# Patient Record
Sex: Female | Born: 1970 | Race: White | Hispanic: No | Marital: Single | Smoking: Former smoker
Health system: Southern US, Community
[De-identification: ages and names within clinical notes are randomized; demographics above are authoritative.]

## PROBLEM LIST (undated history)

## (undated) DIAGNOSIS — J9621 Acute and chronic respiratory failure with hypoxia: Secondary | ICD-10-CM

## (undated) DIAGNOSIS — G8254 Quadriplegia, C5-C7 incomplete: Secondary | ICD-10-CM

## (undated) DIAGNOSIS — F1920 Other psychoactive substance dependence, uncomplicated: Secondary | ICD-10-CM

## (undated) DIAGNOSIS — L89300 Pressure ulcer of unspecified buttock, unstageable: Secondary | ICD-10-CM

## (undated) DIAGNOSIS — Z9189 Other specified personal risk factors, not elsewhere classified: Secondary | ICD-10-CM

## (undated) DIAGNOSIS — F32A Depression, unspecified: Secondary | ICD-10-CM

## (undated) DIAGNOSIS — F1490 Cocaine use, unspecified, uncomplicated: Secondary | ICD-10-CM

## (undated) DIAGNOSIS — F1120 Opioid dependence, uncomplicated: Secondary | ICD-10-CM

## (undated) DIAGNOSIS — Z930 Tracheostomy status: Secondary | ICD-10-CM

## (undated) DIAGNOSIS — L89154 Pressure ulcer of sacral region, stage 4: Secondary | ICD-10-CM

## (undated) DIAGNOSIS — R601 Generalized edema: Secondary | ICD-10-CM

## (undated) DIAGNOSIS — K219 Gastro-esophageal reflux disease without esophagitis: Secondary | ICD-10-CM

## (undated) DIAGNOSIS — Z950 Presence of cardiac pacemaker: Secondary | ICD-10-CM

## (undated) DIAGNOSIS — R091 Pleurisy: Secondary | ICD-10-CM

## (undated) DIAGNOSIS — K759 Inflammatory liver disease, unspecified: Secondary | ICD-10-CM

## (undated) DIAGNOSIS — G8250 Quadriplegia, unspecified: Secondary | ICD-10-CM

## (undated) DIAGNOSIS — F329 Major depressive disorder, single episode, unspecified: Secondary | ICD-10-CM

## (undated) DIAGNOSIS — E46 Unspecified protein-calorie malnutrition: Secondary | ICD-10-CM

## (undated) DIAGNOSIS — J181 Lobar pneumonia, unspecified organism: Secondary | ICD-10-CM

## (undated) DIAGNOSIS — E119 Type 2 diabetes mellitus without complications: Secondary | ICD-10-CM

## (undated) DIAGNOSIS — J189 Pneumonia, unspecified organism: Secondary | ICD-10-CM

## (undated) DIAGNOSIS — T40601A Poisoning by unspecified narcotics, accidental (unintentional), initial encounter: Secondary | ICD-10-CM

## (undated) DIAGNOSIS — N200 Calculus of kidney: Secondary | ICD-10-CM

## (undated) HISTORY — DX: Poisoning by unspecified narcotics, accidental (unintentional), initial encounter: T40.601A

## (undated) HISTORY — PX: TRACHEOSTOMY: SUR1362

## (undated) HISTORY — DX: Cocaine use, unspecified, uncomplicated: F14.90

## (undated) HISTORY — DX: Calculus of kidney: N20.0

## (undated) HISTORY — DX: Opioid dependence, uncomplicated: F11.20

## (undated) HISTORY — DX: Generalized edema: R60.1

## (undated) HISTORY — PX: APPENDECTOMY: SHX54

## (undated) HISTORY — PX: CARDIAC SURGERY: SHX584

## (undated) HISTORY — DX: Other psychoactive substance dependence, uncomplicated: F19.20

---

## 1997-10-05 ENCOUNTER — Other Ambulatory Visit: Admission: RE | Admit: 1997-10-05 | Discharge: 1997-10-05 | Payer: Self-pay | Admitting: Gynecology

## 1998-10-07 ENCOUNTER — Inpatient Hospital Stay (HOSPITAL_COMMUNITY): Admission: EM | Admit: 1998-10-07 | Discharge: 1998-10-09 | Payer: Self-pay

## 1998-10-08 ENCOUNTER — Encounter: Payer: Self-pay | Admitting: Internal Medicine

## 1998-12-27 ENCOUNTER — Other Ambulatory Visit: Admission: RE | Admit: 1998-12-27 | Discharge: 1998-12-27 | Payer: Self-pay | Admitting: Gynecology

## 1999-12-23 ENCOUNTER — Other Ambulatory Visit: Admission: RE | Admit: 1999-12-23 | Discharge: 1999-12-23 | Payer: Self-pay | Admitting: Gynecology

## 2000-01-06 ENCOUNTER — Emergency Department (HOSPITAL_COMMUNITY): Admission: EM | Admit: 2000-01-06 | Discharge: 2000-01-06 | Payer: Self-pay | Admitting: Emergency Medicine

## 2000-01-06 ENCOUNTER — Encounter: Payer: Self-pay | Admitting: Emergency Medicine

## 2000-09-09 ENCOUNTER — Inpatient Hospital Stay (HOSPITAL_COMMUNITY): Admission: AD | Admit: 2000-09-09 | Discharge: 2000-09-09 | Payer: Self-pay | Admitting: Obstetrics and Gynecology

## 2000-10-05 ENCOUNTER — Inpatient Hospital Stay (HOSPITAL_COMMUNITY): Admission: AD | Admit: 2000-10-05 | Discharge: 2000-10-05 | Payer: Self-pay | Admitting: Obstetrics and Gynecology

## 2000-10-23 ENCOUNTER — Emergency Department (HOSPITAL_COMMUNITY): Admission: EM | Admit: 2000-10-23 | Discharge: 2000-10-23 | Payer: Self-pay | Admitting: Emergency Medicine

## 2000-11-23 ENCOUNTER — Emergency Department (HOSPITAL_COMMUNITY): Admission: EM | Admit: 2000-11-23 | Discharge: 2000-11-23 | Payer: Self-pay | Admitting: Emergency Medicine

## 2000-12-29 ENCOUNTER — Inpatient Hospital Stay (HOSPITAL_COMMUNITY): Admission: AD | Admit: 2000-12-29 | Discharge: 2001-01-01 | Payer: Self-pay | Admitting: Obstetrics & Gynecology

## 2001-02-09 ENCOUNTER — Other Ambulatory Visit: Admission: RE | Admit: 2001-02-09 | Discharge: 2001-02-09 | Payer: Self-pay | Admitting: Obstetrics & Gynecology

## 2001-03-30 ENCOUNTER — Emergency Department (HOSPITAL_COMMUNITY): Admission: EM | Admit: 2001-03-30 | Discharge: 2001-03-30 | Payer: Self-pay

## 2001-07-29 ENCOUNTER — Encounter: Payer: Self-pay | Admitting: Emergency Medicine

## 2001-07-29 ENCOUNTER — Emergency Department (HOSPITAL_COMMUNITY): Admission: EM | Admit: 2001-07-29 | Discharge: 2001-07-30 | Payer: Self-pay | Admitting: Emergency Medicine

## 2001-08-27 ENCOUNTER — Emergency Department (HOSPITAL_COMMUNITY): Admission: EM | Admit: 2001-08-27 | Discharge: 2001-08-27 | Payer: Self-pay | Admitting: Emergency Medicine

## 2001-09-29 ENCOUNTER — Emergency Department (HOSPITAL_COMMUNITY): Admission: EM | Admit: 2001-09-29 | Discharge: 2001-09-29 | Payer: Self-pay | Admitting: Emergency Medicine

## 2001-11-20 ENCOUNTER — Encounter: Payer: Self-pay | Admitting: Emergency Medicine

## 2001-11-20 ENCOUNTER — Emergency Department (HOSPITAL_COMMUNITY): Admission: EM | Admit: 2001-11-20 | Discharge: 2001-11-20 | Payer: Self-pay | Admitting: Emergency Medicine

## 2002-01-16 ENCOUNTER — Emergency Department (HOSPITAL_COMMUNITY): Admission: EM | Admit: 2002-01-16 | Discharge: 2002-01-16 | Payer: Self-pay | Admitting: Emergency Medicine

## 2002-01-26 ENCOUNTER — Emergency Department (HOSPITAL_COMMUNITY): Admission: EM | Admit: 2002-01-26 | Discharge: 2002-01-26 | Payer: Self-pay | Admitting: Emergency Medicine

## 2002-02-21 ENCOUNTER — Emergency Department (HOSPITAL_COMMUNITY): Admission: EM | Admit: 2002-02-21 | Discharge: 2002-02-21 | Payer: Self-pay | Admitting: Emergency Medicine

## 2002-03-24 ENCOUNTER — Encounter: Payer: Self-pay | Admitting: Emergency Medicine

## 2002-03-24 ENCOUNTER — Emergency Department (HOSPITAL_COMMUNITY): Admission: EM | Admit: 2002-03-24 | Discharge: 2002-03-24 | Payer: Self-pay | Admitting: Emergency Medicine

## 2002-05-22 ENCOUNTER — Emergency Department (HOSPITAL_COMMUNITY): Admission: EM | Admit: 2002-05-22 | Discharge: 2002-05-22 | Payer: Self-pay | Admitting: Emergency Medicine

## 2002-05-22 ENCOUNTER — Encounter: Payer: Self-pay | Admitting: Emergency Medicine

## 2002-06-17 ENCOUNTER — Emergency Department (HOSPITAL_COMMUNITY): Admission: EM | Admit: 2002-06-17 | Discharge: 2002-06-17 | Payer: Self-pay | Admitting: Emergency Medicine

## 2002-08-07 ENCOUNTER — Encounter: Payer: Self-pay | Admitting: Emergency Medicine

## 2002-08-07 ENCOUNTER — Emergency Department (HOSPITAL_COMMUNITY): Admission: EM | Admit: 2002-08-07 | Discharge: 2002-08-07 | Payer: Self-pay | Admitting: Emergency Medicine

## 2002-10-16 ENCOUNTER — Emergency Department (HOSPITAL_COMMUNITY): Admission: EM | Admit: 2002-10-16 | Discharge: 2002-10-16 | Payer: Self-pay | Admitting: Emergency Medicine

## 2002-12-28 ENCOUNTER — Emergency Department (HOSPITAL_COMMUNITY): Admission: EM | Admit: 2002-12-28 | Discharge: 2002-12-29 | Payer: Self-pay | Admitting: Emergency Medicine

## 2003-01-27 ENCOUNTER — Emergency Department (HOSPITAL_COMMUNITY): Admission: AD | Admit: 2003-01-27 | Discharge: 2003-01-27 | Payer: Self-pay | Admitting: Family Medicine

## 2003-02-02 ENCOUNTER — Emergency Department (HOSPITAL_COMMUNITY): Admission: AD | Admit: 2003-02-02 | Discharge: 2003-02-02 | Payer: Self-pay | Admitting: Family Medicine

## 2003-03-30 ENCOUNTER — Emergency Department (HOSPITAL_COMMUNITY): Admission: EM | Admit: 2003-03-30 | Discharge: 2003-03-30 | Payer: Self-pay | Admitting: Emergency Medicine

## 2003-04-05 ENCOUNTER — Emergency Department (HOSPITAL_COMMUNITY): Admission: EM | Admit: 2003-04-05 | Discharge: 2003-04-05 | Payer: Self-pay | Admitting: Family Medicine

## 2003-05-04 ENCOUNTER — Emergency Department (HOSPITAL_COMMUNITY): Admission: EM | Admit: 2003-05-04 | Discharge: 2003-05-04 | Payer: Self-pay | Admitting: Family Medicine

## 2003-05-14 ENCOUNTER — Emergency Department (HOSPITAL_COMMUNITY): Admission: EM | Admit: 2003-05-14 | Discharge: 2003-05-14 | Payer: Self-pay | Admitting: Emergency Medicine

## 2003-05-28 ENCOUNTER — Emergency Department (HOSPITAL_COMMUNITY): Admission: EM | Admit: 2003-05-28 | Discharge: 2003-05-28 | Payer: Self-pay | Admitting: Family Medicine

## 2003-05-31 ENCOUNTER — Inpatient Hospital Stay (HOSPITAL_COMMUNITY): Admission: EM | Admit: 2003-05-31 | Discharge: 2003-06-09 | Payer: Self-pay | Admitting: Emergency Medicine

## 2003-06-20 ENCOUNTER — Emergency Department (HOSPITAL_COMMUNITY): Admission: EM | Admit: 2003-06-20 | Discharge: 2003-06-20 | Payer: Self-pay | Admitting: Emergency Medicine

## 2003-06-27 ENCOUNTER — Emergency Department (HOSPITAL_COMMUNITY): Admission: EM | Admit: 2003-06-27 | Discharge: 2003-06-27 | Payer: Self-pay | Admitting: Emergency Medicine

## 2003-07-10 ENCOUNTER — Emergency Department (HOSPITAL_COMMUNITY): Admission: EM | Admit: 2003-07-10 | Discharge: 2003-07-11 | Payer: Self-pay | Admitting: Emergency Medicine

## 2003-08-06 ENCOUNTER — Emergency Department (HOSPITAL_COMMUNITY): Admission: EM | Admit: 2003-08-06 | Discharge: 2003-08-06 | Payer: Self-pay | Admitting: Emergency Medicine

## 2003-09-17 ENCOUNTER — Emergency Department (HOSPITAL_COMMUNITY): Admission: EM | Admit: 2003-09-17 | Discharge: 2003-09-17 | Payer: Self-pay | Admitting: Emergency Medicine

## 2003-10-01 ENCOUNTER — Emergency Department (HOSPITAL_COMMUNITY): Admission: EM | Admit: 2003-10-01 | Discharge: 2003-10-01 | Payer: Self-pay | Admitting: Emergency Medicine

## 2004-02-07 ENCOUNTER — Emergency Department (HOSPITAL_COMMUNITY): Admission: EM | Admit: 2004-02-07 | Discharge: 2004-02-07 | Payer: Self-pay | Admitting: Emergency Medicine

## 2004-03-06 ENCOUNTER — Emergency Department (HOSPITAL_COMMUNITY): Admission: EM | Admit: 2004-03-06 | Discharge: 2004-03-06 | Payer: Self-pay | Admitting: Emergency Medicine

## 2004-04-02 ENCOUNTER — Emergency Department (HOSPITAL_COMMUNITY): Admission: EM | Admit: 2004-04-02 | Discharge: 2004-04-02 | Payer: Self-pay | Admitting: Emergency Medicine

## 2004-06-25 ENCOUNTER — Emergency Department (HOSPITAL_COMMUNITY): Admission: EM | Admit: 2004-06-25 | Discharge: 2004-06-25 | Payer: Self-pay | Admitting: Emergency Medicine

## 2004-07-01 ENCOUNTER — Emergency Department (HOSPITAL_COMMUNITY): Admission: EM | Admit: 2004-07-01 | Discharge: 2004-07-01 | Payer: Self-pay | Admitting: Emergency Medicine

## 2004-09-09 ENCOUNTER — Emergency Department (HOSPITAL_COMMUNITY): Admission: EM | Admit: 2004-09-09 | Discharge: 2004-09-09 | Payer: Self-pay | Admitting: Emergency Medicine

## 2004-10-21 ENCOUNTER — Emergency Department (HOSPITAL_COMMUNITY): Admission: EM | Admit: 2004-10-21 | Discharge: 2004-10-21 | Payer: Self-pay | Admitting: Emergency Medicine

## 2004-11-13 ENCOUNTER — Emergency Department (HOSPITAL_COMMUNITY): Admission: EM | Admit: 2004-11-13 | Discharge: 2004-11-13 | Payer: Self-pay | Admitting: Emergency Medicine

## 2005-03-20 ENCOUNTER — Emergency Department (HOSPITAL_COMMUNITY): Admission: EM | Admit: 2005-03-20 | Discharge: 2005-03-20 | Payer: Self-pay | Admitting: Emergency Medicine

## 2005-06-08 ENCOUNTER — Emergency Department (HOSPITAL_COMMUNITY): Admission: EM | Admit: 2005-06-08 | Discharge: 2005-06-08 | Payer: Self-pay | Admitting: *Deleted

## 2005-07-04 IMAGING — CR DG CHEST 2V
2 series · 2 of 2 positions shown · non-contrast
Comparison: none

CLINICAL DATA: Cough.  Smoking history.
 CHEST, TWO VIEWS 
 The heart and mediastinum are normal.  There are patchy infiltrates in both lower lobes, the right middle lobe and the lingula.  This is consistent with pneumonia.  There is no dense consolidation, collapse or effusion.  The bony structures are unremarkable.

[view not recorded (1 of 2)]
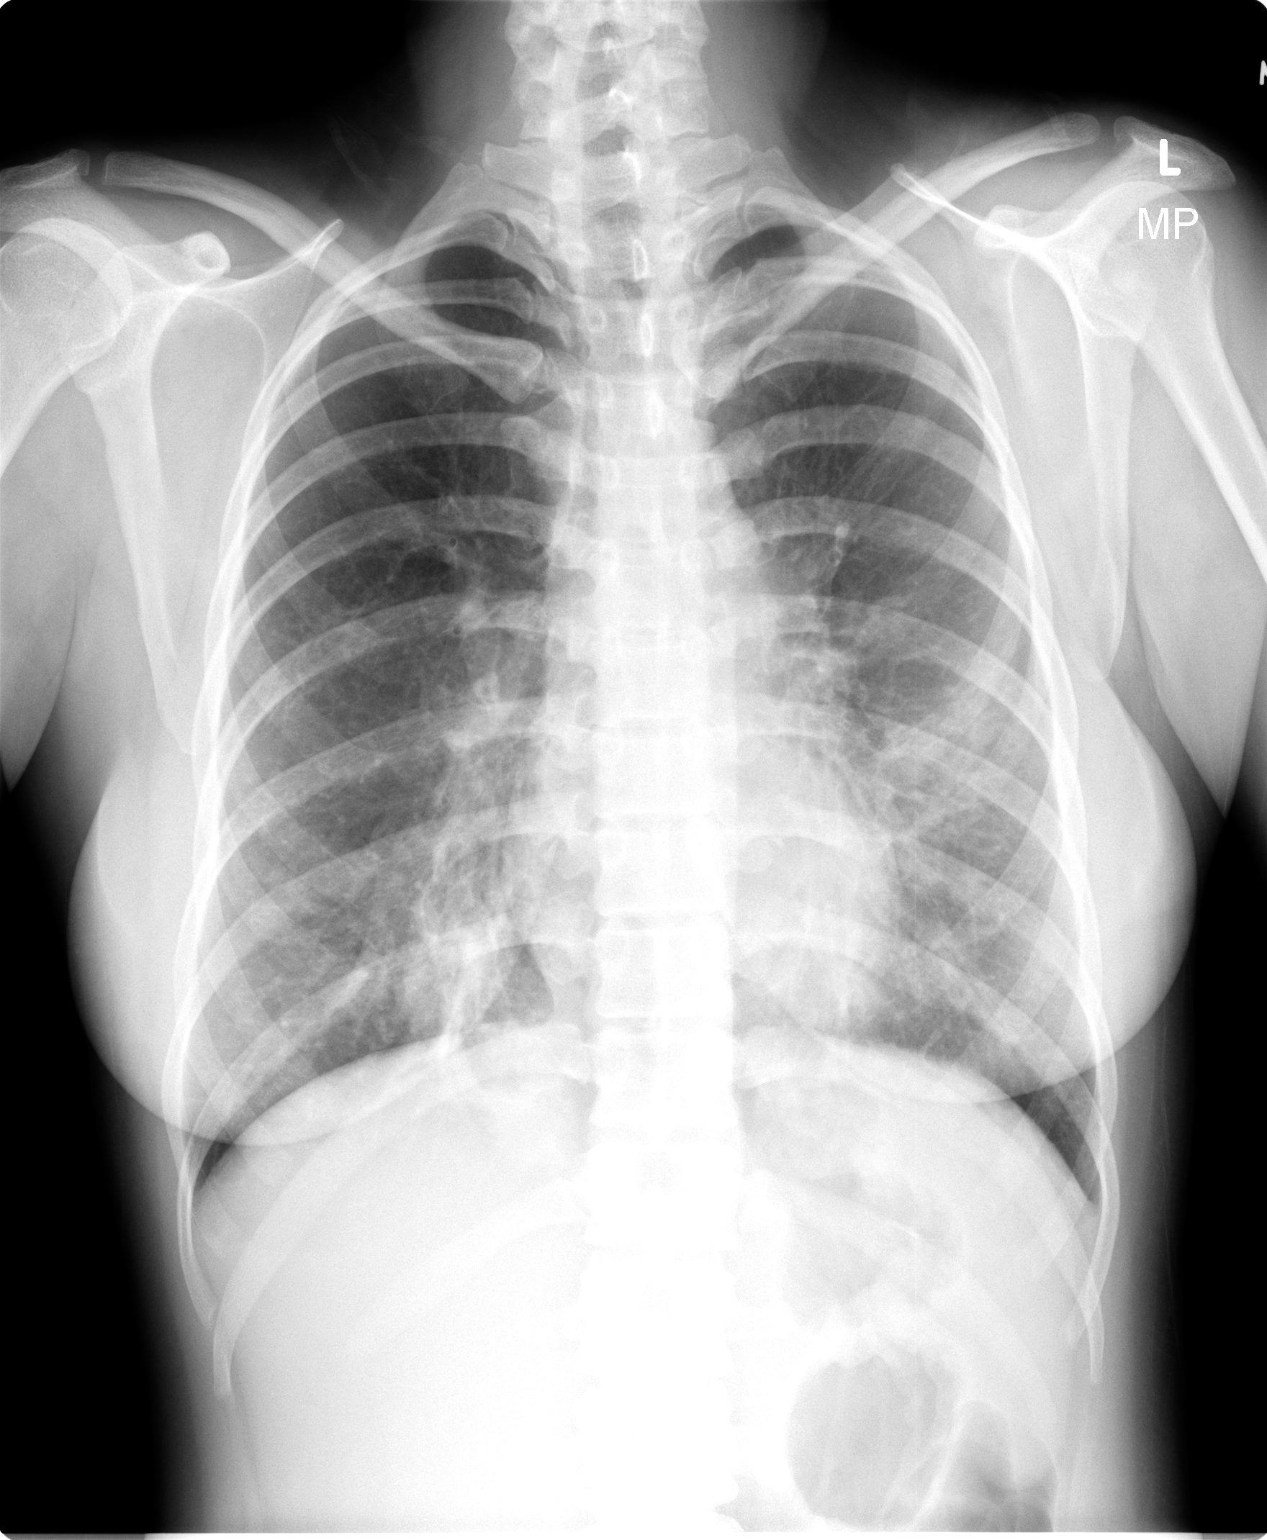

[view not recorded (2 of 2)]
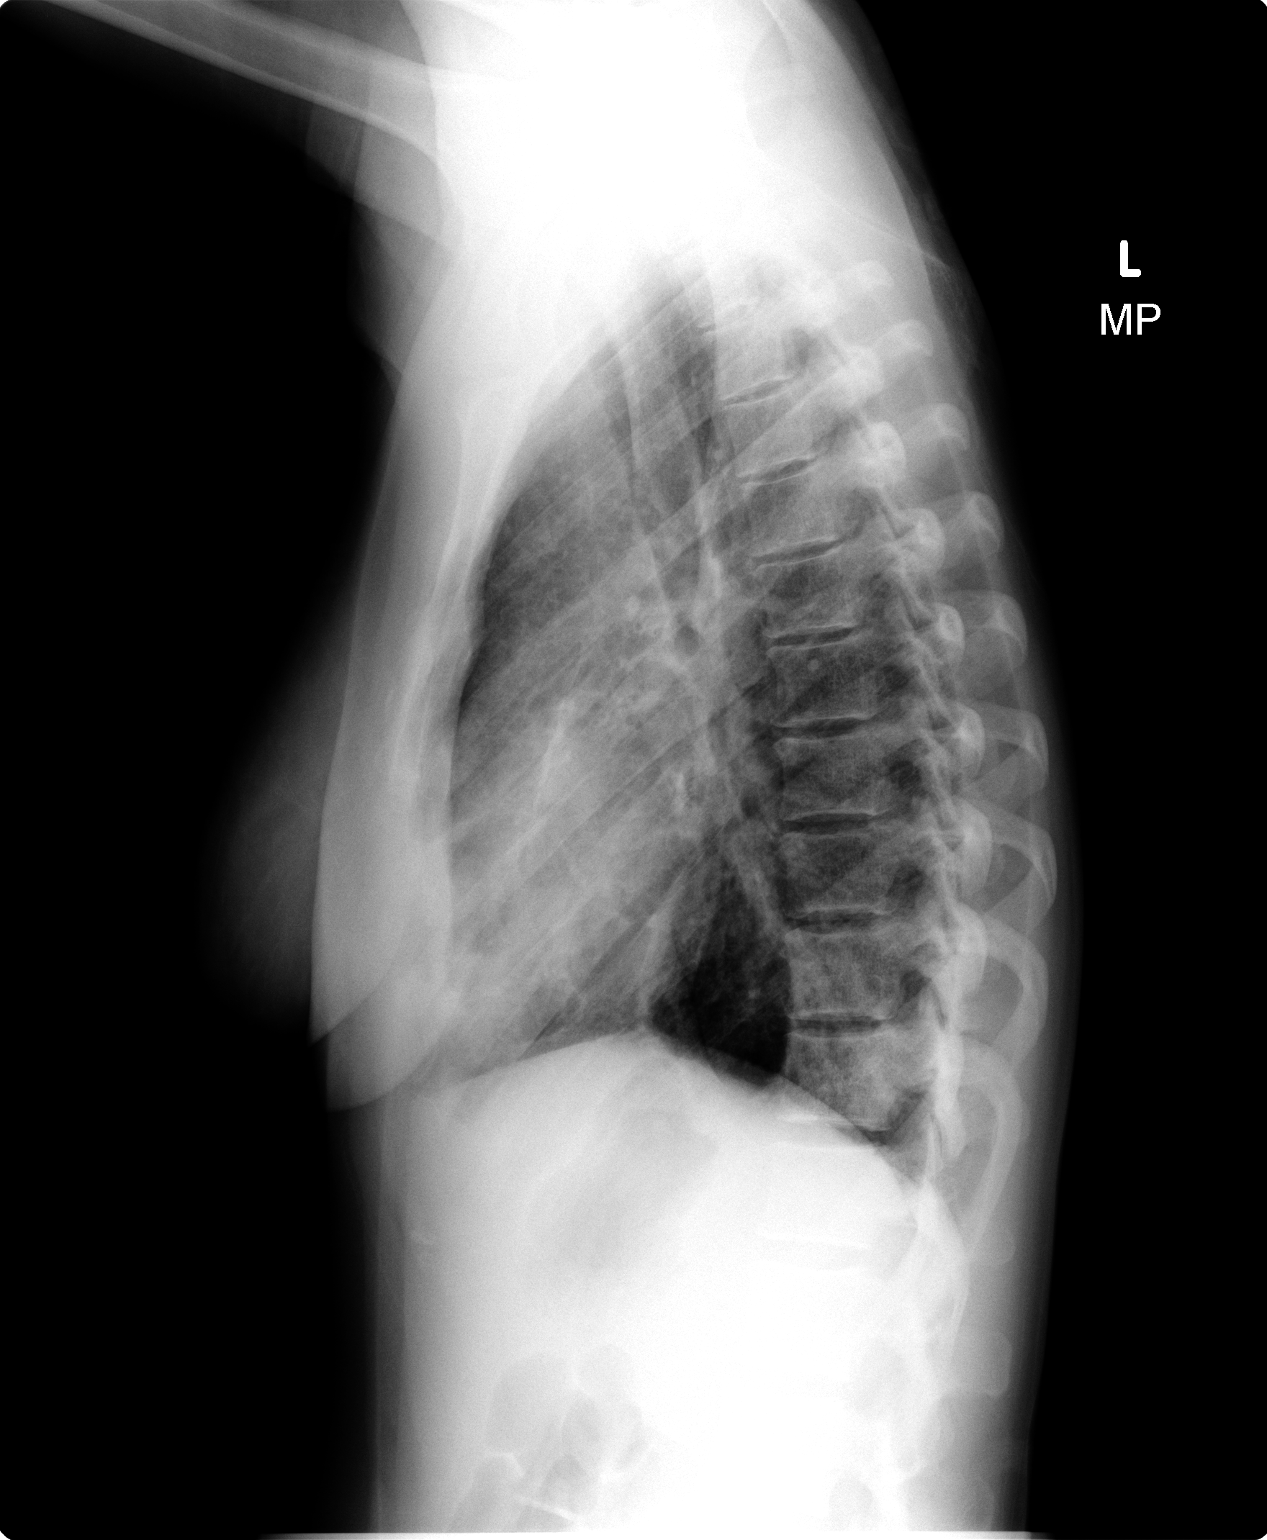

[2 of 2 positions shown; findings below may reference images not displayed]

IMPRESSION: Bilateral lower lung pneumonia.

## 2005-07-06 IMAGING — CT CT ANGIO CHEST
1 of 3 series · 19 of 32 positions shown · IV contrast (omnipaque)
Comparison: none

CLINICAL DATA: Persistent left-sided chest pain, pneumonia, and asthma.
 CHEST CT ANGIO WITH CONTRAST
 Multidetector CT imaging of the chest was performed according to the protocol for detection of pulmonary embolism during IV bolus injection of 150 ml Omnipaque 300.  Coronal and sagittal plane reformatted images were also generated.  
 The pulmonary arteries are normal throughout.  There is no bone abnormality.  There is no hilar or mediastinal adenopathy.  The upper abdomen is normal.  There are bilateral small patchy areas of infiltrate involving all lobes.  There is some atelectasis and consolidation of the medial segment of the right middle lobe and the lingula.  There is also some considerable atelectasis with some surrounding infiltrate involving the posterior aspect of the right lower lobe.  There is no pleural effusion.
 IMPRESSION
 1.  No evidence for pulmonary embolic disease.
 2.  Bilateral areas of atelectasis and infiltrate as described.

[Series 4: chest/pe 1.0 b10f · axial · 0.58mm/px · z∈[-185,+57]mm · 19 of 533 slices shown]
[im 25/533  lung]
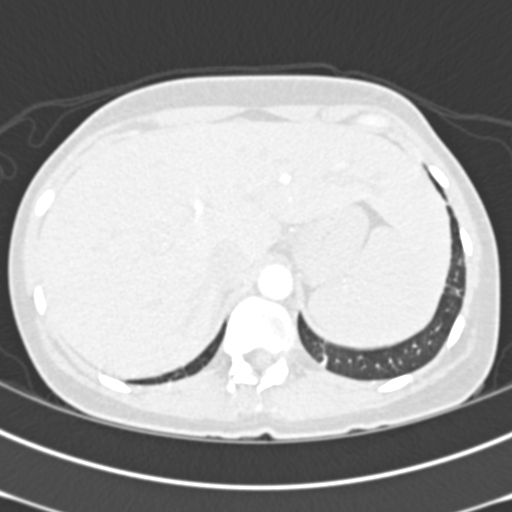
[im 49/533  mediastinal]
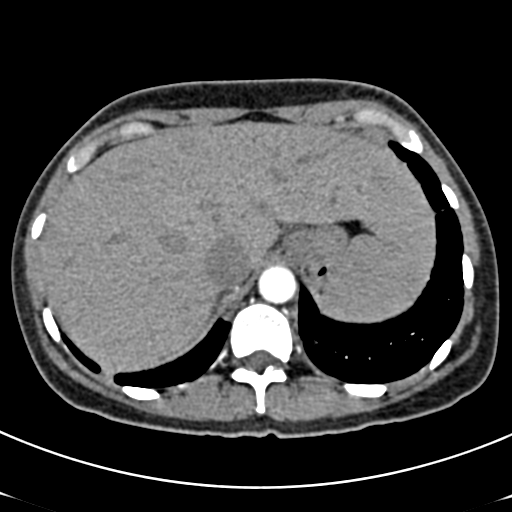
[im 97/533  lung]
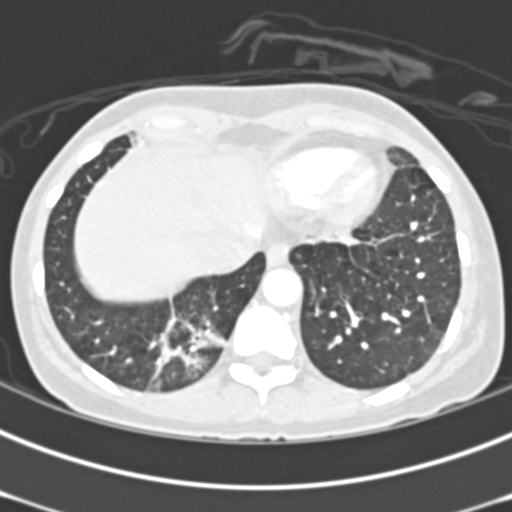
[im 121/533  mediastinal]
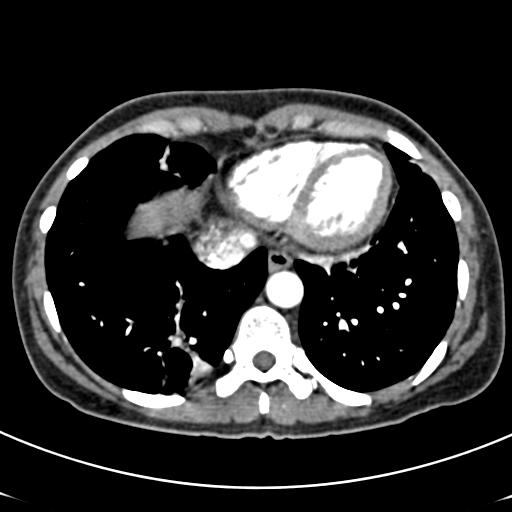
[im 146/533  lung]
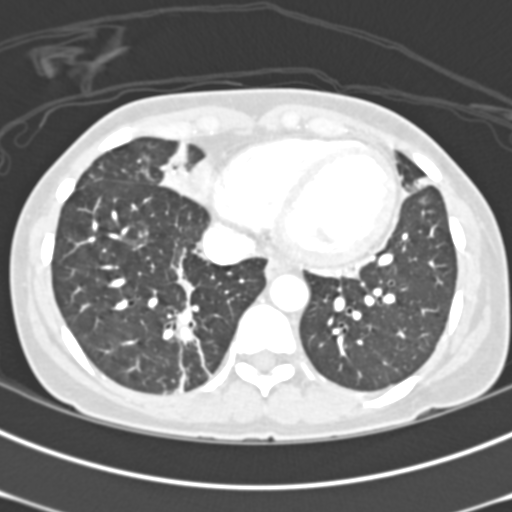
[im 170/533  mediastinal]
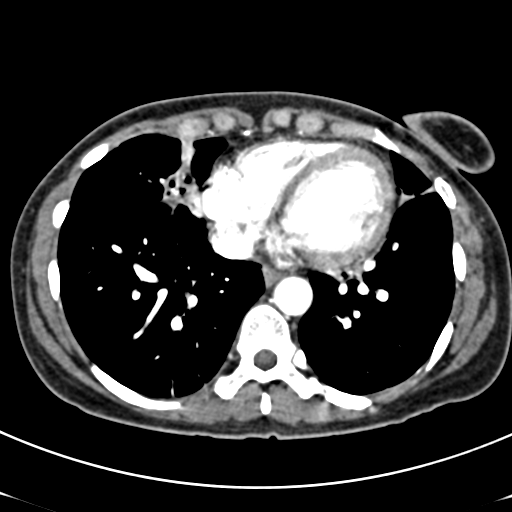
[im 194/533  lung]
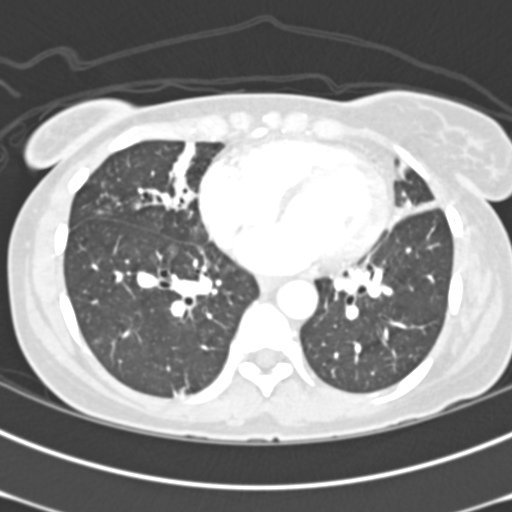
[im 209/533  mediastinal]
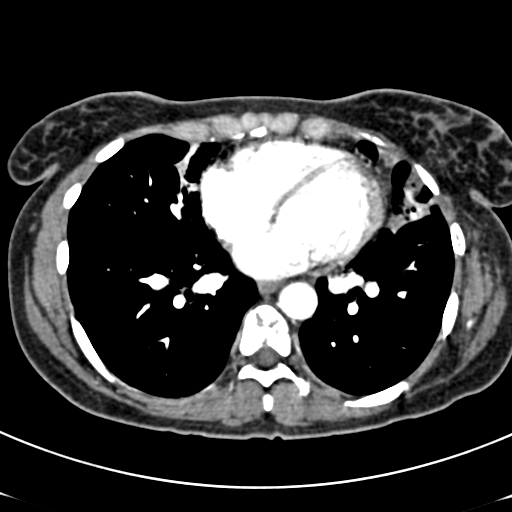
[im 218/533  lung]
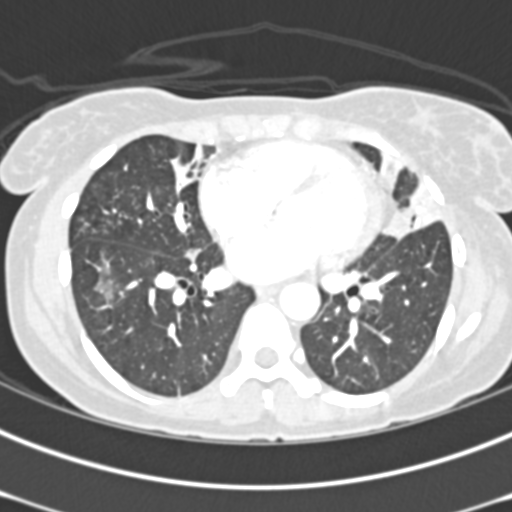
[im 267/533  mediastinal]
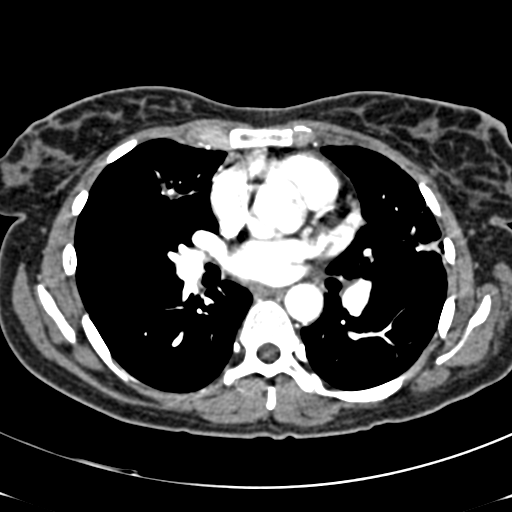
[im 291/533  lung]
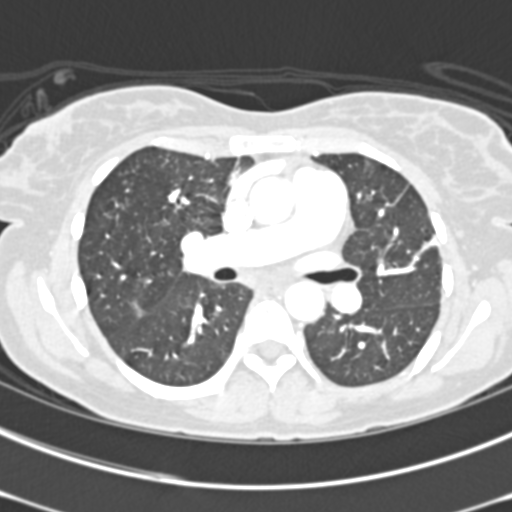
[im 315/533  mediastinal]
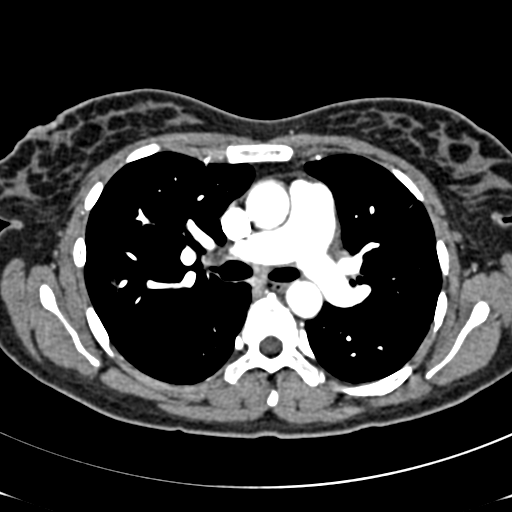
[im 339/533  lung]
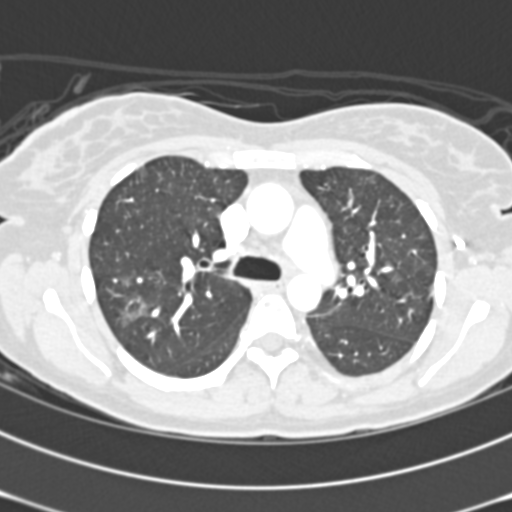
[im 363/533  mediastinal]
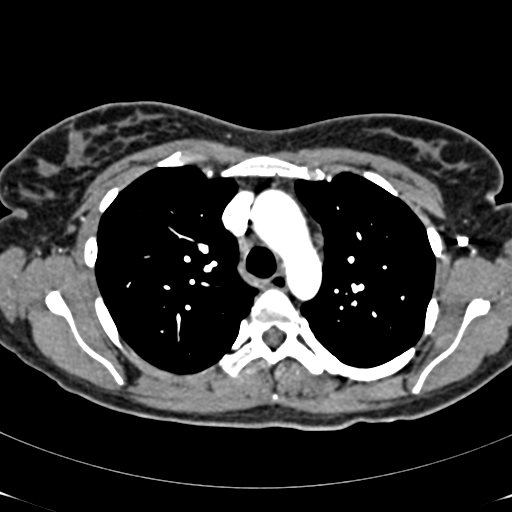
[im 387/533  lung]
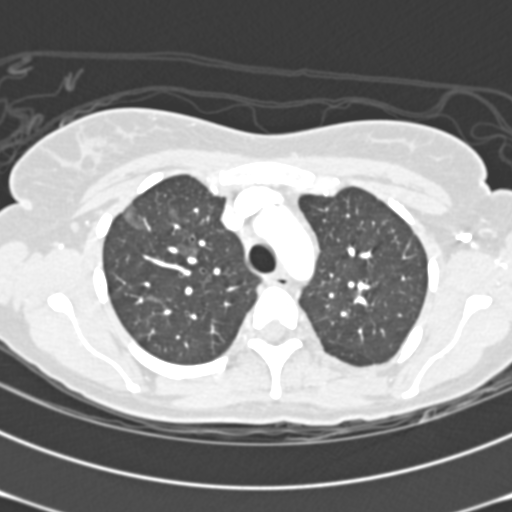
[im 412/533  mediastinal]
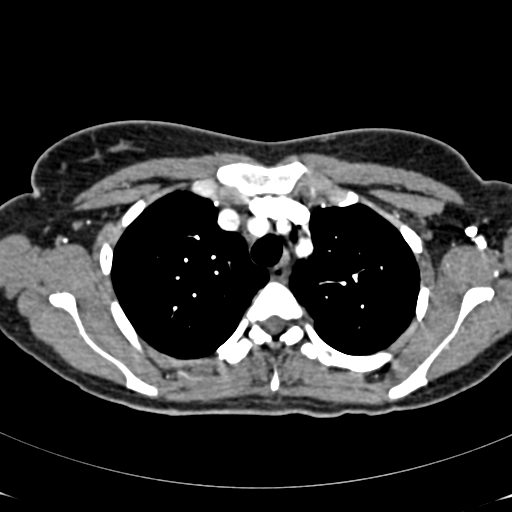
[im 436/533  lung]
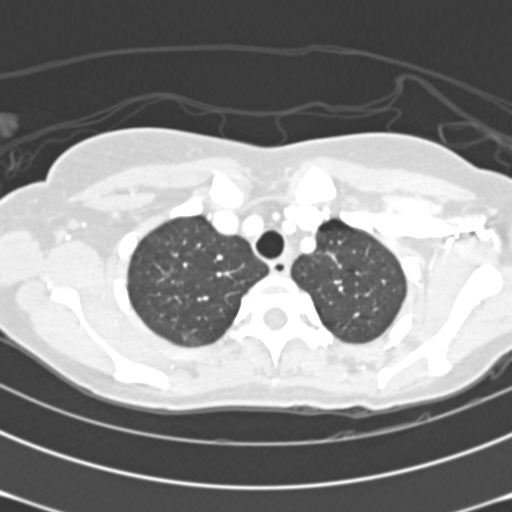
[im 484/533  mediastinal]
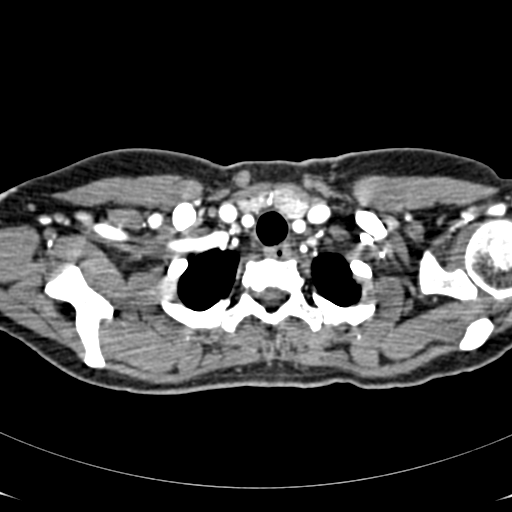
[im 508/533  lung]
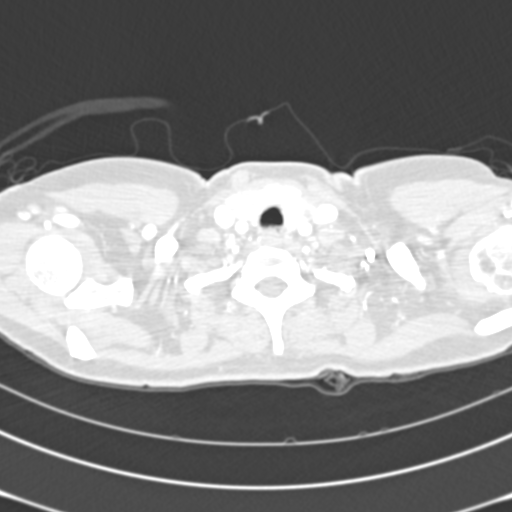

[19 of 32 positions shown; findings below may reference images not displayed]

## 2005-07-07 IMAGING — CR DG CHEST 2V
2 series · 2 of 2 positions shown · non-contrast
Comparison: CT chest yesterday and two view chest 05/31/2003
Patchy bilateral airspace opacities are unchanged from the CT yesterday, with the most confluent consolidation present in the lingula.

CLINICAL DATA: Asthma; followup pneumonia.  
CHEST (TWO VIEWS) ? 06/03/2003

[view not recorded (1 of 2)]
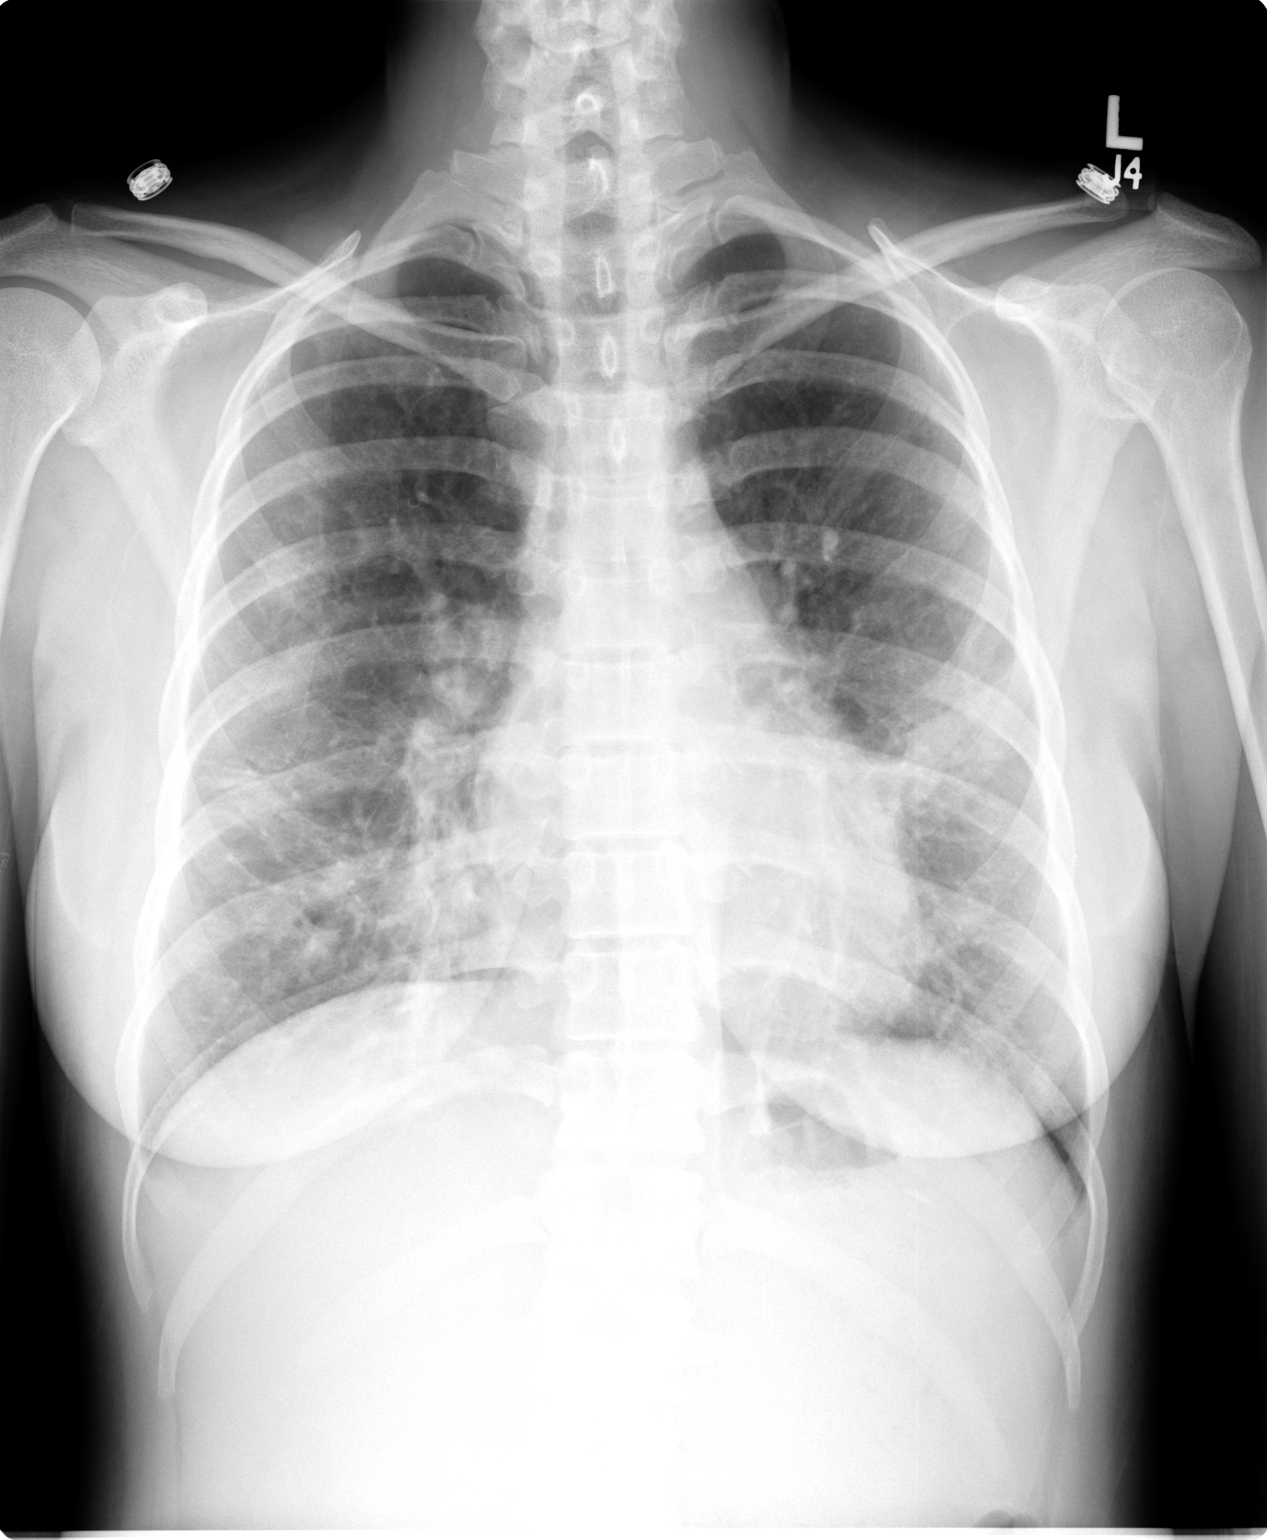

[view not recorded (2 of 2)]
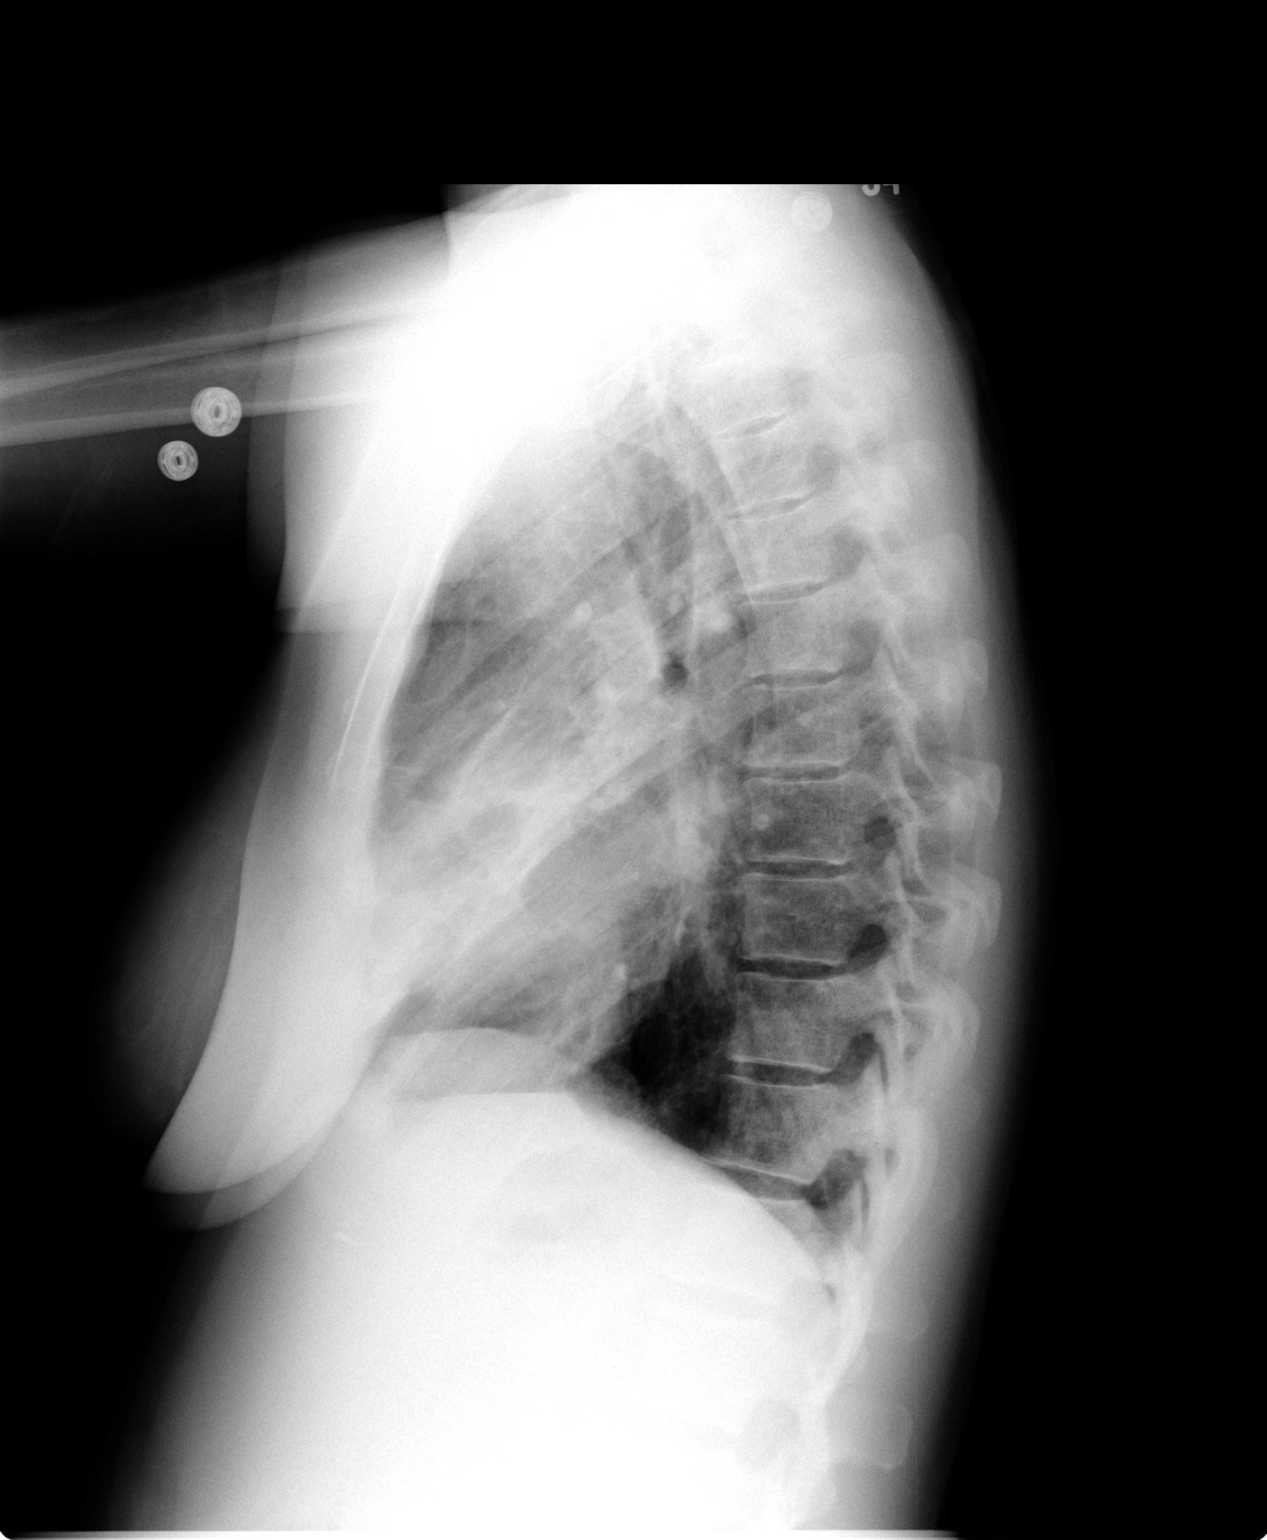

[2 of 2 positions shown; findings below may reference images not displayed]

This appearance has worsened since the two view chest x-ray three days ago.  There are no pleural effusions.  Cardiomediastinal silhouette remains normal.  The visualized bony thorax remains intact. 
IMPRESSION
Patchy pneumonia in both lungs, most confluent in the lingula, unchanged from the CT yesterday. This has worsened since the chest x-ray three days ago.

## 2005-07-10 IMAGING — CR DG CHEST 2V
2 series · 2 of 2 positions shown · non-contrast
Comparison: 06/03/03.

CLINICAL DATA: 13-year-old female with pneumonia, follow-up exam.  History of cough and fever with shortness of breath. 
 CHEST, TWO VIEWS 06/06/03

[view not recorded (1 of 2)]
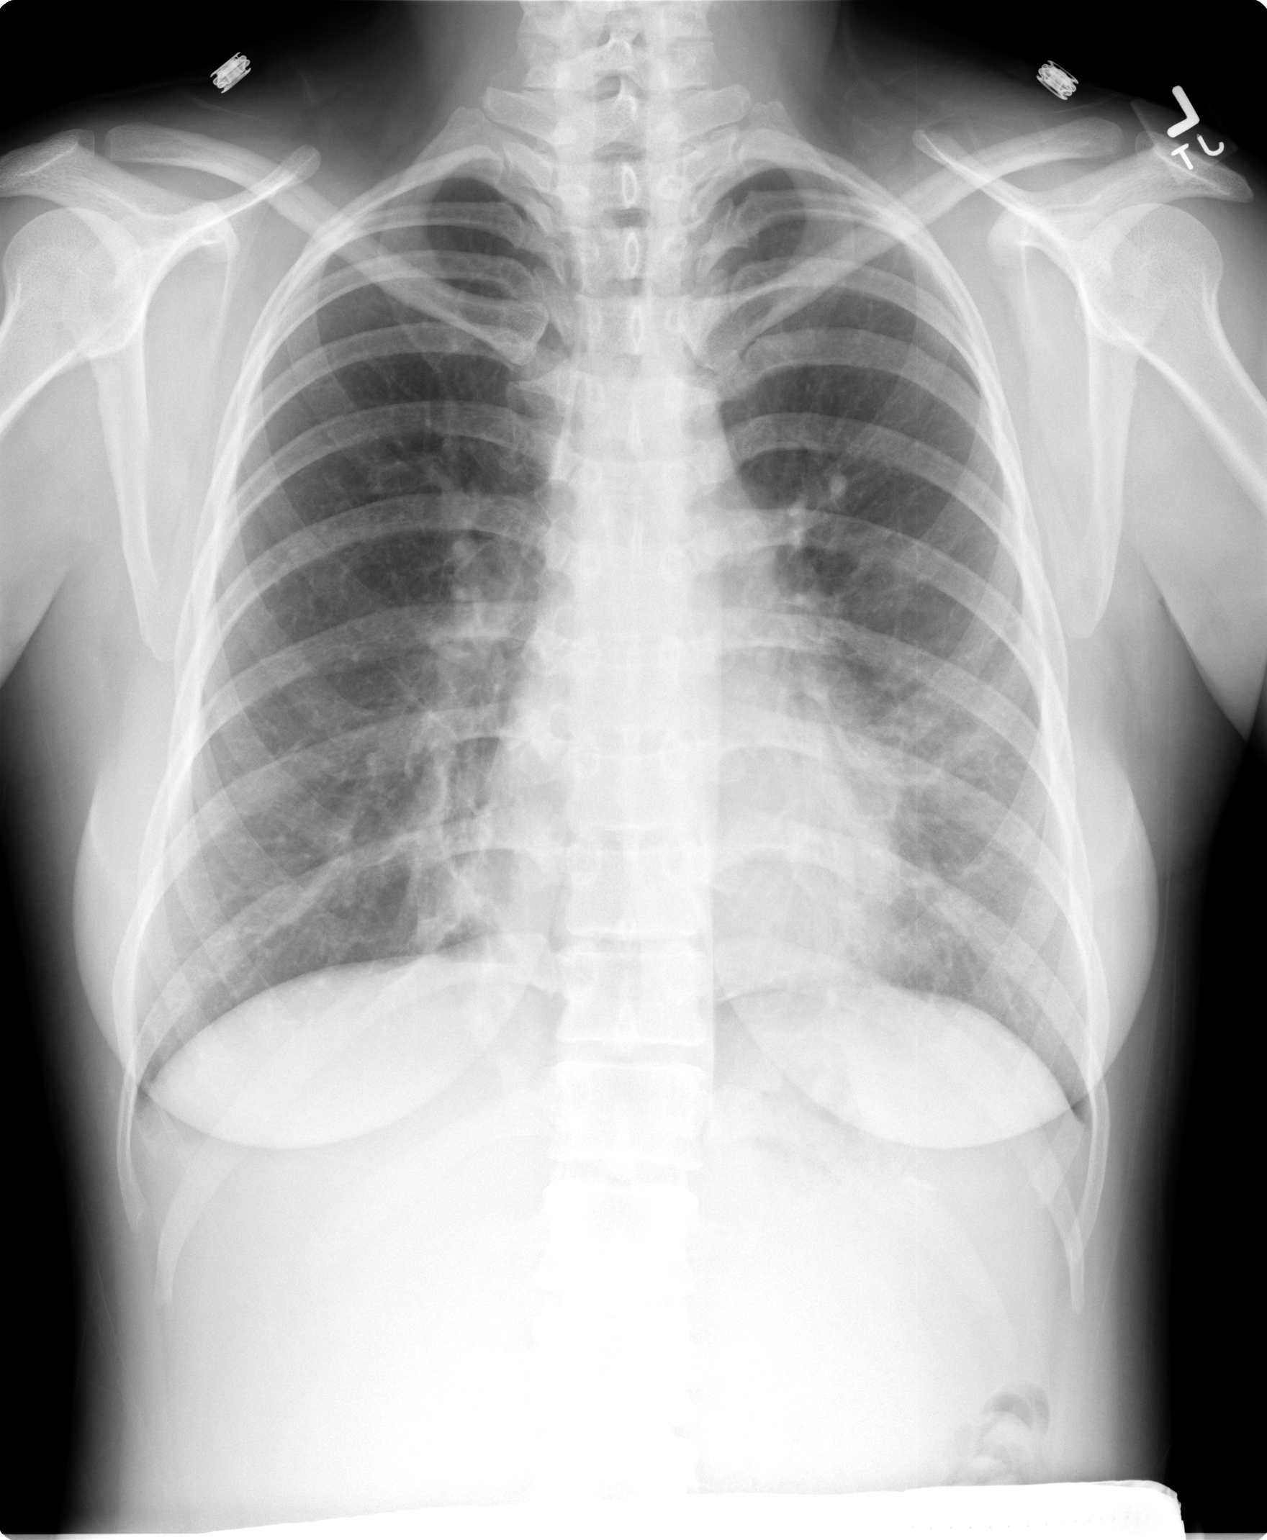

[view not recorded (2 of 2)]
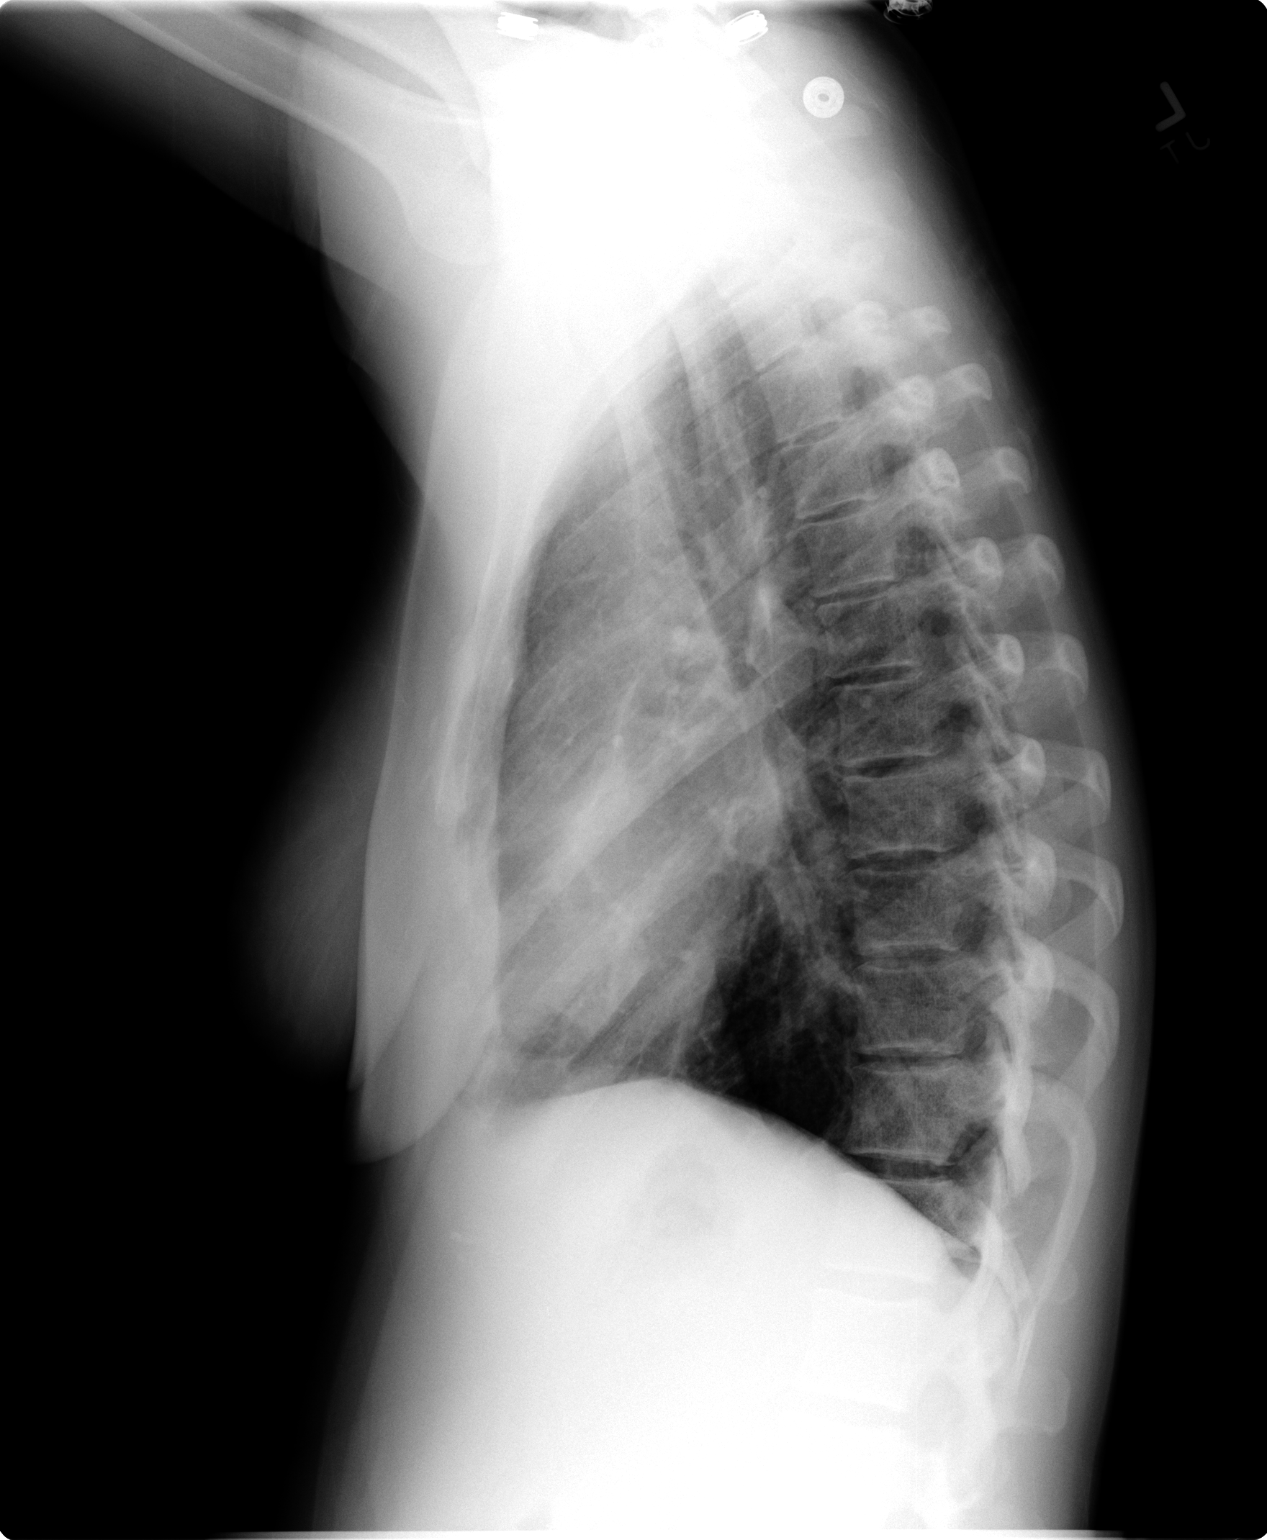

[2 of 2 positions shown; findings below may reference images not displayed]

FINDINGS: Patchy bilateral airspace disease is again noted in the perihilar regions and the medial lower lobes obscuring the cardiac contours.  Overall the exam is stable.  No effusion or pneumothorax. 
 IMPRESSION
 No significant change in bilateral perihilar and lower lobe airspace disease/pneumonia.

## 2005-07-17 ENCOUNTER — Emergency Department (HOSPITAL_COMMUNITY): Admission: EM | Admit: 2005-07-17 | Discharge: 2005-07-17 | Payer: Self-pay | Admitting: Emergency Medicine

## 2005-09-09 IMAGING — CR DG SACRUM/COCCYX 2+V
3 series · 3 of 3 positions shown · non-contrast
Comparison: none

CLINICAL DATA: Fell, with pain.
 SACRUM AND COCCYX
 Three views of the sacrum and coccyx were obtained.  No acute fracture is seen.  The sacral coccygeal elements appear to be normally aligned.  The SI joints are normal.  
 IMPRESSION 
 No fracture.

[view not recorded (1 of 3)]
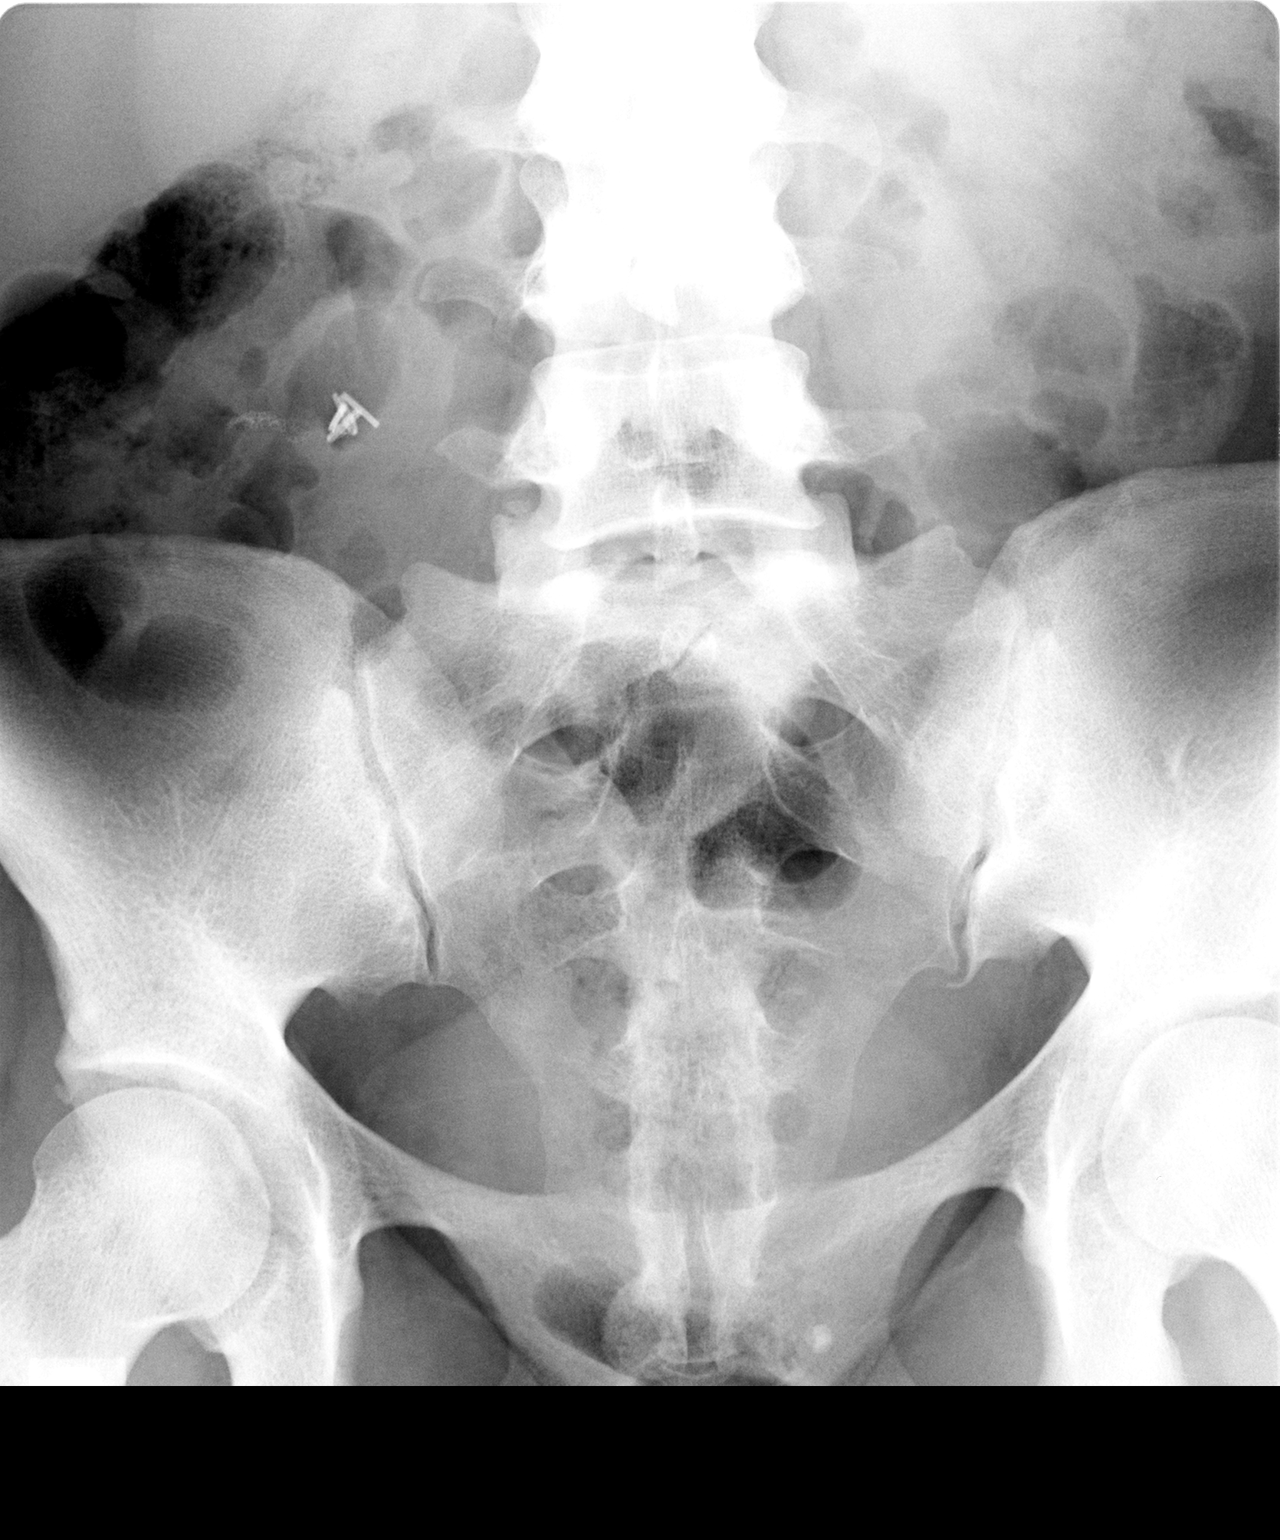

[view not recorded (2 of 3)]
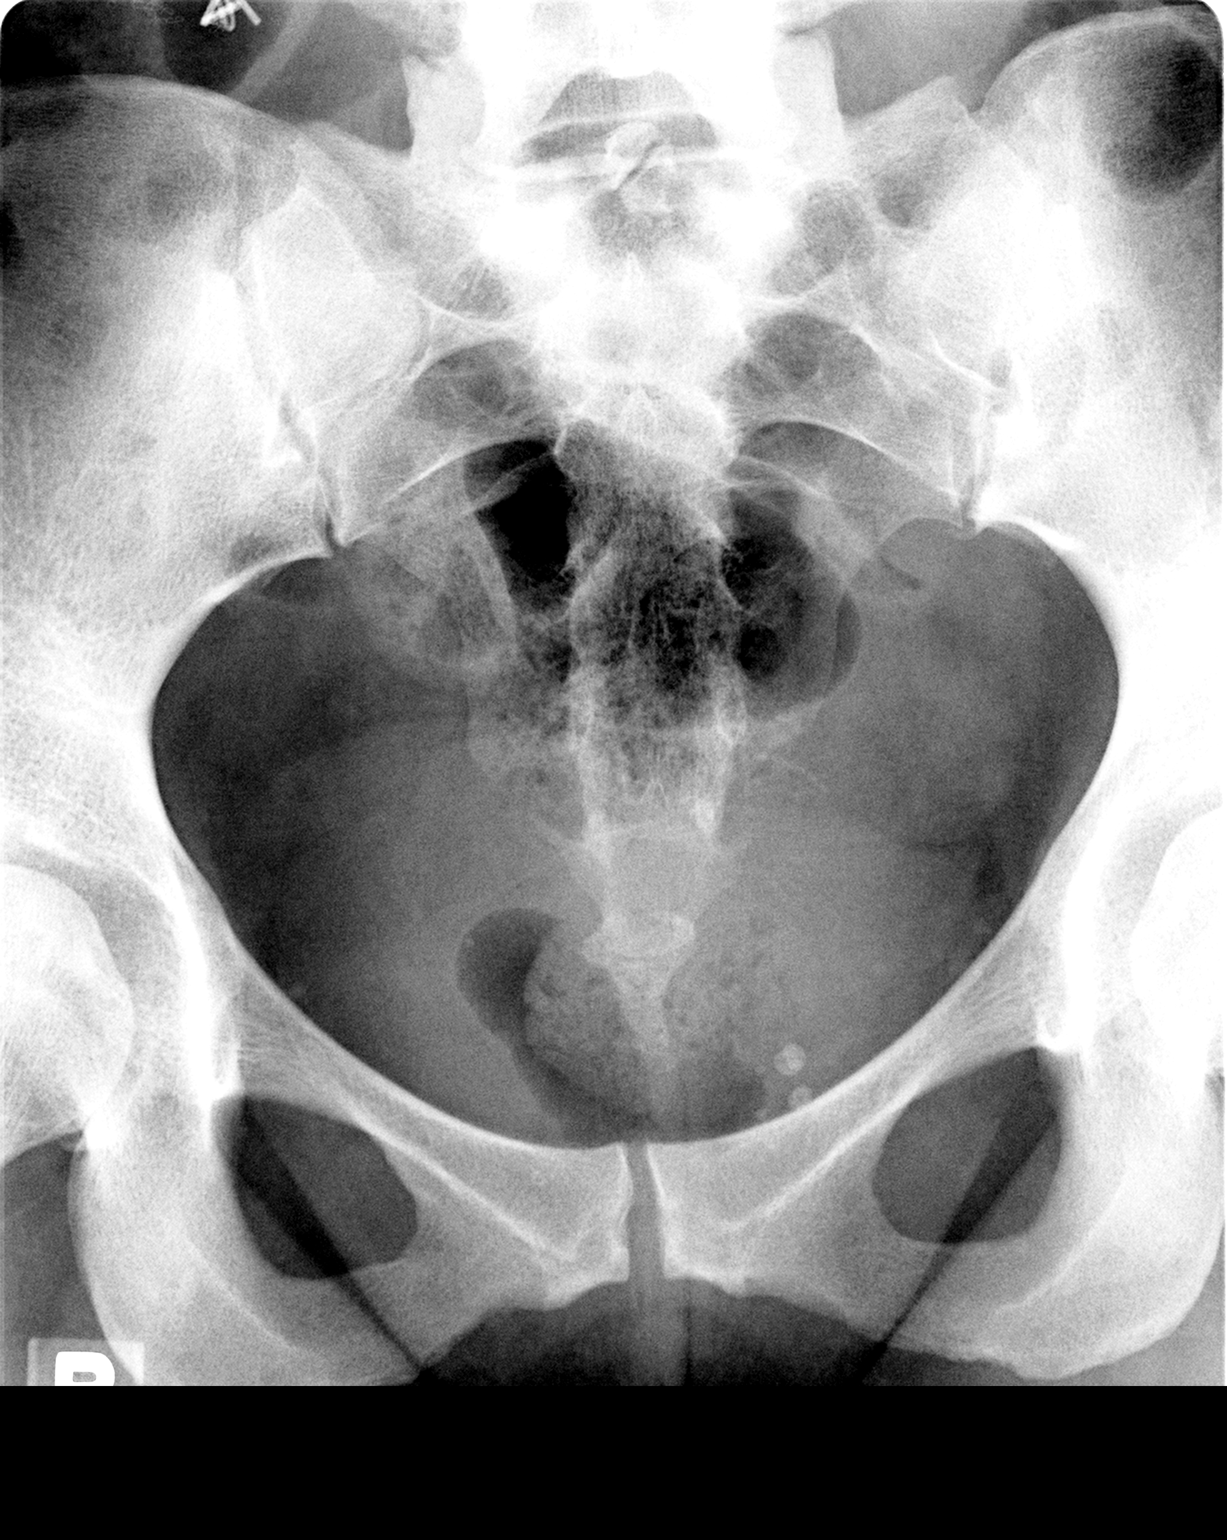

[view not recorded (3 of 3)]
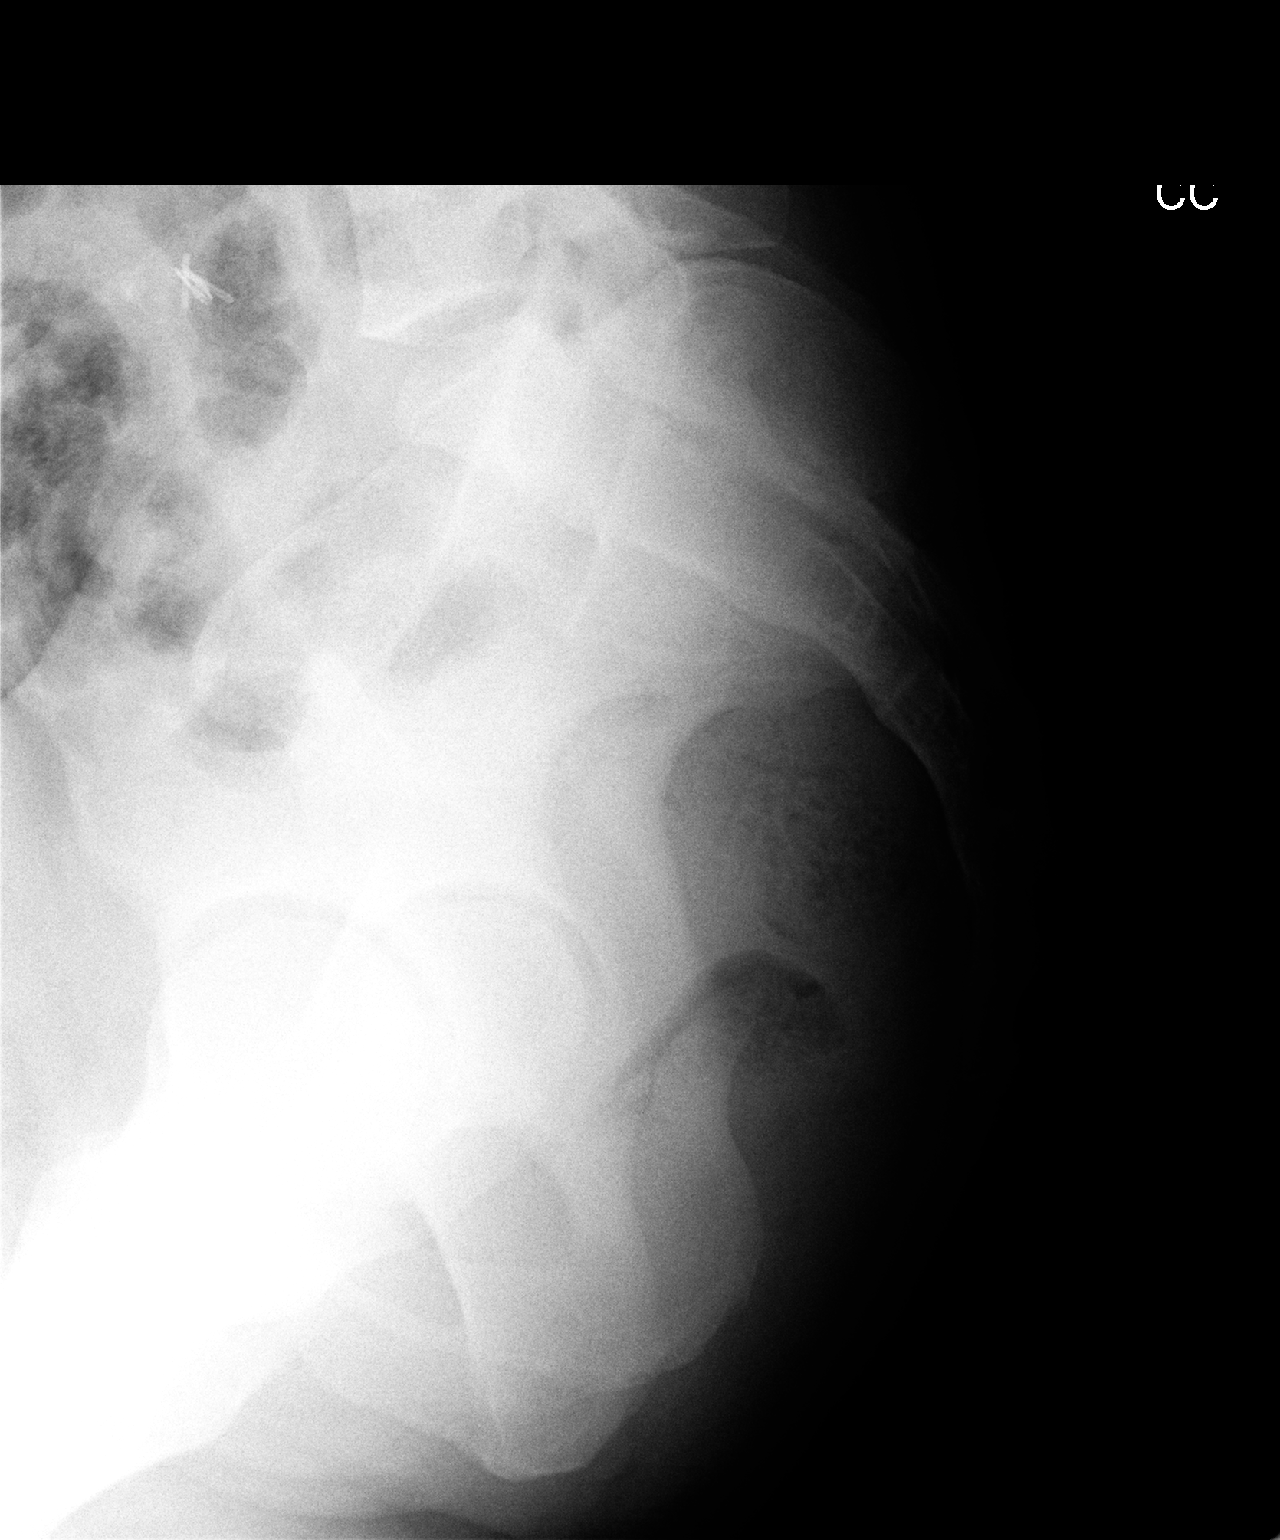

[3 of 3 positions shown; findings below may reference images not displayed]

## 2005-10-03 ENCOUNTER — Emergency Department (HOSPITAL_COMMUNITY): Admission: EM | Admit: 2005-10-03 | Discharge: 2005-10-03 | Payer: Self-pay | Admitting: Emergency Medicine

## 2005-11-04 IMAGING — CR DG CHEST 2V
2 series · 2 of 2 positions shown · non-contrast
Comparison: 06/06/03.

CLINICAL DATA: Cough and fever.  Wheezing. 
 TWO VIEW CHEST

[view not recorded (1 of 2)]
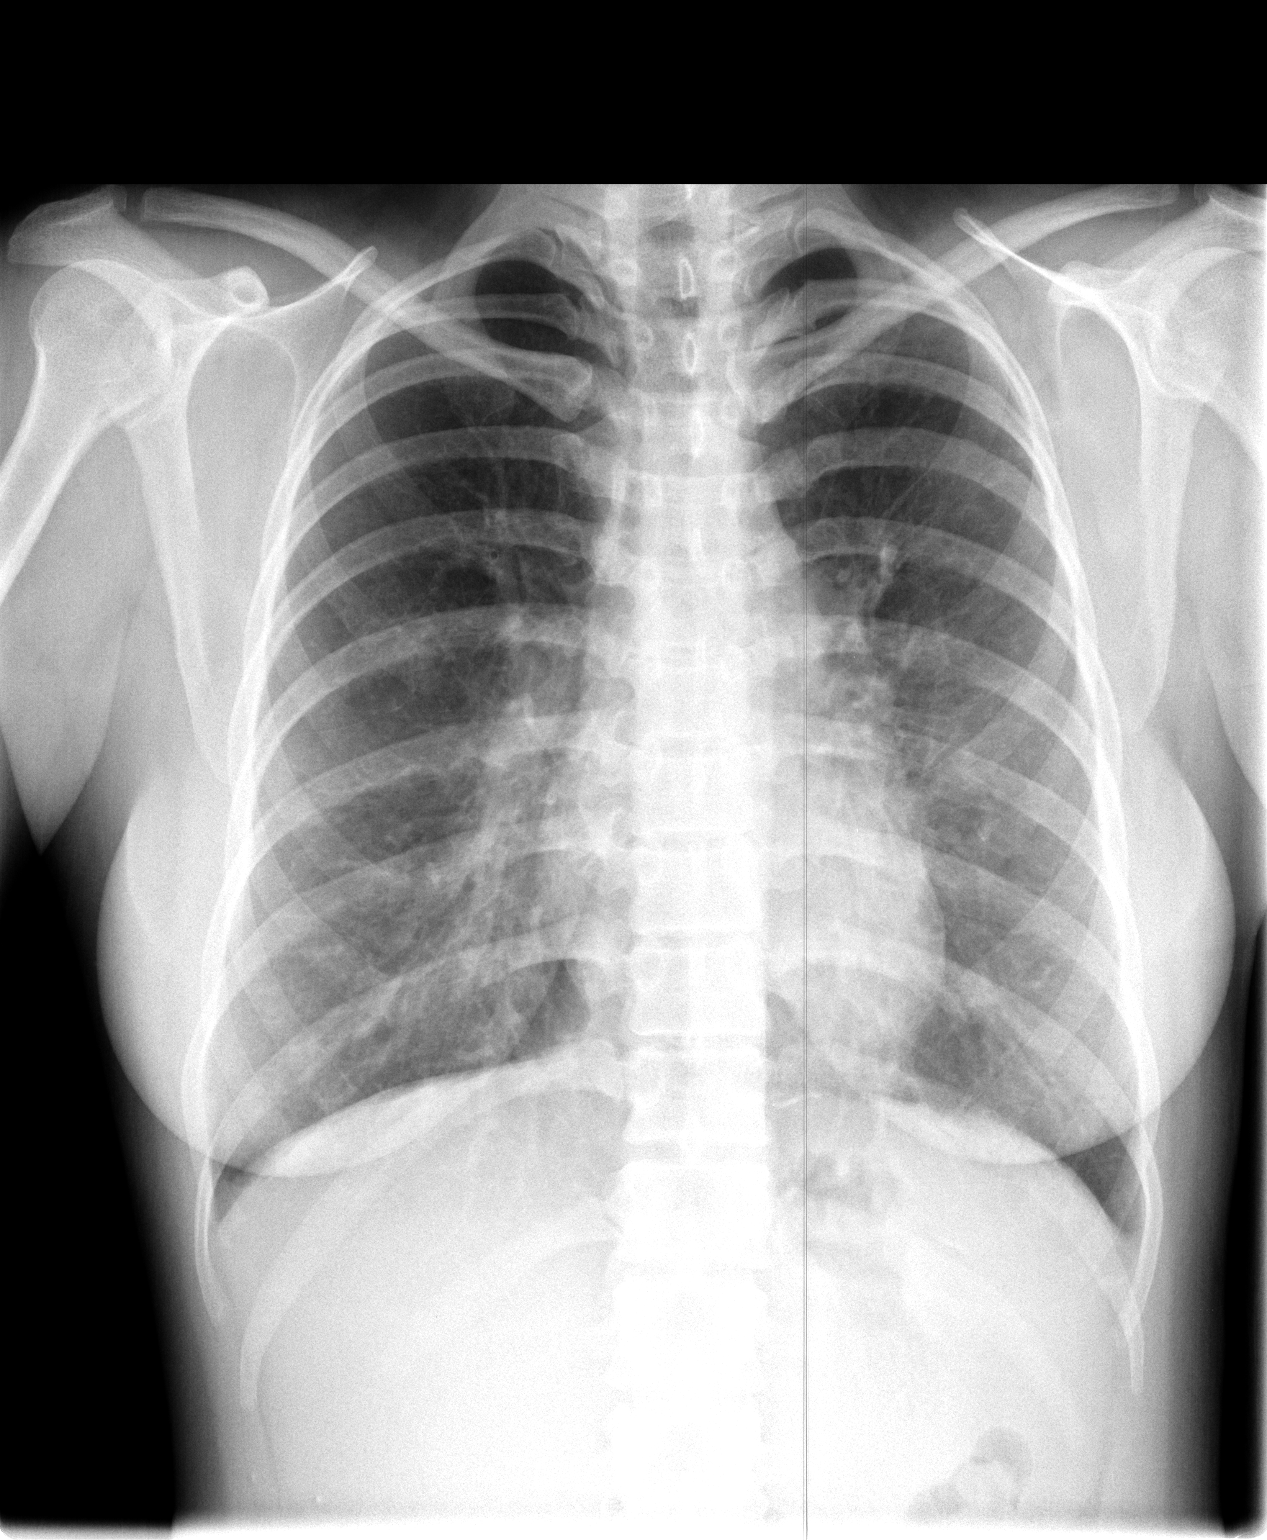

[view not recorded (2 of 2)]
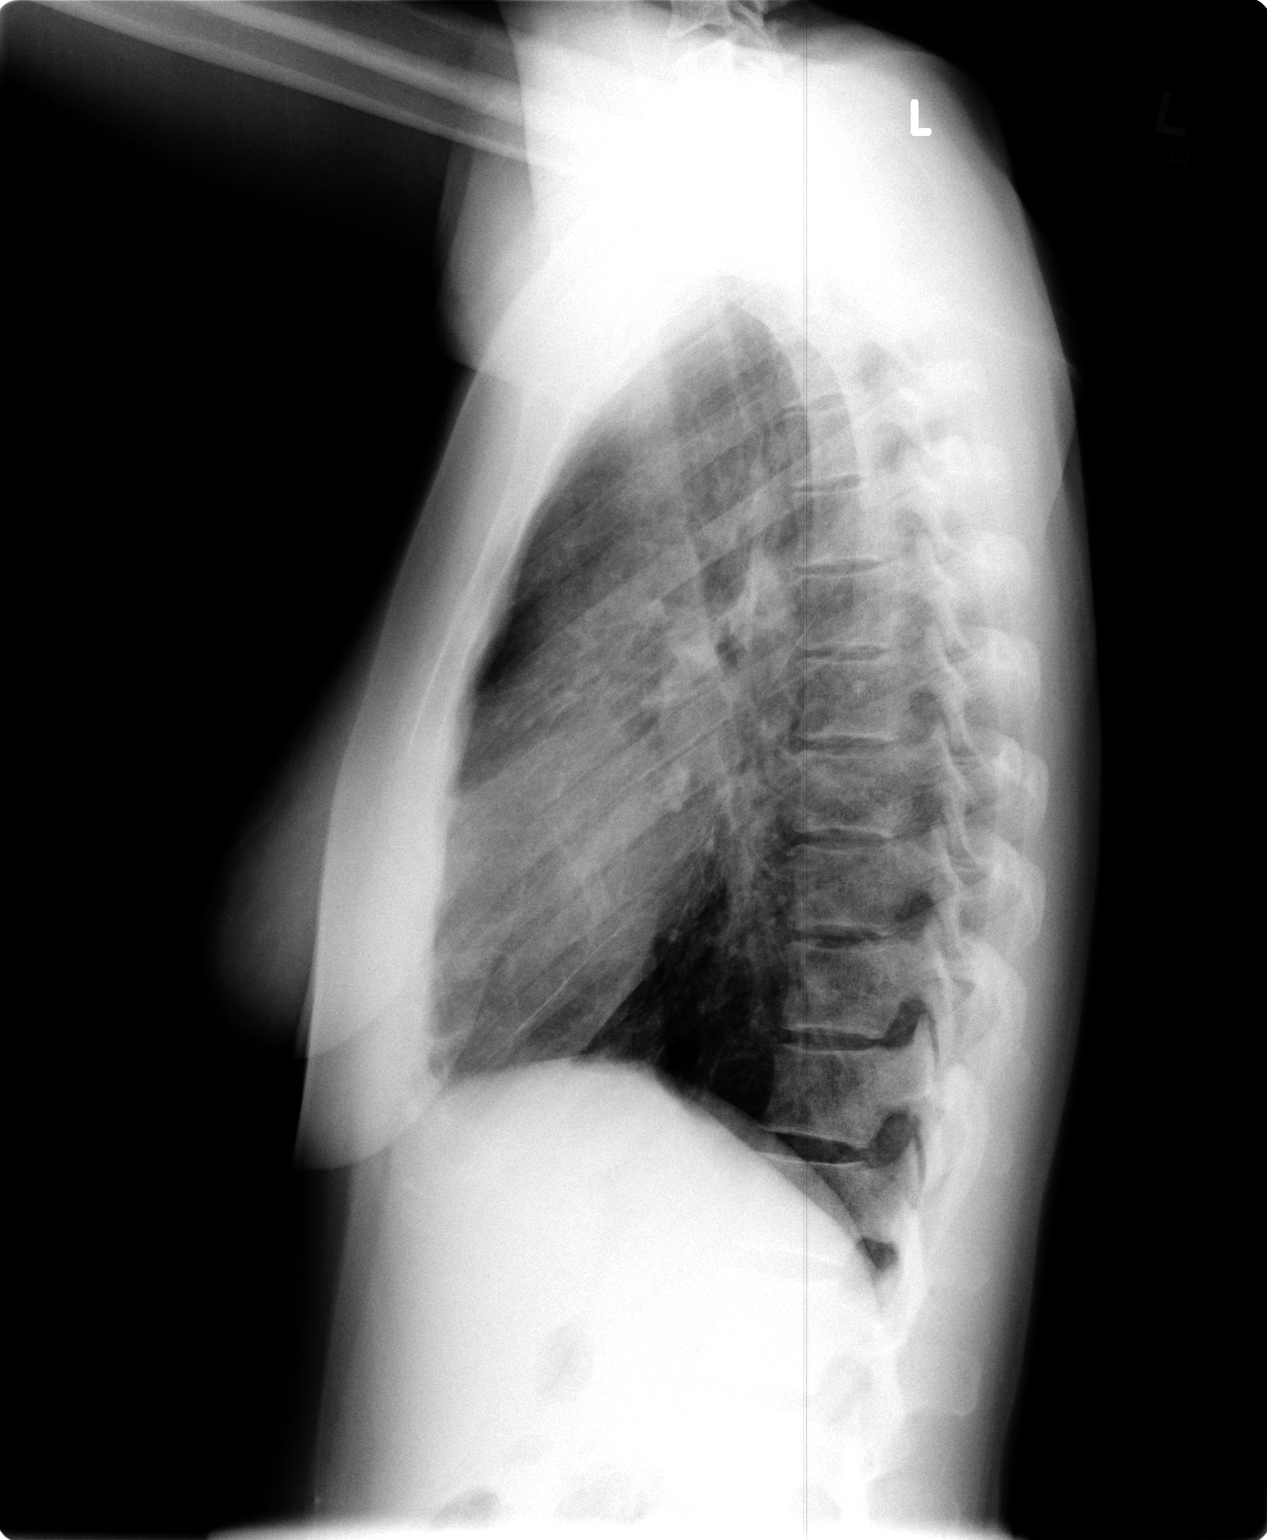

[2 of 2 positions shown; findings below may reference images not displayed]

Central peribronchial thickening is again seen however there has been resolution of perihilar opacities since previous study.  There is no evidence of acute infiltrate or pleural effusion.  Heart size and mediastinal contours are within normal limits.
 IMPRESSION
 Bronchitic changes.  No evidence of airspace disease.

## 2006-03-14 ENCOUNTER — Emergency Department (HOSPITAL_COMMUNITY): Admission: EM | Admit: 2006-03-14 | Discharge: 2006-03-14 | Payer: Self-pay | Admitting: *Deleted

## 2006-04-10 IMAGING — CR DG LUMBAR SPINE COMPLETE 4+V
5 series · 5 of 5 positions shown · non-contrast
Comparison: 08/06/03
 Previously noted fracture right S2 region is healed.  Faint residua is noted.  Bifid spinous process first sacral segment.

CLINICAL DATA: fell down stairs three days ago; lower back pain radiating into coccyx region; sacrococcygeal fracture last year
 LUMBAR SPINE-FIVE VIEWS:
 Minimal lumbar scoliosis convex right.  No acute fracture.  No diastasis or spondylolisthesis.

[view not recorded (1 of 5)]
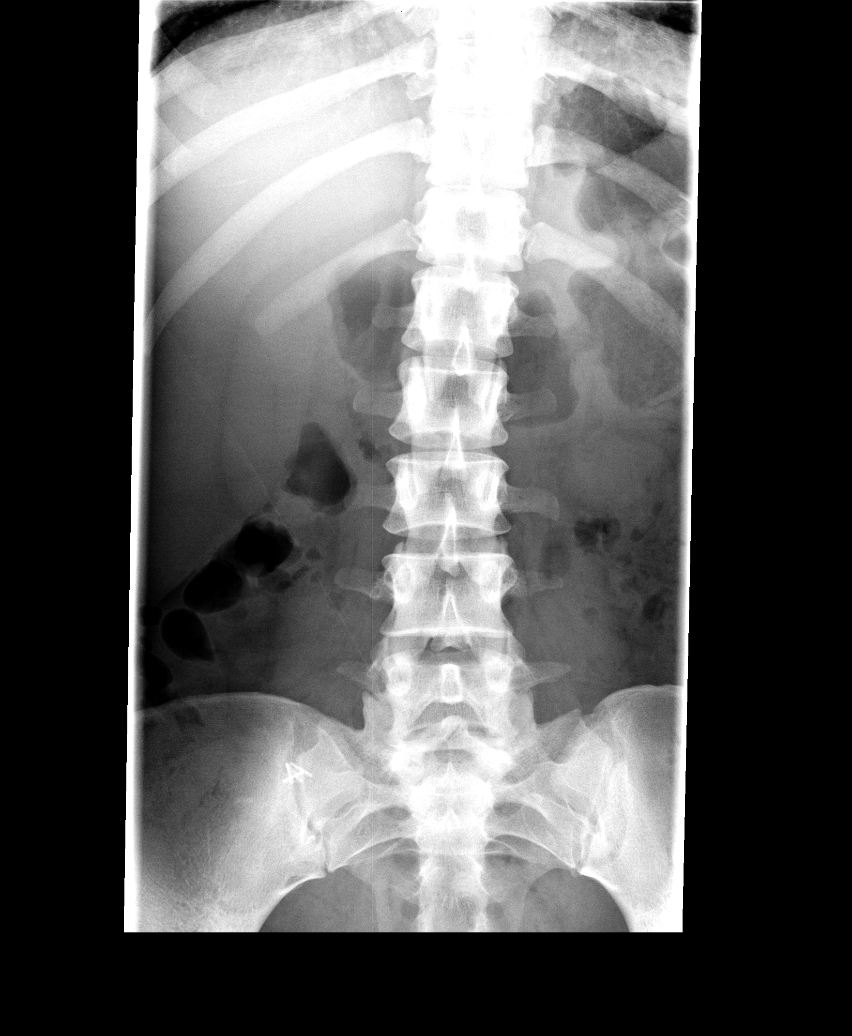

[view not recorded (2 of 5)]
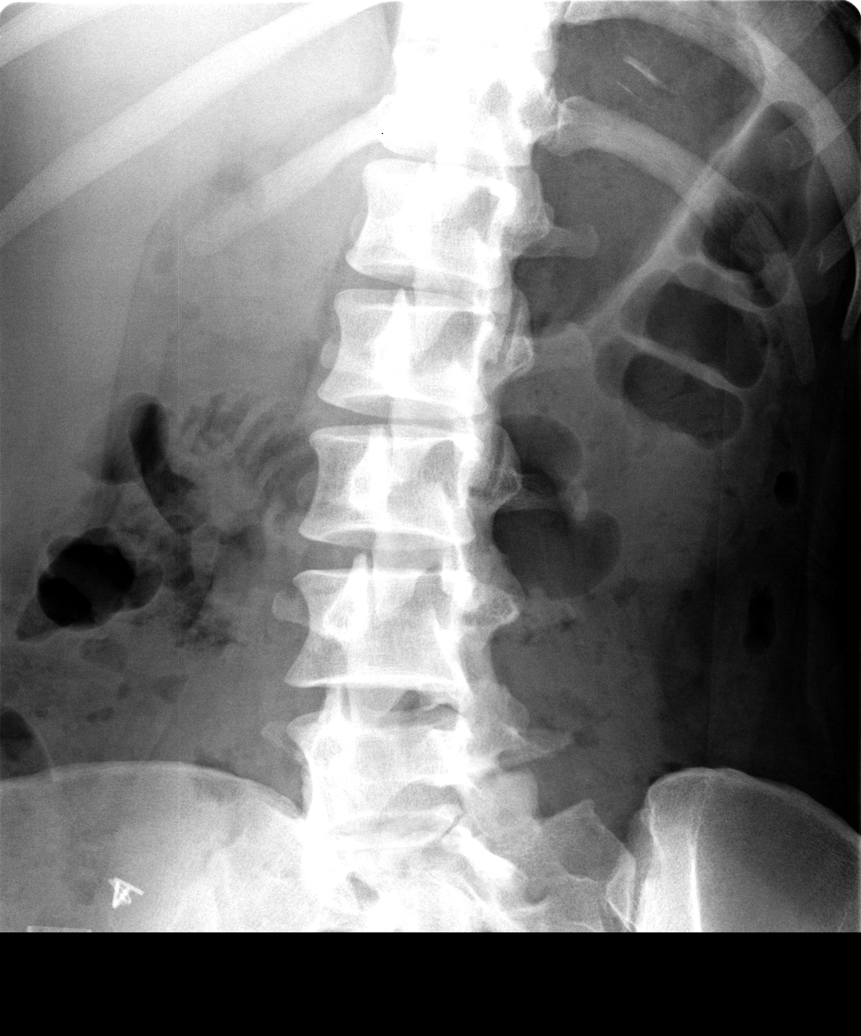

[view not recorded (3 of 5)]
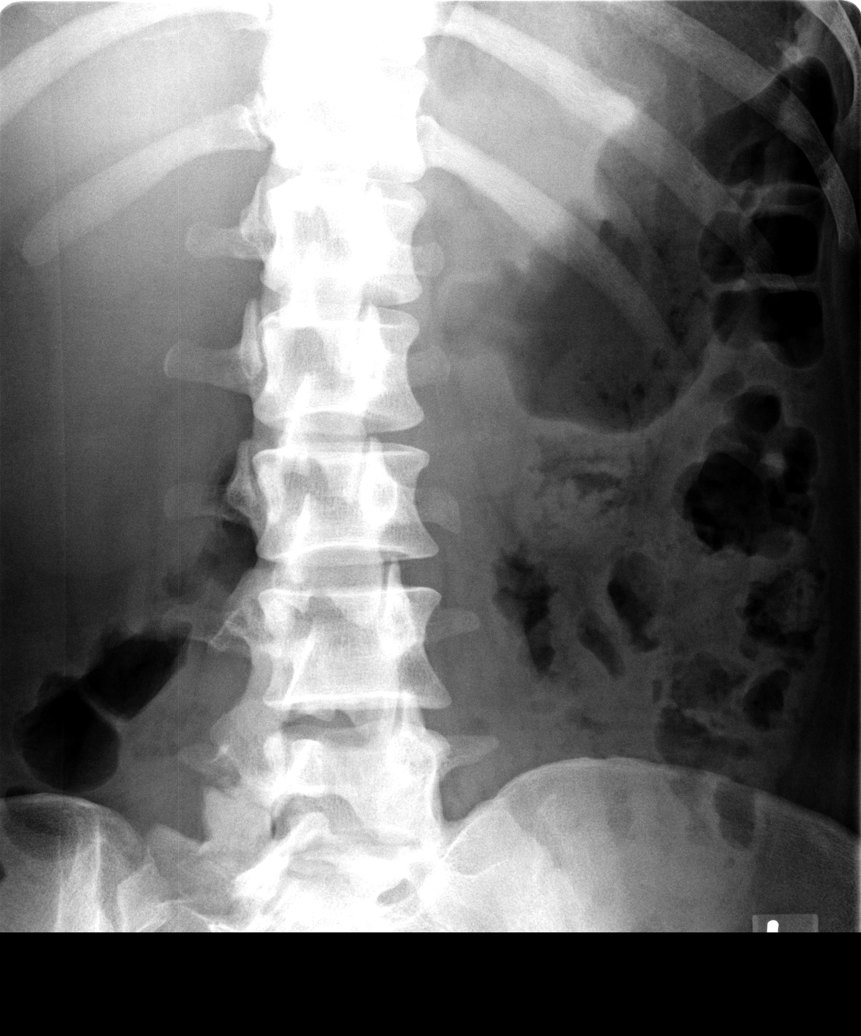

[view not recorded (4 of 5)]
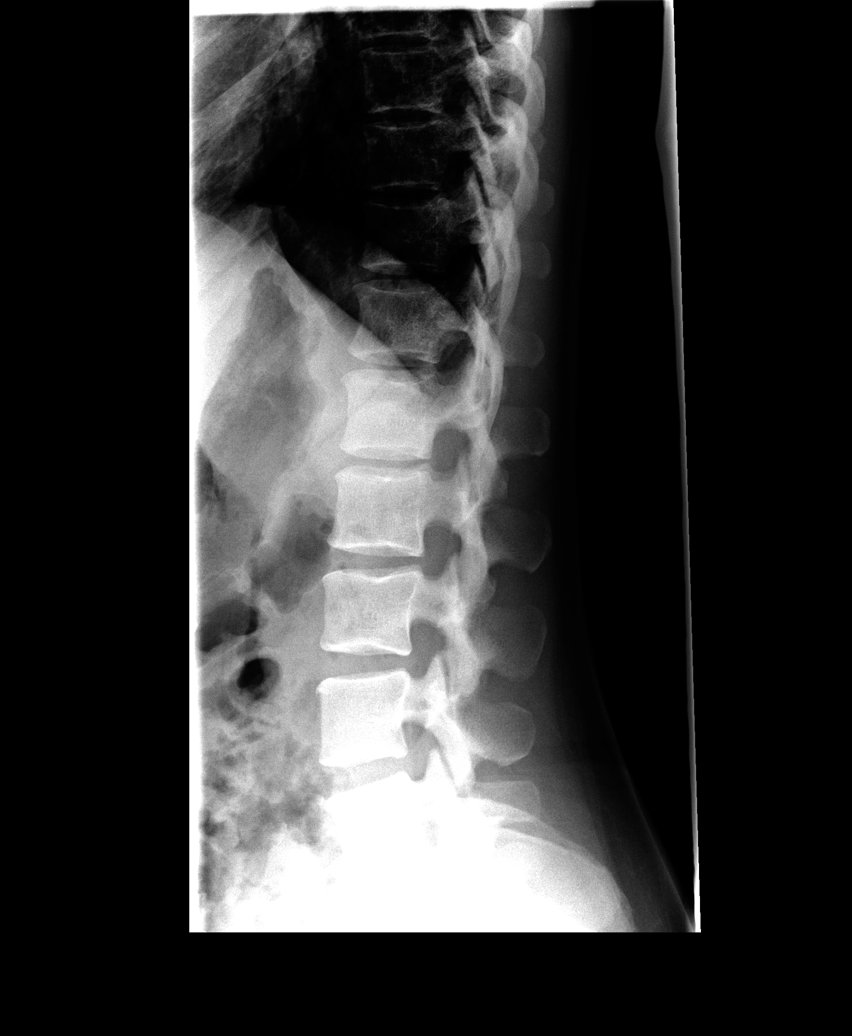

[view not recorded (5 of 5)]
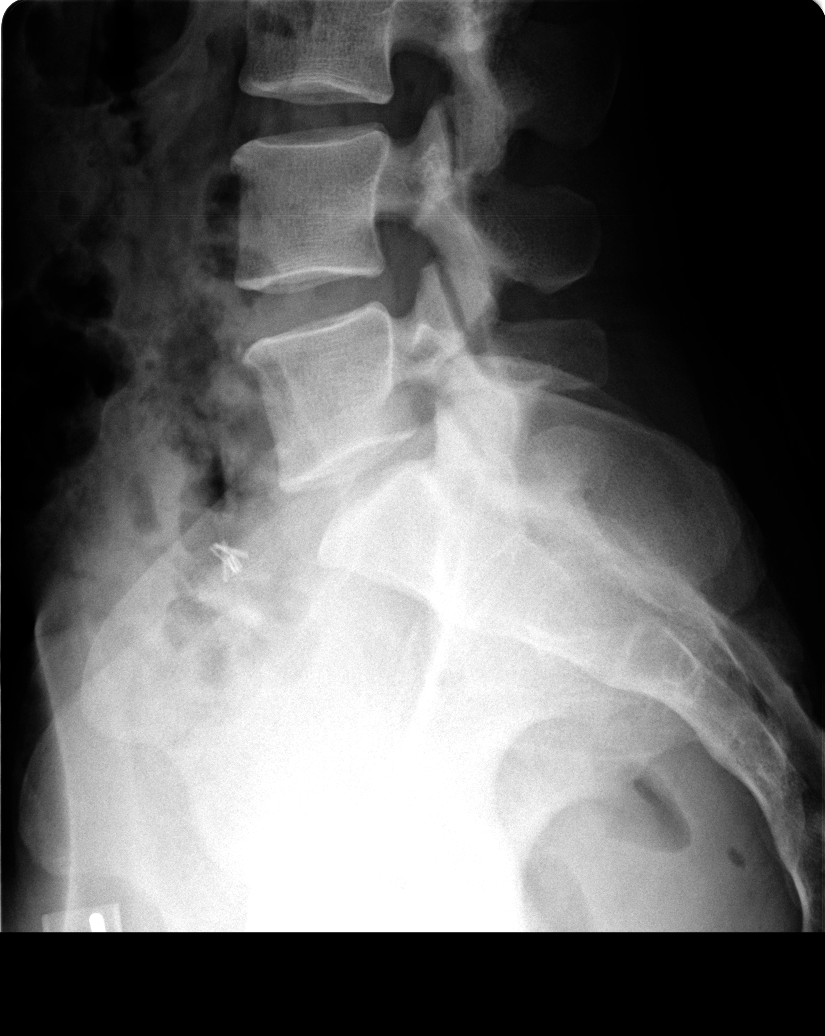

[5 of 5 positions shown; findings below may reference images not displayed]

IMPRESSION: No acute lumbar spine abnormality.
 SACRUM COCCYX- THREE VIEWS:
IMPRESSION: Negative for acute sacrococcygeal fracture.

## 2006-04-10 IMAGING — CR DG SACRUM/COCCYX 2+V
3 series · 3 of 3 positions shown · non-contrast
Comparison: 08/06/03
 Previously noted fracture right S2 region is healed.  Faint residua is noted.  Bifid spinous process first sacral segment.

CLINICAL DATA: fell down stairs three days ago; lower back pain radiating into coccyx region; sacrococcygeal fracture last year
 LUMBAR SPINE-FIVE VIEWS:
 Minimal lumbar scoliosis convex right.  No acute fracture.  No diastasis or spondylolisthesis.

[view not recorded (1 of 3)]
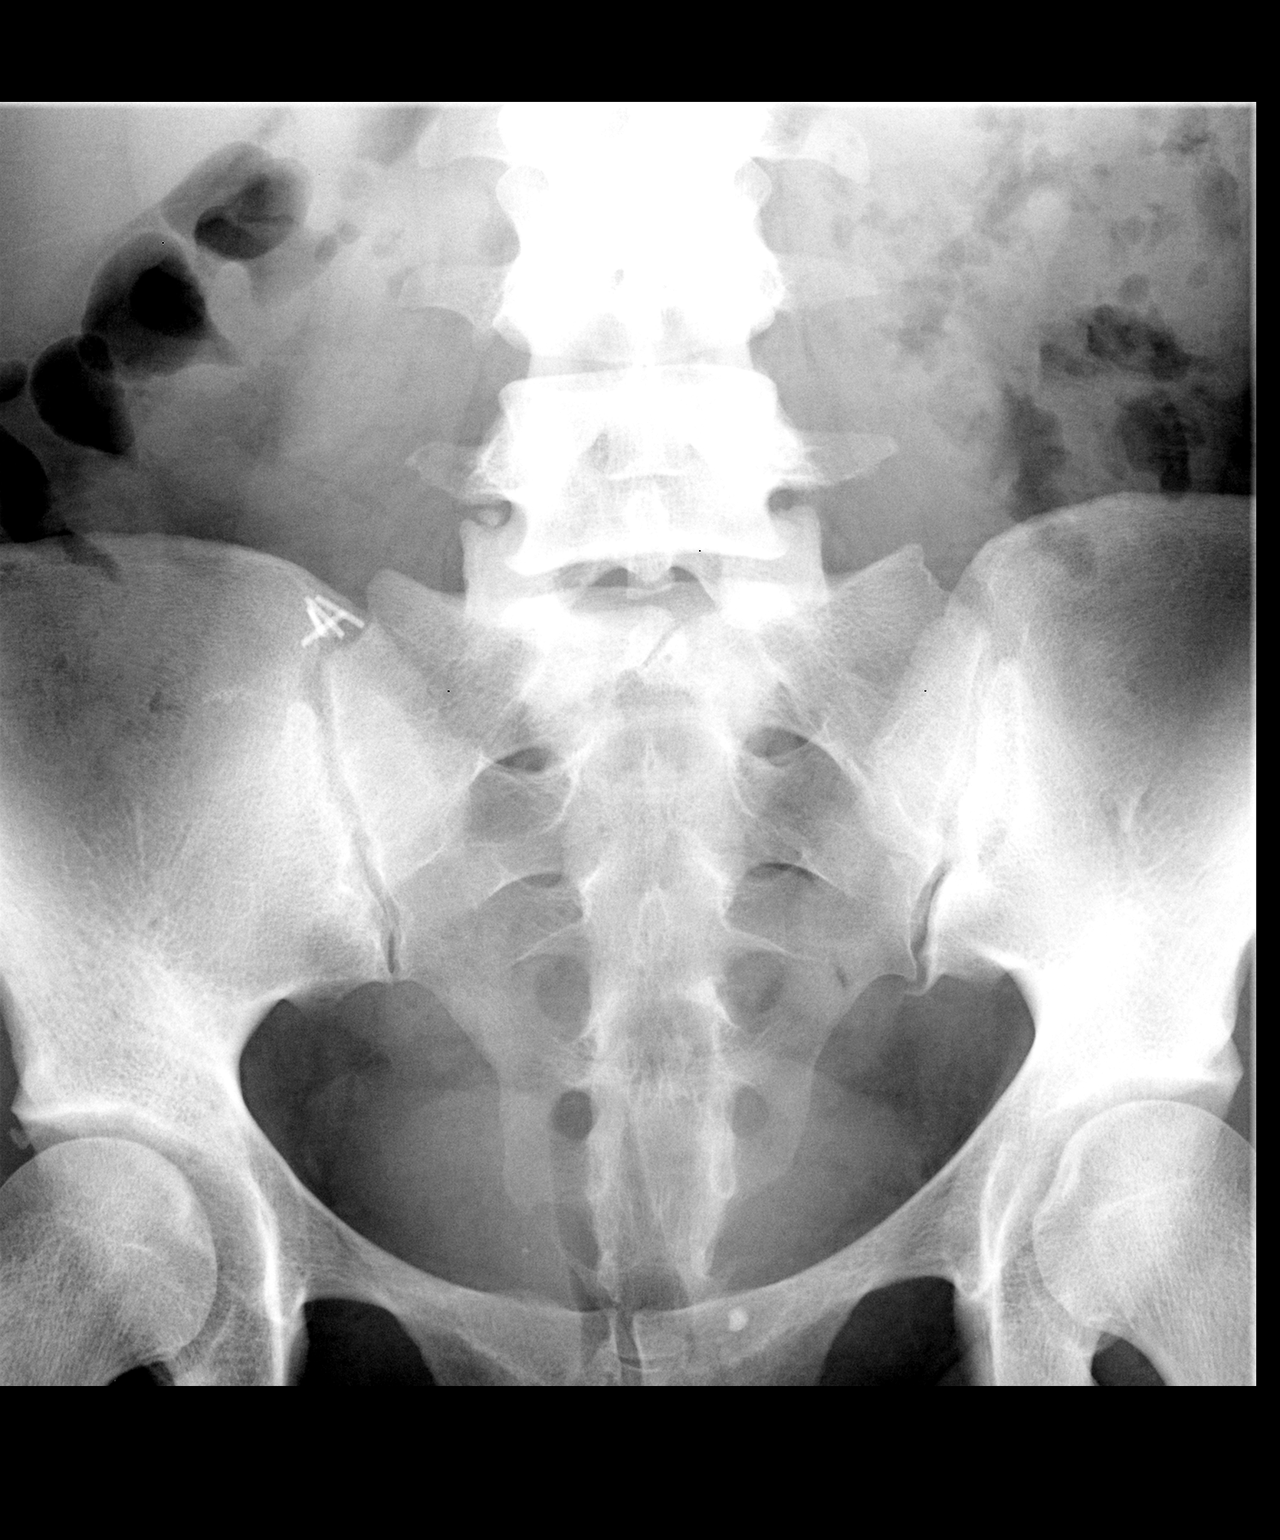

[view not recorded (2 of 3)]
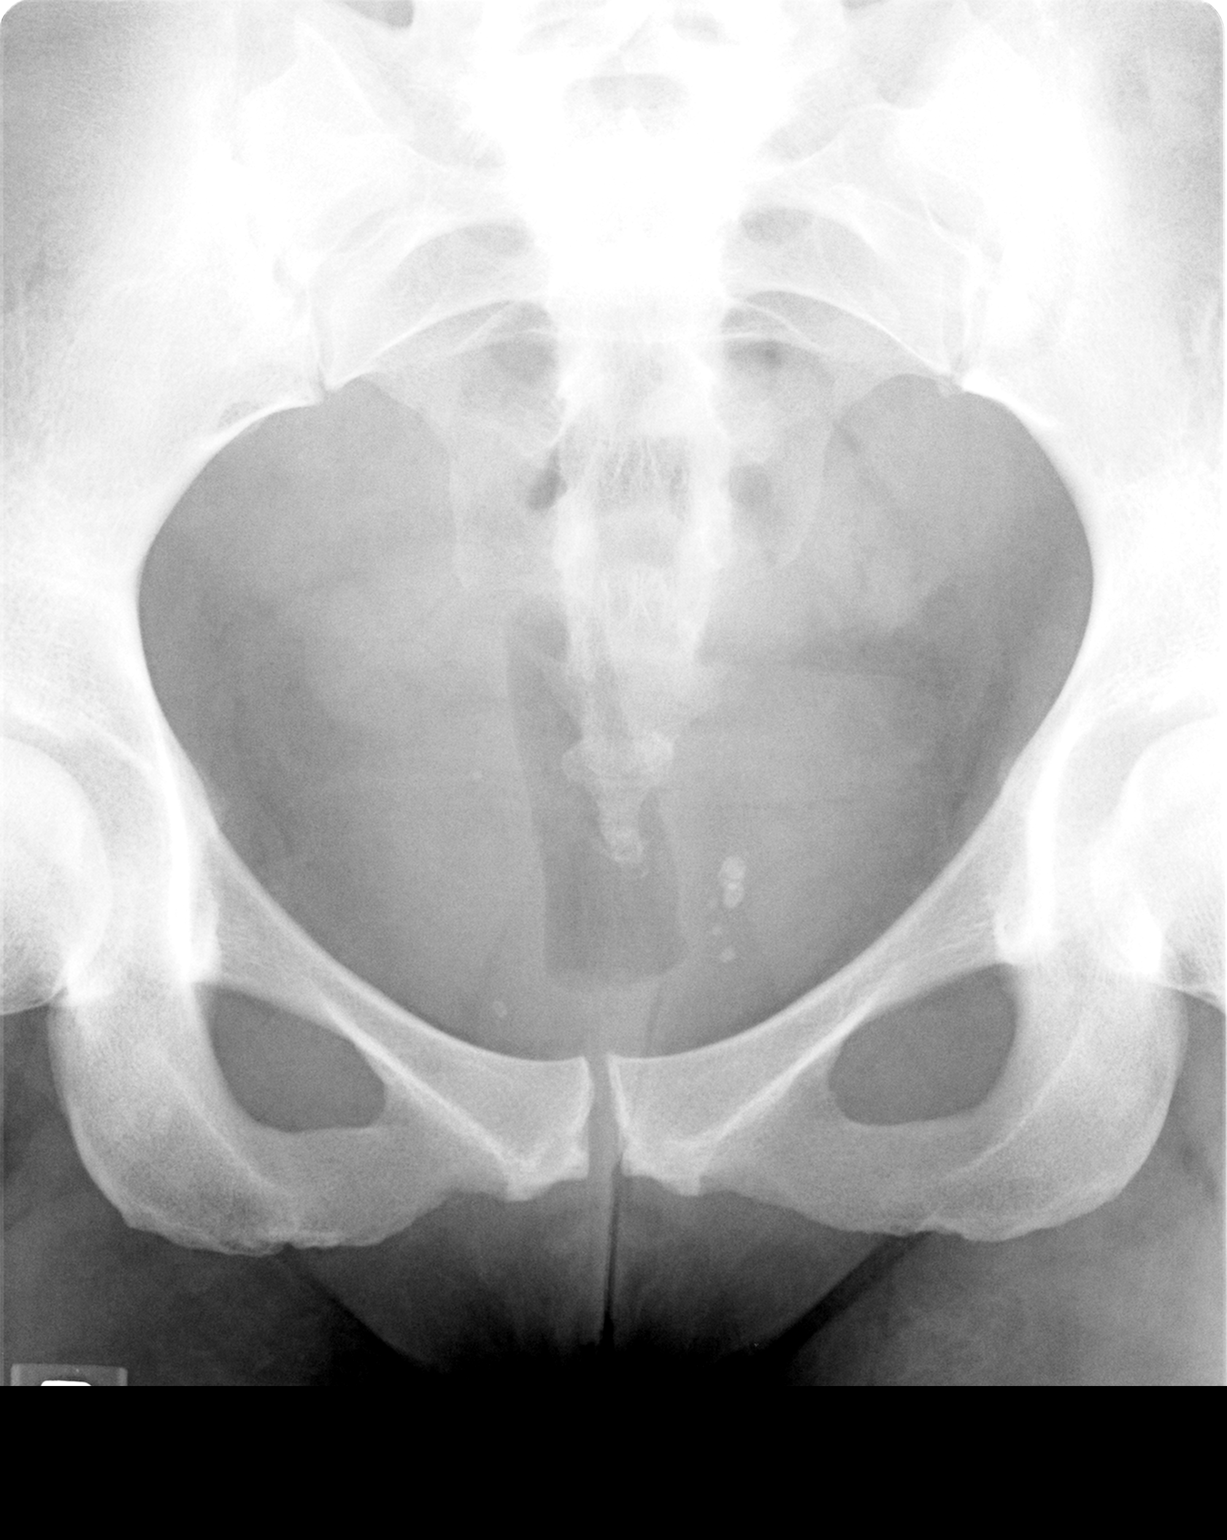

[view not recorded (3 of 3)]
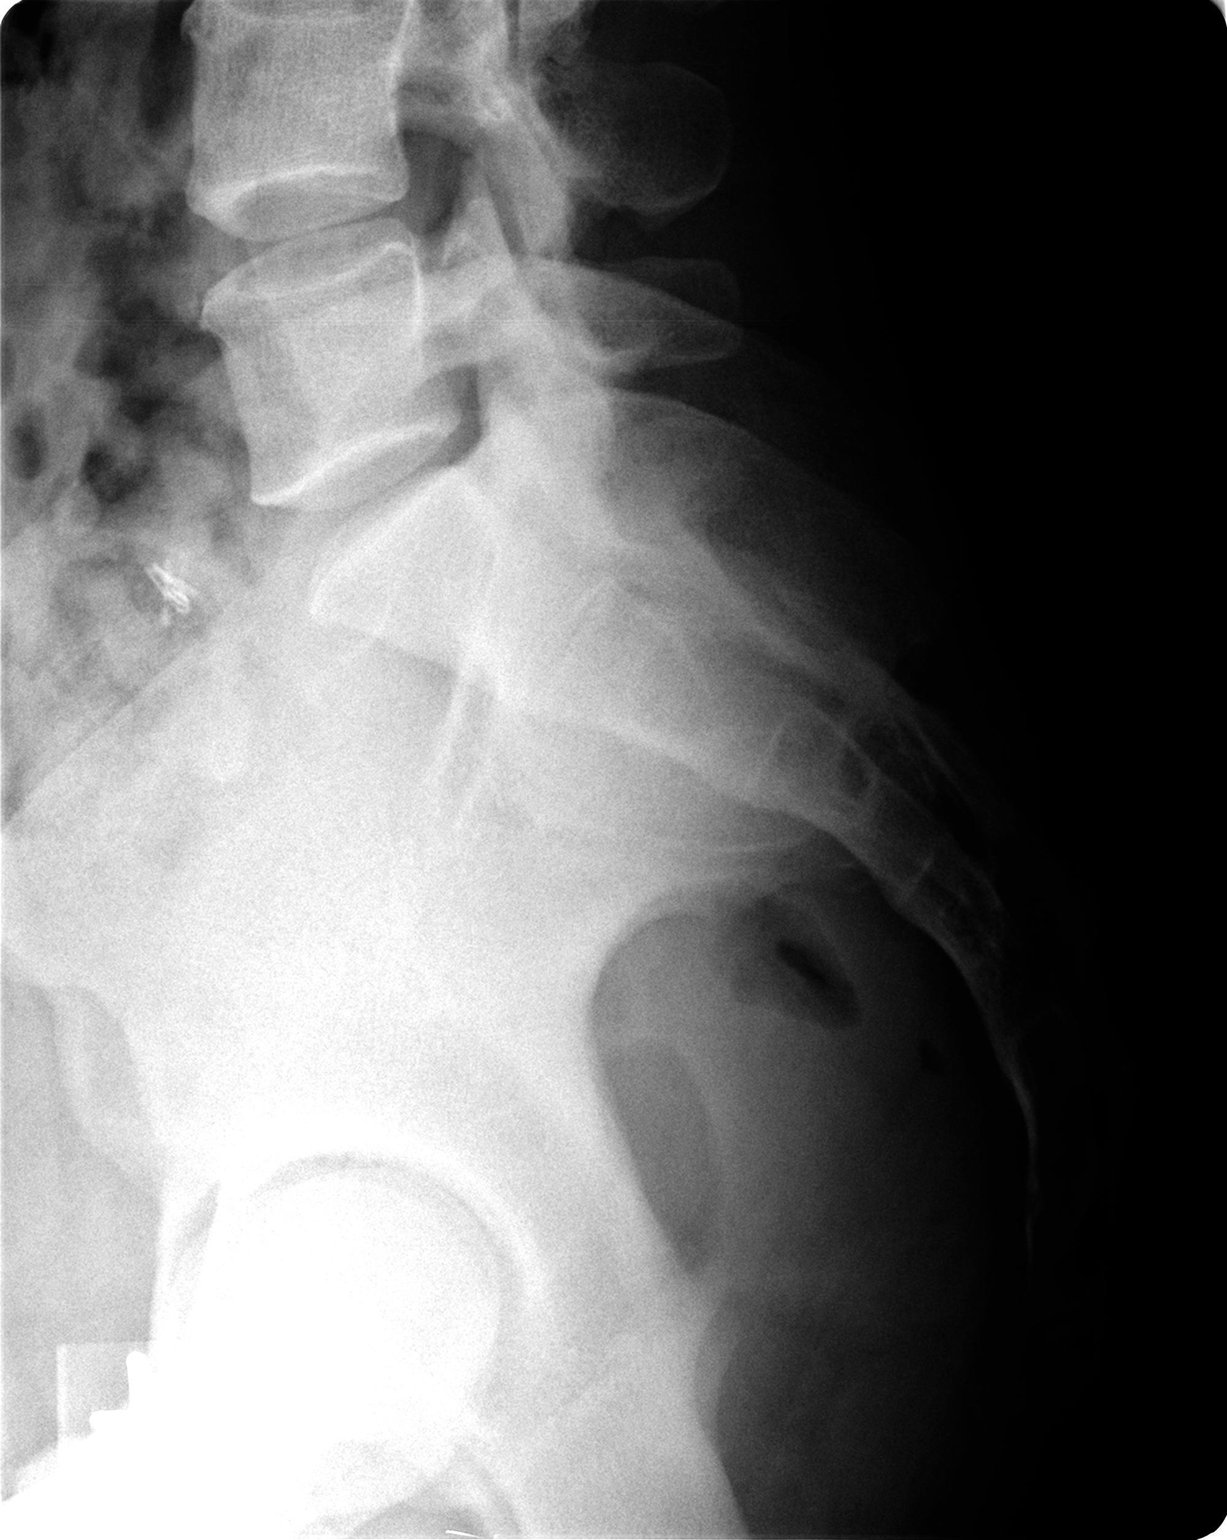

[3 of 3 positions shown; findings below may reference images not displayed]

IMPRESSION: No acute lumbar spine abnormality.
 SACRUM COCCYX- THREE VIEWS:
IMPRESSION: Negative for acute sacrococcygeal fracture.

## 2006-05-07 IMAGING — CR DG CHEST 2V
2 series · 2 of 2 positions shown · non-contrast
Comparison: 10/01/2003.

CLINICAL DATA: Shortness of breath.
 2-VIEWS OF THE CHEST ? 04/02/2004 ? ([DATE] HOURS):

[w chest pa]
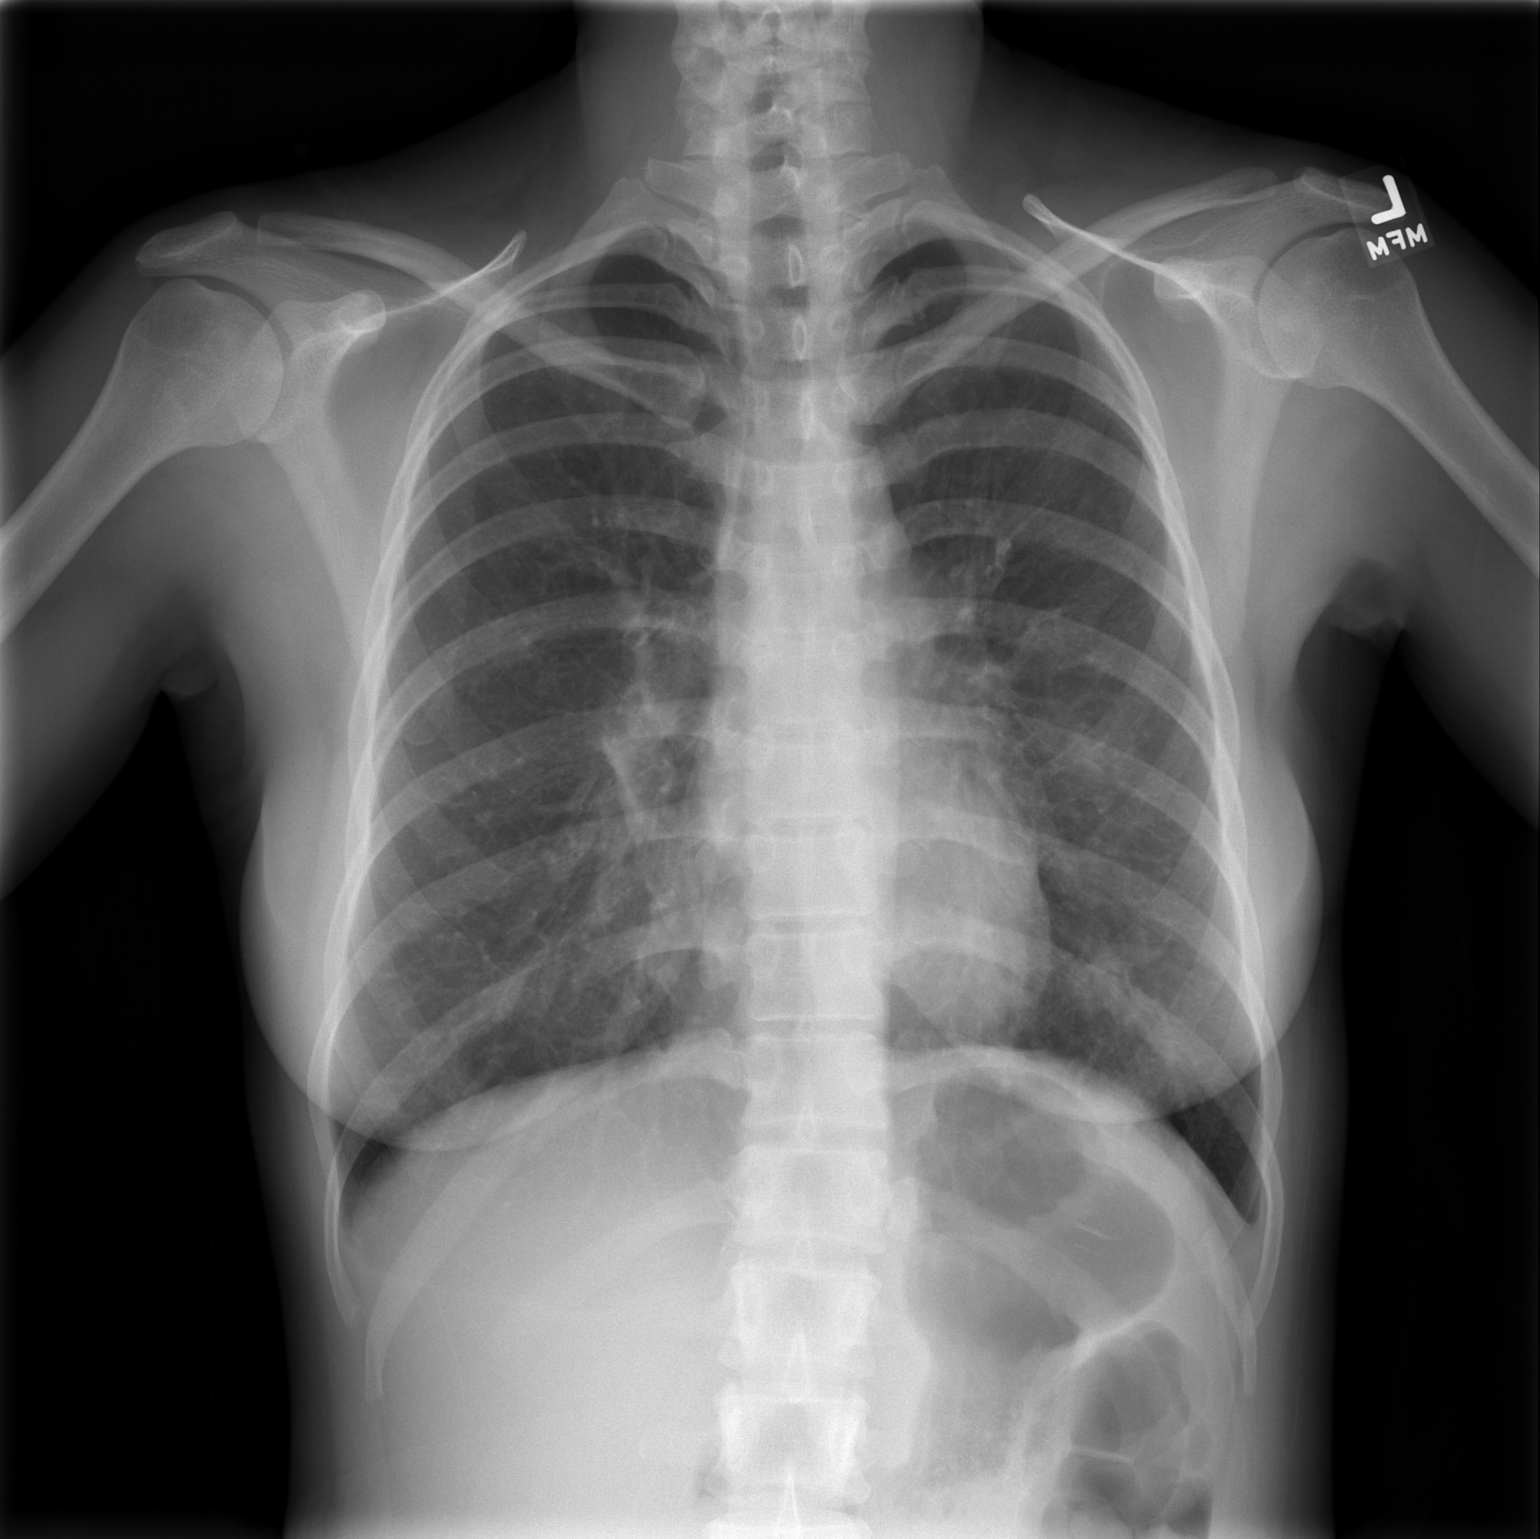

[w chest lat]
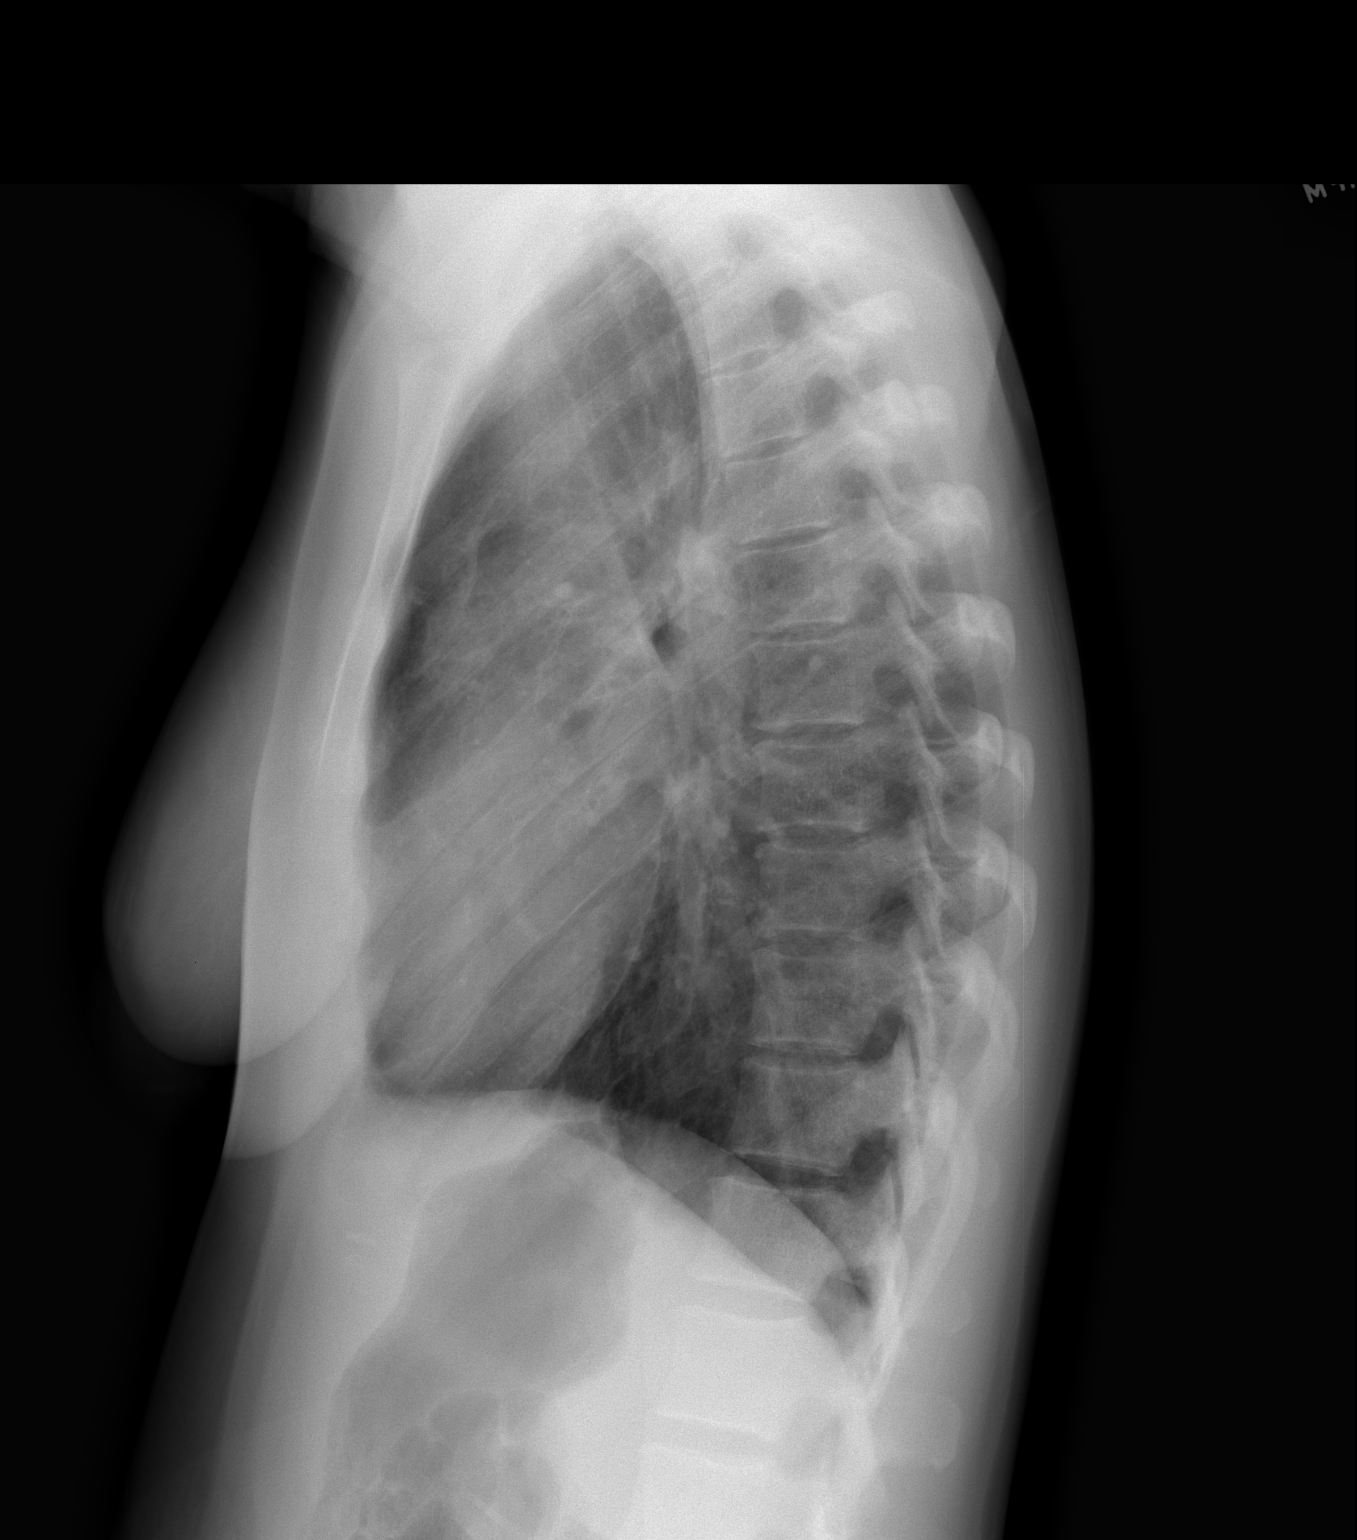

[2 of 2 positions shown; findings below may reference images not displayed]

FINDINGS: Bronchitic changes are noted.  No focal consolidations or pulmonary mass is seen.  No pneumothoraces or effusions are seen.  The heart is normal in size.
IMPRESSION: Stable bronchitic changes.

## 2006-10-14 IMAGING — CR DG SHOULDER 2+V*L*
3 series · 3 of 3 positions shown · non-contrast
Comparison: none

CLINICAL DATA: Left shoulder pain.  
 LEFT SHOULDER -  THREE VIEW:
 There is no evidence of fracture or dislocation.  There is no evidence of arthropathy or other focal bone abnormality.  Soft tissues are unremarkable.

[w shoulder ap internal left *]
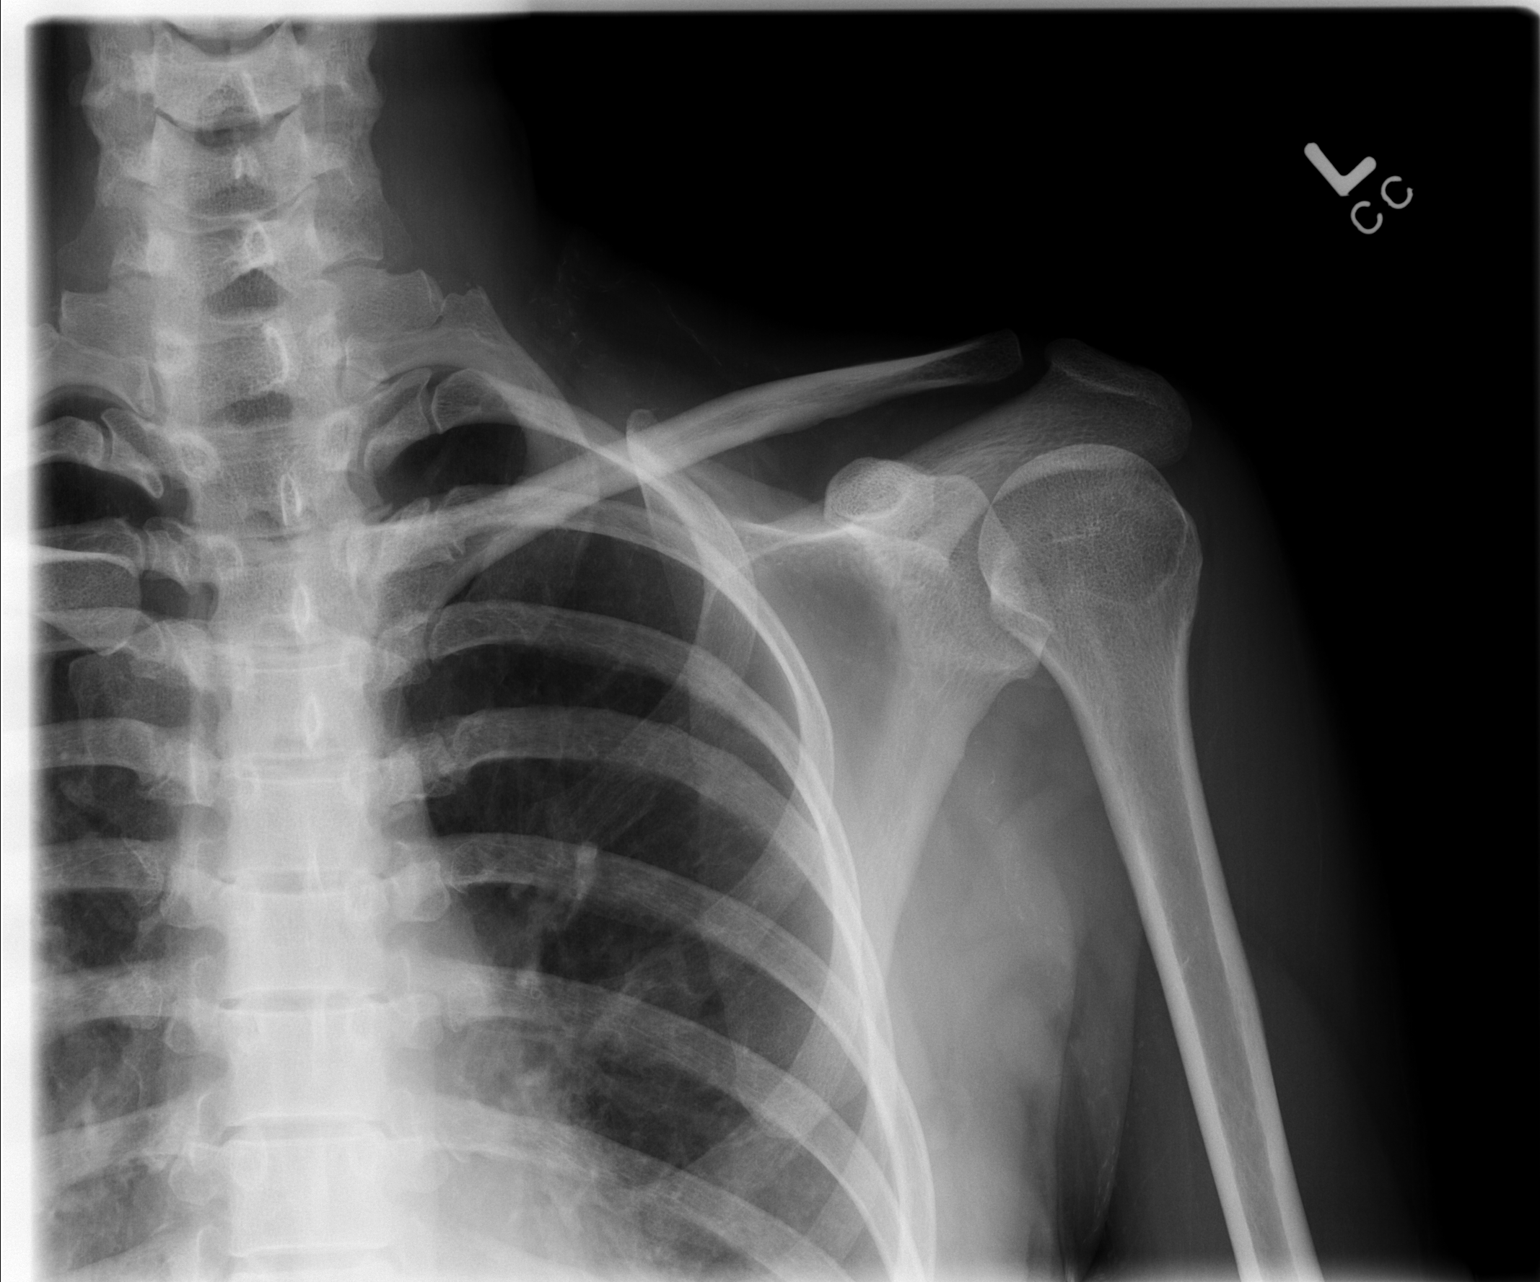

[w shoulder ap external left]
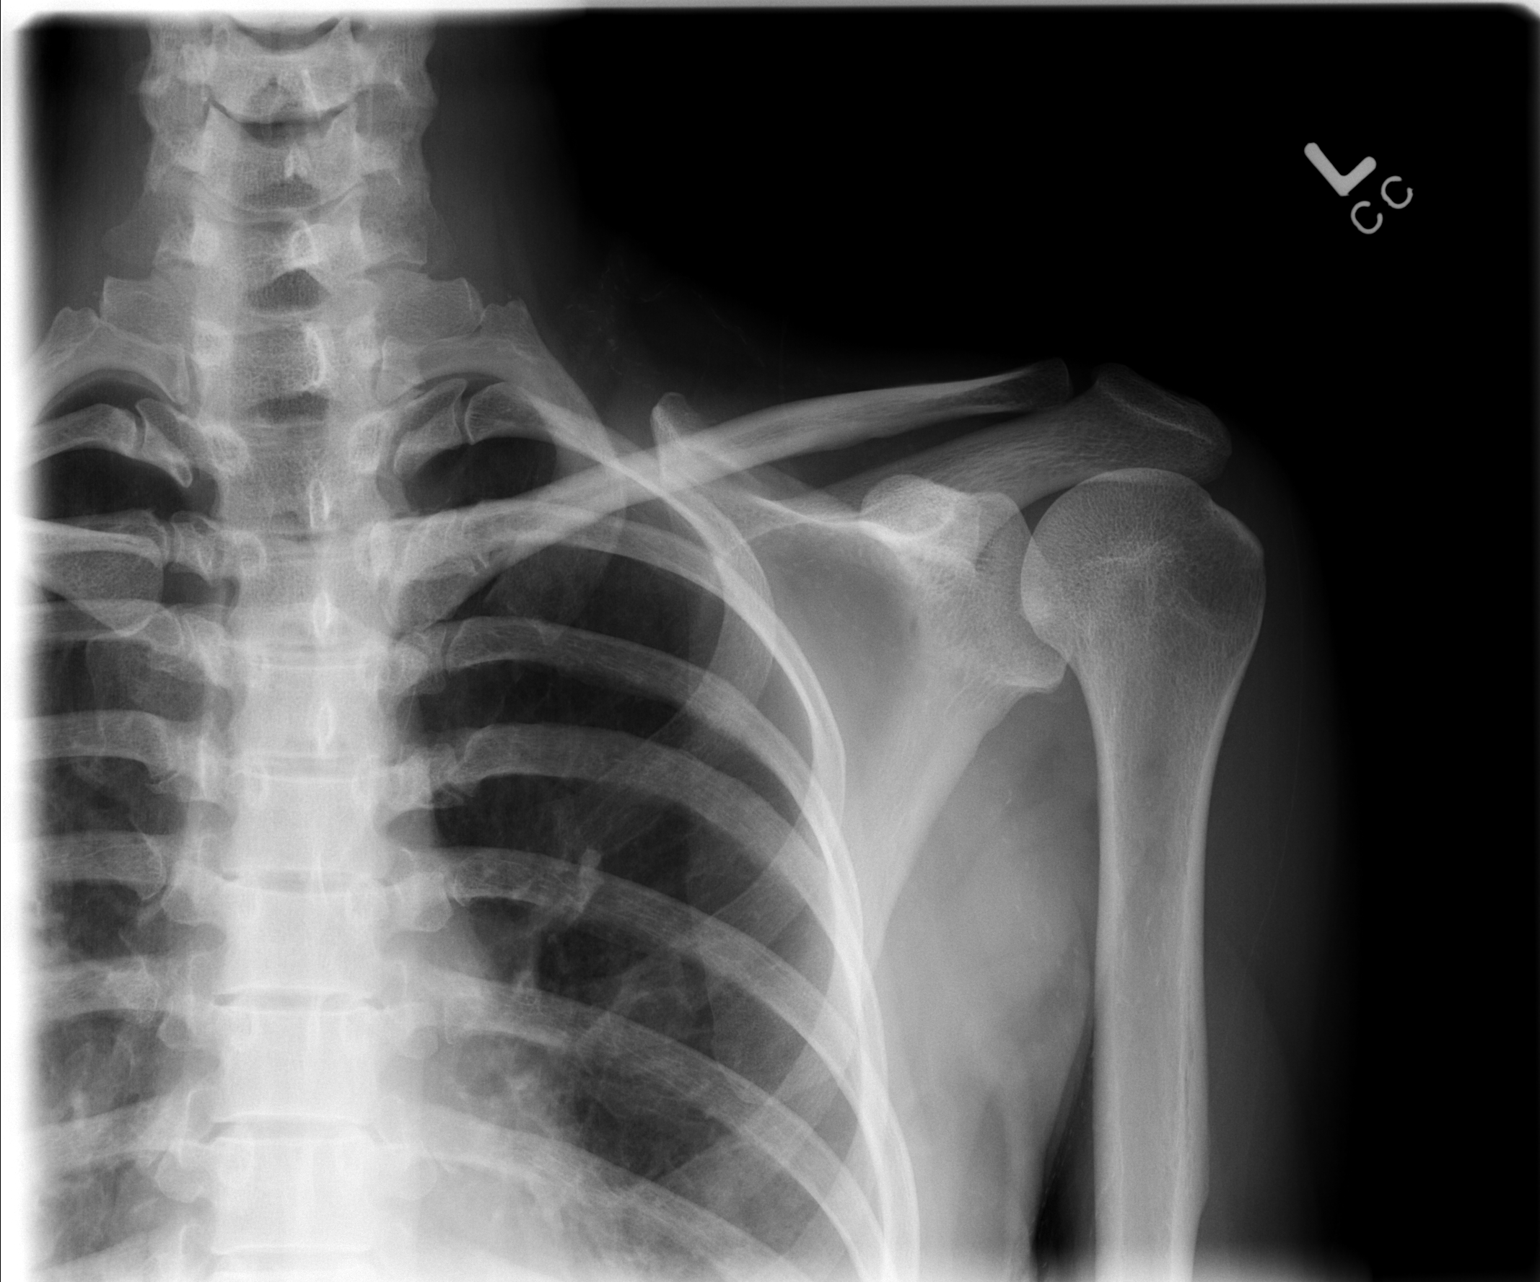

[w shoulder y view left]
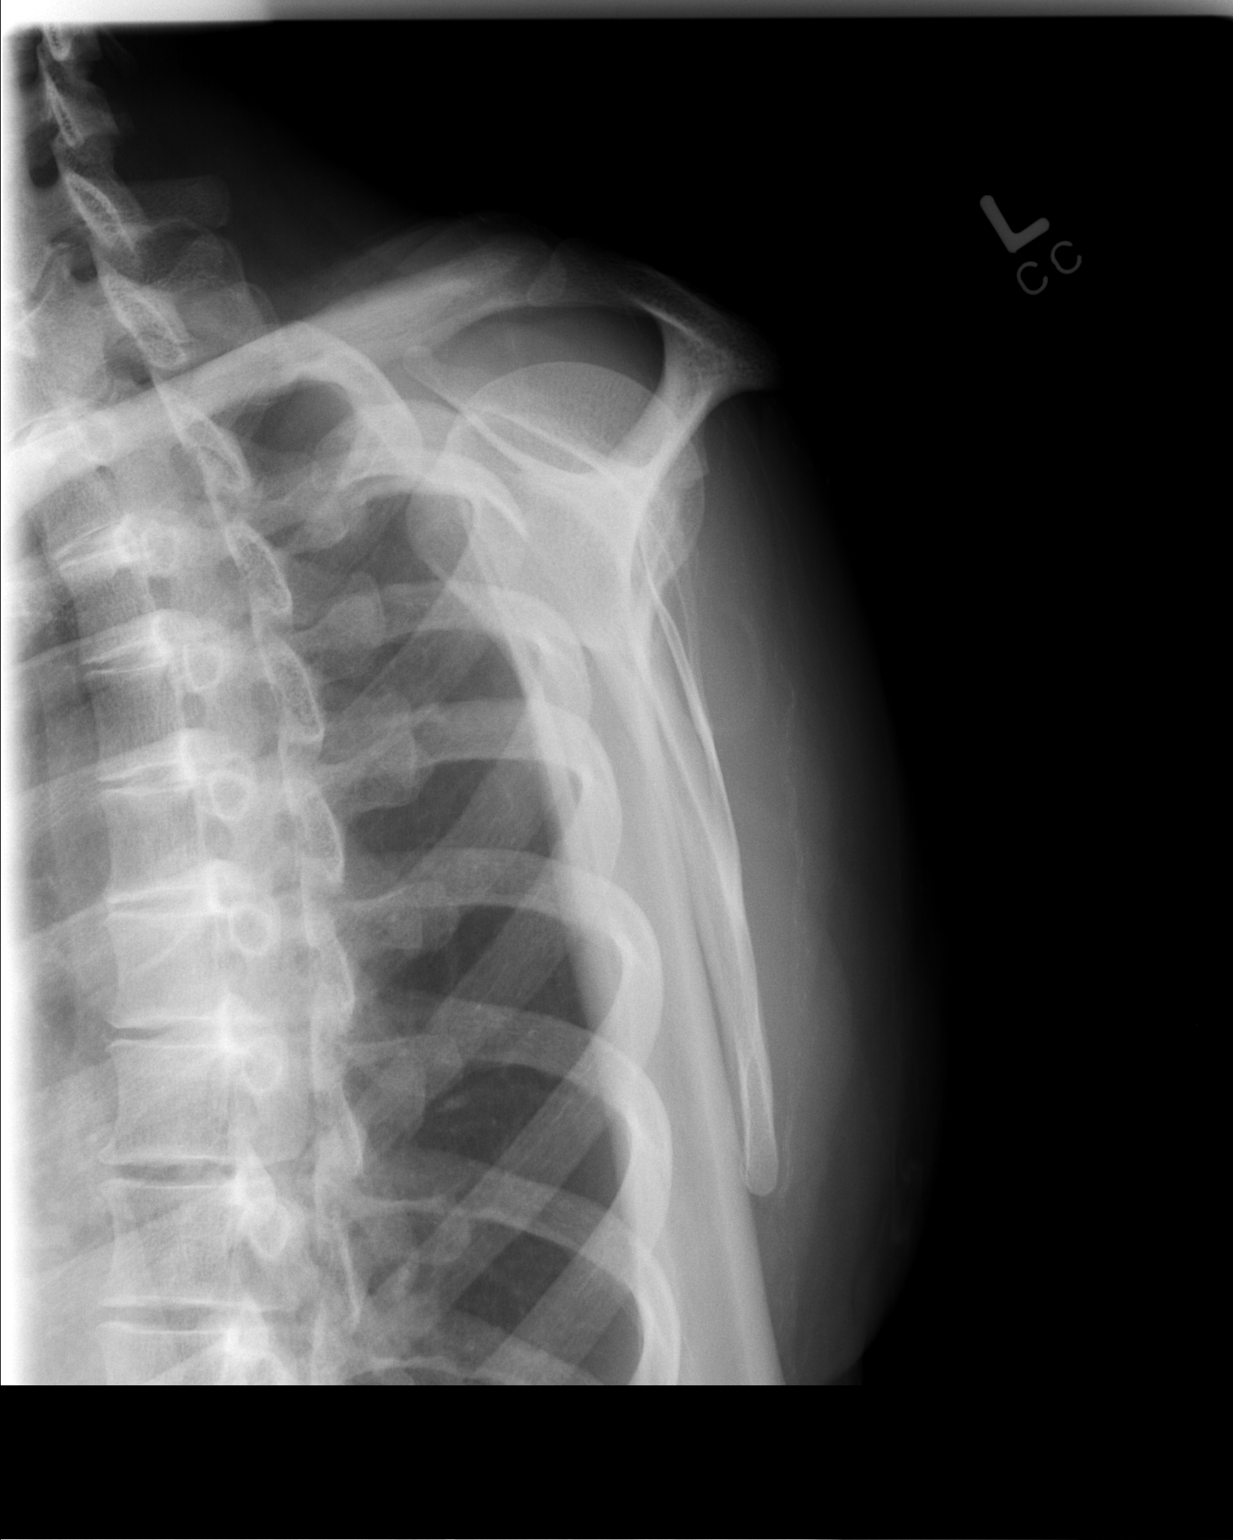

[3 of 3 positions shown; findings below may reference images not displayed]

IMPRESSION: Negative

## 2006-11-25 IMAGING — CR DG CHEST 2V
2 series · 2 of 2 positions shown · non-contrast
Comparison: 04/02/04.

CLINICAL DATA: Wheezing and coughing.  History of smoking. 
2-VIEW CHEST:

[view not recorded (1 of 2)]
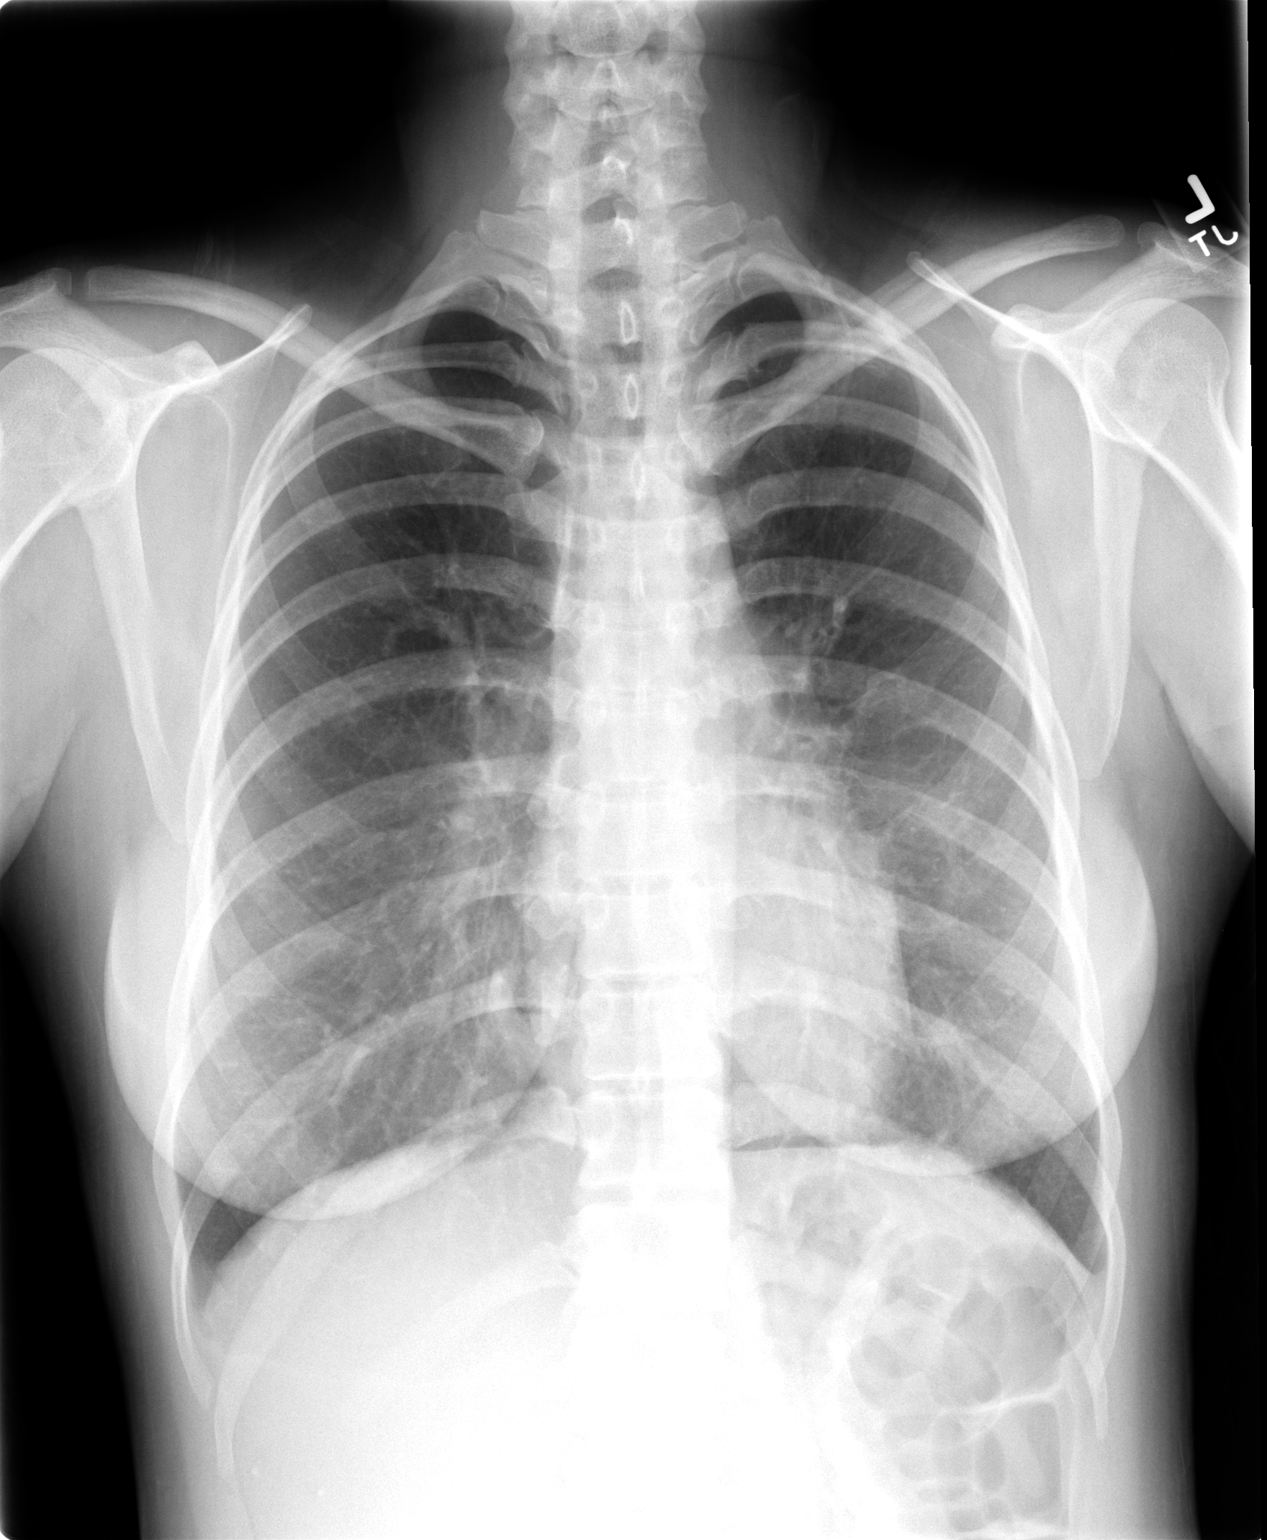

[view not recorded (2 of 2)]
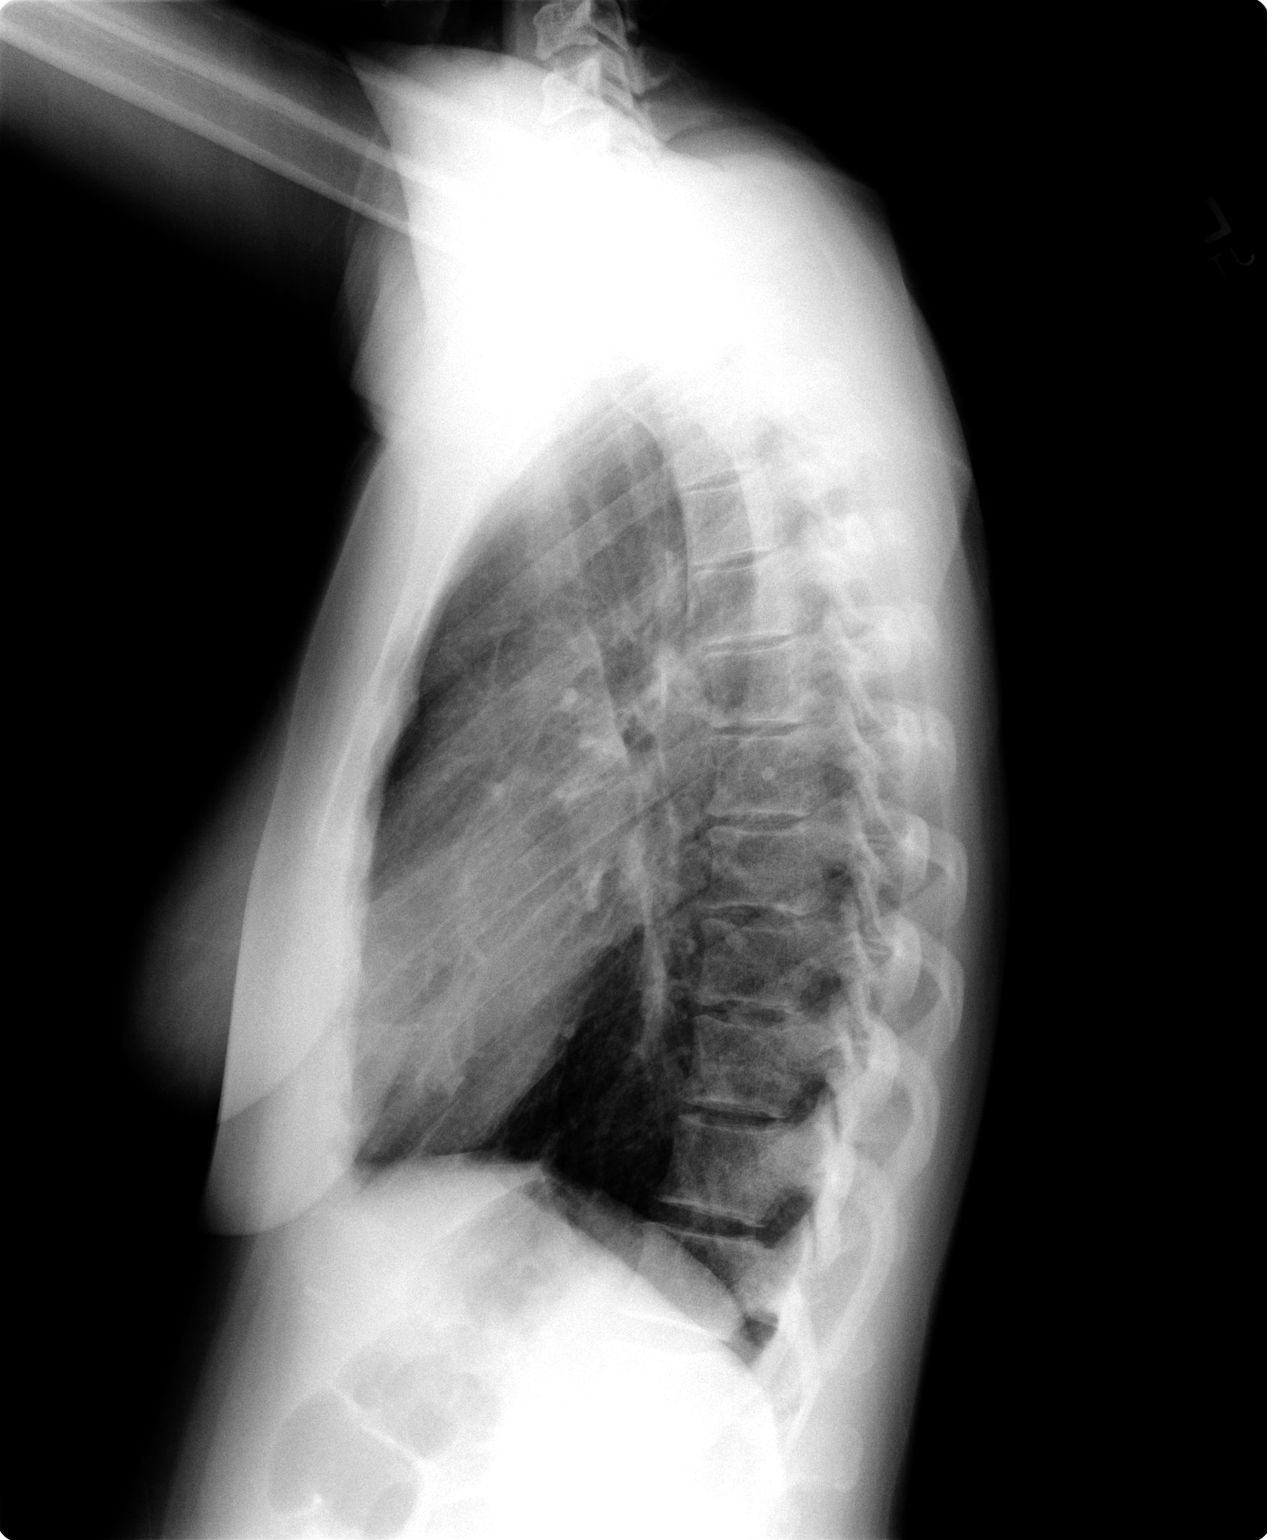

[2 of 2 positions shown; findings below may reference images not displayed]

FINDINGS: Trachea is midline.  Heart size is within normal limits.  There is central peribronchial prominence.  A nodular density in the left lower lung zone is seen on 10/01/03 exam and likely represents a confluence of shadows or scarring in this patient with a history of airspace disease in the lingula on chest CT of 06/02/03.  Lungs are otherwise clear.  No pleural fluid.
IMPRESSION: 1.  No acute cardiopulmonary process.
2.  Nodular density in the lower left lung zone may represent scarring in this patient with history of previous pneumonia as seen on chest CT of 06/02/03.

## 2006-11-29 ENCOUNTER — Emergency Department (HOSPITAL_COMMUNITY): Admission: EM | Admit: 2006-11-29 | Discharge: 2006-11-29 | Payer: Self-pay | Admitting: Emergency Medicine

## 2006-12-18 IMAGING — CR DG CHEST 1V PORT
1 series · 1 of 1 positions shown · non-contrast
Comparison: 10/21/04. Hyperaeration of the lungs. Chronic peribronchitic like markings.  No CHF.

CLINICAL DATA: Chest pain, shortness of breath, smoker, history of asthma. 
 PORTABLE CHEST - VNF4O  (9626 HOURS):

[view not recorded]
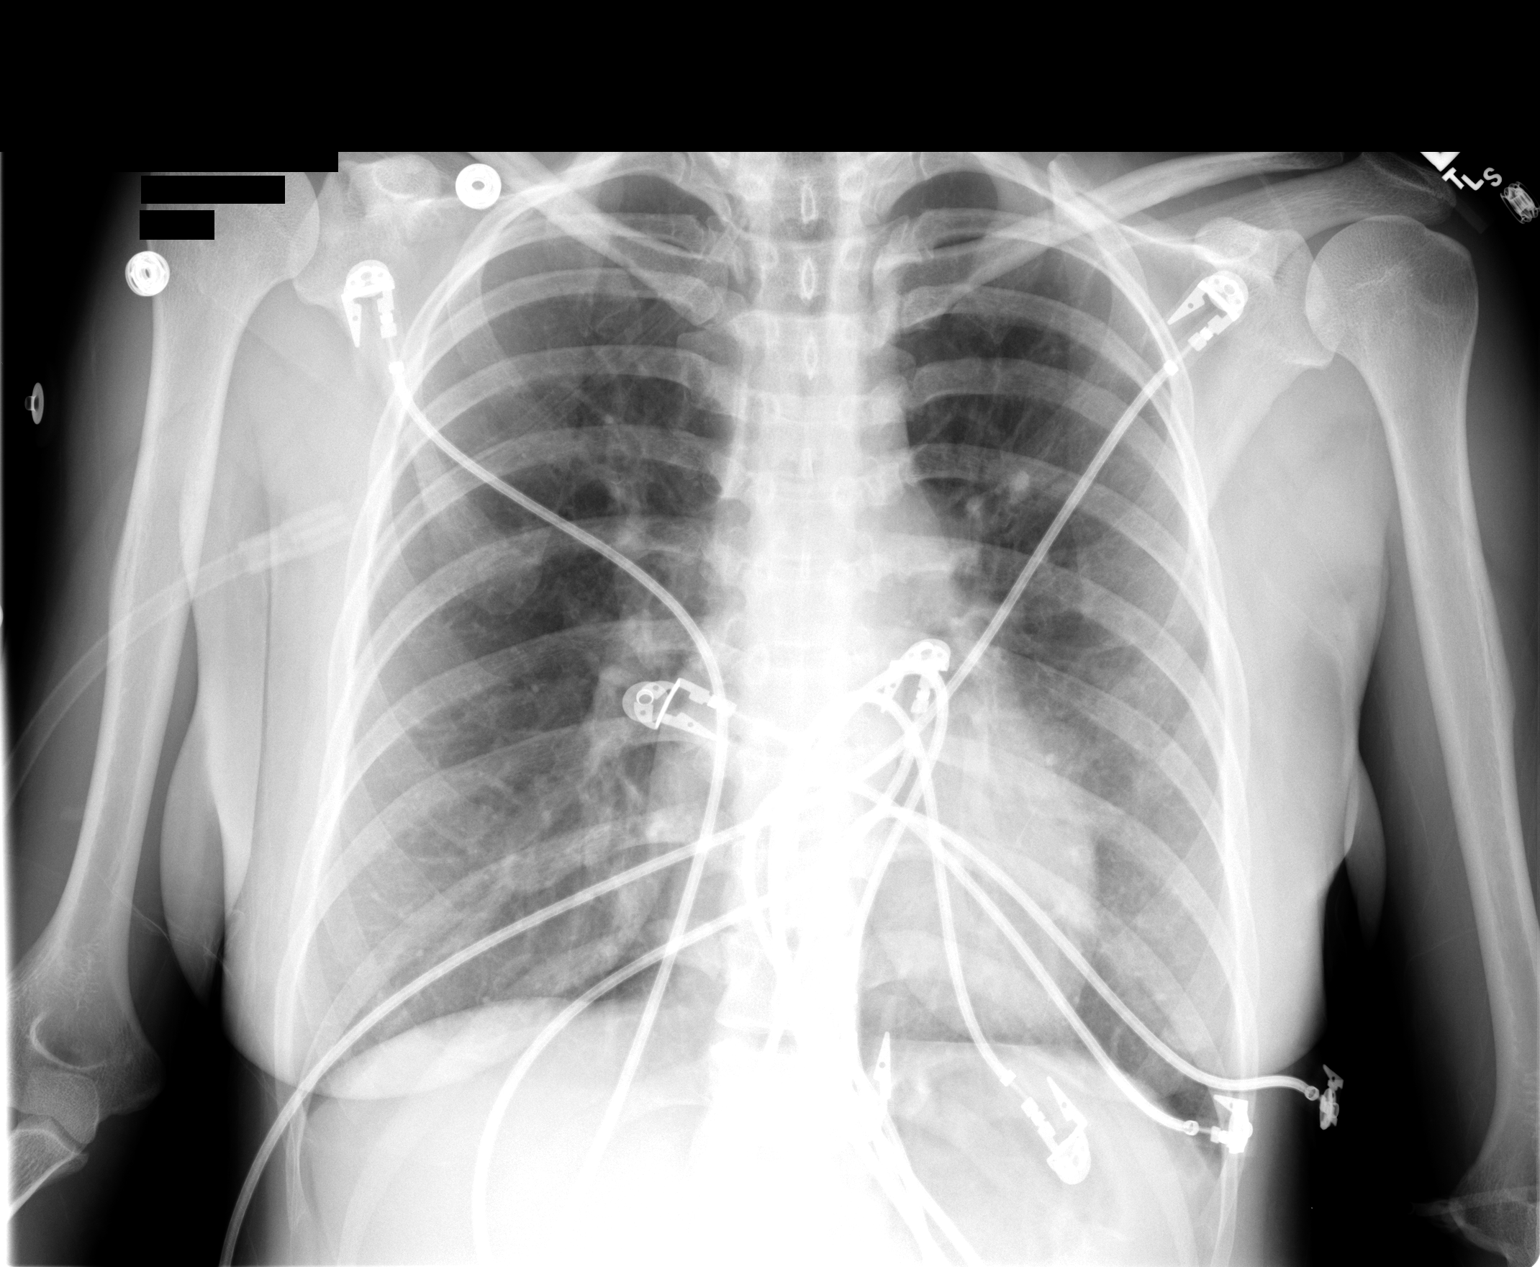

[1 of 1 positions shown; findings below may reference images not displayed]

IMPRESSION: COPD.  Negative for CHF.

## 2007-06-02 ENCOUNTER — Emergency Department (HOSPITAL_COMMUNITY): Admission: EM | Admit: 2007-06-02 | Discharge: 2007-06-02 | Payer: Self-pay | Admitting: Emergency Medicine

## 2007-10-13 ENCOUNTER — Emergency Department (HOSPITAL_COMMUNITY): Admission: EM | Admit: 2007-10-13 | Discharge: 2007-10-13 | Payer: Self-pay | Admitting: Emergency Medicine

## 2007-10-16 ENCOUNTER — Emergency Department (HOSPITAL_BASED_OUTPATIENT_CLINIC_OR_DEPARTMENT_OTHER): Admission: EM | Admit: 2007-10-16 | Discharge: 2007-10-16 | Payer: Self-pay | Admitting: Emergency Medicine

## 2008-02-14 ENCOUNTER — Emergency Department (HOSPITAL_BASED_OUTPATIENT_CLINIC_OR_DEPARTMENT_OTHER): Admission: EM | Admit: 2008-02-14 | Discharge: 2008-02-14 | Payer: Self-pay | Admitting: Emergency Medicine

## 2008-11-11 ENCOUNTER — Emergency Department (HOSPITAL_BASED_OUTPATIENT_CLINIC_OR_DEPARTMENT_OTHER): Admission: EM | Admit: 2008-11-11 | Discharge: 2008-11-11 | Payer: Self-pay | Admitting: Emergency Medicine

## 2008-12-04 ENCOUNTER — Emergency Department (HOSPITAL_BASED_OUTPATIENT_CLINIC_OR_DEPARTMENT_OTHER): Admission: EM | Admit: 2008-12-04 | Discharge: 2008-12-04 | Payer: Self-pay | Admitting: Emergency Medicine

## 2008-12-04 ENCOUNTER — Ambulatory Visit: Payer: Self-pay | Admitting: Radiology

## 2009-01-02 IMAGING — CR DG CHEST 2V
2 series · 2 of 2 positions shown · non-contrast
Comparison: none

CLINICAL DATA: Chest pain and cough

Chest 2 view:
Comparison 11/13/2004. Linear atelectasis, scarring, or early infiltrate in the
right infrahilar region and lingula. Heart size remains normal. No effusion. No
overt interstitial edema.

[w chest pa]
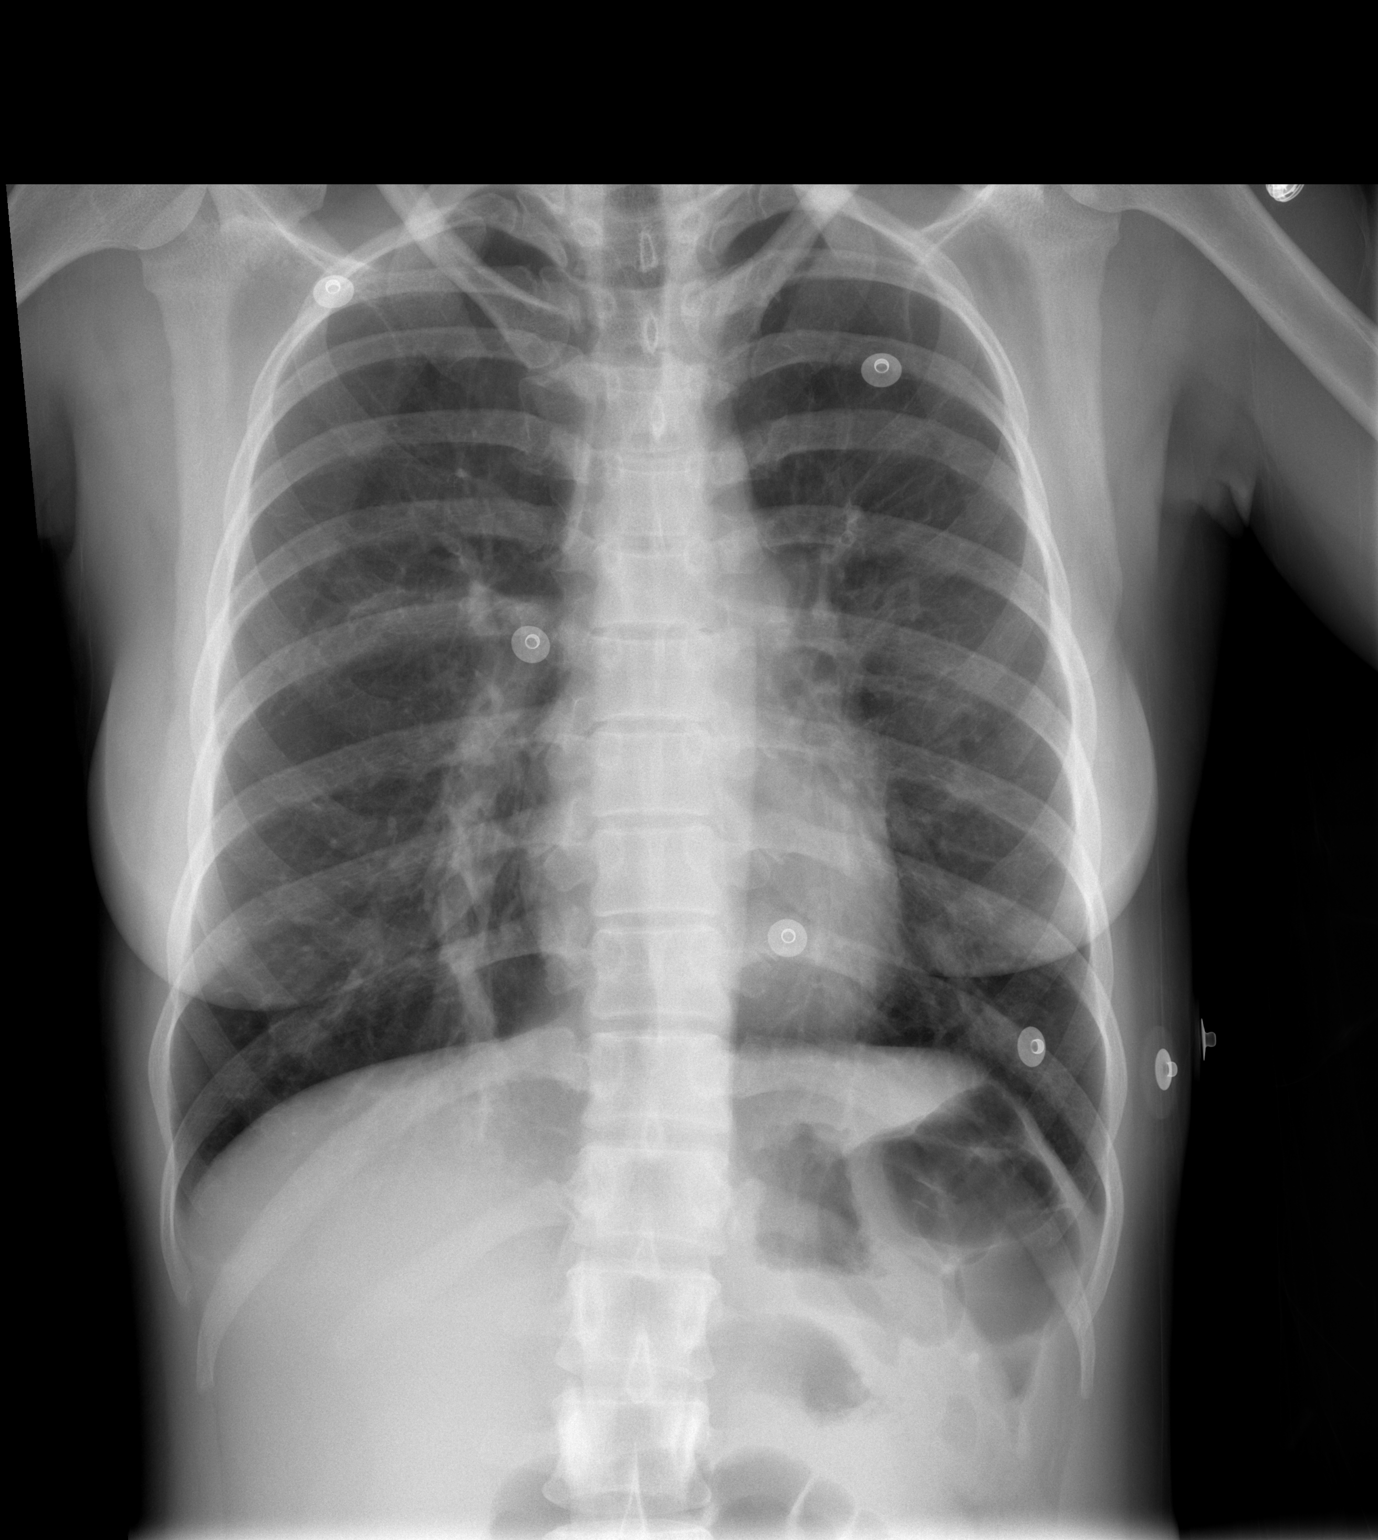

[w chest lat]
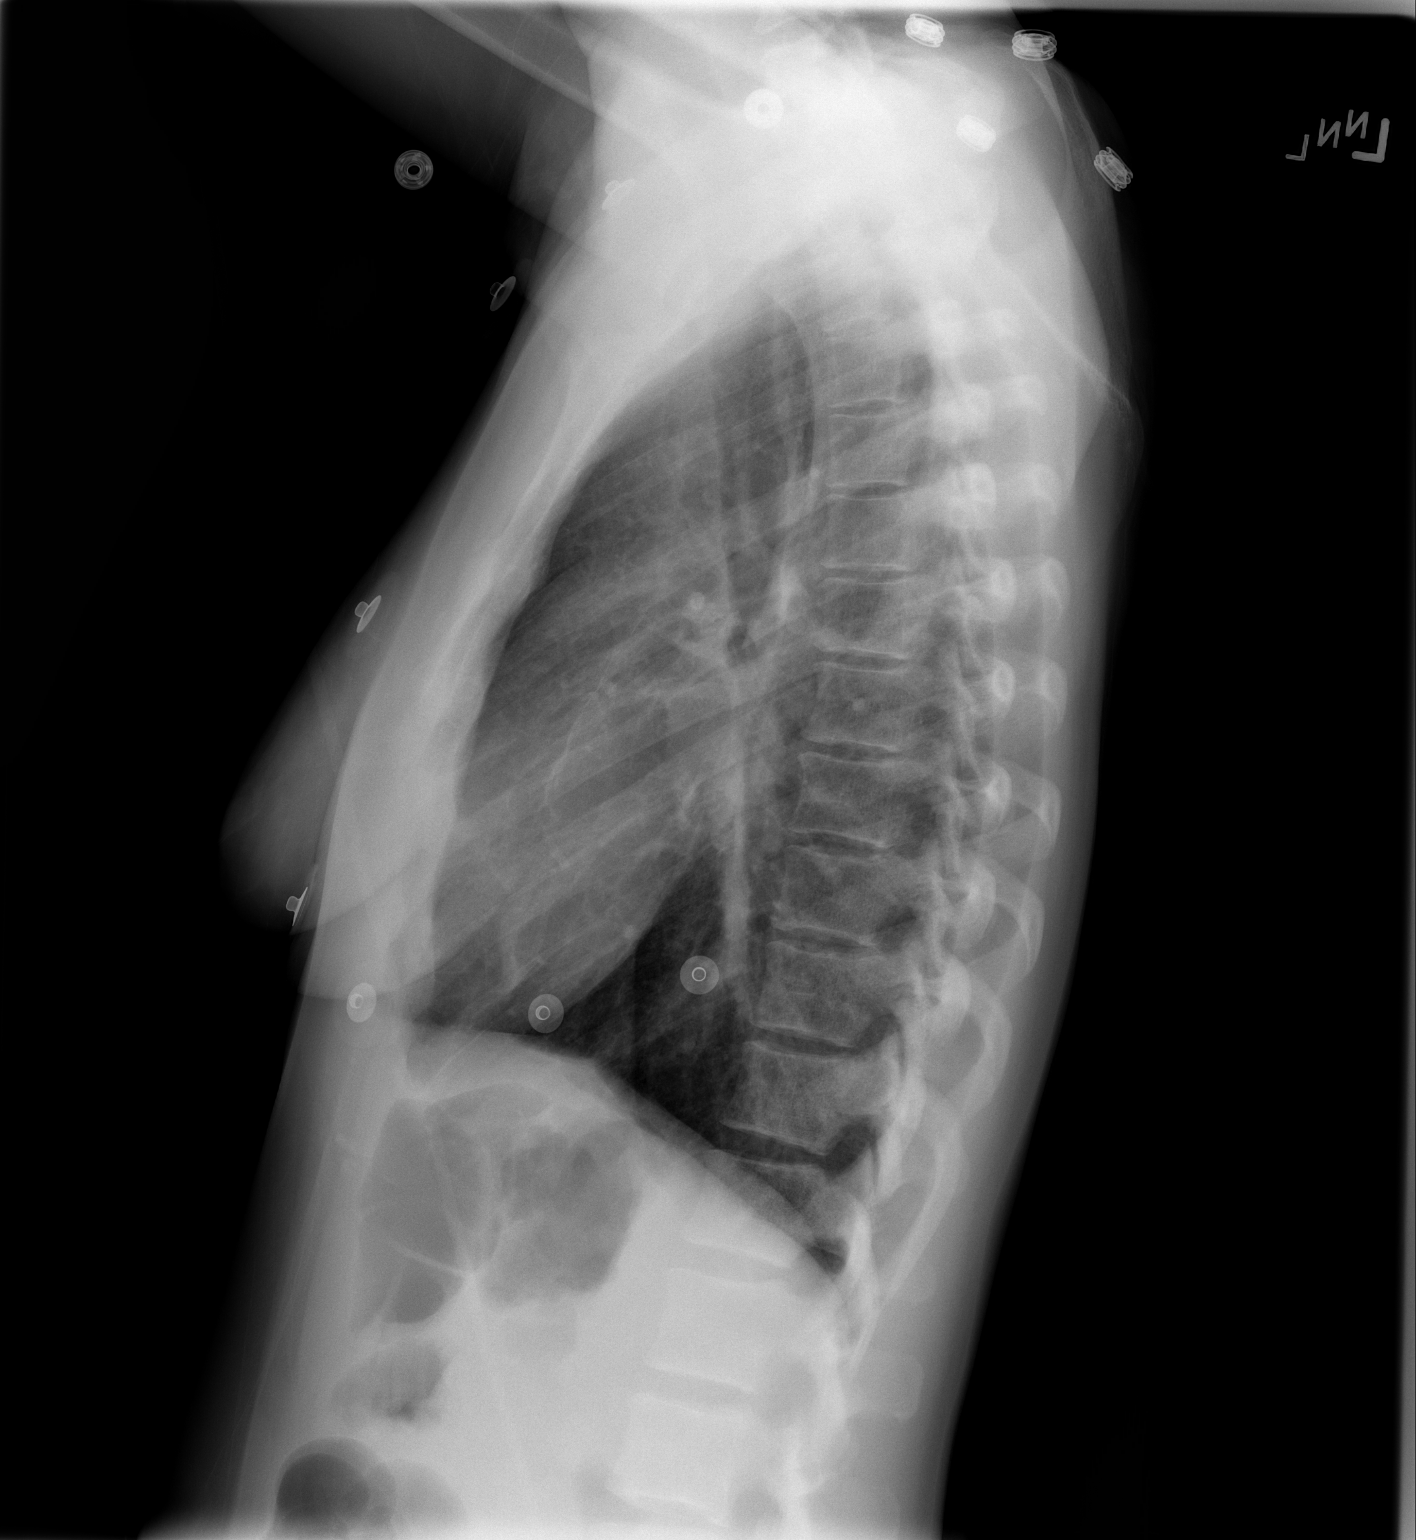

[2 of 2 positions shown; findings below may reference images not displayed]

IMPRESSION: 1. Linear patchy atelectasis, scarring, or early infiltrate, right lower lobe
and lingula

## 2009-08-27 ENCOUNTER — Emergency Department (HOSPITAL_BASED_OUTPATIENT_CLINIC_OR_DEPARTMENT_OTHER): Admission: EM | Admit: 2009-08-27 | Discharge: 2009-08-27 | Payer: Self-pay | Admitting: Emergency Medicine

## 2009-11-16 IMAGING — CR DG CHEST 2V
2 series · 2 of 2 positions shown · non-contrast
Comparison: 11/29/2006

CLINICAL DATA: Left anterior and posterior chest pain for 2 days.
No known injury.

CHEST - 2 VIEW

[w chest pa]
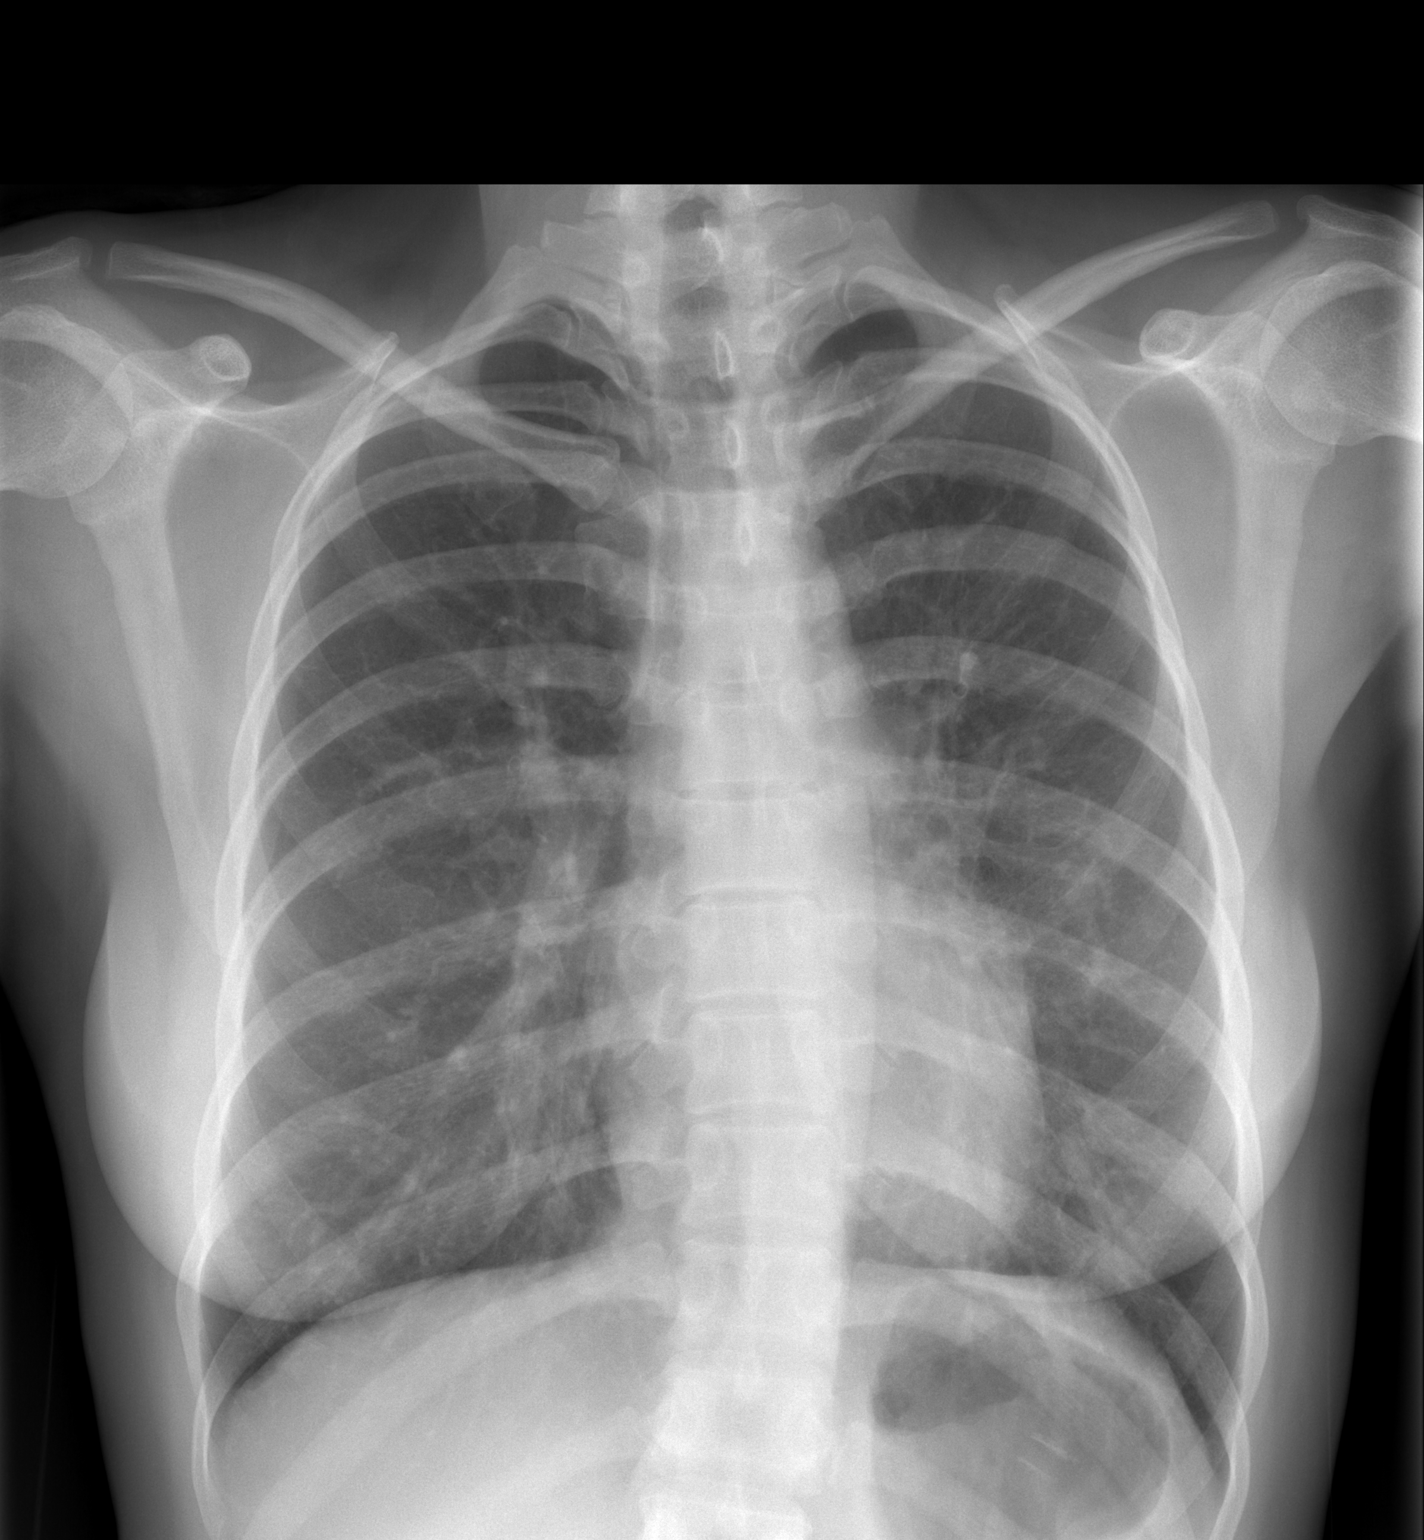

[w chest lat]
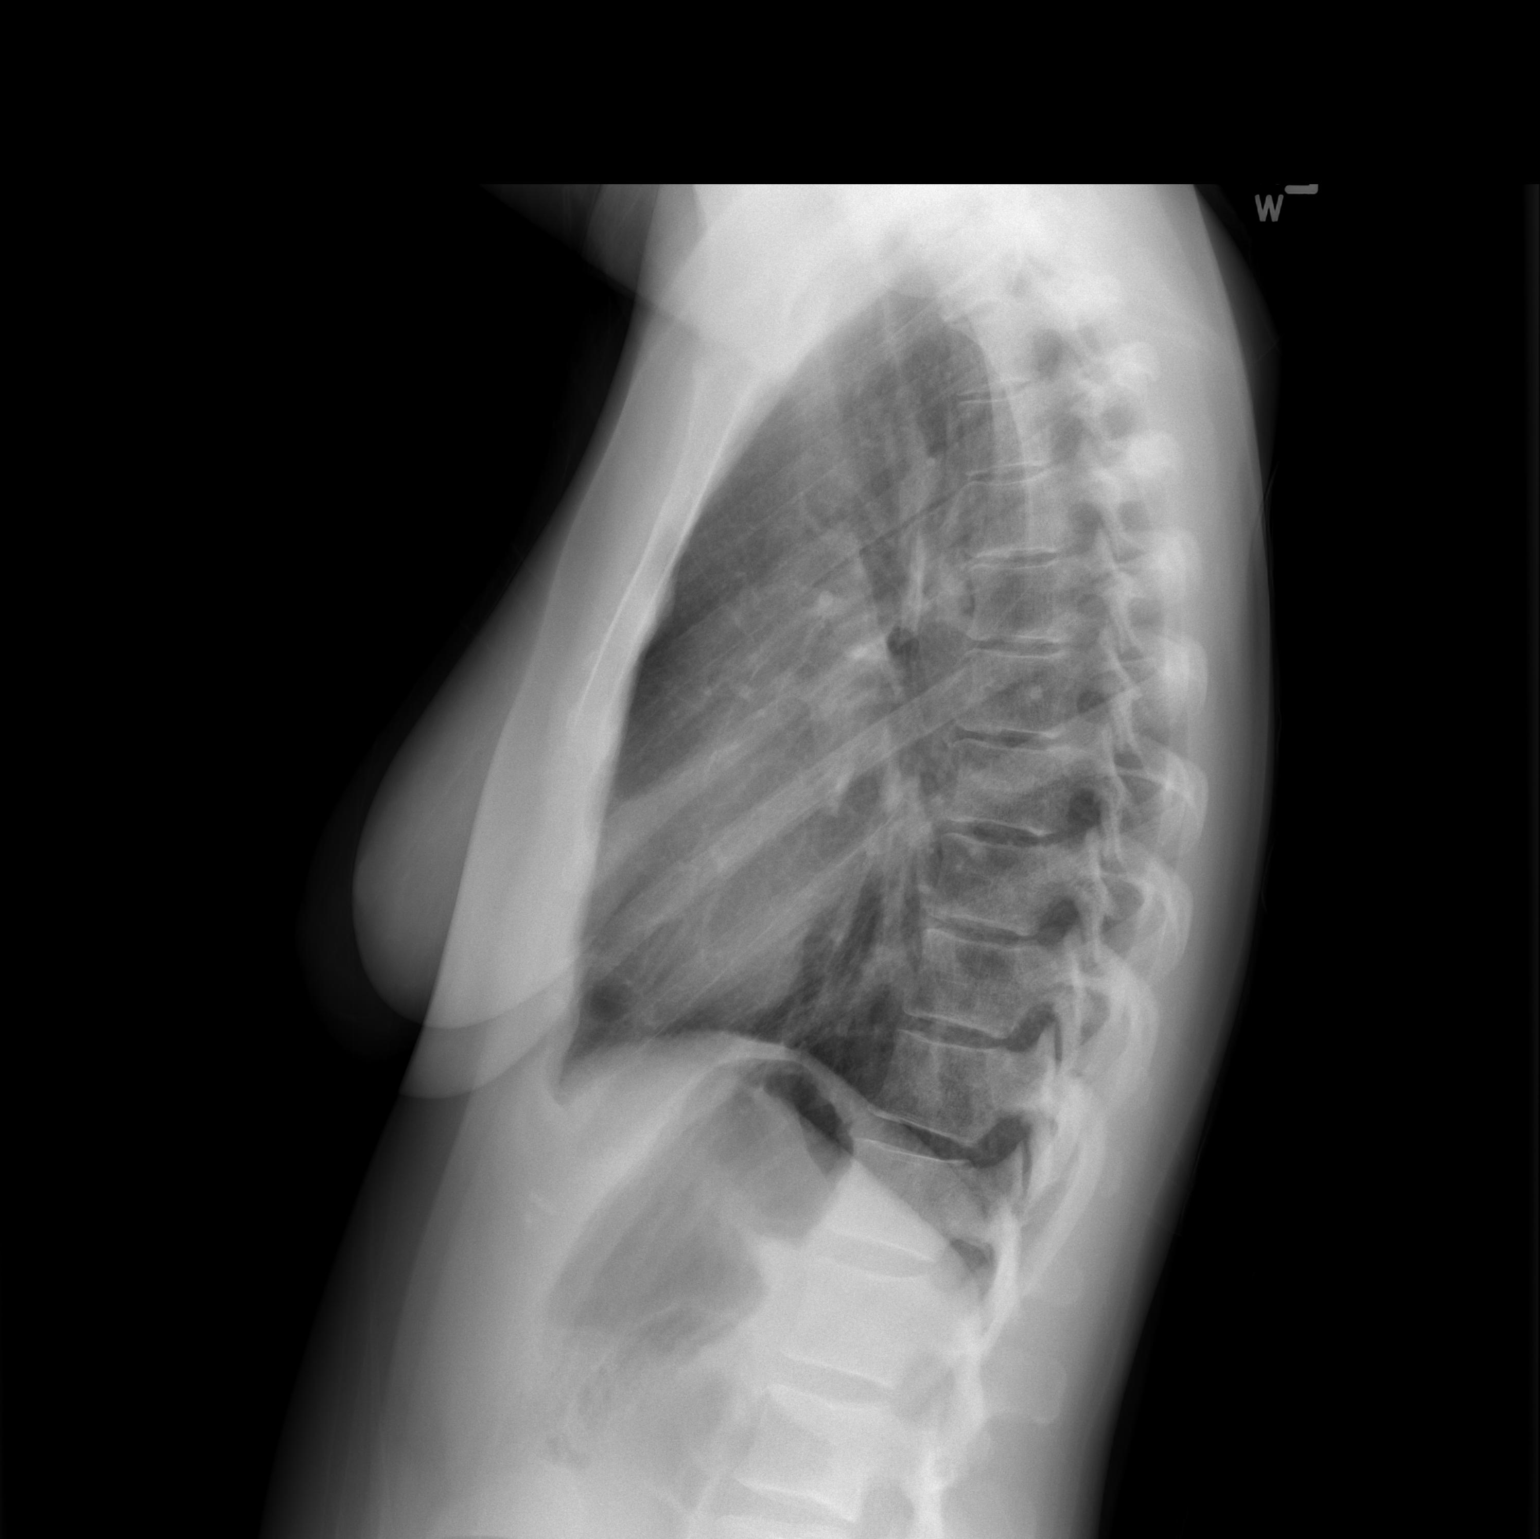

[2 of 2 positions shown; findings below may reference images not displayed]

FINDINGS: There are no acute infiltrates or effusions.  Heart size
and vascularity are normal.  No significant bony abnormality.
There is some slight scarring in the left mid zone and at the right
base medially.
IMPRESSION: No acute abnormalities.

## 2010-04-05 LAB — URINE CULTURE
Colony Count: >=100000
Culture  Setup Time: 201108081913

## 2010-04-05 LAB — URINALYSIS, ROUTINE W REFLEX MICROSCOPIC
Glucose, UA: NEGATIVE mg/dL
Protein, ur: NEGATIVE mg/dL
Urobilinogen, UA: 0.2 mg/dL (ref 0.0–1.0)
pH: 5 (ref 5.0–8.0)

## 2010-04-05 LAB — URINE MICROSCOPIC-ADD ON

## 2010-05-06 LAB — PREGNANCY, URINE: Preg Test, Ur: NEGATIVE

## 2010-06-07 NOTE — Discharge Summary (Signed)
NAME:  Angela Page, Angela Page                       ACCOUNT NO.:  [id number]   MEDICAL RECORD NO.:  [id number]                   PATIENT TYPE:  INP   LOCATION:  0354                                 FACILITY:  [facility]   PHYSICIAN:  Isla Pence, M.D.             DATE OF BIRTH:  1970/12/28   DATE OF ADMISSION:  05/31/2003  DATE OF DISCHARGE:  06/09/2003                                 DISCHARGE SUMMARY   DISCHARGE DIAGNOSES:  1. Community-acquired pneumonia complicated with nosocomial pneumonia.  2. Asthma exacerbation secondary to the pneumonia.  3. Oral thrush secondary to the prednisone and Pulmicort.  4. Abnormal thyroid function tests with mixed picture with a TSH being low,     suggestive of hyperthyroidism but the free T4 being decreased.  This will     need followup in 2-3 months.  5. Episodic elevated blood pressures with no previous history of     hypertension.  6. Macrocytosis without any anemia but normal B12 and intermediate level of     folate.  7. Mild hyperglycemia secondary to steroids but this has not a significant     problem, especially with the steroid dose being tapered down.  8. 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  75. Status post appendectomy.  10.      Chronic tobacco abuse.  The patient has been informed about     discontinuing this.  11.      History of migraine headaches with episode of migraine headaches x1     during this hospital stay.   DISCHARGE MEDICATIONS:  1. Cipro 500 mg p.o. b.i.d. to be completed after the last dose of May 31.  2. Prednisone taper consisting 60 mg p.o. q.d., which will be for tomorrow,     since she received her dose today, and 40 mg p.o. q.d. x2 days, then 35     mg p.o. q.d. x2 days, then 30 mg p.o. q.d. x2 days, then 20 p.o. q.d. x2     days, then 10 p.o. q.d. x1 day, then discontinue.  3. Albuterol inhaler 2 puffs q.i.d. for the next 2 weeks, and then p.r.n. as     previously.  4. Diflucan 100 mg  p.o. q.d.  To be completed after the last dose on May     24th.  5. Percocet 5/325 1-2 tablets p.o. q.4-6h. p.r.n. pain.  6. Motrin over the counter 200 mg tablets 4 tablets t.i.d. with meals as     needed.  Patient has been advised to use Extra Strength Tylenol first     prior to the Motrin, since she has had some mild dyspepsia.  7.  She is     going to be going home with ranitidine 150 mg p.o. b.i.d. for her     stomach, as long as she is on prednisone.   DISCHARGE ACTIVITY:  As tolerated.   DISCHARGE DIET:  No restrictions.   SPECIAL INSTRUCTIONS:  Patient has been told that she needs to have a  followup on her thyroid tests in 2-3 months and a follow-up chest x-ray for  her pneumonia in four weeks.  For this reason, she has been recommended to  get a primary care physician.  Patient has initially tried to get Medicaid,  but she was turned down.  She is going to retry with Medicaid and also with  [facility], but in the meantime, I have encouraged her to get followup at  an urgent care for choice.  She has been using [facility]. At least for the  initial followup, I have advised her to be seen at [facility] urgent care,  which is where she got admitted from for this admission, to have this  followup next week to insure that her pneumonia and asthma are still doing  okay.  Patient is very intelligent and understands this.  She is fully aware  of her thyroid abnormality.   HOSPITAL COURSE:  This 40 year old white female with a history of asthma  that is intermittent only based on whether she gets sick or allergies was  admitted to the [facility] service with increasing shortness of  breath and pleuritic type of chest pain.  Patient was seen at the [facility]  urgent care center and was initially treated with nebulizers and given  Advair to go home with.  Unfortunately, she did not do adequately well at  home and was subsequently seen back again at [facility], from which she  got  admitted to the service.  Patient was noted to have bibasilar infiltrates.  Because of the complaints of the left-sided pleuritic chest pain, a CT scan  was obtained to rule out a PE, and this was negative; however, the CT scan  did show diffuse patchiness in all of her lung fields, but the bibasilar  infiltrates and the right middle lobe infiltrate.  The patient was started  on IV Avelox, O2 nebulizer treatments, and IV Solu-Medrol.  She took quite a  few days to respond to these; however, a repeat white count done on the 18th  of May showed that she had increased her leukocytosis to 16.1 thousand from  13.6 thousand, which was done on the 16th of May.  It was felt at that time  that maybe she was developing more of a nosocomial pneumonia; therefore, she  was switched to Cipro because of a penicillin allergy.  With this regimen,  her white count is on the downward trend and is 14.6 with a normal  differential.  Her repeat chest x-ray, which was done on the 17th of May,  comparing it to the 14th of May chest x-ray, showed no significant change in  the bilateral perihilar and lower lobe air space disease/pneumonia.  The  14th of May chest x-ray had shown the patchy pneumonia in both lungs, most  confluent in the lingular area, which was unchanged from the CT that was  done the day prior.  They had reported that this chest x-ray finding was  worse than her admission chest x-ray on May 11th.  With the patient in  agreement, we did HIV testing, because of the diffuse nature of her  pneumonia, just to make sure there was no underlying HIV type of disease.  We were also primarily concerned since the husband is a IT trainer; however,  they have been monogamous, and HIV test came back negative.  As  mentioned  earlier, clinically, she took quite a few days to respond, but by 2-3 days  prior to discharge, her clinical exam started improving with decreased rhonchi and expiratory wheezes with  improved breath sounds.  Today on the  day of discharge, which I felt fairly comfortable discharging her without  having to see how she does on oral Cipro, she is doing markedly well with  good breath sounds in all of her lung fields.  She has very minimal crackles  on the right base.  Because of the viability of oral Cipro's equivalent to  the IV Cipro, I felt fairly comfortable with the downward trend on the white  count that she is stable enough for discharge today.   The admitting doctor felt slight thyromegaly; therefore, thyroid function  tests were done, and these came back showing a mixed picture with her TSH  being 0.17, suggestive of hyperthyroidism; however, her free T4 was  decreased at 0.83.  So her T3/free T3 was also decreased.  The T3 uptake was  slightly decreased at 42.9.  There were no discrete nodules found on exam.  We have not obtained thyroid scan, especially in light of the fact that she  had received contrast material, thyroid scan will not be obtained; however,  her TFTs are also a mixed picture and therefore the recommendation was made  to the patient that she needs to have this blood work for her thyroid done  in approximately 2-3 months.  Of note, also is the CT of the chest that was  done on the 15th of May, which usually catches the lower portions of the  neck.  Does not report any thyroid abnormality.   She was also noted to have macrocytosis on her CBC; therefore, she underwent  B12, folate, and the usual iron studies, and all of these have come back  fairly normal with B12 being normal at 308.  Her folate was at 5.4, which  according to the lab, is in the intermediate level.  Retic count is normal.  Her serum iron was normal at 137.  Total iron binding capacity was 295%,  saturation 45%.  Her ferritin level was also normal at 324.  I recommended  to the patient that she do a multivitamin with folate once a day.  This can  be followed up as an  outpatient.   During her stay here, her initial blood pressure was fine, and then  subsequently, we started seeing some elevated episodic blood pressure  ranges.  There would be a discrepancy between the automated and the manual.  She has no previous history of hypertension, and it was felt there might be  some underlying stress.  She is a pleasant lady, but she was getting  stressed out with her blood pressure being elevated.  We have recommended  that this be followed as an outpatient to see how she does with ambulatory  blood pressure checks.  No treatments were initiated.   She does have a history of migraine headaches, which rarely is an issue for  her; however, she did have an episode of migraine headache unrelated to her  hypertension, for which she received Imitrex and Motrin, I believe, which  has helped.  She has not had any further episodes of hypertension, in spite  of some of the documented high blood pressure.  Although she notes that once she notes the blood pressure is high, then she gets a headache.   DISCHARGE LABS:  As  mentioned earlier, the CBC, as previously mentioned in  the first paragraph.  Her BMP done on the 16th showed a sodium of 138,  potassium 4.8, chloride and CO2 are 100 and 32, glucose 123, BUN and  creatinine 12 and 0.9, calcium 9.6.   Patient is being discharged to home in stable condition.  Clinically much  improved.  I feel very comfortable with this patient going home.  She is  fairly intelligent and knows what she needs to do if she gets into trouble.   Also of note, the hospital was able to help her with getting all of the  prescription medications that she needs and instead of ranitidine, we will  be giving her Pepcid 20 mg p.o. b.i.d.                                               Isla Pence, M.D.    RRV/MEDQ  D:  06/09/2003  T:  06/09/2003  Job:  [id number]

## 2010-06-07 NOTE — Op Note (Signed)
[facility]  Patient:    Angela Page, Angela Page Visit Number: [id number] MRN: [id number]          Service Type: OBS Location: 910A 9101 01 Attending Physician:  Mickle Mallory Dictated by:   Gerrit Friends. Aldona Bar, M.D. Proc. Date: 12/29/00 Admit Date:  12/29/2000                             Operative Report  PREOPERATIVE DIAGNOSES:       1. Arrest disorder of dilatation at 8 cm.                               2. Meconium-stained amniotic fluid.                               3. Term pregnancy.  POSTOPERATIVE DIAGNOSES:      1. Arrest disorder of dilatation at 8 cm.                               2. Meconium-stained amniotic fluid.                               3. Term pregnancy.                               4. Delivery of 7 lb 7 oz female infant, Apgars                                  4 and 8. SURGEON:                      Gerrit Friends. Aldona Bar, M.D.  ANESTHESIA:                   Epidural.  HISTORY:                      This primigravida presented in early labor at term after a benign pregnancy on the morning of December 10.  At the time of admission, she was 3 cm dilated with a vertex at -1 station.  By noon, she had progressed to 5 cm dilated at 90% effaced with a vertex again at -1 and amniotomy was carried out with production of meconium-stained amniotic fluid. An IUPC was placed for amnio infusion.  Pitocin was required and the patient progressed well to 8 cm at approximately 2:30 p.m.  Thereafter, in spite of continued good labor as documented by the IUPC, the patient had no further progression.  Indeed, at 6:00 p.m. when she was examined, the cervix was still 8 cm at best, the anterior lip was very swollen and the vertex did not fit the cervix well at all in between contractions.  Fetal heart remained good.  The decision was made to proceed with a low transverse cesarean section for failure to progress.  The patient did, in addition, have a fever of  100.3 just prior to the decision to proceed to the operating room.  DESCRIPTION OF PROCEDURE:     The patient was taken to the  operating room with a Foley catheter in place.  Once in the operating room, she was prepped and draped in the usual fashion, having been placed in the supine position slightly tilted to the left.  After she was adequately draped, anesthetic levels were checked and noted to be adequate for surgery.  Thereafter, the procedure was begun.  A Pfannenstiel incision was made and, with minimal difficulty, dissected down sharply to and through the fascia in a low transverse fashion, with hemostasis created at each layer.  A subfascial space was created inferiorly and superiorly, the muscles separated in the midline, the peritoneum identified and entered appropriately, with care taken to avoid the bowel superiorly and the bladder inferiorly.  At this time, the vesicouterine peritoneum was identified, incised in a low transverse fashion and pushed off the lower uterine segment with ease.  Sharp incision into the uterine wall in a low transverse fashion was then made with Metzenbaum scissors and extended laterally with the fingers.  There was still some very faintly meconium-stained amniotic fluid noted.  Delivery of the vertex at this time was carried out.  The baby was DeLee suctioned and thereafter delivered. After the cord was clamped and cut, the infant was passed off to the awaiting team.  No meconium was noted below the cords.  Thereafter, the infant cried spontaneously.  Apgars were assigned at 4 and 8. Subsequent weight was noted to be 7 lb 7 oz.  At this time, the placenta was delivered intact.  The placenta was sent to pathology appropriately labeled.  At this time, the uterus was exteriorized, manually inspected and rendered free of any remaining products of conception.  Thereafter, with good uterine contractility afforded by manual stimulation and slowly given  intravenous Pitocin, closure of the uterine incision was carried out using #1 Vicryl in a running locking fashion.  Additional figure-of-eight #1 Vicryl was applied for additional hemostasis.  At this time, the uterine incision was noted to be dry.  There were some significant fibroids measuring approximately 4 cm in the fundal portion of the uterus and on the anterior wall of the uterus.  The tubes and ovaries were inspected and noted to be normal.  At this time, the uterus was replaced into the abdomen.  The abdomen was lavaged off all free blood and clot.  Thereafter, closure of the abdomen was begun after all counts were noted to be correct and no foreign bodies were noted to be remaining in the abdominal cavity.  The abdominal peritoneum was closed with 0 Vicryl in a running fashion.  The muscles were secured with the same.  Assured of good subfascial hemostasis, the fascia was reapproximated with 0 Vicryl from angle to midline bilaterally.  The subcu tissue was then rendered hemostatic and staples were then used to close the skin.  A sterile pressure dressing was applied.  At this time, the patient was transported to the recovery room in satisfactory condition, having tolerated the procedure well.  ESTIMATED BLOOD LOSS:         500 cc.  COUNTS:                       Correct x 2.  DISPOSITION:                  In the recovery room, the patient was satisfactory, as was the baby in the nursery.  SUMMARY:  This patient underwent a primary low transverse cesarean section because of an arrest disorder at 8 cm of dilatation.  There was thick meconium-stained amniotic fluid noted upon rupture of membranes at 5 cm dilatation, but amnio infusion cleared that and the patient was delivered of a 7 lb 7 oz female infant with Apgars of 4 and 8.  All counts were correct x 2.  Condition in the recovery room of both mother and baby was satisfactory. Dictated by:   Gerrit Friends.  Aldona Bar, M.D. Attending Physician:  Mickle Mallory DD:  12/29/00 TD:  12/30/00 Job: [id number]

## 2010-06-07 NOTE — Discharge Summary (Signed)
[facility]  Patient:    Angela Page, Angela Page Visit Number: [id number] MRN: [id number]          Service Type: OBS Location: 910A 9111 01 Attending Physician:  Mickle Mallory Dictated by:   Gerrit Friends. Aldona Bar, M.D. Admit Date:  12/29/2000 Discharge Date: 01/01/2001                             Discharge Summary  DISCHARGE DIAGNOSES:          1. Term pregnancy delivered 7 pound 7 ounce                                  female infant.  Apgars 4 and 8.                               2. Blood type A positive.                               3. Meconium stained amniotic fluid.                               4. Arrest disorder of dilatation at 8 cm.  PROCEDURE:                    Primary low transverse cesarean section.  SUMMARY:                      This primigravida presented in early labor after a benign pregnancy on the morning of December 29, 2000.  She progressed during her labor - amniotomy was carried out with reduction of meconium stained amniotic fluid.  An IUPC was placed and she underwent amnioinfusion. Pitocin augmentation was required and thereafter inspite of good continued Pitocin and contractions she had no further progression beyond 8 cm, where she remained for approximately three hours and just prior to making the decision to perform the cesarean section for an arrest disorder, her anterior lip of the cervix was noted to be very swollen.  The vertex did not fit the cervix well at all. She did undergo a primary low transverse cesarean section for failure to progress with delivery of a 7 pound 7 ounce female infant with Apgars of 4 and 8.  The neonatal team was present at the time of delivery and no meconium was noted below the cords and the infant was DeLee suctioned upon delivery of the vertex.  Postoperatively, the patient did well, although she did have a postoperative fever on the night of surgery.  She was begun on Clindamycin because  of a PENICILLIN allergy - this was changed from Ancef which was used for prophylaxis.  She was maintained on Clindamycin through December 31, 2000. Essentially after the morning of December 30, 2000 she was afebrile.  Her discharge hemoglobin on December 31, 2000 was 9.8 with a white count of 13,100 and a platelet count of 244,000.  On the morning of January 01, 2001 she was ambulating well, tolerating a regular diet well, having normal bowel and bladder function and was afebrile.  She was breast feeding without difficulty. Her wound was clean and dry and she was ready for discharge.  On the morning of discharge her staples were removed and the wound was Steri-Striped. She was given all appropriate instructions on the morning of discharge as well as prescriptions for Tylox to use one to two every four to six hours as needed for severe pain, Motrin every six hours 600 mg as needed for lesser pain, vitamins one a day and ferrous sulfate 300 mg daily.  She will return to the office for follow-up in approximately four weeks time.  She was given all appropriate instructions at the time of discharge and understood all instructions well.  CONDITION ON DISCHARGE:       Improved. Dictated by:   Gerrit Friends. Aldona Bar, M.D. Attending Physician:  Mickle Mallory DD:  01/01/01 TD:  01/01/01 Job: [id number]

## 2010-09-12 ENCOUNTER — Encounter: Payer: Self-pay | Admitting: *Deleted

## 2010-09-12 ENCOUNTER — Emergency Department (HOSPITAL_BASED_OUTPATIENT_CLINIC_OR_DEPARTMENT_OTHER)
Admission: EM | Admit: 2010-09-12 | Discharge: 2010-09-13 | Disposition: A | Discharge status: Home / Self Care | Payer: Self-pay | Attending: Emergency Medicine | Admitting: Emergency Medicine

## 2010-09-12 DIAGNOSIS — K279 Peptic ulcer, site unspecified, unspecified as acute or chronic, without hemorrhage or perforation: Secondary | ICD-10-CM

## 2010-09-12 DIAGNOSIS — R1013 Epigastric pain: Secondary | ICD-10-CM | POA: Insufficient documentation

## 2010-09-12 DIAGNOSIS — F17200 Nicotine dependence, unspecified, uncomplicated: Secondary | ICD-10-CM | POA: Insufficient documentation

## 2010-09-12 LAB — DIFFERENTIAL
Basophils Absolute: 0.1 10*3/uL (ref 0.0–0.1)
Monocytes Relative: 8 % (ref 3–12)
Neutro Abs: 9 10*3/uL — ABNORMAL HIGH (ref 1.7–7.7)
Neutrophils Relative %: 65 % (ref 43–77)

## 2010-09-12 LAB — URINALYSIS, ROUTINE W REFLEX MICROSCOPIC
Glucose, UA: NEGATIVE mg/dL
Hgb urine dipstick: NEGATIVE
Leukocytes, UA: NEGATIVE
Protein, ur: NEGATIVE mg/dL
Urobilinogen, UA: 0.2 mg/dL (ref 0.0–1.0)
pH: 5.5 (ref 5.0–8.0)

## 2010-09-12 LAB — CBC
HCT: 40.4 % (ref 36.0–46.0)
Platelets: 326 10*3/uL (ref 150–400)
RDW: 12.4 % (ref 11.5–15.5)

## 2010-09-12 NOTE — ED Notes (Signed)
Upper abd pain x 3 weeks, denies vomiting or diarrhea

## 2010-09-12 NOTE — ED Notes (Signed)
Pt reports constant epigastric pains x3 weeks. Severity of it waxes/wanes. C/o increased belching. Pain described as "gnawing" and burning. Worsens after eating, although no particular food type is worse than another. Denies vomiting or diarrhea, but does have some nausea. Taking OTC TUMS and antacids without relief. Pt has been under a lot of recent stress. No hx of known gastric ulcers.

## 2010-09-13 LAB — BASIC METABOLIC PANEL
BUN: 11 mg/dL (ref 6–23)
Chloride: 99 mEq/L (ref 96–112)
Creatinine, Ser: 0.6 mg/dL (ref 0.50–1.10)
GFR calc Af Amer: >60 mL/min (ref >60)
Sodium: 135 mEq/L (ref 135–145)

## 2010-09-13 LAB — HEPATIC FUNCTION PANEL
AST: 18 U/L (ref 0–37)
Albumin: 3.5 g/dL (ref 3.5–5.2)
Total Bilirubin: 0.2 mg/dL — ABNORMAL LOW (ref 0.3–1.2)

## 2010-09-13 MED ORDER — SUCRALFATE 1 G PO TABS: ORAL_TABLET | ORAL | Status: DC

## 2010-09-13 MED ORDER — HYDROCODONE-ACETAMINOPHEN 5-325 MG PO TABS: 1.0000 | ORAL_TABLET | Freq: Four times a day (QID) | ORAL | Status: AC | PRN

## 2010-09-13 MED ORDER — PANTOPRAZOLE SODIUM 40 MG IV SOLR
40.0000 mg | Freq: Once | INTRAVENOUS | Status: AC
  Administered 2010-09-13: 40 mg via INTRAVENOUS
  Filled 2010-09-13: qty 40

## 2010-09-13 MED ORDER — OMEPRAZOLE 20 MG PO CPDR: 20.0000 mg | DELAYED_RELEASE_CAPSULE | Freq: Every day | ORAL | Status: DC

## 2010-09-13 MED ORDER — FENTANYL CITRATE 0.05 MG/ML IJ SOLN
100.0000 ug | Freq: Once | INTRAMUSCULAR | Status: AC
  Administered 2010-09-13: 100 ug via INTRAVENOUS
  Filled 2010-09-13: qty 2

## 2010-09-13 MED ORDER — GI COCKTAIL ~~LOC~~
30.0000 mL | Freq: Once | ORAL | Status: AC
  Administered 2010-09-13: 30 mL via ORAL
  Filled 2010-09-13: qty 30

## 2010-09-13 MED ORDER — ONDANSETRON HCL 4 MG/2ML IJ SOLN
4.0000 mg | Freq: Once | INTRAMUSCULAR | Status: AC
  Administered 2010-09-13: 4 mg via INTRAVENOUS
  Filled 2010-09-13: qty 2

## 2010-09-13 MED ORDER — SUCRALFATE 1 G PO TABS
1.0000 g | ORAL_TABLET | Freq: Once | ORAL | Status: AC
  Administered 2010-09-13: 1 g via ORAL
  Filled 2010-09-13: qty 1

## 2010-09-13 NOTE — ED Provider Notes (Signed)
History     CSN: [id number] Arrival date & time: 09/12/2010 11:19 PM  Chief Complaint  Patient presents with  . Abdominal Pain   HPI  this is a 40 year old white female with a 5 week history of epigastric pain. He did get intermittent until about 3 weeks ago at which point he became more steady. It is worsened to the point of severity today. Described as a 9 pain in the epigastrium that sometimes radiates into the substernal area. It is sometimes associated with an acid sensation in the throat. There's been some nausea but no vomiting or diarrhea. Is not relieved by food and is not relieved by Alka-Seltzer, Pepto-Bismol, pounds, aspirin or Aleve.  History reviewed. No pertinent past medical history.  Past Surgical History  Procedure Date  . Appendectomy   . Cardiac surgery     History reviewed. No pertinent family history.  History  Substance Use Topics  . Smoking status: Current Everyday Smoker -- 1.0 packs/day  . Smokeless tobacco: Not on file  . Alcohol Use: Yes    OB History    Grav Para Term Preterm Abortions TAB SAB Ect Mult Living                  Review of Systems  All other systems reviewed and are negative.    Physical Exam  BP 173/90  Pulse 98  Temp(Src) 99 F (37.2 C) (Oral)  Resp 18  Ht 5\' 2"  (1.575 m)  Wt 115 lb (52.164 kg)  BMI 21.03 kg/m2  SpO2 99%  LMP 09/08/2010  Physical Exam General: Well-developed, well-nourished female in no acute distress; appearance consistent with age of record; appears uncomfortable HENT: normocephalic, atraumatic Eyes: pupils equal round and reactive to light; extraocular muscles intact Neck: supple Heart: regular rate and rhythm Lungs: clear to auscultation bilaterally Abdomen: soft; nontender; nondistended; no masses or hepatosplenomegaly; bowel sounds present Extremities: No deformity; full range of motion; pulses normal Neurologic: Awake, alert and oriented;motor function intact in all extremities and  symmetric; no facial droop Skin: Warm and dry Psychiatric: Tearful   ED Course  Procedures  MDM 12:53 AM Only minor relief with GI cocktail.  1:43 AM Improved relief with IV Protonix and fentanyl as well as by mouth Carafate. Nursing notes and vitals signs, including pulse oximetry, reviewed.  Summary of this visit's results, reviewed by myself:  Labs:  Results for orders placed during the hospital encounter of 09/12/10  CBC      Component Value Range   WBC 13.9 (*) 4.0 - 10.5 (K/uL)   RBC 4.06  3.87 - 5.11 (MIL/uL)   Hemoglobin 14.5  12.0 - 15.0 (g/dL)   HCT 56.2  13.0 - 86.5 (%)   MCV 99.5  78.0 - 100.0 (fL)   MCH 35.7 (*) 26.0 - 34.0 (pg)   MCHC 35.9  30.0 - 36.0 (g/dL)   RDW 78.4  69.6 - 29.5 (%)   Platelets 326  150 - 400 (K/uL)  DIFFERENTIAL      Component Value Range   Neutrophils Relative 65  43 - 77 (%)   Neutro Abs 9.0 (*) 1.7 - 7.7 (K/uL)   Lymphocytes Relative 24  12 - 46 (%)   Lymphs Abs 3.4  0.7 - 4.0 (K/uL)   Monocytes Relative 8  3 - 12 (%)   Monocytes Absolute 1.2 (*) 0.1 - 1.0 (K/uL)   Eosinophils Relative 2  0 - 5 (%)   Eosinophils Absolute 0.3  0.0 - 0.7 (K/uL)  Basophils Relative 0  0 - 1 (%)   Basophils Absolute 0.1  0.0 - 0.1 (K/uL)  BASIC METABOLIC PANEL      Component Value Range   Sodium 135  135 - 145 (mEq/L)   Potassium 3.5  3.5 - 5.1 (mEq/L)   Chloride 99  96 - 112 (mEq/L)   CO2 24  19 - 32 (mEq/L)   Glucose, Bld 138 (*) 70 - 99 (mg/dL)   BUN 11  6 - 23 (mg/dL)   Creatinine, Ser 4.54  0.50 - 1.10 (mg/dL)   Calcium 9.8  8.4 - 09.8 (mg/dL)   GFR calc non Af Amer >60  >60 (mL/min)   GFR calc Af Amer >60  >60 (mL/min)  LIPASE, BLOOD      Component Value Range   Lipase 46  11 - 59 (U/L)  PREGNANCY, URINE      Component Value Range   Preg Test, Ur NEGATIVE    URINALYSIS, ROUTINE W REFLEX MICROSCOPIC      Component Value Range   Color, Urine YELLOW  YELLOW    Appearance CLEAR  CLEAR    Specific Gravity, Urine 1.011  1.005 - 1.030     pH 5.5  5.0 - 8.0    Glucose, UA NEGATIVE  NEGATIVE (mg/dL)   Hgb urine dipstick NEGATIVE  NEGATIVE    Bilirubin Urine NEGATIVE  NEGATIVE    Ketones, ur NEGATIVE  NEGATIVE (mg/dL)   Protein, ur NEGATIVE  NEGATIVE (mg/dL)   Urobilinogen, UA 0.2  0.0 - 1.0 (mg/dL)   Nitrite NEGATIVE  NEGATIVE    Leukocytes, UA NEGATIVE  NEGATIVE   HEPATIC FUNCTION PANEL      Component Value Range   Total Protein 7.0  6.0 - 8.3 (g/dL)   Albumin 3.5  3.5 - 5.2 (g/dL)   AST 18  0 - 37 (U/L)   ALT 12  0 - 35 (U/L)   Alkaline Phosphatase 54  39 - 117 (U/L)   Total Bilirubin 0.2 (*) 0.3 - 1.2 (mg/dL)   Bilirubin, Direct 0.1  0.0 - 0.3 (mg/dL)   Indirect Bilirubin 0.1 (*) 0.3 - 0.9 (mg/dL)    Imaging Studies: No results found.  History consistent with gastritis or peptic ulcer disease. We'll start on proton pump inhibitor therapy and Carafate and refer to gastroenterology. Patient was advised to limit alcohol coffee and tobacco products as well as any other foods that seem to exacerbate symptoms.     Hanley Seamen, MD 09/13/10 [phone]

## 2010-10-16 LAB — URINE MICROSCOPIC-ADD ON

## 2010-10-16 LAB — URINALYSIS, ROUTINE W REFLEX MICROSCOPIC
Bilirubin Urine: NEGATIVE
Glucose, UA: NEGATIVE
Ketones, ur: NEGATIVE
pH: 5.5

## 2010-10-16 LAB — DIFFERENTIAL
Basophils Absolute: 0.2 — ABNORMAL HIGH
Basophils Relative: 2 — ABNORMAL HIGH
Eosinophils Absolute: 0.2
Eosinophils Relative: 2
Monocytes Absolute: 0.5

## 2010-10-16 LAB — CBC
HCT: 43.4
Hemoglobin: 15.2 — ABNORMAL HIGH
MCHC: 35
MCV: 102.9 — ABNORMAL HIGH
RDW: 12.9

## 2010-10-16 LAB — WET PREP, GENITAL
Trich, Wet Prep: NONE SEEN
Yeast Wet Prep HPF POC: NONE SEEN

## 2010-10-16 LAB — BASIC METABOLIC PANEL
CO2: 27
Calcium: 9.8
Glucose, Bld: 84
Sodium: 140

## 2010-10-21 LAB — DIFFERENTIAL
Basophils Absolute: 0.2 — ABNORMAL HIGH
Eosinophils Relative: 2
Lymphocytes Relative: 31
Lymphs Abs: 3.1
Neutro Abs: 5.9

## 2010-10-21 LAB — COMPREHENSIVE METABOLIC PANEL
AST: 21
BUN: 6
CO2: 26
Calcium: 9.9
Creatinine, Ser: 0.8
GFR calc Af Amer: >60
GFR calc non Af Amer: >60
Total Bilirubin: 0.3

## 2010-10-21 LAB — CBC
HCT: 40
MCHC: 35
MCV: 103.5 — ABNORMAL HIGH
Platelets: 285
RBC: 3.87

## 2010-10-21 LAB — D-DIMER, QUANTITATIVE: D-Dimer, Quant: <0.22

## 2010-10-21 LAB — LIPASE, BLOOD: Lipase: 61

## 2010-10-24 ENCOUNTER — Emergency Department (HOSPITAL_BASED_OUTPATIENT_CLINIC_OR_DEPARTMENT_OTHER)
Admission: EM | Admit: 2010-10-24 | Discharge: 2010-10-24 | Disposition: A | Discharge status: Home / Self Care | Payer: Self-pay | Attending: Emergency Medicine | Admitting: Emergency Medicine

## 2010-10-24 ENCOUNTER — Emergency Department (INDEPENDENT_AMBULATORY_CARE_PROVIDER_SITE_OTHER): Payer: Self-pay

## 2010-10-24 ENCOUNTER — Encounter (HOSPITAL_BASED_OUTPATIENT_CLINIC_OR_DEPARTMENT_OTHER): Payer: Self-pay | Admitting: *Deleted

## 2010-10-24 DIAGNOSIS — R059 Cough, unspecified: Secondary | ICD-10-CM | POA: Insufficient documentation

## 2010-10-24 DIAGNOSIS — J45909 Unspecified asthma, uncomplicated: Secondary | ICD-10-CM | POA: Insufficient documentation

## 2010-10-24 DIAGNOSIS — J189 Pneumonia, unspecified organism: Secondary | ICD-10-CM

## 2010-10-24 DIAGNOSIS — R0602 Shortness of breath: Secondary | ICD-10-CM | POA: Insufficient documentation

## 2010-10-24 DIAGNOSIS — J3489 Other specified disorders of nose and nasal sinuses: Secondary | ICD-10-CM

## 2010-10-24 DIAGNOSIS — R05 Cough: Secondary | ICD-10-CM

## 2010-10-24 DIAGNOSIS — R509 Fever, unspecified: Secondary | ICD-10-CM

## 2010-10-24 MED ORDER — DEXTROSE 5 % IV SOLN
1.0000 g | INTRAVENOUS | Status: DC
  Administered 2010-10-24: 1 g via INTRAVENOUS
  Filled 2010-10-24: qty 1

## 2010-10-24 MED ORDER — DEXTROSE 5 % IV SOLN
500.0000 mg | Freq: Once | INTRAVENOUS | Status: AC
  Administered 2010-10-24: 500 mg via INTRAVENOUS
  Filled 2010-10-24: qty 500

## 2010-10-24 MED ORDER — IBUPROFEN 800 MG PO TABS
800.0000 mg | ORAL_TABLET | Freq: Once | ORAL | Status: AC
  Administered 2010-10-24: 800 mg via ORAL
  Filled 2010-10-24: qty 1

## 2010-10-24 MED ORDER — ALBUTEROL SULFATE (5 MG/ML) 0.5% IN NEBU
INHALATION_SOLUTION | RESPIRATORY_TRACT | Status: AC
  Administered 2010-10-24: 5 mg
  Filled 2010-10-24: qty 1

## 2010-10-24 MED ORDER — IPRATROPIUM BROMIDE 0.02 % IN SOLN
RESPIRATORY_TRACT | Status: AC
  Administered 2010-10-24: 0.5 mg via RESPIRATORY_TRACT
  Filled 2010-10-24: qty 2.5

## 2010-10-24 MED ORDER — AZITHROMYCIN 250 MG PO TABS: 250.0000 mg | ORAL_TABLET | Freq: Every day | ORAL | Status: AC

## 2010-10-24 MED ORDER — ALBUTEROL SULFATE (5 MG/ML) 0.5% IN NEBU
5.0000 mg | INHALATION_SOLUTION | Freq: Once | RESPIRATORY_TRACT | Status: AC
  Administered 2010-10-24: 5 mg via RESPIRATORY_TRACT
  Filled 2010-10-24: qty 1

## 2010-10-24 MED ORDER — HYDROCODONE-ACETAMINOPHEN 5-325 MG PO TABS: 2.0000 | ORAL_TABLET | ORAL | Status: AC | PRN

## 2010-10-24 NOTE — ED Provider Notes (Signed)
History     CSN: [id number] Arrival date & time: 10/24/2010  2:47 PM  Chief Complaint  Patient presents with  . Cough  . Nasal Congestion  . Fever    (Consider location/radiation/quality/duration/timing/severity/associated sxs/prior treatment) Patient is a 40 y.o. female presenting with cough.  Cough This is a new problem. The current episode started more than 1 week ago. The problem occurs constantly. The problem has been gradually worsening. The cough is productive of sputum. The maximum temperature recorded prior to her arrival was 101 to 101.9 F. The fever has been present for 1 to 2 days. Associated symptoms include shortness of breath. She has tried nothing for the symptoms. The treatment provided moderate relief. Her past medical history is significant for bronchitis and asthma.  Pt complains of fever and shortness of breath.  Past Medical History  Diagnosis Date  . Asthma     Past Surgical History  Procedure Date  . Appendectomy   . Cardiac surgery     History reviewed. No pertinent family history.  History  Substance Use Topics  . Smoking status: Current Everyday Smoker -- 1.0 packs/day  . Smokeless tobacco: Not on file  . Alcohol Use: Yes    OB History    Grav Para Term Preterm Abortions TAB SAB Ect Mult Living                  Review of Systems  Respiratory: Positive for cough and shortness of breath.   All other systems reviewed and are negative.    Allergies  Erythromycin and Penicillins  Home Medications   Current Outpatient Rx  Name Route Sig Dispense Refill  . OMEPRAZOLE 20 MG PO CPDR Oral Take 1 capsule (20 mg total) by mouth daily.    . SUCRALFATE 1 G PO TABS  One by mouth with meals 3 times a day and at bedtime 40 tablet 0    BP 111/84  Pulse 94  Temp 99 F (37.2 C)  Resp 24  SpO2 97%  LMP 10/10/2010  Physical Exam  Vitals reviewed. Constitutional: She is oriented to person, place, and time. She appears well-developed and  well-nourished.  HENT:  Head: Normocephalic and atraumatic.  Eyes: Pupils are equal, round, and reactive to light.  Neck: Normal range of motion. Neck supple.  Cardiovascular: Normal rate.   Pulmonary/Chest: Effort normal and breath sounds normal.  Abdominal: Soft. There is tenderness.  Musculoskeletal: Normal range of motion.  Neurological: She is alert and oriented to person, place, and time.  Skin: Skin is warm and dry.    ED Course  Procedures (including critical care time)  Labs Reviewed - No data to display Dg Chest 2 View  10/24/2010  *RADIOLOGY REPORT*  Clinical Data: Fever.  Cough.  Congestion.  CHEST - 2 VIEW  Comparison: 10/13/2006.  Findings: Right middle lobe pneumonia is present with dense consolidation respecting the major fissure.  There is blurring of the right heart border.  There may be some airspace disease in the left lower lobe as well.  The cardiopericardial silhouette appears within normal limits.  No effusion.  Trachea midline.  Follow-up to ensure radiographic clearing recommended.  Clearing is usually observed at 4-6 weeks.  IMPRESSION: Right middle lobe pneumonia.  Original Report Authenticated By: Andreas Newport, M.D.     No diagnosis found.    MDM  Pt has pneumonia on chest xray,  Pt given Iv fluids, Rocephin and zithromax.  Pt breathing better after albuterol.  Pt counseled on  results        Langston Masker, [place] 10/24/10 1805

## 2010-10-27 ENCOUNTER — Encounter (HOSPITAL_BASED_OUTPATIENT_CLINIC_OR_DEPARTMENT_OTHER): Payer: Self-pay | Admitting: Emergency Medicine

## 2010-10-27 ENCOUNTER — Emergency Department (INDEPENDENT_AMBULATORY_CARE_PROVIDER_SITE_OTHER): Payer: Self-pay

## 2010-10-27 ENCOUNTER — Emergency Department (HOSPITAL_BASED_OUTPATIENT_CLINIC_OR_DEPARTMENT_OTHER)
Admission: EM | Admit: 2010-10-27 | Discharge: 2010-10-27 | Disposition: A | Discharge status: Home / Self Care | Payer: Self-pay | Attending: Emergency Medicine | Admitting: Emergency Medicine

## 2010-10-27 DIAGNOSIS — J189 Pneumonia, unspecified organism: Secondary | ICD-10-CM

## 2010-10-27 DIAGNOSIS — R079 Chest pain, unspecified: Secondary | ICD-10-CM

## 2010-10-27 DIAGNOSIS — R059 Cough, unspecified: Secondary | ICD-10-CM | POA: Insufficient documentation

## 2010-10-27 DIAGNOSIS — R509 Fever, unspecified: Secondary | ICD-10-CM

## 2010-10-27 DIAGNOSIS — R05 Cough: Secondary | ICD-10-CM

## 2010-10-27 DIAGNOSIS — F17200 Nicotine dependence, unspecified, uncomplicated: Secondary | ICD-10-CM | POA: Insufficient documentation

## 2010-10-27 DIAGNOSIS — J45901 Unspecified asthma with (acute) exacerbation: Secondary | ICD-10-CM | POA: Insufficient documentation

## 2010-10-27 MED ORDER — HYDROCODONE-ACETAMINOPHEN 5-325 MG PO TABS
1.0000 | ORAL_TABLET | Freq: Once | ORAL | Status: AC
  Administered 2010-10-27: 1 via ORAL
  Filled 2010-10-27: qty 1

## 2010-10-27 MED ORDER — PREDNISONE 10 MG PO TABS: 20.0000 mg | ORAL_TABLET | Freq: Every day | ORAL | Status: AC

## 2010-10-27 MED ORDER — IPRATROPIUM BROMIDE 0.02 % IN SOLN
RESPIRATORY_TRACT | Status: AC
  Administered 2010-10-27: 0.5 mg via RESPIRATORY_TRACT
  Filled 2010-10-27: qty 2.5

## 2010-10-27 MED ORDER — ALBUTEROL (5 MG/ML) CONTINUOUS INHALATION SOLN
10.0000 mg/h | INHALATION_SOLUTION | Freq: Once | RESPIRATORY_TRACT | Status: AC
  Administered 2010-10-27: 10 mg/h via RESPIRATORY_TRACT
  Filled 2010-10-27: qty 20
  Filled 2010-10-27: qty 0.5

## 2010-10-27 MED ORDER — IPRATROPIUM BROMIDE 0.02 % IN SOLN
0.5000 mg | RESPIRATORY_TRACT | Status: DC
  Administered 2010-10-27: 0.5 mg via RESPIRATORY_TRACT

## 2010-10-27 MED ORDER — ALBUTEROL SULFATE HFA 108 (90 BASE) MCG/ACT IN AERS
1.0000 | INHALATION_SPRAY | Freq: Once | RESPIRATORY_TRACT | Status: DC
  Filled 2010-10-27: qty 6.7

## 2010-10-27 MED ORDER — ALBUTEROL SULFATE (5 MG/ML) 0.5% IN NEBU
INHALATION_SOLUTION | RESPIRATORY_TRACT | Status: AC
  Administered 2010-10-27: 5 mg via RESPIRATORY_TRACT
  Filled 2010-10-27: qty 1

## 2010-10-27 MED ORDER — ALBUTEROL SULFATE (5 MG/ML) 0.5% IN NEBU
2.5000 mg | INHALATION_SOLUTION | Freq: Four times a day (QID) | RESPIRATORY_TRACT | Status: DC | PRN
  Administered 2010-10-27: 5 mg via RESPIRATORY_TRACT

## 2010-10-27 MED ORDER — PREDNISONE 50 MG PO TABS
60.0000 mg | ORAL_TABLET | Freq: Once | ORAL | Status: AC
  Administered 2010-10-27: 60 mg via ORAL
  Filled 2010-10-27: qty 1

## 2010-10-27 MED ORDER — PREDNISONE 10 MG PO TABS: 20.0000 mg | ORAL_TABLET | Freq: Every day | ORAL | Status: DC

## 2010-10-27 MED ORDER — HYDROCODONE-ACETAMINOPHEN 5-325 MG PO TABS: 1.0000 | ORAL_TABLET | ORAL | Status: AC | PRN

## 2010-10-27 NOTE — ED Provider Notes (Signed)
History     CSN: [id number] Arrival date & time: 10/27/2010 11:35 AM  Chief Complaint  Patient presents with  . Cough    Pt reports fever and coarse cough with DXN of Pneumonia    (Consider location/radiation/quality/duration/timing/severity/associated sxs/prior treatment) HPI History provided by pt.  Per prior chart, pt diagnosed w/ R middle lobe pneumonia on 10/24/10.  Received IV abx and prescribed zithromax and pain medication.  Pt returns today because her symptoms have worsened despite compliance.  Has productive cough, shortness of breath, wheezing and both left and right lower anterior chest pain, particularly w/ coughing.  Fever returned last night; max temp 101.  No risk factors for PE.  Pt has h/o asthma.   Past Medical History  Diagnosis Date  . Asthma     Past Surgical History  Procedure Date  . Appendectomy   . Cardiac surgery     History reviewed. No pertinent family history.  History  Substance Use Topics  . Smoking status: Current Everyday Smoker -- 1.0 packs/day  . Smokeless tobacco: Not on file  . Alcohol Use: Yes    OB History    Grav Para Term Preterm Abortions TAB SAB Ect Mult Living                  Review of Systems  All other systems reviewed and are negative.    Allergies  Erythromycin and Penicillins  Home Medications   Current Outpatient Rx  Name Route Sig Dispense Refill  . ACETAMINOPHEN 500 MG PO TABS Oral Take 1,000 mg by mouth once as needed. For pain      . ALBUTEROL SULFATE HFA 108 (90 BASE) MCG/ACT IN AERS Inhalation Inhale 2 puffs into the lungs every 6 (six) hours as needed. For shortness of breath     . ASPIRIN 325 MG PO TBEC Oral Take 325 mg by mouth daily. For pain     . AZITHROMYCIN 250 MG PO TABS Oral Take 1 tablet (250 mg total) by mouth daily. Take first 2 tablets together, then 1 every day until finished. 6 tablet 0  . HYDROCODONE-ACETAMINOPHEN 5-325 MG PO TABS Oral Take 2 tablets by mouth every 4 (four) hours as  needed for pain. 20 tablet 0  . IBUPROFEN 200 MG PO TABS Oral Take 800 mg by mouth once as needed. For pain     . OMEPRAZOLE 20 MG PO CPDR Oral Take 1 capsule (20 mg total) by mouth daily.    . SUCRALFATE 1 G PO TABS  One by mouth with meals 3 times a day and at bedtime 40 tablet 0  . VITAMIN C 500 MG PO TABS Oral Take 1,000 mg by mouth daily.        BP 146/90  Pulse 91  Temp(Src) 98.6 F (37 C) (Oral)  Resp 18  SpO2 95%  LMP 10/10/2010  Physical Exam  Nursing note and vitals reviewed. Constitutional: She is oriented to person, place, and time. She appears well-developed and well-nourished.       Uncomfortable appearing  HENT:  Head: Normocephalic and atraumatic.  Right Ear: External ear normal.  Left Ear: External ear normal.  Mouth/Throat: No oropharyngeal exudate.  Eyes:       Normal appearance  Neck: Normal range of motion. Neck supple.  Cardiovascular: Normal rate and regular rhythm.   Pulmonary/Chest: Effort normal. No respiratory distress.       Exp wheezing/rhonchi.  Productive cough.  Mild tenderness of right and left lower ant chest.  Abdominal: Soft. Bowel sounds are normal. She exhibits no distension. There is no tenderness. There is no guarding.  Musculoskeletal: She exhibits no edema and no tenderness.       2+ Dorsalis Pedis pulses  Lymphadenopathy:    She has no cervical adenopathy.  Neurological: She is alert and oriented to person, place, and time.  Skin: Skin is warm and dry. No rash noted. She is not diaphoretic.  Psychiatric: She has a normal mood and affect. Her behavior is normal.    ED Course  Procedures (including critical care time)  Labs Reviewed - No data to display Dg Chest 2 View  10/27/2010  *RADIOLOGY REPORT*  Clinical Data: Pneumonia, chest pain, fever, cough  CHEST - 2 VIEW  Comparison: 10/24/2010  Findings: Residual consolidation in the right middle lobe persists, slight interval improvement.  Normal heart size and vascularity. Central  bronchial thickening evident with normal lung volumes. Negative for edema, effusion, or pneumothorax.  Trachea is midline.  IMPRESSION: Minimal improvement in right middle lobe consolidative pneumonia.  Chronic central bronchial thickening consistent with history of reactive airways disease or asthma.  Original Report Authenticated By: Judie Petit. Ruel Favors, M.D.     1. Pneumonia   2. Asthma attack       MDM  Pt diagnosed w/ pneumonia 3 days ago.  Symptoms have worsened.  Afebrile, no resp distress, prod cough, exp wheezing/rhonchi.  Has received one breathing tmnt w/ little relief.  Will try an hour long continuous neb as well as po prednisone and vicodin.  Repeat CXR pending.  1:04 PM   Pt reports that symptoms have improved.  On re-examination, coughing less and breath sounds have coarsened.  Moving air well . VSS.  Pt received an albuterol inhaler and discharged home w/ prednisone and vicodin for chest pain.  Advised her to return if she develops dyspnea.  2:35 PM        Otilio Miu, [place] 10/27/10 1437

## 2010-10-27 NOTE — ED Notes (Signed)
Pt reports coarse cough with generalized chest pain and fever states "Z pack is Not working" persitant cough upon arrival

## 2010-11-03 NOTE — ED Provider Notes (Signed)
Medical screening examination/treatment/procedure(s) were conducted as a shared visit with non-physician practitioner(s) and myself.  I personally evaluated the patient during the encounter  Toy Baker, MD 11/03/10 [phone]

## 2010-11-08 NOTE — ED Provider Notes (Signed)
Evaluation and management procedures were performed by the PA/NP under my supervision/collaboration.    Felisa Bonier, MD 11/08/10 [phone]

## 2011-01-05 ENCOUNTER — Emergency Department (HOSPITAL_BASED_OUTPATIENT_CLINIC_OR_DEPARTMENT_OTHER)
Admission: EM | Admit: 2011-01-05 | Discharge: 2011-01-05 | Disposition: A | Discharge status: Home / Self Care | Payer: Self-pay | Attending: Emergency Medicine | Admitting: Emergency Medicine

## 2011-01-05 ENCOUNTER — Encounter (HOSPITAL_BASED_OUTPATIENT_CLINIC_OR_DEPARTMENT_OTHER): Payer: Self-pay | Admitting: Emergency Medicine

## 2011-01-05 ENCOUNTER — Emergency Department (INDEPENDENT_AMBULATORY_CARE_PROVIDER_SITE_OTHER): Payer: Self-pay

## 2011-01-05 DIAGNOSIS — R05 Cough: Secondary | ICD-10-CM

## 2011-01-05 DIAGNOSIS — J189 Pneumonia, unspecified organism: Secondary | ICD-10-CM | POA: Insufficient documentation

## 2011-01-05 DIAGNOSIS — F17200 Nicotine dependence, unspecified, uncomplicated: Secondary | ICD-10-CM | POA: Insufficient documentation

## 2011-01-05 DIAGNOSIS — J45909 Unspecified asthma, uncomplicated: Secondary | ICD-10-CM | POA: Insufficient documentation

## 2011-01-05 DIAGNOSIS — R059 Cough, unspecified: Secondary | ICD-10-CM | POA: Insufficient documentation

## 2011-01-05 DIAGNOSIS — R0602 Shortness of breath: Secondary | ICD-10-CM | POA: Insufficient documentation

## 2011-01-05 DIAGNOSIS — R079 Chest pain, unspecified: Secondary | ICD-10-CM

## 2011-01-05 MED ORDER — AZITHROMYCIN 250 MG PO TABS: 250.0000 mg | ORAL_TABLET | Freq: Every day | ORAL | Status: AC

## 2011-01-05 MED ORDER — AZITHROMYCIN 250 MG PO TABS
500.0000 mg | ORAL_TABLET | Freq: Once | ORAL | Status: AC
  Administered 2011-01-05: 500 mg via ORAL
  Filled 2011-01-05: qty 2

## 2011-01-05 MED ORDER — PREDNISONE 20 MG PO TABS: ORAL_TABLET | ORAL | Status: AC

## 2011-01-05 MED ORDER — ALBUTEROL SULFATE (5 MG/ML) 0.5% IN NEBU
5.0000 mg | INHALATION_SOLUTION | Freq: Once | RESPIRATORY_TRACT | Status: AC
  Administered 2011-01-05: 5 mg via RESPIRATORY_TRACT
  Filled 2011-01-05: qty 1

## 2011-01-05 MED ORDER — PREDNISONE 50 MG PO TABS
60.0000 mg | ORAL_TABLET | Freq: Once | ORAL | Status: AC
  Administered 2011-01-05: 60 mg via ORAL
  Filled 2011-01-05: qty 1

## 2011-01-05 MED ORDER — ALBUTEROL SULFATE HFA 108 (90 BASE) MCG/ACT IN AERS
2.0000 | INHALATION_SPRAY | Freq: Once | RESPIRATORY_TRACT | Status: AC
  Administered 2011-01-05: 2 via RESPIRATORY_TRACT
  Filled 2011-01-05: qty 6.7

## 2011-01-05 NOTE — ED Notes (Signed)
Pt  States she had the flu one week ago, but now is not getting better.  Has fever daily, productive cough, yellow sputum and gets sob with exertion.  Now having left sided chest and back pain, Increases with deep breath and coughing.

## 2011-01-05 NOTE — ED Provider Notes (Signed)
History     CSN: [id number] Arrival date & time: 01/05/2011 10:13 AM   First MD Initiated Contact with Patient 01/05/11 1015     10:48 AM HPI Patient reports her father-in-law had the flu last week and then she had similar symptoms shortly after. Her symptoms had improved significantly to within the last 3 days has begun to worsen again. Reports worsening symptoms include cough, left-sided pleuritic chest pain, and feeling short of breath. Reports fevers at night of >100. Reports productive cough. Patient is a 40 y.o. female presenting with flu symptoms. The history is provided by the patient.  Influenza This is a new problem. The current episode started in the past 7 days. The problem occurs constantly. Associated symptoms include chest pain, chills, congestion, coughing, fatigue, a fever, headaches, myalgias and a sore throat. Pertinent negatives include no abdominal pain, nausea, neck pain, numbness, rash, swollen glands, visual change, vomiting or weakness.    Past Medical History  Diagnosis Date  . Asthma     Past Surgical History  Procedure Date  . Appendectomy   . Cardiac surgery     History reviewed. No pertinent family history.  History  Substance Use Topics  . Smoking status: Current Everyday Smoker -- 0.5 packs/day  . Smokeless tobacco: Not on file  . Alcohol Use: Yes     occasional    OB History    Grav Para Term Preterm Abortions TAB SAB Ect Mult Living                  Review of Systems  Constitutional: Positive for fever, chills and fatigue.  HENT: Positive for congestion and sore throat. Negative for rhinorrhea, trouble swallowing, neck pain and neck stiffness.   Respiratory: Positive for cough and shortness of breath.   Cardiovascular: Positive for chest pain.  Gastrointestinal: Negative for nausea, vomiting, abdominal pain and diarrhea.  Genitourinary: Negative for dysuria, hematuria and flank pain.  Musculoskeletal: Positive for myalgias.  Skin:  Negative for rash.  Neurological: Positive for headaches. Negative for dizziness, weakness, light-headedness and numbness.  All other systems reviewed and are negative.    Allergies  Erythromycin; Macrobid; and Penicillins  Home Medications   Current Outpatient Rx  Name Route Sig Dispense Refill  . ACETAMINOPHEN 500 MG PO TABS Oral Take 1,000 mg by mouth once as needed. For pain      . ALBUTEROL SULFATE HFA 108 (90 BASE) MCG/ACT IN AERS Inhalation Inhale 2 puffs into the lungs every 6 (six) hours as needed. For shortness of breath     . IBUPROFEN 200 MG PO TABS Oral Take 800 mg by mouth once as needed. For pain     . OMEPRAZOLE 20 MG PO CPDR Oral Take 1 capsule (20 mg total) by mouth daily.    Marland Kitchen VITAMIN C 500 MG PO TABS Oral Take 1,000 mg by mouth daily.      . ASPIRIN 325 MG PO TBEC Oral Take 325 mg by mouth daily. For pain     . SUCRALFATE 1 G PO TABS  One by mouth with meals 3 times a day and at bedtime 40 tablet 0    BP 122/88  Pulse 85  Temp(Src) 98 F (36.7 C) (Oral)  Resp 16  SpO2 99%  LMP 12/22/2010  Physical Exam  Constitutional: She is oriented to person, place, and time. Vital signs are normal. She appears well-developed and well-nourished.  HENT:  Head: Normocephalic and atraumatic.  Right Ear: Tympanic membrane, external ear  and ear canal normal.  Left Ear: Tympanic membrane, external ear and ear canal normal.  Nose: Nose normal. No mucosal edema or rhinorrhea. Right sinus exhibits no maxillary sinus tenderness and no frontal sinus tenderness. Left sinus exhibits no maxillary sinus tenderness and no frontal sinus tenderness.  Mouth/Throat: Uvula is midline, oropharynx is clear and moist and mucous membranes are normal.  Eyes: Conjunctivae are normal. Pupils are equal, round, and reactive to light.  Neck: Normal range of motion. Neck supple.  Cardiovascular: Normal rate, regular rhythm and normal heart sounds.  Exam reveals no friction rub.   No murmur  heard. Pulmonary/Chest: Effort normal. She has no decreased breath sounds. She has wheezes in the right lower field and the left lower field. She has rhonchi in the right upper field, the right middle field, the right lower field, the left upper field, the left middle field and the left lower field. She has no rales. She exhibits no tenderness.  Musculoskeletal: Normal range of motion.  Neurological: She is alert and oriented to person, place, and time. Coordination normal.  Skin: Skin is warm and dry. No rash noted. No erythema. No pallor.    ED Course  Procedures Dg Chest 2 View  01/05/2011  *RADIOLOGY REPORT*  Clinical Data: Cough.  Chest pain.  CHEST - 2 VIEW  Comparison: 10/27/2010  Findings: Heart size is normal.  No pleural effusions identified.  Previously noted airspace consolidation within the right middle lobe appears improved from previous exam.  There are worsening bilateral mid lung opacities.  Coarsened interstitial markings are noted bilaterally.  IMPRESSION:  1.  Improved aeration the right middle lobe. 2.  Worsening bilateral mid lung opacities suspicious for persistent multifocal infection.  Original Report Authenticated By: Rosealee Albee, M.D.      MDM   Patient appears to have bilateral lung opacities. Will treat as a community-acquired pneumonia. Since it appears to be worsened since prior x-ray in October but the symptoms are new I have strongly advised patient to followup with her primary care physician for recheck in one week after taking medication. Patient understands and requests primary care physician referrals. Will give first dose of antibiotics and steroids in the emergency department. We'll also give albuterol inhaler. Patient agrees with plan and is ready for discharge      Thomasene Lot, [place] 01/05/11 1203

## 2011-01-05 NOTE — ED Provider Notes (Signed)
Medical screening examination/treatment/procedure(s) were performed by non-physician practitioner and as supervising physician I was immediately available for consultation/collaboration.  Christion Leonhard, MD 01/05/11 1636 

## 2011-01-08 IMAGING — CR DG SHOULDER 2+V*L*
3 series · 3 of 3 positions shown · non-contrast
Comparison: 09/09/2004.

CLINICAL DATA: The left shoulder pain.

LEFT SHOULDER - 2+ VIEW

[w shoulder ap internal left]
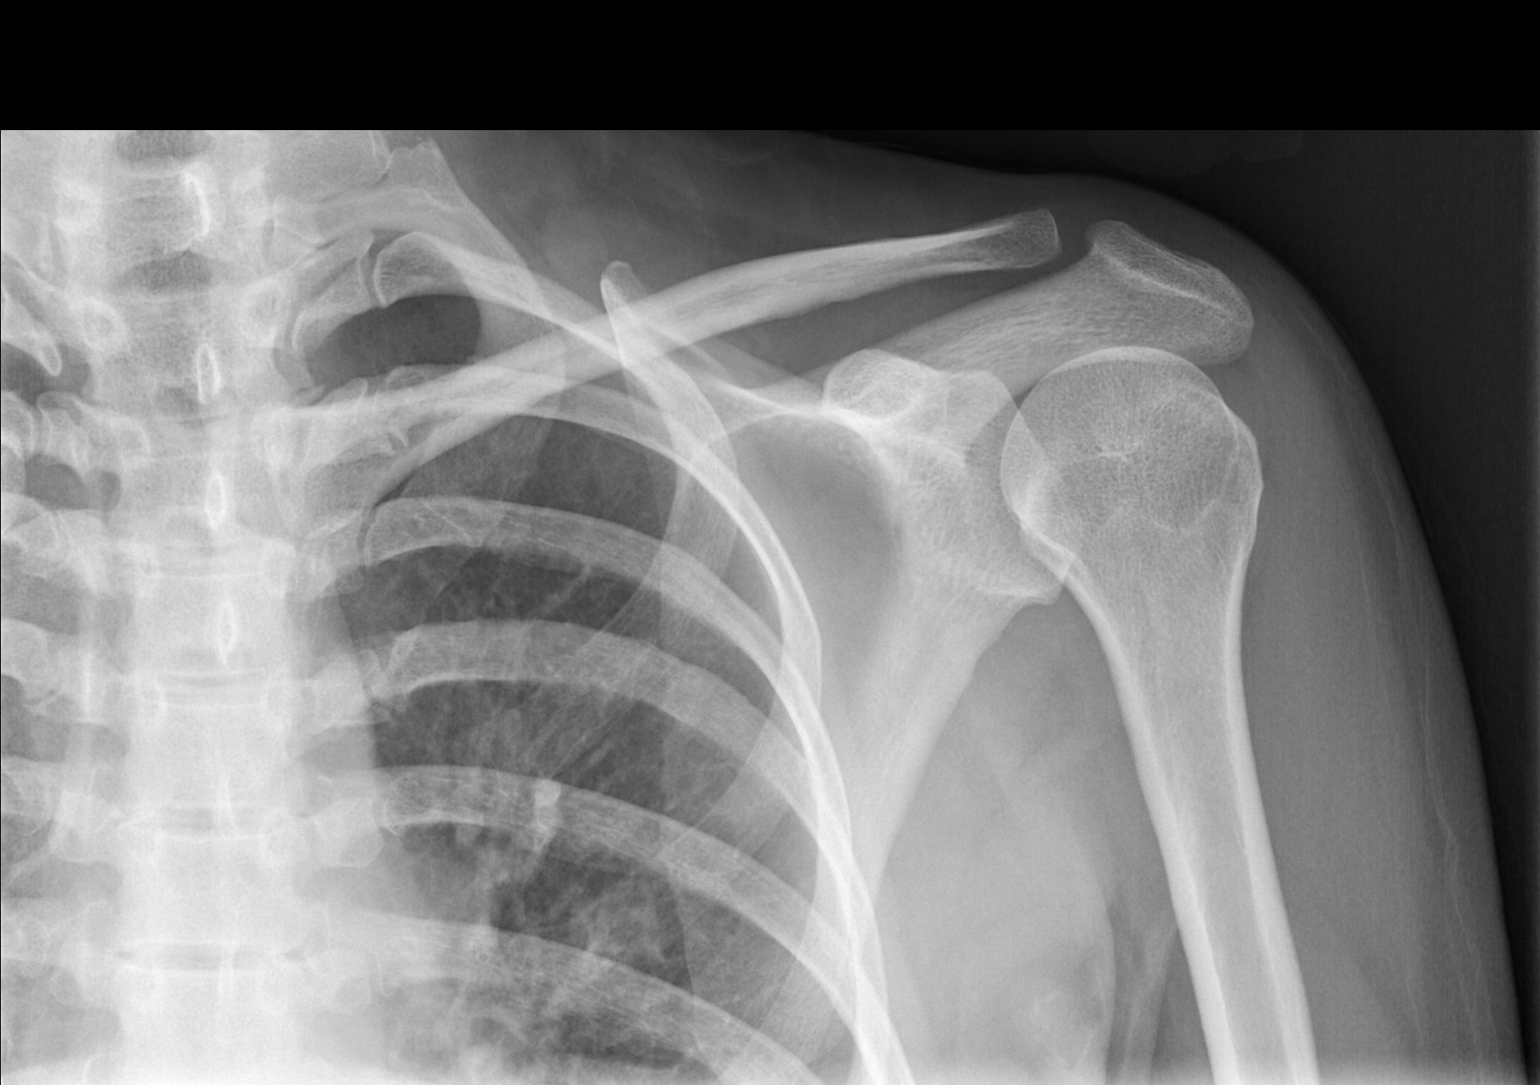

[w shoulder ap external left]
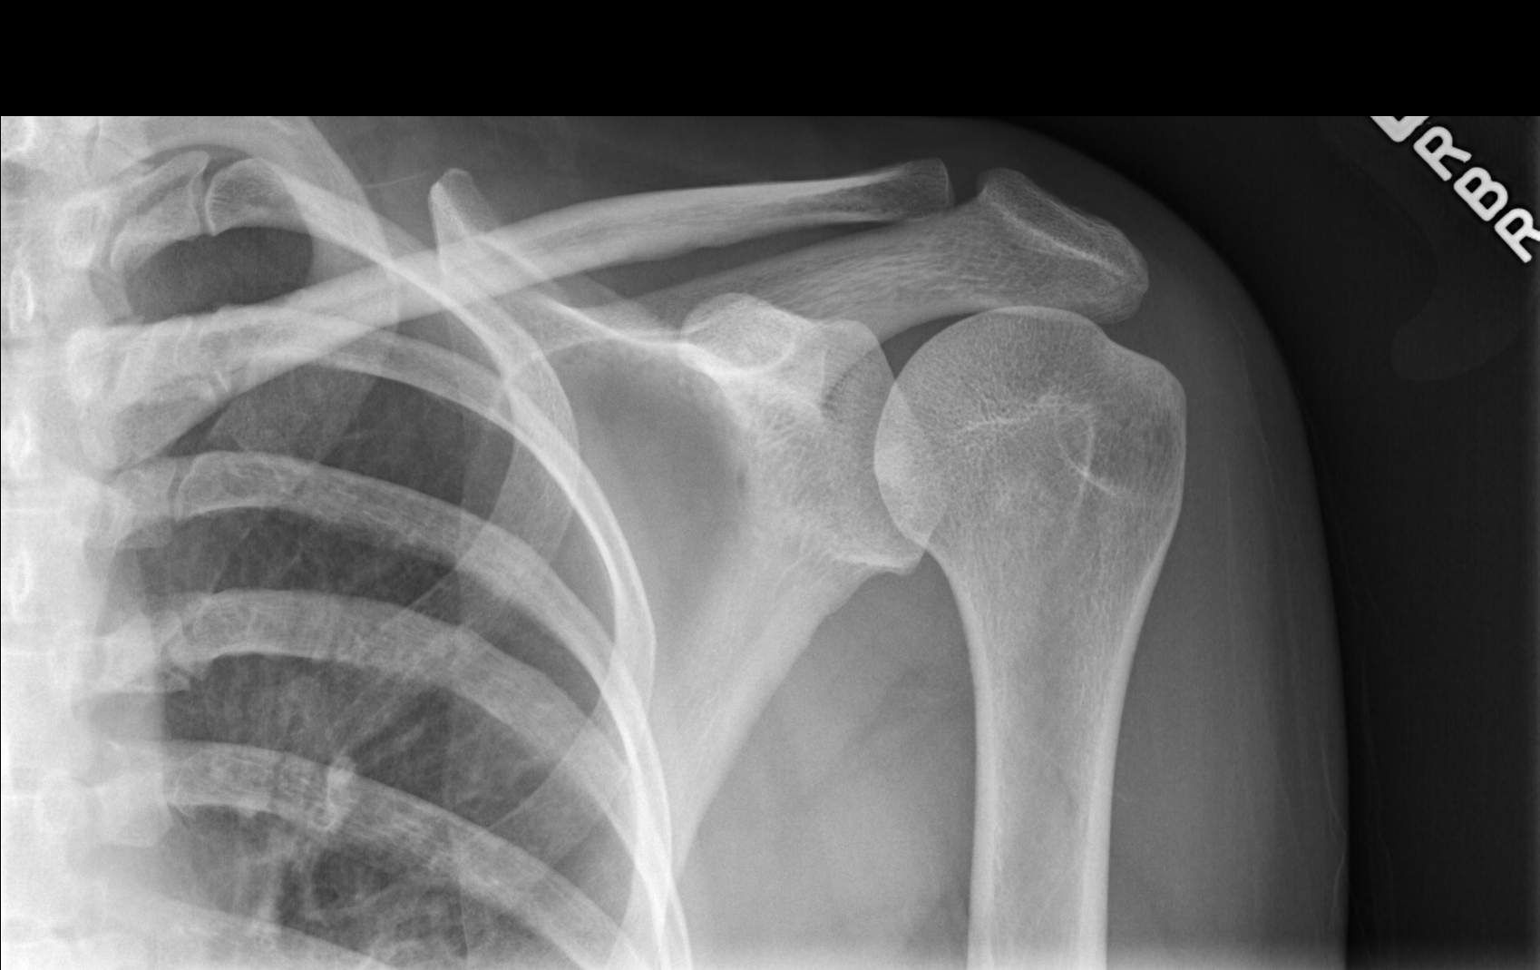

[x shoulder axillary left]
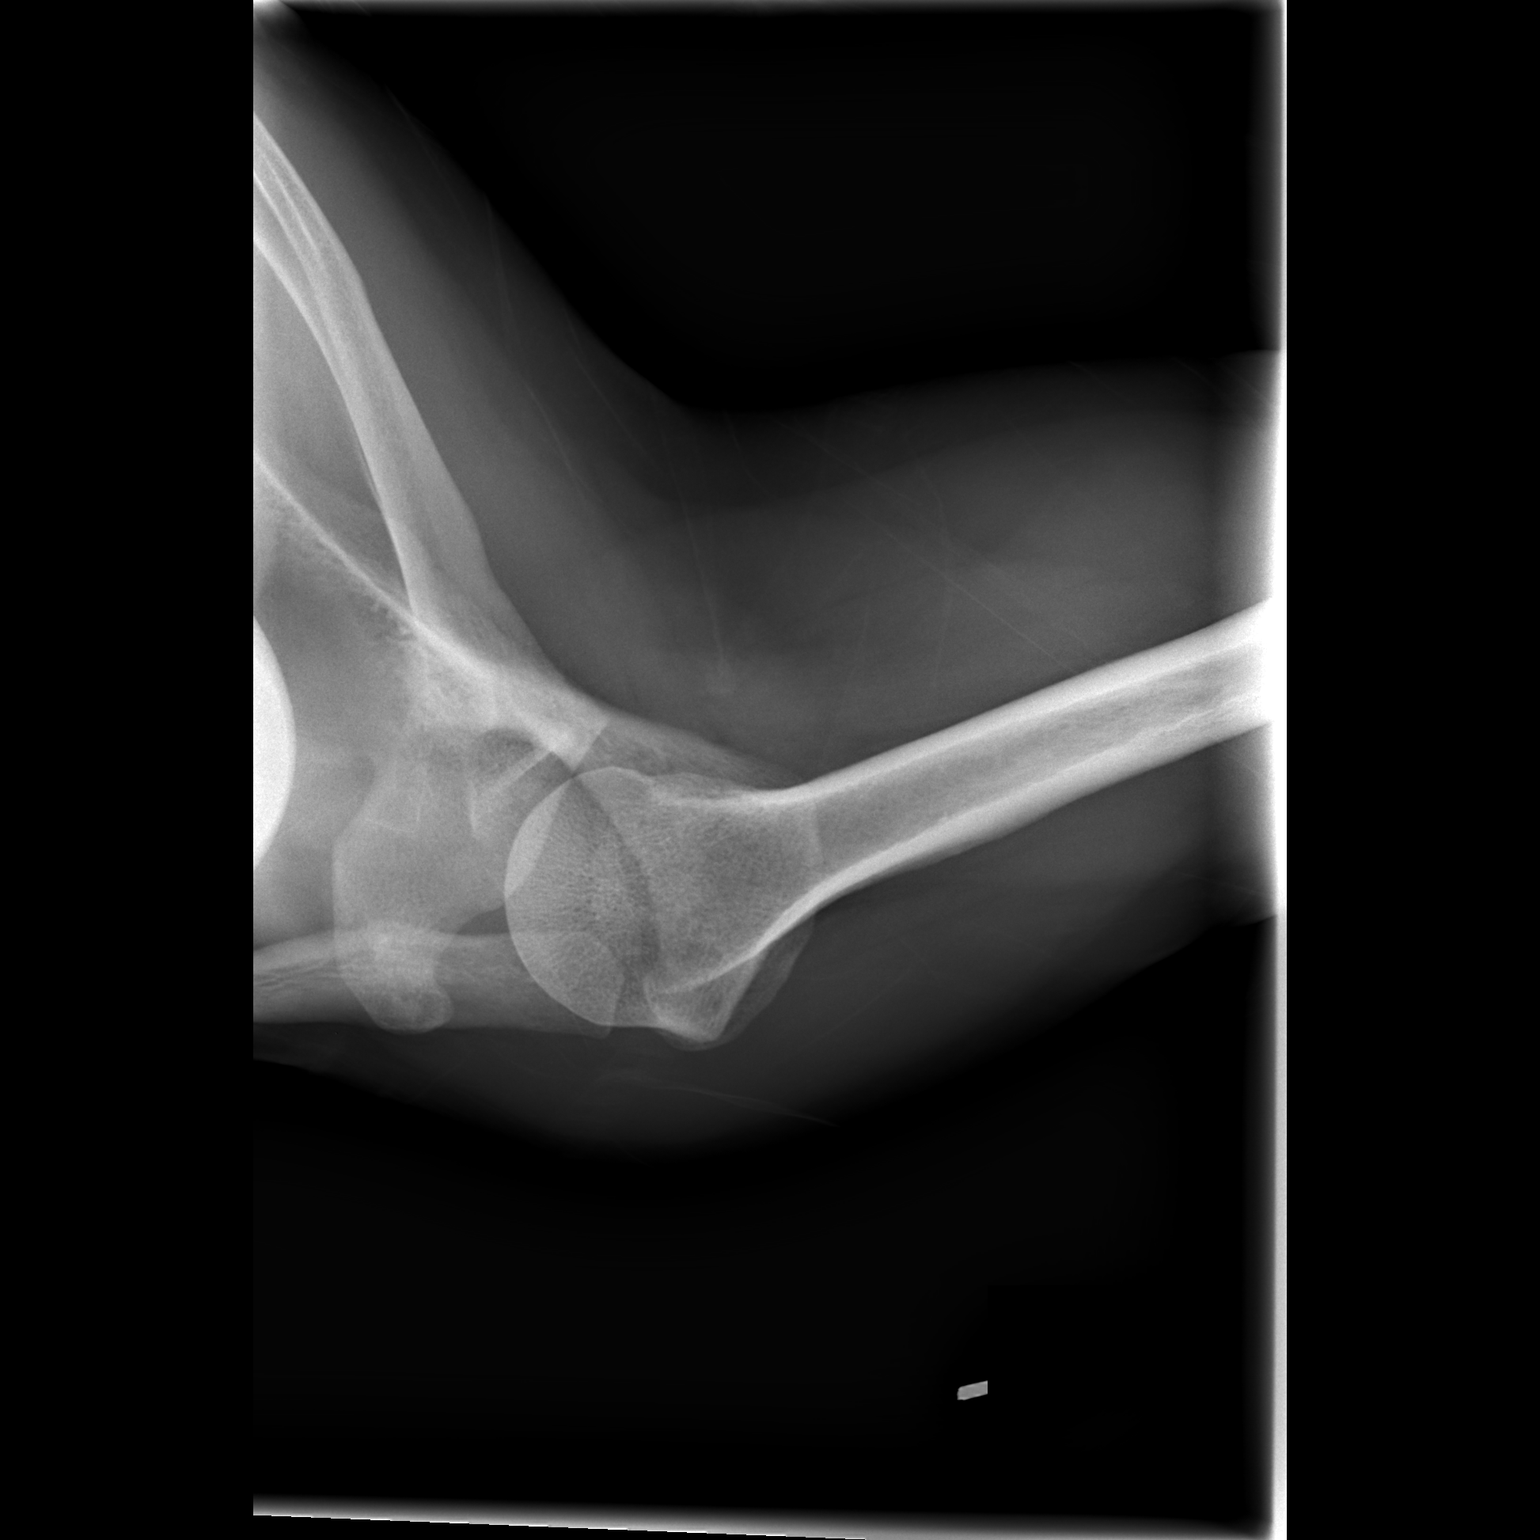

[3 of 3 positions shown; findings below may reference images not displayed]

FINDINGS: No fracture or dislocation.  No foreign body or other
abnormality of the soft tissues.
IMPRESSION: No acute or significant findings.

## 2011-01-08 IMAGING — CR DG CERVICAL SPINE COMPLETE 4+V
5 series · 5 of 5 positions shown · non-contrast
Comparison: Report of a prior study 08/07/2002.

CLINICAL DATA: The left arm pain with numbness

CERVICAL SPINE - COMPLETE 4+ VIEW

[w c-spine lat]
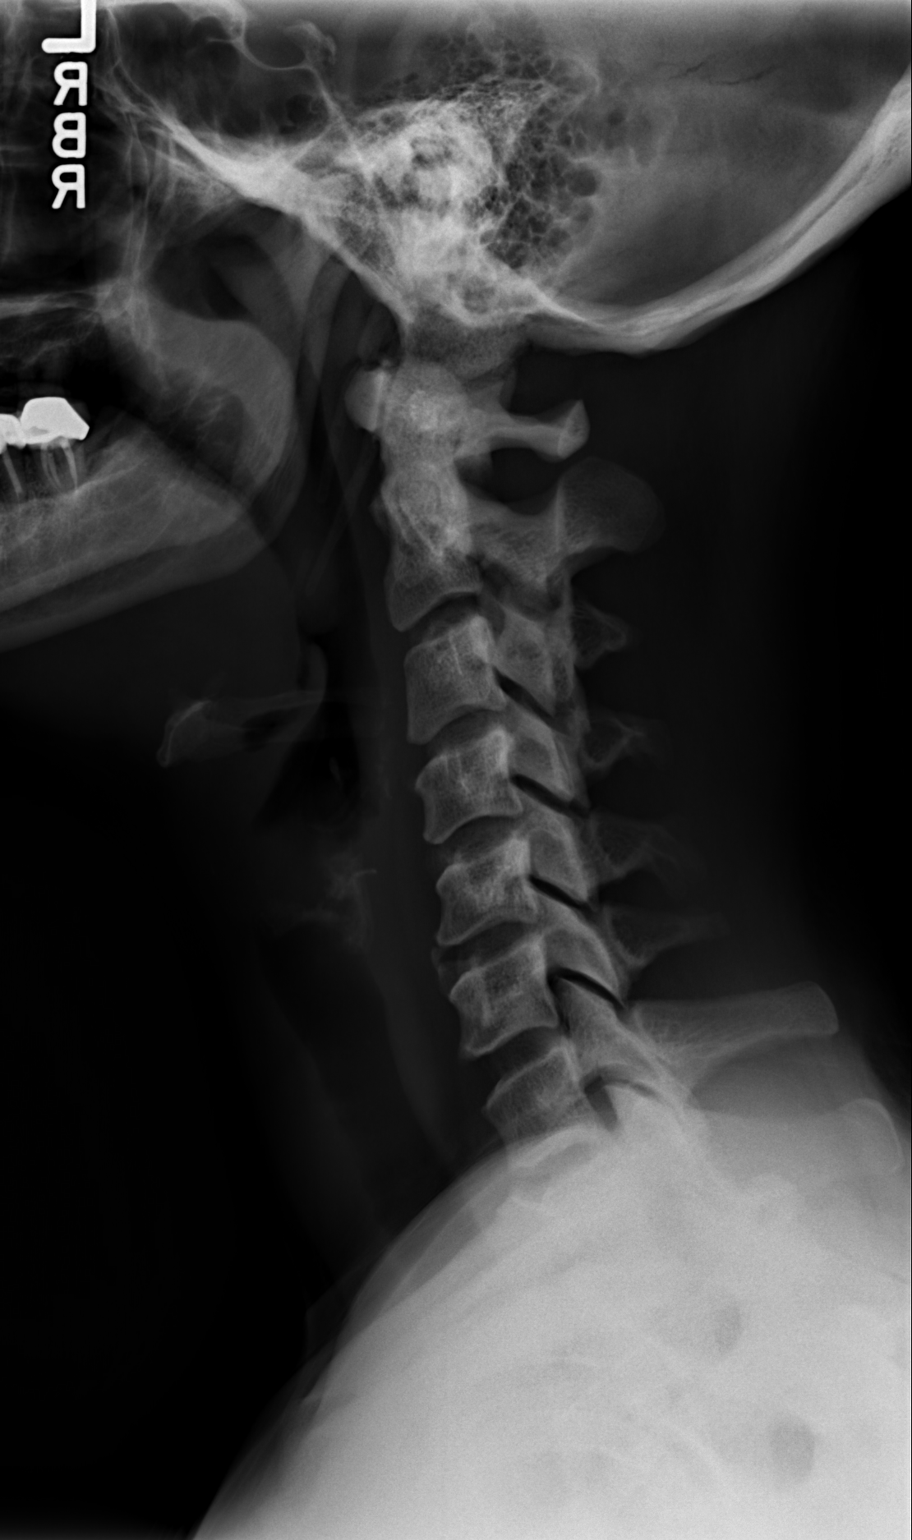

[w c-spine oblique (1 of 2)]
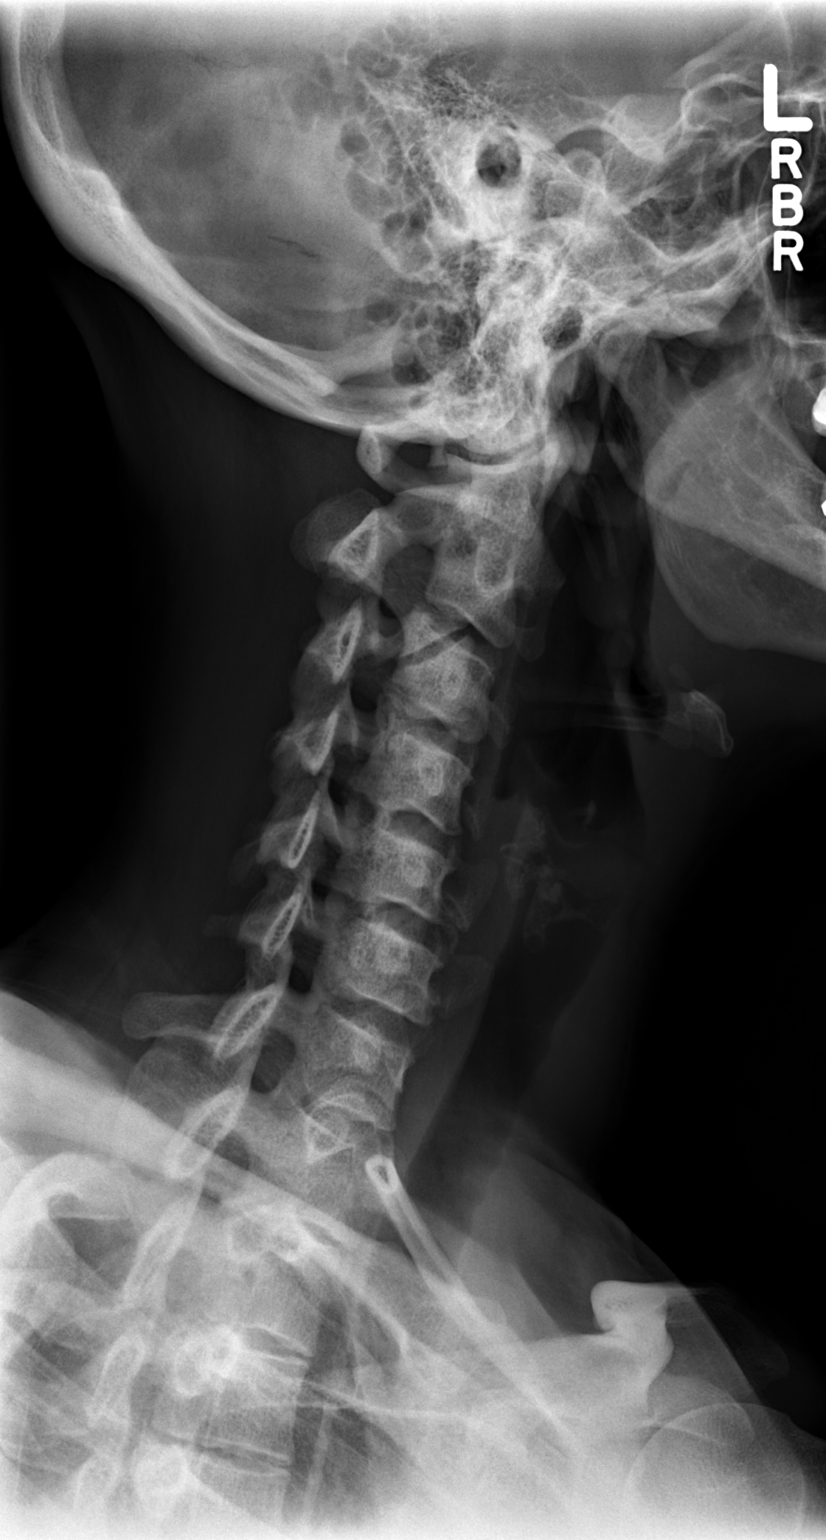

[w c-spine oblique (2 of 2)]
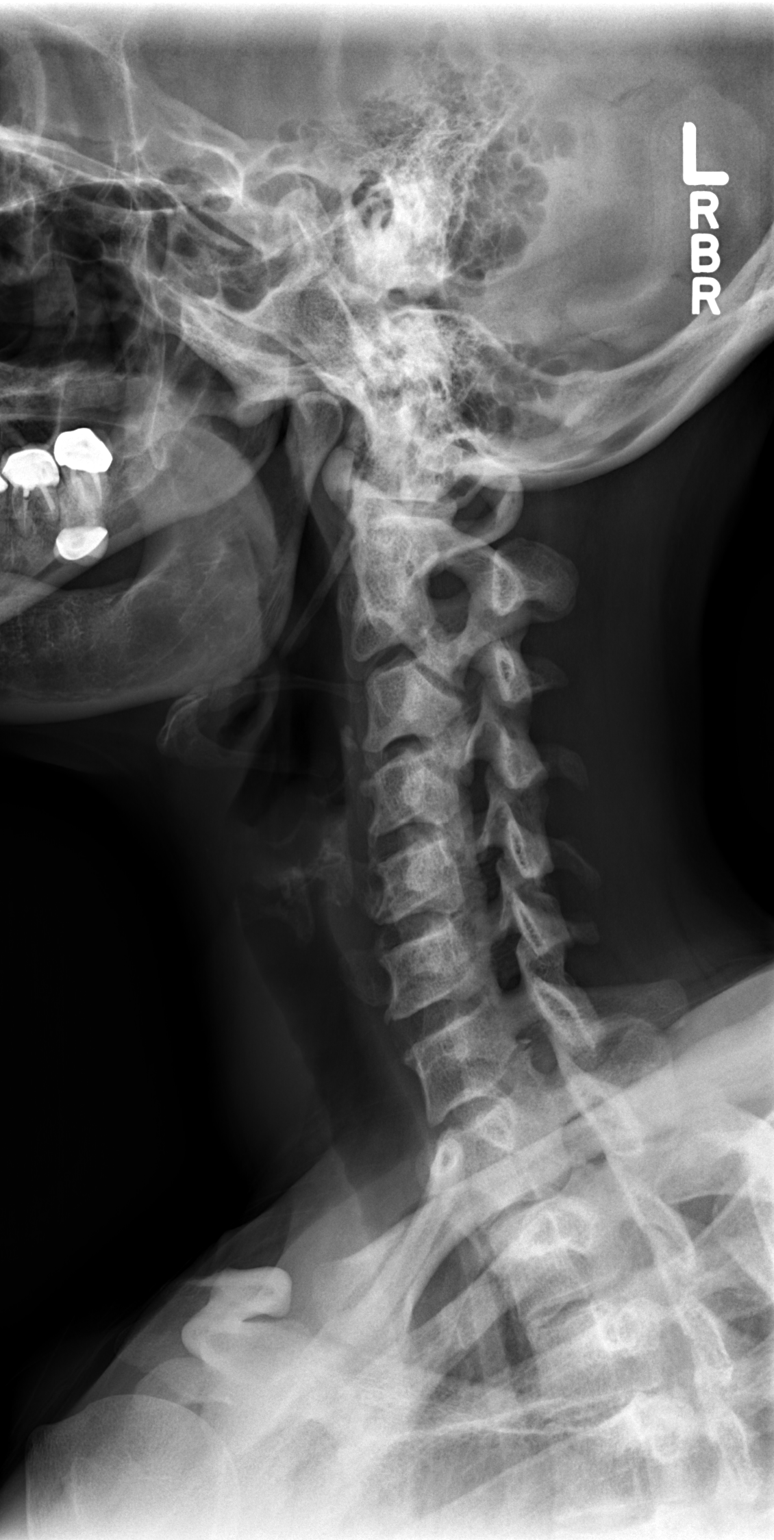

[w c-spine a.p.]
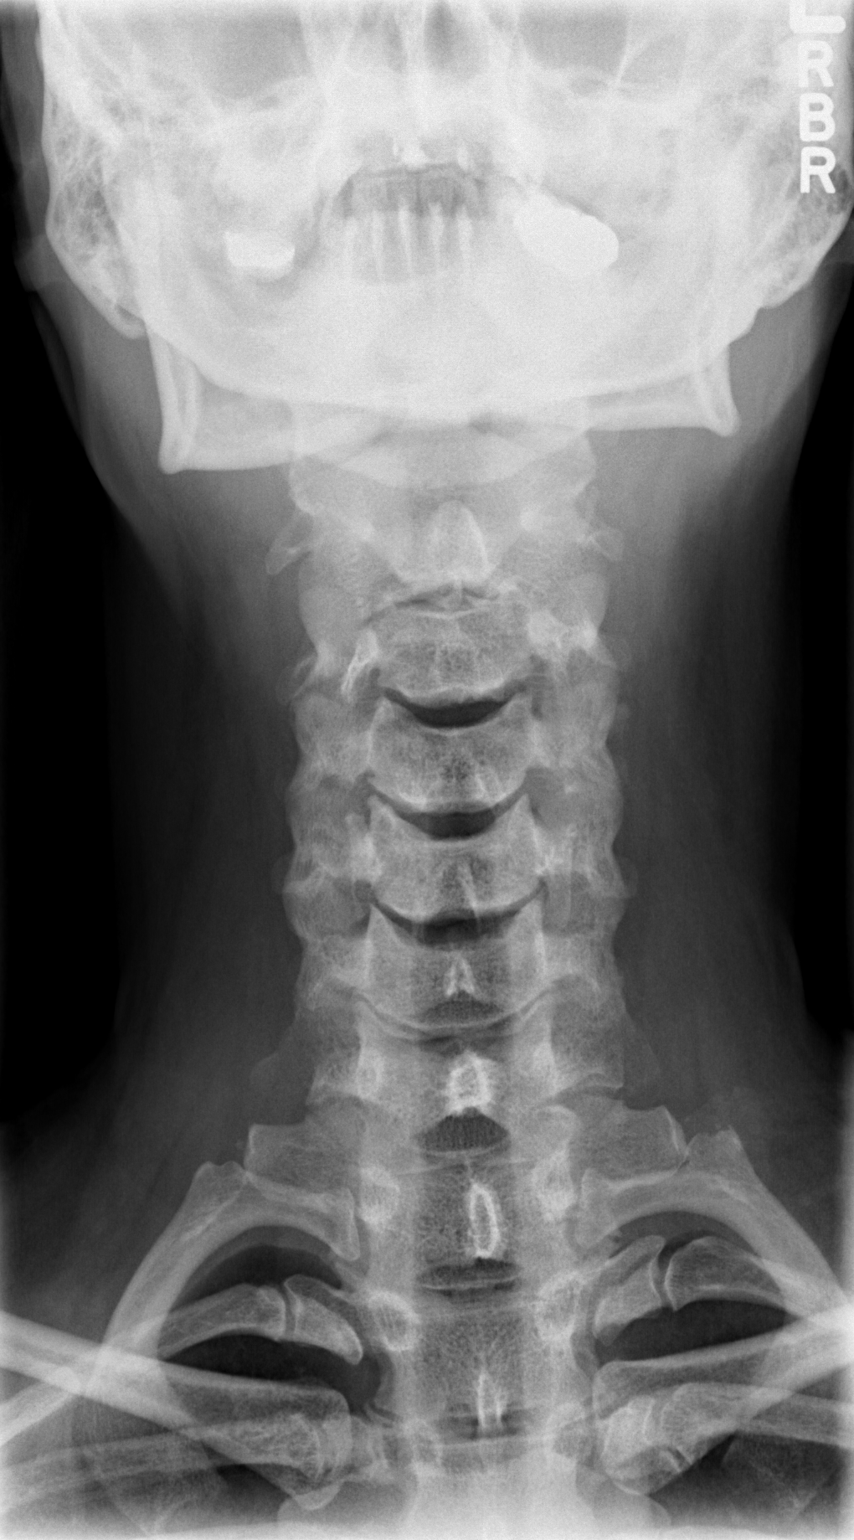

[w c-spine odontoid]
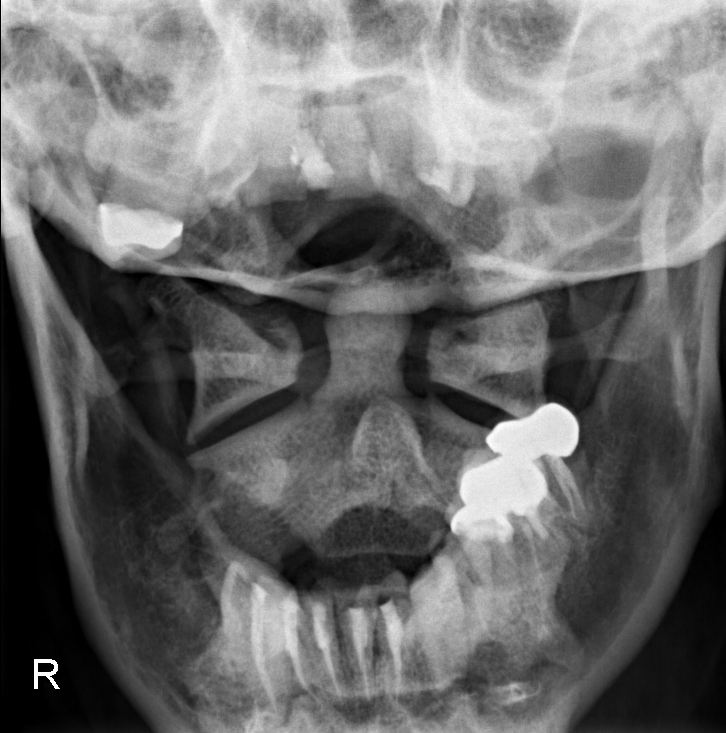

[5 of 5 positions shown; findings below may reference images not displayed]

FINDINGS: There is loss of the normal cervical lordotic curve.
Disc height preserved.  No fracture or subluxation.  No foraminal
stenosis.  Soft tissues normal.
IMPRESSION: Normal except for loss of the normal cervical lordotic curve.

## 2011-02-12 ENCOUNTER — Encounter (HOSPITAL_COMMUNITY): Payer: Self-pay | Admitting: *Deleted

## 2011-02-12 ENCOUNTER — Emergency Department (INDEPENDENT_AMBULATORY_CARE_PROVIDER_SITE_OTHER)
Admission: EM | Admit: 2011-02-12 | Discharge: 2011-02-12 | Disposition: A | Discharge status: Home / Self Care | Payer: Self-pay | Source: Home / Self Care | Attending: Emergency Medicine | Admitting: Emergency Medicine

## 2011-02-12 DIAGNOSIS — J029 Acute pharyngitis, unspecified: Secondary | ICD-10-CM

## 2011-02-12 DIAGNOSIS — R490 Dysphonia: Secondary | ICD-10-CM

## 2011-02-12 DIAGNOSIS — F17200 Nicotine dependence, unspecified, uncomplicated: Secondary | ICD-10-CM

## 2011-02-12 MED ORDER — PREDNISONE 20 MG PO TABS: 40.0000 mg | ORAL_TABLET | Freq: Every day | ORAL | Status: AC

## 2011-02-12 NOTE — ED Notes (Signed)
Pt  Is  A  Smoker     She  Has  A  Raspy  Voice    -  She  Reports  Symptoms  Of  Being  Hoarse   And  sorethroat  With a  persistant  Cough  As  Well  -  X   2  Months  Getting  Worse     She  Reports  Recently  Has  Problems  Swallowing

## 2011-02-12 NOTE — ED Provider Notes (Addendum)
History     CSN: [id number]  Arrival date & time 02/12/11  1225   First MD Initiated Contact with Patient 02/12/11 1248      Chief Complaint  Patient presents with  . Cough    (Consider location/radiation/quality/duration/timing/severity/associated sxs/prior treatment) HPI Comments: Have had this raspy voice for almost 3 months now, my throat always hurt" also with a cough sometimes with phlegm sometimes its dry, Im here cause of my voice, i have been smoking since im 41 years old"  Patient is a 41 y.o. female presenting with cough and pharyngitis. The history is provided by the patient.  Cough This is a chronic (more than 3 months) problem. The problem occurs constantly. The problem has been gradually worsening. The cough is non-productive. Associated symptoms include sore throat. Pertinent negatives include no chest pain, no chills, no sweats, no weight loss, no headaches, no shortness of breath and no wheezing. The treatment provided no relief. She is a smoker.  Sore Throat This is a chronic problem. The problem occurs constantly. Pertinent negatives include no chest pain, no headaches and no shortness of breath. The symptoms are aggravated by swallowing. The symptoms are relieved by nothing. She has tried nothing for the symptoms. The treatment provided no relief.    Past Medical History  Diagnosis Date  . Asthma     Past Surgical History  Procedure Date  . Appendectomy   . Cardiac surgery     History reviewed. No pertinent family history.  History  Substance Use Topics  . Smoking status: Current Everyday Smoker -- 0.5 packs/day  . Smokeless tobacco: Not on file  . Alcohol Use: Yes     occasional    OB History    Grav Para Term Preterm Abortions TAB SAB Ect Mult Living                  Review of Systems  Constitutional: Negative for fever, chills and weight loss.  HENT: Positive for sore throat, dental problem and voice change. Negative for drooling and mouth  sores.   Respiratory: Positive for cough. Negative for shortness of breath and wheezing.   Cardiovascular: Negative for chest pain.  Neurological: Negative for headaches.    Allergies  Erythromycin; Macrobid; and Penicillins  Home Medications   Current Outpatient Rx  Name Route Sig Dispense Refill  . ACETAMINOPHEN 500 MG PO TABS Oral Take 1,000 mg by mouth once as needed. For pain      . ALBUTEROL SULFATE HFA 108 (90 BASE) MCG/ACT IN AERS Inhalation Inhale 2 puffs into the lungs every 6 (six) hours as needed. For shortness of breath     . ASPIRIN 325 MG PO TBEC Oral Take 325 mg by mouth daily. For pain     . IBUPROFEN 200 MG PO TABS Oral Take 800 mg by mouth once as needed. For pain     . OMEPRAZOLE 20 MG PO CPDR Oral Take 1 capsule (20 mg total) by mouth daily.    Marland Kitchen PREDNISONE 20 MG PO TABS Oral Take 2 tablets (40 mg total) by mouth daily. 2 tablets daily for 5 days 10 tablet 0  . SUCRALFATE 1 G PO TABS  One by mouth with meals 3 times a day and at bedtime 40 tablet 0  . VITAMIN C 500 MG PO TABS Oral Take 1,000 mg by mouth daily.        BP 133/84  Pulse 81  Temp(Src) 98.4 F (36.9 C) (Oral)  Resp 20  SpO2 100%  Physical Exam  Nursing note and vitals reviewed. Constitutional: She appears well-developed and well-nourished. No distress.  HENT:  Head: Normocephalic.  Mouth/Throat: Uvula is midline and mucous membranes are normal. Posterior oropharyngeal erythema present. No oropharyngeal exudate, posterior oropharyngeal edema or tonsillar abscesses.    Neck: Trachea normal and normal range of motion. No JVD present. No tracheal deviation present. No thyromegaly present.  Lymphadenopathy:    She has no cervical adenopathy.  Skin: She is not diaphoretic.    ED Course  Procedures (including critical care time)  Labs Reviewed - No data to display No results found.   1. Hoarseness, persistent   2. Sore throat   3. Nicotine addiction       MDM  Heavy smoker presents  to [facility] c/o voice change constant, x more than 2 months and sore throat with occupation subjective sensation, explained tp patient imperative need to rule out a laryngeal pharyngeal cancer, understood need to see the ENT DOCTOR for further evaluation.        Jimmie Molly, MD 02/12/11 1953  Jimmie Molly, MD 02/12/11 2024

## 2011-02-13 ENCOUNTER — Telehealth (HOSPITAL_COMMUNITY): Payer: Self-pay | Admitting: *Deleted

## 2011-02-25 ENCOUNTER — Other Ambulatory Visit: Payer: Self-pay | Admitting: Otolaryngology

## 2011-02-25 DIAGNOSIS — E049 Nontoxic goiter, unspecified: Secondary | ICD-10-CM

## 2011-03-20 ENCOUNTER — Ambulatory Visit
Admission: RE | Admit: 2011-03-20 | Discharge: 2011-03-20 | Disposition: A | Discharge status: Home / Self Care | Payer: No Typology Code available for payment source | Source: Ambulatory Visit | Attending: Otolaryngology | Admitting: Otolaryngology

## 2011-03-20 DIAGNOSIS — E049 Nontoxic goiter, unspecified: Secondary | ICD-10-CM

## 2011-04-01 ENCOUNTER — Telehealth: Payer: Self-pay

## 2011-04-01 NOTE — Telephone Encounter (Signed)
Opened on accident

## 2011-10-28 ENCOUNTER — Ambulatory Visit (HOSPITAL_BASED_OUTPATIENT_CLINIC_OR_DEPARTMENT_OTHER): Admit: 2011-10-28 | Payer: Self-pay | Admitting: Orthopedic Surgery

## 2011-10-28 ENCOUNTER — Encounter (HOSPITAL_BASED_OUTPATIENT_CLINIC_OR_DEPARTMENT_OTHER): Payer: Self-pay | Admitting: Certified Registered"

## 2011-10-28 ENCOUNTER — Encounter (HOSPITAL_BASED_OUTPATIENT_CLINIC_OR_DEPARTMENT_OTHER)
Admission: EM | Disposition: A | Discharge status: Home / Self Care | Payer: Self-pay | Source: Home / Self Care | Attending: Emergency Medicine

## 2011-10-28 ENCOUNTER — Encounter (HOSPITAL_BASED_OUTPATIENT_CLINIC_OR_DEPARTMENT_OTHER): Payer: Self-pay | Admitting: Orthopedic Surgery

## 2011-10-28 ENCOUNTER — Encounter (HOSPITAL_BASED_OUTPATIENT_CLINIC_OR_DEPARTMENT_OTHER)
Admission: RE | Disposition: A | Discharge status: Home / Self Care | Payer: Self-pay | Source: Ambulatory Visit | Attending: Orthopedic Surgery

## 2011-10-28 ENCOUNTER — Ambulatory Visit (HOSPITAL_BASED_OUTPATIENT_CLINIC_OR_DEPARTMENT_OTHER)
Admission: RE | Admit: 2011-10-28 | Discharge: 2011-10-28 | Disposition: A | Discharge status: Home / Self Care | Payer: Self-pay | Source: Ambulatory Visit | Attending: Orthopedic Surgery | Admitting: Orthopedic Surgery

## 2011-10-28 ENCOUNTER — Other Ambulatory Visit: Payer: Self-pay | Admitting: Orthopedic Surgery

## 2011-10-28 ENCOUNTER — Ambulatory Visit (HOSPITAL_BASED_OUTPATIENT_CLINIC_OR_DEPARTMENT_OTHER): Payer: Self-pay | Admitting: Certified Registered"

## 2011-10-28 ENCOUNTER — Emergency Department (HOSPITAL_BASED_OUTPATIENT_CLINIC_OR_DEPARTMENT_OTHER)
Admission: EM | Admit: 2011-10-28 | Discharge: 2011-10-28 | Disposition: A | Discharge status: Home / Self Care | Payer: Self-pay | Attending: Emergency Medicine | Admitting: Emergency Medicine

## 2011-10-28 ENCOUNTER — Encounter (HOSPITAL_BASED_OUTPATIENT_CLINIC_OR_DEPARTMENT_OTHER): Payer: Self-pay | Admitting: *Deleted

## 2011-10-28 ENCOUNTER — Emergency Department (HOSPITAL_BASED_OUTPATIENT_CLINIC_OR_DEPARTMENT_OTHER): Payer: Self-pay

## 2011-10-28 DIAGNOSIS — F17200 Nicotine dependence, unspecified, uncomplicated: Secondary | ICD-10-CM | POA: Insufficient documentation

## 2011-10-28 DIAGNOSIS — L089 Local infection of the skin and subcutaneous tissue, unspecified: Secondary | ICD-10-CM | POA: Insufficient documentation

## 2011-10-28 DIAGNOSIS — Y9229 Other specified public building as the place of occurrence of the external cause: Secondary | ICD-10-CM | POA: Insufficient documentation

## 2011-10-28 DIAGNOSIS — Y93G1 Activity, food preparation and clean up: Secondary | ICD-10-CM | POA: Insufficient documentation

## 2011-10-28 DIAGNOSIS — J45909 Unspecified asthma, uncomplicated: Secondary | ICD-10-CM | POA: Insufficient documentation

## 2011-10-28 DIAGNOSIS — Y990 Civilian activity done for income or pay: Secondary | ICD-10-CM | POA: Insufficient documentation

## 2011-10-28 HISTORY — PX: I&D EXTREMITY: SHX5045

## 2011-10-28 LAB — CBC WITH DIFFERENTIAL/PLATELET
Eosinophils Absolute: 0.2 10*3/uL (ref 0.0–0.7)
Hemoglobin: 14 g/dL (ref 12.0–15.0)
Lymphs Abs: 2.1 10*3/uL (ref 0.7–4.0)
MCH: 35.3 pg — ABNORMAL HIGH (ref 26.0–34.0)
MCV: 97.5 fL (ref 78.0–100.0)
Monocytes Relative: 6 % (ref 3–12)
Neutrophils Relative %: 74 % (ref 43–77)
RBC: 3.97 MIL/uL (ref 3.87–5.11)

## 2011-10-28 LAB — BASIC METABOLIC PANEL
BUN: 5 mg/dL — ABNORMAL LOW (ref 6–23)
CO2: 22 mEq/L (ref 19–32)
GFR calc non Af Amer: >90 mL/min (ref >90)
Glucose, Bld: 83 mg/dL (ref 70–99)
Potassium: 4.1 mEq/L (ref 3.5–5.1)

## 2011-10-28 SURGERY — IRRIGATION AND DEBRIDEMENT EXTREMITY
Anesthesia: General | Site: Finger | Laterality: Right | Wound class: Dirty or Infected

## 2011-10-28 SURGERY — IRRIGATION AND DEBRIDEMENT EXTREMITY
Anesthesia: Regional | Laterality: Right

## 2011-10-28 MED ORDER — PROPOFOL 10 MG/ML IV BOLUS
INTRAVENOUS | Status: DC | PRN
  Administered 2011-10-28: 200 mg via INTRAVENOUS

## 2011-10-28 MED ORDER — ONDANSETRON HCL 4 MG/2ML IJ SOLN
INTRAMUSCULAR | Status: DC | PRN
  Administered 2011-10-28: 4 mg via INTRAVENOUS

## 2011-10-28 MED ORDER — PROPOFOL 10 MG/ML IV EMUL
INTRAVENOUS | Status: DC | PRN
  Administered 2011-10-28: 50 ug/kg/min via INTRAVENOUS

## 2011-10-28 MED ORDER — BUPIVACAINE HCL (PF) 0.25 % IJ SOLN
INTRAMUSCULAR | Status: DC | PRN
  Administered 2011-10-28: 9 mL

## 2011-10-28 MED ORDER — FENTANYL CITRATE 0.05 MG/ML IJ SOLN
INTRAMUSCULAR | Status: DC | PRN
  Administered 2011-10-28 (×2): 50 ug via INTRAVENOUS

## 2011-10-28 MED ORDER — 0.9 % SODIUM CHLORIDE (POUR BTL) OPTIME
TOPICAL | Status: DC | PRN
  Administered 2011-10-28: 500 mL

## 2011-10-28 MED ORDER — OXYCODONE HCL 5 MG/5ML PO SOLN: 5.0000 mg | Freq: Once | ORAL | Status: DC | PRN

## 2011-10-28 MED ORDER — OXYCODONE-ACETAMINOPHEN 5-325 MG PO TABS: ORAL_TABLET | ORAL | Status: DC

## 2011-10-28 MED ORDER — OXYCODONE HCL 5 MG PO TABS: 5.0000 mg | ORAL_TABLET | Freq: Once | ORAL | Status: DC | PRN

## 2011-10-28 MED ORDER — VANCOMYCIN HCL 1000 MG IV SOLR
1000.0000 mg | INTRAVENOUS | Status: DC | PRN
  Administered 2011-10-28: 1000 mg via INTRAVENOUS

## 2011-10-28 MED ORDER — FENTANYL CITRATE 0.05 MG/ML IJ SOLN
25.0000 ug | INTRAMUSCULAR | Status: DC | PRN
  Administered 2011-10-28: 50 ug via INTRAVENOUS
  Administered 2011-10-28 (×2): 25 ug via INTRAVENOUS

## 2011-10-28 MED ORDER — LACTATED RINGERS IV SOLN
INTRAVENOUS | Status: DC
  Administered 2011-10-28: 10 mL/h via INTRAVENOUS
  Administered 2011-10-28: 14:00:00 via INTRAVENOUS

## 2011-10-28 MED ORDER — DOXYCYCLINE HYCLATE 100 MG PO TABS: 100.0000 mg | ORAL_TABLET | Freq: Two times a day (BID) | ORAL | Status: DC

## 2011-10-28 MED ORDER — PROPOFOL 10 MG/ML IV EMUL: INTRAVENOUS | Status: DC | PRN

## 2011-10-28 MED ORDER — DEXAMETHASONE SODIUM PHOSPHATE 4 MG/ML IJ SOLN
INTRAMUSCULAR | Status: DC | PRN
  Administered 2011-10-28: 10 mg via INTRAVENOUS

## 2011-10-28 MED ORDER — ONDANSETRON HCL 4 MG/2ML IJ SOLN: 4.0000 mg | Freq: Four times a day (QID) | INTRAMUSCULAR | Status: DC | PRN

## 2011-10-28 SURGICAL SUPPLY — 49 items
BAG DECANTER FOR FLEXI CONT (MISCELLANEOUS) IMPLANT
BANDAGE ELASTIC 3 VELCRO ST LF (GAUZE/BANDAGES/DRESSINGS) IMPLANT
BANDAGE GAUZE ELAST BULKY 4 IN (GAUZE/BANDAGES/DRESSINGS) IMPLANT
BANDAGE GAUZE STRT 1 STR LF (GAUZE/BANDAGES/DRESSINGS) IMPLANT
BLADE MINI RND TIP GREEN BEAV (BLADE) IMPLANT
BLADE SURG 15 STRL LF DISP TIS (BLADE) ×2 IMPLANT
BLADE SURG 15 STRL SS (BLADE) ×2
BNDG COHESIVE 1X5 TAN STRL LF (GAUZE/BANDAGES/DRESSINGS) ×2 IMPLANT
BNDG ELASTIC 2 VLCR STRL LF (GAUZE/BANDAGES/DRESSINGS) IMPLANT
BNDG ESMARK 4X9 LF (GAUZE/BANDAGES/DRESSINGS) IMPLANT
CHLORAPREP W/TINT 26ML (MISCELLANEOUS) ×2 IMPLANT
CLOTH BEACON ORANGE TIMEOUT ST (SAFETY) ×2 IMPLANT
CORDS BIPOLAR (ELECTRODE) ×2 IMPLANT
COVER MAYO STAND STRL (DRAPES) ×2 IMPLANT
COVER TABLE BACK 60X90 (DRAPES) ×2 IMPLANT
CUFF TOURNIQUET SINGLE 18IN (TOURNIQUET CUFF) ×2 IMPLANT
DRAPE EXTREMITY T 121X128X90 (DRAPE) ×2 IMPLANT
DRAPE SURG 17X23 STRL (DRAPES) ×2 IMPLANT
GAUZE PACKING IODOFORM 1/4X5 (PACKING) ×2 IMPLANT
GAUZE XEROFORM 1X8 LF (GAUZE/BANDAGES/DRESSINGS) IMPLANT
GLOVE BIO SURGEON STRL SZ 6.5 (GLOVE) ×2 IMPLANT
GLOVE BIO SURGEON STRL SZ7.5 (GLOVE) ×2 IMPLANT
GLOVE BIOGEL PI IND STRL 7.0 (GLOVE) ×1 IMPLANT
GLOVE BIOGEL PI IND STRL 8 (GLOVE) ×1 IMPLANT
GLOVE BIOGEL PI INDICATOR 7.0 (GLOVE) ×1
GLOVE BIOGEL PI INDICATOR 8 (GLOVE) ×1
GOWN PREVENTION PLUS XLARGE (GOWN DISPOSABLE) ×2 IMPLANT
GOWN STRL REIN XL XLG (GOWN DISPOSABLE) ×2 IMPLANT
LOOP VESSEL MAXI BLUE (MISCELLANEOUS) IMPLANT
NEEDLE HYPO 25X1 1.5 SAFETY (NEEDLE) ×2 IMPLANT
NS IRRIG 1000ML POUR BTL (IV SOLUTION) ×2 IMPLANT
PACK BASIN DAY SURGERY FS (CUSTOM PROCEDURE TRAY) ×2 IMPLANT
PAD CAST 3X4 CTTN HI CHSV (CAST SUPPLIES) IMPLANT
PADDING CAST ABS 4INX4YD NS (CAST SUPPLIES) ×1
PADDING CAST ABS COTTON 4X4 ST (CAST SUPPLIES) ×1 IMPLANT
PADDING CAST COTTON 3X4 STRL (CAST SUPPLIES)
SPLINT PLASTER CAST XFAST 3X15 (CAST SUPPLIES) IMPLANT
SPLINT PLASTER XTRA FASTSET 3X (CAST SUPPLIES)
SPONGE GAUZE 4X4 12PLY (GAUZE/BANDAGES/DRESSINGS) ×2 IMPLANT
STOCKINETTE 4X48 STRL (DRAPES) ×2 IMPLANT
SUT ETHILON 4 0 PS 2 18 (SUTURE) IMPLANT
SWAB CULTURE LIQ STUART DBL (MISCELLANEOUS) ×2 IMPLANT
SYR BULB 3OZ (MISCELLANEOUS) ×2 IMPLANT
SYR CONTROL 10ML LL (SYRINGE) ×2 IMPLANT
TOWEL OR 17X24 6PK STRL BLUE (TOWEL DISPOSABLE) ×2 IMPLANT
TUBE ANAEROBIC SPECIMEN COL (MISCELLANEOUS) ×2 IMPLANT
TUBE FEEDING 5FR 15 INCH (TUBING) IMPLANT
UNDERPAD 30X30 INCONTINENT (UNDERPADS AND DIAPERS) ×2 IMPLANT
WATER STERILE IRR 1000ML POUR (IV SOLUTION) IMPLANT

## 2011-10-28 NOTE — Anesthesia Preprocedure Evaluation (Signed)
Anesthesia Evaluation  Patient identified by MRN, date of birth, ID band Patient awake    Reviewed: Allergy & Precautions, H&P , NPO status , Patient's Chart, lab work & pertinent test results  Airway Mallampati: II  Neck ROM: full    Dental   Pulmonary asthma , Current Smoker,          Cardiovascular     Neuro/Psych    GI/Hepatic   Endo/Other    Renal/GU      Musculoskeletal   Abdominal   Peds  Hematology   Anesthesia Other Findings   Reproductive/Obstetrics                           Anesthesia Physical Anesthesia Plan  ASA: II  Anesthesia Plan: MAC   Post-op Pain Management:    Induction: Intravenous  Airway Management Planned: Simple Face Mask  Additional Equipment:   Intra-op Plan:   Post-operative Plan:   Informed Consent: I have reviewed the patients History and Physical, chart, labs and discussed the procedure including the risks, benefits and alternatives for the proposed anesthesia with the patient or authorized representative who has indicated his/her understanding and acceptance.     Plan Discussed with: CRNA and Surgeon  Anesthesia Plan Comments:         Anesthesia Quick Evaluation

## 2011-10-28 NOTE — Op Note (Signed)
Dictation [phone]

## 2011-10-28 NOTE — ED Provider Notes (Addendum)
History     CSN: [id number]  Arrival date & time 10/28/11  4696   First MD Initiated Contact with Patient 10/28/11 [phone]      Chief Complaint  Patient presents with  . infection finger     (Consider location/radiation/quality/duration/timing/severity/associated sxs/prior treatment) HPI Patient presents today with right middle finger swollen red and painful. She states she's not sure what she did to it but thought she had a splint for 3 days ago. 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 She and her husband attempted to get something out of it 3 days ago. She's had increased swelling, redness, and pus since that time. She states the entire finger is now sore up the hand and into her arm. She has not noted any fever or chills. She is complaining of muscle aches. Or vomiting. This is her dominant hand. Past Medical History  Diagnosis Date  . Asthma     Past Surgical History  Procedure Date  . Appendectomy   . Cardiac surgery     History reviewed. No pertinent family history.  History  Substance Use Topics  . Smoking status: Current Every Day Smoker -- 0.5 packs/day  . Smokeless tobacco: Not on file  . Alcohol Use: Yes     occasional    OB History    Grav Para Term Preterm Abortions TAB SAB Ect Mult Living                  Review of Systems  Constitutional: Negative for fever, chills, activity change, appetite change and unexpected weight change.  HENT: Negative for sore throat, rhinorrhea, neck pain, neck stiffness and sinus pressure.   Eyes: Negative for visual disturbance.  Respiratory: Negative for cough and shortness of breath.   Cardiovascular: Negative for chest pain and leg swelling.  Gastrointestinal: Negative for vomiting, abdominal pain, diarrhea and blood in stool.  Genitourinary: Negative for dysuria, urgency, frequency, vaginal discharge and difficulty urinating.  Musculoskeletal: Negative for myalgias, arthralgias and gait problem.  Skin:  Negative for color change and rash.  Neurological: Negative for weakness, light-headedness and headaches.  Hematological: Does not bruise/bleed easily.  Psychiatric/Behavioral: Negative for dysphoric mood.    Allergies  Erythromycin; Nitrofurantoin monohyd macro; and Penicillins  Home Medications   Current Outpatient Rx  Name Route Sig Dispense Refill  . ACETAMINOPHEN 500 MG PO TABS Oral Take 1,000 mg by mouth once as needed. For pain      . ALBUTEROL SULFATE HFA 108 (90 BASE) MCG/ACT IN AERS Inhalation Inhale 2 puffs into the lungs every 6 (six) hours as needed. For shortness of breath     . ASPIRIN 325 MG PO TBEC Oral Take 325 mg by mouth daily. For pain     . IBUPROFEN 200 MG PO TABS Oral Take 800 mg by mouth once as needed. For pain     . OMEPRAZOLE 20 MG PO CPDR Oral Take 1 capsule (20 mg total) by mouth daily.    . SUCRALFATE 1 G PO TABS  One by mouth with meals 3 times a day and at bedtime 40 tablet 0  . VITAMIN C 500 MG PO TABS Oral Take 1,000 mg by mouth daily.        BP 151/89  Pulse 68  Temp 97.6 F (36.4 C) (Oral)  Resp 20  Ht 5' 2.5" (1.588 m)  Wt 116 lb (52.617 kg)  BMI 20.88 kg/m2  SpO2 99%  LMP 10/26/2011  Physical Exam  Nursing note  and vitals reviewed. Constitutional: She appears well-developed and well-nourished.  HENT:  Head: Normocephalic and atraumatic.  Eyes: Conjunctivae normal and EOM are normal. Pupils are equal, round, and reactive to light.  Neck: Normal range of motion. Neck supple.  Cardiovascular: Normal rate, regular rhythm, normal heart sounds and intact distal pulses.   Pulmonary/Chest: Effort normal and breath sounds normal.  Abdominal: Soft. Bowel sounds are normal.  Musculoskeletal:       Right hand with wound on the palmar surface of right third finger between PIP and DIPs joints. Entire finger is red and swollen. She has pain with attempted flexion and extension. She has some erythema to the palm of her hand. He has full active range  of motion of her wrist and elbow. No streaking is noted upper arm.  Neurological: She is alert.  Skin: Skin is warm and dry.  Psychiatric: She has a normal mood and affect. Thought content normal.    ED Course  Procedures (including critical care time)  Labs Reviewed  CBC WITH DIFFERENTIAL - Abnormal; Notable for the following:    WBC 11.6 (*)     MCH 35.3 (*)     MCHC 36.2 (*)     Neutro Abs 8.5 (*)     All other components within normal limits  BASIC METABOLIC PANEL   No results found.   No diagnosis found.    MDM  Patient's care discussed with Dr. Merlyn Lot through his nurse. She is to remain n.p.o. White blood cell count was obtained and is elevated. [facility] to [facility]. I discussed with her that she is to remain n.p.o. She and her husband voice understanding. They are given directions instructions are given my phone number to call directly if they have problems getting to [facility]. She is discharged this time to go directly there for surgery.     Hilario Quarry, MD 10/28/11 1003  Dr. Merlyn Lot requested that no antibiotics given at this time as he would like to obtain cultures prior to antibiotics.  Hilario Quarry, MD 10/28/11 1005

## 2011-10-28 NOTE — Anesthesia Postprocedure Evaluation (Signed)
Anesthesia Post Note  Patient: Angela Page  Procedure(s) Performed: Procedure(s) (LRB): IRRIGATION AND DEBRIDEMENT EXTREMITY (Right)  Anesthesia type: General  Patient location: PACU  Post pain: Pain level controlled and Adequate analgesia  Post assessment: Post-op Vital signs reviewed, Patient's Cardiovascular Status Stable, Respiratory Function Stable, Patent Airway and Pain level controlled  Last Vitals:  Filed Vitals:   10/28/11 1745  BP: 138/73  Pulse: 65  Temp:   Resp: 13    Post vital signs: Reviewed and stable  Level of consciousness: awake, alert  and oriented  Complications: No apparent anesthesia complications

## 2011-10-28 NOTE — ED Notes (Signed)
Right middle finger swollen red painful afraid wound has become infected

## 2011-10-28 NOTE — Anesthesia Procedure Notes (Signed)
Procedure Name: LMA Insertion Date/Time: 10/28/2011 4:40 PM Performed by: Gar Gibbon Pre-anesthesia Checklist: Patient identified, Emergency Drugs available, Suction available and Patient being monitored Patient Re-evaluated:Patient Re-evaluated prior to inductionOxygen Delivery Method: Circle System Utilized Preoxygenation: Pre-oxygenation with 100% oxygen Intubation Type: IV induction Ventilation: Mask ventilation without difficulty LMA: LMA inserted LMA Size: 4.0 Number of attempts: 1 Airway Equipment and Method: bite block Placement Confirmation: positive ETCO2 Tube secured with: Tape Dental Injury: Teeth and Oropharynx as per pre-operative assessment

## 2011-10-28 NOTE — ED Notes (Signed)
Report called to Morrie Sheldon, RN Day Surgery.

## 2011-10-28 NOTE — ED Notes (Signed)
Pt d/c'd from [facility] with instructions to follow up at Day Surgery after leaving.  Day Surgery informed of pt arrival.

## 2011-10-28 NOTE — Progress Notes (Signed)
Pt pulled dressing off while changing her clothes. Applied 2 4x4 gauze and wrapped with 1 inch coban.

## 2011-10-28 NOTE — H&P (Signed)
Angela Page is an 41 y.o. female.   Chief Complaint: right long finger infection HPI: 41 yo rhd female states she noted what looked like a splinter in right long finger 3 days ago.  Boyfriend tried to remove with sterilized needle.  Does not know where she got the splinter.  Works as Theatre manager at [employer].Has had increasing pain and swelling in finger.  No fevers, chills, night sweats.  Reports no previous injury to finger and no other injury at this time.  Past Medical History  Diagnosis Date  . Asthma     Past Surgical History  Procedure Date  . Appendectomy   . Cardiac surgery     No family history on file. Social History:  reports that she has been smoking.  She does not have any smokeless tobacco history on file. She reports that she drinks alcohol. She reports that she does not use illicit drugs.  Allergies:  Allergies  Allergen Reactions  . Erythromycin Nausea And Vomiting  . Nitrofurantoin Monohyd Macro Nausea And Vomiting  . Penicillins Hives    Medications Prior to Admission  Medication Sig Dispense Refill  . acetaminophen (TYLENOL) 500 MG tablet Take 1,000 mg by mouth once as needed. For pain        . albuterol (PROVENTIL HFA;VENTOLIN HFA) 108 (90 BASE) MCG/ACT inhaler Inhale 2 puffs into the lungs every 6 (six) hours as needed. For shortness of breath       . aspirin 325 MG EC tablet Take 325 mg by mouth daily. For pain       . ibuprofen (ADVIL,MOTRIN) 200 MG tablet Take 800 mg by mouth once as needed. For pain       . omeprazole (PRILOSEC) 20 MG capsule Take 1 capsule (20 mg total) by mouth daily.      . sucralfate (CARAFATE) 1 G tablet One by mouth with meals 3 times a day and at bedtime  40 tablet  0  . vitamin C (ASCORBIC ACID) 500 MG tablet Take 1,000 mg by mouth daily.          Results for orders placed during the hospital encounter of 10/28/11 (from the past 48 hour(s))  CBC WITH DIFFERENTIAL     Status: Abnormal   Collection Time   10/28/11  9:45 AM      Component Value Range Comment   WBC 11.6 (*) 4.0 - 10.5 K/uL    RBC 3.97  3.87 - 5.11 MIL/uL    Hemoglobin 14.0  12.0 - 15.0 g/dL    HCT 16.1  09.6 - 04.5 %    MCV 97.5  78.0 - 100.0 fL    MCH 35.3 (*) 26.0 - 34.0 pg    MCHC 36.2 (*) 30.0 - 36.0 g/dL    RDW 40.9  81.1 - 91.4 %    Platelets 280  150 - 400 K/uL    Neutrophils Relative 74  43 - 77 %    Neutro Abs 8.5 (*) 1.7 - 7.7 K/uL    Lymphocytes Relative 18  12 - 46 %    Lymphs Abs 2.1  0.7 - 4.0 K/uL    Monocytes Relative 6  3 - 12 %    Monocytes Absolute 0.7  0.1 - 1.0 K/uL    Eosinophils Relative 2  0 - 5 %    Eosinophils Absolute 0.2  0.0 - 0.7 K/uL    Basophils Relative 0  0 - 1 %    Basophils  Absolute 0.0  0.0 - 0.1 K/uL   BASIC METABOLIC PANEL     Status: Abnormal   Collection Time   10/28/11  9:45 AM      Component Value Range Comment   Sodium 139  135 - 145 mEq/L    Potassium 4.1  3.5 - 5.1 mEq/L    Chloride 105  96 - 112 mEq/L    CO2 22  19 - 32 mEq/L    Glucose, Bld 83  70 - 99 mg/dL    BUN 5 (*) 6 - 23 mg/dL    Creatinine, Ser 1.19  0.50 - 1.10 mg/dL    Calcium 9.4  8.4 - 14.7 mg/dL    GFR calc non Af Amer >90  >90 mL/min    GFR calc Af Amer >90  >90 mL/min     Dg Finger Middle Right  10/28/2011  *RADIOLOGY REPORT*  Clinical Data: Removed splinter from middle finger 3 days ago, now with redness, swelling, pain and open wound, question foreign body  RIGHT MIDDLE FINGER 2+V  Comparison: None  Findings: Diffuse soft tissue swelling right middle finger. Osseous mineralization normal. Joint spaces preserved. No acute fracture, dislocation or bone destruction. No definite radiopaque foreign body or soft tissue gas.  IMPRESSION: No acute osseous findings or retained radiopaque foreign body.   Original Report Authenticated By: Lollie Marrow, M.D.      A comprehensive review of systems was negative except for: Constitutional: positive for night sweats and nausea  Last menstrual  period 10/26/2011.  General appearance: alert, cooperative and appears stated age Head: Normocephalic, without obvious abnormality, atraumatic Neck: supple, symmetrical, trachea midline Resp: clear to auscultation bilaterally Cardio: regular rate and rhythm GI: non tender Extremities: light touch sensation and capillary refill intact all digits.  +epl/fpl/io.  right long with wound volar radial aspect of middle phalanx.  swollen and erythematous.  no dorsal tenderness.  not tender over proximal phalanx flexor sheath or at mp joint.  no palmar tenderness.  no proximal streaking. Pulses: 2+ and symmetric Skin: as above  Neurologic: Grossly normal Incision/Wound: As above  Assessment/Plan Right long finger infection.  Discussed nature of condition with patient.  Recommend I&D in OR.  Doubt this involves flexor sheath, though discussed possible need for flexor sheath I&D if it appears to in OR.  Risks, benefits, and alternatives of surgery were discussed and the patient agrees with the plan of care.   [name] R 10/28/2011, 12:01 PM

## 2011-10-28 NOTE — Transfer of Care (Signed)
Immediate Anesthesia Transfer of Care Note  Patient: Angela Page  Procedure(s) Performed: Procedure(s) (LRB) with comments: IRRIGATION AND DEBRIDEMENT EXTREMITY (Right) - Irrigation and debridement right middle finger  Patient Location: PACU  Anesthesia Type: General  Level of Consciousness: awake and alert   Airway & Oxygen Therapy: Patient Spontanous Breathing and Patient connected to face mask oxygen  Post-op Assessment: Report given to PACU RN and Post -op Vital signs reviewed and stable  Post vital signs: Reviewed and stable  Complications: No apparent anesthesia complications

## 2011-10-29 ENCOUNTER — Encounter (HOSPITAL_BASED_OUTPATIENT_CLINIC_OR_DEPARTMENT_OTHER): Payer: Self-pay | Admitting: Orthopedic Surgery

## 2011-10-29 NOTE — Op Note (Signed)
NAMEJAMEA, LAT NO.:  [id number]  MEDICAL RECORD NO.:  [id number]  LOCATION:  [facility]                        FACILITY:  [facility]  PHYSICIAN:  Betha Loa, MD        DATE OF BIRTH:  [birth year]  DATE OF PROCEDURE:  10/28/2011 DATE OF DISCHARGE:  10/28/2011                              OPERATIVE REPORT   PREOPERATIVE DIAGNOSIS:  Right long finger infection.  POSTOPERATIVE DIAGNOSIS:  Right long finger infection.  PROCEDURE:  Irrigation and debridement, right long finger infection.  SURGEON:  Betha Loa, MD.  ASSISTANT:  None.  ANESTHESIA:  General.  IV FLUIDS:  Per anesthesia flow sheet.  ESTIMATED BLOOD LOSS:  Minimal.  COMPLICATIONS:  None.  SPECIMENS:  Cultures to micro for aerobes, anaerobes, AFB, fungus.  TOURNIQUET TIME:  14 minutes.  DISPOSITION:  Stable to PACU.  INDICATIONS:  Ms. Hollingworth is a 41 year old female who approximately 3 days ago noted a splinter in her right long finger.  Her boyfriend tried to remove it with a sterilized needle.  She has continued to have increasing pain, swelling, and erythema in the finger.  She was seen at [facility] and referred to me for further care.  She has reported no fevers, chills, or night sweats.  On examination, she has intact sensation and capillary refill in all fingertips with the exception of right long finger where she states she has decreased sensation.  She can flex and extend the IP joint thumb across fingers.  There was a wound at the volar aspect of the middle phalanx of the right long finger.  There is no tenderness over the proximal or distal phalanges.  She is tender at the area of the wound. There is development of a streak in the forearm.  She is not tender in the palm of the hand.  She can flex the finger without undue pain.  I discussed with Ms. Doverspike the nature of this condition.  I recommended irrigation and debridement in the operating room.  Risks, benefits,  and alternatives of surgery were discussed including risk of blood loss, infection, damage to nerves, vessels, tendons, ligaments, bone; failure of surgery; need for additional surgery, complications with wound healing, continued pain, continued infection, need for repeat irrigation and debridement.  She voiced understanding of these risks and elected to proceed.  OPERATIVE COURSE:  After being identified preoperatively by myself, the patient and I agreed upon procedure and site of procedure.  The surgery site was marked.  The risks, benefits, and alternatives were reviewed and she wished to proceed.  Surgical consent had been signed. Antibiotics were held for cultures.  She was transported to the operating room, placed on the operative table in a supine position with the right upper extremity on arm board.  General anesthesia was induced by the anesthesiologist.  The right upper extremity was prepped and draped in normal sterile orthopedic fashion.  A surgical pause was performed between surgeons, anesthesia, operating staff, and all were in agreement as to the patient, procedure, and site of procedure. Tourniquet at the proximal aspect of the forearm was inflated to 220 mmHg after gravity exsanguination of the forearm.  Incision was made  on the volar aspect of the finger incorporating the wound.  This was carried into subcutaneous tissues by spreading technique.  Bipolar electrocautery was used to obtain hemostasis.  There was no gross purulence, but there was significant fluid in the subcutaneous tissues. Cultures were taken for aerobes, anaerobes, AFB, and fungus.  The flexor tendon sheath was identified and there was no purulence within the sheath.  The radial and digital nerve was identified and was intact. The wound was copiously irrigated with sterile saline.  It was then packed with quarter-inch iodoform gauze.  Digital block was performed with 9 mL of 0.25% plain Marcaine to  aid in postoperative analgesia. The wound was then dressed with sterile Xeroform, 4 x 4s, and a Coban dressing.  Vancomycin was started after cultures were taken.  The patient was awakened from anesthesia safely.  She was transferred back to stretcher and taken to PACU in stable condition.  Tourniquet was deflated at 14 minutes.  She was taken back to PACU.  I will see her back in the office in 3 days for postoperative followup.  I will give her doxycycline 100 mg p.o. b.i.d. x10 days and Percocet 5/325, 1-2 p.o. q.6 hours p.r.n. pain, dispensed #40.     Betha Loa, MD     KK/MEDQ  D:  10/28/2011  T:  10/29/2011  Job:  [id number]

## 2011-10-29 NOTE — Op Note (Deleted)
NAME:  Carrick, Jara             ACCOUNT NO.:  [id number]  MEDICAL RECORD NO.:  [id number]  LOCATION:  [facility]                        FACILITY:  [facility]  PHYSICIAN:  Allen Basista, MD        DATE OF BIRTH:  08/02/1970  DATE OF PROCEDURE:  10/28/2011 DATE OF DISCHARGE:  10/28/2011                              OPERATIVE REPORT   PREOPERATIVE DIAGNOSIS:  Right long finger infection.  POSTOPERATIVE DIAGNOSIS:  Right long finger infection.  PROCEDURE:  Irrigation and debridement, right long finger infection.  SURGEON:  Palmira Stickle, MD.  ASSISTANT:  None.  ANESTHESIA:  General.  IV FLUIDS:  Per anesthesia flow sheet.  ESTIMATED BLOOD LOSS:  Minimal.  COMPLICATIONS:  None.  SPECIMENS:  Cultures to micro for aerobes, anaerobes, AFB, fungus.  TOURNIQUET TIME:  14 minutes.  DISPOSITION:  Stable to PACU.  INDICATIONS:  Ms. Buffalo is a 41-year-old female who approximately 3 days ago noted a splinter in her right long finger.  Her boyfriend tried to remove it with a sterilized needle.  She has continued to have increasing pain, swelling, and erythema in the finger.  She was seen at [facility] and referred to me for further care.  She has reported no fevers, chills, or night sweats.  On examination, she has intact sensation and capillary refill in all fingertips with the exception of right long finger where she states she has decreased sensation.  She can flex and extend the IP joint thumb across fingers.  There was a wound at the volar aspect of the middle phalanx of the right long finger.  There is no tenderness over the proximal or distal phalanges.  She is tender at the area of the wound. There is development of a streak in the forearm.  She is not tender in the palm of the hand.  She can flex the finger without undue pain.  I discussed with Ms. Absher the nature of this condition.  I recommended irrigation and debridement in the operating room.  Risks, benefits,  and alternatives of surgery were discussed including risk of blood loss, infection, damage to nerves, vessels, tendons, ligaments, bone; failure of surgery; need for additional surgery, complications with wound healing, continued pain, continued infection, need for repeat irrigation and debridement.  She voiced understanding of these risks and elected to proceed.  OPERATIVE COURSE:  After being identified preoperatively by myself, the patient and I agreed upon procedure and site of procedure.  The surgery site was marked.  The risks, benefits, and alternatives were reviewed and she wished to proceed.  Surgical consent had been signed. Antibiotics were held for cultures.  She was transported to the operating room, placed on the operative table in a supine position with the right upper extremity on arm board.  General anesthesia was induced by the anesthesiologist.  The right upper extremity was prepped and draped in normal sterile orthopedic fashion.  A surgical pause was performed between surgeons, anesthesia, operating staff, and all were in agreement as to the patient, procedure, and site of procedure. Tourniquet at the proximal aspect of the forearm was inflated to 220 mmHg after gravity exsanguination of the forearm.  Incision was made   on the volar aspect of the finger incorporating the wound.  This was carried into subcutaneous tissues by spreading technique.  Bipolar electrocautery was used to obtain hemostasis.  There was no gross purulence, but there was significant fluid in the subcutaneous tissues. Cultures were taken for aerobes, anaerobes, AFB, and fungus.  The flexor tendon sheath was identified and there was no purulence within the sheath.  The radial and digital nerve was identified and was intact. The wound was copiously irrigated with sterile saline.  It was then packed with quarter-inch iodoform gauze.  Digital block was performed with 9 mL of 0.25% plain Marcaine to  aid in postoperative analgesia. The wound was then dressed with sterile Xeroform, 4 x 4s, and a Coban dressing.  Vancomycin was started after cultures were taken.  The patient was awakened from anesthesia safely.  She was transferred back to stretcher and taken to PACU in stable condition.  Tourniquet was deflated at 14 minutes.  She was taken back to PACU.  I will see her back in the office in 3 days for postoperative followup.  I will give her doxycycline 100 mg p.o. b.i.d. x10 days and Percocet 5/325, 1-2 p.o. q.6 hours p.r.n. pain, dispensed #40.     Shephanie Romas, MD     KK/MEDQ  D:  10/28/2011  T:  10/29/2011  Job:  [id number] 

## 2011-11-01 LAB — CULTURE, ROUTINE-ABSCESS

## 2011-11-02 LAB — ANAEROBIC CULTURE: Gram Stain: NONE SEEN

## 2011-12-10 LAB — AFB CULTURE WITH SMEAR (NOT AT ARMC): Acid Fast Smear: NONE SEEN

## 2011-12-31 ENCOUNTER — Emergency Department (HOSPITAL_BASED_OUTPATIENT_CLINIC_OR_DEPARTMENT_OTHER): Payer: Self-pay

## 2011-12-31 ENCOUNTER — Encounter (HOSPITAL_BASED_OUTPATIENT_CLINIC_OR_DEPARTMENT_OTHER): Payer: Self-pay | Admitting: *Deleted

## 2011-12-31 ENCOUNTER — Emergency Department (HOSPITAL_BASED_OUTPATIENT_CLINIC_OR_DEPARTMENT_OTHER)
Admission: EM | Admit: 2011-12-31 | Discharge: 2011-12-31 | Disposition: A | Discharge status: Home / Self Care | Payer: Self-pay | Attending: Emergency Medicine | Admitting: Emergency Medicine

## 2011-12-31 DIAGNOSIS — R197 Diarrhea, unspecified: Secondary | ICD-10-CM | POA: Insufficient documentation

## 2011-12-31 DIAGNOSIS — B9689 Other specified bacterial agents as the cause of diseases classified elsewhere: Secondary | ICD-10-CM

## 2011-12-31 DIAGNOSIS — N760 Acute vaginitis: Secondary | ICD-10-CM | POA: Insufficient documentation

## 2011-12-31 DIAGNOSIS — Z7982 Long term (current) use of aspirin: Secondary | ICD-10-CM | POA: Insufficient documentation

## 2011-12-31 DIAGNOSIS — R1084 Generalized abdominal pain: Secondary | ICD-10-CM | POA: Insufficient documentation

## 2011-12-31 DIAGNOSIS — R109 Unspecified abdominal pain: Secondary | ICD-10-CM

## 2011-12-31 DIAGNOSIS — F17200 Nicotine dependence, unspecified, uncomplicated: Secondary | ICD-10-CM | POA: Insufficient documentation

## 2011-12-31 DIAGNOSIS — Z79899 Other long term (current) drug therapy: Secondary | ICD-10-CM | POA: Insufficient documentation

## 2011-12-31 DIAGNOSIS — R748 Abnormal levels of other serum enzymes: Secondary | ICD-10-CM | POA: Insufficient documentation

## 2011-12-31 DIAGNOSIS — J45909 Unspecified asthma, uncomplicated: Secondary | ICD-10-CM | POA: Insufficient documentation

## 2011-12-31 LAB — URINALYSIS, ROUTINE W REFLEX MICROSCOPIC
Glucose, UA: NEGATIVE mg/dL
Ketones, ur: NEGATIVE mg/dL
Leukocytes, UA: NEGATIVE
Nitrite: NEGATIVE
Protein, ur: NEGATIVE mg/dL
pH: 5.5 (ref 5.0–8.0)

## 2011-12-31 LAB — WET PREP, GENITAL
Trich, Wet Prep: NONE SEEN
Yeast Wet Prep HPF POC: NONE SEEN

## 2011-12-31 LAB — CBC WITH DIFFERENTIAL/PLATELET
Basophils Absolute: 0 K/uL (ref 0.0–0.1)
Basophils Relative: 0 % (ref 0–1)
Eosinophils Absolute: 0.1 K/uL (ref 0.0–0.7)
Eosinophils Relative: 1 % (ref 0–5)
HCT: 43.2 % (ref 36.0–46.0)
Hemoglobin: 15.9 g/dL — ABNORMAL HIGH (ref 12.0–15.0)
Lymphocytes Relative: 16 % (ref 12–46)
Lymphs Abs: 2 K/uL (ref 0.7–4.0)
MCH: 35.7 pg — ABNORMAL HIGH (ref 26.0–34.0)
MCHC: 36.8 g/dL — ABNORMAL HIGH (ref 30.0–36.0)
MCV: 97.1 fL (ref 78.0–100.0)
Monocytes Absolute: 0.9 K/uL (ref 0.1–1.0)
Monocytes Relative: 8 % (ref 3–12)
Neutro Abs: 9.2 K/uL — ABNORMAL HIGH (ref 1.7–7.7)
Neutrophils Relative %: 76 % (ref 43–77)
Platelets: 297 K/uL (ref 150–400)
RBC: 4.45 MIL/uL (ref 3.87–5.11)
RDW: 12.3 % (ref 11.5–15.5)
WBC: 12.1 K/uL — ABNORMAL HIGH (ref 4.0–10.5)

## 2011-12-31 LAB — COMPREHENSIVE METABOLIC PANEL
ALT: 13 U/L (ref 0–35)
Alkaline Phosphatase: 62 U/L (ref 39–117)
BUN: 5 mg/dL — ABNORMAL LOW (ref 6–23)
Chloride: 99 mEq/L (ref 96–112)
GFR calc Af Amer: >90 mL/min (ref >90)
Glucose, Bld: 87 mg/dL (ref 70–99)
Potassium: 3.4 mEq/L — ABNORMAL LOW (ref 3.5–5.1)
Total Bilirubin: 0.3 mg/dL (ref 0.3–1.2)

## 2011-12-31 LAB — LIPASE, BLOOD: Lipase: 98 U/L — ABNORMAL HIGH (ref 11–59)

## 2011-12-31 MED ORDER — MORPHINE SULFATE 4 MG/ML IJ SOLN
4.0000 mg | Freq: Once | INTRAMUSCULAR | Status: AC
  Administered 2011-12-31: 4 mg via INTRAVENOUS
  Filled 2011-12-31: qty 1

## 2011-12-31 MED ORDER — HYDROCODONE-ACETAMINOPHEN 5-325 MG PO TABS: 2.0000 | ORAL_TABLET | ORAL | Status: DC | PRN

## 2011-12-31 MED ORDER — METRONIDAZOLE 500 MG PO TABS: 500.0000 mg | ORAL_TABLET | Freq: Two times a day (BID) | ORAL | Status: DC

## 2011-12-31 MED ORDER — ONDANSETRON HCL 4 MG/2ML IJ SOLN
4.0000 mg | Freq: Once | INTRAMUSCULAR | Status: AC
  Administered 2011-12-31: 4 mg via INTRAVENOUS
  Filled 2011-12-31: qty 2

## 2011-12-31 MED ORDER — IOHEXOL 300 MG/ML  SOLN
100.0000 mL | Freq: Once | INTRAMUSCULAR | Status: AC | PRN
  Administered 2011-12-31: 100 mL via INTRAVENOUS

## 2011-12-31 MED ORDER — ONDANSETRON HCL 4 MG PO TABS: 4.0000 mg | ORAL_TABLET | Freq: Four times a day (QID) | ORAL | Status: DC

## 2011-12-31 MED ORDER — SODIUM CHLORIDE 0.9 % IV SOLN
Freq: Once | INTRAVENOUS | Status: AC
  Administered 2011-12-31: 17:00:00 via INTRAVENOUS

## 2011-12-31 MED ORDER — IOHEXOL 300 MG/ML  SOLN: 25.0000 mL | INTRAMUSCULAR | Status: AC

## 2011-12-31 NOTE — ED Notes (Signed)
Patient transported to CT 

## 2011-12-31 NOTE — ED Provider Notes (Signed)
History     CSN: [id number]  Arrival date & time 12/31/11  1540   First MD Initiated Contact with Patient 12/31/11 1550      Chief Complaint  Patient presents with  . Emesis  . Diarrhea    (Consider location/radiation/quality/duration/timing/severity/associated sxs/prior treatment) HPI Comments: Pt states that she was also having malodorous vaginal discharge before her period started:pt c/o severe generalized upper abdominal pain:denies heavy drinking:nothing makes the pain better or worse  Patient is a 41 y.o. female presenting with vomiting and diarrhea. The history is provided by the patient. No language interpreter was used.  Emesis  This is a new problem. The current episode started 2 days ago. The problem occurs 5 to 10 times per day. The problem has not changed since onset.The emesis has an appearance of stomach contents. There has been no fever. Associated symptoms include abdominal pain and diarrhea.  Diarrhea The primary symptoms include abdominal pain, vomiting and diarrhea.    Past Medical History  Diagnosis Date  . Asthma     Past Surgical History  Procedure Date  . Appendectomy   . Cardiac surgery   . I&d extremity 10/28/2011    Procedure: IRRIGATION AND DEBRIDEMENT EXTREMITY;  Surgeon: Tami Ribas, MD;  Location: [facility];  Service: Orthopedics;  Laterality: Right;  Irrigation and debridement right middle finger    History reviewed. No pertinent family history.  History  Substance Use Topics  . Smoking status: Current Every Day Smoker -- 0.5 packs/day  . Smokeless tobacco: Not on file  . Alcohol Use: Yes     Comment: occasional    OB History    Grav Para Term Preterm Abortions TAB SAB Ect Mult Living                  Review of Systems  Constitutional: Negative.   Respiratory: Negative.   Cardiovascular: Negative.   Gastrointestinal: Positive for vomiting, abdominal pain and diarrhea.    Allergies  Erythromycin;  Nitrofurantoin monohyd macro; and Penicillins  Home Medications   Current Outpatient Rx  Name  Route  Sig  Dispense  Refill  . ALBUTEROL SULFATE HFA 108 (90 BASE) MCG/ACT IN AERS   Inhalation   Inhale 2 puffs into the lungs every 6 (six) hours as needed. For shortness of breath          . ASPIRIN 325 MG PO TBEC   Oral   Take 325 mg by mouth daily. For pain          . DOXYCYCLINE HYCLATE 100 MG PO TABS   Oral   Take 1 tablet (100 mg total) by mouth 2 (two) times daily.   20 tablet   0   . IBUPROFEN 200 MG PO TABS   Oral   Take 800 mg by mouth once as needed. For pain          . OMEPRAZOLE 20 MG PO CPDR   Oral   Take 1 capsule (20 mg total) by mouth daily.         . OXYCODONE-ACETAMINOPHEN 5-325 MG PO TABS      1-2 tabs po q6 hours prn pain   40 tablet   0   . SUCRALFATE 1 G PO TABS      One by mouth with meals 3 times a day and at bedtime   40 tablet   0   . VITAMIN C 500 MG PO TABS   Oral   Take 1,000 mg  by mouth daily.             BP 173/90  Pulse 88  Temp 99.3 F (37.4 C) (Oral)  Ht 5\' 2"  (1.575 m)  Wt 112 lb (50.803 kg)  BMI 20.49 kg/m2  SpO2 99%  LMP 12/30/2011  Physical Exam  Nursing note and vitals reviewed. Constitutional: She is oriented to person, place, and time. She appears well-developed and well-nourished.  HENT:  Head: Normocephalic and atraumatic.  Eyes: Conjunctivae normal and EOM are normal.  Neck: Normal range of motion. Neck supple.  Cardiovascular: Normal rate and regular rhythm.   Pulmonary/Chest: Effort normal and breath sounds normal.  Abdominal: Soft. Bowel sounds are normal.       Generalized  Upper abdominal pain  Musculoskeletal: Normal range of motion.  Neurological: She is alert and oriented to person, place, and time.  Skin: Skin is warm and dry.  Psychiatric: She has a normal mood and affect.    ED Course  Procedures (including critical care time)  Labs Reviewed  COMPREHENSIVE METABOLIC PANEL -  Abnormal; Notable for the following:    Potassium 3.4 (*)     BUN 5 (*)     All other components within normal limits  LIPASE, BLOOD - Abnormal; Notable for the following:    Lipase 98 (*)     All other components within normal limits  CBC WITH DIFFERENTIAL - Abnormal; Notable for the following:    WBC 12.1 (*)     Hemoglobin 15.9 (*)     MCH 35.7 (*)     MCHC 36.8 (*)     Neutro Abs 9.2 (*)     All other components within normal limits  WET PREP, GENITAL - Abnormal; Notable for the following:    Clue Cells Wet Prep HPF POC FEW (*)     WBC, Wet Prep HPF POC FEW (*)     All other components within normal limits  URINALYSIS, ROUTINE W REFLEX MICROSCOPIC  PREGNANCY, URINE  GC/CHLAMYDIA PROBE AMP   Ct Abdomen Pelvis W Contrast  12/31/2011  *RADIOLOGY REPORT*  Clinical Data: Vomiting and diarrhea.  CT ABDOMEN AND PELVIS WITH CONTRAST  Technique:  Multidetector CT imaging of the abdomen and pelvis was performed following the standard protocol during bolus administration of intravenous contrast.  Contrast: OMNIPAQUE IOHEXOL 300 MG/ML  SOLN  Comparison: None.  Findings: Lung bases are clear.  Liver gallbladder and bile ducts are normal.  Pancreas spleen and kidneys are normal.  No renal mass or obstruction.  Atherosclerotic aorta without aneurysm.  Negative for bowel obstruction or bowel thickening.  No free fluid.  No mass or adenopathy.  IMPRESSION: No acute abnormality.   Original Report Authenticated By: Janeece Riggers, M.D.      1. BV (bacterial vaginosis)   2. Abdominal pain   3. Vomiting and diarrhea   4. Elevated lipase       MDM  Pt feeling better at this time:pt to have lipase rechecked with ZOX:WRUE treat with flagyl and pain medication        Teressa Lower, NP 01/01/12 1512

## 2011-12-31 NOTE — ED Notes (Signed)
Pt is drinking PO contrast 

## 2011-12-31 NOTE — ED Notes (Signed)
PA at bedside to re-evaluate pt now. Family at bedside.

## 2011-12-31 NOTE — ED Notes (Signed)
Pt c/o n/v/d/x 2 days with abd cramping

## 2011-12-31 NOTE — ED Notes (Signed)
Pt returned from CT, pt visualized smiling and laughing. Does not appear in significant pain.

## 2011-12-31 NOTE — ED Notes (Signed)
Pt given disposable brief per request

## 2011-12-31 NOTE — ED Notes (Signed)
Pt requesting additional pain medication. PA made aware. No new orders received. PA wants to await CT results. Pt made aware.

## 2012-01-01 LAB — GC/CHLAMYDIA PROBE AMP
CT Probe RNA: NEGATIVE
GC Probe RNA: NEGATIVE

## 2012-01-01 NOTE — ED Provider Notes (Signed)
Medical screening examination/treatment/procedure(s) were performed by non-physician practitioner and as supervising physician I was immediately available for consultation/collaboration.  Glynn Octave, MD 01/01/12 1515

## 2012-05-09 ENCOUNTER — Emergency Department (HOSPITAL_COMMUNITY): Payer: Self-pay

## 2012-05-09 ENCOUNTER — Emergency Department (HOSPITAL_COMMUNITY)
Admission: EM | Admit: 2012-05-09 | Discharge: 2012-05-09 | Disposition: A | Discharge status: Home / Self Care | Payer: Self-pay | Attending: Emergency Medicine | Admitting: Emergency Medicine

## 2012-05-09 ENCOUNTER — Encounter (HOSPITAL_COMMUNITY): Payer: Self-pay | Admitting: Physical Medicine and Rehabilitation

## 2012-05-09 DIAGNOSIS — Z79899 Other long term (current) drug therapy: Secondary | ICD-10-CM | POA: Insufficient documentation

## 2012-05-09 DIAGNOSIS — F17200 Nicotine dependence, unspecified, uncomplicated: Secondary | ICD-10-CM | POA: Insufficient documentation

## 2012-05-09 DIAGNOSIS — Z7982 Long term (current) use of aspirin: Secondary | ICD-10-CM | POA: Insufficient documentation

## 2012-05-09 DIAGNOSIS — R002 Palpitations: Secondary | ICD-10-CM | POA: Insufficient documentation

## 2012-05-09 DIAGNOSIS — Z9889 Other specified postprocedural states: Secondary | ICD-10-CM | POA: Insufficient documentation

## 2012-05-09 DIAGNOSIS — I309 Acute pericarditis, unspecified: Secondary | ICD-10-CM | POA: Insufficient documentation

## 2012-05-09 DIAGNOSIS — J45909 Unspecified asthma, uncomplicated: Secondary | ICD-10-CM | POA: Insufficient documentation

## 2012-05-09 DIAGNOSIS — R0602 Shortness of breath: Secondary | ICD-10-CM | POA: Insufficient documentation

## 2012-05-09 LAB — CBC WITH DIFFERENTIAL/PLATELET
Basophils Relative: 0 % (ref 0–1)
Eosinophils Absolute: 0.1 10*3/uL (ref 0.0–0.7)
MCH: 35.2 pg — ABNORMAL HIGH (ref 26.0–34.0)
MCHC: 36.3 g/dL — ABNORMAL HIGH (ref 30.0–36.0)
Neutrophils Relative %: 71 % (ref 43–77)
Platelets: 311 10*3/uL (ref 150–400)
RBC: 4.4 MIL/uL (ref 3.87–5.11)

## 2012-05-09 LAB — BASIC METABOLIC PANEL
GFR calc Af Amer: >90 mL/min (ref >90)
GFR calc non Af Amer: >90 mL/min (ref >90)
Potassium: 4.5 mEq/L (ref 3.5–5.1)
Sodium: 136 mEq/L (ref 135–145)

## 2012-05-09 LAB — POCT I-STAT TROPONIN I: Troponin i, poc: 0 ng/mL (ref 0.00–0.08)

## 2012-05-09 MED ORDER — IBUPROFEN 600 MG PO TABS: 600.0000 mg | ORAL_TABLET | Freq: Three times a day (TID) | ORAL | Status: DC | PRN

## 2012-05-09 MED ORDER — MORPHINE SULFATE 4 MG/ML IJ SOLN
4.0000 mg | Freq: Once | INTRAMUSCULAR | Status: AC
  Administered 2012-05-09: 4 mg via INTRAVENOUS
  Filled 2012-05-09: qty 1

## 2012-05-09 MED ORDER — KETOROLAC TROMETHAMINE 30 MG/ML IJ SOLN
30.0000 mg | Freq: Once | INTRAMUSCULAR | Status: AC
  Administered 2012-05-09: 30 mg via INTRAVENOUS
  Filled 2012-05-09: qty 1

## 2012-05-09 MED ORDER — NITROGLYCERIN 0.4 MG SL SUBL
0.4000 mg | SUBLINGUAL_TABLET | SUBLINGUAL | Status: DC | PRN
  Administered 2012-05-09: 0.4 mg via SUBLINGUAL
  Filled 2012-05-09: qty 25

## 2012-05-09 MED ORDER — IOHEXOL 350 MG/ML SOLN
100.0000 mL | Freq: Once | INTRAVENOUS | Status: AC | PRN
  Administered 2012-05-09: 100 mL via INTRAVENOUS

## 2012-05-09 MED ORDER — IOHEXOL 300 MG/ML  SOLN: 100.0000 mL | Freq: Once | INTRAMUSCULAR | Status: DC | PRN

## 2012-05-09 NOTE — ED Notes (Signed)
Pt to CT at this time.

## 2012-05-09 NOTE — ED Provider Notes (Signed)
History     CSN: [id number]  Arrival date & time 05/09/12  1446   First MD Initiated Contact with Patient 05/09/12 1510      Chief Complaint  Patient presents with  . Chest Pain  . Shortness of Breath    (Consider location/radiation/quality/duration/timing/severity/associated sxs/prior treatment) Patient is a 42 y.o. female presenting with chest pain and shortness of breath. The history is provided by the patient.  Chest Pain Pain location:  Substernal area Pain quality: pressure and stabbing   Pain radiates to the back: no   Pain severity:  Moderate Onset quality:  Gradual Duration:  2 days Timing:  Intermittent Progression:  Worsening Chronicity:  New Associated symptoms: palpitations and shortness of breath   Associated symptoms: no abdominal pain, no cough, no fever and not vomiting   Shortness of Breath Associated symptoms: chest pain   Associated symptoms: no abdominal pain, no cough, no fever, no neck pain, no rash, no vomiting and no wheezing     Past Medical History  Diagnosis Date  . Asthma     Past Surgical History  Procedure Laterality Date  . Appendectomy    . Cardiac surgery    . I&d extremity  10/28/2011    Procedure: IRRIGATION AND DEBRIDEMENT EXTREMITY;  Surgeon: Tami Ribas, MD;  [facility]:  [facility];  Service: Orthopedics;  Laterality: Right;  Irrigation and debridement right middle finger    No family history on file.  History  Substance Use Topics  . Smoking status: Current Every Day Smoker -- 0.50 packs/day    Types: Cigarettes  . Smokeless tobacco: Not on file  . Alcohol Use: Yes     Comment: occasional    OB History   Grav Para Term Preterm Abortions TAB SAB Ect Mult Living                  Review of Systems  Constitutional: Negative for fever, chills, activity change and appetite change.  HENT: Negative for neck pain and neck stiffness.   Respiratory: Positive for shortness of breath. Negative for cough, chest  tightness and wheezing.   Cardiovascular: Positive for chest pain and palpitations. Negative for leg swelling.  Gastrointestinal: Negative for vomiting, abdominal pain, diarrhea and constipation.  Genitourinary: Negative for dysuria, decreased urine volume and difficulty urinating.  Skin: Negative for rash and wound.  Neurological: Negative for seizures, syncope, facial asymmetry and light-headedness.  Psychiatric/Behavioral: Negative for confusion and agitation.  All other systems reviewed and are negative.    Allergies  Erythromycin; Nitrofurantoin monohyd macro; and Penicillins  Home Medications   Current Outpatient Rx  Name  Route  Sig  Dispense  Refill  . albuterol (PROVENTIL HFA;VENTOLIN HFA) 108 (90 BASE) MCG/ACT inhaler   Inhalation   Inhale 2 puffs into the lungs every 6 (six) hours as needed. For shortness of breath          . aspirin 325 MG EC tablet   Oral   Take 325 mg by mouth daily. For pain          . ibuprofen (ADVIL,MOTRIN) 200 MG tablet   Oral   Take 800 mg by mouth once as needed. For pain          . omeprazole (PRILOSEC) 20 MG capsule   Oral   Take 1 capsule (20 mg total) by mouth daily.         . vitamin C (ASCORBIC ACID) 500 MG tablet   Oral   Take 1,000  mg by mouth daily.             BP 144/110  Pulse 71  Temp(Src) 98.8 F (37.1 C) (Oral)  Resp 18  SpO2 99%  LMP 05/09/2012  Physical Exam  Nursing note and vitals reviewed. Constitutional: She is oriented to person, place, and time. She appears well-developed and well-nourished.  HENT:  Head: Normocephalic and atraumatic.  Right Ear: External ear normal.  Left Ear: External ear normal.  Nose: Nose normal.  Mouth/Throat: Oropharynx is clear and moist. No oropharyngeal exudate.  Eyes: Conjunctivae are normal. Pupils are equal, round, and reactive to light.  Neck: Normal range of motion. Neck supple.  Cardiovascular: Normal rate, regular rhythm, normal heart sounds and intact  distal pulses.  Exam reveals no gallop and no friction rub.   No murmur heard. Pulmonary/Chest: Effort normal and breath sounds normal. No respiratory distress. She has no wheezes. She has no rales. She exhibits no tenderness.  Abdominal: Soft. Bowel sounds are normal. She exhibits no distension and no mass. There is no tenderness. There is no rebound and no guarding.  Musculoskeletal: Normal range of motion. She exhibits no edema and no tenderness.  Neurological: She is alert and oriented to person, place, and time.  Skin: Skin is warm and dry.  Psychiatric: She has a normal mood and affect. Her behavior is normal. Judgment and thought content normal.    ED Course  Procedures (including critical care time)  Labs Reviewed  CBC WITH DIFFERENTIAL - Abnormal; Notable for the following:    Hemoglobin 15.5 (*)    MCH 35.2 (*)    MCHC 36.3 (*)    All other components within normal limits  BASIC METABOLIC PANEL  POCT I-STAT TROPONIN I  POCT I-STAT TROPONIN I   Dg Chest 2 View  05/09/2012  *RADIOLOGY REPORT*  Clinical Data: Chest pain, shortness of breath.  CHEST - 2 VIEW  Comparison: 01/05/2011  Findings: Mild hyperinflation of the lungs.  Heart is normal size. No confluent airspace opacities or effusions.  No acute bony abnormality.  IMPRESSION: Hyperinflation.  No acute cardiopulmonary disease.   Original Report Authenticated By: Charlett Nose, M.D.    Ct Angio Chest Aorta W/cm &/or Wo/cm  05/09/2012  *RADIOLOGY REPORT*  Clinical Data: Chest pain for 2 days.Rule out dissection. Current smoker.  CT ANGIOGRAPHY CHEST  Technique:  Multidetector CT imaging of the chest using the standard protocol during bolus administration of intravenous contrast. Multiplanar reconstructed images including MIPs were obtained and reviewed to evaluate the vascular anatomy.  Contrast: OMNIPAQUE IOHEXOL 350 MG/ML SOLN  Comparison: Plain film of earlier in the day.  No prior CT.  Findings: Lungs/pleura: Lower lobe  predominant bronchial wall thickening.  Concurrent mild centrilobular emphysema.There is subtle micro nodularity, likely centrilobular in distribution. This is upper lobe predominant.  Larger right upper lobe nodules which measure 5 mm on image 32/series 7 and 4 mm on image 43/series 7. No pleural fluid.  Heart/Mediastinum: No evidence of intramural hematoma on unenhanced imaging.  There is coronary artery atherosclerosis, including within the LAD on image 29/series 3 and proximal left circumflex on image 28/series 3.  Postcontrast images demonstrate normal appearance of the great vessels.  No evidence of aortic dissection or aneurysm.  Normal heart size, without pericardial effusion.  Although the study was not performed to evaluate pulmonary embolism, no evidence of embolism is seen. No mediastinal or hilar adenopathy.  Upper abdomen: Reflux of contrast into the IVC and hepatic veins could  represent a component of elevated right heart pressures.  Bones/Musculoskeletal:  No acute osseous abnormality.  IMPRESSION:  1.  Normal appearance of the aorta, without evidence of dissection or aneurysm. 2.  Markedly age advanced coronary artery atherosclerosis. Calcified plaques in the proximal LAD and proximal left circumflex. Correlate with risk factors and whether the patient's symptoms could be cardiac in origin. 3. Centrilobular emphysema with upper lobe predominant micro nodularity.  In this smoker, this could represent respiratory bronchiolitis or respiratory bronchiolitis interstitial lung disease, depending on symptomatology of shortness of breath.  In the acute setting, atypical infection cannot be excluded.  4. Larger right upper lobe nodules of up to 5 mm.  Given the concurrent centrilobular emphysema, follow-up chest CT at 6 - 12 months is recommended.  This recommendation follows the consensus statement: "Guidelines for Management of Small Pulmonary Nodules Detected on CT Scans: A Statement from the Fleischner  Society" as published in Radiology 2005; 237:395-400. Online at: DietDisorder.cz.   Original Report Authenticated By: Jeronimo Greaves, M.D.      Date: 05/09/2012  Rate: 72 bpm  Rhythm: normal sinus rhythm  QRS Axis: normal  Intervals: normal  ST/T Wave abnormalities: ST elevations diffusely  Conduction Disutrbances:none  Narrative Interpretation: Diffuse ST elevation and PR depression, consistent with pericarditis  Old EKG Reviewed: none available  1. Pericarditis, acute       MDM  42 yo F presents for 2 days of intermittent chest pain, now constant for 10 hrs, and associated dyspnea and nausea. 325mg  ASA and SL NTG with some relief PTA. Pain very atypical for ACS and EKG consistent with pericarditis. Serial troponins negative. Pt Low risk Wells Criteria and PERC negative. However, due to severity of pt's pain and radiation to back, CTA obtained-negative for thoracic aortic dissection. CT does reveal incidental lung nodule; pt has hx of tobacco use-pt counseled for repeat CT in 6 months follow-up and expressed understanding. Pt also counseled on use of NSAID's for pericarditis and expressed understanding. Patient given return precautions, including worsening of signs or symptoms. Patient instructed to follow-up with Cardiology regarding pericarditis.         Clemetine Marker, MD 05/09/12 1949

## 2012-05-09 NOTE — ED Notes (Signed)
Pt st's NTG only helped a little requesting pain med.

## 2012-05-09 NOTE — ED Provider Notes (Signed)
Complains of chest pain originating at left scapula onset yesterday evening. Lasted until she went to bed last night. She slept the entire night awakened 11 AM today. Pain has continued and has been constant since 11 AM progressively worsening. Pain was worse with exertion yesterday not made worse with exertion today. Described as dull no associated shortness of breath nausea or sweatiness. 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

## 2012-05-09 NOTE — ED Notes (Signed)
Pt presents to department for evaluation of sudden onset of midsternal chest pain radiating to back, began on Friday afternoon. Upon arrival 7/10 "squeezing" pressure. Also states SOB and nausea. Received (2) sublingual nitroglycerin per EMS with some relief, also 325 ASA by fire department. Respirations unlabored upon arrival. Skin warm and dry. Pt is alert and oriented x4.

## 2012-05-09 NOTE — ED Provider Notes (Signed)
I have personally seen and examined the patient.  I have discussed the plan of care with the resident.  I have reviewed the documentation on PMH/FH/Soc. History.  I have reviewed the documentation of the resident and agree.  Doug Sou, MD 05/09/12 2004

## 2012-05-09 NOTE — ED Notes (Signed)
Pt returns from radiology, family at bedside placed back on monitor.

## 2012-08-19 ENCOUNTER — Emergency Department (HOSPITAL_BASED_OUTPATIENT_CLINIC_OR_DEPARTMENT_OTHER)
Admission: EM | Admit: 2012-08-19 | Discharge: 2012-08-19 | Disposition: A | Discharge status: Home / Self Care | Payer: Self-pay | Attending: Emergency Medicine | Admitting: Emergency Medicine

## 2012-08-19 ENCOUNTER — Encounter (HOSPITAL_BASED_OUTPATIENT_CLINIC_OR_DEPARTMENT_OTHER): Payer: Self-pay | Admitting: *Deleted

## 2012-08-19 DIAGNOSIS — F17200 Nicotine dependence, unspecified, uncomplicated: Secondary | ICD-10-CM | POA: Insufficient documentation

## 2012-08-19 DIAGNOSIS — Z7982 Long term (current) use of aspirin: Secondary | ICD-10-CM | POA: Insufficient documentation

## 2012-08-19 DIAGNOSIS — Z79899 Other long term (current) drug therapy: Secondary | ICD-10-CM | POA: Insufficient documentation

## 2012-08-19 DIAGNOSIS — R109 Unspecified abdominal pain: Secondary | ICD-10-CM | POA: Insufficient documentation

## 2012-08-19 DIAGNOSIS — Z3202 Encounter for pregnancy test, result negative: Secondary | ICD-10-CM | POA: Insufficient documentation

## 2012-08-19 DIAGNOSIS — J45909 Unspecified asthma, uncomplicated: Secondary | ICD-10-CM | POA: Insufficient documentation

## 2012-08-19 DIAGNOSIS — Z880 Allergy status to penicillin: Secondary | ICD-10-CM | POA: Insufficient documentation

## 2012-08-19 DIAGNOSIS — N390 Urinary tract infection, site not specified: Secondary | ICD-10-CM | POA: Insufficient documentation

## 2012-08-19 LAB — URINALYSIS, ROUTINE W REFLEX MICROSCOPIC
Bilirubin Urine: NEGATIVE
Glucose, UA: NEGATIVE mg/dL
Protein, ur: NEGATIVE mg/dL

## 2012-08-19 LAB — URINE MICROSCOPIC-ADD ON

## 2012-08-19 LAB — PREGNANCY, URINE: Preg Test, Ur: NEGATIVE

## 2012-08-19 MED ORDER — PHENAZOPYRIDINE HCL 200 MG PO TABS: 200.0000 mg | ORAL_TABLET | Freq: Three times a day (TID) | ORAL | Status: DC

## 2012-08-19 MED ORDER — CIPROFLOXACIN HCL 500 MG PO TABS: 500.0000 mg | ORAL_TABLET | Freq: Two times a day (BID) | ORAL | Status: DC

## 2012-08-19 NOTE — ED Notes (Signed)
Dysuria. Hx of frequent UTI's

## 2012-08-19 NOTE — ED Provider Notes (Signed)
CSN: [id number]     Arrival date & time 08/19/12  1559 History     First MD Initiated Contact with Patient 08/19/12 1711     Chief Complaint  Patient presents with  . Dysuria   (Consider location/radiation/quality/duration/timing/severity/associated sxs/prior Treatment) Patient is a 42 y.o. female presenting with dysuria. The history is provided by the patient.  Dysuria Pain quality:  Burning Pain severity:  Moderate Onset quality:  Gradual Duration:  3 weeks Timing:  Constant Progression:  Worsening Chronicity:  New Recent urinary tract infections: no   Relieved by:  Nothing Worsened by:  Nothing tried Ineffective treatments:  Phenazopyridine Associated symptoms: abdominal pain   Associated symptoms: no fever     Past Medical History  Diagnosis Date  . Asthma    Past Surgical History  Procedure Laterality Date  . Appendectomy    . Cardiac surgery    . I&d extremity  10/28/2011    Procedure: IRRIGATION AND DEBRIDEMENT EXTREMITY;  Surgeon: Tami Ribas, MD;  Location: [facility];  Service: Orthopedics;  Laterality: Right;  Irrigation and debridement right middle finger   No family history on file. History  Substance Use Topics  . Smoking status: Current Every Day Smoker -- 0.50 packs/day    Types: Cigarettes  . Smokeless tobacco: Not on file  . Alcohol Use: Yes     Comment: occasional   OB History   Grav Para Term Preterm Abortions TAB SAB Ect Mult Living                 Review of Systems  Constitutional: Negative for fever.  Gastrointestinal: Positive for abdominal pain.  Genitourinary: Positive for dysuria.  All other systems reviewed and are negative.    Allergies  Erythromycin; Nitrofurantoin monohyd macro; and Penicillins  Home Medications   Current Outpatient Rx  Name  Route  Sig  Dispense  Refill  . albuterol (PROVENTIL HFA;VENTOLIN HFA) 108 (90 BASE) MCG/ACT inhaler   Inhalation   Inhale 2 puffs into the lungs every 6 (six)  hours as needed. For shortness of breath          . aspirin 325 MG EC tablet   Oral   Take 325 mg by mouth daily. For pain          . ibuprofen (ADVIL,MOTRIN) 200 MG tablet   Oral   Take 800 mg by mouth once as needed. For pain          . ibuprofen (ADVIL,MOTRIN) 600 MG tablet   Oral   Take 1 tablet (600 mg total) by mouth every 8 (eight) hours as needed for pain.   60 tablet   0   . EXPIRED: omeprazole (PRILOSEC) 20 MG capsule   Oral   Take 1 capsule (20 mg total) by mouth daily.         . vitamin C (ASCORBIC ACID) 500 MG tablet   Oral   Take 1,000 mg by mouth daily.            BP 152/92  Pulse 64  Temp(Src) 98.6 F (37 C) (Oral)  Resp 20  Wt 112 lb (50.803 kg)  BMI 20.48 kg/m2  SpO2 100%  LMP 08/12/2012 Physical Exam  Nursing note and vitals reviewed. Constitutional: She is oriented to person, place, and time. She appears well-developed and well-nourished. No distress.  HENT:  Head: Normocephalic and atraumatic.  Mouth/Throat: Oropharynx is clear and moist.  Neck: Normal range of motion. Neck supple.  Abdominal: Soft.  Bowel sounds are normal. She exhibits no distension. There is no tenderness.  Musculoskeletal: Normal range of motion. She exhibits no edema.  Neurological: She is alert and oriented to person, place, and time.  Skin: Skin is warm and dry. She is not diaphoretic.    ED Course   Procedures (including critical care time)  Labs Reviewed  URINALYSIS, ROUTINE W REFLEX MICROSCOPIC - Abnormal; Notable for the following:    APPearance CLOUDY (*)    Hgb urine dipstick TRACE (*)    Leukocytes, UA LARGE (*)    All other components within normal limits  URINE MICROSCOPIC-ADD ON - Abnormal; Notable for the following:    Squamous Epithelial / LPF FEW (*)    Bacteria, UA FEW (*)    All other components within normal limits  URINE CULTURE  PREGNANCY, URINE   No results found. No diagnosis found.  MDM  A UA shows a UTI. Will treat with  Cipro and Pyridium and prn follow up.  Geoffery Lyons, MD 08/19/12 [phone]

## 2012-08-22 LAB — URINE CULTURE

## 2012-08-30 ENCOUNTER — Emergency Department (INDEPENDENT_AMBULATORY_CARE_PROVIDER_SITE_OTHER)
Admission: EM | Admit: 2012-08-30 | Discharge: 2012-08-30 | Disposition: A | Discharge status: Home / Self Care | Payer: Self-pay | Source: Home / Self Care

## 2012-08-30 ENCOUNTER — Encounter (HOSPITAL_COMMUNITY): Payer: Self-pay | Admitting: Emergency Medicine

## 2012-08-30 ENCOUNTER — Other Ambulatory Visit (HOSPITAL_COMMUNITY)
Admission: RE | Admit: 2012-08-30 | Discharge: 2012-08-30 | Disposition: A | Discharge status: Home / Self Care | Payer: Self-pay | Source: Ambulatory Visit | Attending: Family Medicine | Admitting: Family Medicine

## 2012-08-30 DIAGNOSIS — N760 Acute vaginitis: Secondary | ICD-10-CM | POA: Insufficient documentation

## 2012-08-30 DIAGNOSIS — Z113 Encounter for screening for infections with a predominantly sexual mode of transmission: Secondary | ICD-10-CM | POA: Insufficient documentation

## 2012-08-30 MED ORDER — METRONIDAZOLE 500 MG PO TABS: 500.0000 mg | ORAL_TABLET | Freq: Two times a day (BID) | ORAL | Status: DC

## 2012-08-30 MED ORDER — FLUCONAZOLE 150 MG PO TABS: 150.0000 mg | ORAL_TABLET | Freq: Once | ORAL | Status: DC

## 2012-08-30 NOTE — ED Notes (Signed)
C/o vaginal discharge which has an odor for two weeks now.

## 2012-08-30 NOTE — ED Provider Notes (Signed)
Angela Page is a 42 y.o. female who presents to Urgent Care today for for the last 3 weeks. Recently was treated for a UTI. Denies any dysuria nausea vomiting abdominal pain fevers or chills. She has not tried any medication for this. She feels well otherwise. No sexually transmitted infection history.    PMH reviewed. Asthma and history of stomach ulcer History  Substance Use Topics  . Smoking status: Current Every Day Smoker -- 0.50 packs/day    Types: Cigarettes  . Smokeless tobacco: Not on file  . Alcohol Use: Yes     Comment: occasional   ROS as above Medications reviewed. No current facility-administered medications for this encounter.   Current Outpatient Prescriptions  Medication Sig Dispense Refill  . albuterol (PROVENTIL HFA;VENTOLIN HFA) 108 (90 BASE) MCG/ACT inhaler Inhale 2 puffs into the lungs every 6 (six) hours as needed. For shortness of breath       . aspirin 325 MG EC tablet Take 325 mg by mouth daily. For pain       . fluconazole (DIFLUCAN) 150 MG tablet Take 1 tablet (150 mg total) by mouth once.  1 tablet  1  . metroNIDAZOLE (FLAGYL) 500 MG tablet Take 1 tablet (500 mg total) by mouth 2 (two) times daily.  14 tablet  0  . omeprazole (PRILOSEC) 20 MG capsule Take 1 capsule (20 mg total) by mouth daily.      . vitamin C (ASCORBIC ACID) 500 MG tablet Take 1,000 mg by mouth daily.          Exam:  BP 130/86  Pulse 62  Temp(Src) 99.2 F (37.3 C) (Oral)  Resp 16  SpO2 98%  LMP 08/12/2012 Gen: Well NAD HEENT: EOMI,  MMM Lungs: CTABL Nl WOB Heart: RRR no MRG Abd: NABS, NT, ND Exts: Non edematous BL  LE, warm and well perfused.  GYN: Normal external genitalia. Vaginal canal with thin white discharge. Normal-appearing cervix.  No results found for this or any previous visit (from the past 24 hour(s)). No results found.  Assessment and Plan: 42 y.o. female with vaginitis.  Plan empiric treatment for BV and yeast with Flagyl and fluconazole.  Vaginal  discharge cytology for gonorrhea, Chlamydia, yeast, Gardnerella, trichomonas pending.  Will call patient with results.  Discussed warning signs or symptoms. Please see discharge instructions. Patient expresses understanding.      Rodolph Bong, MD 08/30/12 [phone]

## 2012-09-01 NOTE — ED Notes (Signed)
GC/Chlamydia neg., Affirm: Candida and Trich neg., Gardnerella pos. Pt. adequately treated with Flagyl. Tonya Wantz M 09/01/2012  

## 2012-09-15 NOTE — ED Notes (Signed)
Called patient, verified patient with 2 identifiers and told labs negative with the exception of gardnerella and adequately treated with medication.

## 2012-11-27 IMAGING — CR DG CHEST 2V
2 series · 2 of 2 positions shown · non-contrast
Comparison: 10/13/2006.

CLINICAL DATA: Fever.  Cough.  Congestion.

CHEST - 2 VIEW

[w chest pa]
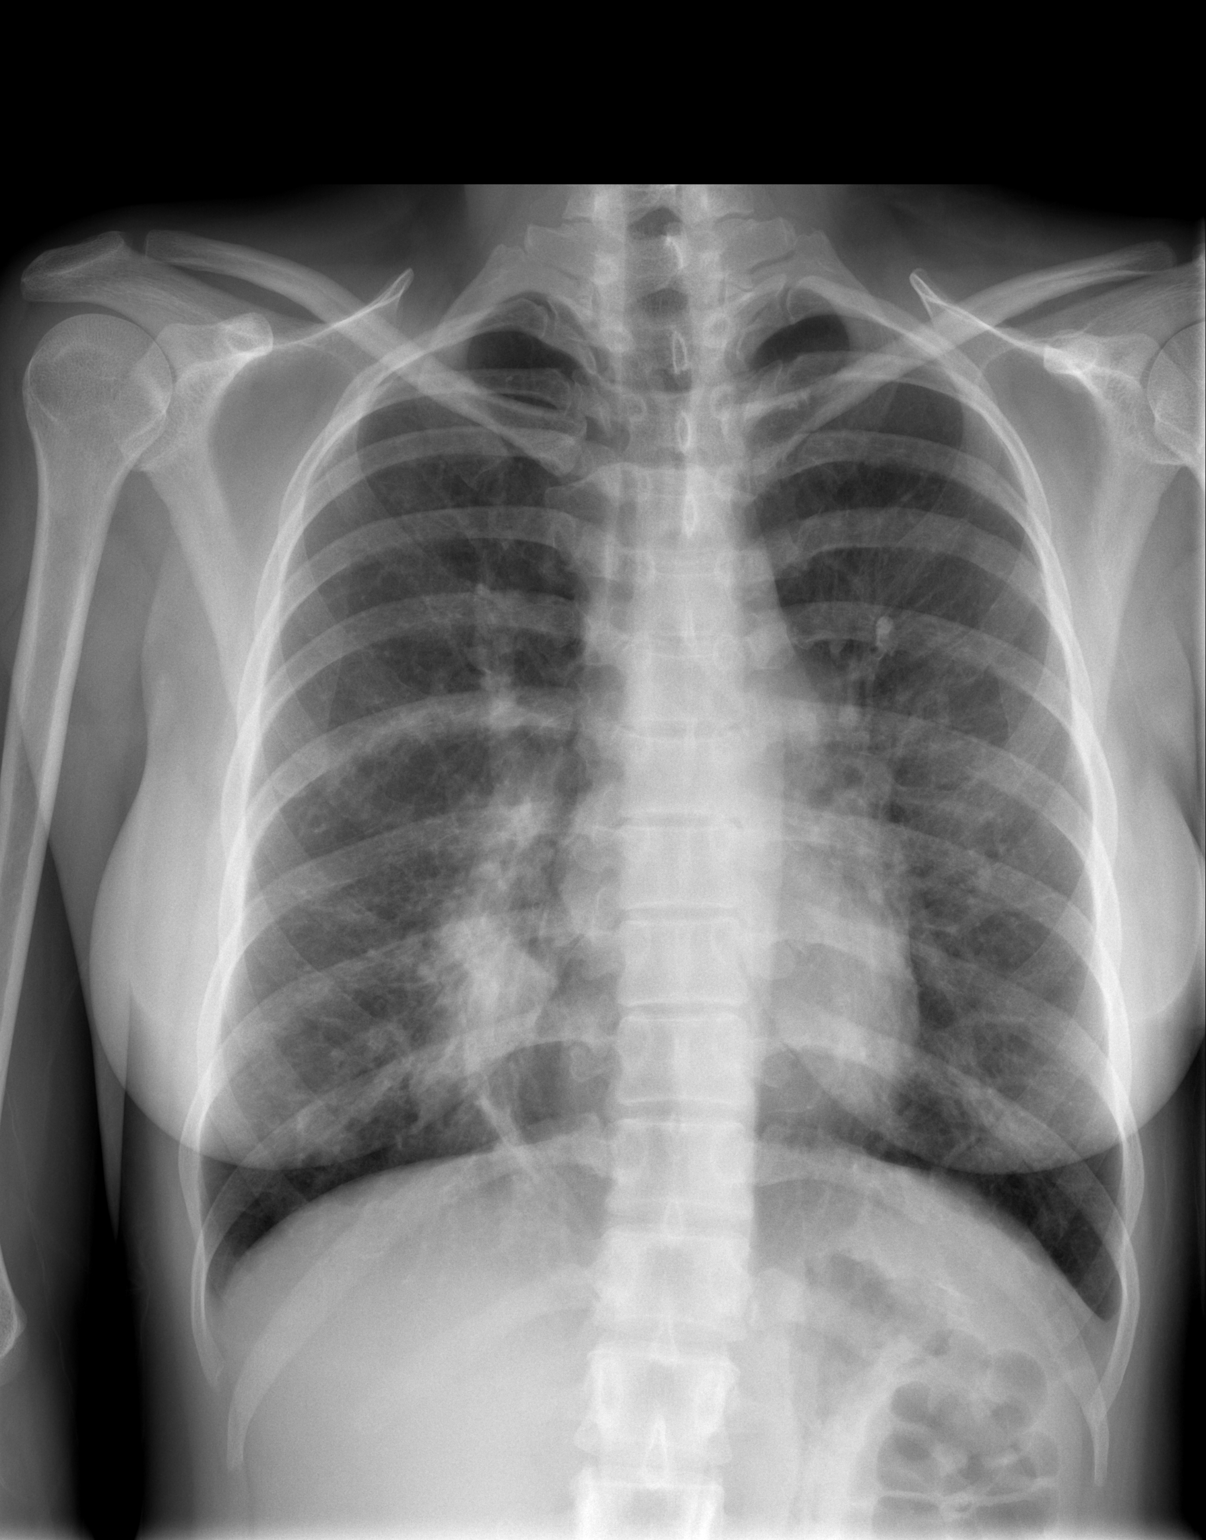

[w chest lat]
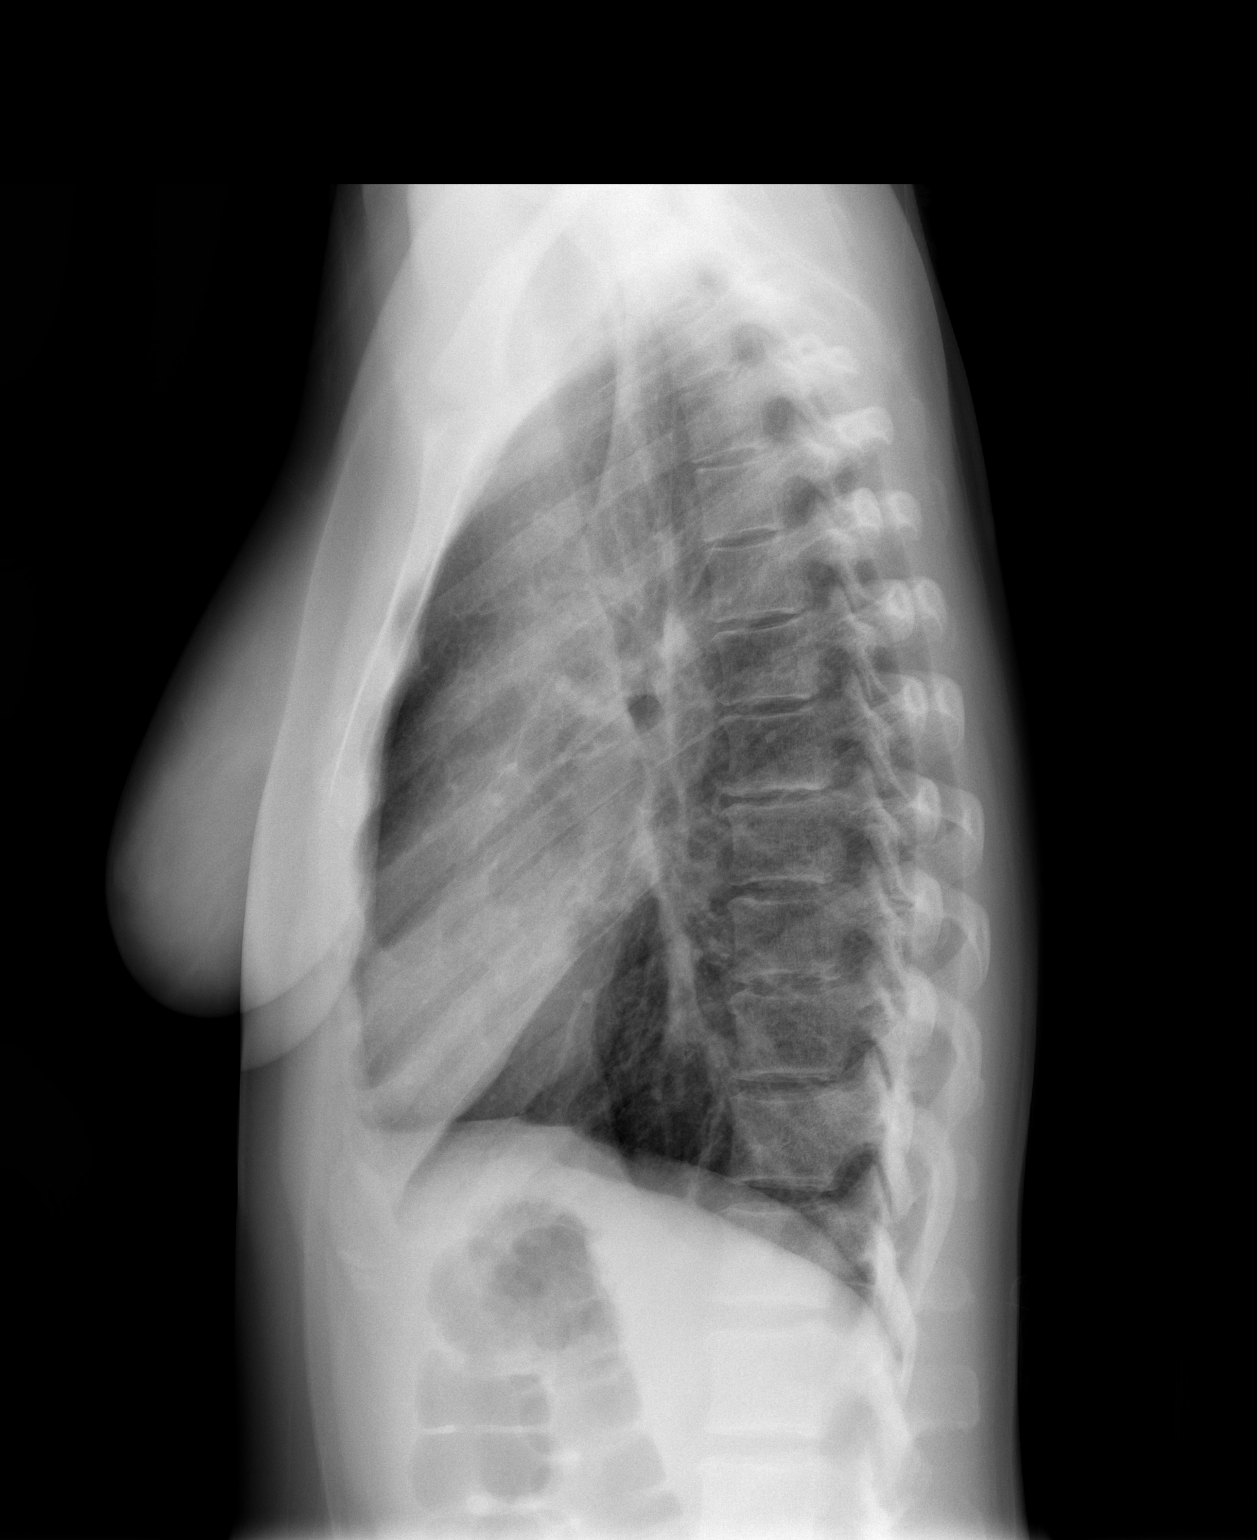

[2 of 2 positions shown; findings below may reference images not displayed]

FINDINGS: Right middle lobe pneumonia is present with dense
consolidation respecting the major fissure.  There is blurring of
the right heart border.  There may be some airspace disease in the
left lower lobe as well.  The cardiopericardial silhouette appears
within normal limits.  No effusion.  Trachea midline.

Follow-up to ensure radiographic clearing recommended.  Clearing is
usually observed at 4-6 weeks.
IMPRESSION: Right middle lobe pneumonia.

## 2012-11-30 IMAGING — CR DG CHEST 2V
2 series · 2 of 2 positions shown · non-contrast
Comparison: 10/24/2010

CLINICAL DATA: Pneumonia, chest pain, fever, cough

CHEST - 2 VIEW

[w chest pa]
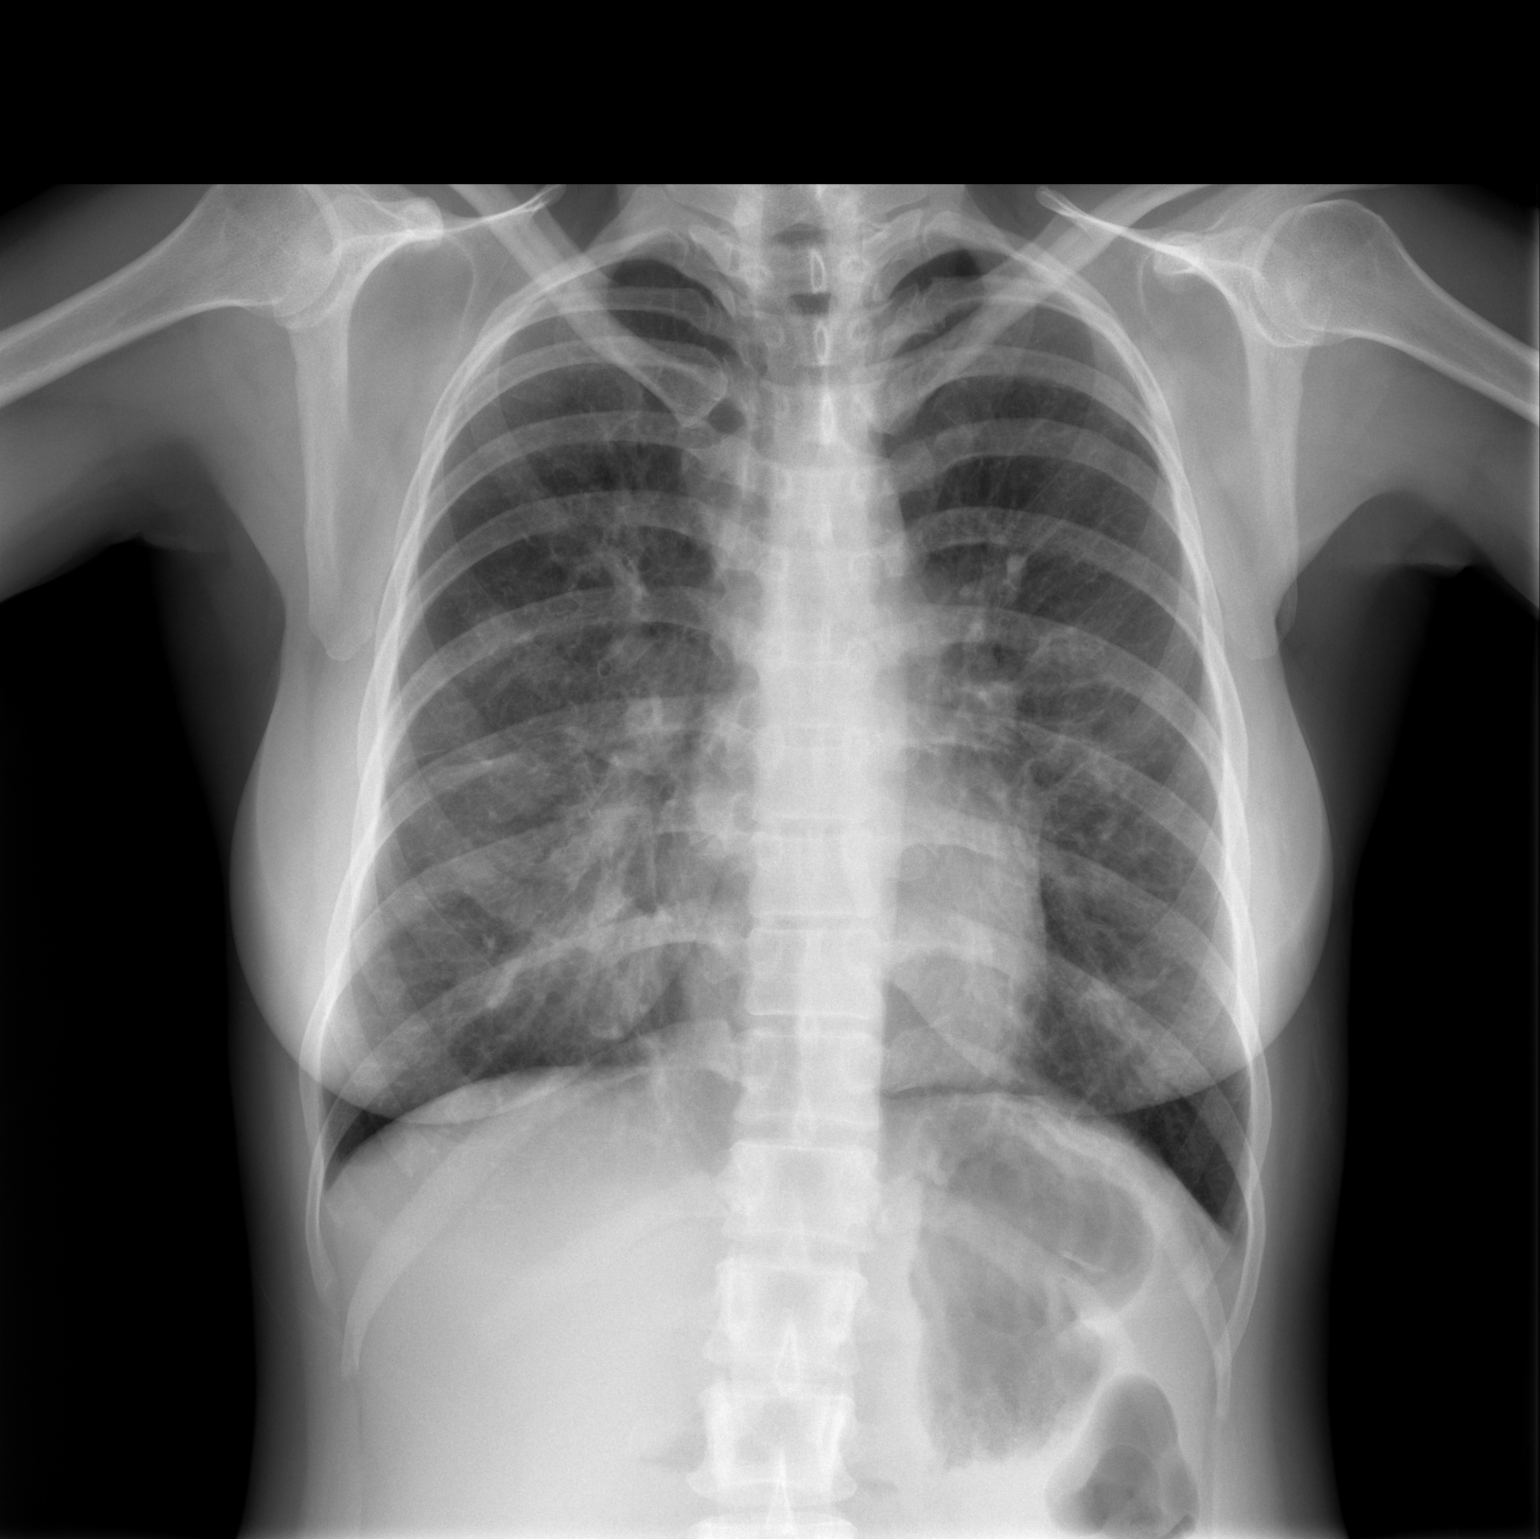

[w chest lat]
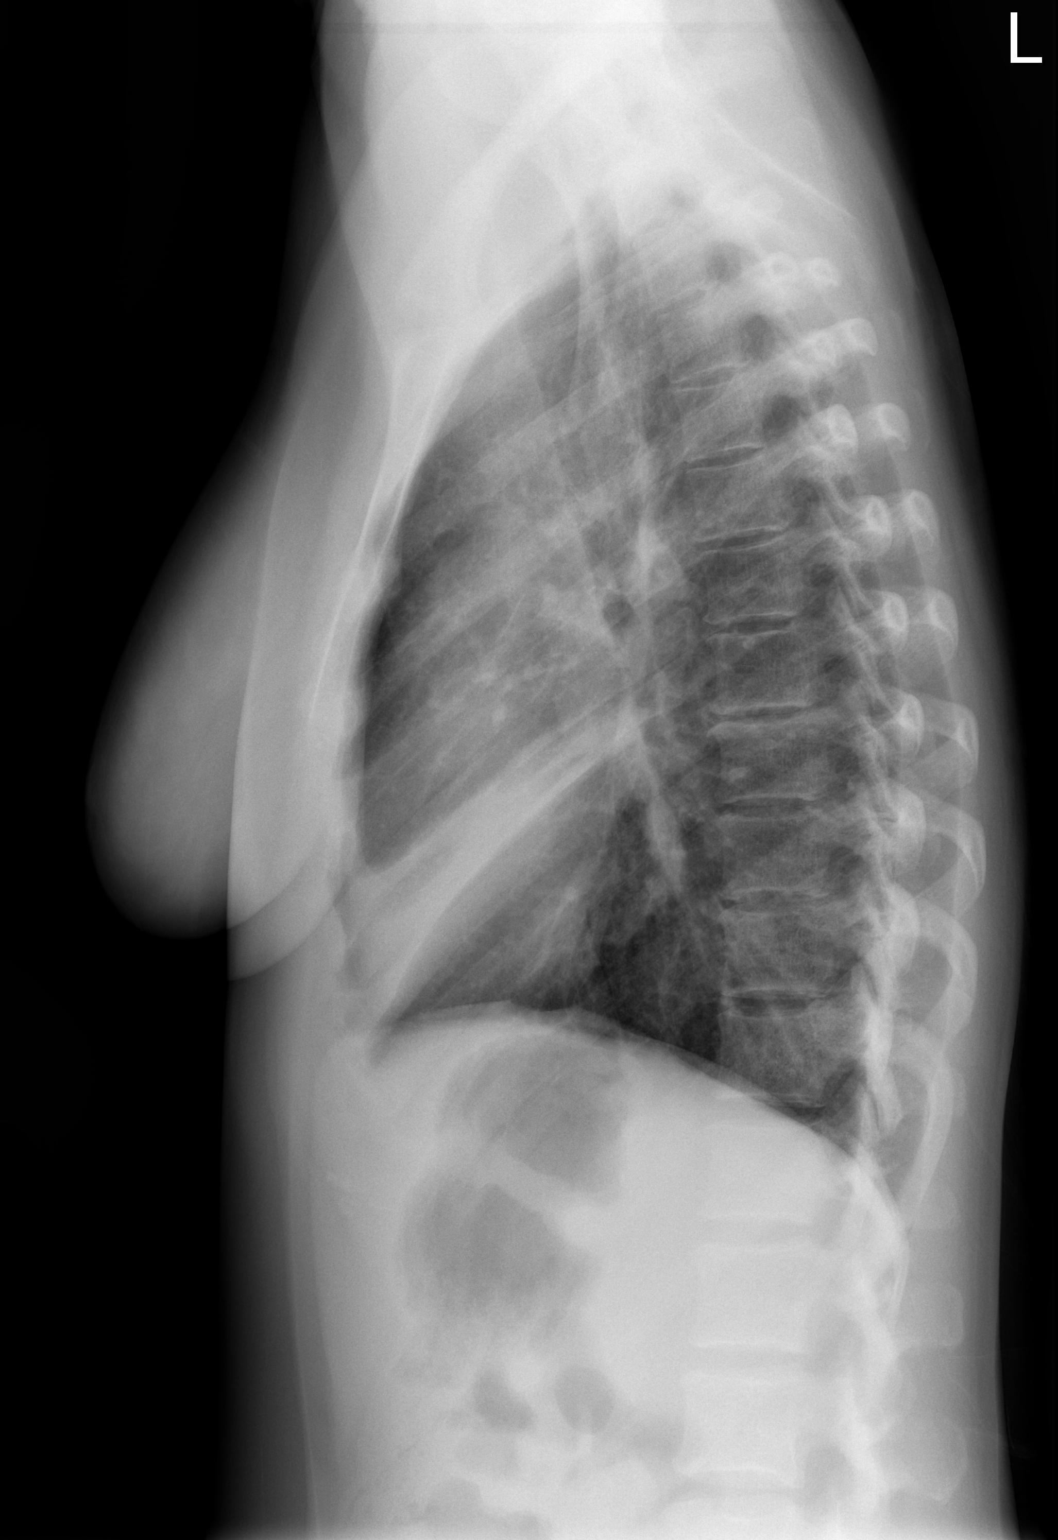

[2 of 2 positions shown; findings below may reference images not displayed]

FINDINGS: Residual consolidation in the right middle lobe persists,
slight interval improvement.  Normal heart size and vascularity.
Central bronchial thickening evident with normal lung volumes.
Negative for edema, effusion, or pneumothorax.  Trachea is midline.
IMPRESSION: Minimal improvement in right middle lobe consolidative pneumonia.

Chronic central bronchial thickening consistent with history of
reactive airways disease or asthma.

## 2013-02-08 IMAGING — CR DG CHEST 2V
2 series · 2 of 2 positions shown · non-contrast
Comparison: 10/27/2010

CLINICAL DATA: Cough.  Chest pain.

CHEST - 2 VIEW

[w chest pa]
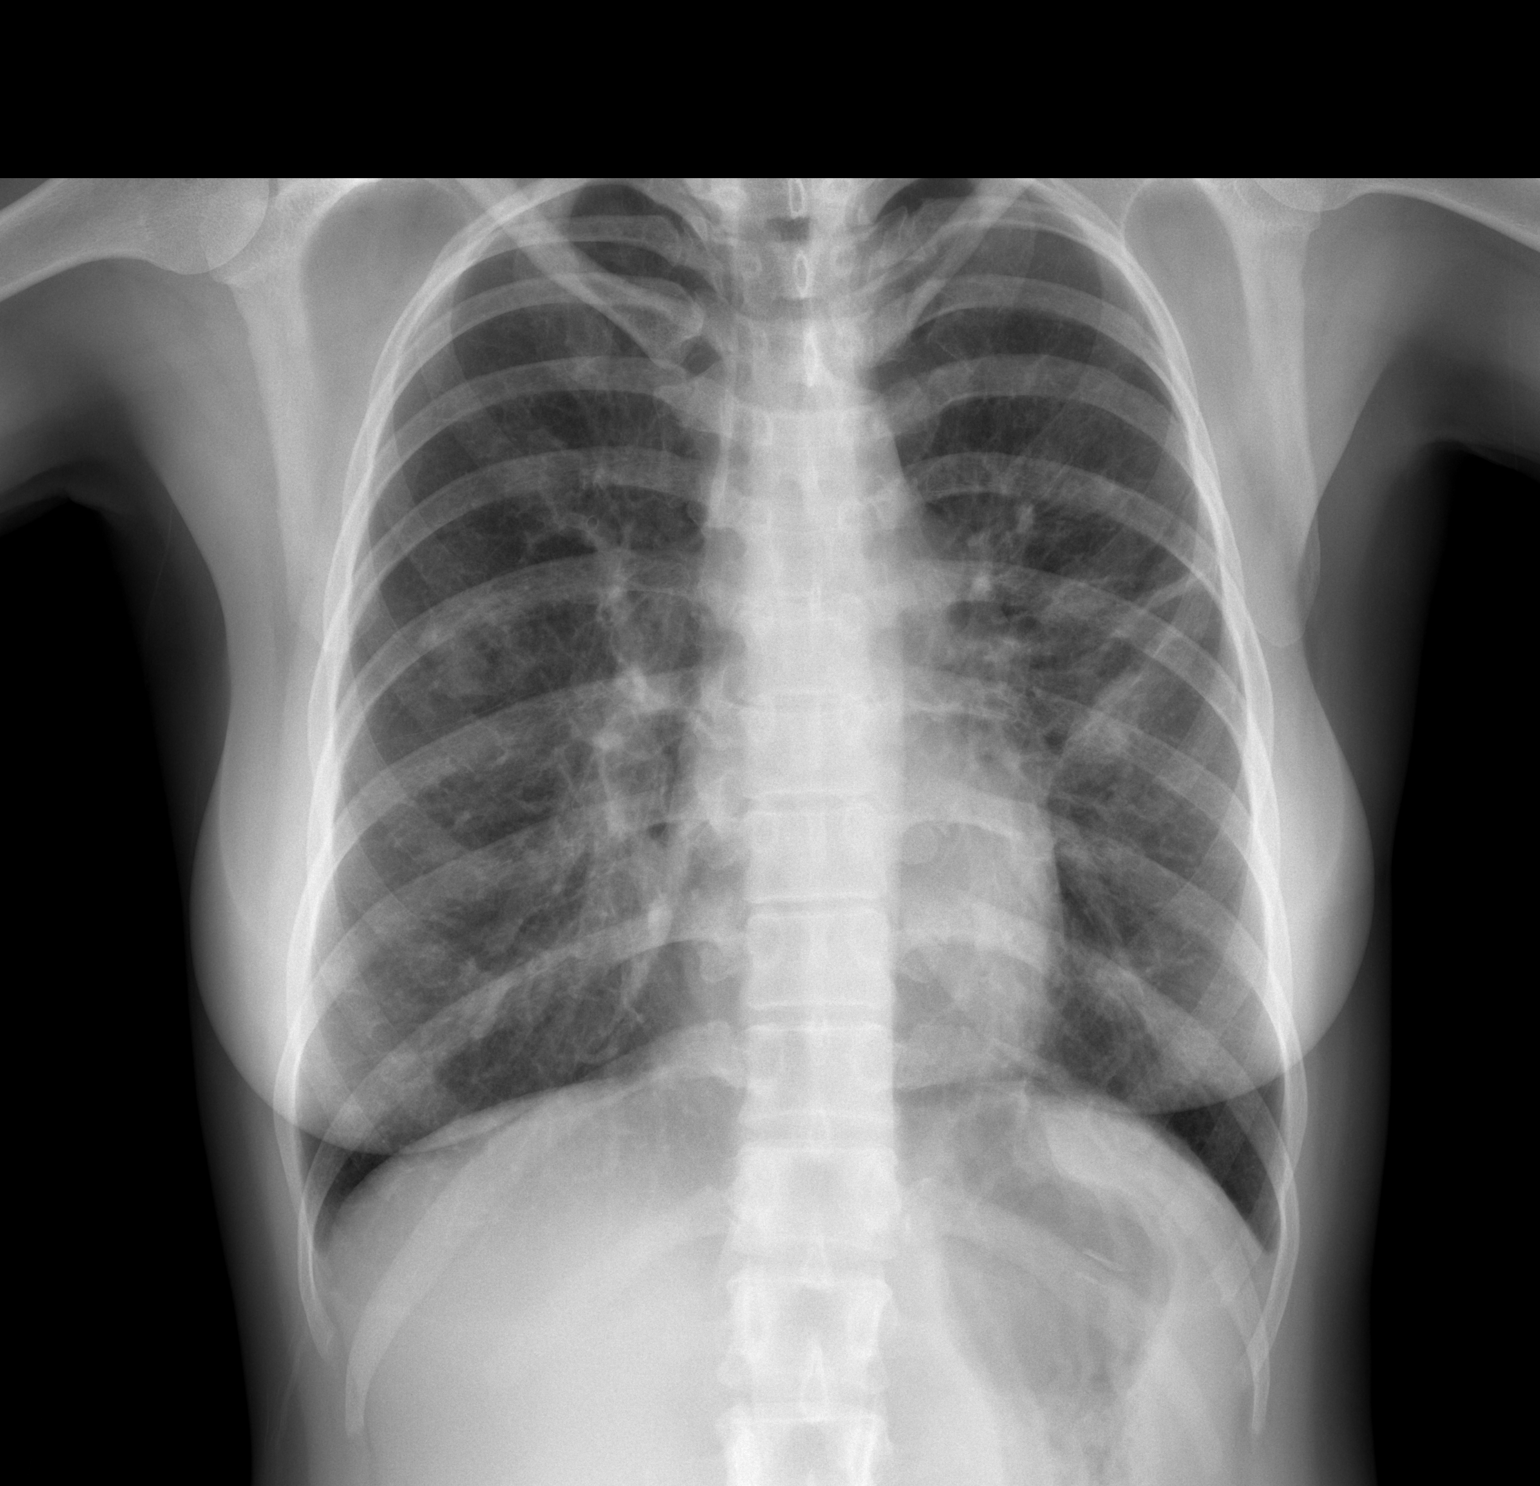

[w chest lat]
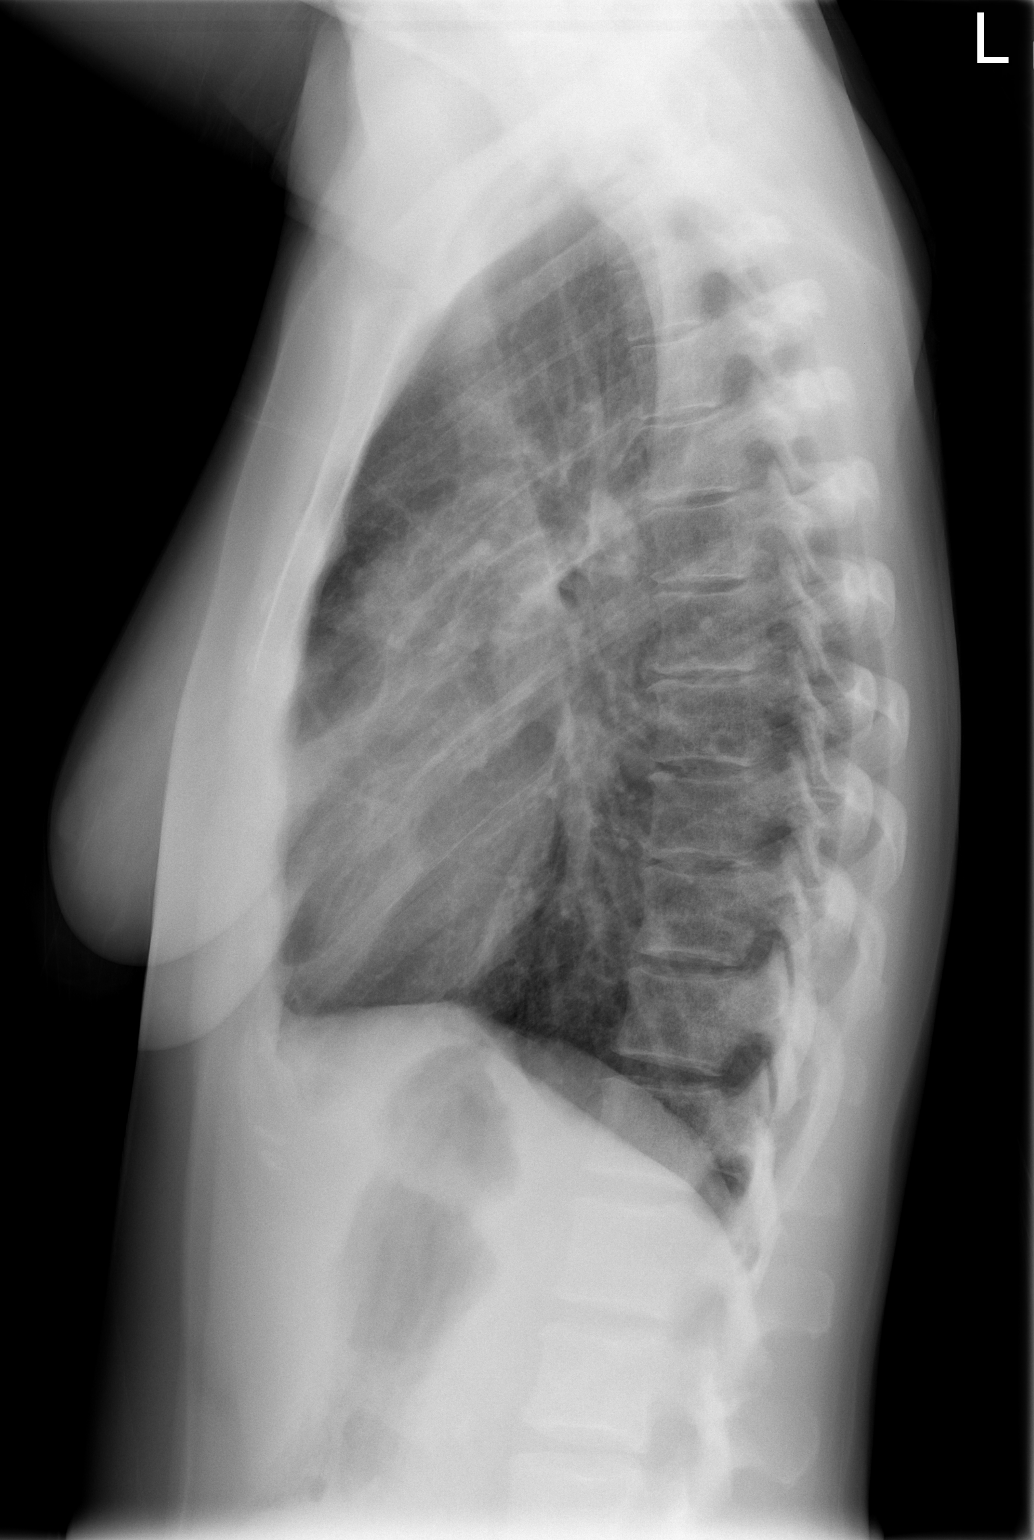

[2 of 2 positions shown; findings below may reference images not displayed]

FINDINGS: Heart size is normal.

No pleural effusions identified.

Previously noted airspace consolidation within the right middle
lobe appears improved from previous exam.

There are worsening bilateral mid lung opacities.

Coarsened interstitial markings are noted bilaterally.
IMPRESSION: 1.  Improved aeration the right middle lobe.
2.  Worsening bilateral mid lung opacities suspicious for
persistent multifocal infection.

## 2013-03-31 ENCOUNTER — Emergency Department (HOSPITAL_BASED_OUTPATIENT_CLINIC_OR_DEPARTMENT_OTHER): Payer: BC Managed Care – PPO

## 2013-03-31 ENCOUNTER — Emergency Department (HOSPITAL_BASED_OUTPATIENT_CLINIC_OR_DEPARTMENT_OTHER)
Admission: EM | Admit: 2013-03-31 | Discharge: 2013-03-31 | Disposition: A | Discharge status: Home / Self Care | Payer: BC Managed Care – PPO | Attending: Emergency Medicine | Admitting: Emergency Medicine

## 2013-03-31 ENCOUNTER — Encounter (HOSPITAL_BASED_OUTPATIENT_CLINIC_OR_DEPARTMENT_OTHER): Payer: Self-pay | Admitting: Emergency Medicine

## 2013-03-31 ENCOUNTER — Other Ambulatory Visit: Payer: Self-pay

## 2013-03-31 DIAGNOSIS — J45909 Unspecified asthma, uncomplicated: Secondary | ICD-10-CM | POA: Insufficient documentation

## 2013-03-31 DIAGNOSIS — Z9889 Other specified postprocedural states: Secondary | ICD-10-CM | POA: Insufficient documentation

## 2013-03-31 DIAGNOSIS — Z880 Allergy status to penicillin: Secondary | ICD-10-CM | POA: Insufficient documentation

## 2013-03-31 DIAGNOSIS — Z3202 Encounter for pregnancy test, result negative: Secondary | ICD-10-CM | POA: Insufficient documentation

## 2013-03-31 DIAGNOSIS — Z7982 Long term (current) use of aspirin: Secondary | ICD-10-CM | POA: Insufficient documentation

## 2013-03-31 DIAGNOSIS — K219 Gastro-esophageal reflux disease without esophagitis: Secondary | ICD-10-CM | POA: Insufficient documentation

## 2013-03-31 DIAGNOSIS — R0789 Other chest pain: Secondary | ICD-10-CM | POA: Insufficient documentation

## 2013-03-31 DIAGNOSIS — Z79899 Other long term (current) drug therapy: Secondary | ICD-10-CM | POA: Insufficient documentation

## 2013-03-31 DIAGNOSIS — F17200 Nicotine dependence, unspecified, uncomplicated: Secondary | ICD-10-CM | POA: Insufficient documentation

## 2013-03-31 HISTORY — DX: Pleurisy: R09.1

## 2013-03-31 HISTORY — DX: Gastro-esophageal reflux disease without esophagitis: K21.9

## 2013-03-31 LAB — I-STAT CHEM 8, ED
BUN: 9 mg/dL (ref 6–23)
CALCIUM ION: 1.26 mmol/L — AB (ref 1.12–1.23)
Chloride: 101 mEq/L (ref 96–112)
Creatinine, Ser: 0.8 mg/dL (ref 0.50–1.10)
GLUCOSE: 105 mg/dL — AB (ref 70–99)
HEMATOCRIT: 54 % — AB (ref 36.0–46.0)
HEMOGLOBIN: 18.4 g/dL — AB (ref 12.0–15.0)
POTASSIUM: 4 meq/L (ref 3.7–5.3)
Sodium: 140 mEq/L (ref 137–147)
TCO2: 26 mmol/L (ref 0–100)

## 2013-03-31 LAB — TROPONIN I: Troponin I: <0.3 ng/mL (ref <0.30)

## 2013-03-31 LAB — PREGNANCY, URINE: Preg Test, Ur: NEGATIVE

## 2013-03-31 MED ORDER — HYDROCODONE-ACETAMINOPHEN 5-325 MG PO TABS: 1.0000 | ORAL_TABLET | Freq: Four times a day (QID) | ORAL | Status: DC | PRN

## 2013-03-31 MED ORDER — ASPIRIN 325 MG PO TABS
325.0000 mg | ORAL_TABLET | Freq: Once | ORAL | Status: AC
  Administered 2013-03-31: 325 mg via ORAL
  Filled 2013-03-31: qty 1

## 2013-03-31 MED ORDER — MORPHINE SULFATE 4 MG/ML IJ SOLN
4.0000 mg | Freq: Once | INTRAMUSCULAR | Status: AC
  Administered 2013-03-31: 4 mg via INTRAVENOUS
  Filled 2013-03-31: qty 1

## 2013-03-31 NOTE — ED Provider Notes (Signed)
CSN: [id number]     Arrival date & time 03/31/13  1141 History   First MD Initiated Contact with Patient 03/31/13 1241     Chief Complaint  Patient presents with  . Chest Pain     (Consider location/radiation/quality/duration/timing/severity/associated sxs/prior Treatment) Patient is a 43 y.o. female presenting with chest pain. The history is provided by the patient. No language interpreter was used.  Chest Pain Pain location:  L chest Pain quality: sharp and shooting   Pain radiates to:  Mid back Pain radiates to the back: yes   Pain severity:  Severe Onset quality:  Unable to specify Duration:  4 hours Timing:  Constant Progression:  Unchanged Context: at rest   Relieved by:  Nothing Worsened by:  Nothing tried Ineffective treatments:  Rest (NSAIDs, percocet) Associated symptoms: no abdominal pain, no back pain, no cough, no diaphoresis, no fatigue, no fever, no headache, no lower extremity edema, no nausea, no numbness, no palpitations, no shortness of breath, not vomiting and no weakness   Risk factors: smoking   Risk factors: no birth control, no coronary artery disease, no diabetes mellitus, no high cholesterol, no hypertension, not female, not obese, not pregnant and no prior DVT/PE     Past Medical History  Diagnosis Date  . Asthma   . Pleurisy   . GERD (gastroesophageal reflux disease)    Past Surgical History  Procedure Laterality Date  . Appendectomy    . Cardiac surgery    . I&d extremity  10/28/2011    Procedure: IRRIGATION AND DEBRIDEMENT EXTREMITY;  Surgeon: Tennis Must, MD;  Location: [facility];  Service: Orthopedics;  Laterality: Right;  Irrigation and debridement right middle finger   No family history on file. History  Substance Use Topics  . Smoking status: Current Every Day Smoker -- 1.00 packs/day for 25 years    Types: Cigarettes  . Smokeless tobacco: Not on file  . Alcohol Use: Yes     Comment: 2 x weekly   OB History   Grav  Para Term Preterm Abortions TAB SAB Ect Mult Living                 Review of Systems  Constitutional: Negative for fever, chills, diaphoresis, activity change, appetite change and fatigue.  HENT: Negative for congestion, facial swelling, rhinorrhea and sore throat.   Eyes: Negative for photophobia and discharge.  Respiratory: Negative for cough, chest tightness and shortness of breath.   Cardiovascular: Positive for chest pain. Negative for palpitations and leg swelling.  Gastrointestinal: Negative for nausea, vomiting, abdominal pain and diarrhea.  Endocrine: Negative for polydipsia and polyuria.  Genitourinary: Negative for dysuria, frequency, difficulty urinating and pelvic pain.  Musculoskeletal: Negative for arthralgias, back pain, neck pain and neck stiffness.  Skin: Negative for color change and wound.  Allergic/Immunologic: Negative for immunocompromised state.  Neurological: Negative for facial asymmetry, weakness, numbness and headaches.  Hematological: Does not bruise/bleed easily.  Psychiatric/Behavioral: Negative for confusion and agitation.      Allergies  Erythromycin; Nitrofurantoin monohyd macro; and Penicillins  Home Medications   Current Outpatient Rx  Name  Route  Sig  Dispense  Refill  . albuterol (PROVENTIL HFA;VENTOLIN HFA) 108 (90 BASE) MCG/ACT inhaler   Inhalation   Inhale 2 puffs into the lungs every 6 (six) hours as needed. For shortness of breath          . aspirin 325 MG EC tablet   Oral   Take 325 mg by mouth  daily. For pain          . fluconazole (DIFLUCAN) 150 MG tablet   Oral   Take 1 tablet (150 mg total) by mouth once.   1 tablet   1   . HYDROcodone-acetaminophen (NORCO) 5-325 MG per tablet   Oral   Take 1 tablet by mouth every 6 (six) hours as needed.   10 tablet   0   . metroNIDAZOLE (FLAGYL) 500 MG tablet   Oral   Take 1 tablet (500 mg total) by mouth 2 (two) times daily.   14 tablet   0   . EXPIRED: omeprazole  (PRILOSEC) 20 MG capsule   Oral   Take 1 capsule (20 mg total) by mouth daily.         . vitamin C (ASCORBIC ACID) 500 MG tablet   Oral   Take 1,000 mg by mouth daily.            BP 106/71  Pulse 68  Temp(Src) 97.7 F (36.5 C) (Oral)  Resp 16  Ht 5\' 2"  (1.575 m)  Wt 106 lb (48.081 kg)  BMI 19.38 kg/m2  SpO2 100%  LMP 03/10/2013 Physical Exam  Constitutional: She is oriented to person, place, and time. She appears well-developed and well-nourished. No distress.  HENT:  Head: Normocephalic and atraumatic.  Mouth/Throat: No oropharyngeal exudate.  Eyes: Pupils are equal, round, and reactive to light.  Neck: Normal range of motion. Neck supple.  Cardiovascular: Normal rate, regular rhythm and normal heart sounds.  Exam reveals no gallop and no friction rub.   No murmur heard. Pulmonary/Chest: Effort normal and breath sounds normal. No respiratory distress. She has no wheezes. She has no rales.    Abdominal: Soft. Bowel sounds are normal. She exhibits no distension and no mass. There is no tenderness. There is no rebound and no guarding.  Musculoskeletal: Normal range of motion. She exhibits no edema and no tenderness.  Neurological: She is alert and oriented to person, place, and time.  Skin: Skin is warm and dry.  Psychiatric: She has a normal mood and affect.    ED Course  Procedures (including critical care time) Labs Review Labs Reviewed  I-STAT CHEM 8, ED - Abnormal; Notable for the following:    Glucose, Bld 105 (*)    Calcium, Ion 1.26 (*)    Hemoglobin 18.4 (*)    HCT 54.0 (*)    All other components within normal limits  PREGNANCY, URINE  TROPONIN I  TROPONIN I   Imaging Review No results found.   EKG Interpretation None      MDM   Final diagnoses:  Atypical chest pain    Pt is a 43 y.o. female with Pmhx as above who presents with a constant sharp, band like pain under L breast and radiated around to her back since waking at about 8am this  morning. No assoc n/v, diaphoresis, SOB, leg pain, swelling, cough fever, rash.  She took NSAIDs and percocet w/o relief. Hx of pleurisy in the past. Risk factors for ACS and PE are smoking. On PE, VSS, pt in NAD. +tenderness of chest wall. No skin changes. Cardiopulm exam otherwise benign. Likely track marks on L hand. EKG w/o acute ischemic changes.    Pt feeling much improved after IM morphine. Delta trop about 8 hrs after CP onset negative. CXR unremarkable. Doubt ACS.  She is low-risk for MACE with score of 2 even if hx considered moderately suspicious. Will d/c home w/  plan to return for continued symptoms. Have also suggested she establish with a local PCP.      Neta Ehlers, MD 04/02/13 [phone]

## 2013-03-31 NOTE — ED Notes (Signed)
Patient transported to X-ray 

## 2013-03-31 NOTE — Discharge Instructions (Signed)
Chest Pain (Nonspecific) Chest pain has many causes. Your pain could be caused by something serious, such as a heart attack or a blood clot in the lungs. It could also be caused by something less serious, such as a chest bruise or a virus. Follow up with your doctor. More lab tests or other studies may be needed to find the cause of your pain. Most of the time, nonspecific chest pain will improve within 2 to 3 days of rest and mild pain medicine. HOME CARE  For chest bruises, you may put ice on the sore area for 15-20 minutes, 03-04 times a day. Do this only if it makes you feel better.  Put ice in a plastic bag.  Place a towel between the skin and the bag.  Rest for the next 2 to 3 days.  Go back to work if the pain improves.  See your doctor if the pain lasts longer than 1 to 2 weeks.  Only take medicine as told by your doctor.  Quit smoking if you smoke. GET HELP RIGHT AWAY IF:   There is more pain or pain that spreads to the arm, neck, jaw, back, or belly (abdomen).  You have shortness of breath.  You cough more than usual or cough up blood.  You have very bad back or belly pain, feel sick to your stomach (nauseous), or throw up (vomit).  You have very bad weakness.  You pass out (faint).  You have a fever. Any of these problems may be serious and may be an emergency. Do not wait to see if the problems will go away. Get medical help right away. Call your local emergency services 911 in [place] Do not drive yourself to the hospital. MAKE SURE YOU:   Understand these instructions.  Will watch this condition.  Will get help right away if you or your child is not doing well or gets worse. Document Released: 06/25/2007 Document Revised: 03/31/2011 Document Reviewed: 06/25/2007 [facility] Patient Information 2014 [place], [place].   Emergency Department Resource Guide 1) Find a Doctor and Pay Out of Pocket Although you won't have to find out who is covered by your insurance  plan, it is a good idea to ask around and get recommendations. You will then need to call the office and see if the doctor you have chosen will accept you as a new patient and what types of options they offer for patients who are self-pay. Some doctors offer discounts or will set up payment plans for their patients who do not have insurance, but you will need to ask so you aren't surprised when you get to your appointment.  2) Contact Your Local Health Department Not all health departments have doctors that can see patients for sick visits, but many do, so it is worth a call to see if yours does. If you don't know where your local health department is, you can check in your phone book. The CDC also has a tool to help you locate your state's health department, and many state websites also have listings of all of their local health departments.  3) Find a Walk-in Clinic If your illness is not likely to be very severe or complicated, you may want to try a walk in clinic. These are popping up all over the country in pharmacies, drugstores, and shopping centers. They're usually staffed by nurse practitioners or physician assistants that have been trained to treat common illnesses and complaints. They're usually fairly quick and inexpensive. However,  if you have serious medical issues or chronic medical problems, these are probably not your best option.  No Primary Care Doctor: - Call Health Connect at  [phone] - they can help you locate a primary care doctor that  accepts your insurance, provides certain services, etc. - Physician Referral Service- [phone]  Chronic Pain Problems: Organization         Address  Phone   Notes  [facility]  [phone]) [phone] Patients need to be referred by their primary care doctor.   Medication Assistance: Organization         Address  Phone   Notes  [facility] Medication [facility] [address] [place], [place] [address]  [phone](336) [phone] --Must be a resident of [place] -- Must have NO insurance coverage whatsoever (no Medicaid/ Medicare, etc.) -- The pt. MUST have a primary care doctor that directs their care regularly and follows them in the community   MedAssist  [phone]) [phone]   [employer]  [phone]) [phone]    Agencies that provide inexpensive medical care: Organization         Address  Phone   Notes  [facility]  [phone]   [facility]    [phone]   [facility] [address] [place], [place] [address] [phone]) [phone]   [facility] [address], [place] [phone]) [phone]   Planned Parenthood    [phone]   [facility]    [phone](336) [phone]   [facility]  [address], [place] Phone:  [phone](336) [phone], Fax:  [phone]) [phone] Hours of Operation:  9 am - 6 pm, M-F.  Also accepts Medicaid/Medicare and self-pay.  [facility]  [address],  Phone: [phone](336) [phone], Fax: [phone]) [phone]. Hours of Operation:  8:30 am - 5:30 pm, M-F.  Also accepts Medicaid and self-pay.  [facility] [place] [address], [place] Phone: [phone](336) [phone]   [facility] [address], [place], [place] [phone], Ext. [phone] Mondays & Thursdays: 7-9 AM.  First 15 patients are seen on a first come, first serve basis.    Medicaid-accepting [facility] Providers:  Organization         Address  Phone   Notes  [facility] [address],  [phone] Also accepts self-pay patients.  [facility] [address], [place]  [phone](336) [phone]   [facility] [address], [place] [phone]) [phone]   [facility] [address] [place] [address], [place] [phone]) [phone]   Renaye RakersVeita Bland [address], [place]   [phone])  [phone] Only accepts [place] Access [place] patients after they have their name applied to their card.   Self-Pay (no insurance) in [facility] [place]:  Organization         Address  Phone   Notes  Sickle Cell Patients, [facility] [address], [place] [phone]) [phone]   [facility] [address], [place] [phone]) [phone]   [facility] [place]  [address] [place] [address], [place] [phone]   [facility]/Dr. Osei-Bonsu  [address] [place] [address], [place] or [phone], [place] [phone] Phone number for both [facility]  and [place] locations is the same.  [facility] [address] [place] [phone]   [facility] [address], [place] or [address] [phone]   [facility] [address], [place] [phone], phone; [phone], fax Sees patients 1st and 3rd Saturday of every month.  Must not qualify for public or private insurance (i.e. Medicaid, Medicare, [place] Health Choice, Veterans' Benefits)  Household income should be no more than 200% of the poverty level The clinic cannot treat you if you are pregnant or think you are pregnant  Sexually transmitted diseases are not treated at the clinic.    Dental Care: Organization         Address  Phone  Notes  [facility] [place] [phone] Accepts children up to age 61 who are enrolled in [place] or [place]; pregnant women with a Medicaid card; and children who have applied for Medicaid or [place] Health Choice, but were declined, whose parents can pay a reduced fee at time of service.  [facility] Department of [facility]  [address], [place] [phone] Accepts children up to age 7 who are enrolled in [place] or [place]; pregnant women with a Medicaid card; and children who have applied for Medicaid or [place] Health Choice, but were declined, whose parents can pay a reduced fee at time of service.  [place] Adult Dental Access PROGRAM  [place] [phone] Patients are seen by appointment only. Walk-ins are not accepted. [place] will see patients 60 years of age and older. Monday - Tuesday (8am-5pm) Most Wednesdays (8:30-5pm) $30 per visit, cash only  [facility] Adult Dental Access PROGRAM  [address], [facility] [phone] Patients are seen by appointment only. Walk-ins are not accepted. [place] will see patients 45 years of age and older. One Wednesday Evening (Monthly: Volunteer Based).  $30 per visit, cash only  [place]  [phone] for adults; Children under age 19, call Graduate Pediatric Dentistry at [phone]. Children aged 55-14, please call [phone] to request a pediatric application.  Dental services are provided in all areas of dental care including fillings, crowns and bridges, complete and partial dentures, implants, gum treatment, root canals, and extractions. Preventive care is also provided. Treatment is provided to both adults and children. Patients are selected via a lottery and there is often a waiting list.   [facility] [address], [place]  [phone] [email or web address]   [facility] [address] [place], [place] [phone], Ext. [phone] Second and Fourth Thursday of each month, opens at 6:30 AM; Clinic ends at 9 AM.  Patients are seen on a first-come first-served basis, and a limited number are seen during each clinic.   [facility]  [address] Hillard Danker [place], [place] [phone]   Eligibility Requirements You must have lived in [place], [place], or [place] counties for at least the last three months.   You cannot be eligible for state or  federal sponsored [employer], including [employer], [place], or [employer].   You generally cannot be eligible for healthcare insurance through your employer.    How to apply: Eligibility screenings are held every Tuesday and Wednesday afternoon from 1:00 pm until 4:00 pm. You do  not need an appointment for the interview!  [facility] [address] [place], [place]   [place]  [place] Department  [place]  [phone]    Behavioral Health Resources in the Community: Intensive Outpatient Programs Organization         Address  Phone  Notes  [place]. [address] [place], [place] [phone]   [facility] Outpatient [address] [place], [place]   ADS: Alcohol & Drug Svcs [address] [place], [place]   [place] [address]  [place], [place] or [phone]   Substance Abuse Resources Organization         Address  Phone  Notes  Alcohol and Drug Services  [phone]   [place]  [phone]   The [place]   Chinita Pester  [phone]   Residential & Outpatient Substance Abuse Program  [phone]   Psychological Services Organization         Address  Phone  Notes  [facility] [place]  [place]  [phone]   [place] [address], [place] or [phone]    Mobile Crisis Teams Organization         Address  Phone  Notes  [facility], Mobile Crisis Care Unit  [phone]   [facility]  [address] [place], [place]   [place] [address] [place] [phone]    Self-Help/Support Groups Organization         Address  Phone              Notes  [place]. of [place] - variety of support groups  [place] Call for more information  Narcotics Anonymous (NA), [facility] [address], [employer] [place]  2 meetings at this location   Special educational needs teacher         Address  Phone  Notes  [facility]idential [facility] [place],    [place]  [phone]   [facility]  [address], [place] [address]9924, [place], [place]   [place], [place] [phone] Admissions: 8am-3pm M-F  Incentives Substance [place] [address]    [place], [place] [phone]   [facility] [address] [place], [place], [place]   [facility] [address]  [place], [place]   [facility] Programs - Intensive Outpatient [address], [place], [place]   [facility] [place].,  [place], [place] [phone] or [phone]   [facility] Services (RTS) [address] [place], [place] Accepts Medicaid  [facility] [address],  [place] [phone] Substance Abuse/Addiction Treatment   [facility] Adult [facility] Health Services Organization         Address  Phone  Notes  [facility]  [phone]   Domenic Schwab, PhD [address] Arlis [place], [place]   [phone] or [phone]   [place], [place] [phone]   [place] [address], [place], [place] [phone] Insurance/Medicaid/sponsorship through [employer] and Families [address] GGE 366  [place], [place] [phone] [place], [place] [phone]    Dr. Adele Schilder  [phone]   [facility] [place]  Dept. 1) [address] [place] 2) [place] 3)  [address] [place] [address], [place] [phone]  [phone]   [place] [phone] or [phone] (After Hours)

## 2013-03-31 NOTE — ED Notes (Signed)
Developed chest pain described as constant, "intense" crushing pain radiating to left upper back and under left breast.

## 2013-04-23 IMAGING — US US SOFT TISSUE HEAD/NECK
1 series · 14 of 25 positions shown · non-contrast
Comparison: None.

CLINICAL DATA: Diffuse thyroid enlargement

THYROID ULTRASOUND
TECHNIQUE: Ultrasound examination of the thyroid gland and adjacent
soft tissues was performed.

[Series 1: us soft tissue head/neck · 0.05mm/px · 14 of 46 slices shown]
[im 1/46]
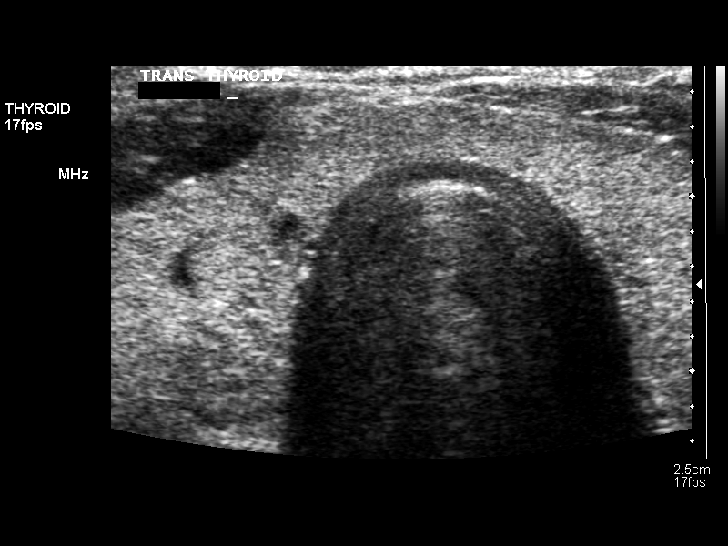
[im 4/46]
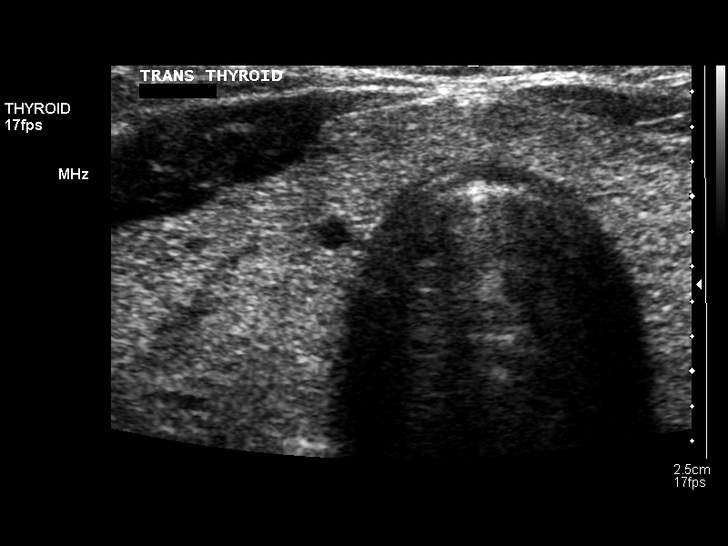
[im 8/46]
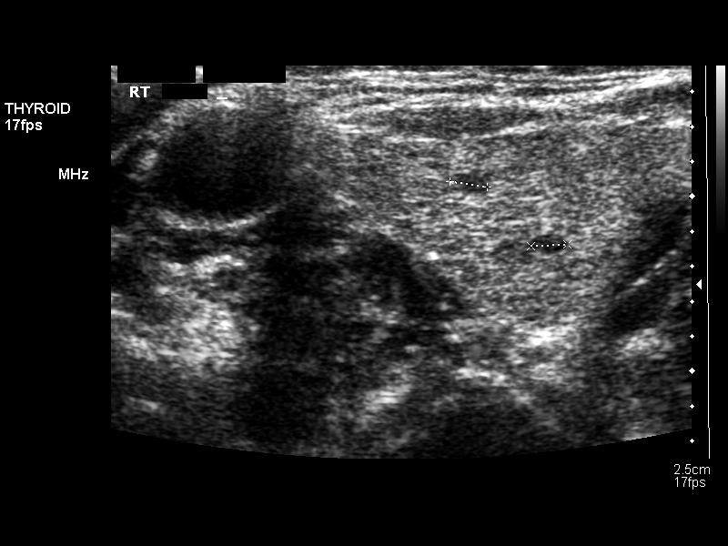
[im 12/46]
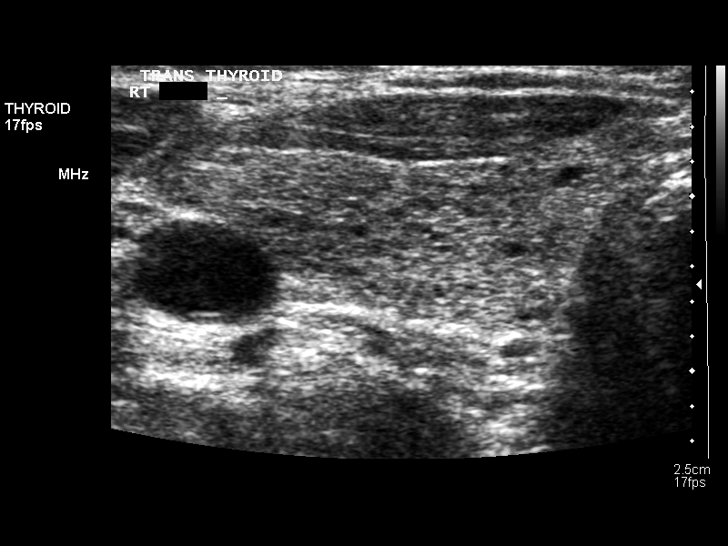
[im 16/46]
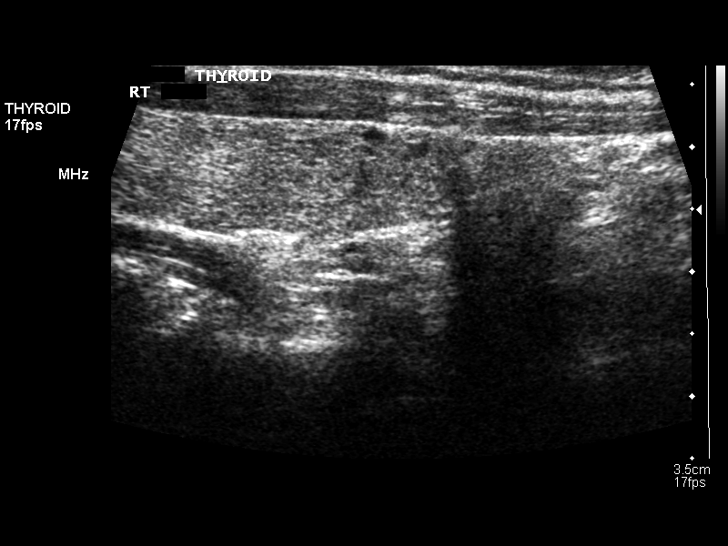
[im 17/46]
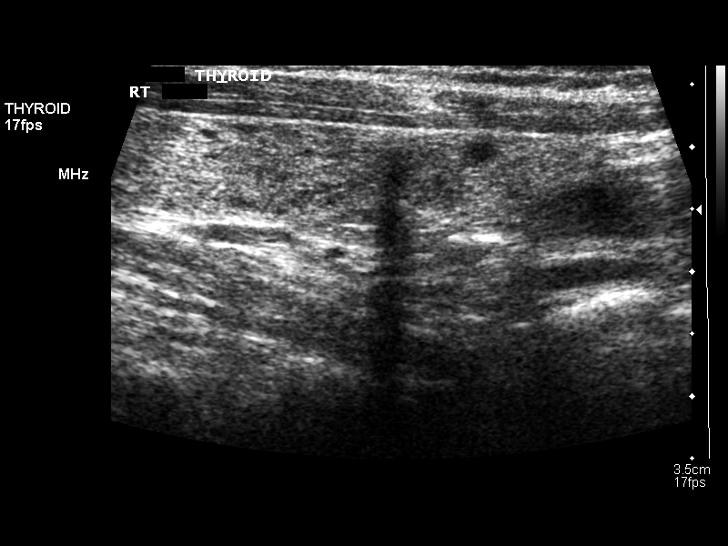
[im 21/46]
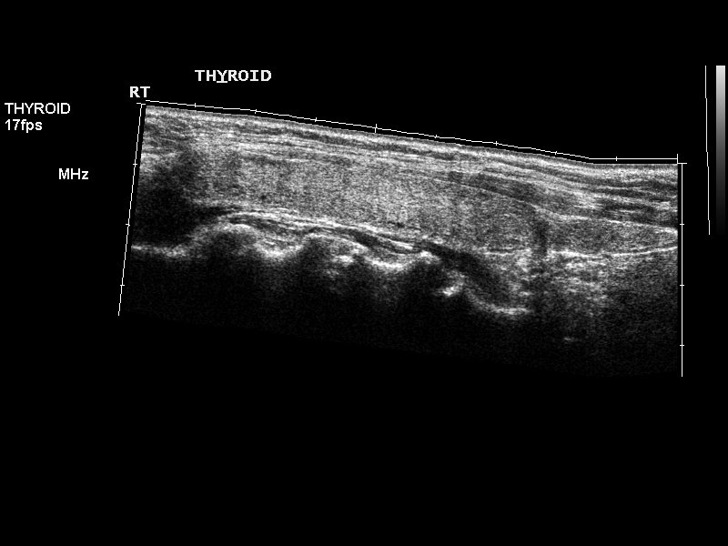
[im 25/46]
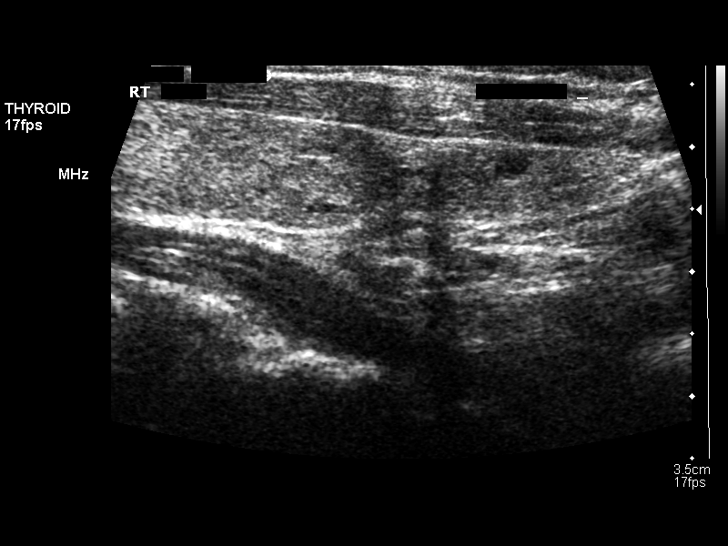
[im 29/46]
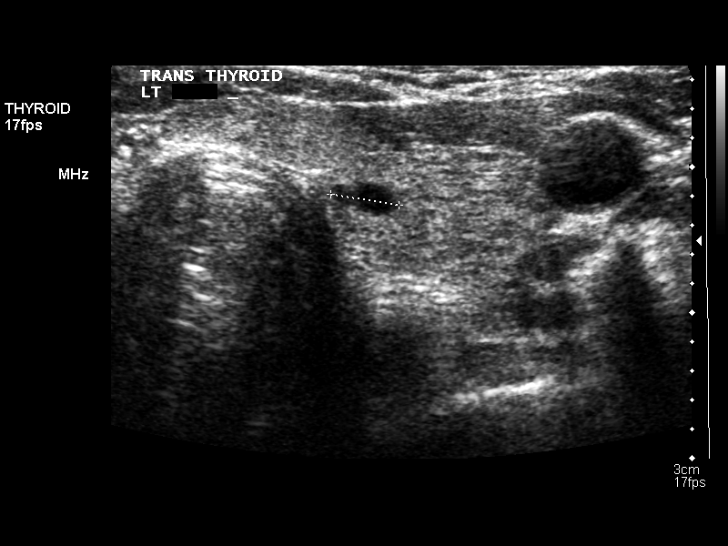
[im 31/46]
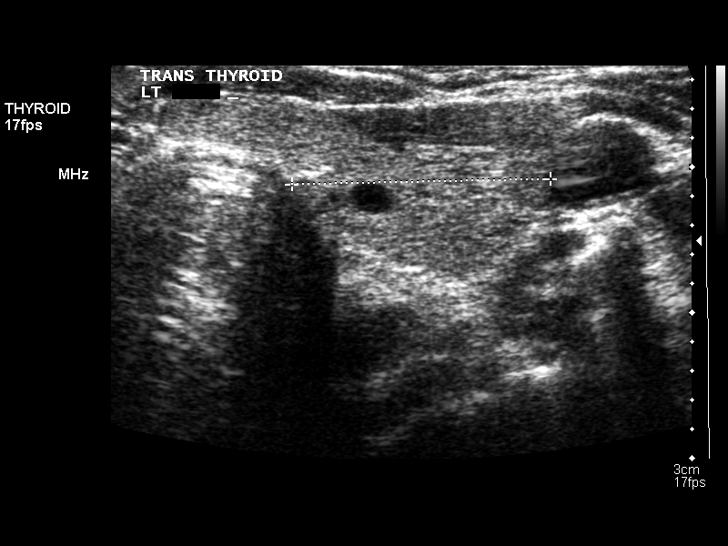
[im 34/46]
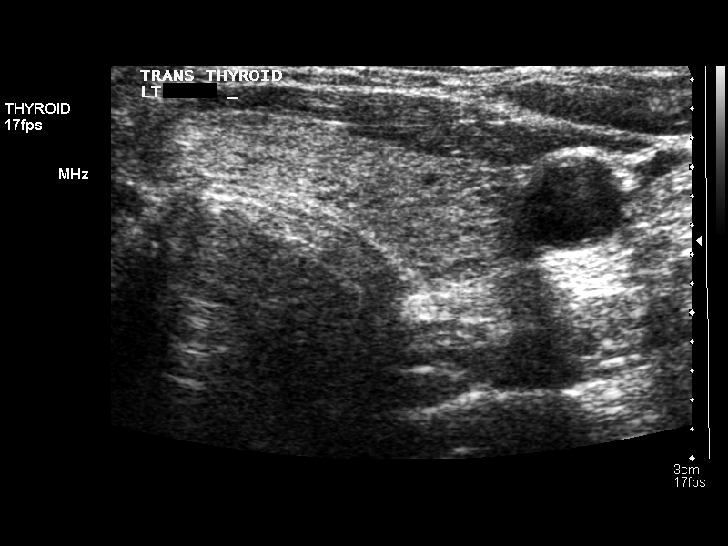
[im 38/46]
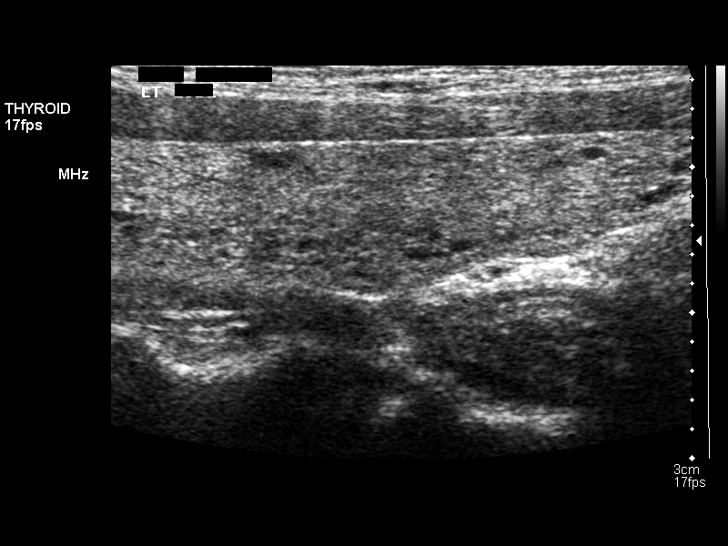
[im 42/46]
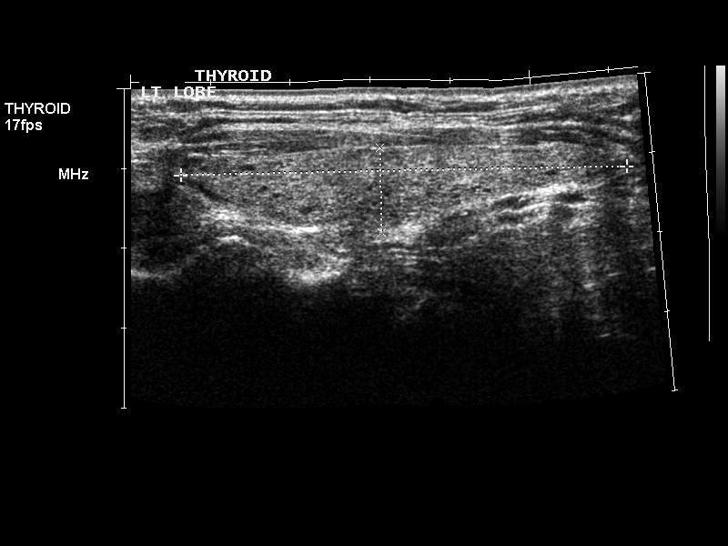
[im 46/46]
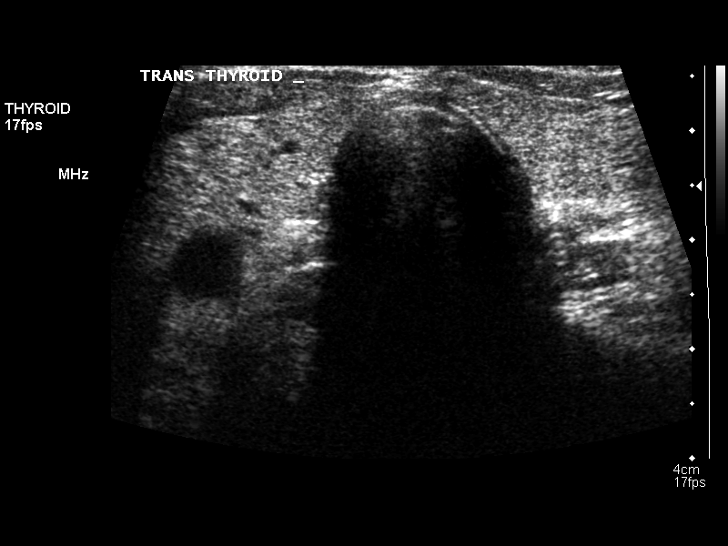

[14 of 25 positions shown; findings below may reference images not displayed]

FINDINGS: Right thyroid lobe:  8.4 x 1.2 x 2.6 cm.
Left thyroid lobe:  5.6 x 1.1 x 1.8 cm.
Isthmus:  4 mm in thickness.

Focal nodules:  The echogenicity of the thyroid gland is diffusely
inhomogeneous.  Only tiny hypoechoic nodules are scattered
throughout both lobes of 5 mm or less in size.  The right lobe
appears to extend substernally.

Lymphadenopathy:  None visualized.
IMPRESSION: Enlarged inhomogeneous thyroid consistent with goiter.  Only small
hypoechoic nodules are present of 5 mm or less in size bilaterally.

## 2013-09-16 ENCOUNTER — Emergency Department (HOSPITAL_BASED_OUTPATIENT_CLINIC_OR_DEPARTMENT_OTHER): Payer: BC Managed Care – PPO

## 2013-09-16 ENCOUNTER — Emergency Department (HOSPITAL_BASED_OUTPATIENT_CLINIC_OR_DEPARTMENT_OTHER): Payer: Self-pay

## 2013-09-16 ENCOUNTER — Emergency Department (HOSPITAL_BASED_OUTPATIENT_CLINIC_OR_DEPARTMENT_OTHER)
Admission: EM | Admit: 2013-09-16 | Discharge: 2013-09-16 | Disposition: A | Discharge status: Home / Self Care | Payer: BC Managed Care – PPO | Attending: Emergency Medicine | Admitting: Emergency Medicine

## 2013-09-16 ENCOUNTER — Encounter (HOSPITAL_BASED_OUTPATIENT_CLINIC_OR_DEPARTMENT_OTHER): Payer: Self-pay | Admitting: Emergency Medicine

## 2013-09-16 DIAGNOSIS — Z880 Allergy status to penicillin: Secondary | ICD-10-CM | POA: Insufficient documentation

## 2013-09-16 DIAGNOSIS — F17200 Nicotine dependence, unspecified, uncomplicated: Secondary | ICD-10-CM | POA: Insufficient documentation

## 2013-09-16 DIAGNOSIS — J159 Unspecified bacterial pneumonia: Secondary | ICD-10-CM | POA: Insufficient documentation

## 2013-09-16 DIAGNOSIS — M546 Pain in thoracic spine: Secondary | ICD-10-CM | POA: Insufficient documentation

## 2013-09-16 DIAGNOSIS — K219 Gastro-esophageal reflux disease without esophagitis: Secondary | ICD-10-CM | POA: Insufficient documentation

## 2013-09-16 DIAGNOSIS — J189 Pneumonia, unspecified organism: Secondary | ICD-10-CM

## 2013-09-16 DIAGNOSIS — Z7982 Long term (current) use of aspirin: Secondary | ICD-10-CM | POA: Insufficient documentation

## 2013-09-16 DIAGNOSIS — J45909 Unspecified asthma, uncomplicated: Secondary | ICD-10-CM | POA: Insufficient documentation

## 2013-09-16 DIAGNOSIS — Z79899 Other long term (current) drug therapy: Secondary | ICD-10-CM | POA: Insufficient documentation

## 2013-09-16 LAB — COMPREHENSIVE METABOLIC PANEL
ALBUMIN: 3.4 g/dL — AB (ref 3.5–5.2)
ALT: 11 U/L (ref 0–35)
ANION GAP: 15 (ref 5–15)
AST: 18 U/L (ref 0–37)
Alkaline Phosphatase: 67 U/L (ref 39–117)
BILIRUBIN TOTAL: 0.7 mg/dL (ref 0.3–1.2)
BUN: 6 mg/dL (ref 6–23)
CALCIUM: 9.5 mg/dL (ref 8.4–10.5)
CO2: 24 mEq/L (ref 19–32)
CREATININE: 0.6 mg/dL (ref 0.50–1.10)
Chloride: 100 mEq/L (ref 96–112)
GFR calc Af Amer: >90 mL/min (ref >90)
Glucose, Bld: 99 mg/dL (ref 70–99)
Potassium: 4.1 mEq/L (ref 3.7–5.3)
Sodium: 139 mEq/L (ref 137–147)
Total Protein: 7.4 g/dL (ref 6.0–8.3)

## 2013-09-16 LAB — CBC WITH DIFFERENTIAL/PLATELET
BASOS ABS: 0 10*3/uL (ref 0.0–0.1)
BASOS PCT: 0 % (ref 0–1)
EOS ABS: 0.1 10*3/uL (ref 0.0–0.7)
EOS PCT: 0 % (ref 0–5)
HEMATOCRIT: 42.8 % (ref 36.0–46.0)
HEMOGLOBIN: 15 g/dL (ref 12.0–15.0)
Lymphocytes Relative: 9 % — ABNORMAL LOW (ref 12–46)
Lymphs Abs: 1.8 10*3/uL (ref 0.7–4.0)
MCH: 35.1 pg — AB (ref 26.0–34.0)
MCHC: 35 g/dL (ref 30.0–36.0)
MCV: 100.2 fL — AB (ref 78.0–100.0)
MONO ABS: 1.5 10*3/uL — AB (ref 0.1–1.0)
MONOS PCT: 8 % (ref 3–12)
Neutro Abs: 15.3 10*3/uL — ABNORMAL HIGH (ref 1.7–7.7)
Neutrophils Relative %: 83 % — ABNORMAL HIGH (ref 43–77)
Platelets: 255 10*3/uL (ref 150–400)
RBC: 4.27 MIL/uL (ref 3.87–5.11)
RDW: 12.6 % (ref 11.5–15.5)
WBC: 18.7 10*3/uL — ABNORMAL HIGH (ref 4.0–10.5)

## 2013-09-16 MED ORDER — IOHEXOL 300 MG/ML  SOLN
80.0000 mL | Freq: Once | INTRAMUSCULAR | Status: AC | PRN
  Administered 2013-09-16: 80 mL via INTRAVENOUS

## 2013-09-16 MED ORDER — AZITHROMYCIN 250 MG PO TABS: 500.0000 mg | ORAL_TABLET | Freq: Once | ORAL | Status: DC

## 2013-09-16 MED ORDER — AZITHROMYCIN 250 MG PO TABS: 250.0000 mg | ORAL_TABLET | Freq: Every day | ORAL | Status: DC

## 2013-09-16 MED ORDER — DEXTROSE 5 % IV SOLN: 1.0000 g | Freq: Once | INTRAVENOUS | Status: DC

## 2013-09-16 MED ORDER — CEFTRIAXONE SODIUM 1 G IJ SOLR
INTRAMUSCULAR | Status: AC
  Administered 2013-09-16: 1000 mg via INTRAVENOUS
  Filled 2013-09-16: qty 10

## 2013-09-16 MED ORDER — ONDANSETRON HCL 4 MG/2ML IJ SOLN
4.0000 mg | Freq: Once | INTRAMUSCULAR | Status: AC
  Administered 2013-09-16: 4 mg via INTRAVENOUS
  Filled 2013-09-16: qty 2

## 2013-09-16 MED ORDER — HYDROCODONE-ACETAMINOPHEN 5-325 MG PO TABS: 2.0000 | ORAL_TABLET | ORAL | Status: DC | PRN

## 2013-09-16 MED ORDER — HYDROMORPHONE HCL PF 1 MG/ML IJ SOLN
1.0000 mg | Freq: Once | INTRAMUSCULAR | Status: AC
  Administered 2013-09-16: 1 mg via INTRAVENOUS
  Filled 2013-09-16: qty 1

## 2013-09-16 NOTE — ED Provider Notes (Signed)
CSN: [id number]     Arrival date & time 09/16/13  1120 History   First MD Initiated Contact with Patient 09/16/13 1212     Chief Complaint  Patient presents with  . Back Pain     (Consider location/radiation/quality/duration/timing/severity/associated sxs/prior Treatment) Patient is a 43 y.o. female presenting with back pain. The history is provided by the patient. No language interpreter was used.  Back Pain Location:  Thoracic spine Quality:  Aching Radiates to:  Does not radiate Pain severity:  Severe Pain is:  Worse during the day Onset quality:  Gradual Timing:  Constant Progression:  Worsening Chronicity:  New Context: recent illness   Relieved by:  Nothing Worsened by:  Coughing Ineffective treatments:  None tried Associated symptoms: weight loss   Associated symptoms: no chest pain and no paresthesias   Risk factors: no vascular disease     Past Medical History  Diagnosis Date  . Asthma   . Pleurisy   . GERD (gastroesophageal reflux disease)    Past Surgical History  Procedure Laterality Date  . Appendectomy    . Cardiac surgery    . I&d extremity  10/28/2011    Procedure: IRRIGATION AND DEBRIDEMENT EXTREMITY;  Surgeon: Tennis Must, MD;  Location: [place];  Service: Orthopedics;  Laterality: Right;  Irrigation and debridement right middle finger   History reviewed. No pertinent family history. History  Substance Use Topics  . Smoking status: Current Every Day Smoker -- 1.00 packs/day for 25 years    Types: Cigarettes  . Smokeless tobacco: Not on file  . Alcohol Use: Yes     Comment: 2 x weekly   OB History   Grav Para Term Preterm Abortions TAB SAB Ect Mult Living                 Review of Systems  Constitutional: Positive for weight loss.  Cardiovascular: Negative for chest pain.  Musculoskeletal: Positive for back pain.  Neurological: Negative for paresthesias.  All other systems reviewed and are negative.     Allergies   Erythromycin; Nitrofurantoin monohyd macro; and Penicillins  Home Medications   Prior to Admission medications   Medication Sig Start Date End Date Taking? Authorizing Provider  albuterol (PROVENTIL HFA;VENTOLIN HFA) 108 (90 BASE) MCG/ACT inhaler Inhale 2 puffs into the lungs every 6 (six) hours as needed. For shortness of breath     Historical Provider, MD  aspirin 325 MG EC tablet Take 325 mg by mouth daily. For pain     Historical Provider, MD  fluconazole (DIFLUCAN) 150 MG tablet Take 1 tablet (150 mg total) by mouth once. 08/30/12   Gregor Hams, MD  HYDROcodone-acetaminophen (NORCO) 5-325 MG per tablet Take 1 tablet by mouth every 6 (six) hours as needed. 03/31/13   Ernestina Patches, MD  metroNIDAZOLE (FLAGYL) 500 MG tablet Take 1 tablet (500 mg total) by mouth 2 (two) times daily. 08/30/12   Gregor Hams, MD  omeprazole (PRILOSEC) 20 MG capsule Take 1 capsule (20 mg total) by mouth daily. 09/13/10 05/09/12  Karen Chafe Molpus, MD  vitamin C (ASCORBIC ACID) 500 MG tablet Take 1,000 mg by mouth daily.      Historical Provider, MD   BP 145/93  Pulse 93  Temp(Src) 98 F (36.7 C) (Oral)  Resp 16  Ht 5' 2.5" (1.588 m)  Wt 103 lb (46.72 kg)  BMI 18.53 kg/m2  SpO2 97%  LMP 08/12/2013 Physical Exam  Nursing note and vitals reviewed. Constitutional: She  is oriented to person, place, and time. She appears well-developed and well-nourished.  HENT:  Head: Normocephalic.  Eyes: EOM are normal. Pupils are equal, round, and reactive to light.  Neck: Normal range of motion.  Cardiovascular: Normal rate and normal heart sounds.   Pulmonary/Chest: Effort normal.  Abdominal: Soft. She exhibits no distension.  Musculoskeletal: Normal range of motion.  Neurological: She is alert and oriented to person, place, and time.  Skin: Skin is warm.  Psychiatric: She has a normal mood and affect.    ED Course  Procedures (including critical care time) Labs Review Labs Reviewed - No data to display  Imaging  Review Dg Cervical Spine Complete  09/16/2013   CLINICAL DATA:  Chronic neck pain  EXAM: CERVICAL SPINE  4+ VIEWS  COMPARISON:  None.  FINDINGS: Seven cervical segments are well visualized. Vertebral body height is well maintained. Osteophytic changes are noted from C3 to C6. The neural foramina are widely patent. No acute fracture or acute facet abnormality is noted. Chronic calcifications are noted on the left. The odontoid is within normal limits.  IMPRESSION: Mild degenerative changes without acute abnormality.   Electronically Signed   By: Inez Catalina M.D.   On: 09/16/2013 12:41   Dg Thoracic Spine 2 View  09/16/2013   CLINICAL DATA:  Pain.  EXAM: THORACIC SPINE - 2 VIEW  COMPARISON:  Chest x-ray 03/31/2013.  FINDINGS: Paraspinal soft tissues are normal. No acute bony abnormality. Normal bony mineralization and alignment. Mild left mid lung atelectasis and/or infiltrate cannot be excluded. Mild right lung base subsegmental atelectasis versus infiltrate. PA and lateral chest x-ray suggested for further evaluation.  IMPRESSION: 1. No focal abnormality noted thoracic spine. 2. Mild left mid lung field and right lower lobe atelectasis and/or infiltrates. PA and lateral chest x-ray suggested for further evaluation. a   Electronically Signed   By: [place]   On: 09/16/2013 12:41     EKG Interpretation None      MDM c spine and t spine  No bony abnormality.   Radiologist advised chest xray,  Chest xray shows abnormality with Ct recommended.  Ct shows lung opacity most consistent with inflammatory or infectious problem.   I will treat with Rocephin and zithromax.   Pt advised to schedule appointment at [facility] center.   She will need follow up ct in 6 weeks.     Final diagnoses:  None        Fransico Meadow, [place] 09/16/13 1540

## 2013-09-16 NOTE — ED Notes (Signed)
Pt returned from xray

## 2013-09-16 NOTE — ED Notes (Signed)
Pt transported to CT ?

## 2013-09-16 NOTE — ED Notes (Signed)
Pt states she has had back pain for several months and not getting any better.  Pt c/o upper back pain

## 2013-09-16 NOTE — Discharge Instructions (Signed)

## 2013-09-19 NOTE — ED Provider Notes (Signed)
History/physical exam/procedure(s) were performed by non-physician practitioner and as supervising physician I was immediately available for consultation/collaboration. I have reviewed all notes and am in agreement with care and plan.   Shaune Pollack, MD 09/19/13 [phone]

## 2013-10-08 DIAGNOSIS — G8254 Quadriplegia, C5-C7 incomplete: Secondary | ICD-10-CM

## 2013-10-08 DIAGNOSIS — F1490 Cocaine use, unspecified, uncomplicated: Secondary | ICD-10-CM

## 2013-10-08 HISTORY — PX: BACK SURGERY: SHX140

## 2013-10-08 HISTORY — DX: Quadriplegia, C5-C7 incomplete: G82.54

## 2013-10-08 HISTORY — DX: Cocaine use, unspecified, uncomplicated: F14.90

## 2013-10-11 DIAGNOSIS — IMO0002 Reserved for concepts with insufficient information to code with codable children: Secondary | ICD-10-CM | POA: Insufficient documentation

## 2013-10-15 DIAGNOSIS — R001 Bradycardia, unspecified: Secondary | ICD-10-CM | POA: Insufficient documentation

## 2013-10-27 DIAGNOSIS — Z981 Arthrodesis status: Secondary | ICD-10-CM | POA: Insufficient documentation

## 2013-10-27 DIAGNOSIS — Z430 Encounter for attention to tracheostomy: Secondary | ICD-10-CM | POA: Insufficient documentation

## 2013-10-27 DIAGNOSIS — F1120 Opioid dependence, uncomplicated: Secondary | ICD-10-CM

## 2013-10-27 DIAGNOSIS — F1920 Other psychoactive substance dependence, uncomplicated: Secondary | ICD-10-CM

## 2013-10-27 HISTORY — DX: Opioid dependence, uncomplicated: F11.20

## 2013-11-30 DIAGNOSIS — S14109A Unspecified injury at unspecified level of cervical spinal cord, initial encounter: Secondary | ICD-10-CM | POA: Insufficient documentation

## 2013-11-30 DIAGNOSIS — Z431 Encounter for attention to gastrostomy: Secondary | ICD-10-CM | POA: Insufficient documentation

## 2013-11-30 DIAGNOSIS — G8220 Paraplegia, unspecified: Secondary | ICD-10-CM | POA: Insufficient documentation

## 2013-11-30 DIAGNOSIS — G8921 Chronic pain due to trauma: Secondary | ICD-10-CM | POA: Insufficient documentation

## 2013-11-30 DIAGNOSIS — M792 Neuralgia and neuritis, unspecified: Secondary | ICD-10-CM | POA: Insufficient documentation

## 2013-12-01 IMAGING — CR DG FINGER MIDDLE 2+V*R*
3 series · 3 of 3 positions shown · non-contrast
Comparison: None

CLINICAL DATA: Removed splinter from middle finger 3 days ago, now
with redness, swelling, pain and open wound, question foreign body

RIGHT MIDDLE FINGER 2+V

[x finger pa right]
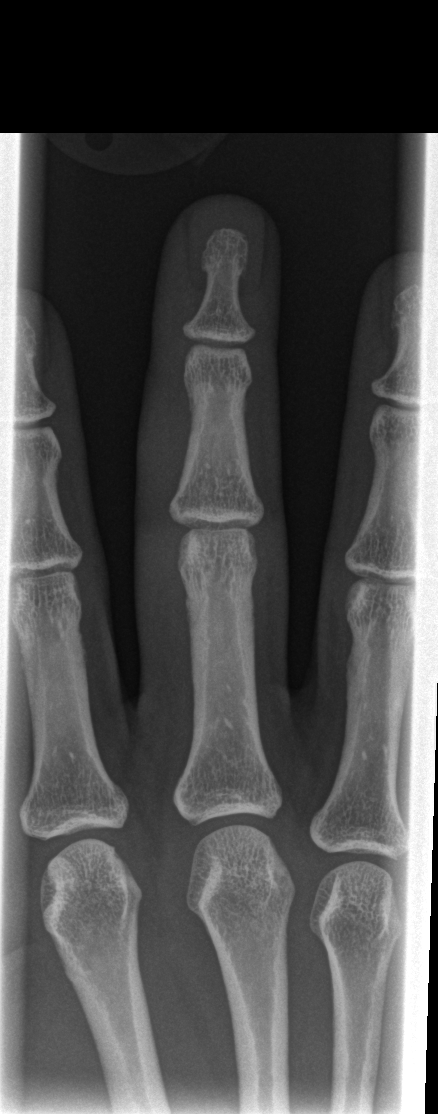

[x finger obl. right]
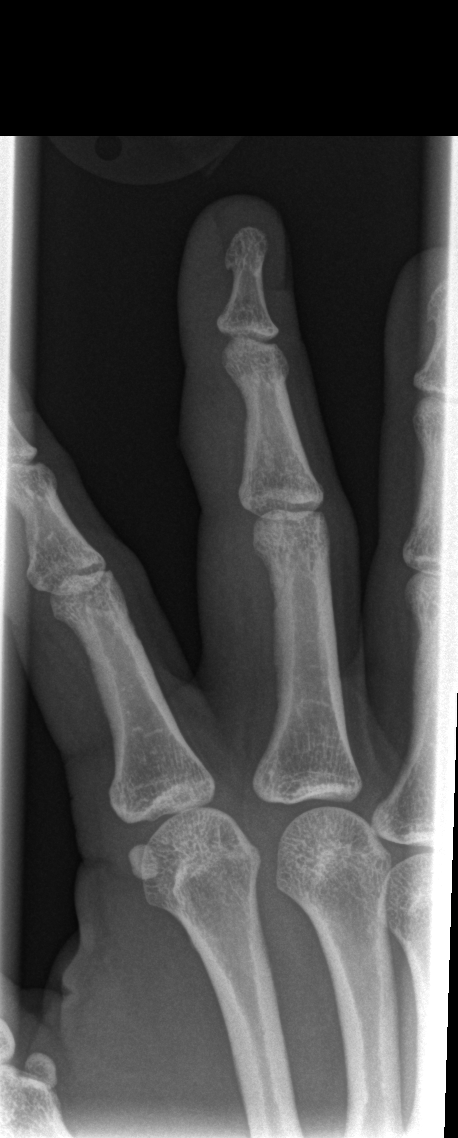

[x finger lateral right]
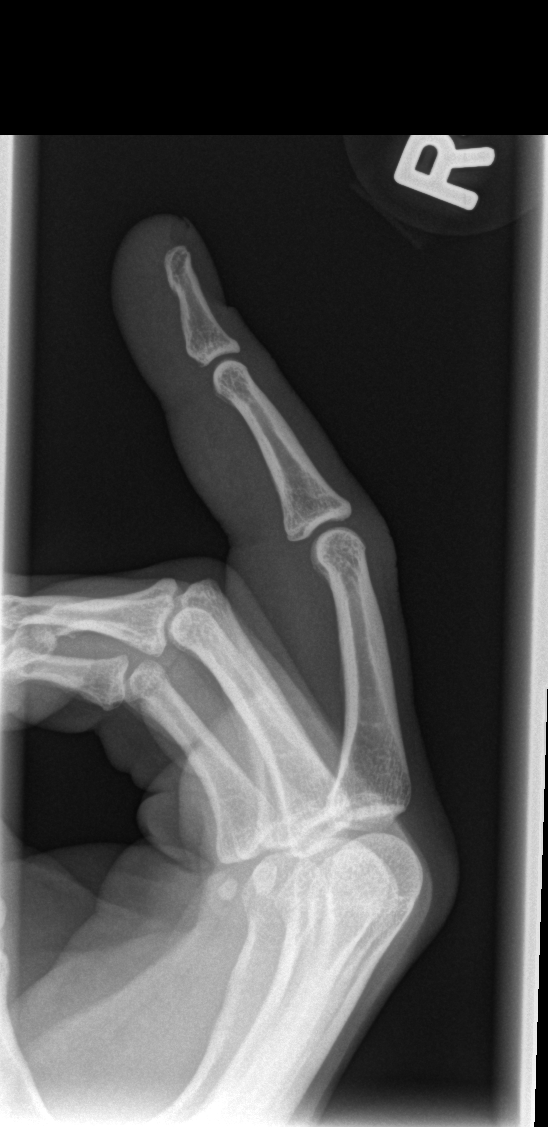

[3 of 3 positions shown; findings below may reference images not displayed]

FINDINGS: Diffuse soft tissue swelling right middle finger.
Osseous mineralization normal.
Joint spaces preserved.
No acute fracture, dislocation or bone destruction.
No definite radiopaque foreign body or soft tissue gas.
IMPRESSION: No acute osseous findings or retained radiopaque foreign body.

## 2014-01-06 DIAGNOSIS — L89154 Pressure ulcer of sacral region, stage 4: Secondary | ICD-10-CM | POA: Insufficient documentation

## 2014-01-06 DIAGNOSIS — L98429 Non-pressure chronic ulcer of back with unspecified severity: Secondary | ICD-10-CM | POA: Insufficient documentation

## 2014-02-03 IMAGING — CT CT ABD-PELV W/ CM
2 of 5 series · 17 of 46 positions shown, 19 images · IV contrast (APPLIED)
Comparison: None.

CLINICAL DATA: Vomiting and diarrhea.

CT ABDOMEN AND PELVIS WITH CONTRAST
TECHNIQUE: Multidetector CT imaging of the abdomen and pelvis was
performed following the standard protocol during bolus
administration of intravenous contrast.
Contrast: 100mL OMNIPAQUE IOHEXOL 300 MG/ML  SOLN

[Series 2: abd/pelvis 5.0 b31f · axial · 0.61mm/px · z∈[+673,+1033]mm · 14 of 81 slices shown, 16 images]
[im 5/81  soft-tissue]
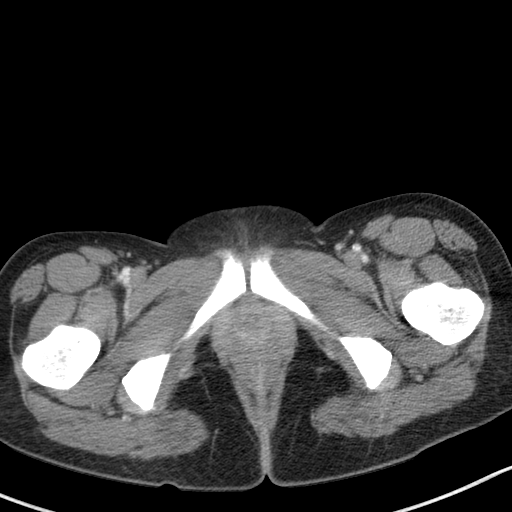
[im 5/81  bone]
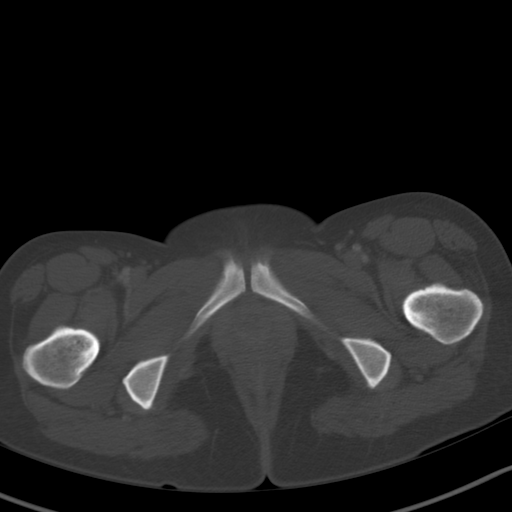
[im 13/81  soft-tissue]
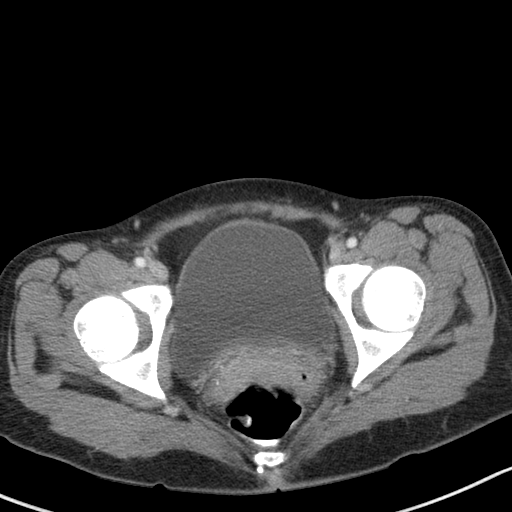
[im 17/81  soft-tissue]
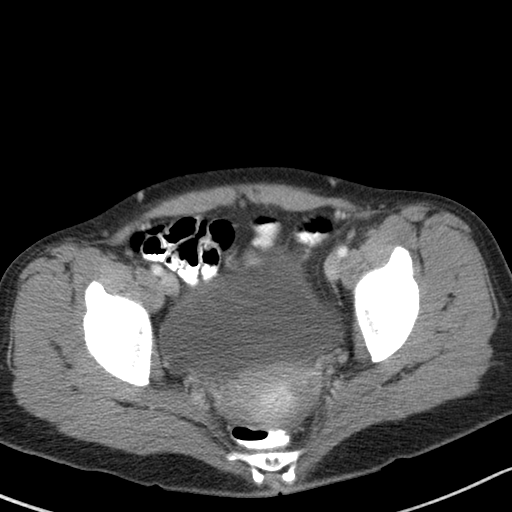
[im 21/81  soft-tissue]
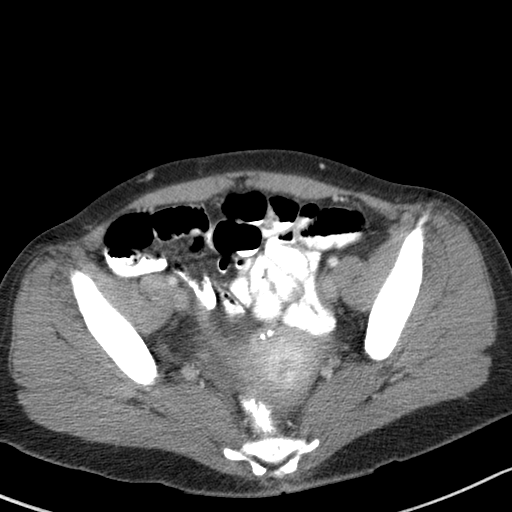
[im 29/81  soft-tissue]
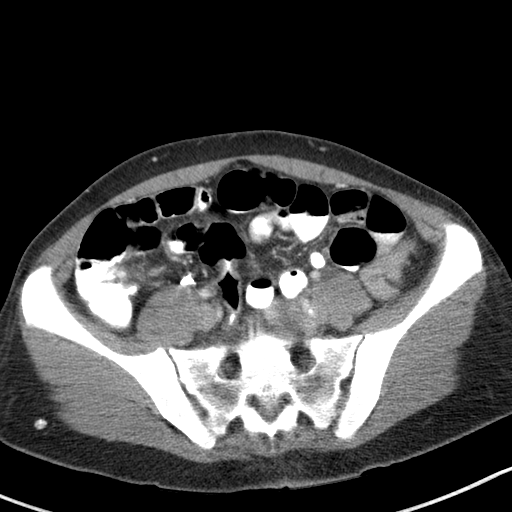
[im 33/81  soft-tissue]
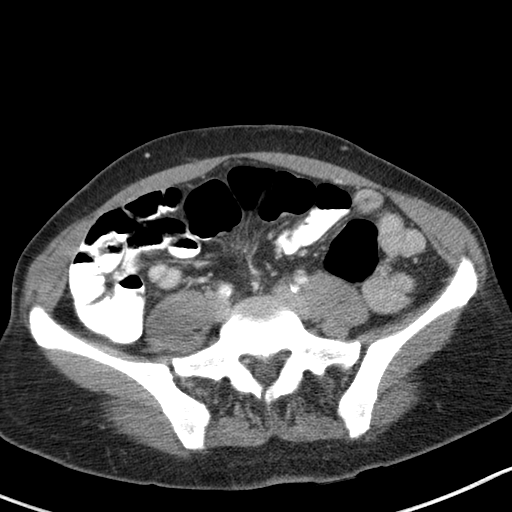
[im 37/81  soft-tissue]
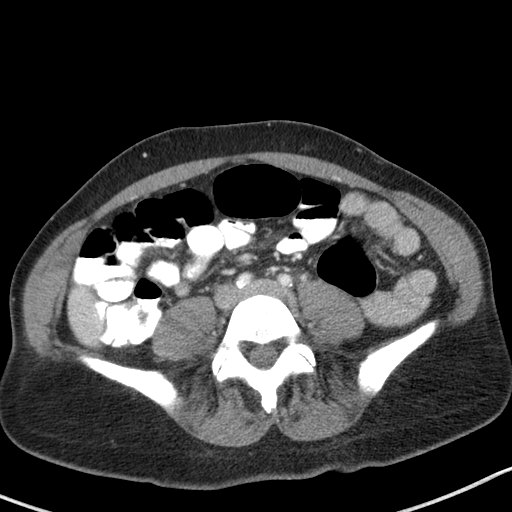
[im 45/81  soft-tissue]
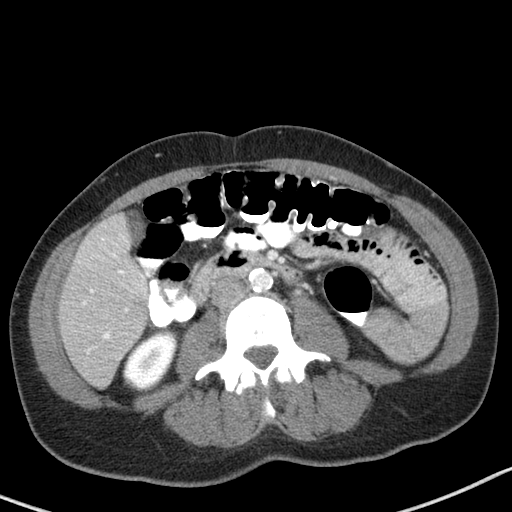
[im 49/81  soft-tissue]
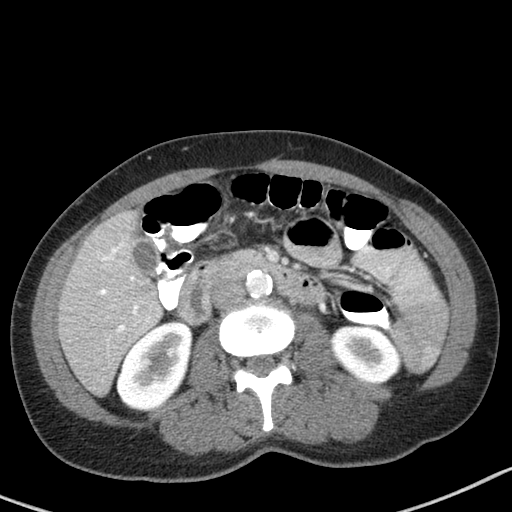
[im 49/81  bone]
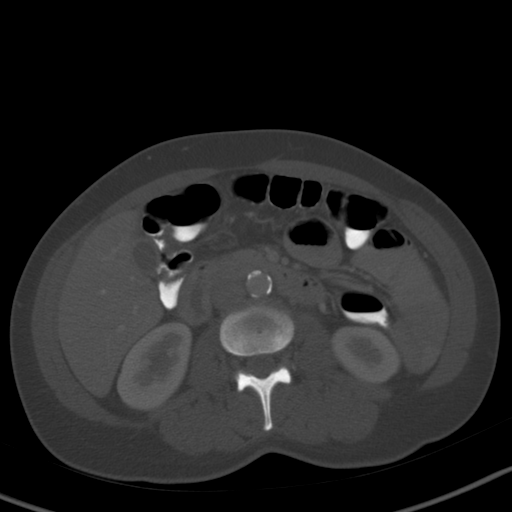
[im 53/81  soft-tissue]
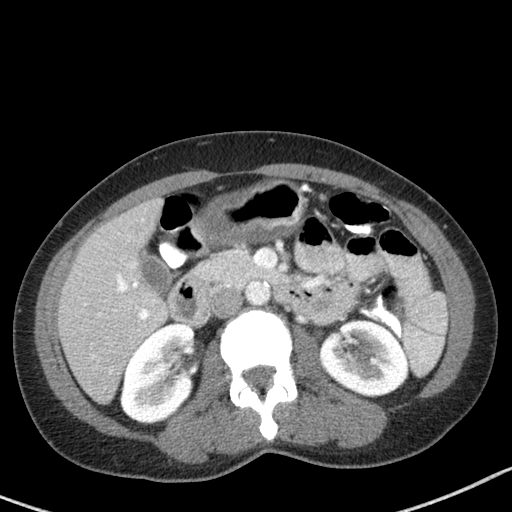
[im 61/81  soft-tissue]
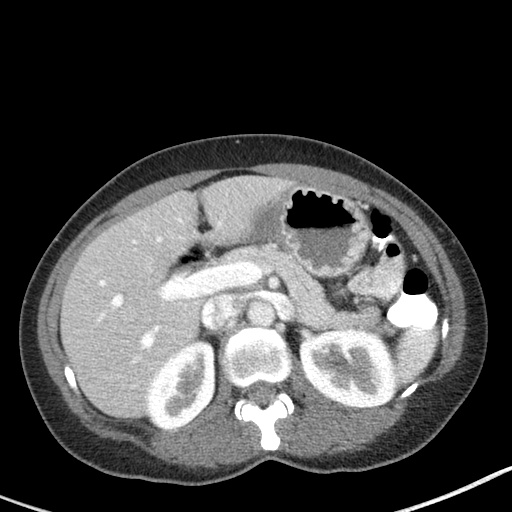
[im 65/81  soft-tissue]
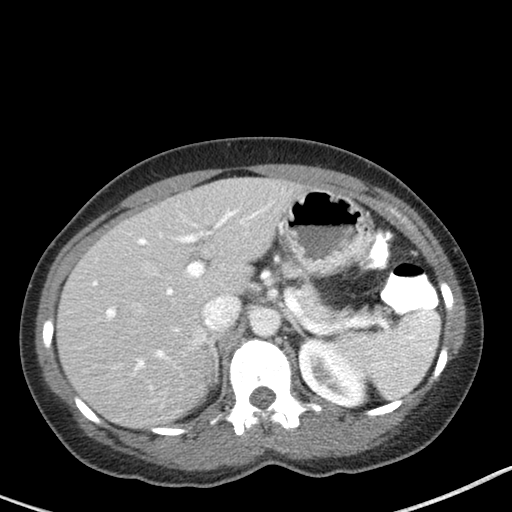
[im 69/81  soft-tissue]
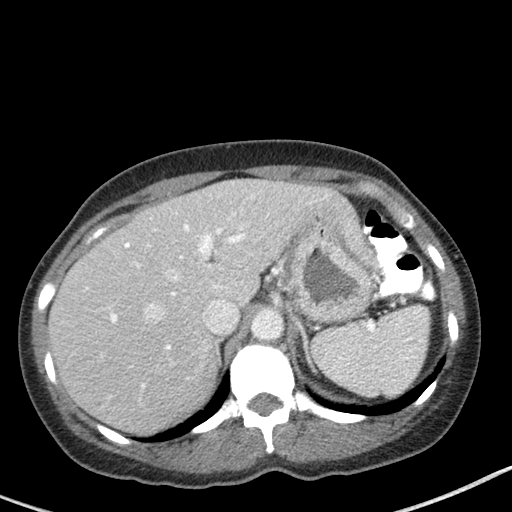
[im 77/81  soft-tissue]
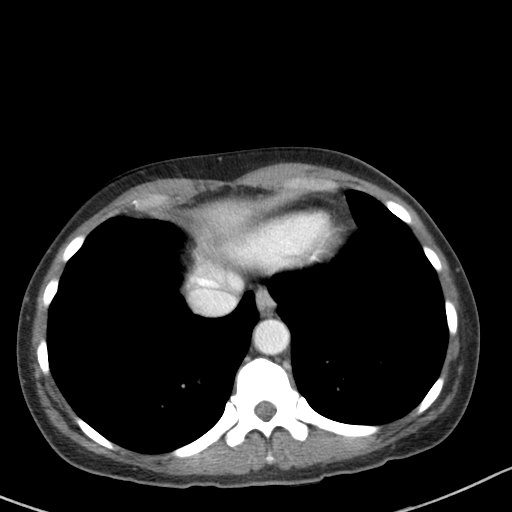

[Series 5: abd/pelvis 3.0 coronal · coronal · 0.62mm/px · 3 of 74 slices shown]
[im 25/74  soft-tissue]
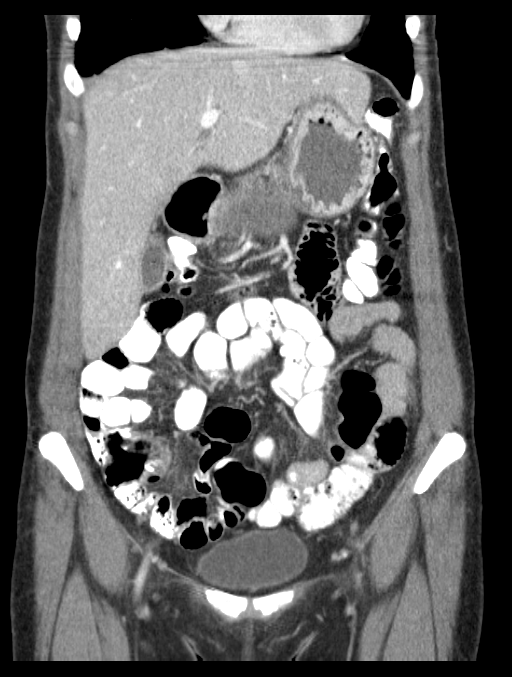
[im 33/74  soft-tissue]
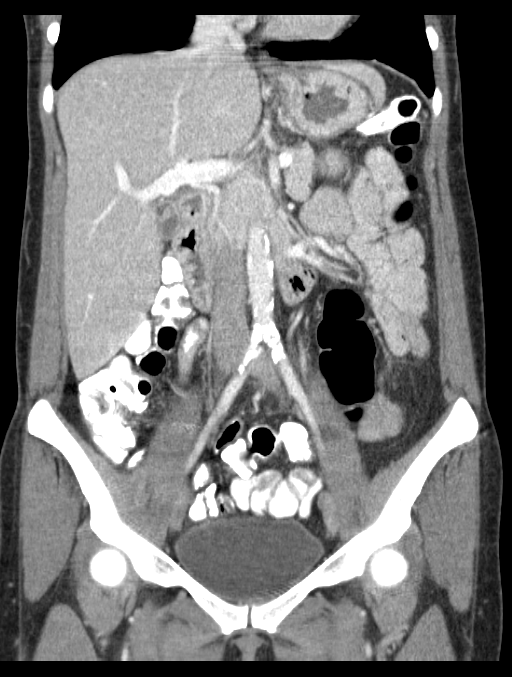
[im 41/74  soft-tissue]
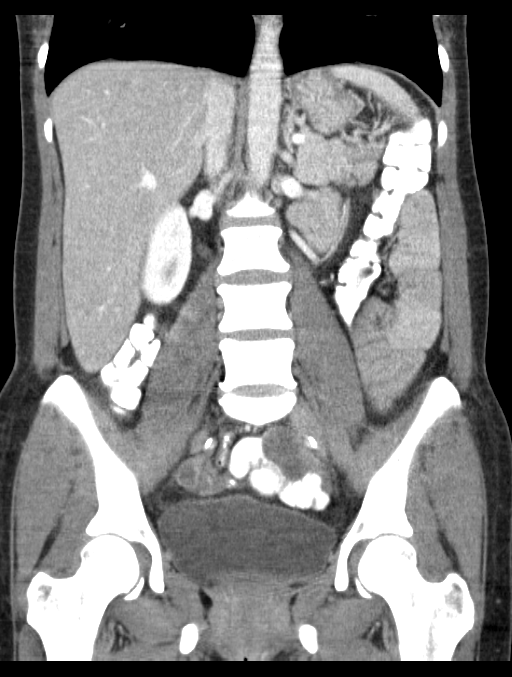

[17 of 46 positions shown; findings below may reference images not displayed]

FINDINGS: Lung bases are clear.  Liver gallbladder and bile ducts
are normal.  Pancreas spleen and kidneys are normal.  No renal mass
or obstruction.

Atherosclerotic aorta without aneurysm.  Negative for bowel
obstruction or bowel thickening.  No free fluid.  No mass or
adenopathy.
IMPRESSION: No acute abnormality.

## 2014-03-31 DIAGNOSIS — N390 Urinary tract infection, site not specified: Secondary | ICD-10-CM | POA: Insufficient documentation

## 2014-07-21 DIAGNOSIS — N390 Urinary tract infection, site not specified: Secondary | ICD-10-CM | POA: Insufficient documentation

## 2014-07-21 DIAGNOSIS — T83511A Infection and inflammatory reaction due to indwelling urethral catheter, initial encounter: Secondary | ICD-10-CM

## 2014-08-01 ENCOUNTER — Inpatient Hospital Stay (HOSPITAL_COMMUNITY)
Admission: EM | Admit: 2014-08-01 | Discharge: 2014-08-04 | DRG: 698 | Disposition: A | Discharge status: Home / Self Care | Payer: Medicaid Other | Attending: Internal Medicine | Admitting: Internal Medicine

## 2014-08-01 ENCOUNTER — Encounter (HOSPITAL_COMMUNITY): Payer: Self-pay | Admitting: Emergency Medicine

## 2014-08-01 ENCOUNTER — Emergency Department (HOSPITAL_COMMUNITY): Payer: Medicaid Other

## 2014-08-01 DIAGNOSIS — D649 Anemia, unspecified: Secondary | ICD-10-CM | POA: Diagnosis present

## 2014-08-01 DIAGNOSIS — L8990 Pressure ulcer of unspecified site, unspecified stage: Secondary | ICD-10-CM | POA: Insufficient documentation

## 2014-08-01 DIAGNOSIS — F17210 Nicotine dependence, cigarettes, uncomplicated: Secondary | ICD-10-CM | POA: Diagnosis present

## 2014-08-01 DIAGNOSIS — E611 Iron deficiency: Secondary | ICD-10-CM | POA: Diagnosis present

## 2014-08-01 DIAGNOSIS — Z950 Presence of cardiac pacemaker: Secondary | ICD-10-CM

## 2014-08-01 DIAGNOSIS — J45909 Unspecified asthma, uncomplicated: Secondary | ICD-10-CM | POA: Diagnosis present

## 2014-08-01 DIAGNOSIS — L89154 Pressure ulcer of sacral region, stage 4: Secondary | ICD-10-CM | POA: Diagnosis present

## 2014-08-01 DIAGNOSIS — Z880 Allergy status to penicillin: Secondary | ICD-10-CM

## 2014-08-01 DIAGNOSIS — G903 Multi-system degeneration of the autonomic nervous system: Secondary | ICD-10-CM

## 2014-08-01 DIAGNOSIS — Y846 Urinary catheterization as the cause of abnormal reaction of the patient, or of later complication, without mention of misadventure at the time of the procedure: Secondary | ICD-10-CM | POA: Diagnosis present

## 2014-08-01 DIAGNOSIS — G8250 Quadriplegia, unspecified: Secondary | ICD-10-CM | POA: Diagnosis present

## 2014-08-01 DIAGNOSIS — E861 Hypovolemia: Secondary | ICD-10-CM | POA: Diagnosis present

## 2014-08-01 DIAGNOSIS — B3749 Other urogenital candidiasis: Secondary | ICD-10-CM

## 2014-08-01 DIAGNOSIS — N390 Urinary tract infection, site not specified: Secondary | ICD-10-CM

## 2014-08-01 DIAGNOSIS — R319 Hematuria, unspecified: Secondary | ICD-10-CM

## 2014-08-01 DIAGNOSIS — T8351XA Infection and inflammatory reaction due to indwelling urinary catheter, initial encounter: Principal | ICD-10-CM | POA: Diagnosis present

## 2014-08-01 DIAGNOSIS — Z881 Allergy status to other antibiotic agents status: Secondary | ICD-10-CM

## 2014-08-01 DIAGNOSIS — I959 Hypotension, unspecified: Secondary | ICD-10-CM | POA: Diagnosis present

## 2014-08-01 DIAGNOSIS — K219 Gastro-esophageal reflux disease without esophagitis: Secondary | ICD-10-CM | POA: Diagnosis present

## 2014-08-01 DIAGNOSIS — E2740 Unspecified adrenocortical insufficiency: Secondary | ICD-10-CM | POA: Diagnosis present

## 2014-08-01 DIAGNOSIS — E860 Dehydration: Secondary | ICD-10-CM | POA: Diagnosis present

## 2014-08-01 MED ORDER — SODIUM CHLORIDE 0.9 % IV BOLUS (SEPSIS)
1000.0000 mL | Freq: Once | INTRAVENOUS | Status: AC
  Administered 2014-08-02: 1000 mL via INTRAVENOUS

## 2014-08-01 MED ORDER — IPRATROPIUM-ALBUTEROL 0.5-2.5 (3) MG/3ML IN SOLN
3.0000 mL | Freq: Once | RESPIRATORY_TRACT | Status: AC
  Administered 2014-08-02: 3 mL via RESPIRATORY_TRACT
  Filled 2014-08-01: qty 3

## 2014-08-01 NOTE — ED Notes (Signed)
Bed: DT26 Expected date:  Expected time:  Means of arrival:  Comments: EMS 44yo Quad / hypotension / ? UTI

## 2014-08-01 NOTE — ED Notes (Signed)
Patient is hypotensive with a blood pressure 56/40. Patient states that when her bp is that low, she usually have an uti.

## 2014-08-01 NOTE — ED Provider Notes (Signed)
TIME SEEN: 11:05 AM  CHIEF COMPLAINT: Hypotension  HPI: Patient is a 44 y.o. 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 Reports that her blood pressure is normally in the low 90s/low 60s. She states that her mother checks her blood pressure every night and tonight it was 56/37. She states often when her blood pressure is low she has a urinary tract infection. She does have a chronic indwelling Foley catheter. She denies any new pain. No fever, nausea, vomiting or diarrhea. No chest pain or shortness of breath. No cough. States she normally takes oxycodone 15 mg every 6 hours as needed which she took 37.5 mg at 6 PM tonight because of her chronic pain. She thinks this may be contributing to her low blood pressure. She denies feeling lightheaded, dizzy.  PCP is Dr. Shelton Silvas at [facility]: See HPI Constitutional: no fever  Eyes: no drainage  ENT: no runny nose   Cardiovascular:  no chest pain  Resp: no SOB  GI: no vomiting GU: no dysuria Integumentary: no rash  Allergy: no hives  Musculoskeletal: no leg swelling  Neurological: no slurred speech ROS otherwise negative  PAST MEDICAL HISTORY/PAST SURGICAL HISTORY:  Past Medical History  Diagnosis Date  . Asthma   . Pleurisy   . GERD (gastroesophageal reflux disease)     MEDICATIONS:  Prior to Admission medications   Medication Sig Start Date End Date Taking? Authorizing Provider  albuterol (PROVENTIL HFA;VENTOLIN HFA) 108 (90 BASE) MCG/ACT inhaler Inhale 2 puffs into the lungs every 6 (six) hours as needed. For shortness of breath     Historical Provider, MD  aspirin 325 MG EC tablet Take 325 mg by mouth daily. For pain     Historical Provider, MD  azithromycin (ZITHROMAX) 250 MG tablet Take 1 tablet (250 mg total) by mouth daily. Take first 2 tablets together, then 1 every day until finished. 09/16/13   Fransico Meadow, PA-C  fluconazole  (DIFLUCAN) 150 MG tablet Take 1 tablet (150 mg total) by mouth once. 08/30/12   Gregor Hams, MD  HYDROcodone-acetaminophen (NORCO) 5-325 MG per tablet Take 1 tablet by mouth every 6 (six) hours as needed. 03/31/13   Ernestina Patches, MD  HYDROcodone-acetaminophen (NORCO/VICODIN) 5-325 MG per tablet Take 2 tablets by mouth every 4 (four) hours as needed. 09/16/13   Fransico Meadow, PA-C  metroNIDAZOLE (FLAGYL) 500 MG tablet Take 1 tablet (500 mg total) by mouth 2 (two) times daily. 08/30/12   Gregor Hams, MD  omeprazole (PRILOSEC) 20 MG capsule Take 1 capsule (20 mg total) by mouth daily. 09/13/10 05/09/12  Shanon Rosser, MD  vitamin C (ASCORBIC ACID) 500 MG tablet Take 1,000 mg by mouth daily.      Historical Provider, MD    ALLERGIES:  Allergies  Allergen Reactions  . Erythromycin Nausea And Vomiting  . Nitrofurantoin Monohyd Macro Nausea And Vomiting  . Penicillins Hives    SOCIAL HISTORY:  History  Substance Use Topics  . Smoking status: Current Every Day Smoker -- 1.00 packs/day for 25 years    Types: Cigarettes  . Smokeless tobacco: Not on file  . Alcohol Use: Yes     Comment: 2 x weekly    FAMILY HISTORY: History reviewed. No pertinent family history.  EXAM: BP 73/46 mmHg  Pulse 59  Temp(Src) 97.9 F (36.6 C) (Oral)  Resp 18  SpO2 91% CONSTITUTIONAL: Alert and oriented  and responds appropriately to questions. Chronically ill-appearing, in no significant distress, mentating normally, afebrile and nontoxic HEAD: Normocephalic EYES: Conjunctivae clear, PERRL ENT: normal nose; no rhinorrhea; dry mucous membranes; pharynx without lesions noted NECK: Supple, no meningismus, no LAD  CARD: RRR; S1 and S2 appreciated; no murmurs, no clicks, no rubs, no gallops RESP: Normal chest excursion without splinting or tachypnea; breath sounds equal bilaterally but she does have expiratory wheezing diffusely, no rhonchi, no rales, no hypoxia or respiratory distress, speaking full  sentences ABD/GI: Normal bowel sounds; non-distended; soft, non-tender, no rebound, no guarding, no peritoneal signs BACK:  The back appears normal and is non-tender to palpation, there is no CVA tenderness EXT: non-tender to palpation; no edema; normal capillary refill; no cyanosis, no calf tenderness or swelling    SKIN: Normal color for age and race; warm, no rash, multiple sacral decubitus ulcers do not appear infected NEURO: Patient has movement of her upper extremities but is a paraplegic with no movement of her lower extremity is her sensation, cranial nerves II through XII intact PSYCH: The patient's mood and manner are appropriate. Grooming and personal hygiene are appropriate.  MEDICAL DECISION MAKING: Patient here with hypotension. May be related to increase pain medication tonight but she is at risk for infection. We'll obtain labs, chest x-ray, urine, cultures. We'll give IV fluids. Discussed with patient she will need admission and she is reluctant but agrees.  ED PROGRESS: Patient's labs unremarkable. No leukocytosis. Normal lactate. Negative troponin. She does appear to have a urinary tract infection. Previous cultures have grown Escherichia coli that was sensitive to several sport's. Will give ceftriaxone. Blood pressure is significantly improving with IV hydration. D/w Dr. Hal Hope for admission to stepdown.    EKG Interpretation  Date/Time:  Tuesday August 01 2014 23:45:42 EDT Ventricular Rate:  80 PR Interval:  168 QRS Duration: 80 QT Interval:  413 QTC Calculation: 476 R Axis:   69 Text Interpretation:  Normal sinus rhythm Artifact No significant change since last tracing Confirmed by Govanni Plemons,  DO, Teyton Pattillo ([id number]) on 08/01/2014 11:58:45 PM        CRITICAL CARE Performed by: Nyra Jabs   Total critical care time: 45 minutes  Critical care time was exclusive of separately billable procedures and treating other patients.  Critical care was necessary to treat or  prevent imminent or life-threatening deterioration.  Critical care was time spent personally by me on the following activities: development of treatment plan with patient and/or surrogate as well as nursing, discussions with consultants, evaluation of patient's response to treatment, examination of patient, obtaining history from patient or surrogate, ordering and performing treatments and interventions, ordering and review of laboratory studies, ordering and review of radiographic studies, pulse oximetry and re-evaluation of patient's condition.   [place], DO 08/02/14 [phone]

## 2014-08-02 ENCOUNTER — Encounter (HOSPITAL_COMMUNITY): Payer: Self-pay | Admitting: Internal Medicine

## 2014-08-02 DIAGNOSIS — Z881 Allergy status to other antibiotic agents status: Secondary | ICD-10-CM | POA: Diagnosis not present

## 2014-08-02 DIAGNOSIS — I950 Idiopathic hypotension: Secondary | ICD-10-CM

## 2014-08-02 DIAGNOSIS — E2740 Unspecified adrenocortical insufficiency: Secondary | ICD-10-CM | POA: Diagnosis present

## 2014-08-02 DIAGNOSIS — Z880 Allergy status to penicillin: Secondary | ICD-10-CM | POA: Diagnosis not present

## 2014-08-02 DIAGNOSIS — D649 Anemia, unspecified: Secondary | ICD-10-CM | POA: Diagnosis present

## 2014-08-02 DIAGNOSIS — K219 Gastro-esophageal reflux disease without esophagitis: Secondary | ICD-10-CM | POA: Diagnosis present

## 2014-08-02 DIAGNOSIS — N390 Urinary tract infection, site not specified: Secondary | ICD-10-CM

## 2014-08-02 DIAGNOSIS — G8250 Quadriplegia, unspecified: Secondary | ICD-10-CM | POA: Diagnosis present

## 2014-08-02 DIAGNOSIS — L8990 Pressure ulcer of unspecified site, unspecified stage: Secondary | ICD-10-CM | POA: Insufficient documentation

## 2014-08-02 DIAGNOSIS — E611 Iron deficiency: Secondary | ICD-10-CM | POA: Diagnosis present

## 2014-08-02 DIAGNOSIS — B3749 Other urogenital candidiasis: Secondary | ICD-10-CM | POA: Diagnosis present

## 2014-08-02 DIAGNOSIS — I959 Hypotension, unspecified: Secondary | ICD-10-CM | POA: Diagnosis present

## 2014-08-02 DIAGNOSIS — Y846 Urinary catheterization as the cause of abnormal reaction of the patient, or of later complication, without mention of misadventure at the time of the procedure: Secondary | ICD-10-CM | POA: Diagnosis present

## 2014-08-02 DIAGNOSIS — L89154 Pressure ulcer of sacral region, stage 4: Secondary | ICD-10-CM | POA: Diagnosis present

## 2014-08-02 DIAGNOSIS — E860 Dehydration: Secondary | ICD-10-CM | POA: Diagnosis present

## 2014-08-02 DIAGNOSIS — Z950 Presence of cardiac pacemaker: Secondary | ICD-10-CM | POA: Diagnosis not present

## 2014-08-02 DIAGNOSIS — I951 Orthostatic hypotension: Secondary | ICD-10-CM | POA: Diagnosis not present

## 2014-08-02 DIAGNOSIS — F17210 Nicotine dependence, cigarettes, uncomplicated: Secondary | ICD-10-CM | POA: Diagnosis present

## 2014-08-02 DIAGNOSIS — T8351XA Infection and inflammatory reaction due to indwelling urinary catheter, initial encounter: Secondary | ICD-10-CM | POA: Diagnosis present

## 2014-08-02 DIAGNOSIS — J45909 Unspecified asthma, uncomplicated: Secondary | ICD-10-CM | POA: Diagnosis present

## 2014-08-02 DIAGNOSIS — E861 Hypovolemia: Secondary | ICD-10-CM | POA: Diagnosis present

## 2014-08-02 LAB — CBC WITH DIFFERENTIAL/PLATELET
Basophils Absolute: 0 10*3/uL (ref 0.0–0.1)
Basophils Absolute: 0 10*3/uL (ref 0.0–0.1)
Basophils Relative: 0 % (ref 0–1)
Basophils Relative: 0 % (ref 0–1)
Eosinophils Absolute: 0.2 10*3/uL (ref 0.0–0.7)
Eosinophils Absolute: 0.2 10*3/uL (ref 0.0–0.7)
Eosinophils Relative: 2 % (ref 0–5)
Eosinophils Relative: 2 % (ref 0–5)
HEMATOCRIT: 29.6 % — AB (ref 36.0–46.0)
HEMATOCRIT: 28.1 % — AB (ref 36.0–46.0)
HEMOGLOBIN: 9.2 g/dL — AB (ref 12.0–15.0)
Hemoglobin: 9.3 g/dL — ABNORMAL LOW (ref 12.0–15.0)
Lymphocytes Relative: 27 % (ref 12–46)
Lymphocytes Relative: 29 % (ref 12–46)
Lymphs Abs: 2.6 10*3/uL (ref 0.7–4.0)
Lymphs Abs: 2.8 10*3/uL (ref 0.7–4.0)
MCH: 29.3 pg (ref 26.0–34.0)
MCH: 28.2 pg (ref 26.0–34.0)
MCHC: 32.7 g/dL (ref 30.0–36.0)
MCHC: 31.4 g/dL (ref 30.0–36.0)
MCV: 89.7 fL (ref 78.0–100.0)
MCV: 89.5 fL (ref 78.0–100.0)
MONO ABS: 0.6 10*3/uL (ref 0.1–1.0)
MONOS PCT: 7 % (ref 3–12)
Monocytes Absolute: 0.6 10*3/uL (ref 0.1–1.0)
Monocytes Relative: 6 % (ref 3–12)
NEUTROS ABS: 6 10*3/uL (ref 1.7–7.7)
NEUTROS PCT: 64 % (ref 43–77)
Neutro Abs: 6.2 10*3/uL (ref 1.7–7.7)
Neutrophils Relative %: 63 % (ref 43–77)
PLATELETS: 461 10*3/uL — AB (ref 150–400)
Platelets: 515 10*3/uL — ABNORMAL HIGH (ref 150–400)
RBC: 3.14 MIL/uL — ABNORMAL LOW (ref 3.87–5.11)
RBC: 3.3 MIL/uL — ABNORMAL LOW (ref 3.87–5.11)
RDW: 15.7 % — AB (ref 11.5–15.5)
RDW: 16 % — AB (ref 11.5–15.5)
WBC: 9.6 10*3/uL (ref 4.0–10.5)
WBC: 9.6 10*3/uL (ref 4.0–10.5)

## 2014-08-02 LAB — COMPREHENSIVE METABOLIC PANEL
ALK PHOS: 84 U/L (ref 38–126)
ALT: 6 U/L — ABNORMAL LOW (ref 14–54)
ALT: 6 U/L — AB (ref 14–54)
AST: 8 U/L — ABNORMAL LOW (ref 15–41)
AST: 10 U/L — AB (ref 15–41)
Albumin: 1.7 g/dL — ABNORMAL LOW (ref 3.5–5.0)
Albumin: 1.7 g/dL — ABNORMAL LOW (ref 3.5–5.0)
Alkaline Phosphatase: 92 U/L (ref 38–126)
Anion gap: 8 (ref 5–15)
Anion gap: 9 (ref 5–15)
BUN: 9 mg/dL (ref 6–20)
BUN: 7 mg/dL (ref 6–20)
CHLORIDE: 115 mmol/L — AB (ref 101–111)
CO2: 21 mmol/L — ABNORMAL LOW (ref 22–32)
CO2: 27 mmol/L (ref 22–32)
CREATININE: 0.51 mg/dL (ref 0.44–1.00)
Calcium: 8.1 mg/dL — ABNORMAL LOW (ref 8.9–10.3)
Calcium: 7.6 mg/dL — ABNORMAL LOW (ref 8.9–10.3)
Chloride: 108 mmol/L (ref 101–111)
Creatinine, Ser: 0.57 mg/dL (ref 0.44–1.00)
GFR calc Af Amer: >60 mL/min (ref >60)
GFR calc Af Amer: >60 mL/min (ref >60)
GLUCOSE: 107 mg/dL — AB (ref 65–99)
Glucose, Bld: 90 mg/dL (ref 65–99)
POTASSIUM: 3.4 mmol/L — AB (ref 3.5–5.1)
Potassium: 3.8 mmol/L (ref 3.5–5.1)
Sodium: 143 mmol/L (ref 135–145)
Sodium: 145 mmol/L (ref 135–145)
TOTAL PROTEIN: 5 g/dL — AB (ref 6.5–8.1)
Total Bilirubin: 0.1 mg/dL — ABNORMAL LOW (ref 0.3–1.2)
Total Bilirubin: 0.2 mg/dL — ABNORMAL LOW (ref 0.3–1.2)
Total Protein: 5.2 g/dL — ABNORMAL LOW (ref 6.5–8.1)

## 2014-08-02 LAB — I-STAT CHEM 8, ED
BUN: 7 mg/dL (ref 6–20)
CHLORIDE: 105 mmol/L (ref 101–111)
CREATININE: 0.6 mg/dL (ref 0.44–1.00)
Calcium, Ion: 1.15 mmol/L (ref 1.12–1.23)
Glucose, Bld: 87 mg/dL (ref 65–99)
HCT: 27 % — ABNORMAL LOW (ref 36.0–46.0)
Hemoglobin: 9.2 g/dL — ABNORMAL LOW (ref 12.0–15.0)
POTASSIUM: 3.8 mmol/L (ref 3.5–5.1)
Sodium: 141 mmol/L (ref 135–145)
TCO2: 26 mmol/L (ref 0–100)

## 2014-08-02 LAB — VITAMIN B12: VITAMIN B 12: 267 pg/mL (ref 180–914)

## 2014-08-02 LAB — IRON AND TIBC
Iron: 22 ug/dL — ABNORMAL LOW (ref 28–170)
Saturation Ratios: 17 % (ref 10.4–31.8)
TIBC: 126 ug/dL — ABNORMAL LOW (ref 250–450)
UIBC: 104 ug/dL

## 2014-08-02 LAB — I-STAT TROPONIN, ED: Troponin i, poc: 0 ng/mL (ref 0.00–0.08)

## 2014-08-02 LAB — URINE MICROSCOPIC-ADD ON

## 2014-08-02 LAB — TROPONIN I: Troponin I: <0.03 ng/mL (ref <0.031)

## 2014-08-02 LAB — ABO/RH: ABO/RH(D): A POS

## 2014-08-02 LAB — URINALYSIS, ROUTINE W REFLEX MICROSCOPIC
Bilirubin Urine: NEGATIVE
GLUCOSE, UA: NEGATIVE mg/dL
Ketones, ur: NEGATIVE mg/dL
NITRITE: NEGATIVE
Protein, ur: NEGATIVE mg/dL
Specific Gravity, Urine: 1.007 (ref 1.005–1.030)
UROBILINOGEN UA: 0.2 mg/dL (ref 0.0–1.0)
pH: 6 (ref 5.0–8.0)

## 2014-08-02 LAB — RAPID URINE DRUG SCREEN, HOSP PERFORMED
AMPHETAMINES: NOT DETECTED
BARBITURATES: NOT DETECTED
BENZODIAZEPINES: NOT DETECTED
Cocaine: NOT DETECTED
OPIATES: POSITIVE — AB
Tetrahydrocannabinol: NOT DETECTED

## 2014-08-02 LAB — CORTISOL: Cortisol, Plasma: 6.1 ug/dL

## 2014-08-02 LAB — TYPE AND SCREEN
ABO/RH(D): A POS
ANTIBODY SCREEN: NEGATIVE

## 2014-08-02 LAB — FOLATE: Folate: 6.4 ng/mL (ref >5.9)

## 2014-08-02 LAB — MRSA PCR SCREENING: MRSA by PCR: NEGATIVE

## 2014-08-02 LAB — PREGNANCY, URINE: Preg Test, Ur: NEGATIVE

## 2014-08-02 LAB — I-STAT CG4 LACTIC ACID, ED: Lactic Acid, Venous: 1.16 mmol/L (ref 0.5–2.0)

## 2014-08-02 LAB — TSH: TSH: 2.643 u[IU]/mL (ref 0.350–4.500)

## 2014-08-02 LAB — FERRITIN: Ferritin: 79 ng/mL (ref 11–307)

## 2014-08-02 MED ORDER — ACETAMINOPHEN 325 MG PO TABS: 650.0000 mg | ORAL_TABLET | Freq: Four times a day (QID) | ORAL | Status: DC | PRN

## 2014-08-02 MED ORDER — DEXTROSE 5 % IV SOLN
1.0000 g | INTRAVENOUS | Status: DC
  Administered 2014-08-02 – 2014-08-03 (×2): 1 g via INTRAVENOUS
  Filled 2014-08-02 (×2): qty 10

## 2014-08-02 MED ORDER — ONDANSETRON HCL 4 MG PO TABS: 4.0000 mg | ORAL_TABLET | Freq: Four times a day (QID) | ORAL | Status: DC | PRN

## 2014-08-02 MED ORDER — POTASSIUM CHLORIDE CRYS ER 20 MEQ PO TBCR
40.0000 meq | EXTENDED_RELEASE_TABLET | Freq: Once | ORAL | Status: AC
  Administered 2014-08-02: 40 meq via ORAL
  Filled 2014-08-02: qty 2

## 2014-08-02 MED ORDER — ALBUTEROL SULFATE HFA 108 (90 BASE) MCG/ACT IN AERS: 2.0000 | INHALATION_SPRAY | Freq: Four times a day (QID) | RESPIRATORY_TRACT | Status: DC | PRN

## 2014-08-02 MED ORDER — SODIUM CHLORIDE 0.9 % IV SOLN
INTRAVENOUS | Status: DC
  Administered 2014-08-02: 125 mL/h via INTRAVENOUS

## 2014-08-02 MED ORDER — DIPHENHYDRAMINE HCL 25 MG PO CAPS
25.0000 mg | ORAL_CAPSULE | Freq: Every evening | ORAL | Status: DC | PRN
  Administered 2014-08-02 – 2014-08-03 (×3): 25 mg via ORAL
  Filled 2014-08-02 (×3): qty 1

## 2014-08-02 MED ORDER — CYCLOBENZAPRINE HCL 5 MG PO TABS
5.0000 mg | ORAL_TABLET | Freq: Three times a day (TID) | ORAL | Status: DC | PRN
  Filled 2014-08-02: qty 1

## 2014-08-02 MED ORDER — MIDODRINE HCL 5 MG PO TABS
10.0000 mg | ORAL_TABLET | Freq: Three times a day (TID) | ORAL | Status: DC
  Administered 2014-08-02 (×3): 10 mg via ORAL
  Filled 2014-08-02 (×7): qty 2

## 2014-08-02 MED ORDER — SODIUM CHLORIDE 0.9 % IV BOLUS (SEPSIS)
500.0000 mL | Freq: Once | INTRAVENOUS | Status: AC
  Administered 2014-08-02: 500 mL via INTRAVENOUS

## 2014-08-02 MED ORDER — CITALOPRAM HYDROBROMIDE 20 MG PO TABS
20.0000 mg | ORAL_TABLET | Freq: Every day | ORAL | Status: DC
  Administered 2014-08-02 – 2014-08-04 (×3): 20 mg via ORAL
  Filled 2014-08-02 (×3): qty 1

## 2014-08-02 MED ORDER — OXYCODONE HCL 5 MG PO TABS
15.0000 mg | ORAL_TABLET | ORAL | Status: DC | PRN
  Administered 2014-08-02 – 2014-08-04 (×10): 15 mg via ORAL
  Filled 2014-08-02 (×10): qty 3

## 2014-08-02 MED ORDER — BACLOFEN 20 MG PO TABS
20.0000 mg | ORAL_TABLET | Freq: Three times a day (TID) | ORAL | Status: DC
  Administered 2014-08-02 – 2014-08-04 (×6): 20 mg via ORAL
  Filled 2014-08-02 (×3): qty 1
  Filled 2014-08-02 (×4): qty 2
  Filled 2014-08-02: qty 1

## 2014-08-02 MED ORDER — SODIUM CHLORIDE 0.9 % IV BOLUS (SEPSIS)
1000.0000 mL | Freq: Once | INTRAVENOUS | Status: AC
  Administered 2014-08-02: 1000 mL via INTRAVENOUS

## 2014-08-02 MED ORDER — OXYCODONE HCL 5 MG PO TABS
15.0000 mg | ORAL_TABLET | Freq: Once | ORAL | Status: AC
  Administered 2014-08-02: 15 mg via ORAL
  Filled 2014-08-02: qty 3

## 2014-08-02 MED ORDER — ALBUTEROL SULFATE (2.5 MG/3ML) 0.083% IN NEBU: 2.5000 mg | INHALATION_SOLUTION | Freq: Four times a day (QID) | RESPIRATORY_TRACT | Status: DC | PRN

## 2014-08-02 MED ORDER — ACETAMINOPHEN 650 MG RE SUPP: 650.0000 mg | Freq: Four times a day (QID) | RECTAL | Status: DC | PRN

## 2014-08-02 MED ORDER — ZINC OXIDE 11.3 % EX CREA
1.0000 "application " | TOPICAL_CREAM | Freq: Three times a day (TID) | CUTANEOUS | Status: DC | PRN
  Filled 2014-08-02: qty 56

## 2014-08-02 MED ORDER — SODIUM CHLORIDE 0.9 % IV SOLN
INTRAVENOUS | Status: DC
  Administered 2014-08-02 (×2): via INTRAVENOUS
  Administered 2014-08-02: 100 mL via INTRAVENOUS

## 2014-08-02 MED ORDER — BACLOFEN 10 MG PO TABS: 20.0000 mg | ORAL_TABLET | Freq: Three times a day (TID) | ORAL | Status: DC

## 2014-08-02 MED ORDER — DOXYCYCLINE HYCLATE 100 MG PO TABS
100.0000 mg | ORAL_TABLET | Freq: Two times a day (BID) | ORAL | Status: DC
  Administered 2014-08-02 – 2014-08-04 (×6): 100 mg via ORAL
  Filled 2014-08-02 (×7): qty 1

## 2014-08-02 MED ORDER — PANTOPRAZOLE SODIUM 40 MG PO TBEC
40.0000 mg | DELAYED_RELEASE_TABLET | Freq: Every day | ORAL | Status: DC
  Administered 2014-08-02 – 2014-08-03 (×2): 40 mg via ORAL
  Filled 2014-08-02 (×2): qty 1

## 2014-08-02 MED ORDER — FAMOTIDINE 20 MG PO TABS
20.0000 mg | ORAL_TABLET | Freq: Two times a day (BID) | ORAL | Status: DC | PRN
  Administered 2014-08-02 – 2014-08-03 (×2): 20 mg via ORAL
  Filled 2014-08-02 (×3): qty 1

## 2014-08-02 MED ORDER — HYDROCORTISONE NA SUCCINATE PF 100 MG IJ SOLR
100.0000 mg | Freq: Once | INTRAMUSCULAR | Status: AC
  Administered 2014-08-02: 100 mg via INTRAVENOUS
  Filled 2014-08-02: qty 2

## 2014-08-02 MED ORDER — ENOXAPARIN SODIUM 40 MG/0.4ML ~~LOC~~ SOLN
40.0000 mg | SUBCUTANEOUS | Status: DC
  Administered 2014-08-02 – 2014-08-04 (×3): 40 mg via SUBCUTANEOUS
  Filled 2014-08-02 (×3): qty 0.4

## 2014-08-02 MED ORDER — ONDANSETRON HCL 4 MG/2ML IJ SOLN
4.0000 mg | Freq: Four times a day (QID) | INTRAMUSCULAR | Status: DC | PRN
  Administered 2014-08-02: 4 mg via INTRAVENOUS
  Filled 2014-08-02: qty 2

## 2014-08-02 MED ORDER — CEFTRIAXONE SODIUM 1 G IJ SOLR
1.0000 g | Freq: Once | INTRAMUSCULAR | Status: AC
  Administered 2014-08-02: 1 g via INTRAVENOUS
  Filled 2014-08-02: qty 10

## 2014-08-02 MED ORDER — DULOXETINE HCL 20 MG PO CPEP
20.0000 mg | ORAL_CAPSULE | Freq: Two times a day (BID) | ORAL | Status: DC
  Administered 2014-08-02 – 2014-08-04 (×5): 20 mg via ORAL
  Filled 2014-08-02 (×7): qty 1

## 2014-08-02 MED ORDER — GABAPENTIN 300 MG PO CAPS
300.0000 mg | ORAL_CAPSULE | Freq: Three times a day (TID) | ORAL | Status: DC
  Administered 2014-08-02 – 2014-08-04 (×7): 300 mg via ORAL
  Filled 2014-08-02 (×9): qty 1

## 2014-08-02 MED ORDER — COLLAGENASE 250 UNIT/GM EX OINT
TOPICAL_OINTMENT | Freq: Every day | CUTANEOUS | Status: DC
  Administered 2014-08-02 – 2014-08-04 (×3): via TOPICAL
  Filled 2014-08-02: qty 30

## 2014-08-02 MED ORDER — FERROUS SULFATE 325 (65 FE) MG PO TABS
325.0000 mg | ORAL_TABLET | Freq: Three times a day (TID) | ORAL | Status: DC
  Administered 2014-08-02 – 2014-08-04 (×7): 325 mg via ORAL
  Filled 2014-08-02 (×9): qty 1

## 2014-08-02 NOTE — H&P (Signed)
[facility] History and Physical  Angela Page JHE:[id number] DOB: 1970-04-03 DOA: 08/01/2014  Referring physician: Dr. Leonides Schanz. PCP: No PCP Per Patient patient's primary care physician is in [place]. Specialists: None.  Chief Complaint: Low blood pressure.  HPI: Angela Page is a 44 y.o. female with history of quadriparesis, pacemaker placement, asthma and sacral decubitus ulcer was brought to the ER after patient's mother found that patient was hypotensive at home. Patient states she usually runs a blood pressure around 90 systolic but was found to be around 70 systolic. Patient otherwise denies any chest pain shortness of breath nausea vomiting abdominal pain or diarrhea. Patient states 2 weeks ago she had similar episode and was at [facility] and was admitted for UTI. Patient finished her course of antibiotics 4 days ago. Patient is on chronic doxycycline therapy for sacral decubitus ulcer. On exam patient's sacral decubitus ulcer does not look infected. UA shows features consistent with UTI. Patient was given fluid bolus and admitted for further management.   Review of Systems: As presented in the history of presenting illness, rest negative.  Past Medical History  Diagnosis Date  . Asthma   . Pleurisy   . GERD (gastroesophageal reflux disease)    Past Surgical History  Procedure Laterality Date  . Appendectomy    . Cardiac surgery    . I&d extremity  10/28/2011    Procedure: IRRIGATION AND DEBRIDEMENT EXTREMITY;  Surgeon: Tennis Must, MD;  Location: [place];  Service: Orthopedics;  Laterality: Right;  Irrigation and debridement right middle finger   Social History:  reports that she has been smoking Cigarettes.  She has a 25 pack-year smoking history. She does not have any smokeless tobacco history on file. She reports that she drinks alcohol. She reports that she does not use illicit drugs. Where does patient live home. Can patient  participate in ADLs? No.  Allergies  Allergen Reactions  . Erythromycin Nausea And Vomiting  . Nitrofurantoin Monohyd Macro Nausea And Vomiting  . Penicillins Hives    Family History:  Family History  Problem Relation Age of Onset  . Hypertension Maternal Uncle       Prior to Admission medications   Medication Sig Start Date End Date Taking? Authorizing Provider  albuterol (PROVENTIL HFA;VENTOLIN HFA) 108 (90 BASE) MCG/ACT inhaler Inhale 2 puffs into the lungs every 6 (six) hours as needed. For shortness of breath    Yes Historical Provider, MD  aspirin 325 MG EC tablet Take 650 mg by mouth every 4 (four) hours as needed. For pain   Yes Historical Provider, MD  baclofen (LIORESAL) 20 MG tablet Take 20 mg by mouth 3 (three) times daily. 07/04/14  Yes Historical Provider, MD  citalopram (CELEXA) 20 MG tablet Take 20 mg by mouth daily. 07/04/14  Yes Historical Provider, MD  cyclobenzaprine (FLEXERIL) 5 MG tablet Take 5 mg by mouth 3 (three) times daily as needed. For muscle spasms. 07/04/14  Yes Historical Provider, MD  doxycycline (VIBRA-TABS) 100 MG tablet Take 100 mg by mouth every 12 (twelve) hours. 07/04/14  Yes Historical Provider, MD  DULoxetine (CYMBALTA) 20 MG capsule Take 20 mg by mouth 2 (two) times daily. 07/04/14  Yes Historical Provider, MD  ferrous sulfate 325 (65 FE) MG tablet Take 325 mg by mouth 3 (three) times daily. 07/02/14  Yes Historical Provider, MD  gabapentin (NEURONTIN) 300 MG capsule Take 300 mg by mouth 3 (three) times daily. 07/04/14  Yes Historical Provider, MD  nystatin (MYCOSTATIN/NYSTOP) 100000  UNIT/GM POWD Apply 1 g topically 2 (two) times daily as needed (for vaginal yeast/redness).   Yes Historical Provider, MD  oxyCODONE (ROXICODONE) 15 MG immediate release tablet Take 15 mg by mouth every 4 (four) hours as needed. for pain 06/13/14  Yes Historical Provider, MD  sodium hypochlorite (DAKIN'S 1/2 STRENGTH) external solution Apply 1 application topically 2 (two)  times daily. For wounds, if wounds are deeper they are irrigated, when changing dressing.   Yes Historical Provider, MD  vitamin C (ASCORBIC ACID) 500 MG tablet Take 1,000 mg by mouth daily.     Yes Historical Provider, MD  zinc oxide (BALMEX) 11.3 % CREA cream Apply 1 application topically 3 (three) times daily as needed (for skin redness/irritation).   Yes Historical Provider, MD    Physical Exam: Filed Vitals:   08/02/14 0145 08/02/14 0149 08/02/14 0230 08/02/14 0301  BP: 91/48  89/67 97/58  Pulse: 86 81 83 95  Temp:    97.5 F (36.4 C)  TempSrc:    Oral  Resp: 12 16 24 16   Height:      Weight:      SpO2: 88% 91% 91% 92%     General:  Moderately built and poorly nourished.  Eyes: Anicteric no pallor.  ENT: No discharge from the ears eyes nose and mouth.  Neck: No mass felt.  Cardiovascular: S1 and S2 heard.  Respiratory: No rhonchi or crepitations.  Abdomen: Soft nontender bowel sounds present.  Skin: Sacral decubitus ulcer stage IV. No signs of infection.  Musculoskeletal: No edema.  Psychiatric: Appears normal.  Neurologic: Alert awake oriented to time place and person. Patient has quadriparesis.  Labs on Admission:  Basic Metabolic Panel:  Recent Labs Lab 08/02/14 0013 08/02/14 0028  NA 143 141  K 3.8 3.8  CL 108 105  CO2 27  --   GLUCOSE 90 87  BUN 9 7  CREATININE 0.57 0.60  CALCIUM 8.1*  --    Liver Function Tests:  Recent Labs Lab 08/02/14 0013  AST 8*  ALT 6*  ALKPHOS 92  BILITOT 0.1*  PROT 5.2*  ALBUMIN 1.7*   No results for input(s): LIPASE, AMYLASE in the last 168 hours. No results for input(s): AMMONIA in the last 168 hours. CBC:  Recent Labs Lab 08/02/14 0013 08/02/14 0028  WBC 9.6  --   NEUTROABS 6.2  --   HGB 9.2* 9.2*  HCT 28.1* 27.0*  MCV 89.5  --   PLT 515*  --    Cardiac Enzymes: No results for input(s): CKTOTAL, CKMB, CKMBINDEX, TROPONINI in the last 168 hours.  BNP (last 3 results) No results for input(s):  BNP in the last 8760 hours.  ProBNP (last 3 results) No results for input(s): PROBNP in the last 8760 hours.  CBG: No results for input(s): GLUCAP in the last 168 hours.  Radiological Exams on Admission: Dg Chest Port 1 View  08/01/2014   CLINICAL DATA:  44 year old female with hypotension  EXAM: PORTABLE CHEST - 1 VIEW  COMPARISON:  None.  FINDINGS: Single-view of the chest demonstrate clear lungs. No pleural effusion or pneumothorax. Top-normal cardiac silhouette. Left pectoral pacemaker device. No acute fracture. Cervical spine fixation the screws noted.  IMPRESSION: No active disease.   Electronically Signed   By: Anner Crete M.D.   On: 08/01/2014 23:56    EKG: Independently reviewed. Paced rhythm.  Assessment/Plan Principal Problem:   Hypotension Active Problems:   Urinary tract infectious disease   Chronic anemia  1. Hypotension - cause not clear. Patient does not look septic. Patient does have UTI which patient has been started on ceftriaxone. Check cortisol level and continue hydration. 2. UTI - patient has been placed on ceftriaxone and follow urine cultures. 3. Anemia, normocytic normochromic - repeat CBC. Type and screen if patient has further decrease in hemoglobin may need transfusion. 4. Sacral decubitus ulcer stage IV does not look infected. Wound team consult. 5. History of asthma presently not wheezing. 6. Quadriparesis. 7. History of pacemaker placement.   DVT Prophylaxis Lovenox. Code Status: Full code.  Family Communication: Discussed with patient.  Disposition Plan: Admit to inpatient.    Christophor Eick N. [facility] Pager [phone].  If 7PM-7AM, please contact night-coverage [email or web address] Password [facility] 08/02/2014, 3:41 AM

## 2014-08-02 NOTE — Progress Notes (Addendum)
[facility] PROGRESS NOTE  Angela Page OZD:[id number] DOB: [birth year] DOA: 08/01/2014 PCP: No PCP Per Patient  Brief Summary  Angela Page is a 44 y.o. female with history of quadriparesis, pacemaker placement, asthma and sacral decubitus ulcer was brought to the ER after patient's mother found that patient was hypotensive at home. Patient stated she usually runs a blood pressure around 90 systolic but was found to be around 70 systolic. Patient denied chest pain, shortness of breath, nausea, vomiting, abdominal pain or diarrhea. Patient stated 2 weeks ago she had similar episode and was at [facility] for UTI. Patient finished her course of antibiotics 4 days ago. Patient is on chronic doxycycline therapy for sacral decubitus ulcer, however, her ulcer did not appear infected.  UA was consistent with UTI. Patient was given fluid bolus and admitted for further management.   Assessment/Plan  Hypotension, likely a combination of dehydration and UTI.  She has a pacemaker which makes it impossible to mount a heart rate response to her hypotension.  Additionally, her quadriplegia may be causing autonomic instability.  Will f/u cortisol level.  -  Minimize medications which may be contributing to hypotension -  Start midodrine -  Continue IVF:  Bolus 557mL and increase IVF rate to 183ml/h today -  Hydrocortisone 100mg  IV once pending cortisol level -  F/u cortisol level -  [employer] rep contacted for PPM interrogation  UTI -  F/u urine culture -  Continue ceftriaxone  Normocytic anemia, baseline hgb of 15, currently 9.4mg /dl -  Iron studies, B12, folate -  TSH -  Occult stool -  Repeat hgb in AM  Sacral decubitus ulcer stage IV, does not appear infected -  Resume antibiotic suppression at discharge > per primary physician  Asthma, stable on RA.  Continue home medications  Quadriparesis  Hx of pacemaker -  Consider interrogation but telemetry demonstrates stable paced  rhythm   Diet:  regular Access:  PIV IVF:  yes Proph:  lovenox  Code Status: FULL Family Communication: patient alone Disposition Plan: pending improvement in blood pressure   Consultants:  none  Procedures:  CXR  Antibiotics:  Ceftriaxone 7/13   HPI/Subjective:  Denies lightheadedness, nausea, vomiting, blurry vision, new pain (has chronic neck and shoulder pain), chills, subjective fevers  Objective: Filed Vitals:   08/02/14 0600 08/02/14 0630 08/02/14 0700 08/02/14 0715  BP: 104/69 74/52 68/45  64/34  Pulse: 80 80 81 80  Temp:      TempSrc:      Resp: 9 15 12 16   Height:      Weight:      SpO2: 94% 96% 100% 96%    Intake/Output Summary (Last 24 hours) at 08/02/14 0749 Last data filed at 08/02/14 0700  Gross per 24 hour  Intake   5240 ml  Output    500 ml  Net   4740 ml   Filed Weights   08/02/14 0026 08/02/14 0400  Weight: 49.896 kg (110 lb) 49.9 kg (110 lb 0.2 oz)   Body mass index is 19.79 kg/(m^2).  Exam:   General:  Thin F, No acute distress, still appears dry skin/mildly sunken eyes  HEENT:  NCAT, MMM  Cardiovascular:  RRR, nl S1, S2 no mrg, 2+ pulses, warm extremities  Respiratory:  CTAB, no increased WOB  Abdomen:   NABS, soft, mildly distended, nontender  MSK:   Decreased tone and bulk, no LEE.  Contractures bilateral upper extremities and lower extremitites  Neuro:  4/5 shoulders upper extremiites, contractures of  hands.  1/5 lower extremities  Data Reviewed: Basic Metabolic Panel:  Recent Labs Lab 08/02/14 0013 08/02/14 0028 08/02/14 0545  NA 143 141 145  K 3.8 3.8 3.4*  CL 108 105 115*  CO2 27  --  21*  GLUCOSE 90 87 107*  BUN 9 7 7   CREATININE 0.57 0.60 0.51  CALCIUM 8.1*  --  7.6*   Liver Function Tests:  Recent Labs Lab 08/02/14 0013 08/02/14 0545  AST 8* 10*  ALT 6* 6*  ALKPHOS 92 84  BILITOT 0.1* 0.2*  PROT 5.2* 5.0*  ALBUMIN 1.7* 1.7*   No results for input(s): LIPASE, AMYLASE in the last 168  hours. No results for input(s): AMMONIA in the last 168 hours. CBC:  Recent Labs Lab 08/02/14 0013 08/02/14 0028 08/02/14 0545  WBC 9.6  --  9.6  NEUTROABS 6.2  --  6.0  HGB 9.2* 9.2* 9.3*  HCT 28.1* 27.0* 29.6*  MCV 89.5  --  89.7  PLT 515*  --  461*    Recent Results (from the past 240 hour(s))  MRSA PCR Screening     Status: None   Collection Time: 08/02/14  5:20 AM  Result Value Ref Range Status   MRSA by PCR NEGATIVE NEGATIVE Final    Comment:        The GeneXpert MRSA Assay (FDA approved for NASAL specimens only), is one component of a comprehensive MRSA colonization surveillance program. It is not intended to diagnose MRSA infection nor to guide or monitor treatment for MRSA infections.      Studies: Dg Chest Port 1 View  08/01/2014   CLINICAL DATA:  44 year old female with hypotension  EXAM: PORTABLE CHEST - 1 VIEW  COMPARISON:  None.  FINDINGS: Single-view of the chest demonstrate clear lungs. No pleural effusion or pneumothorax. Top-normal cardiac silhouette. Left pectoral pacemaker device. No acute fracture. Cervical spine fixation the screws noted.  IMPRESSION: No active disease.   Electronically Signed   By: Anner Crete M.D.   On: 08/01/2014 23:56    Scheduled Meds: . baclofen  20 mg Oral TID  . cefTRIAXone (ROCEPHIN)  IV  1 g Intravenous Q24H  . citalopram  20 mg Oral Daily  . doxycycline  100 mg Oral Q12H  . DULoxetine  20 mg Oral BID  . enoxaparin (LOVENOX) injection  40 mg Subcutaneous Q24H  . ferrous sulfate  325 mg Oral TID  . gabapentin  300 mg Oral TID  . hydrocortisone sod succinate (SOLU-CORTEF) inj  100 mg Intravenous Once  . midodrine  10 mg Oral TID WC  . pantoprazole  40 mg Oral Daily  . potassium chloride  40 mEq Oral Once   Continuous Infusions: . sodium chloride 100 mL (08/02/14 0351)    Principal Problem:   Hypotension Active Problems:   Urinary tract infectious disease   Chronic anemia   Pressure ulcer    Time  spent: 30 min    Angela Page, [place] [facility] Pager [phone]. If 7PM-7AM, please contact night-coverage at [email or web address], password [facility] 08/02/2014, 7:49 AM  LOS: 0 days

## 2014-08-02 NOTE — Consult Note (Signed)
WOC wound consult note Reason for Consult:Stage IV pressure ulcer to sacrum, present on admission.  Unstageable ulcer to upper buttocks, left side Nonblanchable redness to bilateral heels.  Wound type:Pressure ulcer  Present on admission  Pressure Ulcer POA: Yes Measurement:Sacrum 6 cm x 5.5 cm x 3 cm  Left buttocks, near the apex of the gluteal fold  1 cm x 1 cm 100% slough Intact heels, nonblanchable redness Wound MEQ:ASTMHD 100% pale pink nongranulating tissue.  Was receiving NPWT (VAC) therapy and wound care MD at [facility] changed her orders to Dakin's solution (3 weeks ago) No devitalized tissue noted at this time and no odor.  Buttocks wound is 100% adherent slough and will begin Santyl cream to both wounds.  Drainage (amount, consistency, odor) Moderate serosanguinous drainage.  No odor.  Periwound:Intact  1 cm x 1 cm x 0.1 cm medical adhesive related skin injury (MARSI) to sacral periwound skin.  Dressing procedure/placement/frequency:Cleanse sacral and buttock wound with NS and pat gently dry.  Apply Santyl cream to both wound beds.  Cover with NS moist dressing.  Secure with ABD pad and tape.  Change daily.  Offload pressure to heels with a pillow under the calf.  Will not follow at this time.  Please re-consult if needed.  Domenic Moras RN BSN [place] Pager [phone]

## 2014-08-02 NOTE — Progress Notes (Signed)
ANTIBIOTIC CONSULT NOTE - INITIAL  Pharmacy Consult for Ceftriaxone Indication: UTI  Allergies  Allergen Reactions  . Erythromycin Nausea And Vomiting  . Nitrofurantoin Monohyd Macro Nausea And Vomiting  . Penicillins Hives    Patient Measurements: Height: 5' 2.5" (158.8 cm) Weight: 110 lb 0.2 oz (49.9 kg) IBW/kg (Calculated) : 51.25 Adjusted Body Weight:   Vital Signs: Temp: 97.5 F (36.4 C) (07/13 0301) Temp Source: Oral (07/13 0301) BP: 79/48 mmHg (07/13 0405) Pulse Rate: 80 (07/13 0405) Intake/Output from previous day: 07/12 0701 - 07/13 0700 In: 4320 [P.O.:120; I.V.:200; IV Piggyback:4000] Out: -  Intake/Output from this shift: Total I/O In: 4320 [P.O.:120; I.V.:200; IV Piggyback:4000] Out: -   Labs:  Recent Labs  08/02/14 0013 08/02/14 0028  WBC 9.6  --   HGB 9.2* 9.2*  PLT 515*  --   CREATININE 0.57 0.60   Estimated Creatinine Clearance: 71.4 mL/min (by C-G formula based on Cr of 0.6). No results for input(s): VANCOTROUGH, VANCOPEAK, VANCORANDOM, GENTTROUGH, GENTPEAK, GENTRANDOM, TOBRATROUGH, TOBRAPEAK, TOBRARND, AMIKACINPEAK, AMIKACINTROU, AMIKACIN in the last 72 hours.   Microbiology: No results found for this or any previous visit (from the past 720 hour(s)).  Medical History: Past Medical History  Diagnosis Date  . Asthma   . Pleurisy   . GERD (gastroesophageal reflux disease)     Medications:  Anti-infectives    Start     Dose/Rate Route Frequency Ordered Stop   08/02/14 2200  cefTRIAXone (ROCEPHIN) 1 g in dextrose 5 % 50 mL IVPB     1 g 100 mL/hr over 30 Minutes Intravenous Every 24 hours 08/02/14 0347     08/02/14 0345  doxycycline (VIBRA-TABS) tablet 100 mg     100 mg Oral Every 12 hours 08/02/14 0340     08/02/14 0230  cefTRIAXone (ROCEPHIN) 1 g in dextrose 5 % 50 mL IVPB     1 g 100 mL/hr over 30 Minutes Intravenous  Once 08/02/14 0218 08/02/14 0303     Assessment: Patient with UTI.  Goal of Therapy:  Rocephin based on  manufacturer dosing recommendations.  Plan: Ceftriaxone 1gm iv q24hr Follow up culture results  Nani Skillern Crowford 08/02/2014,5:07 AM

## 2014-08-02 NOTE — ED Notes (Signed)
Patient given a sandwich and a coke per md

## 2014-08-02 NOTE — Progress Notes (Signed)
Pt's SBP in the 60s. Pt is alert and oriented and seems asymptomatic. MD is notified.

## 2014-08-03 DIAGNOSIS — D649 Anemia, unspecified: Secondary | ICD-10-CM

## 2014-08-03 DIAGNOSIS — L8990 Pressure ulcer of unspecified site, unspecified stage: Secondary | ICD-10-CM

## 2014-08-03 DIAGNOSIS — N390 Urinary tract infection, site not specified: Secondary | ICD-10-CM

## 2014-08-03 LAB — URINE CULTURE

## 2014-08-03 LAB — CBC
HCT: 33 % — ABNORMAL LOW (ref 36.0–46.0)
Hemoglobin: 10.1 g/dL — ABNORMAL LOW (ref 12.0–15.0)
MCH: 27.7 pg (ref 26.0–34.0)
MCHC: 30.6 g/dL (ref 30.0–36.0)
MCV: 90.7 fL (ref 78.0–100.0)
Platelets: 589 10*3/uL — ABNORMAL HIGH (ref 150–400)
RBC: 3.64 MIL/uL — ABNORMAL LOW (ref 3.87–5.11)
RDW: 15.6 % — ABNORMAL HIGH (ref 11.5–15.5)
WBC: 10.6 10*3/uL — ABNORMAL HIGH (ref 4.0–10.5)

## 2014-08-03 LAB — BASIC METABOLIC PANEL
ANION GAP: 6 (ref 5–15)
BUN: 6 mg/dL (ref 6–20)
CHLORIDE: 113 mmol/L — AB (ref 101–111)
CO2: 21 mmol/L — AB (ref 22–32)
CREATININE: 0.45 mg/dL (ref 0.44–1.00)
Calcium: 8.1 mg/dL — ABNORMAL LOW (ref 8.9–10.3)
GFR calc Af Amer: >60 mL/min (ref >60)
GFR calc non Af Amer: >60 mL/min (ref >60)
Glucose, Bld: 82 mg/dL (ref 65–99)
POTASSIUM: 3.9 mmol/L (ref 3.5–5.1)
Sodium: 140 mmol/L (ref 135–145)

## 2014-08-03 MED ORDER — GI COCKTAIL ~~LOC~~
30.0000 mL | Freq: Three times a day (TID) | ORAL | Status: DC | PRN
  Administered 2014-08-03 – 2014-08-04 (×2): 30 mL via ORAL
  Filled 2014-08-03 (×4): qty 30

## 2014-08-03 MED ORDER — PANTOPRAZOLE SODIUM 40 MG PO TBEC
40.0000 mg | DELAYED_RELEASE_TABLET | Freq: Two times a day (BID) | ORAL | Status: DC
  Administered 2014-08-03 – 2014-08-04 (×2): 40 mg via ORAL
  Filled 2014-08-03 (×3): qty 1

## 2014-08-03 MED ORDER — MIDODRINE HCL 5 MG PO TABS
5.0000 mg | ORAL_TABLET | Freq: Three times a day (TID) | ORAL | Status: DC
  Administered 2014-08-03: 5 mg via ORAL
  Filled 2014-08-03 (×4): qty 1

## 2014-08-03 NOTE — Progress Notes (Signed)
[facility] PROGRESS NOTE  Angela Page [id number]:[id number] DOB: April 19, 1970 DOA: 08/01/2014 PCP: No PCP Per Patient  Brief Summary  Angela Page is a 44 y.o. female with history of quadriparesis, pacemaker placement, asthma and sacral decubitus ulcer was brought to the ER after patient's mother found that patient was hypotensive at home. Patient stated she usually runs a blood pressure around 90 systolic but was found to be around 70 systolic. Patient denied chest pain, shortness of breath, nausea, vomiting, abdominal pain or diarrhea. Patient stated 2 weeks ago she had similar episode and was at [facility] for UTI. Patient finished her course of antibiotics 4 days ago. Patient is on chronic doxycycline therapy for sacral decubitus ulcer, however, her ulcer did not appear infected.  UA was consistent with UTI. Patient was given fluid bolus and admitted for further management.   Assessment/Plan  Hypotension, likely a combination of dehydration and UTI.  She has a pacemaker which makes it impossible to mount a heart rate response to her hypotension.  Additionally, her quadriplegia may be causing autonomic instability.  BP improved today. -  D/c midodrine -  Cortisol level 6, may have some relative adrenal insufficiency.  No further testing now given recent hydrocortisone dose but recommend repeat testing as outpatient -  givne Hydrocortisone 100mg  IV once on 7/13 -  [employer] rep:  PPM working appropriately -  Continue tx for UTI  UTI -  F/u urine culture -  Continue ceftriaxone  Normocytic anemia c/w iron deficiency, baseline hgb of 15, currently 9.4mg /dl -  Iron studies consistent with iron deficiency, B12 267, folate 6.4 -  TSH 2.643 -  Occult stool not yet obtained -  Hemoglobin stable  -  Start iron supplementation  Sacral decubitus ulcer stage IV, does not appear infected -  Resume antibiotic suppression at discharge > per primary physician  -  Appreciate wound care  consultation, Santyl with wet-to-dry dressing  Asthma, stable on RA.  Continue home medications  Quadriparesis, stable  Pacemaker interrogated and working appropriately  GERD, with heartburn.  Increase PPI to twice a day, continue famotidine. Add GI cocktail when necessary.  Diet:  regular Access:  PIV IVF:  Off Proph:  lovenox  Code Status: FULL Family Communication: patient alone Disposition Plan:  Anticipate discharge tomorrow if blood pressure is stable off midodrine   Consultants:  none  Procedures:  CXR  Antibiotics:  Ceftriaxone 7/13   HPI/Subjective:  Patient denies new pains, shortness of breath, nausea, diarrhea. She feels much better today.  Objective: Filed Vitals:   08/03/14 0900 08/03/14 1000 08/03/14 1100 08/03/14 1151  BP: 126/84 118/78 153/95 129/97  Pulse: 80 80 80 80  Temp:      TempSrc:      Resp: 10 11 17 12   Height:      Weight:      SpO2: 98% 98% 99% 98%    Intake/Output Summary (Last 24 hours) at 08/03/14 1153 Last data filed at 08/03/14 0825  Gross per 24 hour  Intake   3120 ml  Output   1275 ml  Net   1845 ml   Filed Weights   08/02/14 0026 08/02/14 0400  Weight: 49.896 kg (110 lb) 49.9 kg (110 lb 0.2 oz)   Body mass index is 19.79 kg/(m^2).  Exam:   General:  Thin F, No acute distress, color in her cheeks and appears better hydrated today   HEENT:  NCAT, MMM  Cardiovascular:  RRR, nl S1, S2 no mrg, 2+ pulses,  warm extremities  Respiratory:  CTAB, no increased WOB  Abdomen:   NABS, soft, mildly distended, nontender  MSK:   Decreased tone and 1+ pitting bilateral upper and lower extremity edema.  Contractures bilateral upper extremities and lower extremitites  Neuro:  4/5 shoulders upper extremiites, contractures of hands.  1/5 lower extremities  Data Reviewed: Basic Metabolic Panel:  Recent Labs Lab 08/02/14 0013 08/02/14 0028 08/02/14 0545 08/03/14 0350  NA 143 141 145 140  K 3.8 3.8 3.4* 3.9  CL 108  105 115* 113*  CO2 27  --  21* 21*  GLUCOSE 90 87 107* 82  BUN 9 7 7 6   CREATININE 0.57 0.60 0.51 0.45  CALCIUM 8.1*  --  7.6* 8.1*   Liver Function Tests:  Recent Labs Lab 08/02/14 0013 08/02/14 0545  AST 8* 10*  ALT 6* 6*  ALKPHOS 92 84  BILITOT 0.1* 0.2*  PROT 5.2* 5.0*  ALBUMIN 1.7* 1.7*   No results for input(s): LIPASE, AMYLASE in the last 168 hours. No results for input(s): AMMONIA in the last 168 hours. CBC:  Recent Labs Lab 08/02/14 0013 08/02/14 0028 08/02/14 0545 08/03/14 0350  WBC 9.6  --  9.6 10.6*  NEUTROABS 6.2  --  6.0  --   HGB 9.2* 9.2* 9.3* 10.1*  HCT 28.1* 27.0* 29.6* 33.0*  MCV 89.5  --  89.7 90.7  PLT 515*  --  461* 589*    Recent Results (from the past 240 hour(s))  MRSA PCR Screening     Status: None   Collection Time: 08/02/14  5:20 AM  Result Value Ref Range Status   MRSA by PCR NEGATIVE NEGATIVE Final    Comment:        The GeneXpert MRSA Assay (FDA approved for NASAL specimens only), is one component of a comprehensive MRSA colonization surveillance program. It is not intended to diagnose MRSA infection nor to guide or monitor treatment for MRSA infections.      Studies: Dg Chest Port 1 View  08/01/2014   CLINICAL DATA:  44 year old female with hypotension  EXAM: PORTABLE CHEST - 1 VIEW  COMPARISON:  None.  FINDINGS: Single-view of the chest demonstrate clear lungs. No pleural effusion or pneumothorax. Top-normal cardiac silhouette. Left pectoral pacemaker device. No acute fracture. Cervical spine fixation the screws noted.  IMPRESSION: No active disease.   Electronically Signed   By: Anner Crete M.D.   On: 08/01/2014 23:56    Scheduled Meds: . baclofen  20 mg Oral TID  . cefTRIAXone (ROCEPHIN)  IV  1 g Intravenous Q24H  . citalopram  20 mg Oral Daily  . collagenase   Topical Daily  . doxycycline  100 mg Oral Q12H  . DULoxetine  20 mg Oral BID  . enoxaparin (LOVENOX) injection  40 mg Subcutaneous Q24H  . ferrous  sulfate  325 mg Oral TID  . gabapentin  300 mg Oral TID  . pantoprazole  40 mg Oral BID AC   Continuous Infusions:    Principal Problem:   Hypotension Active Problems:   Urinary tract infectious disease   Chronic anemia   Pressure ulcer    Time spent: 30 min    Sarae Nicholes, [place] [facility] Pager [phone]. If 7PM-7AM, please contact night-coverage at [email or web address], password [email or web address] 08/03/2014, 11:53 AM  LOS: 1 day

## 2014-08-03 NOTE — Clinical Documentation Improvement (Signed)
Potassium 3.4 on 08/02/2014.  Potassium chloride SA 40 mEq once ordered 08/02/2014.  Please identify any clinical conditions associated with the abnormal potassium value and document in your future progress notes and discharge summary.   Possible Clinical Conditions: -Hypokalemia -Other condition (please specify) -Unable to determine at present  Thank you, Mateo Flow, RN [phone] Clinical Documentation Specialist

## 2014-08-03 NOTE — Care Management Note (Signed)
Case Management Note  Patient Details  Name: [name] MRN: [id number] Date of Birth: [birth year]  Subjective/Objective:             44 yo admitted with Hypotension       Action/Plan: From home with parent and [facility] services  Expected Discharge Date:                  Expected Discharge Plan:  Welcome  In-House Referral:     Discharge planning Services  CM Consult  Post Acute Care Choice:    Choice offered to:     DME Arranged:    DME Agency:     HH Arranged:  RN, Nurse's Aide [place] Agency:  [place]  Status of Service:     Medicare Important Message Given:    Date Medicare IM Given:    Medicare IM give by:    Date Additional Medicare IM Given:    Additional Medicare Important Message give by:     If discussed at [place] of Stay Meetings, dates discussed:    Additional Comments: Per [facility] representative pt is currently active with [facility] for [facility] services. Will need resumption orders at DC. CM will continue to follow. Lynnell Catalan, RN 08/03/2014, 3:39 PM

## 2014-08-03 NOTE — Progress Notes (Signed)
Pt arrived to the floor. Vital signs stable. Pt A-paced on telemetry. Pt c/o 7/10 chronic neck pain. PRN pain medication given. Agree with previous assessment. Will continue to monitor pt.   Othella Boyer [facility] 08/03/2014 5:25 PM

## 2014-08-03 NOTE — Clinical Documentation Improvement (Signed)
Current documentation reflects UA consistent with UTI, recent prior admission at outside facility for UTI.  ED note states patient with chronic indwelling foley catheter. Please clarify if the patient's chronic indwelling foley and UTI are related.  (A cause and effect relationship may not be assumed and must be documented by a provider.)  Are the conditions: -Due to or associated with each other (device related UTI) -Unrelated to each other -Unable to determine -Unknown  Thank you, Mateo Flow, RN [phone] Clinical Documentation Specialist

## 2014-08-04 DIAGNOSIS — I951 Orthostatic hypotension: Secondary | ICD-10-CM

## 2014-08-04 DIAGNOSIS — G903 Multi-system degeneration of the autonomic nervous system: Secondary | ICD-10-CM

## 2014-08-04 DIAGNOSIS — B3749 Other urogenital candidiasis: Secondary | ICD-10-CM

## 2014-08-04 LAB — BASIC METABOLIC PANEL
Anion gap: 3 — ABNORMAL LOW (ref 5–15)
BUN: 7 mg/dL (ref 6–20)
CO2: 25 mmol/L (ref 22–32)
Calcium: 8.2 mg/dL — ABNORMAL LOW (ref 8.9–10.3)
Chloride: 109 mmol/L (ref 101–111)
Creatinine, Ser: 0.68 mg/dL (ref 0.44–1.00)
GFR calc Af Amer: >60 mL/min (ref >60)
GFR calc non Af Amer: >60 mL/min (ref >60)
Glucose, Bld: 80 mg/dL (ref 65–99)
Potassium: 3.7 mmol/L (ref 3.5–5.1)
Sodium: 137 mmol/L (ref 135–145)

## 2014-08-04 LAB — CBC
HCT: 29.6 % — ABNORMAL LOW (ref 36.0–46.0)
Hemoglobin: 9.4 g/dL — ABNORMAL LOW (ref 12.0–15.0)
MCH: 28.1 pg (ref 26.0–34.0)
MCHC: 31.8 g/dL (ref 30.0–36.0)
MCV: 88.6 fL (ref 78.0–100.0)
Platelets: 473 10*3/uL — ABNORMAL HIGH (ref 150–400)
RBC: 3.34 MIL/uL — ABNORMAL LOW (ref 3.87–5.11)
RDW: 15.5 % (ref 11.5–15.5)
WBC: 9.9 10*3/uL (ref 4.0–10.5)

## 2014-08-04 MED ORDER — FERROUS SULFATE 325 (65 FE) MG PO TABS: 325.0000 mg | ORAL_TABLET | Freq: Three times a day (TID) | ORAL | Status: DC

## 2014-08-04 MED ORDER — FLUCONAZOLE 200 MG PO TABS: 200.0000 mg | ORAL_TABLET | Freq: Every day | ORAL | Status: DC

## 2014-08-04 MED ORDER — FLUCONAZOLE 200 MG PO TABS
200.0000 mg | ORAL_TABLET | Freq: Every day | ORAL | Status: DC
  Administered 2014-08-04: 200 mg via ORAL
  Filled 2014-08-04: qty 1

## 2014-08-04 MED ORDER — OMEPRAZOLE 20 MG PO CPDR: 20.0000 mg | DELAYED_RELEASE_CAPSULE | Freq: Every day | ORAL | Status: DC

## 2014-08-04 NOTE — Progress Notes (Signed)
Pt discharged home with belongings, IVs and tele removed. AVS given, discharge instructions explained and teach back performed. Pt transported home by ambulance. Pt stable at time of discharge.

## 2014-08-04 NOTE — Discharge Summary (Signed)
Physician Discharge Summary  Angela Page ENI:[id number] DOB: 02-03-1970 DOA: 08/01/2014  PCP: No PCP Per Angela Page  Admit date: 08/01/2014 Discharge date: 08/04/2014  Recommendations for Outpatient Follow-up:  1. F/u with primary care doctor in 2 weeks for reevaluation.  Repeat CBC and BMP to f/u anemia, thrombocytosis and potassium/dehydration.  Please continue to address antibiotics (doxy for chronic suppression).  Please consider outpatient cosyntropin test.   2. Fluconazole through 7/25, then stop  Discharge Diagnoses:  Principal Problem:   Hypotension Active Problems:   Urinary tract infectious disease   Chronic anemia   Pressure ulcer   Discharge Condition: stable, improved  Diet recommendation: regular  Wt Readings from Last 3 Encounters:  08/02/14 49.9 kg (110 lb 0.2 oz)  09/16/13 46.72 kg (103 lb)  03/31/13 48.081 kg (106 lb)    History of present illness:   Angela Page is a 44 y.o. female with history of quadriparesis, pacemaker placement, asthma and sacral decubitus ulcer was brought to the ER after Angela Page's mother found that Angela Page was hypotensive at home. Angela Page stated she usually runs a blood pressure around 90 systolic but was found to be around 70 systolic. Angela Page denied chest pain, shortness of breath, nausea, vomiting, abdominal pain or diarrhea. Angela Page stated 2 weeks ago she had similar episode and was at [facility] for UTI. Angela Page finished her course of antibiotics 4 days ago. Angela Page is on chronic doxycycline therapy for sacral decubitus ulcer, however, her ulcer did not appear infected. UA was consistent with UTI with large LE and 7-10 WBC. Angela Page was given fluid bolus and admitted for further management.   Hospital Course:   Hypotension, likely a combination of dehydration/hypovolemia and UTI, and possible adrenal insufficiency. She is mostly pacemaker dependent which makes it impossible to mount a heart rate response to hypotension.  Additionally, her quadriplegia probably causes autonomic instability. She improved clinically with IV fluids.  She was transiently started on midodrine, but this was able to be tapered off within 2 days of admission. Her cortisol level during an episode of hypotension with 6 suggesting she may have some relative adrenal insufficiency. She was given a dose of hydrocortisone once after her cortisol level was drawn but before was reported. Unfortunately, she was therefore unable to undergo confirmatory cosyntropin test.  This can be done as an outpatient. Her pacemaker was interrogated and was working properly. Her antibiotics for urinary tract infection were continued until her urine culture was reported, then discontinued because her urine culture grew yeast and no bacteria. The case was discussed with infectious disease, Dr. Megan Salon, who recommended a 10 day course of fluconazole given her indwelling urinary catheter. Her urinary catheter was exchanged during this hospitalization.  Her blood pressure was stable from within 24 hours prior to discharge.  Yeast UTI.  Recommend a 10 day course of fluconazole, first day on 7/15. During hospitalization she was empirically treated with ceftriaxone.  Normocytic anemia c/w iron deficiency, baseline hgb of 15, currently 9.4mg /dl.   Iron studies consistent with iron deficiency, B12 267, folate 6.4.  TSH 2.643.  Started iron supplementation.  Occult stool as outpatient and possible GI referral if possible.  Defer to PCP.  She probably also has a component of chronic disease due to her chronic sacral decub ulcer.    Sacral decubitus ulcer stage IV, did not appear infected.  Resumed antibiotic suppression at discharge, defer to primary physician.  Appreciate wound care consultation who recommended santyl with wet-to-dry dressing.    Asthma, stable on RA.  Continued home medications  Quadriparesis, stable  Pacemaker interrogated and working appropriately  GERD,  with heartburn. Increased PPI to twice a day, continued famotidine and added GI cocktail when necessary.   Consultants:  Infectious disease, Dr. Megan Salon, by phone  Procedures:  CXR  Antibiotics:  Ceftriaxone 7/13 > 7/15  Discharge Exam: Filed Vitals:   08/04/14 0518  BP: 102/75  Pulse: 80  Temp: 98.2 F (36.8 C)  Resp: 18   Filed Vitals:   08/03/14 1600 08/03/14 1706 08/03/14 2152 08/04/14 0518  BP: 128/91 113/86 126/88 102/75  Pulse: 79 80 79 80  Temp:  97.9 F (36.6 C) 98.1 F (36.7 C) 98.2 F (36.8 C)  TempSrc:  Oral Oral Oral  Resp: 23 16 16 18   Height:      Weight:      SpO2: 98% 96% 92% 97%     General: Thin F, No acute distress, color in her cheeks, well-appearing  HEENT: NCAT, MMM  Cardiovascular: RRR, nl S1, S2 no mrg, 2+ pulses, warm extremities  Respiratory: CTAB, no increased WOB  Abdomen: NABS, soft, mildly distended, nontender  MSK: Decreased tone and 1+ pitting bilateral upper and lower extremity edema. Contractures bilateral upper extremities and lower extremitites  Neuro: 4/5 shoulders upper extremiites, contractures of hands. 1/5 lower extremities  Discharge Instructions      Discharge Instructions    Call MD for:  difficulty breathing, headache or visual disturbances    Complete by:  As directed      Call MD for:  extreme fatigue    Complete by:  As directed      Call MD for:  hives    Complete by:  As directed      Call MD for:  persistant dizziness or light-headedness    Complete by:  As directed      Call MD for:  persistant nausea and vomiting    Complete by:  As directed      Call MD for:  severe uncontrolled pain    Complete by:  As directed      Call MD for:  temperature >100.4    Complete by:  As directed      Diet general    Complete by:  As directed      Discharge instructions    Complete by:  As directed   Please take fluconazole for the next 10 days, then stop.  Please follow up with your primary  care doctor in 1-2 weeks to repeat blood work and address your heart burn further if needed.  Please stay hydrated by drinking at least 1.5L of water, juice, or other beverages (not soda) per day.     Discharge wound care:    Complete by:  As directed   Continue your previous management of your sacral ulcer with dakin's and wet to dry dressings.     Increase activity slowly    Complete by:  As directed             Medication List    STOP taking these medications        cyclobenzaprine 5 MG tablet  Commonly known as:  FLEXERIL     vitamin C 500 MG tablet  Commonly known as:  ASCORBIC ACID      TAKE these medications        albuterol 108 (90 BASE) MCG/ACT inhaler  Commonly known as:  PROVENTIL HFA;VENTOLIN HFA  Inhale 2 puffs into the lungs every 6 (six) hours as  needed. For shortness of breath     aspirin 325 MG EC tablet  Take 650 mg by mouth every 4 (four) hours as needed. For pain     baclofen 20 MG tablet  Commonly known as:  LIORESAL  Take 20 mg by mouth 3 (three) times daily.     citalopram 20 MG tablet  Commonly known as:  CELEXA  Take 20 mg by mouth daily.     doxycycline 100 MG tablet  Commonly known as:  VIBRA-TABS  Take 100 mg by mouth every 12 (twelve) hours.     DULoxetine 20 MG capsule  Commonly known as:  CYMBALTA  Take 20 mg by mouth 2 (two) times daily.     ferrous sulfate 325 (65 FE) MG tablet  Take 325 mg by mouth 3 (three) times daily.     fluconazole 200 MG tablet  Commonly known as:  DIFLUCAN  Take 1 tablet (200 mg total) by mouth daily.     gabapentin 300 MG capsule  Commonly known as:  NEURONTIN  Take 300 mg by mouth 3 (three) times daily.     nystatin 100000 UNIT/GM Powd  Apply 1 g topically 2 (two) times daily as needed (for vaginal yeast/redness).     omeprazole 20 MG capsule  Commonly known as:  PRILOSEC  Take 1 capsule (20 mg total) by mouth daily.     oxyCODONE 15 MG immediate release tablet  Commonly known as:  ROXICODONE   Take 15 mg by mouth every 4 (four) hours as needed. for pain     sodium hypochlorite external solution  Commonly known as:  DAKIN'S 1/2 STRENGTH  Apply 1 application topically 2 (two) times daily. For wounds, if wounds are deeper they are irrigated, when changing dressing.     zinc oxide 11.3 % Crea cream  Commonly known as:  BALMEX  Apply 1 application topically 3 (three) times daily as needed (for skin redness/irritation).          The results of significant diagnostics from this hospitalization (including imaging, microbiology, ancillary and laboratory) are listed below for reference.    Significant Diagnostic Studies: Dg Chest Port 1 View  08/01/2014   CLINICAL DATA:  44 year old female with hypotension  EXAM: PORTABLE CHEST - 1 VIEW  COMPARISON:  None.  FINDINGS: Single-view of the chest demonstrate clear lungs. No pleural effusion or pneumothorax. Top-normal cardiac silhouette. Left pectoral pacemaker device. No acute fracture. Cervical spine fixation the screws noted.  IMPRESSION: No active disease.   Electronically Signed   By: Anner Crete M.D.   On: 08/01/2014 23:56    Microbiology: Recent Results (from the past 240 hour(s))  Blood culture (routine x 2)     Status: None (Preliminary result)   Collection Time: 08/02/14 12:17 AM  Result Value Ref Range Status   Specimen Description BLOOD RIGHT HAND  Final   Special Requests BOTTLES DRAWN AEROBIC AND ANAEROBIC 5CC  Final   Culture   Final    NO GROWTH 1 DAY Performed at [facility]    Report Status PENDING  Incomplete  Blood culture (routine x 2)     Status: None (Preliminary result)   Collection Time: 08/02/14 12:18 AM  Result Value Ref Range Status   Specimen Description BLOOD LEFT HAND  Final   Special Requests BOTTLES DRAWN AEROBIC AND ANAEROBIC 5ML  Final   Culture   Final    NO GROWTH 1 DAY Performed at [facility]  Report Status PENDING  Incomplete  Urine culture     Status: None    Collection Time: 08/02/14 12:31 AM  Result Value Ref Range Status   Specimen Description URINE, CATHETERIZED  Final   Special Requests NONE  Final   Culture   Final    >=100,000 COLONIES/mL YEAST Performed at [facility]    Report Status 08/03/2014 FINAL  Final  MRSA PCR Screening     Status: None   Collection Time: 08/02/14  5:20 AM  Result Value Ref Range Status   MRSA by PCR NEGATIVE NEGATIVE Final    Comment:        The GeneXpert MRSA Assay (FDA approved for NASAL specimens only), is one component of a comprehensive MRSA colonization surveillance program. It is not intended to diagnose MRSA infection nor to guide or monitor treatment for MRSA infections.      Labs: Basic Metabolic Panel:  Recent Labs Lab 08/02/14 0013 08/02/14 0028 08/02/14 0545 08/03/14 0350 08/04/14 0503  NA 143 141 145 140 137  K 3.8 3.8 3.4* 3.9 3.7  CL 108 105 115* 113* 109  CO2 27  --  21* 21* 25  GLUCOSE 90 87 107* 82 80  BUN 9 7 7 6 7   CREATININE 0.57 0.60 0.51 0.45 0.68  CALCIUM 8.1*  --  7.6* 8.1* 8.2*   Liver Function Tests:  Recent Labs Lab 08/02/14 0013 08/02/14 0545  AST 8* 10*  ALT 6* 6*  ALKPHOS 92 84  BILITOT 0.1* 0.2*  PROT 5.2* 5.0*  ALBUMIN 1.7* 1.7*   No results for input(s): LIPASE, AMYLASE in the last 168 hours. No results for input(s): AMMONIA in the last 168 hours. CBC:  Recent Labs Lab 08/02/14 0013 08/02/14 0028 08/02/14 0545 08/03/14 0350 08/04/14 0503  WBC 9.6  --  9.6 10.6* 9.9  NEUTROABS 6.2  --  6.0  --   --   HGB 9.2* 9.2* 9.3* 10.1* 9.4*  HCT 28.1* 27.0* 29.6* 33.0* 29.6*  MCV 89.5  --  89.7 90.7 88.6  PLT 515*  --  461* 589* 473*   Cardiac Enzymes:  Recent Labs Lab 08/02/14 0545  TROPONINI <0.03   BNP: BNP (last 3 results) No results for input(s): BNP in the last 8760 hours.  ProBNP (last 3 results) No results for input(s): PROBNP in the last 8760 hours.  CBG: No results for input(s): GLUCAP in the last 168  hours.  Time coordinating discharge: 35 minutes  Signed:  Ariya Bohannon  [facility] 08/04/2014, 1:37 PM

## 2014-08-04 NOTE — Progress Notes (Signed)
CSW consulted for transportation needs. Patient will need non-emergency ambulance transport home. CSW confirmed home address with patient.   [facility] called for transport.   No other CSW needs identified - CSW signing off.   Araceli Coufal, LCSW [facility] Clinical Social Worker cell #: [phone]     

## 2014-08-07 LAB — CULTURE, BLOOD (ROUTINE X 2)
CULTURE: NO GROWTH
Culture: NO GROWTH

## 2014-09-02 ENCOUNTER — Inpatient Hospital Stay (HOSPITAL_COMMUNITY)
Admission: EM | Admit: 2014-09-02 | Discharge: 2014-09-06 | DRG: 917 | Disposition: A | Discharge status: Home Health | Payer: Medicaid Other | Attending: Internal Medicine | Admitting: Internal Medicine

## 2014-09-02 ENCOUNTER — Emergency Department (HOSPITAL_COMMUNITY): Payer: Medicaid Other

## 2014-09-02 ENCOUNTER — Encounter (HOSPITAL_COMMUNITY): Payer: Self-pay | Admitting: Oncology

## 2014-09-02 DIAGNOSIS — L89154 Pressure ulcer of sacral region, stage 4: Secondary | ICD-10-CM | POA: Diagnosis present

## 2014-09-02 DIAGNOSIS — Z880 Allergy status to penicillin: Secondary | ICD-10-CM | POA: Diagnosis not present

## 2014-09-02 DIAGNOSIS — A419 Sepsis, unspecified organism: Secondary | ICD-10-CM | POA: Diagnosis present

## 2014-09-02 DIAGNOSIS — F329 Major depressive disorder, single episode, unspecified: Secondary | ICD-10-CM | POA: Diagnosis present

## 2014-09-02 DIAGNOSIS — Z79891 Long term (current) use of opiate analgesic: Secondary | ICD-10-CM | POA: Diagnosis not present

## 2014-09-02 DIAGNOSIS — I9589 Other hypotension: Secondary | ICD-10-CM | POA: Diagnosis present

## 2014-09-02 DIAGNOSIS — G9340 Encephalopathy, unspecified: Secondary | ICD-10-CM | POA: Diagnosis present

## 2014-09-02 DIAGNOSIS — E43 Unspecified severe protein-calorie malnutrition: Secondary | ICD-10-CM | POA: Diagnosis present

## 2014-09-02 DIAGNOSIS — T402X1A Poisoning by other opioids, accidental (unintentional), initial encounter: Principal | ICD-10-CM | POA: Diagnosis present

## 2014-09-02 DIAGNOSIS — J45909 Unspecified asthma, uncomplicated: Secondary | ICD-10-CM | POA: Diagnosis present

## 2014-09-02 DIAGNOSIS — L89224 Pressure ulcer of left hip, stage 4: Secondary | ICD-10-CM | POA: Diagnosis present

## 2014-09-02 DIAGNOSIS — E876 Hypokalemia: Secondary | ICD-10-CM | POA: Diagnosis present

## 2014-09-02 DIAGNOSIS — F17210 Nicotine dependence, cigarettes, uncomplicated: Secondary | ICD-10-CM | POA: Diagnosis present

## 2014-09-02 DIAGNOSIS — N390 Urinary tract infection, site not specified: Secondary | ICD-10-CM | POA: Diagnosis present

## 2014-09-02 DIAGNOSIS — IMO0001 Reserved for inherently not codable concepts without codable children: Secondary | ICD-10-CM | POA: Insufficient documentation

## 2014-09-02 DIAGNOSIS — R319 Hematuria, unspecified: Secondary | ICD-10-CM | POA: Diagnosis present

## 2014-09-02 DIAGNOSIS — B964 Proteus (mirabilis) (morganii) as the cause of diseases classified elsewhere: Secondary | ICD-10-CM | POA: Diagnosis present

## 2014-09-02 DIAGNOSIS — Z79899 Other long term (current) drug therapy: Secondary | ICD-10-CM

## 2014-09-02 DIAGNOSIS — D509 Iron deficiency anemia, unspecified: Secondary | ICD-10-CM | POA: Diagnosis present

## 2014-09-02 DIAGNOSIS — D72829 Elevated white blood cell count, unspecified: Secondary | ICD-10-CM | POA: Diagnosis not present

## 2014-09-02 DIAGNOSIS — F32A Depression, unspecified: Secondary | ICD-10-CM | POA: Insufficient documentation

## 2014-09-02 DIAGNOSIS — Z881 Allergy status to other antibiotic agents status: Secondary | ICD-10-CM

## 2014-09-02 DIAGNOSIS — Z7982 Long term (current) use of aspirin: Secondary | ICD-10-CM

## 2014-09-02 DIAGNOSIS — Z681 Body mass index (BMI) 19 or less, adult: Secondary | ICD-10-CM

## 2014-09-02 DIAGNOSIS — J189 Pneumonia, unspecified organism: Secondary | ICD-10-CM | POA: Diagnosis present

## 2014-09-02 DIAGNOSIS — G8250 Quadriplegia, unspecified: Secondary | ICD-10-CM | POA: Diagnosis present

## 2014-09-02 DIAGNOSIS — T40601A Poisoning by unspecified narcotics, accidental (unintentional), initial encounter: Secondary | ICD-10-CM

## 2014-09-02 DIAGNOSIS — K219 Gastro-esophageal reflux disease without esophagitis: Secondary | ICD-10-CM | POA: Diagnosis present

## 2014-09-02 DIAGNOSIS — R4182 Altered mental status, unspecified: Secondary | ICD-10-CM

## 2014-09-02 DIAGNOSIS — Z950 Presence of cardiac pacemaker: Secondary | ICD-10-CM | POA: Diagnosis not present

## 2014-09-02 DIAGNOSIS — R509 Fever, unspecified: Secondary | ICD-10-CM

## 2014-09-02 HISTORY — DX: Poisoning by unspecified narcotics, accidental (unintentional), initial encounter: T40.601A

## 2014-09-02 HISTORY — DX: Quadriplegia, unspecified: G82.50

## 2014-09-02 HISTORY — DX: Presence of cardiac pacemaker: Z95.0

## 2014-09-02 LAB — COMPREHENSIVE METABOLIC PANEL
ALK PHOS: 109 U/L (ref 38–126)
ALT: 8 U/L — ABNORMAL LOW (ref 14–54)
AST: 12 U/L — ABNORMAL LOW (ref 15–41)
Albumin: 1.9 g/dL — ABNORMAL LOW (ref 3.5–5.0)
Anion gap: 8 (ref 5–15)
BUN: 16 mg/dL (ref 6–20)
CO2: 25 mmol/L (ref 22–32)
CREATININE: 0.55 mg/dL (ref 0.44–1.00)
Calcium: 8.2 mg/dL — ABNORMAL LOW (ref 8.9–10.3)
Chloride: 106 mmol/L (ref 101–111)
Glucose, Bld: 112 mg/dL — ABNORMAL HIGH (ref 65–99)
Potassium: 3.5 mmol/L (ref 3.5–5.1)
Sodium: 139 mmol/L (ref 135–145)
Total Bilirubin: 0.2 mg/dL — ABNORMAL LOW (ref 0.3–1.2)
Total Protein: 5.7 g/dL — ABNORMAL LOW (ref 6.5–8.1)

## 2014-09-02 LAB — I-STAT CHEM 8, ED
BUN: 18 mg/dL (ref 6–20)
CALCIUM ION: 1.15 mmol/L (ref 1.12–1.23)
Chloride: 104 mmol/L (ref 101–111)
Creatinine, Ser: 0.8 mg/dL (ref 0.44–1.00)
Glucose, Bld: 110 mg/dL — ABNORMAL HIGH (ref 65–99)
HCT: 31 % — ABNORMAL LOW (ref 36.0–46.0)
Hemoglobin: 10.5 g/dL — ABNORMAL LOW (ref 12.0–15.0)
Potassium: 3.6 mmol/L (ref 3.5–5.1)
Sodium: 138 mmol/L (ref 135–145)
TCO2: 25 mmol/L (ref 0–100)

## 2014-09-02 LAB — URINALYSIS, ROUTINE W REFLEX MICROSCOPIC
BILIRUBIN URINE: NEGATIVE
Glucose, UA: NEGATIVE mg/dL
Ketones, ur: NEGATIVE mg/dL
Nitrite: NEGATIVE
Protein, ur: 100 mg/dL — AB
Specific Gravity, Urine: 1.013 (ref 1.005–1.030)
UROBILINOGEN UA: 0.2 mg/dL (ref 0.0–1.0)
pH: 7.5 (ref 5.0–8.0)

## 2014-09-02 LAB — I-STAT TROPONIN, ED: TROPONIN I, POC: 0.01 ng/mL (ref 0.00–0.08)

## 2014-09-02 LAB — RAPID URINE DRUG SCREEN, HOSP PERFORMED
Amphetamines: NOT DETECTED
Barbiturates: NOT DETECTED
Benzodiazepines: NOT DETECTED
Cocaine: NOT DETECTED
Opiates: POSITIVE — AB
TETRAHYDROCANNABINOL: NOT DETECTED

## 2014-09-02 LAB — CBC WITH DIFFERENTIAL/PLATELET
BASOS ABS: 0 10*3/uL (ref 0.0–0.1)
BASOS PCT: 0 % (ref 0–1)
Eosinophils Absolute: 0.2 10*3/uL (ref 0.0–0.7)
Eosinophils Relative: 2 % (ref 0–5)
HEMATOCRIT: 32.9 % — AB (ref 36.0–46.0)
HEMOGLOBIN: 10.3 g/dL — AB (ref 12.0–15.0)
Lymphocytes Relative: 11 % — ABNORMAL LOW (ref 12–46)
Lymphs Abs: 1.4 10*3/uL (ref 0.7–4.0)
MCH: 27.7 pg (ref 26.0–34.0)
MCHC: 31.3 g/dL (ref 30.0–36.0)
MCV: 88.4 fL (ref 78.0–100.0)
Monocytes Absolute: 1.1 10*3/uL — ABNORMAL HIGH (ref 0.1–1.0)
Monocytes Relative: 9 % (ref 3–12)
NEUTROS PCT: 78 % — AB (ref 43–77)
Neutro Abs: 10 10*3/uL — ABNORMAL HIGH (ref 1.7–7.7)
Platelets: 439 10*3/uL — ABNORMAL HIGH (ref 150–400)
RBC: 3.72 MIL/uL — ABNORMAL LOW (ref 3.87–5.11)
RDW: 17.2 % — ABNORMAL HIGH (ref 11.5–15.5)
WBC: 12.7 10*3/uL — ABNORMAL HIGH (ref 4.0–10.5)

## 2014-09-02 LAB — ETHANOL: Alcohol, Ethyl (B): <5 mg/dL (ref <5)

## 2014-09-02 LAB — CBG MONITORING, ED: Glucose-Capillary: 100 mg/dL — ABNORMAL HIGH (ref 65–99)

## 2014-09-02 LAB — LIPASE, BLOOD: LIPASE: 56 U/L — AB (ref 22–51)

## 2014-09-02 LAB — MRSA PCR SCREENING: MRSA by PCR: NEGATIVE

## 2014-09-02 LAB — POC URINE PREG, ED: PREG TEST UR: NEGATIVE

## 2014-09-02 LAB — I-STAT CG4 LACTIC ACID, ED: Lactic Acid, Venous: 0.8 mmol/L (ref 0.5–2.0)

## 2014-09-02 LAB — URINE MICROSCOPIC-ADD ON

## 2014-09-02 LAB — PROTIME-INR
INR: 1.04 (ref 0.00–1.49)
Prothrombin Time: 13.8 seconds (ref 11.6–15.2)

## 2014-09-02 MED ORDER — CITALOPRAM HYDROBROMIDE 20 MG PO TABS
20.0000 mg | ORAL_TABLET | Freq: Every day | ORAL | Status: DC
  Administered 2014-09-02 – 2014-09-06 (×5): 20 mg via ORAL
  Filled 2014-09-02 (×5): qty 1

## 2014-09-02 MED ORDER — NALOXONE HCL 0.4 MG/ML IJ SOLN
INTRAMUSCULAR | Status: AC
  Administered 2014-09-02: 0.4 mg
  Filled 2014-09-02: qty 1

## 2014-09-02 MED ORDER — DEXTROSE 5 % IV SOLN
1.0000 g | INTRAVENOUS | Status: DC
  Administered 2014-09-03: 1 g via INTRAVENOUS
  Filled 2014-09-02: qty 10

## 2014-09-02 MED ORDER — NALOXONE HCL 1 MG/ML IJ SOLN
0.5000 mg/h | INTRAMUSCULAR | Status: DC
  Administered 2014-09-02 (×2): 0.5 mg/h via INTRAVENOUS
  Filled 2014-09-02 (×2): qty 4

## 2014-09-02 MED ORDER — OXYCODONE HCL 5 MG PO TABS
15.0000 mg | ORAL_TABLET | ORAL | Status: DC | PRN
  Administered 2014-09-02 – 2014-09-06 (×19): 15 mg via ORAL
  Filled 2014-09-02 (×19): qty 3

## 2014-09-02 MED ORDER — ETOMIDATE 2 MG/ML IV SOLN
INTRAVENOUS | Status: AC
  Filled 2014-09-02: qty 20

## 2014-09-02 MED ORDER — NALOXONE HCL 1 MG/ML IJ SOLN
1.0000 mg | Freq: Once | INTRAMUSCULAR | Status: AC
  Administered 2014-09-02: 1 mg via INTRAVENOUS
  Filled 2014-09-02: qty 2

## 2014-09-02 MED ORDER — NALOXONE HCL 1 MG/ML IJ SOLN
1.0000 mg | Freq: Once | INTRAMUSCULAR | Status: AC
  Administered 2014-09-02: 1 mg via INTRAVENOUS

## 2014-09-02 MED ORDER — ACETAMINOPHEN 325 MG PO TABS
650.0000 mg | ORAL_TABLET | Freq: Four times a day (QID) | ORAL | Status: DC | PRN
  Administered 2014-09-02 – 2014-09-04 (×3): 650 mg via ORAL
  Filled 2014-09-02 (×2): qty 2

## 2014-09-02 MED ORDER — SUCCINYLCHOLINE CHLORIDE 20 MG/ML IJ SOLN
INTRAMUSCULAR | Status: AC
  Filled 2014-09-02: qty 1

## 2014-09-02 MED ORDER — NALOXONE HCL 1 MG/ML IJ SOLN
INTRAMUSCULAR | Status: AC
  Filled 2014-09-02: qty 2

## 2014-09-02 MED ORDER — FERROUS SULFATE 325 (65 FE) MG PO TABS
325.0000 mg | ORAL_TABLET | Freq: Three times a day (TID) | ORAL | Status: DC
  Administered 2014-09-02 – 2014-09-06 (×13): 325 mg via ORAL
  Filled 2014-09-02 (×13): qty 1

## 2014-09-02 MED ORDER — PANTOPRAZOLE SODIUM 40 MG PO TBEC
40.0000 mg | DELAYED_RELEASE_TABLET | Freq: Every day | ORAL | Status: DC
  Administered 2014-09-02 – 2014-09-06 (×5): 40 mg via ORAL
  Filled 2014-09-02 (×5): qty 1

## 2014-09-02 MED ORDER — ROCURONIUM BROMIDE 50 MG/5ML IV SOLN
INTRAVENOUS | Status: AC
  Filled 2014-09-02: qty 2

## 2014-09-02 MED ORDER — CIPROFLOXACIN IN D5W 400 MG/200ML IV SOLN: 400.0000 mg | INTRAVENOUS | Status: AC

## 2014-09-02 MED ORDER — CIPROFLOXACIN IN D5W 400 MG/200ML IV SOLN
400.0000 mg | Freq: Two times a day (BID) | INTRAVENOUS | Status: DC
  Administered 2014-09-02: 400 mg via INTRAVENOUS
  Filled 2014-09-02: qty 200

## 2014-09-02 MED ORDER — NOREPINEPHRINE BITARTRATE 1 MG/ML IV SOLN
0.0000 ug/min | Freq: Once | INTRAVENOUS | Status: DC
  Filled 2014-09-02: qty 4

## 2014-09-02 MED ORDER — BACLOFEN 10 MG PO TABS
20.0000 mg | ORAL_TABLET | Freq: Three times a day (TID) | ORAL | Status: DC
  Administered 2014-09-02 – 2014-09-06 (×13): 20 mg via ORAL
  Filled 2014-09-02: qty 1
  Filled 2014-09-02 (×3): qty 2
  Filled 2014-09-02: qty 1
  Filled 2014-09-02 (×10): qty 2
  Filled 2014-09-02: qty 1
  Filled 2014-09-02: qty 2

## 2014-09-02 MED ORDER — ALBUTEROL SULFATE (2.5 MG/3ML) 0.083% IN NEBU: 2.5000 mg | INHALATION_SOLUTION | Freq: Four times a day (QID) | RESPIRATORY_TRACT | Status: DC | PRN

## 2014-09-02 MED ORDER — LIDOCAINE HCL (CARDIAC) 20 MG/ML IV SOLN
INTRAVENOUS | Status: AC
  Filled 2014-09-02: qty 5

## 2014-09-02 MED ORDER — DULOXETINE HCL 20 MG PO CPEP
20.0000 mg | ORAL_CAPSULE | Freq: Two times a day (BID) | ORAL | Status: DC
  Administered 2014-09-02 – 2014-09-06 (×9): 20 mg via ORAL
  Filled 2014-09-02 (×10): qty 1

## 2014-09-02 MED ORDER — GABAPENTIN 300 MG PO CAPS
300.0000 mg | ORAL_CAPSULE | Freq: Three times a day (TID) | ORAL | Status: DC
  Administered 2014-09-02 – 2014-09-06 (×13): 300 mg via ORAL
  Filled 2014-09-02 (×13): qty 1

## 2014-09-02 MED ORDER — HEPARIN SODIUM (PORCINE) 5000 UNIT/ML IJ SOLN
5000.0000 [IU] | Freq: Three times a day (TID) | INTRAMUSCULAR | Status: DC
  Administered 2014-09-02 – 2014-09-06 (×13): 5000 [IU] via SUBCUTANEOUS
  Filled 2014-09-02 (×17): qty 1

## 2014-09-02 MED ORDER — ALBUTEROL SULFATE HFA 108 (90 BASE) MCG/ACT IN AERS: 2.0000 | INHALATION_SPRAY | Freq: Four times a day (QID) | RESPIRATORY_TRACT | Status: DC | PRN

## 2014-09-02 MED ORDER — SODIUM CHLORIDE 0.9 % IV SOLN
INTRAVENOUS | Status: DC
  Administered 2014-09-02 – 2014-09-05 (×6): via INTRAVENOUS

## 2014-09-02 NOTE — H&P (Signed)
[facility] History and Physical  Angela Page XLK:[id number] DOB: [birth year] DOA: 09/02/2014  Referring physician: EDP PCP: No PCP Per Patient   Chief Complaint: OD   HPI: Angela Page is a 44 y.o. female with h/o quadriparesis, pacemaker placement, sacral decubitus.  Patient is brought in to the ED hypotensive and unresponsive after she "snorted a Roxicodone".  She required total of 3mg  of narcan in the ED to get her responsive.  She is now awake and responding appropriately to questions.  Last had 1mg  narcan 10 mins ago.  Review of Systems: Systems reviewed.  As above, otherwise negative  Past Medical History  Diagnosis Date  . Asthma   . Pleurisy   . GERD (gastroesophageal reflux disease)   . Quadriparesis   . Pacemaker    Past Surgical History  Procedure Laterality Date  . Appendectomy    . Cardiac surgery    . I&d extremity  10/28/2011    Procedure: IRRIGATION AND DEBRIDEMENT EXTREMITY;  Surgeon: Tennis Must, MD;  Location: [place];  Service: Orthopedics;  Laterality: Right;  Irrigation and debridement right middle finger   Social History:  reports that she has been smoking Cigarettes.  She has a 25 pack-year smoking history. She does not have any smokeless tobacco history on file. She reports that she drinks alcohol. She reports that she does not use illicit drugs.  Allergies  Allergen Reactions  . Erythromycin Nausea And Vomiting  . Nitrofurantoin Monohyd Macro Nausea And Vomiting  . Penicillins Hives    Family History  Problem Relation Age of Onset  . Hypertension Maternal Uncle      Prior to Admission medications   Medication Sig Start Date End Date Taking? Authorizing Provider  albuterol (PROVENTIL HFA;VENTOLIN HFA) 108 (90 BASE) MCG/ACT inhaler Inhale 2 puffs into the lungs every 6 (six) hours as needed. For shortness of breath     Historical Provider, MD  aspirin 325 MG EC tablet Take 650 mg by mouth every 4 (four) hours as  needed. For pain    Historical Provider, MD  baclofen (LIORESAL) 20 MG tablet Take 20 mg by mouth 3 (three) times daily. 07/04/14   Historical Provider, MD  citalopram (CELEXA) 20 MG tablet Take 20 mg by mouth daily. 07/04/14   Historical Provider, MD  doxycycline (VIBRA-TABS) 100 MG tablet Take 100 mg by mouth every 12 (twelve) hours. 07/04/14   Historical Provider, MD  DULoxetine (CYMBALTA) 20 MG capsule Take 20 mg by mouth 2 (two) times daily. 07/04/14   Historical Provider, MD  ferrous sulfate 325 (65 FE) MG tablet Take 1 tablet (325 mg total) by mouth 3 (three) times daily. 08/04/14   Janece Canterbury, MD  fluconazole (DIFLUCAN) 200 MG tablet Take 1 tablet (200 mg total) by mouth daily. 08/04/14   Janece Canterbury, MD  gabapentin (NEURONTIN) 300 MG capsule Take 300 mg by mouth 3 (three) times daily. 07/04/14   Historical Provider, MD  nystatin (MYCOSTATIN/NYSTOP) 100000 UNIT/GM POWD Apply 1 g topically 2 (two) times daily as needed (for vaginal yeast/redness).    Historical Provider, MD  omeprazole (PRILOSEC) 20 MG capsule Take 1 capsule (20 mg total) by mouth daily. 08/04/14   Janece Canterbury, MD  oxyCODONE (ROXICODONE) 15 MG immediate release tablet Take 15 mg by mouth every 4 (four) hours as needed. for pain 06/13/14   Historical Provider, MD  sodium hypochlorite (DAKIN'S 1/2 STRENGTH) external solution Apply 1 application topically 2 (two) times daily. For wounds, if wounds are deeper  they are irrigated, when changing dressing.    Historical Provider, MD  zinc oxide (BALMEX) 11.3 % CREA cream Apply 1 application topically 3 (three) times daily as needed (for skin redness/irritation).    Historical Provider, MD   Physical Exam: Filed Vitals:   09/02/14 0250  BP: 115/73  Pulse: 80  Temp:   Resp: 11    BP 115/73 mmHg  Pulse 80  Temp(Src) 96.4 F (35.8 C) (Rectal)  Resp 11  SpO2 100%  LMP   General Appearance:    Alert, oriented, no distress, appears stated age  Head:    Normocephalic,  atraumatic  Eyes:    PERRL, EOMI, sclera non-icteric        Nose:   Nares without drainage or epistaxis. Mucosa, turbinates normal  Throat:   Moist mucous membranes. Oropharynx without erythema or exudate.  Neck:   Supple. No carotid bruits.  No thyromegaly.  No lymphadenopathy.   Back:     No CVA tenderness, no spinal tenderness  Lungs:     Clear to auscultation bilaterally, without wheezes, rhonchi or rales  Chest wall:    No tenderness to palpitation  Heart:    Regular rate and rhythm without murmurs, gallops, rubs  Abdomen:     Soft, non-tender, nondistended, normal bowel sounds, no organomegaly  Genitalia:    deferred  Rectal:    deferred  Extremities:   No clubbing, cyanosis or edema.  Pulses:   2+ and symmetric all extremities  Skin:   Skin color, texture, turgor normal, no rashes or lesions  Lymph nodes:   Cervical, supraclavicular, and axillary nodes normal  Neurologic:   CNII-XII intact. Normal strength, sensation and reflexes      throughout    Labs on Admission:  Basic Metabolic Panel:  Recent Labs Lab 09/02/14 0143 09/02/14 0158  NA 139 138  K 3.5 3.6  CL 106 104  CO2 25  --   GLUCOSE 112* 110*  BUN 16 18  CREATININE 0.55 0.80  CALCIUM 8.2*  --    Liver Function Tests:  Recent Labs Lab 09/02/14 0143  AST 12*  ALT 8*  ALKPHOS 109  BILITOT 0.2*  PROT 5.7*  ALBUMIN 1.9*    Recent Labs Lab 09/02/14 0143  LIPASE 56*   No results for input(s): AMMONIA in the last 168 hours. CBC:  Recent Labs Lab 09/02/14 0143 09/02/14 0158  WBC 12.7*  --   NEUTROABS 10.0*  --   HGB 10.3* 10.5*  HCT 32.9* 31.0*  MCV 88.4  --   PLT 439*  --    Cardiac Enzymes: No results for input(s): CKTOTAL, CKMB, CKMBINDEX, TROPONINI in the last 168 hours.  BNP (last 3 results) No results for input(s): PROBNP in the last 8760 hours. CBG:  Recent Labs Lab 09/02/14 0047  GLUCAP 100*    Radiological Exams on Admission: Dg Chest Port 1 View  09/02/2014   CLINICAL  DATA:  Unresponsive.  Altered mental status.  EXAM: PORTABLE CHEST - 1 VIEW  COMPARISON:  One-view chest x-ray 08/01/2014.  FINDINGS: Heart size is normal. Mild interstitial edema is present. There is no effusion. Pacing wires are stable in position. The visualized soft tissues and bony thorax are unremarkable.  IMPRESSION: 1. Mild interstitial edema without focal airspace disease.   Electronically Signed   By: San Morelle M.D.   On: 09/02/2014 01:56    EKG: Independently reviewed.  Assessment/Plan Principal Problem:   Overdose of opiate or related narcotic Active Problems:  UTI (lower urinary tract infection)   1. OD of narcotic - 1. Narcan gtt started 2. Admit to ICU for monitoring and titration 3. Continuous pulse ox 2. UTI - 1. Rocephin ordered 2. Cultures pending 3. Sacral decubitus - wound care consult ordered 4. Chronic hypotension - note that patients baseline BP is in the 08M systolic usually per prior notes    Code Status: Full  Family Communication: No family in room Disposition Plan: Admit to inpatient   Time spent: 70 min  Angela Page M. [facility] Pager [phone]  If 7AM-7PM, please contact the day team taking care of the patient [email or web address] Password [facility] 09/02/2014, 3:31 AM

## 2014-09-02 NOTE — Progress Notes (Signed)
   [facility]                                                                              Patient Demographics  Angela Page, is a 44 y.o. female, DOB - 07/23/1970, OPF:[id number]  Admit date - 09/02/2014   Admitting Physician Angela Quill, DO  Outpatient Primary MD for the patient is No PCP Per Patient  LOS - 0   Chief Complaint  Patient presents with  . Drug Overdose      HPI on 09/02/2014 by Dr. Jennette Page Angela Page is a 44 y.o. female with h/o quadriparesis, pacemaker placement, sacral decubitus. Patient is brought in to the ED hypotensive and unresponsive after she "snorted a Roxicodone". She required total of 3mg  of narcan in the ED to get her responsive. She is now awake and responding appropriately to questions. Last had 1mg  narcan 10 mins ago.  Assessment & Plan   Patient admitted earlier this morning by Dr. Jennette Page.  Please see full H&P for details.  Agree with current assessment and plan.  Drug overdose -Patient currently awake and alert. Admitted to snorting Roxicodone yesterday evening. States that she only used one extra pill. -Was placed on Narcan drip, will discontinue -Will continue to monitor for an additional 24 hours. -Vital signs currently stable. Blood pressure does run on the softer side. -Tox screen positive for opiates  Normocytic Anemia -Hemoglobin since July has been approximately 9-10 -Hemoglobin Currently 10.5, continue to monitor CBC -Will monitor closely due to hematuria  Leukocytosis -Likely reactive, continue to monitor CBC -Chest x-ray shows mild interstitial edema -Urine culture pending, UA showed large leukocytes however no bacteria noted  Quadriplegia -Stable, able to move upper extremities however not able to use hands. -Continue baclofen  Hematuria -UA showed large urine dipstick, red urine, large leukocytes -Likely secondary to trauma as patient pulled her Foley out  Depression  Code Status:  Full  Family Communication: None at bedside  Disposition Plan: Admitted  Time Spent in minutes   30 minutes  Procedures  None  Consults   None  DVT Prophylaxis  Heparin  Angela Page D.O. on 09/02/2014 at 2:10 PM  Between 7am to 7pm - Pager - [phone]  After 7pm go to [email or web address] - password [email or web address]  And look for the night coverage person covering for me after hours  [facility] Office  [phone]

## 2014-09-02 NOTE — ED Notes (Signed)
Pt brought in by EMS unresponsive.  EMS was called out d/t pt acting, "strange."  Pt had pulled her foley out d/t pain.  In room pt had IO placed by Dr. Claudine Mouton.  2 mg narcan given.  NS fluid bolus infusing at this time.  After second narcan pt fully awake.  Pt is now awake and reports her husband gave her a pill and she snorted it.  Pt is A&O x 4.

## 2014-09-02 NOTE — ED Provider Notes (Signed)
CSN: [id number]   Arrival date & time 09/02/14 0039  History  This chart was scribed for  Everlene Balls, MD by Altamease Oiler, ED Scribe. This patient was seen in room RESB/RESB and the patient's care was started at 12:37 AM.  Chief Complaint  Patient presents with  . Drug Overdose    HPI The history is provided by the patient and the EMS personnel. No language interpreter was used.    Brought in by EMS, Angela Page is a 44 y.o. female who presents to the Emergency Department unresponsive. Per EMS the pt was alert and talking when they arrived on scene after being called by the patient's mother for strange behavior. En route she stated that it felt like she was dying and became increasingly unresponsive. After 2 mg of narcan in the ED pt became responsive and states that she "snorted" a pill that her husband gave her. She thought that it was a 30 mg Roxicet but is now doubtful because she has never had an episode like this after using Roxicet. Her mother recently removed her catheter because she was concerned for UTI as the pt had a fever and some pain at the catheter site.   Past Medical History  Diagnosis Date  . Asthma   . Pleurisy   . GERD (gastroesophageal reflux disease)     Past Surgical History  Procedure Laterality Date  . Appendectomy    . Cardiac surgery    . I&d extremity  10/28/2011    Procedure: IRRIGATION AND DEBRIDEMENT EXTREMITY;  Surgeon: Tennis Must, MD;  Location: [facility];  Service: Orthopedics;  Laterality: Right;  Irrigation and debridement right middle finger    Family History  Problem Relation Age of Onset  . Hypertension Maternal Uncle     Social History  Substance Use Topics  . Smoking status: Current Every Day Smoker -- 1.00 packs/day for 25 years    Types: Cigarettes  . Smokeless tobacco: None  . Alcohol Use: Yes     Comment: 2 x weekly     Review of Systems  10 Systems reviewed and all are negative for acute change except as  noted in the HPI.  Home Medications   Prior to Admission medications   Medication Sig Start Date End Date Taking? Authorizing Provider  albuterol (PROVENTIL HFA;VENTOLIN HFA) 108 (90 BASE) MCG/ACT inhaler Inhale 2 puffs into the lungs every 6 (six) hours as needed. For shortness of breath     Historical Provider, MD  aspirin 325 MG EC tablet Take 650 mg by mouth every 4 (four) hours as needed. For pain    Historical Provider, MD  baclofen (LIORESAL) 20 MG tablet Take 20 mg by mouth 3 (three) times daily. 07/04/14   Historical Provider, MD  citalopram (CELEXA) 20 MG tablet Take 20 mg by mouth daily. 07/04/14   Historical Provider, MD  doxycycline (VIBRA-TABS) 100 MG tablet Take 100 mg by mouth every 12 (twelve) hours. 07/04/14   Historical Provider, MD  DULoxetine (CYMBALTA) 20 MG capsule Take 20 mg by mouth 2 (two) times daily. 07/04/14   Historical Provider, MD  ferrous sulfate 325 (65 FE) MG tablet Take 1 tablet (325 mg total) by mouth 3 (three) times daily. 08/04/14   Janece Canterbury, MD  fluconazole (DIFLUCAN) 200 MG tablet Take 1 tablet (200 mg total) by mouth daily. 08/04/14   Janece Canterbury, MD  gabapentin (NEURONTIN) 300 MG capsule Take 300 mg by mouth 3 (three) times daily. 07/04/14  Historical Provider, MD  nystatin (MYCOSTATIN/NYSTOP) 100000 UNIT/GM POWD Apply 1 g topically 2 (two) times daily as needed (for vaginal yeast/redness).    Historical Provider, MD  omeprazole (PRILOSEC) 20 MG capsule Take 1 capsule (20 mg total) by mouth daily. 08/04/14   Janece Canterbury, MD  oxyCODONE (ROXICODONE) 15 MG immediate release tablet Take 15 mg by mouth every 4 (four) hours as needed. for pain 06/13/14   Historical Provider, MD  sodium hypochlorite (DAKIN'S 1/2 STRENGTH) external solution Apply 1 application topically 2 (two) times daily. For wounds, if wounds are deeper they are irrigated, when changing dressing.    Historical Provider, MD  zinc oxide (BALMEX) 11.3 % CREA cream Apply 1 application  topically 3 (three) times daily as needed (for skin redness/irritation).    Historical Provider, MD    Allergies  Erythromycin; Nitrofurantoin monohyd macro; and Penicillins  Triage Vitals: BP 111/74 mmHg  Pulse 80  Temp(Src) 96.4 F (35.8 C) (Rectal)  Resp 12  SpO2 100%  LMP   Physical Exam  Constitutional: She appears well-developed and well-nourished.  Non-responsive to any physical stimuli Appearing pale  HENT:  Head: Normocephalic and atraumatic.  Nose: Nose normal.  Mouth/Throat: Oropharynx is clear and moist. No oropharyngeal exudate.  Eyes: Conjunctivae and EOM are normal. Pupils are equal, round, and reactive to light. No scleral icterus.  Neck: Normal range of motion. Neck supple. No JVD present. No tracheal deviation present. No thyromegaly present.  Cardiovascular: Regular rhythm.  Bradycardia present.  Exam reveals no gallop and no friction rub.   No murmur heard. No palpable peripheral pulses, palpable carotid pulse  Pulmonary/Chest: Bradypnea noted. She is in respiratory distress. She has no wheezes. She exhibits no tenderness.  Shallow respirations  Abdominal: Soft. Bowel sounds are normal. She exhibits no distension and no mass. There is no tenderness. There is no rebound and no guarding.  Musculoskeletal: Normal range of motion. She exhibits no edema or tenderness.  Lymphadenopathy:    She has no cervical adenopathy.  Neurological: She exhibits normal muscle tone.  Skin: Skin is warm and dry. No rash noted. No erythema. There is pallor.     Nursing note and vitals reviewed.   ED Course  Procedures   DIAGNOSTIC STUDIES: Oxygen Saturation is 100% on RA, normal by my interpretation.    COORDINATION OF CARE: 12:48 AM Treatment plan includes lab work, CXR, EKG, IVF.    Labs Review-  Labs Reviewed  CBC WITH DIFFERENTIAL/PLATELET - Abnormal; Notable for the following:    WBC 12.7 (*)    RBC 3.72 (*)    Hemoglobin 10.3 (*)    HCT 32.9 (*)    RDW  17.2 (*)    Platelets 439 (*)    Neutrophils Relative % 78 (*)    Neutro Abs 10.0 (*)    Lymphocytes Relative 11 (*)    Monocytes Absolute 1.1 (*)    All other components within normal limits  COMPREHENSIVE METABOLIC PANEL - Abnormal; Notable for the following:    Glucose, Bld 112 (*)    Calcium 8.2 (*)    Total Protein 5.7 (*)    Albumin 1.9 (*)    AST 12 (*)    ALT 8 (*)    Total Bilirubin 0.2 (*)    All other components within normal limits  LIPASE, BLOOD - Abnormal; Notable for the following:    Lipase 56 (*)    All other components within normal limits  URINALYSIS, ROUTINE W REFLEX MICROSCOPIC (NOT AT  [facility]) - Abnormal; Notable for the following:    Color, Urine RED (*)    APPearance TURBID (*)    Hgb urine dipstick LARGE (*)    Protein, ur 100 (*)    Leukocytes, UA LARGE (*)    All other components within normal limits  URINE RAPID DRUG SCREEN, HOSP PERFORMED - Abnormal; Notable for the following:    Opiates POSITIVE (*)    All other components within normal limits  CBG MONITORING, ED - Abnormal; Notable for the following:    Glucose-Capillary 100 (*)    All other components within normal limits  I-STAT CHEM 8, ED - Abnormal; Notable for the following:    Glucose, Bld 110 (*)    Hemoglobin 10.5 (*)    HCT 31.0 (*)    All other components within normal limits  URINE CULTURE  PROTIME-INR  ETHANOL  URINE MICROSCOPIC-ADD ON  I-STAT CG4 LACTIC ACID, ED  Randolm Idol, ED  POC URINE PREG, ED    Imaging Review Dg Chest Port 1 View  09/02/2014   CLINICAL DATA:  Unresponsive.  Altered mental status.  EXAM: PORTABLE CHEST - 1 VIEW  COMPARISON:  One-view chest x-ray 08/01/2014.  FINDINGS: Heart size is normal. Mild interstitial edema is present. There is no effusion. Pacing wires are stable in position. The visualized soft tissues and bony thorax are unremarkable.  IMPRESSION: 1. Mild interstitial edema without focal airspace disease.   Electronically Signed   By:  San Morelle M.D.   On: 09/02/2014 01:56    EKG Interpretation  Date/Time:  Saturday September 02 2014 00:45:20 EDT Ventricular Rate:  80 PR Interval:  162 QRS Duration: 78 QT Interval:  435 QTC Calculation: 502 R Axis:   61 Text Interpretation:  Atrial-paced complexes Nonspecific T abnrm, anterolateral leads Prolonged QT interval No significant change since last tracing Confirmed by Glynn Octave [phone]) on 09/02/2014 2:04:21 AM       MDM   Final diagnoses:  Altered mental status   Patient presents to emergency department for altered mental status. Upon my initial assessment, a code sepsis was called. Patient with blood pressure of 50 over palpable. She is not responsive to any physical stimuli. She had no peripheral pulses, I can only palpate carotid pulses. Respirations were decreased as well with normal pupils. She was immediately moved to resuscitation room, intraosseous access was obtained and patient was given IV fluid bolus.  Blood pressure rose after 2 L IV fluid bolus was given. She states her normal blood pressure is 80/60, current blood pressure is 110/83. While setting up, patient was given 2 mg of IV Narcan with good response. She then woke up and asked what she was doing there.  Patient states she had a recent UTI and completed a course of antibiotics.    Throughout her emergency department stay, she continued to have episodes of hypotension and minimal responsiveness. She required further dosing of Narcan. At this point patient will require admission for observation for overdose. She states she snorted a 30 mg oxycodone immediately prior to this given to her by her husband.   CRITICAL CARE Performed by: Everlene Balls   Total critical care time: 17min - AMS, drug overdose  Critical care time was exclusive of separately billable procedures and treating other patients.  Critical care was necessary to treat or prevent imminent or life-threatening  deterioration.  Critical care was time spent personally by me on the following activities: development of treatment plan with patient and/or surrogate  as well as nursing, discussions with consultants, evaluation of patient's response to treatment, examination of patient, obtaining history from patient or surrogate, ordering and performing treatments and interventions, ordering and review of laboratory studies, ordering and review of radiographic studies, pulse oximetry and re-evaluation of patient's condition.   I personally performed the services described in this documentation, which was scribed in my presence. The recorded information has been reviewed and is accurate.   Everlene Balls, MD 09/02/14 [phone]

## 2014-09-02 NOTE — Progress Notes (Signed)
Spoke with patient about medication abuse.  Pt stated she planned to talk to her PCP when she got discharged to see if she could see a pain specialist.  She said she consumed the medication via snorting it due to being in extreme pain.  RN educated her on the risks of abusing medications.  Pt verbalized understanding.  Pt stated she did not want to see a Education officer, museum at this time.  RN spoke with the MD Dr. Ree Kida about this conversation and Dr. Ree Kida said she spoke with the patient about the same thing.  Pt states she will follow through with the recommendations.  No further concerns at this time.  Will continue to monitor.  Iantha Fallen RN 8:32 PM 09/02/2014

## 2014-09-02 NOTE — Progress Notes (Signed)
ANTIBIOTIC CONSULT NOTE - INITIAL  Pharmacy Consult for Cipro Indication: UTI  Allergies  Allergen Reactions  . Erythromycin Nausea And Vomiting  . Nitrofurantoin Monohyd Macro Nausea And Vomiting  . Penicillins Hives    Patient Measurements: Weight: 110 lb 0.2 oz (49.9 kg)  Vital Signs: Temp: 96.4 F (35.8 C) (08/13 0208) Temp Source: Rectal (08/13 0208) BP: 115/73 mmHg (08/13 0250) Pulse Rate: 80 (08/13 0250) Intake/Output from previous day:   Intake/Output from this shift:    Labs:  Recent Labs  09/02/14 0143 09/02/14 0158  WBC 12.7*  --   HGB 10.3* 10.5*  PLT 439*  --   CREATININE 0.55 0.80   Estimated Creatinine Clearance: 71.4 mL/min (by C-G formula based on Cr of 0.8). No results for input(s): VANCOTROUGH, VANCOPEAK, VANCORANDOM, GENTTROUGH, GENTPEAK, GENTRANDOM, TOBRATROUGH, TOBRAPEAK, TOBRARND, AMIKACINPEAK, AMIKACINTROU, AMIKACIN in the last 72 hours.   Microbiology: No results found for this or any previous visit (from the past 720 hour(s)).  Medical History: Past Medical History  Diagnosis Date  . Asthma   . Pleurisy   . GERD (gastroesophageal reflux disease)   . Quadriparesis   . Pacemaker     Medications:  Scheduled:  . baclofen  20 mg Oral TID  . citalopram  20 mg Oral Daily  . DULoxetine  20 mg Oral BID  . etomidate      . ferrous sulfate  325 mg Oral TID  . gabapentin  300 mg Oral TID  . heparin  5,000 Units Subcutaneous 3 times per day  . lidocaine (cardiac) 100 mg/33ml      . naloxone      . pantoprazole  40 mg Oral Daily  . rocuronium      . succinylcholine       Infusions:  . sodium chloride    . ciprofloxacin    . ciprofloxacin    . naLOXone [facility]) adult infusion for OVERDOSE     Assessment:  44 yr female brought to ED unresponsive.  Being treated for OD of reported roxicodone. PMH include quadriparesis, asthma, pleurisy, pacemaker.  Concern for UTI; urinary catheter recently removed.  Reported at home fever and pain  at site of catheter.  Pharmacy consulted to dose Cipro for suspected UTI  Urine culture ordered  Goal of Therapy:  Eradication of infection  Plan:  Follow up culture results  Cipro 400mg  IV q12h  Estephania Licciardi, Toribio Harbour, PharmD 09/02/2014,3:39 AM

## 2014-09-03 ENCOUNTER — Inpatient Hospital Stay (HOSPITAL_COMMUNITY): Payer: Medicaid Other

## 2014-09-03 DIAGNOSIS — A419 Sepsis, unspecified organism: Secondary | ICD-10-CM

## 2014-09-03 DIAGNOSIS — D72829 Elevated white blood cell count, unspecified: Secondary | ICD-10-CM

## 2014-09-03 DIAGNOSIS — J189 Pneumonia, unspecified organism: Secondary | ICD-10-CM

## 2014-09-03 LAB — COMPREHENSIVE METABOLIC PANEL WITH GFR
ALT: 8 U/L — ABNORMAL LOW (ref 14–54)
AST: 16 U/L (ref 15–41)
Albumin: 1.8 g/dL — ABNORMAL LOW (ref 3.5–5.0)
Alkaline Phosphatase: 97 U/L (ref 38–126)
Anion gap: 10 (ref 5–15)
BUN: 11 mg/dL (ref 6–20)
CO2: 17 mmol/L — ABNORMAL LOW (ref 22–32)
Calcium: 8.2 mg/dL — ABNORMAL LOW (ref 8.9–10.3)
Chloride: 107 mmol/L (ref 101–111)
Creatinine, Ser: 0.68 mg/dL (ref 0.44–1.00)
GFR calc Af Amer: >60 mL/min
GFR calc non Af Amer: >60 mL/min
Glucose, Bld: 119 mg/dL — ABNORMAL HIGH (ref 65–99)
Potassium: 3 mmol/L — ABNORMAL LOW (ref 3.5–5.1)
Sodium: 134 mmol/L — ABNORMAL LOW (ref 135–145)
Total Bilirubin: 0.3 mg/dL (ref 0.3–1.2)
Total Protein: 5.1 g/dL — ABNORMAL LOW (ref 6.5–8.1)

## 2014-09-03 LAB — CBC WITH DIFFERENTIAL/PLATELET
BASOS ABS: 0 10*3/uL (ref 0.0–0.1)
BASOS PCT: 0 % (ref 0–1)
Eosinophils Absolute: 0 10*3/uL (ref 0.0–0.7)
Eosinophils Relative: 0 % (ref 0–5)
HEMATOCRIT: 29.4 % — AB (ref 36.0–46.0)
Hemoglobin: 8.9 g/dL — ABNORMAL LOW (ref 12.0–15.0)
Lymphocytes Relative: 5 % — ABNORMAL LOW (ref 12–46)
Lymphs Abs: 0.3 10*3/uL — ABNORMAL LOW (ref 0.7–4.0)
MCH: 27 pg (ref 26.0–34.0)
MCHC: 30.3 g/dL (ref 30.0–36.0)
MCV: 89.1 fL (ref 78.0–100.0)
MONOS PCT: 3 % (ref 3–12)
Monocytes Absolute: 0.2 10*3/uL (ref 0.1–1.0)
NEUTROS ABS: 6.2 10*3/uL (ref 1.7–7.7)
NEUTROS PCT: 92 % — AB (ref 43–77)
PLATELETS: 311 10*3/uL (ref 150–400)
RBC: 3.3 MIL/uL — ABNORMAL LOW (ref 3.87–5.11)
RDW: 16.7 % — ABNORMAL HIGH (ref 11.5–15.5)
WBC: 6.7 10*3/uL (ref 4.0–10.5)

## 2014-09-03 LAB — LACTIC ACID, PLASMA
LACTIC ACID, VENOUS: 1.1 mmol/L (ref 0.5–2.0)
Lactic Acid, Venous: 2.4 mmol/L (ref 0.5–2.0)

## 2014-09-03 LAB — MAGNESIUM: Magnesium: 1.3 mg/dL — ABNORMAL LOW (ref 1.7–2.4)

## 2014-09-03 MED ORDER — POTASSIUM CHLORIDE CRYS ER 20 MEQ PO TBCR
40.0000 meq | EXTENDED_RELEASE_TABLET | Freq: Two times a day (BID) | ORAL | Status: DC
  Administered 2014-09-03 (×3): 40 meq via ORAL
  Filled 2014-09-03 (×3): qty 2

## 2014-09-03 MED ORDER — SODIUM CHLORIDE 0.9 % IV BOLUS (SEPSIS)
1000.0000 mL | Freq: Once | INTRAVENOUS | Status: AC
  Administered 2014-09-03: 1000 mL via INTRAVENOUS

## 2014-09-03 MED ORDER — SODIUM CHLORIDE 0.9 % IV SOLN
250.0000 mg | Freq: Three times a day (TID) | INTRAVENOUS | Status: DC
  Administered 2014-09-03 (×2): 250 mg via INTRAVENOUS
  Filled 2014-09-03 (×3): qty 250

## 2014-09-03 MED ORDER — CEFUROXIME AXETIL 500 MG PO TABS
500.0000 mg | ORAL_TABLET | Freq: Two times a day (BID) | ORAL | Status: DC
  Administered 2014-09-03: 500 mg via ORAL
  Filled 2014-09-03 (×4): qty 1

## 2014-09-03 MED ORDER — MAGNESIUM SULFATE IN D5W 10-5 MG/ML-% IV SOLN
1.0000 g | Freq: Once | INTRAVENOUS | Status: AC
  Administered 2014-09-03: 1 g via INTRAVENOUS
  Filled 2014-09-03: qty 100

## 2014-09-03 MED ORDER — ENSURE ENLIVE PO LIQD
237.0000 mL | Freq: Two times a day (BID) | ORAL | Status: DC
  Administered 2014-09-03 – 2014-09-04 (×2): 237 mL via ORAL

## 2014-09-03 NOTE — Progress Notes (Signed)
Pt examined and chart reviewed. Pt noted to have increasing temperature throughout the night. She was on cipro for UTI awaiting culture reports and had received one dose thus far. BP low-normal (pt's normal is in the 80s-90s per pt). Blood cultures drawn/septic workup initiated and started on rocephin for broader coverage and one liter of NS bolus. However, when CXR resulted it showed possibly increasing density. Changed to primaxin to cover for both urinary source and aspiration pneumonia. Pt awake and alert, pleasant. Lungs CTAB. Skin warm and dry. Abdomen noted to be distended which pt had noted beginning a day prior to admission. She does not have feeling in her abdomen so cannot assess pain level or tenderness. Bowel sounds normoactive. Explained to pt the medications and our course of action and she agreed. Giving additional bolus of NS currently.

## 2014-09-03 NOTE — Progress Notes (Signed)
Patient with fever and drop in BP to below normal. Paged NP on call and orders received.

## 2014-09-03 NOTE — Plan of Care (Signed)
Problem: Phase I Progression Outcomes Goal: Voiding-avoid urinary catheter unless indicated Outcome: Not Met (add Reason) Foley catheter placed due to wounds on sacrum and left buttock/hip area.

## 2014-09-03 NOTE — Progress Notes (Addendum)
[facility]                                                                              Patient Demographics  Angela Page, is a 44 y.o. female, DOB - December 14, 1970, SHF:[id number]  Admit date - 09/02/2014   Admitting Physician Etta Quill, DO  Outpatient Primary MD for the patient is No PCP Per Patient  LOS - 1   Chief Complaint  Patient presents with  . Drug Overdose      HPI on 09/02/2014 by Dr. Jennette Kettle Angela Page is a 44 y.o. female with h/o quadriparesis, pacemaker placement, sacral decubitus. Patient is brought in to the ED hypotensive and unresponsive after she "snorted a Roxicodone". She required total of 3mg  of narcan in the ED to get her responsive. She is now awake and responding appropriately to questions. Last had 1mg  narcan 10 mins ago.  Assessment & Plan  Sepsis possibly secondary to questionable pneumonia -Patient spiked fever overnight, 102.9 currently afebrile -Though her leukocytosis has resolved -Patient did have an elevated lactate at the time she had spiked a fever, 2.4 currently resolved -Repeat chest x-ray 8/14: Faintly increased density in the left lung could reflect mild infection -Patient was on ciprofloxacin this was discontinued and she was placed on Primaxin -Will transition to Levaquin as this will cover both questionable pneumonia as well as ? urinary tract infection  Drug overdose/AMS -Resolved -Patient currently awake and alert. Admitted to snorting Roxicodone yesterday evening. States that she only used one extra pill. -Was placed on Narcan drip, will discontinue -Vital signs currently stable. Blood pressure does run on the softer side. -Tox screen positive for opiates  Normocytic Anemia -Hemoglobin since July has been approximately 9-10 -Hemoglobin Currently 8.9 (drop likely due to IVF), continue to monitor CBC -Will monitor closely due to hematuria  Leukocytosis -Resolved, Likely reactive, continue to monitor  CBC -8/13 Chest x-ray shows mild interstitial edema -Urine culture pending, UA showed large leukocytes however no bacteria noted-  she also has an indwelling Foley catheter due to her quadriplegia.  Quadriplegia -Stable, able to move upper extremities however not able to use hands. -Continue baclofen -Foley catheter reinserted  Hematuria -UA showed large urine dipstick, red urine, large leukocytes -Likely secondary to ? trauma as Foley was pulled out -Foley reinserted, no further frank hematuria noted  Depression -Continue Celexa, Cymbalta  Hypomagnesemia -Replacing, continue to monitor  Code Status: Full  Family Communication: None at bedside  Disposition Plan: Admitted. Will continue to monitor for 24 hours, if patient remains afebrile, will discharge 09/02/2014.  Time Spent in minutes 30 minutes  Procedures  None  Consults  None  DVT Prophylaxis Heparin  Lab Results  Component Value Date   PLT 311 09/03/2014    Medications  Scheduled Meds: . baclofen  20 mg Oral TID  . citalopram  20 mg Oral Daily  . DULoxetine  20 mg Oral BID  . feeding supplement (ENSURE ENLIVE)  237 mL Oral BID BM  . ferrous sulfate  325 mg Oral TID PC  . gabapentin  300 mg Oral TID  . heparin  5,000 Units Subcutaneous 3 times per day  . imipenem-cilastatin  250  mg Intravenous Q8H  . pantoprazole  40 mg Oral Daily  . potassium chloride  40 mEq Oral BID   Continuous Infusions: . sodium chloride 75 mL/hr at 09/03/14 0843   PRN Meds:.acetaminophen, albuterol, oxyCODONE  Antibiotics    Anti-infectives    Start     Dose/Rate Route Frequency Ordered Stop   09/03/14 0200  imipenem-cilastatin (PRIMAXIN) 250 mg in sodium chloride 0.9 % 100 mL IVPB     250 mg 200 mL/hr over 30 Minutes Intravenous Every 8 hours 09/03/14 0153     09/02/14 2359  cefTRIAXone (ROCEPHIN) 1 g in dextrose 5 % 50 mL IVPB  Status:  Discontinued     1 g 100 mL/hr over 30 Minutes Intravenous Every 24 hours  09/02/14 2344 09/03/14 0139   09/02/14 1700  ciprofloxacin (CIPRO) IVPB 400 mg  Status:  Discontinued     400 mg 200 mL/hr over 60 Minutes Intravenous Every 12 hours 09/02/14 0338 09/02/14 2344   09/02/14 0345  ciprofloxacin (CIPRO) IVPB 400 mg     400 mg 200 mL/hr over 60 Minutes Intravenous STAT 09/02/14 0337 09/02/14 0445      Subjective:   [name] seen and examined today. Patient currently denies any pain. She denies any chest pain, shortness of breath or abdominal pain. Although she states that she can't feel very much below her chest area due to her quadriplegia.  Objective:   Filed Vitals:   09/03/14 0406 09/03/14 0534 09/03/14 0635 09/03/14 0740  BP: 91/39 79/50 87/47    Pulse: 80 81 80 80  Temp:  98.1 F (36.7 C)    TempSrc:  Oral    Resp:  20    Height:      Weight:      SpO2: 94% 100%  97%    Wt Readings from Last 3 Encounters:  09/02/14 44.3 kg (97 lb 10.6 oz)  08/02/14 49.9 kg (110 lb 0.2 oz)  09/16/13 46.72 kg (103 lb)     Intake/Output Summary (Last 24 hours) at 09/03/14 1132 Last data filed at 09/03/14 0740  Gross per 24 hour  Intake 4881.85 ml  Output    600 ml  Net 4281.85 ml    Exam  General: Well developed, well nourished, NAD, appears stated age  53: NCAT, mucous membranes moist.   Neck: Supple, no JVD, no masses  Cardiovascular: S1 S2 auscultated, soft murmur, RRR  Respiratory: Clear to auscultation bilaterally  Abdomen: Soft, nontender, nondistended, + bowel sounds  Extremities: warm dry without cyanosis clubbing or edema- contracted upper extremities  Neuro: AAOx3, nonfocal, qudraplegia  Data Review   Micro Results Recent Results (from the past 240 hour(s))  MRSA PCR Screening     Status: None   Collection Time: 09/02/14  4:39 AM  Result Value Ref Range Status   MRSA by PCR NEGATIVE NEGATIVE Final    Comment:        The GeneXpert MRSA Assay (FDA approved for NASAL specimens only), is one component of  a comprehensive MRSA colonization surveillance program. It is not intended to diagnose MRSA infection nor to guide or monitor treatment for MRSA infections.   Culture, blood (routine x 2)     Status: None (Preliminary result)   Collection Time: 09/03/14  2:30 AM  Result Value Ref Range Status   Specimen Description   Final    BLOOD RIGHT HAND Performed at [facility]  Final   Culture  PENDING  Incomplete   Report Status PENDING  Incomplete    Radiology Reports Dg Chest 2 View  09/03/2014   CLINICAL DATA:  Acute onset of fever.  Initial encounter.  EXAM: CHEST  2 VIEW  COMPARISON:  Chest radiograph performed 09/02/2014  FINDINGS: Vaguely increased density at the left lung could reflect mild infection. No pleural effusion or pneumothorax is seen.  The cardiomediastinal silhouette is normal in size. A pacemaker is noted at the left chest wall, with leads ending at the right atrium and right ventricle. No acute osseous abnormalities are identified.  IMPRESSION: Vaguely increased density at the left lung could reflect mild infection. Would correlate for associated symptoms.   Electronically Signed   By: Garald Balding M.D.   On: 09/03/2014 01:15   Dg Chest Port 1 View  09/02/2014   CLINICAL DATA:  Unresponsive.  Altered mental status.  EXAM: PORTABLE CHEST - 1 VIEW  COMPARISON:  One-view chest x-ray 08/01/2014.  FINDINGS: Heart size is normal. Mild interstitial edema is present. There is no effusion. Pacing wires are stable in position. The visualized soft tissues and bony thorax are unremarkable.  IMPRESSION: 1. Mild interstitial edema without focal airspace disease.   Electronically Signed   By: San Morelle M.D.   On: 09/02/2014 01:56    CBC  Recent Labs Lab 09/02/14 0143 09/02/14 0158 09/03/14 0010  WBC 12.7*  --  6.7  HGB 10.3* 10.5* 8.9*  HCT 32.9* 31.0* 29.4*  PLT 439*  --  311  MCV 88.4  --  89.1  MCH 27.7   --  27.0  MCHC 31.3  --  30.3  RDW 17.2*  --  16.7*  LYMPHSABS 1.4  --  0.3*  MONOABS 1.1*  --  0.2  EOSABS 0.2  --  0.0  BASOSABS 0.0  --  0.0    Chemistries   Recent Labs Lab 09/02/14 0143 09/02/14 0158 09/03/14 0010 09/03/14 0017  NA 139 138 134*  --   K 3.5 3.6 3.0*  --   CL 106 104 107  --   CO2 25  --  17*  --   GLUCOSE 112* 110* 119*  --   BUN 16 18 11   --   CREATININE 0.55 0.80 0.68  --   CALCIUM 8.2*  --  8.2*  --   MG  --   --   --  1.3*  AST 12*  --  16  --   ALT 8*  --  8*  --   ALKPHOS 109  --  97  --   BILITOT 0.2*  --  0.3  --    ------------------------------------------------------------------------------------------------------------------ estimated creatinine clearance is 63.4 mL/min (by C-G formula based on Cr of 0.68). ------------------------------------------------------------------------------------------------------------------ No results for input(s): HGBA1C in the last 72 hours. ------------------------------------------------------------------------------------------------------------------ No results for input(s): CHOL, HDL, LDLCALC, TRIG, CHOLHDL, LDLDIRECT in the last 72 hours. ------------------------------------------------------------------------------------------------------------------ No results for input(s): TSH, T4TOTAL, T3FREE, THYROIDAB in the last 72 hours.  Invalid input(s): FREET3 ------------------------------------------------------------------------------------------------------------------ No results for input(s): VITAMINB12, FOLATE, FERRITIN, TIBC, IRON, RETICCTPCT in the last 72 hours.  Coagulation profile  Recent Labs Lab 09/02/14 0143  INR 1.04    No results for input(s): DDIMER in the last 72 hours.  Cardiac Enzymes No results for input(s): CKMB, TROPONINI, MYOGLOBIN in the last 168 hours.  Invalid input(s):  CK ------------------------------------------------------------------------------------------------------------------ Invalid input(s): POCBNP    Caydn Justen D.O. on 09/03/2014 at 11:32 AM  Between 7am to 7pm - Pager -  [phone]  After 7pm go to [email or web address] - password [email or web address]  And look for the night coverage person covering for me after hours  [facility] Office  [phone]

## 2014-09-03 NOTE — Progress Notes (Signed)
ANTIBIOTIC CONSULT NOTE - INITIAL  Pharmacy Consult for Primaxin Indication: UTI & aspiration pneumonia  Allergies  Allergen Reactions  . Erythromycin Nausea And Vomiting  . Nitrofurantoin Monohyd Macro Nausea And Vomiting  . Penicillins Hives    Patient Measurements: Height: 5\' 2"  (157.5 cm) Weight: 97 lb 10.6 oz (44.3 kg) IBW/kg (Calculated) : 50.1  Vital Signs: Temp: 102.5 F (39.2 C) (08/14 0117) Temp Source: Oral (08/14 0117) BP: 79/42 mmHg (08/14 0117) Pulse Rate: 95 (08/14 0117) Intake/Output from previous day: 08/13 0701 - 08/14 0700 In: 1862.2 [P.O.:360; I.V.:1302.2; IV Piggyback:200] Out: -  Intake/Output from this shift: Total I/O In: 247.5 [I.V.:247.5] Out: -   Labs:  Recent Labs  09/02/14 0143 09/02/14 0158 09/03/14 0010  WBC 12.7*  --  6.7  HGB 10.3* 10.5* 8.9*  PLT 439*  --  311  CREATININE 0.55 0.80 0.68   Estimated Creatinine Clearance: 63.4 mL/min (by C-G formula based on Cr of 0.68). No results for input(s): VANCOTROUGH, VANCOPEAK, VANCORANDOM, GENTTROUGH, GENTPEAK, GENTRANDOM, TOBRATROUGH, TOBRAPEAK, TOBRARND, AMIKACINPEAK, AMIKACINTROU, AMIKACIN in the last 72 hours.   Microbiology: Recent Results (from the past 720 hour(s))  MRSA PCR Screening     Status: None   Collection Time: 09/02/14  4:39 AM  Result Value Ref Range Status   MRSA by PCR NEGATIVE NEGATIVE Final    Comment:        The GeneXpert MRSA Assay (FDA approved for NASAL specimens only), is one component of a comprehensive MRSA colonization surveillance program. It is not intended to diagnose MRSA infection nor to guide or monitor treatment for MRSA infections.     Medical History: Past Medical History  Diagnosis Date  . Asthma   . Pleurisy   . GERD (gastroesophageal reflux disease)   . Quadriparesis   . Pacemaker     Medications:  Scheduled:  . baclofen  20 mg Oral TID  . citalopram  20 mg Oral Daily  . DULoxetine  20 mg Oral BID  . ferrous sulfate  325  mg Oral TID PC  . gabapentin  300 mg Oral TID  . heparin  5,000 Units Subcutaneous 3 times per day  . imipenem-cilastatin  250 mg Intravenous Q8H  . pantoprazole  40 mg Oral Daily  . potassium chloride  40 mEq Oral BID  . sodium chloride  1,000 mL Intravenous Once   Infusions:  . sodium chloride 75 mL/hr at 09/02/14 1918   Assessment:  On 8/13, 44 yr female brought to ED unresponsive.  Being treated for OD of reported roxicodone. PMH include quadriparesis, asthma, pleurisy, pacemaker. Concern for UTI; urinary catheter recently removed.  Reported at home fever and pain at site of catheter. Pharmacy consulted to dose Cipro for suspected UTI  Blood & Urine culture ordered  8/14: Patient with Temp 102.36F and concern of aspiration.  Chest xray suspects infection  Cipro d/c'ed and pharmacy consulted to dose Primaxin for treatment of UTI and suspected aspiration pneumonia in this PCN allergic patient  CrCl ~ 63 ml/min  Goal of Therapy:  Eradication of infection  Plan:  Follow up culture results  Primaxin 250mg  IV q8h  Everette Rank, PharmD 09/03/2014,2:02 AM

## 2014-09-03 NOTE — Progress Notes (Addendum)
Paged NP with lactic acid results and K+ results.

## 2014-09-04 DIAGNOSIS — J189 Pneumonia, unspecified organism: Secondary | ICD-10-CM | POA: Insufficient documentation

## 2014-09-04 DIAGNOSIS — IMO0001 Reserved for inherently not codable concepts without codable children: Secondary | ICD-10-CM | POA: Insufficient documentation

## 2014-09-04 LAB — CBC
HCT: 25.6 % — ABNORMAL LOW (ref 36.0–46.0)
Hemoglobin: 8.2 g/dL — ABNORMAL LOW (ref 12.0–15.0)
MCH: 28.1 pg (ref 26.0–34.0)
MCHC: 32 g/dL (ref 30.0–36.0)
MCV: 87.7 fL (ref 78.0–100.0)
Platelets: 283 10*3/uL (ref 150–400)
RBC: 2.92 MIL/uL — ABNORMAL LOW (ref 3.87–5.11)
RDW: 17.3 % — ABNORMAL HIGH (ref 11.5–15.5)
WBC: 15.3 10*3/uL — ABNORMAL HIGH (ref 4.0–10.5)

## 2014-09-04 LAB — BASIC METABOLIC PANEL
Anion gap: 5 (ref 5–15)
BUN: 8 mg/dL (ref 6–20)
CO2: 20 mmol/L — ABNORMAL LOW (ref 22–32)
Calcium: 8.3 mg/dL — ABNORMAL LOW (ref 8.9–10.3)
Chloride: 108 mmol/L (ref 101–111)
Creatinine, Ser: 0.41 mg/dL — ABNORMAL LOW (ref 0.44–1.00)
GFR calc Af Amer: >60 mL/min (ref >60)
GFR calc non Af Amer: >60 mL/min (ref >60)
Glucose, Bld: 90 mg/dL (ref 65–99)
Potassium: 5.1 mmol/L (ref 3.5–5.1)
Sodium: 133 mmol/L — ABNORMAL LOW (ref 135–145)

## 2014-09-04 LAB — URINE CULTURE

## 2014-09-04 LAB — MAGNESIUM: Magnesium: 1.7 mg/dL (ref 1.7–2.4)

## 2014-09-04 MED ORDER — MAGNESIUM SULFATE 2 GM/50ML IV SOLN
2.0000 g | Freq: Once | INTRAVENOUS | Status: AC
  Administered 2014-09-04: 2 g via INTRAVENOUS
  Filled 2014-09-04: qty 50

## 2014-09-04 MED ORDER — VANCOMYCIN HCL 500 MG IV SOLR
500.0000 mg | Freq: Two times a day (BID) | INTRAVENOUS | Status: DC
  Administered 2014-09-05 (×2): 500 mg via INTRAVENOUS
  Filled 2014-09-04 (×3): qty 500

## 2014-09-04 MED ORDER — VANCOMYCIN HCL IN DEXTROSE 750-5 MG/150ML-% IV SOLN
750.0000 mg | Freq: Once | INTRAVENOUS | Status: AC
  Administered 2014-09-04: 750 mg via INTRAVENOUS
  Filled 2014-09-04: qty 150

## 2014-09-04 MED ORDER — DEXTROSE 5 % IV SOLN
1.0000 g | Freq: Three times a day (TID) | INTRAVENOUS | Status: DC
  Administered 2014-09-04 – 2014-09-06 (×7): 1 g via INTRAVENOUS
  Filled 2014-09-04 (×8): qty 1

## 2014-09-04 NOTE — Progress Notes (Signed)
[facility]                                                                              Patient Demographics  Angela Page, is a 44 y.o. female, DOB - 12-25-1970, HQI:[id number]  Admit date - 09/02/2014   Admitting Physician Etta Quill, DO  Outpatient Primary MD for the patient is No PCP Per Patient  LOS - 2   Chief Complaint  Patient presents with  . Drug Overdose      HPI on 09/02/2014 by Dr. Jennette Kettle Angela Page is a 44 y.o. female with h/o quadriparesis, pacemaker placement, sacral decubitus. Patient is brought in to the ED hypotensive and unresponsive after she "snorted a Roxicodone". She required total of 3mg  of narcan in the ED to get her responsive. She is now awake and responding appropriately to questions. Last had 1mg  narcan 10 mins ago.  Assessment & Plan  Sepsis possibly secondary to questionable pneumonia -Patient continues to be febrile and has leukocytosis this morning. -Repeat chest x-ray 8/14: Faintly increased density in the left lung could reflect mild infection -Spoke with infectious disease, Dr. Megan Salon, who recommended ceftazidime -Will start Tressie Ellis today and continue to monitor CBC  Drug overdose/AMS -Resolved -Patient currently awake and alert. Admitted to snorting Roxicodone yesterday evening. States that she only used one extra pill. -Was placed on Narcan drip, will discontinue -Vital signs currently stable. Blood pressure does run on the softer side. -Tox screen positive for opiates  Normocytic Anemia -Hemoglobin since July has been approximately 9-10 -Hemoglobin Currently 8.2 (drop likely due to IVF), continue to monitor CBC  Quadriplegia -Stable, able to move upper extremities however not able to use hands. -Continue baclofen -Foley catheter reinserted  Hematuria -UA showed large urine dipstick, red urine, large leukocytes -Likely secondary to ? trauma as Foley was pulled out -Foley reinserted, no further frank  hematuria noted  Depression -Continue Celexa, Cymbalta  Prolonged QT  -Will obtain repeat EKG -Continue to replace magnesium -Potassium replaced  Hypokalemia/Hypomagnesemia -Hypokalemia resolved -Replacing magnesium, continue to monitor  Code Status: Full  Family Communication: None at bedside  Disposition Plan: Admitted. Will continue to monitor for 24 hours, if patient remains afebrile, will discharge 09/05/2014.  Time Spent in minutes 30 minutes  Procedures  None  Consults  None  DVT Prophylaxis Heparin  Lab Results  Component Value Date   PLT 283 09/04/2014    Medications  Scheduled Meds: . baclofen  20 mg Oral TID  . cefTAZidime (FORTAZ)  IV  1 g Intravenous Q8H  . citalopram  20 mg Oral Daily  . DULoxetine  20 mg Oral BID  . feeding supplement (ENSURE ENLIVE)  237 mL Oral BID BM  . ferrous sulfate  325 mg Oral TID PC  . gabapentin  300 mg Oral TID  . heparin  5,000 Units Subcutaneous 3 times per day  . pantoprazole  40 mg Oral Daily   Continuous Infusions: . sodium chloride 75 mL/hr at 09/03/14 2102   PRN Meds:.acetaminophen, albuterol, oxyCODONE  Antibiotics    Anti-infectives    Start     Dose/Rate Route Frequency Ordered Stop   09/04/14 1000  cefTAZidime (FORTAZ) 1 g  in dextrose 5 % 50 mL IVPB     1 g 100 mL/hr over 30 Minutes Intravenous Every 8 hours 09/04/14 0905     09/03/14 1700  cefUROXime (CEFTIN) tablet 500 mg  Status:  Discontinued     500 mg Oral 2 times daily with meals 09/03/14 1214 09/04/14 0832   09/03/14 0200  imipenem-cilastatin (PRIMAXIN) 250 mg in sodium chloride 0.9 % 100 mL IVPB  Status:  Discontinued     250 mg 200 mL/hr over 30 Minutes Intravenous Every 8 hours 09/03/14 0153 09/03/14 1142   09/02/14 2359  cefTRIAXone (ROCEPHIN) 1 g in dextrose 5 % 50 mL IVPB  Status:  Discontinued     1 g 100 mL/hr over 30 Minutes Intravenous Every 24 hours 09/02/14 2344 09/03/14 0139   09/02/14 1700  ciprofloxacin (CIPRO) IVPB  400 mg  Status:  Discontinued     400 mg 200 mL/hr over 60 Minutes Intravenous Every 12 hours 09/02/14 0338 09/02/14 2344   09/02/14 0345  ciprofloxacin (CIPRO) IVPB 400 mg     400 mg 200 mL/hr over 60 Minutes Intravenous STAT 09/02/14 0337 09/02/14 0445      Subjective:   Angela Page seen and examined today. Patient currently denies any pain. She denies any chest pain, shortness of breath or abdominal pain. Would like to go home.  Denies cough.   Objective:   Filed Vitals:   09/03/14 1514 09/03/14 2020 09/04/14 0514 09/04/14 0652  BP: 113/77 89/53 124/84   Pulse:  80 87   Temp:  97.9 F (36.6 C) 100.8 F (38.2 C) 100.8 F (38.2 C)  TempSrc:  Oral Oral Oral  Resp:   18   Height:      Weight:      SpO2:  98% 97%     Wt Readings from Last 3 Encounters:  09/02/14 44.3 kg (97 lb 10.6 oz)  08/02/14 49.9 kg (110 lb 0.2 oz)  09/16/13 46.72 kg (103 lb)     Intake/Output Summary (Last 24 hours) at 09/04/14 1021 Last data filed at 09/04/14 0700  Gross per 24 hour  Intake   2470 ml  Output   1850 ml  Net    620 ml    Exam  General: Well developed, well nourished, No distress  HEENT: NCAT, mucous membranes moist. Poor dentition  Cardiovascular: S1 S2 auscultated, soft murmur, RRR  Respiratory: Clear to auscultation bilaterally  Abdomen: Soft, nontender, nondistended, + bowel sounds  Extremities: warm dry without cyanosis clubbing or edema- contracted upper extremities  Neuro: AAOx3, nonfocal, qudraplegia  Data Review   Micro Results Recent Results (from the past 240 hour(s))  Urine culture     Status: None (Preliminary result)   Collection Time: 09/02/14  1:14 AM  Result Value Ref Range Status   Specimen Description URINE, CATHETERIZED  Final   Special Requests NONE  Final   Culture   Final    >=100,000 COLONIES/mL PROTEUS MIRABILIS Performed at [facility]    Report Status PENDING  Incomplete  MRSA PCR Screening     Status: None    Collection Time: 09/02/14  4:39 AM  Result Value Ref Range Status   MRSA by PCR NEGATIVE NEGATIVE Final    Comment:        The GeneXpert MRSA Assay (FDA approved for NASAL specimens only), is one component of a comprehensive MRSA colonization surveillance program. It is not intended to diagnose MRSA infection nor to guide or monitor treatment for MRSA  infections.   Culture, blood (routine x 2)     Status: None (Preliminary result)   Collection Time: 09/03/14  2:30 AM  Result Value Ref Range Status   Specimen Description   Final    BLOOD RIGHT HAND Performed at [facility]    Special Requests BOTTLES DRAWN AEROBIC ONLY 5CC  Final   Culture PENDING  Incomplete   Report Status PENDING  Incomplete    Radiology Reports Dg Chest 2 View  09/03/2014   CLINICAL DATA:  Acute onset of fever.  Initial encounter.  EXAM: CHEST  2 VIEW  COMPARISON:  Chest radiograph performed 09/02/2014  FINDINGS: Vaguely increased density at the left lung could reflect mild infection. No pleural effusion or pneumothorax is seen.  The cardiomediastinal silhouette is normal in size. A pacemaker is noted at the left chest wall, with leads ending at the right atrium and right ventricle. No acute osseous abnormalities are identified.  IMPRESSION: Vaguely increased density at the left lung could reflect mild infection. Would correlate for associated symptoms.   Electronically Signed   By: Garald Balding M.D.   On: 09/03/2014 01:15   Dg Chest Port 1 View  09/02/2014   CLINICAL DATA:  Unresponsive.  Altered mental status.  EXAM: PORTABLE CHEST - 1 VIEW  COMPARISON:  One-view chest x-ray 08/01/2014.  FINDINGS: Heart size is normal. Mild interstitial edema is present. There is no effusion. Pacing wires are stable in position. The visualized soft tissues and bony thorax are unremarkable.  IMPRESSION: 1. Mild interstitial edema without focal airspace disease.   Electronically Signed   By: San Morelle M.D.   On:  09/02/2014 01:56    CBC  Recent Labs Lab 09/02/14 0143 09/02/14 0158 09/03/14 0010 09/04/14 0506  WBC 12.7*  --  6.7 15.3*  HGB 10.3* 10.5* 8.9* 8.2*  HCT 32.9* 31.0* 29.4* 25.6*  PLT 439*  --  311 283  MCV 88.4  --  89.1 87.7  MCH 27.7  --  27.0 28.1  MCHC 31.3  --  30.3 32.0  RDW 17.2*  --  16.7* 17.3*  LYMPHSABS 1.4  --  0.3*  --   MONOABS 1.1*  --  0.2  --   EOSABS 0.2  --  0.0  --   BASOSABS 0.0  --  0.0  --     Chemistries   Recent Labs Lab 09/02/14 0143 09/02/14 0158 09/03/14 0010 09/03/14 0017 09/04/14 0506  NA 139 138 134*  --  133*  K 3.5 3.6 3.0*  --  5.1  CL 106 104 107  --  108  CO2 25  --  17*  --  20*  GLUCOSE 112* 110* 119*  --  90  BUN 16 18 11   --  8  CREATININE 0.55 0.80 0.68  --  0.41*  CALCIUM 8.2*  --  8.2*  --  8.3*  MG  --   --   --  1.3* 1.7  AST 12*  --  16  --   --   ALT 8*  --  8*  --   --   ALKPHOS 109  --  97  --   --   BILITOT 0.2*  --  0.3  --   --    ------------------------------------------------------------------------------------------------------------------ estimated creatinine clearance is 63.4 mL/min (by C-G formula based on Cr of 0.41). ------------------------------------------------------------------------------------------------------------------ No results for input(s): HGBA1C in the last 72 hours. ------------------------------------------------------------------------------------------------------------------ No results for input(s): CHOL, HDL, LDLCALC, TRIG, CHOLHDL,  LDLDIRECT in the last 72 hours. ------------------------------------------------------------------------------------------------------------------ No results for input(s): TSH, T4TOTAL, T3FREE, THYROIDAB in the last 72 hours.  Invalid input(s): FREET3 ------------------------------------------------------------------------------------------------------------------ No results for input(s): VITAMINB12, FOLATE, FERRITIN, TIBC, IRON, RETICCTPCT in the  last 72 hours.  Coagulation profile  Recent Labs Lab 09/02/14 0143  INR 1.04    No results for input(s): DDIMER in the last 72 hours.  Cardiac Enzymes No results for input(s): CKMB, TROPONINI, MYOGLOBIN in the last 168 hours.  Invalid input(s): CK ------------------------------------------------------------------------------------------------------------------ Invalid input(s): POCBNP    Jalyiah Shelley D.O. on 09/04/2014 at 10:21 AM  Between 7am to 7pm - Pager - [phone]  After 7pm go to [email or web address] - password [email or web address]  And look for the night coverage person covering for me after hours  [facility] Office  [phone]

## 2014-09-04 NOTE — Progress Notes (Signed)
Patient complained she was cold. Temp was 101.2 oral. Administered PRN Tylenol. MD notified.

## 2014-09-04 NOTE — Progress Notes (Signed)
Pharmacy - Antibiotic dosing  Assessment: See earlier note from Netta C[place], [facility], for full details.  40 yoF on ceftazidime for UTI with possible PNA; now to add vancomycin per pharmacy with continued fevers.  Temp: 101.2 WBC: moderately elevated Renal: SCr 41; CrCl 63 CG  Plan:  Vancomycin 750 mg IV now, then 500 mg IV q12 hr  Measure vancomycin trough levels at steady state as indicated  Goal trough 15-20 mcg/mL   Reuel Boom, PharmD, BCPS Pager: [phone] 09/04/2014, 7:50 PM

## 2014-09-04 NOTE — Progress Notes (Signed)
Initial Nutrition Assessment  DOCUMENTATION CODES:   Severe malnutrition in context of chronic illness, Underweight  INTERVENTION:  - Will order Ensure Enlive BID, each supplement provides 350 kcal and 20 grams protein - Will order Prostat BID, each supplement provides 100 kcal and 15 grams protein - RD will continue to monitor for needs  NUTRITION DIAGNOSIS:   Increased nutrient needs related to wound healing as evidenced by estimated needs.  GOAL:   Patient will meet greater than or equal to 90% of their needs  MONITOR:   PO intake, Supplement acceptance, Weight trends, Labs, Skin  REASON FOR ASSESSMENT:   Malnutrition Screening Tool  ASSESSMENT:   44 y.o. female with h/o quadriparesis, pacemaker placement, sacral decubitus. Patient is brought in to the ED hypotensive and unresponsive after she "snorted a Roxicodone". She required total of 3mg  of narcan in the ED to get her responsive. She is now awake and responding appropriately to questions. Last had 1mg  narcan 10 mins ago.  Pt seen for MST. BMI indicates underweight status. Pt has had 50% intakes of meals since admission, on average. She states she has not yet had breakfast or lunch today. Pt reports at home she typically eats lunch ~1400 and dinner ~1800 and sometimes has snacks between meals. These meals typically consist of a protein source such as chicken or hamburger.  She states that wound care MD at the wound care clinic she goes to has advised her to drink protein shakes made with egg white protein powder; pt typically drinks 1 shake/day.  She feels that she has been gaining weight recently but states she also has been experiencing mild edema and is unsure if weight gain may be due to this. Per weight hx review, pt has lost 13 lbs (12% body weight) in the past month which is significant for time frame.  Not meeting needs, especially protein needs. Will order supplements to assist. Severe muscle and fat wasting  noted. Medications reviewed. Labs reviewed; Na: 133 mmol/L, creatinine low, Ca: 8.3 mg/dL.  Diet Order:  Diet regular Room service appropriate?: Yes; Fluid consistency:: Thin  Skin:  Wound (see comment) (stage 4 sacral and L buttocks pressure ulcers)  Last BM:  8/14  Height:   Ht Readings from Last 1 Encounters:  09/02/14 5\' 2"  (1.575 m)    Weight:   Wt Readings from Last 1 Encounters:  09/02/14 97 lb 10.6 oz (44.3 kg)    Ideal Body Weight:  50 kg (kg)  BMI:  Body mass index is 17.86 kg/(m^2).  Estimated Nutritional Needs:   Kcal:  1550-1750  Protein:  80-90 grams  Fluid:  >2.5 L/day  EDUCATION NEEDS:   No education needs identified at this time     Jarome Matin, RD, LDN Inpatient Clinical Dietitian Pager # [phone] After hours/weekend pager # [phone]

## 2014-09-04 NOTE — Consult Note (Signed)
WOC wound consult note Reason for Consult: Nonhealing, Stage IV pressure ulcers to sacrum and left ischium Wound type:Chronic, nonhealing pressure ulcers.  Pressure Ulcer POA: Yes Measurement: Sacrum 6 cm x 4 cm x 3 cm  Left ischium 4 cm x 3.3 cm x 2 cm Wound bed:Both are pale pink and nongranulating.  Was not improving with NPWT and MD changed over to daily Dakin's dressings.  There is no devitalized tissue and no purulence to drainage.  Drainage (amount, consistency, odor) Moderate serosanguinous drainage.  No odor.  Periwound:Intact Dressing procedure/placement/frequency:Cleanse ulcers to sacrum and left ischium with NS and pat gently dry.  Apply NS moist gauze to wound bed.  Cover with ABD pad and tape.  Change daily.  Will not follow at this time.  Please re-consult if needed.  Domenic Moras RN BSN [place] Pager [phone]

## 2014-09-04 NOTE — Progress Notes (Signed)
[place] for [facility] Indication: UTI & aspiration pneumonia  Allergies  Allergen Reactions  . Erythromycin Nausea And Vomiting  . Nitrofurantoin Monohyd Macro Nausea And Vomiting  . Penicillins Hives    Patient Measurements: Height: 5\' 2"  (157.5 cm) Weight: 97 lb 10.6 oz (44.3 kg) IBW/kg (Calculated) : 50.1  Vital Signs: Temp: 100.8 F (38.2 C) (08/15 0652) Temp Source: Oral (08/15 0652) BP: 124/84 mmHg (08/15 0514) Pulse Rate: 87 (08/15 0514) Intake/Output from previous day: 08/14 0701 - 08/15 0700 In: 3345 [P.O.:1080; I.V.:2265] Out: 2450 [Urine:2450] Intake/Output from this shift:    Labs:  Recent Labs  09/02/14 0143 09/02/14 0158 09/03/14 0010 09/04/14 0506  WBC 12.7*  --  6.7 15.3*  HGB 10.3* 10.5* 8.9* 8.2*  PLT 439*  --  311 283  CREATININE 0.55 0.80 0.68 0.41*   Estimated Creatinine Clearance: 63.4 mL/min (by C-G formula based on Cr of 0.41). No results for input(s): VANCOTROUGH, VANCOPEAK, VANCORANDOM, GENTTROUGH, GENTPEAK, GENTRANDOM, TOBRATROUGH, TOBRAPEAK, TOBRARND, AMIKACINPEAK, AMIKACINTROU, AMIKACIN in the last 72 hours.   Microbiology: Recent Results (from the past 720 hour(s))  Urine culture     Status: None (Preliminary result)   Collection Time: 09/02/14  1:14 AM  Result Value Ref Range Status   Specimen Description URINE, CATHETERIZED  Final   Special Requests NONE  Final   Culture   Final    >=100,000 COLONIES/mL PROTEUS MIRABILIS Performed at [facility]    Report Status PENDING  Incomplete  MRSA PCR Screening     Status: None   Collection Time: 09/02/14  4:39 AM  Result Value Ref Range Status   MRSA by PCR NEGATIVE NEGATIVE Final    Comment:        The GeneXpert MRSA Assay (FDA approved for NASAL specimens only), is one component of a comprehensive MRSA colonization surveillance program. It is not intended to diagnose MRSA infection nor to guide or monitor treatment for MRSA  infections.   Culture, blood (routine x 2)     Status: None (Preliminary result)   Collection Time: 09/03/14  2:30 AM  Result Value Ref Range Status   Specimen Description   Final    BLOOD RIGHT HAND Performed at [name]    Special Requests BOTTLES DRAWN AEROBIC ONLY 5CC  Final   Culture PENDING  Incomplete   Report Status PENDING  Incomplete    Medical History: Past Medical History  Diagnosis Date  . Asthma   . Pleurisy   . GERD (gastroesophageal reflux disease)   . Quadriparesis   . Pacemaker     Medications:  Scheduled:  . baclofen  20 mg Oral TID  . citalopram  20 mg Oral Daily  . DULoxetine  20 mg Oral BID  . feeding supplement (ENSURE ENLIVE)  237 mL Oral BID BM  . ferrous sulfate  325 mg Oral TID PC  . gabapentin  300 mg Oral TID  . heparin  5,000 Units Subcutaneous 3 times per day  . pantoprazole  40 mg Oral Daily   Infusions:  . sodium chloride 75 mL/hr at 09/03/14 2102   Assessment:  On 8/13, 44 yo female brought to ED unresponsive.  Being treated for OD of reported roxicodone. PMH include quadriparesis, asthma, pleurisy, pacemaker. Concern for UTI; urinary catheter recently removed.  Reported at home fever and pain at site of catheter. Pharmacy consulted to dose Cipro for suspected UTI  Blood & Urine culture ordered  8/14: Patient with Temp  102.54F and concern of aspiration.  Chest xray suspects infection  Cipro d/c'ed and pharmacy consulted to dose Primaxin for treatment of UTI and suspected aspiration pneumonia in this PCN allergic patient  8/15: Primaxin changed to po Ceftin, however still with low grade fever and leukocytosis.  Urine cx + 100K Proteus.  CXR shows possible infection.  Pt has prolonged QTc interval at baseline.  Coverage broadened to [facility] per ID recommendation.   Goal of Therapy:  Eradication of infection  Plan:  Fortaz 1gm IV q8h Follow up culture results   Huberta Tompkins, Lavonia Drafts, PharmD 09/04/2014,8:59 AM

## 2014-09-04 NOTE — Care Management Note (Signed)
Case Management Note  Patient Details  Name: Angela Page MRN: [id number] Date of Birth: 02-06-1970  Subjective/Objective:        Admitted s/p OD            Action/Plan: Discharge planning, spoke with patient at d/c. Patient states she has a nurse that comes for wound care weekly from [facility], has a sacral decub. She recently had a wound vac but it was d/ced, she states she expects she will need it again. Patient states she lives at home with her mother and 45 yo daughter, states her ex husband is still very involved in her care and comes daily to assist with ADL's and get her OOB. She has a hospital bed currently with specialty mattress, she had a lift but returned it as she said it was complicated to use and her ex husband was able to lift her easily. She uses an ambulance for transportation to appt, she will need assistance with transport at d/c, CSW notified.   Expected Discharge Date:  09/04/14               Expected Discharge Plan:  [place]  In-House Referral:  Clinical Social Work  Discharge planning Services  CM Consult  Post Acute Care Choice:  Home Health Choice offered to:  Patient  DME Arranged:  N/A DME Agency:  [place] Arranged:  RN, Disease Management [place] Agency:  [facility]  Status of Service:  Completed, signed off  Medicare Important Message Given:    Date Medicare IM Given:    Medicare IM give by:    Date Additional Medicare IM Given:    Additional Medicare Important Message give by:     If discussed at [place] of Stay Meetings, dates discussed:    Additional Comments:  Guadalupe Maple, RN 09/04/2014, 10:50 AM

## 2014-09-05 ENCOUNTER — Inpatient Hospital Stay (HOSPITAL_COMMUNITY): Payer: Medicaid Other

## 2014-09-05 DIAGNOSIS — E43 Unspecified severe protein-calorie malnutrition: Secondary | ICD-10-CM | POA: Insufficient documentation

## 2014-09-05 LAB — BASIC METABOLIC PANEL
ANION GAP: 7 (ref 5–15)
BUN: 8 mg/dL (ref 6–20)
CHLORIDE: 108 mmol/L (ref 101–111)
CO2: 23 mmol/L (ref 22–32)
Calcium: 8.7 mg/dL — ABNORMAL LOW (ref 8.9–10.3)
Creatinine, Ser: 0.41 mg/dL — ABNORMAL LOW (ref 0.44–1.00)
GFR calc Af Amer: >60 mL/min (ref >60)
GLUCOSE: 94 mg/dL (ref 65–99)
POTASSIUM: 4.3 mmol/L (ref 3.5–5.1)
Sodium: 138 mmol/L (ref 135–145)

## 2014-09-05 LAB — CBC
HCT: 25.7 % — ABNORMAL LOW (ref 36.0–46.0)
HEMOGLOBIN: 8 g/dL — AB (ref 12.0–15.0)
MCH: 27.4 pg (ref 26.0–34.0)
MCHC: 31.1 g/dL (ref 30.0–36.0)
MCV: 88 fL (ref 78.0–100.0)
PLATELETS: 245 10*3/uL (ref 150–400)
RBC: 2.92 MIL/uL — AB (ref 3.87–5.11)
RDW: 17.2 % — ABNORMAL HIGH (ref 11.5–15.5)
WBC: 12.5 10*3/uL — AB (ref 4.0–10.5)

## 2014-09-05 LAB — MAGNESIUM: MAGNESIUM: 2 mg/dL (ref 1.7–2.4)

## 2014-09-05 NOTE — Progress Notes (Signed)
[facility]                                                                              Patient Demographics  Angela Page, is a 44 y.o. female, DOB - 03/06/1970, IEP:[id number]  Admit date - 09/02/2014   Admitting Physician Etta Quill, DO  Outpatient Primary MD for the patient is No PCP Per Patient  LOS - 3   Chief Complaint  Patient presents with  . Drug Overdose      HPI on 09/02/2014 by Dr. Jennette Kettle Angela Page is a 45 y.o. female with h/o quadriparesis, pacemaker placement, sacral decubitus. Patient is brought in to the ED hypotensive and unresponsive after she "snorted a Roxicodone". She required total of 3mg  of narcan in the ED to get her responsive. She is now awake and responding appropriately to questions. Last had 1mg  narcan 10 mins ago.  Interim history Patient continues to spike fevers.  Repeat CXR today (8/16) clear.  Currently on vanc/fortaz.  Bl cultures show no growth.  Urine cx pansensitive Proteus.  Assessment & Plan  Sepsis possibly secondary to questionable pneumonia vs UTI -Patient continues to be febrile and has leukocytosis this morning. -Repeat chest x-ray 8/14: Faintly increased density in the left lung could reflect mild infection -Spoke with infectious disease on 8/15, Dr. Megan Salon recommended ceftazidime -Repeat CXR 8/16: No acute cardiopulmonary disease  -Urine culture >100K Proteus, pansensitive -Blood cultures show no growth -Patient spiked fever overnight again.  Vancomycin added.  Drug overdose/AMS -Resolved -Patient currently awake and alert. Admitted to snorting Roxicodone yesterday evening. States that she only used one extra pill. -Was placed on Narcan drip, will discontinue -Vital signs currently stable. Blood pressure does run on the softer side. -Tox screen positive for opiates  Normocytic Anemia -Hemoglobin since July has been approximately 9-10 -Hemoglobin Currently 8.0 (drop likely due to IVF), continue  to monitor CBC -Anemia panel 08/02/14:  Iron 22, Ferritin 79 -Continue iron supplementation  Quadriplegia -Stable, able to move upper extremities however not able to use hands. -Continue baclofen -Foley catheter reinserted  Hematuria -UA showed large urine dipstick, red urine, large leukocytes -Likely secondary to ? trauma as Foley was pulled out -Foley reinserted, no further frank hematuria noted  Depression -Continue Celexa, Cymbalta  Prolonged QT  -Will obtain repeat EKG -Continue to replace magnesium -Potassium replaced  Hypokalemia/Hypomagnesemia -Resolved   Code Status: Full  Family Communication: None at bedside  Disposition Plan: Admitted. Will continue to monitor for 24 hours, if patient remains afebrile,  discharge 09/06/2014.  Time Spent in minutes 30 minutes  Procedures  None  Consults  None  DVT Prophylaxis Heparin  Lab Results  Component Value Date   PLT 245 09/05/2014    Medications  Scheduled Meds: . baclofen  20 mg Oral TID  . cefTAZidime (FORTAZ)  IV  1 g Intravenous Q8H  . citalopram  20 mg Oral Daily  . DULoxetine  20 mg Oral BID  . feeding supplement (ENSURE ENLIVE)  237 mL Oral BID BM  . ferrous sulfate  325 mg Oral TID PC  . gabapentin  300 mg Oral TID  . heparin  5,000 Units Subcutaneous 3 times per  day  . pantoprazole  40 mg Oral Daily  . vancomycin  500 mg Intravenous Q12H   Continuous Infusions: . sodium chloride 75 mL/hr at 09/05/14 0126   PRN Meds:.acetaminophen, albuterol, oxyCODONE  Antibiotics    Anti-infectives    Start     Dose/Rate Route Frequency Ordered Stop   09/05/14 0800  vancomycin (VANCOCIN) 500 mg in sodium chloride 0.9 % 100 mL IVPB     500 mg 100 mL/hr over 60 Minutes Intravenous Every 12 hours 09/04/14 1952     09/04/14 2000  vancomycin (VANCOCIN) IVPB 750 mg/150 ml premix     750 mg 150 mL/hr over 60 Minutes Intravenous  Once 09/04/14 1952 09/04/14 2158   09/04/14 1000  cefTAZidime (FORTAZ)  1 g in dextrose 5 % 50 mL IVPB     1 g 100 mL/hr over 30 Minutes Intravenous Every 8 hours 09/04/14 0905     09/03/14 1700  cefUROXime (CEFTIN) tablet 500 mg  Status:  Discontinued     500 mg Oral 2 times daily with meals 09/03/14 1214 09/04/14 0832   09/03/14 0200  imipenem-cilastatin (PRIMAXIN) 250 mg in sodium chloride 0.9 % 100 mL IVPB  Status:  Discontinued     250 mg 200 mL/hr over 30 Minutes Intravenous Every 8 hours 09/03/14 0153 09/03/14 1142   09/02/14 2359  cefTRIAXone (ROCEPHIN) 1 g in dextrose 5 % 50 mL IVPB  Status:  Discontinued     1 g 100 mL/hr over 30 Minutes Intravenous Every 24 hours 09/02/14 2344 09/03/14 0139   09/02/14 1700  ciprofloxacin (CIPRO) IVPB 400 mg  Status:  Discontinued     400 mg 200 mL/hr over 60 Minutes Intravenous Every 12 hours 09/02/14 0338 09/02/14 2344   09/02/14 0345  ciprofloxacin (CIPRO) IVPB 400 mg     400 mg 200 mL/hr over 60 Minutes Intravenous STAT 09/02/14 0337 09/02/14 0445      Subjective:   [name] seen and examined today. Patient currently denies any pain. She denies any chest pain, shortness of breath or abdominal pain. Would like to go home.  Denies cough. Felt she warm overnight with chills.   Objective:   Filed Vitals:   09/04/14 1829 09/04/14 2038 09/04/14 2354 09/05/14 0520  BP:  130/82  125/77  Pulse:  87  80  Temp: 101.2 F (38.4 C) 101.6 F (38.7 C) 98.7 F (37.1 C) 98.1 F (36.7 C)  TempSrc: Oral Oral Oral Oral  Resp:  18  18  Height:      Weight:      SpO2:  97%  95%    Wt Readings from Last 3 Encounters:  09/02/14 44.3 kg (97 lb 10.6 oz)  08/02/14 49.9 kg (110 lb 0.2 oz)  09/16/13 46.72 kg (103 lb)     Intake/Output Summary (Last 24 hours) at 09/05/14 1217 Last data filed at 09/05/14 1200  Gross per 24 hour  Intake   1980 ml  Output   5325 ml  Net  -3345 ml    Exam  General: Well developed, well nourished, No distress  HEENT: NCAT, mucous membranes moist. Poor  dentition  Cardiovascular: S1 S2 auscultated, soft murmur, RRR  Respiratory: Clear to auscultation bilaterally  Abdomen: Soft, nontender, nondistended, + bowel sounds  Extremities: warm dry without cyanosis clubbing or edema- contracted upper extremities  Neuro: AAOx3, nonfocal, qudraplegia  Psych: appropriate mood and affect  Data Review   Micro Results Recent Results (from the past 240 hour(s))  Urine culture  Status: None   Collection Time: 09/02/14  1:14 AM  Result Value Ref Range Status   Specimen Description URINE, CATHETERIZED  Final   Special Requests NONE  Final   Culture   Final    >=100,000 COLONIES/mL PROTEUS MIRABILIS Performed at [facility]    Report Status 09/04/2014 FINAL  Final   Organism ID, Bacteria PROTEUS MIRABILIS  Final      Susceptibility   Proteus mirabilis - MIC*    AMPICILLIN <=2 SENSITIVE Sensitive     CEFAZOLIN 8 SENSITIVE Sensitive     CEFTRIAXONE <=1 SENSITIVE Sensitive     CIPROFLOXACIN 1 SENSITIVE Sensitive     GENTAMICIN <=1 SENSITIVE Sensitive     IMIPENEM 4 SENSITIVE Sensitive     NITROFURANTOIN 128 RESISTANT Resistant     TRIMETH/SULFA <=20 SENSITIVE Sensitive     AMPICILLIN/SULBACTAM <=2 SENSITIVE Sensitive     PIP/TAZO <=4 SENSITIVE Sensitive     * >=100,000 COLONIES/mL PROTEUS MIRABILIS  MRSA PCR Screening     Status: None   Collection Time: 09/02/14  4:39 AM  Result Value Ref Range Status   MRSA by PCR NEGATIVE NEGATIVE Final    Comment:        The GeneXpert MRSA Assay (FDA approved for NASAL specimens only), is one component of a comprehensive MRSA colonization surveillance program. It is not intended to diagnose MRSA infection nor to guide or monitor treatment for MRSA infections.   Culture, blood (routine x 2)     Status: None (Preliminary result)   Collection Time: 09/03/14 12:10 AM  Result Value Ref Range Status   Specimen Description BLOOD RIGHT HAND  Final   Special Requests BOTTLES DRAWN AEROBIC  ONLY 2CC  Final   Culture   Final    NO GROWTH 1 DAY Performed at [facility]    Report Status PENDING  Incomplete  Culture, blood (routine x 2)     Status: None (Preliminary result)   Collection Time: 09/03/14  2:30 AM  Result Value Ref Range Status   Specimen Description BLOOD RIGHT HAND  Final   Special Requests BOTTLES DRAWN AEROBIC ONLY 5CC  Final   Culture   Final    NO GROWTH 1 DAY Performed at [facility]    Report Status PENDING  Incomplete    Radiology Reports Dg Chest 2 View  09/03/2014   CLINICAL DATA:  Acute onset of fever.  Initial encounter.  EXAM: CHEST  2 VIEW  COMPARISON:  Chest radiograph performed 09/02/2014  FINDINGS: Vaguely increased density at the left lung could reflect mild infection. No pleural effusion or pneumothorax is seen.  The cardiomediastinal silhouette is normal in size. A pacemaker is noted at the left chest wall, with leads ending at the right atrium and right ventricle. No acute osseous abnormalities are identified.  IMPRESSION: Vaguely increased density at the left lung could reflect mild infection. Would correlate for associated symptoms.   Electronically Signed   By: Garald Balding M.D.   On: 09/03/2014 01:15   Dg Chest Port 1 View  09/05/2014   CLINICAL DATA:  Fever. No chest pain or cough. History of pneumonia and asthma. Current smoker.  EXAM: PORTABLE CHEST - 1 VIEW  COMPARISON:  09/03/2014 and 09/02/2014  FINDINGS: Left-sided pacemaker unchanged. Lungs are adequately inflated without focal consolidation or effusion. Cardiomediastinal silhouette and remainder of the exam is unchanged.  IMPRESSION: No acute cardiopulmonary disease.   Electronically Signed   By: Marin Olp M.D.  On: 09/05/2014 09:38   Dg Chest Port 1 View  09/02/2014   CLINICAL DATA:  Unresponsive.  Altered mental status.  EXAM: PORTABLE CHEST - 1 VIEW  COMPARISON:  One-view chest x-ray 08/01/2014.  FINDINGS: Heart size is normal. Mild interstitial edema is  present. There is no effusion. Pacing wires are stable in position. The visualized soft tissues and bony thorax are unremarkable.  IMPRESSION: 1. Mild interstitial edema without focal airspace disease.   Electronically Signed   By: San Morelle M.D.   On: 09/02/2014 01:56    CBC  Recent Labs Lab 09/02/14 0143 09/02/14 0158 09/03/14 0010 09/04/14 0506 09/05/14 0536  WBC 12.7*  --  6.7 15.3* 12.5*  HGB 10.3* 10.5* 8.9* 8.2* 8.0*  HCT 32.9* 31.0* 29.4* 25.6* 25.7*  PLT 439*  --  311 283 245  MCV 88.4  --  89.1 87.7 88.0  MCH 27.7  --  27.0 28.1 27.4  MCHC 31.3  --  30.3 32.0 31.1  RDW 17.2*  --  16.7* 17.3* 17.2*  LYMPHSABS 1.4  --  0.3*  --   --   MONOABS 1.1*  --  0.2  --   --   EOSABS 0.2  --  0.0  --   --   BASOSABS 0.0  --  0.0  --   --     Chemistries   Recent Labs Lab 09/02/14 0143 09/02/14 0158 09/03/14 0010 09/03/14 0017 09/04/14 0506 09/05/14 0536  NA 139 138 134*  --  133* 138  K 3.5 3.6 3.0*  --  5.1 4.3  CL 106 104 107  --  108 108  CO2 25  --  17*  --  20* 23  GLUCOSE 112* 110* 119*  --  90 94  BUN 16 18 11   --  8 8  CREATININE 0.55 0.80 0.68  --  0.41* 0.41*  CALCIUM 8.2*  --  8.2*  --  8.3* 8.7*  MG  --   --   --  1.3* 1.7 2.0  AST 12*  --  16  --   --   --   ALT 8*  --  8*  --   --   --   ALKPHOS 109  --  97  --   --   --   BILITOT 0.2*  --  0.3  --   --   --    ------------------------------------------------------------------------------------------------------------------ estimated creatinine clearance is 63.4 mL/min (by C-G formula based on Cr of 0.41). ------------------------------------------------------------------------------------------------------------------ No results for input(s): HGBA1C in the last 72 hours. ------------------------------------------------------------------------------------------------------------------ No results for input(s): CHOL, HDL, LDLCALC, TRIG, CHOLHDL, LDLDIRECT in the last 72  hours. ------------------------------------------------------------------------------------------------------------------ No results for input(s): TSH, T4TOTAL, T3FREE, THYROIDAB in the last 72 hours.  Invalid input(s): FREET3 ------------------------------------------------------------------------------------------------------------------ No results for input(s): VITAMINB12, FOLATE, FERRITIN, TIBC, IRON, RETICCTPCT in the last 72 hours.  Coagulation profile  Recent Labs Lab 09/02/14 0143  INR 1.04    No results for input(s): DDIMER in the last 72 hours.  Cardiac Enzymes No results for input(s): CKMB, TROPONINI, MYOGLOBIN in the last 168 hours.  Invalid input(s): CK ------------------------------------------------------------------------------------------------------------------ Invalid input(s): POCBNP    Lisabeth Mian D.O. on 09/05/2014 at 12:17 PM  Between 7am to 7pm - Pager - [phone]  After 7pm go to [email or web address] - password [email or web address]  And look for the night coverage person covering for me after hours  [facility] Office  [phone]

## 2014-09-06 DIAGNOSIS — D509 Iron deficiency anemia, unspecified: Secondary | ICD-10-CM

## 2014-09-06 DIAGNOSIS — F32A Depression, unspecified: Secondary | ICD-10-CM | POA: Insufficient documentation

## 2014-09-06 DIAGNOSIS — F329 Major depressive disorder, single episode, unspecified: Secondary | ICD-10-CM

## 2014-09-06 DIAGNOSIS — N390 Urinary tract infection, site not specified: Secondary | ICD-10-CM

## 2014-09-06 DIAGNOSIS — E43 Unspecified severe protein-calorie malnutrition: Secondary | ICD-10-CM

## 2014-09-06 LAB — CBC
HEMATOCRIT: 25.6 % — AB (ref 36.0–46.0)
HEMOGLOBIN: 7.9 g/dL — AB (ref 12.0–15.0)
MCH: 26.9 pg (ref 26.0–34.0)
MCHC: 30.9 g/dL (ref 30.0–36.0)
MCV: 87.1 fL (ref 78.0–100.0)
Platelets: 280 10*3/uL (ref 150–400)
RBC: 2.94 MIL/uL — AB (ref 3.87–5.11)
RDW: 16.7 % — ABNORMAL HIGH (ref 11.5–15.5)
WBC: 7.7 10*3/uL (ref 4.0–10.5)

## 2014-09-06 LAB — BASIC METABOLIC PANEL
Anion gap: 6 (ref 5–15)
BUN: 8 mg/dL (ref 6–20)
CHLORIDE: 102 mmol/L (ref 101–111)
CO2: 28 mmol/L (ref 22–32)
Calcium: 8.4 mg/dL — ABNORMAL LOW (ref 8.9–10.3)
Creatinine, Ser: 0.49 mg/dL (ref 0.44–1.00)
GFR calc non Af Amer: >60 mL/min (ref >60)
Glucose, Bld: 97 mg/dL (ref 65–99)
POTASSIUM: 4.2 mmol/L (ref 3.5–5.1)
SODIUM: 136 mmol/L (ref 135–145)

## 2014-09-06 MED ORDER — ACETAMINOPHEN 325 MG PO TABS: 650.0000 mg | ORAL_TABLET | Freq: Four times a day (QID) | ORAL | Status: DC | PRN

## 2014-09-06 MED ORDER — ZOLPIDEM TARTRATE 5 MG PO TABS: 5.0000 mg | ORAL_TABLET | Freq: Every evening | ORAL | Status: DC | PRN

## 2014-09-06 MED ORDER — OXYCODONE HCL 15 MG PO TABS: 15.0000 mg | ORAL_TABLET | ORAL | Status: DC | PRN

## 2014-09-06 MED ORDER — ZOLPIDEM TARTRATE 5 MG PO TABS
5.0000 mg | ORAL_TABLET | Freq: Every evening | ORAL | Status: DC | PRN
  Administered 2014-09-06: 5 mg via ORAL
  Filled 2014-09-06: qty 1

## 2014-09-06 MED ORDER — ENSURE ENLIVE PO LIQD: 237.0000 mL | Freq: Two times a day (BID) | ORAL | Status: DC

## 2014-09-06 MED ORDER — CIPROFLOXACIN HCL 500 MG PO TABS: 500.0000 mg | ORAL_TABLET | Freq: Two times a day (BID) | ORAL | Status: DC

## 2014-09-06 NOTE — Progress Notes (Signed)
Pt for discharge to home.   CSW received notification that pt needing ambulance transport to home.   CSW met with pt at bedside and confirmed address.  CSW discussed with RN and RN request transport to be scheduled for 12 pm pick up. CSW notified pt and pt agreeable.   CSW arranged ambulance transport via [facility] for pt scheduled for 12 pm pick up.   No further social work needs identified at this time.  CSW signing off.   Dennys Guin, MSW, LCSW Clinical Social Work [phone] 

## 2014-09-06 NOTE — Discharge Summary (Signed)
Physician Discharge Summary  Angela Page HWE:[id number] DOB: 03/07/1970 DOA: 09/02/2014  PCP: Pt is going to urgent care for acute medical issues   Admit date: 09/02/2014 Discharge date: 09/06/2014  Recommendations for Outpatient Follow-up:  1.Continue Cipro for 6 days on discharge.  Discharge Diagnoses:  Principal Problem:   Overdose of opiate or related narcotic Active Problems:   UTI (lower urinary tract infection)   Blood poisoning   Pneumonia, organism unspecified   Protein-calorie malnutrition, severe    Discharge Condition: stable; pt insists on going home today  Diet recommendation: as tolerated   History of present illness:  44 y.o. female with multiple medical comorbidities including but not limited to quadriparesis, pacemaker placement, sacral decubitus.Patient presented to [facility] ED with hypotension and unresponsiveness after she "snorted a Roxicodone". She required total of 3mg  of narcan in the ED to get her responsive.Her hospital course was complicated with sepsis thought to be secondary to pneumonia or UTI.  Hospital Course:   Assessment & Plan   Sepsis possibly secondary to questionable pneumonia vs UTI - Pneumonia and UTI seem to be source of infection- Pt has had  chest x-ray 8/14 which demonstrated gaintly increased density in the left lung that could reflect mild infection. Dr. Megan Salon recommended ceftazidime. Repeat CXR 8/16 demonstrated no acute cardiopulmonary disease  - Urine culture >100K Proteus, pansensitive. Blood culture showed no growth to date - Vanco added subsequently since she spiked fever while on ceftazidime - She now insists on going home, no fever overnight - Will prescribe cipro for 6 days on discharge   Drug overdose / Acute encephalopathy - Due to drug abuse - Mental status at baseline   Normocytic Anemia / Iron deficiency anemia  - Continue iron supplementation  Quadriplegia - Stable, able to move upper extremities however  not able to use hands.  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 Apply NS moist gauze to wound bed. Cover with ABD pad and tape.Change daily.   Hematuria - UA showed large urine dipstick, red urine, large leukocytes - Likely secondary to trauma as Foley was pulled out - Resolved   Depression  - Continue Celexa, Cymbalta  Prolonged QT  _ replaced electrolytes   Hypokalemia/Hypomagnesemia - Resolved   Code Status: Full Family Communication: No family at bedside  Signed:  Leisa Lenz, MD  [facility] 09/06/2014, 9:50 AM  Pager #: [phone]  Time spent in minutes: more than 30 minutes   Discharge Exam: Filed Vitals:   09/06/14 0420  BP: 96/55  Pulse: 88  Temp: 99.4 F (37.4 C)  Resp: 18   Filed Vitals:   09/05/14 0520 09/05/14 1421 09/05/14 2057 09/06/14 0420  BP: 125/77 99/50 110/76 96/55  Pulse: 80 82 78 88  Temp: 98.1 F (36.7 C) 98.7 F (37.1 C) 98.9 F (37.2 C) 99.4 F (37.4 C)  TempSrc: Oral Oral Oral Oral  Resp: 18  18 18   Height:      Weight:      SpO2: 95% 96% 99% 97%    General: Pt is alert, follows commands appropriately, not in acute distress Cardiovascular: Regular rate and rhythm, S1/S2 +, no murmurs Respiratory: Clear to auscultation bilaterally, no wheezing, no crackles, no rhonchi Abdominal: Soft, non tender, non distended,  bowel sounds +, no guarding Extremities: no edema, no cyanosis, pulses palpable bilaterally DP and PT Neuro: Grossly nonfocal  Discharge Instructions  Discharge Instructions    Call MD for:  difficulty breathing, headache or visual disturbances    Complete by:  As directed      Call MD for:  persistant nausea and vomiting    Complete by:  As directed      Call MD for:  severe  uncontrolled pain    Complete by:  As directed      Diet - low sodium heart healthy    Complete by:  As directed      Discharge instructions    Complete by:  As directed   1.Continue Cipro for 6 days on discharge.     Increase activity slowly    Complete by:  As directed             Medication List    STOP taking these medications        doxycycline 100 MG tablet  Commonly known as:  VIBRA-TABS     fluconazole 200 MG tablet  Commonly known as:  DIFLUCAN     nystatin 100000 UNIT/GM Powd     sodium hypochlorite external solution  Commonly known as:  DAKIN'S 1/2 STRENGTH      TAKE these medications        acetaminophen 325 MG tablet  Commonly known as:  TYLENOL  Take 2 tablets (650 mg total) by mouth every 6 (six) hours as needed for fever.     albuterol 108 (90 BASE) MCG/ACT inhaler  Commonly known as:  PROVENTIL HFA;VENTOLIN HFA  Inhale 2 puffs into the lungs every 6 (six) hours as needed. For shortness of breath     aspirin 325 MG EC tablet  Take 650 mg by mouth every 4 (four) hours as needed. For pain     baclofen 20 MG tablet  Commonly known as:  LIORESAL  Take 20 mg by mouth 3 (three) times daily.     ciprofloxacin 500 MG tablet  Commonly known as:  CIPRO  Take 1 tablet (500 mg total) by mouth 2 (two) times daily.     citalopram 20 MG tablet  Commonly known as:  CELEXA  Take 20 mg by mouth daily.     collagenase ointment  Commonly known as:  SANTYL  Apply 1 application topically daily.     DULoxetine 20 MG capsule  Commonly known as:  CYMBALTA  Take 20 mg by mouth 2 (two) times daily.     feeding supplement (ENSURE ENLIVE) Liqd  Take 237 mLs by mouth 2 (two) times daily between meals.     ferrous sulfate 325 (65 FE) MG tablet  Take 1 tablet (325 mg total) by mouth 3 (three) times daily.     gabapentin 300 MG capsule  Commonly known as:  NEURONTIN  Take 300 mg by mouth 3 (three) times daily.     omeprazole 20 MG capsule  Commonly known as:   PRILOSEC  Take 1 capsule (20 mg total) by mouth daily.     oxyCODONE 15 MG immediate release tablet  Commonly known as:  ROXICODONE  Take 1 tablet (15 mg total) by mouth every 4 (four) hours as needed. for pain     zinc oxide 11.3 % Crea cream  Commonly known as:  BALMEX  Apply 1 application topically 3 (three) times daily as needed (for skin redness/irritation).     zolpidem 5 MG  tablet  Commonly known as:  AMBIEN  Take 1 tablet (5 mg total) by mouth at bedtime as needed for sleep.          The results of significant diagnostics from this hospitalization (including imaging, microbiology, ancillary and laboratory) are listed below for reference.    Significant Diagnostic Studies: Dg Chest 2 View  09/03/2014   CLINICAL DATA:  Acute onset of fever.  Initial encounter.  EXAM: CHEST  2 VIEW  COMPARISON:  Chest radiograph performed 09/02/2014  FINDINGS: Vaguely increased density at the left lung could reflect mild infection. No pleural effusion or pneumothorax is seen.  The cardiomediastinal silhouette is normal in size. A pacemaker is noted at the left chest wall, with leads ending at the right atrium and right ventricle. No acute osseous abnormalities are identified.  IMPRESSION: Vaguely increased density at the left lung could reflect mild infection. Would correlate for associated symptoms.   Electronically Signed   By: Garald Balding M.D.   On: 09/03/2014 01:15   Dg Chest Port 1 View  09/05/2014   CLINICAL DATA:  Fever. No chest pain or cough. History of pneumonia and asthma. Current smoker.  EXAM: PORTABLE CHEST - 1 VIEW  COMPARISON:  09/03/2014 and 09/02/2014  FINDINGS: Left-sided pacemaker unchanged. Lungs are adequately inflated without focal consolidation or effusion. Cardiomediastinal silhouette and remainder of the exam is unchanged.  IMPRESSION: No acute cardiopulmonary disease.   Electronically Signed   By: Marin Olp M.D.   On: 09/05/2014 09:38   Dg Chest Port 1  View  09/02/2014   CLINICAL DATA:  Unresponsive.  Altered mental status.  EXAM: PORTABLE CHEST - 1 VIEW  COMPARISON:  One-view chest x-ray 08/01/2014.  FINDINGS: Heart size is normal. Mild interstitial edema is present. There is no effusion. Pacing wires are stable in position. The visualized soft tissues and bony thorax are unremarkable.  IMPRESSION: 1. Mild interstitial edema without focal airspace disease.   Electronically Signed   By: San Morelle M.D.   On: 09/02/2014 01:56    Microbiology: Recent Results (from the past 240 hour(s))  Urine culture     Status: None   Collection Time: 09/02/14  1:14 AM  Result Value Ref Range Status   Specimen Description URINE, CATHETERIZED  Final   Special Requests NONE  Final   Culture   Final    >=100,000 COLONIES/mL PROTEUS MIRABILIS Performed at [facility]    Report Status 09/04/2014 FINAL  Final   Organism ID, Bacteria PROTEUS MIRABILIS  Final      Susceptibility   Proteus mirabilis - MIC*    AMPICILLIN <=2 SENSITIVE Sensitive     CEFAZOLIN 8 SENSITIVE Sensitive     CEFTRIAXONE <=1 SENSITIVE Sensitive     CIPROFLOXACIN 1 SENSITIVE Sensitive     GENTAMICIN <=1 SENSITIVE Sensitive     IMIPENEM 4 SENSITIVE Sensitive     NITROFURANTOIN 128 RESISTANT Resistant     TRIMETH/SULFA <=20 SENSITIVE Sensitive     AMPICILLIN/SULBACTAM <=2 SENSITIVE Sensitive     PIP/TAZO <=4 SENSITIVE Sensitive     * >=100,000 COLONIES/mL PROTEUS MIRABILIS  MRSA PCR Screening     Status: None   Collection Time: 09/02/14  4:39 AM  Result Value Ref Range Status   MRSA by PCR NEGATIVE NEGATIVE Final    Comment:        The GeneXpert MRSA Assay (FDA approved for NASAL specimens only), is one component of a comprehensive MRSA colonization surveillance program. It is not  intended to diagnose MRSA infection nor to guide or monitor treatment for MRSA infections.   Culture, blood (routine x 2)     Status: None (Preliminary result)   Collection Time:  09/03/14 12:10 AM  Result Value Ref Range Status   Specimen Description BLOOD RIGHT HAND  Final   Special Requests BOTTLES DRAWN AEROBIC ONLY [place]  Final   Culture   Final    NO GROWTH 2 DAYS Performed at [facility]    Report Status PENDING  Incomplete  Culture, blood (routine x 2)     Status: None (Preliminary result)   Collection Time: 09/03/14  2:30 AM  Result Value Ref Range Status   Specimen Description BLOOD RIGHT HAND  Final   Special Requests BOTTLES DRAWN AEROBIC ONLY 5CC  Final   Culture   Final    NO GROWTH 2 DAYS Performed at [facility]    Report Status PENDING  Incomplete     Labs: Basic Metabolic Panel:  Recent Labs Lab 09/02/14 0143 09/02/14 0158 09/03/14 0010 09/03/14 0017 09/04/14 0506 09/05/14 0536 09/06/14 0454  NA 139 138 134*  --  133* 138 136  K 3.5 3.6 3.0*  --  5.1 4.3 4.2  CL 106 104 107  --  108 108 102  CO2 25  --  17*  --  20* 23 28  GLUCOSE 112* 110* 119*  --  90 94 97  BUN 16 18 11   --  8 8 8   CREATININE 0.55 0.80 0.68  --  0.41* 0.41* 0.49  CALCIUM 8.2*  --  8.2*  --  8.3* 8.7* 8.4*  MG  --   --   --  1.3* 1.7 2.0  --    Liver Function Tests:  Recent Labs Lab 09/02/14 0143 09/03/14 0010  AST 12* 16  ALT 8* 8*  ALKPHOS 109 97  BILITOT 0.2* 0.3  PROT 5.7* 5.1*  ALBUMIN 1.9* 1.8*    Recent Labs Lab 09/02/14 0143  LIPASE 56*   No results for input(s): AMMONIA in the last 168 hours. CBC:  Recent Labs Lab 09/02/14 0143 09/02/14 0158 09/03/14 0010 09/04/14 0506 09/05/14 0536 09/06/14 0454  WBC 12.7*  --  6.7 15.3* 12.5* 7.7  NEUTROABS 10.0*  --  6.2  --   --   --   HGB 10.3* 10.5* 8.9* 8.2* 8.0* 7.9*  HCT 32.9* 31.0* 29.4* 25.6* 25.7* 25.6*  MCV 88.4  --  89.1 87.7 88.0 87.1  PLT 439*  --  311 283 245 280   Cardiac Enzymes: No results for input(s): CKTOTAL, CKMB, CKMBINDEX, TROPONINI in the last 168 hours. BNP: BNP (last 3 results) No results for input(s): BNP in the last 8760 hours.  ProBNP  (last 3 results) No results for input(s): PROBNP in the last 8760 hours.  CBG:  Recent Labs Lab 09/02/14 0047  GLUCAP 100*

## 2014-09-06 NOTE — Plan of Care (Signed)
Problem: Phase I Progression Outcomes Goal: OOB as tolerated unless otherwise ordered Outcome: Not Met (add Reason) Pt is paraplegic.      

## 2014-09-06 NOTE — Discharge Instructions (Signed)
Urinary Tract Infection Urinary tract infections (UTIs) can develop anywhere along your urinary tract. Your urinary tract is your body's drainage system for removing wastes and extra water. Your urinary tract includes two kidneys, two ureters, a bladder, and a urethra. Your kidneys are a pair of bean-shaped organs. Each kidney is about the size of your fist. They are located below your ribs, one on each side of your spine. CAUSES Infections are caused by microbes, which are microscopic organisms, including fungi, viruses, and bacteria. These organisms are so small that they can only be seen through a microscope. Bacteria are the microbes that most commonly cause UTIs. SYMPTOMS  Symptoms of UTIs may vary by age and gender of the patient and by the location of the infection. Symptoms in young women typically include a frequent and intense urge to urinate and a painful, burning feeling in the bladder or urethra during urination. Older women and men are more likely to be tired, shaky, and weak and have muscle aches and abdominal pain. A fever may mean the infection is in your kidneys. Other symptoms of a kidney infection include pain in your back or sides below the ribs, nausea, and vomiting. DIAGNOSIS To diagnose a UTI, your caregiver will ask you about your symptoms. Your caregiver also will ask to provide a urine sample. The urine sample will be tested for bacteria and white blood cells. White blood cells are made by your body to help fight infection. TREATMENT  Typically, UTIs can be treated with medication. Because most UTIs are caused by a bacterial infection, they usually can be treated with the use of antibiotics. The choice of antibiotic and length of treatment depend on your symptoms and the type of bacteria causing your infection. HOME CARE INSTRUCTIONS  If you were prescribed antibiotics, take them exactly as your caregiver instructs you. Finish the medication even if you feel better after you  have only taken some of the medication.  Drink enough water and fluids to keep your urine clear or pale yellow.  Avoid caffeine, tea, and carbonated beverages. They tend to irritate your bladder.  Empty your bladder often. Avoid holding urine for long periods of time.  Empty your bladder before and after sexual intercourse.  After a bowel movement, women should cleanse from front to back. Use each tissue only once. SEEK MEDICAL CARE IF:   You have back pain.  You develop a fever.  Your symptoms do not begin to resolve within 3 days. SEEK IMMEDIATE MEDICAL CARE IF:   You have severe back pain or lower abdominal pain.  You develop chills.  You have nausea or vomiting.  You have continued burning or discomfort with urination. MAKE SURE YOU:   Understand these instructions.  Will watch your condition.  Will get help right away if you are not doing well or get worse. Document Released: 10/16/2004 Document Revised: 07/08/2011 Document Reviewed: 02/14/2011 [facility] Patient Information 2015 [place], [place]. This information is not intended to replace advice given to you by your health care provider. Make sure you discuss any questions you have with your health care provider. Ciprofloxacin tablets What is this medicine? CIPROFLOXACIN (sip roe FLOX a sin) is a quinolone antibiotic. It is used to treat certain kinds of bacterial infections. It will not work for colds, flu, or other viral infections. This medicine may be used for other purposes; ask your health care provider or pharmacist if you have questions. COMMON BRAND NAME(S): Cipro What should I tell my health care  provider before I take this medicine? They need to know if you have any of these conditions: -bone problems -cerebral disease -joint problems -irregular heartbeat -kidney disease -liver disease -myasthenia gravis -seizure disorder -tendon problems -an unusual or allergic reaction to ciprofloxacin, other  antibiotics or medicines, foods, dyes, or preservatives -pregnant or trying to get pregnant -breast-feeding How should I use this medicine? Take this medicine by mouth with a glass of water. Follow the directions on the prescription label. Take your medicine at regular intervals. Do not take your medicine more often than directed. Take all of your medicine as directed even if you think your are better. Do not skip doses or stop your medicine early. You can take this medicine with food or on an empty stomach. It can be taken with a meal that contains dairy or calcium, but do not take it alone with a dairy product, like milk or yogurt or calcium-fortified juice. A special MedGuide will be given to you by the pharmacist with each prescription and refill. Be sure to read this information carefully each time. Talk to your pediatrician regarding the use of this medicine in children. Special care may be needed. Overdosage: If you think you have taken too much of this medicine contact a poison control center or emergency room at once. NOTE: This medicine is only for you. Do not share this medicine with others. What if I miss a dose? If you miss a dose, take it as soon as you can. If it is almost time for your next dose, take only that dose. Do not take double or extra doses. What may interact with this medicine? Do not take this medicine with any of the following medications: -cisapride -droperidol -terfenadine -tizanidine This medicine may also interact with the following medications: -antacids -birth control pills -caffeine -cyclosporin -didanosine (ddI) buffered tablets or powder -medicines for diabetes -medicines for inflammation like ibuprofen, naproxen -methotrexate -multivitamins -omeprazole -phenytoin -probenecid -sucralfate -theophylline -warfarin This list may not describe all possible interactions. Give your health care provider a list of all the medicines, herbs,  non-prescription drugs, or dietary supplements you use. Also tell them if you smoke, drink alcohol, or use illegal drugs. Some items may interact with your medicine. What should I watch for while using this medicine? Tell your doctor or health care professional if your symptoms do not improve. Do not treat diarrhea with over the counter products. Contact your doctor if you have diarrhea that lasts more than 2 days or if it is severe and watery. You may get drowsy or dizzy. Do not drive, use machinery, or do anything that needs mental alertness until you know how this medicine affects you. Do not stand or sit up quickly, especially if you are an older patient. This reduces the risk of dizzy or fainting spells. This medicine can make you more sensitive to the sun. Keep out of the sun. If you cannot avoid being in the sun, wear protective clothing and use sunscreen. Do not use sun lamps or tanning beds/booths. Avoid antacids, aluminum, calcium, iron, magnesium, and zinc products for 6 hours before and 2 hours after taking a dose of this medicine. What side effects may I notice from receiving this medicine? Side effects that you should report to your doctor or health care professional as soon as possible: - allergic reactions like skin rash, itching or hives, swelling of the face, lips, or tongue - breathing problems - confusion, nightmares or hallucinations - feeling faint or lightheaded, falls -  irregular heartbeat - joint, muscle or tendon pain or swelling - pain or trouble passing urine -persistent headache with or without blurred vision - redness, blistering, peeling or loosening of the skin, including inside the mouth - seizure - unusual pain, numbness, tingling, or weakness Side effects that usually do not require medical attention (report to your doctor or health care professional if they continue or are bothersome): - diarrhea - nausea or stomach upset - white patches or  sores in the mouth This list may not describe all possible side effects. Call your doctor for medical advice about side effects. You may report side effects to FDA at 1-800-FDA-1088. Where should I keep my medicine? Keep out of the reach of children. Store at room temperature below 30 degrees C (86 degrees F). Keep container tightly closed. Throw away any unused medicine after the expiration date. NOTE: This sheet is a summary. It may not cover all possible information. If you have questions about this medicine, talk to your doctor, pharmacist, or health care provider.  2015, [employer]. (2012-08-12 16:10:46)

## 2014-09-08 LAB — CULTURE, BLOOD (ROUTINE X 2)
CULTURE: NO GROWTH
CULTURE: NO GROWTH

## 2014-10-22 ENCOUNTER — Emergency Department (HOSPITAL_COMMUNITY): Payer: Medicaid Other

## 2014-10-22 ENCOUNTER — Encounter (HOSPITAL_COMMUNITY): Payer: Self-pay | Admitting: Emergency Medicine

## 2014-10-22 ENCOUNTER — Inpatient Hospital Stay (HOSPITAL_COMMUNITY)
Admission: EM | Admit: 2014-10-22 | Discharge: 2014-10-26 | DRG: 871 | Disposition: A | Discharge status: Home Health | Payer: Medicaid Other | Attending: Internal Medicine | Admitting: Internal Medicine

## 2014-10-22 DIAGNOSIS — T40601A Poisoning by unspecified narcotics, accidental (unintentional), initial encounter: Secondary | ICD-10-CM | POA: Diagnosis present

## 2014-10-22 DIAGNOSIS — W1789XS Other fall from one level to another, sequela: Secondary | ICD-10-CM

## 2014-10-22 DIAGNOSIS — R6520 Severe sepsis without septic shock: Secondary | ICD-10-CM | POA: Diagnosis present

## 2014-10-22 DIAGNOSIS — L89154 Pressure ulcer of sacral region, stage 4: Secondary | ICD-10-CM | POA: Diagnosis present

## 2014-10-22 DIAGNOSIS — Z681 Body mass index (BMI) 19 or less, adult: Secondary | ICD-10-CM | POA: Diagnosis not present

## 2014-10-22 DIAGNOSIS — E876 Hypokalemia: Secondary | ICD-10-CM | POA: Diagnosis present

## 2014-10-22 DIAGNOSIS — F329 Major depressive disorder, single episode, unspecified: Secondary | ICD-10-CM | POA: Diagnosis present

## 2014-10-22 DIAGNOSIS — D72829 Elevated white blood cell count, unspecified: Secondary | ICD-10-CM | POA: Diagnosis present

## 2014-10-22 DIAGNOSIS — F1110 Opioid abuse, uncomplicated: Secondary | ICD-10-CM | POA: Diagnosis present

## 2014-10-22 DIAGNOSIS — G8254 Quadriplegia, C5-C7 incomplete: Secondary | ICD-10-CM | POA: Diagnosis present

## 2014-10-22 DIAGNOSIS — L89314 Pressure ulcer of right buttock, stage 4: Secondary | ICD-10-CM | POA: Diagnosis present

## 2014-10-22 DIAGNOSIS — E43 Unspecified severe protein-calorie malnutrition: Secondary | ICD-10-CM | POA: Diagnosis present

## 2014-10-22 DIAGNOSIS — Z881 Allergy status to other antibiotic agents status: Secondary | ICD-10-CM

## 2014-10-22 DIAGNOSIS — E872 Acidosis: Secondary | ICD-10-CM | POA: Diagnosis present

## 2014-10-22 DIAGNOSIS — F32A Depression, unspecified: Secondary | ICD-10-CM | POA: Diagnosis present

## 2014-10-22 DIAGNOSIS — Z791 Long term (current) use of non-steroidal anti-inflammatories (NSAID): Secondary | ICD-10-CM | POA: Diagnosis not present

## 2014-10-22 DIAGNOSIS — D509 Iron deficiency anemia, unspecified: Secondary | ICD-10-CM | POA: Diagnosis present

## 2014-10-22 DIAGNOSIS — J45909 Unspecified asthma, uncomplicated: Secondary | ICD-10-CM | POA: Diagnosis present

## 2014-10-22 DIAGNOSIS — A4159 Other Gram-negative sepsis: Principal | ICD-10-CM | POA: Diagnosis present

## 2014-10-22 DIAGNOSIS — R64 Cachexia: Secondary | ICD-10-CM | POA: Diagnosis present

## 2014-10-22 DIAGNOSIS — F17210 Nicotine dependence, cigarettes, uncomplicated: Secondary | ICD-10-CM | POA: Diagnosis present

## 2014-10-22 DIAGNOSIS — S14155S Other incomplete lesion at C5 level of cervical spinal cord, sequela: Secondary | ICD-10-CM

## 2014-10-22 DIAGNOSIS — Z880 Allergy status to penicillin: Secondary | ICD-10-CM

## 2014-10-22 DIAGNOSIS — G92 Toxic encephalopathy: Secondary | ICD-10-CM | POA: Diagnosis present

## 2014-10-22 DIAGNOSIS — Z79899 Other long term (current) drug therapy: Secondary | ICD-10-CM | POA: Diagnosis not present

## 2014-10-22 DIAGNOSIS — Z79891 Long term (current) use of opiate analgesic: Secondary | ICD-10-CM | POA: Diagnosis not present

## 2014-10-22 DIAGNOSIS — R0902 Hypoxemia: Secondary | ICD-10-CM | POA: Diagnosis present

## 2014-10-22 DIAGNOSIS — A419 Sepsis, unspecified organism: Secondary | ICD-10-CM | POA: Diagnosis not present

## 2014-10-22 DIAGNOSIS — B964 Proteus (mirabilis) (morganii) as the cause of diseases classified elsewhere: Secondary | ICD-10-CM | POA: Diagnosis present

## 2014-10-22 DIAGNOSIS — I959 Hypotension, unspecified: Secondary | ICD-10-CM | POA: Diagnosis present

## 2014-10-22 DIAGNOSIS — I9589 Other hypotension: Secondary | ICD-10-CM

## 2014-10-22 DIAGNOSIS — G8929 Other chronic pain: Secondary | ICD-10-CM | POA: Diagnosis present

## 2014-10-22 DIAGNOSIS — K219 Gastro-esophageal reflux disease without esophagitis: Secondary | ICD-10-CM | POA: Diagnosis present

## 2014-10-22 DIAGNOSIS — G9340 Encephalopathy, unspecified: Secondary | ICD-10-CM | POA: Diagnosis not present

## 2014-10-22 DIAGNOSIS — G9341 Metabolic encephalopathy: Secondary | ICD-10-CM | POA: Diagnosis present

## 2014-10-22 DIAGNOSIS — R6521 Severe sepsis with septic shock: Secondary | ICD-10-CM | POA: Diagnosis present

## 2014-10-22 DIAGNOSIS — N390 Urinary tract infection, site not specified: Secondary | ICD-10-CM | POA: Diagnosis present

## 2014-10-22 DIAGNOSIS — Z8249 Family history of ischemic heart disease and other diseases of the circulatory system: Secondary | ICD-10-CM | POA: Diagnosis not present

## 2014-10-22 DIAGNOSIS — R531 Weakness: Secondary | ICD-10-CM | POA: Diagnosis present

## 2014-10-22 DIAGNOSIS — G8250 Quadriplegia, unspecified: Secondary | ICD-10-CM | POA: Diagnosis present

## 2014-10-22 DIAGNOSIS — L89894 Pressure ulcer of other site, stage 4: Secondary | ICD-10-CM | POA: Diagnosis present

## 2014-10-22 LAB — CBC WITH DIFFERENTIAL/PLATELET
Basophils Absolute: 0 10*3/uL (ref 0.0–0.1)
Basophils Relative: 0 %
Eosinophils Absolute: 0.3 10*3/uL (ref 0.0–0.7)
Eosinophils Relative: 2 %
HCT: 34.5 % — ABNORMAL LOW (ref 36.0–46.0)
Hemoglobin: 10.8 g/dL — ABNORMAL LOW (ref 12.0–15.0)
Lymphocytes Relative: 10 %
Lymphs Abs: 1.3 10*3/uL (ref 0.7–4.0)
MCH: 27.9 pg (ref 26.0–34.0)
MCHC: 31.3 g/dL (ref 30.0–36.0)
MCV: 89.1 fL (ref 78.0–100.0)
Monocytes Absolute: 0.7 10*3/uL (ref 0.1–1.0)
Monocytes Relative: 5 %
Neutro Abs: 10.3 10*3/uL — ABNORMAL HIGH (ref 1.7–7.7)
Neutrophils Relative %: 83 %
Platelets: 388 10*3/uL (ref 150–400)
RBC: 3.87 MIL/uL (ref 3.87–5.11)
RDW: 15.3 % (ref 11.5–15.5)
WBC: 12.5 10*3/uL — ABNORMAL HIGH (ref 4.0–10.5)

## 2014-10-22 LAB — URINE MICROSCOPIC-ADD ON

## 2014-10-22 LAB — APTT: APTT: 31 s (ref 24–37)

## 2014-10-22 LAB — URINALYSIS, ROUTINE W REFLEX MICROSCOPIC
Bilirubin Urine: NEGATIVE
Glucose, UA: NEGATIVE mg/dL
Ketones, ur: NEGATIVE mg/dL
Nitrite: NEGATIVE
Protein, ur: 100 mg/dL — AB
Specific Gravity, Urine: 1.013 (ref 1.005–1.030)
Urobilinogen, UA: 0.2 mg/dL (ref 0.0–1.0)
pH: 8 (ref 5.0–8.0)

## 2014-10-22 LAB — COMPREHENSIVE METABOLIC PANEL
ALT: 10 U/L — ABNORMAL LOW (ref 14–54)
AST: 18 U/L (ref 15–41)
Albumin: 2 g/dL — ABNORMAL LOW (ref 3.5–5.0)
Alkaline Phosphatase: 92 U/L (ref 38–126)
Anion gap: 5 (ref 5–15)
BUN: 16 mg/dL (ref 6–20)
CO2: 24 mmol/L (ref 22–32)
Calcium: 8.5 mg/dL — ABNORMAL LOW (ref 8.9–10.3)
Chloride: 109 mmol/L (ref 101–111)
Creatinine, Ser: 0.77 mg/dL (ref 0.44–1.00)
GFR calc Af Amer: >60 mL/min (ref >60)
GFR calc non Af Amer: >60 mL/min (ref >60)
Glucose, Bld: 96 mg/dL (ref 65–99)
Potassium: 3.9 mmol/L (ref 3.5–5.1)
Sodium: 138 mmol/L (ref 135–145)
Total Bilirubin: 0.5 mg/dL (ref 0.3–1.2)
Total Protein: 5.6 g/dL — ABNORMAL LOW (ref 6.5–8.1)

## 2014-10-22 LAB — TROPONIN I: Troponin I: <0.03 ng/mL (ref <0.031)

## 2014-10-22 LAB — I-STAT CG4 LACTIC ACID, ED
Lactic Acid, Venous: 2.3 mmol/L (ref 0.5–2.0)
Lactic Acid, Venous: 0.98 mmol/L (ref 0.5–2.0)

## 2014-10-22 LAB — PROTIME-INR
INR: 1.23 (ref 0.00–1.49)
Prothrombin Time: 15.6 seconds — ABNORMAL HIGH (ref 11.6–15.2)

## 2014-10-22 LAB — LACTIC ACID, PLASMA: Lactic Acid, Venous: 2.5 mmol/L (ref 0.5–2.0)

## 2014-10-22 MED ORDER — ALUM & MAG HYDROXIDE-SIMETH 200-200-20 MG/5ML PO SUSP: 30.0000 mL | Freq: Four times a day (QID) | ORAL | Status: DC | PRN

## 2014-10-22 MED ORDER — SODIUM CHLORIDE 0.9 % IV SOLN: INTRAVENOUS | Status: DC

## 2014-10-22 MED ORDER — LEVOFLOXACIN IN D5W 750 MG/150ML IV SOLN
750.0000 mg | INTRAVENOUS | Status: AC
  Administered 2014-10-22: 750 mg via INTRAVENOUS
  Filled 2014-10-22: qty 150

## 2014-10-22 MED ORDER — ONDANSETRON HCL 4 MG/2ML IJ SOLN: 4.0000 mg | Freq: Four times a day (QID) | INTRAMUSCULAR | Status: DC | PRN

## 2014-10-22 MED ORDER — SODIUM CHLORIDE 0.9 % IV SOLN
INTRAVENOUS | Status: DC
  Administered 2014-10-22 – 2014-10-26 (×7): via INTRAVENOUS

## 2014-10-22 MED ORDER — SODIUM CHLORIDE 0.9 % IV BOLUS (SEPSIS)
1000.0000 mL | Freq: Once | INTRAVENOUS | Status: AC
  Administered 2014-10-22: 1000 mL via INTRAVENOUS

## 2014-10-22 MED ORDER — SODIUM CHLORIDE 0.9 % IV BOLUS (SEPSIS)
500.0000 mL | INTRAVENOUS | Status: AC
  Administered 2014-10-22: 500 mL via INTRAVENOUS

## 2014-10-22 MED ORDER — ONDANSETRON HCL 4 MG PO TABS
4.0000 mg | ORAL_TABLET | Freq: Four times a day (QID) | ORAL | Status: DC | PRN
Start: 2014-10-22 — End: 2014-10-26

## 2014-10-22 MED ORDER — ENOXAPARIN SODIUM 40 MG/0.4ML ~~LOC~~ SOLN
40.0000 mg | Freq: Every day | SUBCUTANEOUS | Status: DC
  Administered 2014-10-23: 40 mg via SUBCUTANEOUS
  Filled 2014-10-22: qty 0.4

## 2014-10-22 MED ORDER — DEXTROSE 5 % IV SOLN
2.0000 g | INTRAVENOUS | Status: AC
  Administered 2014-10-22: 2 g via INTRAVENOUS
  Filled 2014-10-22: qty 2

## 2014-10-22 MED ORDER — NALOXONE HCL 0.4 MG/ML IJ SOLN: 0.4000 mg | INTRAMUSCULAR | Status: DC | PRN

## 2014-10-22 MED ORDER — DEXTROSE 5 % IV SOLN
0.2500 mg/h | INTRAVENOUS | Status: DC
  Administered 2014-10-22: 0.25 mg/h via INTRAVENOUS
  Filled 2014-10-22: qty 4

## 2014-10-22 MED ORDER — BACLOFEN 20 MG PO TABS
30.0000 mg | ORAL_TABLET | Freq: Every day | ORAL | Status: DC
  Administered 2014-10-23 – 2014-10-25 (×4): 30 mg via ORAL
  Filled 2014-10-22: qty 3
  Filled 2014-10-22 (×3): qty 1

## 2014-10-22 MED ORDER — DEXTROSE 5 % IV SOLN
1.0000 g | Freq: Three times a day (TID) | INTRAVENOUS | Status: DC
  Administered 2014-10-23 – 2014-10-25 (×7): 1 g via INTRAVENOUS
  Filled 2014-10-22 (×9): qty 1

## 2014-10-22 MED ORDER — ALBUTEROL SULFATE (2.5 MG/3ML) 0.083% IN NEBU: 2.5000 mg | INHALATION_SOLUTION | Freq: Four times a day (QID) | RESPIRATORY_TRACT | Status: DC | PRN

## 2014-10-22 MED ORDER — ACETAMINOPHEN 650 MG RE SUPP
650.0000 mg | Freq: Once | RECTAL | Status: AC
  Administered 2014-10-22: 650 mg via RECTAL
  Filled 2014-10-22: qty 1

## 2014-10-22 MED ORDER — SODIUM CHLORIDE 0.9 % IV BOLUS (SEPSIS)
1000.0000 mL | Freq: Once | INTRAVENOUS | Status: AC
Start: 2014-10-22 — End: 2014-10-22
  Administered 2014-10-22: 1000 mL via INTRAVENOUS

## 2014-10-22 MED ORDER — VANCOMYCIN HCL IN DEXTROSE 750-5 MG/150ML-% IV SOLN
750.0000 mg | INTRAVENOUS | Status: DC
  Filled 2014-10-22: qty 150

## 2014-10-22 MED ORDER — ENSURE ENLIVE PO LIQD
237.0000 mL | Freq: Two times a day (BID) | ORAL | Status: DC
  Administered 2014-10-23: 237 mL via ORAL

## 2014-10-22 MED ORDER — DEXTROSE 5 % IV SOLN: 2.0000 g | Freq: Once | INTRAVENOUS | Status: DC

## 2014-10-22 MED ORDER — LEVOFLOXACIN IN D5W 750 MG/150ML IV SOLN
750.0000 mg | INTRAVENOUS | Status: DC
  Administered 2014-10-23 – 2014-10-24 (×2): 750 mg via INTRAVENOUS
  Filled 2014-10-22 (×2): qty 150

## 2014-10-22 MED ORDER — NALOXONE HCL 0.4 MG/ML IJ SOLN
0.4000 mg | Freq: Once | INTRAMUSCULAR | Status: AC
  Administered 2014-10-22: 0.4 mg via INTRAVENOUS
  Filled 2014-10-22: qty 1

## 2014-10-22 MED ORDER — SODIUM CHLORIDE 0.9 % IV BOLUS (SEPSIS)
500.0000 mL | INTRAVENOUS | Status: AC
Start: 2014-10-22 — End: 2014-10-22
  Administered 2014-10-22: 500 mL via INTRAVENOUS

## 2014-10-22 MED ORDER — VANCOMYCIN HCL IN DEXTROSE 1-5 GM/200ML-% IV SOLN
1000.0000 mg | INTRAVENOUS | Status: DC
  Administered 2014-10-22 – 2014-10-24 (×3): 1000 mg via INTRAVENOUS
  Filled 2014-10-22 (×3): qty 200

## 2014-10-22 MED ORDER — PHENYLEPHRINE HCL 10 MG/ML IJ SOLN
0.0000 ug/min | INTRAVENOUS | Status: DC
  Filled 2014-10-22: qty 1

## 2014-10-22 NOTE — ED Provider Notes (Signed)
CSN: [id number]     Arrival date & time 10/22/14  1610 History   First MD Initiated Contact with Patient 10/22/14 1638     Chief Complaint  Patient presents with  . Urinary Tract Infection     (Consider location/radiation/quality/duration/timing/severity/associated sxs/prior Treatment) HPI   44 y.o. female with a history of C-5 Quadriplegia after fall from deck in 2014. Coming to ED with complaints of increased lethargy and decreasing responsiveness with SOB and confusion after taking an increased dose of her pain medication. She reports that she was having increased pain that was unrelieved after 1 dose of the medicine and her mother gave her more medicine a total of 20 mg of Roxicodone. Chronic foley. Mother reports has had similar symptoms (drowsiness) with UTI. No fever.  Past Medical History  Diagnosis Date  . Asthma   . Pleurisy   . GERD (gastroesophageal reflux disease)   . Quadriparesis   . Pacemaker    Past Surgical History  Procedure Laterality Date  . Appendectomy    . Cardiac surgery    . I&d extremity  10/28/2011    Procedure: IRRIGATION AND DEBRIDEMENT EXTREMITY;  Surgeon: Tennis Must, MD;  Location: [facility];  Service: Orthopedics;  Laterality: Right;  Irrigation and debridement right middle finger   Family History  Problem Relation Age of Onset  . Hypertension Maternal Uncle    Social History  Substance Use Topics  . Smoking status: Current Every Day Smoker -- 1.00 packs/day for 25 years    Types: Cigarettes  . Smokeless tobacco: Not on file  . Alcohol Use: Yes     Comment: 2 x weekly   OB History    No data available     Review of Systems  Level 5 caveat because of decreased mental status.   Allergies  Erythromycin; Nitrofurantoin monohyd macro; and Penicillins  Home Medications   Prior to Admission medications   Medication Sig Start Date End Date Taking? Authorizing Provider  acetaminophen (TYLENOL) 325 MG tablet Take 2  tablets (650 mg total) by mouth every 6 (six) hours as needed for fever. 09/06/14   Robbie Lis, MD  albuterol (PROVENTIL HFA;VENTOLIN HFA) 108 (90 BASE) MCG/ACT inhaler Inhale 2 puffs into the lungs every 6 (six) hours as needed. For shortness of breath     Historical Provider, MD  aspirin 325 MG EC tablet Take 650 mg by mouth every 4 (four) hours as needed. For pain    Historical Provider, MD  baclofen (LIORESAL) 20 MG tablet Take 20 mg by mouth 3 (three) times daily. 07/04/14   Historical Provider, MD  ciprofloxacin (CIPRO) 500 MG tablet Take 1 tablet (500 mg total) by mouth 2 (two) times daily. 09/06/14   Robbie Lis, MD  citalopram (CELEXA) 20 MG tablet Take 20 mg by mouth daily. 07/04/14   Historical Provider, MD  collagenase (SANTYL) ointment Apply 1 application topically daily.    Historical Provider, MD  DULoxetine (CYMBALTA) 20 MG capsule Take 20 mg by mouth 2 (two) times daily. 07/04/14   Historical Provider, MD  feeding supplement, ENSURE ENLIVE, (ENSURE ENLIVE) LIQD Take 237 mLs by mouth 2 (two) times daily between meals. 09/06/14   Robbie Lis, MD  ferrous sulfate 325 (65 FE) MG tablet Take 1 tablet (325 mg total) by mouth 3 (three) times daily. 08/04/14   Janece Canterbury, MD  gabapentin (NEURONTIN) 300 MG capsule Take 300 mg by mouth 3 (three) times daily. 07/04/14   Historical  Provider, MD  omeprazole (PRILOSEC) 20 MG capsule Take 1 capsule (20 mg total) by mouth daily. 08/04/14   Janece Canterbury, MD  oxyCODONE (ROXICODONE) 15 MG immediate release tablet Take 1 tablet (15 mg total) by mouth every 4 (four) hours as needed. for pain 09/06/14   Robbie Lis, MD  zinc oxide (BALMEX) 11.3 % CREA cream Apply 1 application topically 3 (three) times daily as needed (for skin redness/irritation).    Historical Provider, MD  zolpidem (AMBIEN) 5 MG tablet Take 1 tablet (5 mg total) by mouth at bedtime as needed for sleep. 09/06/14   Robbie Lis, MD   BP 68/40 mmHg  Pulse 70  Temp(Src) 100.6 F  (38.1 C) (Rectal)  Resp 12  SpO2 94% Physical Exam  Constitutional:  Cachectic. Chronically ill appearing.   HENT:  Head: Normocephalic and atraumatic.  Eyes: Conjunctivae are normal. Right eye exhibits no discharge. Left eye exhibits no discharge.  Neck: Neck supple.  Cardiovascular: Normal rate, regular rhythm and normal heart sounds.  Exam reveals no gallop and no friction rub.   No murmur heard. Pulmonary/Chest: Effort normal and breath sounds normal. No respiratory distress.  Abdominal: Soft. She exhibits no distension. There is no tenderness.  Genitourinary:  foley  Musculoskeletal: She exhibits no edema or tenderness.  Neurological:  Very drowsy. Will open eyes to painful stimuli. Moves all extremities equally. Localizes pain.  Skin: Skin is warm and dry.  Nursing note and vitals reviewed.   ED Course  Procedures (including critical care time)  Angiocath insertion Performed by: Virgel Manifold  Consent: Verbal consent obtained. Risks and benefits: risks, benefits and alternatives were discussed Time out: Immediately prior to procedure a "time out" was called to verify the correct patient, procedure, equipment, support staff and site/side marked as required.  Preparation: Patient was prepped and draped in the usual sterile fashion.  Vein Location: R AC  Ultrasound Guided  Gauge: 20g  Normal blood return and flush without difficulty Patient tolerance: Patient tolerated the procedure well with no immediate complications.  CENTRAL LINE Performed by: Virgel Manifold Consent: The procedure was performed in an emergent situation. Required items: required blood products, implants, devices, and special equipment available Patient identity confirmed: arm band and provided demographic data Time out: Immediately prior to procedure a "time out" was called to verify the correct patient, procedure, equipment, support staff and site/side marked as required. Indications:  vascular access Anesthesia: local infiltration Local anesthetic: lidocaine 1% w/o epinephrine Anesthetic total: 3 ml Patient sedated: no Preparation: skin prepped with 2% chlorhexidine Skin prep agent dried: skin prep agent completely dried prior to procedure Sterile barriers: sterile barriers used - cap, mask, sterile gloves, and large sterile sheet Hand hygiene: hand hygiene performed prior to central venous catheter insertion  Location details: R femoral  Catheter type: triple lumen Catheter size: 7 Fr Pre-procedure: landmarks identified Ultrasound guidance: yes Successful placement: yes Post-procedure: line sutured and dressing applied Assessment: blood return through all parts, free fluid flow, placement verified by x-ray and no pneumothorax on x-ray Patient tolerance: Patient tolerated the procedure well with no immediate complications.  CRITICAL CARE Performed by: Virgel Manifold Total critical care time: 35 minutes Critical care time was exclusive of separately billable procedures and treating other patients. Critical care was necessary to treat or prevent imminent or life-threatening deterioration. Critical care was time spent personally by me on the following activities: development of treatment plan with patient and/or surrogate as well as nursing, discussions with consultants, evaluation of  patient's response to treatment, examination of patient, obtaining history from patient or surrogate, ordering and performing treatments and interventions, ordering and review of laboratory studies, ordering and review of radiographic studies, pulse oximetry and re-evaluation of patient's condition.   Labs Review Labs Reviewed  COMPREHENSIVE METABOLIC PANEL - Abnormal; Notable for the following:    Calcium 8.5 (*)    Total Protein 5.6 (*)    Albumin 2.0 (*)    ALT 10 (*)    All other components within normal limits  CBC WITH DIFFERENTIAL/PLATELET - Abnormal; Notable for the  following:    WBC 12.5 (*)    Hemoglobin 10.8 (*)    HCT 34.5 (*)    Neutro Abs 10.3 (*)    All other components within normal limits  URINALYSIS, ROUTINE W REFLEX MICROSCOPIC (NOT AT [facility]) - Abnormal; Notable for the following:    APPearance TURBID (*)    Hgb urine dipstick MODERATE (*)    Protein, ur 100 (*)    Leukocytes, UA LARGE (*)    All other components within normal limits  URINE MICROSCOPIC-ADD ON - Abnormal; Notable for the following:    Bacteria, UA MANY (*)    All other components within normal limits  I-STAT CG4 LACTIC ACID, ED - Abnormal; Notable for the following:    Lactic Acid, Venous 2.30 (*)    All other components within normal limits  CULTURE, BLOOD (ROUTINE X 2)  CULTURE, BLOOD (ROUTINE X 2)  URINE CULTURE  TROPONIN I  I-STAT CG4 LACTIC ACID, ED    Imaging Review Dg Chest Port 1 View  10/22/2014   CLINICAL DATA:  Weakness, shortness of breath  EXAM: PORTABLE CHEST 1 VIEW  COMPARISON:  09/03/2014  FINDINGS: There is no focal parenchymal opacity. There is no pleural effusion or pneumothorax. The heart and mediastinal contours are unremarkable. There is a dual lead cardiac pacemaker.  There is evidence of prior posterior cervical fusion.  IMPRESSION: No active disease.   Electronically Signed   By: Kathreen Devoid   On: 10/22/2014 18:36   I have personally reviewed and evaluated these images and lab results as part of my medical decision-making.   EKG Interpretation   Date/Time:  Sunday October 22 2014 16:50:57 EDT Ventricular Rate:  80 PR Interval:  125 QRS Duration: 81 QT Interval:  471 QTC Calculation: 543 R Axis:   78 Text Interpretation:  Atrial-paced rhythm Abnormal ECG Confirmed by Wilson Singer   MD, Delvonte Berenson ([id number]) on 10/22/2014 5:23:31 PM      MDM   Final diagnoses:  Severe sepsis ([place])  Narcotic overdose, accidental or unintentional, initial encounter  UTI (lower urinary tract infection)    44yF with decreased mental status. Likely sepsis.  Respiratory rate not as high as I would expect a septic pt to be though. May be some component of opiate overdose. On oxycodone. Apparently requested more than prescribed amount and given additional this morning. Narcan. Sepsis work-up and treatment initiated. 30cc/kg bolus. Assess fluid responsiveness. Prior notes mention baseline systolic pressures in 96E. Empiric abx ordered. PCN allergy. Will exchange foley. Labs including blood/urine cultures. Will ultimately need admit. Full Code.   Much more awake after narcan. Now conversing. Doesn't explain fever though. BP improving.   Difficult access. [name] placed RUE PIV infiltrated. R femoral central line placed.   Clinically very responsive to narcan with significant improvements in mental status. Has need more than one dose. Will place on gtt. Lactic acid has normalized. Renal function fine.  Virgel Manifold, MD 10/29/14 2250

## 2014-10-22 NOTE — ED Notes (Signed)
Pt is from home.  Her mother is a caregiver.  She is quadriplegic.  Mother states that Pt was "slow to respond" starting at 3am this am.  Mother states she has been lethargic and began hallucinating seeing her brother (who was not there).  Pt typically gets 5mg  hydrocodone.  Pts mother gave her 20mg  Hydrocodone at 9am )Pt was already lethargic at that time).  Urine in foley bag is amber and has some sediment.  Pt was A&O x4 with EMS but is sleepy.  BP 104/72 P 92 94% RA  RR16  CBG 101

## 2014-10-22 NOTE — Progress Notes (Signed)
ANTIBIOTIC CONSULT NOTE - INITIAL  Pharmacy Consult for Vancomycin / Levaquin / Aztreonam Indication: Sepsis  Allergies  Allergen Reactions  . Erythromycin Nausea And Vomiting  . Nitrofurantoin Monohyd Macro Nausea And Vomiting  . Penicillins Hives    Has patient had a PCN reaction causing immediate rash, facial/tongue/throat swelling, SOB or lightheadedness with hypotension: Yes Has patient had a PCN reaction causing severe rash involving mucus membranes or skin necrosis: Yes Has patient had a PCN reaction that required hospitalization Yes- went to the DR Has patient had a PCN reaction occurring within the last 10 years: No-childhood allergy If all of the above answers are "NO", then may proceed with Cephalosporin use.     Patient Measurements: Height: 5\' 2"  (157.5 cm) Weight: 103 lb (46.72 kg) IBW/kg (Calculated) : 50.1 Adjusted Body Weight:   Vital Signs: Temp: 100.6 F (38.1 C) (10/02 1633) Temp Source: Rectal (10/02 1633) BP: 135/121 mmHg (10/02 1800) Pulse Rate: 86 (10/02 1630) Intake/Output from previous day:   Intake/Output from this shift: Total I/O In: 500 [I.V.:500] Out: -   Labs:  Recent Labs  10/22/14 1800  WBC 12.5*  HGB 10.8*  PLT 388   CrCl cannot be calculated (Patient has no serum creatinine result on file.). No results for input(s): VANCOTROUGH, VANCOPEAK, VANCORANDOM, GENTTROUGH, GENTPEAK, GENTRANDOM, TOBRATROUGH, TOBRAPEAK, TOBRARND, AMIKACINPEAK, AMIKACINTROU, AMIKACIN in the last 72 hours.   Microbiology: No results found for this or any previous visit (from the past 720 hour(s)).  Medical History: Past Medical History  Diagnosis Date  . Asthma   . Pleurisy   . GERD (gastroesophageal reflux disease)   . Quadriparesis ([place])   . Pacemaker    Assessment: 44 yoF with quadriplegia, pacemaker, chronic hypotension,and hx sacral decubitus presents from home with AMS due to unintentional opiate overdose, improved with narcan.  Noted to have  amber urine with sediment, fever, elevated lactate, leukocytosis, and hypotension.  Pharmacy consulted to start vancomycin, levaquin, and aztreonam for sepsis.  Pt has allergy to penicillins.  First doses ordered in ED. Note low wt 46.7kg.   Anti-infectives 10/2 >> Vanc  >> 10/2 >> Aztreonam  >>   10/2 >> Levaquin >>  Vitals/Labs WBC: Elevated Tm24h: 100.6 Renal: SCr 0.77, CrCl ~66 (N>100) - likely overestimated with quadriplegia Elevated LA  Cultures 10/2 bloodx2: IP 10/2 urine: ordered   Goal of Therapy:  Vancomycin trough level 15-20 mcg/ml Eradication of infection  Plan:  Aztreonam 2g IV x 1, then 1g IV q8h Levaquin 750mg  IV q24h Vancomycin 1g IV q24h F/u renal fxn, cultures, clinical course  Ralene Bathe, PharmD, BCPS 10/22/2014, 6:49 PM  Pager: [phone]

## 2014-10-22 NOTE — ED Notes (Signed)
Dr. Wilson Singer unable to find another vein with ultrasound for second IV placement.

## 2014-10-22 NOTE — ED Notes (Signed)
2L O2 via  initiated.

## 2014-10-22 NOTE — ED Notes (Signed)
Foley catheter sized 33F in place on arrival to ED, removed.  There was significant leakage around tubing.

## 2014-10-22 NOTE — ED Notes (Signed)
RN notified providers of suspected code sepsis.  Dr. Wilson Singer to room to evaluate Pt.

## 2014-10-22 NOTE — ED Notes (Signed)
Bed: WA03 Expected date:  Expected time:  Means of arrival:  Comments: EMS- AMS, UTI?-paraplegic

## 2014-10-23 DIAGNOSIS — A419 Sepsis, unspecified organism: Secondary | ICD-10-CM | POA: Diagnosis present

## 2014-10-23 DIAGNOSIS — T40604S Poisoning by unspecified narcotics, undetermined, sequela: Secondary | ICD-10-CM

## 2014-10-23 DIAGNOSIS — D509 Iron deficiency anemia, unspecified: Secondary | ICD-10-CM | POA: Diagnosis present

## 2014-10-23 DIAGNOSIS — E876 Hypokalemia: Secondary | ICD-10-CM | POA: Diagnosis present

## 2014-10-23 DIAGNOSIS — L89154 Pressure ulcer of sacral region, stage 4: Secondary | ICD-10-CM | POA: Diagnosis present

## 2014-10-23 DIAGNOSIS — R6520 Severe sepsis without septic shock: Secondary | ICD-10-CM

## 2014-10-23 DIAGNOSIS — D72829 Elevated white blood cell count, unspecified: Secondary | ICD-10-CM | POA: Diagnosis present

## 2014-10-23 LAB — BASIC METABOLIC PANEL
Anion gap: 4 — ABNORMAL LOW (ref 5–15)
BUN: 13 mg/dL (ref 6–20)
CALCIUM: 8.1 mg/dL — AB (ref 8.9–10.3)
CHLORIDE: 113 mmol/L — AB (ref 101–111)
CO2: 21 mmol/L — ABNORMAL LOW (ref 22–32)
CREATININE: 0.41 mg/dL — AB (ref 0.44–1.00)
Glucose, Bld: 77 mg/dL (ref 65–99)
Potassium: 3.4 mmol/L — ABNORMAL LOW (ref 3.5–5.1)
SODIUM: 138 mmol/L (ref 135–145)

## 2014-10-23 LAB — CBC
HCT: 29.6 % — ABNORMAL LOW (ref 36.0–46.0)
Hemoglobin: 9.3 g/dL — ABNORMAL LOW (ref 12.0–15.0)
MCH: 27.6 pg (ref 26.0–34.0)
MCHC: 31.4 g/dL (ref 30.0–36.0)
MCV: 87.8 fL (ref 78.0–100.0)
PLATELETS: 317 10*3/uL (ref 150–400)
RBC: 3.37 MIL/uL — AB (ref 3.87–5.11)
RDW: 15.2 % (ref 11.5–15.5)
WBC: 8.5 10*3/uL (ref 4.0–10.5)

## 2014-10-23 LAB — MRSA PCR SCREENING: MRSA BY PCR: NEGATIVE

## 2014-10-23 LAB — LACTIC ACID, PLASMA: Lactic Acid, Venous: 0.7 mmol/L (ref 0.5–2.0)

## 2014-10-23 LAB — PROCALCITONIN: Procalcitonin: 0.43 ng/mL

## 2014-10-23 MED ORDER — OXYCODONE HCL 5 MG PO TABS
5.0000 mg | ORAL_TABLET | ORAL | Status: DC | PRN
  Administered 2014-10-23 – 2014-10-26 (×18): 5 mg via ORAL
  Filled 2014-10-23 (×18): qty 1

## 2014-10-23 MED ORDER — DULOXETINE HCL 20 MG PO CPEP
20.0000 mg | ORAL_CAPSULE | Freq: Two times a day (BID) | ORAL | Status: DC
  Administered 2014-10-23 – 2014-10-26 (×7): 20 mg via ORAL
  Filled 2014-10-23 (×7): qty 1

## 2014-10-23 MED ORDER — ENOXAPARIN SODIUM 30 MG/0.3ML ~~LOC~~ SOLN
30.0000 mg | Freq: Every day | SUBCUTANEOUS | Status: DC
  Administered 2014-10-23 – 2014-10-25 (×3): 30 mg via SUBCUTANEOUS
  Filled 2014-10-23 (×3): qty 0.3

## 2014-10-23 MED ORDER — ZOLPIDEM TARTRATE 5 MG PO TABS
5.0000 mg | ORAL_TABLET | Freq: Once | ORAL | Status: AC
  Administered 2014-10-23: 5 mg via ORAL
  Filled 2014-10-23: qty 1

## 2014-10-23 MED ORDER — CITALOPRAM HYDROBROMIDE 20 MG PO TABS
20.0000 mg | ORAL_TABLET | Freq: Every day | ORAL | Status: DC
  Administered 2014-10-23 – 2014-10-26 (×4): 20 mg via ORAL
  Filled 2014-10-23 (×4): qty 1

## 2014-10-23 MED ORDER — FERROUS SULFATE 325 (65 FE) MG PO TABS
325.0000 mg | ORAL_TABLET | Freq: Three times a day (TID) | ORAL | Status: DC
  Administered 2014-10-23 – 2014-10-26 (×9): 325 mg via ORAL
  Filled 2014-10-23 (×10): qty 1

## 2014-10-23 MED ORDER — SODIUM CHLORIDE 0.9 % IJ SOLN
10.0000 mL | INTRAMUSCULAR | Status: DC | PRN
  Administered 2014-10-24: 10 mL
  Administered 2014-10-25: 20 mL
  Administered 2014-10-25 – 2014-10-26 (×4): 10 mL
  Filled 2014-10-23 (×6): qty 40

## 2014-10-23 NOTE — Care Management Note (Signed)
Case Management Note  Patient Details  Name: Alsie Younes MRN: [id number] Date of Birth: 10-01-1970  Subjective/Objective:                  sepsis  Action/Plan: Date:  Oct. 03, 2016 U.R. performed for needs and level of care. Will continue to follow for Case Management needs.  Velva Harman, RN, BSN, [place]   [phone]  Expected Discharge Date:                  Expected Discharge Plan:  Home/Self Care  In-House Referral:  Clinical Social Work  Discharge planning Services  CM Consult  Post Acute Care Choice:  NA Choice offered to:  NA  DME Arranged:    DME Agency:     HH Arranged:    [place] Agency:     Status of Service:  In process, will continue to follow  Medicare Important Message Given:    Date Medicare IM Given:    Medicare IM give by:    Date Additional Medicare IM Given:    Additional Medicare Important Message give by:     If discussed at [place] of Stay Meetings, dates discussed:    Additional Comments:  Leeroy Cha, RN 10/23/2014, 12:10 PM

## 2014-10-23 NOTE — H&P (Signed)
[facility] Admission History and Physical       Angela Page GXQ:[id number] DOB: Feb 20, 1970 DOA: 10/22/2014  Referring physician:  PCP: No PCP Per Patient  Specialists:   Chief Complaint: Weakness, Decreasing Responsiveness, and SOB  HPI: Angela Page is a 44 y.o. 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   She reports that she was having increased pain that was unrelieved after 1 dose of the medicine and her mother gave her more medicine a total of 20 mg of Roxicodone.   She was found to have hypoxemia with O2 sats into the 80's and was placed on NCO2.   She was   Administered IV Narcan x 2 doses and had improvement,   She was aslo hypotensive with blood pressures at 65/42.  A Lactic Acid level was found at 2.30, and Positive Urinalysis and a Sepsis Workup was initiated.   She was placed on IV Vancomcyin Levaquin and Aztreonam Rx and Flid resuscitation was ordered.  Phenylephrine was ordered and she was admitted to the ICU.     Review of Systems:  Constitutional: No Weight Loss, No Weight Gain, Night Sweats, Fevers, Chills, Dizziness, Light Headedness, Fatigue, or Generalized Weakness HEENT: No Headaches, Difficulty Swallowing,Tooth/Dental Problems,Sore Throat,  No Sneezing, Rhinitis, Ear Ache, Nasal Congestion, or Post Nasal Drip,  Cardio-vascular:  No Chest pain, Orthopnea, PND, Edema in Lower Extremities, Anasarca, Dizziness, Palpitations  Resp: No Dyspnea, No DOE, No Productive Cough, No Non-Productive Cough, No Hemoptysis, No Wheezing.    GI: No Heartburn, Indigestion, Abdominal Pain, Nausea, Vomiting, Diarrhea, Constipation, Hematemesis, Hematochezia, Melena, Change in Bowel Habits,  Loss of Appetite  GU: No Dysuria, No Change in Color of Urine, No Urgency  or Urinary Frequency, No Flank pain.  Musculoskeletal: No Joint Pain or Swelling, No Decreased Range of Motion, No Back Pain.  Neurologic: No Syncope, No Seizures, Muscle Weakness, Paresthesia, Vision Disturbance or Loss, No Diplopia, No Vertigo, No Difficulty Walking,  Skin: No Rash or Lesions. Psych: No Change in Mood or Affect, No Depression or Anxiety, No Memory loss, No Confusion, or Hallucinations   Past Medical History  Diagnosis Date  . Asthma   . Pleurisy   . GERD (gastroesophageal reflux disease)   . Quadriparesis ([place])   . Pacemaker      Past Surgical History  Procedure Laterality Date  . Appendectomy    . Cardiac surgery    . I&d extremity  10/28/2011    Procedure: IRRIGATION AND DEBRIDEMENT EXTREMITY;  Surgeon: Angela Must, MD;  Location: [facility];  Service: Orthopedics;  Laterality: Right;  Irrigation and debridement right middle finger      Prior to Admission medications   Medication Sig Start Date End Date Taking? Authorizing Provider  acetaminophen (TYLENOL) 325 MG tablet Take 2 tablets (650 mg total) by mouth every 6 (six) hours as needed for fever. 09/06/14  Yes Angela Lis, MD  albuterol (PROVENTIL HFA;VENTOLIN HFA) 108 (90 BASE) MCG/ACT inhaler Inhale 2 puffs into the lungs every 6 (six) hours as needed. For shortness of breath    Yes Historical Provider, MD  baclofen (LIORESAL) 10 MG tablet Take 1 tablet by mouth at bedtime. Along with Baclofen 20mg  for a total of 30 mgs at night 10/05/14  Yes Historical Provider, MD  baclofen (LIORESAL) 20 MG tablet Take 20 mg by mouth 3 (three) times daily. Take 20 mg in the morning and afternoon and then take 20mg  + a 10mg  tablet at night for a total of 30mg  at night 07/04/14  Yes Historical Provider, MD  citalopram (CELEXA) 20 MG tablet Take 20 mg by mouth daily. 07/04/14  Yes Historical Provider, MD  collagenase (SANTYL) ointment Apply 1 application topically daily as needed (wounds).    Yes Historical  Provider, MD  [employer] VITAMIN B12 1000 MCG tablet Take 1 tablet by mouth daily. 08/22/14  Yes Historical Provider, MD  doxycycline (VIBRA-TABS) 100 MG tablet Take 100 mg by mouth every 12 (twelve) hours. continous 10/12/14  Yes Historical Provider, MD  DULoxetine (CYMBALTA) 20 MG capsule Take 20 mg by mouth 2 (two) times daily. 07/04/14  Yes Historical Provider, MD  feeding supplement, ENSURE ENLIVE, (ENSURE ENLIVE) LIQD Take 237 mLs by mouth 2 (two) times daily between meals. 09/06/14  Yes Angela Lis, MD  ferrous sulfate 325 (65 FE) MG tablet Take 1 tablet (325 mg total) by mouth 3 (three) times daily. 08/04/14  Yes Angela Canterbury, MD  gabapentin (NEURONTIN) 400 MG capsule Take 400 mg by mouth 3 (three) times daily. 10/18/14  Yes Historical Provider, MD  ibuprofen (ADVIL,MOTRIN) 200 MG tablet Take 400 mg by mouth every 6 (six) hours as needed for moderate pain.   Yes Historical Provider, MD  oxyCODONE (OXY IR/ROXICODONE) 5 MG immediate release tablet Take 5-20 mg by mouth every 4 (four) hours as needed for severe pain.   Yes Historical Provider, MD  zinc oxide (BALMEX) 11.3 % CREA cream Apply 1 application topically 3 (three) times daily as needed (for skin redness/irritation).   Yes Historical Provider, MD  zolpidem (AMBIEN) 5 MG tablet Take 1 tablet (5 mg total) by mouth at bedtime as needed for sleep. 09/06/14  Yes Angela Lis, MD  ciprofloxacin (CIPRO) 500 MG tablet Take 1 tablet (500 mg total) by mouth 2 (two) times daily. Patient not taking: Reported on 10/22/2014 09/06/14   Angela Lis, MD  omeprazole (PRILOSEC) 20 MG capsule Take 1 capsule (20 mg total) by mouth daily. Patient not taking: Reported on 10/22/2014 08/04/14   Angela Canterbury, MD  oxyCODONE (ROXICODONE) 15 MG immediate release tablet Take 1 tablet (15 mg total) by mouth every 4 (four) hours as needed. for pain Patient not taking: Reported on 10/22/2014 09/06/14   Angela Lis, MD     Allergies  Allergen Reactions  . Erythromycin  Nausea And Vomiting  . Nitrofurantoin Monohyd Macro Nausea And Vomiting  . Penicillins Hives    Has patient had a PCN reaction causing immediate rash, facial/tongue/throat swelling, SOB or lightheadedness with hypotension: Yes Has patient had a PCN reaction causing severe rash involving mucus membranes or skin necrosis: Yes Has patient had a PCN reaction that required hospitalization Yes- went to the DR Has patient had a PCN reaction occurring within the last 10 years: No-childhood allergy If all of the above answers are "NO", then may proceed with Cephalosporin use.     Social History:  reports that she has been smoking Cigarettes.  She has a 25 pack-year smoking history. She does not have any smokeless tobacco history on file. She reports that she drinks alcohol. She reports that she does not use illicit drugs.    Family History  Problem Relation Age of Onset  . Hypertension Maternal Uncle        Physical Exam:  GEN:  Pleasant  Cachectic Quadriplegic  44 y.o. Caucasian female examined and in no acute distress; cooperative with exam Filed Vitals:   10/23/14 0300 10/23/14 0400 10/23/14 0500 10/23/14 0600  BP: 94/57 98/63 112/74 114/71  Pulse: 80 80 80 77  Temp:  97.5 F (36.4 C)    TempSrc:      Resp: 11 13 14 12   Height:      Weight:      SpO2: 100% 100% 100% 100%   Blood pressure 114/71, pulse 77, temperature 97.5 F (36.4 C), temperature source Oral, resp. rate 12, height 5\' 2"  (1.575 m), weight 42.4 kg (93 lb 7.6 oz), SpO2 100 %. PSYCH: She is alert and oriented x4; does not appear anxious does not appear depressed; affect is normal HEENT: Normocephalic and Atraumatic, Mucous membranes pink; PERRLA; EOM intact; Fundi:  Benign;  No scleral icterus, Nares: Patent, Oropharynx: Clear, Fair Dentition,    Neck:  FROM, No Cervical Lymphadenopathy nor Thyromegaly or Carotid Bruit; No JVD; Breasts:: Not examined CHEST WALL: No tenderness CHEST: Normal respiration, clear to  auscultation bilaterally HEART: Regular rate and rhythm; no murmurs rubs or gallops BACK: No kyphosis or scoliosis; No CVA tenderness ABDOMEN: Positive Bowel Sounds, Scaphoid,  Soft Non-Tender, No Rebound or Guarding; No Masses, No Organomegaly Rectal Exam: Not done EXTREMITIES: No  Cyanosis, Clubbing, or Edema;+ Atrophy of Upper and Lower Exts. Genitalia: not examined PULSES: 2+ and symmetric SKIN: Normal hydration no rash or ulceration  CNS:  Alert and Oriented x 4, +C-5 Quadriplegia Vascular: pulses palpable throughout    Labs on Admission:  Basic Metabolic Panel:  Recent Labs Lab 10/22/14 1800 10/23/14 0600  NA 138 138  K 3.9 3.4*  CL 109 113*  CO2 24 21*  GLUCOSE 96 77  BUN 16 13  CREATININE 0.77 0.41*  CALCIUM 8.5* 8.1*   Liver Function Tests:  Recent Labs Lab 10/22/14 1800  AST 18  ALT 10*  ALKPHOS 92  BILITOT 0.5  PROT 5.6*  ALBUMIN 2.0*   No results for input(s): LIPASE, AMYLASE in the last 168 hours. No results for input(s): AMMONIA in the last 168 hours. CBC:  Recent Labs Lab 10/22/14 1800 10/23/14 0600  WBC 12.5* 8.5  NEUTROABS 10.3*  --   HGB 10.8* 9.3*  HCT 34.5* 29.6*  MCV 89.1 87.8  PLT 388 317   Cardiac Enzymes:  Recent Labs Lab 10/22/14 1800  TROPONINI <0.03    BNP (last 3 results) No results for input(s): BNP in the last 8760 hours.  ProBNP (last 3 results) No results for input(s): PROBNP in the last 8760 hours.  CBG: No results for input(s): GLUCAP in the last 168 hours.  Radiological Exams on Admission: Dg Chest Port 1 View  10/22/2014   CLINICAL DATA:  Weakness, shortness of breath  EXAM: PORTABLE CHEST 1 VIEW  COMPARISON:  09/03/2014  FINDINGS: There is no focal parenchymal opacity. There is no pleural effusion or pneumothorax. The heart and mediastinal contours are unremarkable. There is a dual lead cardiac pacemaker.  There is evidence of prior posterior cervical fusion.  IMPRESSION: No active disease.    Electronically Signed   By: Kathreen Devoid   On: 10/22/2014 18:36      Assessment/Plan:   44 y.o. female with  Principal Problem:   1.     Septic shock (HCC)/ Sepsis ([place])   Sepsis Workup   ICU Monitoring   IV Pressor Phenylephrine due to Hypotension   IV Vancomycin Levaquin and  Aztreonam   IVFs     Active Problems:   2.    Hypotension   IV Phenylephrine    IVFs for Fluid Resuscitation     3.    Narcotic overdose- Unintentional   PRN IV Narcan for Lethargy        4.    Urinary tract infectious disease   [place]rien C+S sent     5.    Protein-calorie malnutrition, severe (HCC)   Regular diet   Ensure TID between Meals     6.    Quadriplegia, C5-C7 incomplete ([place])   Since 2014 Trauma     7.    DVT Prophylaxis   Lovenox        Code Status:     FULL CODE        Family Communication:    No Family Present    Disposition Plan:    Inpatient  Status        Time spent:  [facility] Pager [phone]   If [place] Please Contact the Day Rounding Team MD for [facility]  If 7PM-7AM, Please Contact Night-Floor Coverage  [email or web address] Password [email or web address]    ADDENDUM:   Patient was seen and examined on 10/22/2014

## 2014-10-23 NOTE — Progress Notes (Signed)
Patient ID: Angela Page, female   DOB: [birth year], 44 y.o.   MRN: [id number] [facility] PROGRESS NOTE  Angela Page JJO:[id number] DOB: 12/04/1970 DOA: 10/22/2014 PCP: Angela Page in urgent care =; will set up with [facility] on discharge.   Brief narrative:    44 y.o. 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 She was given narcan x 2 doses and her mental status improved. She was also found to have sepsis adn UTI. She was given vanco, Levaquin and aztreonam and admitted to SDU.   Anticipated discharge: transfer to telemetry today.   Assessment/Plan:    Principal Problem:   Septic shock  / Severe sepsis / Leukocytosis / Urinary tract infectious disease - Septic shock criteria met on admission (fever, tachycardia, tachypnea, leukocytosis, hypotension despite fluid resuscitation, lactic acidosis, source of infection UTI) - Remains hemodynamically stable, does not require pressor support - Continue Levaquin and aztreonam - Transfer to telemetry - Follow up blood culture results         Active Problems:   Acute encephalopathy / Overdose of opiate or related narcotic - Altered mental status due to narcotic overdose - Improved with narcan - Oriented to time, place and person    Protein-calorie malnutrition, severe  - In the context of chronic illness - Diet as tolerated     Depression - Resume cymbalta and celexa     Quadriplegia, C5-C7 incomplete  - Stable     Hypokalemia - Likely from sepsis - Supplemented - Follow up BMP tomorrow am    Iron deficiency anemia - Continue iron supplementation     DVT Prophylaxis  - Lovenox subQ ordered    Code Status: Full.  Family Communication:  plan of care discussed with the patient Disposition Plan: transfer to telemetry.  IV access:  Peripheral IV  Procedures and diagnostic studies:    Dg Chest  Port 1 View 10/22/2014   No active disease.   Electronically Signed   By: Angela Page   On: 10/22/2014 18:36   Medical Consultants:  None   Other Consultants:  Physical therapy WOC  IAnti-Infectives:   Aztreonam 10/2 --> Levaquin 10/2 -->   Angela Page  [facility] Pager [phone]  Time spent in minutes: 25 minutes  If 7PM-7AM, please contact night-coverage [email or web address] Password [email or web address] 10/23/2014, 12:08 PM   LOS: 1 day    HPI/Subjective: No acute overnight events. Patient reports feeling better.   Objective: Filed Vitals:   10/23/14 0900 10/23/14 1000 10/23/14 1100 10/23/14 1200  BP: 103/59 111/65 110/59 105/57  Pulse: 82 79  83  Temp:      TempSrc:      Resp: $Remo'11 11 12 11  'KejZF$ Height:      Weight:      SpO2: 100% 100%  100%    Intake/Output Summary (Last 24 hours) at 10/23/14 1208 Last data filed at 10/23/14 1200  Gross per 24 hour  Intake   4790 ml  Output   1950 ml  Net   2840 ml    Exam:   General:  Pt is alert, follows commands appropriately, not in acute distress  Cardiovascular: Regular rate and rhythm, S1/S2, no murmurs  Respiratory: Clear to auscultation bilaterally, no wheezing, no crackles, no rhonchi  Abdomen: Soft, non tender, non distended, bowel sounds present  Extremities: No edema, pulses DP and PT palpable bilaterally  Neuro: Quadriplegia (+)  Data Reviewed: Basic Metabolic Panel:  Recent Labs Lab 10/22/14 1800 10/23/14 0600  NA 138 138  K 3.9 3.4*  CL 109 113*  CO2 24 21*  GLUCOSE 96 77  BUN 16 13  CREATININE 0.77 0.41*  CALCIUM 8.5* 8.1*   Liver Function Tests:  Recent Labs Lab 10/22/14 1800  AST 18  ALT 10*  ALKPHOS 92  BILITOT 0.5  PROT 5.6*  ALBUMIN 2.0*   No results for input(s): LIPASE, AMYLASE in the last 168 hours. No results for input(s): AMMONIA in the last 168 hours. CBC:  Recent Labs Lab 10/22/14 1800 10/23/14 0600  WBC 12.5* 8.5  NEUTROABS 10.3*  --   HGB 10.8* 9.3*  HCT 34.5*  29.6*  MCV 89.1 87.8  PLT 388 317   Cardiac Enzymes:  Recent Labs Lab 10/22/14 1800  TROPONINI <0.03   BNP: Invalid input(s): POCBNP CBG: No results for input(s): GLUCAP in the last 168 hours.  Recent Results (from the past 240 hour(s))  MRSA PCR Screening     Status: None   Collection Time: 10/23/14 12:59 AM  Result Value Ref Range Status   MRSA by PCR NEGATIVE NEGATIVE Final     Scheduled Meds: . aztreonam  1 g Intravenous 3 times per day  . baclofen  30 mg Oral QHS  . enoxaparin (LOVENOX) injection  30 mg Subcutaneous QHS  . feeding supplement (ENSURE ENLIVE)  237 mL Oral BID BM  . levofloxacin (LEVAQUIN) IV  750 mg Intravenous Q24H  . vancomycin  1,000 mg Intravenous Q24H   Continuous Infusions: . sodium chloride 125 mL/hr at 10/23/14 1047  . phenylephrine (NEO-SYNEPHRINE) Adult infusion

## 2014-10-23 NOTE — Consult Note (Signed)
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 Patient followed by [facility] and has familycare givers. Wound type:Pressure Pressure Ulcer POA: Yes Measurement:Sacrum (Stage 4):  5cm x 8.5cm x 1.6cm.  Hypergranulation tissue from 11-1 o'clock treated with 1 time application of silver nitrate sticks. Wound bed is pink, moist, non-granulating and with small amount of serous exudate. Right upper buttock (Stage 4):  2cm x 3cm x 1.6cm with undermining at 12 o'clock measuring 1.8cm, 3 o'clock measuring 1cm and 8 o'clock measuring 2.5cm.  Wound bed is red, moist with serous exudate. Right ischial tuberosity (stage4):  2.5cm x 1.5cm x 1cm with undermining noted at 10 o'clock measuring 1cm.  Wound bed is light pink, non-granulating, scant exudate. Wound bed:As described above Drainage (amount, consistency, odor) As described above. Periwound:Intaact with evidence of previous wound healing Dressing procedure/placement/frequency: My initial dressings will be saline moistened gauze to fill defects after cleansing with NS.  Subsequent dressing changes will be with silver hydrofiber and used once daily. Turning and repositioning will be reinforced and it is stressed to minimize the supine position.  When positioned, patient states, "I cannot see the television!".  She agrees to stay on her side for at least 1 hour. Family member in room reports that patient is moving to [facility] in [place] tomorrow for a kidney stone procedure.  Bedside RN and I have not yet heard that POC. Bilateral pressure redistribution boots for the heels are provided.  Patient and caregiver state that she has a prescription for these, but that they have not yet obtained them.  While the heels are presently intact, the skin is dry and at risk for pressure injury without a redistribution product. [place] nursing team will not follow, but will remain available to  this patient, the nursing and medical teams.  Please re-consult if needed. Thanks, Maudie Flakes, MSN, RN, [place], [place], [place] [phone])

## 2014-10-24 DIAGNOSIS — N390 Urinary tract infection, site not specified: Secondary | ICD-10-CM | POA: Diagnosis present

## 2014-10-24 DIAGNOSIS — B964 Proteus (mirabilis) (morganii) as the cause of diseases classified elsewhere: Secondary | ICD-10-CM | POA: Diagnosis present

## 2014-10-24 MED ORDER — ZOLPIDEM TARTRATE 5 MG PO TABS
5.0000 mg | ORAL_TABLET | Freq: Once | ORAL | Status: AC
  Administered 2014-10-24: 5 mg via ORAL
  Filled 2014-10-24: qty 1

## 2014-10-24 MED ORDER — BOOST PLUS PO LIQD
237.0000 mL | Freq: Three times a day (TID) | ORAL | Status: DC | PRN
  Filled 2014-10-24: qty 237

## 2014-10-24 MED ORDER — ZOLPIDEM TARTRATE 10 MG PO TABS
10.0000 mg | ORAL_TABLET | Freq: Every evening | ORAL | Status: DC | PRN
  Administered 2014-10-24 – 2014-10-25 (×2): 10 mg via ORAL
  Filled 2014-10-24 (×2): qty 1

## 2014-10-24 MED ORDER — PRO-STAT SUGAR FREE PO LIQD
30.0000 mL | Freq: Two times a day (BID) | ORAL | Status: DC
  Administered 2014-10-24 – 2014-10-26 (×4): 30 mL via ORAL
  Filled 2014-10-24 (×5): qty 30

## 2014-10-24 NOTE — Progress Notes (Signed)
Initial Nutrition Assessment  DOCUMENTATION CODES:   Severe malnutrition in context of chronic illness, Underweight  INTERVENTION:  - Will change Ensure order to Boost Plus PRN per pt request, this supplement provides 360 kcal and 14 grams protein - Will order Prostat BID, each supplement provides 100 kcal and 15 grams protein - Assistance at meals - RD will continue to monitor for needs  NUTRITION DIAGNOSIS:   Increased nutrient needs related to wound healing as evidenced by estimated needs.  GOAL:   Patient will meet greater than or equal to 90% of their needs  MONITOR:   PO intake, Supplement acceptance, Weight trends, Labs, I & O's  REASON FOR ASSESSMENT:   Other (Comment) (underweight BMI)  ASSESSMENT:   44 y.o. 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 She reports that she was having increased pain that was unrelieved after 1 dose of the medicine and her mother gave her more medicine a total of 20 mg of Roxicodone. She was found to have hypoxemia with O2 sats into the 80's and was placed on NCO2  Pt seen for MST. BMI indicates underweight status. Pt states she did not eat breakfast this AM as she typically does not eat breakfast. She states that she is feeling hungry and will have lunch order placed later; she declined RD offer to order lunch at this time. She denies abdominal pain or nausea now or PTA.  Pt states that she needs feeding assistance related to quadriplegia. She typically eats at least one good meal/day and will sometimes snack between meals. She drinks Boost at home but states that she typically drinks "one a day, if that" and expresses concern about RN offering her Ensure so often; she would feel more comfortable if she can ask for supplement  when she would like to have it rather than it being offered to her. She denies chewing or swallowing issues with intakes.  She states UBW of 104 lbs and feels that she has been gaining weight recently. Per chart review, pt has lost 17 lbs (15% body weight) in the past 3 months which is significant for time frame. Severe muscle and fat wasting noted during physical assessment.   Not meeting needs; needs increased for multiple stage 4 ulcers. Medications reviewed. Labs reviewed; K: 3.4 mmol/L, Cl: 113 mmol/L, creatinine low, Ca: 8.1 mg/dL.   Diet Order:  Diet regular Room service appropriate?: Yes; Fluid consistency:: Thin  Skin:  Wound (see comment) (stage 4 pressure ulcers to L buttocks and sacrum)  Last BM:  10/1  Height:   Ht Readings from Last 1 Encounters:  10/22/14 5\' 2"  (1.575 m)    Weight:   Wt Readings from Last 1 Encounters:  10/22/14 93 lb 7.6 oz (42.4 kg)    Ideal Body Weight:  50 kg (kg)  BMI:  Body mass index is 17.09 kg/(m^2).  Estimated Nutritional Needs:   Kcal:  1480-1680  Protein:  70-80 grams  Fluid:  2.2 L/day  EDUCATION NEEDS:   No education needs identified at this time     Jarome Matin, RD, LDN Inpatient Clinical Dietitian Pager # [phone] After hours/weekend pager # [phone]

## 2014-10-24 NOTE — Care Management Note (Signed)
Case Management Note  Patient Details  Name: Angela Page MRN: [id number] Date of Birth: [birth year]  Subjective/Objective:    44 y/o f admitted w/Septic shock.BO:[id number]. From home w/mother.Active w/[facility]-HHRN 1x wk-rep Karolee Stamps aware of admission, will follow & await HHRN order @ d/c.Ambulance transp @ d/c.CSW notified.                Action/Plan:d/c home w/HHC.   Expected Discharge Date:                  Expected Discharge Plan:  [place]  In-House Referral:  Clinical Social Work  Discharge planning Services  CM Consult  Post Acute Care Choice:  NA, Home Health [facility] 1x/wk wound care) Choice offered to:  NA  DME Arranged:    DME Agency:     HH Arranged:    [place] Agency:     Status of Service:  In process, will continue to follow  Medicare Important Message Given:    Date Medicare IM Given:    Medicare IM give by:    Date Additional Medicare IM Given:    Additional Medicare Important Message give by:     If discussed at [place] Meetings, dates discussed:    Additional Comments:  Dessa Phi, RN 10/24/2014, 1:47 PM

## 2014-10-24 NOTE — Progress Notes (Addendum)
Patient ID: Angela Page, female   DOB: [birth year], 44 y.o.   MRN: [id number] [facility] PROGRESS NOTE  Brynna Dobos LKT:[id number] DOB: Sep 22, 1970 DOA: 30-Oct-2014 PCP: will have to set up with [facility] on discharge.   Brief narrative:    44 y.o. female with a history of C-5 Quadriplegia since a fall off a Deck in 2014, narcoti abuse, depression, anemia who presented to [facility] ED with altered mental status, confusion. She was given narcan x 2 doses and her mental status improved. She was also found to have sepsis and UTI due proteus mirabilis. She was given vanco, Levaquin and aztreonam and admitted to SDU.   Transferred to telemetry 10/3.  Anticipated discharge: Awaiting sensitivity report. Home likely in next 1-2 days, by 10/26/2014.   Assessment/Plan:    Principal Problem:   Septic shock  / Severe sepsis / Leukocytosis / Urinary tract infectious disease - Criteria for septic shock met on the admission with hypotension despite fluid resuscitation, fever, tachycardia, tachypnea, leukocytosis, lactic acidosis. Source of infection is proteus mirabilis UTI. - Awaiting urine culture sensitivity report. - Initially on vanco, Levaquin and aztreonam. Will narrow down to Levaquin only starting today.  - Blood cultures pending as of 10/4. - Hemodynamically stable, does not require pressor support.         Active Problems:   Acute encephalopathy / Overdose of opiate or related narcotic - Likely from narcotic overdose - Mental status has improved with narcan    Protein-calorie malnutrition, severe  - In the context of chronic illness - Advance diet as tolerated     Depression - Continue cymbalta and celexa  - Stable     Quadriplegia, C5-C7 incomplete  - Stable     Hypokalemia - Secondary to sepsis - Supplemented    Iron deficiency anemia - We will continue iron supplementation     Sacral pressure ulcer, stage 4 - WOC assessment compelted 10/3. - Stage 4 pressure injuries to  right ischial tuberosity, sacrum and right upper buttocks.  - Measurement:Sacrum (Stage 4): 5cm x 8.5cm x 1.6cm. Right upper buttock (Stage 4): 2cm x 3cm x 1.6cm.  Right ischial tuberosity (stage4): 2.5cm x 1.5cm x 1cm  - Dressing procedure/placement/frequency:  With silver hydrofiber, use once daily. Turning and repositioning. Bilateral pressure redistribution boots for the heels provided.     DVT Prophylaxis  - Lovenox subQ ordered while pt in hospital    Code Status: Full.  Family Communication:  plan of care discussed with the patient Disposition Plan: home likely by 10/26/2014.  IV access:  Peripheral IV  Procedures and diagnostic studies:    Dg Chest Port 1 View 30-Oct-2014   No active disease.   Electronically Signed   By: Kathreen Devoid   On: 2014-10-30 18:36   Medical Consultants:  None   Other Consultants:  Physical therapy WOC  IAnti-Infectives:   Aztreonam 2022/10/30 --> Levaquin 30-Oct-2022 --> Vanco October 30, 2022 -->     Leisa Lenz, MD  [facility] Pager [phone]  Time spent in minutes: 25 minutes  If 7PM-7AM, please contact night-coverage [email or web address] Password [email or web address] 10/24/2014, 10:41 AM   LOS: 2 days    HPI/Subjective: No acute overnight events. Patient reports feeling weak.  Objective: Filed Vitals:   10/23/14 1818 10/23/14 2101 10/24/14 0437 10/24/14 1010  BP: $Re'87/52 92/53 96/62 'bvs$ 110/71  Pulse: 80 80 80 80  Temp: 98.9 F (37.2 C) 99.4 F (37.4 C) 98.9 F (37.2 C) 98.3 F (36.8 C)  TempSrc: Oral Oral Oral Oral  Resp: '16 16 16 16  '$ Height:      Weight:      SpO2: 100% 98% 99% 99%    Intake/Output Summary (Last 24 hours) at 10/24/14 1041 Last data filed at 10/24/14 1010  Gross per 24 hour  Intake 2643.33 ml  Output   2875 ml  Net -231.67 ml    Exam:   General:  Pt is alert, not in acute distress  Cardiovascular: rate controlled, S1/S2 (+0  Respiratory: No wheezing, no crackles, no rhonchi  Abdomen: non tender, non distended, (+) BS    Extremities: No leg swelling, appreciate bilateral pulses   Neuro: Quadriplegia (+)  Data Reviewed: Basic Metabolic Panel:  Recent Labs Lab 10/22/14 1800 10/23/14 0600  NA 138 138  K 3.9 3.4*  CL 109 113*  CO2 24 21*  GLUCOSE 96 77  BUN 16 13  CREATININE 0.77 0.41*  CALCIUM 8.5* 8.1*   Liver Function Tests:  Recent Labs Lab 10/22/14 1800  AST 18  ALT 10*  ALKPHOS 92  BILITOT 0.5  PROT 5.6*  ALBUMIN 2.0*   No results for input(s): LIPASE, AMYLASE in the last 168 hours. No results for input(s): AMMONIA in the last 168 hours. CBC:  Recent Labs Lab 10/22/14 1800 10/23/14 0600  WBC 12.5* 8.5  NEUTROABS 10.3*  --   HGB 10.8* 9.3*  HCT 34.5* 29.6*  MCV 89.1 87.8  PLT 388 317   Cardiac Enzymes:  Recent Labs Lab 10/22/14 1800  TROPONINI <0.03   BNP: Invalid input(s): POCBNP CBG: No results for input(s): GLUCAP in the last 168 hours.  Recent Results (from the past 240 hour(s))  MRSA PCR Screening     Status: None   Collection Time: 10/23/14 12:59 AM  Result Value Ref Range Status   MRSA by PCR NEGATIVE NEGATIVE Final     Scheduled Meds: . aztreonam  1 g Intravenous 3 times per day  . baclofen  30 mg Oral QHS  . citalopram  20 mg Oral Daily  . DULoxetine  20 mg Oral BID  . enoxaparin (LOVENOX) injection  30 mg Subcutaneous QHS  . feeding supplement (ENSURE ENLIVE)  237 mL Oral BID BM  . ferrous sulfate  325 mg Oral TID  . levofloxacin (LEVAQUIN) IV  750 mg Intravenous Q24H  . vancomycin  1,000 mg Intravenous Q24H   Continuous Infusions: . sodium chloride 75 mL/hr at 10/24/14 0130

## 2014-10-24 NOTE — Clinical Documentation Improvement (Signed)
Internal Medicine  Can the diagnosis of Acute encephalopathy be further specified in progress notes and discharge summary?   Toxic encephalopathy  Other  Clinically Undetermined  Document any associated diagnoses/conditions.   Supporting Information:  Progress notes 10/3 & 10/4: Presented to [facility] ED with altered mental status, confusion Altered mental status due to narcotic overdose Acute encephalopathy/overdose of opiate or related narcotic Improved with Narcan  ED note: Mother states she has been lethargic and began hallucinating seeing her brother (who was not there)  Please exercise your independent, professional judgment when responding. A specific answer is not anticipated or expected.   Thank You,  [place] [phone]

## 2014-10-25 DIAGNOSIS — D509 Iron deficiency anemia, unspecified: Secondary | ICD-10-CM

## 2014-10-25 DIAGNOSIS — B964 Proteus (mirabilis) (morganii) as the cause of diseases classified elsewhere: Secondary | ICD-10-CM

## 2014-10-25 DIAGNOSIS — E43 Unspecified severe protein-calorie malnutrition: Secondary | ICD-10-CM

## 2014-10-25 DIAGNOSIS — N390 Urinary tract infection, site not specified: Secondary | ICD-10-CM

## 2014-10-25 DIAGNOSIS — G9340 Encephalopathy, unspecified: Secondary | ICD-10-CM

## 2014-10-25 DIAGNOSIS — E876 Hypokalemia: Secondary | ICD-10-CM

## 2014-10-25 LAB — CBC
HCT: 28.1 % — ABNORMAL LOW (ref 36.0–46.0)
Hemoglobin: 8.9 g/dL — ABNORMAL LOW (ref 12.0–15.0)
MCH: 27.6 pg (ref 26.0–34.0)
MCHC: 31.7 g/dL (ref 30.0–36.0)
MCV: 87 fL (ref 78.0–100.0)
PLATELETS: 316 10*3/uL (ref 150–400)
RBC: 3.23 MIL/uL — ABNORMAL LOW (ref 3.87–5.11)
RDW: 15.1 % (ref 11.5–15.5)
WBC: 6.1 10*3/uL (ref 4.0–10.5)

## 2014-10-25 LAB — BASIC METABOLIC PANEL
Anion gap: 5 (ref 5–15)
BUN: 9 mg/dL (ref 6–20)
CALCIUM: 8.7 mg/dL — AB (ref 8.9–10.3)
CO2: 24 mmol/L (ref 22–32)
CREATININE: 0.33 mg/dL — AB (ref 0.44–1.00)
Chloride: 109 mmol/L (ref 101–111)
GFR calc Af Amer: >60 mL/min (ref >60)
GLUCOSE: 76 mg/dL (ref 65–99)
Potassium: 3.2 mmol/L — ABNORMAL LOW (ref 3.5–5.1)
SODIUM: 138 mmol/L (ref 135–145)

## 2014-10-25 LAB — URINE CULTURE: Culture: >=100000

## 2014-10-25 MED ORDER — POTASSIUM CHLORIDE CRYS ER 20 MEQ PO TBCR
40.0000 meq | EXTENDED_RELEASE_TABLET | Freq: Once | ORAL | Status: AC
  Administered 2014-10-25: 40 meq via ORAL
  Filled 2014-10-25: qty 2

## 2014-10-25 MED ORDER — GABAPENTIN 400 MG PO CAPS
400.0000 mg | ORAL_CAPSULE | Freq: Three times a day (TID) | ORAL | Status: DC
  Administered 2014-10-25 – 2014-10-26 (×2): 400 mg via ORAL
  Filled 2014-10-25 (×3): qty 1

## 2014-10-25 MED ORDER — DEXTROSE 5 % IV SOLN: 1.0000 g | INTRAVENOUS | Status: DC

## 2014-10-25 MED ORDER — SULFAMETHOXAZOLE-TRIMETHOPRIM 800-160 MG PO TABS
1.0000 | ORAL_TABLET | Freq: Two times a day (BID) | ORAL | Status: DC
  Administered 2014-10-25 – 2014-10-26 (×3): 1 via ORAL
  Filled 2014-10-25 (×3): qty 1

## 2014-10-25 NOTE — Progress Notes (Signed)
At the beginning of the shift the RN went into the room and was talking to pt and her ex-husband. Pt stated that her ex-husband, who was sitting right next to her, wasn't there and that he was a hallucination that was trying to bother her. I told pt that her ex-husband was sitting right there and pt got mad and said "Stop talking to my hallucination, he's not here. Maybe you have hallucinations." The ex-husband decided to leave because he felt like it was causing more of an issue staying the night. So later, RN gets a phone call that says the pt needs them. RN goes into pt room along with Tech and pt states, "I only have 2 hallucinations left and life or death depend on it. The first hallucination is I need to get control back of my body and the second is to watch tv." RN said what do you need me to do, pt then stated, "I need you to take control of my body and get it to me this hallucination depends on it." Pt then got upset and said, "As long as I believe it, it doesn't matter."    At this moment the pt is asleep. RN will notify day shift nurse of this and MD if come early enough that pt may need a psych evaluation or if it's just a side effect of a medication, either way something isn't right.    Carmela Hurt, RN 12:24 AM 10/25/2014

## 2014-10-25 NOTE — Progress Notes (Signed)
[facility] PROGRESS NOTE  Angela Page QIW:[id number] DOB: Sep 27, 1970 DOA: 10/22/2014 PCP: No PCP Per Patient   Brief narrative 44 year old female with C5 quadriplegia since a fall in 2014, narcotic abuse, anemia, depression, protein calorie malnutrition,  presented to the ED with acute encephalopathy. Patient received 2 doses of Narcan and her mental status improved.   she was found to have sepsis secondary to Proteus UTI. Patient placed on empiric antibiotics and admitted to stepdown unit.   Assessment/Plan: Principal problem Septic shock secondary to Proteus UTI. Admitted to stepdown with aggressive IV resuscitation. BP stable. cx mostly sensitive. Narrow abx to bactrim. Blood cx pending. will plan to treat for 10-14 days.  Active problems: Acute encephalopathy/opiate overdose Likely from narcotic overdose and sepsis. Mental status improved but noted for episode of hallucination during the night.  Monitor for now.  Minimize narcotics and benzos.   Hypokalemia Replenish  Protein calorie malnutrition Continue feeding supplements  Depression Continue Cymbalta.   iron deficiency anemia Continue iron supplements.  Stage 4 sacral pressure ulcers  noninfected. Appreciate wound care recommendations.    diet: Regular DVT prophylaxis: Subcutaneous Lovenox  Code Status: full code Family Communication: none at bedside Disposition Plan: home in am if [place]   Consultants:  none  Procedures:  none  Antibiotics:  Vanco, levaquin and aztreonam 10/3-10/5  Bactrim 10/5---    HPI/Subjective:  patient seen and examined. Reportedly was having visual hallucinations during the night. remains afebrile. No other overnight issues.   Objective: Filed Vitals:   10/25/14 1415  BP: 129/88  Pulse: 79  Temp: 98 F (36.7 C)  Resp: 16    Intake/Output Summary (Last 24 hours) at 10/25/14 1512 Last data filed at 10/25/14 1436  Gross per 24 hour  Intake   2280 ml   Output   1850 ml  Net    430 ml   Filed Weights   10/22/14 1731 10/22/14 2245  Weight: 46.72 kg (103 lb) 42.4 kg (93 lb 7.6 oz)    Exam:   General:   middle aged thin built female in no acute distress  HEENT: Trach site clean, no pallor, moist oral mucosa  Chest: Clear to auscultation bilaterally  CVS: Normal S1 and S2, no murmurs rub or gallop  GI: Soft, nondistended, nontender, bowel sounds present, right femoral central line  Musculoskeletal musculoskeletal: Warm, no edema  CNS: Alert and oriented    Data Reviewed: Basic Metabolic Panel:  Recent Labs Lab 10/22/14 1800 10/23/14 0600 10/25/14 0405  NA 138 138 138  K 3.9 3.4* 3.2*  CL 109 113* 109  CO2 24 21* 24  GLUCOSE 96 77 76  BUN 16 13 9   CREATININE 0.77 0.41* 0.33*  CALCIUM 8.5* 8.1* 8.7*   Liver Function Tests:  Recent Labs Lab 10/22/14 1800  AST 18  ALT 10*  ALKPHOS 92  BILITOT 0.5  PROT 5.6*  ALBUMIN 2.0*   No results for input(s): LIPASE, AMYLASE in the last 168 hours. No results for input(s): AMMONIA in the last 168 hours. CBC:  Recent Labs Lab 10/22/14 1800 10/23/14 0600 10/25/14 0405  WBC 12.5* 8.5 6.1  NEUTROABS 10.3*  --   --   HGB 10.8* 9.3* 8.9*  HCT 34.5* 29.6* 28.1*  MCV 89.1 87.8 87.0  PLT 388 317 316   Cardiac Enzymes:  Recent Labs Lab 10/22/14 1800  TROPONINI <0.03   BNP (last 3 results) No results for input(s): BNP in the last 8760 hours.  ProBNP (last 3 results) No results  for input(s): PROBNP in the last 8760 hours.  CBG: No results for input(s): GLUCAP in the last 168 hours.  Recent Results (from the past 240 hour(s))  Blood Culture (routine x 2)     Status: None (Preliminary result)   Collection Time: 10/22/14  4:52 PM  Result Value Ref Range Status   Specimen Description BLOOD RIGHT ARM  5 ML IN [facility] BOTTLE  Final   Special Requests NONE  Final   Culture   Final    NO GROWTH 2 DAYS Performed at [facility]    Report Status PENDING   Incomplete  Urine culture     Status: None   Collection Time: 10/22/14  6:30 PM  Result Value Ref Range Status   Specimen Description URINE, CLEAN CATCH  Final   Special Requests NONE  Final   Culture   Final    >=100,000 COLONIES/mL PROTEUS MIRABILIS Performed at [facility]    Report Status 10/25/2014 FINAL  Final   Organism ID, Bacteria PROTEUS MIRABILIS  Final      Susceptibility   Proteus mirabilis - MIC*    AMPICILLIN <=2 SENSITIVE Sensitive     CEFAZOLIN 8 SENSITIVE Sensitive     CEFTRIAXONE <=1 SENSITIVE Sensitive     CIPROFLOXACIN 2 INTERMEDIATE Intermediate     GENTAMICIN <=1 SENSITIVE Sensitive     IMIPENEM 8 INTERMEDIATE Intermediate     NITROFURANTOIN 128 RESISTANT Resistant     TRIMETH/SULFA <=20 SENSITIVE Sensitive     AMPICILLIN/SULBACTAM 4 SENSITIVE Sensitive     PIP/TAZO <=4 SENSITIVE Sensitive     * >=100,000 COLONIES/mL PROTEUS MIRABILIS  Blood Culture (routine x 2)     Status: None (Preliminary result)   Collection Time: 10/22/14  7:45 PM  Result Value Ref Range Status   Specimen Description BLOOD FEMORAL ARTERY  Final   Special Requests BOTTLES DRAWN AEROBIC AND ANAEROBIC 5ML  Final   Culture   Final    NO GROWTH 2 DAYS Performed at [facility]    Report Status PENDING  Incomplete  MRSA PCR Screening     Status: None   Collection Time: 10/23/14 12:59 AM  Result Value Ref Range Status   MRSA by PCR NEGATIVE NEGATIVE Final    Comment:        The GeneXpert MRSA Assay (FDA approved for NASAL specimens only), is one component of a comprehensive MRSA colonization surveillance program. It is not intended to diagnose MRSA infection nor to guide or monitor treatment for MRSA infections.      Studies: No results found.  Scheduled Meds: . baclofen  30 mg Oral QHS  . citalopram  20 mg Oral Daily  . DULoxetine  20 mg Oral BID  . enoxaparin (LOVENOX) injection  30 mg Subcutaneous QHS  . feeding supplement (PRO-STAT SUGAR FREE 64)  30  mL Oral BID  . ferrous sulfate  325 mg Oral TID  . sulfamethoxazole-trimethoprim  1 tablet Oral Q12H   Continuous Infusions: . sodium chloride 75 mL/hr at 10/25/14 0527       Time spent: 25 minutes    Makale Pindell  [facility] Pager [phone]. If 7PM-7AM, please contact night-coverage at [email or web address], password [facility] 10/25/2014, 3:12 PM  LOS: 3 days

## 2014-10-26 DIAGNOSIS — G8254 Quadriplegia, C5-C7 incomplete: Secondary | ICD-10-CM

## 2014-10-26 DIAGNOSIS — G9341 Metabolic encephalopathy: Secondary | ICD-10-CM | POA: Diagnosis present

## 2014-10-26 DIAGNOSIS — R6521 Severe sepsis with septic shock: Secondary | ICD-10-CM

## 2014-10-26 DIAGNOSIS — L89154 Pressure ulcer of sacral region, stage 4: Secondary | ICD-10-CM

## 2014-10-26 DIAGNOSIS — F329 Major depressive disorder, single episode, unspecified: Secondary | ICD-10-CM

## 2014-10-26 DIAGNOSIS — G92 Toxic encephalopathy: Secondary | ICD-10-CM

## 2014-10-26 DIAGNOSIS — I952 Hypotension due to drugs: Secondary | ICD-10-CM

## 2014-10-26 DIAGNOSIS — T40601A Poisoning by unspecified narcotics, accidental (unintentional), initial encounter: Secondary | ICD-10-CM

## 2014-10-26 DIAGNOSIS — T40601S Poisoning by unspecified narcotics, accidental (unintentional), sequela: Secondary | ICD-10-CM

## 2014-10-26 DIAGNOSIS — A419 Sepsis, unspecified organism: Secondary | ICD-10-CM

## 2014-10-26 LAB — CBC
HEMATOCRIT: 31.2 % — AB (ref 36.0–46.0)
Hemoglobin: 9.9 g/dL — ABNORMAL LOW (ref 12.0–15.0)
MCH: 27.9 pg (ref 26.0–34.0)
MCHC: 31.7 g/dL (ref 30.0–36.0)
MCV: 87.9 fL (ref 78.0–100.0)
PLATELETS: 342 10*3/uL (ref 150–400)
RBC: 3.55 MIL/uL — ABNORMAL LOW (ref 3.87–5.11)
RDW: 15.3 % (ref 11.5–15.5)
WBC: 6.3 10*3/uL (ref 4.0–10.5)

## 2014-10-26 LAB — BASIC METABOLIC PANEL
Anion gap: 6 (ref 5–15)
BUN: 8 mg/dL (ref 6–20)
CHLORIDE: 110 mmol/L (ref 101–111)
CO2: 23 mmol/L (ref 22–32)
CREATININE: 0.37 mg/dL — AB (ref 0.44–1.00)
Calcium: 8.9 mg/dL (ref 8.9–10.3)
GFR calc Af Amer: >60 mL/min (ref >60)
GFR calc non Af Amer: >60 mL/min (ref >60)
Glucose, Bld: 92 mg/dL (ref 65–99)
POTASSIUM: 3.4 mmol/L — AB (ref 3.5–5.1)
Sodium: 139 mmol/L (ref 135–145)

## 2014-10-26 MED ORDER — HEPARIN SOD (PORK) LOCK FLUSH 100 UNIT/ML IV SOLN
500.0000 [IU] | INTRAVENOUS | Status: DC | PRN
  Administered 2014-10-26: 500 [IU]
  Filled 2014-10-26 (×2): qty 5

## 2014-10-26 MED ORDER — SULFAMETHOXAZOLE-TRIMETHOPRIM 800-160 MG PO TABS: 1.0000 | ORAL_TABLET | Freq: Two times a day (BID) | ORAL | Status: AC

## 2014-10-26 MED ORDER — HEPARIN SOD (PORK) LOCK FLUSH 100 UNIT/ML IV SOLN
500.0000 [IU] | INTRAVENOUS | Status: DC
  Filled 2014-10-26: qty 5

## 2014-10-26 MED ORDER — OXYCODONE HCL 5 MG PO TABS: 5.0000 mg | ORAL_TABLET | Freq: Four times a day (QID) | ORAL | Status: DC | PRN

## 2014-10-26 MED ORDER — BOOST PLUS PO LIQD: 237.0000 mL | Freq: Three times a day (TID) | ORAL | Status: DC | PRN

## 2014-10-26 MED ORDER — PRO-STAT SUGAR FREE PO LIQD: 30.0000 mL | Freq: Two times a day (BID) | ORAL | Status: DC

## 2014-10-26 MED ORDER — POTASSIUM CHLORIDE CRYS ER 20 MEQ PO TBCR
40.0000 meq | EXTENDED_RELEASE_TABLET | Freq: Once | ORAL | Status: AC
  Administered 2014-10-26: 40 meq via ORAL
  Filled 2014-10-26: qty 2

## 2014-10-26 NOTE — Progress Notes (Signed)
Completed D/C teaching with patient. Answered all questions. Patient will be discharged home via [facility] with home health [facility]).

## 2014-10-26 NOTE — Discharge Summary (Addendum)
Physician Discharge Summary  Loanne Emery HWT:[id number] DOB: 17-Jan-1971 DOA: 10/22/2014  PCP: patient informs seeing Dr Shelton Silvas with [facility]  Admit date: 10/22/2014 Discharge date: 10/26/2014  Time spent: 35 minutes  Recommendations for Outpatient Follow-up:  #1 Discharge home with outpatient PCP follow-up. Patient will complete a total two-week course of Bactrim on 11/03/2014 #2 I have not prescribed her any narcotics given history of narcotic abuse and recurrent hospitalization for narcotic overdose.  Discharge Diagnoses:  Principal problem Septic shock  Active Problems:   Toxic metabolic encephalopathy   Overdose of opiate or related narcotic   Protein-calorie malnutrition, severe (HCC)   Depression   Quadriplegia, C5-C7 incomplete (HCC)   Severe sepsis (HCC)   Leukocytosis   Hypokalemia   Pressure ulcer of sacral region, stage 4 (HCC)   Anemia, iron deficiency   Urinary tract infection due to Proteus   Discharge Condition: Fair  Diet recommendation: Regular with supplements  Filed Weights   10/22/14 1731 10/22/14 2245  Weight: 46.72 kg (103 lb) 42.4 kg (93 lb 7.6 oz)    History of present illness:  Please refer to admission H&P for details, in brief, 44 year old female with C5 quadriplegia since a fall in 2014, narcotic abuse, anemia, depression, protein calorie malnutrition, presented to the ED with acute encephalopathy. Patient received 2 doses of Narcan and her mental status improved.  she was found to be in septic shock secondary to Proteus UTI. Patient placed on empiric antibiotics and admitted to stepdown unit.  Hospital Course:  Principal problem Septic shock secondary to Proteus UTI. Admitted to stepdown with aggressive IV resuscitation. BP stable. cx mostly sensitive. Narrowed abx to bactrim. Blood cx without any growth. Patient remains afebrile and leukocytosis. Will discharge her home on oral Bactrim to complete a total 10 day course  of antibiotics. Patient has chronic Foley catheter.. For reason unclear patient is also on chronic doxycycline.   Active problems: Acute encephalopathy/opiate overdose Likely from narcotic overdose and sepsis. Patient has been hospitalized 6 weeks back for similar issues. Appears to have ongoing issue of opiate overdose. Patient is also on anti-depressions. Required Narcan on admission. Mental status now returned to baseline. Will not prescribe any further opiates until she sees her PCP.  Hypokalemia Replenish  Protein calorie malnutrition Continue feeding supplements  Depression Continue Cymbalta and Celexa.  iron deficiency anemia Continue iron supplements.  Stage 4 sacral pressure ulcers noninfected. Appreciate wound care recommendations. Ordered home health RN for wound care.  Chronic pain Patient is on baclofen, when necessary Motrin and oxycodone for pain. Patient asking for prescription for oxycodone until she sees her PCP. Given her history of narcotic overdose. Current hospitalization high have not given her any prescription. She should follow-up with her PCP.  Code Status: full code Family Communication: Ex-husband at bedside Disposition Plan: home with outpatient follow-up   Consultants:  none  Procedures:  none  Antibiotics:  Vanco, levaquin and aztreonam 10/3-10/5  Bactrim 10/5--- until 10/14  Discharge Exam: Filed Vitals:   10/26/14 0538  BP: 111/70  Pulse: 94  Temp: 98.2 F (36.8 C)  Resp: 20     General: middle aged thin built female in no acute distress  HEENT: Trach site clean, no pallor, moist oral mucosa  Chest: Clear to auscultation bilaterally  CVS: Normal S1 and S2, no murmurs rub or gallop  GI: Soft, nondistended, nontender, bowel sounds present  Musculoskeletal : Warm, no edema  CNS: Alert and oriented  Discharge Instructions    Current Discharge  Medication List    START taking these medications   Details   Amino Acids-Protein Hydrolys (FEEDING SUPPLEMENT, PRO-STAT SUGAR FREE 64,) LIQD Take 30 mLs by mouth 2 (two) times daily. Qty: 900 mL, Refills: 0     lactose free nutrition (BOOST PLUS) LIQD Take 237 mLs by mouth 3 (three) times daily as needed (PRN). Qty: 60 Can, Refills: 0    sulfamethoxazole-trimethoprim (BACTRIM DS,SEPTRA DS) 800-160 MG tablet Take 1 tablet by mouth every 12 (twelve) hours. Qty: 24 tablet, Refills: 0        CONTINUE these medications which have NOT CHANGED   Details  acetaminophen (TYLENOL) 325 MG tablet Take 2 tablets (650 mg total) by mouth every 6 (six) hours as needed for fever. Qty: 30 tablet, Refills: 0    albuterol (PROVENTIL HFA;VENTOLIN HFA) 108 (90 BASE) MCG/ACT inhaler Inhale 2 puffs into the lungs every 6 (six) hours as needed. For shortness of breath      baclofen (LIORESAL) 10 MG tablet Take 1 tablet by mouth at bedtime. Along with Baclofen 20mg  for a total of 30 mgs at night Refills: 6     baclofen (LIORESAL) 20 MG tablet Take 20 mg by mouth 3 (three) times daily. Take 20 mg in the morning and afternoon and then take 20mg  + a 10mg  tablet at night for a total of 30mg  at night Refills: 5    citalopram (CELEXA) 20 MG tablet Take 20 mg by mouth daily. Refills: 3    collagenase (SANTYL) ointment Apply 1 application topically daily as needed (wounds).     CVS VITAMIN B12 1000 MCG tablet Take 1 tablet by mouth daily. Refills: 3    doxycycline (VIBRA-TABS) 100 MG tablet Take 100 mg by mouth every 12 (twelve) hours. continous Refills: 3    DULoxetine (CYMBALTA) 20 MG capsule Take 20 mg by mouth 2 (two) times daily. Refills: 3    feeding supplement, ENSURE ENLIVE, (ENSURE ENLIVE) LIQD Take 237 mLs by mouth 2 (two) times daily between meals. Qty: 237 mL, Refills: 12    ferrous sulfate 325 (65 FE) MG tablet Take 1 tablet (325 mg total) by mouth 3 (three) times daily. Qty: 90 tablet, Refills: 0    gabapentin (NEURONTIN) 400 MG capsule Take 400 mg  by mouth 3 (three) times daily. Refills: 5    ibuprofen (ADVIL,MOTRIN) 200 MG tablet Take 400 mg by mouth every 6 (six) hours as needed for moderate pain.    zinc oxide (BALMEX) 11.3 % CREA cream Apply 1 application topically 3 (three) times daily as needed (for skin redness/irritation).    zolpidem (AMBIEN) 5 MG tablet Take 1 tablet (5 mg total) by mouth at bedtime as needed for sleep. Qty: 10 tablet, Refills: 0        STOP taking these medications     oxyCODONE (OXY IR/ROXICODONE) 5 MG immediate release tablet      ciprofloxacin (CIPRO) 500 MG tablet      omeprazole (PRILOSEC) 20 MG capsule      oxyCODONE (ROXICODONE) 15 MG immediate release tablet        Allergies  Allergen Reactions  . Erythromycin Nausea And Vomiting  . Nitrofurantoin Monohyd Macro Nausea And Vomiting  . Penicillins Hives    Has patient had a PCN reaction causing immediate rash, facial/tongue/throat swelling, SOB or lightheadedness with hypotension: Yes Has patient had a PCN reaction causing severe rash involving mucus membranes or skin necrosis: Yes Has patient had a PCN reaction that required hospitalization Yes- went  to the DR Has patient had a PCN reaction occurring within the last 10 years: No-childhood allergy If all of the above answers are "NO", then may proceed with Cephalosporin use.    Follow-up Information    Please follow up.   Why:  pcp ( Dr Shelton Silvas in 1 week)       The results of significant diagnostics from this hospitalization (including imaging, microbiology, ancillary and laboratory) are listed below for reference.    Significant Diagnostic Studies: Dg Chest Port 1 View  10/22/2014   CLINICAL DATA:  Weakness, shortness of breath  EXAM: PORTABLE CHEST 1 VIEW  COMPARISON:  09/03/2014  FINDINGS: There is no focal parenchymal opacity. There is no pleural effusion or pneumothorax. The heart and mediastinal contours are unremarkable. There is a dual lead cardiac pacemaker.  There is  evidence of prior posterior cervical fusion.  IMPRESSION: No active disease.   Electronically Signed   By: Kathreen Devoid   On: 10/22/2014 18:36    Microbiology: Recent Results (from the past 240 hour(s))  Blood Culture (routine x 2)     Status: None (Preliminary result)   Collection Time: 10/22/14  4:52 PM  Result Value Ref Range Status   Specimen Description BLOOD RIGHT ARM  5 ML IN [facility] BOTTLE  Final   Special Requests NONE  Final   Culture   Final    NO GROWTH 2 DAYS Performed at [facility]    Report Status PENDING  Incomplete  Urine culture     Status: None   Collection Time: 10/22/14  6:30 PM  Result Value Ref Range Status   Specimen Description URINE, CLEAN CATCH  Final   Special Requests NONE  Final   Culture   Final    >=100,000 COLONIES/mL PROTEUS MIRABILIS Performed at [facility]    Report Status 10/25/2014 FINAL  Final   Organism ID, Bacteria PROTEUS MIRABILIS  Final      Susceptibility   Proteus mirabilis - MIC*    AMPICILLIN <=2 SENSITIVE Sensitive     CEFAZOLIN 8 SENSITIVE Sensitive     CEFTRIAXONE <=1 SENSITIVE Sensitive     CIPROFLOXACIN 2 INTERMEDIATE Intermediate     GENTAMICIN <=1 SENSITIVE Sensitive     IMIPENEM 8 INTERMEDIATE Intermediate     NITROFURANTOIN 128 RESISTANT Resistant     TRIMETH/SULFA <=20 SENSITIVE Sensitive     AMPICILLIN/SULBACTAM 4 SENSITIVE Sensitive     PIP/TAZO <=4 SENSITIVE Sensitive     * >=100,000 COLONIES/mL PROTEUS MIRABILIS  Blood Culture (routine x 2)     Status: None (Preliminary result)   Collection Time: 10/22/14  7:45 PM  Result Value Ref Range Status   Specimen Description BLOOD FEMORAL ARTERY  Final   Special Requests BOTTLES DRAWN AEROBIC AND ANAEROBIC 5ML  Final   Culture   Final    NO GROWTH 2 DAYS Performed at [facility]    Report Status PENDING  Incomplete  MRSA PCR Screening     Status: None   Collection Time: 10/23/14 12:59 AM  Result Value Ref Range Status   MRSA by PCR  NEGATIVE NEGATIVE Final    Comment:        The GeneXpert MRSA Assay (FDA approved for NASAL specimens only), is one component of a comprehensive MRSA colonization surveillance program. It is not intended to diagnose MRSA infection nor to guide or monitor treatment for MRSA infections.      Labs: Basic Metabolic Panel:  Recent Labs Lab 10/22/14  1800 10/23/14 0600 10/25/14 0405 10/26/14 0534  NA 138 138 138 139  K 3.9 3.4* 3.2* 3.4*  CL 109 113* 109 110  CO2 24 21* 24 23  GLUCOSE 96 77 76 92  BUN 16 13 9 8   CREATININE 0.77 0.41* 0.33* 0.37*  CALCIUM 8.5* 8.1* 8.7* 8.9   Liver Function Tests:  Recent Labs Lab 10/22/14 1800  AST 18  ALT 10*  ALKPHOS 92  BILITOT 0.5  PROT 5.6*  ALBUMIN 2.0*   No results for input(s): LIPASE, AMYLASE in the last 168 hours. No results for input(s): AMMONIA in the last 168 hours. CBC:  Recent Labs Lab 10/22/14 1800 10/23/14 0600 10/25/14 0405 10/26/14 0534  WBC 12.5* 8.5 6.1 6.3  NEUTROABS 10.3*  --   --   --   HGB 10.8* 9.3* 8.9* 9.9*  HCT 34.5* 29.6* 28.1* 31.2*  MCV 89.1 87.8 87.0 87.9  PLT 388 317 316 342   Cardiac Enzymes:  Recent Labs Lab 10/22/14 1800  TROPONINI <0.03   BNP: BNP (last 3 results) No results for input(s): BNP in the last 8760 hours.  ProBNP (last 3 results) No results for input(s): PROBNP in the last 8760 hours.  CBG: No results for input(s): GLUCAP in the last 168 hours.     SignedLouellen Molder  [facility] 10/26/2014, 10:45 AM

## 2014-10-28 LAB — CULTURE, BLOOD (ROUTINE X 2)
Culture: NO GROWTH
Culture: NO GROWTH

## 2014-10-29 ENCOUNTER — Observation Stay (HOSPITAL_COMMUNITY)
Admission: EM | Admit: 2014-10-29 | Discharge: 2014-10-30 | Disposition: A | Discharge status: Home / Self Care | Payer: Medicaid Other | Attending: Internal Medicine | Admitting: Internal Medicine

## 2014-10-29 ENCOUNTER — Emergency Department (HOSPITAL_COMMUNITY): Payer: Medicaid Other

## 2014-10-29 ENCOUNTER — Encounter (HOSPITAL_COMMUNITY): Payer: Self-pay | Admitting: Emergency Medicine

## 2014-10-29 DIAGNOSIS — Z950 Presence of cardiac pacemaker: Secondary | ICD-10-CM | POA: Diagnosis not present

## 2014-10-29 DIAGNOSIS — R531 Weakness: Secondary | ICD-10-CM | POA: Diagnosis present

## 2014-10-29 DIAGNOSIS — E43 Unspecified severe protein-calorie malnutrition: Secondary | ICD-10-CM

## 2014-10-29 DIAGNOSIS — Z720 Tobacco use: Secondary | ICD-10-CM | POA: Diagnosis not present

## 2014-10-29 DIAGNOSIS — R404 Transient alteration of awareness: Secondary | ICD-10-CM | POA: Diagnosis not present

## 2014-10-29 DIAGNOSIS — L89154 Pressure ulcer of sacral region, stage 4: Secondary | ICD-10-CM

## 2014-10-29 DIAGNOSIS — D509 Iron deficiency anemia, unspecified: Secondary | ICD-10-CM | POA: Diagnosis not present

## 2014-10-29 DIAGNOSIS — Z880 Allergy status to penicillin: Secondary | ICD-10-CM | POA: Insufficient documentation

## 2014-10-29 DIAGNOSIS — J45909 Unspecified asthma, uncomplicated: Secondary | ICD-10-CM | POA: Insufficient documentation

## 2014-10-29 DIAGNOSIS — N390 Urinary tract infection, site not specified: Secondary | ICD-10-CM

## 2014-10-29 DIAGNOSIS — G9341 Metabolic encephalopathy: Secondary | ICD-10-CM | POA: Diagnosis present

## 2014-10-29 DIAGNOSIS — K219 Gastro-esophageal reflux disease without esophagitis: Secondary | ICD-10-CM | POA: Insufficient documentation

## 2014-10-29 DIAGNOSIS — G8250 Quadriplegia, unspecified: Secondary | ICD-10-CM | POA: Diagnosis present

## 2014-10-29 DIAGNOSIS — Z79899 Other long term (current) drug therapy: Secondary | ICD-10-CM | POA: Insufficient documentation

## 2014-10-29 DIAGNOSIS — G929 Unspecified toxic encephalopathy: Secondary | ICD-10-CM

## 2014-10-29 DIAGNOSIS — G92 Toxic encephalopathy: Secondary | ICD-10-CM

## 2014-10-29 DIAGNOSIS — G8254 Quadriplegia, C5-C7 incomplete: Secondary | ICD-10-CM

## 2014-10-29 DIAGNOSIS — B964 Proteus (mirabilis) (morganii) as the cause of diseases classified elsewhere: Secondary | ICD-10-CM | POA: Diagnosis present

## 2014-10-29 DIAGNOSIS — R4182 Altered mental status, unspecified: Secondary | ICD-10-CM | POA: Diagnosis present

## 2014-10-29 LAB — CBC WITH DIFFERENTIAL/PLATELET
BASOS PCT: 0 %
Basophils Absolute: 0 10*3/uL (ref 0.0–0.1)
EOS ABS: 0.1 10*3/uL (ref 0.0–0.7)
EOS PCT: 1 %
HCT: 34.3 % — ABNORMAL LOW (ref 36.0–46.0)
HEMOGLOBIN: 11 g/dL — AB (ref 12.0–15.0)
Lymphocytes Relative: 15 %
Lymphs Abs: 1.6 10*3/uL (ref 0.7–4.0)
MCH: 28 pg (ref 26.0–34.0)
MCHC: 32.1 g/dL (ref 30.0–36.0)
MCV: 87.3 fL (ref 78.0–100.0)
MONOS PCT: 4 %
Monocytes Absolute: 0.4 10*3/uL (ref 0.1–1.0)
NEUTROS PCT: 80 %
Neutro Abs: 8.2 10*3/uL — ABNORMAL HIGH (ref 1.7–7.7)
PLATELETS: 399 10*3/uL (ref 150–400)
RBC: 3.93 MIL/uL (ref 3.87–5.11)
RDW: 15.4 % (ref 11.5–15.5)
WBC: 10.3 10*3/uL (ref 4.0–10.5)

## 2014-10-29 LAB — URINALYSIS, ROUTINE W REFLEX MICROSCOPIC
BILIRUBIN URINE: NEGATIVE
Glucose, UA: NEGATIVE mg/dL
KETONES UR: NEGATIVE mg/dL
NITRITE: NEGATIVE
PROTEIN: 30 mg/dL — AB
Specific Gravity, Urine: 1.012 (ref 1.005–1.030)
Urobilinogen, UA: 0.2 mg/dL (ref 0.0–1.0)
pH: 6 (ref 5.0–8.0)

## 2014-10-29 LAB — URINE MICROSCOPIC-ADD ON

## 2014-10-29 LAB — COMPREHENSIVE METABOLIC PANEL
ALBUMIN: 2.4 g/dL — AB (ref 3.5–5.0)
ALK PHOS: 85 U/L (ref 38–126)
ALT: 9 U/L — ABNORMAL LOW (ref 14–54)
ANION GAP: 7 (ref 5–15)
AST: 12 U/L — ABNORMAL LOW (ref 15–41)
BUN: 8 mg/dL (ref 6–20)
CALCIUM: 9 mg/dL (ref 8.9–10.3)
CHLORIDE: 107 mmol/L (ref 101–111)
CO2: 25 mmol/L (ref 22–32)
Creatinine, Ser: 0.32 mg/dL — ABNORMAL LOW (ref 0.44–1.00)
GFR calc non Af Amer: >60 mL/min (ref >60)
GLUCOSE: 73 mg/dL (ref 65–99)
Potassium: 4.1 mmol/L (ref 3.5–5.1)
SODIUM: 139 mmol/L (ref 135–145)
Total Bilirubin: 0.3 mg/dL (ref 0.3–1.2)
Total Protein: 6 g/dL — ABNORMAL LOW (ref 6.5–8.1)

## 2014-10-29 LAB — RAPID URINE DRUG SCREEN, HOSP PERFORMED
Amphetamines: NOT DETECTED
BENZODIAZEPINES: NOT DETECTED
Barbiturates: NOT DETECTED
Cocaine: NOT DETECTED
Opiates: NOT DETECTED
Tetrahydrocannabinol: NOT DETECTED

## 2014-10-29 LAB — POC URINE PREG, ED: Preg Test, Ur: NEGATIVE

## 2014-10-29 LAB — I-STAT CG4 LACTIC ACID, ED: LACTIC ACID, VENOUS: 0.94 mmol/L (ref 0.5–2.0)

## 2014-10-29 MED ORDER — DEXTROSE 5 % IV SOLN
1.0000 g | Freq: Once | INTRAVENOUS | Status: AC
  Administered 2014-10-29: 1 g via INTRAVENOUS
  Filled 2014-10-29: qty 10

## 2014-10-29 MED ORDER — SODIUM CHLORIDE 0.9 % IV BOLUS (SEPSIS)
1000.0000 mL | Freq: Once | INTRAVENOUS | Status: AC
  Administered 2014-10-29: 1000 mL via INTRAVENOUS

## 2014-10-29 MED ORDER — SULFAMETHOXAZOLE-TRIMETHOPRIM 800-160 MG PO TABS
1.0000 | ORAL_TABLET | Freq: Two times a day (BID) | ORAL | Status: DC
Start: 2014-10-29 — End: 2014-10-30
  Administered 2014-10-29 – 2014-10-30 (×2): 1 via ORAL
  Filled 2014-10-29 (×2): qty 1

## 2014-10-29 MED ORDER — GABAPENTIN 400 MG PO CAPS
400.0000 mg | ORAL_CAPSULE | Freq: Three times a day (TID) | ORAL | Status: DC
  Administered 2014-10-29 – 2014-10-30 (×2): 400 mg via ORAL
  Filled 2014-10-29 (×2): qty 1

## 2014-10-29 MED ORDER — ENOXAPARIN SODIUM 40 MG/0.4ML ~~LOC~~ SOLN: 40.0000 mg | SUBCUTANEOUS | Status: DC

## 2014-10-29 MED ORDER — FLUCONAZOLE IN SODIUM CHLORIDE 200-0.9 MG/100ML-% IV SOLN
200.0000 mg | INTRAVENOUS | Status: DC
  Administered 2014-10-29: 200 mg via INTRAVENOUS
  Filled 2014-10-29: qty 100

## 2014-10-29 MED ORDER — ENOXAPARIN SODIUM 30 MG/0.3ML ~~LOC~~ SOLN
30.0000 mg | Freq: Every day | SUBCUTANEOUS | Status: DC
  Administered 2014-10-29: 30 mg via SUBCUTANEOUS
  Filled 2014-10-29: qty 0.3

## 2014-10-29 MED ORDER — VITAMIN B-12 1000 MCG PO TABS
1000.0000 ug | ORAL_TABLET | Freq: Every day | ORAL | Status: DC
  Administered 2014-10-29 – 2014-10-30 (×2): 1000 ug via ORAL
  Filled 2014-10-29 (×2): qty 1

## 2014-10-29 MED ORDER — ALBUTEROL SULFATE (2.5 MG/3ML) 0.083% IN NEBU: 3.0000 mL | INHALATION_SOLUTION | Freq: Four times a day (QID) | RESPIRATORY_TRACT | Status: DC | PRN

## 2014-10-29 MED ORDER — DULOXETINE HCL 20 MG PO CPEP
20.0000 mg | ORAL_CAPSULE | Freq: Two times a day (BID) | ORAL | Status: DC
  Administered 2014-10-29 – 2014-10-30 (×2): 20 mg via ORAL
  Filled 2014-10-29 (×3): qty 1

## 2014-10-29 MED ORDER — CITALOPRAM HYDROBROMIDE 20 MG PO TABS
20.0000 mg | ORAL_TABLET | Freq: Every day | ORAL | Status: DC
  Administered 2014-10-30: 20 mg via ORAL
  Filled 2014-10-29: qty 1

## 2014-10-29 MED ORDER — ACETAMINOPHEN 325 MG PO TABS: 650.0000 mg | ORAL_TABLET | Freq: Four times a day (QID) | ORAL | Status: DC | PRN

## 2014-10-29 MED ORDER — FERROUS SULFATE 325 (65 FE) MG PO TABS
325.0000 mg | ORAL_TABLET | Freq: Three times a day (TID) | ORAL | Status: DC
  Administered 2014-10-29 – 2014-10-30 (×2): 325 mg via ORAL
  Filled 2014-10-29 (×2): qty 1

## 2014-10-29 MED ORDER — BOOST PLUS PO LIQD
237.0000 mL | Freq: Three times a day (TID) | ORAL | Status: DC | PRN
  Filled 2014-10-29: qty 237

## 2014-10-29 MED ORDER — COLLAGENASE 250 UNIT/GM EX OINT
1.0000 "application " | TOPICAL_OINTMENT | Freq: Every day | CUTANEOUS | Status: DC | PRN
  Filled 2014-10-29: qty 30

## 2014-10-29 MED ORDER — PROMETHAZINE HCL 25 MG PO TABS: 12.5000 mg | ORAL_TABLET | Freq: Four times a day (QID) | ORAL | Status: DC | PRN

## 2014-10-29 MED ORDER — ZINC OXIDE 11.3 % EX CREA
1.0000 "application " | TOPICAL_CREAM | Freq: Three times a day (TID) | CUTANEOUS | Status: DC | PRN
  Filled 2014-10-29: qty 56

## 2014-10-29 MED ORDER — DOXYCYCLINE HYCLATE 100 MG PO TABS
100.0000 mg | ORAL_TABLET | Freq: Two times a day (BID) | ORAL | Status: DC
  Administered 2014-10-29 – 2014-10-30 (×2): 100 mg via ORAL
  Filled 2014-10-29 (×2): qty 1

## 2014-10-29 MED ORDER — IBUPROFEN 200 MG PO TABS
400.0000 mg | ORAL_TABLET | Freq: Four times a day (QID) | ORAL | Status: DC | PRN
  Administered 2014-10-29 – 2014-10-30 (×2): 400 mg via ORAL
  Filled 2014-10-29 (×2): qty 2

## 2014-10-29 MED ORDER — SACCHAROMYCES BOULARDII 250 MG PO CAPS
250.0000 mg | ORAL_CAPSULE | Freq: Two times a day (BID) | ORAL | Status: DC
  Administered 2014-10-29 – 2014-10-30 (×2): 250 mg via ORAL
  Filled 2014-10-29 (×2): qty 1

## 2014-10-29 MED ORDER — ENSURE ENLIVE PO LIQD
237.0000 mL | Freq: Two times a day (BID) | ORAL | Status: DC
  Administered 2014-10-30: 237 mL via ORAL

## 2014-10-29 NOTE — H&P (Signed)
[facility] History and Physical  Angela Page YTK:[id number] DOB: 1970/07/20 DOA: 10/29/2014  Referring physician:  Gareth Morgan PCP:  No PCP Per Patient   Chief Complaint:  AMS  HPI:  The patient is a 44 y.o. year-old female with history of quadriparesis, pacemaker placement, asthma, autonomic dysfunction with hypotension, and stage 4 sacral decubitus ulcer, stage 4 left buttock crease ulcer and stage 4 left upper buttock ulcer on chronic doxycycline who presented with sleepiness.  She lives at home and is primarily bedbound, needing assistance for all ADLs.  The patient is confused and cannot answer questions and her mother who is her caretaker gave the history.  The patient has been admitted to the hospital 4 times in the last 6 months. During her other previous admissions, she has had problems with oversedation.  She was most recently admitted from 10/2 through 10/6 secondary to septic shock from Proteus urinary tract infection and narcotic overdose.  She was discharged on oral Bactrim.  At the time of admission, she required narcan and her narcotics were discontinued.  When she returned home, there was confusion about her baclofen.  She had previously been taking baclofen 20mg  qAM, 20mg  at lunch, 30mg  at bedtime.  Her dose was increased to 30mg  TID.  She has not taken any narcotics or benzos since discharge.  Her mother stated that since discharge, she has had poor appetite, eating only a few bites and barely drinking yesterday.  She has become increasingly confused and sleepy.  Her urine has become a little darker but has not been purulent.  Her wounds have had some drainage, but not particularly more than before and no new foul odor.  She has been taking the bactrim and doxycycline.  She has not had vomiting, cough, shortness of breath, low blood pressures, or fevers.    In the emergency room and, her vital signs are stable. Her white blood cell count is normal. Her kidney function is  normal. Her chest x-ray is unremarkable. Her urinalysis demonstrates too numerous to count WBCs but rare bacteria and yeast.  Urine culture was obtained. She is being admitted to observation for oversedation.     Review of Systems:  Patient confused, unable to obtain  Past Medical History  Diagnosis Date  . Asthma   . Pleurisy   . GERD (gastroesophageal reflux disease)   . Quadriparesis ([place])   . Pacemaker    Past Surgical History  Procedure Laterality Date  . Appendectomy    . Cardiac surgery    . I&d extremity  10/28/2011    Procedure: IRRIGATION AND DEBRIDEMENT EXTREMITY;  Surgeon: Tennis Must, MD;  Location: [place];  Service: Orthopedics;  Laterality: Right;  Irrigation and debridement right middle finger   Social History:  reports that she has been smoking Cigarettes.  She has a 25 pack-year smoking history. She does not have any smokeless tobacco history on file. She reports that she drinks alcohol. She reports that she does not use illicit drugs.  Allergies  Allergen Reactions  . Erythromycin Nausea And Vomiting  . Nitrofurantoin Monohyd Macro Nausea And Vomiting  . Penicillins Hives    Has patient had a PCN reaction causing immediate rash, facial/tongue/throat swelling, SOB or lightheadedness with hypotension: Yes Has patient had a PCN reaction causing severe rash involving mucus membranes or skin necrosis: Yes Has patient had a PCN reaction that required hospitalization Yes- went to the DR Has patient had a PCN reaction occurring within the last 10  years: No-childhood allergy If all of the above answers are "NO", then may proceed with Cephalosporin use.     Family History  Problem Relation Age of Onset  . Hypertension Maternal Uncle      Prior to Admission medications   Medication Sig Start Date End Date Taking? Authorizing Provider  acetaminophen (TYLENOL) 325 MG tablet Take 2 tablets (650 mg total) by mouth every 6 (six) hours as needed for  fever. 09/06/14  Yes Robbie Lis, MD  albuterol (PROVENTIL HFA;VENTOLIN HFA) 108 (90 BASE) MCG/ACT inhaler Inhale 2 puffs into the lungs every 6 (six) hours as needed. For shortness of breath    Yes Historical Provider, MD  baclofen (LIORESAL) 10 MG tablet Take 10 mg by mouth 3 (three) times daily. Along with Baclofen 20mg  for a total of 30 mgs everyday 10/05/14  Yes Historical Provider, MD  baclofen (LIORESAL) 20 MG tablet Take 20 mg by mouth 3 (three) times daily. Take 30mg  everyday. Takes along with the 10mg  tablet. 07/04/14  Yes Historical Provider, MD  citalopram (CELEXA) 20 MG tablet Take 20 mg by mouth daily with breakfast.  07/04/14  Yes Historical Provider, MD  collagenase (SANTYL) ointment Apply 1 application topically daily as needed (wounds).    Yes Historical Provider, MD  CVS VITAMIN B12 1000 MCG tablet Take 1 tablet by mouth daily. 08/22/14  Yes Historical Provider, MD  doxycycline (VIBRA-TABS) 100 MG tablet Take 100 mg by mouth every 12 (twelve) hours. continous 10/12/14  Yes Historical Provider, MD  DULoxetine (CYMBALTA) 20 MG capsule Take 20 mg by mouth 2 (two) times daily. 07/04/14  Yes Historical Provider, MD  feeding supplement, ENSURE ENLIVE, (ENSURE ENLIVE) LIQD Take 237 mLs by mouth 2 (two) times daily between meals. 09/06/14  Yes Robbie Lis, MD  ferrous sulfate 325 (65 FE) MG tablet Take 1 tablet (325 mg total) by mouth 3 (three) times daily. 08/04/14  Yes Janece Canterbury, MD  gabapentin (NEURONTIN) 400 MG capsule Take 400 mg by mouth 3 (three) times daily. 10/18/14  Yes Historical Provider, MD  ibuprofen (ADVIL,MOTRIN) 200 MG tablet Take 400 mg by mouth every 6 (six) hours as needed for moderate pain.   Yes Historical Provider, MD  lactose free nutrition (BOOST PLUS) LIQD Take 237 mLs by mouth 3 (three) times daily as needed (PRN). 10/26/14  Yes Nishant Dhungel, MD  silver sulfADIAZINE (SILVADENE) 1 % cream Apply 1 application topically every other day as needed (for wound care).    Yes Historical Provider, MD  sulfamethoxazole-trimethoprim (BACTRIM DS,SEPTRA DS) 800-160 MG tablet Take 1 tablet by mouth every 12 (twelve) hours. Patient taking differently: Take 1 tablet by mouth every 12 (twelve) hours. Started 10/06 for 12 days 10/26/14 11/06/14 Yes Nishant Dhungel, MD  zinc oxide (BALMEX) 11.3 % CREA cream Apply 1 application topically 3 (three) times daily as needed (for skin redness/irritation).   Yes Historical Provider, MD  Amino Acids-Protein Hydrolys (FEEDING SUPPLEMENT, PRO-STAT SUGAR FREE 64,) LIQD Take 30 mLs by mouth 2 (two) times daily. Patient not taking: Reported on 10/29/2014 10/26/14   Nishant Dhungel, MD  zolpidem (AMBIEN) 5 MG tablet Take 1 tablet (5 mg total) by mouth at bedtime as needed for sleep. Patient not taking: Reported on 10/29/2014 09/06/14   Robbie Lis, MD   Physical Exam: Filed Vitals:   10/29/14 1352 10/29/14 1512 10/29/14 1536 10/29/14 1732  BP:  121/79 131/61 118/78  Pulse:  79 78 80  Temp:   97.9 F (36.6 C) 97.9  F (36.6 C)  TempSrc:   Oral Oral  Resp:   16 16  SpO2: 99% 100% 99% 98%     General:  Thin AF, NAD, confused and answering nonsensically to questions but with real words.  Face is twitching frequently and she has difficulty maintaining eye contact  Eyes:  PERRL, anicteric, non-injected.  ENT:  Nares clear.  OP clear, non-erythematous without plaques or exudates.  MMM.  Neck:  Supple without TM or JVD.    Lymph:  No cervical, supraclavicular, or submandibular LAD.  Cardiovascular:  RRR, normal S1, S2, without m/r/g.  2+ pulses, warm extremities  Respiratory:  CTA bilaterally without increased WOB.  Abdomen:  NABS.  Soft, ND/NT.    Skin:  Erythematous rash with satellite lesions on labia/perineal area.  Large stage 4 sacral decubitus ulcer with exposed bone and some overlying bleeding granulation tissue.  3cm Stage IV ulcer on upper left buttock draining thick white material without foul odor or surrounding  erythema, induration.  Another 2-3 cm stage IV ulcer on the gluteal crease also appears to be draining a small amount of white-yellow material without odor and without surrounding erythema or induration.    Musculoskeletal:  Contractures of bilateral upper extremities and hands, legs have lost muscle bulk and are flaccid.  She has a 1cm  Stage 1 pressure ulcer on the back of her left calf.  Heels look okay.  bulk and tone.  No LE edema.  Psychiatric:  Alert but falls asleep intermittently, oriented to [place], but not place, reason for being there, or date.  Occasionally speaks nonsensically.    Neurologic:  CN 3-12 intact.  Has gross movements of upper extremities, but hands are contracted.  Quadriplegic with flaccid lower extremities.    Labs on Admission:  Basic Metabolic Panel:  Recent Labs Lab 10/23/14 0600 10/25/14 0405 10/26/14 0534 10/29/14 1412  NA 138 138 139 139  K 3.4* 3.2* 3.4* 4.1  CL 113* 109 110 107  CO2 21* 24 23 25   GLUCOSE 77 76 92 73  BUN 13 9 8 8   CREATININE 0.41* 0.33* 0.37* 0.32*  CALCIUM 8.1* 8.7* 8.9 9.0   Liver Function Tests:  Recent Labs Lab 10/29/14 1412  AST 12*  ALT 9*  ALKPHOS 85  BILITOT 0.3  PROT 6.0*  ALBUMIN 2.4*   No results for input(s): LIPASE, AMYLASE in the last 168 hours. No results for input(s): AMMONIA in the last 168 hours. CBC:  Recent Labs Lab 10/23/14 0600 10/25/14 0405 10/26/14 0534 10/29/14 1412  WBC 8.5 6.1 6.3 10.3  NEUTROABS  --   --   --  8.2*  HGB 9.3* 8.9* 9.9* 11.0*  HCT 29.6* 28.1* 31.2* 34.3*  MCV 87.8 87.0 87.9 87.3  PLT 317 316 342 399   Cardiac Enzymes: No results for input(s): CKTOTAL, CKMB, CKMBINDEX, TROPONINI in the last 168 hours.  BNP (last 3 results) No results for input(s): BNP in the last 8760 hours.  ProBNP (last 3 results) No results for input(s): PROBNP in the last 8760 hours.  CBG: No results for input(s): GLUCAP in the last 168 hours.  Radiological Exams on Admission: Dg  Chest Port 1 View  10/29/2014   CLINICAL DATA:  History of asthma.  Smoker.  Admitted for UTI.  EXAM: PORTABLE CHEST 1 VIEW  COMPARISON:  10/22/2014  FINDINGS: Dual lead cardiac pacemaker is unchanged.  Cardiomediastinal silhouette is normal. Mediastinal contours appear intact.  There is no evidence of focal airspace consolidation, pleural effusion  or pneumothorax.  Osseous structures are without acute abnormality. Soft tissues are grossly normal.  IMPRESSION: No radiographic evidence of acute cardiopulmonary abnormality.   Electronically Signed   By: Fidela Salisbury M.D.   On: 10/29/2014 14:22    Assessment/Plan Principal Problem:   Toxic metabolic encephalopathy Active Problems:   Protein-calorie malnutrition, severe (HCC)   Quadriplegia, C5-C7 incomplete (HCC)   Pressure ulcer of sacral region, stage 4 (HCC)   Anemia, iron deficiency   Urinary tract infection due to Proteus   Altered mental status   Encephalopathy, toxic  ---  Drug overdose / Acute encephalopathy possibly due to baclofen, although she is also on gabapentin and antidepressants, all of which could be contributing.  History most suggests baclofen.  -  UDS -  D/c baclofen -  Continue other home medications   Previously diagnosed Proteus UTI -  Continue bactrim through the 23rd, then stop -  Add florastor  Normocytic Anemia / Iron deficiency anemia  - Continue iron supplementation  Quadriplegia - Stable, able to move upper extremities however not able to use hands.  Nonhealing, Stage IV pressure ulcers to sacrum and left ischium and upper buttock - Sacrum 6 cm x 4 cm x 3 cm  - Left ischium 4 cm x 3.3 cm x 2 cm - Dressing procedure/placement/frequency: per WOC assessment, cleanse ulcers to sacrum and left ischium with NS and pat gently dry. Apply NS moist gauze to wound bed. Cover with ABD pad and tape.Change daily.  -  Continue chronic doxycycline  -  Wound care consultation  Depression, stable -  Continue Celexa, Cymbalta   Prolonged QT, avoid QTc prolonging medications  Severe protein calorie malnutrition, continue regular diet and supplements  Diet:  regular Access:  PIV IVF:  off Proph:  lovenox  Code Status: full Family Communication: patient and her mother Disposition Plan: Admit to med surg  Time spent: 60 min Janece Canterbury [facility] Pager [phone]  If 7PM-7AM, please contact night-coverage [email or web address] Password [email or web address] 10/29/2014, 6:55 PM

## 2014-10-29 NOTE — ED Notes (Signed)
CBG 81 en route per EMS. Was seen at [facility] 1 week ago for UTI.  Change in level of consciousness per family at home.  Patient awake, alert, oriented tomperson.  Will "doze off" while talking. Foley catheter in place with cloudy urine.

## 2014-10-29 NOTE — ED Notes (Signed)
Bed: WA03 Expected date: 10/29/14 Expected time: 1:37 PM Means of arrival: Ambulance Comments: ? UTI

## 2014-10-29 NOTE — Discharge Instructions (Signed)

## 2014-10-29 NOTE — Progress Notes (Signed)
Rx Brief Lovenox not e Rx adjusted Lovenox to 30mg  daily in pt with wt=42 kg Thanks Dorrene German 10/29/2014 7:10 PM

## 2014-10-29 NOTE — ED Provider Notes (Signed)
CSN: [id number]     Arrival date & time 10/29/14  1342 History   First MD Initiated Contact with Patient 10/29/14 1350     Chief Complaint  Patient presents with  . Urinary Tract Infection     (Consider location/radiation/quality/duration/timing/severity/associated sxs/prior Treatment) Patient is a 44 y.o. female presenting with urinary tract infection. The history is provided by the patient.  Urinary Tract Infection Pain quality:  Aching Pain severity:  Moderate Onset quality:  Sudden Duration:  2 days Timing:  Constant Chronicity:  New Recent urinary tract infections: yes   Relieved by:  Nothing Worsened by:  Nothing tried Ineffective treatments:  None tried Urinary symptoms: discolored urine   Associated symptoms: no fever, no nausea and no vomiting    44 yo F with a chief complaint of a possible urinary tract infection. Family says she's been a little more tired today than normal. Patient is a quadriplegic with a indwelling Foley chronically. He also feel like it is changed color slightly.  Past Medical History  Diagnosis Date  . Asthma   . Pleurisy   . GERD (gastroesophageal reflux disease)   . Quadriparesis ([place])   . Pacemaker    Past Surgical History  Procedure Laterality Date  . Appendectomy    . Cardiac surgery    . I&d extremity  10/28/2011    Procedure: IRRIGATION AND DEBRIDEMENT EXTREMITY;  Surgeon: Tennis Must, MD;  Location: [place];  Service: Orthopedics;  Laterality: Right;  Irrigation and debridement right middle finger   Family History  Problem Relation Age of Onset  . Hypertension Maternal Uncle    Social History  Substance Use Topics  . Smoking status: Current Every Day Smoker -- 1.00 packs/day for 25 years    Types: Cigarettes  . Smokeless tobacco: None  . Alcohol Use: Yes     Comment: 2 x weekly   OB History    No data available     Review of Systems  Constitutional: Negative for fever and chills.  HENT: Negative for  congestion and rhinorrhea.   Eyes: Negative for redness and visual disturbance.  Respiratory: Negative for shortness of breath and wheezing.   Cardiovascular: Negative for chest pain and palpitations.  Gastrointestinal: Negative for nausea and vomiting.  Genitourinary: Negative for dysuria and urgency.  Musculoskeletal: Negative for myalgias and arthralgias.  Skin: Negative for pallor and wound.  Neurological: Negative for dizziness and headaches.      Allergies  Erythromycin; Nitrofurantoin monohyd macro; and Penicillins  Home Medications   Prior to Admission medications   Medication Sig Start Date End Date Taking? Authorizing Provider  acetaminophen (TYLENOL) 325 MG tablet Take 2 tablets (650 mg total) by mouth every 6 (six) hours as needed for fever. 09/06/14  Yes Robbie Lis, MD  albuterol (PROVENTIL HFA;VENTOLIN HFA) 108 (90 BASE) MCG/ACT inhaler Inhale 2 puffs into the lungs every 6 (six) hours as needed. For shortness of breath    Yes Historical Provider, MD  citalopram (CELEXA) 20 MG tablet Take 20 mg by mouth daily with breakfast.  07/04/14  Yes Historical Provider, MD  collagenase (SANTYL) ointment Apply 1 application topically daily as needed (wounds).    Yes Historical Provider, MD  [employer] VITAMIN B12 1000 MCG tablet Take 1 tablet by mouth daily. 08/22/14  Yes Historical Provider, MD  doxycycline (VIBRA-TABS) 100 MG tablet Take 100 mg by mouth every 12 (twelve) hours. continous 10/12/14  Yes Historical Provider, MD  DULoxetine (CYMBALTA) 20 MG capsule Take  20 mg by mouth 2 (two) times daily. 07/04/14  Yes Historical Provider, MD  feeding supplement, ENSURE ENLIVE, (ENSURE ENLIVE) LIQD Take 237 mLs by mouth 2 (two) times daily between meals. 09/06/14  Yes Robbie Lis, MD  ferrous sulfate 325 (65 FE) MG tablet Take 1 tablet (325 mg total) by mouth 3 (three) times daily. 08/04/14  Yes Janece Canterbury, MD  gabapentin (NEURONTIN) 400 MG capsule Take 400 mg by mouth 3 (three) times daily.  10/18/14  Yes Historical Provider, MD  ibuprofen (ADVIL,MOTRIN) 200 MG tablet Take 400 mg by mouth every 6 (six) hours as needed for moderate pain.   Yes Historical Provider, MD  lactose free nutrition (BOOST PLUS) LIQD Take 237 mLs by mouth 3 (three) times daily as needed (PRN). 10/26/14  Yes Nishant Dhungel, MD  silver sulfADIAZINE (SILVADENE) 1 % cream Apply 1 application topically every other day as needed (for wound care).   Yes Historical Provider, MD  sulfamethoxazole-trimethoprim (BACTRIM DS,SEPTRA DS) 800-160 MG tablet Take 1 tablet by mouth every 12 (twelve) hours. Patient taking differently: Take 1 tablet by mouth every 12 (twelve) hours. Started 10/06 for 12 days 10/26/14 11/06/14 Yes Nishant Dhungel, MD  zinc oxide (BALMEX) 11.3 % CREA cream Apply 1 application topically 3 (three) times daily as needed (for skin redness/irritation).   Yes Historical Provider, MD  baclofen (LIORESAL) 10 MG tablet Take 1 tablet (10 mg total) by mouth 3 (three) times daily. 10/30/14   Janece Canterbury, MD  saccharomyces boulardii (FLORASTOR) 250 MG capsule Take 1 capsule (250 mg total) by mouth 2 (two) times daily. 10/30/14   Janece Canterbury, MD   BP 114/59 mmHg  Pulse 80  Temp(Src) 97.9 F (36.6 C) (Oral)  Resp 18  Ht 5\' 2"  (1.575 m)  Wt 93 lb 11.1 oz (42.5 kg)  BMI 17.13 kg/m2  SpO2 96% Physical Exam  Constitutional: She is oriented to person, place, and time. She appears well-developed and well-nourished. No distress.  HENT:  Head: Normocephalic and atraumatic.  Eyes: EOM are normal. Pupils are equal, round, and reactive to light.  Neck: Normal range of motion. Neck supple.  Cardiovascular: Normal rate and regular rhythm.  Exam reveals no gallop and no friction rub.   No murmur heard. Pulmonary/Chest: Effort normal. She has no wheezes. She has no rales.  Abdominal: Soft. She exhibits no distension. There is no tenderness. There is no rebound.  Musculoskeletal: She exhibits no edema or  tenderness.  Neurological: She is alert and oriented to person, place, and time.  Skin: Skin is warm and dry. She is not diaphoretic.  Psychiatric: She has a normal mood and affect. Her behavior is normal.    ED Course  Procedures (including critical care time) Labs Review Labs Reviewed  URINALYSIS, ROUTINE W REFLEX MICROSCOPIC (NOT AT [facility]) - Abnormal; Notable for the following:    APPearance CLOUDY (*)    Hgb urine dipstick MODERATE (*)    Protein, ur 30 (*)    Leukocytes, UA LARGE (*)    All other components within normal limits  CBC WITH DIFFERENTIAL/PLATELET - Abnormal; Notable for the following:    Hemoglobin 11.0 (*)    HCT 34.3 (*)    Neutro Abs 8.2 (*)    All other components within normal limits  COMPREHENSIVE METABOLIC PANEL - Abnormal; Notable for the following:    Creatinine, Ser 0.32 (*)    Total Protein 6.0 (*)    Albumin 2.4 (*)    AST 12 (*)  ALT 9 (*)    All other components within normal limits  URINE CULTURE  CULTURE, BLOOD (ROUTINE X 2)  CULTURE, BLOOD (ROUTINE X 2)  URINE MICROSCOPIC-ADD ON  URINE RAPID DRUG SCREEN, HOSP PERFORMED  POC URINE PREG, ED  I-STAT CG4 LACTIC ACID, ED  I-STAT CG4 LACTIC ACID, ED    ;/' ging Review Dg Chest Port 1 View  10/29/2014   CLINICAL DATA:  History of asthma.  Smoker.  Admitted for UTI.  EXAM: PORTABLE CHEST 1 VIEW  COMPARISON:  10/22/2014  FINDINGS: Dual lead cardiac pacemaker is unchanged.  Cardiomediastinal silhouette is normal. Mediastinal contours appear intact.  There is no evidence of focal airspace consolidation, pleural effusion or pneumothorax.  Osseous structures are without acute abnormality. Soft tissues are grossly normal.  IMPRESSION: No radiographic evidence of acute cardiopulmonary abnormality.   Electronically Signed   By: Fidela Salisbury M.D.   On: 10/29/2014 14:22   I have personally reviewed and evaluated these images and lab results as part of my medical decision-making.   EKG  Interpretation None      MDM   Final diagnoses:  Weakness  Transient alteration of awareness    44 yo F with a chief complaint of a possible UTI. Chest x-ray negative for acute infection. No noted white blood cell count elevation. Labs otherwise unremarkable. Awaiting UA.  Patient turned over to Dr. Billy Fischer.   Deno Etienne, DO 10/30/14 [phone]

## 2014-10-30 DIAGNOSIS — G92 Toxic encephalopathy: Secondary | ICD-10-CM | POA: Diagnosis not present

## 2014-10-30 DIAGNOSIS — D509 Iron deficiency anemia, unspecified: Secondary | ICD-10-CM | POA: Diagnosis not present

## 2014-10-30 DIAGNOSIS — E43 Unspecified severe protein-calorie malnutrition: Secondary | ICD-10-CM | POA: Diagnosis not present

## 2014-10-30 MED ORDER — BACLOFEN 10 MG PO TABS: 10.0000 mg | ORAL_TABLET | Freq: Three times a day (TID) | ORAL | Status: DC

## 2014-10-30 MED ORDER — SACCHAROMYCES BOULARDII 250 MG PO CAPS: 250.0000 mg | ORAL_CAPSULE | Freq: Two times a day (BID) | ORAL | Status: DC

## 2014-10-30 NOTE — Progress Notes (Signed)
CSW received notification that pt needing ambulance transport home.   CSW met with pt at bedside and confirmed address.   CSW discussed with RN and RN to call [facility] [phone]) when ready.   Discharge papers placed at nursing station for [facility].  No further social work needs identified at this time.   CSW signing off.   Alison Murray, MSW, [place] [facility] [phone]

## 2014-10-30 NOTE — Progress Notes (Signed)
Pt has been hallucinating throughout the night. Heard her talking to her mother, who is not present in the room. She has said "Mom, can you bring me a cigarette?" "Mom, I know you're there. You're just ignoring me for some reason." "Mom tell the nurses I'm not in the hospital." However, she is alert to self and place when questioned. Stage IV ulcers assessed and dressed with assistance of WTA RN. Air overlay mattress ordered and in use. Pt declines turning at times, saying that it hurts to lay on her side, or she can't see the TV. Hortencia Conradi RN

## 2014-10-30 NOTE — Progress Notes (Signed)
Patient discharged home, all discharge medications and instructions reviewed and questions answered. Patient to be transported via [place].

## 2014-10-30 NOTE — Discharge Summary (Signed)
Physician Discharge Summary  Angela Page HXT:[id number] DOB: March 03, 1970 DOA: 10/29/2014  PCP: No PCP Per Patient  Admit date: 10/29/2014 Discharge date: 10/30/2014  Recommendations for Outpatient Follow-up:  1. Baclofen dose was reduced to 10 mg 3 times a day 2. Discharge to home with home health services  Discharge Diagnoses:  Principal Problem:   Toxic metabolic encephalopathy Active Problems:   Protein-calorie malnutrition, severe (HCC)   Quadriplegia, C5-C7 incomplete (HCC)   Pressure ulcer of sacral region, stage 4 (HCC)   Anemia, iron deficiency   Urinary tract infection due to Proteus   Altered mental status   Encephalopathy, toxic   Discharge Condition: Stable, improved  Diet recommendation: Regular  Wt Readings from Last 3 Encounters:  10/29/14 42.5 kg (93 lb 11.1 oz)  10/22/14 42.4 kg (93 lb 7.6 oz)  09/02/14 44.3 kg (97 lb 10.6 oz)    History of present illness:   The patient is a 44 year old female with history of quadriparesis secondary to a fall with neck trauma approximately one year ago, 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 She is cared for by her mother. She has been admitted to the hospital 4 times in the last 6 months. During previous admissions she had problems with oversedation and required Narcan during one of her admissions. She was most recently admitted from 10/2 through 10/6 secondary to septic shock from Proteus urinary tract infection. Since discharge, her baclofen dose was increased accidentally to 30 mg 3 times a day. She had not been taking any narcotics or benzodiazepine so according to her mother. She became increasingly sleepy, unable to eat or drink much, and gradually became confused with nonsensical talking. Her mother brought her to the emergency department for  reevaluation. In the emergency department, her vital signs were stable, her labs were at baseline, her urinalysis continued to demonstrate too numerous to count RBCs, however she is still in the middle of treatment for previous urinary tract infection. Her Foley catheter was exchanged and she was admitted to observation secondary to sedation and delirium.  Hospital Course:   Drug overdose/acute encephalopathy, likely secondary to baclofen. She is also on other sedating medications such as anti-depressed since and at gabapentin, any number of which could also be contributing however these are long-term medications. Given the history of her baclofen dose being increased suddenly I suspect that this was the ultimate cause of her symptoms. Her baclofen was discontinued during hospitalization and her mentation gradually improved. She became more alert and was able to have a more regular conversation. She continued to have episodes of confusion which I believe will gradually improve also. If they do not, I recommend that she follow-up with her primary care doctor in the next week for reevaluation and consideration of MRI of the brain.  Previously diagnosed Proteus urinary tract infection, she continue Bactrim to continue till October 23. She was given for store.  Normocytic anemia/iron deficiency anemia and anemia of chronic inflammation secondary to osteomyelitis. She continued iron supplementation.  Quadriplegia, stable, she was able to move upper extremities but not use her hands. Her lower extremities are flaccid and she has chronic urinary retention.  Nonhealing stage IV pressure ulcers on the sacrum, left issue him, upper buttock. She continued wound care with wet-to-dry dressings with silver hydrocolloid. She also continued chronic doxycycline use for suppression of osteomyelitis.  Depression, stable, continue Celexa and  Cymbalta.  Prolonged QTC, we avoided QTC prolonging medications.  Severe  protein calorie malnutrition, she continued a regular diet with supplements.  Procedures:  None  Consultations:  None  Discharge Exam: Filed Vitals:   10/30/14 0532  BP: 95/74  Pulse: 84  Temp: 96.9 F (36.1 C)  Resp: 18   Filed Vitals:   10/29/14 1732 10/29/14 1855 10/29/14 2013 10/30/14 0532  BP: 118/78 124/81 135/92 95/74  Pulse: 80 80 74 84  Temp: 97.9 F (36.6 C) 98.4 F (36.9 C) 97.3 F (36.3 C) 96.9 F (36.1 C)  TempSrc: Oral Oral Oral Oral  Resp: 16 18 18 18   Height:  5\' 2"  (1.575 m)    Weight:  42.5 kg (93 lb 11.1 oz)    SpO2: 98% 100% 97% 90%    General: Frail, cachectic female, no acute distress, able to have a more normal conversation. No twitching noted on exam and she was able to make and maintain eye contact. No nonsensical talking. Cardiovascular: Regular rate and rhythm, no murmurs, rubs, or gallops Respiratory: Clear to auscultation bilaterally anteriorly Abdomen: NABS, soft, nondistended, nontender MSK: Contractures of bilateral upper extremities, bilateral lower extremity are flaccid with contractures Skin: Deferred examination of her sacral decubitus ulcers this morning. They were examined last evening.  Discharge Instructions      Discharge Instructions    Call MD for:  difficulty breathing, headache or visual disturbances    Complete by:  As directed      Call MD for:  extreme fatigue    Complete by:  As directed      Call MD for:  hives    Complete by:  As directed      Call MD for:  persistant dizziness or light-headedness    Complete by:  As directed      Call MD for:  persistant nausea and vomiting    Complete by:  As directed      Call MD for:  redness, tenderness, or signs of infection (pain, swelling, redness, odor or green/yellow discharge around incision site)    Complete by:  As directed      Call MD for:  severe uncontrolled pain    Complete by:  As directed      Call MD for:  temperature >100.4    Complete by:  As  directed      Diet general    Complete by:  As directed      Discharge instructions    Complete by:  As directed   Please STOP the previous baclofen.  She may have baclofen 10mg  three times a day for now.  If her thinking remains clear and she continues to have severe muscle spasms, please talk to her primary care doctor before making any changes to her dose.     Discharge wound care:    Complete by:  As directed   As before with the silver hydrocolloid and wet to dry dressings once daily.     Increase activity slowly    Complete by:  As directed             Medication List    TAKE these medications        acetaminophen 325 MG tablet  Commonly known as:  TYLENOL  Take 2 tablets (650 mg total) by mouth every 6 (six) hours as needed for fever.     albuterol 108 (90 BASE) MCG/ACT inhaler  Commonly known as:  PROVENTIL HFA;VENTOLIN HFA  Inhale 2  puffs into the lungs every 6 (six) hours as needed. For shortness of breath     baclofen 10 MG tablet  Commonly known as:  LIORESAL  Take 1 tablet (10 mg total) by mouth 3 (three) times daily.     citalopram 20 MG tablet  Commonly known as:  CELEXA  Take 20 mg by mouth daily with breakfast.     collagenase ointment  Commonly known as:  SANTYL  Apply 1 application topically daily as needed (wounds).     CVS VITAMIN B12 1000 MCG tablet  Generic drug:  cyanocobalamin  Take 1 tablet by mouth daily.     doxycycline 100 MG tablet  Commonly known as:  VIBRA-TABS  Take 100 mg by mouth every 12 (twelve) hours. continous     DULoxetine 20 MG capsule  Commonly known as:  CYMBALTA  Take 20 mg by mouth 2 (two) times daily.     feeding supplement (ENSURE ENLIVE) Liqd  Take 237 mLs by mouth 2 (two) times daily between meals.     lactose free nutrition Liqd  Take 237 mLs by mouth 3 (three) times daily as needed (PRN).     ferrous sulfate 325 (65 FE) MG tablet  Take 1 tablet (325 mg total) by mouth 3 (three) times daily.     gabapentin  400 MG capsule  Commonly known as:  NEURONTIN  Take 400 mg by mouth 3 (three) times daily.     ibuprofen 200 MG tablet  Commonly known as:  ADVIL,MOTRIN  Take 400 mg by mouth every 6 (six) hours as needed for moderate pain.     saccharomyces boulardii 250 MG capsule  Commonly known as:  FLORASTOR  Take 1 capsule (250 mg total) by mouth 2 (two) times daily.     silver sulfADIAZINE 1 % cream  Commonly known as:  SILVADENE  Apply 1 application topically every other day as needed (for wound care).     sulfamethoxazole-trimethoprim 800-160 MG tablet  Commonly known as:  BACTRIM DS,SEPTRA DS  Take 1 tablet by mouth every 12 (twelve) hours.     zinc oxide 11.3 % Crea cream  Commonly known as:  BALMEX  Apply 1 application topically 3 (three) times daily as needed (for skin redness/irritation).       Follow-up Information    Follow up with [facility].   Why:  Family doctor   Contact information:   [place] [id number] [phone]        The results of significant diagnostics from this hospitalization (including imaging, microbiology, ancillary and laboratory) are listed below for reference.    Significant Diagnostic Studies: Dg Chest Port 1 View  10/29/2014   CLINICAL DATA:  History of asthma.  Smoker.  Admitted for UTI.  EXAM: PORTABLE CHEST 1 VIEW  COMPARISON:  10/22/2014  FINDINGS: Dual lead cardiac pacemaker is unchanged.  Cardiomediastinal silhouette is normal. Mediastinal contours appear intact.  There is no evidence of focal airspace consolidation, pleural effusion or pneumothorax.  Osseous structures are without acute abnormality. Soft tissues are grossly normal.  IMPRESSION: No radiographic evidence of acute cardiopulmonary abnormality.   Electronically Signed   By: Fidela Salisbury M.D.   On: 10/29/2014 14:22   Dg Chest Port 1 View  10/22/2014   CLINICAL DATA:  Weakness, shortness of breath  EXAM: PORTABLE CHEST 1 VIEW   COMPARISON:  09/03/2014  FINDINGS: There is no focal parenchymal opacity. There is no pleural effusion  or pneumothorax. The heart and mediastinal contours are unremarkable. There is a dual lead cardiac pacemaker.  There is evidence of prior posterior cervical fusion.  IMPRESSION: No active disease.   Electronically Signed   By: Kathreen Devoid   On: 10/22/2014 18:36    Microbiology: Recent Results (from the past 240 hour(s))  Blood Culture (routine x 2)     Status: None   Collection Time: 10/22/14  4:52 PM  Result Value Ref Range Status   Specimen Description BLOOD RIGHT ARM  5 ML IN [facility] BOTTLE  Final   Special Requests NONE  Final   Culture   Final    NO GROWTH 5 DAYS Performed at [facility]    Report Status 10/28/2014 FINAL  Final  Urine culture     Status: None   Collection Time: 10/22/14  6:30 PM  Result Value Ref Range Status   Specimen Description URINE, CLEAN CATCH  Final   Special Requests NONE  Final   Culture   Final    >=100,000 COLONIES/mL PROTEUS MIRABILIS Performed at [facility]    Report Status 10/25/2014 FINAL  Final   Organism ID, Bacteria PROTEUS MIRABILIS  Final      Susceptibility   Proteus mirabilis - MIC*    AMPICILLIN <=2 SENSITIVE Sensitive     CEFAZOLIN 8 SENSITIVE Sensitive     CEFTRIAXONE <=1 SENSITIVE Sensitive     CIPROFLOXACIN 2 INTERMEDIATE Intermediate     GENTAMICIN <=1 SENSITIVE Sensitive     IMIPENEM 8 INTERMEDIATE Intermediate     NITROFURANTOIN 128 RESISTANT Resistant     TRIMETH/SULFA <=20 SENSITIVE Sensitive     AMPICILLIN/SULBACTAM 4 SENSITIVE Sensitive     PIP/TAZO <=4 SENSITIVE Sensitive     * >=100,000 COLONIES/mL PROTEUS MIRABILIS  Blood Culture (routine x 2)     Status: None   Collection Time: 10/22/14  7:45 PM  Result Value Ref Range Status   Specimen Description BLOOD FEMORAL ARTERY  Final   Special Requests BOTTLES DRAWN AEROBIC AND ANAEROBIC 5ML  Final   Culture   Final    NO GROWTH 5 DAYS Performed at  [facility]    Report Status 10/28/2014 FINAL  Final  MRSA PCR Screening     Status: None   Collection Time: 10/23/14 12:59 AM  Result Value Ref Range Status   MRSA by PCR NEGATIVE NEGATIVE Final    Comment:        The GeneXpert MRSA Assay (FDA approved for NASAL specimens only), is one component of a comprehensive MRSA colonization surveillance program. It is not intended to diagnose MRSA infection nor to guide or monitor treatment for MRSA infections.   Urine culture     Status: None (Preliminary result)   Collection Time: 10/29/14  3:21 PM  Result Value Ref Range Status   Specimen Description URINE, CATHETERIZED  Final   Special Requests NONE  Final   Culture   Final    CULTURE REINCUBATED FOR BETTER GROWTH Performed at [facility]    Report Status PENDING  Incomplete     Labs: Basic Metabolic Panel:  Recent Labs Lab 10/25/14 0405 10/26/14 0534 10/29/14 1412  NA 138 139 139  K 3.2* 3.4* 4.1  CL 109 110 107  CO2 24 23 25   GLUCOSE 76 92 73  BUN 9 8 8   CREATININE 0.33* 0.37* 0.32*  CALCIUM 8.7* 8.9 9.0   Liver Function Tests:  Recent Labs Lab 10/29/14 1412  AST 12*  ALT 9*  ALKPHOS 85  BILITOT 0.3  PROT 6.0*  ALBUMIN 2.4*   No results for input(s): LIPASE, AMYLASE in the last 168 hours. No results for input(s): AMMONIA in the last 168 hours. CBC:  Recent Labs Lab 10/25/14 0405 10/26/14 0534 10/29/14 1412  WBC 6.1 6.3 10.3  NEUTROABS  --   --  8.2*  HGB 8.9* 9.9* 11.0*  HCT 28.1* 31.2* 34.3*  MCV 87.0 87.9 87.3  PLT 316 342 399   Cardiac Enzymes: No results for input(s): CKTOTAL, CKMB, CKMBINDEX, TROPONINI in the last 168 hours. BNP: BNP (last 3 results) No results for input(s): BNP in the last 8760 hours.  ProBNP (last 3 results) No results for input(s): PROBNP in the last 8760 hours.  CBG: No results for input(s): GLUCAP in the last 168 hours.  Time coordinating discharge: 35 minutes  Signed:  Audrina Marten,  Brelee Renk  [facility] 10/30/2014, 10:36 AM

## 2014-11-02 LAB — URINE CULTURE

## 2014-11-03 LAB — CULTURE, BLOOD (ROUTINE X 2): Culture: NO GROWTH

## 2014-11-04 LAB — CULTURE, BLOOD (ROUTINE X 2): Culture: NO GROWTH

## 2015-01-31 ENCOUNTER — Inpatient Hospital Stay (HOSPITAL_COMMUNITY)
Admission: EM | Admit: 2015-01-31 | Discharge: 2015-02-03 | DRG: 698 | Disposition: A | Discharge status: Home / Self Care | Payer: Medicaid Other | Attending: Internal Medicine | Admitting: Internal Medicine

## 2015-01-31 ENCOUNTER — Encounter (HOSPITAL_COMMUNITY): Payer: Self-pay

## 2015-01-31 ENCOUNTER — Emergency Department (HOSPITAL_COMMUNITY): Payer: Medicaid Other

## 2015-01-31 DIAGNOSIS — G9340 Encephalopathy, unspecified: Secondary | ICD-10-CM | POA: Diagnosis present

## 2015-01-31 DIAGNOSIS — N390 Urinary tract infection, site not specified: Secondary | ICD-10-CM | POA: Diagnosis present

## 2015-01-31 DIAGNOSIS — E876 Hypokalemia: Secondary | ICD-10-CM | POA: Diagnosis present

## 2015-01-31 DIAGNOSIS — J189 Pneumonia, unspecified organism: Secondary | ICD-10-CM | POA: Diagnosis present

## 2015-01-31 DIAGNOSIS — B952 Enterococcus as the cause of diseases classified elsewhere: Secondary | ICD-10-CM | POA: Diagnosis present

## 2015-01-31 DIAGNOSIS — Z883 Allergy status to other anti-infective agents status: Secondary | ICD-10-CM | POA: Diagnosis not present

## 2015-01-31 DIAGNOSIS — A419 Sepsis, unspecified organism: Secondary | ICD-10-CM | POA: Diagnosis present

## 2015-01-31 DIAGNOSIS — K219 Gastro-esophageal reflux disease without esophagitis: Secondary | ICD-10-CM | POA: Diagnosis present

## 2015-01-31 DIAGNOSIS — Z880 Allergy status to penicillin: Secondary | ICD-10-CM

## 2015-01-31 DIAGNOSIS — L89154 Pressure ulcer of sacral region, stage 4: Secondary | ICD-10-CM | POA: Diagnosis present

## 2015-01-31 DIAGNOSIS — G8254 Quadriplegia, C5-C7 incomplete: Secondary | ICD-10-CM | POA: Diagnosis present

## 2015-01-31 DIAGNOSIS — F17210 Nicotine dependence, cigarettes, uncomplicated: Secondary | ICD-10-CM | POA: Diagnosis present

## 2015-01-31 DIAGNOSIS — J181 Lobar pneumonia, unspecified organism: Secondary | ICD-10-CM

## 2015-01-31 DIAGNOSIS — G8250 Quadriplegia, unspecified: Secondary | ICD-10-CM | POA: Diagnosis present

## 2015-01-31 DIAGNOSIS — I959 Hypotension, unspecified: Secondary | ICD-10-CM | POA: Diagnosis present

## 2015-01-31 DIAGNOSIS — J45909 Unspecified asthma, uncomplicated: Secondary | ICD-10-CM | POA: Diagnosis present

## 2015-01-31 DIAGNOSIS — Y731 Therapeutic (nonsurgical) and rehabilitative gastroenterology and urology devices associated with adverse incidents: Secondary | ICD-10-CM | POA: Diagnosis present

## 2015-01-31 DIAGNOSIS — E43 Unspecified severe protein-calorie malnutrition: Secondary | ICD-10-CM | POA: Diagnosis present

## 2015-01-31 DIAGNOSIS — M62838 Other muscle spasm: Secondary | ICD-10-CM | POA: Diagnosis present

## 2015-01-31 DIAGNOSIS — Z681 Body mass index (BMI) 19 or less, adult: Secondary | ICD-10-CM

## 2015-01-31 DIAGNOSIS — Y92009 Unspecified place in unspecified non-institutional (private) residence as the place of occurrence of the external cause: Secondary | ICD-10-CM

## 2015-01-31 DIAGNOSIS — T83511A Infection and inflammatory reaction due to indwelling urethral catheter, initial encounter: Secondary | ICD-10-CM | POA: Diagnosis present

## 2015-01-31 LAB — COMPREHENSIVE METABOLIC PANEL
ALK PHOS: 143 U/L — AB (ref 38–126)
ALT: 9 U/L — AB (ref 14–54)
AST: 16 U/L (ref 15–41)
Albumin: 2.6 g/dL — ABNORMAL LOW (ref 3.5–5.0)
Anion gap: 14 (ref 5–15)
BUN: 12 mg/dL (ref 6–20)
CHLORIDE: 97 mmol/L — AB (ref 101–111)
CO2: 27 mmol/L (ref 22–32)
Calcium: 9.6 mg/dL (ref 8.9–10.3)
Creatinine, Ser: 0.59 mg/dL (ref 0.44–1.00)
GFR calc Af Amer: >60 mL/min (ref >60)
Glucose, Bld: 89 mg/dL (ref 65–99)
POTASSIUM: 4.1 mmol/L (ref 3.5–5.1)
Sodium: 138 mmol/L (ref 135–145)
TOTAL PROTEIN: 7.8 g/dL (ref 6.5–8.1)
Total Bilirubin: 0.3 mg/dL (ref 0.3–1.2)

## 2015-01-31 LAB — CBC WITH DIFFERENTIAL/PLATELET
BASOS ABS: 0 10*3/uL (ref 0.0–0.1)
Basophils Relative: 0 %
EOS ABS: 0.1 10*3/uL (ref 0.0–0.7)
EOS PCT: 1 %
HCT: 40.5 % (ref 36.0–46.0)
HEMOGLOBIN: 12.6 g/dL (ref 12.0–15.0)
LYMPHS ABS: 1.1 10*3/uL (ref 0.7–4.0)
LYMPHS PCT: 16 %
MCH: 28.5 pg (ref 26.0–34.0)
MCHC: 31.1 g/dL (ref 30.0–36.0)
MCV: 91.6 fL (ref 78.0–100.0)
Monocytes Absolute: 0.3 10*3/uL (ref 0.1–1.0)
Monocytes Relative: 5 %
NEUTROS PCT: 78 %
Neutro Abs: 5.4 10*3/uL (ref 1.7–7.7)
PLATELETS: 374 10*3/uL (ref 150–400)
RBC: 4.42 MIL/uL (ref 3.87–5.11)
RDW: 15.1 % (ref 11.5–15.5)
WBC: 6.8 10*3/uL (ref 4.0–10.5)

## 2015-01-31 LAB — PROTIME-INR
INR: 1.14 (ref 0.00–1.49)
Prothrombin Time: 14.8 seconds (ref 11.6–15.2)

## 2015-01-31 LAB — URINALYSIS, ROUTINE W REFLEX MICROSCOPIC
Bilirubin Urine: NEGATIVE
Glucose, UA: NEGATIVE mg/dL
Ketones, ur: NEGATIVE mg/dL
Nitrite: NEGATIVE
PROTEIN: 100 mg/dL — AB
SPECIFIC GRAVITY, URINE: 1.017 (ref 1.005–1.030)
pH: 8 (ref 5.0–8.0)

## 2015-01-31 LAB — I-STAT CG4 LACTIC ACID, ED
LACTIC ACID, VENOUS: 0.58 mmol/L (ref 0.5–2.0)
Lactic Acid, Venous: 3.13 mmol/L (ref 0.5–2.0)

## 2015-01-31 LAB — URINE MICROSCOPIC-ADD ON

## 2015-01-31 LAB — PROCALCITONIN: PROCALCITONIN: 0.2 ng/mL

## 2015-01-31 LAB — APTT: APTT: 34 s (ref 24–37)

## 2015-01-31 MED ORDER — SODIUM CHLORIDE 0.9 % IV BOLUS (SEPSIS)
500.0000 mL | INTRAVENOUS | Status: AC
  Administered 2015-01-31: 500 mL via INTRAVENOUS

## 2015-01-31 MED ORDER — SODIUM CHLORIDE 0.9 % IJ SOLN
3.0000 mL | Freq: Two times a day (BID) | INTRAMUSCULAR | Status: DC
Start: 2015-01-31 — End: 2015-02-03
  Administered 2015-01-31 – 2015-02-03 (×5): 3 mL via INTRAVENOUS

## 2015-01-31 MED ORDER — METHYLPREDNISOLONE SODIUM SUCC 125 MG IJ SOLR
125.0000 mg | Freq: Once | INTRAMUSCULAR | Status: AC
  Administered 2015-01-31: 125 mg via INTRAVENOUS
  Filled 2015-01-31: qty 2

## 2015-01-31 MED ORDER — OXYCODONE HCL 5 MG PO TABS
5.0000 mg | ORAL_TABLET | ORAL | Status: DC | PRN
  Administered 2015-01-31 – 2015-02-03 (×14): 5 mg via ORAL
  Filled 2015-01-31 (×14): qty 1

## 2015-01-31 MED ORDER — IPRATROPIUM-ALBUTEROL 0.5-2.5 (3) MG/3ML IN SOLN
3.0000 mL | RESPIRATORY_TRACT | Status: DC
  Administered 2015-01-31 – 2015-02-02 (×9): 3 mL via RESPIRATORY_TRACT
  Filled 2015-01-31 (×9): qty 3

## 2015-01-31 MED ORDER — ONDANSETRON HCL 4 MG/2ML IJ SOLN
4.0000 mg | Freq: Four times a day (QID) | INTRAMUSCULAR | Status: DC | PRN
Start: 2015-01-31 — End: 2015-02-03

## 2015-01-31 MED ORDER — DULOXETINE HCL 30 MG PO CPEP
30.0000 mg | ORAL_CAPSULE | Freq: Two times a day (BID) | ORAL | Status: DC
  Administered 2015-02-01 – 2015-02-03 (×5): 30 mg via ORAL
  Filled 2015-01-31 (×5): qty 1

## 2015-01-31 MED ORDER — SODIUM CHLORIDE 0.9 % IV BOLUS (SEPSIS)
500.0000 mL | Freq: Once | INTRAVENOUS | Status: AC
  Administered 2015-01-31: 500 mL via INTRAVENOUS

## 2015-01-31 MED ORDER — FERROUS SULFATE 325 (65 FE) MG PO TABS
325.0000 mg | ORAL_TABLET | Freq: Three times a day (TID) | ORAL | Status: DC
  Administered 2015-01-31 – 2015-02-03 (×8): 325 mg via ORAL
  Filled 2015-01-31 (×9): qty 1

## 2015-01-31 MED ORDER — HYDROMORPHONE HCL 1 MG/ML IJ SOLN: 0.5000 mg | INTRAMUSCULAR | Status: DC | PRN

## 2015-01-31 MED ORDER — BACLOFEN 20 MG PO TABS
20.0000 mg | ORAL_TABLET | Freq: Every day | ORAL | Status: DC
  Administered 2015-02-01 – 2015-02-03 (×3): 20 mg via ORAL
  Filled 2015-01-31 (×3): qty 1

## 2015-01-31 MED ORDER — ACETAMINOPHEN 325 MG PO TABS
650.0000 mg | ORAL_TABLET | Freq: Once | ORAL | Status: AC
  Administered 2015-01-31: 650 mg via ORAL
  Filled 2015-01-31: qty 2

## 2015-01-31 MED ORDER — VANCOMYCIN HCL 500 MG IV SOLR
500.0000 mg | Freq: Two times a day (BID) | INTRAVENOUS | Status: DC
  Administered 2015-02-01 – 2015-02-03 (×5): 500 mg via INTRAVENOUS
  Filled 2015-01-31 (×7): qty 500

## 2015-01-31 MED ORDER — ACETAMINOPHEN 650 MG RE SUPP: 650.0000 mg | Freq: Four times a day (QID) | RECTAL | Status: DC | PRN

## 2015-01-31 MED ORDER — MIDODRINE HCL 2.5 MG PO TABS
2.5000 mg | ORAL_TABLET | Freq: Three times a day (TID) | ORAL | Status: DC
  Administered 2015-02-01 – 2015-02-03 (×7): 2.5 mg via ORAL
  Filled 2015-01-31 (×9): qty 1

## 2015-01-31 MED ORDER — VANCOMYCIN HCL IN DEXTROSE 1-5 GM/200ML-% IV SOLN
1000.0000 mg | Freq: Once | INTRAVENOUS | Status: AC
  Administered 2015-01-31: 1000 mg via INTRAVENOUS
  Filled 2015-01-31: qty 200

## 2015-01-31 MED ORDER — ONDANSETRON HCL 4 MG PO TABS: 4.0000 mg | ORAL_TABLET | Freq: Four times a day (QID) | ORAL | Status: DC | PRN

## 2015-01-31 MED ORDER — ACETAMINOPHEN 325 MG PO TABS: 650.0000 mg | ORAL_TABLET | Freq: Four times a day (QID) | ORAL | Status: DC | PRN

## 2015-01-31 MED ORDER — ALUM & MAG HYDROXIDE-SIMETH 200-200-20 MG/5ML PO SUSP: 30.0000 mL | Freq: Four times a day (QID) | ORAL | Status: DC | PRN

## 2015-01-31 MED ORDER — LEVOFLOXACIN IN D5W 750 MG/150ML IV SOLN
750.0000 mg | Freq: Once | INTRAVENOUS | Status: AC
  Administered 2015-01-31: 750 mg via INTRAVENOUS
  Filled 2015-01-31: qty 150

## 2015-01-31 MED ORDER — SODIUM CHLORIDE 0.9 % IV BOLUS (SEPSIS)
1000.0000 mL | Freq: Once | INTRAVENOUS | Status: AC
  Administered 2015-01-31: 1000 mL via INTRAVENOUS

## 2015-01-31 MED ORDER — GABAPENTIN 400 MG PO CAPS
400.0000 mg | ORAL_CAPSULE | Freq: Three times a day (TID) | ORAL | Status: DC
  Administered 2015-01-31 – 2015-02-03 (×8): 400 mg via ORAL
  Filled 2015-01-31 (×9): qty 1

## 2015-01-31 MED ORDER — SODIUM CHLORIDE 0.9 % IV SOLN
INTRAVENOUS | Status: DC
  Administered 2015-01-31 – 2015-02-02 (×4): via INTRAVENOUS

## 2015-01-31 MED ORDER — BACLOFEN 10 MG PO TABS
10.0000 mg | ORAL_TABLET | ORAL | Status: DC
  Administered 2015-01-31 – 2015-02-02 (×5): 10 mg via ORAL
  Filled 2015-01-31 (×6): qty 1

## 2015-01-31 MED ORDER — TIZANIDINE HCL 2 MG PO TABS
2.0000 mg | ORAL_TABLET | Freq: Three times a day (TID) | ORAL | Status: DC | PRN
  Filled 2015-01-31: qty 1

## 2015-01-31 MED ORDER — ENOXAPARIN SODIUM 40 MG/0.4ML ~~LOC~~ SOLN
40.0000 mg | SUBCUTANEOUS | Status: DC
  Administered 2015-01-31: 40 mg via SUBCUTANEOUS
  Filled 2015-01-31: qty 0.4

## 2015-01-31 MED ORDER — LEVOFLOXACIN IN D5W 750 MG/150ML IV SOLN
750.0000 mg | INTRAVENOUS | Status: DC
  Administered 2015-02-01 – 2015-02-02 (×2): 750 mg via INTRAVENOUS
  Filled 2015-01-31 (×3): qty 150

## 2015-01-31 NOTE — Progress Notes (Addendum)
ANTIBIOTIC CONSULT NOTE - INITIAL  Pharmacy Consult for Vancomycin & Levaquin, Adding Aztreonam Indication: rule out pneumonia, rule out sepsis  Allergies  Allergen Reactions  . Erythromycin Nausea And Vomiting  . Nitrofurantoin Monohyd Macro Nausea And Vomiting  . Penicillins Hives    Has patient had a PCN reaction causing immediate rash, facial/tongue/throat swelling, SOB or lightheadedness with hypotension: Yes Has patient had a PCN reaction causing severe rash involving mucus membranes or skin necrosis: Yes Has patient had a PCN reaction that required hospitalization Yes- went to the DR Has patient had a PCN reaction occurring within the last 10 years: No-childhood allergy If all of the above answers are "NO", then may proceed with Cephalosporin use.    Patient Measurements:   Total body weight: stated 93lb, 42.2kg  Vital Signs:   Intake/Output from previous day:   Intake/Output from this shift:   Labs: No results for input(s): WBC, HGB, PLT, LABCREA, CREATININE in the last 72 hours. CrCl cannot be calculated (Unknown ideal weight.). No results for input(s): VANCOTROUGH, VANCOPEAK, VANCORANDOM, GENTTROUGH, GENTPEAK, GENTRANDOM, TOBRATROUGH, TOBRAPEAK, TOBRARND, AMIKACINPEAK, AMIKACINTROU, AMIKACIN in the last 72 hours.   Microbiology: No results found for this or any previous visit (from the past 720 hour(s)).  Medical History: Past Medical History  Diagnosis Date  . Asthma   . Pleurisy   . GERD (gastroesophageal reflux disease)   . Quadriparesis ([place])   . Pacemaker    Medications:  Scheduled:  . ipratropium-albuterol  3 mL Nebulization Q4H   Anti-infectives    Start     Dose/Rate Route Frequency Ordered Stop   01/31/15 1700  levofloxacin (LEVAQUIN) IVPB 750 mg     750 mg 100 mL/hr over 90 Minutes Intravenous  Once 01/31/15 1655 01/31/15 1954   01/31/15 1700  vancomycin (VANCOCIN) IVPB 1000 mg/200 mL premix     1,000 mg 200 mL/hr over 60 Minutes Intravenous   Once 01/31/15 1655 01/31/15 1924     Assessment: 35 yoF quadriplegic, to ED via EMS with fever, chills, cough and wheezing, hx of Asthma. Vancomycin and Levaquin begun in ED, further doses per Pharmacy. Added Aztreonam. CXray possible PNA  Goal of Therapy:  Vancomycin trough level 15-20 mcg/ml  Plan:   Vancomycin 1gm x1 in ED, followed by 500mg  q12  Levofloxacin 750mg  IV x1, followed by Levofloxacin 750mg  q24  Aztreonam 1gm q8hr  Minda Ditto PharmD Pager [phone] 01/31/2015, 8:22 PM

## 2015-01-31 NOTE — H&P (Signed)
[facility] Admission History and Physical       [name] [id number] DOB: [birth year] DOA: 01/31/2015  Referring physician: EDP PCP: No PCP Per Patient  Specialists:   Chief Complaint: SOB and Increased Cough with Fever and Chills  HPI: Angela Page is a 45 y.o. 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  Her temperature was 103 in the ED and she was found to have pneumonia on Chest X-ray and A UTI as well as an elevated lactic Acid Level and was placed on the Sepsis Protocol.   She was started on IV Vancomycin Levaquin and Aztreonam and referred for admssion   Review of Systems:  Constitutional: No Weight Loss, No Weight Gain, Night Sweats, +Fevers, Chills, Dizziness, Light Headedness, Fatigue, or Generalized Weakness HEENT: No Headaches, Difficulty Swallowing,Tooth/Dental Problems,Sore Throat,  No Sneezing, Rhinitis, Ear Ache, Nasal Congestion, or Post Nasal Drip,  Cardio-vascular:  No Chest pain, Orthopnea, PND, Edema in Lower Extremities, Anasarca, Dizziness, Palpitations  Resp: +Dyspnea, No DOE, No Productive Cough,  +Non-Productive Cough, No Hemoptysis, No Wheezing.    GI: No Heartburn, Indigestion, Abdominal Pain, Nausea, Vomiting, Diarrhea, Constipation, Hematemesis, Hematochezia, Melena, Change in Bowel Habits,  Loss of Appetite  GU: No Dysuria, No Change in Color of Urine, No Urgency or Urinary Frequency, No Flank pain.  Musculoskeletal: No Joint Pain or Swelling, No Decreased Range of Motion, No Back Pain.  Neurologic: No Syncope, No Seizures, Paresthesia, Vision Disturbance or Loss, No Diplopia, No Vertigo  Skin: No Rash or Lesions. Psych: No Change in Mood or Affect, No Depression or Anxiety, No Memory loss, No Confusion, or  Hallucinations   Past Medical History  Diagnosis Date  . Asthma   . Pleurisy   . GERD (gastroesophageal reflux disease)   . Quadriparesis ([place])   . Pacemaker      Past Surgical History  Procedure Laterality Date  . Appendectomy    . Cardiac surgery    . I&d extremity  10/28/2011    Procedure: IRRIGATION AND DEBRIDEMENT EXTREMITY;  Surgeon: Tennis Must, MD;  Location: [place];  Service: Orthopedics;  Laterality: Right;  Irrigation and debridement right middle finger      Prior to Admission medications   Medication Sig Start Date End Date Taking? Authorizing Provider  acetaminophen (TYLENOL) 325 MG tablet Take 2 tablets (650 mg total) by mouth every 6 (six) hours as needed for fever. 09/06/14  Yes Robbie Lis, MD  albuterol (PROVENTIL HFA;VENTOLIN HFA) 108 (90 BASE) MCG/ACT inhaler Inhale 2 puffs into the lungs every 6 (six) hours as needed. For shortness of breath    Yes Historical Provider, MD  baclofen (LIORESAL) 10 MG tablet Take 1 tablet (10 mg total) by mouth 3 (three) times daily. Patient taking differently: Take 10-20 mg by mouth 3 (three) times daily. Pt takes two tablets (20 mg) in the morning and then one tablet mid-day and one tablet at night. 10/30/14  Yes Janece Canterbury, MD  [employer] VITAMIN B12 1000 MCG tablet Take 1 tablet by mouth daily. 08/22/14  Yes Historical Provider, MD  doxycycline (VIBRA-TABS) 100 MG tablet Take 100 mg by mouth every 12 (twelve) hours. continous 10/12/14  Yes Historical Provider, MD  DULoxetine (CYMBALTA) 30 MG capsule Take 30 mg by  mouth 2 (two) times daily. 01/06/15  Yes Historical Provider, MD  ferrous sulfate 325 (65 FE) MG tablet Take 1 tablet (325 mg total) by mouth 3 (three) times daily. 08/04/14  Yes Janece Canterbury, MD  gabapentin (NEURONTIN) 400 MG capsule Take 400 mg by mouth 3 (three) times daily. 10/18/14  Yes Historical Provider, MD  ibuprofen (ADVIL,MOTRIN) 200 MG tablet Take 400 mg by mouth every 6 (six) hours as  needed for moderate pain.   Yes Historical Provider, MD  midodrine (PROAMATINE) 2.5 MG tablet Take 2.5 mg by mouth 3 (three) times daily. 01/09/15  Yes Historical Provider, MD  oxycodone (OXY-IR) 5 MG capsule Take 5 mg by mouth every 4 (four) hours as needed for pain.   Yes Historical Provider, MD  silver sulfADIAZINE (SILVADENE) 1 % cream Apply 1 application topically every other day as needed (for wound care).   Yes Historical Provider, MD  tiZANidine (ZANAFLEX) 4 MG tablet Take 2 mg by mouth every 8 (eight) hours as needed. Muscle spasms. 12/13/14  Yes Historical Provider, MD  zinc oxide (BALMEX) 11.3 % CREA cream Apply 1 application topically 3 (three) times daily as needed (for skin redness/irritation).   Yes Historical Provider, MD     Allergies  Allergen Reactions  . Erythromycin Nausea And Vomiting  . Nitrofurantoin Monohyd Macro Nausea And Vomiting  . Penicillins Hives    Has patient had a PCN reaction causing immediate rash, facial/tongue/throat swelling, SOB or lightheadedness with hypotension: Yes Has patient had a PCN reaction causing severe rash involving mucus membranes or skin necrosis: Yes Has patient had a PCN reaction that required hospitalization Yes- went to the DR Has patient had a PCN reaction occurring within the last 10 years: No-childhood allergy If all of the above answers are "NO", then may proceed with Cephalosporin use.     Social History:  reports that she has been smoking Cigarettes.  She has a 25 pack-year smoking history. She does not have any smokeless tobacco history on file. She reports that she drinks alcohol. She reports that she does not use illicit drugs.    Family History  Problem Relation Age of Onset  . Hypertension Maternal Uncle        Physical Exam:  GEN:  Pleasant Thin  45 y.o.  Caucasian female examined and in no acute distress; cooperative with exam Filed Vitals:   01/31/15 2000 01/31/15 2005 01/31/15 2030 01/31/15 2100  BP: 93/67   95/61 93/61  Pulse: 98  95 93  Temp:      TempSrc:      Resp: 20  17 18   Weight:  42.185 kg (93 lb)    SpO2: 96%  97% 96%   Blood pressure 93/61, pulse 93, temperature 103 F (39.4 C), temperature source Oral, resp. rate 18, weight 42.185 kg (93 lb), SpO2 96 %. PSYCH: She is alert and oriented x4; does not appear anxious does not appear depressed; affect is normal HEENT: Normocephalic and Atraumatic, Mucous membranes pink; PERRLA; EOM intact; Fundi:  Benign;  No scleral icterus, Nares: Patent, Oropharynx: Clear, Edentulous on Upper Palate,    Neck:  FROM, No Cervical Lymphadenopathy nor Thyromegaly or Carotid Bruit; No JVD; Breasts:: Not examined CHEST WALL: No tenderness CHEST: Normal respiration, clear to auscultation bilaterally HEART: Regular rate and rhythm; no murmurs rubs or gallops BACK: No kyphosis or scoliosis; No CVA tenderness ABDOMEN: Positive Bowel Sounds, Scaphoid, Soft Non-Tender, No Rebound or Guarding; No Masses, No Organomegaly. Rectal Exam: Not done EXTREMITIES: +  contractures of hands, No Edema Genitalia: not examined PULSES: 2+ and symmetric SKIN: Normal hydration no rash, + stage IV Sacral and Ischial wounds CNS:  Alert and Oriented x 4, Quadriparesis Vascular: pulses palpable throughout    Labs on Admission:  Basic Metabolic Panel:  Recent Labs Lab 01/31/15 1728  NA 138  K 4.1  CL 97*  CO2 27  GLUCOSE 89  BUN 12  CREATININE 0.59  CALCIUM 9.6   Liver Function Tests:  Recent Labs Lab 01/31/15 1728  AST 16  ALT 9*  ALKPHOS 143*  BILITOT 0.3  PROT 7.8  ALBUMIN 2.6*   No results for input(s): LIPASE, AMYLASE in the last 168 hours. No results for input(s): AMMONIA in the last 168 hours. CBC:  Recent Labs Lab 01/31/15 1728  WBC 6.8  NEUTROABS 5.4  HGB 12.6  HCT 40.5  MCV 91.6  PLT 374   Cardiac Enzymes: No results for input(s): CKTOTAL, CKMB, CKMBINDEX, TROPONINI in the last 168 hours.  BNP (last 3 results) No results for  input(s): BNP in the last 8760 hours.  ProBNP (last 3 results) No results for input(s): PROBNP in the last 8760 hours.  CBG: No results for input(s): GLUCAP in the last 168 hours.  Radiological Exams on Admission: Dg Chest Port 1 View  01/31/2015  CLINICAL DATA:  Fever, chills, and cough for 3 days.  Asthma. EXAM: PORTABLE CHEST 1 VIEW COMPARISON:  10/29/2014 FINDINGS: Dual lead transvenous pacemaker remains in appropriate position. Heart size is normal. New asymmetric airspace opacity is seen in right lower lung, suspicious for pneumonia. Left lung is clear. No evidence of pneumothorax or pleural effusion. IMPRESSION: New right lower lung airspace disease, suspicious for pneumonia. Followup PA and lateral chest X-ray is recommended in 3-4 weeks following trial of antibiotic therapy to ensure resolution. Electronically Signed   By: Earle Gell M.D.   On: 01/31/2015 17:19     EKG: Independently reviewed. Sinus Tachycardia at 106    Assessment/Plan:     45 y.o. female with  Principal Problem:   1.    Sepsis (HCC)   IV Vancomycin, Levaquin, and Aztreonam   IVFs   Active Problems:   2.    RLL pneumonia   Abxs in #1     3.    Urinary tract infection, site not specified   Abxs in #1   Urine C+S sent     4.    Quadriplegia, C5-C7 incomplete (HCC)   Chronic     5.    Pressure ulcer of sacral region, stage 4 (HCC)   Wound Care Eval for Rx     6.    DVT Prophylaxis   Lovenox    Code Status:     FULL CODE        Family Communication:  No Family Present    Disposition Plan:    Inpatient Status        Time spent:  [facility] Pager [phone]   If [place] Please Contact the Day Rounding Team MD for [facility]  If 7PM-7AM, Please Contact Night-Floor Coverage  [email or web address] Password [email or web address] 01/31/2015, 9:16 PM     ADDENDUM:   Patient was seen and examined on 01/31/2015

## 2015-01-31 NOTE — ED Notes (Signed)
Elevated Lactic given to Dr Dayna Barker.

## 2015-01-31 NOTE — ED Notes (Signed)
Bed: [id number] Expected date:  Expected time:  Means of arrival:  Comments: Respiratory

## 2015-01-31 NOTE — ED Provider Notes (Signed)
CSN: [id number]     Arrival date & time 01/31/15  28 History   First MD Initiated Contact with Patient 01/31/15 1632     Chief Complaint  Patient presents with  . Shortness of Breath     (Consider location/radiation/quality/duration/timing/severity/associated sxs/prior Treatment) Patient is a 45 y.o. female presenting with shortness of breath.  Shortness of Breath Severity:  Mild Onset quality:  Gradual Duration:  3 days Timing:  Constant Progression:  Worsening Chronicity:  New Context: not activity   Relieved by:  None tried Worsened by:  Nothing tried Ineffective treatments:  None tried Associated symptoms: cough and fever   Associated symptoms: no abdominal pain and no chest pain     Past Medical History  Diagnosis Date  . Asthma   . Pleurisy   . GERD (gastroesophageal reflux disease)   . Quadriparesis ([place])   . Pacemaker    Past Surgical History  Procedure Laterality Date  . Appendectomy    . Cardiac surgery    . I&d extremity  10/28/2011    Procedure: IRRIGATION AND DEBRIDEMENT EXTREMITY;  Surgeon: Tennis Must, MD;  Location: [place];  Service: Orthopedics;  Laterality: Right;  Irrigation and debridement right middle finger   Family History  Problem Relation Age of Onset  . Hypertension Maternal Uncle    Social History  Substance Use Topics  . Smoking status: Current Every Day Smoker -- 1.00 packs/day for 25 years    Types: Cigarettes  . Smokeless tobacco: None  . Alcohol Use: Yes     Comment: 2 x weekly   OB History    No data available     Review of Systems  Constitutional: Positive for fever.  Respiratory: Positive for cough and shortness of breath.   Cardiovascular: Negative for chest pain.  Gastrointestinal: Negative for abdominal pain.  Skin: Negative for wound.  All other systems reviewed and are negative.     Allergies  Erythromycin; Nitrofurantoin monohyd macro; and Penicillins  Home Medications   Prior to  Admission medications   Medication Sig Start Date End Date Taking? Authorizing Provider  acetaminophen (TYLENOL) 325 MG tablet Take 2 tablets (650 mg total) by mouth every 6 (six) hours as needed for fever. 09/06/14  Yes Robbie Lis, MD  albuterol (PROVENTIL HFA;VENTOLIN HFA) 108 (90 BASE) MCG/ACT inhaler Inhale 2 puffs into the lungs every 6 (six) hours as needed. For shortness of breath    Yes Historical Provider, MD  baclofen (LIORESAL) 10 MG tablet Take 1 tablet (10 mg total) by mouth 3 (three) times daily. Patient taking differently: Take 10-20 mg by mouth 3 (three) times daily. Pt takes two tablets (20 mg) in the morning and then one tablet mid-day and one tablet at night. 10/30/14  Yes Janece Canterbury, MD  [employer] VITAMIN B12 1000 MCG tablet Take 1 tablet by mouth daily. 08/22/14  Yes Historical Provider, MD  doxycycline (VIBRA-TABS) 100 MG tablet Take 100 mg by mouth every 12 (twelve) hours. continous 10/12/14  Yes Historical Provider, MD  DULoxetine (CYMBALTA) 30 MG capsule Take 30 mg by mouth 2 (two) times daily. 01/06/15  Yes Historical Provider, MD  ferrous sulfate 325 (65 FE) MG tablet Take 1 tablet (325 mg total) by mouth 3 (three) times daily. 08/04/14  Yes Janece Canterbury, MD  gabapentin (NEURONTIN) 400 MG capsule Take 400 mg by mouth 3 (three) times daily. 10/18/14  Yes Historical Provider, MD  ibuprofen (ADVIL,MOTRIN) 200 MG tablet Take 400 mg by mouth every  6 (six) hours as needed for moderate pain.   Yes Historical Provider, MD  midodrine (PROAMATINE) 2.5 MG tablet Take 2.5 mg by mouth 3 (three) times daily. 01/09/15  Yes Historical Provider, MD  oxycodone (OXY-IR) 5 MG capsule Take 5 mg by mouth every 4 (four) hours as needed for pain.   Yes Historical Provider, MD  silver sulfADIAZINE (SILVADENE) 1 % cream Apply 1 application topically every other day as needed (for wound care).   Yes Historical Provider, MD  tiZANidine (ZANAFLEX) 4 MG tablet Take 2 mg by mouth every 8 (eight) hours as  needed. Muscle spasms. 12/13/14  Yes Historical Provider, MD  zinc oxide (BALMEX) 11.3 % CREA cream Apply 1 application topically 3 (three) times daily as needed (for skin redness/irritation).   Yes Historical Provider, MD   BP 101/56 mmHg  Pulse 90  Temp(Src) 101 F (38.3 C) (Oral)  Resp 16  Wt 93 lb (42.185 kg)  SpO2 94% Physical Exam  Constitutional: She is oriented to person, place, and time. She appears well-developed and well-nourished.  HENT:  Head: Normocephalic and atraumatic.  Neck: Normal range of motion.  Cardiovascular: Normal rate and regular rhythm.   Pulmonary/Chest: Effort normal. No stridor. No respiratory distress. She has wheezes.  Abdominal: Soft. She exhibits no distension. There is no tenderness.  Neurological: She is alert and oriented to person, place, and time. No cranial nerve deficit.  Skin: Skin is warm and dry.  Nursing note and vitals reviewed.   ED Course  Procedures (including critical care time)  CRITICAL CARE Performed by: Merrily Pew   Total critical care time: 30 minutes  Critical care time was exclusive of separately billable procedures and treating other patients.  Critical care was necessary to treat or prevent imminent or life-threatening deterioration.  Critical care was time spent personally by me on the following activities: development of treatment plan with patient and/or surrogate as well as nursing, discussions with consultants, evaluation of patient's response to treatment, examination of patient, obtaining history from patient or surrogate, ordering and performing treatments and interventions, ordering and review of laboratory studies, ordering and review of radiographic studies, pulse oximetry and re-evaluation of patient's condition.   Labs Review Labs Reviewed  COMPREHENSIVE METABOLIC PANEL - Abnormal; Notable for the following:    Chloride 97 (*)    Albumin 2.6 (*)    ALT 9 (*)    Alkaline Phosphatase 143 (*)    All  other components within normal limits  URINALYSIS, ROUTINE W REFLEX MICROSCOPIC (NOT AT [facility]) - Abnormal; Notable for the following:    Color, Urine RED (*)    APPearance TURBID (*)    Hgb urine dipstick LARGE (*)    Protein, ur 100 (*)    Leukocytes, UA LARGE (*)    All other components within normal limits  URINE MICROSCOPIC-ADD ON - Abnormal; Notable for the following:    Squamous Epithelial / LPF 6-30 (*)    Bacteria, UA MANY (*)    All other components within normal limits  I-STAT CG4 LACTIC ACID, ED - Abnormal; Notable for the following:    Lactic Acid, Venous 3.13 (*)    All other components within normal limits  CULTURE, BLOOD (ROUTINE X 2)  CULTURE, BLOOD (ROUTINE X 2)  URINE CULTURE  CBC WITH DIFFERENTIAL/PLATELET  PROCALCITONIN  PROTIME-INR  APTT  BASIC METABOLIC PANEL  CBC  LACTIC ACID, PLASMA  LACTIC ACID, PLASMA  I-STAT CG4 LACTIC ACID, ED    Imaging Review Dg  Chest Port 1 View  01/31/2015  CLINICAL DATA:  Fever, chills, and cough for 3 days.  Asthma. EXAM: PORTABLE CHEST 1 VIEW COMPARISON:  10/29/2014 FINDINGS: Dual lead transvenous pacemaker remains in appropriate position. Heart size is normal. New asymmetric airspace opacity is seen in right lower lung, suspicious for pneumonia. Left lung is clear. No evidence of pneumothorax or pleural effusion. IMPRESSION: New right lower lung airspace disease, suspicious for pneumonia. Followup PA and lateral chest X-ray is recommended in 3-4 weeks following trial of antibiotic therapy to ensure resolution. Electronically Signed   By: Earle Gell M.D.   On: 01/31/2015 17:19   I have personally reviewed and evaluated these images and lab results as part of my medical decision-making.   EKG Interpretation   Date/Time:  Wednesday January 31 2015 18:35:24 EST Ventricular Rate:  106 PR Interval:  130 QRS Duration: 110 QT Interval:  288 QTC Calculation: 382 R Axis:   81 Text Interpretation:  Sinus tachycardia Probable left  atrial enlargement  Artifact in lead(s) I II III aVR aVL aVF V2 Confirmed by Jacqueline Delapena MD, Corene Cornea  [phone]) on 01/31/2015 7:02:25 PM      MDM   Final diagnoses:  Sepsis, due to unspecified organism ([place])  RLL pneumonia  Urinary tract infection, site not specified     45 year old female with paraplegia presents to the emergency department today with source of breath similar to previous episodes of pneumonia. Found to be febrile so tachycardic and slightly tachypneic with some rales. Also complain of change in color or urine. Chest x-ray with  Evidence of new right lower lobe pneumonia. Urine is obviously infected. Started on vancomycin and Levaquin. Fluids started. Lactic acid slightly elevated. D/W hospitalist and will admit for the same.    Merrily Pew, MD 02/01/15 [phone]

## 2015-01-31 NOTE — ED Notes (Signed)
Per EMS, pt is coming from home with c/o fever, chills, and cough, and wheezing x 3 days. Per EMS, wheezing in all fields, 5mg  albuterol, .5 atrovent given en route by EMS. Pt has a hx of asthma, temp of 100.1 for EMS prior to their arrival, SpO2 95% RA.

## 2015-02-01 DIAGNOSIS — L89154 Pressure ulcer of sacral region, stage 4: Secondary | ICD-10-CM

## 2015-02-01 DIAGNOSIS — G8254 Quadriplegia, C5-C7 incomplete: Secondary | ICD-10-CM

## 2015-02-01 DIAGNOSIS — N390 Urinary tract infection, site not specified: Secondary | ICD-10-CM

## 2015-02-01 DIAGNOSIS — J189 Pneumonia, unspecified organism: Secondary | ICD-10-CM

## 2015-02-01 LAB — BASIC METABOLIC PANEL
ANION GAP: 8 (ref 5–15)
BUN: 9 mg/dL (ref 6–20)
CALCIUM: 8.2 mg/dL — AB (ref 8.9–10.3)
CO2: 25 mmol/L (ref 22–32)
Chloride: 105 mmol/L (ref 101–111)
Creatinine, Ser: 0.42 mg/dL — ABNORMAL LOW (ref 0.44–1.00)
Glucose, Bld: 171 mg/dL — ABNORMAL HIGH (ref 65–99)
Potassium: 3.8 mmol/L (ref 3.5–5.1)
Sodium: 138 mmol/L (ref 135–145)

## 2015-02-01 LAB — CBC
HCT: 32.2 % — ABNORMAL LOW (ref 36.0–46.0)
HEMOGLOBIN: 10.3 g/dL — AB (ref 12.0–15.0)
MCH: 29.1 pg (ref 26.0–34.0)
MCHC: 32 g/dL (ref 30.0–36.0)
MCV: 91 fL (ref 78.0–100.0)
PLATELETS: 311 10*3/uL (ref 150–400)
RBC: 3.54 MIL/uL — AB (ref 3.87–5.11)
RDW: 15.1 % (ref 11.5–15.5)
WBC: 4.5 10*3/uL (ref 4.0–10.5)

## 2015-02-01 LAB — LACTIC ACID, PLASMA
LACTIC ACID, VENOUS: 0.8 mmol/L (ref 0.5–2.0)
LACTIC ACID, VENOUS: 0.7 mmol/L (ref 0.5–2.0)

## 2015-02-01 MED ORDER — ENOXAPARIN SODIUM 30 MG/0.3ML ~~LOC~~ SOLN
30.0000 mg | Freq: Every day | SUBCUTANEOUS | Status: DC
  Administered 2015-02-01 – 2015-02-02 (×2): 30 mg via SUBCUTANEOUS
  Filled 2015-02-01 (×2): qty 0.3

## 2015-02-01 MED ORDER — AZTREONAM 1 G IJ SOLR
1.0000 g | Freq: Three times a day (TID) | INTRAMUSCULAR | Status: DC
  Administered 2015-02-01 – 2015-02-03 (×7): 1 g via INTRAVENOUS
  Filled 2015-02-01 (×10): qty 1

## 2015-02-01 NOTE — Clinical Documentation Improvement (Signed)
Hospitalist  A cause and effect relationship may not be assumed and must be documented by a provider.  Please clarify the relationship, if any, between chronic foley catheter and UTI.  Are the conditions:   Due to or associated with each other  Unrelated to each other  Other  Clinically Undetermined   Supporting Information (risk factors, sign and symptoms, diagnostics, treatment): Urinary tract infection, site not specified;  45 year old female with paraplegia presents to the emergency department   Also complain of change in color or urine. Urine is obviously infected per 02/01/15 ED note.    Patient with a chronic foley cath per 02/01/15 progress notes.   Please exercise your independent, professional judgment when responding. A specific answer is not anticipated or expected.   Thank You,  [place] [phone]

## 2015-02-01 NOTE — Consult Note (Signed)
WOC wound consult note Reason for Consult: Stage 4 pressure injury to sacrum, left upper buttocks and left ischial tuberosity. Patient lives at home and receives [facility] and family assist with wound care Wound type:Stage 4 pressure injury Pressure Ulcer POA: Yes Measurement:Sacrum: 7cm x 6.3 cm cm x 2cm.  Wound bed is pale  pink, moist, non-granulating and with small amount of serous exudate. Left upper buttock (Stage 4): 2cm x 3cm x 1cm with undermining at 12 o'clock measuring 1 cm Wound bed is red, moist with serous exudate. Left ischial tuberosity (stage4): 2.5cm x 2 cm x 1.6 cm with undermining noted at 10 o'clock measuring 1cm. Wound bed is light pink, non-granulating, scant exudate. Bilateral heels are intact with nonblanchable redness.  Did not bring heel protectors from home.  Will implement Prevalon boots.  Wound bed:As described above Drainage (amount, consistency, odor) Moderate serosanguinous drainage with musty odor.  Periwound: Erythema with MEdipore tape removal.  Skin is intact.  Dressing procedure/placement/frequency:Cleanse wounds to left ischium, left upper buttocks and sacrum with NS and pat gently dry.  Apply Aquacel Ag to wound bed, cover with 4x4 gauze and secure with ABD pad and Medipore tape.  Change daily.  Will not follow at this time.  Please re-consult if needed.  Domenic Moras RN BSN [place] Pager [phone]

## 2015-02-01 NOTE — Care Management Note (Signed)
Case Management Note  Patient Details  Name: Angela Page MRN: [id number] Date of Birth: [birth year]  Subjective/Objective: 45 y/o f admitted w/PNA. Hx: Quadriplegia, sacral decub. From home.                   Action/Plan:d/c plan home.   Expected Discharge Date:   (unknown)               Expected Discharge Plan:  Home/Self Care  In-House Referral:     Discharge planning Services  CM Consult  Post Acute Care Choice:    Choice offered to:     DME Arranged:    DME Agency:     HH Arranged:    HH Agency:     Status of Service:  In process, will continue to follow  Medicare Important Message Given:    Date Medicare IM Given:    Medicare IM give by:    Date Additional Medicare IM Given:    Additional Medicare Important Message give by:     If discussed at [place] of Stay Meetings, dates discussed:    Additional Comments:  Dessa Phi, RN 02/01/2015, 3:28 PM

## 2015-02-01 NOTE — Progress Notes (Signed)
[facility] PROGRESS NOTE  Shalonda Langen [id number] DOB: Mar 14, 1970 DOA: 01/31/2015 PCP: No PCP Per Patient Brief narrative 45 year old female with C5 quadriplegia since 2014, Azmacort stage IV sacral decubitus ulcer, severe malnutrition who presented to the ED with increased lethargy and poor responsiveness with shortness of breath. She also had cough with fever and chills for the past 3 days. Patient was febrile with temperature of 103F in the ED and was found to have right lower lung pneumonia on chest x-ray. Patient was also hypotensive and tachycardic meeting criteria for sepsis on admission.  Assessment/Plan: Sepsis secondary to right lower lobe pneumonia Admitted to telemetry. Empiric IV vancomycin, aztreonam and Levaquin. IV fluids. Follow blood culture Encephalopathy resolved.  UTI As seen on UA. Follow cultures. Has chronic Foley. On empiric antibiotics.  Sacral decubitus ulcer stage IV Appears uninfected. Wound care evaluation appreciated.  C5 quadriplegia Anterior baclofen and Zanaflex for muscle spasm.   History of asthma. Stable   DVT prophylaxis  Diet: Regular   Code Status: Full code  Family Communication: None at bedside  Disposition Plan: Home once clinically improved (possibly in the next 48 hours)  Consultants: None   Procedures: None  Antibiotics:  IV vancomycin, aztreonam and Levaquin  HPI/Subjective: Seen and examined. Reports feeling much better. Denies shortness of breath. Cough has improved as well.   Objective: Filed Vitals:   02/01/15 0500 02/01/15 1135  BP: 106/65 109/72  Pulse: 84 80  Temp: 99.1 F (37.3 C) 98.6 F (37 C)  Resp: 18 18    Intake/Output Summary (Last 24 hours) at 02/01/15 1339 Last data filed at 02/01/15 1138  Gross per 24 hour  Intake   1070 ml  Output   1700 ml  Net   -630 ml   Filed Weights   01/31/15 2005 01/31/15 2232  Weight: 42.185 kg (93 lb) 42.185 kg (93 lb)    Exam:   General:  He aged thin built female not in distress  HEENT: No pallor, moist mucosa  Chest: Scattered rhonchi bilaterally   Cardiovascular: Normal S1 and S2, no murmurs   GI: Soft, nondistended, nontender, chronic Foley   Musculoskeletal: Warm, no edema, quadriplegic  Data Reviewed: Basic Metabolic Panel:  Recent Labs Lab 01/31/15 1728 02/01/15 0152  NA 138 138  K 4.1 3.8  CL 97* 105  CO2 27 25  GLUCOSE 89 171*  BUN 12 9  CREATININE 0.59 0.42*  CALCIUM 9.6 8.2*   Liver Function Tests:  Recent Labs Lab 01/31/15 1728  AST 16  ALT 9*  ALKPHOS 143*  BILITOT 0.3  PROT 7.8  ALBUMIN 2.6*   No results for input(s): LIPASE, AMYLASE in the last 168 hours. No results for input(s): AMMONIA in the last 168 hours. CBC:  Recent Labs Lab 01/31/15 1728 02/01/15 0152  WBC 6.8 4.5  NEUTROABS 5.4  --   HGB 12.6 10.3*  HCT 40.5 32.2*  MCV 91.6 91.0  PLT 374 311   Cardiac Enzymes: No results for input(s): CKTOTAL, CKMB, CKMBINDEX, TROPONINI in the last 168 hours. BNP (last 3 results) No results for input(s): BNP in the last 8760 hours.  ProBNP (last 3 results) No results for input(s): PROBNP in the last 8760 hours.  CBG: No results for input(s): GLUCAP in the last 168 hours.  Recent Results (from the past 240 hour(s))  Blood Culture (routine x 2)     Status: None (Preliminary result)   Collection Time: 01/31/15  5:29 PM  Result Value Ref Range Status  Specimen Description BLOOD LEFT ANTECUBITAL  Final   Special Requests BOTTLES DRAWN AEROBIC AND ANAEROBIC 10 CC  Final   Culture   Final    NO GROWTH < 24 HOURS Performed at [facility]    Report Status PENDING  Incomplete  Urine culture     Status: None (Preliminary result)   Collection Time: 01/31/15  6:47 PM  Result Value Ref Range Status   Specimen Description URINE, CATHETERIZED  Final   Special Requests NONE  Final   Culture   Final    TOO YOUNG TO READ Performed at [facility]    Report Status  PENDING  Incomplete     Studies: Dg Chest Port 1 View  01/31/2015  CLINICAL DATA:  Fever, chills, and cough for 3 days.  Asthma. EXAM: PORTABLE CHEST 1 VIEW COMPARISON:  10/29/2014 FINDINGS: Dual lead transvenous pacemaker remains in appropriate position. Heart size is normal. New asymmetric airspace opacity is seen in right lower lung, suspicious for pneumonia. Left lung is clear. No evidence of pneumothorax or pleural effusion. IMPRESSION: New right lower lung airspace disease, suspicious for pneumonia. Followup PA and lateral chest X-ray is recommended in 3-4 weeks following trial of antibiotic therapy to ensure resolution. Electronically Signed   By: Earle Gell M.D.   On: 01/31/2015 17:19    Scheduled Meds: . aztreonam  1 g Intravenous 3 times per day  . baclofen  10 mg Oral 2 times per day  . baclofen  20 mg Oral Q breakfast  . DULoxetine  30 mg Oral BID  . enoxaparin (LOVENOX) injection  30 mg Subcutaneous QHS  . ferrous sulfate  325 mg Oral TID  . gabapentin  400 mg Oral TID  . ipratropium-albuterol  3 mL Nebulization Q4H  . levofloxacin (LEVAQUIN) IV  750 mg Intravenous Q24H  . midodrine  2.5 mg Oral TID AC  . sodium chloride  3 mL Intravenous Q12H  . vancomycin  500 mg Intravenous Q12H   Continuous Infusions: . sodium chloride 75 mL/hr at 01/31/15 2242      Time spent: 25 minutes    Aylee Littrell, [facility] Pager [phone] If 7PM-7AM, please contact night-coverage at [email or web address], password [facility] 02/01/2015, 1:39 PM  LOS: 1 day

## 2015-02-01 NOTE — Progress Notes (Signed)
Rx Brief note: Lovenox Rx adjusted Lovenox to 30mg  daily in pt with wt <45 kg  Thanks Dorrene German 02/01/2015 12:51 AM

## 2015-02-02 DIAGNOSIS — B952 Enterococcus as the cause of diseases classified elsewhere: Secondary | ICD-10-CM

## 2015-02-02 LAB — BASIC METABOLIC PANEL
ANION GAP: 7 (ref 5–15)
BUN: 8 mg/dL (ref 6–20)
CALCIUM: 8.2 mg/dL — AB (ref 8.9–10.3)
CO2: 24 mmol/L (ref 22–32)
CREATININE: 0.44 mg/dL (ref 0.44–1.00)
Chloride: 108 mmol/L (ref 101–111)
GFR calc Af Amer: >60 mL/min (ref >60)
GLUCOSE: 81 mg/dL (ref 65–99)
Potassium: 3.4 mmol/L — ABNORMAL LOW (ref 3.5–5.1)
Sodium: 139 mmol/L (ref 135–145)

## 2015-02-02 MED ORDER — IPRATROPIUM-ALBUTEROL 0.5-2.5 (3) MG/3ML IN SOLN
3.0000 mL | Freq: Four times a day (QID) | RESPIRATORY_TRACT | Status: DC
  Administered 2015-02-02 – 2015-02-03 (×3): 3 mL via RESPIRATORY_TRACT
  Filled 2015-02-02 (×3): qty 3

## 2015-02-02 MED ORDER — PRO-STAT SUGAR FREE PO LIQD
30.0000 mL | Freq: Two times a day (BID) | ORAL | Status: DC
  Administered 2015-02-02 – 2015-02-03 (×3): 30 mL via ORAL
  Filled 2015-02-02 (×3): qty 30

## 2015-02-02 NOTE — Progress Notes (Signed)
[facility] PROGRESS NOTE  Mahealani Benbrook [id number] DOB: 1970/10/19 DOA: 01/31/2015 PCP: No PCP Per Patient Brief narrative 45 year old female with C5 quadriplegia since 2014, Azmacort stage IV sacral decubitus ulcer, severe malnutrition who presented to the ED with increased lethargy and poor responsiveness with shortness of breath. She also had cough with fever and chills for the past 3 days. Patient was febrile with temperature of 103F in the ED and was found to have right lower lung pneumonia on chest x-ray. Patient was also hypotensive and tachycardic meeting criteria for sepsis on admission.  Assessment/Plan: Sepsis secondary to right lower lobe pneumonia and UTI Admitted to telemetry. Empiric IV vancomycin, aztreonam and Levaquin. IV fluids. Blood cx so far negative. Encephalopathy resolved.  Catheter associated enterococcal UTI Has chronic Foley catheter. Culture growing enterococcus. Although sensitivity.  Sacral decubitus ulcer stage IV Appears uninfected. Wound care evaluation appreciated.  C5 quadriplegia Continue baclofen and Zanaflex for muscle spasm.   History of asthma. Stable  Hypokalemia Replenished  Severe protein calorie malnutrition Added supplement   DVT prophylaxis  Diet: Regular   Code Status: Full code  Family Communication: None at bedside  Disposition Plan: Home tomorrow if hemodynamically stable and final cultures obtained.  Consultants: None   Procedures: None  Antibiotics:  IV vancomycin, aztreonam and Levaquin  HPI/Subjective: Seen and examined. Reports feeling much better. Remains afebrile.  Objective: Filed Vitals:   02/02/15 0946 02/02/15 1448  BP:  93/56  Pulse: 82 81  Temp:  98.2 F (36.8 C)  Resp: 16 16    Intake/Output Summary (Last 24 hours) at 02/02/15 1607 Last data filed at 02/02/15 1450  Gross per 24 hour  Intake   3210 ml  Output   2150 ml  Net   1060 ml   Filed Weights   01/31/15 2005  01/31/15 2232  Weight: 42.185 kg (93 lb) 42.185 kg (93 lb)    Exam:   General:  Middle aged thin built female not in distress  HEENT: , moist mucosa  Chest: Scattered rhonchi bilaterally   Cardiovascular: Normal S1 and S2, no murmurs   GI: Soft, nondistended, nontender, chronic Foley   Musculoskeletal: Warm, no edema, quadriplegic    Data Reviewed: Basic Metabolic Panel:  Recent Labs Lab 01/31/15 1728 02/01/15 0152 02/02/15 0835  NA 138 138 139  K 4.1 3.8 3.4*  CL 97* 105 108  CO2 27 25 24   GLUCOSE 89 171* 81  BUN 12 9 8   CREATININE 0.59 0.42* 0.44  CALCIUM 9.6 8.2* 8.2*   Liver Function Tests:  Recent Labs Lab 01/31/15 1728  AST 16  ALT 9*  ALKPHOS 143*  BILITOT 0.3  PROT 7.8  ALBUMIN 2.6*   No results for input(s): LIPASE, AMYLASE in the last 168 hours. No results for input(s): AMMONIA in the last 168 hours. CBC:  Recent Labs Lab 01/31/15 1728 02/01/15 0152  WBC 6.8 4.5  NEUTROABS 5.4  --   HGB 12.6 10.3*  HCT 40.5 32.2*  MCV 91.6 91.0  PLT 374 311   Cardiac Enzymes: No results for input(s): CKTOTAL, CKMB, CKMBINDEX, TROPONINI in the last 168 hours. BNP (last 3 results) No results for input(s): BNP in the last 8760 hours.  ProBNP (last 3 results) No results for input(s): PROBNP in the last 8760 hours.  CBG: No results for input(s): GLUCAP in the last 168 hours.  Recent Results (from the past 240 hour(s))  Blood Culture (routine x 2)     Status: None (Preliminary result)  Collection Time: 01/31/15  5:29 PM  Result Value Ref Range Status   Specimen Description BLOOD LEFT ANTECUBITAL  Final   Special Requests BOTTLES DRAWN AEROBIC AND ANAEROBIC 10 CC  Final   Culture   Final    NO GROWTH 2 DAYS Performed at [facility]    Report Status PENDING  Incomplete  Blood Culture (routine x 2)     Status: None (Preliminary result)   Collection Time: 01/31/15  5:54 PM  Result Value Ref Range Status   Specimen Description BLOOD  RIGHT HAND  Final   Special Requests BOTTLES DRAWN AEROBIC AND ANAEROBIC 10CC  Final   Culture   Final    NO GROWTH 1 DAY Performed at [facility]    Report Status PENDING  Incomplete  Urine culture     Status: None (Preliminary result)   Collection Time: 01/31/15  6:47 PM  Result Value Ref Range Status   Specimen Description URINE, CATHETERIZED  Final   Special Requests NONE  Final   Culture   Final    >=100,000 COLONIES/mL ENTEROCOCCUS SPECIES Performed at [facility]    Report Status PENDING  Incomplete     Studies: Dg Chest Port 1 View  01/31/2015  CLINICAL DATA:  Fever, chills, and cough for 3 days.  Asthma. EXAM: PORTABLE CHEST 1 VIEW COMPARISON:  10/29/2014 FINDINGS: Dual lead transvenous pacemaker remains in appropriate position. Heart size is normal. New asymmetric airspace opacity is seen in right lower lung, suspicious for pneumonia. Left lung is clear. No evidence of pneumothorax or pleural effusion. IMPRESSION: New right lower lung airspace disease, suspicious for pneumonia. Followup PA and lateral chest X-ray is recommended in 3-4 weeks following trial of antibiotic therapy to ensure resolution. Electronically Signed   By: Earle Gell M.D.   On: 01/31/2015 17:19    Scheduled Meds: . aztreonam  1 g Intravenous 3 times per day  . baclofen  10 mg Oral 2 times per day  . baclofen  20 mg Oral Q breakfast  . DULoxetine  30 mg Oral BID  . enoxaparin (LOVENOX) injection  30 mg Subcutaneous QHS  . feeding supplement (PRO-STAT SUGAR FREE 64)  30 mL Oral BID  . ferrous sulfate  325 mg Oral TID  . gabapentin  400 mg Oral TID  . ipratropium-albuterol  3 mL Nebulization QID  . levofloxacin (LEVAQUIN) IV  750 mg Intravenous Q24H  . midodrine  2.5 mg Oral TID AC  . sodium chloride  3 mL Intravenous Q12H  . vancomycin  500 mg Intravenous Q12H   Continuous Infusions: . sodium chloride 75 mL/hr at 02/02/15 0533      Time spent: 25 minutes    Ulysse Siemen,  [facility] Pager [phone] If 7PM-7AM, please contact night-coverage at [email or web address], password [facility] 02/02/2015, 4:07 PM  LOS: 2 days

## 2015-02-02 NOTE — Progress Notes (Signed)
Initial Nutrition Assessment  DOCUMENTATION CODES:   Severe malnutrition in context of chronic illness, Underweight  INTERVENTION:  - Will order 30 mL Prostat BID, each supplement provides 100 kcal and 15 grams of protein. - RD will continue to monitor for needs  NUTRITION DIAGNOSIS:   Increased nutrient needs related to wound healing as evidenced by estimated needs.  GOAL:   Patient will meet greater than or equal to 90% of their needs  MONITOR:   PO intake, Supplement acceptance, Weight trends, Labs, Skin, I & O's  REASON FOR ASSESSMENT:   Consult, Low Braden, Other (Comment) (Underweight BMI) Assessment of nutrition requirement/status  ASSESSMENT:   45 y.o. 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 Her temperature was 103 in the ED and she was found to have pneumonia on Chest X-ray and A UTI as well as an elevated lactic Acid Level and was placed on the Sepsis Protocol. She was started on IV Vancomycin Levaquin and Aztreonam and referred for admssion  Pt seen for consult, low Braden, and underweight BMI. She is quadriplegic and needs assistance with feeding during meals. Per chart review, pt ate 40% of lunch and 50% of dinner yesterday (1/12). Her mother was at bedside and feeding her lunch, which consisted of a hamburger and french fries, at time of RD visit. Pt states that she has a very good appetite today. Mom, who is pt's caregiver, states that for the 2 days PTA pt had a poor appetite and did not eat but that she is doing much better since admission. Pt denies any abdominal pain or nausea with PO intakes. She denies ever having chewing or swallowing difficulties.  Pt states that she feels her weight was stable PTA with no recent fluctuations. Per chart  review, pt has lost 17 lbs (15% body weight) in the past 6 months which is significant for time frame. Did not perform physical assessment as pt was eating lunch, but able to visualized severe muscle wasting to upper body. Of note, mom and pt are very similar in physical appearance and weight status may be more related to genetics rather than inadequate intakes at home.   Pt not meeting needs recently. Medications reviewed. Labs reviewed; K: 3.4 mmol/L, Ca: 8.2 mg/dL.   Diet Order:  Diet regular Room service appropriate?: Yes; Fluid consistency:: Thin  Skin:  Wound (see comment) (Stage 4 sacral pressure ulcer)  Last BM:  1/12  Height:   Ht Readings from Last 1 Encounters:  01/31/15 5\' 2"  (1.575 m)    Weight:   Wt Readings from Last 1 Encounters:  01/31/15 93 lb (42.185 kg)    Ideal Body Weight:  45 kg (kg)  BMI:  Body mass index is 17.01 kg/(m^2).  Estimated Nutritional Needs:   Kcal:  1300-1500  Protein:  65-75 grams  Fluid:  1.8-2 L/day  EDUCATION NEEDS:   No education needs identified at this time     Jarome Matin, RD, LDN Inpatient Clinical Dietitian Pager # [phone] After hours/weekend pager # [phone]

## 2015-02-03 DIAGNOSIS — B952 Enterococcus as the cause of diseases classified elsewhere: Secondary | ICD-10-CM | POA: Diagnosis present

## 2015-02-03 DIAGNOSIS — N390 Urinary tract infection, site not specified: Secondary | ICD-10-CM

## 2015-02-03 DIAGNOSIS — E876 Hypokalemia: Secondary | ICD-10-CM

## 2015-02-03 DIAGNOSIS — A419 Sepsis, unspecified organism: Secondary | ICD-10-CM

## 2015-02-03 DIAGNOSIS — E43 Unspecified severe protein-calorie malnutrition: Secondary | ICD-10-CM

## 2015-02-03 LAB — URINE CULTURE

## 2015-02-03 LAB — VANCOMYCIN, TROUGH: Vancomycin Tr: 15 ug/mL (ref 10.0–20.0)

## 2015-02-03 MED ORDER — LEVOFLOXACIN 750 MG PO TABS: 750.0000 mg | ORAL_TABLET | Freq: Every day | ORAL | Status: AC

## 2015-02-03 MED ORDER — PRO-STAT SUGAR FREE PO LIQD: 30.0000 mL | Freq: Two times a day (BID) | ORAL | Status: DC

## 2015-02-03 NOTE — Discharge Summary (Addendum)
Physician Discharge Summary  Angela Page [id number] DOB: 20-Jun-1970 DOA: 01/31/2015  PCP: follows at [facility] Admit date: 01/31/2015 Discharge date: 02/03/2015  Time spent: 35 minutes  Recommendations for Outpatient Follow-up:  D/c home with outpt PCP follow up.  Completes 7 days abx course on 02/08/2015  Discharge Diagnoses:  Principal Problem:   Sepsis ([place])   Active Problems:   Protein-calorie malnutrition, severe ([place])   Quadriplegia, C5-C7 incomplete ([place])   Hypokalemia   Pressure ulcer of sacral region, stage 4 ([place])   RLL pneumonia   Enterococcus UTI   Discharge Condition: fair  Diet recommendation: regular with supplements  Filed Weights   01/31/15 2005 01/31/15 2232  Weight: 42.185 kg (93 lb) 42.185 kg (93 lb)    History of present illness:  45 year old female with C5 quadriplegia since 2014, Azmacort stage IV sacral decubitus ulcer, severe malnutrition who presented to the ED with increased lethargy and poor responsiveness with shortness of breath. She also had cough with fever and chills for the past 3 days. Patient was febrile with temperature of 103F in the ED and was found to have right lower lung pneumonia on chest x-ray. Patient was also hypotensive and tachycardic meeting criteria for sepsis on admission.  Hospital Course:  Sepsis secondary to right lower lobe pneumonia and UTI Admitted to telemetry. Empiric IV vancomycin, aztreonam and Levaquin. IV fluids. Blood cx negative. Encephalopathy resolved. Discharge on oral levaquin to complete 7 day course of abx ( cover both PNA and UTI)  Catheter associated enterococcal UTI Has chronic Foley catheter. Culture growing enterococcus sensitive to quinolone.    Sacral decubitus ulcer stage IV Appears uninfected. Wound care evaluation appreciated.  C5 quadriplegia Continue baclofen and Zanaflex for muscle spasm.   History of asthma. Stable  Hypokalemia Replenished  Severe protein calorie  malnutrition Added supplement      Code Status: Full code  Family Communication: None at bedside  Disposition Plan: Home  Consultants: None   Procedures: None  Antibiotics:  IV vancomycin, aztreonam and Levaquin    Discharge Exam: Filed Vitals:   02/03/15 0544 02/03/15 0935  BP: 94/64 105/67  Pulse: 86 80  Temp: 98.2 F (36.8 C) 98 F (36.7 C)  Resp: 18 16     General: Middle aged thin built female not in distress  HEENT: , moist mucosa  Chest: Scattered rhonchi bilaterally   Cardiovascular: Normal S1 and S2, no murmurs   GI: Soft, nondistended, nontender, chronic Foley  Musculoskeletal: Warm, no edema, quadriplegic   CNS: alert and oriented   Discharge Instructions    Current Discharge Medication List    START taking these medications   Details  Amino Acids-Protein Hydrolys (FEEDING SUPPLEMENT, PRO-STAT SUGAR FREE 64,) LIQD Take 30 mLs by mouth 2 (two) times daily. Qty: 900 mL, Refills: 0    levofloxacin (LEVAQUIN) 750 MG tablet Take 1 tablet (750 mg total) by mouth daily. Qty: 5 tablet, Refills: 0      CONTINUE these medications which have NOT CHANGED   Details  acetaminophen (TYLENOL) 325 MG tablet Take 2 tablets (650 mg total) by mouth every 6 (six) hours as needed for fever. Qty: 30 tablet, Refills: 0    albuterol (PROVENTIL HFA;VENTOLIN HFA) 108 (90 BASE) MCG/ACT inhaler Inhale 2 puffs into the lungs every 6 (six) hours as needed. For shortness of breath     baclofen (LIORESAL) 10 MG tablet Take 1 tablet (10 mg total) by mouth 3 (three) times daily. Qty: 90 tablet, Refills: 0  [employer] VITAMIN B12 1000 MCG tablet Take 1 tablet by mouth daily. Refills: 3    doxycycline (VIBRA-TABS) 100 MG tablet Take 100 mg by mouth every 12 (twelve) hours. continous Refills: 3    DULoxetine (CYMBALTA) 30 MG capsule Take 30 mg by mouth 2 (two) times daily. Refills: 3    ferrous sulfate 325 (65 FE) MG tablet Take 1 tablet (325 mg total) by mouth  3 (three) times daily. Qty: 90 tablet, Refills: 0    gabapentin (NEURONTIN) 400 MG capsule Take 400 mg by mouth 3 (three) times daily. Refills: 5    ibuprofen (ADVIL,MOTRIN) 200 MG tablet Take 400 mg by mouth every 6 (six) hours as needed for moderate pain.    midodrine (PROAMATINE) 2.5 MG tablet Take 2.5 mg by mouth 3 (three) times daily.    oxycodone (OXY-IR) 5 MG capsule Take 5 mg by mouth every 4 (four) hours as needed for pain.    silver sulfADIAZINE (SILVADENE) 1 % cream Apply 1 application topically every other day as needed (for wound care).    tiZANidine (ZANAFLEX) 4 MG tablet Take 2 mg by mouth every 8 (eight) hours as needed. Muscle spasms. Refills: 3    zinc oxide (BALMEX) 11.3 % CREA cream Apply 1 application topically 3 (three) times daily as needed (for skin redness/irritation).       Allergies  Allergen Reactions  . Erythromycin Nausea And Vomiting  . Nitrofurantoin Monohyd Macro Nausea And Vomiting  . Penicillins Hives    Has patient had a PCN reaction causing immediate rash, facial/tongue/throat swelling, SOB or lightheadedness with hypotension: Yes Has patient had a PCN reaction causing severe rash involving mucus membranes or skin necrosis: Yes Has patient had a PCN reaction that required hospitalization Yes- went to the DR Has patient had a PCN reaction occurring within the last 10 years: No-childhood allergy If all of the above answers are "NO", then may proceed with Cephalosporin use.    Follow-up Information    Follow up In 1 week.   Why:  PCP at [facility] in 1 week       The results of significant diagnostics from this hospitalization (including imaging, microbiology, ancillary and laboratory) are listed below for reference.    Significant Diagnostic Studies: Dg Chest Port 1 View  01/31/2015  CLINICAL DATA:  Fever, chills, and cough for 3 days.  Asthma. EXAM: PORTABLE CHEST 1 VIEW COMPARISON:  10/29/2014 FINDINGS: Dual lead transvenous pacemaker  remains in appropriate position. Heart size is normal. New asymmetric airspace opacity is seen in right lower lung, suspicious for pneumonia. Left lung is clear. No evidence of pneumothorax or pleural effusion. IMPRESSION: New right lower lung airspace disease, suspicious for pneumonia. Followup PA and lateral chest X-ray is recommended in 3-4 weeks following trial of antibiotic therapy to ensure resolution. Electronically Signed   By: Earle Gell M.D.   On: 01/31/2015 17:19    Microbiology: Recent Results (from the past 240 hour(s))  Blood Culture (routine x 2)     Status: None (Preliminary result)   Collection Time: 01/31/15  5:29 PM  Result Value Ref Range Status   Specimen Description BLOOD LEFT ANTECUBITAL  Final   Special Requests BOTTLES DRAWN AEROBIC AND ANAEROBIC 10 CC  Final   Culture   Final    NO GROWTH 2 DAYS Performed at [facility]    Report Status PENDING  Incomplete  Blood Culture (routine x 2)     Status: None (Preliminary result)   Collection  Time: 01/31/15  5:54 PM  Result Value Ref Range Status   Specimen Description BLOOD RIGHT HAND  Final   Special Requests BOTTLES DRAWN AEROBIC AND ANAEROBIC 10CC  Final   Culture   Final    NO GROWTH 1 DAY Performed at [facility]    Report Status PENDING  Incomplete  Urine culture     Status: None (Preliminary result)   Collection Time: 01/31/15  6:47 PM  Result Value Ref Range Status   Specimen Description URINE, CATHETERIZED  Final   Special Requests NONE  Final   Culture   Final    >=100,000 COLONIES/mL ENTEROCOCCUS SPECIES Performed at [facility]    Report Status PENDING  Incomplete     Labs: Basic Metabolic Panel:  Recent Labs Lab 01/31/15 1728 02/01/15 0152 02/02/15 0835  NA 138 138 139  K 4.1 3.8 3.4*  CL 97* 105 108  CO2 27 25 24   GLUCOSE 89 171* 81  BUN 12 9 8   CREATININE 0.59 0.42* 0.44  CALCIUM 9.6 8.2* 8.2*   Liver Function Tests:  Recent Labs Lab 01/31/15 1728   AST 16  ALT 9*  ALKPHOS 143*  BILITOT 0.3  PROT 7.8  ALBUMIN 2.6*   No results for input(s): LIPASE, AMYLASE in the last 168 hours. No results for input(s): AMMONIA in the last 168 hours. CBC:  Recent Labs Lab 01/31/15 1728 02/01/15 0152  WBC 6.8 4.5  NEUTROABS 5.4  --   HGB 12.6 10.3*  HCT 40.5 32.2*  MCV 91.6 91.0  PLT 374 311   Cardiac Enzymes: No results for input(s): CKTOTAL, CKMB, CKMBINDEX, TROPONINI in the last 168 hours. BNP: BNP (last 3 results) No results for input(s): BNP in the last 8760 hours.  ProBNP (last 3 results) No results for input(s): PROBNP in the last 8760 hours.  CBG: No results for input(s): GLUCAP in the last 168 hours.     Signed:  Louellen Molder MD.  [facility] 02/03/2015, 11:35 AM

## 2015-02-03 NOTE — Progress Notes (Signed)
ANTIBIOTIC CONSULT NOTE - INITIAL  Pharmacy Consult for Vancomycin & Levaquin, Adding Aztreonam Indication: rule out pneumonia, rule out sepsis  Allergies  Allergen Reactions  . Erythromycin Nausea And Vomiting  . Nitrofurantoin Monohyd Macro Nausea And Vomiting  . Penicillins Hives    Has patient had a PCN reaction causing immediate rash, facial/tongue/throat swelling, SOB or lightheadedness with hypotension: Yes Has patient had a PCN reaction causing severe rash involving mucus membranes or skin necrosis: Yes Has patient had a PCN reaction that required hospitalization Yes- went to the DR Has patient had a PCN reaction occurring within the last 10 years: No-childhood allergy If all of the above answers are "NO", then may proceed with Cephalosporin use.    Patient Measurements: Height: 5\' 2"  (157.5 cm) Weight: 93 lb (42.185 kg) IBW/kg (Calculated) : 50.1 Total body weight: stated 93lb, 42.2kg  Vital Signs: Temp: 98 F (36.7 C) (01/14 0935) Temp Source: Oral (01/14 0935) BP: 105/67 mmHg (01/14 0935) Pulse Rate: 80 (01/14 0935) Intake/Output from previous day: 01/13 0701 - 01/14 0700 In: 2347.5 [P.O.:1440; I.V.:757.5; IV Piggyback:150] Out: 2625 [Urine:2625] Intake/Output from this shift: Total I/O In: 120 [P.O.:120] Out: 500 [Urine:500] Labs:  Recent Labs  01/31/15 1728 02/01/15 0152 02/02/15 0835  WBC 6.8 4.5  --   HGB 12.6 10.3*  --   PLT 374 311  --   CREATININE 0.59 0.42* 0.44   Estimated Creatinine Clearance: 59.8 mL/min (by C-G formula based on Cr of 0.44).  Recent Labs  02/03/15 0500  [place] 15   Microbiology: Recent Results (from the past 720 hour(s))  Blood Culture (routine x 2)     Status: None (Preliminary result)   Collection Time: 01/31/15  5:29 PM  Result Value Ref Range Status   Specimen Description BLOOD LEFT ANTECUBITAL  Final   Special Requests BOTTLES DRAWN AEROBIC AND ANAEROBIC 10 CC  Final   Culture   Final    NO GROWTH 2  DAYS Performed at [facility]    Report Status PENDING  Incomplete  Blood Culture (routine x 2)     Status: None (Preliminary result)   Collection Time: 01/31/15  5:54 PM  Result Value Ref Range Status   Specimen Description BLOOD RIGHT HAND  Final   Special Requests BOTTLES DRAWN AEROBIC AND ANAEROBIC 10CC  Final   Culture   Final    NO GROWTH 1 DAY Performed at [facility]    Report Status PENDING  Incomplete  Urine culture     Status: None (Preliminary result)   Collection Time: 01/31/15  6:47 PM  Result Value Ref Range Status   Specimen Description URINE, CATHETERIZED  Final   Special Requests NONE  Final   Culture   Final    >=100,000 COLONIES/mL ENTEROCOCCUS SPECIES Performed at [facility]    Report Status PENDING  Incomplete    Medical History: Past Medical History  Diagnosis Date  . Asthma   . Pleurisy   . GERD (gastroesophageal reflux disease)   . Quadriparesis ([place])   . Pacemaker    Medications:  Scheduled:  . aztreonam  1 g Intravenous 3 times per day  . baclofen  10 mg Oral 2 times per day  . baclofen  20 mg Oral Q breakfast  . DULoxetine  30 mg Oral BID  . enoxaparin (LOVENOX) injection  30 mg Subcutaneous QHS  . feeding supplement (PRO-STAT SUGAR FREE 64)  30 mL Oral BID  . ferrous sulfate  325 mg  Oral TID  . gabapentin  400 mg Oral TID  . ipratropium-albuterol  3 mL Nebulization QID  . levofloxacin (LEVAQUIN) IV  750 mg Intravenous Q24H  . midodrine  2.5 mg Oral TID AC  . sodium chloride  3 mL Intravenous Q12H  . vancomycin  500 mg Intravenous Q12H   Anti-infectives    Start     Dose/Rate Route Frequency Ordered Stop   02/01/15 2000  levofloxacin (LEVAQUIN) IVPB 750 mg     750 mg 100 mL/hr over 90 Minutes Intravenous Every 24 hours 01/31/15 2026     02/01/15 0715  aztreonam (AZACTAM) 1 g in dextrose 5 % 50 mL IVPB     1 g 100 mL/hr over 30 Minutes Intravenous 3 times per day 02/01/15 0714     02/01/15 0600  vancomycin  (VANCOCIN) 500 mg in sodium chloride 0.9 % 100 mL IVPB     500 mg 100 mL/hr over 60 Minutes Intravenous Every 12 hours 01/31/15 2025     01/31/15 1700  levofloxacin (LEVAQUIN) IVPB 750 mg     750 mg 100 mL/hr over 90 Minutes Intravenous  Once 01/31/15 1655 01/31/15 1954   01/31/15 1700  vancomycin (VANCOCIN) IVPB 1000 mg/200 mL premix     1,000 mg 200 mL/hr over 60 Minutes Intravenous  Once 01/31/15 1655 01/31/15 1924     Assessment: 34 yoF quadriplegic, to ED via EMS with fever, chills, cough and wheezing, hx of Asthma. Vancomycin and Levaquin begun in ED, further doses per Pharmacy. Added Aztreonam. CXray possible PNA  Today 02/03/2015 Antimicrobials this admission: PTA chronic doxycycline (stage IV pressure ulcer) >> 1/11 1/11 Vancomycin  >>  1/11 Levaquin  >>  1/11 Aztreonam >>  1/14 0500 VT = 15 on 500 q12h (prior to 6th total dose)  Microbiology Results: 1/11 BCx 2: NGTD 1/11 UCx: >100k Enterococcus (sens pending)   Goal of Therapy:  Vancomycin trough level 15-20 mcg/ml  Plan:   Vancomycin trough in range on 500mg  q12  Levofloxacin 750mg  IV x1, followed by Levofloxacin 750mg  q24  Aztreonam 1gm q8hr  Pending Enterococcus sensitivities: Levaquin an option besides Vancomycin   Minda Ditto PharmD Pager [phone] 02/03/2015, 10:21 AM

## 2015-02-03 NOTE — Clinical Social Work Note (Signed)
CSW received a call from unit RN regarding transportation for pt. CSW contacted [facility] for transportation home. Transportation form, facesheet, and prescriptions were left with the Financial controller.    Cindra Presume, LCSW [phone] [facility], 5W [id number] Licensed Clinical Social Worker

## 2015-02-05 LAB — CULTURE, BLOOD (ROUTINE X 2): CULTURE: NO GROWTH

## 2015-02-06 LAB — CULTURE, BLOOD (ROUTINE X 2): Culture: NO GROWTH

## 2015-02-25 ENCOUNTER — Emergency Department (HOSPITAL_COMMUNITY): Payer: Medicaid Other

## 2015-02-25 ENCOUNTER — Encounter (HOSPITAL_COMMUNITY): Payer: Self-pay | Admitting: Emergency Medicine

## 2015-02-25 ENCOUNTER — Inpatient Hospital Stay (HOSPITAL_COMMUNITY)
Admission: EM | Admit: 2015-02-25 | Discharge: 2015-03-01 | DRG: 193 | Disposition: A | Discharge status: Home Health | Payer: Medicaid Other | Attending: Internal Medicine | Admitting: Internal Medicine

## 2015-02-25 DIAGNOSIS — L89223 Pressure ulcer of left hip, stage 3: Secondary | ICD-10-CM | POA: Diagnosis present

## 2015-02-25 DIAGNOSIS — R0602 Shortness of breath: Secondary | ICD-10-CM

## 2015-02-25 DIAGNOSIS — Z881 Allergy status to other antibiotic agents status: Secondary | ICD-10-CM

## 2015-02-25 DIAGNOSIS — Z87891 Personal history of nicotine dependence: Secondary | ICD-10-CM

## 2015-02-25 DIAGNOSIS — L89893 Pressure ulcer of other site, stage 3: Secondary | ICD-10-CM | POA: Diagnosis present

## 2015-02-25 DIAGNOSIS — Z79899 Other long term (current) drug therapy: Secondary | ICD-10-CM | POA: Diagnosis not present

## 2015-02-25 DIAGNOSIS — Z8249 Family history of ischemic heart disease and other diseases of the circulatory system: Secondary | ICD-10-CM | POA: Diagnosis not present

## 2015-02-25 DIAGNOSIS — D649 Anemia, unspecified: Secondary | ICD-10-CM | POA: Diagnosis present

## 2015-02-25 DIAGNOSIS — E43 Unspecified severe protein-calorie malnutrition: Secondary | ICD-10-CM | POA: Diagnosis present

## 2015-02-25 DIAGNOSIS — L89154 Pressure ulcer of sacral region, stage 4: Secondary | ICD-10-CM | POA: Diagnosis present

## 2015-02-25 DIAGNOSIS — Z950 Presence of cardiac pacemaker: Secondary | ICD-10-CM | POA: Diagnosis not present

## 2015-02-25 DIAGNOSIS — J45909 Unspecified asthma, uncomplicated: Secondary | ICD-10-CM | POA: Diagnosis present

## 2015-02-25 DIAGNOSIS — R059 Cough, unspecified: Secondary | ICD-10-CM

## 2015-02-25 DIAGNOSIS — K219 Gastro-esophageal reflux disease without esophagitis: Secondary | ICD-10-CM | POA: Diagnosis present

## 2015-02-25 DIAGNOSIS — I9589 Other hypotension: Secondary | ICD-10-CM | POA: Diagnosis present

## 2015-02-25 DIAGNOSIS — Z23 Encounter for immunization: Secondary | ICD-10-CM | POA: Diagnosis not present

## 2015-02-25 DIAGNOSIS — Y95 Nosocomial condition: Secondary | ICD-10-CM | POA: Diagnosis present

## 2015-02-25 DIAGNOSIS — I959 Hypotension, unspecified: Secondary | ICD-10-CM

## 2015-02-25 DIAGNOSIS — G8254 Quadriplegia, C5-C7 incomplete: Secondary | ICD-10-CM | POA: Diagnosis present

## 2015-02-25 DIAGNOSIS — J189 Pneumonia, unspecified organism: Secondary | ICD-10-CM | POA: Diagnosis not present

## 2015-02-25 DIAGNOSIS — L8990 Pressure ulcer of unspecified site, unspecified stage: Secondary | ICD-10-CM | POA: Diagnosis not present

## 2015-02-25 DIAGNOSIS — Z681 Body mass index (BMI) 19 or less, adult: Secondary | ICD-10-CM | POA: Diagnosis not present

## 2015-02-25 DIAGNOSIS — N390 Urinary tract infection, site not specified: Secondary | ICD-10-CM | POA: Diagnosis present

## 2015-02-25 DIAGNOSIS — R509 Fever, unspecified: Secondary | ICD-10-CM | POA: Diagnosis present

## 2015-02-25 DIAGNOSIS — G8250 Quadriplegia, unspecified: Secondary | ICD-10-CM | POA: Diagnosis present

## 2015-02-25 DIAGNOSIS — Z880 Allergy status to penicillin: Secondary | ICD-10-CM

## 2015-02-25 DIAGNOSIS — R05 Cough: Secondary | ICD-10-CM

## 2015-02-25 DIAGNOSIS — L8944 Pressure ulcer of contiguous site of back, buttock and hip, stage 4: Secondary | ICD-10-CM | POA: Insufficient documentation

## 2015-02-25 LAB — CBC WITH DIFFERENTIAL/PLATELET
BASOS ABS: 0 10*3/uL (ref 0.0–0.1)
Basophils Relative: 0 %
EOS PCT: 2 %
Eosinophils Absolute: 0.2 10*3/uL (ref 0.0–0.7)
HEMATOCRIT: 35.3 % — AB (ref 36.0–46.0)
Hemoglobin: 11.5 g/dL — ABNORMAL LOW (ref 12.0–15.0)
LYMPHS ABS: 1.6 10*3/uL (ref 0.7–4.0)
Lymphocytes Relative: 15 %
MCH: 29.1 pg (ref 26.0–34.0)
MCHC: 32.6 g/dL (ref 30.0–36.0)
MCV: 89.4 fL (ref 78.0–100.0)
MONO ABS: 0.8 10*3/uL (ref 0.1–1.0)
MONOS PCT: 7 %
NEUTROS ABS: 8.2 10*3/uL — AB (ref 1.7–7.7)
Neutrophils Relative %: 76 %
PLATELETS: 473 10*3/uL — AB (ref 150–400)
RBC: 3.95 MIL/uL (ref 3.87–5.11)
RDW: 15.8 % — AB (ref 11.5–15.5)
WBC: 10.8 10*3/uL — AB (ref 4.0–10.5)

## 2015-02-25 LAB — URINALYSIS, ROUTINE W REFLEX MICROSCOPIC
BILIRUBIN URINE: NEGATIVE
GLUCOSE, UA: NEGATIVE mg/dL
Ketones, ur: NEGATIVE mg/dL
Nitrite: NEGATIVE
PH: 6 (ref 5.0–8.0)
Protein, ur: NEGATIVE mg/dL
SPECIFIC GRAVITY, URINE: 1.009 (ref 1.005–1.030)

## 2015-02-25 LAB — I-STAT TROPONIN, ED: Troponin i, poc: 0 ng/mL (ref 0.00–0.08)

## 2015-02-25 LAB — COMPREHENSIVE METABOLIC PANEL
ALK PHOS: 132 U/L — AB (ref 38–126)
ALT: 7 U/L — AB (ref 14–54)
AST: 18 U/L (ref 15–41)
Albumin: 2.6 g/dL — ABNORMAL LOW (ref 3.5–5.0)
Anion gap: 10 (ref 5–15)
BILIRUBIN TOTAL: 1.5 mg/dL — AB (ref 0.3–1.2)
BUN: 12 mg/dL (ref 6–20)
CALCIUM: 8.9 mg/dL (ref 8.9–10.3)
CO2: 25 mmol/L (ref 22–32)
Chloride: 100 mmol/L — ABNORMAL LOW (ref 101–111)
Creatinine, Ser: 0.51 mg/dL (ref 0.44–1.00)
GFR calc Af Amer: >60 mL/min (ref >60)
GFR calc non Af Amer: >60 mL/min (ref >60)
GLUCOSE: 77 mg/dL (ref 65–99)
POTASSIUM: 5.1 mmol/L (ref 3.5–5.1)
SODIUM: 135 mmol/L (ref 135–145)
TOTAL PROTEIN: 6.9 g/dL (ref 6.5–8.1)

## 2015-02-25 LAB — URINE MICROSCOPIC-ADD ON

## 2015-02-25 LAB — I-STAT CG4 LACTIC ACID, ED
LACTIC ACID, VENOUS: 0.97 mmol/L (ref 0.5–2.0)
LACTIC ACID, VENOUS: 0.42 mmol/L — AB (ref 0.5–2.0)

## 2015-02-25 MED ORDER — ACETAMINOPHEN 325 MG PO TABS: 650.0000 mg | ORAL_TABLET | Freq: Four times a day (QID) | ORAL | Status: DC | PRN

## 2015-02-25 MED ORDER — SILVER SULFADIAZINE 1 % EX CREA
1.0000 "application " | TOPICAL_CREAM | CUTANEOUS | Status: DC | PRN
  Filled 2015-02-25: qty 85

## 2015-02-25 MED ORDER — SENNOSIDES-DOCUSATE SODIUM 8.6-50 MG PO TABS: 1.0000 | ORAL_TABLET | Freq: Every evening | ORAL | Status: DC | PRN

## 2015-02-25 MED ORDER — VANCOMYCIN HCL 500 MG IV SOLR
500.0000 mg | Freq: Two times a day (BID) | INTRAVENOUS | Status: DC
  Administered 2015-02-26 – 2015-02-27 (×3): 500 mg via INTRAVENOUS
  Filled 2015-02-25 (×4): qty 500

## 2015-02-25 MED ORDER — BISACODYL 5 MG PO TBEC: 5.0000 mg | DELAYED_RELEASE_TABLET | Freq: Every day | ORAL | Status: DC | PRN

## 2015-02-25 MED ORDER — MIDODRINE HCL 5 MG PO TABS
5.0000 mg | ORAL_TABLET | Freq: Three times a day (TID) | ORAL | Status: DC
  Administered 2015-02-26 (×2): 5 mg via ORAL
  Filled 2015-02-25 (×4): qty 1

## 2015-02-25 MED ORDER — VANCOMYCIN HCL IN DEXTROSE 1-5 GM/200ML-% IV SOLN
1000.0000 mg | Freq: Once | INTRAVENOUS | Status: AC
  Administered 2015-02-25: 1000 mg via INTRAVENOUS
  Filled 2015-02-25 (×2): qty 200

## 2015-02-25 MED ORDER — VITAMIN B-12 1000 MCG PO TABS
1000.0000 ug | ORAL_TABLET | Freq: Every day | ORAL | Status: DC
  Administered 2015-02-26 – 2015-03-01 (×4): 1000 ug via ORAL
  Filled 2015-02-25 (×6): qty 1

## 2015-02-25 MED ORDER — DOCUSATE SODIUM 100 MG PO CAPS
100.0000 mg | ORAL_CAPSULE | Freq: Two times a day (BID) | ORAL | Status: DC
  Administered 2015-02-25 – 2015-03-01 (×7): 100 mg via ORAL
  Filled 2015-02-25 (×7): qty 1

## 2015-02-25 MED ORDER — SODIUM CHLORIDE 0.9 % IV SOLN
INTRAVENOUS | Status: DC
  Administered 2015-02-25: 50 mL via INTRAVENOUS
  Administered 2015-02-26: 17:00:00 via INTRAVENOUS

## 2015-02-25 MED ORDER — DEXTROSE 5 % IV SOLN
1.0000 g | Freq: Three times a day (TID) | INTRAVENOUS | Status: DC
  Administered 2015-02-25 – 2015-03-01 (×12): 1 g via INTRAVENOUS
  Filled 2015-02-25 (×13): qty 1

## 2015-02-25 MED ORDER — GUAIFENESIN ER 600 MG PO TB12
1200.0000 mg | ORAL_TABLET | Freq: Two times a day (BID) | ORAL | Status: DC
  Administered 2015-02-25 – 2015-02-27 (×5): 1200 mg via ORAL
  Filled 2015-02-25 (×5): qty 2

## 2015-02-25 MED ORDER — DULOXETINE HCL 30 MG PO CPEP
30.0000 mg | ORAL_CAPSULE | Freq: Two times a day (BID) | ORAL | Status: DC
  Administered 2015-02-25 – 2015-03-01 (×8): 30 mg via ORAL
  Filled 2015-02-25 (×8): qty 1

## 2015-02-25 MED ORDER — BACLOFEN 10 MG PO TABS
10.0000 mg | ORAL_TABLET | Freq: Three times a day (TID) | ORAL | Status: DC
Start: 2015-02-25 — End: 2015-02-26
  Administered 2015-02-25 – 2015-02-26 (×2): 10 mg via ORAL
  Filled 2015-02-25 (×2): qty 1

## 2015-02-25 MED ORDER — SODIUM CHLORIDE 0.9 % IV BOLUS (SEPSIS)
1000.0000 mL | Freq: Once | INTRAVENOUS | Status: AC
  Administered 2015-02-25: 1000 mL via INTRAVENOUS

## 2015-02-25 MED ORDER — ENOXAPARIN SODIUM 30 MG/0.3ML ~~LOC~~ SOLN
30.0000 mg | SUBCUTANEOUS | Status: DC
  Administered 2015-02-25 – 2015-02-28 (×4): 30 mg via SUBCUTANEOUS
  Filled 2015-02-25 (×4): qty 0.3

## 2015-02-25 MED ORDER — OXYCODONE HCL 5 MG PO TABS
5.0000 mg | ORAL_TABLET | ORAL | Status: DC | PRN
  Administered 2015-02-25 – 2015-03-01 (×19): 5 mg via ORAL
  Filled 2015-02-25 (×19): qty 1

## 2015-02-25 MED ORDER — ALBUTEROL SULFATE (2.5 MG/3ML) 0.083% IN NEBU
2.5000 mg | INHALATION_SOLUTION | RESPIRATORY_TRACT | Status: DC | PRN
  Administered 2015-02-26: 2.5 mg via RESPIRATORY_TRACT
  Filled 2015-02-25: qty 3

## 2015-02-25 MED ORDER — TIZANIDINE HCL 4 MG PO TABS
4.0000 mg | ORAL_TABLET | Freq: Three times a day (TID) | ORAL | Status: DC | PRN
  Filled 2015-02-25: qty 1

## 2015-02-25 MED ORDER — FERROUS SULFATE 325 (65 FE) MG PO TABS
325.0000 mg | ORAL_TABLET | Freq: Three times a day (TID) | ORAL | Status: DC
  Administered 2015-02-26 – 2015-03-01 (×11): 325 mg via ORAL
  Filled 2015-02-25 (×11): qty 1

## 2015-02-25 MED ORDER — ZINC OXIDE 11.3 % EX CREA
1.0000 "application " | TOPICAL_CREAM | Freq: Three times a day (TID) | CUTANEOUS | Status: DC | PRN
  Filled 2015-02-25: qty 56

## 2015-02-25 MED ORDER — ONDANSETRON HCL 4 MG PO TABS: 4.0000 mg | ORAL_TABLET | Freq: Four times a day (QID) | ORAL | Status: DC | PRN

## 2015-02-25 MED ORDER — ENSURE ENLIVE PO LIQD
237.0000 mL | Freq: Two times a day (BID) | ORAL | Status: DC
  Administered 2015-02-26: 237 mL via ORAL

## 2015-02-25 MED ORDER — GABAPENTIN 400 MG PO CAPS
400.0000 mg | ORAL_CAPSULE | Freq: Three times a day (TID) | ORAL | Status: DC
  Administered 2015-02-25 – 2015-03-01 (×11): 400 mg via ORAL
  Filled 2015-02-25 (×11): qty 1

## 2015-02-25 MED ORDER — ONDANSETRON HCL 4 MG/2ML IJ SOLN: 4.0000 mg | Freq: Four times a day (QID) | INTRAMUSCULAR | Status: DC | PRN

## 2015-02-25 NOTE — Progress Notes (Signed)
[place] for Vancomycin & Cefepime Indication: pneumonia  Allergies  Allergen Reactions  . Erythromycin Nausea And Vomiting  . Nitrofurantoin Monohyd Macro Nausea And Vomiting  . Penicillins Hives    Has patient had a PCN reaction causing immediate rash, facial/tongue/throat swelling, SOB or lightheadedness with hypotension: Yes Has patient had a PCN reaction causing severe rash involving mucus membranes or skin necrosis: Yes Has patient had a PCN reaction that required hospitalization Yes- went to the DR Has patient had a PCN reaction occurring within the last 10 years: No-childhood allergy If all of the above answers are "NO", then may proceed with Cephalosporin use.    Patient Measurements:   Total body weight: 42.2 kg  Vital Signs: Temp: 99.1 F (37.3 C) (02/05 1356) Temp Source: Oral (02/05 1356) BP: 92/54 mmHg (02/05 1620) Pulse Rate: 80 (02/05 1620) Intake/Output from previous day:   Intake/Output from this shift:    Labs:  Recent Labs  02/25/15 1510  WBC 10.8*  HGB 11.5*  PLT 473*  CREATININE 0.51   CrCl cannot be calculated (Unknown ideal weight.). No results for input(s): VANCOTROUGH, VANCOPEAK, VANCORANDOM, GENTTROUGH, GENTPEAK, GENTRANDOM, TOBRATROUGH, TOBRAPEAK, TOBRARND, AMIKACINPEAK, AMIKACINTROU, AMIKACIN in the last 72 hours.   Microbiology: Recent Results (from the past 720 hour(s))  Blood Culture (routine x 2)     Status: None   Collection Time: 01/31/15  5:29 PM  Result Value Ref Range Status   Specimen Description BLOOD LEFT ANTECUBITAL  Final   Special Requests BOTTLES DRAWN AEROBIC AND ANAEROBIC 10 CC  Final   Culture   Final    NO GROWTH 5 DAYS Performed at [facility]    Report Status 02/05/2015 FINAL  Final  Blood Culture (routine x 2)     Status: None   Collection Time: 01/31/15  5:54 PM  Result Value Ref Range Status   Specimen Description BLOOD RIGHT HAND  Final   Special Requests  BOTTLES DRAWN AEROBIC AND ANAEROBIC 10CC  Final   Culture   Final    NO GROWTH 5 DAYS Performed at [facility]    Report Status 02/06/2015 FINAL  Final  Urine culture     Status: None   Collection Time: 01/31/15  6:47 PM  Result Value Ref Range Status   Specimen Description URINE, CATHETERIZED  Final   Special Requests NONE  Final   Culture   Final    >=100,000 COLONIES/mL ENTEROCOCCUS SPECIES Performed at [facility]    Report Status 02/03/2015 FINAL  Final   Organism ID, Bacteria ENTEROCOCCUS SPECIES  Final      Susceptibility   Enterococcus species - MIC*    AMPICILLIN <=2 SENSITIVE Sensitive     LEVOFLOXACIN 1 SENSITIVE Sensitive     NITROFURANTOIN <=16 SENSITIVE Sensitive     VANCOMYCIN 1 SENSITIVE Sensitive     * >=100,000 COLONIES/mL ENTEROCOCCUS SPECIES   Medical History: Past Medical History  Diagnosis Date  . Asthma   . Pleurisy   . GERD (gastroesophageal reflux disease)   . Quadriparesis ([place])   . Pacemaker    Medications:  Scheduled:  Anti-infectives    None     Assessment: 38 yoF, quadriplegic, to ED with 2 day hx of fever, congestion. CXray RLL atalectasis, with superimposed consolidation, likely infectious process. Begin Vancomycin & Cefepime; r/o HCAP  Goal of Therapy:  Vancomycin trough level 15-20 mcg/ml  Plan:   Vancomycin 1gm x1 then 500mg  q12  Cefepime 1gm q8hr  Vanc trough at steady state if necessary  Minda Ditto PharmD Pager [phone] 02/25/2015, 5:58 PM

## 2015-02-25 NOTE — H&P (Addendum)
Patient Demographics  Angela Page, is a 45 y.o. female  MRN: [id number]   DOB - [birth year]  Admit Date - 02/25/2015  Outpatient Primary MD for the patient is No PCP Per Patient   With History of -  Past Medical History  Diagnosis Date  . Asthma   . Pleurisy   . GERD (gastroesophageal reflux disease)   . Quadriparesis ([place])   . Pacemaker       Past Surgical History  Procedure Laterality Date  . Appendectomy    . Cardiac surgery    . I&d extremity  10/28/2011    Procedure: IRRIGATION AND DEBRIDEMENT EXTREMITY;  Surgeon: Tennis Must, MD;  Location: [facility];  Service: Orthopedics;  Laterality: Right;  Irrigation and debridement right middle finger    in for   Chief Complaint  Patient presents with  . Fever  . Nasal Congestion     HPI  Angela Page  is a 45 y.o. female,with history of quadriparesis, pacemaker placement, asthma, autonomic dysfunction with hypotension, and stage 4 sacral decubitus ulcer, pressure  on chronic doxycycline, and with recent hospitalization for healthcare acquired pneumonia, treated with IV antibiotic, discharged on by mouth levofloxacin, she was still complaining of cough and congestion and productive yellow phlegm, seen by her PCP, who prescribed her another 10 days of by mouth levofloxacin, which she has finished yesterday, but still has her complaints of cough, productive, congestion and fever, in ED chest x-ray significant for pneumonia, workup significant for UTI, afebrile, no leukocytosis, denies nausea vomiting diarrhea or abdominal pain, hypotensive in ED, which is chronic for her, asymptomatic,I was called to admit.  Review of Systems    In addition to the HPI above,  reports fever. No Headache, No changes with Vision or hearing, No problems swallowing food or Liquids, No Chest pain,  and planes of cough, productive. No Abdominal pain, No Nausea or Vommitting, Bowel movements are regular, No Blood in stool  or Urine, No dysuria, No new skin rashes or bruises, with known pressure ulcer No new joints pains-aches,  No new weakness, tingling, numbness in any extremity, patient quadriplegic at baseline No recent weight gain or loss, No polyuria, polydypsia or polyphagia, No significant Mental Stressors.  A full 10 point Review of Systems was done, except as stated above, all other Review of Systems were negative.   Social History Social History  Substance Use Topics  . Smoking status: Former Smoker -- 1.00 packs/day for 25 years    Types: Cigarettes  . Smokeless tobacco: Not on file  . Alcohol Use: Yes     Comment: 2 x weekly    Family History Family History  Problem Relation Age of Onset  . Hypertension Maternal Uncle      Prior to Admission medications   Medication Sig Start Date End Date Taking? Authorizing Provider  albuterol (PROVENTIL HFA;VENTOLIN HFA) 108 (90 BASE) MCG/ACT inhaler Inhale 2 puffs into the lungs every 6 (six) hours as needed. For shortness of breath    Yes Historical Provider, MD  baclofen (LIORESAL) 10 MG tablet Take 1 tablet (10 mg total) by mouth 3 (three) times daily. Patient taking differently: Take 10-20 mg by mouth 3 (three) times daily. Pt takes two tablets (20 mg) in the morning and then one tablet mid-day and one tablet at night. 10/30/14  Yes Janece Canterbury, MD  [employer] VITAMIN B12 1000 MCG tablet Take 1 tablet by mouth daily. 08/22/14  Yes Historical Provider, MD  doxycycline (VIBRA-TABS) 100 MG tablet Take 100 mg by mouth every 12 (twelve) hours. continous 10/12/14  Yes Historical Provider, MD  DULoxetine (CYMBALTA) 30 MG capsule Take 30 mg by mouth 2 (two) times daily. 01/06/15  Yes Historical Provider, MD  ferrous sulfate 325 (65 FE) MG tablet Take 1 tablet (325 mg total) by mouth 3 (three) times daily. 08/04/14  Yes Janece Canterbury, MD  gabapentin (NEURONTIN) 400 MG capsule Take 400 mg by mouth 3 (three) times daily. 10/18/14  Yes Historical Provider, MD    ibuprofen (ADVIL,MOTRIN) 200 MG tablet Take 400 mg by mouth every 6 (six) hours as needed for moderate pain.   Yes Historical Provider, MD  midodrine (PROAMATINE) 5 MG tablet Take 5 mg by mouth 3 (three) times daily with meals.   Yes Historical Provider, MD  oxycodone (OXY-IR) 5 MG capsule Take 5 mg by mouth every 4 (four) hours as needed for pain.   Yes Historical Provider, MD  silver sulfADIAZINE (SILVADENE) 1 % cream Apply 1 application topically every other day as needed (for wound care).   Yes Historical Provider, MD  tiZANidine (ZANAFLEX) 4 MG tablet Take 4 mg by mouth every 8 (eight) hours as needed. Muscle spasms. 12/13/14  Yes Historical Provider, MD  zinc oxide (BALMEX) 11.3 % CREA cream Apply 1 application topically 3 (three) times daily as needed (for skin redness/irritation).   Yes Historical Provider, MD  acetaminophen (TYLENOL) 325 MG tablet Take 2 tablets (650 mg total) by mouth every 6 (six) hours as needed for fever. 09/06/14   Robbie Lis, MD  Amino Acids-Protein Hydrolys (FEEDING SUPPLEMENT, PRO-STAT SUGAR FREE 64,) LIQD Take 30 mLs by mouth 2 (two) times daily. Patient not taking: Reported on 02/25/2015 02/03/15   Louellen Molder, MD    Allergies  Allergen Reactions  . Erythromycin Nausea And Vomiting  . Nitrofurantoin Monohyd Macro Nausea And Vomiting  . Penicillins Hives    Has patient had a PCN reaction causing immediate rash, facial/tongue/throat swelling, SOB or lightheadedness with hypotension: Yes Has patient had a PCN reaction causing severe rash involving mucus membranes or skin necrosis: Yes Has patient had a PCN reaction that required hospitalization Yes- went to the DR Has patient had a PCN reaction occurring within the last 10 years: No-childhood allergy If all of the above answers are "NO", then may proceed with Cephalosporin use.     Physical Exam  Vitals  Blood pressure 74/52, pulse 80, temperature 99.1 F (37.3 C), temperature source Oral, resp. rate  16, SpO2 100 %.   1. General, frail thin-appearing female , comfortable lying in bed in NAD,    2. Normal affect and insight, Not Suicidal or Homicidal, Awake Alert, Oriented X 3.  3. No F.N deficits, ALL C.Nerves Intact, hands contracted, but a gross movement of upper extremity, paraplegic with flaccid lower extremities  4. Ears and Eyes appear Normal, Conjunctivae clear, PERRLA.  DryOral Mucosa.  5. Supple Neck, No JVD, No cervical lymphadenopathy appriciated, No Carotid Bruits. sit of healed tracheostomy  6. Symmetrical Chest wall movement, Good air movement bilaterally, CTAB.  7. RRR, No Gallops, Rubs or Murmurs, No Parasternal Heave.  8. Positive Bowel Sounds, Abdomen Soft, No tenderness, No organomegaly appriciated,No rebound -guarding or rigidity.  9.  No Cyanosis, Normal Skin Turgor, a shunt with sacral pressure ulcer stage IV, no discharge or secretion, does not appear infected.  10.  Significant muscle wasting and hypertrophy  , contracted hands, flaccid lower extremity  11. No Palpable Lymph  Nodes in Neck or Axillae    Data Review  CBC  Recent Labs Lab 02/25/15 1510  WBC 10.8*  HGB 11.5*  HCT 35.3*  PLT 473*  MCV 89.4  MCH 29.1  MCHC 32.6  RDW 15.8*  LYMPHSABS 1.6  MONOABS 0.8  EOSABS 0.2  BASOSABS 0.0   ------------------------------------------------------------------------------------------------------------------  Chemistries   Recent Labs Lab 02/25/15 1510  NA 135  K 5.1  CL 100*  CO2 25  GLUCOSE 77  BUN 12  CREATININE 0.51  CALCIUM 8.9  AST 18  ALT 7*  ALKPHOS 132*  BILITOT 1.5*   ------------------------------------------------------------------------------------------------------------------ CrCl cannot be calculated (Unknown ideal weight.). ------------------------------------------------------------------------------------------------------------------ No results for input(s): TSH, T4TOTAL, T3FREE, THYROIDAB in the last 72  hours.  Invalid input(s): FREET3   Coagulation profile No results for input(s): INR, PROTIME in the last 168 hours. ------------------------------------------------------------------------------------------------------------------- No results for input(s): DDIMER in the last 72 hours. -------------------------------------------------------------------------------------------------------------------  Cardiac Enzymes No results for input(s): CKMB, TROPONINI, MYOGLOBIN in the last 168 hours.  Invalid input(s): CK ------------------------------------------------------------------------------------------------------------------ Invalid input(s): POCBNP   ---------------------------------------------------------------------------------------------------------------  Urinalysis    Component Value Date/Time   COLORURINE YELLOW 02/25/2015 1529   APPEARANCEUR TURBID* 02/25/2015 1529   LABSPEC 1.009 02/25/2015 1529   PHURINE 6.0 02/25/2015 1529   GLUCOSEU NEGATIVE 02/25/2015 1529   HGBUR LARGE* 02/25/2015 1529   BILIRUBINUR NEGATIVE 02/25/2015 1529   KETONESUR NEGATIVE 02/25/2015 1529   PROTEINUR NEGATIVE 02/25/2015 1529   UROBILINOGEN 0.2 10/29/2014 1521   NITRITE NEGATIVE 02/25/2015 1529   LEUKOCYTESUR LARGE* 02/25/2015 1529    ----------------------------------------------------------------------------------------------------------------  Imaging results:   Dg Chest 2 View  02/25/2015  CLINICAL DATA:  Patient with wheezing and productive cough. EXAM: CHEST  2 VIEW COMPARISON:  Chest radiograph 01/31/2015. FINDINGS: Dual lead pacer apparatus overlies the left hemi thorax, leads are stable in position. Stable cardiac and mediastinal contours. Interval increase in right lower lung consolidation suggestive of right lower lobe atelectasis and associated consolidative opacities. No pleural effusion or pneumothorax. Thoracic spine degenerative changes. Lateral view nondiagnostic due to  overlapping soft tissue. IMPRESSION: Findings most compatible with right lower lobe atelectasis and associated consolidation concerning for superimposed infectious process. Electronically Signed   By: Lovey Newcomer M.D.   On: 02/25/2015 16:05    My personal review of EKG: Rhythm NSR, Rate  80 /min, QTc 442 , no Acute ST changes    Assessment & Plan  Active Problems:   Quadriplegia, C5-C7 incomplete (HCC)   HCAP (healthcare-associated pneumonia)   UTI (lower urinary tract infection)   Hypotension   Anemia   Cough, shortness of breath/HCAP - Patient admitted under Prempro new pathway, will continue with vancomycin and cefepime, there appears to be significant atelectasis contributing to this, so will start with good pulmonary toilet, incentive spirometry, flutter valve, chest PT, nebs as needed, will send sputum cultures, we'll follow blood cultures. - We'll check influenza panel  UTI - To change Foley catheter, will continue with cefepime, to follow on urine culture  Chronic anemia - At baseline, continue with iron supplement  Pressure ulcer - Does not appear to be infected, will consult wound care, will continue with Silvadene and zinc oxide  Quadriplegia -Stable, able to move upper extremity, but not her hands, continue with when necessary home medication including baclofen and Zanaflex.  Severe protein calorie malnutrition - Noted on supplements  Hypotension - Chronic, continue with midodrin  GERD - continue with PPI DVT Prophylaxis Lovenox    AM Labs Ordered, also please review Full  Orders  Family Communication: Admission, patients condition and plan of care including tests being ordered have been discussed with the patient  who indicate understanding and agree with the plan and Code Status.  Code Status Full  Likely DC to  : will admit to med/surg  Condition GUARDED   Time spent in minutes : 60 minutes    Bryley Chrisman M.D on 02/25/2015 at 6:07  PM  Between 7am to 7pm - Pager - [phone]  After 7pm go to [email or web address] - password [email or web address]  And look for the night coverage person covering me after hours  [facility] Office  [phone]

## 2015-02-25 NOTE — ED Provider Notes (Signed)
CSN: [id number]     Arrival date & time 02/25/15  1342 History   First MD Initiated Contact with Patient 02/25/15 1422     Chief Complaint  Patient presents with  . Fever  . Nasal Congestion     (Consider location/radiation/quality/duration/timing/severity/associated sxs/prior Treatment) Patient is a 45 y.o. female presenting with fever. The history is provided by the patient and medical records.  Fever Associated symptoms: cough     45 y.o. F with hx of asthma, pleurisy, GERD, quadriparesis, pacemaker insertion, presenting to the ED for cough, nasal congestion, and fever x 2 days.  Patient was admitted to the hospital in January for HCAP.  She states she was inaptient for about 5 days, went home of levaquin for another 5 days.  She states she did not seem to feel better so followed-up with her PCP who did another CXR and showed persistent HCAP, so was started on another 10 days of levaquin which she finished yesterday.  She states she still does not feel back to baseline.  She continues to have productive cough with light yellow sputum.  She began having some increased SOB 2 days ago, has been using her albuterol inhaler to help with this.  Denies chest pain.  No nausea, vomiting, diarrhea, or abdominal pain.    Past Medical History  Diagnosis Date  . Asthma   . Pleurisy   . GERD (gastroesophageal reflux disease)   . Quadriparesis ([place])   . Pacemaker    Past Surgical History  Procedure Laterality Date  . Appendectomy    . Cardiac surgery    . I&d extremity  10/28/2011    Procedure: IRRIGATION AND DEBRIDEMENT EXTREMITY;  Surgeon: Tennis Must, MD;  Location: [facility];  Service: Orthopedics;  Laterality: Right;  Irrigation and debridement right middle finger   Family History  Problem Relation Age of Onset  . Hypertension Maternal Uncle    Social History  Substance Use Topics  . Smoking status: Former Smoker -- 1.00 packs/day for 25 years    Types: Cigarettes  .  Smokeless tobacco: None  . Alcohol Use: Yes     Comment: 2 x weekly   OB History    No data available     Review of Systems  Constitutional: Positive for fever.  Respiratory: Positive for cough.   All other systems reviewed and are negative.     Allergies  Erythromycin; Nitrofurantoin monohyd macro; and Penicillins  Home Medications   Prior to Admission medications   Medication Sig Start Date End Date Taking? Authorizing Provider  albuterol (PROVENTIL HFA;VENTOLIN HFA) 108 (90 BASE) MCG/ACT inhaler Inhale 2 puffs into the lungs every 6 (six) hours as needed. For shortness of breath    Yes Historical Provider, MD  baclofen (LIORESAL) 10 MG tablet Take 1 tablet (10 mg total) by mouth 3 (three) times daily. Patient taking differently: Take 10-20 mg by mouth 3 (three) times daily. Pt takes two tablets (20 mg) in the morning and then one tablet mid-day and one tablet at night. 10/30/14  Yes Janece Canterbury, MD  [employer] VITAMIN B12 1000 MCG tablet Take 1 tablet by mouth daily. 08/22/14  Yes Historical Provider, MD  doxycycline (VIBRA-TABS) 100 MG tablet Take 100 mg by mouth every 12 (twelve) hours. continous 10/12/14  Yes Historical Provider, MD  DULoxetine (CYMBALTA) 30 MG capsule Take 30 mg by mouth 2 (two) times daily. 01/06/15  Yes Historical Provider, MD  ferrous sulfate 325 (65 FE) MG tablet Take  1 tablet (325 mg total) by mouth 3 (three) times daily. 08/04/14  Yes Janece Canterbury, MD  gabapentin (NEURONTIN) 400 MG capsule Take 400 mg by mouth 3 (three) times daily. 10/18/14  Yes Historical Provider, MD  ibuprofen (ADVIL,MOTRIN) 200 MG tablet Take 400 mg by mouth every 6 (six) hours as needed for moderate pain.   Yes Historical Provider, MD  midodrine (PROAMATINE) 5 MG tablet Take 5 mg by mouth 3 (three) times daily with meals.   Yes Historical Provider, MD  oxycodone (OXY-IR) 5 MG capsule Take 5 mg by mouth every 4 (four) hours as needed for pain.   Yes Historical Provider, MD  silver  sulfADIAZINE (SILVADENE) 1 % cream Apply 1 application topically every other day as needed (for wound care).   Yes Historical Provider, MD  tiZANidine (ZANAFLEX) 4 MG tablet Take 4 mg by mouth every 8 (eight) hours as needed. Muscle spasms. 12/13/14  Yes Historical Provider, MD  zinc oxide (BALMEX) 11.3 % CREA cream Apply 1 application topically 3 (three) times daily as needed (for skin redness/irritation).   Yes Historical Provider, MD  acetaminophen (TYLENOL) 325 MG tablet Take 2 tablets (650 mg total) by mouth every 6 (six) hours as needed for fever. 09/06/14   Robbie Lis, MD  Amino Acids-Protein Hydrolys (FEEDING SUPPLEMENT, PRO-STAT SUGAR FREE 64,) LIQD Take 30 mLs by mouth 2 (two) times daily. Patient not taking: Reported on 02/25/2015 02/03/15   Nishant Dhungel, MD   BP 84/51 mmHg  Pulse 85  Temp(Src) 99.1 F (37.3 C) (Oral)  Resp 14  SpO2 100%   Physical Exam  Constitutional: She is oriented to person, place, and time. She appears well-developed and well-nourished. No distress.  Thin, overall well appearing  HENT:  Head: Normocephalic and atraumatic.  Right Ear: Tympanic membrane and ear canal normal.  Left Ear: Tympanic membrane and ear canal normal.  Nose: Nose normal.  Mouth/Throat: Uvula is midline, oropharynx is clear and moist and mucous membranes are normal. No oropharyngeal exudate, posterior oropharyngeal edema, posterior oropharyngeal erythema or tonsillar abscesses.  Eyes: Conjunctivae and EOM are normal. Pupils are equal, round, and reactive to light.  Neck: Trachea normal, normal range of motion and phonation normal. Neck supple.  Prior trach scar of neck  Cardiovascular: Normal rate, regular rhythm and normal heart sounds.   Pulmonary/Chest: Effort normal. No respiratory distress. She has no wheezes. She has rhonchi. She has no rales.  Rhonchi of right upper and middle lobes; no wheezing, speaking in full sentences; no distress  Abdominal: Soft. Bowel sounds are  normal. There is no tenderness. There is no guarding.  Genitourinary:  Foley cath in place, yellow urine draining into bag  Musculoskeletal: Normal range of motion.  Neurological: She is alert and oriented to person, place, and time.  Awake, AAOx4; limited movement of extremities due to quadriplegia status, able to move left hand somewhat  Skin: Skin is warm and dry. She is not diaphoretic. There is pallor.  pale  Psychiatric: She has a normal mood and affect.  Nursing note and vitals reviewed.   ED Course  Procedures (including critical care time) Labs Review Labs Reviewed  CBC WITH DIFFERENTIAL/PLATELET - Abnormal; Notable for the following:    WBC 10.8 (*)    Hemoglobin 11.5 (*)    HCT 35.3 (*)    RDW 15.8 (*)    Platelets 473 (*)    Neutro Abs 8.2 (*)    All other components within normal limits  COMPREHENSIVE METABOLIC  PANEL - Abnormal; Notable for the following:    Chloride 100 (*)    Albumin 2.6 (*)    ALT 7 (*)    Alkaline Phosphatase 132 (*)    Total Bilirubin 1.5 (*)    All other components within normal limits  URINALYSIS, ROUTINE W REFLEX MICROSCOPIC (NOT AT [facility]) - Abnormal; Notable for the following:    APPearance TURBID (*)    Hgb urine dipstick LARGE (*)    Leukocytes, UA LARGE (*)    All other components within normal limits  URINE MICROSCOPIC-ADD ON - Abnormal; Notable for the following:    Squamous Epithelial / LPF 0-5 (*)    Bacteria, UA MANY (*)    Crystals CA OXALATE CRYSTALS (*)    All other components within normal limits  URINE CULTURE  CULTURE, BLOOD (ROUTINE X 2)  CULTURE, BLOOD (ROUTINE X 2)  CULTURE, EXPECTORATED SPUTUM-ASSESSMENT  GRAM STAIN  INFLUENZA PANEL BY PCR (TYPE A & B, H1N1)  STREP PNEUMONIAE URINARY ANTIGEN  I-STAT CG4 LACTIC ACID, ED  I-STAT TROPOININ, ED  I-STAT CG4 LACTIC ACID, ED    Imaging Review Dg Chest 2 View  02/25/2015  CLINICAL DATA:  Patient with wheezing and productive cough. EXAM: CHEST  2 VIEW COMPARISON:   Chest radiograph 01/31/2015. FINDINGS: Dual lead pacer apparatus overlies the left hemi thorax, leads are stable in position. Stable cardiac and mediastinal contours. Interval increase in right lower lung consolidation suggestive of right lower lobe atelectasis and associated consolidative opacities. No pleural effusion or pneumothorax. Thoracic spine degenerative changes. Lateral view nondiagnostic due to overlapping soft tissue. IMPRESSION: Findings most compatible with right lower lobe atelectasis and associated consolidation concerning for superimposed infectious process. Electronically Signed   By: Lovey Newcomer M.D.   On: 02/25/2015 16:05   I have personally reviewed and evaluated these images and lab results as part of my medical decision-making.   EKG Interpretation   Date/Time:  Sunday February 25 2015 15:30:20 EST Ventricular Rate:  80 PR Interval:  144 QRS Duration: 96 QT Interval:  383 QTC Calculation: 442 R Axis:   70 Text Interpretation:  Sinus rhythm Since last tracing rate slower  Confirmed by WENTZ  MD, ELLIOTT [id number]) on 02/25/2015 5:12:56 PM      MDM   Final diagnoses:  HCAP (healthcare-associated pneumonia)  Shortness of breath  Cough  Quadriplegia ([place])   45 y.o F here with fever, cough, SOB.  Recent HCAP in January.  Patient is afebrile, overall nontoxic in appearance. Patient has baseline hypotension treated with Midodrine, her normal BP is 90/60, today is 84/60.  She denies any chest pain currently.  She is in no acute respiratory distress. She does have some rhonchi in the right upper and middle lobes.  Denies any abdominal pain, nausea, vomiting.  Patient does have hx of recurrent sepsis secondary to pneumonia and UTI in the past-- will send sepsis orders.  Patient given IVF bolus.  Labwork is overall reassuring, normal WBC count and lactic acid.  Repeat CXR with either new or persistent right sided pneumonia.  Given that patient has completed to outpatient trials  of antibiotics, I feel she will need repeat admission for IV abx.  Started vanc and cefepime for coverage of HCAP.  U/a with many bacteria, patient has chronic indwelling foley catheter.  Blood and urine cultures pending.  BP remains baseline for patient given her hx of hypotension.  Case discussed with hospitalist, Dr. Waldron Labs, who will admit to medical floor. Requests to  change foley catheter and send influenza panel, both ordered.  Temp admit orders placed.  Larene Pickett, PA-C 02/25/15 2121  Daleen Bo, MD 02/25/15 [phone]

## 2015-02-25 NOTE — ED Notes (Signed)
Pt gave permission for ED staff to discuss her care with her mother, [name].

## 2015-02-25 NOTE — Progress Notes (Signed)
Pt instructed with Flutter valve usage.  Pt had good effort and technique.  Pt does require help holding flutter device during usage but knows to contact RN or  RT for help.

## 2015-02-25 NOTE — ED Notes (Signed)
Pt from home c/o fever and congestion x2 days. Pt has used her inhaler 3 times today for relief. Pt has hx of quadriplegia. Pt is A&O and in NAD

## 2015-02-26 DIAGNOSIS — L8944 Pressure ulcer of contiguous site of back, buttock and hip, stage 4: Secondary | ICD-10-CM | POA: Insufficient documentation

## 2015-02-26 LAB — INFLUENZA PANEL BY PCR (TYPE A & B)
H1N1 flu by pcr: NOT DETECTED
INFLAPCR: NEGATIVE
Influenza B By PCR: NEGATIVE

## 2015-02-26 LAB — BASIC METABOLIC PANEL
Anion gap: 10 (ref 5–15)
BUN: 11 mg/dL (ref 6–20)
CHLORIDE: 106 mmol/L (ref 101–111)
CO2: 23 mmol/L (ref 22–32)
Calcium: 8.9 mg/dL (ref 8.9–10.3)
Creatinine, Ser: 0.57 mg/dL (ref 0.44–1.00)
Glucose, Bld: 83 mg/dL (ref 65–99)
POTASSIUM: 3.9 mmol/L (ref 3.5–5.1)
SODIUM: 139 mmol/L (ref 135–145)

## 2015-02-26 LAB — CBC
HEMATOCRIT: 33.4 % — AB (ref 36.0–46.0)
Hemoglobin: 10.5 g/dL — ABNORMAL LOW (ref 12.0–15.0)
MCH: 28.7 pg (ref 26.0–34.0)
MCHC: 31.4 g/dL (ref 30.0–36.0)
MCV: 91.3 fL (ref 78.0–100.0)
PLATELETS: 438 10*3/uL — AB (ref 150–400)
RBC: 3.66 MIL/uL — AB (ref 3.87–5.11)
RDW: 15.9 % — ABNORMAL HIGH (ref 11.5–15.5)
WBC: 9 10*3/uL (ref 4.0–10.5)

## 2015-02-26 LAB — MRSA PCR SCREENING: MRSA by PCR: NEGATIVE

## 2015-02-26 LAB — EXPECTORATED SPUTUM ASSESSMENT W GRAM STAIN, RFLX TO RESP C

## 2015-02-26 LAB — STREP PNEUMONIAE URINARY ANTIGEN: STREP PNEUMO URINARY ANTIGEN: NEGATIVE

## 2015-02-26 MED ORDER — BACLOFEN 10 MG PO TABS
5.0000 mg | ORAL_TABLET | Freq: Three times a day (TID) | ORAL | Status: DC
  Administered 2015-02-26 – 2015-03-01 (×9): 5 mg via ORAL
  Filled 2015-02-26 (×9): qty 1

## 2015-02-26 MED ORDER — SODIUM CHLORIDE 0.9 % IV BOLUS (SEPSIS)
250.0000 mL | Freq: Once | INTRAVENOUS | Status: AC
  Administered 2015-02-26: 250 mL via INTRAVENOUS

## 2015-02-26 MED ORDER — ALBUTEROL SULFATE (2.5 MG/3ML) 0.083% IN NEBU
2.5000 mg | INHALATION_SOLUTION | Freq: Four times a day (QID) | RESPIRATORY_TRACT | Status: DC
  Administered 2015-02-26 – 2015-02-27 (×6): 2.5 mg via RESPIRATORY_TRACT
  Filled 2015-02-26 (×6): qty 3

## 2015-02-26 MED ORDER — ENSURE ENLIVE PO LIQD
237.0000 mL | Freq: Three times a day (TID) | ORAL | Status: DC
  Administered 2015-02-26 – 2015-02-28 (×5): 237 mL via ORAL

## 2015-02-26 MED ORDER — MIDODRINE HCL 5 MG PO TABS
10.0000 mg | ORAL_TABLET | Freq: Three times a day (TID) | ORAL | Status: DC
  Administered 2015-02-26: 10 mg via ORAL
  Filled 2015-02-26 (×5): qty 2

## 2015-02-26 MED ORDER — INFLUENZA VAC SPLIT QUAD 0.5 ML IM SUSY
0.5000 mL | PREFILLED_SYRINGE | INTRAMUSCULAR | Status: AC
  Administered 2015-02-27: 0.5 mL via INTRAMUSCULAR
  Filled 2015-02-26 (×2): qty 0.5

## 2015-02-26 NOTE — Progress Notes (Signed)
Infection control Rn notifed of flu swab results of negative. Droplet precautions discontinued.

## 2015-02-26 NOTE — Progress Notes (Signed)
Dr. Marvel Plan notifed of B/P 74/48,mAP 56. Continue normal saline at 75cc/hr . Notify Md if patient is symtomatic.

## 2015-02-26 NOTE — Progress Notes (Signed)
Initial Nutrition Assessment  DOCUMENTATION CODES:   Severe malnutrition in context of chronic illness  INTERVENTION:   -Continue Ensure Enlive po TID, each supplement provides 350 kcals and 20 grams of protein. -Provide Magic Cups with each meal, each supplement provides 290 kcals and 9 grams of protein. -Encourage good po intake of protein food and supplement sources.  NUTRITION DIAGNOSIS:   Malnutrition related to chronic illness as evidenced by percent weight loss, severe depletion of muscle mass.  GOAL:   Patient will meet greater than or equal to 90% of their needs  MONITOR:   PO intake, Supplement acceptance, Labs, Weight trends, Skin  REASON FOR ASSESSMENT:   Low Braden   ASSESSMENT:   45 y.o. female,with history of quadriparesis, pacemaker placement, asthma, autonomic dysfunction with hypotension, and stage 4 sacral decubitus ulcer, pressureon chronic doxycycline, and with recent hospitalization for healthcare acquired pneumonia\  Patient reports a poor appetite for the past few days.  For breakfast pt consumed grits.  For lunch pt consumed about a quarter of her meal of pot roast, pinto beans, and macaroni and cheese (meal completion of 25% documented in chart).  Patient had completed about 1/2 of an Ensure.  RD encouraged patient to prioritize consuming the high protein foods/supplements for wound healing. Patient was offered and declined a multivitamin for fear of medication interactions.  Patient requested Magic Cups with her meals. RD to order.   Pt reports that she has maintained a stable weight, with 90 - 95 lbs being her usual body weight.  Pt weighs 93 lbs today.  Nutrition Focused Physical Exam was completed. Findings were no fat depletion, severe muscle depletion, and no edema.  Only upper body areas were assessed due to wounds on lower extremities.   Labs reviewed.  Medications reviewed: ferrous sulfate and vitamin B12.  Diet Order:  Diet regular Room  service appropriate?: Yes; Fluid consistency:: Thin  Skin:  Wound (see comment) (Stage IV Sacral Ulcer, Stage III L ischial tuberosity, foot wound)  Last BM:  2/5  Height:   Ht Readings from Last 1 Encounters:  02/25/15 5\' 2"  (1.575 m)   Weight:   Wt Readings from Last 1 Encounters:  02/25/15 93 lb 11.1 oz (42.5 kg)   Ideal Body Weight:  45 kg  BMI:  Body mass index is 17.13 kg/(m^2).  Estimated Nutritional Needs:   Kcal:  1400-1600  Protein:  75-85 grams  Fluid:  >/= 1.5 L  EDUCATION NEEDS:   No education needs identified at this time  Veronda Prude, Dietetic Intern Pager: [phone]

## 2015-02-26 NOTE — Consult Note (Signed)
WOC wound consult note Reason for Consult: Stage 4 Pressure injury to sacrum, Stage 3 pressure injury to left ischial tuberosity, full thickness ulcer to right lateral foot at 5th digit.  Patient is under that care of Dr. Vernona Rieger (Plastics) at [facility] in [place], [place] and has support of [facility] once weekly at home.  Husband and mother are primary care providers.  Wound has been progressing over the past year and patient is hopeful that a reparative skin graft can be done this year at [facility]. Patient and husband were not aware of the severity of the right foot ulcer; patient is on an therapeutic sleep surface at home with air fluidized feature at home, is mostly compliant with staying off of her back and not in the seated position while at home, but is reminded today that the IT ulcers are from sitting.  Husband states that she does like to watch television on her back in bed. She states that she has two pair of pressure redistribution heel boots from previous admissions and does not want another pair.  She is taught that we will float heels.   Wound type:Pressure Pressure Ulcer POA: Yes Measurement: See below. Wound [id number]:  7cm x 12cm x 0.2cm in 75% of the wound and 1.5cm at 9-11 o'clock. Base is pale pink to red with areas of pocketing and undermining at 3-5 o'clock and 9-11 o'clock with depth measuring 1-1.5cm. There is a small <1cm round island of epithelialization in the center of the wound.  Ischial Tuberosity:  5cm x 3cm x 0.4cm with dull, red wound bed.  Right foot, lateral aspect at 5th digit is a 1.5cm x 1cm x 0.2cm full thickness wound with sloughing black tissue with red wound bed beneath it.  No drainage, no odor. Drainage (amount, consistency, odor) Moderate amount serous to light yellow exudate. Periwound:Intact with evidence of previous wound healing Dressing procedure/placement/frequency: Patient last saw Dr. Vernona Rieger less than 10 days ago and he wished at that time to continue silver  dressings despite the patient having used them for greater than two months.  I will continue with that POC here and have provided orders for daily and PRN soiling dressing changes. I have also given Nursing guidance for turning and repositioning and for the floatation of heels. [place] nursing team will not follow, but will remain available to this patient, the nursing and medical teams.  Please re-consult if needed. Thanks, Maudie Flakes, MSN, RN, [place], Arther Abbott  Pager# [phone]

## 2015-02-26 NOTE — Care Management Note (Signed)
Case Management Note  Patient Details  Name: Angela Page MRN: [id number] Date of Birth: 05-Jun-1970  Subjective/Objective:         pna           Action/Plan:Patient using the services of [facility]   Expected Discharge Date:   (unknown)               Expected Discharge Plan:  [place]  In-House Referral:  NA  Discharge planning Services  CM Consult  Post Acute Care Choice:  Home Health Choice offered to:  NA  DME Arranged:    DME Agency:     HH Arranged:  RN, Disease Management [place] Agency:  [place]  Status of Service:  Completed, signed off  Medicare Important Message Given:    Date Medicare IM Given:    Medicare IM give by:    Date Additional Medicare IM Given:    Additional Medicare Important Message give by:     If discussed at [place] of Stay Meetings, dates discussed:    Additional Comments:  Leeroy Cha, RN 02/26/2015, 10:58 AM

## 2015-02-26 NOTE — Progress Notes (Signed)
Patient Demographics  Angela Page, is a 45 y.o. female, DOB - [birth year], [id number]  Admit date - 02/25/2015   Admitting Physician Albertine Patricia, MD  Outpatient Primary MD for the patient is No PCP Per Patient  LOS - 1   Chief Complaint  Patient presents with  . Fever  . Nasal Congestion       Admission HPI/Brief narrative: 45 y.o. female,with history of quadriparesis, pacemaker placement, asthma, autonomic dysfunction with hypotension, and stage 4 sacral decubitus ulcer, pressure on chronic doxycycline, and with recent hospitalization for healthcare acquired pneumonia, treated with IV antibiotics, treated with by mouth levofloxacin as an outpatient by PCP as well, though complaining of cough, productive with congestion, come back to ED, chest x-ray with persistent pneumonia, as well as a workup significant for UTI. Subjective:   Angela Page today has, No headache, No chest pain, No abdominal pain - No Nausea, still complaing of productive cough.   Assessment & Plan    Active Problems:   Quadriplegia, C5-C7 incomplete ([place])   HCAP (healthcare-associated pneumonia)   UTI (lower urinary tract infection)   Hypotension   Anemia   Pressure ulcer  Cough, shortness of breath/HCAP - will continue with vancomycin and cefepime, follow on sputum cultures and blood cultures. - Continue with pulmonary toilet, chest PT, flutter valve, nebs, Mucinex, as patient has poor ability to clear secretion giving her quadriplegia. - Follow on influenza panel  UTI - We will change Foley catheter, will continue with cefepime, follow on urine culture  Hypotension - Chronic, symptomatic, due to autonomic dysfunction, significantly hypotensive systolic blood pressure in the 60s and 70s, she still remains symptomatic, increased her Midodrin, will increase her IV fluids.  Chronic anemia - At baseline,  continue with iron supplement  Pressure ulcer - Wound care consulted, continue with wound care  Quadriplegia -Stable, able to move upper extremity, but not her hands, continue with when necessary home medication including baclofen and Zanaflex.  Severe protein calorie malnutrition - Started on supplement.  Hypotension - Chronic, continue with midodrin  GERD - continue with PPI  Code Status: Full  Family Communication: D/W patient  Disposition Plan: home when stable   Procedures  none   Consults   none   Medications  Scheduled Meds: . albuterol  2.5 mg Nebulization QID  . baclofen  5 mg Oral TID  . ceFEPime (MAXIPIME) IV  1 g Intravenous Q8H  . docusate sodium  100 mg Oral BID  . DULoxetine  30 mg Oral BID  . enoxaparin (LOVENOX) injection  30 mg Subcutaneous Q24H  . feeding supplement (ENSURE ENLIVE)  237 mL Oral TID BM  . ferrous sulfate  325 mg Oral TID WC  . gabapentin  400 mg Oral TID  . guaiFENesin  1,200 mg Oral BID  . [START ON 02/27/2015] Influenza vac split quadrivalent PF  0.5 mL Intramuscular Tomorrow-1000  . midodrine  10 mg Oral TID WC  . vancomycin  500 mg Intravenous Q12H  . cyanocobalamin  1,000 mcg Oral Daily   Continuous Infusions: . sodium chloride 50 mL (02/25/15 2051)   PRN Meds:.acetaminophen, albuterol, bisacodyl, ondansetron **OR** ondansetron (ZOFRAN) IV, oxyCODONE, senna-docusate, silver sulfADIAZINE, tiZANidine, zinc oxide  DVT Prophylaxis  Lovenox  Lab Results  Component Value Date   PLT 438* 02/26/2015    Antibiotics    Anti-infectives    Start     Dose/Rate Route Frequency Ordered Stop   02/26/15 0800  vancomycin (VANCOCIN) 500 mg in sodium chloride 0.9 % 100 mL IVPB     500 mg 100 mL/hr over 60 Minutes Intravenous Every 12 hours 02/25/15 1757     02/25/15 1830  vancomycin (VANCOCIN) IVPB 1000 mg/200 mL premix     1,000 mg 200 mL/hr over 60 Minutes Intravenous  Once 02/25/15 1757 02/25/15 2227   02/25/15 1800   ceFEPIme (MAXIPIME) 1 g in dextrose 5 % 50 mL IVPB     1 g 100 mL/hr over 30 Minutes Intravenous Every 8 hours 02/25/15 1756            Objective:   Filed Vitals:   02/26/15 0800 02/26/15 0830 02/26/15 0902 02/26/15 0945  BP: 66/28 78/59 78/59  112/65  Pulse: 78 89 80 80  Temp: 98.1 F (36.7 C)     TempSrc: Oral     Resp: 13 17 16 18   Height:      Weight:      SpO2: 88% 98% 94% 98%    Wt Readings from Last 3 Encounters:  02/25/15 42.5 kg (93 lb 11.1 oz)  01/31/15 42.185 kg (93 lb)  10/29/14 42.5 kg (93 lb 11.1 oz)     Intake/Output Summary (Last 24 hours) at 02/26/15 1236 Last data filed at 02/26/15 0936  Gross per 24 hour  Intake   1850 ml  Output   1050 ml  Net    800 ml     Physical Exam  Awake Alert, Oriented X 3,  [place].AT,PERRAL Supple Neck,No JVD, Symmetrical Chest wall movement, Fair  air movement bilaterally,no wheezing RRR,No Gallops,Rubs or new Murmurs, No Parasternal Heave +ve B.Sounds, Abd Soft, No tenderness, No rebound - guarding or rigidity. No Cyanosis, Clubbing or edema, contracted hands, flaccid lower extremities.    Data Review   Micro Results Recent Results (from the past 240 hour(s))  MRSA PCR Screening     Status: None   Collection Time: 02/25/15  9:26 PM  Result Value Ref Range Status   MRSA by PCR NEGATIVE NEGATIVE Final    Comment:        The GeneXpert MRSA Assay (FDA approved for NASAL specimens only), is one component of a comprehensive MRSA colonization surveillance program. It is not intended to diagnose MRSA infection nor to guide or monitor treatment for MRSA infections.     Radiology Reports Dg Chest 2 View  02/25/2015  CLINICAL DATA:  Patient with wheezing and productive cough. EXAM: CHEST  2 VIEW COMPARISON:  Chest radiograph 01/31/2015. FINDINGS: Dual lead pacer apparatus overlies the left hemi thorax, leads are stable in position. Stable cardiac and mediastinal contours. Interval increase in right lower lung  consolidation suggestive of right lower lobe atelectasis and associated consolidative opacities. No pleural effusion or pneumothorax. Thoracic spine degenerative changes. Lateral view nondiagnostic due to overlapping soft tissue. IMPRESSION: Findings most compatible with right lower lobe atelectasis and associated consolidation concerning for superimposed infectious process. Electronically Signed   By: Lovey Newcomer M.D.   On: 02/25/2015 16:05   Dg Chest Port 1 View  01/31/2015  CLINICAL DATA:  Fever, chills, and cough for 3 days.  Asthma. EXAM: PORTABLE CHEST 1 VIEW COMPARISON:  10/29/2014 FINDINGS: Dual lead transvenous pacemaker remains in appropriate position. Heart size is normal. New asymmetric airspace opacity  is seen in right lower lung, suspicious for pneumonia. Left lung is clear. No evidence of pneumothorax or pleural effusion. IMPRESSION: New right lower lung airspace disease, suspicious for pneumonia. Followup PA and lateral chest X-ray is recommended in 3-4 weeks following trial of antibiotic therapy to ensure resolution. Electronically Signed   By: Earle Gell M.D.   On: 01/31/2015 17:19     CBC  Recent Labs Lab 02/25/15 1510 02/26/15 0402  WBC 10.8* 9.0  HGB 11.5* 10.5*  HCT 35.3* 33.4*  PLT 473* 438*  MCV 89.4 91.3  MCH 29.1 28.7  MCHC 32.6 31.4  RDW 15.8* 15.9*  LYMPHSABS 1.6  --   MONOABS 0.8  --   EOSABS 0.2  --   BASOSABS 0.0  --     Chemistries   Recent Labs Lab 02/25/15 1510 02/26/15 0402  NA 135 139  K 5.1 3.9  CL 100* 106  CO2 25 23  GLUCOSE 77 83  BUN 12 11  CREATININE 0.51 0.57  CALCIUM 8.9 8.9  AST 18  --   ALT 7*  --   ALKPHOS 132*  --   BILITOT 1.5*  --    ------------------------------------------------------------------------------------------------------------------ estimated creatinine clearance is 60.2 mL/min (by C-G formula based on Cr of  0.57). ------------------------------------------------------------------------------------------------------------------ No results for input(s): HGBA1C in the last 72 hours. ------------------------------------------------------------------------------------------------------------------ No results for input(s): CHOL, HDL, LDLCALC, TRIG, CHOLHDL, LDLDIRECT in the last 72 hours. ------------------------------------------------------------------------------------------------------------------ No results for input(s): TSH, T4TOTAL, T3FREE, THYROIDAB in the last 72 hours.  Invalid input(s): FREET3 ------------------------------------------------------------------------------------------------------------------ No results for input(s): VITAMINB12, FOLATE, FERRITIN, TIBC, IRON, RETICCTPCT in the last 72 hours.  Coagulation profile No results for input(s): INR, PROTIME in the last 168 hours.  No results for input(s): DDIMER in the last 72 hours.  Cardiac Enzymes No results for input(s): CKMB, TROPONINI, MYOGLOBIN in the last 168 hours.  Invalid input(s): CK ------------------------------------------------------------------------------------------------------------------ Invalid input(s): POCBNP     Time Spent in minutes   30 minutes   Opaline Reyburn M.D on 02/26/2015 at 12:36 PM  Between 7am to 7pm - Pager - [phone]  After 7pm go to [email or web address] - password [facility]  [facility]   Office  [phone]

## 2015-02-27 LAB — BASIC METABOLIC PANEL
ANION GAP: 9 (ref 5–15)
BUN: 10 mg/dL (ref 6–20)
CHLORIDE: 108 mmol/L (ref 101–111)
CO2: 24 mmol/L (ref 22–32)
Calcium: 8.8 mg/dL — ABNORMAL LOW (ref 8.9–10.3)
Creatinine, Ser: 0.35 mg/dL — ABNORMAL LOW (ref 0.44–1.00)
GFR calc Af Amer: >60 mL/min (ref >60)
Glucose, Bld: 94 mg/dL (ref 65–99)
POTASSIUM: 3.9 mmol/L (ref 3.5–5.1)
SODIUM: 141 mmol/L (ref 135–145)

## 2015-02-27 LAB — URINE MICROSCOPIC-ADD ON

## 2015-02-27 LAB — URINALYSIS, ROUTINE W REFLEX MICROSCOPIC
BILIRUBIN URINE: NEGATIVE
GLUCOSE, UA: NEGATIVE mg/dL
KETONES UR: NEGATIVE mg/dL
Nitrite: NEGATIVE
PH: 5.5 (ref 5.0–8.0)
PROTEIN: NEGATIVE mg/dL
Specific Gravity, Urine: 1.014 (ref 1.005–1.030)

## 2015-02-27 LAB — URINE CULTURE

## 2015-02-27 LAB — MAGNESIUM: MAGNESIUM: 1.9 mg/dL (ref 1.7–2.4)

## 2015-02-27 LAB — PHOSPHORUS: PHOSPHORUS: 3.6 mg/dL (ref 2.5–4.6)

## 2015-02-27 MED ORDER — ALBUTEROL SULFATE (2.5 MG/3ML) 0.083% IN NEBU
2.5000 mg | INHALATION_SOLUTION | Freq: Three times a day (TID) | RESPIRATORY_TRACT | Status: DC
  Administered 2015-02-28 – 2015-03-01 (×3): 2.5 mg via RESPIRATORY_TRACT
  Filled 2015-02-27 (×3): qty 3

## 2015-02-27 MED ORDER — MIDODRINE HCL 5 MG PO TABS
5.0000 mg | ORAL_TABLET | Freq: Three times a day (TID) | ORAL | Status: DC
Start: 2015-02-27 — End: 2015-03-01
  Administered 2015-02-27 – 2015-03-01 (×8): 5 mg via ORAL
  Filled 2015-02-27 (×10): qty 1

## 2015-02-27 NOTE — Progress Notes (Signed)
Foley leaking significantly although still draining well and producing an adequate amount of clear, yellow urine. Pt states that at home she wears an 18Fr foley. Dr. Waldron Labs made aware and states to leave foley in at this time. He is to follow up with urology.

## 2015-02-27 NOTE — Progress Notes (Signed)
Patient Demographics  Angela Page, is a 45 y.o. female, DOB - January 08, 1971, [id number]  Admit date - 02/25/2015   Admitting Physician Albertine Patricia, MD  Outpatient Primary MD for the patient is No PCP Per Patient  LOS - 2   Chief Complaint  Patient presents with  . Fever  . Nasal Congestion       Admission HPI/Brief narrative: 45 y.o. female,with history of quadriparesis, pacemaker placement, asthma, autonomic dysfunction with hypotension, and stage 4 sacral decubitus ulcer, pressure on chronic doxycycline, and with recent hospitalization for healthcare acquired pneumonia, treated with IV antibiotics, treated with by mouth levofloxacin as an outpatient by PCP as well, though complaining of cough, productive with congestion, come back to ED, chest x-ray with persistent pneumonia, as well as a workup significant for UTI. Subjective:   Angela Page today has, No headache, No chest pain, No abdominal pain - No Nausea, still complaing of productive cough.   Assessment & Plan    Active Problems:   Quadriplegia, C5-C7 incomplete ([place])   HCAP (healthcare-associated pneumonia)   UTI (lower urinary tract infection)   Hypotension   Anemia   Pressure ulcer  Cough, shortness of breath/HCAP - Treated with vancomycin and cefepime, blood culture was no growth to date, sputum culture not representative of lower respiratory secretions, will discontinue IV vancomycin, will continue cefepime. - Continue with pulmonary toilet, chest PT, flutter valve, nebs, Mucinex, as patient has poor ability to clear secretion giving her quadriplegia. - Negative influenza panel  UTI - Changed Foley catheter, continue with cefepime, urine culture growing multiple species, will send another urine culture. - Patient with urinary incontinence after changing Foley catheter, discussed with Dr. Gaynelle Arabian, patient likely  will need suprapubic Foley catheter were more stable, for which she has to follow with her primary urologist in [facility] after discharge.  Hypotension - Chronic, asymptomatic, due to autonomic dysfunction, significantly hypotensive systolic blood pressure in the 60s and 70s yesterday, difficulty improved with fluid bolus and increasing midodrine dose, actually blood pressure on the higher side today, so will go back on baseline dose  5 mg 3 times daily.  Chronic anemia - At baseline, continue with iron supplement  Pressure ulcer - Wound care consulted, continue with wound care  Quadriplegia -Stable, able to move upper extremity, but not her hands, continue with when necessary home medication including baclofen and Zanaflex.  Severe protein calorie malnutrition - Started on supplement.  GERD - continue with PPI  Code Status: Full  Family Communication: D/W patient  Disposition Plan: home when stable   Procedures  none   Consults   none   Medications  Scheduled Meds: . albuterol  2.5 mg Nebulization QID  . baclofen  5 mg Oral TID  . ceFEPime (MAXIPIME) IV  1 g Intravenous Q8H  . docusate sodium  100 mg Oral BID  . DULoxetine  30 mg Oral BID  . enoxaparin (LOVENOX) injection  30 mg Subcutaneous Q24H  . feeding supplement (ENSURE ENLIVE)  237 mL Oral TID BM  . ferrous sulfate  325 mg Oral TID WC  . gabapentin  400 mg Oral TID  . guaiFENesin  1,200 mg Oral BID  . midodrine  5 mg Oral TID WC  .  cyanocobalamin  1,000 mcg Oral Daily   Continuous Infusions: . sodium chloride 75 mL/hr at 02/26/15 1656   PRN Meds:.acetaminophen, albuterol, bisacodyl, ondansetron **OR** ondansetron (ZOFRAN) IV, oxyCODONE, senna-docusate, silver sulfADIAZINE, tiZANidine, zinc oxide  DVT Prophylaxis  Lovenox   Lab Results  Component Value Date   PLT 438* 02/26/2015    Antibiotics    Anti-infectives    Start     Dose/Rate Route Frequency Ordered Stop   02/26/15 0800  vancomycin  (VANCOCIN) 500 mg in sodium chloride 0.9 % 100 mL IVPB  Status:  Discontinued     500 mg 100 mL/hr over 60 Minutes Intravenous Every 12 hours 02/25/15 1757 02/27/15 1329   02/25/15 1830  vancomycin (VANCOCIN) IVPB 1000 mg/200 mL premix     1,000 mg 200 mL/hr over 60 Minutes Intravenous  Once 02/25/15 1757 02/25/15 2227   02/25/15 1800  ceFEPIme (MAXIPIME) 1 g in dextrose 5 % 50 mL IVPB     1 g 100 mL/hr over 30 Minutes Intravenous Every 8 hours 02/25/15 1756            Objective:   Filed Vitals:   02/27/15 0830 02/27/15 1000 02/27/15 1200 02/27/15 1201  BP: 101/86 114/92 111/81   Pulse: 80     Temp: 97.7 F (36.5 C)   97.7 F (36.5 C)  TempSrc: Oral   Oral  Resp: 12 14 13    Height:      Weight:      SpO2: 96% 96% 97%     Wt Readings from Last 3 Encounters:  02/25/15 42.5 kg (93 lb 11.1 oz)  01/31/15 42.185 kg (93 lb)  10/29/14 42.5 kg (93 lb 11.1 oz)     Intake/Output Summary (Last 24 hours) at 02/27/15 1332 Last data filed at 02/27/15 1100  Gross per 24 hour  Intake   2092 ml  Output   2025 ml  Net     67 ml     Physical Exam  Awake Alert, Oriented X 3,  Angela Page,PERRAL Supple Neck,No JVD, Symmetrical Chest wall movement, Fair  air movement bilaterally,no wheezing RRR,No Gallops,Rubs or new Murmurs, No Parasternal Heave +ve B.Sounds, Abd Soft, No tenderness, No rebound - guarding or rigidity. No Cyanosis, Clubbing or edema, contracted hands, flaccid lower extremities.    Data Review   Micro Results Recent Results (from the past 240 hour(s))  Blood culture (routine x 2)     Status: None (Preliminary result)   Collection Time: 02/25/15  3:10 PM  Result Value Ref Range Status   Specimen Description BLOOD RIGHT ARM  Final   Special Requests BOTTLES DRAWN AEROBIC AND ANAEROBIC 5CC  Final   Culture   Final    NO GROWTH < 24 HOURS Performed at [facility]    Report Status PENDING  Incomplete  Urine culture     Status: None   Collection Time:  02/25/15  3:29 PM  Result Value Ref Range Status   Specimen Description URINE, CATHETERIZED  Final   Special Requests NONE  Final   Culture   Final    MULTIPLE SPECIES PRESENT, SUGGEST RECOLLECTION Performed at [facility]    Report Status 02/27/2015 FINAL  Final  Blood culture (routine x 2)     Status: None (Preliminary result)   Collection Time: 02/25/15  4:04 PM  Result Value Ref Range Status   Specimen Description BLOOD LEFT HAND  Final   Special Requests IN PEDIATRIC BOTTLE [facility]  Final   Culture  Final    NO GROWTH < 24 HOURS Performed at [facility]    Report Status PENDING  Incomplete  MRSA PCR Screening     Status: None   Collection Time: 02/25/15  9:26 PM  Result Value Ref Range Status   MRSA by PCR NEGATIVE NEGATIVE Final    Comment:        The GeneXpert MRSA Assay (FDA approved for NASAL specimens only), is one component of a comprehensive MRSA colonization surveillance program. It is not intended to diagnose MRSA infection nor to guide or monitor treatment for MRSA infections.   Culture, sputum-assessment     Status: None   Collection Time: 02/26/15  1:02 PM  Result Value Ref Range Status   Specimen Description SPUTUM  Final   Special Requests NONE  Final   Sputum evaluation   Final    MICROSCOPIC FINDINGS SUGGEST THAT THIS SPECIMEN IS NOT REPRESENTATIVE OF LOWER RESPIRATORY SECRETIONS. PLEASE RECOLLECT. INFORMED SHEPHERD,G. @1340  ON 2.6.17 BY MCCOY,N.    Report Status 02/26/2015 FINAL  Final    Radiology Reports Dg Chest 2 View  02/25/2015  CLINICAL DATA:  Patient with wheezing and productive cough. EXAM: CHEST  2 VIEW COMPARISON:  Chest radiograph 01/31/2015. FINDINGS: Dual lead pacer apparatus overlies the left hemi thorax, leads are stable in position. Stable cardiac and mediastinal contours. Interval increase in right lower lung consolidation suggestive of right lower lobe atelectasis and associated consolidative opacities. No pleural  effusion or pneumothorax. Thoracic spine degenerative changes. Lateral view nondiagnostic due to overlapping soft tissue. IMPRESSION: Findings most compatible with right lower lobe atelectasis and associated consolidation concerning for superimposed infectious process. Electronically Signed   By: Lovey Newcomer M.D.   On: 02/25/2015 16:05   Dg Chest Port 1 View  01/31/2015  CLINICAL DATA:  Fever, chills, and cough for 3 days.  Asthma. EXAM: PORTABLE CHEST 1 VIEW COMPARISON:  10/29/2014 FINDINGS: Dual lead transvenous pacemaker remains in appropriate position. Heart size is normal. New asymmetric airspace opacity is seen in right lower lung, suspicious for pneumonia. Left lung is clear. No evidence of pneumothorax or pleural effusion. IMPRESSION: New right lower lung airspace disease, suspicious for pneumonia. Followup PA and lateral chest X-ray is recommended in 3-4 weeks following trial of antibiotic therapy to ensure resolution. Electronically Signed   By: Earle Gell M.D.   On: 01/31/2015 17:19     CBC  Recent Labs Lab 02/25/15 1510 02/26/15 0402  WBC 10.8* 9.0  HGB 11.5* 10.5*  HCT 35.3* 33.4*  PLT 473* 438*  MCV 89.4 91.3  MCH 29.1 28.7  MCHC 32.6 31.4  RDW 15.8* 15.9*  LYMPHSABS 1.6  --   MONOABS 0.8  --   EOSABS 0.2  --   BASOSABS 0.0  --     Chemistries   Recent Labs Lab 02/25/15 1510 02/26/15 0402 02/27/15 0403  NA 135 139 141  K 5.1 3.9 3.9  CL 100* 106 108  CO2 25 23 24   GLUCOSE 77 83 94  BUN 12 11 10   CREATININE 0.51 0.57 0.35*  CALCIUM 8.9 8.9 8.8*  MG  --   --  1.9  AST 18  --   --   ALT 7*  --   --   ALKPHOS 132*  --   --   BILITOT 1.5*  --   --    ------------------------------------------------------------------------------------------------------------------ estimated creatinine clearance is 60.2 mL/min (by C-G formula based on Cr of  0.35). ------------------------------------------------------------------------------------------------------------------ No results for input(s):  HGBA1C in the last 72 hours. ------------------------------------------------------------------------------------------------------------------ No results for input(s): CHOL, HDL, LDLCALC, TRIG, CHOLHDL, LDLDIRECT in the last 72 hours. ------------------------------------------------------------------------------------------------------------------ No results for input(s): TSH, T4TOTAL, T3FREE, THYROIDAB in the last 72 hours.  Invalid input(s): FREET3 ------------------------------------------------------------------------------------------------------------------ No results for input(s): VITAMINB12, FOLATE, FERRITIN, TIBC, IRON, RETICCTPCT in the last 72 hours.  Coagulation profile No results for input(s): INR, PROTIME in the last 168 hours.  No results for input(s): DDIMER in the last 72 hours.  Cardiac Enzymes No results for input(s): CKMB, TROPONINI, MYOGLOBIN in the last 168 hours.  Invalid input(s): CK ------------------------------------------------------------------------------------------------------------------ Invalid input(s): POCBNP     Time Spent in minutes   30 minutes   Kymani Shimabukuro M.D on 02/27/2015 at 1:32 PM  Between 7am to 7pm - Pager - [phone]  After 7pm go to [email or web address] - password [facility]  [facility]   Office  [phone]

## 2015-02-28 ENCOUNTER — Inpatient Hospital Stay (HOSPITAL_COMMUNITY): Payer: Medicaid Other

## 2015-02-28 DIAGNOSIS — G8254 Quadriplegia, C5-C7 incomplete: Secondary | ICD-10-CM

## 2015-02-28 DIAGNOSIS — N390 Urinary tract infection, site not specified: Secondary | ICD-10-CM

## 2015-02-28 DIAGNOSIS — I9589 Other hypotension: Secondary | ICD-10-CM

## 2015-02-28 DIAGNOSIS — J189 Pneumonia, unspecified organism: Principal | ICD-10-CM

## 2015-02-28 LAB — BASIC METABOLIC PANEL
Anion gap: 10 (ref 5–15)
BUN: 7 mg/dL (ref 6–20)
CALCIUM: 8.6 mg/dL — AB (ref 8.9–10.3)
CO2: 21 mmol/L — AB (ref 22–32)
CREATININE: 0.44 mg/dL (ref 0.44–1.00)
Chloride: 104 mmol/L (ref 101–111)
GFR calc Af Amer: >60 mL/min (ref >60)
GLUCOSE: 91 mg/dL (ref 65–99)
Potassium: 3.6 mmol/L (ref 3.5–5.1)
Sodium: 135 mmol/L (ref 135–145)

## 2015-02-28 LAB — CBC
HEMATOCRIT: 34.2 % — AB (ref 36.0–46.0)
Hemoglobin: 10.7 g/dL — ABNORMAL LOW (ref 12.0–15.0)
MCH: 28.2 pg (ref 26.0–34.0)
MCHC: 31.3 g/dL (ref 30.0–36.0)
MCV: 90 fL (ref 78.0–100.0)
PLATELETS: 374 10*3/uL (ref 150–400)
RBC: 3.8 MIL/uL — ABNORMAL LOW (ref 3.87–5.11)
RDW: 15.5 % (ref 11.5–15.5)
WBC: 5.9 10*3/uL (ref 4.0–10.5)

## 2015-02-28 MED ORDER — DIPHENHYDRAMINE HCL 50 MG PO CAPS
50.0000 mg | ORAL_CAPSULE | Freq: Every day | ORAL | Status: DC
  Administered 2015-02-28: 50 mg via ORAL
  Filled 2015-02-28: qty 1

## 2015-02-28 MED ORDER — SACCHAROMYCES BOULARDII 250 MG PO CAPS
250.0000 mg | ORAL_CAPSULE | Freq: Two times a day (BID) | ORAL | Status: DC
  Administered 2015-02-28 – 2015-03-01 (×3): 250 mg via ORAL
  Filled 2015-02-28 (×3): qty 1

## 2015-02-28 MED ORDER — DM-GUAIFENESIN ER 30-600 MG PO TB12
1.0000 | ORAL_TABLET | Freq: Two times a day (BID) | ORAL | Status: DC
  Administered 2015-02-28 – 2015-03-01 (×3): 1 via ORAL
  Filled 2015-02-28 (×4): qty 1

## 2015-02-28 NOTE — Progress Notes (Signed)
[facility] Progress Note   Angela Page  [id number]  DOB: 1970-10-12  DOA: 02/25/2015 PCP: No PCP Per Patient  Brief narrative: Angela Page is a 45 y.o. 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 She was admitted on 1/11 through 1/14 and treated for sepsis secondary to an enterococcus UTI and right lower lobe pneumonia. She was discharged on 7 days of Levaquin to complete her treatment which she finishes the day before presenting back to the hospital.. She states that her cough never went away completely and then began to grow worse. She developed a low-grade fever of 100 resulting in her presenting to the hospital once again. She has a permanent Foley catheter and has not had any significant urinary symptoms.  Subjective: Continues to have a congested sounding cough. No nausea vomiting or diarrhea.  Assessment/Plan: Active Problems:   Quadriplegia, C5-C7 incomplete (HCC)   HCAP (healthcare-associated pneumonia)   UTI (lower urinary tract infection)   Hypotension   Anemia   Pressure ulcer  Cough, dyspnea, low-grade fever-HCAP -Chest x-ray repeated this morning- Continues to have a right basilar infiltrate and ongoing cough with congestion -Currently receiving vancomycin and cefepime as she completed a course of Levaquin already without resolution and cough -As MRSA PCR is negative, vancomycin discontinued -High risk for C. difficile colitis-add biotic -Continue to follow symptoms-currently on room air without any complaints of dyspnea  UTI? -She has a chronic Foley catheter, it is difficult to tell whether she has a UTI especially as she did not have any urinary symptoms -Urinary culture is noncontributory -Continue antibiotics mentioned above  Chronic hypotension -Continue midodrine  Chronic anemia -Continue  iron supplements  Quadriplegia -Able to move her arms but not her hands and her legs -Receiving baclofen  Every protein calorie malnutrition -Receiving supplements  Chronic pressure ulcers -Dressing changes per RN/wound care-she follows with plastics(Dr Vernona Rieger) that [facility] in [place]  Antibiotics: Anti-infectives    Start     Dose/Rate Route Frequency Ordered Stop   02/26/15 0800  vancomycin (VANCOCIN) 500 mg in sodium chloride 0.9 % 100 mL IVPB  Status:  Discontinued     500 mg 100 mL/hr over 60 Minutes Intravenous Every 12 hours 02/25/15 1757 02/27/15 1329   02/25/15 1830  vancomycin (VANCOCIN) IVPB 1000 mg/200 mL premix     1,000 mg 200 mL/hr over 60 Minutes Intravenous  Once 02/25/15 1757 02/25/15 2227   02/25/15 1800  ceFEPIme (MAXIPIME) 1 g in dextrose 5 % 50 mL IVPB     1 g 100 mL/hr over 30 Minutes Intravenous Every 8 hours 02/25/15 1756       Code Status:     Code Status Orders        Start     Ordered   02/25/15 2051  Full code   Continuous     02/25/15 2050    Code Status History    Date Active Date Inactive Code Status Order ID Comments User Context   01/31/2015 10:18 PM 02/03/2015  5:41 PM Full Code [id number]  Theressa Millard, MD Inpatient   10/29/2014  6:59 PM 10/30/2014  4:58 PM Full Code [id number]  Janece Canterbury, MD Inpatient   10/22/2014 10:30 PM 10/26/2014  5:52 PM Full Code [id number]  Theressa Millard, MD Inpatient   09/02/2014  3:28 AM 09/06/2014  3:26 PM Full Code [id number]  Etta Quill, DO  ED   08/02/2014  3:40 AM 08/04/2014  8:06 PM Full Code [id number]  Rise Patience, MD Inpatient    Advance Directive Documentation        Most Recent Value   Type of Advance Directive  Healthcare Power of Attorney   Pre-existing out of facility DNR order (yellow form or pink MOST form)     "MOST" Form in Place?       Family Communication:  Disposition Plan: home tomorrow if stable DVT prophylaxis: Lovenox Consultants: none Procedures:      Objective: Filed Weights   02/25/15 2100  Weight: 42.5 kg (93 lb 11.1 oz)    Intake/Output Summary (Last 24 hours) at 02/28/15 1227 Last data filed at 02/28/15 1043  Gross per 24 hour  Intake 2011.25 ml  Output   1900 ml  Net 111.25 ml     Vitals Filed Vitals:   02/28/15 0600 02/28/15 0813 02/28/15 0942 02/28/15 1000  BP: 96/59 108/52  131/85  Pulse:      Temp:  98.5 F (36.9 C)    TempSrc:  Oral    Resp: 14 15  17   Height:      Weight:      SpO2: 95% 97% 98% 96%    Exam:  General:  Pt is alert, not in acute distress  HEENT: No icterus, No thrush, oral mucosa moist  Cardiovascular: regular rate and rhythm, S1/S2 No murmur  Respiratory: clear to auscultation bilaterally   Abdomen: Soft, +Bowel sounds, non tender, non distended, no guarding  MSK: No cyanosis or clubbing- no pedal edema   Data Reviewed: Basic Metabolic Panel:  Recent Labs Lab 02/25/15 1510 02/26/15 0402 02/27/15 0403 02/28/15 0354  NA 135 139 141 135  K 5.1 3.9 3.9 3.6  CL 100* 106 108 104  CO2 25 23 24  21*  GLUCOSE 77 83 94 91  BUN 12 11 10 7   CREATININE 0.51 0.57 0.35* 0.44  CALCIUM 8.9 8.9 8.8* 8.6*  MG  --   --  1.9  --   PHOS  --   --  3.6  --    Liver Function Tests:  Recent Labs Lab 02/25/15 1510  AST 18  ALT 7*  ALKPHOS 132*  BILITOT 1.5*  PROT 6.9  ALBUMIN 2.6*   No results for input(s): LIPASE, AMYLASE in the last 168 hours. No results for input(s): AMMONIA in the last 168 hours. CBC:  Recent Labs Lab 02/25/15 1510 02/26/15 0402 02/28/15 0354  WBC 10.8* 9.0 5.9  NEUTROABS 8.2*  --   --   HGB 11.5* 10.5* 10.7*  HCT 35.3* 33.4* 34.2*  MCV 89.4 91.3 90.0  PLT 473* 438* 374   Cardiac Enzymes: No results for input(s): CKTOTAL, CKMB, CKMBINDEX, TROPONINI in the last 168 hours. BNP (last 3 results) No results for input(s): BNP in the last 8760 hours.  ProBNP (last 3 results) No results for input(s): PROBNP in the last 8760 hours.  CBG: No  results for input(s): GLUCAP in the last 168 hours.  Recent Results (from the past 240 hour(s))  Blood culture (routine x 2)     Status: None (Preliminary result)   Collection Time: 02/25/15  3:10 PM  Result Value Ref Range Status   Specimen Description BLOOD RIGHT ARM  Final   Special Requests BOTTLES DRAWN AEROBIC AND ANAEROBIC 5CC  Final   Culture   Final    NO GROWTH 2 DAYS Performed at [facility]    Report Status  PENDING  Incomplete  Urine culture     Status: None   Collection Time: 02/25/15  3:29 PM  Result Value Ref Range Status   Specimen Description URINE, CATHETERIZED  Final   Special Requests NONE  Final   Culture   Final    MULTIPLE SPECIES PRESENT, SUGGEST RECOLLECTION Performed at [facility]    Report Status 02/27/2015 FINAL  Final  Blood culture (routine x 2)     Status: None (Preliminary result)   Collection Time: 02/25/15  4:04 PM  Result Value Ref Range Status   Specimen Description BLOOD LEFT HAND  Final   Special Requests IN PEDIATRIC BOTTLE 3CC  Final   Culture   Final    NO GROWTH 2 DAYS Performed at [facility]    Report Status PENDING  Incomplete  MRSA PCR Screening     Status: None   Collection Time: 02/25/15  9:26 PM  Result Value Ref Range Status   MRSA by PCR NEGATIVE NEGATIVE Final    Comment:        The GeneXpert MRSA Assay (FDA approved for NASAL specimens only), is one component of a comprehensive MRSA colonization surveillance program. It is not intended to diagnose MRSA infection nor to guide or monitor treatment for MRSA infections.   Culture, sputum-assessment     Status: None   Collection Time: 02/26/15  1:02 PM  Result Value Ref Range Status   Specimen Description SPUTUM  Final   Special Requests NONE  Final   Sputum evaluation   Final    MICROSCOPIC FINDINGS SUGGEST THAT THIS SPECIMEN IS NOT REPRESENTATIVE OF LOWER RESPIRATORY SECRETIONS. PLEASE RECOLLECT. INFORMED SHEPHERD,G. @1340  ON 2.6.17 BY  MCCOY,N.    Report Status 02/26/2015 FINAL  Final     Studies: Dg Chest Port 1 View  02/28/2015  CLINICAL DATA:  Cough, congestion EXAM: PORTABLE CHEST 1 VIEW COMPARISON:  02/25/2015 FINDINGS: Cardiac shadow is stable. Pacing device is again noted and stable. Lungs are well aerated with the exception of persistent changes in the medial right lung base consistent with atelectasis and effusion. Patient rotation is noted accentuating the mediastinal markings. No acute bony abnormality is noted. IMPRESSION: Persistent right basilar changes. Electronically Signed   By: Inez Catalina M.D.   On: 02/28/2015 10:10    Scheduled Meds:  Scheduled Meds: . albuterol  2.5 mg Nebulization TID  . baclofen  5 mg Oral TID  . ceFEPime (MAXIPIME) IV  1 g Intravenous Q8H  . dextromethorphan-guaiFENesin  1 tablet Oral BID  . diphenhydrAMINE  50 mg Oral QHS  . docusate sodium  100 mg Oral BID  . DULoxetine  30 mg Oral BID  . enoxaparin (LOVENOX) injection  30 mg Subcutaneous Q24H  . feeding supplement (ENSURE ENLIVE)  237 mL Oral TID BM  . ferrous sulfate  325 mg Oral TID WC  . gabapentin  400 mg Oral TID  . midodrine  5 mg Oral TID WC  . cyanocobalamin  1,000 mcg Oral Daily   Continuous Infusions:   Time spent on care of this patient: 57 min   [place], MD 02/28/2015, 12:27 PM  LOS: 3 days   [facility] Office  [phone] Pager - Text Page per [email or web address] If 7PM-7AM, please contact night-coverage [email or web address]

## 2015-02-28 NOTE — Progress Notes (Signed)
[facility] for Vancomycin & Cefepime Indication: pneumonia  Allergies  Allergen Reactions  . Erythromycin Nausea And Vomiting  . Nitrofurantoin Monohyd Macro Nausea And Vomiting  . Penicillins Hives    Has patient had a PCN reaction causing immediate rash, facial/tongue/throat swelling, SOB or lightheadedness with hypotension: Yes Has patient had a PCN reaction causing severe rash involving mucus membranes or skin necrosis: Yes Has patient had a PCN reaction that required hospitalization Yes- went to the DR Has patient had a PCN reaction occurring within the last 10 years: No-childhood allergy If all of the above answers are "NO", then may proceed with Cephalosporin use.    Patient Measurements: Height: 5\' 2"  (157.5 cm) Weight: 93 lb 11.1 oz (42.5 kg) IBW/kg (Calculated) : 50.1 Total body weight: 42.2 kg  Vital Signs: Temp: 98.5 F (36.9 C) (02/08 0813) Temp Source: Oral (02/08 0813) BP: 118/84 mmHg (02/08 1225) Intake/Output from previous day: 02/07 0701 - 02/08 0700 In: 2465 [P.O.:340; I.V.:1875; IV Piggyback:250] Out: 1975 [id number] Intake/Output from this shift: Total I/O In: 221.3 [I.V.:171.3; IV Piggyback:50] Out: 325 [Urine:325]  Labs:  Recent Labs  02/25/15 1510 02/26/15 0402 02/27/15 0403 02/28/15 0354  WBC 10.8* 9.0  --  5.9  HGB 11.5* 10.5*  --  10.7*  PLT 473* 438*  --  374  CREATININE 0.51 0.57 0.35* 0.44   Estimated Creatinine Clearance: 60.2 mL/min (by C-G formula based on Cr of 0.44). No results for input(s): VANCOTROUGH, VANCOPEAK, VANCORANDOM, GENTTROUGH, GENTPEAK, GENTRANDOM, TOBRATROUGH, TOBRAPEAK, TOBRARND, AMIKACINPEAK, AMIKACINTROU, AMIKACIN in the last 72 hours.   Microbiology: Recent Results (from the past 720 hour(s))  Blood Culture (routine x 2)     Status: None   Collection Time: 01/31/15  5:29 PM  Result Value Ref Range Status   Specimen Description BLOOD LEFT ANTECUBITAL  Final   Special  Requests BOTTLES DRAWN AEROBIC AND ANAEROBIC 10 CC  Final   Culture   Final    NO GROWTH 5 DAYS Performed at [facility]    Report Status 02/05/2015 FINAL  Final  Blood Culture (routine x 2)     Status: None   Collection Time: 01/31/15  5:54 PM  Result Value Ref Range Status   Specimen Description BLOOD RIGHT HAND  Final   Special Requests BOTTLES DRAWN AEROBIC AND ANAEROBIC 10CC  Final   Culture   Final    NO GROWTH 5 DAYS Performed at [facility]    Report Status 02/06/2015 FINAL  Final  Urine culture     Status: None   Collection Time: 01/31/15  6:47 PM  Result Value Ref Range Status   Specimen Description URINE, CATHETERIZED  Final   Special Requests NONE  Final   Culture   Final    >=100,000 COLONIES/mL ENTEROCOCCUS SPECIES Performed at [facility]    Report Status 02/03/2015 FINAL  Final   Organism ID, Bacteria ENTEROCOCCUS SPECIES  Final      Susceptibility   Enterococcus species - MIC*    AMPICILLIN <=2 SENSITIVE Sensitive     LEVOFLOXACIN 1 SENSITIVE Sensitive     NITROFURANTOIN <=16 SENSITIVE Sensitive     VANCOMYCIN 1 SENSITIVE Sensitive     * >=100,000 COLONIES/mL ENTEROCOCCUS SPECIES  Blood culture (routine x 2)     Status: None (Preliminary result)   Collection Time: 02/25/15  3:10 PM  Result Value Ref Range Status   Specimen Description BLOOD RIGHT ARM  Final   Special Requests BOTTLES  DRAWN AEROBIC AND ANAEROBIC 5CC  Final   Culture   Final    NO GROWTH 2 DAYS Performed at [facility]    Report Status PENDING  Incomplete  Urine culture     Status: None   Collection Time: 02/25/15  3:29 PM  Result Value Ref Range Status   Specimen Description URINE, CATHETERIZED  Final   Special Requests NONE  Final   Culture   Final    MULTIPLE SPECIES PRESENT, SUGGEST RECOLLECTION Performed at [facility]    Report Status 02/27/2015 FINAL  Final  Blood culture (routine x 2)     Status: None (Preliminary result)    Collection Time: 02/25/15  4:04 PM  Result Value Ref Range Status   Specimen Description BLOOD LEFT HAND  Final   Special Requests IN PEDIATRIC BOTTLE 3CC  Final   Culture   Final    NO GROWTH 2 DAYS Performed at [facility]    Report Status PENDING  Incomplete  MRSA PCR Screening     Status: None   Collection Time: 02/25/15  9:26 PM  Result Value Ref Range Status   MRSA by PCR NEGATIVE NEGATIVE Final    Comment:        The GeneXpert MRSA Assay (FDA approved for NASAL specimens only), is one component of a comprehensive MRSA colonization surveillance program. It is not intended to diagnose MRSA infection nor to guide or monitor treatment for MRSA infections.   Culture, sputum-assessment     Status: None   Collection Time: 02/26/15  1:02 PM  Result Value Ref Range Status   Specimen Description SPUTUM  Final   Special Requests NONE  Final   Sputum evaluation   Final    MICROSCOPIC FINDINGS SUGGEST THAT THIS SPECIMEN IS NOT REPRESENTATIVE OF LOWER RESPIRATORY SECRETIONS. PLEASE RECOLLECT. INFORMED SHEPHERD,G. @1340  ON 2.6.17 BY MCCOY,N.    Report Status 02/26/2015 FINAL  Final   Medical History: Past Medical History  Diagnosis Date  . Asthma   . Pleurisy   . GERD (gastroesophageal reflux disease)   . Quadriparesis ([place])   . Pacemaker    Medications:  Scheduled:  Anti-infectives    Start     Dose/Rate Route Frequency Ordered Stop   02/26/15 0800  vancomycin (VANCOCIN) 500 mg in sodium chloride 0.9 % 100 mL IVPB  Status:  Discontinued     500 mg 100 mL/hr over 60 Minutes Intravenous Every 12 hours 02/25/15 1757 02/27/15 1329   02/25/15 1830  vancomycin (VANCOCIN) IVPB 1000 mg/200 mL premix     1,000 mg 200 mL/hr over 60 Minutes Intravenous  Once 02/25/15 1757 02/25/15 2227   02/25/15 1800  ceFEPIme (MAXIPIME) 1 g in dextrose 5 % 50 mL IVPB     1 g 100 mL/hr over 30 Minutes Intravenous Every 8 hours 02/25/15 1756       Assessment: 15 yoF quadriplegic  admitted with HCAP.  She continues on Cefepime.  VSS and renal unchanged.    Goal of Therapy:  Eradicate infection.  Plan:   Continue Cefepime 1gm q8h  Anticipate discharge tomorrow.  No further dose adjustments anticipated.  Pharmacy to sign off.   Netta Cedars, PharmD, BCPS Pager: [phone] 02/28/2015, 1:38 PM

## 2015-03-01 DIAGNOSIS — D649 Anemia, unspecified: Secondary | ICD-10-CM

## 2015-03-01 DIAGNOSIS — L8990 Pressure ulcer of unspecified site, unspecified stage: Secondary | ICD-10-CM

## 2015-03-01 MED ORDER — DM-GUAIFENESIN ER 30-600 MG PO TB12: 1.0000 | ORAL_TABLET | Freq: Two times a day (BID) | ORAL | Status: DC

## 2015-03-01 MED ORDER — SODIUM CHLORIDE 0.9 % IV BOLUS (SEPSIS)
500.0000 mL | Freq: Once | INTRAVENOUS | Status: AC
  Administered 2015-03-01: 500 mL via INTRAVENOUS

## 2015-03-01 MED ORDER — SACCHAROMYCES BOULARDII 250 MG PO CAPS
250.0000 mg | ORAL_CAPSULE | Freq: Two times a day (BID) | ORAL | Status: DC
Start: 2015-03-01 — End: 2015-09-22

## 2015-03-01 MED ORDER — CEFUROXIME AXETIL 500 MG PO TABS: 500.0000 mg | ORAL_TABLET | Freq: Two times a day (BID) | ORAL | Status: DC

## 2015-03-01 NOTE — Progress Notes (Signed)
Discharge paperwork reviewed with patient. Patient verbalized understanding of information. RN called patient's pharmacy and verified that they had received patient's prescription(s). Patient discharged and left via [facility] at 1235.

## 2015-03-01 NOTE — Plan of Care (Signed)
Problem: Skin Integrity: Goal: Risk for impaired skin integrity will decrease Outcome: Not Met (add Reason) Patient quadraplegic, unable to move without assistance

## 2015-03-01 NOTE — Discharge Instructions (Signed)
Please follow up with your Primary care doctor in 1 week or earlier if you are not doing well.

## 2015-03-01 NOTE — Discharge Summary (Signed)
Physician Discharge Summary  Rodriguez Camp Sandoval [id number] DOB: Feb 19, 1970 DOA: 02/25/2015  PCP: No PCP Per Patient  Admit date: 02/25/2015 Discharge date: 03/01/2015  Time spent: 50 minutes    Discharge Condition: stable    Discharge Diagnoses:  Principal Problem:   HCAP (healthcare-associated pneumonia) Active Problems:   Quadriplegia, C5-C7 incomplete ([place])   UTI (lower urinary tract infection)   Hypotension   Anemia   Pressure ulcer   History of present illness:  Angela Page is a 45 y.o. 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 She was admitted on 1/11 through 1/14 and treated for sepsis secondary to an enterococcus UTI and right lower lobe pneumonia. She was discharged on 7 days of Levaquin to complete her treatment which she finished the day before presenting back to the hospital.. She states that her cough never went away completely and then began to grow worse. She developed a low-grade fever of 100 resulting in her presenting to the hospital once again. She has a permanent Foley catheter and has not had any significant urinary symptoms.  Hospital Course:  Cough, dyspnea, low-grade fever-HCAP - No fever noted in the hospital and no leukocytosis - Chest x-rays have been showing a right lower lobe infiltrate -Chest x-ray repeated 2/9- Continues to have a very mild right basilar infiltrate and symptoms ongoing cough with congestion -She was admitted and started on vancomycin and cefepime as she had completed a course of Levaquin already without resolution of symptoms -As MRSA PCR is negative, vancomycin was discontinued on 2/9 -Will transition cefepime to Ceftin today and discharge with a prescription for 5 more days which will give her a total of a ten-day course this time -She states that her cough and congestion have  improved during the hospital stay -High risk for C. difficile colitis-added pro-biotic -currently on room air without any complaints of dyspnea -I have told her to follow-up with her PCP in 1 week and follow closely for recurrent fevers  UTI? -She has a chronic Foley catheter, it is difficult to tell whether she has a UTI especially as she did not have any urinary symptoms -Urinary culture is noncontributory and has grown out multiple organisms -Continue antibiotics mentioned above  Chronic hypotension -Continue midodrine  Chronic anemia -Continue iron supplements  Quadriplegia -Able to move her arms but not her hands and her legs -Receiving baclofen  Every protein calorie malnutrition -Receiving supplements  Chronic pressure ulcers -Dressing changes per RN/wound care-she follows with plastics(Dr Vernona Rieger) at [facility] in [place] and has a follow-up appointment coming soon   Discharge Exam: Filed Weights   02/25/15 2100  Weight: 42.5 kg (93 lb 11.1 oz)   Filed Vitals:   03/01/15 0800 03/01/15 1000  BP: 86/64 93/71  Pulse:    Temp: 97.6 F (36.4 C)   Resp: 15 12    General: AAO x 3, no distress Cardiovascular: RRR, no murmurs  Respiratory: clear to auscultation bilaterally GI: soft, non-tender, non-distended, bowel sound positive  Discharge Instructions You were cared for by a hospitalist during your hospital stay. If you have any questions about your discharge medications or the care you received while you were in the hospital after you are discharged, you can call the unit and asked to speak with the hospitalist on call if the hospitalist that took care of you is not available. Once you are discharged, your primary care  physician will handle any further medical issues. Please note that NO REFILLS for any discharge medications will be authorized once you are discharged, as it is imperative that you return to your primary care physician (or establish a relationship  with a primary care physician if you do not have one) for your aftercare needs so that they can reassess your need for medications and monitor your lab values.      Discharge Instructions    Call MD for:  difficulty breathing, headache or visual disturbances    Complete by:  As directed      Call MD for:  temperature >100.4    Complete by:  As directed      Diet - low sodium heart healthy    Complete by:  As directed      Increase activity slowly    Complete by:  As directed             Medication List    TAKE these medications        acetaminophen 325 MG tablet  Commonly known as:  TYLENOL  Take 2 tablets (650 mg total) by mouth every 6 (six) hours as needed for fever.     albuterol 108 (90 Base) MCG/ACT inhaler  Commonly known as:  PROVENTIL HFA;VENTOLIN HFA  Inhale 2 puffs into the lungs every 6 (six) hours as needed. For shortness of breath     baclofen 10 MG tablet  Commonly known as:  LIORESAL  Take 1 tablet (10 mg total) by mouth 3 (three) times daily.     cefUROXime 500 MG tablet  Commonly known as:  CEFTIN  Take 1 tablet (500 mg total) by mouth 2 (two) times daily with a meal.     [employer] VITAMIN B12 1000 MCG tablet  Generic drug:  cyanocobalamin  Take 1 tablet by mouth daily.     dextromethorphan-guaiFENesin 30-600 MG 12hr tablet  Commonly known as:  MUCINEX DM  Take 1 tablet by mouth 2 (two) times daily.     doxycycline 100 MG tablet  Commonly known as:  VIBRA-TABS  Take 100 mg by mouth every 12 (twelve) hours. continous     DULoxetine 30 MG capsule  Commonly known as:  CYMBALTA  Take 30 mg by mouth 2 (two) times daily.     feeding supplement (PRO-STAT SUGAR FREE 64) Liqd  Take 30 mLs by mouth 2 (two) times daily.     ferrous sulfate 325 (65 FE) MG tablet  Take 1 tablet (325 mg total) by mouth 3 (three) times daily.     gabapentin 400 MG capsule  Commonly known as:  NEURONTIN  Take 400 mg by mouth 3 (three) times daily.     ibuprofen 200 MG tablet   Commonly known as:  ADVIL,MOTRIN  Take 400 mg by mouth every 6 (six) hours as needed for moderate pain.     midodrine 5 MG tablet  Commonly known as:  PROAMATINE  Take 5 mg by mouth 3 (three) times daily with meals.     oxycodone 5 MG capsule  Commonly known as:  OXY-IR  Take 5 mg by mouth every 4 (four) hours as needed for pain.     saccharomyces boulardii 250 MG capsule  Commonly known as:  FLORASTOR  Take 1 capsule (250 mg total) by mouth 2 (two) times daily.     silver sulfADIAZINE 1 % cream  Commonly known as:  SILVADENE  Apply 1 application topically every other day as needed (  for wound care).     tiZANidine 4 MG tablet  Commonly known as:  ZANAFLEX  Take 4 mg by mouth every 8 (eight) hours as needed. Muscle spasms.     zinc oxide 11.3 % Crea cream  Commonly known as:  BALMEX  Apply 1 application topically 3 (three) times daily as needed (for skin redness/irritation).       Allergies  Allergen Reactions  . Erythromycin Nausea And Vomiting  . Nitrofurantoin Monohyd Macro Nausea And Vomiting  . Penicillins Hives    Has patient had a PCN reaction causing immediate rash, facial/tongue/throat swelling, SOB or lightheadedness with hypotension: Yes Has patient had a PCN reaction causing severe rash involving mucus membranes or skin necrosis: Yes Has patient had a PCN reaction that required hospitalization Yes- went to the DR Has patient had a PCN reaction occurring within the last 10 years: No-childhood allergy If all of the above answers are "NO", then may proceed with Cephalosporin use.       The results of significant diagnostics from this hospitalization (including imaging, microbiology, ancillary and laboratory) are listed below for reference.    Significant Diagnostic Studies: Dg Chest 2 View  02/25/2015  CLINICAL DATA:  Patient with wheezing and productive cough. EXAM: CHEST  2 VIEW COMPARISON:  Chest radiograph 01/31/2015. FINDINGS: Dual lead pacer apparatus  overlies the left hemi thorax, leads are stable in position. Stable cardiac and mediastinal contours. Interval increase in right lower lung consolidation suggestive of right lower lobe atelectasis and associated consolidative opacities. No pleural effusion or pneumothorax. Thoracic spine degenerative changes. Lateral view nondiagnostic due to overlapping soft tissue. IMPRESSION: Findings most compatible with right lower lobe atelectasis and associated consolidation concerning for superimposed infectious process. Electronically Signed   By: Lovey Newcomer M.D.   On: 02/25/2015 16:05   Dg Chest Port 1 View  02/28/2015  CLINICAL DATA:  Cough, congestion EXAM: PORTABLE CHEST 1 VIEW COMPARISON:  02/25/2015 FINDINGS: Cardiac shadow is stable. Pacing device is again noted and stable. Lungs are well aerated with the exception of persistent changes in the medial right lung base consistent with atelectasis and effusion. Patient rotation is noted accentuating the mediastinal markings. No acute bony abnormality is noted. IMPRESSION: Persistent right basilar changes. Electronically Signed   By: Inez Catalina M.D.   On: 02/28/2015 10:10   Dg Chest Port 1 View  01/31/2015  CLINICAL DATA:  Fever, chills, and cough for 3 days.  Asthma. EXAM: PORTABLE CHEST 1 VIEW COMPARISON:  10/29/2014 FINDINGS: Dual lead transvenous pacemaker remains in appropriate position. Heart size is normal. New asymmetric airspace opacity is seen in right lower lung, suspicious for pneumonia. Left lung is clear. No evidence of pneumothorax or pleural effusion. IMPRESSION: New right lower lung airspace disease, suspicious for pneumonia. Followup PA and lateral chest X-ray is recommended in 3-4 weeks following trial of antibiotic therapy to ensure resolution. Electronically Signed   By: Earle Gell M.D.   On: 01/31/2015 17:19    Microbiology: Recent Results (from the past 240 hour(s))  Blood culture (routine x 2)     Status: None (Preliminary result)    Collection Time: 02/25/15  3:10 PM  Result Value Ref Range Status   Specimen Description BLOOD RIGHT ARM  Final   Special Requests BOTTLES DRAWN AEROBIC AND ANAEROBIC 5CC  Final   Culture   Final    NO GROWTH 3 DAYS Performed at [facility]    Report Status PENDING  Incomplete  Urine culture  Status: None   Collection Time: 02/25/15  3:29 PM  Result Value Ref Range Status   Specimen Description URINE, CATHETERIZED  Final   Special Requests NONE  Final   Culture   Final    MULTIPLE SPECIES PRESENT, SUGGEST RECOLLECTION Performed at [facility]    Report Status 02/27/2015 FINAL  Final  Blood culture (routine x 2)     Status: None (Preliminary result)   Collection Time: 02/25/15  4:04 PM  Result Value Ref Range Status   Specimen Description BLOOD LEFT HAND  Final   Special Requests IN PEDIATRIC BOTTLE 3CC  Final   Culture   Final    NO GROWTH 3 DAYS Performed at [facility]    Report Status PENDING  Incomplete  MRSA PCR Screening     Status: None   Collection Time: 02/25/15  9:26 PM  Result Value Ref Range Status   MRSA by PCR NEGATIVE NEGATIVE Final    Comment:        The GeneXpert MRSA Assay (FDA approved for NASAL specimens only), is one component of a comprehensive MRSA colonization surveillance program. It is not intended to diagnose MRSA infection nor to guide or monitor treatment for MRSA infections.   Culture, sputum-assessment     Status: None   Collection Time: 02/26/15  1:02 PM  Result Value Ref Range Status   Specimen Description SPUTUM  Final   Special Requests NONE  Final   Sputum evaluation   Final    MICROSCOPIC FINDINGS SUGGEST THAT THIS SPECIMEN IS NOT REPRESENTATIVE OF LOWER RESPIRATORY SECRETIONS. PLEASE RECOLLECT. INFORMED SHEPHERD,G. @1340  ON 2.6.17 BY MCCOY,N.    Report Status 02/26/2015 FINAL  Final     Labs: Basic Metabolic Panel:  Recent Labs Lab 02/25/15 1510 02/26/15 0402 02/27/15 0403 02/28/15 0354   NA 135 139 141 135  K 5.1 3.9 3.9 3.6  CL 100* 106 108 104  CO2 25 23 24  21*  GLUCOSE 77 83 94 91  BUN 12 11 10 7   CREATININE 0.51 0.57 0.35* 0.44  CALCIUM 8.9 8.9 8.8* 8.6*  MG  --   --  1.9  --   PHOS  --   --  3.6  --    Liver Function Tests:  Recent Labs Lab 02/25/15 1510  AST 18  ALT 7*  ALKPHOS 132*  BILITOT 1.5*  PROT 6.9  ALBUMIN 2.6*   No results for input(s): LIPASE, AMYLASE in the last 168 hours. No results for input(s): AMMONIA in the last 168 hours. CBC:  Recent Labs Lab 02/25/15 1510 02/26/15 0402 02/28/15 0354  WBC 10.8* 9.0 5.9  NEUTROABS 8.2*  --   --   HGB 11.5* 10.5* 10.7*  HCT 35.3* 33.4* 34.2*  MCV 89.4 91.3 90.0  PLT 473* 438* 374   Cardiac Enzymes: No results for input(s): CKTOTAL, CKMB, CKMBINDEX, TROPONINI in the last 168 hours. BNP: BNP (last 3 results) No results for input(s): BNP in the last 8760 hours.  ProBNP (last 3 results) No results for input(s): PROBNP in the last 8760 hours.  CBG: No results for input(s): GLUCAP in the last 168 hours.     SignedDebbe Odea, MD [facility] 03/01/2015, 11:43 AM

## 2015-03-02 LAB — CULTURE, BLOOD (ROUTINE X 2)
CULTURE: NO GROWTH
Culture: NO GROWTH

## 2015-05-05 IMAGING — CR DG CHEST 2V
2 series · 2 of 2 positions shown · non-contrast
Comparison: Chest radiograph, [DATE].

CLINICAL DATA: Left-sided chest pain radiating into the back.

EXAM:
CHEST  2 VIEW

[w chest pa]
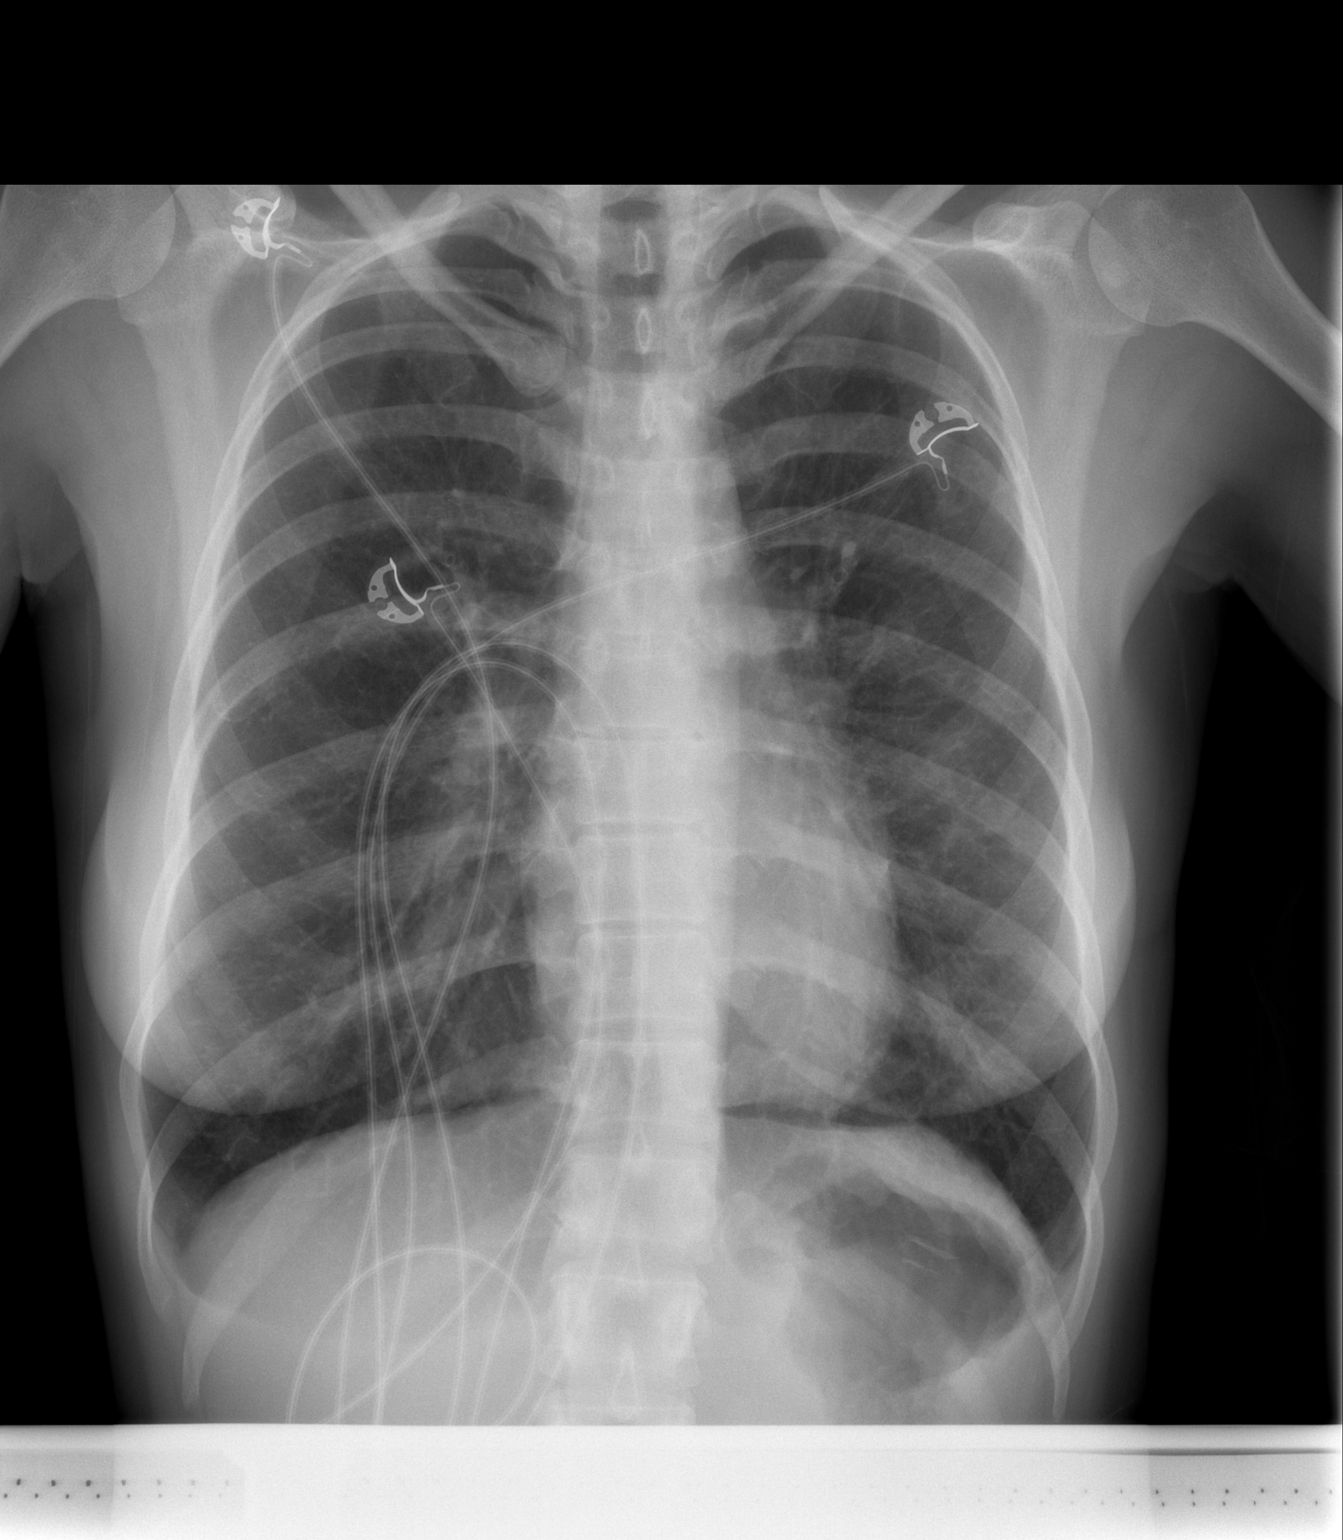

[w chest lat]
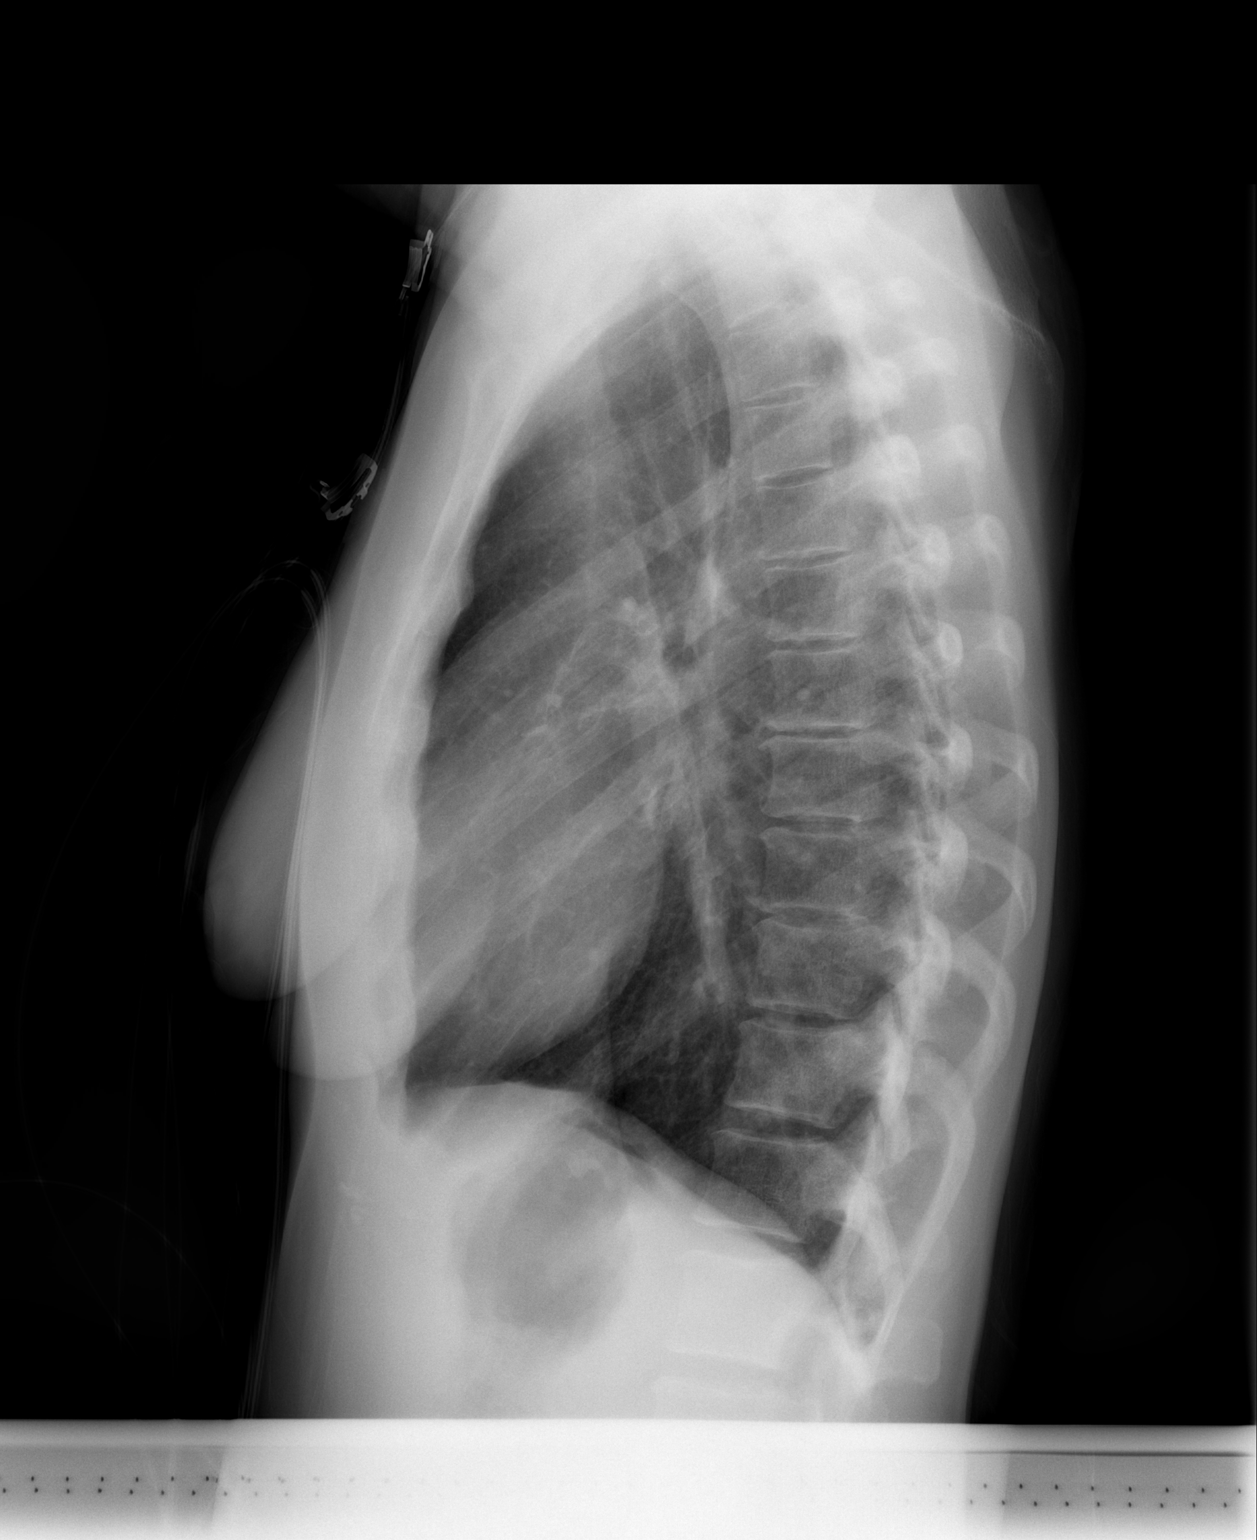

[2 of 2 positions shown; findings below may reference images not displayed]

FINDINGS: The heart size and mediastinal contours are within normal limits.
Lungs are hyperexpanded but clear. No pleural effusion or
pneumothorax. The visualized skeletal structures are unremarkable.
IMPRESSION: No active cardiopulmonary disease.

## 2015-05-07 ENCOUNTER — Encounter (HOSPITAL_COMMUNITY): Payer: Self-pay | Admitting: Emergency Medicine

## 2015-05-07 ENCOUNTER — Emergency Department (HOSPITAL_COMMUNITY): Payer: Medicaid Other

## 2015-05-07 ENCOUNTER — Emergency Department (HOSPITAL_COMMUNITY)
Admission: EM | Admit: 2015-05-07 | Discharge: 2015-05-07 | Disposition: A | Discharge status: Home / Self Care | Payer: Medicaid Other | Attending: Emergency Medicine | Admitting: Emergency Medicine

## 2015-05-07 DIAGNOSIS — Z3202 Encounter for pregnancy test, result negative: Secondary | ICD-10-CM | POA: Diagnosis not present

## 2015-05-07 DIAGNOSIS — R109 Unspecified abdominal pain: Secondary | ICD-10-CM | POA: Diagnosis present

## 2015-05-07 DIAGNOSIS — Z792 Long term (current) use of antibiotics: Secondary | ICD-10-CM | POA: Insufficient documentation

## 2015-05-07 DIAGNOSIS — R05 Cough: Secondary | ICD-10-CM | POA: Insufficient documentation

## 2015-05-07 DIAGNOSIS — K5909 Other constipation: Secondary | ICD-10-CM

## 2015-05-07 DIAGNOSIS — J45909 Unspecified asthma, uncomplicated: Secondary | ICD-10-CM | POA: Insufficient documentation

## 2015-05-07 DIAGNOSIS — Z880 Allergy status to penicillin: Secondary | ICD-10-CM | POA: Diagnosis not present

## 2015-05-07 DIAGNOSIS — Z950 Presence of cardiac pacemaker: Secondary | ICD-10-CM | POA: Diagnosis not present

## 2015-05-07 DIAGNOSIS — Z79899 Other long term (current) drug therapy: Secondary | ICD-10-CM | POA: Diagnosis not present

## 2015-05-07 DIAGNOSIS — Z87891 Personal history of nicotine dependence: Secondary | ICD-10-CM | POA: Insufficient documentation

## 2015-05-07 DIAGNOSIS — Z8669 Personal history of other diseases of the nervous system and sense organs: Secondary | ICD-10-CM | POA: Diagnosis not present

## 2015-05-07 LAB — CBC WITH DIFFERENTIAL/PLATELET
BASOS ABS: 0 10*3/uL (ref 0.0–0.1)
BASOS PCT: 0 %
EOS PCT: 3 %
Eosinophils Absolute: 0.3 10*3/uL (ref 0.0–0.7)
HEMATOCRIT: 36.7 % (ref 36.0–46.0)
Hemoglobin: 12 g/dL (ref 12.0–15.0)
Lymphocytes Relative: 23 %
Lymphs Abs: 2.3 10*3/uL (ref 0.7–4.0)
MCH: 29.6 pg (ref 26.0–34.0)
MCHC: 32.7 g/dL (ref 30.0–36.0)
MCV: 90.4 fL (ref 78.0–100.0)
MONO ABS: 0.6 10*3/uL (ref 0.1–1.0)
Monocytes Relative: 6 %
Neutro Abs: 6.7 10*3/uL (ref 1.7–7.7)
Neutrophils Relative %: 68 %
PLATELETS: 429 10*3/uL — AB (ref 150–400)
RBC: 4.06 MIL/uL (ref 3.87–5.11)
RDW: 14.4 % (ref 11.5–15.5)
WBC: 9.9 10*3/uL (ref 4.0–10.5)

## 2015-05-07 LAB — COMPREHENSIVE METABOLIC PANEL
ALK PHOS: 154 U/L — AB (ref 38–126)
ALT: 12 U/L — ABNORMAL LOW (ref 14–54)
ANION GAP: 9 (ref 5–15)
AST: 15 U/L (ref 15–41)
Albumin: 2.8 g/dL — ABNORMAL LOW (ref 3.5–5.0)
BILIRUBIN TOTAL: 0.4 mg/dL (ref 0.3–1.2)
BUN: 12 mg/dL (ref 6–20)
CALCIUM: 9.6 mg/dL (ref 8.9–10.3)
CO2: 31 mmol/L (ref 22–32)
Chloride: 96 mmol/L — ABNORMAL LOW (ref 101–111)
Creatinine, Ser: 0.64 mg/dL (ref 0.44–1.00)
GFR calc non Af Amer: >60 mL/min (ref >60)
Glucose, Bld: 115 mg/dL — ABNORMAL HIGH (ref 65–99)
Potassium: 3.7 mmol/L (ref 3.5–5.1)
SODIUM: 136 mmol/L (ref 135–145)
TOTAL PROTEIN: 7.5 g/dL (ref 6.5–8.1)

## 2015-05-07 LAB — I-STAT BETA HCG BLOOD, ED (MC, WL, AP ONLY)

## 2015-05-07 LAB — I-STAT CG4 LACTIC ACID, ED: LACTIC ACID, VENOUS: 1.05 mmol/L (ref 0.5–2.0)

## 2015-05-07 MED ORDER — FLEET ENEMA 7-19 GM/118ML RE ENEM
1.0000 | ENEMA | Freq: Once | RECTAL | Status: AC
  Administered 2015-05-07: 1 via RECTAL
  Filled 2015-05-07: qty 1

## 2015-05-07 NOTE — ED Notes (Addendum)
Pt from home. Hx of Paraplegia. Pt reports that she has not had a BM in 10 despite trying suppositories and dulcolax. Pt feels her abd is distended. Pt also having intermittent episodes of wheezing and coughing. Wants to be checked out for PNA. Pt's BP in 80s on arrival. Pt assymptomatic. Pt reports this is normal for her, yet past charts show BPs higher than this.

## 2015-05-07 NOTE — ED Notes (Signed)
Bed: WA01 Expected date:  Expected time:  Means of arrival:  Comments: EMS- quadriplegic

## 2015-05-07 NOTE — Discharge Instructions (Signed)

## 2015-05-07 NOTE — ED Notes (Signed)
Pt defecated 2 medium pieces of feces after enema

## 2015-05-07 NOTE — ED Notes (Signed)
Patient transported to X-ray 

## 2015-05-07 NOTE — ED Notes (Signed)
Bed: WHALA Expected date:  Expected time:  Means of arrival:  Comments: 

## 2015-05-07 NOTE — ED Provider Notes (Signed)
CSN: [id number]     Arrival date & time 05/07/15  1344 History   First MD Initiated Contact with Patient 05/07/15 1503     Chief Complaint  Patient presents with  . Constipation  . Cough     (Consider location/radiation/quality/duration/timing/severity/associated sxs/prior Treatment) HPI Comments: H/o chronic constipation  Is on a bowel program w/ stool softners and laxatives H/o baseline bp of 90/50--she denies and sx of weakness or lightheadedness  Patient is a 45 y.o. female presenting with constipation and cough. The history is provided by the patient.  Constipation Severity:  Moderate Time since last bowel movement:  10 days Timing:  Constant Progression:  Worsening Chronicity:  Recurrent Stool description:  None produced Relieved by:  Nothing Worsened by:  Nothing tried Ineffective treatments:  Stool softeners and laxatives Associated symptoms: abdominal pain   Associated symptoms: no fever, no flatus, no nausea and no vomiting   Cough Associated symptoms: no fever     Past Medical History  Diagnosis Date  . Asthma   . Pleurisy   . GERD (gastroesophageal reflux disease)   . Quadriparesis ([place])   . Pacemaker    Past Surgical History  Procedure Laterality Date  . Appendectomy    . Cardiac surgery    . I&d extremity  10/28/2011    Procedure: IRRIGATION AND DEBRIDEMENT EXTREMITY;  Surgeon: Tennis Must, MD;  Location: [facility];  Service: Orthopedics;  Laterality: Right;  Irrigation and debridement right middle finger   Family History  Problem Relation Age of Onset  . Hypertension Maternal Uncle    Social History  Substance Use Topics  . Smoking status: Former Smoker -- 1.00 packs/day for 25 years    Types: Cigarettes  . Smokeless tobacco: None  . Alcohol Use: Yes     Comment: 2 x weekly   OB History    No data available     Review of Systems  Constitutional: Negative for fever.  Respiratory: Positive for cough.   Gastrointestinal:  Positive for abdominal pain and constipation. Negative for nausea, vomiting and flatus.  All other systems reviewed and are negative.     Allergies  Erythromycin; Nitrofurantoin monohyd macro; and Penicillins  Home Medications   Prior to Admission medications   Medication Sig Start Date End Date Taking? Authorizing Provider  acetaminophen (TYLENOL) 325 MG tablet Take 2 tablets (650 mg total) by mouth every 6 (six) hours as needed for fever. 09/06/14  Yes Robbie Lis, MD  albuterol (PROVENTIL HFA;VENTOLIN HFA) 108 (90 BASE) MCG/ACT inhaler Inhale 2 puffs into the lungs every 6 (six) hours as needed. For shortness of breath    Yes Historical Provider, MD  baclofen (LIORESAL) 10 MG tablet Take 1 tablet (10 mg total) by mouth 3 (three) times daily. Patient taking differently: Take 20 mg by mouth 3 (three) times daily.  10/30/14  Yes Janece Canterbury, MD  [employer] VITAMIN B12 1000 MCG tablet Take 1 tablet by mouth daily. 08/22/14  Yes Historical Provider, MD  dextromethorphan-guaiFENesin (MUCINEX DM) 30-600 MG 12hr tablet Take 1 tablet by mouth 2 (two) times daily. Patient taking differently: Take 1 tablet by mouth 2 (two) times daily as needed for cough.  03/01/15  Yes Debbe Odea, MD  doxycycline (VIBRA-TABS) 100 MG tablet Take 100 mg by mouth every 12 (twelve) hours. continous 10/12/14  Yes Historical Provider, MD  DULoxetine (CYMBALTA) 30 MG capsule Take 30 mg by mouth 2 (two) times daily. 01/06/15  Yes Historical Provider, MD  ferrous  sulfate 325 (65 FE) MG tablet Take 1 tablet (325 mg total) by mouth 3 (three) times daily. 08/04/14  Yes Janece Canterbury, MD  gabapentin (NEURONTIN) 400 MG capsule Take 400 mg by mouth 3 (three) times daily. 10/18/14  Yes Historical Provider, MD  ibuprofen (ADVIL,MOTRIN) 200 MG tablet Take 400 mg by mouth every 6 (six) hours as needed for moderate pain.   Yes Historical Provider, MD  lidocaine (LIDODERM) 5 % Place 1 patch onto the skin 2 times daily at 12 noon and 4 pm.  04/04/15  Yes Historical Provider, MD  midodrine (PROAMATINE) 5 MG tablet Take 5 mg by mouth 3 (three) times daily with meals.   Yes Historical Provider, MD  oxycodone (OXY-IR) 5 MG capsule Take 5 mg by mouth every 4 (four) hours as needed for pain.   Yes Historical Provider, MD  silver sulfADIAZINE (SILVADENE) 1 % cream Apply 1 application topically every other day as needed (for wound care).   Yes Historical Provider, MD  tiZANidine (ZANAFLEX) 4 MG tablet Take 4 mg by mouth every 8 (eight) hours as needed. Muscle spasms. 12/13/14  Yes Historical Provider, MD  zinc oxide (BALMEX) 11.3 % CREA cream Apply 1 application topically 3 (three) times daily as needed (for skin redness/irritation).   Yes Historical Provider, MD  Amino Acids-Protein Hydrolys (FEEDING SUPPLEMENT, PRO-STAT SUGAR FREE 64,) LIQD Take 30 mLs by mouth 2 (two) times daily. Patient not taking: Reported on 02/25/2015 02/03/15   Nishant Dhungel, MD  cefUROXime (CEFTIN) 500 MG tablet Take 1 tablet (500 mg total) by mouth 2 (two) times daily with a meal. Patient not taking: Reported on 05/07/2015 03/01/15   Debbe Odea, MD  saccharomyces boulardii (FLORASTOR) 250 MG capsule Take 1 capsule (250 mg total) by mouth 2 (two) times daily. Patient not taking: Reported on 05/07/2015 03/01/15   Debbe Odea, MD   BP 87/66 mmHg  Pulse 80  Temp(Src) 98.2 F (36.8 C) (Oral)  Resp 14  SpO2 92%  LMP  Physical Exam  Constitutional: She is oriented to person, place, and time. She appears well-developed and well-nourished.  Non-toxic appearance. No distress.  HENT:  Head: Normocephalic and atraumatic.  Eyes: Conjunctivae, EOM and lids are normal. Pupils are equal, round, and reactive to light.  Neck: Normal range of motion. Neck supple. No tracheal deviation present. No thyroid mass present.  Cardiovascular: Normal rate, regular rhythm and normal heart sounds.  Exam reveals no gallop.   No murmur heard. Pulmonary/Chest: Effort normal and breath sounds  normal. No stridor. No respiratory distress. She has no decreased breath sounds. She has no wheezes. She has no rhonchi. She has no rales.  Abdominal: Soft. Normal appearance and bowel sounds are normal. She exhibits distension. There is no tenderness. There is no rigidity, no rebound, no guarding and no CVA tenderness.  Musculoskeletal: Normal range of motion. She exhibits no edema or tenderness.  Neurological: She is alert and oriented to person, place, and time. No cranial nerve deficit or sensory deficit. GCS eye subscore is 4. GCS verbal subscore is 5. GCS motor subscore is 6.  Skin: Skin is warm and dry. No abrasion and no rash noted.  Psychiatric: She has a normal mood and affect. Her speech is normal and behavior is normal.  Nursing note and vitals reviewed.   ED Course  Procedures (including critical care time) Labs Review Labs Reviewed  COMPREHENSIVE METABOLIC PANEL - Abnormal; Notable for the following:    Chloride 96 (*)    Glucose,  Bld 115 (*)    Albumin 2.8 (*)    ALT 12 (*)    Alkaline Phosphatase 154 (*)    All other components within normal limits  CBC WITH DIFFERENTIAL/PLATELET - Abnormal; Notable for the following:    Platelets 429 (*)    All other components within normal limits  I-STAT BETA HCG BLOOD, ED ([facility], [facility], [facility] ONLY)  I-STAT CG4 LACTIC ACID, ED    Imaging Review No results found. I have personally reviewed and evaluated these images and lab results as part of my medical decision-making.   EKG Interpretation None      MDM   Final diagnoses:  None    Patient given a fleets enema here feels better. Stable for discharge    Lacretia Leigh, MD 05/07/15 1752

## 2015-05-07 NOTE — Progress Notes (Addendum)
Patient noted to have Medicaid insurance without a pcp.  EDCM spoke to patient at bedside.  Patient reports her pcp is located [facility] in [place].  System updated.  Patient reports she sees a Dr. Shelton Silvas.

## 2015-09-22 ENCOUNTER — Inpatient Hospital Stay (HOSPITAL_COMMUNITY)
Admission: EM | Admit: 2015-09-22 | Discharge: 2015-09-27 | DRG: 871 | Disposition: A | Discharge status: Home / Self Care | Payer: Medicaid Other | Attending: Internal Medicine | Admitting: Internal Medicine

## 2015-09-22 ENCOUNTER — Emergency Department (HOSPITAL_COMMUNITY): Payer: Medicaid Other

## 2015-09-22 ENCOUNTER — Encounter (HOSPITAL_COMMUNITY): Payer: Self-pay | Admitting: *Deleted

## 2015-09-22 DIAGNOSIS — Z87440 Personal history of urinary (tract) infections: Secondary | ICD-10-CM

## 2015-09-22 DIAGNOSIS — I472 Ventricular tachycardia: Secondary | ICD-10-CM | POA: Diagnosis not present

## 2015-09-22 DIAGNOSIS — L89314 Pressure ulcer of right buttock, stage 4: Secondary | ICD-10-CM | POA: Diagnosis present

## 2015-09-22 DIAGNOSIS — Y95 Nosocomial condition: Secondary | ICD-10-CM | POA: Diagnosis present

## 2015-09-22 DIAGNOSIS — L89324 Pressure ulcer of left buttock, stage 4: Secondary | ICD-10-CM | POA: Diagnosis present

## 2015-09-22 DIAGNOSIS — G8250 Quadriplegia, unspecified: Secondary | ICD-10-CM | POA: Diagnosis present

## 2015-09-22 DIAGNOSIS — E2740 Unspecified adrenocortical insufficiency: Secondary | ICD-10-CM | POA: Diagnosis present

## 2015-09-22 DIAGNOSIS — Z881 Allergy status to other antibiotic agents status: Secondary | ICD-10-CM

## 2015-09-22 DIAGNOSIS — K5900 Constipation, unspecified: Secondary | ICD-10-CM | POA: Diagnosis present

## 2015-09-22 DIAGNOSIS — J189 Pneumonia, unspecified organism: Secondary | ICD-10-CM | POA: Diagnosis not present

## 2015-09-22 DIAGNOSIS — N319 Neuromuscular dysfunction of bladder, unspecified: Secondary | ICD-10-CM | POA: Diagnosis present

## 2015-09-22 DIAGNOSIS — X58XXXS Exposure to other specified factors, sequela: Secondary | ICD-10-CM | POA: Diagnosis present

## 2015-09-22 DIAGNOSIS — Z681 Body mass index (BMI) 19 or less, adult: Secondary | ICD-10-CM

## 2015-09-22 DIAGNOSIS — R4182 Altered mental status, unspecified: Secondary | ICD-10-CM | POA: Diagnosis present

## 2015-09-22 DIAGNOSIS — J90 Pleural effusion, not elsewhere classified: Secondary | ICD-10-CM | POA: Diagnosis present

## 2015-09-22 DIAGNOSIS — E876 Hypokalemia: Secondary | ICD-10-CM | POA: Diagnosis present

## 2015-09-22 DIAGNOSIS — I9589 Other hypotension: Secondary | ICD-10-CM | POA: Diagnosis present

## 2015-09-22 DIAGNOSIS — R400 Somnolence: Secondary | ICD-10-CM | POA: Diagnosis not present

## 2015-09-22 DIAGNOSIS — I4901 Ventricular fibrillation: Secondary | ICD-10-CM | POA: Diagnosis not present

## 2015-09-22 DIAGNOSIS — Z87891 Personal history of nicotine dependence: Secondary | ICD-10-CM

## 2015-09-22 DIAGNOSIS — Z7951 Long term (current) use of inhaled steroids: Secondary | ICD-10-CM

## 2015-09-22 DIAGNOSIS — L89154 Pressure ulcer of sacral region, stage 4: Secondary | ICD-10-CM | POA: Diagnosis present

## 2015-09-22 DIAGNOSIS — G9341 Metabolic encephalopathy: Secondary | ICD-10-CM | POA: Diagnosis present

## 2015-09-22 DIAGNOSIS — Z880 Allergy status to penicillin: Secondary | ICD-10-CM

## 2015-09-22 DIAGNOSIS — S14109S Unspecified injury at unspecified level of cervical spinal cord, sequela: Secondary | ICD-10-CM | POA: Diagnosis not present

## 2015-09-22 DIAGNOSIS — N390 Urinary tract infection, site not specified: Secondary | ICD-10-CM | POA: Diagnosis present

## 2015-09-22 DIAGNOSIS — A419 Sepsis, unspecified organism: Secondary | ICD-10-CM | POA: Diagnosis present

## 2015-09-22 DIAGNOSIS — K219 Gastro-esophageal reflux disease without esophagitis: Secondary | ICD-10-CM | POA: Diagnosis present

## 2015-09-22 DIAGNOSIS — R6521 Severe sepsis with septic shock: Secondary | ICD-10-CM | POA: Diagnosis present

## 2015-09-22 DIAGNOSIS — J9601 Acute respiratory failure with hypoxia: Secondary | ICD-10-CM | POA: Diagnosis not present

## 2015-09-22 DIAGNOSIS — E43 Unspecified severe protein-calorie malnutrition: Secondary | ICD-10-CM | POA: Diagnosis present

## 2015-09-22 DIAGNOSIS — G8929 Other chronic pain: Secondary | ICD-10-CM | POA: Diagnosis present

## 2015-09-22 DIAGNOSIS — Z8249 Family history of ischemic heart disease and other diseases of the circulatory system: Secondary | ICD-10-CM

## 2015-09-22 DIAGNOSIS — R5383 Other fatigue: Secondary | ICD-10-CM

## 2015-09-22 DIAGNOSIS — Z79899 Other long term (current) drug therapy: Secondary | ICD-10-CM

## 2015-09-22 DIAGNOSIS — J45909 Unspecified asthma, uncomplicated: Secondary | ICD-10-CM | POA: Diagnosis present

## 2015-09-22 LAB — COMPREHENSIVE METABOLIC PANEL
ALBUMIN: 2.5 g/dL — AB (ref 3.5–5.0)
ALBUMIN: 2.5 g/dL — AB (ref 3.5–5.0)
ALK PHOS: 133 U/L — AB (ref 38–126)
ALT: 10 U/L — AB (ref 14–54)
ALT: 10 U/L — ABNORMAL LOW (ref 14–54)
ANION GAP: 10 (ref 5–15)
AST: 17 U/L (ref 15–41)
AST: 16 U/L (ref 15–41)
Alkaline Phosphatase: 140 U/L — ABNORMAL HIGH (ref 38–126)
Anion gap: 11 (ref 5–15)
BILIRUBIN TOTAL: 0.7 mg/dL (ref 0.3–1.2)
BUN: 28 mg/dL — ABNORMAL HIGH (ref 6–20)
BUN: 26 mg/dL — ABNORMAL HIGH (ref 6–20)
CALCIUM: 9.1 mg/dL (ref 8.9–10.3)
CHLORIDE: 103 mmol/L (ref 101–111)
CO2: 25 mmol/L (ref 22–32)
CO2: 24 mmol/L (ref 22–32)
CREATININE: 0.71 mg/dL (ref 0.44–1.00)
Calcium: 8.5 mg/dL — ABNORMAL LOW (ref 8.9–10.3)
Chloride: 98 mmol/L — ABNORMAL LOW (ref 101–111)
Creatinine, Ser: 0.65 mg/dL (ref 0.44–1.00)
GFR calc Af Amer: >60 mL/min (ref >60)
GFR calc non Af Amer: >60 mL/min (ref >60)
GFR calc non Af Amer: >60 mL/min (ref >60)
GLUCOSE: 104 mg/dL — AB (ref 65–99)
GLUCOSE: 87 mg/dL (ref 65–99)
POTASSIUM: 3.3 mmol/L — AB (ref 3.5–5.1)
Potassium: 3.7 mmol/L (ref 3.5–5.1)
SODIUM: 134 mmol/L — AB (ref 135–145)
SODIUM: 137 mmol/L (ref 135–145)
TOTAL PROTEIN: 6.5 g/dL (ref 6.5–8.1)
Total Bilirubin: 0.4 mg/dL (ref 0.3–1.2)
Total Protein: 6.4 g/dL — ABNORMAL LOW (ref 6.5–8.1)

## 2015-09-22 LAB — URINALYSIS, ROUTINE W REFLEX MICROSCOPIC
Bilirubin Urine: NEGATIVE
Glucose, UA: NEGATIVE mg/dL
Ketones, ur: NEGATIVE mg/dL
Nitrite: POSITIVE — AB
Protein, ur: NEGATIVE mg/dL
SPECIFIC GRAVITY, URINE: 1.007 (ref 1.005–1.030)
pH: 5.5 (ref 5.0–8.0)

## 2015-09-22 LAB — CBC WITH DIFFERENTIAL/PLATELET
BASOS ABS: 0 10*3/uL (ref 0.0–0.1)
Basophils Absolute: 0 10*3/uL (ref 0.0–0.1)
Basophils Relative: 0 %
Basophils Relative: 0 %
EOS ABS: 0 10*3/uL (ref 0.0–0.7)
Eosinophils Absolute: 0.1 10*3/uL (ref 0.0–0.7)
Eosinophils Relative: 0 %
Eosinophils Relative: 0 %
HCT: 42.5 % (ref 36.0–46.0)
HEMATOCRIT: 37.4 % (ref 36.0–46.0)
Hemoglobin: 12.2 g/dL (ref 12.0–15.0)
Hemoglobin: 13.4 g/dL (ref 12.0–15.0)
LYMPHS ABS: 2.6 10*3/uL (ref 0.7–4.0)
LYMPHS ABS: 2.1 10*3/uL (ref 0.7–4.0)
LYMPHS PCT: 14 %
Lymphocytes Relative: 12 %
MCH: 29 pg (ref 26.0–34.0)
MCH: 28.8 pg (ref 26.0–34.0)
MCHC: 32.6 g/dL (ref 30.0–36.0)
MCHC: 31.5 g/dL (ref 30.0–36.0)
MCV: 88.8 fL (ref 78.0–100.0)
MCV: 91.4 fL (ref 78.0–100.0)
MONO ABS: 1.2 10*3/uL — AB (ref 0.1–1.0)
MONO ABS: 0.9 10*3/uL (ref 0.1–1.0)
MONOS PCT: 7 %
Monocytes Relative: 5 %
NEUTROS ABS: 15 10*3/uL — AB (ref 1.7–7.7)
NEUTROS ABS: 14.9 10*3/uL — AB (ref 1.7–7.7)
Neutrophils Relative %: 79 %
Neutrophils Relative %: 83 %
PLATELETS: 397 10*3/uL (ref 150–400)
Platelets: 364 10*3/uL (ref 150–400)
RBC: 4.21 MIL/uL (ref 3.87–5.11)
RBC: 4.65 MIL/uL (ref 3.87–5.11)
RDW: 15.4 % (ref 11.5–15.5)
RDW: 15.4 % (ref 11.5–15.5)
WBC: 18.9 10*3/uL — ABNORMAL HIGH (ref 4.0–10.5)
WBC: 17.9 10*3/uL — AB (ref 4.0–10.5)

## 2015-09-22 LAB — URINE MICROSCOPIC-ADD ON

## 2015-09-22 LAB — PROTIME-INR
INR: 0.95
PROTHROMBIN TIME: 12.7 s (ref 11.4–15.2)

## 2015-09-22 LAB — I-STAT CG4 LACTIC ACID, ED
Lactic Acid, Venous: 1.68 mmol/L (ref 0.5–1.9)
Lactic Acid, Venous: 1.36 mmol/L (ref 0.5–1.9)

## 2015-09-22 LAB — LACTIC ACID, PLASMA
Lactic Acid, Venous: 1.2 mmol/L (ref 0.5–1.9)
Lactic Acid, Venous: 1.3 mmol/L (ref 0.5–1.9)

## 2015-09-22 LAB — APTT: aPTT: 21 seconds — ABNORMAL LOW (ref 24–36)

## 2015-09-22 LAB — PROCALCITONIN: PROCALCITONIN: 0.18 ng/mL

## 2015-09-22 LAB — I-STAT BETA HCG BLOOD, ED (MC, WL, AP ONLY): I-stat hCG, quantitative: 6 m[IU]/mL — ABNORMAL HIGH (ref <5)

## 2015-09-22 MED ORDER — SODIUM CHLORIDE 0.9 % IV BOLUS (SEPSIS)
30.0000 mL/kg | Freq: Once | INTRAVENOUS | Status: AC
  Administered 2015-09-22: 1266 mL via INTRAVENOUS

## 2015-09-22 MED ORDER — VANCOMYCIN HCL IN DEXTROSE 1-5 GM/200ML-% IV SOLN: 1000.0000 mg | INTRAVENOUS | Status: DC

## 2015-09-22 MED ORDER — AMIODARONE HCL IN DEXTROSE 360-4.14 MG/200ML-% IV SOLN: 30.0000 mg/h | INTRAVENOUS | Status: DC

## 2015-09-22 MED ORDER — SODIUM CHLORIDE 0.9 % IV SOLN
INTRAVENOUS | Status: DC
  Administered 2015-09-22 – 2015-09-27 (×5): via INTRAVENOUS

## 2015-09-22 MED ORDER — MIDODRINE HCL 5 MG PO TABS
5.0000 mg | ORAL_TABLET | Freq: Three times a day (TID) | ORAL | Status: DC
  Administered 2015-09-23 – 2015-09-25 (×6): 5 mg via ORAL
  Filled 2015-09-22 (×9): qty 1

## 2015-09-22 MED ORDER — ONDANSETRON HCL 4 MG PO TABS: 4.0000 mg | ORAL_TABLET | Freq: Four times a day (QID) | ORAL | Status: DC | PRN

## 2015-09-22 MED ORDER — AMIODARONE HCL IN DEXTROSE 360-4.14 MG/200ML-% IV SOLN: 60.0000 mg/h | INTRAVENOUS | Status: DC

## 2015-09-22 MED ORDER — ONDANSETRON HCL 4 MG/2ML IJ SOLN: 4.0000 mg | Freq: Four times a day (QID) | INTRAMUSCULAR | Status: DC | PRN

## 2015-09-22 MED ORDER — AZTREONAM 2 G IJ SOLR
2.0000 g | Freq: Once | INTRAMUSCULAR | Status: AC
  Administered 2015-09-22: 2 g via INTRAVENOUS
  Filled 2015-09-22: qty 2

## 2015-09-22 MED ORDER — NALOXONE HCL 0.4 MG/ML IJ SOLN
0.4000 mg | Freq: Once | INTRAMUSCULAR | Status: AC
  Administered 2015-09-22: 0.4 mg via INTRAVENOUS

## 2015-09-22 MED ORDER — BUDESONIDE 0.25 MG/2ML IN SUSP
2.0000 mL | Freq: Two times a day (BID) | RESPIRATORY_TRACT | Status: DC
  Administered 2015-09-22 – 2015-09-27 (×10): 0.25 mg via RESPIRATORY_TRACT
  Filled 2015-09-22 (×10): qty 2

## 2015-09-22 MED ORDER — PHENYLEPHRINE HCL 10 MG/ML IJ SOLN
0.0000 ug/min | INTRAVENOUS | Status: DC
  Administered 2015-09-22: 20 ug/min via INTRAVENOUS
  Administered 2015-09-23: 27 ug/min via INTRAVENOUS
  Administered 2015-09-24: 24 ug/min via INTRAVENOUS
  Administered 2015-09-24: 26 ug/min via INTRAVENOUS
  Filled 2015-09-22 (×6): qty 1

## 2015-09-22 MED ORDER — NALOXONE HCL 0.4 MG/ML IJ SOLN
0.2000 mg | Freq: Once | INTRAMUSCULAR | Status: DC
  Filled 2015-09-22: qty 1

## 2015-09-22 MED ORDER — ENOXAPARIN SODIUM 30 MG/0.3ML ~~LOC~~ SOLN
30.0000 mg | SUBCUTANEOUS | Status: DC
  Administered 2015-09-22 – 2015-09-24 (×3): 30 mg via SUBCUTANEOUS
  Filled 2015-09-22 (×3): qty 0.3

## 2015-09-22 MED ORDER — DEXTROSE 5 % IV SOLN
1.0000 g | Freq: Three times a day (TID) | INTRAVENOUS | Status: DC
  Administered 2015-09-22 – 2015-09-23 (×3): 1 g via INTRAVENOUS
  Filled 2015-09-22 (×4): qty 1

## 2015-09-22 MED ORDER — AMIODARONE LOAD VIA INFUSION
150.0000 mg | Freq: Once | INTRAVENOUS | Status: DC
  Filled 2015-09-22: qty 83.34

## 2015-09-22 MED ORDER — SODIUM CHLORIDE 0.9 % IV BOLUS (SEPSIS)
1000.0000 mL | Freq: Once | INTRAVENOUS | Status: AC
  Administered 2015-09-22: 1000 mL via INTRAVENOUS

## 2015-09-22 MED ORDER — SODIUM CHLORIDE 0.9 % IV SOLN: INTRAVENOUS | Status: DC | PRN

## 2015-09-22 MED ORDER — VANCOMYCIN HCL IN DEXTROSE 1-5 GM/200ML-% IV SOLN
1000.0000 mg | Freq: Once | INTRAVENOUS | Status: AC
  Administered 2015-09-22: 1000 mg via INTRAVENOUS
  Filled 2015-09-22: qty 200

## 2015-09-22 NOTE — ED Notes (Signed)
Per ICU Charge RN, pt to go to room @ 1815.

## 2015-09-22 NOTE — Progress Notes (Signed)
Pt arrived in 1225 at 1850.  Pt has three pressure ulcers.  Right and left buttocks and saccrum.  The one on the [facility] is a stage 4 and actively bleeding.  Changed dressings on all three using a wet to dry with abd on to cover.  Pt BP is hypotensive.  Informed Elink Dr Ashok Cordia.  Irven [place], RN

## 2015-09-22 NOTE — ED Triage Notes (Addendum)
Pt from home via EMS after family called for lethargy. Pt reports that she has not a BM x3 days with abd distention and low urine output. Pt also has shallow resp with low O2, RA 86%. Pt was placed on 4L, O2 increased to 97%. Pt took oxycodone PTA. Pt in NAD

## 2015-09-22 NOTE — Progress Notes (Signed)
RT tried to place arterial line x 2 with out success. CCM physician tried to place arterial line x 2without success. RT will continue to monitor.

## 2015-09-22 NOTE — Progress Notes (Deleted)
[facility] Progress Note Patient Name: Angela Page DOB: November 01, 1970 MRN: [id number]   Date of Service  09/22/2015  HPI/Events of Note  Patient deteriorated from V. tach to V. fib. CODE BLUE initiated. Patient underwent resuscitation by ACLS algorithm. Received IV bolus epinephrine, IV bolus bicarbonate, magnesium sulfate 2 g IV, & amiodarone 150 mg IV. Patient regained spontaneous circulation. Patient did undergo defibrillation for ventricular fibrillation.   eICU Interventions  1. Checking labs & ABG stat 2. Starting Amiodarone gtt 3. Intensivist to evaluate patient for possible therapeutic hypothermia     Intervention Category Major Interventions: Arrhythmia - evaluation and management;Code management / supervision  Tera Partridge 09/22/2015, 8:07 PM

## 2015-09-22 NOTE — Progress Notes (Signed)
Pharmacy Antibiotic Note  Angela Page is a 45 y.o. female with PMHx spinal cord injury with quadriplegia, neurogenic bladder with indwelling foley catheter, asthma, and stage 4 sacral ulcer, admitted on 09/22/2015 with lethargy and low 02 saturations.  Pharmacy has been consulted for Vancomycin and Aztreonam dosing for PNA.  Noted allergies to erythromycin (N/V), penicillins (hives), and macrobid (N/V).  Pt has tolerated cephalosporins in the past.  First doses ordered in ED.    Plan: Aztreonam 2g x 1 then 1g IV q8h Vancomycin 1g x1, then 1g IV q24h F/u renal function, VT at Css, cultures, clinical course  Height: 5\' 2"  (157.5 cm) Weight: 93 lb (42.2 kg) IBW/kg (Calculated) : 50.1  Temp (24hrs), Avg:99 F (37.2 C), Min:99 F (37.2 C), Max:99 F (37.2 C)  No results for input(s): WBC, CREATININE, LATICACIDVEN, VANCOTROUGH, VANCOPEAK, VANCORANDOM, GENTTROUGH, GENTPEAK, GENTRANDOM, TOBRATROUGH, TOBRAPEAK, TOBRARND, AMIKACINPEAK, AMIKACINTROU, AMIKACIN in the last 168 hours.  CrCl cannot be calculated (Patient's most recent lab result is older than the maximum 21 days allowed.).    Allergies  Allergen Reactions  . Erythromycin Nausea And Vomiting  . Nitrofurantoin Monohyd Macro Nausea And Vomiting  . Penicillins Hives    Has patient had a PCN reaction causing immediate rash, facial/tongue/throat swelling, SOB or lightheadedness with hypotension: Yes Has patient had a PCN reaction causing severe rash involving mucus membranes or skin necrosis: Yes Has patient had a PCN reaction that required hospitalization Yes- went to the DR Has patient had a PCN reaction occurring within the last 10 years: No-childhood allergy If all of the above answers are "NO", then may proceed with Cephalosporin use.     Antimicrobials this admission: 9/2 Vancomycin >>  9/2 Aztreonam >>   Dose adjustments this admission:  Microbiology results: 9/2 BCx: collected 9/2 UCx: ordered   Thank you for  allowing pharmacy to be a part of this patient's care.  Ralene Bathe, PharmD, BCPS 09/22/2015, 3:09 PM  Pager: [phone]

## 2015-09-22 NOTE — H&P (Signed)
History and Physical    Angela Page [id number] DOB: 02/16/1970 DOA: 09/22/2015  Referring MD/NP/PA: Dr. Ralene Bathe  PCP: PROVIDER NOT [facility]   Patient coming from: Home   Chief Complaint: Altered mental status   HPI: Angela Page is a 45 y.o. 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 Pt was unable to provide history and mother at bedside explained that pt has been more lethargic with poor oral intake. No specific symptoms reported like chest pain or dyspnea, no abd or urinary concerns.   ED Course: Pt initially unresponsive but responded to Narcan, initial blood pressure low 74/52 requiring 2 L IVF. Imaging studies notable for RLL and ? LLL PNA. Pt started on Vanc and Aztreonam and TRH asked to admit to SDU for further evaluation.   Review of Systems:  Unable to obtain detailed history due to fairly somnolent mental status.   Past Medical History:  Diagnosis Date  . Asthma   . GERD (gastroesophageal reflux disease)   . Pacemaker   . Pleurisy   . Quadriparesis [facility])     Past Surgical History:  Procedure Laterality Date  . APPENDECTOMY    . CARDIAC SURGERY    . I&D EXTREMITY  10/28/2011   Procedure: IRRIGATION AND DEBRIDEMENT EXTREMITY;  Surgeon: Tennis Must, MD;  Location: [place];  Service: Orthopedics;  Laterality: Right;  Irrigation and debridement right middle finger   Social history: reports that she has quit smoking. Her smoking use included Cigarettes. She has a 25.00 pack-year smoking history. She does not have any smokeless tobacco history on file. She reports that she drinks alcohol. She reports that she does not use drugs.  Allergies  Allergen Reactions  . Erythromycin Nausea And Vomiting  . Nitrofurantoin Monohyd Macro Nausea And Vomiting  . Penicillins Hives    Has patient had a PCN reaction causing immediate rash,  facial/tongue/throat swelling, SOB or lightheadedness with hypotension: Yes Has patient had a PCN reaction causing severe rash involving mucus membranes or skin necrosis: Yes Has patient had a PCN reaction that required hospitalization Yes- went to the DR Has patient had a PCN reaction occurring within the last 10 years: No-childhood allergy If all of the above answers are "NO", then may proceed with Cephalosporin use.     Family History  Problem Relation Age of Onset  . Hypertension Maternal Uncle     Medication Sig  acetaminophen (TYLENOL) 500 MG tablet Take 1,000 mg by mouth every 6 (six) hours as needed for moderate pain.  albuterol (PROVENTIL HFA;VENTOLIN HFA) 108 (90 BASE) MCG/ACT inhaler Inhale 2 puffs into the lungs every 6 (six) hours as needed. For shortness of breath   baclofen (LIORESAL) 20 MG tablet Take 20 mg by mouth 3 (three) times daily.  [employer] VITAMIN B12 1000 MCG tablet Take 1 tablet by mouth daily.  dextromethorphan-guaiFENesin (MUCINEX DM) 30-600 MG 12hr tablet Take 1 tablet by mouth 2 (two) times daily. Patient taking differently: Take 1 tablet by mouth 2 (two) times daily as needed for cough.   doxycycline (VIBRA-TABS) 100 MG tablet Take 100 mg by mouth every 12 (twelve) hours. continous  DULoxetine (CYMBALTA) 30 MG capsule Take 30 mg by mouth 2 (two) times daily.  ENSURE (ENSURE) Take 237 mLs by mouth 2 (two) times daily.  ferrous sulfate 325 (65 FE) MG tablet Take 1 tablet (325 mg total) by mouth 3 (three) times daily.  gabapentin (NEURONTIN) 400 MG capsule Take 400 mg by mouth 3 (three) times daily.  ibuprofen (ADVIL,MOTRIN) 200 MG tablet Take 600 mg by mouth every 6 (six) hours as needed for moderate pain.   lidocaine (LIDODERM) 5 % Place 1 patch onto the skin 2 (two) times daily as needed (pain).   midodrine (PROAMATINE) 5 MG tablet Take 5 mg by mouth 3 (three) times daily with meals.  oxycodone (OXY-IR) 5 MG capsule Take 5 mg by mouth every 4 (four) hours as  needed for pain.  QVAR 40 MCG/ACT inhaler Inhale 2 puffs into the lungs 2 (two) times daily.  silver sulfADIAZINE (SILVADENE) 1 % cream Apply 1 application topically every other day as needed (for wound care).  tiZANidine (ZANAFLEX) 4 MG tablet Take 4 mg by mouth every 8 (eight) hours as needed. Muscle spasms.  zinc oxide (BALMEX) 11.3 % CREA cream Apply 1 application topically 3 (three) times daily as needed (for skin redness/irritation).    Physical Exam: Vitals:   09/22/15 1715 09/22/15 1730 09/22/15 1745 09/22/15 1759  BP: (!) 82/57  (!) 83/63 (!) 74/52  Pulse: 80 80 80 79  Resp: 12 14 12 14   Temp:      TempSrc:      SpO2: 93% 92% 95% 90%  Weight:      Height:        Constitutional: NAD, somnolent but can be awaken by verbal stimuli  Vitals:   09/22/15 1715 09/22/15 1730 09/22/15 1745 09/22/15 1759  BP: (!) 82/57  (!) 83/63 (!) 74/52  Pulse: 80 80 80 79  Resp: 12 14 12 14   Temp:      TempSrc:      SpO2: 93% 92% 95% 90%  Weight:      Height:       Eyes: PERRL, lids and conjunctivae normal ENMT: Dry MM  Neck: normal, supple, no masses, no thyromegaly Respiratory: diminished breath sounds at bases with scattered rhonchi  Cardiovascular: Regular rate and rhythm, no murmurs / rubs / gallops. No extremity edema. 2+ pedal pulses.  Abdomen: no tenderness, no masses palpated. No hepatosplenomegaly. Bowel sounds positive.  Musculoskeletal: no clubbing / cyanosis.  Skin: no rashes, lesions, ulcers. No induration Neurologic: Somnolent, quadriplegic  Psychiatric: difficult to assess due to altered mental status   Labs on Admission: I have personally reviewed following labs and imaging studies  CBC:  Recent Labs Lab 09/22/15 1330  WBC 18.9*  NEUTROABS 15.0*  HGB 12.2  HCT 37.4  MCV 88.8  PLT 123456   Basic Metabolic Panel:  Recent Labs Lab 09/22/15 1330  NA 134*  K 3.7  CL 98*  CO2 25  GLUCOSE 104*  BUN 28*  CREATININE 0.71  CALCIUM 9.1   Liver Function  Tests:  Recent Labs Lab 09/22/15 1330  AST 17  ALT 10*  ALKPHOS 133*  BILITOT 0.4  PROT 6.4*  ALBUMIN 2.5*    Urine analysis:    Component Value Date/Time   COLORURINE YELLOW 02/27/2015 0930   APPEARANCEUR TURBID (A) 02/27/2015 0930   LABSPEC 1.014 02/27/2015 0930   PHURINE 5.5 02/27/2015 0930   GLUCOSEU NEGATIVE 02/27/2015 0930   HGBUR LARGE (A) 02/27/2015 0930   BILIRUBINUR NEGATIVE 02/27/2015 0930   KETONESUR NEGATIVE 02/27/2015 0930   PROTEINUR NEGATIVE 02/27/2015 0930   UROBILINOGEN 0.2 10/29/2014 1521   NITRITE NEGATIVE 02/27/2015 0930   LEUKOCYTESUR LARGE (A) 02/27/2015 0930   Radiological Exams on Admission: Dg Chest Port 1 View  Result Date: 09/22/2015 CLINICAL  DATA:  Atelectasis of RIGHT lower lobe with mild infiltrate versus atelectasis in medial LEFT lower lobe.   EKG: pending   Assessment/Plan Active Problems: Acute metabolic encephalopathy  - secondary to sepsis and PNA, also rather hypotensive and ? Effect of sedating meds that pt takes at home  - monitor closely in SDY - avoid sedating meds - keep on IVF  Sepsis secondary to RLL and LLL PNA - started on vanc and aztreonam  - follow up on blood and sputum culture  - keep in SDU as noted above, IVF - low threshold for initiating pressors   C-spine injury with quadriparesis   DVT prophylaxis: Lovenox SQ Code Status: Full  Family Communication: Pt updated at bedside Disposition Plan: Home in few days  Consults called: None Admission status: Inpatient   Faye Ramsay MD [facility] Pager [phone]  If 7PM-7AM, please contact night-coverage [email or web address] Password [facility]  09/22/2015, 6:21 PM

## 2015-09-22 NOTE — H&P (Signed)
PULMONARY / CRITICAL CARE MEDICINE   Name: Angela Page MRN: [id number] DOB: [birth year]    ADMISSION DATE:  09/22/2015 CONSULTATION DATE:  September 22, 2015  REFERRING MD:  Dr. Mart Piggs  CHIEF COMPLAINT:  Hypotension, AMS  HISTORY OF PRESENT ILLNESS:   See H&P from Dr. Doyle Askew from this evening, timed 6:21 for complete details.  PAST MEDICAL HISTORY :  She  has a past medical history of Asthma; GERD (gastroesophageal reflux disease); Pacemaker; Pleurisy; and Quadriparesis ([place]).  PAST SURGICAL HISTORY: She  has a past surgical history that includes Appendectomy; Cardiac surgery; and I&D extremity (10/28/2011).  Allergies  Allergen Reactions  . Erythromycin Nausea And Vomiting  . Nitrofurantoin Monohyd Macro Nausea And Vomiting  . Penicillins Hives    Has patient had a PCN reaction causing immediate rash, facial/tongue/throat swelling, SOB or lightheadedness with hypotension: Yes Has patient had a PCN reaction causing severe rash involving mucus membranes or skin necrosis: Yes Has patient had a PCN reaction that required hospitalization Yes- went to the DR Has patient had a PCN reaction occurring within the last 10 years: No-childhood allergy If all of the above answers are "NO", then may proceed with Cephalosporin use.     No current facility-administered medications on file prior to encounter.    Current Outpatient Prescriptions on File Prior to Encounter  Medication Sig  . albuterol (PROVENTIL HFA;VENTOLIN HFA) 108 (90 BASE) MCG/ACT inhaler Inhale 2 puffs into the lungs every 6 (six) hours as needed. For shortness of breath   . [employer] VITAMIN B12 1000 MCG tablet Take 1 tablet by mouth daily.  Marland Kitchen dextromethorphan-guaiFENesin (MUCINEX DM) 30-600 MG 12hr tablet Take 1 tablet by mouth 2 (two) times daily. (Patient taking differently: Take 1 tablet by mouth 2 (two) times daily as needed for cough. )  . doxycycline (VIBRA-TABS) 100 MG tablet Take 100 mg by mouth every 12 (twelve)  hours. continous  . DULoxetine (CYMBALTA) 30 MG capsule Take 30 mg by mouth 2 (two) times daily.  . ferrous sulfate 325 (65 FE) MG tablet Take 1 tablet (325 mg total) by mouth 3 (three) times daily.  Marland Kitchen gabapentin (NEURONTIN) 400 MG capsule Take 400 mg by mouth 3 (three) times daily.  Marland Kitchen ibuprofen (ADVIL,MOTRIN) 200 MG tablet Take 600 mg by mouth every 6 (six) hours as needed for moderate pain.   Marland Kitchen lidocaine (LIDODERM) 5 % Place 1 patch onto the skin 2 (two) times daily as needed (pain).   . midodrine (PROAMATINE) 5 MG tablet Take 5 mg by mouth 3 (three) times daily with meals.  Marland Kitchen oxycodone (OXY-IR) 5 MG capsule Take 5 mg by mouth every 4 (four) hours as needed for pain.  . silver sulfADIAZINE (SILVADENE) 1 % cream Apply 1 application topically every other day as needed (for wound care).  Marland Kitchen tiZANidine (ZANAFLEX) 4 MG tablet Take 4 mg by mouth every 8 (eight) hours as needed. Muscle spasms.  Marland Kitchen zinc oxide (BALMEX) 11.3 % CREA cream Apply 1 application topically 3 (three) times daily as needed (for skin redness/irritation).    FAMILY HISTORY:  Her indicated that the status of her maternal uncle is unknown.    SOCIAL HISTORY: She  reports that she has quit smoking. Her smoking use included Cigarettes. She has a 25.00 pack-year smoking history. She does not have any smokeless tobacco history on file. She reports that she drinks alcohol. She reports that she does not use drugs.  REVIEW OF SYSTEMS:   Per HPI  VITAL SIGNS: BP  97/72   Pulse 79   Temp 97.5 F (36.4 C) (Oral)   Resp 11   Ht 5\' 2"  (1.575 m)   Wt 93 lb (42.2 kg)   LMP  (LMP Unknown)   SpO2 100%   BMI 17.01 kg/m   HEMODYNAMICS: On peripheral Neo    VENTILATOR SETTINGS:    INTAKE / OUTPUT: I/O last 3 completed shifts: In: -  Out: 50 [Urine:50]  PHYSICAL EXAMINATION: Eyes: PERRL, lids and conjunctivae normal ENMT: Dry MM  Neck: normal, supple, no masses Respiratory: diminished breath sounds at bases with scattered  rhonchi  Cardiovascular: Regular rate and rhythm, no murmurs / rubs / gallops. No extremity edema. 2+ pedal pulses.  Abdomen: no tenderness, no masses palpated. No hepatosplenomegaly. Bowel sounds positive.  Musculoskeletal: no clubbing / cyanosis.  Skin: no rashes, lesions, ulcers. No induration Neurologic: Awake and alert, but quadriplegic   LABS:  BMET  Recent Labs Lab 09/22/15 1330 09/22/15 2018  NA 134* 137  K 3.7 3.3*  CL 98* 103  CO2 25 24  BUN 28* 26*  CREATININE 0.71 0.65  GLUCOSE 104* 87    Electrolytes  Recent Labs Lab 09/22/15 1330 09/22/15 2018  CALCIUM 9.1 8.5*    CBC  Recent Labs Lab 09/22/15 1330 09/22/15 2018  WBC 18.9* 17.9*  HGB 12.2 13.4  HCT 37.4 42.5  PLT 364 397    Coag's  Recent Labs Lab 09/22/15 2018  APTT 21*  INR 0.95    Sepsis Markers  Recent Labs Lab 09/22/15 1828 09/22/15 2018 09/22/15 2238  LATICACIDVEN 1.36 1.2 1.3  PROCALCITON  --  0.18  --     ABG No results for input(s): PHART, PCO2ART, PO2ART in the last 168 hours.  Liver Enzymes  Recent Labs Lab 09/22/15 1330 09/22/15 2018  AST 17 16  ALT 10* 10*  ALKPHOS 133* 140*  BILITOT 0.4 0.7  ALBUMIN 2.5* 2.5*    Cardiac Enzymes No results for input(s): TROPONINI, PROBNP in the last 168 hours.  Glucose No results for input(s): GLUCAP in the last 168 hours.  Imaging Dg Chest Port 1 View  Result Date: 09/22/2015 CLINICAL DATA:  Lethargy, no bowel movement for 3 days, abdominal distension, low urine output, shallow respirations, asthma, former smoker EXAM: PORTABLE CHEST 1 VIEW COMPARISON:  Portable exam 1340 hours compared to 05/07/2015 FINDINGS: LEFT subclavian transvenous pacemaker leads project over RIGHT atrium and RIGHT ventricle, unchanged. Normal heart size, mediastinal contours and pulmonary vascularity. RIGHT lower lobe atelectasis. Mild subsegmental atelectasis versus infiltrate at medial LEFT lower lobe. Upper lungs clear. No pleural effusion  or pneumothorax. IMPRESSION: Atelectasis of RIGHT lower lobe with mild infiltrate versus atelectasis in medial LEFT lower lobe. Electronically Signed   By: Lavonia Dana M.D.   On: 09/22/2015 14:17   STUDIES:  None other than above  CULTURES: Blood 9/2 >> pending Urine 9/2 >> pending  ANTIBIOTICS: Aztrenoam >> Vanc 9/2 >>  SIGNIFICANT EVENTS: Failed A-line attempt (R radial)  LINES/TUBES: PIV x2 Foley 9/3>>  DISCUSSION: 45 y/o woman w/ quadriplegia presents with AMS, ?sepsis  ASSESSMENT / PLAN:  PULMONARY A: Possible PNA B pleural effusions P:   Judicious fluid management Tx w/ aztreonam/Vanc  CARDIOVASCULAR A:  Hypotension P:  Baseline BP 80/60s, Goal MAP >55 Possible component of sepsis, autonomic dsyfunction  RENAL A:   No active issues P:    GASTROINTESTINAL A:   No active issues P:    HEMATOLOGIC A:   Leukocytolysis P:  Likely due to  sepsis  INFECTIOUS A: Sepsis due to probable PNA P:   Treat as above  ENDOCRINE A:   No active issues P:    NEUROLOGIC A:   Quadriplegia Probable autonomic dysfunction AMS P:   MS greatly improved w/ tx. Goal BP >55 RASS goal: 0  FAMILY  - Updates:   - Inter-disciplinary family meet or Palliative Care meeting due by:  09/29/15  CRITICAL CARE Performed by: Luz Brazen   Total critical care time: 40 minutes  Critical care time was exclusive of separately billable procedures and treating other patients.  Critical care was necessary to treat or prevent imminent or life-threatening deterioration.  Critical care was time spent personally by me on the following activities: development of treatment plan with patient and/or surrogate as well as nursing, discussions with consultants, evaluation of patient's response to treatment, examination of patient, obtaining history from patient or surrogate, ordering and performing treatments and interventions, ordering and review of laboratory studies, ordering  and review of radiographic studies, pulse oximetry and re-evaluation of patient's condition.  Luz Brazen, MD [facility] Pager: [phone]  09/22/2015, 11:52 PM

## 2015-09-22 NOTE — ED Notes (Signed)
Pt mother wants to be called with status or changes Mother: [name] [phone]

## 2015-09-22 NOTE — Progress Notes (Signed)
RT originally ordered to collect ABG on PT. RT drew from RR- sample resulted as VBG. RT (at 1411) discussed with Dr. Ralene Bathe- Dr. Ralene Bathe is aware and accepts values as venous sample. RT cancels ABG order and orders as VBG with original blood draw sample results. Dr. Ralene Bathe is aware of below reportable range result for ctHb.

## 2015-09-22 NOTE — Progress Notes (Signed)
[facility] Progress Note Patient Name: Angela Page DOB: December 20, 1970 MRN: [id number]   Date of Service  09/22/2015  HPI/Events of Note  Notified by bedside nurse of hypotension. Camera check shows patient resting comfortably with eyes open in the room. She is able to tell us she is in [facility]. Not tachycardic. BP similar in both arms & confirmed with manual BP cuff.  Previous Systolic BP has ranged from 90s to 130s. Was in the 59s on previous admission in February. Question autonomic dysreflexia given paralysis.   eICU Interventions  1. NS 1L bolus now 2. Neo-Synephrine via PIV to maintain MAP >65 & SBP >90 3. Placing arterial line to confirm BP     Intervention Category Major Interventions: Hypotension - evaluation and management  Tera Partridge 09/22/2015, 7:18 PM

## 2015-09-22 NOTE — ED Provider Notes (Signed)
[facility] DEPT Provider Note   CSN: [id number] Arrival date & time: 09/22/15  1253     History   Chief Complaint Chief Complaint  Patient presents with  . Fatigue  . Altered Mental Status    HPI Angela Page is a 45 y.o. female.  The history is provided by the EMS personnel, a caregiver and a parent. No language interpreter was used.   Angela Page is a 45 y.o. female who presents to the Emergency Department complaining of AMS.  History is provided by EMS and the patient's mother. 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 Mother reports that she's been increasingly lethargic over the last several days with decreased oral intake and decreased urine output. She's had a temperature of 100.6 at home, no vomiting. Level 5 caveat due to unresponsiveness.  Past Medical History:  Diagnosis Date  . Asthma   . GERD (gastroesophageal reflux disease)   . Pacemaker   . Pleurisy   . Quadriparesis [facility])     Patient Active Problem List   Diagnosis Date Noted  . Pressure ulcer 02/26/2015  . HCAP (healthcare-associated pneumonia) 02/25/2015  . UTI (lower urinary tract infection) 02/25/2015  . Hypotension 02/25/2015  . Anemia 02/25/2015  . Enterococcus UTI 02/03/2015  . Sepsis ([place]) 01/31/2015  . RLL pneumonia 01/31/2015  . Altered mental status 10/29/2014  . Encephalopathy, toxic 10/29/2014  . Toxic metabolic encephalopathy Q000111Q  . Urinary tract infection due to Proteus 10/24/2014  . Leukocytosis 10/23/2014  . Hypokalemia 10/23/2014  . Pressure ulcer of sacral region, stage 4 ([place]) 10/23/2014  . Anemia, iron deficiency 10/23/2014  . Severe sepsis ([place])   . Septic shock ([place]) 10/22/2014  . Quadriplegia, C5-C7 incomplete ([place]) 10/22/2014  . Depression   . Protein-calorie malnutrition, severe ([place]) 09/05/2014  . Overdose of opiate or related narcotic 09/02/2014    Past Surgical History:  Procedure Laterality Date  . APPENDECTOMY    . CARDIAC  SURGERY    . I&D EXTREMITY  10/28/2011   Procedure: IRRIGATION AND DEBRIDEMENT EXTREMITY;  Surgeon: Tennis Must, MD;  Location: [place];  Service: Orthopedics;  Laterality: Right;  Irrigation and debridement right middle finger    OB History    No data available       Home Medications    Prior to Admission medications   Medication Sig Start Date End Date Taking? Authorizing Provider  acetaminophen (TYLENOL) 500 MG tablet Take 1,000 mg by mouth every 6 (six) hours as needed for moderate pain.   Yes Historical Provider, MD  albuterol (PROVENTIL HFA;VENTOLIN HFA) 108 (90 BASE) MCG/ACT inhaler Inhale 2 puffs into the lungs every 6 (six) hours as needed. For shortness of breath    Yes Historical Provider, MD  baclofen (LIORESAL) 20 MG tablet Take 20 mg by mouth 3 (three) times daily.   Yes Historical Provider, MD  [employer] VITAMIN B12 1000 MCG tablet Take 1 tablet by mouth daily. 08/22/14  Yes Historical Provider, MD  dextromethorphan-guaiFENesin (MUCINEX DM) 30-600 MG 12hr tablet Take 1 tablet by mouth 2 (two) times daily. Patient taking differently: Take 1 tablet by mouth 2 (two) times daily as needed for cough.  03/01/15  Yes Debbe Odea, MD  doxycycline (VIBRA-TABS) 100 MG tablet Take 100 mg by mouth every 12 (twelve) hours. continous 10/12/14  Yes Historical Provider, MD  DULoxetine (CYMBALTA) 30 MG capsule Take 30 mg by mouth 2 (two) times daily. 01/06/15  Yes Historical Provider, MD  ENSURE (ENSURE)  Take 237 mLs by mouth 2 (two) times daily.   Yes Historical Provider, MD  ferrous sulfate 325 (65 FE) MG tablet Take 1 tablet (325 mg total) by mouth 3 (three) times daily. 08/04/14  Yes Janece Canterbury, MD  gabapentin (NEURONTIN) 400 MG capsule Take 400 mg by mouth 3 (three) times daily. 10/18/14  Yes Historical Provider, MD  ibuprofen (ADVIL,MOTRIN) 200 MG tablet Take 600 mg by mouth every 6 (six) hours as needed for moderate pain.    Yes Historical Provider, MD  lidocaine  (LIDODERM) 5 % Place 1 patch onto the skin 2 (two) times daily as needed (pain).  04/04/15  Yes Historical Provider, MD  midodrine (PROAMATINE) 5 MG tablet Take 5 mg by mouth 3 (three) times daily with meals.   Yes Historical Provider, MD  oxycodone (OXY-IR) 5 MG capsule Take 5 mg by mouth every 4 (four) hours as needed for pain.   Yes Historical Provider, MD  QVAR 40 MCG/ACT inhaler Inhale 2 puffs into the lungs 2 (two) times daily. 09/07/15  Yes Historical Provider, MD  silver sulfADIAZINE (SILVADENE) 1 % cream Apply 1 application topically every other day as needed (for wound care).   Yes Historical Provider, MD  tiZANidine (ZANAFLEX) 4 MG tablet Take 4 mg by mouth every 8 (eight) hours as needed. Muscle spasms. 12/13/14  Yes Historical Provider, MD  zinc oxide (BALMEX) 11.3 % CREA cream Apply 1 application topically 3 (three) times daily as needed (for skin redness/irritation).   Yes Historical Provider, MD    Family History Family History  Problem Relation Age of Onset  . Hypertension Maternal Uncle     Social History Social History  Substance Use Topics  . Smoking status: Former Smoker    Packs/day: 1.00    Years: 25.00    Types: Cigarettes  . Smokeless tobacco: Not on file  . Alcohol use Yes     Comment: 2 x weekly     Allergies   Erythromycin; Nitrofurantoin monohyd macro; and Penicillins   Review of Systems Review of Systems  Unable to perform ROS: Patient unresponsive     Physical Exam Updated Vital Signs BP (!) 74/52 (BP Location: Left Arm)   Pulse 79   Temp 99 F (37.2 C) (Rectal)   Resp 14   Ht 5\' 2"  (1.575 m)   Wt 93 lb (42.2 kg)   LMP  (LMP Unknown)   SpO2 90%   BMI 17.01 kg/m   Physical Exam  Constitutional: She appears well-developed.  Thin, chronically ill-appearing  HENT:  Head: Normocephalic and atraumatic.  Dry mucous membranes  Cardiovascular: Normal rate and regular rhythm.   No murmur heard. Pulmonary/Chest:  Shallow respirations,  rhonchi bilaterally  Abdominal: Soft. She exhibits distension. There is no tenderness. There is no rebound and no guarding.  Musculoskeletal: She exhibits no edema or tenderness.  Contractures of the hands, legs  Neurological:  GCS 1-1-1  Skin: Skin is warm and dry. Capillary refill takes less than 2 seconds. There is pallor.  Psychiatric:  Unable to assess  Nursing note and vitals reviewed.    ED Treatments / Results  Labs (all labs ordered are listed, but only abnormal results are displayed) Labs Reviewed  COMPREHENSIVE METABOLIC PANEL - Abnormal; Notable for the following:       Result Value   Sodium 134 (*)    Chloride 98 (*)    Glucose, Bld 104 (*)    BUN 28 (*)    Total Protein 6.4 (*)  Albumin 2.5 (*)    ALT 10 (*)    Alkaline Phosphatase 133 (*)    All other components within normal limits  CBC WITH DIFFERENTIAL/PLATELET - Abnormal; Notable for the following:    WBC 18.9 (*)    Neutro Abs 15.0 (*)    Monocytes Absolute 1.2 (*)    All other components within normal limits  BLOOD GAS, VENOUS - Abnormal; Notable for the following:    pH, Ven 7.201 (*)    pCO2, Ven 21.3 (*)    Bicarbonate 8.0 (*)    All other components within normal limits  I-STAT BETA HCG BLOOD, ED ([facility], [facility], [facility] ONLY) - Abnormal; Notable for the following:    I-stat hCG, quantitative 6.0 (*)    All other components within normal limits  CULTURE, BLOOD (ROUTINE X 2)  CULTURE, BLOOD (ROUTINE X 2)  URINE CULTURE  URINALYSIS, ROUTINE W REFLEX MICROSCOPIC (NOT AT [facility])  I-STAT CG4 LACTIC ACID, ED  I-STAT CG4 LACTIC ACID, ED    EKG  EKG Interpretation  Date/Time:  Saturday September 22 2015 13:09:47 EDT Ventricular Rate:  80 PR Interval:    QRS Duration: 96 QT Interval:  397 QTC Calculation: 458 R Axis:   50 Text Interpretation:  Sinus rhythm Nonspecific T abnormalities, lateral leads Baseline wander in lead(s) I III aVL V5 Confirmed by Hazle Coca [phone]) on 09/22/2015 2:14:04 PM Also confirmed  by Hazle Coca [phone]), editor [place] CT, Leda Gauze [phone])  on 09/22/2015 2:45:01 PM       Radiology Dg Chest Port 1 View  Result Date: 09/22/2015 CLINICAL DATA:  Lethargy, no bowel movement for 3 days, abdominal distension, low urine output, shallow respirations, asthma, former smoker EXAM: PORTABLE CHEST 1 VIEW COMPARISON:  Portable exam 1340 hours compared to 05/07/2015 FINDINGS: LEFT subclavian transvenous pacemaker leads project over RIGHT atrium and RIGHT ventricle, unchanged. Normal heart size, mediastinal contours and pulmonary vascularity. RIGHT lower lobe atelectasis. Mild subsegmental atelectasis versus infiltrate at medial LEFT lower lobe. Upper lungs clear. No pleural effusion or pneumothorax. IMPRESSION: Atelectasis of RIGHT lower lobe with mild infiltrate versus atelectasis in medial LEFT lower lobe. Electronically Signed   By: Lavonia Dana M.D.   On: 09/22/2015 14:17    Procedures Procedures (including critical care time)  Medications Ordered in ED Medications  aztreonam (AZACTAM) 1 g in dextrose 5 % 50 mL IVPB (not administered)  vancomycin (VANCOCIN) IVPB 1000 mg/200 mL premix (not administered)  sodium chloride 0.9 % bolus 1,266 mL (0 mL/kg  42.2 kg Intravenous Stopped 09/22/15 1539)  naloxone (NARCAN) injection 0.4 mg (0.4 mg Intravenous Given 09/22/15 1350)  aztreonam (AZACTAM) 2 g in dextrose 5 % 50 mL IVPB (0 g Intravenous Stopped 09/22/15 1539)  vancomycin (VANCOCIN) IVPB 1000 mg/200 mL premix (0 mg Intravenous Stopped 09/22/15 1800)     Initial Impression / Assessment and Plan / ED Course  I have reviewed the triage vital signs and the nursing notes.  Pertinent labs & imaging results that were available during my care of the patient were reviewed by me and considered in my medical decision making (see chart for details).  Clinical Course    Patient here for evaluation of altered mental status, unresponsive on ED arrival. Following Narcan administration she has improvement  in her mental status and is able to communicate. She reports feeling lethargic over the last several days with decreased appetite and abdominal bloating.  Given her ill appearance on ED arrival she was started on the sepsis protocol and  given 30 mL/kg fluid bolus. Her mental status significantly improved following Narcan administration. Given her lung exam, hypoxia she was started on antibiotics for potential pneumonia. Hospitalist consulted for admission for further treatment. Patient updated of findings of studies recommendation for admission and she is in agreement with plan. Final Clinical Impressions(s) / ED Diagnoses   Final diagnoses:  Lethargy    New Prescriptions New Prescriptions   No medications on file     Quintella Reichert, MD 09/22/15 1814

## 2015-09-22 NOTE — ED Notes (Signed)
Gave report to Shanon Brow, Therapist, sports. Was Ok'd for phone report by ICU Charge RN

## 2015-09-23 DIAGNOSIS — A419 Sepsis, unspecified organism: Principal | ICD-10-CM

## 2015-09-23 LAB — BASIC METABOLIC PANEL
Anion gap: 6 (ref 5–15)
Anion gap: 6 (ref 5–15)
BUN: 21 mg/dL — AB (ref 6–20)
BUN: 20 mg/dL (ref 6–20)
CHLORIDE: 109 mmol/L (ref 101–111)
CHLORIDE: 109 mmol/L (ref 101–111)
CO2: 24 mmol/L (ref 22–32)
CO2: 24 mmol/L (ref 22–32)
CREATININE: 0.4 mg/dL — AB (ref 0.44–1.00)
Calcium: 8.5 mg/dL — ABNORMAL LOW (ref 8.9–10.3)
Calcium: 8.4 mg/dL — ABNORMAL LOW (ref 8.9–10.3)
Creatinine, Ser: 0.5 mg/dL (ref 0.44–1.00)
GFR calc Af Amer: >60 mL/min (ref >60)
GFR calc non Af Amer: >60 mL/min (ref >60)
Glucose, Bld: 80 mg/dL (ref 65–99)
Glucose, Bld: 82 mg/dL (ref 65–99)
POTASSIUM: 3.4 mmol/L — AB (ref 3.5–5.1)
POTASSIUM: 3 mmol/L — AB (ref 3.5–5.1)
SODIUM: 139 mmol/L (ref 135–145)
SODIUM: 139 mmol/L (ref 135–145)

## 2015-09-23 LAB — BLOOD CULTURE ID PANEL (REFLEXED)
ACINETOBACTER BAUMANNII: NOT DETECTED
CANDIDA KRUSEI: NOT DETECTED
CANDIDA PARAPSILOSIS: NOT DETECTED
Candida albicans: NOT DETECTED
Candida glabrata: NOT DETECTED
Candida tropicalis: NOT DETECTED
ESCHERICHIA COLI: NOT DETECTED
Enterobacter cloacae complex: NOT DETECTED
Enterobacteriaceae species: NOT DETECTED
Enterococcus species: NOT DETECTED
Haemophilus influenzae: NOT DETECTED
KLEBSIELLA OXYTOCA: NOT DETECTED
Klebsiella pneumoniae: NOT DETECTED
Listeria monocytogenes: NOT DETECTED
Methicillin resistance: DETECTED — AB
Neisseria meningitidis: NOT DETECTED
PSEUDOMONAS AERUGINOSA: NOT DETECTED
Proteus species: NOT DETECTED
SERRATIA MARCESCENS: NOT DETECTED
STAPHYLOCOCCUS AUREUS BCID: NOT DETECTED
STREPTOCOCCUS PNEUMONIAE: NOT DETECTED
STREPTOCOCCUS PYOGENES: NOT DETECTED
Staphylococcus species: DETECTED — AB
Streptococcus agalactiae: NOT DETECTED
Streptococcus species: NOT DETECTED

## 2015-09-23 LAB — CBC
HEMATOCRIT: 37.8 % (ref 36.0–46.0)
Hemoglobin: 12.2 g/dL (ref 12.0–15.0)
MCH: 29 pg (ref 26.0–34.0)
MCHC: 32.3 g/dL (ref 30.0–36.0)
MCV: 90 fL (ref 78.0–100.0)
Platelets: 383 10*3/uL (ref 150–400)
RBC: 4.2 MIL/uL (ref 3.87–5.11)
RDW: 15.3 % (ref 11.5–15.5)
WBC: 18.4 10*3/uL — AB (ref 4.0–10.5)

## 2015-09-23 LAB — MRSA PCR SCREENING: MRSA BY PCR: NEGATIVE

## 2015-09-23 LAB — CORTISOL: CORTISOL PLASMA: 21.9 ug/dL

## 2015-09-23 MED ORDER — ALBUTEROL SULFATE (2.5 MG/3ML) 0.083% IN NEBU
2.5000 mg | INHALATION_SOLUTION | RESPIRATORY_TRACT | Status: DC | PRN
Start: 2015-09-23 — End: 2015-09-27
  Administered 2015-09-23 (×2): 2.5 mg via RESPIRATORY_TRACT
  Filled 2015-09-23 (×2): qty 3

## 2015-09-23 MED ORDER — ORAL CARE MOUTH RINSE
15.0000 mL | Freq: Two times a day (BID) | OROMUCOSAL | Status: DC
  Administered 2015-09-23 – 2015-09-27 (×9): 15 mL via OROMUCOSAL

## 2015-09-23 MED ORDER — DULOXETINE HCL 30 MG PO CPEP
30.0000 mg | ORAL_CAPSULE | Freq: Two times a day (BID) | ORAL | Status: DC
  Administered 2015-09-23 – 2015-09-27 (×9): 30 mg via ORAL
  Filled 2015-09-23 (×9): qty 1

## 2015-09-23 MED ORDER — ENSURE ENLIVE PO LIQD
237.0000 mL | Freq: Two times a day (BID) | ORAL | Status: DC
  Administered 2015-09-23 – 2015-09-27 (×8): 237 mL via ORAL

## 2015-09-23 MED ORDER — POTASSIUM CHLORIDE CRYS ER 20 MEQ PO TBCR
20.0000 meq | EXTENDED_RELEASE_TABLET | ORAL | Status: AC
  Administered 2015-09-23 (×2): 20 meq via ORAL
  Filled 2015-09-23 (×2): qty 1

## 2015-09-23 MED ORDER — LIDOCAINE 5 % EX PTCH
1.0000 | MEDICATED_PATCH | Freq: Every day | CUTANEOUS | Status: DC | PRN
  Filled 2015-09-23: qty 1

## 2015-09-23 MED ORDER — BACLOFEN 10 MG PO TABS
20.0000 mg | ORAL_TABLET | Freq: Three times a day (TID) | ORAL | Status: DC
  Administered 2015-09-23 – 2015-09-27 (×12): 20 mg via ORAL
  Filled 2015-09-23 (×12): qty 2

## 2015-09-23 MED ORDER — DEXTROSE 5 % IV SOLN
1.0000 g | Freq: Three times a day (TID) | INTRAVENOUS | Status: DC
  Administered 2015-09-23 – 2015-09-27 (×11): 1 g via INTRAVENOUS
  Filled 2015-09-23 (×13): qty 1

## 2015-09-23 MED ORDER — TIZANIDINE HCL 4 MG PO TABS
4.0000 mg | ORAL_TABLET | Freq: Three times a day (TID) | ORAL | Status: DC | PRN
  Administered 2015-09-23 – 2015-09-27 (×2): 4 mg via ORAL
  Filled 2015-09-23 (×4): qty 1

## 2015-09-23 MED ORDER — GABAPENTIN 400 MG PO CAPS
400.0000 mg | ORAL_CAPSULE | Freq: Three times a day (TID) | ORAL | Status: DC
  Administered 2015-09-23 – 2015-09-27 (×13): 400 mg via ORAL
  Filled 2015-09-23 (×13): qty 1

## 2015-09-23 MED ORDER — OXYCODONE HCL 5 MG PO TABS
5.0000 mg | ORAL_TABLET | ORAL | Status: DC | PRN
  Administered 2015-09-23 – 2015-09-27 (×15): 5 mg via ORAL
  Filled 2015-09-23 (×15): qty 1

## 2015-09-23 MED ORDER — ADULT MULTIVITAMIN W/MINERALS CH
1.0000 | ORAL_TABLET | Freq: Every day | ORAL | Status: DC
  Administered 2015-09-23 – 2015-09-27 (×5): 1 via ORAL
  Filled 2015-09-23 (×5): qty 1

## 2015-09-23 MED ORDER — SODIUM CHLORIDE 0.9 % IV BOLUS (SEPSIS)
1000.0000 mL | Freq: Once | INTRAVENOUS | Status: AC
  Administered 2015-09-23: 1000 mL via INTRAVENOUS

## 2015-09-23 NOTE — Progress Notes (Signed)
[place] Progress Note Patient Name: Angela Page DOB: August 17, 1970 MRN: [id number]   Date of Service  09/23/2015  HPI/Events of Note  Follow-up on the patient.  Patient was seen, comfortable, not in distress. Sleeping.  Blood pressure 102/63, map 75, heart rate 84, respiratory rate 14, sats 97% on nasal cannula.   No subjective complaints per RN.  Blood pressure usually runs 80/60 baseline.   Currently on Neo-Synephrine at 23 mcg/kg/m.  Getting maintenance fluids.   eICU Interventions  Wean off Neo-Synephrine. Baseline blood pressure Q000111Q systolic.  Continue IV fluids. Continue antibiotics.  Lactic acid less than 2.         [place] 09/23/2015, 2:33 AM

## 2015-09-23 NOTE — Progress Notes (Signed)
Patient ID: Angela Page, female   DOB: 01/25/1970, 45 y.o.   MRN: [id number]    PROGRESS NOTE    Angela Page  [id number] DOB: [birth year] DOA: 09/22/2015  PCP: PROVIDER NOT IN SYSTEM   Brief Narrative:  45 y.o. 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 Pt was unable to provide history and mother at bedside explained that pt has been more lethargic with poor oral intake. No specific symptoms reported like chest pain or dyspnea, no abd or urinary concerns.   ED Course: Pt initially unresponsive but responded to Narcan, initial blood pressure low 74/52 requiring 2 L IVF. Imaging studies notable for RLL and ? LLL PNA. Pt started on Vanc and Aztreonam and TRH asked to admit to SDU for further evaluation. Upon arrival to the SDU, pt had episode of v-tach, v-fib, code blue initiated, due to hypotension, placed on pressors and PCCM consulted for assistance.   Assessment & Plan: Acute hypoxic respiratory failure from PNA - respiratory status stable this AM - keep on oxygen via [place] to maintain O2 sat > 92% - use pulmicort and albuterol   Chronic hypotension >> normal SBP 80's to 90's. - Continue IV fluids, midodrine - pressors per PCCM   Sepsis secondary to UTI, RLL and LLL PNA, unknown pathogen - continue vanc and aztreonam day #2 - follow up on blood and urine, sputum cultures  - UA suggestive of UTI but could be chronic colonization  Neurogenic bladder with chronic foley  Hypokalemia. - supplement and repeat BMP in AM  Severe protein calorie malnutrition. - advance diet   Stage IV sacral wound >> present prior to admission. - wound care consult   C spine injury with quadriplegia - continue Baclofen, duloxetine, gabapentin - ok to use Prn oxycodone, tizanidine  Underweight  - Body mass index is 17.01 kg/m.  DVT prophylaxis: Lovenox Sq Code Status: Full  Family  Communication: Patient at bedside  Disposition Plan: Keep in SDU   Consultants:   PCCM  Procedures:   Code blue 9/2 - required defibrillation   Antimicrobials:   Aztreonam 9/2 -->  Vancomycin 9/2 -->   Subjective: Still on pressors. No concerns this AM.   Objective: Vitals:   09/23/15 1946 09/23/15 1952 09/23/15 1954 09/23/15 2000  BP:    (!) 84/51  Pulse:      Resp:    11  Temp: 97.6 F (36.4 C)     TempSrc: Oral     SpO2:  100% 100% 100%  Weight:      Height:        Intake/Output Summary (Last 24 hours) at 09/23/15 2111 Last data filed at 09/23/15 2000  Gross per 24 hour  Intake          3150.97 ml  Output             1510 ml  Net          1640.97 ml   Filed Weights   09/22/15 1334  Weight: 42.2 kg (93 lb)    Examination:  General exam: Appears calm and comfortable  Respiratory system: Clear to auscultation. Respiratory effort normal. Cardiovascular system: S1 & S2 heard, RRR. No JVD, murmurs, rubs, gallops or clicks. No pedal edema. Gastrointestinal system: Abdomen is nondistended, soft and nontender. No organomegaly or masses felt.   Data Reviewed: I have personally reviewed following labs and imaging studies  CBC:  Recent Labs  Lab 09/22/15 1330 09/22/15 2018 09/23/15 0407  WBC 18.9* 17.9* 18.4*  NEUTROABS 15.0* 14.9*  --   HGB 12.2 13.4 12.2  HCT 37.4 42.5 37.8  MCV 88.8 91.4 90.0  PLT 364 397 [id number]   Basic Metabolic Panel:  Recent Labs Lab 09/22/15 1330 09/22/15 2018 09/23/15 0407 09/23/15 0627  NA 134* 137 139 139  K 3.7 3.3* 3.4* 3.0*  CL 98* 103 109 109  CO2 25 24 24 24   GLUCOSE 104* 87 80 82  BUN 28* 26* 21* 20  CREATININE 0.71 0.65 0.50 0.40*  CALCIUM 9.1 8.5* 8.5* 8.4*   Liver Function Tests:  Recent Labs Lab 09/22/15 1330 09/22/15 2018  AST 17 16  ALT 10* 10*  ALKPHOS 133* 140*  BILITOT 0.4 0.7  PROT 6.4* 6.5  ALBUMIN 2.5* 2.5*   Coagulation Profile:  Recent Labs Lab 09/22/15 2018  INR 0.95   Urine  analysis:    Component Value Date/Time   COLORURINE YELLOW 09/22/2015 1334   APPEARANCEUR CLOUDY (A) 09/22/2015 1334   LABSPEC 1.007 09/22/2015 1334   PHURINE 5.5 09/22/2015 1334   GLUCOSEU NEGATIVE 09/22/2015 1334   HGBUR MODERATE (A) 09/22/2015 1334   BILIRUBINUR NEGATIVE 09/22/2015 1334   KETONESUR NEGATIVE 09/22/2015 1334   PROTEINUR NEGATIVE 09/22/2015 1334   UROBILINOGEN 0.2 10/29/2014 1521   NITRITE POSITIVE (A) 09/22/2015 1334   LEUKOCYTESUR LARGE (A) 09/22/2015 1334     Radiology Studies: Dg Chest Port 1 View Result Date: 09/22/2015 Atelectasis of RIGHT lower lobe with mild infiltrate versus atelectasis in medial LEFT lower lobe.   Scheduled Meds: . baclofen  20 mg Oral TID  . budesonide  2 mL Inhalation BID  . ceFEPime (MAXIPIME) IV  1 g Intravenous Q8H  . DULoxetine  30 mg Oral BID  . enoxaparin (LOVENOX) injection  30 mg Subcutaneous Q24H  . feeding supplement (ENSURE ENLIVE)  237 mL Oral BID BM  . gabapentin  400 mg Oral TID  . mouth rinse  15 mL Mouth Rinse BID  . midodrine  5 mg Oral TID WC  . multivitamin with minerals  1 tablet Oral Daily   Continuous Infusions: . sodium chloride 100 mL/hr at 09/22/15 1954  . phenylephrine (NEO-SYNEPHRINE) Adult infusion 12 mcg/min (09/23/15 1710)     LOS: 1 day    Time spent: 20 minutes    Faye Ramsay, MD [facility] Pager [phone]  If 7PM-7AM, please contact night-coverage [email or web address] Password [email or web address] 09/23/2015, 9:11 PM

## 2015-09-23 NOTE — Progress Notes (Signed)
PULMONARY / CRITICAL CARE MEDICINE   Name: Angela Page MRN: [id number] DOB: 12/31/1970    ADMISSION DATE:  09/22/2015 CONSULTATION DATE: 09/23/2015  REFERRING MD:  Dr. Doyle Askew, [facility]  CHIEF COMPLAINT:  Altered mental status  SUBJECTIVE:  C/o pain in her hands.  Denies abdominal pain.  Reports her blood pressure normally is 80's to 90's.  VITAL SIGNS: BP (!) 85/55 (BP Location: Left Arm)   Pulse 80   Temp 99 F (37.2 C) (Oral)   Resp (!) 9   Ht 5\' 2"  (1.575 m)   Wt 93 lb (42.2 kg)   LMP  (LMP Unknown)   SpO2 98%   BMI 17.01 kg/m   INTAKE / OUTPUT: I/O last 3 completed shifts: In: 1623.8 [P.O.:75; I.V.:1448.8; IV Piggyback:100] Out: 1485 [Urine:1485]  PHYSICAL EXAMINATION: General:  cachetic Neuro:  alert HEENT:  No stridor Cardiovascular:  Regular, no murmur Lungs:  Basilar crackles Abdomen:  Soft, non tender Musculoskeletal:  contracted Skin:  Sacral ulcer (present prior to admission)  LABS:  BMET  Recent Labs Lab 09/22/15 2018 09/23/15 0407 09/23/15 0627  NA 137 139 139  K 3.3* 3.4* 3.0*  CL 103 109 109  CO2 24 24 24   BUN 26* 21* 20  CREATININE 0.65 0.50 0.40*  GLUCOSE 87 80 82    Electrolytes  Recent Labs Lab 09/22/15 2018 09/23/15 0407 09/23/15 0627  CALCIUM 8.5* 8.5* 8.4*    CBC  Recent Labs Lab 09/22/15 1330 09/22/15 2018 09/23/15 0407  WBC 18.9* 17.9* 18.4*  HGB 12.2 13.4 12.2  HCT 37.4 42.5 37.8  PLT 364 397 383    Coag's  Recent Labs Lab 09/22/15 2018  APTT 21*  INR 0.95    Sepsis Markers  Recent Labs Lab 09/22/15 1828 09/22/15 2018 09/22/15 2238  LATICACIDVEN 1.36 1.2 1.3  PROCALCITON  --  0.18  --     ABG No results for input(s): PHART, PCO2ART, PO2ART in the last 168 hours.  Liver Enzymes  Recent Labs Lab 09/22/15 1330 09/22/15 2018  AST 17 16  ALT 10* 10*  ALKPHOS 133* 140*  BILITOT 0.4 0.7  ALBUMIN 2.5* 2.5*    Cardiac Enzymes No results for input(s): TROPONINI, PROBNP in the last 168  hours.  Glucose No results for input(s): GLUCAP in the last 168 hours.  Imaging Dg Chest Port 1 View  Result Date: 09/22/2015 CLINICAL DATA:  Lethargy, no bowel movement for 3 days, abdominal distension, low urine output, shallow respirations, asthma, former smoker EXAM: PORTABLE CHEST 1 VIEW COMPARISON:  Portable exam 1340 hours compared to 05/07/2015 FINDINGS: LEFT subclavian transvenous pacemaker leads project over RIGHT atrium and RIGHT ventricle, unchanged. Normal heart size, mediastinal contours and pulmonary vascularity. RIGHT lower lobe atelectasis. Mild subsegmental atelectasis versus infiltrate at medial LEFT lower lobe. Upper lungs clear. No pleural effusion or pneumothorax. IMPRESSION: Atelectasis of RIGHT lower lobe with mild infiltrate versus atelectasis in medial LEFT lower lobe. Electronically Signed   By: Lavonia Dana M.D.   On: 09/22/2015 14:17     STUDIES:   CULTURES: 9/02 Blood >> 9/02 Urine >>   ANTIBIOTICS: 9/02 Aztreonam >> 9/02 Vancomycin >>   SIGNIFICANT EVENTS: 9/02 Admit  LINES/TUBES:  DISCUSSION: 45 yo female brought to ER with lethargy, constipation, abdominal distention, low urine outpt, and hypoxia with concern for sepsis from PNA.  She has hx of spinal cord injury with quadriplegia, neurogenic bladder with chronic foley, stage 4 sacral ulcer, and asthma.  ASSESSMENT / PLAN:  PULMONARY A: Acute hypoxic  respiratory failure from PNA. Hx of asthma. P:   Oxygen to keep SpO2 > 92% F/u CXR Pulmicort, albuterol  CARDIOVASCULAR A:  Sepsis. Chronic hypotension >> normal SBP 80's to 90's. P:  Continue IV fluids Continue midodrine Wean pressors to keep SBP > 80, MAP > 55 Check cortisol Defer CVL placement for now  RENAL A:   Neurogenic bladder with chronic foley. Hypokalemia. P:   Monitor urine outpt Replace electrolytes as needed  GASTROINTESTINAL A:   Severe protein calorie malnutrition. P:   Regular diet  HEMATOLOGIC A:    Leukocytosis. P:  F/u CBC Lovenox for DVT prophylaxis  INFECTIOUS A:   Sepsis with concern for HCAP. Chronic foley with recurrent UTIs.  Stage IV sacral wound >> present prior to admission. P:   Day 2 vancomycin, azactam Wound care consult  ENDOCRINE A:   No acute issues. P:   Monitor blood sugar on BMET  NEUROLOGIC A:   C spine injury with quadriplegia. Chronic pain, spacticity. P:   Baclofen, duloxetine, gabapentin Prn oxycodone, tizanidine  CC time 32 minutes  Chesley Mires, MD [place] 09/23/2015, 11:21 AM Pager:  [phone] After 3pm call: [phone]

## 2015-09-23 NOTE — Progress Notes (Signed)
[facility] ADULT ICU REPLACEMENT PROTOCOL FOR AM LAB REPLACEMENT ONLY  The patient does apply for the [facility] Adult ICU Electrolyte Replacment Protocol based on the criteria listed below:   1. Is GFR >/= 40 ml/min? Yes.    Patient's GFR today is >60 2. Is urine output >/= 0.5 ml/kg/hr for the last 6 hours? Yes.   Patient's UOP is No results found for: HIV1RNAQUANT. ml/kg/hr 3. Is BUN < 60 mg/dL? Yes.    Patient's BUN today is 21 4. Abnormal electrolyte(s): k 3.4 5. Ordered repletion with: per protocol 6. If a panic level lab has been reported, has the CCM MD in charge been notified? No..   Physician:    Ronda Fairly A 09/23/2015 4:58 AM

## 2015-09-23 NOTE — Progress Notes (Signed)
Pharmacy Antibiotic Note  Angela Page is a 45 y.o. female with PMHx spinal cord injury with quadriplegia, neurogenic bladder with indwelling foley catheter, asthma, and stage 4 sacral ulcer, admitted on 09/22/2015 with lethargy and low 02 saturations.  Pharmacy was initially consulted for Vancomycin and Aztreonam dosing for PNA, but now narrowed to Cefepime alone.  Noted allergies to erythromycin (N/V), penicillins (hives), and macrobid (N/V).  Pt has tolerated cephalosporins in the past.    Plan:  Cefepime 1g IV q8h  Follow up renal fxn, culture results, and clinical course.   Height: 5\' 2"  (157.5 cm) Weight: 93 lb (42.2 kg) IBW/kg (Calculated) : 50.1  Temp (24hrs), Avg:98.3 F (36.8 C), Min:97.5 F (36.4 C), Max:99 F (37.2 C)   Recent Labs Lab 09/22/15 1330 09/22/15 1349 09/22/15 1828 09/22/15 2018 09/22/15 2238 09/23/15 0407 09/23/15 0627  WBC 18.9*  --   --  17.9*  --  18.4*  --   CREATININE 0.71  --   --  0.65  --  0.50 0.40*  LATICACIDVEN  --  1.68 1.36 1.2 1.3  --   --     Estimated Creatinine Clearance: 59.2 mL/min (by C-G formula based on SCr of 0.8 mg/dL).    Allergies  Allergen Reactions  . Erythromycin Nausea And Vomiting  . Nitrofurantoin Monohyd Macro Nausea And Vomiting  . Penicillins Hives    Has patient had a PCN reaction causing immediate rash, facial/tongue/throat swelling, SOB or lightheadedness with hypotension: Yes Has patient had a PCN reaction causing severe rash involving mucus membranes or skin necrosis: Yes Has patient had a PCN reaction that required hospitalization Yes- went to the DR Has patient had a PCN reaction occurring within the last 10 years: No-childhood allergy If all of the above answers are "NO", then may proceed with Cephalosporin use.     Antimicrobials this admission: 9/2 Vancomycin >> 9/3 9/2 Aztreonam >> 9/3 9/3 Cefepime >>  Dose adjustments this admission:  Microbiology results: 9/2BCx: collected 9/2UCx:  ordered 9/3 MRSA PCR: negative  Thank you for allowing pharmacy to be a part of this patient's care.  Gretta Arab PharmD, BCPS Pager [phone] 09/23/2015 1:53 PM

## 2015-09-23 NOTE — Progress Notes (Signed)
PHARMACY - PHYSICIAN COMMUNICATION CRITICAL VALUE ALERT - BLOOD CULTURE IDENTIFICATION (BCID)  Results for orders placed or performed during the hospital encounter of 09/22/15  Blood Culture ID Panel (Reflexed) (Collected: 09/22/2015  1:40 PM)  Result Value Ref Range   Enterococcus species NOT DETECTED NOT DETECTED   Listeria monocytogenes NOT DETECTED NOT DETECTED   Staphylococcus species DETECTED (A) NOT DETECTED   Staphylococcus aureus NOT DETECTED NOT DETECTED   Methicillin resistance DETECTED (A) NOT DETECTED   Streptococcus species NOT DETECTED NOT DETECTED   Streptococcus agalactiae NOT DETECTED NOT DETECTED   Streptococcus pneumoniae NOT DETECTED NOT DETECTED   Streptococcus pyogenes NOT DETECTED NOT DETECTED   Acinetobacter baumannii NOT DETECTED NOT DETECTED   Enterobacteriaceae species NOT DETECTED NOT DETECTED   Enterobacter cloacae complex NOT DETECTED NOT DETECTED   Escherichia coli NOT DETECTED NOT DETECTED   Klebsiella oxytoca NOT DETECTED NOT DETECTED   Klebsiella pneumoniae NOT DETECTED NOT DETECTED   Proteus species NOT DETECTED NOT DETECTED   Serratia marcescens NOT DETECTED NOT DETECTED   Haemophilus influenzae NOT DETECTED NOT DETECTED   Neisseria meningitidis NOT DETECTED NOT DETECTED   Pseudomonas aeruginosa NOT DETECTED NOT DETECTED   Candida albicans NOT DETECTED NOT DETECTED   Candida glabrata NOT DETECTED NOT DETECTED   Candida krusei NOT DETECTED NOT DETECTED   Candida parapsilosis NOT DETECTED NOT DETECTED   Candida tropicalis NOT DETECTED NOT DETECTED    Name of physician (or Provider) Contacted: Dr. Halford Chessman  Changes to prescribed antibiotics required: None - continue Cefepime.  Leeroy Bock 09/23/2015  2:23 PM

## 2015-09-23 NOTE — Progress Notes (Signed)
Initial Nutrition Assessment  DOCUMENTATION CODES:   Severe malnutrition in context of chronic illness, Underweight  INTERVENTION:   -Provide Ensure Enlive po BID, each supplement provides 350 kcal and 20 grams of protein -Provide Multivitamin with minerals daily -Encourage PO intake -RD to continue to monitor  NUTRITION DIAGNOSIS:   Inadequate oral intake related to lethargy/confusion, poor appetite as evidenced by per patient/family report.  GOAL:   Patient will meet greater than or equal to 90% of their needs  MONITOR:   PO intake, Supplement acceptance, Labs, Weight trends, Skin, I & O's  REASON FOR ASSESSMENT:   Low [place], Other (Comment) (Low BMI)    ASSESSMENT:   45 y.o. 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 Pt was unable to provide history and mother at bedside explained that pt has been more lethargic with poor oral intake.  Patient in room with RN at bedside. RN adjusting patient and RD was unable to perform NFPE. Noticeable depletion in clavicle and facial regions. Pt with no weight loss since January per chart review. Pt is quadriplegic which may contribute to muscle depletion. Pt states she usually eats twice a day, a "brunch" of yogurt and later she has an Ensure supplement. She drinks no more than 1 Ensure a day. RD ordered pt Ensure supplements BID. May need to increase if PO intake does not improve. Per RN, pt ate ~5 bites of an omelette this morning. Patient with increased needs d/t multiple buttocks and sacral wounds. Pt would benefit from daily multi-vitamin.   Medications reviewed. Labs reviewed: Low K  Diet Order:  Diet regular Room service appropriate? Yes; Fluid consistency: Thin  Skin:  Wound (see comment) (Stage IV sacral, stage IV Lt buttocks, and Unstageable Rt buttocks wounds)  Last BM:  9/2 -smear  Height:   Ht Readings from Last  1 Encounters:  09/22/15 5\' 2"  (1.575 m)    Weight:   Wt Readings from Last 1 Encounters:  09/22/15 93 lb (42.2 kg)    Ideal Body Weight:  45 kg (adjusted per quadriplegia)  BMI:  Body mass index is 17.01 kg/m.  Estimated Nutritional Needs:   Kcal:  1300-1500  Protein:  65-75g  Fluid:  1.5L/day  EDUCATION NEEDS:   No education needs identified at this time  Clayton Bibles, MS, RD, LDN Pager: [phone] After Hours Pager: [phone]

## 2015-09-24 ENCOUNTER — Encounter (HOSPITAL_COMMUNITY): Payer: Self-pay

## 2015-09-24 ENCOUNTER — Inpatient Hospital Stay (HOSPITAL_COMMUNITY): Payer: Medicaid Other

## 2015-09-24 DIAGNOSIS — E2740 Unspecified adrenocortical insufficiency: Secondary | ICD-10-CM

## 2015-09-24 LAB — CBC
HCT: 40 % (ref 36.0–46.0)
HEMOGLOBIN: 11.9 g/dL — AB (ref 12.0–15.0)
MCH: 28.4 pg (ref 26.0–34.0)
MCHC: 29.8 g/dL — ABNORMAL LOW (ref 30.0–36.0)
MCV: 95.5 fL (ref 78.0–100.0)
Platelets: 335 10*3/uL (ref 150–400)
RBC: 4.19 MIL/uL (ref 3.87–5.11)
RDW: 15.5 % (ref 11.5–15.5)
WBC: 18.6 10*3/uL — AB (ref 4.0–10.5)

## 2015-09-24 LAB — BASIC METABOLIC PANEL
ANION GAP: 3 — AB (ref 5–15)
BUN: 14 mg/dL (ref 6–20)
CALCIUM: 8.5 mg/dL — AB (ref 8.9–10.3)
CO2: 23 mmol/L (ref 22–32)
Chloride: 112 mmol/L — ABNORMAL HIGH (ref 101–111)
Creatinine, Ser: 0.43 mg/dL — ABNORMAL LOW (ref 0.44–1.00)
Glucose, Bld: 109 mg/dL — ABNORMAL HIGH (ref 65–99)
Potassium: 3.5 mmol/L (ref 3.5–5.1)
SODIUM: 138 mmol/L (ref 135–145)

## 2015-09-24 LAB — URINE CULTURE: Culture: 80000 — AB

## 2015-09-24 MED ORDER — HYDROCORTISONE NA SUCCINATE PF 100 MG IJ SOLR
50.0000 mg | Freq: Four times a day (QID) | INTRAMUSCULAR | Status: DC
  Administered 2015-09-24 – 2015-09-26 (×8): 50 mg via INTRAVENOUS
  Filled 2015-09-24 (×8): qty 2

## 2015-09-24 MED ORDER — DIPHENHYDRAMINE HCL 12.5 MG/5ML PO ELIX
12.5000 mg | ORAL_SOLUTION | Freq: Once | ORAL | Status: AC
  Administered 2015-09-24: 12.5 mg via ORAL
  Filled 2015-09-24: qty 5

## 2015-09-24 MED ORDER — DAKINS (1/4 STRENGTH) 0.125 % EX SOLN
Freq: Every day | CUTANEOUS | Status: AC
  Administered 2015-09-24: 21:00:00
  Administered 2015-09-25: 1
  Administered 2015-09-26: 10:00:00
  Filled 2015-09-24: qty 473

## 2015-09-24 NOTE — Progress Notes (Signed)
PULMONARY / CRITICAL CARE MEDICINE   Name: Angela Page MRN: [id number] DOB: Mar 12, 1970    ADMISSION DATE:  09/22/2015 CONSULTATION DATE: 09/23/2015  REFERRING MD:  Dr. Doyle Askew, [facility]  CHIEF COMPLAINT:  Altered mental status  SUBJECTIVE:  Feels better.  Still has congestion >> weak cough.  Remains on pressors.  VITAL SIGNS: BP (!) 103/59 (BP Location: Left Arm)   Pulse 80   Temp 98 F (36.7 C) (Oral)   Resp 14   Ht 5\' 2"  (1.575 m)   Wt 111 lb 5.3 oz (50.5 kg)   LMP  (LMP Unknown)   SpO2 100%   BMI 20.36 kg/m   INTAKE / OUTPUT: I/O last 3 completed shifts: In: 4797.5 [P.O.:75; I.V.:4522.5; IV Piggyback:200] Out: 2360 [Urine:2360]  PHYSICAL EXAMINATION: General:  cachetic Neuro:  alert HEENT:  No stridor Cardiovascular:  Regular, no murmur Lungs:  Basilar crackles Abdomen:  Soft, non tender Musculoskeletal:  contracted Skin:  Sacral ulcer (present prior to admission)  LABS:  BMET  Recent Labs Lab 09/23/15 0407 09/23/15 0627 09/24/15 0819  NA 139 139 138  K 3.4* 3.0* 3.5  CL 109 109 112*  CO2 24 24 23   BUN 21* 20 14  CREATININE 0.50 0.40* 0.43*  GLUCOSE 80 82 109*    Electrolytes  Recent Labs Lab 09/23/15 0407 09/23/15 0627 09/24/15 0819  CALCIUM 8.5* 8.4* 8.5*    CBC  Recent Labs Lab 09/22/15 2018 09/23/15 0407 09/24/15 0819  WBC 17.9* 18.4* 18.6*  HGB 13.4 12.2 11.9*  HCT 42.5 37.8 40.0  PLT 397 383 335    Coag's  Recent Labs Lab 09/22/15 2018  APTT 21*  INR 0.95    Sepsis Markers  Recent Labs Lab 09/22/15 1828 09/22/15 2018 09/22/15 2238  LATICACIDVEN 1.36 1.2 1.3  PROCALCITON  --  0.18  --     ABG No results for input(s): PHART, PCO2ART, PO2ART in the last 168 hours.  Liver Enzymes  Recent Labs Lab 09/22/15 1330 09/22/15 2018  AST 17 16  ALT 10* 10*  ALKPHOS 133* 140*  BILITOT 0.4 0.7  ALBUMIN 2.5* 2.5*    Cardiac Enzymes No results for input(s): TROPONINI, PROBNP in the last 168  hours.  Glucose No results for input(s): GLUCAP in the last 168 hours.  Imaging Dg Chest Port 1 View  Result Date: 09/24/2015 CLINICAL DATA:  Healthcare associated pneumonia EXAM: PORTABLE CHEST 1 VIEW COMPARISON:  09/22/2015 FINDINGS: Right lower lobe atelectasis/volume loss. Underlying infection is not excluded. Small right pleural effusion. Left lower lobe patchy opacity, atelectasis versus pneumonia. No pneumothorax. The heart is normal in size.  Left subclavian pacemaker. Cervical spine fixation hardware, incompletely visualized. IMPRESSION: Right lower lobe atelectasis/ volume loss, underlying infection not excluded. Small right pleural effusion. Left lower lobe patchy opacity, atelectasis versus pneumonia.a Electronically Signed   By: Julian Hy M.D.   On: 09/24/2015 07:25     STUDIES:   CULTURES: 9/02 Blood >> Coag neg staph 9/02 Urine >>   ANTIBIOTICS: 9/02 Aztreonam >> 9/03 9/02 Vancomycin >> 9/03 9/03 Cefepime >>  SIGNIFICANT EVENTS: 9/02 Admit  LINES/TUBES:  DISCUSSION: 45 yo female brought to ER with lethargy, constipation, abdominal distention, low urine outpt, and hypoxia with concern for sepsis from PNA.  She has hx of spinal cord injury with quadriplegia, neurogenic bladder with chronic foley, stage 4 sacral ulcer, and asthma.  ASSESSMENT / PLAN:  PULMONARY A: Acute hypoxic respiratory failure from PNA. Hx of asthma. P:   Oxygen to keep SpO2 >  92% F/u CXR Pulmicort, albuterol Add mucinex, chest PT  CARDIOVASCULAR A:  Sepsis. Chronic hypotension >> normal SBP 80's to 90's. P:  Continue IV fluids Continue midodrine Wean pressors to keep SBP > 80, MAP > 55 Cortisol 22 from 9/03 >> add solu cortef 9/04 Defer CVL placement for now  RENAL A:   Neurogenic bladder with chronic foley. Hypokalemia. P:   Monitor urine outpt Replace electrolytes as needed  GASTROINTESTINAL A:   Severe protein calorie malnutrition. P:   Regular  diet  HEMATOLOGIC A:   Leukocytosis. P:  F/u CBC Lovenox for DVT prophylaxis  INFECTIOUS A:   Sepsis with concern for HCAP. Chronic foley with recurrent UTIs.  Stage IV sacral wound >> present prior to admission. P:   Day 3 Abx, currently on cefepime Wound care consult  ENDOCRINE A:   Relative adrenal insufficiency. P:   Start solu cortef 9/04  NEUROLOGIC A:   C spine injury with quadriplegia. Chronic pain, spacticity. P:   Baclofen, duloxetine, gabapentin Prn oxycodone, tizanidine  CC time 32 minutes  Chesley Mires, MD [place] 09/24/2015, 10:01 AM Pager:  [phone] After 3pm call: [phone]

## 2015-09-24 NOTE — Consult Note (Signed)
[facility] Nurse wound consult note Reason for Consult:Stage 4 pressure injury to left upper buttocks/sacrum, coccyx, right ischium Wound type:CHronic stage 4 pressure injury Pressure Ulcer POA: Yes Measurement:Left upper buttocks 4 cm x 3 cm x 0.5 cm  Coccyx 3 cm x 3 cm x 0.3 cm  Right ischium 4 cm x 2.4 cm x 2 cm wound bed 100% slough Wound [id number] red Drainage (amount, consistency, odor) Moderate serosanguinous  Foul odor Periwound:INtact Dressing procedure/placement/frequency:Cleanse wounds to sacrum, coccyx with NS and pat gently dry.  Apply Aquacel Ag to wound bed. Cover with ABD pad and tape.  Change daily.   Cleanse ischial wounds with NS and pat gently dry.  Apply Dakin's moistened gauze to wound bed.  Cover with 4x4 gauze and ABD pad/tape.  Change daily.  Will not follow at this time.  Please re-consult if needed.  Domenic Moras RN BSN [place] Pager [phone]

## 2015-09-24 NOTE — Progress Notes (Signed)
Patient ID: Angela Page, female   DOB: 1970/01/21, 45 y.o.   MRN: [id number]    PROGRESS NOTE    Keunna Appiah  [id number] DOB: [birth year] DOA: 09/22/2015  PCP: PROVIDER NOT IN SYSTEM   Brief Narrative:  45 y.o. 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 Pt was unable to provide history and mother at bedside explained that pt has been more lethargic with poor oral intake. No specific symptoms reported like chest pain or dyspnea, no abd or urinary concerns.   ED Course: Pt initially unresponsive but responded to Narcan, initial blood pressure low 74/52 requiring 2 L IVF. Imaging studies notable for RLL and ? LLL PNA. Pt started on Vanc and Aztreonam and TRH asked to admit to SDU for further evaluation. Upon arrival to the SDU, pt had episode of v-tach, v-fib, code blue initiated, due to hypotension, placed on pressors and PCCM consulted for assistance.   Assessment & Plan: Acute hypoxic respiratory failure from PNA - respiratory status stable this AM - keep on oxygen via [place] to maintain O2 sat > 92% - use pulmicort and albuterol  - ABX changed to Maxipime 9/3, continue same regimen fo rnow   Chronic hypotension >> normal SBP 80's to 90's. - Continue IV fluids, midodrine, solucortef added  - pressors per PCCM   Sepsis secondary to UTI, RLL and LLL PNA, unknown pathogen - started on vanc and aztreonam, ABX changed to Maxipime 9/3 and will continue same regimen  - follow up on blood and urine, sputum cultures  - UA suggestive of UTI but could be chronic colonization  Neurogenic bladder with chronic foley  Hypokalemia. - supplemented and WNL this AM  Severe protein calorie malnutrition. - advanced diet, tolerating well   Stage IV sacral wound >> present prior to admission. - wound care consulted   C spine injury with quadriplegia - continue Baclofen, duloxetine,  gabapentin - ok to use Prn oxycodone, tizanidine  Underweight  - Body mass index is 17.01 kg/m.  DVT prophylaxis: Lovenox Sq Code Status: Full  Family Communication: Patient at bedside  Disposition Plan: Keep in SDU   Consultants:   PCCM  Procedures:   Code blue 9/2 - required defibrillation   Antimicrobials:   Aztreonam 9/2 --> 9/3  Vancomycin 9/2 --> 9/3  Maxipime 9/3 -->  Subjective: Still on pressors. No concerns this AM.   Objective: Vitals:   09/24/15 0730 09/24/15 0732 09/24/15 0800 09/24/15 0830  BP:  (!) 103/59    Pulse:  80    Resp: 14 14    Temp:   98 F (36.7 C)   TempSrc:   Oral   SpO2: 99% 99%  100%  Weight:      Height:        Intake/Output Summary (Last 24 hours) at 09/24/15 1315 Last data filed at 09/24/15 0700  Gross per 24 hour  Intake          2434.05 ml  Output             1075 ml  Net          1359.05 ml   Filed Weights   09/22/15 1334 09/23/15 2326  Weight: 42.2 kg (93 lb) 50.5 kg (111 lb 5.3 oz)    Examination:  General exam: Appears calm and comfortable  Respiratory system: Clear to auscultation. Respiratory effort normal. Cardiovascular system: S1 & S2 heard, RRR. No JVD,  murmurs, rubs, gallops or clicks. No pedal edema. Gastrointestinal system: Abdomen is nondistended, soft and nontender. No organomegaly or masses felt.   Data Reviewed: I have personally reviewed following labs and imaging studies  CBC:  Recent Labs Lab 09/22/15 1330 09/22/15 2018 09/23/15 0407 09/24/15 0819  WBC 18.9* 17.9* 18.4* 18.6*  NEUTROABS 15.0* 14.9*  --   --   HGB 12.2 13.4 12.2 11.9*  HCT 37.4 42.5 37.8 40.0  MCV 88.8 91.4 90.0 95.5  PLT 364 397 383 [id number]   Basic Metabolic Panel:  Recent Labs Lab 09/22/15 1330 09/22/15 2018 09/23/15 0407 09/23/15 0627 09/24/15 0819  NA 134* 137 139 139 138  K 3.7 3.3* 3.4* 3.0* 3.5  CL 98* 103 109 109 112*  CO2 25 24 24 24 23   GLUCOSE 104* 87 80 82 109*  BUN 28* 26* 21* 20 14    CREATININE 0.71 0.65 0.50 0.40* 0.43*  CALCIUM 9.1 8.5* 8.5* 8.4* 8.5*   Liver Function Tests:  Recent Labs Lab 09/22/15 1330 09/22/15 2018  AST 17 16  ALT 10* 10*  ALKPHOS 133* 140*  BILITOT 0.4 0.7  PROT 6.4* 6.5  ALBUMIN 2.5* 2.5*   Coagulation Profile:  Recent Labs Lab 09/22/15 2018  INR 0.95   Urine analysis:    Component Value Date/Time   COLORURINE YELLOW 09/22/2015 1334   APPEARANCEUR CLOUDY (A) 09/22/2015 1334   LABSPEC 1.007 09/22/2015 1334   PHURINE 5.5 09/22/2015 1334   GLUCOSEU NEGATIVE 09/22/2015 1334   HGBUR MODERATE (A) 09/22/2015 1334   BILIRUBINUR NEGATIVE 09/22/2015 1334   KETONESUR NEGATIVE 09/22/2015 1334   PROTEINUR NEGATIVE 09/22/2015 1334   UROBILINOGEN 0.2 10/29/2014 1521   NITRITE POSITIVE (A) 09/22/2015 1334   LEUKOCYTESUR LARGE (A) 09/22/2015 1334     Radiology Studies: Dg Chest Port 1 View Result Date: 09/22/2015 Atelectasis of RIGHT lower lobe with mild infiltrate versus atelectasis in medial LEFT lower lobe.   Scheduled Meds: . baclofen  20 mg Oral TID  . budesonide  2 mL Inhalation BID  . ceFEPime (MAXIPIME) IV  1 g Intravenous Q8H  . DULoxetine  30 mg Oral BID  . enoxaparin (LOVENOX) injection  30 mg Subcutaneous Q24H  . feeding supplement (ENSURE ENLIVE)  237 mL Oral BID BM  . gabapentin  400 mg Oral TID  . hydrocortisone sod succinate (SOLU-CORTEF) inj  50 mg Intravenous Q6H  . mouth rinse  15 mL Mouth Rinse BID  . midodrine  5 mg Oral TID WC  . multivitamin with minerals  1 tablet Oral Daily  . sodium hypochlorite   Irrigation Daily   Continuous Infusions: . sodium chloride 100 mL/hr at 09/22/15 1954  . phenylephrine (NEO-SYNEPHRINE) Adult infusion 25 mcg/min (09/24/15 1032)     LOS: 2 days    Time spent: 20 minutes    Faye Ramsay, MD [facility] Pager [phone]  If 7PM-7AM, please contact night-coverage [email or web address] Password [email or web address] 09/24/2015, 1:15 PM

## 2015-09-25 DIAGNOSIS — R6521 Severe sepsis with septic shock: Secondary | ICD-10-CM

## 2015-09-25 LAB — BASIC METABOLIC PANEL
Anion gap: 4 — ABNORMAL LOW (ref 5–15)
BUN: 11 mg/dL (ref 6–20)
CALCIUM: 8.6 mg/dL — AB (ref 8.9–10.3)
CO2: 27 mmol/L (ref 22–32)
CREATININE: 0.37 mg/dL — AB (ref 0.44–1.00)
Chloride: 111 mmol/L (ref 101–111)
GFR calc non Af Amer: >60 mL/min (ref >60)
GLUCOSE: 151 mg/dL — AB (ref 65–99)
Potassium: 3.6 mmol/L (ref 3.5–5.1)
Sodium: 142 mmol/L (ref 135–145)

## 2015-09-25 LAB — CBC
HCT: 39.5 % (ref 36.0–46.0)
Hemoglobin: 12 g/dL (ref 12.0–15.0)
MCH: 28.3 pg (ref 26.0–34.0)
MCHC: 30.4 g/dL (ref 30.0–36.0)
MCV: 93.2 fL (ref 78.0–100.0)
PLATELETS: 296 10*3/uL (ref 150–400)
RBC: 4.24 MIL/uL (ref 3.87–5.11)
RDW: 15.1 % (ref 11.5–15.5)
WBC: 13.2 10*3/uL — ABNORMAL HIGH (ref 4.0–10.5)

## 2015-09-25 LAB — CULTURE, BLOOD (ROUTINE X 2)

## 2015-09-25 MED ORDER — MIDODRINE HCL 5 MG PO TABS
10.0000 mg | ORAL_TABLET | Freq: Three times a day (TID) | ORAL | Status: DC
  Administered 2015-09-25 – 2015-09-27 (×7): 10 mg via ORAL
  Filled 2015-09-25 (×8): qty 2

## 2015-09-25 MED ORDER — ENOXAPARIN SODIUM 40 MG/0.4ML ~~LOC~~ SOLN
40.0000 mg | SUBCUTANEOUS | Status: DC
  Administered 2015-09-25: 40 mg via SUBCUTANEOUS
  Filled 2015-09-25: qty 0.4

## 2015-09-25 MED ORDER — NICOTINE 21 MG/24HR TD PT24
21.0000 mg | MEDICATED_PATCH | Freq: Every day | TRANSDERMAL | Status: DC
  Administered 2015-09-25 – 2015-09-27 (×3): 21 mg via TRANSDERMAL
  Filled 2015-09-25 (×3): qty 1

## 2015-09-25 NOTE — Progress Notes (Addendum)
Patient ID: Angela Page, female   DOB: 1970/02/21, 45 y.o.   MRN: [id number]    PROGRESS NOTE    Angela Page  [id number] DOB: [birth year] DOA: 09/22/2015  PCP: PROVIDER NOT IN SYSTEM   Brief Narrative:  45 y.o. 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 Pt was unable to provide history and mother at bedside explained that pt has been more lethargic with poor oral intake. No specific symptoms reported like chest pain or dyspnea, no abd or urinary concerns.   ED Course: Pt initially unresponsive but responded to Narcan, initial blood pressure low 74/52 requiring 2 L IVF. Imaging studies notable for RLL and ? LLL PNA. Pt started on Vanc and Aztreonam and TRH asked to admit to SDU for further evaluation. Upon arrival to the SDU, pt had episode of v-tach, v-fib, code blue initiated, due to hypotension, placed on pressors and PCCM consulted for assistance.   Assessment & Plan: Acute hypoxic respiratory failure from PNA - respiratory status stable this AM - keep on oxygen via  to maintain O2 sat > 92% - continue to use pulmicort and albuterol  - ABX changed to Maxipime 9/3, continue same regimen fo now   Chronic hypotension >> normal SBP 80's to 90's. - Continue IV fluids, midodrine, solucortef added  - pressors per PCCM, appreciate assistance   Sepsis secondary to UTI, RLL and LLL PNA, unknown pathogen - started on vanc and aztreonam, ABX changed to Maxipime 9/3 and will continue same regimen  - follow up on blood and urine, sputum cultures  - UA suggestive of UTI but could be chronic colonization  Acute metabolic encephalopathy - secondary to the above, sepsis and hypotension - now resolved   Neurogenic bladder with chronic foley  Hypokalemia. - supplemented and WNL this AM  Severe protein calorie malnutrition. - advanced diet, tolerating well   Stage IV sacral  wound >> present prior to admission. - wound care consulted  - right ischium, chronic stage 4 pressure injury, pressure ulcer  - Left upper buttocks 4 cm x 3 cm x 0.5 cm  - Coccyx 3 cm x 3 cm x 0.3 cm  - Right ischium 4 cm x 2.4 cm x 2 cm wound bed 100% slough - WOC recommends to cleanse wounds to sacrum, coccyx with NS and pat gently dry.  Apply Aquacel Ag to wound bed. Cover with ABD pad and tape.  Change daily, appreciate recommendations   C spine injury with quadriplegia - continue Baclofen, duloxetine, gabapentin - ok to use Prn oxycodone, tizanidine  Underweight  - Body mass index is 17.01 kg/m.  DVT prophylaxis: Lovenox Sq Code Status: Full  Family Communication: Patient at bedside  Disposition Plan: Keep in SDU while on pressors   Consultants:   PCCM  Procedures:   Code blue 9/2 - required defibrillation   Antimicrobials:   Aztreonam 9/2 --> 9/3  Vancomycin 9/2 --> 9/3  Maxipime 9/3 -->  Subjective: Still on pressors. No concerns this AM.   Objective: Vitals:   09/25/15 1430 09/25/15 1500 09/25/15 1530 09/25/15 1600  BP: 103/65 100/70 105/87 100/61  Pulse:      Resp: (!) 9 10 11 10   Temp:      TempSrc:      SpO2: 96% 95% 94% 93%  Weight:      Height:        Intake/Output Summary (Last 24 hours) at 09/25/15 1656  Last data filed at 09/25/15 1600  Gross per 24 hour  Intake          4845.14 ml  Output             2070 ml  Net          2775.14 ml   Filed Weights   09/22/15 1334 09/23/15 2326  Weight: 42.2 kg (93 lb) 50.5 kg (111 lb 5.3 oz)    Examination:  General exam: Appears calm and comfortable  Respiratory system: Clear to auscultation. Respiratory effort normal. Cardiovascular system: S1 & S2 heard, RRR. No JVD, murmurs, rubs, gallops or clicks. No pedal edema. Gastrointestinal system: Abdomen is nondistended, soft and nontender. No organomegaly or masses felt.   Data Reviewed: I have personally reviewed following labs and imaging  studies  CBC:  Recent Labs Lab 09/22/15 1330 09/22/15 2018 09/23/15 0407 09/24/15 0819 09/25/15 0346  WBC 18.9* 17.9* 18.4* 18.6* 13.2*  NEUTROABS 15.0* 14.9*  --   --   --   HGB 12.2 13.4 12.2 11.9* 12.0  HCT 37.4 42.5 37.8 40.0 39.5  MCV 88.8 91.4 90.0 95.5 93.2  PLT 364 397 383 335 0000000   Basic Metabolic Panel:  Recent Labs Lab 09/22/15 2018 09/23/15 0407 09/23/15 0627 09/24/15 0819 09/25/15 0346  NA 137 139 139 138 142  K 3.3* 3.4* 3.0* 3.5 3.6  CL 103 109 109 112* 111  CO2 24 24 24 23 27   GLUCOSE 87 80 82 109* 151*  BUN 26* 21* 20 14 11   CREATININE 0.65 0.50 0.40* 0.43* 0.37*  CALCIUM 8.5* 8.5* 8.4* 8.5* 8.6*   Liver Function Tests:  Recent Labs Lab 09/22/15 1330 09/22/15 2018  AST 17 16  ALT 10* 10*  ALKPHOS 133* 140*  BILITOT 0.4 0.7  PROT 6.4* 6.5  ALBUMIN 2.5* 2.5*   Coagulation Profile:  Recent Labs Lab 09/22/15 2018  INR 0.95   Urine analysis:    Component Value Date/Time   COLORURINE YELLOW 09/22/2015 1334   APPEARANCEUR CLOUDY (A) 09/22/2015 1334   LABSPEC 1.007 09/22/2015 1334   PHURINE 5.5 09/22/2015 1334   GLUCOSEU NEGATIVE 09/22/2015 1334   HGBUR MODERATE (A) 09/22/2015 1334   BILIRUBINUR NEGATIVE 09/22/2015 1334   KETONESUR NEGATIVE 09/22/2015 1334   PROTEINUR NEGATIVE 09/22/2015 1334   UROBILINOGEN 0.2 10/29/2014 1521   NITRITE POSITIVE (A) 09/22/2015 1334   LEUKOCYTESUR LARGE (A) 09/22/2015 1334     Radiology Studies: Dg Chest Port 1 View Result Date: 09/22/2015 Atelectasis of RIGHT lower lobe with mild infiltrate versus atelectasis in medial LEFT lower lobe.   Scheduled Meds: . baclofen  20 mg Oral TID  . budesonide  2 mL Inhalation BID  . ceFEPime (MAXIPIME) IV  1 g Intravenous Q8H  . DULoxetine  30 mg Oral BID  . enoxaparin (LOVENOX) injection  40 mg Subcutaneous Q24H  . feeding supplement (ENSURE ENLIVE)  237 mL Oral BID BM  . gabapentin  400 mg Oral TID  . hydrocortisone sod succinate (SOLU-CORTEF) inj  50 mg  Intravenous Q6H  . mouth rinse  15 mL Mouth Rinse BID  . midodrine  10 mg Oral TID WC  . multivitamin with minerals  1 tablet Oral Daily  . nicotine  21 mg Transdermal Daily  . sodium hypochlorite   Irrigation Daily   Continuous Infusions: . sodium chloride 100 mL/hr at 09/22/15 1954  . phenylephrine (NEO-SYNEPHRINE) Adult infusion Stopped (09/25/15 1307)     LOS: 3 days    Time  spent: 20 minutes    Faye Ramsay, MD [facility] Pager [phone]  If 7PM-7AM, please contact night-coverage [email or web address] Password [email or web address] 09/25/2015, 4:56 PM

## 2015-09-25 NOTE — Progress Notes (Signed)
PULMONARY / CRITICAL CARE MEDICINE   Name: Angela Page MRN: [id number] DOB: [birth year]    ADMISSION DATE:  09/22/2015 CONSULTATION DATE: 09/23/2015  REFERRING MD:  Dr. Doyle Askew, [facility]  CHIEF COMPLAINT:  Altered mental status  SUBJECTIVE:  Feeling better.   VITAL SIGNS: BP 100/69 (BP Location: Left Arm)   Pulse 80   Temp 97.7 F (36.5 C) (Oral)   Resp 20   Ht 5\' 2"  (1.575 m)   Wt 111 lb 5.3 oz (50.5 kg)   LMP  (LMP Unknown)   SpO2 94%   BMI 20.36 kg/m   INTAKE / OUTPUT: I/O last 3 completed shifts: In: 4461.2 [I.V.:4211.2; IV Piggyback:250] Out: 2250 [Urine:2250]  PHYSICAL EXAMINATION: General:  Cachetic, no distress.  Neuro:  Alert, oriented. Quad  HEENT:  No stridor, MMM Cardiovascular:  Regular, no murmur Lungs:  Basilar crackles, decreased bases  Abdomen:  Soft, non tender Musculoskeletal:  contracted Skin:  Sacral ulcer (present prior to admission)  LABS:  BMET  Recent Labs Lab 09/23/15 0627 09/24/15 0819 09/25/15 0346  NA 139 138 142  K 3.0* 3.5 3.6  CL 109 112* 111  CO2 24 23 27   BUN 20 14 11   CREATININE 0.40* 0.43* 0.37*  GLUCOSE 82 109* 151*    Electrolytes  Recent Labs Lab 09/23/15 0627 09/24/15 0819 09/25/15 0346  CALCIUM 8.4* 8.5* 8.6*    CBC  Recent Labs Lab 09/23/15 0407 09/24/15 0819 09/25/15 0346  WBC 18.4* 18.6* 13.2*  HGB 12.2 11.9* 12.0  HCT 37.8 40.0 39.5  PLT 383 335 296    Coag's  Recent Labs Lab 09/22/15 2018  APTT 21*  INR 0.95    Sepsis Markers  Recent Labs Lab 09/22/15 1828 09/22/15 2018 09/22/15 2238  LATICACIDVEN 1.36 1.2 1.3  PROCALCITON  --  0.18  --     ABG No results for input(s): PHART, PCO2ART, PO2ART in the last 168 hours.  Liver Enzymes  Recent Labs Lab 09/22/15 1330 09/22/15 2018  AST 17 16  ALT 10* 10*  ALKPHOS 133* 140*  BILITOT 0.4 0.7  ALBUMIN 2.5* 2.5*    Cardiac Enzymes No results for input(s): TROPONINI, PROBNP in the last 168 hours.  Glucose No results  for input(s): GLUCAP in the last 168 hours.  Imaging No results found. 9/4 right basilar atx/volume loss.   STUDIES:   CULTURES: 9/02 Blood >> Coag neg staph 9/02 Urine >> mult species  9/5 UC>>>  ANTIBIOTICS: 9/02 Aztreonam >> 9/03 9/02 Vancomycin >> 9/03 9/03 Cefepime >>  SIGNIFICANT EVENTS: 9/02 Admit 9/5 still pressor dependent. Increased midodrine  LINES/TUBES:  DISCUSSION: 45 yo female brought to ER with lethargy, constipation, abdominal distention, low urine outpt, and hypoxia with concern for sepsis from PNA and UTI. Clinically looks much better. Still very sensitive to pressors and not off Neo yet. For today we will increase midodrine to 10mg  tid, re-culture urine and continue supportive care.    She has hx of spinal cord injury with quadriplegia, neurogenic bladder with chronic foley, stage 4 sacral ulcer, and asthma.  ASSESSMENT / PLAN:  PULMONARY A: Acute hypoxic respiratory failure from PNA/atx Hx of asthma. P:   Oxygen to keep SpO2 > 92% F/u CXR Pulmicort, albuterol cont mucinex, chest PT  CARDIOVASCULAR A:  Sepsis. Chronic hypotension >> normal SBP 80's to 90's. P:  Continue IV fluids Cont stress dose steroids  Continue midodrine; but increase to 10mg  tid Wean pressors to keep SBP > 80, MAP > 55 Defer CVL placement for  now  RENAL A:   Neurogenic bladder with chronic foley. Hypokalemia. P:   Monitor urine outpt Replace electrolytes as needed  GASTROINTESTINAL A:   Severe protein calorie malnutrition. P:   Regular diet  HEMATOLOGIC A:   Leukocytosis. P:  F/u CBC Lovenox for DVT prophylaxis  INFECTIOUS A:   Sepsis with concern for HCAP. Chronic foley with recurrent UTIs.  Has 1/2 coag neg BC-->favor contamination  Stage IV sacral wound >> present prior to admission. P:   Day 4 Abx, currently on cefepime Wound care consulted--> aquacel Ag to sacrum/coccyx daily Dakin's moistened gauze to ischial wounds  daily  ENDOCRINE A:   Relative adrenal insufficiency. P:   solu cortef started 9/04  NEUROLOGIC A:   C spine injury with quadriplegia. Chronic pain, spacticity. P:   Baclofen, duloxetine, gabapentin Prn oxycodone, tizanidine

## 2015-09-25 NOTE — Progress Notes (Signed)
CSW consulted to assist with ambulance transport home at d/c. Please contact RNCM for assistance with this request.   Werner Lean LCSW [phone]

## 2015-09-26 DIAGNOSIS — J189 Pneumonia, unspecified organism: Secondary | ICD-10-CM

## 2015-09-26 LAB — CBC
HEMATOCRIT: 37.5 % (ref 36.0–46.0)
HEMOGLOBIN: 11.7 g/dL — AB (ref 12.0–15.0)
MCH: 28.2 pg (ref 26.0–34.0)
MCHC: 31.2 g/dL (ref 30.0–36.0)
MCV: 90.4 fL (ref 78.0–100.0)
Platelets: 298 10*3/uL (ref 150–400)
RBC: 4.15 MIL/uL (ref 3.87–5.11)
RDW: 14.8 % (ref 11.5–15.5)
WBC: 10.7 10*3/uL — ABNORMAL HIGH (ref 4.0–10.5)

## 2015-09-26 LAB — COMPREHENSIVE METABOLIC PANEL
ALK PHOS: 98 U/L (ref 38–126)
ALT: 11 U/L — ABNORMAL LOW (ref 14–54)
ANION GAP: 5 (ref 5–15)
AST: 18 U/L (ref 15–41)
Albumin: 1.7 g/dL — ABNORMAL LOW (ref 3.5–5.0)
BILIRUBIN TOTAL: 0.6 mg/dL (ref 0.3–1.2)
BUN: 14 mg/dL (ref 6–20)
CALCIUM: 8.4 mg/dL — AB (ref 8.9–10.3)
CO2: 22 mmol/L (ref 22–32)
Chloride: 109 mmol/L (ref 101–111)
Creatinine, Ser: 0.31 mg/dL — ABNORMAL LOW (ref 0.44–1.00)
GLUCOSE: 122 mg/dL — AB (ref 65–99)
POTASSIUM: 3.5 mmol/L (ref 3.5–5.1)
Sodium: 136 mmol/L (ref 135–145)
TOTAL PROTEIN: 4.9 g/dL — AB (ref 6.5–8.1)

## 2015-09-26 LAB — URINE CULTURE: Culture: >=100000 — AB

## 2015-09-26 LAB — BLOOD GAS, VENOUS
BICARBONATE: 8 mmol/L — AB (ref 20.0–28.0)
DRAWN BY: 257701
O2 SAT: 55.7 %
PCO2 VEN: 21.3 mmHg — AB (ref 44.0–60.0)
Patient temperature: 98.6
pH, Ven: 7.201 — ABNORMAL LOW (ref 7.250–7.430)
pO2, Ven: 34.4 mmHg (ref 32.0–45.0)

## 2015-09-26 MED ORDER — HYDROCORTISONE 10 MG PO TABS: 10.0000 mg | ORAL_TABLET | Freq: Every day | ORAL | Status: DC

## 2015-09-26 MED ORDER — BISACODYL 5 MG PO TBEC
10.0000 mg | DELAYED_RELEASE_TABLET | Freq: Every day | ORAL | Status: DC | PRN
  Administered 2015-09-26: 10 mg via ORAL
  Filled 2015-09-26: qty 2

## 2015-09-26 MED ORDER — HYDROCORTISONE 20 MG PO TABS
50.0000 mg | ORAL_TABLET | Freq: Every day | ORAL | Status: AC
  Administered 2015-09-26: 50 mg via ORAL
  Filled 2015-09-26: qty 1

## 2015-09-26 MED ORDER — HYDROCORTISONE 20 MG PO TABS: 30.0000 mg | ORAL_TABLET | Freq: Every day | ORAL | Status: DC

## 2015-09-26 MED ORDER — LORAZEPAM 1 MG PO TABS
1.0000 mg | ORAL_TABLET | Freq: Every day | ORAL | Status: DC
  Administered 2015-09-26: 1 mg via ORAL
  Filled 2015-09-26: qty 1

## 2015-09-26 MED ORDER — HYDROCORTISONE 20 MG PO TABS: 20.0000 mg | ORAL_TABLET | Freq: Every day | ORAL | Status: DC

## 2015-09-26 MED ORDER — HYDROCORTISONE 20 MG PO TABS
40.0000 mg | ORAL_TABLET | Freq: Every day | ORAL | Status: AC
  Administered 2015-09-27: 40 mg via ORAL
  Filled 2015-09-26: qty 2

## 2015-09-26 MED ORDER — BISACODYL 5 MG PO TBEC: 10.0000 mg | DELAYED_RELEASE_TABLET | Freq: Every day | ORAL | Status: DC | PRN

## 2015-09-26 NOTE — Progress Notes (Signed)
PULMONARY / CRITICAL CARE MEDICINE   Name: Angela Page MRN: [id number] DOB: February 23, 1970    ADMISSION DATE:  09/22/2015 CONSULTATION DATE: 09/23/2015  REFERRING MD:  Dr. Doyle Askew, [facility]  CHIEF COMPLAINT:  Altered mental status  SUBJECTIVE:  Feeling better still   VITAL SIGNS: BP 126/84 (BP Location: Left Arm)   Pulse 80   Temp 98.2 F (36.8 C) (Oral)   Resp 12   Ht 5\' 2"  (1.575 m)   Wt 111 lb 5.3 oz (50.5 kg)   LMP  (LMP Unknown)   SpO2 95%   BMI 20.36 kg/m  Room air  INTAKE / OUTPUT: I/O last 3 completed shifts: In: 6597.3 [P.O.:1290; I.V.:4887.3; Other:120; IV Piggyback:300] Out: 2150 [Urine:2150]  PHYSICAL EXAMINATION: General:  Cachetic, no distress.  Neuro:  Alert, oriented. Quad  HEENT:  No stridor, MMM Cardiovascular:  Regular, no murmur Lungs:  Basilar crackles, decreased bases no accessory use  Abdomen:  Soft, non tender Musculoskeletal:  contracted Skin:  Sacral ulcer (present prior to admission)  LABS:  BMET  Recent Labs Lab 09/24/15 0819 09/25/15 0346 09/26/15 0327  NA 138 142 136  K 3.5 3.6 3.5  CL 112* 111 109  CO2 23 27 22   BUN 14 11 14   CREATININE 0.43* 0.37* 0.31*  GLUCOSE 109* 151* 122*    Electrolytes  Recent Labs Lab 09/24/15 0819 09/25/15 0346 09/26/15 0327  CALCIUM 8.5* 8.6* 8.4*    CBC  Recent Labs Lab 09/24/15 0819 09/25/15 0346 09/26/15 0327  WBC 18.6* 13.2* 10.7*  HGB 11.9* 12.0 11.7*  HCT 40.0 39.5 37.5  PLT 335 296 298    Coag's  Recent Labs Lab 09/22/15 2018  APTT 21*  INR 0.95    Sepsis Markers  Recent Labs Lab 09/22/15 1828 09/22/15 2018 09/22/15 2238  LATICACIDVEN 1.36 1.2 1.3  PROCALCITON  --  0.18  --     ABG No results for input(s): PHART, PCO2ART, PO2ART in the last 168 hours.  Liver Enzymes  Recent Labs Lab 09/22/15 1330 09/22/15 2018 09/26/15 0327  AST 17 16 18   ALT 10* 10* 11*  ALKPHOS 133* 140* 98  BILITOT 0.4 0.7 0.6  ALBUMIN 2.5* 2.5* 1.7*    Cardiac  Enzymes No results for input(s): TROPONINI, PROBNP in the last 168 hours.  Glucose No results for input(s): GLUCAP in the last 168 hours.  Imaging No results found. 9/4 right basilar atx/volume loss.   STUDIES:   CULTURES: 9/02 Blood >> Coag neg staph 9/02 Urine >> mult species  9/5 UC>>>  ANTIBIOTICS: 9/02 Aztreonam >> 9/03 9/02 Vancomycin >> 9/03 9/03 Cefepime >>  SIGNIFICANT EVENTS: 9/02 Admit 9/5 still pressor dependent. Increased midodrine-->off pressors   LINES/TUBES:  DISCUSSION: 45 yo female brought to ER with lethargy, constipation, abdominal distention, low urine outpt, and hypoxia with concern for sepsis from PNA and UTI. Clinically looks much better. Off pressors. Ok to transfer to floor. Will taper steroids over next 5d. Cont abx. F/u Repeat UC. Cont supportive care. We will s/o   She has hx of spinal cord injury with quadriplegia, neurogenic bladder with chronic foley, stage 4 sacral ulcer, and asthma.  ASSESSMENT / PLAN:  PULMONARY A: Acute hypoxic respiratory failure from PNA/atx Hx of asthma. -->resolved now on room air P:   Oxygen to keep SpO2 > 92% Pulmicort, albuterol cont mucinex, chest PT  CARDIOVASCULAR A:  Sepsis. Chronic hypotension >> normal SBP 80's to 90's. P:  Continue IV fluids-->decrease rate Cont stress dose steroids; can begin to  taper this to off over next 5d.   Continue midodrine at 10mg  tid   RENAL A:   Neurogenic bladder with chronic foley. Hypokalemia. P:   Monitor urine outpt Replace electrolytes as needed  GASTROINTESTINAL A:   Severe protein calorie malnutrition. P:   Regular diet  HEMATOLOGIC A:   Leukocytosis. P:  F/u CBC Lovenox for DVT prophylaxis  INFECTIOUS A:   Sepsis with concern for HCAP. Chronic foley with recurrent UTIs.  Has 1/2 coag neg BC-->favor contamination  Stage IV sacral wound >> present prior to admission. P:   Day 5 Abx, currently on cefepime Wound care  consulted--> aquacel Ag to sacrum/coccyx daily Dakin's moistened gauze to ischial wounds daily  ENDOCRINE A:   Relative adrenal insufficiency. P:   solu cortef started 9/04-->begin taper   NEUROLOGIC A:   C spine injury with quadriplegia. Chronic pain, spacticity. P:   Baclofen, duloxetine, gabapentin Prn oxycodone, tizanidine   Erick Colace ACNP-BC [place] Pager # [phone] OR # [phone] if no answer

## 2015-09-26 NOTE — Progress Notes (Signed)
Nutrition Follow-up  DOCUMENTATION CODES:   Severe malnutrition in context of chronic illness, Underweight  INTERVENTION:  - Continue Ensure Enlive BID. - Continue to encourage pt with PO intakes of meals and supplements. - RD will continue to monitor for additional nutrition-related needs.  NUTRITION DIAGNOSIS:   Inadequate oral intake related to lethargy/confusion, poor appetite as evidenced by per patient/family report. -improving.  GOAL:   Patient will meet greater than or equal to 90% of their needs -unmet at this time.  MONITOR:   PO intake, Supplement acceptance, Labs, Weight trends, Skin, I & O's  ASSESSMENT:   45 y.o. 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 Pt was unable to provide history and mother at bedside explained that pt has been more lethargic with poor oral intake.  9/6 Per review, weight recorded as 93 lbs on 9/2 and as 111 lbs on 9/3; updated estimated nutrition needs based on weight from 9/3 after visualizing pt during visit this afternoon. Per chart review, pt ate 50% of breakfast this AM and 40% of both breakfast and dinner yesterday. Pt reports that for breakfast she had an omelet with cheese and bacon and that after breakfast she drank 100% of Ensure Enlive.   Pt reports that PTA she was drinking Ensure BID (had previously reported only once/day) and is interested in maintaining BID Ensure Enlive during hospitalization. Pt reports that when she is sick she has a poor appetite. Pt reports that appetite is slowly improving each day. She denies any feelings of nausea or abdominal pain with PO intakes this AM.   Physical assessment performed this AM and shows mild fat wasting to orbital area and upper arm but no other fat or muscle wasting noted at this time. Mild edema to BLE. Will continue to monitor weight trends and will adjust estimated nutrition needs  as warranted.  Pt not fully meeting estimated needs at this time.   Medications reviewed; PRN Dulcolax, Cortef taper starting today to end 9/10, MEDLINE mouth rinse, daily multivitamin with minerals, PRN Zofran.  Labs reviewed; creatinine: 0.31 mg/dL, Ca: 8.4 mg/dL. IVF: NS @ 100 mL/hr.    9/3 - RN adjusting patient and RD was unable to perform NFPE.  - Noticeable depletion in clavicle and facial regions.  - Pt with no weight loss since January per chart review.  - Pt is quadriplegic which may contribute to muscle depletion.  - Pt states she usually eats twice a day, a "brunch" of yogurt and later she has an Ensure supplement. She drinks no more than 1 Ensure a day. RD ordered pt Ensure supplements BID. - Per RN, pt ate ~5 bites of an omelette this morning. - Patient with increased needs d/t multiple buttocks and sacral wounds. Pt would benefit from daily multi-vitamin.    Diet Order:  Diet regular Room service appropriate? Yes; Fluid consistency: Thin  Skin:  Wound (see comment) (Stage sacral and L buttocks pressure injuries, Unstageable pressure injury to R buttocks)  Last BM:  9/2  Height:   Ht Readings from Last 1 Encounters:  09/22/15 5\' 2"  (1.575 m)    Weight:   Wt Readings from Last 1 Encounters:  09/23/15 111 lb 5.3 oz (50.5 kg)    Ideal Body Weight:  45 kg (adjusted per quadriplegia)  BMI:  Body mass index is 20.36 kg/m.  Estimated Nutritional Needs:   Kcal:  1515-1770 (30-35 kcal/kg)  Protein:  75-85  grams  Fluid:  1.5-1.8 L/day  EDUCATION NEEDS:   No education needs identified at this time    Jarome Matin, MS, RD, LDN Inpatient Clinical Dietitian Pager # [phone] After hours/weekend pager # [phone]

## 2015-09-26 NOTE — Progress Notes (Signed)
Transferred to room 1436 via bed.

## 2015-09-26 NOTE — Progress Notes (Signed)
Pharmacy Antibiotic Note  Angela Page is a 45 y.o. female with PMHx spinal cord injury with quadriplegia, neurogenic bladder with indwelling foley catheter, asthma, and stage 4 sacral ulcer, admitted on 09/22/2015 with lethargy and low 02 saturations.  Pharmacy was initially consulted for Vancomycin and Aztreonam dosing for PNA, but now narrowed to Cefepime alone.  Noted allergies to erythromycin (N/V), penicillins (hives), and macrobid (N/V).  Pt has tolerated cephalosporins in the past.    Plan:  Continue Cefepime 1g IV q8h  When ready to transition to PO, recommend cefpodoxime 200mg  PO bid or levaquin 750mg  daily    Height: 5\' 2"  (157.5 cm) Weight: 111 lb 5.3 oz (50.5 kg) IBW/kg (Calculated) : 50.1  Temp (24hrs), Avg:98.3 F (36.8 C), Min:97.7 F (36.5 C), Max:98.6 F (37 C)   Recent Labs Lab 09/22/15 1349 09/22/15 1828 09/22/15 2018 09/22/15 2238 09/23/15 0407 09/23/15 0627 09/24/15 0819 09/25/15 0346 09/26/15 0327  WBC  --   --  17.9*  --  18.4*  --  18.6* 13.2* 10.7*  CREATININE  --   --  0.65  --  0.50 0.40* 0.43* 0.37* 0.31*  LATICACIDVEN 1.68 1.36 1.2 1.3  --   --   --   --   --     Estimated Creatinine Clearance: 70.2 mL/min (by C-G formula based on SCr of 0.8 mg/dL).    Allergies  Allergen Reactions  . Erythromycin Nausea And Vomiting  . Nitrofurantoin Monohyd Macro Nausea And Vomiting  . Penicillins Hives    Has patient had a PCN reaction causing immediate rash, facial/tongue/throat swelling, SOB or lightheadedness with hypotension: Yes Has patient had a PCN reaction causing severe rash involving mucus membranes or skin necrosis: Yes Has patient had a PCN reaction that required hospitalization Yes- went to the DR Has patient had a PCN reaction occurring within the last 10 years: No-childhood allergy If all of the above answers are "NO", then may proceed with Cephalosporin use.     Antimicrobials this admission: 9/2 Vancomycin >> 9/3 9/2 Aztreonam  >> 9/3 9/3 Cefepime >>   Dose adjustments this admission:   Microbiology results: 9/2 BCx: 1/2 CoNS, likely contaminant.   9/2 UCx: 80k yeast 9/3 UCx multiple species (F) 9/3 MRSA PCR: negative 9/5 UCx: sent  Thank you for allowing pharmacy to be a part of this patient's care.  Peggyann Juba, PharmD, BCPS Pager: [phone] 09/26/2015 10:26 AM

## 2015-09-26 NOTE — Progress Notes (Signed)
Patient ID: Angela Page, female   DOB: [birth year], 45 y.o.   MRN: [id number]    PROGRESS NOTE    Angela Page  [id number] DOB: 11/05/1970 DOA: 09/22/2015  PCP: PROVIDER NOT IN SYSTEM   Brief Narrative:  45 y.o. 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 Pt was unable to provide history and mother at bedside explained that pt has been more lethargic with poor oral intake. No specific symptoms reported like chest pain or dyspnea, no abd or urinary concerns.   ED Course: Pt initially unresponsive but responded to Narcan, initial blood pressure low 74/52 requiring 2 L IVF. Imaging studies notable for RLL and ? LLL PNA. Pt started on Vanc and Aztreonam and TRH asked to admit to SDU for further evaluation. Upon arrival to the SDU, pt had episode of v-tach, v-fib, code blue initiated, due to hypotension, placed on pressors and PCCM consulted for assistance.   Assessment & Plan: Acute hypoxic respiratory failure from PNA - respiratory status stable this AM - keep on oxygen via [place] to maintain O2 sat > 92% - continue to use pulmicort and albuterol  - ABX changed to Maxipime 9/3, continue same regimen fo now   Chronic hypotension >> normal SBP 80's to 90's. - Continue IV fluids, midodrine, solucortef added  - has been off pressors, PCCM cleared pt for transfer to tele unit - no need for pressors this AM  Sepsis secondary to UTI, RLL and LLL PNA, unknown pathogen - started on vanc and aztreonam, ABX changed to Maxipime 9/3 and will continue same regimen  - follow up on blood and urine, sputum cultures which are so far negative  - UA suggestive of UTI but could be chronic colonization - plan to change ABX to PO in AM if BP remains stable and deterioration on tele unit   Acute metabolic encephalopathy - secondary to the above, sepsis and hypotension - now resolved, pt is clinically  improving and as noted above has been off pressors foe 8+ hours - will transfer to tele and if remains stable with no clinical deterioration, can possible go home in am  Neurogenic bladder with chronic foley  Hypokalemia. - supplemented and WNL this AM  Severe protein calorie malnutrition. - advanced diet, tolerating well  - appreciate nutritionist team following  Stage IV sacral wound >> present prior to admission. - wound care consulted  - right ischium, chronic stage 4 pressure injury, pressure ulcer  - Left upper buttocks 4 cm x 3 cm x 0.5 cm  - Coccyx 3 cm x 3 cm x 0.3 cm  - Right ischium 4 cm x 2.4 cm x 2 cm wound bed 100% slough - WOC recommends to cleanse wounds to sacrum, coccyx with NS and pat gently dry.  Apply Aquacel Ag to wound bed. Cover with ABD pad and tape.  Change daily, appreciate recommendations   C spine injury with quadriplegia - continue Baclofen, duloxetine, gabapentin - ok to use Prn oxycodone, tizanidine  Underweight  - Body mass index is 17.01 kg/m.  DVT prophylaxis: Lovenox Sq Code Status: Full  Family Communication: Patient at bedside  Disposition Plan: transfer to tele and plan on d/c in 1-2 days based on clinical stability off pressors  Consultants:   PCCM  Procedures:   Code blue 9/2 - required defibrillation   Antimicrobials:   Aztreonam 9/2 --> 9/3  Vancomycin 9/2 --> 9/3  Maxipime 9/3 -->  Subjective: Still on pressors. No concerns this AM.   Objective: Vitals:   09/26/15 0600 09/26/15 0741 09/26/15 0800 09/26/15 1000  BP: (!) 136/93  126/84 (!) 143/88  Pulse:      Resp: 11  12 14   Temp:  98.2 F (36.8 C)    TempSrc:  Oral    SpO2: 95%  95% 93%  Weight:      Height:        Intake/Output Summary (Last 24 hours) at 09/26/15 1304 Last data filed at 09/26/15 1100  Gross per 24 hour  Intake          3610.18 ml  Output             1005 ml  Net          2605.18 ml   Filed Weights   09/22/15 1334 09/23/15 2326    Weight: 42.2 kg (93 lb) 50.5 kg (111 lb 5.3 oz)    Examination:  General exam: Appears calm and comfortable  Respiratory system: Clear to auscultation. Respiratory effort normal. Cardiovascular system: S1 & S2 heard, RRR. No JVD, murmurs, rubs, gallops or clicks. No pedal edema. Gastrointestinal system: Abdomen is nondistended, soft and nontender. No organomegaly or masses felt.   Data Reviewed: I have personally reviewed following labs and imaging studies  CBC:  Recent Labs Lab 09/22/15 1330 09/22/15 2018 09/23/15 0407 09/24/15 0819 09/25/15 0346 09/26/15 0327  WBC 18.9* 17.9* 18.4* 18.6* 13.2* 10.7*  NEUTROABS 15.0* 14.9*  --   --   --   --   HGB 12.2 13.4 12.2 11.9* 12.0 11.7*  HCT 37.4 42.5 37.8 40.0 39.5 37.5  MCV 88.8 91.4 90.0 95.5 93.2 90.4  PLT 364 397 383 335 296 [id number]   Basic Metabolic Panel:  Recent Labs Lab 09/23/15 0407 09/23/15 0627 09/24/15 0819 09/25/15 0346 09/26/15 0327  NA 139 139 138 142 136  K 3.4* 3.0* 3.5 3.6 3.5  CL 109 109 112* 111 109  CO2 24 24 23 27 22   GLUCOSE 80 82 109* 151* 122*  BUN 21* 20 14 11 14   CREATININE 0.50 0.40* 0.43* 0.37* 0.31*  CALCIUM 8.5* 8.4* 8.5* 8.6* 8.4*   Liver Function Tests:  Recent Labs Lab 09/22/15 1330 09/22/15 2018 09/26/15 0327  AST 17 16 18   ALT 10* 10* 11*  ALKPHOS 133* 140* 98  BILITOT 0.4 0.7 0.6  PROT 6.4* 6.5 4.9*  ALBUMIN 2.5* 2.5* 1.7*   Coagulation Profile:  Recent Labs Lab 09/22/15 2018  INR 0.95   Urine analysis:    Component Value Date/Time   COLORURINE YELLOW 09/22/2015 1334   APPEARANCEUR CLOUDY (A) 09/22/2015 1334   LABSPEC 1.007 09/22/2015 1334   PHURINE 5.5 09/22/2015 1334   GLUCOSEU NEGATIVE 09/22/2015 1334   HGBUR MODERATE (A) 09/22/2015 1334   BILIRUBINUR NEGATIVE 09/22/2015 1334   KETONESUR NEGATIVE 09/22/2015 1334   PROTEINUR NEGATIVE 09/22/2015 1334   UROBILINOGEN 0.2 10/29/2014 1521   NITRITE POSITIVE (A) 09/22/2015 1334   LEUKOCYTESUR LARGE (A)  09/22/2015 1334     Radiology Studies: Dg Chest Port 1 View Result Date: 09/22/2015 Atelectasis of RIGHT lower lobe with mild infiltrate versus atelectasis in medial LEFT lower lobe.   Scheduled Meds: . baclofen  20 mg Oral TID  . budesonide  2 mL Inhalation BID  . ceFEPime (MAXIPIME) IV  1 g Intravenous Q8H  . DULoxetine  30 mg Oral BID  . feeding supplement (ENSURE ENLIVE)  237 mL Oral BID BM  .  gabapentin  400 mg Oral TID  . [START ON 09/27/2015] hydrocortisone  40 mg Oral Daily   Followed by  . [START ON 09/28/2015] hydrocortisone  30 mg Oral Daily   Followed by  . [START ON 09/29/2015] hydrocortisone  20 mg Oral Daily   Followed by  . [START ON 09/30/2015] hydrocortisone  10 mg Oral Daily  . mouth rinse  15 mL Mouth Rinse BID  . midodrine  10 mg Oral TID WC  . multivitamin with minerals  1 tablet Oral Daily  . nicotine  21 mg Transdermal Daily   Continuous Infusions: . sodium chloride 100 mL/hr at 09/26/15 0600     LOS: 4 days    Time spent: 20 minutes    Faye Ramsay, MD [facility] Pager [phone]  If 7PM-7AM, please contact night-coverage [email or web address] Password [email or web address] 09/26/2015, 1:04 PM

## 2015-09-26 NOTE — Care Management Note (Signed)
Case Management Note  Patient Details  Name: Angela Page MRN: [id number] Date of Birth: [birth year]  Subjective/Objective:           Sepsis and hypotension         Action/Plan:  tbd   Expected Discharge Date:    [id number]           Expected Discharge Plan:   snf  In-House Referral:     Discharge planning Services     Post Acute Care Choice:    Choice offered to:     DME Arranged:    DME Agency:     HH Arranged:    HH Agency:     Status of Service:     If discussed at [employer], dates discussed:    Additional Comments:  Leeroy Cha, RN 09/26/2015, 10:04 AM

## 2015-09-26 NOTE — Consult Note (Signed)
[facility] Nurse wound consult note Reason for Consult: Follow up on pressure ulcers to sacrum, coccyx, bilateral ischial wounds Wound type: Pressure Pressure Ulcer POA: Yes Measurement: Not taken today  Assisted with bedside RN to perform dressing changes by providing turning and positioning assistance.  Previously ordered products in use.  No odor detected.  Dakin's solution at bedside.  Wound beds improving.  Patient tolerated well.  Discussed POC with patient and bedside nurse.  Re consult if needed, will not follow at this time. Thanks Val Riles MSN, RN, CNS-BC, [employer]

## 2015-09-27 LAB — CULTURE, BLOOD (ROUTINE X 2): Culture: NO GROWTH

## 2015-09-27 MED ORDER — HYDROCORTISONE 10 MG PO TABS: ORAL_TABLET | ORAL | 0 refills | Status: DC

## 2015-09-27 MED ORDER — LEVOFLOXACIN 500 MG PO TABS: 500.0000 mg | ORAL_TABLET | Freq: Every day | ORAL | 0 refills | Status: DC

## 2015-09-27 MED ORDER — LEVOFLOXACIN 500 MG PO TABS
500.0000 mg | ORAL_TABLET | Freq: Every day | ORAL | Status: DC
  Administered 2015-09-27: 500 mg via ORAL
  Filled 2015-09-27: qty 1

## 2015-09-27 MED ORDER — MIDODRINE HCL 10 MG PO TABS: 10.0000 mg | ORAL_TABLET | Freq: Three times a day (TID) | ORAL | 0 refills | Status: DC

## 2015-09-27 NOTE — Progress Notes (Signed)
CM received request from RN for PTAR arrangement.  CM arranged for PTAR; not O2 dependent, pt's mother will receive pt home.  Pt states she is with [facility] and no new orders are needed.  No other CM needs were communicated.

## 2015-09-27 NOTE — Discharge Instructions (Signed)

## 2015-09-27 NOTE — Discharge Summary (Signed)
Physician Discharge Summary  Angela Page [id number] DOB: [birth year] DOA: 09/22/2015  PCP: PROVIDER NOT IN SYSTEM  Admit date: 09/22/2015 Discharge date: 09/27/2015  Recommendations for Outpatient Follow-up:  1. Pt will need to follow up with PCP in 2-3 weeks post discharge 2. Please obtain BMP to evaluate electrolytes and kidney function 3. 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 Pt was unable to provide history and mother at bedside explained that pt has been more lethargic with poor oral intake. No specific symptoms reported like chest pain or dyspnea, no abd or urinary concerns.   ED Course:Pt initially unresponsive but responded to Narcan, initial blood pressure low 74/52 requiring 2 L IVF. Imaging studies notable for RLL and ? LLL PNA. Pt started on Vanc and Aztreonam and TRH asked to admit to SDU for further evaluation. Upon arrival to the SDU, pt had episode of v-tach, v-fib, code blue initiated, due to hypotension, placed on pressors and PCCM consulted for assistance.   Assessment & Plan: Acute hypoxic respiratory failure from PNA - respiratory status stable this AM - ABX changed to Maxipime 9/3, ABX coverage changed to Levaquin on discharge to complete therapy   Chronic hypotension >> normal SBP 80's to 90's. - pt provided script for solucortef to complete taper  - has been off pressors, PCCM has signed off   Sepsis secondary to UTI, RLL and LLL PNA, unknown pathogen - started on vanc and aztreonam, ABX changed to Maxipime 9/3 and no transitioned to oral Levaquin to complete therapy for one more day post  discharge  - blood and urine, sputum cultures which are so far negative  - UA suggestive of UTI but could be chronic colonization  Acute metabolic encephalopathy - secondary to the above, sepsis and hypotension - resolved   Neurogenic bladder with chronic foley  Hypokalemia. - supplemented prior to discharge   Severe protein calorie malnutrition. - advanced diet, tolerating well   Stage IV sacral wound >> present prior to admission. - wound care consulted  - right ischium, chronic stage 4 pressure injury, pressure ulcer  - Left upper buttocks 4 cm x 3 cm x 0.5 cm  - Coccyx 3 cm x 3 cm x 0.3 cm  - Right ischium 4 cm x 2.4 cm x 2 cm wound bed 100% slough - WOC recommends to cleanse wounds to sacrum, coccyx with NS and pat gently dry. Apply Aquacel Ag to wound bed. Cover with ABD pad and tape. Change daily, appreciate recommendations   C spine injury with quadriplegia - continue Baclofen, duloxetine, gabapentin  Underweight  - Body mass index is 17.01 kg/m.  DVT prophylaxis: Lovenox Sq Code Status: Full  Family Communication: Patient at bedside  Disposition Plan: home   Consultants:   PCCM  Procedures:   Code blue 9/2 - required defibrillation   Antimicrobials:   Aztreonam 9/2 --> 9/3  Vancomycin 9/2 --> 9/3  Maxipime 9/3 --> changed to [place] 9/7 to complete therapy until 9/8   Procedures/Studies: Dg Chest Port 1 View  Result Date: 09/24/2015 CLINICAL DATA:  Healthcare associated pneumonia EXAM: PORTABLE CHEST 1 VIEW COMPARISON:  09/22/2015 FINDINGS: Right lower lobe atelectasis/volume loss.  Underlying infection is not excluded. Small right pleural effusion. Left lower lobe patchy opacity, atelectasis versus pneumonia. No pneumothorax. The heart is normal in size.  Left subclavian pacemaker. Cervical spine fixation hardware, incompletely visualized. IMPRESSION: Right lower lobe atelectasis/ volume loss, underlying infection not excluded. Small right  pleural effusion. Left lower lobe patchy opacity, atelectasis versus pneumonia.a Electronically Signed   By: Julian Hy M.D.   On: 09/24/2015 07:25   Dg Chest Port 1 View  Result Date: 09/22/2015 CLINICAL DATA:  Lethargy, no bowel movement for 3 days, abdominal distension, low urine output, shallow respirations, asthma, former smoker EXAM: PORTABLE CHEST 1 VIEW COMPARISON:  Portable exam 1340 hours compared to 05/07/2015 FINDINGS: LEFT subclavian transvenous pacemaker leads project over RIGHT atrium and RIGHT ventricle, unchanged. Normal heart size, mediastinal contours and pulmonary vascularity. RIGHT lower lobe atelectasis. Mild subsegmental atelectasis versus infiltrate at medial LEFT lower lobe. Upper lungs clear. No pleural effusion or pneumothorax. IMPRESSION: Atelectasis of RIGHT lower lobe with mild infiltrate versus atelectasis in medial LEFT lower lobe. Electronically Signed   By: Lavonia Dana M.D.   On: 09/22/2015 14:17     Discharge Exam: Vitals:   09/27/15 0548 09/27/15 1020  BP: (!) 127/91   Pulse: 79 80  Resp: 16 16  Temp: 97.5 F (36.4 C)    Vitals:   09/26/15 1920 09/26/15 2047 09/27/15 0548 09/27/15 1020  BP:  (!) 150/96 (!) 127/91   Pulse:  81 79 80  Resp:  16 16 16   Temp:  98.8 F (37.1 C) 97.5 F (36.4 C)   TempSrc:  Oral Oral   SpO2: 95% 97% 98% 97%  Weight:      Height:        General: Pt is alert, follows commands appropriately, not in acute distress Cardiovascular: Regular rate and rhythm, S1/S2 +, no murmurs, no rubs, no gallops Respiratory: Clear to auscultation bilaterally, no wheezing, no crackles, no rhonchi Abdominal: Soft, non tender, non distended, bowel sounds +, no guarding  Discharge Instructions  Discharge Instructions    Diet - low sodium heart healthy    Complete by:  As directed   Diet - low sodium heart healthy    Complete by:  As directed   Increase activity slowly    Complete by:  As directed   Increase activity slowly     Complete by:  As directed       Medication List    TAKE these medications   acetaminophen 500 MG tablet Commonly known as:  TYLENOL Take 1,000 mg by mouth every 6 (six) hours as needed for moderate pain.   albuterol 108 (90 Base) MCG/ACT inhaler Commonly known as:  PROVENTIL HFA;VENTOLIN HFA Inhale 2 puffs into the lungs every 6 (six) hours as needed. For shortness of breath   baclofen 20 MG tablet Commonly known as:  LIORESAL Take 20 mg by mouth 3 (three) times daily.   [employer] VITAMIN B12 1000 MCG tablet Generic drug:  cyanocobalamin Take 1 tablet by mouth daily.   dextromethorphan-guaiFENesin 30-600 MG 12hr tablet Commonly known as:  MUCINEX DM Take 1 tablet by mouth 2 (two) times daily. What changed:  when to take this  reasons to take this   DULoxetine 30 MG capsule Commonly known as:  CYMBALTA Take 30 mg by mouth 2 (two) times daily.   ENSURE Take 237 mLs by mouth 2 (two) times daily.   ferrous sulfate 325 (65 FE) MG tablet Take 1 tablet (325 mg total) by mouth  3 (three) times daily.   gabapentin 400 MG capsule Commonly known as:  NEURONTIN Take 400 mg by mouth 3 (three) times daily.   hydrocortisone 10 MG tablet Commonly known as:  CORTEF Take 30 mg tablet on Sept 8th, 2017 and taper down by 10 mg daily until completed   ibuprofen 200 MG tablet Commonly known as:  ADVIL,MOTRIN Take 600 mg by mouth every 6 (six) hours as needed for moderate pain.   levofloxacin 500 MG tablet Commonly known as:  LEVAQUIN Take 1 tablet (500 mg total) by mouth daily. Take tablet on Sept 8th, 2017 Start taking on:  09/28/2015   lidocaine 5 % Commonly known as:  LIDODERM Place 1 patch onto the skin 2 (two) times daily as needed (pain).   midodrine 10 MG tablet Commonly known as:  PROAMATINE Take 1 tablet (10 mg total) by mouth 3 (three) times daily with meals. What changed:  medication strength  how much to take   oxycodone 5 MG capsule Commonly known as:   OXY-IR Take 5 mg by mouth every 4 (four) hours as needed for pain.   QVAR 40 MCG/ACT inhaler Generic drug:  beclomethasone Inhale 2 puffs into the lungs 2 (two) times daily.   silver sulfADIAZINE 1 % cream Commonly known as:  SILVADENE Apply 1 application topically every other day as needed (for wound care).   tiZANidine 4 MG tablet Commonly known as:  ZANAFLEX Take 4 mg by mouth every 8 (eight) hours as needed. Muscle spasms.   zinc oxide 11.3 % Crea cream Commonly known as:  BALMEX Apply 1 application topically 3 (three) times daily as needed (for skin redness/irritation).      Follow-up Information    Faye Ramsay, MD .   Specialty:  Internal Medicine Contact information: [address] [place] [address] [phone]            The results of significant diagnostics from this hospitalization (including imaging, microbiology, ancillary and laboratory) are listed below for reference.     Microbiology: Recent Results (from the past 240 hour(s))  Urine culture     Status: Abnormal   Collection Time: 09/22/15 12:41 AM  Result Value Ref Range Status   Specimen Description URINE, CATHETERIZED  Final   Special Requests NONE  Final   Culture 80,000 COLONIES/mL YEAST (A)  Final   Report Status 09/24/2015 FINAL  Final  Urine culture     Status: Abnormal   Collection Time: 09/22/15  1:34 PM  Result Value Ref Range Status   Specimen Description URINE, RANDOM  Final   Special Requests NONE  Final   Culture MULTIPLE SPECIES PRESENT, SUGGEST RECOLLECTION (A)  Final   Report Status 09/24/2015 FINAL  Final  Culture, blood (Routine x 2)     Status: Abnormal   Collection Time: 09/22/15  1:40 PM  Result Value Ref Range Status   Specimen Description BLOOD LEFT ARM  3 ML IN [facility] BOTTLE  Final   Special Requests NONE  Final   Culture  Setup Time   Final    GRAM POSITIVE COCCI IN CLUSTERS IN BOTH AEROBIC AND ANAEROBIC BOTTLES CRITICAL RESULT CALLED  TO, READ BACK BY AND VERIFIED WITH: M LILLISTON,PHARMD AT 1418 09/23/15 BY L BENFIELD    Culture (A)  Final    STAPHYLOCOCCUS SPECIES (COAGULASE NEGATIVE) THE SIGNIFICANCE OF ISOLATING THIS ORGANISM FROM A SINGLE SET OF BLOOD CULTURES WHEN MULTIPLE SETS ARE DRAWN IS UNCERTAIN. PLEASE NOTIFY THE MICROBIOLOGY DEPARTMENT WITHIN  ONE WEEK IF SPECIATION AND SENSITIVITIES ARE REQUIRED. Performed at [facility]    Report Status 09/25/2015 FINAL  Final  Blood Culture ID Panel (Reflexed)     Status: Abnormal   Collection Time: 09/22/15  1:40 PM  Result Value Ref Range Status   Enterococcus species NOT DETECTED NOT DETECTED Final   Listeria monocytogenes NOT DETECTED NOT DETECTED Final   Staphylococcus species DETECTED (A) NOT DETECTED Final    Comment: CRITICAL RESULT CALLED TO, READ BACK BY AND VERIFIED WITH: LILLISTON,PHARMD AT 1418 09/23/15 BY L BENFIELD    Staphylococcus aureus NOT DETECTED NOT DETECTED Final   Methicillin resistance DETECTED (A) NOT DETECTED Final    Comment: CRITICAL RESULT CALLED TO, READ BACK BY AND VERIFIED WITH: LILLISTON,PHARMD AT 1418 09/23/15 BY L BENFIELD    Streptococcus species NOT DETECTED NOT DETECTED Final   Streptococcus agalactiae NOT DETECTED NOT DETECTED Final   Streptococcus pneumoniae NOT DETECTED NOT DETECTED Final   Streptococcus pyogenes NOT DETECTED NOT DETECTED Final   Acinetobacter baumannii NOT DETECTED NOT DETECTED Final   Enterobacteriaceae species NOT DETECTED NOT DETECTED Final   Enterobacter cloacae complex NOT DETECTED NOT DETECTED Final   Escherichia coli NOT DETECTED NOT DETECTED Final   Klebsiella oxytoca NOT DETECTED NOT DETECTED Final   Klebsiella pneumoniae NOT DETECTED NOT DETECTED Final   Proteus species NOT DETECTED NOT DETECTED Final   Serratia marcescens NOT DETECTED NOT DETECTED Final   Haemophilus influenzae NOT DETECTED NOT DETECTED Final   Neisseria meningitidis NOT DETECTED NOT DETECTED Final   Pseudomonas aeruginosa  NOT DETECTED NOT DETECTED Final   Candida albicans NOT DETECTED NOT DETECTED Final   Candida glabrata NOT DETECTED NOT DETECTED Final   Candida krusei NOT DETECTED NOT DETECTED Final   Candida parapsilosis NOT DETECTED NOT DETECTED Final   Candida tropicalis NOT DETECTED NOT DETECTED Final    Comment: Performed at [facility]  Culture, blood (Routine x 2)     Status: None (Preliminary result)   Collection Time: 09/22/15  2:05 PM  Result Value Ref Range Status   Specimen Description BLOOD LEFT HAND  2 ML IN YELLOW  Final   Special Requests NONE  Final   Culture   Final    NO GROWTH 4 DAYS Performed at [facility]    Report Status PENDING  Incomplete  MRSA PCR Screening     Status: None   Collection Time: 09/23/15  6:04 AM  Result Value Ref Range Status   MRSA by PCR NEGATIVE NEGATIVE Final    Comment:        The GeneXpert MRSA Assay (FDA approved for NASAL specimens only), is one component of a comprehensive MRSA colonization surveillance program. It is not intended to diagnose MRSA infection nor to guide or monitor treatment for MRSA infections.   Culture, Urine     Status: Abnormal   Collection Time: 09/25/15  9:57 AM  Result Value Ref Range Status   Specimen Description URINE, CATHETERIZED  Final   Special Requests NONE  Final   Culture >=100,000 COLONIES/mL YEAST (A)  Final   Report Status 09/26/2015 FINAL  Final     Labs: Basic Metabolic Panel:  Recent Labs Lab 09/23/15 0407 09/23/15 0627 09/24/15 0819 09/25/15 0346 09/26/15 0327  NA 139 139 138 142 136  K 3.4* 3.0* 3.5 3.6 3.5  CL 109 109 112* 111 109  CO2 24 24 23 27 22   GLUCOSE 80 82 109* 151* 122*  BUN 21* 20  14 11 14   CREATININE 0.50 0.40* 0.43* 0.37* 0.31*  CALCIUM 8.5* 8.4* 8.5* 8.6* 8.4*   Liver Function Tests:  Recent Labs Lab 09/22/15 1330 09/22/15 2018 09/26/15 0327  AST 17 16 18   ALT 10* 10* 11*  ALKPHOS 133* 140* 98  BILITOT 0.4 0.7 0.6  PROT 6.4* 6.5 4.9*   ALBUMIN 2.5* 2.5* 1.7*   No results for input(s): LIPASE, AMYLASE in the last 168 hours. No results for input(s): AMMONIA in the last 168 hours. CBC:  Recent Labs Lab 09/22/15 1330 09/22/15 2018 09/23/15 0407 09/24/15 0819 09/25/15 0346 09/26/15 0327  WBC 18.9* 17.9* 18.4* 18.6* 13.2* 10.7*  NEUTROABS 15.0* 14.9*  --   --   --   --   HGB 12.2 13.4 12.2 11.9* 12.0 11.7*  HCT 37.4 42.5 37.8 40.0 39.5 37.5  MCV 88.8 91.4 90.0 95.5 93.2 90.4  PLT 364 397 383 335 296 298   SIGNED: Time coordinating discharge: 30 minutes  MAGICK-Luceal Hollibaugh, MD  [facility] 09/27/2015, 10:39 AM Pager [phone]  If 7PM-7AM, please contact night-coverage [email or web address] Password [email or web address]

## 2015-10-08 DIAGNOSIS — Z8701 Personal history of pneumonia (recurrent): Secondary | ICD-10-CM | POA: Insufficient documentation

## 2015-10-20 ENCOUNTER — Encounter (HOSPITAL_COMMUNITY): Payer: Self-pay | Admitting: Emergency Medicine

## 2015-10-20 ENCOUNTER — Emergency Department (HOSPITAL_COMMUNITY)
Admission: EM | Admit: 2015-10-20 | Discharge: 2015-10-20 | Disposition: A | Discharge status: Home / Self Care | Payer: Medicaid Other | Attending: Emergency Medicine | Admitting: Emergency Medicine

## 2015-10-20 DIAGNOSIS — Z87891 Personal history of nicotine dependence: Secondary | ICD-10-CM | POA: Insufficient documentation

## 2015-10-20 DIAGNOSIS — N390 Urinary tract infection, site not specified: Secondary | ICD-10-CM | POA: Diagnosis not present

## 2015-10-20 DIAGNOSIS — Z950 Presence of cardiac pacemaker: Secondary | ICD-10-CM | POA: Insufficient documentation

## 2015-10-20 DIAGNOSIS — J45909 Unspecified asthma, uncomplicated: Secondary | ICD-10-CM | POA: Diagnosis not present

## 2015-10-20 LAB — URINALYSIS, ROUTINE W REFLEX MICROSCOPIC
Bilirubin Urine: NEGATIVE
Glucose, UA: NEGATIVE mg/dL
Ketones, ur: NEGATIVE mg/dL
NITRITE: NEGATIVE
PROTEIN: 30 mg/dL — AB
SPECIFIC GRAVITY, URINE: 1.014 (ref 1.005–1.030)
pH: 6.5 (ref 5.0–8.0)

## 2015-10-20 LAB — CBC WITH DIFFERENTIAL/PLATELET
BASOS PCT: 0 %
Basophils Absolute: 0 10*3/uL (ref 0.0–0.1)
EOS ABS: 0.3 10*3/uL (ref 0.0–0.7)
Eosinophils Relative: 3 %
HCT: 40.6 % (ref 36.0–46.0)
HEMOGLOBIN: 13.3 g/dL (ref 12.0–15.0)
LYMPHS ABS: 2.7 10*3/uL (ref 0.7–4.0)
Lymphocytes Relative: 24 %
MCH: 29.4 pg (ref 26.0–34.0)
MCHC: 32.8 g/dL (ref 30.0–36.0)
MCV: 89.6 fL (ref 78.0–100.0)
MONO ABS: 1.2 10*3/uL — AB (ref 0.1–1.0)
MONOS PCT: 10 %
NEUTROS ABS: 7 10*3/uL (ref 1.7–7.7)
NEUTROS PCT: 63 %
Platelets: 501 10*3/uL — ABNORMAL HIGH (ref 150–400)
RBC: 4.53 MIL/uL (ref 3.87–5.11)
RDW: 17.8 % — ABNORMAL HIGH (ref 11.5–15.5)
WBC: 11.3 10*3/uL — ABNORMAL HIGH (ref 4.0–10.5)

## 2015-10-20 LAB — COMPREHENSIVE METABOLIC PANEL
ALBUMIN: 2.5 g/dL — AB (ref 3.5–5.0)
ALK PHOS: 125 U/L (ref 38–126)
ALT: 9 U/L — AB (ref 14–54)
AST: 14 U/L — ABNORMAL LOW (ref 15–41)
Anion gap: 9 (ref 5–15)
BUN: 13 mg/dL (ref 6–20)
CALCIUM: 9.1 mg/dL (ref 8.9–10.3)
CHLORIDE: 101 mmol/L (ref 101–111)
CO2: 28 mmol/L (ref 22–32)
CREATININE: 0.53 mg/dL (ref 0.44–1.00)
GFR calc non Af Amer: >60 mL/min (ref >60)
GLUCOSE: 81 mg/dL (ref 65–99)
Potassium: 3.7 mmol/L (ref 3.5–5.1)
SODIUM: 138 mmol/L (ref 135–145)
Total Bilirubin: 0.6 mg/dL (ref 0.3–1.2)
Total Protein: 6.7 g/dL (ref 6.5–8.1)

## 2015-10-20 LAB — URINE MICROSCOPIC-ADD ON

## 2015-10-20 LAB — I-STAT CG4 LACTIC ACID, ED: LACTIC ACID, VENOUS: 1.76 mmol/L (ref 0.5–1.9)

## 2015-10-20 LAB — I-STAT CHEM 8, ED
BUN: 13 mg/dL (ref 6–20)
CHLORIDE: 100 mmol/L — AB (ref 101–111)
Calcium, Ion: 1.14 mmol/L — ABNORMAL LOW (ref 1.15–1.40)
Creatinine, Ser: 0.7 mg/dL (ref 0.44–1.00)
Glucose, Bld: 78 mg/dL (ref 65–99)
HCT: 43 % (ref 36.0–46.0)
Hemoglobin: 14.6 g/dL (ref 12.0–15.0)
POTASSIUM: 3.7 mmol/L (ref 3.5–5.1)
SODIUM: 139 mmol/L (ref 135–145)
TCO2: 29 mmol/L (ref 0–100)

## 2015-10-20 MED ORDER — ONDANSETRON HCL 4 MG/2ML IJ SOLN
4.0000 mg | Freq: Once | INTRAMUSCULAR | Status: AC
  Administered 2015-10-20: 4 mg via INTRAVENOUS
  Filled 2015-10-20: qty 2

## 2015-10-20 MED ORDER — CIPROFLOXACIN HCL 500 MG PO TABS: 500.0000 mg | ORAL_TABLET | Freq: Two times a day (BID) | ORAL | 0 refills | Status: DC

## 2015-10-20 MED ORDER — SODIUM CHLORIDE 0.9 % IV BOLUS (SEPSIS)
1000.0000 mL | Freq: Once | INTRAVENOUS | Status: AC
  Administered 2015-10-20: 1000 mL via INTRAVENOUS

## 2015-10-20 MED ORDER — SODIUM CHLORIDE 0.9 % IV SOLN
INTRAVENOUS | Status: DC
  Administered 2015-10-20: 15:00:00 via INTRAVENOUS

## 2015-10-20 MED ORDER — CIPROFLOXACIN IN D5W 400 MG/200ML IV SOLN
400.0000 mg | Freq: Once | INTRAVENOUS | Status: AC
  Administered 2015-10-20: 400 mg via INTRAVENOUS
  Filled 2015-10-20: qty 200

## 2015-10-20 NOTE — ED Provider Notes (Signed)
[facility] DEPT Provider Note   CSN: [id number] Arrival date & time: 10/20/15  1352     History   Chief Complaint Chief Complaint  Patient presents with  . possible UTI    HPI Angela Page is a 45 y.o. female.  The patient is a long-standing quadriplegic. Patient is concerned that she has a recurrent urinary tract infection because her urine has been dark and has strong smell. Patient with recent admission for respiratory failure and cardiac arrest and sepsis. Based on review of those documents pressure is normally in the 80s and 90s is baseline for her. Patient states she's not concerned about any pneumonia or respiratory infections. Just concerned about the urine she feels fine does not feel as if she's getting sick.      Past Medical History:  Diagnosis Date  . Asthma   . GERD (gastroesophageal reflux disease)   . Pacemaker   . Pleurisy   . Quadriparesis [facility])     Patient Active Problem List   Diagnosis Date Noted  . Pressure ulcer 02/26/2015  . HCAP (healthcare-associated pneumonia) 02/25/2015  . UTI (lower urinary tract infection) 02/25/2015  . Hypotension 02/25/2015  . Anemia 02/25/2015  . Enterococcus UTI 02/03/2015  . Sepsis ([place]) 01/31/2015  . RLL pneumonia 01/31/2015  . Altered mental status 10/29/2014  . Encephalopathy, toxic 10/29/2014  . Toxic metabolic encephalopathy [id number]  . Urinary tract infection due to Proteus 10/24/2014  . Leukocytosis 10/23/2014  . Hypokalemia 10/23/2014  . Pressure ulcer of sacral region, stage 4 ([place]) 10/23/2014  . Anemia, iron deficiency 10/23/2014  . Severe sepsis ([place])   . Septic shock ([place]) 10/22/2014  . Quadriplegia, C5-C7 incomplete ([place]) 10/22/2014  . Depression   . Protein-calorie malnutrition, severe ([place]) 09/05/2014  . Overdose of opiate or related narcotic 09/02/2014    Past Surgical History:  Procedure Laterality Date  . APPENDECTOMY    . CARDIAC SURGERY    . I&D EXTREMITY  10/28/2011   Procedure: IRRIGATION AND DEBRIDEMENT EXTREMITY;  Surgeon: Tennis Must, MD;  Location: [place];  Service: Orthopedics;  Laterality: Right;  Irrigation and debridement right middle finger    OB History    No data available       Home Medications    Prior to Admission medications   Medication Sig Start Date End Date Taking? Authorizing Provider  acetaminophen (TYLENOL) 500 MG tablet Take 1,000 mg by mouth every 6 (six) hours as needed for moderate pain.   Yes Historical Provider, MD  baclofen (LIORESAL) 20 MG tablet Take 20 mg by mouth 3 (three) times daily.   Yes Historical Provider, MD  [employer] VITAMIN B12 1000 MCG tablet Take 1 tablet by mouth daily. 08/22/14  Yes Historical Provider, MD  dextromethorphan-guaiFENesin (MUCINEX DM) 30-600 MG 12hr tablet Take 1 tablet by mouth 2 (two) times daily. Patient taking differently: Take 1 tablet by mouth 2 (two) times daily as needed for cough.  03/01/15  Yes Debbe Odea, MD  DULoxetine (CYMBALTA) 30 MG capsule Take 30 mg by mouth 2 (two) times daily. 01/06/15  Yes Historical Provider, MD  ENSURE (ENSURE) Take 237 mLs by mouth 2 (two) times daily.   Yes Historical Provider, MD  ferrous sulfate 325 (65 FE) MG tablet Take 1 tablet (325 mg total) by mouth 3 (three) times daily. 08/04/14  Yes Janece Canterbury, MD  gabapentin (NEURONTIN) 400 MG capsule Take 400 mg by mouth 3 (three) times daily. 10/18/14  Yes Historical Provider, MD  ibuprofen (ADVIL,MOTRIN)  200 MG tablet Take 400 mg by mouth every 6 (six) hours as needed for moderate pain.    Yes Historical Provider, MD  lidocaine (LIDODERM) 5 % Place 1 patch onto the skin 2 (two) times daily as needed (pain).  04/04/15  Yes Historical Provider, MD  Melatonin 3 MG TABS Take 6 mg by mouth at bedtime.  10/11/15  Yes Historical Provider, MD  midodrine (PROAMATINE) 10 MG tablet Take 1 tablet (10 mg total) by mouth 3 (three) times daily with meals. 09/27/15  Yes Theodis Blaze, MD  oxycodone (OXY-IR) 5  MG capsule Take 5 mg by mouth every 4 (four) hours as needed for pain.   Yes Historical Provider, MD  tiZANidine (ZANAFLEX) 4 MG tablet Take 4 mg by mouth every 8 (eight) hours as needed. Muscle spasms. 12/13/14  Yes Historical Provider, MD  zinc oxide (BALMEX) 11.3 % CREA cream Apply 1 application topically 3 (three) times daily as needed (for skin redness/irritation).   Yes Historical Provider, MD  albuterol (PROVENTIL HFA;VENTOLIN HFA) 108 (90 BASE) MCG/ACT inhaler Inhale 2 puffs into the lungs every 6 (six) hours as needed. For shortness of breath     Historical Provider, MD  ciprofloxacin (CIPRO) 500 MG tablet Take 1 tablet (500 mg total) by mouth 2 (two) times daily. 10/20/15   Fredia Sorrow, MD  hydrocortisone (CORTEF) 10 MG tablet Take 30 mg tablet on Sept 8th, 2017 and taper down by 10 mg daily until completed Patient not taking: Reported on 10/20/2015 09/27/15   Theodis Blaze, MD  levofloxacin (LEVAQUIN) 500 MG tablet Take 1 tablet (500 mg total) by mouth daily. Take tablet on Sept 8th, 2017 Patient not taking: Reported on 10/20/2015 09/28/15   Theodis Blaze, MD  silver sulfADIAZINE (SILVADENE) 1 % cream Apply 1 application topically every other day as needed (for wound care).    Historical Provider, MD    Family History Family History  Problem Relation Age of Onset  . Hypertension Maternal Uncle     Social History Social History  Substance Use Topics  . Smoking status: Former Smoker    Packs/day: 1.00    Years: 25.00    Types: Cigarettes  . Smokeless tobacco: Never Used  . Alcohol use Yes     Comment: 2 x weekly     Allergies   Erythromycin; Nitrofurantoin monohyd macro; and Penicillins   Review of Systems Review of Systems  Constitutional: Negative for fever.  HENT: Negative for congestion.   Eyes: Negative for visual disturbance.  Respiratory: Negative for cough and shortness of breath.   Cardiovascular: Negative for chest pain.  Gastrointestinal: Negative for  abdominal pain.  Skin: Negative for rash.  Neurological: Negative for headaches.  Hematological: Does not bruise/bleed easily.  Psychiatric/Behavioral: Negative for confusion.     Physical Exam Updated Vital Signs BP (!) 88/60 (BP Location: Right Arm)   Pulse 82   Temp 99.1 F (37.3 C) (Oral)   Resp 16   Ht 5\' 2"  (1.575 m)   Wt 43.1 kg   LMP  (LMP Unknown)   SpO2 93%   BMI 17.38 kg/m   Physical Exam  Constitutional: She appears well-developed and well-nourished. No distress.  HENT:  Head: Normocephalic and atraumatic.  Mouth/Throat: Oropharynx is clear and moist.  Eyes: Conjunctivae and EOM are normal. Pupils are equal, round, and reactive to light.  Neck: Neck supple.  Healed tracheostomy scar.  Cardiovascular: Normal rate, regular rhythm and normal heart sounds.   Pulmonary/Chest: Effort  normal and breath sounds normal.  Abdominal: Soft. Bowel sounds are normal.  Neurological: She is alert.  The patient is baseline quadriplegic.  Skin: Skin is warm.  Nursing note and vitals reviewed.    ED Treatments / Results  Labs (all labs ordered are listed, but only abnormal results are displayed) Labs Reviewed  URINALYSIS, ROUTINE W REFLEX MICROSCOPIC (NOT AT [facility]) - Abnormal; Notable for the following:       Result Value   APPearance CLOUDY (*)    Hgb urine dipstick LARGE (*)    Protein, ur 30 (*)    Leukocytes, UA LARGE (*)    All other components within normal limits  COMPREHENSIVE METABOLIC PANEL - Abnormal; Notable for the following:    Albumin 2.5 (*)    AST 14 (*)    ALT 9 (*)    All other components within normal limits  CBC WITH DIFFERENTIAL/PLATELET - Abnormal; Notable for the following:    WBC 11.3 (*)    RDW 17.8 (*)    Platelets 501 (*)    Monocytes Absolute 1.2 (*)    All other components within normal limits  URINE MICROSCOPIC-ADD ON - Abnormal; Notable for the following:    Squamous Epithelial / LPF 0-5 (*)    Bacteria, UA FEW (*)    All other  components within normal limits  I-STAT CHEM 8, ED - Abnormal; Notable for the following:    Chloride 100 (*)    Calcium, Ion 1.14 (*)    All other components within normal limits  URINE CULTURE  I-STAT CG4 LACTIC ACID, ED    EKG  EKG Interpretation  Date/Time:  Saturday October 20 2015 15:31:44 EDT Ventricular Rate:  80 PR Interval:    QRS Duration: 98 QT Interval:  433 QTC Calculation: 500 R Axis:   56 Text Interpretation:  Sinus rhythm RSR' in V1 or V2, probably normal variant Borderline prolonged QT interval Interpretation limited secondary to artifact Confirmed by Laruth Hanger  MD, Jadon Harbaugh [id number]) on 10/20/2015 3:52:18 PM       Radiology No results found.  Procedures Procedures (including critical care time)  Medications Ordered in ED Medications  0.9 %  sodium chloride infusion ( Intravenous New Bag/Given 10/20/15 1451)  sodium chloride 0.9 % bolus 1,000 mL (0 mLs Intravenous Stopped 10/20/15 1610)  ondansetron (ZOFRAN) injection 4 mg (4 mg Intravenous Given 10/20/15 1503)  ciprofloxacin (CIPRO) IVPB 400 mg (400 mg Intravenous New Bag/Given 10/20/15 1610)     Initial Impression / Assessment and Plan / ED Course  I have reviewed the triage vital signs and the nursing notes.  Pertinent labs & imaging results that were available during my care of the patient were reviewed by me and considered in my medical decision making (see chart for details).  Clinical Course    Patient with history of chronic hypotension. Blood pressure is normally in the 80s and 90s. Blood pressure currently upper 80s low 90s. No evidence of any sepsis. Not tachycardic lactic acid is normal patient nontoxic no acute distress. Urinalysis suggestive of urinary tract infection early. Patient had urine culture sent treated here with IV Cipro and be continued home on Cipro. It is noted that the Cipro can have some contraindication to one of her medications that she is currently on. The patient has an  allergy to penicillins and not able to take cephalosporins. Patient will return for any new or worse symptoms.  Final Clinical Impressions(s) / ED Diagnoses   Final diagnoses:  UTI (  lower urinary tract infection)    New Prescriptions New Prescriptions   CIPROFLOXACIN (CIPRO) 500 MG TABLET    Take 1 tablet (500 mg total) by mouth 2 (two) times daily.     Fredia Sorrow, MD 10/20/15 [phone]

## 2015-10-20 NOTE — ED Triage Notes (Signed)
Per [facility] pt from home reports recently admitted to hospital for pneumonia , reports blood in urine x 3 days. Possible febrile. Nausea this morning. Pt is quadriplegic. Presents with foley catheter. alert and oriented x 4.

## 2015-10-20 NOTE — ED Notes (Signed)
Called [facility] for transport.  

## 2015-10-20 NOTE — ED Notes (Signed)
Bed: WHALA Expected date:  Expected time:  Means of arrival:  Comments: 

## 2015-10-20 NOTE — ED Notes (Signed)
Bed: [id number] Expected date: 10/20/15 Expected time: 1:36 PM Means of arrival:  Comments: UTI

## 2015-10-20 NOTE — ED Notes (Signed)
[facility] Picked up patient.

## 2015-10-20 NOTE — Discharge Instructions (Signed)
Take the Cipro antibiotic as directed for the next 7 days. Urine culture is pending. Return for any new or worse symptoms. Make an appointment to follow-up with your doctor as needed.

## 2015-10-21 IMAGING — CR DG CHEST 2V
2 series · 2 of 2 positions shown · non-contrast
Comparison: 09/16/2013, 03/31/2013

CLINICAL DATA: Upper back and shoulder pain for several months

EXAM:
CHEST  2 VIEW

[w chest pa]
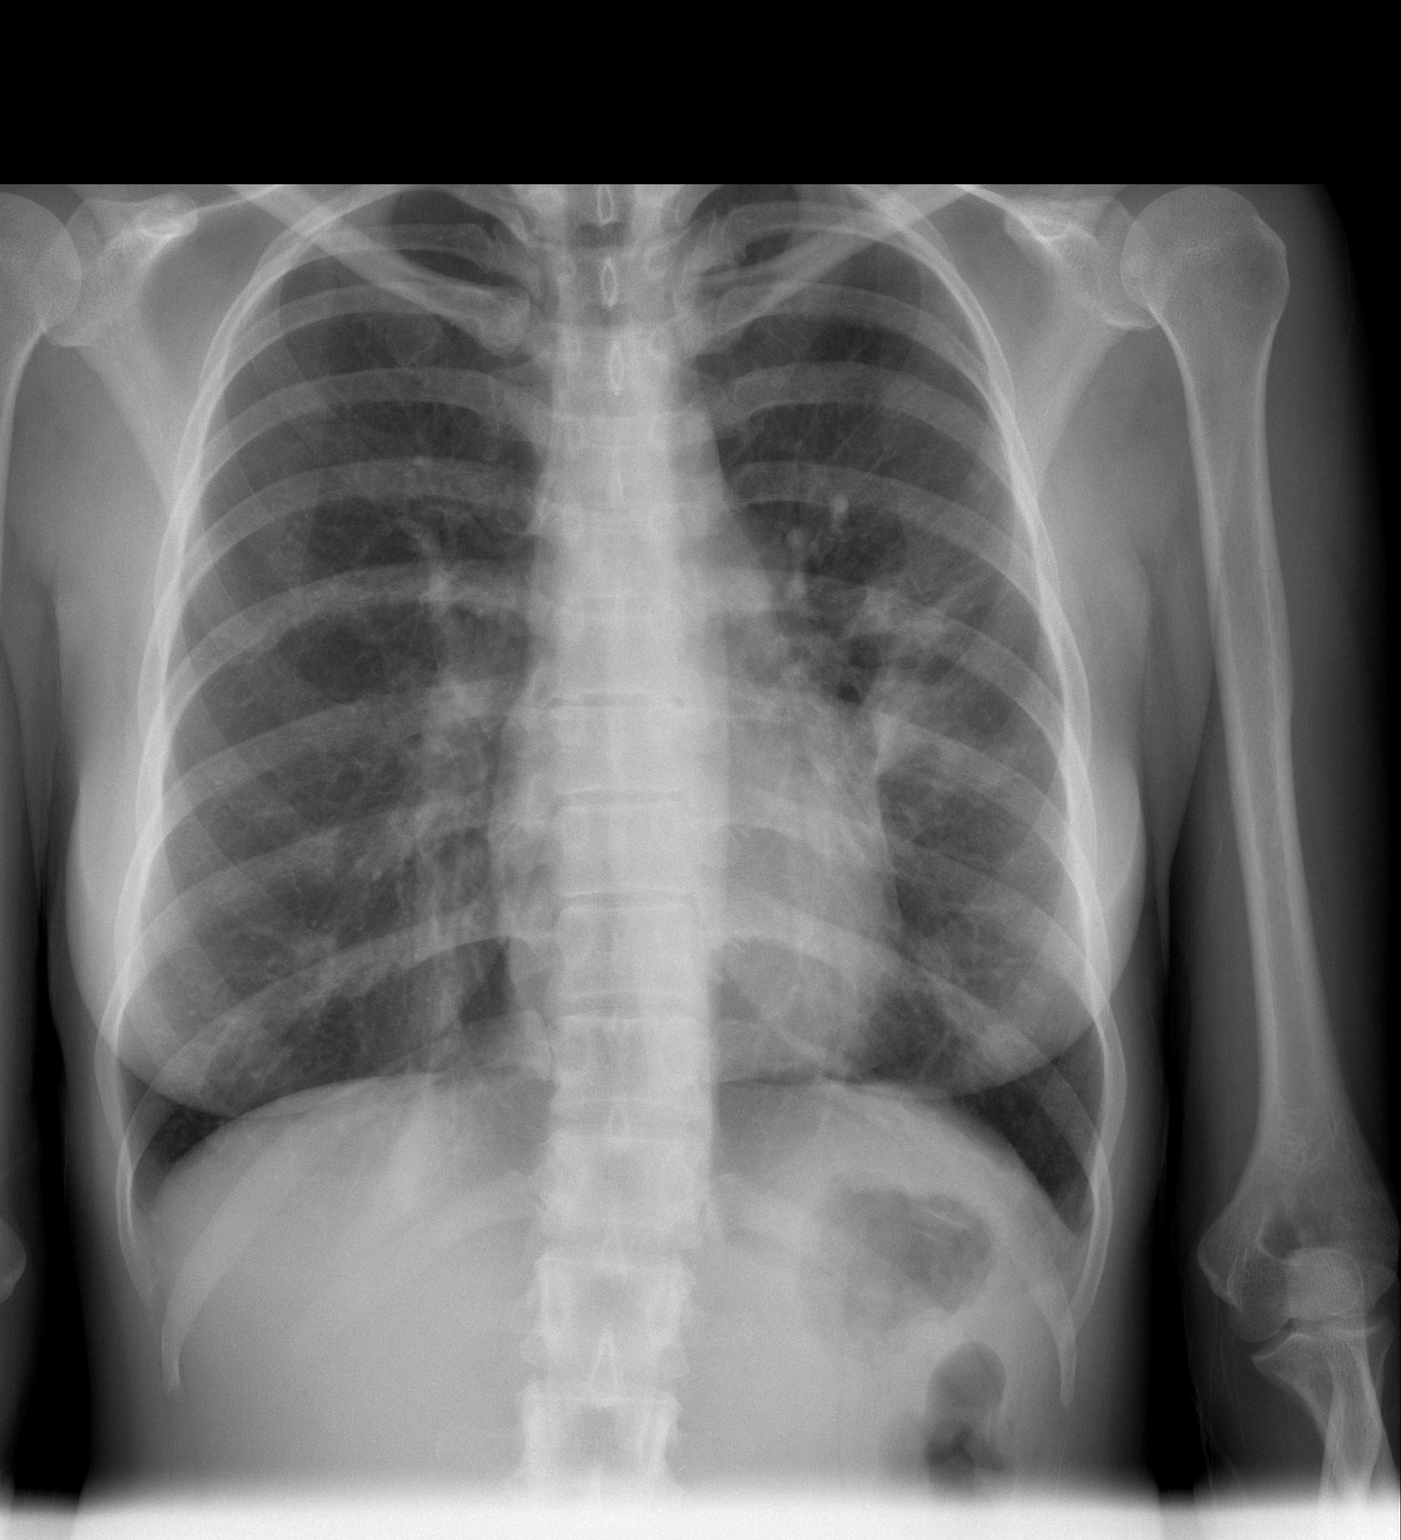

[w chest lat]
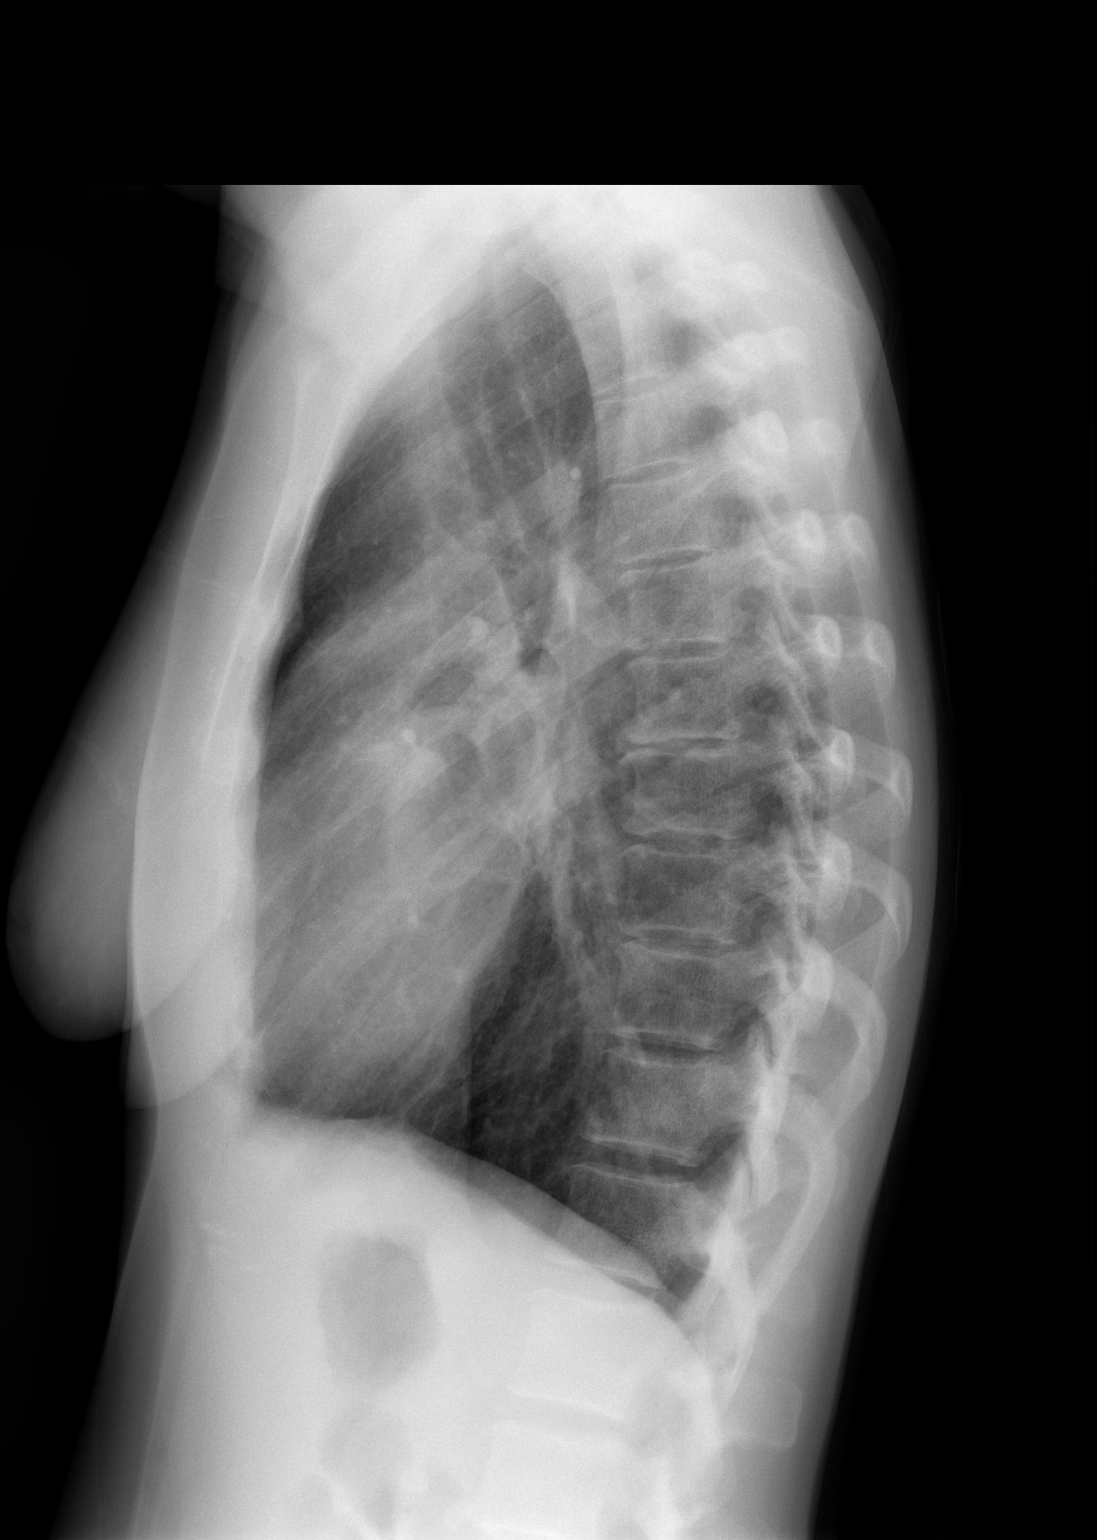

[2 of 2 positions shown; findings below may reference images not displayed]

FINDINGS: Heart size and vascular pattern are normal. There is atelectasis in
the medial right lower lobe. On the left there is an irregular 5 cm
midlung zone opacity with some degree of nodularity.
IMPRESSION: Mass versus pneumonia left mid lung zone. Suggest CT thorax to
better characterize, preferably with contrast.

## 2015-10-21 IMAGING — CR DG CERVICAL SPINE COMPLETE 4+V
6 series · 6 of 6 positions shown · non-contrast
Comparison: None.

CLINICAL DATA: Chronic neck pain

EXAM:
CERVICAL SPINE  4+ VIEWS

[w c-spine lat]
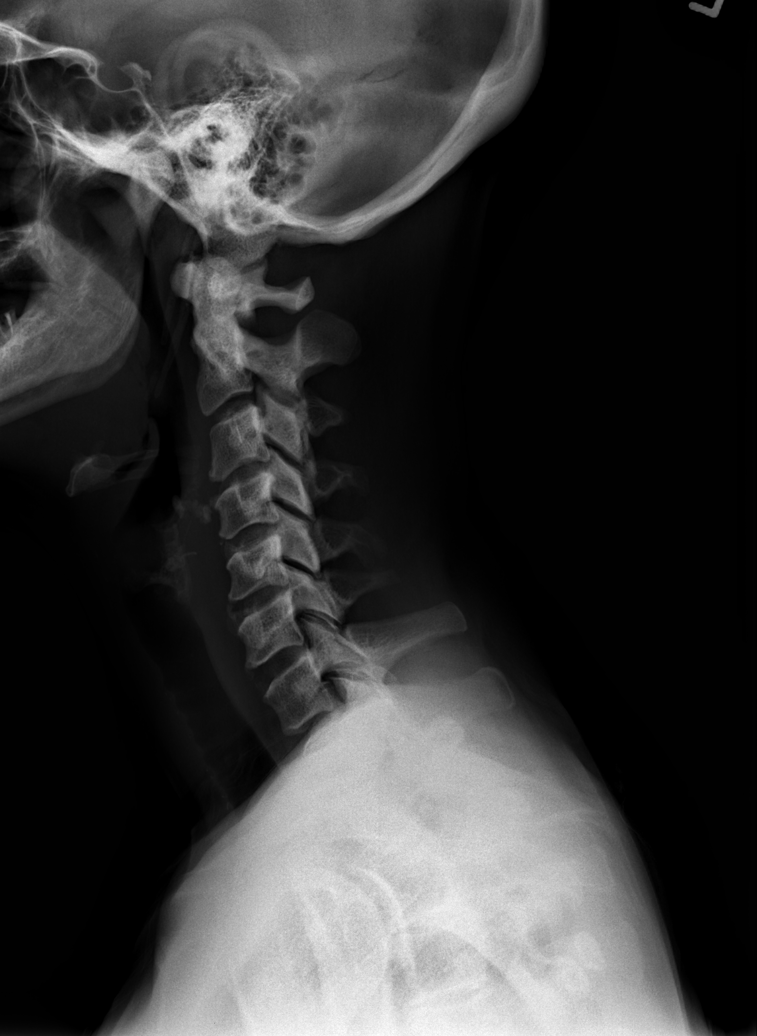

[w c-spine oblique (1 of 2)]
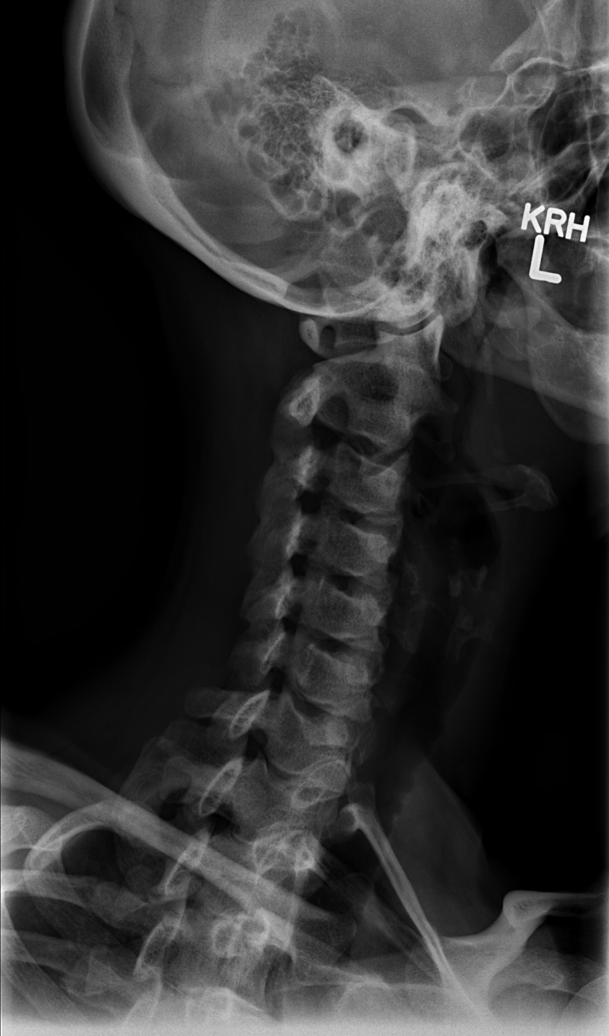

[w c-spine oblique (2 of 2)]
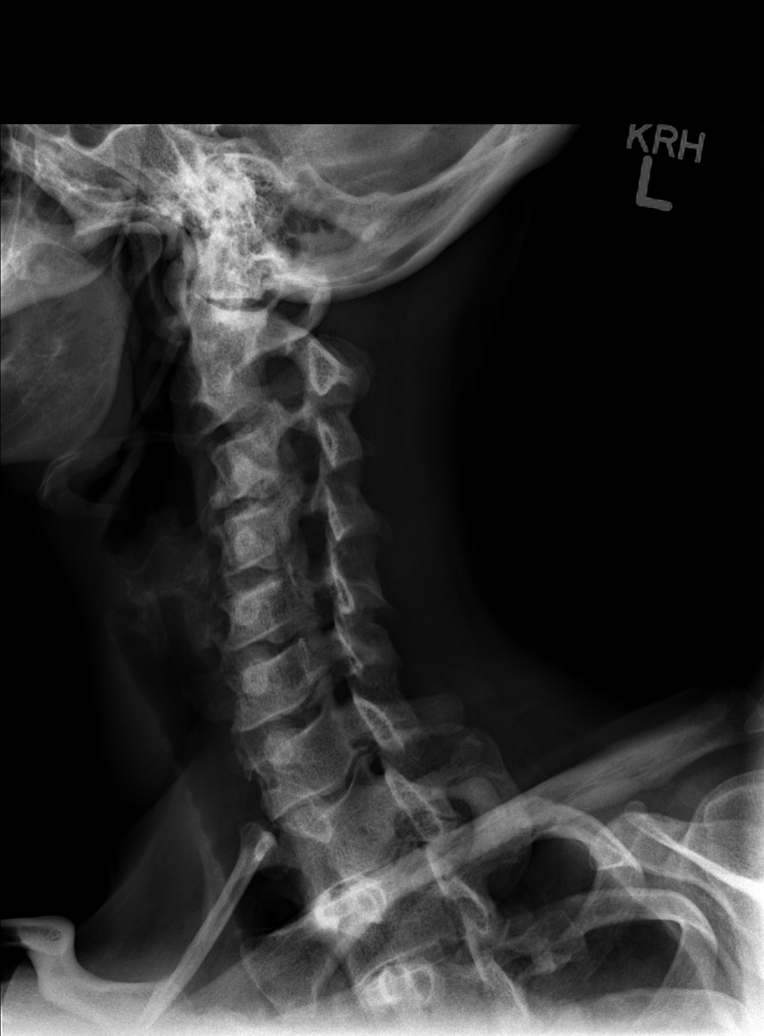

[w c-spine a.p.]
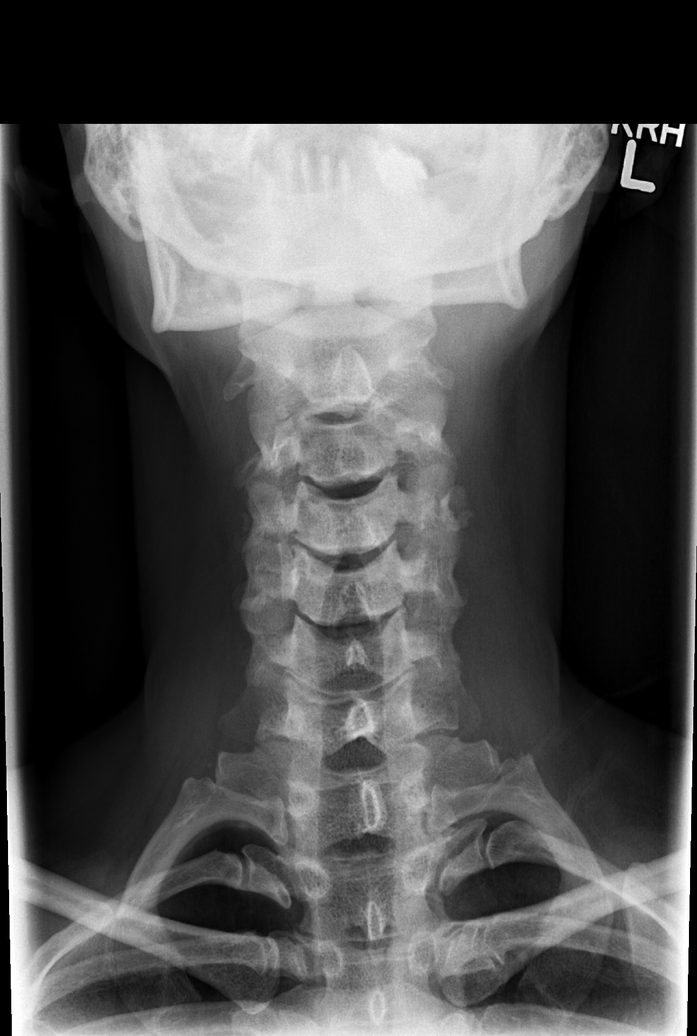

[w c-spine odontoid (1 of 2)]
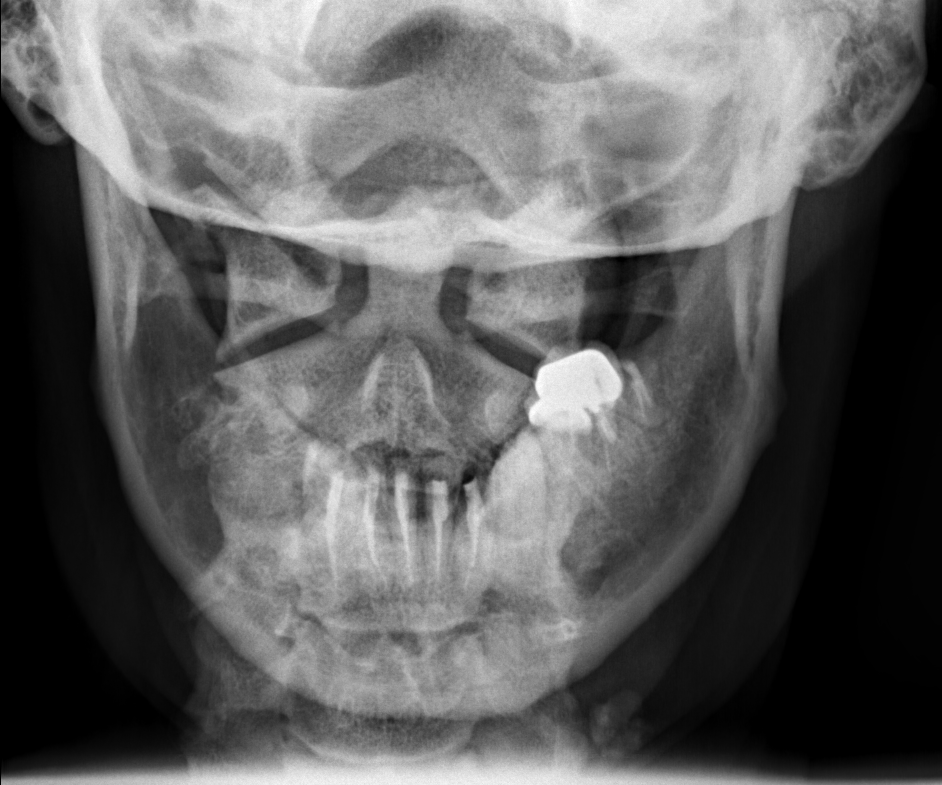

[w c-spine odontoid (2 of 2)]
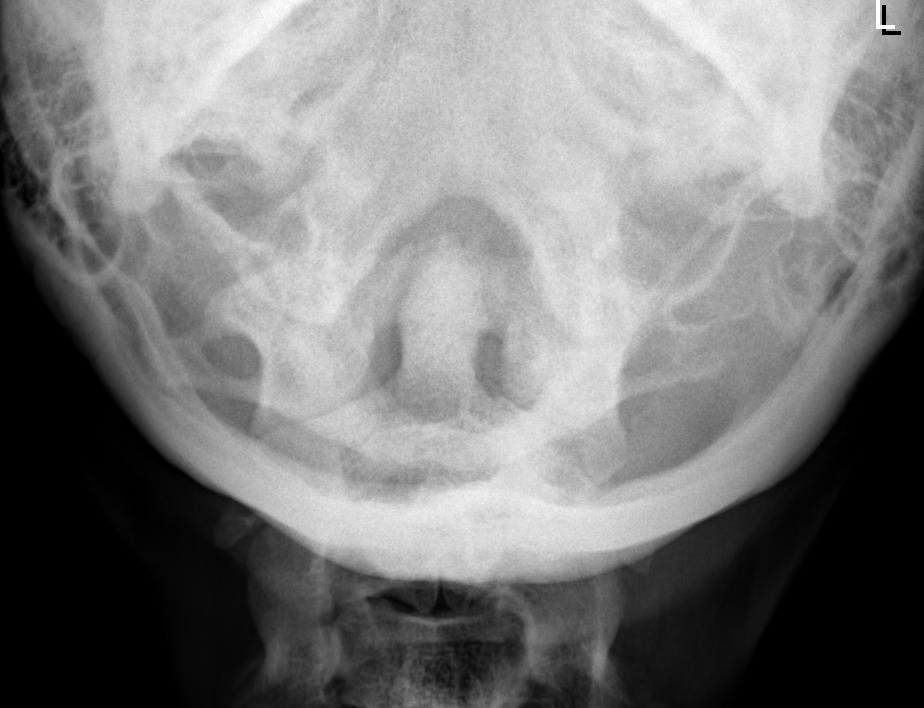

[6 of 6 positions shown; findings below may reference images not displayed]

FINDINGS: Seven cervical segments are well visualized. Vertebral body height
is well maintained. Osteophytic changes are noted from C3 to C6. The
neural foramina are widely patent. No acute fracture or acute facet
abnormality is noted. Chronic calcifications are noted on the left.
The odontoid is within normal limits.
IMPRESSION: Mild degenerative changes without acute abnormality.

## 2015-10-21 IMAGING — CT CT CHEST W/ CM
2 of 3 series · 15 of 36 positions shown, 18 images · IV contrast (APPLIED)
Comparison: Chest radiograph, 09/16/2013.  Chest CT, 05/09/2012.

ADDENDUM:
Patient also had a prior chest CT from 05/09/2012. This prior CT
showed a small, 4.8 mm nodule in the right upper lobe, not evident
on the current study. Another ill-defined nodule was noted in the
posterior right upper lobe near the superior oblique fissure, also
not evident currently. The changes described in the above report are
new from the prior exam.
CLINICAL DATA: Abnormal chest radiograph.  Cough for 1 month.

EXAM:
CT CHEST WITH CONTRAST
TECHNIQUE: Multidetector CT imaging of the chest was performed during
intravenous contrast administration.
CONTRAST:  80mL OMNIPAQUE IOHEXOL 300 MG/ML  SOLN

[Series 2: chest 5.0 b31f · axial · 0.54mm/px · z∈[-381,-136]mm · 12 of 59 slices shown, 15 images]
[im 5/59  mediastinal]
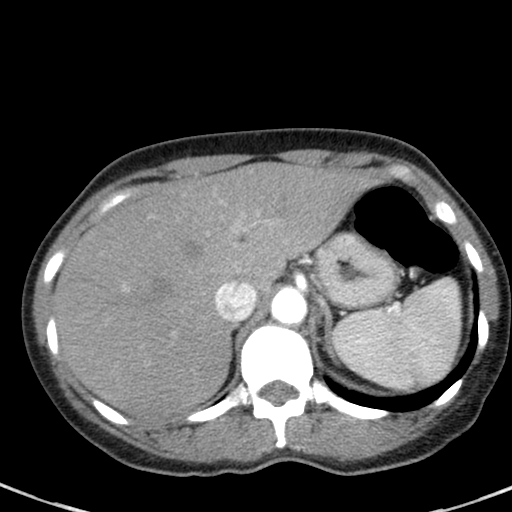
[im 5/59  lung]
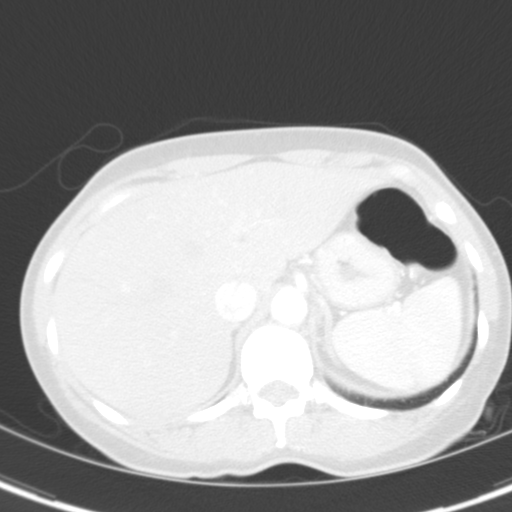
[im 9/59  lung]
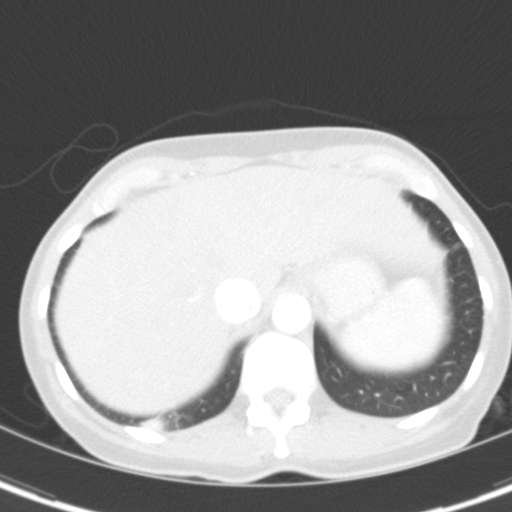
[im 13/59  lung]
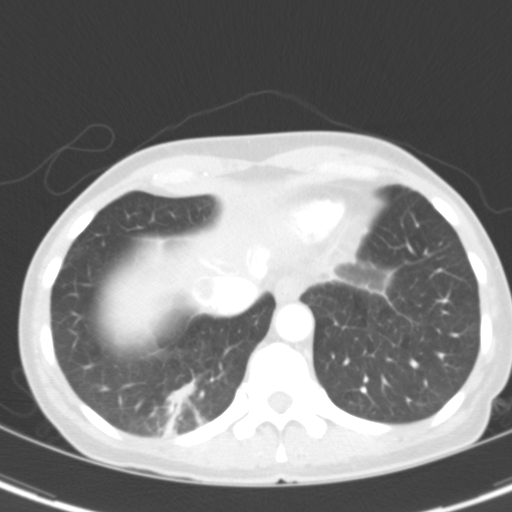
[im 18/59  lung]
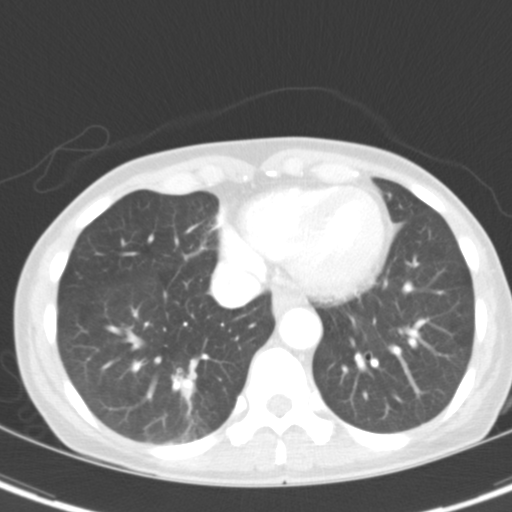
[im 22/59  mediastinal]
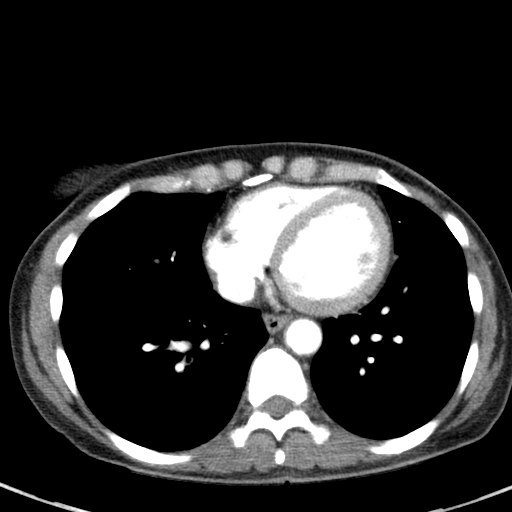
[im 22/59  lung]
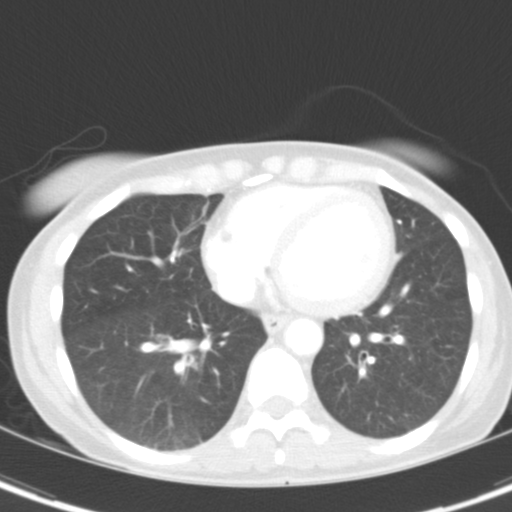
[im 26/59  lung]
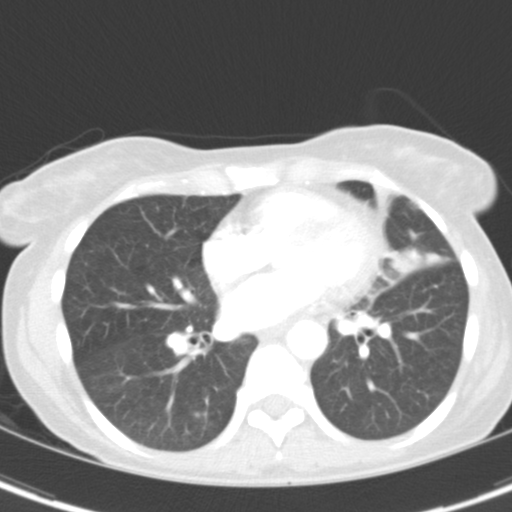
[im 33/59  lung]
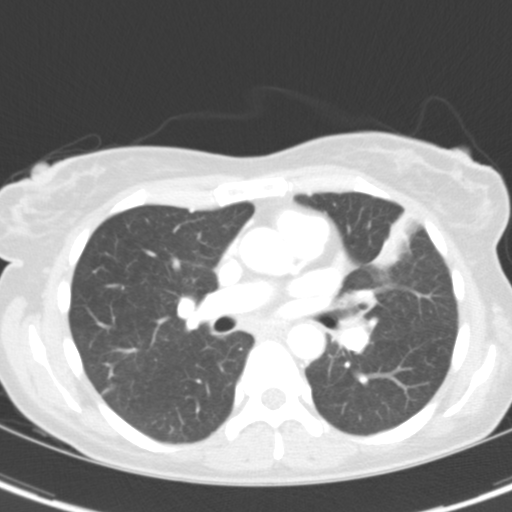
[im 37/59  lung]
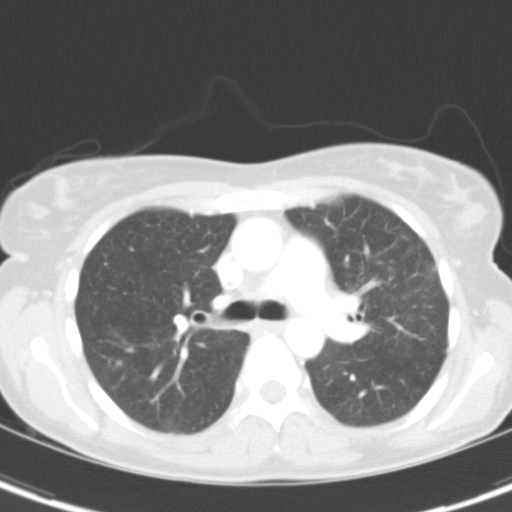
[im 41/59  mediastinal]
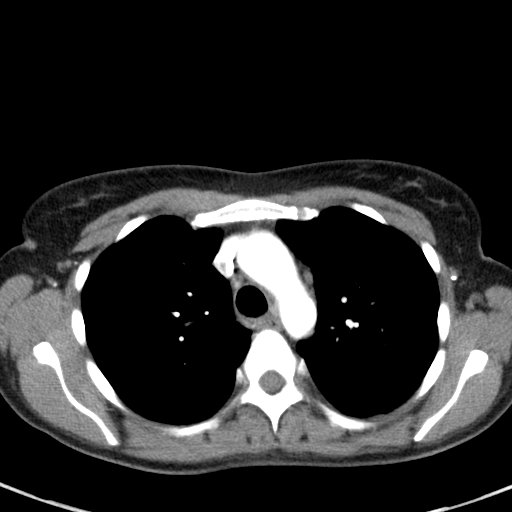
[im 41/59  lung]
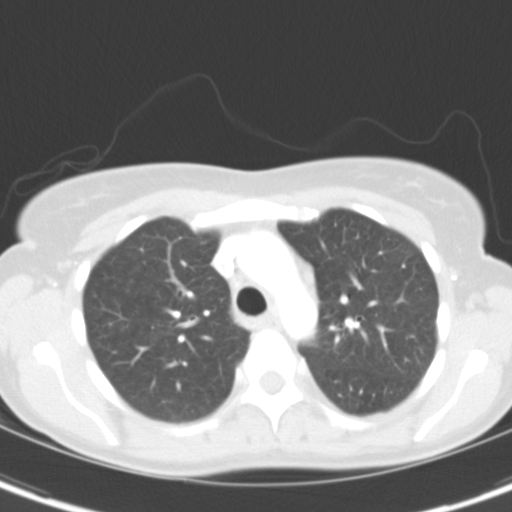
[im 46/59  lung]
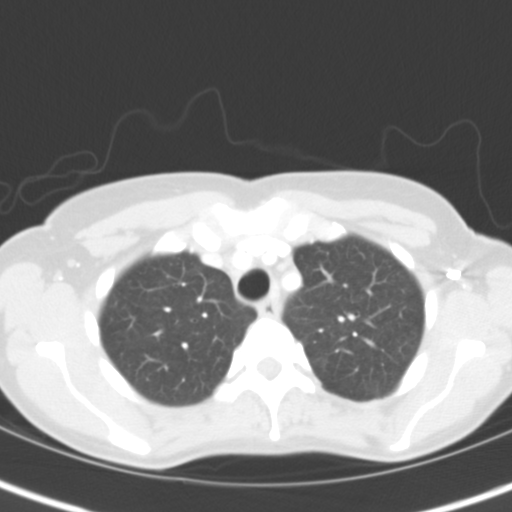
[im 50/59  lung]
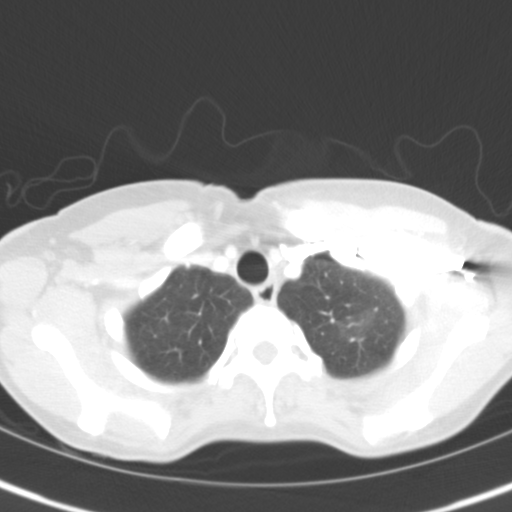
[im 54/59  lung]
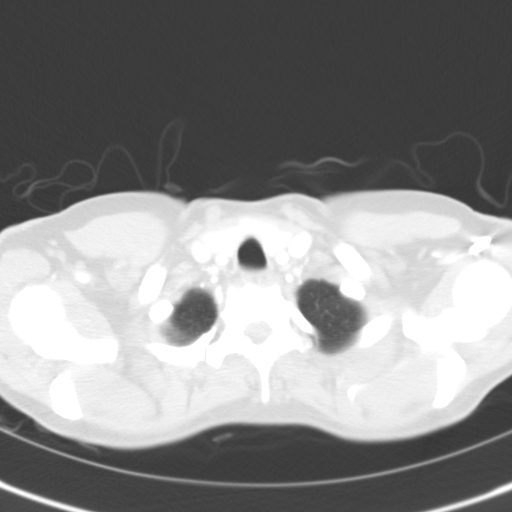

[Series 6: chest 3.0 coronal · coronal · 0.54mm/px · 3 of 65 slices shown]
[im 13/65  lung]
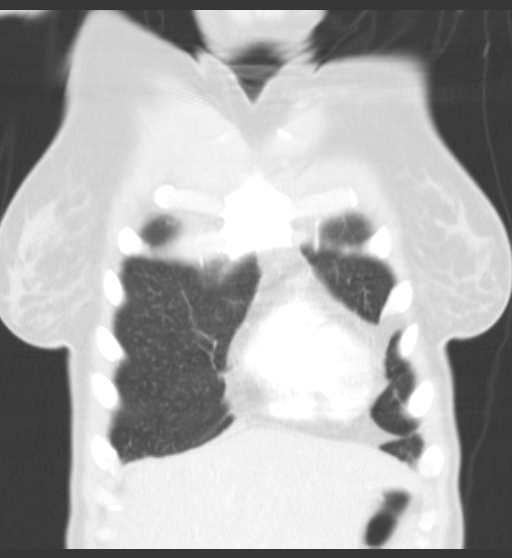
[im 26/65  lung]
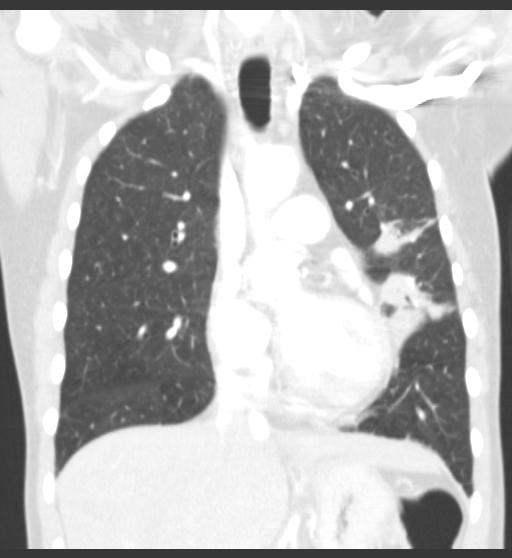
[im 39/65  lung]
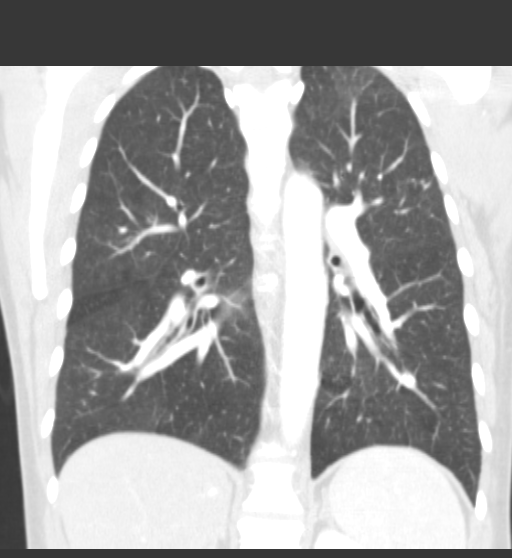

[15 of 36 positions shown; findings below may reference images not displayed]

FINDINGS: There is patchy consolidation in the left upper lobe lateral to the
left hilum extending to the base of the lingula. Discoid type
opacity extends from the mid left upper lobe to the anterior pleural
margin. There is subtle hazy ground-glass opacity at the left apex.
Small area of reticular nodular opacity is noted in the lateral left
upper lobe at the level of the aortic arch. There is a
similar-appearing area in the right upper lobe posteriorly and
laterally near the oblique fissure. Linear type opacity with
peribronchial thickening is noted in the posterior right lower lobe
there is mild linear opacity in the right middle lobe anteriorly and
medially.

No other areas of lung opacity. No discrete pulmonary nodule. No
evidence of edema. No pleural effusion. No pneumothorax.

Heart is normal in size and configuration. There are moderate
coronary artery calcifications. Great vessels are normal in caliber.
Mildly prominent mediastinal lymph nodes, the largest a left para
carinal node measuring 9 mm in short axis. No hilar masses or
adenopathy.

Limited evaluation of the upper abdomen is unremarkable. No neck
base or axillary masses or adenopathy. Mild endplate spurring along
the mid to lower thoracic spine. No other bony abnormality.
IMPRESSION: 1. Areas of lung opacity as detailed above. This is most prominent
in the left upper lobe lingula where likely reflects a combination
of inflammatory or infectious infiltrate and atelectasis. Opacity in
the posterior right lower lobe with associated peribronchial
thickening suggests bronchitis and small bronchiolar mucous plugging
with associated atelectasis. Small areas of reticular nodular
opacity in both upper lobes are also likely infectious or
inflammatory in etiology. Although malignancy is not excluded, this
is felt unlikely in this young patient with no CT evidence of
underlying lung disease. Infectious etiology may be atypical such as
ZEINAB or viral infection. Recommend followup CT in 6-8 weeks after
therapy to document improvement/resolution.
2. Moderate coronary artery calcifications, advanced for the
patient's age.

## 2015-10-21 IMAGING — CR DG THORACIC SPINE 2V
2 series · 2 of 2 positions shown · non-contrast
Comparison: Chest x-ray 03/31/2013.

CLINICAL DATA: Pain.

EXAM:
THORACIC SPINE - 2 VIEW

[w t-spine a.p. *]
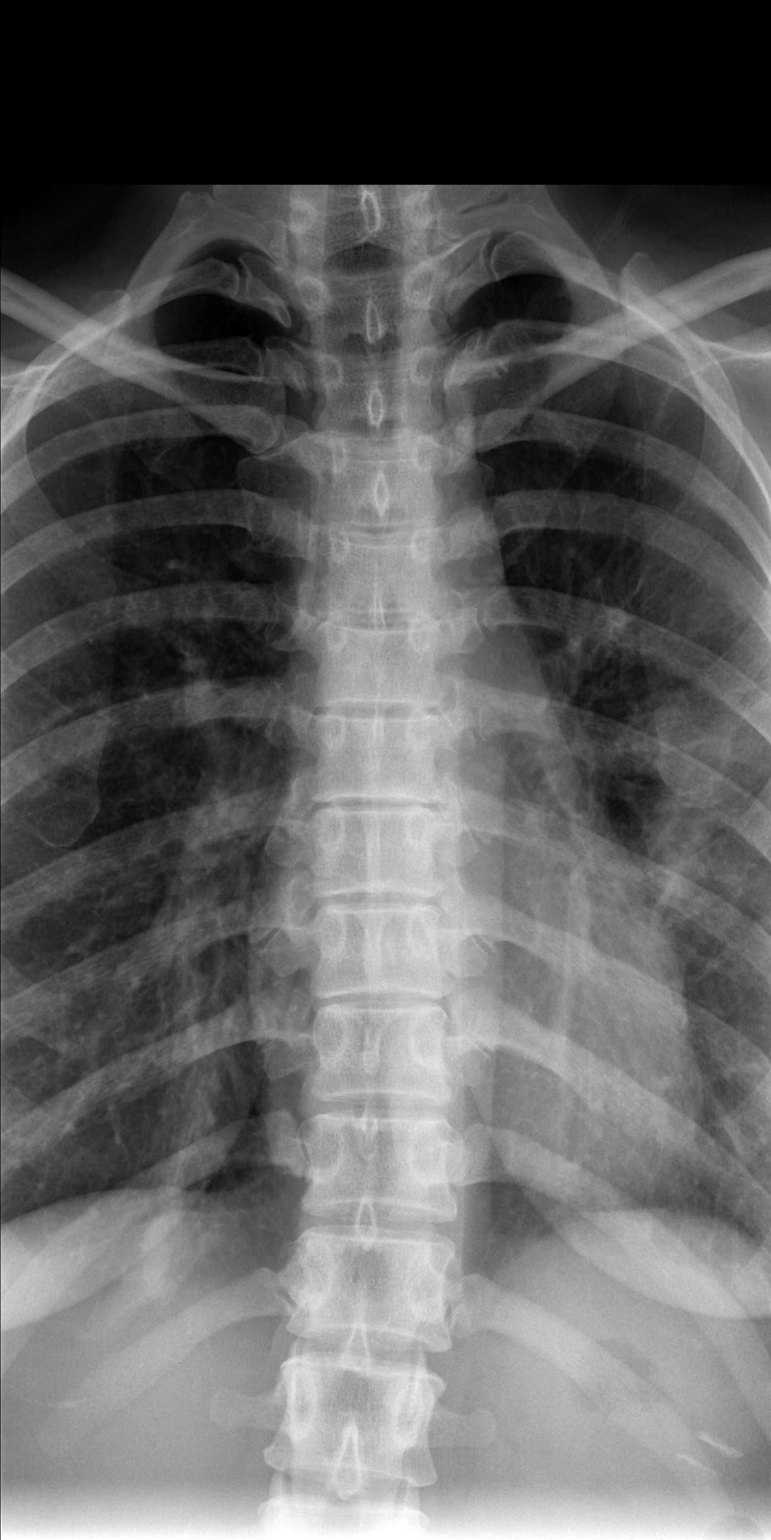

[w t-spine lat *]
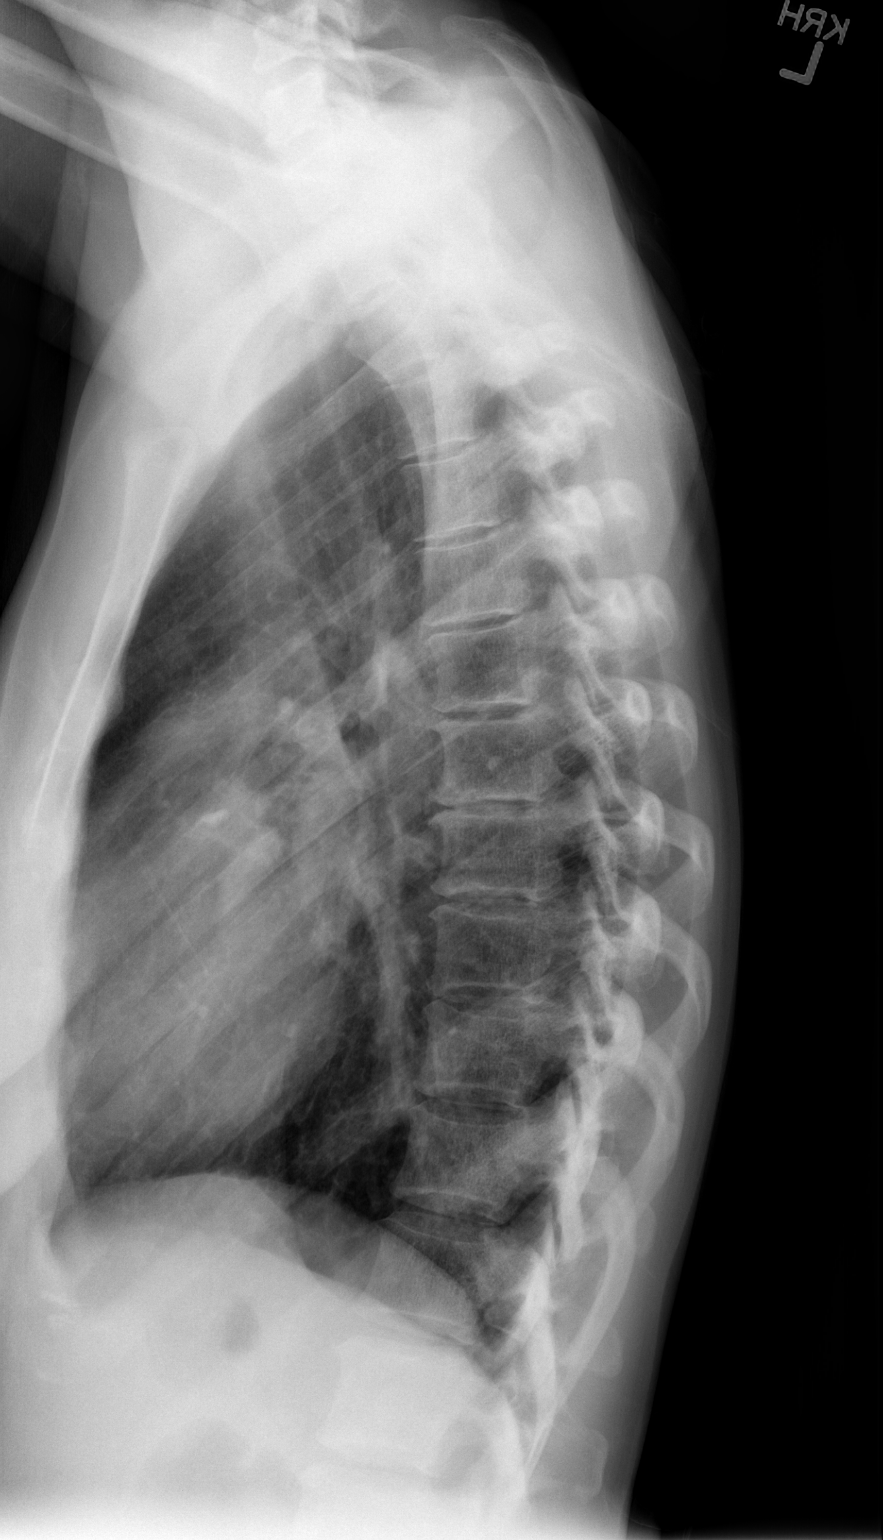

[2 of 2 positions shown; findings below may reference images not displayed]

FINDINGS: Paraspinal soft tissues are normal. No acute bony abnormality.
Normal bony mineralization and alignment. Mild left mid lung
atelectasis and/or infiltrate cannot be excluded. Mild right lung
base subsegmental atelectasis versus infiltrate. PA and lateral
chest x-ray suggested for further evaluation.
IMPRESSION: 1. No focal abnormality noted thoracic spine.
2. Mild left mid lung field and right lower lobe atelectasis and/or
infiltrates. PA and lateral chest x-ray suggested for further
evaluation. a

## 2015-10-22 LAB — URINE CULTURE

## 2015-10-31 ENCOUNTER — Inpatient Hospital Stay (HOSPITAL_COMMUNITY)
Admission: EM | Admit: 2015-10-31 | Discharge: 2015-11-27 | DRG: 004 | Disposition: A | Discharge status: Home Health | Payer: Medicaid Other | Attending: Internal Medicine | Admitting: Internal Medicine

## 2015-10-31 ENCOUNTER — Emergency Department (HOSPITAL_COMMUNITY): Payer: Medicaid Other

## 2015-10-31 ENCOUNTER — Encounter (HOSPITAL_COMMUNITY): Payer: Self-pay | Admitting: *Deleted

## 2015-10-31 DIAGNOSIS — J45909 Unspecified asthma, uncomplicated: Secondary | ICD-10-CM | POA: Diagnosis present

## 2015-10-31 DIAGNOSIS — Z950 Presence of cardiac pacemaker: Secondary | ICD-10-CM

## 2015-10-31 DIAGNOSIS — J9602 Acute respiratory failure with hypercapnia: Secondary | ICD-10-CM | POA: Diagnosis present

## 2015-10-31 DIAGNOSIS — Y95 Nosocomial condition: Secondary | ICD-10-CM | POA: Diagnosis present

## 2015-10-31 DIAGNOSIS — R1310 Dysphagia, unspecified: Secondary | ICD-10-CM

## 2015-10-31 DIAGNOSIS — E2740 Unspecified adrenocortical insufficiency: Secondary | ICD-10-CM | POA: Diagnosis present

## 2015-10-31 DIAGNOSIS — N390 Urinary tract infection, site not specified: Secondary | ICD-10-CM | POA: Diagnosis present

## 2015-10-31 DIAGNOSIS — Z931 Gastrostomy status: Secondary | ICD-10-CM

## 2015-10-31 DIAGNOSIS — Z880 Allergy status to penicillin: Secondary | ICD-10-CM

## 2015-10-31 DIAGNOSIS — L89154 Pressure ulcer of sacral region, stage 4: Secondary | ICD-10-CM | POA: Diagnosis not present

## 2015-10-31 DIAGNOSIS — F17210 Nicotine dependence, cigarettes, uncomplicated: Secondary | ICD-10-CM | POA: Diagnosis present

## 2015-10-31 DIAGNOSIS — D72829 Elevated white blood cell count, unspecified: Secondary | ICD-10-CM

## 2015-10-31 DIAGNOSIS — J9601 Acute respiratory failure with hypoxia: Secondary | ICD-10-CM

## 2015-10-31 DIAGNOSIS — R935 Abnormal findings on diagnostic imaging of other abdominal regions, including retroperitoneum: Secondary | ICD-10-CM

## 2015-10-31 DIAGNOSIS — Z01818 Encounter for other preprocedural examination: Secondary | ICD-10-CM | POA: Diagnosis not present

## 2015-10-31 DIAGNOSIS — R491 Aphonia: Secondary | ICD-10-CM

## 2015-10-31 DIAGNOSIS — R1319 Other dysphagia: Secondary | ICD-10-CM

## 2015-10-31 DIAGNOSIS — R5383 Other fatigue: Secondary | ICD-10-CM | POA: Diagnosis present

## 2015-10-31 DIAGNOSIS — Z87440 Personal history of urinary (tract) infections: Secondary | ICD-10-CM

## 2015-10-31 DIAGNOSIS — K219 Gastro-esophageal reflux disease without esophagitis: Secondary | ICD-10-CM | POA: Diagnosis present

## 2015-10-31 DIAGNOSIS — Z720 Tobacco use: Secondary | ICD-10-CM

## 2015-10-31 DIAGNOSIS — J4520 Mild intermittent asthma, uncomplicated: Secondary | ICD-10-CM | POA: Diagnosis not present

## 2015-10-31 DIAGNOSIS — R6521 Severe sepsis with septic shock: Secondary | ICD-10-CM | POA: Diagnosis present

## 2015-10-31 DIAGNOSIS — Y92009 Unspecified place in unspecified non-institutional (private) residence as the place of occurrence of the external cause: Secondary | ICD-10-CM

## 2015-10-31 DIAGNOSIS — G9341 Metabolic encephalopathy: Secondary | ICD-10-CM | POA: Diagnosis present

## 2015-10-31 DIAGNOSIS — Y92239 Unspecified place in hospital as the place of occurrence of the external cause: Secondary | ICD-10-CM | POA: Diagnosis present

## 2015-10-31 DIAGNOSIS — L89141 Pressure ulcer of left lower back, stage 1: Secondary | ICD-10-CM | POA: Diagnosis present

## 2015-10-31 DIAGNOSIS — E43 Unspecified severe protein-calorie malnutrition: Secondary | ICD-10-CM | POA: Diagnosis present

## 2015-10-31 DIAGNOSIS — E876 Hypokalemia: Secondary | ICD-10-CM | POA: Diagnosis not present

## 2015-10-31 DIAGNOSIS — E440 Moderate protein-calorie malnutrition: Secondary | ICD-10-CM | POA: Insufficient documentation

## 2015-10-31 DIAGNOSIS — N319 Neuromuscular dysfunction of bladder, unspecified: Secondary | ICD-10-CM | POA: Diagnosis present

## 2015-10-31 DIAGNOSIS — L89324 Pressure ulcer of left buttock, stage 4: Secondary | ICD-10-CM | POA: Diagnosis present

## 2015-10-31 DIAGNOSIS — R0602 Shortness of breath: Secondary | ICD-10-CM

## 2015-10-31 DIAGNOSIS — G8254 Quadriplegia, C5-C7 incomplete: Secondary | ICD-10-CM | POA: Diagnosis present

## 2015-10-31 DIAGNOSIS — T380X5A Adverse effect of glucocorticoids and synthetic analogues, initial encounter: Secondary | ICD-10-CM | POA: Diagnosis present

## 2015-10-31 DIAGNOSIS — L89121 Pressure ulcer of left upper back, stage 1: Secondary | ICD-10-CM | POA: Diagnosis present

## 2015-10-31 DIAGNOSIS — E872 Acidosis: Secondary | ICD-10-CM | POA: Diagnosis present

## 2015-10-31 DIAGNOSIS — M8660 Other chronic osteomyelitis, unspecified site: Secondary | ICD-10-CM | POA: Diagnosis present

## 2015-10-31 DIAGNOSIS — Z7189 Other specified counseling: Secondary | ICD-10-CM | POA: Diagnosis not present

## 2015-10-31 DIAGNOSIS — G9340 Encephalopathy, unspecified: Secondary | ICD-10-CM | POA: Diagnosis not present

## 2015-10-31 DIAGNOSIS — R9389 Abnormal findings on diagnostic imaging of other specified body structures: Secondary | ICD-10-CM

## 2015-10-31 DIAGNOSIS — Z4659 Encounter for fitting and adjustment of other gastrointestinal appliance and device: Secondary | ICD-10-CM

## 2015-10-31 DIAGNOSIS — G4710 Hypersomnia, unspecified: Secondary | ICD-10-CM | POA: Diagnosis present

## 2015-10-31 DIAGNOSIS — L89314 Pressure ulcer of right buttock, stage 4: Secondary | ICD-10-CM | POA: Diagnosis present

## 2015-10-31 DIAGNOSIS — Z431 Encounter for attention to gastrostomy: Secondary | ICD-10-CM

## 2015-10-31 DIAGNOSIS — R739 Hyperglycemia, unspecified: Secondary | ICD-10-CM | POA: Diagnosis not present

## 2015-10-31 DIAGNOSIS — G8250 Quadriplegia, unspecified: Secondary | ICD-10-CM | POA: Diagnosis present

## 2015-10-31 DIAGNOSIS — R9431 Abnormal electrocardiogram [ECG] [EKG]: Secondary | ICD-10-CM | POA: Diagnosis not present

## 2015-10-31 DIAGNOSIS — Z79899 Other long term (current) drug therapy: Secondary | ICD-10-CM

## 2015-10-31 DIAGNOSIS — J398 Other specified diseases of upper respiratory tract: Secondary | ICD-10-CM

## 2015-10-31 DIAGNOSIS — Z430 Encounter for attention to tracheostomy: Secondary | ICD-10-CM

## 2015-10-31 DIAGNOSIS — Z930 Tracheostomy status: Secondary | ICD-10-CM | POA: Diagnosis not present

## 2015-10-31 DIAGNOSIS — R6520 Severe sepsis without septic shock: Secondary | ICD-10-CM | POA: Diagnosis present

## 2015-10-31 DIAGNOSIS — T40601A Poisoning by unspecified narcotics, accidental (unintentional), initial encounter: Secondary | ICD-10-CM

## 2015-10-31 DIAGNOSIS — J9600 Acute respiratory failure, unspecified whether with hypoxia or hypercapnia: Secondary | ICD-10-CM | POA: Diagnosis not present

## 2015-10-31 DIAGNOSIS — A419 Sepsis, unspecified organism: Secondary | ICD-10-CM | POA: Diagnosis present

## 2015-10-31 DIAGNOSIS — J189 Pneumonia, unspecified organism: Secondary | ICD-10-CM | POA: Diagnosis not present

## 2015-10-31 DIAGNOSIS — E162 Hypoglycemia, unspecified: Secondary | ICD-10-CM | POA: Diagnosis present

## 2015-10-31 DIAGNOSIS — D638 Anemia in other chronic diseases classified elsewhere: Secondary | ICD-10-CM | POA: Diagnosis present

## 2015-10-31 DIAGNOSIS — K567 Ileus, unspecified: Secondary | ICD-10-CM | POA: Diagnosis present

## 2015-10-31 DIAGNOSIS — K5900 Constipation, unspecified: Secondary | ICD-10-CM

## 2015-10-31 DIAGNOSIS — Z515 Encounter for palliative care: Secondary | ICD-10-CM | POA: Diagnosis not present

## 2015-10-31 DIAGNOSIS — Z681 Body mass index (BMI) 19 or less, adult: Secondary | ICD-10-CM | POA: Diagnosis not present

## 2015-10-31 DIAGNOSIS — Z888 Allergy status to other drugs, medicaments and biological substances status: Secondary | ICD-10-CM

## 2015-10-31 DIAGNOSIS — R0603 Acute respiratory distress: Secondary | ICD-10-CM

## 2015-10-31 DIAGNOSIS — L89131 Pressure ulcer of right lower back, stage 1: Secondary | ICD-10-CM | POA: Diagnosis present

## 2015-10-31 DIAGNOSIS — E46 Unspecified protein-calorie malnutrition: Secondary | ICD-10-CM

## 2015-10-31 DIAGNOSIS — M62838 Other muscle spasm: Secondary | ICD-10-CM | POA: Diagnosis not present

## 2015-10-31 DIAGNOSIS — L89890 Pressure ulcer of other site, unstageable: Secondary | ICD-10-CM | POA: Diagnosis present

## 2015-10-31 HISTORY — DX: Pressure ulcer of sacral region, stage 4: L89.154

## 2015-10-31 HISTORY — DX: Unspecified protein-calorie malnutrition: E46

## 2015-10-31 LAB — COMPREHENSIVE METABOLIC PANEL
ALK PHOS: 178 U/L — AB (ref 38–126)
ALT: 8 U/L — AB (ref 14–54)
AST: 13 U/L — AB (ref 15–41)
Albumin: 1.9 g/dL — ABNORMAL LOW (ref 3.5–5.0)
Anion gap: 11 (ref 5–15)
BUN: 29 mg/dL — AB (ref 6–20)
CALCIUM: 8.5 mg/dL — AB (ref 8.9–10.3)
CHLORIDE: 100 mmol/L — AB (ref 101–111)
CO2: 24 mmol/L (ref 22–32)
CREATININE: 0.71 mg/dL (ref 0.44–1.00)
GFR calc non Af Amer: >60 mL/min (ref >60)
Glucose, Bld: 127 mg/dL — ABNORMAL HIGH (ref 65–99)
Potassium: 3.5 mmol/L (ref 3.5–5.1)
SODIUM: 135 mmol/L (ref 135–145)
Total Bilirubin: 0.6 mg/dL (ref 0.3–1.2)
Total Protein: 5.4 g/dL — ABNORMAL LOW (ref 6.5–8.1)

## 2015-10-31 LAB — APTT: APTT: 32 s (ref 24–36)

## 2015-10-31 LAB — I-STAT CG4 LACTIC ACID, ED
LACTIC ACID, VENOUS: 2.77 mmol/L — AB (ref 0.5–1.9)
Lactic Acid, Venous: 2.77 mmol/L (ref 0.5–1.9)

## 2015-10-31 LAB — URINE MICROSCOPIC-ADD ON

## 2015-10-31 LAB — URINALYSIS, ROUTINE W REFLEX MICROSCOPIC
Bilirubin Urine: NEGATIVE
GLUCOSE, UA: NEGATIVE mg/dL
KETONES UR: NEGATIVE mg/dL
Nitrite: NEGATIVE
PH: 6 (ref 5.0–8.0)
Protein, ur: 30 mg/dL — AB
SPECIFIC GRAVITY, URINE: 1.018 (ref 1.005–1.030)

## 2015-10-31 LAB — LACTIC ACID, PLASMA: LACTIC ACID, VENOUS: 3.2 mmol/L — AB (ref 0.5–1.9)

## 2015-10-31 LAB — PROTIME-INR
INR: 1.17
PROTHROMBIN TIME: 14.9 s (ref 11.4–15.2)

## 2015-10-31 LAB — PROCALCITONIN: PROCALCITONIN: 0.24 ng/mL

## 2015-10-31 MED ORDER — LEVOFLOXACIN IN D5W 750 MG/150ML IV SOLN
750.0000 mg | Freq: Once | INTRAVENOUS | Status: AC
  Administered 2015-10-31: 750 mg via INTRAVENOUS
  Filled 2015-10-31: qty 150

## 2015-10-31 MED ORDER — IBUPROFEN 200 MG PO TABS
400.0000 mg | ORAL_TABLET | Freq: Four times a day (QID) | ORAL | Status: DC | PRN
  Administered 2015-10-31: 400 mg via ORAL
  Filled 2015-10-31 (×2): qty 2

## 2015-10-31 MED ORDER — NALOXONE HCL 0.4 MG/ML IJ SOLN
0.4000 mg | Freq: Once | INTRAMUSCULAR | Status: AC
  Administered 2015-10-31: 0.4 mg via INTRAVENOUS
  Filled 2015-10-31: qty 1

## 2015-10-31 MED ORDER — DEXTROSE 5 % IV SOLN
2.0000 g | Freq: Once | INTRAVENOUS | Status: AC
  Administered 2015-10-31: 2 g via INTRAVENOUS
  Filled 2015-10-31: qty 2

## 2015-10-31 MED ORDER — ACETAMINOPHEN 325 MG PO TABS: 650.0000 mg | ORAL_TABLET | Freq: Four times a day (QID) | ORAL | Status: DC | PRN

## 2015-10-31 MED ORDER — ENSURE ENLIVE PO LIQD: 237.0000 mL | Freq: Two times a day (BID) | ORAL | Status: DC

## 2015-10-31 MED ORDER — NALOXONE HCL 0.4 MG/ML IJ SOLN
0.4000 mg | Freq: Once | INTRAMUSCULAR | Status: AC
  Administered 2015-10-31: 0.4 mg via NASAL
  Filled 2015-10-31: qty 1

## 2015-10-31 MED ORDER — SODIUM CHLORIDE 0.9% FLUSH
3.0000 mL | Freq: Two times a day (BID) | INTRAVENOUS | Status: DC
  Administered 2015-10-31 – 2015-11-27 (×46): 3 mL via INTRAVENOUS

## 2015-10-31 MED ORDER — SODIUM CHLORIDE 0.9 % IV BOLUS (SEPSIS)
1000.0000 mL | Freq: Once | INTRAVENOUS | Status: AC
  Administered 2015-10-31: 1000 mL via INTRAVENOUS

## 2015-10-31 MED ORDER — FERROUS SULFATE 325 (65 FE) MG PO TABS
325.0000 mg | ORAL_TABLET | Freq: Three times a day (TID) | ORAL | Status: DC
  Administered 2015-10-31 – 2015-11-01 (×2): 325 mg via ORAL
  Filled 2015-10-31 (×3): qty 1

## 2015-10-31 MED ORDER — ENSURE ENLIVE PO LIQD
237.0000 mL | Freq: Two times a day (BID) | ORAL | Status: DC
  Administered 2015-11-01: 237 mL via ORAL

## 2015-10-31 MED ORDER — ENOXAPARIN SODIUM 30 MG/0.3ML ~~LOC~~ SOLN
30.0000 mg | Freq: Every day | SUBCUTANEOUS | Status: DC
  Administered 2015-10-31: 30 mg via SUBCUTANEOUS
  Filled 2015-10-31: qty 0.3

## 2015-10-31 MED ORDER — GUAIFENESIN ER 600 MG PO TB12
600.0000 mg | ORAL_TABLET | Freq: Two times a day (BID) | ORAL | Status: DC
  Administered 2015-10-31 – 2015-11-01 (×2): 600 mg via ORAL
  Filled 2015-10-31 (×3): qty 1

## 2015-10-31 MED ORDER — ACETAMINOPHEN 650 MG RE SUPP
650.0000 mg | Freq: Four times a day (QID) | RECTAL | Status: DC | PRN
Start: 2015-10-31 — End: 2015-11-01

## 2015-10-31 MED ORDER — GABAPENTIN 400 MG PO CAPS
400.0000 mg | ORAL_CAPSULE | Freq: Three times a day (TID) | ORAL | Status: DC
  Administered 2015-10-31 – 2015-11-02 (×5): 400 mg via ORAL
  Filled 2015-10-31 (×5): qty 1

## 2015-10-31 MED ORDER — VANCOMYCIN HCL IN DEXTROSE 1-5 GM/200ML-% IV SOLN
1000.0000 mg | Freq: Once | INTRAVENOUS | Status: AC
  Administered 2015-10-31: 1000 mg via INTRAVENOUS
  Filled 2015-10-31: qty 200

## 2015-10-31 MED ORDER — VANCOMYCIN HCL IN DEXTROSE 1-5 GM/200ML-% IV SOLN
1000.0000 mg | INTRAVENOUS | Status: DC
  Administered 2015-11-01 – 2015-11-03 (×3): 1000 mg via INTRAVENOUS
  Filled 2015-10-31 (×2): qty 200

## 2015-10-31 MED ORDER — DEXTROSE 5 % IV SOLN
1.0000 g | Freq: Three times a day (TID) | INTRAVENOUS | Status: DC
  Administered 2015-11-01 (×3): 1 g via INTRAVENOUS
  Filled 2015-10-31 (×4): qty 1

## 2015-10-31 MED ORDER — ALBUTEROL SULFATE (2.5 MG/3ML) 0.083% IN NEBU
2.5000 mg | INHALATION_SOLUTION | RESPIRATORY_TRACT | Status: DC | PRN
  Administered 2015-11-06: 2.5 mg via RESPIRATORY_TRACT
  Filled 2015-10-31: qty 3

## 2015-10-31 MED ORDER — ONDANSETRON HCL 4 MG/2ML IJ SOLN
4.0000 mg | Freq: Four times a day (QID) | INTRAMUSCULAR | Status: DC | PRN
  Administered 2015-11-01 – 2015-11-08 (×3): 4 mg via INTRAVENOUS
  Filled 2015-10-31 (×3): qty 2

## 2015-10-31 MED ORDER — NALOXONE HCL 0.4 MG/ML IJ SOLN: 0.4000 mg | INTRAMUSCULAR | Status: DC | PRN

## 2015-10-31 MED ORDER — DULOXETINE HCL 30 MG PO CPEP
30.0000 mg | ORAL_CAPSULE | Freq: Two times a day (BID) | ORAL | Status: DC
  Administered 2015-10-31 – 2015-11-01 (×3): 30 mg via ORAL
  Filled 2015-10-31 (×4): qty 1

## 2015-10-31 MED ORDER — SODIUM CHLORIDE 0.9 % IV BOLUS (SEPSIS)
500.0000 mL | Freq: Once | INTRAVENOUS | Status: AC
  Administered 2015-10-31: 500 mL via INTRAVENOUS

## 2015-10-31 MED ORDER — IPRATROPIUM BROMIDE 0.02 % IN SOLN
0.5000 mg | Freq: Four times a day (QID) | RESPIRATORY_TRACT | Status: DC
  Administered 2015-10-31: 0.5 mg via RESPIRATORY_TRACT
  Filled 2015-10-31: qty 2.5

## 2015-10-31 MED ORDER — LEVOFLOXACIN IN D5W 750 MG/150ML IV SOLN
750.0000 mg | INTRAVENOUS | Status: DC
  Administered 2015-11-01: 750 mg via INTRAVENOUS
  Filled 2015-10-31: qty 150

## 2015-10-31 MED ORDER — IPRATROPIUM-ALBUTEROL 0.5-2.5 (3) MG/3ML IN SOLN
3.0000 mL | Freq: Three times a day (TID) | RESPIRATORY_TRACT | Status: DC
  Administered 2015-11-01 – 2015-11-02 (×4): 3 mL via RESPIRATORY_TRACT
  Filled 2015-10-31 (×4): qty 3

## 2015-10-31 MED ORDER — MIDODRINE HCL 5 MG PO TABS
5.0000 mg | ORAL_TABLET | Freq: Three times a day (TID) | ORAL | Status: DC
  Administered 2015-11-01: 5 mg via ORAL
  Filled 2015-10-31: qty 1

## 2015-10-31 MED ORDER — SODIUM CHLORIDE 0.9 % IV SOLN
1000.0000 mL | INTRAVENOUS | Status: DC
  Administered 2015-10-31 (×2): 1000 mL via INTRAVENOUS

## 2015-10-31 MED ORDER — ONDANSETRON HCL 4 MG PO TABS
4.0000 mg | ORAL_TABLET | Freq: Four times a day (QID) | ORAL | Status: DC | PRN
Start: 2015-10-31 — End: 2015-11-02

## 2015-10-31 NOTE — ED Triage Notes (Signed)
Per EMS pt from home with c/o increased lethargy and respiratory distress, starting this morning. Per EMS pt's mom stated pt's dad died from lung cancer which started with si,ilar symptoms pt is having now. Mom sent a note with EMS with a list of procedures she wants done for pt, such as CT scans, esophagus scan? and all necessary blood work to rule out lung cancer.

## 2015-10-31 NOTE — Progress Notes (Signed)
CRITICAL VALUE ALERT  Critical value received:  Lactic acid 3.2  Date of notification:  10/31/2015  Time of notification:  2311  Critical value read back:Yes.    Nurse who received alert:  Luther Parody, RN  MD notified (1st page):  K.Kirby  Time of first page:  2311  MD notified (2nd page):  Time of second page:  Responding MD: Tylene Fantasia  Time MD responded: 2330

## 2015-10-31 NOTE — Progress Notes (Addendum)
Per chart review, patient was receiving services with [facility].  EDCM placed call to [facility] for collateral information.  Awaiting call back.  10/31/2015 1641pm  EDCM received call back from [place] who reports patient is no longer active with their services.

## 2015-10-31 NOTE — ED Provider Notes (Signed)
[facility] DEPT Provider Note   CSN: [id number] Arrival date & time: 10/31/15  1422     History   Chief Complaint Chief Complaint  Patient presents with  . Respiratory Distress  . Lethargic    HPI Angela Page is a 45 y.o. female.  HPI Patient presents via EMS with concern for decreased responsiveness. The patient herself is listless, minimally awake, awakens briefly, with reaction of painful stimuli, but is not responding appropriately. EMS reports that the patient described pain in her hands, but otherwise was minimally interactive en route. Nursing states that the patient's family members voiced concern of the patient possibly having cancer and request evaluation for this. Level V caveat secondary to mental status Past Medical History:  Diagnosis Date  . Asthma   . GERD (gastroesophageal reflux disease)   . Pacemaker   . Pleurisy   . Quadriparesis [facility])     Patient Active Problem List   Diagnosis Date Noted  . Pressure ulcer 02/26/2015  . HCAP (healthcare-associated pneumonia) 02/25/2015  . UTI (lower urinary tract infection) 02/25/2015  . Hypotension 02/25/2015  . Anemia 02/25/2015  . Enterococcus UTI 02/03/2015  . Sepsis ([place]) 01/31/2015  . RLL pneumonia ([place]) 01/31/2015  . Altered mental status 10/29/2014  . Encephalopathy, toxic 10/29/2014  . Toxic metabolic encephalopathy [id number]  . Urinary tract infection due to Proteus 10/24/2014  . Leukocytosis 10/23/2014  . Hypokalemia 10/23/2014  . Pressure ulcer of sacral region, stage 4 10/23/2014  . Anemia, iron deficiency 10/23/2014  . Severe sepsis ([place])   . Septic shock ([place]) 10/22/2014  . Quadriplegia, C5-C7 incomplete ([place]) 10/22/2014  . Depression   . Protein-calorie malnutrition, severe ([place]) 09/05/2014  . Overdose of opiate or related narcotic 09/02/2014    Past Surgical History:  Procedure Laterality Date  . APPENDECTOMY    . CARDIAC SURGERY    . I&D EXTREMITY  10/28/2011   Procedure:  IRRIGATION AND DEBRIDEMENT EXTREMITY;  Surgeon: Tennis Must, MD;  Location: [facility];  Service: Orthopedics;  Laterality: Right;  Irrigation and debridement right middle finger    OB History    No data available       Home Medications    Prior to Admission medications   Medication Sig Start Date End Date Taking? Authorizing Provider  acetaminophen (TYLENOL) 500 MG tablet Take 1,000 mg by mouth every 6 (six) hours as needed for moderate pain.    Historical Provider, MD  albuterol (PROVENTIL HFA;VENTOLIN HFA) 108 (90 BASE) MCG/ACT inhaler Inhale 2 puffs into the lungs every 6 (six) hours as needed. For shortness of breath     Historical Provider, MD  baclofen (LIORESAL) 20 MG tablet Take 20 mg by mouth 3 (three) times daily.    Historical Provider, MD  ciprofloxacin (CIPRO) 500 MG tablet Take 1 tablet (500 mg total) by mouth 2 (two) times daily. 10/20/15   Fredia Sorrow, MD  [employer] VITAMIN B12 1000 MCG tablet Take 1 tablet by mouth daily. 08/22/14   Historical Provider, MD  dextromethorphan-guaiFENesin (MUCINEX DM) 30-600 MG 12hr tablet Take 1 tablet by mouth 2 (two) times daily. Patient taking differently: Take 1 tablet by mouth 2 (two) times daily as needed for cough.  03/01/15   Debbe Odea, MD  DULoxetine (CYMBALTA) 30 MG capsule Take 30 mg by mouth 2 (two) times daily. 01/06/15   Historical Provider, MD  ENSURE (ENSURE) Take 237 mLs by mouth 2 (two) times daily.    Historical Provider, MD  ferrous sulfate 325 (65  FE) MG tablet Take 1 tablet (325 mg total) by mouth 3 (three) times daily. 08/04/14   Janece Canterbury, MD  gabapentin (NEURONTIN) 400 MG capsule Take 400 mg by mouth 3 (three) times daily. 10/18/14   Historical Provider, MD  hydrocortisone (CORTEF) 10 MG tablet Take 30 mg tablet on Sept 8th, 2017 and taper down by 10 mg daily until completed Patient not taking: Reported on 10/20/2015 09/27/15   Theodis Blaze, MD  ibuprofen (ADVIL,MOTRIN) 200 MG tablet Take 400 mg by  mouth every 6 (six) hours as needed for moderate pain.     Historical Provider, MD  levofloxacin (LEVAQUIN) 500 MG tablet Take 1 tablet (500 mg total) by mouth daily. Take tablet on Sept 8th, 2017 Patient not taking: Reported on 10/20/2015 09/28/15   Theodis Blaze, MD  lidocaine (LIDODERM) 5 % Place 1 patch onto the skin 2 (two) times daily as needed (pain).  04/04/15   Historical Provider, MD  Melatonin 3 MG TABS Take 6 mg by mouth at bedtime.  10/11/15   Historical Provider, MD  midodrine (PROAMATINE) 10 MG tablet Take 1 tablet (10 mg total) by mouth 3 (three) times daily with meals. 09/27/15   Theodis Blaze, MD  oxycodone (OXY-IR) 5 MG capsule Take 5 mg by mouth every 4 (four) hours as needed for pain.    Historical Provider, MD  silver sulfADIAZINE (SILVADENE) 1 % cream Apply 1 application topically every other day as needed (for wound care).    Historical Provider, MD  tiZANidine (ZANAFLEX) 4 MG tablet Take 4 mg by mouth every 8 (eight) hours as needed. Muscle spasms. 12/13/14   Historical Provider, MD  zinc oxide (BALMEX) 11.3 % CREA cream Apply 1 application topically 3 (three) times daily as needed (for skin redness/irritation).    Historical Provider, MD    Family History Family History  Problem Relation Age of Onset  . Hypertension Maternal Uncle     Social History Social History  Substance Use Topics  . Smoking status: Former Smoker    Packs/day: 1.00    Years: 25.00    Types: Cigarettes  . Smokeless tobacco: Never Used  . Alcohol use Yes     Comment: 2 x weekly     Allergies   Erythromycin; Nitrofurantoin monohyd macro; and Penicillins   Review of Systems Review of Systems  Unable to perform ROS: Acuity of condition     Physical Exam Updated Vital Signs BP (!) 71/41   Pulse 93   Temp 98.2 F (36.8 C) (Axillary)   Resp 13   SpO2 100%   Physical Exam  Constitutional:  Frail, ill appearing F, listless, awakens only to painful stimuli  HENT:  Head: Atraumatic.    Eyes:  Initial the patient does not open her eyes spontaneously, does not track visually  Neck:  Scar from prior tracheostomy visible  Cardiovascular: Normal rate and regular rhythm.   Pulmonary/Chest: Bradypnea noted. She is in respiratory distress. She has decreased breath sounds.  Abdominal: She exhibits no distension. There is no tenderness.  Genitourinary:  Genitourinary Comments: Foley catheter in place - dark urine  Neurological: She is unresponsive. She displays atrophy. She displays no tremor. She exhibits abnormal muscle tone. She displays no seizure activity.  Skin:     Psychiatric: Cognition and memory are impaired.  Nursing note and vitals reviewed.    ED Treatments / Results  Labs (all labs ordered are listed, but only abnormal results are displayed) Labs Reviewed  CULTURE,  BLOOD (ROUTINE X 2)  CULTURE, BLOOD (ROUTINE X 2)  URINE CULTURE  COMPREHENSIVE METABOLIC PANEL  CBC WITH DIFFERENTIAL/PLATELET  URINALYSIS, ROUTINE W REFLEX MICROSCOPIC (NOT AT [facility])  I-STAT CG4 LACTIC ACID, ED    EKG  EKG Interpretation  Date/Time:  Wednesday October 31 2015 14:40:58 EDT Ventricular Rate:  93 PR Interval:    QRS Duration: 72 QT Interval:  367 QTC Calculation: 457 R Axis:   51 Text Interpretation:  Sinus rhythm Nonspecific T abnrm, anterolateral leads Abnormal ekg Confirmed by Carmin Muskrat  MD ([id number]) on 10/31/2015 3:15:23 PM       Radiology No results found.  Procedures Procedures (including critical care time)  Medications Ordered in ED Medications  0.9 %  sodium chloride infusion (not administered)  naloxone (NARCAN) injection 0.4 mg (0.4 mg Nasal Given 10/31/15 1457)     Initial Impression / Assessment and Plan / ED Course  I have reviewed the triage vital signs and the nursing notes.  Pertinent labs & imaging results that were available during my care of the patient were reviewed by me and considered in my medical decision making (see chart for  details).  Clinical Course    3:07 PM Following provision of Narcan the patient's respiratory rate increased from 6-15, she awakened substantially, was answering questions appropriately, describing pain in her bilateral hands.  4:23 PM Patient now hypersomnolent again. Mother reports that the patient has had notable decrease in functionality over the past 2 days, has had difficulty with swallowing. Mother also states that she provides a cigarettes to the patient.   Update: Patient now listless again. Additional Narcan provided.  Patient now awakens again more readily. Following IVF BP 90/60.  Eval of posterior demonstrates full dermal depth ulcer w purulent discharge.  7:05 PM Blood pressure remains improved, 90s/60s. Patient remains somnolent, though she awakens with minimal stimuli, answers questions appropriately, oriented to place, self.   Final Clinical Impressions(s) / ED Diagnoses  Patient with quadriplegia presents with listlessness.  Patient has initial presentation concerning for sepsis, with altered mental status, hypotension, and with obvious full dermal depth decubitus ulcers, there is concern for source. Patient is afebrile, and hypotension is likely due at least in part to deconditioning, poor nutrition.  However, the patient did receive empiric antibiotics, broad-spectrum, fluids, 30 mL/kg, with improvement in her blood pressure.  Persistent hypotension after initial fluid resuscitation, concern for sepsis, likely cutaneous source, patient was admitted for further evaluation, management.  CRITICAL CARE Performed by: Carmin Muskrat Total critical care time: 45 minutes Critical care time was exclusive of separately billable procedures and treating other patients. Critical care was necessary to treat or prevent imminent or life-threatening deterioration. Critical care was time spent personally by me on the following activities: development of treatment plan with  patient and/or surrogate as well as nursing, discussions with consultants, evaluation of patient's response to treatment, examination of patient, obtaining history from patient or surrogate, ordering and performing treatments and interventions, ordering and review of laboratory studies, ordering and review of radiographic studies, pulse oximetry and re-evaluation of patient's condition.       Carmin Muskrat, MD 10/31/15 [phone]

## 2015-10-31 NOTE — ED Notes (Signed)
She continues to be more responsive and answers simple questions appropriately.

## 2015-10-31 NOTE — ED Notes (Signed)
She is now more responsive and moans as if in pain.

## 2015-10-31 NOTE — Progress Notes (Signed)
Pharmacy Antibiotic Note  Angela Page is a 45 y.o. female admitted on 10/31/2015  with quadriplegia presents with listlessness.  Patient has initial presentation concerning for sepsis, with altered mental status, hypotension, and with obvious full dermal depth acute his ulcers, there is concern for source  Pharmacy has been consulted for vancomycin, azactam and levofloxacin dosing for sepsis.  First doses ordered in ED.  Plan: Vancomycin 1gm IV q24h Aztreonam 2gm x1 then 1gm q8h Levofloxacin 750mg  IV q24h Follow cultures, renal function, and clinical course     Temp (24hrs), Avg:98.2 F (36.8 C), Min:98.2 F (36.8 C), Max:98.2 F (36.8 C)   Recent Labs Lab 10/31/15 1532 10/31/15 1544  WBC  --  30.2*  CREATININE  --  0.71  LATICACIDVEN 2.77*  --     Estimated Creatinine Clearance: 60.4 mL/min (by C-G formula based on SCr of 0.71 mg/dL).    Allergies  Allergen Reactions  . Erythromycin Nausea And Vomiting  . Nitrofurantoin Monohyd Macro Nausea And Vomiting  . Penicillins Hives    Has patient had a PCN reaction causing immediate rash, facial/tongue/throat swelling, SOB or lightheadedness with hypotension: Yes Has patient had a PCN reaction causing severe rash involving mucus membranes or skin necrosis: Yes Has patient had a PCN reaction that required hospitalization Yes- went to the DR Has patient had a PCN reaction occurring within the last 10 years: No-childhood allergy If all of the above answers are "NO", then may proceed with Cephalosporin use.     Antimicrobials this admission: 10/11 vancomycin >> 10/11 aztreonam >> 10/11 levaquin >>   Microbiology results: 10/11 BCx:  10/11 UCx:   Thank you for allowing pharmacy to be a part of this patient's care.  Dolly Rias RPh 10/31/2015, 5:24 PM Pager [phone]

## 2015-10-31 NOTE — ED Notes (Signed)
Bed: [id number] Expected date:  Expected time:  Means of arrival:  Comments: EMS quadriplegia/lethargy

## 2015-10-31 NOTE — ED Notes (Signed)
Dr Vanita Panda informed about elevated I stat lactic.

## 2015-10-31 NOTE — ED Notes (Signed)
Dr. Vanita Panda has just spent much time speaking with pt's. Mom.

## 2015-10-31 NOTE — H&P (Addendum)
Angela Page [id number] DOB: 1970-08-03 DOA: 10/31/2015     PCP: [facility] Specialists: none Patient coming from:   home Lives   With family    Chief Complaint: Decreased mentation confusion  HPI: Angela Page is a 45 y.o. female with medical history significant of traumatic spinal injury with paraplegia s/p traumatic fall with C2-7 posterior fusion 10/08/13 , recurrent UTI, decubitus ulcer, malnutrition, recurrent sepsis, Sick sinus syndrome status post Medtronic dual Chamber Pacemaker placement 11/03/13, Tobacco abuse     Presented with worsening confusion status decreased by mouth intake foul-smelling decubitus ulcer. Patient unable to provide her own history mother not at bedside currently history obtained through review of records and through ER provider. Apparently has been more sedated than usual at home family did endorse that patient may have possibly received more narcotics than usual. Family also states that she has had decreased by mouth intake. That they sometimes that her [place] her mother's oxygen. Her  mother occasionally provide patient with cigarettes. Mother was very concerned that patient developed lung cancer based on the fact that her father died of it and want patient to be tested extensively. An emerge department patient required Narcan 2 and responded suggestive opioid overdose contributing to oversedation  Regarding pertinent Chronic problems: Patient has recently been admitted to [facility] long  for pneumonia. On the second for September patient had CODE BLUE required defibrillation. She was treated with Aztreonam 9/2 --> 9/3 Vancomycin 9/2 --> 9/3 Maxipime 9/3 --> changed to [place] 9/7 to complete therapy until 9/8 discharged on September 8 on Levaquin. During admission CXR on September 4 showed right lower lobe atelectasis, volume loss versus underlying infection she also had left lower lobe patchy opacity versus pneumonia.  blood culture  resulting as MRSA.  Patient was treated for sepsis. Patient in the past had to be on ventilator for 3 months and status post tracheostomy but currently has healed. Patient has history of tobacco abuse and asthma. Her parents continue to give [name] some cigarettes a smoker. Patient has indwelling Foley catheter with associated urinary tract infections in the past she had had Proteus, Pseudomonas ( R to cipro, azreonam,Gent; S to Zosyn, ceftaz,cefepime) , enterococcus which was pansensative  patient has chronic hypotension with blood pressures normally 90s over 60s. She has chronic history of decubitus ulcer and required due to debridement in 2015 and has been on long-term antibiotics including doxycycline.  She status post pacemaker last time it was interrogated in 2016 she is non-pacer dependent    IN ER:  Temp (24hrs), Avg:97.6 F (36.4 C), Min:96.9 F (36.1 C), Max:98.2 F (36.8 C)  RR 10  1005 on # L  BP 94/56 Lactic acid 2.77 Cr 0.71 Alb 1.9  WBC 30.2 Hgb 11.4  Chest x-ray showed persistent changes in the bases bilaterally right slightly greater than left Following Medications were ordered in ER: Medications  0.9 %  sodium chloride infusion (1,000 mLs Intravenous New Bag/Given 10/31/15 1643)  levofloxacin (LEVAQUIN) IVPB 750 mg (750 mg Intravenous New Bag/Given 10/31/15 1843)  vancomycin (VANCOCIN) IVPB 1000 mg/200 mL premix (not administered)  levofloxacin (LEVAQUIN) IVPB 750 mg (not administered)  aztreonam (AZACTAM) 1 g in dextrose 5 % 50 mL IVPB (not administered)  naloxone (NARCAN) injection 0.4 mg (0.4 mg Nasal Given 10/31/15 1457)  sodium chloride 0.9 % bolus 1,000 mL (0 mLs Intravenous Stopped 10/31/15 1641)  naloxone (NARCAN) injection 0.4 mg (0.4 mg Intravenous Given 10/31/15 1625)  aztreonam (AZACTAM) 2 g in dextrose  5 % 50 mL IVPB (0 g Intravenous Stopped 10/31/15 1839)  vancomycin (VANCOCIN) IVPB 1000 mg/200 mL premix (1,000 mg Intravenous New Bag/Given 10/31/15 1753)     Hospitalist was called for admission for sepsis  Review of Systems:    Pertinent positives include: confusion, decreased PO intake  Constitutional:  No weight loss, night sweats, Fevers, chills, fatigue, weight loss  HEENT:  No headaches, Difficulty swallowing,Tooth/dental problems,Sore throat,  No sneezing, itching, ear ache, nasal congestion, post nasal drip,  Cardio-vascular:  No chest pain, Orthopnea, PND, anasarca, dizziness, palpitations.no Bilateral lower extremity swelling  GI:  No heartburn, indigestion, abdominal pain, nausea, vomiting, diarrhea, change in bowel habits, loss of appetite, melena, blood in stool, hematemesis Resp:  no shortness of breath at rest. No dyspnea on exertion, No excess mucus, no productive cough, No non-productive cough, No coughing up of blood.No change in color of mucus.No wheezing. Skin:  no rash or lesions. No jaundice GU:  no dysuria, change in color of urine, no urgency or frequency. No straining to urinate.  No flank pain.  Musculoskeletal:  No joint pain or no joint swelling. No decreased range of motion. No back pain.  Psych:  No change in mood or affect. No depression or anxiety. No memory loss.  Neuro: no localizing neurological complaints, no tingling, no weakness, no double vision, no gait abnormality, no slurred speech   As per HPI otherwise 10 point review of systems negative.   Past Medical History: Past Medical History:  Diagnosis Date  . Asthma   . GERD (gastroesophageal reflux disease)   . Pacemaker   . Pleurisy   . Quadriparesis [facility])    Past Surgical History:  Procedure Laterality Date  . APPENDECTOMY    . CARDIAC SURGERY    . I&D EXTREMITY  10/28/2011   Procedure: IRRIGATION AND DEBRIDEMENT EXTREMITY;  Surgeon: Tennis Must, MD;  Location: [place];  Service: Orthopedics;  Laterality: Right;  Irrigation and debridement right middle finger     Social History:  Ambulatory  bed bound     reports that she has quit smoking. Her smoking use included Cigarettes. She has a 25.00 pack-year smoking history. She has never used smokeless tobacco. She reports that she drinks alcohol. She reports that she does not use drugs.  Allergies:   Allergies  Allergen Reactions  . Erythromycin Nausea And Vomiting  . Nitrofurantoin Monohyd Macro Nausea And Vomiting  . Penicillins Hives    Has patient had a PCN reaction causing immediate rash, facial/tongue/throat swelling, SOB or lightheadedness with hypotension: Yes Has patient had a PCN reaction causing severe rash involving mucus membranes or skin necrosis: Yes Has patient had a PCN reaction that required hospitalization Yes- went to the DR Has patient had a PCN reaction occurring within the last 10 years: No-childhood allergy If all of the above answers are "NO", then may proceed with Cephalosporin use.        Family History:   Family History  Problem Relation Age of Onset  . Hypertension Maternal Uncle     Medications: Prior to Admission medications   Medication Sig Start Date End Date Taking? Authorizing Provider  acetaminophen (TYLENOL) 500 MG tablet Take 1,000 mg by mouth every 6 (six) hours as needed for moderate pain.   Yes Historical Provider, MD  albuterol (PROVENTIL HFA;VENTOLIN HFA) 108 (90 BASE) MCG/ACT inhaler Inhale 2 puffs into the lungs every 6 (six) hours as needed. For shortness of breath    Yes Historical Provider,  MD  albuterol (PROVENTIL) (2.5 MG/3ML) 0.083% nebulizer solution Take 2.5 mg by nebulization every 6 (six) hours as needed for wheezing or shortness of breath.   Yes Historical Provider, MD  baclofen (LIORESAL) 20 MG tablet Take 20 mg by mouth 3 (three) times daily.   Yes Historical Provider, MD  cholecalciferol (VITAMIN D) 1000 units tablet Take 1,000 Units by mouth daily.   Yes Historical Provider, MD  [employer] VITAMIN B12 1000 MCG tablet Take 1 tablet by mouth daily. 08/22/14  Yes Historical Provider, MD    dextromethorphan-guaiFENesin (MUCINEX DM) 30-600 MG 12hr tablet Take 1 tablet by mouth 2 (two) times daily. Patient taking differently: Take 1 tablet by mouth 2 (two) times daily as needed for cough.  03/01/15  Yes Debbe Odea, MD  doxycycline (VIBRA-TABS) 100 MG tablet Take 100 mg by mouth 2 (two) times daily.   Yes Historical Provider, MD  DULoxetine (CYMBALTA) 30 MG capsule Take 30 mg by mouth 2 (two) times daily. 01/06/15  Yes Historical Provider, MD  ENSURE (ENSURE) Take 237 mLs by mouth 2 (two) times daily.   Yes Historical Provider, MD  ferrous sulfate 325 (65 FE) MG tablet Take 1 tablet (325 mg total) by mouth 3 (three) times daily. 08/04/14  Yes Janece Canterbury, MD  gabapentin (NEURONTIN) 400 MG capsule Take 400 mg by mouth 3 (three) times daily. 10/18/14  Yes Historical Provider, MD  ibuprofen (ADVIL,MOTRIN) 200 MG tablet Take 400 mg by mouth every 6 (six) hours as needed for moderate pain.    Yes Historical Provider, MD  lidocaine (LIDODERM) 5 % Place 1 patch onto the skin 2 (two) times daily as needed (pain).  04/04/15  Yes Historical Provider, MD  Melatonin 3 MG TABS Take 6 mg by mouth at bedtime as needed (for sleep).  10/11/15  Yes Historical Provider, MD  midodrine (PROAMATINE) 5 MG tablet Take 5 mg by mouth 3 (three) times daily with meals.   Yes Historical Provider, MD  oxycodone (OXY-IR) 5 MG capsule Take 5 mg by mouth every 4 (four) hours as needed for pain.   Yes Historical Provider, MD  silver sulfADIAZINE (SILVADENE) 1 % cream Apply 1 application topically every other day as needed (for wound care).   Yes Historical Provider, MD  tiZANidine (ZANAFLEX) 4 MG tablet Take 4 mg by mouth every 8 (eight) hours as needed. Muscle spasms. 12/13/14  Yes Historical Provider, MD  zinc oxide (BALMEX) 11.3 % CREA cream Apply 1 application topically 3 (three) times daily as needed (for skin redness/irritation).   Yes Historical Provider, MD  ciprofloxacin (CIPRO) 500 MG tablet Take 1 tablet (500  mg total) by mouth 2 (two) times daily. Patient not taking: Reported on 10/31/2015 10/20/15   Fredia Sorrow, MD  hydrocortisone (CORTEF) 10 MG tablet Take 30 mg tablet on Sept 8th, 2017 and taper down by 10 mg daily until completed Patient not taking: Reported on 10/31/2015 09/27/15   Theodis Blaze, MD  levofloxacin (LEVAQUIN) 500 MG tablet Take 1 tablet (500 mg total) by mouth daily. Take tablet on Sept 8th, 2017 Patient not taking: Reported on 10/31/2015 09/28/15   Theodis Blaze, MD  midodrine (PROAMATINE) 10 MG tablet Take 1 tablet (10 mg total) by mouth 3 (three) times daily with meals. Patient not taking: Reported on 10/31/2015 09/27/15   Theodis Blaze, MD    Physical Exam: Patient Vitals for the past 24 hrs:  BP Temp Temp src Pulse Resp SpO2 Height Weight  10/31/15 1845 - - - - - -  5\' 2"  (1.575 m) 43.1 kg (95 lb)  10/31/15 1830 94/56 - - 86 10 100 % - -  10/31/15 1815 - - - 84 10 100 % - -  10/31/15 1800 (!) 89/65 - - 90 17 (!) 86 % - -  10/31/15 1745 - - - 81 10 100 % - -  10/31/15 1730 (!) 92/54 - - 94 16 100 % - -  10/31/15 1715 - - - 81 10 100 % - -  10/31/15 1700 (!) 83/48 - - 84 10 100 % - -  10/31/15 1645 - - - 95 13 100 % - -  10/31/15 1630 (!) 79/47 - - 85 12 (!) 89 % - -  10/31/15 1615 - - - 88 14 92 % - -  10/31/15 1600 (!) 81/50 - - 93 23 92 % - -  10/31/15 1545 - - - 95 11 96 % - -  10/31/15 1530 (!) 72/40 (!) 96.9 F (36.1 C) Rectal 90 13 100 % - -  10/31/15 1515 - - - 96 10 100 % - -  10/31/15 1500 (!) 74/45 - - 97 12 100 % - -  10/31/15 1445 - - - 93 11 100 % - -  10/31/15 1436 - - - - - 100 % - -  10/31/15 1434 (!) 71/41 98.2 F (36.8 C) Axillary 93 13 100 % - -  10/31/15 1430 - - - 90 15 100 % - -    1. General:  in No Acute distress 2. Psychological: Somnolent Oriented to self  3. Head/ENT:    Dry Mucous Membranes                          Head Non traumatic, neck supple                           Poor Dentition 4. SKIN:   decreased Skin turgor,  Skin clean  Dry and Large decubitus ulcer noted on the sacrum with significant discharge and foul smell      5. Heart: Regular rate and rhythm no   Murmur, Rub or gallop 6. Lungs: no wheezes some  crackles  At the bases 7. Abdomen: Soft, non-tender, Non distended 8. Lower extremities: no clubbing, cyanosis, bilateral edema 9. Neurologically patient has history of partial quadriplegia 10. MSK: Normal range of motion   body mass index is 17.38 kg/m.  Labs on Admission:   Labs on Admission: I have personally reviewed following labs and imaging studies  CBC:  Recent Labs Lab 10/31/15 1544  WBC 30.2*  NEUTROABS 27.5*  HGB 11.4*  HCT 36.0  MCV 92.1  PLT 0000000*   Basic Metabolic Panel:  Recent Labs Lab 10/31/15 1544  NA 135  K 3.5  CL 100*  CO2 24  GLUCOSE 127*  BUN 29*  CREATININE 0.71  CALCIUM 8.5*   GFR: Estimated Creatinine Clearance: 60.4 mL/min (by C-G formula based on SCr of 0.71 mg/dL). Liver Function Tests:  Recent Labs Lab 10/31/15 1544  AST 13*  ALT 8*  ALKPHOS 178*  BILITOT 0.6  PROT 5.4*  ALBUMIN 1.9*   No results for input(s): LIPASE, AMYLASE in the last 168 hours. No results for input(s): AMMONIA in the last 168 hours. Coagulation Profile: No results for input(s): INR, PROTIME in the last 168 hours. Cardiac Enzymes: No results for input(s): CKTOTAL, CKMB, CKMBINDEX, TROPONINI in the last 168  hours. BNP (last 3 results) No results for input(s): PROBNP in the last 8760 hours. HbA1C: No results for input(s): HGBA1C in the last 72 hours. CBG: No results for input(s): GLUCAP in the last 168 hours. Lipid Profile: No results for input(s): CHOL, HDL, LDLCALC, TRIG, CHOLHDL, LDLDIRECT in the last 72 hours. Thyroid Function Tests: No results for input(s): TSH, T4TOTAL, FREET4, T3FREE, THYROIDAB in the last 72 hours. Anemia Panel: No results for input(s): VITAMINB12, FOLATE, FERRITIN, TIBC, IRON, RETICCTPCT in the last 72 hours. Urine analysis:      Component Value Date/Time   COLORURINE YELLOW 10/31/2015 1501   APPEARANCEUR TURBID (A) 10/31/2015 1501   LABSPEC 1.018 10/31/2015 1501   PHURINE 6.0 10/31/2015 1501   GLUCOSEU NEGATIVE 10/31/2015 1501   HGBUR LARGE (A) 10/31/2015 1501   BILIRUBINUR NEGATIVE 10/31/2015 1501   KETONESUR NEGATIVE 10/31/2015 1501   PROTEINUR 30 (A) 10/31/2015 1501   UROBILINOGEN 0.2 10/29/2014 1521   NITRITE NEGATIVE 10/31/2015 1501   LEUKOCYTESUR LARGE (A) 10/31/2015 1501   Sepsis Labs: @LABRCNTIP (procalcitonin:4,lacticidven:4) )No results found for this or any previous visit (from the past 240 hour(s)).     UA   evidence of UTI     No results found for: HGBA1C  Estimated Creatinine Clearance: 60.4 mL/min (by C-G formula based on SCr of 0.71 mg/dL).  BNP (last 3 results) No results for input(s): PROBNP in the last 8760 hours.   ECG REPORT  Independently reviewed Rate: 93  Rhythm: Sinus rhythm ST&T Change: No acute ischemic changes   QTC 457  Filed Weights   10/31/15 1845  Weight: 43.1 kg (95 lb)     Cultures:    Component Value Date/Time   SDES URINE, RANDOM 10/20/2015 1447   SPECREQUEST NONE 10/20/2015 1447   CULT MULTIPLE SPECIES PRESENT, SUGGEST RECOLLECTION (A) 10/20/2015 1447   REPTSTATUS 10/22/2015 FINAL 10/20/2015 1447     Radiological Exams on Admission: Dg Chest Port 1 View  Result Date: 10/31/2015 CLINICAL DATA:  Respiratory distress EXAM: PORTABLE CHEST 1 VIEW COMPARISON:  09/24/2015 FINDINGS: Cardiac shadow is stable. The lungs are well aerated bilaterally and again demonstrate some bibasilar infiltrative changes. They have improved somewhat in the interval from the prior exam. Small right pleural effusion remains. No new focal abnormality is seen. IMPRESSION: Persistent changes in the bases bilaterally right slightly greater than left although some improvement is noted. A right effusion remains. Electronically Signed   By: Inez Catalina M.D.   On: 10/31/2015 15:21     Chart has been reviewed    Assessment/Plan  45 y.o. female with medical history significant of traumatic spinal injury with paraplegia s/p traumatic fall with C2-7 posterior fusion 10/08/13 , recurrent UTI, decubitus ulcer, malnutrition, recurrent sepsis, Sick sinus syndrome status post Medtronic dual Chamber Pacemaker placement 11/03/13, Tobacco abuse    Present on Admission: . Sepsis ([place]) - Admit per Sepsis protocol likely source being   UTI, decubitus ulcers  - rehydrate with 14ml/kg  - initiate broad spectrum antibiotics vancomycin Aztreonam Levaquin  -  obtain blood cultures  - Obtain serial lactic acid  - Obtain procalcitonin level  - Admit and monitor vital signs closely  -   PCCM has been consulted  Sepsis - Repeat Assessment  Performed at:    7:04  Vitals     Blood pressure 94/56, pulse 86, temperature (!) 96.9 F (36.1 C), temperature source Rectal, resp. rate 10, height 5\' 2"  (1.575 m), weight 43.1 kg (95 lb), SpO2 100 %.  Heart:  Regular rate and rhythm  Lungs:    Rales  Capillary Refill:   <2 sec  Peripheral Pulse:   Radial pulse palpable  Skin:     Pale   . Leukocytosis chronic but currently worse in the setting of sepsis . Overdose of opiate or related narcotic make sure to provide when necessary Narcan family need to be educated regarding use of opioids . Decubitus ulcer of sacral region, stage 4 ([place]) will need wound care consult probably would benefit from placement . Protein-calorie malnutrition, severe ([place]) nutritional consult check prealbumin . 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 Lower urinary tract infectious disease -as his chronic colonization patient had had in the past enterococcus Proteus and Pseudomonas will await results of urine cultures and cover broadly . Acute encephalopathy - in the setting of sepsis and narcotic overdose . Tobacco abuse- we'll need to discuss with family importance  of discontinuation of smoking for the patient . Asthma continue home medications Abnormal chest x-ray - patient has a history of persistent findings on chest x-ray. Given extensive history of tobacco abuse and abnormal CT in 2014 once patient is stable would obtain CT of the chest to evaluate for chronic lung disease or malignancy   Other plan as per orders.  DVT prophylaxis:    Lovenox     Code Status:  FULL CODE  as per family   Family Communication:   Family not  at  Bedside    Disposition Plan:     likely will need placement for rehabilitation                                                Would benefit from PT/OT eval prior to DC   ordered                      Nutrition  consulted                          Consults called: e-link monitoring for PCCM   Admission status:   inpatient       Level of care        SDU      I have spent a total of 66 min on this admission  extra time was spent to discuss case with PCCM  [place] 10/31/2015, 8:27 PM    [facility]  Pager [phone]   after 2 AM please page floor coverage PA If 7AM-7PM, please contact the day team taking care of the patient  [email or web address]  Password [email or web address]

## 2015-11-01 ENCOUNTER — Inpatient Hospital Stay (HOSPITAL_COMMUNITY): Payer: Medicaid Other

## 2015-11-01 DIAGNOSIS — A419 Sepsis, unspecified organism: Principal | ICD-10-CM

## 2015-11-01 DIAGNOSIS — Z01818 Encounter for other preprocedural examination: Secondary | ICD-10-CM

## 2015-11-01 DIAGNOSIS — E440 Moderate protein-calorie malnutrition: Secondary | ICD-10-CM | POA: Insufficient documentation

## 2015-11-01 DIAGNOSIS — J9600 Acute respiratory failure, unspecified whether with hypoxia or hypercapnia: Secondary | ICD-10-CM

## 2015-11-01 DIAGNOSIS — L89154 Pressure ulcer of sacral region, stage 4: Secondary | ICD-10-CM

## 2015-11-01 DIAGNOSIS — G9340 Encephalopathy, unspecified: Secondary | ICD-10-CM

## 2015-11-01 LAB — TSH: TSH: 0.425 u[IU]/mL (ref 0.350–4.500)

## 2015-11-01 LAB — BASIC METABOLIC PANEL
ANION GAP: 3 — AB (ref 5–15)
BUN: 17 mg/dL (ref 6–20)
CALCIUM: 7.3 mg/dL — AB (ref 8.9–10.3)
CO2: 21 mmol/L — AB (ref 22–32)
Chloride: 117 mmol/L — ABNORMAL HIGH (ref 101–111)
Creatinine, Ser: 0.38 mg/dL — ABNORMAL LOW (ref 0.44–1.00)
GFR calc Af Amer: >60 mL/min (ref >60)
GLUCOSE: 85 mg/dL (ref 65–99)
POTASSIUM: 3.3 mmol/L — AB (ref 3.5–5.1)
Sodium: 141 mmol/L (ref 135–145)

## 2015-11-01 LAB — CBC
HEMATOCRIT: 34 % — AB (ref 36.0–46.0)
HEMOGLOBIN: 11.1 g/dL — AB (ref 12.0–15.0)
MCH: 29.9 pg (ref 26.0–34.0)
MCHC: 32.6 g/dL (ref 30.0–36.0)
MCV: 91.6 fL (ref 78.0–100.0)
Platelets: 340 10*3/uL (ref 150–400)
RBC: 3.71 MIL/uL — AB (ref 3.87–5.11)
RDW: 16.6 % — ABNORMAL HIGH (ref 11.5–15.5)
WBC: 25.7 10*3/uL — ABNORMAL HIGH (ref 4.0–10.5)

## 2015-11-01 LAB — COMPREHENSIVE METABOLIC PANEL
ALBUMIN: 1.6 g/dL — AB (ref 3.5–5.0)
ALK PHOS: 147 U/L — AB (ref 38–126)
ALT: 7 U/L — ABNORMAL LOW (ref 14–54)
ANION GAP: 5 (ref 5–15)
AST: 14 U/L — ABNORMAL LOW (ref 15–41)
BUN: 21 mg/dL — ABNORMAL HIGH (ref 6–20)
CALCIUM: 7.3 mg/dL — AB (ref 8.9–10.3)
CO2: 23 mmol/L (ref 22–32)
Chloride: 113 mmol/L — ABNORMAL HIGH (ref 101–111)
Creatinine, Ser: 0.42 mg/dL — ABNORMAL LOW (ref 0.44–1.00)
GFR calc non Af Amer: >60 mL/min (ref >60)
Glucose, Bld: 57 mg/dL — ABNORMAL LOW (ref 65–99)
POTASSIUM: 2.8 mmol/L — AB (ref 3.5–5.1)
SODIUM: 141 mmol/L (ref 135–145)
Total Bilirubin: 0.3 mg/dL (ref 0.3–1.2)
Total Protein: 4.7 g/dL — ABNORMAL LOW (ref 6.5–8.1)

## 2015-11-01 LAB — CBC WITH DIFFERENTIAL/PLATELET
BASOS PCT: 0 %
Basophils Absolute: 0 10*3/uL (ref 0.0–0.1)
EOS ABS: 0 10*3/uL (ref 0.0–0.7)
EOS PCT: 0 %
HCT: 36 % (ref 36.0–46.0)
Hemoglobin: 11.4 g/dL — ABNORMAL LOW (ref 12.0–15.0)
LYMPHS ABS: 1.5 10*3/uL (ref 0.7–4.0)
Lymphocytes Relative: 5 %
MCH: 29.2 pg (ref 26.0–34.0)
MCHC: 31.7 g/dL (ref 30.0–36.0)
MCV: 92.1 fL (ref 78.0–100.0)
MONO ABS: 1.2 10*3/uL — AB (ref 0.1–1.0)
Monocytes Relative: 4 %
NEUTROS ABS: 27.5 10*3/uL — AB (ref 1.7–7.7)
NEUTROS PCT: 91 %
Platelets: 420 10*3/uL — ABNORMAL HIGH (ref 150–400)
RBC: 3.91 MIL/uL (ref 3.87–5.11)
RDW: 16.6 % — AB (ref 11.5–15.5)
WBC: 30.2 10*3/uL — ABNORMAL HIGH (ref 4.0–10.5)

## 2015-11-01 LAB — GLUCOSE, CAPILLARY
GLUCOSE-CAPILLARY: 63 mg/dL — AB (ref 65–99)
GLUCOSE-CAPILLARY: 142 mg/dL — AB (ref 65–99)
GLUCOSE-CAPILLARY: 137 mg/dL — AB (ref 65–99)
GLUCOSE-CAPILLARY: 88 mg/dL (ref 65–99)
Glucose-Capillary: 66 mg/dL (ref 65–99)
Glucose-Capillary: 91 mg/dL (ref 65–99)

## 2015-11-01 LAB — MAGNESIUM
MAGNESIUM: 1.4 mg/dL — AB (ref 1.7–2.4)
Magnesium: 1.3 mg/dL — ABNORMAL LOW (ref 1.7–2.4)

## 2015-11-01 LAB — PHOSPHORUS
PHOSPHORUS: 3.5 mg/dL (ref 2.5–4.6)
Phosphorus: 3.3 mg/dL (ref 2.5–4.6)

## 2015-11-01 LAB — PREALBUMIN

## 2015-11-01 LAB — MRSA PCR SCREENING: MRSA BY PCR: NEGATIVE

## 2015-11-01 LAB — LACTIC ACID, PLASMA: Lactic Acid, Venous: 1.2 mmol/L (ref 0.5–1.9)

## 2015-11-01 MED ORDER — IOPAMIDOL (ISOVUE-300) INJECTION 61%
75.0000 mL | Freq: Once | INTRAVENOUS | Status: AC | PRN
  Administered 2015-11-01: 75 mL via INTRAVENOUS

## 2015-11-01 MED ORDER — MIDAZOLAM HCL 2 MG/2ML IJ SOLN
INTRAMUSCULAR | Status: AC
  Administered 2015-11-02
  Filled 2015-11-01: qty 4

## 2015-11-01 MED ORDER — ACETAMINOPHEN 650 MG RE SUPP
650.0000 mg | Freq: Four times a day (QID) | RECTAL | Status: DC | PRN
  Administered 2015-11-01: 650 mg via RECTAL
  Filled 2015-11-01: qty 1

## 2015-11-01 MED ORDER — DEXTROSE 50 % IV SOLN
INTRAVENOUS | Status: AC
  Filled 2015-11-01: qty 50

## 2015-11-01 MED ORDER — SODIUM CHLORIDE 0.9 % IV BOLUS (SEPSIS): 1000.0000 mL | Freq: Once | INTRAVENOUS | Status: DC

## 2015-11-01 MED ORDER — ENOXAPARIN SODIUM 40 MG/0.4ML ~~LOC~~ SOLN
40.0000 mg | Freq: Every day | SUBCUTANEOUS | Status: DC
  Administered 2015-11-01 – 2015-11-19 (×18): 40 mg via SUBCUTANEOUS
  Filled 2015-11-01 (×17): qty 0.4

## 2015-11-01 MED ORDER — SODIUM CHLORIDE 0.9 % IV SOLN
25.0000 ug/h | INTRAVENOUS | Status: DC
  Administered 2015-11-02: 200 ug/h via INTRAVENOUS
  Administered 2015-11-02 – 2015-11-04 (×2): 50 ug/h via INTRAVENOUS
  Filled 2015-11-01 (×5): qty 50

## 2015-11-01 MED ORDER — POTASSIUM CHLORIDE 10 MEQ/100ML IV SOLN
10.0000 meq | INTRAVENOUS | Status: AC
  Administered 2015-11-01 (×4): 10 meq via INTRAVENOUS
  Filled 2015-11-01 (×4): qty 100

## 2015-11-01 MED ORDER — DEXTROSE 50 % IV SOLN
25.0000 mL | Freq: Once | INTRAVENOUS | Status: AC
  Administered 2015-11-01: 25 mL via INTRAVENOUS

## 2015-11-01 MED ORDER — FENTANYL CITRATE (PF) 100 MCG/2ML IJ SOLN
50.0000 ug | Freq: Once | INTRAMUSCULAR | Status: AC
  Administered 2015-11-02: 50 ug via INTRAVENOUS

## 2015-11-01 MED ORDER — ACETAMINOPHEN 500 MG PO TABS
1000.0000 mg | ORAL_TABLET | Freq: Four times a day (QID) | ORAL | Status: DC | PRN
  Administered 2015-11-01: 1000 mg via ORAL
  Filled 2015-11-01: qty 2

## 2015-11-01 MED ORDER — MAGNESIUM SULFATE 4 GM/100ML IV SOLN
4.0000 g | Freq: Once | INTRAVENOUS | Status: AC
  Administered 2015-11-01: 4 g via INTRAVENOUS
  Filled 2015-11-01: qty 100

## 2015-11-01 MED ORDER — SODIUM CHLORIDE 0.9 % IV BOLUS (SEPSIS)
500.0000 mL | Freq: Once | INTRAVENOUS | Status: AC
  Administered 2015-11-01: 500 mL via INTRAVENOUS

## 2015-11-01 MED ORDER — KETOROLAC TROMETHAMINE 15 MG/ML IJ SOLN
15.0000 mg | Freq: Three times a day (TID) | INTRAMUSCULAR | Status: DC | PRN
  Administered 2015-11-01 (×2): 15 mg via INTRAVENOUS
  Filled 2015-11-01 (×2): qty 1

## 2015-11-01 MED ORDER — PHENYLEPHRINE HCL 10 MG/ML IJ SOLN
30.0000 ug/min | INTRAVENOUS | Status: DC
  Administered 2015-11-01: 30 ug/min via INTRAVENOUS
  Administered 2015-11-02: 20 ug/min via INTRAVENOUS
  Filled 2015-11-01 (×3): qty 1

## 2015-11-01 MED ORDER — FENTANYL CITRATE (PF) 100 MCG/2ML IJ SOLN
INTRAMUSCULAR | Status: AC
  Filled 2015-11-01: qty 4

## 2015-11-01 MED ORDER — TRAMADOL HCL 50 MG PO TABS: 50.0000 mg | ORAL_TABLET | Freq: Four times a day (QID) | ORAL | Status: DC | PRN

## 2015-11-01 MED ORDER — MIDODRINE HCL 5 MG PO TABS
10.0000 mg | ORAL_TABLET | Freq: Three times a day (TID) | ORAL | Status: DC
  Administered 2015-11-01 – 2015-11-27 (×76): 10 mg via ORAL
  Filled 2015-11-01 (×78): qty 2

## 2015-11-01 MED ORDER — POTASSIUM CHLORIDE CRYS ER 20 MEQ PO TBCR: 40.0000 meq | EXTENDED_RELEASE_TABLET | Freq: Once | ORAL | Status: DC

## 2015-11-01 MED ORDER — SODIUM CHLORIDE 0.9 % IV SOLN
500.0000 mg | Freq: Four times a day (QID) | INTRAVENOUS | Status: DC
  Administered 2015-11-02 – 2015-11-07 (×22): 500 mg via INTRAVENOUS
  Filled 2015-11-01 (×23): qty 500

## 2015-11-01 MED ORDER — PROMETHAZINE HCL 25 MG/ML IJ SOLN
12.5000 mg | Freq: Once | INTRAMUSCULAR | Status: AC
  Administered 2015-11-01: 12.5 mg via INTRAVENOUS
  Filled 2015-11-01: qty 1

## 2015-11-01 MED ORDER — DEXTROSE 50 % IV SOLN
INTRAVENOUS | Status: AC
  Administered 2015-11-01: 50 mL
  Filled 2015-11-01: qty 50

## 2015-11-01 MED ORDER — HYDROCORTISONE NA SUCCINATE PF 100 MG IJ SOLR
50.0000 mg | Freq: Four times a day (QID) | INTRAMUSCULAR | Status: DC
Start: 2015-11-02 — End: 2015-11-03
  Administered 2015-11-02 – 2015-11-03 (×7): 50 mg via INTRAVENOUS
  Filled 2015-11-01 (×7): qty 2

## 2015-11-01 MED ORDER — FLUCONAZOLE IN SODIUM CHLORIDE 400-0.9 MG/200ML-% IV SOLN
400.0000 mg | Freq: Every day | INTRAVENOUS | Status: DC
  Administered 2015-11-02 (×2): 400 mg via INTRAVENOUS
  Filled 2015-11-01 (×3): qty 200

## 2015-11-01 MED ORDER — COLLAGENASE 250 UNIT/GM EX OINT
TOPICAL_OINTMENT | Freq: Every day | CUTANEOUS | Status: DC
  Administered 2015-11-01 – 2015-11-06 (×6): via TOPICAL
  Administered 2015-11-07: 1 via TOPICAL
  Administered 2015-11-08 – 2015-11-24 (×14): via TOPICAL
  Filled 2015-11-01 (×2): qty 30

## 2015-11-01 MED ORDER — FENTANYL BOLUS VIA INFUSION
50.0000 ug | INTRAVENOUS | Status: DC | PRN
  Administered 2015-11-01: 100 ug via INTRAVENOUS
  Administered 2015-11-02 (×5): 50 ug via INTRAVENOUS
  Administered 2015-11-02: 100 ug via INTRAVENOUS
  Administered 2015-11-02 – 2015-11-03 (×3): 50 ug via INTRAVENOUS
  Filled 2015-11-01: qty 50

## 2015-11-01 MED ORDER — SODIUM CHLORIDE 0.9 % IV BOLUS (SEPSIS)
1000.0000 mL | Freq: Once | INTRAVENOUS | Status: AC
  Administered 2015-11-01: 1000 mL via INTRAVENOUS

## 2015-11-01 MED ORDER — POTASSIUM CHLORIDE 10 MEQ/100ML IV SOLN
10.0000 meq | INTRAVENOUS | Status: AC
  Administered 2015-11-01 (×3): 10 meq via INTRAVENOUS
  Filled 2015-11-01 (×3): qty 100

## 2015-11-01 MED ORDER — DEXTROSE-NACL 5-0.9 % IV SOLN
INTRAVENOUS | Status: DC
  Administered 2015-11-01 – 2015-11-03 (×3): via INTRAVENOUS

## 2015-11-01 MED ORDER — SODIUM CHLORIDE 0.9 % IV SOLN: INTRAVENOUS | Status: DC | PRN

## 2015-11-01 NOTE — Progress Notes (Addendum)
Pharmacy Antibiotic Note  Angela Page is a 45 y.o. female admitted on 10/31/2015 with UTI/HCAP.  Pharmacy has been consulted for Fluconazole/primaxin dosing.  Plan: Fluconazole 400mg  iv q24hr primaxin 500mg  iv q6hr  Height: 5\' 2"  (157.5 cm) Weight: 111 lb 1.8 oz (50.4 kg) IBW/kg (Calculated) : 50.1  Temp (24hrs), Avg:97.3 F (36.3 C), Min:96.4 F (35.8 C), Max:97.9 F (36.6 C)   Recent Labs Lab 10/31/15 1532 10/31/15 1544 10/31/15 1807 10/31/15 2234 11/01/15 0205 11/01/15 1550  WBC  --  30.2*  --   --  25.7*  --   CREATININE  --  0.71  --   --  0.42* 0.38*  LATICACIDVEN 2.77*  --  2.77* 3.2* 1.2  --     Estimated Creatinine Clearance: 70.2 mL/min (by C-G formula based on SCr of 0.38 mg/dL (L)).    Allergies  Allergen Reactions  . Erythromycin Nausea And Vomiting  . Nitrofurantoin Monohyd Macro Nausea And Vomiting  . Penicillins Hives    Has patient had a PCN reaction causing immediate rash, facial/tongue/throat swelling, SOB or lightheadedness with hypotension: Yes Has patient had a PCN reaction causing severe rash involving mucus membranes or skin necrosis: Yes Has patient had a PCN reaction that required hospitalization Yes- went to the DR Has patient had a PCN reaction occurring within the last 10 years: No-childhood allergy If all of the above answers are "NO", then may proceed with Cephalosporin use.     Antimicrobials this admission: 10/11 vancomycin >> 10/11 aztreonam >> 10/12 10/11 levaquin >> 10/12 10/12 primaxin >>  Dose adjustments this admission:   Microbiology results: 10/11 BCx:  10/11 UCx:   Thank you for allowing pharmacy to be a part of this patient's care.  Nani Skillern [place] 11/01/2015 11:14 PM

## 2015-11-01 NOTE — Progress Notes (Signed)
Hypoglycemic Event  CBG: 66 at 1108  Treatment: D50 IV 25 mL  Symptoms: None  Follow-up CBG: [id number] CBG Result:137  Possible Reasons for Event: Inadequate meal intake  Comments/MD notified: Pt has had poor PO intake since admission. MD made aware. Order added for CBG q.4.h.    [place]ley [place]

## 2015-11-01 NOTE — Progress Notes (Signed)
PROGRESS NOTE    Angela Page  [id number] DOB: 06-May-1970 DOA: 10/31/2015 PCP: PROVIDER NOT IN SYSTEM    Brief Narrative:  45 y.o. 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 - Avoid opiods that decrease blood pressure - fluid bolus this AM and will place on MIVF's  Hypoglycemia - secondary to poor oral intake - place on MIVF's    Overdose of opiate or related narcotic - Naloxone on board. Reportedly was concern in ED    Protein-calorie malnutrition, severe ([place]) - continue supplementation - place on D5 normal saline while po intake is poor    Quadriplegia, C5-C7 incomplete ([place]) - Most likely cause of ulcers.     Severe sepsis (HCC)/Leukocytosis - Source most likely UTI or sacral decubitus ulcer. - Continue current antibiotic regimen: Aztreonam, Levaquin, vancomycin    Decubitus ulcer of sacral region, stage 4 (HCC) - continue     Lower urinary tract infectious disease - continue antibiotic regimen    Acute encephalopathy - secondary to infectious etiology and soft blood pressures most likely, improving.    Tobacco abuse   Asthma - albuterol    Malnutrition of moderate degree - continue supplementation   DVT prophylaxis: Lovenox Code Status: Full Family Communication: none at bedside. Disposition Plan: pending improvement in condition   Consultants:   Per HPI PCCM consulted. Will discuss with nursing. Should PCCM not be on board then will plan on reaching out to the for  management of hypotension in lieu of active infection   Procedures: None   Antimicrobials: Aztreonam, Levaquin, Vancomycin   Subjective: Pt has no new complaints. Nursing reports that blood pressure has fluctuated and that patient has had hypoglycemia. Reportedly she is not eating well .  Objective: Vitals:   11/01/15 1100 11/01/15 1200 11/01/15 1300 11/01/15 1443  BP: (!) 82/43 (!) 86/48 (!) 76/36   Pulse: 98 95 98   Resp: 10 (!) 9 15   Temp: 97.5 F (36.4 C)     TempSrc: Core (Comment)     SpO2: 97% 98% 95% 100%  Weight:      Height:        Intake/Output Summary (Last 24 hours) at 11/01/15 1518 Last data filed at 11/01/15 1200  Gross per 24 hour  Intake          5360.42 ml  Output             1400 ml  Net          3960.42 ml   Filed Weights   10/31/15 1845 11/01/15 0547  Weight: 43.1 kg (95 lb) 50.4 kg (111 lb 1.8 oz)    Examination:  General exam: Appears calm and somnolent but arrousable, in NAD.  Respiratory system: Clear to auscultation. Respiratory effort normal. Cardiovascular system: S1 & S2 heard, RRR. No JVD, murmurs, rubs, gallops or clicks. No pedal edema. Gastrointestinal system: Abdomen is nondistended, soft and nontender. No organomegaly or masses felt. Normal bowel sounds heard. Central nervous system: Alert and oriented. But somnolent Extremities: equal tone Skin: + decubitus ulcer Psychiatry: unable to properly assess due to  somnolence and limited patient cooperation    Data Reviewed: I have personally reviewed following labs and imaging studies  CBC:  Recent Labs Lab 10/31/15 1544 11/01/15 0205  WBC 30.2* 25.7*  NEUTROABS 27.5*  --   HGB 11.4* 11.1*  HCT 36.0 34.0*  MCV 92.1 91.6  PLT 420* [id number]   Basic Metabolic Panel:  Recent Labs Lab 10/31/15 1544 10/31/15 2234 11/01/15 0205  NA 135  --  141  K 3.5  --  2.8*  CL 100*  --  113*  CO2 24  --  23  GLUCOSE 127*  --  57*  BUN 29*  --  21*  CREATININE 0.71  --  0.42*    CALCIUM 8.5*  --  7.3*  MG  --  1.3* 1.4*  PHOS  --  3.5 3.3   GFR: Estimated Creatinine Clearance: 70.2 mL/min (by C-G formula based on SCr of 0.42 mg/dL (L)). Liver Function Tests:  Recent Labs Lab 10/31/15 1544 11/01/15 0205  AST 13* 14*  ALT 8* 7*  ALKPHOS 178* 147*  BILITOT 0.6 0.3  PROT 5.4* 4.7*  ALBUMIN 1.9* 1.6*   No results for input(s): LIPASE, AMYLASE in the last 168 hours. No results for input(s): AMMONIA in the last 168 hours. Coagulation Profile:  Recent Labs Lab 10/31/15 2234  INR 1.17   Cardiac Enzymes: No results for input(s): CKTOTAL, CKMB, CKMBINDEX, TROPONINI in the last 168 hours. BNP (last 3 results) No results for input(s): PROBNP in the last 8760 hours. HbA1C: No results for input(s): HGBA1C in the last 72 hours. CBG:  Recent Labs Lab 11/01/15 0409 11/01/15 0430 11/01/15 1108 11/01/15 1157 11/01/15 1516  GLUCAP 63* 142* 66 137* 88   Lipid Profile: No results for input(s): CHOL, HDL, LDLCALC, TRIG, CHOLHDL, LDLDIRECT in the last 72 hours. Thyroid Function Tests:  Recent Labs  11/01/15 0205  TSH 0.425   Anemia Panel: No results for input(s): VITAMINB12, FOLATE, FERRITIN, TIBC, IRON, RETICCTPCT in the last 72 hours. Sepsis Labs:  Recent Labs Lab 10/31/15 1532 10/31/15 1807 10/31/15 2234 11/01/15 0205  PROCALCITON  --   --  0.24  --   LATICACIDVEN 2.77* 2.77* 3.2* 1.2    Recent Results (from the past 240 hour(s))  Urine culture     Status: None (Preliminary result)   Collection Time: 10/31/15  3:01 PM  Result Value Ref Range Status   Specimen Description URINE, RANDOM  Final   Special Requests NONE  Final   Culture   Final    CULTURE REINCUBATED FOR BETTER GROWTH Performed at [facility]    Report Status PENDING  Incomplete  Blood Culture (routine x 2)     Status: None (Preliminary result)   Collection Time: 10/31/15  3:23 PM  Result Value Ref Range Status   Specimen Description BLOOD RIGHT HAND  Final    Special Requests IN PEDIATRIC BOTTLE 3 CC  Final   Culture   Final    NO GROWTH < 24 HOURS Performed at [facility]    Report Status PENDING  Incomplete  Blood Culture (routine x 2)     Status: None (Preliminary result)   Collection Time: 10/31/15  3:41 PM  Result Value Ref Range Status   Specimen Description BLOOD RIGHT ANTECUBITAL  Final   Special Requests IN PEDIATRIC BOTTLE 2 CC  Final   Culture   Final    NO GROWTH < 24 HOURS Performed at [facility]    Report Status  PENDING  Incomplete  MRSA PCR Screening     Status: None   Collection Time: 10/31/15 10:33 PM  Result Value Ref Range Status   MRSA by PCR NEGATIVE NEGATIVE Final    Comment:        The GeneXpert MRSA Assay (FDA approved for NASAL specimens only), is one component of a comprehensive MRSA colonization surveillance program. It is not intended to diagnose MRSA infection nor to guide or monitor treatment for MRSA infections.          Radiology Studies: Dg Chest Port 1 View  Result Date: 10/31/2015 CLINICAL DATA:  Respiratory distress EXAM: PORTABLE CHEST 1 VIEW COMPARISON:  09/24/2015 FINDINGS: Cardiac shadow is stable. The lungs are well aerated bilaterally and again demonstrate some bibasilar infiltrative changes. They have improved somewhat in the interval from the prior exam. Small right pleural effusion remains. No new focal abnormality is seen. IMPRESSION: Persistent changes in the bases bilaterally right slightly greater than left although some improvement is noted. A right effusion remains. Electronically Signed   By: Inez Catalina M.D.   On: 10/31/2015 15:21        Scheduled Meds: . aztreonam  1 g Intravenous Q8H  . collagenase   Topical Daily  . DULoxetine  30 mg Oral BID  . enoxaparin (LOVENOX) injection  40 mg Subcutaneous QHS  . feeding supplement (ENSURE ENLIVE)  237 mL Oral BID  . ferrous sulfate  325 mg Oral TID  . gabapentin  400 mg Oral TID  . guaiFENesin  600 mg  Oral BID  . ipratropium-albuterol  3 mL Nebulization TID  . levofloxacin (LEVAQUIN) IV  750 mg Intravenous Q24H  . midodrine  10 mg Oral TID WC  . sodium chloride flush  3 mL Intravenous Q12H  . vancomycin  1,000 mg Intravenous Q24H   Continuous Infusions: . sodium chloride 1,000 mL (11/01/15 1200)     LOS: 1 day    Time spent: > 40 minutes    Velvet Bathe, MD [facility] Pager [phone]  If 7PM-7AM, please contact night-coverage [email or web address] Password [email or web address] 11/01/2015, 3:18 PM

## 2015-11-01 NOTE — Progress Notes (Signed)
Pt has had blood pressure ranging from 66/43- 81/33 throughout the morning. Pt take midodrine at home and reports that she did not take any yesterday. Pt also stated that her blood pressure "the top number" is usually in the 70's to 80's. Mother who is the caretaker for the patient and lives with her was also called and per mother, the patient's blood pressure is usually in the [id number] systolic. Writer spoke with pharmacy as well as MD. Per MD midodrine dose will be increased to 10mg  three times a day with parameters to call if BP less than 80 systolic. Order also added for 500cc bolus

## 2015-11-01 NOTE — Evaluation (Addendum)
Clinical/Bedside Swallow Evaluation Patient Details  Name: Angela Page MRN: [id number] Date of Birth: 09/03/1970  Today's Date: 11/01/2015 Time: SLP Start Time (ACUTE ONLY): 1300 SLP Stop Time (ACUTE ONLY): 1350 SLP Time Calculation (min) (ACUTE ONLY): 50 min  Past Medical History:  Past Medical History:  Diagnosis Date  . Asthma   . GERD (gastroesophageal reflux disease)   . Pacemaker   . Pleurisy   . Quadriparesis [facility])    Past Surgical History:  Past Surgical History:  Procedure Laterality Date  . APPENDECTOMY    . CARDIAC SURGERY    . I&D EXTREMITY  10/28/2011   Procedure: IRRIGATION AND DEBRIDEMENT EXTREMITY;  Surgeon: Tennis Must, MD;  Location: [place];  Service: Orthopedics;  Laterality: Right;  Irrigation and debridement right middle finger   HPI:  45 yo female adm to [facility] with leukocytosis.  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  Pt with h/o C2-C7 fusion and C3-C6 osteophytic changes per imaging study.  Pt resides with her caregiver mom per RN.  Recent hospitalization for pna noted; pt coded and required defibrillation.  She also has h/o vent use for 3 months and trach *removed.  Swallow evaluation ordered.    Assessment / Plan / Recommendation Clinical Impression  Pt sleepy and grossly weak but participative during SLP session.  She was noted to clear her throat slightly at baseline - concerning for secretion management.  She also only inconsistently followed directions/answer questions.  At times, pt would look off and not respond to SLP- which mom later stated has been occuring.    Minimal intake provided due to mental status, reported h/o dysphagia and pt demonstrated delay in multiple swallows with post=swallow subtle throat clearing.  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    Pt admits she can choke easily on food and drink prior to admission.     Phoned mother Angela Page who reports pt with one week  h/o dysphagia - requiring mom to remove food from mouth at times and only accepting a few bites/sips before sensing it was lodging in ?pharynx or cervical esophagus *from mom's description.  Given recent dysphagia information, recent h/o pna, and grossly weak cough, recommend npo except ice chips and needed medications crushed.    Will plan for MBS next date  - hopefully with improved mentation.  Pt may also benefit from esophagram given report of h/o GERD, sensation of refluxing and frequent belching during evaluation.     Of note, Angela Page (mother) also reports that over the last week pt has been having dysphagia.  Angela Page also reports pt has been possibly hallucinating some in the morning and "talking out of her head".    In addition, she states pt's father died from lung cancer at 74 years of age and his first symptoms included dysphagia.  SLP educated RN, pt and pt's mother to plan and all agreeable.      Aspiration Risk  Severe aspiration risk;Risk for inadequate nutrition/hydration    Diet Recommendation Ice chips PRN after oral care;NPO except meds        Other  Recommendations   mbs next date  Follow up Recommendations   tbd     Frequency and Duration min 2x/week  1 week       Prognosis Prognosis for Safe Diet Advancement: Guarded Barriers to Reach Goals: Time post onset      Swallow Study   General Date of Onset: 11/01/15 HPI: 45 yo  female adm to [facility] with leukocytosis.  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  Pt with h/o C2-C7 fusion and C3-C6 osteophytic changes per imaging study.  Pt resides with her caregiver mom per RN.  Recent hospitalization for pna noted; pt coded and required defibrillation.  She also has h/o vent use for 3 months and trach *removed.  Swallow evaluation ordered.  Type of Study: Bedside Swallow Evaluation Diet Prior to this Study: NPO Temperature Spikes Noted: No Respiratory Status: Nasal cannula History of Recent Intubation:  No Behavior/Cognition: Lethargic/Drowsy;Requires cueing;Distractible Oral Cavity Assessment: Erythema;Lesions (appearance of lesion on right upper alveolar ridge) Oral Care Completed by SLP: No Oral Cavity - Dentition: Dentures, top Self-Feeding Abilities: Total assist Patient Positioning: Upright in bed (sidelying due to pt's wounds) Baseline Vocal Quality: Low vocal intensity Volitional Cough: Weak Volitional Swallow: Able to elicit    Oral/Motor/Sensory Function Overall Oral Motor/Sensory Function:  (pt with generalized weakness, at times her tongue deviates to right *pt side lying on right)   Ice Chips Ice chips: Impaired Presentation: Spoon Oral Phase Impairments: Reduced lingual movement/coordination Pharyngeal Phase Impairments: Suspected delayed Swallow;Throat Clearing - Delayed;Multiple swallows   Thin Liquid Thin Liquid: Not tested    Nectar Thick Nectar Thick Liquid: Not tested   Honey Thick Honey Thick Liquid: Not tested   Puree Puree: Impaired Presentation: Spoon Oral Phase Impairments: Reduced lingual movement/coordination;Reduced labial seal Oral Phase Functional Implications: Prolonged oral transit Pharyngeal Phase Impairments: Suspected delayed Swallow;Multiple swallows   Solid   GO   Solid: Not tested        Claudie Fisherman, [facility] SLP [phone]   SLP paged MD with request for consideration for esophagram at 1615 on 11/01/15.  Thanks.

## 2015-11-01 NOTE — Progress Notes (Signed)
Hypoglycemic Event  CBG: 63  Treatment: D50 IV 25 mL  Symptoms: Pale  Follow-up CBG: [id number] CBG Result:142  Possible Reasons for Event: Vomiting/NPO  Comments/MD notified:    Elvenia Godden, Coralee Pesa

## 2015-11-01 NOTE — Progress Notes (Signed)
MD made aware of BP trends (last BP 70/47 (50) at 1500). Per MD 500cc bolus to be added. Continue to monitor BP, if change in mentation notify MD. Pt currently alert and oriented RASS-1, Drowsy.

## 2015-11-01 NOTE — Progress Notes (Signed)
Pt BP had dropped through out evening, MD paged and patient placed in Trendelenburg. Bolus ordered as well as placement of ART line. Pt confused so patients mother called to obtain consent and verified by Benjaman Pott RN.

## 2015-11-01 NOTE — Progress Notes (Signed)
PT Cancellation Note  Patient Details Name: Angela Page MRN: [id number] DOB: March 05, 1970   Cancelled Treatment:    Reason Eval/Treat Not Completed: PT screened, no needs identified, will sign off. Pt is quadriplegic and total care at baseline. Spoke briefly with pt who reported she does not get out of bed at home. No family present during session. Pt does not appear to have any acute PT needs at this time. Recommend nursing perform ROM exercises/positioning as able. Will sign off.    Weston Anna, MPT Pager: [phone]

## 2015-11-01 NOTE — Progress Notes (Signed)
OT Cancellation Note  Patient Details Name: Angela Page MRN: [id number] DOB: July 16, 1970   Cancelled Treatment:    Reason Eval/Treat Not Completed: OT screened, no needs identified, will sign off.  Noted from PT's note, pt is quadriplegic and needs total care.  Will sign off  Vishwa Dais 11/01/2015, 12:32 PM  Lesle Chris, OTR/L [id number] 11/01/2015

## 2015-11-01 NOTE — Care Management Note (Signed)
Case Management Note  Patient Details  Name: Angela Page MRN: [id number] Date of Birth: 1970-09-24  Subjective/Objective:         sepsis           Action/Plan: home   Expected Discharge Date:   ( unknown)               Expected Discharge Plan:  Home/Self Care  In-House Referral:     Discharge planning Services     Post Acute Care Choice:    Choice offered to:     DME Arranged:    DME Agency:     HH Arranged:    [place] Agency:     Status of Service:  In process, will continue to follow  If discussed at Long Length of Stay Meetings, dates discussed:    Additional Comments:Date:  November 01, 2015 Chart reviewed for concurrent status and case management needs. Will continue to follow the patient for status change: Discharge Planning: following for needs Expected discharge date: [id number] Velva Harman, BSN, [place], [place] Leeroy Cha, RN 11/01/2015, 10:38 AM

## 2015-11-01 NOTE — Consult Note (Addendum)
Name: Angela Page MRN: [id number] DOB: 25-Dec-1970    ADMISSION DATE:  10/31/2015 CONSULTATION DATE:  11/01/15   REFERRING MD :  Wendee Beavers, MD   CHIEF COMPLAINT:  Shock   BRIEF PATIENT DESCRIPTION: 45 yr old para with low BP  SIGNIFICANT EVENTS  10/12- called for shock  STUDIES:  CT chest 10/12>>>Occlusion of the right bronchus intermedius with associated consolidation and collapse of the right middle and lower lobes, worrisome for aspiration/aspiration pneumonia. Further evaluation with bronchoscopy could be performed as clinically indicated. 2. Extensive airspace opacities within the left lung, worse within the left lower lobe, also worrisome for areas of aspiration and/or multi focal infection. 3. Small right and trace left-sided pleural effusions - note, the right-sided pleural effusion may be at least partially loculated   HISTORY OF PRESENT ILLNESS:  44 y.o.femalewith medical history significant oftraumatic spinal injury with paraplegias/p traumatic fall with C2-7 posterior fusion 10/08/13 ,recurrent UTI, decubitus ulcer, malnutrition, recurrent sepsis, Sick sinus syndrome status post Medtronic dual Chamber Pacemaker placement 11/03/13, Tobacco abuse. Admitted recent with MRSA bacteremia and PNA. Has had res gram neg pseudomonas.  Has tolerated ceph in past and had trach in past. Had CT here with concerns PNA and small loculation rt base, currently with worsening neurstatus and low output. Called to assess.  PAST MEDICAL HISTORY :   has a past medical history of Asthma; GERD (gastroesophageal reflux disease); Pacemaker; Pleurisy; and Quadriparesis ([place]).  has a past surgical history that includes Appendectomy; Cardiac surgery; and I&D extremity (10/28/2011). Prior to Admission medications   Medication Sig Start Date End Date Taking? Authorizing Provider  acetaminophen (TYLENOL) 500 MG tablet Take 1,000 mg by mouth every 6 (six) hours as needed for moderate pain.   Yes  Historical Provider, MD  albuterol (PROVENTIL HFA;VENTOLIN HFA) 108 (90 BASE) MCG/ACT inhaler Inhale 2 puffs into the lungs every 6 (six) hours as needed. For shortness of breath    Yes Historical Provider, MD  albuterol (PROVENTIL) (2.5 MG/3ML) 0.083% nebulizer solution Take 2.5 mg by nebulization every 6 (six) hours as needed for wheezing or shortness of breath.   Yes Historical Provider, MD  baclofen (LIORESAL) 20 MG tablet Take 20 mg by mouth 3 (three) times daily.   Yes Historical Provider, MD  cholecalciferol (VITAMIN D) 1000 units tablet Take 1,000 Units by mouth daily.   Yes Historical Provider, MD  [employer] VITAMIN B12 1000 MCG tablet Take 1 tablet by mouth daily. 08/22/14  Yes Historical Provider, MD  dextromethorphan-guaiFENesin (MUCINEX DM) 30-600 MG 12hr tablet Take 1 tablet by mouth 2 (two) times daily. Patient taking differently: Take 1 tablet by mouth 2 (two) times daily as needed for cough.  03/01/15  Yes Debbe Odea, MD  doxycycline (VIBRA-TABS) 100 MG tablet Take 100 mg by mouth 2 (two) times daily.   Yes Historical Provider, MD  DULoxetine (CYMBALTA) 30 MG capsule Take 30 mg by mouth 2 (two) times daily. 01/06/15  Yes Historical Provider, MD  ENSURE (ENSURE) Take 237 mLs by mouth 2 (two) times daily.   Yes Historical Provider, MD  ferrous sulfate 325 (65 FE) MG tablet Take 1 tablet (325 mg total) by mouth 3 (three) times daily. 08/04/14  Yes Janece Canterbury, MD  gabapentin (NEURONTIN) 400 MG capsule Take 400 mg by mouth 3 (three) times daily. 10/18/14  Yes Historical Provider, MD  ibuprofen (ADVIL,MOTRIN) 200 MG tablet Take 400 mg by mouth every 6 (six) hours as needed for moderate pain.    Yes Historical Provider, MD  lidocaine (LIDODERM) 5 % Place 1 patch onto the skin 2 (two) times daily as needed (pain).  04/04/15  Yes Historical Provider, MD  Melatonin 3 MG TABS Take 6 mg by mouth at bedtime as needed (for sleep).  10/11/15  Yes Historical Provider, MD  midodrine (PROAMATINE) 5 MG tablet  Take 5 mg by mouth 3 (three) times daily with meals.   Yes Historical Provider, MD  oxycodone (OXY-IR) 5 MG capsule Take 5 mg by mouth every 4 (four) hours as needed for pain.   Yes Historical Provider, MD  silver sulfADIAZINE (SILVADENE) 1 % cream Apply 1 application topically every other day as needed (for wound care).   Yes Historical Provider, MD  tiZANidine (ZANAFLEX) 4 MG tablet Take 4 mg by mouth every 8 (eight) hours as needed. Muscle spasms. 12/13/14  Yes Historical Provider, MD  zinc oxide (BALMEX) 11.3 % CREA cream Apply 1 application topically 3 (three) times daily as needed (for skin redness/irritation).   Yes Historical Provider, MD  ciprofloxacin (CIPRO) 500 MG tablet Take 1 tablet (500 mg total) by mouth 2 (two) times daily. Patient not taking: Reported on 10/31/2015 10/20/15   Fredia Sorrow, MD  hydrocortisone (CORTEF) 10 MG tablet Take 30 mg tablet on Sept 8th, 2017 and taper down by 10 mg daily until completed Patient not taking: Reported on 10/31/2015 09/27/15   Theodis Blaze, MD  levofloxacin (LEVAQUIN) 500 MG tablet Take 1 tablet (500 mg total) by mouth daily. Take tablet on Sept 8th, 2017 Patient not taking: Reported on 10/31/2015 09/28/15   Theodis Blaze, MD  midodrine (PROAMATINE) 10 MG tablet Take 1 tablet (10 mg total) by mouth 3 (three) times daily with meals. Patient not taking: Reported on 10/31/2015 09/27/15   Theodis Blaze, MD   Allergies  Allergen Reactions  . Erythromycin Nausea And Vomiting  . Nitrofurantoin Monohyd Macro Nausea And Vomiting  . Penicillins Hives    Has patient had a PCN reaction causing immediate rash, facial/tongue/throat swelling, SOB or lightheadedness with hypotension: Yes Has patient had a PCN reaction causing severe rash involving mucus membranes or skin necrosis: Yes Has patient had a PCN reaction that required hospitalization Yes- went to the DR Has patient had a PCN reaction occurring within the last 10 years: No-childhood allergy If all  of the above answers are "NO", then may proceed with Cephalosporin use.     FAMILY HISTORY:  family history includes Hypertension in her maternal uncle. SOCIAL HISTORY:  reports that she has quit smoking. Her smoking use included Cigarettes. She has a 25.00 pack-year smoking history. She has never used smokeless tobacco. She reports that she drinks alcohol. She reports that she does not use drugs.  REVIEW OF SYSTEMS:   Unable, lethargic SUBJECTIVE:   VITAL SIGNS: Temp:  [96.4 F (35.8 C)-97.9 F (36.6 C)] 96.4 F (35.8 C) (10/12 2000) Pulse Rate:  [87-105] 99 (10/12 2000) Resp:  [8-31] 16 (10/12 2000) BP: (49-95)/(25-75) 68/27 (10/12 2000) SpO2:  [94 %-100 %] 95 % (10/12 2000) Weight:  [50.4 kg (111 lb 1.8 oz)] 50.4 kg (111 lb 1.8 oz) (10/12 0547)  PHYSICAL EXAMINATION: General:  Lethargic intermittent Neuro:  Moves upper ext some, perrl HEENT:  Healed trach scar Cardiovascular:  s1 s2 RRT Lungs:  Reduced bases, weak cough Abdomen:  Soft, distended, no r/g Musculoskeletal:  contracted edema mild Skin:  No rash Pressure ulcers multiple stage 4 x 5, one on cervicale, dry   Recent Labs Lab 10/31/15 1544 11/01/15  0205 11/01/15 1550  NA 135 141 141  K 3.5 2.8* 3.3*  CL 100* 113* 117*  CO2 24 23 21*  BUN 29* 21* 17  CREATININE 0.71 0.42* 0.38*  GLUCOSE 127* 57* 85    Recent Labs Lab 10/31/15 1544 11/01/15 0205  HGB 11.4* 11.1*  HCT 36.0 34.0*  WBC 30.2* 25.7*  PLT 420* 340   Ct Chest W Contrast  Result Date: 11/01/2015 CLINICAL DATA:  Abnormal chest radiograph.  Leukocytosis. History of traumatic spinal injury with tear per leisure a. History of re- recurrent UTI, decubitus ulcer, malnutrition and recurrent sepsis. EXAM: CT CHEST WITH CONTRAST TECHNIQUE: Multidetector CT imaging of the chest was performed during intravenous contrast administration. CONTRAST:  56mL ISOVUE-300 IOPAMIDOL (ISOVUE-300) INJECTION 61% COMPARISON:  Chest CT - 09/16/2013; 10/20/2009  FINDINGS: Cardiovascular: Normal heart size. Left anterior chest wall pacemaker with tips terminating within the right atrium ventricle. Coronary artery calcifications. No pericardial effusion. Although this examination was not tailored for the evaluation the pulmonary arteries, there are no discrete filling defects within the central pulmonary arterial tree to suggest central pulmonary embolism. Normal caliber of the main pulmonary artery. Mediastinum/Nodes: Mediastinal adenopathy with index left-sided paratracheal lymph node measuring 1.4 cm in greatest short axis diameter (image 46, series 2 and index precarinal lymph node measuring 1 cm (image 50, series 2). No axillary lymphadenopathy. Lungs/Pleura: There is near complete occlusion of the right bronchus intermedius secondary to presumed aspirated debris (axial image 56, series 7; coronal image 51, series 602) with near complete atelectasis / collapse of the right middle and lower lobes. The right upper lobe remains fairly well aerated. The left bronchi appear patent, however there are rather extensive consolidative airspace opacities throughout the left lung, worse within the left lower lobe with associated air bronchograms. Small, potentially loculated right-sided pleural effusion and trace left-sided pleural effusion. No pneumothorax. Upper Abdomen: Note is made of a small amounts of perihepatic ascites (image 33, series 2 Musculoskeletal: No acute or aggressive osseous abnormalities. IMPRESSION: 1. Occlusion of the right bronchus intermedius with associated consolidation and collapse of the right middle and lower lobes, worrisome for aspiration/aspiration pneumonia. Further evaluation with bronchoscopy could be performed as clinically indicated. 2. Extensive airspace opacities within the left lung, worse within the left lower lobe, also worrisome for areas of aspiration and/or multi focal infection. 3. Small right and trace left-sided pleural effusions -  note, the right-sided pleural effusion may be at least partially loculated. 4. Mediastinal adenopathy, nonspecific though presumably reactive in the setting of multi focal infection. 5. Small amount of perihepatic ascites, incompletely evaluated. Electronically Signed   By: Sandi Mariscal M.D.   On: 11/01/2015 16:43   Dg Chest Port 1 View  Result Date: 10/31/2015 CLINICAL DATA:  Respiratory distress EXAM: PORTABLE CHEST 1 VIEW COMPARISON:  09/24/2015 FINDINGS: Cardiac shadow is stable. The lungs are well aerated bilaterally and again demonstrate some bibasilar infiltrative changes. They have improved somewhat in the interval from the prior exam. Small right pleural effusion remains. No new focal abnormality is seen. IMPRESSION: Persistent changes in the bases bilaterally right slightly greater than left although some improvement is noted. A right effusion remains. Electronically Signed   By: Inez Catalina M.D.   On: 10/31/2015 15:21    ASSESSMENT / PLAN:  Hypotension - r/o sepsis related, r/o AI ( on steroids) Sepsis, urine, vs  PNA, small loculation rt base effusion, sacral ulceration, has wire pacer Chronic Hypotension on midodrine Paraplegia   -change abx to cover  pseudomonas  More aggressive, change to monother Imipenem ( res in past) -yeast on urine analysis, add diflucan -start neo, bolus limited after 5 liters -may need cvl -place a line for concern accuracy, failed radials by RT, will place fem a line -get cortisol then stress steroids empiric -repeat lactic, important for perfusion, although her urine output and mS indicate concerns neur perfusion  Ccm time 40 min   [name]. Titus Mould, MD, [place] Pgr: [facility]  Pulmonary and [place] Pager: [phone]  11/01/2015, 11:02 PM   Additional CCm time 60 min   After a line, BP low, ABg obtained, PH 7.0, worsening resp status and awakeness, ETT placed, bronch  done with totoal collaps rt main and all lobes, suctioned clear, line placed, neo started Increase MV for acidosis compensation Calling mom to update pt  Appears to be PNA as source with collapse, may need to [name] chest if clinical status does not improve, keep peep 8 for aeration,a dd chest pt and mucomysts, pxr stat and abg, sending sputm, this may be MRSA PNA again, contnued vanc, may need linazolid, get coags, get cvp I spoke to mom about goals, she is NOT realistic, consult pall care Seems aggressive measures would be futile ] [name]. Titus Mould, MD, [place] Pgr: [facility]

## 2015-11-01 NOTE — Progress Notes (Signed)
Initial Nutrition Assessment  DOCUMENTATION CODES:   Non-severe (moderate) malnutrition in context of acute illness/injury, Underweight  INTERVENTION:  - Diet advancement as medically feasible. - RD will monitor for needs if diet unable to be advanced.  - RD will follow-up 10/16.  NUTRITION DIAGNOSIS:   Inadequate oral intake related to inability to eat as evidenced by NPO status.   GOAL:   Patient will meet greater than or equal to 90% of their needs  MONITOR:   Diet advancement, Weight trends, Labs, Skin, I & O's  REASON FOR ASSESSMENT:   Malnutrition Screening Tool, Low [place], Consult Assessment of nutrition requirement/status (malnutrition)  ASSESSMENT:   45 y.o. 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 Presented with worsening confusion status decreased by mouth intake foul-smelling decubitus ulcer. Apparently has been more sedated than usual at home; family did endorse that patient may have possibly received more narcotics than usual. Family also states that she has had decreased by mouth intake. Mother was very concerned that patient developed lung cancer based on the fact that her father died of it and want patient to be tested extensively. In ED patient required Narcan 2 and responded: suggestive of opioid overdose contributing to oversedation  Pt seen for reasons outlined above. BMI indicates underweight status. IBW is based on paraplegia. Pt has been NPO since admission; Heart Healthy diet was ordered and then canceled d/t pt's current state of oversedation. Pt very drowsy during RD visit and was able to slightly nod head to simple questions; no family/visitors present at this time. Pt indicates that she is having  abdominal pain but no nausea at time of visit. No other information about to be obtained at this time.  Physical assessment shows mild fat and mild to moderate muscle wasting to upper body; no wasting to lower body at this time and no noted edema. Per chart review, CBW consistent with weight from the beginning of September and is up (18 lbs) since February.   Medications reviewed; 325 mg ferrous sulfate TID, 4 g IV Mg sulfate x1 dose today, PRN Zofran, 10 mEq IV KCl x4 runs today.  Labs reviewed; CBGs: 63 and 142 mg/dL, K: 2.8 mmol/L, Cl: 113 mmol/L, BUN: 21 mg/dL, creatinine: 0.42 mg/dL, Ca: 7.3 mg/dL, Mg: 1.4 mg/dL, Alk Phos elevated but trending down.  IVF: NS @ 125 mL/hr.      Diet Order:  Diet NPO time specified Except for: Other (See Comments)  Skin:   Stage 4 pressure injuries to coccyx and bilateral buttocks, Stage 1 pressure injuries to mid, upper, bilateral back and L shoulder, Unstageable wound to cervical area.   Last BM:  Unknown  Height:   Ht Readings from Last 1 Encounters:  10/31/15 '5\' 2"'$  (1.575 m)    Weight:   Wt Readings from Last 1 Encounters:  11/01/15 111 lb 1.8 oz (50.4 kg)    Ideal Body Weight:  47.5 kg  BMI:  Body mass index is 20.32 kg/m.  Estimated Nutritional Needs:   Kcal:  1512-1765 (30-35 kcal/kg)  Protein:  85-100 grams (1.7-2 grams/kg)  Fluid:  >/= 1.6 L/day  EDUCATION NEEDS:   No education needs identified at this time    Jarome Matin, MS, RD, LDN Inpatient Clinical Dietitian Pager # [phone] After hours/weekend pager # [phone]

## 2015-11-01 NOTE — Consult Note (Addendum)
[facility] Nurse wound consult note Reason for Consult:Stage 4 pressure injury to left upper buttocks/sacrum/ Right upper buttocks, coccyx, right ischium and left ischium. Unstageable posterior neck, stage I bilateral scapulas and Bilateral shoulders Wound type:CHronic stage 4 pressure injury, unstageable, and stage I pressure ulcers Pressure Ulcer POA: Yes Measurement:See wound flow sheet Wound [id number] red Drainage (amount, consistency, odor) Moderate serosanguinous  Foul odor Periwound:IAD Dressing procedure/placement/frequency:Cleanse wounds to sacrum, coccyx, buttocks and ischiums with NS and pat gently dry.  Apply slightly moistened kerlex, dry gauze, top with foam dressing to absorb.  Change BID. To posterior neck I have ordered Santyl. To perineal region with Incontinence associated dermatitis, Cleanse with EasiCleanse skin cleanser, pat gently dry.  Apply thin layer of moisture barrier ointment (Critic Aid, purple top) twice daily.  When pt is moved out of ICU would recommend a low air loss mattress.  When pt is no longer NPO would recommend a nutritional consult. We will not follow, but will remain available to this patient, to nursing, and the medical and/or surgical teams.  Please re-consult if we need to assist further.    Fara Olden, RN-C, WTA-C Wound Treatment Associate\

## 2015-11-01 NOTE — Progress Notes (Signed)
RT attempted Arterial line insertion X's 2 and was unsuccessful.  RN to page MD.  RT to monitor and assess as needed.

## 2015-11-02 ENCOUNTER — Inpatient Hospital Stay (HOSPITAL_COMMUNITY): Payer: Medicaid Other

## 2015-11-02 DIAGNOSIS — J9601 Acute respiratory failure with hypoxia: Secondary | ICD-10-CM

## 2015-11-02 LAB — GLUCOSE, CAPILLARY
GLUCOSE-CAPILLARY: 96 mg/dL (ref 65–99)
GLUCOSE-CAPILLARY: 113 mg/dL — AB (ref 65–99)
GLUCOSE-CAPILLARY: 147 mg/dL — AB (ref 65–99)
Glucose-Capillary: 79 mg/dL (ref 65–99)
Glucose-Capillary: 137 mg/dL — ABNORMAL HIGH (ref 65–99)

## 2015-11-02 LAB — BLOOD GAS, ARTERIAL
ACID-BASE DEFICIT: 8.9 mmol/L — AB (ref 0.0–2.0)
Acid-base deficit: 10.3 mmol/L — ABNORMAL HIGH (ref 0.0–2.0)
Bicarbonate: 17.2 mmol/L — ABNORMAL LOW (ref 20.0–28.0)
Bicarbonate: 20.3 mmol/L (ref 20.0–28.0)
DRAWN BY: 308601
FIO2: 100
LHR: 30 {breaths}/min
O2 CONTENT: 2 L/min
O2 SAT: 99.3 %
O2 Saturation: 91.9 %
PATIENT TEMPERATURE: 36.4
PATIENT TEMPERATURE: 36.5
PCO2 ART: 38.8 mmHg (ref 32.0–48.0)
PEEP: 8 cmH2O
PH ART: 7.265 — AB (ref 7.350–7.450)
PO2 ART: 293 mmHg — AB (ref 83.0–108.0)
PO2 ART: 67.9 mmHg — AB (ref 83.0–108.0)
VT: 400 mL
pCO2 arterial: 69.5 mmHg (ref 32.0–48.0)
pH, Arterial: 7.088 — CL (ref 7.350–7.450)

## 2015-11-02 LAB — LACTIC ACID, PLASMA
Lactic Acid, Venous: 1.2 mmol/L (ref 0.5–1.9)
Lactic Acid, Venous: 1.8 mmol/L (ref 0.5–1.9)

## 2015-11-02 LAB — BASIC METABOLIC PANEL
Anion gap: 3 — ABNORMAL LOW (ref 5–15)
Anion gap: 4 — ABNORMAL LOW (ref 5–15)
BUN: 15 mg/dL (ref 6–20)
BUN: 16 mg/dL (ref 6–20)
CHLORIDE: 118 mmol/L — AB (ref 101–111)
CO2: 17 mmol/L — ABNORMAL LOW (ref 22–32)
CO2: 17 mmol/L — AB (ref 22–32)
CREATININE: 0.33 mg/dL — AB (ref 0.44–1.00)
Calcium: 7.3 mg/dL — ABNORMAL LOW (ref 8.9–10.3)
Calcium: 6.9 mg/dL — ABNORMAL LOW (ref 8.9–10.3)
Chloride: 119 mmol/L — ABNORMAL HIGH (ref 101–111)
Creatinine, Ser: 0.44 mg/dL (ref 0.44–1.00)
GFR calc Af Amer: >60 mL/min (ref >60)
GFR calc non Af Amer: >60 mL/min (ref >60)
GFR calc non Af Amer: >60 mL/min (ref >60)
GLUCOSE: 78 mg/dL (ref 65–99)
Glucose, Bld: 116 mg/dL — ABNORMAL HIGH (ref 65–99)
Potassium: 3.8 mmol/L (ref 3.5–5.1)
Potassium: 3.4 mmol/L — ABNORMAL LOW (ref 3.5–5.1)
Sodium: 138 mmol/L (ref 135–145)
Sodium: 140 mmol/L (ref 135–145)

## 2015-11-02 LAB — PHOSPHORUS
PHOSPHORUS: 1.9 mg/dL — AB (ref 2.5–4.6)
PHOSPHORUS: 3.1 mg/dL (ref 2.5–4.6)

## 2015-11-02 LAB — APTT: APTT: 41 s — AB (ref 24–36)

## 2015-11-02 LAB — PROTIME-INR
INR: 1.58
Prothrombin Time: 19.1 seconds — ABNORMAL HIGH (ref 11.4–15.2)

## 2015-11-02 LAB — MAGNESIUM
Magnesium: 1.9 mg/dL (ref 1.7–2.4)
Magnesium: 1.8 mg/dL (ref 1.7–2.4)

## 2015-11-02 LAB — CORTISOL: CORTISOL PLASMA: 19.7 ug/dL

## 2015-11-02 MED ORDER — ORAL CARE MOUTH RINSE
15.0000 mL | Freq: Four times a day (QID) | OROMUCOSAL | Status: DC
  Administered 2015-11-02 – 2015-11-08 (×23): 15 mL via OROMUCOSAL

## 2015-11-02 MED ORDER — VITAL HIGH PROTEIN PO LIQD
1000.0000 mL | ORAL | Status: DC
  Filled 2015-11-02: qty 1000

## 2015-11-02 MED ORDER — CHLORHEXIDINE GLUCONATE 0.12% ORAL RINSE (MEDLINE KIT)
15.0000 mL | Freq: Two times a day (BID) | OROMUCOSAL | Status: DC
  Administered 2015-11-02 – 2015-11-06 (×8): 15 mL via OROMUCOSAL

## 2015-11-02 MED ORDER — GABAPENTIN 250 MG/5ML PO SOLN
400.0000 mg | Freq: Three times a day (TID) | ORAL | Status: DC
  Administered 2015-11-02 – 2015-11-06 (×12): 400 mg via ORAL
  Filled 2015-11-02 (×13): qty 8

## 2015-11-02 MED ORDER — SODIUM CHLORIDE 0.9 % IV BOLUS (SEPSIS)
500.0000 mL | Freq: Once | INTRAVENOUS | Status: AC
  Administered 2015-11-02: 500 mL via INTRAVENOUS

## 2015-11-02 MED ORDER — FAMOTIDINE IN NACL 20-0.9 MG/50ML-% IV SOLN
20.0000 mg | Freq: Every day | INTRAVENOUS | Status: DC
  Administered 2015-11-02: 20 mg via INTRAVENOUS
  Filled 2015-11-02: qty 50

## 2015-11-02 MED ORDER — MIDAZOLAM HCL 2 MG/2ML IJ SOLN
2.0000 mg | Freq: Once | INTRAMUSCULAR | Status: AC
  Administered 2015-11-02: 2 mg via INTRAVENOUS

## 2015-11-02 MED ORDER — ACETYLCYSTEINE 20 % IN SOLN
4.0000 mL | Freq: Two times a day (BID) | RESPIRATORY_TRACT | Status: AC
Start: 2015-11-02 — End: 2015-11-03
  Administered 2015-11-02 – 2015-11-03 (×3): 4 mL via RESPIRATORY_TRACT
  Filled 2015-11-02 (×3): qty 4

## 2015-11-02 MED ORDER — IPRATROPIUM-ALBUTEROL 0.5-2.5 (3) MG/3ML IN SOLN
3.0000 mL | Freq: Four times a day (QID) | RESPIRATORY_TRACT | Status: DC
  Administered 2015-11-02 – 2015-11-26 (×96): 3 mL via RESPIRATORY_TRACT
  Filled 2015-11-02 (×95): qty 3

## 2015-11-02 MED ORDER — VITAL HIGH PROTEIN PO LIQD
1000.0000 mL | ORAL | Status: DC
  Administered 2015-11-02 – 2015-11-03 (×2): 1000 mL
  Administered 2015-11-03: 06:00:00
  Administered 2015-11-03 – 2015-11-04 (×2): 1000 mL
  Administered 2015-11-05: 11:00:00
  Filled 2015-11-02 (×4): qty 1000

## 2015-11-02 MED ORDER — ACETAMINOPHEN 160 MG/5ML PO SOLN
650.0000 mg | Freq: Four times a day (QID) | ORAL | Status: DC | PRN
  Administered 2015-11-05 – 2015-11-24 (×13): 650 mg
  Filled 2015-11-02 (×13): qty 20.3

## 2015-11-02 MED ORDER — PRO-STAT SUGAR FREE PO LIQD
30.0000 mL | Freq: Two times a day (BID) | ORAL | Status: DC
  Administered 2015-11-02: 30 mL
  Filled 2015-11-02: qty 30

## 2015-11-02 MED ORDER — SODIUM PHOSPHATES 45 MMOLE/15ML IV SOLN
20.0000 mmol | Freq: Once | INTRAVENOUS | Status: AC
  Administered 2015-11-02: 20 mmol via INTRAVENOUS
  Filled 2015-11-02: qty 6.67

## 2015-11-02 MED ORDER — PANTOPRAZOLE SODIUM 40 MG PO PACK
40.0000 mg | PACK | ORAL | Status: DC
  Administered 2015-11-02 – 2015-11-06 (×5): 40 mg
  Filled 2015-11-02 (×4): qty 20

## 2015-11-02 MED ORDER — FREE WATER
100.0000 mL | Freq: Four times a day (QID) | Status: DC
  Administered 2015-11-02 – 2015-11-05 (×12): 100 mL

## 2015-11-02 MED ORDER — POTASSIUM CHLORIDE 20 MEQ/15ML (10%) PO SOLN
40.0000 meq | Freq: Once | ORAL | Status: AC
  Administered 2015-11-02: 40 meq
  Filled 2015-11-02: qty 30

## 2015-11-02 NOTE — Progress Notes (Signed)
SLP Cancellation Note  Patient Details Name: Angela Page MRN: [id number] DOB: 08/29/1970   Cancelled treatment:       Reason Eval/Treat Not Completed: Medical issues which prohibited therapy. Pt intubated. Will sign off at this time, please reorder when appropriate.    Baruch Lewers, [name] 11/02/2015, 8:15 AM

## 2015-11-02 NOTE — Progress Notes (Signed)
Per conversation with pt. Mother, who is the pt. POA. Per mother, ex-husband can receive medical information about pt. Over the phone,  But can not make any medical decisions. They are divorced, but are still good friends.  Pt. Ex-husband will call and state that he is the husband anda ask for medical information stating they are still together. When ex-husband was at bedside he admitted that he had called from [place] this morning. Ex-husband was still wearing armband from [place] when coming to visit this morning.

## 2015-11-02 NOTE — Progress Notes (Signed)
Earlier in shift, RN paged because pt's BP was low. Pt runs low and her Midrodrine was increased on day shift. Bolus was given with little change. Pt had also received boluses on day shift. A line was attempted without success. After bolus, BP still in the 60-70 range. Per RN, pt had become more sleepy and urine output had decreased some. PCCM called and case discussed. Dr. Elsworth Soho suggested LA and if high, start Neo and call him back. Later, when looking for lab result, found a started consult note from PCCM. NP called unit and per RN, Dr. Titus Mould was on the unit and took a look at pt and he wrote orders and took over primary care of pt. KJKG, NP [facility]

## 2015-11-02 NOTE — Procedures (Signed)
Bronchoscopy Procedure Note Angela Page [id number] 07-07-1970  Procedure: Bronchoscopy Indications: Diagnostic evaluation of the airways, Obtain specimens for culture and/or other diagnostic studies and Remove secretions  Procedure Details Consent: Unable to obtain consent because of emergent medical necessity. Time Out: Verified patient identification, verified procedure, site/side was marked, verified correct patient position, special equipment/implants available, medications/allergies/relevent history reviewed, required imaging and test results available.  Performed  In preparation for procedure, patient was given 100% FiO2 and bronchoscope lubricated. Sedation: Etomidate  Airway entered and the following bronchi were examined: RUL, RML, RLL, LUL, LLL and Bronchi.   Procedures performed: Brushings performed Bronchoscope removed.  , Patient placed back on 100% FiO2 at conclusion of procedure.    Evaluation Hemodynamic Status: BP stable throughout; O2 sats: stable throughout Patient's Current Condition: stable Specimens:  Sent purulent fluid Complications: No apparent complications Patient did tolerate procedure well.   Angela Page. 11/02/2015 \  1. total collapse rt mainstem and all the way down to all lobes left, thick pus yellow, all removed wioth lavage 2. BAL RML, pus thick yellow  Angela Page. Angela Mould, MD, Angela Page Pgr: [facility]

## 2015-11-02 NOTE — Procedures (Signed)
Central Venous Catheter Insertion Procedure Note Angela Page [id number] Jul 19, 1970  Procedure: Insertion of Central Venous Catheter Indications: Assessment of intravascular volume, Drug and/or fluid administration and Frequent blood sampling  Procedure Details Consent: Risks of procedure as well as the alternatives and risks of each were explained to the (patient/caregiver).  Consent for procedure obtained. Time Out: Verified patient identification, verified procedure, site/side was marked, verified correct patient position, special equipment/implants available, medications/allergies/relevent history reviewed, required imaging and test results available.  Performed  Maximum sterile technique was used including antiseptics, cap, gloves, gown, hand hygiene, mask and sheet. Skin prep: Chlorhexidine; local anesthetic administered A antimicrobial bonded/coated triple lumen catheter was placed in the left internal jugular vein using the Seldinger technique.  Evaluation Blood flow good Complications: No apparent complications Patient did tolerate procedure well. Chest X-ray ordered to verify placement.  CXR: pending.  [name] 11/02/2015, 12:23 AM  [name] Shock  Daniel J. Titus Mould, MD, [place] Pgr: [facility]

## 2015-11-02 NOTE — Progress Notes (Signed)
PULMONARY / CRITICAL CARE MEDICINE   Name: Angela Page MRN: [id number] DOB: 10/22/1970    ADMISSION DATE:  10/31/2015 CONSULTATION DATE:  11/01/2015  REFERRING MD:  Dr. Roel Cluck, [facility]  CHIEF COMPLAINT:  Short of breath  SUBJECTIVE:  Remains on pressors.  Unable to tolerate SBT >> low Vt.  VITAL SIGNS: BP (!) 100/53   Pulse (!) 38   Temp 98.6 F (37 C) (Core (Comment))   Resp (!) 29   Ht 5\' 2"  (1.575 m)   Wt 111 lb 1.8 oz (50.4 kg)   SpO2 (!) 86%   BMI 20.32 kg/m   HEMODYNAMICS: CVP:  [6 mmHg-9 mmHg] 9 mmHg  VENTILATOR SETTINGS: Vent Mode: PRVC FiO2 (%):  [40 %-100 %] 40 % Set Rate:  [30 bmp] 30 bmp Vt Set:  [400 mL] 400 mL PEEP:  [5 cmH20-8 cmH20] 8 cmH20 Plateau Pressure:  [22 cmH20-26 cmH20] 24 cmH20  INTAKE / OUTPUT: I/O last 3 completed shifts: In: 7037 [I.V.:4737; IV Piggyback:2300] Out: 2825 [Urine:2225; Emesis/NG output:600]  PHYSICAL EXAMINATION: General:  alert Neuro: shrugs shoulders HEENT: ETT in place Cardiovascular:  regular Lungs:  B/l crackles Rt > Lt Abdomen:  Soft, non tender, decreased BS Musculoskeletal:  1+ edema, contracted Skin:  Several stage 4 ulcers >> present prior to admission  LABS:  BMET  Recent Labs Lab 11/01/15 1550 11/02/15 0031 11/02/15 0447  NA 141 138 140  K 3.3* 3.8 3.4*  CL 117* 118* 119*  CO2 21* 17* 17*  BUN 17 16 15   CREATININE 0.38* 0.33* 0.44  GLUCOSE 85 78 116*    Electrolytes  Recent Labs Lab 10/31/15 2234 11/01/15 0205 11/01/15 1550 11/02/15 0031 11/02/15 0447  CALCIUM  --  7.3* 7.3* 7.3* 6.9*  MG 1.3* 1.4*  --   --   --   PHOS 3.5 3.3  --   --   --     CBC  Recent Labs Lab 10/31/15 1544 11/01/15 0205  WBC 30.2* 25.7*  HGB 11.4* 11.1*  HCT 36.0 34.0*  PLT 420* 340    Coag's  Recent Labs Lab 10/31/15 2234 11/02/15 0447  APTT 32 41*  INR 1.17 1.58    Sepsis Markers  Recent Labs Lab 10/31/15 2234 11/01/15 0205 11/02/15 0031 11/02/15 0447  LATICACIDVEN 3.2*  1.2 1.2 1.8  PROCALCITON 0.24  --   --   --     ABG  Recent Labs Lab 11/01/15 2325 11/02/15 0035  PHART 7.088* 7.265*  PCO2ART 69.5* 38.8  PO2ART 67.9* 293*    Liver Enzymes  Recent Labs Lab 10/31/15 1544 11/01/15 0205  AST 13* 14*  ALT 8* 7*  ALKPHOS 178* 147*  BILITOT 0.6 0.3  ALBUMIN 1.9* 1.6*    Cardiac Enzymes No results for input(s): TROPONINI, PROBNP in the last 168 hours.  Glucose  Recent Labs Lab 11/01/15 1108 11/01/15 1157 11/01/15 1516 11/01/15 2036 11/02/15 0436 11/02/15 0750  GLUCAP 66 137* 88 91 79 96    Imaging Ct Chest W Contrast  Result Date: 11/01/2015 CLINICAL DATA:  Abnormal chest radiograph.  Leukocytosis. History of traumatic spinal injury with tear per leisure a. History of re- recurrent UTI, decubitus ulcer, malnutrition and recurrent sepsis. EXAM: CT CHEST WITH CONTRAST TECHNIQUE: Multidetector CT imaging of the chest was performed during intravenous contrast administration. CONTRAST:  41mL ISOVUE-300 IOPAMIDOL (ISOVUE-300) INJECTION 61% COMPARISON:  Chest CT - 09/16/2013; 10/20/2009 FINDINGS: Cardiovascular: Normal heart size. Left anterior chest wall pacemaker with tips terminating within the right atrium ventricle. Coronary  artery calcifications. No pericardial effusion. Although this examination was not tailored for the evaluation the pulmonary arteries, there are no discrete filling defects within the central pulmonary arterial tree to suggest central pulmonary embolism. Normal caliber of the main pulmonary artery. Mediastinum/Nodes: Mediastinal adenopathy with index left-sided paratracheal lymph node measuring 1.4 cm in greatest short axis diameter (image 46, series 2 and index precarinal lymph node measuring 1 cm (image 50, series 2). No axillary lymphadenopathy. Lungs/Pleura: There is near complete occlusion of the right bronchus intermedius secondary to presumed aspirated debris (axial image 56, series 7; coronal image 51, series 602)  with near complete atelectasis / collapse of the right middle and lower lobes. The right upper lobe remains fairly well aerated. The left bronchi appear patent, however there are rather extensive consolidative airspace opacities throughout the left lung, worse within the left lower lobe with associated air bronchograms. Small, potentially loculated right-sided pleural effusion and trace left-sided pleural effusion. No pneumothorax. Upper Abdomen: Note is made of a small amounts of perihepatic ascites (image 33, series 2 Musculoskeletal: No acute or aggressive osseous abnormalities. IMPRESSION: 1. Occlusion of the right bronchus intermedius with associated consolidation and collapse of the right middle and lower lobes, worrisome for aspiration/aspiration pneumonia. Further evaluation with bronchoscopy could be performed as clinically indicated. 2. Extensive airspace opacities within the left lung, worse within the left lower lobe, also worrisome for areas of aspiration and/or multi focal infection. 3. Small right and trace left-sided pleural effusions - note, the right-sided pleural effusion may be at least partially loculated. 4. Mediastinal adenopathy, nonspecific though presumably reactive in the setting of multi focal infection. 5. Small amount of perihepatic ascites, incompletely evaluated. Electronically Signed   By: Sandi Mariscal M.D.   On: 11/01/2015 16:43   Dg Chest Port 1 View  Result Date: 11/02/2015 CLINICAL DATA:  Endotracheal tube repositioning.  Initial encounter. EXAM: PORTABLE CHEST 1 VIEW COMPARISON:  Chest radiograph performed earlier today at 12:27 a.m. FINDINGS: The patient's endotracheal tube is seen ending 4 cm above the carina. An enteric tube is noted extending below the diaphragm. A small right pleural effusion is noted. Vascular congestion is noted, with increased interstitial markings, concerning for mild pulmonary edema. No pneumothorax is seen. The cardiomediastinal silhouette is  normal in size. A pacemaker is noted overlying the left chest wall, with leads ending overlying the right atrium and right ventricle. No acute osseous abnormalities are identified. Cervical spinal fusion hardware is partially imaged. IMPRESSION: 1. Endotracheal tube noted ending 4 cm above the carina. 2. Small right pleural effusion. Vascular congestion, with increased interstitial markings, concerning for mild pulmonary edema. Electronically Signed   By: Garald Balding M.D.   On: 11/02/2015 01:08   Dg Chest Port 1 View  Result Date: 11/02/2015 CLINICAL DATA:  Post intubation. EXAM: PORTABLE CHEST 1 VIEW COMPARISON:  CT yesterday at 1625 hour FINDINGS: Endotracheal tube is 5.6 cm from the carina. Enteric tube in place, tip and side-port below the diaphragm in the stomach. Worsening volume loss in the right lung with increasing basilar opacity and pleural fluid. New patchy opacities throughout the left lung. Unchanged heart size and mediastinal contours. No pneumothorax. Surgical hardware in the cervical spine is partially included. IMPRESSION: 1. Endotracheal tube 5.6 cm from the carina. Tip and side port of the enteric tube below the diaphragm. 2. Worsening lung aeration with increasing right pleural effusion and right lung volume loss. Worsening of patchy opacities throughout the left lung, suspect worsening pneumonia or atelectasis, less  likely pulmonary edema. Electronically Signed   By: Jeb Levering M.D.   On: 11/02/2015 01:04     STUDIES:  CT chest 10/12 >> occlusion of Rt bronchus intermedius, basilar consolidation, small Rt effusion Bronchoscopy 10/13 >> frank pus  CULTURES: Blood 10/11 >> Urine 10/11 >> Sputum 10/13 >>  ANTIBIOTICS: Vancomycin 10/11 >> Levaquin 10/11 >> 10/12 Aztreonam 10/11 >> 10/12 Diflucan 10/12 >> Primaxin 10/12 >>   SIGNIFICANT EVENTS: 10/11 Admit 10/13 VDRF, pressors, bronch  LINES/TUBES: Lt femoral aline 10/13 >> ETT 10/13 >> Lt IJ CVL 10/13  >>  DISCUSSION: 45 yo female from home with lethargy and respiratory distress.  Found to have infected sacral wounds (present prior to this admission).  She has hx of C2-C7 posterior fusion in September 2015 after traumatic spinal cord injury with paraplegia.  She developed progressive hypotension and respiratory failure from HCAP and septic shock.  ASSESSMENT / PLAN:  PULMONARY A: Acute hypoxic/hypercapnic respiratory failure 2nd to sepsis, HCAP. Hx of asthma. Tobacco abuse. P:   Full vent support F/u ABG, CXR Scheduled BDs, mucomyst  CARDIOVASCULAR A:  Septic shock. Hx of SSS s/p PM. P:  Pressors to keep MAP > 65  RENAL A:   Lactic acidosis. Neurogenic bladder. Hypokalemia. P:   Replace electrolytes as needed  GASTROINTESTINAL A:   Moderate protein calorie malnutrition. P:   Tube feeds Protonix for SUP  HEMATOLOGIC A:   Leukocytosis. P:  F/u CBC Lovenox for DVT prophylaxis  INFECTIOUS A:   HCAP. Decubitus ulcers. Recurrent UTI's. P:   Continue Abx Wound care  ENDOCRINE A:   Possible adrenal insufficiency. P:   Check cortisol >> solu cortef pending results  NEUROLOGIC A:   C2-C7 traumatic spinal cord injury. Acute metabolic encephalopathy. P:   RASS goal: 0 to -1 Continue cymbalta, neurontin  Goals of care >> Spoke with pt's mother over phone 10/13.  She understands her daughter has suffered through a lot over past two yrs, but still feels she has some quality of life.  I explained we need to take care of her acute issues, but also need to realized these acute issues have developed because of her chronic health issues.  As such we need to come up with a long term plan of care.  She is agreeable to palliative care consult to assist with this.  CC time 42 minutes.  Chesley Mires, MD [facility] Pulmonary/Critical Care 11/02/2015, 9:26 AM Pager:  [phone] After 3pm call: [phone]

## 2015-11-02 NOTE — Progress Notes (Signed)
Palliative medicine consult received.    Reviewed chart and discussed case with Dr. Halford Chessman.  I stopped by to meet patient and her family today.  Main goal of encounter today was to begin to build rapport.  She denied needs but did report being tired.  She is agreeable to meeting with me tomorrow.  Will f/u tomorrow.  Full consult note to follow at that time.  Micheline Rough, MD [facility] [phone]

## 2015-11-02 NOTE — Procedures (Signed)
Intubation Procedure Note Deziah Wallen [id number] 05/04/1970  Procedure: Intubation Indications: Respiratory insufficiency  Procedure Details Consent: Unable to obtain consent because of emergent medical necessity. Time Out: Verified patient identification, verified procedure, site/side was marked, verified correct patient position, special equipment/implants available, medications/allergies/relevent history reviewed, required imaging and test results available.  Performed  Maximum sterile technique was used including antiseptics, gown, hand hygiene and mask.  MAC and 3    Evaluation Hemodynamic Status: Transient hypotension treated with pressors; O2 sats: stable throughout Patient's Current Condition: stable Complications: No apparent complications Patient did tolerate procedure well. Chest X-ray ordered to verify placement.  CXR: pending.   [name] 11/02/2015

## 2015-11-02 NOTE — Progress Notes (Signed)
RT assisted MD with Bedside bronchoscopy.  Pt placed on 100% FIO2 prior to procedure.  MD instilled approximately 120cc's of sterile saline with approximately 40cc's return of thick yellow, green pus.  Pt tolerated procedure well and without complication, pt's vitals remained stable throughout.

## 2015-11-02 NOTE — Progress Notes (Signed)
Nutrition Follow-up  DOCUMENTATION CODES:   Non-severe (moderate) malnutrition in context of acute illness/injury, Underweight  INTERVENTION:  - Initiate Vital High Protein @ 15 mL/hr and increase by 10 mL every 6 hours to reach goal rate of Vital High Protein @ 55 mL/hr. At goal rate, this regimen will provide 1320 kcal (94% estimated kcal need), 116 grams of protein, and 1104 mL free water.  - Will order 100 mL free water QID (400 mL/day).  - RD will follow-up 10/16.  NUTRITION DIAGNOSIS:   Inadequate oral intake related to inability to eat as evidenced by NPO status. -ongoing  GOAL:   Patient will meet greater than or equal to 90% of their needs -unmet with TF not yet started.  MONITOR:   Vent status, TF tolerance, Weight trends, Labs, Skin, I & O's  REASON FOR ASSESSMENT:   Ventilator, Consult Enteral/tube feeding initiation and management  ASSESSMENT:   45 y.o. 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 Presented with worsening confusion status decreased by mouth intake foul-smelling decubitus ulcer. Apparently has been more sedated than usual at home; family did endorse that patient may have possibly received more narcotics than usual. Family also states that she has had decreased by mouth intake. Mother was very concerned that patient developed lung cancer based on the fact that her father died of it and want patient to be tested extensively. In ED patient required Narcan 2 and responded: suggestive of opioid overdose contributing to oversedation  10/13 New consult received for TF. Pt intubated at ~0020 today and OGT currently in place. No new weight since yesterday. Estimated protein needs increased based on wounds. Will  order TF as outlined above and follow-up on Monday.  Patient is currently intubated on ventilator support MV: 11.9 L/min Temp (24hrs), Avg:97.4 F (36.3 C), Min:96.4 F (35.8 C), Max:98.6 F (37 C) Propofol: none BP: 88/50 and MAP: 62   Medications reviewed; 50 mg IV Solu-cortef QID, PRN IV Zofran, 40 mg Protonix via OGT/day, 10 mEq IV KCl x3 runs yesterday, 40 mEq KCl via OFT x1 dose today.  Labs reviewed; K: 3.4 mmol/L, Cl: 119 mmol/L, Ca: 6.9 mg/dL.   IVF: D5-NS @ 50 mL/hr (204 kcal).  Drips: Fentanyl @ 250 mcg/hr, Neo @ 30 mcg/min.    10/12 - IBW is based on paraplegia.  - Pt has been NPO since admission; Heart Healthy diet was ordered and then canceled d/t pt's current state of oversedation.  - Pt very drowsy during RD visit and was able to slightly nod head to simple questions.  - Pt indicates that she is having abdominal pain but no nausea at time of visit.  - No other information about to be obtained at this time. - Physical assessment shows mild fat and mild to moderate muscle wasting to upper body; no wasting to lower body at this time and no noted edema.  - Per chart review, CBW consistent with weight from the beginning of September and is up (18 lbs) since February.   IVF: NS @ 125 mL/hr.    Diet Order:  Diet NPO time specified  Skin:    Stage 4 pressure injuries to coccyx and bilateral buttocks, Stage 1 pressure injuries to mid, upper, bilateral back and L shoulder, Unstageable wound to cervical area.   Last BM:  Unknown  Height:   Ht Readings from Last 1 Encounters:  10/31/15 5\' 2"  (1.575 m)    Weight:   Wt Readings from Last 1 Encounters:  11/01/15 111 lb 1.8 oz (50.4 kg)    Ideal Body Weight:  47.5 kg  BMI:  Body mass index is 20.32 kg/m.  Estimated Nutritional Needs:   Kcal:  [id number]  Protein:  100-111 (2-2.2 grams/kg)  Fluid:  >/= 1.6 L/day  EDUCATION NEEDS:   No education needs identified at this time    Jarome Matin, MS, RD,  LDN Inpatient Clinical Dietitian Pager # [phone] After hours/weekend pager # [phone]

## 2015-11-02 NOTE — Procedures (Signed)
Arterial Catheter Insertion Procedure Note Katura Gombert [id number] 07-Apr-1970  Procedure: Insertion of Arterial Catheter  Indications: Blood pressure monitoring and Frequent blood sampling  Procedure Details Consent: Unable to obtain consent because of emergent medical necessity. Time Out: Verified patient identification, verified procedure, site/side was marked, verified correct patient position, special equipment/implants available, medications/allergies/relevent history reviewed, required imaging and test results available.  Performed  Maximum sterile technique was used including antiseptics, cap, gloves, gown, hand hygiene, mask and sheet. Skin prep: Chlorhexidine; local anesthetic administered 20 gauge catheter was inserted into left femoral artery using the Seldinger technique.  Evaluation Blood flow good; BP tracing good. Complications: No apparent complications.   [name] 11/02/2015   [name] Rad failed by RT   [name]. Titus Mould, MD, [place] Pgr: [facility]

## 2015-11-02 NOTE — Progress Notes (Signed)
ET tube advance to 24 CM at the lip per MD.  Pt tolerated well, BBS present.  RT to monitor and assess as needed.

## 2015-11-03 ENCOUNTER — Inpatient Hospital Stay (HOSPITAL_COMMUNITY): Payer: Medicaid Other

## 2015-11-03 DIAGNOSIS — Z7189 Other specified counseling: Secondary | ICD-10-CM

## 2015-11-03 DIAGNOSIS — E440 Moderate protein-calorie malnutrition: Secondary | ICD-10-CM

## 2015-11-03 DIAGNOSIS — E43 Unspecified severe protein-calorie malnutrition: Secondary | ICD-10-CM

## 2015-11-03 DIAGNOSIS — Z515 Encounter for palliative care: Secondary | ICD-10-CM

## 2015-11-03 LAB — BASIC METABOLIC PANEL
Anion gap: 5 (ref 5–15)
BUN: 17 mg/dL (ref 6–20)
CHLORIDE: 118 mmol/L — AB (ref 101–111)
CO2: 16 mmol/L — ABNORMAL LOW (ref 22–32)
Calcium: 7.4 mg/dL — ABNORMAL LOW (ref 8.9–10.3)
Creatinine, Ser: 0.38 mg/dL — ABNORMAL LOW (ref 0.44–1.00)
GFR calc Af Amer: >60 mL/min (ref >60)
GFR calc non Af Amer: >60 mL/min (ref >60)
Glucose, Bld: 130 mg/dL — ABNORMAL HIGH (ref 65–99)
POTASSIUM: 3.7 mmol/L (ref 3.5–5.1)
SODIUM: 139 mmol/L (ref 135–145)

## 2015-11-03 LAB — GLUCOSE, CAPILLARY
GLUCOSE-CAPILLARY: 116 mg/dL — AB (ref 65–99)
GLUCOSE-CAPILLARY: 141 mg/dL — AB (ref 65–99)
GLUCOSE-CAPILLARY: 151 mg/dL — AB (ref 65–99)
Glucose-Capillary: 129 mg/dL — ABNORMAL HIGH (ref 65–99)
Glucose-Capillary: 134 mg/dL — ABNORMAL HIGH (ref 65–99)
Glucose-Capillary: 150 mg/dL — ABNORMAL HIGH (ref 65–99)

## 2015-11-03 LAB — BLOOD GAS, ARTERIAL
ACID-BASE DEFICIT: 9 mmol/L — AB (ref 0.0–2.0)
Bicarbonate: 15.9 mmol/L — ABNORMAL LOW (ref 20.0–28.0)
DRAWN BY: 11249
FIO2: 30
MECHVT: 400 mL
O2 SAT: 97.4 %
PATIENT TEMPERATURE: 36.2
PCO2 ART: 31.2 mmHg — AB (ref 32.0–48.0)
PEEP/CPAP: 8 cmH2O
PH ART: 7.323 — AB (ref 7.350–7.450)
PO2 ART: 96.2 mmHg (ref 83.0–108.0)
RATE: 30 resp/min

## 2015-11-03 LAB — CBC
HEMATOCRIT: 29.1 % — AB (ref 36.0–46.0)
HEMOGLOBIN: 9.8 g/dL — AB (ref 12.0–15.0)
MCH: 29.5 pg (ref 26.0–34.0)
MCHC: 33.7 g/dL (ref 30.0–36.0)
MCV: 87.7 fL (ref 78.0–100.0)
Platelets: 318 10*3/uL (ref 150–400)
RBC: 3.32 MIL/uL — AB (ref 3.87–5.11)
RDW: 16.9 % — AB (ref 11.5–15.5)
WBC: 18.2 10*3/uL — AB (ref 4.0–10.5)

## 2015-11-03 LAB — PHOSPHORUS
PHOSPHORUS: 3.1 mg/dL (ref 2.5–4.6)
Phosphorus: 2.3 mg/dL — ABNORMAL LOW (ref 2.5–4.6)

## 2015-11-03 LAB — MAGNESIUM
Magnesium: 1.9 mg/dL (ref 1.7–2.4)
Magnesium: 1.7 mg/dL (ref 1.7–2.4)

## 2015-11-03 MED ORDER — ARTIFICIAL TEARS OP OINT
TOPICAL_OINTMENT | Freq: Every evening | OPHTHALMIC | Status: DC | PRN
  Administered 2015-11-03: 04:00:00 via OPHTHALMIC
  Filled 2015-11-03: qty 3.5

## 2015-11-03 MED ORDER — HYDROCORTISONE NA SUCCINATE PF 100 MG IJ SOLR
50.0000 mg | Freq: Two times a day (BID) | INTRAMUSCULAR | Status: AC
  Administered 2015-11-04 (×3): 50 mg via INTRAVENOUS
  Filled 2015-11-03 (×3): qty 2

## 2015-11-03 NOTE — Progress Notes (Signed)
PULMONARY / CRITICAL CARE MEDICINE   Name: Angela Page MRN: [id number] DOB: [birth year]    ADMISSION DATE:  10/31/2015 CONSULTATION DATE:  11/01/2015  REFERRING MD:  Dr. Roel Cluck, [facility]  CHIEF COMPLAINT:  Short of breath  SUBJECTIVE:  Pressors have been weaned off Failing to wean off ventilator  VITAL SIGNS: BP 125/75   Pulse 82   Temp 97.2 F (36.2 C) (Core (Comment)) Comment (Src): temp foley  Resp (!) 30   Ht 5\' 2"  (1.575 m)   Wt 50.4 kg (111 lb 1.8 oz)   SpO2 100%   BMI 20.32 kg/m   HEMODYNAMICS: CVP:  [6 mmHg-14 mmHg] 14 mmHg  VENTILATOR SETTINGS: Vent Mode: PRVC FiO2 (%):  [30 %] 30 % Set Rate:  [30 bmp] 30 bmp Vt Set:  [400 mL] 400 mL PEEP:  [8 cmH20] 8 cmH20 Plateau Pressure:  [23 cmH20-26 cmH20] 26 cmH20  INTAKE / OUTPUT: I/O last 3 completed shifts: In: 5219.3 [I.V.:3735.5; NG/GT:733.8; IV Piggyback:750] Out: [id number] [Urine:1160; Emesis/NG output:600]  PHYSICAL EXAMINATION: General:  Alert on vent HEENT normocephalic atraumatic, endotracheal tube in place Pulmonary clear to auscultation bilaterally with ventilator supported breaths Cardiovascular regular rate and rhythm, no murmurs gallops or rubs Abdomen bowel sounds positive nontender nondistended Musculoskeletal contractures bilaterally, Dermatologic edema legs bilaterally, multiple skin wounds over heels, sacral area Neurologic: Awake, alert, follows commands appropriately not had appropriately, no movement lower extremities, contractures right upper extremity  LABS:  BMET  Recent Labs Lab 11/02/15 0031 11/02/15 0447 11/03/15 0355  NA 138 140 139  K 3.8 3.4* 3.7  CL 118* 119* 118*  CO2 17* 17* 16*  BUN 16 15 17   CREATININE 0.33* 0.44 0.38*  GLUCOSE 78 116* 130*    Electrolytes  Recent Labs Lab 11/02/15 0031 11/02/15 0447 11/02/15 1023 11/02/15 1700 11/03/15 0355  CALCIUM 7.3* 6.9*  --   --  7.4*  MG  --   --  1.9 1.8 1.9  PHOS  --   --  1.9* 3.1 3.1    CBC  Recent  Labs Lab 10/31/15 1544 11/01/15 0205 11/03/15 0355  WBC 30.2* 25.7* 18.2*  HGB 11.4* 11.1* 9.8*  HCT 36.0 34.0* 29.1*  PLT 420* 340 318    Coag's  Recent Labs Lab 10/31/15 2234 11/02/15 0447  APTT 32 41*  INR 1.17 1.58    Sepsis Markers  Recent Labs Lab 10/31/15 2234 11/01/15 0205 11/02/15 0031 11/02/15 0447  LATICACIDVEN 3.2* 1.2 1.2 1.8  PROCALCITON 0.24  --   --   --     ABG  Recent Labs Lab 11/01/15 2325 11/02/15 0035 11/03/15 0500  PHART 7.088* 7.265* 7.323*  PCO2ART 69.5* 38.8 31.2*  PO2ART 67.9* 293* 96.2    Liver Enzymes  Recent Labs Lab 10/31/15 1544 11/01/15 0205  AST 13* 14*  ALT 8* 7*  ALKPHOS 178* 147*  BILITOT 0.6 0.3  ALBUMIN 1.9* 1.6*    Cardiac Enzymes No results for input(s): TROPONINI, PROBNP in the last 168 hours.  Glucose  Recent Labs Lab 11/02/15 1603 11/02/15 2048 11/03/15 0057 11/03/15 0507 11/03/15 0825 11/03/15 1235  GLUCAP 137* 147* 134* 129* 116* 150*    Imaging Dg Chest Port 1 View  Result Date: 11/03/2015 CLINICAL DATA:  Healthcare associated pneumonia. EXAM: PORTABLE CHEST 1 VIEW COMPARISON:  Multiple recent previous exams. FINDINGS: 0504 hours. Endotracheal tube tip is 3.4 cm above the base of the carina. The NG tube passes into the stomach although the distal tip position is not included on  the film. Left IJ central line tip overlies the upper SVC. Left permanent pacemaker again noted. Patient is rotated to the right. Bibasilar atelectasis/infiltrate again noted, slightly progressed on the left. Small right pleural effusion stable. Prominent gastric bubble noted despite the presence of an NG tube. IMPRESSION: 1. Stable cardiopulmonary exam. 2. Prominent gastric bubble despite the presence of an NG tube. Electronically Signed   By: Misty Stanley M.D.   On: 11/03/2015 08:01     STUDIES:  CT chest 10/12 >> occlusion of Rt bronchus intermedius, basilar consolidation, small Rt effusion Bronchoscopy 10/13  >> significant purulent secretions airways  CULTURES: Blood 10/11 >> Urine 10/11 >> Acinetobacter, \yeast Sputum 10/13 >>  ANTIBIOTICS: Vancomycin 10/11 >> Levaquin 10/11 >> 10/12 Aztreonam 10/11 >> 10/12 Diflucan 10/12 >> 10/14 Primaxin 10/12 >>   SIGNIFICANT EVENTS: 10/11 Admit 10/13 VDRF, pressors, bronch  LINES/TUBES: Lt femoral aline 10/13 >> 11/03/2015 ETT 10/13 >> Lt IJ CVL 10/13 >>  DISCUSSION: 45 yo female from home with lethargy and respiratory distress.  Found to have infected sacral wounds (present prior to this admission).  She has hx of C2-C7 posterior fusion in September 2015 after traumatic spinal cord injury with paraplegia.  She developed progressive hypotension and respiratory failure from HCAP and septic shock.  ASSESSMENT / PLAN:  PULMONARY A: Acute hypoxic/hypercapnic respiratory failure 2nd to sepsis, HCAP Hx of asthma Tobacco abuse P:   Full vent support F/u ABG, CXR Scheduled BDs, mucomyst Wean ventilator daily  CARDIOVASCULAR A:  Septic shock resolved Hx of SSS s/p PM P:  Monitoring blood pressure Continue telemetry  RENAL A:   Lactic acidosis resolved Neurogenic bladder Hypokalemia P:   Monitor BMET and UOP Replace electrolytes as needed   GASTROINTESTINAL A:   Moderate protein calorie malnutrition P:   Tube feeds Protonix for SUP  HEMATOLOGIC A:   Leukocytosis P:  F/u CBC Lovenox for DVT prophylaxis  INFECTIOUS A:   HCAP Decubitus ulcers UTI's > acinetibacter P:   Continue imipenem Stop diflccan F/u cultures Wound care  ENDOCRINE A:   Unlikely to have adrenal insufficiency P:   solu cortef, wean off  NEUROLOGIC A:   C2-C7 traumatic spinal cord injury Acute metabolic encephalopathy P:   RASS goal: 0 to -1 Continue cymbalta, neurontin  Goals of care >> Per Dr. Juanetta Gosling note from 10/13: Spoke with pt's mother over phone 10/13.  She understands her daughter has suffered through a lot over past two  yrs, but still feels she has some quality of life.  I explained we need to take care of her acute issues, but also need to realized these acute issues have developed because of her chronic health issues.  As such we need to come up with a long term plan of care.  She is agreeable to palliative care consult to assist with this.  CC time 37 minutes.  Roselie Awkward, MD [facility] Pager: [phone] Cell: [phone] After 3pm or if no response, call [phone]

## 2015-11-03 NOTE — Consult Note (Signed)
Consultation Note Date: 11/03/2015   Patient Name: Angela Page  DOB: [birth year]  MRN: [id number]  Age / Sex: 45 y.o., female  PCP: Provider Not In System Referring Physician: [name], MD  Reason for Consultation: Establishing goals of care  HPI/Patient Profile: 45 y.o. female  with past medical history oftraumatic spinal injury with paraplegias/p traumatic fall with C2-7 posterior fusion 10/08/13 ,recurrent UTI, decubitus ulcer, malnutrition, recurrent sepsis, sick sinus syndrome (status post [employer] dual Chamber Pacemaker) admitted on 10/31/2015 with HCAP, sepsis, still not weaning from vent.  Palliative consulted for goals of care.   Clinical Assessment and Goals of Care: I met in conjunction with Angela Page and her mother. She remains intubated but is awake on vent, able to follow commands, and attempts to participate in conversation. Unfortunately she was frustrated by the difficulty I was having in understanding what she was trying to mouth due to ET tube being in place.  Her mother and I would often do talking and she would agree or disagree by nodding.  The most important thing to her is her family. This includes her 4 year old daughter. She has been living with her daughter and her mother here in [place]. She also has an ex-husband with whom she remains close. He does come to the hospital to visit her, and per her mother it is okay to share information with him. He is not, however, to make decisions on her behalf.  We talked about her clinical course to this point in time as well as pathways moving forward. At this time, she is invested in a care plan for full aggressive care.  Begin initial conversations that while we are working to fix acute problems, she will continue to have many chronic problems related to her long-term injury that are not going to improve and will continue to  cause complications moving forward.  SUMMARY OF RECOMMENDATIONS   - Patient is awake and alert on vent.  She attempts to mouth responses but difficult to follow due to ET tube being in place. - Explained purpose of palliative care to be to support family and assist with decision-making both short and long-term goals of care. - She is clear and her desire to continue with current therapies. She affirmed that she would want to continue with mechanical ventilation and is agreeable to a tracheostomy if it were required in the future. She has had tracheostomy before in the past. - She also affirmed desire for full aggressive care in the event that her condition were to decline. - It is my hope that we are able to have clear conversation about long-term goals if she is able to be weaned from ventilator. At this time, she tries to mouth words. Is very difficult to understand exactly what she is trying to communicate. - She did seem agreeable to talking about the fact that she has many medical issues that are not going to improve and she is going to continue to have continue complications moving forward due to the underlying chronic  problem cannot be fixed.  She wants to wait until she improves from this acute illness (hopefully able to extubate) to do so. - Her mother is her surrogate decision maker in the event that she cannot make her own decisions.  Code Status/Advance Care Planning:  Full code  Palliative Prophylaxis:   Frequent Pain Assessment  Additional Recommendations (Limitations, Scope, Preferences):  Full Scope Treatment  Psycho-social/Spiritual:   Desire for further Chaplaincy support:no  Additional Recommendations: Caregiving  Support/Resources  Prognosis:   Unable to determine  Discharge Planning: To Be Determined      Primary Diagnoses: Present on Admission: . Leukocytosis . Overdose of opiate or related narcotic . Decubitus ulcer of sacral region, stage 4 ([place]) .  Protein-calorie malnutrition, severe ([place]) . Quadriplegia, C5-C7 incomplete ([place]) . Severe sepsis ([place]) . Lower urinary tract infectious disease . Acute encephalopathy . Tobacco abuse . Asthma . Sepsis ([place])   I have reviewed the medical record, interviewed the patient and family, and examined the patient. The following aspects are pertinent.  Past Medical History:  Diagnosis Date  . Asthma   . GERD (gastroesophageal reflux disease)   . Pacemaker   . Pleurisy   . Quadriparesis [facility])    Social History   Social History  . Marital status: Divorced    Spouse name: N/A  . Number of children: N/A  . Years of education: N/A   Social History Main Topics  . Smoking status: Former Smoker    Packs/day: 1.00    Years: 25.00    Types: Cigarettes  . Smokeless tobacco: Never Used  . Alcohol use Yes     Comment: 2 x weekly  . Drug use: No  . Sexual activity: No   Other Topics Concern  . None   Social History Narrative  . None   Family History  Problem Relation Age of Onset  . Hypertension Maternal Uncle    Scheduled Meds: . acetylcysteine  4 mL Nebulization BID  . chlorhexidine gluconate (MEDLINE KIT)  15 mL Mouth Rinse BID  . collagenase   Topical Daily  . enoxaparin (LOVENOX) injection  40 mg Subcutaneous QHS  . feeding supplement (VITAL HIGH PROTEIN)  1,000 mL Per Tube Q24H  . free water  100 mL Per Tube Q6H  . gabapentin  400 mg Oral TID  . [START ON 11/04/2015] hydrocortisone sodium succinate  50 mg Intravenous Q12H  . imipenem-cilastatin  500 mg Intravenous Q6H  . ipratropium-albuterol  3 mL Nebulization Q6H  . mouth rinse  15 mL Mouth Rinse QID  . midodrine  10 mg Oral TID WC  . pantoprazole sodium  40 mg Per Tube Q24H  . sodium chloride flush  3 mL Intravenous Q12H  . vancomycin  1,000 mg Intravenous Q24H   Continuous Infusions: . dextrose 5 % and 0.9% NaCl 50 mL/hr at 11/03/15 0900  . fentaNYL infusion INTRAVENOUS 100 mcg/hr (11/03/15 0931)  .  phenylephrine (NEO-SYNEPHRINE) Adult infusion Stopped (11/02/15 1745)   PRN Meds:.Place/Maintain arterial line **AND** sodium chloride, acetaminophen (TYLENOL) oral liquid 160 mg/5 mL, albuterol, artificial tears, fentaNYL, naLOXone (NARCAN)  injection, [DISCONTINUED] ondansetron **OR** ondansetron (ZOFRAN) IV Medications Prior to Admission:  Prior to Admission medications   Medication Sig Start Date End Date Taking? Authorizing Provider  acetaminophen (TYLENOL) 500 MG tablet Take 1,000 mg by mouth every 6 (six) hours as needed for moderate pain.   Yes Historical Provider, MD  albuterol (PROVENTIL HFA;VENTOLIN HFA) 108 (90 BASE) MCG/ACT inhaler Inhale 2 puffs into the  lungs every 6 (six) hours as needed. For shortness of breath    Yes Historical Provider, MD  albuterol (PROVENTIL) (2.5 MG/3ML) 0.083% nebulizer solution Take 2.5 mg by nebulization every 6 (six) hours as needed for wheezing or shortness of breath.   Yes Historical Provider, MD  baclofen (LIORESAL) 20 MG tablet Take 20 mg by mouth 3 (three) times daily.   Yes Historical Provider, MD  cholecalciferol (VITAMIN D) 1000 units tablet Take 1,000 Units by mouth daily.   Yes Historical Provider, MD  [employer] VITAMIN B12 1000 MCG tablet Take 1 tablet by mouth daily. 08/22/14  Yes Historical Provider, MD  dextromethorphan-guaiFENesin (MUCINEX DM) 30-600 MG 12hr tablet Take 1 tablet by mouth 2 (two) times daily. Patient taking differently: Take 1 tablet by mouth 2 (two) times daily as needed for cough.  03/01/15  Yes Debbe Odea, MD  doxycycline (VIBRA-TABS) 100 MG tablet Take 100 mg by mouth 2 (two) times daily.   Yes Historical Provider, MD  DULoxetine (CYMBALTA) 30 MG capsule Take 30 mg by mouth 2 (two) times daily. 01/06/15  Yes Historical Provider, MD  ENSURE (ENSURE) Take 237 mLs by mouth 2 (two) times daily.   Yes Historical Provider, MD  ferrous sulfate 325 (65 FE) MG tablet Take 1 tablet (325 mg total) by mouth 3 (three) times daily. 08/04/14   Yes Janece Canterbury, MD  gabapentin (NEURONTIN) 400 MG capsule Take 400 mg by mouth 3 (three) times daily. 10/18/14  Yes Historical Provider, MD  ibuprofen (ADVIL,MOTRIN) 200 MG tablet Take 400 mg by mouth every 6 (six) hours as needed for moderate pain.    Yes Historical Provider, MD  lidocaine (LIDODERM) 5 % Place 1 patch onto the skin 2 (two) times daily as needed (pain).  04/04/15  Yes Historical Provider, MD  Melatonin 3 MG TABS Take 6 mg by mouth at bedtime as needed (for sleep).  10/11/15  Yes Historical Provider, MD  midodrine (PROAMATINE) 5 MG tablet Take 5 mg by mouth 3 (three) times daily with meals.   Yes Historical Provider, MD  oxycodone (OXY-IR) 5 MG capsule Take 5 mg by mouth every 4 (four) hours as needed for pain.   Yes Historical Provider, MD  silver sulfADIAZINE (SILVADENE) 1 % cream Apply 1 application topically every other day as needed (for wound care).   Yes Historical Provider, MD  tiZANidine (ZANAFLEX) 4 MG tablet Take 4 mg by mouth every 8 (eight) hours as needed. Muscle spasms. 12/13/14  Yes Historical Provider, MD  zinc oxide (BALMEX) 11.3 % CREA cream Apply 1 application topically 3 (three) times daily as needed (for skin redness/irritation).   Yes Historical Provider, MD  ciprofloxacin (CIPRO) 500 MG tablet Take 1 tablet (500 mg total) by mouth 2 (two) times daily. Patient not taking: Reported on 10/31/2015 10/20/15   Fredia Sorrow, MD  hydrocortisone (CORTEF) 10 MG tablet Take 30 mg tablet on Sept 8th, 2017 and taper down by 10 mg daily until completed Patient not taking: Reported on 10/31/2015 09/27/15   Theodis Blaze, MD  levofloxacin (LEVAQUIN) 500 MG tablet Take 1 tablet (500 mg total) by mouth daily. Take tablet on Sept 8th, 2017 Patient not taking: Reported on 10/31/2015 09/28/15   Theodis Blaze, MD  midodrine (PROAMATINE) 10 MG tablet Take 1 tablet (10 mg total) by mouth 3 (three) times daily with meals. Patient not taking: Reported on 10/31/2015 09/27/15   Theodis Blaze, MD   Allergies  Allergen Reactions  . Erythromycin Nausea  And Vomiting  . Nitrofurantoin Monohyd Macro Nausea And Vomiting  . Penicillins Hives    Has patient had a PCN reaction causing immediate rash, facial/tongue/throat swelling, SOB or lightheadedness with hypotension: Yes Has patient had a PCN reaction causing severe rash involving mucus membranes or skin necrosis: Yes Has patient had a PCN reaction that required hospitalization Yes- went to the DR Has patient had a PCN reaction occurring within the last 10 years: No-childhood allergy If all of the above answers are "NO", then may proceed with Cephalosporin use.    Review of Systems Patient intubated. While she is able to do some mouthing and nodding, it is difficult to perform full review of systems unreliable manner  Physical Exam  General:  Alert on vent HEENT normocephalic atraumatic, endotracheal tube in place Resp:  clear to auscultation bilaterally with ventilator support Cardiovascular: regular rate and rhythm, no murmurs gallops or rubs Abdomen: bowel sounds positive nontender nondistended Musculoskeletal: contracture noted bilaterally, Neurologic: Awake, alert, follows commands appropriately not had appropriately, no movement lower extremities, contractures right upper extremity   Vital Signs: BP 113/60   Pulse (!) 102   Temp 97.2 F (36.2 C) (Core (Comment)) Comment (Src): temp foley  Resp 17   Ht '5\' 2"'$  (1.575 m)   Wt 50.4 kg (111 lb 1.8 oz)   SpO2 98%   BMI 20.32 kg/m  Pain Assessment: CPOT POSS *See Group Information*: 2-Acceptable,Slightly drowsy, easily aroused Pain Score: 3    SpO2: SpO2: 98 % O2 Device:SpO2: 98 % O2 Flow Rate: .O2 Flow Rate (L/min): 2 L/min  IO: Intake/output summary:  Intake/Output Summary (Last 24 hours) at 11/03/15 1655 Last data filed at 11/03/15 0900  Gross per 24 hour  Intake          2057.92 ml  Output              520 ml  Net          1537.92 ml    LBM: Last  BM Date: 10/28/15 Baseline Weight: Weight: 43.1 kg (95 lb) Most recent weight: Weight: 50.4 kg (111 lb 1.8 oz)     Palliative Assessment/Data: 10%   Flowsheet Rows   Flowsheet Row Most Recent Value  Intake Tab  Referral Department  Critical care  Unit at Time of Referral  ICU  Palliative Care Primary Diagnosis  Sepsis/Infectious Disease  Date Notified  11/02/15  Palliative Care Type  New Palliative care  Reason for referral  Clarify Goals of Care  Date of Admission  10/31/15  Date first seen by Palliative Care  11/02/15  # of days Palliative referral response time  0 Day(s)  # of days IP prior to Palliative referral  2  Clinical Assessment  Psychosocial & Spiritual Assessment  Palliative Care Outcomes      Time In: 7116 Time Out: 1330 Time Total: 60 Greater than 50%  of this time was spent counseling and coordinating care related to the above assessment and plan.  Signed by: Micheline Rough, MD   Please contact Palliative Medicine Team phone at [phone] for questions and concerns.  For individual provider: See Shea Evans

## 2015-11-04 ENCOUNTER — Inpatient Hospital Stay (HOSPITAL_COMMUNITY): Payer: Medicaid Other

## 2015-11-04 DIAGNOSIS — N390 Urinary tract infection, site not specified: Secondary | ICD-10-CM

## 2015-11-04 DIAGNOSIS — G8254 Quadriplegia, C5-C7 incomplete: Secondary | ICD-10-CM

## 2015-11-04 DIAGNOSIS — R6520 Severe sepsis without septic shock: Secondary | ICD-10-CM

## 2015-11-04 DIAGNOSIS — J189 Pneumonia, unspecified organism: Secondary | ICD-10-CM

## 2015-11-04 LAB — CULTURE, RESPIRATORY: CULTURE: NORMAL

## 2015-11-04 LAB — CBC WITH DIFFERENTIAL/PLATELET
Basophils Absolute: 0 10*3/uL (ref 0.0–0.1)
Basophils Relative: 0 %
EOS ABS: 0 10*3/uL (ref 0.0–0.7)
EOS PCT: 0 %
HCT: 28 % — ABNORMAL LOW (ref 36.0–46.0)
HEMOGLOBIN: 9.2 g/dL — AB (ref 12.0–15.0)
LYMPHS ABS: 0.9 10*3/uL (ref 0.7–4.0)
Lymphocytes Relative: 5 %
MCH: 28.9 pg (ref 26.0–34.0)
MCHC: 32.9 g/dL (ref 30.0–36.0)
MCV: 88.1 fL (ref 78.0–100.0)
MONOS PCT: 2 %
Monocytes Absolute: 0.3 10*3/uL (ref 0.1–1.0)
Neutro Abs: 17.3 10*3/uL — ABNORMAL HIGH (ref 1.7–7.7)
Neutrophils Relative %: 93 %
PLATELETS: 258 10*3/uL (ref 150–400)
RBC: 3.18 MIL/uL — ABNORMAL LOW (ref 3.87–5.11)
RDW: 17.5 % — ABNORMAL HIGH (ref 11.5–15.5)
WBC: 18.5 10*3/uL — ABNORMAL HIGH (ref 4.0–10.5)

## 2015-11-04 LAB — GLUCOSE, CAPILLARY
GLUCOSE-CAPILLARY: 133 mg/dL — AB (ref 65–99)
GLUCOSE-CAPILLARY: 176 mg/dL — AB (ref 65–99)
Glucose-Capillary: 174 mg/dL — ABNORMAL HIGH (ref 65–99)
Glucose-Capillary: 164 mg/dL — ABNORMAL HIGH (ref 65–99)
Glucose-Capillary: 156 mg/dL — ABNORMAL HIGH (ref 65–99)
Glucose-Capillary: 154 mg/dL — ABNORMAL HIGH (ref 65–99)
Glucose-Capillary: 162 mg/dL — ABNORMAL HIGH (ref 65–99)

## 2015-11-04 LAB — BASIC METABOLIC PANEL
Anion gap: 2 — ABNORMAL LOW (ref 5–15)
BUN: 20 mg/dL (ref 6–20)
CHLORIDE: 119 mmol/L — AB (ref 101–111)
CO2: 21 mmol/L — AB (ref 22–32)
CREATININE: 0.34 mg/dL — AB (ref 0.44–1.00)
Calcium: 7.8 mg/dL — ABNORMAL LOW (ref 8.9–10.3)
GFR calc Af Amer: >60 mL/min (ref >60)
GFR calc non Af Amer: >60 mL/min (ref >60)
Glucose, Bld: 198 mg/dL — ABNORMAL HIGH (ref 65–99)
Potassium: 3 mmol/L — ABNORMAL LOW (ref 3.5–5.1)
Sodium: 142 mmol/L (ref 135–145)

## 2015-11-04 LAB — URINE CULTURE: Culture: >=100000 — AB

## 2015-11-04 LAB — CULTURE, RESPIRATORY W GRAM STAIN

## 2015-11-04 MED ORDER — POTASSIUM CHLORIDE 20 MEQ/15ML (10%) PO SOLN
40.0000 meq | Freq: Two times a day (BID) | ORAL | Status: AC
  Administered 2015-11-04 (×2): 40 meq
  Filled 2015-11-04 (×2): qty 30

## 2015-11-04 MED ORDER — DEXMEDETOMIDINE HCL IN NACL 200 MCG/50ML IV SOLN
0.4000 ug/kg/h | INTRAVENOUS | Status: DC
  Filled 2015-11-04: qty 50

## 2015-11-04 MED ORDER — FENTANYL CITRATE (PF) 100 MCG/2ML IJ SOLN
25.0000 ug | INTRAMUSCULAR | Status: DC | PRN
  Administered 2015-11-05: 50 ug via INTRAVENOUS
  Administered 2015-11-05: 75 ug via INTRAVENOUS
  Administered 2015-11-05 – 2015-11-06 (×4): 100 ug via INTRAVENOUS
  Filled 2015-11-04 (×7): qty 2

## 2015-11-04 NOTE — Progress Notes (Signed)
PULMONARY / CRITICAL CARE MEDICINE   Name: Angela Page MRN: [id number] DOB: 1970-10-22    ADMISSION DATE:  10/31/2015 CONSULTATION DATE:  11/01/2015  REFERRING MD:  Dr. Roel Cluck, [facility]  CHIEF COMPLAINT:  Short of breath  SUBJECTIVE:  Pressors off Met with palliative medicine yesterday  VITAL SIGNS: BP 99/66   Pulse 89   Temp 97.3 F (36.3 C) (Core (Comment))   Resp 16   Ht _0  (1.575 m)   Wt 59.4 kg (130 lb 15.3 oz)   SpO2 100%   BMI 23.95 kg/m   HEMODYNAMICS: CVP:  [8 mmHg-14 mmHg] 8 mmHg  VENTILATOR SETTINGS: Vent Mode: PRVC FiO2 (%):  [30 %] 30 % Set Rate:  [16 bmp-30 bmp] 16 bmp Vt Set:  [400 mL] 400 mL PEEP:  [5 cmH20-8 cmH20] 5 cmH20 Plateau Pressure:  [13 cmH20-26 cmH20] 20 cmH20  INTAKE / OUTPUT: I/O last 3 completed shifts: In: 4098 [I.V.:2110.2; NG/GT:1873.8; IV Piggyback:600] Out: 1125 [Urine:1125]  PHYSICAL EXAMINATION: General:  Alert on vent HEENT normocephalic atraumatic, endotracheal tube in place Pulmonary clear to auscultation bilaterally with ventilator supported breaths Cardiovascular regular rate and rhythm, no murmurs gallops or rubs Abdomen bowel sounds positive nontender nondistended Musculoskeletal contractures bilaterally, Dermatologic edema legs bilaterally, multiple skin wounds over heels, sacral area Neurologic: Awake, alert, follows commands appropriately not had appropriately, no movement lower extremities, contractures right upper extremity  LABS:  BMET  Recent Labs Lab 11/02/15 0447 11/03/15 0355 11/04/15 0431  NA 140 139 142  K 3.4* 3.7 3.0*  CL 119* 118* 119*  CO2 17* 16* 21*  BUN _1 CREATININE 0.44 0.38* 0.34*  GLUCOSE 116* 130* 198*    Electrolytes  Recent Labs Lab 11/02/15 0447  11/02/15 1700 11/03/15 0355 11/03/15 1720 11/04/15 0431  CALCIUM 6.9*  --   --  7.4*  --  7.8*  MG  --   < > 1.8 1.9 1.7  --   PHOS  --   < > 3.1 3.1 2.3*  --   < > = values in this interval not  displayed.  CBC  Recent Labs Lab 11/01/15 0205 11/03/15 0355 11/04/15 0431  WBC 25.7* 18.2* 18.5*  HGB 11.1* 9.8* 9.2*  HCT 34.0* 29.1* 28.0*  PLT 340 318 258    Coag's  Recent Labs Lab 10/31/15 2234 11/02/15 0447  APTT 32 41*  INR 1.17 1.58    Sepsis Markers  Recent Labs Lab 10/31/15 2234 11/01/15 0205 11/02/15 0031 11/02/15 0447  LATICACIDVEN 3.2* 1.2 1.2 1.8  PROCALCITON 0.24  --   --   --     ABG  Recent Labs Lab 11/01/15 2325 11/02/15 0035 11/03/15 0500  PHART 7.088* 7.265* 7.323*  PCO2ART 69.5* 38.8 31.2*  PO2ART 67.9* 293* 96.2    Liver Enzymes  Recent Labs Lab 10/31/15 1544 11/01/15 0205  AST 13* 14*  ALT 8* 7*  ALKPHOS 178* 147*  BILITOT 0.6 0.3  ALBUMIN 1.9* 1.6*    Cardiac Enzymes No results for input(s): TROPONINI, PROBNP in the last 168 hours.  Glucose  Recent Labs Lab 11/03/15 1235 11/03/15 1614 11/03/15 2014 11/04/15 0059 11/04/15 0418 11/04/15 0726  GLUCAP 150* 141* 151* 176* 174* 164*    Imaging Dg Chest Port 1 View  Result Date: 11/04/2015 CLINICAL DATA:  Acute respiratory failure. EXAM: PORTABLE CHEST 1 VIEW COMPARISON:  November 03, 2015 FINDINGS: Stable support apparatus. Pacemaker. No pneumothorax. Stable right pleural effusion. Bilateral pulmonary opacities have mildly worsened in the interval. No  other interval changes or acute abnormalities. Mildly distended stomach the despite the NG tube. IMPRESSION: 1. Mildly worsened bilateral pulmonary opacities, possibly edema. Stable right effusion. 2. Stable support apparatus. 3. Mildly distended stomach despite the NG tube. Electronically Signed   By: Dorise Bullion III M.D   On: 11/04/2015 07:10     STUDIES:  CT chest 10/12 >> occlusion of Rt bronchus intermedius, basilar consolidation, small Rt effusion Bronchoscopy 10/13 >> significant purulent secretions airways  CULTURES: Blood 10/11 >> Urine 10/11 >> Acinetobacter, \yeast Sputum 10/13  >>  ANTIBIOTICS: Vancomycin 10/11 >> Levaquin 10/11 >> 10/12 Aztreonam 10/11 >> 10/12 Diflucan 10/12 >> 10/14 Primaxin 10/12 >>   SIGNIFICANT EVENTS: 10/11 Admit 10/13 VDRF, pressors, bronch  LINES/TUBES: Lt femoral aline 10/13 >> 11/03/2015 ETT 10/13 >> Lt IJ CVL 10/13 >>  DISCUSSION: 45 yo female from home with lethargy and respiratory distress.  Found to have infected sacral wounds (present prior to this admission).  She has hx of C2-C7 posterior fusion in September 2015 after traumatic spinal cord injury with paraplegia.  She developed progressive hypotension and respiratory failure from HCAP and a UTI leading to septic shock.  ASSESSMENT / PLAN:  PULMONARY A: Acute hypoxic/hypercapnic respiratory failure 2nd to sepsis, HCAP Hx of asthma Tobacco abuse P:   Continue to attempt weaning> transition to SIMV this morning, then PSV later today D/C mucomyst 10/14 Scheduled BDs  CARDIOVASCULAR A:  Septic shock resolved Hx of SSS s/p PM P:  Monitor blood pressure Continue telemetry  RENAL A:   Lactic acidosis resolved Neurogenic bladder Hypokalemia P:   Monitor BMET and UOP Replace electrolytes as needed Replete K D/C D5 NS 10/15   GASTROINTESTINAL A:   Moderate protein calorie malnutrition P:   Tube feeds Protonix for SUP  HEMATOLOGIC A:   Leukocytosis P:  F/u CBC Lovenox for DVT prophylaxis  INFECTIOUS A:   HCAP Decubitus ulcers UTI's > acinetibacter P:   Continue imipenem F/u cultures Wound care  ENDOCRINE A:   Hyperglycemia > iatrogenic P:   Last dose of solu-cortef today Stop D5 infusion  NEUROLOGIC A:   C2-C7 traumatic spinal cord injury Acute metabolic encephalopathy P:   RASS goal: 0 to -1 Continue cymbalta, Neurontin Stop fentanyl gtt Prn fentanyl precedex if needed  Goals of care >> Palliative medicine met with family on 10/15, patient desires full code, tracheostomy if necessary. They plan further conversation  considering her multiple comorbid illnesses.  CC time 35 minutes.  Roselie Awkward, MD [facility] [facility] Pager: [phone] Cell: [phone] After 3pm or if no response, call [phone]

## 2015-11-04 NOTE — Progress Notes (Signed)
Pharmacy Antibiotic Note  Angela Page is a 45 y.o. female admitted on 10/31/2015 with UTI/HCAP. Also decubitus ulcers. Pharmacy was consulted for Fluconazole/primaxin dosing.  - AF, WBCs remain elev, SCr low, CrCl 100 (rounding SCr to 0.8). - Acinetobacter in urine is sensitive to Primaxin, Bactrim, Gent, and Unasyn.  Plan: D5 antibiotics D4 Primaxin, cont 500mg  IV q6h Follow up renal fxn, culture results, and clinical course.   Height: 5\' 2"  (157.5 cm) Weight: 130 lb 15.3 oz (59.4 kg) IBW/kg (Calculated) : 50.1  Temp (24hrs), Avg:99.2 F (37.3 C), Min:97.3 F (36.3 C), Max:100 F (37.8 C)   Recent Labs Lab 10/31/15 1544 10/31/15 1807 10/31/15 2234 11/01/15 0205 11/01/15 1550 11/02/15 0031 11/02/15 0447 11/03/15 0355 11/04/15 0431  WBC 30.2*  --   --  25.7*  --   --   --  18.2* 18.5*  CREATININE 0.71  --   --  0.42* 0.38* 0.33* 0.44 0.38* 0.34*  LATICACIDVEN  --  2.77* 3.2* 1.2  --  1.2 1.8  --   --     Estimated Creatinine Clearance: 70.2 mL/min (by C-G formula based on SCr of 0.34 mg/dL (L)).    Allergies  Allergen Reactions  . Erythromycin Nausea And Vomiting  . Nitrofurantoin Monohyd Macro Nausea And Vomiting  . Penicillins Hives    Has patient had a PCN reaction causing immediate rash, facial/tongue/throat swelling, SOB or lightheadedness with hypotension: Yes Has patient had a PCN reaction causing severe rash involving mucus membranes or skin necrosis: Yes Has patient had a PCN reaction that required hospitalization Yes- went to the DR Has patient had a PCN reaction occurring within the last 10 years: No-childhood allergy If all of the above answers are "NO", then may proceed with Cephalosporin use.    Antimicrobials this admission:  10/11 >> Vanc >> 10/15 10/11 >> Aztreonam >> 10/12 10/11 >> LVQ >> 10/12 10/12 >> Fluconazole >> 10/15 10/12 >> Primaxin >>  Dose adjustments this admission:  --  Microbiology results:  10/11 BCx: NGTD 10/11 UCx:  => 100,000 colonies/mL CoNS, => 100,000 colonies/mL Acinetobacter, 50,000 colonies yeast 10/11 MRSA PCR: negative 10/13 BAL: no organisms seen  Thank you for allowing pharmacy to be a part of this patient's care.  Romeo Rabon, PharmD, pager [phone]. 11/04/2015,10:46 AM.

## 2015-11-05 ENCOUNTER — Inpatient Hospital Stay (HOSPITAL_COMMUNITY): Payer: Medicaid Other

## 2015-11-05 LAB — BLOOD GAS, ARTERIAL
ACID-BASE DEFICIT: 2.7 mmol/L — AB (ref 0.0–2.0)
Bicarbonate: 22.9 mmol/L (ref 20.0–28.0)
Drawn by: 422461
FIO2: 30
O2 SAT: 71.2 %
PEEP: 5 cmH2O
PH ART: 7.312 — AB (ref 7.350–7.450)
Patient temperature: 98.6
RATE: 24 resp/min
VT: 400 mL
pCO2 arterial: 46.7 mmHg (ref 32.0–48.0)
pO2, Arterial: 34.7 mmHg — CL (ref 83.0–108.0)

## 2015-11-05 LAB — CBC WITH DIFFERENTIAL/PLATELET
BASOS ABS: 0 10*3/uL (ref 0.0–0.1)
BASOS PCT: 0 %
EOS ABS: 0 10*3/uL (ref 0.0–0.7)
EOS PCT: 0 %
HCT: 26.9 % — ABNORMAL LOW (ref 36.0–46.0)
Hemoglobin: 8.7 g/dL — ABNORMAL LOW (ref 12.0–15.0)
Lymphocytes Relative: 6 %
Lymphs Abs: 1 10*3/uL (ref 0.7–4.0)
MCH: 28.8 pg (ref 26.0–34.0)
MCHC: 32.3 g/dL (ref 30.0–36.0)
MCV: 89.1 fL (ref 78.0–100.0)
Monocytes Absolute: 0.6 10*3/uL (ref 0.1–1.0)
Monocytes Relative: 4 %
Neutro Abs: 15.5 10*3/uL — ABNORMAL HIGH (ref 1.7–7.7)
Neutrophils Relative %: 90 %
PLATELETS: 231 10*3/uL (ref 150–400)
RBC: 3.02 MIL/uL — AB (ref 3.87–5.11)
RDW: 17.9 % — ABNORMAL HIGH (ref 11.5–15.5)
WBC: 17.1 10*3/uL — AB (ref 4.0–10.5)

## 2015-11-05 LAB — CULTURE, BLOOD (ROUTINE X 2)
Culture: NO GROWTH
Culture: NO GROWTH

## 2015-11-05 LAB — GLUCOSE, CAPILLARY
GLUCOSE-CAPILLARY: 151 mg/dL — AB (ref 65–99)
GLUCOSE-CAPILLARY: 145 mg/dL — AB (ref 65–99)
GLUCOSE-CAPILLARY: 170 mg/dL — AB (ref 65–99)
GLUCOSE-CAPILLARY: 118 mg/dL — AB (ref 65–99)
GLUCOSE-CAPILLARY: 141 mg/dL — AB (ref 65–99)
Glucose-Capillary: 127 mg/dL — ABNORMAL HIGH (ref 65–99)

## 2015-11-05 LAB — BASIC METABOLIC PANEL
Anion gap: 3 — ABNORMAL LOW (ref 5–15)
BUN: 26 mg/dL — AB (ref 6–20)
CALCIUM: 8.4 mg/dL — AB (ref 8.9–10.3)
CO2: 23 mmol/L (ref 22–32)
CREATININE: 0.34 mg/dL — AB (ref 0.44–1.00)
Chloride: 119 mmol/L — ABNORMAL HIGH (ref 101–111)
GFR calc Af Amer: >60 mL/min (ref >60)
Glucose, Bld: 144 mg/dL — ABNORMAL HIGH (ref 65–99)
Potassium: 4.1 mmol/L (ref 3.5–5.1)
SODIUM: 145 mmol/L (ref 135–145)

## 2015-11-05 LAB — PREGNANCY, URINE: Preg Test, Ur: NEGATIVE

## 2015-11-05 MED ORDER — SENNOSIDES-DOCUSATE SODIUM 8.6-50 MG PO TABS
1.0000 | ORAL_TABLET | Freq: Two times a day (BID) | ORAL | Status: DC
  Administered 2015-11-05: 1 via ORAL
  Filled 2015-11-05: qty 1

## 2015-11-05 MED ORDER — PRO-STAT SUGAR FREE PO LIQD
30.0000 mL | Freq: Every day | ORAL | Status: DC
  Administered 2015-11-05: 30 mL via ORAL
  Filled 2015-11-05: qty 30

## 2015-11-05 MED ORDER — BISACODYL 10 MG RE SUPP
10.0000 mg | Freq: Once | RECTAL | Status: AC
  Administered 2015-11-05: 10 mg via RECTAL
  Filled 2015-11-05: qty 1

## 2015-11-05 MED ORDER — DOCUSATE SODIUM 50 MG/5ML PO LIQD
100.0000 mg | Freq: Two times a day (BID) | ORAL | Status: DC
  Administered 2015-11-05: 100 mg
  Filled 2015-11-05: qty 10

## 2015-11-05 MED ORDER — FREE WATER
200.0000 mL | Freq: Four times a day (QID) | Status: DC
  Administered 2015-11-05 – 2015-11-06 (×4): 200 mL

## 2015-11-05 MED ORDER — VITAL AF 1.2 CAL PO LIQD
1000.0000 mL | ORAL | Status: DC
  Administered 2015-11-05 (×2): 1000 mL
  Filled 2015-11-05 (×2): qty 1000

## 2015-11-05 MED ORDER — SENNOSIDES 8.8 MG/5ML PO SYRP
5.0000 mL | ORAL_SOLUTION | Freq: Two times a day (BID) | ORAL | Status: DC
  Administered 2015-11-05 – 2015-11-14 (×14): 5 mL
  Filled 2015-11-05 (×23): qty 5

## 2015-11-05 NOTE — Progress Notes (Signed)
Nutrition Follow-up  DOCUMENTATION CODES:   Non-severe (moderate) malnutrition in context of acute illness/injury, Underweight  INTERVENTION:  - Will change to Vital 1.2 @ 50 mL/hr with 30 mL Prostat once/day to provide 1540 kcal, 105 grams of protein, and 973 mL free water.  - Continue PEPuP protocol. - Will d/c free water flush at this time. Free water, if desired, per MD/NP. - RD will follow-up 10/18.  NUTRITION DIAGNOSIS:   Inadequate oral intake related to inability to eat as evidenced by NPO status. -ongoing  GOAL:   Patient will meet greater than or equal to 90% of their needs -unmet for kcal with current TF rate based on updated estimated kcal need.   MONITOR:   Vent status, TF tolerance, Weight trends, Labs, Skin, I & O's  ASSESSMENT:   45 y.o. 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 Presented with worsening confusion status decreased by mouth intake foul-smelling decubitus ulcer. Apparently has been more sedated than usual at home; family did endorse that patient may have possibly received more narcotics than usual. Family also states that she has had decreased by mouth intake. Mother was very concerned that patient developed lung cancer based on the fact that her father died of it and want patient to be tested extensively. In ED patient required Narcan 2 and responded: suggestive of opioid overdose contributing to oversedation  10/16 Pt continues with OGT and is currently receiving Vital High Protein @ goal rate of 55 mL/hr which is providing 1320 kcal, 116 grams of protein, and 1103 mL free water. Estimated nutrition needs updated this AM and based on weight from 10/12 (50.4 kg) with weight +10.5 kg since that time  and currently not receiving IVF. Order in place for 100 mL free water QID but not currently programmed into pump. Will continue to monitor weight trends and adjust as needed.   Palliative Care note reviewed. Per CCM notes, vent weaning.   Patient is currently intubated on ventilator support MV: 6.2 L/min Temp (24hrs), Avg:100.2 F (37.9 C), Min:98.8 F (37.1 C), Max:102.2 F (39 C) Propofol: none  Medications reviewed; 50 mg IV Solu-cortef BID, PRN IV Zofran, 40 mEq KCl via OGT BID, 1 tablet Senokot BID. Labs reviewed; CBGs: 127 and 151 mg/dL this AM, Cl: 119 mmol/L, BUN: 26 mg/dL, creatinine: 0.34 mg/dL, Ca: 8.4 mg/dL.    10/13 - Pt intubated at ~0020 today and OGT currently in place.  - Estimated protein needs increased based on wounds.   Patient is currently intubated on ventilator support MV: 11.9 L/min Temp (24hrs), Avg:97.4 F (36.3 C), Max:98.6 F (37 C) Propofol: none BP: 88/50 and MAP: 62  IVF: D5-NS @ 50 mL/hr (204 kcal).  Drips: Fentanyl @ 250 mcg/hr, Neo @ 30 mcg/min.    10/12 - IBW is based on paraplegia.  - Pt has been NPO since admission; Heart Healthy diet was ordered and then canceled d/t pt's current state of oversedation.  - Pt very drowsy during RD visit and was able to slightly nod head to simple questions.  - Pt indicates that she is having abdominal pain but no nausea at time of visit.  - No other information about to be obtained at this time. - Physical assessment shows mild fat and mild to moderate muscle wasting to upper body; no wasting to  lower body at this time and no noted edema.  - Per chart review, CBW consistent with weight from the beginning of September and is up (18 lbs) since February.   IVF:NS @ 125 mL/hr.    Diet Order:  Diet NPO time specified  Skin:    Stage 4 pressure injuries to coccyx and bilateral buttocks, Stage 1 pressure injuries to mid, upper, bilateral back and L shoulder, Unstageable wound to cervical  area.  Last BM:  10/8  Height:   Ht Readings from Last 1 Encounters:  10/31/15 5\' 2"  (1.575 m)    Weight:   Wt Readings from Last 1 Encounters:  11/05/15 134 lb 4.2 oz (60.9 kg)    Ideal Body Weight:  47.5 kg  BMI:  Body mass index is 24.56 kg/m.  Estimated Nutritional Needs:   Kcal:  [id number]  Protein:  100-111 (2-2.2 grams/kg)  Fluid:  >/= 1.6 L/day  EDUCATION NEEDS:   No education needs identified at this time    Jarome Matin, MS, RD, LDN Inpatient Clinical Dietitian Pager # [phone] After hours/weekend pager # [phone]

## 2015-11-05 NOTE — Progress Notes (Signed)
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 Abnormal edema tracking into the mons pubis, labia, & upper thigh regions and an asymmetric fashion without gas infiltration. No evidence of emphysematous fasciitis along perineum previous report likely due to considerable subcutaneous stranding outlining lobules of adipose tissue and simulating gas.   eICU Interventions  Restarting tube feeds at previous rate.      Intervention Category Intermediate Interventions: Diagnostic test evaluation  Tera Partridge 11/05/2015, 4:02 PM

## 2015-11-05 NOTE — Progress Notes (Signed)
PULMONARY / CRITICAL CARE MEDICINE   Name: Brailyn Killion MRN: [id number] DOB: [birth year]    ADMISSION DATE:  10/31/2015 CONSULTATION DATE:  11/01/2015  REFERRING MD:  Dr. Roel Cluck, [facility]  CHIEF COMPLAINT:  Short of breath  SUBJECTIVE:  RN reports pt VSS stable, fever to 101.5 this am, no BM in several days.  PS 10 wean.  VITAL SIGNS: BP (!) 96/59   Pulse (!) 109   Temp (!) 101.5 F (38.6 C) (Core (Comment))   Resp 20   Ht _0  (1.575 m)   Wt 134 lb 4.2 oz (60.9 kg)   SpO2 96%   BMI 24.56 kg/m   HEMODYNAMICS:    VENTILATOR SETTINGS: Vent Mode: SIMV/PC/PS FiO2 (%):  [30 %-40 %] 30 % Set Rate:  [10 bmp-24 bmp] 20 bmp Vt Set:  [400 mL] 400 mL PEEP:  [5 cmH20] 5 cmH20 Pressure Support:  [8 cmH20] 8 cmH20 Plateau Pressure:  [19 cmH20-24 cmH20] 21 cmH20  INTAKE / OUTPUT: I/O last 3 completed shifts: In: 4333.7 [I.V.:1263.7; NG/GT:2470; IV [id number]:782] Out: 1700 [Urine:1700]  PHYSICAL EXAMINATION: General:  Chronically ill appearing female, alert on vent HEENT:  ETT in place, no appreciable JVD, L IJ TLC c/d/i Pulmonary:   Vent supported breaths, clear anterior, diminished lower lateral  CV: s1s2 rrr, mild tachy, no murmurs gallops or rubs Abdomen:  BSx4 active, mild distention MSK:  contractures bilaterally, no acute deformities Skin:  BLE edema 2+ bilaterally, multiple skin wounds over heels, sacral area Neurologic: Awake, alert, follows commands appropriately, no movement lower extremities, contractures right upper extremity  LABS:  BMET  Recent Labs Lab 11/03/15 0355 11/04/15 0431 11/05/15 0415  NA 139 142 145  K 3.7 3.0* 4.1  CL 118* 119* 119*  CO2 16* 21* 23  BUN 17 20 26*  CREATININE 0.38* 0.34* 0.34*  GLUCOSE 130* 198* 144*    Electrolytes  Recent Labs Lab 11/02/15 1700 11/03/15 0355 11/03/15 1720 11/04/15 0431 11/05/15 0415  CALCIUM  --  7.4*  --  7.8* 8.4*  MG 1.8 1.9 1.7  --   --   PHOS 3.1 3.1 2.3*  --   --     CBC  Recent  Labs Lab 11/03/15 0355 11/04/15 0431 11/05/15 0415  WBC 18.2* 18.5* 17.1*  HGB 9.8* 9.2* 8.7*  HCT 29.1* 28.0* 26.9*  PLT 318 258 231    Coag's  Recent Labs Lab 10/31/15 2234 11/02/15 0447  APTT 32 41*  INR 1.17 1.58    Sepsis Markers  Recent Labs Lab 10/31/15 2234 11/01/15 0205 11/02/15 0031 11/02/15 0447  LATICACIDVEN 3.2* 1.2 1.2 1.8  PROCALCITON 0.24  --   --   --     ABG  Recent Labs Lab 11/02/15 0035 11/03/15 0500 11/05/15 0414  PHART 7.265* 7.323* 7.312*  PCO2ART 38.8 31.2* 46.7  PO2ART 293* 96.2 34.7*    Liver Enzymes  Recent Labs Lab 10/31/15 1544 11/01/15 0205  AST 13* 14*  ALT 8* 7*  ALKPHOS 178* 147*  BILITOT 0.6 0.3  ALBUMIN 1.9* 1.6*    Cardiac Enzymes No results for input(s): TROPONINI, PROBNP in the last 168 hours.  Glucose  Recent Labs Lab 11/04/15 1203 11/04/15 1551 11/04/15 1935 11/04/15 2325 11/05/15 0349 11/05/15 0810  GLUCAP 156* 154* 133* 162* 127* 151*    Imaging Dg Chest Port 1 View  Result Date: 11/05/2015 CLINICAL DATA:  Hypoxia EXAM: PORTABLE CHEST 1 VIEW COMPARISON:  November 04, 2015. FINDINGS: Endotracheal tube tip is 3.5 cm above the  carina. Nasogastric tube tip and side port are below the diaphragm. Pacemaker leads are attached to the right atrium and right ventricle. No evident pneumothorax. There is a right pleural effusion which appears partially loculated, stable. There is interstitial and patchy alveolar edema in the mid and lower lung zones bilaterally. Heart is borderline prominent with pulmonary vascularity within normal limits. There is postoperative change in the cervical spine. IMPRESSION: Tube and pacemaker lead positions as described without pneumothorax. Right pleural effusion with areas of interstitial and patchy alveolar edema. Suspect a degree of congestive heart failure. A degree of superimposed pneumonia cannot be excluded radiographically. The appearance of the lungs and cardiac  silhouette is stable compared to 1 day prior. Electronically Signed   By: Lowella Grip III M.D.   On: 11/05/2015 07:18     STUDIES:  CT chest 10/12 >> occlusion of Rt bronchus intermedius, basilar consolidation, small Rt effusion Bronchoscopy 10/13 >> significant purulent secretions airways  CULTURES: Blood 10/11 >> Urine 10/11 >> Acinetobacter, yeast Sputum 10/13 >> neg  ANTIBIOTICS: Vancomycin 10/11 >> 10/15 Levaquin 10/11 >> 10/12 Aztreonam 10/11 >> 10/12 Diflucan 10/12 >> 10/14 Primaxin 10/12 >>   SIGNIFICANT EVENTS: 10/11  Admit 10/13  VDRF, pressors, bronch 10/15  Vasopressors off  10/16  Fever, weaning on PSV  LINES/TUBES: Lt femoral aline 10/13 >> 10/14 ETT 10/13 >> Lt IJ CVL 10/13 >>  DISCUSSION: 45 yo female from home with lethargy and respiratory distress.  Found to have infected sacral wounds (present prior to this admission).  She has hx of C2-C7 posterior fusion in September 2015 after traumatic spinal cord injury with paraplegia (fell down stairs while intoxicated).  She developed progressive hypotension and respiratory failure from HCAP and a UTI leading to septic shock.  ASSESSMENT / PLAN:  PULMONARY A: Acute hypoxic/hypercapnic respiratory failure 2nd to sepsis, HCAP Hx of asthma Tobacco abuse P:   PRVC 8 cc/kg  Wean PEEP / FiO2 for sats > 92% Pulmonary hygiene - percussion via bed, mobilize as able  Scheduled BDs Completed mucomyst therapy  Intermittent CXR   CARDIOVASCULAR A:  Septic shock - resolved Hx of SSS s/p PM P:  Monitor blood pressure Continue telemetry monitoring  RENAL A:   Lactic acidosis resolved Neurogenic bladder Hypokalemia P:   Monitor BMET and UOP Replace electrolytes as needed KVO IVF   GASTROINTESTINAL A:   Moderate protein calorie malnutrition Constipation P:   Tube feeds Protonix for SUP Assess KUB, r/o ileus  HEMATOLOGIC A:   Leukocytosis P:  F/u CBC Lovenox for DVT  prophylaxis  INFECTIOUS A:   Fever - 10/16, potential sources include wounds, foley, PNA, central line HCAP Decubitus ulcers - extensive wounds on sacrum, legs, back of head UTI's > acinetibacter P:   Continue abx as above F/u cultures Wound care  ENDOCRINE A:   Hyperglycemia > iatrogenic P:   Completed stress steroids   NEUROLOGIC A:   C2-C7 traumatic spinal cord injury Acute metabolic encephalopathy P:   RASS goal: 0 to -1 Continue cymbalta, Neurontin PRN fentanyl   Goals of care >> Palliative medicine met with family on 10/14, patient desires full code, tracheostomy if necessary. They plan further conversation considering her multiple comorbid illnesses.    Noe Gens, NP-C [facility] Pgr: [phone] or if no answer [phone] 11/05/2015, 10:00 AM   Attending Note:  I have examined patient, reviewed labs, studies and notes. I have discussed the case with B Ollis, and I agree with the data  and plans as amended above. Ms Kannan is an unfortunate 45 yo woman, paraplegic s/p fall and C spine injury. She was admitted with UTI, probable HCAP and septic shock, acute resp failure. On my eval her hypotension is improved, off pressors. She is awake and attempts to interact, mouth words. Decreased BS at bases with some insp crackles. Tolerating PSV. attempts to cough but weak. Will continue her abx, continue our efforts at vent weaning. She may need tracheostomy if improvement stalls. Independent critical care time is 35 minutes.   Baltazar Apo, MD, PhD 11/06/2015, 10:04 AM [facility] [phone] or if no answer [phone]

## 2015-11-05 NOTE — Progress Notes (Signed)
Called by Radiologist regarding concern for possible gas density along the pelvic soft tissues and perineum.  Difficult to exclude fasciitis vs decubitus ulcers involving the perineum.    Plan: STAT non-contrast CT of pelvis without oral contrast to rule out fasciitis  Noe Gens, NP-C [facility] Pgr: [phone] or if no answer [phone] 11/05/2015, 1:03 PM

## 2015-11-05 NOTE — Progress Notes (Signed)
[facility] Progress Note Patient Name: Angela Page DOB: 28-Jun-1970 MRN: [id number]   Date of Service  11/05/2015  HPI/Events of Note  Nurse reports no bowel movements since tube feeds started on Friday. Camera check shows patient on ventilator. Nurse reports patient does have bowel sounds present. Currently on Senokot S via tube twice a day.   eICU Interventions  1. Switched to liquid Colace and senna via tube twice daily 2. Dulcolax suppository 1      Intervention Category Minor Interventions: Other:  Tera Partridge 11/05/2015, 7:00 PM

## 2015-11-05 NOTE — Progress Notes (Signed)
Date:  November 05, 2015 Chart reviewed for concurrent status and case management needs. Will continue to follow the patient for status change: remains on full vent. support Discharge Planning: following for needs Expected discharge date: [id number] Rhonda Davis, BSN, [place], [place]

## 2015-11-05 NOTE — Progress Notes (Signed)
[facility] Progress Note Patient Name: Angela Page DOB: [birth year] MRN: [id number]   Date of Service  11/05/2015  HPI/Events of Note  On SIMV, ph last 7.32 and NOW MV low on set rate 10  eICU Interventions  Back to [facility] Rate 24 abg in 5 am      Intervention Category Major Interventions: Acid-Base disturbance - evaluation and management  Vyolet Sakuma J. 11/05/2015, 2:01 AM

## 2015-11-06 LAB — GLUCOSE, CAPILLARY
GLUCOSE-CAPILLARY: 101 mg/dL — AB (ref 65–99)
GLUCOSE-CAPILLARY: 89 mg/dL (ref 65–99)
Glucose-Capillary: 132 mg/dL — ABNORMAL HIGH (ref 65–99)
Glucose-Capillary: 122 mg/dL — ABNORMAL HIGH (ref 65–99)
Glucose-Capillary: 82 mg/dL (ref 65–99)

## 2015-11-06 MED ORDER — BACLOFEN 10 MG PO TABS
10.0000 mg | ORAL_TABLET | Freq: Three times a day (TID) | ORAL | Status: DC
Start: 2015-11-06 — End: 2015-11-07
  Administered 2015-11-06 – 2015-11-07 (×3): 10 mg via ORAL
  Filled 2015-11-06 (×3): qty 1

## 2015-11-06 MED ORDER — GABAPENTIN 250 MG/5ML PO SOLN
400.0000 mg | Freq: Three times a day (TID) | ORAL | Status: DC
  Administered 2015-11-06 – 2015-11-07 (×3): 400 mg
  Filled 2015-11-06 (×4): qty 8

## 2015-11-06 MED ORDER — BACLOFEN 10 MG PO TABS: 10.0000 mg | ORAL_TABLET | Freq: Three times a day (TID) | ORAL | Status: DC

## 2015-11-06 MED ORDER — BACLOFEN 1 MG/ML ORAL SUSPENSION
10.0000 mg | Freq: Three times a day (TID) | ORAL | Status: DC
  Filled 2015-11-06 (×2): qty 1

## 2015-11-06 MED ORDER — LORAZEPAM 2 MG/ML IJ SOLN
0.5000 mg | Freq: Once | INTRAMUSCULAR | Status: AC
  Administered 2015-11-07: 0.5 mg via INTRAVENOUS
  Filled 2015-11-06: qty 1

## 2015-11-06 MED ORDER — DOCUSATE SODIUM 50 MG/5ML PO LIQD
100.0000 mg | Freq: Two times a day (BID) | ORAL | Status: DC
  Administered 2015-11-06 – 2015-11-27 (×34): 100 mg via ORAL
  Filled 2015-11-06 (×35): qty 10

## 2015-11-06 MED ORDER — FENTANYL CITRATE (PF) 100 MCG/2ML IJ SOLN: 25.0000 ug | INTRAMUSCULAR | Status: DC | PRN

## 2015-11-06 MED ORDER — FENTANYL CITRATE (PF) 100 MCG/2ML IJ SOLN
25.0000 ug | INTRAMUSCULAR | Status: DC | PRN
  Administered 2015-11-06 – 2015-11-07 (×6): 50 ug via INTRAVENOUS
  Administered 2015-11-07: 25 ug via INTRAVENOUS
  Administered 2015-11-08 (×2): 50 ug via INTRAVENOUS
  Administered 2015-11-08 (×2): 25 ug via INTRAVENOUS
  Administered 2015-11-09 – 2015-11-12 (×30): 50 ug via INTRAVENOUS
  Filled 2015-11-06 (×46): qty 2

## 2015-11-06 NOTE — Progress Notes (Signed)
Patient complaining of gasping for air, patient not breathing through her nasal cannula and very anxious. RT called for breathing treatment. Chest sounds sound as if she needs to cough and clear her throat but patient stating she doesn't want to. Patient also requested to call mother, while on the phone patient stated "Reyes Ivan make it to Monday Im not healthy im dying and need to go to the hospital." Patient reassured and reoriented. RT arrived with Albuterol and patient became more calm. E-Link called to update. Fan applied and RN and RT remained with patient until patient became calm. RN to continue to monitor.

## 2015-11-06 NOTE — Procedures (Signed)
Extubation Procedure Note  Patient Details:   Name: Angela Page DOB: 01-14-1971 MRN: [id number]   Airway Documentation:     Evaluation  O2 sats: stable throughout Complications: No apparent complications Patient did tolerate procedure well. Bilateral Breath Sounds: Clear   Yes   Patient tolerated extubation without any complications. Placed on 2 L [place] with O2 sats of 95% at this time. RT will continue to monitor patient.   Lamonte Sakai 11/06/2015, 10:22 AM

## 2015-11-06 NOTE — Progress Notes (Signed)
PULMONARY / CRITICAL CARE MEDICINE   Name: Angela Page MRN: [id number] DOB: November 13, 1970    ADMISSION DATE:  10/31/2015 CONSULTATION DATE:  11/01/2015  REFERRING MD:  Dr. Adela Glimpse, [facility]  CHIEF COMPLAINT:  Short of breath  SUBJECTIVE:  RN reports pt more agitated this am, chewing on ETT, "appears uncomfortable".  Tmax 102.6 in last 24 hours  VITAL SIGNS: BP 112/72 (BP Location: Left Arm)   Pulse 92   Temp 99 F (37.2 C) (Core (Comment))   Resp (!) 21   Ht 5\' 2"  (1.575 m)   Wt 135 lb 2.3 oz (61.3 kg)   SpO2 97%   BMI 24.72 kg/m   HEMODYNAMICS:    VENTILATOR SETTINGS: Vent Mode: PRVC FiO2 (%):  [30 %] 30 % Set Rate:  [20 bmp] 20 bmp Vt Set:  [400 mL] 400 mL PEEP:  [5 cmH20] 5 cmH20 Pressure Support:  [10 cmH20] 10 cmH20 Plateau Pressure:  [20 cmH20-21 cmH20] 20 cmH20  INTAKE / OUTPUT: I/O last 3 completed shifts: In: 3356.4 [I.V.:585; NG/GT:2171.4; IV Piggyback:600] Out: 2700 [Urine:2700]  PHYSICAL EXAMINATION: General:  Chronically ill appearing female, alert on vent HEENT:  ETT in place, no appreciable JVD, L IJ TLC c/d/i Pulmonary:   Vent supported breaths, clear anterior, diminished lower lateral  CV: s1s2 rrr, mild tachy, no murmurs gallops or rubs Abdomen:  BSx4 active, mild distention MSK:  contractures bilaterally, no acute deformities Skin:  BLE edema 2+ bilaterally, multiple skin wounds over heels, sacral area Neurologic: Awake, alert, follows commands appropriately, no movement lower extremities, contractures right upper extremity  LABS:  BMET  Recent Labs Lab 11/03/15 0355 11/04/15 0431 11/05/15 0415  NA 139 142 145  K 3.7 3.0* 4.1  CL 118* 119* 119*  CO2 16* 21* 23  BUN 17 20 26*  CREATININE 0.38* 0.34* 0.34*  GLUCOSE 130* 198* 144*    Electrolytes  Recent Labs Lab 11/02/15 1700 11/03/15 0355 11/03/15 1720 11/04/15 0431 11/05/15 0415  CALCIUM  --  7.4*  --  7.8* 8.4*  MG 1.8 1.9 1.7  --   --   PHOS 3.1 3.1 2.3*  --   --      CBC  Recent Labs Lab 11/03/15 0355 11/04/15 0431 11/05/15 0415  WBC 18.2* 18.5* 17.1*  HGB 9.8* 9.2* 8.7*  HCT 29.1* 28.0* 26.9*  PLT 318 258 231    Coag's  Recent Labs Lab 10/31/15 2234 11/02/15 0447  APTT 32 41*  INR 1.17 1.58    Sepsis Markers  Recent Labs Lab 10/31/15 2234 11/01/15 0205 11/02/15 0031 11/02/15 0447  LATICACIDVEN 3.2* 1.2 1.2 1.8  PROCALCITON 0.24  --   --   --     ABG  Recent Labs Lab 11/02/15 0035 11/03/15 0500 11/05/15 0414  PHART 7.265* 7.323* 7.312*  PCO2ART 38.8 31.2* 46.7  PO2ART 293* 96.2 34.7*    Liver Enzymes  Recent Labs Lab 10/31/15 1544 11/01/15 0205  AST 13* 14*  ALT 8* 7*  ALKPHOS 178* 147*  BILITOT 0.6 0.3  ALBUMIN 1.9* 1.6*    Cardiac Enzymes No results for input(s): TROPONINI, PROBNP in the last 168 hours.  Glucose  Recent Labs Lab 11/05/15 0810 11/05/15 1131 11/05/15 1548 11/05/15 1916 11/05/15 2312 11/06/15 0751  GLUCAP 151* 145* 170* 118* 141* 132*    Imaging Ct Pelvis Wo Contrast  Result Date: 11/05/2015 CLINICAL DATA:  Quadriplegia, decubitus ulcers, assessment for possible perineal fasciitis. EXAM: CT PELVIS WITHOUT CONTRAST TECHNIQUE: Multidetector CT imaging of the pelvis was  performed following the standard protocol without intravenous contrast. COMPARISON:  Radiographs from 11/05/2015; pelvic CT from 12/31/2011 FINDINGS: Urinary Tract: Foley catheter in the urinary bladder. Tiny calcifications in the right-sided the urinary bladder compatible with small bladder calculi. Bowel: Mild prominence of stool in the colon. No dilated small bowel in the pelvis. Vascular/Lymphatic: Dense atherosclerotic calcification. Reproductive: Calcification along the anterior uterine margin probably from a fibroid. Other: Extensive subcutaneous edema along the lower abdomen and pelvis. Moderate pelvic ascites. Musculoskeletal: Stage IV decubitus ulcer along the posterior sacrum extending to the bony margin.  Stage IV decubitus ulcers overlying both ischial tuberosities, extending to the bony margin. Sclerosis of the left ischial tuberosity extending into the posterior wall of the left acetabulum compatible with chronic osteomyelitis. I would be surprised if there is not also chronic osteomyelitis of the right ischium and of the sacrum. Bony spurring of the posterior wall of the right acetabulum. Patchy osteoporosis in the lower pelvis and hips, small left hip joint effusion. Abnormal edema tracks into the mons pubis, labia, and upper thigh regions, an asymmetric fashion, an without gas infiltration to suggest emphysematous fasciitis. IMPRESSION: 1. There is no evidence of emphysematous fasciitis along the perineum. The appearance at earlier radiography was likely either due to the considerable subcutaneous stranding outlining lobules of adipose tissue and simulating gas, or possibly fluid within a diaper or garment along the perineum. 2. There are stage IV decubitus ulcers along the sacrum and ischial tuberosities, associated with chronic osteomyelitis, most apparent in the left ischium. 3. Diffuse subcutaneous edema and third spacing of fluid including mesenteric edema and moderate pelvic ascites. 4. Small bladder calculi are present. 5.  Aortoiliac atherosclerotic vascular disease. Electronically Signed   By: Van Clines M.D.   On: 11/05/2015 14:36   Dg Abd Portable 1v  Addendum Date: 11/05/2015   ADDENDUM REPORT: 11/05/2015 12:49 ADDENDUM: The original report was by Dr. Van Clines. The following addendum is by Dr. Van Clines: These results were called by telephone at the time of interpretation on 11/05/2015 at 12:37 pm to Dr. Noe Gens , who verbally acknowledged these results. Electronically Signed   By: Van Clines M.D.   On: 11/05/2015 12:49   Result Date: 11/05/2015 CLINICAL DATA:  Constipation. Distended abdomen. Hx asthma, GERD, quadriparesis, appendectomy EXAM: PORTABLE  ABDOMEN - 1 VIEW COMPARISON:  05/07/2015. FINDINGS: Nasogastric tube noted, tip in the stomach antrum. Prominent stool throughout the colon favors constipation. No dilated small bowel identified. New blunting of the right lateral costophrenic angle. Interstitial accentuation at the lung bases the. Pacer leads project over the cardiac shadow. Bony demineralization in the lower pelvis and hips. Streaky linear lucencies -possibly gas density- along the pelvic soft tissues of the perineum. IMPRESSION: 1. Questionable gas densities along the perineum, I cannot exclude fasciitis or decubitus ulcers involving the perineum and underlying the ischial tuberosities. Conceivably similar density could be caused by partially wet diaper or clothing. Fasciitis involving the pelvic floor can be fulminant than life-threatening, CT of the pelvis should be considered for further characterization. 2. Patchy lucencies in both hips and in the lower pelvis, some of this may represent gas density projecting over the hips, and some may be reactive. 3.  Prominent stool throughout the colon favors constipation. 4. Right pleural effusion with bibasilar indistinct densities. Radiology assistant personnel have been notified to put me in telephone contact with the referring physician or the referring physician's clinical representative in order to discuss these findings. Once this communication is established  I will issue an addendum to this report for documentation purposes. Electronically Signed: By: Gaylyn Rong M.D. On: 11/05/2015 12:35     STUDIES:  CT chest 10/12 >> occlusion of Rt bronchus intermedius, basilar consolidation, small Rt effusion Bronchoscopy 10/13 >> significant purulent secretions airways CT Pelvis 10/16 >> no evidence of emphysematous fasciitis along the perineum, stage IV decubitus ulcers along the sacrum / ischial tuberosities associated with chronic osteomyelitis  CULTURES: Blood 10/11 >> neg Urine 10/11  >> Acinetobacter, yeast Sputum 10/13 >> neg  ANTIBIOTICS: Vancomycin 10/11 >> 10/15 Levaquin 10/11 >> 10/12 Aztreonam 10/11 >> 10/12 Diflucan 10/12 >> 10/14 Primaxin 10/12 >>   SIGNIFICANT EVENTS: 10/11  Admit 10/13  VDRF, pressors, bronch 10/15  Vasopressors off  10/16  Fever, weaning on PSV 10/5  LINES/TUBES: Lt femoral aline 10/13 >> 10/14 ETT 10/13 >> Lt IJ CVL 10/13 >>  DISCUSSION: 45 yo female from home with lethargy and respiratory distress.  Found to have infected sacral wounds (present prior to this admission).  She has hx of C2-C7 posterior fusion in September 2015 after traumatic spinal cord injury with paraplegia (fell down stairs while intoxicated).  She developed progressive hypotension and respiratory failure from HCAP and a UTI leading to septic shock.  ASSESSMENT / PLAN:  PULMONARY A: Acute hypoxic/hypercapnic respiratory failure 2nd to sepsis, HCAP Hx of asthma Tobacco abuse P:   PRVC 8 cc/kg  Wean PEEP / FiO2 for sats > 92% Daily SBT / WUA Pulmonary hygiene - percussion via bed, mobilize as able  Scheduled BDs Completed mucomyst therapy  Intermittent CXR   CARDIOVASCULAR A:  Septic shock - resolved Hx of SSS s/p PM P:  Monitor blood pressure Continue telemetry monitoring MAP > 60 acceptable   RENAL A:   Lactic acidosis - resolved Neurogenic bladder Hypokalemia P:   Monitor BMET and UOP Replace electrolytes as needed KVO IVF  GASTROINTESTINAL A:   Moderate protein calorie malnutrition Constipation  Ileus ruled out 10/16, emphysematous fasciitis as well by CT P:   TF per Nutrition Protonix for SUP Sennakot-S BID   HEMATOLOGIC A:   Leukocytosis P:  F/u CBC Lovenox for DVT prophylaxis  INFECTIOUS A:   Fever - 10/16, potential sources include wounds, foley, PNA, central line HCAP Decubitus ulcers - extensive wounds on sacrum, legs, back of head Acinetobacter + Yeast UTI  P:   Continue abx as above F/u cultures Wound  care  ENDOCRINE A:   Hyperglycemia > iatrogenic P:   Completed stress steroids  Monitor glucose on BMP  NEUROLOGIC A:   C2-C7 traumatic spinal cord injury Acute metabolic encephalopathy P:   RASS goal: 0 to -1 Continue cymbalta, Neurontin PRN fentanyl Resume home baclofen at reduced dose   Goals of care >> Palliative medicine met with family on 10/14, patient desires full code, tracheostomy if necessary. They plan further conversation considering her multiple comorbid illnesses.    Canary Brim, NP-C [facility] Pgr: [phone] or if no answer [phone] 11/06/2015, 9:32 AM  Attending Note:  I have examined patient, reviewed labs, studies and notes. I have discussed the case with B Ollis, and I agree with the data and plans as amended above. 45 yo woman, hx fall and cervical spine injury with resultant paraplegia. She was admitted with sepsis and acute resp failure requiring mechanical ventilation in setting UTI and HCAP. She has slowly improved, is on imipenem of acinetobacter UTI. On eval today she is more awake, appears stronger on PSV mode.  She has decreased B BS. She has a stronger cough. I would like to attempt extubation today. If she fails then she may yet require tracheostomy.  Independent critical care time is 35 minutes.   Baltazar Apo, MD, PhD 11/06/2015, 10:17 AM [facility] [phone] or if no answer [phone]

## 2015-11-06 NOTE — Evaluation (Signed)
SLP Cancellation Note  Patient Details Name: Angela Page MRN: [id number] DOB: 1970-11-23   Cancelled treatment:       Reason Eval/Treat Not Completed: Medical issues which prohibited therapy (pt extubatd today after six days intubation; has h/o of recent dysphagia 1 week prior to admission, recent pna related hospital admit with pt coding and decreased mental status prior to admit.    SLP recommends defer BSE and proceed with MBS and esophagram if indicated when pt able to participate, made RN Baker Janus aware of recommendations today at approx noon, thanks for this order )  Will plan for MBS and possible esophagram for next date if pt able and MD agrees.     [name], [facility] SLP [phone]

## 2015-11-06 NOTE — Progress Notes (Signed)
[place] Progress Note Patient Name: Angela Page DOB: July 25, 1970 MRN: [id number]   Date of Service  11/06/2015  HPI/Events of Note  Complains of dyspnea Oxygenation OK on nasal cannula Mild improvement with albuterol  RN notes anxiety  eICU Interventions  Check ABG Low dose ativan x1     Intervention Category Major Interventions: Respiratory failure - evaluation and management  Simonne Maffucci 11/06/2015, 11:37 PM

## 2015-11-07 ENCOUNTER — Inpatient Hospital Stay (HOSPITAL_COMMUNITY): Payer: Medicaid Other

## 2015-11-07 DIAGNOSIS — J9601 Acute respiratory failure with hypoxia: Secondary | ICD-10-CM

## 2015-11-07 LAB — CBC
HCT: 22.4 % — ABNORMAL LOW (ref 36.0–46.0)
HEMOGLOBIN: 7.4 g/dL — AB (ref 12.0–15.0)
MCH: 29.5 pg (ref 26.0–34.0)
MCHC: 33 g/dL (ref 30.0–36.0)
MCV: 89.2 fL (ref 78.0–100.0)
PLATELETS: 168 10*3/uL (ref 150–400)
RBC: 2.51 MIL/uL — ABNORMAL LOW (ref 3.87–5.11)
RDW: 17.5 % — ABNORMAL HIGH (ref 11.5–15.5)
WBC: 15.2 10*3/uL — ABNORMAL HIGH (ref 4.0–10.5)

## 2015-11-07 LAB — BLOOD GAS, ARTERIAL
Acid-Base Excess: 1.2 mmol/L (ref 0.0–2.0)
Acid-Base Excess: 1.1 mmol/L (ref 0.0–2.0)
Bicarbonate: 25.2 mmol/L (ref 20.0–28.0)
Bicarbonate: 26.8 mmol/L (ref 20.0–28.0)
Drawn by: 232811
Drawn by: 232811
FIO2: 0.4
FIO2: 40
MECHVT: 400 mL
O2 SAT: 90.7 %
O2 Saturation: 98.6 %
PATIENT TEMPERATURE: 35.7
PCO2 ART: 49.6 mmHg — AB (ref 32.0–48.0)
PEEP: 8 cmH2O
PH ART: 7.346 — AB (ref 7.350–7.450)
PO2 ART: 114 mmHg — AB (ref 83.0–108.0)
PO2 ART: 59.5 mmHg — AB (ref 83.0–108.0)
Patient temperature: 36
RATE: 20 resp/min
pCO2 arterial: 37.5 mmHg (ref 32.0–48.0)
pH, Arterial: 7.436 (ref 7.350–7.450)

## 2015-11-07 LAB — BASIC METABOLIC PANEL
Anion gap: 5 (ref 5–15)
BUN: 23 mg/dL — ABNORMAL HIGH (ref 6–20)
CALCIUM: 8.1 mg/dL — AB (ref 8.9–10.3)
CHLORIDE: 116 mmol/L — AB (ref 101–111)
CO2: 26 mmol/L (ref 22–32)
GLUCOSE: 75 mg/dL (ref 65–99)
Potassium: 2.8 mmol/L — ABNORMAL LOW (ref 3.5–5.1)
Sodium: 147 mmol/L — ABNORMAL HIGH (ref 135–145)

## 2015-11-07 LAB — GLUCOSE, CAPILLARY
GLUCOSE-CAPILLARY: 74 mg/dL (ref 65–99)
GLUCOSE-CAPILLARY: 101 mg/dL — AB (ref 65–99)
Glucose-Capillary: 94 mg/dL (ref 65–99)
Glucose-Capillary: 84 mg/dL (ref 65–99)
Glucose-Capillary: 114 mg/dL — ABNORMAL HIGH (ref 65–99)

## 2015-11-07 LAB — LACTIC ACID, PLASMA: LACTIC ACID, VENOUS: 0.7 mmol/L (ref 0.5–1.9)

## 2015-11-07 MED ORDER — DEXMEDETOMIDINE HCL IN NACL 400 MCG/100ML IV SOLN
0.4000 ug/kg/h | INTRAVENOUS | Status: DC
  Administered 2015-11-08: 1 ug/kg/h via INTRAVENOUS
  Administered 2015-11-08: 1.1 ug/kg/h via INTRAVENOUS
  Administered 2015-11-08 (×2): 0.7 ug/kg/h via INTRAVENOUS
  Administered 2015-11-08: 0.9 ug/kg/h via INTRAVENOUS
  Administered 2015-11-08: 0.4 ug/kg/h via INTRAVENOUS
  Administered 2015-11-08: 1.1 ug/kg/h via INTRAVENOUS
  Administered 2015-11-09 (×2): 0.4 ug/kg/h via INTRAVENOUS
  Administered 2015-11-10 – 2015-11-12 (×8): 1 ug/kg/h via INTRAVENOUS
  Administered 2015-11-13 (×3): 0.9 ug/kg/h via INTRAVENOUS
  Filled 2015-11-07: qty 100
  Filled 2015-11-07 (×2): qty 50
  Filled 2015-11-07 (×3): qty 100
  Filled 2015-11-07 (×2): qty 50
  Filled 2015-11-07 (×3): qty 100
  Filled 2015-11-07: qty 50
  Filled 2015-11-07 (×3): qty 100
  Filled 2015-11-07 (×2): qty 50
  Filled 2015-11-07: qty 100
  Filled 2015-11-07 (×3): qty 50
  Filled 2015-11-07: qty 100
  Filled 2015-11-07: qty 50
  Filled 2015-11-07: qty 100
  Filled 2015-11-07 (×4): qty 50

## 2015-11-07 MED ORDER — HYDROCORTISONE NA SUCCINATE PF 100 MG IJ SOLR
50.0000 mg | Freq: Four times a day (QID) | INTRAMUSCULAR | Status: DC
  Administered 2015-11-07 – 2015-11-09 (×8): 50 mg via INTRAVENOUS
  Filled 2015-11-07 (×8): qty 2

## 2015-11-07 MED ORDER — POTASSIUM CHLORIDE 10 MEQ/50ML IV SOLN
10.0000 meq | INTRAVENOUS | Status: AC
  Administered 2015-11-07 (×4): 10 meq via INTRAVENOUS
  Filled 2015-11-07 (×3): qty 50

## 2015-11-07 MED ORDER — DEXTROSE 5 % IV SOLN
30.0000 ug/min | INTRAVENOUS | Status: DC
  Administered 2015-11-07 (×2): 30 ug/min via INTRAVENOUS
  Filled 2015-11-07 (×2): qty 1

## 2015-11-07 MED ORDER — SODIUM CHLORIDE 0.9 % IV SOLN
INTRAVENOUS | Status: DC
  Administered 2015-11-07 (×2): via INTRAVENOUS

## 2015-11-07 MED ORDER — SODIUM CHLORIDE 0.9 % IV SOLN
500.0000 mg | Freq: Four times a day (QID) | INTRAVENOUS | Status: AC
  Administered 2015-11-07 – 2015-11-11 (×18): 500 mg via INTRAVENOUS
  Filled 2015-11-07 (×18): qty 500

## 2015-11-07 MED ORDER — FENTANYL CITRATE (PF) 100 MCG/2ML IJ SOLN
INTRAMUSCULAR | Status: AC
  Administered 2015-11-07: 100 ug
  Filled 2015-11-07: qty 2

## 2015-11-07 MED ORDER — VITAL HIGH PROTEIN PO LIQD
1000.0000 mL | ORAL | Status: DC
  Administered 2015-11-07: 1000 mL
  Filled 2015-11-07 (×2): qty 1000

## 2015-11-07 MED ORDER — SODIUM CHLORIDE 0.9 % IV BOLUS (SEPSIS)
500.0000 mL | Freq: Once | INTRAVENOUS | Status: AC
  Administered 2015-11-07: 500 mL via INTRAVENOUS

## 2015-11-07 MED ORDER — MIDAZOLAM HCL 2 MG/2ML IJ SOLN
INTRAMUSCULAR | Status: AC
  Administered 2015-11-07: 2 mg
  Filled 2015-11-07: qty 2

## 2015-11-07 MED ORDER — POTASSIUM CHLORIDE 20 MEQ/15ML (10%) PO SOLN
40.0000 meq | Freq: Once | ORAL | Status: AC
  Administered 2015-11-07: 40 meq
  Filled 2015-11-07: qty 30

## 2015-11-07 MED ORDER — GABAPENTIN 250 MG/5ML PO SOLN
100.0000 mg | Freq: Three times a day (TID) | ORAL | Status: DC
  Administered 2015-11-07 (×2): 100 mg
  Filled 2015-11-07 (×4): qty 2

## 2015-11-07 MED ORDER — BACLOFEN 10 MG PO TABS
5.0000 mg | ORAL_TABLET | Freq: Three times a day (TID) | ORAL | Status: DC
  Administered 2015-11-07 (×2): 5 mg via ORAL
  Filled 2015-11-07 (×3): qty 1

## 2015-11-07 NOTE — Progress Notes (Signed)
[place] Progress Note Patient Name: Amaria Zervas DOB: Oct 07, 1970 MRN: [id number]   Date of Service  11/07/2015  HPI/Events of Note  Oliguria, appears hypovolemic per RN  eICU Interventions  Normal saline IV 75cc/hr     Intervention Category Intermediate Interventions: Oliguria - evaluation and management  Simonne Maffucci 11/07/2015, 12:05 AM

## 2015-11-07 NOTE — Progress Notes (Signed)
Dr. Lake Bells updated on pt hypotension ABG f/u.  Orders for 3rd 500 cc bolus and initiate NeoSynephrine.  ABG unable to be collected by 2 RTs greater than 3attempts.  MD aware.

## 2015-11-07 NOTE — Progress Notes (Signed)
[place] Progress Note Patient Name: Yaresli Dobias DOB: 1970-04-24 MRN: [id number]   Date of Service  11/07/2015  HPI/Events of Note  Hypokalemia UOP uncertain, has been hypotensive overnight  eICU Interventions  KCl 19mEq now, day team to clarify if more needed     Intervention Category Intermediate Interventions: Electrolyte abnormality - evaluation and management  Simonne Maffucci 11/07/2015, 6:53 AM

## 2015-11-07 NOTE — Progress Notes (Addendum)
Nutrition Follow-up  DOCUMENTATION CODES:   Non-severe (moderate) malnutrition in context of acute illness/injury, Underweight  INTERVENTION:  - Vital High Protein @ 50 mL/hr which will provide 1200 kcal, 105 grams of protein, and 1003 mL free water.  - Free water flush per MD/NP. - RD will see pt on 10/19.  NUTRITION DIAGNOSIS:   Inadequate oral intake related to inability to eat as evidenced by NPO status. -ongoing  GOAL:   Patient will meet greater than or equal to 90% of their needs -unmet with TF not yet started  MONITOR:   Vent status, TF tolerance, Weight trends, Labs, Skin, I & O's  ASSESSMENT:   45 y.o. 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 Presented with worsening confusion status decreased by mouth intake foul-smelling decubitus ulcer. Apparently has been more sedated than usual at home; family did endorse that patient may have possibly received more narcotics than usual. Family also states that she has had decreased by mouth intake. Mother was very concerned that patient developed lung cancer based on the fact that her father died of it and want patient to be tested extensively. In ED patient required Narcan 2 and responded: suggestive of opioid overdose contributing to oversedation  10/18 Pt extubated yesterday at ~1020 and was subsequently re-intubated at ~0030 today d/t worsening respiratory status. OGT currently in place with ~100cc of drainage in canister and RN reports that this was from approximately 0300 this AM until this time. Estimated kcal need updated this AM and TF recommendations outlined above for when appropriate. CCM NP note from this AM states that pt will need trach. Note also states TF per  Nutrition. Will order TF as outlined above.    Patient is currently intubated on ventilator support MV: 6.6 L/min Temp (24hrs), Avg:96.5 F (35.8 C), Min:95.9 F (35.5 C), Max:98.2 F (36.8 C) Propofol: none   Medications reviewed; 100 mg Colace BID, PRN IV Zofran, 10 mEq IV KCl x4 runs today, 5 mL Senokot BID.  Labs reviewed; Na: 147 mmol/L, K: 2.8 mmol/L, Cl: 116 mmol/L, BUN: 23 mg/dL, creatinine: <0.3 mg/dL, Ca: 8.1 mg/dL.  IVF: NS @ 75 mL/hr.  Drip: Neo @ 20 mcg/min.     10/16 - Pt continues with OGT and is currently receiving Vital High Protein @ goal rate of 55 mL/hr which is providing 1320 kcal, 116 grams of protein, and 1103 mL free water.  - Estimated nutrition needs updated this AM and based on weight from 10/12 (50.4 kg) with weight +10.5 kg since that time and currently not receiving IVF.  - Order in place for 100 mL free water QID but not currently programmed into pump.  - Palliative Care note reviewed. Per CCM notes, vent weaning.   Patient is currently intubated on ventilator support MV: 6.2 L/min Temp (24hrs), Avg:100.2 F (37.9 C), Min:98.8 F (37.1 C), Max:102.2 F (39 C)   10/13 - Pt intubated at ~0020 today and OGT currently in place.  - Estimated protein needs increased based on wounds.   Patient is currently intubated on ventilator support MV: 11.9L/min Temp (24hrs), Avg:97.4 F (36.3 C), Max:98.6 F (37 C) Propofol: none BP: 88/50 and MAP: 62  IVF: D5-NS @ 50 mL/hr (204 kcal).  Drips: Fentanyl @ 250 mcg/hr, Neo @ 30 mcg/min.    Diet Order:  Diet NPO time specified  Skin:     Last BM:  10/17  Height:   Ht Readings from Last 1 Encounters:  11/07/15 5\' 2"  (1.575 m)    Weight:   Wt Readings from Last 1 Encounters:  11/06/15 135 lb 2.3 oz (61.3 kg)    Ideal Body Weight:  47.5 kg  BMI:  Body mass index is 24.72 kg/m.  Estimated Nutritional Needs:   Kcal:  1200  Protein:  100-111 (2-2.2 grams/kg)  Fluid:  >/= 1.6  L/day  EDUCATION NEEDS:   No education needs identified at this time    Jarome Matin, MS, RD, LDN Inpatient Clinical Dietitian Pager # [phone] After hours/weekend pager # [phone]

## 2015-11-07 NOTE — Progress Notes (Signed)
[place] Progress Note Patient Name: Angela Page DOB: 1970/02/24 MRN: [id number]   Date of Service  11/07/2015  HPI/Events of Note  Restless on vent  eICU Interventions  Start precedex     Intervention Category Major Interventions: Other:  Abisola Carrero 11/07/2015, 11:43 PM

## 2015-11-07 NOTE — Progress Notes (Signed)
Pharmacy Antibiotic Note  Angela Page is a 45 y.o. female admitted on 10/31/2015 with Acinetobacter UTI, possible HCAP, and decubitus ulcers. Pharmacy was consulted for Primaxin dosing.  Today, 11/07/2015: - Day #6 Primaxin (started at midnight on 10/13) - Afebrile (Tmin 95.9), WBCs remain elev (steroids), SCr low w/ CrCl ~ 85 ml/min (rounding SCr to 0.8).  Plan:  Continue Primaxin 500mg  IV q6h  Entered stop date to complete 10 full days of Primaxin per PCCM, last dose on 10/22.  Follow up renal fxn, culture results, and clinical course.   Height: 5\' 2"  (157.5 cm) Weight: 135 lb 2.3 oz (61.3 kg) IBW/kg (Calculated) : 50.1  Temp (24hrs), Avg:96.5 F (35.8 C), Min:95.9 F (35.5 C), Max:98.2 F (36.8 C)   Recent Labs Lab 10/31/15 2234 11/01/15 0205  11/02/15 0031 11/02/15 0447 11/03/15 0355 11/04/15 0431 11/05/15 0415 11/07/15 0350 11/07/15 0800  WBC  --  25.7*  --   --   --  18.2* 18.5* 17.1* 15.2*  --   CREATININE  --  0.42*  < > 0.33* 0.44 0.38* 0.34* 0.34* <0.30*  --   LATICACIDVEN 3.2* 1.2  --  1.2 1.8  --   --   --   --  0.7  < > = values in this interval not displayed.  CrCl cannot be calculated (This lab value cannot be used to calculate CrCl because it is not a number: <0.30).    Allergies  Allergen Reactions  . Erythromycin Nausea And Vomiting  . Nitrofurantoin Monohyd Macro Nausea And Vomiting  . Penicillins Hives    Has patient had a PCN reaction causing immediate rash, facial/tongue/throat swelling, SOB or lightheadedness with hypotension: Yes Has patient had a PCN reaction causing severe rash involving mucus membranes or skin necrosis: Yes Has patient had a PCN reaction that required hospitalization Yes- went to the DR Has patient had a PCN reaction occurring within the last 10 years: No-childhood allergy If all of the above answers are "NO", then may proceed with Cephalosporin use.    Antimicrobials this admission:  10/11 >> Vanc >>  10/15 10/11 >> Aztreonam >> 10/12 10/11 >> LVQ >> 10/12 10/12 >> Fluconazole >> 10/15 10/13 >> Primaxin >>  Dose adjustments this admission:   Microbiology results:  10/11 BCx: NGTD 10/11 UCx: => 100,000 colonies/mL CoNS, => 100,000 colonies/mL Acinetobacter, 50,000 colonies yeast 10/11 MRSA PCR: negative 10/13 BAL: no organisms seen  Thank you for allowing pharmacy to be a part of this patient's care.  Gretta Arab PharmD, BCPS Pager [phone] 11/07/2015 10:24 AM

## 2015-11-07 NOTE — Progress Notes (Signed)
Potassium 2.8 called to elink.  Awaiting orders.

## 2015-11-07 NOTE — Progress Notes (Signed)
   11/07/15 1300  Clinical Encounter Type  Visited With Patient and family together  Visit Type Initial;Psychological support;Spiritual support;Critical Care  Referral From Chaplain  Consult/Referral To Chaplain  Spiritual Encounters  Spiritual Needs Emotional;Other (Comment) (Pastoral Conversation/Support)  Stress Factors  Patient Stress Factors Other (Comment);Major life changes;Health changes (Breathing )  Family Stress Factors Major life changes;Health changes   I visited with the patient per referral by one of our other Chaplains.  The patient and her mother were in the room at the time of my visit.  The patient was having trouble with her ventilator at the time of my visit and stated that she was thirsty. Her mother has anxiety around her daughter's health condition.  The patient's mother stated that the patient has been through so much and that she believes that she can get through this health struggle.  The patient tried to speak during the visit, but I was unable to understand her.  The patient and her mother requested regular Spiritual Care visits while the patient is here.   Please, contact Spiritual Care for further assistance.   [place] M.Div.

## 2015-11-07 NOTE — Progress Notes (Signed)
PULMONARY / CRITICAL CARE MEDICINE   Name: Angela Page MRN: [id number] DOB: 1971/01/20    ADMISSION DATE:  10/31/2015 CONSULTATION DATE:  11/01/2015  REFERRING MD:  Dr. Roel Cluck, [facility]  CHIEF COMPLAINT:  Short of breath  SUBJECTIVE:  RN reports pt reintubated overnight - developed increased respiratory distress, poor cough & altered mental status.  She was hypotensive and placed on neo.    VITAL SIGNS: BP 118/76   Pulse 83   Temp (!) 96.6 F (35.9 C)   Resp 20   Ht '5\' 2"'$  (1.575 m)   Wt 135 lb 2.3 oz (61.3 kg)   SpO2 97%   BMI 24.72 kg/m   HEMODYNAMICS:    VENTILATOR SETTINGS: Vent Mode: CPAP;PSV FiO2 (%):  [30 %-50 %] 30 % Set Rate:  [20 bmp] 20 bmp Vt Set:  [400 mL] 400 mL PEEP:  [8 cmH20] 8 cmH20 Pressure Support:  [8 cmH20] 8 cmH20 Plateau Pressure:  [23 cmH20-26 cmH20] 26 cmH20  INTAKE / OUTPUT: I/O last 3 completed shifts: In: 3205.4 [I.V.:636.3; NG/GT:859.2; IV Piggyback:1710] Out: 3566 [Urine:3565; Stool:1]  PHYSICAL EXAMINATION: General:  Chronically ill appearing female, alert on vent HEENT:  ETT in place, no appreciable JVD, L IJ TLC c/d/i Pulmonary:   Vent supported breaths, coarse anterior, diminished lower lateral  CV: s1s2 rrr, mild tachy, no murmurs gallops or rubs Abdomen:  BSx4 active, mild distention MSK:  contractures bilaterally, no acute deformities Skin:  BLE edema 2+ bilaterally, multiple skin wounds over heels, sacral area Neurologic: Awake, alert, follows commands appropriately, no movement lower extremities, contractures right upper extremity  LABS:  BMET  Recent Labs Lab 11/04/15 0431 11/05/15 0415 11/07/15 0350  NA 142 145 147*  K 3.0* 4.1 2.8*  CL 119* 119* 116*  CO2 21* 23 26  BUN 20 26* 23*  CREATININE 0.34* 0.34* <0.30*  GLUCOSE 198* 144* 75    Electrolytes  Recent Labs Lab 11/02/15 1700 11/03/15 0355 11/03/15 1720 11/04/15 0431 11/05/15 0415 11/07/15 0350  CALCIUM  --  7.4*  --  7.8* 8.4* 8.1*  MG  1.8 1.9 1.7  --   --   --   PHOS 3.1 3.1 2.3*  --   --   --     CBC  Recent Labs Lab 11/04/15 0431 11/05/15 0415 11/07/15 0350  WBC 18.5* 17.1* 15.2*  HGB 9.2* 8.7* 7.4*  HCT 28.0* 26.9* 22.4*  PLT 258 231 168    Coag's  Recent Labs Lab 10/31/15 2234 11/02/15 0447  APTT 32 41*  INR 1.17 1.58    Sepsis Markers  Recent Labs Lab 10/31/15 2234  11/02/15 0031 11/02/15 0447 11/07/15 0800  LATICACIDVEN 3.2*  < > 1.2 1.8 0.7  PROCALCITON 0.24  --   --   --   --   < > = values in this interval not displayed.  ABG  Recent Labs Lab 11/05/15 0414 11/07/15 0005 11/07/15 0535  PHART 7.312* 7.346* 7.436  PCO2ART 46.7 49.6* 37.5  PO2ART 34.7* 59.5* 114*    Liver Enzymes  Recent Labs Lab 10/31/15 1544 11/01/15 0205  AST 13* 14*  ALT 8* 7*  ALKPHOS 178* 147*  BILITOT 0.6 0.3  ALBUMIN 1.9* 1.6*    Cardiac Enzymes No results for input(s): TROPONINI, PROBNP in the last 168 hours.  Glucose  Recent Labs Lab 11/06/15 1152 11/06/15 1626 11/06/15 2028 11/06/15 2352 11/07/15 0359 11/07/15 0804  GLUCAP 122* 101* 82 89 74 94    Imaging Dg Chest Port 1 [address]  Result Date: 11/07/2015 CLINICAL DATA:  45 year old female status post intubation. EXAM: PORTABLE CHEST 1 VIEW COMPARISON:  Chest radiograph dated 11/05/2015 FINDINGS: Endotracheal tube the tip approximately 4.3 cm above the carina. Enteric tube courses into the left upper abdomen with tip beyond the inferior margin of the image. Bilateral mid to lower lung field airspace opacities, left greater right again noted. There small bilateral pleural effusions. No pneumothorax. Stable cardiac silhouette with left pectoral pacemaker device. Cervical spine fusion screws. No acute fracture. IMPRESSION: Endotracheal tube above the carina. Bilateral interstitial and airspace densities, left greater right with no significant interval change since the prior radiograph. Small bilateral pleural effusions, right greater left.  Electronically Signed   By: Anner Crete M.D.   On: 11/07/2015 01:19     STUDIES:  CT chest 10/12 >> occlusion of Rt bronchus intermedius, basilar consolidation, small Rt effusion Bronchoscopy 10/13 >> significant purulent secretions airways CT Pelvis 10/16 >> no evidence of emphysematous fasciitis along the perineum, stage IV decubitus ulcers along the sacrum / ischial tuberosities associated with chronic osteomyelitis  CULTURES: Blood 10/11 >> neg Urine 10/11 >> Acinetobacter, yeast Sputum 10/13 >> neg  ANTIBIOTICS: Vancomycin 10/11 >> 10/15 Levaquin 10/11 >> 10/12 Aztreonam 10/11 >> 10/12 Diflucan 10/12 >> 10/14 Primaxin 10/12 >>   SIGNIFICANT EVENTS: 10/11  Admit 10/13  VDRF, pressors, bronch 10/15  Vasopressors off  10/16  Fever, weaning on PSV 10/5 10/17  Extubated, failed and reintubated overnight  LINES/TUBES: Lt femoral aline 10/13 >> 10/14 ETT 10/13 >> 10/17, 10/18 >>  Lt IJ CVL 10/13 >>  DISCUSSION: 45 yo female from home with lethargy and respiratory distress.  Found to have infected sacral wounds (present prior to this admission).  She has hx of C2-C7 posterior fusion in September 2015 after traumatic spinal cord injury with paraplegia (fell down stairs while intoxicated).  She developed progressive hypotension and respiratory failure from HCAP and a UTI leading to septic shock.  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 Hx of SSS s/p PM P:  Monitor blood pressure Continue telemetry monitoring Add stress steroids 10/18  Wean neo to off  MAP > 60 acceptable   RENAL A:   Lactic  acidosis - resolved Neurogenic bladder Hypokalemia P:   Monitor BMET and UOP Replace electrolytes as needed KVO IVF Repeat lactic acid 10/18 reassuring   GASTROINTESTINAL A:   Moderate protein calorie malnutrition Constipation  Ileus ruled out 10/16, emphysematous fasciitis as well by CT P:   TF per Nutrition Protonix for SUP Senokot-S BID   HEMATOLOGIC A:   Leukocytosis Anemia - suspect component of dilution + phlebotomy, no evidence of bleeding  P:  F/u CBC Lovenox for DVT prophylaxis Transfuse per ICU guidelines   INFECTIOUS A:   Fever - 10/16, potential sources include wounds, foley, PNA, central line HCAP Decubitus ulcers - extensive wounds on sacrum, legs, back of head Acinetobacter + Yeast UTI  P:   Continue abx as above Plan for 10 days abx, d7 F/u cultures Wound care  ENDOCRINE A:   Hyperglycemia > iatrogenic P:   Monitor glucose on BMP  NEUROLOGIC A:  C2-C7 traumatic spinal cord injury Acute metabolic encephalopathy Pain  P:   RASS goal: 0 to -1 Continue cymbalta,  Reduce Neurontin dosing  PRN fentanyl for pain  Continue home baclofen at reduced dose   Goals of care >> Palliative medicine met with family on 10/14, patient desires full code, tracheostomy if necessary. They plan further conversation considering her multiple comorbid illnesses.    Noe Gens, NP-C [facility] Pgr: [phone] or if no answer [phone] 11/07/2015, 9:52 AM  Attending Note:  I have examined patient, reviewed labs, studies and notes. I have discussed the case with B Ollis, and I agree with the data and plans as amended above. 45 yo woman, hx fall and cervical spine injury with resultant paraplegia. She was admitted with sepsis and acute resp failure requiring mechanical ventilation in setting UTI and HCAP. She has slowly improved hemodynamically, is on imipenem of acinetobacter UTI. Plan is for 10 days total. She was extubated 10/17 but  developed increased WOB and failed pm, required re-intubation. Suspect that she will need tracheostomy. Hopefully this would be a bridge to vent-independence. Unclear that she would want prolonged / indefinite MV. We will continue to discuss with her and with family.  Independent critical care time is 32 minutes.   Baltazar Apo, MD, PhD 11/07/2015, 12:04 PM [facility] [phone] or if no answer [phone]

## 2015-11-07 NOTE — Procedures (Signed)
Intubation Procedure Note Angela Page [id number] April 07, 1970  Procedure: Intubation Indications: Respiratory insufficiency  Procedure Details Consent: Unable to obtain consent because of emergent medical necessity. Time Out: Verified patient identification, verified procedure, site/side was marked, verified correct patient position, special equipment/implants available, medications/allergies/relevent history reviewed, required imaging and test results available.  Performed  Maximum sterile technique was used including antiseptics, cap, gown, hand hygiene and mask.  MAC and 3    Evaluation Hemodynamic Status: BP stable throughout; O2 sats: stable throughout Patient's Current Condition: stable Complications: No apparent complications Patient did tolerate procedure well. Chest X-ray ordered to verify placement.  CXR: pending.   Angela Page. 11/07/2015  Altered MS, severe ronchi, reduced BS rt, tachy Tolerated well  Angela Page. Titus Mould, MD, [place] Pgr: [facility]

## 2015-11-07 NOTE — Progress Notes (Addendum)
eLink Physician-Brief Progress Note Patient Name: Angela Page DOB: Nov 22, 1970 MRN: [id number]   ENTERED IN ERROR  Simonne Maffucci 11/07/2015, 5:23 AM

## 2015-11-07 NOTE — Progress Notes (Signed)
[facility] Progress Note Patient Name: Angela Page DOB: Feb 19, 1970 MRN: [id number]   Date of Service  11/07/2015  HPI/Events of Note  Hypotension persists post intubation  eICU Interventions  500cc saline bolus now  Start neosynephrine     Intervention Category Major Interventions: Hypotension - evaluation and management  Simonne Maffucci 11/07/2015, 3:31 AM

## 2015-11-07 NOTE — Progress Notes (Signed)
Pt update - Late pm pt had continued labored breathing and difficulty maintaining sats, weak cough and very little air movement.  Dr. Lake Bells aware via camera.  Dr. Titus Mould at bedside at [facility] to intubate patient.  After intubation, pt hypotensive, boluses given per elink order.  Pt sill hypotensive, 2nd IV bolus infusing.  ABG unable to collect at present, MD aware.  RT will attempt again in the hour.  Pts Mother notified of her dtrs status.

## 2015-11-07 NOTE — Evaluation (Signed)
SLP Cancellation Note  Patient Details Name: Angela Page MRN: [id number] DOB: August 22, 1970   Cancelled treatment:       Reason Eval/Treat Not Completed: Medical issues which prohibited therapy (pt back on ventilator per chart review)   [name], [facility] SLP [phone]

## 2015-11-08 ENCOUNTER — Inpatient Hospital Stay (HOSPITAL_COMMUNITY): Payer: Medicaid Other

## 2015-11-08 LAB — CBC
HCT: 22.3 % — ABNORMAL LOW (ref 36.0–46.0)
Hemoglobin: 7.5 g/dL — ABNORMAL LOW (ref 12.0–15.0)
MCH: 29.5 pg (ref 26.0–34.0)
MCHC: 33.6 g/dL (ref 30.0–36.0)
MCV: 87.8 fL (ref 78.0–100.0)
PLATELETS: 259 10*3/uL (ref 150–400)
RBC: 2.54 MIL/uL — AB (ref 3.87–5.11)
RDW: 17.2 % — AB (ref 11.5–15.5)
WBC: 9.8 10*3/uL (ref 4.0–10.5)

## 2015-11-08 LAB — BASIC METABOLIC PANEL
Anion gap: 6 (ref 5–15)
BUN: 18 mg/dL (ref 6–20)
CHLORIDE: 113 mmol/L — AB (ref 101–111)
CO2: 26 mmol/L (ref 22–32)
CREATININE: 0.32 mg/dL — AB (ref 0.44–1.00)
Calcium: 8.3 mg/dL — ABNORMAL LOW (ref 8.9–10.3)
GFR calc Af Amer: >60 mL/min (ref >60)
GFR calc non Af Amer: >60 mL/min (ref >60)
Glucose, Bld: 195 mg/dL — ABNORMAL HIGH (ref 65–99)
POTASSIUM: 3 mmol/L — AB (ref 3.5–5.1)
Sodium: 145 mmol/L (ref 135–145)

## 2015-11-08 LAB — MAGNESIUM: Magnesium: 1.8 mg/dL (ref 1.7–2.4)

## 2015-11-08 LAB — GLUCOSE, CAPILLARY
GLUCOSE-CAPILLARY: 208 mg/dL — AB (ref 65–99)
Glucose-Capillary: 176 mg/dL — ABNORMAL HIGH (ref 65–99)
Glucose-Capillary: 188 mg/dL — ABNORMAL HIGH (ref 65–99)
Glucose-Capillary: 192 mg/dL — ABNORMAL HIGH (ref 65–99)
Glucose-Capillary: 169 mg/dL — ABNORMAL HIGH (ref 65–99)
Glucose-Capillary: 226 mg/dL — ABNORMAL HIGH (ref 65–99)

## 2015-11-08 MED ORDER — CHLORHEXIDINE GLUCONATE 0.12% ORAL RINSE (MEDLINE KIT)
15.0000 mL | Freq: Two times a day (BID) | OROMUCOSAL | Status: DC
  Administered 2015-11-08 – 2015-11-27 (×37): 15 mL via OROMUCOSAL

## 2015-11-08 MED ORDER — POTASSIUM CHLORIDE 20 MEQ/15ML (10%) PO SOLN
40.0000 meq | Freq: Once | ORAL | Status: AC
  Administered 2015-11-08: 40 meq
  Filled 2015-11-08: qty 30

## 2015-11-08 MED ORDER — POTASSIUM CHLORIDE 10 MEQ/50ML IV SOLN
10.0000 meq | INTRAVENOUS | Status: AC
  Administered 2015-11-08 (×4): 10 meq via INTRAVENOUS
  Filled 2015-11-08 (×4): qty 50

## 2015-11-08 MED ORDER — VITAL HIGH PROTEIN PO LIQD
1000.0000 mL | ORAL | Status: DC
  Administered 2015-11-08: 1000 mL
  Filled 2015-11-08 (×2): qty 1000

## 2015-11-08 MED ORDER — ORAL CARE MOUTH RINSE
15.0000 mL | Freq: Four times a day (QID) | OROMUCOSAL | Status: DC
  Administered 2015-11-08 – 2015-11-27 (×61): 15 mL via OROMUCOSAL

## 2015-11-08 MED ORDER — GABAPENTIN 250 MG/5ML PO SOLN
300.0000 mg | Freq: Three times a day (TID) | ORAL | Status: DC
  Administered 2015-11-08 – 2015-11-27 (×56): 300 mg
  Filled 2015-11-08 (×61): qty 6

## 2015-11-08 MED ORDER — VENLAFAXINE HCL 50 MG PO TABS
50.0000 mg | ORAL_TABLET | Freq: Two times a day (BID) | ORAL | Status: DC
  Administered 2015-11-08 – 2015-11-14 (×12): 50 mg
  Filled 2015-11-08 (×13): qty 1

## 2015-11-08 MED ORDER — MAGNESIUM SULFATE 2 GM/50ML IV SOLN
2.0000 g | Freq: Once | INTRAVENOUS | Status: AC
  Administered 2015-11-08: 2 g via INTRAVENOUS
  Filled 2015-11-08: qty 50

## 2015-11-08 MED ORDER — BACLOFEN 10 MG PO TABS
10.0000 mg | ORAL_TABLET | Freq: Three times a day (TID) | ORAL | Status: DC
  Administered 2015-11-08 – 2015-11-27 (×57): 10 mg via ORAL
  Filled 2015-11-08 (×55): qty 1

## 2015-11-08 MED ORDER — INSULIN ASPART 100 UNIT/ML ~~LOC~~ SOLN
0.0000 [IU] | SUBCUTANEOUS | Status: DC
  Administered 2015-11-08: 2 [IU] via SUBCUTANEOUS
  Administered 2015-11-08: 5 [IU] via SUBCUTANEOUS
  Administered 2015-11-08: 3 [IU] via SUBCUTANEOUS
  Administered 2015-11-09: 2 [IU] via SUBCUTANEOUS
  Administered 2015-11-09: 3 [IU] via SUBCUTANEOUS
  Administered 2015-11-09 – 2015-11-10 (×5): 2 [IU] via SUBCUTANEOUS
  Administered 2015-11-10: 1 [IU] via SUBCUTANEOUS
  Administered 2015-11-10: 2 [IU] via SUBCUTANEOUS
  Administered 2015-11-10: 1 [IU] via SUBCUTANEOUS
  Administered 2015-11-10: 2 [IU] via SUBCUTANEOUS
  Administered 2015-11-11 (×4): 1 [IU] via SUBCUTANEOUS
  Administered 2015-11-12: 2 [IU] via SUBCUTANEOUS
  Administered 2015-11-15 – 2015-11-22 (×20): 1 [IU] via SUBCUTANEOUS
  Administered 2015-11-25: 2 [IU] via SUBCUTANEOUS
  Administered 2015-11-25 – 2015-11-27 (×5): 1 [IU] via SUBCUTANEOUS

## 2015-11-08 MED ORDER — OXYCODONE HCL 5 MG PO TABS
5.0000 mg | ORAL_TABLET | Freq: Four times a day (QID) | ORAL | Status: DC | PRN
  Administered 2015-11-08 – 2015-11-27 (×49): 5 mg via ORAL
  Filled 2015-11-08 (×52): qty 1

## 2015-11-08 NOTE — Progress Notes (Signed)
Date:  November 08, 2015 Chart reviewed for concurrent status and case management needs. Will continue to follow the patient for status change: Remains on full vent support Discharge Planning: following for needs Expected discharge date: [id number] Velva Harman, BSN, [place], [place]

## 2015-11-08 NOTE — Progress Notes (Signed)
PULMONARY / CRITICAL CARE MEDICINE   Name: Angela Page MRN: [id number] DOB: 09/23/1970    ADMISSION DATE:  10/31/2015 CONSULTATION DATE:  11/01/2015  REFERRING MD:  Dr. Roel Cluck, [facility]  CHIEF COMPLAINT:  Short of breath  SUBJECTIVE:  RN reports pt agitated overnight, started on precedex.  Pt reports muscle spasms / pain.  Afebrile, VSS.  Weaning on PSV.  Neo off since 11am yesterday.   VITAL SIGNS: BP (!) 128/92   Pulse 80   Temp 97.9 F (36.6 C)   Resp (!) 22   Ht _0  (1.575 m)   Wt 132 lb 0.9 oz (59.9 kg)   SpO2 99%   BMI 24.15 kg/m   HEMODYNAMICS:    VENTILATOR SETTINGS: Vent Mode: PRVC FiO2 (%):  [30 %] 30 % Set Rate:  [20 bmp-24 bmp] 20 bmp Vt Set:  [400 mL] 400 mL PEEP:  [8 cmH20] 8 cmH20 Pressure Support:  [8 cmH20] 8 cmH20 Plateau Pressure:  [18 cmH20-23 cmH20] 18 cmH20  INTAKE / OUTPUT: I/O last 3 completed shifts: In: 5412 [I.V.:2628.6; NG/GT:783.3; IV Piggyback:2000] Out: 2885 [Urine:2885]  PHYSICAL EXAMINATION: General:  Chronically ill appearing female, alert on vent HEENT:  ETT in place, no appreciable JVD, L IJ TLC c/d/i Pulmonary:   Vent supported breaths, coarse anterior, diminished lower lateral  CV: s1s2 rrr, mild tachy, no murmurs gallops or rubs Abdomen:  BSx4 active, mild distention MSK:  contractures bilaterally, no acute deformities Skin:  BLE edema 2+ bilaterally, multiple skin wounds over heels, sacral area Neurologic: Awake, alert, follows commands appropriately, no movement lower extremities, contractures right upper extremity  LABS:  BMET  Recent Labs Lab 11/05/15 0415 11/07/15 0350 11/08/15 0530  NA 145 147* 145  K 4.1 2.8* 3.0*  CL 119* 116* 113*  CO2 _1 BUN 26* 23* 18  CREATININE 0.34* <0.30* 0.32*  GLUCOSE 144* 75 195*    Electrolytes  Recent Labs Lab 11/02/15 1700 11/03/15 0355 11/03/15 1720  11/05/15 0415 11/07/15 0350 11/08/15 0530  CALCIUM  --  7.4*  --   < > 8.4* 8.1* 8.3*  MG 1.8 1.9  1.7  --   --   --  1.8  PHOS 3.1 3.1 2.3*  --   --   --   --   < > = values in this interval not displayed.  CBC  Recent Labs Lab 11/05/15 0415 11/07/15 0350 11/08/15 0530  WBC 17.1* 15.2* 9.8  HGB 8.7* 7.4* 7.5*  HCT 26.9* 22.4* 22.3*  PLT 231 168 259    Coag's  Recent Labs Lab 11/02/15 0447  APTT 41*  INR 1.58    Sepsis Markers  Recent Labs Lab 11/02/15 0031 11/02/15 0447 11/07/15 0800  LATICACIDVEN 1.2 1.8 0.7    ABG  Recent Labs Lab 11/05/15 0414 11/07/15 0005 11/07/15 0535  PHART 7.312* 7.346* 7.436  PCO2ART 46.7 49.6* 37.5  PO2ART 34.7* 59.5* 114*    Liver Enzymes No results for input(s): AST, ALT, ALKPHOS, BILITOT, ALBUMIN in the last 168 hours.  Cardiac Enzymes No results for input(s): TROPONINI, PROBNP in the last 168 hours.  Glucose  Recent Labs Lab 11/07/15 1216 11/07/15 1636 11/07/15 1930 11/07/15 2346 11/08/15 0405 11/08/15 0736  GLUCAP 84 101* 114* 176* 188* 192*    Imaging Dg Chest Port 1 View  Result Date: 11/08/2015 CLINICAL DATA:  Respiratory failure. EXAM: PORTABLE CHEST 1 VIEW COMPARISON:  11/07/2015. FINDINGS: Endotracheal tube and NG tube in stable position. Cardiac pacer stable position. Heart  size stable. Mild bilateral scratch chest diffuse bilateral airspace disease, no change. Small right pleural effusion. Small bilateral pleural effusions. No change. No pneumothorax. IMPRESSION: 1. Lines and tubes in stable position. 2. Stable diffuse bilateral airspace disease small bilateral pleural effusions. No change from prior exam P Electronically Signed   By: Marcello Moores  Register   On: 11/08/2015 06:50     STUDIES:  CT chest 10/12 >> occlusion of Rt bronchus intermedius, basilar consolidation, small Rt effusion Bronchoscopy 10/13 >> significant purulent secretions airways CT Pelvis 10/16 >> no evidence of emphysematous fasciitis along the perineum, stage IV decubitus ulcers along the sacrum / ischial tuberosities associated  with chronic osteomyelitis  CULTURES: Blood 10/11 >> neg Urine 10/11 >> Acinetobacter, yeast Sputum 10/13 >> neg  ANTIBIOTICS: Vancomycin 10/11 >> 10/15 Levaquin 10/11 >> 10/12 Aztreonam 10/11 >> 10/12 Diflucan 10/12 >> 10/14 Primaxin 10/12 >>   SIGNIFICANT EVENTS: 10/11  Admit 10/13  VDRF, pressors, bronch 10/15  Vasopressors off  10/16  Fever, weaning on PSV 10/5 10/17  Extubated, failed and reintubated overnight  LINES/TUBES: Lt femoral aline 10/13 >> 10/14 ETT 10/13 >> 10/17, 10/18 >>  Lt IJ CVL 10/13 >>  DISCUSSION: 45 yo female from home with lethargy and respiratory distress.  Found to have infected sacral wounds (present prior to this admission).  She has hx of C2-C7 posterior fusion in September 2015 after traumatic spinal cord injury with paraplegia (fell down stairs while intoxicated).  She developed progressive hypotension and respiratory failure from HCAP and a UTI leading to septic shock.  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, recent steroids as outpatient Hx of SSS s/p PM P:  Monitor blood pressure Continue telemetry monitoring Stress steroids added 10/18  MAP > 60 acceptable   RENAL A:   Lactic acidosis - resolved Neurogenic bladder Hypokalemia P:   Monitor BMET and UOP Replace electrolytes as needed NS @ 50 ml/hr  GASTROINTESTINAL A:   Moderate protein calorie malnutrition Constipation  Ileus ruled out 10/16, emphysematous fasciitis as well by CT P:   TF per Nutrition Protonix for SUP Senokot-S BID  Will ask IR to review for PEG once Trach planned  HEMATOLOGIC A:    Leukocytosis Anemia - suspect component of dilution + phlebotomy, no evidence of bleeding  P:  F/u CBC Lovenox for DVT prophylaxis Transfuse per ICU guidelines   INFECTIOUS A:   Fever - 10/16, potential sources include wounds, foley, PNA, central line HCAP Decubitus ulcers - extensive wounds on sacrum, legs, back of head Acinetobacter + Yeast UTI  P:   Continue abx as above Plan for 10 days abx, d7 Wound care  ENDOCRINE A:   Hyperglycemia - iatrogenic, stress steroids P:   Monitor glucose on BMP Sensitive SSI while on stress steroids  NEUROLOGIC A:   C2-C7 traumatic spinal cord injury Acute metabolic encephalopathy Pain  P:   RASS goal: 0  Continue effexor (unable to crush cymbalta) Neurontin 300 mg TID Resume home oxycodone PRN PRN fentanyl for pain  Continue home baclofen at reduced dose, 10 mg TID Likely will need LTAC for rehab efforts if patient  willing    Goals of care >> Palliative medicine met with family on 10/14, patient desires full code, tracheostomy if necessary. They plan further conversation considering her multiple comorbid illnesses.   Family:  Mother updated via phone 10/18.  Extensive discussion with her again regarding the nature of Kim's injury and how it leads to chronic illness.  Mother expressed that she does not think [name] would want to live in a facility on mechanical ventilation but she needs to speak to her about it.      Noe Gens, NP-C [facility] Pgr: [phone] or if no answer [phone] 11/08/2015, 9:29 AM   Attending Note:  I have examined patient, reviewed labs, studies and notes. I have discussed the case with B Ollis, and I agree with the data and plans as amended above. 45 yo woman, hx fall and cervical spine injury with resultant paraplegia. She was admitted with sepsis and acute resp failure requiring mechanical ventilation in setting UTI and HCAP. She has improved hemodynamically, is on imipenem of  acinetobacter UTI d 7 of 10. She was extubated 10/17 but developed increased WOB and failed pm, required re-intubation. She will need tracheostomy. Hopefully this would be a bridge to vent-independence although unclear. She may require LTAC or vent-snf. We will continue to discuss with her and with family. Restarting her oxycodone, continuing neurontin, effexor, baclofen. Will address possible trach with them if she does not make significant progress rest of the week.   Baltazar Apo, MD, PhD 11/08/2015, 10:53 AM [facility] [phone] or if no answer [phone]

## 2015-11-08 NOTE — Progress Notes (Signed)
Nutrition Follow-up  DOCUMENTATION CODES:   Non-severe (moderate) malnutrition in context of acute illness/injury, Underweight  INTERVENTION:  - Increase Vital High Protein to 60 mL/hr which will provide 1440 kcal, 126 grams of protein, and 1203 mL free water.  - Continue PEPuP protocol. - RD will follow-up 10/20.  NUTRITION DIAGNOSIS:   Inadequate oral intake related to inability to eat as evidenced by NPO status. -ongoing  GOAL:   Patient will meet greater than or equal to 90% of their needs -met with current TF regimen  MONITOR:   Vent status, TF tolerance, Weight trends, Labs, Skin, I & O's  ASSESSMENT:   45 y.o. 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 Presented with worsening confusion status decreased by mouth intake foul-smelling decubitus ulcer. Apparently has been more sedated than usual at home; family did endorse that patient may have possibly received more narcotics than usual. Family also states that she has had decreased by mouth intake. Mother was very concerned that patient developed lung cancer based on the fact that her father died of it and want patient to be tested extensively. In ED patient required Narcan 2 and responded: suggestive of opioid overdose contributing to oversedation  10/19 Pt with OGT in place. She indicates that she is having nausea and abdominal pain at time of RD visit; RN was alerted and provided pt with PRN antinausea medication. Pt currently receiving Vital High Protein @ 50 mL/hr with 30 mL free water every 4 hours. This regimen is providing 1200 kcal, 105 grams of protein, and 1183 mL free water.  Estimated nutrition needs updated and based on CBW of 59.9 kg as weight has been mainly  stable since 10/15. Will adjust TF regimen as outlined above and follow-up tomorrow.   Per CCM note, MAP >60 acceptable for this pt. Note also indicates plan for PEG evaluation once trach is planned.  Patient is currently intubated on ventilator support MV: 8.6 L/min Temp (24hrs), Avg:97.7 F (36.5 C), Min:96.8 F (36 C), Max:98.8 F (37.1 C)  Medications reviewed; 100 mg Colace BID, 50 mg IV Solu-cortef QID, sliding scale Novolog, 2 g IV Mg sulfate x1 dose today, PRN IV Zofran, 10 mEq IV KCl x4 runs today, 40 mEq KCl per OGT x1 dose today, 5 mL Senokot BID.  Labs reviewed; CBGs: 169-192 mg/dL, K: 3 mmol/L, Cl: 113 mmol/L, creatinine: 0.32 mg/dL, Ca: 8.3 mg/dL.  IVF: NS @ 50 mL/hr. Drip: Precedex @ 0.9 mcg/kg/hr.     10/18 - Pt extubated yesterday at ~1020 and was subsequently re-intubated at ~0030 today d/t worsening respiratory status.  - OGT currently in place with ~100cc of drainage in canister.  - Estimated kcal need updated this AM. - CCM NP note states that pt will need trach.  Patient is currently intubated on ventilator support MV: 6.6 L/min Temp (24hrs), Avg:96.5 F (35.8 C), Max:98.2 F (36.8 C)  IVF: NS @ 75 mL/hr.  Drip: Neo @ 20 mcg/min.     10/16 - Pt continues with OGT and is currently receiving Vital High Protein @ goal rate of 55 mL/hr which is providing 1320 kcal, 116 grams of protein, and 1103 mL free water.  - Estimated nutrition needs updated this AM and based on weight from 10/12 (50.4 kg) with weight +10.5 kg since that time and currently  not receiving IVF.  - Order in place for 100 mL free water QID but not currently programmed into pump.  - Palliative Care note reviewed. Per CCM notes, vent weaning.   Patient is currently intubated on ventilator support MV: 6.2L/min Temp (24hrs), Avg:100.2 F (37.9 C), Min:98.8 F (37.1 C), Max:102.2 F (39 C)   Diet Order:  Diet NPO time specified  Skin: Stage 4 pressure injuries to coccyx and  bilateral buttocks, Stage 1 pressure injuries to mid, upper, bilateral back and L shoulder, Unstageable wound to cervical area.  Last BM:  10/19  Height:   Ht Readings from Last 1 Encounters:  11/07/15 '5\' 2"'$  (1.575 m)    Weight:   Wt Readings from Last 1 Encounters:  11/08/15 132 lb 0.9 oz (59.9 kg)    Ideal Body Weight:  47.5 kg  BMI:  Body mass index is 24.15 kg/m.  Estimated Nutritional Needs:   Kcal:  5956  Protein:  120 grams (2 grams/kg)  Fluid:  >/= 1.6 L/day  EDUCATION NEEDS:   No education needs identified at this time    Jarome Matin, MS, RD, LDN Inpatient Clinical Dietitian Pager # [phone] After hours/weekend pager # [phone]

## 2015-11-08 NOTE — Progress Notes (Signed)
[facility] ADULT ICU REPLACEMENT PROTOCOL FOR AM LAB REPLACEMENT ONLY  The patient does apply for the [facility] Adult ICU Electrolyte Replacment Protocol based on the criteria listed below:   1. Is GFR >/= 40 ml/min? Yes.    Patient's GFR today is >60 2. Is urine output >/= 0.5 ml/kg/hr for the last 6 hours? Yes.   Patient's UOP is 5.4 ml/kg/hr 3. Is BUN < 60 mg/dL? Yes.    Patient's BUN today is >60 4. Abnormal electrolyte(s):K3.0,Mg 1.8 5. Ordered repletion with: Per protocol 6. If a panic level lab has been reported, has the CCM MD in charge been notified? Yes.  .   Physician:  Lahoma Rocker, MD  Vear Clock 11/08/2015 6:40 AM

## 2015-11-08 NOTE — Evaluation (Signed)
SLP Cancellation Note  Patient Details Name: Angela Page MRN: [id number] DOB: Jun 06, 1970   Cancelled treatment:       Reason Eval/Treat Not Completed: Medical issues which prohibited therapy (pt remains on vent, will sign off, please reorder if indicated)   [name], [facility] SLP [phone]

## 2015-11-09 ENCOUNTER — Inpatient Hospital Stay (HOSPITAL_COMMUNITY): Payer: Medicaid Other

## 2015-11-09 LAB — BASIC METABOLIC PANEL
ANION GAP: 3 — AB (ref 5–15)
BUN: 18 mg/dL (ref 6–20)
CO2: 29 mmol/L (ref 22–32)
Calcium: 7.7 mg/dL — ABNORMAL LOW (ref 8.9–10.3)
Chloride: 113 mmol/L — ABNORMAL HIGH (ref 101–111)
Creatinine, Ser: <0.3 mg/dL — ABNORMAL LOW (ref 0.44–1.00)
GLUCOSE: 219 mg/dL — AB (ref 65–99)
POTASSIUM: 3.4 mmol/L — AB (ref 3.5–5.1)
Sodium: 145 mmol/L (ref 135–145)

## 2015-11-09 LAB — CBC
HCT: 23.8 % — ABNORMAL LOW (ref 36.0–46.0)
HEMOGLOBIN: 7.9 g/dL — AB (ref 12.0–15.0)
MCH: 29.3 pg (ref 26.0–34.0)
MCHC: 33.2 g/dL (ref 30.0–36.0)
MCV: 88.1 fL (ref 78.0–100.0)
Platelets: 383 10*3/uL (ref 150–400)
RBC: 2.7 MIL/uL — AB (ref 3.87–5.11)
RDW: 17.3 % — ABNORMAL HIGH (ref 11.5–15.5)
WBC: 13.4 10*3/uL — ABNORMAL HIGH (ref 4.0–10.5)

## 2015-11-09 LAB — GLUCOSE, CAPILLARY
GLUCOSE-CAPILLARY: 166 mg/dL — AB (ref 65–99)
GLUCOSE-CAPILLARY: 176 mg/dL — AB (ref 65–99)
GLUCOSE-CAPILLARY: 192 mg/dL — AB (ref 65–99)
Glucose-Capillary: 171 mg/dL — ABNORMAL HIGH (ref 65–99)
Glucose-Capillary: 190 mg/dL — ABNORMAL HIGH (ref 65–99)
Glucose-Capillary: 192 mg/dL — ABNORMAL HIGH (ref 65–99)
Glucose-Capillary: 220 mg/dL — ABNORMAL HIGH (ref 65–99)

## 2015-11-09 LAB — MAGNESIUM: Magnesium: 2 mg/dL (ref 1.7–2.4)

## 2015-11-09 MED ORDER — HYDROCORTISONE NA SUCCINATE PF 100 MG IJ SOLR
50.0000 mg | Freq: Two times a day (BID) | INTRAMUSCULAR | Status: DC
  Administered 2015-11-09 – 2015-11-11 (×4): 50 mg via INTRAVENOUS
  Filled 2015-11-09 (×4): qty 2

## 2015-11-09 MED ORDER — VITAL AF 1.2 CAL PO LIQD
1000.0000 mL | ORAL | Status: DC
  Administered 2015-11-09 – 2015-11-11 (×3): 1000 mL
  Filled 2015-11-09 (×4): qty 1000

## 2015-11-09 MED ORDER — PROPOFOL 500 MG/50ML IV EMUL
5.0000 ug/kg/min | INTRAVENOUS | Status: DC
  Administered 2015-11-12: 70 ug/kg/min via INTRAVENOUS
  Filled 2015-11-09: qty 50

## 2015-11-09 MED ORDER — FENTANYL CITRATE (PF) 100 MCG/2ML IJ SOLN
200.0000 ug | Freq: Once | INTRAMUSCULAR | Status: AC
  Administered 2015-11-12: 200 ug via INTRAVENOUS
  Filled 2015-11-09: qty 4

## 2015-11-09 MED ORDER — FAMOTIDINE IN NACL 20-0.9 MG/50ML-% IV SOLN
20.0000 mg | Freq: Two times a day (BID) | INTRAVENOUS | Status: DC
  Administered 2015-11-09 – 2015-11-10 (×2): 20 mg via INTRAVENOUS
  Filled 2015-11-09 (×2): qty 50

## 2015-11-09 MED ORDER — POTASSIUM CHLORIDE 20 MEQ/15ML (10%) PO SOLN
40.0000 meq | Freq: Once | ORAL | Status: AC
  Administered 2015-11-09: 40 meq
  Filled 2015-11-09: qty 30

## 2015-11-09 MED ORDER — PRO-STAT SUGAR FREE PO LIQD
30.0000 mL | Freq: Three times a day (TID) | ORAL | Status: DC
  Administered 2015-11-09 – 2015-11-11 (×9): 30 mL
  Filled 2015-11-09 (×9): qty 30

## 2015-11-09 MED ORDER — VECURONIUM BROMIDE 10 MG IV SOLR
10.0000 mg | Freq: Once | INTRAVENOUS | Status: DC
  Filled 2015-11-09: qty 10

## 2015-11-09 MED ORDER — ETOMIDATE 2 MG/ML IV SOLN
40.0000 mg | Freq: Once | INTRAVENOUS | Status: AC
  Administered 2015-11-12: 20 mg via INTRAVENOUS
  Filled 2015-11-09: qty 20

## 2015-11-09 MED ORDER — MIDAZOLAM HCL 2 MG/2ML IJ SOLN
4.0000 mg | Freq: Once | INTRAMUSCULAR | Status: AC
  Administered 2015-11-12: 4 mg via INTRAVENOUS
  Filled 2015-11-09: qty 4

## 2015-11-09 NOTE — Progress Notes (Signed)
[facility] Progress Note Patient Name: Angela Page DOB: 06-Jan-1971 MRN: [id number]   Date of Service  11/09/2015  HPI/Events of Note  No stress ulcer prophylaxis ordered. Progress note states patient is on Protonix. However, there is a shortage of Protonix IV.   eICU Interventions  Will order: 1. Pepcid 20 mg IV BID.      Intervention Category Intermediate Interventions: Best-practice therapies (e.g. DVT, beta blocker, etc.)  Nikitta Sobiech [place] 11/09/2015, 8:24 PM

## 2015-11-09 NOTE — Progress Notes (Signed)
Inpatient Diabetes Program Recommendations  AACE/ADA: New Consensus Statement on Inpatient Glycemic Control (2015)  Target Ranges:  Prepandial:   less than 140 mg/dL      Peak postprandial:   less than 180 mg/dL (1-2 hours)      Critically ill patients:  140 - 180 mg/dL   Results for Angela Page, Angela Page (MRN [id number]) as of 11/09/2015 07:42  Ref. Range 11/07/2015 23:46 11/08/2015 04:05 11/08/2015 07:36 11/08/2015 11:43 11/08/2015 15:15 11/08/2015 19:50  Glucose-Capillary Latest Ref Range: 65 - 99 mg/dL 176 (H) 188 (H) 192 (H) 169 (H) 226 (H) 208 (H)   Results for Angela Page, Angela Page (MRN [id number]) as of 11/09/2015 07:42  Ref. Range 11/09/2015 00:17 11/09/2015 03:12  Glucose-Capillary Latest Ref Range: 65 - 99 mg/dL 192 (H) 220 (H)    Current Insulin Orders: Novolog Sensitive Correction Scale/ SSI (0-9 units) Q4 hours      -Note patient currently Intubated.  Receiving tube feeds at 60cc/hour.  -Novolog SSI coverage started yesterday at 12pm.  -Patient also getting Solucortef 50 mg Q6 hours.      MD- Please consider adding low dose Novolog tube feed coverage to patient's current Insulin regimen:  Novolog 3 units Q4 hours (hold if tube feeds held for any reason)      --Will follow patient during hospitalization--  Wyn Quaker RN, MSN, CDE Diabetes Coordinator Inpatient Glycemic Control Team Team Pager: [phone] (8a-5p)

## 2015-11-09 NOTE — Progress Notes (Signed)
Pharmacy Antibiotic Note  Angela Page is a 45 y.o. female admitted on 10/31/2015 with Acinetobacter UTI, possible HCAP, and decubitus ulcers. Pharmacy was consulted for Primaxin dosing.  Today, 11/09/2015: - Day #8 Primaxin (started at midnight on 10/13) - Afebrile  - renal: SCr remains low - WBC up today  Plan:  Continue Primaxin 500mg  IV q6h  Entered stop date to complete 10 full days of Primaxin per PCCM, last dose on 10/22.  Follow up renal fxn, culture results, and clinical course.   Height: 5\' 2"  (157.5 cm) Weight: 135 lb 12.9 oz (61.6 kg) IBW/kg (Calculated) : 50.1  Temp (24hrs), Avg:98.7 F (37.1 C), Min:98.1 F (36.7 C), Max:99.1 F (37.3 C)   Recent Labs Lab 11/04/15 0431 11/05/15 0415 11/07/15 0350 11/07/15 0800 11/08/15 0530 11/09/15 0621  WBC 18.5* 17.1* 15.2*  --  9.8 13.4*  CREATININE 0.34* 0.34* <0.30*  --  0.32* <0.30*  LATICACIDVEN  --   --   --  0.7  --   --     CrCl cannot be calculated (This lab value cannot be used to calculate CrCl because it is not a number: <0.30).    Allergies  Allergen Reactions  . Erythromycin Nausea And Vomiting  . Nitrofurantoin Monohyd Macro Nausea And Vomiting  . Penicillins Hives    Has patient had a PCN reaction causing immediate rash, facial/tongue/throat swelling, SOB or lightheadedness with hypotension: Yes Has patient had a PCN reaction causing severe rash involving mucus membranes or skin necrosis: Yes Has patient had a PCN reaction that required hospitalization Yes- went to the DR Has patient had a PCN reaction occurring within the last 10 years: No-childhood allergy If all of the above answers are "NO", then may proceed with Cephalosporin use.    Antimicrobials this admission:  10/11 >> Vanc >> 10/15 10/11 >> Aztreonam >> 10/12 10/11 >> LVQ >> 10/12 10/12 >> Fluconazole >> 10/15 10/13 >> Primaxin >>  Dose adjustments this admission:   Microbiology results:  10/11 BCx: NGTD 10/11 UCx: =>  100,000 colonies/mL CoNS, => 100,000 colonies/mL Acinetobacter, 50,000 colonies yeast 10/11 MRSA PCR: negative 10/13 BAL: no organisms seen  Thank you for allowing pharmacy to be a part of this patient's care.  Doreene Eland, PharmD, BCPS.   Pager: [phone] 11/09/2015 10:27 AM

## 2015-11-09 NOTE — Progress Notes (Signed)
Nutrition Follow-up  DOCUMENTATION CODES:   Non-severe (moderate) malnutrition in context of acute illness/injury, Underweight  INTERVENTION:  - Will change TF to Vital 1.2 @ 45 mL/hr with 30 mL Prostat TID which will provide 1596 kcal, 126 grams of protein, and 876 mL free water.  - Continue PEPuP protocol. - RD will follow-up 10/23.  NUTRITION DIAGNOSIS:   Inadequate oral intake related to inability to eat as evidenced by NPO status. -ongoing  GOAL:   Patient will meet greater than or equal to 90% of their needs -met with current TF regimen.  MONITOR:   Vent status, TF tolerance, Weight trends, Labs, Skin, I & O's  ASSESSMENT:   45 y.o. 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 Presented with worsening confusion status decreased by mouth intake foul-smelling decubitus ulcer. Apparently has been more sedated than usual at home; family did endorse that patient may have possibly received more narcotics than usual. Family also states that she has had decreased by mouth intake. Mother was very concerned that patient developed lung cancer based on the fact that her father died of it and want patient to be tested extensively. In ED patient required Narcan 2 and responded: suggestive of opioid overdose contributing to oversedation  10/20 Pt continues with OGT. She denies abdominal pain this AM but states she is having some nausea. She continuously points to LLQ but unable to understand what pt is trying to communicate concerning this. Informed RN of pt's discomfort. Per chart review, weight +1.7 kg since yesterday. Weight from yesterday (59.9 kg) used in re-estimating kcal need.   Pt currently receiving Vital High Protein @ 60 mL/hr  with 30 mL free water every 4 hours. This regimen is providing 1440 kcal, 126 grams of protein, and 1384 mL free water. Will adjust TF as outlined above to more appropriately meet re-estimated kcal need.   Patient is currently intubated on ventilator support MV: 8.1 L/min Temp (24hrs), Avg:98.7 F (37.1 C), Min:98.1 F (36.7 C), Max:99.1 F (37.3 C)  Medications reviewed; 100 mg Colace BID, 50 mg Solu-cortef QID, sliding scale Novolog, PRN IV Zofran, 10 mEq IV KCl x4 runs yesterday, 40 mEq KCl via OGT x1 dose yesterday, 5 mL Senokot BID.  Labs reviewed; CBGs: 192-220 mg/dL, K: 3.4 mmol/L, Cl: 113 mmol/L, creatinine: 0.3 mg/dL, Ca: 7.7 mg/dL.  IVF: NS @ 50 mL/hr.  Drip: Precedex @ 0.4 mcg/kg/hr.    10/19 - Pt with OGT in place.  - She indicates that she is having nausea and abdominal pain at time of RD visit; RN was alerted and provided pt with PRN antinausea medication.  - Pt currently receiving Vital High Protein @ 50 mL/hr with 30 mL free water every 4 hours. - This regimen is providing 1200 kcal, 105 grams of protein, and 1183 mL free water. - Estimated nutrition needs updated and based on CBW of 59.9 kg as weight has been mainly stable since 10/15.  - Per CCM note, MAP >60 acceptable for this pt. Note also indicates plan for PEG evaluation once trach is planned.  Patient is currently intubated on ventilator support MV: 8.6 L/min Temp (24hrs), Avg:97.7 F (36.5 C), Min:96.8 F (36 C), Max:98.8 F (37.1 C)  IVF: NS @ 50 mL/hr. Drip: Precedex @ 0.9 mcg/kg/hr.     10/18 - Pt   extubated yesterday at ~1020 and was subsequently re-intubated at ~0030 today d/t worsening respiratory status.  - OGT currently in place with ~100cc of drainage in canister.  - Estimated kcal need updated this AM. - CCM NP note states that pt will need trach.  Patient is currently intubated on ventilator support MV: 6.6L/min Temp (24hrs), Avg:96.5 F (35.8 C), Max:98.2 F (36.8 C)  IVF: NS  @ 75 mL/hr.  Drip:Neo @ 20 mcg/min.    Diet Order:  Diet NPO time specified  Skin:  Stage 4 pressure injuries to coccyx and bilateral buttocks, Stage 1 pressure injuries to mid, upper, bilateral back and L shoulder, Unstageable wound to cervical area.  Last BM:  10/19  Height:   Ht Readings from Last 1 Encounters:  11/07/15 5' 2" (1.575 m)    Weight:   Wt Readings from Last 1 Encounters:  11/09/15 135 lb 12.9 oz (61.6 kg)    Ideal Body Weight:  47.5 kg  BMI:  Body mass index is 24.84 kg/m.  Estimated Nutritional Needs:   Kcal:  1580  Protein:  120 grams (2 grams/kg)  Fluid:  >/= 1.6 L/day  EDUCATION NEEDS:   No education needs identified at this time    Jarome Matin, MS, RD, LDN Inpatient Clinical Dietitian Pager # [phone] After hours/weekend pager # [phone]

## 2015-11-09 NOTE — Progress Notes (Signed)
PULMONARY / CRITICAL CARE MEDICINE   Name: Angela Page MRN: [id number] DOB: 04-17-1970    ADMISSION DATE:  10/31/2015 CONSULTATION DATE:  11/01/2015  REFERRING MD:  Dr. Roel Cluck, [facility]  CHIEF COMPLAINT:  Short of breath  SUBJECTIVE:  Pt requesting oxycodone, indicates pain in throat, generalized discomfort.  No acute events overnight.  Weaning on PSV well.    VITAL SIGNS: BP 101/76   Pulse 80   Temp 98.4 F (36.9 C)   Resp (!) 24   Ht _0  (1.575 m)   Wt 135 lb 12.9 oz (61.6 kg)   SpO2 100%   BMI 24.84 kg/m   HEMODYNAMICS:    VENTILATOR SETTINGS: Vent Mode: PRVC FiO2 (%):  [30 %] 30 % Set Rate:  [20 bmp] 20 bmp Vt Set:  [400 mL] 400 mL PEEP:  [8 cmH20] 8 cmH20 Pressure Support:  [5 cmH20] 5 cmH20 Plateau Pressure:  [18 cmH20-24 cmH20] 24 cmH20  INTAKE / OUTPUT: I/O last 3 completed shifts: In: 4098 [I.V.:2473; NG/GT:1521; IV Piggyback:650] Out: 1191 [Urine:3470]  PHYSICAL EXAMINATION: General:  Chronically ill appearing female, alert on vent HEENT:  ETT in place, no appreciable JVD, L IJ TLC c/d/i Pulmonary:   Vent supported breaths, coarse anterior, diminished lower lateral  CV: s1s2 rrr, no murmurs gallops or rubs Abdomen:  BSx4 active, soft MSK:  contractures bilaterally, no acute deformities Skin:  BLE edema 2+ bilaterally, multiple skin wounds over heels, sacral area Neurologic: Awake, alert, follows commands appropriately, no movement lower extremities, contractures right upper extremity  LABS:  BMET  Recent Labs Lab 11/07/15 0350 11/08/15 0530 11/09/15 0621  NA 147* 145 145  K 2.8* 3.0* 3.4*  CL 116* 113* 113*  CO2 _1 BUN 23* 18 18  CREATININE <0.30* 0.32* <0.30*  GLUCOSE 75 195* 219*    Electrolytes  Recent Labs Lab 11/02/15 1700 11/03/15 0355 11/03/15 1720  11/07/15 0350 11/08/15 0530 11/09/15 0621  CALCIUM  --  7.4*  --   < > 8.1* 8.3* 7.7*  MG 1.8 1.9 1.7  --   --  1.8 2.0  PHOS 3.1 3.1 2.3*  --   --   --   --    < > = values in this interval not displayed.  CBC  Recent Labs Lab 11/07/15 0350 11/08/15 0530 11/09/15 0621  WBC 15.2* 9.8 13.4*  HGB 7.4* 7.5* 7.9*  HCT 22.4* 22.3* 23.8*  PLT 168 259 383    Coag's No results for input(s): APTT, INR in the last 168 hours.  Sepsis Markers  Recent Labs Lab 11/07/15 0800  LATICACIDVEN 0.7    ABG  Recent Labs Lab 11/05/15 0414 11/07/15 0005 11/07/15 0535  PHART 7.312* 7.346* 7.436  PCO2ART 46.7 49.6* 37.5  PO2ART 34.7* 59.5* 114*    Liver Enzymes No results for input(s): AST, ALT, ALKPHOS, BILITOT, ALBUMIN in the last 168 hours.  Cardiac Enzymes No results for input(s): TROPONINI, PROBNP in the last 168 hours.  Glucose  Recent Labs Lab 11/08/15 1143 11/08/15 1515 11/08/15 1950 11/09/15 0017 11/09/15 0312 11/09/15 0835  GLUCAP 169* 226* 208* 192* 220* 192*    Imaging Dg Chest Port 1 View  Result Date: 11/09/2015 CLINICAL DATA:  Respiratory failure. EXAM: PORTABLE CHEST 1 VIEW COMPARISON:  11/08/2015. FINDINGS: Endotracheal tube and NG tube in stable position. Cardiac pacer noted with leads in the right atrium right ventricle. Heart size stable. Persistent bilateral airspace disease, left side greater than right. Persistent scratched right pleural effusion. Prior  cervical spine fusion. IMPRESSION: 1. Lines and tubes in stable position. 2.  Cardiac pacer stable position.  Heart size stable. 3. Persistent bilateral airspace disease in right pleural effusion. No significant change. Electronically Signed   By: Marcello Moores  Register   On: 11/09/2015 07:59     STUDIES:  CT chest 10/12 >> occlusion of Rt bronchus intermedius, basilar consolidation, small Rt effusion Bronchoscopy 10/13 >> significant purulent secretions airways CT Pelvis 10/16 >> no evidence of emphysematous fasciitis along the perineum, stage IV decubitus ulcers along the sacrum / ischial tuberosities associated with chronic osteomyelitis  CULTURES: Blood  10/11 >> neg Urine 10/11 >> Acinetobacter, yeast Sputum 10/13 >> neg  ANTIBIOTICS: Vancomycin 10/11 >> 10/15 Levaquin 10/11 >> 10/12 Aztreonam 10/11 >> 10/12  Diflucan 10/12 >> 10/14 Primaxin 10/12 >>   SIGNIFICANT EVENTS: 10/11  Admit 10/13  VDRF, pressors, bronch 10/15  Vasopressors off  10/16  Fever, weaning on PSV 10/5 10/17  Extubated, failed and reintubated overnight 10/20  Weaning on PSV, plan for trach 10/23  LINES/TUBES: Lt femoral aline 10/13 >> 10/14 ETT 10/13 >> 10/17, 10/18 >>  Lt IJ CVL 10/13 >>  DISCUSSION: 45 yo female from home with lethargy and respiratory distress.  Found to have infected sacral wounds (present prior to this admission).  She has hx of C2-C7 posterior fusion in September 2015 after traumatic spinal cord injury with paraplegia (fell down stairs while intoxicated).  She developed progressive hypotension and respiratory failure from HCAP and a UTI leading to septic shock.  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, recent steroids as outpatient Hx of SSS s/p PM P:  Monitor blood pressure Continue telemetry monitoring Stress steroids added 10/18, wean 10/20 to Q12 MAP > 60 acceptable   RENAL A:   Lactic acidosis - resolved Neurogenic bladder Hypokalemia P:   Monitor BMET and UOP Replace electrolytes as needed NS @ 50 ml/hr  GASTROINTESTINAL A:   Moderate protein calorie malnutrition Constipation  Ileus ruled out 10/16, emphysematous fasciitis as well by CT P:   TF  per Nutrition Protonix for SUP Senokot-S BID  Will ask IR to review for PEG placement 10/23 after trach  HEMATOLOGIC A:   Leukocytosis Anemia - suspect component of dilution + phlebotomy, no evidence of bleeding  P:  F/u CBC Lovenox for DVT prophylaxis Transfuse per ICU guidelines   INFECTIOUS A:   Fever - 10/16, potential sources include wounds, foley, PNA, central line HCAP Decubitus ulcers - extensive wounds on sacrum, legs, back of head Acinetobacter + Yeast UTI  P:   Continue abx as above Plan for 10 days abx, d7 Wound care  ENDOCRINE A:   Hyperglycemia - iatrogenic, stress steroids P:   Monitor glucose on BMP Sensitive SSI while on stress steroids  NEUROLOGIC A:   C2-C7 traumatic spinal cord injury Acute metabolic encephalopathy Pain  P:   RASS goal: 0  Continue effexor (in place of home cymbalta as unable to crush) Neurontin 300 mg TID Resume home oxycodone PRN PRN fentanyl  for pain  Continue home baclofen at reduced dose, 10 mg TID Likely will need LTAC for rehab efforts if patient willing    Goals of care >> Palliative medicine met with family on 10/14, patient desires full code, tracheostomy if necessary. They plan further conversation considering her multiple comorbid illnesses.   Family:  Mother updated via phone 10/20.  Extensive discussion with her again regarding the nature of Kim's spinal cord injury and how it leads to chronic illness.  Mother expresses that [name] would want to be home with her 71 y/o daughter.  She feels [name] would accept short term vent rehab but the ultimate goal would be to get her home with her family.  Mom understands that this would require lots of support / help.  We discussed that she most likely at minimum will need nocturnal vent.  Mother is hopeful to have her home by Thanksgiving.   Noe Gens, NP-C [facility] Pgr: [phone] or if no answer [phone] 11/09/2015, 9:49 AM   Attending Note:  I  have examined patient, reviewed labs, studies and notes. I have discussed the case with B Ollis, and I agree with the data and plans as amended above. 45 yo woman, hx fall and cervical spine injury with resultant paraplegia, admitted with sepsis and acute resp failure requiring mechanical ventilation in setting UTI and HCAP. She is off pressors, on imipenem for acinetobacter UTI d 8 of 10. She tolerates PSV, but failed extubation 10/17. She will need tracheostomy and probably LTAC. Good discussion with pt's mother today - explained that trach and PEG will be needed for short term progress. She understands this. She is more hesitant committing to long term support of this nature. Her overall goal is to be able to care for her at home. Again not certain that she will progress to that point. Independent critical care time is 35 minutes.   Baltazar Apo, MD, PhD 11/09/2015, 11:00 AM [facility] [phone] or if no answer [phone]

## 2015-11-10 LAB — CBC
HCT: 22.4 % — ABNORMAL LOW (ref 36.0–46.0)
Hemoglobin: 7.4 g/dL — ABNORMAL LOW (ref 12.0–15.0)
MCH: 29.7 pg (ref 26.0–34.0)
MCHC: 33 g/dL (ref 30.0–36.0)
MCV: 90 fL (ref 78.0–100.0)
PLATELETS: 403 10*3/uL — AB (ref 150–400)
RBC: 2.49 MIL/uL — ABNORMAL LOW (ref 3.87–5.11)
RDW: 17.6 % — ABNORMAL HIGH (ref 11.5–15.5)
WBC: 10.8 10*3/uL — AB (ref 4.0–10.5)

## 2015-11-10 LAB — BASIC METABOLIC PANEL
Anion gap: 5 (ref 5–15)
BUN: 21 mg/dL — AB (ref 6–20)
CHLORIDE: 112 mmol/L — AB (ref 101–111)
CO2: 29 mmol/L (ref 22–32)
Calcium: 7.9 mg/dL — ABNORMAL LOW (ref 8.9–10.3)
Creatinine, Ser: <0.3 mg/dL — ABNORMAL LOW (ref 0.44–1.00)
GLUCOSE: 133 mg/dL — AB (ref 65–99)
POTASSIUM: 2.8 mmol/L — AB (ref 3.5–5.1)
Sodium: 146 mmol/L — ABNORMAL HIGH (ref 135–145)

## 2015-11-10 LAB — GLUCOSE, CAPILLARY
GLUCOSE-CAPILLARY: 135 mg/dL — AB (ref 65–99)
GLUCOSE-CAPILLARY: 129 mg/dL — AB (ref 65–99)
GLUCOSE-CAPILLARY: 120 mg/dL — AB (ref 65–99)
GLUCOSE-CAPILLARY: 177 mg/dL — AB (ref 65–99)
Glucose-Capillary: 160 mg/dL — ABNORMAL HIGH (ref 65–99)

## 2015-11-10 MED ORDER — FAMOTIDINE 40 MG/5ML PO SUSR: 20.0000 mg | Freq: Two times a day (BID) | ORAL | Status: DC

## 2015-11-10 MED ORDER — POTASSIUM CHLORIDE 20 MEQ/15ML (10%) PO SOLN
40.0000 meq | ORAL | Status: AC
  Administered 2015-11-10 (×2): 40 meq
  Filled 2015-11-10 (×2): qty 30

## 2015-11-10 MED ORDER — RANITIDINE HCL 150 MG/10ML PO SYRP
150.0000 mg | ORAL_SOLUTION | Freq: Two times a day (BID) | ORAL | Status: DC
  Administered 2015-11-10 – 2015-11-27 (×34): 150 mg
  Filled 2015-11-10 (×36): qty 10

## 2015-11-10 NOTE — Progress Notes (Signed)
PCCM PROGRESS NOTE  Admission date:  10/31/2015 Consult date:  11/01/2015 Referring provider:  Dr. Roel Cluck, [facility]  CC:  Short of breath  Subjective:   Tolerating pressure support 10/8.  Denies chest/abd pain.  Vital signs: BP 111/74   Pulse 80   Temp 97.2 F (36.2 C)   Resp 15   Ht 5\' 2"  (1.575 m)   Wt 143 lb 1.3 oz (64.9 kg)   SpO2 100%   BMI 26.17 kg/m   Intake/output: I/O last 3 completed shifts: In: 3798.5 [I.V.:1673.2; NG/GT:1575.3; IV Piggyback:550] Out: [id number] [Urine:1325]  Vent settings: Vent Mode: Other (Comment) FiO2 (%):  [30 %] 30 % Set Rate:  [20 bmp] 20 bmp Vt Set:  [400 mL] 400 mL PEEP:  [5 cmH20-8 cmH20] 5 cmH20 Pressure Support:  [5 cmH20-10 cmH20] 10 cmH20 Plateau Pressure:  [18 [id number] cmH20] 23 cmH20  General: alert Neuro: follows simple commands, no movement in legs, can shrug shoulders HEENT: ETT in place Cardiac: regular, no murmur Chest: no wheeze Abd: soft, non tender Ext: 1+ edema Skin: pressure sores  LABS: CMP Latest Ref Rng & Units 11/10/2015 11/09/2015 11/08/2015  Glucose 65 - 99 mg/dL 133(H) 219(H) 195(H)  BUN 6 - 20 mg/dL 21(H) 18 18  Creatinine 0.44 - 1.00 mg/dL <0.30(L) <0.30(L) 0.32(L)  Sodium 135 - 145 mmol/L 146(H) 145 145  Potassium 3.5 - 5.1 mmol/L 2.8(L) 3.4(L) 3.0(L)  Chloride 101 - 111 mmol/L 112(H) 113(H) 113(H)  CO2 22 - 32 mmol/L 29 29 26   Calcium 8.9 - 10.3 mg/dL 7.9(L) 7.7(L) 8.3(L)  Total Protein 6.5 - 8.1 g/dL - - -  Total Bilirubin 0.3 - 1.2 mg/dL - - -  Alkaline Phos 38 - 126 U/L - - -  AST 15 - 41 U/L - - -  ALT 14 - 54 U/L - - -    CBC Latest Ref Rng & Units 11/10/2015 11/09/2015 11/08/2015  WBC 4.0 - 10.5 K/uL 10.8(H) 13.4(H) 9.8  Hemoglobin 12.0 - 15.0 g/dL 7.4(L) 7.9(L) 7.5(L)  Hematocrit 36.0 - 46.0 % 22.4(L) 23.8(L) 22.3(L)  Platelets 150 - 400 K/uL 403(H) 383 259    ABG    Component Value Date/Time   PHART 7.436 11/07/2015 0535   PCO2ART 37.5 11/07/2015 0535   PO2ART 114 (H) 11/07/2015  0535   HCO3 25.2 11/07/2015 0535   TCO2 29 10/20/2015 1505   ACIDBASEDEF 2.7 (H) 11/05/2015 0414   O2SAT 98.6 11/07/2015 0535    CBG (last 3)   Recent Labs  11/09/15 2342 11/10/15 0411 11/10/15 0806  GLUCAP 171* 135* 129*    Imaging: Dg Chest Port 1 View  Result Date: 11/09/2015 CLINICAL DATA:  Endotracheal tube placement. EXAM: PORTABLE CHEST 1 VIEW COMPARISON:  11/09/2015 FINDINGS: Endotracheal tube is 3.6 cm above the carina and appropriately positioned. Nasogastric tube extends into the abdomen. Stable position of the left dual-chamber cardiac pacemaker. Again noted are patchy airspace densities in the lower lungs. Evidence for right pleural fluid. Negative for a pneumothorax. There is a left jugular central venous catheter and the tip is near the junction of the left innominate vein and SVC. IMPRESSION: Endotracheal tube is appropriately positioned. Support apparatuses as described. Persistent airspace densities in the lower lungs. Findings could represent edema versus pneumonia. Minimal change from the previous examination. Right pleural effusion. Electronically Signed   By: Markus Daft M.D.   On: 11/09/2015 10:57   Dg Chest Port 1 View  Result Date: 11/09/2015 CLINICAL DATA:  Respiratory failure. EXAM: PORTABLE CHEST 1  VIEW COMPARISON:  11/08/2015. FINDINGS: Endotracheal tube and NG tube in stable position. Cardiac pacer noted with leads in the right atrium right ventricle. Heart size stable. Persistent bilateral airspace disease, left side greater than right. Persistent scratched right pleural effusion. Prior cervical spine fusion. IMPRESSION: 1. Lines and tubes in stable position. 2.  Cardiac pacer stable position.  Heart size stable. 3. Persistent bilateral airspace disease in right pleural effusion. No significant change. Electronically Signed   By: Marcello Moores  Register   On: 11/09/2015 07:59     Studies: CT chest 10/12 >> occlusion of Rt bronchus intermedius, basilar  consolidation, small Rt effusion Bronchoscopy 10/13 >> significant purulent secretions airways CT Pelvis 10/16 >> no evidence of emphysematous fasciitis along the perineum, stage IV decubitus ulcers along the sacrum / ischial tuberosities associated with chronic osteomyelitis  Cultures: Blood 10/11 >> negative Urine 10/11 >> Acinetobacter, yeast Sputum 10/13 >> negative  Antibiotics: Vancomycin 10/11 >> 10/15 Levaquin 10/11 >> 10/12 Aztreonam 10/11 >> 10/12  Diflucan 10/12 >> 10/14 Primaxin 10/12 >>   Events: 10/11  Admit 10/13  VDRF, pressors, bronch 10/15  Vasopressors off  10/16  Fever, weaning on PSV 10/5 10/17  Extubated, failed and reintubated overnight 10/20  Weaning on PSV, plan for trach 10/23  Lines/tubes: Lt femoral aline 10/13 >> 10/14 ETT 10/13 >> 10/17, 10/18 >>  Lt IJ CVL 10/13 >>  Summary: 45 yo female from home with lethargy and respiratory distress.  Found to have infected sacral wounds (present prior to this admission).  She has hx of C2-C7 posterior fusion in September 2015 after traumatic spinal cord injury with paraplegia (fell down stairs while intoxicated).  She developed progressive hypotension and respiratory failure from HCAP and a UTI leading to septic shock.  Failed extubation trial and reintubated.  PMHx SSS s/p PM, Neurogenic bladder  ASSESSMENT / PLAN:  Acute hypoxic/hypercapnic respiratory failure 2nd to sepsis, HCAP. Hx of asthma. Tobacco abuse. - pressure support wean as tolerated - for trach 10/23 - f/u CXR intermittently - scheduled BDs  Autonomic dysfunction with chronic hypotension. - continue midodrine  Relative adrenal insufficiency. - wean off solu cortef as tolerated  Moderate protein calorie malnutrition. Constipation. - continue tube feeds - bowel regimen  Anemia of critical illness and chronic disease. - f/u CBC - check iron levels  Fever 10/16 from UTI with Acinetobacter and Yeast. Decubitus ulcers. - day 8/10  of primaxin - wound care  Steroid induced hyperglycemia. - SSI  C2-C7 spinal cord injury. - antispasticity and pain regimen - continue baclofen, neurotin, effexor  Resolved problems - Septic shock, lactic acidosis  SUP - pepcid DVT prophylaxis - lovenox Goals of care - Palliative care meet with family 10/14.  Full code Disposition - will ask case manager to assess for LTAC after trach  Family:  Mother updated via phone 10/20.  Extensive discussion with her again regarding the nature of Kim's spinal cord injury and how it leads to chronic illness.  Mother expresses that [name] would want to be home with her 45 y/o daughter.  She feels [name] would accept short term vent rehab but the ultimate goal would be to get her home with her family.  Mom understands that this would require lots of support / help.  We discussed that she most likely at minimum will need nocturnal vent.  Mother is hopeful to have her home by Thanksgiving.   Chesley Mires, MD [facility] Pulmonary/Critical Care 11/10/2015, 11:57 AM Pager:  [phone] After 3pm call: [phone]

## 2015-11-10 NOTE — Progress Notes (Signed)
[place] Progress Note Patient Name: Angela Page DOB: [birth year] MRN: [id number]   Date of Service  11/10/2015  HPI/Events of Note  Hypokalemia  eICU Interventions  Potassium replaced     Intervention Category Intermediate Interventions: Electrolyte abnormality - evaluation and management  DETERDING,ELIZABETH 11/10/2015, 5:16 AM

## 2015-11-11 LAB — GLUCOSE, CAPILLARY
GLUCOSE-CAPILLARY: 117 mg/dL — AB (ref 65–99)
GLUCOSE-CAPILLARY: 134 mg/dL — AB (ref 65–99)
GLUCOSE-CAPILLARY: 91 mg/dL (ref 65–99)
Glucose-Capillary: 118 mg/dL — ABNORMAL HIGH (ref 65–99)
Glucose-Capillary: 128 mg/dL — ABNORMAL HIGH (ref 65–99)
Glucose-Capillary: 138 mg/dL — ABNORMAL HIGH (ref 65–99)

## 2015-11-11 MED ORDER — HYDROCORTISONE NA SUCCINATE PF 100 MG IJ SOLR
50.0000 mg | Freq: Every day | INTRAMUSCULAR | Status: DC
  Administered 2015-11-12 – 2015-11-13 (×2): 50 mg via INTRAVENOUS
  Filled 2015-11-11 (×2): qty 2

## 2015-11-11 NOTE — Progress Notes (Signed)
PCCM PROGRESS NOTE  Admission date:  10/31/2015 Consult date:  11/01/2015 Referring provider:  Dr. Roel Cluck, [facility]  CC:  Short of breath  Subjective:   Denies chest/abd pain.  Vital signs: BP (!) 129/103   Pulse 80   Temp (!) 96.8 F (36 C)   Resp 18   Ht 5\' 2"  (1.575 m)   Wt 143 lb 1.3 oz (64.9 kg)   SpO2 99%   BMI 26.17 kg/m   Intake/output: I/O last 3 completed shifts: In: 4031.9 [I.V.:1841.9; NG/GT:1590; IV Piggyback:600] Out: [id number] [Urine:1720]  Vent settings: Vent Mode: PRVC FiO2 (%):  [30 %] 30 % Set Rate:  [20 bmp] 20 bmp Vt Set:  [400 mL] 400 mL PEEP:  [8 cmH20] 8 cmH20 Plateau Pressure:  [12 cmH20-23 cmH20] 22 cmH20  General: alert Neuro: follows simple commands, no movement in legs, can shrug shoulders HEENT: ETT in place Cardiac: regular, no murmur Chest: no wheeze Abd: soft, non tender Ext: 1+ edema Skin: pressure sores   LABS: CMP Latest Ref Rng & Units 11/10/2015 11/09/2015 11/08/2015  Glucose 65 - 99 mg/dL 133(H) 219(H) 195(H)  BUN 6 - 20 mg/dL 21(H) 18 18  Creatinine 0.44 - 1.00 mg/dL <0.30(L) <0.30(L) 0.32(L)  Sodium 135 - 145 mmol/L 146(H) 145 145  Potassium 3.5 - 5.1 mmol/L 2.8(L) 3.4(L) 3.0(L)  Chloride 101 - 111 mmol/L 112(H) 113(H) 113(H)  CO2 22 - 32 mmol/L 29 29 26   Calcium 8.9 - 10.3 mg/dL 7.9(L) 7.7(L) 8.3(L)  Total Protein 6.5 - 8.1 g/dL - - -  Total Bilirubin 0.3 - 1.2 mg/dL - - -  Alkaline Phos 38 - 126 U/L - - -  AST 15 - 41 U/L - - -  ALT 14 - 54 U/L - - -    CBC Latest Ref Rng & Units 11/10/2015 11/09/2015 11/08/2015  WBC 4.0 - 10.5 K/uL 10.8(H) 13.4(H) 9.8  Hemoglobin 12.0 - 15.0 g/dL 7.4(L) 7.9(L) 7.5(L)  Hematocrit 36.0 - 46.0 % 22.4(L) 23.8(L) 22.3(L)  Platelets 150 - 400 K/uL 403(H) 383 259    ABG    Component Value Date/Time   PHART 7.436 11/07/2015 0535   PCO2ART 37.5 11/07/2015 0535   PO2ART 114 (H) 11/07/2015 0535   HCO3 25.2 11/07/2015 0535   TCO2 29 10/20/2015 1505   ACIDBASEDEF 2.7 (H) 11/05/2015  0414   O2SAT 98.6 11/07/2015 0535    CBG (last 3)   Recent Labs  11/11/15 0025 11/11/15 0325 11/11/15 0802  GLUCAP 138* 118* 128*    Imaging: No results found.   Studies: CT chest 10/12 >> occlusion of Rt bronchus intermedius, basilar consolidation, small Rt effusion Bronchoscopy 10/13 >> significant purulent secretions airways CT Pelvis 10/16 >> no evidence of emphysematous fasciitis along the perineum, stage IV decubitus ulcers along the sacrum / ischial tuberosities associated with chronic osteomyelitis  Cultures: Blood 10/11 >> negative Urine 10/11 >> Acinetobacter, yeast Sputum 10/13 >> negative  Antibiotics: Vancomycin 10/11 >> 10/15 Levaquin 10/11 >> 10/12 Aztreonam 10/11 >> 10/12  Diflucan 10/12 >> 10/14 Primaxin 10/12 >>   Events: 10/11  Admit 10/13  VDRF, pressors, bronch 10/15  Vasopressors off  10/16  Fever, weaning on PSV 10/5 10/17  Extubated, failed and reintubated overnight 10/20  Weaning on PSV, plan for trach 10/23  Lines/tubes: Lt femoral aline 10/13 >> 10/14 ETT 10/13 >> 10/17, 10/18 >>  Lt IJ CVL 10/13 >>  Summary: 45 yo female from home with lethargy and respiratory distress.  Found to have infected sacral wounds (  present prior to this admission).  She has hx of C2-C7 posterior fusion in September 2015 after traumatic spinal cord injury with paraplegia (fell down stairs while intoxicated).  She developed progressive hypotension and respiratory failure from HCAP and a UTI leading to septic shock.  Failed extubation trial and reintubated.  PMHx SSS s/p PM, Neurogenic bladder  ASSESSMENT / PLAN:  Acute hypoxic/hypercapnic respiratory failure 2nd to sepsis, HCAP. Hx of asthma. Tobacco abuse. - pressure support wean as tolerated - for trach 10/23 - f/u CXR intermittently - scheduled BDs  Autonomic dysfunction with chronic hypotension. - continue midodrine  Relative adrenal insufficiency. - change solu cortef to 50 mg daily on  10/22  Moderate protein calorie malnutrition. Constipation. - continue tube feeds >> hold after 12 am on 10/23 in anticipation of trach - bowel regimen  Anemia of critical illness and chronic disease. - f/u CBC - check iron levels  Fever 10/16 from UTI with Acinetobacter and Yeast. Decubitus ulcers. - day 9/10 of primaxin - wound care  Steroid induced hyperglycemia. - SSI  C2-C7 spinal cord injury. - antispasticity and pain regimen - continue baclofen, neurotin, effexor  Resolved problems - Septic shock, lactic acidosis  SUP - pepcid DVT prophylaxis - lovenox Goals of care - Palliative care meet with family 10/14.  Full code Disposition - will ask case manager to assess for LTAC after trach  Family:  Mother updated via phone 10/20.  Extensive discussion with her again regarding the nature of Kim's spinal cord injury and how it leads to chronic illness.  Mother expresses that [name] would want to be home with her 45 y/o daughter.  She feels [name] would accept short term vent rehab but the ultimate goal would be to get her home with her family.  Mom understands that this would require lots of support / help.  We discussed that she most likely at minimum will need nocturnal vent.  Mother is hopeful to have her home by Thanksgiving.   Chesley Mires, MD [facility] Pulmonary/Critical Care 11/11/2015, 10:53 AM Pager:  [phone] After 3pm call: [phone]

## 2015-11-12 ENCOUNTER — Inpatient Hospital Stay (HOSPITAL_COMMUNITY): Payer: Medicaid Other

## 2015-11-12 ENCOUNTER — Encounter (HOSPITAL_COMMUNITY): Payer: Self-pay | Admitting: Radiology

## 2015-11-12 ENCOUNTER — Ambulatory Visit (HOSPITAL_COMMUNITY): Payer: Medicaid Other

## 2015-11-12 DIAGNOSIS — R0602 Shortness of breath: Secondary | ICD-10-CM

## 2015-11-12 LAB — CBC
HEMATOCRIT: 22.7 % — AB (ref 36.0–46.0)
HEMOGLOBIN: 7.4 g/dL — AB (ref 12.0–15.0)
MCH: 29.7 pg (ref 26.0–34.0)
MCHC: 32.6 g/dL (ref 30.0–36.0)
MCV: 91.2 fL (ref 78.0–100.0)
Platelets: 446 10*3/uL — ABNORMAL HIGH (ref 150–400)
RBC: 2.49 MIL/uL — AB (ref 3.87–5.11)
RDW: 18.6 % — ABNORMAL HIGH (ref 11.5–15.5)
WBC: 11.4 10*3/uL — AB (ref 4.0–10.5)

## 2015-11-12 LAB — BASIC METABOLIC PANEL
ANION GAP: 5 (ref 5–15)
BUN: 19 mg/dL (ref 6–20)
CALCIUM: 7.9 mg/dL — AB (ref 8.9–10.3)
CO2: 30 mmol/L (ref 22–32)
Chloride: 107 mmol/L (ref 101–111)
Creatinine, Ser: <0.3 mg/dL — ABNORMAL LOW (ref 0.44–1.00)
GLUCOSE: 116 mg/dL — AB (ref 65–99)
POTASSIUM: 3 mmol/L — AB (ref 3.5–5.1)
SODIUM: 142 mmol/L (ref 135–145)

## 2015-11-12 LAB — GLUCOSE, CAPILLARY
GLUCOSE-CAPILLARY: 118 mg/dL — AB (ref 65–99)
GLUCOSE-CAPILLARY: 123 mg/dL — AB (ref 65–99)
GLUCOSE-CAPILLARY: 135 mg/dL — AB (ref 65–99)
GLUCOSE-CAPILLARY: 79 mg/dL (ref 65–99)
Glucose-Capillary: 139 mg/dL — ABNORMAL HIGH (ref 65–99)
Glucose-Capillary: 110 mg/dL — ABNORMAL HIGH (ref 65–99)
Glucose-Capillary: 92 mg/dL (ref 65–99)

## 2015-11-12 LAB — FERRITIN: FERRITIN: 265 ng/mL (ref 11–307)

## 2015-11-12 LAB — IRON AND TIBC
IRON: 34 ug/dL (ref 28–170)
Saturation Ratios: 26 % (ref 10.4–31.8)
TIBC: 132 ug/dL — AB (ref 250–450)
UIBC: 98 ug/dL

## 2015-11-12 LAB — PROTIME-INR
INR: 1.04
Prothrombin Time: 13.6 seconds (ref 11.4–15.2)

## 2015-11-12 LAB — MAGNESIUM: MAGNESIUM: 1.8 mg/dL (ref 1.7–2.4)

## 2015-11-12 MED ORDER — FENTANYL CITRATE (PF) 100 MCG/2ML IJ SOLN
25.0000 ug | INTRAMUSCULAR | Status: DC | PRN
  Administered 2015-11-12 – 2015-11-13 (×7): 50 ug via INTRAVENOUS
  Filled 2015-11-12 (×2): qty 2

## 2015-11-12 MED ORDER — MAGNESIUM SULFATE 2 GM/50ML IV SOLN
2.0000 g | Freq: Once | INTRAVENOUS | Status: AC
  Administered 2015-11-12: 2 g via INTRAVENOUS
  Filled 2015-11-12: qty 50

## 2015-11-12 MED ORDER — VITAL HIGH PROTEIN PO LIQD
1000.0000 mL | ORAL | Status: DC
  Filled 2015-11-12 (×2): qty 1000

## 2015-11-12 MED ORDER — POTASSIUM CHLORIDE 20 MEQ/15ML (10%) PO SOLN
80.0000 meq | Freq: Once | ORAL | Status: AC
  Administered 2015-11-12: 80 meq
  Filled 2015-11-12: qty 60

## 2015-11-12 MED ORDER — NOREPINEPHRINE BITARTRATE 1 MG/ML IV SOLN
0.0000 ug/min | INTRAVENOUS | Status: DC
  Administered 2015-11-12: 2 ug/min via INTRAVENOUS
  Filled 2015-11-12 (×2): qty 4

## 2015-11-12 NOTE — Progress Notes (Signed)
[place] Progress Note Patient Name: Zanari Eisenstein DOB: 1970-01-24 MRN: [id number]   Date of Service  11/12/2015  HPI/Events of Note  Ileus - No BS and abdomen distended.   eICU Interventions  Will order NGT to LIS.      Intervention Category Intermediate Interventions: Other:  Channell Quattrone Cornelia Copa 11/12/2015, 5:10 PM

## 2015-11-12 NOTE — Procedures (Signed)
Name:  [name] MRN:  [id number] DOB:  06/15/1970  OPERATIVE NOTE  Procedure:  Percutaneous tracheostomy.  Indications:  Ventilator-dependent respiratory failure.  Consent:  Procedure, alternatives, risks and benefits discussed with medical POA.  Questions answered.  Consent obtained.  Anesthesia:  Versed, fent , prop  Procedure summary:  Appropriate equipment was assembled.  The patient was identified as Angela Page and safety timeout was performed. The patient was placed in supine position with a towel roll behind shoulder blades and neck extended.  Sterile technique was used. The patient's neck and upper chest were prepped using chlorhexidine / alcohol scrub and the field was draped in usual sterile fashion with full body drape. After the adequate sedation / anesthesia was achieved, attention was directed at the midline trachea, where the cricothyroid membrane was palpated. Approximately two fingerbreadths above the sternal notch, a horizontal incision was created with a scalpel after local infiltration with 0.2% Lidocaine directly over prior trach scar. Then, using Seldinger technique and a percutaneous tracheostomy set, the trachea was entered with a 14 gauge needle with an overlying sheath. This was all confirmed under direct visualization of a fiberoptic flexible bronchoscope. Entrance into the trachea was identified through the third tracheal ring interspace. Following this, a guidewire was inserted. The needle was removed, leaving the sheath and the guidewire intact. Next, the sheath was removed and a small dilator was inserted. The tracheal rings were then dilated. A #6 Shiley was then opened. The balloon was checked. It was placed over a tracheal dilator, which was then advanced over the guidewire and through the previously dilated tract. The Shiley tracheostomy tube was noted to pass in the trachea with little resistance. The guidewire and dilator tubes were removed from the  trachea. An inner cannula was placed through the tracheostomy tube. The tracheostomy was then secured at the anterior neck with 4 monofilament sutures. The oral endotracheal tube was removed and the ventilator was attached to the newly placed tracheostomy tube. Adequate tidal volumes were noted. The cuff was inflated and no evidence of air leak was noted. No evidence of bleeding was noted. At this point, the procedure was concluded. Post-procedure chest x-ray was ordered.  Complications:  No immediate complications were noted.  Hemodynamic parameters and oxygenation remained stable throughout the procedure.  Estimated blood loss:  Less then 1 mL.  [name]., MD Pulmonary and [place] Pager: [phone]  11/12/2015, 12:21 PM   Should follow up in trach clinic uncle petes [phone]

## 2015-11-12 NOTE — Progress Notes (Signed)
Date:  November 12, 2015 Chart reviewed for concurrent status and case management needs. Will continue to follow the patient for status change: full vent support Discharge Planning: following for needs Expected discharge date: [id number] Rhonda Davis, BSN, [place], [place]

## 2015-11-12 NOTE — Progress Notes (Signed)
PCCM PROGRESS NOTE  Admission date:  10/31/2015 Consult date:  11/01/2015 Referring provider:  Dr. Roel Cluck, [facility]  CC:  Short of breath  Subjective:  No acute events overnight.  Pt reports ongoing pain.  K replaced by Warren Lacy.  Afebrile.  Planned trach at noon.  Vital signs: BP 100/71   Pulse 80   Temp (!) 96.3 F (35.7 C)   Resp 16   Ht 5\' 2"  (1.575 m)   Wt 143 lb 11.8 oz (65.2 kg)   SpO2 97%   BMI 26.29 kg/m   Intake/output: I/O last 3 completed shifts: In: 4250.5 [I.V.:2275.5; NG/GT:1575; IV Piggyback:400] Out: 2975 [Urine:2975]  Vent settings: Vent Mode: PRVC FiO2 (%):  [30 %] 30 % Set Rate:  [20 bmp] 20 bmp Vt Set:  [400 mL] 400 mL PEEP:  [8 cmH20] 8 cmH20 Plateau Pressure:  [22 [id number] cmH20] 24 cmH20  General: chronically ill appearing female, alert  Neuro: alert, follows simple commands, no movement in legs, can shrug shoulders HEENT: ETT in place Cardiac: regular, no murmur Chest: no wheeze Abd: soft, non tender Ext: 1+ edema Skin: pressure sores   LABS: CMP Latest Ref Rng & Units 11/12/2015 11/10/2015 11/09/2015  Glucose 65 - 99 mg/dL 116(H) 133(H) 219(H)  BUN 6 - 20 mg/dL 19 21(H) 18  Creatinine 0.44 - 1.00 mg/dL <0.30(L) <0.30(L) <0.30(L)  Sodium 135 - 145 mmol/L 142 146(H) 145  Potassium 3.5 - 5.1 mmol/L 3.0(L) 2.8(L) 3.4(L)  Chloride 101 - 111 mmol/L 107 112(H) 113(H)  CO2 22 - 32 mmol/L 30 29 29   Calcium 8.9 - 10.3 mg/dL 7.9(L) 7.9(L) 7.7(L)  Total Protein 6.5 - 8.1 g/dL - - -  Total Bilirubin 0.3 - 1.2 mg/dL - - -  Alkaline Phos 38 - 126 U/L - - -  AST 15 - 41 U/L - - -  ALT 14 - 54 U/L - - -    CBC Latest Ref Rng & Units 11/12/2015 11/10/2015 11/09/2015  WBC 4.0 - 10.5 K/uL 11.4(H) 10.8(H) 13.4(H)  Hemoglobin 12.0 - 15.0 g/dL 7.4(L) 7.4(L) 7.9(L)  Hematocrit 36.0 - 46.0 % 22.7(L) 22.4(L) 23.8(L)  Platelets 150 - 400 K/uL 446(H) 403(H) 383    ABG    Component Value Date/Time   PHART 7.436 11/07/2015 0535   PCO2ART 37.5 11/07/2015  0535   PO2ART 114 (H) 11/07/2015 0535   HCO3 25.2 11/07/2015 0535   TCO2 29 10/20/2015 1505   ACIDBASEDEF 2.7 (H) 11/05/2015 0414   O2SAT 98.6 11/07/2015 0535    CBG (last 3)   Recent Labs  11/11/15 2356 11/12/15 0347 11/12/15 0746  GLUCAP 91 110* 118*    Imaging: No results found.   Studies: CT chest 10/12 >> occlusion of Rt bronchus intermedius, basilar consolidation, small Rt effusion Bronchoscopy 10/13 >> significant purulent secretions airways CT Pelvis 10/16 >> no evidence of emphysematous fasciitis along the perineum, stage IV decubitus ulcers along the sacrum / ischial tuberosities associated with chronic osteomyelitis  Cultures: Blood 10/11 >> negative Urine 10/11 >> Acinetobacter, yeast Sputum 10/13 >> negative  Antibiotics: Vancomycin 10/11 >> 10/15 Levaquin 10/11 >> 10/12 Aztreonam 10/11 >> 10/12  Diflucan 10/12 >> 10/14 Primaxin 10/12 >> 10/22  Events: 10/11  Admit 10/13  VDRF, pressors, bronch 10/15  Vasopressors off  10/16  Fever, weaning on PSV 10/5 10/17  Extubated, failed and reintubated overnight 10/20  Weaning on PSV, plan for trach 10/23  Lines/tubes: Lt femoral aline 10/13 >> 10/14 ETT 10/13 >> 10/17, 10/18 >>  Lt IJ  CVL 10/13 >>  Summary: 45 yo female from home with lethargy and respiratory distress.  Found to have infected sacral wounds (present prior to this admission).  She has hx of C2-C7 posterior fusion in September 2015 after traumatic spinal cord injury with paraplegia (fell down stairs while intoxicated).  She developed progressive hypotension and respiratory failure from HCAP and a UTI leading to septic shock.  Failed extubation trial and reintubated.  PMHx SSS s/p PM, Neurogenic bladder  ASSESSMENT / PLAN:  Acute hypoxic/hypercapnic respiratory failure 2nd to sepsis, HCAP. Hx of asthma. Tobacco abuse. - pressure support wean as tolerated - planned trach 10/23 - f/u CXR intermittently - scheduled BDs - d/c  MIVF's  Autonomic dysfunction with chronic hypotension. - continue midodrine  Relative adrenal insufficiency. - change solu cortef to 50 mg daily on 10/22 - plan for d/c am 10/24  Moderate protein calorie malnutrition. Constipation. - continue tube feeds >> hold after 12 am on 10/23 in anticipation of trach - bowel regimen  Anemia of critical illness and chronic disease. - f/u CBC - check iron levels  Fever - 10/16 from UTI with Acinetobacter and Yeast. Decubitus ulcers. - completed 10 days primaxin - wound care  Steroid induced hyperglycemia. - SSI  C2-C7 spinal cord injury. - antispasticity and pain regimen - continue baclofen, neurotin, effexor  Resolved problems - Septic shock, lactic acidosis  SUP - pepcid DVT prophylaxis - lovenox Goals of care - Palliative care meet with family 10/14.  Full code Disposition - will ask case manager to assess for LTAC after trach  Family:  Mother updated via phone 10/20.  Extensive discussion with her again regarding the nature of Kim's spinal cord injury and how it leads to chronic illness.  Mother expresses that [name] would want to be home with her 5 y/o daughter.  She feels [name] would accept short term vent rehab but the ultimate goal would be to get her home with her family.  Mom understands that this would require lots of support / help.  We discussed that she most likely at minimum will need nocturnal vent.  Mother is hopeful to have her home by Thanksgiving.    Noe Gens, NP-C [place] [facility] Pgr: [phone] or if no answer [phone] 11/12/2015, 9:36 AM

## 2015-11-12 NOTE — Procedures (Signed)
Bedside Tracheostomy Insertion Procedure Note   Patient Details:   Name: Angela Page DOB: 1970-09-06 MRN: [id number]  Procedure: Tracheostomy  Pre Procedure Assessment: ET Tube Size: 7.5 ET Tube secured at lip (cm): 24 Bite block in place: No Breath Sounds: Clear  Post Procedure Assessment: BP (!) 84/62   Pulse 80   Temp (!) 96.6 F (35.9 C)   Resp (!) 24   Ht 5\' 2"  (1.575 m)   Wt 143 lb 11.8 oz (65.2 kg)   SpO2 97%   BMI 26.29 kg/m  O2 sats: stable throughout Complications: No apparent complications Patient did tolerate procedure well Tracheostomy Brand:Shiley Tracheostomy Style:Cuffed Tracheostomy Size: 6 Tracheostomy Secured [id number], velcro Tracheostomy Placement Confirmation:Trach cuff visualized and in place and Chest X ray ordered for placement    Kathie Dike 11/12/2015, 12:37 PM

## 2015-11-12 NOTE — Progress Notes (Signed)
Pt transported to CT and back to 1229 without incident.  Pt remained stable throughout trip.

## 2015-11-12 NOTE — Progress Notes (Addendum)
Decreased FIO2 to 50% due to stable sats. #6 Shiley Cuffed Trach in place. Sutures in place. Neb tx done

## 2015-11-12 NOTE — Progress Notes (Signed)
Decreased Peep 5 and FIO2 to 40% due to stable sats

## 2015-11-12 NOTE — Procedures (Signed)
PCCM Video Bronchoscopy Procedure Note  The patient was informed of the risks (including but not limited to bleeding, infection, respiratory failure, lung injury, tooth/oral injury) and benefits of the procedure and gave consent, see chart.  Indication: Facilitate percutaneous tracheostomy   Post Procedure Diagnosis: same  Location: [facility] room 1229 (ICU)  Condition pre procedure: critically ill, on vent  Medications for procedure: propofol infusion, etomidate, fentanyl  Procedure description: The bronchoscope was introduced through the endotracheal tube and passed to the bilateral lungs to the level of the subsegmental bronchi throughout the tracheobronchial tree.  Airway exam revealed thick mucus near the endotracheal tube orifice.  Trachea normal otherwise.  The scope was used to facilitate the tracheostomy.  Procedures performed: None  Specimens sent: none  Condition post procedure: critically ill on vent  EBL: none from bronchsocopy  Complications: none immediate  Roselie Awkward, MD [place] PCCM Pager: [phone] Cell: [phone] After 3pm or if no response, call [phone]

## 2015-11-12 NOTE — Progress Notes (Signed)
[facility] [facility] Progress Note Patient Name: Angela Page DOB: 04/23/1970 MRN: [id number]   Date of Service  11/12/2015  HPI/Events of Note  K 3.0  eICU Interventions  Repleted     Intervention Category Evaluation Type: Other  [name] 11/12/2015, 6:30 AM

## 2015-11-12 NOTE — Progress Notes (Signed)
Nutrition Follow-up  DOCUMENTATION CODES:   Non-severe (moderate) malnutrition in context of acute illness/injury, Underweight  INTERVENTION:  - Will change TF order for when able to re-start: Vital High Protein @ 55 mL/hr to provide 1320 kcal, 116 grams of protein (97% estimated protein need), and 1103 mL free water.  - RD will follow-up 10/24  NUTRITION DIAGNOSIS:   Inadequate oral intake related to inability to eat as evidenced by NPO status. -ongoing  GOAL:   Patient will meet greater than or equal to 90% of their needs -previously met with TF regimen.  MONITOR:   Vent status, Weight trends, Labs, Skin, I & O's, Other (Comment) (Re-start of TF after trach)  ASSESSMENT:   45 y.o. 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 Presented with worsening confusion status decreased by mouth intake foul-smelling decubitus ulcer. Apparently has been more sedated than usual at home; family did endorse that patient may have possibly received more narcotics than usual. Family also states that she has had decreased by mouth intake. Mother was very concerned that patient developed lung cancer based on the fact that her father died of it and want patient to be tested extensively. In ED patient required Narcan 2 and responded: suggestive of opioid overdose contributing to oversedation  10/23 Pt continues with OGT. TF turned to off at midnight in anticipation of trach today. RN confirmed this. Estimated nutrition needs updated this AM and based on weight from 10/20 (61.6) as weight is +3.6 kg since that time. TF order following trach and re-start of TF outlined above.  Patient is currently intubated on ventilator support MV: 7.9 L/min Temp  (24hrs), Avg:96.9 F (36.1 C), Min:96.3 F (35.7 C), Max:97.8 F (36.6 C) BP: 101/72 and MAP: 81   Medications reviewed; 100 mg Colace BID, 50 mg Solu-Cortef/day, sliding scale Novolog, 2 g IV Mg sulfate x1 dose today, PRN IV Zofran, 80 mEq KCL per OGT x1 dose today, 5 mL Senokot BID. Labs reviewed; CBGs: 110 and 118 mg/dL this AM, K: 3 mmol/L, creatinine: 0.3 mg/dL, Ca: 7.9 mg/dL.  IVF: NS @ 50 mL/hr. Drip: Precedex @ 1 mcg/kg/hr.     10/20 - Pt continues with OGT.  - She denies abdominal pain this AM but states she is having some nausea. - She continuously points to LLQ but unable to understand what pt is trying to communicate concerning this. Informed RN of pt's discomfort. - Per chart review, weight +1.7 kg since yesterday.  - Weight from yesterday (59.9 kg) used in re-estimating kcal need.  - Pt currently receiving Vital High Protein @ 60 mL/hr with 30 mL free water every 4 hours. - This regimen is providing 1440 kcal, 126 grams of protein, and 1384 mL free water.   Patient is currently intubated on ventilator support MV: 8.1 L/min Temp (24hrs), Avg:98.7 F (37.1 C), Min:98.1 F (36.7 C), Max:99.1 F (37.3 C) IVF: NS @ 50 mL/hr.  Drip: Precedex @ 0.4 mcg/kg/hr.     10/19 - Pt with OGT in place.  - She indicates that she is having nausea and abdominal pain at time of RD visit; RN was alerted and provided pt with PRN antinausea medication.  - Pt currently receiving Vital High Protein @ 50 mL/hr with 30 mL free water every 4 hours. - This regimen is providing  1200 kcal, 105 grams of protein, and 1183 mL free water. - Estimated nutrition needs updated and based on CBW of 59.9 kg as weight has been mainly stable since 10/15.  - Per CCM note, MAP >60 acceptable for this pt. Note also indicates plan for PEG evaluation once trach is planned.  Patient is currently intubated on ventilator support MV: 8.6L/min Temp (24hrs), Avg:97.7 F (36.5 C), Min:96.8 F (36 C),  Max:98.8 F (37.1 C) IVF: NS @ 50 mL/hr. Drip:Precedex @ 0.9 mcg/kg/hr.     Diet Order:  Diet NPO time specified  Skin: Stage 4 pressure injuries to coccyx and bilateral buttocks, Stage 1 pressure injuries to mid, upper, bilateral back and L shoulder, Unstageable wound to cervical area.     Last BM:  10/22  Height:   Ht Readings from Last 1 Encounters:  11/07/15 5' 2" (1.575 m)    Weight:   Wt Readings from Last 1 Encounters:  11/12/15 143 lb 11.8 oz (65.2 kg)    Ideal Body Weight:  47.5 kg  BMI:  Body mass index is 26.29 kg/m.  Estimated Nutritional Needs:   Kcal:  1314  Protein:  120 grams (2 grams/kg)  Fluid:  >/= 1.6 L/day  EDUCATION NEEDS:   No education needs identified at this time    Jarome Matin, MS, RD, LDN Inpatient Clinical Dietitian Pager # [phone] After hours/weekend pager # [phone]

## 2015-11-13 ENCOUNTER — Inpatient Hospital Stay (HOSPITAL_COMMUNITY): Payer: Medicaid Other

## 2015-11-13 DIAGNOSIS — R0602 Shortness of breath: Secondary | ICD-10-CM

## 2015-11-13 LAB — GLUCOSE, CAPILLARY
GLUCOSE-CAPILLARY: 85 mg/dL (ref 65–99)
GLUCOSE-CAPILLARY: 99 mg/dL (ref 65–99)
GLUCOSE-CAPILLARY: 79 mg/dL (ref 65–99)
GLUCOSE-CAPILLARY: 77 mg/dL (ref 65–99)
Glucose-Capillary: 88 mg/dL (ref 65–99)
Glucose-Capillary: 91 mg/dL (ref 65–99)
Glucose-Capillary: 69 mg/dL (ref 65–99)

## 2015-11-13 LAB — CBC WITH DIFFERENTIAL/PLATELET
BASOS ABS: 0 10*3/uL (ref 0.0–0.1)
Basophils Relative: 0 %
Eosinophils Absolute: 0.3 10*3/uL (ref 0.0–0.7)
Eosinophils Relative: 3 %
HEMATOCRIT: 22.2 % — AB (ref 36.0–46.0)
Hemoglobin: 7.3 g/dL — ABNORMAL LOW (ref 12.0–15.0)
LYMPHS PCT: 12 %
Lymphs Abs: 1.1 10*3/uL (ref 0.7–4.0)
MCH: 29.8 pg (ref 26.0–34.0)
MCHC: 32.9 g/dL (ref 30.0–36.0)
MCV: 90.6 fL (ref 78.0–100.0)
Monocytes Absolute: 0.4 10*3/uL (ref 0.1–1.0)
Monocytes Relative: 4 %
NEUTROS ABS: 8.1 10*3/uL — AB (ref 1.7–7.7)
Neutrophils Relative %: 81 %
PLATELETS: 476 10*3/uL — AB (ref 150–400)
RBC: 2.45 MIL/uL — AB (ref 3.87–5.11)
RDW: 19.2 % — ABNORMAL HIGH (ref 11.5–15.5)
WBC: 9.8 10*3/uL (ref 4.0–10.5)

## 2015-11-13 LAB — BASIC METABOLIC PANEL
ANION GAP: 5 (ref 5–15)
BUN: 14 mg/dL (ref 6–20)
CHLORIDE: 104 mmol/L (ref 101–111)
CO2: 31 mmol/L (ref 22–32)
Calcium: 8 mg/dL — ABNORMAL LOW (ref 8.9–10.3)
Creatinine, Ser: <0.3 mg/dL — ABNORMAL LOW (ref 0.44–1.00)
Glucose, Bld: 80 mg/dL (ref 65–99)
Potassium: 3.5 mmol/L (ref 3.5–5.1)
Sodium: 140 mmol/L (ref 135–145)

## 2015-11-13 LAB — CBC
HEMATOCRIT: 21.2 % — AB (ref 36.0–46.0)
HEMOGLOBIN: 7 g/dL — AB (ref 12.0–15.0)
MCH: 30 pg (ref 26.0–34.0)
MCHC: 33 g/dL (ref 30.0–36.0)
MCV: 91 fL (ref 78.0–100.0)
Platelets: 452 10*3/uL — ABNORMAL HIGH (ref 150–400)
RBC: 2.33 MIL/uL — AB (ref 3.87–5.11)
RDW: 19.3 % — ABNORMAL HIGH (ref 11.5–15.5)
WBC: 9.5 10*3/uL (ref 4.0–10.5)

## 2015-11-13 MED ORDER — BISACODYL 10 MG RE SUPP: 10.0000 mg | Freq: Every day | RECTAL | Status: DC | PRN

## 2015-11-13 MED ORDER — VITAL HIGH PROTEIN PO LIQD
1000.0000 mL | ORAL | Status: DC
  Administered 2015-11-14: 1000 mL
  Filled 2015-11-13: qty 1000

## 2015-11-13 MED ORDER — DEXTROSE 50 % IV SOLN
25.0000 mL | Freq: Once | INTRAVENOUS | Status: AC
  Administered 2015-11-13: 25 mL via INTRAVENOUS

## 2015-11-13 MED ORDER — ZOLPIDEM TARTRATE 5 MG PO TABS
5.0000 mg | ORAL_TABLET | Freq: Every day | ORAL | Status: DC
  Administered 2015-11-13 – 2015-11-26 (×14): 5 mg via ORAL
  Filled 2015-11-13 (×14): qty 1

## 2015-11-13 MED ORDER — DEXTROSE 50 % IV SOLN
INTRAVENOUS | Status: AC
  Filled 2015-11-13: qty 50

## 2015-11-13 NOTE — Progress Notes (Signed)
Nutrition Follow-up  DOCUMENTATION CODES:   Non-severe (moderate) malnutrition in context of acute illness/injury, Underweight  INTERVENTION:  - Begin Vital High Protein @ 15 mL/hr on 10/25. - RD will follow-up 10/25.  NUTRITION DIAGNOSIS:   Inadequate oral intake related to inability to eat as evidenced by NPO status. -ongoing  GOAL:   Patient will meet greater than or equal to 90% of their needs -unable to meet at this time.  MONITOR:   TF tolerance, Weight trends, Labs, Skin, I & O's  ASSESSMENT:   45 y.o. 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 Presented with worsening confusion status decreased by mouth intake foul-smelling decubitus ulcer. Apparently has been more sedated than usual at home; family did endorse that patient may have possibly received more narcotics than usual. Family also states that she has had decreased by mouth intake. Mother was very concerned that patient developed lung cancer based on the fact that her father died of it and want patient to be tested extensively. In ED patient required Narcan 2 and responded: suggestive of opioid overdose contributing to oversedation  10/24 Trach procedure performed shortly before 1230 yesterday. NGT now in place. Note from yesterday at 61 reviewed concerning ileus with order for NGT to LIS. Per Dr. Anastasia Pall note this AM, plan for trach collar trial today, plan for Dulcolax today with continued Colace and okay to start trickle TF tomorrow. Also per note, plan for PEG consult tomorrow. Reviewed CT abdomen results from yesterday which indicated worsening anasarca and moderate increasing ascites.   Patient is currently intubated on ventilator support MV: 5.1  L/min Temp (24hrs), Avg:96.6 F (35.9 C), Min:95.9 F (35.5 C), Max:97 F (36.1 C)  Medications reviewed; PRN Dulcolax, 100 mg Colace BID, sliding scale Novolog, 2 g IV Mg sulfate x1 dose yesterday, PRN IV Zofran, 5 mL Senokot BID.  Labs reviewed; CBGs: 85 and 88 mg/dL this AM, creatinine: <0.3 mg/dL, Ca: 8 mg/dL.  Drip: Precedex @ 0.4 mcg/kg/hr.     10/23 - Pt continues with OGT.  - TF turned to off at midnight in anticipation of trach today.  - Estimated nutrition needs updated this AM and based on weight from 10/20 (61.6) as weight is +3.6 kg since that time.  Patient is currently intubated on ventilator support MV: 7.9 L/min Temp (24hrs), Avg:96.9 F (36.1 C), Min:96.3 F (35.7 C), Max:97.8 F (36.6 C) BP: 101/72 and MAP: 81 IVF: NS @ 50 mL/hr. Drip: Precedex @ 1 mcg/kg/hr.     10/20 - Pt continues with OGT.  - She denies abdominal pain this AM but states she is having some nausea. - She continuously points to LLQ but unable to understand what pt is trying to communicate concerning this. Informed RN of pt's discomfort. - Per chart review, weight +1.7 kg since yesterday.  - Weight from yesterday (59.9 kg) used in re-estimating kcal need.  - Pt currently receiving Vital High Protein @ 60 mL/hr with 30 mL free water every 4 hours. - This regimen is providing 1440 kcal, 126 grams of protein, and 1384 mL free water.   Patient is currently intubated on ventilator support MV: 8.1L/min Temp (24hrs), Avg:98.7 F (37.1 C), Min:98.1 F (36.7 C), Max:99.1 F (37.3 C) IVF:NS @ 50 mL/hr.  Drip: Precedex @ 0.4 mcg/kg/hr.    Diet Order:  Diet NPO time specified  Skin: Stage 4 pressure injuries to coccyx and bilateral buttocks, Stage 1 pressure injuries to mid, upper, bilateral back and L shoulder, Unstageable wound to cervical area.     Last BM:  10/23  Height:   Ht Readings from Last 1 Encounters:  11/07/15 5\' 2"  (1.575 m)    Weight:   Wt Readings from Last 1  Encounters:  11/12/15 143 lb 11.8 oz (65.2 kg)    Ideal Body Weight:  47.5 kg  BMI:  Body mass index is 26.29 kg/m.  Estimated Nutritional Needs:   Kcal:  1314  Protein:  120 grams (2 grams/kg)  Fluid:  >/= 1.6 L/day  EDUCATION NEEDS:   No education needs identified at this time    Jarome Matin, MS, RD, LDN Inpatient Clinical Dietitian Pager # [phone] After hours/weekend pager # [phone]

## 2015-11-13 NOTE — Progress Notes (Signed)
[facility] Progress Note Patient Name: Angela Page DOB: [birth year] MRN: [id number]   Date of Service  11/13/2015  HPI/Events of Note  Picc team can not place picc line due to inadequate veins. Recommended tunneled catheter.   eICU Interventions  Will defer to day rounder re: Consult for tunneled catheter.         Laverle Hobby 11/13/2015, 4:32 PM

## 2015-11-13 NOTE — Progress Notes (Signed)
IR aware of request for G-tube. Recent trach yesterday. Has NGT Chart reviewed. CT abdomen yesterday shows tenting of stomach up to abdominal wall suggesting prior G-tube placement. Also shows significant ileus with dilated bowel surrounding stomach. Discussed with Dr. Kathlene Cote, feel Ileus/bowel dilatation needs to improve before IR can safely move fwd with perc G-tube. Will follow status and plan for G-tube accordingly.  Ascencion Dike PA-C Interventional Radiology 11/13/2015 11:59 AM

## 2015-11-13 NOTE — Progress Notes (Signed)
Patient placed on ATC a 35%. Patient tolerating well. VSS; RT will continue to monitor patient.

## 2015-11-13 NOTE — Progress Notes (Signed)
PULMONARY / CRITICAL CARE MEDICINE   Name: Angela Page MRN: [id number] DOB: Aug 29, 1970    ADMISSION DATE:  10/31/2015 CONSULTATION DATE:  11/01/2015  REFERRING MD:  Dr. Roel Cluck, [facility]  CHIEF COMPLAINT:  Short of breath  SUBJECTIVE:  Tracheostomy yesterday, has evidence of ileus on CT  VITAL SIGNS: BP 95/69 (BP Location: Left Arm)   Pulse 80   Temp (!) 95.9 F (35.5 C)   Resp (!) 22   Ht _0  (1.575 m)   Wt 143 lb 11.8 oz (65.2 kg)   SpO2 100%   BMI 26.29 kg/m   HEMODYNAMICS:    VENTILATOR SETTINGS: Vent Mode: PRVC FiO2 (%):  [30 %-100 %] 35 % Set Rate:  [20 bmp] 20 bmp Vt Set:  [400 mL] 400 mL PEEP:  [5 cmH20-8 cmH20] 5 cmH20 Plateau Pressure:  [19 cmH20-23 cmH20] 20 cmH20  INTAKE / OUTPUT: I/O last 3 completed shifts: In: 1928 [I.V.:1293; NG/GT:585; IV Piggyback:50] Out: 2200 [Urine:2000; Emesis/NG output:200]  PHYSICAL EXAMINATION: General:  Chronically ill appearing female, on vent HENT: Tracheostomy site clean, dry, no bleeding PULM: CTA B, vent supported breaths CV: RRR, no mgr GI: mild distension Derm: anasarca noted, thin skin Neuro: Awake, alert, following commands  LABS:  BMET  Recent Labs Lab 11/10/15 0400 11/12/15 0510 11/13/15 0525  NA 146* 142 140  K 2.8* 3.0* 3.5  CL 112* 107 104  CO2 _1 BUN 21* 19 14  CREATININE <0.30* <0.30* <0.30*  GLUCOSE 133* 116* 80    Electrolytes  Recent Labs Lab 11/08/15 0530 11/09/15 0621 11/10/15 0400 11/12/15 0510 11/13/15 0525  CALCIUM 8.3* 7.7* 7.9* 7.9* 8.0*  MG 1.8 2.0  --  1.8  --     CBC  Recent Labs Lab 11/10/15 0400 11/12/15 0510 11/13/15 0550  WBC 10.8* 11.4* 9.5  HGB 7.4* 7.4* 7.0*  HCT 22.4* 22.7* 21.2*  PLT 403* 446* 452*    Coag's  Recent Labs Lab 11/12/15 0510  INR 1.04    Sepsis Markers  Recent Labs Lab 11/07/15 0800  LATICACIDVEN 0.7    ABG  Recent Labs Lab 11/07/15 0005 11/07/15 0535  PHART 7.346* 7.436  PCO2ART 49.6* 37.5   PO2ART 59.5* 114*    Liver Enzymes No results for input(s): AST, ALT, ALKPHOS, BILITOT, ALBUMIN in the last 168 hours.  Cardiac Enzymes No results for input(s): TROPONINI, PROBNP in the last 168 hours.  Glucose  Recent Labs Lab 11/12/15 1125 11/12/15 1606 11/12/15 1934 11/12/15 2336 11/13/15 0550 11/13/15 0751  GLUCAP 123* 135* 92 79 88 85    Imaging Ct Abdomen Pelvis Wo Contrast  Result Date: 11/13/2015 CLINICAL DATA:  Abdominal distention and tenderness. History of ileus and appendectomy, quadriparesis. Assess anatomy for gastrostomy tube placement. EXAM: CT ABDOMEN AND PELVIS WITHOUT CONTRAST TECHNIQUE: Multidetector CT imaging of the abdomen and pelvis was performed following the standard protocol without IV contrast. COMPARISON:  CT pelvis November 05, 2015 FINDINGS: LOWER CHEST: Dense consolidation in the included lower lobes bilaterally. Bilateral pleural effusions. Heart size is normal. No pericardial effusion. Cardiac pacer wires in place. HEPATOBILIARY: Normal. PANCREAS: Normal. SPLEEN: Normal. ADRENALS/URINARY TRACT: Kidneys are orthotopic, demonstrating normal size and morphology. 6 mm calculus in RIGHT renal pelvis, 6 mm calculus in proximal RIGHT ureter. 3 mm and 2 mm LEFT lower pole nephrolithiasis. No hydronephrosis. Limited assessment for renal masses on this nonenhanced examination. The unopacified ureters are normal in course and caliber. Urinary bladder is partially distended containing a Foley catheter in bulb.  Multiple small dependent bladder calculi. Normal adrenal glands. STOMACH/BOWEL: Mild gas and contrast distended colon to 5.7 cm. Decompressed small bowel. Gastrostomy tube terminates in mid stomach. Tenting of the greater car bridge the stomach toward the abdominal wall most compatible with old gastrostomy tube. VASCULAR/LYMPHATIC: Aortoiliac vessels are normal in course and caliber, severe calcific atherosclerosis. REPRODUCTIVE: Normal. OTHER: Moderate amount  of low-density ascites. No intraperitoneal free air. MUSCULOSKELETAL: Anasarca. LEFT anterior abdominal wall scarring. Bilateral sacral in ischial tuberosity ulcers with packing material. Severe osteopenia. Sclerotic cough 6 with bony reabsorption. IMPRESSION: Gas and contrast distended large bowel compatible with ileus. No bowel obstruction. Tenting of the stomach to the anterior abdominal wall at site of prior gastrostomy. Worsening anasarca, increasing moderate ascites. Severe atherosclerosis. 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 Electronically Signed   By: Elon Alas M.D.   On: 11/13/2015 00:32   Dg Abd 1 View  Result Date: 11/12/2015 CLINICAL DATA:  Nasogastric tube placement. EXAM: ABDOMEN - 1 VIEW COMPARISON:  11/05/2015 radiographs FINDINGS: The tip of a gastric tube included side-port are seen below the level the left hemidiaphragm in the expected location of the stomach. Moderate gaseous distention of small and large bowel in a nonspecific bowel gas pattern is identified. There is no pneumoperitoneum. Right atrial and right ventricular pacing wires are noted of the visualized heart which is mildly enlarged. Small bilateral effusions. IMPRESSION: Gastric tube tip and side port in the expected location of stomach. Nonspecific bowel gas pattern with mild to moderate gaseous distention of large and small bowel. Electronically Signed   By: Ashley Royalty M.D.   On: 11/12/2015 23:51   Dg Chest Port 1 View  Result Date: 11/13/2015 CLINICAL DATA:  Respiratory failure. EXAM: PORTABLE CHEST 1 VIEW COMPARISON:  11/12/2015. FINDINGS: Tracheostomy tube and left IJ line in stable position. Cardiac pacer stable position. Heart size stable. Low lung volumes. Persistent bilateral lower lobe airspace disease . Persistent bilateral pleural effusions. Prior cervical spine fusion. IMPRESSION: 1. Interim placement of NG tube, its tip is below left hemidiaphragm. Tracheostomy tube  and left IJ line stable position. 2. Persistent low lung volumes. Persistent unchanged bilateral lower lobe airspace disease and small pleural effusions. Electronically Signed   By: Marcello Moores  Register   On: 11/13/2015 07:31   Dg Chest Port 1 View  Result Date: 11/12/2015 CLINICAL DATA:  Pneumonia EXAM: PORTABLE CHEST 1 VIEW COMPARISON:  11/09/2015 FINDINGS: Left pacer remains in place, unchanged. Interval placement tracheostomy tube with the tip projecting over the mid trachea. Left central line tip in the SVC. Bilateral lower lobe airspace opacities with layering effusions. Aeration somewhat worsened since prior study. IMPRESSION: Worsening bilateral lower lobe airspace opacities and small layering effusions. Electronically Signed   By: Rolm Baptise M.D.   On: 11/12/2015 12:53     STUDIES:  CT chest 10/12 >> occlusion of Rt bronchus intermedius, basilar consolidation, small Rt effusion Bronchoscopy 10/13 >> significant purulent secretions airways CT Pelvis 10/16 >> no evidence of emphysematous fasciitis along the perineum, stage IV decubitus ulcers along the sacrum / ischial tuberosities associated with chronic osteomyelitis CT abdomen 10/24 > ileus, anasarca  CULTURES: Blood 10/11 >> neg Urine 10/11 >> Acinetobacter, yeast Sputum 10/13 >> neg  ANTIBIOTICS: Vancomycin 10/11 >> 10/15 Levaquin 10/11 >> 10/12 Aztreonam 10/11 >> 10/12  Diflucan 10/12 >> 10/14 Primaxin 10/12 >> 10/22  SIGNIFICANT EVENTS: 10/11  Admit 10/13  VDRF, pressors, bronch 10/15  Vasopressors off  10/16  Fever, weaning on PSV 10/5  10/17  Extubated, failed and reintubated overnight 10/20  Weaning on PSV, plan for trach 10/23 10/23 Tracheostomy  LINES/TUBES: Lt femoral aline 10/13 >> 10/14 ETT 10/13 >> 10/17, 10/18 >>  Lt IJ CVL 10/13 >>  DISCUSSION: 45 yo female from home with lethargy and respiratory distress.  Found to have infected sacral wounds (present prior to this admission).  She has hx of C2-C7  posterior fusion in September 2015 after traumatic spinal cord injury with paraplegia (fell down stairs while intoxicated).  She developed progressive hypotension and respiratory failure from HCAP and a UTI leading to septic shock.  Now with slow weaning off vent due to heavy respiratory secretions and weak cough.  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, recent steroids as outpatient Hx of SSS s/p PM P:  Monitor blood pressure Continue telemetry monitoring d/c stress dose steroids 10/24 MAP > 60 acceptable  Continue midodrine  RENAL A:   Neurogenic bladder Hypokalemia > improved P:   Monitor BMET and UOP Replace electrolytes as needed   GASTROINTESTINAL A:   Moderate protein calorie malnutrition Constipation  Ileus  P:   Give dulcolax suppository for constipation, continue colace NG tube today to LWS Restart trickle feeds 10/25 PEG consult 10/25 if it doesn't look like she'll come off vent soon Stop IV fentanyl, minimze narcotics  HEMATOLOGIC A:   Leukocytosis Anemia - suspect component of dilution + phlebotomy, no evidence of bleeding  P:  F/u CBC Lovenox for DVT prophylaxis Transfuse per ICU guidelines   INFECTIOUS A:   HCAP Decubitus ulcers - extensive wounds on sacrum, legs, back of head Acinetobacter + Yeast UTI  P:   Culture if febrile Wound care  ENDOCRINE A:   Hyperglycemia - iatrogenic, stress steroids P:   Monitor glucose on BMP Sensitive SSI while on stress steroids Stop stress dose steroids  NEUROLOGIC A:   C2-C7 traumatic spinal cord injury Acute metabolic encephalopathy > improving Pain  P:   RASS goal: 0  Continue effexor (in  place of home cymbalta as unable to crush) Neurontin 300 mg TID Continue home oxycodone PRN  D/c fentanyl Continue home baclofen at reduced dose, 10 mg TID Likely will need LTAC for rehab efforts if patient willing    Goals of care >> Palliative medicine met with family on 10/14, patient desires full code, Tracheostomy now placed.  Will likely need LTAC  Family:  Mother updated in person 10/23, see prior notes.    My cc time 37 minutes   Roselie Awkward, MD [facility] Pager: [phone] Cell: [phone] After 3pm or if no response, call [phone]

## 2015-11-13 NOTE — Progress Notes (Signed)
Pt.'s mom disclosed that patient wears contacts. Educated on importance of removing and cleaning them. Patient does not have glasses as back-up. Pt.'s mom is to bring solution when she visits next (tomorrow, 11/14/15) to clean.  Waynard Reeds, RN

## 2015-11-13 NOTE — Progress Notes (Signed)
PICC nurse assessed patient and determined that veins are too small for a PICC, recommended placing tunneled catheter. [facility] called and notified. No orders at this time, waiting for MD to round in morning to decide next step.   Waynard Reeds, RN

## 2015-11-13 NOTE — Progress Notes (Signed)
Assessed right upper arm for PICC line placement and was unable to place PICC due to veins being to small to accommodate the catheter .  The basilic vein to small to measure and with  the brachial vein the catheter occupies 86% of the vein . We can't use left arm due to left sided pacer.  I would recommend IR to place a tunneled catheter on the chest. Staff nurse aware and has notified MD.   Hoyt Koch Vascular Access Specialist       Renee` [facility]

## 2015-11-13 NOTE — Progress Notes (Signed)
Patient returned to full support on vent. Patient on previous PRVC settings. Tolerating well; RN at bedside to perform sterile procedure. RT will continue to monitor patient.

## 2015-11-14 ENCOUNTER — Inpatient Hospital Stay (HOSPITAL_COMMUNITY): Payer: Medicaid Other

## 2015-11-14 LAB — BASIC METABOLIC PANEL
Anion gap: 7 (ref 5–15)
Anion gap: 6 (ref 5–15)
BUN: 12 mg/dL (ref 6–20)
BUN: 10 mg/dL (ref 6–20)
CALCIUM: 8 mg/dL — AB (ref 8.9–10.3)
CO2: 32 mmol/L (ref 22–32)
CO2: 30 mmol/L (ref 22–32)
Calcium: 7.9 mg/dL — ABNORMAL LOW (ref 8.9–10.3)
Chloride: 101 mmol/L (ref 101–111)
Chloride: 102 mmol/L (ref 101–111)
GLUCOSE: 71 mg/dL (ref 65–99)
Glucose, Bld: 89 mg/dL (ref 65–99)
Potassium: 2.9 mmol/L — ABNORMAL LOW (ref 3.5–5.1)
Potassium: 4.1 mmol/L (ref 3.5–5.1)
SODIUM: 140 mmol/L (ref 135–145)
Sodium: 138 mmol/L (ref 135–145)

## 2015-11-14 LAB — GLUCOSE, CAPILLARY
GLUCOSE-CAPILLARY: 82 mg/dL (ref 65–99)
Glucose-Capillary: 62 mg/dL — ABNORMAL LOW (ref 65–99)
Glucose-Capillary: 76 mg/dL (ref 65–99)
Glucose-Capillary: 77 mg/dL (ref 65–99)
Glucose-Capillary: 78 mg/dL (ref 65–99)
Glucose-Capillary: 73 mg/dL (ref 65–99)

## 2015-11-14 MED ORDER — DEXTROSE 50 % IV SOLN
12.5000 g | Freq: Once | INTRAVENOUS | Status: AC
  Administered 2015-11-14: 12.5 g via INTRAVENOUS

## 2015-11-14 MED ORDER — DEXTROSE 50 % IV SOLN
INTRAVENOUS | Status: AC
  Filled 2015-11-14: qty 50

## 2015-11-14 MED ORDER — ALTEPLASE 2 MG IJ SOLR
2.0000 mg | Freq: Once | INTRAMUSCULAR | Status: DC
  Filled 2015-11-14: qty 2

## 2015-11-14 MED ORDER — VITAL HIGH PROTEIN PO LIQD
1000.0000 mL | ORAL | Status: DC
  Administered 2015-11-14: 1000 mL
  Filled 2015-11-14: qty 1000

## 2015-11-14 MED ORDER — VITAL AF 1.2 CAL PO LIQD
1000.0000 mL | ORAL | Status: DC
  Administered 2015-11-14 – 2015-11-18 (×5): 1000 mL
  Filled 2015-11-14 (×8): qty 1000

## 2015-11-14 MED ORDER — POTASSIUM CHLORIDE 10 MEQ/50ML IV SOLN
INTRAVENOUS | Status: AC
  Administered 2015-11-14: 10 meq
  Filled 2015-11-14: qty 50

## 2015-11-14 MED ORDER — VENLAFAXINE HCL 75 MG PO TABS
75.0000 mg | ORAL_TABLET | Freq: Two times a day (BID) | ORAL | Status: DC
  Administered 2015-11-14 – 2015-11-27 (×26): 75 mg
  Filled 2015-11-14 (×27): qty 1

## 2015-11-14 MED ORDER — FREE WATER
100.0000 mL | Freq: Three times a day (TID) | Status: DC
  Administered 2015-11-14 – 2015-11-26 (×35): 100 mL

## 2015-11-14 MED ORDER — SODIUM CHLORIDE 0.9% FLUSH: 10.0000 mL | INTRAVENOUS | Status: DC | PRN

## 2015-11-14 MED ORDER — POTASSIUM CHLORIDE 10 MEQ/50ML IV SOLN
10.0000 meq | INTRAVENOUS | Status: AC
  Administered 2015-11-14 (×6): 10 meq via INTRAVENOUS
  Filled 2015-11-14 (×5): qty 50

## 2015-11-14 MED ORDER — POTASSIUM CHLORIDE 20 MEQ/15ML (10%) PO SOLN
40.0000 meq | Freq: Once | ORAL | Status: AC
  Administered 2015-11-14: 40 meq
  Filled 2015-11-14: qty 30

## 2015-11-14 NOTE — Evaluation (Signed)
SLP Cancellation Note  Patient Details Name: Angela Page MRN: [id number] DOB: [birth year]   Cancelled treatment:       Reason Eval/Treat Not Completed: Other (comment) (order for "speech therapy" received, from chart review - order interpreted to be swallow evaluation -    Per Dr Anastasia Pall note from today, if pt is able to stay completely off the ventilator then we can consider swallowing evaluations by speech therapy within 24-48 hours. However, considering her multiple wounds she may need to have a PEG tube.  )  SLP will follow up for swallow evaluation readiness, also given pt has trach - does MD desire PMSV also?    Janett Labella [place], [place] [facility] SLP [phone]

## 2015-11-14 NOTE — Progress Notes (Signed)
Nutrition Follow-up  DOCUMENTATION CODES:   Non-severe (moderate) malnutrition in context of acute illness/injury, Underweight  INTERVENTION:  - Initiate Vital 1.2 @ 15 mL/hr and increase by 10 mL every 8 hours to reach goal rate of Vital 1.2 @ 65 mL/hr. At goal rate, this TF regimen will provide 1872 kcal, 117 grams of protein (95% estimated protein need), and 1265 mL free water.  - Continue free water flush per MD/NP order. - RD will follow-up 10/26.  NUTRITION DIAGNOSIS:   Inadequate oral intake related to inability to eat as evidenced by NPO status. -ongoing  GOAL:   Patient will meet greater than or equal to 90% of their needs -unmet with TF currently at trickle rate.  MONITOR:   TF tolerance, Weight trends, Labs, Skin, I & O's  ASSESSMENT:   45 y.o. 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 Presented with worsening confusion status decreased by mouth intake foul-smelling decubitus ulcer. Apparently has been more sedated than usual at home; family did endorse that patient may have possibly received more narcotics than usual. Family also states that she has had decreased by mouth intake. Mother was very concerned that patient developed lung cancer based on the fact that her father died of it and want patient to be tested extensively. In ED patient required Narcan 2 and responded: suggestive of opioid overdose contributing to oversedation  10/25 Pt now on trach collar. NGT remains in place and trickle rate TF initiated this AM per order: Vital High Protein @ 15 mL/hr. This regimen is providing 360 kcal, 32 grams of protein, and 301 mL free water. Estimated nutrition needs updated this AM as pt now off of ventilator support. Needs  based on paraplegia as well as multiple severe wounds. Weight -3.4 kg from yesterday.   CCM NP note from this AM reviewed. Goal of MAP >60. IR consulted today for PEG; IR hopeful for ileus improving before placement of PEG. NP note indicates to advance TF to goal. TF regimen and advancement outlined above to meet estimated nutrition needs. Per MD/NP order, free water flush of 100 mL TID.   Pt denies abdominal pain or nausea at time of RD visit. She is able to mouth words and make some slight breathy, noises to communicate.   Medications reviewed; 100 mg Colace BID, sliding scale Novolog, PRN IV Zofran, 10 mEq IV Kcl x6 runs today, 40 mEq KCl per NGT x1 dose today, 5 mL Senokot BID.  Labs reviewed; CBGs: 62-77 mg/dL this AM, K: 2.9 mmol/L, creatinine: <0.3 mg/dL, Ca: 7.9 mg/dL.    10/24 - Trach procedure performed shortly before 89 yesterday.  - NGT now in place.  - Note from yesterday at 1710 reviewed concerning ileus with order for NGT to LIS.  - Per Dr. Anastasia Pall note this AM, plan for trach collar trial today, plan for Dulcolax today with continued Colace and okay to start trickle TF tomorrow. -  Also per note, plan for PEG consult tomorrow.  - Reviewed CT abdomen results from yesterday which indicated worsening anasarca and moderate increasing ascites.   Patient is currently with trach on ventilator support MV: 5.1 L/min Temp (24hrs), Avg:96.6 F (35.9 C), Min:95.9 F (35.5 C), Max:97 F (36.1 C) Drip: Precedex @ 0.4 mcg/kg/hr.     10/23 - Pt continues with OGT.  -  TF turned to off at midnight in anticipation of trach today.  - Estimated nutrition needs updated this AM and based on weight from 10/20 (61.6) as weight is +3.6 kg since that time.  Patient is currently intubated on ventilator support MV: 7.9L/min Temp (24hrs), Avg:96.9 F (36.1 C), Min:96.3 F (35.7 C), Max:97.8 F (36.6 C) BP: 101/72 and MAP: 81 IVF:NS @ 50 mL/hr. Drip:Precedex @ 1 mcg/kg/hr.     Diet Order:  Diet NPO time specified  Skin: Stage 4 pressure injuries to coccyx and bilateral buttocks, Stage 1 pressure injuries to mid, upper, bilateral back and L shoulder, Unstageable wound to cervical area.       Last BM:  10/25  Height:   Ht Readings from Last 1 Encounters:  11/07/15 5\' 2"  (1.575 m)    Weight:   Wt Readings from Last 1 Encounters:  11/14/15 136 lb 3.9 oz (61.8 kg)    Ideal Body Weight:  47.5 kg  BMI:  Body mass index is 24.92 kg/m.  Estimated Nutritional Needs:   Kcal:  1850-1980 (30-32 kcal/kg)  Protein:  123 grams (2 grams/kg)  Fluid:  >/= 1.8 L/day  EDUCATION NEEDS:   No education needs identified at this time    Jarome Matin, MS, RD, LDN Inpatient Clinical Dietitian Pager # [phone] After hours/weekend pager # [phone]

## 2015-11-14 NOTE — Progress Notes (Signed)
PULMONARY / CRITICAL CARE MEDICINE   Name: Angela Page MRN: [id number] DOB: 1971/01/11    ADMISSION DATE:  10/31/2015 CONSULTATION DATE:  11/01/2015  REFERRING MD:  Dr. Roel Cluck, [facility]  CHIEF COMPLAINT:  Short of breath  SUBJECTIVE:   Comfortable on ATC VITAL SIGNS: BP (!) 88/49   Pulse 87   Temp 98.2 F (36.8 C)   Resp 17   Ht 5' 2" (1.575 m)   Wt 136 lb 3.9 oz (61.8 kg)   SpO2 100%   BMI 24.92 kg/m   HEMODYNAMICS:    VENTILATOR SETTINGS: Vent Mode: PRVC FiO2 (%):  [30 %-40 %] 40 % Set Rate:  [20 bmp] 20 bmp Vt Set:  [400 mL] 400 mL PEEP:  [5 cmH20] 5 cmH20 Plateau Pressure:  [16 cmH20-19 cmH20] 16 cmH20  INTAKE / OUTPUT: I/O last 3 completed shifts: In: 196.8 [I.V.:196.8] Out: 1495 [Urine:595; Emesis/NG output:900]  PHYSICAL EXAMINATION: General:  Chronically ill appearing female, on ATC. No distress and in good spirits  HENT: Tracheostomy site clean, dry, no bleeding PULM: CTA B, vent supported breaths; no wheezing or rhonchi  CV: RRR, no mgr GI: mild distension Derm: anasarca noted, thin skin Neuro: Awake, alert, following commands  LABS:  BMET  Recent Labs Lab 11/12/15 0510 11/13/15 0525 11/14/15 0518  NA 142 140 140  K 3.0* 3.5 2.9*  CL 107 104 101  CO2 30 31 32  BUN _0 CREATININE <0.30* <0.30* <0.30*  GLUCOSE 116* 80 71    Electrolytes  Recent Labs Lab 11/08/15 0530 11/09/15 0621  11/12/15 0510 11/13/15 0525 11/14/15 0518  CALCIUM 8.3* 7.7*  < > 7.9* 8.0* 7.9*  MG 1.8 2.0  --  1.8  --   --   < > = values in this interval not displayed.  CBC  Recent Labs Lab 11/12/15 0510 11/13/15 0550 11/13/15 1003  WBC 11.4* 9.5 9.8  HGB 7.4* 7.0* 7.3*  HCT 22.7* 21.2* 22.2*  PLT 446* 452* 476*    Coag's  Recent Labs Lab 11/12/15 0510  INR 1.04    Sepsis Markers No results for input(s): LATICACIDVEN, PROCALCITON, O2SATVEN in the last 168 hours.  ABG No results for input(s): PHART, PCO2ART, PO2ART in the last  168 hours.  Liver Enzymes No results for input(s): AST, ALT, ALKPHOS, BILITOT, ALBUMIN in the last 168 hours.  Cardiac Enzymes No results for input(s): TROPONINI, PROBNP in the last 168 hours.  Glucose  Recent Labs Lab 11/13/15 1654 11/13/15 1925 11/13/15 2318 11/14/15 0324 11/14/15 0536 11/14/15 0802  GLUCAP 99 79 77 62* 76 77    Imaging Dg Chest Port 1 View  Result Date: 11/14/2015 CLINICAL DATA:  45 year old female with pneumonia. EXAM: PORTABLE CHEST 1 VIEW COMPARISON:  Chest radiograph dated 11/13/2015 FINDINGS: Tracheostomy with tip above the carina in stable positioning. Enteric tube courses into the left hemi abdomen with tip side-port below the diaphragm and tip beyond the inferior margin of the image. No significant interval change in the small bilateral pleural effusions with bibasilar hazy airspace densities. There is no pneumothorax. Stable cardiac silhouette. Left pectoral pacemaker device. Partially visualized cervical fusion hardware. No acute fracture. IMPRESSION: No significant interval change in the small bilateral pleural effusions and lower lobe airspace densities. Support line and tube in stable positioning. Electronically Signed   By: Anner Crete M.D.   On: 11/14/2015 06:34     STUDIES:  CT chest 10/12 >> occlusion of Rt bronchus intermedius, basilar consolidation, small Rt effusion Bronchoscopy  10/13 >> significant purulent secretions airways CT Pelvis 10/16 >> no evidence of emphysematous fasciitis along the perineum, stage IV decubitus ulcers along the sacrum / ischial tuberosities associated with chronic osteomyelitis CT abdomen 10/24 > ileus, anasarca  CULTURES: Blood 10/11 >> neg Urine 10/11 >> Acinetobacter, yeast Sputum 10/13 >> neg  ANTIBIOTICS: Vancomycin 10/11 >> 10/15 Levaquin 10/11 >> 10/12 Aztreonam 10/11 >> 10/12  Diflucan 10/12 >> 10/14 Primaxin 10/12 >> 10/22  SIGNIFICANT EVENTS: 10/11  Admit 10/13  VDRF, pressors,  bronch 10/15  Vasopressors off  10/16  Fever, weaning on PSV 10/5 10/17  Extubated, failed and reintubated overnight 10/20  Weaning on PSV, plan for trach 10/23 10/23 Tracheostomy  LINES/TUBES: Lt femoral aline 10/13 >> 10/14 ETT 10/13 >> 10/17, 10/18 >>  Lt IJ CVL 10/13 >>  DISCUSSION: 45 yo female from home with lethargy and respiratory distress.  Found to have infected sacral wounds (present prior to this admission).  She has hx of C2-C7 posterior fusion in September 2015 after traumatic spinal cord injury with paraplegia (fell down stairs while intoxicated).  She developed progressive hypotension and respiratory failure from HCAP and a UTI leading to septic shock.  Now with slow weaning off vent due to heavy respiratory secretions and weak cough. Now working on ATC trials. Wants to eat. No distress this am. Will have SLP eval her to start PMV trials  ASSESSMENT / PLAN:  PULMONARY A: Acute hypoxic/hypercapnic respiratory failure 2nd to sepsis, HCAP > improved Hx of asthma Tracheostomy status Tobacco abuse Need to determine if she can be liberated from the vent or not.  P:   ATC as tolerated w/ rest back on vent as needed.  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, recent steroids as outpatient Hx of SSS s/p PM P:  Monitor blood pressure Continue telemetry monitoring MAP > 60 acceptable  Continue midodrine  RENAL A:   Neurogenic bladder Hypokalemia > intermittent  P:   Monitor BMET and UOP Replace electrolytes as needed   GASTROINTESTINAL A:   Moderate protein calorie malnutrition Constipation  Ileus  P:   Adv tube feeds to goal IR consulted for PEG 10/25; they want to see ileus improved before PEG  K goal > 4 Stopped IV fentanyl, minimze narcotics If she cont to improve she may be able to have swallow eval  HEMATOLOGIC A:   Leukocytosis Anemia -  suspect component of dilution + phlebotomy, no evidence of bleeding  P:  F/u CBC Lovenox for DVT prophylaxis Transfuse per ICU guidelines   INFECTIOUS A:   HCAP Decubitus ulcers - extensive wounds on sacrum, legs, back of head Acinetobacter + Yeast UTI  P:   Culture if febrile Wound care  ENDOCRINE A:   Hyperglycemia - iatrogenic, stress steroids P:   Monitor glucose on BMP Sensitive SSI while on stress steroids Stop stress dose steroids  NEUROLOGIC A:   C2-C7 traumatic spinal cord injury Acute metabolic encephalopathy > improving Pain  P:   RASS goal: 0  Continue effexor (in place of home cymbalta as unable to crush) Neurontin 300 mg TID Continue home oxycodone PRN  D/c'd fentanyl Continue home baclofen at reduced dose, 10 mg TID Likely will need LTAC for rehab efforts if patient willing    Goals of care >> Palliative medicine met with family on 10/14, patient desires full code, Tracheostomy now placed.  Will likely need LTAC  Family:  Mother updated in person 10/23, see prior notes.    Erick Colace ACNP-BC [place] Pager # [phone] OR # [phone] if no answer

## 2015-11-14 NOTE — Progress Notes (Addendum)
[facility] Progress Note Patient Name: Tzipa Appiah DOB: 26-Oct-1970 MRN: [id number]   Date of Service  11/14/2015  HPI/Events of Note  K+ = 2.9 and Creatinine < 0.30.  eICU Interventions  Will order: 1. Replace K+. 2. Repeat BMP at 2 PM.      Intervention Category Intermediate Interventions: Electrolyte abnormality - evaluation and management  Sommer,Steven Eugene 11/14/2015, 6:23 AM

## 2015-11-15 LAB — BASIC METABOLIC PANEL
ANION GAP: 6 (ref 5–15)
BUN: 8 mg/dL (ref 6–20)
CALCIUM: 7.9 mg/dL — AB (ref 8.9–10.3)
CO2: 31 mmol/L (ref 22–32)
Chloride: 102 mmol/L (ref 101–111)
Creatinine, Ser: <0.3 mg/dL — ABNORMAL LOW (ref 0.44–1.00)
Glucose, Bld: 94 mg/dL (ref 65–99)
POTASSIUM: 3.3 mmol/L — AB (ref 3.5–5.1)
Sodium: 139 mmol/L (ref 135–145)

## 2015-11-15 LAB — GLUCOSE, CAPILLARY
GLUCOSE-CAPILLARY: 104 mg/dL — AB (ref 65–99)
GLUCOSE-CAPILLARY: 104 mg/dL — AB (ref 65–99)
Glucose-Capillary: 123 mg/dL — ABNORMAL HIGH (ref 65–99)
Glucose-Capillary: 80 mg/dL (ref 65–99)
Glucose-Capillary: 80 mg/dL (ref 65–99)
Glucose-Capillary: 94 mg/dL (ref 65–99)
Glucose-Capillary: 96 mg/dL (ref 65–99)

## 2015-11-15 MED ORDER — POTASSIUM CHLORIDE 20 MEQ/15ML (10%) PO SOLN
40.0000 meq | ORAL | Status: AC
  Administered 2015-11-15 (×3): 40 meq via ORAL
  Filled 2015-11-15 (×3): qty 30

## 2015-11-15 NOTE — Evaluation (Signed)
[facility] Speaking Valve - Evaluation Patient Details  Name: Angela Page MRN: [id number] Date of Birth: [birth year]  Today's Date: 11/15/2015 Time: 1725-1749 SLP Time Calculation (min) (ACUTE ONLY): 24 min  Past Medical History:  Past Medical History:  Diagnosis Date  . Asthma   . GERD (gastroesophageal reflux disease)   . Pacemaker   . Pleurisy   . Quadriparesis [facility]    Past Surgical History:  Past Surgical History:  Procedure Laterality Date  . APPENDECTOMY    . CARDIAC SURGERY    . I&D EXTREMITY  10/28/2011   Procedure: IRRIGATION AND DEBRIDEMENT EXTREMITY;  Surgeon: Tennis Must, MD;  Location: [facility];  Service: Orthopedics;  Laterality: Right;  Irrigation and debridement right middle finger   HPI:  45 yo female adm to [facility] with leukocytosis and AMS 10/31/15.  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  Pt with h/o C2-C7 fusion and C3-C6 osteophytic changes per imaging study.  Pt resides with her caregiver mom per RN.  Hospital coarse complicated by pt requiring intubation for respiratory compromise, found to have occlusion of right bronchus with collapse of RM and RL lungs concerning for aspiration, left lung findings also concerning for aspiration.  Pt was placed on vent 10/12-10/17 - reintubated evening of 10/17 and trach placed 10/23.  Pt has been off the vent today full day and tolerating well.  Recent hospitalization for pna noted; pt coded and required defibrillation.  She also has h/o vent use for 3 months and trach *removed previously.  PMSV and swallow eval ordered.    Assessment / Plan / Recommendation Clinical Impression  Pt demonstrated wonderful tolerance of PMSV, easily able to redirect air with finger occulsion.  RN suctioned pt (per pt request) with removal of minimal clear secretions.  Pt also able to expectorate some secretions orally with trach occlusion.  Voice is mildly hoarse but strong and speech is intelligible.   All vitals stable throughout trial.  Using teach back and diagram, educated pt to indication/precautions with PMSV use.  Recommend pt use PMSV as tolerated, pt demonstrated ability to use call bell for assistance.  Will follow up briefly re: PMSV for tolerance, education.      SLP Assessment  Patient needs continued Speech Lanaguage Pathology Services    Follow Up Recommendations   (tbd)    Frequency and Duration min 2x/week  1 week    PMSV Trial PMSV was placed for: communication Able to redirect subglottic air through upper airway: Yes Able to Attain Phonation: Yes Voice Quality: Hoarse Able to Expectorate Secretions: Yes Level of Secretion Expectoration with PMSV: Oral Breath Support for Phonation: Adequate Intelligibility: Intelligible Respirations During Trial: 20 (17-21) SpO2 During Trial: 95 % (95-97) Pulse During Trial: 86 (86-97) Behavior: Alert;Expresses self well;Good eye contact;Responsive to questions   Tracheostomy Tube    #6 Shiley cuffed   Vent Dependency    currently off vent for more than 24 hours   Cuff Deflation Trial  GO Tolerated Cuff Deflation: Yes Length of Time for Cuff Deflation Trial: deflated throughout day, also for entire session (48 minutes) Behavior: Alert        Claudie Fisherman, [place] [facility] SLP [phone]

## 2015-11-15 NOTE — Progress Notes (Signed)
eLink Physician-Brief Progress Note Patient Name: Angela Page DOB: 01-09-1971 MRN: [id number]   Date of Service  11/15/2015  HPI/Events of Note  Nursing requesting sedation/ Quad/ Restless on trach collar/ does not appear to have any ability to cough at all  and ? How much air she's actually moving   eICU Interventions  Revent prn/ no IV sedation      Intervention Category Major Interventions: Acid-Base disturbance - evaluation and management  Christinia Gully 11/15/2015, 8:41 PM

## 2015-11-15 NOTE — Progress Notes (Signed)
PULMONARY / CRITICAL CARE MEDICINE   Name: Angela Page MRN: [id number] DOB: [birth year]    ADMISSION DATE:  10/31/2015 CONSULTATION DATE:  11/01/2015  REFERRING MD:  Dr. Roel Cluck, [facility]  CHIEF COMPLAINT:  Short of breath  SUBJECTIVE:   Comfortable on ATC, did go back on vent last night. Wants to eat.   VITAL SIGNS: BP (!) 87/57   Pulse 90   Temp 98.8 F (37.1 C)   Resp 20   Ht '5\' 2"'$  (1.575 m)   Wt 133 lb 6.1 oz (60.5 kg)   SpO2 100%   BMI 24.40 kg/m   HEMODYNAMICS:    VENTILATOR SETTINGS: Vent Mode: PRVC FiO2 (%):  [30 %-40 %] 30 % Set Rate:  [20 bmp] 20 bmp Vt Set:  [400 mL] 400 mL PEEP:  [5 cmH20] 5 cmH20 Plateau Pressure:  [17 cmH20-21 cmH20] 19 cmH20  INTAKE / OUTPUT: I/O last 3 completed shifts: In: 1583.9 [I.V.:3; Other:120; NG/GT:1110.9; IV Piggyback:350] Out: 6270 [Urine:2330; Emesis/NG output:700; Stool:100]  PHYSICAL EXAMINATION: General:  Chronically ill appearing female, on ATC. No distress and in good spirits, wants to eat   HENT: Tracheostomy site clean, dry, no bleeding PULM: CTA B, vent supported breaths; no wheezing or rhonchi  CV: RRR, no mgr GI: mild distension Derm: anasarca noted, thin skin Neuro: Awake, alert, following commands  LABS:  BMET  Recent Labs Lab 11/14/15 0518 11/14/15 1720 11/15/15 0359  NA 140 138 139  K 2.9* 4.1 3.3*  CL 101 102 102  CO2 32 30 31  BUN '12 10 8  '$ CREATININE <0.30* <0.30* <0.30*  GLUCOSE 71 89 94    Electrolytes  Recent Labs Lab 11/09/15 0621  11/12/15 0510  11/14/15 0518 11/14/15 1720 11/15/15 0359  CALCIUM 7.7*  < > 7.9*  < > 7.9* 8.0* 7.9*  MG 2.0  --  1.8  --   --   --   --   < > = values in this interval not displayed.  CBC  Recent Labs Lab 11/12/15 0510 11/13/15 0550 11/13/15 1003  WBC 11.4* 9.5 9.8  HGB 7.4* 7.0* 7.3*  HCT 22.7* 21.2* 22.2*  PLT 446* 452* 476*    Coag's  Recent Labs Lab 11/12/15 0510  INR 1.04    Sepsis Markers No results for input(s):  LATICACIDVEN, PROCALCITON, O2SATVEN in the last 168 hours.  ABG No results for input(s): PHART, PCO2ART, PO2ART in the last 168 hours.  Liver Enzymes No results for input(s): AST, ALT, ALKPHOS, BILITOT, ALBUMIN in the last 168 hours.  Cardiac Enzymes No results for input(s): TROPONINI, PROBNP in the last 168 hours.  Glucose  Recent Labs Lab 11/14/15 1157 11/14/15 1602 11/14/15 1953 11/15/15 0029 11/15/15 0352 11/15/15 0746  GLUCAP 82 78 73 80 94 96    Imaging No results found.   STUDIES:  CT chest 10/12 >> occlusion of Rt bronchus intermedius, basilar consolidation, small Rt effusion Bronchoscopy 10/13 >> significant purulent secretions airways CT Pelvis 10/16 >> no evidence of emphysematous fasciitis along the perineum, stage IV decubitus ulcers along the sacrum / ischial tuberosities associated with chronic osteomyelitis CT abdomen 10/24 > ileus, anasarca  CULTURES: Blood 10/11 >> neg Urine 10/11 >> Acinetobacter, yeast Sputum 10/13 >> neg  ANTIBIOTICS: Vancomycin 10/11 >> 10/15 Levaquin 10/11 >> 10/12 Aztreonam 10/11 >> 10/12  Diflucan 10/12 >> 10/14 Primaxin 10/12 >> 10/22  SIGNIFICANT EVENTS: 10/11  Admit 10/13  VDRF, pressors, bronch 10/15  Vasopressors off  10/16  Fever, weaning on PSV 10/5 10/17  Extubated, failed and reintubated overnight 10/20  Weaning on PSV, plan for trach 10/23 10/23 Tracheostomy  LINES/TUBES: Lt femoral aline 10/13 >> 10/14 ETT 10/13 >> 10/17, 10/18 >>  Lt IJ CVL 10/13 >>  DISCUSSION: 45 yo female from home with lethargy and respiratory distress.  Found to have infected sacral wounds (present prior to this admission).  She has hx of C2-C7 posterior fusion in September 2015 after traumatic spinal cord injury with paraplegia (fell down stairs while intoxicated).  She developed progressive hypotension and respiratory failure from HCAP and a UTI leading to septic shock.  Now with slow weaning off vent due to heavy respiratory  secretions and weak cough. Now working on ATC trials. Awaiting SLP eval. Will start w/ PMV, then can address swallowing. I am concerned that the #6 cuffed trach may be a little bit of a barrier to success here. We will try to push ATC trials in hopes that we can get her off the vent and subsequently down size to 6 cuffless or even 4 cuffless both which would facilitate better phonation and swallowing.    ASSESSMENT / PLAN:  PULMONARY A: Acute hypoxic/hypercapnic respiratory failure 2nd to sepsis, HCAP > improved Hx of asthma Tracheostomy status Tobacco abuse Need to determine if she can be liberated from the vent or not.  P:   ATC as tolerated w/ rest back on vent as needed.  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, recent steroids as outpatient Hx of SSS s/p PM P:  Monitor blood pressure Continue telemetry monitoring MAP > 60 acceptable  Continue midodrine  RENAL A:   Neurogenic bladder Hypokalemia > intermittent  P:   Monitor BMET and UOP Replace electrolytes as needed   GASTROINTESTINAL A:   Moderate protein calorie malnutrition Constipation  Ileus  P:   Adv tube feeds to goal IR consulted for PEG 10/25; they want to see ileus improved before PEG  K goal > 4 Stopped IV fentanyl, minimze narcotics If she cont to improve she may be able to have swallow eval  HEMATOLOGIC A:   Leukocytosis Anemia - suspect component of dilution + phlebotomy, no evidence of bleeding  P:  F/u CBC Lovenox for DVT prophylaxis Transfuse per ICU guidelines   INFECTIOUS A:   HCAP Decubitus ulcers - extensive wounds on sacrum, legs, back of head Acinetobacter + Yeast UTI  P:   Culture if febrile Wound care  ENDOCRINE A:   Hyperglycemia - iatrogenic, stress steroids P:   Monitor glucose on BMP Sensitive SSI while on stress steroids Stop stress dose  steroids  NEUROLOGIC A:   C2-C7 traumatic spinal cord injury Acute metabolic encephalopathy > improving Pain  P:   RASS goal: 0  Continue effexor (in place of home cymbalta as unable to crush) Neurontin 300 mg TID Continue home oxycodone PRN  D/c'd fentanyl Continue home baclofen at reduced dose, 10 mg TID Likely will need LTAC for rehab efforts if patient willing    Goals of care >> Palliative medicine met with family on 10/14, patient desires full code, Tracheostomy now placed.  Will likely need LTAC  Family:  Mother updated in person 10/23, see prior notes.    Erick Colace ACNP-BC [place] Pager # [phone] OR # [phone] if no answer

## 2015-11-15 NOTE — Progress Notes (Signed)
Date:  November 15, 2015 Chart reviewed for concurrent status and case management needs. Will continue to follow the patient for status change: weaning on trache collar/vent at pm and prn Discharge Planning: following for needs Expected discharge date: [id number] Angela Page, [place], [place], [place]

## 2015-11-15 NOTE — Evaluation (Signed)
Clinical/Bedside Swallow Evaluation Patient Details  Name: Angela Page MRN: [id number] Date of Birth: 07/05/1970  Today's Date: 11/15/2015 Time: SLP Start Time (ACUTE ONLY): 1750 SLP Stop Time (ACUTE ONLY): 1811 SLP Time Calculation (min) (ACUTE ONLY): 21 min  Past Medical History:  Past Medical History:  Diagnosis Date  . Asthma   . GERD (gastroesophageal reflux disease)   . Pacemaker   . Pleurisy   . Quadriparesis [facility])    Past Surgical History:  Past Surgical History:  Procedure Laterality Date  . APPENDECTOMY    . CARDIAC SURGERY    . I&D EXTREMITY  10/28/2011   Procedure: IRRIGATION AND DEBRIDEMENT EXTREMITY;  Surgeon: Tennis Must, MD;  Location: [place];  Service: Orthopedics;  Laterality: Right;  Irrigation and debridement right middle finger   HPI:  45 yo female adm to [facility] with leukocytosis and AMS 10/31/15.  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  Pt with h/o C2-C7 fusion and C3-C6 osteophytic changes per imaging study.  Pt resides with her caregiver mom per RN.  Hospital coarse complicated by pt requiring intubation for respiratory compromise, found to have occlusion of right bronchus with collapse of RM and RL lungs concerning for aspiration, left lung findings also concerning for aspiration.  Pt was placed on vent 10/12-10/17 - reintubated evening of 10/17 and trach placed 10/23.  Pt has been off the vent today full day and tolerating well.  Recent hospitalization for pna noted; pt coded and required defibrillation.  She also has h/o vent use for 3 months and trach *removed previously.  PMSV and swallow eval ordered.    Assessment / Plan / Recommendation Clinical Impression  Pt with much improved mentation compared to initial evaluation on 11/01/15.  Pt readily answered questions, followed directions and recalled information reviewed!    With PMSV in place, SlP provided pt with single ice chip boluses (hopefully to help  faciliate potential pharyngeal secretion stasis).  Swallow was timely with clear voice immediate postswallow.  Delayed wet voice noted that may indicate secretion and/or water aspiration.  Cues to clear throat effectively cleared voice.  In addition, pt able to "hock" and expectorate some pharyngeal secretions!!    Suspect pt's large bore feeding tube may be impacting epiglottic deflection given small stature of pt.  Swallow ability and mentation today is much improved clinically than on 10/12.    Given premorbid dysphagia, aspiration pna, intubation x2, and new trach tube possibly impacting pharyngeal/laryngeal sensation- recommend to proceed with MBS/potential esophagram.   As pt with potential ileus, would provide pt with minimal barium.  Anticipate pt will be able to consume po.    Recommend ice chips for comfort/secretion management with pmsv in place - cue to clear throat if pt gurgly vocalization present.  Using teach back, educated pt and she is agreeable to plan.  Pt states "I really want something by mouth".     Aspiration Risk  Moderate aspiration risk    Diet Recommendation Ice chips PRN after oral care   Medication Administration: Via alternative means Compensations: Slow rate;Small sips/bites (cue to clear throat if voice gurgly) Postural Changes: Seated upright at 90 degrees    Other  Recommendations Oral Care Recommendations: Oral care prior to ice chip/H20   Follow up Recommendations  (tbd)      Frequency and Duration            Prognosis Prognosis for Safe Diet Advancement: Good  Swallow Study   General Date of Onset: 11/01/15 HPI: 45 yo female adm to [facility] with leukocytosis and AMS 10/31/15.  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  Pt with h/o C2-C7 fusion and C3-C6 osteophytic changes per imaging study.  Pt resides with her caregiver mom per RN.  Hospital coarse complicated by pt requiring intubation for respiratory compromise, found to  have occlusion of right bronchus with collapse of RM and RL lungs concerning for aspiration, left lung findings also concerning for aspiration.  Pt was placed on vent 10/12-10/17 - reintubated evening of 10/17 and trach placed 10/23.  Pt has been off the vent today full day and tolerating well.  Recent hospitalization for pna noted; pt coded and required defibrillation.  She also has h/o vent use for 3 months and trach *removed previously.  PMSV and swallow eval ordered.  Type of Study: Bedside Swallow Evaluation Diet Prior to this Study: NPO;NG Tube Temperature Spikes Noted: Yes (low grade) Respiratory Status: Trach Collar History of Recent Intubation: No Behavior/Cognition: Alert;Cooperative Oral Cavity Assessment: Within Functional Limits Oral Care Completed by SLP: No Self-Feeding Abilities: Total assist Patient Positioning: Upright in bed Baseline Vocal Quality: Hoarse Volitional Cough: Weak Volitional Swallow: Able to elicit    Oral/Motor/Sensory Function Overall Oral Motor/Sensory Function: Within functional limits   Ice Chips Ice chips: Impaired Pharyngeal Phase Impairments: Wet Vocal Quality Other Comments: delayed wet vocal quality cleared with cued throat clear, uncertain if due to retained secretions mixing with melted ice or true laryngeal penetration of melted water   Thin Liquid Thin Liquid: Not tested    Nectar Thick Nectar Thick Liquid: Not tested   Honey Thick Honey Thick Liquid: Not tested   Puree Puree: Not tested   Solid   GO   Solid: Not tested        [name], [place] [facility] SLP [phone]

## 2015-11-15 NOTE — Progress Notes (Signed)
Patient complains of anxiety and dyspnea, asking for medications. Congested cough noted. Scattered rhonchi heard upon auscultation. Trach cuff inflated. Deep suction performed with saline lavage for small amount of thick white secretions. Airway patent. Inner canula changed. MD notified by RN. No new orders. Patient refuses the use of vent support at this time. RN remains at the bedside.  RT will continue to follow.

## 2015-11-15 NOTE — Progress Notes (Signed)
On initial shift assessment at 2000 pt appeared increasingly anxious and stated that she thought she was having a "panic attack." RR 25 and SpO2 95% on TC 28%. RN suctioned, repositioned and provided emotional support, gave scheduled effexor and ambien. Pt condition did not improve. Called RT (See RT note). Still no change in condition. Orders are to keep pt off vent all night if possible. Elink MD notified and RN requested medication for anxiety. No new orders. Told to monitor and place pt back on vent if necessary. Pt refusing ventilator at this time. At 2100 pt appears less anxious. Will continue to monitor pt closely.

## 2015-11-15 NOTE — Progress Notes (Signed)
Nutrition Follow-up  DOCUMENTATION CODES:   Non-severe (moderate) malnutrition in context of acute illness/injury, Underweight  INTERVENTION:  - Continue advancement of Vital 1.2; increase by 10 mL every 8 hours to reach goal rate of Vital 1.2 @ 65 mL/hr.  - RD will follow-up 10/27.  NUTRITION DIAGNOSIS:   Inadequate oral intake related to inability to eat as evidenced by NPO status. -ongoing  GOAL:   Patient will meet greater than or equal to 90% of their needs -unmet with TF note yet at goal rate.  MONITOR:   TF tolerance, Weight trends, Labs, Skin, I & O's  ASSESSMENT:   45 y.o. 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 Presented with worsening confusion status decreased by mouth intake foul-smelling decubitus ulcer. Apparently has been more sedated than usual at home; family did endorse that patient may have possibly received more narcotics than usual. Family also states that she has had decreased by mouth intake. Mother was very concerned that patient developed lung cancer based on the fact that her father died of it and want patient to be tested extensively. In ED patient required Narcan 2 and responded: suggestive of opioid overdose contributing to oversedation  10/26 Pt continues with trach collar and NGT and is currently receiving Vital 1.2 @ 35 mL/hr. This regimen is providing 1008 kcal, 63 grams of protein, and 681 mL free water. Pt denies abdominal pain or nausea this AM; she states that she is feeling hungry. SLP note from yesterday PM removed. No new CCM NP or MD note yet this AM.   RD will follow-up tomorrow. Continue to advance TF to goal: Vital 1.2 @ 65 mL/hr which will provide 1872 kcal, 117 grams of protein (95%  estimated protein need), and 1265 mL free water. Order in place for 100 mL free water TID but not currently programmed into the pump. Weight -1.3 kg from yesterday.   Medications reviewed; PRN Dulcolax, 100 mg Colace BID, sliding scale Novolog, PRN IV Zofran, 10 mEq IV KCl x6 runs yesterday, 40 mEq KCl per NGT x1 dose yesterday, 5 mL Senokot BID.  Labs reviewed; CBgs: 80-96 mg/dL, K: 3.3 mmol/L, creatinine: <0.3 mg/dL, Ca: 7.9 mg/dL.   10/25 - Pt now on trach collar.  - NGT remains in place and trickle rate TF initiated this AM per order: Vital High Protein @ 15 mL/hr.  - This regimen is providing 360 kcal, 32 grams of protein, and 301 mL free water.  - Estimated nutrition needs updated this AM as pt now off of ventilator support.  - Needs based on paraplegia as well as multiple severe wounds. - Weight -3.4 kg from yesterday.  - CCM NP note from this AM reviewed. Goal of MAP >60. IR consulted today for PEG; IR hopeful for ileus improving before placement of PEG. NP note indicates to advance TF to goal.  - Per MD/NP order, free water flush of 100 mL TID.  - Pt denies abdominal pain or nausea at time of RD visit.    10/24 - Trach procedure performed shortly before 22 yesterday.  - NGT now in place.  - Note from yesterday at 1710 reviewed concerning ileus with order for NGT to LIS.  - Per Dr. Anastasia Pall note this AM, plan for trach collar trial today, plan for Dulcolax today with continued  Colace and okay to start trickle TF tomorrow. -  Also per note, plan for PEG consult tomorrow.  - Reviewed CT abdomen results from yesterday which indicated worsening anasarca and moderate increasing ascites.   Patient is currently with trach on ventilator support MV: 5.1L/min Temp (24hrs), Avg:96.6 F (35.9 C), Min:95.9 F (35.5 C), Max:97 F (36.1 C) Drip: Precedex @ 0.4 mcg/kg/hr.    Diet Order:  Diet NPO time specified  Skin: Stage 4 pressure injuries to coccyx and bilateral buttocks,  Stage 1 pressure injuries to mid, upper, bilateral back and L shoulder, Unstageable wound to cervical area.     Last BM:  10/25  Height:   Ht Readings from Last 1 Encounters:  11/07/15 5\' 2"  (1.575 m)    Weight:   Wt Readings from Last 1 Encounters:  11/15/15 133 lb 6.1 oz (60.5 kg)    Ideal Body Weight:  47.5 kg  BMI:  Body mass index is 24.4 kg/m.  Estimated Nutritional Needs:   Kcal:  1850-1980 (30-32 kcal/kg)  Protein:  123 grams (2 grams/kg)  Fluid:  >/= 1.8 L/day  EDUCATION NEEDS:   No education needs identified at this time    Jarome Matin, MS, RD, LDN Inpatient Clinical Dietitian Pager # [phone] After hours/weekend pager # [phone]

## 2015-11-16 ENCOUNTER — Inpatient Hospital Stay (HOSPITAL_COMMUNITY): Payer: Medicaid Other

## 2015-11-16 LAB — GLUCOSE, CAPILLARY
GLUCOSE-CAPILLARY: 130 mg/dL — AB (ref 65–99)
GLUCOSE-CAPILLARY: 80 mg/dL (ref 65–99)
GLUCOSE-CAPILLARY: 89 mg/dL (ref 65–99)
GLUCOSE-CAPILLARY: 115 mg/dL — AB (ref 65–99)
Glucose-Capillary: 114 mg/dL — ABNORMAL HIGH (ref 65–99)
Glucose-Capillary: 103 mg/dL — ABNORMAL HIGH (ref 65–99)

## 2015-11-16 MED ORDER — CLONAZEPAM 0.1 MG/ML ORAL SUSPENSION
0.5000 mg | Freq: Two times a day (BID) | ORAL | Status: DC
  Filled 2015-11-16: qty 5

## 2015-11-16 MED ORDER — SENNOSIDES 8.8 MG/5ML PO SYRP
5.0000 mL | ORAL_SOLUTION | Freq: Every evening | ORAL | Status: DC | PRN
  Administered 2015-11-19: 5 mL
  Filled 2015-11-16 (×2): qty 5

## 2015-11-16 MED ORDER — CLONAZEPAM 0.5 MG PO TBDP
0.5000 mg | ORAL_TABLET | Freq: Two times a day (BID) | ORAL | Status: DC
  Administered 2015-11-16 – 2015-11-27 (×23): 0.5 mg via ORAL
  Filled 2015-11-16 (×23): qty 1

## 2015-11-16 MED ORDER — FENTANYL CITRATE (PF) 100 MCG/2ML IJ SOLN
50.0000 ug | Freq: Once | INTRAMUSCULAR | Status: AC
  Administered 2015-11-16: 50 ug via INTRAVENOUS
  Filled 2015-11-16: qty 2

## 2015-11-16 NOTE — Progress Notes (Signed)
PULMONARY / CRITICAL CARE MEDICINE   Name: Angela Page MRN: [id number] DOB: 02/14/1970    ADMISSION DATE:  10/31/2015 CONSULTATION DATE:  11/01/2015  REFERRING MD:  Dr. Doutova, [place]  CHIEF COMPLAINT:  Short of breath  SUBJECTIVE:   Off vent for 24 hours Has used speaking valve Wants to eat Panic attacks  VITAL SIGNS: BP 135/81   Pulse 94   Temp 97 F (36.1 C)   Resp 12   Ht 5' 2" (1.575 m)   Wt 130 lb 11.7 oz (59.3 kg)   SpO2 100%   BMI 23.91 kg/m   HEMODYNAMICS:    VENTILATOR SETTINGS: FiO2 (%):  [28 %] 28 %  INTAKE / OUTPUT: I/O last 3 completed shifts: In: 2045.8 [I.V.:6; NG/GT:2039.8] Out: 6115 [Urine:5905; Stool:210]  PHYSICAL EXAMINATION: General:  Chronically ill appearing female, on ATC, anxious HENT: Tracheostomy site clean, dry, no bleeding PULM: CTA B, few rhonchi, normal effort  CV: RRR, no mgr GI: non-distended, non-tender Derm: anasarca noted, thin skin Neuro: Awake, alert, following commands  LABS:  BMET  Recent Labs Lab 11/14/15 0518 11/14/15 1720 11/15/15 0359  NA 140 138 139  K 2.9* 4.1 3.3*  CL 101 102 102  CO2 32 30 31  BUN 12 10 8  CREATININE <0.30* <0.30* <0.30*  GLUCOSE 71 89 94    Electrolytes  Recent Labs Lab 11/12/15 0510  11/14/15 0518 11/14/15 1720 11/15/15 0359  CALCIUM 7.9*  < > 7.9* 8.0* 7.9*  MG 1.8  --   --   --   --   < > = values in this interval not displayed.  CBC  Recent Labs Lab 11/12/15 0510 11/13/15 0550 11/13/15 1003  WBC 11.4* 9.5 9.8  HGB 7.4* 7.0* 7.3*  HCT 22.7* 21.2* 22.2*  PLT 446* 452* 476*    Coag's  Recent Labs Lab 11/12/15 0510  INR 1.04    Sepsis Markers No results for input(s): LATICACIDVEN, PROCALCITON, O2SATVEN in the last 168 hours.  ABG No results for input(s): PHART, PCO2ART, PO2ART in the last 168 hours.  Liver Enzymes No results for input(s): AST, ALT, ALKPHOS, BILITOT, ALBUMIN in the last 168 hours.  Cardiac Enzymes No results for input(s):  TROPONINI, PROBNP in the last 168 hours.  Glucose  Recent Labs Lab 11/15/15 1115 11/15/15 1600 11/15/15 2030 11/15/15 2345 11/16/15 0313 11/16/15 0842  GLUCAP 123* 80 104* 104* 114* 103*    Imaging No results found.   STUDIES:  CT chest 10/12 >> occlusion of Rt bronchus intermedius, basilar consolidation, small Rt effusion Bronchoscopy 10/13 >> significant purulent secretions airways CT Pelvis 10/16 >> no evidence of emphysematous fasciitis along the perineum, stage IV decubitus ulcers along the sacrum / ischial tuberosities associated with chronic osteomyelitis CT abdomen 10/24 > ileus, anasarca  CULTURES: Blood 10/11 >> neg Urine 10/11 >> Acinetobacter, yeast Sputum 10/13 >> neg  ANTIBIOTICS: Vancomycin 10/11 >> 10/15 Levaquin 10/11 >> 10/12 Aztreonam 10/11 >> 10/12  Diflucan 10/12 >> 10/14 Primaxin 10/12 >> 10/22  SIGNIFICANT EVENTS: 10/11  Admit 10/13  VDRF, pressors, bronch 10/15  Vasopressors off  10/16  Fever, weaning on PSV 10/5 10/17  Extubated, failed and reintubated overnight 10/20  Weaning on PSV, plan for trach 10/23 10/23 Tracheostomy  LINES/TUBES: Lt femoral aline 10/13 >> 10/14 ETT 10/13 >> 10/17, 10/18 >>  Lt IJ CVL 10/13 >> 10/26  DISCUSSION: 45 yo female from home with lethargy and respiratory distress.  Found to have infected sacral wounds (present prior to this admission).    She has hx of C2-C7 posterior fusion in September 2015 after traumatic spinal cord injury with paraplegia (fell down stairs while intoxicated).  She developed progressive hypotension and respiratory failure from HCAP and a UTI leading to septic shock.   Improving overall, tolerating remaining off vent 24 hours as of 10/27.  Major barriers are nutrition: will she be able to keep up with po intake for full nutritional needs?  Had dysphagia prior to admission.    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  If she does well, then hold off on PEG Continue colace, prn senna If she cont to improve she may be able to have swallow eval  HEMATOLOGIC A:   Leukocytosis Anemia - suspect component of dilution + phlebotomy, no evidence of bleeding  P:  F/u CBC Lovenox for DVT prophylaxis Transfuse per ICU guidelines   INFECTIOUS A:   HCAP Decubitus ulcers - extensive wounds on sacrum, legs, back of head Acinetobacter + Yeast UTI  P:   Culture if febrile Wound care  ENDOCRINE A:   Hyperglycemia - iatrogenic, stress steroids P:   Monitor glucose on BMP Sensitive SSI while on stress steroids Stop stress dose steroids  NEUROLOGIC A:   C2-C7 traumatic spinal cord injury Anxiety Pain  P:   Add low dose scheduled clonazepam Continue effexor (in place of home cymbalta as unable to crush) Neurontin 300 mg TID Continue home oxycodone PRN  Continue home baclofen at reduced dose, 10 mg TID   Goals of care >> Palliative medicine met with family on 10/14, patient desires full code, Tracheostomy now placed.  Will likely need SNF, but patient refusing  Family:  Mother updated in person 10/23, see prior notes.    Brent , MD [place] [facility] PCCM Pager: [phone] Cell: [phone] After 3pm or if no  response, call [phone]     

## 2015-11-16 NOTE — Progress Notes (Addendum)
Modified Barium Swallow Progress Note  Patient Details  Name: Angela Page MRN: [id number] Date of Birth: 1970-03-08  Today's Date: 11/16/2015  Modified Barium Swallow completed.  Full report located under Chart Review in the Imaging Section.  Brief recommendations include the following:  Clinical Impression  Pt with only mild pharyngeal dysphagia without aspiration or deep penetration of any consistency tested.  Trace penetration of thin cleared within the same swallow.    She did NOT tolerate PMSV on for MBS due to quick complaint of difficulty breathing and overt breath stacking within 30 seconds.    Oral swallow was functional with only trace oral residuals with solids.  Pharyngeal swallow minimally weak with resultant vallecular residuals with solids which pt largely senses and clears with dry swallows.  Minimal liquid residuals noted at valleculae and pyriform sinus without consistent awareness.     Pt's largest aspiration risk unfortunately appears to be esophageal.    Appearance of poor clearance of esophagus noted and SLP requested Dr Rosana Hoes *radiologist* view pt's esophagus due to positioning difficulties for full esophagram with pt's quadriparesis.  Dr Rosana Hoes reported pt with "dysmotility and significant reflux" to proximal esophagus.  Dedicated esophageal evaluation not conducted as per Dr Rosana Hoes, further information would not be obtained through esophagram.    Of note, pt also became fatigued from testing, causing concern for ability to meet nutrition regardless of esophageal concerns.    Skilled intervention included educating pt to findings using live video and reinforcing understanding.  SLP to follow.    Swallow Evaluation Recommendations       SLP Diet Recommendations: Ice chips PRN after oral care       Medication Administration: Via alternative means               Oral Care Recommendations: Oral care QID        Janett Labella [place], [place]  [facility] SLP [phone]

## 2015-11-16 NOTE — Progress Notes (Signed)
Date:  November 16, 2015 Spoke with the mother [name], discussed the possibility of the patient coming home in light that the patient does not wish to go to a snf.  Mother states that she has been caring for the patient for the last two years.   Did discuss the possibility of patient coming home with trach and on the vent.  Nursing care has been through [facility] in the past and the mother has been in touch with this agency.  Deb. Verburg is the contact and is aware of the patient.  Mother feels as though she would be able to care for the patient at home with [facility] maxium support. Velva Harman, BSN, [place], [place]

## 2015-11-16 NOTE — Progress Notes (Signed)
Date:  November 16, 2015 Spoke with Ruffin Pyo at [place] made aware that patient and mother do want patient to go home. Velva Harman, BSN, [place], [place]

## 2015-11-16 NOTE — Progress Notes (Signed)
Nutrition Follow-up  DOCUMENTATION CODES:   Non-severe (moderate) malnutrition in context of acute illness/injury, Underweight  INTERVENTION:  - Continue Vital 1.2 @ 65 mL/hr which provides 1872 kcal, 117 grams of protein (95% estimated protein need), and 1265 mL free water.  - Will start PEPuP protocol with TF now at goal rate. - Diet advancement as feasible. - RD will follow-up 10/30.  NUTRITION DIAGNOSIS:   Inadequate oral intake related to inability to eat as evidenced by NPO status. -ongoing  GOAL:   Patient will meet greater than or equal to 90% of their needs -met with TF now at goal rate.  MONITOR:   TF tolerance, Weight trends, Labs, Skin, I & O's, Other (Comment) (Ability to advance diet)  ASSESSMENT:   45 y.o. 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 Presented with worsening confusion status decreased by mouth intake foul-smelling decubitus ulcer. Apparently has been more sedated than usual at home; family did endorse that patient may have possibly received more narcotics than usual. Family also states that she has had decreased by mouth intake. Mother was very concerned that patient developed lung cancer based on the fact that her father died of it and want patient to be tested extensively. In ED patient required Narcan 2 and responded: suggestive of opioid overdose contributing to oversedation  10/27 Pt sleeping at this time with no family/visitors present. Pt continues with trach collar and SLP worked with pt with PMV yesterday. SLP also trialed POs yesterday and recommended ice chips only. She is currently receiving TF at goal rate via NGT: Vital 1.2 @ 65 mL/hr. Free water flush remains ordered but not programmed  into pump at this time.   Per Dr. Anastasia Pall note this AM, hopeful for diet advancement, calorie count with diet advancement to assess if PEG can be avoided. Will follow-up Monday to monitor route of nutrition and associated needs at that time. Weight -1.2 kg from yesterday. Will re-estimate nutrition needs Monday if weight continues to trend down.   Medications reviewed; PRN Dulcolax, 100 mg Colace BID, sliding scale Novolog, PRN IV Zofran, 40 mEq KCl every 4 hours via NGT, PRN Senokot. Labs reviewed; CBGs: 103 and 130 mg/dL today, K: 3.3 mmol/L, Ca: 7.9 mg/dL, creatinine: <0.3 mg/dL.   10/26 - Pt continues with trach collar and NGT and is currently receiving Vital 1.2 @ 35 mL/hr.  - This regimen is providing 1008 kcal, 63 grams of protein, and 681 mL free water.  - Pt denies abdominal pain or nausea this AM; she states that she is feeling hungry.  - Continue to advance TF to goal: Vital 1.2 @ 65 mL/hr which will provide 1872 kcal, 117 grams of protein (95% estimated protein need), and 1265 mL free water.  - Order in place for 100 mL free water TID but not currently programmed into the pump.  - Weight -1.3 kg from yesterday.    10/25 - Pt now on trach collar.  - NGT remains in place and trickle rate TF initiated this AM per order: Vital High Protein @ 15 mL/hr.  - This regimen is providing 360 kcal, 32 grams of protein, and 301 mL free water.  - Estimated nutrition needs updated this AM as pt now off of ventilator support.  - Needs based on paraplegia as well as multiple  severe wounds. - Weight -3.4 kg from yesterday.  - CCM NP note from this AM reviewed. Goal of MAP >60. IR consulted today for PEG; IR hopeful for ileus improving before placement of PEG. NP note indicates to advance TF to goal.  - Per MD/NP order, free water flush of 100 mL TID.  - Pt denies abdominal pain or nausea at time of RD visit.    Diet Order:  Diet NPO time specified  Skin:    Stage 4 pressure injuries to  coccyx and bilateral buttocks, Stage 1 pressure injuries to mid, upper, bilateral back and L shoulder, Unstageable wound to cervical area.   Last BM:  10/27  Height:   Ht Readings from Last 1 Encounters:  11/07/15 _0  (1.575 m)    Weight:   Wt Readings from Last 1 Encounters:  11/16/15 130 lb 11.7 oz (59.3 kg)    Ideal Body Weight:  47.5 kg  BMI:  Body mass index is 23.91 kg/m.  Estimated Nutritional Needs:   Kcal:  1850-1980 (30-32 kcal/kg)  Protein:  123 grams (2 grams/kg)  Fluid:  >/= 1.8 L/day  EDUCATION NEEDS:   No education needs identified at this time    Jarome Matin, MS, RD, LDN Inpatient Clinical Dietitian Pager # [phone] After hours/weekend pager # [phone]

## 2015-11-17 DIAGNOSIS — R1319 Other dysphagia: Secondary | ICD-10-CM

## 2015-11-17 DIAGNOSIS — R1310 Dysphagia, unspecified: Secondary | ICD-10-CM

## 2015-11-17 LAB — GLUCOSE, CAPILLARY
GLUCOSE-CAPILLARY: 117 mg/dL — AB (ref 65–99)
GLUCOSE-CAPILLARY: 103 mg/dL — AB (ref 65–99)
Glucose-Capillary: 138 mg/dL — ABNORMAL HIGH (ref 65–99)
Glucose-Capillary: 122 mg/dL — ABNORMAL HIGH (ref 65–99)

## 2015-11-17 LAB — CBC
HEMATOCRIT: 29 % — AB (ref 36.0–46.0)
HEMOGLOBIN: 9.4 g/dL — AB (ref 12.0–15.0)
MCH: 30.9 pg (ref 26.0–34.0)
MCHC: 32.4 g/dL (ref 30.0–36.0)
MCV: 95.4 fL (ref 78.0–100.0)
Platelets: 485 10*3/uL — ABNORMAL HIGH (ref 150–400)
RBC: 3.04 MIL/uL — ABNORMAL LOW (ref 3.87–5.11)
RDW: 22.6 % — AB (ref 11.5–15.5)
WBC: 12.8 10*3/uL — ABNORMAL HIGH (ref 4.0–10.5)

## 2015-11-17 LAB — MAGNESIUM: Magnesium: 2.2 mg/dL (ref 1.7–2.4)

## 2015-11-17 LAB — BASIC METABOLIC PANEL
Anion gap: 7 (ref 5–15)
BUN: 9 mg/dL (ref 6–20)
CALCIUM: 8.5 mg/dL — AB (ref 8.9–10.3)
CHLORIDE: 103 mmol/L (ref 101–111)
CO2: 28 mmol/L (ref 22–32)
GLUCOSE: 117 mg/dL — AB (ref 65–99)
Potassium: 3.8 mmol/L (ref 3.5–5.1)
Sodium: 138 mmol/L (ref 135–145)

## 2015-11-17 MED ORDER — MORPHINE SULFATE (PF) 2 MG/ML IV SOLN
1.0000 mg | INTRAVENOUS | Status: DC | PRN
  Administered 2015-11-17 – 2015-11-27 (×35): 1 mg via INTRAVENOUS
  Filled 2015-11-17 (×37): qty 1

## 2015-11-17 NOTE — Progress Notes (Signed)
PROGRESS NOTE    Angela Page  GTX:[id number] DOB: 1970/03/29 DOA: 10/31/2015 PCP: PROVIDER NOT IN SYSTEM   Brief Narrative: 45 y.o. female with medical history significant of traumatic spinal injury with paraplegia s/p traumatic fall with C2-7 posterior fusion 10/08/13 , recurrent UTI, decubitus ulcer, malnutrition, recurrent sepsis, Sick sinus syndrome status post Medtronic dual Chamber Pacemaker placement 11/03/13, presented with worsening confusion, decreased by mouth intake, foul-smelling decubitus ulcer.  She developed progressive hypotension and respiratory failure from HCAP and a UTI leading to septic shock. Completed antibiotics course. Patient is extubated. Evaluation for nutrition ongoing. Patient's care is transferred to me today, on 17th day of inpatient hospitalization. I reviewed prior medical record.    Assessment & Plan:   #Acute hypoxic/hypercapnic respiratory failure secondary to HCAP, sepsis: Improved. Patient completed antibiotics course as per medical record. Successfully extubated and currently on trach with high flow oxygen. Pulmonary evaluation and treatment ongoing.  # Protein-calorie malnutrition, severe ([place]): Speech and swallow evaluation note reviewed. The largest aspiration risk appeared to be esophageal. Recommended only ice chips. I discussed this with the patient today, she wants to repeat the test on Monday and see if she can have oral intake. Only after that she wants to discuss about gastric tube for the fitting. -For now continue to feed from the NG tube. Dietary consult appreciated.  #Septic shock results now. 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  Other hospital problems list:     Quadriplegia, C5-C7 incomplete [facility]): Continue baclofen and supportive care   Severe sepsis ([place])   Decubitus ulcer of sacral region, stage 4 (HCC)   Lower urinary tract infectious disease   Acute encephalopathy   History of asthma and tobacco  abuse   Ileus: Improved   Hyperglycemia due to steroid  Continue current medical and supportive care.  DVT prophylaxis: Lovenox subcutaneous Code Status: Full code Family Communication: No family present at bedside Disposition Plan: Currently at stepdown. Likely discharge home versus nursing home in 2-3 days.    Consultants:   Pulmonary critical care.  Procedures: Lt femoral aline 10/13 >> 10/14 ETT 10/13 >> 10/17, 10/18 >>  Lt IJ CVL 10/13 >> 10/26  SIGNIFICANT EVENTS: 10/11  Admit 10/13  VDRF, pressors, bronch 10/15  Vasopressors off  10/16  Fever, weaning on PSV 10/5 10/17  Extubated, failed and reintubated overnight 10/20  Weaning on PSV, plan for trach 10/23 10/23 Tracheostomy  Antimicrobials: Vancomycin 10/11 >> 10/15 Levaquin 10/11 >> 10/12 Aztreonam 10/11 >> 10/12  Diflucan 10/12 >> 10/14 Primaxin 10/12 >> 10/22  STUDIES:  CT chest 10/12 >> occlusion of Rt bronchus intermedius, basilar consolidation, small Rt effusion Bronchoscopy 10/13 >> significant purulent secretions airways CT Pelvis 10/16 >> no evidence of emphysematous fasciitis along the perineum, stage IV decubitus ulcers along the sacrum / ischial tuberosities associated with chronic osteomyelitis CT abdomen 10/24 > ileus, anasarca  CULTURES: Blood 10/11 >> neg Urine 10/11 >> Acinetobacter, yeast Sputum 10/13 >> neg  Subjective: patient was seen and examined at stepdown unit. Patient is on high flow oxygen via trach. She was alert awake and talking. She wants to have another swallow evaluation on Monday. Reported chronic back pain. Denied nausea vomiting or abdominal pain. Denied chest pain, shortness of breath.   Objective: Vitals:   11/17/15 0600 11/17/15 0805 11/17/15 1142 11/17/15 1200  BP: 114/74  125/77 (!) 98/58  Pulse:  86 86   Resp: '14 16 14 14  '$ Temp: 97.5 F (36.4 C)   (!)  96.8 F (36 C)  TempSrc:    Core (Comment)  SpO2: 97% 98% 100% 97%  Weight:      Height:         Intake/Output Summary (Last 24 hours) at 11/17/15 1325 Last data filed at 11/17/15 1027  Gross per 24 hour  Intake             1048 ml  Output             3705 ml  Net            -2657 ml   Filed Weights   11/15/15 0500 11/16/15 0500 11/17/15 0500  Weight: 60.5 kg (133 lb 6.1 oz) 59.3 kg (130 lb 11.7 oz) 47.9 kg (105 lb 9.6 oz)    Examination:  General exam: Ill-looking female. NG tube for feeding Neck: Tracheostomy site clean, no bleeding. Respiratory system:  Coarse breath sound bilateral, respiratory effort normal. No wheezing appreciated. Cardiovascular system: S1 & S2 heard, RRR. Trace pedal edema. Gastrointestinal system:  Abdomen soft, nontender and nondistended. Bowel sound positive Central nervous system: Alert, awake and following commands. Extremities: paraplegia Psychiatry: Judgement and insight appear normal. Mood & affect appropriate.     Data Reviewed: I have personally reviewed following labs and imaging studies  CBC:  Recent Labs Lab 11/12/15 0510 11/13/15 0550 11/13/15 1003 11/17/15 0826  WBC 11.4* 9.5 9.8 12.8*  NEUTROABS  --   --  8.1*  --   HGB 7.4* 7.0* 7.3* 9.4*  HCT 22.7* 21.2* 22.2* 29.0*  MCV 91.2 91.0 90.6 95.4  PLT 446* 452* 476* 599*   Basic Metabolic Panel:  Recent Labs Lab 11/12/15 0510 11/13/15 0525 11/14/15 0518 11/14/15 1720 11/15/15 0359 11/17/15 0826  NA 142 140 140 138 139 138  K 3.0* 3.5 2.9* 4.1 3.3* 3.8  CL 107 104 101 102 102 103  CO2 30 31 32 '30 31 28  '$ GLUCOSE 116* 80 71 89 94 117*  BUN '19 14 12 10 8 9  '$ CREATININE <0.30* <0.30* <0.30* <0.30* <0.30* <0.30*  CALCIUM 7.9* 8.0* 7.9* 8.0* 7.9* 8.5*  MG 1.8  --   --   --   --  2.2   GFR: CrCl cannot be calculated (This lab value cannot be used to calculate CrCl because it is not a number: <0.30). Liver Function Tests: No results for input(s): AST, ALT, ALKPHOS, BILITOT, PROT, ALBUMIN in the last 168 hours. No results for input(s): LIPASE, AMYLASE in the last  168 hours. No results for input(s): AMMONIA in the last 168 hours. Coagulation Profile:  Recent Labs Lab 11/12/15 0510  INR 1.04   Cardiac Enzymes: No results for input(s): CKTOTAL, CKMB, CKMBINDEX, TROPONINI in the last 168 hours. BNP (last 3 results) No results for input(s): PROBNP in the last 8760 hours. HbA1C: No results for input(s): HGBA1C in the last 72 hours. CBG:  Recent Labs Lab 11/16/15 2039 11/16/15 2328 11/17/15 0349 11/17/15 0823 11/17/15 1239  GLUCAP 89 115* 138* 122* 117*   Lipid Profile: No results for input(s): CHOL, HDL, LDLCALC, TRIG, CHOLHDL, LDLDIRECT in the last 72 hours. Thyroid Function Tests: No results for input(s): TSH, T4TOTAL, FREET4, T3FREE, THYROIDAB in the last 72 hours. Anemia Panel: No results for input(s): VITAMINB12, FOLATE, FERRITIN, TIBC, IRON, RETICCTPCT in the last 72 hours. Sepsis Labs: No results for input(s): PROCALCITON, LATICACIDVEN in the last 168 hours.  No results found for this or any previous visit (from the past 240 hour(s)).  Radiology Studies: Dg Abd 1 View  Result Date: 11/16/2015 CLINICAL DATA:  NG tube placement EXAM: ABDOMEN - 1 VIEW COMPARISON:  CT abdomen pelvis 46/27/0350 FINDINGS: Metallic tip of the nasogastric tube overlies the pylorus. Contrast material instilled through the tube is seen at the gastroduodenal junction extending into the small bowel. IMPRESSION: Nasogastric tube tip overlying the pylorus. Electronically Signed   By: Ulyses Jarred M.D.   On: 11/16/2015 17:46   Dg Swallowing Func-speech Pathology  Result Date: 11/16/2015 Objective Swallowing Evaluation: Type of Study: MBS-Modified Barium Swallow Study Patient Details Name: Clint Strupp MRN: [id number] Date of Birth: Mar 30, 1970 Today's Date: 11/16/2015 Time: SLP Start Time (ACUTE ONLY): 1505-SLP Stop Time (ACUTE ONLY): 1537 SLP Time Calculation (min) (ACUTE ONLY): 32 min Past Medical History: Past Medical History: Diagnosis Date .  Asthma  . GERD (gastroesophageal reflux disease)  . Pacemaker  . Pleurisy  . Quadriparesis [facility])  Past Surgical History: Past Surgical History: Procedure Laterality Date . APPENDECTOMY   . CARDIAC SURGERY   . I&D EXTREMITY  10/28/2011  Procedure: IRRIGATION AND DEBRIDEMENT EXTREMITY;  Surgeon: Tennis Must, MD;  Location: [place];  Service: Orthopedics;  Laterality: Right;  Irrigation and debridement right middle finger HPI: 45 yo female adm to [facility] with leukocytosis and AMS 10/31/15.  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  Pt with h/o C2-C7 fusion and C3-C6 osteophytic changes per imaging study.  Pt resides with her caregiver mom per RN.  Hospital coarse complicated by pt requiring intubation for respiratory compromise, found to have occlusion of right bronchus with collapse of RM and RL lungs concerning for aspiration, left lung findings also concerning for aspiration.  Pt was placed on vent 10/12-10/17 - reintubated evening of 10/17 and trach placed 10/23.  Pt has been off the vent today full day and tolerating well.  Recent hospitalization for pna noted; pt coded and required defibrillation.  She also has h/o vent use for 3 months and trach *removed previously.  PMSV and swallow eval ordered.  Subjective: pt alert in chair, feeding tube removed for testing Assessment / Plan / Recommendation CHL IP CLINICAL IMPRESSIONS 11/16/2015 Therapy Diagnosis Suspected primary esophageal dysphagia;Mild pharyngeal phase dysphagia Clinical Impression Pt with only mild pharyngeal dysphagia without aspiration or deep penetration of any consistency tested.  Trace penetration of thin cleared within the same swallow.  She did NOT tolerate PMSV on for MBS due to quick complaint of difficulty breathing and overt breath stacking within 30 seconds.  Oral swallow was functional with only trace oral residuals with solids.  Pharyngeal swallow minimally weak with resultant vallecular residuals with  solids which pt largely senses and clears with dry swallows.  Minimal liquid residuals noted at valleculae and pyriform sinus without consistent awareness.     Pt's largest aspiration risk unfortunately appears to be esophageal.  Appearance of poor clearance of esophagus noted and SLP requested Dr Rosana Hoes *radiologist* view pt's esophagus due to positioning difficulties for full esophagram with pt's quadriparesis.  Dr Rosana Hoes reported pt with "dysmotility and significant reflux" to proximal esophagus.  Dedicated esophageal evaluation not conducted as per Dr Rosana Hoes, further information would not be obtained through esophagram.  Of note, pt also became fatigued from testing, causing concern for ability to meet nutrition regardless of esophageal concerns.  Skilled intervention included educating pt to findings using live video and reinforcing understanding.  SLP to follow.  Impact on safety and function Severe aspiration risk;Risk for inadequate nutrition/hydration  CHL IP TREATMENT RECOMMENDATION 11/16/2015 Treatment Recommendations Therapy as outlined in treatment plan below   Prognosis 11/16/2015 Prognosis for Safe Diet Advancement Guarded Barriers to Reach Goals Severity of deficits Barriers/Prognosis Comment -- CHL IP DIET RECOMMENDATION 11/16/2015 SLP Diet Recommendations Ice chips PRN after oral care Liquid Administration via -- Medication Administration Via alternative means Compensations -- Postural Changes --   CHL IP OTHER RECOMMENDATIONS 11/16/2015 Recommended Consults -- Oral Care Recommendations Oral care QID Other Recommendations --   CHL IP FOLLOW UP RECOMMENDATIONS 11/15/2015 Follow up Recommendations (No Data)   CHL IP FREQUENCY AND DURATION 11/16/2015 Speech Therapy Frequency (ACUTE ONLY) min 2x/week Treatment Duration 2 weeks      CHL IP ORAL PHASE 11/16/2015 Oral Phase Impaired Oral - Pudding Teaspoon -- Oral - Pudding Cup -- Oral - Honey Teaspoon -- Oral - Honey Cup -- Oral - Nectar Teaspoon -- Oral -  Nectar Cup -- Oral - Nectar Straw WFL Oral - Thin Teaspoon WFL Oral - Thin Cup WFL Oral - Thin Straw WFL Oral - Puree WFL Oral - Mech Soft -- Oral - Regular WFL Oral - Multi-Consistency -- Oral - Pill -- Oral Phase - Comment --  CHL IP PHARYNGEAL PHASE 11/16/2015 Pharyngeal Phase Impaired Pharyngeal- Pudding Teaspoon -- Pharyngeal -- Pharyngeal- Pudding Cup -- Pharyngeal -- Pharyngeal- Honey Teaspoon -- Pharyngeal -- Pharyngeal- Honey Cup -- Pharyngeal -- Pharyngeal- Nectar Teaspoon -- Pharyngeal -- Pharyngeal- Nectar Cup -- Pharyngeal -- Pharyngeal- Nectar Straw Pharyngeal residue - valleculae;Pharyngeal residue - pyriform Pharyngeal -- Pharyngeal- Thin Teaspoon Pharyngeal residue - valleculae;Pharyngeal residue - pyriform Pharyngeal -- Pharyngeal- Thin Cup Pharyngeal residue - valleculae;Pharyngeal residue - pyriform Pharyngeal -- Pharyngeal- Thin Straw Penetration/Aspiration during swallow Pharyngeal Material enters airway, remains ABOVE vocal cords then ejected out Pharyngeal- Puree Pharyngeal residue - valleculae Pharyngeal -- Pharyngeal- Mechanical Soft -- Pharyngeal -- Pharyngeal- Regular Pharyngeal residue - valleculae Pharyngeal -- Pharyngeal- Multi-consistency -- Pharyngeal -- Pharyngeal- Pill -- Pharyngeal -- Pharyngeal Comment pt largely sensed mild pharyngeal residuals and dry swallows faciliate clearance  CHL IP CERVICAL ESOPHAGEAL PHASE 11/16/2015 Cervical Esophageal Phase Impaired Pudding Teaspoon -- Pudding Cup -- Honey Teaspoon -- Honey Cup -- Nectar Teaspoon -- Nectar Cup -- Nectar Straw -- Thin Teaspoon -- Thin Cup -- Thin Straw -- Puree -- Mechanical Soft -- Regular -- Multi-consistency -- Pill -- Cervical Esophageal Comment appearance of poor clearance of esophagus, SLP requested Dr Rosana Hoes *radiologist* view pt's esophagus due to positioning difficulties for esophagram with her quadriparesis; Dr Rosana Hoes reported pt with dysmotility and significant reflux to proximal esophagus; dedicated esophageal  evaluation not conducted as per Dr Rosana Hoes, further information would not be obtained through esophagram No flowsheet data found. Janett Labella [place], [place] [facility] SLP [phone]                   Scheduled Meds: . baclofen  10 mg Oral TID  . chlorhexidine gluconate (MEDLINE KIT)  15 mL Mouth Rinse BID  . clonazepam  0.5 mg Oral BID  . collagenase   Topical Daily  . docusate  100 mg Oral BID  . enoxaparin (LOVENOX) injection  40 mg Subcutaneous QHS  . feeding supplement (VITAL AF 1.2 CAL)  1,000 mL Per Tube Q24H  . free water  100 mL Per Tube Q8H  . gabapentin  300 mg Per Tube TID  . insulin aspart  0-9 Units Subcutaneous Q4H  . ipratropium-albuterol  3 mL Nebulization Q6H  . mouth rinse  15 mL Mouth Rinse QID  .  midodrine  10 mg Oral TID WC  . ranitidine  150 mg Per Tube Q12H  . sodium chloride flush  3 mL Intravenous Q12H  . venlafaxine  75 mg Per Tube BID  . zolpidem  5 mg Oral QHS   Continuous Infusions:    LOS: 17 days    Time spent: 35 minutes.    Dron Tanna Furry, MD [facility] Pager [phone]  If 7PM-7AM, please contact night-coverage [email or web address] Password [email or web address] 11/17/2015, 1:25 PM

## 2015-11-18 LAB — BASIC METABOLIC PANEL
Anion gap: 6 (ref 5–15)
BUN: 12 mg/dL (ref 6–20)
CO2: 31 mmol/L (ref 22–32)
Calcium: 8.9 mg/dL (ref 8.9–10.3)
Chloride: 103 mmol/L (ref 101–111)
Creatinine, Ser: <0.3 mg/dL — ABNORMAL LOW (ref 0.44–1.00)
Glucose, Bld: 116 mg/dL — ABNORMAL HIGH (ref 65–99)
Potassium: 3.6 mmol/L (ref 3.5–5.1)
Sodium: 140 mmol/L (ref 135–145)

## 2015-11-18 LAB — GLUCOSE, CAPILLARY
GLUCOSE-CAPILLARY: 124 mg/dL — AB (ref 65–99)
GLUCOSE-CAPILLARY: 124 mg/dL — AB (ref 65–99)
GLUCOSE-CAPILLARY: 134 mg/dL — AB (ref 65–99)
Glucose-Capillary: 141 mg/dL — ABNORMAL HIGH (ref 65–99)
Glucose-Capillary: 107 mg/dL — ABNORMAL HIGH (ref 65–99)
Glucose-Capillary: 102 mg/dL — ABNORMAL HIGH (ref 65–99)
Glucose-Capillary: 121 mg/dL — ABNORMAL HIGH (ref 65–99)

## 2015-11-18 NOTE — Progress Notes (Signed)
PROGRESS NOTE    Angela Page  HYI:[id number] DOB: 05/22/1970 DOA: 10/31/2015 PCP: PROVIDER NOT IN SYSTEM   Brief Narrative: 45 y.o. female with medical history significant of traumatic spinal injury with paraplegia s/p traumatic fall with C2-7 posterior fusion 10/08/13 , recurrent UTI, decubitus ulcer, malnutrition, recurrent sepsis, Sick sinus syndrome status post Medtronic dual Chamber Pacemaker placement 11/03/13, presented with worsening confusion, decreased by mouth intake, foul-smelling decubitus ulcer.  She developed progressive hypotension and respiratory failure from HCAP and a UTI leading to septic shock. Completed antibiotics course. Patient is extubated. Evaluation for nutrition ongoing. CCM transferred to [facility] on 11/17/2015, on 17th days of inpatient hospitalization. Assessment & Plan:   #Acute hypoxic/hypercapnic respiratory failure secondary to HCAP, sepsis: Improved. Patient completed antibiotics course as per medical record. Successfully extubated and currently on trach with high flow oxygen. Pulmonary evaluation and treatment ongoing. -Continue supportive care. Denies any event.  # Protein-calorie malnutrition, severe ([place]): Speech and swallow evaluation note reviewed. The largest aspiration risk appeared to be esophageal. Recommended only ice chips. I discussed this with the patient yesterday and today, she wants to repeat the test on Monday and see if she can have oral intake. Only after that she wants to discuss about gastric tube for the fitting. -For now continue to feed from the NG tube. Dietary consult appreciated.  #Septic shock results now. 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  Other hospital problems list:     Quadriplegia, C5-C7 incomplete [facility]): Continue baclofen and supportive care   Severe sepsis ([place])   Decubitus ulcer of sacral region, stage 4 (HCC)   Lower urinary tract infectious disease   Acute encephalopathy   History of  asthma and tobacco abuse   Ileus: Improved   Hyperglycemia due to steroid  Continue current medical and supportive care. Dressing change, monitoring blood pressure, blood sugar level.  DVT prophylaxis: Lovenox subcutaneous Code Status: Full code Family Communication: No family present at bedside Disposition Plan: Currently at stepdown. Likely discharge home versus nursing home in 2-3 days.    Consultants:   Pulmonary critical care.  Procedures: Lt femoral aline 10/13 >> 10/14 ETT 10/13 >> 10/17, 10/18 >>  Lt IJ CVL 10/13 >> 10/26  SIGNIFICANT EVENTS: 10/11  Admit 10/13  VDRF, pressors, bronch 10/15  Vasopressors off  10/16  Fever, weaning on PSV 10/5 10/17  Extubated, failed and reintubated overnight 10/20  Weaning on PSV, plan for trach 10/23 10/23 Tracheostomy  Antimicrobials: Vancomycin 10/11 >> 10/15 Levaquin 10/11 >> 10/12 Aztreonam 10/11 >> 10/12  Diflucan 10/12 >> 10/14 Primaxin 10/12 >> 10/22  STUDIES:  CT chest 10/12 >> occlusion of Rt bronchus intermedius, basilar consolidation, small Rt effusion Bronchoscopy 10/13 >> significant purulent secretions airways CT Pelvis 10/16 >> no evidence of emphysematous fasciitis along the perineum, stage IV decubitus ulcers along the sacrum / ischial tuberosities associated with chronic osteomyelitis CT abdomen 10/24 > ileus, anasarca  CULTURES: Blood 10/11 >> neg Urine 10/11 >> Acinetobacter, yeast Sputum 10/13 >> neg  Subjective: patient was seen and examined at stepdown unit. Patient is still on high flow oxygen. Recent reported weakness and fatigue. She wants to reevaluate for swallowing and then think about if she really needs gastric tube placement. Denies nausea, vomiting, abdominal pain. No chest pain or shortness of breath.   Objective: Vitals:   11/18/15 0816 11/18/15 0900 11/18/15 1000 11/18/15 1100  BP:  135/89    Pulse: 89     Resp: 17 16  Temp:  98.2 F (36.8 C) 98.4 F (36.9 C) 98.4 F (36.9  C)  TempSrc:      SpO2: 100% 100%    Weight:      Height:        Intake/Output Summary (Last 24 hours) at 11/18/15 1117 Last data filed at 11/18/15 0900  Gross per 24 hour  Intake             1050 ml  Output             4000 ml  Net            -2950 ml   Filed Weights   11/16/15 0500 11/17/15 0500 11/18/15 0400  Weight: 59.3 kg (130 lb 11.7 oz) 47.9 kg (105 lb 9.6 oz) 48 kg (105 lb 13.1 oz)    Examination:  General exam: Ill-looking female has NG tube for feeding Neck: Tracheostomy with high flow oxygen, site clean no bleeding Respiratory system:  Coarse breath sound bilaterally, respiratory effort normal. Cardiovascular system: Regular rate rhythm, S1 and S2 normal. Trace pedal edema Gastrointestinal system:  Abdomen soft, nontender, nondistended. Bowel sound positive. Rectal the present Central nervous system: Alert, awake and following commands Extremities: paraplegia Psychiatry: Judgement and insight appear normal. Mood & affect appropriate.     Data Reviewed: I have personally reviewed following labs and imaging studies  CBC:  Recent Labs Lab 11/12/15 0510 11/13/15 0550 11/13/15 1003 11/17/15 0826  WBC 11.4* 9.5 9.8 12.8*  NEUTROABS  --   --  8.1*  --   HGB 7.4* 7.0* 7.3* 9.4*  HCT 22.7* 21.2* 22.2* 29.0*  MCV 91.2 91.0 90.6 95.4  PLT 446* 452* 476* 694*   Basic Metabolic Panel:  Recent Labs Lab 11/12/15 0510 11/13/15 0525 11/14/15 0518 11/14/15 1720 11/15/15 0359 11/17/15 0826  NA 142 140 140 138 139 138  K 3.0* 3.5 2.9* 4.1 3.3* 3.8  CL 107 104 101 102 102 103  CO2 30 31 32 '30 31 28  '$ GLUCOSE 116* 80 71 89 94 117*  BUN '19 14 12 10 8 9  '$ CREATININE <0.30* <0.30* <0.30* <0.30* <0.30* <0.30*  CALCIUM 7.9* 8.0* 7.9* 8.0* 7.9* 8.5*  MG 1.8  --   --   --   --  2.2   GFR: CrCl cannot be calculated (This lab value cannot be used to calculate CrCl because it is not a number: <0.30). Liver Function Tests: No results for input(s): AST, ALT, ALKPHOS,  BILITOT, PROT, ALBUMIN in the last 168 hours. No results for input(s): LIPASE, AMYLASE in the last 168 hours. No results for input(s): AMMONIA in the last 168 hours. Coagulation Profile:  Recent Labs Lab 11/12/15 0510  INR 1.04   Cardiac Enzymes: No results for input(s): CKTOTAL, CKMB, CKMBINDEX, TROPONINI in the last 168 hours. BNP (last 3 results) No results for input(s): PROBNP in the last 8760 hours. HbA1C: No results for input(s): HGBA1C in the last 72 hours. CBG:  Recent Labs Lab 11/17/15 1239 11/17/15 2044 11/18/15 0103 11/18/15 0351 11/18/15 0750  GLUCAP 117* 103* 124* 141* 107*   Lipid Profile: No results for input(s): CHOL, HDL, LDLCALC, TRIG, CHOLHDL, LDLDIRECT in the last 72 hours. Thyroid Function Tests: No results for input(s): TSH, T4TOTAL, FREET4, T3FREE, THYROIDAB in the last 72 hours. Anemia Panel: No results for input(s): VITAMINB12, FOLATE, FERRITIN, TIBC, IRON, RETICCTPCT in the last 72 hours. Sepsis Labs: No results for input(s): PROCALCITON, LATICACIDVEN in the last 168 hours.  No results found for  this or any previous visit (from the past 240 hour(s)).       Radiology Studies: Dg Abd 1 View  Result Date: 11/16/2015 CLINICAL DATA:  NG tube placement EXAM: ABDOMEN - 1 VIEW COMPARISON:  CT abdomen pelvis 82/50/5397 FINDINGS: Metallic tip of the nasogastric tube overlies the pylorus. Contrast material instilled through the tube is seen at the gastroduodenal junction extending into the small bowel. IMPRESSION: Nasogastric tube tip overlying the pylorus. Electronically Signed   By: Ulyses Jarred M.D.   On: 11/16/2015 17:46   Dg Swallowing Func-speech Pathology  Result Date: 11/16/2015 Objective Swallowing Evaluation: Type of Study: MBS-Modified Barium Swallow Study Patient Details Name: Angela Page MRN: [id number] Date of Birth: [birth year] Today's Date: 11/16/2015 Time: SLP Start Time (ACUTE ONLY): 1505-SLP Stop Time (ACUTE ONLY): 1537 SLP Time  Calculation (min) (ACUTE ONLY): 32 min Past Medical History: Past Medical History: Diagnosis Date . Asthma  . GERD (gastroesophageal reflux disease)  . Pacemaker  . Pleurisy  . Quadriparesis [facility])  Past Surgical History: Past Surgical History: Procedure Laterality Date . APPENDECTOMY   . CARDIAC SURGERY   . I&D EXTREMITY  10/28/2011  Procedure: IRRIGATION AND DEBRIDEMENT EXTREMITY;  Surgeon: Tennis Must, MD;  Location: [facility];  Service: Orthopedics;  Laterality: Right;  Irrigation and debridement right middle finger HPI: 45 yo female adm [facility] with leukocytosis and AMS 10/31/15.  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  Pt with h/o C2-C7 fusion and C3-C6 osteophytic changes per imaging study.  Pt resides with her caregiver mom per RN.  Hospital coarse complicated by pt requiring intubation for respiratory compromise, found to have occlusion of right bronchus with collapse of RM and RL lungs concerning for aspiration, left lung findings also concerning for aspiration.  Pt was placed on vent 10/12-10/17 - reintubated evening of 10/17 and trach placed 10/23.  Pt has been off the vent today full day and tolerating well.  Recent hospitalization for pna noted; pt coded and required defibrillation.  She also has h/o vent use for 3 months and trach *removed previously.  PMSV and swallow eval ordered.  Subjective: pt alert in chair, feeding tube removed for testing Assessment / Plan / Recommendation CHL IP CLINICAL IMPRESSIONS 11/16/2015 Therapy Diagnosis Suspected primary esophageal dysphagia;Mild pharyngeal phase dysphagia Clinical Impression Pt with only mild pharyngeal dysphagia without aspiration or deep penetration of any consistency tested.  Trace penetration of thin cleared within the same swallow.  She did NOT tolerate PMSV on for MBS due to quick complaint of difficulty breathing and overt breath stacking within 30 seconds.  Oral swallow was functional with only trace  oral residuals with solids.  Pharyngeal swallow minimally weak with resultant vallecular residuals with solids which pt largely senses and clears with dry swallows.  Minimal liquid residuals noted at valleculae and pyriform sinus without consistent awareness.     Pt's largest aspiration risk unfortunately appears to be esophageal.  Appearance of poor clearance of esophagus noted and SLP requested Dr Rosana Hoes *radiologist* view pt's esophagus due to positioning difficulties for full esophagram with pt's quadriparesis.  Dr Rosana Hoes reported pt with "dysmotility and significant reflux" to proximal esophagus.  Dedicated esophageal evaluation not conducted as per Dr Rosana Hoes, further information would not be obtained through esophagram.  Of note, pt also became fatigued from testing, causing concern for ability to meet nutrition regardless of esophageal concerns.  Skilled intervention included educating pt to findings using live video and reinforcing understanding.  SLP to follow.  Impact on safety and function Severe aspiration risk;Risk for inadequate nutrition/hydration   CHL IP TREATMENT RECOMMENDATION 11/16/2015 Treatment Recommendations Therapy as outlined in treatment plan below   Prognosis 11/16/2015 Prognosis for Safe Diet Advancement Guarded Barriers to Reach Goals Severity of deficits Barriers/Prognosis Comment -- CHL IP DIET RECOMMENDATION 11/16/2015 SLP Diet Recommendations Ice chips PRN after oral care Liquid Administration via -- Medication Administration Via alternative means Compensations -- Postural Changes --   CHL IP OTHER RECOMMENDATIONS 11/16/2015 Recommended Consults -- Oral Care Recommendations Oral care QID Other Recommendations --   CHL IP FOLLOW UP RECOMMENDATIONS 11/15/2015 Follow up Recommendations (No Data)   CHL IP FREQUENCY AND DURATION 11/16/2015 Speech Therapy Frequency (ACUTE ONLY) min 2x/week Treatment Duration 2 weeks      CHL IP ORAL PHASE 11/16/2015 Oral Phase Impaired Oral - Pudding  Teaspoon -- Oral - Pudding Cup -- Oral - Honey Teaspoon -- Oral - Honey Cup -- Oral - Nectar Teaspoon -- Oral - Nectar Cup -- Oral - Nectar Straw WFL Oral - Thin Teaspoon WFL Oral - Thin Cup WFL Oral - Thin Straw WFL Oral - Puree WFL Oral - Mech Soft -- Oral - Regular WFL Oral - Multi-Consistency -- Oral - Pill -- Oral Phase - Comment --  CHL IP PHARYNGEAL PHASE 11/16/2015 Pharyngeal Phase Impaired Pharyngeal- Pudding Teaspoon -- Pharyngeal -- Pharyngeal- Pudding Cup -- Pharyngeal -- Pharyngeal- Honey Teaspoon -- Pharyngeal -- Pharyngeal- Honey Cup -- Pharyngeal -- Pharyngeal- Nectar Teaspoon -- Pharyngeal -- Pharyngeal- Nectar Cup -- Pharyngeal -- Pharyngeal- Nectar Straw Pharyngeal residue - valleculae;Pharyngeal residue - pyriform Pharyngeal -- Pharyngeal- Thin Teaspoon Pharyngeal residue - valleculae;Pharyngeal residue - pyriform Pharyngeal -- Pharyngeal- Thin Cup Pharyngeal residue - valleculae;Pharyngeal residue - pyriform Pharyngeal -- Pharyngeal- Thin Straw Penetration/Aspiration during swallow Pharyngeal Material enters airway, remains ABOVE vocal cords then ejected out Pharyngeal- Puree Pharyngeal residue - valleculae Pharyngeal -- Pharyngeal- Mechanical Soft -- Pharyngeal -- Pharyngeal- Regular Pharyngeal residue - valleculae Pharyngeal -- Pharyngeal- Multi-consistency -- Pharyngeal -- Pharyngeal- Pill -- Pharyngeal -- Pharyngeal Comment pt largely sensed mild pharyngeal residuals and dry swallows faciliate clearance  CHL IP CERVICAL ESOPHAGEAL PHASE 11/16/2015 Cervical Esophageal Phase Impaired Pudding Teaspoon -- Pudding Cup -- Honey Teaspoon -- Honey Cup -- Nectar Teaspoon -- Nectar Cup -- Nectar Straw -- Thin Teaspoon -- Thin Cup -- Thin Straw -- Puree -- Mechanical Soft -- Regular -- Multi-consistency -- Pill -- Cervical Esophageal Comment appearance of poor clearance of esophagus, SLP requested Dr Earlene Plater *radiologist* view pt's esophagus due to positioning difficulties for esophagram with her  quadriparesis; Dr Earlene Plater reported pt with dysmotility and significant reflux to proximal esophagus; dedicated esophageal evaluation not conducted as per Dr Earlene Plater, further information would not be obtained through esophagram No flowsheet data found. Mickie Bail [place], [facility] SLP [phone]                   Scheduled Meds: . baclofen  10 mg Oral TID  . chlorhexidine gluconate (MEDLINE KIT)  15 mL Mouth Rinse BID  . clonazepam  0.5 mg Oral BID  . collagenase   Topical Daily  . docusate  100 mg Oral BID  . enoxaparin (LOVENOX) injection  40 mg Subcutaneous QHS  . feeding supplement (VITAL AF 1.2 CAL)  1,000 mL Per Tube Q24H  . free water  100 mL Per Tube Q8H  . gabapentin  300 mg Per Tube TID  . insulin aspart  0-9 Units Subcutaneous Q4H  .  ipratropium-albuterol  3 mL Nebulization Q6H  . mouth rinse  15 mL Mouth Rinse QID  . midodrine  10 mg Oral TID WC  . ranitidine  150 mg Per Tube Q12H  . sodium chloride flush  3 mL Intravenous Q12H  . venlafaxine  75 mg Per Tube BID  . zolpidem  5 mg Oral QHS   Continuous Infusions:    LOS: 18 days    Time spent: 25 minutes.    Dron Tanna Furry, MD [facility] Pager [phone]  If 7PM-7AM, please contact night-coverage [email or web address] Password [email or web address] 11/18/2015, 11:17 AM

## 2015-11-19 LAB — GLUCOSE, CAPILLARY
GLUCOSE-CAPILLARY: 127 mg/dL — AB (ref 65–99)
GLUCOSE-CAPILLARY: 84 mg/dL (ref 65–99)
GLUCOSE-CAPILLARY: 130 mg/dL — AB (ref 65–99)
Glucose-Capillary: 136 mg/dL — ABNORMAL HIGH (ref 65–99)
Glucose-Capillary: 95 mg/dL (ref 65–99)

## 2015-11-19 MED ORDER — VITAL AF 1.2 CAL PO LIQD
1000.0000 mL | ORAL | Status: DC
  Administered 2015-11-19 – 2015-11-22 (×6): 1000 mL
  Filled 2015-11-19 (×7): qty 1000

## 2015-11-19 NOTE — Progress Notes (Signed)
Speech Language Pathology Treatment: Dysphagia  Patient Details Name: Jaydy Azcarate MRN: [id number] DOB: 1970/06/30 Today's Date: 11/19/2015 Time: [id number] SLP Time Calculation (min) (ACUTE ONLY): 10 min  Assessment / Plan / Recommendation Clinical Impression  SLP received a call on Saturday and this am from RN regarding Ms Tombs and her desire to eat.  RN reports pt desires repeat swallow evaluation to see if she has changed.  SLP advised that repeating swallow evaluation will not change outcomes - advised RN and pt to this information.    Spoke to Ms. Berline Lopes who states she desires to eat with KNOWN aspiration risk including possible recurrent pneumonias, airway obstruction, etc.   Eating is a QOL issue for this pt  Advised her to concerns for nutritional support with her dysphagia.  Will phone MD with information re: pt's wishes.      HPI HPI: 45 yo female adm to [facility] with leukocytosis and AMS 10/31/15.  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  Pt with h/o C2-C7 fusion and C3-C6 osteophytic changes per imaging study.  Pt resides with her caregiver mom per RN.  Hospital coarse complicated by pt requiring intubation for respiratory compromise, found to have occlusion of right bronchus with collapse of RM and RL lungs concerning for aspiration, left lung findings also concerning for aspiration.  Pt was placed on vent 10/12-10/17 - reintubated evening of 10/17 and trach placed 10/23.  Pt has been off the vent today full day and tolerating well.  Recent hospitalization for pna noted; pt coded and required defibrillation.  She also has h/o vent use for 3 months and trach *removed previously.  PMSV and swallow eval ordered.       SLP Plan        Recommendations  Diet recommendations:  (po of full liquids vs npo except ice, dependent on pt desire)      Patient may use Passy-Muir Speech Valve:  (with RN and SLP) PMSV Supervision: Full MD: Please consider changing trach  tube to : [place], [place], [facility] SLP [phone]

## 2015-11-19 NOTE — Progress Notes (Signed)
Daily Progress Note   Patient Name: Angela Page       Date: 11/19/2015 DOB: Jun 03, 1970  Age: 45 y.o. MRN#: [id number] Attending Physician: Angela Fire, MD Primary Care Physician: PROVIDER NOT IN SYSTEM Admit Date: 10/31/2015  Reason for Consultation/Follow-up: Establishing goals of care and Non pain symptom management  Subjective: Pt was resting comfortably in bed on my arrival. She was off the vent and able to utilize the Passey-Muir valve to aid in communication. She endorsed anxiety with the recent changes in her diet allowance, now that there is concern for silent reflux aspiration. Overall, however, she remains optimistic that she will be able to get home and continue to have quality time with her family. She does express concern about the future, but is also clearly hopeful about improvement and stability.   Length of Stay: 19  Current Medications: Scheduled Meds:  . baclofen  10 mg Oral TID  . chlorhexidine gluconate (MEDLINE KIT)  15 mL Mouth Rinse BID  . clonazepam  0.5 mg Oral BID  . collagenase   Topical Daily  . docusate  100 mg Oral BID  . enoxaparin (LOVENOX) injection  40 mg Subcutaneous QHS  . free water  100 mL Per Tube Q8H  . gabapentin  300 mg Per Tube TID  . insulin aspart  0-9 Units Subcutaneous Q4H  . ipratropium-albuterol  3 mL Nebulization Q6H  . mouth rinse  15 mL Mouth Rinse QID  . midodrine  10 mg Oral TID WC  . ranitidine  150 mg Per Tube Q12H  . sodium chloride flush  3 mL Intravenous Q12H  . venlafaxine  75 mg Per Tube BID  . zolpidem  5 mg Oral QHS    Continuous Infusions: . feeding supplement (VITAL AF 1.2 CAL) 1,000 mL (11/19/15 1055)    PRN Meds: acetaminophen (TYLENOL) oral liquid 160 mg/5 mL, albuterol, artificial tears, bisacodyl, morphine injection, [DISCONTINUED] ondansetron  **OR** ondansetron (ZOFRAN) IV, oxyCODONE, sennosides, sodium chloride flush  Physical Exam    Alert and oriented x4 Calm and interactive Trach in place. Weak cough, but otherwise no respiratory distress Paraplegia       Vital Signs: BP 106/63   Pulse 80   Temp (!) 96.4 F (35.8 C)   Resp 14   Ht '5\' 2"'$  (1.575 m)   Wt 45.4 kg (100 lb 1.4 oz)   SpO2 99%   BMI 18.31 kg/m  SpO2: SpO2: 99 % O2 Device: O2 Device: Tracheostomy Collar O2 Flow Rate: O2 Flow Rate (L/min): 5 L/min  Intake/output summary:  Intake/Output Summary (Last 24 hours) at 11/19/15 1538 Last data filed at 11/19/15 1500  Gross per 24 hour  Intake          1438.42 ml  Output             2055 ml  Net          -616.58 ml   LBM: Last BM Date: 11/18/15 Baseline Weight: Weight: 43.1 kg (95 lb) Most recent weight: Weight: 45.4 kg (100 lb 1.4 oz)  Flowsheet Rows   Flowsheet Row Most Recent Value  Intake Tab  Referral Department  Critical care  Unit at Time of Referral  ICU  Palliative Care Primary Diagnosis  Sepsis/Infectious Disease  Date Notified  11/02/15  Palliative Care Type  New Palliative care  Reason for referral  Clarify Goals of Care  Date of Admission  10/31/15  Date first seen by Palliative Care  11/02/15  # of days Palliative referral response time  0 Day(s)  # of days IP prior to Palliative referral  2  Clinical Assessment  Psychosocial & Spiritual Assessment  Palliative Care Outcomes      Patient Active Problem List   Diagnosis Date Noted  . Dysphagia   . Shortness of breath   . Acute respiratory failure with hypoxemia (Angela Page)   . Encounter for intubation   . Malnutrition of moderate degree 11/01/2015  . Acute encephalopathy 10/31/2015  . Tobacco abuse 10/31/2015  . Asthma 10/31/2015  . Pressure ulcer 02/26/2015  . HCAP (healthcare-associated pneumonia) 02/25/2015  . Lower urinary tract infectious disease 02/25/2015  . Hypotension 02/25/2015  . Anemia 02/25/2015  . Enterococcus UTI  02/03/2015  . Sepsis (Angela Page) 01/31/2015  . RLL pneumonia (Angela Page) 01/31/2015  . Altered mental status 10/29/2014  . Encephalopathy, toxic 10/29/2014  . Toxic metabolic encephalopathy 93/81/8299  . Urinary tract infection due to Proteus 10/24/2014  . Leukocytosis 10/23/2014  . Hypokalemia 10/23/2014  . Decubitus ulcer of sacral region, stage 4 (Angela Page) 10/23/2014  . Anemia, iron deficiency 10/23/2014  . Severe sepsis (Angela Page)   . Septic shock (Angela Page) 10/22/2014  . Quadriplegia, C5-C7 incomplete (Angela Page) 10/22/2014  . Depression   . Protein-calorie malnutrition, severe (Angela Page) 09/05/2014  . Overdose of opiate or related narcotic 09/02/2014    Palliative Care Assessment & Plan   HPI: 45 y.o. female  with past medical history oftraumatic spinal injury with paraplegias/p traumatic fall with C2-7 posterior fusion 10/08/13 ,recurrent UTI, decubitus ulcer, malnutrition, recurrent sepsis, sick sinus syndrome (status post Medtronic dual Chamber Pacemaker) admitted on 10/31/2015 with HCAP, and sepsis.   She has now completed her antibiotic course and was successfully extubated and is now on a trach with high-flow O2. Ongoing attempt to slowly wean. Her malnutrition remains a concern. She is presently on NG tube feeding. She also has a high aspiration risk, as noted in Speech notes, however she is very hesitant to relinquish her right to eat and have a PEG placed.   Assessment: Palliative re-visited today to help clarify her nutrition goals and how to best meet these.   Angela Page is very aware of the concern for aspiration, and understands the potential significant implication/outcomes aspiration can have for her. However, she does view eating as an important pleasure source in her life and is reticent to give that up. When we discussed a PEG tube (which she has had before) as a means to provide adequate nutrition, she was agreeable to it so long as it was viewed as a temporary measure and she was allowed to  continue eating. We discussed that the duration of use was out of our control, but she could always opt to have it removed.   We again touched on code status, and she continues to remain focused on improvement and stabilization, with discharge home. We brought up the potential that, in the future, our interventions may not promote quality time at home with her family, and that limiting our interventions may better meet that goal. She agreed to think on this, though I'd expect no changes in code status presently.   Recommendations/Plan:  She is open to PEG assuming she can continue pleasure feeds;  would house the conversation in context of a temporary measure to ensure adequate nutrition  Pleasure feeding, as pt is very aware of the concern/risk for aspiration and is accepting of this risk  Goals of Care and Additional Recommendations:  Limitations on Scope of Treatment: Full Scope Treatment  Code Status:  Full code  Prognosis:   Unable to determine  Discharge Planning:  Home with [place] was discussed with patient  Thank you for allowing the Palliative Medicine Team to assist in the care of this patient.   Time In: 1400 Time Out: 1430 Total Time 30 Prolonged Time Billed  no       Greater than 50%  of this time was spent counseling and coordinating care related to the above assessment and plan.  Charlynn Court, NP Palliative Medicine Team Team Phone # [phone]

## 2015-11-19 NOTE — Progress Notes (Signed)
Date:  November 19, 2015 Chart reviewed for concurrent status and case management needs. Will continue to follow the patient for status change: Weaning to trache collar Discharge Planning: following for needs Expected discharge date: [id number] Velva Harman, BSN, [place], [place]

## 2015-11-19 NOTE — Progress Notes (Signed)
Speech Language Pathology Treatment: Dysphagia;Passy Muir Speaking valve  Patient Details Name: Angela Page MRN: [id number] DOB: 1970-02-24 Today's Date: 11/19/2015 Time: [id number] SLP Time Calculation (min) (ACUTE ONLY): 28 min  Assessment / Plan / Recommendation Clinical Impression  Spoke to Dr Carolin Sicks regarding pt's dysphagia and her desires and was given order to start pt on clear liquids closely monitoring her tolerance.    SLP placed PMSV on pt for 29 minutes with all vitals stable today - no breath stacking and good phonatory abilities.  Pt did become fatigued and asked for pmsv removal at end of session.  Recommend to continue use with full supervision and monitoring of vitals. Pt was able to verbalize precautions with speaking valve with Mod I!    Provided pt with ice chips and Boost Breeze with pmsv in place and reinforced helpful mitigation strategies.  Pt without indication of airway compromise with po and is conducting effortful double swallows without cues.    SLP had pt watch the MBS study on the computer to allow adequate understanding of swallowing test results.  Of note, pt now states she was having reflux issues with 2 weeks prior to admission but would not cough on refluxed material. Advised to possible SILENT reflux aspiration and indication for [name] to monitor pt's vitals closely.  Pt in agreement with plan and expressed gratitude for her care.  Will follow closely.  Thanks.    HPI HPI: 45 yo female adm to [facility] with leukocytosis and AMS 10/31/15.  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  Pt with h/o C2-C7 fusion and C3-C6 osteophytic changes per imaging study.  Pt resides with her caregiver mom per RN.  Hospital coarse complicated by pt requiring intubation for respiratory compromise, found to have occlusion of right bronchus with collapse of RM and RL lungs concerning for aspiration, left lung findings also concerning for aspiration.  Pt was placed on  vent 10/12-10/17 - reintubated evening of 10/17 and trach placed 10/23.  Pt has been off the vent today full day and tolerating well.  Recent hospitalization for pna noted; pt coded and required defibrillation.  She also has h/o vent use for 3 months and trach *removed previously.  PMSV and swallow eval ordered.       SLP Plan  Continue with current plan of care     Recommendations  Diet recommendations: Other(comment) (clears floor stock) Liquids provided via: Teaspoon Medication Administration: Via alternative means Supervision: Full supervision/cueing for compensatory strategies Compensations: Slow rate;Small sips/bites;Multiple dry swallows after each bite/sip Postural Changes and/or Swallow Maneuvers: Seated upright 90 degrees;Upright 30-60 min after meal (as able with wounds)      Patient may use Passy-Muir Speech Valve:  (with RN and SLP) PMSV Supervision: Full MD: Please consider changing trach tube to : Cuffless         Oral Care Recommendations: Oral care BID Follow up Recommendations: Home health SLP;Skilled Nursing facility Plan: Continue with current plan of care       [place], [place], [facility] SLP [phone]

## 2015-11-19 NOTE — Progress Notes (Addendum)
Nutrition Follow-up  DOCUMENTATION CODES:   Non-severe (moderate) malnutrition in context of acute illness/injury, Underweight  INTERVENTION:  - Continue Vital 1.2 @ 65 mL/hr  - Continue PEPuP protocol. - RD will follow-up 11/1.  NUTRITION DIAGNOSIS:   Inadequate oral intake related to inability to eat as evidenced by NPO status. -ongoing  GOAL:   Patient will meet greater than or equal to 90% of their needs -met with current TF regimen.  MONITOR:   TF tolerance, Weight trends, Labs, Skin, I & O's, Other (Comment) (Ability to advance diet)  ASSESSMENT:   45 y.o. 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 Presented with worsening confusion status decreased by mouth intake foul-smelling decubitus ulcer. Apparently has been more sedated than usual at home; family did endorse that patient may have possibly received more narcotics than usual. Family also states that she has had decreased by mouth intake. Mother was very concerned that patient developed lung cancer based on the fact that her father died of it and want patient to be tested extensively. In ED patient required Narcan 2 and responded: suggestive of opioid overdose contributing to oversedation  10/30 Pt continues with NGT. SLP note from 10/27 PM reviewed. SLP working with pt this AM at time of RD visit; will monitor for possible repeat swallow evaluation versus plan for PEG placement. Order in place for Vital 1.2 @ 65 mL/hr which provides 1872 kcal, 117 grams of protein, and 1265 mL free water. TF not hung/infusing at this time. RN reports that TF has been off since ~0400 today d/t previous bottle running out and inability of night RN to scan in a new bottle as "frequency" of  order is for pt to only receive one bottle/24 hours; updated order and communicated with RN. PEPuP protocol in place.  Weight -13.9 kg since 10/27 assessment. Estimated nutrition needs adjusted slightly; needs remain elevated d/t multiple stage 4 wounds.   Medications reviewed; PRN Dulcolax, 0.5 mg Klonopin BID, 100 mg Colace BID, sliding scale Novolog, PRN IV Zofran, PRN Senokot. Labs reviewed; CBGs: 84 and 127 mg/dL this AM, creatinine: <0.3 mg/dL.   ADDENDUM: At time of RD visit, BP: 97/53 and MAP: 66. Spoke with SLP who reports plan for CLD via teaspoon starting today.     10/27 - Pt continues with trach collar and SLP worked with pt with PMV yesterday.  - SLP also trialed POs yesterday and recommended ice chips only.  - She is currently receiving TF at goal rate via NGT: Vital 1.2 @ 65 mL/hr.  - Free water flush remains ordered but not programmed into pump at this time.  - Per Dr. Anastasia Pall note this AM, hopeful for diet advancement, calorie count with diet advancement to assess if PEG can be avoided.  - Weight -1.2 kg from yesterday.    10/26 - Pt continues with trach collar and NGT and is currently receiving Vital 1.2 @ 35 mL/hr.  - This regimen is providing 1008 kcal, 63 grams of protein, and 681 mL free water.  - Pt denies abdominal pain or nausea this AM; she states that she is feeling hungry.  - Continue to advance TF to goal: Vital 1.2 @ 65 mL/hr which will provide 1872 kcal, 117 grams of protein (95% estimated protein need), and 1265 mL free water.  -  Order in place for 100 mL free water TID but not currently programmed into the pump.  - Weight -1.3 kg from yesterday.   Diet Order:  Diet NPO time specified  Skin: Stage 4 pressure injuries to coccyx and bilateral buttocks, Stage 1 pressure injuries to mid, upper, bilateral back and L shoulder, Unstageable wound to cervical area.    Last BM:  10/30  Height:   Ht Readings from Last 1 Encounters:  11/07/15 _0   (1.575 m)    Weight:   Wt Readings from Last 1 Encounters:  11/19/15 100 lb 1.4 oz (45.4 kg)    Ideal Body Weight:  47.5 kg  BMI:  Body mass index is 18.31 kg/m.  Estimated Nutritional Needs:   Kcal:  1816-2043 (40-45 kcal/kg)  Protein:  100-114 grams (2.2-2.5 grams/kg)  Fluid:  >/= 1.8 L/day  EDUCATION NEEDS:   No education needs identified at this time    Jarome Matin, MS, RD, LDN Inpatient Clinical Dietitian Pager # [phone] After hours/weekend pager # [phone]

## 2015-11-19 NOTE — Progress Notes (Addendum)
PROGRESS NOTE    Angela Page  [id number] DOB: 10-Sep-1970 DOA: 10/31/2015 PCP: PROVIDER NOT IN SYSTEM   Brief Narrative: 45 y.o. female with medical history significant of traumatic spinal injury with paraplegia s/p traumatic fall with C2-7 posterior fusion 10/08/13 , recurrent UTI, decubitus ulcer, malnutrition, recurrent sepsis, Sick sinus syndrome status post Medtronic dual Chamber Pacemaker placement 11/03/13, presented with worsening confusion, decreased by mouth intake, foul-smelling decubitus ulcer.  She developed progressive hypotension and respiratory failure from HCAP and a UTI leading to septic shock. Completed antibiotics course. Patient is extubated. Evaluation for nutrition ongoing. CCM transferred to [facility] on 11/17/2015, on 17th days of inpatient hospitalization. Assessment & Plan:   #Acute hypoxic/hypercapnic respiratory failure secondary to HCAP, sepsis: Improved. Patient completed antibiotics course as per medical record. Successfully extubated and currently on trach with high flow oxygen. Pulmonary evaluation and treatment ongoing. -Still requiring high flow oxygen. Try to wean gradually. Discussed with the pulmonary team.  # Protein-calorie malnutrition, severe ([place]): Speech and swallow evaluation note reviewed. The largest aspiration risk appeared to be esophageal. Recommended only ice chips. I discussed with the speech team today, no need for another speech and swallow evaluation. I discussed with the patient at bedside about feeding and nutrition. She continues to decline gastric tube placement. Patient can make her clinical decision. If she wishes we can try appreciated clear liquid to see if she can tolerate. I also discussed with the palliative care team who was following patient in the beginning. Palliative care team will also discussed with the patient. Meantime I'll continue to monitor. She is receiving NG tube feeding.  - Dietary consult appreciated.  #Septic  shock results now. 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  #Hypotension: On midodrine. Blood pressure improved. Continue to monitor.  Other hospital problems list:     Quadriplegia, C5-C7 incomplete [place]): Continue baclofen and supportive care   Severe sepsis ([place])   Decubitus ulcer of sacral region, stage 4 (HCC)   Lower urinary tract infectious disease   Acute encephalopathy   History of asthma and tobacco abuse   Ileus: Improved   Hyperglycemia due to steroid  Continue current medical and supportive care. Dressing change, monitoring blood pressure, blood sugar level.  DVT prophylaxis: Lovenox subcutaneous Code Status: Full code Family Communication: No family present at bedside Disposition Plan: Currently at stepdown. Likely discharge home versus nursing home in 2-3 days.    Consultants:   Pulmonary critical care.  Procedures: Lt femoral aline 10/13 >> 10/14 ETT 10/13 >> 10/17, 10/18 >>  Lt IJ CVL 10/13 >> 10/26  SIGNIFICANT EVENTS: 10/11  Admit 10/13  VDRF, pressors, bronch 10/15  Vasopressors off  10/16  Fever, weaning on PSV 10/5 10/17  Extubated, failed and reintubated overnight 10/20  Weaning on PSV, plan for trach 10/23 10/23 Tracheostomy  Antimicrobials: Vancomycin 10/11 >> 10/15 Levaquin 10/11 >> 10/12 Aztreonam 10/11 >> 10/12  Diflucan 10/12 >> 10/14 Primaxin 10/12 >> 10/22  STUDIES:  CT chest 10/12 >> occlusion of Rt bronchus intermedius, basilar consolidation, small Rt effusion Bronchoscopy 10/13 >> significant purulent secretions airways CT Pelvis 10/16 >> no evidence of emphysematous fasciitis along the perineum, stage IV decubitus ulcers along the sacrum / ischial tuberosities associated with chronic osteomyelitis CT abdomen 10/24 > ileus, anasarca  CULTURES: Blood 10/11 >> neg Urine 10/11 >> Acinetobacter, yeast Sputum 10/13 >> neg  Subjective: patient was seen and examined at stepdown unit. Denied fever, chills or  nausea, vomiting or abdominal pain. Reported generalized  weakness. Continue to decline gastric tube placement for the feeding. No chest pain, shortness of breath.  Objective: Vitals:   11/19/15 0849 11/19/15 0900 11/19/15 1000 11/19/15 1100  BP:  (!) 97/53 113/66 116/62  Pulse:      Resp:  _0 Temp:  97.2 F (36.2 C) (!) 96.6 F (35.9 C) (!) 96.6 F (35.9 C)  TempSrc:      SpO2: 99% 100% 94% 95%  Weight:      Height:        Intake/Output Summary (Last 24 hours) at 11/19/15 1138 Last data filed at 11/19/15 0936  Gross per 24 hour  Intake             1408 ml  Output             1475 ml  Net              -67 ml   Filed Weights   11/17/15 0500 11/18/15 0400 11/19/15 0400  Weight: 47.9 kg (105 lb 9.6 oz) 48 kg (105 lb 13.1 oz) 45.4 kg (100 lb 1.4 oz)    Examination:  General exam:Cachectic female with NG tube feeding. Not in distress Neck: Tracheostomy site clean with no bleeding. On high flow oxygen Respiratory system: Coarse breath sound bilateral, respiratory effort normal. Cardiovascular system: Regular rate rhythm, S1-S2 normal. No pedal edema Gastrointestinal system: Abdomen soft, nontender, nondistended. Bowel sound positive. Central nervous system: Alert awake and following commands. She is oriented. Extremities: paraplegia Psychiatry: Judgement and insight appear normal. Mood & affect appropriate.     Data Reviewed: I have personally reviewed following labs and imaging studies  CBC:  Recent Labs Lab 11/13/15 0550 11/13/15 1003 11/17/15 0826  WBC 9.5 9.8 12.8*  NEUTROABS  --  8.1*  --   HGB 7.0* 7.3* 9.4*  HCT 21.2* 22.2* 29.0*  MCV 91.0 90.6 95.4  PLT 452* 476* 701*   Basic Metabolic Panel:  Recent Labs Lab 11/14/15 0518 11/14/15 1720 11/15/15 0359 11/17/15 0826 11/18/15 2144  NA 140 138 139 138 140  K 2.9* 4.1 3.3* 3.8 3.6  CL 101 102 102 103 103  CO2 32 _1 GLUCOSE 71 89 94 117* 116*  BUN _2 CREATININE <0.30*  <0.30* <0.30* <0.30* <0.30*  CALCIUM 7.9* 8.0* 7.9* 8.5* 8.9  MG  --   --   --  2.2  --    GFR: CrCl cannot be calculated (This lab value cannot be used to calculate CrCl because it is not a number: <0.30). Liver Function Tests: No results for input(s): AST, ALT, ALKPHOS, BILITOT, PROT, ALBUMIN in the last 168 hours. No results for input(s): LIPASE, AMYLASE in the last 168 hours. No results for input(s): AMMONIA in the last 168 hours. Coagulation Profile: No results for input(s): INR, PROTIME in the last 168 hours. Cardiac Enzymes: No results for input(s): CKTOTAL, CKMB, CKMBINDEX, TROPONINI in the last 168 hours. BNP (last 3 results) No results for input(s): PROBNP in the last 8760 hours. HbA1C: No results for input(s): HGBA1C in the last 72 hours. CBG:  Recent Labs Lab 11/18/15 1948 11/18/15 2342 11/19/15 0422 11/19/15 0721 11/19/15 1121  GLUCAP 121* 124* 127* 84 136*   Lipid Profile: No results for input(s): CHOL, HDL, LDLCALC, TRIG, CHOLHDL, LDLDIRECT in the last 72 hours. Thyroid Function Tests: No results for input(s): TSH, T4TOTAL, FREET4, T3FREE, THYROIDAB in the last 72 hours. Anemia Panel: No results for input(s):  VITAMINB12, FOLATE, FERRITIN, TIBC, IRON, RETICCTPCT in the last 72 hours. Sepsis Labs: No results for input(s): PROCALCITON, LATICACIDVEN in the last 168 hours.  No results found for this or any previous visit (from the past 240 hour(s)).       Radiology Studies: No results found.      Scheduled Meds: . baclofen  10 mg Oral TID  . chlorhexidine gluconate (MEDLINE KIT)  15 mL Mouth Rinse BID  . clonazepam  0.5 mg Oral BID  . collagenase   Topical Daily  . docusate  100 mg Oral BID  . enoxaparin (LOVENOX) injection  40 mg Subcutaneous QHS  . free water  100 mL Per Tube Q8H  . gabapentin  300 mg Per Tube TID  . insulin aspart  0-9 Units Subcutaneous Q4H  . ipratropium-albuterol  3 mL Nebulization Q6H  . mouth rinse  15 mL Mouth Rinse QID   . midodrine  10 mg Oral TID WC  . ranitidine  150 mg Per Tube Q12H  . sodium chloride flush  3 mL Intravenous Q12H  . venlafaxine  75 mg Per Tube BID  . zolpidem  5 mg Oral QHS   Continuous Infusions: . feeding supplement (VITAL AF 1.2 CAL) 1,000 mL (11/19/15 1055)     LOS: 19 days    Time spent: 21mnutes.    Drexel Ivey PTanna Furry MD [facility] Pager [phone] If 7PM-7AM, please contact night-coverage [email or web address] Password [email or web address] 11/19/2015, 11:38 AM

## 2015-11-19 NOTE — Progress Notes (Signed)
Patient tolerated liquids and jello well throughout the day. Will to continue to monitor.

## 2015-11-19 NOTE — Progress Notes (Signed)
Patient placed on some POs today after swallow evaluation.  She is being monitored closely.  If she fails, please reconsult for PEG, otherwise for now, we will sign off.  Lonza Shimabukuro E 12:00 PM 11/19/2015

## 2015-11-19 NOTE — Progress Notes (Signed)
RT placed PT on new ATC set up at 28%.

## 2015-11-20 LAB — GLUCOSE, CAPILLARY
GLUCOSE-CAPILLARY: 133 mg/dL — AB (ref 65–99)
GLUCOSE-CAPILLARY: 114 mg/dL — AB (ref 65–99)
Glucose-Capillary: 109 mg/dL — ABNORMAL HIGH (ref 65–99)
Glucose-Capillary: 111 mg/dL — ABNORMAL HIGH (ref 65–99)
Glucose-Capillary: 118 mg/dL — ABNORMAL HIGH (ref 65–99)
Glucose-Capillary: 119 mg/dL — ABNORMAL HIGH (ref 65–99)
Glucose-Capillary: 99 mg/dL (ref 65–99)

## 2015-11-20 MED ORDER — ENOXAPARIN SODIUM 40 MG/0.4ML ~~LOC~~ SOLN
40.0000 mg | Freq: Every day | SUBCUTANEOUS | Status: DC
  Administered 2015-11-21: 40 mg via SUBCUTANEOUS
  Filled 2015-11-20: qty 0.4

## 2015-11-20 NOTE — Progress Notes (Signed)
PROGRESS NOTE    Angela Page  DXA:[id number] DOB: 1970-07-26 DOA: 10/31/2015 PCP: PROVIDER NOT IN SYSTEM   Brief Narrative: 45 y.o. female with medical history significant of traumatic spinal injury with paraplegia s/p traumatic fall with C2-7 posterior fusion 10/08/13 , recurrent UTI, decubitus ulcer, malnutrition, recurrent sepsis, Sick sinus syndrome status post Medtronic dual Chamber Pacemaker placement 11/03/13, presented with worsening confusion, decreased by mouth intake, foul-smelling decubitus ulcer.  She developed progressive hypotension and respiratory failure from HCAP and a UTI leading to septic shock. Completed antibiotics course. Patient is extubated. Evaluation for nutrition ongoing. CCM transferred to [facility] on 11/17/2015, on 17th days of inpatient hospitalization.  Today, patient agreed for gastric tube placement. Meantime, she likes to continue jello or clear diet as tolerated. I spoke with Dr. Earleen Newport from IR for G tube placement.  Assessment & Plan:   #Acute hypoxic/hypercapnic respiratory failure secondary to HCAP, sepsis: Improved. Patient completed antibiotics course as per medical record. Successfully extubated and currently on trach. Pulmonary evaluation and treatment ongoing. Pulm recommended trach OK to change to #6 cuffless prior to discharge -continue supportive care.  # Protein-calorie malnutrition, severe ([place]): Speech and swallow evaluation note reviewed. The largest aspiration risk appeared to be esophageal. Discussed with the patient and she agreed with the PEG tube placement today. Meantime she likes to continue Jell-O or clear liquid as tolerated. I discussed with the interventional radiologist  Dr Earleen Newport about it. I placed a consult to IR.   #Septic shock results now. 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  #Hypotension: On midodrine. Blood pressure improved. Continue to monitor.  Speech and swallow team, dietary consult appreciated.  Hopefully, after placement of gastric tube we will be able to discharge patient home with home care and to feet. Patient is declining SNF at this time.  Other hospital problems list:     Quadriplegia, C5-C7 incomplete [facility]): Continue baclofen and supportive care   Severe sepsis ([place])   Decubitus ulcer of sacral region, stage 4 (HCC)   Lower urinary tract infectious disease   Acute encephalopathy   History of asthma and tobacco abuse   Ileus: Improved   Hyperglycemia due to steroid  Continue current medical and supportive care. Dressing change, monitoring blood pressure, blood sugar level.  DVT prophylaxis: Lovenox subcutaneous Code Status: Full code Family Communication: No family present at bedside Disposition Plan: Currently at stepdown. Likely discharge home versus nursing home in 1-2 days after gastric tube placement and with tube feed.  Consultants:   Pulmonary critical care.  Interventional radiologist  Procedures: Lt femoral aline 10/13 >> 10/14 ETT 10/13 >> 10/17, 10/18 >>  Lt IJ CVL 10/13 >> 10/26  SIGNIFICANT EVENTS: 10/11  Admit 10/13  VDRF, pressors, bronch 10/15  Vasopressors off  10/16  Fever, weaning on PSV 10/5 10/17  Extubated, failed and reintubated overnight 10/20  Weaning on PSV, plan for trach 10/23 10/23 Tracheostomy  Antimicrobials: Vancomycin 10/11 >> 10/15 Levaquin 10/11 >> 10/12 Aztreonam 10/11 >> 10/12  Diflucan 10/12 >> 10/14 Primaxin 10/12 >> 10/22  STUDIES:  CT chest 10/12 >> occlusion of Rt bronchus intermedius, basilar consolidation, small Rt effusion Bronchoscopy 10/13 >> significant purulent secretions airways CT Pelvis 10/16 >> no evidence of emphysematous fasciitis along the perineum, stage IV decubitus ulcers along the sacrum / ischial tuberosities associated with chronic osteomyelitis CT abdomen 10/24 > ileus, anasarca  CULTURES: Blood 10/11 >> neg Urine 10/11 >> Acinetobacter, yeast Sputum 10/13 >> neg  Subjective:  patient was seen  and examined at bedside. Patient reported feeling well. Today she wants to go for gastric tube is recently if we can give her clear liquid as she can tolerate. Patient has weakness. Denied fever, chills, nausea, vomiting, abdominal pain, chest pain, shortness of breath. Objective: Vitals:   11/20/15 0900 11/20/15 1000 11/20/15 1100 11/20/15 1200  BP:  100/60  (!) 101/52  Pulse:      Resp: _0 Temp: 98.8 F (37.1 C) 98.6 F (37 C) 98.8 F (37.1 C) 98.8 F (37.1 C)  TempSrc:      SpO2: 100% 100% 100% 100%  Weight:      Height:        Intake/Output Summary (Last 24 hours) at 11/20/15 1238 Last data filed at 11/20/15 1200  Gross per 24 hour  Intake             1970 ml  Output             2500 ml  Net             -530 ml   Filed Weights   11/18/15 0400 11/19/15 0400 11/20/15 0500  Weight: 48 kg (105 lb 13.1 oz) 45.4 kg (100 lb 1.4 oz) 43.3 kg (95 lb 7.4 oz)    Examination:  General exam:Cachectic female with NG tube for feeding, not in distress Neck: Trachea stoma site clean with no bleeding. Respiratory system: Clear to auscultation bilateral no wheeze Cardiovascular system: Regular rate rhythm, S1 and S2 normal. No pedal edema Gastrointestinal system: Bowel sound positive, abdomen soft, nontender and not distended Central nervous system: Alert, awake, following commands and oriented. Extremities: paraplegia Psychiatry: Judgement and insight appear normal. Mood & affect appropriate.     Data Reviewed: I have personally reviewed following labs and imaging studies  CBC:  Recent Labs Lab 11/17/15 0826  WBC 12.8*  HGB 9.4*  HCT 29.0*  MCV 95.4  PLT 712*   Basic Metabolic Panel:  Recent Labs Lab 11/14/15 0518 11/14/15 1720 11/15/15 0359 11/17/15 0826 11/18/15 2144  NA 140 138 139 138 140  K 2.9* 4.1 3.3* 3.8 3.6  CL 101 102 102 103 103  CO2 32 _1 GLUCOSE 71 89 94 117* 116*  BUN _2 CREATININE <0.30* <0.30*  <0.30* <0.30* <0.30*  CALCIUM 7.9* 8.0* 7.9* 8.5* 8.9  MG  --   --   --  2.2  --    GFR: CrCl cannot be calculated (This lab value cannot be used to calculate CrCl because it is not a number: <0.30). Liver Function Tests: No results for input(s): AST, ALT, ALKPHOS, BILITOT, PROT, ALBUMIN in the last 168 hours. No results for input(s): LIPASE, AMYLASE in the last 168 hours. No results for input(s): AMMONIA in the last 168 hours. Coagulation Profile: No results for input(s): INR, PROTIME in the last 168 hours. Cardiac Enzymes: No results for input(s): CKTOTAL, CKMB, CKMBINDEX, TROPONINI in the last 168 hours. BNP (last 3 results) No results for input(s): PROBNP in the last 8760 hours. HbA1C: No results for input(s): HGBA1C in the last 72 hours. CBG:  Recent Labs Lab 11/19/15 1943 11/20/15 0034 11/20/15 0359 11/20/15 0827 11/20/15 1217  GLUCAP 130* 118* 111* 133* 114*   Lipid Profile: No results for input(s): CHOL, HDL, LDLCALC, TRIG, CHOLHDL, LDLDIRECT in the last 72 hours. Thyroid Function Tests: No results for input(s): TSH, T4TOTAL, FREET4, T3FREE, THYROIDAB in the last 72 hours. Anemia Panel: No  results for input(s): VITAMINB12, FOLATE, FERRITIN, TIBC, IRON, RETICCTPCT in the last 72 hours. Sepsis Labs: No results for input(s): PROCALCITON, LATICACIDVEN in the last 168 hours.  No results found for this or any previous visit (from the past 240 hour(s)).       Radiology Studies: No results found.      Scheduled Meds: . baclofen  10 mg Oral TID  . chlorhexidine gluconate (MEDLINE KIT)  15 mL Mouth Rinse BID  . clonazepam  0.5 mg Oral BID  . collagenase   Topical Daily  . docusate  100 mg Oral BID  . enoxaparin (LOVENOX) injection  40 mg Subcutaneous QHS  . free water  100 mL Per Tube Q8H  . gabapentin  300 mg Per Tube TID  . insulin aspart  0-9 Units Subcutaneous Q4H  . ipratropium-albuterol  3 mL Nebulization Q6H  . mouth rinse  15 mL Mouth Rinse QID  .  midodrine  10 mg Oral TID WC  . ranitidine  150 mg Per Tube Q12H  . sodium chloride flush  3 mL Intravenous Q12H  . venlafaxine  75 mg Per Tube BID  . zolpidem  5 mg Oral QHS   Continuous Infusions: . feeding supplement (VITAL AF 1.2 CAL) 1,000 mL (11/20/15 1200)     LOS: 20 days    Time spent:26 minutes.    Dron Tanna Furry, MD [facility] Pager [phone]  If 7PM-7AM, please contact night-coverage [email or web address] Password [email or web address] 11/20/2015, 12:38 PM

## 2015-11-20 NOTE — Progress Notes (Signed)
[facility] PCCM  S: feels ok, remains off vent, speaking well with PMV O: Vitals:   11/20/15 0400 11/20/15 0500 11/20/15 0600 11/20/15 0758  BP: 116/61  140/81   Pulse:    94  Resp: (!) 21  14 14   Temp: 99.3 F (37.4 C)  98.6 F (37 C) 98.8 F (37.1 C)  TempSrc: Core (Comment)     SpO2: 100%  100% 100%  Weight:  95 lb 7.4 oz (43.3 kg)    Height:       On exam: Chronically ill appearing, no distress Lungs clear, normal effort CV: RRR, no mgr Belly soft, nontender NG in place  Impression/Plan:  Tracheostomy status > would not downsize, OK to change to #6 cuffless prior to discharge, would not consider decannulation until she has several months of stability without respiratory failure Dysphagia Quadriplegia Recent HCAP > resolved Protein calorie malnutrition> continue tube feedings, she is agreeable to PEG, would recommend this  Needs SNF but refuses  Roselie Awkward, MD [place] [facility] Pager: [phone] Cell: [phone] After 3pm or if no response, call [phone]

## 2015-11-20 NOTE — Progress Notes (Signed)
Daily Progress Note   Page Name: Angela Page       Date: 11/20/2015 DOB: February 04, 1970  Age: 45 y.o. MRN#: [id number] Attending Physician: Rosita Fire, MD Primary Care Physician: PROVIDER NOT IN SYSTEM Admit Date: 10/31/2015  Reason for Consultation/Follow-up: Establishing goals of care and Non pain symptom management  Subjective: Pt was resting comfortably in bed on my arrival. She was off Angela vent and able to utilize Angela Passey-Muir valve to aid in communication. Her mother was supportive at Angela bedside. Angela Page remains focused on discharge timeline, and plan for escalating oral diet. Her mother was also concerned about wound care here, as it differed from her home regimen, and Angela plan for wound management after discharge.   Length of Stay: 20  Current Medications: Scheduled Meds:  . baclofen  10 mg Oral TID  . chlorhexidine gluconate (MEDLINE KIT)  15 mL Mouth Rinse BID  . clonazepam  0.5 mg Oral BID  . collagenase   Topical Daily  . docusate  100 mg Oral BID  . [START ON 11/21/2015] enoxaparin (LOVENOX) injection  40 mg Subcutaneous QHS  . free water  100 mL Per Tube Q8H  . gabapentin  300 mg Per Tube TID  . insulin aspart  0-9 Units Subcutaneous Q4H  . ipratropium-albuterol  3 mL Nebulization Q6H  . mouth rinse  15 mL Mouth Rinse QID  . midodrine  10 mg Oral TID WC  . ranitidine  150 mg Per Tube Q12H  . sodium chloride flush  3 mL Intravenous Q12H  . venlafaxine  75 mg Per Tube BID  . zolpidem  5 mg Oral QHS    Continuous Infusions: . feeding supplement (VITAL AF 1.2 CAL) 1,000 mL (11/20/15 1200)    PRN Meds: acetaminophen (TYLENOL) oral liquid 160 mg/5 mL, albuterol, artificial tears, bisacodyl, morphine injection, [DISCONTINUED] ondansetron **OR** ondansetron (ZOFRAN) IV, oxyCODONE, sennosides, sodium  chloride flush  Physical Exam    Alert and oriented x4 Calm and interactive Trach in place. Weak cough, but otherwise no respiratory distress Paraplegia       Vital Signs: BP (!) 101/52   Pulse 94   Temp 98.8 F (37.1 C)   Resp 15   Ht 5' 2" (1.575 m)   Wt 43.3 kg (95 lb 7.4 oz)   SpO2 100%   BMI 17.46 kg/m  SpO2: SpO2: 100 % O2 Device: O2 Device: Tracheostomy Collar O2 Flow Rate: O2 Flow Rate (L/min): 5 L/min  Intake/output summary:   Intake/Output Summary (Last 24 hours) at 11/20/15 1412 Last data filed at 11/20/15 1200  Gross per 24 hour  Intake             1740 ml  Output             2500 ml  Net             -760 ml   LBM: Last BM Date: 11/18/15 Baseline Weight: Weight: 43.1 kg (95 lb) Most recent weight: Weight: 43.3 kg (95 lb 7.4 oz)  Flowsheet Rows   Flowsheet Row Most Recent Value  Intake Tab  Referral Department  Critical care  Unit at Time of Referral  ICU  Palliative  Care Primary Diagnosis  Sepsis/Infectious Disease  Date Notified  11/02/15  Palliative Care Type  New Palliative care  Reason for referral  Clarify Goals of Care  Date of Admission  10/31/15  Date first seen by Palliative Care  11/02/15  # of days Palliative referral response time  0 Day(s)  # of days IP prior to Palliative referral  2  Clinical Assessment  Psychosocial & Spiritual Assessment  Palliative Care Outcomes      Page Active Problem List   Diagnosis Date Noted  . Palliative care encounter   . Goals of care, counseling/discussion   . Dysphagia   . Shortness of breath   . Acute respiratory failure with hypoxemia ([place])   . Encounter for intubation   . Malnutrition of moderate degree 11/01/2015  . Acute encephalopathy 10/31/2015  . Tobacco abuse 10/31/2015  . Asthma 10/31/2015  . Pressure ulcer 02/26/2015  . HCAP (healthcare-associated pneumonia) 02/25/2015  . Lower urinary tract infectious disease 02/25/2015  . Hypotension 02/25/2015  . Anemia 02/25/2015  .  Enterococcus UTI 02/03/2015  . Sepsis ([place]) 01/31/2015  . RLL pneumonia ([place]) 01/31/2015  . Altered mental status 10/29/2014  . Encephalopathy, toxic 10/29/2014  . Toxic metabolic encephalopathy 35/70/1779  . Urinary tract infection due to Proteus 10/24/2014  . Leukocytosis 10/23/2014  . Hypokalemia 10/23/2014  . Decubitus ulcer of sacral region, stage 4 ([place]) 10/23/2014  . Anemia, iron deficiency 10/23/2014  . Severe sepsis ([place])   . Septic shock ([place]) 10/22/2014  . Quadriplegia, C5-C7 incomplete ([place]) 10/22/2014  . Depression   . Protein-calorie malnutrition, severe ([place]) 09/05/2014  . Overdose of opiate or related narcotic 09/02/2014    Palliative Care Assessment & Plan   HPI: 45 y.o. female  with past medical history oftraumatic spinal injury with paraplegias/p traumatic fall with C2-7 posterior fusion 10/08/13 ,recurrent UTI, decubitus ulcer, malnutrition, recurrent sepsis, sick sinus syndrome (status post Medtronic dual Chamber Pacemaker) admitted on 10/31/2015 with HCAP, and sepsis.   She has now completed her antibiotic course and was successfully extubated and is now on a trach with high-flow O2. Ongoing attempt to slowly wean. Her malnutrition remains a concern. She is presently on NG tube feeding. She also has a high aspiration risk, as noted in Speech notes. She is now open for PEG placement, scheduled for 11/1, as long as it is viewed as a temporary measure and she is allowed to escalate her oral intake (acknowledging Angela risks).  Assessment: Palliative re-visited today per Angela request of Angela Page and her mother. They had questions surrounding Angela trach, plans for discharge, and oral intake allowance.   As addressed in my note yesterday, Angela Page is very aware of Angela concern for aspiration, and understands Angela potential significant implication/outcomes aspiration can have for her. However, she does view eating as an important pleasure source in her life and is  reticent to give that up. That said, she understands her nutritional deficit has deleterious effects, and is therefore agreeable to a PEG tube. She does want this tube viewed as a temporary measure, however. We discussed that Angela duration of use was out of our control, but she could always opt to have it removed.   Angela trajectory towards discharge was deferred to Angela primary team. We discussed Angela likely course of down-sizing Angela trach and Angela slow wean process. We did express our expectation that she would be discharged home with Angela trach, which seemed to surprise both her and her mother (they were clearly  hoping that would not be Angela case). We also encouraged her to discuss Angela progression of oral intake allowance (i.e diet progression) with her primary team, as they make Angela ultimate decision on that.  Recommendations/Plan:  Continue to plan for PEG placement  Given her acknowledgement and acceptance of risk, would support escalation of diet towards pleasure feeds  Wound consult placed, as mother with questions about wound care here and after discharge  Continue communicating with pt and mother about discharge timeline; clearly there is anxiety about when this will happen, and they would benefit from knowing what we are still monitoring/thresholds to enable safe discharge  Goals of Care and Additional Recommendations:  Limitations on Scope of Treatment: Full Scope Treatment  Code Status:  Full code  Prognosis:   Unable to determine  Discharge Planning:  Home with [place] was discussed with Page and her mother  Thank you for allowing Angela Palliative Medicine Team to assist in Angela care of this Page.   Time In: 1340 Time Out: 1420 Total Time 40 minutes Prolonged Time Billed  no       Greater than 50%  of this time was spent counseling and coordinating care related to Angela above assessment and plan.  Charlynn Court, NP Palliative Medicine Team Team Phone #  [phone]

## 2015-11-20 NOTE — Progress Notes (Signed)
  Request seen for placement of gastrostomy tube.  Most recent abdominal film reviewed by Dr. Earleen Newport.  Still with moderate ileus although this has improved since CT scan.  Last WBC 12. Low grade fever 99.3 today.  Will repeat CBC. Will repeat abdominal film in am.  Hopefully Ileus will continue to improve and can move forward with gastrostomy placement tomorrow.   Lenix Kidd S Banita Lehn PA-C 11/20/2015 1:55 PM

## 2015-11-20 NOTE — Progress Notes (Signed)
NUTRITION NOTE  Per rounds this AM, pt agreeable to PEG at this time. Per RN note yesterday at 1758, pt consumed clear liquids and jello throughout the day. RD will follow-up tomorrow.     Jarome Matin, MS, RD, LDN Inpatient Clinical Dietitian Pager # [phone] After hours/weekend pager # [phone]

## 2015-11-21 ENCOUNTER — Inpatient Hospital Stay (HOSPITAL_COMMUNITY): Payer: Medicaid Other

## 2015-11-21 DIAGNOSIS — R1310 Dysphagia, unspecified: Secondary | ICD-10-CM

## 2015-11-21 LAB — CBC WITH DIFFERENTIAL/PLATELET
BASOS PCT: 0 %
Basophils Absolute: 0 10*3/uL (ref 0.0–0.1)
Eosinophils Absolute: 0.2 10*3/uL (ref 0.0–0.7)
Eosinophils Relative: 3 %
HEMATOCRIT: 25.2 % — AB (ref 36.0–46.0)
HEMOGLOBIN: 7.9 g/dL — AB (ref 12.0–15.0)
LYMPHS PCT: 18 %
Lymphs Abs: 1.4 10*3/uL (ref 0.7–4.0)
MCH: 30.6 pg (ref 26.0–34.0)
MCHC: 31.3 g/dL (ref 30.0–36.0)
MCV: 97.7 fL (ref 78.0–100.0)
MONOS PCT: 9 %
Monocytes Absolute: 0.7 10*3/uL (ref 0.1–1.0)
NEUTROS ABS: 5.7 10*3/uL (ref 1.7–7.7)
Neutrophils Relative %: 70 %
Platelets: 366 10*3/uL (ref 150–400)
RBC: 2.58 MIL/uL — ABNORMAL LOW (ref 3.87–5.11)
RDW: 22.2 % — ABNORMAL HIGH (ref 11.5–15.5)
WBC: 8 10*3/uL (ref 4.0–10.5)

## 2015-11-21 LAB — GLUCOSE, CAPILLARY
GLUCOSE-CAPILLARY: 131 mg/dL — AB (ref 65–99)
GLUCOSE-CAPILLARY: 118 mg/dL — AB (ref 65–99)
GLUCOSE-CAPILLARY: 124 mg/dL — AB (ref 65–99)
GLUCOSE-CAPILLARY: 121 mg/dL — AB (ref 65–99)
Glucose-Capillary: 100 mg/dL — ABNORMAL HIGH (ref 65–99)
Glucose-Capillary: 122 mg/dL — ABNORMAL HIGH (ref 65–99)

## 2015-11-21 LAB — PROTIME-INR
INR: 0.87
Prothrombin Time: 11.8 seconds (ref 11.4–15.2)

## 2015-11-21 MED ORDER — POTASSIUM CHLORIDE 20 MEQ/15ML (10%) PO SOLN
40.0000 meq | Freq: Once | ORAL | Status: AC
  Administered 2015-11-21: 40 meq
  Filled 2015-11-21: qty 30

## 2015-11-21 NOTE — Progress Notes (Signed)
Patient's films reviewed with Dr. Barbie Banner and still show significant ileus and gaseous distention of the colon.  We are unable to proceed at this time with g-tube placement until this resolves, if it does resolve.  Please reconsult when this is resolved.  Angela Page 12:02 PM 11/21/2015

## 2015-11-21 NOTE — Progress Notes (Signed)
Speech Language Pathology Treatment: Dysphagia;Passy Muir Speaking valve  Patient Details Name: Angela Page MRN: [id number] DOB: [birth year] Today's Date: 11/21/2015 Time: [id number] SLP Time Calculation (min) (ACUTE ONLY): 18 min  Assessment / Plan / Recommendation Clinical Impression  Note pt now with plan for PEG tube - currently tube feed running.  Pt is eager to have diet advanced per her conversation to SLP.  She has not received trays of clears and intake not documented. Per abdomen image performed earlier todaytoday, pt had distention of small bowel without obstruction.  Uncertain if MD would desire advancement in diet given plan for PEG tube.  Does MD desire for pt to continue clear diet for now and advancement after PEG?   She remains afebrile today with ability to clear ?pharyngeal secretions when SLP placed valve with cued cough/hocking.  Pt tolerated PMSV well today with all vitals stable and adequate expectoration of secretions during 10 minute session.  Session interrupted by radiology needing to conduct abdomen image.  Recommend continue PMSV as tolerated with full supervision.  Pt does report some days are better than others in terms of tolerance.  Suspect variance in work of breathing impacting pt's tolerance.  Will continue to follow to help maximize nutrition and PMSV tolerance.     HPI HPI: 45 yo female adm to [facility] with leukocytosis and AMS 10/31/15.  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  Pt with h/o C2-C7 fusion and C3-C6 osteophytic changes per imaging study.  Pt resides with her caregiver mom per RN.  Hospital coarse complicated by pt requiring intubation for respiratory compromise, found to have occlusion of right bronchus with collapse of RM and RL lungs concerning for aspiration, left lung findings also concerning for aspiration.  Pt was placed on vent 10/12-10/17 - reintubated evening of 10/17 and trach placed 10/23.  Pt has been off the vent today  full day and tolerating well.  Recent hospitalization for pna noted; pt coded and required defibrillation.  She also has h/o vent use for 3 months and trach *removed previously.  PMSV and swallow eval ordered.       SLP Plan  Continue with current plan of care     Recommendations  Diet recommendations:  (pt currently npo for potential peg placement, has tube feed running however) Liquids provided via: Teaspoon Medication Administration: Via alternative means Supervision: Full supervision/cueing for compensatory strategies Compensations: Slow rate;Small sips/bites;Multiple dry swallows after each bite/sip Postural Changes and/or Swallow Maneuvers: Seated upright 90 degrees;Upright 30-60 min after meal (as able with wounds)      Patient may use Passy-Muir Speech Valve:  (with RN and SLP) PMSV Supervision: Full MD: Please consider changing trach tube to : Cuffless         Oral Care Recommendations: Oral care BID Follow up Recommendations: Home health SLP;Skilled Nursing facility Plan: Continue with current plan of care       [place], [place] [facility] SLP [phone]

## 2015-11-21 NOTE — Consult Note (Signed)
  [place] Nurse wound follow up Family requested instructions for wound care for when discharged.  Wound care instructions typed in layman's terms, left at bedside.  No family at bedside this am when visited patient.  Informed primary nurse Wound care instructions were left at the beside for review with family when available.   Lenard Simmer WOC student Will not follow at this time.  Please re-consult if needed.  Domenic Moras RN BSN [place] Pager [phone]

## 2015-11-21 NOTE — Progress Notes (Signed)
Patient ID: Angela Page, female   DOB: [birth year], 45 y.o.   MRN: [id number]    Referring Physician(s): Kincaid  Supervising Physician: Marybelle Killings  Patient Status: [facility] - In-pt  Chief Complaint: dysphagia  Subjective: Patient on trach collar.  Can't find PMV to talk this morning.  TFs running.  Allergies: Erythromycin; Nitrofurantoin monohyd macro; and Penicillins  Medications: Prior to Admission medications   Medication Sig Start Date End Date Taking? Authorizing Provider  acetaminophen (TYLENOL) 500 MG tablet Take 1,000 mg by mouth every 6 (six) hours as needed for moderate pain.   Yes Historical Provider, MD  albuterol (PROVENTIL HFA;VENTOLIN HFA) 108 (90 BASE) MCG/ACT inhaler Inhale 2 puffs into the lungs every 6 (six) hours as needed. For shortness of breath    Yes Historical Provider, MD  albuterol (PROVENTIL) (2.5 MG/3ML) 0.083% nebulizer solution Take 2.5 mg by nebulization every 6 (six) hours as needed for wheezing or shortness of breath.   Yes Historical Provider, MD  baclofen (LIORESAL) 20 MG tablet Take 20 mg by mouth 3 (three) times daily.   Yes Historical Provider, MD  cholecalciferol (VITAMIN D) 1000 units tablet Take 1,000 Units by mouth daily.   Yes Historical Provider, MD  [employer] VITAMIN B12 1000 MCG tablet Take 1 tablet by mouth daily. 08/22/14  Yes Historical Provider, MD  dextromethorphan-guaiFENesin (MUCINEX DM) 30-600 MG 12hr tablet Take 1 tablet by mouth 2 (two) times daily. Patient taking differently: Take 1 tablet by mouth 2 (two) times daily as needed for cough.  03/01/15  Yes Debbe Odea, MD  doxycycline (VIBRA-TABS) 100 MG tablet Take 100 mg by mouth 2 (two) times daily.   Yes Historical Provider, MD  DULoxetine (CYMBALTA) 30 MG capsule Take 30 mg by mouth 2 (two) times daily. 01/06/15  Yes Historical Provider, MD  ENSURE (ENSURE) Take 237 mLs by mouth 2 (two) times daily.   Yes Historical Provider, MD  ferrous sulfate 325 (65 FE) MG tablet  Take 1 tablet (325 mg total) by mouth 3 (three) times daily. 08/04/14  Yes Janece Canterbury, MD  gabapentin (NEURONTIN) 400 MG capsule Take 400 mg by mouth 3 (three) times daily. 10/18/14  Yes Historical Provider, MD  ibuprofen (ADVIL,MOTRIN) 200 MG tablet Take 400 mg by mouth every 6 (six) hours as needed for moderate pain.    Yes Historical Provider, MD  lidocaine (LIDODERM) 5 % Place 1 patch onto the skin 2 (two) times daily as needed (pain).  04/04/15  Yes Historical Provider, MD  Melatonin 3 MG TABS Take 6 mg by mouth at bedtime as needed (for sleep).  10/11/15  Yes Historical Provider, MD  midodrine (PROAMATINE) 5 MG tablet Take 5 mg by mouth 3 (three) times daily with meals.   Yes Historical Provider, MD  oxycodone (OXY-IR) 5 MG capsule Take 5 mg by mouth every 4 (four) hours as needed for pain.   Yes Historical Provider, MD  silver sulfADIAZINE (SILVADENE) 1 % cream Apply 1 application topically every other day as needed (for wound care).   Yes Historical Provider, MD  tiZANidine (ZANAFLEX) 4 MG tablet Take 4 mg by mouth every 8 (eight) hours as needed. Muscle spasms. 12/13/14  Yes Historical Provider, MD  zinc oxide (BALMEX) 11.3 % CREA cream Apply 1 application topically 3 (three) times daily as needed (for skin redness/irritation).   Yes Historical Provider, MD  ciprofloxacin (CIPRO) 500 MG tablet Take 1 tablet (500 mg total) by mouth 2 (two) times daily. Patient not taking: Reported on  10/31/2015 10/20/15   Fredia Sorrow, MD  hydrocortisone (CORTEF) 10 MG tablet Take 30 mg tablet on Sept 8th, 2017 and taper down by 10 mg daily until completed Patient not taking: Reported on 10/31/2015 09/27/15   Theodis Blaze, MD  levofloxacin (LEVAQUIN) 500 MG tablet Take 1 tablet (500 mg total) by mouth daily. Take tablet on Sept 8th, 2017 Patient not taking: Reported on 10/31/2015 09/28/15   Theodis Blaze, MD  midodrine (PROAMATINE) 10 MG tablet Take 1 tablet (10 mg total) by mouth 3 (three) times daily with  meals. Patient not taking: Reported on 10/31/2015 09/27/15   Theodis Blaze, MD    Vital Signs: BP (!) 100/56   Pulse 80   Temp 97.5 F (36.4 C) (Oral)   Resp 20   Ht 5\' 2"  (1.575 m)   Wt 94 lb 5.7 oz (42.8 kg)   SpO2 99%   BMI 17.26 kg/m   Physical Exam: Abd: soft, NT, skinny Heart: regular Lungs: CTAB  Imaging: No results found.  Labs:  CBC:  Recent Labs  11/13/15 0550 11/13/15 1003 11/17/15 0826 11/21/15 0323  WBC 9.5 9.8 12.8* 8.0  HGB 7.0* 7.3* 9.4* 7.9*  HCT 21.2* 22.2* 29.0* 25.2*  PLT 452* 476* 485* 366    COAGS:  Recent Labs  01/31/15 2255 09/22/15 2018 10/31/15 2234 11/02/15 0447 11/12/15 0510 11/21/15 0323  INR 1.14 0.95 1.17 1.58 1.04 0.87  APTT 34 21* 32 41*  --   --     BMP:  Recent Labs  11/14/15 1720 11/15/15 0359 11/17/15 0826 11/18/15 2144  NA 138 139 138 140  K 4.1 3.3* 3.8 3.6  CL 102 102 103 103  CO2 30 31 28 31   GLUCOSE 89 94 117* 116*  BUN 10 8 9 12   CALCIUM 8.0* 7.9* 8.5* 8.9  CREATININE <0.30* <0.30* <0.30* <0.30*  GFRNONAA NOT CALCULATED NOT CALCULATED NOT CALCULATED NOT CALCULATED  GFRAA NOT CALCULATED NOT CALCULATED NOT CALCULATED NOT CALCULATED    LIVER FUNCTION TESTS:  Recent Labs  09/26/15 0327 10/20/15 1450 10/31/15 1544 11/01/15 0205  BILITOT 0.6 0.6 0.6 0.3  AST 18 14* 13* 14*  ALT 11* 9* 8* 7*  ALKPHOS 98 125 178* 147*  PROT 4.9* 6.7 5.4* 4.7*  ALBUMIN 1.7* 2.5* 1.9* 1.6*    Assessment and Plan: 1. Dysphagia secondary to respiratory failure -repeat abdominal film today to evaluate size of her colon.  If her colon is still dilated we may have to continue putting g-tube off as it may prevent of [name] from safely proceeding. -cont TFs for now  Electronically Signed: Icarus Partch E 11/21/2015, 8:49 AM   I spent a total of 15 Minutes at the the patient's bedside AND on the patient's hospital floor or unit, greater than 50% of which was counseling/coordinating care for dysphagia

## 2015-11-21 NOTE — Progress Notes (Signed)
Nutrition Follow-up  DOCUMENTATION CODES:   Non-severe (moderate) malnutrition in context of acute illness/injury, Underweight  INTERVENTION:  - Continue Vital 1.2 @ 65 mL/hr.  - Continue free water flush of 100 mL TID. - Continue PEPuP protocol. - RD will follow-up 11/3.  NUTRITION DIAGNOSIS:   Inadequate oral intake related to inability to eat as evidenced by NPO status. -ongoing except sips of clears at this time.   GOAL:   Patient will meet greater than or equal to 90% of their needs -met with TF alone.  MONITOR:   TF tolerance, Weight trends, Labs, Skin, I & O's, Other (Comment) (Ability to advance diet)  ASSESSMENT:   45 y.o. 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 Presented with worsening confusion status decreased by mouth intake foul-smelling decubitus ulcer. Apparently has been more sedated than usual at home; family did endorse that patient may have possibly received more narcotics than usual. Family also states that she has had decreased by mouth intake. Mother was very concerned that patient developed lung cancer based on the fact that her father died of it and want patient to be tested extensively. In ED patient required Narcan 2 and responded: suggestive of opioid overdose contributing to oversedation  11/1 Pt continues with NGT and Vital 1.2 @ 65 mL/hr which is providing 1872 kcal, 117 grams of protein, and 1265 mL free water. Also receiving 100 mL free water TID. Pt working with SLP with PMV at time of RD visit. Pt denies any nutrition-related questions or concerns at this time. IR has been consulted for PEG placement; note from this AM from IR reviewed. Will monitor for PEG placement and initiation of TF via  PEG. Weight stable from yesterday.  Medications reviewed; PRN Dulcolax, 100 mg Colace BID, sliding scale Novolog, PRN IV Zofran, PRN Senokot, 40 mEq KCl per NGT x1 dose today.  Labs reviewed; CBGs: 118 and 131 mg/dL this AM.    10/30 - Pt continues with NGT.  - SLP note from 10/27 PM reviewed.  - SLP working with pt this AM at time of RD visit.  - Spoke with SLP who reports plan for CLD via teaspoon starting today.  - Will monitor for possible repeat swallow evaluation versus plan for PEG placement.  - Order in place for Vital 1.2 @ 65 mL/hr.  - Weight -13.9 kg since 10/27 assessment.  - Estimated nutrition needs adjusted slightly; needs remain elevated d/t multiple stage 4 wounds.  - At time of RD visit, BP: 97/53 and MAP: 66.   10/27 - Pt continues with trach collar and SLP worked with pt with PMV yesterday.  - SLP also trialed POs yesterday and recommended ice chips only.  - She is currently receiving TF at goal rate via NGT: Vital 1.2 @ 65 mL/hr.  - Free water flush remains ordered but not programmed into pump at this time.  - Per Dr. Anastasia Pall note this AM, hopeful for diet advancement, calorie count with diet advancement to assess if PEG can be avoided.  - Weight -1.2 kg from yesterday.    Diet Order:  Diet NPO time specified Except for: Sips with Meds  Skin: Stage 4 pressure injuries to coccyx and bilateral buttocks, Stage 1 pressure injuries to mid, upper, bilateral back and L shoulder, Unstageable wound to cervical area.  Last BM:  10/29  Height:   Ht Readings from Last 1 Encounters:  11/07/15 _0  (1.575 m)    Weight:   Wt Readings from Last 1 Encounters:  11/21/15 94 lb 5.7 oz (42.8 kg)    Ideal Body Weight:  47.5 kg  BMI:  Body mass index is 17.26 kg/m.  Estimated Nutritional Needs:   Kcal:  1816-2043 (40-45 kcal/kg)  Protein:  100-114 grams (2.2-2.5 grams/kg)  Fluid:  >/= 1.8 L/day  EDUCATION NEEDS:   No education needs identified at  this time    Jarome Matin, MS, RD, LDN Inpatient Clinical Dietitian Pager # [phone] After hours/weekend pager # [phone]

## 2015-11-21 NOTE — Progress Notes (Signed)
PROGRESS NOTE    Angela Page  [id number] DOB: Sep 03, 1970 DOA: 10/31/2015 PCP: PROVIDER NOT IN SYSTEM   Brief Narrative: 45 y.o. female with medical history significant of traumatic spinal injury with paraplegia s/p traumatic fall with C2-7 posterior fusion 10/08/13 , recurrent UTI, decubitus ulcer, malnutrition, recurrent sepsis, Sick sinus syndrome status post Medtronic dual Chamber Pacemaker placement 11/03/13, presented with worsening confusion, decreased by mouth intake, foul-smelling decubitus ulcer.  She developed progressive hypotension and respiratory failure from HCAP and a UTI leading to septic shock. Completed antibiotics course. Patient is extubated. Evaluation for nutrition ongoing. CCM transferred to [facility] on 11/17/2015, on 17th days of inpatient hospitalization.  Today, patient agreed for gastric tube placement. Meantime, she likes to continue jello or clear diet as tolerated. I spoke with Dr. Loreta Ave from IR for G tube placement.  Assessment & Plan:   #Acute hypoxic/hypercapnic respiratory failure secondary to aspiration pna/uti/septic shock/metabolic encephalopathy Patient was admitted on 10/11 with altered mental status , sepsis from uit/pna, the next day she was put on pressor and intubated due to worsening of mental status and developed septic shock required pressure.  She failed extubation and had to be reintubated on 10/17night, s/p trach on 10/23,   Improved. Patient completed antibiotics course as per medical record Pulmonary evaluation and treatment ongoing. Pulm recommended trach OK to change to #6 cuffless prior to discharge -continue supportive care. Suctions and trach care -Speech and swallow team, dietary consult appreciated.The largest aspiration risk appeared to be esophageal. Hopefully, after placement of gastric tube we will be able to discharge patient home with home care. Patient is declining SNF at this time.  #Colonic distension/ileus: patient denies  abdominal pain, no n/v, tolerating tube feeds. Report + flatus and loose bm,  Will keep k>4, mag>2,  kub ordered by radiology, if comonis distension/ileus improved, will proceed with peg placement per radiology  # Protein-calorie malnutrition, severe (HCC): Speech and swallow evaluation note reviewed. The largest aspiration risk appeared to be esophageal. Discussed with the patient and she agreed with the PEG tube placement . Meantime she likes to continue Jell-O or clear liquid as tolerated.   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 bp stable on midodrine   Decubitus ulcer of sacral region, stage 4 (HCC)/chronic coccyx osteomyelitis. Wound care  Other hospital problems list:     Quadriplegia, C5-C7 incomplete [facility]): Continue baclofen and supportive care   Severe sepsis (HCC)     Lower urinary tract infectious disease   Acute encephalopathy   History of asthma and tobacco abuse   Ileus: Improved   Hyperglycemia due to steroid   DVT prophylaxis: Lovenox subcutaneous Code Status: Full code Family Communication: No family present at bedside Disposition Plan: Currently at stepdown. Likely discharge home versus nursing home in 1-2 days after gastric tube placement and with tube feed.  Consultants:   Pulmonary critical care.  Interventional radiologist  Palliative care  Procedures: Lt femoral aline 10/13 >> 10/14 ETT 10/13 >> 10/17, 10/18 >>  Lt IJ CVL 10/13 >> 10/26  SIGNIFICANT EVENTS: 10/11  Admit 10/13  VDRF, pressors, bronch 10/15  Vasopressors off  10/16  Fever, weaning on PSV 10/5 10/17  Extubated, failed and reintubated overnight 10/20  Weaning on PSV, plan for trach 10/23 10/23 Tracheostomy  Antimicrobials: Vancomycin 10/11 >> 10/15 Levaquin 10/11 >> 10/12 Aztreonam 10/11 >> 10/12  Diflucan 10/12 >> 10/14 Primaxin 10/12 >> 10/22  STUDIES:  CT chest 10/12 >> occlusion of Rt bronchus intermedius, basilar consolidation, small Rt  effusion Bronchoscopy 10/13 >> significant purulent secretions airways CT Pelvis 10/16 >> no evidence of emphysematous fasciitis along the perineum, stage IV decubitus ulcers along the sacrum / ischial tuberosities associated with chronic osteomyelitis CT abdomen 10/24 > ileus, anasarca  CULTURES: Blood 10/11 >> neg Urine 10/11 >> Acinetobacter, yeast Sputum 10/13 >> neg  Subjective: patient was seen and examined at bedside. Patient reported feeling well.  On 5liter oxygen through trach, denies pain, tolerating tube feeds,  Denied fever, chills, nausea, vomiting, abdominal pain, chest pain, shortness of breath.  Objective: Vitals:   11/21/15 0437 11/21/15 0500 11/21/15 0600 11/21/15 0745  BP: (!) 92/56  (!) 100/56   Pulse:      Resp: 14  20   Temp:      TempSrc:      SpO2: 98%  100% 99%  Weight:  42.8 kg (94 lb 5.7 oz)    Height:        Intake/Output Summary (Last 24 hours) at 11/21/15 0825 Last data filed at 11/21/15 0600  Gross per 24 hour  Intake             1625 ml  Output             2050 ml  Net             -425 ml   Filed Weights   11/19/15 0400 11/20/15 0500 11/21/15 0500  Weight: 45.4 kg (100 lb 1.4 oz) 43.3 kg (95 lb 7.4 oz) 42.8 kg (94 lb 5.7 oz)    Examination:  General exam:Cachectic female with NG tube for feeding, not in distress Neck: Trachea stoma site clean with no bleeding. Respiratory system: diminished at basis, no wheeze Cardiovascular system: Regular rate rhythm, S1 and S2 normal. No pedal edema, s/p pacemaker Gastrointestinal system: Bowel sound positive, abdomen soft, nontender and not distended Central nervous system: Alert, awake, following commands and oriented. Extremities: paraplegia Skin: chronic sacral decubitus ulcer Psychiatry: Judgement and insight appear normal. Mood & affect appropriate.     Data Reviewed: I have personally reviewed following labs and imaging studies  CBC:  Recent Labs Lab 11/17/15 0826 11/21/15 0323   WBC 12.8* 8.0  NEUTROABS  --  5.7  HGB 9.4* 7.9*  HCT 29.0* 25.2*  MCV 95.4 97.7  PLT 485* 465   Basic Metabolic Panel:  Recent Labs Lab 11/14/15 1720 11/15/15 0359 11/17/15 0826 11/18/15 2144  NA 138 139 138 140  K 4.1 3.3* 3.8 3.6  CL 102 102 103 103  CO2 '30 31 28 31  '$ GLUCOSE 89 94 117* 116*  BUN '10 8 9 12  '$ CREATININE <0.30* <0.30* <0.30* <0.30*  CALCIUM 8.0* 7.9* 8.5* 8.9  MG  --   --  2.2  --    GFR: CrCl cannot be calculated (This lab value cannot be used to calculate CrCl because it is not a number: <0.30). Liver Function Tests: No results for input(s): AST, ALT, ALKPHOS, BILITOT, PROT, ALBUMIN in the last 168 hours. No results for input(s): LIPASE, AMYLASE in the last 168 hours. No results for input(s): AMMONIA in the last 168 hours. Coagulation Profile:  Recent Labs Lab 11/21/15 0323  INR 0.87   Cardiac Enzymes: No results for input(s): CKTOTAL, CKMB, CKMBINDEX, TROPONINI in the last 168 hours. BNP (last 3 results) No results for input(s): PROBNP in the last 8760 hours. HbA1C: No results for input(s): HGBA1C in the last 72 hours. CBG:  Recent Labs Lab 11/20/15 1217 11/20/15 1640 11/20/15 1937 11/20/15  2310 11/21/15 0345  GLUCAP 114* 119* 99 109* 131*   Lipid Profile: No results for input(s): CHOL, HDL, LDLCALC, TRIG, CHOLHDL, LDLDIRECT in the last 72 hours. Thyroid Function Tests: No results for input(s): TSH, T4TOTAL, FREET4, T3FREE, THYROIDAB in the last 72 hours. Anemia Panel: No results for input(s): VITAMINB12, FOLATE, FERRITIN, TIBC, IRON, RETICCTPCT in the last 72 hours. Sepsis Labs: No results for input(s): PROCALCITON, LATICACIDVEN in the last 168 hours.  No results found for this or any previous visit (from the past 240 hour(s)).       Radiology Studies: No results found.      Scheduled Meds: . baclofen  10 mg Oral TID  . chlorhexidine gluconate (MEDLINE KIT)  15 mL Mouth Rinse BID  . clonazepam  0.5 mg Oral BID  .  collagenase   Topical Daily  . docusate  100 mg Oral BID  . enoxaparin (LOVENOX) injection  40 mg Subcutaneous QHS  . free water  100 mL Per Tube Q8H  . gabapentin  300 mg Per Tube TID  . insulin aspart  0-9 Units Subcutaneous Q4H  . ipratropium-albuterol  3 mL Nebulization Q6H  . mouth rinse  15 mL Mouth Rinse QID  . midodrine  10 mg Oral TID WC  . ranitidine  150 mg Per Tube Q12H  . sodium chloride flush  3 mL Intravenous Q12H  . venlafaxine  75 mg Per Tube BID  . zolpidem  5 mg Oral QHS   Continuous Infusions: . feeding supplement (VITAL AF 1.2 CAL) 1,000 mL (11/20/15 2330)     LOS: 21 days    Time spent: 66mnutes.    XFlorencia Reasons MD PhD [facility] Pager [phone] If 7PM-7AM, please contact night-coverage [email or web address] Password [email or web address] 11/21/2015, 8:25 AM

## 2015-11-21 NOTE — Progress Notes (Signed)
Date:  November 21, 2015 Chart reviewed for concurrent status and case management needs. Will continue to follow the patient for status change: Discharge Planning: following for needs TCT-Mother [name] meeting set for 1300 on [id number] to discuss home care realities. Velva Harman, BSN, [place], [place]

## 2015-11-22 ENCOUNTER — Inpatient Hospital Stay (HOSPITAL_COMMUNITY): Payer: Medicaid Other

## 2015-11-22 LAB — GLUCOSE, CAPILLARY
GLUCOSE-CAPILLARY: 144 mg/dL — AB (ref 65–99)
GLUCOSE-CAPILLARY: 127 mg/dL — AB (ref 65–99)
GLUCOSE-CAPILLARY: 100 mg/dL — AB (ref 65–99)
Glucose-Capillary: 98 mg/dL (ref 65–99)
Glucose-Capillary: 132 mg/dL — ABNORMAL HIGH (ref 65–99)

## 2015-11-22 LAB — CBC
HCT: 27.6 % — ABNORMAL LOW (ref 36.0–46.0)
Hemoglobin: 8.7 g/dL — ABNORMAL LOW (ref 12.0–15.0)
MCH: 31 pg (ref 26.0–34.0)
MCHC: 31.5 g/dL (ref 30.0–36.0)
MCV: 98.2 fL (ref 78.0–100.0)
Platelets: 381 10*3/uL (ref 150–400)
RBC: 2.81 MIL/uL — ABNORMAL LOW (ref 3.87–5.11)
RDW: 21.8 % — AB (ref 11.5–15.5)
WBC: 8.3 10*3/uL (ref 4.0–10.5)

## 2015-11-22 LAB — BASIC METABOLIC PANEL
Anion gap: 10 (ref 5–15)
BUN: 20 mg/dL (ref 6–20)
CALCIUM: 10 mg/dL (ref 8.9–10.3)
CHLORIDE: 98 mmol/L — AB (ref 101–111)
CO2: 31 mmol/L (ref 22–32)
Glucose, Bld: 124 mg/dL — ABNORMAL HIGH (ref 65–99)
Potassium: 4.9 mmol/L (ref 3.5–5.1)
SODIUM: 139 mmol/L (ref 135–145)

## 2015-11-22 LAB — MAGNESIUM: MAGNESIUM: 2.4 mg/dL (ref 1.7–2.4)

## 2015-11-22 MED ORDER — SENNOSIDES 8.8 MG/5ML PO SYRP
5.0000 mL | ORAL_SOLUTION | Freq: Two times a day (BID) | ORAL | Status: DC
  Administered 2015-11-22 – 2015-11-27 (×11): 5 mL
  Filled 2015-11-22 (×12): qty 5

## 2015-11-22 MED ORDER — POLYETHYLENE GLYCOL 3350 17 G PO PACK: 17.0000 g | PACK | Freq: Every day | ORAL | Status: DC

## 2015-11-22 MED ORDER — SENNOSIDES-DOCUSATE SODIUM 8.6-50 MG PO TABS
1.0000 | ORAL_TABLET | Freq: Two times a day (BID) | ORAL | Status: DC
Start: 2015-11-22 — End: 2015-11-22

## 2015-11-22 MED ORDER — POLYETHYLENE GLYCOL 3350 17 G PO PACK
17.0000 g | PACK | Freq: Every day | ORAL | Status: DC
Start: 2015-11-22 — End: 2015-11-27
  Administered 2015-11-22 – 2015-11-27 (×6): 17 g
  Filled 2015-11-22 (×6): qty 1

## 2015-11-22 MED ORDER — ENOXAPARIN SODIUM 30 MG/0.3ML ~~LOC~~ SOLN
30.0000 mg | Freq: Every day | SUBCUTANEOUS | Status: DC
  Administered 2015-11-22: 30 mg via SUBCUTANEOUS
  Filled 2015-11-22: qty 0.3

## 2015-11-22 NOTE — Progress Notes (Signed)
Date:  November 22, 2015 Chart reviewed for concurrent status and case management needs. Will continue to follow the patient for status change: Discharge Planning: following for needs family meeting with patient and mother to be held today at 1 pm to discuss possibility and reality of patient going home and being care for in the home by the mother due to new trach and o2 requirements. Expected discharge date: [id number] Velva Harman, [place], [place], [place]

## 2015-11-22 NOTE — Progress Notes (Signed)
Date:  November 22, 2015 Discharge Planning: meeting with Mother who is the caregiver in the home and Emilda Beyah from [facility]/Mother was being trained on the care of the trache and suctioning when we entered the room.  Discussion of home needs-RN coverage and dme needs done.  Mother will have RN in the home from 10pm-10am for night coverage and in the afternoons for 1-2 hours for personal needs of the daughter/Mother states she does feel comfort with caring for the trache and suctioning during the day but does understand that she can call [facility] at any time for direction and help.  Patient want to advance diet in order to be able to eat along with the g-tube for feeds as needed.  [facility] will do the d[facility] as mother already has had them in the house for dme needs of the family.  Mother and patient are in agreement with the plan at the present time and Mother has phone numbers of D. Berline Lopes and myself for any questions or concerns.  Velva Harman, BSN, [place], [place]

## 2015-11-22 NOTE — Progress Notes (Signed)
Date:  November 22, 2015 Discharge Planning:  TCT- Jermaine with [facility]-notified that patient will be going home with o2, trache and trache care dme/ will follow for discharge. Velva Harman, BSN, [place], [place]

## 2015-11-22 NOTE — Progress Notes (Signed)
PROGRESS NOTE    Angela Page  QZR:[id number] DOB: 1970/11/28 DOA: 10/31/2015 PCP: PROVIDER NOT IN SYSTEM   Brief Narrative: 45 y.o. female with medical history significant of traumatic spinal injury with paraplegia s/p traumatic fall with C2-7 posterior fusion 10/08/13 , recurrent UTI, decubitus ulcer, malnutrition, recurrent sepsis, Sick sinus syndrome status post [employer] dual Chamber Pacemaker placement 11/03/13, presented with worsening confusion, decreased by mouth intake, foul-smelling decubitus ulcer.  She developed progressive hypotension and respiratory failure from HCAP and a UTI leading to septic shock. Completed antibiotics course. Patient is extubated. Evaluation for nutrition ongoing. CCM transferred to [facility] on 11/17/2015, on 17th days of inpatient hospitalization.  Today, patient agreed for gastric tube placement. Meantime, she likes to continue jello or clear diet as tolerated. I spoke with Dr. Earleen Newport from IR for G tube placement.  Assessment & Plan:   #Acute hypoxic/hypercapnic respiratory failure secondary to aspiration pna/ACINETOBACTER UTI/septic shock/metabolic encephalopathy Patient was admitted on 10/11 with altered mental status , sepsis from uit/pna, the next day she was put on pressor and intubated due to worsening of mental status and developed septic shock required pressure.  She failed extubation and had to be reintubated on 10/17night, s/p trach on 10/23,   Improved. Patient completed antibiotics course as per medical record Pulmonary evaluation and treatment ongoing. Pulm recommended trach OK to change to #6 cuffless prior to discharge -continue supportive care. Suctions and trach care -Speech and swallow team, dietary consult appreciated.The largest aspiration risk appeared to be esophageal. Hopefully, after placement of gastric tube we will be able to discharge patient home with home care. Patient is declining SNF at this time.  #ileus: patient denies  abdominal pain, no n/v, tolerating tube feeds. Report + flatus, no bm last 24hs  keep k>4, mag>2,  kub daily, if ileus improved, will proceed with peg placement per radiology  # Protein-calorie malnutrition, severe ([place]): Speech and swallow evaluation note reviewed. The largest aspiration risk appeared to be esophageal. Discussed with the patient and she agreed with the PEG tube placement . Meantime she likes to continue Jell-O or clear liquid as tolerated.   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 bp stable on midodrine   Decubitus ulcer of sacral region, stage 4 (HCC)/chronic coccyx osteomyelitis. Wound care  Other hospital problems list:     Quadriplegia, C5-C7 incomplete [facility]): bedridden, Continue baclofen and supportive care   Severe sepsis ([place])     Lower urinary tract infectious disease   Acute encephalopathy   History of asthma and tobacco abuse   Ileus: Improved   Hyperglycemia due to steroid   DVT prophylaxis: Lovenox subcutaneous Code Status: Full code Family Communication: No family present at bedside Disposition Plan: Currently at stepdown. Likely discharge home versus nursing home in 1-2 days after gastric tube placement and with tube feed.  Consultants:   Pulmonary critical care.  Interventional radiologist  Palliative care  Procedures: Lt femoral aline 10/13 >> 10/14 ETT 10/13 >> 10/17, 10/18 >>  Lt IJ CVL 10/13 >> 10/26  SIGNIFICANT EVENTS: 10/11  Admit 10/13  VDRF, pressors, bronch 10/15  Vasopressors off  10/16  Fever, weaning on PSV 10/5 10/17  Extubated, failed and reintubated overnight 10/20  Weaning on PSV, plan for trach 10/23 10/23 Tracheostomy  Antimicrobials: Vancomycin 10/11 >> 10/15 Levaquin 10/11 >> 10/12 Aztreonam 10/11 >> 10/12  Diflucan 10/12 >> 10/14 Primaxin 10/12 >> 10/22  STUDIES:  CT chest 10/12 >> occlusion of Rt bronchus intermedius, basilar consolidation, small Rt effusion Bronchoscopy  10/13 >> significant  purulent secretions airways CT Pelvis 10/16 >> no evidence of emphysematous fasciitis along the perineum, stage IV decubitus ulcers along the sacrum / ischial tuberosities associated with chronic osteomyelitis CT abdomen 10/24 > ileus, anasarca  CULTURES: Blood 10/11 >> neg Urine 10/11 >> Acinetobacter, yeast Sputum 10/13 >> neg  Subjective:  patient was seen and examined at bedside. Patient reported feeling well.  On 5liter oxygen through trach, denies pain, tolerating tube feeds,  Denied fever, nausea, vomiting, abdominal pain,  no bm in last 24hrs  Objective: Vitals:   11/22/15 0330 11/22/15 0344 11/22/15 0400 11/22/15 0737  BP:   106/64   Pulse:      Resp:   13   Temp: 97.9 F (36.6 C)     TempSrc: Oral     SpO2:   96% 99%  Weight:  39.2 kg (86 lb 6.7 oz)    Height:        Intake/Output Summary (Last 24 hours) at 11/22/15 0824 Last data filed at 11/22/15 0600  Gross per 24 hour  Intake           1592.5 ml  Output             3400 ml  Net          -1807.5 ml   Filed Weights   11/21/15 0500 11/22/15 0045 11/22/15 0344  Weight: 42.8 kg (94 lb 5.7 oz) 39.2 kg (86 lb 6.7 oz) 39.2 kg (86 lb 6.7 oz)    Examination:  General exam:Cachectic female with NG tube for feeding, not in distress Neck: Trachea stoma site clean with no bleeding. Respiratory system: diminished at basis, no wheeze Cardiovascular system: Regular rate rhythm, S1 and S2 normal. No pedal edema, s/p pacemaker Gastrointestinal system: Bowel sound positive, abdomen soft, nontender and not distended Central nervous system: Alert, awake, following commands and oriented. Extremities: paraplegia Skin: chronic sacral decubitus ulcer Psychiatry: Judgement and insight appear normal. Mood & affect appropriate.     Data Reviewed: I have personally reviewed following labs and imaging studies  CBC:  Recent Labs Lab 11/17/15 0826 11/21/15 0323 11/22/15 0309  WBC 12.8* 8.0 8.3  NEUTROABS  --  5.7  --     HGB 9.4* 7.9* 8.7*  HCT 29.0* 25.2* 27.6*  MCV 95.4 97.7 98.2  PLT 485* 366 291   Basic Metabolic Panel:  Recent Labs Lab 11/17/15 0826 11/18/15 2144 11/22/15 0309  NA 138 140 139  K 3.8 3.6 4.9  CL 103 103 98*  CO2 '28 31 31  '$ GLUCOSE 117* 116* 124*  BUN '9 12 20  '$ CREATININE <0.30* <0.30* <0.30*  CALCIUM 8.5* 8.9 10.0  MG 2.2  --  2.4   GFR: CrCl cannot be calculated (This lab value cannot be used to calculate CrCl because it is not a number: <0.30). Liver Function Tests: No results for input(s): AST, ALT, ALKPHOS, BILITOT, PROT, ALBUMIN in the last 168 hours. No results for input(s): LIPASE, AMYLASE in the last 168 hours. No results for input(s): AMMONIA in the last 168 hours. Coagulation Profile:  Recent Labs Lab 11/21/15 0323  INR 0.87   Cardiac Enzymes: No results for input(s): CKTOTAL, CKMB, CKMBINDEX, TROPONINI in the last 168 hours. BNP (last 3 results) No results for input(s): PROBNP in the last 8760 hours. HbA1C: No results for input(s): HGBA1C in the last 72 hours. CBG:  Recent Labs Lab 11/21/15 1220 11/21/15 1546 11/21/15 1954 11/21/15 2351 11/22/15 0335  GLUCAP 124* 121* 122*  100* 144*   Lipid Profile: No results for input(s): CHOL, HDL, LDLCALC, TRIG, CHOLHDL, LDLDIRECT in the last 72 hours. Thyroid Function Tests: No results for input(s): TSH, T4TOTAL, FREET4, T3FREE, THYROIDAB in the last 72 hours. Anemia Panel: No results for input(s): VITAMINB12, FOLATE, FERRITIN, TIBC, IRON, RETICCTPCT in the last 72 hours. Sepsis Labs: No results for input(s): PROCALCITON, LATICACIDVEN in the last 168 hours.  No results found for this or any previous visit (from the past 240 hour(s)).       Radiology Studies: Dg Abd 1 View  Result Date: 11/22/2015 CLINICAL DATA:  45 y/o  F; ileus. EXAM: ABDOMEN - 1 VIEW COMPARISON:  11/21/2015 abdomen radiograph FINDINGS: Enteric tube tip projects over mid stomach. Retained contrast within the colon. Persistent  dilated loops of small bowel mildly improved. No acute osseous abnormality is evident. IMPRESSION: Persistently dilated loops of small bowel mildly improved and retained contrast within the colon. Electronically Signed   By: Kristine Garbe M.D.   On: 11/22/2015 05:57   Dg Abd Portable 1v  Result Date: 11/21/2015 CLINICAL DATA:  All follow-up ileus. EXAM: PORTABLE ABDOMEN - 1 VIEW COMPARISON:  11/16/2015 FINDINGS: Feeding tube with the tip projecting over the stomach. There is gaseous distention of small bowel and colon. There is oral contrast material which has traversed the small bowel and is canal present within the colon. There is no evidence of pneumoperitoneum, portal venous gas or pneumatosis. There are no pathologic calcifications along the expected course of the ureters. The osseous structures are unremarkable. IMPRESSION: 1. Feeding tube with the tip projecting over the stomach. 2. Gaseous distention of small bowel colon without bowel obstruction. Contrast seen within the small bowel on the prior exam of 11/16/2015 is not present within the colon. Electronically Signed   By: Kathreen Devoid   On: 11/21/2015 10:25        Scheduled Meds: . baclofen  10 mg Oral TID  . chlorhexidine gluconate (MEDLINE KIT)  15 mL Mouth Rinse BID  . clonazepam  0.5 mg Oral BID  . collagenase   Topical Daily  . docusate  100 mg Oral BID  . enoxaparin (LOVENOX) injection  40 mg Subcutaneous QHS  . free water  100 mL Per Tube Q8H  . gabapentin  300 mg Per Tube TID  . insulin aspart  0-9 Units Subcutaneous Q4H  . ipratropium-albuterol  3 mL Nebulization Q6H  . mouth rinse  15 mL Mouth Rinse QID  . midodrine  10 mg Oral TID WC  . polyethylene glycol  17 g Per Tube Daily  . ranitidine  150 mg Per Tube Q12H  . senna-docusate  1 tablet Per Tube BID  . sodium chloride flush  3 mL Intravenous Q12H  . venlafaxine  75 mg Per Tube BID  . zolpidem  5 mg Oral QHS   Continuous Infusions: . feeding  supplement (VITAL AF 1.2 CAL) 1,000 mL (11/22/15 0344)     LOS: 22 days    Time spent: 16mnutes.    XFlorencia Reasons MD PhD [facility] Pager [phone] If 7PM-7AM, please contact night-coverage [email or web address] Password [email or web address]11/02/2015, 8:24 AM

## 2015-11-22 NOTE — Progress Notes (Signed)
Pharmacy - Brief Note (enoxaparin VTE prophylaxis)  Based on decreasing TBW (now < 40kg), adjust enoxaparin to 30mg  SQ q24h  Doreene Eland, PharmD, BCPS.   Pager: [phone] 11/22/2015 11:12 AM

## 2015-11-23 ENCOUNTER — Inpatient Hospital Stay (HOSPITAL_COMMUNITY): Payer: Medicaid Other

## 2015-11-23 ENCOUNTER — Encounter (HOSPITAL_COMMUNITY): Payer: Self-pay | Admitting: Physician Assistant

## 2015-11-23 DIAGNOSIS — Z936 Other artificial openings of urinary tract status: Secondary | ICD-10-CM

## 2015-11-23 DIAGNOSIS — R9431 Abnormal electrocardiogram [ECG] [EKG]: Secondary | ICD-10-CM

## 2015-11-23 DIAGNOSIS — Z931 Gastrostomy status: Secondary | ICD-10-CM

## 2015-11-23 DIAGNOSIS — Z430 Encounter for attention to tracheostomy: Secondary | ICD-10-CM

## 2015-11-23 HISTORY — PX: IR GENERIC HISTORICAL: IMG1180011

## 2015-11-23 HISTORY — PX: GASTROSTOMY: SHX151

## 2015-11-23 LAB — BASIC METABOLIC PANEL
ANION GAP: 11 (ref 5–15)
BUN: 27 mg/dL — ABNORMAL HIGH (ref 6–20)
CO2: 31 mmol/L (ref 22–32)
Calcium: 9.9 mg/dL (ref 8.9–10.3)
Chloride: 97 mmol/L — ABNORMAL LOW (ref 101–111)
Creatinine, Ser: 0.37 mg/dL — ABNORMAL LOW (ref 0.44–1.00)
Glucose, Bld: 100 mg/dL — ABNORMAL HIGH (ref 65–99)
POTASSIUM: 4.5 mmol/L (ref 3.5–5.1)
SODIUM: 139 mmol/L (ref 135–145)

## 2015-11-23 LAB — GLUCOSE, CAPILLARY
GLUCOSE-CAPILLARY: 108 mg/dL — AB (ref 65–99)
GLUCOSE-CAPILLARY: 122 mg/dL — AB (ref 65–99)
GLUCOSE-CAPILLARY: 99 mg/dL (ref 65–99)
Glucose-Capillary: 87 mg/dL (ref 65–99)
Glucose-Capillary: 90 mg/dL (ref 65–99)
Glucose-Capillary: 86 mg/dL (ref 65–99)

## 2015-11-23 LAB — ECHOCARDIOGRAM COMPLETE
Height: 62 in
Weight: 1382.73 oz

## 2015-11-23 MED ORDER — OSMOLITE 1.2 CAL PO LIQD
1000.0000 mL | ORAL | Status: DC
  Administered 2015-11-24 – 2015-11-26 (×3): 1000 mL

## 2015-11-23 MED ORDER — LIDOCAINE HCL 1 % IJ SOLN
INTRAMUSCULAR | Status: AC
  Filled 2015-11-23: qty 20

## 2015-11-23 MED ORDER — FENTANYL CITRATE (PF) 100 MCG/2ML IJ SOLN
INTRAMUSCULAR | Status: AC | PRN
  Administered 2015-11-23: 50 ug via INTRAVENOUS

## 2015-11-23 MED ORDER — ENOXAPARIN SODIUM 30 MG/0.3ML ~~LOC~~ SOLN
30.0000 mg | Freq: Every day | SUBCUTANEOUS | Status: DC
  Administered 2015-11-24 – 2015-11-26 (×3): 30 mg via SUBCUTANEOUS
  Filled 2015-11-23 (×3): qty 0.3

## 2015-11-23 MED ORDER — MIDAZOLAM HCL 2 MG/2ML IJ SOLN
INTRAMUSCULAR | Status: AC
  Filled 2015-11-23: qty 4

## 2015-11-23 MED ORDER — PRO-STAT SUGAR FREE PO LIQD
30.0000 mL | Freq: Two times a day (BID) | ORAL | Status: DC
  Administered 2015-11-23 – 2015-11-27 (×7): 30 mL
  Filled 2015-11-23 (×7): qty 30

## 2015-11-23 MED ORDER — VANCOMYCIN HCL IN DEXTROSE 1-5 GM/200ML-% IV SOLN
1000.0000 mg | Freq: Once | INTRAVENOUS | Status: AC
Start: 2015-11-23 — End: 2015-11-23
  Administered 2015-11-23: 1000 mg via INTRAVENOUS
  Filled 2015-11-23: qty 200

## 2015-11-23 MED ORDER — FENTANYL CITRATE (PF) 100 MCG/2ML IJ SOLN
INTRAMUSCULAR | Status: AC
  Filled 2015-11-23: qty 4

## 2015-11-23 MED ORDER — GLUCAGON HCL RDNA (DIAGNOSTIC) 1 MG IJ SOLR
INTRAMUSCULAR | Status: AC
  Filled 2015-11-23: qty 1

## 2015-11-23 MED ORDER — IOPAMIDOL (ISOVUE-300) INJECTION 61%
10.0000 mL | Freq: Once | INTRAVENOUS | Status: AC | PRN
  Administered 2015-11-23: 10 mL

## 2015-11-23 MED ORDER — LIDOCAINE HCL 1 % IJ SOLN
INTRAMUSCULAR | Status: AC | PRN
  Administered 2015-11-23: 5 mL

## 2015-11-23 MED ORDER — MIDAZOLAM HCL 2 MG/2ML IJ SOLN
INTRAMUSCULAR | Status: AC | PRN
  Administered 2015-11-23: 1 mg via INTRAVENOUS

## 2015-11-23 MED ORDER — MINERAL OIL RE ENEM
1.0000 | ENEMA | Freq: Once | RECTAL | Status: AC
  Administered 2015-11-23: 1 via RECTAL
  Filled 2015-11-23: qty 1

## 2015-11-23 NOTE — H&P (Signed)
Chief Complaint: Malnutrition Dysphagia  Referring Physician(s): Florencia Reasons  Supervising Physician: Corrie Mckusick  Patient Status: [facility] - In-pt  History of Present Illness: Angela Page is a 45 y.o. female with a medical history significant oftraumatic spinal injury with paraplegias/p traumatic fall with C2-7 posterior fusion 10/08/13 ,recurrent UTI, decubitus ulcer, malnutrition, recurrent sepsis, Sick sinus syndrome status post [employer] dual Chamber Pacemaker placement 11/03/13, presented with worsening confusion, decreased by mouth intake, foul-smelling decubitus ulcer.  She developed progressive hypotension and respiratory failure from HCAP and a UTI leading to septic shock. She completed antibiotics course.   She has a trach in place.  She has dysphagia and can only swallow jello and thin liquids.  We are asked to place a gastrostomy tube today.  She is agreeable. She is afebrile. WBC is normal  Past Medical History:  Diagnosis Date  . Asthma   . GERD (gastroesophageal reflux disease)   . Pacemaker   . Pleurisy   . Quadriparesis [facility])     Past Surgical History:  Procedure Laterality Date  . APPENDECTOMY    . CARDIAC SURGERY    . I&D EXTREMITY  10/28/2011   Procedure: IRRIGATION AND DEBRIDEMENT EXTREMITY;  Surgeon: Tennis Must, MD;  Location: [facility];  Service: Orthopedics;  Laterality: Right;  Irrigation and debridement right middle finger    Allergies: Erythromycin; Nitrofurantoin monohyd macro; and Penicillins  Medications: Prior to Admission medications   Medication Sig Start Date End Date Taking? Authorizing Provider  acetaminophen (TYLENOL) 500 MG tablet Take 1,000 mg by mouth every 6 (six) hours as needed for moderate pain.   Yes Historical Provider, MD  albuterol (PROVENTIL HFA;VENTOLIN HFA) 108 (90 BASE) MCG/ACT inhaler Inhale 2 puffs into the lungs every 6 (six) hours as needed. For shortness of breath    Yes Historical  Provider, MD  albuterol (PROVENTIL) (2.5 MG/3ML) 0.083% nebulizer solution Take 2.5 mg by nebulization every 6 (six) hours as needed for wheezing or shortness of breath.   Yes Historical Provider, MD  baclofen (LIORESAL) 20 MG tablet Take 20 mg by mouth 3 (three) times daily.   Yes Historical Provider, MD  cholecalciferol (VITAMIN D) 1000 units tablet Take 1,000 Units by mouth daily.   Yes Historical Provider, MD  [employer] VITAMIN B12 1000 MCG tablet Take 1 tablet by mouth daily. 08/22/14  Yes Historical Provider, MD  dextromethorphan-guaiFENesin (MUCINEX DM) 30-600 MG 12hr tablet Take 1 tablet by mouth 2 (two) times daily. Patient taking differently: Take 1 tablet by mouth 2 (two) times daily as needed for cough.  03/01/15  Yes Debbe Odea, MD  doxycycline (VIBRA-TABS) 100 MG tablet Take 100 mg by mouth 2 (two) times daily.   Yes Historical Provider, MD  DULoxetine (CYMBALTA) 30 MG capsule Take 30 mg by mouth 2 (two) times daily. 01/06/15  Yes Historical Provider, MD  ENSURE (ENSURE) Take 237 mLs by mouth 2 (two) times daily.   Yes Historical Provider, MD  ferrous sulfate 325 (65 FE) MG tablet Take 1 tablet (325 mg total) by mouth 3 (three) times daily. 08/04/14  Yes Janece Canterbury, MD  gabapentin (NEURONTIN) 400 MG capsule Take 400 mg by mouth 3 (three) times daily. 10/18/14  Yes Historical Provider, MD  ibuprofen (ADVIL,MOTRIN) 200 MG tablet Take 400 mg by mouth every 6 (six) hours as needed for moderate pain.    Yes Historical Provider, MD  lidocaine (LIDODERM) 5 % Place 1 patch onto the skin 2 (two) times daily as needed (pain).  04/04/15  Yes Historical Provider, MD  Melatonin 3 MG TABS Take 6 mg by mouth at bedtime as needed (for sleep).  10/11/15  Yes Historical Provider, MD  midodrine (PROAMATINE) 5 MG tablet Take 5 mg by mouth 3 (three) times daily with meals.   Yes Historical Provider, MD  oxycodone (OXY-IR) 5 MG capsule Take 5 mg by mouth every 4 (four) hours as needed for pain.   Yes Historical  Provider, MD  silver sulfADIAZINE (SILVADENE) 1 % cream Apply 1 application topically every other day as needed (for wound care).   Yes Historical Provider, MD  tiZANidine (ZANAFLEX) 4 MG tablet Take 4 mg by mouth every 8 (eight) hours as needed. Muscle spasms. 12/13/14  Yes Historical Provider, MD  zinc oxide (BALMEX) 11.3 % CREA cream Apply 1 application topically 3 (three) times daily as needed (for skin redness/irritation).   Yes Historical Provider, MD  ciprofloxacin (CIPRO) 500 MG tablet Take 1 tablet (500 mg total) by mouth 2 (two) times daily. Patient not taking: Reported on 10/31/2015 10/20/15   Fredia Sorrow, MD  hydrocortisone (CORTEF) 10 MG tablet Take 30 mg tablet on Sept 8th, 2017 and taper down by 10 mg daily until completed Patient not taking: Reported on 10/31/2015 09/27/15   Theodis Blaze, MD  levofloxacin (LEVAQUIN) 500 MG tablet Take 1 tablet (500 mg total) by mouth daily. Take tablet on Sept 8th, 2017 Patient not taking: Reported on 10/31/2015 09/28/15   Theodis Blaze, MD  midodrine (PROAMATINE) 10 MG tablet Take 1 tablet (10 mg total) by mouth 3 (three) times daily with meals. Patient not taking: Reported on 10/31/2015 09/27/15   Theodis Blaze, MD     Family History  Problem Relation Age of Onset  . Hypertension Maternal Uncle     Social History   Social History  . Marital status: Divorced    Spouse name: N/A  . Number of children: N/A  . Years of education: N/A   Social History Main Topics  . Smoking status: Former Smoker    Packs/day: 1.00    Years: 25.00    Types: Cigarettes  . Smokeless tobacco: Never Used  . Alcohol use Yes     Comment: 2 x weekly  . Drug use: No  . Sexual activity: No   Other Topics Concern  . None   Social History Narrative  . None    Review of Systems: A 12 point ROS discussed  Review of Systems  Constitutional: Positive for activity change, appetite change and fatigue. Negative for chills and fever.  HENT: Negative.     Respiratory:       Trach collar  Cardiovascular: Negative.   Gastrointestinal: Negative.   Musculoskeletal: Negative.   Skin: Negative.   Neurological:       Quadriplegia  Psychiatric/Behavioral: Negative.     Vital Signs: BP 106/71   Pulse 80   Temp 98.4 F (36.9 C) (Oral)   Resp 20   Ht 5\' 2"  (1.575 m)   Wt 86 lb 6.7 oz (39.2 kg)   SpO2 97%   BMI 15.81 kg/m   Physical Exam  Constitutional: She is oriented to person, place, and time.  Thin, NAD  Eyes: EOM are normal.  Neck: Normal range of motion.  Cardiovascular: Normal rate, regular rhythm and normal heart sounds.   Pulmonary/Chest: Effort normal and breath sounds normal. No respiratory distress.  Abdominal: Soft.  Scar at RUQ from previous Gtube  Musculoskeletal:  Quadriplegia  Neurological: She  is alert and oriented to person, place, and time.  Psychiatric: She has a normal mood and affect. Her behavior is normal. Judgment and thought content normal.  Vitals reviewed.   Mallampati Score:  MD Evaluation Airway:  (Trach collar) Heart: WNL Abdomen: WNL Chest/ Lungs: WNL ASA  Classification: 3 Mallampati/Airway Score:  (Trach collar)  Imaging: Ct Abdomen Pelvis Wo Contrast  Result Date: 11/13/2015 CLINICAL DATA:  Abdominal distention and tenderness. History of ileus and appendectomy, quadriparesis. Assess anatomy for gastrostomy tube placement. EXAM: CT ABDOMEN AND PELVIS WITHOUT CONTRAST TECHNIQUE: Multidetector CT imaging of the abdomen and pelvis was performed following the standard protocol without IV contrast. COMPARISON:  CT pelvis November 05, 2015 FINDINGS: LOWER CHEST: Dense consolidation in the included lower lobes bilaterally. Bilateral pleural effusions. Heart size is normal. No pericardial effusion. Cardiac pacer wires in place. HEPATOBILIARY: Normal. PANCREAS: Normal. SPLEEN: Normal. ADRENALS/URINARY TRACT: Kidneys are orthotopic, demonstrating normal size and morphology. 6 mm calculus in RIGHT  renal pelvis, 6 mm calculus in proximal RIGHT ureter. 3 mm and 2 mm LEFT lower pole nephrolithiasis. No hydronephrosis. Limited assessment for renal masses on this nonenhanced examination. The unopacified ureters are normal in course and caliber. Urinary bladder is partially distended containing a Foley catheter in bulb. Multiple small dependent bladder calculi. Normal adrenal glands. STOMACH/BOWEL: Mild gas and contrast distended colon to 5.7 cm. Decompressed small bowel. Gastrostomy tube terminates in mid stomach. Tenting of the greater car bridge the stomach toward the abdominal wall most compatible with old gastrostomy tube. VASCULAR/LYMPHATIC: Aortoiliac vessels are normal in course and caliber, severe calcific atherosclerosis. REPRODUCTIVE: Normal. OTHER: Moderate amount of low-density ascites. No intraperitoneal free air. MUSCULOSKELETAL: Anasarca. LEFT anterior abdominal wall scarring. Bilateral sacral in ischial tuberosity ulcers with packing material. Severe osteopenia. Sclerotic cough 6 with bony reabsorption. IMPRESSION: Gas and contrast distended large bowel compatible with ileus. No bowel obstruction. Tenting of the stomach to the anterior abdominal wall at site of prior gastrostomy. Worsening anasarca, increasing moderate ascites. Severe atherosclerosis. 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 Electronically Signed   By: Elon Alas M.D.   On: 11/13/2015 00:32   Dg Chest 1 View  Result Date: 11/23/2015 CLINICAL DATA:  Increased tracheal secretions. EXAM: CHEST 1 VIEW COMPARISON:  11/14/2015 FINDINGS: The tracheostomy tube tip is above the carina. The feeding tube tip is below the field of view within the upper abdomen. There is a left chest wall pacer device with leads in the right atrial appendage and right ventricle. Normal heart size. There has been interval decrease in volume of left effusion. Persistent right pleural effusion. IMPRESSION: 1. Decrease in  volume of left pleural effusion. 2. Persistent right effusion. Electronically Signed   By: Kerby Moors M.D.   On: 11/23/2015 09:53   Dg Abd 1 View  Result Date: 11/23/2015 CLINICAL DATA:  Ileus. EXAM: ABDOMEN - 1 VIEW COMPARISON:  11/22/2015.  11/21/2015. FINDINGS: Dobhoff tube noted with tip projected over stomach. Soft tissue structures are unremarkable. Oral contrast noted throughout the colon. Again previously identified small bowel distention has improved. No free air. Pelvic calcifications consistent phleboliths. Surgical clips in the pelvis. IMPRESSION: 1.Dobhoff tube in stable position with tip in the stomach. No gastric distention. 2. Oral contrast in the colon. Again previously identified small bowel distention has improved . Electronically Signed   By: Marcello Moores  Register   On: 11/23/2015 07:10   Dg Abd 1 View  Result Date: 11/22/2015 CLINICAL DATA:  45 y/o  F; ileus. EXAM:  ABDOMEN - 1 VIEW COMPARISON:  11/21/2015 abdomen radiograph FINDINGS: Enteric tube tip projects over mid stomach. Retained contrast within the colon. Persistent dilated loops of small bowel mildly improved. No acute osseous abnormality is evident. IMPRESSION: Persistently dilated loops of small bowel mildly improved and retained contrast within the colon. Electronically Signed   By: Kristine Garbe M.D.   On: 11/22/2015 05:57   Dg Abd 1 View  Result Date: 11/16/2015 CLINICAL DATA:  NG tube placement EXAM: ABDOMEN - 1 VIEW COMPARISON:  CT abdomen pelvis 0000000 FINDINGS: Metallic tip of the nasogastric tube overlies the pylorus. Contrast material instilled through the tube is seen at the gastroduodenal junction extending into the small bowel. IMPRESSION: Nasogastric tube tip overlying the pylorus. Electronically Signed   By: Ulyses Jarred M.D.   On: 11/16/2015 17:46   Dg Abd 1 View  Result Date: 11/12/2015 CLINICAL DATA:  Nasogastric tube placement. EXAM: ABDOMEN - 1 VIEW COMPARISON:  11/05/2015 radiographs  FINDINGS: The tip of a gastric tube included side-port are seen below the level the left hemidiaphragm in the expected location of the stomach. Moderate gaseous distention of small and large bowel in a nonspecific bowel gas pattern is identified. There is no pneumoperitoneum. Right atrial and right ventricular pacing wires are noted of the visualized heart which is mildly enlarged. Small bilateral effusions. IMPRESSION: Gastric tube tip and side port in the expected location of stomach. Nonspecific bowel gas pattern with mild to moderate gaseous distention of large and small bowel. Electronically Signed   By: Ashley Royalty M.D.   On: 11/12/2015 23:51   Ct Chest W Contrast  Result Date: 11/01/2015 CLINICAL DATA:  Abnormal chest radiograph.  Leukocytosis. History of traumatic spinal injury with tear per leisure a. History of re- recurrent UTI, decubitus ulcer, malnutrition and recurrent sepsis. EXAM: CT CHEST WITH CONTRAST TECHNIQUE: Multidetector CT imaging of the chest was performed during intravenous contrast administration. CONTRAST:  32mL ISOVUE-300 IOPAMIDOL (ISOVUE-300) INJECTION 61% COMPARISON:  Chest CT - 09/16/2013; 10/20/2009 FINDINGS: Cardiovascular: Normal heart size. Left anterior chest wall pacemaker with tips terminating within the right atrium ventricle. Coronary artery calcifications. No pericardial effusion. Although this examination was not tailored for the evaluation the pulmonary arteries, there are no discrete filling defects within the central pulmonary arterial tree to suggest central pulmonary embolism. Normal caliber of the main pulmonary artery. Mediastinum/Nodes: Mediastinal adenopathy with index left-sided paratracheal lymph node measuring 1.4 cm in greatest short axis diameter (image 46, series 2 and index precarinal lymph node measuring 1 cm (image 50, series 2). No axillary lymphadenopathy. Lungs/Pleura: There is near complete occlusion of the right bronchus intermedius secondary to  presumed aspirated debris (axial image 56, series 7; coronal image 51, series 602) with near complete atelectasis / collapse of the right middle and lower lobes. The right upper lobe remains fairly well aerated. The left bronchi appear patent, however there are rather extensive consolidative airspace opacities throughout the left lung, worse within the left lower lobe with associated air bronchograms. Small, potentially loculated right-sided pleural effusion and trace left-sided pleural effusion. No pneumothorax. Upper Abdomen: Note is made of a small amounts of perihepatic ascites (image 33, series 2 Musculoskeletal: No acute or aggressive osseous abnormalities. IMPRESSION: 1. Occlusion of the right bronchus intermedius with associated consolidation and collapse of the right middle and lower lobes, worrisome for aspiration/aspiration pneumonia. Further evaluation with bronchoscopy could be performed as clinically indicated. 2. Extensive airspace opacities within the left lung, worse within the left lower lobe, also  worrisome for areas of aspiration and/or multi focal infection. 3. Small right and trace left-sided pleural effusions - note, the right-sided pleural effusion may be at least partially loculated. 4. Mediastinal adenopathy, nonspecific though presumably reactive in the setting of multi focal infection. 5. Small amount of perihepatic ascites, incompletely evaluated. Electronically Signed   By: Sandi Mariscal M.D.   On: 11/01/2015 16:43   Ct Pelvis Wo Contrast  Result Date: 11/05/2015 CLINICAL DATA:  Quadriplegia, decubitus ulcers, assessment for possible perineal fasciitis. EXAM: CT PELVIS WITHOUT CONTRAST TECHNIQUE: Multidetector CT imaging of the pelvis was performed following the standard protocol without intravenous contrast. COMPARISON:  Radiographs from 11/05/2015; pelvic CT from 12/31/2011 FINDINGS: Urinary Tract: Foley catheter in the urinary bladder. Tiny calcifications in the right-sided the  urinary bladder compatible with small bladder calculi. Bowel: Mild prominence of stool in the colon. No dilated small bowel in the pelvis. Vascular/Lymphatic: Dense atherosclerotic calcification. Reproductive: Calcification along the anterior uterine margin probably from a fibroid. Other: Extensive subcutaneous edema along the lower abdomen and pelvis. Moderate pelvic ascites. Musculoskeletal: Stage IV decubitus ulcer along the posterior sacrum extending to the bony margin. Stage IV decubitus ulcers overlying both ischial tuberosities, extending to the bony margin. Sclerosis of the left ischial tuberosity extending into the posterior wall of the left acetabulum compatible with chronic osteomyelitis. I would be surprised if there is not also chronic osteomyelitis of the right ischium and of the sacrum. Bony spurring of the posterior wall of the right acetabulum. Patchy osteoporosis in the lower pelvis and hips, small left hip joint effusion. Abnormal edema tracks into the mons pubis, labia, and upper thigh regions, an asymmetric fashion, an without gas infiltration to suggest emphysematous fasciitis. IMPRESSION: 1. There is no evidence of emphysematous fasciitis along the perineum. The appearance at earlier radiography was likely either due to the considerable subcutaneous stranding outlining lobules of adipose tissue and simulating gas, or possibly fluid within a diaper or garment along the perineum. 2. There are stage IV decubitus ulcers along the sacrum and ischial tuberosities, associated with chronic osteomyelitis, most apparent in the left ischium. 3. Diffuse subcutaneous edema and third spacing of fluid including mesenteric edema and moderate pelvic ascites. 4. Small bladder calculi are present. 5.  Aortoiliac atherosclerotic vascular disease. Electronically Signed   By: Van Clines M.D.   On: 11/05/2015 14:36   Dg Chest Port 1 View  Result Date: 11/14/2015 CLINICAL DATA:  45 year old female with  pneumonia. EXAM: PORTABLE CHEST 1 VIEW COMPARISON:  Chest radiograph dated 11/13/2015 FINDINGS: Tracheostomy with tip above the carina in stable positioning. Enteric tube courses into the left hemi abdomen with tip side-port below the diaphragm and tip beyond the inferior margin of the image. No significant interval change in the small bilateral pleural effusions with bibasilar hazy airspace densities. There is no pneumothorax. Stable cardiac silhouette. Left pectoral pacemaker device. Partially visualized cervical fusion hardware. No acute fracture. IMPRESSION: No significant interval change in the small bilateral pleural effusions and lower lobe airspace densities. Support line and tube in stable positioning. Electronically Signed   By: Anner Crete M.D.   On: 11/14/2015 06:34   Dg Chest Port 1 View  Result Date: 11/13/2015 CLINICAL DATA:  Respiratory failure. EXAM: PORTABLE CHEST 1 VIEW COMPARISON:  11/12/2015. FINDINGS: Tracheostomy tube and left IJ line in stable position. Cardiac pacer stable position. Heart size stable. Low lung volumes. Persistent bilateral lower lobe airspace disease . Persistent bilateral pleural effusions. Prior cervical spine fusion. IMPRESSION: 1. Interim placement  of NG tube, its tip is below left hemidiaphragm. Tracheostomy tube and left IJ line stable position. 2. Persistent low lung volumes. Persistent unchanged bilateral lower lobe airspace disease and small pleural effusions. Electronically Signed   By: Marcello Moores  Register   On: 11/13/2015 07:31   Dg Chest Port 1 View  Result Date: 11/12/2015 CLINICAL DATA:  Pneumonia EXAM: PORTABLE CHEST 1 VIEW COMPARISON:  11/09/2015 FINDINGS: Left pacer remains in place, unchanged. Interval placement tracheostomy tube with the tip projecting over the mid trachea. Left central line tip in the SVC. Bilateral lower lobe airspace opacities with layering effusions. Aeration somewhat worsened since prior study. IMPRESSION: Worsening  bilateral lower lobe airspace opacities and small layering effusions. Electronically Signed   By: Rolm Baptise M.D.   On: 11/12/2015 12:53   Dg Chest Port 1 View  Result Date: 11/09/2015 CLINICAL DATA:  Endotracheal tube placement. EXAM: PORTABLE CHEST 1 VIEW COMPARISON:  11/09/2015 FINDINGS: Endotracheal tube is 3.6 cm above the carina and appropriately positioned. Nasogastric tube extends into the abdomen. Stable position of the left dual-chamber cardiac pacemaker. Again noted are patchy airspace densities in the lower lungs. Evidence for right pleural fluid. Negative for a pneumothorax. There is a left jugular central venous catheter and the tip is near the junction of the left innominate vein and SVC. IMPRESSION: Endotracheal tube is appropriately positioned. Support apparatuses as described. Persistent airspace densities in the lower lungs. Findings could represent edema versus pneumonia. Minimal change from the previous examination. Right pleural effusion. Electronically Signed   By: Markus Daft M.D.   On: 11/09/2015 10:57   Dg Chest Port 1 View  Result Date: 11/09/2015 CLINICAL DATA:  Respiratory failure. EXAM: PORTABLE CHEST 1 VIEW COMPARISON:  11/08/2015. FINDINGS: Endotracheal tube and NG tube in stable position. Cardiac pacer noted with leads in the right atrium right ventricle. Heart size stable. Persistent bilateral airspace disease, left side greater than right. Persistent scratched right pleural effusion. Prior cervical spine fusion. IMPRESSION: 1. Lines and tubes in stable position. 2.  Cardiac pacer stable position.  Heart size stable. 3. Persistent bilateral airspace disease in right pleural effusion. No significant change. Electronically Signed   By: Marcello Moores  Register   On: 11/09/2015 07:59   Dg Chest Port 1 View  Result Date: 11/08/2015 CLINICAL DATA:  Respiratory failure. EXAM: PORTABLE CHEST 1 VIEW COMPARISON:  11/07/2015. FINDINGS: Endotracheal tube and NG tube in stable position.  Cardiac pacer stable position. Heart size stable. Mild bilateral scratch chest diffuse bilateral airspace disease, no change. Small right pleural effusion. Small bilateral pleural effusions. No change. No pneumothorax. IMPRESSION: 1. Lines and tubes in stable position. 2. Stable diffuse bilateral airspace disease small bilateral pleural effusions. No change from prior exam P Electronically Signed   By: Marcello Moores  Register   On: 11/08/2015 06:50   Dg Chest Port 1 View  Result Date: 11/07/2015 CLINICAL DATA:  45 year old female status post intubation. EXAM: PORTABLE CHEST 1 VIEW COMPARISON:  Chest radiograph dated 11/05/2015 FINDINGS: Endotracheal tube the tip approximately 4.3 cm above the carina. Enteric tube courses into the left upper abdomen with tip beyond the inferior margin of the image. Bilateral mid to lower lung field airspace opacities, left greater right again noted. There small bilateral pleural effusions. No pneumothorax. Stable cardiac silhouette with left pectoral pacemaker device. Cervical spine fusion screws. No acute fracture. IMPRESSION: Endotracheal tube above the carina. Bilateral interstitial and airspace densities, left greater right with no significant interval change since the prior radiograph. Small bilateral pleural  effusions, right greater left. Electronically Signed   By: Anner Crete M.D.   On: 11/07/2015 01:19   Dg Chest Port 1 View  Result Date: 11/05/2015 CLINICAL DATA:  Hypoxia EXAM: PORTABLE CHEST 1 VIEW COMPARISON:  November 04, 2015. FINDINGS: Endotracheal tube tip is 3.5 cm above the carina. Nasogastric tube tip and side port are below the diaphragm. Pacemaker leads are attached to the right atrium and right ventricle. No evident pneumothorax. There is a right pleural effusion which appears partially loculated, stable. There is interstitial and patchy alveolar edema in the mid and lower lung zones bilaterally. Heart is borderline prominent with pulmonary vascularity  within normal limits. There is postoperative change in the cervical spine. IMPRESSION: Tube and pacemaker lead positions as described without pneumothorax. Right pleural effusion with areas of interstitial and patchy alveolar edema. Suspect a degree of congestive heart failure. A degree of superimposed pneumonia cannot be excluded radiographically. The appearance of the lungs and cardiac silhouette is stable compared to 1 day prior. Electronically Signed   By: Lowella Grip III M.D.   On: 11/05/2015 07:18   Dg Chest Port 1 View  Result Date: 11/04/2015 CLINICAL DATA:  Acute respiratory failure. EXAM: PORTABLE CHEST 1 VIEW COMPARISON:  November 03, 2015 FINDINGS: Stable support apparatus. Pacemaker. No pneumothorax. Stable right pleural effusion. Bilateral pulmonary opacities have mildly worsened in the interval. No other interval changes or acute abnormalities. Mildly distended stomach the despite the NG tube. IMPRESSION: 1. Mildly worsened bilateral pulmonary opacities, possibly edema. Stable right effusion. 2. Stable support apparatus. 3. Mildly distended stomach despite the NG tube. Electronically Signed   By: Dorise Bullion III M.D   On: 11/04/2015 07:10   Dg Chest Port 1 View  Result Date: 11/03/2015 CLINICAL DATA:  Healthcare associated pneumonia. EXAM: PORTABLE CHEST 1 VIEW COMPARISON:  Multiple recent previous exams. FINDINGS: 0504 hours. Endotracheal tube tip is 3.4 cm above the base of the carina. The NG tube passes into the stomach although the distal tip position is not included on the film. Left IJ central line tip overlies the upper SVC. Left permanent pacemaker again noted. Patient is rotated to the right. Bibasilar atelectasis/infiltrate again noted, slightly progressed on the left. Small right pleural effusion stable. Prominent gastric bubble noted despite the presence of an NG tube. IMPRESSION: 1. Stable cardiopulmonary exam. 2. Prominent gastric bubble despite the presence of an NG  tube. Electronically Signed   By: Misty Stanley M.D.   On: 11/03/2015 08:01   Dg Chest Port 1 View  Result Date: 11/02/2015 CLINICAL DATA:  Endotracheal tube repositioning.  Initial encounter. EXAM: PORTABLE CHEST 1 VIEW COMPARISON:  Chest radiograph performed earlier today at 12:27 a.m. FINDINGS: The patient's endotracheal tube is seen ending 4 cm above the carina. An enteric tube is noted extending below the diaphragm. A small right pleural effusion is noted. Vascular congestion is noted, with increased interstitial markings, concerning for mild pulmonary edema. No pneumothorax is seen. The cardiomediastinal silhouette is normal in size. A pacemaker is noted overlying the left chest wall, with leads ending overlying the right atrium and right ventricle. No acute osseous abnormalities are identified. Cervical spinal fusion hardware is partially imaged. IMPRESSION: 1. Endotracheal tube noted ending 4 cm above the carina. 2. Small right pleural effusion. Vascular congestion, with increased interstitial markings, concerning for mild pulmonary edema. Electronically Signed   By: Garald Balding M.D.   On: 11/02/2015 01:08   Dg Chest Port 1 View  Result Date: 11/02/2015 CLINICAL  DATA:  Post intubation. EXAM: PORTABLE CHEST 1 VIEW COMPARISON:  CT yesterday at 1625 hour FINDINGS: Endotracheal tube is 5.6 cm from the carina. Enteric tube in place, tip and side-port below the diaphragm in the stomach. Worsening volume loss in the right lung with increasing basilar opacity and pleural fluid. New patchy opacities throughout the left lung. Unchanged heart size and mediastinal contours. No pneumothorax. Surgical hardware in the cervical spine is partially included. IMPRESSION: 1. Endotracheal tube 5.6 cm from the carina. Tip and side port of the enteric tube below the diaphragm. 2. Worsening lung aeration with increasing right pleural effusion and right lung volume loss. Worsening of patchy opacities throughout the left  lung, suspect worsening pneumonia or atelectasis, less likely pulmonary edema. Electronically Signed   By: Jeb Levering M.D.   On: 11/02/2015 01:04   Dg Chest Port 1 View  Result Date: 10/31/2015 CLINICAL DATA:  Respiratory distress EXAM: PORTABLE CHEST 1 VIEW COMPARISON:  09/24/2015 FINDINGS: Cardiac shadow is stable. The lungs are well aerated bilaterally and again demonstrate some bibasilar infiltrative changes. They have improved somewhat in the interval from the prior exam. Small right pleural effusion remains. No new focal abnormality is seen. IMPRESSION: Persistent changes in the bases bilaterally right slightly greater than left although some improvement is noted. A right effusion remains. Electronically Signed   By: Inez Catalina M.D.   On: 10/31/2015 15:21   Dg Abd Portable 1v  Result Date: 11/21/2015 CLINICAL DATA:  All follow-up ileus. EXAM: PORTABLE ABDOMEN - 1 VIEW COMPARISON:  11/16/2015 FINDINGS: Feeding tube with the tip projecting over the stomach. There is gaseous distention of small bowel and colon. There is oral contrast material which has traversed the small bowel and is canal present within the colon. There is no evidence of pneumoperitoneum, portal venous gas or pneumatosis. There are no pathologic calcifications along the expected course of the ureters. The osseous structures are unremarkable. IMPRESSION: 1. Feeding tube with the tip projecting over the stomach. 2. Gaseous distention of small bowel colon without bowel obstruction. Contrast seen within the small bowel on the prior exam of 11/16/2015 is not present within the colon. Electronically Signed   By: Kathreen Devoid   On: 11/21/2015 10:25   Dg Abd Portable 1v  Addendum Date: 11/05/2015   ADDENDUM REPORT: 11/05/2015 12:49 ADDENDUM: The original report was by Dr. Van Clines. The following addendum is by Dr. Van Clines: These results were called by telephone at the time of interpretation on 11/05/2015 at  12:37 pm to Dr. Noe Gens , who verbally acknowledged these results. Electronically Signed   By: Van Clines M.D.   On: 11/05/2015 12:49   Result Date: 11/05/2015 CLINICAL DATA:  Constipation. Distended abdomen. Hx asthma, GERD, quadriparesis, appendectomy EXAM: PORTABLE ABDOMEN - 1 VIEW COMPARISON:  05/07/2015. FINDINGS: Nasogastric tube noted, tip in the stomach antrum. Prominent stool throughout the colon favors constipation. No dilated small bowel identified. New blunting of the right lateral costophrenic angle. Interstitial accentuation at the lung bases the. Pacer leads project over the cardiac shadow. Bony demineralization in the lower pelvis and hips. Streaky linear lucencies -possibly gas density- along the pelvic soft tissues of the perineum. IMPRESSION: 1. Questionable gas densities along the perineum, I cannot exclude fasciitis or decubitus ulcers involving the perineum and underlying the ischial tuberosities. Conceivably similar density could be caused by partially wet diaper or clothing. Fasciitis involving the pelvic floor can be fulminant than life-threatening, CT of the pelvis should be considered for  further characterization. 2. Patchy lucencies in both hips and in the lower pelvis, some of this may represent gas density projecting over the hips, and some may be reactive. 3.  Prominent stool throughout the colon favors constipation. 4. Right pleural effusion with bibasilar indistinct densities. Radiology assistant personnel have been notified to put me in telephone contact with the referring physician or the referring physician's clinical representative in order to discuss these findings. Once this communication is established I will issue an addendum to this report for documentation purposes. Electronically Signed: By: Van Clines M.D. On: 11/05/2015 12:35   Dg Swallowing Func-speech Pathology  Result Date: 11/16/2015 Objective Swallowing Evaluation: Type of Study:  MBS-Modified Barium Swallow Study Patient Details Name: Angela Page MRN: [id number] Date of Birth: 1970-08-21 Today's Date: 11/16/2015 Time: SLP Start Time (ACUTE ONLY): 1505-SLP Stop Time (ACUTE ONLY): 1537 SLP Time Calculation (min) (ACUTE ONLY): 32 min Past Medical History: Past Medical History: Diagnosis Date . Asthma  . GERD (gastroesophageal reflux disease)  . Pacemaker  . Pleurisy  . Quadriparesis [facility])  Past Surgical History: Past Surgical History: Procedure Laterality Date . APPENDECTOMY   . CARDIAC SURGERY   . I&D EXTREMITY  10/28/2011  Procedure: IRRIGATION AND DEBRIDEMENT EXTREMITY;  Surgeon: Tennis Must, MD;  Location: [place];  Service: Orthopedics;  Laterality: Right;  Irrigation and debridement right middle finger HPI: 45 yo female adm to [facility] with leukocytosis and AMS 10/31/15.  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  Pt with h/o C2-C7 fusion and C3-C6 osteophytic changes per imaging study.  Pt resides with her caregiver mom per RN.  Hospital coarse complicated by pt requiring intubation for respiratory compromise, found to have occlusion of right bronchus with collapse of RM and RL lungs concerning for aspiration, left lung findings also concerning for aspiration.  Pt was placed on vent 10/12-10/17 - reintubated evening of 10/17 and trach placed 10/23.  Pt has been off the vent today full day and tolerating well.  Recent hospitalization for pna noted; pt coded and required defibrillation.  She also has h/o vent use for 3 months and trach *removed previously.  PMSV and swallow eval ordered.  Subjective: pt alert in chair, feeding tube removed for testing Assessment / Plan / Recommendation CHL IP CLINICAL IMPRESSIONS 11/16/2015 Therapy Diagnosis Suspected primary esophageal dysphagia;Mild pharyngeal phase dysphagia Clinical Impression Pt with only mild pharyngeal dysphagia without aspiration or deep penetration of any consistency tested.  Trace penetration  of thin cleared within the same swallow.  She did NOT tolerate PMSV on for MBS due to quick complaint of difficulty breathing and overt breath stacking within 30 seconds.  Oral swallow was functional with only trace oral residuals with solids.  Pharyngeal swallow minimally weak with resultant vallecular residuals with solids which pt largely senses and clears with dry swallows.  Minimal liquid residuals noted at valleculae and pyriform sinus without consistent awareness.     Pt's largest aspiration risk unfortunately appears to be esophageal.  Appearance of poor clearance of esophagus noted and SLP requested Dr Rosana Hoes *radiologist* view pt's esophagus due to positioning difficulties for full esophagram with pt's quadriparesis.  Dr Rosana Hoes reported pt with "dysmotility and significant reflux" to proximal esophagus.  Dedicated esophageal evaluation not conducted as per Dr Rosana Hoes, further information would not be obtained through esophagram.  Of note, pt also became fatigued from testing, causing concern for ability to meet nutrition regardless of esophageal concerns.  Skilled intervention included educating pt  to findings using live video and reinforcing understanding.  SLP to follow.  Impact on safety and function Severe aspiration risk;Risk for inadequate nutrition/hydration   CHL IP TREATMENT RECOMMENDATION 11/16/2015 Treatment Recommendations Therapy as outlined in treatment plan below   Prognosis 11/16/2015 Prognosis for Safe Diet Advancement Guarded Barriers to Reach Goals Severity of deficits Barriers/Prognosis Comment -- CHL IP DIET RECOMMENDATION 11/16/2015 SLP Diet Recommendations Ice chips PRN after oral care Liquid Administration via -- Medication Administration Via alternative means Compensations -- Postural Changes --   CHL IP OTHER RECOMMENDATIONS 11/16/2015 Recommended Consults -- Oral Care Recommendations Oral care QID Other Recommendations --   CHL IP FOLLOW UP RECOMMENDATIONS 11/15/2015 Follow up  Recommendations (No Data)   CHL IP FREQUENCY AND DURATION 11/16/2015 Speech Therapy Frequency (ACUTE ONLY) min 2x/week Treatment Duration 2 weeks      CHL IP ORAL PHASE 11/16/2015 Oral Phase Impaired Oral - Pudding Teaspoon -- Oral - Pudding Cup -- Oral - Honey Teaspoon -- Oral - Honey Cup -- Oral - Nectar Teaspoon -- Oral - Nectar Cup -- Oral - Nectar Straw WFL Oral - Thin Teaspoon WFL Oral - Thin Cup WFL Oral - Thin Straw WFL Oral - Puree WFL Oral - Mech Soft -- Oral - Regular WFL Oral - Multi-Consistency -- Oral - Pill -- Oral Phase - Comment --  CHL IP PHARYNGEAL PHASE 11/16/2015 Pharyngeal Phase Impaired Pharyngeal- Pudding Teaspoon -- Pharyngeal -- Pharyngeal- Pudding Cup -- Pharyngeal -- Pharyngeal- Honey Teaspoon -- Pharyngeal -- Pharyngeal- Honey Cup -- Pharyngeal -- Pharyngeal- Nectar Teaspoon -- Pharyngeal -- Pharyngeal- Nectar Cup -- Pharyngeal -- Pharyngeal- Nectar Straw Pharyngeal residue - valleculae;Pharyngeal residue - pyriform Pharyngeal -- Pharyngeal- Thin Teaspoon Pharyngeal residue - valleculae;Pharyngeal residue - pyriform Pharyngeal -- Pharyngeal- Thin Cup Pharyngeal residue - valleculae;Pharyngeal residue - pyriform Pharyngeal -- Pharyngeal- Thin Straw Penetration/Aspiration during swallow Pharyngeal Material enters airway, remains ABOVE vocal cords then ejected out Pharyngeal- Puree Pharyngeal residue - valleculae Pharyngeal -- Pharyngeal- Mechanical Soft -- Pharyngeal -- Pharyngeal- Regular Pharyngeal residue - valleculae Pharyngeal -- Pharyngeal- Multi-consistency -- Pharyngeal -- Pharyngeal- Pill -- Pharyngeal -- Pharyngeal Comment pt largely sensed mild pharyngeal residuals and dry swallows faciliate clearance  CHL IP CERVICAL ESOPHAGEAL PHASE 11/16/2015 Cervical Esophageal Phase Impaired Pudding Teaspoon -- Pudding Cup -- Honey Teaspoon -- Honey Cup -- Nectar Teaspoon -- Nectar Cup -- Nectar Straw -- Thin Teaspoon -- Thin Cup -- Thin Straw -- Puree -- Mechanical Soft -- Regular --  Multi-consistency -- Pill -- Cervical Esophageal Comment appearance of poor clearance of esophagus, SLP requested Dr Rosana Hoes *radiologist* view pt's esophagus due to positioning difficulties for esophagram with her quadriparesis; Dr Rosana Hoes reported pt with dysmotility and significant reflux to proximal esophagus; dedicated esophageal evaluation not conducted as per Dr Rosana Hoes, further information would not be obtained through esophagram No flowsheet data found. Janett Labella [place], [place] [facility] [place] [phone]               Labs:  CBC:  Recent Labs  11/13/15 1003 11/17/15 0826 11/21/15 0323 11/22/15 0309  WBC 9.8 12.8* 8.0 8.3  HGB 7.3* 9.4* 7.9* 8.7*  HCT 22.2* 29.0* 25.2* 27.6*  PLT 476* 485* 366 381    COAGS:  Recent Labs  01/31/15 2255 09/22/15 2018 10/31/15 2234 11/02/15 0447 11/12/15 0510 11/21/15 0323  INR 1.14 0.95 1.17 1.58 1.04 0.87  APTT 34 21* 32 41*  --   --     BMP:  Recent Labs  11/17/15 0826 11/18/15 2144 11/22/15 0309  11/23/15 0323  NA 138 140 139 139  K 3.8 3.6 4.9 4.5  CL 103 103 98* 97*  CO2 28 31 31 31   GLUCOSE 117* 116* 124* 100*  BUN 9 12 20  27*  CALCIUM 8.5* 8.9 10.0 9.9  CREATININE <0.30* <0.30* <0.30* 0.37*  GFRNONAA NOT CALCULATED NOT CALCULATED NOT CALCULATED >60  GFRAA NOT CALCULATED NOT CALCULATED NOT CALCULATED >60    LIVER FUNCTION TESTS:  Recent Labs  09/26/15 0327 10/20/15 1450 10/31/15 1544 11/01/15 0205  BILITOT 0.6 0.6 0.6 0.3  AST 18 14* 13* 14*  ALT 11* 9* 8* 7*  ALKPHOS 98 125 178* 147*  PROT 4.9* 6.7 5.4* 4.7*  ALBUMIN 1.7* 2.5* 1.9* 1.6*    TUMOR MARKERS: No results for input(s): AFPTM, CEA, CA199, CHROMGRNA in the last 8760 hours.  Assessment and Plan:  Dysphagia  Will proceed with placement of gastrostomy tube today.  Patient understands this could be challenging given her recent ileus and history of previous gastrostomy tube.  Risks and Benefits discussed with the patient including, but not  limited to the need for a barium enema during the procedure, bleeding, infection, peritonitis, or damage to adjacent structures. All of the patient's questions were answered, patient is agreeable to proceed. Consent signed and in chart.  Thank you for this interesting consult.  I greatly enjoyed meeting Dezaria Truss and look forward to participating in their care.  A copy of this report was sent to the requesting provider on this date.  Electronically Signed: Murrell Redden PA-C 11/23/2015, 3:21 PM   I spent a total of 20 Minutes  in face to face in clinical consultation, greater than 50% of which was counseling/coordinating care for gastrostomy tube

## 2015-11-23 NOTE — Sedation Documentation (Signed)
Vital signs stable. 

## 2015-11-23 NOTE — Progress Notes (Signed)
PROGRESS NOTE    Angela Page  IYM:[id number] DOB: [birth year] DOA: 10/31/2015 PCP: PROVIDER NOT IN SYSTEM   Brief Narrative: 45 y.o. female with medical history significant of traumatic spinal injury with paraplegia s/p traumatic fall with C2-7 posterior fusion 10/08/13 , recurrent UTI, decubitus ulcer, malnutrition, recurrent sepsis, Sick sinus syndrome status post Medtronic dual Chamber Pacemaker placement 11/03/13, presented with worsening confusion, decreased by mouth intake, foul-smelling decubitus ulcer.  She developed progressive hypotension and respiratory failure from HCAP and a UTI leading to septic shock. Completed antibiotics course. Patient is extubated. Evaluation for nutrition ongoing. CCM transferred to [facility] on 11/17/2015, on 17th days of inpatient hospitalization.  Today, patient agreed for gastric tube placement. Meantime, she likes to continue jello or clear diet as tolerated. I spoke with Dr. Earleen Newport from IR for G tube placement.  Assessment & Plan:   #Acute hypoxic/hypercapnic respiratory failure secondary to aspiration pna/MDRO ACINETOBACTER UTI/septic shock/metabolic encephalopathy Patient was admitted on 10/11 with altered mental status , sepsis from uit/pna, the next day she was put on pressor and intubated due to worsening of mental status and developed septic shock required pressure.  She failed extubation and had to be reintubated on 10/17night, s/p trach on 10/23,   Improved. Patient completed antibiotics course as per medical record  Pulm recommended trach OK to change to #6 cuffless prior to discharge -continue supportive care. Suctions and trach care Increase secretion noticed on 11/3, repeat cxr, chest vest  #ileus: patient denies abdominal pain, no n/v, tolerating tube feeds. Report + flatus, no bm   keep k>4, mag>2,  kub daily showed  ileus has improved, will proceed with peg placement per radiology  # Protein-calorie malnutrition, severe ([place]): Speech  and swallow evaluation note reviewed. The largest aspiration risk appeared to be esophageal.  Discussed with the patient and she agreed with the PEG tube placement .  Meantime she likes to continue Jell-O or clear liquid as tolerated. However, RN report jello being suctioned out , I have discussed with patient and recommended her to be npo for now Speech and swallow team, dietary consult appreciated. Hopefully, after placement of gastric tube we will be able to discharge patient home with home care. Patient is declining SNF at this time.  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 bp stable on midodrine   Decubitus ulcer of sacral region, stage 4 (HCC)/chronic coccyx osteomyelitis. Wound care  Other hospital problems list:     Quadriplegia, C5-C7 incomplete ([place]): bedridden, indwelling catheter (being changed qmonth, last changed on 10/30) Continue baclofen and supportive care    Severe sepsis ([place])     Lower urinary tract infectious disease   Acute encephalopathy   History of asthma and tobacco abuse   Ileus: Improved   Hyperglycemia due to steroid   DVT prophylaxis: Lovenox subcutaneous Code Status: Full code Family Communication: No family present at bedside Disposition Plan: Currently at stepdown. Likely discharge home versus nursing home in 1-2 days after gastric tube placement and with tube feed.  Consultants:   Pulmonary critical care.  Interventional radiologist  Palliative care  Procedures: Lt femoral aline 10/13 >> 10/14 ETT 10/13 >> 10/17, 10/18 >>  Lt IJ CVL 10/13 >> 10/26  SIGNIFICANT EVENTS: 10/11  Admit 10/13  VDRF, pressors, bronch 10/15  Vasopressors off  10/16  Fever, weaning on PSV 10/5 10/17  Extubated, failed and reintubated overnight 10/20  Weaning on PSV, plan for trach 10/23 10/23 Tracheostomy  Antimicrobials: Vancomycin 10/11 >> 10/15 Levaquin 10/11 >>  10/12 Aztreonam 10/11 >> 10/12  Diflucan 10/12 >> 10/14 Primaxin 10/12 >>  10/22  STUDIES:  CT chest 10/12 >> occlusion of Rt bronchus intermedius, basilar consolidation, small Rt effusion Bronchoscopy 10/13 >> significant purulent secretions airways CT Pelvis 10/16 >> no evidence of emphysematous fasciitis along the perineum, stage IV decubitus ulcers along the sacrum / ischial tuberosities associated with chronic osteomyelitis CT abdomen 10/24 > ileus, anasarca  CULTURES: Blood 10/11 >> neg Urine 10/11 >> Acinetobacter, yeast Sputum 10/13 >> neg  Subjective:   patient was seen and examined at bedside..  On 5liter oxygen through trach, increased secretion noticed, jello being suctioned out,  denies pain, tolerating tube feeds, + flatus, no bm for the last three days  Denied fever, nausea, vomiting, or abdominal pain,    Objective: Vitals:   11/23/15 0400 11/23/15 0600 11/23/15 0624 11/23/15 0753  BP: 98/65 (!) 76/52 (!) 84/58   Pulse: 86     Resp: _0 Temp: 97.5 F (36.4 C)     TempSrc: Axillary     SpO2: 97% 100% 100% 100%  Weight:      Height:        Intake/Output Summary (Last 24 hours) at 11/23/15 0820 Last data filed at 11/23/15 0700  Gross per 24 hour  Intake             1975 ml  Output             1850 ml  Net              125 ml   Filed Weights   11/21/15 0500 11/22/15 0045 11/22/15 0344  Weight: 42.8 kg (94 lb 5.7 oz) 39.2 kg (86 lb 6.7 oz) 39.2 kg (86 lb 6.7 oz)    Examination:  General exam:Cachectic female with NG tube for feeding, not in distress Neck: Trachea stoma site clean with no bleeding. Respiratory system: diminished at basis, no wheeze Cardiovascular system: Regular rate rhythm, S1 and S2 normal. No pedal edema, s/p pacemaker Gastrointestinal system: Bowel sound positive, abdomen soft, nontender and not distended Central nervous system: Alert, awake, following commands and oriented. Extremities: paraplegia Skin: chronic sacral decubitus ulcer Psychiatry: Judgement and insight appear normal. Mood &  affect appropriate.     Data Reviewed: I have personally reviewed following labs and imaging studies  CBC:  Recent Labs Lab 11/17/15 0826 11/21/15 0323 11/22/15 0309  WBC 12.8* 8.0 8.3  NEUTROABS  --  5.7  --   HGB 9.4* 7.9* 8.7*  HCT 29.0* 25.2* 27.6*  MCV 95.4 97.7 98.2  PLT 485* 366 492   Basic Metabolic Panel:  Recent Labs Lab 11/17/15 0826 11/18/15 2144 11/22/15 0309 11/23/15 0323  NA 138 140 139 139  K 3.8 3.6 4.9 4.5  CL 103 103 98* 97*  CO2 _1 GLUCOSE 117* 116* 124* 100*  BUN _2 27*  CREATININE <0.30* <0.30* <0.30* 0.37*  CALCIUM 8.5* 8.9 10.0 9.9  MG 2.2  --  2.4  --    GFR: Estimated Creatinine Clearance: 55 mL/min (by C-G formula based on SCr of 0.37 mg/dL (L)). Liver Function Tests: No results for input(s): AST, ALT, ALKPHOS, BILITOT, PROT, ALBUMIN in the last 168 hours. No results for input(s): LIPASE, AMYLASE in the last 168 hours. No results for input(s): AMMONIA in the last 168 hours. Coagulation Profile:  Recent Labs Lab 11/21/15 0323  INR 0.87   Cardiac Enzymes: No results for input(s): CKTOTAL,  CKMB, CKMBINDEX, TROPONINI in the last 168 hours. BNP (last 3 results) No results for input(s): PROBNP in the last 8760 hours. HbA1C: No results for input(s): HGBA1C in the last 72 hours. CBG:  Recent Labs Lab 11/22/15 1554 11/22/15 1942 11/23/15 0013 11/23/15 0511 11/23/15 0800  GLUCAP 98 132* 99 122* 108*   Lipid Profile: No results for input(s): CHOL, HDL, LDLCALC, TRIG, CHOLHDL, LDLDIRECT in the last 72 hours. Thyroid Function Tests: No results for input(s): TSH, T4TOTAL, FREET4, T3FREE, THYROIDAB in the last 72 hours. Anemia Panel: No results for input(s): VITAMINB12, FOLATE, FERRITIN, TIBC, IRON, RETICCTPCT in the last 72 hours. Sepsis Labs: No results for input(s): PROCALCITON, LATICACIDVEN in the last 168 hours.  No results found for this or any previous visit (from the past 240 hour(s)).        Radiology Studies: Dg Abd 1 View  Result Date: 11/23/2015 CLINICAL DATA:  Ileus. EXAM: ABDOMEN - 1 VIEW COMPARISON:  11/22/2015.  11/21/2015. FINDINGS: Dobhoff tube noted with tip projected over stomach. Soft tissue structures are unremarkable. Oral contrast noted throughout the colon. Again previously identified small bowel distention has improved. No free air. Pelvic calcifications consistent phleboliths. Surgical clips in the pelvis. IMPRESSION: 1.Dobhoff tube in stable position with tip in the stomach. No gastric distention. 2. Oral contrast in the colon. Again previously identified small bowel distention has improved . Electronically Signed   By: Marcello Moores  Register   On: 11/23/2015 07:10   Dg Abd 1 View  Result Date: 11/22/2015 CLINICAL DATA:  45 y/o  F; ileus. EXAM: ABDOMEN - 1 VIEW COMPARISON:  11/21/2015 abdomen radiograph FINDINGS: Enteric tube tip projects over mid stomach. Retained contrast within the colon. Persistent dilated loops of small bowel mildly improved. No acute osseous abnormality is evident. IMPRESSION: Persistently dilated loops of small bowel mildly improved and retained contrast within the colon. Electronically Signed   By: Kristine Garbe M.D.   On: 11/22/2015 05:57   Dg Abd Portable 1v  Result Date: 11/21/2015 CLINICAL DATA:  All follow-up ileus. EXAM: PORTABLE ABDOMEN - 1 VIEW COMPARISON:  11/16/2015 FINDINGS: Feeding tube with the tip projecting over the stomach. There is gaseous distention of small bowel and colon. There is oral contrast material which has traversed the small bowel and is canal present within the colon. There is no evidence of pneumoperitoneum, portal venous gas or pneumatosis. There are no pathologic calcifications along the expected course of the ureters. The osseous structures are unremarkable. IMPRESSION: 1. Feeding tube with the tip projecting over the stomach. 2. Gaseous distention of small bowel colon without bowel obstruction.  Contrast seen within the small bowel on the prior exam of 11/16/2015 is not present within the colon. Electronically Signed   By: Kathreen Devoid   On: 11/21/2015 10:25        Scheduled Meds: . baclofen  10 mg Oral TID  . chlorhexidine gluconate (MEDLINE KIT)  15 mL Mouth Rinse BID  . clonazepam  0.5 mg Oral BID  . collagenase   Topical Daily  . docusate  100 mg Oral BID  . enoxaparin (LOVENOX) injection  30 mg Subcutaneous QHS  . free water  100 mL Per Tube Q8H  . gabapentin  300 mg Per Tube TID  . insulin aspart  0-9 Units Subcutaneous Q4H  . ipratropium-albuterol  3 mL Nebulization Q6H  . mouth rinse  15 mL Mouth Rinse QID  . midodrine  10 mg Oral TID WC  . polyethylene glycol  17 g Per  Tube Daily  . ranitidine  150 mg Per Tube Q12H  . sennosides  5 mL Per Tube BID  . sodium chloride flush  3 mL Intravenous Q12H  . venlafaxine  75 mg Per Tube BID  . zolpidem  5 mg Oral QHS   Continuous Infusions: . feeding supplement (VITAL AF 1.2 CAL) 1,000 mL (11/23/15 0700)     LOS: 23 days    Time spent: 19mnutes.    XFlorencia Reasons MD PhD [facility] Pager [phone] If 7PM-7AM, please contact night-coverage [email or web address] Password [email or web address] 11/23/2015, 8:20 AM

## 2015-11-23 NOTE — Sedation Documentation (Signed)
Pt tolerated procedure well, will transport pt back to ICU

## 2015-11-23 NOTE — Progress Notes (Signed)
I assumed care of the patient at 1600. Pt was tranferred to IR for peg tube placement. Patient returned from IR in stasble condition. Her PEG tube is not to be used until tomorrow morning. Pt is no apparent distress and has 0/10 pain at this time. Will continue to monitor.

## 2015-11-23 NOTE — Progress Notes (Signed)
Date:  November 23, 2015 Chart reviewed for concurrent status and case management needs. Will continue to follow the patient for changes and needs: requiring suctioning q 1hour due to heavy secretions/will stay in sdu due to this. Discharge Planning: following for needs Velva Harman, BSN, [place], [place]

## 2015-11-23 NOTE — Sedation Documentation (Signed)
Patient is resting comfortably. No complaints from pt at this time.  

## 2015-11-23 NOTE — Progress Notes (Signed)
  Echocardiogram 2D Echocardiogram has been performed.  Darlina Sicilian M 11/23/2015, 1:02 PM

## 2015-11-23 NOTE — Progress Notes (Signed)
Nutrition Follow-up  DOCUMENTATION CODES:   Non-severe (moderate) malnutrition in context of acute illness/injury, Underweight  INTERVENTION:  - If PEG placed, will order 30 mL Prostat BID and Osmolite 1.2 @ 30 mL/hr to advance by 10 mL every 4 hours to reach goal rate of Osmolite 1.2 @ 60 mL/hr. At goal rate, this regimen will provide 1928 kcal, 110 grams of protein, and 1181 mL free water.  - RD will follow-up 11/6.  NUTRITION DIAGNOSIS:   Inadequate oral intake related to inability to eat as evidenced by NPO status. -ongoing  GOAL:   Patient will meet greater than or equal to 90% of their needs -previously met with TF regimen.  MONITOR:   Weight trends, Labs, Skin, I & O's, Other (Comment) (PEG placement, TF re-start)  ASSESSMENT:   45 y.o. 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 Presented with worsening confusion status decreased by mouth intake foul-smelling decubitus ulcer. Apparently has been more sedated than usual at home; family did endorse that patient may have possibly received more narcotics than usual. Family also states that she has had decreased by mouth intake. Mother was very concerned that patient developed lung cancer based on the fact that her father died of it and want patient to be tested extensively. In ED patient required Narcan 2 and responded: suggestive of opioid overdose contributing to oversedation  11/3 Pt continues with trach collar. Diet advanced to CLD 11/1 at 1127 and no intakes documented since that time. Pt sleeping at time of RD visit with no family/visitors present. Per Dr. Phoebe Sharps note this AM, pt required suctioning after attempting to consume jello and was subsequently made NPO. Note also  states ileus now resolving. TF off at time of RD visit. Spoke with RN who reports tentative plan for PEG today and TF turned off in anticipation.   Will order TF as outlined above s/p PEG placement versus via [place]anda if PEG unable to be placed. Change in formula is in anticipation of d/c and forseeable insurance coverage of TF formula. Weight -3.6 kg from 11/1-11/2; will continue to monitor weight trends.   Medications reviewed; PRN Dulcolax, 100 mg Colace BID, sliding scale Novolog, PRN IV Zofran, 17 g Miralax/day, PRN Senokot. Labs reviewed; CBGs: 99-122 mg/dL this AM, Cl: 97 mmol/L, BUN: 27 mg/dL, creatinine: 0.37 mg/dL.    11/1 - Pt continues with NGT and Vital 1.2 @ 65 mL/hr which is providing 1872 kcal, 117 grams of protein, and 1265 mL free water.  - Free water flush: 100 mL TID.  - Pt working with SLP with PMV at time of RD visit.  - Pt denies any nutrition-related questions or concerns at this time.  - IR has been consulted for PEG placement; note from this AM from IR reviewed.  - Weight stable from yesterday.   10/30 - Pt continues with NGT.  - SLP note from 10/27 PM reviewed.  - SLP working with pt this AM at time of RD visit.  - Spoke with SLP who reports plan for CLD via teaspoon starting today.  - Will monitor for possible repeat swallow evaluation versus plan for PEG placement.  - Order in place for Vital 1.2 @ 65 mL/hr.  - Weight -13.9 kg since 10/27 assessment.  - Estimated nutrition needs adjusted slightly; needs remain elevated d/t  multiple stage 4 wounds.  - At time of RD visit, BP: 97/53 and MAP: 66.    Diet Order:  Diet NPO time specified Except for: Ice Chips  Skin:     Last BM:  10/29  Height:   Ht Readings from Last 1 Encounters:  11/07/15 _0  (1.575 m)    Weight:   Wt Readings from Last 1 Encounters:  11/22/15 86 lb 6.7 oz (39.2 kg)    Ideal Body Weight:  47.5 kg  BMI:  Body mass index is 15.81 kg/m.  Estimated Nutritional Needs:    Kcal:  1816-2043 (40-45 kcal/kg)  Protein:  100-114 grams (2.2-2.5 grams/kg)  Fluid:  >/= 1.8 L/day  EDUCATION NEEDS:   No education needs identified at this time    Jarome Matin, MS, RD, LDN Inpatient Clinical Dietitian Pager # [phone] After hours/weekend pager # [phone]

## 2015-11-23 NOTE — Procedures (Signed)
Interventional Radiology Procedure Note  Procedure: Placement of percutaneous 20F pull-through gastrostomy tube. Complications: None Recommendations: - NPO except for sips and chips remainder of today and overnight - Maintain G-tube to LWS until tomorrow morning  - May advance diet as tolerated and begin using tube tomorrow morning  Signed,   Shermaine Rivet S. Parrish Bonn, DO   

## 2015-11-24 ENCOUNTER — Inpatient Hospital Stay (HOSPITAL_COMMUNITY): Payer: Medicaid Other

## 2015-11-24 LAB — BASIC METABOLIC PANEL
Anion gap: 11 (ref 5–15)
BUN: 28 mg/dL — AB (ref 6–20)
CALCIUM: 10.3 mg/dL (ref 8.9–10.3)
CO2: 30 mmol/L (ref 22–32)
CREATININE: 0.37 mg/dL — AB (ref 0.44–1.00)
Chloride: 97 mmol/L — ABNORMAL LOW (ref 101–111)
GFR calc Af Amer: >60 mL/min (ref >60)
GFR calc non Af Amer: >60 mL/min (ref >60)
GLUCOSE: 100 mg/dL — AB (ref 65–99)
Potassium: 4.3 mmol/L (ref 3.5–5.1)
Sodium: 138 mmol/L (ref 135–145)

## 2015-11-24 LAB — GLUCOSE, CAPILLARY
GLUCOSE-CAPILLARY: 96 mg/dL (ref 65–99)
GLUCOSE-CAPILLARY: 84 mg/dL (ref 65–99)
Glucose-Capillary: 102 mg/dL — ABNORMAL HIGH (ref 65–99)
Glucose-Capillary: 107 mg/dL — ABNORMAL HIGH (ref 65–99)
Glucose-Capillary: 124 mg/dL — ABNORMAL HIGH (ref 65–99)
Glucose-Capillary: 91 mg/dL (ref 65–99)
Glucose-Capillary: 95 mg/dL (ref 65–99)

## 2015-11-24 MED ORDER — BACITRACIN-NEOMYCIN-POLYMYXIN 400-5-5000 EX OINT
1.0000 "application " | TOPICAL_OINTMENT | Freq: Every day | CUTANEOUS | Status: DC
  Administered 2015-11-25 – 2015-11-27 (×3): 1 via TOPICAL

## 2015-11-24 MED ORDER — SORBITOL 70 % SOLN
960.0000 mL | TOPICAL_OIL | Freq: Once | ORAL | Status: AC
  Administered 2015-11-24: 960 mL via RECTAL
  Filled 2015-11-24: qty 240

## 2015-11-24 NOTE — Progress Notes (Signed)
RT placed PT on new ATC set up at 5 lpm for 28%. Sp02 100, hr 91, rr 16, bbs coarse-dim. RN aware.

## 2015-11-24 NOTE — Progress Notes (Signed)
SLP Cancellation Note  Patient Details Name: Angela Page MRN: [id number] DOB: Jun 18, 1970   Cancelled treatment:       Reason Eval/Treat Not Completed: Other (comment) (pt npo, s/p feeding tube placement, will continue efforts)   [name], [facility] SLP [phone]

## 2015-11-24 NOTE — Progress Notes (Signed)
PROGRESS NOTE    Angela Page  XTK:[id number] DOB: Apr 17, 1970 DOA: 10/31/2015 PCP: PROVIDER NOT IN SYSTEM   Brief Narrative: 45 y.o. female with medical history significant of traumatic spinal injury with paraplegia s/p traumatic fall with C2-7 posterior fusion 10/08/13 , recurrent UTI, decubitus ulcer, malnutrition, recurrent sepsis, Sick sinus syndrome status post [employer]tronic dual Chamber Pacemaker placement 11/03/13, presented with worsening confusion, decreased by mouth intake, foul-smelling decubitus ulcer.  She developed progressive hypotension and respiratory failure from HCAP and a UTI leading to septic shock. Completed antibiotics course. Patient is extubated. Evaluation for nutrition ongoing. CCM transferred to [facility] on 11/17/2015, on 17th days of inpatient hospitalization.  Today, patient agreed for gastric tube placement. Meantime, she likes to continue jello or clear diet as tolerated. I spoke with Dr. Earleen Newport from IR for G tube placement.  Assessment & Plan:   #Acute hypoxic/hypercapnic respiratory failure secondary to aspiration pna/MDRO ACINETOBACTER UTI/septic shock/metabolic encephalopathy Patient was admitted on 10/11 with altered mental status , sepsis from uit/pna, the next day she was put on pressor and intubated due to worsening of mental status and developed septic shock required pressure.  She failed extubation and had to be reintubated on 10/17night, s/p trach on 10/23,   Improved. Patient completed antibiotics course as per medical record  Pulm recommended trach OK to change to #6 cuffless prior to discharge -continue supportive care. Suctions and trach care Increase secretion noticed on 11/3, repeat cxr with decreased left pleural effusion, but persistent right effusion, chest vest  #ileus: patient denies abdominal pain, no n/v, tolerating tube feeds. Report + flatus, no bm   keep k>4, mag>2,  kub daily showed  ileus has improved, s/p Peg placement by  interventional radiology on 11/3 gtube on suction, will get enema due to no bm, will repeat kub in pm, may start tube feeds if kub unremarkable on 11/4 pm  # Protein-calorie malnutrition, severe ([place]): Speech and swallow evaluation note reviewed.  aspiration risk appeared to be both pharyngeal and esophageal. As respiratory suctioned out jello on 11/3, now npo except ice chips  s/p PEG tube placement on 11/3 Speech and swallow team, dietary consult appreciated.  "30 mL Prostat BID and Osmolite 1.2 @ 30 mL/hr to advance by 10 mL every 4 hours to reach goal rate of Osmolite 1.2 @ 60 mL/hr"   Chronic Decubitus ulcer of sacral region, stage 4 (HCC)/chronic coccyx osteomyelitis. Wound care     Quadriplegia, C5-C7 incomplete ([place]): bedridden, indwelling catheter (being changed qmonth, last changed on 10/30) Continue baclofen and supportive care  H/o SSS s/p pacemaker, chronic asymptomatic hypotension( autonomic instability). Her home midodrine was continued.      Severe sepsis ([place])     Lower urinary tract infectious disease   Acute encephalopathy   History of asthma and tobacco abuse   Ileus: Improved   Hyperglycemia due to steroid   DVT prophylaxis: Lovenox subcutaneous Code Status: Full code Family Communication: No family present at bedside Disposition Plan: Currently at stepdown. Likely discharge  home in 1-2 days after gastric tube placement  discharge patient home with home care. Patient is declining SNF at this time.  Consultants:   Pulmonary critical care.  Interventional radiologist  Palliative care  Procedures: Lt femoral aline 10/13 >> 10/14 ETT 10/13 >> 10/17, 10/18 >>  Lt IJ CVL 10/13 >> 10/26  SIGNIFICANT EVENTS: 10/11  Admit 10/13  VDRF, pressors, bronch 10/15  Vasopressors off  10/16  Fever, weaning on PSV 10/5 10/17  Extubated, failed and  reintubated overnight 10/20  Weaning on PSV, plan for trach 10/23 10/23 Tracheostomy  Antimicrobials: Vancomycin  10/11 >> 10/15 Levaquin 10/11 >> 10/12 Aztreonam 10/11 >> 10/12  Diflucan 10/12 >> 10/14 Primaxin 10/12 >> 10/22  STUDIES:  CT chest 10/12 >> occlusion of Rt bronchus intermedius, basilar consolidation, small Rt effusion Bronchoscopy 10/13 >> significant purulent secretions airways CT Pelvis 10/16 >> no evidence of emphysematous fasciitis along the perineum, stage IV decubitus ulcers along the sacrum / ischial tuberosities associated with chronic osteomyelitis CT abdomen 10/24 > ileus, anasarca  CULTURES: Blood 10/11 >> neg Urine 10/11 >> Acinetobacter, yeast Sputum 10/13 >> neg  Subjective:   patient was seen and examined at bedside On 5liter oxygen through trach,  S/p peg tube placement, denies pain, no n/v,  + flatus, no bm for the last three days  Denied fever She asks for ice chips   Objective: Vitals:   11/24/15 0400 11/24/15 0412 11/24/15 0500 11/24/15 0635  BP: 96/61     Pulse:      Resp: 16     Temp:  97.2 F (36.2 C)    TempSrc:  Axillary    SpO2: 94%   100%  Weight:   39.8 kg (87 lb 11.9 oz)   Height:        Intake/Output Summary (Last 24 hours) at 11/24/15 0757 Last data filed at 11/24/15 0514  Gross per 24 hour  Intake           199.42 ml  Output             1100 ml  Net          -900.58 ml   Filed Weights   11/22/15 0045 11/22/15 0344 11/24/15 0500  Weight: 39.2 kg (86 lb 6.7 oz) 39.2 kg (86 lb 6.7 oz) 39.8 kg (87 lb 11.9 oz)    Examination:  General exam:Cachectic female, not in distress Neck: Trachea stoma site clean with no bleeding. Respiratory system: diminished at basis, no wheeze Cardiovascular system: Regular rate rhythm, S1 and S2 normal. No pedal edema, s/p pacemaker Gastrointestinal system: Bowel sound positive, abdomen soft, nontender and not distended, s/p peg placement Central nervous system: Alert, awake, following commands and oriented. Extremities: paraplegia Skin: chronic sacral decubitus ulcer Psychiatry: Judgement and  insight appear normal. Mood & affect appropriate.     Data Reviewed: I have personally reviewed following labs and imaging studies  CBC:  Recent Labs Lab 11/17/15 0826 11/21/15 0323 11/22/15 0309  WBC 12.8* 8.0 8.3  NEUTROABS  --  5.7  --   HGB 9.4* 7.9* 8.7*  HCT 29.0* 25.2* 27.6*  MCV 95.4 97.7 98.2  PLT 485* 366 500   Basic Metabolic Panel:  Recent Labs Lab 11/17/15 0826 11/18/15 2144 11/22/15 0309 11/23/15 0323 11/24/15 0328  NA 138 140 139 139 138  K 3.8 3.6 4.9 4.5 4.3  CL 103 103 98* 97* 97*  CO2 _0 GLUCOSE 117* 116* 124* 100* 100*  BUN _1 27* 28*  CREATININE <0.30* <0.30* <0.30* 0.37* 0.37*  CALCIUM 8.5* 8.9 10.0 9.9 10.3  MG 2.2  --  2.4  --   --    GFR: Estimated Creatinine Clearance: 55.8 mL/min (by C-G formula based on SCr of 0.37 mg/dL (L)). Liver Function Tests: No results for input(s): AST, ALT, ALKPHOS, BILITOT, PROT, ALBUMIN in the last 168 hours. No results for input(s): LIPASE, AMYLASE in the last 168 hours. No results for input(s): AMMONIA  in the last 168 hours. Coagulation Profile:  Recent Labs Lab 11/21/15 0323  INR 0.87   Cardiac Enzymes: No results for input(s): CKTOTAL, CKMB, CKMBINDEX, TROPONINI in the last 168 hours. BNP (last 3 results) No results for input(s): PROBNP in the last 8760 hours. HbA1C: No results for input(s): HGBA1C in the last 72 hours. CBG:  Recent Labs Lab 11/23/15 1220 11/23/15 1702 11/23/15 2021 11/24/15 0015 11/24/15 0410  GLUCAP 87 90 86 107* 102*   Lipid Profile: No results for input(s): CHOL, HDL, LDLCALC, TRIG, CHOLHDL, LDLDIRECT in the last 72 hours. Thyroid Function Tests: No results for input(s): TSH, T4TOTAL, FREET4, T3FREE, THYROIDAB in the last 72 hours. Anemia Panel: No results for input(s): VITAMINB12, FOLATE, FERRITIN, TIBC, IRON, RETICCTPCT in the last 72 hours. Sepsis Labs: No results for input(s): PROCALCITON, LATICACIDVEN in the last 168 hours.  No results  found for this or any previous visit (from the past 240 hour(s)).       Radiology Studies: Dg Chest 1 View  Result Date: 11/23/2015 CLINICAL DATA:  Increased tracheal secretions. EXAM: CHEST 1 VIEW COMPARISON:  11/14/2015 FINDINGS: The tracheostomy tube tip is above the carina. The feeding tube tip is below the field of view within the upper abdomen. There is a left chest wall pacer device with leads in the right atrial appendage and right ventricle. Normal heart size. There has been interval decrease in volume of left effusion. Persistent right pleural effusion. IMPRESSION: 1. Decrease in volume of left pleural effusion. 2. Persistent right effusion. Electronically Signed   By: Kerby Moors M.D.   On: 11/23/2015 09:53   Dg Abd 1 View  Result Date: 11/23/2015 CLINICAL DATA:  Ileus. EXAM: ABDOMEN - 1 VIEW COMPARISON:  11/22/2015.  11/21/2015. FINDINGS: Dobhoff tube noted with tip projected over stomach. Soft tissue structures are unremarkable. Oral contrast noted throughout the colon. Again previously identified small bowel distention has improved. No free air. Pelvic calcifications consistent phleboliths. Surgical clips in the pelvis. IMPRESSION: 1.Dobhoff tube in stable position with tip in the stomach. No gastric distention. 2. Oral contrast in the colon. Again previously identified small bowel distention has improved . Electronically Signed   By: Marcello Moores  Register   On: 11/23/2015 07:10   Ir Gastrostomy Francesco Runner Mod Sed  Result Date: 11/23/2015 INDICATION: 45 year old female with a history of dysphagia. EXAM: PERC PLACEMENT GASTROSTOMY MEDICATIONS: Vancomycin 1 gm IV; Antibiotics were administered within 1 hour of the procedure. ANESTHESIA/SEDATION: Versed 1.0 mg IV; Fentanyl 50 mcg IV Moderate Sedation Time:  13 The patient was continuously monitored during the procedure by the interventional radiology nurse under my direct supervision. CONTRAST:  20m ISOVUE-300 IOPAMIDOL (ISOVUE-300) INJECTION  61% - administered into the gastric lumen. FLUOROSCOPY TIME:  Fluoroscopy Time: 3 minutes 12 seconds (10 mGy). COMPLICATIONS: None PROCEDURE: Informed written consent was obtained from the patient's family after a thorough discussion of the procedural risks, benefits and alternatives. All questions were addressed. Maximal Sterile Barrier Technique was utilized including caps, mask, sterile gowns, sterile gloves, sterile drape, hand hygiene and skin antiseptic. A timeout was performed prior to the initiation of the procedure. The procedure, risks, benefits, and alternatives were explained to the patient. Questions regarding the procedure were encouraged and answered. The patient understands and consents to the procedure. The epigastrium was prepped with Betadine in a sterile fashion, and a sterile drape was applied covering the operative field. A sterile gown and sterile gloves were used for the procedure. A 5-French orogastric tube is placed under fluoroscopic  guidance. Scout imaging of the abdomen confirms barium within the transverse colon. The stomach was distended with gas. Under fluoroscopic guidance, an 18 gauge needle was utilized to puncture the anterior wall of the body of the stomach. An Amplatz wire was advanced through the needle passing a T fastener into the lumen of the stomach. The T fastener was secured for gastropexy. A 9-French sheath was inserted. A snare was advanced through the 9-French sheath. A Britta Mccreedy was advanced through the orogastric tube. It was snared then pulled out the oral cavity, pulling the snare, as well. The leading edge of the gastrostomy was attached to the snare. It was then pulled down the esophagus and out the percutaneous site. It was secured in place. Contrast was injected. No complication IMPRESSION: Status post fluoroscopic placed percutaneous gastrostomy tube, with 20 [place] pull-through. Signed, Dulcy Fanny. Earleen Newport, DO [facility]  [facility] Radiology Electronically Signed   By: Corrie Mckusick D.O.   On: 11/23/2015 16:24        Scheduled Meds: . baclofen  10 mg Oral TID  . chlorhexidine gluconate (MEDLINE KIT)  15 mL Mouth Rinse BID  . clonazepam  0.5 mg Oral BID  . collagenase   Topical Daily  . docusate  100 mg Oral BID  . enoxaparin (LOVENOX) injection  30 mg Subcutaneous QHS  . feeding supplement (PRO-STAT SUGAR FREE 64)  30 mL Per Tube BID  . free water  100 mL Per Tube Q8H  . gabapentin  300 mg Per Tube TID  . insulin aspart  0-9 Units Subcutaneous Q4H  . ipratropium-albuterol  3 mL Nebulization Q6H  . mouth rinse  15 mL Mouth Rinse QID  . midodrine  10 mg Oral TID WC  . polyethylene glycol  17 g Per Tube Daily  . ranitidine  150 mg Per Tube Q12H  . sennosides  5 mL Per Tube BID  . sodium chloride flush  3 mL Intravenous Q12H  . venlafaxine  75 mg Per Tube BID  . zolpidem  5 mg Oral QHS   Continuous Infusions: . feeding supplement (OSMOLITE 1.2 CAL)       LOS: 24 days    Time spent: 41mnutes.    XFlorencia Reasons MD PhD [facility] Pager [phone] If 7PM-7AM, please contact night-coverage [email or web address] Password [email or web address] 11/24/2015, 7:57 AM

## 2015-11-25 LAB — CBC
HCT: 27.1 % — ABNORMAL LOW (ref 36.0–46.0)
Hemoglobin: 8.4 g/dL — ABNORMAL LOW (ref 12.0–15.0)
MCH: 31 pg (ref 26.0–34.0)
MCHC: 31 g/dL (ref 30.0–36.0)
MCV: 100 fL (ref 78.0–100.0)
PLATELETS: 392 10*3/uL (ref 150–400)
RBC: 2.71 MIL/uL — ABNORMAL LOW (ref 3.87–5.11)
RDW: 19.5 % — AB (ref 11.5–15.5)
WBC: 7.7 10*3/uL (ref 4.0–10.5)

## 2015-11-25 LAB — GLUCOSE, CAPILLARY
GLUCOSE-CAPILLARY: 152 mg/dL — AB (ref 65–99)
GLUCOSE-CAPILLARY: 135 mg/dL — AB (ref 65–99)
GLUCOSE-CAPILLARY: 133 mg/dL — AB (ref 65–99)
Glucose-Capillary: 103 mg/dL — ABNORMAL HIGH (ref 65–99)
Glucose-Capillary: 95 mg/dL (ref 65–99)
Glucose-Capillary: 88 mg/dL (ref 65–99)
Glucose-Capillary: 88 mg/dL (ref 65–99)

## 2015-11-25 LAB — BASIC METABOLIC PANEL
Anion gap: 10 (ref 5–15)
BUN: 30 mg/dL — AB (ref 6–20)
CALCIUM: 9.8 mg/dL (ref 8.9–10.3)
CO2: 31 mmol/L (ref 22–32)
CREATININE: 0.35 mg/dL — AB (ref 0.44–1.00)
Chloride: 98 mmol/L — ABNORMAL LOW (ref 101–111)
Glucose, Bld: 142 mg/dL — ABNORMAL HIGH (ref 65–99)
Potassium: 3.9 mmol/L (ref 3.5–5.1)
SODIUM: 139 mmol/L (ref 135–145)

## 2015-11-25 LAB — MAGNESIUM: MAGNESIUM: 2.2 mg/dL (ref 1.7–2.4)

## 2015-11-25 MED ORDER — CHLORHEXIDINE GLUCONATE 0.12 % MT SOLN
OROMUCOSAL | Status: AC
  Administered 2015-11-25: 15 mL via OROMUCOSAL
  Filled 2015-11-25: qty 15

## 2015-11-25 NOTE — Progress Notes (Signed)
RT placed PT on new ATC at 28% (6 lpm).

## 2015-11-25 NOTE — Progress Notes (Signed)
PROGRESS NOTE    Angela Page  MRN:[id number] DOB: 06/01/1970 DOA: 10/31/2015 PCP: PROVIDER NOT IN SYSTEM   Brief Narrative: 45 y.o. female with medical history significant of traumatic spinal injury with paraplegia s/p traumatic fall with C2-7 posterior fusion 10/08/13 , recurrent UTI, decubitus ulcer, malnutrition, recurrent sepsis, Sick sinus syndrome status post Medtronic dual Chamber Pacemaker placement 11/03/13, presented with worsening confusion, decreased by mouth intake, foul-smelling decubitus ulcer.  She developed progressive hypotension and respiratory failure from HCAP and a UTI leading to septic shock. Completed antibiotics course. Patient is extubated. Evaluation for nutrition ongoing. CCM transferred to [facility] on 11/17/2015, on 17th days of inpatient hospitalization.  S/p trach, peg, indwelling foley  Assessment & Plan:   #Acute hypoxic/hypercapnic respiratory failure secondary to aspiration pna/MDRO ACINETOBACTER UTI/septic shock/metabolic encephalopathy Patient was admitted on 10/11 with altered mental status , sepsis from uit/pna, the next day she was put on pressor and intubated due to worsening of mental status and developed septic shock required pressure.  She failed extubation and had to be reintubated on 10/17night, s/p trach on 10/23,   Improved. Patient completed antibiotics course as per medical record  Pulm recommended trach OK to change to #6 cuffless prior to discharge -continue supportive care. Suctions and trach care Increase secretion noticed on 11/3, repeat cxr with decreased left pleural effusion, but persistent right effusion, chest vest continue nutrition support, not able to use lasix due to borderline bp, Improving, stable  #ileus: patient denies abdominal pain, no n/v, tolerating tube feeds. Report + flatus, no bm   keep k>4, mag>2,  kub daily showed  ileus has improved, s/p Peg placement by interventional radiology on 11/3 gtube on suction briefly,  s/p enema with good result, repeat kub ileus resolved ,  tube feeds started on 11/4 pm  # Protein-calorie malnutrition, severe (HCC): Speech and swallow evaluation note reviewed.  aspiration risk appeared to be both pharyngeal and esophageal. As respiratory suctioned out jello on 11/3, now npo except ice chips  s/p PEG tube placement on 11/3 Speech and swallow team, dietary consult appreciated.  "30 mL Prostat BID and Osmolite 1.2 @ 30 mL/hr to advance by 10 mL every 4 hours to reach goal rate of Osmolite 1.2 @ 60 mL/hr"   Chronic Decubitus ulcer of sacral region, stage 4 (HCC)/chronic coccyx osteomyelitis. Wound care, has indwelling foley.     Quadriplegia, C5-C7 incomplete (HCC): bedridden, indwelling catheter (being changed qmonth, last changed on 10/30) Continue baclofen and supportive care  H/o SSS s/p pacemaker, chronic asymptomatic hypotension( autonomic instability). Her home midodrine was continued.     Chronic pain: on baclofen, neurontin, prn oxycodone, patient asked to increase oxycodone to 2tabs every 4hrs, I have explained to her with her poor respiratory status and borderline blood pressure, it is not safe to increase oxycodone, in fact , she looks comfortable on exam on iv morphine img 2-3 times a day.    Severe sepsis (HCC)     Lower urinary tract infectious disease   Acute encephalopathy   History of asthma and tobacco abuse   Ileus: Improved   Hyperglycemia due to steroid   DVT prophylaxis: Lovenox subcutaneous Code Status: Full code Family Communication: No family present at bedside Disposition Plan:  discharge  [facility].  Patient is declining SNF at this time.  Consultants:   Pulmonary critical care.  Interventional radiologist  Palliative care  Procedures: Lt femoral aline 10/13 >> 10/14 ETT 10/13 >> 10/17, 10/18 >>    Lt IJ CVL 10/13 >> 10/26 Trach 10/23 Peg 11/3  SIGNIFICANT EVENTS: 10/11  Admit 10/13  VDRF,  pressors, bronch 10/15  Vasopressors off  10/16  Fever, weaning on PSV 10/5 10/17  Extubated, failed and reintubated overnight 10/20  Weaning on PSV, plan for trach 10/23 10/23 Tracheostomy  Antimicrobials: Vancomycin 10/11 >> 10/15 Levaquin 10/11 >> 10/12 Aztreonam 10/11 >> 10/12  Diflucan 10/12 >> 10/14 Primaxin 10/12 >> 10/22  STUDIES:  CT chest 10/12 >> occlusion of Rt bronchus intermedius, basilar consolidation, small Rt effusion Bronchoscopy 10/13 >> significant purulent secretions airways CT Pelvis 10/16 >> no evidence of emphysematous fasciitis along the perineum, stage IV decubitus ulcers along the sacrum / ischial tuberosities associated with chronic osteomyelitis CT abdomen 10/24 > ileus, anasarca  CULTURES: Blood 10/11 >> neg Urine 10/11 >> Acinetobacter, yeast Sputum 10/13 >> neg  Subjective:   patient was seen and examined at bedside On 5liter oxygen through trach,  S/p peg tube placement, tolerating tube feeds at 50cc/hr currently,  denies pain, no n/v,  + flatus, had bm after enema.  no fever   Objective: Vitals:   11/25/15 0400 11/25/15 0445 11/25/15 0451 11/25/15 0600  BP: 100/74 100/74  97/64  Pulse:  80    Resp: 16 18  13  Temp: 97.7 F (36.5 C)     TempSrc: Axillary     SpO2: 96% 98%  100%  Weight:   42.5 kg (93 lb 11.1 oz)   Height:        Intake/Output Summary (Last 24 hours) at 11/25/15 0740 Last data filed at 11/25/15 0622  Gross per 24 hour  Intake              353 ml  Output              900 ml  Net             -547 ml   Filed Weights   11/22/15 0344 11/24/15 0500 11/25/15 0451  Weight: 39.2 kg (86 lb 6.7 oz) 39.8 kg (87 lb 11.9 oz) 42.5 kg (93 lb 11.1 oz)    Examination:  General exam:Cachectic female, not in distress, s/p trach, s/p peg, indwelling foley Neck: Trachea stoma site clean with no bleeding. Respiratory system: diminished at basis, no wheeze Cardiovascular system: Regular rate rhythm, S1 and S2 normal. No pedal  edema, s/p pacemaker Gastrointestinal system: Bowel sound positive, abdomen soft, nontender and not distended, s/p peg placement Central nervous system: Alert, awake, following commands and oriented. Extremities: paraplegia Skin: chronic sacral decubitus ulcer Psychiatry: Judgement and insight appear normal. Mood & affect appropriate.     Data Reviewed: I have personally reviewed following labs and imaging studies  CBC:  Recent Labs Lab 11/21/15 0323 11/22/15 0309 11/25/15 0326  WBC 8.0 8.3 7.7  NEUTROABS 5.7  --   --   HGB 7.9* 8.7* 8.4*  HCT 25.2* 27.6* 27.1*  MCV 97.7 98.2 100.0  PLT 366 381 392   Basic Metabolic Panel:  Recent Labs Lab 11/18/15 2144 11/22/15 0309 11/23/15 0323 11/24/15 0328 11/25/15 0326  NA 140 139 139 138 139  K 3.6 4.9 4.5 4.3 3.9  CL 103 98* 97* 97* 98*  CO2 31 31 31 30 31  GLUCOSE 116* 124* 100* 100* 142*  BUN 12 20 27* 28* 30*  CREATININE <0.30* <0.30* 0.37* 0.37* 0.35*  CALCIUM 8.9 10.0 9.9 10.3 9.8  MG  --  2.4  --   --  2.2     GFR: Estimated Creatinine Clearance: 59.6 mL/min (by C-G formula based on SCr of 0.35 mg/dL (L)). Liver Function Tests: No results for input(s): AST, ALT, ALKPHOS, BILITOT, PROT, ALBUMIN in the last 168 hours. No results for input(s): LIPASE, AMYLASE in the last 168 hours. No results for input(s): AMMONIA in the last 168 hours. Coagulation Profile:  Recent Labs Lab 11/21/15 0323  INR 0.87   Cardiac Enzymes: No results for input(s): CKTOTAL, CKMB, CKMBINDEX, TROPONINI in the last 168 hours. BNP (last 3 results) No results for input(s): PROBNP in the last 8760 hours. HbA1C: No results for input(s): HGBA1C in the last 72 hours. CBG:  Recent Labs Lab 11/24/15 1210 11/24/15 1551 11/24/15 2024 11/24/15 2318 11/25/15 0343  GLUCAP 96 84 95 124* 152*   Lipid Profile: No results for input(s): CHOL, HDL, LDLCALC, TRIG, CHOLHDL, LDLDIRECT in the last 72 hours. Thyroid Function Tests: No results for  input(s): TSH, T4TOTAL, FREET4, T3FREE, THYROIDAB in the last 72 hours. Anemia Panel: No results for input(s): VITAMINB12, FOLATE, FERRITIN, TIBC, IRON, RETICCTPCT in the last 72 hours. Sepsis Labs: No results for input(s): PROCALCITON, LATICACIDVEN in the last 168 hours.  No results found for this or any previous visit (from the past 240 hour(s)).       Radiology Studies: Dg Chest 1 View  Result Date: 11/23/2015 CLINICAL DATA:  Increased tracheal secretions. EXAM: CHEST 1 VIEW COMPARISON:  11/14/2015 FINDINGS: The tracheostomy tube tip is above the carina. The feeding tube tip is below the field of view within the upper abdomen. There is a left chest wall pacer device with leads in the right atrial appendage and right ventricle. Normal heart size. There has been interval decrease in volume of left effusion. Persistent right pleural effusion. IMPRESSION: 1. Decrease in volume of left pleural effusion. 2. Persistent right effusion. Electronically Signed   By: Kerby Moors M.D.   On: 11/23/2015 09:53   Dg Abd 1 View  Result Date: 11/24/2015 CLINICAL DATA:  Ileus.  Abdominal distention. EXAM: ABDOMEN - 1 VIEW COMPARISON:  11/23/2015 FINDINGS: Percutaneous gastrostomy tube is now seen in place. Feeding tube remains in place with tip in the body the stomach. Residual oral contrast is seen within the colon. There is no evidence of dilated bowel loops or bowel obstruction. IMPRESSION: Percutaneous gastrostomy tube and feeding tube both seen within the body the stomach. Nonobstructive bowel gas pattern, with residual oral contrast seen and colon. Electronically Signed   By: Earle Gell M.D.   On: 11/24/2015 15:58   Ir Gastrostomy Tube Mod Sed  Result Date: 11/23/2015 INDICATION: 45 year old female with a history of dysphagia. EXAM: PERC PLACEMENT GASTROSTOMY MEDICATIONS: Vancomycin 1 gm IV; Antibiotics were administered within 1 hour of the procedure. ANESTHESIA/SEDATION: Versed 1.0 mg IV; Fentanyl  50 mcg IV Moderate Sedation Time:  13 The patient was continuously monitored during the procedure by the interventional radiology nurse under my direct supervision. CONTRAST:  32m ISOVUE-300 IOPAMIDOL (ISOVUE-300) INJECTION 61% - administered into the gastric lumen. FLUOROSCOPY TIME:  Fluoroscopy Time: 3 minutes 12 seconds (10 mGy). COMPLICATIONS: None PROCEDURE: Informed written consent was obtained from the patient's family after a thorough discussion of the procedural risks, benefits and alternatives. All questions were addressed. Maximal Sterile Barrier Technique was utilized including caps, mask, sterile gowns, sterile gloves, sterile drape, hand hygiene and skin antiseptic. A timeout was performed prior to the initiation of the procedure. The procedure, risks, benefits, and alternatives were explained to the patient. Questions regarding the procedure were encouraged and  answered. The patient understands and consents to the procedure. The epigastrium was prepped with Betadine in a sterile fashion, and a sterile drape was applied covering the operative field. A sterile gown and sterile gloves were used for the procedure. A 5-French orogastric tube is placed under fluoroscopic guidance. Scout imaging of the abdomen confirms barium within the transverse colon. The stomach was distended with gas. Under fluoroscopic guidance, an 18 gauge needle was utilized to puncture the anterior wall of the body of the stomach. An Amplatz wire was advanced through the needle passing a T fastener into the lumen of the stomach. The T fastener was secured for gastropexy. A 9-French sheath was inserted. A snare was advanced through the 9-French sheath. A Britta Mccreedy was advanced through the orogastric tube. It was snared then pulled out the oral cavity, pulling the snare, as well. The leading edge of the gastrostomy was attached to the snare. It was then pulled down the esophagus and out the percutaneous site. It was secured in place.  Contrast was injected. No complication IMPRESSION: Status post fluoroscopic placed percutaneous gastrostomy tube, with 20 [place] pull-through. Signed, Dulcy Fanny. Earleen Newport, DO [facility] Electronically Signed   By: Corrie Mckusick D.O.   On: 11/23/2015 16:24        Scheduled Meds: . baclofen  10 mg Oral TID  . chlorhexidine gluconate (MEDLINE KIT)  15 mL Mouth Rinse BID  . clonazepam  0.5 mg Oral BID  . collagenase   Topical Daily  . docusate  100 mg Oral BID  . enoxaparin (LOVENOX) injection  30 mg Subcutaneous QHS  . feeding supplement (PRO-STAT SUGAR FREE 64)  30 mL Per Tube BID  . free water  100 mL Per Tube Q8H  . gabapentin  300 mg Per Tube TID  . insulin aspart  0-9 Units Subcutaneous Q4H  . ipratropium-albuterol  3 mL Nebulization Q6H  . mouth rinse  15 mL Mouth Rinse QID  . midodrine  10 mg Oral TID WC  . neomycin-bacitracin-polymyxin  1 application Topical Daily  . polyethylene glycol  17 g Per Tube Daily  . ranitidine  150 mg Per Tube Q12H  . sennosides  5 mL Per Tube BID  . sodium chloride flush  3 mL Intravenous Q12H  . venlafaxine  75 mg Per Tube BID  . zolpidem  5 mg Oral QHS   Continuous Infusions: . feeding supplement (OSMOLITE 1.2 CAL) 1,000 mL (11/24/15 1852)     LOS: 25 days    Time spent: 68mnutes.    XFlorencia Reasons MD PhD [facility] Pager [phone] If 7PM-7AM, please contact night-coverage [email or web address] Password [email or web address] 11/25/2015, 7:40 AM

## 2015-11-26 DIAGNOSIS — Z930 Tracheostomy status: Secondary | ICD-10-CM

## 2015-11-26 LAB — BASIC METABOLIC PANEL
Anion gap: 10 (ref 5–15)
BUN: 27 mg/dL — AB (ref 6–20)
CALCIUM: 9.7 mg/dL (ref 8.9–10.3)
CO2: 32 mmol/L (ref 22–32)
CREATININE: 0.33 mg/dL — AB (ref 0.44–1.00)
Chloride: 96 mmol/L — ABNORMAL LOW (ref 101–111)
GFR calc Af Amer: >60 mL/min (ref >60)
Glucose, Bld: 153 mg/dL — ABNORMAL HIGH (ref 65–99)
POTASSIUM: 4.1 mmol/L (ref 3.5–5.1)
SODIUM: 138 mmol/L (ref 135–145)

## 2015-11-26 LAB — GLUCOSE, CAPILLARY
GLUCOSE-CAPILLARY: 149 mg/dL — AB (ref 65–99)
GLUCOSE-CAPILLARY: 116 mg/dL — AB (ref 65–99)
GLUCOSE-CAPILLARY: 120 mg/dL — AB (ref 65–99)
Glucose-Capillary: 100 mg/dL — ABNORMAL HIGH (ref 65–99)
Glucose-Capillary: 102 mg/dL — ABNORMAL HIGH (ref 65–99)
Glucose-Capillary: 110 mg/dL — ABNORMAL HIGH (ref 65–99)

## 2015-11-26 MED ORDER — POLYETHYLENE GLYCOL 3350 17 G PO PACK: 17.0000 g | PACK | Freq: Every day | ORAL | 0 refills | Status: DC

## 2015-11-26 MED ORDER — MELATONIN 3 MG PO TABS: 6.0000 mg | ORAL_TABLET | Freq: Every evening | ORAL | 0 refills | Status: DC | PRN

## 2015-11-26 MED ORDER — FREE WATER
100.0000 mL | Freq: Four times a day (QID) | Status: DC
  Administered 2015-11-26 – 2015-11-27 (×5): 100 mL

## 2015-11-26 MED ORDER — BISACODYL 10 MG RE SUPP: 10.0000 mg | Freq: Every day | RECTAL | 0 refills | Status: DC | PRN

## 2015-11-26 MED ORDER — FREE WATER: 100.0000 mL | Freq: Three times a day (TID) | 0 refills | Status: DC

## 2015-11-26 MED ORDER — VENLAFAXINE HCL 75 MG PO TABS: 75.0000 mg | ORAL_TABLET | Freq: Two times a day (BID) | ORAL | 0 refills | Status: DC

## 2015-11-26 MED ORDER — MIDODRINE HCL 10 MG PO TABS: 10.0000 mg | ORAL_TABLET | Freq: Three times a day (TID) | ORAL | 0 refills | Status: DC

## 2015-11-26 MED ORDER — DOCUSATE SODIUM 50 MG/5ML PO LIQD: 100.0000 mg | Freq: Every day | ORAL | 0 refills | Status: DC

## 2015-11-26 MED ORDER — RANITIDINE HCL 150 MG/10ML PO SYRP: 150.0000 mg | ORAL_SOLUTION | Freq: Two times a day (BID) | ORAL | 0 refills | Status: DC

## 2015-11-26 MED ORDER — PRO-STAT SUGAR FREE PO LIQD: 30.0000 mL | Freq: Two times a day (BID) | ORAL | Status: DC

## 2015-11-26 MED ORDER — COLLAGENASE 250 UNIT/GM EX OINT: TOPICAL_OINTMENT | Freq: Every day | CUTANEOUS | 0 refills | Status: DC

## 2015-11-26 MED ORDER — SENNOSIDES 8.8 MG/5ML PO SYRP: 5.0000 mL | ORAL_SOLUTION | Freq: Every evening | ORAL | 0 refills | Status: DC | PRN

## 2015-11-26 MED ORDER — ACETAMINOPHEN 500 MG PO TABS: 500.0000 mg | ORAL_TABLET | Freq: Four times a day (QID) | ORAL | 0 refills | Status: DC | PRN

## 2015-11-26 MED ORDER — CVS VITAMIN B-12 1000 MCG PO TABS: 1000.0000 ug | ORAL_TABLET | Freq: Every day | ORAL | 0 refills | Status: DC

## 2015-11-26 MED ORDER — BACLOFEN 10 MG PO TABS: 10.0000 mg | ORAL_TABLET | Freq: Three times a day (TID) | ORAL | 0 refills | Status: DC

## 2015-11-26 MED ORDER — BACITRACIN-NEOMYCIN-POLYMYXIN 400-5-5000 EX OINT: 1.0000 "application " | TOPICAL_OINTMENT | Freq: Every day | CUTANEOUS | 0 refills | Status: DC

## 2015-11-26 MED ORDER — OXYCODONE HCL 5 MG PO TABS: 5.0000 mg | ORAL_TABLET | Freq: Four times a day (QID) | ORAL | 0 refills | Status: DC | PRN

## 2015-11-26 MED ORDER — OSMOLITE 1.5 CAL PO LIQD
1000.0000 mL | ORAL | Status: AC
  Filled 2015-11-26: qty 1000

## 2015-11-26 MED ORDER — GABAPENTIN 250 MG/5ML PO SOLN: 300.0000 mg | Freq: Three times a day (TID) | ORAL | 0 refills | Status: DC

## 2015-11-26 MED ORDER — CLONAZEPAM 0.5 MG PO TBDP: 0.5000 mg | ORAL_TABLET | Freq: Every day | ORAL | 0 refills | Status: DC

## 2015-11-26 MED ORDER — PRO-STAT SUGAR FREE PO LIQD: 30.0000 mL | Freq: Two times a day (BID) | ORAL | 0 refills | Status: DC

## 2015-11-26 MED ORDER — IBUPROFEN 200 MG PO TABS: 400.0000 mg | ORAL_TABLET | Freq: Four times a day (QID) | ORAL | 0 refills | Status: DC | PRN

## 2015-11-26 NOTE — Plan of Care (Signed)
Problem: Skin Integrity: Goal: Risk for impaired skin integrity will decrease Outcome: Adequate for Discharge Wounds clean wet to dry dsg done bid, no drainage noted

## 2015-11-26 NOTE — Progress Notes (Signed)
Patient tolerated RA without complication. O2 sats remained > or = to 92%. RT will continue to monitor patient.

## 2015-11-26 NOTE — Progress Notes (Signed)
Date:  November 26, 2015 [facility]f-Deb. [employer] has not approved service at this point. tct-Mother wait for the medicaid. TCT-Dr. Erlinda Hong updated on progress. Velva Harman, BSN, [place], [place]

## 2015-11-26 NOTE — Progress Notes (Signed)
45 y.o.femalewith medical history significant oftraumatic spinal injury with paraplegias/p traumatic fall with C2-7 posterior fusion 10/08/13 ,recurrent UTI, decubitus ulcer, malnutrition, recurrent sepsis, Sick sinus syndrome status post Medtronic dual Chamber Pacemaker placement 11/03/13  Adm 10/11 with septic shock, UTI & HCAP  S: denies pain, on ATC over weekend, speaks with PMV 'can I go home?'  O: Vitals:   11/26/15 0500 11/26/15 0600 11/26/15 0700 11/26/15 0726  BP: (!) 98/59 129/85    Pulse: 85   89  Resp: 13 19 18 18   Temp:      TempSrc:      SpO2: 98% 100% 98% 100%  Weight: 97 lb 10.6 oz (44.3 kg)     Height:       On exam: Chronically ill appearing, frail, thinno distress Lungs clear, normal effort, mild secretions CV: RRR, no mgr Belly soft, nontender   Impression/Plan:  Tracheostomy status > would not downsize, OK to change to #6 cuffless prior to discharge, would not consider decannulation until she has several months of stability without respiratory failure Dysphagia Quadriplegia Recent HCAP > resolved Protein calorie malnutrition> continue tube feedings, she remains at some risk for aspiration Acineobacter UTI , chr foley - per primary  Plan is for home discharge with home care & family PCCM available - please ensure FU with trach clinic on discharge   Kara Mead MD. FCCP. [facility] Pager [phone] If no response call [phone]   11/26/2015

## 2015-11-26 NOTE — Progress Notes (Signed)
RT x 2 attempted to change trach to #6 cuffless; meeting resistance. CCM NP paged to assist trach change.

## 2015-11-26 NOTE — Progress Notes (Signed)
Date:  November 26, 2015 TCt-Deborah Berline Lopes with [facility] message left to please call back/per the mother the trach supplies are at the house and patient is ready for dc via ptar. Velva Harman, BSN, [place], [place]

## 2015-11-26 NOTE — Progress Notes (Signed)
Date:  November 26, 2015 Discharge Planning: TCT-[place] letter of medical necessity faxed to Ruffin Pyo at [facility] that patient is going home today/tct-mother-informed Mother that patient is coming home today via ptar/tct-[facility] with [facility]/orders for dme received and called to [place].  Velva Harman, BSN, [place], [place]

## 2015-11-26 NOTE — Discharge Summary (Addendum)
Discharge Summary  Angela Page [id number] DOB: 07-06-1970  PCP: PROVIDER NOT IN SYSTEM  Admit date: 10/31/2015 Discharge date: 11/27/2015  Time spent: >33mins  Recommendations for Outpatient Follow-up:  1. F/u with PMD at [facility] within a week  for hospital discharge follow up, repeat cbc/bmp at follow up 2. F/u with trach clinic on 11/29 3. [facility], patient refused snf placement  Discharge Diagnoses:  Active Hospital Problems   Diagnosis Date Noted  . Palliative care encounter   . Goals of care, counseling/discussion   . Esophageal dysphagia   . Shortness of breath   . Acute respiratory failure with hypoxemia ([place])   . Encounter for intubation   . Malnutrition of moderate degree 11/01/2015  . Acute encephalopathy 10/31/2015  . Tobacco abuse 10/31/2015  . Asthma 10/31/2015  . Lower urinary tract infectious disease 02/25/2015  . Sepsis ([place]) 01/31/2015  . Leukocytosis 10/23/2014  . Decubitus ulcer of sacral region, stage 4 ([place]) 10/23/2014  . Severe sepsis ([place])   . Quadriplegia, C5-C7 incomplete ([place]) 10/22/2014  . Protein-calorie malnutrition, severe ([place]) 09/05/2014  . Overdose of opiate or related narcotic 09/02/2014    Resolved Hospital Problems   Diagnosis Date Noted Date Resolved  No resolved problems to display.    Discharge Condition: stable  Diet recommendation:  Continue tube feed (s/p trach, s/p peg placement)  Aspiration precaution, continue swallow eval periodically,   Diet recommendations: Thin liquid (with known aspiration risks) Liquids provided via: Teaspoon Medication Administration: Via alternative means Supervision: Full supervision/cueing for compensatory strategies Compensations: Slow rate;Small sips/bites;Multiple dry swallows after each bite/sip;Clear throat intermittently Postural Changes and/or Swallow Maneuvers: Seated upright 90 degrees;Upright 30-60 min after meal (as able with wounds)     Patient may use Passy-Muir Speech Valve:  (with RN and SLP) PMSV Supervision: Full  Oral Care Recommendations: Oral care BID Follow up Recommendations: Home health SLP;         Filed Weights   11/25/15 0451 11/26/15 0500 11/27/15 0500  Weight: 42.5 kg (93 lb 11.1 oz) 44.3 kg (97 lb 10.6 oz) 43.1 kg (95 lb 0.3 oz)    History of present illness: ( per admitting MD [place]) Patient coming from:   home Lives   With family    Chief Complaint: Decreased mentation confusion  HPI: Angela Page is a 45 y.o. female with medical history significant of traumatic spinal injury with paraplegia s/p traumatic fall with C2-7 posterior fusion 10/08/13 , recurrent UTI, decubitus ulcer, malnutrition, recurrent sepsis, Sick sinus syndrome status post Medtronic dual Chamber Pacemaker placement 11/03/13, Tobacco abuse     Presented with worsening confusion status decreased by mouth intake foul-smelling decubitus ulcer. Patient unable to provide her own history mother not at bedside currently history obtained through review of records and through ER provider. Apparently has been more sedated than usual at home family did endorse that patient may have possibly received more narcotics than usual. Family also states that she has had decreased by mouth intake. That they sometimes that her [place] her mother's oxygen. Her  mother occasionally provide patient with cigarettes. Mother was very concerned that patient developed lung cancer based on the fact that her father died of it and want patient to be tested extensively. An emerge department patient required Narcan 2 and responded suggestive opioid overdose contributing to oversedation  Regarding pertinent Chronic problems: Patient has recently been admitted to [facility]  for pneumonia. On the second for September patient had CODE BLUE required defibrillation. She was  treated with Aztreonam 9/2 --> 9/3 Vancomycin 9/2 --> 9/3 Maxipime 9/3  -->changed to Levaquin 9/7 to complete therapy until 9/8 discharged on September 8 on Levaquin. During admission CXR on September 4 showed right lower lobe atelectasis, volume loss versus underlying infection she also had left lower lobe patchy opacity versus pneumonia.  blood culture resulting as MRSA.  Patient was treated for sepsis. Patient in the past had to be on ventilator for 3 months and status post tracheostomy but currently has healed. Patient has history of tobacco abuse and asthma. Her parents continue to give [name] some cigarettes a smoker. Patient has indwelling Foley catheter with associated urinary tract infections in the past she had had Proteus, Pseudomonas ( R to cipro, azreonam,Gent; S to Zosyn, ceftaz,cefepime) , enterococcus which was pansensative  patient has chronic hypotension with blood pressures normally 90s over 60s. She has chronic history of decubitus ulcer and required due to debridement in 2015 and has been on long-term antibiotics including doxycycline.  She status post pacemaker last time it was interrogated in 2016 she is non-pacer dependent    IN ER:  Temp (24hrs), Avg:97.6 F (36.4 C), Min:96.9 F (36.1 C), Max:98.2 F (36.8 C)  RR 10  1005 on # L  BP 94/56 Lactic acid 2.77 Cr 0.71 Alb 1.9  WBC 30.2 Hgb 11.4  Chest x-ray showed persistent changes in the bases bilaterally right slightly greater than left Following Medications were ordered in ER: Medications  0.9 %  sodium chloride infusion (1,000 mLs Intravenous New Bag/Given 10/31/15 1643)  levofloxacin (LEVAQUIN) IVPB 750 mg (750 mg Intravenous New Bag/Given 10/31/15 1843)  vancomycin (VANCOCIN) IVPB 1000 mg/200 mL premix (not administered)  levofloxacin (LEVAQUIN) IVPB 750 mg (not administered)  aztreonam (AZACTAM) 1 g in dextrose 5 % 50 mL IVPB (not administered)  naloxone (NARCAN) injection 0.4 mg (0.4 mg Nasal Given 10/31/15 1457)  sodium chloride 0.9 % bolus 1,000 mL (0 mLs Intravenous Stopped  10/31/15 1641)  naloxone (NARCAN) injection 0.4 mg (0.4 mg Intravenous Given 10/31/15 1625)  aztreonam (AZACTAM) 2 g in dextrose 5 % 50 mL IVPB (0 g Intravenous Stopped 10/31/15 1839)  vancomycin (VANCOCIN) IVPB 1000 mg/200 mL premix (1,000 mg Intravenous New Bag/Given 10/31/15 1753)    Hospitalist was called for admission for sepsis  After being admitted to hospitalist service, She developed progressive hypotension and respiratory failure from HCAP and a UTI leading to septic shock.required intubation and icu transfer. Completed antibiotics course. Patient failed extubation, reintubated and trached on 10/23.  CCM transferred to [facility] on 11/17/2015, on 17th days of inpatient hospitalization.  S/p trach, peg, indwelling foley  Hospital Course:  Active Problems:   Overdose of opiate or related narcotic   Protein-calorie malnutrition, severe (HCC)   Quadriplegia, C5-C7 incomplete (HCC)   Severe sepsis (HCC)   Leukocytosis   Decubitus ulcer of sacral region, stage 4 (HCC)   Sepsis ([place])   Lower urinary tract infectious disease   Acute encephalopathy   Tobacco abuse   Asthma   Malnutrition of moderate degree   Encounter for intubation   Acute respiratory failure with hypoxemia (HCC)   Shortness of breath   Esophageal dysphagia   Palliative care encounter   Goals of care, counseling/discussion   Acute hypoxic/hypercapnic respiratory failure secondary to aspiration pna/MDRO ACINETOBACTER UTI/septic shock/metabolic encephalopathy Patient was admitted on 10/11 with altered mental status , sepsis from uit/pna, the next day she was put on pressor and intubated due to worsening of mental status and developed septic  shock required pressors  She failed extubation and had to be reintubated on 10/17night, s/p trach on 10/23,   Improved. Patient completed antibiotics course as per medical record  Pulm recommendation: would not downsize trach, OK to change to #6 cuffless prior to discharge, would  not consider decannulation until she has several months of stability without respiratory failure, f/u with trach clinic -continue supportive care. Suctions and trach care Increase secretion noticed on 11/3, repeat cxr with decreased left pleural effusion, but persistent right effusion, chest vest continue nutrition support, not able to use lasix due to borderline bp, Improving, stable    #ileus: patient denies abdominal pain, no n/v, tolerating tube feeds. Report + flatus, no bm   keep k>4, mag>2,  kub daily showed  ileus has improved, s/p Peg placement by interventional radiology on 11/3 gtube on suction briefly, s/p enema with good result, repeat kub ileus resolved ,  tube feeds started on 11/4 pm  # Protein-calorie malnutrition, severe ([place]): Speech and swallow evaluation note reviewed.  aspiration risk appeared to be both pharyngeal and esophageal. As respiratory suctioned out jello on 11/3, now npo except ice chips  s/p PEG tube placement on 11/3 Speech and swallow team, dietary consult appreciated.  Discharge tube feeds instruction per dietician: Osmolite 1.2 @ 90 mL/hr over 16 hours/day with 30 mL Prostat BID and 100 mL free water QID. This regimen will provide 1928 kcal, 110 grams of protein, and 1581 mL free water.   Home health to continue follow tube feeds, may consider bolus feeds if patient able to tolerate,  Free water decreased to 100cc q8hrs due to presence of pleural effusion.  Chronic Decubitus ulcer of sacral region, stage 4 (HCC)/chronic coccyx osteomyelitis. Wound care, has indwelling foley.     Quadriplegia, C5-C7 incomplete ([place]): bedridden, indwelling catheter (being changed qmonth, last changed on 10/30) Continue baclofen and supportive care  H/o SSS s/p pacemaker in 2015, chronic asymptomatic hypotension( autonomic instability). Her home midodrine was continued.     Chronic pain: on baclofen, neurontin, prn oxycodone, patient asked to increase  oxycodone to 2tabs every 4hrs, I have explained to her with her poor respiratory status and borderline blood pressure, it is not safe to increase oxycodone, in fact , she looks comfortable on exam . Topical lidocaine patch provided at discharge    Severe sepsis ([place])     Lower urinary tract infectious disease   Acute encephalopathy   History of asthma and tobacco abuse   Ileus: Improved   Hyperglycemia due to steroid   DVT prophylaxis while in the hospital: Lovenox subcutaneous Code Status: Full code Family Communication: No family present at bedside Disposition Plan:  discharge  home with [facility] on Monday 11/7, planned discharged on 11/6 cancelled due to home health insurance approval issues Patient is declining SNF at this time.  Consultants:   Pulmonary critical care.  Interventional radiologist  Palliative care  Procedures: Lt femoral aline 10/13 >> 10/14 ETT 10/13 >>10/17, 10/18 >> Lt IJ CVL 10/13 >>10/26 Trach 10/23 Peg 11/3  SIGNIFICANT EVENTS: 10/11 Admit 10/13 VDRF, pressors, bronch 10/15 Vasopressors off  10/16 Fever, weaning on PSV 10/5 10/17 Extubated, failed and reintubated overnight 10/20 Weaning on PSV, plan for trach 10/23 10/23 Tracheostomy  Antimicrobials: Vancomycin 10/11 >> 10/15 Levaquin 10/11 >> 10/12 Aztreonam 10/11 >>10/12  Diflucan 10/12 >> 10/14 Primaxin 10/12 >> 10/22  STUDIES: CT chest 10/12 >> occlusion of Rt bronchus intermedius, basilar consolidation, small Rt effusion Bronchoscopy 10/13 >> significant purulent  secretions airways CT Pelvis 10/16 >> no evidence of emphysematous fasciitis along the perineum, stage IV decubitus ulcers along the sacrum / ischial tuberosities associated with chronic osteomyelitis CT abdomen 10/24 > ileus, anasarca  CULTURES: Blood 10/11 >> neg Urine 10/11 >> Acinetobacter, yeast Sputum 10/13 >> neg    Discharge Exam: BP (!) 134/103   Pulse 90   Temp 98.3 F (36.8  C) (Oral)   Resp 20   Ht 5\' 2"  (1.575 m)   Wt 43.1 kg (95 lb 0.3 oz)   SpO2 94%   BMI 17.38 kg/m    General exam:Cachectic female, not in distress, s/p trach, s/p peg, indwelling foley Neck: Trachea stoma site clean with no bleeding. Respiratory system: diminished at basis, no wheeze Cardiovascular system: Regular rate rhythm, S1 and S2 normal. No pedal edema, s/p pacemaker Gastrointestinal system: Bowel sound positive, abdomen soft, nontender and not distended, s/p peg placement Central nervous system: Alert, awake, following commands and oriented. Extremities: paraplegia Skin: chronic sacral decubitus ulcer Psychiatry: Judgement and insight appear normal. Mood & affect appropriate.    Discharge Instructions You were cared for by a hospitalist during your hospital stay. If you have any questions about your discharge medications or the care you received while you were in the hospital after you are discharged, you can call the unit and asked to speak with the hospitalist on call if the hospitalist that took care of you is not available. Once you are discharged, your primary care physician will handle any further medical issues. Please note that NO REFILLS for any discharge medications will be authorized once you are discharged, as it is imperative that you return to your primary care physician (or establish a relationship with a primary care physician if you do not have one) for your aftercare needs so that they can reassess your need for medications and monitor your lab values.  Discharge Instructions    Discharge instructions    Complete by:  As directed    Can have thin liquid/ ice chips by mouth, continue swallow eval periodically, aspiration precaution Nutrition and meds per tube  Tube feeds: Osmolite 1.2 @ 90 mL/hr over 16 hours/day with 30 mL Prostat BID and 100 mL free water every 8hrs.   Face-to-face encounter (required for Medicare/Medicaid patients)    Complete by:  As  directed    I Cleo Santucci certify that this patient is under my care and that I, or a nurse practitioner or physician's assistant working with me, had a face-to-face encounter that meets the physician face-to-face encounter requirements with this patient on 11/26/2015. The encounter with the patient was in whole, or in part for the following medical condition(s) which is the primary reason for home health care (List medical condition): FTT RN for wound care to sacral wound, tube feeds, foley care Respiratory therapy for trach care   The encounter with the patient was in whole, or in part, for the following medical condition, which is the primary reason for home health care:  FTT   I certify that, based on my findings, the following services are medically necessary home health services:   Nursing Speech language pathology     Reason for Medically Necessary Home Health Services:  Skilled Nursing- Change/Decline in Patient Status   My clinical findings support the need for the above services:  Bedbound   Further, I certify that my clinical findings support that this patient is homebound due to:  Immunocompromised   For home use only DME Cough  assist device    Complete by:  As directed    yaukauer suction device   For home use only DME oxygen    Complete by:  As directed    Mode or (Route):  Mask   Liters per Minute:  5   Frequency:  Continuous (stationary and portable oxygen unit needed)   Oxygen delivery system:  Gas   Home Health    Complete by:  As directed    To provide the following care/treatments:   [place] work     Increase activity slowly    Complete by:  As directed        Medication List    STOP taking these medications   cholecalciferol 1000 units tablet Commonly known as:  VITAMIN D   ciprofloxacin 500 MG tablet Commonly known as:  CIPRO   dextromethorphan-guaiFENesin 30-600 MG 12hr tablet Commonly known as:  MUCINEX DM    doxycycline 100 MG tablet Commonly known as:  VIBRA-TABS   DULoxetine 30 MG capsule Commonly known as:  CYMBALTA   ferrous sulfate 325 (65 FE) MG tablet   gabapentin 400 MG capsule Commonly known as:  NEURONTIN Replaced by:  gabapentin 250 MG/5ML solution   hydrocortisone 10 MG tablet Commonly known as:  CORTEF   levofloxacin 500 MG tablet Commonly known as:  LEVAQUIN   oxycodone 5 MG capsule Commonly known as:  OXY-IR Replaced by:  oxyCODONE 5 MG immediate release tablet   tiZANidine 4 MG tablet Commonly known as:  ZANAFLEX     TAKE these medications   acetaminophen 500 MG tablet Commonly known as:  TYLENOL Place 1 tablet (500 mg total) into feeding tube every 6 (six) hours as needed for moderate pain. What changed:  how much to take  how to take this   albuterol (2.5 MG/3ML) 0.083% nebulizer solution Commonly known as:  PROVENTIL Take 2.5 mg by nebulization every 6 (six) hours as needed for wheezing or shortness of breath.   albuterol 108 (90 Base) MCG/ACT inhaler Commonly known as:  PROVENTIL HFA;VENTOLIN HFA Inhale 2 puffs into the lungs every 6 (six) hours as needed. For shortness of breath   baclofen 10 MG tablet Commonly known as:  LIORESAL Place 1 tablet (10 mg total) into feeding tube 3 (three) times daily. What changed:  medication strength  how much to take  how to take this   bisacodyl 10 MG suppository Commonly known as:  DULCOLAX Place 1 suppository (10 mg total) rectally daily as needed for moderate constipation.   clonazePAM 0.5 MG disintegrating tablet Commonly known as:  KLONOPIN Place 1 tablet (0.5 mg total) into feeding tube at bedtime.   collagenase ointment Commonly known as:  SANTYL Apply topically daily.   [employer] VITAMIN B12 1000 MCG tablet Generic drug:  cyanocobalamin Place 1 tablet (1,000 mcg total) into feeding tube daily. What changed:  how much to take  how to take this   feeding supplement (OSMOLITE 1.5 CAL)  Liqd Place 1,000 mLs into feeding tube daily. What changed:  how much to take  how to take this  when to take this   feeding supplement (PRO-STAT SUGAR FREE 64) Liqd Place 30 mLs into feeding tube 2 (two) times daily.   free water Soln Place 100 mLs into feeding tube every 8 (eight) hours.   free water Soln Place 100 mLs into feeding tube every 8 (eight) hours.   gabapentin 250 MG/5ML solution Commonly known as:  NEURONTIN  Place 6 mLs (300 mg total) into feeding tube 3 (three) times daily. Replaces:  gabapentin 400 MG capsule   ibuprofen 200 MG tablet Commonly known as:  ADVIL,MOTRIN Place 2 tablets (400 mg total) into feeding tube every 6 (six) hours as needed for moderate pain. What changed:  how to take this   lidocaine 5 % Commonly known as:  LIDODERM Place 1 patch onto the skin 2 (two) times daily as needed (pain).   Melatonin 3 MG Tabs 2 tablets (6 mg total) by PEG Tube route at bedtime as needed (for sleep). What changed:  how to take this   midodrine 10 MG tablet Commonly known as:  PROAMATINE Place 1 tablet (10 mg total) into feeding tube 3 (three) times daily with meals. What changed:  medication strength  how much to take  how to take this  Another medication with the same name was removed. Continue taking this medication, and follow the directions you see here.   neomycin-bacitracin-polymyxin ointment Commonly known as:  NEOSPORIN Apply 1 application topically daily. apply to eye   oxyCODONE 5 MG immediate release tablet Commonly known as:  Oxy IR/ROXICODONE Place 1 tablet (5 mg total) into feeding tube every 6 (six) hours as needed for moderate pain or severe pain. Replaces:  oxycodone 5 MG capsule   polyethylene glycol packet Commonly known as:  MIRALAX / GLYCOLAX Place 17 g into feeding tube daily.   ranitidine 150 MG/10ML syrup Commonly known as:  ZANTAC Place 10 mLs (150 mg total) into feeding tube every 12 (twelve) hours.    sennosides 8.8 MG/5ML syrup Commonly known as:  SENOKOT Place 5 mLs into feeding tube at bedtime as needed for mild constipation or moderate constipation.   silver sulfADIAZINE 1 % cream Commonly known as:  SILVADENE Apply 1 application topically every other day as needed (for wound care).   venlafaxine 75 MG tablet Commonly known as:  EFFEXOR Place 1 tablet (75 mg total) into feeding tube 2 (two) times daily.   zinc oxide 11.3 % Crea cream Commonly known as:  BALMEX Apply 1 application topically 3 (three) times daily as needed (for skin redness/irritation).            Durable Medical Equipment        Start     Ordered   11/26/15 1246  For home use only DME Tube feeding pump  Once    Comments:  Osmolite 1.2 @ 90 mL/hr over 16 hours/day with 30 mL Promod BID and 100 mL free water QID. This regimen will provide 1928 kcal, 110 grams of protein, and 1581 mL free water.      11/26/15 1245   11/26/15 1159  For home use only DME Trach supplies  Once     11/26/15 1200   11/26/15 1159  For home use only DME Trach supplies  Once    Comments:  Portable suction machine/ #14 french cath's /trache care kits  Question Answer Comment  Trach Type [place]   Size 6 cfs   Cuffed or Uncuffed Uncuffed   Fenestrated No   Does patient need speaking valve? Yes   Trach Humidification Yes   If Oxygen Bleed In (lpm) 5   Suction Needed? Yes   Manual Resuscitator Needed? Yes   Emergency Backup Trach size (one size smaller than trach in throat) 4      11/26/15 1200   11/26/15 1155  For home use only DME Tube feeding pump  Once  11/26/15 1155   11/26/15 1044  For home use only DME Trach supplies  Once     11/26/15 1044   11/26/15 0000  For home use only DME oxygen    Question Answer Comment  Mode or (Route) Mask   Liters per Minute 5   Frequency Continuous (stationary and portable oxygen unit needed)   Oxygen delivery system Gas      11/26/15 0956   11/26/15 0000  For home use only  DME Cough assist device    Comments:  yaukauer suction device   11/26/15 1009     Allergies  Allergen Reactions  . Erythromycin Nausea And Vomiting  . Nitrofurantoin Monohyd Macro Nausea And Vomiting  . Penicillins Hives    Has patient had a PCN reaction causing immediate rash, facial/tongue/throat swelling, SOB or lightheadedness with hypotension: Yes Has patient had a PCN reaction causing severe rash involving mucus membranes or skin necrosis: Yes Has patient had a PCN reaction that required hospitalization Yes- went to the DR Has patient had a PCN reaction occurring within the last 10 years: No-childhood allergy If all of the above answers are "NO", then may proceed with Cephalosporin use.    Follow-up Information    f/u with pcp at [place] Follow up in 1 week(s).   Why:  for hospital discharge follow up       Pulmonary Physician, MD Follow up.   Specialty:  Pulmonary Disease           The results of significant diagnostics from this hospitalization (including imaging, microbiology, ancillary and laboratory) are listed below for reference.    Significant Diagnostic Studies: Ct Abdomen Pelvis Wo Contrast  Result Date: 11/13/2015 CLINICAL DATA:  Abdominal distention and tenderness. History of ileus and appendectomy, quadriparesis. Assess anatomy for gastrostomy tube placement. EXAM: CT ABDOMEN AND PELVIS WITHOUT CONTRAST TECHNIQUE: Multidetector CT imaging of the abdomen and pelvis was performed following the standard protocol without IV contrast. COMPARISON:  CT pelvis November 05, 2015 FINDINGS: LOWER CHEST: Dense consolidation in the included lower lobes bilaterally. Bilateral pleural effusions. Heart size is normal. No pericardial effusion. Cardiac pacer wires in place. HEPATOBILIARY: Normal. PANCREAS: Normal. SPLEEN: Normal. ADRENALS/URINARY TRACT: Kidneys are orthotopic, demonstrating normal size and morphology. 6 mm calculus in RIGHT renal pelvis, 6 mm  calculus in proximal RIGHT ureter. 3 mm and 2 mm LEFT lower pole nephrolithiasis. No hydronephrosis. Limited assessment for renal masses on this nonenhanced examination. The unopacified ureters are normal in course and caliber. Urinary bladder is partially distended containing a Foley catheter in bulb. Multiple small dependent bladder calculi. Normal adrenal glands. STOMACH/BOWEL: Mild gas and contrast distended colon to 5.7 cm. Decompressed small bowel. Gastrostomy tube terminates in mid stomach. Tenting of the greater car bridge the stomach toward the abdominal wall most compatible with old gastrostomy tube. VASCULAR/LYMPHATIC: Aortoiliac vessels are normal in course and caliber, severe calcific atherosclerosis. REPRODUCTIVE: Normal. OTHER: Moderate amount of low-density ascites. No intraperitoneal free air. MUSCULOSKELETAL: Anasarca. LEFT anterior abdominal wall scarring. Bilateral sacral in ischial tuberosity ulcers with packing material. Severe osteopenia. Sclerotic cough 6 with bony reabsorption. IMPRESSION: Gas and contrast distended large bowel compatible with ileus. No bowel obstruction. Tenting of the stomach to the anterior abdominal wall at site of prior gastrostomy. Worsening anasarca, increasing moderate ascites. Severe atherosclerosis. 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 Electronically Signed   By: Elon Alas M.D.   On: 11/13/2015 00:32   Dg Chest 1 View  Result Date:  11/23/2015 CLINICAL DATA:  Increased tracheal secretions. EXAM: CHEST 1 VIEW COMPARISON:  11/14/2015 FINDINGS: The tracheostomy tube tip is above the carina. The feeding tube tip is below the field of view within the upper abdomen. There is a left chest wall pacer device with leads in the right atrial appendage and right ventricle. Normal heart size. There has been interval decrease in volume of left effusion. Persistent right pleural effusion. IMPRESSION: 1. Decrease in volume of left  pleural effusion. 2. Persistent right effusion. Electronically Signed   By: Kerby Moors M.D.   On: 11/23/2015 09:53   Dg Abd 1 View  Result Date: 11/24/2015 CLINICAL DATA:  Ileus.  Abdominal distention. EXAM: ABDOMEN - 1 VIEW COMPARISON:  11/23/2015 FINDINGS: Percutaneous gastrostomy tube is now seen in place. Feeding tube remains in place with tip in the body the stomach. Residual oral contrast is seen within the colon. There is no evidence of dilated bowel loops or bowel obstruction. IMPRESSION: Percutaneous gastrostomy tube and feeding tube both seen within the body the stomach. Nonobstructive bowel gas pattern, with residual oral contrast seen and colon. Electronically Signed   By: Earle Gell M.D.   On: 11/24/2015 15:58   Dg Abd 1 View  Result Date: 11/23/2015 CLINICAL DATA:  Ileus. EXAM: ABDOMEN - 1 VIEW COMPARISON:  11/22/2015.  11/21/2015. FINDINGS: Dobhoff tube noted with tip projected over stomach. Soft tissue structures are unremarkable. Oral contrast noted throughout the colon. Again previously identified small bowel distention has improved. No free air. Pelvic calcifications consistent phleboliths. Surgical clips in the pelvis. IMPRESSION: 1.Dobhoff tube in stable position with tip in the stomach. No gastric distention. 2. Oral contrast in the colon. Again previously identified small bowel distention has improved . Electronically Signed   By: Marcello Moores  Register   On: 11/23/2015 07:10   Dg Abd 1 View  Result Date: 11/22/2015 CLINICAL DATA:  45 y/o  F; ileus. EXAM: ABDOMEN - 1 VIEW COMPARISON:  11/21/2015 abdomen radiograph FINDINGS: Enteric tube tip projects over mid stomach. Retained contrast within the colon. Persistent dilated loops of small bowel mildly improved. No acute osseous abnormality is evident. IMPRESSION: Persistently dilated loops of small bowel mildly improved and retained contrast within the colon. Electronically Signed   By: Kristine Garbe M.D.   On: 11/22/2015  05:57   Dg Abd 1 View  Result Date: 11/16/2015 CLINICAL DATA:  NG tube placement EXAM: ABDOMEN - 1 VIEW COMPARISON:  CT abdomen pelvis 0000000 FINDINGS: Metallic tip of the nasogastric tube overlies the pylorus. Contrast material instilled through the tube is seen at the gastroduodenal junction extending into the small bowel. IMPRESSION: Nasogastric tube tip overlying the pylorus. Electronically Signed   By: Ulyses Jarred M.D.   On: 11/16/2015 17:46   Dg Abd 1 View  Result Date: 11/12/2015 CLINICAL DATA:  Nasogastric tube placement. EXAM: ABDOMEN - 1 VIEW COMPARISON:  11/05/2015 radiographs FINDINGS: The tip of a gastric tube included side-port are seen below the level the left hemidiaphragm in the expected location of the stomach. Moderate gaseous distention of small and large bowel in a nonspecific bowel gas pattern is identified. There is no pneumoperitoneum. Right atrial and right ventricular pacing wires are noted of the visualized heart which is mildly enlarged. Small bilateral effusions. IMPRESSION: Gastric tube tip and side port in the expected location of stomach. Nonspecific bowel gas pattern with mild to moderate gaseous distention of large and small bowel. Electronically Signed   By: Ashley Royalty M.D.   On: 11/12/2015  23:51   Ct Chest W Contrast  Result Date: 11/01/2015 CLINICAL DATA:  Abnormal chest radiograph.  Leukocytosis. History of traumatic spinal injury with tear per leisure a. History of re- recurrent UTI, decubitus ulcer, malnutrition and recurrent sepsis. EXAM: CT CHEST WITH CONTRAST TECHNIQUE: Multidetector CT imaging of the chest was performed during intravenous contrast administration. CONTRAST:  47mL ISOVUE-300 IOPAMIDOL (ISOVUE-300) INJECTION 61% COMPARISON:  Chest CT - 09/16/2013; 10/20/2009 FINDINGS: Cardiovascular: Normal heart size. Left anterior chest wall pacemaker with tips terminating within the right atrium ventricle. Coronary artery calcifications. No  pericardial effusion. Although this examination was not tailored for the evaluation the pulmonary arteries, there are no discrete filling defects within the central pulmonary arterial tree to suggest central pulmonary embolism. Normal caliber of the main pulmonary artery. Mediastinum/Nodes: Mediastinal adenopathy with index left-sided paratracheal lymph node measuring 1.4 cm in greatest short axis diameter (image 46, series 2 and index precarinal lymph node measuring 1 cm (image 50, series 2). No axillary lymphadenopathy. Lungs/Pleura: There is near complete occlusion of the right bronchus intermedius secondary to presumed aspirated debris (axial image 56, series 7; coronal image 51, series 602) with near complete atelectasis / collapse of the right middle and lower lobes. The right upper lobe remains fairly well aerated. The left bronchi appear patent, however there are rather extensive consolidative airspace opacities throughout the left lung, worse within the left lower lobe with associated air bronchograms. Small, potentially loculated right-sided pleural effusion and trace left-sided pleural effusion. No pneumothorax. Upper Abdomen: Note is made of a small amounts of perihepatic ascites (image 33, series 2 Musculoskeletal: No acute or aggressive osseous abnormalities. IMPRESSION: 1. Occlusion of the right bronchus intermedius with associated consolidation and collapse of the right middle and lower lobes, worrisome for aspiration/aspiration pneumonia. Further evaluation with bronchoscopy could be performed as clinically indicated. 2. Extensive airspace opacities within the left lung, worse within the left lower lobe, also worrisome for areas of aspiration and/or multi focal infection. 3. Small right and trace left-sided pleural effusions - note, the right-sided pleural effusion may be at least partially loculated. 4. Mediastinal adenopathy, nonspecific though presumably reactive in the setting of multi focal  infection. 5. Small amount of perihepatic ascites, incompletely evaluated. Electronically Signed   By: Sandi Mariscal M.D.   On: 11/01/2015 16:43   Ct Pelvis Wo Contrast  Result Date: 11/05/2015 CLINICAL DATA:  Quadriplegia, decubitus ulcers, assessment for possible perineal fasciitis. EXAM: CT PELVIS WITHOUT CONTRAST TECHNIQUE: Multidetector CT imaging of the pelvis was performed following the standard protocol without intravenous contrast. COMPARISON:  Radiographs from 11/05/2015; pelvic CT from 12/31/2011 FINDINGS: Urinary Tract: Foley catheter in the urinary bladder. Tiny calcifications in the right-sided the urinary bladder compatible with small bladder calculi. Bowel: Mild prominence of stool in the colon. No dilated small bowel in the pelvis. Vascular/Lymphatic: Dense atherosclerotic calcification. Reproductive: Calcification along the anterior uterine margin probably from a fibroid. Other: Extensive subcutaneous edema along the lower abdomen and pelvis. Moderate pelvic ascites. Musculoskeletal: Stage IV decubitus ulcer along the posterior sacrum extending to the bony margin. Stage IV decubitus ulcers overlying both ischial tuberosities, extending to the bony margin. Sclerosis of the left ischial tuberosity extending into the posterior wall of the left acetabulum compatible with chronic osteomyelitis. I would be surprised if there is not also chronic osteomyelitis of the right ischium and of the sacrum. Bony spurring of the posterior wall of the right acetabulum. Patchy osteoporosis in the lower pelvis and hips, small left hip joint effusion. Abnormal  edema tracks into the mons pubis, labia, and upper thigh regions, an asymmetric fashion, an without gas infiltration to suggest emphysematous fasciitis. IMPRESSION: 1. There is no evidence of emphysematous fasciitis along the perineum. The appearance at earlier radiography was likely either due to the considerable subcutaneous stranding outlining lobules of  adipose tissue and simulating gas, or possibly fluid within a diaper or garment along the perineum. 2. There are stage IV decubitus ulcers along the sacrum and ischial tuberosities, associated with chronic osteomyelitis, most apparent in the left ischium. 3. Diffuse subcutaneous edema and third spacing of fluid including mesenteric edema and moderate pelvic ascites. 4. Small bladder calculi are present. 5.  Aortoiliac atherosclerotic vascular disease. Electronically Signed   By: Van Clines M.D.   On: 11/05/2015 14:36   Ir Gastrostomy Tube Mod Sed  Result Date: 11/23/2015 INDICATION: 45 year old female with a history of dysphagia. EXAM: PERC PLACEMENT GASTROSTOMY MEDICATIONS: Vancomycin 1 gm IV; Antibiotics were administered within 1 hour of the procedure. ANESTHESIA/SEDATION: Versed 1.0 mg IV; Fentanyl 50 mcg IV Moderate Sedation Time:  13 The patient was continuously monitored during the procedure by the interventional radiology nurse under my direct supervision. CONTRAST:  30mL ISOVUE-300 IOPAMIDOL (ISOVUE-300) INJECTION 61% - administered into the gastric lumen. FLUOROSCOPY TIME:  Fluoroscopy Time: 3 minutes 12 seconds (10 mGy). COMPLICATIONS: None PROCEDURE: Informed written consent was obtained from the patient's family after a thorough discussion of the procedural risks, benefits and alternatives. All questions were addressed. Maximal Sterile Barrier Technique was utilized including caps, mask, sterile gowns, sterile gloves, sterile drape, hand hygiene and skin antiseptic. A timeout was performed prior to the initiation of the procedure. The procedure, risks, benefits, and alternatives were explained to the patient. Questions regarding the procedure were encouraged and answered. The patient understands and consents to the procedure. The epigastrium was prepped with Betadine in a sterile fashion, and a sterile drape was applied covering the operative field. A sterile gown and sterile gloves were  used for the procedure. A 5-French orogastric tube is placed under fluoroscopic guidance. Scout imaging of the abdomen confirms barium within the transverse colon. The stomach was distended with gas. Under fluoroscopic guidance, an 18 gauge needle was utilized to puncture the anterior wall of the body of the stomach. An Amplatz wire was advanced through the needle passing a T fastener into the lumen of the stomach. The T fastener was secured for gastropexy. A 9-French sheath was inserted. A snare was advanced through the 9-French sheath. A Britta Mccreedy was advanced through the orogastric tube. It was snared then pulled out the oral cavity, pulling the snare, as well. The leading edge of the gastrostomy was attached to the snare. It was then pulled down the esophagus and out the percutaneous site. It was secured in place. Contrast was injected. No complication IMPRESSION: Status post fluoroscopic placed percutaneous gastrostomy tube, with 20 [place] pull-through. Signed, Dulcy Fanny. Earleen Newport, DO [facility] Electronically Signed   By: Corrie Mckusick D.O.   On: 11/23/2015 16:24   Dg Chest Port 1 View  Result Date: 11/14/2015 CLINICAL DATA:  45 year old female with pneumonia. EXAM: PORTABLE CHEST 1 VIEW COMPARISON:  Chest radiograph dated 11/13/2015 FINDINGS: Tracheostomy with tip above the carina in stable positioning. Enteric tube courses into the left hemi abdomen with tip side-port below the diaphragm and tip beyond the inferior margin of the image. No significant interval change in the small bilateral pleural effusions with bibasilar hazy airspace densities. There is no  pneumothorax. Stable cardiac silhouette. Left pectoral pacemaker device. Partially visualized cervical fusion hardware. No acute fracture. IMPRESSION: No significant interval change in the small bilateral pleural effusions and lower lobe airspace densities. Support line and tube in stable  positioning. Electronically Signed   By: Anner Crete M.D.   On: 11/14/2015 06:34   Dg Chest Port 1 View  Result Date: 11/13/2015 CLINICAL DATA:  Respiratory failure. EXAM: PORTABLE CHEST 1 VIEW COMPARISON:  11/12/2015. FINDINGS: Tracheostomy tube and left IJ line in stable position. Cardiac pacer stable position. Heart size stable. Low lung volumes. Persistent bilateral lower lobe airspace disease . Persistent bilateral pleural effusions. Prior cervical spine fusion. IMPRESSION: 1. Interim placement of NG tube, its tip is below left hemidiaphragm. Tracheostomy tube and left IJ line stable position. 2. Persistent low lung volumes. Persistent unchanged bilateral lower lobe airspace disease and small pleural effusions. Electronically Signed   By: Marcello Moores  Register   On: 11/13/2015 07:31   Dg Chest Port 1 View  Result Date: 11/12/2015 CLINICAL DATA:  Pneumonia EXAM: PORTABLE CHEST 1 VIEW COMPARISON:  11/09/2015 FINDINGS: Left pacer remains in place, unchanged. Interval placement tracheostomy tube with the tip projecting over the mid trachea. Left central line tip in the SVC. Bilateral lower lobe airspace opacities with layering effusions. Aeration somewhat worsened since prior study. IMPRESSION: Worsening bilateral lower lobe airspace opacities and small layering effusions. Electronically Signed   By: Rolm Baptise M.D.   On: 11/12/2015 12:53   Dg Chest Port 1 View  Result Date: 11/09/2015 CLINICAL DATA:  Endotracheal tube placement. EXAM: PORTABLE CHEST 1 VIEW COMPARISON:  11/09/2015 FINDINGS: Endotracheal tube is 3.6 cm above the carina and appropriately positioned. Nasogastric tube extends into the abdomen. Stable position of the left dual-chamber cardiac pacemaker. Again noted are patchy airspace densities in the lower lungs. Evidence for right pleural fluid. Negative for a pneumothorax. There is a left jugular central venous catheter and the tip is near the junction of the left innominate vein and  SVC. IMPRESSION: Endotracheal tube is appropriately positioned. Support apparatuses as described. Persistent airspace densities in the lower lungs. Findings could represent edema versus pneumonia. Minimal change from the previous examination. Right pleural effusion. Electronically Signed   By: Markus Daft M.D.   On: 11/09/2015 10:57   Dg Chest Port 1 View  Result Date: 11/09/2015 CLINICAL DATA:  Respiratory failure. EXAM: PORTABLE CHEST 1 VIEW COMPARISON:  11/08/2015. FINDINGS: Endotracheal tube and NG tube in stable position. Cardiac pacer noted with leads in the right atrium right ventricle. Heart size stable. Persistent bilateral airspace disease, left side greater than right. Persistent scratched right pleural effusion. Prior cervical spine fusion. IMPRESSION: 1. Lines and tubes in stable position. 2.  Cardiac pacer stable position.  Heart size stable. 3. Persistent bilateral airspace disease in right pleural effusion. No significant change. Electronically Signed   By: Marcello Moores  Register   On: 11/09/2015 07:59   Dg Chest Port 1 View  Result Date: 11/08/2015 CLINICAL DATA:  Respiratory failure. EXAM: PORTABLE CHEST 1 VIEW COMPARISON:  11/07/2015. FINDINGS: Endotracheal tube and NG tube in stable position. Cardiac pacer stable position. Heart size stable. Mild bilateral scratch chest diffuse bilateral airspace disease, no change. Small right pleural effusion. Small bilateral pleural effusions. No change. No pneumothorax. IMPRESSION: 1. Lines and tubes in stable position. 2. Stable diffuse bilateral airspace disease small bilateral pleural effusions. No change from prior exam P Electronically Signed   By: Marcello Moores  Register   On: 11/08/2015 06:50   Dg  Chest Port 1 View  Result Date: 11/07/2015 CLINICAL DATA:  45 year old female status post intubation. EXAM: PORTABLE CHEST 1 VIEW COMPARISON:  Chest radiograph dated 11/05/2015 FINDINGS: Endotracheal tube the tip approximately 4.3 cm above the carina.  Enteric tube courses into the left upper abdomen with tip beyond the inferior margin of the image. Bilateral mid to lower lung field airspace opacities, left greater right again noted. There small bilateral pleural effusions. No pneumothorax. Stable cardiac silhouette with left pectoral pacemaker device. Cervical spine fusion screws. No acute fracture. IMPRESSION: Endotracheal tube above the carina. Bilateral interstitial and airspace densities, left greater right with no significant interval change since the prior radiograph. Small bilateral pleural effusions, right greater left. Electronically Signed   By: Anner Crete M.D.   On: 11/07/2015 01:19   Dg Chest Port 1 View  Result Date: 11/05/2015 CLINICAL DATA:  Hypoxia EXAM: PORTABLE CHEST 1 VIEW COMPARISON:  November 04, 2015. FINDINGS: Endotracheal tube tip is 3.5 cm above the carina. Nasogastric tube tip and side port are below the diaphragm. Pacemaker leads are attached to the right atrium and right ventricle. No evident pneumothorax. There is a right pleural effusion which appears partially loculated, stable. There is interstitial and patchy alveolar edema in the mid and lower lung zones bilaterally. Heart is borderline prominent with pulmonary vascularity within normal limits. There is postoperative change in the cervical spine. IMPRESSION: Tube and pacemaker lead positions as described without pneumothorax. Right pleural effusion with areas of interstitial and patchy alveolar edema. Suspect a degree of congestive heart failure. A degree of superimposed pneumonia cannot be excluded radiographically. The appearance of the lungs and cardiac silhouette is stable compared to 1 day prior. Electronically Signed   By: Lowella Grip III M.D.   On: 11/05/2015 07:18   Dg Chest Port 1 View  Result Date: 11/04/2015 CLINICAL DATA:  Acute respiratory failure. EXAM: PORTABLE CHEST 1 VIEW COMPARISON:  November 03, 2015 FINDINGS: Stable support apparatus.  Pacemaker. No pneumothorax. Stable right pleural effusion. Bilateral pulmonary opacities have mildly worsened in the interval. No other interval changes or acute abnormalities. Mildly distended stomach the despite the NG tube. IMPRESSION: 1. Mildly worsened bilateral pulmonary opacities, possibly edema. Stable right effusion. 2. Stable support apparatus. 3. Mildly distended stomach despite the NG tube. Electronically Signed   By: Dorise Bullion III M.D   On: 11/04/2015 07:10   Dg Chest Port 1 View  Result Date: 11/03/2015 CLINICAL DATA:  Healthcare associated pneumonia. EXAM: PORTABLE CHEST 1 VIEW COMPARISON:  Multiple recent previous exams. FINDINGS: 0504 hours. Endotracheal tube tip is 3.4 cm above the base of the carina. The NG tube passes into the stomach although the distal tip position is not included on the film. Left IJ central line tip overlies the upper SVC. Left permanent pacemaker again noted. Patient is rotated to the right. Bibasilar atelectasis/infiltrate again noted, slightly progressed on the left. Small right pleural effusion stable. Prominent gastric bubble noted despite the presence of an NG tube. IMPRESSION: 1. Stable cardiopulmonary exam. 2. Prominent gastric bubble despite the presence of an NG tube. Electronically Signed   By: Misty Stanley M.D.   On: 11/03/2015 08:01   Dg Chest Port 1 View  Result Date: 11/02/2015 CLINICAL DATA:  Endotracheal tube repositioning.  Initial encounter. EXAM: PORTABLE CHEST 1 VIEW COMPARISON:  Chest radiograph performed earlier today at 12:27 a.m. FINDINGS: The patient's endotracheal tube is seen ending 4 cm above the carina. An enteric tube is noted extending below the diaphragm.  A small right pleural effusion is noted. Vascular congestion is noted, with increased interstitial markings, concerning for mild pulmonary edema. No pneumothorax is seen. The cardiomediastinal silhouette is normal in size. A pacemaker is noted overlying the left chest wall,  with leads ending overlying the right atrium and right ventricle. No acute osseous abnormalities are identified. Cervical spinal fusion hardware is partially imaged. IMPRESSION: 1. Endotracheal tube noted ending 4 cm above the carina. 2. Small right pleural effusion. Vascular congestion, with increased interstitial markings, concerning for mild pulmonary edema. Electronically Signed   By: Garald Balding M.D.   On: 11/02/2015 01:08   Dg Chest Port 1 View  Result Date: 11/02/2015 CLINICAL DATA:  Post intubation. EXAM: PORTABLE CHEST 1 VIEW COMPARISON:  CT yesterday at 1625 hour FINDINGS: Endotracheal tube is 5.6 cm from the carina. Enteric tube in place, tip and side-port below the diaphragm in the stomach. Worsening volume loss in the right lung with increasing basilar opacity and pleural fluid. New patchy opacities throughout the left lung. Unchanged heart size and mediastinal contours. No pneumothorax. Surgical hardware in the cervical spine is partially included. IMPRESSION: 1. Endotracheal tube 5.6 cm from the carina. Tip and side port of the enteric tube below the diaphragm. 2. Worsening lung aeration with increasing right pleural effusion and right lung volume loss. Worsening of patchy opacities throughout the left lung, suspect worsening pneumonia or atelectasis, less likely pulmonary edema. Electronically Signed   By: Jeb Levering M.D.   On: 11/02/2015 01:04   Dg Chest Port 1 View  Result Date: 10/31/2015 CLINICAL DATA:  Respiratory distress EXAM: PORTABLE CHEST 1 VIEW COMPARISON:  09/24/2015 FINDINGS: Cardiac shadow is stable. The lungs are well aerated bilaterally and again demonstrate some bibasilar infiltrative changes. They have improved somewhat in the interval from the prior exam. Small right pleural effusion remains. No new focal abnormality is seen. IMPRESSION: Persistent changes in the bases bilaterally right slightly greater than left although some improvement is noted. A right  effusion remains. Electronically Signed   By: Inez Catalina M.D.   On: 10/31/2015 15:21   Dg Abd Portable 1v  Result Date: 11/21/2015 CLINICAL DATA:  All follow-up ileus. EXAM: PORTABLE ABDOMEN - 1 VIEW COMPARISON:  11/16/2015 FINDINGS: Feeding tube with the tip projecting over the stomach. There is gaseous distention of small bowel and colon. There is oral contrast material which has traversed the small bowel and is canal present within the colon. There is no evidence of pneumoperitoneum, portal venous gas or pneumatosis. There are no pathologic calcifications along the expected course of the ureters. The osseous structures are unremarkable. IMPRESSION: 1. Feeding tube with the tip projecting over the stomach. 2. Gaseous distention of small bowel colon without bowel obstruction. Contrast seen within the small bowel on the prior exam of 11/16/2015 is not present within the colon. Electronically Signed   By: Kathreen Devoid   On: 11/21/2015 10:25   Dg Abd Portable 1v  Addendum Date: 11/05/2015   ADDENDUM REPORT: 11/05/2015 12:49 ADDENDUM: The original report was by Dr. Van Clines. The following addendum is by Dr. Van Clines: These results were called by telephone at the time of interpretation on 11/05/2015 at 12:37 pm to Dr. Noe Gens , who verbally acknowledged these results. Electronically Signed   By: Van Clines M.D.   On: 11/05/2015 12:49   Result Date: 11/05/2015 CLINICAL DATA:  Constipation. Distended abdomen. Hx asthma, GERD, quadriparesis, appendectomy EXAM: PORTABLE ABDOMEN - 1 VIEW COMPARISON:  05/07/2015. FINDINGS: Nasogastric  tube noted, tip in the stomach antrum. Prominent stool throughout the colon favors constipation. No dilated small bowel identified. New blunting of the right lateral costophrenic angle. Interstitial accentuation at the lung bases the. Pacer leads project over the cardiac shadow. Bony demineralization in the lower pelvis and hips. Streaky linear  lucencies -possibly gas density- along the pelvic soft tissues of the perineum. IMPRESSION: 1. Questionable gas densities along the perineum, I cannot exclude fasciitis or decubitus ulcers involving the perineum and underlying the ischial tuberosities. Conceivably similar density could be caused by partially wet diaper or clothing. Fasciitis involving the pelvic floor can be fulminant than life-threatening, CT of the pelvis should be considered for further characterization. 2. Patchy lucencies in both hips and in the lower pelvis, some of this may represent gas density projecting over the hips, and some may be reactive. 3.  Prominent stool throughout the colon favors constipation. 4. Right pleural effusion with bibasilar indistinct densities. Radiology assistant personnel have been notified to put me in telephone contact with the referring physician or the referring physician's clinical representative in order to discuss these findings. Once this communication is established I will issue an addendum to this report for documentation purposes. Electronically Signed: By: Van Clines M.D. On: 11/05/2015 12:35   Dg Swallowing Func-speech Pathology  Result Date: 11/16/2015 Objective Swallowing Evaluation: Type of Study: MBS-Modified Barium Swallow Study Patient Details Name: Bree Rees MRN: [id number] Date of Birth: 29-Jun-1970 Today's Date: 11/16/2015 Time: SLP Start Time (ACUTE ONLY): 1505-SLP Stop Time (ACUTE ONLY): 1537 SLP Time Calculation (min) (ACUTE ONLY): 32 min Past Medical History: Past Medical History: Diagnosis Date . Asthma  . GERD (gastroesophageal reflux disease)  . Pacemaker  . Pleurisy  . Quadriparesis [facility])  Past Surgical History: Past Surgical History: Procedure Laterality Date . APPENDECTOMY   . CARDIAC SURGERY   . I&D EXTREMITY  10/28/2011  Procedure: IRRIGATION AND DEBRIDEMENT EXTREMITY;  Surgeon: Tennis Must, MD;  Location: [place];  Service: Orthopedics;   Laterality: Right;  Irrigation and debridement right middle finger HPI: 45 yo female adm to [facility] with leukocytosis and AMS 10/31/15.  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  Pt with h/o C2-C7 fusion and C3-C6 osteophytic changes per imaging study.  Pt resides with her caregiver mom per RN.  Hospital coarse complicated by pt requiring intubation for respiratory compromise, found to have occlusion of right bronchus with collapse of RM and RL lungs concerning for aspiration, left lung findings also concerning for aspiration.  Pt was placed on vent 10/12-10/17 - reintubated evening of 10/17 and trach placed 10/23.  Pt has been off the vent today full day and tolerating well.  Recent hospitalization for pna noted; pt coded and required defibrillation.  She also has h/o vent use for 3 months and trach *removed previously.  PMSV and swallow eval ordered.  Subjective: pt alert in chair, feeding tube removed for testing Assessment / Plan / Recommendation CHL IP CLINICAL IMPRESSIONS 11/16/2015 Therapy Diagnosis Suspected primary esophageal dysphagia;Mild pharyngeal phase dysphagia Clinical Impression Pt with only mild pharyngeal dysphagia without aspiration or deep penetration of any consistency tested.  Trace penetration of thin cleared within the same swallow.  She did NOT tolerate PMSV on for MBS due to quick complaint of difficulty breathing and overt breath stacking within 30 seconds.  Oral swallow was functional with only trace oral residuals with solids.  Pharyngeal swallow minimally weak with resultant vallecular residuals with solids which pt largely  senses and clears with dry swallows.  Minimal liquid residuals noted at valleculae and pyriform sinus without consistent awareness.     Pt's largest aspiration risk unfortunately appears to be esophageal.  Appearance of poor clearance of esophagus noted and SLP requested Dr Rosana Hoes *radiologist* view pt's esophagus due to positioning difficulties for  full esophagram with pt's quadriparesis.  Dr Rosana Hoes reported pt with "dysmotility and significant reflux" to proximal esophagus.  Dedicated esophageal evaluation not conducted as per Dr Rosana Hoes, further information would not be obtained through esophagram.  Of note, pt also became fatigued from testing, causing concern for ability to meet nutrition regardless of esophageal concerns.  Skilled intervention included educating pt to findings using live video and reinforcing understanding.  SLP to follow.  Impact on safety and function Severe aspiration risk;Risk for inadequate nutrition/hydration   CHL IP TREATMENT RECOMMENDATION 11/16/2015 Treatment Recommendations Therapy as outlined in treatment plan below   Prognosis 11/16/2015 Prognosis for Safe Diet Advancement Guarded Barriers to Reach Goals Severity of deficits Barriers/Prognosis Comment -- CHL IP DIET RECOMMENDATION 11/16/2015 SLP Diet Recommendations Ice chips PRN after oral care Liquid Administration via -- Medication Administration Via alternative means Compensations -- Postural Changes --   CHL IP OTHER RECOMMENDATIONS 11/16/2015 Recommended Consults -- Oral Care Recommendations Oral care QID Other Recommendations --   CHL IP FOLLOW UP RECOMMENDATIONS 11/15/2015 Follow up Recommendations (No Data)   CHL IP FREQUENCY AND DURATION 11/16/2015 Speech Therapy Frequency (ACUTE ONLY) min 2x/week Treatment Duration 2 weeks      CHL IP ORAL PHASE 11/16/2015 Oral Phase Impaired Oral - Pudding Teaspoon -- Oral - Pudding Cup -- Oral - Honey Teaspoon -- Oral - Honey Cup -- Oral - Nectar Teaspoon -- Oral - Nectar Cup -- Oral - Nectar Straw WFL Oral - Thin Teaspoon WFL Oral - Thin Cup WFL Oral - Thin Straw WFL Oral - Puree WFL Oral - Mech Soft -- Oral - Regular WFL Oral - Multi-Consistency -- Oral - Pill -- Oral Phase - Comment --  CHL IP PHARYNGEAL PHASE 11/16/2015 Pharyngeal Phase Impaired Pharyngeal- Pudding Teaspoon -- Pharyngeal -- Pharyngeal- Pudding Cup -- Pharyngeal  -- Pharyngeal- Honey Teaspoon -- Pharyngeal -- Pharyngeal- Honey Cup -- Pharyngeal -- Pharyngeal- Nectar Teaspoon -- Pharyngeal -- Pharyngeal- Nectar Cup -- Pharyngeal -- Pharyngeal- Nectar Straw Pharyngeal residue - valleculae;Pharyngeal residue - pyriform Pharyngeal -- Pharyngeal- Thin Teaspoon Pharyngeal residue - valleculae;Pharyngeal residue - pyriform Pharyngeal -- Pharyngeal- Thin Cup Pharyngeal residue - valleculae;Pharyngeal residue - pyriform Pharyngeal -- Pharyngeal- Thin Straw Penetration/Aspiration during swallow Pharyngeal Material enters airway, remains ABOVE vocal cords then ejected out Pharyngeal- Puree Pharyngeal residue - valleculae Pharyngeal -- Pharyngeal- Mechanical Soft -- Pharyngeal -- Pharyngeal- Regular Pharyngeal residue - valleculae Pharyngeal -- Pharyngeal- Multi-consistency -- Pharyngeal -- Pharyngeal- Pill -- Pharyngeal -- Pharyngeal Comment pt largely sensed mild pharyngeal residuals and dry swallows faciliate clearance  CHL IP CERVICAL ESOPHAGEAL PHASE 11/16/2015 Cervical Esophageal Phase Impaired Pudding Teaspoon -- Pudding Cup -- Honey Teaspoon -- Honey Cup -- Nectar Teaspoon -- Nectar Cup -- Nectar Straw -- Thin Teaspoon -- Thin Cup -- Thin Straw -- Puree -- Mechanical Soft -- Regular -- Multi-consistency -- Pill -- Cervical Esophageal Comment appearance of poor clearance of esophagus, SLP requested Dr Rosana Hoes *radiologist* view pt's esophagus due to positioning difficulties for esophagram with her quadriparesis; Dr Rosana Hoes reported pt with dysmotility and significant reflux to proximal esophagus; dedicated esophageal evaluation not conducted as per Dr Rosana Hoes, further information would not be obtained through esophagram No flowsheet data found.  Janett Labella [place], [place] [facility] SLP [phone]               Microbiology: No results found for this or any previous visit (from the past 240 hour(s)).   Labs: Basic Metabolic Panel:  Recent Labs Lab 11/22/15 0309  11/23/15 0323 11/24/15 0328 11/25/15 0326 11/26/15 0313  NA 139 139 138 139 138  K 4.9 4.5 4.3 3.9 4.1  CL 98* 97* 97* 98* 96*  CO2 31 31 30 31  32  GLUCOSE 124* 100* 100* 142* 153*  BUN 20 27* 28* 30* 27*  CREATININE <0.30* 0.37* 0.37* 0.35* 0.33*  CALCIUM 10.0 9.9 10.3 9.8 9.7  MG 2.4  --   --  2.2  --    Liver Function Tests: No results for input(s): AST, ALT, ALKPHOS, BILITOT, PROT, ALBUMIN in the last 168 hours. No results for input(s): LIPASE, AMYLASE in the last 168 hours. No results for input(s): AMMONIA in the last 168 hours. CBC:  Recent Labs Lab 11/21/15 0323 11/22/15 0309 11/25/15 0326  WBC 8.0 8.3 7.7  NEUTROABS 5.7  --   --   HGB 7.9* 8.7* 8.4*  HCT 25.2* 27.6* 27.1*  MCV 97.7 98.2 100.0  PLT 366 381 392   Cardiac Enzymes: No results for input(s): CKTOTAL, CKMB, CKMBINDEX, TROPONINI in the last 168 hours. BNP: BNP (last 3 results) No results for input(s): BNP in the last 8760 hours.  ProBNP (last 3 results) No results for input(s): PROBNP in the last 8760 hours.  CBG:  Recent Labs Lab 11/26/15 1935 11/26/15 2347 11/27/15 0415 11/27/15 0755 11/27/15 1153  GLUCAP 120* 100* 103* 113* 146*       Signed:  Jesiel Garate MD, PhD  [facility] 11/27/2015, 2:20 PM

## 2015-11-26 NOTE — Progress Notes (Signed)
Speech Language Pathology Treatment: Dysphagia;Passy Muir Speaking valve  Patient Details Name: Angela Page MRN: [id number] DOB: Dec 15, 1970 Today's Date: 11/26/2015 Time: 1006-1050 SLP Time Calculation (min) (ACUTE ONLY): 44 min  Assessment / Plan / Recommendation Clinical Impression  Pt now npo due to red jello being suctioned from trachea and MD expressed desire for SLP to see patient again.  SLP placed PMSV with pt assuring she expectorated secretions first.  Suspect some chronic aspiration of secretions evidenced by gurgly breathing quality - however pt is able to clear.  Great tolerance of valve with no breath stacking and all vitals stable.  Pt able to expectorate secretions orally by coughing and propelling with pharyngeal lingual base effectively!   Regarding pt's swallowing, reviewed MBS with pt using video of her test.  Pt states today that on day when she has red secretions suctioned, she also consumed a RED popsicle around the same time as red jello.  SLP questions if pt may have aspirated some secretions colored red from popsicle/jello?  Pt denies choking on anything she consumed or sensing reflux.  Pt did admit to not being fully upright and consuming all of the popsicle and jello.  Advised pt that significant reflux was seen with only single bolus of barium - therefore advised pt only consume few tsps or few bites of popsicle at a single time due to aspiration risk.  Pt agreeable to plan with accepted risks.  Advised to only advance to full liquids as tolerated, importance of monitoring vitals, etc due to silent nature of dysphagia and importance of oral care due to secretion aspiration.     Using teach back, pt reiterated aspiration risk/precautions and indication for valve use/precautions.   Please order [place] SLP short term for dysphagia/PMSV management/education with pt's mother if you agree. Thanks for allowing me to help care for this pt.     HPI HPI: 45 yo female adm to [facility] with  leukocytosis and AMS 10/31/15.  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  Pt with h/o C2-C7 fusion and C3-C6 osteophytic changes per imaging study.  Pt resides with her caregiver mom per RN.  Hospital coarse complicated by pt requiring intubation for respiratory compromise, found to have occlusion of right bronchus with collapse of RM and RL lungs concerning for aspiration, left lung findings also concerning for aspiration.  Pt was placed on vent 10/12-10/17 - reintubated evening of 10/17 and trach placed 10/23.  Pt has been off the vent today full day and tolerating well.  Recent hospitalization for pna noted; pt coded and required defibrillation.  She also has h/o vent use for 3 months and trach *removed previously.  PMSV and swallow eval ordered.       SLP Plan  Continue with current plan of care     Recommendations  Diet recommendations: Thin liquid (with known aspiration risks) Liquids provided via: Teaspoon Medication Administration: Via alternative means Supervision: Full supervision/cueing for compensatory strategies Compensations: Slow rate;Small sips/bites;Multiple dry swallows after each bite/sip;Clear throat intermittently Postural Changes and/or Swallow Maneuvers: Seated upright 90 degrees;Upright 30-60 min after meal (as able with wounds)      Patient may use Passy-Muir Speech Valve:  (with RN and SLP) PMSV Supervision: Full MD: Please consider changing trach tube to : Cuffless         Oral Care Recommendations: Oral care BID Follow up Recommendations: Home health SLP;Skilled Nursing facility Plan: Continue with current plan of care  [place], [place] [facility] SLP [phone]

## 2015-11-26 NOTE — Progress Notes (Signed)
Nutrition Follow-up  DOCUMENTATION CODES:   Non-severe (moderate) malnutrition in context of acute illness/injury, Underweight  INTERVENTION:  - Pt will require order for TF pump for home use. - Recommend home TF regimen: Osmolite 1.2 @ 90 mL/hr over 16 hours/day with 30 mL Prostat BID and 100 mL free water QID. This regimen will provide 1928 kcal, 110 grams of protein, and 1581 mL free water.    NUTRITION DIAGNOSIS:   Inadequate oral intake related to inability to eat as evidenced by NPO status. -ongoing at this time.  GOAL:   Patient will meet greater than or equal to 90% of their needs -met with TF at goal rate.   MONITOR:   TF tolerance, Weight trends, Labs, Skin, I & O's  ASSESSMENT:   45 y.o. 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 Presented with worsening confusion status decreased by mouth intake foul-smelling decubitus ulcer. Apparently has been more sedated than usual at home; family did endorse that patient may have possibly received more narcotics than usual. Family also states that she has had decreased by mouth intake. Mother was very concerned that patient developed lung cancer based on the fact that her father died of it and want patient to be tested extensively. In ED patient required Narcan 2 and responded: suggestive of opioid overdose contributing to oversedation  11/6 Pt continues with trach collar. PEG now in place with pt receiving Osmolite 1.2 @ 60 mL/hr with 30 mL Prostat BID and 100 mL free water TID without issue; this regimen is providing 1928 kcal, 110 grams of protein, and 1481 mL free water. Per chart review, weight +5.1 kg since 11/2. Pt to d/c home today and recommendation for home TF outlined  above. Pt may be able to transition to bolus feedings in the future but would not d/c on bolus as this has not been attempted in the hospital; defer decision to transition to bolus feeds to [place].  Spoke with SLP concerning PO possibility. She reports likely able to do up to FLD per pt desire. Monitor for SLP note with associated recommendations.  Medications reviewed; PRN Dulcolax, 0.5 mg oral Klonopin BID, 100 mg Colace BID, sliding scale Novolog, PRN IV Zofran, 17 g Miralax/day, PRN Senokot. Labs reviewed; CBGs: 116 and 149 mg/dL this AM, Cl: 96 mmol/L, BUN: 27 mg/dL, creatinine: 0.33 mg/dL.     11/3 - Pt continues with trach collar.  - Diet advanced to CLD 11/1 at 1127 and no intakes documented since that time.  - Pt sleeping at time of RD visit with no family/visitors present.  - Per Dr. Phoebe Sharps note this AM, pt required suctioning after attempting to consume jello and was subsequently made NPO.  - Note also states ileus now resolving.  - TF off at time of RD visit. Spoke with RN who reports tentative plan for PEG today and TF turned off in anticipation.  - Change in TF formula ordered in anticipation of d/c and forseeable insurance coverage of TF formula.  - Weight -3.6 kg from 11/1-11/2.    11/1 - Pt continues with NGT and Vital 1.2 @ 65 mL/hr which is providing 1872 kcal, 117 grams of protein, and 1265 mL free water.  - Free water flush: 100 mL TID.  - Pt working with SLP with PMV at  time of RD visit.  - Pt denies any nutrition-related questions or concerns at this time.  - IR has been consulted for PEG placement; note from this AM from IR reviewed.  - Weight stable from yesterday.  Diet Order:  Diet NPO time specified Except for: Ice Chips  Skin: Stage 4 pressure injuries to coccyx and bilateral buttocks, Stage 1 pressure injuries to mid, upper, bilateral back and L shoulder, Unstageable wound to cervical area.    Last BM:  11/5  Height:   Ht Readings from Last  1 Encounters:  11/07/15 _0  (1.575 m)    Weight:   Wt Readings from Last 1 Encounters:  11/26/15 97 lb 10.6 oz (44.3 kg)    Ideal Body Weight:  47.5 kg  BMI:  Body mass index is 17.86 kg/m.  Estimated Nutritional Needs:   Kcal:  1816-2043 (40-45 kcal/kg)  Protein:  100-114 grams (2.2-2.5 grams/kg)  Fluid:  >/= 1.8 L/day  EDUCATION NEEDS:   No education needs identified at this time    Jarome Matin, MS, RD, LDN Inpatient Clinical Dietitian Pager # [phone] After hours/weekend pager # [phone]

## 2015-11-26 NOTE — Progress Notes (Signed)
Placed on room air, tolerated  well sats 92 % on room air.

## 2015-11-26 NOTE — Progress Notes (Signed)
PROGRESS NOTE    Angela Page  MRN:[id number] DOB: 03/17/1970 DOA: 10/31/2015 PCP: PROVIDER NOT IN SYSTEM   Brief Narrative: 45 y.o. female with medical history significant of traumatic spinal injury with paraplegia s/p traumatic fall with C2-7 posterior fusion 10/08/13 , recurrent UTI, decubitus ulcer, malnutrition, recurrent sepsis, Sick sinus syndrome status post [employer] dual Chamber Pacemaker placement 11/03/13, presented with worsening confusion, decreased by mouth intake, foul-smelling decubitus ulcer.  She developed progressive hypotension and respiratory failure from HCAP and a UTI leading to septic shock. Completed antibiotics course. Patient is extubated. Evaluation for nutrition ongoing. CCM transferred to [facility] on 11/17/2015, on 17th days of inpatient hospitalization.  S/p trach, peg, indwelling foley  Assessment & Plan:   #Acute hypoxic/hypercapnic respiratory failure secondary to aspiration pna/MDRO ACINETOBACTER UTI/septic shock/metabolic encephalopathy Patient was admitted on 10/11 with altered mental status , sepsis from uit/pna, the next day she was put on pressor and intubated due to worsening of mental status and developed septic shock required pressure.  She failed extubation and had to be reintubated on 10/17night, s/p trach on 10/23,   Improved. Patient completed antibiotics course as per medical record  Pulm recommended trach OK to change to #6 cuffless prior to discharge -continue supportive care. Suctions and trach care Increase secretion noticed on 11/3, repeat cxr with decreased left pleural effusion, but persistent right effusion, chest vest continue nutrition support, not able to use lasix due to borderline bp, Improving, stable  #ileus: patient denies abdominal pain, no n/v, tolerating tube feeds. Report + flatus, no bm   keep k>4, mag>2,  kub daily showed  ileus has improved, s/p Peg placement by interventional radiology on 11/3 gtube on suction briefly,  s/p enema with good result, repeat kub ileus resolved ,  tube feeds started on 11/4 pm  # Protein-calorie malnutrition, severe (HCC): Speech and swallow evaluation note reviewed.  aspiration risk appeared to be both pharyngeal and esophageal. As respiratory suctioned out jello on 11/3, now npo except ice chips  s/p PEG tube placement on 11/3 Speech and swallow team, dietary consult appreciated.  "30 mL Prostat BID and Osmolite 1.2 @ 30 mL/hr to advance by 10 mL every 4 hours to reach goal rate of Osmolite 1.2 @ 60 mL/hr"   Chronic Decubitus ulcer of sacral region, stage 4 (HCC)/chronic coccyx osteomyelitis. Wound care, has indwelling foley.     Quadriplegia, C5-C7 incomplete (HCC): bedridden, indwelling catheter (being changed qmonth, last changed on 10/30) Continue baclofen and supportive care  H/o SSS s/p pacemaker, chronic asymptomatic hypotension( autonomic instability). Her home midodrine was continued.     Chronic pain: on baclofen, neurontin, prn oxycodone, patient asked to increase oxycodone to 2tabs every 4hrs, I have explained to her with her poor respiratory status and borderline blood pressure, it is not safe to increase oxycodone, in fact , she looks comfortable on exam on iv morphine img 2-3 times a day.    Severe sepsis (HCC)     Lower urinary tract infectious disease   Acute encephalopathy   History of asthma and tobacco abuse   Ileus: Improved   Hyperglycemia due to steroid   DVT prophylaxis: Lovenox subcutaneous Code Status: Full code Family Communication: No family present at bedside Disposition Plan:  discharge  [facility]/6.  Patient is declining SNF at this time.  Consultants:   Pulmonary critical care.  Interventional radiologist  Palliative care  Procedures: Lt femoral aline 10/13 >> 10/14 ETT 10/13 >> 10/17, 10/18 >>    Lt IJ CVL 10/13 >> 10/26 Trach 10/23 Peg 11/3  SIGNIFICANT EVENTS: 10/11  Admit 10/13  VDRF,  pressors, bronch 10/15  Vasopressors off  10/16  Fever, weaning on PSV 10/5 10/17  Extubated, failed and reintubated overnight 10/20  Weaning on PSV, plan for trach 10/23 10/23 Tracheostomy  Antimicrobials: Vancomycin 10/11 >> 10/15 Levaquin 10/11 >> 10/12 Aztreonam 10/11 >> 10/12  Diflucan 10/12 >> 10/14 Primaxin 10/12 >> 10/22  STUDIES:  CT chest 10/12 >> occlusion of Rt bronchus intermedius, basilar consolidation, small Rt effusion Bronchoscopy 10/13 >> significant purulent secretions airways CT Pelvis 10/16 >> no evidence of emphysematous fasciitis along the perineum, stage IV decubitus ulcers along the sacrum / ischial tuberosities associated with chronic osteomyelitis CT abdomen 10/24 > ileus, anasarca  CULTURES: Blood 10/11 >> neg Urine 10/11 >> Acinetobacter, yeast Sputum 10/13 >> neg  Subjective:   patient was seen and examined at bedside On 5liter oxygen through trach,  S/p peg tube placement, tolerating tube feeds,  denies pain, no n/v,  + flatus, had bm after enema.  no fever Want to go home   Objective: Vitals:   11/26/15 1700 11/26/15 1800 11/26/15 1926 11/26/15 2000  BP:  120/63    Pulse:   88   Resp: _0 Temp:    98.2 F (36.8 C)  TempSrc:    Oral  SpO2: 92% 91%    Weight:      Height:        Intake/Output Summary (Last 24 hours) at 11/26/15 2156 Last data filed at 11/26/15 1900  Gross per 24 hour  Intake             1800 ml  Output             1585 ml  Net              215 ml   Filed Weights   11/24/15 0500 11/25/15 0451 11/26/15 0500  Weight: 39.8 kg (87 lb 11.9 oz) 42.5 kg (93 lb 11.1 oz) 44.3 kg (97 lb 10.6 oz)    Examination:  General exam:Cachectic female, not in distress, s/p trach, s/p peg, indwelling foley Neck: Trachea stoma site clean with no bleeding. Respiratory system: diminished at basis, no wheeze Cardiovascular system: Regular rate rhythm, S1 and S2 normal. No pedal edema, s/p pacemaker Gastrointestinal system:  Bowel sound positive, abdomen soft, nontender and not distended, s/p peg placement Central nervous system: Alert, awake, following commands and oriented. Extremities: paraplegia Skin: chronic sacral decubitus ulcer Psychiatry: Judgement and insight appear normal. Mood & affect appropriate.     Data Reviewed: I have personally reviewed following labs and imaging studies  CBC:  Recent Labs Lab 11/21/15 0323 11/22/15 0309 11/25/15 0326  WBC 8.0 8.3 7.7  NEUTROABS 5.7  --   --   HGB 7.9* 8.7* 8.4*  HCT 25.2* 27.6* 27.1*  MCV 97.7 98.2 100.0  PLT 366 381 453   Basic Metabolic Panel:  Recent Labs Lab 11/22/15 0309 11/23/15 0323 11/24/15 0328 11/25/15 0326 11/26/15 0313  NA 139 139 138 139 138  K 4.9 4.5 4.3 3.9 4.1  CL 98* 97* 97* 98* 96*  CO2 _1 32  GLUCOSE 124* 100* 100* 142* 153*  BUN 20 27* 28* 30* 27*  CREATININE <0.30* 0.37* 0.37* 0.35* 0.33*  CALCIUM 10.0 9.9 10.3 9.8 9.7  MG 2.4  --   --  2.2  --    GFR: Estimated Creatinine Clearance: 62.1  mL/min (by C-G formula based on SCr of 0.33 mg/dL (L)). Liver Function Tests: No results for input(s): AST, ALT, ALKPHOS, BILITOT, PROT, ALBUMIN in the last 168 hours. No results for input(s): LIPASE, AMYLASE in the last 168 hours. No results for input(s): AMMONIA in the last 168 hours. Coagulation Profile:  Recent Labs Lab 11/21/15 0323  INR 0.87   Cardiac Enzymes: No results for input(s): CKTOTAL, CKMB, CKMBINDEX, TROPONINI in the last 168 hours. BNP (last 3 results) No results for input(s): PROBNP in the last 8760 hours. HbA1C: No results for input(s): HGBA1C in the last 72 hours. CBG:  Recent Labs Lab 11/26/15 0342 11/26/15 0732 11/26/15 1133 11/26/15 1603 11/26/15 1935  GLUCAP 149* 116* 102* 110* 120*   Lipid Profile: No results for input(s): CHOL, HDL, LDLCALC, TRIG, CHOLHDL, LDLDIRECT in the last 72 hours. Thyroid Function Tests: No results for input(s): TSH, T4TOTAL, FREET4, T3FREE,  THYROIDAB in the last 72 hours. Anemia Panel: No results for input(s): VITAMINB12, FOLATE, FERRITIN, TIBC, IRON, RETICCTPCT in the last 72 hours. Sepsis Labs: No results for input(s): PROCALCITON, LATICACIDVEN in the last 168 hours.  No results found for this or any previous visit (from the past 240 hour(s)).       Radiology Studies: No results found.      Scheduled Meds: . baclofen  10 mg Oral TID  . chlorhexidine gluconate (MEDLINE KIT)  15 mL Mouth Rinse BID  . clonazepam  0.5 mg Oral BID  . collagenase   Topical Daily  . docusate  100 mg Oral BID  . enoxaparin (LOVENOX) injection  30 mg Subcutaneous QHS  . feeding supplement (OSMOLITE 1.5 CAL)  1,000 mL Per Tube Q24H  . feeding supplement (PRO-STAT SUGAR FREE 64)  30 mL Per Tube BID  . free water  100 mL Per Tube Q6H  . gabapentin  300 mg Per Tube TID  . insulin aspart  0-9 Units Subcutaneous Q4H  . ipratropium-albuterol  3 mL Nebulization Q6H  . mouth rinse  15 mL Mouth Rinse QID  . midodrine  10 mg Oral TID WC  . neomycin-bacitracin-polymyxin  1 application Topical Daily  . polyethylene glycol  17 g Per Tube Daily  . ranitidine  150 mg Per Tube Q12H  . sennosides  5 mL Per Tube BID  . sodium chloride flush  3 mL Intravenous Q12H  . venlafaxine  75 mg Per Tube BID  . zolpidem  5 mg Oral QHS   Continuous Infusions:    LOS: 26 days    Time spent: 84mnutes.    XFlorencia Reasons MD PhD [facility] Pager [phone] If 7PM-7AM, please contact night-coverage [email or web address] Password [email or web address] 11/26/2015, 9:56 PM

## 2015-11-26 NOTE — Progress Notes (Signed)
Anxiously awaiting discharge home. Has called mother to confirm equipment arrival. Found out nurse approval has not gone through yet. Very sad and tearful, asking for Morphine and anxiety medication. Morphine given for discomfort.

## 2015-11-27 LAB — GLUCOSE, CAPILLARY
GLUCOSE-CAPILLARY: 103 mg/dL — AB (ref 65–99)
GLUCOSE-CAPILLARY: 135 mg/dL — AB (ref 65–99)
Glucose-Capillary: 113 mg/dL — ABNORMAL HIGH (ref 65–99)
Glucose-Capillary: 146 mg/dL — ABNORMAL HIGH (ref 65–99)

## 2015-11-27 MED ORDER — OSMOLITE 1.5 CAL PO LIQD
1000.0000 mL | ORAL | Status: DC
  Administered 2015-11-27: 1000 mL
  Filled 2015-11-27 (×2): qty 1000

## 2015-11-27 MED ORDER — OSMOLITE 1.5 CAL PO LIQD: 1000.0000 mL | ORAL | 0 refills | Status: DC

## 2015-11-27 MED ORDER — MAGNESIUM HYDROXIDE 400 MG/5ML PO SUSP
15.0000 mL | Freq: Once | ORAL | Status: AC
  Administered 2015-11-27: 15 mL
  Filled 2015-11-27: qty 30

## 2015-11-27 MED ORDER — IPRATROPIUM-ALBUTEROL 0.5-2.5 (3) MG/3ML IN SOLN
3.0000 mL | Freq: Three times a day (TID) | RESPIRATORY_TRACT | Status: DC
  Filled 2015-11-27: qty 3

## 2015-11-27 MED ORDER — FREE WATER: 100.0000 mL | Freq: Three times a day (TID) | 0 refills | Status: DC

## 2015-11-27 NOTE — Progress Notes (Signed)
Date:  November 27, 2015 [facility] hours have been approved/ pt for dc today/via [facility] at 4 pm today/mother and patient informed. Velva Harman, BSN, [place], [place]

## 2015-12-19 ENCOUNTER — Ambulatory Visit (HOSPITAL_COMMUNITY)
Admission: RE | Admit: 2015-12-19 | Discharge: 2015-12-19 | Disposition: A | Discharge status: Home / Self Care | Payer: Medicaid Other | Source: Ambulatory Visit | Attending: Acute Care | Admitting: Acute Care

## 2015-12-19 DIAGNOSIS — Z930 Tracheostomy status: Secondary | ICD-10-CM

## 2015-12-19 DIAGNOSIS — Z430 Encounter for attention to tracheostomy: Secondary | ICD-10-CM | POA: Diagnosis present

## 2015-12-19 NOTE — Progress Notes (Signed)
Tracheostomy Procedure Note  Angela Page PO:[id number] 1970/06/28  Pre Procedure Tracheostomy Information  Trach Brand: Shiley Size: 6.0 Style: Uncuffed Secured by: Velcro   Procedure: evaluation of possible decannulation    Post Procedure Tracheostomy Information  Trach Brand: Shiley Size: 6.0 Style: Uncuffed Secured by: Velcro   Post Procedure Evaluation: Vital signs:blood pressure 91/59, pulse 80, respirations 18 and pulse oximetry 98 % Patients current condition: stable Complications: No apparent complications Trach site exam: clean, dry Wound care done: dry Patient did tolerate procedure well.   Education: Changed inner cannula to the red occlusive inner cannula to determine if patient is ready for decannulation. Instructed patient on care with this cannula.  Will have patient return in 6 weeks to assess.   Prescription needs:    Additional needs: Patient needed spare 6 CFS trach, and 4 CFS back up trach.  Supplies sent home with patient.

## 2015-12-19 NOTE — Progress Notes (Signed)
  Subjective:  [name] for trach care   Patient ID: Angela Page, female    DOB: Dec 11, 1970, 45 y.o.   MRN: [id number]  HPI [name] is well known to me. She has a sig h/o paraplegia s/p traumatic fall w/ resultant C-spine injury. She has frequent admits for infections (usually [place] source).  Most recently admitted for HCAP w/ resultant septic shock. This was a prolonged hospital stay which was complicated by prolonged ventilator dependence and ultimately required tracheostomy placement. She has since been weaned off the vent. Has a PEG tube for her nutrition and is working with SLP. Per her she still has some swallowing issues but the SLP reports she is improving and that her goal is eventually to eat again. She presents today for routine f/u.   Review of Systems  Constitutional: Negative for activity change, appetite change, chills, diaphoresis and fever.  HENT: Negative for congestion, postnasal drip, sinus pain, sinus pressure, sneezing, sore throat and trouble swallowing.   Eyes: Negative.   Respiratory: Negative for apnea, choking, chest tightness and shortness of breath.   Cardiovascular: Negative.   Gastrointestinal: Negative.   Endocrine: Negative.   Neurological: Negative.   Hematological: Negative.       Vital signs:blood pressure 91/59, pulse 80, respirations 18 and pulse oximetry 98 % Objective:   Physical Exam  Constitutional: She is oriented to person, place, and time. She appears well-developed and well-nourished. No distress.  HENT:  Head: Normocephalic and atraumatic.  Mouth/Throat: Oropharynx is clear and moist.  Eyes: Conjunctivae and EOM are normal. Pupils are equal, round, and reactive to light.  Neck: Normal range of motion.  Lurline Idol is unremarkable.   Pulmonary/Chest: Effort normal and breath sounds normal. No respiratory distress. She has no wheezes. She has no rales.  Abdominal: Soft. Bowel sounds are normal.  Musculoskeletal: She exhibits deformity. She exhibits no  edema.  Paraplegic   Neurological: She is alert and oriented to person, place, and time.  Skin: Skin is warm and dry. She is not diaphoretic.  Psychiatric: She has a normal mood and affect.   Trach BrandCristy Hilts Size: 6.0 Style: Uncuffed Secured by: Velcro     Assessment & Plan:  Tracheostomy dependence  Dysphagia  C-spine injury  Poor cough mechanics S/p recent prolonged critical illness  Discussion Looking stronger. Doing PMV for most hours of the day. Eating some. Cough is strong  Plan Will initiate capping trials Continue HS w/ trach uncapped Will consider decannulation if she does well w/ next 6 weeks of capping She understands that should she end up intubated and w/ trach in future we cannot recommend decannulation again in the future   Erick Colace ACNP-BC [place] Pager # [phone] OR # [phone] if no answer

## 2016-01-02 DIAGNOSIS — Z930 Tracheostomy status: Secondary | ICD-10-CM | POA: Insufficient documentation

## 2016-01-09 ENCOUNTER — Encounter (HOSPITAL_COMMUNITY): Payer: Self-pay

## 2016-01-09 ENCOUNTER — Emergency Department (HOSPITAL_COMMUNITY)
Admission: EM | Admit: 2016-01-09 | Discharge: 2016-01-09 | Disposition: A | Discharge status: Home / Self Care | Payer: Medicaid Other | Attending: Emergency Medicine | Admitting: Emergency Medicine

## 2016-01-09 ENCOUNTER — Emergency Department (HOSPITAL_COMMUNITY): Payer: Medicaid Other

## 2016-01-09 DIAGNOSIS — R0602 Shortness of breath: Secondary | ICD-10-CM | POA: Diagnosis present

## 2016-01-09 DIAGNOSIS — Z87891 Personal history of nicotine dependence: Secondary | ICD-10-CM | POA: Insufficient documentation

## 2016-01-09 DIAGNOSIS — Z79899 Other long term (current) drug therapy: Secondary | ICD-10-CM | POA: Diagnosis not present

## 2016-01-09 DIAGNOSIS — Z950 Presence of cardiac pacemaker: Secondary | ICD-10-CM | POA: Insufficient documentation

## 2016-01-09 DIAGNOSIS — J45909 Unspecified asthma, uncomplicated: Secondary | ICD-10-CM | POA: Insufficient documentation

## 2016-01-09 LAB — CBC WITH DIFFERENTIAL/PLATELET
BASOS ABS: 0 10*3/uL (ref 0.0–0.1)
BASOS PCT: 0 %
EOS ABS: 1.2 10*3/uL — AB (ref 0.0–0.7)
EOS PCT: 10 %
HCT: 37.9 % (ref 36.0–46.0)
HEMOGLOBIN: 12.8 g/dL (ref 12.0–15.0)
Lymphocytes Relative: 22 %
Lymphs Abs: 2.7 10*3/uL (ref 0.7–4.0)
MCH: 31.5 pg (ref 26.0–34.0)
MCHC: 33.8 g/dL (ref 30.0–36.0)
MCV: 93.3 fL (ref 78.0–100.0)
Monocytes Absolute: 0.7 10*3/uL (ref 0.1–1.0)
Monocytes Relative: 6 %
NEUTROS PCT: 62 %
Neutro Abs: 8 10*3/uL — ABNORMAL HIGH (ref 1.7–7.7)
PLATELETS: 453 10*3/uL — AB (ref 150–400)
RBC: 4.06 MIL/uL (ref 3.87–5.11)
RDW: 16.2 % — ABNORMAL HIGH (ref 11.5–15.5)
WBC: 12.6 10*3/uL — AB (ref 4.0–10.5)

## 2016-01-09 LAB — COMPREHENSIVE METABOLIC PANEL
ALBUMIN: 3.8 g/dL (ref 3.5–5.0)
ALK PHOS: 174 U/L — AB (ref 38–126)
ALT: 42 U/L (ref 14–54)
AST: 48 U/L — ABNORMAL HIGH (ref 15–41)
Anion gap: 10 (ref 5–15)
BUN: 35 mg/dL — ABNORMAL HIGH (ref 6–20)
CALCIUM: 9.8 mg/dL (ref 8.9–10.3)
CHLORIDE: 97 mmol/L — AB (ref 101–111)
CO2: 30 mmol/L (ref 22–32)
CREATININE: 0.35 mg/dL — AB (ref 0.44–1.00)
GFR calc non Af Amer: >60 mL/min (ref >60)
GLUCOSE: 83 mg/dL (ref 65–99)
Potassium: 4.7 mmol/L (ref 3.5–5.1)
SODIUM: 137 mmol/L (ref 135–145)
Total Bilirubin: 0.5 mg/dL (ref 0.3–1.2)
Total Protein: 7.4 g/dL (ref 6.5–8.1)

## 2016-01-09 LAB — URINALYSIS, ROUTINE W REFLEX MICROSCOPIC
Bilirubin Urine: NEGATIVE
GLUCOSE, UA: NEGATIVE mg/dL
KETONES UR: NEGATIVE mg/dL
NITRITE: NEGATIVE
PROTEIN: NEGATIVE mg/dL
Specific Gravity, Urine: 1.009 (ref 1.005–1.030)
Squamous Epithelial / LPF: NONE SEEN
pH: 6 (ref 5.0–8.0)

## 2016-01-09 LAB — TROPONIN I

## 2016-01-09 LAB — BRAIN NATRIURETIC PEPTIDE: B Natriuretic Peptide: 30.4 pg/mL (ref 0.0–100.0)

## 2016-01-09 MED ORDER — CLINDAMYCIN HCL 150 MG PO CAPS: 450.0000 mg | ORAL_CAPSULE | Freq: Four times a day (QID) | ORAL | 0 refills | Status: AC

## 2016-01-09 NOTE — ED Notes (Signed)
Pt denies pain, pt is breathing unlabored and appears without distress.

## 2016-01-09 NOTE — ED Notes (Signed)
[facility] called for transport.  

## 2016-01-09 NOTE — Discharge Instructions (Signed)
Your x-rays today were abnormal.  The radiologists thought there were some signs suggesting pneumonia and fluid.  Given your symptoms, we will treat for pneumonia as an outpatient with an antibiotic called clindamycin.  Please take your antibiotics as indicated.    You may continue using your home albuterol inhaler and nebulizer for chest discomfort and shortness of breath.   Please read the attached information on "shortness of breath", "aspiration pneumonia" and "community acquired pneumonia".    Return to the emergency department if your shortness of breath comes back, if you develop fever, chest pain or productive cough.   Follow up with your primary care provider in the next 2-3 days to make sure you are responding to the medication.

## 2016-01-09 NOTE — ED Notes (Signed)
Patient was alert, oriented and stable upon discharge. RN went over AVS and patient had no further questions.  

## 2016-01-09 NOTE — ED Provider Notes (Signed)
[facility] DEPT Provider Note   CSN: [id number] Arrival date & time: 01/09/16  1414   History   Chief Complaint Chief Complaint  Patient presents with  . Shortness of Breath    HPI Develyn Page is a 45 y.o. 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  Pt states she woke up this morning ~0400 and felt short of breath, her nurse measured her O2 sats and it was 86%.  Pt states she used her mother's oxygen at home and her oxygen sats improved to 97% after two hours.  Pt states she stopped using the oxygen and her O2 sats dropped back down to 86-86%.  Given pt previous hospitalization for PNA pt was concerned and came to ED. Pt denies shortness of breath currently.   HPI  Past Medical History:  Diagnosis Date  . Asthma   . GERD (gastroesophageal reflux disease)   . Pacemaker   . Pleurisy   . Quadriparesis [facility])     Patient Active Problem List   Diagnosis Date Noted  . Tracheostomy status ([place])   . Palliative care encounter   . Goals of care, counseling/discussion   . Esophageal dysphagia   . Shortness of breath   . Acute respiratory failure with hypoxemia ([place])   . Encounter for intubation   . Malnutrition of moderate degree 11/01/2015  . Acute encephalopathy 10/31/2015  . Tobacco abuse 10/31/2015  . Asthma 10/31/2015  . Pressure ulcer 02/26/2015  . HCAP (healthcare-associated pneumonia) 02/25/2015  . Lower urinary tract infectious disease 02/25/2015  . Hypotension 02/25/2015  . Anemia 02/25/2015  . Enterococcus UTI 02/03/2015  . Sepsis ([place]) 01/31/2015  . RLL pneumonia ([place]) 01/31/2015  . Altered mental status 10/29/2014  . Encephalopathy, toxic 10/29/2014  . Toxic metabolic  encephalopathy [id number]  . Urinary tract infection due to Proteus 10/24/2014  . Leukocytosis 10/23/2014  . Hypokalemia 10/23/2014  . Decubitus ulcer of sacral region, stage 4 ([place]) 10/23/2014  . Anemia, iron deficiency 10/23/2014  . Severe sepsis ([place])   . Septic shock ([place]) 10/22/2014  . Quadriplegia, C5-C7 incomplete ([place]) 10/22/2014  . Depression   . Protein-calorie malnutrition, severe ([place]) 09/05/2014  . Overdose of opiate or related narcotic 09/02/2014    Past Surgical History:  Procedure Laterality Date  . APPENDECTOMY    . CARDIAC SURGERY    . I&D EXTREMITY  10/28/2011   Procedure: IRRIGATION AND DEBRIDEMENT EXTREMITY;  Surgeon: Tennis Must, MD;  Location: [facility];  Service: Orthopedics;  Laterality: Right;  Irrigation and debridement right middle finger  . IR GENERIC HISTORICAL  11/23/2015   IR GASTROSTOMY TUBE MOD SED 11/23/2015 [facility]-INTERV RAD    OB History    No data available       Home Medications    Prior to Admission medications   Medication Sig Start Date End Date Taking? Authorizing Provider  albuterol (PROVENTIL HFA;VENTOLIN HFA) 108 (90 BASE) MCG/ACT inhaler Inhale 2 puffs into the lungs every 6 (six) hours as needed. For shortness of breath    Yes Historical Provider, MD  albuterol (PROVENTIL) (2.5 MG/3ML) 0.083% nebulizer solution Take 2.5 mg by nebulization every 6 (six) hours as needed for wheezing or shortness of breath.   Yes Historical Provider, MD  Amino Acids-Protein Hydrolys (FEEDING SUPPLEMENT, PRO-STAT SUGAR FREE 64,) LIQD Place 30 mLs into feeding tube 2 (two) times daily. 11/26/15  Yes Florencia Reasons, MD  baclofen (LIORESAL) 20 MG tablet Take 20 mg by mouth 3 (three) times daily. 12/14/15  Yes Historical Provider, MD  clonazePAM (KLONOPIN) 0.5 MG disintegrating tablet Place 1 tablet (0.5 mg total) into feeding tube at bedtime. 11/26/15  Yes Florencia Reasons, MD  collagenase (SANTYL) ointment Apply topically daily. Patient taking differently:  Apply 1 application topically daily as needed (wound care).  11/26/15  Yes Florencia Reasons, MD  [employer] VITAMIN B12 1000 MCG tablet Place 1 tablet (1,000 mcg total) into feeding tube daily. 11/26/15  Yes Florencia Reasons, MD  doxycycline (VIBRA-TABS) 100 MG tablet Take 100 mg by mouth every 12 (twelve) hours. 11/30/15  Yes Historical Provider, MD  DULoxetine (CYMBALTA) 30 MG capsule Take 30 mg by mouth 2 (two) times daily. 11/30/15  Yes Historical Provider, MD  gabapentin (NEURONTIN) 400 MG capsule Take 400 mg by mouth 4 (four) times daily. 10/16/15  Yes Historical Provider, MD  ibuprofen (ADVIL,MOTRIN) 200 MG tablet Place 2 tablets (400 mg total) into feeding tube every 6 (six) hours as needed for moderate pain. 11/26/15  Yes Florencia Reasons, MD  lidocaine (LIDODERM) 5 % Place 1 patch onto the skin 2 (two) times daily as needed (pain).  04/04/15  Yes Historical Provider, MD  midodrine (PROAMATINE) 10 MG tablet Place 1 tablet (10 mg total) into feeding tube 3 (three) times daily with meals. 11/26/15  Yes Florencia Reasons, MD  neomycin-bacitracin-polymyxin (NEOSPORIN) ointment Apply 1 application topically daily. apply to eye Patient taking differently: Apply 1 application topically daily as needed for wound care. apply to eye 11/26/15  Yes Florencia Reasons, MD  Nutritional Supplements (FEEDING SUPPLEMENT, OSMOLITE 1.5 CAL,) LIQD Place 1,000 mLs into feeding tube daily. 11/27/15  Yes Florencia Reasons, MD  oxyCODONE (OXY IR/ROXICODONE) 5 MG immediate release tablet Place 1 tablet (5 mg total) into feeding tube every 6 (six) hours as needed for moderate pain or severe pain. 11/26/15  Yes Florencia Reasons, MD  polyethylene glycol (MIRALAX / GLYCOLAX) packet Place 17 g into feeding tube daily. Patient taking differently: Place 17 g into feeding tube daily as needed for moderate constipation.  11/27/15  Yes Florencia Reasons, MD  ranitidine (ZANTAC) 150 MG/10ML syrup Place 10 mLs (150 mg total) into feeding tube every 12 (twelve) hours. Patient taking differently: Place 150 mg into feeding  tube 2 (two) times daily as needed for heartburn.  11/26/15  Yes Florencia Reasons, MD  silver sulfADIAZINE (SILVADENE) 1 % cream Apply 1 application topically every other day as needed (for wound care).   Yes Historical Provider, MD  tiZANidine (ZANAFLEX) 4 MG tablet Take 4 mg by mouth 3 (three) times daily. 11/30/15  Yes Historical Provider, MD  Water For Irrigation, Sterile (FREE WATER) SOLN Place 100 mLs into feeding tube every 8 (eight) hours. 11/26/15  Yes Florencia Reasons, MD  acetaminophen (TYLENOL) 500 MG tablet Place 1 tablet (500 mg total) into feeding tube every 6 (six) hours as needed for moderate pain. 11/26/15   Florencia Reasons, MD  baclofen (LIORESAL) 10 MG tablet Place 1 tablet (10 mg total) into feeding tube 3 (three) times daily. Patient not taking: Reported on 01/09/2016 11/26/15   Florencia Reasons, MD  bisacodyl (DULCOLAX) 10 MG suppository Place 1 suppository (10 mg total) rectally daily as needed for moderate constipation. Patient not taking: Reported on 01/09/2016 11/26/15   Florencia Reasons, MD  clindamycin (CLEOCIN) 150 MG capsule Take 3 capsules (450 mg total) by mouth every 6 (six) hours. 01/09/16 01/16/16  Kinnie Feil, PA-C  clonazePAM (KLONOPIN) 0.5 MG tablet Take 0.5 mg by mouth at bedtime as needed for sleep or anxiety. 11/30/15   Historical Provider, MD  gabapentin (NEURONTIN) 250 MG/5ML solution Place 6 mLs (300 mg total) into feeding tube 3 (three) times daily. Patient not taking: Reported on 01/09/2016 11/26/15   Florencia Reasons, MD  Melatonin 3 MG TABS 2 tablets (6 mg total) by PEG Tube route at bedtime as needed (for sleep). Patient not taking: Reported on 01/09/2016 11/26/15   Florencia Reasons, MD  sennosides (SENOKOT) 8.8 MG/5ML syrup Place 5 mLs into feeding tube at bedtime as needed for mild constipation or moderate constipation. Patient not taking: Reported on 01/09/2016 11/26/15   Florencia Reasons, MD  venlafaxine [facility]) 75 MG tablet Place 1 tablet (75 mg total) into feeding tube 2 (two) times daily. Patient not taking:  Reported on 01/09/2016 11/26/15   Florencia Reasons, MD  Water For Irrigation, Sterile (FREE WATER) SOLN Place 100 mLs into feeding tube every 8 (eight) hours. Patient not taking: Reported on 01/09/2016 11/27/15   Florencia Reasons, MD  zinc oxide (BALMEX) 11.3 % CREA cream Apply 1 application topically 3 (three) times daily as needed (for skin redness/irritation).    Historical Provider, MD    Family History Family History  Problem Relation Age of Onset  . Hypertension Maternal Uncle     Social History Social History  Substance Use Topics  . Smoking status: Former Smoker    Packs/day: 1.00    Years: 25.00    Types: Cigarettes  . Smokeless tobacco: Never Used  . Alcohol use Yes     Comment: 2 x weekly     Allergies   Erythromycin; Nitrofurantoin monohyd macro; and Penicillins   Review of Systems Review of Systems  Constitutional: Negative for chills, fatigue and fever.  HENT: Negative for congestion, postnasal drip, rhinorrhea and sore throat.   Eyes: Negative for visual disturbance.  Respiratory: Negative for cough, choking, chest tightness, shortness of breath and wheezing.   Cardiovascular: Negative for chest pain, palpitations and leg swelling.  Gastrointestinal: Negative for abdominal pain, blood in stool, constipation, diarrhea, nausea and vomiting.  Genitourinary: Negative for decreased urine volume, difficulty urinating, dysuria, enuresis, flank pain, frequency, hematuria, pelvic pain, urgency, vaginal discharge and vaginal pain.  Musculoskeletal: Negative for arthralgias.  Skin: Negative for rash.  Neurological: Negative for dizziness, light-headedness and headaches.     Physical Exam Updated Vital Signs BP 111/73   Pulse 80   Temp 98.6 F (37 C) (Oral)   Resp 22   LMP  (LMP Unknown)   SpO2 94%   Physical Exam  Constitutional: She is oriented to person, place, and time. She appears well-developed and well-nourished. No distress.  Pt with tracheotomy.  HENT:  Head:  Normocephalic and atraumatic.  Nose: Nose normal.  Mouth/Throat: Oropharynx is clear and moist. No oropharyngeal exudate.  Eyes: Conjunctivae and EOM are normal. Pupils are equal, round, and reactive to light.  Neck: Normal range of motion. Neck supple.  Cardiovascular:  RRR, S1 and S2 normal without murmurs.  Intact and symmetric radial and DP pulses bilaterally. No JVD.   Pulmonary/Chest:  No respiratory distress. Pt speaking in full sentences.  Effort normal, lungs CTAB without wheezing, rales or chest tenderness.   Abdominal: Soft. Bowel sounds are normal. She exhibits no distension. There is no tenderness.  No  suprapubic tenderness. No CVAT bilaterally.   Lymphadenopathy:    She has no cervical adenopathy.  Neurological: She is alert and oriented to person, place, and time.  Paraplegia.  Skin: Skin is warm and dry. Capillary refill takes less than 2 seconds.  Sacral decubitus ulcer, R and L ischial ulcers with dressings dated 01/09/16 without surrounding erythema, edema or discharge.      ED Treatments / Results  Labs (all labs ordered are listed, but only abnormal results are displayed) Labs Reviewed  CBC WITH DIFFERENTIAL/PLATELET - Abnormal; Notable for the following:       Result Value   WBC 12.6 (*)    RDW 16.2 (*)    Platelets 453 (*)    Neutro Abs 8.0 (*)    Eosinophils Absolute 1.2 (*)    All other components within normal limits  COMPREHENSIVE METABOLIC PANEL - Abnormal; Notable for the following:    Chloride 97 (*)    BUN 35 (*)    Creatinine, Ser 0.35 (*)    AST 48 (*)    Alkaline Phosphatase 174 (*)    All other components within normal limits  URINALYSIS, ROUTINE W REFLEX MICROSCOPIC - Abnormal; Notable for the following:    APPearance HAZY (*)    Hgb urine dipstick LARGE (*)    Leukocytes, UA LARGE (*)    Bacteria, UA RARE (*)    All other components within normal limits  URINE CULTURE  TROPONIN I  BRAIN NATRIURETIC PEPTIDE    EKG  EKG  Interpretation  Date/Time:  Wednesday January 09 2016 14:47:25 EST Ventricular Rate:  80 PR Interval:    QRS Duration: 85 QT Interval:  412 QTC Calculation: 476 R Axis:   65 Text Interpretation:  Atrial-paced rhythm ST elevation suggests acute pericarditis No significant change since last tracing Confirmed by FLOYD MD, DANIEL [phone]) on 01/09/2016 6:02:04 PM       Radiology Dg Chest 2 View  Result Date: 01/09/2016 CLINICAL DATA:  Shortness of breath.  History of asthma and smoking. EXAM: CHEST  2 VIEW COMPARISON:  Portable chest x-ray of November 23, 2015 FINDINGS: The lungs are well-expanded. The interstitial markings of both lungs are increased. There is patchy increased density at the right lung base posteriorly. The heart and pulmonary vascularity are normal. The tracheostomy appliance tip projects between the clavicular heads. The pacemaker electrodes are in reasonable position radiographically. IMPRESSION: Mild hyperinflation consistent with known reactive airway disease. Increased lung markings at both lung bases may reflect atelectasis or early pneumonia. There is pleural thickening versus pleural fluid at the right lung base. No evidence of CHF. Electronically Signed   By: David  Martinique M.D.   On: 01/09/2016 17:31    Procedures Procedures (including critical care time)  Medications Ordered in ED Medications - No data to display   Initial Impression / Assessment and Plan / ED Course  I have reviewed the triage vital signs and the nursing notes.  Pertinent labs & imaging results that were available during my care of the patient were reviewed by me and considered in my medical decision making (see chart for details).  Clinical Course as of Jan 09 2123  Wed Jan 09, 2016  1726 Reassessed pt.  Pt found asleep with o2 sats 89%.  Pt denied SOB.  O2 sats improved quickly to 93% while I was speaking to patient.   [CG]    Clinical Course User Index [CG] Kinnie Feil, PA-C    45 yo female w/  pmh as noted above presents with shortness of breath and decreased o2 sats 86-88% this morning.  Symptoms resolved en route to hospital, pt denied shortness of breath while in ED and her O2 sats remained wnl while in ED. Pt states she has been in her usual state of health she otherwise has no other complaints.  Pt's O2 sats only dropped once to 89% while in ED - at this time pt was sleeping but O2 sats quickly improved to 92-93% when pt woke up.  Pt denied shortness of breath during this episode.  On exam there are no signs of fluid overload, no JVD or lower extremity edema.  Lungs CTAB, no signs of consolidation.  Decubital ulcers with dressings in place changed today, without signs of cellulitis in surrounding skin. EKG remarkable for diffuse ST segment elevation suggesting pericarditis.  Per chart review, these diffuse ST segment elevations are not new, as they were also was also seen in last two EKGs on 11/23/15. Pt denies pleuritic chest pain.  There is no pericardial rub on exam. Doubt acute pericarditis.  CXR today was remarkable for increased lung marking at both lung bases ?atelectasis vs early PNA and ?pleural thickening vs pleural fluid at R lung base, per radiologist. U/A remarkable for large leukocytes, RBC and WBC too numerous to count and no squamous epithelial cells.  Pt denies urinary symptoms.  I do not think pt needs UTI abx.  CBC, CMP, troponin, BNP negative. At this point I doubt further ED work up and imaging is required.  Given uncertainty of CXR, improvement of symptoms and O2 sats in ED and otherwise grossly unremarkable labs, I think patient can be discharged with clindamycin and albuterol for questionable infiltrate on CXR.  Pt was excited to be going home as she states she is feeling 100% herself.  Pt given strict ED return instructions.  Pt advised to make appointment with PCP in the next 2-3 days for re-evaluation. Pt verbalized understanding and is agreeable to dispo  plan.   Final Clinical Impressions(s) / ED Diagnoses   Final diagnoses:  Shortness of breath    New Prescriptions Discharge Medication List as of 01/09/2016  8:33 PM    START taking these medications   Details  clindamycin (CLEOCIN) 150 MG capsule Take 3 capsules (450 mg total) by mouth every 6 (six) hours., Starting Wed 01/09/2016, Until Wed 01/16/2016, Print         Kinnie Feil, PA-C 01/09/16 2124    Gwenyth Allegra Tegeler, MD 01/10/16 1012

## 2016-01-09 NOTE — ED Triage Notes (Signed)
Pt states that she was at home and check her oxygen saturation and her O2 was at 79%. Pt stated that she did feel a little SOB by enroute to the hospital was relieved. Pt is sat at 95% on RA. Pt has a Therapist, nutritional

## 2016-01-09 NOTE — ED Notes (Signed)
Bed: [id number] Expected date:  Expected time:  Means of arrival:  Comments: EMS- quadriplegic/trach

## 2016-01-11 LAB — URINE CULTURE

## 2016-01-19 ENCOUNTER — Encounter (HOSPITAL_COMMUNITY): Payer: Self-pay | Admitting: Nurse Practitioner

## 2016-01-19 ENCOUNTER — Inpatient Hospital Stay (HOSPITAL_COMMUNITY)
Admission: EM | Admit: 2016-01-19 | Discharge: 2016-01-23 | DRG: 177 | Disposition: A | Discharge status: Home Health | Payer: Medicaid Other | Attending: Family Medicine | Admitting: Family Medicine

## 2016-01-19 ENCOUNTER — Emergency Department (HOSPITAL_COMMUNITY): Payer: Medicaid Other

## 2016-01-19 DIAGNOSIS — Z881 Allergy status to other antibiotic agents status: Secondary | ICD-10-CM | POA: Diagnosis not present

## 2016-01-19 DIAGNOSIS — Z87891 Personal history of nicotine dependence: Secondary | ICD-10-CM

## 2016-01-19 DIAGNOSIS — E440 Moderate protein-calorie malnutrition: Secondary | ICD-10-CM | POA: Diagnosis present

## 2016-01-19 DIAGNOSIS — Z681 Body mass index (BMI) 19 or less, adult: Secondary | ICD-10-CM

## 2016-01-19 DIAGNOSIS — J45909 Unspecified asthma, uncomplicated: Secondary | ICD-10-CM | POA: Diagnosis present

## 2016-01-19 DIAGNOSIS — Y95 Nosocomial condition: Secondary | ICD-10-CM | POA: Diagnosis present

## 2016-01-19 DIAGNOSIS — Z880 Allergy status to penicillin: Secondary | ICD-10-CM | POA: Diagnosis not present

## 2016-01-19 DIAGNOSIS — K219 Gastro-esophageal reflux disease without esophagitis: Secondary | ICD-10-CM | POA: Diagnosis present

## 2016-01-19 DIAGNOSIS — Z931 Gastrostomy status: Secondary | ICD-10-CM

## 2016-01-19 DIAGNOSIS — I495 Sick sinus syndrome: Secondary | ICD-10-CM | POA: Diagnosis present

## 2016-01-19 DIAGNOSIS — G8254 Quadriplegia, C5-C7 incomplete: Secondary | ICD-10-CM | POA: Diagnosis present

## 2016-01-19 DIAGNOSIS — J189 Pneumonia, unspecified organism: Secondary | ICD-10-CM | POA: Diagnosis not present

## 2016-01-19 DIAGNOSIS — T17990A Other foreign object in respiratory tract, part unspecified in causing asphyxiation, initial encounter: Secondary | ICD-10-CM | POA: Diagnosis present

## 2016-01-19 DIAGNOSIS — I5032 Chronic diastolic (congestive) heart failure: Secondary | ICD-10-CM

## 2016-01-19 DIAGNOSIS — Z950 Presence of cardiac pacemaker: Secondary | ICD-10-CM

## 2016-01-19 DIAGNOSIS — R1310 Dysphagia, unspecified: Secondary | ICD-10-CM | POA: Diagnosis present

## 2016-01-19 DIAGNOSIS — J690 Pneumonitis due to inhalation of food and vomit: Principal | ICD-10-CM | POA: Diagnosis present

## 2016-01-19 DIAGNOSIS — J9601 Acute respiratory failure with hypoxia: Secondary | ICD-10-CM | POA: Diagnosis present

## 2016-01-19 DIAGNOSIS — L89154 Pressure ulcer of sacral region, stage 4: Secondary | ICD-10-CM | POA: Diagnosis present

## 2016-01-19 DIAGNOSIS — Z930 Tracheostomy status: Secondary | ICD-10-CM | POA: Diagnosis not present

## 2016-01-19 DIAGNOSIS — Z981 Arthrodesis status: Secondary | ICD-10-CM

## 2016-01-19 DIAGNOSIS — X58XXXA Exposure to other specified factors, initial encounter: Secondary | ICD-10-CM | POA: Diagnosis present

## 2016-01-19 DIAGNOSIS — Z8249 Family history of ischemic heart disease and other diseases of the circulatory system: Secondary | ICD-10-CM

## 2016-01-19 DIAGNOSIS — J9811 Atelectasis: Secondary | ICD-10-CM | POA: Diagnosis present

## 2016-01-19 DIAGNOSIS — Z9119 Patient's noncompliance with other medical treatment and regimen: Secondary | ICD-10-CM

## 2016-01-19 DIAGNOSIS — Z87440 Personal history of urinary (tract) infections: Secondary | ICD-10-CM

## 2016-01-19 DIAGNOSIS — Z7401 Bed confinement status: Secondary | ICD-10-CM | POA: Diagnosis not present

## 2016-01-19 DIAGNOSIS — R1311 Dysphagia, oral phase: Secondary | ICD-10-CM | POA: Diagnosis not present

## 2016-01-19 DIAGNOSIS — R0602 Shortness of breath: Secondary | ICD-10-CM | POA: Diagnosis present

## 2016-01-19 DIAGNOSIS — Z79899 Other long term (current) drug therapy: Secondary | ICD-10-CM

## 2016-01-19 DIAGNOSIS — J4520 Mild intermittent asthma, uncomplicated: Secondary | ICD-10-CM | POA: Diagnosis not present

## 2016-01-19 HISTORY — DX: Pressure ulcer of sacral region, stage 4: L89.154

## 2016-01-19 HISTORY — DX: Quadriplegia, C5-C7 incomplete: G82.54

## 2016-01-19 HISTORY — DX: Unspecified protein-calorie malnutrition: E46

## 2016-01-19 LAB — CBC WITH DIFFERENTIAL/PLATELET
BASOS ABS: 0 10*3/uL (ref 0.0–0.1)
BASOS PCT: 0 %
EOS PCT: 5 %
Eosinophils Absolute: 0.4 10*3/uL (ref 0.0–0.7)
HCT: 40.5 % (ref 36.0–46.0)
Hemoglobin: 13.3 g/dL (ref 12.0–15.0)
LYMPHS PCT: 14 %
Lymphs Abs: 1 10*3/uL (ref 0.7–4.0)
MCH: 30.9 pg (ref 26.0–34.0)
MCHC: 32.8 g/dL (ref 30.0–36.0)
MCV: 94.2 fL (ref 78.0–100.0)
MONO ABS: 0.5 10*3/uL (ref 0.1–1.0)
Monocytes Relative: 7 %
Neutro Abs: 5.5 10*3/uL (ref 1.7–7.7)
Neutrophils Relative %: 74 %
PLATELETS: 243 10*3/uL (ref 150–400)
RBC: 4.3 MIL/uL (ref 3.87–5.11)
RDW: 16.5 % — AB (ref 11.5–15.5)
WBC: 7.4 10*3/uL (ref 4.0–10.5)

## 2016-01-19 LAB — COMPREHENSIVE METABOLIC PANEL
ALBUMIN: 3.9 g/dL (ref 3.5–5.0)
ALK PHOS: 205 U/L — AB (ref 38–126)
ALT: 85 U/L — ABNORMAL HIGH (ref 14–54)
ANION GAP: 10 (ref 5–15)
AST: 81 U/L — AB (ref 15–41)
BILIRUBIN TOTAL: 0.4 mg/dL (ref 0.3–1.2)
BUN: 26 mg/dL — AB (ref 6–20)
CALCIUM: 9.6 mg/dL (ref 8.9–10.3)
CO2: 28 mmol/L (ref 22–32)
Chloride: 95 mmol/L — ABNORMAL LOW (ref 101–111)
Creatinine, Ser: 0.35 mg/dL — ABNORMAL LOW (ref 0.44–1.00)
GFR calc Af Amer: >60 mL/min (ref >60)
GLUCOSE: 110 mg/dL — AB (ref 65–99)
POTASSIUM: 4.5 mmol/L (ref 3.5–5.1)
Sodium: 133 mmol/L — ABNORMAL LOW (ref 135–145)
TOTAL PROTEIN: 7.6 g/dL (ref 6.5–8.1)

## 2016-01-19 LAB — I-STAT TROPONIN, ED
TROPONIN I, POC: 0 ng/mL (ref 0.00–0.08)
Troponin i, poc: 0 ng/mL (ref 0.00–0.08)

## 2016-01-19 LAB — D-DIMER, QUANTITATIVE (NOT AT ARMC): D DIMER QUANT: 0.74 ug{FEU}/mL — AB (ref 0.00–0.50)

## 2016-01-19 LAB — I-STAT CG4 LACTIC ACID, ED: Lactic Acid, Venous: 0.97 mmol/L (ref 0.5–1.9)

## 2016-01-19 LAB — BRAIN NATRIURETIC PEPTIDE: B NATRIURETIC PEPTIDE 5: 27.4 pg/mL (ref 0.0–100.0)

## 2016-01-19 MED ORDER — OXYCODONE HCL 5 MG PO TABS
5.0000 mg | ORAL_TABLET | Freq: Four times a day (QID) | ORAL | Status: DC | PRN
  Administered 2016-01-20 – 2016-01-23 (×14): 5 mg
  Filled 2016-01-19 (×14): qty 1

## 2016-01-19 MED ORDER — POLYETHYLENE GLYCOL 3350 17 G PO PACK
17.0000 g | PACK | Freq: Every day | ORAL | Status: DC | PRN
Start: 2016-01-19 — End: 2016-01-23

## 2016-01-19 MED ORDER — DEXTROSE 5 % IV SOLN
2.0000 g | Freq: Three times a day (TID) | INTRAVENOUS | Status: DC
  Administered 2016-01-20: 05:00:00 2 g via INTRAVENOUS
  Filled 2016-01-19 (×2): qty 2

## 2016-01-19 MED ORDER — IOPAMIDOL (ISOVUE-370) INJECTION 76%
100.0000 mL | Freq: Once | INTRAVENOUS | Status: AC | PRN
Start: 2016-01-19 — End: 2016-01-19
  Administered 2016-01-19: 100 mL via INTRAVENOUS

## 2016-01-19 MED ORDER — IOPAMIDOL (ISOVUE-370) INJECTION 76%
INTRAVENOUS | Status: AC
  Filled 2016-01-19: qty 100

## 2016-01-19 MED ORDER — ALBUTEROL SULFATE (2.5 MG/3ML) 0.083% IN NEBU
2.5000 mg | INHALATION_SOLUTION | RESPIRATORY_TRACT | Status: DC | PRN
  Administered 2016-01-23: 12:00:00 2.5 mg via RESPIRATORY_TRACT
  Filled 2016-01-19: qty 3

## 2016-01-19 MED ORDER — IPRATROPIUM-ALBUTEROL 0.5-2.5 (3) MG/3ML IN SOLN
3.0000 mL | Freq: Four times a day (QID) | RESPIRATORY_TRACT | Status: DC
  Administered 2016-01-20: 3 mL via RESPIRATORY_TRACT
  Filled 2016-01-19: qty 3

## 2016-01-19 MED ORDER — GABAPENTIN 400 MG PO CAPS
400.0000 mg | ORAL_CAPSULE | Freq: Four times a day (QID) | ORAL | Status: DC
  Administered 2016-01-20 – 2016-01-23 (×13): 400 mg
  Filled 2016-01-19 (×13): qty 1

## 2016-01-19 MED ORDER — GUAIFENESIN ER 600 MG PO TB12
600.0000 mg | ORAL_TABLET | Freq: Two times a day (BID) | ORAL | Status: DC
  Filled 2016-01-19: qty 1

## 2016-01-19 MED ORDER — BACLOFEN 20 MG PO TABS
20.0000 mg | ORAL_TABLET | Freq: Three times a day (TID) | ORAL | Status: DC
  Administered 2016-01-20 – 2016-01-23 (×12): 20 mg
  Filled 2016-01-19 (×12): qty 1

## 2016-01-19 MED ORDER — DULOXETINE HCL 30 MG PO CPEP
30.0000 mg | ORAL_CAPSULE | Freq: Two times a day (BID) | ORAL | Status: DC
  Administered 2016-01-20 – 2016-01-23 (×8): 30 mg via ORAL
  Filled 2016-01-19 (×8): qty 1

## 2016-01-19 MED ORDER — CLONAZEPAM 0.5 MG PO TBDP
0.5000 mg | ORAL_TABLET | Freq: Every day | ORAL | Status: DC
  Administered 2016-01-20: 0.5 mg
  Filled 2016-01-19: qty 1

## 2016-01-19 MED ORDER — CLONAZEPAM 0.5 MG PO TABS
0.5000 mg | ORAL_TABLET | Freq: Once | ORAL | Status: AC
  Administered 2016-01-19: 0.5 mg via ORAL
  Filled 2016-01-19: qty 1

## 2016-01-19 MED ORDER — RANITIDINE HCL 150 MG/10ML PO SYRP
150.0000 mg | ORAL_SOLUTION | Freq: Two times a day (BID) | ORAL | Status: DC | PRN
  Filled 2016-01-19: qty 10

## 2016-01-19 MED ORDER — LEVOFLOXACIN IN D5W 750 MG/150ML IV SOLN
750.0000 mg | Freq: Once | INTRAVENOUS | Status: AC
  Administered 2016-01-19: 750 mg via INTRAVENOUS
  Filled 2016-01-19: qty 150

## 2016-01-19 MED ORDER — SODIUM CHLORIDE 0.9 % IV SOLN
INTRAVENOUS | Status: DC
  Administered 2016-01-20 – 2016-01-22 (×4): via INTRAVENOUS

## 2016-01-19 MED ORDER — VANCOMYCIN HCL IN DEXTROSE 1-5 GM/200ML-% IV SOLN
1000.0000 mg | Freq: Once | INTRAVENOUS | Status: AC
  Administered 2016-01-19: 1000 mg via INTRAVENOUS
  Filled 2016-01-19: qty 200

## 2016-01-19 MED ORDER — TIZANIDINE HCL 4 MG PO TABS
4.0000 mg | ORAL_TABLET | Freq: Three times a day (TID) | ORAL | Status: DC
  Administered 2016-01-20 – 2016-01-23 (×12): 4 mg
  Filled 2016-01-19 (×12): qty 1

## 2016-01-19 MED ORDER — ENOXAPARIN SODIUM 40 MG/0.4ML ~~LOC~~ SOLN
40.0000 mg | Freq: Every day | SUBCUTANEOUS | Status: DC
Start: 2016-01-19 — End: 2016-01-20
  Administered 2016-01-20: 40 mg via SUBCUTANEOUS
  Filled 2016-01-19: qty 0.4

## 2016-01-19 MED ORDER — OXYCODONE-ACETAMINOPHEN 5-325 MG PO TABS
1.0000 | ORAL_TABLET | Freq: Once | ORAL | Status: AC
  Administered 2016-01-19: 1 via ORAL
  Filled 2016-01-19: qty 1

## 2016-01-19 MED ORDER — VANCOMYCIN HCL 500 MG IV SOLR
500.0000 mg | Freq: Two times a day (BID) | INTRAVENOUS | Status: DC
  Administered 2016-01-20 – 2016-01-22 (×5): 500 mg via INTRAVENOUS
  Filled 2016-01-19 (×6): qty 500

## 2016-01-19 MED ORDER — MIDODRINE HCL 5 MG PO TABS
10.0000 mg | ORAL_TABLET | Freq: Three times a day (TID) | ORAL | Status: DC
  Administered 2016-01-20 – 2016-01-23 (×12): 10 mg
  Filled 2016-01-19 (×12): qty 2

## 2016-01-19 NOTE — ED Notes (Signed)
Pt transported to Xray. 

## 2016-01-19 NOTE — ED Provider Notes (Signed)
  Physical Exam  BP 102/67   Pulse 80   Temp 98.1 F (36.7 C)   Resp 17   LMP  (LMP Unknown)   SpO2 (!) 89%   Physical Exam  ED Course  Procedures  MDM Care assumed at sign out. Patient is quadriplegic and recently admitted for infected stage 4 ulcer. She was seen a week ago for SOB and prescribed clinda for possible aspiration pneumonia. Has persistent SOB. In the ED, she desat to 89% on RA while sleeping. Patient is not running fevers at home. Sign out pending D-dimer. D-dimer positive. CT angio showed new R upper lobe infiltrate. Given vanc/levaquin. Added on lactate, cultures. Hospitalist to admit for hypoxia, HCAP, failure of outpatient abx     Drenda Freeze, MD 01/19/16 2133

## 2016-01-19 NOTE — ED Notes (Signed)
Per MD need results for BNP and d-dimer.  Contacted Ovid Curd in lab he advised that tubes received and will run ASAP.

## 2016-01-19 NOTE — Progress Notes (Signed)
6E nurse called ED nurse to clarify two separate cardiac monitoring orders. One cardiac monitoring order was ED cardiac monitoring order entered in the 1500 hour today. Second cardiac monitoring order, entered in 2200 hour, had information in comment field to contact MD in 48 hours if patient did not have any cardiac events. Per Maylon Cos, nurse in ED, Dr. Roel Cluck does not want patient on cardiac monitoring. Patient to be transported to [facility].

## 2016-01-19 NOTE — ED Notes (Signed)
Pt states that she is experiencing some anxiety and is requesting her Klonopin. MD made aware.

## 2016-01-19 NOTE — Progress Notes (Signed)
Pharmacy Antibiotic Note  Angela Page is a 45 y.o. female admitted on 01/19/2016 with pneumonia/sepsis due to UTI.  PMH significant for traumatic spinal injury with paraplegia, recurrent UTIs, decubitus ulcers, tracheotomy.  In the ED, patient received Vancomycin 1gm and Levofloxacin 750mg  IV x 1 dose each.  Pharmacy has been consulted for Vancomycin dosing.  Plan:  Vancomycin 500mg  IV q12h  Vancomycin trough goal: 15-20 mcg/ml  Aztreonam 2gm IV q8h (per MD dosing)  F/U cultures, renal function, clinical course  Weight: 95 lb 0.3 oz (43.1 kg)  Temp (24hrs), Avg:98.1 F (36.7 C), Min:98.1 F (36.7 C), Max:98.1 F (36.7 C)   Recent Labs Lab 01/19/16 1535 01/19/16 2301  WBC 7.4  --   CREATININE 0.35*  --   LATICACIDVEN  --  0.97    Estimated Creatinine Clearance: 60.4 mL/min (by C-G formula based on SCr of 0.35 mg/dL (L)).    Allergies  Allergen Reactions  . Erythromycin Nausea And Vomiting  . Nitrofurantoin Monohyd Macro Nausea And Vomiting  . Penicillins Hives    Has patient had a PCN reaction causing immediate rash, facial/tongue/throat swelling, SOB or lightheadedness with hypotension: Yes Has patient had a PCN reaction causing severe rash involving mucus membranes or skin necrosis: Yes Has patient had a PCN reaction that required hospitalization Yes- went to the DR Has patient had a PCN reaction occurring within the last 10 years: No-childhood allergy If all of the above answers are "NO", then may proceed with Cephalosporin use.     Antimicrobials this admission: 12/30 Levofloxacin x 1 12/30 Vanc >>   12/30 Aztreonam >>  Dose adjustments this admission:    Microbiology results: 12/30 BCx: sent 12/30 Trach Cx: sent   Thank you for allowing pharmacy to be a part of this patient's care.  Everette Rank, PharmD 01/19/2016 11:29 PM

## 2016-01-19 NOTE — H&P (Signed)
Angela Page WER:[id number] DOB: 1970-07-07 DOA: 01/19/2016     PCP: Sherrell Puller at [facility]: none   Patient coming from:  home Lives  With family    Chief Complaint: dyspnea  HPI: Angela Page is a 45 y.o. 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   Patient continues to chain smoke she has had numerous admissions in the past including 1 months ago. For pneumonia. She had a visit to emergency department on December 20 for shortness of breath with oxygen saturation down to 86% she was diagnosed with questionable pneumonia treated with clindamycin and albuterol when necessary and discharged to home.  At home she's been using oxygen from her mom says she is herself does not have oxygen prescription. While in oxygen her oxygen saturation improved to 97%. Regarding pertinent Chronic problems: Patient has been paraplegic since cervical spine injury in 2015 she has chronic indwelling Foley catheter status post acute ostomy. Patient has history of asthma but continues to smoke. She has chronic sacral decubitus ulcer and has been seen by care for this. Patient was recently hospitalized at [facility] for sepsis and acute hypoxic respiratory failure secondary to pneumonia and UTI, which required intubation and ICU admission. She failed extubation and had to be trached on 10/23. She currently has a trach, PEG and indwelling Foley.   IN ER:  Temp (24hrs), Avg:98.1 F (36.7 C), Min:98.1 F (36.7 C), Max:98.1 F (36.7 C)   Noted to be hypoxic and intermediate 80s when she is sleeping while in emergency department Suggestions  saturation improving when she woke up. RR 17, HR 80  BP 102/67 Trop 0.00 Na 133 Cr 0.35 elevated LFT AST 81 ALT  85  Alk Phos 205 WBC 7.4 Hg 13.3 D.dimer 0.74  CTA chest no PE right upper lobe pneumonia noted. With occlusion of right lower lobe bronchus possibly secondary to mucous plugging  Following Medications were ordered in ER: Medications  iopamidol (ISOVUE-370) 76 % injection (not administered)  levofloxacin (LEVAQUIN) IVPB 750 mg (750 mg Intravenous New Bag/Given 01/19/16 2057)  vancomycin (VANCOCIN) IVPB 1000 mg/200 mL premix (not administered)  oxyCODONE-acetaminophen (PERCOCET/ROXICET) 5-325 MG per tablet 1 tablet (1 tablet Oral Given 01/19/16 1609)  clonazePAM (KLONOPIN) tablet 0.5 mg (0.5 mg Oral Given 01/19/16 1903)  iopamidol (ISOVUE-370) 76 % injection 100 mL (100 mLs Intravenous Contrast Given 01/19/16 2001)      Hospitalist was called for admission for Acute respiratory failure with Hypoxia  Review of Systems:    Pertinent positives include:  shortness of breath at rest.  Constitutional:  No weight loss, night sweats, Fevers, chills, fatigue, weight loss  HEENT:  No headaches, Difficulty swallowing,Tooth/dental problems,Sore throat,  No sneezing, itching, ear ache, nasal congestion, post nasal drip,  Cardio-vascular:  No chest pain, Orthopnea, PND, anasarca, dizziness, palpitations.no Bilateral lower extremity swelling  GI:  No heartburn, indigestion, abdominal pain, nausea, vomiting, diarrhea, change in bowel habits, loss of appetite, melena, blood in stool, hematemesis Resp:  no No dyspnea on exertion, No excess mucus, no productive cough, No non-productive cough, No coughing up of blood.No change in color of mucus.No wheezing. Skin:  no rash or lesions. No jaundice GU:  no dysuria, change in color of urine, no urgency or frequency. No straining to urinate.  No flank pain.  Musculoskeletal:  No joint pain or no joint swelling. No decreased range of  motion. No back pain.  Psych:  No change in mood or affect. No depression or anxiety. No memory loss.  Neuro: no localizing neurological complaints, no tingling, no weakness, no double vision, no gait abnormality, no slurred speech, no confusion  As per HPI otherwise 10 point review of systems negative.   Past Medical History: Past Medical History:  Diagnosis Date  . Asthma   . GERD (gastroesophageal reflux disease)   . Pacemaker   . Pleurisy   . Protein calorie malnutrition ([place]) 10/31/2015  . Quadriparesis ([place])   . Quadriplegia, C5-C7 incomplete ([place]) 10/08/2013  . Stage IV pressure ulcer of sacral region ([place]) 10/31/2015   Past Surgical History:  Procedure Laterality Date  . APPENDECTOMY    . BACK SURGERY  10/08/2013   fusion of spine, cervical region  . CARDIAC SURGERY    . GASTROSTOMY  11/23/2015  . I&D EXTREMITY  10/28/2011   Procedure: IRRIGATION AND DEBRIDEMENT EXTREMITY;  Surgeon: Tennis Must, MD;  Location: [facility];  Service: Orthopedics;  Laterality: Right;  Irrigation and debridement right middle finger  . IR GENERIC HISTORICAL  11/23/2015   IR GASTROSTOMY TUBE MOD SED 11/23/2015 [facility]-INTERV RAD  . TRACHEOSTOMY       Social History:  Ambulatory   bed bound since 2015    reports that she has quit smoking. Her smoking use included Cigarettes. She has a 25.00 pack-year smoking history. She has never used smokeless tobacco. She reports that she drinks alcohol. She reports that she does not use drugs.  Allergies:   Allergies  Allergen Reactions  . Erythromycin Nausea And Vomiting  . Nitrofurantoin Monohyd Macro Nausea And Vomiting  . Penicillins Hives    Has patient had a PCN reaction causing immediate rash, facial/tongue/throat swelling, SOB or lightheadedness with hypotension: Yes Has patient had a PCN reaction causing severe rash involving mucus membranes or skin necrosis: Yes Has patient had a PCN reaction that required hospitalization Yes-  went to the DR Has patient had a PCN reaction occurring within the last 10 years: No-childhood allergy If all of the above answers are "NO", then may proceed with Cephalosporin use.        Family History:   Family History  Problem Relation Age of Onset  . Hypertension Maternal Uncle     Medications: Prior to Admission medications   Medication Sig Start Date End Date Taking? Authorizing Provider  acetaminophen (TYLENOL) 500 MG tablet Place 1 tablet (500 mg total) into feeding tube every 6 (six) hours as needed for moderate pain. 11/26/15  Yes Florencia Reasons, MD  albuterol (PROVENTIL HFA;VENTOLIN HFA) 108 (90 BASE) MCG/ACT inhaler Inhale 2 puffs into the lungs every 6 (six) hours as needed. For shortness of breath    Yes Historical Provider, MD  albuterol (PROVENTIL) (2.5 MG/3ML) 0.083% nebulizer solution Take 2.5 mg by nebulization every 6 (six) hours as needed for wheezing or shortness of breath.   Yes Historical Provider, MD  baclofen (LIORESAL) 20 MG tablet Take 20 mg by mouth 3 (three) times daily. 12/14/15  Yes Historical Provider, MD  clonazePAM (KLONOPIN) 0.5 MG disintegrating tablet Place 1 tablet (0.5 mg total) into feeding tube at bedtime. 11/26/15  Yes Florencia Reasons, MD  collagenase (SANTYL) ointment Apply topically daily. Patient taking differently: Apply 1 application  topically daily as needed (wound care).  11/26/15  Yes Florencia Reasons, MD  [employer] VITAMIN B12 1000 MCG tablet Place 1 tablet (1,000 mcg total) into feeding tube daily. 11/26/15  Yes Florencia Reasons, MD  doxycycline (VIBRA-TABS) 100 MG tablet Take 100 mg by mouth every 12 (twelve) hours. 11/30/15  Yes Historical Provider, MD  DULoxetine (CYMBALTA) 30 MG capsule Take 30 mg by mouth 2 (two) times daily. 11/30/15  Yes Historical Provider, MD  gabapentin (NEURONTIN) 400 MG capsule Take 400 mg by mouth 4 (four) times daily. 10/16/15  Yes Historical Provider, MD  ibuprofen (ADVIL,MOTRIN) 200 MG tablet Place 2 tablets (400 mg total) into feeding tube  every 6 (six) hours as needed for moderate pain. 11/26/15  Yes Florencia Reasons, MD  lidocaine (LIDODERM) 5 % Place 1 patch onto the skin 2 (two) times daily as needed (pain).  04/04/15  Yes Historical Provider, MD  midodrine (PROAMATINE) 10 MG tablet Place 1 tablet (10 mg total) into feeding tube 3 (three) times daily with meals. 11/26/15  Yes Florencia Reasons, MD  neomycin-bacitracin-polymyxin (NEOSPORIN) ointment Apply 1 application topically daily. apply to eye Patient taking differently: Apply 1 application topically daily as needed for wound care. apply to eye 11/26/15  Yes Florencia Reasons, MD  oxyCODONE (OXY IR/ROXICODONE) 5 MG immediate release tablet Place 1 tablet (5 mg total) into feeding tube every 6 (six) hours as needed for moderate pain or severe pain. 11/26/15  Yes Florencia Reasons, MD  polyethylene glycol (MIRALAX / GLYCOLAX) packet Place 17 g into feeding tube daily. Patient taking differently: Place 17 g into feeding tube daily as needed for moderate constipation.  11/27/15  Yes Florencia Reasons, MD  ranitidine (ZANTAC) 150 MG/10ML syrup Place 10 mLs (150 mg total) into feeding tube every 12 (twelve) hours. Patient taking differently: Place 150 mg into feeding tube 2 (two) times daily as needed for heartburn.  11/26/15  Yes Florencia Reasons, MD  silver sulfADIAZINE (SILVADENE) 1 % cream Apply 1 application topically every other day as needed (for wound care).   Yes Historical Provider, MD  tiZANidine (ZANAFLEX) 4 MG tablet Take 4 mg by mouth 3 (three) times daily. 11/30/15  Yes Historical Provider, MD  zinc oxide (BALMEX) 11.3 % CREA cream Apply 1 application topically 3 (three) times daily as needed (for skin redness/irritation).   Yes Historical Provider, MD  Amino Acids-Protein Hydrolys (FEEDING SUPPLEMENT, PRO-STAT SUGAR FREE 64,) LIQD Place 30 mLs into feeding tube 2 (two) times daily. 11/26/15   Florencia Reasons, MD  baclofen (LIORESAL) 10 MG tablet Place 1 tablet (10 mg total) into feeding tube 3 (three) times daily. Patient not taking:  Reported on 01/09/2016 11/26/15   Florencia Reasons, MD  bisacodyl (DULCOLAX) 10 MG suppository Place 1 suppository (10 mg total) rectally daily as needed for moderate constipation. Patient not taking: Reported on 01/09/2016 11/26/15   Florencia Reasons, MD  gabapentin (NEURONTIN) 250 MG/5ML solution Place 6 mLs (300 mg total) into feeding tube 3 (three) times daily. Patient not taking: Reported on 01/09/2016 11/26/15   Florencia Reasons, MD  Melatonin 3 MG TABS 2 tablets (6 mg total) by PEG Tube route at bedtime as needed (for sleep). Patient not taking: Reported on 01/09/2016 11/26/15   Florencia Reasons, MD  Nutritional Supplements (FEEDING SUPPLEMENT, OSMOLITE 1.5 CAL,) LIQD Place 1,000 mLs into feeding tube daily. 11/27/15   Florencia Reasons, MD  sennosides (SENOKOT) 8.8 MG/5ML syrup Place 5 mLs into feeding tube at bedtime as needed for mild constipation or moderate  constipation. Patient not taking: Reported on 01/09/2016 11/26/15   Florencia Reasons, MD  venlafaxine [facility]) 75 MG tablet Place 1 tablet (75 mg total) into feeding tube 2 (two) times daily. Patient not taking: Reported on 01/09/2016 11/26/15   Florencia Reasons, MD  Water For Irrigation, Sterile (FREE WATER) SOLN Place 100 mLs into feeding tube every 8 (eight) hours. 11/26/15   Florencia Reasons, MD  Water For Irrigation, Sterile (FREE WATER) SOLN Place 100 mLs into feeding tube every 8 (eight) hours. Patient not taking: Reported on 01/09/2016 11/27/15   Florencia Reasons, MD    Physical Exam: Patient Vitals for the past 24 hrs:  BP Temp Temp src Pulse Resp SpO2  01/19/16 2100 102/67 - - 80 17 (!) 89 %  01/19/16 1917 100/68 - - 79 19 94 %  01/19/16 1716 100/62 - - 80 18 92 %  01/19/16 1509 - 98.1 F (36.7 C) - 80 16 95 %  01/19/16 1449 - (P) 98.1 F (36.7 C) (P) Oral - (P) 16 -    1. General:  in No Acute distress 2. Psychological: Alert and Oriented 3. Head/ENT:     Dry Mucous Membranes                          Head Non traumatic, neck supple                            Poor Dentition 4. SKIN:   decreased  Skin turgor,  Skin clean Dry  Sacral dressing noted 5. Heart: Regular rate and rhythm no Murmur, Rub or gallop 6. Lungs: diminished  no wheezes or crackles   7. Abdomen: Soft,  non-tender, Non distended 8. Lower extremities: no clubbing, cyanosis, or edema 9. Neurologically Quadroplegic 10. MSK: Normal range of motion   body mass index is unknown because there is no height or weight on file.  Labs on Admission:   Labs on Admission: I have personally reviewed following labs and imaging studies  CBC:  Recent Labs Lab 01/19/16 1535  WBC 7.4  NEUTROABS 5.5  HGB 13.3  HCT 40.5  MCV 94.2  PLT 629   Basic Metabolic Panel:  Recent Labs Lab 01/19/16 1535  NA 133*  K 4.5  CL 95*  CO2 28  GLUCOSE 110*  BUN 26*  CREATININE 0.35*  CALCIUM 9.6   GFR: CrCl cannot be calculated (Unknown ideal weight.). Liver Function Tests:  Recent Labs Lab 01/19/16 1535  AST 81*  ALT 85*  ALKPHOS 205*  BILITOT 0.4  PROT 7.6  ALBUMIN 3.9   No results for input(s): LIPASE, AMYLASE in the last 168 hours. No results for input(s): AMMONIA in the last 168 hours. Coagulation Profile: No results for input(s): INR, PROTIME in the last 168 hours. Cardiac Enzymes: No results for input(s): CKTOTAL, CKMB, CKMBINDEX, TROPONINI in the last 168 hours. BNP (last 3 results) No results for input(s): PROBNP in the last 8760 hours. HbA1C: No results for input(s): HGBA1C in the last 72 hours. CBG: No results for input(s): GLUCAP in the last 168 hours. Lipid Profile: No results for input(s): CHOL, HDL, LDLCALC, TRIG, CHOLHDL, LDLDIRECT in the last 72 hours. Thyroid Function Tests: No results for input(s): TSH, T4TOTAL, FREET4, T3FREE, THYROIDAB in the last 72 hours. Anemia Panel: No results for input(s): VITAMINB12, FOLATE, FERRITIN, TIBC, IRON, RETICCTPCT in the last 72 hours. Urine analysis:    Component Value Date/Time  COLORURINE YELLOW 01/09/2016 1731   APPEARANCEUR HAZY (A) 01/09/2016  1731   LABSPEC 1.009 01/09/2016 1731   PHURINE 6.0 01/09/2016 1731   GLUCOSEU NEGATIVE 01/09/2016 1731   HGBUR LARGE (A) 01/09/2016 1731   BILIRUBINUR NEGATIVE 01/09/2016 1731   KETONESUR NEGATIVE 01/09/2016 1731   PROTEINUR NEGATIVE 01/09/2016 1731   UROBILINOGEN 0.2 10/29/2014 1521   NITRITE NEGATIVE 01/09/2016 1731   LEUKOCYTESUR LARGE (A) 01/09/2016 1731   Sepsis Labs: '@LABRCNTIP'$ (procalcitonin:4,lacticidven:4) )No results found for this or any previous visit (from the past 240 hour(s)).      UA no evidence of UTI    No results found for: HGBA1C  CrCl cannot be calculated (Unknown ideal weight.).  BNP (last 3 results) No results for input(s): PROBNP in the last 8760 hours.   ECG REPORT  Independently reviewed Rate: 83  Rhythm: paced ST&T Change: No acute ischemic changes   QTC 461 There were no vitals filed for this visit.   Cultures:    Component Value Date/Time   SDES URINE, RANDOM 01/09/2016 1731   SPECREQUEST NONE 01/09/2016 1731   CULT MULTIPLE SPECIES PRESENT, SUGGEST RECOLLECTION (A) 01/09/2016 1731   REPTSTATUS 01/11/2016 FINAL 01/09/2016 1731     Radiological Exams on Admission: Dg Chest 2 View  Result Date: 01/19/2016 CLINICAL DATA:  Worsening O2 saturation this morning. EXAM: CHEST  2 VIEW COMPARISON:  January 09, 2016 FINDINGS: The heart size and mediastinal contours are stable. Tracheostomy tube is unchanged. There is right pleural effusion with consolidation right lung base unchanged. There is mild interstitial edema. The visualized skeletal structures are stable. IMPRESSION: Persistent right pleural effusion with consolidation right lung base unchanged. Mild interstitial edema. Electronically Signed   By: Abelardo Diesel M.D.   On: 01/19/2016 16:32   Ct Angio Chest Pe W And/or Wo Contrast  Result Date: 01/19/2016 CLINICAL DATA:  Shortness of breath. EXAM: CT ANGIOGRAPHY CHEST WITH CONTRAST TECHNIQUE: Multidetector CT imaging of the chest was  performed using the standard protocol during bolus administration of intravenous contrast. Multiplanar CT image reconstructions and MIPs were obtained to evaluate the vascular anatomy. CONTRAST:  100 mL of Isovue 370 intravenously. COMPARISON:  Radiographs of same day.  CT scan of November 01, 2015. FINDINGS: Cardiovascular: There is no evidence of thoracic aortic dissection or aneurysm. There is no definite evidence of pulmonary embolus. Mediastinum/Nodes: Tracheostomy tube is in grossly good position. Left-sided pacemaker is unchanged in position. 1.2 cm precarinal lymph node is noted. Thyroid gland is unremarkable. Lungs/Pleura: No pneumothorax is noted. Right lower lobe atelectasis or infiltrate is noted, which appears to be due to bronchial occlusion or mucous plugging. Right upper lobe airspace opacity is noted concerning for pneumonia. Upper Abdomen: No acute abnormality. Musculoskeletal: No chest wall abnormality. No acute or significant osseous findings. Review of the MIP images confirms the above findings. IMPRESSION: No definite evidence of pulmonary embolus. Tracheostomy tube in grossly good position. 1.2 cm precarinal lymph node is noted which is enlarged compared to prior exam. It is uncertain if this is inflammatory or neoplastic in origin; clinical correlation is recommended. Right upper lobe airspace opacity is noted concerning for pneumonia. Right lower lobe atelectasis or infiltrate is noted, which appears to be due to occlusion of lower lobe bronchus, potentially due to mucous plugging. Bronchoscopy is recommended for further evaluation. Electronically Signed   By: Marijo Conception, M.D.   On: 01/19/2016 20:39    Chart has been reviewed    Assessment/Plan  45 y.o. 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 Acute respiratory failure with hypoxia - likely multifactorial combination of new diagnosis of pneumonia versus atelectasis secondary to bronchial occlusion secondary to mucous plugging. Will order chest therapy will need to have home oxygen as needed. Strongly encouraged patient to stop smoking discussed at length the patient cannot smoke while using oxygen. Given mucous plugging will have chest PT ordered. Repeat imaging to evaluate for improvement of atelectasis next 24 hours if despite aggressive chest PT no improvement in the patient continues to remain hypoxic consider pulmonology consult . Asthma does not appear to have any wheezing we will continue when necessary albuterol and add DuoNeb . Decubitus ulcer of sacral region, stage 4 ([place]) order wound consult  . Malnutrition of moderate degree- order prealbumin,nutritionalconsult . HCAP (healthcare-associated pneumonia) - admit for broad coverage antibiotic coverage to evaluate for aspiration Likely need to be discharged home with home oxygen when necessary  Other plan as per orders.  DVT prophylaxis:    Lovenox     Code Status:  FULL CODE  as per patient    Family Communication:   Family not at  Bedside    Disposition Plan:     To home once workup is complete and patient is stable                                              Consults called: Case discussed with pulmonology recommended chest PT therapy if no improvement in mucous plugging may need pulmonology consult.  Admission status:   Inpatient    Level of care   Med surge        I have spent a total of 56 min on this admission   extra time was spent to discuss case with pulmonology  [place] 01/19/2016, 10:37 PM    [facility]  Pager [phone]   after 2 AM please page  floor coverage PA If 7AM-7PM, please contact the day team taking care of the patient  [email or web address]  Password [email or web address]

## 2016-01-19 NOTE — ED Notes (Addendum)
Unable to obtain 2nd set of BC, phlebotomy stuck x2. This RN attempted x1.

## 2016-01-19 NOTE — ED Triage Notes (Signed)
Pt presents to [facility] from home. She was seen in this ED recently and told she had a suspect pneumonia. Since then she has noticed worsening SpO2 in the morning in the 80s and has required suctioning by her home health nurses. She has a hx of quadriplegia and is unable to cough the secretions out. She has not noticed fever. Upon arrival her SpO2 is 96% on room air.

## 2016-01-19 NOTE — ED Notes (Signed)
Call pt mother at [phone] with whether or not pt will be discharged.

## 2016-01-19 NOTE — ED Notes (Signed)
Bed: [id number] Expected date:  Expected time:  Means of arrival:  Comments: Resp distress

## 2016-01-19 NOTE — ED Notes (Signed)
Mother updated about pt disposition

## 2016-01-19 NOTE — ED Provider Notes (Signed)
[facility] DEPT Provider Note   CSN: [id number] Arrival date & time: 01/19/16  1440     History   Chief Complaint Chief Complaint  Patient presents with  . Pneumonia  . Shortness of Breath    HPI Angela Page is a 45 y.o. female.  The history is provided by the patient. No language interpreter was used.  Pneumonia  Associated symptoms include shortness of breath.  Shortness of Breath     Angela Page is a 45 y.o. female who presents to the Emergency Department complaining of SOB.  She presents for evaluation of shortness of breath. She reports 1-1/2 weeks of increased shortness of breath and feeling as if she can't get enough air. She has increasing secretions from her tracheostomy per home health. She endorses some right-sided chest wall pain as well. She does not have a significant cough. She does have some bilateral lower extremity edema. No reports of fevers at home. She doesn't worse overall malaise. She has a history of C5 spinal cord injury with quadriparesis. She is on humidified oxygen through a tracheostomy. She was seen in the emergency department a week ago for similar symptoms and was discharged home on [place] but endorses feeling worse at this time.  Past Medical History:  Diagnosis Date  . Asthma   . GERD (gastroesophageal reflux disease)   . Pacemaker   . Pleurisy   . Protein calorie malnutrition ([place]) 10/31/2015  . Quadriparesis ([place])   . Quadriplegia, C5-C7 incomplete ([place]) 10/08/2013  . Stage IV pressure ulcer of sacral region [facility]) 10/31/2015    Patient Active Problem List   Diagnosis Date Noted  . Tracheostomy status ([place])   . Palliative care encounter   . Goals of care, counseling/discussion   . Esophageal dysphagia   . Shortness of breath   . Acute respiratory failure with hypoxemia ([place])   . Encounter for intubation   . Malnutrition of moderate degree 11/01/2015  . Acute encephalopathy 10/31/2015  . Tobacco abuse 10/31/2015  .  Asthma 10/31/2015  . Pressure ulcer 02/26/2015  . HCAP (healthcare-associated pneumonia) 02/25/2015  . Lower urinary tract infectious disease 02/25/2015  . Hypotension 02/25/2015  . Anemia 02/25/2015  . Enterococcus UTI 02/03/2015  . Sepsis ([place]) 01/31/2015  . RLL pneumonia ([place]) 01/31/2015  . Altered mental status 10/29/2014  . Encephalopathy, toxic 10/29/2014  . Toxic metabolic encephalopathy [id number]  . Urinary tract infection due to Proteus 10/24/2014  . Leukocytosis 10/23/2014  . Hypokalemia 10/23/2014  . Decubitus ulcer of sacral region, stage 4 ([place]) 10/23/2014  . Anemia, iron deficiency 10/23/2014  . Severe sepsis ([place])   . Septic shock ([place]) 10/22/2014  . Quadriplegia, C5-C7 incomplete ([place]) 10/22/2014  . Depression   . Protein-calorie malnutrition, severe ([place]) 09/05/2014  . Overdose of opiate or related narcotic 09/02/2014    Past Surgical History:  Procedure Laterality Date  . APPENDECTOMY    . BACK SURGERY  10/08/2013   fusion of spine, cervical region  . CARDIAC SURGERY    . GASTROSTOMY  11/23/2015  . I&D EXTREMITY  10/28/2011   Procedure: IRRIGATION AND DEBRIDEMENT EXTREMITY;  Surgeon: Tennis Must, MD;  Location: [facility];  Service: Orthopedics;  Laterality: Right;  Irrigation and debridement right middle finger  . IR GENERIC HISTORICAL  11/23/2015   IR GASTROSTOMY TUBE MOD SED 11/23/2015 [facility]-INTERV RAD  . TRACHEOSTOMY      OB History    No data available       Home Medications  Prior to Admission medications   Medication Sig Start Date End Date Taking? Authorizing Provider  acetaminophen (TYLENOL) 500 MG tablet Place 1 tablet (500 mg total) into feeding tube every 6 (six) hours as needed for moderate pain. 11/26/15   Florencia Reasons, MD  albuterol (PROVENTIL HFA;VENTOLIN HFA) 108 (90 BASE) MCG/ACT inhaler Inhale 2 puffs into the lungs every 6 (six) hours as needed. For shortness of breath     Historical Provider, MD  albuterol (PROVENTIL)  (2.5 MG/3ML) 0.083% nebulizer solution Take 2.5 mg by nebulization every 6 (six) hours as needed for wheezing or shortness of breath.    Historical Provider, MD  Amino Acids-Protein Hydrolys (FEEDING SUPPLEMENT, PRO-STAT SUGAR FREE 64,) LIQD Place 30 mLs into feeding tube 2 (two) times daily. 11/26/15   Florencia Reasons, MD  baclofen (LIORESAL) 10 MG tablet Place 1 tablet (10 mg total) into feeding tube 3 (three) times daily. Patient not taking: Reported on 01/09/2016 11/26/15   Florencia Reasons, MD  baclofen (LIORESAL) 20 MG tablet Take 20 mg by mouth 3 (three) times daily. 12/14/15   Historical Provider, MD  bisacodyl (DULCOLAX) 10 MG suppository Place 1 suppository (10 mg total) rectally daily as needed for moderate constipation. Patient not taking: Reported on 01/09/2016 11/26/15   Florencia Reasons, MD  clonazePAM (KLONOPIN) 0.5 MG disintegrating tablet Place 1 tablet (0.5 mg total) into feeding tube at bedtime. 11/26/15   Florencia Reasons, MD  clonazePAM (KLONOPIN) 0.5 MG tablet Take 0.5 mg by mouth at bedtime as needed for sleep or anxiety. 11/30/15   Historical Provider, MD  collagenase (SANTYL) ointment Apply topically daily. Patient taking differently: Apply 1 application topically daily as needed (wound care).  11/26/15   Florencia Reasons, MD  [employer] VITAMIN B12 1000 MCG tablet Place 1 tablet (1,000 mcg total) into feeding tube daily. 11/26/15   Florencia Reasons, MD  doxycycline (VIBRA-TABS) 100 MG tablet Take 100 mg by mouth every 12 (twelve) hours. 11/30/15   Historical Provider, MD  DULoxetine (CYMBALTA) 30 MG capsule Take 30 mg by mouth 2 (two) times daily. 11/30/15   Historical Provider, MD  gabapentin (NEURONTIN) 250 MG/5ML solution Place 6 mLs (300 mg total) into feeding tube 3 (three) times daily. Patient not taking: Reported on 01/09/2016 11/26/15   Florencia Reasons, MD  gabapentin (NEURONTIN) 400 MG capsule Take 400 mg by mouth 4 (four) times daily. 10/16/15   Historical Provider, MD  ibuprofen (ADVIL,MOTRIN) 200 MG tablet Place 2 tablets (400 mg  total) into feeding tube every 6 (six) hours as needed for moderate pain. 11/26/15   Florencia Reasons, MD  lidocaine (LIDODERM) 5 % Place 1 patch onto the skin 2 (two) times daily as needed (pain).  04/04/15   Historical Provider, MD  Melatonin 3 MG TABS 2 tablets (6 mg total) by PEG Tube route at bedtime as needed (for sleep). Patient not taking: Reported on 01/09/2016 11/26/15   Florencia Reasons, MD  midodrine (PROAMATINE) 10 MG tablet Place 1 tablet (10 mg total) into feeding tube 3 (three) times daily with meals. 11/26/15   Florencia Reasons, MD  neomycin-bacitracin-polymyxin (NEOSPORIN) ointment Apply 1 application topically daily. apply to eye Patient taking differently: Apply 1 application topically daily as needed for wound care. apply to eye 11/26/15   Florencia Reasons, MD  Nutritional Supplements (FEEDING SUPPLEMENT, OSMOLITE 1.5 CAL,) LIQD Place 1,000 mLs into feeding tube daily. 11/27/15   Florencia Reasons, MD  oxyCODONE (OXY IR/ROXICODONE) 5 MG immediate release tablet Place 1 tablet (5 mg total) into feeding  tube every 6 (six) hours as needed for moderate pain or severe pain. 11/26/15   Florencia Reasons, MD  polyethylene glycol (MIRALAX / GLYCOLAX) packet Place 17 g into feeding tube daily. Patient taking differently: Place 17 g into feeding tube daily as needed for moderate constipation.  11/27/15   Florencia Reasons, MD  ranitidine (ZANTAC) 150 MG/10ML syrup Place 10 mLs (150 mg total) into feeding tube every 12 (twelve) hours. Patient taking differently: Place 150 mg into feeding tube 2 (two) times daily as needed for heartburn.  11/26/15   Florencia Reasons, MD  sennosides (SENOKOT) 8.8 MG/5ML syrup Place 5 mLs into feeding tube at bedtime as needed for mild constipation or moderate constipation. Patient not taking: Reported on 01/09/2016 11/26/15   Florencia Reasons, MD  silver sulfADIAZINE (SILVADENE) 1 % cream Apply 1 application topically every other day as needed (for wound care).    Historical Provider, MD  tiZANidine (ZANAFLEX) 4 MG tablet Take 4 mg by mouth 3 (three)  times daily. 11/30/15   Historical Provider, MD  venlafaxine (EFFEXOR) 75 MG tablet Place 1 tablet (75 mg total) into feeding tube 2 (two) times daily. Patient not taking: Reported on 01/09/2016 11/26/15   Florencia Reasons, MD  Water For Irrigation, Sterile (FREE WATER) SOLN Place 100 mLs into feeding tube every 8 (eight) hours. 11/26/15   Florencia Reasons, MD  Water For Irrigation, Sterile (FREE WATER) SOLN Place 100 mLs into feeding tube every 8 (eight) hours. Patient not taking: Reported on 01/09/2016 11/27/15   Florencia Reasons, MD  zinc oxide (BALMEX) 11.3 % CREA cream Apply 1 application topically 3 (three) times daily as needed (for skin redness/irritation).    Historical Provider, MD    Family History Family History  Problem Relation Age of Onset  . Hypertension Maternal Uncle     Social History Social History  Substance Use Topics  . Smoking status: Former Smoker    Packs/day: 1.00    Years: 25.00    Types: Cigarettes  . Smokeless tobacco: Never Used  . Alcohol use Yes     Comment: 2 x weekly     Allergies   Erythromycin; Nitrofurantoin monohyd macro; and Penicillins   Review of Systems Review of Systems  Respiratory: Positive for shortness of breath.   All other systems reviewed and are negative.    Physical Exam Updated Vital Signs Pulse 80   Temp 98.1 F (36.7 C)   Resp 16   LMP  (LMP Unknown)   SpO2 95%   Physical Exam  Constitutional: She is oriented to person, place, and time. She appears well-developed and well-nourished. No distress.  HENT:  Head: Normocephalic and atraumatic.  Neck:  6 CFS tracheostomy tube and anterior neck with passy muir valve in place  Cardiovascular: Normal rate and regular rhythm.   No murmur heard. Pulmonary/Chest: Effort normal. No respiratory distress.  Decreased air movement in the right lung base  Abdominal: Soft. There is no tenderness. There is no rebound and no guarding.  Musculoskeletal: She exhibits no tenderness.  Trace pitting edema  to bilateral lower extremities  Neurological: She is alert and oriented to person, place, and time.  Quadriparesis  Skin: Skin is warm and dry.  Psychiatric: She has a normal mood and affect. Her behavior is normal.  Nursing note and vitals reviewed.    ED Treatments / Results  Labs (all labs ordered are listed, but only abnormal results are displayed) Labs Reviewed  COMPREHENSIVE METABOLIC PANEL  CBC WITH DIFFERENTIAL/PLATELET  Randolm Idol, ED    EKG  EKG Interpretation None       Radiology No results found.  Procedures Procedures (including critical care time)  Medications Ordered in ED Medications  oxyCODONE-acetaminophen (PERCOCET/ROXICET) 5-325 MG per tablet 1 tablet (not administered)     Initial Impression / Assessment and Plan / ED Course  I have reviewed the triage vital signs and the nursing notes.  Pertinent labs & imaging results that were available during my care of the patient were reviewed by me and considered in my medical decision making (see chart for details).  Clinical Course     Patient here for evaluation of week and a half increased shortness of breath and increase secretions. She is nontoxic appearing on examination. Patient care transferred pending d-dimer, BNP.  Final Clinical Impressions(s) / ED Diagnoses   Final diagnoses:  None    New Prescriptions New Prescriptions   No medications on file     Quintella Reichert, MD 01/22/16 [phone]

## 2016-01-20 DIAGNOSIS — E440 Moderate protein-calorie malnutrition: Secondary | ICD-10-CM

## 2016-01-20 DIAGNOSIS — J4520 Mild intermittent asthma, uncomplicated: Secondary | ICD-10-CM

## 2016-01-20 LAB — CBC WITH DIFFERENTIAL/PLATELET
BASOS ABS: 0 10*3/uL (ref 0.0–0.1)
BASOS PCT: 0 %
Eosinophils Absolute: 0.4 10*3/uL (ref 0.0–0.7)
Eosinophils Relative: 8 %
HEMATOCRIT: 37.1 % (ref 36.0–46.0)
HEMOGLOBIN: 11.7 g/dL — AB (ref 12.0–15.0)
Lymphocytes Relative: 22 %
Lymphs Abs: 1 10*3/uL (ref 0.7–4.0)
MCH: 29.2 pg (ref 26.0–34.0)
MCHC: 31.5 g/dL (ref 30.0–36.0)
MCV: 92.5 fL (ref 78.0–100.0)
MONOS PCT: 6 %
Monocytes Absolute: 0.3 10*3/uL (ref 0.1–1.0)
NEUTROS ABS: 3 10*3/uL (ref 1.7–7.7)
NEUTROS PCT: 64 %
Platelets: 215 10*3/uL (ref 150–400)
RBC: 4.01 MIL/uL (ref 3.87–5.11)
RDW: 16.3 % — ABNORMAL HIGH (ref 11.5–15.5)
WBC: 4.8 10*3/uL (ref 4.0–10.5)

## 2016-01-20 LAB — COMPREHENSIVE METABOLIC PANEL
ALK PHOS: 176 U/L — AB (ref 38–126)
ALT: 77 U/L — ABNORMAL HIGH (ref 14–54)
ANION GAP: 9 (ref 5–15)
AST: 63 U/L — ABNORMAL HIGH (ref 15–41)
Albumin: 3.5 g/dL (ref 3.5–5.0)
BUN: 23 mg/dL — ABNORMAL HIGH (ref 6–20)
CALCIUM: 9.8 mg/dL (ref 8.9–10.3)
CO2: 28 mmol/L (ref 22–32)
Chloride: 98 mmol/L — ABNORMAL LOW (ref 101–111)
Creatinine, Ser: 0.33 mg/dL — ABNORMAL LOW (ref 0.44–1.00)
GFR calc non Af Amer: >60 mL/min (ref >60)
Glucose, Bld: 90 mg/dL (ref 65–99)
Potassium: 4.1 mmol/L (ref 3.5–5.1)
SODIUM: 135 mmol/L (ref 135–145)
TOTAL PROTEIN: 7.1 g/dL (ref 6.5–8.1)
Total Bilirubin: 0.5 mg/dL (ref 0.3–1.2)

## 2016-01-20 LAB — PREALBUMIN: Prealbumin: 24.9 mg/dL (ref 18–38)

## 2016-01-20 LAB — STREP PNEUMONIAE URINARY ANTIGEN: Strep Pneumo Urinary Antigen: NEGATIVE

## 2016-01-20 MED ORDER — CHLORHEXIDINE GLUCONATE 0.12 % MT SOLN
15.0000 mL | Freq: Two times a day (BID) | OROMUCOSAL | Status: DC
  Administered 2016-01-20 – 2016-01-23 (×7): 15 mL via OROMUCOSAL
  Filled 2016-01-20 (×7): qty 15

## 2016-01-20 MED ORDER — CLONAZEPAM 0.5 MG PO TBDP
0.5000 mg | ORAL_TABLET | Freq: Two times a day (BID) | ORAL | Status: DC | PRN
  Administered 2016-01-20 – 2016-01-23 (×7): 0.5 mg
  Filled 2016-01-20 (×8): qty 1

## 2016-01-20 MED ORDER — IPRATROPIUM-ALBUTEROL 0.5-2.5 (3) MG/3ML IN SOLN
3.0000 mL | Freq: Three times a day (TID) | RESPIRATORY_TRACT | Status: DC
  Administered 2016-01-20 – 2016-01-23 (×10): 3 mL via RESPIRATORY_TRACT
  Filled 2016-01-20 (×10): qty 3

## 2016-01-20 MED ORDER — OSMOLITE 1.5 CAL PO LIQD
1000.0000 mL | ORAL | Status: DC
  Administered 2016-01-20 – 2016-01-22 (×3): 1000 mL
  Filled 2016-01-20 (×3): qty 1000

## 2016-01-20 MED ORDER — ENOXAPARIN SODIUM 30 MG/0.3ML ~~LOC~~ SOLN
30.0000 mg | SUBCUTANEOUS | Status: DC
  Administered 2016-01-20 – 2016-01-22 (×3): 30 mg via SUBCUTANEOUS
  Filled 2016-01-20 (×3): qty 0.3

## 2016-01-20 MED ORDER — ORAL CARE MOUTH RINSE
15.0000 mL | Freq: Two times a day (BID) | OROMUCOSAL | Status: DC
  Administered 2016-01-20 – 2016-01-23 (×6): 15 mL via OROMUCOSAL

## 2016-01-20 MED ORDER — FREE WATER
100.0000 mL | Freq: Three times a day (TID) | Status: DC
  Administered 2016-01-20 – 2016-01-23 (×9): 100 mL

## 2016-01-20 MED ORDER — DEXTROSE 5 % IV SOLN
1.0000 g | Freq: Two times a day (BID) | INTRAVENOUS | Status: DC
  Administered 2016-01-20 – 2016-01-22 (×4): 1 g via INTRAVENOUS
  Filled 2016-01-20 (×5): qty 1

## 2016-01-20 MED ORDER — GUAIFENESIN 100 MG/5ML PO SOLN
15.0000 mL | Freq: Four times a day (QID) | ORAL | Status: DC
  Administered 2016-01-20 – 2016-01-23 (×13): 300 mg via ORAL
  Filled 2016-01-20 (×2): qty 20
  Filled 2016-01-20: qty 30
  Filled 2016-01-20 (×4): qty 20
  Filled 2016-01-20 (×2): qty 10
  Filled 2016-01-20 (×3): qty 20
  Filled 2016-01-20: qty 10
  Filled 2016-01-20: qty 20

## 2016-01-20 NOTE — Consult Note (Signed)
[facility] Nurse wound consult note Reason for Consult: Chronic non-healing pressure injuries.  Known to our department from previous admissions. Patient's POC is overseen by [employer] (Dr. Vernona Rieger).  She has [facility] services provided by [place]. Primary care providers continue to be husband, supported by her mother. Wound type:Pressure, mositure Pressure Ulcer POA: Yes Measurement: Right IT measures 4cm x 2cm x 0.4cm with hypergranulation tissue evident Stage 3 Left IT measures 4cm x 2cm x 0.6cm with areas of hypergranulation tissue evident Stage 3 Sacral measures 6cm x 10cm x 0.5cm with areas of hypergranulation tissue evident Bilateral Stage 1 pressure injuries measuring 3cm round at medial heels. Wound bed:All wounds are smooth (non-granulating) and with areas of hypergranulation tissue.  100% red, no necrotic tissue Drainage (amount, consistency, odor) moderate amount light green to light yellow exudate, no odor Periwound:intact with evidence of previous wound healing Dressing procedure/placement/frequency: I will discontinue the silicone foam dressings as I fear they may be contributing to excess moisture.  The wound contact dressing will be silver hydrofiber to manage moisture and hopefully control hypergranulation. Patient is on a therapeutic mattress with low air loss feature, which should also contribute to microclimate management. Bilateral Stage 1 pressure injuries require bilateral  pressure redistribution heel boots. Nursing staff is provided with guidance via the Orders for topical wound care, also turning and repositioning.  [facility] nursing team will not follow, but will remain available to this patient, the nursing and medical teams.  Please re-consult if needed. Thanks, Maudie Flakes, MSN, RN, [place], Arther Abbott  Pager# [phone]

## 2016-01-20 NOTE — Progress Notes (Signed)
Pharmacy Antibiotic Note  Angela Page is a 45 y.o. female admitted on 01/19/2016 with sepsis in setting of pneumonia and UTI.  PMH significant for traumatic spinal injury with paraplegia, recurrent UTIs, decubitus ulcers, tracheotomy.  Patient currently receiving empiric vancomycin and aztreonam. Although chart lists PCN allergy with reaction "hives", patient has tolerated cephalosporins previously, including cefepime during admission in Sept 2017.  Contacted Dr. Darrick Meigs and suggested switch from aztreonam to cefepime, received orders for same.  (Cefepime provides both gram-positive and gram-negative coverage, thereby facilitating future potential de-escalation).  Plan: 1. DC aztreonam 2. Begin cefepime 1 gram IV q12h 3. Continue vancomycin 500 mg IV q12h 4. F/U cultures, renal function, clinical course; consider de-escalation when feasible  Height: 5\' 5"  (165.1 cm) Weight: 95 lb 0.3 oz (43.1 kg) IBW/kg (Calculated) : 57  Temp (24hrs), Avg:98.1 F (36.7 C), Min:98 F (36.7 C), Max:98.2 F (36.8 C)   Recent Labs Lab 01/19/16 1535 01/19/16 2301 01/20/16 0108  WBC 7.4  --  4.8  CREATININE 0.35*  --  0.33*  LATICACIDVEN  --  0.97  --     Estimated Creatinine Clearance: 60.4 mL/min (by C-G formula based on SCr of 0.33 mg/dL (L)).    Allergies  Allergen Reactions  . Erythromycin Nausea And Vomiting  . Nitrofurantoin Monohyd Macro Nausea And Vomiting  . Penicillins Hives    Has patient had a PCN reaction causing immediate rash, facial/tongue/throat swelling, SOB or lightheadedness with hypotension: Yes Has patient had a PCN reaction causing severe rash involving mucus membranes or skin necrosis: Yes Has patient had a PCN reaction that required hospitalization Yes- went to the DR Has patient had a PCN reaction occurring within the last 10 years: No-childhood allergy If all of the above answers are "NO", then may proceed with Cephalosporin use.     Antimicrobials this  admission: 12/30 Levofloxacin x 1 12/30 Vanc >>   12/30 Aztreonam >>12/31 12/31 Cefepime  Dose adjustments this admission:  --  Microbiology results: 12/30 BCx: collected 12/31 BCx: collected 12/30 Sputum: collected   Thank you for allowing pharmacy to be a part of this patient's care.  Clayburn Pert, PharmD, BCPS Pager: [phone] 01/20/2016  11:37 AM

## 2016-01-20 NOTE — Progress Notes (Signed)
[facility]  PROGRESS NOTE  Angela Page [id number] DOB: [birth year] DOA: 01/19/2016 PCP: PROVIDER NOT IN SYSTEM   Brief HPI:    45 y.o. 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   Assessment/Plan:     Acute respiratory failure with hypoxia - likely multifactorial combination of new diagnosis of pneumonia versus atelectasis secondary to bronchial occlusion secondary to mucous plugging. Called and discussed with pulmonary. Dr [place] Bells to see the patient. Continue vancomycin , cefepime.  . Asthma-  does not appear to have any wheezing we will continue when necessary albuterol and  DuoNeb  . Decubitus ulcer of sacral region, stage 4 ([place]) order wound consult   . Malnutrition of moderate degree- order prealbumin,nutritionalconsult  . HCAP (healthcare-associated pneumonia) - admit for broad coverage antibiotic coverage to evaluate for aspiration Likely need to be discharged home with home oxygen when necessary    DVT prophylaxis: Lovenox  Code Status: Full code  Family Communication: No family at bedside  Disposition Plan: Pending improvement in respiratory status   Consultants:  None   Procedures:  None   Continuous infusions . sodium chloride 75 mL/hr at 01/20/16 0510      Antibiotics:   Anti-infectives    Start     Dose/Rate Route Frequency Ordered Stop   01/20/16 1400  ceFEPIme (MAXIPIME) 1 g in dextrose 5 % 50 mL IVPB     1 g 100 mL/hr over 30 Minutes Intravenous Every 12 hours 01/20/16 1114     01/20/16 1100  vancomycin (VANCOCIN) 500 mg in sodium chloride 0.9 % 100 mL IVPB     500 mg 100 mL/hr over 60 Minutes Intravenous Every  12 hours 01/19/16 2328     01/20/16 0600  aztreonam (AZACTAM) 2 g in dextrose 5 % 50 mL IVPB  Status:  Discontinued     2 g 100 mL/hr over 30 Minutes Intravenous Every 8 hours 01/19/16 2313 01/20/16 1109   01/19/16 2115  vancomycin (VANCOCIN) IVPB 1000 mg/200 mL premix     1,000 mg 200 mL/hr over 60 Minutes Intravenous  Once 01/19/16 2110 01/19/16 2342   01/19/16 2100  levofloxacin (LEVAQUIN) IVPB 750 mg     750 mg 100 mL/hr over 90 Minutes Intravenous  Once 01/19/16 2048 01/19/16 2240       Objective   Vitals:   01/20/16 0952 01/20/16 1215 01/20/16 1502 01/20/16 1622  BP:    127/87  Pulse: 86 80    Resp: 16 18  18   Temp:    98 F (36.7 C)  TempSrc:    Oral  SpO2: 96% 97% 99% 98%  Weight:      Height:        Intake/Output Summary (Last 24 hours) at 01/20/16 1751 Last data filed at 01/20/16 1739  Gross per 24 hour  Intake          1326.25 ml  Output             2350 ml  Net         -1023.75 ml   Filed Weights   01/19/16 2324 01/20/16 0200  Weight: 43.1 kg (95 lb 0.3 oz) 43.1 kg (95 lb 0.3 oz)  Physical Examination:  General exam: Appears calm and comfortable. Respiratory system: Trach collar in place, Clear to auscultation. Respiratory effort normal. Cardiovascular system:  RRR. No  murmurs, rubs, gallops. No pedal edema. GI system: Abdomen is nondistended, soft and nontender. No organomegaly.  Central nervous system. No focal neurological deficits. 5 x 5 power in all extremities. Skin: No rashes, lesions or ulcers. Psychiatry: Alert, oriented x 3.Judgement and insight appear normal. Affect normal.    Data Reviewed: I have personally reviewed following labs and imaging studies  CBG: No results for input(s): GLUCAP in the last 168 hours.  CBC:  Recent Labs Lab 01/19/16 1535 01/20/16 0108  WBC 7.4 4.8  NEUTROABS 5.5 3.0  HGB 13.3 11.7*  HCT 40.5 37.1  MCV 94.2 92.5  PLT 243 [id number]    Basic Metabolic Panel:  Recent Labs Lab 01/19/16 1535  01/20/16 0108  NA 133* 135  K 4.5 4.1  CL 95* 98*  CO2 28 28  GLUCOSE 110* 90  BUN 26* 23*  CREATININE 0.35* 0.33*  CALCIUM 9.6 9.8    Recent Results (from the past 240 hour(s))  Culture, respiratory (NON-Expectorated)     Status: None (Preliminary result)   Collection Time: 01/20/16 12:44 AM  Result Value Ref Range Status   Specimen Description TRACHEAL SITE  Final   Special Requests NONE  Final   Gram Stain   Final    RARE WBC PRESENT, PREDOMINANTLY PMN RARE SQUAMOUS EPITHELIAL CELLS PRESENT FEW GRAM NEGATIVE COCCI RARE GRAM POSITIVE RODS RARE GRAM POSITIVE COCCI IN PAIRS Performed at [facility]    Culture PENDING  Incomplete   Report Status PENDING  Incomplete     Liver Function Tests:  Recent Labs Lab 01/19/16 1535 01/20/16 0108  AST 81* 63*  ALT 85* 77*  ALKPHOS 205* 176*  BILITOT 0.4 0.5  PROT 7.6 7.1  ALBUMIN 3.9 3.5   No results for input(s): LIPASE, AMYLASE in the last 168 hours. No results for input(s): AMMONIA in the last 168 hours.  Cardiac Enzymes: No results for input(s): CKTOTAL, CKMB, CKMBINDEX, TROPONINI in the last 168 hours. BNP (last 3 results)  Recent Labs  01/09/16 1612 01/19/16 1535  BNP 30.4 27.4    ProBNP (last 3 results) No results for input(s): PROBNP in the last 8760 hours.    Studies: Dg Chest 2 View  Result Date: 01/19/2016 CLINICAL DATA:  Worsening O2 saturation this morning. EXAM: CHEST  2 VIEW COMPARISON:  January 09, 2016 FINDINGS: The heart size and mediastinal contours are stable. Tracheostomy tube is unchanged. There is right pleural effusion with consolidation right lung base unchanged. There is mild interstitial edema. The visualized skeletal structures are stable. IMPRESSION: Persistent right pleural effusion with consolidation right lung base unchanged. Mild interstitial edema. Electronically Signed   By: Abelardo Diesel M.D.   On: 01/19/2016 16:32   Ct Angio Chest Pe W And/or Wo Contrast  Result  Date: 01/19/2016 CLINICAL DATA:  Shortness of breath. EXAM: CT ANGIOGRAPHY CHEST WITH CONTRAST TECHNIQUE: Multidetector CT imaging of the chest was performed using the standard protocol during bolus administration of intravenous contrast. Multiplanar CT image reconstructions and MIPs were obtained to evaluate the vascular anatomy. CONTRAST:  100 mL of Isovue 370 intravenously. COMPARISON:  Radiographs of same day.  CT scan of November 01, 2015. FINDINGS: Cardiovascular: There is no evidence of thoracic aortic dissection or aneurysm. There is no definite evidence of pulmonary embolus. Mediastinum/Nodes: Tracheostomy tube is in grossly good position. Left-sided pacemaker is  unchanged in position. 1.2 cm precarinal lymph node is noted. Thyroid gland is unremarkable. Lungs/Pleura: No pneumothorax is noted. Right lower lobe atelectasis or infiltrate is noted, which appears to be due to bronchial occlusion or mucous plugging. Right upper lobe airspace opacity is noted concerning for pneumonia. Upper Abdomen: No acute abnormality. Musculoskeletal: No chest wall abnormality. No acute or significant osseous findings. Review of the MIP images confirms the above findings. IMPRESSION: No definite evidence of pulmonary embolus. Tracheostomy tube in grossly good position. 1.2 cm precarinal lymph node is noted which is enlarged compared to prior exam. It is uncertain if this is inflammatory or neoplastic in origin; clinical correlation is recommended. Right upper lobe airspace opacity is noted concerning for pneumonia. Right lower lobe atelectasis or infiltrate is noted, which appears to be due to occlusion of lower lobe bronchus, potentially due to mucous plugging. Bronchoscopy is recommended for further evaluation. Electronically Signed   By: Marijo Conception, M.D.   On: 01/19/2016 20:39    Scheduled Meds: . baclofen  20 mg Per Tube TID  . ceFEPime (MAXIPIME) IV  1 g Intravenous Q12H  . chlorhexidine  15 mL Mouth Rinse BID   . DULoxetine  30 mg Oral BID  . enoxaparin (LOVENOX) injection  30 mg Subcutaneous Q24H  . feeding supplement (OSMOLITE 1.5 CAL)  1,000 mL Per Tube Q24H  . free water  100 mL Per Tube Q8H  . gabapentin  400 mg Per Tube QID  . guaiFENesin  15 mL Oral QID  . ipratropium-albuterol  3 mL Nebulization TID  . mouth rinse  15 mL Mouth Rinse q12n4p  . midodrine  10 mg Per Tube TID WC  . tiZANidine  4 mg Per Tube TID  . vancomycin  500 mg Intravenous Q12H      Time spent: 25 min  [facility] Pager [phone]. If 7PM-7AM, please contact night-coverage at [email or web address], Office  [phone]  password [email or web address] 01/20/2016, 5:51 PM  LOS: 1 day

## 2016-01-20 NOTE — Progress Notes (Addendum)
SLP Cancellation Note  Patient Details Name: Angela Page MRN: [id number] DOB: 1970/04/19   Cancelled treatment:       Reason Eval/Treat Not Completed: Other (comment). Reviewed pts history and talked with her mother. Pt typically relies on PEG tube for nutrition and consumes full liquid diet given history of severe esophageal dysphagia. She eats a few purees and puddings occasionally at home, but never regular solids. Given this diet and history, will change pts diet from regular/thin to full liquids and plan to f/u tomorrow with clinical swallow eval at bedside.    Kenston Longton, [name] 01/20/2016, 3:25 PM

## 2016-01-21 ENCOUNTER — Inpatient Hospital Stay (HOSPITAL_COMMUNITY): Payer: Medicaid Other

## 2016-01-21 DIAGNOSIS — J9601 Acute respiratory failure with hypoxia: Secondary | ICD-10-CM

## 2016-01-21 DIAGNOSIS — R1311 Dysphagia, oral phase: Secondary | ICD-10-CM

## 2016-01-21 DIAGNOSIS — J189 Pneumonia, unspecified organism: Secondary | ICD-10-CM

## 2016-01-21 DIAGNOSIS — L89154 Pressure ulcer of sacral region, stage 4: Secondary | ICD-10-CM

## 2016-01-21 LAB — BLOOD CULTURE ID PANEL (REFLEXED)
Acinetobacter baumannii: NOT DETECTED
Candida albicans: NOT DETECTED
Candida glabrata: NOT DETECTED
Candida krusei: NOT DETECTED
Candida parapsilosis: NOT DETECTED
Candida tropicalis: NOT DETECTED
ENTEROBACTER CLOACAE COMPLEX: NOT DETECTED
ENTEROCOCCUS SPECIES: NOT DETECTED
Enterobacteriaceae species: NOT DETECTED
Escherichia coli: NOT DETECTED
Haemophilus influenzae: NOT DETECTED
Klebsiella oxytoca: NOT DETECTED
Klebsiella pneumoniae: NOT DETECTED
LISTERIA MONOCYTOGENES: NOT DETECTED
METHICILLIN RESISTANCE: DETECTED — AB
NEISSERIA MENINGITIDIS: NOT DETECTED
PROTEUS SPECIES: NOT DETECTED
Pseudomonas aeruginosa: NOT DETECTED
SERRATIA MARCESCENS: NOT DETECTED
STAPHYLOCOCCUS AUREUS BCID: NOT DETECTED
STREPTOCOCCUS AGALACTIAE: NOT DETECTED
STREPTOCOCCUS SPECIES: NOT DETECTED
Staphylococcus species: DETECTED — AB
Streptococcus pneumoniae: NOT DETECTED
Streptococcus pyogenes: NOT DETECTED

## 2016-01-21 LAB — CBC
HEMATOCRIT: 35.3 % — AB (ref 36.0–46.0)
Hemoglobin: 11.5 g/dL — ABNORMAL LOW (ref 12.0–15.0)
MCH: 30.7 pg (ref 26.0–34.0)
MCHC: 32.6 g/dL (ref 30.0–36.0)
MCV: 94.1 fL (ref 78.0–100.0)
Platelets: 233 10*3/uL (ref 150–400)
RBC: 3.75 MIL/uL — ABNORMAL LOW (ref 3.87–5.11)
RDW: 15.9 % — AB (ref 11.5–15.5)
WBC: 4.5 10*3/uL (ref 4.0–10.5)

## 2016-01-21 LAB — COMPREHENSIVE METABOLIC PANEL
ALT: 57 U/L — ABNORMAL HIGH (ref 14–54)
AST: 47 U/L — AB (ref 15–41)
Albumin: 3.3 g/dL — ABNORMAL LOW (ref 3.5–5.0)
Alkaline Phosphatase: 147 U/L — ABNORMAL HIGH (ref 38–126)
Anion gap: 8 (ref 5–15)
BILIRUBIN TOTAL: 0.4 mg/dL (ref 0.3–1.2)
BUN: 16 mg/dL (ref 6–20)
CO2: 28 mmol/L (ref 22–32)
Calcium: 9.5 mg/dL (ref 8.9–10.3)
Chloride: 102 mmol/L (ref 101–111)
Creatinine, Ser: 0.32 mg/dL — ABNORMAL LOW (ref 0.44–1.00)
GFR calc Af Amer: >60 mL/min (ref >60)
Glucose, Bld: 150 mg/dL — ABNORMAL HIGH (ref 65–99)
POTASSIUM: 4.2 mmol/L (ref 3.5–5.1)
Sodium: 138 mmol/L (ref 135–145)
TOTAL PROTEIN: 6.7 g/dL (ref 6.5–8.1)

## 2016-01-21 NOTE — Consult Note (Signed)
PULMONARY / CRITICAL CARE MEDICINE   Name: Angela Page MRN: [id number] DOB: 1970-06-15    ADMISSION DATE:  01/19/2016 CONSULTATION DATE:  01/21/2016  REFERRING MD:  Darrick Meigs  CHIEF COMPLAINT:  Dyspnea  HISTORY OF PRESENT ILLNESS:   46 y/o female with a history of cervical and thoracic spinal cord injury who has recurrent aspiration pneumonia and a tracheostomy in place presented to the [facility] emergency room with shortness of breath. She states that she believes this came from her eating solid food at home despite recommendations against this. Last week she ate some spaghetti and she says that she developed increasing cough, mucus production, and shortness of breath. Notably, she was in for a long hospitalization in October 2017 when she required mechanical ventilatory support and tracheostomy placement. She ultimately had a PEG tube placed at that time. Multiple speech therapy evaluations showed that she has recurrent aspiration and she's been advised to stick to a liquid diet. However, she states that it home she's been eating more pudding and solid food. About 2-3 weeks ago she came into the ER for evaluation and was given a few days of antibiotics and discharged home. She says that despite that she continue to have cough and chest congestion. She was admitted by the hospitalist service ultimately on December 30 and was treated with chest physiotherapy and IV antibiotics. Pulmonary and critical care medicine was consulted because of concern for persistent collapse of the right lower lobe.  She is now feeling better but is still requiring oxygen.  PAST MEDICAL HISTORY :  She  has a past medical history of Asthma; GERD (gastroesophageal reflux disease); Pacemaker; Pleurisy; Protein calorie malnutrition ([place]) (10/31/2015); Quadriparesis ([place]); Quadriplegia, C5-C7 incomplete ([place]) (10/08/2013); and Stage IV pressure ulcer of sacral region ([place]) (10/31/2015).  PAST SURGICAL HISTORY: She  has a  past surgical history that includes Appendectomy; Cardiac surgery; I&D extremity (10/28/2011); ir generic historical (11/23/2015); Back surgery (10/08/2013); Tracheostomy; and Gastrostomy (11/23/2015).  Allergies  Allergen Reactions  . Erythromycin Nausea And Vomiting  . Nitrofurantoin Monohyd Macro Nausea And Vomiting  . Penicillins Hives    Has patient had a PCN reaction causing immediate rash, facial/tongue/throat swelling, SOB or lightheadedness with hypotension: Yes Has patient had a PCN reaction causing severe rash involving mucus membranes or skin necrosis: Yes Has patient had a PCN reaction that required hospitalization Yes- went to the DR Has patient had a PCN reaction occurring within the last 10 years: No-childhood allergy If all of the above answers are "NO", then may proceed with Cephalosporin use.     No current facility-administered medications on file prior to encounter.    Current Outpatient Prescriptions on File Prior to Encounter  Medication Sig  . acetaminophen (TYLENOL) 500 MG tablet Place 1 tablet (500 mg total) into feeding tube every 6 (six) hours as needed for moderate pain.  Marland Kitchen albuterol (PROVENTIL HFA;VENTOLIN HFA) 108 (90 BASE) MCG/ACT inhaler Inhale 2 puffs into the lungs every 6 (six) hours as needed. For shortness of breath   . albuterol (PROVENTIL) (2.5 MG/3ML) 0.083% nebulizer solution Take 2.5 mg by nebulization every 6 (six) hours as needed for wheezing or shortness of breath.  . baclofen (LIORESAL) 20 MG tablet Take 20 mg by mouth 3 (three) times daily.  . clonazePAM (KLONOPIN) 0.5 MG disintegrating tablet Place 1 tablet (0.5 mg total) into feeding tube at bedtime.  . collagenase (SANTYL) ointment Apply topically daily. (Patient taking differently: Apply 1 application topically daily as needed (wound care). )  .  [employer] VITAMIN B12 1000 MCG tablet Place 1 tablet (1,000 mcg total) into feeding tube daily.  Marland Kitchen doxycycline (VIBRA-TABS) 100 MG tablet Take 100 mg by  mouth every 12 (twelve) hours.  . DULoxetine (CYMBALTA) 30 MG capsule Take 30 mg by mouth 2 (two) times daily.  Marland Kitchen gabapentin (NEURONTIN) 400 MG capsule Take 400 mg by mouth 4 (four) times daily.  Marland Kitchen ibuprofen (ADVIL,MOTRIN) 200 MG tablet Place 2 tablets (400 mg total) into feeding tube every 6 (six) hours as needed for moderate pain.  Marland Kitchen lidocaine (LIDODERM) 5 % Place 1 patch onto the skin 2 (two) times daily as needed (pain).   . midodrine (PROAMATINE) 10 MG tablet Place 1 tablet (10 mg total) into feeding tube 3 (three) times daily with meals.  Marland Kitchen neomycin-bacitracin-polymyxin (NEOSPORIN) ointment Apply 1 application topically daily. apply to eye (Patient taking differently: Apply 1 application topically daily as needed for wound care. apply to eye)  . oxyCODONE (OXY IR/ROXICODONE) 5 MG immediate release tablet Place 1 tablet (5 mg total) into feeding tube every 6 (six) hours as needed for moderate pain or severe pain.  . polyethylene glycol (MIRALAX / GLYCOLAX) packet Place 17 g into feeding tube daily. (Patient taking differently: Place 17 g into feeding tube daily as needed for moderate constipation. )  . ranitidine (ZANTAC) 150 MG/10ML syrup Place 10 mLs (150 mg total) into feeding tube every 12 (twelve) hours. (Patient taking differently: Place 150 mg into feeding tube 2 (two) times daily as needed for heartburn. )  . silver sulfADIAZINE (SILVADENE) 1 % cream Apply 1 application topically every other day as needed (for wound care).  Marland Kitchen tiZANidine (ZANAFLEX) 4 MG tablet Take 4 mg by mouth 3 (three) times daily.  Marland Kitchen zinc oxide (BALMEX) 11.3 % CREA cream Apply 1 application topically 3 (three) times daily as needed (for skin redness/irritation).  . Amino Acids-Protein Hydrolys (FEEDING SUPPLEMENT, PRO-STAT SUGAR FREE 64,) LIQD Place 30 mLs into feeding tube 2 (two) times daily.  . baclofen (LIORESAL) 10 MG tablet Place 1 tablet (10 mg total) into feeding tube 3 (three) times daily. (Patient not taking:  Reported on 01/09/2016)  . bisacodyl (DULCOLAX) 10 MG suppository Place 1 suppository (10 mg total) rectally daily as needed for moderate constipation. (Patient not taking: Reported on 01/09/2016)  . gabapentin (NEURONTIN) 250 MG/5ML solution Place 6 mLs (300 mg total) into feeding tube 3 (three) times daily. (Patient not taking: Reported on 01/09/2016)  . Melatonin 3 MG TABS 2 tablets (6 mg total) by PEG Tube route at bedtime as needed (for sleep). (Patient not taking: Reported on 01/09/2016)  . Nutritional Supplements (FEEDING SUPPLEMENT, OSMOLITE 1.5 CAL,) LIQD Place 1,000 mLs into feeding tube daily.  . sennosides (SENOKOT) 8.8 MG/5ML syrup Place 5 mLs into feeding tube at bedtime as needed for mild constipation or moderate constipation. (Patient not taking: Reported on 01/09/2016)  . venlafaxine (EFFEXOR) 75 MG tablet Place 1 tablet (75 mg total) into feeding tube 2 (two) times daily. (Patient not taking: Reported on 01/09/2016)  . Water For Irrigation, Sterile (FREE WATER) SOLN Place 100 mLs into feeding tube every 8 (eight) hours.  . Water For Irrigation, Sterile (FREE WATER) SOLN Place 100 mLs into feeding tube every 8 (eight) hours. (Patient not taking: Reported on 01/09/2016)    FAMILY HISTORY:  Her indicated that the status of her maternal uncle is unknown.    SOCIAL HISTORY: She  reports that she has quit smoking. Her smoking use included Cigarettes. She has  a 25.00 pack-year smoking history. She has never used smokeless tobacco. She reports that she drinks alcohol. She reports that she does not use drugs.  REVIEW OF SYSTEMS:   Gen: Denies fever, chills, weight change, fatigue, night sweats HEENT: Denies blurred vision, double vision, hearing loss, tinnitus, sinus congestion, rhinorrhea, sore throat, neck stiffness, dysphagia PULM: per HPI CV: Denies chest pain, edema, orthopnea, paroxysmal nocturnal dyspnea, palpitations GI: Denies abdominal pain, nausea, vomiting, diarrhea,  hematochezia, melena, constipation, change in bowel habits GU: Denies dysuria, hematuria, polyuria, oliguria, urethral discharge Endocrine: Denies hot or cold intolerance, polyuria, polyphagia or appetite change Derm: Denies rash, dry skin, scaling or peeling skin change Heme: Denies easy bruising, bleeding, bleeding gums Neuro: Denies headache, numbness, has baseline paraplegia, denies slurred speech, loss of memory or consciousness   SUBJECTIVE:  As above  VITAL SIGNS: BP (!) 94/53 (BP Location: Right Arm)   Pulse 84   Temp 99.1 F (37.3 C) (Oral)   Resp 18   Ht 5\' 5"  (1.651 m)   Wt 95 lb 0.3 oz (43.1 kg)   LMP  (LMP Unknown)   SpO2 94%   BMI 15.81 kg/m   HEMODYNAMICS:    VENTILATOR SETTINGS: FiO2 (%):  [28 %] 28 %  INTAKE / OUTPUT: I/O last 3 completed shifts: In: 3161.5 [P.O.:240; I.V.:2012.5; Other:90; NG/GT:669; IV Piggyback:150] Out: 2800 [Urine:2800]  PHYSICAL EXAMINATION: General:  Chronically ill-appearing but in no distress in bed Neuro:  Awake, alert, conversant HEENT:  Normocephalic atraumatic, tracheostomy in place Cardiovascular:  Regular rate and rhythm, no murmurs gallops or rubs Lungs:  Diminished breath sounds right face but some air movement on the right with wheezing, clear on left, normal respiratory effort Abdomen:  Bowel sounds positive, nontender nondistended, PEG tube in place Musculoskeletal:  Diminished bulk and tone upper extremities and lower extremities are baseline Skin:  No rash or skin breakdown  LABS:  BMET  Recent Labs Lab 01/19/16 1535 01/20/16 0108 01/21/16 0429  NA 133* 135 138  K 4.5 4.1 4.2  CL 95* 98* 102  CO2 28 28 28   BUN 26* 23* 16  CREATININE 0.35* 0.33* 0.32*  GLUCOSE 110* 90 150*    Electrolytes  Recent Labs Lab 01/19/16 1535 01/20/16 0108 01/21/16 0429  CALCIUM 9.6 9.8 9.5    CBC  Recent Labs Lab 01/19/16 1535 01/20/16 0108 01/21/16 0429  WBC 7.4 4.8 4.5  HGB 13.3 11.7* 11.5*  HCT 40.5  37.1 35.3*  PLT 243 215 233    Coag's No results for input(s): APTT, INR in the last 168 hours.  Sepsis Markers  Recent Labs Lab 01/19/16 2301  LATICACIDVEN 0.97    ABG No results for input(s): PHART, PCO2ART, PO2ART in the last 168 hours.  Liver Enzymes  Recent Labs Lab 01/19/16 1535 01/20/16 0108 01/21/16 0429  AST 81* 63* 47*  ALT 85* 77* 57*  ALKPHOS 205* 176* 147*  BILITOT 0.4 0.5 0.4  ALBUMIN 3.9 3.5 3.3*    Cardiac Enzymes No results for input(s): TROPONINI, PROBNP in the last 168 hours.  Glucose No results for input(s): GLUCAP in the last 168 hours.  Imaging No results found.   STUDIES:  01/19/2016 chest CT images personally reviewed: There is collapse of the right lower lobe with some mucus plugging, there is also patchy years based disease in the right upper lobe but otherwise her lungs are clear. Overall her pulmonary parenchyma has significantly improved compared to the October 2017 images.  CULTURES: 01/20/2016 blood culture GPC in  clusters, staph species but not staph aureus, methicillin-resistant 01/20/2016 respiratory culture, multiple  on Gram stain organism types   ANTIBIOTICS: Cefepime Vancomycin   SIGNIFICANT EVENTS:   LINES/TUBES: Tracheostomy placed October 2017   DISCUSSION:  This is a 46 year old female who has a past medical history significant for cervical and thoracic spinal cord injury with recurrent aspiration pneumonia and recurrent acute respiratory failure with hypoxemia due to the same. She's now back again with acute respiratory failure with hypoxemia secondary to aspiration pneumonia in the setting of noncompliance with her dietary regimen and speech therapy recommendations. At this point she seems to have improved clinically but she still has some mild hypoxemia. She's near baseline. She still not moving great air in the right lung. From my perspective I think the tracheostomy should never be removed. There is no role  for a bronchoscopy here as she had several bronchoscopies while intubated back in 10/2015 and no mass was detected.  She will need ongoing mucociliary clearance measures which can be facilitated with manual chest percussion.   ASSESMENT / PLAN:  PULMONARY A: Aspiration pneumonia Dysphagia with non-compliance to SLP recommendations Hypoxemia Tracheostomy status P:   Strict adherence to speech therapy recommendations  Would consult case manager for the following: Home oxygen to maintain O2 saturation > 92% Instructions to home health nursing staff: provide manual chest percussion to right hemithorax bid while patient is lying with left side down, right side up. This therapy could be provided at home The only question I have about discharge is what antibiotic to use as the respiratory culture has not returned yet.  Augmentin may suffice given her clinical improvement, but would watch cultures closely and be sure to stay in contact with her after discharge if culture reveals a highly resistant organism. Continue f/u with the tracheostomy clinic as scheduled Continue passey muir valve May ultimately need chest vest at home if she continues to have recurrent hypoxemia and respiratory infections despite adherence to speech therapy recommendations   Roselie Awkward, MD [facility] Pager: [phone] Cell: [phone] After 3pm or if no response, call [phone]   01/21/2016, 9:07 AM

## 2016-01-21 NOTE — Evaluation (Signed)
Clinical/Bedside Swallow Evaluation Patient Details  Name: Arbella Krolik MRN: [id number] Date of Birth: [birth year]  Today's Date: 01/21/2016 Time: SLP Start Time (ACUTE ONLY): 0803 SLP Stop Time (ACUTE ONLY): 0824 SLP Time Calculation (min) (ACUTE ONLY): 21 min  Past Medical History:  Past Medical History:  Diagnosis Date  . Asthma   . GERD (gastroesophageal reflux disease)   . Pacemaker   . Pleurisy   . Protein calorie malnutrition ([place]) 10/31/2015  . Quadriparesis ([place])   . Quadriplegia, C5-C7 incomplete ([place]) 10/08/2013  . Stage IV pressure ulcer of sacral region [facility]) 10/31/2015   Past Surgical History:  Past Surgical History:  Procedure Laterality Date  . APPENDECTOMY    . BACK SURGERY  10/08/2013   fusion of spine, cervical region  . CARDIAC SURGERY    . GASTROSTOMY  11/23/2015  . I&D EXTREMITY  10/28/2011   Procedure: IRRIGATION AND DEBRIDEMENT EXTREMITY;  Surgeon: Tennis Must, MD;  Location: [facility];  Service: Orthopedics;  Laterality: Right;  Irrigation and debridement right middle finger  . IR GENERIC HISTORICAL  11/23/2015   IR GASTROSTOMY TUBE MOD SED 11/23/2015 [facility]-INTERV RAD  . TRACHEOSTOMY     HPI:  Tiahna Shiers a 46 y.o.femalewith medical history significant of traumatic spinal injury with paraplegia s/p fall with C2-7 fusion, recurrent UTIs, decubitus ulcers, pacemaker for sick sinus syndrome, tobacco abuse, GERD, and tracheotomy s/p ICU admission for respiratory failure due to PNA/UTI sepsis.  Presented with intermittent shortness of breath is worse in Am gets better when she uses oxygen.  Patient continues to chain smoke she has had numerous admissions in the past including 1 months ago. For pneumonia. She had a visit to emergency department on December 20 for shortness of breath with oxygen saturation down to 86% she was diagnosed with questionable pneumonia treated with clindamycin and albuterol when necessary and discharged to home. At  home she's been using oxygen from her mom says she is herself does not have oxygen prescription. While in oxygen her oxygen saturation improved to 97%.  Patient known to ST service from previous admission with previous MBS which found significant esopahgeal issues.  She currently has a trach and PEG in place and the patient reported that she recieves most of her nutrition via the PEG and just eats a little bit extra during the day.  Most recent chest xray is showing persistent right pleural effusion with consolidation right lung base unchanged.   Assessment / Plan / Recommendation Clinical Impression  Clinical swallowing evaluation was completed using thin liquids via tsp, cup and straw sips and pureed material. Oral mechanism exam was completed and unremarkable.  When ST entered room the patient was noted to have PMSV in place and that she had a baseline cough that was intermittently productive.  The patient reported that she recieves most of her calories via her PEG and that she just eats/drinks a little each day.  She has had mutliple PNAs.  Her most current chest xray is read for persistent right pleural effusion with consolidation in the right lung base unchanged.  Given thin liquids and pureed material the patient had delayed cough across all textures that was difficult to tell if related to aspiration given baseline cough when ST entered room.   Recommend that the patient continue on her current diet of full liquids and that a repeat MBS be completed next date to determine current swallowing physiology and least restrictive diet.      Aspiration Risk  Moderate  aspiration risk    Diet Recommendation   Full liquids pending results of MBS next date.    Medication Administration: Via alternative means    Other  Recommendations Oral Care Recommendations: Oral care QID   Follow up Recommendations  (TBD)      Frequency and Duration min 2x/week  2 weeks       Prognosis Prognosis for Safe Diet  Advancement: Fair Barriers to Reach Goals: Severity of deficits      Swallow Study   General Date of Onset: 01/19/16 HPI: Fidelia Hromadka a 46 y.o.femalewith medical history significant of traumatic spinal injury with paraplegia s/p fall with C2-7 fusion, recurrent UTIs, decubitus ulcers, pacemaker for sick sinus syndrome, tobacco abuse, GERD, and tracheotomy s/p ICU admission for respiratory failure due to PNA/UTI sepsis.  Presented with intermittent shortness of breath is worse in Am gets better when she uses oxygen.  Patient continues to chain smoke she has had numerous admissions in the past including 1 months ago. For pneumonia. She had a visit to emergency department on December 20 for shortness of breath with oxygen saturation down to 86% she was diagnosed with questionable pneumonia treated with clindamycin and albuterol when necessary and discharged to home. At home she's been using oxygen from her mom says she is herself does not have oxygen prescription. While in oxygen her oxygen saturation improved to 97%.  Patient known to ST service from previous admission with previous MBS which found significant esopahgeal issues.  She currently has a trach and PEG in place and the patient reported that she recieves most of her nutrition via the PEG and just eats a little bit extra during the day.  Most recent chest xray is showing persistent right pleural effusion with consolidation right lung base unchanged. Type of Study: Bedside Swallow Evaluation Previous Swallow Assessment: MBS dated 11/16/15  Pt was recommended to be NPO but she wanted to continue to eat despite the risks.   Diet Prior to this Study: Other (Comment) (Full liquids.  ) Temperature Spikes Noted: Yes (1 low grade since admission.  ) Respiratory Status: Trach Collar (with PMSV in place when ST entered room.  ) History of Recent Intubation: No (Not during this admission.  ) Behavior/Cognition: Alert;Cooperative;Pleasant  mood Oral Cavity Assessment: Within Functional Limits Oral Care Completed by SLP: No Oral Cavity - Dentition: Poor condition;Missing dentition Vision: Functional for self-feeding Self-Feeding Abilities: Total assist Patient Positioning: Upright in bed Baseline Vocal Quality: Low vocal intensity;Hoarse Volitional Cough: Weak Volitional Swallow: Able to elicit    Oral/Motor/Sensory Function Overall Oral Motor/Sensory Function: Within functional limits   Ice Chips Ice chips: Not tested   Thin Liquid Thin Liquid: Impaired Presentation: Cup;Spoon;Straw Pharyngeal  Phase Impairments: Suspected delayed Swallow;Cough - Delayed    Nectar Thick Nectar Thick Liquid: Not tested   Honey Thick Honey Thick Liquid: Not tested   Puree Puree: Impaired Presentation: Spoon Pharyngeal Phase Impairments: Cough - Delayed;Multiple swallows   Solid   GO   Solid: Not tested        Shelly Flatten, MA, CCC-SLP Acute Rehab SLP [id number] Shelly Flatten N 01/21/2016,8:34 AM

## 2016-01-21 NOTE — Progress Notes (Signed)
[facility]  PROGRESS NOTE  Angela Page [id number] DOB: [birth year] DOA: 01/19/2016 PCP: PROVIDER NOT IN SYSTEM   Brief HPI:    46 y.o. 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 Seen by pulmonary.   Assessment/Plan:     Acute respiratory failure with hypoxia - likely multifactorial combination of new diagnosis of pneumonia versus atelectasis secondary to bronchial occlusion secondary to mucous plugging. Called and discussed with pulmonary. Dr [place] Bells has seen  the patient. Continue vancomycin, cefepime. Will await sputum culture results.  . Asthma-  does not appear to have any wheezing we will continue when necessary albuterol and  DuoNeb  . Decubitus ulcer of sacral region, stage 4 ([place]) order wound consult   . Malnutrition of moderate degree- started on tube feeding 11pm --3 pm.  . HCAP (healthcare-associated pneumonia) - admit for broad coverage antibiotic coverage to evaluate for aspiration Likely need to be discharged home with home oxygen when necessary    DVT prophylaxis: Lovenox  Code Status: Full code  Family Communication: No family at bedside  Disposition Plan: Pending improvement in respiratory status   Consultants:  None   Procedures:  None   Continuous infusions . sodium chloride 75 mL/hr at 01/21/16 0946      Antibiotics:   Anti-infectives    Start     Dose/Rate Route Frequency Ordered Stop   01/20/16 1400  ceFEPIme (MAXIPIME) 1 g in dextrose 5 % 50 mL IVPB     1 g 100 mL/hr over 30 Minutes Intravenous Every 12 hours 01/20/16 1114     01/20/16 1100  vancomycin (VANCOCIN) 500 mg in sodium chloride 0.9 % 100 mL IVPB      500 mg 100 mL/hr over 60 Minutes Intravenous Every 12 hours 01/19/16 2328     01/20/16 0600  aztreonam (AZACTAM) 2 g in dextrose 5 % 50 mL IVPB  Status:  Discontinued     2 g 100 mL/hr over 30 Minutes Intravenous Every 8 hours 01/19/16 2313 01/20/16 1109   01/19/16 2115  vancomycin (VANCOCIN) IVPB 1000 mg/200 mL premix     1,000 mg 200 mL/hr over 60 Minutes Intravenous  Once 01/19/16 2110 01/19/16 2342   01/19/16 2100  levofloxacin (LEVAQUIN) IVPB 750 mg     750 mg 100 mL/hr over 90 Minutes Intravenous  Once 01/19/16 2048 01/19/16 2240       Objective   Vitals:   01/20/16 2342 01/21/16 0447 01/21/16 0834 01/21/16 1148  BP:  (!) 94/53    Pulse: 79 84    Resp: 18 18    Temp:  99.1 F (37.3 C)  99.2 F (37.3 C)  TempSrc:  Oral  Oral  SpO2: 93% 95% 94%   Weight:      Height:        Intake/Output Summary (Last 24 hours) at 01/21/16 1337 Last data filed at 01/21/16 1000  Gross per 24 hour  Intake             2559 ml  Output             1600 ml  Net              959 ml  Filed Weights   01/19/16 2324 01/20/16 0200  Weight: 43.1 kg (95 lb 0.3 oz) 43.1 kg (95 lb 0.3 oz)     Physical Examination:  General exam: Appears calm and comfortable. Respiratory system: Trach collar in place, Clear to auscultation. Respiratory effort normal. Cardiovascular system:  RRR. No  murmurs, rubs, gallops. No pedal edema. GI system: Abdomen is nondistended, soft and nontender. No organomegaly.  Central nervous system. No focal neurological deficits. 5 x 5 power in all extremities. Skin: No rashes, lesions or ulcers. Psychiatry: Alert, oriented x 3.Judgement and insight appear normal. Affect normal.    Data Reviewed: I have personally reviewed following labs and imaging studies  CBG: No results for input(s): GLUCAP in the last 168 hours.  CBC:  Recent Labs Lab 01/19/16 1535 01/20/16 0108 01/21/16 0429  WBC 7.4 4.8 4.5  NEUTROABS 5.5 3.0  --   HGB 13.3 11.7* 11.5*  HCT  40.5 37.1 35.3*  MCV 94.2 92.5 94.1  PLT 243 215 0000000    Basic Metabolic Panel:  Recent Labs Lab 01/19/16 1535 01/20/16 0108 01/21/16 0429  NA 133* 135 138  K 4.5 4.1 4.2  CL 95* 98* 102  CO2 28 28 28   GLUCOSE 110* 90 150*  BUN 26* 23* 16  CREATININE 0.35* 0.33* 0.32*  CALCIUM 9.6 9.8 9.5    Recent Results (from the past 240 hour(s))  Blood culture (routine x 2)     Status: None (Preliminary result)   Collection Time: 01/19/16  3:39 PM  Result Value Ref Range Status   Specimen Description BLOOD LEFT ANTECUBITAL  Final   Special Requests BOTTLES DRAWN AEROBIC AND ANAEROBIC 5CC EACH  Final   Culture  Setup Time   Final    GRAM POSITIVE COCCI IN CLUSTERS AEROBIC BOTTLE ONLY Organism ID to follow CRITICAL RESULT CALLED TO, READ BACK BY AND VERIFIED WITH: A. [place], [place] ON [id number] BY Rhea Bleacher    Culture   Final    TOO YOUNG TO READ Performed at [facility]    Report Status PENDING  Incomplete  Blood Culture ID Panel (Reflexed)     Status: Abnormal   Collection Time: 01/19/16  3:39 PM  Result Value Ref Range Status   Enterococcus species NOT DETECTED NOT DETECTED Final   Listeria monocytogenes NOT DETECTED NOT DETECTED Final   Staphylococcus species DETECTED (A) NOT DETECTED Final    Comment: CRITICAL RESULT CALLED TO, READ BACK BY AND VERIFIED WITH: A. PHAM, PHARM AT 0841 ON [id number] BY S. YARBROUGH    Staphylococcus aureus NOT DETECTED NOT DETECTED Final   Methicillin resistance DETECTED (A) NOT DETECTED Final    Comment: CRITICAL RESULT CALLED TO, READ BACK BY AND VERIFIED WITH: A. PHAM, PHARM AT 0841 ON [id number] BY S. YARBROUGH    Streptococcus species NOT DETECTED NOT DETECTED Final   Streptococcus agalactiae NOT DETECTED NOT DETECTED Final   Streptococcus pneumoniae NOT DETECTED NOT DETECTED Final   Streptococcus pyogenes NOT DETECTED NOT DETECTED Final   Acinetobacter baumannii NOT DETECTED NOT DETECTED Final   Enterobacteriaceae species NOT  DETECTED NOT DETECTED Final   Enterobacter cloacae complex NOT DETECTED NOT DETECTED Final   Escherichia coli NOT DETECTED NOT DETECTED Final   Klebsiella oxytoca NOT DETECTED NOT DETECTED Final   Klebsiella pneumoniae NOT DETECTED NOT DETECTED Final   Proteus species NOT DETECTED NOT DETECTED Final   Serratia marcescens NOT DETECTED NOT DETECTED Final   Haemophilus influenzae NOT DETECTED NOT DETECTED Final  Neisseria meningitidis NOT DETECTED NOT DETECTED Final   Pseudomonas aeruginosa NOT DETECTED NOT DETECTED Final   Candida albicans NOT DETECTED NOT DETECTED Final   Candida glabrata NOT DETECTED NOT DETECTED Final   Candida krusei NOT DETECTED NOT DETECTED Final   Candida parapsilosis NOT DETECTED NOT DETECTED Final   Candida tropicalis NOT DETECTED NOT DETECTED Final    Comment: Performed at [facility]  Culture, respiratory (NON-Expectorated)     Status: None (Preliminary result)   Collection Time: 01/20/16 12:44 AM  Result Value Ref Range Status   Specimen Description TRACHEAL SITE  Final   Special Requests NONE  Final   Gram Stain   Final    RARE WBC PRESENT, PREDOMINANTLY PMN RARE SQUAMOUS EPITHELIAL CELLS PRESENT FEW GRAM NEGATIVE COCCI RARE GRAM POSITIVE RODS RARE GRAM POSITIVE COCCI IN PAIRS Performed at [facility]  Final   Report Status PENDING  Incomplete     Liver Function Tests:  Recent Labs Lab 01/19/16 1535 01/20/16 0108 01/21/16 0429  AST 81* 63* 47*  ALT 85* 77* 57*  ALKPHOS 205* 176* 147*  BILITOT 0.4 0.5 0.4  PROT 7.6 7.1 6.7  ALBUMIN 3.9 3.5 3.3*   No results for input(s): LIPASE, AMYLASE in the last 168 hours. No results for input(s): AMMONIA in the last 168 hours.  Cardiac Enzymes: No results for input(s): CKTOTAL, CKMB, CKMBINDEX, TROPONINI in the last 168 hours. BNP (last 3 results)  Recent Labs  01/09/16 1612 01/19/16 1535  BNP 30.4 27.4    ProBNP (last 3 results) No  results for input(s): PROBNP in the last 8760 hours.    Studies: Dg Chest 2 View  Result Date: 01/19/2016 CLINICAL DATA:  Worsening O2 saturation this morning. EXAM: CHEST  2 VIEW COMPARISON:  January 09, 2016 FINDINGS: The heart size and mediastinal contours are stable. Tracheostomy tube is unchanged. There is right pleural effusion with consolidation right lung base unchanged. There is mild interstitial edema. The visualized skeletal structures are stable. IMPRESSION: Persistent right pleural effusion with consolidation right lung base unchanged. Mild interstitial edema. Electronically Signed   By: Abelardo Diesel M.D.   On: 01/19/2016 16:32   Ct Angio Chest Pe W And/or Wo Contrast  Result Date: 01/19/2016 CLINICAL DATA:  Shortness of breath. EXAM: CT ANGIOGRAPHY CHEST WITH CONTRAST TECHNIQUE: Multidetector CT imaging of the chest was performed using the standard protocol during bolus administration of intravenous contrast. Multiplanar CT image reconstructions and MIPs were obtained to evaluate the vascular anatomy. CONTRAST:  100 mL of Isovue 370 intravenously. COMPARISON:  Radiographs of same day.  CT scan of November 01, 2015. FINDINGS: Cardiovascular: There is no evidence of thoracic aortic dissection or aneurysm. There is no definite evidence of pulmonary embolus. Mediastinum/Nodes: Tracheostomy tube is in grossly good position. Left-sided pacemaker is unchanged in position. 1.2 cm precarinal lymph node is noted. Thyroid gland is unremarkable. Lungs/Pleura: No pneumothorax is noted. Right lower lobe atelectasis or infiltrate is noted, which appears to be due to bronchial occlusion or mucous plugging. Right upper lobe airspace opacity is noted concerning for pneumonia. Upper Abdomen: No acute abnormality. Musculoskeletal: No chest wall abnormality. No acute or significant osseous findings. Review of the MIP images confirms the above findings. IMPRESSION: No definite evidence of pulmonary embolus.  Tracheostomy tube in grossly good position. 1.2 cm precarinal lymph node is noted which is enlarged compared to prior exam. It is uncertain if this is inflammatory or neoplastic in  origin; clinical correlation is recommended. Right upper lobe airspace opacity is noted concerning for pneumonia. Right lower lobe atelectasis or infiltrate is noted, which appears to be due to occlusion of lower lobe bronchus, potentially due to mucous plugging. Bronchoscopy is recommended for further evaluation. Electronically Signed   By: Marijo Conception, M.D.   On: 01/19/2016 20:39   Dg Chest Port 1 View  Result Date: 01/21/2016 CLINICAL DATA:  Healthcare associated pneumonia. EXAM: PORTABLE CHEST 1 VIEW COMPARISON:  Chest x-rays dated 01/19/2016 and 01/09/2016 and 11/23/2015 and CT scan dated 01/19/2016 FINDINGS: There is persistent marked atelectasis of the right lower lobe with a tiny adjacent effusion. Heart size and pulmonary vascularity are normal. The patchy infiltrate in the right upper lobe seen on the CT scan is not appreciable on the current exam. Tracheostomy tube and pacemaker appear unchanged. IMPRESSION: Persistent atelectasis of the majority of the right lower lobe with a small associated pleural effusion. Right upper lobe infiltrate appears to have cleared. Electronically Signed   By: Lorriane Shire M.D.   On: 01/21/2016 11:01    Scheduled Meds: . baclofen  20 mg Per Tube TID  . ceFEPime (MAXIPIME) IV  1 g Intravenous Q12H  . chlorhexidine  15 mL Mouth Rinse BID  . DULoxetine  30 mg Oral BID  . enoxaparin (LOVENOX) injection  30 mg Subcutaneous Q24H  . feeding supplement (OSMOLITE 1.5 CAL)  1,000 mL Per Tube Q24H  . free water  100 mL Per Tube Q8H  . gabapentin  400 mg Per Tube QID  . guaiFENesin  15 mL Oral QID  . ipratropium-albuterol  3 mL Nebulization TID  . mouth rinse  15 mL Mouth Rinse q12n4p  . midodrine  10 mg Per Tube TID WC  . tiZANidine  4 mg Per Tube TID  . vancomycin  500 mg  Intravenous Q12H      Time spent: 25 min  [facility] Pager [phone]. If 7PM-7AM, please contact night-coverage at [email or web address], Office  [phone]  password [email or web address] 01/21/2016, 1:37 PM  LOS: 2 days

## 2016-01-21 NOTE — Progress Notes (Signed)
PHARMACY - PHYSICIAN COMMUNICATION CRITICAL VALUE ALERT - BLOOD CULTURE IDENTIFICATION (BCID)  Results for orders placed or performed during the hospital encounter of 09/22/15  Blood Culture ID Panel (Reflexed) (Collected: 09/22/2015  1:40 PM)  Result Value Ref Range   Enterococcus species NOT DETECTED NOT DETECTED   Listeria monocytogenes NOT DETECTED NOT DETECTED   Staphylococcus species DETECTED (A) NOT DETECTED   Staphylococcus aureus NOT DETECTED NOT DETECTED   Methicillin resistance DETECTED (A) NOT DETECTED   Streptococcus species NOT DETECTED NOT DETECTED   Streptococcus agalactiae NOT DETECTED NOT DETECTED   Streptococcus pneumoniae NOT DETECTED NOT DETECTED   Streptococcus pyogenes NOT DETECTED NOT DETECTED   Acinetobacter baumannii NOT DETECTED NOT DETECTED   Enterobacteriaceae species NOT DETECTED NOT DETECTED   Enterobacter cloacae complex NOT DETECTED NOT DETECTED   Escherichia coli NOT DETECTED NOT DETECTED   Klebsiella oxytoca NOT DETECTED NOT DETECTED   Klebsiella pneumoniae NOT DETECTED NOT DETECTED   Proteus species NOT DETECTED NOT DETECTED   Serratia marcescens NOT DETECTED NOT DETECTED   Haemophilus influenzae NOT DETECTED NOT DETECTED   Neisseria meningitidis NOT DETECTED NOT DETECTED   Pseudomonas aeruginosa NOT DETECTED NOT DETECTED   Candida albicans NOT DETECTED NOT DETECTED   Candida glabrata NOT DETECTED NOT DETECTED   Candida krusei NOT DETECTED NOT DETECTED   Candida parapsilosis NOT DETECTED NOT DETECTED   Candida tropicalis NOT DETECTED NOT DETECTED    Name of physician (or Provider) Contacted: Lama  Changes to prescribed antibiotics required: none, already on vancomycin  Jamerius Boeckman A 01/21/2016  8:43 AM

## 2016-01-22 ENCOUNTER — Inpatient Hospital Stay (HOSPITAL_COMMUNITY): Payer: Medicaid Other

## 2016-01-22 MED ORDER — LIP MEDEX EX OINT
TOPICAL_OINTMENT | CUTANEOUS | Status: AC
  Administered 2016-01-22: 16:00:00
  Filled 2016-01-22: qty 7

## 2016-01-22 MED ORDER — SODIUM CHLORIDE 0.9 % IV SOLN
1.0000 g | Freq: Three times a day (TID) | INTRAVENOUS | Status: DC
  Administered 2016-01-22 – 2016-01-23 (×3): 1 g via INTRAVENOUS
  Filled 2016-01-22 (×5): qty 1

## 2016-01-22 NOTE — Progress Notes (Signed)
Pharmacy Antibiotic Note  Angela Page is a 46 y.o. female admitted on 01/19/2016 with sepsis in setting of pneumonia and UTI.  PMH significant for traumatic spinal injury with paraplegia, recurrent UTIs, decubitus ulcers, tracheotomy.  Patient currently receiving empiric vancomycin and aztreonam. Although chart lists PCN allergy with reaction "hives", patient has tolerated cephalosporins previously, including cefepime during admission in Sept 2017.  Contacted Dr. Darrick Meigs and suggested switch from aztreonam to cefepime, received orders for same.  (Cefepime provides both gram-positive and gram-negative coverage, thereby facilitating future potential de-escalation).  On 1/2 trach aspirate growing acinetobacter, discussed and received orders to start carbapenem while pending culture susceptibilities and stop vancomycin therapy.    Plan:  D/C vancomycin and Cefepime   Meropenem 1gm IV q8h pending final sensitivities  F/U cultures, renal function, clinical course; consider de-escalation when feasible  Height: 5\' 5"  (165.1 cm) Weight: 95 lb 0.3 oz (43.1 kg) IBW/kg (Calculated) : 57  Temp (24hrs), Avg:98.8 F (37.1 C), Min:98.6 F (37 C), Max:99.1 F (37.3 C)   Recent Labs Lab 01/19/16 1535 01/19/16 2301 01/20/16 0108 01/21/16 0429  WBC 7.4  --  4.8 4.5  CREATININE 0.35*  --  0.33* 0.32*  LATICACIDVEN  --  0.97  --   --     Estimated Creatinine Clearance: 60.4 mL/min (by C-G formula based on SCr of 0.32 mg/dL (L)).    Allergies  Allergen Reactions  . Erythromycin Nausea And Vomiting  . Nitrofurantoin Monohyd Macro Nausea And Vomiting  . Penicillins Hives    Has patient had a PCN reaction causing immediate rash, facial/tongue/throat swelling, SOB or lightheadedness with hypotension: Yes Has patient had a PCN reaction causing severe rash involving mucus membranes or skin necrosis: Yes Has patient had a PCN reaction that required hospitalization Yes- went to the DR Has patient had a  PCN reaction occurring within the last 10 years: No-childhood allergy If all of the above answers are "NO", then may proceed with Cephalosporin use.     Antimicrobials this admission: 12/30 Levofloxacin x 1 12/30 Vanc >> 1/2 12/30 Aztreonam >>12/31 12/31 Cefepime >> 1/2 1/2 meropenem >>  Dose adjustments this admission:  --  Microbiology results:  12/30 BCx (1x): staph spp. (BCID shows MR- CoNS) 12/31 BCx (1x): NGTD 12/31 Trach Cx: ACBC - susc pending  Thank you for allowing pharmacy to be a part of this patient's care.  Doreene Eland, PharmD, BCPS.   Pager: [phone] 01/22/2016 1:55 PM

## 2016-01-22 NOTE — Progress Notes (Signed)
Patient active with [facility] prior to admission. Contacted [facility] to confirm services, active with nursing. They will be able to provide chest PT, patient with trach but will need O2 as well at d/c, nurse to obtain qualifying sats.

## 2016-01-22 NOTE — Progress Notes (Signed)
Initial Nutrition Assessment  DOCUMENTATION CODES:   Underweight  INTERVENTION:   If RD to manage TF , will need consult for TF management.  Recommend continue Osmolite 1.5 @ 90 ml/hr over 16 hours (2300-1500).  Continue free water of 100 ml every 8 hours (300 ml) This provides 2160 kcal (110% of needs), 90g protein (100% of needs) and 1397 ml H2O.  RD will continue to follow.  NUTRITION DIAGNOSIS:   Inadequate oral intake related to dysphagia as evidenced by other (see comment) (full liquid diet and need for tube feeds).  GOAL:   Patient will meet greater than or equal to 90% of their needs  MONITOR:   Labs, Weight trends, TF tolerance, I & O's, Skin  REASON FOR ASSESSMENT:   Consult Assessment of nutrition requirement/status  ASSESSMENT:   46 y.o. female with medical history significant of traumatic spinal injury with paraplegia s/p fall with C2-7 fusion, recurrent UTIs, decubitus ulcers, pacemaker for sick sinus syndrome, tobacco abuse, GERD, and tracheotomy s/p ICU admission for respiratory failure due to PNA/UTI sepsis   Patient not in room at time of visit. Pt having MBS. Per SLP evaluation 1/1: recommended MBS, pt with moderate aspiration risk, continue FLD. Will monitor for results of MBS.  Patient was followed by nutrition team during previous admission in [place]. Pt was discharged on PEG tube feedings, on Osmolite 1.2 @ 90 ml/hr over 16 hours w/ 30 ml Prostat BID. 100 ml free water flushes QID. This provided 1928 kcal, 110 g protein, and 1581 ml H2O.  Current regimen is Osmolite 1.5 @ 90 ml/hr over 16 hours. 100 ml free water flushes every 8 hours. This provides 2160 kcal, 90g protein and 1397 ml H2O. Will monitor for need for adjustment.  Per chart review, pt's weight has remained stable. Unable to perform NFPE as pt not in room.  Labs reviewed. Medications reviewed.  Diet Order:  Diet full liquid Room service appropriate? Yes; Fluid consistency:  Thin  Skin:  Wound (see comment) (Stg III pressure injuries on buttocks, Stg IV sacral ulcer)  Last BM:  1/1  Height:   Ht Readings from Last 1 Encounters:  01/20/16 5\' 5"  (1.651 m)    Weight:   Wt Readings from Last 1 Encounters:  01/20/16 95 lb 0.3 oz (43.1 kg)    Ideal Body Weight:  56.8 kg  BMI:  Body mass index is 15.81 kg/m.  Estimated Nutritional Needs:   Kcal:  [id number]  Protein:  85-95g  Fluid:  1.8-2L/day  EDUCATION NEEDS:   No education needs identified at this time  Clayton Bibles, MS, RD, LDN Pager: [phone] After Hours Pager: [phone]

## 2016-01-22 NOTE — Progress Notes (Signed)
Modified Barium Swallow Progress Note  Patient Details  Name: Angela Page MRN: [id number] Date of Birth: [birth year]  Today's Date: 01/22/2016  Modified Barium Swallow completed.  Full report located under Chart Review in the Imaging Section.  Brief recommendations include the following:  Clinical Impression  Mild oropharyngeal dysphagia with suspected primary esophageal deficits. NO aspiration or penetration of any consistency noted.  Pt did have decreased oral propulsion with premature spillage of boluses.  Pharyngeal swallow mildly weak with resultant vallecular residuals (worse with thicker visocity than liquids) that she does not fully sense.  Following solid with liquids and intermittent dry swallow helpful.  In addition, cueing pt to "hock" helpful to clear secretions from vallecular space.  Appearance of decreased clearance *(worse with increased viscocity) with retrograde propulsion during MBS WITHOUT pt sensation, only able to cursory view proximal esophagus;  Radiologist not present to confirm.   Pt admits to doing well at home until eating an entire plate of spaghetti - rec continue full liquids with strict precautions.  Of note, pt was tested at approx 45* as that is how high she sits at home due to her decub - this may impact aspiration risk.       Swallow Evaluation Recommendations       SLP Diet Recommendations: Thin liquid (full liquids)   Liquid Administration via: Cup;Straw   Medication Administration: Whole meds with liquid (nectar thick- Ensure)       Compensations: Slow rate;Small sips/bites;Follow solids with liquid;Other (Comment) (intermittent dry swallow)   Postural Changes: Seated upright at 90 degrees;Remain semi-upright after after feeds/meals (Comment)   Oral Care Recommendations: Oral care BID       [name], [place] [facility] SLP [phone]  [name] 01/22/2016,12:17 PM

## 2016-01-22 NOTE — Progress Notes (Signed)
[facility]  PROGRESS NOTE  Angela Page [id number] DOB: Feb 28, 1970 DOA: 01/19/2016 PCP: PROVIDER NOT IN SYSTEM   Brief HPI:    46 y.o. 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 Respiratory culture is growing Acinetobacter. Final sensitivities pending.   Assessment/Plan:     Acute respiratory failure with hypoxia - likely multifactorial combination of new diagnosis of pneumonia versus atelectasis secondary to bronchial occlusion secondary to mucous plugging. Called and discussed with pulmonary. Dr Lake Bells has seen  the patient. Continue Merrem. Will await respiratory  culture results.   Asthma-  does not appear to have any wheezing we will continue when necessary albuterol and  DuoNeb   Decubitus ulcer of sacral region, stage 4 (HCC) order wound consult   Malnutrition of moderate degree- started on tube feeding 11pm --3 pm.   HCAP (healthcare-associated pneumonia) - admit for broad coverage antibiotic coverage to evaluate for aspiration Likely need to be discharged home with home oxygen when necessary    DVT prophylaxis: Lovenox  Code Status: Full code  Family Communication: No family at bedside  Disposition Plan: Pending improvement in respiratory status   Consultants:  None   Procedures:  None   Continuous infusions . sodium chloride 75 mL/hr at 01/22/16 0132      Antibiotics:   Anti-infectives    Start     Dose/Rate Route Frequency Ordered Stop   01/22/16 1500  meropenem (MERREM) 1 g in sodium chloride 0.9 % 100 mL IVPB     1 g 200 mL/hr over 30 Minutes Intravenous Every 8 hours 01/22/16 1349     01/20/16 1400  ceFEPIme  (MAXIPIME) 1 g in dextrose 5 % 50 mL IVPB  Status:  Discontinued     1 g 100 mL/hr over 30 Minutes Intravenous Every 12 hours 01/20/16 1114 01/22/16 1348   01/20/16 1100  vancomycin (VANCOCIN) 500 mg in sodium chloride 0.9 % 100 mL IVPB  Status:  Discontinued     500 mg 100 mL/hr over 60 Minutes Intravenous Every 12 hours 01/19/16 2328 01/22/16 1348   01/20/16 0600  aztreonam (AZACTAM) 2 g in dextrose 5 % 50 mL IVPB  Status:  Discontinued     2 g 100 mL/hr over 30 Minutes Intravenous Every 8 hours 01/19/16 2313 01/20/16 1109   01/19/16 2115  vancomycin (VANCOCIN) IVPB 1000 mg/200 mL premix     1,000 mg 200 mL/hr over 60 Minutes Intravenous  Once 01/19/16 2110 01/19/16 2342   01/19/16 2100  levofloxacin (LEVAQUIN) IVPB 750 mg     750 mg 100 mL/hr over 90 Minutes Intravenous  Once 01/19/16 2048 01/19/16 2240       Objective   Vitals:   01/22/16 0824 01/22/16 1229 01/22/16 1359 01/22/16 1407  BP:   (!) 135/91   Pulse: 92 80 80   Resp: 16 15 17    Temp:   98.6 F (37 C)   TempSrc:   Oral   SpO2: 95% 94% 97% 99%  Weight:      Height:        Intake/Output Summary (Last 24 hours) at 01/22/16 1622 Last data filed at 01/22/16 1403  Gross per 24 hour  Intake  4310 ml  Output             2925 ml  Net             1385 ml   Filed Weights   01/19/16 2324 01/20/16 0200  Weight: 43.1 kg (95 lb 0.3 oz) 43.1 kg (95 lb 0.3 oz)     Physical Examination:  General exam: Appears calm and comfortable. Respiratory system: Trach collar in place, Clear to auscultation. Respiratory effort normal. Cardiovascular system:  RRR. No  murmurs, rubs, gallops. No pedal edema. GI system: Abdomen is nondistended, soft and nontender. No organomegaly.  Central nervous system. No focal neurological deficits. 5 x 5 power in all extremities. Skin: No rashes, lesions or ulcers. Psychiatry: Alert, oriented x 3.Judgement and insight appear normal. Affect normal.    Data Reviewed: I have  personally reviewed following labs and imaging studies  CBG: No results for input(s): GLUCAP in the last 168 hours.  CBC:  Recent Labs Lab 01/19/16 1535 01/20/16 0108 01/21/16 0429  WBC 7.4 4.8 4.5  NEUTROABS 5.5 3.0  --   HGB 13.3 11.7* 11.5*  HCT 40.5 37.1 35.3*  MCV 94.2 92.5 94.1  PLT 243 215 0000000    Basic Metabolic Panel:  Recent Labs Lab 01/19/16 1535 01/20/16 0108 01/21/16 0429  NA 133* 135 138  K 4.5 4.1 4.2  CL 95* 98* 102  CO2 28 28 28   GLUCOSE 110* 90 150*  BUN 26* 23* 16  CREATININE 0.35* 0.33* 0.32*  CALCIUM 9.6 9.8 9.5    Recent Results (from the past 240 hour(s))  Blood culture (routine x 2)     Status: Abnormal (Preliminary result)   Collection Time: 01/19/16  3:39 PM  Result Value Ref Range Status   Specimen Description BLOOD LEFT ANTECUBITAL  Final   Special Requests BOTTLES DRAWN AEROBIC AND ANAEROBIC 5CC EACH  Final   Culture  Setup Time   Final    GRAM POSITIVE COCCI IN CLUSTERS AEROBIC BOTTLE ONLY Organism ID to follow CRITICAL RESULT CALLED TO, READ BACK BY AND VERIFIED WITH: A. PHAM, PHARM AT [place] ON [id number] BY Rhea Bleacher Performed at [facility] (COAGULASE NEGATIVE) (A)  Final   Report Status PENDING  Incomplete  Blood Culture ID Panel (Reflexed)     Status: Abnormal   Collection Time: 01/19/16  3:39 PM  Result Value Ref Range Status   Enterococcus species NOT DETECTED NOT DETECTED Final   Listeria monocytogenes NOT DETECTED NOT DETECTED Final   Staphylococcus species DETECTED (A) NOT DETECTED Final    Comment: CRITICAL RESULT CALLED TO, READ BACK BY AND VERIFIED WITH: A. PHAM, PHARM AT 0841 ON [id number] BY S. YARBROUGH    Staphylococcus aureus NOT DETECTED NOT DETECTED Final   Methicillin resistance DETECTED (A) NOT DETECTED Final    Comment: CRITICAL RESULT CALLED TO, READ BACK BY AND VERIFIED WITH: A. PHAM, PHARM AT 0841 ON [id number] BY S. YARBROUGH    Streptococcus species NOT DETECTED  NOT DETECTED Final   Streptococcus agalactiae NOT DETECTED NOT DETECTED Final   Streptococcus pneumoniae NOT DETECTED NOT DETECTED Final   Streptococcus pyogenes NOT DETECTED NOT DETECTED Final   Acinetobacter baumannii NOT DETECTED NOT DETECTED Final   Enterobacteriaceae species NOT DETECTED NOT DETECTED Final   Enterobacter cloacae complex NOT DETECTED NOT DETECTED Final   Escherichia coli NOT DETECTED NOT DETECTED Final   Klebsiella oxytoca NOT DETECTED NOT DETECTED Final   Klebsiella pneumoniae NOT  DETECTED NOT DETECTED Final   Proteus species NOT DETECTED NOT DETECTED Final   Serratia marcescens NOT DETECTED NOT DETECTED Final   Haemophilus influenzae NOT DETECTED NOT DETECTED Final   Neisseria meningitidis NOT DETECTED NOT DETECTED Final   Pseudomonas aeruginosa NOT DETECTED NOT DETECTED Final   Candida albicans NOT DETECTED NOT DETECTED Final   Candida glabrata NOT DETECTED NOT DETECTED Final   Candida krusei NOT DETECTED NOT DETECTED Final   Candida parapsilosis NOT DETECTED NOT DETECTED Final   Candida tropicalis NOT DETECTED NOT DETECTED Final    Comment: Performed at [facility]  Culture, respiratory (NON-Expectorated)     Status: None (Preliminary result)   Collection Time: 01/20/16 12:44 AM  Result Value Ref Range Status   Specimen Description TRACHEAL SITE  Final   Special Requests NONE  Final   Gram Stain   Final    RARE WBC PRESENT, PREDOMINANTLY PMN RARE SQUAMOUS EPITHELIAL CELLS PRESENT FEW GRAM NEGATIVE COCCI RARE GRAM POSITIVE RODS RARE GRAM POSITIVE COCCI IN PAIRS    Culture   Final    ABUNDANT ACINETOBACTER CALCOACETICUS/BAUMANNII COMPLEX REPEATING SUSCEPTIBILITIES Performed at [facility]    Report Status PENDING  Incomplete  Blood culture (routine x 2)     Status: None (Preliminary result)   Collection Time: 01/20/16  1:08 AM  Result Value Ref Range Status   Specimen Description BLOOD LEFT HAND  Final   Special Requests BOTTLES DRAWN  AEROBIC ONLY  5CC  Final   Culture   Final    NO GROWTH 2 DAYS Performed at [facility]    Report Status PENDING  Incomplete     Liver Function Tests:  Recent Labs Lab 01/19/16 1535 01/20/16 0108 01/21/16 0429  AST 81* 63* 47*  ALT 85* 77* 57*  ALKPHOS 205* 176* 147*  BILITOT 0.4 0.5 0.4  PROT 7.6 7.1 6.7  ALBUMIN 3.9 3.5 3.3*   No results for input(s): LIPASE, AMYLASE in the last 168 hours. No results for input(s): AMMONIA in the last 168 hours.  Cardiac Enzymes: No results for input(s): CKTOTAL, CKMB, CKMBINDEX, TROPONINI in the last 168 hours. BNP (last 3 results)  Recent Labs  01/09/16 1612 01/19/16 1535  BNP 30.4 27.4    ProBNP (last 3 results) No results for input(s): PROBNP in the last 8760 hours.    Studies: Dg Chest Port 1 View  Result Date: 01/21/2016 CLINICAL DATA:  Healthcare associated pneumonia. EXAM: PORTABLE CHEST 1 VIEW COMPARISON:  Chest x-rays dated 01/19/2016 and 01/09/2016 and 11/23/2015 and CT scan dated 01/19/2016 FINDINGS: There is persistent marked atelectasis of the right lower lobe with a tiny adjacent effusion. Heart size and pulmonary vascularity are normal. The patchy infiltrate in the right upper lobe seen on the CT scan is not appreciable on the current exam. Tracheostomy tube and pacemaker appear unchanged. IMPRESSION: Persistent atelectasis of the majority of the right lower lobe with a small associated pleural effusion. Right upper lobe infiltrate appears to have cleared. Electronically Signed   By: Lorriane Shire M.D.   On: 01/21/2016 11:01   Dg Swallowing Func-speech Pathology  Result Date: 01/22/2016 Objective Swallowing Evaluation: Type of Study: Bedside Swallow Evaluation Patient Details Name: Angela Page MRN: [id number] Date of Birth: December 09, 1970 Today's Date: 01/22/2016 Time: SLP Start Time (ACUTE ONLY): 1046-SLP Stop Time (ACUTE ONLY): 1123 SLP Time Calculation (min) (ACUTE ONLY): 37 min Past Medical History: Past Medical  History: Diagnosis Date . Asthma  . GERD (gastroesophageal reflux disease)  .  Pacemaker  . Pleurisy  . Protein calorie malnutrition ([place]) 10/31/2015 . Quadriparesis ([place])  . Quadriplegia, C5-C7 incomplete ([place]) 10/08/2013 . Stage IV pressure ulcer of sacral region [facility]) 10/31/2015 Past Surgical History: Past Surgical History: Procedure Laterality Date . APPENDECTOMY   . BACK SURGERY  10/08/2013  fusion of spine, cervical region . CARDIAC SURGERY   . GASTROSTOMY  11/23/2015 . I&D EXTREMITY  10/28/2011  Procedure: IRRIGATION AND DEBRIDEMENT EXTREMITY;  Surgeon: Tennis Must, MD;  Location: [place];  Service: Orthopedics;  Laterality: Right;  Irrigation and debridement right middle finger . IR GENERIC HISTORICAL  11/23/2015  IR GASTROSTOMY TUBE MOD SED 11/23/2015 [facility]-INTERV RAD . TRACHEOSTOMY   HPI: Peja Eblin a 46 y.o.femalewith medical history significant of traumatic spinal injury with paraplegia s/p fall with C2-7 fusion, recurrent UTIs, decubitus ulcers, pacemaker for sick sinus syndrome, tobacco abuse, GERD, and tracheotomy s/p ICU admission for respiratory failure due to PNA/UTI sepsis.  Presented with intermittent shortness of breath is worse in Am gets better when she uses oxygen.  Patient continues to chain smoke she has had numerous admissions in the past including 1 months ago. For pneumonia. She had a visit to emergency department on December 20 for shortness of breath with oxygen saturation down to 86% she was diagnosed with questionable pneumonia treated with clindamycin and albuterol when necessary and discharged to home. At home she's been using oxygen from her mom says she is herself does not have oxygen prescription. While in oxygen her oxygen saturation improved to 97%.  Patient known to ST service from previous admission with previous MBS which found significant esopahgeal issues.  She currently has a trach and PEG in place and the patient reported that she recieves most  of her nutrition via the PEG and just eats a little bit extra during the day.  Most recent chest xray is showing persistent right pleural effusion with consolidation right lung base unchanged. Subjective: pt seen in xray Assessment / Plan / Recommendation CHL IP CLINICAL IMPRESSIONS 01/22/2016 Therapy Diagnosis Mild oral phase dysphagia;Mild pharyngeal phase dysphagia;Suspected primary esophageal dysphagia Clinical Impression Mild oropharyngeal dysphagia with suspected primary esophageal deficits. NO aspiration or penetration of any consistency noted.  Pt did have decreased oral propulsion with premature spillage of boluses.  Pharyngeal swallow mildly weak with resultant vallecular residuals (worse with thicker visocity than liquids) that she does not fully sense.  Following solid with liquids and intermittent dry swallow helpful.  In addition, cueing pt to "hock" helpful to clear secretions from vallecular space.  Appearance of decreased clearance *(worse with increased viscocity) with retrograde propulsion during MBS WITHOUT pt sensation, only able to cursory view proximal esophagus;  Radiologist not present to confirm.   Pt admits to doing well at home until eating an entire plate of spaghetti - rec continue full liquids with strict precautions.  Of note, pt was tested at approx 45* as that is how high she sits at home due to her decub - this may impact aspiration risk.     Impact on safety and function Moderate aspiration risk   CHL IP TREATMENT RECOMMENDATION 01/22/2016 Treatment Recommendations Therapy as outlined in treatment plan below   Prognosis 01/22/2016 Prognosis for Safe Diet Advancement Fair Barriers to Reach Goals Severity of deficits Barriers/Prognosis Comment -- CHL IP DIET RECOMMENDATION 01/22/2016 SLP Diet Recommendations Thin liquid Liquid Administration via Cup;Straw Medication Administration Whole meds with liquid Compensations Slow rate;Small sips/bites;Follow solids with liquid;Other (Comment)  Postural Changes Seated upright at 90  degrees;Remain semi-upright after after feeds/meals (Comment)   CHL IP OTHER RECOMMENDATIONS 01/22/2016 Recommended Consults -- Oral Care Recommendations Oral care BID Other Recommendations --   CHL IP FOLLOW UP RECOMMENDATIONS 01/22/2016 Follow up Recommendations (No Data)   CHL IP FREQUENCY AND DURATION 01/22/2016 Speech Therapy Frequency (ACUTE ONLY) min 1 x/week Treatment Duration 1 week      CHL IP ORAL PHASE 01/22/2016 Oral Phase Impaired Oral - Pudding Teaspoon -- Oral - Pudding Cup -- Oral - Honey Teaspoon -- Oral - Honey Cup -- Oral - Nectar Teaspoon -- Oral - Nectar Cup Premature spillage Oral - Nectar Straw Premature spillage Oral - Thin Teaspoon -- Oral - Thin Cup Premature spillage;Reduced posterior propulsion Oral - Thin Straw Premature spillage;Reduced posterior propulsion Oral - Puree Weak lingual manipulation;Lingual pumping;Reduced posterior propulsion;Piecemeal swallowing;Delayed oral transit Oral - Mech Soft Reduced posterior propulsion;Weak lingual manipulation;Delayed oral transit;Piecemeal swallowing Oral - Regular -- Oral - Multi-Consistency Reduced posterior propulsion Oral - Pill WFL Oral Phase - Comment --  CHL IP PHARYNGEAL PHASE 01/22/2016 Pharyngeal Phase Impaired Pharyngeal- Pudding Teaspoon -- Pharyngeal -- Pharyngeal- Pudding Cup -- Pharyngeal -- Pharyngeal- Honey Teaspoon -- Pharyngeal -- Pharyngeal- Honey Cup -- Pharyngeal -- Pharyngeal- Nectar Teaspoon -- Pharyngeal -- Pharyngeal- Nectar Cup Delayed swallow initiation-vallecula Pharyngeal -- Pharyngeal- Nectar Straw Delayed swallow initiation-vallecula Pharyngeal -- Pharyngeal- Thin Teaspoon -- Pharyngeal -- Pharyngeal- Thin Cup Delayed swallow initiation-vallecula Pharyngeal -- Pharyngeal- Thin Straw Delayed swallow initiation-vallecula Pharyngeal -- Pharyngeal- Puree Reduced tongue base retraction;Pharyngeal residue - valleculae Pharyngeal -- Pharyngeal- Mechanical Soft Reduced tongue base  retraction;Pharyngeal residue - valleculae Pharyngeal -- Pharyngeal- Regular -- Pharyngeal -- Pharyngeal- Multi-consistency WFL Pharyngeal -- Pharyngeal- Pill WFL Pharyngeal -- Pharyngeal Comment --  CHL IP CERVICAL ESOPHAGEAL PHASE 01/22/2016 Cervical Esophageal Phase Impaired Pudding Teaspoon -- Pudding Cup -- Honey Teaspoon -- Honey Cup -- Nectar Teaspoon -- Nectar Cup -- Nectar Straw -- Thin Teaspoon -- Thin Cup -- Thin Straw -- Puree -- Mechanical Soft -- Regular -- Multi-consistency -- Pill -- Cervical Esophageal Comment appearance of decreased clearance *(worse with increased viscocity) with retrograde propulsion during MBS WITHOUT pt sensation, only able to cursory view proximal esophagus;  radiologist not present to confirm No flowsheet data found. [name], MS [facility] SLP [phone]               Scheduled Meds: . baclofen  20 mg Per Tube TID  . chlorhexidine  15 mL Mouth Rinse BID  . DULoxetine  30 mg Oral BID  . enoxaparin (LOVENOX) injection  30 mg Subcutaneous Q24H  . feeding supplement (OSMOLITE 1.5 CAL)  1,000 mL Per Tube Q24H  . free water  100 mL Per Tube Q8H  . gabapentin  400 mg Per Tube QID  . guaiFENesin  15 mL Oral QID  . ipratropium-albuterol  3 mL Nebulization TID  . lip balm      . mouth rinse  15 mL Mouth Rinse q12n4p  . meropenem (MERREM) IV  1 g Intravenous Q8H  . midodrine  10 mg Per Tube TID WC  . tiZANidine  4 mg Per Tube TID      Time spent: 25 min  [facility] Pager [phone]. If 7PM-7AM, please contact night-coverage at [email or web address], Office  [phone]  password [email or web address] 01/22/2016, 4:22 PM  LOS: 3 days

## 2016-01-23 DIAGNOSIS — I5032 Chronic diastolic (congestive) heart failure: Secondary | ICD-10-CM

## 2016-01-23 LAB — CULTURE, RESPIRATORY

## 2016-01-23 LAB — CULTURE, BLOOD (ROUTINE X 2)

## 2016-01-23 LAB — CULTURE, RESPIRATORY W GRAM STAIN

## 2016-01-23 MED ORDER — OXYCODONE HCL 5 MG PO TABS: 5.0000 mg | ORAL_TABLET | Freq: Four times a day (QID) | ORAL | 0 refills | Status: DC | PRN

## 2016-01-23 MED ORDER — FUROSEMIDE 20 MG PO TABS: 20.0000 mg | ORAL_TABLET | ORAL | 2 refills | Status: DC

## 2016-01-23 MED ORDER — CLONAZEPAM 0.5 MG PO TBDP: 0.5000 mg | ORAL_TABLET | Freq: Every day | ORAL | 0 refills | Status: DC

## 2016-01-23 MED ORDER — FUROSEMIDE 20 MG PO TABS: 20.0000 mg | ORAL_TABLET | Freq: Every day | ORAL | 2 refills | Status: DC

## 2016-01-23 NOTE — Discharge Summary (Addendum)
Physician Discharge Summary  Angela Page [id number] DOB: 01-17-1971 DOA: 01/19/2016  PCP: PROVIDER NOT IN SYSTEM  Admit date: 01/19/2016 Discharge date: 01/23/2016  Time spent: 25* minutes  Recommendations for Outpatient Follow-up:  1. Follow up Tracheostomy clinic in 2 weeks 2. Resume home health RN 3. 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 Was off oxygen last night for three hrs. likely multifactorial combination of new diagnosis of pneumonia versus atelectasis secondary to bronchial occlusion secondary to mucous plugging. Called and discussed with pulmonary. Dr Lake Bells has seen  the patient. Patient was empirically started on Merrem, tracheal culture grew Acinetobacter, sensitive to Gentamycin and Imipenem. I called and discussed with Dr Lake Bells, it appears that patient has colonization at this time and no infection. Dr Lake Bells is ok with stopping antibiotics and discharge home, she will follow up with [facility] clinic .  Asthma- does not appear to have any wheezing we will continue when necessary albuterol and  DuoNeb  Decubitus ulcer of sacral region, stage 4  (HCC) order woundconsult  Malnutrition of moderate degree- started on tube feeding 11pm --3 pm.  HCAP (healthcare-associated pneumonia) - resolved.  Chronic diastolic CHF-  Echo showed grade 2 diastolic dysfunction, EF [id number], will start low dose Lasix 20 mg every other day, as it may be contributing to patient's dyspnea.   Procedures:  None   Consultations:  Pulmonary  Discharge Exam: Vitals:   01/23/16 0742 01/23/16 1155  BP:    Pulse: 80 80  Resp: 16 16  Temp:      General: Appears in no acute distress Cardiovascular: RRR, S1S2  Respiratory: Clear to auscultation bilaterally  Discharge Instructions   Discharge Instructions    Diet - low sodium heart healthy    Complete by:  As directed    Increase activity slowly    Complete by:  As directed      Current Discharge Medication List    START taking these medications   Details  furosemide (LASIX) 20 MG tablet Take 1 tablet (20 mg total) by mouth daily. Qty: 30 tablet, Refills: 2      CONTINUE these medications which have CHANGED   Details  clonazePAM (KLONOPIN) 0.5 MG disintegrating tablet Place 1 tablet (0.5 mg total) into feeding tube at bedtime. Qty: 30 tablet, Refills: 0    oxyCODONE (OXY IR/ROXICODONE) 5 MG immediate release tablet Place 1 tablet (5 mg total) into feeding tube every 6 (six) hours as needed for moderate pain or severe pain. Qty: 30 tablet, Refills: 0      CONTINUE these medications which have NOT CHANGED   Details  acetaminophen (TYLENOL) 500 MG tablet Place 1 tablet (500 mg total) into feeding tube  every 6 (six) hours as needed for moderate pain. Qty: 30 tablet, Refills: 0    albuterol (PROVENTIL HFA;VENTOLIN HFA) 108 (90 BASE) MCG/ACT inhaler Inhale 2 puffs into the lungs every 6 (six) hours as needed. For shortness of breath     albuterol (PROVENTIL) (2.5 MG/3ML) 0.083% nebulizer solution Take 2.5 mg by nebulization every 6 (six) hours as needed for wheezing or shortness of  breath.    baclofen (LIORESAL) 20 MG tablet Take 20 mg by mouth 3 (three) times daily. Refills: 3    collagenase (SANTYL) ointment Apply topically daily. Qty: 15 g, Refills: 0    [employer] VITAMIN B12 1000 MCG tablet Place 1 tablet (1,000 mcg total) into feeding tube daily. Qty: 30 tablet, Refills: 0    DULoxetine (CYMBALTA) 30 MG capsule Take 30 mg by mouth 2 (two) times daily.    gabapentin (NEURONTIN) 400 MG capsule Take 400 mg by mouth 4 (four) times daily. Refills: 3    ibuprofen (ADVIL,MOTRIN) 200 MG tablet Place 2 tablets (400 mg total) into feeding tube every 6 (six) hours as needed for moderate pain. Qty: 30 tablet, Refills: 0    lidocaine (LIDODERM) 5 % Place 1 patch onto the skin 2 (two) times daily as needed (pain).  Refills: 0    midodrine (PROAMATINE) 10 MG tablet Place 1 tablet (10 mg total) into feeding tube 3 (three) times daily with meals. Qty: 90 tablet, Refills: 0    neomycin-bacitracin-polymyxin (NEOSPORIN) ointment Apply 1 application topically daily. apply to eye Qty: 15 g, Refills: 0    polyethylene glycol (MIRALAX / GLYCOLAX) packet Place 17 g into feeding tube daily. Qty: 14 each, Refills: 0    ranitidine (ZANTAC) 150 MG/10ML syrup Place 10 mLs (150 mg total) into feeding tube every 12 (twelve) hours. Qty: 300 mL, Refills: 0    silver sulfADIAZINE (SILVADENE) 1 % cream Apply 1 application topically every other day as needed (for wound care).    tiZANidine (ZANAFLEX) 4 MG tablet Take 4 mg by mouth 3 (three) times daily.    zinc oxide (BALMEX) 11.3 % CREA cream Apply 1 application topically 3 (three) times daily as needed (for skin redness/irritation).    Amino Acids-Protein Hydrolys (FEEDING SUPPLEMENT, PRO-STAT SUGAR FREE 64,) LIQD Place 30 mLs into feeding tube 2 (two) times daily. Qty: 900 mL, Refills: 0    Nutritional Supplements (FEEDING SUPPLEMENT, OSMOLITE 1.5 CAL,) LIQD Place 1,000 mLs into feeding tube daily. Qty: 1000 mL, Refills: 0    Water  For Irrigation, Sterile (FREE WATER) SOLN Place 100 mLs into feeding tube every 8 (eight) hours. Qty: 1000 mL, Refills: 0      STOP taking these medications     doxycycline (VIBRA-TABS) 100 MG tablet      bisacodyl (DULCOLAX) 10 MG suppository      gabapentin (NEURONTIN) 250 MG/5ML solution      Melatonin 3 MG TABS      sennosides (SENOKOT) 8.8 MG/5ML syrup      venlafaxine (EFFEXOR) 75 MG tablet        Allergies  Allergen Reactions  . Erythromycin Nausea And Vomiting  . Nitrofurantoin Monohyd Macro Nausea And Vomiting  . Penicillins Hives    Has patient had a PCN reaction causing immediate rash, facial/tongue/throat swelling, SOB or lightheadedness with hypotension: Yes Has patient had a PCN reaction causing severe rash involving mucus membranes or skin necrosis: Yes Has patient had a PCN reaction that required hospitalization Yes- went to the DR Has patient had a  PCN reaction occurring within the last 10 years: No-childhood allergy If all of the above answers are "NO", then may proceed with Cephalosporin use.    Follow-up Information    [facility] Follow up.   Specialty:  [place] Why:  nursing, chest percussion Contact information: [address] [place] [address] [phone]            The results of significant diagnostics from this hospitalization (including imaging, microbiology, ancillary and laboratory) are listed below for reference.    Significant Diagnostic Studies: Dg Chest 2 View  Result Date: 01/19/2016 CLINICAL DATA:  Worsening O2 saturation this morning. EXAM: CHEST  2 VIEW COMPARISON:  January 09, 2016 FINDINGS: The heart size and mediastinal contours are stable. Tracheostomy tube is unchanged. There is right pleural effusion with consolidation right lung base unchanged. There is mild interstitial edema. The visualized skeletal structures are stable. IMPRESSION: Persistent right pleural effusion with  consolidation right lung base unchanged. Mild interstitial edema. Electronically Signed   By: Abelardo Diesel M.D.   On: 01/19/2016 16:32   Dg Chest 2 View  Result Date: 01/09/2016 CLINICAL DATA:  Shortness of breath.  History of asthma and smoking. EXAM: CHEST  2 VIEW COMPARISON:  Portable chest x-ray of November 23, 2015 FINDINGS: The lungs are well-expanded. The interstitial markings of both lungs are increased. There is patchy increased density at the right lung base posteriorly. The heart and pulmonary vascularity are normal. The tracheostomy appliance tip projects between the clavicular heads. The pacemaker electrodes are in reasonable position radiographically. IMPRESSION: Mild hyperinflation consistent with known reactive airway disease. Increased lung markings at both lung bases may reflect atelectasis or early pneumonia. There is pleural thickening versus pleural fluid at the right lung base. No evidence of CHF. Electronically Signed   By: David  Martinique M.D.   On: 01/09/2016 17:31   Ct Angio Chest Pe W And/or Wo Contrast  Result Date: 01/19/2016 CLINICAL DATA:  Shortness of breath. EXAM: CT ANGIOGRAPHY CHEST WITH CONTRAST TECHNIQUE: Multidetector CT imaging of the chest was performed using the standard protocol during bolus administration of intravenous contrast. Multiplanar CT image reconstructions and MIPs were obtained to evaluate the vascular anatomy. CONTRAST:  100 mL of Isovue 370 intravenously. COMPARISON:  Radiographs of same day.  CT scan of November 01, 2015. FINDINGS: Cardiovascular: There is no evidence of thoracic aortic dissection or aneurysm. There is no definite evidence of pulmonary embolus. Mediastinum/Nodes: Tracheostomy tube is in grossly good position. Left-sided pacemaker is unchanged in position. 1.2 cm precarinal lymph node is noted. Thyroid gland is unremarkable. Lungs/Pleura: No pneumothorax is noted. Right lower lobe atelectasis or infiltrate is noted, which appears to be  due to bronchial occlusion or mucous plugging. Right upper lobe airspace opacity is noted concerning for pneumonia. Upper Abdomen: No acute abnormality. Musculoskeletal: No chest wall abnormality. No acute or significant osseous findings. Review of the MIP images confirms the above findings. IMPRESSION: No definite evidence of pulmonary embolus. Tracheostomy tube in grossly good position. 1.2 cm precarinal lymph node is noted which is enlarged compared to prior exam. It is uncertain if this is inflammatory or neoplastic in origin; clinical correlation is recommended. Right upper lobe airspace opacity is noted concerning for pneumonia. Right lower lobe atelectasis or infiltrate is noted, which appears to be due to occlusion of lower lobe bronchus, potentially due to mucous plugging. Bronchoscopy is recommended for further evaluation. Electronically Signed   By: Marijo Conception, M.D.  On: 01/19/2016 20:39   Dg Chest Port 1 View  Result Date: 01/21/2016 CLINICAL DATA:  Healthcare associated pneumonia. EXAM: PORTABLE CHEST 1 VIEW COMPARISON:  Chest x-rays dated 01/19/2016 and 01/09/2016 and 11/23/2015 and CT scan dated 01/19/2016 FINDINGS: There is persistent marked atelectasis of the right lower lobe with a tiny adjacent effusion. Heart size and pulmonary vascularity are normal. The patchy infiltrate in the right upper lobe seen on the CT scan is not appreciable on the current exam. Tracheostomy tube and pacemaker appear unchanged. IMPRESSION: Persistent atelectasis of the majority of the right lower lobe with a small associated pleural effusion. Right upper lobe infiltrate appears to have cleared. Electronically Signed   By: Lorriane Shire M.D.   On: 01/21/2016 11:01   Dg Swallowing Func-speech Pathology  Result Date: 01/22/2016 Objective Swallowing Evaluation: Type of Study: Bedside Swallow Evaluation Patient Details Name: Angela Page MRN: [id number] Date of Birth: March 11, 1970 Today's Date: 01/22/2016 Time: SLP  Start Time (ACUTE ONLY): 1046-SLP Stop Time (ACUTE ONLY): 1123 SLP Time Calculation (min) (ACUTE ONLY): 37 min Past Medical History: Past Medical History: Diagnosis Date . Asthma  . GERD (gastroesophageal reflux disease)  . Pacemaker  . Pleurisy  . Protein calorie malnutrition ([place]) 10/31/2015 . Quadriparesis ([place])  . Quadriplegia, C5-C7 incomplete ([place]) 10/08/2013 . Stage IV pressure ulcer of sacral region [facility]) 10/31/2015 Past Surgical History: Past Surgical History: Procedure Laterality Date . APPENDECTOMY   . BACK SURGERY  10/08/2013  fusion of spine, cervical region . CARDIAC SURGERY   . GASTROSTOMY  11/23/2015 . I&D EXTREMITY  10/28/2011  Procedure: IRRIGATION AND DEBRIDEMENT EXTREMITY;  Surgeon: Tennis Must, MD;  Location: [place];  Service: Orthopedics;  Laterality: Right;  Irrigation and debridement right middle finger . IR GENERIC HISTORICAL  11/23/2015  IR GASTROSTOMY TUBE MOD SED 11/23/2015 [facility]-INTERV RAD . 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  Presented with intermittent shortness of breath is worse in Am gets better when she uses oxygen.  Patient continues to chain smoke she has had numerous admissions in the past including 1 months ago. For pneumonia. She had a visit to emergency department on December 20 for shortness of breath with oxygen saturation down to 86% she was diagnosed with questionable pneumonia treated with clindamycin and albuterol when necessary and discharged to home. At home she's been using oxygen from her mom says she is herself does not have oxygen prescription. While in oxygen her oxygen saturation improved to 97%.  Patient known to ST service from previous admission with  previous MBS which found significant esopahgeal issues.  She currently has a trach and PEG in place and the patient reported that she recieves most of her nutrition via the PEG and just eats a little bit extra during the day.  Most recent chest xray is showing persistent right pleural effusion with consolidation right lung base unchanged. Subjective: pt seen in xray Assessment / Plan / Recommendation CHL IP CLINICAL IMPRESSIONS 01/22/2016 Therapy Diagnosis Mild oral phase dysphagia;Mild pharyngeal phase dysphagia;Suspected primary esophageal dysphagia Clinical Impression Mild oropharyngeal dysphagia with suspected primary esophageal deficits. NO aspiration or penetration of any consistency noted.  Pt did have decreased oral propulsion with premature spillage of boluses.  Pharyngeal swallow mildly weak with resultant vallecular residuals (worse  with thicker visocity than liquids) that she does not fully sense.  Following solid with liquids and intermittent dry swallow helpful.  In addition, cueing pt to "hock" helpful to clear secretions from vallecular space.  Appearance of decreased clearance *(worse with increased viscocity) with retrograde propulsion during MBS WITHOUT pt sensation, only able to cursory view proximal esophagus;  Radiologist not present to confirm.   Pt admits to doing well at home until eating an entire plate of spaghetti - rec continue full liquids with strict precautions.  Of note, pt was tested at approx 45* as that is how high she sits at home due to her decub - this may impact aspiration risk.     Impact on safety and function Moderate aspiration risk   CHL IP TREATMENT RECOMMENDATION 01/22/2016 Treatment Recommendations Therapy as outlined in treatment plan below   Prognosis 01/22/2016 Prognosis for Safe Diet Advancement Fair Barriers to Reach Goals Severity of deficits Barriers/Prognosis Comment -- CHL IP DIET RECOMMENDATION 01/22/2016 SLP Diet Recommendations Thin liquid Liquid Administration via  Cup;Straw Medication Administration Whole meds with liquid Compensations Slow rate;Small sips/bites;Follow solids with liquid;Other (Comment) Postural Changes Seated upright at 90 degrees;Remain semi-upright after after feeds/meals (Comment)   CHL IP OTHER RECOMMENDATIONS 01/22/2016 Recommended Consults -- Oral Care Recommendations Oral care BID Other Recommendations --   CHL IP FOLLOW UP RECOMMENDATIONS 01/22/2016 Follow up Recommendations (No Data)   CHL IP FREQUENCY AND DURATION 01/22/2016 Speech Therapy Frequency (ACUTE ONLY) min 1 x/week Treatment Duration 1 week      CHL IP ORAL PHASE 01/22/2016 Oral Phase Impaired Oral - Pudding Teaspoon -- Oral - Pudding Cup -- Oral - Honey Teaspoon -- Oral - Honey Cup -- Oral - Nectar Teaspoon -- Oral - Nectar Cup Premature spillage Oral - Nectar Straw Premature spillage Oral - Thin Teaspoon -- Oral - Thin Cup Premature spillage;Reduced posterior propulsion Oral - Thin Straw Premature spillage;Reduced posterior propulsion Oral - Puree Weak lingual manipulation;Lingual pumping;Reduced posterior propulsion;Piecemeal swallowing;Delayed oral transit Oral - Mech Soft Reduced posterior propulsion;Weak lingual manipulation;Delayed oral transit;Piecemeal swallowing Oral - Regular -- Oral - Multi-Consistency Reduced posterior propulsion Oral - Pill WFL Oral Phase - Comment --  CHL IP PHARYNGEAL PHASE 01/22/2016 Pharyngeal Phase Impaired Pharyngeal- Pudding Teaspoon -- Pharyngeal -- Pharyngeal- Pudding Cup -- Pharyngeal -- Pharyngeal- Honey Teaspoon -- Pharyngeal -- Pharyngeal- Honey Cup -- Pharyngeal -- Pharyngeal- Nectar Teaspoon -- Pharyngeal -- Pharyngeal- Nectar Cup Delayed swallow initiation-vallecula Pharyngeal -- Pharyngeal- Nectar Straw Delayed swallow initiation-vallecula Pharyngeal -- Pharyngeal- Thin Teaspoon -- Pharyngeal -- Pharyngeal- Thin Cup Delayed swallow initiation-vallecula Pharyngeal -- Pharyngeal- Thin Straw Delayed swallow initiation-vallecula Pharyngeal -- Pharyngeal-  Puree Reduced tongue base retraction;Pharyngeal residue - valleculae Pharyngeal -- Pharyngeal- Mechanical Soft Reduced tongue base retraction;Pharyngeal residue - valleculae Pharyngeal -- Pharyngeal- Regular -- Pharyngeal -- Pharyngeal- Multi-consistency WFL Pharyngeal -- Pharyngeal- Pill WFL Pharyngeal -- Pharyngeal Comment --  CHL IP CERVICAL ESOPHAGEAL PHASE 01/22/2016 Cervical Esophageal Phase Impaired Pudding Teaspoon -- Pudding Cup -- Honey Teaspoon -- Honey Cup -- Nectar Teaspoon -- Nectar Cup -- Nectar Straw -- Thin Teaspoon -- Thin Cup -- Thin Straw -- Puree -- Mechanical Soft -- Regular -- Multi-consistency -- Pill -- Cervical Esophageal Comment appearance of decreased clearance *(worse with increased viscocity) with retrograde propulsion during MBS WITHOUT pt sensation, only able to cursory view proximal esophagus;  radiologist not present to confirm No flowsheet data found. [name], [place] [facility] SLP [phone]               Microbiology:  Recent Results (from the past 240 hour(s))  Blood culture (routine x 2)     Status: Abnormal   Collection Time: 01/19/16  3:39 PM  Result Value Ref Range Status   Specimen Description BLOOD LEFT ANTECUBITAL  Final   Special Requests BOTTLES DRAWN AEROBIC AND ANAEROBIC 5CC EACH  Final   Culture  Setup Time   Final    GRAM POSITIVE COCCI IN CLUSTERS AEROBIC BOTTLE ONLY Organism ID to follow CRITICAL RESULT CALLED TO, READ BACK BY AND VERIFIED WITH: A. PHAM, PHARM AT 0841 ON [id number] BY Rhea Bleacher    Culture (A)  Final    STAPHYLOCOCCUS SPECIES (COAGULASE NEGATIVE) THE SIGNIFICANCE OF ISOLATING THIS ORGANISM FROM A SINGLE SET OF BLOOD CULTURES WHEN MULTIPLE SETS ARE DRAWN IS UNCERTAIN. PLEASE NOTIFY THE MICROBIOLOGY DEPARTMENT WITHIN ONE WEEK IF SPECIATION AND SENSITIVITIES ARE REQUIRED. Performed at [facility]    Report Status 01/23/2016 FINAL  Final  Blood Culture ID Panel (Reflexed)     Status: Abnormal   Collection Time: 01/19/16  3:39 PM   Result Value Ref Range Status   Enterococcus species NOT DETECTED NOT DETECTED Final   Listeria monocytogenes NOT DETECTED NOT DETECTED Final   Staphylococcus species DETECTED (A) NOT DETECTED Final    Comment: CRITICAL RESULT CALLED TO, READ BACK BY AND VERIFIED WITH: A. PHAM, PHARM AT 0841 ON [id number] BY S. YARBROUGH    Staphylococcus aureus NOT DETECTED NOT DETECTED Final   Methicillin resistance DETECTED (A) NOT DETECTED Final    Comment: CRITICAL RESULT CALLED TO, READ BACK BY AND VERIFIED WITH: A. PHAM, PHARM AT 0841 ON [id number] BY S. YARBROUGH    Streptococcus species NOT DETECTED NOT DETECTED Final   Streptococcus agalactiae NOT DETECTED NOT DETECTED Final   Streptococcus pneumoniae NOT DETECTED NOT DETECTED Final   Streptococcus pyogenes NOT DETECTED NOT DETECTED Final   Acinetobacter baumannii NOT DETECTED NOT DETECTED Final   Enterobacteriaceae species NOT DETECTED NOT DETECTED Final   Enterobacter cloacae complex NOT DETECTED NOT DETECTED Final   Escherichia coli NOT DETECTED NOT DETECTED Final   Klebsiella oxytoca NOT DETECTED NOT DETECTED Final   Klebsiella pneumoniae NOT DETECTED NOT DETECTED Final   Proteus species NOT DETECTED NOT DETECTED Final   Serratia marcescens NOT DETECTED NOT DETECTED Final   Haemophilus influenzae NOT DETECTED NOT DETECTED Final   Neisseria meningitidis NOT DETECTED NOT DETECTED Final   Pseudomonas aeruginosa NOT DETECTED NOT DETECTED Final   Candida albicans NOT DETECTED NOT DETECTED Final   Candida glabrata NOT DETECTED NOT DETECTED Final   Candida krusei NOT DETECTED NOT DETECTED Final   Candida parapsilosis NOT DETECTED NOT DETECTED Final   Candida tropicalis NOT DETECTED NOT DETECTED Final    Comment: Performed at [facility]  Culture, respiratory (NON-Expectorated)     Status: None   Collection Time: 01/20/16 12:44 AM  Result Value Ref Range Status   Specimen Description TRACHEAL SITE  Final   Special Requests NONE  Final    Gram Stain   Final    RARE WBC PRESENT, PREDOMINANTLY PMN RARE SQUAMOUS EPITHELIAL CELLS PRESENT FEW GRAM NEGATIVE COCCI RARE GRAM POSITIVE RODS RARE GRAM POSITIVE COCCI IN PAIRS    Culture   Final    ABUNDANT ACINETOBACTER CALCOACETICUS/BAUMANNII COMPLEX MULTI-DRUG RESISTANT ORGANISM RESULT CALLED TO, READ BACK BY AND VERIFIED WITH: DR Mavric Cortright 01/23/16 @ 2 M VESTAL Performed at [facility]    Report Status 01/23/2016 FINAL  Final   Organism ID, Bacteria ACINETOBACTER CALCOACETICUS/BAUMANNII  COMPLEX  Final      Susceptibility   Acinetobacter calcoaceticus/baumannii complex - MIC*    CEFTAZIDIME >=64 RESISTANT Resistant     CEFTRIAXONE >=64 RESISTANT Resistant     CIPROFLOXACIN >=4 RESISTANT Resistant     GENTAMICIN 4 SENSITIVE Sensitive     IMIPENEM 1 SENSITIVE Sensitive     PIP/TAZO >=128 RESISTANT Resistant     TRIMETH/SULFA >=320 RESISTANT Resistant     CEFEPIME >=64 RESISTANT Resistant     AMPICILLIN/SULBACTAM >=32 RESISTANT Resistant     * ABUNDANT ACINETOBACTER CALCOACETICUS/BAUMANNII COMPLEX  Blood culture (routine x 2)     Status: None (Preliminary result)   Collection Time: 01/20/16  1:08 AM  Result Value Ref Range Status   Specimen Description BLOOD LEFT HAND  Final   Special Requests BOTTLES DRAWN AEROBIC ONLY  5CC  Final   Culture   Final    NO GROWTH 2 DAYS Performed at [facility]    Report Status PENDING  Incomplete     Labs: Basic Metabolic Panel:  Recent Labs Lab 01/19/16 1535 01/20/16 0108 01/21/16 0429  NA 133* 135 138  K 4.5 4.1 4.2  CL 95* 98* 102  CO2 28 28 28   GLUCOSE 110* 90 150*  BUN 26* 23* 16  CREATININE 0.35* 0.33* 0.32*  CALCIUM 9.6 9.8 9.5   Liver Function Tests:  Recent Labs Lab 01/19/16 1535 01/20/16 0108 01/21/16 0429  AST 81* 63* 47*  ALT 85* 77* 57*  ALKPHOS 205* 176* 147*  BILITOT 0.4 0.5 0.4  PROT 7.6 7.1 6.7  ALBUMIN 3.9 3.5 3.3*   No results for input(s): LIPASE, AMYLASE in the last 168  hours. No results for input(s): AMMONIA in the last 168 hours. CBC:  Recent Labs Lab 01/19/16 1535 01/20/16 0108 01/21/16 0429  WBC 7.4 4.8 4.5  NEUTROABS 5.5 3.0  --   HGB 13.3 11.7* 11.5*  HCT 40.5 37.1 35.3*  MCV 94.2 92.5 94.1  PLT 243 215 233   Cardiac Enzymes: No results for input(s): CKTOTAL, CKMB, CKMBINDEX, TROPONINI in the last 168 hours. BNP: BNP (last 3 results)  Recent Labs  01/09/16 1612 01/19/16 1535  BNP 30.4 27.4        Signed:  Graceann Boileau S MD.  [facility] 01/23/2016, 1:42 PM

## 2016-01-23 NOTE — Care Management Note (Signed)
Case Management Note  Patient Details  Name: Zelpha Paty MRN: [id number] Date of Birth: November 10, 1970  Subjective/Objective: CM again made contact with [facility] to ensure continuity of care. Pt has been active with [facility] for [facility] and will resume services at discharge. MD has ordered Oxygen at home for this pt and CM notified [facility] DME services ut due to pts Quad status, unable to qualify her by ambulating on and off O2 at this time. (< 89%)  Pt reports she uses her Mothers Oxygen prn. Advised against this and advised to document her O2 saturations when she feels she needs to use her Mothers Oxygen in the future to assist with qualification process. Pt verbalized agreement.                    Action/Plan: Anticipate discharge home today. No further CM needs but will be available should additional discharge needs arise.   Expected Discharge Date:                  Expected Discharge Plan:  [place]  In-House Referral:  NA  Discharge planning Services  CM Consult  Post Acute Care Choice:  Home Health Choice offered to:  Patient  DME Arranged:  Oxygen DME Agency:  [place] Arranged:  Disease Management, RN [place] Agency:  [place]  Status of Service:  Completed, signed off  If discussed at Long Length of Stay Meetings, dates discussed:    Additional Comments:  Delrae Sawyers, RN 01/23/2016, 2:10 PM

## 2016-01-23 NOTE — Progress Notes (Signed)
Nutrition Follow-up  DOCUMENTATION CODES:   Underweight  INTERVENTION:  Recommend continue Osmolite 1.5 @ 90 ml/hr over 16 hours (2300-1500).  Continue free water of 100 ml every 8 hours (300 ml) This provides 2160 kcal (110% of needs), 90g protein (100% of needs) and 1397 ml H2O.   NUTRITION DIAGNOSIS:   Inadequate oral intake related to dysphagia as evidenced by other (see comment) (full liquid diet and need for tube feeds).  Ongoing.  GOAL:   Patient will meet greater than or equal to 90% of their needs  Met.  MONITOR:   Labs, Weight trends, TF tolerance, I & O's, Skin  REASON FOR ASSESSMENT:   Consult Assessment of nutrition requirement/status  ASSESSMENT:   46 y.o. 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  Spoke with patient at bedside. She reports she is still tolerating TF regimen well with no concerns. She is sipping on Ensure and soda, as well. Denies N/V or abdominal pain.   Current Regimen: Osmolite 1.5 @ 90 ml/hr over 16 hours (2300-1500) with free water flushes of 100 ml Q8hrs. Provides 2160 kcal, 90 grams of protein, 1397 ml H2O daily.  Medications reviewed and include: baclofen, NS @ 75 ml/hr.   No subsequent labs to review from initial assessment.   Discussed with RN. Patient tolerating current regimen. Plan is for patient to d/c today.  Diet Order:  Diet full liquid Room service appropriate? Yes; Fluid consistency: Thin Diet - low sodium heart healthy  Skin:  Wound (see comment) (Stg III pressure injuries on buttocks, Stg IV sacral ulcer)  Last BM:  1/1  Height:   Ht Readings from Last 1 Encounters:  01/20/16 '5\' 5"'$  (1.651 m)    Weight:   Wt Readings from  Last 1 Encounters:  01/20/16 95 lb 0.3 oz (43.1 kg)    Ideal Body Weight:  56.8 kg  BMI:  Body mass index is 15.81 kg/m.  Estimated Nutritional Needs:   Kcal:  9144-4584  Protein:  85-95g  Fluid:  1.8-2L/day  EDUCATION NEEDS:   No education needs identified at this time  Willey Blade, MS, RD, LDN Pager: [phone] After Hours Pager: [phone]

## 2016-01-23 NOTE — Progress Notes (Signed)
Pt requested not to do CPT at this time.

## 2016-01-25 LAB — CULTURE, BLOOD (ROUTINE X 2): Culture: NO GROWTH

## 2016-02-13 ENCOUNTER — Ambulatory Visit (HOSPITAL_COMMUNITY): Payer: Medicaid Other

## 2016-02-20 ENCOUNTER — Other Ambulatory Visit: Payer: Self-pay | Admitting: Acute Care

## 2016-02-20 ENCOUNTER — Ambulatory Visit (HOSPITAL_COMMUNITY)
Admission: RE | Admit: 2016-02-20 | Discharge: 2016-02-20 | Disposition: A | Discharge status: Home / Self Care | Payer: Medicaid Other | Source: Ambulatory Visit | Attending: Acute Care | Admitting: Acute Care

## 2016-02-20 DIAGNOSIS — Z930 Tracheostomy status: Secondary | ICD-10-CM | POA: Insufficient documentation

## 2016-02-20 DIAGNOSIS — J449 Chronic obstructive pulmonary disease, unspecified: Secondary | ICD-10-CM | POA: Diagnosis not present

## 2016-02-20 MED ORDER — TIOTROPIUM BROMIDE MONOHYDRATE 18 MCG IN CAPS: 18.0000 ug | ORAL_CAPSULE | Freq: Every day | RESPIRATORY_TRACT | 12 refills | Status: DC

## 2016-02-20 NOTE — Progress Notes (Signed)
   Subjective:  [name]  Patient ID: Angela Page, female    DOB: 1970-10-17, 46 y.o.   MRN: [id number]  HPI [name] returns today for routine trach care. The last time I saw her was 11/29 at which point she was doing very well. She has since been hospitalized once back in the end of December for a PNA and possible volume overload. She has since needed oxygen at home and for a brief time had to go with her trach open and using ATC instead of the red occlusion cap. She is doing relatively well otherwise and comes accompanied by her mother.   Review of Systems  Constitutional: Negative.   HENT: Negative.   Eyes: Negative.   Respiratory: Positive for cough and wheezing.        Needs suctioning about twice a day Cough mostly non-productive currently   Cardiovascular: Negative for chest pain, palpitations and leg swelling.  Gastrointestinal: Negative.   Endocrine: Negative.   Genitourinary: Negative.   Musculoskeletal: Negative.   Neurological: Negative.   Psychiatric/Behavioral: Negative.     HR 79 Spo2 100% prior to trach change on 3L [place] Objective:   Physical Exam  Constitutional: She is oriented to person, place, and time. She appears well-nourished. No distress.  HENT:  Head: Normocephalic.  Mouth/Throat: Oropharynx is clear and moist. No oropharyngeal exudate.  Eyes: Conjunctivae and EOM are normal. Pupils are equal, round, and reactive to light.  Neck: Normal range of motion. Neck supple.  # 6 trach is capped w/ red occlusion cap Stoma site is clean and dry   Cardiovascular: Normal rate, regular rhythm and normal heart sounds.  Exam reveals no friction rub.   No murmur heard. Pulmonary/Chest: Effort normal and breath sounds normal. No respiratory distress. She has no wheezes. She has no rales.  Abdominal: Soft. Bowel sounds are normal.  Musculoskeletal: She exhibits no edema or deformity.  Neurological: She is alert and oriented to person, place, and time.  Skin: Skin is warm and  dry. She is not diaphoretic.  Psychiatric: She has a normal mood and affect. Thought content normal.   Trach change   Trach Brand: Shiley Size: 6.0 Style: Uncuffed Secured by: Velcro    Assessment & Plan:   Tracheostomy dependence  Poor cough mechanics Quadriplegia  Possible COPD Dysphagia   Discussion  We had a long discussion about the trach and current issues. I think that given Kim's poor cough mechanics decannulation would be risky at this point; especially as she has required PRN suction up to twice a day. She is asking about possible Spiriva as she thinks she may have COPD given her smoking history. Ideally I would like to get PFTs but I doubt w/ her quadriplegia she could do this.   Plan [name] 3 months for routine trach change No plan for decannulation (she agrees) Will try spiriva (seems like little down side; if doesn't help won't continue).   Erick Colace ACNP-BC [place] Pager # [phone] OR # [phone] if no answer

## 2016-02-20 NOTE — Progress Notes (Signed)
Tracheostomy Procedure Note  Angela Page [id number] 1970-06-01  Pre Procedure Tracheostomy Information  Trach Brand: Shiley Size: 6.0 Style: Uncuffed Secured by: Velcro   Procedure: HR 79 Spo2 100% prior to trach change on 3L [name]    Post Procedure Tracheostomy Information  Trach Brand: Shiley Size: 6.0 Style: Uncuffed Secured by: Velcro   Post Procedure Evaluation:  ETCO2 positive color change from yellow to purple : Yes.   Vital signs pulse 79, respirations 12 and pulse oximetry 100 % Patients current condition: stable Complications: No apparent complications Trach site exam: clean Wound care done: dry Patient did tolerate procedure well.   Education:  Prescription needs:  Pt requested to try [name]   Additional needs: Follow up in 12 weeks

## 2016-03-20 ENCOUNTER — Ambulatory Visit (HOSPITAL_COMMUNITY): Payer: Medicaid Other

## 2016-03-24 ENCOUNTER — Encounter (HOSPITAL_BASED_OUTPATIENT_CLINIC_OR_DEPARTMENT_OTHER): Payer: Medicare Other | Attending: Internal Medicine

## 2016-03-24 DIAGNOSIS — L89323 Pressure ulcer of left buttock, stage 3: Secondary | ICD-10-CM | POA: Insufficient documentation

## 2016-03-24 DIAGNOSIS — L89154 Pressure ulcer of sacral region, stage 4: Secondary | ICD-10-CM | POA: Insufficient documentation

## 2016-03-24 DIAGNOSIS — L89313 Pressure ulcer of right buttock, stage 3: Secondary | ICD-10-CM | POA: Diagnosis not present

## 2016-03-24 DIAGNOSIS — Z930 Tracheostomy status: Secondary | ICD-10-CM | POA: Insufficient documentation

## 2016-03-24 DIAGNOSIS — G8253 Quadriplegia, C5-C7 complete: Secondary | ICD-10-CM | POA: Diagnosis not present

## 2016-04-07 DIAGNOSIS — L89154 Pressure ulcer of sacral region, stage 4: Secondary | ICD-10-CM | POA: Diagnosis not present

## 2016-04-28 ENCOUNTER — Encounter (HOSPITAL_BASED_OUTPATIENT_CLINIC_OR_DEPARTMENT_OTHER): Payer: Medicare Other | Attending: Internal Medicine

## 2016-04-28 DIAGNOSIS — L89322 Pressure ulcer of left buttock, stage 2: Secondary | ICD-10-CM | POA: Insufficient documentation

## 2016-04-28 DIAGNOSIS — G8253 Quadriplegia, C5-C7 complete: Secondary | ICD-10-CM | POA: Insufficient documentation

## 2016-04-28 DIAGNOSIS — L89152 Pressure ulcer of sacral region, stage 2: Secondary | ICD-10-CM | POA: Insufficient documentation

## 2016-05-05 DIAGNOSIS — L89322 Pressure ulcer of left buttock, stage 2: Secondary | ICD-10-CM | POA: Diagnosis not present

## 2016-05-05 DIAGNOSIS — G8253 Quadriplegia, C5-C7 complete: Secondary | ICD-10-CM | POA: Diagnosis not present

## 2016-05-05 DIAGNOSIS — L89152 Pressure ulcer of sacral region, stage 2: Secondary | ICD-10-CM | POA: Diagnosis present

## 2016-05-07 ENCOUNTER — Ambulatory Visit (HOSPITAL_COMMUNITY)
Admission: RE | Admit: 2016-05-07 | Discharge: 2016-05-07 | Disposition: A | Discharge status: Home / Self Care | Payer: Medicare Other | Source: Ambulatory Visit | Attending: Acute Care | Admitting: Acute Care

## 2016-05-07 DIAGNOSIS — Z930 Tracheostomy status: Secondary | ICD-10-CM | POA: Diagnosis present

## 2016-05-07 NOTE — Progress Notes (Signed)
   Subjective:  [name]   Patient ID: Angela Page, female    DOB: [birth year], 46 y.o.   MRN: [id number]  HPI 46 year old quadriplegic female. Trach dependent since prolonged critical illness which eventually required trach in effort to get her off vent. She has since been at home. She has home health services. We had been entertaining the question of decannulation on previous visits but Angela Page had a bout of pneumonia and subsequently required supplemental oxygen. We decided at that point that going forward with decannulation w/ weak cough might be a bad idea.    Review of Systems  Constitutional: Negative.   HENT: Negative.   Eyes: Negative.   Respiratory: Negative.   Cardiovascular: Negative.   Gastrointestinal: Negative.   Endocrine: Negative.   Genitourinary: Negative.   Musculoskeletal: Negative.   Allergic/Immunologic: Negative.   Neurological: Negative.   Hematological: Negative.   Psychiatric/Behavioral: Negative.       VS- BP-130/89, HR-80, RR-18, SpO2-100% on 2L O2 with trach capped Objective:   Physical Exam  Constitutional: She is oriented to person, place, and time. She appears well-developed and well-nourished. No distress.  HENT:  Head: Normocephalic and atraumatic.  Mouth/Throat: Oropharynx is clear and moist.  Eyes: Conjunctivae and EOM are normal. Pupils are equal, round, and reactive to light.  Neck:  Trach stoma is clean and dry.  Excellent phonation w/ capped trach   Cardiovascular: Regular rhythm and normal heart sounds.  Exam reveals no friction rub.   No murmur heard. Pulmonary/Chest: Effort normal and breath sounds normal. No respiratory distress. She has no wheezes.  Abdominal: Soft. Bowel sounds are normal.  Musculoskeletal: Normal range of motion. She exhibits no edema.  Neurological: She is alert and oriented to person, place, and time.  Skin: Skin is warm and dry. She is not diaphoretic. No erythema.  Psychiatric: She has a normal mood and affect.    Trach BrandCristy Hilts Size: 6 Style: Uncuffed Secured by: Velcro    Assessment & Plan:  Trach dependent  Poor cough mechanics d/t  quadriplegia  COPD  Discussion Angela Page is doing well. We discussed at length risk/benefit of decannulation. She has not required vent, requires suction minimally at most. But none the less she is doing better w/ trach than she was without.. At this point we will continue to keep the trach indefinitely   Plan [name] 3 months  Capped trach as tolerated   Erick Colace ACNP-BC [place] Pager # [phone] OR # [phone] if no answer

## 2016-05-07 NOTE — Progress Notes (Signed)
Tracheostomy Procedure Note  Hyun Reali [id number] Jun 08, 1970  Pre Procedure Tracheostomy Information  Trach Brand: Shiley Size: 6 Style: Uncuffed Secured by: Velcro  VS- BP-130/89, HR-80, RR-18, SpO2-100% on 2L O2 with trach capped  Procedure: trach change    Post Procedure Tracheostomy Information  Trach Brand: Shiley Size: 6 Style: Uncuffed Secured by: Velcro   Post Procedure Evaluation:  ETCO2 positive color change from yellow to purple : Yes.   Vital signs:blood pressure 140/71, pulse 80, respirations 18 and pulse oximetry 100% on 2L with trach capped Patients current condition: stable Complications: No apparent complications Trach site exam: clean, dry Wound care done: dry Patient did tolerate procedure well.   Education: None  See back in trach clinic in 12 weeks

## 2016-05-28 ENCOUNTER — Emergency Department (HOSPITAL_COMMUNITY): Payer: Medicare Other

## 2016-05-28 ENCOUNTER — Emergency Department (HOSPITAL_COMMUNITY)
Admission: EM | Admit: 2016-05-28 | Discharge: 2016-05-28 | Disposition: A | Discharge status: Home / Self Care | Payer: Medicare Other | Attending: Physician Assistant | Admitting: Physician Assistant

## 2016-05-28 ENCOUNTER — Encounter (HOSPITAL_COMMUNITY): Payer: Self-pay | Admitting: *Deleted

## 2016-05-28 DIAGNOSIS — Z79899 Other long term (current) drug therapy: Secondary | ICD-10-CM | POA: Diagnosis not present

## 2016-05-28 DIAGNOSIS — I5032 Chronic diastolic (congestive) heart failure: Secondary | ICD-10-CM | POA: Diagnosis not present

## 2016-05-28 DIAGNOSIS — R600 Localized edema: Secondary | ICD-10-CM | POA: Insufficient documentation

## 2016-05-28 DIAGNOSIS — Z87891 Personal history of nicotine dependence: Secondary | ICD-10-CM | POA: Insufficient documentation

## 2016-05-28 DIAGNOSIS — J449 Chronic obstructive pulmonary disease, unspecified: Secondary | ICD-10-CM | POA: Insufficient documentation

## 2016-05-28 DIAGNOSIS — Z950 Presence of cardiac pacemaker: Secondary | ICD-10-CM | POA: Diagnosis not present

## 2016-05-28 DIAGNOSIS — M7989 Other specified soft tissue disorders: Secondary | ICD-10-CM | POA: Diagnosis present

## 2016-05-28 DIAGNOSIS — R609 Edema, unspecified: Secondary | ICD-10-CM

## 2016-05-28 LAB — URINALYSIS, ROUTINE W REFLEX MICROSCOPIC
BILIRUBIN URINE: NEGATIVE
Glucose, UA: NEGATIVE mg/dL
KETONES UR: NEGATIVE mg/dL
Nitrite: POSITIVE — AB
PH: 6 (ref 5.0–8.0)
PROTEIN: NEGATIVE mg/dL
SQUAMOUS EPITHELIAL / LPF: NONE SEEN
Specific Gravity, Urine: 1.006 (ref 1.005–1.030)

## 2016-05-28 LAB — CBC WITH DIFFERENTIAL/PLATELET
BASOS ABS: 0 10*3/uL (ref 0.0–0.1)
Basophils Relative: 0 %
EOS ABS: 0.6 10*3/uL (ref 0.0–0.7)
EOS PCT: 5 %
HCT: 41 % (ref 36.0–46.0)
Hemoglobin: 13.6 g/dL (ref 12.0–15.0)
LYMPHS ABS: 2.4 10*3/uL (ref 0.7–4.0)
Lymphocytes Relative: 21 %
MCH: 32.4 pg (ref 26.0–34.0)
MCHC: 33.2 g/dL (ref 30.0–36.0)
MCV: 97.6 fL (ref 78.0–100.0)
MONO ABS: 0.6 10*3/uL (ref 0.1–1.0)
Monocytes Relative: 5 %
Neutro Abs: 7.9 10*3/uL — ABNORMAL HIGH (ref 1.7–7.7)
Neutrophils Relative %: 69 %
PLATELETS: 351 10*3/uL (ref 150–400)
RBC: 4.2 MIL/uL (ref 3.87–5.11)
RDW: 12.7 % (ref 11.5–15.5)
WBC: 11.5 10*3/uL — ABNORMAL HIGH (ref 4.0–10.5)

## 2016-05-28 LAB — POC URINE PREG, ED: PREG TEST UR: NEGATIVE

## 2016-05-28 LAB — BASIC METABOLIC PANEL
Anion gap: 10 (ref 5–15)
BUN: 27 mg/dL — AB (ref 6–20)
CALCIUM: 10.1 mg/dL (ref 8.9–10.3)
CO2: 35 mmol/L — ABNORMAL HIGH (ref 22–32)
Chloride: 91 mmol/L — ABNORMAL LOW (ref 101–111)
Creatinine, Ser: 0.36 mg/dL — ABNORMAL LOW (ref 0.44–1.00)
GFR calc Af Amer: >60 mL/min (ref >60)
GLUCOSE: 114 mg/dL — AB (ref 65–99)
POTASSIUM: 4.4 mmol/L (ref 3.5–5.1)
SODIUM: 136 mmol/L (ref 135–145)

## 2016-05-28 LAB — I-STAT TROPONIN, ED: Troponin i, poc: 0 ng/mL (ref 0.00–0.08)

## 2016-05-28 LAB — BRAIN NATRIURETIC PEPTIDE: B NATRIURETIC PEPTIDE 5: 36.3 pg/mL (ref 0.0–100.0)

## 2016-05-28 MED ORDER — FUROSEMIDE 20 MG PO TABS: 20.0000 mg | ORAL_TABLET | Freq: Every day | ORAL | 0 refills | Status: DC

## 2016-05-28 NOTE — ED Notes (Signed)
[facility] here to transport patient 

## 2016-05-28 NOTE — ED Triage Notes (Signed)
Pt from home where family and home health cares for her.  Pt is a quad, has trach and is on 3L O2 by [place]. Pt was diagnosed with "edema and and a smidge of heart failure" in December.  She is being brought in today by ems due to increasing edema, she has pitting edema to below the knees and she reports that she feels "fullness up to chest".  Her VSS for ems but pt reports a heaviness while breathing.  spo2 99%

## 2016-05-28 NOTE — ED Notes (Signed)
Attempt IV placement in left upper arm unsuccessful.  Was able to draw blood but could not advance the cath to place IV. Pt tolerated this well

## 2016-05-28 NOTE — ED Notes (Signed)
Notified [facility] for transportation back to her home and notified patients mother she was being discharged.

## 2016-05-28 NOTE — ED Provider Notes (Signed)
[facility] DEPT Provider Note   CSN: [id number] Arrival date & time: 05/28/16  1308     History   Chief Complaint Chief Complaint  Patient presents with  . Leg Swelling    HPI Arlen Legendre is a 46 y.o. female.  HPI   Patient's a 46 year old female with past medical history significant for quadriparesis , trached, home O2 dependent, chronic diastolic CHF protein calorie malnutrition presenting today with swelling. Patient usually has swelling to her knees however it's been extending up. Patient here with caregiver reports that the swelling is in her upper legs. Patient's caregiver office thinks it's in her face and belly. Patient's had no true shortness of breath. She is still on her normal home oxygen.    Past Medical History:  Diagnosis Date  . Asthma   . GERD (gastroesophageal reflux disease)   . Pacemaker   . Pleurisy   . Protein calorie malnutrition ([place]) 10/31/2015  . Quadriparesis ([place])   . Quadriplegia, C5-C7 incomplete ([place]) 10/08/2013  . Stage IV pressure ulcer of sacral region [facility]) 10/31/2015    Patient Active Problem List   Diagnosis Date Noted  . Tracheostomy dependence ([place])   . Chronic obstructive pulmonary disease ([place])   . Chronic diastolic CHF (congestive heart failure) ([place]) 01/23/2016  . Tracheostomy status ([place])   . Palliative care encounter   . Goals of care, counseling/discussion   . Esophageal dysphagia   . Shortness of breath   . Acute respiratory failure with hypoxemia ([place])   . Encounter for intubation   . Malnutrition of moderate degree 11/01/2015  . Acute encephalopathy 10/31/2015  . Tobacco abuse 10/31/2015  . Asthma 10/31/2015  . Pressure ulcer 02/26/2015  . HCAP (healthcare-associated pneumonia) 02/25/2015  . Lower urinary tract infectious disease 02/25/2015  . Hypotension 02/25/2015  . Anemia 02/25/2015  . Enterococcus UTI 02/03/2015  . Sepsis ([place]) 01/31/2015  . RLL pneumonia ([place]) 01/31/2015  . Altered mental status  10/29/2014  . Encephalopathy, toxic 10/29/2014  . Toxic metabolic encephalopathy 88/50/2774  . Urinary tract infection due to Proteus 10/24/2014  . Leukocytosis 10/23/2014  . Hypokalemia 10/23/2014  . Decubitus ulcer of sacral region, stage 4 ([place]) 10/23/2014  . Anemia, iron deficiency 10/23/2014  . Severe sepsis ([place])   . Septic shock ([place]) 10/22/2014  . Quadriplegia, C5-C7 incomplete ([place]) 10/22/2014  . Depression   . Protein-calorie malnutrition, severe ([place]) 09/05/2014  . Overdose of opiate or related narcotic 09/02/2014    Past Surgical History:  Procedure Laterality Date  . APPENDECTOMY    . BACK SURGERY  10/08/2013   fusion of spine, cervical region  . CARDIAC SURGERY    . GASTROSTOMY  11/23/2015  . I&D EXTREMITY  10/28/2011   Procedure: IRRIGATION AND DEBRIDEMENT EXTREMITY;  Surgeon: Tennis Must, MD;  Location: [facility];  Service: Orthopedics;  Laterality: Right;  Irrigation and debridement right middle finger  . IR GENERIC HISTORICAL  11/23/2015   IR GASTROSTOMY TUBE MOD SED 11/23/2015 [facility]-INTERV RAD  . TRACHEOSTOMY      OB History    No data available       Home Medications    Prior to Admission medications   Medication Sig Start Date End Date Taking? Authorizing Provider  acetaminophen (TYLENOL) 500 MG tablet Place 1 tablet (500 mg total) into feeding tube every 6 (six) hours as needed for moderate pain. 11/26/15  Yes Florencia Reasons, MD  albuterol (PROVENTIL HFA;VENTOLIN HFA) 108 (90 BASE) MCG/ACT inhaler Inhale 2 puffs into the  lungs every 6 (six) hours as needed. For shortness of breath    Yes [provider]  albuterol (PROVENTIL) (2.5 MG/3ML) 0.083% nebulizer solution Take 2.5 mg by nebulization every 6 (six) hours as needed for wheezing or shortness of breath.   Yes [provider]  Amino Acids-Protein Hydrolys (FEEDING SUPPLEMENT, PRO-STAT SUGAR FREE 64,) LIQD Place 30 mLs into feeding tube 2 (two) times daily. 11/26/15  Yes Florencia Reasons, MD  baclofen (LIORESAL) 20 MG tablet Take 20 mg by mouth 3 (three) times daily. 12/14/15  Yes [provider]  clonazePAM (KLONOPIN) 0.5 MG disintegrating tablet Place 1 tablet (0.5 mg total) into feeding tube at bedtime. 01/23/16  Yes Oswald Hillock, MD  collagenase (SANTYL) ointment Apply topically daily. Patient taking differently: Apply 1 application topically daily as needed (wound care).  11/26/15  Yes Florencia Reasons, MD  [employer] VITAMIN B12 1000 MCG tablet Place 1 tablet (1,000 mcg total) into feeding tube daily. 11/26/15  Yes Florencia Reasons, MD  DULoxetine (CYMBALTA) 30 MG capsule Take 30 mg by mouth 2 (two) times daily. 11/30/15  Yes [provider]  furosemide (LASIX) 20 MG tablet Take 1 tablet (20 mg total) by mouth every other day. 01/23/16  Yes Oswald Hillock, MD  gabapentin (NEURONTIN) 400 MG capsule Take 400 mg by mouth 4 (four) times daily. 10/16/15  Yes [provider]  ibuprofen (ADVIL,MOTRIN) 200 MG tablet Place 2 tablets (400 mg total) into feeding tube every 6 (six) hours as needed for moderate pain. 11/26/15  Yes Florencia Reasons, MD  lidocaine (LIDODERM) 5 % Place 1 patch onto the skin 2 (two) times daily as needed (pain).  04/04/15  Yes [provider]  midodrine (PROAMATINE) 10 MG tablet Place 1 tablet (10 mg total) into feeding tube 3 (three) times daily with meals. 11/26/15  Yes Florencia Reasons, MD  neomycin-bacitracin-polymyxin (NEOSPORIN) ointment Apply 1 application topically daily. apply to eye Patient taking differently: Apply 1 application topically daily as needed for wound care. apply to eye 11/26/15  Yes Florencia Reasons, MD  Nutritional Supplements (FEEDING SUPPLEMENT, OSMOLITE 1.5 CAL,) LIQD Place 1,000 mLs into feeding tube daily. 11/27/15  Yes Florencia Reasons, MD  oxyCODONE (OXY IR/ROXICODONE) 5 MG immediate release tablet Place 1 tablet (5 mg total) into feeding tube every 6 (six) hours as needed for moderate pain or severe pain. Patient taking differently: Place 10 mg into  feeding tube every 4 (four) hours as needed for moderate pain or severe pain.  01/23/16  Yes Darrick Meigs, Marge Duncans, MD  polyethylene glycol (MIRALAX / GLYCOLAX) packet Place 17 g into feeding tube daily. Patient taking differently: Place 17 g into feeding tube daily as needed for moderate constipation.  11/27/15  Yes Florencia Reasons, MD  ranitidine (ZANTAC) 150 MG/10ML syrup Place 10 mLs (150 mg total) into feeding tube every 12 (twelve) hours. Patient taking differently: Place 150 mg into feeding tube 2 (two) times daily as needed for heartburn.  11/26/15  Yes Florencia Reasons, MD  silver sulfADIAZINE (SILVADENE) 1 % cream Apply 1 application topically every other day as needed (for wound care).   Yes [provider]  temazepam (RESTORIL) 7.5 MG capsule Take 7.5 mg by mouth at bedtime. 05/21/16  Yes [provider]  tiotropium (SPIRIVA HANDIHALER) 18 MCG inhalation capsule Place 1 capsule (18 mcg total) into inhaler and inhale daily. 02/20/16  Yes Erick Colace, NP  tiZANidine (ZANAFLEX) 4 MG tablet Take 4 mg by mouth 3 (three) times daily.  11/30/15  Yes [provider]  Water For Irrigation, Sterile (FREE WATER) SOLN Place 100 mLs into feeding tube every 8 (eight) hours. 11/26/15  Yes Florencia Reasons, MD  zinc oxide (BALMEX) 11.3 % CREA cream Apply 1 application topically 3 (three) times daily as needed (for skin redness/irritation).   Yes [provider]    Family History Family History  Problem Relation Age of Onset  . Hypertension Maternal Uncle     Social History Social History  Substance Use Topics  . Smoking status: Former Smoker    Packs/day: 1.00    Years: 25.00    Types: Cigarettes  . Smokeless tobacco: Never Used  . Alcohol use Yes     Comment: 2 x weekly     Allergies   Erythromycin; Nitrofurantoin monohyd macro; and Penicillins   Review of Systems Review of Systems  Constitutional: Negative for activity change.  Respiratory: Negative for shortness of breath.     Cardiovascular: Negative for chest pain.  Gastrointestinal: Negative for abdominal pain.     Physical Exam Updated Vital Signs BP (!) 97/57   Pulse 78   Temp 98.4 F (36.9 C) (Oral)   Resp 15   LMP  (LMP Unknown)   SpO2 100%   Physical Exam  Constitutional: She is oriented to person, place, and time. She appears well-developed and well-nourished.  Quadriplegic.  HENT:  Head: Normocephalic and atraumatic.  Eyes: Right eye exhibits no discharge.  Cardiovascular: Normal rate, regular rhythm and normal heart sounds.   No murmur heard. Mild rhonchi few sleep.  Pulmonary/Chest: Effort normal and breath sounds normal. She has no wheezes. She has no rales.  Trach in place Mild rhonchi.  Abdominal: Soft. She exhibits no distension. There is no tenderness.  Patient reports objective swelling of the abdomen. However no pitting edema noted.  Musculoskeletal: She exhibits edema.  Patient has edema in bilateral lower extremities. This extends up to mid thigh.  Neurological: She is oriented to person, place, and time.  Skin: Skin is warm and dry. She is not diaphoretic.  Psychiatric: She has a normal mood and affect.  Nursing note and vitals reviewed.    ED Treatments / Results  Labs (all labs ordered are listed, but only abnormal results are displayed) Labs Reviewed  CBC WITH DIFFERENTIAL/PLATELET - Abnormal; Notable for the following:       Result Value   WBC 11.5 (*)    Neutro Abs 7.9 (*)    All other components within normal limits  BASIC METABOLIC PANEL - Abnormal; Notable for the following:    Chloride 91 (*)    CO2 35 (*)    Glucose, Bld 114 (*)    BUN 27 (*)    Creatinine, Ser 0.36 (*)    All other components within normal limits  URINALYSIS, ROUTINE W REFLEX MICROSCOPIC - Abnormal; Notable for the following:    Color, Urine RED (*)    APPearance CLOUDY (*)    Hgb urine dipstick SMALL (*)    Nitrite POSITIVE (*)    Leukocytes, UA LARGE (*)    Bacteria, UA MANY  (*)    All other components within normal limits  BRAIN NATRIURETIC PEPTIDE  I-STAT TROPOININ, ED  POC URINE PREG, ED    EKG  EKG Interpretation  Date/Time:  Wednesday May 28 2016 15:28:29 EDT Ventricular Rate:  80 PR Interval:    QRS Duration: 84 QT Interval:  416 QTC Calculation: 480 R Axis:   53 Text Interpretation:  Atrial-paced rhythm ST elevation, consider inferior injury No significant change since last tracing Confirmed by Zenovia Jarred [phone]) on 05/28/2016 3:30:37 PM       Radiology Dg Chest 2 View  Result Date: 05/28/2016 CLINICAL DATA:  Quadriplegic.  Edema and shortness of breath. EXAM: CHEST  2 VIEW COMPARISON:  Chest radiograph 01/21/2016 FINDINGS: Tracheostomy tube tip is at the level of the clavicular heads. Left chest wall AICD leads are in unchanged position. Cardiomediastinal contours are unchanged. There is an unchanged small right pleural effusion. No pneumothorax. No focal airspace consolidation. IMPRESSION: Unchanged small right pleural effusion without active cardiopulmonary process. Electronically Signed   By: Ulyses Jarred M.D.   On: 05/28/2016 15:07    Procedures Procedures (including critical care time)  Medications Ordered in ED Medications - No data to display   Initial Impression / Assessment and Plan / ED Course  I have reviewed the triage vital signs and the nursing notes.  Pertinent labs & imaging results that were available during my care of the patient were reviewed by me and considered in my medical decision making (see chart for details).     Well appearing 46 year old female with quadriplegia presenting today with swelling. Patient feels like her lower exremity swelling is worse. This occurred over the last several days to weeks. Patient on Lasix every other day. Here patient does have increasing edema in her skin appears tight over bilateral lower extremities. However her lungs sound not displaying rails and she is on her home  oxygen. We'll increase her Lasix to every day. Patient's BNP is flat. We will have her follow-up with her primary care physician and potentially cardiologist echo as an outpatient. It is possible that this edema is not due to cardiac origin but rather a multifactorial process of cardiac, and low albumin state given her protein deficiency G tube dependence.  Final Clinical Impressions(s) / ED Diagnoses   Final diagnoses:  None    New Prescriptions New Prescriptions   No medications on file     Macarthur Critchley, MD 05/28/16 1542

## 2016-05-28 NOTE — Discharge Instructions (Signed)
We are unsure what is causing her swelling. We don't think it's worsening heart failure given her normal labs. However it may be because of your low albumin state. We will U to take daily Lasix and then follow up with your primary care physician as soon as possible. We know you have an appointment in 2 weeks. Please call and get labs drawn prior to the visit.

## 2016-05-28 NOTE — ED Notes (Signed)
Bed: WA02 Expected date:  Expected time:  Means of arrival:  Comments: EMS-SOB 

## 2016-06-02 ENCOUNTER — Encounter (HOSPITAL_BASED_OUTPATIENT_CLINIC_OR_DEPARTMENT_OTHER): Payer: Medicare Other | Attending: Internal Medicine

## 2016-06-02 DIAGNOSIS — L89152 Pressure ulcer of sacral region, stage 2: Secondary | ICD-10-CM | POA: Diagnosis present

## 2016-06-02 DIAGNOSIS — G8253 Quadriplegia, C5-C7 complete: Secondary | ICD-10-CM | POA: Insufficient documentation

## 2016-06-02 DIAGNOSIS — L89322 Pressure ulcer of left buttock, stage 2: Secondary | ICD-10-CM | POA: Insufficient documentation

## 2016-06-09 ENCOUNTER — Inpatient Hospital Stay (HOSPITAL_COMMUNITY): Payer: Medicare Other

## 2016-06-09 ENCOUNTER — Emergency Department (HOSPITAL_COMMUNITY): Payer: Medicare Other

## 2016-06-09 ENCOUNTER — Encounter (HOSPITAL_COMMUNITY): Payer: Self-pay | Admitting: Neurology

## 2016-06-09 ENCOUNTER — Inpatient Hospital Stay (HOSPITAL_COMMUNITY)
Admission: EM | Admit: 2016-06-09 | Discharge: 2016-06-13 | DRG: 698 | Disposition: A | Discharge status: Home Health | Payer: Medicare Other | Attending: Family Medicine | Admitting: Family Medicine

## 2016-06-09 DIAGNOSIS — S14102S Unspecified injury at C2 level of cervical spinal cord, sequela: Secondary | ICD-10-CM | POA: Diagnosis not present

## 2016-06-09 DIAGNOSIS — N319 Neuromuscular dysfunction of bladder, unspecified: Secondary | ICD-10-CM | POA: Diagnosis present

## 2016-06-09 DIAGNOSIS — Z950 Presence of cardiac pacemaker: Secondary | ICD-10-CM | POA: Diagnosis present

## 2016-06-09 DIAGNOSIS — R740 Nonspecific elevation of levels of transaminase and lactic acid dehydrogenase [LDH]: Secondary | ICD-10-CM | POA: Diagnosis present

## 2016-06-09 DIAGNOSIS — R748 Abnormal levels of other serum enzymes: Secondary | ICD-10-CM

## 2016-06-09 DIAGNOSIS — T83511A Infection and inflammatory reaction due to indwelling urethral catheter, initial encounter: Secondary | ICD-10-CM | POA: Diagnosis present

## 2016-06-09 DIAGNOSIS — J440 Chronic obstructive pulmonary disease with acute lower respiratory infection: Secondary | ICD-10-CM | POA: Diagnosis present

## 2016-06-09 DIAGNOSIS — N926 Irregular menstruation, unspecified: Secondary | ICD-10-CM | POA: Diagnosis present

## 2016-06-09 DIAGNOSIS — Z9981 Dependence on supplemental oxygen: Secondary | ICD-10-CM | POA: Diagnosis not present

## 2016-06-09 DIAGNOSIS — J690 Pneumonitis due to inhalation of food and vomit: Secondary | ICD-10-CM | POA: Diagnosis present

## 2016-06-09 DIAGNOSIS — Z960 Presence of urogenital implants: Secondary | ICD-10-CM

## 2016-06-09 DIAGNOSIS — R769 Abnormal immunological finding in serum, unspecified: Secondary | ICD-10-CM | POA: Diagnosis present

## 2016-06-09 DIAGNOSIS — Z931 Gastrostomy status: Secondary | ICD-10-CM

## 2016-06-09 DIAGNOSIS — R609 Edema, unspecified: Secondary | ICD-10-CM

## 2016-06-09 DIAGNOSIS — F17210 Nicotine dependence, cigarettes, uncomplicated: Secondary | ICD-10-CM | POA: Diagnosis present

## 2016-06-09 DIAGNOSIS — D72829 Elevated white blood cell count, unspecified: Secondary | ICD-10-CM

## 2016-06-09 DIAGNOSIS — I9589 Other hypotension: Secondary | ICD-10-CM | POA: Diagnosis present

## 2016-06-09 DIAGNOSIS — R140 Abdominal distension (gaseous): Secondary | ICD-10-CM | POA: Diagnosis present

## 2016-06-09 DIAGNOSIS — B379 Candidiasis, unspecified: Secondary | ICD-10-CM | POA: Diagnosis present

## 2016-06-09 DIAGNOSIS — Y95 Nosocomial condition: Secondary | ICD-10-CM | POA: Diagnosis present

## 2016-06-09 DIAGNOSIS — G8250 Quadriplegia, unspecified: Secondary | ICD-10-CM | POA: Diagnosis present

## 2016-06-09 DIAGNOSIS — G629 Polyneuropathy, unspecified: Secondary | ICD-10-CM | POA: Diagnosis present

## 2016-06-09 DIAGNOSIS — R0600 Dyspnea, unspecified: Secondary | ICD-10-CM

## 2016-06-09 DIAGNOSIS — Z79899 Other long term (current) drug therapy: Secondary | ICD-10-CM

## 2016-06-09 DIAGNOSIS — I272 Pulmonary hypertension, unspecified: Secondary | ICD-10-CM | POA: Diagnosis present

## 2016-06-09 DIAGNOSIS — E873 Alkalosis: Secondary | ICD-10-CM | POA: Diagnosis present

## 2016-06-09 DIAGNOSIS — G909 Disorder of the autonomic nervous system, unspecified: Secondary | ICD-10-CM | POA: Diagnosis present

## 2016-06-09 DIAGNOSIS — S14107S Unspecified injury at C7 level of cervical spinal cord, sequela: Secondary | ICD-10-CM

## 2016-06-09 DIAGNOSIS — Z79891 Long term (current) use of opiate analgesic: Secondary | ICD-10-CM | POA: Diagnosis not present

## 2016-06-09 DIAGNOSIS — K219 Gastro-esophageal reflux disease without esophagitis: Secondary | ICD-10-CM | POA: Diagnosis present

## 2016-06-09 DIAGNOSIS — E43 Unspecified severe protein-calorie malnutrition: Secondary | ICD-10-CM | POA: Diagnosis present

## 2016-06-09 DIAGNOSIS — R1314 Dysphagia, pharyngoesophageal phase: Secondary | ICD-10-CM | POA: Diagnosis present

## 2016-06-09 DIAGNOSIS — Z930 Tracheostomy status: Secondary | ICD-10-CM

## 2016-06-09 DIAGNOSIS — R600 Localized edema: Secondary | ICD-10-CM

## 2016-06-09 DIAGNOSIS — G8254 Quadriplegia, C5-C7 incomplete: Secondary | ICD-10-CM | POA: Diagnosis present

## 2016-06-09 DIAGNOSIS — Z978 Presence of other specified devices: Secondary | ICD-10-CM

## 2016-06-09 DIAGNOSIS — Z981 Arthrodesis status: Secondary | ICD-10-CM

## 2016-06-09 DIAGNOSIS — A419 Sepsis, unspecified organism: Secondary | ICD-10-CM | POA: Diagnosis present

## 2016-06-09 DIAGNOSIS — K5900 Constipation, unspecified: Secondary | ICD-10-CM | POA: Diagnosis present

## 2016-06-09 DIAGNOSIS — J449 Chronic obstructive pulmonary disease, unspecified: Secondary | ICD-10-CM | POA: Diagnosis present

## 2016-06-09 DIAGNOSIS — J9611 Chronic respiratory failure with hypoxia: Secondary | ICD-10-CM | POA: Diagnosis present

## 2016-06-09 DIAGNOSIS — L89154 Pressure ulcer of sacral region, stage 4: Secondary | ICD-10-CM | POA: Diagnosis present

## 2016-06-09 DIAGNOSIS — Y732 Prosthetic and other implants, materials and accessory gastroenterology and urology devices associated with adverse incidents: Secondary | ICD-10-CM | POA: Diagnosis present

## 2016-06-09 DIAGNOSIS — S14104S Unspecified injury at C4 level of cervical spinal cord, sequela: Secondary | ICD-10-CM | POA: Diagnosis not present

## 2016-06-09 DIAGNOSIS — W19XXXS Unspecified fall, sequela: Secondary | ICD-10-CM | POA: Diagnosis present

## 2016-06-09 DIAGNOSIS — S14103S Unspecified injury at C3 level of cervical spinal cord, sequela: Secondary | ICD-10-CM | POA: Diagnosis not present

## 2016-06-09 DIAGNOSIS — Z880 Allergy status to penicillin: Secondary | ICD-10-CM

## 2016-06-09 DIAGNOSIS — R601 Generalized edema: Secondary | ICD-10-CM | POA: Diagnosis not present

## 2016-06-09 DIAGNOSIS — N390 Urinary tract infection, site not specified: Secondary | ICD-10-CM | POA: Diagnosis present

## 2016-06-09 DIAGNOSIS — M7989 Other specified soft tissue disorders: Secondary | ICD-10-CM | POA: Diagnosis present

## 2016-06-09 DIAGNOSIS — I959 Hypotension, unspecified: Secondary | ICD-10-CM | POA: Diagnosis present

## 2016-06-09 DIAGNOSIS — I5033 Acute on chronic diastolic (congestive) heart failure: Secondary | ICD-10-CM | POA: Diagnosis present

## 2016-06-09 DIAGNOSIS — G8929 Other chronic pain: Secondary | ICD-10-CM | POA: Diagnosis present

## 2016-06-09 DIAGNOSIS — F329 Major depressive disorder, single episode, unspecified: Secondary | ICD-10-CM | POA: Diagnosis present

## 2016-06-09 DIAGNOSIS — E8809 Other disorders of plasma-protein metabolism, not elsewhere classified: Secondary | ICD-10-CM | POA: Diagnosis present

## 2016-06-09 DIAGNOSIS — Y846 Urinary catheterization as the cause of abnormal reaction of the patient, or of later complication, without mention of misadventure at the time of the procedure: Secondary | ICD-10-CM | POA: Diagnosis present

## 2016-06-09 DIAGNOSIS — Z881 Allergy status to other antibiotic agents status: Secondary | ICD-10-CM

## 2016-06-09 DIAGNOSIS — J189 Pneumonia, unspecified organism: Secondary | ICD-10-CM | POA: Diagnosis present

## 2016-06-09 DIAGNOSIS — K130 Diseases of lips: Secondary | ICD-10-CM | POA: Diagnosis present

## 2016-06-09 DIAGNOSIS — Z955 Presence of coronary angioplasty implant and graft: Secondary | ICD-10-CM

## 2016-06-09 DIAGNOSIS — S14105S Unspecified injury at C5 level of cervical spinal cord, sequela: Secondary | ICD-10-CM

## 2016-06-09 DIAGNOSIS — R110 Nausea: Secondary | ICD-10-CM | POA: Diagnosis present

## 2016-06-09 DIAGNOSIS — R739 Hyperglycemia, unspecified: Secondary | ICD-10-CM | POA: Diagnosis present

## 2016-06-09 DIAGNOSIS — S14106S Unspecified injury at C6 level of cervical spinal cord, sequela: Secondary | ICD-10-CM

## 2016-06-09 DIAGNOSIS — R0602 Shortness of breath: Secondary | ICD-10-CM | POA: Diagnosis present

## 2016-06-09 HISTORY — DX: Pneumonia, unspecified organism: J18.9

## 2016-06-09 LAB — URINALYSIS, ROUTINE W REFLEX MICROSCOPIC
BILIRUBIN URINE: NEGATIVE
GLUCOSE, UA: NEGATIVE mg/dL
Ketones, ur: NEGATIVE mg/dL
Nitrite: NEGATIVE
PROTEIN: 30 mg/dL — AB
Specific Gravity, Urine: 1.005 (ref 1.005–1.030)
pH: 5 (ref 5.0–8.0)

## 2016-06-09 LAB — I-STAT CG4 LACTIC ACID, ED
Lactic Acid, Venous: 1 mmol/L (ref 0.5–1.9)
Lactic Acid, Venous: 1.1 mmol/L (ref 0.5–1.9)

## 2016-06-09 LAB — CBC WITH DIFFERENTIAL/PLATELET
Basophils Absolute: 0 10*3/uL (ref 0.0–0.1)
Basophils Relative: 0 %
EOS ABS: 0.2 10*3/uL (ref 0.0–0.7)
Eosinophils Relative: 1 %
HEMATOCRIT: 37.1 % (ref 36.0–46.0)
HEMOGLOBIN: 12 g/dL (ref 12.0–15.0)
LYMPHS ABS: 1.3 10*3/uL (ref 0.7–4.0)
LYMPHS PCT: 8 %
MCH: 31.4 pg (ref 26.0–34.0)
MCHC: 32.3 g/dL (ref 30.0–36.0)
MCV: 97.1 fL (ref 78.0–100.0)
Monocytes Absolute: 0.8 10*3/uL (ref 0.1–1.0)
Monocytes Relative: 5 %
NEUTROS ABS: 15 10*3/uL — AB (ref 1.7–7.7)
NEUTROS PCT: 86 %
Platelets: 249 10*3/uL (ref 150–400)
RBC: 3.82 MIL/uL — AB (ref 3.87–5.11)
RDW: 13.3 % (ref 11.5–15.5)
WBC: 17.4 10*3/uL — AB (ref 4.0–10.5)

## 2016-06-09 LAB — COMPREHENSIVE METABOLIC PANEL
ALT: 77 U/L — AB (ref 14–54)
ANION GAP: 11 (ref 5–15)
AST: 75 U/L — AB (ref 15–41)
Albumin: 3.2 g/dL — ABNORMAL LOW (ref 3.5–5.0)
Alkaline Phosphatase: 187 U/L — ABNORMAL HIGH (ref 38–126)
BUN: 23 mg/dL — ABNORMAL HIGH (ref 6–20)
CHLORIDE: 93 mmol/L — AB (ref 101–111)
CO2: 30 mmol/L (ref 22–32)
Calcium: 9.4 mg/dL (ref 8.9–10.3)
Creatinine, Ser: 0.48 mg/dL (ref 0.44–1.00)
Glucose, Bld: 138 mg/dL — ABNORMAL HIGH (ref 65–99)
POTASSIUM: 4 mmol/L (ref 3.5–5.1)
SODIUM: 134 mmol/L — AB (ref 135–145)
Total Bilirubin: 0.4 mg/dL (ref 0.3–1.2)
Total Protein: 7.3 g/dL (ref 6.5–8.1)

## 2016-06-09 LAB — BRAIN NATRIURETIC PEPTIDE: B Natriuretic Peptide: 150.8 pg/mL — ABNORMAL HIGH (ref 0.0–100.0)

## 2016-06-09 LAB — MRSA PCR SCREENING: MRSA BY PCR: NEGATIVE

## 2016-06-09 LAB — TROPONIN I
Troponin I: <0.03 ng/mL (ref <0.03)
Troponin I: <0.03 ng/mL (ref <0.03)

## 2016-06-09 LAB — TSH: TSH: 1.456 u[IU]/mL (ref 0.350–4.500)

## 2016-06-09 MED ORDER — PRO-STAT SUGAR FREE PO LIQD
30.0000 mL | Freq: Two times a day (BID) | ORAL | Status: DC
  Administered 2016-06-10 – 2016-06-11 (×3): 30 mL
  Filled 2016-06-09 (×7): qty 30

## 2016-06-09 MED ORDER — OXYCODONE HCL 5 MG PO TABS
10.0000 mg | ORAL_TABLET | Freq: Once | ORAL | Status: AC
  Administered 2016-06-09: 10 mg
  Filled 2016-06-09: qty 2

## 2016-06-09 MED ORDER — FREE WATER
100.0000 mL | Freq: Three times a day (TID) | Status: DC
  Administered 2016-06-09 – 2016-06-13 (×11): 100 mL

## 2016-06-09 MED ORDER — SODIUM CHLORIDE 0.9 % IV SOLN
2.0000 g | Freq: Once | INTRAVENOUS | Status: AC
  Administered 2016-06-09: 2 g via INTRAVENOUS
  Filled 2016-06-09: qty 2

## 2016-06-09 MED ORDER — TIOTROPIUM BROMIDE MONOHYDRATE 18 MCG IN CAPS
18.0000 ug | ORAL_CAPSULE | Freq: Every day | RESPIRATORY_TRACT | Status: DC
  Administered 2016-06-10 – 2016-06-12 (×3): 18 ug via RESPIRATORY_TRACT
  Filled 2016-06-09 (×2): qty 5

## 2016-06-09 MED ORDER — ONDANSETRON HCL 4 MG/2ML IJ SOLN: 4.0000 mg | Freq: Four times a day (QID) | INTRAMUSCULAR | Status: DC | PRN

## 2016-06-09 MED ORDER — VANCOMYCIN HCL IN DEXTROSE 1-5 GM/200ML-% IV SOLN
1000.0000 mg | Freq: Once | INTRAVENOUS | Status: AC
  Administered 2016-06-09: 1000 mg via INTRAVENOUS
  Filled 2016-06-09: qty 200

## 2016-06-09 MED ORDER — ENOXAPARIN SODIUM 40 MG/0.4ML ~~LOC~~ SOLN
40.0000 mg | SUBCUTANEOUS | Status: DC
  Administered 2016-06-09 – 2016-06-12 (×4): 40 mg via SUBCUTANEOUS
  Filled 2016-06-09 (×4): qty 0.4

## 2016-06-09 MED ORDER — POLYETHYLENE GLYCOL 3350 17 G PO PACK: 17.0000 g | PACK | Freq: Every day | ORAL | Status: DC | PRN

## 2016-06-09 MED ORDER — MIDODRINE HCL 5 MG PO TABS
10.0000 mg | ORAL_TABLET | Freq: Three times a day (TID) | ORAL | Status: DC
  Administered 2016-06-10 (×3): 10 mg
  Filled 2016-06-09 (×4): qty 2

## 2016-06-09 MED ORDER — IOPAMIDOL (ISOVUE-370) INJECTION 76%
INTRAVENOUS | Status: AC
  Administered 2016-06-09: 100 mL
  Filled 2016-06-09: qty 100

## 2016-06-09 MED ORDER — SODIUM CHLORIDE 0.9 % IV BOLUS (SEPSIS)
1000.0000 mL | Freq: Once | INTRAVENOUS | Status: AC
  Administered 2016-06-09: 1000 mL via INTRAVENOUS

## 2016-06-09 MED ORDER — ACETAMINOPHEN 500 MG PO TABS
500.0000 mg | ORAL_TABLET | Freq: Four times a day (QID) | ORAL | Status: DC | PRN
Start: 2016-06-09 — End: 2016-06-11

## 2016-06-09 MED ORDER — LIDOCAINE 5 % EX PTCH
1.0000 | MEDICATED_PATCH | Freq: Two times a day (BID) | CUTANEOUS | Status: DC | PRN
  Filled 2016-06-09: qty 1

## 2016-06-09 MED ORDER — ALBUTEROL SULFATE (2.5 MG/3ML) 0.083% IN NEBU: 2.5000 mg | INHALATION_SOLUTION | Freq: Four times a day (QID) | RESPIRATORY_TRACT | Status: DC | PRN

## 2016-06-09 MED ORDER — GABAPENTIN 400 MG PO CAPS
400.0000 mg | ORAL_CAPSULE | Freq: Four times a day (QID) | ORAL | Status: DC
  Administered 2016-06-09 – 2016-06-13 (×15): 400 mg via ORAL
  Filled 2016-06-09 (×15): qty 1

## 2016-06-09 MED ORDER — SODIUM CHLORIDE 0.9 % IV BOLUS (SEPSIS): 500.0000 mL | Freq: Once | INTRAVENOUS | Status: DC

## 2016-06-09 MED ORDER — MIDODRINE HCL 5 MG PO TABS
10.0000 mg | ORAL_TABLET | Freq: Once | ORAL | Status: AC
  Administered 2016-06-09: 10 mg via ORAL
  Filled 2016-06-09: qty 2

## 2016-06-09 MED ORDER — TEMAZEPAM 7.5 MG PO CAPS
7.5000 mg | ORAL_CAPSULE | Freq: Every day | ORAL | Status: DC
  Administered 2016-06-09: 7.5 mg via ORAL
  Filled 2016-06-09: qty 1

## 2016-06-09 MED ORDER — VANCOMYCIN HCL IN DEXTROSE 750-5 MG/150ML-% IV SOLN
750.0000 mg | Freq: Two times a day (BID) | INTRAVENOUS | Status: DC
  Administered 2016-06-10 – 2016-06-11 (×4): 750 mg via INTRAVENOUS
  Filled 2016-06-09 (×6): qty 150

## 2016-06-09 MED ORDER — CLONAZEPAM 0.5 MG PO TBDP: 0.5000 mg | ORAL_TABLET | Freq: Every day | ORAL | Status: DC

## 2016-06-09 MED ORDER — IOPAMIDOL (ISOVUE-370) INJECTION 76%
INTRAVENOUS | Status: AC
  Filled 2016-06-09: qty 100

## 2016-06-09 MED ORDER — OSMOLITE 1.5 CAL PO LIQD
1000.0000 mL | ORAL | Status: DC
  Administered 2016-06-09: 1000 mL
  Filled 2016-06-09 (×2): qty 1000

## 2016-06-09 MED ORDER — ONDANSETRON HCL 4 MG PO TABS: 4.0000 mg | ORAL_TABLET | Freq: Four times a day (QID) | ORAL | Status: DC | PRN

## 2016-06-09 MED ORDER — SODIUM CHLORIDE 0.9 % IV SOLN
2.0000 g | Freq: Two times a day (BID) | INTRAVENOUS | Status: DC
  Administered 2016-06-10 – 2016-06-12 (×4): 2 g via INTRAVENOUS
  Filled 2016-06-09 (×7): qty 2

## 2016-06-09 MED ORDER — SODIUM CHLORIDE 0.9% FLUSH
3.0000 mL | Freq: Two times a day (BID) | INTRAVENOUS | Status: DC
  Administered 2016-06-09 – 2016-06-13 (×7): 3 mL via INTRAVENOUS

## 2016-06-09 MED ORDER — COLLAGENASE 250 UNIT/GM EX OINT
1.0000 "application " | TOPICAL_OINTMENT | Freq: Every day | CUTANEOUS | Status: DC | PRN
  Filled 2016-06-09: qty 30

## 2016-06-09 MED ORDER — ACETAMINOPHEN 325 MG PO TABS
650.0000 mg | ORAL_TABLET | Freq: Once | ORAL | Status: AC
  Administered 2016-06-09: 650 mg via ORAL
  Filled 2016-06-09: qty 2

## 2016-06-09 MED ORDER — DEXTROSE 5 % IV SOLN
2.0000 g | Freq: Once | INTRAVENOUS | Status: DC
  Filled 2016-06-09: qty 2

## 2016-06-09 MED ORDER — TIZANIDINE HCL 4 MG PO TABS
4.0000 mg | ORAL_TABLET | Freq: Three times a day (TID) | ORAL | Status: DC
  Administered 2016-06-09 – 2016-06-13 (×11): 4 mg via ORAL
  Filled 2016-06-09 (×3): qty 1
  Filled 2016-06-09: qty 2
  Filled 2016-06-09: qty 1
  Filled 2016-06-09: qty 2
  Filled 2016-06-09 (×9): qty 1
  Filled 2016-06-09: qty 2

## 2016-06-09 MED ORDER — BACLOFEN 20 MG PO TABS
20.0000 mg | ORAL_TABLET | Freq: Three times a day (TID) | ORAL | Status: DC
  Administered 2016-06-09 – 2016-06-13 (×11): 20 mg via ORAL
  Filled 2016-06-09 (×2): qty 1
  Filled 2016-06-09: qty 2
  Filled 2016-06-09: qty 1
  Filled 2016-06-09: qty 2
  Filled 2016-06-09 (×2): qty 1
  Filled 2016-06-09: qty 2
  Filled 2016-06-09 (×5): qty 1

## 2016-06-09 MED ORDER — RANITIDINE HCL 150 MG/10ML PO SYRP
150.0000 mg | ORAL_SOLUTION | Freq: Two times a day (BID) | ORAL | Status: DC | PRN
  Filled 2016-06-09: qty 10

## 2016-06-09 MED ORDER — OXYCODONE HCL 5 MG PO TABS
10.0000 mg | ORAL_TABLET | ORAL | Status: DC | PRN
  Administered 2016-06-09 – 2016-06-11 (×7): 10 mg
  Filled 2016-06-09 (×7): qty 2

## 2016-06-09 MED ORDER — ALBUTEROL SULFATE HFA 108 (90 BASE) MCG/ACT IN AERS: 2.0000 | INHALATION_SPRAY | Freq: Four times a day (QID) | RESPIRATORY_TRACT | Status: DC | PRN

## 2016-06-09 MED ORDER — DULOXETINE HCL 30 MG PO CPEP
30.0000 mg | ORAL_CAPSULE | Freq: Two times a day (BID) | ORAL | Status: DC
  Administered 2016-06-09: 30 mg via ORAL
  Filled 2016-06-09 (×2): qty 1

## 2016-06-09 NOTE — Progress Notes (Signed)
Pharmacy Antibiotic Note Angela Page is a 46 y.o. female admitted on 06/09/2016 with lower extremity edema and SOB. Starting broad spectrum abx for sepsis. LA 1, WBC 17.4, SCr 0.36 > 0.48, eCrCl unreliable given bedbound status.    Plan: -Vancomycin 1 g IV x1 then 750 mg IV q24h -Merrem 2 g IV q12h -Monitor cultures, renal function, uop, therapeutic drug monitoring as needed    Temp (24hrs), Avg:100.4 F (38 C), Min:100.4 F (38 C), Max:100.4 F (38 C)  No results for input(s): WBC, CREATININE, LATICACIDVEN, VANCOTROUGH, VANCOPEAK, VANCORANDOM, GENTTROUGH, GENTPEAK, GENTRANDOM, TOBRATROUGH, TOBRAPEAK, TOBRARND, AMIKACINPEAK, AMIKACINTROU, AMIKACIN in the last 168 hours.  CrCl cannot be calculated (Unknown ideal weight.).    Allergies  Allergen Reactions  . Erythromycin Nausea And Vomiting  . Nitrofurantoin Monohyd Macro Nausea And Vomiting  . Penicillins Hives    Has patient had a PCN reaction causing immediate rash, facial/tongue/throat swelling, SOB or lightheadedness with hypotension: Yes Has patient had a PCN reaction causing severe rash involving mucus membranes or skin necrosis: Yes Has patient had a PCN reaction that required hospitalization Yes- went to the DR Has patient had a PCN reaction occurring within the last 10 years: No-childhood allergy If all of the above answers are "NO", then may proceed with Cephalosporin use.     Antimicrobials this admission: 5/21 vancomycin > 5/21 merrem >   Dose adjustments this admission: N/A   Microbiology results: 5/21 blood cx: 5/21 urine cx:    Harvel Quale 06/09/2016 1:54 PM

## 2016-06-09 NOTE — H&P (Signed)
[facility] Admission History and Physical Service Pager: [phone]  Patient name: Angela Page Medical record number: [id number] Date of birth: 08/17/1970 Age: 46 y.o. Gender: female  Primary Care Provider: System, Provider Not In Consultants: None  Code Status: FULL   Chief Complaint: shortness of breath, leg swelling   Assessment and Plan: Noami Page is a 46 y.o. female presenting with shortness of breath and LE swelling. 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 Patient with a hx of UTIs and urine with nitrites/LE two weeks ago, though from indwelling catheter. Repeat UA in the ED without nitrites. Does endorse worsening cough with CXR noting vascular congestion, small right effusion, possibly the start of small right consolidation.  Noted to 2 sacral decubitus ulcers - stage 3, no signs of infection on examination. Patient noted to have low blood pressures  - per home aid SBP 85-100, DBP 50s, therefore lower blood pressure less concern for sepsis, LA 1.0. SOB with SIRS most concerning for PNA, less likely due to UTI  - Admit to step-down, trach unit, attending Dr. McDiarmid  - VSS, continue pulse oximetry  - Blood and urine cultures pending   - Continue vancomycin and meropenem (PCN allergy) - obtain Stre pneumonia and Legionella antigen  - Obtain trachea aspirate - culture and gram stain - hx of acintobacter positive in the past  - Obtain CTA to delineate possibly infection and r/u PE  - CBC and BMET in the AM   SOB/LE edema:  Differentials include CHF exacerbation vs PE vs ACS vs infection vs. COPD exacerbation.  On exam, patient with obvious signs of fluid overload with 1+  edema bilaterally to the knee and tight appearing skin. Lung exam with slight bibasilar crackles. Last echo 2017 with EF 60-65%, G2DD.  BNP elevated to 150, previously on 5/9 36.3.  Wells score of 4.5 but difficult to assess calf tenderness due to lack of sensation from quadraplegia.  Patient at home on 3L O2 currently in the ED on 6 L per [place] with 100% saturation.  No wheezing noted on exam, making COPD less likely, unlikely ACS given lack of chest pain. Patient likely with a combination of possibly infection and CHF exacerbation.  - Obtain CTA to rule out PE - Obtain ECHO  - Continue to trend troponin x3 - Obtain AM EKG - Daily weights  - Intake and output  - Will need diuresis - holding home lasix given possibly acute infection - consider restarting tomorrow morning   Lower extremity edema: Worsening edema over the past month. Currently of 20 PO Lasix. Hgb 12.0/ Albumin 3.2. Feel this is likely part of CHF exacerbation, thought patient denies any improvement with increasing lasix  - Will obtain doppler U/S  - Consider adding diuretic tomorrow morning   Abdominal Distention: Patient denies constipation, however states she generally has a bowel movement every 3 days. She is due for a bowl movement per her. Likely distention due to constipation  - Provide bowel regiment  - If continues to worse could consider abdominal ultrasound   Slight transaminates: Viral vs. CHF exacerbation. AST  75 ALT 77. Previously noted elevations in the past year. No previous hepatitis panel found  - Will obtain hepatitis panel  - Obtain PT/INR  - Could consider abdominal ultrasound in future if no improvement of distention  - Consider if diuresis will result in improvement   Traumatic spinal injury with quadriplegia and associated chronic pain: Cervical Spine injury  in 2015. 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 - Continue oxycodone IR 10 mg every 4 hour - Baclofen 20 mg 3 times a day - Gabapentin 400 mg 4 times a day - Zanaflex 4 mg 3 times a day  GERD - Continue home Zantac   Tracheotomy At home on 3L continuous oxygen.   - Night time trachea collar with 3 Liters by trachea while sleeping   Hypotension: On admission hypotensive however per patient and home aide, she usually runs low ~85/50.  - Continue home midodrine   Stage III-IV sacral decubitus ulcers.  Patient is chronically bed-ridden 2/2 quadriplegia.  No signs of infection  -  Wound care consult    Esophageal Dysphagia  - Tube feeds over 10 hours  - Consults to nutrition   Tobacco Abuse.  Notes she is a current everyday smoker. Smokes about 1 PPD x 20 years.   - 21 mg transdermal Nicotine patch daily   Asthma vs COPD. Denies any hx of COPD. Asthma noted in chart review. Patient denies any  - Continue home Spiriva  - Add albuterol as needed   FEN/GI: Regular diet, Osmolite per tube x 10h/day  Prophylaxis: Lovenox  Disposition: Admit to stepdown, under attending Dr. McDiarmid   History of Present Illness:  Angela Page is a 46 y.o. female presenting with SOB and fever. Patient states that she developed edema in her legs about 1 month ago. She went to [facility] where they increased her Lasix from 20 mg daily to 40 mg every other day. She initially had good output from this however had in the past week not had as significant output. About 1 week ago she developed fevers, with her first fever noted last Friday. She states she had a  Tmax  Of 101.8 at home. She indicates having a history of UTIs and notes having cloudy urine. She denies any other symptoms of UTI however feels like she's had decreased output over the last couple of days. Today she developed shortness of breath and was running a fever this morning. Her home health nurse provided  both ibuprofen and Tylenol  for this fever before coming to the ED for follow up. Her aide did obtain a UA about 2 weeks ago which was positive for leukocytes and nitrite and had not been treated for this UA. She does have an indwelling catheter and has been told in the past that her UA will always look infected. She indicates that she is more congested, though denies any worsening cough. She does note some nausea however denies vomiting she states that she has nauseated every once in a while. Denies any chest pain, orthopnea or PND    Review Of Systems: Per HPI with the following additions:   Review of Systems  Constitutional: Positive for chills and fever.  HENT: Positive for congestion.   Respiratory: Positive for shortness of breath. Negative for cough.   Cardiovascular: Positive for leg swelling. Negative for chest pain, orthopnea and PND.  Gastrointestinal: Positive for nausea. Negative for abdominal pain and vomiting.  Musculoskeletal: Negative for myalgias.  Skin: Positive for rash.  Neurological: Negative  for headaches.    Patient Active Problem List   Diagnosis Date Noted  . Tracheostomy dependence ([place])   . Chronic obstructive pulmonary disease ([place])   . Chronic diastolic CHF (congestive heart failure) ([place]) 01/23/2016  . Tracheostomy status ([place])   . Palliative care encounter   . Goals of care, counseling/discussion   . Esophageal dysphagia   . Shortness of breath   . Acute respiratory failure with hypoxemia ([place])   . Encounter for intubation   . Malnutrition of moderate degree 11/01/2015  . Acute encephalopathy 10/31/2015  . Tobacco abuse 10/31/2015  . Asthma 10/31/2015  . Pressure ulcer 02/26/2015  . HCAP (healthcare-associated pneumonia) 02/25/2015  . Lower urinary tract infectious disease 02/25/2015  . Hypotension 02/25/2015  . Anemia 02/25/2015  . Enterococcus UTI 02/03/2015  . Sepsis ([place]) 01/31/2015  . RLL pneumonia ([place]) 01/31/2015  . Altered mental status 10/29/2014  .  Encephalopathy, toxic 10/29/2014  . Toxic metabolic encephalopathy 16/10/9602  . Urinary tract infection due to Proteus 10/24/2014  . Leukocytosis 10/23/2014  . Hypokalemia 10/23/2014  . Decubitus ulcer of sacral region, stage 4 ([place]) 10/23/2014  . Anemia, iron deficiency 10/23/2014  . Severe sepsis ([place])   . Septic shock ([place]) 10/22/2014  . Quadriplegia, C5-C7 incomplete ([place]) 10/22/2014  . Depression   . Protein-calorie malnutrition, severe ([place]) 09/05/2014  . Overdose of opiate or related narcotic 09/02/2014    Past Medical History: Past Medical History:  Diagnosis Date  . Asthma   . GERD (gastroesophageal reflux disease)   . Pacemaker   . Pleurisy   . Protein calorie malnutrition ([place]) 10/31/2015  . Quadriparesis ([place])   . Quadriplegia, C5-C7 incomplete ([place]) 10/08/2013  . Stage IV pressure ulcer of sacral region [facility]) 10/31/2015    Past Surgical History: Past Surgical History:  Procedure Laterality Date  . APPENDECTOMY    . BACK SURGERY  10/08/2013   fusion of spine, cervical region  . CARDIAC SURGERY    . GASTROSTOMY  11/23/2015  . I&D EXTREMITY  10/28/2011   Procedure: IRRIGATION AND DEBRIDEMENT EXTREMITY;  Surgeon: Tennis Must, MD;  Location: [facility];  Service: Orthopedics;  Laterality: Right;  Irrigation and debridement right middle finger  . IR GENERIC HISTORICAL  11/23/2015   IR GASTROSTOMY TUBE MOD SED 11/23/2015 [facility]-INTERV RAD  . TRACHEOSTOMY      Social History: Social History  Substance Use Topics  . Smoking status: Current Every Day Smoker    Packs/day: 1.00    Years: 25.00    Types: Cigarettes  . Smokeless tobacco: Never Used  . Alcohol use Yes     Comment: 2 x weekly   Additional social history:   Please also refer to relevant sections of EMR.  Family History: Family History  Problem Relation Age of Onset  . Hypertension Maternal Uncle    (If not completed, MUST add something in)  Allergies and Medications: Allergies   Allergen Reactions  . Erythromycin Nausea And Vomiting  . Nitrofurantoin Monohyd Macro Nausea And Vomiting  . Penicillins Hives    Has patient had a PCN reaction causing immediate rash, facial/tongue/throat swelling, SOB or lightheadedness with hypotension: Yes Has patient had a PCN reaction causing severe rash involving mucus membranes or skin necrosis: Yes Has patient had a PCN reaction that required hospitalization Yes- went to the DR Has patient had a PCN reaction occurring within the last 10 years: No-childhood allergy If all of the above answers are "NO", then may proceed with Cephalosporin use.  No current facility-administered medications on file prior to encounter.    Current Outpatient Prescriptions on File Prior to Encounter  Medication Sig Dispense Refill  . acetaminophen (TYLENOL) 500 MG tablet Place 1 tablet (500 mg total) into feeding tube every 6 (six) hours as needed for moderate pain. (Patient taking differently: Place 500-1,000 mg into feeding tube every 6 (six) hours as needed for moderate pain. ) 30 tablet 0  . albuterol (PROVENTIL HFA;VENTOLIN HFA) 108 (90 BASE) MCG/ACT inhaler Inhale 2 puffs into the lungs every 6 (six) hours as needed for wheezing or shortness of breath.     . Amino Acids-Protein Hydrolys (FEEDING SUPPLEMENT, PRO-STAT SUGAR FREE 64,) LIQD Place 30 mLs into feeding tube 2 (two) times daily. (Patient taking differently: Place 30 mLs into feeding tube daily. ) 900 mL 0  . baclofen (LIORESAL) 20 MG tablet Take 20 mg by mouth 3 (three) times daily.  3  . CVS VITAMIN B12 1000 MCG tablet Place 1 tablet (1,000 mcg total) into feeding tube daily. 30 tablet 0  . DULoxetine (CYMBALTA) 30 MG capsule Take 30 mg by mouth 2 (two) times daily.    . furosemide (LASIX) 20 MG tablet Take 1 tablet (20 mg total) by mouth daily. 20 tablet 0  . gabapentin (NEURONTIN) 400 MG capsule Take 400 mg by mouth 4 (four) times daily.  3  . ibuprofen (ADVIL,MOTRIN) 200 MG tablet  Place 2 tablets (400 mg total) into feeding tube every 6 (six) hours as needed for moderate pain. 30 tablet 0  . lidocaine (LIDODERM) 5 % Place 1 patch onto the skin 2 (two) times daily as needed (pain).   0  . midodrine (PROAMATINE) 10 MG tablet Place 1 tablet (10 mg total) into feeding tube 3 (three) times daily with meals. 90 tablet 0  . neomycin-bacitracin-polymyxin (NEOSPORIN) ointment Apply 1 application topically daily. apply to eye (Patient taking differently: Apply 1 application topically daily as needed for wound care. apply to eye) 15 g 0  . Nutritional Supplements (FEEDING SUPPLEMENT, OSMOLITE 1.5 CAL,) LIQD Place 1,000 mLs into feeding tube daily. 1000 mL 0  . oxyCODONE (OXY IR/ROXICODONE) 5 MG immediate release tablet Place 1 tablet (5 mg total) into feeding tube every 6 (six) hours as needed for moderate pain or severe pain. (Patient taking differently: Place 10 mg into feeding tube every 4 (four) hours as needed for moderate pain or severe pain. ) 30 tablet 0  . polyethylene glycol (MIRALAX / GLYCOLAX) packet Place 17 g into feeding tube daily. (Patient taking differently: Place 17 g into feeding tube daily as needed for moderate constipation. ) 14 each 0  . silver sulfADIAZINE (SILVADENE) 1 % cream Apply 1 application topically every other day as needed (for wound care).    . temazepam (RESTORIL) 7.5 MG capsule Take 7.5 mg by mouth at bedtime.    Marland Kitchen tiotropium (SPIRIVA HANDIHALER) 18 MCG inhalation capsule Place 1 capsule (18 mcg total) into inhaler and inhale daily. 30 capsule 12  . tiZANidine (ZANAFLEX) 4 MG tablet Take 4 mg by mouth 3 (three) times daily.    . Water For Irrigation, Sterile (FREE WATER) SOLN Place 100 mLs into feeding tube every 8 (eight) hours. 1000 mL 0  . zinc oxide (BALMEX) 11.3 % CREA cream Apply 1 application topically 3 (three) times daily as needed (for skin redness/irritation).    Marland Kitchen albuterol (PROVENTIL) (2.5 MG/3ML) 0.083% nebulizer solution Take 2.5 mg by  nebulization every 6 (six) hours as needed for wheezing  or shortness of breath.    . clonazePAM (KLONOPIN) 0.5 MG disintegrating tablet Place 1 tablet (0.5 mg total) into feeding tube at bedtime. (Patient not taking: Reported on 06/09/2016) 30 tablet 0  . collagenase (SANTYL) ointment Apply topically daily. (Patient taking differently: Apply 1 application topically daily as needed (wound care). ) 15 g 0  . furosemide (LASIX) 20 MG tablet Take 1 tablet (20 mg total) by mouth every other day. (Patient not taking: Reported on 06/09/2016) 30 tablet 2  . ranitidine (ZANTAC) 150 MG/10ML syrup Place 10 mLs (150 mg total) into feeding tube every 12 (twelve) hours. (Patient taking differently: Place 150 mg into feeding tube 2 (two) times daily as needed for heartburn. ) 300 mL 0    Objective: BP (!) 68/57   Pulse 80   Temp 98.8 F (37.1 C) (Oral)   Resp 12   LMP 05/27/2016 (Within Days) Comment: Irregular periods since October 2015.  SpO2 98%  Exam: General: Lying in bed, middle aged women, NAD  Eyes: PERRLA ENTM: Moist mucosa membranes, slight nasal congestion  Neck: trachea in place, no lymphadenopathy  Cardiovascular: RRR, 3/6 holosystolic murmur  Respiratory: No increased WOB, Bibasilar crackles noted, no wheezes or rhonchi  Gastrointestinal: BS+ no ttp, feeding tube in place, distended  MSK: Upper and lower extremities contracted, edema in mid-thigh noted in bilateral legs,  Derm: 2 sacral decubitus ulcers noted, no surrounding erthyema or pus, no signs of infection  Neuro: No sensation or movement in lower extremities, contracture in feet and hands. Slight 1/5 strength in upper extremities  Psych: Appropriate affect, normal cognition   Labs and Imaging: CBC BMET   Recent Labs Lab 06/09/16 1344  WBC 17.4*  HGB 12.0  HCT 37.1  PLT 249    Recent Labs Lab 06/09/16 1344  NA 134*  K 4.0  CL 93*  CO2 30  BUN 23*  CREATININE 0.48  GLUCOSE 138*  CALCIUM 9.4     Dg Chest Portable  1 View  Result Date: 06/09/2016 CLINICAL DATA:  Edema, fever, short of breath EXAM: PORTABLE CHEST 1 VIEW COMPARISON:  05/28/2016 FINDINGS: Mild vascular congestion has progressed. Progression of small right effusion and right lower lobe airspace disease. Tracheostomy in good position.  Dual lead pacemaker unchanged. IMPRESSION: Progression of vascular congestion and small right effusion most likely due to fluid overload. Progression of right lower lobe atelectasis. Electronically Signed   By: Franchot Gallo M.D.   On: 06/09/2016 14:38    Tonette Bihari, MD 06/09/2016, 7:54 PM PGY-2, [facility] pager: [phone], text pages welcome

## 2016-06-09 NOTE — ED Provider Notes (Addendum)
[facility] DEPT Provider Note   CSN: [id number] Arrival date & time: 06/09/16  1313     History   Chief Complaint Chief Complaint  Patient presents with  . Leg Swelling    HPI Angela Page is a 46 y.o. female.  HPI   46 year old female with history of quadriplegia, stage IV pressure ulcer, history of recurrent UTIs, who presents with fever and diffuse swelling. Patient states that for the last 3 weeks, she has had progressively worsening lower extremity edema as well as abdominal swelling and distention. She has also had mild increase in cough and shortness of breath as well as increasingly cloudy urine. She was seen at [facility] on 5/9 for these symptoms and her Lasix was increased. She states that she has been taking Lasix since then, but has had no improvement in her swelling. Over the last 48 hours, she has had decreased urine output as well as increasing fatigue. She has had associated fevers over the last 24 hours as well.  Past Medical History:  Diagnosis Date  . Asthma   . GERD (gastroesophageal reflux disease)   . Pacemaker   . Pleurisy   . Protein calorie malnutrition ([place]) 10/31/2015  . Quadriparesis ([place])   . Quadriplegia, C5-C7 incomplete ([place]) 10/08/2013  . Stage IV pressure ulcer of sacral region [facility]) 10/31/2015    Patient Active Problem List   Diagnosis Date Noted  . Tracheostomy dependence ([place])   . Chronic obstructive pulmonary disease ([place])   . Chronic diastolic CHF (congestive heart failure) ([place]) 01/23/2016  . Tracheostomy status ([place])   . Palliative care encounter   . Goals of care, counseling/discussion   . Esophageal dysphagia   . Shortness of breath   . Acute respiratory failure with hypoxemia ([place])   . Encounter for intubation   . Malnutrition of moderate degree 11/01/2015  . Acute encephalopathy 10/31/2015  . Tobacco abuse 10/31/2015  . Asthma 10/31/2015  . Pressure ulcer 02/26/2015  . HCAP (healthcare-associated pneumonia)  02/25/2015  . Lower urinary tract infectious disease 02/25/2015  . Hypotension 02/25/2015  . Anemia 02/25/2015  . Enterococcus UTI 02/03/2015  . Sepsis ([place]) 01/31/2015  . RLL pneumonia ([place]) 01/31/2015  . Altered mental status 10/29/2014  . Encephalopathy, toxic 10/29/2014  . Toxic metabolic encephalopathy 80/99/8338  . Urinary tract infection due to Proteus 10/24/2014  . Leukocytosis 10/23/2014  . Hypokalemia 10/23/2014  . Decubitus ulcer of sacral region, stage 4 ([place]) 10/23/2014  . Anemia, iron deficiency 10/23/2014  . Severe sepsis ([place])   . Septic shock ([place]) 10/22/2014  . Quadriplegia, C5-C7 incomplete ([place]) 10/22/2014  . Depression   . Protein-calorie malnutrition, severe ([place]) 09/05/2014  . Overdose of opiate or related narcotic 09/02/2014    Past Surgical History:  Procedure Laterality Date  . APPENDECTOMY    . BACK SURGERY  10/08/2013   fusion of spine, cervical region  . CARDIAC SURGERY    . GASTROSTOMY  11/23/2015  . I&D EXTREMITY  10/28/2011   Procedure: IRRIGATION AND DEBRIDEMENT EXTREMITY;  Surgeon: Tennis Must, MD;  Location: [facility];  Service: Orthopedics;  Laterality: Right;  Irrigation and debridement right middle finger  . IR GENERIC HISTORICAL  11/23/2015   IR GASTROSTOMY TUBE MOD SED 11/23/2015 [facility] RAD  . TRACHEOSTOMY      OB History    No data available       Home Medications    Prior to Admission medications   Medication Sig Start Date End Date Taking? Authorizing  Provider  acetaminophen (TYLENOL) 500 MG tablet Place 1 tablet (500 mg total) into feeding tube every 6 (six) hours as needed for moderate pain. Patient taking differently: Place 500-1,000 mg into feeding tube every 6 (six) hours as needed for moderate pain.  11/26/15  Yes Florencia Reasons, MD  albuterol (PROVENTIL HFA;VENTOLIN HFA) 108 (90 BASE) MCG/ACT inhaler Inhale 2 puffs into the lungs every 6 (six) hours as needed for wheezing or shortness of breath.    Yes  [provider]  Amino Acids-Protein Hydrolys (FEEDING SUPPLEMENT, PRO-STAT SUGAR FREE 64,) LIQD Place 30 mLs into feeding tube 2 (two) times daily. Patient taking differently: Place 30 mLs into feeding tube daily.  11/26/15  Yes Florencia Reasons, MD  baclofen (LIORESAL) 20 MG tablet Take 20 mg by mouth 3 (three) times daily. 12/14/15  Yes [provider]  [employer] VITAMIN B12 1000 MCG tablet Place 1 tablet (1,000 mcg total) into feeding tube daily. 11/26/15  Yes Florencia Reasons, MD  DULoxetine (CYMBALTA) 30 MG capsule Take 30 mg by mouth 2 (two) times daily. 11/30/15  Yes [provider]  furosemide (LASIX) 20 MG tablet Take 1 tablet (20 mg total) by mouth daily. 05/28/16  Yes Mackuen, Courteney Lyn, MD  gabapentin (NEURONTIN) 400 MG capsule Take 400 mg by mouth 4 (four) times daily. 10/16/15  Yes [provider]  ibuprofen (ADVIL,MOTRIN) 200 MG tablet Place 2 tablets (400 mg total) into feeding tube every 6 (six) hours as needed for moderate pain. 11/26/15  Yes Florencia Reasons, MD  lidocaine (LIDODERM) 5 % Place 1 patch onto the skin 2 (two) times daily as needed (pain).  04/04/15  Yes [provider]  midodrine (PROAMATINE) 10 MG tablet Place 1 tablet (10 mg total) into feeding tube 3 (three) times daily with meals. 11/26/15  Yes Florencia Reasons, MD  neomycin-bacitracin-polymyxin (NEOSPORIN) ointment Apply 1 application topically daily. apply to eye Patient taking differently: Apply 1 application topically daily as needed for wound care. apply to eye 11/26/15  Yes Florencia Reasons, MD  Nutritional Supplements (FEEDING SUPPLEMENT, OSMOLITE 1.5 CAL,) LIQD Place 1,000 mLs into feeding tube daily. 11/27/15  Yes Florencia Reasons, MD  oxyCODONE (OXY IR/ROXICODONE) 5 MG immediate release tablet Place 1 tablet (5 mg total) into feeding tube every 6 (six) hours as needed for moderate pain or severe pain. Patient taking differently: Place 10 mg into feeding tube every 4 (four) hours as needed for moderate pain or severe  pain.  01/23/16  Yes Darrick Meigs, Marge Duncans, MD  polyethylene glycol (MIRALAX / GLYCOLAX) packet Place 17 g into feeding tube daily. Patient taking differently: Place 17 g into feeding tube daily as needed for moderate constipation.  11/27/15  Yes Florencia Reasons, MD  silver sulfADIAZINE (SILVADENE) 1 % cream Apply 1 application topically every other day as needed (for wound care).   Yes [provider]  temazepam (RESTORIL) 7.5 MG capsule Take 7.5 mg by mouth at bedtime. 05/21/16  Yes [provider]  tiotropium (SPIRIVA HANDIHALER) 18 MCG inhalation capsule Place 1 capsule (18 mcg total) into inhaler and inhale daily. 02/20/16  Yes Erick Colace, NP  tiZANidine (ZANAFLEX) 4 MG tablet Take 4 mg by mouth 3 (three) times daily. 11/30/15  Yes [provider]  Water For Irrigation, Sterile (FREE WATER) SOLN Place 100 mLs into feeding tube every 8 (eight) hours. 11/26/15  Yes Florencia Reasons, MD  zinc oxide (BALMEX) 11.3 % CREA cream Apply 1 application topically 3 (three) times daily as needed (for  skin redness/irritation).   Yes [provider]  albuterol (PROVENTIL) (2.5 MG/3ML) 0.083% nebulizer solution Take 2.5 mg by nebulization every 6 (six) hours as needed for wheezing or shortness of breath.    [provider]  clonazePAM (KLONOPIN) 0.5 MG disintegrating tablet Place 1 tablet (0.5 mg total) into feeding tube at bedtime. Patient not taking: Reported on 06/09/2016 01/23/16   Oswald Hillock, MD  collagenase (SANTYL) ointment Apply topically daily. Patient taking differently: Apply 1 application topically daily as needed (wound care).  11/26/15   Florencia Reasons, MD  furosemide (LASIX) 20 MG tablet Take 1 tablet (20 mg total) by mouth every other day. Patient not taking: Reported on 06/09/2016 01/23/16   Oswald Hillock, MD  ranitidine (ZANTAC) 150 MG/10ML syrup Place 10 mLs (150 mg total) into feeding tube every 12 (twelve) hours. Patient taking differently: Place 150 mg into feeding tube 2 (two)  times daily as needed for heartburn.  11/26/15   Florencia Reasons, MD    Family History Family History  Problem Relation Age of Onset  . Hypertension Maternal Uncle     Social History Social History  Substance Use Topics  . Smoking status: Current Every Day Smoker    Packs/day: 1.00    Years: 25.00    Types: Cigarettes  . Smokeless tobacco: Never Used  . Alcohol use Yes     Comment: 2 x weekly     Allergies   Erythromycin; Nitrofurantoin monohyd macro; and Penicillins   Review of Systems Review of Systems  Constitutional: Positive for fatigue and fever.  Respiratory: Positive for cough and shortness of breath.   Cardiovascular: Positive for leg swelling.  Genitourinary: Positive for decreased urine volume.  Neurological: Positive for weakness.  All other systems reviewed and are negative.    Physical Exam Updated Vital Signs BP 112/73   Pulse 80   Temp (!) 100.4 F (38 C) (Oral)   Resp 12   LMP 05/27/2016 (Within Days) Comment: Irregular periods since October 2015.  SpO2 100%   Physical Exam  Constitutional: She is oriented to person, place, and time. She appears well-developed and well-nourished. No distress.  HENT:  Head: Normocephalic and atraumatic.  Moderately dry MM  Eyes: Conjunctivae are normal.  Neck: Neck supple.  Trach site c/d/i, no drainage or bleeding  Cardiovascular: Normal rate, regular rhythm and normal heart sounds.  Exam reveals no friction rub.   No murmur heard. Pulmonary/Chest: Effort normal and breath sounds normal. No respiratory distress. She has no wheezes.  Transmitted upper airway sounds, no focal rhonchi or rales  Abdominal: Soft. She exhibits distension. There is no tenderness. There is no rebound and no guarding.  Genitourinary:  Genitourinary Comments: Foley in place draining cloudy urine. No surrounding erythema.  Musculoskeletal: She exhibits edema (2+ pitting edema to bilateral LE extending to abdomen).  Neurological: She is  alert and oriented to person, place, and time. She exhibits normal muscle tone.  Skin: Skin is warm. Capillary refill takes less than 2 seconds.  Multiple stage III-IV decub ulcers. No drainage or malodor. No purulence.  Psychiatric: She has a normal mood and affect.  Nursing note and vitals reviewed.    ED Treatments / Results  Labs (all labs ordered are listed, but only abnormal results are displayed) Labs Reviewed  COMPREHENSIVE METABOLIC PANEL - Abnormal; Notable for the following:       Result Value   Sodium 134 (*)    Chloride 93 (*)    Glucose, Bld  138 (*)    BUN 23 (*)    Albumin 3.2 (*)    AST 75 (*)    ALT 77 (*)    Alkaline Phosphatase 187 (*)    All other components within normal limits  CBC WITH DIFFERENTIAL/PLATELET - Abnormal; Notable for the following:    WBC 17.4 (*)    RBC 3.82 (*)    Neutro Abs 15.0 (*)    All other components within normal limits  URINALYSIS, ROUTINE W REFLEX MICROSCOPIC - Abnormal; Notable for the following:    APPearance TURBID (*)    Hgb urine dipstick MODERATE (*)    Protein, ur 30 (*)    Leukocytes, UA LARGE (*)    Bacteria, UA MANY (*)    Squamous Epithelial / LPF 0-5 (*)    All other components within normal limits  BRAIN NATRIURETIC PEPTIDE - Abnormal; Notable for the following:    B Natriuretic Peptide 150.8 (*)    All other components within normal limits  CULTURE, BLOOD (ROUTINE X 2)  CULTURE, BLOOD (ROUTINE X 2)  URINE CULTURE  CULTURE, EXPECTORATED SPUTUM-ASSESSMENT  GRAM STAIN  TROPONIN I  HIV ANTIBODY (ROUTINE TESTING)  TSH  TROPONIN I  TROPONIN I  TROPONIN I  BASIC METABOLIC PANEL  CBC  I-STAT CG4 LACTIC ACID, ED    EKG  EKG Interpretation  Date/Time:  Monday Jun 09 2016 15:10:28 EDT Ventricular Rate:  80 PR Interval:    QRS Duration: 94 QT Interval:  392 QTC Calculation: 453 R Axis:   52 Text Interpretation:  Sinus rhythm No significant change since last tracing Confirmed by Arta Stump MD, Kahil Agner  [phone]) on 06/09/2016 6:24:54 PM       Radiology Dg Chest Portable 1 View  Result Date: 06/09/2016 CLINICAL DATA:  Edema, fever, short of breath EXAM: PORTABLE CHEST 1 VIEW COMPARISON:  05/28/2016 FINDINGS: Mild vascular congestion has progressed. Progression of small right effusion and right lower lobe airspace disease. Tracheostomy in good position.  Dual lead pacemaker unchanged. IMPRESSION: Progression of vascular congestion and small right effusion most likely due to fluid overload. Progression of right lower lobe atelectasis. Electronically Signed   By: Franchot Gallo M.D.   On: 06/09/2016 14:38    Procedures Procedures (including critical care time)  Medications Ordered in ED Medications  vancomycin (VANCOCIN) IVPB 750 mg/150 ml premix (not administered)  meropenem (MERREM) 2 g in sodium chloride 0.9 % 100 mL IVPB (not administered)  temazepam (RESTORIL) capsule 7.5 mg (not administered)  tiotropium (SPIRIVA) inhalation capsule 18 mcg (not administered)  clonazePAM (KLONOPIN) disintegrating tablet 0.5 mg (not administered)  oxyCODONE (Oxy IR/ROXICODONE) immediate release tablet 10 mg (not administered)  baclofen (LIORESAL) tablet 20 mg (not administered)  DULoxetine (CYMBALTA) DR capsule 30 mg (not administered)  gabapentin (NEURONTIN) capsule 400 mg (not administered)  tiZANidine (ZANAFLEX) tablet 4 mg (not administered)  acetaminophen (TYLENOL) tablet 500 mg (not administered)  albuterol (PROVENTIL) (2.5 MG/3ML) 0.083% nebulizer solution 2.5 mg (not administered)  feeding supplement (PRO-STAT SUGAR FREE 64) liquid 30 mL (not administered)  collagenase (SANTYL) ointment 1 application (not administered)  midodrine (PROAMATINE) tablet 10 mg (not administered)  feeding supplement (OSMOLITE 1.5 CAL) liquid 1,000 mL (not administered)  ranitidine (ZANTAC) 150 MG/10ML syrup 150 mg (not administered)  free water 100 mL (not administered)  lidocaine (LIDODERM) 5 % 1 patch (not  administered)  albuterol (PROVENTIL HFA;VENTOLIN HFA) 108 (90 Base) MCG/ACT inhaler 2 puff (not administered)  enoxaparin (LOVENOX) injection 40 mg (not administered)  sodium  chloride flush (NS) 0.9 % injection 3 mL (not administered)  polyethylene glycol (MIRALAX / GLYCOLAX) packet 17 g (not administered)  ondansetron (ZOFRAN) tablet 4 mg (not administered)    Or  ondansetron (ZOFRAN) injection 4 mg (not administered)  iopamidol (ISOVUE-370) 76 % injection (not administered)  sodium chloride 0.9 % bolus 1,000 mL (0 mLs Intravenous Stopped 06/09/16 1505)  vancomycin (VANCOCIN) IVPB 1000 mg/200 mL premix (0 mg Intravenous Stopped 06/09/16 1533)  meropenem (MERREM) 2 g in sodium chloride 0.9 % 100 mL IVPB (0 g Intravenous Stopped 06/09/16 1533)  acetaminophen (TYLENOL) tablet 650 mg (650 mg Oral Given 06/09/16 1554)  midodrine (PROAMATINE) tablet 10 mg (10 mg Oral Given 06/09/16 1638)  oxyCODONE (Oxy IR/ROXICODONE) immediate release tablet 10 mg (10 mg Per Tube Given 06/09/16 1716)     Initial Impression / Assessment and Plan / ED Course  I have reviewed the triage vital signs and the nursing notes.  Pertinent labs & imaging results that were available during my care of the patient were reviewed by me and considered in my medical decision making (see chart for details).     46 year old female with past medical history as above here with fever and diffuse swelling. Exam and lab work is as above. I suspect patient has multiple issues including primarily sepsis secondary to UTI. Code sepsis initially activated given mild hypotension, although on further discussion as well as review of records, patient has chronic hypoension and is on Midodrinefor this. I've given her a dose here and she has had return to her normal blood pressures. Lactic acid is also normal and she is markedly hypervolemic on exam, so I do not feel patient requires final 500 mL of 1.5 L bolus. Patient is in agreement with this plan  and is hesitant to receive further fluids based on sugar decision-making. Otherwise, I do think patient is overtly hypervolemic although intravascularly dry. She will likely need possible fluids for sepsis until improved and then workup and diuresis for diastolic CHF. Will admit to medicine for further treatment.  Final Clinical Impressions(s) / ED Diagnoses   Final diagnoses:  Dyspnea  Distended abdomen  Sepsis due to urinary tract infection (HCC)  Leukocytosis, unspecified type  Peripheral edema  Acute on chronic diastolic congestive heart failure [facility])    New Prescriptions New Prescriptions   No medications on file     Duffy Bruce, MD 06/09/16 Karie Fetch, MD 06/09/16 [phone]

## 2016-06-09 NOTE — ED Notes (Signed)
Per pharmacy give vanc now, other antibiotic will come from pharmacy.

## 2016-06-09 NOTE — ED Triage Notes (Signed)
Per ems- pt comes from home where she is cared for my family and home health care team. She is quadriplegic with trach. Is c/o swelling to BLE x several weeks. She was seen at [facility] and increased her lasix. Has not helped and now swelling extends up to her abdomen. Home health checked her creatinine and was elevated. Has low fever and took tylenol and ibuprofen today. BP 131/70, HR 80, 96% 3 L, CBG 161. Denies any new pain. Has foley catheter urine output has decreased 600 cc/day from 1500 cc/day.

## 2016-06-09 NOTE — ED Notes (Signed)
Attempted report x1. 

## 2016-06-10 ENCOUNTER — Ambulatory Visit (HOSPITAL_COMMUNITY): Payer: Medicare Other

## 2016-06-10 ENCOUNTER — Inpatient Hospital Stay (HOSPITAL_COMMUNITY): Payer: Medicare Other

## 2016-06-10 DIAGNOSIS — E43 Unspecified severe protein-calorie malnutrition: Secondary | ICD-10-CM

## 2016-06-10 DIAGNOSIS — R601 Generalized edema: Secondary | ICD-10-CM

## 2016-06-10 DIAGNOSIS — R0600 Dyspnea, unspecified: Secondary | ICD-10-CM

## 2016-06-10 DIAGNOSIS — G8254 Quadriplegia, C5-C7 incomplete: Secondary | ICD-10-CM

## 2016-06-10 DIAGNOSIS — L89154 Pressure ulcer of sacral region, stage 4: Secondary | ICD-10-CM

## 2016-06-10 DIAGNOSIS — Z950 Presence of cardiac pacemaker: Secondary | ICD-10-CM | POA: Diagnosis present

## 2016-06-10 DIAGNOSIS — Z930 Tracheostomy status: Secondary | ICD-10-CM

## 2016-06-10 DIAGNOSIS — N319 Neuromuscular dysfunction of bladder, unspecified: Secondary | ICD-10-CM

## 2016-06-10 DIAGNOSIS — E8809 Other disorders of plasma-protein metabolism, not elsewhere classified: Secondary | ICD-10-CM | POA: Diagnosis present

## 2016-06-10 DIAGNOSIS — R748 Abnormal levels of other serum enzymes: Secondary | ICD-10-CM

## 2016-06-10 DIAGNOSIS — Z978 Presence of other specified devices: Secondary | ICD-10-CM

## 2016-06-10 DIAGNOSIS — J189 Pneumonia, unspecified organism: Secondary | ICD-10-CM

## 2016-06-10 DIAGNOSIS — Z79891 Long term (current) use of opiate analgesic: Secondary | ICD-10-CM

## 2016-06-10 DIAGNOSIS — J449 Chronic obstructive pulmonary disease, unspecified: Secondary | ICD-10-CM

## 2016-06-10 DIAGNOSIS — I9589 Other hypotension: Secondary | ICD-10-CM

## 2016-06-10 DIAGNOSIS — R609 Edema, unspecified: Secondary | ICD-10-CM

## 2016-06-10 DIAGNOSIS — Z931 Gastrostomy status: Secondary | ICD-10-CM

## 2016-06-10 DIAGNOSIS — I272 Pulmonary hypertension, unspecified: Secondary | ICD-10-CM

## 2016-06-10 DIAGNOSIS — Z960 Presence of urogenital implants: Secondary | ICD-10-CM

## 2016-06-10 DIAGNOSIS — G909 Disorder of the autonomic nervous system, unspecified: Secondary | ICD-10-CM | POA: Diagnosis present

## 2016-06-10 DIAGNOSIS — R140 Abdominal distension (gaseous): Secondary | ICD-10-CM

## 2016-06-10 HISTORY — DX: Generalized edema: R60.1

## 2016-06-10 LAB — BASIC METABOLIC PANEL
Anion gap: 8 (ref 5–15)
BUN: 16 mg/dL (ref 6–20)
CALCIUM: 9.6 mg/dL (ref 8.9–10.3)
CO2: 36 mmol/L — ABNORMAL HIGH (ref 22–32)
CREATININE: 0.45 mg/dL (ref 0.44–1.00)
Chloride: 95 mmol/L — ABNORMAL LOW (ref 101–111)
GFR calc Af Amer: >60 mL/min (ref >60)
GFR calc non Af Amer: >60 mL/min (ref >60)
GLUCOSE: 168 mg/dL — AB (ref 65–99)
Potassium: 4 mmol/L (ref 3.5–5.1)
Sodium: 139 mmol/L (ref 135–145)

## 2016-06-10 LAB — CBC
HEMATOCRIT: 34.7 % — AB (ref 36.0–46.0)
Hemoglobin: 11.1 g/dL — ABNORMAL LOW (ref 12.0–15.0)
MCH: 31.3 pg (ref 26.0–34.0)
MCHC: 32 g/dL (ref 30.0–36.0)
MCV: 97.7 fL (ref 78.0–100.0)
PLATELETS: 206 10*3/uL (ref 150–400)
RBC: 3.55 MIL/uL — AB (ref 3.87–5.11)
RDW: 13 % (ref 11.5–15.5)
WBC: 11.2 10*3/uL — AB (ref 4.0–10.5)

## 2016-06-10 LAB — HEPATIC FUNCTION PANEL
ALK PHOS: 174 U/L — AB (ref 38–126)
ALT: 72 U/L — AB (ref 14–54)
AST: 61 U/L — ABNORMAL HIGH (ref 15–41)
Albumin: 2.7 g/dL — ABNORMAL LOW (ref 3.5–5.0)
BILIRUBIN DIRECT: 0.2 mg/dL (ref 0.1–0.5)
BILIRUBIN INDIRECT: 0.1 mg/dL — AB (ref 0.3–0.9)
BILIRUBIN TOTAL: 0.3 mg/dL (ref 0.3–1.2)
Total Protein: 6.4 g/dL — ABNORMAL LOW (ref 6.5–8.1)

## 2016-06-10 LAB — URINE CULTURE

## 2016-06-10 LAB — STREP PNEUMONIAE URINARY ANTIGEN: Strep Pneumo Urinary Antigen: NEGATIVE

## 2016-06-10 LAB — VITAMIN B12: Vitamin B-12: 2890 pg/mL — ABNORMAL HIGH (ref 180–914)

## 2016-06-10 LAB — PROTIME-INR
INR: 1.01
Prothrombin Time: 13.3 seconds (ref 11.4–15.2)

## 2016-06-10 LAB — PROTEIN / CREATININE RATIO, URINE
CREATININE, URINE: 20.28 mg/dL
PROTEIN CREATININE RATIO: 3.35 mg/mg{creat} — AB (ref 0.00–0.15)
TOTAL PROTEIN, URINE: 68 mg/dL

## 2016-06-10 LAB — APTT: APTT: 32 s (ref 24–36)

## 2016-06-10 LAB — ECHOCARDIOGRAM COMPLETE
HEIGHTINCHES: 62 in
WEIGHTICAEL: 1904.77 [oz_av]

## 2016-06-10 LAB — TROPONIN I

## 2016-06-10 LAB — HIV ANTIBODY (ROUTINE TESTING W REFLEX): HIV SCREEN 4TH GENERATION: NONREACTIVE

## 2016-06-10 LAB — PREALBUMIN: PREALBUMIN: 14 mg/dL — AB (ref 18–38)

## 2016-06-10 MED ORDER — STARCH (THICKENING) PO POWD
ORAL | Status: DC | PRN
  Filled 2016-06-10: qty 227

## 2016-06-10 MED ORDER — CLOTRIMAZOLE 10 MG MT TROC
10.0000 mg | Freq: Three times a day (TID) | OROMUCOSAL | Status: DC
  Administered 2016-06-10 – 2016-06-13 (×10): 10 mg via ORAL
  Filled 2016-06-10 (×14): qty 1

## 2016-06-10 MED ORDER — LORAZEPAM 0.5 MG PO TABS
0.5000 mg | ORAL_TABLET | Freq: Three times a day (TID) | ORAL | Status: DC | PRN
  Administered 2016-06-10 – 2016-06-12 (×5): 0.5 mg via ORAL
  Filled 2016-06-10 (×5): qty 1

## 2016-06-10 MED ORDER — RESOURCE THICKENUP CLEAR PO POWD
ORAL | Status: DC | PRN
  Filled 2016-06-10: qty 125

## 2016-06-10 MED ORDER — LORAZEPAM 0.5 MG PO TABS: 0.5000 mg | ORAL_TABLET | Freq: Three times a day (TID) | ORAL | Status: DC | PRN

## 2016-06-10 MED ORDER — FUROSEMIDE 10 MG/ML IJ SOLN
20.0000 mg | Freq: Once | INTRAMUSCULAR | Status: AC
  Administered 2016-06-10: 20 mg via INTRAVENOUS
  Filled 2016-06-10: qty 2

## 2016-06-10 MED ORDER — NICOTINE 21 MG/24HR TD PT24
21.0000 mg | MEDICATED_PATCH | Freq: Every day | TRANSDERMAL | Status: DC
  Administered 2016-06-10 – 2016-06-13 (×4): 21 mg via TRANSDERMAL
  Filled 2016-06-10 (×4): qty 1

## 2016-06-10 MED ORDER — OSMOLITE 1.2 CAL PO LIQD
1000.0000 mL | ORAL | Status: DC
  Administered 2016-06-10 – 2016-06-13 (×3): 1000 mL
  Filled 2016-06-10 (×8): qty 1000

## 2016-06-10 NOTE — Evaluation (Addendum)
Clinical/Bedside Swallow Evaluation Patient Details  Name: Angela Page MRN: [id number] Date of Birth: 1970-11-09  Today's Date: 06/10/2016 Time: SLP Start Time (ACUTE ONLY): 1200 SLP Stop Time (ACUTE ONLY): 1320 SLP Time Calculation (min) (ACUTE ONLY): 80 min  Past Medical History:  Past Medical History:  Diagnosis Date  . Asthma   . GERD (gastroesophageal reflux disease)   . Pacemaker   . Pleurisy   . Protein calorie malnutrition ([place]) 10/31/2015  . Quadriparesis ([place])   . Quadriplegia, C5-C7 incomplete ([place]) 10/08/2013  . Stage IV pressure ulcer of sacral region [facility]) 10/31/2015   Past Surgical History:  Past Surgical History:  Procedure Laterality Date  . APPENDECTOMY    . BACK SURGERY  10/08/2013   fusion of spine, cervical region  . CARDIAC SURGERY    . GASTROSTOMY  11/23/2015  . I&D EXTREMITY  10/28/2011   Procedure: IRRIGATION AND DEBRIDEMENT EXTREMITY;  Surgeon: Tennis Must, MD;  Location: [facility];  Service: Orthopedics;  Laterality: Right;  Irrigation and debridement right middle finger  . IR GENERIC HISTORICAL  11/23/2015   IR GASTROSTOMY TUBE MOD SED 11/23/2015 [facility]  . TRACHEOSTOMY     HPI:  Angela Page a 46 y.o. femalepresenting with shortness of breath and LE swelling. PMH is significant for traumatic spinal injury with quadraplegia s/p fall with C2-7 fusion, recurrent UTIs, diastolic heart failure, stage 3-4 decubitus ulcers, pacemaker for sick sinus syndrome, tobacco abuse, GERD, and tracheotomy s/p ICU admission for respiratory failure due to PNA/UTI sepsis, on 3L [place] - continuous, Asthma. SOB and SIRS, concern for aspiration. Pt has a history of mild oral and oropharyngeal dysphagia with MBS in 01/22/16 showing residuals, increased with thicker viscosity, Pt encouraged to clear throat/expectorate residue.    Assessment / Plan / Recommendation Clinical Impression  Pt demosntrates multiple risk factors for aspiration including  dysphagia, total assist for feeding, PEG tube, bedbound status, tracheostomy and most significantly, poor oral hygiene. Pt reports her mother performs oral care 3x a week, removing her dentures only at those times. Pt had no awareness of increased need for oral care to reduce bacterial load. SLP discussed this at length with pt, mother and RNs. Pt with known mild oropharyngeal dysphagia, including pharyngeal residuals. This, combined with oral bacteria and recent increase in oral intake (pt started consuming solids in the past two weeks) could account for new multilobar pna. Will f/u with objective testing (FEES done while pt in bed) while admitted to determine if there are new strategies needed to reduce risk of aspiration. Until then pt is recommend on procced with water protocol, taking sips of water, or tea if desired, after oral care.  SLP Visit Diagnosis: Dysphagia, pharyngeal phase (R13.13)    Aspiration Risk  Moderate aspiration risk    Diet Recommendation Free water protocol after oral care   Liquid Administration via: Cup;Straw Medication Administration: Whole meds with liquid Supervision: Full supervision/cueing for compensatory strategies Compensations: Slow rate;Small sips/bites Postural Changes: Seated upright at 90 degrees;Remain upright for at least 30 minutes after po intake    Other  Recommendations Oral Care Recommendations: Oral care prior to ice chip/H20   Follow up Recommendations 24 hour supervision/assistance      Frequency and Duration min 2x/week  2 weeks       Prognosis Prognosis for Safe Diet Advancement: Good      Swallow Study   General HPI: Angela Page a 46 y.o. femalepresenting with shortness of breath and LE swelling. PMH is  significant for traumatic spinal injury with quadraplegia s/p fall with C2-7 fusion, recurrent UTIs, diastolic heart failure, stage 3-4 decubitus ulcers, pacemaker for sick sinus syndrome, tobacco abuse, GERD, and tracheotomy  s/p ICU admission for respiratory failure due to PNA/UTI sepsis, on 3L [place] - continuous, Asthma. SOB and SIRS, concern for aspiration. Pt has a history of mild oral and oropharyngeal dysphagia with MBS in 01/22/16 showing residuals, increased with thicker viscosity, Pt encouraged to clear throat/expectorate residue.  Type of Study: Bedside Swallow Evaluation Diet Prior to this Study: Dysphagia 1 (puree);Honey-thick liquids Temperature Spikes Noted: No Respiratory Status: Room air History of Recent Intubation: No Behavior/Cognition: Alert;Cooperative;Pleasant mood Oral Cavity Assessment: Within Functional Limits Oral Care Completed by SLP: No Oral Cavity - Dentition: Poor condition;Missing dentition Self-Feeding Abilities: Total assist Patient Positioning: Partially reclined Baseline Vocal Quality: Normal Volitional Cough: Strong Volitional Swallow: Able to elicit    Oral/Motor/Sensory Function Overall Oral Motor/Sensory Function: Within functional limits                                      Angela Baltimore, MA CCC-SLP [phone]  Angela Page, Angela Page 06/10/2016,1:47 PM

## 2016-06-10 NOTE — Progress Notes (Signed)
Awaiting pt's 3 am Vanc and [place]. Pharmacy has been notified

## 2016-06-10 NOTE — Progress Notes (Signed)
  Echocardiogram 2D Echocardiogram has been performed.  Angela Page 06/10/2016, 3:28 PM

## 2016-06-10 NOTE — Progress Notes (Signed)
Family Medicine Teaching Service Daily Progress Note Intern Pager: [phone]  Patient name: Angela Page Medical record number: [id number] Date of birth: [birth year] Age: 46 y.o. Gender: female  Primary Care Provider: System, Provider Not In Consultants: None  Code Status: FULL   Pt Overview and Major Events to Date:  Admit 5/22   Assessment and Plan: Angela Page is a 46 y.o. female presenting with shortness of breath and LE swelling. 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 Patient with a hx of UTIs and urine with nitrites/LE two weeks ago, though from indwelling catheter. Repeat UA in the ED without nitrites. Does endorse worsening cough with CXR noting vascular congestion, small right effusion, possibly the start of small right consolidation.  Noted to 2 sacral decubitus ulcers - stage 3, no signs of infection on examination. Patient noted to have low blood pressures  - per home aid SBP 85-100, DBP 50s, therefore lower blood pressure less concern for sepsis, LA 1.0. SOB with SIRS most concerning for PNA, less likely due to UTI.  - VSS, continue pulse oximetry  - Blood and urine cultures pending   - Continue vancomycin and meropenem- Day 2 (PCN allergy)  - Strep pneumonia negative; Legionella antigen pending  - Obtain trachea aspirate - culture and gram stain - hx of acintobacter positive in the past  - CTA to delineate possibly infection and r/o PE >> no evidence of PE, complete consolidation of RLL and dense airspace opacification involving portions of the right upper  and middle lobes. - CBC and BMET in the AM   SOB/LE edema: Differentials include CHF exacerbation vs PE vs ACS vs infection vs. COPD exacerbation.  On exam, patient with obvious signs of fluid overload with 1+  edema bilaterally to the knee and tight appearing skin. Lung exam with slight bibasilar crackles. Last echo 2017 with EF 60-65%, G2DD.  BNP elevated to 150, previously on 5/9 36.3.  Wells score of 4.5 but difficult to assess calf tenderness due to lack of sensation from quadraplegia.  Patient at home on 3L O2 currently in the ED on 6 L per [place] with 100% saturation.  No wheezing noted on exam, making COPD less likely, unlikely ACS given lack of chest pain. Patient likely with a combination of possibly infection and CHF exacerbation.  ACS ruled out with negative troponins x3.   -ECHO pending  - Daily weights  - Intake and output   Lower extremity edema: Worsening edema over the past month. Currently on 20 mg  PO Lasix. Hgb 12.0/ Albumin 3.2. Feel this is likely part of CHF exacerbation, thought patient denies any improvement with increasing lasix.  Patient with lower blood pressures but taking Midodrine which will help somewhat.  - Dopplers bilateral LE pending - Swelling appears slightly improved however with some pitting edema in bilateral LE and belly appears distended.   -Give IV lasix 20 mg x 1 this AM.  Will reassess fluid status and dose additional Lasix if needed.    Abdominal Distention: Patient denies constipation, however states she generally has a bowel movement every 3 days. She is due for  a bowl movement per her. Likely distention due to constipation.  - Provide bowel regimen  - If continues to worse could consider abdominal ultrasound   Slight transaminates: Viral vs. CHF exacerbation. AST 75 ALT 77. Previously noted elevations in the past year. No previous hepatitis panel found.  PT/INR normal.  - Will obtain hepatitis panel  - Could consider abdominal ultrasound in future  if no improvement of distention  - Consider if diuresis will result in improvement   Traumatic spinal injury with quadriplegia and associated chronic pain: Cervical Spine injury in 2015. 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 - Continue oxycodone IR 10 mg every 4 hour - Baclofen 20 mg 3 times a day.  Can consider increasing dose of Baclofen if needed.  - Gabapentin 400 mg 4 times a day - Zanaflex 4 mg 3 times a day  GERD - Continue home Zantac   Tracheotomy At home on 3L continuous oxygen.   - Night time trachea collar with 3 Liters by trachea while sleeping   Hypotension: On admission hypotensive however per patient and home aide, she usually runs low ~85/50.  - Continue home midodrine   Stage III-IV sacral decubitus ulcers.  Patient is chronically bed-ridden 2/2 quadriplegia.  No signs of infection  -  Wound care consult    Esophageal Dysphagia  - Tube feeds over 10 hours  - Consults to nutrition  -Dysphagia diet   Tobacco Abuse.  Notes she is a current everyday smoker. Smokes about 1 PPD x 20 years.   - 21 mg transdermal Nicotine patch daily   Asthma vs COPD. Denies any hx of COPD. Asthma noted in chart review. Patient denies any  - Continue home Spiriva  - Add albuterol as needed   FEN/GI: Regular diet, Osmolite per tube x 10h/day  Prophylaxis: Lovenox  Disposition: Continue to monitor   Subjective:  Patient comfortable this AM.  Has remained afebrile overnight.  Still feels fluid overloaded however notes it has gone down some.  Stomach feels distended.    Objective: Temp:  [98.1 F (36.7 C)-100.5 F (38.1 C)] 100.5 F (38.1 C) (05/22 1528) Pulse Rate:  [79-86] 82 (05/22 1520) Resp:  [12-17] 16 (05/22 1520) BP: (68-120)/(53-76) 109/69 (05/22 1500) SpO2:  [98 %-100 %] 100 % (05/22 1520) Weight:  [119 lb 0.8 oz (54 kg)] 119 lb 0.8 oz (54 kg) (05/22 0932) Physical Exam: General: 46 yo F lying in bed, NAD  Neck: trachea in place, no LAD   Cardiovascular: RRR, 3/6 holosystolic murmur  Respiratory: No increased WOB, mild crackles noted  Gastrointestinal: BS+ no ttp, feeding tube in place, distended  MSK: edema in LE improved, nonpitting  Derm: warm, dry  Neuro: AOx3, no sensation in LE bilaterally, does not move LE, 1/5 grip strength bilaterally  Psych: normal affect  Laboratory:  Recent Labs Lab 06/09/16 1344 06/10/16 0223  WBC 17.4* 11.2*  HGB 12.0 11.1*  HCT 37.1 34.7*  PLT 249 206    Recent Labs Lab 06/09/16 1344 06/10/16 0223  NA 134* 139  K 4.0 4.0  CL 93* 95*  CO2 30 36*  BUN 23* 16  CREATININE 0.48 0.45  CALCIUM 9.4 9.6  PROT 7.3 6.4*  BILITOT 0.4 0.3  ALKPHOS 187* 174*  ALT 77* 72*  AST 75* 61*  GLUCOSE 138* 168*    Imaging/Diagnostic Tests: Ct Angio Chest Pe W Or Wo Contrast  Result Date: 06/09/2016 CLINICAL DATA:  Acute onset of dyspnea.  Initial encounter. EXAM: CT ANGIOGRAPHY  CHEST WITH CONTRAST TECHNIQUE: Multidetector CT imaging of the chest was performed using the standard protocol during bolus administration of intravenous contrast. Multiplanar CT image reconstructions and MIPs were obtained to evaluate the vascular anatomy. CONTRAST:  100 mL of Isovue 370 IV contrast COMPARISON:  CTA of the chest performed 01/19/2016, and chest radiograph performed earlier today at 2:27 p.m. FINDINGS: Cardiovascular:  There is no evidence of pulmonary embolus. The heart is borderline normal in size. Pacemaker leads are noted ending at the right atrium and right ventricle. A left chest wall pacemaker is seen. Diffuse coronary artery calcifications are appreciated. There is minimal calcification along the aortic arch and proximal great vessels. Mediastinum/Nodes: A 1.4 cm subcarinal node is suggested. No additional mediastinal lymphadenopathy is seen. No pericardial effusion is identified. The patient's tracheostomy tube is grossly unremarkable in appearance. The visualized portions of the thyroid gland are  unremarkable. No axillary lymphadenopathy is appreciated. Lungs/Pleura: There is complete consolidation of the right lower lobe, and dense airspace opacification involving portions of the right upper and middle lobes. Minimal opacity is also noted at the left lower lobe and left lingula. Findings are compatible with bilateral pneumonia. A trace right-sided pleural effusion is noted. No pneumothorax is seen. No dominant mass is identified. Given partial opacification of the bronchioles to the right lower lobe, would correlate clinically for evidence of aspiration. Upper Abdomen: The visualized portions of the liver and spleen are grossly unremarkable. The visualized portions of the adrenal glands and kidneys are within normal limits. Musculoskeletal: No acute osseous abnormalities are identified. The visualized musculature is unremarkable in appearance. Review of the MIP images confirms the above findings. IMPRESSION: 1. No evidence of pulmonary embolus. 2. Complete consolidation of the right lower lung lobe, and dense airspace opacification involving portions of the right upper and middle lobes. Minimal airspace opacity at the left lower lobe and left lingula. Findings compatible with bilateral pneumonia. Trace right-sided pleural effusion noted. Given partial opacification of the bronchioles to the right lower lobe, underlying aspiration is suspected. Would correlate clinically. 3. Diffuse coronary artery calcifications noted. 4. 1.4 cm subcarinal node may reflect the underlying pneumonia. Electronically Signed   By: Garald Balding M.D.   On: 06/09/2016 21:15   US Abdomen Complete  Result Date: 06/10/2016 CLINICAL DATA:  Abnormal transaminases.  Anasarca EXAM: ABDOMEN ULTRASOUND COMPLETE COMPARISON:  11/12/2015 FINDINGS: Gallbladder: No gallstones or wall thickening visualized. No sonographic Murphy sign noted by sonographer. Common bile duct: Diameter: 4 mm. Where visualized, no filling defect. Liver: No focal  lesion identified. Within normal limits in parenchymal echogenicity. Antegrade flow in the main portal vein. IVC: No abnormality visualized. Pancreas: Visualized portion unremarkable. Spleen: Size and appearance within normal limits. Right Kidney: Length: 10.7 cm. Echogenicity within normal limits. 9 mm shadowing calculus over the upper pole. No mass or hydronephrosis visualized. Left Kidney: Length: 10 cm. Echogenicity within normal limits. No mass or hydronephrosis visualized. Abdominal aorta: No aneurysm or other abnormality visualized. The distal aorta is obscured by bowel gas. Other findings: Right basilar consolidation as seen on chest CT yesterday. IMPRESSION: 1. Normal appearance of the liver and biliary tree. 2. 9 mm right renal calculus. Electronically Signed   By: Monte Fantasia M.D.   On: 06/10/2016 13:51     Lovenia Kim, MD 06/10/2016, 3:53 PM PGY-1, [facility] Intern pager: [phone], text pages welcome

## 2016-06-10 NOTE — Progress Notes (Addendum)
Initial Nutrition Assessment  DOCUMENTATION CODES:   Not applicable  INTERVENTION:   Change TF regimen per Verbal order from Dr. Lindell Noe as follows:   Osmolite 1.2 at 100 ml/h x 10 hours at night (1000 ml per day)  Pro-stat 30 ml BID  Provides 1400 kcal, 86 gm protein, 820 ml free water daily  NUTRITION DIAGNOSIS:   Increased nutrient needs related to wound healing as evidenced by estimated needs.  GOAL:   Patient will meet greater than or equal to 90% of their needs  MONITOR:   TF tolerance, Skin, Labs, I & O's  REASON FOR ASSESSMENT:   Consult Assessment of nutrition requirement/status  ASSESSMENT:   46 y.o. female presenting with shortness of breath and LE swelling. 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  Spoke with patient who reports she receives Osmolite 1.2 formula via G-tube 1000 ml nocturnally from 11pm to 9 am. She also receives Promod liquid protein supplement 60 ml daily. She usually eats "for pleasure."  SLP following for dysphagia. Plans for MBS tomorrow.  Per RN, patient is currently only allowed liquids PO. Nutrition-Focused physical exam not indicated as muscle depletion is expected with SCI and quadriplegia.  Diet Order:  Diet clear liquid Room service appropriate? Yes; Fluid consistency: Thin  Skin:  Wound (see comment) (stage IV to sacrum & buttocks)  Last BM:  5/18  Height:   Ht Readings from Last 1 Encounters:  06/10/16 5\' 2"  (1.575 m)    Weight:   Wt Readings from Last 1 Encounters:  06/10/16 119 lb 0.8 oz (54 kg)    Ideal Body Weight:  50 kg  BMI:  Body mass index is 21.77 kg/m.  Estimated Nutritional Needs:   Kcal:  1400-1600  Protein:  80-90 gm  Fluid:  1.5 L  EDUCATION NEEDS:   No education needs identified at  this time  Molli Barrows, [place], [place], [place] Pager [phone] After Hours Pager [phone]

## 2016-06-10 NOTE — Progress Notes (Signed)
Visit made to patients room for trach check.  Patient just transferred from another floor.  I unpacked her bag and no extra trachs no obturator was found no ambu bag at bedside and no cont pulse ox at bedside .Marland Kitchen Advised Rn what patient needed she is ordering these things wasn't given this information when she got report.  I will check back and verify correct items are in patients room.

## 2016-06-10 NOTE — Consult Note (Addendum)
[facility] Nurse wound consult note Reason for Consult: Consult requested for sacrum and bilat ischium.  Pt has chronic wounds which are greatly improved, she states. Pt is followed by the outpatient wound care center at [facility] and has been using Hydroferra blue.  This topical treatment is not available in the [facility]; plan to substitute Aquacel to provide antimicrobial benefits and absorb drainage while she is in the hospital. Wound type: Right ischium with healed dry scar tissue from a previous wound which has healed, creating a deep valley without an open wound or drainage. 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 Scar tissue from a previous wound which has healed is surrounding, creating a deep valley. Sacrum with chronic stage 4 pressure injury, .5X3X1cm; bone palpable when probed with a swab, mod amt tan drainage, no odor Pressure Injury POA: Yes Dressing procedure/placement/frequency: Pt is on an air mattress to decrease pressure.  Aquacel to left ischium and sacrum as discussed above.  Foam dressings to protect wounds from further injury.  Discussed plan of care and pt verbalized understanding. Please re-consult if further assistance is needed.  Thank-you,  Julien Girt MSN, [place], [place], [place], [place]

## 2016-06-10 NOTE — Progress Notes (Signed)
Patient expressed hopelessness and anxiety regarding change to thickened liquid diet, stating "if I cannot drink sodas how I want, I do not want to live anymore". Stated she "needed to talk to her family about code plan".  Paged Family Medicine TS at 1025 regarding a possible palliative care consult and discussion about full code status. Received return phone call at 1026- states they are rounding and will address at this time.

## 2016-06-10 NOTE — Care Management Note (Addendum)
Case Management Note  Patient Details  Name: Angela Page MRN: [id number] Date of Birth: 12-31-1970  Subjective/Objective:  From home with parents, hx of spinal injury with quadriplegia s/p fall, recurrent uti's, diastolic heart failure n stage 3-4 decb ulcer, pacemaker, trach, tobacco abuse and gerd presents with resp failure to tp PNA / UTI, Sepsis on 3 liters continuous , LE edema, abd distention.  She was going to [facility] previously,  Has Medicare/Medicaid insurance.  NCM received call from Ruffin Pyo with [facility] stating patient is currently with them for home care.  PCP not listed  - Family  will need to call to make hospital follow up apt at [facility]                 Action/Plan: NCM will follow for dc needs.  Expected Discharge Date:                  Expected Discharge Plan:  [place]  In-House Referral:     Discharge planning Services  CM Consult  Post Acute Care Choice:    Choice offered to:     DME Arranged:    DME Agency:     HH Arranged:    [place] Agency:     Status of Service:  In process, will continue to follow  If discussed at Long Length of Stay Meetings, dates discussed:    Additional Comments:  Zenon Mayo, RN 06/10/2016, 3:40 PM

## 2016-06-11 ENCOUNTER — Encounter (HOSPITAL_COMMUNITY): Payer: Medicare Other

## 2016-06-11 ENCOUNTER — Inpatient Hospital Stay (HOSPITAL_COMMUNITY): Payer: Medicare Other

## 2016-06-11 DIAGNOSIS — J189 Pneumonia, unspecified organism: Secondary | ICD-10-CM

## 2016-06-11 DIAGNOSIS — N390 Urinary tract infection, site not specified: Secondary | ICD-10-CM

## 2016-06-11 DIAGNOSIS — A419 Sepsis, unspecified organism: Secondary | ICD-10-CM

## 2016-06-11 LAB — HEPATITIS B SURFACE ANTIBODY,QUALITATIVE: HEP B S AB: NONREACTIVE

## 2016-06-11 LAB — BASIC METABOLIC PANEL
Anion gap: 8 (ref 5–15)
BUN: 19 mg/dL (ref 6–20)
CHLORIDE: 93 mmol/L — AB (ref 101–111)
CO2: 35 mmol/L — ABNORMAL HIGH (ref 22–32)
CREATININE: 0.49 mg/dL (ref 0.44–1.00)
Calcium: 9.6 mg/dL (ref 8.9–10.3)
GFR calc Af Amer: >60 mL/min (ref >60)
GFR calc non Af Amer: >60 mL/min (ref >60)
Glucose, Bld: 209 mg/dL — ABNORMAL HIGH (ref 65–99)
POTASSIUM: 4.3 mmol/L (ref 3.5–5.1)
Sodium: 136 mmol/L (ref 135–145)

## 2016-06-11 LAB — CBC
HCT: 32.5 % — ABNORMAL LOW (ref 36.0–46.0)
HEMOGLOBIN: 10.2 g/dL — AB (ref 12.0–15.0)
MCH: 30.7 pg (ref 26.0–34.0)
MCHC: 31.4 g/dL (ref 30.0–36.0)
MCV: 97.9 fL (ref 78.0–100.0)
PLATELETS: 251 10*3/uL (ref 150–400)
RBC: 3.32 MIL/uL — AB (ref 3.87–5.11)
RDW: 13 % (ref 11.5–15.5)
WBC: 13 10*3/uL — ABNORMAL HIGH (ref 4.0–10.5)

## 2016-06-11 LAB — HEPATITIS PANEL, ACUTE
HCV Ab: <0.1 s/co ratio (ref 0.0–0.9)
Hep A IgM: NEGATIVE
Hep B C IgM: NEGATIVE
Hepatitis B Surface Ag: NEGATIVE

## 2016-06-11 LAB — HEPATITIS C ANTIBODY

## 2016-06-11 LAB — LEGIONELLA PNEUMOPHILA SEROGP 1 UR AG: L. pneumophila Serogp 1 Ur Ag: NEGATIVE

## 2016-06-11 LAB — HIV ANTIBODY (ROUTINE TESTING W REFLEX): HIV Screen 4th Generation wRfx: NONREACTIVE

## 2016-06-11 LAB — VANCOMYCIN, TROUGH: VANCOMYCIN TR: 24 ug/mL — AB (ref 15–20)

## 2016-06-11 MED ORDER — MIDODRINE HCL 5 MG PO TABS
10.0000 mg | ORAL_TABLET | Freq: Three times a day (TID) | ORAL | Status: DC
  Administered 2016-06-11 – 2016-06-13 (×8): 10 mg via ORAL
  Filled 2016-06-11 (×7): qty 2

## 2016-06-11 MED ORDER — FUROSEMIDE 10 MG/ML IJ SOLN
40.0000 mg | Freq: Once | INTRAMUSCULAR | Status: AC
  Administered 2016-06-11: 40 mg via INTRAVENOUS
  Filled 2016-06-11: qty 4

## 2016-06-11 MED ORDER — OXYCODONE HCL 5 MG PO TABS
10.0000 mg | ORAL_TABLET | ORAL | Status: DC | PRN
  Administered 2016-06-11 – 2016-06-13 (×10): 10 mg via ORAL
  Filled 2016-06-11 (×11): qty 2

## 2016-06-11 MED ORDER — ACETAMINOPHEN 500 MG PO TABS: 500.0000 mg | ORAL_TABLET | Freq: Four times a day (QID) | ORAL | Status: DC | PRN

## 2016-06-11 MED ORDER — SODIUM CHLORIDE 0.9 % IV SOLN
500.0000 mg | Freq: Two times a day (BID) | INTRAVENOUS | Status: DC
  Administered 2016-06-11 – 2016-06-12 (×2): 500 mg via INTRAVENOUS
  Filled 2016-06-11 (×3): qty 500

## 2016-06-11 MED ORDER — FAMOTIDINE 20 MG PO TABS
20.0000 mg | ORAL_TABLET | Freq: Two times a day (BID) | ORAL | Status: DC | PRN
  Administered 2016-06-13: 20 mg via ORAL
  Filled 2016-06-11: qty 1

## 2016-06-11 NOTE — Progress Notes (Signed)
Pharmacy Antibiotic Note Angela Page is a 46 y.o. female admitted on 06/09/2016 with lower extremity edema and SOB. She is on vancomycin and meropenem (day 3) for PNA. -WBC= 12, SCr= 0.49 and CrCl ~ 70 -Vancomycin trough= 24 at 2:20pm (last dose given at ~ 3:30am). Regimen was 750mg  IV q12h   Plan:  -Continue meropenem 2gm IV q12h -Change vancomycin to 500mg  IV q12h -Consider discontinuing vancomycin with MRSA PCR negative?   Temp (24hrs), Avg:99.6 F (37.6 C), Min:98.4 F (36.9 C), Max:100.3 F (37.9 C)   Recent Labs Lab 06/09/16 1344 06/09/16 1358 06/09/16 1827 06/10/16 0223 06/11/16 0453 06/11/16 1428  WBC 17.4*  --   --  11.2* 13.0*  --   CREATININE 0.48  --   --  0.45 0.49  --   LATICACIDVEN  --  1.00 1.10  --   --   --   VANCOTROUGH  --   --   --   --   --  24*    Estimated Creatinine Clearance: 70.2 mL/min (by C-G formula based on SCr of 0.49 mg/dL).    Allergies  Allergen Reactions  . Erythromycin Nausea And Vomiting  . Nitrofurantoin Monohyd Macro Nausea And Vomiting  . Penicillins Hives    Has patient had a PCN reaction causing immediate rash, facial/tongue/throat swelling, SOB or lightheadedness with hypotension: Yes Has patient had a PCN reaction causing severe rash involving mucus membranes or skin necrosis: Yes Has patient had a PCN reaction that required hospitalization Yes- went to the DR Has patient had a PCN reaction occurring within the last 10 years: No-childhood allergy If all of the above answers are "NO", then may proceed with Cephalosporin use.     Antimicrobials this admission: 5/21 vancomycin > 5/21 merrem >   Dose adjustments this admission: N/A   Microbiology results: 5/21 blood cx >> ngtd 5/21 MRSA PCR neg 5/21 urine >> mult species  Hildred Laser, Pharm D 06/11/2016 4:10 PM

## 2016-06-11 NOTE — Progress Notes (Signed)
Inpatient Diabetes Program Recommendations  AACE/ADA: New Consensus Statement on Inpatient Glycemic Control (2015)  Target Ranges:  Prepandial:   less than 140 mg/dL      Peak postprandial:   less than 180 mg/dL (1-2 hours)      Critically ill patients:  140 - 180 mg/dL   Lab Results  Component Value Date   GLUCAP 135 (H) 11/27/2015    Review of Glycemic Control  Diabetes history: None Outpatient Diabetes medications: None Current orders for Inpatient glycemic control: None  Pt on Osmolite TF. No hx DM. Blood sugar > 180 mg/dL.  Inpatient Diabetes Program Recommendations:    Add Novolog 0-9 units Q4H. Check HgbA1C to assess glycemic control prior to admission.  Will continue to follow.  Thank you. Lorenda Peck, RD, LDN, CDE Inpatient Diabetes Coordinator [phone]

## 2016-06-11 NOTE — Procedures (Signed)
Objective Swallowing Evaluation: Type of Study: FEES-Fiberoptic Endoscopic Evaluation of Swallow  Patient Details  Name: Angela Page MRN: [id number] Date of Birth: Dec 15, 1970  Today's Date: 06/11/2016 Time: SLP Start Time (ACUTE ONLY): 0926-SLP Stop Time (ACUTE ONLY): 1000 SLP Time Calculation (min) (ACUTE ONLY): 34 min  Past Medical History:  Past Medical History:  Diagnosis Date  . Asthma   . GERD (gastroesophageal reflux disease)   . Pacemaker   . Pleurisy   . Protein calorie malnutrition ([place]) 10/31/2015  . Quadriparesis ([place])   . Quadriplegia, C5-C7 incomplete ([place]) 10/08/2013  . Stage IV pressure ulcer of sacral region [facility]) 10/31/2015   Past Surgical History:  Past Surgical History:  Procedure Laterality Date  . APPENDECTOMY    . BACK SURGERY  10/08/2013   fusion of spine, cervical region  . CARDIAC SURGERY    . GASTROSTOMY  11/23/2015  . I&D EXTREMITY  10/28/2011   Procedure: IRRIGATION AND DEBRIDEMENT EXTREMITY;  Surgeon: Tennis Must, MD;  Location: [place];  Service: Orthopedics;  Laterality: Right;  Irrigation and debridement right middle finger  . IR GENERIC HISTORICAL  11/23/2015   IR GASTROSTOMY TUBE MOD SED 11/23/2015 [facility]-INTERV RAD  . TRACHEOSTOMY     HPI: Angela Page a 46 y.o. femalepresenting with shortness of breath and LE swelling. PMH is significant for traumatic spinal injury with quadraplegia s/p fall with C2-7 fusion, recurrent UTIs, diastolic heart failure, stage 3-4 decubitus ulcers, pacemaker for sick sinus syndrome, tobacco abuse, GERD, and tracheotomy s/p ICU admission for respiratory failure due to PNA/UTI sepsis, on 3L [place] - continuous, Asthma. SOB and SIRS, concern for aspiration. Pt has a history of mild oral and oropharyngeal dysphagia with MBS in 01/22/16 showing residuals, increased with thicker viscosity, Pt encouraged to clear throat/expectorate residue.   No Data Recorded   Assessment / Plan / Recommendation  CHL  IP CLINICAL IMPRESSIONS 06/11/2016  Clinical Impression Pt demonstrates sensory pharyngeal dysphagia with delayed swallow initiation to the pyriform sinuses. Laryngeal penetration suspected before and during the swallow evidenced by green tinted liquid remaining on false and true vocal cords with eventual aspiration (delayed and cough ineffective to fully expel penetrates/aspirates). A early breath hold maneuver was mildly effective however chin tuck strategy appeared to prevent larygneal compromise and less residue. Pt reported chin tuck "felt" more successful compared to breath hold. SLP educated that proper positioning is vital to mitigate her chronic aspiration risk (hx of esophageal deficits in combination with a deconditioned state and dependent feeder). Recommend Dys 3 texture (mom to bring paste for upper plate), thin liquids, straws allowed, small sips, chin tuck with liquids and volitional cough. ST will continue to follow.      SLP Visit Diagnosis Dysphagia, pharyngeal phase (R13.13)  Attention and concentration deficit following --  Frontal lobe and executive function deficit following --  Impact on safety and function Moderate aspiration risk      CHL IP TREATMENT RECOMMENDATION 06/11/2016  Treatment Recommendations Therapy as outlined in treatment plan below     Prognosis 06/11/2016  Prognosis for Safe Diet Advancement Good  Barriers to Reach Goals --  Barriers/Prognosis Comment --    CHL IP DIET RECOMMENDATION 06/11/2016  SLP Diet Recommendations Dysphagia 3 (Mech soft) solids;Thin liquid  Liquid Administration via Cup;Straw  Medication Administration (No Data)  Compensations Chin tuck;Slow rate;Small sips/bites;Hard cough after swallow  Postural Changes Remain semi-upright after after feeds/meals (Comment);Seated upright at 90 degrees      CHL IP OTHER RECOMMENDATIONS 06/11/2016  Recommended Consults --  Oral Care Recommendations Oral care BID  Other Recommendations --       CHL IP FOLLOW UP RECOMMENDATIONS 06/11/2016  Follow up Recommendations 24 hour supervision/assistance      CHL IP FREQUENCY AND DURATION 06/11/2016  Speech Therapy Frequency (ACUTE ONLY) min 2x/week  Treatment Duration 2 weeks           CHL IP ORAL PHASE 06/11/2016  Oral Phase WFL  Oral - Pudding Teaspoon --  Oral - Pudding Cup --  Oral - Honey Teaspoon --  Oral - Honey Cup --  Oral - Nectar Teaspoon --  Oral - Nectar Cup --  Oral - Nectar Straw --  Oral - Thin Teaspoon --  Oral - Thin Cup --  Oral - Thin Straw --  Oral - Puree --  Oral - Mech Soft --  Oral - Regular --  Oral - Multi-Consistency --  Oral - Pill --  Oral Phase - Comment --    CHL IP PHARYNGEAL PHASE 06/11/2016  Pharyngeal Phase Impaired  Pharyngeal- Pudding Teaspoon --  Pharyngeal --  Pharyngeal- Pudding Cup --  Pharyngeal --  Pharyngeal- Honey Teaspoon --  Pharyngeal --  Pharyngeal- Honey Cup --  Pharyngeal --  Pharyngeal- Nectar Teaspoon --  Pharyngeal --  Pharyngeal- Nectar Cup --  Pharyngeal --  Pharyngeal- Nectar Straw --  Pharyngeal --  Pharyngeal- Thin Teaspoon --  Pharyngeal --  Pharyngeal- Thin Cup Penetration/Aspiration before swallow;Penetration/Aspiration during swallow;Delayed swallow initiation-pyriform sinuses;Pharyngeal residue - valleculae  Pharyngeal Material enters airway, passes BELOW cords without attempt by patient to eject out (silent aspiration);Material enters airway, passes BELOW cords and not ejected out despite cough attempt by patient;Material enters airway, CONTACTS cords and not ejected out  Pharyngeal- Thin Straw Pharyngeal residue - valleculae;Delayed swallow initiation-pyriform sinuses;Penetration/Aspiration before swallow;Penetration/Aspiration during swallow  Pharyngeal Material enters airway, passes BELOW cords and not ejected out despite cough attempt by patient;Material enters airway, passes BELOW cords without attempt by patient to eject out (silent aspiration)   Pharyngeal- Puree --  Pharyngeal --  Pharyngeal- Mechanical Soft --  Pharyngeal --  Pharyngeal- Regular --  Pharyngeal --  Pharyngeal- Multi-consistency --  Pharyngeal --  Pharyngeal- Pill --  Pharyngeal --  Pharyngeal Comment --     CHL IP CERVICAL ESOPHAGEAL PHASE 06/11/2016  Cervical Esophageal Phase Impaired  Pudding Teaspoon --  Pudding Cup --  Honey Teaspoon --  Honey Cup --  Nectar Teaspoon --  Nectar Cup --  Nectar Straw --  Thin Teaspoon --  Thin Cup --  Thin Straw --  Puree --  Mechanical Soft --  Regular --  Multi-consistency --  Pill --  Cervical Esophageal Comment --    No flowsheet data found.  Houston Siren 06/11/2016, 1:48 PM  Orbie Pyo Colvin Caroli.[employer] [phone]

## 2016-06-11 NOTE — Progress Notes (Signed)
**  Preliminary report by tech**  Bilateral lower extremity venous duplex completed. There is no obvious evidence of deep or superficial vein thrombosis involving the right and left lower extremities. All clearly visualized vessels appear patent and compressible. There is no evidence of Baker's cysts bilaterally.  06/11/16 4:48 PM Carlos Levering RVT

## 2016-06-11 NOTE — Progress Notes (Signed)
Family Medicine Teaching Service Daily Progress Note Intern Pager: [phone]  Patient name: Angela Page Medical record number: [id number] Date of birth: Jun 11, 1970 Age: 46 y.o. Gender: female  Primary Care Provider: System, Provider Not In Consultants: None  Code Status: FULL   Pt Overview and Major Events to Date:  Admit 5/21  Vanc/Meropenem (5/22-)   Assessment and Plan: Angela Page is a 46 y.o. female presenting with shortness of breath and LE swelling. PMH is significant for traumatic spinal injury with quadraplegia s/p fall with C2-7 fusion, recurrent UTIs, diastolic heart failure, stage 3-4 decubitus ulcers, pacemaker for sick sinus syndrome, tobacco abuse, GERD, and tracheotomy s/p ICU admission for respiratory failure due to PNA/UTI sepsis, on 3L [place] - continuous, asthma   SOB likely 2/2 Pneumonia.  CTA with no evidence of PE, but complete consolidation of RLL and dense airspace opacification involving portions of the right upper and middle lobes.   - Blood NG <24h  - Vancomycin and meropenem- Day 3 (PCN allergy)  - Strep pneumonia negative; Legionella antigen pending  - Obtain trachea aspirate - culture and gram stain - hx of acintobacter positive in the past  - continue pulse oximetry   Lower extremity edema/?CHF exacerbation: Worsening edema over the past 1 month. Currently on 20 mg  PO Lasix at home. Hgb 12.0/ Albumin 3.2. Feel this is likely part of CHF exacerbation, thought patient denies any improvement with recently  increased lasix dose.  Patient with lower blood pressures but taking Midodrine which will help somewhat. ACS ruled out with negative troponins x3.  ECHO with 55-60%, no other abnormalities.   - Dopplers bilateral LE pending - Swelling worse this AM with overlying skin appearing tight and shiny.  2+ pitting edema noted to thighs bilaterally and patient uncomfortable.  -Given IV lasix 20 mg x 1 5/22.  Will dose another IV 40 mg x1 and reassess fluid  status this afternoon for  additional Lasix if needed.   - Daily weights (119 on admission>127 today).  With UOP 2.2L over past 24 hours.  - Intake and output   Abdominal Distention: Likely secondary to fluid overload.   - If continues to worse could consider abdominal ultrasound   Indwelling catheter.  Last Foley change was 5/1.  -Will replace today to minimize sources of infection.   Slight transaminates: Viral vs. CHF exacerbation. AST 75 ALT 77. Previously noted elevations in the past year. No previous hepatitis panel found.  PT/INR normal. Hepatitis panel normal.   - Could consider abdominal ultrasound in future if no improvement of distention   Traumatic spinal injury with quadriplegia and associated chronic pain: Cervical Spine injury in 2015. 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 - Continue oxycodone IR 10 mg every 4 hour - Baclofen 20 mg 3 times a day.  Can consider increasing dose of Baclofen if needed.  - Gabapentin 400 mg 4 times a day - Zanaflex 4 mg 3 times a day  GERD - Continue home Zantac   Tracheotomy At home on 3L continuous oxygen.   - Night time trachea collar with 3 Liters by trachea while sleeping   Hypotension: On admission hypotensive however per patient and home aide, she usually runs low ~85/50.  - Continue home midodrine   Stage III-IV sacral decubitus ulcers.  Patient is chronically bed-ridden 2/2 quadriplegia.  No signs of infection  -  Wound care consult    Esophageal Dysphagia  - Tube feeds over 10 hours  - Consults to nutrition  -  s/p FEES procedure this AM, she is determined to be at moderate risk of aspiration.   Tobacco Abuse.  Notes she is a current everyday smoker. Smokes about 1 PPD x 20 years.   - 21 mg transdermal Nicotine patch daily   Asthma vs COPD. Denies any hx of COPD. Asthma noted in chart review. Patient denies any  - Continue home Spiriva  - Add albuterol as needed   FEN/GI: Dysphagia 3 diet, Osmolite  per tube x 10h/day  Prophylaxis: Lovenox  Disposition: Continue to monitor on inpatient   Subjective:  Patient complaining of bilateral leg swelling this morning.  Legs feel tight and uncomfortable.  Stomach feels distended.  No other concerns at this time.  Objective: Temp:  [98.3 F (36.8 C)-100.5 F (38.1 C)] 99.3 F (37.4 C) (05/23 1450) Pulse Rate:  [80-85] 80 (05/23 1450) Resp:  [16-20] 20 (05/23 1450) BP: (91-106)/(49-69) 98/55 (05/23 1450) SpO2:  [95 %-100 %] 100 % (05/23 1450) Weight:  [127 lb 13.9 oz (58 kg)] 127 lb 13.9 oz (58 kg) (05/23 8016) Physical Exam: General: pleasant 46 yo F lying in bed, NAD  Cardiovascular: RRR, 3/6 holosystolic murmur  Respiratory: CTAB, no increased work of breathing  Gastrointestinal: distended, nontender, +bs MSK: 2+  Pitting edema bilaterally to upper thighs  Derm: warm, dry  Neuro: AOx4, no sensation in LE bilaterally, does not move LE, 1/5 grip strength b/l  Psych: normal affect  Laboratory:  Recent Labs Lab 06/09/16 1344 06/10/16 0223 06/11/16 0453  WBC 17.4* 11.2* 13.0*  HGB 12.0 11.1* 10.2*  HCT 37.1 34.7* 32.5*  PLT 249 206 251    Recent Labs Lab 06/09/16 1344 06/10/16 0223 06/11/16 0453  NA 134* 139 136  K 4.0 4.0 4.3  CL 93* 95* 93*  CO2 30 36* 35*  BUN 23* 16 19  CREATININE 0.48 0.45 0.49  CALCIUM 9.4 9.6 9.6  PROT 7.3 6.4*  --   BILITOT 0.4 0.3  --   ALKPHOS 187* 174*  --   ALT 77* 72*  --   AST 75* 61*  --   GLUCOSE 138* 168* 209*    Imaging/Diagnostic Tests: No results found.  Lovenia Kim, MD 06/11/2016, 3:01 PM PGY-1, [place] Intern pager: [phone], text pages welcome

## 2016-06-12 ENCOUNTER — Encounter (HOSPITAL_COMMUNITY): Payer: Self-pay | Admitting: General Practice

## 2016-06-12 LAB — BASIC METABOLIC PANEL
Anion gap: 9 (ref 5–15)
BUN: 20 mg/dL (ref 6–20)
CALCIUM: 9.4 mg/dL (ref 8.9–10.3)
CO2: 40 mmol/L — ABNORMAL HIGH (ref 22–32)
CREATININE: 0.48 mg/dL (ref 0.44–1.00)
Chloride: 91 mmol/L — ABNORMAL LOW (ref 101–111)
GFR calc Af Amer: >60 mL/min (ref >60)
GLUCOSE: 205 mg/dL — AB (ref 65–99)
Potassium: 3.6 mmol/L (ref 3.5–5.1)
SODIUM: 140 mmol/L (ref 135–145)

## 2016-06-12 LAB — CBC
HCT: 33.6 % — ABNORMAL LOW (ref 36.0–46.0)
Hemoglobin: 10.5 g/dL — ABNORMAL LOW (ref 12.0–15.0)
MCH: 30.9 pg (ref 26.0–34.0)
MCHC: 31.3 g/dL (ref 30.0–36.0)
MCV: 98.8 fL (ref 78.0–100.0)
PLATELETS: 263 10*3/uL (ref 150–400)
RBC: 3.4 MIL/uL — AB (ref 3.87–5.11)
RDW: 12.8 % (ref 11.5–15.5)
WBC: 9.6 10*3/uL (ref 4.0–10.5)

## 2016-06-12 MED ORDER — CLINDAMYCIN HCL 300 MG PO CAPS
300.0000 mg | ORAL_CAPSULE | Freq: Three times a day (TID) | ORAL | Status: DC
  Administered 2016-06-12 – 2016-06-13 (×4): 300 mg via ORAL
  Filled 2016-06-12 (×4): qty 1

## 2016-06-12 MED ORDER — LEVOFLOXACIN 500 MG PO TABS
500.0000 mg | ORAL_TABLET | Freq: Every day | ORAL | Status: DC
  Administered 2016-06-12: 500 mg via ORAL
  Filled 2016-06-12: qty 1

## 2016-06-12 MED ORDER — LEVOFLOXACIN 500 MG PO TABS: 500.0000 mg | ORAL_TABLET | Freq: Every day | ORAL | Status: DC

## 2016-06-12 MED ORDER — LEVOFLOXACIN 750 MG PO TABS: 750.0000 mg | ORAL_TABLET | Freq: Every day | ORAL | Status: DC

## 2016-06-12 MED ORDER — FUROSEMIDE 10 MG/ML IJ SOLN
40.0000 mg | Freq: Once | INTRAMUSCULAR | Status: AC
  Administered 2016-06-12: 40 mg via INTRAVENOUS
  Filled 2016-06-12: qty 4

## 2016-06-12 NOTE — Care Management Note (Signed)
Case Management Note  Patient Details  Name: Jeanene Mena MRN: [id number] Date of Birth: 02-Dec-1970  Subjective/Objective:   Hx spinal injury with quadriplegia s/p fall, recurrent uti's, diastolic heart failure, stage 3-4 decub ulcer, pacemaker, trach, tobacco abuse, and gerd presents with resp failure d/t PNA, UTI, Sepsis, on 3 L continuous, goes to [facility] previously                 Action/Plan: Discharge Planning: Lives at home with parents. Pt is active with [facility] for 12-16 hours of RN services, please contact 24 hours prior to dc, Sharmane Dame # [phone], Alvis Lemmings Liaison so that can have her support in place.     Expected Discharge Date:                 Expected Discharge Plan:  [place]  In-House Referral:  NA  Discharge planning Services  CM Consult  Post Acute Care Choice:  Home Health, Resumption of Svcs/PTA Provider Choice offered to:  Parent  DME Arranged:  N/A DME Agency:  NA  HH Arranged:  RN, Nurse's Aide HH Agency:  [place]  Status of Service:  In process, will continue to follow  If discussed at Long Length of Stay Meetings, dates discussed:    Additional Comments:  Erenest Rasher, RN 06/12/2016, 8:53 AM

## 2016-06-12 NOTE — Progress Notes (Signed)
Nutrition Follow-up  DOCUMENTATION CODES:   Not applicable  INTERVENTION:   Continue:   Osmolite 1.2 at 100 ml/h x 10 hours at night (1000 ml per day)  Pro-stat 30 ml BID  Provides 1400 kcal, 86 gm protein, 820 ml free water daily  NUTRITION DIAGNOSIS:   Increased nutrient needs related to wound healing as evidenced by estimated needs.  Ongoing  GOAL:   Patient will meet greater than or equal to 90% of their needs  Met with TF  MONITOR:   TF tolerance, Skin, Labs, I & O's  REASON FOR ASSESSMENT:   Consult Assessment of nutrition requirement/status  ASSESSMENT:   46 y.o. female presenting with shortness of breath and LE swelling. 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  Pt underwent FEES on 06/11/16, which revealed sensory pharyngeal dysphagia with delayed swallow. Pt has been upgraded to a dysphagia 3 diet with thin liquids.   Spoke with pt, who reports feeling much better. She reports she ate very little at breakfast, however, this is typical for her. She mainly eats for pleasure. She reports she is swallowing liquids much better due to chin tuck strategy suggested by SLP.   Pt concerned about TF; due to her eating very little, she wants to Ensure she is getting the majority of her nutrition via TF. Reviewed calorie needs and TF regimen with pt, assuring her that ordered TF regimen met 100% of needs.   Pt with no further questions, but expressed appreciation for visit.   Labs reviewed.   Diet Order:  DIET DYS 3 Room service appropriate? Yes; Fluid consistency: Thin  Skin:  Wound (see comment) (stage IV to sacrum & buttocks)  Last BM:  06/12/16  Height:   Ht Readings from Last 1 Encounters:  06/10/16 _0  (1.575 m)    Weight:   Wt Readings from Last 1  Encounters:  06/12/16 116 lb 13.5 oz (53 kg)    Ideal Body Weight:  50 kg  BMI:  Body mass index is 21.37 kg/m.  Estimated Nutritional Needs:   Kcal:  1400-1600  Protein:  80-90 gm  Fluid:  1.5 L  EDUCATION NEEDS:   No education needs identified at this time  Ermie Glendenning A. Jimmye Norman, RD, LDN, CDE Pager: [phone] After hours Pager: [phone]

## 2016-06-12 NOTE — Progress Notes (Signed)
Inpatient Diabetes Program Recommendations  AACE/ADA: New Consensus Statement on Inpatient Glycemic Control (2015)  Target Ranges:  Prepandial:   less than 140 mg/dL      Peak postprandial:   less than 180 mg/dL (1-2 hours)      Critically ill patients:  140 - 180 mg/dL   Lab Results  Component Value Date   GLUCAP 135 (H) 11/27/2015    Review of Glycemic Control  Lab blood glucose > 200 mg/dL x 2 days. Needs Novolog correction insulin.  Inpatient Diabetes Program Recommendations:    Add Novolog 0-9 units Q6H Please check HgbA1C to assess glycemic control prior to admission.  Continue to follow. Thank you. Lorenda Peck, RD, LDN, CDE Inpatient Diabetes Coordinator [phone]

## 2016-06-12 NOTE — Progress Notes (Signed)
  Speech Language Pathology Treatment: Dysphagia  Patient Details Name: Angela Page MRN: [id number] DOB: 11-Dec-1970 Today's Date: 06/12/2016 Time: 9355-2174 SLP Time Calculation (min) (ACUTE ONLY): 24 min  Assessment / Plan / Recommendation Clinical Impression  Assisted pt with reiteration and implementation of swallow strategies needing mild-moderate cues to perform chin tuck and volitional cough. Several episodes of unintentional/refexive coughing due to likely penetration/aspiration. Pt's ability to produce strong to moderately strong cough having uadriplegia is a benefit to mitigate aspiration risk although she is a silent aspiratory to small amounts. Intake is little as she states she does not eat much and last night's tube feeding just ended. Educated/discussed chronic dysphagia and if she experiences future repeated pna's diet modification may need to be revisited.    HPI        SLP Plan  Continue with current plan of care       Recommendations  Diet recommendations: Regular;Thin liquid Liquids provided via: Cup;Straw Medication Administration: Whole meds with puree Supervision: Staff to assist with self feeding;Intermittent supervision to cue for compensatory strategies Compensations: Slow rate;Small sips/bites;Chin tuck;Clear throat after each swallow Postural Changes and/or Swallow Maneuvers: Seated upright 90 degrees;Upright 30-60 min after meal                Oral Care Recommendations: Oral care BID Follow up Recommendations: 24 hour supervision/assistance SLP Visit Diagnosis: Dysphagia, pharyngeal phase (R13.13) Plan: Continue with current plan of care       GO                Houston Siren 06/12/2016, 10:06 AM  Orbie Pyo Colvin Caroli.[employer] [phone]

## 2016-06-12 NOTE — Progress Notes (Signed)
Family Medicine Teaching Service Daily Progress Note Intern Pager: [phone]  Patient name: Angela Page Medical record number: [id number] Date of birth: 08/30/1970 Age: 46 y.o. Gender: female  Primary Care Provider: System, Provider Not In Consultants: None  Code Status: FULL   Pt Overview and Major Events to Date:  Admit 5/21  Vanc/Meropenem (5/22-24) Clinda/Levaquin (5/24-)   Assessment and Plan: Duru Reiger is a 46 y.o. female presenting with shortness of breath and LE swelling. PMH is significant for traumatic spinal injury with quadraplegia s/p fall with C2-7 fusion, recurrent UTIs, diastolic heart failure, stage 3-4 decubitus ulcers, pacemaker for sick sinus syndrome, tobacco abuse, GERD, and tracheotomy s/p ICU admission for respiratory failure due to PNA/UTI sepsis, on 3L [place] - continuous, asthma.   SOB likely 2/2 Aspiration Pneumonia.  Improved. CTA with no evidence of PE, but complete consolidation of RLL and dense airspace opacification involving portions of the right upper and middle lobes likely 2/2 aspiration.  Strep pneumonia negative; Legionella antigen negative.    - Blood NG2D and patient has remained afebrile overnight.  - Vancomycin and Meropenem- Day 4 (PCN allergy).  Will D/C V/M and transition to po Levaquin and Clindamycin for 3 days for total of 7 days on antibiotics.     - trachea aspirate Cx and gram stain pending  - continue pulse oximetry   Lower extremity edema/?CHF exacerbation:  Worsening edema over the past 1 month. With no improvement on recently increased Lasix dose to 20 mg po daily. Hgb 12.0. Albumin 3.2.  Patient with lower blood pressures but taking Midodrine which will help somewhat.  ACS ruled out with negative troponins x3.  ECHO with 55-60%, no other abnormalities.  - LE dopplers negative.   - Compression stockings applied overnight  - Swelling stable this AM and is 2+ pitting edema to bilateral thighs.   - Give IV Lasix 40 mg x1 this AM  and will reassess fluid status later in the afternoon  - Daily weights (119 on admission>127>116 today). Yesterday's weight likely not accurate.  Foley replaced 5/23 and good UOP of 2.2L over past 24 hours.   - Strict I's and O's   Abdominal Distention: Likely secondary to fluid overload.   - If continues to worse could consider abdominal ultrasound   Indwelling catheter.  Last Foley change was 5/1.  -Replaced 5/23 to minimize sources of infection.   Slight transaminates: Viral vs. CHF exacerbation. AST 75 ALT 77. Previously noted elevations in the past year. No previous hepatitis panel found.  PT/INR normal. Hepatitis panel normal.   - Could consider abdominal ultrasound in future if no improvement of distention   Traumatic spinal injury with quadriplegia and associated chronic pain: Cervical Spine injury in 2015. 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 - Continue oxycodone IR 10 mg every 4 hour - Baclofen 20 mg 3 times a day.  Can consider increasing dose of Baclofen if needed.  - Gabapentin 400 mg 4 times a day - Zanaflex 4 mg 3 times a day  GERD - Continue home Zantac   Tracheotomy At home on 3L continuous oxygen.   - Night time trachea collar with 3 Liters by trachea while sleeping   Hypotension: On admission hypotensive however per patient and home aide, she usually runs low ~85/50.  - Continue home midodrine   Stage III-IV sacral decubitus ulcers.  Patient is chronically bed-ridden 2/2 quadriplegia.  No signs of infection  -  Wound care consult    Esophageal Dysphagia  -  Tube feeds over 10 hours  - Consults to nutrition  -s/p FEES procedure this AM, she is determined to be at moderate risk of aspiration.   Tobacco Abuse.  Notes she is a current everyday smoker. Smokes about 1 PPD x 20 years.   - 21 mg transdermal Nicotine patch daily   Asthma vs COPD. Denies any hx of COPD. Asthma noted in chart review. Patient denies any  - Continue home Spiriva   - Add albuterol as needed   FEN/GI: Dysphagia 3 diet, Osmolite per tube x 10h/day  Prophylaxis: Lovenox  Disposition: Continue to monitor on inpatient   Subjective:  Patient with no acute events overnight.  Feels better with compression stockings on.  Notes her stomach still feels a little tight.   No other concerns at this time.   Objective: Temp:  [97.6 F (36.4 C)-99.7 F (37.6 C)] 97.6 F (36.4 C) (05/24 0609) Pulse Rate:  [80-84] 80 (05/24 1122) Resp:  [16-20] 17 (05/24 1122) BP: (84-102)/(55-62) 84/55 (05/24 0609) SpO2:  [95 %-100 %] 96 % (05/24 1120) Weight:  [116 lb 13.5 oz (53 kg)] 116 lb 13.5 oz (53 kg) (05/24 0246) Physical Exam: General: pleasant 46 yo F lying in bed, NAD  Cardiovascular: RRR, 3/6 holosystolic murmur Respiratory: CTAB, work of breathing is comfortable on [place]  Gastrointestinal: distended, nontender, +bs  MSK: 2+ pitting edema bilaterally to upper thighs  Derm: warm, dry  Neuro: AOx4, no sensation in LE bilaterally, does not move LE, 1/5 grip strength b/l  Psych: normal affect   Laboratory:  Recent Labs Lab 06/10/16 0223 06/11/16 0453 06/12/16 0618  WBC 11.2* 13.0* 9.6  HGB 11.1* 10.2* 10.5*  HCT 34.7* 32.5* 33.6*  PLT 206 251 263    Recent Labs Lab 06/09/16 1344 06/10/16 0223 06/11/16 0453 06/12/16 0618  NA 134* 139 136 140  K 4.0 4.0 4.3 3.6  CL 93* 95* 93* 91*  CO2 30 36* 35* 40*  BUN 23* 16 19 20   CREATININE 0.48 0.45 0.49 0.48  CALCIUM 9.4 9.6 9.6 9.4  PROT 7.3 6.4*  --   --   BILITOT 0.4 0.3  --   --   ALKPHOS 187* 174*  --   --   ALT 77* 72*  --   --   AST 75* 61*  --   --   GLUCOSE 138* 168* 209* 205*    Imaging/Diagnostic Tests: No results found.  Lovenia Kim, MD 06/12/2016, 12:35 PM PGY-1, [place] Intern pager: [phone], text pages welcome

## 2016-06-12 NOTE — Progress Notes (Signed)
Visit made to patients room for trach check.  Patient states she feels like she may need to be suctioned.  Lavaged and suctioned patient twice and retrieved a large tan green mucus plug from trach.  Patient stated she felt much better now.  RT will continue to monitor.

## 2016-06-13 LAB — BASIC METABOLIC PANEL
ANION GAP: 9 (ref 5–15)
BUN: 20 mg/dL (ref 6–20)
CALCIUM: 9.4 mg/dL (ref 8.9–10.3)
CO2: 35 mmol/L — ABNORMAL HIGH (ref 22–32)
Chloride: 94 mmol/L — ABNORMAL LOW (ref 101–111)
Creatinine, Ser: 0.42 mg/dL — ABNORMAL LOW (ref 0.44–1.00)
GLUCOSE: 156 mg/dL — AB (ref 65–99)
Potassium: 3.7 mmol/L (ref 3.5–5.1)
Sodium: 138 mmol/L (ref 135–145)

## 2016-06-13 LAB — CBC
HCT: 32.8 % — ABNORMAL LOW (ref 36.0–46.0)
Hemoglobin: 10.5 g/dL — ABNORMAL LOW (ref 12.0–15.0)
MCH: 31.7 pg (ref 26.0–34.0)
MCHC: 32 g/dL (ref 30.0–36.0)
MCV: 99.1 fL (ref 78.0–100.0)
Platelets: 248 10*3/uL (ref 150–400)
RBC: 3.31 MIL/uL — ABNORMAL LOW (ref 3.87–5.11)
RDW: 12.9 % (ref 11.5–15.5)
WBC: 8.2 10*3/uL (ref 4.0–10.5)

## 2016-06-13 MED ORDER — LEVOFLOXACIN 500 MG PO TABS: 500.0000 mg | ORAL_TABLET | Freq: Every day | ORAL | 0 refills | Status: AC

## 2016-06-13 MED ORDER — SODIUM CHLORIDE 0.9 % IV BOLUS (SEPSIS)
500.0000 mL | Freq: Once | INTRAVENOUS | Status: AC
  Administered 2016-06-13: 500 mL via INTRAVENOUS

## 2016-06-13 MED ORDER — CLINDAMYCIN HCL 300 MG PO CAPS: 300.0000 mg | ORAL_CAPSULE | Freq: Three times a day (TID) | ORAL | 0 refills | Status: AC

## 2016-06-13 MED ORDER — FUROSEMIDE 20 MG PO TABS: 40.0000 mg | ORAL_TABLET | Freq: Every day | ORAL | 2 refills | Status: DC

## 2016-06-13 NOTE — Progress Notes (Signed)
Family Medicine Teaching Service Daily Progress Note Intern Pager: [phone]  Patient name: Angela Page Medical record number: [id number] Date of birth: 1970-10-03 Age: 46 y.o. Gender: female  Primary Care Provider: System, Provider Not In Consultants: None  Code Status: FULL   Pt Overview and Major Events to Date:  Admit 5/21  Vanc/Meropenem (5/22-24) Clinda/Levaquin (5/24-)   Assessment and Plan: Angela Page is a 46 y.o. female presenting with shortness of breath and LE swelling. 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   SOB likely 2/2 Aspiration Pneumonia.  Improved. CTA with no evidence of PE, but complete consolidation of RLL and dense airspace opacification involving portions of the right upper and middle lobes likely 2/2 aspiration.  Strep pneumonia negative; Legionella antigen negative.    - Blood NG2D and patient has remained afebrile overnight.  - Day 2 po Levaquin and Clindamycin for 3 days for total of 7 days on antibiotics.    -Discharge home on po antibiotics.   - continue pulse oximetry   Lower extremity edema/?CHF exacerbation:  Improving.    Patient with lower blood pressures but taking Midodrine which will help somewhat. - Compression stockings - Will discharge home on po Lasix 40 mg daily and follow up outpatient.   Abdominal Distention: Likely secondary to fluid overload.   - If continues to worse could consider abdominal ultrasound   Indwelling catheter.  Last Foley change was 5/1.  -Replaced 5/23 to minimize sources of infection.   Slight transaminates: Viral vs. CHF exacerbation. AST 75 ALT 77. Previously noted elevations in the past year. No previous hepatitis  panel found.  PT/INR normal. Hepatitis panel normal.   - Could consider abdominal ultrasound in future if no improvement of distention    Traumatic spinal injury with quadriplegia and associated chronic pain: Cervical Spine injury in 2015. 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 - Continue oxycodone IR 10 mg every 4 hour - Baclofen 20 mg 3 times a day.  Can consider increasing dose of Baclofen if needed.  - Gabapentin 400 mg 4 times a day - Zanaflex 4 mg 3 times a day  GERD - Continue home Zantac   Tracheotomy At home on 3L continuous oxygen.   - Night time trachea collar with 3 Liters by trachea while sleeping   Hypotension: On admission hypotensive however per patient and home aide, she usually runs low ~85/50.  - Continue home midodrine   Stage III-IV sacral decubitus ulcers.  Patient is chronically bed-ridden 2/2 quadriplegia.  No signs of infection  -  Wound care consult    Esophageal Dysphagia  - Tube feeds over 10 hours  - Consults to nutrition  -s/p FEES procedure this AM, she is determined to be at moderate risk of aspiration.   Tobacco Abuse.  Notes she is a current everyday smoker. Smokes about 1 PPD x 20 years.   - 21 mg transdermal Nicotine patch daily   Asthma vs COPD. Denies any hx of COPD. Asthma noted in chart review. Patient denies any  - Continue home Spiriva  - Add albuterol as needed   FEN/GI: Dysphagia 3 diet, Osmolite per tube x 10h/day  Prophylaxis: Lovenox  Disposition: Discharge home this morning.   Subjective:  Patient with no acute events  overnight.  She feels much better and feels like she is ready for discharge.    Objective: Temp:  [97.7 F (36.5 C)-98.4 F (36.9 C)] 97.7 F (36.5 C) (05/25 0459) Pulse Rate:  [79-81] 80 (05/25 1137) Resp:  [16-20] 16 (05/25 1137) BP: (74-148)/(49-84) 101/68 (05/25 0751) SpO2:  [95 %-100 %] 98 % (05/25 1137) Weight:  [119 lb 0.8 oz (54 kg)] 119 lb 0.8 oz (54 kg) (05/25 0157) Physical  Exam: General: pleasant 46 yo F lying in bed, NAD  Cardiovascular: RRR, 3/6 holosystolic murmur noted  Respiratory: CTAB, comfortable WOB on 3L  Gastrointestinal: distended, nontender, +BS MSK: 1+ pitting edema to mid calf  Derm: warm, dry  Neuro: AOx4, no sensation in LE bilaterally, does not move LE, 1/5 grip strength b/l  Psych: normal affect   Laboratory:  Recent Labs Lab 06/11/16 0453 06/12/16 0618 06/13/16 0613  WBC 13.0* 9.6 8.2  HGB 10.2* 10.5* 10.5*  HCT 32.5* 33.6* 32.8*  PLT 251 263 248    Recent Labs Lab 06/09/16 1344 06/10/16 0223 06/11/16 0453 06/12/16 0618 06/13/16 0613  NA 134* 139 136 140 138  K 4.0 4.0 4.3 3.6 3.7  CL 93* 95* 93* 91* 94*  CO2 30 36* 35* 40* 35*  BUN 23* 16 19 20 20   CREATININE 0.48 0.45 0.49 0.48 0.42*  CALCIUM 9.4 9.6 9.6 9.4 9.4  PROT 7.3 6.4*  --   --   --   BILITOT 0.4 0.3  --   --   --   ALKPHOS 187* 174*  --   --   --   ALT 77* 72*  --   --   --   AST 75* 61*  --   --   --   GLUCOSE 138* 168* 209* 205* 156*   Imaging/Diagnostic Tests: No results found.  Lovenia Kim, MD 06/13/2016, 12:16 PM PGY-1, [place] Intern pager: [phone], text pages welcome

## 2016-06-13 NOTE — Progress Notes (Signed)
  Speech Language Pathology Treatment:    Patient Details Name: Angela Page MRN: [id number] DOB: 16-May-1970 Today's Date: 06/13/2016 Time:  -     Assessment / Plan / Recommendation Clinical Impression  Patient seen for follow-up for training in compensatory techniques as well as to determine appropriateness for respiratory muscle training to improve swallow function. SLP provided education re: use of chin tuck, maximizing upright posture during PO intake to reduce risks for aspiration. MBS showed cued cough was insufficient for airway clearance; assessed pt's maximum inspiratory and expiratory pressures to determine if she is a candidate for RMT to improve cough effectiveness. Patient's Maximum Inspiratory Pressure (MIP) of 36 is within .5 of lower limit of normal, however Maximum Expiratory Pressure (MEP) at 23, well below Lower Limit of Normal (64.5). Discussed follow-up plans with pt and determined higher level EMST device is likely to be more appropriate although lowest resistance is slightly above pt's training target of 75% of MEP at 17.5. Provided training in use of device (given for home exercise), rationale for use and recommendation for f/u with SLP in [facility]. Education complete, pt discharging today.    HPI HPI: Angela Page a 46 y.o. femalepresenting with shortness of breath and LE swelling. 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 SOB and SIRS, concern for aspiration. Pt has a history of mild oral and oropharyngeal dysphagia with MBS in 01/22/16 showing residuals, increased with thicker viscosity, Pt encouraged to clear throat/expectorate residue.       SLP Plan  Discharge SLP treatment due to  (comment) (pt discharge pending)       Recommendations  Liquids provided via: Cup;Straw Medication Administration: Whole meds with puree Supervision: Staff to assist with self feeding;Intermittent supervision to cue for compensatory strategies Compensations: Slow rate;Small sips/bites;Chin tuck;Clear throat after each swallow Postural Changes and/or Swallow Maneuvers: Seated upright 90 degrees;Upright 30-60 min after meal                Oral Care Recommendations: Oral care BID Follow up Recommendations: 24 hour supervision/assistance;Home health SLP SLP Visit Diagnosis: Dysphagia, pharyngeal phase (R13.13) Plan: Discharge SLP treatment due to (comment) (pt discharge pending)       [place], [place], [place] Pathologist [place] 06/13/2016, 4:35 PM

## 2016-06-13 NOTE — Progress Notes (Signed)
Patient discharged home via stretcher with 2 transporters. Patient's mother called as well as home health to inform them of her departure from [facility]. AVS given to patient as well as script for lasix. Patient and mother informed that antibiotic scripts are to be picked up at [facility] ([facility] [address]). Pt and mother verbalized understanding, eager to get home.

## 2016-06-13 NOTE — Progress Notes (Signed)
Pt BP this morning was running low 74/49, paged the on call physician  from family medicine gave an order of 500 normal saline fluid bolus,  BP 77/50 after the bolus paged on call again gave another order of 500 normal saline fluid bolus still running, report given to Miranda  The first shift nurse that took over from me to check BP and page the attending when bolus finished

## 2016-06-13 NOTE — Progress Notes (Signed)
  Speech Language Pathology Treatment and Discharge Summary: (P) Dysphagia  Patient Details Name: Angela Page MRN: [id number] DOB: 12-20-1970 Today's Date: 06/13/2016 Time: (P) 1420-(P) 1459 SLP Time Calculation (min) (ACUTE ONLY): (P) 39 min  Assessment / Plan / Recommendation Clinical Impression  Patient seen for follow-up for training in compensatory techniques as well as to determine appropriateness for respiratory muscle training to improve swallow function. SLP provided education re: use of chin tuck, maximizing upright posture during PO intake to reduce risks for aspiration. MBS showed cued cough was insufficient for airway clearance; assessed pt's maximum inspiratory and expiratory pressures to determine if she is a candidate for RMT to improve cough effectiveness. Patient's Maximum Inspiratory Pressure (MIP) of 36 is within .5 of lower limit of normal, however Maximum Expiratory Pressure (MEP) at 23, well below Lower Limit of Normal (64.5). Discussed follow-up plans with pt and determined higher level EMST device is likely to be more appropriate although lowest resistance is slightly above pt's training target of 75% of MEP at 17.5. Provided training in use of device (given for home exercise), rationale for use and recommendation for f/u with SLP in [facility]. Education complete, pt discharging today.    HPI HPI: (P) Angela Tuckeris a 46 y.o. femalepresenting with shortness of breath and LE swelling. 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 SOB and SIRS, concern for aspiration. Pt has a history of mild oral and oropharyngeal dysphagia with MBS in 01/22/16 showing residuals, increased with thicker viscosity, Pt  encouraged to clear throat/expectorate residue.       SLP Plan  (P) Discharge SLP treatment due to (comment) (pt discharge pending)       Recommendations  Diet recommendations: (P) Regular;Thin liquid Liquids provided via: (P) Cup;Straw Medication Administration: (P) Whole meds with puree Supervision: (P) Staff to assist with self feeding;Intermittent supervision to cue for compensatory strategies Compensations: (P) Slow rate;Small sips/bites;Chin tuck;Clear throat after each swallow Postural Changes and/or Swallow Maneuvers: (P) Seated upright 90 degrees;Upright 30-60 min after meal                Oral Care Recommendations: (P) Oral care BID Follow up Recommendations: (P) 24 hour supervision/assistance;Home health SLP SLP Visit Diagnosis: (P) Dysphagia, pharyngeal phase (R13.13) Plan: (P) Discharge SLP treatment due to (comment) (pt discharge pending)       [place], [place], [place] Pathologist [place] 06/13/2016, 4:10 PM

## 2016-06-13 NOTE — Care Management Note (Addendum)
Case Management Note  Patient Details  Name: Angela Page MRN: [id number] Date of Birth: Aug 01, 1970  Subjective/Objective:                   [name] (Mother) [name] [id number] [phone]      Action/Plan: Plan is to d/c to home today with home health services. [facility] [phone]) will provide transportation to home. CM confirmed home address with mom.   Expected Discharge Date:    06/13/2016              Expected Discharge Plan:  [place]  In-House Referral:  NA  Discharge planning Services  CM Consult  Post Acute Care Choice:  Home Health, Resumption of Svcs/PTA Provider Choice offered to:  Parent  DME Arranged:  oxygen DME Agency:  Pt is active with [facility].  HH Arranged:  RN, Nurse's Aide [name]:  [facility], Rolanda Campa # [phone], [facility] Liaison made aware per CM of pt's d/c plan for today.  Status of Service:  completed  If discussed at Long Length of Stay Meetings, dates discussed:    Additional Comments:  Sharin Mons, RN 06/13/2016, 10:48 AM

## 2016-06-14 LAB — HEMOGLOBIN A1C
HEMOGLOBIN A1C: 5.6 % (ref 4.8–5.6)
MEAN PLASMA GLUCOSE: 114 mg/dL

## 2016-06-14 LAB — CULTURE, BLOOD (ROUTINE X 2)
CULTURE: NO GROWTH
Culture: NO GROWTH
SPECIAL REQUESTS: ADEQUATE
Special Requests: ADEQUATE

## 2016-06-19 NOTE — Progress Notes (Signed)
Cardiology Office Note    Date:  06/20/2016   ID:  Angela Page, DOB [birth year], MRN [id number]  PCP:  System, Provider Not In  Cardiologist:  New to Dr. Radford Pax (previously followed at [facility])  Chief Complaint: CHF?  History of Present Illness:   Angela Page is a 46 y.o. 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 Ree Kida.  Patient has a traumatic spinal injury after fall in 09/2013. It was felt due to SSS s/p PPM. She hasn't seen any cardiologist since then. Remote device check every 3 months. No prior hx of stroke or MI.   Recently went to [facility] for 5/22 with worsening dyspnea and LE edema. Admitted for aspiration pneumonia. CTA with no evidence of PE, but complete consolidation of RLL and dense airspace opacification involving portions of the right upper and middle lobes likely 2/2 aspiration. Treated with abx. Patient had abdominal distension and mildly elevated transaminates. Albumin 2.7. Abd [name] negative for liver/gallbladder process.  Echocardiogram showed normal LV function 55-60% and normal left ventricle and diastolic dysfunction. Normal right ventricular systolic function and cavity size. No DVT on LE doppler.  Her edema improved on lasix. Felt due to venous insufficiency and protein calorie malnutrition. Increase lasix to 40mg  qd (she was taking 20 mg every other day).   Here for further evaluation. She has home health nurse 16 hours/day. She is bed bound. Her edema has been improving. Still has abdominal distension. Her dyspnea is stable on supplemental oxygen. She denies chest pain, palpitation and dizziness.   Past Medical History:  Diagnosis Date  . Asthma   . GERD  (gastroesophageal reflux disease)   . HCAP (healthcare-associated pneumonia) 06/09/2016  . Pacemaker   . Pleurisy   . Protein calorie malnutrition ([place]) 10/31/2015  . Quadriparesis ([place])   . Quadriplegia, C5-C7 incomplete ([place]) 10/08/2013  . Stage IV pressure ulcer of sacral region ([place]) 10/31/2015    Past Surgical History:  Procedure Laterality Date  . APPENDECTOMY    . BACK SURGERY  10/08/2013   fusion of spine, cervical region  . CARDIAC SURGERY    . GASTROSTOMY  11/23/2015  . I&D EXTREMITY  10/28/2011   Procedure: IRRIGATION AND DEBRIDEMENT EXTREMITY;  Surgeon: Tennis Must, MD;  Location: [place];  Service: Orthopedics;  Laterality: Right;  Irrigation and debridement right middle finger  . IR GENERIC HISTORICAL  11/23/2015   IR GASTROSTOMY TUBE MOD SED 11/23/2015 [facility]-INTERV RAD  . TRACHEOSTOMY      Current Medications: Prior to Admission medications   Medication Sig Start Date End Date Taking? Authorizing Provider  acetaminophen (TYLENOL) 500 MG tablet Place 1 tablet (500 mg total) into feeding tube every 6 (six) hours as needed for moderate pain. Patient taking differently: Place 500-1,000 mg into feeding tube every 6 (six) hours as needed for moderate pain.  11/26/15   Florencia Reasons, MD  albuterol (PROVENTIL) (2.5 MG/3ML) 0.083% nebulizer solution Take 2.5 mg by nebulization every 6 (six) hours as needed for wheezing or shortness of breath.    [provider]  Amino Acids-Protein Hydrolys (FEEDING SUPPLEMENT, PRO-STAT SUGAR FREE 64,) LIQD Place 30 mLs into feeding tube 2 (two) times daily. Patient taking differently: Place 30 mLs into  feeding tube daily.  11/26/15   Florencia Reasons, MD  baclofen (LIORESAL) 20 MG tablet Take 20 mg by mouth 3 (three) times daily. 12/14/15   [provider]  collagenase (SANTYL) ointment Apply topically daily. Patient taking differently: Apply 1 application topically daily as needed (wound care).  11/26/15   Florencia Reasons, MD  [facility]  VITAMIN B12 1000 MCG tablet Place 1 tablet (1,000 mcg total) into feeding tube daily. 11/26/15   Florencia Reasons, MD  DULoxetine (CYMBALTA) 30 MG capsule Take 30 mg by mouth 2 (two) times daily. 11/30/15   [provider]  furosemide (LASIX) 20 MG tablet Take 2 tablets (40 mg total) by mouth daily. 06/13/16   Lovenia Kim, MD  gabapentin (NEURONTIN) 400 MG capsule Take 400 mg by mouth 4 (four) times daily. 10/16/15   [provider]  ibuprofen (ADVIL,MOTRIN) 200 MG tablet Place 2 tablets (400 mg total) into feeding tube every 6 (six) hours as needed for moderate pain. 11/26/15   Florencia Reasons, MD  lidocaine (LIDODERM) 5 % Place 1 patch onto the skin 2 (two) times daily as needed (pain).  04/04/15   [provider]  midodrine (PROAMATINE) 10 MG tablet Place 1 tablet (10 mg total) into feeding tube 3 (three) times daily with meals. 11/26/15   Florencia Reasons, MD  neomycin-bacitracin-polymyxin (NEOSPORIN) ointment Apply 1 application topically daily. apply to eye Patient taking differently: Apply 1 application topically daily as needed for wound care. apply to eye 11/26/15   Florencia Reasons, MD  Nutritional Supplements (FEEDING SUPPLEMENT, OSMOLITE 1.5 CAL,) LIQD Place 1,000 mLs into feeding tube daily. 11/27/15   Florencia Reasons, MD  oxyCODONE (OXY IR/ROXICODONE) 5 MG immediate release tablet Place 1 tablet (5 mg total) into feeding tube every 6 (six) hours as needed for moderate pain or severe pain. Patient taking differently: Place 10 mg into feeding tube every 4 (four) hours as needed for moderate pain or severe pain.  01/23/16   Oswald Hillock, MD  polyethylene glycol (MIRALAX / GLYCOLAX) packet Place 17 g into feeding tube daily. Patient taking differently: Place 17 g into feeding tube daily as needed for moderate constipation.  11/27/15   Florencia Reasons, MD  ranitidine (ZANTAC) 150 MG/10ML syrup Place 10 mLs (150 mg total) into feeding tube every 12 (twelve) hours. Patient taking differently: Place 150 mg into feeding  tube 2 (two) times daily as needed for heartburn.  11/26/15   Florencia Reasons, MD  silver sulfADIAZINE (SILVADENE) 1 % cream Apply 1 application topically every other day as needed (for wound care).    [provider]  temazepam (RESTORIL) 7.5 MG capsule Take 7.5 mg by mouth at bedtime. 05/21/16   [provider]  tiotropium (SPIRIVA HANDIHALER) 18 MCG inhalation capsule Place 1 capsule (18 mcg total) into inhaler and inhale daily. 02/20/16   Erick Colace, NP  tiZANidine (ZANAFLEX) 4 MG tablet Take 4 mg by mouth 3 (three) times daily. 11/30/15   [provider]  Water For Irrigation, Sterile (FREE WATER) SOLN Place 100 mLs into feeding tube every 8 (eight) hours. 11/26/15   Florencia Reasons, MD  zinc oxide (BALMEX) 11.3 % CREA cream Apply 1 application topically 3 (three) times daily as needed (for skin redness/irritation).    [provider]    Allergies:   Erythromycin; Nitrofurantoin monohyd macro; and Penicillins   Social History   Social History  . Marital status: Divorced    Spouse name: N/A  . Number of children: N/A  .  Years of education: N/A   Social History Main Topics  . Smoking status: Current Every Day Smoker    Packs/day: 1.00    Years: 25.00    Types: Cigarettes  . Smokeless tobacco: Never Used  . Alcohol use Yes     Comment: 2 x weekly  . Drug use: No  . Sexual activity: No   Other Topics Concern  . None   Social History Narrative  . None     Family History:  The patient's family history includes Hypertension in her maternal uncle.   ROS:   Please see the history of present illness.    ROS All other systems reviewed and are negative.   PHYSICAL EXAM:   VS:  BP (!) 77/58   Pulse 80   Ht 5\' 2"  (1.575 m)   LMP 05/27/2016 (Within Days) Comment: Irregular periods since October 2015.  GEN: Pleasant chronically ill-appearing female laying in stretcher in no acute distress  HEENT: norma.. Has  tracheotomy  Neck: no JVD, carotid bruits, or  masses Cardiac: RRR; no murmurs, rubs, or gallops, trace edema (has compression stocking) Respiratory:  clear to auscultation bilaterally, normal work of breathing GI: soft, nontender,distended, + BS MS: quadraplegia  Skin: warm and dry, no rash Neuro:  Alert and Oriented x  Psych: euthymic mood, full affect  Wt Readings from Last 3 Encounters:  06/13/16 119 lb 0.8 oz (54 kg)  01/20/16 95 lb 0.3 oz (43.1 kg)  11/27/15 95 lb 0.3 oz (43.1 kg)      Studies/Labs Reviewed:   EKG:  EKG is not ordered today.  The ekg ordered 06/10/16 demonstrates NSR at rate of 91 bpm  Recent Labs: 11/25/2015: Magnesium 2.2 06/09/2016: B Natriuretic Peptide 150.8; TSH 1.456 06/10/2016: ALT 72 06/13/2016: BUN 20; Creatinine, Ser 0.42; Hemoglobin 10.5; Platelets 248; Potassium 3.7; Sodium 138   Lipid Panel No results found for: CHOL, TRIG, HDL, CHOLHDL, VLDL, LDLCALC, LDLDIRECT  Additional studies/ records that were reviewed today include:   Echocardiogram: 06/10/16 Study Conclusions  - Left ventricle: The cavity size was normal. Systolic function was   normal. The estimated ejection fraction was in the range of 55%   to 60%. Wall motion was normal; there were no regional wall   motion abnormalities. Left ventricular diastolic function   parameters were normal. - Line: A probable venous catheter was visualized in the superior   vena cava, with its tip in the right atrium. No abnormal features   noted.    ASSESSMENT & PLAN:    1. Bilateral lower extremity edema/abdominal distention - Her edema has been improving with increased dose of Lasix. Likely due to protein calorie malnutrition. Albumin 2.7. Cardiogram showed normal LV systolic and diastolic function. Normal right ventricular cavity and function. Continue Lasix 40 mg daily. Follow-up with primary care provider for further evaluation.  2. SSS s/p PPM - On remote device check. She will establish care with EP.   Discussed with DOD Dr.  Radford Pax.   Medication Adjustments/Labs and Tests Ordered: Current medicines are reviewed at length with the patient today.  Concerns regarding medicines are outlined above.  Medication changes, Labs and Tests ordered today are listed in the Patient Instructions below. Patient Instructions  Medication Instructions:  None ordered  Labwork: None ordered  Testing/Procedures: None ordered  Follow-Up: Your physician recommends that you schedule a follow-up appointment in: 07/02/16 ARRIVE AT 2:15 TO SEE DR. KLEIN TO FOLLOW YOUR PACE MAKER.  PLEASE CALL us WITH THE DRS NAME THAT  SENDS YOU THE LETTER ABOUT YOUR DEVICE.  Any Other Special Instructions Will Be Listed Below (If Applicable).     If you need a refill on your cardiac medications before your next appointment, please call your pharmacy.      Jarrett Soho, [place]  06/20/2016 [facility] [address], [place], [place]  [address] Phone: [phone]; Fax: [phone]

## 2016-06-20 ENCOUNTER — Ambulatory Visit (INDEPENDENT_AMBULATORY_CARE_PROVIDER_SITE_OTHER): Payer: Medicare Other | Admitting: Physician Assistant

## 2016-06-20 ENCOUNTER — Encounter: Payer: Self-pay | Admitting: Physician Assistant

## 2016-06-20 VITALS — BP 77/58 | HR 80 | Ht 62.0 in

## 2016-06-20 DIAGNOSIS — Z950 Presence of cardiac pacemaker: Secondary | ICD-10-CM | POA: Diagnosis not present

## 2016-06-20 DIAGNOSIS — R600 Localized edema: Secondary | ICD-10-CM

## 2016-06-20 DIAGNOSIS — R140 Abdominal distension (gaseous): Secondary | ICD-10-CM

## 2016-06-20 DIAGNOSIS — I495 Sick sinus syndrome: Secondary | ICD-10-CM | POA: Diagnosis not present

## 2016-06-20 NOTE — Patient Instructions (Addendum)
Medication Instructions:  None ordered  Labwork: None ordered  Testing/Procedures: None ordered  Follow-Up: Your physician recommends that you schedule a follow-up appointment in: 07/02/16 ARRIVE AT 2:15 TO SEE DR. KLEIN TO FOLLOW YOUR PACE MAKER.  PLEASE CALL us WITH THE DRS NAME THAT SENDS YOU THE LETTER ABOUT YOUR DEVICE.  Any Other Special Instructions Will Be Listed Below (If Applicable).     If you need a refill on your cardiac medications before your next appointment, please call your pharmacy.

## 2016-06-23 ENCOUNTER — Encounter (HOSPITAL_COMMUNITY): Payer: Self-pay

## 2016-06-23 ENCOUNTER — Inpatient Hospital Stay (HOSPITAL_COMMUNITY)
Admission: EM | Admit: 2016-06-23 | Discharge: 2016-06-27 | DRG: 689 | Disposition: A | Discharge status: Home Health | Payer: Medicare Other | Attending: Internal Medicine | Admitting: Internal Medicine

## 2016-06-23 ENCOUNTER — Encounter (HOSPITAL_BASED_OUTPATIENT_CLINIC_OR_DEPARTMENT_OTHER): Payer: Medicare Other | Attending: Internal Medicine

## 2016-06-23 DIAGNOSIS — N319 Neuromuscular dysfunction of bladder, unspecified: Secondary | ICD-10-CM | POA: Diagnosis present

## 2016-06-23 DIAGNOSIS — Z8701 Personal history of pneumonia (recurrent): Secondary | ICD-10-CM | POA: Diagnosis not present

## 2016-06-23 DIAGNOSIS — F329 Major depressive disorder, single episode, unspecified: Secondary | ICD-10-CM | POA: Diagnosis present

## 2016-06-23 DIAGNOSIS — N390 Urinary tract infection, site not specified: Secondary | ICD-10-CM | POA: Diagnosis not present

## 2016-06-23 DIAGNOSIS — Z880 Allergy status to penicillin: Secondary | ICD-10-CM

## 2016-06-23 DIAGNOSIS — I509 Heart failure, unspecified: Secondary | ICD-10-CM | POA: Insufficient documentation

## 2016-06-23 DIAGNOSIS — I959 Hypotension, unspecified: Secondary | ICD-10-CM | POA: Diagnosis present

## 2016-06-23 DIAGNOSIS — G8929 Other chronic pain: Secondary | ICD-10-CM | POA: Diagnosis present

## 2016-06-23 DIAGNOSIS — R140 Abdominal distension (gaseous): Secondary | ICD-10-CM

## 2016-06-23 DIAGNOSIS — N3001 Acute cystitis with hematuria: Principal | ICD-10-CM | POA: Diagnosis present

## 2016-06-23 DIAGNOSIS — D509 Iron deficiency anemia, unspecified: Secondary | ICD-10-CM | POA: Diagnosis present

## 2016-06-23 DIAGNOSIS — J45909 Unspecified asthma, uncomplicated: Secondary | ICD-10-CM | POA: Diagnosis present

## 2016-06-23 DIAGNOSIS — Z87440 Personal history of urinary (tract) infections: Secondary | ICD-10-CM

## 2016-06-23 DIAGNOSIS — Z950 Presence of cardiac pacemaker: Secondary | ICD-10-CM | POA: Diagnosis not present

## 2016-06-23 DIAGNOSIS — G904 Autonomic dysreflexia: Secondary | ICD-10-CM | POA: Diagnosis present

## 2016-06-23 DIAGNOSIS — Z79899 Other long term (current) drug therapy: Secondary | ICD-10-CM

## 2016-06-23 DIAGNOSIS — L89154 Pressure ulcer of sacral region, stage 4: Secondary | ICD-10-CM | POA: Diagnosis present

## 2016-06-23 DIAGNOSIS — R310 Gross hematuria: Secondary | ICD-10-CM | POA: Diagnosis present

## 2016-06-23 DIAGNOSIS — Z9181 History of falling: Secondary | ICD-10-CM | POA: Diagnosis not present

## 2016-06-23 DIAGNOSIS — N3091 Cystitis, unspecified with hematuria: Secondary | ICD-10-CM | POA: Diagnosis not present

## 2016-06-23 DIAGNOSIS — I160 Hypertensive urgency: Secondary | ICD-10-CM | POA: Diagnosis present

## 2016-06-23 DIAGNOSIS — G8250 Quadriplegia, unspecified: Secondary | ICD-10-CM | POA: Insufficient documentation

## 2016-06-23 DIAGNOSIS — F17200 Nicotine dependence, unspecified, uncomplicated: Secondary | ICD-10-CM | POA: Diagnosis present

## 2016-06-23 DIAGNOSIS — Z931 Gastrostomy status: Secondary | ICD-10-CM | POA: Diagnosis not present

## 2016-06-23 DIAGNOSIS — F32A Depression, unspecified: Secondary | ICD-10-CM | POA: Diagnosis present

## 2016-06-23 DIAGNOSIS — G8254 Quadriplegia, C5-C7 incomplete: Secondary | ICD-10-CM | POA: Diagnosis present

## 2016-06-23 DIAGNOSIS — Z981 Arthrodesis status: Secondary | ICD-10-CM | POA: Diagnosis not present

## 2016-06-23 DIAGNOSIS — K219 Gastro-esophageal reflux disease without esophagitis: Secondary | ICD-10-CM | POA: Diagnosis present

## 2016-06-23 DIAGNOSIS — Z881 Allergy status to other antibiotic agents status: Secondary | ICD-10-CM

## 2016-06-23 DIAGNOSIS — Z9981 Dependence on supplemental oxygen: Secondary | ICD-10-CM

## 2016-06-23 DIAGNOSIS — D62 Acute posthemorrhagic anemia: Secondary | ICD-10-CM | POA: Diagnosis present

## 2016-06-23 DIAGNOSIS — E860 Dehydration: Secondary | ICD-10-CM | POA: Diagnosis present

## 2016-06-23 DIAGNOSIS — R7989 Other specified abnormal findings of blood chemistry: Secondary | ICD-10-CM | POA: Diagnosis present

## 2016-06-23 DIAGNOSIS — L89152 Pressure ulcer of sacral region, stage 2: Secondary | ICD-10-CM | POA: Insufficient documentation

## 2016-06-23 DIAGNOSIS — I9589 Other hypotension: Secondary | ICD-10-CM | POA: Diagnosis present

## 2016-06-23 DIAGNOSIS — I5032 Chronic diastolic (congestive) heart failure: Secondary | ICD-10-CM | POA: Diagnosis present

## 2016-06-23 DIAGNOSIS — Z8249 Family history of ischemic heart disease and other diseases of the circulatory system: Secondary | ICD-10-CM

## 2016-06-23 LAB — URINALYSIS, ROUTINE W REFLEX MICROSCOPIC: Squamous Epithelial / LPF: NONE SEEN

## 2016-06-23 LAB — CBC WITH DIFFERENTIAL/PLATELET
Basophils Absolute: 0.1 10*3/uL (ref 0.0–0.1)
Basophils Relative: 0 %
EOS ABS: 0.6 10*3/uL (ref 0.0–0.7)
EOS PCT: 4 %
HCT: 33 % — ABNORMAL LOW (ref 36.0–46.0)
Hemoglobin: 10.9 g/dL — ABNORMAL LOW (ref 12.0–15.0)
LYMPHS ABS: 2.6 10*3/uL (ref 0.7–4.0)
Lymphocytes Relative: 16 %
MCH: 31.1 pg (ref 26.0–34.0)
MCHC: 33 g/dL (ref 30.0–36.0)
MCV: 94 fL (ref 78.0–100.0)
MONO ABS: 1.1 10*3/uL — AB (ref 0.1–1.0)
Monocytes Relative: 7 %
Neutro Abs: 11.6 10*3/uL — ABNORMAL HIGH (ref 1.7–7.7)
Neutrophils Relative %: 73 %
PLATELETS: 425 10*3/uL — AB (ref 150–400)
RBC: 3.51 MIL/uL — AB (ref 3.87–5.11)
RDW: 13.2 % (ref 11.5–15.5)
WBC: 16 10*3/uL — AB (ref 4.0–10.5)

## 2016-06-23 LAB — COMPREHENSIVE METABOLIC PANEL
ALBUMIN: 3.7 g/dL (ref 3.5–5.0)
ALT: 35 U/L (ref 14–54)
AST: 38 U/L (ref 15–41)
Alkaline Phosphatase: 114 U/L (ref 38–126)
Anion gap: 15 (ref 5–15)
BILIRUBIN TOTAL: 0.2 mg/dL — AB (ref 0.3–1.2)
BUN: 55 mg/dL — AB (ref 6–20)
CHLORIDE: 87 mmol/L — AB (ref 101–111)
CO2: 34 mmol/L — ABNORMAL HIGH (ref 22–32)
CREATININE: 0.62 mg/dL (ref 0.44–1.00)
Calcium: 9.7 mg/dL (ref 8.9–10.3)
GFR calc Af Amer: >60 mL/min (ref >60)
GLUCOSE: 102 mg/dL — AB (ref 65–99)
Potassium: 3.8 mmol/L (ref 3.5–5.1)
Sodium: 136 mmol/L (ref 135–145)
Total Protein: 7.6 g/dL (ref 6.5–8.1)

## 2016-06-23 LAB — I-STAT BETA HCG BLOOD, ED (MC, WL, AP ONLY): I-stat hCG, quantitative: <5 m[IU]/mL (ref <5)

## 2016-06-23 LAB — I-STAT CG4 LACTIC ACID, ED: Lactic Acid, Venous: 1.34 mmol/L (ref 0.5–1.9)

## 2016-06-23 LAB — PROTIME-INR
INR: 0.92
Prothrombin Time: 12.4 seconds (ref 11.4–15.2)

## 2016-06-23 MED ORDER — DEXTROSE 5 % IV SOLN: 1.0000 g | Freq: Once | INTRAVENOUS | Status: DC

## 2016-06-23 MED ORDER — DULOXETINE HCL 30 MG PO CPEP
30.0000 mg | ORAL_CAPSULE | Freq: Two times a day (BID) | ORAL | Status: DC
  Administered 2016-06-24 – 2016-06-27 (×8): 30 mg via ORAL
  Filled 2016-06-23 (×8): qty 1

## 2016-06-23 MED ORDER — PRO-STAT SUGAR FREE PO LIQD
30.0000 mL | Freq: Two times a day (BID) | ORAL | Status: DC
  Administered 2016-06-24 – 2016-06-26 (×6): 30 mL via ORAL
  Filled 2016-06-23 (×6): qty 30

## 2016-06-23 MED ORDER — TIZANIDINE HCL 4 MG PO TABS
4.0000 mg | ORAL_TABLET | Freq: Once | ORAL | Status: AC
  Administered 2016-06-23: 4 mg via ORAL
  Filled 2016-06-23: qty 1

## 2016-06-23 MED ORDER — ACETAMINOPHEN 500 MG PO TABS: 1000.0000 mg | ORAL_TABLET | Freq: Four times a day (QID) | ORAL | Status: DC | PRN

## 2016-06-23 MED ORDER — BACLOFEN 20 MG PO TABS
20.0000 mg | ORAL_TABLET | Freq: Three times a day (TID) | ORAL | Status: DC
  Administered 2016-06-24 – 2016-06-27 (×11): 20 mg via ORAL
  Filled 2016-06-23 (×2): qty 2
  Filled 2016-06-23: qty 1
  Filled 2016-06-23 (×4): qty 2
  Filled 2016-06-23: qty 1
  Filled 2016-06-23 (×3): qty 2

## 2016-06-23 MED ORDER — GABAPENTIN 400 MG PO CAPS
400.0000 mg | ORAL_CAPSULE | Freq: Four times a day (QID) | ORAL | Status: DC
  Administered 2016-06-24 – 2016-06-27 (×13): 400 mg via ORAL
  Filled 2016-06-23 (×14): qty 1

## 2016-06-23 MED ORDER — TEMAZEPAM 7.5 MG PO CAPS
7.5000 mg | ORAL_CAPSULE | Freq: Every day | ORAL | Status: DC
  Administered 2016-06-24 – 2016-06-26 (×3): 7.5 mg via ORAL
  Filled 2016-06-23 (×4): qty 1

## 2016-06-23 MED ORDER — SODIUM CHLORIDE 0.9 % IV SOLN
INTRAVENOUS | Status: AC
  Administered 2016-06-24: 125 mL/h via INTRAVENOUS

## 2016-06-23 MED ORDER — OXYCODONE HCL 5 MG PO TABS
10.0000 mg | ORAL_TABLET | ORAL | Status: DC | PRN
  Administered 2016-06-24 – 2016-06-27 (×16): 10 mg via ORAL
  Filled 2016-06-23 (×17): qty 2

## 2016-06-23 MED ORDER — MIDODRINE HCL 5 MG PO TABS
10.0000 mg | ORAL_TABLET | Freq: Once | ORAL | Status: AC
  Administered 2016-06-23: 10 mg
  Filled 2016-06-23: qty 2

## 2016-06-23 MED ORDER — MEROPENEM 1 G IV SOLR
1.0000 g | Freq: Three times a day (TID) | INTRAVENOUS | Status: DC
  Administered 2016-06-24 – 2016-06-26 (×8): 1 g via INTRAVENOUS
  Filled 2016-06-23 (×10): qty 1

## 2016-06-23 MED ORDER — ALUM & MAG HYDROXIDE-SIMETH 200-200-20 MG/5ML PO SUSP: 30.0000 mL | Freq: Four times a day (QID) | ORAL | Status: DC | PRN

## 2016-06-23 MED ORDER — GABAPENTIN 400 MG PO CAPS
400.0000 mg | ORAL_CAPSULE | Freq: Once | ORAL | Status: AC
  Administered 2016-06-23: 400 mg via ORAL
  Filled 2016-06-23: qty 1

## 2016-06-23 MED ORDER — MIDODRINE HCL 5 MG PO TABS
10.0000 mg | ORAL_TABLET | Freq: Three times a day (TID) | ORAL | Status: DC
  Filled 2016-06-23: qty 2

## 2016-06-23 MED ORDER — SODIUM CHLORIDE 0.9% FLUSH
3.0000 mL | Freq: Two times a day (BID) | INTRAVENOUS | Status: DC
  Administered 2016-06-24 – 2016-06-27 (×6): 3 mL via INTRAVENOUS

## 2016-06-23 MED ORDER — MIDODRINE HCL 5 MG PO TABS
10.0000 mg | ORAL_TABLET | Freq: Once | ORAL | Status: AC
  Administered 2016-06-23: 10 mg via ORAL
  Filled 2016-06-23: qty 2

## 2016-06-23 MED ORDER — POLYETHYLENE GLYCOL 3350 17 G PO PACK: 17.0000 g | PACK | Freq: Every day | ORAL | Status: DC | PRN

## 2016-06-23 MED ORDER — TIZANIDINE HCL 4 MG PO TABS
4.0000 mg | ORAL_TABLET | Freq: Three times a day (TID) | ORAL | Status: DC
  Administered 2016-06-24 – 2016-06-27 (×11): 4 mg via ORAL
  Filled 2016-06-23 (×11): qty 1

## 2016-06-23 MED ORDER — OSMOLITE 1.2 CAL PO LIQD
100.0000 mL/h | ORAL | Status: DC
  Administered 2016-06-24: 100 mL/h

## 2016-06-23 MED ORDER — LIDOCAINE 5 % EX PTCH
1.0000 | MEDICATED_PATCH | Freq: Two times a day (BID) | CUTANEOUS | Status: DC | PRN
  Administered 2016-06-26: 1 via TRANSDERMAL
  Filled 2016-06-23 (×2): qty 1

## 2016-06-23 MED ORDER — OXYCODONE HCL 5 MG PO TABS
10.0000 mg | ORAL_TABLET | Freq: Once | ORAL | Status: AC
  Administered 2016-06-23: 10 mg via ORAL
  Filled 2016-06-23: qty 2

## 2016-06-23 MED ORDER — SODIUM CHLORIDE 0.9 % IV BOLUS (SEPSIS)
500.0000 mL | Freq: Once | INTRAVENOUS | Status: AC
  Administered 2016-06-24: 500 mL via INTRAVENOUS

## 2016-06-23 MED ORDER — ONDANSETRON HCL 4 MG/2ML IJ SOLN: 4.0000 mg | Freq: Four times a day (QID) | INTRAMUSCULAR | Status: DC | PRN

## 2016-06-23 MED ORDER — SODIUM CHLORIDE 0.9 % IV BOLUS (SEPSIS)
500.0000 mL | Freq: Once | INTRAVENOUS | Status: AC
  Administered 2016-06-23: 500 mL via INTRAVENOUS

## 2016-06-23 MED ORDER — ONDANSETRON HCL 4 MG PO TABS: 4.0000 mg | ORAL_TABLET | Freq: Four times a day (QID) | ORAL | Status: DC | PRN

## 2016-06-23 MED ORDER — BACITRACIN-NEOMYCIN-POLYMYXIN 400-5-5000 EX OINT: 1.0000 "application " | TOPICAL_OINTMENT | CUTANEOUS | Status: DC | PRN

## 2016-06-23 MED ORDER — ZINC OXIDE 11.3 % EX CREA
1.0000 "application " | TOPICAL_CREAM | Freq: Three times a day (TID) | CUTANEOUS | Status: DC | PRN
  Filled 2016-06-23: qty 56

## 2016-06-23 MED ORDER — FREE WATER
100.0000 mL | Freq: Three times a day (TID) | Status: DC
  Administered 2016-06-24 – 2016-06-27 (×10): 100 mL

## 2016-06-23 MED ORDER — TIOTROPIUM BROMIDE MONOHYDRATE 18 MCG IN CAPS
18.0000 ug | ORAL_CAPSULE | Freq: Every day | RESPIRATORY_TRACT | Status: DC
  Administered 2016-06-24 – 2016-06-27 (×4): 18 ug via RESPIRATORY_TRACT
  Filled 2016-06-23: qty 5

## 2016-06-23 MED ORDER — VITAMIN B-12 1000 MCG PO TABS
1000.0000 ug | ORAL_TABLET | Freq: Every day | ORAL | Status: DC
  Administered 2016-06-24 – 2016-06-27 (×4): 1000 ug
  Filled 2016-06-23 (×4): qty 1

## 2016-06-23 MED ORDER — ALBUTEROL SULFATE (2.5 MG/3ML) 0.083% IN NEBU: 2.5000 mg | INHALATION_SOLUTION | Freq: Four times a day (QID) | RESPIRATORY_TRACT | Status: DC | PRN

## 2016-06-23 NOTE — ED Triage Notes (Signed)
PT RECEIVED FROM HOME VIA EMS FOR HEMATURIA SINCE LAST WEEK. PT IS A QUADRIPLEGIC AND HAD HER FOLEY CATHETER CHANGED ON Friday DUE TO THE BLOOD. PT ALSO STS SHE HAS BEEN HAVING LOW GRADE FEVERS, NAUSEA, AND HER MENSTRUAL HAS RECENTLY RETURNED.

## 2016-06-23 NOTE — ED Provider Notes (Signed)
[facility] DEPT Provider Note   CSN: [id number] Arrival date & time: 06/23/16  1253     History   Chief Complaint Chief Complaint  Patient presents with  . Hematuria    HPI Angela Page is a 46 y.o. female.  40-year-old female with extensive past medical history including quadriparesis, pacemaker, trach and G-tube who presents with hematuria. Caregiver states that 3 days ago the patient began having hematuria and was noted to pass a large blood clot. Her Foley catheter was then changed and she has continued to have gross hematuria. They're concerned that she has clogged her tube as she has not had urine output this afternoon. She has not had any discomfort or pain but has no sensation in her lower body. She reports nausea but no vomiting. She had a fever of 101.9 axillary this morning at 3:30 AM and was given Tylenol. She was given Tylenol again at 8 AM. No diarrhea, bloody stools, or other complaints. She has had some abdominal distention recently which her doctors have felt is related to excess fluid.  Of note, the patient frequently has low blood pressure and takes midodrine 3 times daily for this problem. She has not had her afternoon dose today.   The history is provided by the patient and a caregiver.  Hematuria     Past Medical History:  Diagnosis Date  . Asthma   . GERD (gastroesophageal reflux disease)   . HCAP (healthcare-associated pneumonia) 06/09/2016  . Pacemaker   . Pleurisy   . Protein calorie malnutrition ([place]) 10/31/2015  . Quadriparesis ([place])   . Quadriplegia, C5-C7 incomplete ([place]) 10/08/2013  . Stage IV pressure ulcer of sacral region [facility]) 10/31/2015    Patient Active Problem List   Diagnosis Date Noted  . Autonomic neuropathy 06/10/2016  . Presence of permanent cardiac pacemaker 06/10/2016  . Neurogenic bladder 06/10/2016  . Chronically on opiate therapy 06/10/2016  . Indwelling Foley catheter present 06/10/2016  . PEG (percutaneous  endoscopic gastrostomy) status ([place]) 06/10/2016  . Pulmonary hypertension ([place]) 06/10/2016  . Anasarca 06/10/2016  . Hypoalbuminemia 06/10/2016  . Abnormal transaminases   . Distended abdomen   . Peripheral edema   . Tracheostomy dependence ([place])   . Chronic obstructive pulmonary disease ([place])   . Chronic diastolic CHF (congestive heart failure) ([place]) 01/23/2016  . Tracheostomy status ([place])   . Palliative care encounter   . Goals of care, counseling/discussion   . Esophageal dysphagia   . Shortness of breath   . Acute respiratory failure with hypoxemia ([place])   . Encounter for intubation   . Malnutrition of moderate degree 11/01/2015  . Acute encephalopathy 10/31/2015  . Tobacco abuse 10/31/2015  . Asthma 10/31/2015  . Pressure ulcer 02/26/2015  . HCAP (healthcare-associated pneumonia) 02/25/2015  . Lower urinary tract infectious disease 02/25/2015  . Hypotension 02/25/2015  . Anemia 02/25/2015  . Enterococcus UTI 02/03/2015  . Sepsis due to urinary tract infection ([place]) 01/31/2015  . RLL pneumonia ([place]) 01/31/2015  . Altered mental status 10/29/2014  . Encephalopathy, toxic 10/29/2014  . Toxic metabolic encephalopathy 08/65/7846  . Urinary tract infection due to Proteus 10/24/2014  . Leukocytosis 10/23/2014  . Hypokalemia 10/23/2014  . Decubitus ulcer of sacral region, stage 4 ([place]) 10/23/2014  . Anemia, iron deficiency 10/23/2014  . Severe sepsis ([place])   . Septic shock ([place]) 10/22/2014  . Quadriplegia, C5-C7 incomplete ([place]) 10/22/2014  . Depression   . Protein-calorie malnutrition, severe ([place]) 09/05/2014  . Overdose of opiate or related narcotic 09/02/2014  Past Surgical History:  Procedure Laterality Date  . APPENDECTOMY    . BACK SURGERY  10/08/2013   fusion of spine, cervical region  . CARDIAC SURGERY    . GASTROSTOMY  11/23/2015  . I&D EXTREMITY  10/28/2011   Procedure: IRRIGATION AND DEBRIDEMENT EXTREMITY;  Surgeon: Tennis Must, MD;  Location: [facility];  Service: Orthopedics;  Laterality: Right;  Irrigation and debridement right middle finger  . IR GENERIC HISTORICAL  11/23/2015   IR GASTROSTOMY TUBE MOD SED 11/23/2015 [facility]-INTERV RAD  . TRACHEOSTOMY      OB History    No data available       Home Medications    Prior to Admission medications   Medication Sig Start Date End Date Taking? Authorizing Provider  acetaminophen (TYLENOL) 500 MG tablet Take 1,000 mg by mouth every 6 (six) hours as needed for mild pain, moderate pain, fever or headache.    Yes [provider]  albuterol (PROVENTIL) (2.5 MG/3ML) 0.083% nebulizer solution Take 2.5 mg by nebulization every 6 (six) hours as needed for wheezing or shortness of breath.   Yes [provider]  alum & mag hydroxide-simeth (MAALOX/MYLANTA) 200-200-20 MG/5ML suspension Take 30 mLs by mouth every 6 (six) hours as needed for indigestion or heartburn.   Yes [provider]  baclofen (LIORESAL) 20 MG tablet Take 20 mg by mouth 3 (three) times daily. 12/14/15  Yes [provider]  [employer] VITAMIN B12 1000 MCG tablet Place 1 tablet (1,000 mcg total) into feeding tube daily. 11/26/15  Yes Florencia Reasons, MD  DULoxetine (CYMBALTA) 30 MG capsule Take 30 mg by mouth 2 (two) times daily. 11/30/15  Yes [provider]  furosemide (LASIX) 20 MG tablet Take 2 tablets (40 mg total) by mouth daily. 06/13/16  Yes Lovenia Kim, MD  gabapentin (NEURONTIN) 400 MG capsule Take 400 mg by mouth 4 (four) times daily. 10/16/15  Yes [provider]  ibuprofen (ADVIL,MOTRIN) 200 MG tablet Place 2 tablets (400 mg total) into feeding tube every 6 (six) hours as needed for moderate pain. Patient taking differently: Place 400-600 mg into feeding tube every 6 (six) hours as needed for fever, headache, mild pain, moderate pain or cramping.  11/26/15  Yes Florencia Reasons, MD  lidocaine (LIDODERM) 5 % Place 1 patch onto the skin 2 (two) times daily as needed (pain).  04/04/15   Yes [provider]  midodrine (PROAMATINE) 10 MG tablet Place 1 tablet (10 mg total) into feeding tube 3 (three) times daily with meals. 11/26/15  Yes Florencia Reasons, MD  neomycin-bacitracin-polymyxin (NEOSPORIN) ointment Apply 1 application topically as needed for wound care. apply to eye    Yes [provider]  Nutritional Supplements (FEEDING SUPPLEMENT, OSMOLITE 1.2 CAL,) LIQD Place 100 mLs into feeding tube See admin instructions. Given for 10 hours at a time.  Given between 2300 - 0900 the next day.   Yes [provider]  Nutritional Supplements (PROMOD) LIQD Take 30 mLs by mouth 2 (two) times daily.   Yes [provider]  Oxycodone HCl 10 MG TABS Take 10 mg by mouth every 4 (four) hours as needed (for pain).   Yes [provider]  OXYGEN Inhale 3 L into the lungs continuous.   Yes [provider]  polyethylene glycol (MIRALAX / GLYCOLAX) packet Take 17 g by mouth daily as needed (constipation).   Yes [provider]  temazepam (RESTORIL) 7.5 MG capsule Take 7.5 mg by mouth at bedtime.  05/21/16  Yes [provider]  tiotropium (SPIRIVA HANDIHALER) 18 MCG inhalation capsule Place 1 capsule (18 mcg total) into inhaler and inhale daily. 02/20/16  Yes Erick Colace, NP  tiZANidine (ZANAFLEX) 4 MG tablet Take 4 mg by mouth 3 (three) times daily. 11/30/15  Yes [provider]  Water For Irrigation, Sterile (FREE WATER) SOLN Place 100 mLs into feeding tube every 8 (eight) hours. 11/26/15  Yes Florencia Reasons, MD  zinc oxide (BALMEX) 11.3 % CREA cream Apply 1 application topically 3 (three) times daily as needed (for skin redness/irritation).   Yes [provider]  Nutritional Supplements (FEEDING SUPPLEMENT, OSMOLITE 1.5 CAL,) LIQD Place 1,000 mLs into feeding tube daily. Patient not taking: Reported on 06/23/2016 11/27/15   Florencia Reasons, MD    Family History Family History  Problem Relation Age of Onset  . Hypertension Maternal  Uncle     Social History Social History  Substance Use Topics  . Smoking status: Current Every Day Smoker    Packs/day: 1.00    Years: 25.00    Types: Cigarettes  . Smokeless tobacco: Never Used  . Alcohol use Yes     Comment: 2 x weekly     Allergies   Erythromycin; Nitrofurantoin monohyd macro; and Penicillins   Review of Systems Review of Systems  Genitourinary: Positive for hematuria.   All other systems reviewed and are negative except that which was mentioned in HPI   Physical Exam Updated Vital Signs BP 110/87   Pulse 80   Temp 98.9 F (37.2 C) (Oral)   Resp 15   Ht 5\' 2"  (1.575 m)   Wt 54 kg (119 lb)   LMP 05/27/2016 (Within Days) Comment: Irregular periods since October 2015.  SpO2 100%   BMI 21.77 kg/m   Physical Exam  Constitutional: She is oriented to person, place, and time. She appears well-developed and well-nourished. No distress.  HENT:  Head: Normocephalic and atraumatic.  Moist mucous membranes  Eyes: Conjunctivae are normal. Pupils are equal, round, and reactive to light.  Neck: Neck supple.  Cardiovascular: Normal rate, regular rhythm and normal heart sounds.   No murmur heard. Pulmonary/Chest: Effort normal.  Trach in place w/ no surrounding drainage, diminished b/l  Abdominal: Soft. Bowel sounds are normal. She exhibits distension (mild). There is no tenderness.  g-tube in place  Genitourinary:  Genitourinary Comments: Foley catheter in place with small amount of gross blood in bag  Musculoskeletal: She exhibits edema (BLE).  Neurological: She is alert and oriented to person, place, and time.  Fluent speech  Skin: Skin is warm and dry.  Psychiatric: She has a normal mood and affect. Judgment normal.  Nursing note and vitals reviewed.    ED Treatments / Results  Labs (all labs ordered are listed, but only abnormal results are displayed) Labs Reviewed  URINALYSIS, ROUTINE W REFLEX MICROSCOPIC - Abnormal; Notable for the  following:       Result Value   Color, Urine RED (*)    APPearance TURBID (*)    Glucose, UA   (*)    Value: TEST NOT REPORTED DUE TO COLOR INTERFERENCE OF URINE PIGMENT   Hgb urine dipstick   (*)    Value: TEST NOT REPORTED DUE TO COLOR INTERFERENCE OF URINE PIGMENT   Bilirubin Urine   (*)    Value: TEST NOT REPORTED DUE TO COLOR INTERFERENCE OF URINE PIGMENT   Ketones, ur   (*)    Value: TEST NOT REPORTED DUE TO  COLOR INTERFERENCE OF URINE PIGMENT   Protein, ur   (*)    Value: TEST NOT REPORTED DUE TO COLOR INTERFERENCE OF URINE PIGMENT   Nitrite   (*)    Value: TEST NOT REPORTED DUE TO COLOR INTERFERENCE OF URINE PIGMENT   Leukocytes, UA   (*)    Value: TEST NOT REPORTED DUE TO COLOR INTERFERENCE OF URINE PIGMENT   Bacteria, UA FEW (*)    All other components within normal limits  COMPREHENSIVE METABOLIC PANEL - Abnormal; Notable for the following:    Chloride 87 (*)    CO2 34 (*)    Glucose, Bld 102 (*)    BUN 55 (*)    Total Bilirubin 0.2 (*)    All other components within normal limits  CBC WITH DIFFERENTIAL/PLATELET - Abnormal; Notable for the following:    WBC 16.0 (*)    RBC 3.51 (*)    Hemoglobin 10.9 (*)    HCT 33.0 (*)    Platelets 425 (*)    Neutro Abs 11.6 (*)    Monocytes Absolute 1.1 (*)    All other components within normal limits  I-STAT BETA HCG BLOOD, ED ([facility], [facility], [facility] ONLY)  I-STAT CG4 LACTIC ACID, ED    EKG  EKG Interpretation None       Radiology No results found.  Procedures Procedures (including critical care time)  Medications Ordered in ED Medications  oxyCODONE (Oxy IR/ROXICODONE) immediate release tablet 10 mg (not administered)  midodrine (PROAMATINE) tablet 10 mg (not administered)  meropenem (MERREM) 1 g in sodium chloride 0.9 % 100 mL IVPB (not administered)  midodrine (PROAMATINE) tablet 10 mg (10 mg Per Tube Given 06/23/16 1624)  sodium chloride 0.9 % bolus 500 mL (0 mLs Intravenous Stopped 06/23/16 1738)  oxyCODONE (Oxy  IR/ROXICODONE) immediate release tablet 10 mg (10 mg Oral Given 06/23/16 1752)  tiZANidine (ZANAFLEX) tablet 4 mg (4 mg Oral Given 06/23/16 1752)  gabapentin (NEURONTIN) capsule 400 mg (400 mg Oral Given 06/23/16 1752)     Initial Impression / Assessment and Plan / ED Course  I have reviewed the triage vital signs and the nursing notes.  Pertinent labs & imaging results that were available during my care of the patient were reviewed by me and considered in my medical decision making (see chart for details).    PT w/ 3 days of gross hematuria, not improved after foley exchange. On exam, she was awake, comfortable, mentating appropriately. She was hypotensive but remainder of VS normal. Obtained above lab work which shows normal lactate, Cr 0.6, WBC 16,   Placed a 3-way Foley catheter and irrigated bladder first with ~1269ml irrigation to gravity, then several rounds of pressure irrigation manually, then more gravity irrigation. Irrigation clamped and pt observed, during which time she continued to have gross hematuria with clots. I discussed w/ urology, Dr. Jonny Ruiz, who felt that her symptoms were likely due to hemorrhagic cystitis. He will see the patient in consultation. Because of her ongoing gross hematuria with clots in the setting of likely cystitis given her reports of fever and leukocytosis, discussed admission with Dr. Myna Hidalgo, triad. I have ordered a dose of meropenem given her history of resistance. Patient admitted for further care.  Final Clinical Impressions(s) / ED Diagnoses   Final diagnoses:  Gross hematuria  Hemorrhagic cystitis    New Prescriptions New Prescriptions   No medications on file     Kyndel Egger, Wenda Overland, MD 06/23/16 2303

## 2016-06-23 NOTE — ED Notes (Signed)
Bed: WA02 Expected date:  Expected time:  Means of arrival:  Comments: EMS 46 yo foley problem

## 2016-06-23 NOTE — ED Notes (Signed)
ED Provider at bedside. 

## 2016-06-23 NOTE — H&P (Signed)
History and Physical    Angela Page JKK:[id number] DOB: 01-Oct-1970 DOA: 06/23/2016  PCP: System, Provider Not In   Patient coming from: Home  Chief Complaint: Gross hematuria, malaise  HPI: Angela Page is a 46 y.o. 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 Patient is accompanied by her caretaker who assists with the history. She reportedly been in her usual state of health until she noted gross hematuria 4 days ago. She was evaluated for this in the ED and clots were evacuated before she was discharged home. She she reports continued frank blood from the Foley and worsening nonspecific malaise and fatigue. She denies ever experiencing gross hematuria like this, but does report similar nonspecific malaise and fatigue with her recurrent UTIs. She denies chest pain, palpitations, headache, change in vision or hearing, vomiting, or diarrhea.  ED Course: Upon arrival to the ED, patient is found to be afebrile, saturating well on her usual 3 L/m of supplemental oxygen, hypotensive to 70/44, and with vitals otherwise stable. Chemistry panel reveals a BUN to creatinine ratio of 89. CBC is notable for a leukocytosis to 16,000, stable normocytic anemia with hemoglobin of 10.9, and a thrombocytosis with platelets 425,000. Lactic acid is reassuring at 1.34. Urinalysis is grossly bloody. Patient was given a 500 mL normal saline bolus, midodrine, oxycodone, and continuous bladder irrigation with removal of many clots. Urology was consulted by the ED physician and requests a medical admission. Patient remained intermittently hypotensive in the ED, but nontoxic in appearance and asymptomatic with the low blood pressure. There has not been any respiratory distress. She  will be admitted to the stepdown unit for ongoing evaluation and management gross hematuria with a leukocytosis, suprapubic tenderness, and malaise, most likely secondary to hemorrhagic cystitis.  Review of Systems:  All other systems reviewed and apart from HPI, are negative.  Past Medical History:  Diagnosis Date  . Asthma   . GERD (gastroesophageal reflux disease)   . HCAP (healthcare-associated pneumonia) 06/09/2016  . Pacemaker   . Pleurisy   . Protein calorie malnutrition ([place]) 10/31/2015  . Quadriparesis ([place])   . Quadriplegia, C5-C7 incomplete ([place]) 10/08/2013  . Stage IV pressure ulcer of sacral region ([place]) 10/31/2015    Past Surgical History:  Procedure Laterality Date  . APPENDECTOMY    . BACK SURGERY  10/08/2013   fusion of spine, cervical region  . CARDIAC SURGERY    . GASTROSTOMY  11/23/2015  . I&D EXTREMITY  10/28/2011   Procedure: IRRIGATION AND DEBRIDEMENT EXTREMITY;  Surgeon: Tennis Must, MD;  Location: [facility];  Service: Orthopedics;  Laterality: Right;  Irrigation and debridement right middle finger  . IR GENERIC HISTORICAL  11/23/2015   IR GASTROSTOMY TUBE MOD SED 11/23/2015 [facility]-INTERV RAD  . TRACHEOSTOMY       reports that she has been smoking Cigarettes.  She has a 25.00 pack-year smoking history. She has never used smokeless tobacco. She reports that she drinks alcohol. She reports that she does not use drugs.  Allergies  Allergen Reactions  . Erythromycin Nausea And Vomiting  . Nitrofurantoin Monohyd Macro Nausea And Vomiting  . Penicillins Hives    Has patient had a PCN reaction causing immediate rash, facial/tongue/throat swelling, SOB or lightheadedness with hypotension: Yes Has patient had a PCN reaction causing severe rash involving mucus  membranes or skin necrosis: Yes Has patient had a PCN reaction that required hospitalization Yes- went to the DR Has patient had a PCN reaction occurring within the last 10 years: No-childhood  allergy If all of the above answers are "NO", then may proceed with Cephalosporin use.     Family History  Problem Relation Age of Onset  . Hypertension Maternal Uncle      Prior to Admission medications   Medication Sig Start Date End Date Taking? Authorizing Provider  acetaminophen (TYLENOL) 500 MG tablet Take 1,000 mg by mouth every 6 (six) hours as needed for mild pain, moderate pain, fever or headache.    Yes [provider]  albuterol (PROVENTIL) (2.5 MG/3ML) 0.083% nebulizer solution Take 2.5 mg by nebulization every 6 (six) hours as needed for wheezing or shortness of breath.   Yes [provider]  alum & mag hydroxide-simeth (MAALOX/MYLANTA) 200-200-20 MG/5ML suspension Take 30 mLs by mouth every 6 (six) hours as needed for indigestion or heartburn.   Yes [provider]  baclofen (LIORESAL) 20 MG tablet Take 20 mg by mouth 3 (three) times daily. 12/14/15  Yes [provider]  [facility] VITAMIN B12 1000 MCG tablet Place 1 tablet (1,000 mcg total) into feeding tube daily. 11/26/15  Yes Florencia Reasons, MD  DULoxetine (CYMBALTA) 30 MG capsule Take 30 mg by mouth 2 (two) times daily. 11/30/15  Yes [provider]  furosemide (LASIX) 20 MG tablet Take 2 tablets (40 mg total) by mouth daily. 06/13/16  Yes Lovenia Kim, MD  gabapentin (NEURONTIN) 400 MG capsule Take 400 mg by mouth 4 (four) times daily. 10/16/15  Yes [provider]  ibuprofen (ADVIL,MOTRIN) 200 MG tablet Place 2 tablets (400 mg total) into feeding tube every 6 (six) hours as needed for moderate pain. Patient taking differently: Place 400-600 mg into feeding tube every 6 (six) hours as needed for fever, headache, mild pain, moderate pain or cramping.  11/26/15  Yes Florencia Reasons, MD  lidocaine (LIDODERM) 5 % Place 1 patch onto the skin 2 (two) times daily as needed (pain).  04/04/15  Yes [provider]  midodrine (PROAMATINE) 10 MG tablet Place 1 tablet (10 mg total) into feeding  tube 3 (three) times daily with meals. 11/26/15  Yes Florencia Reasons, MD  neomycin-bacitracin-polymyxin (NEOSPORIN) ointment Apply 1 application topically as needed for wound care. apply to eye    Yes [provider]  Nutritional Supplements (FEEDING SUPPLEMENT, OSMOLITE 1.2 CAL,) LIQD Place 100 mLs into feeding tube See admin instructions. Given for 10 hours at a time.  Given between 2300 - 0900 the next day.   Yes [provider]  Nutritional Supplements (PROMOD) LIQD Take 30 mLs by mouth 2 (two) times daily.   Yes [provider]  Oxycodone HCl 10 MG TABS Take 10 mg by mouth every 4 (four) hours as needed (for pain).   Yes [provider]  OXYGEN Inhale 3 L into the lungs continuous.   Yes [provider]  polyethylene glycol (MIRALAX / GLYCOLAX) packet Take 17 g by mouth daily as needed (constipation).   Yes [provider]  temazepam (RESTORIL) 7.5 MG capsule Take 7.5 mg by mouth at bedtime. 05/21/16  Yes [provider]  tiotropium (SPIRIVA HANDIHALER) 18 MCG inhalation capsule Place 1 capsule (18 mcg total) into inhaler and inhale daily. 02/20/16  Yes Erick Colace, NP  tiZANidine (ZANAFLEX) 4 MG tablet Take 4 mg by mouth 3 (three) times daily. 11/30/15  Yes [provider]  Water For Irrigation, Sterile (FREE WATER) SOLN Place 100 mLs into feeding tube every 8 (eight) hours. 11/26/15  Yes Florencia Reasons, MD  zinc oxide (BALMEX) 11.3 % CREA cream Apply 1 application topically 3 (three) times daily as needed (for skin redness/irritation).   Yes [provider]  Nutritional Supplements (FEEDING SUPPLEMENT, OSMOLITE 1.5 CAL,) LIQD Place 1,000 mLs into feeding tube daily. Patient not taking: Reported on 06/23/2016 11/27/15   Florencia Reasons, MD    Physical Exam: Vitals:   06/23/16 1930 06/23/16 2000 06/23/16 2130 06/23/16 2319  BP: 110/90 (!) 70/44 110/87 (!) 56/36  Pulse: 79 80 80 80  Resp:  15  19  Temp:      TempSrc:      SpO2:  100% 100% 100% 100%  Weight:      Height:          Constitutional: No acute distress, calm. No pallor or diaphoresis. Non-toxic in appearance.  Eyes: PERTLA, lids and conjunctivae normal ENMT: Mucous membranes are dry. Posterior pharynx clear of any exudate or lesions.   Neck: normal, supple, no masses, no thyromegaly Respiratory: clear to auscultation bilaterally, no wheezing, no crackles. Normal respiratory effort.    Cardiovascular: S1 & S2 heard, regular rate and rhythm. No extremity edema. 2+ pedal pulses. No significant JVD. Abdomen: No distension, soft, suprapubic tenderness without rebound pain or guarding. Bowel sounds normal.  Musculoskeletal: no clubbing / cyanosis. Flexion contractures to all 4 extremities.  Skin: no significant rashes, lesions, ulcers. Warm, dry, well-perfused. Neurologic: CN 2-12 grossly intact. Sensation intact, spastic quadriplegia.  Psychiatric: Alert and oriented x 3. Calm and cooperative.     Labs on Admission: I have personally reviewed following labs and imaging studies  CBC:  Recent Labs Lab 06/23/16 1500  WBC 16.0*  NEUTROABS 11.6*  HGB 10.9*  HCT 33.0*  MCV 94.0  PLT 409*   Basic Metabolic Panel:  Recent Labs Lab 06/23/16 1500  NA 136  K 3.8  CL 87*  CO2 34*  GLUCOSE 102*  BUN 55*  CREATININE 0.62  CALCIUM 9.7   GFR: Estimated Creatinine Clearance: 70.2 mL/min (by C-G formula based on SCr of 0.62 mg/dL). Liver Function Tests:  Recent Labs Lab 06/23/16 1500  AST 38  ALT 35  ALKPHOS 114  BILITOT 0.2*  PROT 7.6  ALBUMIN 3.7   No results for input(s): LIPASE, AMYLASE in the last 168 hours. No results for input(s): AMMONIA in the last 168 hours. Coagulation Profile: No results for input(s): INR, PROTIME in the last 168 hours. Cardiac Enzymes: No results for input(s): CKTOTAL, CKMB, CKMBINDEX, TROPONINI in the last 168 hours. BNP (last 3 results) No results for input(s): PROBNP in the last 8760 hours. HbA1C: No  results for input(s): HGBA1C in the last 72 hours. CBG: No results for input(s): GLUCAP in the last 168 hours. Lipid Profile: No results for input(s): CHOL, HDL, LDLCALC, TRIG, CHOLHDL, LDLDIRECT in the last 72 hours. Thyroid Function Tests: No results for input(s): TSH, T4TOTAL, FREET4, T3FREE, THYROIDAB in the last 72 hours. Anemia Panel: No results for input(s): VITAMINB12, FOLATE, FERRITIN, TIBC, IRON, RETICCTPCT in the last 72 hours. Urine analysis:    Component Value Date/Time   COLORURINE RED (A) 06/23/2016 1322   APPEARANCEUR TURBID (A) 06/23/2016 1322   LABSPEC  06/23/2016 1322    TEST NOT REPORTED DUE TO COLOR INTERFERENCE OF URINE PIGMENT   PHURINE  06/23/2016 1322    TEST NOT REPORTED DUE TO COLOR  INTERFERENCE OF URINE PIGMENT   GLUCOSEU (A) 06/23/2016 1322    TEST NOT REPORTED DUE TO COLOR INTERFERENCE OF URINE PIGMENT   HGBUR (A) 06/23/2016 1322    TEST NOT REPORTED DUE TO COLOR INTERFERENCE OF URINE PIGMENT   BILIRUBINUR (A) 06/23/2016 1322    TEST NOT REPORTED DUE TO COLOR INTERFERENCE OF URINE PIGMENT   KETONESUR (A) 06/23/2016 1322    TEST NOT REPORTED DUE TO COLOR INTERFERENCE OF URINE PIGMENT   PROTEINUR (A) 06/23/2016 1322    TEST NOT REPORTED DUE TO COLOR INTERFERENCE OF URINE PIGMENT   UROBILINOGEN 0.2 10/29/2014 1521   NITRITE (A) 06/23/2016 1322    TEST NOT REPORTED DUE TO COLOR INTERFERENCE OF URINE PIGMENT   LEUKOCYTESUR (A) 06/23/2016 1322    TEST NOT REPORTED DUE TO COLOR INTERFERENCE OF URINE PIGMENT   Sepsis Labs: @LABRCNTIP (procalcitonin:4,lacticidven:4) )No results found for this or any previous visit (from the past 240 hour(s)).   Radiological Exams on Admission: No results found.  EKG: Not performed.    Assessment/Plan  1. Gross hematuria  - Pt presents with 4 days of gross hematuria and non-specific malaise and fatigue  - H&H noted to be stable, leukocytosis present, afebrile here  - She has recurrent UTI's and this is suspected  secondary to hemorrhagic cystitis  - Urology is consulting and much appreciated; will follow-up on recommendations - CBI started in ED with removal of clots; has persistent bloody output  - Plan to treat with abx as below, follow H&H, provide supportive care  2. Recurrent UTI  - Pt has chronic Foley and recurrent UTI's, including MDR acinetobacter recently  - Will treat empirically with meropenem pending culture data   3. Hypotension  - She has been hypotensive in ED and shock was considered, but she is afebrile, well-appearing, and with reassuring lactate; furthermore, she is chronically hypotensive, asymptomatic, and requiring midodrine TID at home  - BUN-to-Cr ratio is 89, there is poor skin turgor, and dehydration is likely contributing  - Plan to give IVF (mindful of diastolic CHF hx), hold diuretic, and continue midodrine   4. Depresssion - Stable, continue duloxetine   5. Chronic pain  - Stable, will continue home regimen with gabapentin, lidocaine patch, APAP, Oxy IR  - Continue bowel regimen   6. Normocytic anemia  - Hgb is 10.9 on admission and stable relative to priors  - Continue B12 supplementation, obtain type & screen and repeat CBC in am given the gross hematuria   7. Spastic quadriplegia - Continue baclofen, tube feeds  - She has sacral pressure ulcers present on admission and wound care consultation is requested     DVT prophylaxis: SCD's  Code Status: Full  Family Communication: Caretaker updated at bedside Disposition Plan: Admit to SDU Consults called: Urology Admission status: Inpatient    Vianne Bulls, MD [facility] Pager [phone]  If 7PM-7AM, please contact night-coverage [email or web address] Password [email or web address]  06/23/2016, 11:28 PM

## 2016-06-23 NOTE — ED Notes (Signed)
RT at bedside.

## 2016-06-23 NOTE — Consult Note (Signed)
New Consult Note  Requesting Physician: Vianne Bulls, MD  Service Requesting Consult: Emergency Medicine  Urology Consult Attending: Denea Cheaney Reason for Consult:  Gross hematuria  Subjective: Angela Page is seen in consultation for reasons noted above at the request of Opyd, Ilene Qua, MD in the [facility] ED.  This is a 46 yo patient with a history of quadriplegia due to fall in 2015, SSS with pacemaker in place, hypotension on Midodrine, tobacco abuse, tracheostomy on 3L O2, GERD, with recent admission for pneumonia and UTI. She presents with progressive gross hematuria in her foley catheter. Patient has been managing her bladder since the accident with chronic urethral foley. It was last changed on Friday. She had progressive hematuria over the weekend. She reports what sounds like an episode of autonomic dysreflexia earlier today likely associated with blockage of her foley catheter. A 22Fr 3-way foley has been placed by the ED and CBI has been started. She lives at home with her mother and has twice daily home health.    Past Medical History: Past Medical History:  Diagnosis Date  . Asthma   . GERD (gastroesophageal reflux disease)   . HCAP (healthcare-associated pneumonia) 06/09/2016  . Pacemaker   . Pleurisy   . Protein calorie malnutrition ([place]) 10/31/2015  . Quadriparesis ([place])   . Quadriplegia, C5-C7 incomplete ([place]) 10/08/2013  . Stage IV pressure ulcer of sacral region [facility]) 10/31/2015    Past Surgical History:  Past Surgical History:  Procedure Laterality Date  . APPENDECTOMY    . BACK SURGERY  10/08/2013   fusion of spine, cervical region  . CARDIAC SURGERY    . GASTROSTOMY  11/23/2015  . I&D EXTREMITY  10/28/2011   Procedure: IRRIGATION AND DEBRIDEMENT EXTREMITY;  Surgeon: Tennis Must, MD;  Location: [place];  Service: Orthopedics;  Laterality: Right;  Irrigation and debridement right middle finger  . IR GENERIC HISTORICAL  11/23/2015   IR  GASTROSTOMY TUBE MOD SED 11/23/2015 [facility]-INTERV RAD  . TRACHEOSTOMY      Medication: Current Facility-Administered Medications  Medication Dose Route Frequency Provider Last Rate Last Dose  . 0.9 %  sodium chloride infusion   Intravenous Continuous Opyd, Ilene Qua, MD      . acetaminophen (TYLENOL) tablet 1,000 mg  1,000 mg Oral Q6H PRN Opyd, Ilene Qua, MD      . albuterol (PROVENTIL) (2.5 MG/3ML) 0.083% nebulizer solution 2.5 mg  2.5 mg Nebulization Q6H PRN Opyd, Ilene Qua, MD      . alum & mag hydroxide-simeth (MAALOX/MYLANTA) 200-200-20 MG/5ML suspension 30 mL  30 mL Oral Q6H PRN Opyd, Ilene Qua, MD      . baclofen (LIORESAL) tablet 20 mg  20 mg Oral TID Opyd, Ilene Qua, MD      . DULoxetine (CYMBALTA) DR capsule 30 mg  30 mg Oral BID Opyd, Ilene Qua, MD      . feeding supplement (OSMOLITE 1.2 CAL) liquid 100 mL  100 mL Per Tube See admin instructions Opyd, Ilene Qua, MD      . free water 100 mL  100 mL Per Tube Q8H Opyd, Ilene Qua, MD      . Derrill Memo ON 06/24/2016] gabapentin (NEURONTIN) capsule 400 mg  400 mg Oral QID Opyd, Ilene Qua, MD      . lidocaine (LIDODERM) 5 % 1 patch  1 patch Transdermal BID PRN Opyd, Ilene Qua, MD      . meropenem (MERREM) 1 g in sodium chloride 0.9 % 100 mL  IVPB  1 g Intravenous Q8H Little, Wenda Overland, MD      . Derrill Memo ON 06/24/2016] midodrine (PROAMATINE) tablet 10 mg  10 mg Per Tube TID WC Opyd, Ilene Qua, MD      . neomycin-bacitracin-polymyxin (NEOSPORIN) ointment 1 application  1 application Topical PRN Opyd, Ilene Qua, MD      . ondansetron (ZOFRAN) tablet 4 mg  4 mg Oral Q6H PRN Opyd, Ilene Qua, MD       Or  . ondansetron (ZOFRAN) injection 4 mg  4 mg Intravenous Q6H PRN Opyd, Ilene Qua, MD      . oxyCODONE (Oxy IR/ROXICODONE) immediate release tablet 10 mg  10 mg Oral Q4H PRN Little, Wenda Overland, MD      . polyethylene glycol (MIRALAX / GLYCOLAX) packet 17 g  17 g Oral Daily PRN Opyd, Ilene Qua, MD      . PROMOD LIQD 30 mL  30 mL Oral BID Opyd, Ilene Qua, MD      . sodium chloride 0.9 % bolus 500 mL  500 mL Intravenous Once Opyd, Ilene Qua, MD      . sodium chloride flush (NS) 0.9 % injection 3 mL  3 mL Intravenous Q12H Opyd, Ilene Qua, MD      . temazepam (RESTORIL) capsule 7.5 mg  7.5 mg Oral QHS Opyd, Ilene Qua, MD      . Derrill Memo ON 06/24/2016] tiotropium (SPIRIVA) inhalation capsule 18 mcg  18 mcg Inhalation Daily Opyd, Ilene Qua, MD      . tiZANidine (ZANAFLEX) tablet 4 mg  4 mg Oral TID Vianne Bulls, MD      . Derrill Memo ON 06/24/2016] vitamin B-12 (CYANOCOBALAMIN) tablet 1,000 mcg  1,000 mcg Per Tube Daily Opyd, Ilene Qua, MD      . zinc oxide (BALMEX) 63.0 % cream 1 application  1 application Topical TID PRN Opyd, Ilene Qua, MD       Current Outpatient Prescriptions  Medication Sig Dispense Refill  . acetaminophen (TYLENOL) 500 MG tablet Take 1,000 mg by mouth every 6 (six) hours as needed for mild pain, moderate pain, fever or headache.     . albuterol (PROVENTIL) (2.5 MG/3ML) 0.083% nebulizer solution Take 2.5 mg by nebulization every 6 (six) hours as needed for wheezing or shortness of breath.    Marland Kitchen alum & mag hydroxide-simeth (MAALOX/MYLANTA) 200-200-20 MG/5ML suspension Take 30 mLs by mouth every 6 (six) hours as needed for indigestion or heartburn.    . baclofen (LIORESAL) 20 MG tablet Take 20 mg by mouth 3 (three) times daily.  3  . CVS VITAMIN B12 1000 MCG tablet Place 1 tablet (1,000 mcg total) into feeding tube daily. 30 tablet 0  . DULoxetine (CYMBALTA) 30 MG capsule Take 30 mg by mouth 2 (two) times daily.    . furosemide (LASIX) 20 MG tablet Take 2 tablets (40 mg total) by mouth daily. 30 tablet 2  . gabapentin (NEURONTIN) 400 MG capsule Take 400 mg by mouth 4 (four) times daily.  3  . ibuprofen (ADVIL,MOTRIN) 200 MG tablet Place 2 tablets (400 mg total) into feeding tube every 6 (six) hours as needed for moderate pain. (Patient taking differently: Place 400-600 mg into feeding tube every 6 (six) hours as needed for fever,  headache, mild pain, moderate pain or cramping. ) 30 tablet 0  . lidocaine (LIDODERM) 5 % Place 1 patch onto the skin 2 (two) times daily as needed (pain).   0  . midodrine (PROAMATINE) 10  MG tablet Place 1 tablet (10 mg total) into feeding tube 3 (three) times daily with meals. 90 tablet 0  . neomycin-bacitracin-polymyxin (NEOSPORIN) ointment Apply 1 application topically as needed for wound care. apply to eye     . Nutritional Supplements (FEEDING SUPPLEMENT, OSMOLITE 1.2 CAL,) LIQD Place 100 mLs into feeding tube See admin instructions. Given for 10 hours at a time.  Given between 2300 - 0900 the next day.    . Nutritional Supplements (PROMOD) LIQD Take 30 mLs by mouth 2 (two) times daily.    . Oxycodone HCl 10 MG TABS Take 10 mg by mouth every 4 (four) hours as needed (for pain).    . OXYGEN Inhale 3 L into the lungs continuous.    . polyethylene glycol (MIRALAX / GLYCOLAX) packet Take 17 g by mouth daily as needed (constipation).    . temazepam (RESTORIL) 7.5 MG capsule Take 7.5 mg by mouth at bedtime.    Marland Kitchen tiotropium (SPIRIVA HANDIHALER) 18 MCG inhalation capsule Place 1 capsule (18 mcg total) into inhaler and inhale daily. 30 capsule 12  . tiZANidine (ZANAFLEX) 4 MG tablet Take 4 mg by mouth 3 (three) times daily.    . Water For Irrigation, Sterile (FREE WATER) SOLN Place 100 mLs into feeding tube every 8 (eight) hours. 1000 mL 0  . zinc oxide (BALMEX) 11.3 % CREA cream Apply 1 application topically 3 (three) times daily as needed (for skin redness/irritation).    . Nutritional Supplements (FEEDING SUPPLEMENT, OSMOLITE 1.5 CAL,) LIQD Place 1,000 mLs into feeding tube daily. (Patient not taking: Reported on 06/23/2016) 1000 mL 0    Allergies: Allergies  Allergen Reactions  . Erythromycin Nausea And Vomiting  . Nitrofurantoin Monohyd Macro Nausea And Vomiting  . Penicillins Hives    Has patient had a PCN reaction causing immediate rash, facial/tongue/throat swelling, SOB or lightheadedness  with hypotension: Yes Has patient had a PCN reaction causing severe rash involving mucus membranes or skin necrosis: Yes Has patient had a PCN reaction that required hospitalization Yes- went to the DR Has patient had a PCN reaction occurring within the last 10 years: No-childhood allergy If all of the above answers are "NO", then may proceed with Cephalosporin use.     Social History: Social History  Substance Use Topics  . Smoking status: Current Every Day Smoker    Packs/day: 1.00    Years: 25.00    Types: Cigarettes  . Smokeless tobacco: Never Used  . Alcohol use Yes     Comment: 2 x weekly    Family History Family History  Problem Relation Age of Onset  . Hypertension Maternal Uncle     Review of Systems 10 systems were reviewed and are negative except as noted specifically in the HPI.  Objective: Vital signs in last 24 hours: BP (!) 56/36 Comment: informed the nurse  Pulse 80   Temp 98.9 F (37.2 C) (Oral)   Resp 20   Ht 5\' 2"  (1.575 m)   Wt 54 kg (119 lb)   LMP 05/27/2016 (Within Days) Comment: Irregular periods since October 2015.  SpO2 100%   BMI 21.77 kg/m   Intake/Output last 3 shifts: I/O last 3 completed shifts: In: 500 [IV Piggyback:500] Out: 200 [Urine:200]  Physical Exam General: NAD, A&O, resting, appropriate HEENT: [place]/AT, EOMI, MMM Pulmonary: Normal work of breathing, trach in place, O2 Cardiovascular: Regular rate, HDS, adequate peripheral perfusion Abdomen: soft GU: 22Fr 3-way in place with CBI running, cherry red urine with mild amount  of clot Extremities: warm and well perfused, mild edema Neuro: Appropriate. Quadriplegia.   Most Recent Labs: Lab Results  Component Value Date   WBC 16.0 (H) 06/23/2016   HGB 10.9 (L) 06/23/2016   HCT 33.0 (L) 06/23/2016   PLT 425 (H) 06/23/2016    Lab Results  Component Value Date   NA 136 06/23/2016   K 3.8 06/23/2016   CL 87 (L) 06/23/2016   CO2 34 (H) 06/23/2016   BUN 55 (H) 06/23/2016     CREATININE 0.62 06/23/2016   CALCIUM 9.7 06/23/2016   MG 2.2 11/25/2015   PHOS 2.3 (L) 11/03/2015    Lab Results  Component Value Date   ALKPHOS 114 06/23/2016   BILITOT 0.2 (L) 06/23/2016   BILIDIR 0.2 06/10/2016   PROT 7.6 06/23/2016   ALBUMIN 3.7 06/23/2016   ALT 35 06/23/2016   AST 38 06/23/2016    Lab Results  Component Value Date   INR 0.92 06/23/2016   APTT 32 06/10/2016    Urine Culture: 06/09/16: Multiple species.  IMAGING: No results found.   Assessment:  Patient is a 46 y.o. female with many medical problems, quadriplegia due to fall in 2015, SSS with pacemaker in place, hypotension on Midodrine, tobacco abuse, tracheostomy on 3L O2, GERD. Recently admitted for pneumonia and UTI  She presents with progressive gross hematuria and fevers. I suspect she has hemorrhagic cystitis. I irrigated her catheter and returned a mild amount of clot.   Recommendations: 1. Admission to medicine for treatment of likely hemorrhagic cystitis in setting of multiple medical problems.  2. Broad spectrum antibiotics following culture data as becomes available to guide treatment.  3. Continue CBI to keep urine thin and at least light red. May hand irrigate catheter with 60cc catheter tip syringe as needed for low urine output, worsening gross hematuria, or symptoms consistent with autonomic dysreflexia.  4. Patient needs follow-up with [name] electively as an outpatient for screening and management of her likely neurogenic bladder. She has had progressive urethral leakage likely secondary to erosion and also may benefit from a suprapubic catheter. Apparently CIC is not an option at home.   Thank you for this consult. Please do not hesitate to contact us with any further questions/concerns.  Lorayne Bender, MD PGY4 Urology Resident  I have examined and interviewed this patient and agree with the  Above assessment and plan.

## 2016-06-23 NOTE — ED Notes (Signed)
BLADDER IRRIGATION PERFORMED, DARK RED BLOOD RETURNED WITH MANY BLOOD CLOTS. DR. LITTLE MADE AWARE.

## 2016-06-24 DIAGNOSIS — I5032 Chronic diastolic (congestive) heart failure: Secondary | ICD-10-CM

## 2016-06-24 DIAGNOSIS — N3091 Cystitis, unspecified with hematuria: Secondary | ICD-10-CM

## 2016-06-24 LAB — TYPE AND SCREEN
ABO/RH(D): A POS
Antibody Screen: NEGATIVE

## 2016-06-24 LAB — BASIC METABOLIC PANEL
Anion gap: 10 (ref 5–15)
BUN: 46 mg/dL — AB (ref 6–20)
CO2: 35 mmol/L — ABNORMAL HIGH (ref 22–32)
CREATININE: 0.48 mg/dL (ref 0.44–1.00)
Calcium: 9.3 mg/dL (ref 8.9–10.3)
Chloride: 92 mmol/L — ABNORMAL LOW (ref 101–111)
GFR calc Af Amer: >60 mL/min (ref >60)
GLUCOSE: 92 mg/dL (ref 65–99)
POTASSIUM: 3.4 mmol/L — AB (ref 3.5–5.1)
SODIUM: 137 mmol/L (ref 135–145)

## 2016-06-24 LAB — CBC WITH DIFFERENTIAL/PLATELET
BASOS ABS: 0 10*3/uL (ref 0.0–0.1)
Basophils Relative: 0 %
EOS PCT: 5 %
Eosinophils Absolute: 0.5 10*3/uL (ref 0.0–0.7)
HCT: 31 % — ABNORMAL LOW (ref 36.0–46.0)
Hemoglobin: 10.2 g/dL — ABNORMAL LOW (ref 12.0–15.0)
LYMPHS PCT: 23 %
Lymphs Abs: 2.4 10*3/uL (ref 0.7–4.0)
MCH: 31.1 pg (ref 26.0–34.0)
MCHC: 32.9 g/dL (ref 30.0–36.0)
MCV: 94.5 fL (ref 78.0–100.0)
MONO ABS: 0.8 10*3/uL (ref 0.1–1.0)
Monocytes Relative: 8 %
Neutro Abs: 6.8 10*3/uL (ref 1.7–7.7)
Neutrophils Relative %: 64 %
PLATELETS: 340 10*3/uL (ref 150–400)
RBC: 3.28 MIL/uL — AB (ref 3.87–5.11)
RDW: 13.1 % (ref 11.5–15.5)
WBC: 10.5 10*3/uL (ref 4.0–10.5)

## 2016-06-24 LAB — GLUCOSE, CAPILLARY
GLUCOSE-CAPILLARY: 121 mg/dL — AB (ref 65–99)
Glucose-Capillary: 220 mg/dL — ABNORMAL HIGH (ref 65–99)
Glucose-Capillary: 154 mg/dL — ABNORMAL HIGH (ref 65–99)
Glucose-Capillary: 98 mg/dL (ref 65–99)
Glucose-Capillary: 102 mg/dL — ABNORMAL HIGH (ref 65–99)

## 2016-06-24 MED ORDER — LIP MEDEX EX OINT
1.0000 "application " | TOPICAL_OINTMENT | CUTANEOUS | Status: DC | PRN
  Filled 2016-06-24: qty 7

## 2016-06-24 MED ORDER — MIDODRINE HCL 5 MG PO TABS
10.0000 mg | ORAL_TABLET | Freq: Three times a day (TID) | ORAL | Status: DC
  Administered 2016-06-24 – 2016-06-25 (×6): 10 mg via ORAL
  Filled 2016-06-24 (×5): qty 2

## 2016-06-24 MED ORDER — CHLORHEXIDINE GLUCONATE 0.12 % MT SOLN
15.0000 mL | Freq: Two times a day (BID) | OROMUCOSAL | Status: DC
  Administered 2016-06-24 – 2016-06-27 (×5): 15 mL via OROMUCOSAL
  Filled 2016-06-24 (×5): qty 15

## 2016-06-24 MED ORDER — JEVITY 1.2 CAL PO LIQD: 1000.0000 mL | ORAL | Status: DC

## 2016-06-24 MED ORDER — SODIUM CHLORIDE 0.9 % IV BOLUS (SEPSIS)
500.0000 mL | Freq: Once | INTRAVENOUS | Status: AC
  Administered 2016-06-24: 500 mL via INTRAVENOUS

## 2016-06-24 MED ORDER — SODIUM CHLORIDE 0.9 % IV SOLN
INTRAVENOUS | Status: DC
  Administered 2016-06-24 – 2016-06-26 (×4): via INTRAVENOUS

## 2016-06-24 MED ORDER — ORAL CARE MOUTH RINSE
15.0000 mL | Freq: Two times a day (BID) | OROMUCOSAL | Status: DC
  Administered 2016-06-24 – 2016-06-26 (×3): 15 mL via OROMUCOSAL

## 2016-06-24 MED ORDER — SODIUM CHLORIDE 0.9 % IV BOLUS (SEPSIS): 500.0000 mL | Freq: Once | INTRAVENOUS | Status: DC

## 2016-06-24 MED ORDER — OSMOLITE 1.2 CAL PO LIQD: 100.0000 mL/h | Freq: Once | ORAL | Status: DC

## 2016-06-24 MED ORDER — OSMOLITE 1.2 CAL PO LIQD
1000.0000 mL | ORAL | Status: DC
  Administered 2016-06-24 – 2016-06-26 (×3): 1000 mL
  Filled 2016-06-24: qty 1000

## 2016-06-24 NOTE — ED Notes (Signed)
I attempted to collect lab and was unsuccessful 

## 2016-06-24 NOTE — ED Notes (Signed)
Gave report to [facility] RN with [phone].

## 2016-06-24 NOTE — Consult Note (Addendum)
[place] Nurse wound consult note Pt is familiar to [facility] team from recent admission; refer to note on 5/22. Wounds have improved since that time. Reason for Consult: Consult requested for sacrum and bilat ischium chronic wounds.   Pt states she is followed by the outpatient wound care center at [facility] and has been using Hydroferra blue. This topical treatment is not available in the [place]; plan to substitute Aquacel to provide antimicrobial benefits and absorb drainage while she is in the hospital. Wound type: Right ischium with dry scar tissue from a previous wound which has healed, creating a deep valley without an open wound or drainage. 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 There is a pinhole-sized wound in the center remaining; .1X.1X.1cm, pink and dry. Sacrum with chronic stage 4 pressure injury located in a deep crevice and surrounded by pink dry scar tissue, .4X3.5X1cm; bone palpable when probed with a swab, red and moist, mod amt tan drainage, no odor Inner perineum and bilat buttocks with red macerated skin; appearance consistent with moisture associated skin damage; pt states this began when her foley was previously leaking. Pressure Injury POA: Yes Dressing procedure/placement/frequency: Antifungal powder to promote healing to buttocks and perineum. Pt is on an air mattress to decrease pressure.  Aquacel to sacrum as discussed above.  Foam dressing to protect wounds from further injury. Discussed plan of care and pt verbalized understanding. Please re-consult if further assistance is needed.  Thank-you,  Julien Girt MSN, [place], [place], [place], [place]

## 2016-06-24 NOTE — Care Management Note (Signed)
Case Management Note  Patient Details  Name: Angela Page MRN: [id number] Date of Birth: 11-18-1970  Subjective/Objective:                  Gross hematuria, malaise 1. Gross hematuria  - Pt presents with 4 days of gross hematuria and non-specific malaise and fatigue  - H&H noted to be stable, leukocytosis present, afebrile here  - She has recurrent UTI's and this is suspected secondary to hemorrhagic cystitis  - Urology is consulting and much appreciated; will follow-up on recommendations - CBI started in ED with removal of clots; has persistent bloody output  - Plan to treat with abx as below, follow H&H, provide supportive care  2. Recurrent UTI  - Pt has chronic Foley and recurrent UTI's, including MDR acinetobacter recently  - Will treat empirically with meropenem pending culture data   3. Hypotension  - She has been hypotensive in ED and shock was considered, but she is afebrile, well-appearing, and with reassuring lactate; furthermore, she is chronically hypotensive, asymptomatic, and requiring midodrine TID at home  - BUN-to-Cr ratio is 89, there is poor skin turgor, and dehydration is likely contributing  - Plan to give IVF (mindful of diastolic CHF hx), hold diuretic, and continue midodrine   4. Depresssion - Stable, continue duloxetine   5. Chronic pain  - Stable, will continue home regimen with gabapentin, lidocaine patch, APAP, Oxy IR  - Continue bowel regimen   6. Normocytic anemia  - Hgb is 10.9 on admission and stable relative to priors  - Continue B12 supplementation, obtain type & screen and repeat CBC in am given the gross hematuria   7. Spastic quadriplegia - Continue baclofen, tube feeds  - She has sacral pressure ulcers present on admission and wound care consultation is requested    Action/Plan: Date:  June 24, 2016 Chart reviewed for concurrent status and case management needs. Will continue to follow patient progress. Discharge Planning:  following for needs Expected discharge date: [id number] Velva Harman, BSN, [place], [place]  Expected Discharge Date:                  Expected Discharge Plan:  [place]  In-House Referral:     Discharge planning Services  CM Consult  Post Acute Care Choice:    Choice offered to:  NA  DME Arranged:    DME Agency:     HH Arranged:    [place]:  [place]  Status of Service:  In process, will continue to follow  If discussed at Long Length of Stay Meetings, dates discussed:    Additional Comments:  Leeroy Cha, RN 06/24/2016, 8:59 AM

## 2016-06-24 NOTE — Progress Notes (Signed)
PROGRESS NOTE  Angela Page YHC:[id number] DOB: 12/17/1970 DOA: 06/23/2016 PCP: System, Provider Not In   LOS: 1 day   Brief Narrative / Interim history: Angela Page is a 46 y.o. 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  She also has noticed significant weakness in the last day, and tells me that this is consistent to her prior urinary tract infections.  She has no chest pain or shortness of breath.  She has no nausea or vomiting.  She was seen by urology in the emergency room and started on CBI  Assessment & Plan: Principal Problem:   Acute hemorrhagic cystitis Active Problems:   Depression   Quadriplegia, C5-C7 incomplete ([place])   Decubitus ulcer of sacral region, stage 4 ([facility])   Anemia, iron deficiency   Lower urinary tract infectious disease   Hypotension   Chronic diastolic CHF (congestive heart failure) ([place])   Hemorrhagic cystitis in the setting of recurrent MDR urinary tract infections -Patient started on meropenem due to prior history of multidrug-resistant organisms, continue for now, urine cultures are pending -Urology consulted, currently on CBI, appreciate consultation. -Monitor hemoglobin -Her UTIs as likely in the setting of a chronic Foley and quadriplegia  Chronic hypotension -She is asymptomatic with this, she is on Midrin, will continue. -She is a little bit dehydrated, continue IV fluids  Chronic diastolic CHF -Watch respiratory status and fluid balance carefully over the next 24 hours, she is on diuretics at home, may need to be started on 6/6 -Currently she appears  on the dry side  Spastic quadriplegia -Continue baclofen, tube feeds, supportive treatment  Sacral decubitus ulcer -Wound care  consultation  Depression -Stable, continue home medications  Chronic pain -Continue home regimen with gabapentin, lidocaine patch, APAP, OxyIR  Normocytic anemia -Continue B12, monitor CBC in the setting of gross hematuria   DVT prophylaxis: SCD Code Status: Full code Family Communication: no family at bedside, d/w patient Disposition Plan: remain in SDU  Consultants:   Urology   Procedures:   CBI  Antimicrobials:  Meropenem 6/4 >>   Subjective: -Feels better this morning, denies any chest pain, no shortness of breath.  She has no abdominal pain, nausea or vomiting.  She feels like her weakness is improving.  No lightheadedness or dizziness.  Objective: Vitals:   06/24/16 0800 06/24/16 0807 06/24/16 0900 06/24/16 1000  BP:  (!) 80/39 (!) 96/43 (!) 93/51  Pulse:  80 80 80  Resp:  11 12 12   Temp: 97.3 F (36.3 C)   97.1 F (36.2 C)  TempSrc: Oral   Oral  SpO2:  100% 100% 100%  Weight:      Height:        Intake/Output Summary (Last 24 hours) at 06/24/16 1042 Last data filed at 06/24/16 1000  Gross per 24 hour  Intake             7313 ml  Output            10435 ml  Net            -3122 ml   Filed Weights   06/23/16 1314 06/24/16 0200 06/24/16 0346  Weight: 54 kg (119 lb) 59.9 kg (132 lb 0.9 oz) 59.9 kg (132 lb 0.9 oz)    Examination:  Vitals:   06/24/16 0800 06/24/16 5176  06/24/16 0900 06/24/16 1000  BP:  (!) 80/39 (!) 96/43 (!) 93/51  Pulse:  80 80 80  Resp:  11 12 12   Temp: 97.3 F (36.3 C)   97.1 F (36.2 C)  TempSrc: Oral   Oral  SpO2:  100% 100% 100%  Weight:      Height:        Constitutional: NAD Eyes: lids and conjunctivae normal ENMT: Mucous membranes are dry Respiratory: clear to auscultation bilaterally, no wheezing, no crackles. Normal respiratory effort. No accessory muscle use.  Cardiovascular: Regular rate and rhythm, no murmurs / rubs / gallops. No LE edema.  Abdomen: no tenderness. Bowel sounds positive.  Skin: no rashes,  lesions, ulcers. No induration.  Neurologic: Spasticity present, can move upper extremities, no movement in the lower extremities, no sensation below nipple level Psychiatric: Normal judgment and insight. Alert and oriented x 3. Normal mood.    Data Reviewed: I have reviewed following labs and imaging   CBC:  Recent Labs Lab 06/23/16 1500 06/24/16 0256  WBC 16.0* 10.5  NEUTROABS 11.6* 6.8  HGB 10.9* 10.2*  HCT 33.0* 31.0*  MCV 94.0 94.5  PLT 425* 829   Basic Metabolic Panel:  Recent Labs Lab 06/23/16 1500 06/24/16 0256  NA 136 137  K 3.8 3.4*  CL 87* 92*  CO2 34* 35*  GLUCOSE 102* 92  BUN 55* 46*  CREATININE 0.62 0.48  CALCIUM 9.7 9.3   GFR: Estimated Creatinine Clearance: 73.5 mL/min (by C-G formula based on SCr of 0.48 mg/dL). Liver Function Tests:  Recent Labs Lab 06/23/16 1500  AST 38  ALT 35  ALKPHOS 114  BILITOT 0.2*  PROT 7.6  ALBUMIN 3.7   No results for input(s): LIPASE, AMYLASE in the last 168 hours. No results for input(s): AMMONIA in the last 168 hours. Coagulation Profile:  Recent Labs Lab 06/23/16 1530  INR 0.92   Cardiac Enzymes: No results for input(s): CKTOTAL, CKMB, CKMBINDEX, TROPONINI in the last 168 hours. BNP (last 3 results) No results for input(s): PROBNP in the last 8760 hours. HbA1C: No results for input(s): HGBA1C in the last 72 hours. CBG:  Recent Labs Lab 06/24/16 0826  GLUCAP 220*   Lipid Profile: No results for input(s): CHOL, HDL, LDLCALC, TRIG, CHOLHDL, LDLDIRECT in the last 72 hours. Thyroid Function Tests: No results for input(s): TSH, T4TOTAL, FREET4, T3FREE, THYROIDAB in the last 72 hours. Anemia Panel: No results for input(s): VITAMINB12, FOLATE, FERRITIN, TIBC, IRON, RETICCTPCT in the last 72 hours. Urine analysis:    Component Value Date/Time   COLORURINE RED (A) 06/23/2016 1322   APPEARANCEUR TURBID (A) 06/23/2016 1322   LABSPEC  06/23/2016 1322    TEST NOT REPORTED DUE TO COLOR INTERFERENCE OF  URINE PIGMENT   PHURINE  06/23/2016 1322    TEST NOT REPORTED DUE TO COLOR INTERFERENCE OF URINE PIGMENT   GLUCOSEU (A) 06/23/2016 1322    TEST NOT REPORTED DUE TO COLOR INTERFERENCE OF URINE PIGMENT   HGBUR (A) 06/23/2016 1322    TEST NOT REPORTED DUE TO COLOR INTERFERENCE OF URINE PIGMENT   BILIRUBINUR (A) 06/23/2016 1322    TEST NOT REPORTED DUE TO COLOR INTERFERENCE OF URINE PIGMENT   KETONESUR (A) 06/23/2016 1322    TEST NOT REPORTED DUE TO COLOR INTERFERENCE OF URINE PIGMENT   PROTEINUR (A) 06/23/2016 1322    TEST NOT REPORTED DUE TO COLOR INTERFERENCE OF URINE PIGMENT   UROBILINOGEN 0.2 10/29/2014 1521   NITRITE (A) 06/23/2016 1322  TEST NOT REPORTED DUE TO COLOR INTERFERENCE OF URINE PIGMENT   LEUKOCYTESUR (A) 06/23/2016 1322    TEST NOT REPORTED DUE TO COLOR INTERFERENCE OF URINE PIGMENT   Sepsis Labs: Invalid input(s): PROCALCITONIN, LACTICIDVEN  No results found for this or any previous visit (from the past 240 hour(s)).    Radiology Studies: No results found.   Scheduled Meds: . baclofen  20 mg Oral TID  . DULoxetine  30 mg Oral BID  . feeding supplement (OSMOLITE 1.2 CAL)  1,000 mL Per Tube Q24H  . feeding supplement (PRO-STAT SUGAR FREE 64)  30 mL Oral BID  . free water  100 mL Per Tube Q8H  . gabapentin  400 mg Oral Q6H  . midodrine  10 mg Oral TID WC  . sodium chloride flush  3 mL Intravenous Q12H  . temazepam  7.5 mg Oral QHS  . tiotropium  18 mcg Inhalation Daily  . tiZANidine  4 mg Oral TID  . cyanocobalamin  1,000 mcg Per Tube Daily   Continuous Infusions: . sodium chloride 125 mL/hr at 06/24/16 1030  . meropenem (MERREM) IV Stopped (06/24/16 8333)    Marzetta Board, MD, PhD [facility] Pager [phone]  If 7PM-7AM, please contact night-coverage [email or web address] Password [email or web address] 06/24/2016, 10:42 AM

## 2016-06-24 NOTE — Progress Notes (Signed)
Initial Nutrition Assessment  DOCUMENTATION CODES:   Not applicable  INTERVENTION:  - Will change order to Osmolite 1.2 @ 100 mL/hr x10 hours (2300-0900) with 30 mL Prostat BID and 100 mL free water TID. This regimen provides 1400 kcal (93% minimum estimated kcal need), 86 grams of protein, and 1120 mL free water. - RD will continue to monitor for nutrition-related needs/need to adjust TF regimen.   NUTRITION DIAGNOSIS:   Increased nutrient needs related to wound healing as evidenced by estimated needs.  GOAL:   Patient will meet greater than or equal to 90% of their needs  MONITOR:   TF tolerance, Weight trends, Labs, Skin, I & O's  REASON FOR ASSESSMENT:    (New TF)  ASSESSMENT:   46 y.o. 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 Patient is accompanied by her caretaker who assists with the history. She reportedly been in her usual state of health until she noted gross hematuria 4 days ago. She was evaluated for this in the ED and clots were evacuated before she was discharged home. She she reports continued frank blood from the Foley and worsening nonspecific malaise and fatigue.  BMI indicates normal weight; IBW adjusted based on quadriplegia. Pt seen following discussion with RN related to concerns about TF order: Osmolite 1.2 @ 100 mL/hr ordered x24 hours and x7 hours. Reviewed chart and home medications and also talked with pt. She reports that at home she receives Osmolite 1.2 @ 100 mL/hr from 2300-0900, 30 mL Promod BID, and 100 mL free water TID. She states that she has been on this regimen "for awhile." Spoke with Dr. Renne Crigler who states okay for RD to change TF order to align with pt's home regimen. Informed RN of change to TF order.    Physical assessment done to upper body only and shows no muscle and no fat wasting. RN assessment indicates mild edema to BLE. Per chart review, weight has been trending up since December. At [facility] on 04/29/16 pt weighed 110 lbs (49.9 kg). Pt reports that recently she has been 120-130 lbs and feels that current weight (132 lbs, 59.9 kg) should be accurate. Used this weight in estimating needs. Will monitor weight closely given previous increase and current high rate IVF.   Medications reviewed; 100 mcg vitamin B12 per PEG/day. Labs reviewed; CBG: 220 mg/dL this AM, K: 3.4 mmol/L, Cl: 92 mmol/L, BUN: 46 mg/dL.  IVF: NS @ 125 mL/hr.    Diet Order:   NPO  Skin:  Wound (see comment) (Stage 4 sacral and L ischial tuberosity pressure injuries)  Last BM:  PTA/unknown  Height:   Ht Readings from Last 1 Encounters:  06/24/16 5\' 3"  (1.6 m)    Weight:   Wt Readings from Last 1 Encounters:  06/24/16 132 lb 0.9 oz (59.9 kg)    Ideal Body Weight:  47.04 kg  BMI:  Body mass index is 23.39 kg/m.  Estimated Nutritional Needs:   Kcal:  6761-9509 (25-28 kcal/kg)  Protein:  84-96 grams (1.4-1.6 grams/kg)  Fluid:  >/= 1.7 L/day  EDUCATION NEEDS:   No education needs identified at this time    Jarome Matin, MS, RD, LDN, CNSC Inpatient Clinical Dietitian Pager # [phone] After hours/weekend pager # [phone]

## 2016-06-25 DIAGNOSIS — N3001 Acute cystitis with hematuria: Principal | ICD-10-CM

## 2016-06-25 LAB — BASIC METABOLIC PANEL
Anion gap: 4 — ABNORMAL LOW (ref 5–15)
Anion gap: 7 (ref 5–15)
BUN: 19 mg/dL (ref 6–20)
BUN: 17 mg/dL (ref 6–20)
CALCIUM: 8.8 mg/dL — AB (ref 8.9–10.3)
CHLORIDE: 102 mmol/L (ref 101–111)
CO2: 36 mmol/L — AB (ref 22–32)
CO2: 32 mmol/L (ref 22–32)
Calcium: 9 mg/dL (ref 8.9–10.3)
Chloride: 101 mmol/L (ref 101–111)
Creatinine, Ser: <0.3 mg/dL — ABNORMAL LOW (ref 0.44–1.00)
Creatinine, Ser: 0.4 mg/dL — ABNORMAL LOW (ref 0.44–1.00)
GFR calc Af Amer: >60 mL/min (ref >60)
GFR calc non Af Amer: >60 mL/min (ref >60)
GLUCOSE: 119 mg/dL — AB (ref 65–99)
Glucose, Bld: 164 mg/dL — ABNORMAL HIGH (ref 65–99)
POTASSIUM: 4 mmol/L (ref 3.5–5.1)
POTASSIUM: 4.4 mmol/L (ref 3.5–5.1)
Sodium: 141 mmol/L (ref 135–145)
Sodium: 141 mmol/L (ref 135–145)

## 2016-06-25 LAB — CBC
HCT: 24.4 % — ABNORMAL LOW (ref 36.0–46.0)
HCT: 31.2 % — ABNORMAL LOW (ref 36.0–46.0)
HEMOGLOBIN: 9.9 g/dL — AB (ref 12.0–15.0)
Hemoglobin: 7.9 g/dL — ABNORMAL LOW (ref 12.0–15.0)
MCH: 31.9 pg (ref 26.0–34.0)
MCH: 31.1 pg (ref 26.0–34.0)
MCHC: 32.4 g/dL (ref 30.0–36.0)
MCHC: 31.7 g/dL (ref 30.0–36.0)
MCV: 98.4 fL (ref 78.0–100.0)
MCV: 98.1 fL (ref 78.0–100.0)
PLATELETS: 245 10*3/uL (ref 150–400)
PLATELETS: 315 10*3/uL (ref 150–400)
RBC: 2.48 MIL/uL — ABNORMAL LOW (ref 3.87–5.11)
RBC: 3.18 MIL/uL — AB (ref 3.87–5.11)
RDW: 13.4 % (ref 11.5–15.5)
RDW: 13.4 % (ref 11.5–15.5)
WBC: 7.5 10*3/uL (ref 4.0–10.5)
WBC: 12 10*3/uL — ABNORMAL HIGH (ref 4.0–10.5)

## 2016-06-25 LAB — GLUCOSE, CAPILLARY
GLUCOSE-CAPILLARY: 168 mg/dL — AB (ref 65–99)
GLUCOSE-CAPILLARY: 111 mg/dL — AB (ref 65–99)
Glucose-Capillary: 156 mg/dL — ABNORMAL HIGH (ref 65–99)

## 2016-06-25 MED ORDER — SODIUM CHLORIDE 0.9 % IV BOLUS (SEPSIS)
500.0000 mL | Freq: Once | INTRAVENOUS | Status: AC
  Administered 2016-06-25: 500 mL via INTRAVENOUS

## 2016-06-25 MED ORDER — HYDRALAZINE HCL 20 MG/ML IJ SOLN
5.0000 mg | INTRAMUSCULAR | Status: DC | PRN
  Filled 2016-06-25: qty 0.25

## 2016-06-25 MED ORDER — LORAZEPAM 0.5 MG PO TABS
0.5000 mg | ORAL_TABLET | Freq: Four times a day (QID) | ORAL | Status: DC | PRN
  Administered 2016-06-25 – 2016-06-27 (×4): 0.5 mg via ORAL
  Filled 2016-06-25 (×4): qty 1

## 2016-06-25 MED ORDER — MIDODRINE HCL 5 MG PO TABS
10.0000 mg | ORAL_TABLET | Freq: Three times a day (TID) | ORAL | Status: DC
  Administered 2016-06-25 – 2016-06-27 (×6): 10 mg via ORAL
  Filled 2016-06-25 (×7): qty 2

## 2016-06-25 MED ORDER — TEMAZEPAM 7.5 MG PO CAPS: 7.5000 mg | ORAL_CAPSULE | Freq: Every evening | ORAL | Status: DC | PRN

## 2016-06-25 NOTE — Progress Notes (Addendum)
PROGRESS NOTE  Marjani Kobel PZW:[id number] DOB: 03/10/1970 DOA: 06/23/2016   PCP: System, Provider Not In   LOS: 2 days   Brief Narrative / Interim history: Pt is 46 yo female with known spastic quadriplegia, feeding tube dependent, chronic foley catheter, stage IV sacral decubitus ulcer, chronic hypotension, presented with gross hematuria.   Assessment and plan: Principal Problem:   Acute hemorrhagic cystitis in pt with recurrent MDR UTI, acute blood loss anemia  - was started on Meropenem due to known hx of MDR - urine cultures pending  - pt still reports ongoing bleeding - Hg did drop overnight: 10.2 --> 7.9, will repeat CBC today  - may been blood transfusion if Hg continues to drop - WBC now WNL - CBC in AM  Active Problems:   Hypertensive urgency - in pt who is typically hypotensive - BP now up to 200's and suspect provoked by CBI - hold midodrine until BP stabilizes  - keep in SDU    Abd distension - bladder scan with over 700's cc, RN to discuss with urologist - monitor closely     Depression - appears to be stable at this time     Quadriplegia, C5-C7 incomplete ([place])   Decubitus ulcer of sacral region, stage 4 ([place]) - wound care consulted     Chronic diastolic CHF (congestive heart failure) ([place]) - respiratory status stable   DVT prophylaxis: SCD's Code Status: Full  Family Communication: pt at bedside  Disposition Plan: keep in SDU   Consultants:   Urology   Procedures:   CBI  Antimicrobials:  Meropenem 6/4 >>   Subjective: Pt was doing well in am but when CBI done, became hypertensive and anxious.   Objective: Vitals:   06/25/16 1100 06/25/16 1145 06/25/16 1200 06/25/16 1203  BP: (!) 167/80  (!) 170/78   Pulse: 84  84 86  Resp: 19  19 15   Temp:  99.2 F (37.3 C)    TempSrc:  Oral    SpO2: 99%  100% 99%  Weight:      Height:        Intake/Output Summary (Last 24 hours) at 06/25/16 1354 Last data filed at 06/25/16 1200  Gross  per 24 hour  Intake            11050 ml  Output            14950 ml  Net            -3900 ml   Filed Weights   06/24/16 0200 06/24/16 0346 06/25/16 0401  Weight: 59.9 kg (132 lb 0.9 oz) 59.9 kg (132 lb 0.9 oz) 58.4 kg (128 lb 12 oz)    Examination:  Vitals:   06/25/16 1100 06/25/16 1145 06/25/16 1200 06/25/16 1203  BP: (!) 167/80  (!) 170/78   Pulse: 84  84 86  Resp: 19  19 15   Temp:  99.2 F (37.3 C)    TempSrc:  Oral    SpO2: 99%  100% 99%  Weight:      Height:       Physical Exam  Constitutional: Appears well-developed and well-nourished. No distress.  CVS: RRR, S1/S2 +, no murmurs, no gallops, no carotid bruit.  Pulmonary: Effort and breath sounds normal, no stridor Abdominal: Soft. BS +,  no tenderness, rebound or guarding.  Musculoskeletal: No edema and no tenderness.  Lymphadenopathy: No lymphadenopathy noted, cervical, inguinal. Psychiatric: Normal mood and affect. Behavior, judgment, thought content normal.   Data Reviewed: I  have reviewed following labs and imaging   CBC:  Recent Labs Lab 06/23/16 1500 06/24/16 0256 06/25/16 0357  WBC 16.0* 10.5 7.5  NEUTROABS 11.6* 6.8  --   HGB 10.9* 10.2* 7.9*  HCT 33.0* 31.0* 24.4*  MCV 94.0 94.5 98.4  PLT 425* 340 017   Basic Metabolic Panel:  Recent Labs Lab 06/23/16 1500 06/24/16 0256 06/25/16 0357  NA 136 137 141  K 3.8 3.4* 4.0  CL 87* 92* 101  CO2 34* 35* 36*  GLUCOSE 102* 92 164*  BUN 55* 46* 19  CREATININE 0.62 0.48 <0.30*  CALCIUM 9.7 9.3 8.8*   Liver Function Tests:  Recent Labs Lab 06/23/16 1500  AST 38  ALT 35  ALKPHOS 114  BILITOT 0.2*  PROT 7.6  ALBUMIN 3.7   Coagulation Profile:  Recent Labs Lab 06/23/16 1530  INR 0.92   CBG:  Recent Labs Lab 06/24/16 1937 06/24/16 2331 06/25/16 0343 06/25/16 0731 06/25/16 1118  GLUCAP 102* 121* 156* 168* 111*   Urine analysis:    Component Value Date/Time   COLORURINE RED (A) 06/23/2016 1322   APPEARANCEUR TURBID (A)  06/23/2016 1322   LABSPEC  06/23/2016 1322    TEST NOT REPORTED DUE TO COLOR INTERFERENCE OF URINE PIGMENT   PHURINE  06/23/2016 1322    TEST NOT REPORTED DUE TO COLOR INTERFERENCE OF URINE PIGMENT   GLUCOSEU (A) 06/23/2016 1322    TEST NOT REPORTED DUE TO COLOR INTERFERENCE OF URINE PIGMENT   HGBUR (A) 06/23/2016 1322    TEST NOT REPORTED DUE TO COLOR INTERFERENCE OF URINE PIGMENT   BILIRUBINUR (A) 06/23/2016 1322    TEST NOT REPORTED DUE TO COLOR INTERFERENCE OF URINE PIGMENT   KETONESUR (A) 06/23/2016 1322    TEST NOT REPORTED DUE TO COLOR INTERFERENCE OF URINE PIGMENT   PROTEINUR (A) 06/23/2016 1322    TEST NOT REPORTED DUE TO COLOR INTERFERENCE OF URINE PIGMENT   UROBILINOGEN 0.2 10/29/2014 1521   NITRITE (A) 06/23/2016 1322    TEST NOT REPORTED DUE TO COLOR INTERFERENCE OF URINE PIGMENT   LEUKOCYTESUR (A) 06/23/2016 1322    TEST NOT REPORTED DUE TO COLOR INTERFERENCE OF URINE PIGMENT   Sepsis Labs: Invalid input(s): PROCALCITONIN, LACTICIDVEN  Recent Results (from the past 240 hour(s))  Urine culture     Status: Abnormal (Preliminary result)   Collection Time: 06/23/16  1:55 PM  Result Value Ref Range Status   Specimen Description URINE, CLEAN CATCH  Final   Special Requests Normal  Final   Culture >=100,000 COLONIES/mL PSEUDOMONAS AERUGINOSA (A)  Final   Report Status PENDING  Incomplete      Radiology Studies: No results found.   Scheduled Meds: . baclofen  20 mg Oral TID  . chlorhexidine  15 mL Mouth Rinse BID  . DULoxetine  30 mg Oral BID  . feeding supplement (OSMOLITE 1.2 CAL)  1,000 mL Per Tube Q24H  . feeding supplement (PRO-STAT SUGAR FREE 64)  30 mL Oral BID  . free water  100 mL Per Tube Q8H  . gabapentin  400 mg Oral Q6H  . mouth rinse  15 mL Mouth Rinse q12n4p  . sodium chloride flush  3 mL Intravenous Q12H  . temazepam  7.5 mg Oral QHS  . tiotropium  18 mcg Inhalation Daily  . tiZANidine  4 mg Oral TID  . cyanocobalamin  1,000 mcg Per Tube Daily    Continuous Infusions: . sodium chloride 125 mL/hr at 06/25/16 1001  . meropenem (MERREM)  IV Stopped (06/25/16 0700)  . sodium chloride      Faye Ramsay, MD  [facility] Pager [phone]  If 7PM-7AM, please contact night-coverage [email or web address] Password [email or web address] 06/25/2016, 1:54 PM

## 2016-06-25 NOTE — Progress Notes (Signed)
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 When I entered the room, the nursing staff had irrigated the catheter with resolution of symptoms an return to normal vitals. Aside from this, she has been recovering fairly well and gross hematuria has been improving.   Objective: Vital signs in last 24 hours: Temp:  [97.8 F (36.6 C)-99.4 F (37.4 C)] 99.2 F (37.3 C) (06/06 1145) Pulse Rate:  [76-90] 86 (06/06 1203) Resp:  [11-20] 15 (06/06 1203) BP: (68-170)/(37-81) 170/78 (06/06 1200) SpO2:  [89 %-100 %] 99 % (06/06 1203) Weight:  [58.4 kg (128 lb 12 oz)] 58.4 kg (128 lb 12 oz) (06/06 0401)  Intake/Output from previous day: 06/05 0701 - 06/06 0700 In: 8615.5 [I.V.:2565.5; NG/GT:1600; IV Piggyback:450] Out: 35573 [Urine:5900; Drains:4635] Intake/Output this shift: Total I/O In: 3325 [I.V.:125; Other:3100; NG/GT:100] Out: 5750 [Urine:2000; Drains:3750]  Physical Exam:  General: Alert and oriented. CV: RRR Lungs: Clear bilaterally. GI: Soft, Nondistended. 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 Recently admitted for pneumonia and UTI.  She presents with progressive gross hematuria and fevers consistent with hemorrhagic cystitis. Her urine culture returned with pseudomonas.   She had an acute episode of symptoms consistent with autonomic dysreflexia. I gently irrigated her catheter and did not irrigate any clots. The nursing staff had also done so and a small clo had passed before I arrived. When I  left the patient was comfortable and vitals back to baseline.   Recommendations: 1. Continue foley catheter in place with CBI as needed. Was left at a slow drip with output pink urine and no clots. As urine clears, would titrate down and discontinue irrigation, as irrigation itself could contribute to the symptoms she experienced. 2. Antibiotics and supportive care per primary team.  3. Will need follow-up electively with [name] as outpatient to discuss screening and management of neurogenic bladder.    LOS: 2 days   LOMBOY, JASON R 06/25/2016, 2:08 PM   I came when BP up--irrigated bladder. Several small clots evacuated, 500 cc urine in bladder. BP elevation resolved. I'll leave CBI running slowly--hand irrigate as well.

## 2016-06-25 NOTE — Progress Notes (Signed)
Patient had sudden onset of elevated BP, headache, sweating and anxiety. Abdomen swollen. CBI immediately turned off and bladder scan performed. 500-700 mLs on bladder scan. Foley flushed an manipulated with no issues urine flowing. Urology and [facility] paged. Spoke with both and continued to attempt to clear foley. Foley passed small clot and drainage picked up speed. Symptoms resolved with flushing of catheter.  Urology resident evaluated and decreased CBI rate. Will continue to monitor closely.

## 2016-06-26 ENCOUNTER — Telehealth: Payer: Self-pay | Admitting: Physician Assistant

## 2016-06-26 LAB — CBC
HCT: 25.3 % — ABNORMAL LOW (ref 36.0–46.0)
HEMOGLOBIN: 8.2 g/dL — AB (ref 12.0–15.0)
MCH: 31.2 pg (ref 26.0–34.0)
MCHC: 32.4 g/dL (ref 30.0–36.0)
MCV: 96.2 fL (ref 78.0–100.0)
Platelets: 257 10*3/uL (ref 150–400)
RBC: 2.63 MIL/uL — ABNORMAL LOW (ref 3.87–5.11)
RDW: 13.6 % (ref 11.5–15.5)
WBC: 6.3 10*3/uL (ref 4.0–10.5)

## 2016-06-26 LAB — BASIC METABOLIC PANEL
Anion gap: 4 — ABNORMAL LOW (ref 5–15)
BUN: 14 mg/dL (ref 6–20)
CHLORIDE: 104 mmol/L (ref 101–111)
CO2: 33 mmol/L — ABNORMAL HIGH (ref 22–32)
Calcium: 8.9 mg/dL (ref 8.9–10.3)
Creatinine, Ser: <0.3 mg/dL — ABNORMAL LOW (ref 0.44–1.00)
Glucose, Bld: 157 mg/dL — ABNORMAL HIGH (ref 65–99)
POTASSIUM: 4.1 mmol/L (ref 3.5–5.1)
SODIUM: 141 mmol/L (ref 135–145)

## 2016-06-26 LAB — GLUCOSE, CAPILLARY
GLUCOSE-CAPILLARY: 135 mg/dL — AB (ref 65–99)
GLUCOSE-CAPILLARY: 164 mg/dL — AB (ref 65–99)
Glucose-Capillary: 121 mg/dL — ABNORMAL HIGH (ref 65–99)
Glucose-Capillary: 119 mg/dL — ABNORMAL HIGH (ref 65–99)

## 2016-06-26 LAB — URINE CULTURE
Culture: >=100000 — AB
Special Requests: NORMAL

## 2016-06-26 MED ORDER — DEXTROSE 5 % IV SOLN
2.0000 g | Freq: Two times a day (BID) | INTRAVENOUS | Status: DC
  Administered 2016-06-26 – 2016-06-27 (×3): 2 g via INTRAVENOUS
  Filled 2016-06-26 (×3): qty 2

## 2016-06-26 MED ORDER — PRO-STAT SUGAR FREE PO LIQD
30.0000 mL | Freq: Two times a day (BID) | ORAL | Status: DC
  Administered 2016-06-26: 30 mL
  Filled 2016-06-26 (×2): qty 30

## 2016-06-26 NOTE — Telephone Encounter (Signed)
Returned moms, [place] on file, and she has been made aware to bring all the information re:pts pacemaker with her to her appt with Dr.Klein 07/02/16.  She verbalized understanding.

## 2016-06-26 NOTE — Progress Notes (Signed)
Nutrition Follow-up  DOCUMENTATION CODES:   Not applicable  INTERVENTION:  - Continue Osmolite 1.2 @ 100 mL/hr x10 hours (2300-0900) with 30 mL Prostat BID and 100 mL free water TID.  - RD will continue to monitor for need to adjust TF regimen.   NUTRITION DIAGNOSIS:   Increased nutrient needs related to wound healing as evidenced by estimated needs. -ongoing  GOAL:   Patient will meet greater than or equal to 90% of their needs -met with current TF regimen.   MONITOR:   TF tolerance, Weight trends, Labs, Skin, I & O's  ASSESSMENT:   46 y.o. 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 Patient is accompanied by her caretaker who assists with the history. She reportedly been in her usual state of health until she noted gross hematuria 4 days ago. She was evaluated for this in the ED and clots were evacuated before she was discharged home. She she reports continued frank blood from the Foley and worsening nonspecific malaise and fatigue.  6/7 Pt continues with Osmolite 1.2 @ 100 mL/hr x10 hours (2300-0900) with 30 mL Prostat BID and 100 mL free water TID. This regimen is providing 1400 kcal (93% minimum estimated kcal need), 86 grams of protein, and 1120 mL free water. Noted that IVF has decreased from 125 mL/hr to 50 mL/hr since previous assessment. Current weight is +17 lbs/8kg since admission. Will continue to monitor weight trends closely.  Medications reviewed; 1000 mcg vitamin B12 per PEG/day. Labs reviewed; CBGs: 164 and 121 mg/dL today, creatinine: <0.3 mg/dL.   IVF: NS @ 50 mL/hr.    6/5 - IBW adjusted based on quadriplegia.  - At home she receives Osmolite 1.2 @ 100 mL/hr from 2300-0900, 30 mL Promod BID, and 100 mL free water TID.   - She states that she has been on this regimen "for awhile."  - Physical assessment done to upper body only and shows no muscle and no fat wasting.  - RN assessment indicates mild edema to BLE.  - Per chart review, weight has been trending up since December.  - At [facility] on 04/29/16 pt weighed 110 lbs (49.9 kg).  - Pt reports that recently she has been 120-130 lbs and feels that current weight (132 lbs, 59.9 kg) should be accurate. - Used this weight in estimating needs.   IVF: NS @ 125 mL/hr.     Diet Order:   NPO  Skin:  Wound (see comment) (Stage 4 sacral and L ischial tuberosity pressure injuries)  Last BM:  PTA/unknown  Height:   Ht Readings from Last 1 Encounters:  06/24/16 '5\' 3"'$  (1.6 m)    Weight:   Wt Readings from Last 1 Encounters:  06/26/16 149 lb 11.1 oz (67.9 kg)    Ideal Body Weight:  47.04 kg  BMI:  Body mass index is 26.52 kg/m.  Estimated Nutritional Needs:   Kcal:  3151-7616 (25-28 kcal/kg)  Protein:  84-96 grams (1.4-1.6 grams/kg)  Fluid:  >/= 1.7 L/day  EDUCATION NEEDS:   No education needs identified at this time    Jarome Matin, MS, RD, LDN, CNSC Inpatient Clinical Dietitian Pager # [phone] After hours/weekend pager # [phone]

## 2016-06-26 NOTE — Progress Notes (Addendum)
PROGRESS NOTE  Angela Page ZOX:[id number] DOB: [birth year] DOA: 06/23/2016   PCP: System, Provider Not In   LOS: 3 days   Brief Narrative / Interim history: Pt is 46 yo female with known spastic quadriplegia, feeding tube dependent, chronic foley catheter, stage IV sacral decubitus ulcer, chronic hypotension, presented with gross hematuria.   Assessment and plan: Principal Problem:   Acute hemorrhagic cystitis in pt with recurrent MDR UTI, acute blood loss anemia  - continue Meropenem given hx of MDR, today is day #3 - urine culture with Pseudomonas and sensitive to Meropenem, after discussion with pharmacy, narrow down to Cefepime  - Hg up from 7.9 --> 8.2 (there was one blood work in the mid day yesterday with Hg > 9 but suspect this is blood work error) - WBC is WNL - pt stable for transfer to tele bed   Active Problems:   Hypertensive urgency - suspect this was due to clot in foley, once removed, BP has stabilized - BP remains stable     Abd distension - bladder scan with over 700's cc on 6/6, foley irrigated - now resolved     Depression - appears to be stable at this time     Quadriplegia, C5-C7 incomplete ([place])   Decubitus ulcer of sacral region, stage 4 ([facility]) - wound in sacral and bilateral ischium areas, chronic wounds - right ischium - with healed scar - left ischium - chronic stage IV pressure injury, moderate drainage but no odor  - keep on air mattress, aquacel to left ischium and sacrum, foam dressing to protect wounds     Chronic diastolic CHF (congestive heart failure) ([place]) - stable respiratory status   DVT prophylaxis: SCD's Code Status: Full  Family Communication: pt updated at bedside, RN present as well  Disposition Plan: transfer to tele   Consultants:   Urology   Procedures:   CBI  Antimicrobials:  Meropenem 6/5 >> 6/7  Cefepime 6/7 >>  Subjective: Pt reports feeling better this AM.   Objective: Vitals:   06/26/16 0700 06/26/16  0800 06/26/16 0900 06/26/16 0949  BP: (!) 92/42 (!) 95/38 133/68   Pulse: 80 83 80 88  Resp: 20 19 18 14   Temp:  98.5 F (36.9 C)    TempSrc:  Oral    SpO2: 100% 100% 100% 100%  Weight:      Height:        Intake/Output Summary (Last 24 hours) at 06/26/16 1026 Last data filed at 06/26/16 0800  Gross per 24 hour  Intake          7328.34 ml  Output            10150 ml  Net         -2821.66 ml   Filed Weights   06/24/16 0346 06/25/16 0401 06/26/16 0619  Weight: 59.9 kg (132 lb 0.9 oz) 58.4 kg (128 lb 12 oz) 67.9 kg (149 lb 11.1 oz)    Examination:  Vitals:   06/26/16 0700 06/26/16 0800 06/26/16 0900 06/26/16 0949  BP: (!) 92/42 (!) 95/38 133/68   Pulse: 80 83 80 88  Resp: 20 19 18 14   Temp:  98.5 F (36.9 C)    TempSrc:  Oral    SpO2: 100% 100% 100% 100%  Weight:      Height:       Physical Exam  Constitutional: Appears well-developed and well-nourished. No distress.  CVS: RRR, S1/S2 +, no murmurs, no gallops, no carotid bruit.  Pulmonary:  Effort and breath sounds normal, no stridor, rhonchi, wheezes, rales.  Abdominal: Soft. BS +,  no distension, tenderness, rebound or guarding.  Musculoskeletal: Normal range of motion. No edema and no tenderness.   Data Reviewed: I have reviewed following labs and imaging   CBC:  Recent Labs Lab 06/23/16 1500 06/24/16 0256 06/25/16 0357 06/25/16 1441 06/26/16 0314  WBC 16.0* 10.5 7.5 12.0* 6.3  NEUTROABS 11.6* 6.8  --   --   --   HGB 10.9* 10.2* 7.9* 9.9* 8.2*  HCT 33.0* 31.0* 24.4* 31.2* 25.3*  MCV 94.0 94.5 98.4 98.1 96.2  PLT 425* 340 245 315 962   Basic Metabolic Panel:  Recent Labs Lab 06/23/16 1500 06/24/16 0256 06/25/16 0357 06/25/16 1441 06/26/16 0314  NA 136 137 141 141 141  K 3.8 3.4* 4.0 4.4 4.1  CL 87* 92* 101 102 104  CO2 34* 35* 36* 32 33*  GLUCOSE 102* 92 164* 119* 157*  BUN 55* 46* 19 17 14   CREATININE 0.62 0.48 <0.30* 0.40* <0.30*  CALCIUM 9.7 9.3 8.8* 9.0 8.9   Liver Function  Tests:  Recent Labs Lab 06/23/16 1500  AST 38  ALT 35  ALKPHOS 114  BILITOT 0.2*  PROT 7.6  ALBUMIN 3.7   Coagulation Profile:  Recent Labs Lab 06/23/16 1530  INR 0.92   CBG:  Recent Labs Lab 06/25/16 0343 06/25/16 0731 06/25/16 1118 06/25/16 1511 06/26/16 0745  GLUCAP 156* 168* 111* 135* 164*   Urine analysis:    Component Value Date/Time   COLORURINE RED (A) 06/23/2016 1322   APPEARANCEUR TURBID (A) 06/23/2016 1322   LABSPEC  06/23/2016 1322    TEST NOT REPORTED DUE TO COLOR INTERFERENCE OF URINE PIGMENT   PHURINE  06/23/2016 1322    TEST NOT REPORTED DUE TO COLOR INTERFERENCE OF URINE PIGMENT   GLUCOSEU (A) 06/23/2016 1322    TEST NOT REPORTED DUE TO COLOR INTERFERENCE OF URINE PIGMENT   HGBUR (A) 06/23/2016 1322    TEST NOT REPORTED DUE TO COLOR INTERFERENCE OF URINE PIGMENT   BILIRUBINUR (A) 06/23/2016 1322    TEST NOT REPORTED DUE TO COLOR INTERFERENCE OF URINE PIGMENT   KETONESUR (A) 06/23/2016 1322    TEST NOT REPORTED DUE TO COLOR INTERFERENCE OF URINE PIGMENT   PROTEINUR (A) 06/23/2016 1322    TEST NOT REPORTED DUE TO COLOR INTERFERENCE OF URINE PIGMENT   UROBILINOGEN 0.2 10/29/2014 1521   NITRITE (A) 06/23/2016 1322    TEST NOT REPORTED DUE TO COLOR INTERFERENCE OF URINE PIGMENT   LEUKOCYTESUR (A) 06/23/2016 1322    TEST NOT REPORTED DUE TO COLOR INTERFERENCE OF URINE PIGMENT    Recent Results (from the past 240 hour(s))  Urine culture     Status: Abnormal   Collection Time: 06/23/16  1:55 PM  Result Value Ref Range Status   Specimen Description URINE, CLEAN CATCH  Final   Special Requests Normal  Final   Culture >=100,000 COLONIES/mL PSEUDOMONAS AERUGINOSA (A)  Final   Report Status 06/26/2016 FINAL  Final   Organism ID, Bacteria PSEUDOMONAS AERUGINOSA (A)  Final      Susceptibility   Pseudomonas aeruginosa - MIC*    CEFTAZIDIME 2 SENSITIVE Sensitive     CIPROFLOXACIN >=4 RESISTANT Resistant     GENTAMICIN >=16 RESISTANT Resistant      IMIPENEM <=0.25 SENSITIVE Sensitive     PIP/TAZO <=4 SENSITIVE Sensitive     CEFEPIME 2 SENSITIVE Sensitive     * >=100,000 COLONIES/mL PSEUDOMONAS AERUGINOSA  Radiology Studies: No results found.  Scheduled Meds: . baclofen  20 mg Oral TID  . chlorhexidine  15 mL Mouth Rinse BID  . DULoxetine  30 mg Oral BID  . feeding supplement (OSMOLITE 1.2 CAL)  1,000 mL Per Tube Q24H  . feeding supplement (PRO-STAT SUGAR FREE 64)  30 mL Oral BID  . free water  100 mL Per Tube Q8H  . gabapentin  400 mg Oral Q6H  . mouth rinse  15 mL Mouth Rinse q12n4p  . midodrine  10 mg Oral TID WC  . sodium chloride flush  3 mL Intravenous Q12H  . temazepam  7.5 mg Oral QHS  . tiotropium  18 mcg Inhalation Daily  . tiZANidine  4 mg Oral TID  . cyanocobalamin  1,000 mcg Per Tube Daily   Continuous Infusions: . sodium chloride 75 mL/hr at 06/26/16 0600  . meropenem (MERREM) IV Stopped (06/26/16 0559)  . sodium chloride     Time spent: 30 minutes   Faye Ramsay, MD  [facility] Pager [phone]  If 7PM-7AM, please contact night-coverage [email or web address] Password [email or web address] 06/26/2016, 10:26 AM

## 2016-06-26 NOTE — Telephone Encounter (Signed)
New message     Patient mother said she was to call to give information for pacemaker

## 2016-06-27 LAB — CBC
HCT: 23.1 % — ABNORMAL LOW (ref 36.0–46.0)
Hemoglobin: 7.4 g/dL — ABNORMAL LOW (ref 12.0–15.0)
MCH: 31.4 pg (ref 26.0–34.0)
MCHC: 32 g/dL (ref 30.0–36.0)
MCV: 97.9 fL (ref 78.0–100.0)
PLATELETS: 273 10*3/uL (ref 150–400)
RBC: 2.36 MIL/uL — ABNORMAL LOW (ref 3.87–5.11)
RDW: 13.7 % (ref 11.5–15.5)
WBC: 5.5 10*3/uL (ref 4.0–10.5)

## 2016-06-27 LAB — BASIC METABOLIC PANEL
ANION GAP: 6 (ref 5–15)
BUN: 16 mg/dL (ref 6–20)
CALCIUM: 9.2 mg/dL (ref 8.9–10.3)
CO2: 34 mmol/L — ABNORMAL HIGH (ref 22–32)
Chloride: 102 mmol/L (ref 101–111)
GLUCOSE: 145 mg/dL — AB (ref 65–99)
Potassium: 4.1 mmol/L (ref 3.5–5.1)
Sodium: 142 mmol/L (ref 135–145)

## 2016-06-27 LAB — GLUCOSE, CAPILLARY: Glucose-Capillary: 174 mg/dL — ABNORMAL HIGH (ref 65–99)

## 2016-06-27 MED ORDER — ZOLPIDEM TARTRATE 5 MG PO TABS: 5.0000 mg | ORAL_TABLET | Freq: Every evening | ORAL | 2 refills | Status: DC | PRN

## 2016-06-27 NOTE — Discharge Summary (Signed)
[facility] Discharge Summary  Patient name: Angela Page Medical record number: [id number] Date of birth: 08/22/1970 Age: 46 y.o. Gender: female Date of Admission: 06/09/2016  Date of Discharge: 06/13/2016 Admitting Physician: Angela Dawn, MD  Primary Care Provider: System, Provider Not In Consultants: None   Indication for Hospitalization:  Shortness of breath LE swelling  Quadriplegia Chronic pain GERD Tracheostomy Stage III-IV sacral decubitus ulcers Tobacco abuse  Discharge Diagnoses/Problem List:  HCAP Quadriplegia  Disposition: Home   Discharge Condition: Stable, improved   Discharge Exam:  General: pleasant 46 yo F lying in bed, NAD  Cardiovascular: RRR, 3/6 holosystolic murmur noted  Respiratory: CTAB, comfortable WOB on 3L  Gastrointestinal: distended, nontender, +BS MSK: 1+ pitting edema to mid calf  Derm: warm, dry  Neuro: AOx4, no sensation in LE bilaterally, does not move LE, 1/5 grip strength b/l  Psych: normal affect   Brief Hospital Course:  Angela Page is a 46 y.o. female presenting with shortness of breath and LE swelling. 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   Shortness of breath/Lower extremity edema On arrival patient meeting criteria for SIRS with elevated white count to 17.4 and febrile to 100.4. Patient with a hx of UTIs and urine with nitrites/LE two weeks ago, though from indwelling catheter. Repeat UA in the ED without nitrites.  Endorsing worsening cough with CXR noting vascular congestion, small right effusion, possibly the start of small right consolidation.  Likely source suspected to be HCAP vs aspiration pneumonia and she was started on Vancomycin and Meropenem (due to penicillin allergy).  On exam, patient with  obvious signs of fluid overload with 1+ pitting edema to the knees bilaterally and tight appearing skin overlying her lower legs.  Also with bibasilar crackles and elevated BNP of 150 concerning for CHF exacerbation.  CTA ruled out PE and US doppler of LE ruled out DVT.  ACS ruled out with negative troponins and EKG without signs of ischemia.  She had noted worsening edema over the past month and Lasix was dosed. Compression stockings were also applied bilaterally once clot had been ruled out. Her blood pressures generally run low per chart review so diuretics were used cautiously.  Of note, she is on Midodrine for her hypotension at home.  She was continued on IV Vancomycin and Meropenem and once appearing better was transitioned to po Levaquin and Clindamycin.  Blood cultures remained negative and Strep pneumo/Legionella antigen negative.  Respiratory status improved with antibiotics and lower extremity edema improved with Lasix.  She was stable for discharge home on 5/25 with follow up scheduled.    Mild transaminitis.  Patient noted to have AST of 75 and ALT 77.  Previously noted elevations in the past year,  Hepatitis panel was negative.  With normal PT and INR.  Abdominal ultrasound also obtained which was revealed normal appearance of liver and biliary tree.  May benefit from repeat labs as outpatient to ensure this has resolved.   Quadriplegia 2/2 traumatic spinal injury  Home chronic pain medications were continued.  These included Oxycodone, Baclofen, Gabapentin and Zanaflex.   Additionally, her home tube feeding regimen of 10 hours/day was continued.   Issues for Follow Up:  1. Patient noted to have significant pitting edema of bilateral lower extremities and Lasix was dosed however cautiously due to her lower blood  pressures.  Was discharged on 40 mg daily po Lasix. Titrate as needed.  2. Follow up blood pressure 3. Discharged home on po Levaquin and Clindamycin for total of 3 days.  Please  ensure she has taken these.  4. Patient with mild transaminitis.  May want to repeat LFTs to follow outpatient.  5. Patient with indwelling catheter.  Last Foley change prior to this hospitalization was 5/1.  Her foley was replaced 5/23 to minimize sources of infection.   Significant Procedures: None   Significant Labs and Imaging:   Recent Labs Lab 06/25/16 1441 06/26/16 0314 06/27/16 0449  WBC 12.0* 6.3 5.5  HGB 9.9* 8.2* 7.4*  HCT 31.2* 25.3* 23.1*  PLT 315 257 273    Recent Labs Lab 06/23/16 1500 06/24/16 0256 06/25/16 0357 06/25/16 1441 06/26/16 0314 06/27/16 0449  NA 136 137 141 141 141 142  K 3.8 3.4* 4.0 4.4 4.1 4.1  CL 87* 92* 101 102 104 102  CO2 34* 35* 36* 32 33* 34*  GLUCOSE 102* 92 164* 119* 157* 145*  BUN 55* 46* 19 17 14 16   CREATININE 0.62 0.48 <0.30* 0.40* <0.30* <0.30*  CALCIUM 9.7 9.3 8.8* 9.0 8.9 9.2  ALKPHOS 114  --   --   --   --   --   AST 38  --   --   --   --   --   ALT 35  --   --   --   --   --   ALBUMIN 3.7  --   --   --   --   --    CTA 5/21 - No evidence of PE  US abdomen 5/22 - normal appearance of liver and biliary tree. 9 mm right renal calculus.  CXR 5/21 - Progression of vascular congestion and small right effusion most likely due to fluid overload. Progression of right lower lobe atelectasis. Echo 5/22 - EF 55-60%   Results/Tests Pending at Time of Discharge: None   Discharge Medications:  Allergies as of 06/13/2016      Reactions   Erythromycin Nausea And Vomiting   Nitrofurantoin Monohyd Macro Nausea And Vomiting   Penicillins Hives   Has patient had a PCN reaction causing immediate rash, facial/tongue/throat swelling, SOB or lightheadedness with hypotension: Yes Has patient had a PCN reaction causing severe rash involving mucus membranes or skin necrosis: Yes Has patient had a PCN reaction that required hospitalization Yes- went to the DR Has patient had a PCN reaction occurring within the last 10 years: No-childhood  allergy If all of the above answers are "NO", then may proceed with Cephalosporin use.      Medication List    STOP taking these medications   clonazePAM 0.5 MG disintegrating tablet Commonly known as:  KLONOPIN     TAKE these medications   albuterol (2.5 MG/3ML) 0.083% nebulizer solution Commonly known as:  PROVENTIL Take 2.5 mg by nebulization every 6 (six) hours as needed for wheezing or shortness of breath. What changed:  Another medication with the same name was removed. Continue taking this medication, and follow the directions you see here.   baclofen 20 MG tablet Commonly known as:  LIORESAL Take 20 mg by mouth 3 (three) times daily.   [employer] VITAMIN B12 1000 MCG tablet Generic drug:  cyanocobalamin Place 1 tablet (1,000 mcg total) into feeding tube daily.   DULoxetine 30 MG capsule Commonly known as:  CYMBALTA Take 30 mg by mouth 2 (two) times daily.  feeding supplement (OSMOLITE 1.5 CAL) Liqd Place 1,000 mLs into feeding tube daily.   free water Soln Place 100 mLs into feeding tube every 8 (eight) hours.   furosemide 20 MG tablet Commonly known as:  LASIX Take 2 tablets (40 mg total) by mouth daily. What changed:  how much to take  when to take this  Another medication with the same name was removed. Continue taking this medication, and follow the directions you see here.   gabapentin 400 MG capsule Commonly known as:  NEURONTIN Take 400 mg by mouth 4 (four) times daily.   ibuprofen 200 MG tablet Commonly known as:  ADVIL,MOTRIN Place 2 tablets (400 mg total) into feeding tube every 6 (six) hours as needed for moderate pain. What changed:  how much to take  reasons to take this   lidocaine 5 % Commonly known as:  LIDODERM Place 1 patch onto the skin 2 (two) times daily as needed (pain).   midodrine 10 MG tablet Commonly known as:  PROAMATINE Place 1 tablet (10 mg total) into feeding tube 3 (three) times daily with meals.   tiotropium 18 MCG  inhalation capsule Commonly known as:  SPIRIVA HANDIHALER Place 1 capsule (18 mcg total) into inhaler and inhale daily.   tiZANidine 4 MG tablet Commonly known as:  ZANAFLEX Take 4 mg by mouth 3 (three) times daily.   zinc oxide 11.3 % Crea cream Commonly known as:  BALMEX Apply 1 application topically 3 (three) times daily as needed (for skin redness/irritation).     ASK your doctor about these medications   clindamycin 300 MG capsule Commonly known as:  CLEOCIN Take 1 capsule (300 mg total) by mouth every 8 (eight) hours. Ask about: Should I take this medication?   levofloxacin 500 MG tablet Commonly known as:  LEVAQUIN Take 1 tablet (500 mg total) by mouth daily. Ask about: Should I take this medication?      Discharge Instructions: Please refer to Patient Instrutions section of EMR for full details.  Patient was counseled important signs and symptoms that should prompt return to medical care, changes in medications, dietary instructions, activity restrictions, and follow up appointments.   Follow-Up Appointments: Follow-up [place] Follow up.   Why:  please call to make hospital follow up apt  Contact information: [place] [address] [phone]       Care, [facility] Follow up.   Specialty:  Home Health Services Why:  Home health services arranged, office will call and set up home visits. Contact information: [address] [place] [address] [phone]          Lovenia Kim, MD 06/27/2016, 11:44 PM PGY-1, [place]

## 2016-06-27 NOTE — Progress Notes (Signed)
Hemorrhagic cystitis, most likely secondary to Pseudomonas UTI.  Upper tract evaluation included an ultrasound revealing normal kidneys.  Her gross hematuria has cleared.  I think it's okay to stop the continuous bladder irrigation, I will switch the patient to a smaller catheter.  If her urine remains clear.  Over the next day, I'm fine letting her go home from a urologic standpoint.    Franchot Gallo M 06/27/2016, 6:26 AM

## 2016-06-27 NOTE — Discharge Summary (Signed)
Physician Discharge Summary  Angela Page OAC:[id number] DOB: 05/07/1970 DOA: 06/23/2016  PCP: System, Provider Not In  Admit date: 06/23/2016 Discharge date: 06/27/2016  Recommendations for Outpatient Follow-up:  1. Pt will need to follow up with PCP in 1-2 weeks post discharge 2. Please obtain BMP to evaluate electrolytes and kidney function 3. Please also check CBC to evaluate Hg and Hct levels  Discharge Diagnoses:  Principal Problem:   Acute hemorrhagic cystitis Active Problems:   Depression   Quadriplegia, C5-C7 incomplete (HCC)   Decubitus ulcer of sacral region, stage 4 (HCC)   Anemia, iron deficiency   Lower urinary tract infectious disease   Hypotension   Chronic diastolic CHF (congestive heart failure) ([place])  Discharge Condition: Stable  Diet recommendation: Heart healthy diet discussed in details   Brief Narrative / Interim history: Pt is 46 yo female with known spastic quadriplegia, feeding tube dependent, chronic foley catheter, stage IV sacral decubitus ulcer, chronic hypotension, presented with gross hematuria.   Assessment and plan: Principal Problem:   Acute hemorrhagic cystitis in pt with recurrent MDR UTI, acute blood loss anemia  - continue Meropenem given hx of MDR, today is day #4 - discussed with Dr. Johnnye Sima on call, he recommends no further ABX treatment as this could be chronic colonization and there is high risk of developing more resistance  - urology team cleared for discharge, pt wants to go home - she is stable from our standpoint   Active Problems:   Hypertensive urgency - suspect this was due to clot in foley, once removed, BP has stabilized - stable on discharge     Abd distension - resolved     Depression - stable     Quadriplegia, C5-C7 incomplete ([place])   Decubitus ulcer of sacral region, stage 4 (HCC) - wound in sacral and bilateral ischium areas, chronic wounds - right ischium - with healed scar - left ischium - chronic  stage IV pressure injury, moderate drainage but no odor  - keep on air mattress, aquacel to left ischium and sacrum, foam dressing to protect wounds     Chronic diastolic CHF (congestive heart failure) (HCC) - stable respiratory status   DVT prophylaxis: SCD's Code Status: Full  Family Communication: mom updated over the phone  Disposition Plan: home  Consultants:   Urology   Procedures:   CBI  Antimicrobials:  Meropenem 6/5 >> 6/7  Cefepime 6/7 >>  Procedures/Studies: Dg Chest 2 View  Result Date: 05/28/2016 CLINICAL DATA:  Quadriplegic.  Edema and shortness of breath. EXAM: CHEST  2 VIEW COMPARISON:  Chest radiograph 01/21/2016 FINDINGS: Tracheostomy tube tip is at the level of the clavicular heads. Left chest wall AICD leads are in unchanged position. Cardiomediastinal contours are unchanged. There is an unchanged small right pleural effusion. No pneumothorax. No focal airspace consolidation. IMPRESSION: Unchanged small right pleural effusion without active cardiopulmonary process. Electronically Signed   By: Ulyses Jarred M.D.   On: 05/28/2016 15:07   Ct Angio Chest Pe W Or Wo Contrast  Result Date: 06/09/2016 CLINICAL DATA:  Acute onset of dyspnea.  Initial encounter. EXAM: CT ANGIOGRAPHY CHEST WITH CONTRAST TECHNIQUE: Multidetector CT imaging of the chest was performed using the standard protocol during bolus administration of intravenous contrast. Multiplanar CT image reconstructions and MIPs were obtained to evaluate the vascular anatomy. CONTRAST:  100 mL of Isovue 370 IV contrast COMPARISON:  CTA of the chest performed 01/19/2016, and chest radiograph performed earlier today at 2:27 p.m. FINDINGS: Cardiovascular:  There is no  evidence of pulmonary embolus. The heart is borderline normal in size. Pacemaker leads are noted ending at the right atrium and right ventricle. A left chest wall pacemaker is seen. Diffuse coronary artery calcifications are appreciated. There is  minimal calcification along the aortic arch and proximal great vessels. Mediastinum/Nodes: A 1.4 cm subcarinal node is suggested. No additional mediastinal lymphadenopathy is seen. No pericardial effusion is identified. The patient's tracheostomy tube is grossly unremarkable in appearance. The visualized portions of the thyroid gland are unremarkable. No axillary lymphadenopathy is appreciated. Lungs/Pleura: There is complete consolidation of the right lower lobe, and dense airspace opacification involving portions of the right upper and middle lobes. Minimal opacity is also noted at the left lower lobe and left lingula. Findings are compatible with bilateral pneumonia. A trace right-sided pleural effusion is noted. No pneumothorax is seen. No dominant mass is identified. Given partial opacification of the bronchioles to the right lower lobe, would correlate clinically for evidence of aspiration. Upper Abdomen: The visualized portions of the liver and spleen are grossly unremarkable. The visualized portions of the adrenal glands and kidneys are within normal limits. Musculoskeletal: No acute osseous abnormalities are identified. The visualized musculature is unremarkable in appearance. Review of the MIP images confirms the above findings. IMPRESSION: 1. No evidence of pulmonary embolus. 2. Complete consolidation of the right lower lung lobe, and dense airspace opacification involving portions of the right upper and middle lobes. Minimal airspace opacity at the left lower lobe and left lingula. Findings compatible with bilateral pneumonia. Trace right-sided pleural effusion noted. Given partial opacification of the bronchioles to the right lower lobe, underlying aspiration is suspected. Would correlate clinically. 3. Diffuse coronary artery calcifications noted. 4. 1.4 cm subcarinal node may reflect the underlying pneumonia. Electronically Signed   By: Garald Balding M.D.   On: 06/09/2016 21:15   US Abdomen  Complete  Result Date: 06/10/2016 CLINICAL DATA:  Abnormal transaminases.  Anasarca EXAM: ABDOMEN ULTRASOUND COMPLETE COMPARISON:  11/12/2015 FINDINGS: Gallbladder: No gallstones or wall thickening visualized. No sonographic Murphy sign noted by sonographer. Common bile duct: Diameter: 4 mm. Where visualized, no filling defect. Liver: No focal lesion identified. Within normal limits in parenchymal echogenicity. Antegrade flow in the main portal vein. IVC: No abnormality visualized. Pancreas: Visualized portion unremarkable. Spleen: Size and appearance within normal limits. Right Kidney: Length: 10.7 cm. Echogenicity within normal limits. 9 mm shadowing calculus over the upper pole. No mass or hydronephrosis visualized. Left Kidney: Length: 10 cm. Echogenicity within normal limits. No mass or hydronephrosis visualized. Abdominal aorta: No aneurysm or other abnormality visualized. The distal aorta is obscured by bowel gas. Other findings: Right basilar consolidation as seen on chest CT yesterday. IMPRESSION: 1. Normal appearance of the liver and biliary tree. 2. 9 mm right renal calculus. Electronically Signed   By: Monte Fantasia M.D.   On: 06/10/2016 13:51   Dg Chest Portable 1 View  Result Date: 06/09/2016 CLINICAL DATA:  Edema, fever, short of breath EXAM: PORTABLE CHEST 1 VIEW COMPARISON:  05/28/2016 FINDINGS: Mild vascular congestion has progressed. Progression of small right effusion and right lower lobe airspace disease. Tracheostomy in good position.  Dual lead pacemaker unchanged. IMPRESSION: Progression of vascular congestion and small right effusion most likely due to fluid overload. Progression of right lower lobe atelectasis. Electronically Signed   By: Franchot Gallo M.D.   On: 06/09/2016 14:38     Discharge Exam: Vitals:   06/27/16 0729 06/27/16 0808  BP: (!) 94/51   Pulse:  88  Resp:  18  Temp:     Vitals:   06/27/16 0729 06/27/16 0807 06/27/16 0808 06/27/16 1142  BP: (!) 94/51      Pulse:   88   Resp:   18   Temp:      TempSrc:      SpO2:  99%  98%  Weight:      Height:       General: Pt is alert, follows commands appropriately, not in acute distress Cardiovascular: Regular rate and rhythm, S1/S2 +, no murmurs, no rubs, no gallops Respiratory: Clear to auscultation bilaterally, no wheezing, no crackles, no rhonchi Abdominal: Soft, non tender, non distended, bowel sounds +, no guarding  Discharge Instructions  Discharge Instructions    Diet - low sodium heart healthy    Complete by:  As directed    Increase activity slowly    Complete by:  As directed      Allergies as of 06/27/2016      Reactions   Erythromycin Nausea And Vomiting   Nitrofurantoin Monohyd Macro Nausea And Vomiting   Penicillins Hives   Has patient had a PCN reaction causing immediate rash, facial/tongue/throat swelling, SOB or lightheadedness with hypotension: Yes Has patient had a PCN reaction causing severe rash involving mucus membranes or skin necrosis: Yes Has patient had a PCN reaction that required hospitalization Yes- went to the DR may have been from amoxicillin Has patient had a PCN reaction occurring within the last 10 years: No-childhood allergy If all of the above answers are "NO", then may proceed with Cephalosporin       Medication List    TAKE these medications   acetaminophen 500 MG tablet Commonly known as:  TYLENOL Take 1,000 mg by mouth every 6 (six) hours as needed for mild pain, moderate pain, fever or headache.   albuterol (2.5 MG/3ML) 0.083% nebulizer solution Commonly known as:  PROVENTIL Take 2.5 mg by nebulization every 6 (six) hours as needed for wheezing or shortness of breath.   alum & mag hydroxide-simeth 200-200-20 MG/5ML suspension Commonly known as:  MAALOX/MYLANTA Take 30 mLs by mouth every 6 (six) hours as needed for indigestion or heartburn.   baclofen 20 MG tablet Commonly known as:  LIORESAL Take 20 mg by mouth 3 (three) times daily.    [employer] VITAMIN B12 1000 MCG tablet Generic drug:  cyanocobalamin Place 1 tablet (1,000 mcg total) into feeding tube daily.   DULoxetine 30 MG capsule Commonly known as:  CYMBALTA Take 30 mg by mouth 2 (two) times daily.   PROMOD Liqd Take 30 mLs by mouth 2 (two) times daily.   feeding supplement (OSMOLITE 1.2 CAL) Liqd Place 100 mLs into feeding tube See admin instructions. Given for 10 hours at a time.  Given between 2300 - 0900 the next day.   feeding supplement (OSMOLITE 1.5 CAL) Liqd Place 1,000 mLs into feeding tube daily.   free water Soln Place 100 mLs into feeding tube every 8 (eight) hours.   furosemide 20 MG tablet Commonly known as:  LASIX Take 2 tablets (40 mg total) by mouth daily.   gabapentin 400 MG capsule Commonly known as:  NEURONTIN Take 400 mg by mouth 4 (four) times daily.   ibuprofen 200 MG tablet Commonly known as:  ADVIL,MOTRIN Place 2 tablets (400 mg total) into feeding tube every 6 (six) hours as needed for moderate pain. What changed:  how much to take  reasons to take this   lidocaine 5 % Commonly known as:  LIDODERM Place 1 patch onto the skin 2 (two) times daily as needed (pain).   midodrine 10 MG tablet Commonly known as:  PROAMATINE Place 1 tablet (10 mg total) into feeding tube 3 (three) times daily with meals.   neomycin-bacitracin-polymyxin ointment Commonly known as:  NEOSPORIN Apply 1 application topically as needed for wound care. apply to eye   Oxycodone HCl 10 MG Tabs Take 10 mg by mouth every 4 (four) hours as needed (for pain).   OXYGEN Inhale 3 L into the lungs continuous.   polyethylene glycol packet Commonly known as:  MIRALAX / GLYCOLAX Take 17 g by mouth daily as needed (constipation).   temazepam 7.5 MG capsule Commonly known as:  RESTORIL Take 7.5 mg by mouth at bedtime.   tiotropium 18 MCG inhalation capsule Commonly known as:  SPIRIVA HANDIHALER Place 1 capsule (18 mcg total) into inhaler and inhale  daily.   tiZANidine 4 MG tablet Commonly known as:  ZANAFLEX Take 4 mg by mouth 3 (three) times daily.   zinc oxide 11.3 % Crea cream Commonly known as:  BALMEX Apply 1 application topically 3 (three) times daily as needed (for skin redness/irritation).      Follow-up Information    Franchot Gallo, MD Follow up.   Specialty:  Urology Why:  We will call you to set up appointment for follow-up Contact information: [place] [address] [phone]          The results of significant diagnostics from this hospitalization (including imaging, microbiology, ancillary and laboratory) are listed below for reference.    Microbiology: Recent Results (from the past 240 hour(s))  Urine culture     Status: Abnormal   Collection Time: 06/23/16  1:55 PM  Result Value Ref Range Status   Specimen Description URINE, CLEAN CATCH  Final   Special Requests Normal  Final   Culture >=100,000 COLONIES/mL PSEUDOMONAS AERUGINOSA (A)  Final   Report Status 06/26/2016 FINAL  Final   Organism ID, Bacteria PSEUDOMONAS AERUGINOSA (A)  Final      Susceptibility   Pseudomonas aeruginosa - MIC*    CEFTAZIDIME 2 SENSITIVE Sensitive     CIPROFLOXACIN >=4 RESISTANT Resistant     GENTAMICIN >=16 RESISTANT Resistant     IMIPENEM <=0.25 SENSITIVE Sensitive     PIP/TAZO <=4 SENSITIVE Sensitive     CEFEPIME 2 SENSITIVE Sensitive     * >=100,000 COLONIES/mL PSEUDOMONAS AERUGINOSA     Labs: Basic Metabolic Panel:  Recent Labs Lab 06/24/16 0256 06/25/16 0357 06/25/16 1441 06/26/16 0314 06/27/16 0449  NA 137 141 141 141 142  K 3.4* 4.0 4.4 4.1 4.1  CL 92* 101 102 104 102  CO2 35* 36* 32 33* 34*  GLUCOSE 92 164* 119* 157* 145*  BUN 46* 19 17 14 16   CREATININE 0.48 <0.30* 0.40* <0.30* <0.30*  CALCIUM 9.3 8.8* 9.0 8.9 9.2   Liver Function Tests:  Recent Labs Lab 06/23/16 1500  AST 38  ALT 35  ALKPHOS 114  BILITOT 0.2*  PROT 7.6  ALBUMIN 3.7   CBC:  Recent Labs Lab  06/23/16 1500 06/24/16 0256 06/25/16 0357 06/25/16 1441 06/26/16 0314 06/27/16 0449  WBC 16.0* 10.5 7.5 12.0* 6.3 5.5  NEUTROABS 11.6* 6.8  --   --   --   --   HGB 10.9* 10.2* 7.9* 9.9* 8.2* 7.4*  HCT 33.0* 31.0* 24.4* 31.2* 25.3* 23.1*  MCV 94.0 94.5 98.4 98.1 96.2 97.9  PLT 425* 340 245 315 257 273  BNP (last 3 results)  Recent Labs  01/19/16 1535 05/28/16 1342 06/09/16 1447  BNP 27.4 36.3 150.8*   CBG:  Recent Labs Lab 06/25/16 1511 06/26/16 0745 06/26/16 1151 06/26/16 1619 06/27/16 0818  GLUCAP 135* 164* 121* 119* 174*   SIGNED: Time coordinating discharge:  30 minutes  Faye Ramsay, MD  [facility] 06/27/2016, 12:21 PM Pager [phone]  If 7PM-7AM, please contact night-coverage [email or web address] Password [email or web address]

## 2016-06-27 NOTE — Discharge Instructions (Signed)

## 2016-06-27 NOTE — Care Management Note (Signed)
Case Management Note  Patient Details  Name: Clotine Heiner MRN: [id number] Date of Birth: [birth year]  Subjective/Objective: Spastic Quadriplegia.Patient active w/[facility] for HHRN/aide-TF-indep w/,custodial care,trach care-indep.[facility] called for transport back home 2p pick up-confirmed address-patient states her mother will receiver her @ home.Forms in shadow chart-face sheet, med necessity form.No further CM needs.                 Action/Plan:d/c home.   Expected Discharge Date:  06/27/16               Expected Discharge Plan:  [place]  In-House Referral:     Discharge planning Services  CM Consult  Post Acute Care Choice:    Choice offered to:  NA  DME Arranged:    DME Agency:     HH Arranged:    [place]:  [place]  Status of Service:  Completed, signed off  If discussed at [place] of Stay Meetings, dates discussed:    Additional Comments:  Dessa Phi, RN 06/27/2016, 12:48 PM

## 2016-06-30 ENCOUNTER — Inpatient Hospital Stay (HOSPITAL_COMMUNITY): Payer: Medicare Other

## 2016-06-30 ENCOUNTER — Encounter (HOSPITAL_COMMUNITY): Payer: Self-pay | Admitting: Emergency Medicine

## 2016-06-30 ENCOUNTER — Inpatient Hospital Stay (HOSPITAL_COMMUNITY)
Admission: EM | Admit: 2016-06-30 | Discharge: 2016-07-03 | DRG: 698 | Disposition: A | Discharge status: Home Health | Payer: Medicare Other | Attending: Family Medicine | Admitting: Family Medicine

## 2016-06-30 DIAGNOSIS — Z881 Allergy status to other antibiotic agents status: Secondary | ICD-10-CM | POA: Diagnosis not present

## 2016-06-30 DIAGNOSIS — N3001 Acute cystitis with hematuria: Secondary | ICD-10-CM | POA: Diagnosis not present

## 2016-06-30 DIAGNOSIS — Z981 Arthrodesis status: Secondary | ICD-10-CM

## 2016-06-30 DIAGNOSIS — T83518A Infection and inflammatory reaction due to other urinary catheter, initial encounter: Principal | ICD-10-CM | POA: Diagnosis present

## 2016-06-30 DIAGNOSIS — D509 Iron deficiency anemia, unspecified: Secondary | ICD-10-CM | POA: Diagnosis present

## 2016-06-30 DIAGNOSIS — Z79899 Other long term (current) drug therapy: Secondary | ICD-10-CM | POA: Diagnosis not present

## 2016-06-30 DIAGNOSIS — A419 Sepsis, unspecified organism: Secondary | ICD-10-CM | POA: Diagnosis not present

## 2016-06-30 DIAGNOSIS — Y846 Urinary catheterization as the cause of abnormal reaction of the patient, or of later complication, without mention of misadventure at the time of the procedure: Secondary | ICD-10-CM | POA: Diagnosis present

## 2016-06-30 DIAGNOSIS — L89154 Pressure ulcer of sacral region, stage 4: Secondary | ICD-10-CM | POA: Diagnosis not present

## 2016-06-30 DIAGNOSIS — Z880 Allergy status to penicillin: Secondary | ICD-10-CM

## 2016-06-30 DIAGNOSIS — K219 Gastro-esophageal reflux disease without esophagitis: Secondary | ICD-10-CM | POA: Diagnosis present

## 2016-06-30 DIAGNOSIS — I9589 Other hypotension: Secondary | ICD-10-CM | POA: Diagnosis present

## 2016-06-30 DIAGNOSIS — R319 Hematuria, unspecified: Secondary | ICD-10-CM | POA: Diagnosis present

## 2016-06-30 DIAGNOSIS — J45909 Unspecified asthma, uncomplicated: Secondary | ICD-10-CM | POA: Diagnosis present

## 2016-06-30 DIAGNOSIS — B965 Pseudomonas (aeruginosa) (mallei) (pseudomallei) as the cause of diseases classified elsewhere: Secondary | ICD-10-CM | POA: Diagnosis present

## 2016-06-30 DIAGNOSIS — Z87440 Personal history of urinary (tract) infections: Secondary | ICD-10-CM | POA: Diagnosis not present

## 2016-06-30 DIAGNOSIS — G8254 Quadriplegia, C5-C7 incomplete: Secondary | ICD-10-CM | POA: Diagnosis present

## 2016-06-30 DIAGNOSIS — Z950 Presence of cardiac pacemaker: Secondary | ICD-10-CM | POA: Diagnosis not present

## 2016-06-30 DIAGNOSIS — Z930 Tracheostomy status: Secondary | ICD-10-CM

## 2016-06-30 DIAGNOSIS — N390 Urinary tract infection, site not specified: Secondary | ICD-10-CM | POA: Diagnosis not present

## 2016-06-30 DIAGNOSIS — Z8249 Family history of ischemic heart disease and other diseases of the circulatory system: Secondary | ICD-10-CM

## 2016-06-30 DIAGNOSIS — Z9981 Dependence on supplemental oxygen: Secondary | ICD-10-CM | POA: Diagnosis not present

## 2016-06-30 DIAGNOSIS — F17200 Nicotine dependence, unspecified, uncomplicated: Secondary | ICD-10-CM | POA: Diagnosis present

## 2016-06-30 DIAGNOSIS — E8809 Other disorders of plasma-protein metabolism, not elsewhere classified: Secondary | ICD-10-CM | POA: Diagnosis present

## 2016-06-30 DIAGNOSIS — G8250 Quadriplegia, unspecified: Secondary | ICD-10-CM | POA: Diagnosis present

## 2016-06-30 DIAGNOSIS — J189 Pneumonia, unspecified organism: Secondary | ICD-10-CM | POA: Diagnosis present

## 2016-06-30 DIAGNOSIS — I959 Hypotension, unspecified: Secondary | ICD-10-CM | POA: Diagnosis present

## 2016-06-30 DIAGNOSIS — R059 Cough, unspecified: Secondary | ICD-10-CM

## 2016-06-30 DIAGNOSIS — F419 Anxiety disorder, unspecified: Secondary | ICD-10-CM | POA: Diagnosis present

## 2016-06-30 DIAGNOSIS — Z931 Gastrostomy status: Secondary | ICD-10-CM

## 2016-06-30 DIAGNOSIS — R05 Cough: Secondary | ICD-10-CM

## 2016-06-30 LAB — COMPREHENSIVE METABOLIC PANEL
ALBUMIN: 3 g/dL — AB (ref 3.5–5.0)
ALK PHOS: 146 U/L — AB (ref 38–126)
ALT: 50 U/L (ref 14–54)
ANION GAP: 10 (ref 5–15)
AST: 43 U/L — ABNORMAL HIGH (ref 15–41)
BUN: 24 mg/dL — ABNORMAL HIGH (ref 6–20)
CHLORIDE: 92 mmol/L — AB (ref 101–111)
CO2: 37 mmol/L — AB (ref 22–32)
Calcium: 9.3 mg/dL (ref 8.9–10.3)
Creatinine, Ser: 0.39 mg/dL — ABNORMAL LOW (ref 0.44–1.00)
GFR calc Af Amer: >60 mL/min (ref >60)
GFR calc non Af Amer: >60 mL/min (ref >60)
GLUCOSE: 83 mg/dL (ref 65–99)
POTASSIUM: 3.8 mmol/L (ref 3.5–5.1)
SODIUM: 139 mmol/L (ref 135–145)
Total Bilirubin: 0.4 mg/dL (ref 0.3–1.2)
Total Protein: 7.1 g/dL (ref 6.5–8.1)

## 2016-06-30 LAB — CBC WITH DIFFERENTIAL/PLATELET
Basophils Absolute: 0 10*3/uL (ref 0.0–0.1)
Basophils Relative: 0 %
Eosinophils Absolute: 0.6 10*3/uL (ref 0.0–0.7)
Eosinophils Relative: 4 %
HEMATOCRIT: 24.6 % — AB (ref 36.0–46.0)
Hemoglobin: 7.9 g/dL — ABNORMAL LOW (ref 12.0–15.0)
LYMPHS ABS: 1.7 10*3/uL (ref 0.7–4.0)
LYMPHS PCT: 11 %
MCH: 31.1 pg (ref 26.0–34.0)
MCHC: 32.1 g/dL (ref 30.0–36.0)
MCV: 96.9 fL (ref 78.0–100.0)
MONO ABS: 1.1 10*3/uL — AB (ref 0.1–1.0)
MONOS PCT: 7 %
NEUTROS ABS: 12.5 10*3/uL — AB (ref 1.7–7.7)
Neutrophils Relative %: 78 %
Platelets: 344 10*3/uL (ref 150–400)
RBC: 2.54 MIL/uL — ABNORMAL LOW (ref 3.87–5.11)
RDW: 14.3 % (ref 11.5–15.5)
WBC: 15.8 10*3/uL — ABNORMAL HIGH (ref 4.0–10.5)

## 2016-06-30 LAB — URINALYSIS, ROUTINE W REFLEX MICROSCOPIC
BILIRUBIN URINE: NEGATIVE
GLUCOSE, UA: NEGATIVE mg/dL
KETONES UR: NEGATIVE mg/dL
NITRITE: NEGATIVE
PROTEIN: 100 mg/dL — AB
Specific Gravity, Urine: 1.004 — ABNORMAL LOW (ref 1.005–1.030)
pH: 6 (ref 5.0–8.0)

## 2016-06-30 LAB — PREPARE RBC (CROSSMATCH)

## 2016-06-30 LAB — I-STAT CG4 LACTIC ACID, ED
LACTIC ACID, VENOUS: 0.5 mmol/L (ref 0.5–1.9)
Lactic Acid, Venous: 0.67 mmol/L (ref 0.5–1.9)

## 2016-06-30 LAB — MRSA PCR SCREENING: MRSA by PCR: NEGATIVE

## 2016-06-30 LAB — HEMOGLOBIN: Hemoglobin: 7.4 g/dL — ABNORMAL LOW (ref 12.0–15.0)

## 2016-06-30 LAB — HEMATOCRIT: HEMATOCRIT: 22.6 % — AB (ref 36.0–46.0)

## 2016-06-30 MED ORDER — DULOXETINE HCL 30 MG PO CPEP
30.0000 mg | ORAL_CAPSULE | Freq: Two times a day (BID) | ORAL | Status: DC
  Administered 2016-06-30 – 2016-07-03 (×7): 30 mg via ORAL
  Filled 2016-06-30 (×7): qty 1

## 2016-06-30 MED ORDER — OSMOLITE 1.2 CAL PO LIQD
1000.0000 mL | ORAL | Status: DC
  Administered 2016-07-01 – 2016-07-03 (×3): 1000 mL

## 2016-06-30 MED ORDER — MIDODRINE HCL 5 MG PO TABS
10.0000 mg | ORAL_TABLET | Freq: Three times a day (TID) | ORAL | Status: DC
  Administered 2016-06-30 – 2016-07-02 (×9): 10 mg
  Filled 2016-06-30 (×10): qty 2

## 2016-06-30 MED ORDER — IBUPROFEN 100 MG/5ML PO SUSP
400.0000 mg | Freq: Four times a day (QID) | ORAL | Status: DC | PRN
  Filled 2016-06-30: qty 30

## 2016-06-30 MED ORDER — DEXTROSE 5 % IV SOLN
2.0000 g | Freq: Once | INTRAVENOUS | Status: AC
  Administered 2016-06-30: 2 g via INTRAVENOUS
  Filled 2016-06-30: qty 2

## 2016-06-30 MED ORDER — OSMOLITE 1.2 CAL PO LIQD: 100.0000 mL | ORAL | Status: DC

## 2016-06-30 MED ORDER — DEXTROSE 5 % IV SOLN
2.0000 g | Freq: Two times a day (BID) | INTRAVENOUS | Status: DC
  Administered 2016-06-30 – 2016-07-03 (×7): 2 g via INTRAVENOUS
  Filled 2016-06-30 (×10): qty 2

## 2016-06-30 MED ORDER — FREE WATER
100.0000 mL | Freq: Three times a day (TID) | Status: DC
  Administered 2016-06-30 – 2016-07-03 (×10): 100 mL

## 2016-06-30 MED ORDER — LIP MEDEX EX OINT
TOPICAL_OINTMENT | Freq: Once | CUTANEOUS | Status: AC
  Administered 2016-06-30: 15:00:00 via TOPICAL
  Filled 2016-06-30: qty 7

## 2016-06-30 MED ORDER — SODIUM CHLORIDE 0.9 % IV BOLUS (SEPSIS)
250.0000 mL | Freq: Once | INTRAVENOUS | Status: AC
  Administered 2016-06-30: 250 mL via INTRAVENOUS

## 2016-06-30 MED ORDER — SODIUM CHLORIDE 0.9 % IV BOLUS (SEPSIS)
1000.0000 mL | Freq: Once | INTRAVENOUS | Status: AC
  Administered 2016-06-30: 1000 mL via INTRAVENOUS

## 2016-06-30 MED ORDER — LIDOCAINE 5 % EX PTCH
1.0000 | MEDICATED_PATCH | Freq: Every day | CUTANEOUS | Status: DC | PRN
  Filled 2016-06-30: qty 1

## 2016-06-30 MED ORDER — BACITRACIN-NEOMYCIN-POLYMYXIN 400-5-5000 EX OINT: 1.0000 "application " | TOPICAL_OINTMENT | CUTANEOUS | Status: DC | PRN

## 2016-06-30 MED ORDER — ALUM & MAG HYDROXIDE-SIMETH 200-200-20 MG/5ML PO SUSP: 30.0000 mL | Freq: Four times a day (QID) | ORAL | Status: DC | PRN

## 2016-06-30 MED ORDER — TIOTROPIUM BROMIDE MONOHYDRATE 18 MCG IN CAPS
18.0000 ug | ORAL_CAPSULE | Freq: Every day | RESPIRATORY_TRACT | Status: DC
  Administered 2016-06-30 – 2016-07-03 (×4): 18 ug via RESPIRATORY_TRACT
  Filled 2016-06-30: qty 5

## 2016-06-30 MED ORDER — SODIUM CHLORIDE 0.9 % IV SOLN
INTRAVENOUS | Status: DC
  Administered 2016-06-30: 05:00:00 via INTRAVENOUS

## 2016-06-30 MED ORDER — POLYETHYLENE GLYCOL 3350 17 G PO PACK: 17.0000 g | PACK | Freq: Every day | ORAL | Status: DC | PRN

## 2016-06-30 MED ORDER — ACETAMINOPHEN 500 MG PO TABS: 1000.0000 mg | ORAL_TABLET | Freq: Four times a day (QID) | ORAL | Status: DC | PRN

## 2016-06-30 MED ORDER — BACLOFEN 20 MG PO TABS
20.0000 mg | ORAL_TABLET | Freq: Three times a day (TID) | ORAL | Status: DC
  Administered 2016-06-30 – 2016-07-03 (×10): 20 mg via ORAL
  Filled 2016-06-30 (×4): qty 2
  Filled 2016-06-30: qty 1
  Filled 2016-06-30 (×3): qty 2
  Filled 2016-06-30: qty 1
  Filled 2016-06-30: qty 2

## 2016-06-30 MED ORDER — OXYCODONE HCL 5 MG PO TABS
10.0000 mg | ORAL_TABLET | ORAL | Status: DC | PRN
  Administered 2016-06-30 – 2016-07-03 (×13): 10 mg via ORAL
  Filled 2016-06-30 (×13): qty 2

## 2016-06-30 MED ORDER — IBUPROFEN 200 MG PO TABS: 400.0000 mg | ORAL_TABLET | Freq: Four times a day (QID) | ORAL | Status: DC | PRN

## 2016-06-30 MED ORDER — GABAPENTIN 400 MG PO CAPS
400.0000 mg | ORAL_CAPSULE | Freq: Four times a day (QID) | ORAL | Status: DC
  Administered 2016-06-30 – 2016-07-03 (×13): 400 mg via ORAL
  Filled 2016-06-30 (×13): qty 1

## 2016-06-30 MED ORDER — OXYCODONE HCL 5 MG PO TABS
10.0000 mg | ORAL_TABLET | Freq: Once | ORAL | Status: AC
  Administered 2016-06-30: 10 mg via ORAL
  Filled 2016-06-30: qty 2

## 2016-06-30 MED ORDER — PROMOD PO LIQD: 30.0000 mL | Freq: Two times a day (BID) | ORAL | Status: DC

## 2016-06-30 MED ORDER — ZOLPIDEM TARTRATE 5 MG PO TABS
5.0000 mg | ORAL_TABLET | Freq: Every evening | ORAL | Status: DC | PRN
  Administered 2016-06-30 – 2016-07-02 (×3): 5 mg via ORAL
  Filled 2016-06-30 (×3): qty 1

## 2016-06-30 MED ORDER — ALBUTEROL SULFATE (2.5 MG/3ML) 0.083% IN NEBU
2.5000 mg | INHALATION_SOLUTION | Freq: Four times a day (QID) | RESPIRATORY_TRACT | Status: DC | PRN
  Administered 2016-07-02: 2.5 mg via RESPIRATORY_TRACT
  Filled 2016-06-30 (×2): qty 3

## 2016-06-30 MED ORDER — BISACODYL 5 MG PO TBEC
5.0000 mg | DELAYED_RELEASE_TABLET | Freq: Every day | ORAL | Status: DC | PRN
  Filled 2016-06-30: qty 1

## 2016-06-30 MED ORDER — LIDOCAINE 5 % EX PTCH
1.0000 | MEDICATED_PATCH | Freq: Two times a day (BID) | CUTANEOUS | Status: DC | PRN
  Filled 2016-06-30: qty 1

## 2016-06-30 MED ORDER — PRO-STAT SUGAR FREE PO LIQD
30.0000 mL | Freq: Two times a day (BID) | ORAL | Status: DC
  Administered 2016-06-30 – 2016-07-03 (×6): 30 mL via ORAL
  Filled 2016-06-30 (×7): qty 30

## 2016-06-30 MED ORDER — SODIUM CHLORIDE 0.9 % IV SOLN: Freq: Once | INTRAVENOUS | Status: DC

## 2016-06-30 MED ORDER — ZINC OXIDE 11.3 % EX CREA
1.0000 "application " | TOPICAL_CREAM | Freq: Three times a day (TID) | CUTANEOUS | Status: DC | PRN
  Filled 2016-06-30: qty 56

## 2016-06-30 MED ORDER — TIZANIDINE HCL 4 MG PO TABS
4.0000 mg | ORAL_TABLET | Freq: Three times a day (TID) | ORAL | Status: DC
  Administered 2016-06-30 – 2016-07-03 (×10): 4 mg via ORAL
  Filled 2016-06-30 (×10): qty 1

## 2016-06-30 NOTE — ED Notes (Signed)
Dr. Olevia Bowens, hospitalist, at bedside.

## 2016-06-30 NOTE — ED Notes (Signed)
Bed: SW10 Expected date:  Expected time:  Means of arrival:  Comments: 46 yo F/Hematuria

## 2016-06-30 NOTE — Progress Notes (Signed)
PROGRESS NOTE    Angela Page  XFG:[id number] DOB: 1970/02/21 DOA: 06/30/2016 PCP: System, Provider Not In    Brief Narrative: Angela Page is a 46 y.o. 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 He was recently admitted from 06/23/2016 until 06/27/2016 due to hemorrhagic cystitis. She denies headache, dyspnea, productive cough, dizziness, emesis or diarrhea. She has frequent constipation. Her most recent urine culture showed pseudomonas aeruginosa resistant to ciprofloxacin and gentamicin. In the past, she has had MDR Acinetobacter UTI.  ED Course: The patient received 20-50 mL NS bolus and cefepime 2 g IVPB experiencing relief. She subsequently was given oxycodone 10 mg by mouth. She declined continuous bladder irrigation.   Urinalysis showed red color with large hemoglobin, mild proteinuria, large leukocyte esterase and negative nitrates. Urine and blood cultures were taken. Her WBC was 15.9 with 78% neutrophils, hemoglobin 7.9 mg/dL and platelets 344. Sodium 139, potassium 3.8, chloride 92, bicarbonate 37 lactic acid 0.67 mmol/L. BUN was 24, creatinine 0.39 and glucose 83 mg/dL. . EKG now shows an atrial paced rhythm.    Assessment & Plan:   Principal Problem:   Sepsis secondary to UTI [facility]) Active Problems:   Quadriplegia, C5-C7 incomplete ([place])   Decubitus ulcer of sacral region, stage 4 (HCC)   Anemia, iron deficiency   Hypotension   Tracheostomy status (HCC)   Hypoalbuminemia   Acute hemorrhagic cystitis  Sepsis secondary to UTI.  Continue with IV fluids.  IV antibiotics,  WBC at 15 on admission.   Anemia;  Will transfuse one  unit PRBC.   Acute hemorrhagic cystitis;  Recurrent.  Prior pseudomonas in Urine culture.  Received 4 days of antibiotics last admission (6-08) Start Bladder irrigation.  Follow urine culture.  IV cefepime.  Will inform urology of patient admission.   PNA;  Continue with cefepime.  Sputum culture.  flouter valve.   Quadriplegia, C5-C7 incomplete ([place]) Decubitus ulcer of sacral region, stage 4 (HCC) Wound care consult.  Chronic hypotension.  Worse in setting of infection and anemia;  IV fluids.  Transfuse one unit PRBC.  Continue with Midodrine.   Tracheostomy status (HCC) Continue supplemental oxygen. Continue tracheostomy care per RT services. Check chest x ray, sputum culture. She report increase secretion from trach.   DVT prophylaxis: scd Code Status: full code.  Family Communication: care discussed with patient  Disposition Plan: remain in the step down unit   Consultants:   none   Procedures:   none   Antimicrobials:   Cefepime.    Subjective: Report productive cough.    Objective: Vitals:   06/30/16 0700 06/30/16 0736 06/30/16 0936 06/30/16 1008  BP: (!) 90/56 (!) 87/55 94/66   Pulse: 80 80 93 88  Resp: 15 13 17 18   Temp:      TempSrc:      SpO2: 99% 98% 93% 94%  Weight:      Height:        Intake/Output Summary (Last 24 hours) at 06/30/16 1200 Last data filed at 06/30/16 1119  Gross per 24 hour  Intake             1350 ml  Output                0  ml  Net             1350 ml   Filed Weights   06/30/16 0035  Weight: 68 kg (150 lb)    Examination:  General exam: no distress, Quadriplegic, trach in place.  Respiratory system:Respiratory effort normal. Decreased breath sounds.  Cardiovascular system: S1 & S2 heard, RRR. No JVD, murmurs, rubs,  Gastrointestinal system: Abdomen is nondistended, soft and nontender. No organomegaly or masses felt. Normal bowel sounds heard. Central nervous system: Alert and oriented. Quadriplegic.    Extremities: no edema Skin: decubitus ulcer not evaluated.    Data Reviewed: I have personally reviewed following labs and imaging studies  CBC:  Recent Labs Lab 06/23/16 1500 06/24/16 0256 06/25/16 0357 06/25/16 1441 06/26/16 0314 06/27/16 0449 06/30/16 0043 06/30/16 0632  WBC 16.0* 10.5 7.5 12.0* 6.3 5.5 15.8*  --   NEUTROABS 11.6* 6.8  --   --   --   --  12.5*  --   HGB 10.9* 10.2* 7.9* 9.9* 8.2* 7.4* 7.9* 7.4*  HCT 33.0* 31.0* 24.4* 31.2* 25.3* 23.1* 24.6* 22.6*  MCV 94.0 94.5 98.4 98.1 96.2 97.9 96.9  --   PLT 425* 340 245 315 257 273 344  --    Basic Metabolic Panel:  Recent Labs Lab 06/25/16 0357 06/25/16 1441 06/26/16 0314 06/27/16 0449 06/30/16 0043  NA 141 141 141 142 139  K 4.0 4.4 4.1 4.1 3.8  CL 101 102 104 102 92*  CO2 36* 32 33* 34* 37*  GLUCOSE 164* 119* 157* 145* 83  BUN 19 17 14 16  24*  CREATININE <0.30* 0.40* <0.30* <0.30* 0.39*  CALCIUM 8.8* 9.0 8.9 9.2 9.3   GFR: Estimated Creatinine Clearance: 82.2 mL/min (A) (by C-G formula based on SCr of 0.39 mg/dL (L)). Liver Function Tests:  Recent Labs Lab 06/23/16 1500 06/30/16 0043  AST 38 43*  ALT 35 50  ALKPHOS 114 146*  BILITOT 0.2* 0.4  PROT 7.6 7.1  ALBUMIN 3.7 3.0*   No results for input(s): LIPASE, AMYLASE in the last 168 hours. No results for input(s): AMMONIA in the last 168 hours. Coagulation Profile:  Recent Labs Lab 06/23/16 1530  INR 0.92   Cardiac Enzymes: No results for input(s): CKTOTAL, CKMB, CKMBINDEX, TROPONINI in the last 168 hours. BNP (last 3 results) No results for input(s): PROBNP in the last 8760 hours. HbA1C: No results for input(s): HGBA1C in the last 72 hours. CBG:  Recent Labs Lab 06/25/16 1511 06/26/16 0745 06/26/16 1151 06/26/16 1619 06/27/16 0818  GLUCAP 135* 164* 121* 119* 174*   Lipid Profile: No results for input(s): CHOL, HDL, LDLCALC, TRIG, CHOLHDL, LDLDIRECT in the last 72 hours. Thyroid Function Tests: No results for input(s):  TSH, T4TOTAL, FREET4, T3FREE, THYROIDAB in the last 72 hours. Anemia Panel: No results for input(s): VITAMINB12, FOLATE, FERRITIN, TIBC, IRON, RETICCTPCT in the last 72 hours. Sepsis Labs:  Recent Labs Lab 06/23/16 1506 06/30/16 0056 06/30/16 0357  LATICACIDVEN 1.34 0.67 0.50    Recent Results (from the past 240 hour(s))  Urine culture     Status: Abnormal   Collection Time: 06/23/16  1:55 PM  Result Value Ref Range Status   Specimen Description URINE, CLEAN CATCH  Final   Special Requests Normal  Final   Culture >=100,000 COLONIES/mL PSEUDOMONAS AERUGINOSA (A)  Final   Report Status 06/26/2016 FINAL  Final   Organism ID, Bacteria PSEUDOMONAS AERUGINOSA (A)  Final      Susceptibility   Pseudomonas aeruginosa - MIC*  CEFTAZIDIME 2 SENSITIVE Sensitive     CIPROFLOXACIN >=4 RESISTANT Resistant     GENTAMICIN >=16 RESISTANT Resistant     IMIPENEM <=0.25 SENSITIVE Sensitive     PIP/TAZO <=4 SENSITIVE Sensitive     CEFEPIME 2 SENSITIVE Sensitive     * >=100,000 COLONIES/mL PSEUDOMONAS AERUGINOSA         Radiology Studies: No results found.      Scheduled Meds: . baclofen  20 mg Oral TID  . DULoxetine  30 mg Oral BID  . feeding supplement (OSMOLITE 1.2 CAL)  100 mL Per Tube See admin instructions  . feeding supplement (PRO-STAT SUGAR FREE 64)  30 mL Oral BID  . free water  100 mL Per Tube Q8H  . gabapentin  400 mg Oral QID  . midodrine  10 mg Per Tube TID WC  . tiotropium  18 mcg Inhalation Daily  . tiZANidine  4 mg Oral TID   Continuous Infusions: . sodium chloride 100 mL/hr at 06/30/16 0524  . sodium chloride    . ceFEPime (MAXIPIME) IV Stopped (06/30/16 1119)     LOS: 0 days    Time spent: 35 minutes,    Tawyna Pellot, Cassie Freer, MD [facility] Pager [phone]  If 7PM-7AM, please contact night-coverage [email or web address] Password [email or web address] 06/30/2016, 12:00 PM

## 2016-06-30 NOTE — ED Provider Notes (Signed)
[facility] DEPT Provider Note   CSN: [id number] Arrival date & time: 06/30/16  0018  By signing my name below, I, Ny'Kea Lewis, attest that this documentation has been prepared under the direction and in the presence of Sherwood Gambler, MD. Electronically Signed: Lise Auer, ED Scribe. 06/30/16. 1:47 AM.  History   Chief Complaint Chief Complaint  Patient presents with  . Hematuria   The history is provided by the patient. No language interpreter was used.    HPI Comments: Angela Page is a 46 y.o. female with extensive past medical history including quadriparesis, pacemaker, trach and G-tube brought in by ambulance, who presents to the Emergency Department complaining of sudden onset hematuria tonight. She notes associated suprapubic abdominal pain, a fever of 100.5, and generally not felling well today. She also reports the presence of blood clots in her catheter. Pt notes she was discharged from the hospital on 6/8 and diagnosed with Hemorrhage Cystitis after experiencing various  and she was placed on an antibiotic that was discontinued at discharge. Denies nausea, emesis, diarrhea, cough, dizziness, or light headedness. No acute associated symptoms noted at this time.   Past Medical History:  Diagnosis Date  . Asthma   . GERD (gastroesophageal reflux disease)   . HCAP (healthcare-associated pneumonia) 06/09/2016  . Pacemaker   . Pleurisy   . Protein calorie malnutrition ([place]) 10/31/2015  . Quadriparesis ([place])   . Quadriplegia, C5-C7 incomplete ([place]) 10/08/2013  . Stage IV pressure ulcer of sacral region [facility]) 10/31/2015    Patient Active Problem List   Diagnosis Date Noted  . Sepsis secondary to UTI ([place]) 06/30/2016  . Acute hemorrhagic cystitis 06/23/2016  . Gross hematuria   . Autonomic neuropathy 06/10/2016  . Presence of permanent cardiac pacemaker 06/10/2016  . Neurogenic bladder 06/10/2016  . Chronically on opiate therapy 06/10/2016  . Indwelling Foley  catheter present 06/10/2016  . PEG (percutaneous endoscopic gastrostomy) status ([place]) 06/10/2016  . Pulmonary hypertension ([place]) 06/10/2016  . Anasarca 06/10/2016  . Hypoalbuminemia 06/10/2016  . Abnormal transaminases   . Distended abdomen   . Peripheral edema   . Tracheostomy dependence ([place])   . Chronic obstructive pulmonary disease ([place])   . Chronic diastolic CHF (congestive heart failure) ([place]) 01/23/2016  . Tracheostomy status ([place])   . Palliative care encounter   . Goals of care, counseling/discussion   . Esophageal dysphagia   . Shortness of breath   . Acute respiratory failure with hypoxemia ([place])   . Encounter for intubation   . Malnutrition of moderate degree 11/01/2015  . Acute encephalopathy 10/31/2015  . Tobacco abuse 10/31/2015  . Asthma 10/31/2015  . Pressure ulcer 02/26/2015  . HCAP (healthcare-associated pneumonia) 02/25/2015  . Lower urinary tract infectious disease 02/25/2015  . Hypotension 02/25/2015  . Anemia 02/25/2015  . Enterococcus UTI 02/03/2015  . Sepsis due to urinary tract infection ([place]) 01/31/2015  . RLL pneumonia ([place]) 01/31/2015  . Altered mental status 10/29/2014  . Encephalopathy, toxic 10/29/2014  . Toxic metabolic encephalopathy 29/93/7169  . Urinary tract infection due to Proteus 10/24/2014  . Leukocytosis 10/23/2014  . Hypokalemia 10/23/2014  . Decubitus ulcer of sacral region, stage 4 ([place]) 10/23/2014  . Anemia, iron deficiency 10/23/2014  . Severe sepsis ([place])   . Septic shock ([place]) 10/22/2014  . Quadriplegia, C5-C7 incomplete ([place]) 10/22/2014  . Depression   . Protein-calorie malnutrition, severe ([place]) 09/05/2014  . Overdose of opiate or related narcotic 09/02/2014    Past Surgical History:  Procedure Laterality Date  . APPENDECTOMY    .  BACK SURGERY  10/08/2013   fusion of spine, cervical region  . CARDIAC SURGERY    . GASTROSTOMY  11/23/2015  . I&D EXTREMITY  10/28/2011   Procedure: IRRIGATION AND DEBRIDEMENT EXTREMITY;   Surgeon: Tennis Must, MD;  Location: [facility];  Service: Orthopedics;  Laterality: Right;  Irrigation and debridement right middle finger  . IR GENERIC HISTORICAL  11/23/2015   IR GASTROSTOMY TUBE MOD SED 11/23/2015 [facility]-INTERV RAD  . TRACHEOSTOMY      OB History    No data available     Home Medications    Prior to Admission medications   Medication Sig Start Date End Date Taking? Authorizing Provider  acetaminophen (TYLENOL) 500 MG tablet Take 1,000 mg by mouth every 6 (six) hours as needed for mild pain, moderate pain, fever or headache.    Yes [provider]  albuterol (PROVENTIL) (2.5 MG/3ML) 0.083% nebulizer solution Take 2.5 mg by nebulization every 6 (six) hours as needed for wheezing or shortness of breath.   Yes [provider]  alum & mag hydroxide-simeth (MAALOX/MYLANTA) 200-200-20 MG/5ML suspension Take 30 mLs by mouth every 6 (six) hours as needed for indigestion or heartburn.   Yes [provider]  baclofen (LIORESAL) 20 MG tablet Take 20 mg by mouth 3 (three) times daily. 12/14/15  Yes [provider]  bisacodyl (DULCOLAX) 5 MG EC tablet Take 5 mg by mouth daily as needed for moderate constipation.   Yes [provider]  [employer] VITAMIN B12 1000 MCG tablet Place 1 tablet (1,000 mcg total) into feeding tube daily. 11/26/15  Yes Florencia Reasons, MD  DULoxetine (CYMBALTA) 30 MG capsule Take 30 mg by mouth 2 (two) times daily. 11/30/15  Yes [provider]  furosemide (LASIX) 20 MG tablet Take 2 tablets (40 mg total) by mouth daily. 06/13/16  Yes Lovenia Kim, MD  gabapentin (NEURONTIN) 400 MG capsule Take 400 mg by mouth 4 (four) times daily. 10/16/15  Yes [provider]  ibuprofen (ADVIL,MOTRIN) 200 MG tablet Place 2 tablets (400 mg total) into feeding tube every 6 (six) hours as needed for moderate pain. Patient taking differently: Place 400-600 mg into feeding tube every 6 (six) hours as needed for fever,  headache, mild pain, moderate pain or cramping.  11/26/15  Yes Florencia Reasons, MD  lidocaine (LIDODERM) 5 % Place 1 patch onto the skin 2 (two) times daily as needed (pain).  04/04/15  Yes [provider]  midodrine (PROAMATINE) 10 MG tablet Place 1 tablet (10 mg total) into feeding tube 3 (three) times daily with meals. 11/26/15  Yes Florencia Reasons, MD  neomycin-bacitracin-polymyxin (NEOSPORIN) ointment Apply 1 application topically as needed for wound care. apply to eye    Yes [provider]  Nutritional Supplements (FEEDING SUPPLEMENT, OSMOLITE 1.2 CAL,) LIQD Place 100 mLs into feeding tube See admin instructions. Given for 10 hours at a time.  Given between 2300 - 0900 the next day.   Yes [provider]  Nutritional Supplements (PROMOD) LIQD Take 30 mLs by mouth 2 (two) times daily.   Yes [provider]  Oxycodone HCl 10 MG TABS Take 10 mg by mouth every 4 (four) hours as needed (for pain).   Yes [provider]  OXYGEN Inhale 3 L into the lungs continuous.   Yes [provider]  polyethylene glycol (MIRALAX / GLYCOLAX) packet Take 17 g by mouth daily as needed (constipation).   Yes [provider]  tiotropium (SPIRIVA HANDIHALER) 18  MCG inhalation capsule Place 1 capsule (18 mcg total) into inhaler and inhale daily. 02/20/16  Yes Erick Colace, NP  tiZANidine (ZANAFLEX) 4 MG tablet Take 4 mg by mouth 3 (three) times daily. 11/30/15  Yes [provider]  Water For Irrigation, Sterile (FREE WATER) SOLN Place 100 mLs into feeding tube every 8 (eight) hours. 11/26/15  Yes Florencia Reasons, MD  zinc oxide (BALMEX) 11.3 % CREA cream Apply 1 application topically 3 (three) times daily as needed (for skin redness/irritation).   Yes [provider]  zolpidem (AMBIEN) 5 MG tablet Take 1 tablet (5 mg total) by mouth at bedtime as needed for sleep. 06/27/16  Yes Theodis Blaze, MD  Nutritional Supplements (FEEDING SUPPLEMENT, OSMOLITE 1.5 CAL,) LIQD  Place 1,000 mLs into feeding tube daily. Patient not taking: Reported on 06/23/2016 11/27/15   Florencia Reasons, MD   Family History Family History  Problem Relation Age of Onset  . Hypertension Maternal Uncle   . Kidney failure Maternal Uncle   . Colon cancer Mother   . Lung cancer Father   . Hypertension Brother   . Kidney failure Maternal Aunt   . Kidney failure Maternal Uncle    Social History Social History  Substance Use Topics  . Smoking status: Current Every Day Smoker    Packs/day: 1.00    Years: 25.00    Types: Cigarettes  . Smokeless tobacco: Never Used  . Alcohol use Yes     Comment: 2 x weekly    Allergies   Erythromycin; Nitrofurantoin monohyd macro; and Penicillins  Review of Systems Review of Systems  Constitutional: Positive for fever.  Respiratory: Negative for cough.   Gastrointestinal: Positive for abdominal pain. Negative for diarrhea, nausea and vomiting.  Genitourinary: Positive for hematuria.  Neurological: Positive for weakness. Negative for dizziness and light-headedness.  All other systems reviewed and are negative.  Physical Exam Updated Vital Signs BP (!) 87/55   Pulse 80   Temp 99.2 F (37.3 C) (Rectal)   Resp 13   Ht 5\' 3"  (1.6 m)   Wt 68 kg (150 lb)   LMP 05/27/2016 (Within Days) Comment: Irregular periods since October 2015.  SpO2 98%   BMI 26.57 kg/m   Physical Exam  Constitutional: She is oriented to person, place, and time. She appears well-developed and well-nourished.  HENT:  Head: Normocephalic and atraumatic.  Right Ear: External ear normal.  Left Ear: External ear normal.  Nose: Nose normal.  Eyes: Right eye exhibits no discharge. Left eye exhibits no discharge.  Neck:  Normal appearing. Tracheostomy.   Cardiovascular: Normal rate, regular rhythm and normal heart sounds.   Pulmonary/Chest: Effort normal and breath sounds normal.  Abdominal: Soft. There is no tenderness.  G-tube site without drainage or redness     Genitourinary:  Genitourinary Comments: Foley catheter with gross blood in the bag.   Neurological: She is alert and oriented to person, place, and time.  Skin: Skin is warm and dry. She is not diaphoretic.  Nursing note and vitals reviewed.   ED Treatments / Results  DIAGNOSTIC STUDIES: Oxygen Saturation is 95% on [place] 3L , adequate by my interpretation.   COORDINATION OF CARE: 12:30 AM-Discussed next steps with pt. Pt verbalized understanding and is agreeable with the plan.   Labs (all labs ordered are listed, but only abnormal results are displayed) Labs Reviewed  COMPREHENSIVE METABOLIC PANEL - Abnormal; Notable for the following:       Result Value   Chloride 92 (*)  CO2 37 (*)    BUN 24 (*)    Creatinine, Ser 0.39 (*)    Albumin 3.0 (*)    AST 43 (*)    Alkaline Phosphatase 146 (*)    All other components within normal limits  CBC WITH DIFFERENTIAL/PLATELET - Abnormal; Notable for the following:    WBC 15.8 (*)    RBC 2.54 (*)    Hemoglobin 7.9 (*)    HCT 24.6 (*)    Neutro Abs 12.5 (*)    Monocytes Absolute 1.1 (*)    All other components within normal limits  URINALYSIS, ROUTINE W REFLEX MICROSCOPIC - Abnormal; Notable for the following:    Color, Urine RED (*)    APPearance HAZY (*)    Specific Gravity, Urine 1.004 (*)    Hgb urine dipstick LARGE (*)    Protein, ur 100 (*)    Leukocytes, UA LARGE (*)    Bacteria, UA RARE (*)    Squamous Epithelial / LPF 0-5 (*)    All other components within normal limits  HEMATOCRIT - Abnormal; Notable for the following:    HCT 22.6 (*)    All other components within normal limits  HEMOGLOBIN - Abnormal; Notable for the following:    Hemoglobin 7.4 (*)    All other components within normal limits  CULTURE, BLOOD (ROUTINE X 2)  CULTURE, BLOOD (ROUTINE X 2)  URINE CULTURE  I-STAT CG4 LACTIC ACID, ED  I-STAT CG4 LACTIC ACID, ED  TYPE AND SCREEN    EKG  EKG Interpretation  Date/Time:  Monday June 30 2016  00:51:56 EDT Ventricular Rate:  80 PR Interval:    QRS Duration: 98 QT Interval:  407 QTC Calculation: 470 R Axis:   42 Text Interpretation:  Atrial-paced rhythm paced rhythm appears new compared to Jun 10 2016 Confirmed by Sherwood Gambler [phone]) on 06/30/2016 12:58:04 AM Also confirmed by Sherwood Gambler [phone]), editor Hattie Perch ([id number])  on 06/30/2016 7:18:49 AM       Radiology No results found.  Procedures Procedures (including critical care time)  CRITICAL CARE Performed by: Sherwood Gambler T   Total critical care time: 30 minutes  Critical care time was exclusive of separately billable procedures and treating other patients.  Critical care was necessary to treat or prevent imminent or life-threatening deterioration.  Critical care was time spent personally by me on the following activities: development of treatment plan with patient and/or surrogate as well as nursing, discussions with consultants, evaluation of patient's response to treatment, examination of patient, obtaining history from patient or surrogate, ordering and performing treatments and interventions, ordering and review of laboratory studies, ordering and review of radiographic studies, pulse oximetry and re-evaluation of patient's condition.   Medications Ordered in ED Medications  ceFEPIme (MAXIPIME) 2 g in dextrose 5 % 50 mL IVPB (not administered)  oxyCODONE (Oxy IR/ROXICODONE) immediate release tablet 10 mg (not administered)  tiotropium (SPIRIVA) inhalation capsule 18 mcg ( Inhalation Canceled Entry 06/30/16 1000)  0.9 %  sodium chloride infusion ( Intravenous New Bag/Given 06/30/16 0524)  sodium chloride 0.9 % bolus 1,000 mL (0 mLs Intravenous Stopped 06/30/16 0103)    And  sodium chloride 0.9 % bolus 1,000 mL (0 mLs Intravenous Stopped 06/30/16 0220)    And  sodium chloride 0.9 % bolus 250 mL (0 mLs Intravenous Stopped 06/30/16 0220)  ceFEPIme (MAXIPIME) 2 g in dextrose 5 % 50 mL IVPB (0 g  Intravenous Stopped 06/30/16 0220)  oxyCODONE (Oxy IR/ROXICODONE) immediate release tablet 10  mg (10 mg Oral Given 06/30/16 0448)     Initial Impression / Assessment and Plan / ED Course  I have reviewed the triage vital signs and the nursing notes.  Pertinent labs & imaging results that were available during my care of the patient were reviewed by me and considered in my medical decision making (see chart for details).     Code sepsis was called based on patient having a blood pressure under 80 systolic in the setting of a UTI. She is afebrile here but has been febrile at home. She states her blood pressure is typically low in the high 80s, low 90s. However today seems to be an acute change. She was resuscitated with IV fluids with good response. Her previous records were reviewed and her UTI from a few days ago had Pseudomonas but was sensitive to cefepime. This was given here in the ED along with fluids. She will be admitted to the hospitalist service.  Final Clinical Impressions(s) / ED Diagnoses   Final diagnoses:  Acute UTI  Sepsis secondary to UTI [facility])    New Prescriptions New Prescriptions   No medications on file  I personally performed the services described in this documentation, which was scribed in my presence. The recorded information has been reviewed and is accurate.     Sherwood Gambler, MD 06/30/16 [id number]

## 2016-06-30 NOTE — Progress Notes (Signed)
Initial Nutrition Assessment  DOCUMENTATION CODES:   Not applicable  INTERVENTION:  - Continue Osmolite 1.2 @ 100 mL/hr x10 hours (2300-0900) with 30 mL Prostat BID. This regimen provides 1400 kcal (97% minimum estimated kcal need), 86 grams of protein, and 1120 mL free water. - Continue free water flush per MD order. - RD will continue to monitor for additional needs/need to adjust TF regimen.   NUTRITION DIAGNOSIS:   Increased nutrient needs related to wound healing (stage 4 pressure injuries) as evidenced by estimated needs.  GOAL:   Patient will meet greater than or equal to 90% of their needs  MONITOR:   TF tolerance, Weight trends, Labs, Skin, I & O's  REASON FOR ASSESSMENT:   Consult Assessment of nutrition requirement/status  ASSESSMENT:   46 y.o. 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 He was recently admitted from 06/23/2016 until 06/27/2016 due to hemorrhagic cystitis.  Pt seen for consult. Pt familiar to this RD from previous admissions. Home TF regimen confirmed during previous admission and is currently ordered. Will monitor for need to adjust. Estimated needs based on weight at time of last admission (139 lbs, 63.18 kg) as weight has been trending up since May and since time of admission earlier this month. Pt does not consume anything PO and everything is via PEG.  Medications reviewed. Labs reviewed; Cl: 92 mmol/L, BUN: 24 mg/dL, creatinine: 0.39 mg/dL, Alk Phos/AST elevated.  IVF: NS @ 100 mL/hr.    Diet Order:  Diet NPO time specified  Skin:  Wound (see comment) (Stage 4 R ischial tuberosity and sacral pressure  injuries)  Last BM:  PTA/unknown  Height:   Ht Readings from Last 1 Encounters:  06/30/16 '5\' 3"'$  (1.6 m)    Weight:   Wt Readings from Last 1 Encounters:  06/30/16 150 lb (68 kg)    Ideal Body Weight:  47.04 kg  BMI:  Body mass index is 26.57 kg/m.  Estimated Nutritional Needs:   Kcal:  1450-1645 (23-26 kcal/kg)  Protein:  85-95 grams  Fluid:  >/= 1.7 L/day  EDUCATION NEEDS:   No education needs identified at this time    Jarome Matin, MS, RD, LDN, CNSC Inpatient Clinical Dietitian Pager # [phone] After hours/weekend pager # [phone]

## 2016-06-30 NOTE — ED Triage Notes (Signed)
Brought in by EMS from home with c/o hematuria.  Pt was recently hospitalized for hemorrhagic cystitis and was discharge home last Friday.  Pt reported that she started having bloody urine tonight---- pt on foley cath.

## 2016-06-30 NOTE — Progress Notes (Signed)
Pharmacy Antibiotic Note  Angela Page is a 46 y.o. female with hx of quadraplegia admitted on 06/30/2016 with UTI.  Pharmacy has been consulted for cefepime dosing.  Plan: Cefepime 2 Gm IV q12h F/u scr/cultures  Height: 5\' 3"  (160 cm) Weight: 150 lb (68 kg) IBW/kg (Calculated) : 52.4  Temp (24hrs), Avg:98.5 F (36.9 C), Min:98.5 F (36.9 C), Max:98.5 F (36.9 C)   Recent Labs Lab 06/23/16 1506 06/24/16 0256 06/25/16 0357 06/25/16 1441 06/26/16 0314 06/27/16 0449  WBC  --  10.5 7.5 12.0* 6.3 5.5  CREATININE  --  0.48 <0.30* 0.40* <0.30* <0.30*  LATICACIDVEN 1.34  --   --   --   --   --     CrCl cannot be calculated (This lab value cannot be used to calculate CrCl because it is not a number: <0.30).    Allergies  Allergen Reactions  . Erythromycin Nausea And Vomiting  . Nitrofurantoin Monohyd Macro Nausea And Vomiting  . Penicillins Hives    Has patient had a PCN reaction causing immediate rash, facial/tongue/throat swelling, SOB or lightheadedness with hypotension: Yes Has patient had a PCN reaction causing severe rash involving mucus membranes or skin necrosis: Yes Has patient had a PCN reaction that required hospitalization Yes- went to the DR may have been from amoxicillin Has patient had a PCN reaction occurring within the last 10 years: No-childhood allergy If all of the above answers are "NO", then may proceed with Cephalosporin     Antimicrobials this admission: 6/11 cefepime >>    >>   Dose adjustments this admission:   Microbiology results:  BCx:   UCx:    Sputum:    MRSA PCR:   Thank you for allowing pharmacy to be a part of this patient's care.  Dorrene German 06/30/2016 12:38 AM

## 2016-06-30 NOTE — Consult Note (Signed)
I have been asked to see the patient by Dr. Niel Hummer, MD, for evaluation and management of recurrent hematuria.  History of present illness: Unfortunate 45 year old quadriplegic who was recently hospitalized for a pseudomonal urinary tract infection with associated hematuria re-presented to the hospital yesterday evening with recurrence of her hematuria. The patient was also noted to be somewhat hypotensive. She was not febrile. She had been treated for her infection with meropenem.  In the ICU a 14 French Foley catheter was exchanged for an 57 [place] 3-way by the nursing staff and she was started on gentle CBI. By the time I evaluated the patient early this evening, her hematuria had largely resolved, her CBI was on a very slow drip, and her urine was clear. The patient is otherwise stable, denies any other complaints at this time.  Review of systems: A 12 point comprehensive review of systems was obtained and is negative unless otherwise stated in the history of present illness.  Patient Active Problem List   Diagnosis Date Noted  . Sepsis secondary to UTI ([place]) 06/30/2016  . Acute hemorrhagic cystitis 06/23/2016  . Gross hematuria   . Autonomic neuropathy 06/10/2016  . Presence of permanent cardiac pacemaker 06/10/2016  . Neurogenic bladder 06/10/2016  . Chronically on opiate therapy 06/10/2016  . Indwelling Foley catheter present 06/10/2016  . PEG (percutaneous endoscopic gastrostomy) status ([place]) 06/10/2016  . Pulmonary hypertension ([place]) 06/10/2016  . Anasarca 06/10/2016  . Hypoalbuminemia 06/10/2016  . Abnormal transaminases   . Distended abdomen   . Peripheral edema   . Tracheostomy dependence ([place])   . Chronic obstructive pulmonary disease ([place])   . Chronic diastolic CHF (congestive heart failure) ([place]) 01/23/2016  . Tracheostomy status ([place])   . Palliative care encounter   . Goals of care, counseling/discussion   . Esophageal dysphagia   . Shortness of breath   .  Acute respiratory failure with hypoxemia ([place])   . Encounter for intubation   . Malnutrition of moderate degree 11/01/2015  . Acute encephalopathy 10/31/2015  . Tobacco abuse 10/31/2015  . Asthma 10/31/2015  . Pressure ulcer 02/26/2015  . HCAP (healthcare-associated pneumonia) 02/25/2015  . Lower urinary tract infectious disease 02/25/2015  . Hypotension 02/25/2015  . Anemia 02/25/2015  . Enterococcus UTI 02/03/2015  . Sepsis due to urinary tract infection ([place]) 01/31/2015  . RLL pneumonia ([place]) 01/31/2015  . Altered mental status 10/29/2014  . Encephalopathy, toxic 10/29/2014  . Toxic metabolic encephalopathy 42/87/6811  . Urinary tract infection due to Proteus 10/24/2014  . Leukocytosis 10/23/2014  . Hypokalemia 10/23/2014  . Decubitus ulcer of sacral region, stage 4 ([place]) 10/23/2014  . Anemia, iron deficiency 10/23/2014  . Severe sepsis ([place])   . Septic shock ([place]) 10/22/2014  . Quadriplegia, C5-C7 incomplete ([place]) 10/22/2014  . Depression   . Protein-calorie malnutrition, severe ([place]) 09/05/2014  . Overdose of opiate or related narcotic 09/02/2014    No current facility-administered medications on file prior to encounter.    Current Outpatient Prescriptions on File Prior to Encounter  Medication Sig Dispense Refill  . acetaminophen (TYLENOL) 500 MG tablet Take 1,000 mg by mouth every 6 (six) hours as needed for mild pain, moderate pain, fever or headache.     . albuterol (PROVENTIL) (2.5 MG/3ML) 0.083% nebulizer solution Take 2.5 mg by nebulization every 6 (six) hours as needed for wheezing or shortness of breath.    Marland Kitchen alum & mag hydroxide-simeth (MAALOX/MYLANTA) 200-200-20 MG/5ML suspension Take 30 mLs by mouth every 6 (six) hours as needed  for indigestion or heartburn.    . baclofen (LIORESAL) 20 MG tablet Take 20 mg by mouth 3 (three) times daily.  3  . [employer] VITAMIN B12 1000 MCG tablet Place 1 tablet (1,000 mcg total) into feeding tube daily. 30 tablet 0  . DULoxetine  (CYMBALTA) 30 MG capsule Take 30 mg by mouth 2 (two) times daily.    . furosemide (LASIX) 20 MG tablet Take 2 tablets (40 mg total) by mouth daily. 30 tablet 2  . gabapentin (NEURONTIN) 400 MG capsule Take 400 mg by mouth 4 (four) times daily.  3  . ibuprofen (ADVIL,MOTRIN) 200 MG tablet Place 2 tablets (400 mg total) into feeding tube every 6 (six) hours as needed for moderate pain. (Patient taking differently: Place 400-600 mg into feeding tube every 6 (six) hours as needed for fever, headache, mild pain, moderate pain or cramping. ) 30 tablet 0  . lidocaine (LIDODERM) 5 % Place 1 patch onto the skin 2 (two) times daily as needed (pain).   0  . midodrine (PROAMATINE) 10 MG tablet Place 1 tablet (10 mg total) into feeding tube 3 (three) times daily with meals. 90 tablet 0  . neomycin-bacitracin-polymyxin (NEOSPORIN) ointment Apply 1 application topically as needed for wound care. apply to eye     . Nutritional Supplements (FEEDING SUPPLEMENT, OSMOLITE 1.2 CAL,) LIQD Place 100 mLs into feeding tube See admin instructions. Given for 10 hours at a time.  Given between 2300 - 0900 the next day.    . Nutritional Supplements (PROMOD) LIQD Take 30 mLs by mouth 2 (two) times daily.    . Oxycodone HCl 10 MG TABS Take 10 mg by mouth every 4 (four) hours as needed (for pain).    . OXYGEN Inhale 3 L into the lungs continuous.    . polyethylene glycol (MIRALAX / GLYCOLAX) packet Take 17 g by mouth daily as needed (constipation).    Marland Kitchen tiotropium (SPIRIVA HANDIHALER) 18 MCG inhalation capsule Place 1 capsule (18 mcg total) into inhaler and inhale daily. 30 capsule 12  . tiZANidine (ZANAFLEX) 4 MG tablet Take 4 mg by mouth 3 (three) times daily.    . Water For Irrigation, Sterile (FREE WATER) SOLN Place 100 mLs into feeding tube every 8 (eight) hours. 1000 mL 0  . zinc oxide (BALMEX) 11.3 % CREA cream Apply 1 application topically 3 (three) times daily as needed (for skin redness/irritation).    Marland Kitchen zolpidem (AMBIEN)  5 MG tablet Take 1 tablet (5 mg total) by mouth at bedtime as needed for sleep. 30 tablet 2  . Nutritional Supplements (FEEDING SUPPLEMENT, OSMOLITE 1.5 CAL,) LIQD Place 1,000 mLs into feeding tube daily. (Patient not taking: Reported on 06/23/2016) 1000 mL 0    Past Medical History:  Diagnosis Date  . Asthma   . GERD (gastroesophageal reflux disease)   . HCAP (healthcare-associated pneumonia) 06/09/2016  . Pacemaker   . Pleurisy   . Protein calorie malnutrition ([place]) 10/31/2015  . Quadriparesis ([place])   . Quadriplegia, C5-C7 incomplete ([place]) 10/08/2013  . Stage IV pressure ulcer of sacral region ([place]) 10/31/2015    Past Surgical History:  Procedure Laterality Date  . APPENDECTOMY    . BACK SURGERY  10/08/2013   fusion of spine, cervical region  . CARDIAC SURGERY    . GASTROSTOMY  11/23/2015  . I&D EXTREMITY  10/28/2011   Procedure: IRRIGATION AND DEBRIDEMENT EXTREMITY;  Surgeon: Tennis Must, MD;  Location: [facility];  Service: Orthopedics;  Laterality: Right;  Irrigation and debridement right middle finger  . IR GENERIC HISTORICAL  11/23/2015   IR GASTROSTOMY TUBE MOD SED 11/23/2015 [facility]-INTERV RAD  . TRACHEOSTOMY      Social History  Substance Use Topics  . Smoking status: Current Every Day Smoker    Packs/day: 1.00    Years: 25.00    Types: Cigarettes  . Smokeless tobacco: Never Used  . Alcohol use Yes     Comment: 2 x weekly    Family History  Problem Relation Age of Onset  . Hypertension Maternal Uncle   . Kidney failure Maternal Uncle   . Colon cancer Mother   . Lung cancer Father   . Hypertension Brother   . Kidney failure Maternal Aunt   . Kidney failure Maternal Uncle     PE: Vitals:   06/30/16 1745 06/30/16 1800 06/30/16 1815 06/30/16 1855  BP: 124/79 126/68 139/62 125/80  Pulse: 80 80 80 80  Resp: 17 16 16 17   Temp: 98.3 F (36.8 C)  98.4 F (36.9 C) 99.2 F (37.3 C)  TempSrc: Oral  Oral Oral  SpO2: 98% 96% 94% 96%  Weight:       Height:       Patient appears to be in no acute distress  patient is alert and oriented x3 Atraumatic normocephalic head Tracheostomy in place No increased work of breathing, no audible wheezes/rhonchi Regular sinus rhythm/rate Abdomen is soft, nontender, nondistended, no CVA or suprapubic tenderness An 74 French through a Foley catheter in the patient's bladder, CBI drip is slow, urine output or irrigant is yellow. Lower extremities are symmetric without appreciable edema Grossly neurologically intact No identifiable skin lesions   Recent Labs  06/30/16 0043 06/30/16 0632  WBC 15.8*  --   HGB 7.9* 7.4*  HCT 24.6* 22.6*    Recent Labs  06/30/16 0043  NA 139  K 3.8  CL 92*  CO2 37*  GLUCOSE 83  BUN 24*  CREATININE 0.39*  CALCIUM 9.3   No results for input(s): LABPT, INR in the last 72 hours. No results for input(s): LABURIN in the last 72 hours. Results for orders placed or performed during the hospital encounter of 06/30/16  MRSA PCR Screening     Status: None   Collection Time: 06/30/16  2:00 PM  Result Value Ref Range Status   MRSA by PCR NEGATIVE NEGATIVE Final    Comment:        The GeneXpert MRSA Assay (FDA approved for NASAL specimens only), is one component of a comprehensive MRSA colonization surveillance program. It is not intended to diagnose MRSA infection nor to guide or monitor treatment for MRSA infections.     Imaging: I have reviewed the patient's renal ultrasound which was performed at the patient's last hospital admission.  Imp/Recommendations:: The patient returned with recurrent hematuria, this quickly resolved. I suspect this is residual urothelial damage from her pseudomonal urinary tract infection/cystitis. She has follow-up scheduled with Dr. Diona Fanti and at this point requires no further evaluation or workup. I would continue to treat her infection with meropenem. The CBI is currently off, I will check back tomorrow to ensure  that her hematuria has resolved. She can be discharged home with his 67 French catheter with the irrigation port capped.    Thank you for involving me in this patient's care, I will continue to follow along.  Louis Meckel W

## 2016-06-30 NOTE — H&P (Signed)
History and Physical    Angela Page QQP:[id number] DOB: May 01, 1970 DOA: 06/30/2016  PCP: System, Provider Not In   Patient coming from: Home.  I have personally briefly reviewed patient's old medical records in [place]  Chief Complaint: Blood in the urine.  HPI: Angela Page is a 46 y.o. 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 He was recently admitted from 06/23/2016 until 06/27/2016 due to hemorrhagic cystitis. She denies headache, dyspnea, productive cough, dizziness, emesis or diarrhea. She has frequent constipation. Her most recent urine culture showed pseudomonas aeruginosa resistant to ciprofloxacin and gentamicin. In the past, she has had MDR Acinetobacter UTI.  ED Course: The patient received 20-50 mL NS bolus and cefepime 2 g IVPB experiencing relief. She subsequently was given oxycodone 10 mg by mouth. She declined continuous bladder irrigation.   Urinalysis showed red color with large hemoglobin, mild proteinuria, large leukocyte esterase and negative nitrates. Urine and blood cultures were taken. Her WBC was 15.9 with 78% neutrophils, hemoglobin 7.9 mg/dL and platelets 344. Sodium 139, potassium 3.8, chloride 92, bicarbonate 37 lactic acid 0.67 mmol/L. BUN was 24, creatinine 0.39 and glucose 83 mg/dL. . EKG now shows an atrial paced rhythm.  Review of Systems: As per HPI otherwise 10 point review of systems negative.    Past Medical History:  Diagnosis Date  . Asthma   . GERD (gastroesophageal reflux disease)   . HCAP (healthcare-associated pneumonia) 06/09/2016  . Pacemaker   . Pleurisy   . Protein calorie malnutrition  ([place]) 10/31/2015  . Quadriparesis ([place])   . Quadriplegia, C5-C7 incomplete ([place]) 10/08/2013  . Stage IV pressure ulcer of sacral region ([place]) 10/31/2015    Past Surgical History:  Procedure Laterality Date  . APPENDECTOMY    . BACK SURGERY  10/08/2013   fusion of spine, cervical region  . CARDIAC SURGERY    . GASTROSTOMY  11/23/2015  . I&D EXTREMITY  10/28/2011   Procedure: IRRIGATION AND DEBRIDEMENT EXTREMITY;  Surgeon: Tennis Must, MD;  Location: [place];  Service: Orthopedics;  Laterality: Right;  Irrigation and debridement right middle finger  . IR GENERIC HISTORICAL  11/23/2015   IR GASTROSTOMY TUBE MOD SED 11/23/2015 [facility]-INTERV RAD  . TRACHEOSTOMY       reports that she has been smoking Cigarettes.  She has a 25.00 pack-year smoking history. She has never used smokeless tobacco. She reports that she drinks alcohol. She reports that she does not use drugs.  Allergies  Allergen Reactions  . Erythromycin Nausea And Vomiting  . Nitrofurantoin Monohyd Macro Nausea And Vomiting  . Penicillins Hives    Has patient had a PCN reaction causing immediate rash, facial/tongue/throat swelling, SOB or lightheadedness with hypotension: Yes Has patient had a PCN reaction causing severe rash involving mucus membranes or skin necrosis: Yes Has patient had a PCN reaction that required hospitalization Yes- went to the DR may have been from amoxicillin Has patient had a PCN reaction occurring within the last 10 years: No-childhood allergy If all of the above answers are "NO", then may proceed with Cephalosporin     Family History  Problem Relation Age of Onset  . Hypertension Maternal Uncle   . Kidney failure Maternal Uncle   .  Colon cancer Mother   . Lung cancer Father   . Hypertension Brother   . Kidney failure Maternal Aunt   . Kidney failure Maternal Uncle     Prior to Admission medications   Medication Sig Start Date End Date Taking? Authorizing Provider    acetaminophen (TYLENOL) 500 MG tablet Take 1,000 mg by mouth every 6 (six) hours as needed for mild pain, moderate pain, fever or headache.    Yes [provider]  albuterol (PROVENTIL) (2.5 MG/3ML) 0.083% nebulizer solution Take 2.5 mg by nebulization every 6 (six) hours as needed for wheezing or shortness of breath.   Yes [provider]  alum & mag hydroxide-simeth (MAALOX/MYLANTA) 200-200-20 MG/5ML suspension Take 30 mLs by mouth every 6 (six) hours as needed for indigestion or heartburn.   Yes [provider]  baclofen (LIORESAL) 20 MG tablet Take 20 mg by mouth 3 (three) times daily. 12/14/15  Yes [provider]  bisacodyl (DULCOLAX) 5 MG EC tablet Take 5 mg by mouth daily as needed for moderate constipation.   Yes [provider]  [employer] VITAMIN B12 1000 MCG tablet Place 1 tablet (1,000 mcg total) into feeding tube daily. 11/26/15  Yes Florencia Reasons, MD  DULoxetine (CYMBALTA) 30 MG capsule Take 30 mg by mouth 2 (two) times daily. 11/30/15  Yes [provider]  furosemide (LASIX) 20 MG tablet Take 2 tablets (40 mg total) by mouth daily. 06/13/16  Yes Lovenia Kim, MD  gabapentin (NEURONTIN) 400 MG capsule Take 400 mg by mouth 4 (four) times daily. 10/16/15  Yes [provider]  ibuprofen (ADVIL,MOTRIN) 200 MG tablet Place 2 tablets (400 mg total) into feeding tube every 6 (six) hours as needed for moderate pain. Patient taking differently: Place 400-600 mg into feeding tube every 6 (six) hours as needed for fever, headache, mild pain, moderate pain or cramping.  11/26/15  Yes Florencia Reasons, MD  lidocaine (LIDODERM) 5 % Place 1 patch onto the skin 2 (two) times daily as needed (pain).  04/04/15  Yes [provider]  midodrine (PROAMATINE) 10 MG tablet Place 1 tablet (10 mg total) into feeding tube 3 (three) times daily with meals. 11/26/15  Yes Florencia Reasons, MD  neomycin-bacitracin-polymyxin (NEOSPORIN) ointment Apply 1 application topically as  needed for wound care. apply to eye    Yes [provider]  Nutritional Supplements (FEEDING SUPPLEMENT, OSMOLITE 1.2 CAL,) LIQD Place 100 mLs into feeding tube See admin instructions. Given for 10 hours at a time.  Given between 2300 - 0900 the next day.   Yes [provider]  Nutritional Supplements (PROMOD) LIQD Take 30 mLs by mouth 2 (two) times daily.   Yes [provider]  Oxycodone HCl 10 MG TABS Take 10 mg by mouth every 4 (four) hours as needed (for pain).   Yes [provider]  OXYGEN Inhale 3 L into the lungs continuous.   Yes [provider]  polyethylene glycol (MIRALAX / GLYCOLAX) packet Take 17 g by mouth daily as needed (constipation).   Yes [provider]  tiotropium (SPIRIVA HANDIHALER) 18 MCG inhalation capsule Place 1 capsule (18 mcg total) into inhaler and inhale daily. 02/20/16  Yes Erick Colace, NP  tiZANidine (ZANAFLEX) 4 MG tablet Take 4 mg by mouth 3 (three) times daily. 11/30/15  Yes [provider]  Water For Irrigation, Sterile (FREE WATER) SOLN Place 100 mLs into feeding tube every 8 (eight) hours. 11/26/15  Yes Florencia Reasons, MD  zinc oxide (  BALMEX) 11.3 % CREA cream Apply 1 application topically 3 (three) times daily as needed (for skin redness/irritation).   Yes [provider]  zolpidem (AMBIEN) 5 MG tablet Take 1 tablet (5 mg total) by mouth at bedtime as needed for sleep. 06/27/16  Yes Theodis Blaze, MD  Nutritional Supplements (FEEDING SUPPLEMENT, OSMOLITE 1.5 CAL,) LIQD Place 1,000 mLs into feeding tube daily. Patient not taking: Reported on 06/23/2016 11/27/15   Florencia Reasons, MD    Physical Exam: Vitals:   06/30/16 0035 06/30/16 0106 06/30/16 0118 06/30/16 0200  BP:   122/88 (!) 86/63  Pulse:   80 79  Resp:   18 17  Temp:  99.2 F (37.3 C)    TempSrc:  Rectal    SpO2:   98% 98%  Weight: 68 kg (150 lb)     Height: 5\' 3"  (1.6 m)       Constitutional: NAD, calm, comfortable Eyes: PERRL,  lids and conjunctivae normal ENMT: Mucous membranes and lips are dry. Posterior pharynx clear of any exudate or lesions.Dentition in poor state of repair. Neck: Positive tracheostomy, supple, no masses, no thyromegaly Respiratory: clear to auscultation bilaterally, no wheezing, no crackles. Normal respiratory effort. No accessory muscle use.  Cardiovascular: Regular rate and rhythm, no murmurs / rubs / gallops. No extremity edema. 2+ pedal pulses. No carotid bruits.  Abdomen: Positive PEG, soft no tenderness, no masses palpated. No hepatosplenomegaly. Bowel sounds positive.  Musculoskeletal: no clubbing / cyanosis. Good ROM, spastic contractures of the extremities.  Skin: no rashes, lesions, ulcers. No induration Neurologic: CN 2-12 grossly intact. Minimal sensation of upper extremities. Spastic quadriplegia, Strength 0/5 in all 4.  Psychiatric: Normal judgment and insight. Alert and oriented x 3.    Labs on Admission: I have personally reviewed following labs and imaging studies  CBC:  Recent Labs Lab 06/23/16 1500 06/24/16 0256 06/25/16 0357 06/25/16 1441 06/26/16 0314 06/27/16 0449 06/30/16 0043  WBC 16.0* 10.5 7.5 12.0* 6.3 5.5 15.8*  NEUTROABS 11.6* 6.8  --   --   --   --  12.5*  HGB 10.9* 10.2* 7.9* 9.9* 8.2* 7.4* 7.9*  HCT 33.0* 31.0* 24.4* 31.2* 25.3* 23.1* 24.6*  MCV 94.0 94.5 98.4 98.1 96.2 97.9 96.9  PLT 425* 340 245 315 257 273 703   Basic Metabolic Panel:  Recent Labs Lab 06/25/16 0357 06/25/16 1441 06/26/16 0314 06/27/16 0449 06/30/16 0043  NA 141 141 141 142 139  K 4.0 4.4 4.1 4.1 3.8  CL 101 102 104 102 92*  CO2 36* 32 33* 34* 37*  GLUCOSE 164* 119* 157* 145* 83  BUN 19 17 14 16  24*  CREATININE <0.30* 0.40* <0.30* <0.30* 0.39*  CALCIUM 8.8* 9.0 8.9 9.2 9.3   GFR: Estimated Creatinine Clearance: 82.2 mL/min (A) (by C-G formula based on SCr of 0.39 mg/dL (L)). Liver Function Tests:  Recent Labs Lab 06/23/16 1500 06/30/16 0043  AST 38 43*  ALT  35 50  ALKPHOS 114 146*  BILITOT 0.2* 0.4  PROT 7.6 7.1  ALBUMIN 3.7 3.0*   No results for input(s): LIPASE, AMYLASE in the last 168 hours. No results for input(s): AMMONIA in the last 168 hours. Coagulation Profile:  Recent Labs Lab 06/23/16 1530  INR 0.92   Cardiac Enzymes: No results for input(s): CKTOTAL, CKMB, CKMBINDEX, TROPONINI in the last 168 hours. BNP (last 3 results) No results for input(s): PROBNP in the last 8760 hours. HbA1C: No results for input(s): HGBA1C in the last 72 hours. CBG:  Recent Labs Lab 06/25/16 1511 06/26/16 0745 06/26/16 1151 06/26/16 1619 06/27/16 0818  GLUCAP 135* 164* 121* 119* 174*   Lipid Profile: No results for input(s): CHOL, HDL, LDLCALC, TRIG, CHOLHDL, LDLDIRECT in the last 72 hours. Thyroid Function Tests: No results for input(s): TSH, T4TOTAL, FREET4, T3FREE, THYROIDAB in the last 72 hours. Anemia Panel: No results for input(s): VITAMINB12, FOLATE, FERRITIN, TIBC, IRON, RETICCTPCT in the last 72 hours. Urine analysis:    Component Value Date/Time   COLORURINE RED (A) 06/30/2016 0106   APPEARANCEUR HAZY (A) 06/30/2016 0106   LABSPEC 1.004 (L) 06/30/2016 0106   PHURINE 6.0 06/30/2016 0106   GLUCOSEU NEGATIVE 06/30/2016 0106   HGBUR LARGE (A) 06/30/2016 0106   BILIRUBINUR NEGATIVE 06/30/2016 0106   KETONESUR NEGATIVE 06/30/2016 0106   PROTEINUR 100 (A) 06/30/2016 0106   UROBILINOGEN 0.2 10/29/2014 1521   NITRITE NEGATIVE 06/30/2016 0106   LEUKOCYTESUR LARGE (A) 06/30/2016 0106    Radiological Exams on Admission: No results found.  EKG: Independently reviewed. Vent. rate 80 BPM PR interval * ms QRS duration 98 ms QT/QTc 407/470 ms P-R-T axes * 42 47 Atrial-paced rhythm paced rhythm appears new compared to Jun 10 2016 Will repeat in the morning.  Assessment/Plan Principal Problem:   Sepsis secondary to UTI ([place])   Acute hemorrhagic cystitis Admit to stepdown/inpatient. Continue IV fluids. Continue IV  antibiotics. The patient declined CBI. Follow-up blood culture and sensitivity. Follow-up urine culture and sensitivity. She is scheduled to follow-up with urology as an outpatient.  Active Problems:   Quadriplegia, C5-C7 incomplete ([place]) Supportive care. Continue analgesics and muscle relaxants.    Decubitus ulcer of sacral region, stage 4 ([place]) Continue local care. Consult wound care nurse.    Anemia, iron deficiency Monitor hematocrit and hemoglobin. Transfuse as needed. May need to resume iron supplementation.    Hypotension Per patient, her baseline BP is low.  However, she is having clinical symptoms of urosepsis. Continue midodrine. Continue IV fluids and IV antibiotics.    Tracheostomy status (HCC) Continue supplemental oxygen. Continue tracheostomy care per RT services.    Hypoalbuminemia Consult nutritional services.       DVT prophylaxis: SCDs. Code Status: Full code. Family Communication: Her night shift caregiver was present in the ED. Disposition Plan: Admit for IV antibiotic therapy for several days. Consults called:  Admission status: Inpatient/stepdown.   Reubin Milan MD [facility] Pager [phone].  If 7PM-7AM, please contact night-coverage [email or web address] Password [facility]  06/30/2016, 2:44 AM

## 2016-06-30 NOTE — Consult Note (Signed)
[place] Nurse wound consult note Reason for Consult: Patient known to the [place] [facility] team.  Last seen by my partner, D. Engels 6 days ago for sacral and bilateral ischial tuberosity All Pressure Injuries are a Stage 4 (healing). Fungal overgrowth treated last admission with antifungal powder has resolved. Seen periodically by the outpatient [facility] at [place]. Wound type:Pressure Pressure Injury POA: Yes Measurement:  Sacral Pressure Injury:  0.4cm x 3.5cm x 1cm.  This ulcer presents as deep crevice surrounded by dry scar tissue, palpable bone, moderate amount of light yellow drainage. Patient used hydrofera blue as an antimicrobial dressing to this wound.  As that product is not available in our formulary, I will substitute another antimicrobial (silver hydrofiber) in its place.  Patient is amenable to this POC. Left ischial tuberosity pressure injury: Dry scar tissue which during the healing process has created a deep crevice without wound or drainage/exudate.  Right ischial tuberosity pressure injury: Chronic wound, recently closed. Dry scar with very small opening measuring 0.1cm x 0.1cm x 0.1cm in center. Scant drainage. Wound bed: Drainage (amount, consistency, odor) As described above Periwound: Intact with evidence of healing (scarring, contracture) Dressing procedure/placement/frequency: I will provide Pressure Redistribution heel boots today, also wound care orders for once daily care using an antimicrobial dressing topped with dry dressing and silicone foam. Turning and repositioning guidance is provided.  As patient is in the ICU, she is on a therapeutic mattress with low air loss feature.  When she transfer to floor we will provide the same. [place] nursing team will not follow, but will remain available to this patient, the nursing and medical teams.  Please re-consult if needed. Thanks, Maudie Flakes, MSN, RN, [place], Arther Abbott  Pager# [phone]

## 2016-06-30 NOTE — ED Notes (Signed)
First set of blood cultures obtained 82ml each bottle left forearm.

## 2016-06-30 NOTE — Care Management (Addendum)
ED CM noted patient to have been recently admitted to [facility] 6/4 for hemorraghic cystis. Patient was discharge home 6/6 with [facility] services. Patient has an  extensive past medical history Spastic Quadriplegia trach/PEG/ Indwelling Suprapubic Cath, Sacral Decub.Patient presented to [facility] ED today with c/o gross hematuria, Temp 100.5. WBC 15.6. ED evaluation r/o sepsis. Patient being readmitted to stepdown. CM notified [facility] [phone] of readmission. Unit CM will continue to follow for care transitional needs .

## 2016-07-01 LAB — TYPE AND SCREEN
ABO/RH(D): A POS
ANTIBODY SCREEN: NEGATIVE
UNIT DIVISION: 0

## 2016-07-01 LAB — CBC WITH DIFFERENTIAL/PLATELET
Basophils Absolute: 0 10*3/uL (ref 0.0–0.1)
Basophils Relative: 0 %
Eosinophils Absolute: 0.4 10*3/uL (ref 0.0–0.7)
Eosinophils Relative: 3 %
HEMATOCRIT: 28.6 % — AB (ref 36.0–46.0)
HEMOGLOBIN: 9.2 g/dL — AB (ref 12.0–15.0)
LYMPHS ABS: 1.8 10*3/uL (ref 0.7–4.0)
LYMPHS PCT: 16 %
MCH: 30.8 pg (ref 26.0–34.0)
MCHC: 32.2 g/dL (ref 30.0–36.0)
MCV: 95.7 fL (ref 78.0–100.0)
Monocytes Absolute: 1.1 10*3/uL — ABNORMAL HIGH (ref 0.1–1.0)
Monocytes Relative: 10 %
NEUTROS ABS: 7.7 10*3/uL (ref 1.7–7.7)
Neutrophils Relative %: 71 %
Platelets: 327 10*3/uL (ref 150–400)
RBC: 2.99 MIL/uL — AB (ref 3.87–5.11)
RDW: 15.1 % (ref 11.5–15.5)
WBC: 10.9 10*3/uL — AB (ref 4.0–10.5)

## 2016-07-01 LAB — BPAM RBC
BLOOD PRODUCT EXPIRATION DATE: 201806202359
ISSUE DATE / TIME: 201806111737
UNIT TYPE AND RH: 6200

## 2016-07-01 LAB — URINE CULTURE

## 2016-07-01 LAB — BASIC METABOLIC PANEL
Anion gap: 7 (ref 5–15)
BUN: 15 mg/dL (ref 6–20)
CHLORIDE: 102 mmol/L (ref 101–111)
CO2: 32 mmol/L (ref 22–32)
Calcium: 9.3 mg/dL (ref 8.9–10.3)
Creatinine, Ser: 0.33 mg/dL — ABNORMAL LOW (ref 0.44–1.00)
GFR calc Af Amer: >60 mL/min (ref >60)
GFR calc non Af Amer: >60 mL/min (ref >60)
Glucose, Bld: 156 mg/dL — ABNORMAL HIGH (ref 65–99)
POTASSIUM: 4.1 mmol/L (ref 3.5–5.1)
Sodium: 141 mmol/L (ref 135–145)

## 2016-07-01 MED ORDER — LORAZEPAM 2 MG/ML IJ SOLN
0.5000 mg | Freq: Two times a day (BID) | INTRAMUSCULAR | Status: DC | PRN
  Administered 2016-07-01 – 2016-07-02 (×3): 0.5 mg via INTRAVENOUS
  Filled 2016-07-01 (×3): qty 1

## 2016-07-01 MED ORDER — LORAZEPAM 0.5 MG PO TABS
0.5000 mg | ORAL_TABLET | Freq: Once | ORAL | Status: AC
  Administered 2016-07-01: 0.5 mg
  Filled 2016-07-01: qty 1

## 2016-07-01 NOTE — Care Management Note (Signed)
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 He was recently admitted from 06/23/2016 until 06/27/2016 due to hemorrhagic cystitis.  Her most recent urine culture showed pseudomonas aeruginosa resistant to ciprofloxacin and gentamicin. In the past, she has had MDR Acinetobacter UTI.  Action/Plan: Date:  July 01, 2016 Chart reviewed for concurrent status and case management needs. Will continue to follow patient progress. Discharge Planning: following for needs Expected discharge date: [id number] Velva Harman, BSN, [place], [place]  Expected Discharge Date:   (unknown)               Expected Discharge Plan:  [place]  In-House Referral:     Discharge planning Services  CM Consult  Post Acute Care Choice:    Choice offered to:     DME Arranged:    DME Agency:     HH Arranged:    [place]:  [place]  Status of Service:  In process, will continue to follow  If discussed at Long Length of Stay Meetings, dates discussed:    Additional Comments:  Leeroy Cha, RN 07/01/2016, 8:35 AM

## 2016-07-01 NOTE — Progress Notes (Signed)
Subjective: Patient reports feeling better.  Urine in bag is clear/yellow.  CBIs off.   Objective: Vital signs in last 24 hours: Temp:  [98.2 F (36.8 C)-99.5 F (37.5 C)] 99.4 F (37.4 C) (06/12 0750) Pulse Rate:  [80-88] 85 (06/12 0600) Resp:  [16-24] 20 (06/12 0600) BP: (85-139)/(53-80) 100/56 (06/12 0600) SpO2:  [94 %-98 %] 96 % (06/12 0600)  Intake/Output from previous day: 06/11 0701 - 06/12 0700 In: 1473.3 [I.V.:1060; Blood:363.3; IV Piggyback:50] Out: 2800 [Urine:2800] Intake/Output this shift: No intake/output data recorded.  Physical Exam:  General:alert, cooperative and no distress GI: soft, non tender, no palpable masses, no organomegaly 3 way foley in place with clear/ yellow urine in bag.   Lab Results:  Recent Labs  06/30/16 0043 06/30/16 0632 07/01/16 0329  HGB 7.9* 7.4* 9.2*  HCT 24.6* 22.6* 28.6*   BMET  Recent Labs  06/30/16 0043 07/01/16 0329  NA 139 141  K 3.8 4.1  CL 92* 102  CO2 37* 32  GLUCOSE 83 156*  BUN 24* 15  CREATININE 0.39* 0.33*  CALCIUM 9.3 9.3   No results for input(s): LABPT, INR in the last 72 hours. No results for input(s): LABURIN in the last 72 hours. Results for orders placed or performed during the hospital encounter of 06/30/16  Urine culture     Status: Abnormal   Collection Time: 06/30/16  1:06 AM  Result Value Ref Range Status   Specimen Description URINE, RANDOM  Final   Special Requests NONE  Final   Culture MULTIPLE SPECIES PRESENT, SUGGEST RECOLLECTION (A)  Final   Report Status 07/01/2016 FINAL  Final  MRSA PCR Screening     Status: None   Collection Time: 06/30/16  2:00 PM  Result Value Ref Range Status   MRSA by PCR NEGATIVE NEGATIVE Final    Comment:        The GeneXpert MRSA Assay (FDA approved for NASAL specimens only), is one component of a comprehensive MRSA colonization surveillance program. It is not intended to diagnose MRSA infection nor to guide or monitor treatment for MRSA  infections.   Culture, respiratory (NON-Expectorated)     Status: None (Preliminary result)   Collection Time: 06/30/16 11:30 PM  Result Value Ref Range Status   Specimen Description   Final    TRACHEAL ASPIRATE Performed at [facility], [place] [address] [place], [place] [address]    Special Requests NONE  Final   Gram Stain PENDING  Incomplete   Culture PENDING  Incomplete   Report Status PENDING  Incomplete    Studies/Results: Dg Chest Port 1 View  Result Date: 06/30/2016 CLINICAL DATA:  Cough.  Quadriplegic with tracheostomy tube. EXAM: PORTABLE CHEST 1 VIEW COMPARISON:  06/09/2016 FINDINGS: Stable position of the left dual chamber cardiac pacemaker. Heart size is within normal limits and stable. Patient is slightly rotated towards the right on these images. Tracheostomy tube is present. Surgical hardware in the lower cervical spine. Persistent densities at the right chest base compatible with volume loss and consolidation. Cannot exclude right pleural fluid. There is concern for increased densities at the left lung base. Upper lungs remain clear. IMPRESSION: Bibasilar chest disease with progression at the left lung base. Findings could represent a combination of atelectasis and airspace disease. There is also concern for some right pleural fluid. Electronically Signed   By: Markus Daft M.D.   On: 06/30/2016 12:21    Assessment/Plan:     UTI--improving.  Continue Abx and follow up  urine culture.  Repeat UA at end of treatment to ensure resolution of infection.    Hematuria--resolved.  Hand irrigate foley prn.    Keep f/u with Dr. Diona Fanti    LOS: 1 day   [facility], [facility] 07/01/2016, 11:02 AM

## 2016-07-01 NOTE — Progress Notes (Addendum)
PROGRESS NOTE    Angela Page  SFK:[id number] DOB: 04/08/1970 DOA: 06/30/2016 PCP: System, Provider Not In    Brief Narrative: Angela Page is a 46 y.o. 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 He was recently admitted from 06/23/2016 until 06/27/2016 due to hemorrhagic cystitis.  Her most recent urine culture showed pseudomonas aeruginosa resistant to ciprofloxacin and gentamicin. In the past, she has had MDR Acinetobacter UTI.  Patient readmitted with hemorrhagic cystitis. Patient will likely need long course of antibiotics.    Assessment & Plan:   Principal Problem:   Sepsis secondary to UTI [facility]) Active Problems:   Quadriplegia, C5-C7 incomplete ([place])   Decubitus ulcer of sacral region, stage 4 (HCC)   Anemia, iron deficiency   Hypotension   Tracheostomy status (HCC)   Hypoalbuminemia   Acute hemorrhagic cystitis  Sepsis secondary to UTI and chronic foley catheter  NSL to avoid overload.  IV antibiotics, cefepime day 2.  WBC at 15 on admission.  Follow urine culture.   Anemia;  S/P one unit of PRBC 6-11. HB stable.   Acute hemorrhagic cystitis; in patient with chronic foley catheter/  Recurrent.  Prior pseudomonas in Urine culture.  Received 4 days of antibiotics last admission (6-08) Treated with continuous bladder irrigation.  Follow repeated urine culture.  Continue with IV cefepime.  Appreciate Urology evaluation.   PNA;  Report increase secretion from trach, chest   Xray with atelectasis vs infiltrates.  Continue with cefepime.  Sputum culture.  Incentive spirometry   Anxiety; PRN ativan.   Quadriplegia,  C5-C7 incomplete ([place]) after mechanical fall 2015 Decubitus ulcer of sacral region, stage 4 (HCC) Wound care consulted.   Chronic hypotension.  Worse in setting of infection and anemia; stable.  Received IV fluids. .  Continue with Midodrine.   Tracheostomy status (HCC) Continue supplemental oxygen. Continue tracheostomy care per RT services. She report increase secretion from trach.   DVT prophylaxis: scd Code Status: full code.  Family Communication: care discussed with patient  Disposition Plan: remain in the step down unit   Consultants:   none   Procedures:   none   Antimicrobials:   Cefepime.    Subjective: She is feeling ok, she is asking for ativan for anxiety. She gets very anxious in the hospital     Objective: Vitals:   07/01/16 0400 07/01/16 0500 07/01/16 0519 07/01/16 0600  BP: (!) 96/59   (!) 100/56  Pulse: 88 80  85  Resp: (!) 24 19  20   Temp:   99.2 F (37.3 C)   TempSrc:   Oral   SpO2: 95% 97%  96%  Weight:      Height:        Intake/Output Summary (Last 24 hours) at 07/01/16 0731 Last data filed at 07/01/16 0600  Gross per 24 hour  Intake          1473.33 ml  Output             2800 ml  Net         -1326.67 ml   Filed Weights   06/30/16 0035  Weight: 68 kg (150 lb)    Examination:  General exam: Quadriplegic, chronic trach, No acute distress.  Respiratory system:  Normal respiratory effort, no wheezing.  Cardiovascular system: S1 & S2 heard, RRR. No JVD, murmurs, rubs,  Gastrointestinal system: BS present, soft, mild suprapubic tenderness, has chronic foley catheter  Central nervous system: Alert and oriented. Quadriplegic.  Extremities: no edema Skin: decubitus ulcer not evaluated.    Data Reviewed: I have personally reviewed following labs and imaging studies  CBC:  Recent Labs Lab 06/25/16 1441 06/26/16 0314 06/27/16 0449 06/30/16 0043 06/30/16 0632 07/01/16 0329  WBC 12.0* 6.3 5.5 15.8*  --  10.9*  NEUTROABS   --   --   --  12.5*  --  7.7  HGB 9.9* 8.2* 7.4* 7.9* 7.4* 9.2*  HCT 31.2* 25.3* 23.1* 24.6* 22.6* 28.6*  MCV 98.1 96.2 97.9 96.9  --  95.7  PLT 315 257 273 344  --  485   Basic Metabolic Panel:  Recent Labs Lab 06/25/16 1441 06/26/16 0314 06/27/16 0449 06/30/16 0043 07/01/16 0329  NA 141 141 142 139 141  K 4.4 4.1 4.1 3.8 4.1  CL 102 104 102 92* 102  CO2 32 33* 34* 37* 32  GLUCOSE 119* 157* 145* 83 156*  BUN 17 14 16  24* 15  CREATININE 0.40* <0.30* <0.30* 0.39* 0.33*  CALCIUM 9.0 8.9 9.2 9.3 9.3   GFR: Estimated Creatinine Clearance: 82.2 mL/min (A) (by C-G formula based on SCr of 0.33 mg/dL (L)). Liver Function Tests:  Recent Labs Lab 06/30/16 0043  AST 43*  ALT 50  ALKPHOS 146*  BILITOT 0.4  PROT 7.1  ALBUMIN 3.0*   No results for input(s): LIPASE, AMYLASE in the last 168 hours. No results for input(s): AMMONIA in the last 168 hours. Coagulation Profile: No results for input(s): INR, PROTIME in the last 168 hours. Cardiac Enzymes: No results for input(s): CKTOTAL, CKMB, CKMBINDEX, TROPONINI in the last 168 hours. BNP (last 3 results) No results for input(s): PROBNP in the last 8760 hours. HbA1C: No results for input(s): HGBA1C in the last 72 hours. CBG:  Recent Labs Lab 06/25/16 1511 06/26/16 0745 06/26/16 1151 06/26/16 1619 06/27/16 0818  GLUCAP 135* 164* 121* 119* 174*   Lipid Profile: No results for input(s): CHOL, HDL, LDLCALC, TRIG, CHOLHDL, LDLDIRECT in the last 72 hours. Thyroid Function Tests: No results for input(s): TSH, T4TOTAL, FREET4, T3FREE, THYROIDAB in the last 72 hours. Anemia Panel: No results for input(s): VITAMINB12, FOLATE, FERRITIN, TIBC, IRON, RETICCTPCT in the last 72 hours. Sepsis Labs:  Recent Labs Lab 06/30/16 0056 06/30/16 0357  LATICACIDVEN 0.67 0.50    Recent Results (from the past 240 hour(s))  Urine culture     Status: Abnormal   Collection Time: 06/23/16  1:55 PM  Result Value Ref Range Status    Specimen Description URINE, CLEAN CATCH  Final   Special Requests Normal  Final   Culture >=100,000 COLONIES/mL PSEUDOMONAS AERUGINOSA (A)  Final   Report Status 06/26/2016 FINAL  Final   Organism ID, Bacteria PSEUDOMONAS AERUGINOSA (A)  Final      Susceptibility   Pseudomonas aeruginosa - MIC*    CEFTAZIDIME 2 SENSITIVE Sensitive     CIPROFLOXACIN >=4 RESISTANT Resistant     GENTAMICIN >=16 RESISTANT Resistant     IMIPENEM <=0.25 SENSITIVE Sensitive     PIP/TAZO <=4 SENSITIVE Sensitive     CEFEPIME 2 SENSITIVE Sensitive     * >=100,000 COLONIES/mL PSEUDOMONAS AERUGINOSA  MRSA PCR Screening     Status: None   Collection Time: 06/30/16  2:00 PM  Result Value Ref Range Status  MRSA by PCR NEGATIVE NEGATIVE Final    Comment:        The GeneXpert MRSA Assay (FDA approved for NASAL specimens only), is one component of a comprehensive MRSA colonization surveillance program. It is not intended to diagnose MRSA infection nor to guide or monitor treatment for MRSA infections.          Radiology Studies: Dg Chest Port 1 View  Result Date: 06/30/2016 CLINICAL DATA:  Cough.  Quadriplegic with tracheostomy tube. EXAM: PORTABLE CHEST 1 VIEW COMPARISON:  06/09/2016 FINDINGS: Stable position of the left dual chamber cardiac pacemaker. Heart size is within normal limits and stable. Patient is slightly rotated towards the right on these images. Tracheostomy tube is present. Surgical hardware in the lower cervical spine. Persistent densities at the right chest base compatible with volume loss and consolidation. Cannot exclude right pleural fluid. There is concern for increased densities at the left lung base. Upper lungs remain clear. IMPRESSION: Bibasilar chest disease with progression at the left lung base. Findings could represent a combination of atelectasis and airspace disease. There is also concern for some right pleural fluid. Electronically Signed   By: Markus Daft M.D.   On: 06/30/2016  12:21        Scheduled Meds: . baclofen  20 mg Oral TID  . DULoxetine  30 mg Oral BID  . feeding supplement (PRO-STAT SUGAR FREE 64)  30 mL Oral BID  . free water  100 mL Per Tube Q8H  . gabapentin  400 mg Oral QID  . midodrine  10 mg Per Tube TID WC  . tiotropium  18 mcg Inhalation Daily  . tiZANidine  4 mg Oral TID   Continuous Infusions: . sodium chloride    . ceFEPime (MAXIPIME) IV Stopped (06/30/16 2306)  . feeding supplement (OSMOLITE 1.2 CAL) 1,000 mL (07/01/16 0348)     LOS: 1 day    Time spent: 35 minutes,    Levia Waltermire, Cassie Freer, MD [facility] Pager [phone]  If 7PM-7AM, please contact night-coverage [email or web address] Password [facility] 07/01/2016, 7:31 AM

## 2016-07-02 ENCOUNTER — Encounter: Payer: Medicare Other | Admitting: Internal Medicine

## 2016-07-02 DIAGNOSIS — L89154 Pressure ulcer of sacral region, stage 4: Secondary | ICD-10-CM

## 2016-07-02 LAB — BASIC METABOLIC PANEL
Anion gap: 10 (ref 5–15)
BUN: 15 mg/dL (ref 6–20)
CHLORIDE: 98 mmol/L — AB (ref 101–111)
CO2: 34 mmol/L — AB (ref 22–32)
Calcium: 9.9 mg/dL (ref 8.9–10.3)
Creatinine, Ser: <0.3 mg/dL — ABNORMAL LOW (ref 0.44–1.00)
GLUCOSE: 148 mg/dL — AB (ref 65–99)
POTASSIUM: 4.5 mmol/L (ref 3.5–5.1)
Sodium: 142 mmol/L (ref 135–145)

## 2016-07-02 LAB — URINALYSIS, ROUTINE W REFLEX MICROSCOPIC
Bacteria, UA: NONE SEEN
Bilirubin Urine: NEGATIVE
GLUCOSE, UA: NEGATIVE mg/dL
Hgb urine dipstick: NEGATIVE
KETONES UR: NEGATIVE mg/dL
Nitrite: NEGATIVE
PROTEIN: NEGATIVE mg/dL
SQUAMOUS EPITHELIAL / LPF: NONE SEEN
Specific Gravity, Urine: 1.01 (ref 1.005–1.030)
pH: 7 (ref 5.0–8.0)

## 2016-07-02 LAB — CBC WITH DIFFERENTIAL/PLATELET
Basophils Absolute: 0 10*3/uL (ref 0.0–0.1)
Basophils Relative: 0 %
Eosinophils Absolute: 0.6 10*3/uL (ref 0.0–0.7)
Eosinophils Relative: 7 %
HCT: 31.8 % — ABNORMAL LOW (ref 36.0–46.0)
HEMOGLOBIN: 10.6 g/dL — AB (ref 12.0–15.0)
LYMPHS ABS: 1.8 10*3/uL (ref 0.7–4.0)
LYMPHS PCT: 21 %
MCH: 31.2 pg (ref 26.0–34.0)
MCHC: 33.3 g/dL (ref 30.0–36.0)
MCV: 93.5 fL (ref 78.0–100.0)
Monocytes Absolute: 0.9 10*3/uL (ref 0.1–1.0)
Monocytes Relative: 11 %
NEUTROS PCT: 61 %
Neutro Abs: 5.1 10*3/uL (ref 1.7–7.7)
Platelets: 304 10*3/uL (ref 150–400)
RBC: 3.4 MIL/uL — ABNORMAL LOW (ref 3.87–5.11)
RDW: 14.3 % (ref 11.5–15.5)
WBC: 8.4 10*3/uL (ref 4.0–10.5)

## 2016-07-02 MED ORDER — FUROSEMIDE 40 MG PO TABS
40.0000 mg | ORAL_TABLET | Freq: Every day | ORAL | Status: DC
  Administered 2016-07-02 – 2016-07-03 (×2): 40 mg via ORAL
  Filled 2016-07-02 (×2): qty 1

## 2016-07-02 MED ORDER — BISACODYL 5 MG PO TBEC
10.0000 mg | DELAYED_RELEASE_TABLET | Freq: Every day | ORAL | Status: DC | PRN
  Administered 2016-07-02: 10 mg via ORAL
  Filled 2016-07-02: qty 2

## 2016-07-02 MED ORDER — DOCUSATE SODIUM 100 MG PO CAPS: 200.0000 mg | ORAL_CAPSULE | Freq: Every day | ORAL | Status: DC

## 2016-07-02 NOTE — Progress Notes (Signed)
Pt transferred to floor, room 1334. No distress noted, c/o pain, will medicate at appropriate time. Valuables at bedside.

## 2016-07-02 NOTE — Progress Notes (Signed)
  MD paged for hypertension.

## 2016-07-02 NOTE — Progress Notes (Signed)
   07/02/16 1444  Vitals  BP (!) 160/97  MAP (mmHg) 113  Pulse Rate 80  ECG Heart Rate 80  Resp 15  Oxygen Therapy  SpO2 95 %   Pt BP increased due to foley being kinked to collect UA.  BP began to drop to WDL once foley able to flow freely.  Pt resting comfortably.  Will continue to monitor.  Iantha Fallen RN 2:45 PM 07/02/2016

## 2016-07-02 NOTE — Progress Notes (Signed)
Trach check done. No breakdown noted. Trach secure. Pt requested for no drain sponge.

## 2016-07-02 NOTE — Progress Notes (Signed)
PROGRESS NOTE  Angela Page RUE:[id number] DOB: 1970-06-11 DOA: 06/30/2016 PCP: System, Provider Not In  HPI/Recap of past 45 hours: 46 year old female past mental history of pacemaker, protein calorie malnutrition, quadriplegia status post PEG tube and tracheostomy placement with stage IV decubitus ulcer admitted on 6/11 for sepsis secondary to UTI. Patient had been recently treated for hemorrhagic cystitis and has a history of chronic Foley with recurrent UTIs including recently MDR Acinetobacter and most recently, Pseudomonas. Started on cefepime. Placed in stepdown unit. Also seen by urology.  Sepsis since stabilized. Urine cultures note multiple bacteria, but no distinct bacteria. Patient clinically overall improving. Patient herself has no complaints. Denies any abdominal pain, dizziness, breathing comfortably.  Assessment/Plan: Principal Problem:   Sepsis secondary to UTI [facility]): Patient met criteria for sepsis on admission given tachycardia, leukocytosis and hypotension. She was treated with IV antibiotics and IV fluids and sepsis has since stabilized. We'll repeat urinalysis. White count now normalized If normal, then to discharge home Active Problems:   Quadriplegia, C5-C7 incomplete ([place]) with secondary Decubitus ulcer of sacral region, stage 4 [facility]): Appreciate wound care help. Present on admission   Anemia, iron deficiency: On admission patient status post 1 unit packed red blood cells. No evidence of blood loss.   Hypotension: Secondary to sepsis. Resolved.   Tracheostomy status ([place]): Continued on supplemental oxygen now with minimal suction. Respiratory therapy following.   Hypoalbuminemia: On tube feeds, Osmolite. Appreciate ejection help.   Acute hemorrhagic cystitis: With IV fluids, hematuria since resolved. Urology following.   Code Status: Full code   Family Communication: Left message with mother   Disposition Plan: Clinically improving. We'll transfer to floor with  potential discharge in next 24 hours.    Consultants:  Urology  Wound care   Procedures:  Transfuse 1 unit packed red blood cells 6/11  Antimicrobials:  IV cefepime 6/11-present   DVT prophylaxis: SCDs   Objective: Vitals:   07/02/16 0600 07/02/16 0756 07/02/16 0800 07/02/16 1118  BP: 110/63  (!) 110/59 98/65  Pulse: 80 80 80 80  Resp: '17 18 18 '$ (!) 23  Temp:   98.5 F (36.9 C)   TempSrc:   Oral   SpO2: 99% 97% 99% 99%  Weight:      Height:        Intake/Output Summary (Last 24 hours) at 07/02/16 1229 Last data filed at 07/02/16 0600  Gross per 24 hour  Intake          1183.33 ml  Output             1885 ml  Net          -701.67 ml   Filed Weights   06/30/16 0035  Weight: 68 kg (150 lb)    Exam:   General:  Awake, alert and oriented 3, no acute distress  HEENT: Normocephalic, atraumatic, mucous membranes are moist, tracheostomy in place  Cardiovascular: Regular rate and rhythm, J4-N8, 2/6 systolic ejection murmur   Respiratory: Clear to auscultation bilaterally, no heavy exertion   Abdomen: Soft, nontender, nondistended , noted PEG tube in place  Musculoskeletal: Chronic quadriplegia, trace edema   Skin: Stage IV decubitus ulcer, covered with dressing. Chronic bilateral ischial tuberosity pressure injury  Psychiatry: Patient is appropriate, no evidence of psychoses   Data Reviewed: CBC:  Recent Labs Lab 06/26/16 0314 06/27/16 0449 06/30/16 0043 06/30/16 0632 07/01/16 0329 07/02/16 0307  WBC 6.3 5.5 15.8*  --  10.9* 8.4  NEUTROABS  --   --  12.5*  --  7.7 5.1  HGB 8.2* 7.4* 7.9* 7.4* 9.2* 10.6*  HCT 25.3* 23.1* 24.6* 22.6* 28.6* 31.8*  MCV 96.2 97.9 96.9  --  95.7 93.5  PLT 257 273 344  --  327 580   Basic Metabolic Panel:  Recent Labs Lab 06/26/16 0314 06/27/16 0449 06/30/16 0043 07/01/16 0329 07/02/16 0307  NA 141 142 139 141 142  K 4.1 4.1 3.8 4.1 4.5  CL 104 102 92* 102 98*  CO2 33* 34* 37* 32 34*  GLUCOSE 157* 145*  83 156* 148*  BUN 14 16 24* 15 15  CREATININE <0.30* <0.30* 0.39* 0.33* <0.30*  CALCIUM 8.9 9.2 9.3 9.3 9.9   GFR: CrCl cannot be calculated (This lab value cannot be used to calculate CrCl because it is not a number: <0.30). Liver Function Tests:  Recent Labs Lab 06/30/16 0043  AST 43*  ALT 50  ALKPHOS 146*  BILITOT 0.4  PROT 7.1  ALBUMIN 3.0*   No results for input(s): LIPASE, AMYLASE in the last 168 hours. No results for input(s): AMMONIA in the last 168 hours. Coagulation Profile: No results for input(s): INR, PROTIME in the last 168 hours. Cardiac Enzymes: No results for input(s): CKTOTAL, CKMB, CKMBINDEX, TROPONINI in the last 168 hours. BNP (last 3 results) No results for input(s): PROBNP in the last 8760 hours. HbA1C: No results for input(s): HGBA1C in the last 72 hours. CBG:  Recent Labs Lab 06/25/16 1511 06/26/16 0745 06/26/16 1151 06/26/16 1619 06/27/16 0818  GLUCAP 135* 164* 121* 119* 174*   Lipid Profile: No results for input(s): CHOL, HDL, LDLCALC, TRIG, CHOLHDL, LDLDIRECT in the last 72 hours. Thyroid Function Tests: No results for input(s): TSH, T4TOTAL, FREET4, T3FREE, THYROIDAB in the last 72 hours. Anemia Panel: No results for input(s): VITAMINB12, FOLATE, FERRITIN, TIBC, IRON, RETICCTPCT in the last 72 hours. Urine analysis:    Component Value Date/Time   COLORURINE RED (A) 06/30/2016 0106   APPEARANCEUR HAZY (A) 06/30/2016 0106   LABSPEC 1.004 (L) 06/30/2016 0106   PHURINE 6.0 06/30/2016 0106   GLUCOSEU NEGATIVE 06/30/2016 0106   HGBUR LARGE (A) 06/30/2016 0106   BILIRUBINUR NEGATIVE 06/30/2016 0106   KETONESUR NEGATIVE 06/30/2016 0106   PROTEINUR 100 (A) 06/30/2016 0106   UROBILINOGEN 0.2 10/29/2014 1521   NITRITE NEGATIVE 06/30/2016 0106   LEUKOCYTESUR LARGE (A) 06/30/2016 0106   Sepsis Labs: '@LABRCNTIP'$ (procalcitonin:4,lacticidven:4)  ) Recent Results (from the past 240 hour(s))  Urine culture     Status: Abnormal   Collection  Time: 06/23/16  1:55 PM  Result Value Ref Range Status   Specimen Description URINE, CLEAN CATCH  Final   Special Requests Normal  Final   Culture >=100,000 COLONIES/mL PSEUDOMONAS AERUGINOSA (A)  Final   Report Status 06/26/2016 FINAL  Final   Organism ID, Bacteria PSEUDOMONAS AERUGINOSA (A)  Final      Susceptibility   Pseudomonas aeruginosa - MIC*    CEFTAZIDIME 2 SENSITIVE Sensitive     CIPROFLOXACIN >=4 RESISTANT Resistant     GENTAMICIN >=16 RESISTANT Resistant     IMIPENEM <=0.25 SENSITIVE Sensitive     PIP/TAZO <=4 SENSITIVE Sensitive     CEFEPIME 2 SENSITIVE Sensitive     * >=100,000 COLONIES/mL PSEUDOMONAS AERUGINOSA  Blood Culture (routine x 2)     Status: None (Preliminary result)   Collection Time: 06/30/16 12:46 AM  Result Value Ref Range Status   Specimen Description BLOOD BLOOD LEFT FOREARM  Final   Special Requests   Final  BOTTLES DRAWN AEROBIC AND ANAEROBIC Blood Culture adequate volume   Culture   Final    NO GROWTH 1 DAY Performed at [facility], [place] [address] [place], [place] [address]    Report Status PENDING  Incomplete  Blood Culture (routine x 2)     Status: None (Preliminary result)   Collection Time: 06/30/16 12:53 AM  Result Value Ref Range Status   Specimen Description BLOOD RIGHT WRIST  Final   Special Requests   Final    BOTTLES DRAWN AEROBIC AND ANAEROBIC Blood Culture adequate volume   Culture   Final    NO GROWTH 1 DAY Performed at [facility], [place] [address], [place], [place] [address]    Report Status PENDING  Incomplete  Urine culture     Status: Abnormal   Collection Time: 06/30/16  1:06 AM  Result Value Ref Range Status   Specimen Description URINE, RANDOM  Final   Special Requests NONE  Final   Culture MULTIPLE SPECIES PRESENT, SUGGEST RECOLLECTION (A)  Final   Report Status 07/01/2016 FINAL  Final  MRSA PCR Screening     Status: None   Collection Time: 06/30/16  2:00 PM  Result Value Ref Range Status   MRSA by  PCR NEGATIVE NEGATIVE Final    Comment:        The GeneXpert MRSA Assay (FDA approved for NASAL specimens only), is one component of a comprehensive MRSA colonization surveillance program. It is not intended to diagnose MRSA infection nor to guide or monitor treatment for MRSA infections.   Culture, respiratory (NON-Expectorated)     Status: None (Preliminary result)   Collection Time: 06/30/16 11:30 PM  Result Value Ref Range Status   Specimen Description TRACHEAL ASPIRATE  Final   Special Requests NONE  Final   Gram Stain   Final    MODERATE WBC PRESENT,BOTH PMN AND MONONUCLEAR MODERATE GRAM VARIABLE ROD RARE GRAM POSITIVE COCCI IN PAIRS    Culture   Final    CULTURE REINCUBATED FOR BETTER GROWTH Performed at [facility], [place] [address], [place], [place] [address]    Report Status PENDING  Incomplete      Studies: No results found.  Scheduled Meds: . baclofen  20 mg Oral TID  . DULoxetine  30 mg Oral BID  . feeding supplement (PRO-STAT SUGAR FREE 64)  30 mL Oral BID  . free water  100 mL Per Tube Q8H  . gabapentin  400 mg Oral QID  . midodrine  10 mg Per Tube TID WC  . tiotropium  18 mcg Inhalation Daily  . tiZANidine  4 mg Oral TID    Continuous Infusions: . sodium chloride    . ceFEPime (MAXIPIME) IV Stopped (07/02/16 1031)  . feeding supplement (OSMOLITE 1.2 CAL) Stopped (07/02/16 0932)     LOS: 2 days    Annita Brod, MD [facility] Pager [phone]  If 7PM-7AM, please contact night-coverage [email or web address] Password [facility] 07/02/2016, 12:29 PM

## 2016-07-03 DIAGNOSIS — N390 Urinary tract infection, site not specified: Secondary | ICD-10-CM

## 2016-07-03 DIAGNOSIS — I9589 Other hypotension: Secondary | ICD-10-CM

## 2016-07-03 DIAGNOSIS — A419 Sepsis, unspecified organism: Secondary | ICD-10-CM

## 2016-07-03 DIAGNOSIS — N3001 Acute cystitis with hematuria: Secondary | ICD-10-CM

## 2016-07-03 DIAGNOSIS — D509 Iron deficiency anemia, unspecified: Secondary | ICD-10-CM

## 2016-07-03 DIAGNOSIS — G8254 Quadriplegia, C5-C7 incomplete: Secondary | ICD-10-CM

## 2016-07-03 DIAGNOSIS — Z930 Tracheostomy status: Secondary | ICD-10-CM

## 2016-07-03 LAB — CULTURE, RESPIRATORY: CULTURE: NORMAL

## 2016-07-03 LAB — URINE CULTURE: CULTURE: NO GROWTH

## 2016-07-03 LAB — CULTURE, RESPIRATORY W GRAM STAIN

## 2016-07-03 LAB — GLUCOSE, CAPILLARY
GLUCOSE-CAPILLARY: 101 mg/dL — AB (ref 65–99)
GLUCOSE-CAPILLARY: 167 mg/dL — AB (ref 65–99)

## 2016-07-03 NOTE — Progress Notes (Signed)
Discharge instructions reviewed with patient, patient verbalized understanding and to be transported home via [place]. [facility] has been notified.

## 2016-07-03 NOTE — Progress Notes (Addendum)
This CM contacted for [facility] transport home for pt. Medical Necessity Form filled out and printed along with demographic sheet. Pt mother contacted to alert of pt transport home and home address confirmed with mother.  Pt states that she has home 02 and tube feeding equipment at home. [employer] rep alerted of discharge. Marney Doctor RN,BSN,NCM [phone]

## 2016-07-03 NOTE — Discharge Summary (Signed)
Physician Discharge Summary  Angela Page YPP:[id number] DOB: 1970/07/02 DOA: 06/30/2016  PCP: System, Provider Not In  Admit date: 06/30/2016 Discharge date: 07/03/2016  Time spent: 35 minutes  Recommendations for Outpatient Follow-up:  1. Follow-up urologyIn 1 week   Discharge Diagnoses:  Principal Problem:   Sepsis secondary to UTI [facility]) Active Problems:   Quadriplegia, C5-C7 incomplete (HCC)   Decubitus ulcer of sacral region, stage 4 (HCC)   Anemia, iron deficiency   Hypotension   Tracheostomy status (HCC)   Hypoalbuminemia   Acute hemorrhagic cystitis   Discharge Condition: Stable  Diet recommendation: Regular diet  Filed Weights   06/30/16 0035 07/02/16 1654  Weight: 68 kg (150 lb) 69.5 kg (153 lb 3.5 oz)    History of present illness:  46 year old female past mental history of pacemaker, protein calorie malnutrition, quadriplegia status post PEG tube and tracheostomy placement with stage IV decubitus ulcer admitted on 6/11 for sepsis secondary to UTI. Patient had been recently treated for hemorrhagic cystitis and has a history of chronic Foley with recurrent UTIs including recently MDR Acinetobacter and most recently, Pseudomonas. Started on cefepime. Placed in stepdown unit. Also seen by urology.  Sepsis since stabilized. Urine cultures note multiple bacteria, but no distinct bacteria. Patient clinically overall improving. Patient herself has no complaints. Denies any abdominal pain, dizziness, breathing comfortably.  Hospital Course:   Sepsis due to UTI- resolved, patient was admitted for sepsis due to UTI started on IV cefepime. She has completed 4 days of IV antibiotics. WBC down to 8000. Urine culture has only grew multiple organisms. She is afebrile.  ? UTI versus chronic colonization- patient was diagnosed with UTI leading to this admission on 06/23/2016 at that time urine culture grew Pseudomonas aeruginosa. Patient only received 4 days of meropenem and was  discharged home without antibiotics as was felt by infectious disease that patient had chronic colonization and no infection. During this hospitalization when patient presented with hematuria, she was again started on IV cefepime. Repeat UA is clear. Urine culture only grew multiple organisms. I called and discussed with infectious disease on-call Dr. Michel Bickers who recommends no antibiotics at the time of discharge.  Hematuria- patient presented with hematuria, urology was consulted. Likely induced with chronic Foley catheter .At this time hematuria has resolved. Patient to follow-up with Dr. Dorina Hoyer in 1 week as outpatient  Quadriplegia- C5-7, incomplete with secondary decubitus ulcer of sacral lesion- wound care was consulted. Continue local wound care.  Tracheostomy status-continue supplement oxygen.  Anemia- iron deficiency, patient status post 1 unit PRBC. No evidence of blood loss. Hemoglobin was 10.6 on 07/02/2016.      Procedures:    Consultations:  Urology  Discharge Exam: Vitals:   07/03/16 0616 07/03/16 0700  BP: (!) 97/52   Pulse: 80 80  Resp: 18 18  Temp: 98.9 F (37.2 C)     General: Appears in no acute distress Cardiovascular: S1-S2 regular Respiratory: Clear to auscultation bilaterally  Discharge Instructions   Discharge Instructions    Diet - low sodium heart healthy    Complete by:  As directed    Increase activity slowly    Complete by:  As directed      Current Discharge Medication List    CONTINUE these medications which have NOT CHANGED   Details  acetaminophen (TYLENOL) 500 MG tablet Take 1,000 mg by mouth every 6 (six) hours as needed for mild pain, moderate pain, fever or headache.     albuterol (PROVENTIL) (2.5 MG/3ML) 0.083%  nebulizer solution Take 2.5 mg by nebulization every 6 (six) hours as needed for wheezing or shortness of breath.    alum & mag hydroxide-simeth (MAALOX/MYLANTA) 200-200-20 MG/5ML suspension Take 30 mLs by  mouth every 6 (six) hours as needed for indigestion or heartburn.    baclofen (LIORESAL) 20 MG tablet Take 20 mg by mouth 3 (three) times daily. Refills: 3    bisacodyl (DULCOLAX) 5 MG EC tablet Take 5 mg by mouth daily as needed for moderate constipation.    [employer] VITAMIN B12 1000 MCG tablet Place 1 tablet (1,000 mcg total) into feeding tube daily. Qty: 30 tablet, Refills: 0    DULoxetine (CYMBALTA) 30 MG capsule Take 30 mg by mouth 2 (two) times daily.    furosemide (LASIX) 20 MG tablet Take 2 tablets (40 mg total) by mouth daily. Qty: 30 tablet, Refills: 2    gabapentin (NEURONTIN) 400 MG capsule Take 400 mg by mouth 4 (four) times daily. Refills: 3    ibuprofen (ADVIL,MOTRIN) 200 MG tablet Place 2 tablets (400 mg total) into feeding tube every 6 (six) hours as needed for moderate pain. Qty: 30 tablet, Refills: 0    lidocaine (LIDODERM) 5 % Place 1 patch onto the skin 2 (two) times daily as needed (pain).  Refills: 0    midodrine (PROAMATINE) 10 MG tablet Place 1 tablet (10 mg total) into feeding tube 3 (three) times daily with meals. Qty: 90 tablet, Refills: 0    neomycin-bacitracin-polymyxin (NEOSPORIN) ointment Apply 1 application topically as needed for wound care. apply to eye     !! Nutritional Supplements (FEEDING SUPPLEMENT, OSMOLITE 1.2 CAL,) LIQD Place 100 mLs into feeding tube See admin instructions. Given for 10 hours at a time.  Given between 2300 - 0900 the next day.    !! Nutritional Supplements (PROMOD) LIQD Take 30 mLs by mouth 2 (two) times daily.    Oxycodone HCl 10 MG TABS Take 10 mg by mouth every 4 (four) hours as needed (for pain).    OXYGEN Inhale 3 L into the lungs continuous.    polyethylene glycol (MIRALAX / GLYCOLAX) packet Take 17 g by mouth daily as needed (constipation).    tiotropium (SPIRIVA HANDIHALER) 18 MCG inhalation capsule Place 1 capsule (18 mcg total) into inhaler and inhale daily. Qty: 30 capsule, Refills: 12    tiZANidine  (ZANAFLEX) 4 MG tablet Take 4 mg by mouth 3 (three) times daily.    Water For Irrigation, Sterile (FREE WATER) SOLN Place 100 mLs into feeding tube every 8 (eight) hours. Qty: 1000 mL, Refills: 0    zinc oxide (BALMEX) 11.3 % CREA cream Apply 1 application topically 3 (three) times daily as needed (for skin redness/irritation).    zolpidem (AMBIEN) 5 MG tablet Take 1 tablet (5 mg total) by mouth at bedtime as needed for sleep. Qty: 30 tablet, Refills: 2    !! Nutritional Supplements (FEEDING SUPPLEMENT, OSMOLITE 1.5 CAL,) LIQD Place 1,000 mLs into feeding tube daily. Qty: 1000 mL, Refills: 0     !! - Potential duplicate medications found. Please discuss with provider.     Allergies  Allergen Reactions  . Erythromycin Nausea And Vomiting  . Nitrofurantoin Monohyd Macro Nausea And Vomiting  . Penicillins Hives    Has patient had a PCN reaction causing immediate rash, facial/tongue/throat swelling, SOB or lightheadedness with hypotension: Yes Has patient had a PCN reaction causing severe rash involving mucus membranes or skin necrosis: Yes Has patient had a PCN reaction that required hospitalization  Yes- went to the DR may have been from amoxicillin Has patient had a PCN reaction occurring within the last 10 years: No-childhood allergy If all of the above answers are "NO", then may proceed with Cephalosporin       The results of significant diagnostics from this hospitalization (including imaging, microbiology, ancillary and laboratory) are listed below for reference.    Significant Diagnostic Studies: Ct Angio Chest Pe W Or Wo Contrast  Result Date: 06/09/2016 CLINICAL DATA:  Acute onset of dyspnea.  Initial encounter. EXAM: CT ANGIOGRAPHY CHEST WITH CONTRAST TECHNIQUE: Multidetector CT imaging of the chest was performed using the standard protocol during bolus administration of intravenous contrast. Multiplanar CT image reconstructions and MIPs were obtained to evaluate the vascular  anatomy. CONTRAST:  100 mL of Isovue 370 IV contrast COMPARISON:  CTA of the chest performed 01/19/2016, and chest radiograph performed earlier today at 2:27 p.m. FINDINGS: Cardiovascular:  There is no evidence of pulmonary embolus. The heart is borderline normal in size. Pacemaker leads are noted ending at the right atrium and right ventricle. A left chest wall pacemaker is seen. Diffuse coronary artery calcifications are appreciated. There is minimal calcification along the aortic arch and proximal great vessels. Mediastinum/Nodes: A 1.4 cm subcarinal node is suggested. No additional mediastinal lymphadenopathy is seen. No pericardial effusion is identified. The patient's tracheostomy tube is grossly unremarkable in appearance. The visualized portions of the thyroid gland are unremarkable. No axillary lymphadenopathy is appreciated. Lungs/Pleura: There is complete consolidation of the right lower lobe, and dense airspace opacification involving portions of the right upper and middle lobes. Minimal opacity is also noted at the left lower lobe and left lingula. Findings are compatible with bilateral pneumonia. A trace right-sided pleural effusion is noted. No pneumothorax is seen. No dominant mass is identified. Given partial opacification of the bronchioles to the right lower lobe, would correlate clinically for evidence of aspiration. Upper Abdomen: The visualized portions of the liver and spleen are grossly unremarkable. The visualized portions of the adrenal glands and kidneys are within normal limits. Musculoskeletal: No acute osseous abnormalities are identified. The visualized musculature is unremarkable in appearance. Review of the MIP images confirms the above findings. IMPRESSION: 1. No evidence of pulmonary embolus. 2. Complete consolidation of the right lower lung lobe, and dense airspace opacification involving portions of the right upper and middle lobes. Minimal airspace opacity at the left lower lobe  and left lingula. Findings compatible with bilateral pneumonia. Trace right-sided pleural effusion noted. Given partial opacification of the bronchioles to the right lower lobe, underlying aspiration is suspected. Would correlate clinically. 3. Diffuse coronary artery calcifications noted. 4. 1.4 cm subcarinal node may reflect the underlying pneumonia. Electronically Signed   By: Garald Balding M.D.   On: 06/09/2016 21:15   US Abdomen Complete  Result Date: 06/10/2016 CLINICAL DATA:  Abnormal transaminases.  Anasarca EXAM: ABDOMEN ULTRASOUND COMPLETE COMPARISON:  11/12/2015 FINDINGS: Gallbladder: No gallstones or wall thickening visualized. No sonographic Murphy sign noted by sonographer. Common bile duct: Diameter: 4 mm. Where visualized, no filling defect. Liver: No focal lesion identified. Within normal limits in parenchymal echogenicity. Antegrade flow in the main portal vein. IVC: No abnormality visualized. Pancreas: Visualized portion unremarkable. Spleen: Size and appearance within normal limits. Right Kidney: Length: 10.7 cm. Echogenicity within normal limits. 9 mm shadowing calculus over the upper pole. No mass or hydronephrosis visualized. Left Kidney: Length: 10 cm. Echogenicity within normal limits. No mass or hydronephrosis visualized. Abdominal aorta: No aneurysm or other abnormality  visualized. The distal aorta is obscured by bowel gas. Other findings: Right basilar consolidation as seen on chest CT yesterday. IMPRESSION: 1. Normal appearance of the liver and biliary tree. 2. 9 mm right renal calculus. Electronically Signed   By: Monte Fantasia M.D.   On: 06/10/2016 13:51   Dg Chest Port 1 View  Result Date: 06/30/2016 CLINICAL DATA:  Cough.  Quadriplegic with tracheostomy tube. EXAM: PORTABLE CHEST 1 VIEW COMPARISON:  06/09/2016 FINDINGS: Stable position of the left dual chamber cardiac pacemaker. Heart size is within normal limits and stable. Patient is slightly rotated towards the right on  these images. Tracheostomy tube is present. Surgical hardware in the lower cervical spine. Persistent densities at the right chest base compatible with volume loss and consolidation. Cannot exclude right pleural fluid. There is concern for increased densities at the left lung base. Upper lungs remain clear. IMPRESSION: Bibasilar chest disease with progression at the left lung base. Findings could represent a combination of atelectasis and airspace disease. There is also concern for some right pleural fluid. Electronically Signed   By: Markus Daft M.D.   On: 06/30/2016 12:21   Dg Chest Portable 1 View  Result Date: 06/09/2016 CLINICAL DATA:  Edema, fever, short of breath EXAM: PORTABLE CHEST 1 VIEW COMPARISON:  05/28/2016 FINDINGS: Mild vascular congestion has progressed. Progression of small right effusion and right lower lobe airspace disease. Tracheostomy in good position.  Dual lead pacemaker unchanged. IMPRESSION: Progression of vascular congestion and small right effusion most likely due to fluid overload. Progression of right lower lobe atelectasis. Electronically Signed   By: Franchot Gallo M.D.   On: 06/09/2016 14:38    Microbiology: Recent Results (from the past 240 hour(s))  Urine culture     Status: Abnormal   Collection Time: 06/23/16  1:55 PM  Result Value Ref Range Status   Specimen Description URINE, CLEAN CATCH  Final   Special Requests Normal  Final   Culture >=100,000 COLONIES/mL PSEUDOMONAS AERUGINOSA (A)  Final   Report Status 06/26/2016 FINAL  Final   Organism ID, Bacteria PSEUDOMONAS AERUGINOSA (A)  Final      Susceptibility   Pseudomonas aeruginosa - MIC*    CEFTAZIDIME 2 SENSITIVE Sensitive     CIPROFLOXACIN >=4 RESISTANT Resistant     GENTAMICIN >=16 RESISTANT Resistant     IMIPENEM <=0.25 SENSITIVE Sensitive     PIP/TAZO <=4 SENSITIVE Sensitive     CEFEPIME 2 SENSITIVE Sensitive     * >=100,000 COLONIES/mL PSEUDOMONAS AERUGINOSA  Blood Culture (routine x 2)      Status: None (Preliminary result)   Collection Time: 06/30/16 12:46 AM  Result Value Ref Range Status   Specimen Description BLOOD BLOOD LEFT FOREARM  Final   Special Requests   Final    BOTTLES DRAWN AEROBIC AND ANAEROBIC Blood Culture adequate volume   Culture   Final    NO GROWTH 2 DAYS Performed at [facility], [address], [place], [place] [address]    Report Status PENDING  Incomplete  Blood Culture (routine x 2)     Status: None (Preliminary result)   Collection Time: 06/30/16 12:53 AM  Result Value Ref Range Status   Specimen Description BLOOD RIGHT WRIST  Final   Special Requests   Final    BOTTLES DRAWN AEROBIC AND ANAEROBIC Blood Culture adequate volume   Culture   Final    NO GROWTH 2 DAYS Performed at [facility], [place] [address], [place], [place]  [address]    Report Status PENDING  Incomplete  Urine culture     Status: Abnormal   Collection Time: 06/30/16  1:06 AM  Result Value Ref Range Status   Specimen Description URINE, RANDOM  Final   Special Requests NONE  Final   Culture MULTIPLE SPECIES PRESENT, SUGGEST RECOLLECTION (A)  Final   Report Status 07/01/2016 FINAL  Final  MRSA PCR Screening     Status: None   Collection Time: 06/30/16  2:00 PM  Result Value Ref Range Status   MRSA by PCR NEGATIVE NEGATIVE Final    Comment:        The GeneXpert MRSA Assay (FDA approved for NASAL specimens only), is one component of a comprehensive MRSA colonization surveillance program. It is not intended to diagnose MRSA infection nor to guide or monitor treatment for MRSA infections.   Culture, respiratory (NON-Expectorated)     Status: None   Collection Time: 06/30/16 11:30 PM  Result Value Ref Range Status   Specimen Description TRACHEAL ASPIRATE  Final   Special Requests NONE  Final   Gram Stain   Final    MODERATE WBC PRESENT,BOTH PMN AND MONONUCLEAR MODERATE GRAM VARIABLE ROD RARE GRAM POSITIVE COCCI IN PAIRS    Culture   Final     Consistent with normal respiratory flora. Performed at [facility], [place] [address] [place], [place] [address]    Report Status 07/03/2016 FINAL  Final     Labs: Basic Metabolic Panel:  Recent Labs Lab 06/27/16 0449 06/30/16 0043 07/01/16 0329 07/02/16 0307  NA 142 139 141 142  K 4.1 3.8 4.1 4.5  CL 102 92* 102 98*  CO2 34* 37* 32 34*  GLUCOSE 145* 83 156* 148*  BUN 16 24* 15 15  CREATININE <0.30* 0.39* 0.33* <0.30*  CALCIUM 9.2 9.3 9.3 9.9   Liver Function Tests:  Recent Labs Lab 06/30/16 0043  AST 43*  ALT 50  ALKPHOS 146*  BILITOT 0.4  PROT 7.1  ALBUMIN 3.0*   No results for input(s): LIPASE, AMYLASE in the last 168 hours. No results for input(s): AMMONIA in the last 168 hours. CBC:  Recent Labs Lab 06/27/16 0449 06/30/16 0043 06/30/16 0632 07/01/16 0329 07/02/16 0307  WBC 5.5 15.8*  --  10.9* 8.4  NEUTROABS  --  12.5*  --  7.7 5.1  HGB 7.4* 7.9* 7.4* 9.2* 10.6*  HCT 23.1* 24.6* 22.6* 28.6* 31.8*  MCV 97.9 96.9  --  95.7 93.5  PLT 273 344  --  327 304   Cardiac Enzymes: No results for input(s): CKTOTAL, CKMB, CKMBINDEX, TROPONINI in the last 168 hours. BNP: BNP (last 3 results)  Recent Labs  01/19/16 1535 05/28/16 1342 06/09/16 1447  BNP 27.4 36.3 150.8*    ProBNP (last 3 results) No results for input(s): PROBNP in the last 8760 hours.  CBG:  Recent Labs Lab 06/26/16 1151 06/26/16 1619 06/27/16 0818 07/03/16 0151 07/03/16 0625  GLUCAP 121* 119* 174* 101* 167*       Signed:  Oswald Hillock MD.  [facility] 07/03/2016, 11:37 AM

## 2016-07-03 NOTE — Progress Notes (Signed)
[facility] has arrived to transport patient, mother called and made aware per patients request.

## 2016-07-03 NOTE — Progress Notes (Signed)
NUTRITION NOTE  Pt seen by this RD on 6/11 and also seen during previous admission earlier in the month. Discharge order and summary currently in place. Noted weight +3 lbs from 6/11-6/13. Pt currently ordered home TF regimen of Osmolite 1.2 @ 100 mL/hr x10 hours (2300-0900), 30 mL Prostat BID, and 100 mL free water TID. Recommend that whichever service manages home TF regimen consider switching to Osmolite 1.5 to decrease free water given ongoing weight increase.    Jarome Matin, MS, RD, LDN, [facility] Inpatient Clinical Dietitian Pager # [phone] After hours/weekend pager # [phone]

## 2016-07-05 ENCOUNTER — Emergency Department (HOSPITAL_COMMUNITY)
Admission: EM | Admit: 2016-07-05 | Discharge: 2016-07-05 | Disposition: A | Discharge status: Home / Self Care | Payer: Medicare Other | Attending: Emergency Medicine | Admitting: Emergency Medicine

## 2016-07-05 ENCOUNTER — Encounter (HOSPITAL_COMMUNITY): Payer: Self-pay | Admitting: Nurse Practitioner

## 2016-07-05 DIAGNOSIS — Z978 Presence of other specified devices: Secondary | ICD-10-CM

## 2016-07-05 DIAGNOSIS — G8254 Quadriplegia, C5-C7 incomplete: Secondary | ICD-10-CM

## 2016-07-05 DIAGNOSIS — Z79899 Other long term (current) drug therapy: Secondary | ICD-10-CM | POA: Diagnosis not present

## 2016-07-05 DIAGNOSIS — Z960 Presence of urogenital implants: Secondary | ICD-10-CM | POA: Insufficient documentation

## 2016-07-05 DIAGNOSIS — R319 Hematuria, unspecified: Secondary | ICD-10-CM

## 2016-07-05 DIAGNOSIS — F17210 Nicotine dependence, cigarettes, uncomplicated: Secondary | ICD-10-CM | POA: Diagnosis not present

## 2016-07-05 DIAGNOSIS — J45909 Unspecified asthma, uncomplicated: Secondary | ICD-10-CM | POA: Insufficient documentation

## 2016-07-05 DIAGNOSIS — Z7982 Long term (current) use of aspirin: Secondary | ICD-10-CM | POA: Insufficient documentation

## 2016-07-05 LAB — BASIC METABOLIC PANEL
ANION GAP: 14 (ref 5–15)
BUN: 35 mg/dL — ABNORMAL HIGH (ref 6–20)
CALCIUM: 10.3 mg/dL (ref 8.9–10.3)
CO2: 36 mmol/L — ABNORMAL HIGH (ref 22–32)
CREATININE: 0.49 mg/dL (ref 0.44–1.00)
Chloride: 90 mmol/L — ABNORMAL LOW (ref 101–111)
Glucose, Bld: 72 mg/dL (ref 65–99)
Potassium: 4.4 mmol/L (ref 3.5–5.1)
Sodium: 140 mmol/L (ref 135–145)

## 2016-07-05 LAB — URINALYSIS, ROUTINE W REFLEX MICROSCOPIC

## 2016-07-05 LAB — CBC WITH DIFFERENTIAL/PLATELET
BASOS ABS: 0 10*3/uL (ref 0.0–0.1)
BASOS PCT: 0 %
EOS ABS: 0.9 10*3/uL — AB (ref 0.0–0.7)
Eosinophils Relative: 8 %
HEMATOCRIT: 35.6 % — AB (ref 36.0–46.0)
HEMOGLOBIN: 11.5 g/dL — AB (ref 12.0–15.0)
Lymphocytes Relative: 23 %
Lymphs Abs: 2.7 10*3/uL (ref 0.7–4.0)
MCH: 31.1 pg (ref 26.0–34.0)
MCHC: 32.3 g/dL (ref 30.0–36.0)
MCV: 96.2 fL (ref 78.0–100.0)
MONOS PCT: 8 %
Monocytes Absolute: 0.9 10*3/uL (ref 0.1–1.0)
NEUTROS ABS: 7.2 10*3/uL (ref 1.7–7.7)
NEUTROS PCT: 61 %
Platelets: 409 10*3/uL — ABNORMAL HIGH (ref 150–400)
RBC: 3.7 MIL/uL — ABNORMAL LOW (ref 3.87–5.11)
RDW: 13.8 % (ref 11.5–15.5)
WBC: 11.8 10*3/uL — ABNORMAL HIGH (ref 4.0–10.5)

## 2016-07-05 LAB — URINALYSIS, MICROSCOPIC (REFLEX)
Bacteria, UA: NONE SEEN
Squamous Epithelial / LPF: NONE SEEN
WBC UA: NONE SEEN WBC/hpf (ref 0–5)

## 2016-07-05 LAB — CULTURE, BLOOD (ROUTINE X 2)
CULTURE: NO GROWTH
Culture: NO GROWTH
SPECIAL REQUESTS: ADEQUATE
SPECIAL REQUESTS: ADEQUATE

## 2016-07-05 LAB — I-STAT CG4 LACTIC ACID, ED: LACTIC ACID, VENOUS: 0.82 mmol/L (ref 0.5–1.9)

## 2016-07-05 MED ORDER — SODIUM CHLORIDE 0.9 % IV BOLUS (SEPSIS)
1000.0000 mL | Freq: Once | INTRAVENOUS | Status: AC
  Administered 2016-07-05: 1000 mL via INTRAVENOUS

## 2016-07-05 NOTE — ED Provider Notes (Signed)
[facility] DEPT Provider Note   CSN: [id number] Arrival date & time: 07/05/16  0240     History   Chief Complaint Chief Complaint  Patient presents with  . Hematuria    HPI Angela Page is a 46 y.o. female.  Pt presents to the ED today with hematuria.  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  She has an indwelling foley and was admitted twice recently for sepsis secondary to UTI and hemorrhagic cystitis.  She was d/c on 6/14 after her most recent admission.  During the admission, she was treated with IV cefepime.  Urine culture multiple organisms.  ID thought pt was colonized and recommended no abx at d/c.  They thought the hematuria was secondary to residual trauma from her recent pseudomonal UTI and foley catheter irritation.  Pt said her hematuria was resolved at d/c.  The nurse that stays with her noticed hematuria today around 0530.  She said she feels ok.  No fever.             Past Medical History:  Diagnosis Date  . Asthma   . GERD (gastroesophageal reflux disease)   . HCAP (healthcare-associated pneumonia) 06/09/2016  . Pacemaker   . Pleurisy   . Protein calorie malnutrition ([place]) 10/31/2015  . Quadriparesis ([place])   . Quadriplegia, C5-C7 incomplete ([place]) 10/08/2013  . Stage IV pressure ulcer of sacral region [facility]) 10/31/2015    Patient Active Problem List   Diagnosis Date Noted  . Sepsis secondary to UTI ([place]) 06/30/2016  . Acute hemorrhagic cystitis 06/23/2016  . Gross hematuria   . Autonomic neuropathy 06/10/2016  . Presence of permanent cardiac pacemaker 06/10/2016  . Neurogenic bladder 06/10/2016  . Chronically on opiate therapy 06/10/2016  . Indwelling Foley catheter present 06/10/2016  . PEG (percutaneous endoscopic gastrostomy) status ([place]) 06/10/2016  . Pulmonary hypertension ([place]) 06/10/2016  . Anasarca 06/10/2016  . Hypoalbuminemia 06/10/2016  . Abnormal transaminases   . Distended abdomen   . Peripheral  edema   . Tracheostomy dependence ([place])   . Chronic obstructive pulmonary disease ([place])   . Chronic diastolic CHF (congestive heart failure) ([place]) 01/23/2016  . Tracheostomy status ([place])   . Palliative care encounter   . Goals of care, counseling/discussion   . Esophageal dysphagia   . Shortness of breath   . Acute respiratory failure with hypoxemia ([place])   . Encounter for intubation   . Malnutrition of moderate degree 11/01/2015  . Acute encephalopathy 10/31/2015  . Tobacco abuse 10/31/2015  . Asthma 10/31/2015  . Pressure ulcer 02/26/2015  . HCAP (healthcare-associated pneumonia) 02/25/2015  . Lower urinary tract infectious disease 02/25/2015  . Hypotension 02/25/2015  . Anemia 02/25/2015  . Enterococcus UTI 02/03/2015  . Sepsis due to urinary tract infection ([place]) 01/31/2015  . RLL pneumonia ([place]) 01/31/2015  . Altered mental status 10/29/2014  . Encephalopathy, toxic 10/29/2014  . Toxic metabolic encephalopathy 97/35/3299  . Urinary tract infection due to Proteus 10/24/2014  . Leukocytosis 10/23/2014  . Hypokalemia 10/23/2014  . Decubitus ulcer of sacral region, stage 4 ([place]) 10/23/2014  . Anemia, iron deficiency 10/23/2014  . Severe sepsis ([place])   . Septic shock ([place]) 10/22/2014  . Quadriplegia, C5-C7 incomplete ([place]) 10/22/2014  . Depression   . Protein-calorie malnutrition, severe ([place]) 09/05/2014  . Overdose of opiate or related narcotic 09/02/2014    Past Surgical History:  Procedure Laterality Date  . APPENDECTOMY    . BACK SURGERY  10/08/2013   fusion  of spine, cervical region  . CARDIAC SURGERY    . GASTROSTOMY  11/23/2015  . I&D EXTREMITY  10/28/2011   Procedure: IRRIGATION AND DEBRIDEMENT EXTREMITY;  Surgeon: Tennis Must, MD;  Location: [facility];  Service: Orthopedics;  Laterality: Right;  Irrigation and debridement right middle finger  . IR GENERIC HISTORICAL  11/23/2015   IR GASTROSTOMY TUBE MOD SED 11/23/2015 [facility]INTER[facility] RAD  .  TRACHEOSTOMY      OB History    No data available       Home Medications    Prior to Admission medications   Medication Sig Start Date End Date Taking? Authorizing Provider  acetaminophen (TYLENOL) 500 MG tablet Take 1,000 mg by mouth every 6 (six) hours as needed for mild pain, moderate pain, fever or headache.    Yes [provider]  albuterol (PROVENTIL) (2.5 MG/3ML) 0.083% nebulizer solution Take 2.5 mg by nebulization every 6 (six) hours as needed for wheezing or shortness of breath.   Yes [provider]  alum & mag hydroxide-simeth (MAALOX/MYLANTA) 200-200-20 MG/5ML suspension Take 30 mLs by mouth every 6 (six) hours as needed for indigestion or heartburn.   Yes [provider]  baclofen (LIORESAL) 20 MG tablet Take 20 mg by mouth 3 (three) times daily. 12/14/15  Yes [provider]  bisacodyl (DULCOLAX) 5 MG EC tablet Take 5 mg by mouth daily as needed for moderate constipation.   Yes [provider]  [employer] VITAMIN B12 1000 MCG tablet Place 1 tablet (1,000 mcg total) into feeding tube daily. 11/26/15  Yes Florencia Reasons, MD  DULoxetine (CYMBALTA) 30 MG capsule Take 30 mg by mouth 2 (two) times daily. 11/30/15  Yes [provider]  furosemide (LASIX) 20 MG tablet Take 2 tablets (40 mg total) by mouth daily. 06/13/16  Yes Lovenia Kim, MD  gabapentin (NEURONTIN) 400 MG capsule Take 400 mg by mouth 4 (four) times daily. 10/16/15  Yes [provider]  ibuprofen (ADVIL,MOTRIN) 200 MG tablet Place 2 tablets (400 mg total) into feeding tube every 6 (six) hours as needed for moderate pain. Patient taking differently: Place 400-600 mg into feeding tube every 6 (six) hours as needed for fever, headache, mild pain, moderate pain or cramping.  11/26/15  Yes Florencia Reasons, MD  lidocaine (LIDODERM) 5 % Place 1 patch onto the skin 2 (two) times daily as needed (pain).  04/04/15  Yes [provider]  midodrine (PROAMATINE) 10 MG tablet Place 1  tablet (10 mg total) into feeding tube 3 (three) times daily with meals. 11/26/15  Yes Florencia Reasons, MD  Nutritional Supplements (FEEDING SUPPLEMENT, OSMOLITE 1.2 CAL,) LIQD Place 100 mLs into feeding tube See admin instructions. Given for 10 hours at a time.  Given between 2300 - 0900 the next day.   Yes [provider]  Nutritional Supplements (PROMOD) LIQD Take 30 mLs by mouth 2 (two) times daily.   Yes [provider]  Oxycodone HCl 10 MG TABS Take 10 mg by mouth every 4 (four) hours as needed (for pain).   Yes [provider]  OXYGEN Inhale 3 L into the lungs continuous.   Yes [provider]  polyethylene glycol (MIRALAX / GLYCOLAX) packet Take 17 g by mouth daily as needed (constipation).   Yes [provider]  tiotropium (SPIRIVA HANDIHALER) 18 MCG inhalation capsule Place 1 capsule (18 mcg total) into inhaler and inhale daily. 02/20/16  Yes Erick Colace, NP  tiZANidine (ZANAFLEX) 4 MG tablet Take  4 mg by mouth 3 (three) times daily. 11/30/15  Yes [provider]  Water For Irrigation, Sterile (FREE WATER) SOLN Place 100 mLs into feeding tube every 8 (eight) hours. 11/26/15  Yes Florencia Reasons, MD  zinc oxide (BALMEX) 11.3 % CREA cream Apply 1 application topically 3 (three) times daily as needed (for skin redness/irritation).   Yes [provider]  zolpidem (AMBIEN) 5 MG tablet Take 1 tablet (5 mg total) by mouth at bedtime as needed for sleep. 06/27/16  Yes Theodis Blaze, MD  Nutritional Supplements (FEEDING SUPPLEMENT, OSMOLITE 1.5 CAL,) LIQD Place 1,000 mLs into feeding tube daily. Patient not taking: Reported on 06/23/2016 11/27/15   Florencia Reasons, MD    Family History Family History  Problem Relation Age of Onset  . Hypertension Maternal Uncle   . Kidney failure Maternal Uncle   . Colon cancer Mother   . Lung cancer Father   . Hypertension Brother   . Kidney failure Maternal Aunt   . Kidney failure Maternal Uncle     Social  History Social History  Substance Use Topics  . Smoking status: Current Every Day Smoker    Packs/day: 1.00    Years: 25.00    Types: Cigarettes  . Smokeless tobacco: Never Used  . Alcohol use Yes     Comment: 2 x weekly     Allergies   Erythromycin; Nitrofurantoin monohyd macro; and Penicillins   Review of Systems Review of Systems  Genitourinary: Positive for hematuria.  All other systems reviewed and are negative.    Physical Exam Updated Vital Signs BP 103/75   Pulse 80   Temp 98.4 F (36.9 C) (Oral)   Resp 11   LMP 05/27/2016 (Within Days) Comment: Irregular periods since October 2015.  SpO2 100%   Physical Exam  Constitutional: Vital signs are normal.  HENT:  Head: Normocephalic and atraumatic.  Right Ear: External ear normal.  Left Ear: External ear normal.  Nose: Nose normal.  Mouth/Throat: Oropharynx is clear and moist.  Eyes: Conjunctivae and EOM are normal. Pupils are equal, round, and reactive to light.  Neck:    Cardiovascular: Normal rate, regular rhythm, normal heart sounds and intact distal pulses.   Pulmonary/Chest: Effort normal and breath sounds normal.  Abdominal: Soft. Bowel sounds are normal.  Genitourinary:  Genitourinary Comments: Indwelling foley catheter with gross hematuria (thin, no clots)  Musculoskeletal:  Mild contractures hands and legs  Neurological: She is alert.  c5-c7 quad  Skin: Skin is warm.  Sacral decub  Psychiatric: She has a normal mood and affect. Her behavior is normal. Judgment and thought content normal.  Nursing note and vitals reviewed.    ED Treatments / Results  Labs (all labs ordered are listed, but only abnormal results are displayed) Labs Reviewed  BASIC METABOLIC PANEL - Abnormal; Notable for the following:       Result Value   Chloride 90 (*)    CO2 36 (*)    BUN 35 (*)    All other components within normal limits  CBC WITH DIFFERENTIAL/PLATELET - Abnormal; Notable for the following:    WBC  11.8 (*)    RBC 3.70 (*)    Hemoglobin 11.5 (*)    HCT 35.6 (*)    Platelets 409 (*)    Eosinophils Absolute 0.9 (*)    All other components within normal limits  URINALYSIS, ROUTINE W REFLEX MICROSCOPIC - Abnormal; Notable for the following:    Color, Urine RED (*)  APPearance TURBID (*)    Glucose, UA   (*)    Value: TEST NOT REPORTED DUE TO COLOR INTERFERENCE OF URINE PIGMENT   Hgb urine dipstick   (*)    Value: TEST NOT REPORTED DUE TO COLOR INTERFERENCE OF URINE PIGMENT   Bilirubin Urine   (*)    Value: TEST NOT REPORTED DUE TO COLOR INTERFERENCE OF URINE PIGMENT   Ketones, ur   (*)    Value: TEST NOT REPORTED DUE TO COLOR INTERFERENCE OF URINE PIGMENT   Protein, ur   (*)    Value: TEST NOT REPORTED DUE TO COLOR INTERFERENCE OF URINE PIGMENT   Nitrite   (*)    Value: TEST NOT REPORTED DUE TO COLOR INTERFERENCE OF URINE PIGMENT   Leukocytes, UA   (*)    Value: TEST NOT REPORTED DUE TO COLOR INTERFERENCE OF URINE PIGMENT   All other components within normal limits  URINE CULTURE  URINALYSIS, MICROSCOPIC (REFLEX)  I-STAT CG4 LACTIC ACID, ED  I-STAT CG4 LACTIC ACID, ED    EKG  EKG Interpretation None       Radiology No results found.  Procedures Procedures (including critical care time)  Medications Ordered in ED Medications  sodium chloride 0.9 % bolus 1,000 mL (1,000 mLs Intravenous New Bag/Given 07/05/16 1111)     Initial Impression / Assessment and Plan / ED Course  I have reviewed the triage vital signs and the nursing notes.  Pertinent labs & imaging results that were available during my care of the patient were reviewed by me and considered in my medical decision making (see chart for details).    Pt d/w Dr. Jonny Ruiz (urology) who recommended gentle flush of catheter.  Urology thinks pt will have intermittent hematuria due to her recent bad UTI and foley irritation.   Pt does not have a fever or any signs of sepsis.  She feels good and is stable for  d/c.  She knows to return if worse.  Pt has an appt with Dr. Diona Fanti next week.  She knows to keep that appt.  Final Clinical Impressions(s) / ED Diagnoses   Final diagnoses:  Hematuria, unspecified type  Presence of indwelling Foley catheter  Quadriplegia, C5-C7 incomplete ([place])    New Prescriptions New Prescriptions   No medications on file     Isla Pence, MD 07/05/16 1136

## 2016-07-05 NOTE — ED Notes (Signed)
Bed: WA04 Expected date:  Expected time:  Means of arrival:  Comments: EMS/paraplegia/hematuria

## 2016-07-05 NOTE — Discharge Instructions (Signed)
OK to flush catheter with 60 cc of normal saline or sterile water as needed for hematuria.

## 2016-07-05 NOTE — ED Triage Notes (Signed)
Patient presents to [facility] via [facility] for complains of hematuria onset this morning. She was recently hospitalized and discharged from ICU for same.

## 2016-07-06 LAB — URINE CULTURE

## 2016-07-07 ENCOUNTER — Other Ambulatory Visit (HOSPITAL_COMMUNITY)
Admission: RE | Admit: 2016-07-07 | Discharge: 2016-07-07 | Disposition: A | Discharge status: Home / Self Care | Payer: Medicare Other | Source: Other Acute Inpatient Hospital | Attending: Internal Medicine | Admitting: Internal Medicine

## 2016-07-07 ENCOUNTER — Other Ambulatory Visit: Payer: Self-pay | Admitting: Certified Registered Nurse Anesthetist

## 2016-07-07 DIAGNOSIS — L89154 Pressure ulcer of sacral region, stage 4: Secondary | ICD-10-CM | POA: Diagnosis present

## 2016-07-07 DIAGNOSIS — Z87440 Personal history of urinary (tract) infections: Secondary | ICD-10-CM | POA: Diagnosis not present

## 2016-07-07 DIAGNOSIS — G8250 Quadriplegia, unspecified: Secondary | ICD-10-CM | POA: Diagnosis not present

## 2016-07-07 DIAGNOSIS — I509 Heart failure, unspecified: Secondary | ICD-10-CM | POA: Diagnosis not present

## 2016-07-07 DIAGNOSIS — L89152 Pressure ulcer of sacral region, stage 2: Secondary | ICD-10-CM | POA: Diagnosis present

## 2016-07-10 LAB — AEROBIC CULTURE  (SUPERFICIAL SPECIMEN)

## 2016-07-11 ENCOUNTER — Other Ambulatory Visit: Payer: Self-pay | Admitting: Urology

## 2016-07-21 ENCOUNTER — Encounter (HOSPITAL_BASED_OUTPATIENT_CLINIC_OR_DEPARTMENT_OTHER): Payer: Medicare Other | Attending: Internal Medicine

## 2016-07-21 DIAGNOSIS — L89312 Pressure ulcer of right buttock, stage 2: Secondary | ICD-10-CM | POA: Diagnosis not present

## 2016-07-21 DIAGNOSIS — G8253 Quadriplegia, C5-C7 complete: Secondary | ICD-10-CM | POA: Diagnosis not present

## 2016-07-21 DIAGNOSIS — I509 Heart failure, unspecified: Secondary | ICD-10-CM | POA: Diagnosis not present

## 2016-07-21 DIAGNOSIS — L89152 Pressure ulcer of sacral region, stage 2: Secondary | ICD-10-CM | POA: Insufficient documentation

## 2016-07-22 ENCOUNTER — Encounter (HOSPITAL_COMMUNITY): Payer: Self-pay | Admitting: *Deleted

## 2016-07-24 ENCOUNTER — Telehealth: Payer: Self-pay | Admitting: Cardiology

## 2016-07-24 NOTE — Telephone Encounter (Signed)
Patient mother called and stated that a pre procedure form was faxed to our office for recommendations for pt implanted device during a upcoming procedure. I informed pt mother that our office could not give recommendations on patient device pt pt has never had her device checked at our office. Informed pt mother to follow up w/ implanting MD. Pt mother verbalized understanding.

## 2016-07-25 NOTE — Progress Notes (Signed)
Received Device orders Faxed back to me from [facility] unsigned as they have not seen patient for interrogation for device.  Received information from [facility] about information on type , model number , when device placed and all on chart. Also on chart are information on device check from [facility]   Consulted Dr. Kalman Shan about not having clearance from device physician from [facility] and he stated that it was OK if we had no clearance.

## 2016-07-26 NOTE — H&P (Signed)
H&P  Chief Complaint: Neurogenic bladder  History of Present Illness: Angela Page is a 46 y.o. year old female with C5/6 quadriplegia.  She has a neurogenic bladder.   I first became familiar with her during recent hospitalization for hemorrhagic cystitis.  She grew a Pseudomonas at that time.  Her hemorrhage required three-way Foley catheter with irrigation.  That has resolved.  The patient does have significant sacral decubitus issues, and has been on a long-term indwelling urethral catheter.  She presents at this time.  Her suprapubic tube placement to allow for ease of catheter change.  I discussed the procedure with the patient, as well as her caregiver.  They understand the procedure, risks involved, and desire to proceed.  She has been on preoperative oral antibiotics for several days before this procedure in advance of her surgery.  Past Medical History:  Diagnosis Date  . Asthma   . GERD (gastroesophageal reflux disease)   . HCAP (healthcare-associated pneumonia) 06/09/2016  . Pacemaker   . Pleurisy   . Protein calorie malnutrition ([place]) 10/31/2015  . Quadriparesis ([place])   . Quadriplegia, C5-C7 incomplete ([place]) 10/08/2013  . Stage IV pressure ulcer of sacral region ([place]) 10/31/2015    Past Surgical History:  Procedure Laterality Date  . APPENDECTOMY    . BACK SURGERY  10/08/2013   fusion of spine, cervical region  . CARDIAC SURGERY    . GASTROSTOMY  11/23/2015  . I&D EXTREMITY  10/28/2011   Procedure: IRRIGATION AND DEBRIDEMENT EXTREMITY;  Surgeon: Tennis Must, MD;  Location: [facility];  Service: Orthopedics;  Laterality: Right;  Irrigation and debridement right middle finger  . IR GENERIC HISTORICAL  11/23/2015   IR GASTROSTOMY TUBE MOD SED 11/23/2015 [facility]  . TRACHEOSTOMY      Home Medications:  No prescriptions prior to admission.    Allergies:  Allergies  Allergen Reactions  . Erythromycin Nausea And Vomiting  . Nitrofurantoin Monohyd  Macro Nausea And Vomiting  . Penicillins Hives    Has patient had a PCN reaction causing immediate rash, facial/tongue/throat swelling, SOB or lightheadedness with hypotension: Yes Has patient had a PCN reaction causing severe rash involving mucus membranes or skin necrosis: Yes Has patient had a PCN reaction that required hospitalization Yes- went to the DR may have been from amoxicillin Has patient had a PCN reaction occurring within the last 10 years: No-childhood allergy If all of the above answers are "NO", then may proceed with Cephalosporin     Family History  Problem Relation Age of Onset  . Hypertension Maternal Uncle   . Kidney failure Maternal Uncle   . Colon cancer Mother   . Lung cancer Father   . Hypertension Brother   . Kidney failure Maternal Aunt   . Kidney failure Maternal Uncle     Social History:  reports that she has been smoking Cigarettes.  She has a 25.00 pack-year smoking history. She has never used smokeless tobacco. She reports that she drinks alcohol. She reports that she does not use drugs.  ROS: A complete review of systems was performed.  All systems are negative except for pertinent findings as noted.  Physical Exam:  Vital signs in last 24 hours:   General:  Alert and oriented, No acute distress HEENT: Normocephalic, atraumatic Neck: No JVD or lymphadenopathy.  Tracheostomy in place Cardiovascular: Regular rate and rhythm Lungs: Decreased inspiratory/expiratory effort Abdomen: Scaphoid Back: No CVA tenderness Extremities: No edema Neurologic: Consistent with C5/C6 quadriplegia  Laboratory Data:  No results found for this or any previous visit (from the past 24 hour(s)). No results found for this or any previous visit (from the past 240 hour(s)). Creatinine: No results for input(s): CREATININE in the last 168 hours.  Radiologic Imaging: No results found.  Impression/Assessment:  C5-C6 quadriplegia with neurogenic bladder and need for  long-term catheterization  Plan:  Placement of tube cystostomy  Angela Page M 07/26/2016, 2:11 PM  Lillette Boxer. Roshni Burbano MD

## 2016-07-28 ENCOUNTER — Encounter (HOSPITAL_COMMUNITY): Payer: Self-pay | Admitting: *Deleted

## 2016-07-28 ENCOUNTER — Encounter (HOSPITAL_COMMUNITY)
Admission: RE | Disposition: A | Discharge status: Home / Self Care | Payer: Self-pay | Source: Ambulatory Visit | Attending: Urology

## 2016-07-28 ENCOUNTER — Ambulatory Visit (HOSPITAL_COMMUNITY): Payer: Medicare Other | Admitting: Registered Nurse

## 2016-07-28 ENCOUNTER — Ambulatory Visit (HOSPITAL_COMMUNITY): Payer: Medicare Other

## 2016-07-28 ENCOUNTER — Ambulatory Visit (HOSPITAL_COMMUNITY)
Admission: RE | Admit: 2016-07-28 | Discharge: 2016-07-28 | Disposition: A | Discharge status: Home / Self Care | Payer: Medicare Other | Source: Ambulatory Visit | Attending: Urology | Admitting: Urology

## 2016-07-28 DIAGNOSIS — G8254 Quadriplegia, C5-C7 incomplete: Secondary | ICD-10-CM | POA: Insufficient documentation

## 2016-07-28 DIAGNOSIS — Z930 Tracheostomy status: Secondary | ICD-10-CM | POA: Insufficient documentation

## 2016-07-28 DIAGNOSIS — Z79899 Other long term (current) drug therapy: Secondary | ICD-10-CM | POA: Insufficient documentation

## 2016-07-28 DIAGNOSIS — Z931 Gastrostomy status: Secondary | ICD-10-CM | POA: Insufficient documentation

## 2016-07-28 DIAGNOSIS — J449 Chronic obstructive pulmonary disease, unspecified: Secondary | ICD-10-CM | POA: Insufficient documentation

## 2016-07-28 DIAGNOSIS — N319 Neuromuscular dysfunction of bladder, unspecified: Secondary | ICD-10-CM | POA: Insufficient documentation

## 2016-07-28 DIAGNOSIS — X58XXXS Exposure to other specified factors, sequela: Secondary | ICD-10-CM | POA: Diagnosis not present

## 2016-07-28 DIAGNOSIS — Z79891 Long term (current) use of opiate analgesic: Secondary | ICD-10-CM | POA: Insufficient documentation

## 2016-07-28 DIAGNOSIS — S14105S Unspecified injury at C5 level of cervical spinal cord, sequela: Secondary | ICD-10-CM | POA: Diagnosis not present

## 2016-07-28 DIAGNOSIS — Z01811 Encounter for preprocedural respiratory examination: Secondary | ICD-10-CM

## 2016-07-28 DIAGNOSIS — Z981 Arthrodesis status: Secondary | ICD-10-CM | POA: Insufficient documentation

## 2016-07-28 DIAGNOSIS — Z950 Presence of cardiac pacemaker: Secondary | ICD-10-CM | POA: Insufficient documentation

## 2016-07-28 DIAGNOSIS — F17210 Nicotine dependence, cigarettes, uncomplicated: Secondary | ICD-10-CM | POA: Insufficient documentation

## 2016-07-28 HISTORY — PX: INSERTION OF SUPRAPUBIC CATHETER: SHX5870

## 2016-07-28 LAB — BASIC METABOLIC PANEL
ANION GAP: 14 (ref 5–15)
BUN: 19 mg/dL (ref 6–20)
CHLORIDE: 89 mmol/L — AB (ref 101–111)
CO2: 33 mmol/L — ABNORMAL HIGH (ref 22–32)
Calcium: 10.2 mg/dL (ref 8.9–10.3)
Creatinine, Ser: 0.58 mg/dL (ref 0.44–1.00)
GFR calc non Af Amer: >60 mL/min (ref >60)
Glucose, Bld: 125 mg/dL — ABNORMAL HIGH (ref 65–99)
POTASSIUM: 4.3 mmol/L (ref 3.5–5.1)
SODIUM: 136 mmol/L (ref 135–145)

## 2016-07-28 LAB — CBC
HCT: 37.3 % (ref 36.0–46.0)
HEMOGLOBIN: 12.5 g/dL (ref 12.0–15.0)
MCH: 31.2 pg (ref 26.0–34.0)
MCHC: 33.5 g/dL (ref 30.0–36.0)
MCV: 93 fL (ref 78.0–100.0)
Platelets: 282 10*3/uL (ref 150–400)
RBC: 4.01 MIL/uL (ref 3.87–5.11)
RDW: 13.9 % (ref 11.5–15.5)
WBC: 16.9 10*3/uL — AB (ref 4.0–10.5)

## 2016-07-28 SURGERY — INSERTION, SUPRAPUBIC CATHETER
Anesthesia: General

## 2016-07-28 MED ORDER — LIDOCAINE 2% (20 MG/ML) 5 ML SYRINGE
INTRAMUSCULAR | Status: AC
  Filled 2016-07-28: qty 5

## 2016-07-28 MED ORDER — PROPOFOL 10 MG/ML IV BOLUS
INTRAVENOUS | Status: DC | PRN
  Administered 2016-07-28 (×2): 50 mg via INTRAVENOUS
  Administered 2016-07-28: 30 mg via INTRAVENOUS
  Administered 2016-07-28: 50 mg via INTRAVENOUS

## 2016-07-28 MED ORDER — LACTATED RINGERS IV SOLN
INTRAVENOUS | Status: DC
  Administered 2016-07-28 (×2): via INTRAVENOUS

## 2016-07-28 MED ORDER — DEXAMETHASONE SODIUM PHOSPHATE 10 MG/ML IJ SOLN
INTRAMUSCULAR | Status: AC
  Filled 2016-07-28: qty 1

## 2016-07-28 MED ORDER — ZOLPIDEM TARTRATE 5 MG PO TABS: 10.0000 mg | ORAL_TABLET | Freq: Every evening | ORAL | 0 refills | Status: DC | PRN

## 2016-07-28 MED ORDER — SODIUM CHLORIDE 0.9 % IR SOLN
Status: DC | PRN
  Administered 2016-07-28: 4000 mL via INTRAVESICAL

## 2016-07-28 MED ORDER — ONDANSETRON HCL 4 MG/2ML IJ SOLN
INTRAMUSCULAR | Status: DC | PRN
  Administered 2016-07-28: 4 mg via INTRAVENOUS

## 2016-07-28 MED ORDER — CIPROFLOXACIN IN D5W 400 MG/200ML IV SOLN
400.0000 mg | INTRAVENOUS | Status: AC
  Administered 2016-07-28: 400 mg via INTRAVENOUS
  Filled 2016-07-28: qty 200

## 2016-07-28 MED ORDER — CIPROFLOXACIN HCL 250 MG PO TABS: 250.0000 mg | ORAL_TABLET | Freq: Two times a day (BID) | ORAL | 0 refills | Status: DC

## 2016-07-28 MED ORDER — FENTANYL CITRATE (PF) 100 MCG/2ML IJ SOLN: 25.0000 ug | INTRAMUSCULAR | Status: DC | PRN

## 2016-07-28 MED ORDER — FENTANYL CITRATE (PF) 250 MCG/5ML IJ SOLN
INTRAMUSCULAR | Status: AC
  Filled 2016-07-28: qty 5

## 2016-07-28 MED ORDER — STERILE WATER FOR IRRIGATION IR SOLN
Status: DC | PRN
  Administered 2016-07-28: 1000 mL

## 2016-07-28 MED ORDER — MEPERIDINE HCL 50 MG/ML IJ SOLN: 6.2500 mg | INTRAMUSCULAR | Status: DC | PRN

## 2016-07-28 MED ORDER — PROPOFOL 10 MG/ML IV BOLUS
INTRAVENOUS | Status: AC
  Filled 2016-07-28: qty 20

## 2016-07-28 MED ORDER — BUPIVACAINE HCL (PF) 0.5 % IJ SOLN
INTRAMUSCULAR | Status: AC
  Filled 2016-07-28: qty 30

## 2016-07-28 MED ORDER — LACTATED RINGERS IV SOLN: INTRAVENOUS | Status: DC

## 2016-07-28 MED ORDER — ONDANSETRON HCL 4 MG/2ML IJ SOLN
INTRAMUSCULAR | Status: AC
  Filled 2016-07-28: qty 2

## 2016-07-28 MED ORDER — BACITRACIN-NEOMYCIN-POLYMYXIN 400-5-5000 EX OINT
TOPICAL_OINTMENT | CUTANEOUS | Status: AC
  Filled 2016-07-28: qty 1

## 2016-07-28 MED ORDER — FENTANYL CITRATE (PF) 100 MCG/2ML IJ SOLN
INTRAMUSCULAR | Status: DC | PRN
  Administered 2016-07-28: 50 ug via INTRAVENOUS
  Administered 2016-07-28: 25 ug via INTRAVENOUS

## 2016-07-28 MED ORDER — MIDAZOLAM HCL 2 MG/2ML IJ SOLN
INTRAMUSCULAR | Status: AC
  Filled 2016-07-28: qty 2

## 2016-07-28 MED ORDER — LIDOCAINE HCL (CARDIAC) 10 MG/ML IV SOLN
INTRAVENOUS | Status: DC | PRN
Start: 2016-07-28 — End: 2016-07-28
  Administered 2016-07-28: 50 mg via INTRAVENOUS

## 2016-07-28 MED ORDER — METOCLOPRAMIDE HCL 5 MG/ML IJ SOLN: 10.0000 mg | Freq: Once | INTRAMUSCULAR | Status: DC | PRN

## 2016-07-28 SURGICAL SUPPLY — 17 items
BAG URINE DRAINAGE (UROLOGICAL SUPPLIES) ×3 IMPLANT
BAG URINE LEG 500ML (DRAIN) ×3 IMPLANT
BLADE SURG 15 STRL LF DISP TIS (BLADE) ×1 IMPLANT
BLADE SURG 15 STRL SS (BLADE) ×2
CATH BONANNO SUPRAPUBIC 14G (CATHETERS) ×3 IMPLANT
ELECT PENCIL ROCKER SW 15FT (MISCELLANEOUS) IMPLANT
ELECT REM PT RETURN 15FT ADLT (MISCELLANEOUS) ×3 IMPLANT
GLOVE BIOGEL M 8.0 STRL (GLOVE) ×3 IMPLANT
GOWN STRL REUS W/TWL XL LVL3 (GOWN DISPOSABLE) ×6 IMPLANT
MANIFOLD NEPTUNE II (INSTRUMENTS) ×3 IMPLANT
NEEDLE HYPO 22GX1.5 SAFETY (NEEDLE) IMPLANT
NS IRRIG 1000ML POUR BTL (IV SOLUTION) ×3 IMPLANT
PACK CYSTO (CUSTOM PROCEDURE TRAY) ×3 IMPLANT
PLUG CATH AND CAP STER (CATHETERS) IMPLANT
SUT ETHILON 3 0 FSL (SUTURE) ×3 IMPLANT
TOWEL OR 17X26 10 PK STRL BLUE (TOWEL DISPOSABLE) ×3 IMPLANT
WATER STERILE IRR 3000ML UROMA (IV SOLUTION) ×3 IMPLANT

## 2016-07-28 NOTE — Transfer of Care (Signed)
Immediate Anesthesia Transfer of Care Note  Patient: Angela Page  Procedure(s) Performed: Procedure(s): INSERTION OF SUPRAPUBIC CATHETER (N/A)  Patient Location: PACU  Anesthesia Type:General  Level of Consciousness: awake, alert , oriented and patient cooperative  Airway & Oxygen Therapy: Patient Spontanous Breathing and Patient connected to nasal cannula oxygen  Post-op Assessment: Report given to RN, Post -op Vital signs reviewed and stable and Patient moving all extremities X 4  Post vital signs: stable  Last Vitals:  Vitals:   07/28/16 0811 07/28/16 1208  BP: 131/86 (!) 106/51  Pulse: 81 80  Resp: 16 (!) 24  Temp: 36.9 C 36.8 C    Last Pain:  Vitals:   07/28/16 1208  TempSrc:   PainSc: 0-No pain      Patients Stated Pain Goal: 3 (47/42/59 [id number])  Complications: No apparent anesthesia complications

## 2016-07-28 NOTE — Anesthesia Procedure Notes (Signed)
Procedure Name: Intubation Date/Time: 07/28/2016 11:16 AM Performed by: Lissa Morales Pre-anesthesia Checklist: Patient identified, Emergency Drugs available, Suction available and Patient being monitored Patient Re-evaluated:Patient Re-evaluated prior to inductionOxygen Delivery Method: Circle system utilized Preoxygenation: Pre-oxygenation with 100% oxygen Intubation Type: IV induction Tube type: Reinforced (inserted in trach stoma by Dr. Marcell Barlow, lidocaine down ETT) Tube size: 6.0 (cuff inflated below  stoma) mm Number of attempts: 1 Placement Confirmation: positive ETCO2 and breath sounds checked- equal and bilateral Tube secured with: Tape Dental Injury: Teeth and Oropharynx as per pre-operative assessment

## 2016-07-28 NOTE — Anesthesia Postprocedure Evaluation (Signed)
Anesthesia Post Note  Patient: Angela Page  Procedure(s) Performed: Procedure(s) (LRB): INSERTION OF SUPRAPUBIC CATHETER (N/A)     Patient location during evaluation: PACU Anesthesia Type: General Level of consciousness: awake and alert Pain management: pain level controlled Vital Signs Assessment: post-procedure vital signs reviewed and stable Respiratory status: spontaneous breathing, nonlabored ventilation, respiratory function stable and patient connected to nasal cannula oxygen Cardiovascular status: blood pressure returned to baseline and stable Postop Assessment: no signs of nausea or vomiting Anesthetic complications: no    Last Vitals:  Vitals:   07/28/16 1253 07/28/16 1345  BP: (!) 90/48 (!) 95/56  Pulse: 79 80  Resp: 14 16  Temp: 36.6 C 36.5 C    Last Pain:  Vitals:   07/28/16 1345  TempSrc: Oral  PainSc:                  [name]

## 2016-07-28 NOTE — Interval H&P Note (Signed)
History and Physical Interval Note:  07/28/2016 10:50 AM  [name]  has presented today for surgery, with the diagnosis of NEUROGENIC BLADDER  The various methods of treatment have been discussed with the patient and family. After consideration of risks, benefits and other options for treatment, the patient has consented to  Procedure(s): INSERTION OF SUPRAPUBIC CATHETER (N/A) as a surgical intervention .  The patient's history has been reviewed, patient examined, no change in status, stable for surgery.  I have reviewed the patient's chart and labs.  Questions were answered to the patient's satisfaction.     Angela Page

## 2016-07-28 NOTE — Anesthesia Preprocedure Evaluation (Addendum)
Anesthesia Evaluation  Patient identified by MRN, date of birth, ID band Patient awake    Reviewed: Allergy & Precautions, NPO status , Patient's Chart, lab work & pertinent test results  Airway Mallampati: Trach  TM Distance: >3 FB Neck ROM: Full    Dental no notable dental hx.    Pulmonary asthma , COPD, Current Smoker,  tracheostomy   Pulmonary exam normal breath sounds clear to auscultation       Cardiovascular negative cardio ROS Normal cardiovascular exam+ pacemaker  Rhythm:Regular Rate:Normal     Neuro/Psych Quadraplegia negative psych ROS   GI/Hepatic negative GI ROS, Neg liver ROS,   Endo/Other  negative endocrine ROS  Renal/GU negative Renal ROS  negative genitourinary   Musculoskeletal negative musculoskeletal ROS (+)   Abdominal   Peds negative pediatric ROS (+)  Hematology negative hematology ROS (+)   Anesthesia Other Findings   Reproductive/Obstetrics negative OB ROS                             Anesthesia Physical Anesthesia Plan  ASA: III  Anesthesia Plan: General   Post-op Pain Management:    Induction: Intravenous  PONV Risk Score and Plan:   Airway Management Planned: Tracheostomy  Additional Equipment:   Intra-op Plan:   Post-operative Plan: Extubation in OR  Informed Consent: I have reviewed the patients History and Physical, chart, labs and discussed the procedure including the risks, benefits and alternatives for the proposed anesthesia with the patient or authorized representative who has indicated his/her understanding and acceptance.   Dental advisory given  Plan Discussed with: CRNA  Anesthesia Plan Comments:        Anesthesia Quick Evaluation

## 2016-07-28 NOTE — Discharge Instructions (Signed)
1. Expect some bleeding around tube site for 2-3 days--although this should be a small amount  2. Keep tube site dressed for 3-4 days--OK to change gauze if necessary  3. Cleanse tube site with peroxide/place neosporin 1-2 times a day  4. Call for fever, nausea or vomiting   General Anesthesia, Adult, Care After These instructions provide you with information about caring for yourself after your procedure. Your health care provider may also give you more specific instructions. Your treatment has been planned according to current medical practices, but problems sometimes occur. Call your health care provider if you have any problems or questions after your procedure. What can I expect after the procedure? After the procedure, it is common to have:  Vomiting.  A sore throat.  Mental slowness.  It is common to feel:  Nauseous.  Cold or shivery.  Sleepy.  Tired.  Sore or achy, even in parts of your body where you did not have surgery.  Follow these instructions at home: For at least 24 hours after the procedure:  Do not: ? Participate in activities where you could fall or become injured. ? Drive. ? Use heavy machinery. ? Drink alcohol. ? Take sleeping pills or medicines that cause drowsiness. ? Make important decisions or sign legal documents. ? Take care of children on your own.  Rest. Eating and drinking  If you vomit, drink water, juice, or soup when you can drink without vomiting.  Drink enough fluid to keep your urine clear or pale yellow.  Make sure you have little or no nausea before eating solid foods.  Follow the diet recommended by your health care provider. General instructions  Have a responsible adult stay with you until you are awake and alert.  Return to your normal activities as told by your health care provider. Ask your health care provider what activities are safe for you.  Take over-the-counter and prescription medicines only as told by your  health care provider.  If you smoke, do not smoke without supervision.  Keep all follow-up visits as told by your health care provider. This is important. Contact a health care provider if:  You continue to have nausea or vomiting at home, and medicines are not helpful.  You cannot drink fluids or start eating again.  You cannot urinate after 8-12 hours.  You develop a skin rash.  You have fever.  You have increasing redness at the site of your procedure. Get help right away if:  You have difficulty breathing.  You have chest pain.  You have unexpected bleeding.  You feel that you are having a life-threatening or urgent problem. This information is not intended to replace advice given to you by your health care provider. Make sure you discuss any questions you have with your health care provider. Document Released: 04/14/2000 Document Revised: 06/11/2015 Document Reviewed: 12/21/2014 Elsevier Interactive Patient Education  [employer].

## 2016-07-30 ENCOUNTER — Ambulatory Visit (HOSPITAL_COMMUNITY): Payer: Medicare Other

## 2016-08-04 NOTE — Op Note (Signed)
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  She has a need for long-term indwelling Foley catheter for bladder drainage.  The patient was recently admitted for hemorrhagic cystitis.  At that time, consultation was provided by myself.  The patient and her caregiver were interested in long-term suprapubic tube placement at that time.  She presents at this time for this procedure.  The procedure itself as well as risks and complications have been discussed with the patient and her caregiver.  These include, but are not limited to infection, bleeding, bowel injury, anesthetic complications, among others.  She understands these and desires to proceed.  Description of procedure: The patient was properly identified in the holding area.  She received preoperative IV antibiotics.  She was taken to the operating room where general anesthetic was administered with the endotracheal apparatus.  She was then placed in the dorsolithotomy position.  Genitalia and perineum were prepped and draped.  A proper timeout was performed.  A 21 French panendoscope was advanced into her bladder which was inspected circumferentially.  There were no significant lesions.  Mild erythema present in the posterior bladder consistent with irritation from the indwelling Foley catheter tip.  Ureteral orifices were normal in configuration and location.  The bladder was quickly distended with saline.  I then used a Lowsley retractor to enter the bladder, and the tip was removed anteriorly against the anterior bladder wall and the suprapubic area.   This could be palpated through the patient's abdomen.  I then used the Bovie to cut down on this.  The tip of the retractor was passed out of the body, grasp a 43 French Foley catheter, and then this was brought down into the bladder.  Once positioning was verified by saline coming from the catheter, the balloon was filled with 20 cc of water.  This was hooked to dependent drainage.  I then verified catheter balloon position within the bladder cystoscopically.  The cystostomy site was then closed with 2-0 silk placed in a vertical mattress fashion.  A stay suture was also placed on the catheter itself.  At this time, the procedure was terminated.  The patient was awakened and taken to the PACU in stable condition.  She tolerated the procedure well.

## 2016-08-11 DIAGNOSIS — L89152 Pressure ulcer of sacral region, stage 2: Secondary | ICD-10-CM | POA: Diagnosis not present

## 2016-08-20 ENCOUNTER — Ambulatory Visit (HOSPITAL_COMMUNITY)
Admission: RE | Admit: 2016-08-20 | Discharge: 2016-08-20 | Disposition: A | Discharge status: Home / Self Care | Payer: Medicare Other | Source: Ambulatory Visit | Attending: Acute Care | Admitting: Acute Care

## 2016-08-20 DIAGNOSIS — R0689 Other abnormalities of breathing: Secondary | ICD-10-CM | POA: Diagnosis not present

## 2016-08-20 DIAGNOSIS — Z430 Encounter for attention to tracheostomy: Secondary | ICD-10-CM | POA: Diagnosis not present

## 2016-08-20 DIAGNOSIS — G8250 Quadriplegia, unspecified: Secondary | ICD-10-CM | POA: Insufficient documentation

## 2016-08-20 DIAGNOSIS — Z720 Tobacco use: Secondary | ICD-10-CM

## 2016-08-20 DIAGNOSIS — Z930 Tracheostomy status: Secondary | ICD-10-CM | POA: Diagnosis not present

## 2016-08-20 DIAGNOSIS — J9610 Chronic respiratory failure, unspecified whether with hypoxia or hypercapnia: Secondary | ICD-10-CM | POA: Diagnosis not present

## 2016-08-20 NOTE — Progress Notes (Addendum)
    Instructions/communication   1) OK to keep red occlusive inner cannula 24/7 2) Ok to sleep w/ humidified n/c at HS when red occlusive trach inner cannula is in place.  3) OK to use open inner cannula and humidified trach collar PRN for suction  4) Ok for nursing staff to change trach q 3 months (# 6 cuffless shiley). Plan for alternating trach changes. Nurse change in November; trach clinic change feb and alternate every 3 months there on out.   Erick Colace ACNP-BC [place] Pager # [phone] OR # [phone] if no answer

## 2016-08-20 NOTE — Progress Notes (Signed)
   Subjective:  [name]    Patient ID: Angela Page, female    DOB: [birth year], 46 y.o.   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 We have decided to keep her trach indefinitely due to her poor cough mechanics and risk or recurrent respiratory failure.   Review of Systems  Constitutional: Negative.   HENT: Negative.   Eyes: Negative.   Respiratory: Negative.   Cardiovascular: Negative.   Gastrointestinal: Negative.   Endocrine: Negative.   Allergic/Immunologic: Negative.   Neurological: Negative.   Hematological: Negative.   Psychiatric/Behavioral: Negative.       HR 80 bp 94/56 rr 18 sats 97%  Objective:   Physical Exam  Constitutional: She is oriented to person, place, and time. She appears well-nourished. No distress.  HENT:  Head: Normocephalic and atraumatic.  # 6 trach (cuffless Shiley) Stoma unremarkable   Eyes: Pupils are equal, round, and reactive to light. Conjunctivae are normal.  Neck: Normal range of motion. Neck supple.  Cardiovascular: Normal rate and regular rhythm.   No murmur heard. Pulmonary/Chest: Effort normal and breath sounds normal.  Abdominal: Soft. Bowel sounds are normal.  Musculoskeletal: Normal range of motion. She exhibits no edema.  Neurological: She is alert and oriented to person, place, and time.  Skin: Skin is warm and dry. She is not diaphoretic. No erythema.  Psychiatric: She has a normal mood and affect.    Trach BrandCristy Hilts Size: 6.0 Style: Uncuffed Secured by: Velcro  Assessment & Plan:   Tracheostomy dependence Chronic respiratory failure  Quadriplegia  Poor cough mechanics Mucous plugging Tobacco abuse   Discussion [name] is doing well from a trach stand-point. I continue to think she is NOT a candidate for decannulation. She does not want to use the humidified trach collar. I think that it is reasonable to keep her capped  24/7 and just suction PRN. I again talked to her about the danger of on-going smoking.   Plan -Will have trach changed here at clinic every other 3 months with the nursing staff change trach in-between -keep trach capped 24/7 and change ATC to PRN  My cct 35 minutes  Erick Colace ACNP-BC [place] Pager # [phone] OR # [phone] if no answer

## 2016-08-20 NOTE — Progress Notes (Signed)
Tracheostomy Procedure Note  Angela Page [id number] Oct 06, 1970  Pre Procedure Tracheostomy Information  Trach Brand: Shiley Size: 6.0 Style: Uncuffed Secured by: Velcro   Procedure: trach changed    Post Procedure Tracheostomy Information  Trach Brand: Shiley Size: 6.0 Style: uncuffed Secured by: Velcro   Post Procedure Evaluation:  ETCO2 positive color change from yellow to purple : Yes.   Vital signs:blood pressure 94/56, pulse 80, respirations 18 and pulse oximetry 97 % Patients current condition: stable Complications: No apparent complications Trach site exam: clean, dry Wound care done: 4 x 4 gauze Patient did tolerate procedure well.   Education: none  Prescription needs: none    Additional needs: none

## 2016-08-25 ENCOUNTER — Encounter (HOSPITAL_BASED_OUTPATIENT_CLINIC_OR_DEPARTMENT_OTHER): Payer: Medicare Other | Attending: Internal Medicine

## 2016-08-25 DIAGNOSIS — L89152 Pressure ulcer of sacral region, stage 2: Secondary | ICD-10-CM | POA: Insufficient documentation

## 2016-08-25 DIAGNOSIS — G8253 Quadriplegia, C5-C7 complete: Secondary | ICD-10-CM | POA: Insufficient documentation

## 2016-09-01 ENCOUNTER — Telehealth: Payer: Self-pay | Admitting: Acute Care

## 2016-09-01 DIAGNOSIS — L89152 Pressure ulcer of sacral region, stage 2: Secondary | ICD-10-CM | POA: Diagnosis not present

## 2016-09-01 DIAGNOSIS — G8253 Quadriplegia, C5-C7 complete: Secondary | ICD-10-CM | POA: Diagnosis not present

## 2016-09-04 IMAGING — CR DG CHEST 1V PORT
1 series · 1 of 1 positions shown · non-contrast
Comparison: None.

CLINICAL DATA: 43-year-old female with hypotension

EXAM:
PORTABLE CHEST - 1 VIEW

[AP]
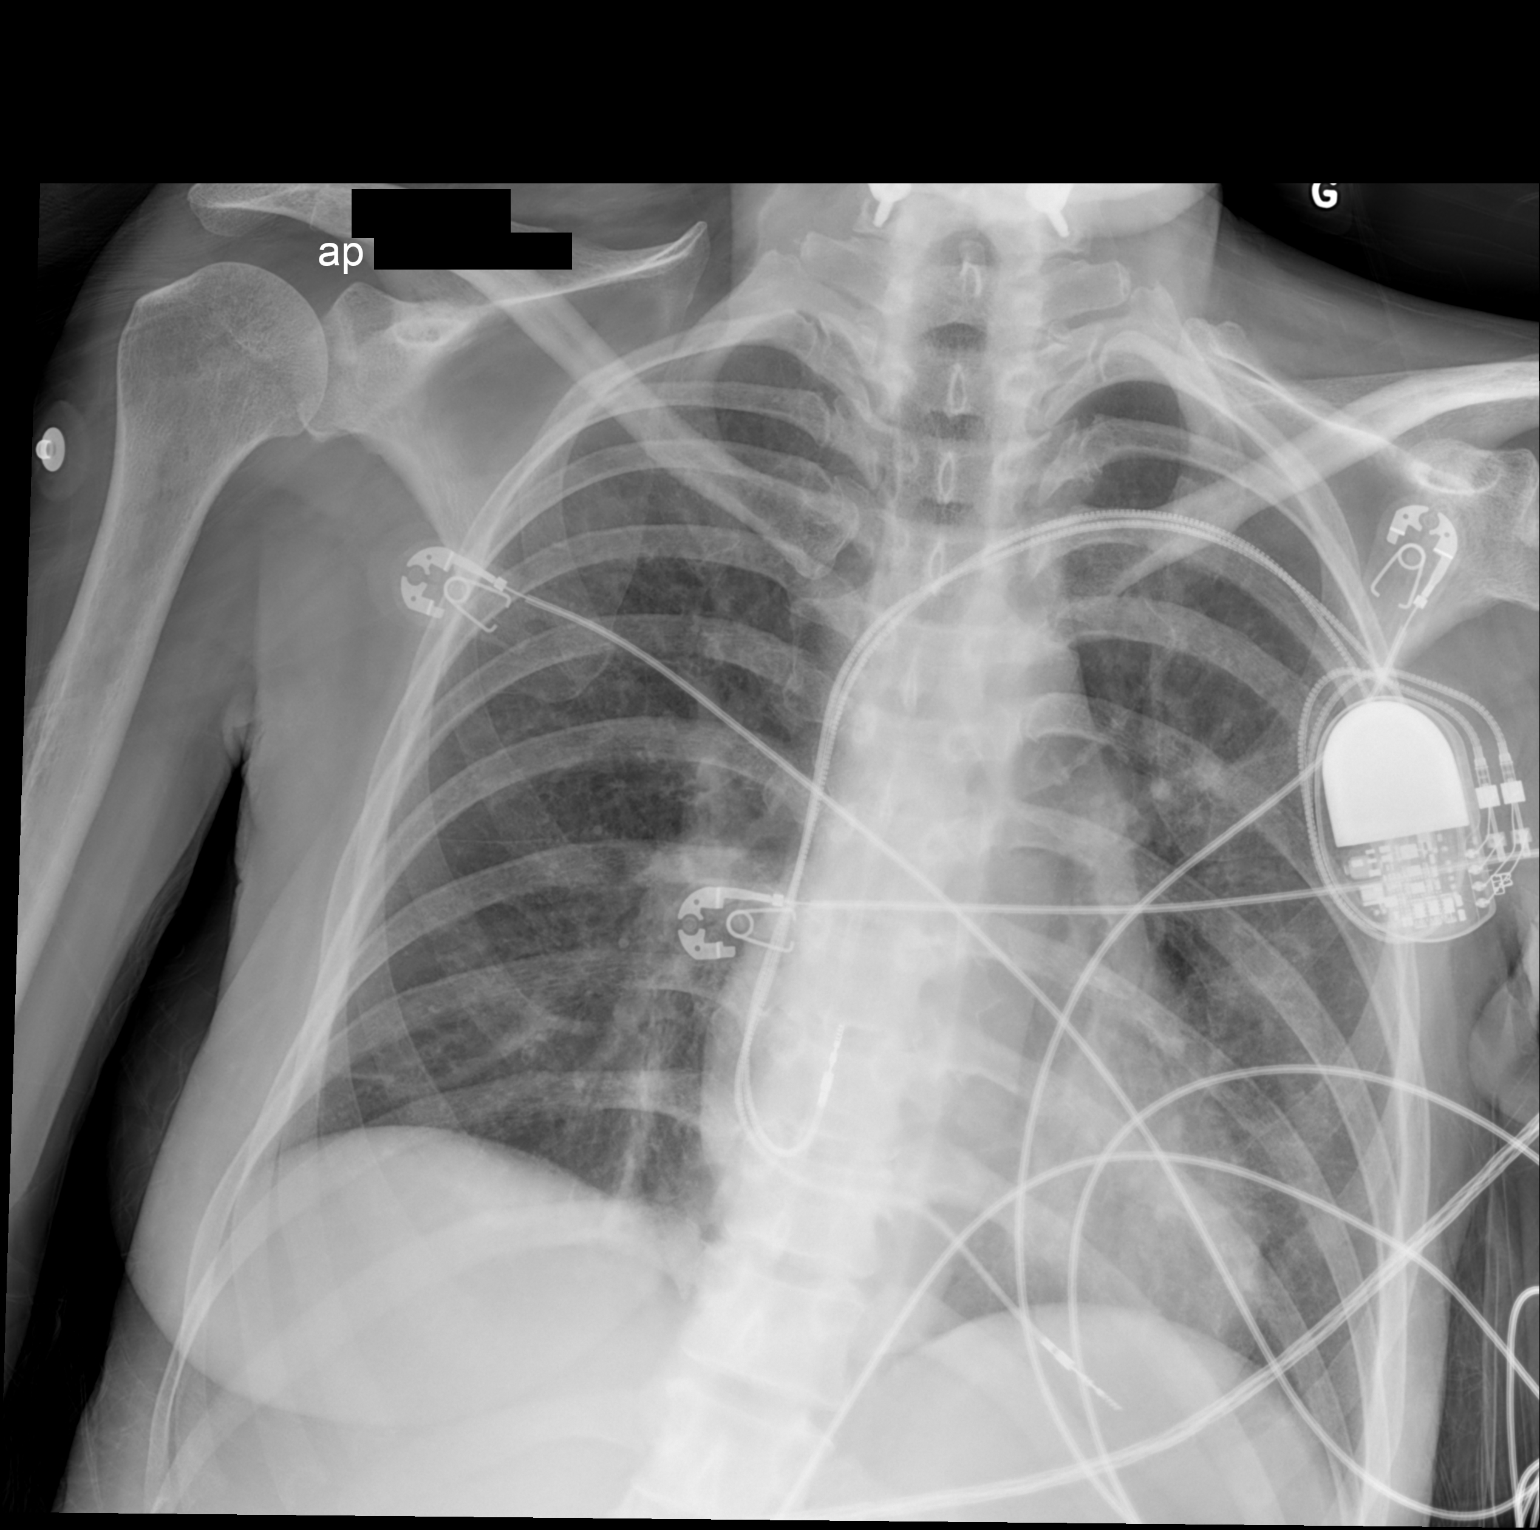

[1 of 1 positions shown; findings below may reference images not displayed]

FINDINGS: Single-view of the chest demonstrate clear lungs. No pleural
effusion or pneumothorax. Top-normal cardiac silhouette. Left
pectoral pacemaker device. No acute fracture. Cervical spine
fixation the screws noted.
IMPRESSION: No active disease.

## 2016-09-10 ENCOUNTER — Telehealth: Payer: Self-pay | Admitting: Family Medicine

## 2016-09-10 NOTE — Telephone Encounter (Signed)
ROI faxed to [facility]

## 2016-09-11 ENCOUNTER — Ambulatory Visit (INDEPENDENT_AMBULATORY_CARE_PROVIDER_SITE_OTHER): Payer: Medicare Other | Admitting: Family Medicine

## 2016-09-11 ENCOUNTER — Encounter: Payer: Self-pay | Admitting: Surgical

## 2016-09-11 ENCOUNTER — Encounter: Payer: Self-pay | Admitting: Family Medicine

## 2016-09-11 VITALS — BP 124/78 | HR 92 | Temp 98.2°F

## 2016-09-11 DIAGNOSIS — F419 Anxiety disorder, unspecified: Secondary | ICD-10-CM

## 2016-09-11 DIAGNOSIS — R748 Abnormal levels of other serum enzymes: Secondary | ICD-10-CM | POA: Diagnosis not present

## 2016-09-11 DIAGNOSIS — Z23 Encounter for immunization: Secondary | ICD-10-CM | POA: Diagnosis not present

## 2016-09-11 DIAGNOSIS — E43 Unspecified severe protein-calorie malnutrition: Secondary | ICD-10-CM | POA: Diagnosis not present

## 2016-09-11 DIAGNOSIS — E559 Vitamin D deficiency, unspecified: Secondary | ICD-10-CM | POA: Diagnosis not present

## 2016-09-11 DIAGNOSIS — D72825 Bandemia: Secondary | ICD-10-CM | POA: Diagnosis not present

## 2016-09-11 DIAGNOSIS — Z720 Tobacco use: Secondary | ICD-10-CM | POA: Diagnosis not present

## 2016-09-11 DIAGNOSIS — R1310 Dysphagia, unspecified: Secondary | ICD-10-CM

## 2016-09-11 DIAGNOSIS — E8809 Other disorders of plasma-protein metabolism, not elsewhere classified: Secondary | ICD-10-CM

## 2016-09-11 DIAGNOSIS — R5383 Other fatigue: Secondary | ICD-10-CM

## 2016-09-11 DIAGNOSIS — E538 Deficiency of other specified B group vitamins: Secondary | ICD-10-CM

## 2016-09-11 DIAGNOSIS — L89154 Pressure ulcer of sacral region, stage 4: Secondary | ICD-10-CM | POA: Diagnosis not present

## 2016-09-11 DIAGNOSIS — R638 Other symptoms and signs concerning food and fluid intake: Secondary | ICD-10-CM

## 2016-09-11 DIAGNOSIS — R7989 Other specified abnormal findings of blood chemistry: Secondary | ICD-10-CM

## 2016-09-11 DIAGNOSIS — R1319 Other dysphagia: Secondary | ICD-10-CM

## 2016-09-11 LAB — COMPREHENSIVE METABOLIC PANEL
ALT: 46 U/L — ABNORMAL HIGH (ref 0–35)
AST: 29 U/L (ref 0–37)
Albumin: 4.2 g/dL (ref 3.5–5.2)
Alkaline Phosphatase: 246 U/L — ABNORMAL HIGH (ref 39–117)
BUN: 35 mg/dL — ABNORMAL HIGH (ref 6–23)
CO2: 39 mEq/L — ABNORMAL HIGH (ref 19–32)
Calcium: 10.7 mg/dL — ABNORMAL HIGH (ref 8.4–10.5)
Chloride: 89 mEq/L — ABNORMAL LOW (ref 96–112)
Creatinine, Ser: 0.48 mg/dL (ref 0.40–1.20)
GFR: 147.99 mL/min (ref >60.00)
Glucose, Bld: 59 mg/dL — ABNORMAL LOW (ref 70–99)
Potassium: 3.8 mEq/L (ref 3.5–5.1)
Sodium: 136 mEq/L (ref 135–145)
Total Bilirubin: 0.2 mg/dL (ref 0.2–1.2)
Total Protein: 8.4 g/dL — ABNORMAL HIGH (ref 6.0–8.3)

## 2016-09-11 LAB — CBC WITH DIFFERENTIAL/PLATELET
Basophils Absolute: 0.1 10*3/uL (ref 0.0–0.1)
Basophils Relative: 0.4 % (ref 0.0–3.0)
Eosinophils Absolute: 0.5 10*3/uL (ref 0.0–0.7)
Eosinophils Relative: 3.3 % (ref 0.0–5.0)
HCT: 32.5 % — ABNORMAL LOW (ref 36.0–46.0)
Hemoglobin: 10.7 g/dL — ABNORMAL LOW (ref 12.0–15.0)
Lymphocytes Relative: 16.7 % (ref 12.0–46.0)
Lymphs Abs: 2.7 10*3/uL (ref 0.7–4.0)
MCHC: 32.9 g/dL (ref 30.0–36.0)
MCV: 92.6 fl (ref 78.0–100.0)
Monocytes Absolute: 0.8 10*3/uL (ref 0.1–1.0)
Monocytes Relative: 5.1 % (ref 3.0–12.0)
Neutro Abs: 11.9 10*3/uL — ABNORMAL HIGH (ref 1.4–7.7)
Neutrophils Relative %: 74.5 % (ref 43.0–77.0)
Platelets: 525 10*3/uL — ABNORMAL HIGH (ref 150.0–400.0)
RBC: 3.51 Mil/uL — ABNORMAL LOW (ref 3.87–5.11)
RDW: 14.7 % (ref 11.5–15.5)
WBC: 16 10*3/uL — ABNORMAL HIGH (ref 4.0–10.5)

## 2016-09-11 LAB — VITAMIN D 25 HYDROXY (VIT D DEFICIENCY, FRACTURES): VITD: 23.02 ng/mL — ABNORMAL LOW (ref 30.00–100.00)

## 2016-09-11 LAB — TSH: TSH: 0.77 u[IU]/mL (ref 0.35–4.50)

## 2016-09-11 LAB — VITAMIN B12: Vitamin B-12: >1500 pg/mL — ABNORMAL HIGH (ref 211–911)

## 2016-09-11 NOTE — Progress Notes (Signed)
Angela Page is a 46 y.o. female is here to [facility].   Patient Care Team: Briscoe Deutscher, DO as PCP - General (Family Medicine)   History of Present Illness:   Angela Page CMA acting as scribe for Dr. [name].  HPI: Patient comes in today to establish care. She was being seen in [facility]. Patient sees pain management, they prescribe Oxycodone and Ambien. We will schedule an AWV with Cassie.  Wound: Patient has stage 4 sacral region ulcer. Home health nurse stated that the wound has gotten much better since she first started with patient. Patient is on public transportation and has high anxiety with transportation due to the wounds opening back up.   G-tube: Nurse stated that they would like to know if G-Tube is sawn in or if it is changeable. Will check to see if there is any information in the charts. May have to refer to general surgery.   Pain: Patient has started feeling deep pain in body parts. She will feel pressure in the abdomin area. She states that she has pain in the shoulders. She has had Botox injections in the past to try to help straighten and open up her hand.   Anxiety: Patient stated that she has handle her injury very well and outlook is ok. She is generally a pretty positive person, but here lately she just feels more anxiety. When she has to go out she starts to panic. If she gets very tired then she starts feeling angry. Sometimes feels like she just "can't lay there any longer".  She has put on over 30 pounds since November 2017. Patient has been on Klonopin before and patient stated that she thought it worked well.    Health Maintenance Due  Topic Date Due  . PAP SMEAR  09/11/1991   No flowsheet data found. PMHx, SurgHx, SocialHx, Medications, and Allergies were reviewed in the Visit Navigator and updated as appropriate.   Past Medical History:  Diagnosis Date  . Anasarca 06/10/2016  . Asthma   . Cocaine use 10/08/2013  . GERD  (gastroesophageal reflux disease)   . HCAP (healthcare-associated pneumonia) 06/09/2016  . Overdose of opiate or related narcotic 09/02/2014  . Pacemaker   . Pleurisy   . Polysubstance dependence including opioid type drug, episodic abuse ([place]) 10/27/2013  . Protein calorie malnutrition ([place]) 10/31/2015  . Quadriparesis ([place])   . Quadriplegia, C5-C7 incomplete ([place]) 10/08/2013  . Stage IV pressure ulcer of sacral region ([place]) 10/31/2015    Past Surgical History:  Procedure Laterality Date  . APPENDECTOMY    . BACK SURGERY  10/08/2013   fusion of spine, cervical region  . CARDIAC SURGERY    . GASTROSTOMY  11/23/2015  . I&D EXTREMITY  10/28/2011   Procedure: IRRIGATION AND DEBRIDEMENT EXTREMITY;  Surgeon: Tennis Must, MD;  Location: [place];  Service: Orthopedics;  Laterality: Right;  Irrigation and debridement right middle finger  . INSERTION OF SUPRAPUBIC CATHETER N/A 07/28/2016   Procedure: INSERTION OF SUPRAPUBIC CATHETER;  Surgeon: Franchot Gallo, MD;  Location: [facility];  Service: Urology;  Laterality: N/A;  . IR GENERIC HISTORICAL  11/23/2015   IR GASTROSTOMY TUBE MOD SED 11/23/2015 [facility]-INTERV RAD  . TRACHEOSTOMY      Family History  Problem Relation Age of Onset  . Hypertension Maternal Uncle   . Kidney failure Maternal Uncle   . Colon cancer Mother   . Lung cancer Father   . Hypertension Brother   .  Kidney failure Maternal Aunt   . Kidney failure Maternal Uncle    Social History  Substance Use Topics  . Smoking status: Current Every Day Smoker    Packs/day: 1.00    Years: 25.00    Types: Cigarettes  . Smokeless tobacco: Never Used  . Alcohol use Yes     Comment: 2 x weekly    Current Medications and Allergies:   Current Outpatient Prescriptions:  .  acetaminophen (TYLENOL) 500 MG tablet, Take 1,000 mg by mouth every 6 (six) hours as needed for mild pain, moderate pain, fever or headache. , Disp: , Rfl:  .  albuterol (PROVENTIL HFA;VENTOLIN  HFA) 108 (90 Base) MCG/ACT inhaler, Inhale 1-2 puffs into the lungs every 6 (six) hours as needed for wheezing or shortness of breath., Disp: , Rfl:  .  albuterol (PROVENTIL) (2.5 MG/3ML) 0.083% nebulizer solution, Take 2.5 mg by nebulization every 6 (six) hours as needed for wheezing or shortness of breath., Disp: , Rfl:  .  alum & mag hydroxide-simeth (MAALOX/MYLANTA) 200-200-20 MG/5ML suspension, Take 30 mLs by mouth every 6 (six) hours as needed for indigestion or heartburn., Disp: , Rfl:  .  baclofen (LIORESAL) 20 MG tablet, Take 20 mg by mouth 3 (three) times daily., Disp: , Rfl: 3 .  bisacodyl (DULCOLAX) 5 MG EC tablet, Take 5 mg by mouth daily as needed for moderate constipation., Disp: , Rfl:  .  ciprofloxacin (CIPRO) 250 MG tablet, Take 1 tablet (250 mg total) by mouth 2 (two) times daily., Disp: 6 tablet, Rfl: 0 .  [employer] VITAMIN B12 1000 MCG tablet, Place 1 tablet (1,000 mcg total) into feeding tube daily., Disp: 30 tablet, Rfl: 0 .  DULoxetine (CYMBALTA) 30 MG capsule, Take 30 mg by mouth 2 (two) times daily., Disp: , Rfl:  .  furosemide (LASIX) 20 MG tablet, Take 2 tablets (40 mg total) by mouth daily., Disp: 30 tablet, Rfl: 2 .  gabapentin (NEURONTIN) 400 MG capsule, Take 400 mg by mouth 4 (four) times daily., Disp: , Rfl: 3 .  ibuprofen (ADVIL,MOTRIN) 200 MG tablet, Place 2 tablets (400 mg total) into feeding tube every 6 (six) hours as needed for moderate pain. (Patient taking differently: Place 400-600 mg into feeding tube every 6 (six) hours as needed for fever, headache, mild pain, moderate pain or cramping. ), Disp: 30 tablet, Rfl: 0 .  lidocaine (LIDODERM) 5 %, Place 1 patch onto the skin 2 (two) times daily as needed (pain). , Disp: , Rfl: 0 .  midodrine (PROAMATINE) 10 MG tablet, Place 1 tablet (10 mg total) into feeding tube 3 (three) times daily with meals., Disp: 90 tablet, Rfl: 0 .  Nutritional Supplements (FEEDING SUPPLEMENT, OSMOLITE 1.2 CAL,) LIQD, Place 100 mLs into feeding  tube See admin instructions. Given for 10 hours at a time.  Given between 2300 - 0900 the next day., Disp: , Rfl:  .  Nutritional Supplements (PROMOD) LIQD, Take 30 mLs by mouth 2 (two) times daily., Disp: , Rfl:  .  oxybutynin (DITROPAN) 5 MG tablet, Take 5 mg by mouth every 8 (eight) hours as needed for bladder spasms., Disp: , Rfl: 11 .  Oxycodone HCl 10 MG TABS, Take 10 mg by mouth 4 (four) times daily as needed (for pain). , Disp: , Rfl:  .  OXYGEN, Inhale 3 L into the lungs continuous., Disp: , Rfl:  .  polyethylene glycol (MIRALAX / GLYCOLAX) packet, Take 17 g by mouth daily as needed (constipation)., Disp: , Rfl:  .  tiotropium (SPIRIVA HANDIHALER) 18 MCG inhalation capsule, Place 1 capsule (18 mcg total) into inhaler and inhale daily., Disp: 30 capsule, Rfl: 12 .  tiZANidine (ZANAFLEX) 4 MG tablet, Take 4 mg by mouth 3 (three) times daily., Disp: , Rfl:  .  Water For Irrigation, Sterile (FREE WATER) SOLN, Place 100 mLs into feeding tube every 8 (eight) hours., Disp: 1000 mL, Rfl: 0 .  zinc oxide (BALMEX) 11.3 % CREA cream, Apply 1 application topically 3 (three) times daily as needed (for skin redness/irritation)., Disp: , Rfl:  .  zolpidem (AMBIEN) 5 MG tablet, Take 2 tablets (10 mg total) by mouth at bedtime as needed for sleep., Disp: 20 tablet, Rfl: 0   Allergies  Allergen Reactions  . Erythromycin Nausea And Vomiting  . Nitrofurantoin Monohyd Macro Nausea And Vomiting  . Penicillins Hives    Has patient had a PCN reaction causing immediate rash, facial/tongue/throat swelling, SOB or lightheadedness with hypotension: Yes Has patient had a PCN reaction causing severe rash involving mucus membranes or skin necrosis: Yes Has patient had a PCN reaction that required hospitalization Yes- went to the DR may have been from amoxicillin Has patient had a PCN reaction occurring within the last 10 years: No-childhood allergy If all of the above answers are "NO", then may proceed with  Cephalosporin    Review of Systems:   Pertinent items are noted in the HPI. Otherwise, ROS is negative.  Vitals:   Vitals:   09/11/16 1043  BP: 124/78  Pulse: 92  Temp: 98.2 F (36.8 C)  TempSrc: Oral  SpO2: 97%     There is no height or weight on file to calculate BMI.   Physical Exam:   Physical Exam  Constitutional: She appears well-developed and well-nourished. No distress.  Quadriplegic.   HENT:  Head: Normocephalic and atraumatic.  Right Ear: External ear normal.  Left Ear: External ear normal.  Nose: Nose normal.  Mouth/Throat: Oropharynx is clear and moist.  Eyes: Pupils are equal, round, and reactive to light. Conjunctivae and EOM are normal.  Neck: No thyromegaly present.  Cardiovascular: Normal rate and normal heart sounds.   Pulmonary/Chest: Effort normal.  Abdominal: Soft.  Lymphadenopathy:    She has no cervical adenopathy.  Neurological: She is alert.  Skin: Skin is warm.  Psychiatric: She has a normal mood and affect. Her behavior is normal.  Nursing note and vitals reviewed.  Results for orders placed or performed in visit on 09/11/16  CBC with Differential/Platelet  Result Value Ref Range   WBC 16.0 (H) 4.0 - 10.5 K/uL   RBC 3.51 (L) 3.87 - 5.11 Mil/uL   Hemoglobin 10.7 (L) 12.0 - 15.0 g/dL   HCT 32.5 (L) 36.0 - 46.0 %   MCV 92.6 78.0 - 100.0 fl   MCHC 32.9 30.0 - 36.0 g/dL   RDW 14.7 11.5 - 15.5 %   Platelets 525.0 (H) 150.0 - 400.0 K/uL   Neutrophils Relative % 74.5 43.0 - 77.0 %   Lymphocytes Relative 16.7 12.0 - 46.0 %   Monocytes Relative 5.1 3.0 - 12.0 %   Eosinophils Relative 3.3 0.0 - 5.0 %   Basophils Relative 0.4 0.0 - 3.0 %   Neutro Abs 11.9 (H) 1.4 - 7.7 K/uL   Lymphs Abs 2.7 0.7 - 4.0 K/uL   Monocytes Absolute 0.8 0.1 - 1.0 K/uL   Eosinophils Absolute 0.5 0.0 - 0.7 K/uL   Basophils Absolute 0.1 0.0 - 0.1 K/uL  Comprehensive metabolic panel  Result Value  Ref Range   Sodium 136 135 - 145 mEq/L   Potassium 3.8 3.5 - 5.1  mEq/L   Chloride 89 (L) 96 - 112 mEq/L   CO2 39 (H) 19 - 32 mEq/L   Glucose, Bld 59 (L) 70 - 99 mg/dL   BUN 35 (H) 6 - 23 mg/dL   Creatinine, Ser 0.48 0.40 - 1.20 mg/dL   Total Bilirubin 0.2 0.2 - 1.2 mg/dL   Alkaline Phosphatase 246 (H) 39 - 117 U/L   AST 29 0 - 37 U/L   ALT 46 (H) 0 - 35 U/L   Total Protein 8.4 (H) 6.0 - 8.3 g/dL   Albumin 4.2 3.5 - 5.2 g/dL   Calcium 10.7 (H) 8.4 - 10.5 mg/dL   GFR 147.99 >60.00 mL/min  TSH  Result Value Ref Range   TSH 0.77 0.35 - 4.50 uIU/mL  Vitamin B12  Result Value Ref Range   Vitamin B-12 >1500 (H) 211 - 911 pg/mL  VITAMIN D 25 Hydroxy (Vit-D Deficiency, Fractures)  Result Value Ref Range   VITD 23.02 (L) 30.00 - 100.00 ng/mL   Assessment and Plan:   Alle[place] was seen today for establish care.  Diagnoses and all orders for this visit:  Fatigue, unspecified type -     CBC with Differential/Platelet -     Comprehensive metabolic panel -     TSH -     Vitamin B12 -     VITAMIN D 25 Hydroxy (Vit-D Deficiency, Fractures)  Low vitamin D level -     VITAMIN D 25 Hydroxy (Vit-D Deficiency, Fractures)  Vitamin B 12 deficiency -     Vitamin B12  Need for immunization against influenza -     Flu Vaccine QUAD 36+ mos IM  Anxiety Comments: No benzodiazepines as on opiods and with history of drug abuse.   Alteration in nutrition associated with tube feeding Comments: Will ask RD to help with addressing nutrition status. Will recheck labs. See RN note to [place].  Decubitus ulcer of sacral region, stage 4 ([place]) Comments: Followed by Wound Care.  Tobacco abuse Comments: Precontemplative stage.  Esophageal dysphagia Comments: Eating and drinking soda, though she has been warned of her high aspiration risk.  Protein-calorie malnutrition, severe ([place])  Bandemia Comments: Known decubitus ulcers.  Abnormal transaminases Comments: Monitor.  Hypoalbuminemia   . Reviewed expectations re: course of current medical  issues. . Discussed self-management of symptoms. . Outlined signs and symptoms indicating need for more acute intervention. . Patient verbalized understanding and all questions were answered. Marland Kitchen Health Maintenance issues including appropriate healthy diet, exercise, and smoking avoidance were discussed with patient. . See orders for this visit as documented in the electronic medical record. . Patient received an After Visit Summary.  CMA served as Education administrator during this visit. History, Physical, and Plan performed by medical provider. The above documentation has been reviewed and is accurate and complete. Briscoe Deutscher, D.O.  Records requested if needed. Time spent with the patient: 60 minutes, of which >50% was spent in obtaining information about her symptoms, reviewing her previous labs, evaluations, and treatments, counseling her about her condition (please see the discussed topics above), and developing a plan to further investigate it; she had a number of questions which I addressed.   Briscoe Deutscher, DO [place] [place] 09/21/2016  Future Appointments Date Time Provider [place]  10/09/2016 10:00 AM Briscoe Deutscher, DO [facility] None

## 2016-09-19 ENCOUNTER — Encounter: Payer: Self-pay | Admitting: *Deleted

## 2016-09-19 ENCOUNTER — Telehealth: Payer: Self-pay | Admitting: *Deleted

## 2016-09-19 DIAGNOSIS — R5383 Other fatigue: Secondary | ICD-10-CM

## 2016-09-19 DIAGNOSIS — R1310 Dysphagia, unspecified: Secondary | ICD-10-CM

## 2016-09-19 NOTE — Telephone Encounter (Signed)
Spoke with pts mother and asked who they get Home Health through. I then called [facility] who states that they do not provide her tube feedings   This encounter was created in error - please disregard.

## 2016-09-19 NOTE — Telephone Encounter (Signed)
Called pt and pts mother answer (on DPR). Went over lab results and asked who takes care of patients tube feedings. Pts mother states that [employer]) does her tube feedings. I called [facility] and they only provide nursing services. They state that the pt does not allow for them to administer as much tube feeding as ordered by [place] and they also limit the amount of water they are allowed to administer. They also state that she eats and drinks (mostly soda) and her last swallow study suggested aspiration.  I then called [facility] and pt is supposed to take 6 cans Osmolite 1.2 per day and supplement with Promod. I updated Dr [name] on this information. Awaiting call back from pt and her mother.  Order placed for swallowing study per Dr [name]. Future lab orders placed.

## 2016-09-19 NOTE — Telephone Encounter (Signed)
-----   Message from Briscoe Deutscher, DO sent at 09/19/2016  9:41 AM EDT ----- Low vitamin D. Will need to supplement. Vitamin B okay. I am very concerned about CMP. Abnormalities possibly from incorrect nutrition. I am making Nutrition evaluation a priority. Pain management said no to benzodiazepine. I agree after reviewing her chart more closely. Will consider another medication to help with anxiety. Again - nutrition is the first priority here. Recheck labs in 1 week. Okay HH draw if possible.

## 2016-09-21 ENCOUNTER — Encounter: Payer: Self-pay | Admitting: Family Medicine

## 2016-09-23 ENCOUNTER — Telehealth: Payer: Self-pay | Admitting: Family Medicine

## 2016-09-23 LAB — BASIC METABOLIC PANEL
BUN: 35 — AB (ref 4–21)
CREATININE: 0.5 (ref 0.5–1.1)
GLUCOSE: 136
POTASSIUM: 3.5 (ref 3.4–5.3)
POTASSIUM: 3.5 (ref 3.4–5.3)
Sodium: 136 — AB (ref 137–147)

## 2016-09-23 LAB — HEPATIC FUNCTION PANEL
ALT: 37 — AB (ref 7–35)
ALT: 37 — AB (ref 7–35)
AST: 31 (ref 13–35)
AST: 31 (ref 13–35)
Alkaline Phosphatase: 195 — AB (ref 25–125)
Bilirubin, Total: 0.2

## 2016-09-23 LAB — CBC AND DIFFERENTIAL
HEMATOCRIT: 36 (ref 36–46)
HEMOGLOBIN: 11.5 — AB (ref 12.0–16.0)
Neutrophils Absolute: 7888
PLATELETS: 472 — AB (ref 150–399)
WBC: 11.6

## 2016-09-23 NOTE — Telephone Encounter (Signed)
Spoke to [facility] with [facility], he states that they will draw a CBC with diff and a CMP today or tomorrow morning and fax the results to the office. Attempted to call pt back to update her and did not receive an answer. No message left due to [facility] stating not to leave VM.

## 2016-09-23 NOTE — Telephone Encounter (Signed)
Calling back RE lab results.   Ty,  -LL

## 2016-09-23 NOTE — Telephone Encounter (Signed)
See previous note

## 2016-09-23 NOTE — Telephone Encounter (Signed)
Spoke to Angela Page and explained lab results. Explained that Dr [name] wants to recheck them. Angela Page states that [facility] has drawn her blood before. I called [facility] and the clinical manager will call me back to discuss labs.

## 2016-09-23 NOTE — Telephone Encounter (Signed)
FYI

## 2016-09-26 ENCOUNTER — Telehealth: Payer: Self-pay | Admitting: Family Medicine

## 2016-09-26 NOTE — Telephone Encounter (Signed)
Dr. [name], pt calling for refills on Tizanidine and Gabapentin. Never filled by you, okay to refill?

## 2016-09-26 NOTE — Telephone Encounter (Signed)
MEDICATION:  tiZANidine (ZANAFLEX) 4 MG tablet [[id number]]  gabapentin (NEURONTIN) 400 MG capsule [[id number]]    PHARMACY:  [id number] [address], [place] [address] Phone: [phone] Fax: [phone]     IS THIS A 90 DAY SUPPLY : No  IS PATIENT OUT OF MEDICATION:  No  IF NOT; HOW MUCH IS LEFT: 2 days  LAST APPOINTMENT DATE: 09/11/16  NEXT APPOINTMENT DATE: 10/09/16  OTHER COMMENTS:    **Let patient know to contact pharmacy at the end of the day to make sure medication is ready. **  ** Please notify patient to allow 48-72 hours to process**  **Encourage patient to contact the pharmacy for refills or they can request refills through [facility]**

## 2016-09-27 MED ORDER — TIZANIDINE HCL 4 MG PO TABS: 4.0000 mg | ORAL_TABLET | Freq: Three times a day (TID) | ORAL | 3 refills | Status: DC

## 2016-09-27 MED ORDER — GABAPENTIN 400 MG PO CAPS: 400.0000 mg | ORAL_CAPSULE | Freq: Four times a day (QID) | ORAL | 6 refills | Status: DC

## 2016-09-27 NOTE — Telephone Encounter (Signed)
Sent to pharmacy. Thanks! EW

## 2016-09-29 ENCOUNTER — Encounter (HOSPITAL_BASED_OUTPATIENT_CLINIC_OR_DEPARTMENT_OTHER): Payer: Medicare Other | Attending: Internal Medicine

## 2016-09-29 DIAGNOSIS — L89154 Pressure ulcer of sacral region, stage 4: Secondary | ICD-10-CM | POA: Insufficient documentation

## 2016-09-29 DIAGNOSIS — G8253 Quadriplegia, C5-C7 complete: Secondary | ICD-10-CM | POA: Insufficient documentation

## 2016-10-06 IMAGING — DX DG CHEST 1V PORT
1 series · 1 of 1 positions shown · non-contrast
Comparison: One-view chest x-ray 08/01/2014.

CLINICAL DATA: Unresponsive.  Altered mental status.

EXAM:
PORTABLE CHEST - 1 VIEW

[chest ap]
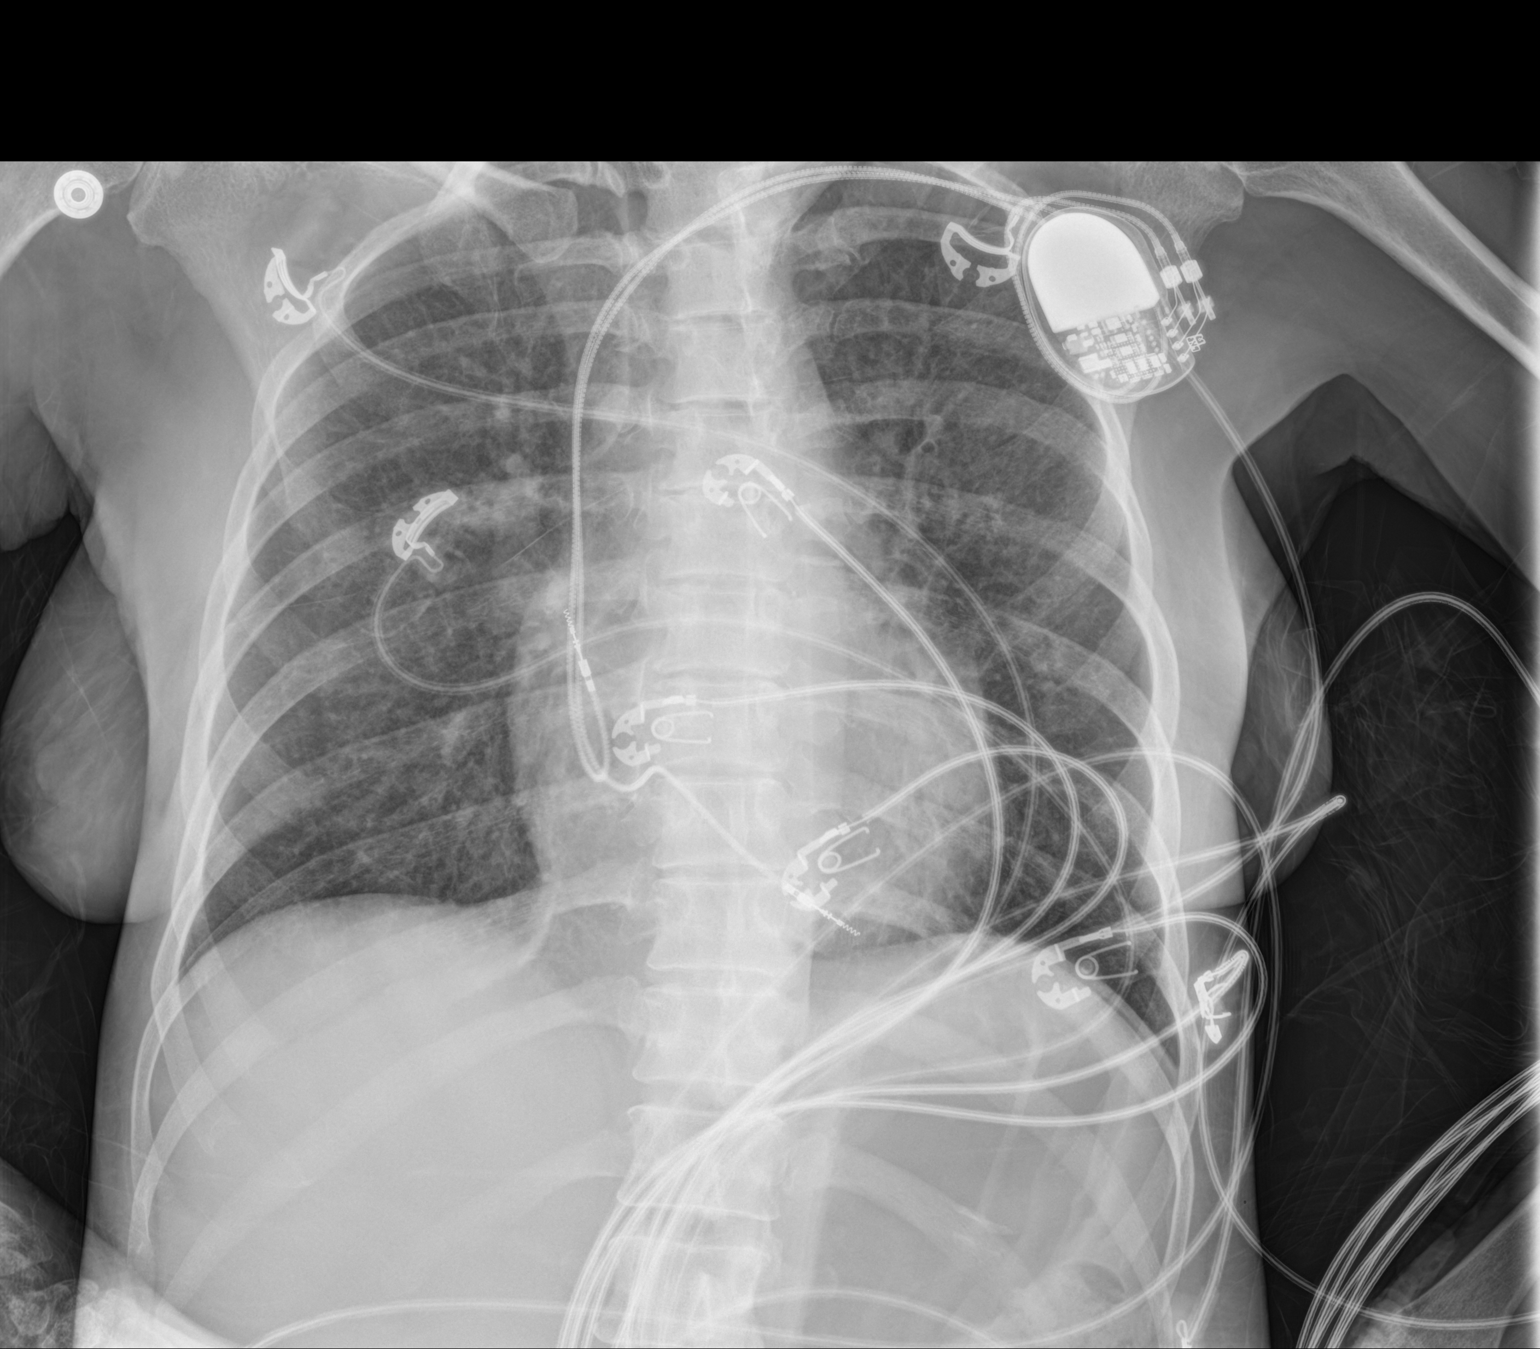

[1 of 1 positions shown; findings below may reference images not displayed]

FINDINGS: Heart size is normal. Mild interstitial edema is present. There is
no effusion. Pacing wires are stable in position. The visualized
soft tissues and bony thorax are unremarkable.
IMPRESSION: 1. Mild interstitial edema without focal airspace disease.

## 2016-10-06 NOTE — Telephone Encounter (Signed)
Spoke to [facility] regarding lab results not being received yet. They state they will fax them this afternoon or in the morning ATTN to Dr [name].

## 2016-10-07 IMAGING — CR DG CHEST 2V
1 series · 1 of 1 positions shown · non-contrast
Comparison: Chest radiograph performed 09/02/2014

CLINICAL DATA: Acute onset of fever.  Initial encounter.

EXAM:
CHEST  2 VIEW

[view not recorded]
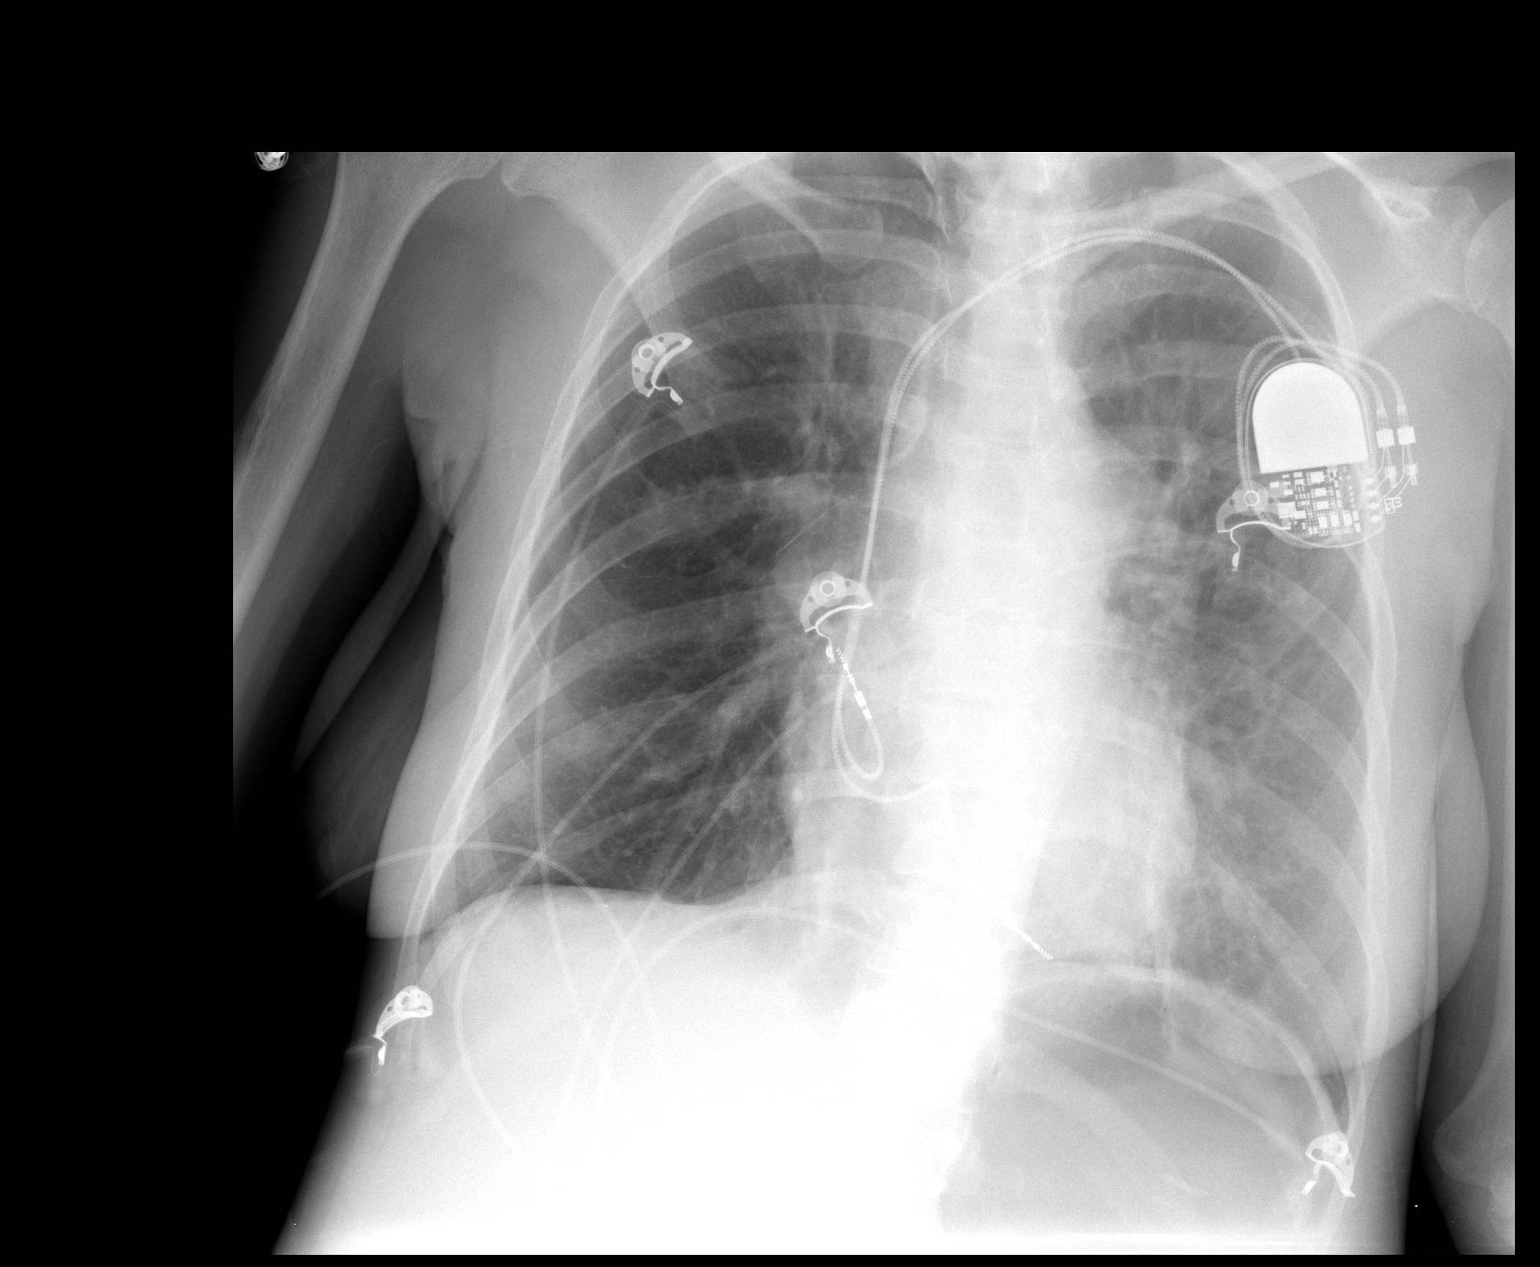

[1 of 1 positions shown; findings below may reference images not displayed]

FINDINGS: Vaguely increased density at the left lung could reflect mild
infection. No pleural effusion or pneumothorax is seen.

The cardiomediastinal silhouette is normal in size. A pacemaker is
noted at the left chest wall, with leads ending at the right atrium
and right ventricle. No acute osseous abnormalities are identified.
IMPRESSION: Vaguely increased density at the left lung could reflect mild
infection. Would correlate for associated symptoms.

## 2016-10-07 IMAGING — DX DG CHEST 1V PORT
1 series · 1 of 1 positions shown · non-contrast
Comparison: 09/03/2014 and 09/02/2014

CLINICAL DATA: Fever. No chest pain or cough. History of pneumonia
and asthma. Current smoker.

EXAM:
PORTABLE CHEST - 1 VIEW

[chest ap]
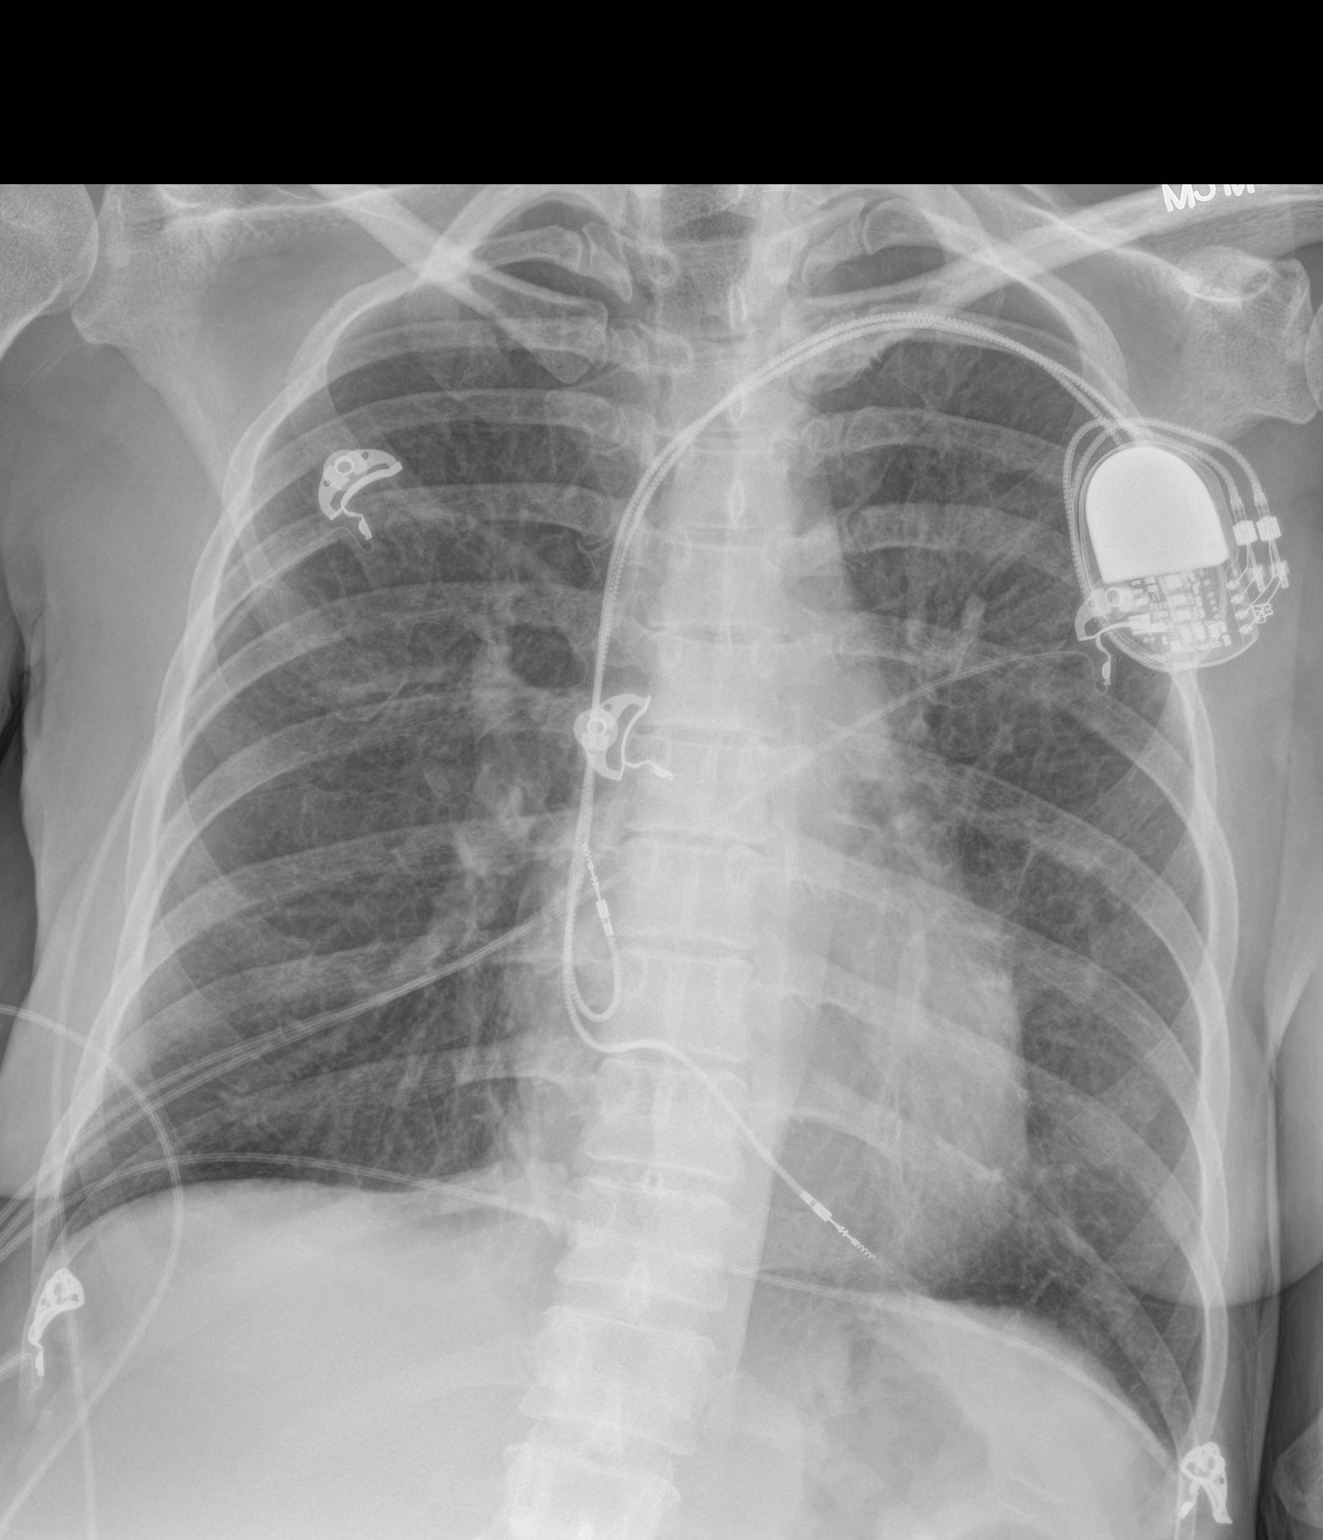

[1 of 1 positions shown; findings below may reference images not displayed]

FINDINGS: Left-sided pacemaker unchanged. Lungs are adequately inflated
without focal consolidation or effusion. Cardiomediastinal
silhouette and remainder of the exam is unchanged.
IMPRESSION: No acute cardiopulmonary disease.

## 2016-10-09 ENCOUNTER — Ambulatory Visit: Payer: Medicare Other | Admitting: Family Medicine

## 2016-10-15 ENCOUNTER — Encounter: Payer: Self-pay | Admitting: Family Medicine

## 2016-10-17 ENCOUNTER — Encounter (HOSPITAL_COMMUNITY): Payer: Self-pay | Admitting: Emergency Medicine

## 2016-10-17 ENCOUNTER — Emergency Department (HOSPITAL_COMMUNITY)
Admission: EM | Admit: 2016-10-17 | Discharge: 2016-10-17 | Disposition: A | Discharge status: Home / Self Care | Payer: Medicare Other | Attending: Emergency Medicine | Admitting: Emergency Medicine

## 2016-10-17 ENCOUNTER — Encounter: Payer: Self-pay | Admitting: Family Medicine

## 2016-10-17 ENCOUNTER — Emergency Department (HOSPITAL_COMMUNITY): Payer: Medicare Other

## 2016-10-17 DIAGNOSIS — F17210 Nicotine dependence, cigarettes, uncomplicated: Secondary | ICD-10-CM | POA: Diagnosis not present

## 2016-10-17 DIAGNOSIS — R110 Nausea: Secondary | ICD-10-CM | POA: Insufficient documentation

## 2016-10-17 DIAGNOSIS — J449 Chronic obstructive pulmonary disease, unspecified: Secondary | ICD-10-CM | POA: Diagnosis not present

## 2016-10-17 DIAGNOSIS — Z950 Presence of cardiac pacemaker: Secondary | ICD-10-CM | POA: Diagnosis not present

## 2016-10-17 DIAGNOSIS — R0602 Shortness of breath: Secondary | ICD-10-CM | POA: Diagnosis not present

## 2016-10-17 DIAGNOSIS — I5032 Chronic diastolic (congestive) heart failure: Secondary | ICD-10-CM | POA: Diagnosis not present

## 2016-10-17 DIAGNOSIS — K5900 Constipation, unspecified: Secondary | ICD-10-CM | POA: Diagnosis not present

## 2016-10-17 DIAGNOSIS — N390 Urinary tract infection, site not specified: Secondary | ICD-10-CM

## 2016-10-17 DIAGNOSIS — R05 Cough: Secondary | ICD-10-CM | POA: Diagnosis not present

## 2016-10-17 DIAGNOSIS — Z79899 Other long term (current) drug therapy: Secondary | ICD-10-CM | POA: Diagnosis not present

## 2016-10-17 DIAGNOSIS — Y829 Unspecified medical devices associated with adverse incidents: Secondary | ICD-10-CM | POA: Insufficient documentation

## 2016-10-17 DIAGNOSIS — T83512A Infection and inflammatory reaction due to nephrostomy catheter, initial encounter: Secondary | ICD-10-CM | POA: Insufficient documentation

## 2016-10-17 DIAGNOSIS — J45909 Unspecified asthma, uncomplicated: Secondary | ICD-10-CM | POA: Insufficient documentation

## 2016-10-17 LAB — CBC WITH DIFFERENTIAL/PLATELET
BASOS ABS: 0 10*3/uL (ref 0.0–0.1)
BASOS PCT: 0 %
EOS PCT: 4 %
Eosinophils Absolute: 0.5 10*3/uL (ref 0.0–0.7)
HEMATOCRIT: 36.3 % (ref 36.0–46.0)
Hemoglobin: 12.2 g/dL (ref 12.0–15.0)
LYMPHS PCT: 21 %
Lymphs Abs: 2.5 10*3/uL (ref 0.7–4.0)
MCH: 30.7 pg (ref 26.0–34.0)
MCHC: 33.6 g/dL (ref 30.0–36.0)
MCV: 91.4 fL (ref 78.0–100.0)
Monocytes Absolute: 0.9 10*3/uL (ref 0.1–1.0)
Monocytes Relative: 7 %
NEUTROS ABS: 8.4 10*3/uL — AB (ref 1.7–7.7)
Neutrophils Relative %: 68 %
PLATELETS: 444 10*3/uL — AB (ref 150–400)
RBC: 3.97 MIL/uL (ref 3.87–5.11)
RDW: 15.1 % (ref 11.5–15.5)
WBC: 12.3 10*3/uL — AB (ref 4.0–10.5)

## 2016-10-17 LAB — CHLORIDE: CHLORIDE: 85

## 2016-10-17 LAB — COMPREHENSIVE METABOLIC PANEL
ALBUMIN: 4.2 g/dL (ref 3.5–5.0)
ALT: 34 U/L (ref 14–54)
AST: 35 U/L (ref 15–41)
Alkaline Phosphatase: 123 U/L (ref 38–126)
Anion gap: 14 (ref 5–15)
BUN: 42 mg/dL — AB (ref 6–20)
CHLORIDE: 77 mmol/L — AB (ref 101–111)
CO2: 45 mmol/L — ABNORMAL HIGH (ref 22–32)
CREATININE: 0.6 mg/dL (ref 0.44–1.00)
Calcium: 10.5 mg/dL — ABNORMAL HIGH (ref 8.9–10.3)
GFR calc Af Amer: >60 mL/min (ref >60)
GLUCOSE: 96 mg/dL (ref 65–99)
Potassium: 2.9 mmol/L — ABNORMAL LOW (ref 3.5–5.1)
Sodium: 136 mmol/L (ref 135–145)
Total Bilirubin: 0.4 mg/dL (ref 0.3–1.2)
Total Protein: 9.2 g/dL — ABNORMAL HIGH (ref 6.5–8.1)

## 2016-10-17 LAB — CALCIUM: CALCIUM: 10.4

## 2016-10-17 LAB — URINALYSIS, ROUTINE W REFLEX MICROSCOPIC
Bilirubin Urine: NEGATIVE
GLUCOSE, UA: NEGATIVE mg/dL
KETONES UR: NEGATIVE mg/dL
Nitrite: NEGATIVE
PROTEIN: 100 mg/dL — AB
Specific Gravity, Urine: 1.011 (ref 1.005–1.030)
pH: 5 (ref 5.0–8.0)

## 2016-10-17 LAB — ALKALINE PHOSPHATASE: Alkaline Phosphatase: 195

## 2016-10-17 LAB — ALBUMIN: Albumin: 4.3

## 2016-10-17 LAB — POC OCCULT BLOOD, ED: FECAL OCCULT BLD: NEGATIVE

## 2016-10-17 LAB — BILIRUBIN, TOTAL: BILIRUBIN TOTAL: 0.2

## 2016-10-17 LAB — PROTEIN, TOTAL: Total Protein: 8.1 g/dL

## 2016-10-17 MED ORDER — POTASSIUM CHLORIDE CRYS ER 20 MEQ PO TBCR: 40.0000 meq | EXTENDED_RELEASE_TABLET | Freq: Once | ORAL | Status: DC

## 2016-10-17 MED ORDER — POTASSIUM CHLORIDE 20 MEQ/15ML (10%) PO SOLN
40.0000 meq | Freq: Once | ORAL | Status: AC
  Administered 2016-10-17: 40 meq via ORAL

## 2016-10-17 MED ORDER — SORBITOL 70 % SOLN
960.0000 mL | TOPICAL_OIL | Freq: Once | ORAL | Status: AC
  Administered 2016-10-17: 960 mL via RECTAL
  Filled 2016-10-17: qty 240

## 2016-10-17 MED ORDER — POTASSIUM CHLORIDE 20 MEQ/15ML (10%) PO SOLN
ORAL | Status: AC
  Administered 2016-10-17: 40 meq via ORAL
  Filled 2016-10-17: qty 30

## 2016-10-17 MED ORDER — POLYETHYLENE GLYCOL 3350 17 G PO PACK: 17.0000 g | PACK | Freq: Every day | ORAL | 0 refills | Status: DC

## 2016-10-17 MED ORDER — CEPHALEXIN 500 MG PO CAPS: 500.0000 mg | ORAL_CAPSULE | Freq: Three times a day (TID) | ORAL | 0 refills | Status: DC

## 2016-10-17 NOTE — ED Provider Notes (Signed)
Angela Page Provider Note   CSN: [id number] Arrival date & time: 10/17/16  1138     History   Chief Complaint Chief Complaint  Patient presents with  . Abdominal Pain  . Shortness of Breath    HPI Angela Page is a 46 y.o. female.  HPI   46 year old female with extensive medical history including quadriplegia, polysubstance abuse on chronic opiate, anasarca, asthma brought here via EMS from home for evaluation of abdominal pain, constipation, and shortness of breath. Patient admits that she has history of recurrent constipation due to being on opiate medication on regular basis. She normally have a bowel movement once every 3 days. For the past 12 days she has not had a normal bowel movement. States that she is able to have symptoms small bowel movement but feels constipated, and feels that her abdomen is distended. She now complaining of pain to her epigastric region, and having cough productive with yellow sputum. She feels like she cannot take a deep breath, and felt is related to her constipation. She has tried taking Dulcolax, and milk of magnesium without adequate relief. She is mostly bed bound and does receive home health care. No report of fever, lightheadedness, dizziness, hemoptysis, dysuria. She does have a suprapubic catheter.  Past Medical History:  Diagnosis Date  . Anasarca 06/10/2016  . Asthma   . Cocaine use 10/08/2013  . GERD (gastroesophageal reflux disease)   . HCAP (healthcare-associated pneumonia) 06/09/2016  . Overdose of opiate or related narcotic 09/02/2014  . Pacemaker   . Pleurisy   . Polysubstance dependence including opioid type drug, episodic abuse ([place]) 10/27/2013  . Protein calorie malnutrition ([place]) 10/31/2015  . Quadriparesis ([place])   . Quadriplegia, C5-C7 incomplete ([place]) 10/08/2013  . Stage IV pressure ulcer of sacral region [facility]) 10/31/2015    Patient Active Problem List   Diagnosis Date Noted  . Ineffective airway clearance     . Autonomic neuropathy 06/10/2016  . Presence of permanent cardiac pacemaker 06/10/2016  . 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 Chronically on opiate therapy 06/10/2016  . Indwelling Foley catheter present 06/10/2016  . PEG (percutaneous endoscopic gastrostomy) status ([place]) 06/10/2016  . Pulmonary hypertension ([place]) 06/10/2016  . Hypoalbuminemia 06/10/2016  . Abnormal transaminases   . Peripheral edema   . Tracheostomy dependence ([place])   . Chronic obstructive pulmonary disease ([place])   . Chronic diastolic CHF (congestive heart failure) ([place]) 01/23/2016  . Tracheostomy status ([place])   . Esophageal dysphagia   . Malnutrition of moderate degree 11/01/2015  . Tobacco abuse 10/31/2015  . Asthma 10/31/2015  . History of pneumonia 10/08/2015  . Pressure ulcer of contiguous region involving buttock and hip, stage 4 ([place]) 02/26/2015  . Hypotension 02/25/2015  . Leukocytosis 10/23/2014  . Hypokalemia 10/23/2014  . Decubitus ulcer of sacral region, stage 4 ([place]) 10/23/2014  . Anemia, iron deficiency 10/23/2014  . Quadriplegia, C5-C7 incomplete ([place]) 10/22/2014  . Depression   . Protein-calorie malnutrition, severe ([place]) 09/05/2014  . 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 Recurrent UTI 03/31/2014  . Pressure ulcer of coccygeal region, stage 4 ([place]) 01/06/2014  . Stage 4 skin ulcer of sacral region ([place]) 01/06/2014  . Neuropathic pain 11/30/2013  . Paraplegia following spinal cord injury ([place]) 11/30/2013  . Spinal cord injury, cervical region ([place]) 11/30/2013  . Chronic pain due to injury 11/30/2013  . S/P cervical spinal fusion 10/27/2013  . Tracheostomy care ([place]) 10/27/2013  . 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 SCI (spinal cord  injury) 10/11/2013    Past Surgical History:  Procedure Laterality Date  . APPENDECTOMY    . BACK SURGERY  10/08/2013   fusion of spine, cervical region  . CARDIAC SURGERY    . GASTROSTOMY  11/23/2015  . I&D  EXTREMITY  10/28/2011   Procedure: IRRIGATION AND DEBRIDEMENT EXTREMITY;  Surgeon: Tennis Must, MD;  Location: [place];  Service: Orthopedics;  Laterality: Right;  Irrigation and debridement right middle finger  . INSERTION OF SUPRAPUBIC CATHETER N/A 07/28/2016   Procedure: INSERTION OF SUPRAPUBIC CATHETER;  Surgeon: Franchot Gallo, MD;  Location: [facility];  Service: Urology;  Laterality: N/A;  . IR GENERIC HISTORICAL  11/23/2015   IR GASTROSTOMY TUBE MOD SED 11/23/2015 [facility]-INTERV RAD  . TRACHEOSTOMY      OB History    No data available       Home Medications    Prior to Admission medications   Medication Sig Start Date End Date Taking? Authorizing Provider  acetaminophen (TYLENOL) 500 MG tablet Take 1,000 mg by mouth every 6 (six) hours as needed for mild pain, moderate pain, fever or headache.     [provider]  albuterol (PROVENTIL HFA;VENTOLIN HFA) 108 (90 Base) MCG/ACT inhaler Inhale 1-2 puffs into the lungs every 6 (six) hours as needed for wheezing or shortness of breath.    [provider]  albuterol (PROVENTIL) (2.5 MG/3ML) 0.083% nebulizer solution Take 2.5 mg by nebulization every 6 (six) hours as needed for wheezing or shortness of breath.    [provider]  alum & mag hydroxide-simeth (MAALOX/MYLANTA) 200-200-20 MG/5ML suspension Take 30 mLs by mouth every 6 (six) hours as needed for indigestion or heartburn.    [provider]  baclofen (LIORESAL) 20 MG tablet Take 20 mg by mouth 3 (three) times daily. 12/14/15   [provider]  bisacodyl (DULCOLAX) 5 MG EC tablet Take 5 mg by mouth daily as needed for moderate constipation.    [provider]  [facility] VITAMIN B12 1000 MCG tablet Place 1 tablet (1,000 mcg total) into feeding tube daily. 11/26/15   Florencia Reasons, MD  DULoxetine (CYMBALTA) 30 MG capsule Take 30 mg by mouth 2 (two) times daily. 11/30/15   [provider]  furosemide (LASIX) 20 MG tablet  Take 2 tablets (40 mg total) by mouth daily. 06/13/16   Lovenia Kim, MD  gabapentin (NEURONTIN) 400 MG capsule Take 1 capsule (400 mg total) by mouth 4 (four) times daily. 09/27/16 04/25/17  Briscoe Deutscher, DO  ibuprofen (ADVIL,MOTRIN) 200 MG tablet Place 2 tablets (400 mg total) into feeding tube every 6 (six) hours as needed for moderate pain. Patient taking differently: Place 400-600 mg into feeding tube every 6 (six) hours as needed for fever, headache, mild pain, moderate pain or cramping.  11/26/15   Florencia Reasons, MD  lidocaine (LIDODERM) 5 % Place 1 patch onto the skin 2 (two) times daily as needed (pain).  04/04/15   [provider]  midodrine (PROAMATINE) 10 MG tablet Place 1 tablet (10 mg total) into feeding tube 3 (three) times daily with meals. 11/26/15   Florencia Reasons, MD  Nutritional Supplements (FEEDING SUPPLEMENT, OSMOLITE 1.2 CAL,) LIQD Place 100 mLs into feeding tube See admin instructions. Given for 10 hours at a time.  Given between 2300 - 0900 the next day.    [provider]  Nutritional Supplements (PROMOD) LIQD Take 30 mLs by mouth 2 (two) times daily.    [provider]  oxybutynin (  DITROPAN) 5 MG tablet Take 5 mg by mouth every 8 (eight) hours as needed for bladder spasms. 07/11/16   [provider]  Oxycodone HCl 10 MG TABS Take 10 mg by mouth 4 (four) times daily as needed (for pain).     [provider]  OXYGEN Inhale 3 L into the lungs continuous.    [provider]  polyethylene glycol (MIRALAX / GLYCOLAX) packet Take 17 g by mouth daily as needed (constipation).    [provider]  tiotropium (SPIRIVA HANDIHALER) 18 MCG inhalation capsule Place 1 capsule (18 mcg total) into inhaler and inhale daily. 02/20/16   Erick Colace, NP  tiZANidine (ZANAFLEX) 4 MG tablet Take 1 tablet (4 mg total) by mouth 3 (three) times daily. 09/27/16   Briscoe Deutscher, DO  Water For Irrigation, Sterile (FREE WATER) SOLN Place 100 mLs into feeding  tube every 8 (eight) hours. 11/26/15   Florencia Reasons, MD  zinc oxide (BALMEX) 11.3 % CREA cream Apply 1 application topically 3 (three) times daily as needed (for skin redness/irritation).    [provider]  zolpidem (AMBIEN) 5 MG tablet Take 2 tablets (10 mg total) by mouth at bedtime as needed for sleep. 07/28/16   Franchot Gallo, MD    Family History Family History  Problem Relation Age of Onset  . Hypertension Maternal Uncle   . Kidney failure Maternal Uncle   . Colon cancer Mother   . Lung cancer Father   . Hypertension Brother   . Kidney failure Maternal Aunt   . Kidney failure Maternal Uncle     Social History Social History  Substance Use Topics  . Smoking status: Current Every Day Smoker    Packs/day: 1.00    Years: 25.00    Types: Cigarettes  . Smokeless tobacco: Never Used  . Alcohol use Yes     Comment: 2 x weekly     Allergies   Erythromycin; Nitrofurantoin monohyd macro; and Penicillins   Review of Systems Review of Systems  All other systems reviewed and are negative.    Physical Exam Updated Vital Signs BP 94/67 (BP Location: Right Arm)   Pulse 80   Temp 98.2 F (36.8 C) (Oral)   Resp 16   LMP 05/27/2016 (Within Days) Comment: Irregular periods since October 2015.  SpO2 97%   Physical Exam  Constitutional: No distress.  Chronically ill-appearing female, nontoxic  HENT:  Head: Atraumatic.  Eyes: Conjunctivae are normal.  Neck: Neck supple.  Cardiovascular: Normal rate and regular rhythm.   Pulmonary/Chest:  Decreased breath sounds with poor effort.  Abdominal: She exhibits distension. There is tenderness.  Abdomen is distended, bowel sounds presents, tenderness to epigastric region on palpation without guarding or rebound tenderness. Suprapubic catheter in place with normal surrounding skin changes. PEG tube site appears normal.  Neurological: She is alert.  Quadriplegia, able to move her upper extremity without the use of the hands    Skin: No rash noted.  Psychiatric: She has a normal mood and affect.  Nursing note and vitals reviewed.    ED Treatments / Results  Labs (all labs ordered are listed, but only abnormal results are displayed) Labs Reviewed  CBC WITH DIFFERENTIAL/PLATELET - Abnormal; Notable for the following:       Result Value   WBC 12.3 (*)    Platelets 444 (*)    Neutro Abs 8.4 (*)    All other components within normal limits  COMPREHENSIVE METABOLIC PANEL - Abnormal; Notable for the  following:    Potassium 2.9 (*)    Chloride 77 (*)    CO2 45 (*)    BUN 42 (*)    Calcium 10.5 (*)    Total Protein 9.2 (*)    All other components within normal limits  URINALYSIS, ROUTINE W REFLEX MICROSCOPIC - Abnormal; Notable for the following:    APPearance TURBID (*)    Hgb urine dipstick LARGE (*)    Protein, ur 100 (*)    Leukocytes, UA LARGE (*)    Bacteria, UA MANY (*)    Squamous Epithelial / LPF 6-30 (*)    Crystals PRESENT (*)    All other components within normal limits  URINE CULTURE  POC OCCULT BLOOD, ED    EKG  EKG Interpretation None       Radiology Dg Abd Acute W/chest  Result Date: 10/17/2016 CLINICAL DATA:  Productive cough and abdominal pain EXAM: DG ABDOMEN ACUTE W/ 1V CHEST COMPARISON:  07/28/2016 FINDINGS: Cardiac shadow is stable. Pacing device is again seen. Tracheostomy tube is noted. Mild interstitial changes are noted in the bases although stable from the prior exam. Scattered large and small bowel gas is noted. Gastrostomy catheter is noted in place. No obstructive changes are seen. No bony abnormality is noted. Calcifications noted to the right of the midline consistent with nonobstructing renal stone. No free air is seen. IMPRESSION: Likely right renal calculus. Chronic changes in chest without acute abnormality. Electronically Signed   By: Inez Catalina M.D.   On: 10/17/2016 13:09    Procedures Procedures (including critical care time)  Medications Ordered in  ED Medications  sorbitol, milk of mag, mineral oil, glycerin (SMOG) enema (960 mLs Rectal Given 10/17/16 1420)  potassium chloride 20 MEQ/15ML (10%) solution 40 mEq (40 mEq Oral Given 10/17/16 1421)     Initial Impression / Assessment and Plan / ED Course  I have reviewed the triage vital signs and the nursing notes.  Pertinent labs & imaging results that were available during my care of the patient were reviewed by me and considered in my medical decision making (see chart for details).     BP 94/67 (BP Location: Right Arm)   Pulse 80   Temp 98.2 F (36.8 C) (Oral)   Resp 16   LMP 05/27/2016 (Within Days) Comment: Irregular periods since October 2015.  SpO2 97%    Final Clinical Impressions(s) / ED Diagnoses   Final diagnoses:  Urinary tract infection associated with nephrostomy catheter, initial encounter ([place])  Constipation, unspecified constipation type    New Prescriptions New Prescriptions   No medications on file   12:20 PM Patient is quadriplegic here with constipation, likely opiate-induced-she takes opiate medication on a regular basis. She also complaining of some shortness of breath and having epigastric abdominal pain along with cough productive with yellow sputum. Will obtain an acute abdominal series with chest. Will perform digital rectal exam. She does endorse nausea without vomiting. Patient states she does not pass flatus but this is not unusual for her.  12:28 PM I have perform a digital rectal exam, there is a large ball of stool that was able to retrieve however, patient will benefit from a smog enema to help with her constipation.  3:27 PM We have able to remove 5 large stool balls from pt's rectum.  We attempted SMOG enema, however pt unable to hold the enema in place due to lack of anal sphincter.  Her UA is concerning for UTI, however it is  difficult to ellucidate as pt has suprapubic catheter.  Given abd pain, will prescribe abx.  Pt sts she tolerates  keflex in the past without alergic reaction.  Will prescribe keflex, miralax and pt will f/u with PCP for further care.  Care discussed with Dr. Eulis Foster.  Return precaution given.     Domenic Moras, PA-C 10/17/16 1529    Daleen Bo, MD 10/20/16 [phone]

## 2016-10-17 NOTE — ED Triage Notes (Signed)
Patient here from home via EMS with complaints of generalized abdominal pain. Reports that she hasn't had a bowel movement in 12 days, stool softeners, laxatives with no relief. . Also reports SOB, normally wears 3L O2 constantly, productive cough x4 days.

## 2016-10-17 NOTE — ED Notes (Signed)
Bed: KG25 Expected date:  Expected time:  Means of arrival:  Comments: EMS- quadraplegic/constipation

## 2016-10-20 ENCOUNTER — Ambulatory Visit: Payer: Medicare Other | Admitting: Family Medicine

## 2016-10-20 LAB — URINE CULTURE: Culture: >=100000 — AB

## 2016-10-21 NOTE — Progress Notes (Signed)
ED Antimicrobial Stewardship Positive Culture Follow Up   Angela Page is an 46 y.o. female who presented to [facility] on 10/17/2016 with a chief complaint of  Chief Complaint  Patient presents with  . Abdominal Pain  . Shortness of Breath    Recent Results (from the past 720 hour(s))  Urine Culture     Status: Abnormal   Collection Time: 10/17/16  1:43 PM  Result Value Ref Range Status   Specimen Description URINE, RANDOM  Final   Special Requests NONE  Final   Culture (A)  Final    >=100,000 COLONIES/mL ENTEROCOCCUS FAECALIS >=100,000 COLONIES/mL PSEUDOMONAS AERUGINOSA    Report Status 10/20/2016 FINAL  Final   Organism ID, Bacteria ENTEROCOCCUS FAECALIS (A)  Final   Organism ID, Bacteria PSEUDOMONAS AERUGINOSA (A)  Final      Susceptibility   Enterococcus faecalis - MIC*    AMPICILLIN <=2 SENSITIVE Sensitive     LEVOFLOXACIN >=8 RESISTANT Resistant     NITROFURANTOIN <=16 SENSITIVE Sensitive     VANCOMYCIN 1 SENSITIVE Sensitive     * >=100,000 COLONIES/mL ENTEROCOCCUS FAECALIS   Pseudomonas aeruginosa - MIC*    CEFTAZIDIME 4 SENSITIVE Sensitive     CIPROFLOXACIN >=4 RESISTANT Resistant     GENTAMICIN 8 INTERMEDIATE Intermediate     IMIPENEM 1 SENSITIVE Sensitive     PIP/TAZO 16 SENSITIVE Sensitive     CEFEPIME 8 SENSITIVE Sensitive     * >=100,000 COLONIES/mL PSEUDOMONAS AERUGINOSA   Pt treated in ED for opiate induced constipation with no urinary symptoms in the setting of chronic cath, no treatment indicated at this time.  Plan to d/c cephalexin.  ED Provider: [place] Russo PA-C  Bertis Ruddy, PharmD Pharmacy Resident Pager #: [phone] 10/21/2016 9:50 AM

## 2016-10-21 NOTE — Telephone Encounter (Signed)
Post ED Visit - Positive Culture Follow-up: Successful Patient Follow-Up  Culture assessed and recommendations reviewed by: []  Elenor Quinones, Pharm.D. []  Heide Guile, Pharm.D., BCPS AQ-ID []  Parks Neptune, Pharm.D., BCPS []  Alycia Rossetti, Pharm.D., BCPS []  [place], Pharm.D., BCPS, AAHIVP []  Legrand Como, Pharm.D., BCPS, AAHIVP []  Salome Arnt, PharmD, BCPS []  Dimitri Ped, PharmD, BCPS []  Vincenza Hews, PharmD, BCPS Pearson Grippe Pharm D  Positive urine culture  []  Patient discharged without antimicrobial prescription and treatment is now indicated []  Organism is resistant to prescribed ED discharge antimicrobial []  Patient with positive blood cultures  Changes discussed with ED provider: [place] Russo PharmD New antibiotic prescription stop Cephalexin d/t asymptomatic Attempting to contact patient    Hazle Nordmann 10/21/2016, 2:57 PM

## 2016-10-28 ENCOUNTER — Telehealth: Payer: Self-pay | Admitting: Family Medicine

## 2016-10-28 NOTE — Telephone Encounter (Signed)
Please advise on refill.

## 2016-10-28 NOTE — Telephone Encounter (Signed)
Okay 

## 2016-10-28 NOTE — Telephone Encounter (Signed)
MEDICATION: midodrine (PROAMATINE) 10 MG tablet  PHARMACY:   [id number] [place] [phone] (Phone) [phone] (Fax)   IS THIS A 90 DAY SUPPLY : yes   IS PATIENT OUT OF MEDICATION: no  IF NOT; HOW MUCH IS LEFT: 2 days left  LAST APPOINTMENT DATE: @8 /23/18  NEXT APPOINTMENT DATE:@10 /22/2018  OTHER COMMENTS: request refills   **Let patient know to contact pharmacy at the end of the day to make sure medication is ready. **  ** Please notify patient to allow 48-72 hours to process**  **Encourage patient to contact the pharmacy for refills or they can request refills through [facility]**

## 2016-10-29 MED ORDER — MIDODRINE HCL 10 MG PO TABS: 10.0000 mg | ORAL_TABLET | Freq: Three times a day (TID) | ORAL | 1 refills | Status: DC

## 2016-10-29 NOTE — Telephone Encounter (Signed)
RX has been sent to the pharmacy.

## 2016-11-03 ENCOUNTER — Encounter (HOSPITAL_BASED_OUTPATIENT_CLINIC_OR_DEPARTMENT_OTHER): Payer: Medicare Other | Attending: Internal Medicine

## 2016-11-03 DIAGNOSIS — G8253 Quadriplegia, C5-C7 complete: Secondary | ICD-10-CM | POA: Diagnosis not present

## 2016-11-03 DIAGNOSIS — L89154 Pressure ulcer of sacral region, stage 4: Secondary | ICD-10-CM | POA: Insufficient documentation

## 2016-11-03 DIAGNOSIS — L89312 Pressure ulcer of right buttock, stage 2: Secondary | ICD-10-CM | POA: Insufficient documentation

## 2016-11-07 ENCOUNTER — Other Ambulatory Visit: Payer: Self-pay

## 2016-11-07 ENCOUNTER — Telehealth: Payer: Self-pay | Admitting: Family Medicine

## 2016-11-07 MED ORDER — FUROSEMIDE 40 MG PO TABS: 40.0000 mg | ORAL_TABLET | Freq: Every day | ORAL | 3 refills | Status: DC

## 2016-11-07 NOTE — Telephone Encounter (Signed)
Refill

## 2016-11-07 NOTE — Telephone Encounter (Signed)
MEDICATION:   furosemide (LASIX) 20 MG tablet  40 MG not 20MG   PHARMACY:   [id number] [place],  - [address] [phone] (Phone) [phone] (Fax)     IS THIS A 90 DAY SUPPLY : N  IS PATIENT OUT OF MEDICATION: N  IF NOT; HOW MUCH IS LEFT: 2 left  LAST APPOINTMENT DATE: @10 /09/2016  NEXT APPOINTMENT DATE:@10 /22/2018  OTHER COMMENTS: Patient's mother stated Rx was originally prescribed at hospital and dose was changed to 40 MG   **Let patient know to contact pharmacy at the end of the day to make sure medication is ready. **  ** Please notify patient to allow 48-72 hours to process**  **Encourage patient to contact the pharmacy for refills or they can request refills through [facility]**

## 2016-11-07 NOTE — Telephone Encounter (Signed)
Please advise on refill.

## 2016-11-07 NOTE — Telephone Encounter (Signed)
Refill sent to pharmacy.   

## 2016-11-10 ENCOUNTER — Encounter: Payer: Self-pay | Admitting: Family Medicine

## 2016-11-10 ENCOUNTER — Ambulatory Visit (INDEPENDENT_AMBULATORY_CARE_PROVIDER_SITE_OTHER): Payer: Medicare Other | Admitting: Family Medicine

## 2016-11-10 ENCOUNTER — Other Ambulatory Visit: Payer: Self-pay

## 2016-11-10 VITALS — BP 82/56 | HR 81 | Temp 98.1°F

## 2016-11-10 DIAGNOSIS — G4700 Insomnia, unspecified: Secondary | ICD-10-CM

## 2016-11-10 DIAGNOSIS — Z720 Tobacco use: Secondary | ICD-10-CM | POA: Diagnosis not present

## 2016-11-10 DIAGNOSIS — M24549 Contracture, unspecified hand: Secondary | ICD-10-CM | POA: Diagnosis not present

## 2016-11-10 DIAGNOSIS — R110 Nausea: Secondary | ICD-10-CM | POA: Diagnosis not present

## 2016-11-10 DIAGNOSIS — I5032 Chronic diastolic (congestive) heart failure: Secondary | ICD-10-CM | POA: Diagnosis not present

## 2016-11-10 DIAGNOSIS — G8254 Quadriplegia, C5-C7 incomplete: Secondary | ICD-10-CM

## 2016-11-10 DIAGNOSIS — K5900 Constipation, unspecified: Secondary | ICD-10-CM | POA: Diagnosis not present

## 2016-11-10 DIAGNOSIS — J449 Chronic obstructive pulmonary disease, unspecified: Secondary | ICD-10-CM | POA: Diagnosis not present

## 2016-11-10 DIAGNOSIS — G903 Multi-system degeneration of the autonomic nervous system: Secondary | ICD-10-CM | POA: Diagnosis not present

## 2016-11-10 DIAGNOSIS — Z430 Encounter for attention to tracheostomy: Secondary | ICD-10-CM

## 2016-11-10 DIAGNOSIS — Z931 Gastrostomy status: Secondary | ICD-10-CM

## 2016-11-10 MED ORDER — ONDANSETRON HCL 4 MG PO TABS: 4.0000 mg | ORAL_TABLET | Freq: Three times a day (TID) | ORAL | 5 refills | Status: DC | PRN

## 2016-11-10 MED ORDER — POLYETHYLENE GLYCOL 3350 17 GM/SCOOP PO POWD: 17.0000 g | Freq: Every day | ORAL | 1 refills | Status: DC

## 2016-11-10 MED ORDER — MAGNESIUM CITRATE PO SOLN: 1.0000 | Freq: Once | ORAL | 5 refills | Status: AC

## 2016-11-10 MED ORDER — MAGNESIUM HYDROXIDE 400 MG/5ML PO SUSP: 5.0000 mL | Freq: Every day | ORAL | 0 refills | Status: DC | PRN

## 2016-11-10 MED ORDER — MELATONIN 5 MG PO TABS: 5.0000 mg | ORAL_TABLET | Freq: Every day | ORAL | 5 refills | Status: DC

## 2016-11-13 ENCOUNTER — Telehealth: Payer: Self-pay | Admitting: Family Medicine

## 2016-11-13 NOTE — Telephone Encounter (Signed)
Please advise 

## 2016-11-13 NOTE — Telephone Encounter (Signed)
[facility] needs verbal orders and copy needs to be sent to [facility].  Notes need to include order/mention of:  COPD  Overnight Oxysomity Test Oxygen order for her 3 liter per minute continuous Suction Machine  Kangaroo Tube feeding pump order Air Compressor Concentrator and O2 tanks STAT's 88% or less w/o oxygen  She's on an alternate medication other than Oxygen, Spiriva and albuterol.   [facility] ATTNUlyses Jarred Fax: [phone] P: 1 [phone]  [place]: [facility]  Fax: [phone] P: [phone]

## 2016-11-14 NOTE — Telephone Encounter (Signed)
Okay for below.

## 2016-11-15 NOTE — Progress Notes (Signed)
Angela Page is a 46 y.o. female is here for follow up.  History of Present Illness:   HPI:   1. Constipation. Improved since ER visit. Requests prn medications added regimen.   2. Contracture of hand. Needs Neurology order for botox.   3. Insomnia. Unable to fall asleep. Not a candidate for benzos.    4. Nausea. Intermittent and from constipation.    Health Maintenance Due  Topic Date Due  . PAP SMEAR  09/11/1991   No flowsheet data found. PMHx, SurgHx, SocialHx, FamHx, Medications, and Allergies were reviewed in the Visit Navigator and updated as appropriate.   Patient Active Problem List   Diagnosis Date Noted  . Ineffective airway clearance   . Autonomic neuropathy 06/10/2016  . Presence of permanent cardiac pacemaker 06/10/2016  . 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 Chronically on opiate therapy 06/10/2016  . Indwelling Foley catheter present 06/10/2016  . PEG (percutaneous endoscopic gastrostomy) status ([place]) 06/10/2016  . Pulmonary hypertension ([place]) 06/10/2016  . Hypoalbuminemia 06/10/2016  . Abnormal transaminases   . Peripheral edema   . Tracheostomy dependence ([place])   . Chronic obstructive pulmonary disease ([place])   . Chronic diastolic CHF (congestive heart failure) ([place]) 01/23/2016  . Tracheostomy status ([place])   . Esophageal dysphagia   . Malnutrition of moderate degree 11/01/2015  . Tobacco abuse 10/31/2015  . Asthma 10/31/2015  . History of pneumonia 10/08/2015  . Pressure ulcer of contiguous region involving buttock and hip, stage 4 ([place]) 02/26/2015  . Hypotension 02/25/2015  . Leukocytosis 10/23/2014  . Hypokalemia 10/23/2014  . Decubitus ulcer of sacral region, stage 4 ([place]) 10/23/2014  . Anemia, iron deficiency 10/23/2014  . Quadriplegia, C5-C7 incomplete ([place]) 10/22/2014  . Depression   . Protein-calorie malnutrition, severe ([place]) 09/05/2014  . 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 Recurrent UTI  03/31/2014  . Pressure ulcer of coccygeal region, stage 4 ([place]) 01/06/2014  . Stage 4 skin ulcer of sacral region ([place]) 01/06/2014  . Neuropathic pain 11/30/2013  . Paraplegia following spinal cord injury ([place]) 11/30/2013  . Spinal cord injury, cervical region ([place]) 11/30/2013  . Chronic pain due to injury 11/30/2013  . S/P cervical spinal fusion 10/27/2013  . Tracheostomy care ([place]) 10/27/2013  . 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 SCI (spinal cord injury) 10/11/2013   Social History  Substance Use Topics  . Smoking status: Current Every Day Smoker    Packs/day: 1.00    Years: 25.00    Types: Cigarettes  . Smokeless tobacco: Never Used  . Alcohol use Yes     Comment: 2 x weekly   Current Medications and Allergies:   Current Outpatient Prescriptions:  .  acetaminophen (TYLENOL) 500 MG tablet, Take 1,000 mg by mouth every 6 (six) hours as needed for mild pain, moderate pain, fever or headache. , Disp: , Rfl:  .  albuterol (PROVENTIL HFA;VENTOLIN HFA) 108 (90 Base) MCG/ACT inhaler, Inhale 1-2 puffs into the lungs every 6 (six) hours as needed for wheezing or shortness of breath., Disp: , Rfl:  .  albuterol (PROVENTIL) (2.5 MG/3ML) 0.083% nebulizer solution, Take 2.5 mg by nebulization every 6 (six) hours as needed for wheezing or shortness of breath., Disp: , Rfl:  .  alum & mag hydroxide-simeth (MAALOX/MYLANTA) 200-200-20 MG/5ML suspension, Take 30 mLs by mouth every 6 (six) hours as needed for indigestion or heartburn., Disp: , Rfl:  .  baclofen (LIORESAL) 20 MG tablet, Take 20 mg by mouth 3 (three) times daily., Disp: ,  Rfl: 3 .  bisacodyl (DULCOLAX) 5 MG EC tablet, Take 5 mg by mouth daily as needed for moderate constipation., Disp: , Rfl:  .  collagenase (SANTYL) ointment, Apply 1 application topically daily., Disp: , Rfl:  .  DULoxetine (CYMBALTA) 30 MG capsule, Take 30 mg by mouth 2 (two) times daily., Disp: , Rfl:  .  furosemide (LASIX) 40 MG tablet, Take 1 tablet (40 mg  total) by mouth daily., Disp: 30 tablet, Rfl: 3 .  gabapentin (NEURONTIN) 400 MG capsule, Take 1 capsule (400 mg total) by mouth 4 (four) times daily., Disp: 120 capsule, Rfl: 6 .  ibuprofen (ADVIL,MOTRIN) 200 MG tablet, Place 2 tablets (400 mg total) into feeding tube every 6 (six) hours as needed for moderate pain. (Patient taking differently: Place 400-600 mg into feeding tube every 6 (six) hours as needed for fever, headache, mild pain, moderate pain or cramping. ), Disp: 30 tablet, Rfl: 0 .  lidocaine (LIDODERM) 5 %, Place 1 patch onto the skin 2 (two) times daily as needed (pain). , Disp: , Rfl: 0 .  midodrine (PROAMATINE) 10 MG tablet, Place 1 tablet (10 mg total) into feeding tube 3 (three) times daily with meals., Disp: 90 tablet, Rfl: 1 .  Nutritional Supplements (FEEDING SUPPLEMENT, OSMOLITE 1.2 CAL,) LIQD, Place 100 mLs into feeding tube See admin instructions. Given for 10 hours at a time.  Given between 2300 - 0900 the next day., Disp: , Rfl:  .  Nutritional Supplements (PROMOD) LIQD, Take 30 mLs by mouth 2 (two) times daily., Disp: , Rfl:  .  oxybutynin (DITROPAN) 5 MG tablet, Take 5 mg by mouth every 8 (eight) hours as needed for bladder spasms., Disp: , Rfl: 11 .  Oxycodone HCl 10 MG TABS, Take 10 mg by mouth 4 (four) times daily as needed (for pain). , Disp: , Rfl:  .  OXYGEN, Inhale 3 L into the lungs continuous., Disp: , Rfl:  .  Skin Protectants, Misc. (BALMEX SKIN PROTECTANT EX), Apply 1 application topically 3 (three) times daily., Disp: , Rfl:  .  tiotropium (SPIRIVA HANDIHALER) 18 MCG inhalation capsule, Place 1 capsule (18 mcg total) into inhaler and inhale daily., Disp: 30 capsule, Rfl: 12 .  tiZANidine (ZANAFLEX) 4 MG tablet, Take 1 tablet (4 mg total) by mouth 3 (three) times daily., Disp: 90 tablet, Rfl: 3 .  Water For Irrigation, Sterile (FREE WATER) SOLN, Place 100 mLs into feeding tube every 8 (eight) hours., Disp: 1000 mL, Rfl: 0 .  zinc oxide (BALMEX) 11.3 % CREA  cream, Apply 1 application topically 3 (three) times daily as needed (for skin redness/irritation)., Disp: , Rfl:  .  zolpidem (AMBIEN) 5 MG tablet, Take 2 tablets (10 mg total) by mouth at bedtime as needed for sleep., Disp: 20 tablet, Rfl: 0 .  magnesium hydroxide (MILK OF MAGNESIA) 400 MG/5ML suspension, Take 5 mLs by mouth daily as needed for mild constipation., Disp: 360 mL, Rfl: 0    Allergies  Allergen Reactions  . Erythromycin Nausea And Vomiting  . Nitrofurantoin Monohyd Macro Nausea And Vomiting  . Penicillins Hives    Has patient had a PCN reaction causing immediate rash, facial/tongue/throat swelling, SOB or lightheadedness with hypotension: Yes Has patient had a PCN reaction causing severe rash involving mucus membranes or skin necrosis: Yes Has patient had a PCN reaction that required hospitalization No Has patient had a PCN reaction occurring within the last 10 years: No-childhood allergy If all of the above answers are "NO", then  may proceed with Cephalosporin    Review of Systems   Pertinent items are noted in the HPI. Otherwise, ROS is negative.  Vitals:   Vitals:   11/10/16 1112  BP: (!) 82/56  Pulse: 81  Temp: 98.1 F (36.7 C)  TempSrc: Oral  SpO2: 95%     There is no height or weight on file to calculate BMI. Physical Exam:   Physical Exam  Constitutional: She appears well-developed and well-nourished. No distress.  Quadriplegic.   HENT:  Head: Normocephalic and atraumatic.  Right Ear: External ear normal.  Left Ear: External ear normal.  Nose: Nose normal.  Mouth/Throat: Oropharynx is clear and moist.  Eyes: Pupils are equal, round, and reactive to light. Conjunctivae and EOM are normal.  Neck: No thyromegaly present.  Cardiovascular: Normal rate and normal heart sounds.   Pulmonary/Chest: Effort normal.  Abdominal: Soft.  Lymphadenopathy:    She has no cervical adenopathy.  Neurological: She is alert.  Skin: Skin is warm.  Psychiatric: She  has a normal mood and affect. Her behavior is normal.  Nursing note and vitals reviewed.  Assessment and Plan:   Aurore was seen today for follow-up.  Diagnoses and all orders for this visit:  Constipation, unspecified constipation type -     polyethylene glycol powder (GLYCOLAX/MIRALAX) powder; Take 17 g by mouth daily. -     magnesium hydroxide (MILK OF MAGNESIA) 400 MG/5ML suspension; Take 5 mLs by mouth daily as needed for mild constipation. -     magnesium citrate SOLN; Take 296 mLs (1 Bottle total) by mouth once. -     Ambulatory referral to Gastroenterology  Contracture of hand -     Ambulatory referral to Neurology  Insomnia, unspecified type -     Melatonin 5 MG TABS; Take 1 tablet (5 mg total) by mouth at bedtime.  Nausea -     ondansetron (ZOFRAN) 4 MG tablet; Take 1 tablet (4 mg total) by mouth every 8 (eight) hours as needed for nausea or vomiting.  Quadriplegia, C5-C7 incomplete (HCC)  Neurogenic orthostatic hypotension ([place])  Tracheostomy care [facility]) Comments: No concerns today.  Chronic diastolic CHF (congestive heart failure) (HCC)  Chronic obstructive pulmonary disease, unspecified COPD type (HCC)  PEG (percutaneous endoscopic gastrostomy) status ([place])  Tobacco abuse Comments: I advised patient to quit smoking, and offered support.  QUITLINE: 1-800-QUIT-NOW 228-886-5180).    . Reviewed expectations re: course of current medical issues. . Discussed self-management of symptoms. . Outlined signs and symptoms indicating need for more acute intervention. . Patient verbalized understanding and all questions were answered. Marland Kitchen Health Maintenance issues including appropriate healthy diet, exercise, and smoking avoidance were discussed with patient. . See orders for this visit as documented in the electronic medical record. . Patient received an After Visit Summary.  Briscoe Deutscher, DO [place], [facility] 11/15/2016

## 2016-11-17 ENCOUNTER — Telehealth: Payer: Self-pay | Admitting: Family Medicine

## 2016-11-17 NOTE — Telephone Encounter (Signed)
Spoke with Merdis Delay at [facility] and explained that we do not have specific information that they are requesting due to only seeing the patient twice. We have never performed pulse oximetry testing so we do not have these results. She stated that she would let [place] know this.

## 2016-11-17 NOTE — Telephone Encounter (Signed)
Angela Page from [facility] called to advise that [facility] needs specific information in order to give the patient her equipment. Please look and see what specifics they are requesting aside from just the signature. Please advise.

## 2016-11-17 NOTE — Telephone Encounter (Signed)
[facility] faxed over forms for orders. We faxed them back.

## 2016-11-17 NOTE — Telephone Encounter (Signed)
Forms are on Dr. [name] desk to fill out.

## 2016-11-20 NOTE — Telephone Encounter (Signed)
Angela Page, Clinical Manager w/ [facility].  Patient complaining of pain a lot more. On oxy and 400mg  4x a day of gabapentin, and has been getting her mom to give her extra.  She is also taking ambien in the morning and at night b/c she is lacking sleep. (mom gives her ambien)   Pain doctor advised her she is taking the max amount for pain. She will most likely run out of pain meds before a script can be sent in again.   Rise Paganini wanted to make Dr. [name] aware of the concerns RE her pain.  Ty, -LL

## 2016-11-20 NOTE — Telephone Encounter (Signed)
FYI

## 2016-11-21 NOTE — Telephone Encounter (Signed)
LM for patient's mother but spoke with Rise Paganini (nurse).  She said she has already spoken with family regarding medication.

## 2016-11-21 NOTE — Telephone Encounter (Signed)
Advise them not to overtake Ambien. It will not be prescribed early. Dangerous.

## 2016-11-25 ENCOUNTER — Ambulatory Visit: Payer: Medicare Other | Admitting: Family Medicine

## 2016-11-25 IMAGING — DX DG CHEST 1V PORT
1 series · 1 of 1 positions shown · non-contrast
Comparison: 09/03/2014

CLINICAL DATA: Weakness, shortness of breath

EXAM:
PORTABLE CHEST 1 VIEW

[chest ap]
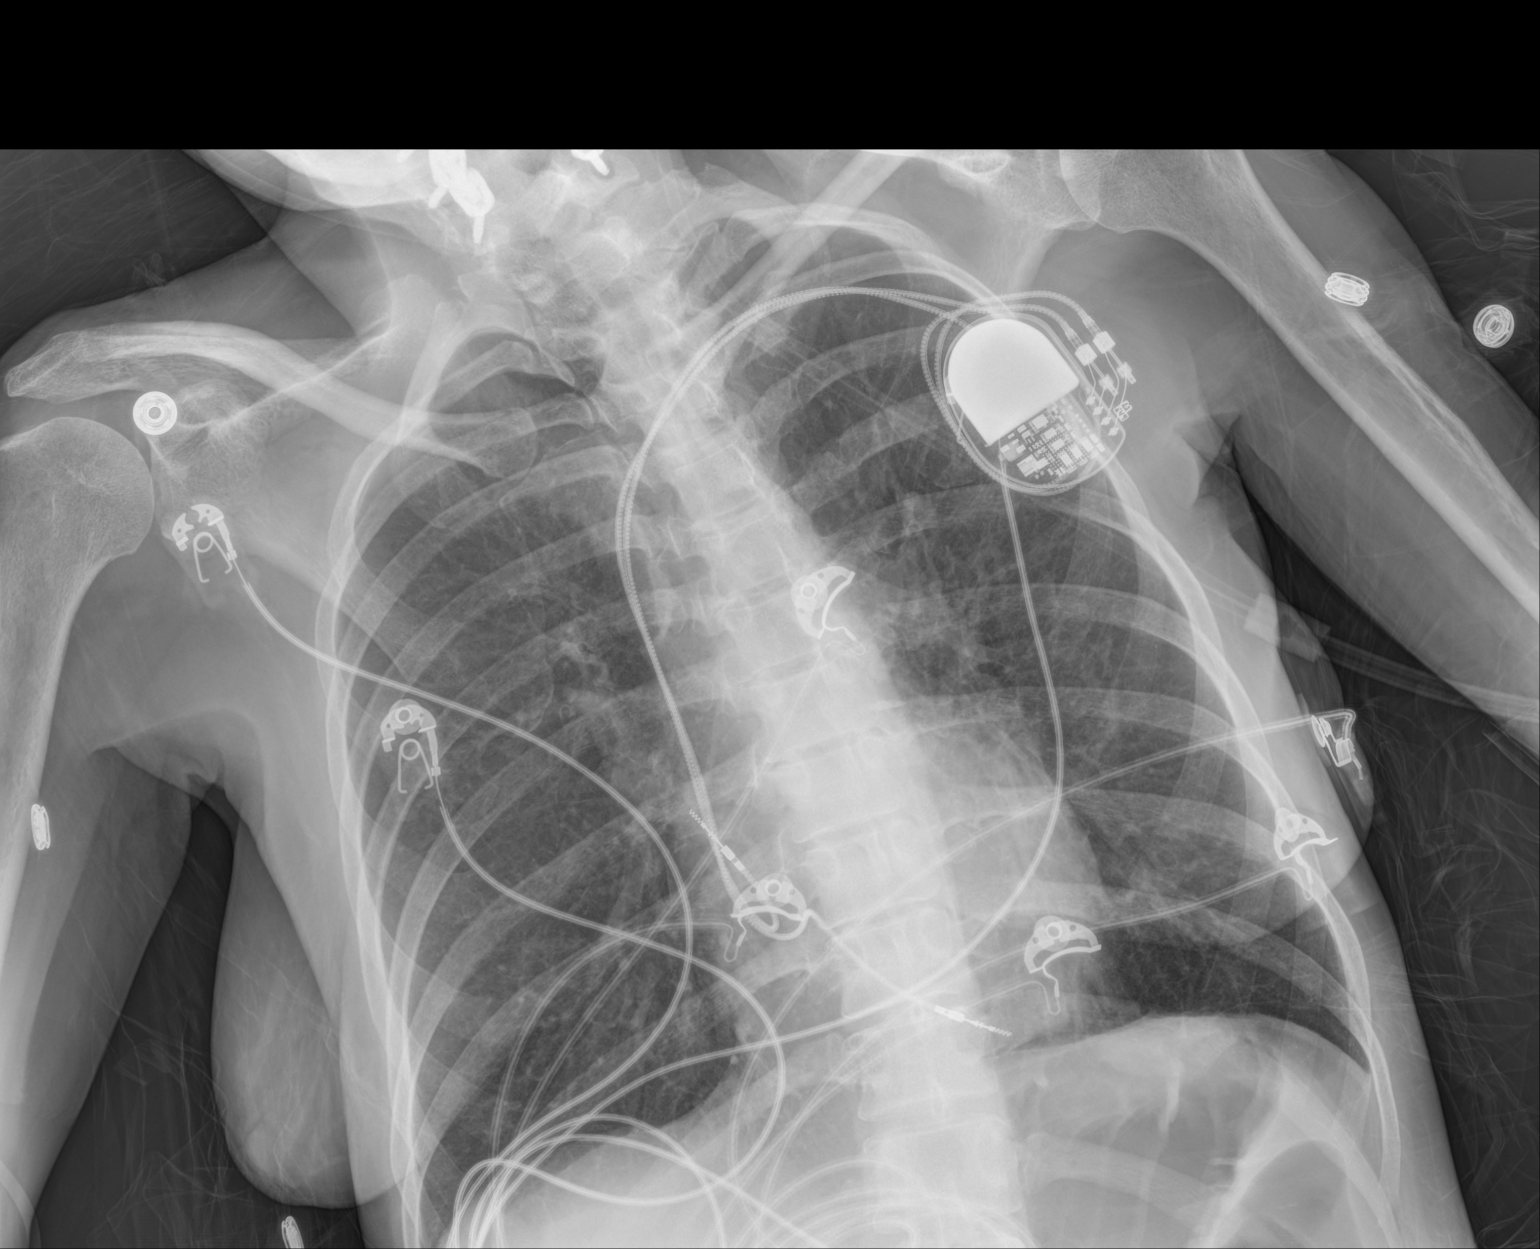

[1 of 1 positions shown; findings below may reference images not displayed]

FINDINGS: There is no focal parenchymal opacity. There is no pleural effusion
or pneumothorax. The heart and mediastinal contours are
unremarkable. There is a dual lead cardiac pacemaker.

There is evidence of prior posterior cervical fusion.
IMPRESSION: No active disease.

## 2016-11-26 ENCOUNTER — Telehealth: Payer: Self-pay | Admitting: Family Medicine

## 2016-11-26 MED ORDER — DULOXETINE HCL 30 MG PO CPEP: 30.0000 mg | ORAL_CAPSULE | Freq: Two times a day (BID) | ORAL | 2 refills | Status: DC

## 2016-11-26 NOTE — Telephone Encounter (Signed)
Yes

## 2016-11-26 NOTE — Addendum Note (Signed)
Addended by: Durwin Glaze on: 11/26/2016 02:06 PM   Modules accepted: Orders

## 2016-11-26 NOTE — Telephone Encounter (Signed)
MEDICATION: DULoxetine (CYMBALTA) 30 MG capsule   PHARMACY:  [id number] [place]  IS THIS A 90 DAY SUPPLY : no  IS PATIENT OUT OF MEDICATION: no  IF NOT; HOW MUCH IS LEFT: 5 pills left  LAST APPOINTMENT DATE: @10 /22/2018  NEXT APPOINTMENT DATE:@n /a  OTHER COMMENTS: Would be Dr. Alcario Drought first time prescribing this.   **Let patient know to contact pharmacy at the end of the day to make sure medication is ready. **  ** Please notify patient to allow 48-72 hours to process**  **Encourage patient to contact the pharmacy for refills or they can request refills through [facility]**

## 2016-11-26 NOTE — Telephone Encounter (Signed)
OK to refill

## 2016-12-01 ENCOUNTER — Encounter (HOSPITAL_BASED_OUTPATIENT_CLINIC_OR_DEPARTMENT_OTHER): Payer: Medicare Other | Attending: Internal Medicine

## 2016-12-01 DIAGNOSIS — L89154 Pressure ulcer of sacral region, stage 4: Secondary | ICD-10-CM | POA: Insufficient documentation

## 2016-12-01 DIAGNOSIS — G8253 Quadriplegia, C5-C7 complete: Secondary | ICD-10-CM | POA: Insufficient documentation

## 2016-12-01 DIAGNOSIS — L89312 Pressure ulcer of right buttock, stage 2: Secondary | ICD-10-CM | POA: Insufficient documentation

## 2016-12-02 IMAGING — DX DG CHEST 1V PORT
1 series · 1 of 1 positions shown · non-contrast
Comparison: 10/22/2014

CLINICAL DATA: History of asthma.  Smoker.  Admitted for UTI.

EXAM:
PORTABLE CHEST 1 VIEW

[chest ap]
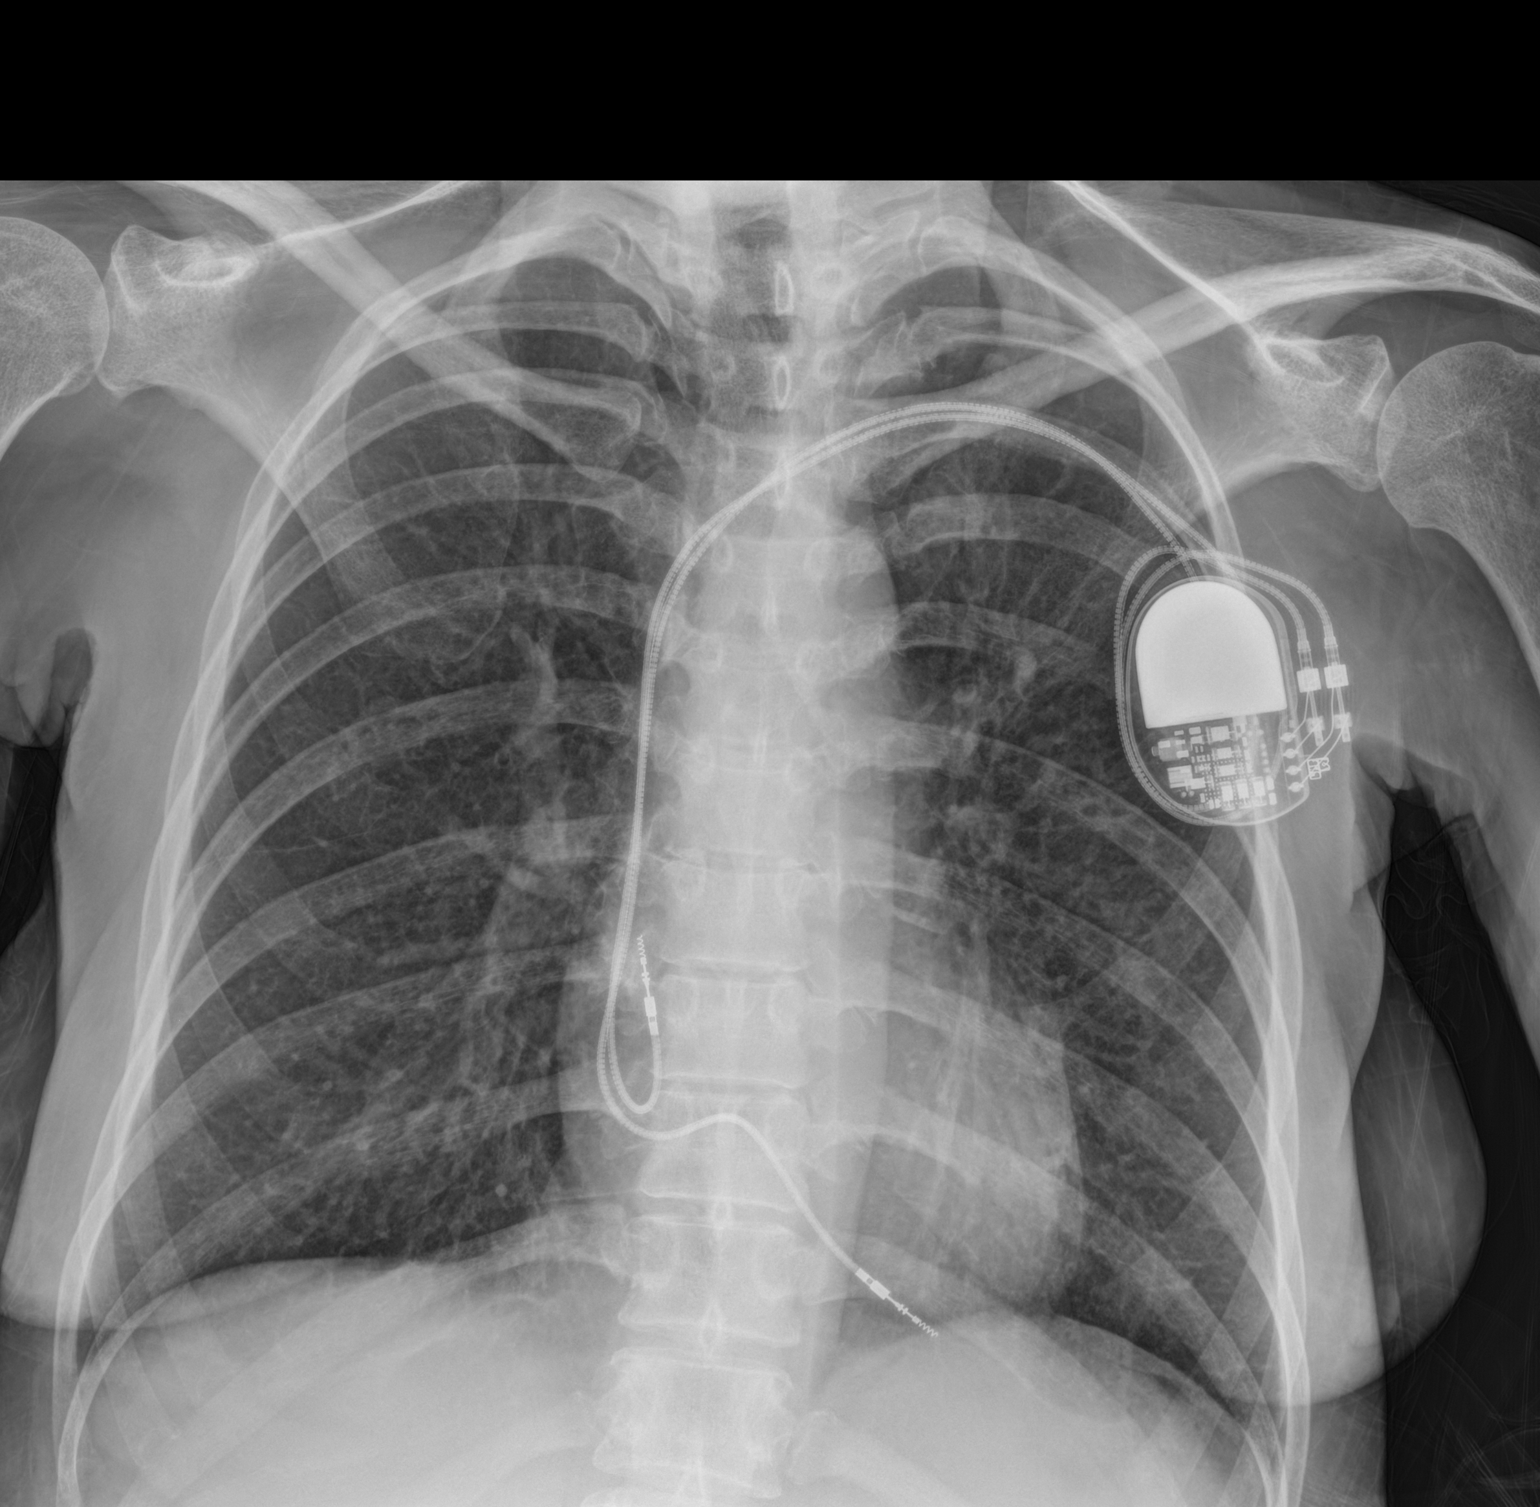

[1 of 1 positions shown; findings below may reference images not displayed]

FINDINGS: Dual lead cardiac pacemaker is unchanged.

Cardiomediastinal silhouette is normal. Mediastinal contours appear
intact.

There is no evidence of focal airspace consolidation, pleural
effusion or pneumothorax.

Osseous structures are without acute abnormality. Soft tissues are
grossly normal.
IMPRESSION: No radiographic evidence of acute cardiopulmonary abnormality.

## 2016-12-08 ENCOUNTER — Telehealth: Payer: Self-pay | Admitting: Family Medicine

## 2016-12-08 NOTE — Telephone Encounter (Signed)
Noted  

## 2016-12-08 NOTE — Telephone Encounter (Signed)
Patient Name: EMBERLEIGH REILY  DOB: 1970-12-25    Initial Comment Caller states for the last week, her mouth has been sore. Tongue is swelled. Feels thick in her mouth. She's also constipated.   Nurse Assessment  Nurse: Verlin Fester RN, [facility] Date/Time Eilene Ghazi Time): 12/08/2016 12:42:58 PM  Confirm and document reason for call. If symptomatic, describe symptoms. ---Patient states for the last week, her mouth has been sore. Tongue is swelled. Feels thick in her mouth.  Does the patient have any new or worsening symptoms? ---Yes  Will a triage be completed? ---Yes  Related visit to physician within the last 2 weeks? ---No  Does the PT have any chronic conditions? (i.e. diabetes, asthma, etc.) ---Yes  List chronic conditions. ---"quadriplegia, lung problems and she is on oxygen  Is the patient pregnant or possibly pregnant? (Ask all females between the ages of 66-55) ---No  Is this a behavioral health or substance abuse call? ---No     Guidelines    Guideline Title Affirmed Question Affirmed Notes  Tongue Swelling Pain in tongue, mouth, or tooth    Final Disposition User   Go to ED Now Verlin Fester, RN, [facility]    Referrals  [facility] - ED   Caller Disagree/Comply Comply  Caller Understands Yes  PreDisposition Call Doctor

## 2016-12-08 NOTE — Telephone Encounter (Signed)
Patient called stating that she has had a sore mouth for 1 week and a swollen tongue. She states her tongue is so swollen that it feels thick. I explained that I needed her to speak to the triage team immediately and that they would advise on if an appointment is needed or if she needs to go to the ER. Awaiting teamhealth note.

## 2016-12-08 NOTE — Telephone Encounter (Signed)
[facility] sent patient to ED.

## 2016-12-09 ENCOUNTER — Encounter (HOSPITAL_COMMUNITY): Payer: Self-pay | Admitting: Internal Medicine

## 2016-12-09 ENCOUNTER — Emergency Department (HOSPITAL_COMMUNITY)
Admission: EM | Admit: 2016-12-09 | Discharge: 2016-12-09 | Disposition: A | Discharge status: Home / Self Care | Payer: Medicare Other | Attending: Emergency Medicine | Admitting: Emergency Medicine

## 2016-12-09 DIAGNOSIS — R220 Localized swelling, mass and lump, head: Secondary | ICD-10-CM | POA: Diagnosis not present

## 2016-12-09 DIAGNOSIS — G8929 Other chronic pain: Secondary | ICD-10-CM | POA: Diagnosis not present

## 2016-12-09 DIAGNOSIS — F17210 Nicotine dependence, cigarettes, uncomplicated: Secondary | ICD-10-CM | POA: Diagnosis not present

## 2016-12-09 DIAGNOSIS — Z79899 Other long term (current) drug therapy: Secondary | ICD-10-CM | POA: Insufficient documentation

## 2016-12-09 DIAGNOSIS — G894 Chronic pain syndrome: Secondary | ICD-10-CM

## 2016-12-09 DIAGNOSIS — Z960 Presence of urogenital implants: Secondary | ICD-10-CM | POA: Diagnosis not present

## 2016-12-09 DIAGNOSIS — I509 Heart failure, unspecified: Secondary | ICD-10-CM | POA: Insufficient documentation

## 2016-12-09 DIAGNOSIS — Z931 Gastrostomy status: Secondary | ICD-10-CM | POA: Diagnosis not present

## 2016-12-09 DIAGNOSIS — Z930 Tracheostomy status: Secondary | ICD-10-CM | POA: Insufficient documentation

## 2016-12-09 DIAGNOSIS — G8250 Quadriplegia, unspecified: Secondary | ICD-10-CM

## 2016-12-09 DIAGNOSIS — Z950 Presence of cardiac pacemaker: Secondary | ICD-10-CM | POA: Diagnosis not present

## 2016-12-09 MED ORDER — HYDROMORPHONE HCL 1 MG/ML IJ SOLN
1.0000 mg | Freq: Once | INTRAMUSCULAR | Status: AC
Start: 2016-12-09 — End: 2016-12-09
  Administered 2016-12-09: 1 mg via INTRAMUSCULAR
  Filled 2016-12-09: qty 1

## 2016-12-09 MED ORDER — NYSTATIN 100000 UNIT/ML MT SUSP: 500000.0000 [IU] | Freq: Four times a day (QID) | OROMUCOSAL | 1 refills | Status: DC

## 2016-12-09 NOTE — ED Notes (Signed)
[facility] at bedside taking patient.

## 2016-12-09 NOTE — Discharge Instructions (Signed)
You will need to follow-up with your pain management doctor.   Prescription for medication for thrush.

## 2016-12-09 NOTE — ED Notes (Signed)
[facility] notified of need for transport. 
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
## 2016-12-15 ENCOUNTER — Telehealth: Payer: Self-pay | Admitting: Emergency Medicine

## 2016-12-15 ENCOUNTER — Telehealth: Payer: Self-pay | Admitting: Family Medicine

## 2016-12-15 DIAGNOSIS — Z9981 Dependence on supplemental oxygen: Secondary | ICD-10-CM

## 2016-12-15 NOTE — Telephone Encounter (Signed)
Lost to followup 

## 2016-12-15 NOTE — Telephone Encounter (Signed)
I have placed referral for pulmonary for oxygen assessment. Mother has been notified.

## 2016-12-15 NOTE — Telephone Encounter (Signed)
Patient's mother called to advise that the patient needs a referral to see Pulmonology for her breathing. Call to advise with any further questions.

## 2016-12-26 ENCOUNTER — Other Ambulatory Visit: Payer: Self-pay | Admitting: Family Medicine

## 2016-12-30 ENCOUNTER — Institutional Professional Consult (permissible substitution): Payer: Medicare Other | Admitting: Pulmonary Disease

## 2017-01-05 ENCOUNTER — Encounter (HOSPITAL_BASED_OUTPATIENT_CLINIC_OR_DEPARTMENT_OTHER): Payer: Medicare Other | Attending: Internal Medicine

## 2017-01-05 DIAGNOSIS — G8253 Quadriplegia, C5-C7 complete: Secondary | ICD-10-CM | POA: Diagnosis not present

## 2017-01-05 DIAGNOSIS — L89154 Pressure ulcer of sacral region, stage 4: Secondary | ICD-10-CM | POA: Insufficient documentation

## 2017-01-09 ENCOUNTER — Other Ambulatory Visit: Payer: Self-pay | Admitting: Family Medicine

## 2017-01-09 NOTE — Telephone Encounter (Signed)
Please advise on refill. Patient should still have one refill left at pharmacy.

## 2017-01-10 NOTE — Telephone Encounter (Signed)
Okay to refill. Change RX to once daily first. Thanks!! EW

## 2017-01-14 ENCOUNTER — Encounter: Payer: Self-pay | Admitting: Family Medicine

## 2017-01-15 ENCOUNTER — Other Ambulatory Visit: Payer: Self-pay

## 2017-01-16 ENCOUNTER — Ambulatory Visit: Payer: Self-pay | Admitting: Neurology

## 2017-01-22 ENCOUNTER — Other Ambulatory Visit: Payer: Self-pay | Admitting: Family Medicine

## 2017-01-22 NOTE — Telephone Encounter (Signed)
See refill request.

## 2017-01-22 NOTE — Telephone Encounter (Signed)
Copied from [place] [id number]. Topic: Quick Communication - Rx Refill/Question >> Jan 22, 2017 12:27 PM Patrice Paradise wrote: Has the patient contacted their pharmacy? Yes.    Nutritional Supplements (FEEDING SUPPLEMENT, OSMOLITE 1.2 CAL,) LIQD  (PATIENT IS REQUESTING THAT Dr. [name] WRITTEN A RX FOR THIS AND FAX IT TO [facility] @ [phone]. OVER 10 HRS, EVERYDAY). They are threatening to stop her supply. Because they feel like she been noncompliance.  (Agent: If no, request that the patient contact the pharmacy for the refill.)  Preferred Pharmacy (with phone number or street name):  [facility] Fax# [phone]  Agent: Please be advised that RX refills may take up to 3 business days. We ask that you follow-up with your pharmacy.

## 2017-01-22 NOTE — Telephone Encounter (Signed)
Please advise 

## 2017-01-22 NOTE — Telephone Encounter (Signed)
Rx request 

## 2017-01-23 NOTE — Telephone Encounter (Signed)
I don't understand the request. Didn't we ask for the feeding supplements to be evaluated by [facility] or RD after the last visit? I'm okay with the request for now.

## 2017-01-23 NOTE — Telephone Encounter (Signed)
Spoke with patient and she stated that she has only been using 4 cans of formula a day. I will call home health back to discuss with them.

## 2017-01-23 NOTE — Telephone Encounter (Signed)
Spoke with Morey Hummingbird at [facility]. She looked back through the patient chart to find out how much Osmolite the patient is using. She stated that every month the patient has extra formula and needs extra bags to add up to what formula she has left. She is supposed to be using 6 a day. The last prescription that they had on file was from Dr. Sherrell Puller her previous provider. They will need a new script for what she is actually using or Medicare may stop paying is what I understood.   I have left message for the patient to return call to the office to find out what amount of formula that she is actually using. Once she calls back with this information I will call the Home Health dietician back to find out how much the prescription should be for.

## 2017-01-27 ENCOUNTER — Other Ambulatory Visit: Payer: Self-pay | Admitting: Family Medicine

## 2017-01-27 ENCOUNTER — Telehealth: Payer: Self-pay | Admitting: Family Medicine

## 2017-01-27 NOTE — Telephone Encounter (Signed)
Please advise on refill.

## 2017-01-27 NOTE — Telephone Encounter (Signed)
Copied from [place] [id number]. Topic: Quick Communication - Rx Refill/Question >> Jan 27, 2017 12:14 PM Arletha Grippe wrote: Has the patient contacted their pharmacy? Yes.     (Agent: If no, request that the patient contact the pharmacy for the refill.)   Preferred Pharmacy (with phone number or street name): mom called - requesting refill on DULoxetine (CYMBALTA) 30 MG capsule. Pt takes 2 po qd.  [facility] [address] [phone] if any questions.    Agent: Please be advised that RX refills may take up to 3 business days. We ask that you follow-up with your pharmacy.

## 2017-01-27 NOTE — Telephone Encounter (Signed)
Rx needs to be corrected- dosing on refill is incorrect. Then patient needs refill.

## 2017-01-27 NOTE — Telephone Encounter (Signed)
See note

## 2017-01-27 NOTE — Telephone Encounter (Signed)
Okay refill at 60 mg dose.

## 2017-01-27 NOTE — Telephone Encounter (Signed)
Please advise 

## 2017-01-28 ENCOUNTER — Telehealth: Payer: Self-pay | Admitting: Family Medicine

## 2017-01-28 MED ORDER — DULOXETINE HCL 60 MG PO CPEP: 60.0000 mg | ORAL_CAPSULE | Freq: Every day | ORAL | 3 refills | Status: DC

## 2017-01-28 MED ORDER — OSMOLITE 1.2 CAL PO LIQD: 1000.0000 mL | ORAL | 12 refills | Status: DC

## 2017-01-28 NOTE — Telephone Encounter (Signed)
Please contact the number provided to advise on the note below.

## 2017-01-28 NOTE — Telephone Encounter (Signed)
Notified patient mom that prescription has been sent to the pharmacy and the change that was made.

## 2017-01-28 NOTE — Telephone Encounter (Signed)
Copied from [place] [id number]. Topic: Quick Communication - See Telephone Encounter >> Jan 28, 2017  4:21 PM Arletha Grippe wrote: CRM for notification. See Telephone encounter for:   01/28/17.[facility] called - they received orders for feeding comp instructions - they need to have rate time hours.  Cb# is [phone]

## 2017-01-28 NOTE — Telephone Encounter (Signed)
New prescription sent to the pharmacy.

## 2017-01-28 NOTE — Telephone Encounter (Signed)
Faxed new prescription to [facility].

## 2017-01-29 NOTE — Telephone Encounter (Signed)
Spoke with Mindy at [facility] in the nutrition department. I explained that the patient is only using four cans a day of the supplement for feeding tube. She is going to keep everything the same that they was doing and just change the order to show the patient is only using four cans of supplement a day.

## 2017-02-02 ENCOUNTER — Encounter (HOSPITAL_BASED_OUTPATIENT_CLINIC_OR_DEPARTMENT_OTHER): Payer: Medicare Other | Attending: Internal Medicine

## 2017-02-02 ENCOUNTER — Other Ambulatory Visit (HOSPITAL_COMMUNITY)
Admission: RE | Admit: 2017-02-02 | Discharge: 2017-02-02 | Disposition: A | Discharge status: Home / Self Care | Payer: Medicare Other | Source: Other Acute Inpatient Hospital | Attending: Internal Medicine | Admitting: Internal Medicine

## 2017-02-02 DIAGNOSIS — L89154 Pressure ulcer of sacral region, stage 4: Secondary | ICD-10-CM | POA: Diagnosis not present

## 2017-02-02 DIAGNOSIS — G8253 Quadriplegia, C5-C7 complete: Secondary | ICD-10-CM | POA: Insufficient documentation

## 2017-02-06 ENCOUNTER — Institutional Professional Consult (permissible substitution): Payer: Medicare Other | Admitting: Pulmonary Disease

## 2017-02-10 ENCOUNTER — Ambulatory Visit (INDEPENDENT_AMBULATORY_CARE_PROVIDER_SITE_OTHER): Payer: Medicare Other | Admitting: Pulmonary Disease

## 2017-02-10 VITALS — BP 130/72 | HR 107 | Ht 62.0 in

## 2017-02-10 DIAGNOSIS — I272 Pulmonary hypertension, unspecified: Secondary | ICD-10-CM | POA: Diagnosis not present

## 2017-02-10 LAB — AEROBIC/ANAEROBIC CULTURE (SURGICAL/DEEP WOUND)

## 2017-02-10 LAB — AEROBIC/ANAEROBIC CULTURE W GRAM STAIN (SURGICAL/DEEP WOUND)

## 2017-02-10 NOTE — Patient Instructions (Signed)
We will evaluate your oxygen needs and put in an order to continue your home oxygen Please follow-up with Marni Griffon in the tracheostomy clinic I will you back in clinic in 1 year for recertification for oxygen Please work on smoking cessation

## 2017-02-10 NOTE — Progress Notes (Signed)
Angela Page    [id number]    July 19, 1970  Primary Care Physician:Wallace, Danae Chen, DO  Referring Physician: Briscoe Deutscher, [address] [place], [place] [address]  Chief complaint: Needs qualification for supplemental oxygen  HPI: 47 year old with history of quadriplegia secondary to cervical and thoracic spinal cord injury, chronic tracheostomy, recurrent aspiration pneumonia.  She follows at the trach clinic and has a #6 non-cuffed Shiley.  No plans for decannulation.  She has a trach cap in place which is removed from time to time for suctioning. Sent to the pulmonary clinic as she needs qualification to get her oxygen ordered.  She has chronic cough with sputum production.  Continues to smoke a pack a day.  Denies any fevers, chills, hemoptysis.  Outpatient Encounter Medications as of 02/10/2017  Medication Sig  . acetaminophen (TYLENOL) 500 MG tablet Take 1,000 mg by mouth every 6 (six) hours as needed for mild pain, moderate pain, fever or headache.   . albuterol (PROVENTIL HFA;VENTOLIN HFA) 108 (90 Base) MCG/ACT inhaler Inhale 1-2 puffs into the lungs every 6 (six) hours as needed for wheezing or shortness of breath.  Marland Kitchen albuterol (PROVENTIL) (2.5 MG/3ML) 0.083% nebulizer solution Take 2.5 mg by nebulization every 6 (six) hours as needed for wheezing or shortness of breath.  Marland Kitchen alum & mag hydroxide-simeth (MAALOX/MYLANTA) 200-200-20 MG/5ML suspension Take 30 mLs by mouth every 6 (six) hours as needed for indigestion or heartburn.  . baclofen (LIORESAL) 20 MG tablet Take 20 mg by mouth 3 (three) times daily.  . bisacodyl (DULCOLAX) 5 MG EC tablet Take 5 mg by mouth daily as needed for moderate constipation.  . collagenase (SANTYL) ointment Apply 1 application topically daily.  . DULoxetine (CYMBALTA) 60 MG capsule Take 1 capsule (60 mg total) by mouth daily.  . furosemide (LASIX) 40 MG tablet Take 1 tablet (40 mg total) by mouth daily.  Marland Kitchen gabapentin (NEURONTIN)  400 MG capsule Take 1 capsule (400 mg total) by mouth 4 (four) times daily.  Marland Kitchen ibuprofen (ADVIL,MOTRIN) 200 MG tablet Place 2 tablets (400 mg total) into feeding tube every 6 (six) hours as needed for moderate pain. (Patient taking differently: Place 400-600 mg into feeding tube every 6 (six) hours as needed for fever, headache, mild pain, moderate pain or cramping. )  . lidocaine (LIDODERM) 5 % Place 1 patch onto the skin 2 (two) times daily as needed (pain).   . magnesium hydroxide (MILK OF MAGNESIA) 400 MG/5ML suspension Take 5 mLs by mouth daily as needed for mild constipation.  . Melatonin 5 MG TABS Take 1 tablet (5 mg total) by mouth at bedtime.  . midodrine (PROAMATINE) 10 MG tablet PLACE 1 TABLET (10 MG TOTAL) INTO FEEDING TUBE 3 (THREE) TIMES DAILY WITH MEALS.  Marland Kitchen Nutritional Supplements (FEEDING SUPPLEMENT, OSMOLITE 1.2 CAL,) LIQD Place 100 mLs into feeding tube See admin instructions. Given for 10 hours at a time.  Given between 2300 - 0900 the next day.  . Nutritional Supplements (FEEDING SUPPLEMENT, OSMOLITE 1.2 CAL,) LIQD Place 1,000 mLs into feeding tube continuous.  . Nutritional Supplements (PROMOD) LIQD Take 30 mLs by mouth 2 (two) times daily.  Marland Kitchen nystatin (MYCOSTATIN) 100000 UNIT/ML suspension Take 5 mLs (500,000 Units total) by mouth 4 (four) times daily.  . ondansetron (ZOFRAN) 4 MG tablet Take 1 tablet (4 mg total) by mouth every 8 (eight) hours as needed for nausea or vomiting.  Marland Kitchen oxybutynin (DITROPAN) 5 MG tablet Take 5 mg  by mouth every 8 (eight) hours as needed for bladder spasms.  . Oxycodone HCl 10 MG TABS Take 10 mg by mouth 4 (four) times daily as needed (for pain).   . OXYGEN Inhale 3 L into the lungs continuous.  . polyethylene glycol powder (GLYCOLAX/MIRALAX) powder Take 17 g by mouth daily.  . Skin Protectants, Misc. (BALMEX SKIN PROTECTANT EX) Apply 1 application topically 3 (three) times daily.  Marland Kitchen tiotropium (SPIRIVA HANDIHALER) 18 MCG inhalation capsule Place 1  capsule (18 mcg total) into inhaler and inhale daily.  Marland Kitchen tiZANidine (ZANAFLEX) 4 MG tablet TAKE 1 TABLET (4 MG TOTAL) BY MOUTH 3 (THREE) TIMES DAILY.  Marland Kitchen Water For Irrigation, Sterile (FREE WATER) SOLN Place 100 mLs into feeding tube every 8 (eight) hours.  Marland Kitchen zinc oxide (BALMEX) 11.3 % CREA cream Apply 1 application topically 3 (three) times daily as needed (for skin redness/irritation).  Marland Kitchen zolpidem (AMBIEN) 5 MG tablet Take 2 tablets (10 mg total) by mouth at bedtime as needed for sleep.  . [DISCONTINUED] DULoxetine (CYMBALTA) 30 MG capsule Take 1 capsule (30 mg total) by mouth daily.   No facility-administered encounter medications on file as of 02/10/2017.     Allergies as of 02/10/2017 - Review Complete 02/10/2017  Allergen Reaction Noted  . Erythromycin Nausea And Vomiting 09/12/2010  . Nitrofurantoin monohyd macro Nausea And Vomiting 01/05/2011  . Penicillins Hives 09/12/2010    Past Medical History:  Diagnosis Date  . Anasarca 06/10/2016  . Asthma   . Cocaine use 10/08/2013  . GERD (gastroesophageal reflux disease)   . HCAP (healthcare-associated pneumonia) 06/09/2016  . Overdose of opiate or related narcotic ([place]) 09/02/2014  . Pacemaker   . Pleurisy   . Polysubstance dependence including opioid type drug, episodic abuse ([place]) 10/27/2013  . Protein calorie malnutrition ([place]) 10/31/2015  . Quadriparesis ([place])   . Quadriplegia, C5-C7 incomplete ([place]) 10/08/2013  . Stage IV pressure ulcer of sacral region ([place]) 10/31/2015    Past Surgical History:  Procedure Laterality Date  . APPENDECTOMY    . BACK SURGERY  10/08/2013   fusion of spine, cervical region  . CARDIAC SURGERY    . GASTROSTOMY  11/23/2015  . I&D EXTREMITY  10/28/2011   Procedure: IRRIGATION AND DEBRIDEMENT EXTREMITY;  Surgeon: Tennis Must, MD;  Location: [facility];  Service: Orthopedics;  Laterality: Right;  Irrigation and debridement right middle finger  . INSERTION OF SUPRAPUBIC CATHETER N/A  07/28/2016   Procedure: INSERTION OF SUPRAPUBIC CATHETER;  Surgeon: Franchot Gallo, MD;  Location: [facility];  Service: Urology;  Laterality: N/A;  . IR GENERIC HISTORICAL  11/23/2015   IR GASTROSTOMY TUBE MOD SED 11/23/2015 [facility]  . TRACHEOSTOMY      Family History  Problem Relation Age of Onset  . Hypertension Maternal Uncle   . Kidney failure Maternal Uncle   . Colon cancer Mother   . Lung cancer Father   . Hypertension Brother   . Kidney failure Maternal Aunt   . Kidney failure Maternal Uncle     Social History   Socioeconomic History  . Marital status: Divorced    Spouse name: Not on file  . Number of children: Not on file  . Years of education: Not on file  . Highest education level: Not on file  Social Needs  . Financial resource strain: Not on file  . Food insecurity - worry: Not on file  . Food insecurity - inability: Not on file  . Transportation needs - medical:  Not on file  . Transportation needs - non-medical: Not on file  Occupational History  . Not on file  Tobacco Use  . Smoking status: Current Every Day Smoker    Packs/day: 1.00    Years: 25.00    Pack years: 25.00    Types: Cigarettes  . Smokeless tobacco: Never Used  Substance and Sexual Activity  . Alcohol use: Yes    Comment: 2 x weekly  . Drug use: No  . Sexual activity: No    Birth control/protection: None  Other Topics Concern  . Not on file  Social History Narrative  . Not on file    Review of systems: Review of Systems  Constitutional: Negative for fever and chills.  HENT: Negative.   Eyes: Negative for blurred vision.  Respiratory: as per HPI  Cardiovascular: Negative for chest pain and palpitations.  Gastrointestinal: Negative for vomiting, diarrhea, blood per rectum. Genitourinary: Negative for dysuria, urgency, frequency and hematuria.  Musculoskeletal: Negative for myalgias, back pain and joint pain.  Skin: Negative for itching and rash.  Neurological: Negative for  dizziness, tremors, focal weakness, seizures and loss of consciousness.  Endo/Heme/Allergies: Negative for environmental allergies.  Psychiatric/Behavioral: Negative for depression, suicidal ideas and hallucinations.  All other systems reviewed and are negative.  Physical Exam: Blood pressure 130/72, pulse (!) 107, height 5\' 2"  (1.575 m), last menstrual period 05/27/2016, SpO2 90 %. Gen:      No acute distress HEENT:  EOMI, sclera anicteric Neck:     No masses; no thyromegaly, trach site clean, dry Lungs:    Scattered rhonchi CV:         Regular rate and rhythm; no murmurs Abd:      + bowel sounds; soft, non-tender; no palpable masses, no distension Ext:    No edema; adequate peripheral perfusion Skin:      Warm and dry; no rash Neuro: alert and oriented x 3 Psych: normal mood and affect  Data Reviewed: CT chest 06/09/16-pulmonary embolism, consolidation of the right lower lobe, right upper lobe, right middle lobe.  I space opacity in the left lower lobe, trace right effusion.  Subcarinal lymphadenopathy. Chest x-ray 10/17/16-mild interstitial opacity, tracheostomy in stable position. I have reviewed the images personally  Assessment:  Chronic trach, quadriplegia, history of recurrent aspiration Regular trach change at the tracheostomy clinic.  No plans on decannulation Continue trach care, chest PT  Supplemental oxygen in ordered through DME.  Follow-up in clinic annually to requalify for oxygen. Smoking cessation encouraged  Plan/Recommendations: -Continue supplemental oxygen -Trach care, chest PT  Marshell Garfinkel MD [facility] Pager [phone] 02/10/2017, 11:40 AM  CC: Briscoe Deutscher, DO

## 2017-02-11 ENCOUNTER — Encounter: Payer: Self-pay | Admitting: Pulmonary Disease

## 2017-02-23 ENCOUNTER — Encounter (HOSPITAL_BASED_OUTPATIENT_CLINIC_OR_DEPARTMENT_OTHER): Payer: Medicare Other | Attending: Internal Medicine

## 2017-02-24 ENCOUNTER — Other Ambulatory Visit: Payer: Self-pay | Admitting: Family Medicine

## 2017-02-25 ENCOUNTER — Ambulatory Visit (HOSPITAL_COMMUNITY)
Admission: RE | Admit: 2017-02-25 | Discharge: 2017-02-25 | Disposition: A | Discharge status: Home / Self Care | Payer: Medicare Other | Source: Ambulatory Visit | Attending: Acute Care | Admitting: Acute Care

## 2017-02-25 DIAGNOSIS — Z930 Tracheostomy status: Secondary | ICD-10-CM

## 2017-02-25 DIAGNOSIS — J449 Chronic obstructive pulmonary disease, unspecified: Secondary | ICD-10-CM | POA: Diagnosis not present

## 2017-02-25 DIAGNOSIS — G8250 Quadriplegia, unspecified: Secondary | ICD-10-CM | POA: Insufficient documentation

## 2017-02-25 DIAGNOSIS — Z4682 Encounter for fitting and adjustment of non-vascular catheter: Secondary | ICD-10-CM | POA: Insufficient documentation

## 2017-02-25 NOTE — Progress Notes (Signed)
Tracheostomy Procedure Note  Nevena Rozenberg [id number] [birth year]  Pre Procedure Tracheostomy Information  Trach Brand: Shiley Size: 6.0 Style: Uncuffed Secured by: Velcro   Procedure: trach change    Post Procedure Tracheostomy Information  Trach Brand: Shiley Size: 6.0 Style: Uncuffed Secured by: Velcro   Post Procedure Evaluation:  ETCO2 positive color change from yellow to purple : Yes.   Vital signs:blood pressure 88/62, pulse 80, respirations 20 and pulse oximetry 100 % Patients current condition: stable Complications: No apparent complications Trach site exam: clean, dry Wound care done: dry and 4 x 4 gauze Patient did tolerate procedure well.   Education: none  Prescription needs: none    Additional needs: Will plan to use bronchoscope with next trach change to evaluate airway.

## 2017-02-25 NOTE — Progress Notes (Signed)
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  She presents today for routine tracheostomy change, she is on every 6 months schedule as her home health care team changes the trach on the 68-month mark  ROS General: No fevers chills headaches HEENT: No sore throat, change in voice quality, nasal congestion pulmonary: No worsening shortness of breath did have some increased chest congestion but feels this improved with Mucinex no purulent mucus no wheezing cardiac: No chest pain palpitations orthopnea abdomen: No nausea vomiting diarrhea GU normal neuro normal  Vital signs: Heart rate 80 blood pressure 88/62 respirations 20 saturations 100% on  Exam: General: 47 year old white female resting comfortably in a reclining wheelchair no acute distress HEENT: Some new facial adiposity, this is new since last visit.  Otherwise normocephalic atraumatic she has a #6 cuffless tracheostomy in place.  The trach stoma is unremarkable Pulmonary: Clear to auscultation no accessory use Abdomen: Soft nontender Cardiac: Regular rate and rhythm Extremities: Dependent edema brisk cap Neuro: Awake oriented quadriplegic  Trach change/procedure #6 cuffless tracheostomy was removed, this is following premedication with inhaled lidocaine and preparation of site with lidocaine viscous jelly.  The new #6 Shiley was placed.  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  I did note some difficulty advancing the tracheostomy at this time which I have not had before.  When she arrived today the tracheostomy was not fully advanced, and apparently she does not tolerate having the tracheostomy fully advanced due to a cough.  It feels as though there may be  a tissue ledge within the trachea which may be resulting in 1) the tracheostomy not quite being able to advance fully and 2) or symptoms of airway obstruction/cough when the trach is advanced  Plan [name] 3 months for routine trach change I will eval via bronch on that visit. I want to see if there is airway narrowing distal to the trach end. May need to downsize to # 4 which would be shorter and hopefully avoid more irritation of the area if it is present.      Visit time: 35 minutes.   Erick Colace ACNP-BC [place]

## 2017-03-02 DIAGNOSIS — L89322 Pressure ulcer of left buttock, stage 2: Secondary | ICD-10-CM | POA: Insufficient documentation

## 2017-03-02 DIAGNOSIS — B359 Dermatophytosis, unspecified: Secondary | ICD-10-CM | POA: Insufficient documentation

## 2017-03-02 DIAGNOSIS — L89154 Pressure ulcer of sacral region, stage 4: Secondary | ICD-10-CM | POA: Diagnosis not present

## 2017-03-02 DIAGNOSIS — G8253 Quadriplegia, C5-C7 complete: Secondary | ICD-10-CM | POA: Insufficient documentation

## 2017-03-06 ENCOUNTER — Other Ambulatory Visit: Payer: Self-pay | Admitting: Family Medicine

## 2017-03-06 IMAGING — DX DG CHEST 1V PORT
1 series · 1 of 1 positions shown · non-contrast
Comparison: 10/29/2014

CLINICAL DATA: Fever, chills, and cough for 3 days.  Asthma.

EXAM:
PORTABLE CHEST 1 VIEW

[chest ap]
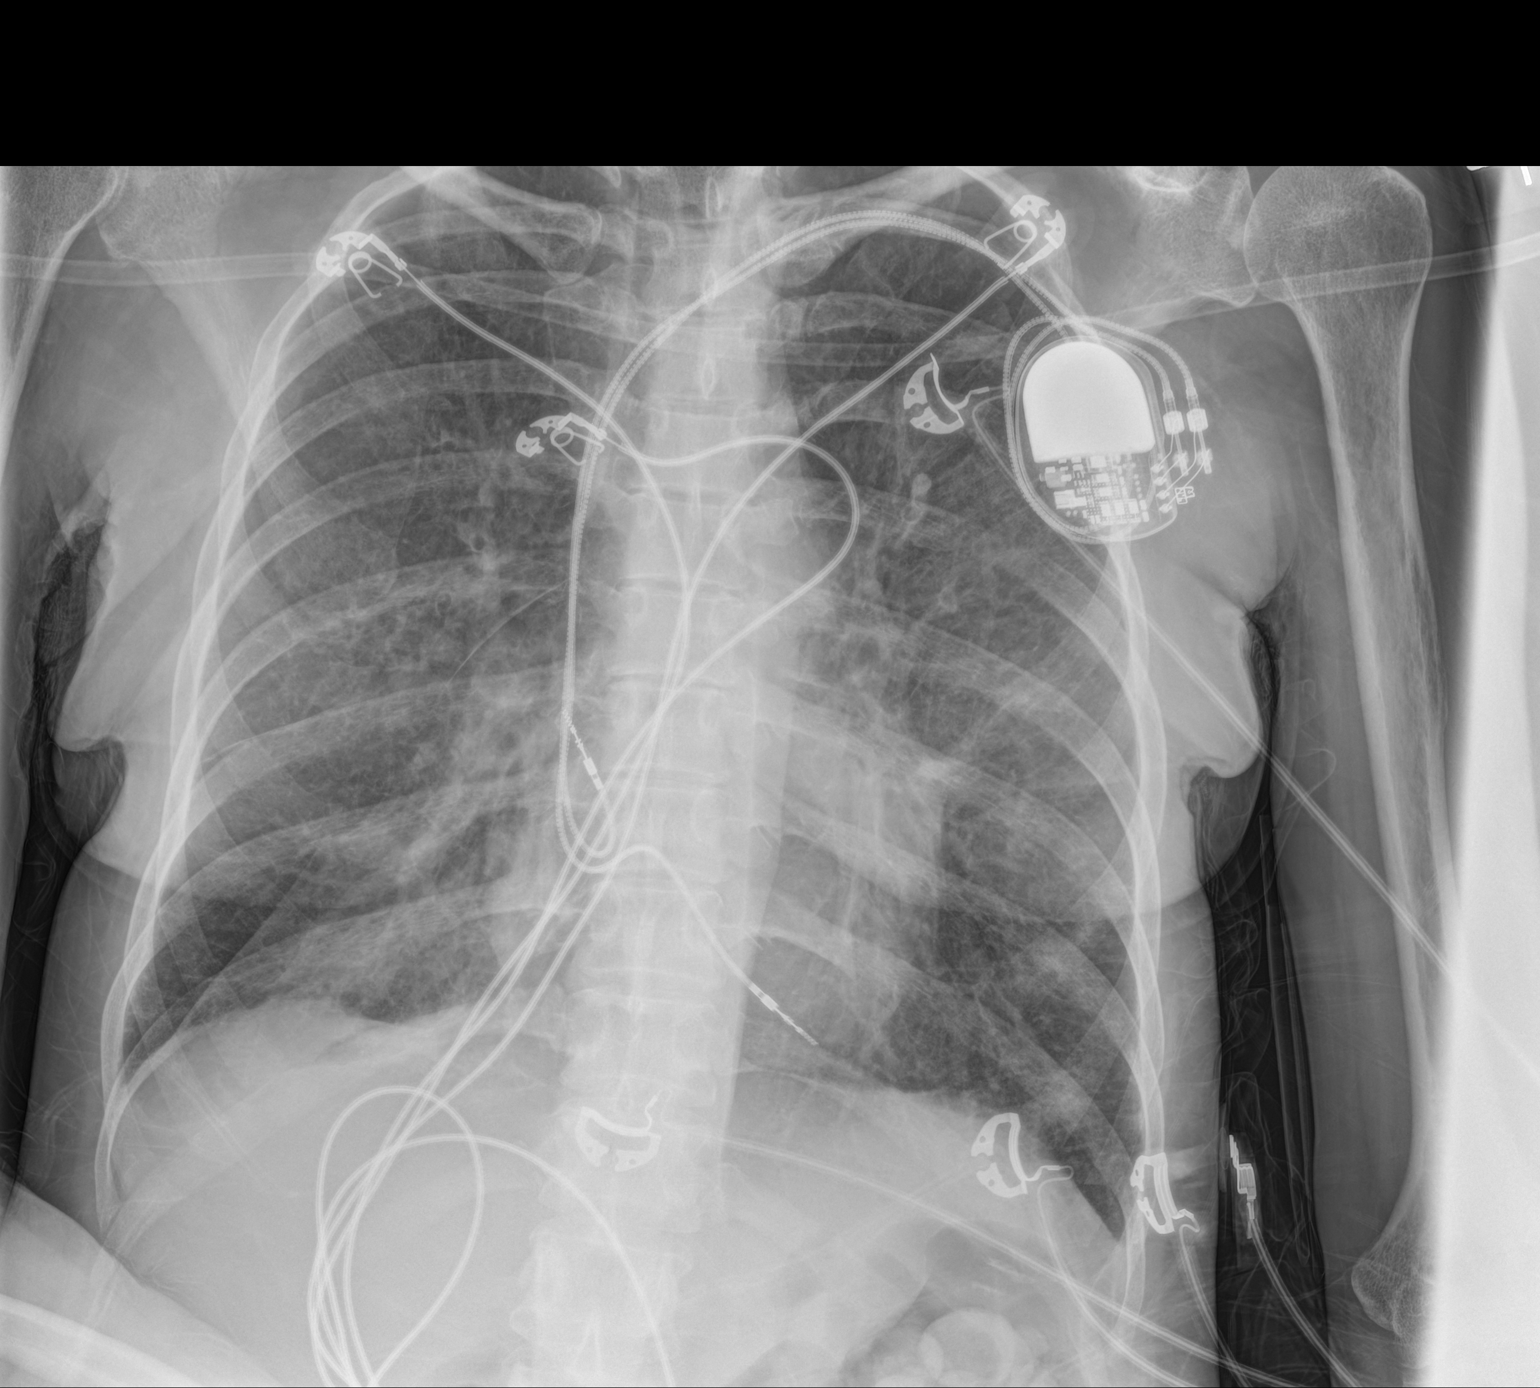

[1 of 1 positions shown; findings below may reference images not displayed]

FINDINGS: Dual lead transvenous pacemaker remains in appropriate position.
Heart size is normal.

New asymmetric airspace opacity is seen in right lower lung,
suspicious for pneumonia. Left lung is clear. No evidence of
pneumothorax or pleural effusion.
IMPRESSION: New right lower lung airspace disease, suspicious for pneumonia.
Followup PA and lateral chest X-ray is recommended in 3-4 weeks
following trial of antibiotic therapy to ensure resolution.

## 2017-03-09 ENCOUNTER — Other Ambulatory Visit: Payer: Self-pay

## 2017-03-09 ENCOUNTER — Inpatient Hospital Stay (HOSPITAL_COMMUNITY)
Admission: EM | Admit: 2017-03-09 | Discharge: 2017-03-12 | DRG: 853 | Disposition: A | Discharge status: Home / Self Care | Payer: Medicare Other | Attending: Family Medicine | Admitting: Family Medicine

## 2017-03-09 ENCOUNTER — Encounter (HOSPITAL_COMMUNITY)
Admission: EM | Disposition: A | Discharge status: Home / Self Care | Payer: Self-pay | Source: Home / Self Care | Attending: Family Medicine

## 2017-03-09 ENCOUNTER — Encounter (HOSPITAL_COMMUNITY): Payer: Self-pay | Admitting: Emergency Medicine

## 2017-03-09 ENCOUNTER — Emergency Department (HOSPITAL_COMMUNITY): Payer: Medicare Other

## 2017-03-09 ENCOUNTER — Telehealth: Payer: Self-pay | Admitting: Family Medicine

## 2017-03-09 ENCOUNTER — Inpatient Hospital Stay (HOSPITAL_COMMUNITY): Payer: Medicare Other

## 2017-03-09 ENCOUNTER — Other Ambulatory Visit: Payer: Self-pay | Admitting: Urology

## 2017-03-09 ENCOUNTER — Inpatient Hospital Stay (HOSPITAL_COMMUNITY): Payer: Medicare Other | Admitting: Anesthesiology

## 2017-03-09 DIAGNOSIS — N319 Neuromuscular dysfunction of bladder, unspecified: Secondary | ICD-10-CM | POA: Diagnosis present

## 2017-03-09 DIAGNOSIS — T83511A Infection and inflammatory reaction due to indwelling urethral catheter, initial encounter: Secondary | ICD-10-CM | POA: Diagnosis not present

## 2017-03-09 DIAGNOSIS — L89304 Pressure ulcer of unspecified buttock, stage 4: Secondary | ICD-10-CM | POA: Diagnosis present

## 2017-03-09 DIAGNOSIS — S14109S Unspecified injury at unspecified level of cervical spinal cord, sequela: Secondary | ICD-10-CM | POA: Diagnosis not present

## 2017-03-09 DIAGNOSIS — Z87442 Personal history of urinary calculi: Secondary | ICD-10-CM | POA: Diagnosis not present

## 2017-03-09 DIAGNOSIS — Z981 Arthrodesis status: Secondary | ICD-10-CM

## 2017-03-09 DIAGNOSIS — L89154 Pressure ulcer of sacral region, stage 4: Secondary | ICD-10-CM | POA: Diagnosis present

## 2017-03-09 DIAGNOSIS — G8254 Quadriplegia, C5-C7 incomplete: Secondary | ICD-10-CM | POA: Diagnosis present

## 2017-03-09 DIAGNOSIS — L98429 Non-pressure chronic ulcer of back with unspecified severity: Secondary | ICD-10-CM | POA: Diagnosis present

## 2017-03-09 DIAGNOSIS — K5900 Constipation, unspecified: Secondary | ICD-10-CM

## 2017-03-09 DIAGNOSIS — Z950 Presence of cardiac pacemaker: Secondary | ICD-10-CM

## 2017-03-09 DIAGNOSIS — Z800 Family history of malignant neoplasm of digestive organs: Secondary | ICD-10-CM

## 2017-03-09 DIAGNOSIS — L8944 Pressure ulcer of contiguous site of back, buttock and hip, stage 4: Secondary | ICD-10-CM | POA: Diagnosis present

## 2017-03-09 DIAGNOSIS — I9589 Other hypotension: Secondary | ICD-10-CM | POA: Diagnosis present

## 2017-03-09 DIAGNOSIS — A419 Sepsis, unspecified organism: Secondary | ICD-10-CM | POA: Diagnosis present

## 2017-03-09 DIAGNOSIS — G894 Chronic pain syndrome: Secondary | ICD-10-CM

## 2017-03-09 DIAGNOSIS — K219 Gastro-esophageal reflux disease without esophagitis: Secondary | ICD-10-CM | POA: Diagnosis present

## 2017-03-09 DIAGNOSIS — G8929 Other chronic pain: Secondary | ICD-10-CM | POA: Diagnosis present

## 2017-03-09 DIAGNOSIS — Z881 Allergy status to other antibiotic agents status: Secondary | ICD-10-CM

## 2017-03-09 DIAGNOSIS — K5903 Drug induced constipation: Secondary | ICD-10-CM | POA: Diagnosis present

## 2017-03-09 DIAGNOSIS — E876 Hypokalemia: Secondary | ICD-10-CM | POA: Diagnosis present

## 2017-03-09 DIAGNOSIS — Z930 Tracheostomy status: Secondary | ICD-10-CM | POA: Diagnosis not present

## 2017-03-09 DIAGNOSIS — F17200 Nicotine dependence, unspecified, uncomplicated: Secondary | ICD-10-CM | POA: Diagnosis present

## 2017-03-09 DIAGNOSIS — J440 Chronic obstructive pulmonary disease with acute lower respiratory infection: Secondary | ICD-10-CM | POA: Diagnosis present

## 2017-03-09 DIAGNOSIS — L89204 Pressure ulcer of unspecified hip, stage 4: Secondary | ICD-10-CM | POA: Diagnosis present

## 2017-03-09 DIAGNOSIS — Z978 Presence of other specified devices: Secondary | ICD-10-CM

## 2017-03-09 DIAGNOSIS — Z87440 Personal history of urinary (tract) infections: Secondary | ICD-10-CM | POA: Diagnosis not present

## 2017-03-09 DIAGNOSIS — Z8249 Family history of ischemic heart disease and other diseases of the circulatory system: Secondary | ICD-10-CM | POA: Diagnosis not present

## 2017-03-09 DIAGNOSIS — J181 Lobar pneumonia, unspecified organism: Secondary | ICD-10-CM | POA: Diagnosis present

## 2017-03-09 DIAGNOSIS — R441 Visual hallucinations: Secondary | ICD-10-CM | POA: Diagnosis present

## 2017-03-09 DIAGNOSIS — N201 Calculus of ureter: Secondary | ICD-10-CM | POA: Diagnosis present

## 2017-03-09 DIAGNOSIS — Z9049 Acquired absence of other specified parts of digestive tract: Secondary | ICD-10-CM

## 2017-03-09 DIAGNOSIS — Z960 Presence of urogenital implants: Secondary | ICD-10-CM | POA: Diagnosis not present

## 2017-03-09 DIAGNOSIS — S14109A Unspecified injury at unspecified level of cervical spinal cord, initial encounter: Secondary | ICD-10-CM | POA: Diagnosis present

## 2017-03-09 DIAGNOSIS — N136 Pyonephrosis: Secondary | ICD-10-CM | POA: Diagnosis present

## 2017-03-09 DIAGNOSIS — Z931 Gastrostomy status: Secondary | ICD-10-CM

## 2017-03-09 DIAGNOSIS — Z79891 Long term (current) use of opiate analgesic: Secondary | ICD-10-CM

## 2017-03-09 DIAGNOSIS — Y846 Urinary catheterization as the cause of abnormal reaction of the patient, or of later complication, without mention of misadventure at the time of the procedure: Secondary | ICD-10-CM | POA: Diagnosis present

## 2017-03-09 DIAGNOSIS — J189 Pneumonia, unspecified organism: Secondary | ICD-10-CM

## 2017-03-09 DIAGNOSIS — Z801 Family history of malignant neoplasm of trachea, bronchus and lung: Secondary | ICD-10-CM | POA: Diagnosis not present

## 2017-03-09 DIAGNOSIS — T83518A Infection and inflammatory reaction due to other urinary catheter, initial encounter: Secondary | ICD-10-CM | POA: Diagnosis not present

## 2017-03-09 DIAGNOSIS — N1330 Unspecified hydronephrosis: Secondary | ICD-10-CM

## 2017-03-09 DIAGNOSIS — Z720 Tobacco use: Secondary | ICD-10-CM | POA: Diagnosis present

## 2017-03-09 DIAGNOSIS — T402X5A Adverse effect of other opioids, initial encounter: Secondary | ICD-10-CM | POA: Diagnosis present

## 2017-03-09 DIAGNOSIS — N390 Urinary tract infection, site not specified: Secondary | ICD-10-CM | POA: Diagnosis not present

## 2017-03-09 DIAGNOSIS — Z880 Allergy status to penicillin: Secondary | ICD-10-CM

## 2017-03-09 HISTORY — PX: CYSTOSCOPY W/ URETERAL STENT PLACEMENT: SHX1429

## 2017-03-09 LAB — I-STAT TROPONIN, ED: Troponin i, poc: 0 ng/mL (ref 0.00–0.08)

## 2017-03-09 LAB — GLUCOSE, CAPILLARY
GLUCOSE-CAPILLARY: 128 mg/dL — AB (ref 65–99)
Glucose-Capillary: 177 mg/dL — ABNORMAL HIGH (ref 65–99)

## 2017-03-09 LAB — URINALYSIS, ROUTINE W REFLEX MICROSCOPIC
Bilirubin Urine: NEGATIVE
GLUCOSE, UA: NEGATIVE mg/dL
KETONES UR: NEGATIVE mg/dL
NITRITE: NEGATIVE
PH: 6 (ref 5.0–8.0)
Protein, ur: 100 mg/dL — AB
Specific Gravity, Urine: 1.018 (ref 1.005–1.030)

## 2017-03-09 LAB — CBC WITH DIFFERENTIAL/PLATELET
Basophils Absolute: 0 10*3/uL (ref 0.0–0.1)
Basophils Relative: 0 %
Eosinophils Absolute: 0.4 10*3/uL (ref 0.0–0.7)
Eosinophils Relative: 2 %
HEMATOCRIT: 36.2 % (ref 36.0–46.0)
HEMOGLOBIN: 12.1 g/dL (ref 12.0–15.0)
LYMPHS ABS: 1.1 10*3/uL (ref 0.7–4.0)
LYMPHS PCT: 7 %
MCH: 33.1 pg (ref 26.0–34.0)
MCHC: 33.4 g/dL (ref 30.0–36.0)
MCV: 98.9 fL (ref 78.0–100.0)
MONOS PCT: 6 %
Monocytes Absolute: 0.9 10*3/uL (ref 0.1–1.0)
NEUTROS PCT: 85 %
Neutro Abs: 13.8 10*3/uL — ABNORMAL HIGH (ref 1.7–7.7)
Platelets: 321 10*3/uL (ref 150–400)
RBC: 3.66 MIL/uL — AB (ref 3.87–5.11)
RDW: 15.4 % (ref 11.5–15.5)
WBC: 16.1 10*3/uL — AB (ref 4.0–10.5)

## 2017-03-09 LAB — COMPREHENSIVE METABOLIC PANEL
ALBUMIN: 3.4 g/dL — AB (ref 3.5–5.0)
ALT: 53 U/L (ref 14–54)
ANION GAP: 15 (ref 5–15)
AST: 63 U/L — ABNORMAL HIGH (ref 15–41)
Alkaline Phosphatase: 78 U/L (ref 38–126)
BUN: 22 mg/dL — ABNORMAL HIGH (ref 6–20)
CHLORIDE: 91 mmol/L — AB (ref 101–111)
CO2: 33 mmol/L — AB (ref 22–32)
Calcium: 9.9 mg/dL (ref 8.9–10.3)
Creatinine, Ser: 0.86 mg/dL (ref 0.44–1.00)
GFR calc non Af Amer: >60 mL/min (ref >60)
GLUCOSE: 210 mg/dL — AB (ref 65–99)
Potassium: 3 mmol/L — ABNORMAL LOW (ref 3.5–5.1)
SODIUM: 139 mmol/L (ref 135–145)
Total Bilirubin: 0.2 mg/dL — ABNORMAL LOW (ref 0.3–1.2)
Total Protein: 7.8 g/dL (ref 6.5–8.1)

## 2017-03-09 LAB — CREATININE, SERUM: Creatinine, Ser: 0.81 mg/dL (ref 0.44–1.00)

## 2017-03-09 LAB — I-STAT CG4 LACTIC ACID, ED
Lactic Acid, Venous: 2.14 mmol/L (ref 0.5–1.9)
Lactic Acid, Venous: 2.22 mmol/L (ref 0.5–1.9)

## 2017-03-09 LAB — CBC
HEMATOCRIT: 33.8 % — AB (ref 36.0–46.0)
HEMOGLOBIN: 11 g/dL — AB (ref 12.0–15.0)
MCH: 32.5 pg (ref 26.0–34.0)
MCHC: 32.5 g/dL (ref 30.0–36.0)
MCV: 100 fL (ref 78.0–100.0)
Platelets: 284 10*3/uL (ref 150–400)
RBC: 3.38 MIL/uL — AB (ref 3.87–5.11)
RDW: 15.9 % — ABNORMAL HIGH (ref 11.5–15.5)
WBC: 19 10*3/uL — AB (ref 4.0–10.5)

## 2017-03-09 LAB — CBG MONITORING, ED: GLUCOSE-CAPILLARY: 123 mg/dL — AB (ref 65–99)

## 2017-03-09 LAB — LIPASE, BLOOD: Lipase: 22 U/L (ref 11–51)

## 2017-03-09 SURGERY — CYSTOSCOPY, WITH RETROGRADE PYELOGRAM AND URETERAL STENT INSERTION
Anesthesia: General | Site: Urethra | Laterality: Right

## 2017-03-09 MED ORDER — OSMOLITE 1.2 CAL PO LIQD: 100.0000 mL | ORAL | Status: DC

## 2017-03-09 MED ORDER — PROPOFOL 10 MG/ML IV BOLUS
INTRAVENOUS | Status: AC
  Filled 2017-03-09: qty 20

## 2017-03-09 MED ORDER — IOPAMIDOL (ISOVUE-300) INJECTION 61%
100.0000 mL | Freq: Once | INTRAVENOUS | Status: AC | PRN
  Administered 2017-03-09: 100 mL via INTRAVENOUS

## 2017-03-09 MED ORDER — OXYCODONE HCL 5 MG PO TABS
10.0000 mg | ORAL_TABLET | Freq: Once | ORAL | Status: AC
  Administered 2017-03-09: 10 mg via ORAL
  Filled 2017-03-09: qty 2

## 2017-03-09 MED ORDER — MORPHINE SULFATE (PF) 2 MG/ML IV SOLN
2.0000 mg | INTRAVENOUS | Status: DC | PRN
  Administered 2017-03-09: 2 mg via INTRAVENOUS
  Filled 2017-03-09: qty 1

## 2017-03-09 MED ORDER — GABAPENTIN 400 MG PO CAPS
400.0000 mg | ORAL_CAPSULE | Freq: Once | ORAL | Status: AC
  Administered 2017-03-09: 400 mg via ORAL
  Filled 2017-03-09: qty 1

## 2017-03-09 MED ORDER — BACLOFEN 10 MG PO TABS
20.0000 mg | ORAL_TABLET | Freq: Three times a day (TID) | ORAL | Status: DC
  Administered 2017-03-09 – 2017-03-12 (×8): 20 mg via ORAL
  Filled 2017-03-09 (×8): qty 2

## 2017-03-09 MED ORDER — DM-GUAIFENESIN ER 30-600 MG PO TB12: 1.0000 | ORAL_TABLET | Freq: Two times a day (BID) | ORAL | Status: DC | PRN

## 2017-03-09 MED ORDER — POLYVINYL ALCOHOL 1.4 % OP SOLN
1.0000 [drp] | OPHTHALMIC | Status: DC | PRN
  Filled 2017-03-09: qty 15

## 2017-03-09 MED ORDER — LORAZEPAM 2 MG/ML IJ SOLN
0.5000 mg | INTRAMUSCULAR | Status: DC | PRN
  Administered 2017-03-09 – 2017-03-12 (×8): 0.5 mg via INTRAVENOUS
  Filled 2017-03-09 (×10): qty 1

## 2017-03-09 MED ORDER — LIP MEDEX EX OINT
1.0000 | TOPICAL_OINTMENT | CUTANEOUS | Status: DC | PRN
Start: 2017-03-09 — End: 2017-03-12
  Filled 2017-03-09: qty 7

## 2017-03-09 MED ORDER — TIZANIDINE HCL 4 MG PO TABS
4.0000 mg | ORAL_TABLET | Freq: Three times a day (TID) | ORAL | Status: DC
  Administered 2017-03-09 – 2017-03-12 (×8): 4 mg via ORAL
  Filled 2017-03-09 (×8): qty 1

## 2017-03-09 MED ORDER — HEPARIN SODIUM (PORCINE) 5000 UNIT/ML IJ SOLN
5000.0000 [IU] | Freq: Three times a day (TID) | INTRAMUSCULAR | Status: DC
  Administered 2017-03-10 – 2017-03-12 (×7): 5000 [IU] via SUBCUTANEOUS
  Filled 2017-03-09 (×7): qty 1

## 2017-03-09 MED ORDER — PRO-STAT SUGAR FREE PO LIQD
30.0000 mL | Freq: Two times a day (BID) | ORAL | Status: DC
  Administered 2017-03-10: 30 mL via ORAL
  Filled 2017-03-09: qty 30

## 2017-03-09 MED ORDER — DEXAMETHASONE SODIUM PHOSPHATE 10 MG/ML IJ SOLN
INTRAMUSCULAR | Status: DC | PRN
  Administered 2017-03-09: 8 mg via INTRAVENOUS

## 2017-03-09 MED ORDER — BISACODYL 5 MG PO TBEC: 5.0000 mg | DELAYED_RELEASE_TABLET | Freq: Every day | ORAL | Status: DC | PRN

## 2017-03-09 MED ORDER — IOPAMIDOL (ISOVUE-300) INJECTION 61%
INTRAVENOUS | Status: AC
  Filled 2017-03-09: qty 100

## 2017-03-09 MED ORDER — ACETAMINOPHEN 650 MG RE SUPP: 650.0000 mg | Freq: Four times a day (QID) | RECTAL | Status: DC | PRN

## 2017-03-09 MED ORDER — MELATONIN 5 MG PO TABS
5.0000 mg | ORAL_TABLET | Freq: Every day | ORAL | Status: DC
  Administered 2017-03-09 – 2017-03-11 (×3): 5 mg via ORAL
  Filled 2017-03-09 (×5): qty 1

## 2017-03-09 MED ORDER — DULOXETINE HCL 60 MG PO CPEP
60.0000 mg | ORAL_CAPSULE | Freq: Every day | ORAL | Status: DC
  Administered 2017-03-10 – 2017-03-12 (×3): 60 mg via ORAL
  Filled 2017-03-09: qty 2
  Filled 2017-03-09: qty 1
  Filled 2017-03-09 (×2): qty 2
  Filled 2017-03-09 (×2): qty 1

## 2017-03-09 MED ORDER — OXYBUTYNIN CHLORIDE 5 MG PO TABS
5.0000 mg | ORAL_TABLET | Freq: Three times a day (TID) | ORAL | Status: DC | PRN
  Administered 2017-03-12: 5 mg via ORAL
  Filled 2017-03-09: qty 1

## 2017-03-09 MED ORDER — POLYETHYLENE GLYCOL 3350 17 G PO PACK
17.0000 g | PACK | Freq: Every day | ORAL | Status: DC
  Administered 2017-03-10 – 2017-03-11 (×2): 17 g via ORAL
  Filled 2017-03-09 (×2): qty 1

## 2017-03-09 MED ORDER — SODIUM CHLORIDE 0.9 % IV SOLN
2.0000 g | Freq: Once | INTRAVENOUS | Status: AC
  Administered 2017-03-09: 2 g via INTRAVENOUS
  Filled 2017-03-09: qty 2

## 2017-03-09 MED ORDER — POLYETHYLENE GLYCOL 3350 17 GM/SCOOP PO POWD: 17.0000 g | Freq: Every day | ORAL | Status: DC

## 2017-03-09 MED ORDER — HYDROCORTISONE 1 % EX CREA
1.0000 "application " | TOPICAL_CREAM | Freq: Three times a day (TID) | CUTANEOUS | Status: DC | PRN
  Filled 2017-03-09: qty 28

## 2017-03-09 MED ORDER — SODIUM CHLORIDE 0.9 % IV BOLUS (SEPSIS)
500.0000 mL | Freq: Once | INTRAVENOUS | Status: AC
  Administered 2017-03-09: 500 mL via INTRAVENOUS

## 2017-03-09 MED ORDER — PROMOD PO LIQD: 30.0000 mL | Freq: Two times a day (BID) | ORAL | Status: DC

## 2017-03-09 MED ORDER — MORPHINE SULFATE (PF) 4 MG/ML IV SOLN
2.0000 mg | INTRAVENOUS | Status: DC | PRN
  Administered 2017-03-10 – 2017-03-11 (×4): 2 mg via INTRAVENOUS
  Filled 2017-03-09 (×5): qty 1

## 2017-03-09 MED ORDER — SENNOSIDES-DOCUSATE SODIUM 8.6-50 MG PO TABS
1.0000 | ORAL_TABLET | Freq: Every evening | ORAL | Status: DC | PRN
  Filled 2017-03-09: qty 1

## 2017-03-09 MED ORDER — IPRATROPIUM-ALBUTEROL 0.5-2.5 (3) MG/3ML IN SOLN
3.0000 mL | Freq: Three times a day (TID) | RESPIRATORY_TRACT | Status: DC
  Administered 2017-03-10 – 2017-03-12 (×7): 3 mL via RESPIRATORY_TRACT
  Filled 2017-03-09 (×6): qty 3

## 2017-03-09 MED ORDER — POTASSIUM CHLORIDE 10 MEQ/100ML IV SOLN
10.0000 meq | INTRAVENOUS | Status: AC
  Administered 2017-03-09: 10 meq via INTRAVENOUS
  Filled 2017-03-09 (×2): qty 100

## 2017-03-09 MED ORDER — MIDAZOLAM HCL 2 MG/2ML IJ SOLN
INTRAMUSCULAR | Status: AC
  Filled 2017-03-09: qty 2

## 2017-03-09 MED ORDER — SULFAMETHOXAZOLE-TRIMETHOPRIM 800-160 MG PO TABS: 1.0000 | ORAL_TABLET | Freq: Two times a day (BID) | ORAL | Status: DC

## 2017-03-09 MED ORDER — SODIUM CHLORIDE 0.9 % IV BOLUS (SEPSIS)
1000.0000 mL | Freq: Once | INTRAVENOUS | Status: AC
  Administered 2017-03-09: 1000 mL via INTRAVENOUS

## 2017-03-09 MED ORDER — 0.9 % SODIUM CHLORIDE (POUR BTL) OPTIME
TOPICAL | Status: DC | PRN
  Administered 2017-03-09: 1000 mL

## 2017-03-09 MED ORDER — SODIUM CHLORIDE 0.9 % IR SOLN
Status: DC | PRN
  Administered 2017-03-09: 3000 mL via INTRAVESICAL

## 2017-03-09 MED ORDER — MAGNESIUM CITRATE PO SOLN: 1.0000 | Freq: Once | ORAL | Status: DC | PRN

## 2017-03-09 MED ORDER — SODIUM CHLORIDE 0.9 % IV SOLN
2.0000 g | Freq: Two times a day (BID) | INTRAVENOUS | Status: DC
  Administered 2017-03-09: 2 g via INTRAVENOUS
  Filled 2017-03-09 (×2): qty 2

## 2017-03-09 MED ORDER — SODIUM CHLORIDE 0.9 % IV SOLN
500.0000 mg | Freq: Once | INTRAVENOUS | Status: AC
  Administered 2017-03-09: 500 mg via INTRAVENOUS
  Filled 2017-03-09: qty 500

## 2017-03-09 MED ORDER — MIDODRINE HCL 5 MG PO TABS
10.0000 mg | ORAL_TABLET | Freq: Three times a day (TID) | ORAL | Status: DC
  Administered 2017-03-10 – 2017-03-12 (×8): 10 mg
  Filled 2017-03-09 (×8): qty 2

## 2017-03-09 MED ORDER — FENTANYL CITRATE (PF) 100 MCG/2ML IJ SOLN
INTRAMUSCULAR | Status: AC
  Filled 2017-03-09: qty 2

## 2017-03-09 MED ORDER — PROMETHAZINE HCL 25 MG/ML IJ SOLN: 6.2500 mg | INTRAMUSCULAR | Status: DC | PRN

## 2017-03-09 MED ORDER — FENTANYL CITRATE (PF) 100 MCG/2ML IJ SOLN
INTRAMUSCULAR | Status: AC
  Filled 2017-03-09: qty 4

## 2017-03-09 MED ORDER — IPRATROPIUM-ALBUTEROL 0.5-2.5 (3) MG/3ML IN SOLN
3.0000 mL | RESPIRATORY_TRACT | Status: DC | PRN
  Administered 2017-03-10: 3 mL via RESPIRATORY_TRACT
  Filled 2017-03-09: qty 3

## 2017-03-09 MED ORDER — HYDROCORTISONE 2.5 % RE CREA
1.0000 "application " | TOPICAL_CREAM | Freq: Four times a day (QID) | RECTAL | Status: DC | PRN
  Filled 2017-03-09: qty 28.35

## 2017-03-09 MED ORDER — ACETAMINOPHEN 325 MG PO TABS: 650.0000 mg | ORAL_TABLET | Freq: Four times a day (QID) | ORAL | Status: DC | PRN

## 2017-03-09 MED ORDER — ONDANSETRON HCL 4 MG/2ML IJ SOLN
INTRAMUSCULAR | Status: DC | PRN
  Administered 2017-03-09: 4 mg via INTRAVENOUS

## 2017-03-09 MED ORDER — ZOLPIDEM TARTRATE 5 MG PO TABS
5.0000 mg | ORAL_TABLET | Freq: Every evening | ORAL | Status: DC | PRN
  Filled 2017-03-09: qty 1

## 2017-03-09 MED ORDER — IPRATROPIUM-ALBUTEROL 0.5-2.5 (3) MG/3ML IN SOLN
3.0000 mL | Freq: Four times a day (QID) | RESPIRATORY_TRACT | Status: DC
  Administered 2017-03-09 (×2): 3 mL via RESPIRATORY_TRACT
  Filled 2017-03-09 (×2): qty 3

## 2017-03-09 MED ORDER — ONDANSETRON HCL 4 MG/2ML IJ SOLN
4.0000 mg | Freq: Four times a day (QID) | INTRAMUSCULAR | Status: DC | PRN
  Administered 2017-03-09: 4 mg via INTRAVENOUS
  Filled 2017-03-09: qty 2

## 2017-03-09 MED ORDER — PHENOL 1.4 % MT LIQD
1.0000 | OROMUCOSAL | Status: DC | PRN
  Filled 2017-03-09: qty 177

## 2017-03-09 MED ORDER — FENTANYL CITRATE (PF) 100 MCG/2ML IJ SOLN
25.0000 ug | INTRAMUSCULAR | Status: DC | PRN
  Administered 2017-03-09 (×2): 50 ug via INTRAVENOUS

## 2017-03-09 MED ORDER — OSMOLITE 1.2 CAL PO LIQD
10.0000 mL/h | ORAL | Status: AC
  Administered 2017-03-09: 10 mL/h

## 2017-03-09 MED ORDER — MAGNESIUM HYDROXIDE 400 MG/5ML PO SUSP
5.0000 mL | Freq: Every day | ORAL | Status: DC | PRN
  Filled 2017-03-09: qty 30

## 2017-03-09 MED ORDER — GABAPENTIN 400 MG PO CAPS
400.0000 mg | ORAL_CAPSULE | Freq: Four times a day (QID) | ORAL | Status: DC
  Administered 2017-03-09 – 2017-03-12 (×10): 400 mg via ORAL
  Filled 2017-03-09 (×10): qty 1

## 2017-03-09 MED ORDER — MUSCLE RUB 10-15 % EX CREA
1.0000 "application " | TOPICAL_CREAM | CUTANEOUS | Status: DC | PRN
  Filled 2017-03-09: qty 85

## 2017-03-09 MED ORDER — VITAMIN B-12 1000 MCG PO TABS
1000.0000 ug | ORAL_TABLET | Freq: Every day | ORAL | Status: DC
  Administered 2017-03-10 – 2017-03-12 (×3): 1000 ug via ORAL
  Filled 2017-03-09 (×3): qty 1

## 2017-03-09 MED ORDER — SODIUM CHLORIDE 0.9 % IV SOLN
INTRAVENOUS | Status: DC
  Administered 2017-03-09: 14:00:00 via INTRAVENOUS

## 2017-03-09 MED ORDER — ALUM & MAG HYDROXIDE-SIMETH 200-200-20 MG/5ML PO SUSP: 30.0000 mL | ORAL | Status: DC | PRN

## 2017-03-09 MED ORDER — INSULIN ASPART 100 UNIT/ML ~~LOC~~ SOLN: 0.0000 [IU] | Freq: Every day | SUBCUTANEOUS | Status: DC

## 2017-03-09 MED ORDER — INSULIN ASPART 100 UNIT/ML ~~LOC~~ SOLN
0.0000 [IU] | Freq: Three times a day (TID) | SUBCUTANEOUS | Status: DC
  Administered 2017-03-09: 1 [IU] via SUBCUTANEOUS
  Administered 2017-03-10: 3 [IU] via SUBCUTANEOUS
  Administered 2017-03-10 – 2017-03-12 (×5): 1 [IU] via SUBCUTANEOUS
  Filled 2017-03-09: qty 1

## 2017-03-09 MED ORDER — PROPOFOL 500 MG/50ML IV EMUL
INTRAVENOUS | Status: DC | PRN
  Administered 2017-03-09: 50 ug/kg/min via INTRAVENOUS

## 2017-03-09 MED ORDER — MEPERIDINE HCL 50 MG/ML IJ SOLN: 6.2500 mg | INTRAMUSCULAR | Status: DC | PRN

## 2017-03-09 MED ORDER — SALINE SPRAY 0.65 % NA SOLN
1.0000 | NASAL | Status: DC | PRN
  Filled 2017-03-09: qty 44

## 2017-03-09 MED ORDER — NALOXEGOL OXALATE 25 MG PO TABS
25.0000 mg | ORAL_TABLET | Freq: Every day | ORAL | Status: DC
  Administered 2017-03-10 – 2017-03-12 (×2): 25 mg via ORAL
  Filled 2017-03-09 (×3): qty 1

## 2017-03-09 MED ORDER — LORATADINE 10 MG PO TABS: 10.0000 mg | ORAL_TABLET | Freq: Every day | ORAL | Status: DC | PRN

## 2017-03-09 MED ORDER — OXYCODONE HCL 5 MG PO TABS
10.0000 mg | ORAL_TABLET | Freq: Four times a day (QID) | ORAL | Status: DC | PRN
Start: 2017-03-09 — End: 2017-03-12
  Administered 2017-03-10 – 2017-03-12 (×5): 10 mg via ORAL
  Filled 2017-03-09 (×5): qty 2

## 2017-03-09 MED ORDER — SODIUM CHLORIDE 0.9 % IV SOLN
500.0000 mg | INTRAVENOUS | Status: DC
  Administered 2017-03-10 – 2017-03-11 (×2): 500 mg via INTRAVENOUS
  Filled 2017-03-09 (×2): qty 500

## 2017-03-09 MED ORDER — LIP MEDEX EX OINT
TOPICAL_OINTMENT | Freq: Once | CUTANEOUS | Status: AC
  Administered 2017-03-09: 11:00:00 via TOPICAL
  Filled 2017-03-09: qty 7

## 2017-03-09 MED ORDER — ONDANSETRON HCL 4 MG PO TABS: 4.0000 mg | ORAL_TABLET | Freq: Four times a day (QID) | ORAL | Status: DC | PRN

## 2017-03-09 MED ORDER — ONDANSETRON HCL 4 MG PO TABS: 4.0000 mg | ORAL_TABLET | Freq: Three times a day (TID) | ORAL | Status: DC | PRN

## 2017-03-09 MED ORDER — FREE WATER
100.0000 mL | Freq: Three times a day (TID) | Status: DC
  Administered 2017-03-09 – 2017-03-12 (×8): 100 mL

## 2017-03-09 MED ORDER — MIDAZOLAM HCL 5 MG/5ML IJ SOLN
INTRAMUSCULAR | Status: DC | PRN
  Administered 2017-03-09 (×2): 1 mg via INTRAVENOUS

## 2017-03-09 MED ORDER — LACTATED RINGERS IV SOLN: INTRAVENOUS | Status: DC

## 2017-03-09 MED ORDER — PROPOFOL 10 MG/ML IV BOLUS
INTRAVENOUS | Status: DC | PRN
  Administered 2017-03-09 (×3): 20 mg via INTRAVENOUS
  Administered 2017-03-09: 40 mg via INTRAVENOUS

## 2017-03-09 SURGICAL SUPPLY — 16 items
BAG URO CATCHER STRL LF (MISCELLANEOUS) ×3 IMPLANT
CATH URET 5FR 28IN OPEN ENDED (CATHETERS) ×3 IMPLANT
CLOTH BEACON ORANGE TIMEOUT ST (SAFETY) ×3 IMPLANT
COVER FOOTSWITCH UNIV (MISCELLANEOUS) ×3 IMPLANT
COVER SURGICAL LIGHT HANDLE (MISCELLANEOUS) ×3 IMPLANT
GLOVE SURG SS PI 8.0 STRL IVOR (GLOVE) ×3 IMPLANT
GOWN STRL REUS W/TWL XL LVL3 (GOWN DISPOSABLE) ×3 IMPLANT
GUIDEWIRE AMPLAZ .035X145 (WIRE) ×3 IMPLANT
GUIDEWIRE ANG ZIPWIRE 038X150 (WIRE) ×3 IMPLANT
GUIDEWIRE STR DUAL SENSOR (WIRE) ×6 IMPLANT
MANIFOLD NEPTUNE II (INSTRUMENTS) ×3 IMPLANT
PACK CYSTO (CUSTOM PROCEDURE TRAY) ×3 IMPLANT
STENT PERCUFLEX 4.8FRX24 (STENTS) ×3 IMPLANT
STENT URET 6FRX24 CONTOUR (STENTS) ×3 IMPLANT
TUBING CONNECTING 10 (TUBING) ×2 IMPLANT
TUBING CONNECTING 10' (TUBING) ×1

## 2017-03-09 NOTE — ED Provider Notes (Signed)
[facility] DEPT Provider Note   CSN: [id number] Arrival date & time: 03/09/17  0321     History   Chief Complaint No chief complaint on file.   HPI Angela Page is a 47 y.o. female.  HPI 47 year old female past medical history significant for quadriplegia, GERD, asthma, polysubstance abuse, chronic pain medication presents to the emergency department today for evaluation of abdominal pain, constipation, cough.  Patient states that for the past week she has been having generalized abdominal pain worse in her upper abdomen.  Patient states that she has not had abdominal for the past week.  She does report passing flatulence.  She also reports nausea but denies any emesis.  Patient states that she is on chronic pain medication and was recently switched to a medication for opioid-induced constipation.  Before that she was taking MiraLAX but has not taken MiraLAX for the past 2 weeks.  Patient denies any associated fevers.  She also reports associated sputum production that is worse than baseline.  Patient is a chronic smoker and wears oxygen at baseline.  She reports some lower chest tightness.  No known sick contacts.  Denies any rhinorrhea, sore throat, wheezing, generalized body aches.  Patient does endorse that she was recently started on Bactrim last week by the wound clinic for her decubitus ulcer.  Patient does have PEG tube and indwelling suprapubic catheter.  Pt denies any fever, chill, ha, vision changes, lightheadedness, dizziness, congestion, neck pain, cp, sob, cough, abd pain melena, hematochezia, lower extremity paresthesias.  Past Medical History:  Diagnosis Date  . Anasarca 06/10/2016  . Asthma   . Cocaine use 10/08/2013  . GERD (gastroesophageal reflux disease)   . HCAP (healthcare-associated pneumonia) 06/09/2016  . Overdose of opiate or related narcotic ([place]) 09/02/2014  . Pacemaker   . Pleurisy   . Polysubstance dependence including  opioid type drug, episodic abuse ([place]) 10/27/2013  . Protein calorie malnutrition ([place]) 10/31/2015  . Quadriparesis ([place])   . Quadriplegia, C5-C7 incomplete ([place]) 10/08/2013  . Stage IV pressure ulcer of sacral region [facility]) 10/31/2015    Patient Active Problem List   Diagnosis Date Noted  . Ineffective airway clearance   . Autonomic neuropathy 06/10/2016  . Presence of permanent cardiac pacemaker 06/10/2016  . 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 Chronically on opiate therapy 06/10/2016  . Indwelling Foley catheter present 06/10/2016  . PEG (percutaneous endoscopic gastrostomy) status ([place]) 06/10/2016  . Pulmonary hypertension ([place]) 06/10/2016  . Hypoalbuminemia 06/10/2016  . Abnormal transaminases   . Peripheral edema   . Tracheostomy dependence ([place])   . Chronic obstructive pulmonary disease ([place])   . Chronic diastolic CHF (congestive heart failure) ([place]) 01/23/2016  . Tracheostomy status ([place])   . Esophageal dysphagia   . Malnutrition of moderate degree 11/01/2015  . Tobacco abuse 10/31/2015  . Asthma 10/31/2015  . History of pneumonia 10/08/2015  . Pressure ulcer of contiguous region involving buttock and hip, stage 4 ([place]) 02/26/2015  . Hypotension 02/25/2015  . Leukocytosis 10/23/2014  . Hypokalemia 10/23/2014  . Decubitus ulcer of sacral region, stage 4 ([place]) 10/23/2014  . Anemia, iron deficiency 10/23/2014  . Quadriplegia, C5-C7 incomplete ([place]) 10/22/2014  . Depression   . Protein-calorie malnutrition, severe ([place]) 09/05/2014  . 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 Recurrent UTI 03/31/2014  . Pressure ulcer of coccygeal region, stage 4 ([place]) 01/06/2014  . Stage 4 skin ulcer of sacral region ([place]) 01/06/2014  . Neuropathic pain 11/30/2013  . Paraplegia  following spinal cord injury ([place]) 11/30/2013  . Spinal cord injury, cervical region ([place]) 11/30/2013  . Chronic pain due to injury 11/30/2013  . S/P cervical spinal fusion  10/27/2013  . Tracheostomy care ([place]) 10/27/2013  . 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 SCI (spinal cord injury) 10/11/2013    Past Surgical History:  Procedure Laterality Date  . APPENDECTOMY    . BACK SURGERY  10/08/2013   fusion of spine, cervical region  . CARDIAC SURGERY    . GASTROSTOMY  11/23/2015  . I&D EXTREMITY  10/28/2011   Procedure: IRRIGATION AND DEBRIDEMENT EXTREMITY;  Surgeon: Tennis Must, MD;  Location: [facility];  Service: Orthopedics;  Laterality: Right;  Irrigation and debridement right middle finger  . INSERTION OF SUPRAPUBIC CATHETER N/A 07/28/2016   Procedure: INSERTION OF SUPRAPUBIC CATHETER;  Surgeon: Franchot Gallo, MD;  Location: [facility];  Service: Urology;  Laterality: N/A;  . IR GENERIC HISTORICAL  11/23/2015   IR GASTROSTOMY TUBE MOD SED 11/23/2015 [facility]-INTERV RAD  . TRACHEOSTOMY      OB History    No data available       Home Medications    Prior to Admission medications   Medication Sig Start Date End Date Taking? Authorizing Provider  acetaminophen (TYLENOL) 500 MG tablet Take 1,000 mg by mouth every 6 (six) hours as needed for mild pain, moderate pain, fever or headache.    Yes [provider]  albuterol (PROVENTIL HFA;VENTOLIN HFA) 108 (90 Base) MCG/ACT inhaler Inhale 1-2 puffs into the lungs every 6 (six) hours as needed for wheezing or shortness of breath.   Yes [provider]  albuterol (PROVENTIL) (2.5 MG/3ML) 0.083% nebulizer solution Take 2.5 mg by nebulization every 6 (six) hours as needed for wheezing or shortness of breath.   Yes [provider]  baclofen (LIORESAL) 20 MG tablet Take 20 mg by mouth 3 (three) times daily. 12/14/15  Yes [provider]  bisacodyl (DULCOLAX) 5 MG EC tablet Take 5 mg by mouth daily as needed for moderate constipation.   Yes [provider]  DULoxetine (CYMBALTA) 60 MG capsule Take 1 capsule (60 mg total) by mouth daily. 01/28/17  Yes Briscoe Deutscher, DO  gabapentin (NEURONTIN) 400 MG capsule Take 1 capsule (400 mg total) by mouth 4 (four) times daily. 09/27/16 04/25/17 Yes Briscoe Deutscher, DO  ibuprofen (ADVIL,MOTRIN) 200 MG tablet Place 2 tablets (400 mg total) into feeding tube every 6 (six) hours as needed for moderate pain. Patient taking differently: Place 400-600 mg into feeding tube every 6 (six) hours as needed for fever, headache, mild pain, moderate pain or cramping.  11/26/15  Yes Florencia Reasons, MD  lidocaine (LIDODERM) 5 % Place 1 patch onto the skin 2 (two) times daily as needed (pain).  04/04/15  Yes [provider]  magnesium citrate SOLN Take 1 Bottle by mouth once.   Yes [provider]  magnesium hydroxide (MILK OF MAGNESIA) 400 MG/5ML suspension Take 5 mLs by mouth daily as needed for mild constipation. 11/10/16  Yes Briscoe Deutscher, DO  Melatonin 5 MG TABS Take 1 tablet (5 mg total) by mouth at bedtime. 11/10/16  Yes Briscoe Deutscher, DO  midodrine (PROAMATINE) 10 MG tablet PLACE 1 TABLET (10 MG TOTAL) INTO FEEDING TUBE 3 (THREE) TIMES DAILY WITH MEALS. 02/25/17  Yes Briscoe Deutscher, DO  naloxegol oxalate (MOVANTIK) 25 MG TABS tablet Take 25 mg by mouth daily.   Yes [provider]  Nutritional Supplements (FEEDING SUPPLEMENT, OSMOLITE  1.2 CAL,) LIQD Place 100 mLs into feeding tube See admin instructions. Given for 10 hours at a time.  Given between 2300 - 0900 the next day.   Yes [provider]  Nutritional Supplements (FEEDING SUPPLEMENT, OSMOLITE 1.2 CAL,) LIQD Place 1,000 mLs into feeding tube continuous. 01/28/17  Yes Briscoe Deutscher, DO  Nutritional Supplements (PROMOD) LIQD Take 30 mLs by mouth 2 (two) times daily.   Yes [provider]  nystatin (MYCOSTATIN) 100000 UNIT/ML suspension Take 5 mLs (500,000 Units total) by mouth 4 (four) times daily. Patient taking differently: Take 500,000 Units by mouth 4 (four) times daily as needed (tongue swelling).  12/09/16  Yes Nat Christen, MD    ondansetron (ZOFRAN) 4 MG tablet Take 1 tablet (4 mg total) by mouth every 8 (eight) hours as needed for nausea or vomiting. 11/10/16  Yes Briscoe Deutscher, DO  oxybutynin (DITROPAN) 5 MG tablet Take 5 mg by mouth every 8 (eight) hours as needed for bladder spasms. 07/11/16  Yes [provider]  Oxycodone HCl 10 MG TABS Take 10 mg by mouth 4 (four) times daily as needed (for pain).    Yes [provider]  OXYGEN Inhale 3 L into the lungs continuous.   Yes [provider]  polyethylene glycol powder (GLYCOLAX/MIRALAX) powder Take 17 g by mouth daily. 11/10/16  Yes Briscoe Deutscher, DO  Skin Protectants, Misc. (BALMEX SKIN PROTECTANT EX) Apply 1 application topically 3 (three) times daily as needed (redness/irritation).    Yes [provider]  sulfamethoxazole-trimethoprim (BACTRIM DS,SEPTRA DS) 800-160 MG tablet Take 1 tablet by mouth 2 (two) times daily. 03/02/17  Yes [provider]  tiotropium (SPIRIVA HANDIHALER) 18 MCG inhalation capsule Place 1 capsule (18 mcg total) into inhaler and inhale daily. 02/20/16  Yes Erick Colace, NP  tiZANidine (ZANAFLEX) 4 MG tablet TAKE 1 TABLET (4 MG TOTAL) BY MOUTH 3 (THREE) TIMES DAILY. 01/27/17  Yes Briscoe Deutscher, DO  vitamin B-12 (CYANOCOBALAMIN) 1000 MCG tablet Take 1,000 mcg by mouth daily.   Yes [provider]  Water For Irrigation, Sterile (FREE WATER) SOLN Place 100 mLs into feeding tube every 8 (eight) hours. 11/26/15  Yes Florencia Reasons, MD  zolpidem (AMBIEN) 5 MG tablet Take 2 tablets (10 mg total) by mouth at bedtime as needed for sleep. 07/28/16  Yes Dahlstedt, Annie Main, MD  furosemide (LASIX) 40 MG tablet TAKE 1 TABLET BY MOUTH EVERY DAY 03/09/17   Briscoe Deutscher, DO    Family History Family History  Problem Relation Age of Onset  . Hypertension Maternal Uncle   . Kidney failure Maternal Uncle   . Colon cancer Mother   . Lung cancer Father   . Hypertension Brother   . Kidney failure Maternal Aunt   .  Kidney failure Maternal Uncle     Social History Social History   Tobacco Use  . Smoking status: Current Every Day Smoker    Packs/day: 1.00    Years: 25.00    Pack years: 25.00    Types: Cigarettes  . Smokeless tobacco: Never Used  Substance Use Topics  . Alcohol use: Yes    Comment: 2 x weekly  . Drug use: No     Allergies   Erythromycin; Nitrofurantoin monohyd macro; and Penicillins   Review of Systems Review of Systems  All other systems reviewed and are negative.    Physical Exam Updated Vital Signs BP (!) 89/58   Pulse 91   Temp 99 F (37.2 C) (Rectal) Comment: Rectal  temp taken by Dorothea Ogle the PA.  Resp 12   LMP 05/27/2016 (Within Days) Comment: Irregular periods since October 2015.  SpO2 100%   Physical Exam Constitutional: No distress.  Chronically ill-appearing female, nontoxic  HENT:  Head: Atraumatic.  Eyes: Conjunctivae are normal.  Neck: Neck supple.  Cardiovascular: Normal rate and regular rhythm.   Pulmonary/Chest:  Decreased breath sounds with poor effort.  Abdominal: She exhibits distension. There is tenderness.  Abdomen is distended, bowel sounds presents, tenderness to epigastric region on palpation without guarding or rebound tenderness. Suprapubic catheter in place with normal surrounding skin changes. PEG tube site appears normal.  GU: Chaperone present for exam. Pt tolerated without difficulty. No external hemorrhoids or fissures noted. No pain with palpation of the rectal vault. No internal hemorrhoids noted. Soft brown stool noted in the rectal vault. No gross hematochezia or melena.  Neurological: She is alert.  Quadriplegia, able to move her upper extremity without the use of the hands  Skin: No rash noted.  Psychiatric: She has a normal mood and affect.  Nursing note and vitals reviewed.  ED Treatments / Results  Labs (all labs ordered are listed, but only abnormal results are displayed) Labs Reviewed  COMPREHENSIVE METABOLIC  PANEL - Abnormal; Notable for the following components:      Result Value   Potassium 3.0 (*)    Chloride 91 (*)    CO2 33 (*)    Glucose, Bld 210 (*)    BUN 22 (*)    Albumin 3.4 (*)    AST 63 (*)    Total Bilirubin 0.2 (*)    All other components within normal limits  CBC WITH DIFFERENTIAL/PLATELET - Abnormal; Notable for the following components:   WBC 16.1 (*)    RBC 3.66 (*)    Neutro Abs 13.8 (*)    All other components within normal limits  URINALYSIS, ROUTINE W REFLEX MICROSCOPIC - Abnormal; Notable for the following components:   APPearance CLOUDY (*)    Hgb urine dipstick LARGE (*)    Protein, ur 100 (*)    Leukocytes, UA LARGE (*)    Bacteria, UA MANY (*)    Squamous Epithelial / LPF 0-5 (*)    Non Squamous Epithelial 0-5 (*)    All other components within normal limits  I-STAT CG4 LACTIC ACID, ED - Abnormal; Notable for the following components:   Lactic Acid, Venous 2.14 (*)    All other components within normal limits  URINE CULTURE  CULTURE, BLOOD (ROUTINE X 2)  CULTURE, BLOOD (ROUTINE X 2)  LIPASE, BLOOD  I-STAT TROPONIN, ED    EKG  EKG Interpretation  Date/Time:  Monday March 09 2017 07:37:50 EST Ventricular Rate:  84 PR Interval:    QRS Duration: 88 QT Interval:  392 QTC Calculation: 464 R Axis:   42 Text Interpretation:  Sinus rhythm Probable left atrial enlargement no pacer spikes seen Otherwise no significant change Confirmed by Deno Etienne [id number]) on 03/09/2017 7:43:33 AM Also confirmed by Deno Etienne [phone]), editor Hattie Perch [phone])  on 03/09/2017 8:00:17 AM       Radiology Dg Chest 2 View  Result Date: 03/09/2017 CLINICAL DATA:  Abdominal tightness EXAM: CHEST  2 VIEW COMPARISON:  10/17/2016 FINDINGS: Tracheostomy in good position. Dual lead pacemaker unchanged. Negative for heart failure. Mild bibasilar airspace disease unchanged. Some of this may be scarring or atelectasis. Small pleural effusions bilaterally IMPRESSION: Mild  bibasilar airspace disease unchanged. Probable atelectasis or scarring. Small bilateral effusions. Electronically  Signed   By: Franchot Gallo M.D.   On: 03/09/2017 09:09   Ct Abdomen Pelvis W Contrast  Result Date: 03/09/2017 CLINICAL DATA:  Abdominal pain EXAM: CT ABDOMEN AND PELVIS WITH CONTRAST TECHNIQUE: Multidetector CT imaging of the abdomen and pelvis was performed using the standard protocol following bolus administration of intravenous contrast. CONTRAST:  <See Chart> ISOVUE-300 IOPAMIDOL (ISOVUE-300) INJECTION 61% COMPARISON:  11/12/2015 FINDINGS: Lower chest: Consolidation in the right lower lobe concerning for pneumonia. No effusions. Linear areas of atelectasis or scarring in the left lung base. Hepatobiliary: Geographic fatty infiltration of the liver. No focal abnormality. Gallbladder unremarkable. Pancreas: No focal abnormality or ductal dilatation. Spleen: No focal abnormality.  Normal size. Adrenals/Urinary Tract: 9 mm proximal right ureteral stone with mild hydronephrosis. Bilateral nonobstructing renal stones. No hydronephrosis on the left. No adrenal mass. Foley catheter present in the bladder which is decompressed. Stomach/Bowel: Gastrostomy tube in the stomach. No evidence of bowel obstruction. Vascular/Lymphatic: Diffuse aortic and iliac calcifications. No evidence of aneurysm or adenopathy. Reproductive: Uterus and adnexa unremarkable.  No mass. Other: No free fluid or free air. Musculoskeletal: No acute bony abnormality. IMPRESSION: 9 mm proximal right ureteral stone with mild right hydronephrosis. Bilateral nephrolithiasis. Geographic fatty infiltration of the liver. Aortic atherosclerosis. Consolidation in the right lower lobe concerning for pneumonia. Electronically Signed   By: Rolm Baptise M.D.   On: 03/09/2017 09:21    Procedures .Critical Care Performed by: Doristine Devoid, PA-C Authorized by: Doristine Devoid, PA-C   Critical care provider statement:    Critical  care time (minutes):  45   Critical care was necessary to treat or prevent imminent or life-threatening deterioration of the following conditions:  Sepsis   Critical care was time spent personally by me on the following activities:  Development of treatment plan with patient or surrogate, discussions with consultants, discussions with primary provider, evaluation of patient's response to treatment, ordering and review of laboratory studies, ordering and performing treatments and interventions, ordering and review of radiographic studies, pulse oximetry, re-evaluation of patient's condition, review of old charts and examination of patient   (including critical care time)  Medications Ordered in ED Medications  ceFEPIme (MAXIPIME) 2 g in sodium chloride 0.9 % 100 mL IVPB (not administered)  azithromycin (ZITHROMAX) 500 mg in sodium chloride 0.9 % 250 mL IVPB (not administered)  gabapentin (NEURONTIN) capsule 400 mg (400 mg Oral Given 03/09/17 0746)  sodium chloride 0.9 % bolus 500 mL (0 mLs Intravenous Stopped 03/09/17 0953)  iopamidol (ISOVUE-300) 61 % injection 100 mL (100 mLs Intravenous Contrast Given 03/09/17 0832)  lip balm (CARMEX) ointment ( Topical Given 03/09/17 1032)  oxyCODONE (Oxy IR/ROXICODONE) immediate release tablet 10 mg (10 mg Oral Given 03/09/17 1032)     Initial Impression / Assessment and Plan / ED Course  I have reviewed the triage vital signs and the nursing notes.  Pertinent labs & imaging results that were available during my care of the patient were reviewed by me and considered in my medical decision making (see chart for details).     Patient presents to the ED for evaluation of constipation, generalized abdominal pain and worsening cough and sputum production.  Patient reports last bowel movement was last week.  History of opioid-induced constipation.  She reports passing gas.  Patient reports some nausea but denies any emesis.  Patient also notes that she was started  on Bactrim last week for her decubitus ulcer.  On exam patient is overall well-appearing.  She  is wearing oxygen which is at her baseline.  Patient is a tobacco user.  She is not hypoxic.  No tachycardia noted.  Patient afebrile.  Patient is hypotensive however she states that this is baseline for her.  She typically states her blood pressure runs in the systolic of the upper 49S.  Patient does have a leukocytosis of 16,000 with history of same.  Electrolytes are reassuring.  Kidney function is normal.  Patient's troponin was negative.  UA does show signs of infection with large leukocytes, RBCs, WBCs with many bacteria.  Lipase is normal.  Lactic acid also elevated slightly at 2.14.  Given the hypotension which however patient states is her baseline the elevated white blood cell count and the elevated lactic acid will start code sepsis.  Patient remains afebrile not tachycardic in the ED.  Pressures are stable in the upper 80s.  CT scan performed shows 9 mm proximal right ureteral stone with hydronephrosis.  He also notes questionable right lower lobe pneumonia.  Blood cultures obtained prior to antibiotic initiation.  Did speak with pharmacy concerning patient's allergy and appropriate antibiotic therapy.  Will cover patient for pseudomonal coverage given her indwelling suprapubic catheter with cefepime patient has taken this in the past without any reaction.  We will also start patient on azithromycin for double coverage for the pneumonia.  Speak with Dr. Jeffie Pollock with urology was asked patient to be admitted to the hospital service n.p.o.  Will place a stent today in the hospital.  Will call hospital medicine for admission.  Back with the hospitalist for admission who will see patient in the ED and place admission orders.  Patient remains hemodynamically stable this time.  Updated on plan of care.  Final Clinical Impressions(s) / ED Diagnoses   Final diagnoses:  Sepsis, due to unspecified  organism ([place])  Ureterolithiasis  Urinary tract infection associated with catheterization of urinary tract, unspecified indwelling urinary catheter type, initial encounter [facility])  Community acquired pneumonia, unspecified laterality    ED Discharge Orders    None       Aaron Edelman 03/09/17 [place], DO 03/09/17 1131

## 2017-03-09 NOTE — Op Note (Signed)
Procedure: Cystoscopy with insertion of right double-J stent.  Preop diagnosis: 9 mm obstructing right proximal ureteral stone with infection.  Postop diagnosis: Same.  Surgeon: Dr. Irine Seal.  Anesthesia: MAC.  Drain: 4.8 [place] by 24 cm contour double-J stent on the right.  Specimen: None.  EBL: None.  Complications: None.  Indications: Angela Page is a 47 year old white female quadriplegic who was seen in the emergency room today.  A CT scan demonstrated a 9 mm right proximal ureteral stone with obstruction.  She has a chronic suprapubic tube and the urine looked infected.  There was concern for sepsis with an elevated lactic acid of 2.3.  It was felt that stent placement was indicated.  Procedure: She had been given antibiotics in the ER.  She was taken operating room where she was placed on the table in the lithotomy position and fitted with PAS hose.  Sedation was given as needed.  She was prepped with Betadine solution and draped in usual sterile fashion.  Cystoscopy was performed using the 23 [place] scope and 30 degree lens.  Examination revealed a normal urethra.  There was some edema of the trigone related to the suprapubic tube.  And also edema just above the right trigone where the tip of the suprapubic tube rested.  The bladder wall was smooth with relatively diffuse erythema from inflammation.  The suprapubic tube was in good position.  The left ureteral orifice was not identified.  The right ureteral orifice was in the normal anatomic position.  A 5 French opening catheter was passed into the proximal ureter.  This was followed by a sensor wire which was passed to the kidney.  The opening catheter was removed and an initial attempt was made to pass a 6 [place] by 24 cm contour double-J stent.  The attempt was unsuccessful because of the stone impacted at the UPJ.  I then attempted a Glidewire which I was able to advance into the kidney.  I was unable to get the opening catheter  past the stone.  I then changed to an Amplatz Super Stiff wire and with this wire I was able to get the opening catheter by the stone into the kidney.  The opening catheter was removed and a 4.8 [place] by 24 cm contour double-J stent was advanced over the wire into the kidney with success.  The wire was removed leaving a good coil in the kidney and a good coil in the bladder.  There was efflux from the stent loop.  The cystoscope was then removed.  She was taken down from lithotomy position and moved to recovery room in stable condition.  There were no complications.

## 2017-03-09 NOTE — ED Notes (Signed)
Spoke with EDP concerning patient and symptoms. No new orders.

## 2017-03-09 NOTE — H&P (Signed)
History and Physical    Angela Page NOM:[id number] DOB: 1970/10/09 DOA: 03/09/2017  PCP: Briscoe Deutscher, DO Patient coming from: home  Chief Complaint: home  HPI: Angela Page is a 47 y.o. female with medical history significant of tobacco use, quadriplegia, COPD, urinary retention status post suprapubic catheter in place came to the hospital with complaints of bilateral abdominal pain.  Patient states she has been dealing with chronic constipation for some time due to chronic opioid use from her pain and recently her MiraLAX was stopped and her pain management physician had placed her on something else which she cannot recall.  After 4 days she had her first bowel movement yesterday but around that time she started also experiencing bilateral abdominal pain.  States her bilateral abdominal pain is dull aching bilaterally but sometimes it becomes very intense along with the feeling of nausea and vomiting.  Previously she has had renal stone back in 2016 requiring surgical intervention. She has pacemaker in place since her accident in 2015 as there is concerns of some AV nodal block at that time.  She does not follow a cardiologist but has a machine at home to interpret this.  In the ER patient was noted to be slightly hypotensive but afebrile.  He had relative leukocytosis and elevated lactate.  Her UA was suggestive of urinary tract infection.  CT of the abdomen pelvis showed 9 mm renal stone causing right-sided hydro-, bilateral nephrolithiasis.  Potassium was 3.0.  Chest x-ray showed right lower lobe pneumonia which was also seen on CT of the abdomen pelvis.  Case was discussed by the ER provider with the on-call urology who plans on taking the patient to the OR today.  Patient was admitted for further care.   Review of Systems: As per HPI otherwise 10 point review of systems negative.   Past Medical History:  Diagnosis Date  . Anasarca 06/10/2016  . Asthma   . Cocaine use  10/08/2013  . GERD (gastroesophageal reflux disease)   . HCAP (healthcare-associated pneumonia) 06/09/2016  . Overdose of opiate or related narcotic ([place]) 09/02/2014  . Pacemaker   . Pleurisy   . Polysubstance dependence including opioid type drug, episodic abuse ([place]) 10/27/2013  . Protein calorie malnutrition ([place]) 10/31/2015  . Quadriparesis ([place])   . Quadriplegia, C5-C7 incomplete ([place]) 10/08/2013  . Stage IV pressure ulcer of sacral region ([place]) 10/31/2015    Past Surgical History:  Procedure Laterality Date  . APPENDECTOMY    . BACK SURGERY  10/08/2013   fusion of spine, cervical region  . CARDIAC SURGERY    . GASTROSTOMY  11/23/2015  . I&D EXTREMITY  10/28/2011   Procedure: IRRIGATION AND DEBRIDEMENT EXTREMITY;  Surgeon: Tennis Must, MD;  Location: [facility];  Service: Orthopedics;  Laterality: Right;  Irrigation and debridement right middle finger  . INSERTION OF SUPRAPUBIC CATHETER N/A 07/28/2016   Procedure: INSERTION OF SUPRAPUBIC CATHETER;  Surgeon: Franchot Gallo, MD;  Location: [facility];  Service: Urology;  Laterality: N/A;  . IR GENERIC HISTORICAL  11/23/2015   IR GASTROSTOMY TUBE MOD SED 11/23/2015 [facility] RAD  . TRACHEOSTOMY       reports that she has been smoking cigarettes.  She has a 25.00 pack-year smoking history. she has never used smokeless tobacco. She reports that she drinks alcohol. She reports that she does not use drugs.  Allergies  Allergen Reactions  . Erythromycin Nausea And Vomiting  . Nitrofurantoin Monohyd Macro Nausea And Vomiting  . Penicillins  Hives    Has patient had a PCN reaction causing immediate rash, facial/tongue/throat swelling, SOB or lightheadedness with hypotension: Yes Has patient had a PCN reaction causing severe rash involving mucus membranes or skin necrosis: Yes Has patient had a PCN reaction that required hospitalization No Has patient had a PCN reaction occurring within the last 10 years: No-childhood  allergy If all of the above answers are "NO", then may proceed with Cephalosporin     Family History  Problem Relation Age of Onset  . Hypertension Maternal Uncle   . Kidney failure Maternal Uncle   . Colon cancer Mother   . Lung cancer Father   . Hypertension Brother   . Kidney failure Maternal Aunt   . Kidney failure Maternal Uncle      Prior to Admission medications   Medication Sig Start Date End Date Taking? Authorizing Provider  acetaminophen (TYLENOL) 500 MG tablet Take 1,000 mg by mouth every 6 (six) hours as needed for mild pain, moderate pain, fever or headache.    Yes [provider]  albuterol (PROVENTIL HFA;VENTOLIN HFA) 108 (90 Base) MCG/ACT inhaler Inhale 1-2 puffs into the lungs every 6 (six) hours as needed for wheezing or shortness of breath.   Yes [provider]  albuterol (PROVENTIL) (2.5 MG/3ML) 0.083% nebulizer solution Take 2.5 mg by nebulization every 6 (six) hours as needed for wheezing or shortness of breath.   Yes [provider]  baclofen (LIORESAL) 20 MG tablet Take 20 mg by mouth 3 (three) times daily. 12/14/15  Yes [provider]  bisacodyl (DULCOLAX) 5 MG EC tablet Take 5 mg by mouth daily as needed for moderate constipation.   Yes [provider]  DULoxetine (CYMBALTA) 60 MG capsule Take 1 capsule (60 mg total) by mouth daily. 01/28/17  Yes Briscoe Deutscher, DO  gabapentin (NEURONTIN) 400 MG capsule Take 1 capsule (400 mg total) by mouth 4 (four) times daily. 09/27/16 04/25/17 Yes Briscoe Deutscher, DO  ibuprofen (ADVIL,MOTRIN) 200 MG tablet Place 2 tablets (400 mg total) into feeding tube every 6 (six) hours as needed for moderate pain. Patient taking differently: Place 400-600 mg into feeding tube every 6 (six) hours as needed for fever, headache, mild pain, moderate pain or cramping.  11/26/15  Yes Florencia Reasons, MD  lidocaine (LIDODERM) 5 % Place 1 patch onto the skin 2 (two) times daily as needed (pain).  04/04/15  Yes  [provider]  magnesium citrate SOLN Take 1 Bottle by mouth once.   Yes [provider]  magnesium hydroxide (MILK OF MAGNESIA) 400 MG/5ML suspension Take 5 mLs by mouth daily as needed for mild constipation. 11/10/16  Yes Briscoe Deutscher, DO  Melatonin 5 MG TABS Take 1 tablet (5 mg total) by mouth at bedtime. 11/10/16  Yes Briscoe Deutscher, DO  midodrine (PROAMATINE) 10 MG tablet PLACE 1 TABLET (10 MG TOTAL) INTO FEEDING TUBE 3 (THREE) TIMES DAILY WITH MEALS. 02/25/17  Yes Briscoe Deutscher, DO  naloxegol oxalate (MOVANTIK) 25 MG TABS tablet Take 25 mg by mouth daily.   Yes [provider]  Nutritional Supplements (FEEDING SUPPLEMENT, OSMOLITE 1.2 CAL,) LIQD Place 100 mLs into feeding tube See admin instructions. Given for 10 hours at a time.  Given between 2300 - 0900 the next day.   Yes [provider]  Nutritional Supplements (FEEDING SUPPLEMENT, OSMOLITE 1.2 CAL,) LIQD Place 1,000 mLs into feeding tube continuous. 01/28/17  Yes Briscoe Deutscher, DO  Nutritional Supplements (PROMOD) LIQD Take 30 mLs by mouth  2 (two) times daily.   Yes [provider]  nystatin (MYCOSTATIN) 100000 UNIT/ML suspension Take 5 mLs (500,000 Units total) by mouth 4 (four) times daily. Patient taking differently: Take 500,000 Units by mouth 4 (four) times daily as needed (tongue swelling).  12/09/16  Yes Nat Christen, MD  ondansetron (ZOFRAN) 4 MG tablet Take 1 tablet (4 mg total) by mouth every 8 (eight) hours as needed for nausea or vomiting. 11/10/16  Yes Briscoe Deutscher, DO  oxybutynin (DITROPAN) 5 MG tablet Take 5 mg by mouth every 8 (eight) hours as needed for bladder spasms. 07/11/16  Yes [provider]  Oxycodone HCl 10 MG TABS Take 10 mg by mouth 4 (four) times daily as needed (for pain).    Yes [provider]  OXYGEN Inhale 3 L into the lungs continuous.   Yes [provider]  polyethylene glycol powder (GLYCOLAX/MIRALAX) powder Take 17 g by mouth  daily. 11/10/16  Yes Briscoe Deutscher, DO  Skin Protectants, Misc. (BALMEX SKIN PROTECTANT EX) Apply 1 application topically 3 (three) times daily as needed (redness/irritation).    Yes [provider]  sulfamethoxazole-trimethoprim (BACTRIM DS,SEPTRA DS) 800-160 MG tablet Take 1 tablet by mouth 2 (two) times daily. 03/02/17  Yes [provider]  tiotropium (SPIRIVA HANDIHALER) 18 MCG inhalation capsule Place 1 capsule (18 mcg total) into inhaler and inhale daily. 02/20/16  Yes Erick Colace, NP  tiZANidine (ZANAFLEX) 4 MG tablet TAKE 1 TABLET (4 MG TOTAL) BY MOUTH 3 (THREE) TIMES DAILY. 01/27/17  Yes Briscoe Deutscher, DO  vitamin B-12 (CYANOCOBALAMIN) 1000 MCG tablet Take 1,000 mcg by mouth daily.   Yes [provider]  Water For Irrigation, Sterile (FREE WATER) SOLN Place 100 mLs into feeding tube every 8 (eight) hours. 11/26/15  Yes Florencia Reasons, MD  zolpidem (AMBIEN) 5 MG tablet Take 2 tablets (10 mg total) by mouth at bedtime as needed for sleep. 07/28/16  Yes Dahlstedt, Annie Main, MD  furosemide (LASIX) 40 MG tablet TAKE 1 TABLET BY MOUTH EVERY DAY 03/09/17   Briscoe Deutscher, DO    Physical Exam: Vitals:   03/09/17 0800 03/09/17 0900 03/09/17 0953 03/09/17 1000  BP: 99/65 (!) 92/59  (!) 89/58  Pulse: 84 93  91  Resp: 13 12  12   Temp:   99 F (37.2 C)   TempSrc:   Rectal   SpO2: 99% 100%  100%      Constitutional: NAD, calm, comfortable; chronic frail appearing Vitals:   03/09/17 0800 03/09/17 0900 03/09/17 0953 03/09/17 1000  BP: 99/65 (!) 92/59  (!) 89/58  Pulse: 84 93  91  Resp: 13 12  12   Temp:   99 F (37.2 C)   TempSrc:   Rectal   SpO2: 99% 100%  100%   Eyes: PERRL, lids and conjunctivae normal ENMT: Mucous membranes are moist. Posterior pharynx clear of any exudate or lesions.Normal dentition. Trach in place  Neck: normal, supple, no masses, no thyromegaly Respiratory: diminished BS at the bases  Cardiovascular: Regular rate and rhythm, no murmurs / rubs  / gallops. No extremity edema. 2+ pedal pulses. No carotid bruits.  Abdomen: no tenderness, no masses palpated. No hepatosplenomegaly. Bowel sounds positive.  Musculoskeletal: no clubbing / cyanosis. No joint deformity upper and lower extremities. Good ROM, no contractures. Normal muscle tone.  Skin: no rashes, lesions, ulcers. No induration Neurologic: CN 2-12 grossly intact.  Psychiatric: Normal judgment and insight. Alert and oriented x 3. Normal mood.     Labs on  Admission: I have personally reviewed following labs and imaging studies  CBC: Recent Labs  Lab 03/09/17 0654  WBC 16.1*  NEUTROABS 13.8*  HGB 12.1  HCT 36.2  MCV 98.9  PLT 326   Basic Metabolic Panel: Recent Labs  Lab 03/09/17 0654  NA 139  K 3.0*  CL 91*  CO2 33*  GLUCOSE 210*  BUN 22*  CREATININE 0.86  CALCIUM 9.9   GFR: CrCl cannot be calculated (Unknown ideal weight.). Liver Function Tests: Recent Labs  Lab 03/09/17 0654  AST 63*  ALT 53  ALKPHOS 78  BILITOT 0.2*  PROT 7.8  ALBUMIN 3.4*   Recent Labs  Lab 03/09/17 0654  LIPASE 22   No results for input(s): AMMONIA in the last 168 hours. Coagulation Profile: No results for input(s): INR, PROTIME in the last 168 hours. Cardiac Enzymes: No results for input(s): CKTOTAL, CKMB, CKMBINDEX, TROPONINI in the last 168 hours. BNP (last 3 results) No results for input(s): PROBNP in the last 8760 hours. HbA1C: No results for input(s): HGBA1C in the last 72 hours. CBG: No results for input(s): GLUCAP in the last 168 hours. Lipid Profile: No results for input(s): CHOL, HDL, LDLCALC, TRIG, CHOLHDL, LDLDIRECT in the last 72 hours. Thyroid Function Tests: No results for input(s): TSH, T4TOTAL, FREET4, T3FREE, THYROIDAB in the last 72 hours. Anemia Panel: No results for input(s): VITAMINB12, FOLATE, FERRITIN, TIBC, IRON, RETICCTPCT in the last 72 hours. Urine analysis:    Component Value Date/Time   COLORURINE YELLOW 03/09/2017 0822    APPEARANCEUR CLOUDY (A) 03/09/2017 0822   LABSPEC 1.018 03/09/2017 0822   PHURINE 6.0 03/09/2017 0822   GLUCOSEU NEGATIVE 03/09/2017 0822   HGBUR LARGE (A) 03/09/2017 0822   BILIRUBINUR NEGATIVE 03/09/2017 0822   KETONESUR NEGATIVE 03/09/2017 0822   PROTEINUR 100 (A) 03/09/2017 0822   UROBILINOGEN 0.2 10/29/2014 1521   NITRITE NEGATIVE 03/09/2017 0822   LEUKOCYTESUR LARGE (A) 03/09/2017 0822   Sepsis Labs: !!!!!!!!!!!!!!!!!!!!!!!!!!!!!!!!!!!!!!!!!!!! @LABRCNTIP (procalcitonin:4,lacticidven:4) )No results found for this or any previous visit (from the past 240 hour(s)).   Radiological Exams on Admission: Dg Chest 2 View  Result Date: 03/09/2017 CLINICAL DATA:  Abdominal tightness EXAM: CHEST  2 VIEW COMPARISON:  10/17/2016 FINDINGS: Tracheostomy in good position. Dual lead pacemaker unchanged. Negative for heart failure. Mild bibasilar airspace disease unchanged. Some of this may be scarring or atelectasis. Small pleural effusions bilaterally IMPRESSION: Mild bibasilar airspace disease unchanged. Probable atelectasis or scarring. Small bilateral effusions. Electronically Signed   By: Franchot Gallo M.D.   On: 03/09/2017 09:09   Ct Abdomen Pelvis W Contrast  Result Date: 03/09/2017 CLINICAL DATA:  Abdominal pain EXAM: CT ABDOMEN AND PELVIS WITH CONTRAST TECHNIQUE: Multidetector CT imaging of the abdomen and pelvis was performed using the standard protocol following bolus administration of intravenous contrast. CONTRAST:  <See Chart> ISOVUE-300 IOPAMIDOL (ISOVUE-300) INJECTION 61% COMPARISON:  11/12/2015 FINDINGS: Lower chest: Consolidation in the right lower lobe concerning for pneumonia. No effusions. Linear areas of atelectasis or scarring in the left lung base. Hepatobiliary: Geographic fatty infiltration of the liver. No focal abnormality. Gallbladder unremarkable. Pancreas: No focal abnormality or ductal dilatation. Spleen: No focal abnormality.  Normal size. Adrenals/Urinary Tract: 9 mm  proximal right ureteral stone with mild hydronephrosis. Bilateral nonobstructing renal stones. No hydronephrosis on the left. No adrenal mass. Foley catheter present in the bladder which is decompressed. Stomach/Bowel: Gastrostomy tube in the stomach. No evidence of bowel obstruction. Vascular/Lymphatic: Diffuse aortic and iliac calcifications. No evidence of aneurysm or adenopathy. Reproductive: Uterus  and adnexa unremarkable.  No mass. Other: No free fluid or free air. Musculoskeletal: No acute bony abnormality. IMPRESSION: 9 mm proximal right ureteral stone with mild right hydronephrosis. Bilateral nephrolithiasis. Geographic fatty infiltration of the liver. Aortic atherosclerosis. Consolidation in the right lower lobe concerning for pneumonia. Electronically Signed   By: Rolm Baptise M.D.   On: 03/09/2017 09:21    EKG: Independently reviewed. NSR  Assessment/Plan Active Problems:   UTI (urinary tract infection)    Urinary tract infection Bilateral renal stones with right-sided hydronephrosis -Admit the patient to the stepdown unit due to borderline soft blood pressure.  This could be chronic but concerns of sepsis as well - Received 1.5L of normal saline in the ER; will continue IV fluids and hold Lasix - Cultures to be drawn in the ER, orders placed - Started on IV cefepime and azithromycin -Urology contacted by the ED provider, plans for taking the patient to the OR later today -Maintain n.p.o. - IV pain medications as needed, can restart oral once we restart her oral diet -Continue to provide supportive care -On Midodrine due to chronic hypotension.  We will continue when we restarted all medications  Right lower lobe pneumonia Daily tobacco use -Patient is on trach collar.  Will order nebulizer treatments standing and as needed -Patient is already on cefepime, azithromycin has been added to cover atypicals -Counseled to quit smoking, nicotine patch as needed - Uses 3 L continuous  oxygen at home, will continue this for now.  If necessary we will increase it -Mucinex as needed  Hypokalemia -We will replete electrolytes  Quadriplegia PEG tube in place -We will consult nutrition team  Chronic pain Peripheral neuropathy -Holding oral p.o. 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  If necessary will consult wound care team  DVT prophylaxis: Hep SQ post Op starting tomorrow.  Code Status: Full  Family Communication: Caregiver at bedside  Disposition Plan:  TBD Consults called: Urology called in ED Admission status: Inpatient Stepdown due to borderline low BP   Ankit Arsenio Loader MD [facility] Pager [phone]  If 7PM-7AM, please contact night-coverage [email or web address] Password [email or web address]  03/09/2017, 11:40 AM

## 2017-03-09 NOTE — Progress Notes (Signed)
Pharmacy Antibiotic Note  Angela Page is a 47 y.o. female admitted on 03/09/2017 with sepsis.  Pharmacy has been consulted for cefepime dosing.  Plan: Cefepime 2g IV q12     Temp (24hrs), Avg:98.6 F (37 C), Min:98.1 F (36.7 C), Max:99 F (37.2 C)  Recent Labs  Lab 03/09/17 0654 03/09/17 1025  WBC 16.1*  --   CREATININE 0.86  --   LATICACIDVEN  --  2.14*    CrCl cannot be calculated (Unknown ideal weight.).    Allergies  Allergen Reactions  . Erythromycin Nausea And Vomiting  . Nitrofurantoin Monohyd Macro Nausea And Vomiting  . Penicillins Hives    Has patient had a PCN reaction causing immediate rash, facial/tongue/throat swelling, SOB or lightheadedness with hypotension: Yes Has patient had a PCN reaction causing severe rash involving mucus membranes or skin necrosis: Yes Has patient had a PCN reaction that required hospitalization No Has patient had a PCN reaction occurring within the last 10 years: No-childhood allergy If all of the above answers are "NO", then may proceed with Cephalosporin      Thank you for allowing pharmacy to be a part of this patient's care.  Kara Mead 03/09/2017 11:39 AM

## 2017-03-09 NOTE — Telephone Encounter (Signed)
After hours service received a call from pt's [facility] nurse stating pt is on tube feeds however has not been able to tolerate x 2 days 2/2 N/V, requesting orders.  After hours agent advised pt needs to be evaluated if having issues x 2 days.  Advised to go to the ED.   Grier Mitts, MD

## 2017-03-09 NOTE — ED Notes (Signed)
Dr. Reesa Chew states that pt is not to get any other medications PO or through the tube.

## 2017-03-09 NOTE — Care Management Note (Signed)
Case Management Note  Patient Details  Name: Angela Page MRN: [id number] Date of Birth: 03-Sep-1970  Pt is active with [facility].  Pt receives private duty RN 16 hours/day x 7 days a week.  Expected Discharge Date:    Unknown              Expected Discharge Plan:  [place] Choice:  Home Health Choice offered to:  Patient, Parent  HH Arranged:  RN, PCS/Personal Care [facility] Agency:  [place]  Status of Service:  In process, will continue to follow  Rae Mar, RN 03/09/2017, 1:13 PM

## 2017-03-09 NOTE — ED Notes (Signed)
Pt has poor vascular access.  Charge RN to attempt to [name] IV and to grab blood cultures.

## 2017-03-09 NOTE — Consult Note (Signed)
Subjective: CC: Right ureteral stone.  Hx:  I was asked to see Angela Page  by Dr. Deno Etienne for a 19mm right proximal with obstruction seen on CT today and a UTI.  She is a 47 y.o. female with medical history significant for quadriplegia with a NGB and urinary retention status post suprapubic catheter placement in 8/18 by Dr. Diona Fanti.  She presented today with complaints of bilateral abdominal pain for the last few days.  The pain can be intense and is associated with N/V.   She has not had flank pain but has had fever.  Her WBC is elevated and her lactate is 2.3.  She has chronic hypotension but is not in distress at this time.  She has had prior UTI's and  she  had a renal stone back in 2016 requiring surgical intervention.   ROS:  Review of Systems  Constitutional: Positive for fever.  Respiratory: Positive for shortness of breath.   Cardiovascular: Positive for chest pain.  Gastrointestinal: Positive for abdominal pain.  Genitourinary: Negative for flank pain.  Skin:       She reports a sacral decubitus that was recently biopsied and she was started on septra for osteomyelitis.  She has developed a fine diffuse rash since starting the septra.   Neurological: Positive for focal weakness (quadriplegia. ).  All other systems reviewed and are negative.   Allergies  Allergen Reactions  . Erythromycin Nausea And Vomiting  . Nitrofurantoin Monohyd Macro Nausea And Vomiting  . Penicillins Hives    Has patient had a PCN reaction causing immediate rash, facial/tongue/throat swelling, SOB or lightheadedness with hypotension: Yes Has patient had a PCN reaction causing severe rash involving mucus membranes or skin necrosis: Yes Has patient had a PCN reaction that required hospitalization No Has patient had a PCN reaction occurring within the last 10 years: No-childhood allergy If all of the above answers are "NO", then may proceed with Cephalosporin     Past Medical History:   Diagnosis Date  . Anasarca 06/10/2016  . Asthma   . Cocaine use 10/08/2013  . GERD (gastroesophageal reflux disease)   . HCAP (healthcare-associated pneumonia) 06/09/2016  . Overdose of opiate or related narcotic ([place]) 09/02/2014  . Pacemaker   . Pleurisy   . Polysubstance dependence including opioid type drug, episodic abuse ([place]) 10/27/2013  . Protein calorie malnutrition ([place]) 10/31/2015  . Quadriparesis ([place])   . Quadriplegia, C5-C7 incomplete ([place]) 10/08/2013  . Stage IV pressure ulcer of sacral region ([place]) 10/31/2015    Past Surgical History:  Procedure Laterality Date  . APPENDECTOMY    . BACK SURGERY  10/08/2013   fusion of spine, cervical region  . CARDIAC SURGERY    . GASTROSTOMY  11/23/2015  . I&D EXTREMITY  10/28/2011   Procedure: IRRIGATION AND DEBRIDEMENT EXTREMITY;  Surgeon: Tennis Must, MD;  Location: [facility];  Service: Orthopedics;  Laterality: Right;  Irrigation and debridement right middle finger  . INSERTION OF SUPRAPUBIC CATHETER N/A 07/28/2016   Procedure: INSERTION OF SUPRAPUBIC CATHETER;  Surgeon: Franchot Gallo, MD;  Location: [facility];  Service: Urology;  Laterality: N/A;  . IR GENERIC HISTORICAL  11/23/2015   IR GASTROSTOMY TUBE MOD SED 11/23/2015 [facility]  . TRACHEOSTOMY      Social History   Socioeconomic History  . Marital status: Divorced    Spouse name: Not on file  . Number of children: Not on file  . Years of education: Not on file  .  Highest education level: Not on file  Social Needs  . Financial resource strain: Not on file  . Food insecurity - worry: Not on file  . Food insecurity - inability: Not on file  . Transportation needs - medical: Not on file  . Transportation needs - non-medical: Not on file  Occupational History  . Not on file  Tobacco Use  . Smoking status: Current Every Day Smoker    Packs/day: 1.00    Years: 25.00    Pack years: 25.00    Types: Cigarettes  . Smokeless tobacco: Never Used   Substance and Sexual Activity  . Alcohol use: Yes    Comment: 2 x weekly  . Drug use: No  . Sexual activity: No    Birth control/protection: None  Other Topics Concern  . Not on file  Social History Narrative  . Not on file    Family History  Problem Relation Age of Onset  . Hypertension Maternal Uncle   . Kidney failure Maternal Uncle   . Colon cancer Mother   . Lung cancer Father   . Hypertension Brother   . Kidney failure Maternal Aunt   . Kidney failure Maternal Uncle     Anti-infectives: Anti-infectives (From admission, onward)   Start     Dose/Rate Route Frequency Ordered Stop   03/10/17 1200  azithromycin (ZITHROMAX) 500 mg in sodium chloride 0.9 % 250 mL IVPB     500 mg 250 mL/hr over 60 Minutes Intravenous Every 24 hours 03/09/17 1132     03/10/17 0000  ceFEPIme (MAXIPIME) 2 g in sodium chloride 0.9 % 100 mL IVPB     2 g 200 mL/hr over 30 Minutes Intravenous Every 12 hours 03/09/17 1142     03/09/17 1145  sulfamethoxazole-trimethoprim (BACTRIM DS,SEPTRA DS) 800-160 MG per tablet 1 tablet  Status:  Discontinued     1 tablet Oral 2 times daily 03/09/17 1132 03/09/17 1143   03/09/17 1045  ceFEPIme (MAXIPIME) 2 g in sodium chloride 0.9 % 100 mL IVPB     2 g 200 mL/hr over 30 Minutes Intravenous  Once 03/09/17 1032 03/09/17 1214   03/09/17 1045  azithromycin (ZITHROMAX) 500 mg in sodium chloride 0.9 % 250 mL IVPB     500 mg 250 mL/hr over 60 Minutes Intravenous  Once 03/09/17 1032        Current Facility-Administered Medications  Medication Dose Route Frequency Provider Last Rate Last Dose  . 0.9 %  sodium chloride infusion   Intravenous Continuous Amin, Ankit Chirag, MD      . acetaminophen (TYLENOL) tablet 650 mg  650 mg Oral Q6H PRN Amin, Ankit Chirag, MD       Or  . acetaminophen (TYLENOL) suppository 650 mg  650 mg Rectal Q6H PRN Amin, Ankit Chirag, MD      . alum & mag hydroxide-simeth (MAALOX/MYLANTA) 200-200-20 MG/5ML suspension 30 mL  30 mL Oral Q4H PRN  Amin, Ankit Chirag, MD      . azithromycin (ZITHROMAX) 500 mg in sodium chloride 0.9 % 250 mL IVPB  500 mg Intravenous Once Leaphart, Kenneth T, PA-C      . [START ON 03/10/2017] azithromycin (ZITHROMAX) 500 mg in sodium chloride 0.9 % 250 mL IVPB  500 mg Intravenous Q24H Amin, Ankit Chirag, MD      . baclofen (LIORESAL) tablet 20 mg  20 mg Oral TID Amin, Ankit Chirag, MD      . bisacodyl (DULCOLAX) EC tablet 5 mg  5 mg Oral  Daily PRN Amin, Jeanella Flattery, MD      . bisacodyl (DULCOLAX) EC tablet 5 mg  5 mg Oral Daily PRN Amin, Jeanella Flattery, MD      . Derrill Memo ON 03/10/2017] ceFEPIme (MAXIPIME) 2 g in sodium chloride 0.9 % 100 mL IVPB  2 g Intravenous Q12H Adrian Saran, RPH      . dextromethorphan-guaiFENesin (Edisto DM) 30-600 MG per 12 hr tablet 1 tablet  1 tablet Oral BID PRN Amin, Ankit Chirag, MD      . DULoxetine (CYMBALTA) DR capsule 60 mg  60 mg Oral Daily Amin, Ankit Chirag, MD      . feeding supplement (OSMOLITE 1.2 CAL) liquid 1,000 mL  1,000 mL Per Tube Continuous Amin, Ankit Chirag, MD      . feeding supplement (OSMOLITE 1.2 CAL) liquid 100 mL  100 mL Per Tube See admin instructions Amin, Ankit Chirag, MD      . free water 100 mL  100 mL Per Tube Q8H Amin, Ankit Chirag, MD      . gabapentin (NEURONTIN) capsule 400 mg  400 mg Oral QID Amin, Ankit Chirag, MD      . Derrill Memo ON 03/10/2017] heparin injection 5,000 Units  5,000 Units Subcutaneous Q8H Amin, Ankit Chirag, MD      . hydrocortisone (ANUSOL-HC) 2.5 % rectal cream 1 application  1 application Topical QID PRN Amin, Ankit Chirag, MD      . hydrocortisone cream 1 % 1 application  1 application Topical TID PRN Amin, Ankit Chirag, MD      . insulin aspart (novoLOG) injection 0-5 Units  0-5 Units Subcutaneous QHS Amin, Ankit Chirag, MD      . insulin aspart (novoLOG) injection 0-9 Units  0-9 Units Subcutaneous TID WC Amin, Ankit Chirag, MD      . ipratropium-albuterol (DUONEB) 0.5-2.5 (3) MG/3ML nebulizer solution 3 mL  3 mL Nebulization  Q6H Amin, Ankit Chirag, MD      . ipratropium-albuterol (DUONEB) 0.5-2.5 (3) MG/3ML nebulizer solution 3 mL  3 mL Nebulization Q2H PRN Amin, Ankit Chirag, MD      . lip balm (CARMEX) ointment 1 application  1 application Topical PRN Amin, Ankit Chirag, MD      . loratadine (CLARITIN) tablet 10 mg  10 mg Oral Daily PRN Amin, Ankit Chirag, MD      . magnesium citrate solution 1 Bottle  1 Bottle Oral Once Amin, Ankit Chirag, MD      . magnesium hydroxide (MILK OF MAGNESIA) suspension 5 mL  5 mL Oral Daily PRN Amin, Ankit Chirag, MD      . Melatonin TABS 5 mg  5 mg Oral QHS Amin, Ankit Chirag, MD      . midodrine (PROAMATINE) tablet 10 mg  10 mg Per Tube TID WC Amin, Ankit Chirag, MD      . morphine 2 MG/ML injection 2 mg  2 mg Intravenous Q3H PRN Amin, Ankit Chirag, MD      . MUSCLE RUB CREA 1 application  1 application Topical PRN Amin, Ankit Chirag, MD      . naloxegol oxalate (MOVANTIK) tablet 25 mg  25 mg Oral Daily Amin, Ankit Chirag, MD      . ondansetron (ZOFRAN) tablet 4 mg  4 mg Oral Q6H PRN Amin, Ankit Chirag, MD       Or  . ondansetron (ZOFRAN) injection 4 mg  4 mg Intravenous Q6H PRN Amin, Ankit Chirag, MD      . oxybutynin ([place])  tablet 5 mg  5 mg Oral Q8H PRN Amin, Ankit Chirag, MD      . Oxycodone HCl TABS 10 mg  10 mg Oral QID PRN Amin, Ankit Chirag, MD      . phenol (CHLORASEPTIC) mouth spray 1 spray  1 spray Mouth/Throat PRN Amin, Ankit Chirag, MD      . polyethylene glycol powder (GLYCOLAX/MIRALAX) container 17 g  17 g Oral Daily Amin, Ankit Chirag, MD      . polyvinyl alcohol (LIQUIFILM TEARS) 1.4 % ophthalmic solution 1 drop  1 drop Both Eyes PRN Amin, Ankit Chirag, MD      . potassium chloride 10 mEq in 100 mL IVPB  10 mEq Intravenous Q1 Hr x 4 Amin, Ankit Chirag, MD      . PROMOD LIQD 30 mL  30 mL Oral BID Amin, Ankit Chirag, MD      . senna-docusate (Senokot-S) tablet 1 tablet  1 tablet Oral QHS PRN Amin, Ankit Chirag, MD      . sodium chloride (OCEAN) 0.65 % nasal  spray 1 spray  1 spray Each Nare PRN Amin, Ankit Chirag, MD      . tiZANidine (ZANAFLEX) tablet 4 mg  4 mg Oral TID Amin, Ankit Chirag, MD      . vitamin B-12 (CYANOCOBALAMIN) tablet 1,000 mcg  1,000 mcg Oral Daily Amin, Ankit Chirag, MD      . zolpidem (AMBIEN) tablet 10 mg  10 mg Oral QHS PRN Amin, Jeanella Flattery, MD       Current Outpatient Medications  Medication Sig Dispense Refill  . acetaminophen (TYLENOL) 500 MG tablet Take 1,000 mg by mouth every 6 (six) hours as needed for mild pain, moderate pain, fever or headache.     . albuterol (PROVENTIL HFA;VENTOLIN HFA) 108 (90 Base) MCG/ACT inhaler Inhale 1-2 puffs into the lungs every 6 (six) hours as needed for wheezing or shortness of breath.    Marland Kitchen albuterol (PROVENTIL) (2.5 MG/3ML) 0.083% nebulizer solution Take 2.5 mg by nebulization every 6 (six) hours as needed for wheezing or shortness of breath.    . baclofen (LIORESAL) 20 MG tablet Take 20 mg by mouth 3 (three) times daily.  3  . bisacodyl (DULCOLAX) 5 MG EC tablet Take 5 mg by mouth daily as needed for moderate constipation.    . DULoxetine (CYMBALTA) 60 MG capsule Take 1 capsule (60 mg total) by mouth daily. 30 capsule 3  . gabapentin (NEURONTIN) 400 MG capsule Take 1 capsule (400 mg total) by mouth 4 (four) times daily. 120 capsule 6  . ibuprofen (ADVIL,MOTRIN) 200 MG tablet Place 2 tablets (400 mg total) into feeding tube every 6 (six) hours as needed for moderate pain. (Patient taking differently: Place 400-600 mg into feeding tube every 6 (six) hours as needed for fever, headache, mild pain, moderate pain or cramping. ) 30 tablet 0  . lidocaine (LIDODERM) 5 % Place 1 patch onto the skin 2 (two) times daily as needed (pain).   0  . magnesium citrate SOLN Take 1 Bottle by mouth once.    . magnesium hydroxide (MILK OF MAGNESIA) 400 MG/5ML suspension Take 5 mLs by mouth daily as needed for mild constipation. 360 mL 0  . Melatonin 5 MG TABS Take 1 tablet (5 mg total) by mouth at bedtime.  30 tablet 5  . midodrine (PROAMATINE) 10 MG tablet PLACE 1 TABLET (10 MG TOTAL) INTO FEEDING TUBE 3 (THREE) TIMES DAILY WITH MEALS. 90 tablet 1  . naloxegol oxalate (  MOVANTIK) 25 MG TABS tablet Take 25 mg by mouth daily.    . Nutritional Supplements (FEEDING SUPPLEMENT, OSMOLITE 1.2 CAL,) LIQD Place 100 mLs into feeding tube See admin instructions. Given for 10 hours at a time.  Given between 2300 - 0900 the next day.    . Nutritional Supplements (FEEDING SUPPLEMENT, OSMOLITE 1.2 CAL,) LIQD Place 1,000 mLs into feeding tube continuous. 1000 mL 12  . Nutritional Supplements (PROMOD) LIQD Take 30 mLs by mouth 2 (two) times daily.    Marland Kitchen nystatin (MYCOSTATIN) 100000 UNIT/ML suspension Take 5 mLs (500,000 Units total) by mouth 4 (four) times daily. (Patient taking differently: Take 500,000 Units by mouth 4 (four) times daily as needed (tongue swelling). ) 60 mL 1  . ondansetron (ZOFRAN) 4 MG tablet Take 1 tablet (4 mg total) by mouth every 8 (eight) hours as needed for nausea or vomiting. 20 tablet 5  . oxybutynin (DITROPAN) 5 MG tablet Take 5 mg by mouth every 8 (eight) hours as needed for bladder spasms.  11  . Oxycodone HCl 10 MG TABS Take 10 mg by mouth 4 (four) times daily as needed (for pain).     . OXYGEN Inhale 3 L into the lungs continuous.    . polyethylene glycol powder (GLYCOLAX/MIRALAX) powder Take 17 g by mouth daily. 3350 g 1  . Skin Protectants, Misc. (BALMEX SKIN PROTECTANT EX) Apply 1 application topically 3 (three) times daily as needed (redness/irritation).     Marland Kitchen sulfamethoxazole-trimethoprim (BACTRIM DS,SEPTRA DS) 800-160 MG tablet Take 1 tablet by mouth 2 (two) times daily.  1  . tiotropium (SPIRIVA HANDIHALER) 18 MCG inhalation capsule Place 1 capsule (18 mcg total) into inhaler and inhale daily. 30 capsule 12  . tiZANidine (ZANAFLEX) 4 MG tablet TAKE 1 TABLET (4 MG TOTAL) BY MOUTH 3 (THREE) TIMES DAILY. 90 tablet 3  . vitamin B-12 (CYANOCOBALAMIN) 1000 MCG tablet Take 1,000 mcg by  mouth daily.    . Water For Irrigation, Sterile (FREE WATER) SOLN Place 100 mLs into feeding tube every 8 (eight) hours. 1000 mL 0  . zolpidem (AMBIEN) 5 MG tablet Take 2 tablets (10 mg total) by mouth at bedtime as needed for sleep. 20 tablet 0  . furosemide (LASIX) 40 MG tablet TAKE 1 TABLET BY MOUTH EVERY DAY 30 tablet 3     Objective: Vital signs in last 24 hours: Temp:  [98.1 F (36.7 C)-99 F (37.2 C)] 99 F (37.2 C) (02/18 0953) Pulse Rate:  [84-93] 90 (02/18 1200) Resp:  [12-16] 12 (02/18 1200) BP: (82-99)/(42-65) 95/56 (02/18 1200) SpO2:  [98 %-100 %] 100 % (02/18 1200)  Intake/Output from previous day: No intake/output data recorded. Intake/Output this shift: No intake/output data recorded.   Physical Exam  Constitutional: She is oriented to person, place, and time.  WD, WN, WF with spastic quadriplegia.    HENT:  Head: Normocephalic and atraumatic.  Neck: Normal range of motion. Neck supple.  Tracheostomy in place but plugged.   Cardiovascular: Normal rate and regular rhythm.  Pulmonary/Chest: Effort normal and breath sounds normal. No respiratory distress.  Abdominal: Soft. Bowel sounds are normal. She exhibits distension. She exhibits no mass. There is no tenderness.  Musculoskeletal: She exhibits no edema or tenderness.  Neurological: She is alert and oriented to person, place, and time.  Quadriplegia.   Skin: Skin is warm and dry. Rash (fine diffuse rash. ) noted.  Psychiatric: Affect normal.  Vitals reviewed.   Lab Results:  Recent Labs    03/09/17  0654  WBC 16.1*  HGB 12.1  HCT 36.2  PLT 321   BMET Recent Labs    03/09/17 0654  NA 139  K 3.0*  CL 91*  CO2 33*  GLUCOSE 210*  BUN 22*  CREATININE 0.86  CALCIUM 9.9   PT/INR No results for input(s): LABPROT, INR in the last 72 hours. ABG No results for input(s): PHART, HCO3 in the last 72 hours.  Invalid input(s): PCO2, PO2  Studies/Results: Dg Chest 2 View  Result Date:  03/09/2017 CLINICAL DATA:  Abdominal tightness EXAM: CHEST  2 VIEW COMPARISON:  10/17/2016 FINDINGS: Tracheostomy in good position. Dual lead pacemaker unchanged. Negative for heart failure. Mild bibasilar airspace disease unchanged. Some of this may be scarring or atelectasis. Small pleural effusions bilaterally IMPRESSION: Mild bibasilar airspace disease unchanged. Probable atelectasis or scarring. Small bilateral effusions. Electronically Signed   By: Franchot Gallo M.D.   On: 03/09/2017 09:09   Ct Abdomen Pelvis W Contrast  Result Date: 03/09/2017 CLINICAL DATA:  Abdominal pain EXAM: CT ABDOMEN AND PELVIS WITH CONTRAST TECHNIQUE: Multidetector CT imaging of the abdomen and pelvis was performed using the standard protocol following bolus administration of intravenous contrast. CONTRAST:  <See Chart> ISOVUE-300 IOPAMIDOL (ISOVUE-300) INJECTION 61% COMPARISON:  11/12/2015 FINDINGS: Lower chest: Consolidation in the right lower lobe concerning for pneumonia. No effusions. Linear areas of atelectasis or scarring in the left lung base. Hepatobiliary: Geographic fatty infiltration of the liver. No focal abnormality. Gallbladder unremarkable. Pancreas: No focal abnormality or ductal dilatation. Spleen: No focal abnormality.  Normal size. Adrenals/Urinary Tract: 9 mm proximal right ureteral stone with mild hydronephrosis. Bilateral nonobstructing renal stones. No hydronephrosis on the left. No adrenal mass. Foley catheter present in the bladder which is decompressed. Stomach/Bowel: Gastrostomy tube in the stomach. No evidence of bowel obstruction. Vascular/Lymphatic: Diffuse aortic and iliac calcifications. No evidence of aneurysm or adenopathy. Reproductive: Uterus and adnexa unremarkable.  No mass. Other: No free fluid or free air. Musculoskeletal: No acute bony abnormality. IMPRESSION: 9 mm proximal right ureteral stone with mild right hydronephrosis. Bilateral nephrolithiasis. Geographic fatty infiltration of the  liver. Aortic atherosclerosis. Consolidation in the right lower lobe concerning for pneumonia. Electronically Signed   By: Rolm Baptise M.D.   On: 03/09/2017 09:21     Assessment: 63mm right proximal stone with obstruction and UTI.  I am going to get her set up for cystoscopy with right ureteral stenting this afternoon.  I have reviewed the risks of bleeding, infection, ureteral injury, need for secondary procedures including possible perc tube, delayed ureteroscopy or ESWL, thrombotic events and anesthetic complications.    NGB.  Continue SP drainage.   Skin rash.  She may be best served stopping the septra to see if the rash will resolve.     CC: Dr. Deno Etienne.      Irine Seal 03/09/2017 [phone]

## 2017-03-09 NOTE — Anesthesia Postprocedure Evaluation (Addendum)
Anesthesia Post Note  Patient: Angela Page  Procedure(s) Performed: Consuela Mimes Cindra Presume PLACEMENT (Right Urethra)     Patient location during evaluation: PACU Anesthesia Type: MAC Level of consciousness: awake and alert Pain management: pain level controlled Vital Signs Assessment: post-procedure vital signs reviewed and stable Respiratory status: spontaneous breathing and patient connected to tracheostomy mask oxygen Cardiovascular status: stable and blood pressure returned to baseline Postop Assessment: no apparent nausea or vomiting Anesthetic complications: no    Last Vitals:  Vitals:   03/09/17 1815 03/09/17 1830  BP: (!) 90/53 (!) 93/57  Pulse: 90 92  Resp: 12 13  Temp:    SpO2: 94% 94%                Effie Berkshire

## 2017-03-09 NOTE — ED Notes (Signed)
Dorothea Ogle, [place] made aware of pt's BP of 82/42.  Pt and pt's aid both claim that the pt's systolic BP stays in the 67J.  Pt is a/o x 4.  Will continue to monitor.

## 2017-03-09 NOTE — Anesthesia Preprocedure Evaluation (Addendum)
Anesthesia Evaluation  Patient identified by MRN, date of birth, ID band Patient awake    Reviewed: Allergy & Precautions, NPO status , Patient's Chart, lab work & pertinent test results  Airway Mallampati: Trach       Dental   Pulmonary asthma , COPD,  COPD inhaler, Current Smoker,     + decreased breath sounds      Cardiovascular +CHF  + pacemaker  Rhythm:Regular Rate:Normal     Neuro/Psych PSYCHIATRIC DISORDERS Depression    GI/Hepatic Neg liver ROS, GERD  ,  Endo/Other  negative endocrine ROS  Renal/GU negative Renal ROS     Musculoskeletal negative musculoskeletal ROS (+)   Abdominal Normal abdominal exam  (+)   Peds  Hematology   Anesthesia Other Findings - Quadriparesis - Trach  Reproductive/Obstetrics                           Lab Results  Component Value Date   WBC 16.1 (H) 03/09/2017   HGB 12.1 03/09/2017   HCT 36.2 03/09/2017   MCV 98.9 03/09/2017   PLT 321 03/09/2017   Lab Results  Component Value Date   CREATININE 0.86 03/09/2017   BUN 22 (H) 03/09/2017   NA 139 03/09/2017   K 3.0 (L) 03/09/2017   CL 91 (L) 03/09/2017   CO2 33 (H) 03/09/2017   Lab Results  Component Value Date   INR 0.92 06/23/2016   INR 1.01 06/10/2016   INR 0.87 11/21/2015   EKG: normal sinus rhythm.  Anesthesia Physical Anesthesia Plan  ASA: III  Anesthesia Plan: MAC   Post-op Pain Management:    Induction: Inhalational  PONV Risk Score and Plan: 3 and Dexamethasone, Ondansetron and Midazolam  Airway Management Planned: Tracheostomy  Additional Equipment: None  Intra-op Plan:   Post-operative Plan:   Informed Consent: I have reviewed the patients History and Physical, chart, labs and discussed the procedure including the risks, benefits and alternatives for the proposed anesthesia with the patient or authorized representative who has indicated his/her understanding and  acceptance.     Plan Discussed with: CRNA  Anesthesia Plan Comments: (Possible GA, pt consented. )      Anesthesia Quick Evaluation

## 2017-03-09 NOTE — ED Notes (Signed)
PA at bedside.

## 2017-03-09 NOTE — ED Triage Notes (Signed)
Pt BIB EMS from home with complaints of abdominal pain. Patient states that she has not had a regular bowel movement since Tuesday. Had a small BM yesterday but nothing else. Patient endorses dry heaving but no vomiting. Patient given zofran by aide at 0000. Hx of constipation.

## 2017-03-09 NOTE — ED Notes (Signed)
Pt transported to get x-ray and CT scan by CT tech.

## 2017-03-09 NOTE — Transfer of Care (Signed)
Immediate Anesthesia Transfer of Care Note  Patient: Angela Page  Procedure(s) Performed: Procedure(s): CYSTOSCOPY [name] STENT PLACEMENT (Right)  Patient Location: PACU  Anesthesia Type:MAC  Level of Consciousness:  sedated, patient cooperative and responds to stimulation  Airway & Oxygen Therapy:Patient Spontanous Breathing and Patient connected to face mask oxgen  Post-op Assessment:  Report given to PACU RN and Post -op Vital signs reviewed and stable  Post vital signs:  Reviewed and stable  Last Vitals:  Vitals:   03/09/17 1500 03/09/17 1606  BP: 97/76 (!) 83/50  Pulse: (!) 105 88  Resp: 18 14  Temp:  37.2 C  SpO2: 734% 19%    Complications: No apparent anesthesia complications

## 2017-03-09 NOTE — ED Notes (Signed)
Bed: WA20 Expected date:  Expected time:  Means of arrival:  Comments: 47 yo F/abd pain

## 2017-03-10 ENCOUNTER — Encounter (HOSPITAL_COMMUNITY): Payer: Self-pay | Admitting: Urology

## 2017-03-10 DIAGNOSIS — N201 Calculus of ureter: Secondary | ICD-10-CM | POA: Diagnosis present

## 2017-03-10 LAB — COMPREHENSIVE METABOLIC PANEL
ALK PHOS: 66 U/L (ref 38–126)
ALT: 38 U/L (ref 14–54)
ANION GAP: 12 (ref 5–15)
AST: 44 U/L — AB (ref 15–41)
Albumin: 2.9 g/dL — ABNORMAL LOW (ref 3.5–5.0)
BUN: 16 mg/dL (ref 6–20)
CALCIUM: 9.1 mg/dL (ref 8.9–10.3)
CO2: 26 mmol/L (ref 22–32)
Chloride: 103 mmol/L (ref 101–111)
Creatinine, Ser: 0.77 mg/dL (ref 0.44–1.00)
GFR calc Af Amer: >60 mL/min (ref >60)
GFR calc non Af Amer: >60 mL/min (ref >60)
GLUCOSE: 141 mg/dL — AB (ref 65–99)
Potassium: 4.3 mmol/L (ref 3.5–5.1)
SODIUM: 141 mmol/L (ref 135–145)
Total Bilirubin: 0.4 mg/dL (ref 0.3–1.2)
Total Protein: 6.6 g/dL (ref 6.5–8.1)

## 2017-03-10 LAB — GLUCOSE, CAPILLARY
GLUCOSE-CAPILLARY: 213 mg/dL — AB (ref 65–99)
GLUCOSE-CAPILLARY: 134 mg/dL — AB (ref 65–99)
GLUCOSE-CAPILLARY: 92 mg/dL (ref 65–99)
Glucose-Capillary: 103 mg/dL — ABNORMAL HIGH (ref 65–99)
Glucose-Capillary: 167 mg/dL — ABNORMAL HIGH (ref 65–99)

## 2017-03-10 LAB — CBC
HCT: 31.6 % — ABNORMAL LOW (ref 36.0–46.0)
HEMOGLOBIN: 10.3 g/dL — AB (ref 12.0–15.0)
MCH: 32.5 pg (ref 26.0–34.0)
MCHC: 32.6 g/dL (ref 30.0–36.0)
MCV: 99.7 fL (ref 78.0–100.0)
Platelets: 274 10*3/uL (ref 150–400)
RBC: 3.17 MIL/uL — ABNORMAL LOW (ref 3.87–5.11)
RDW: 16 % — ABNORMAL HIGH (ref 11.5–15.5)
WBC: 16.1 10*3/uL — ABNORMAL HIGH (ref 4.0–10.5)

## 2017-03-10 LAB — URINE CULTURE

## 2017-03-10 LAB — PROTIME-INR
INR: 1.04
PROTHROMBIN TIME: 13.6 s (ref 11.4–15.2)

## 2017-03-10 LAB — APTT: aPTT: 28 seconds (ref 24–36)

## 2017-03-10 MED ORDER — VANCOMYCIN HCL 10 G IV SOLR
1250.0000 mg | INTRAVENOUS | Status: DC
  Administered 2017-03-10 – 2017-03-11 (×2): 1250 mg via INTRAVENOUS
  Filled 2017-03-10 (×2): qty 1250

## 2017-03-10 MED ORDER — SODIUM CHLORIDE 0.9 % IV BOLUS (SEPSIS)
1000.0000 mL | INTRAVENOUS | Status: DC | PRN
  Administered 2017-03-10 (×2): 1000 mL via INTRAVENOUS

## 2017-03-10 MED ORDER — VANCOMYCIN HCL IN DEXTROSE 750-5 MG/150ML-% IV SOLN: 750.0000 mg | Freq: Two times a day (BID) | INTRAVENOUS | Status: DC

## 2017-03-10 MED ORDER — VANCOMYCIN HCL 10 G IV SOLR
1250.0000 mg | Freq: Once | INTRAVENOUS | Status: AC
  Administered 2017-03-10: 1250 mg via INTRAVENOUS
  Filled 2017-03-10: qty 1250

## 2017-03-10 MED ORDER — OSMOLITE 1.2 CAL PO LIQD
1000.0000 mL | ORAL | Status: AC
  Administered 2017-03-10: 1000 mL

## 2017-03-10 MED ORDER — MEROPENEM 1 G IV SOLR
1.0000 g | Freq: Three times a day (TID) | INTRAVENOUS | Status: DC
  Administered 2017-03-10 – 2017-03-12 (×6): 1 g via INTRAVENOUS
  Filled 2017-03-10 (×7): qty 1

## 2017-03-10 MED ORDER — SODIUM CHLORIDE 0.9 % IV BOLUS (SEPSIS)
1000.0000 mL | Freq: Once | INTRAVENOUS | Status: AC
  Administered 2017-03-10: 1000 mL via INTRAVENOUS

## 2017-03-10 MED ORDER — PRO-STAT SUGAR FREE PO LIQD
30.0000 mL | Freq: Three times a day (TID) | ORAL | Status: DC
  Administered 2017-03-10 – 2017-03-12 (×6): 30 mL via ORAL
  Filled 2017-03-10 (×6): qty 30

## 2017-03-10 NOTE — Progress Notes (Signed)
Pt seemingly stable from GU standpoint. No new recommendations at this point. Will continue to follow and see on 2/20.

## 2017-03-10 NOTE — Consult Note (Signed)
[place] Nurse wound consult note Reason for Consult:Patient well known to the [facility] Nursing team.  Last seen by this writer on 06/30/16. 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 Patient is currently using hydrofera blue, an antimicrobial and absorbent dressing that we do not carry here in the hospital.  She is aware of this and is in agreement with [name] using a silver dressing from the same category to dressing her wounds while she is in house. Wound type:Pressure, moisture Pressure Injury POA: Yes Measurement: Sacrum:Two open areas in the sacral defect, both near the crevice and with a DPTI-like purple/maroon discoloration in the center of these.  The total areas measures 3cm x 4cm with a proximal ulcer measuring 0.4cm x 0.6cm x 0.1cm, a distal wound measuring 0.4cm x 0.2cm x 0.1cm and a DTPI measuring 2cm x 0.4cm. Bilateral ischial tuberosities are closed. Bilateral heels are very slow to blanch, but intact.  I will provide pressure redistribution heel boots today. Wound bed:As described above Drainage (amount, consistency, odor) scant drainage, but perspiration and intertriginous skin folds create moisture. Periwound: intact with evidence of wound healing Dressing procedure/placement/frequency:Patent is on a low air loss therapeutic surface while in ICU, I will continue this upon transfer to floor.  We will provide boots as mentioned above.  Turning side to side is the cornerstone of the POC.  Dressing orders using silver hydrofiber are provided.  [place] nursing team will not follow, but will remain available to this patient, the nursing and medical teams.  Please re-consult if needed. Thanks, Maudie Flakes, MSN, RN, [place], Arther Abbott  Pager# [phone]

## 2017-03-10 NOTE — Progress Notes (Signed)
Initial Nutrition Assessment  INTERVENTION:   2/19: Osmolite 1.2 @ 50 ml/hr x 10 hours (runs 2300-0900). Increase 30 ml Prostat to TID Continue free water flushes 100 ml every 8 hours (600 ml) This provides 900 kcal, 72g protein and 1010 ml H2O.  Goal rate: Osmolite 1.2 @ 100 ml/hr x 10 hours (2300-0900) 30 ml Prostat BID + Free water flushes 100 ml every 8 hours Provides 1400 kcal, 85g protein and 1420 ml H2O.   -Changed diet to Mechanical soft  NUTRITION DIAGNOSIS:   Increased nutrient needs related to wound healing as evidenced by estimated needs.  GOAL:   Patient will meet greater than or equal to 90% of their needs  MONITOR:   PO intake, Weight trends, Labs, TF tolerance, Skin, I & O's  REASON FOR ASSESSMENT:   Consult Assessment of nutrition requirement/status, Enteral/tube feeding initiation and management  ASSESSMENT:    47 y.o. 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    2/18: s/p Cystoscopy with insertion of right double-J stent.  Pt in room with RN at bedside. Pt reports home TF regimen of Osmolite 1.2 @ 100 ml/hr x 10 hours (runs from 2300-0900). Pt states she has recently started refusing her TF as she is constipated. Last BM was on 2/13 (6 days ago). Pt reports tolerating prior to recent episode of constipation.  Last night Osmolite 1.2 ran at 10 ml/hr x 10 hours (providing 120 kcal and 5g protein). Receiving Prostat BID beginning today. Pt does not feel comfortable receiving TF at goal rate tonight unless she has a BM. Will monitor for BM. If does not have BM, will run at 50 ml/hr for 10 hours.   Pt consumes softer consistency foods PO at home such as pastas. Will adjust diet to mechanical soft per patient request and history of dysphagia. SLP to evaluate pt as  well.  Per chart review, pt has gained weight.  Medications: Free water flushes 100 ml every 8 hours, Miralax packet daily, Vitamin B-12 tablet daily Labs reviewed: CBGs: 103-213  NUTRITION - FOCUSED PHYSICAL EXAM:  Nutrition focused physical exam shows no sign of depletion of muscle mass or body fat.  Diet Order:  Aspiration precautions DIET DYS 3 Room service appropriate? Yes; Fluid consistency: Thin  EDUCATION NEEDS:   Education needs have been addressed  Skin:  Skin Assessment: Skin Integrity Issues: Skin Integrity Issues:: Stage IV Stage IV: ischial tuberosity  Last BM:  patient states 2/13  Height:   Ht Readings from Last 1 Encounters:  02/10/17 5\' 2"  (1.575 m)    Weight:   Wt Readings from Last 1 Encounters:  03/10/17 134 lb 7.7 oz (61 kg)    Ideal Body Weight:  45 kg  BMI:  Body mass index is 24.6 kg/m.  Estimated Nutritional Needs:   Kcal:  1500-1700  Protein:  75-85g  Fluid:  1.7L/day  Clayton Bibles, MS, RD, LDN [place] Dietitian Pager: [phone] After Hours Pager: [phone]

## 2017-03-10 NOTE — Evaluation (Signed)
Clinical/Bedside Swallow Evaluation Patient Details  Name: Angela Page MRN: [id number] Date of Birth: 08-04-1970  Today's Date: 03/10/2017 Time: SLP Start Time (ACUTE ONLY): 4235 SLP Stop Time (ACUTE ONLY): 1355 SLP Time Calculation (min) (ACUTE ONLY): 17 min  Past Medical History:  Past Medical History:  Diagnosis Date  . Anasarca 06/10/2016  . Asthma   . Cocaine use 10/08/2013  . GERD (gastroesophageal reflux disease)   . HCAP (healthcare-associated pneumonia) 06/09/2016  . Overdose of opiate or related narcotic ([place]) 09/02/2014  . Pacemaker   . Pleurisy   . Polysubstance dependence including opioid type drug, episodic abuse ([place]) 10/27/2013  . Protein calorie malnutrition ([place]) 10/31/2015  . Quadriparesis ([place])   . Quadriplegia, C5-C7 incomplete ([place]) 10/08/2013  . Stage IV pressure ulcer of sacral region [facility]) 10/31/2015   Past Surgical History:  Past Surgical History:  Procedure Laterality Date  . APPENDECTOMY    . BACK SURGERY  10/08/2013   fusion of spine, cervical region  . CARDIAC SURGERY    . CYSTOSCOPY W/ URETERAL STENT PLACEMENT Right 03/09/2017   Procedure: CYSTOSCOPY [name] STENT PLACEMENT;  Surgeon: Irine Seal, MD;  Location: [facility];  Service: Urology;  Laterality: Right;  . GASTROSTOMY  11/23/2015  . I&D EXTREMITY  10/28/2011   Procedure: IRRIGATION AND DEBRIDEMENT EXTREMITY;  Surgeon: Tennis Must, MD;  Location: [facility];  Service: Orthopedics;  Laterality: Right;  Irrigation and debridement right middle finger  . INSERTION OF SUPRAPUBIC CATHETER N/A 07/28/2016   Procedure: INSERTION OF SUPRAPUBIC CATHETER;  Surgeon: Franchot Gallo, MD;  Location: [facility];  Service: Urology;  Laterality: N/A;  . IR GENERIC HISTORICAL  11/23/2015   IR GASTROSTOMY TUBE MOD SED 11/23/2015 [facility]-INTERV RAD  . TRACHEOSTOMY     HPI:  47 y.o. 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 Pt has previously been seen by SLP with most recent FEES 06/11/16 recommending Dys 3 textures and thin liquids with use of chin tuck.   Assessment / Plan / Recommendation Clinical Impression  Pt is lethargic, requiring frequent stimulation to maintain alertness long enough to take single bites/sips. Suspect this is largely medication related, as RN reports that she received ativan recently. Pt has frequent coughing and audible wetness with thin and nectar-thick liquids, which seems reduced with boluses of puree. SLP provided Max verbal and physical assist to attempt a chin tuck, but this also does not decrease coughing. She takes only a few bites before declining additional boluses due to being too "tired".   Although I believe that her current level of function is limited by her decreased alertness, RN also describes coughing with thin liquids during lunch (before ativan was administered), the pt endorses frequent coughing with thin liquids PTA, and there is concern for PNA. A clear diet cannot be recommended at this time given current presentation (MD made aware), but I suspect pt would at minimum be appropriate for bites of puree from floor stock and meds crushed in puree as she becomes more alert. I am hopeful for further diet advancement, although SLP will have to f/u on next date to assess for other textures.  SLP Visit Diagnosis: Dysphagia, oropharyngeal phase (R13.12)    Aspiration Risk  Moderate aspiration risk    Diet Recommendation NPO except meds;Other (Comment)(bites of puree from floor stock)  Medication Administration: Crushed with puree    Other  Recommendations Oral Care Recommendations: Oral care QID   Follow up Recommendations (tba)      Frequency and Duration min 2x/week  2 weeks       Prognosis Prognosis for Safe Diet Advancement: Good       Swallow Study   General HPI: 47 y.o. 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 Pt has previously been seen by SLP with most recent FEES 06/11/16 recommending Dys 3 textures and thin liquids with use of chin tuck. Type of Study: Bedside Swallow Evaluation Previous Swallow Assessment: see HPI Diet Prior to this Study: Dysphagia 3 (soft);Thin liquids Temperature Spikes Noted: No Respiratory Status: Room air History of Recent Intubation: No Behavior/Cognition: Lethargic/Drowsy Oral Care Completed by SLP: No Self-Feeding Abilities: Needs assist Patient Positioning: Upright in bed Baseline Vocal Quality: Other (comment)(weak) Volitional Cough: Weak Volitional Swallow: Able to elicit    Oral/Motor/Sensory Function Overall Oral Motor/Sensory Function: Generalized oral weakness   Ice Chips Ice chips: Not tested   Thin Liquid Thin Liquid: Impaired Presentation: Straw Pharyngeal  Phase Impairments: Wet Vocal Quality;Cough - Immediate;Cough - Delayed    Nectar Thick Nectar Thick Liquid: Impaired Presentation: Straw Pharyngeal Phase Impairments: Cough - Immediate   Honey Thick Honey Thick Liquid: Not tested   Puree Puree: Within functional limits Presentation: Spoon   Solid   GO   Solid: Not tested        Germain Osgood 03/10/2017,2:18 PM  Germain Osgood, M.A. CCC-SLP [phone]

## 2017-03-10 NOTE — Progress Notes (Signed)
Pharmacy Antibiotic Note  Angela Page is a 47 y.o. female admitted on 03/09/2017 with epsis due to combination of pneumonia and right ureteric stone.  Pharmacy has been consulted for meropenem and vancomycin dosing.  Pt with a history of MDR UTI, also tracheostomy dependent with high risk for MRSA infection and Pseudomonas infection.     Plan:  Meropenem 1 gr IV q8h   Vancomycin 1250 mg IV x1, then starting 12 hours later start vancomycin 1250 mg IV q24h  Azithromycin 500 mg IV q24h per MD  Monitor clinical course, renal function, cultures as available   Weight: 134 lb 7.7 oz (61 kg)  Temp (24hrs), Avg:98.6 F (37 C), Min:97.8 F (36.6 C), Max:99 F (37.2 C)  Recent Labs  Lab 03/09/17 0654 03/09/17 1025 03/09/17 1336 03/09/17 1925 03/10/17 0337  WBC 16.1*  --   --  19.0* 16.1*  CREATININE 0.86  --   --  0.81 0.77  LATICACIDVEN  --  2.14* 2.22*  --   --     Estimated Creatinine Clearance: 75.6 mL/min (by C-G formula based on SCr of 0.77 mg/dL).    Allergies  Allergen Reactions  . Erythromycin Nausea And Vomiting  . Nitrofurantoin Monohyd Macro Nausea And Vomiting  . Penicillins Hives    Has patient had a PCN reaction causing immediate rash, facial/tongue/throat swelling, SOB or lightheadedness with hypotension: Yes Has patient had a PCN reaction causing severe rash involving mucus membranes or skin necrosis: Yes Has patient had a PCN reaction that required hospitalization No Has patient had a PCN reaction occurring within the last 10 years: No-childhood allergy If all of the above answers are "NO", then may proceed with Cephalosporin     Antimicrobials this admission: 2/18 cefepime >> 2/19 2/18 zithromax >> 2/19 vancomycin >>  2/19 meropenem  Dose adjustments this admission:    Microbiology results: 2/18 BCx: sent 2/18 UCx: sent    Thank you for allowing pharmacy to be a part of this patient's care.   Royetta Asal, PharmD, BCPS Pager  [phone] 03/10/2017 10:34 AM

## 2017-03-10 NOTE — Progress Notes (Signed)
@IPLOG @        PROGRESS NOTE                                                                                                                                                                                                             Patient Demographics:    Angela Page, is a 47 y.o. female, DOB - [birth year], KWI:[id number]  Admit date - 03/09/2017   Admitting Physician Ankit Arsenio Loader, MD  Outpatient Primary MD for the patient is Briscoe Deutscher, DO  LOS - 1  No chief complaint on file.      Brief Narrative  Angela Page is a 47 y.o. 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    In the ER she had Sepsis due to PNA and R.Uretric obstruction.     Subjective:    Angela Page today has, No headache, No chest pain, No abdominal pain - No Nausea, No new weakness tingling or numbness, +ve productive Cough - No SOB.     Assessment  & Plan :     1.  Sepsis due to combination of pneumonia and right ureteric stone.  History of MDR UTI, also tracheostomy dependent with high risk for MRSA infection and Pseudomonas infection.  She is still hypotensive, will give her a liter of normal saline bolus this morning again along with maintenance, follow cultures, requested pharmacy to place her on combination of vancomycin-meropenem and azithromycin.  Follow cultures closely.  Repeat lactate in the morning.  Currently does not appear toxic.  2.  Story of C-spine traumatic injury causing functional quadriparesis, neurogenic bladder with indwelling suprapubic catheter, tracheostomy dependence, PEG tube with tube feeds.  Continue supportive care.  She also takes food by mouth, for now soft diet and speech to evaluate.  Continue routine trach care per protocol.  3.  History of stage IV sacral decubitus  ulcers, also each buttock had one ulcer each -received wound care note for staging, none of the ulcers look infected, continue wound care.  4.  COPD.  At baseline.  For now supportive care and trach site care.  She is not on home CPAP or ventilator.  5.  Smoking and cocaine abuse in the past.  Counseled to abstain from all.  6.  Neuropathic pain.  Continue Cymbalta along with Neurontin.   Diet : DIET SOFT Room service appropriate? Yes; Fluid consistency:  Thin Aspiration precautions    Family Communication  :  None  Code Status :  Full  Disposition Plan  :  TBD  Consults  :  Urology  Procedures  :    CT -  9 mm proximal right ureteral stone with mild right hydronephrosis. Bilateral nephrolithiasis. Geographic fatty infiltration of the liver. Aortic atherosclerosis. Consolidation in the right lower lobe concerning for pneumonia.  Cystoscopy with insertion of right double-J stent by Dr Roni Bread 03-09-17    DVT Prophylaxis  : Heparin    Lab Results  Component Value Date   PLT 274 03/10/2017    Inpatient Medications  Scheduled Meds: . baclofen  20 mg Oral TID  . DULoxetine  60 mg Oral Daily  . feeding supplement (PRO-STAT SUGAR FREE 64)  30 mL Oral BID  . free water  100 mL Per Tube Q8H  . gabapentin  400 mg Oral QID  . heparin  5,000 Units Subcutaneous Q8H  . insulin aspart  0-5 Units Subcutaneous QHS  . insulin aspart  0-9 Units Subcutaneous TID WC  . ipratropium-albuterol  3 mL Nebulization TID  . Melatonin  5 mg Oral QHS  . midodrine  10 mg Per Tube TID WC  . naloxegol oxalate  25 mg Oral Daily  . polyethylene glycol  17 g Oral Daily  . tiZANidine  4 mg Oral TID  . vitamin B-12  1,000 mcg Oral Daily   Continuous Infusions: . sodium chloride 100 mL/hr at 03/09/17 1429  . azithromycin    . sodium chloride    . sodium chloride 1,000 mL (03/10/17 0814)   PRN Meds:.acetaminophen **OR** acetaminophen, alum & mag hydroxide-simeth, bisacodyl,  dextromethorphan-guaiFENesin, hydrocortisone, hydrocortisone cream, ipratropium-albuterol, lip balm, loratadine, LORazepam, magnesium citrate, magnesium hydroxide, morphine injection, MUSCLE RUB, ondansetron **OR** ondansetron (ZOFRAN) IV, oxybutynin, oxyCODONE, phenol, polyvinyl alcohol, senna-docusate, sodium chloride, sodium chloride, zolpidem  Antibiotics  :    Anti-infectives (From admission, onward)   Start     Dose/Rate Route Frequency Ordered Stop   03/10/17 1200  azithromycin (ZITHROMAX) 500 mg in sodium chloride 0.9 % 250 mL IVPB     500 mg 250 mL/hr over 60 Minutes Intravenous Every 24 hours 03/09/17 1132     03/10/17 0000  ceFEPIme (MAXIPIME) 2 g in sodium chloride 0.9 % 100 mL IVPB  Status:  Discontinued     2 g 200 mL/hr over 30 Minutes Intravenous Every 12 hours 03/09/17 1142 03/10/17 0940   03/09/17 1145  sulfamethoxazole-trimethoprim (BACTRIM DS,SEPTRA DS) 800-160 MG per tablet 1 tablet  Status:  Discontinued     1 tablet Oral 2 times daily 03/09/17 1132 03/09/17 1143   03/09/17 1045  ceFEPIme (MAXIPIME) 2 g in sodium chloride 0.9 % 100 mL IVPB     2 g 200 mL/hr over 30 Minutes Intravenous  Once 03/09/17 1032 03/09/17 1214   03/09/17 1045  azithromycin (ZITHROMAX) 500 mg in sodium chloride 0.9 % 250 mL IVPB     500 mg 250 mL/hr over 60 Minutes Intravenous  Once 03/09/17 1032 03/09/17 1402         Objective:   Vitals:   03/10/17 0409 03/10/17 0500 03/10/17 0715 03/10/17 0834  BP:      Pulse: 90   98  Resp: 18   16  Temp:   98.7 F (37.1 C)   TempSrc:   Oral   SpO2: 98%   99%  Weight:  61 kg (134 lb 7.7 oz)      Wt  Readings from Last 3 Encounters:  03/10/17 61 kg (134 lb 7.7 oz)  07/28/16 58.1 kg (128 lb 2 oz)  07/02/16 69.5 kg (153 lb 3.5 oz)     Intake/Output Summary (Last 24 hours) at 03/10/2017 0941 Last data filed at 03/10/2017 0600 Gross per 24 hour  Intake 875.5 ml  Output 1150 ml  Net -274.5 ml     Physical Exam  Awake Alert, Oriented X 3,  chronic functional quadriplegia with lower extremities 0/5 upper extremities 1/5, Normal affect [place].AT,PERRAL Supple Neck,No JVD, No cervical lymphadenopathy appriciated.  Trach site clean, Symmetrical Chest wall movement, Good air movement bilaterally, CTAB RRR,No Gallops,Rubs or new Murmurs, No Parasternal Heave +ve B.Sounds, Abd Soft, No tenderness, No organomegaly appriciated, No rebound - guarding or rigidity.  PEG tube site clean, suprapubic catheter in place No Cyanosis, Clubbing or edema, No new Rash or bruise      Data Review:    CBC Recent Labs  Lab 03/09/17 0654 03/09/17 1925 03/10/17 0337  WBC 16.1* 19.0* 16.1*  HGB 12.1 11.0* 10.3*  HCT 36.2 33.8* 31.6*  PLT 321 284 274  MCV 98.9 100.0 99.7  MCH 33.1 32.5 32.5  MCHC 33.4 32.5 32.6  RDW 15.4 15.9* 16.0*  LYMPHSABS 1.1  --   --   MONOABS 0.9  --   --   EOSABS 0.4  --   --   BASOSABS 0.0  --   --     Chemistries  Recent Labs  Lab 03/09/17 0654 03/09/17 1925 03/10/17 0337  NA 139  --  141  K 3.0*  --  4.3  CL 91*  --  103  CO2 33*  --  26  GLUCOSE 210*  --  141*  BUN 22*  --  16  CREATININE 0.86 0.81 0.77  CALCIUM 9.9  --  9.1  AST 63*  --  44*  ALT 53  --  38  ALKPHOS 78  --  66  BILITOT 0.2*  --  0.4   ------------------------------------------------------------------------------------------------------------------ No results for input(s): CHOL, HDL, LDLCALC, TRIG, CHOLHDL, LDLDIRECT in the last 72 hours.  Lab Results  Component Value Date   HGBA1C 5.6 06/13/2016   ------------------------------------------------------------------------------------------------------------------ No results for input(s): TSH, T4TOTAL, T3FREE, THYROIDAB in the last 72 hours.  Invalid input(s): FREET3 ------------------------------------------------------------------------------------------------------------------ No results for input(s): VITAMINB12, FOLATE, FERRITIN, TIBC, IRON, RETICCTPCT in the last 72  hours.  Coagulation profile Recent Labs  Lab 03/10/17 0337  INR 1.04    No results for input(s): DDIMER in the last 72 hours.  Cardiac Enzymes No results for input(s): CKMB, TROPONINI, MYOGLOBIN in the last 168 hours.  Invalid input(s): CK ------------------------------------------------------------------------------------------------------------------    Component Value Date/Time   BNP 150.8 (H) 06/09/2016 1447    Micro Results No results found for this or any previous visit (from the past 240 hour(s)).  Radiology Reports Dg Chest 2 View  Result Date: 03/09/2017 CLINICAL DATA:  Abdominal tightness EXAM: CHEST  2 VIEW COMPARISON:  10/17/2016 FINDINGS: Tracheostomy in good position. Dual lead pacemaker unchanged. Negative for heart failure. Mild bibasilar airspace disease unchanged. Some of this may be scarring or atelectasis. Small pleural effusions bilaterally IMPRESSION: Mild bibasilar airspace disease unchanged. Probable atelectasis or scarring. Small bilateral effusions. Electronically Signed   By: Franchot Gallo M.D.   On: 03/09/2017 09:09   Ct Abdomen Pelvis W Contrast  Result Date: 03/09/2017 CLINICAL DATA:  Abdominal pain EXAM: CT ABDOMEN AND PELVIS WITH CONTRAST TECHNIQUE: Multidetector CT imaging of the abdomen  and pelvis was performed using the standard protocol following bolus administration of intravenous contrast. CONTRAST:  <See Chart> ISOVUE-300 IOPAMIDOL (ISOVUE-300) INJECTION 61% COMPARISON:  11/12/2015 FINDINGS: Lower chest: Consolidation in the right lower lobe concerning for pneumonia. No effusions. Linear areas of atelectasis or scarring in the left lung base. Hepatobiliary: Geographic fatty infiltration of the liver. No focal abnormality. Gallbladder unremarkable. Pancreas: No focal abnormality or ductal dilatation. Spleen: No focal abnormality.  Normal size. Adrenals/Urinary Tract: 9 mm proximal right ureteral stone with mild hydronephrosis. Bilateral  nonobstructing renal stones. No hydronephrosis on the left. No adrenal mass. Foley catheter present in the bladder which is decompressed. Stomach/Bowel: Gastrostomy tube in the stomach. No evidence of bowel obstruction. Vascular/Lymphatic: Diffuse aortic and iliac calcifications. No evidence of aneurysm or adenopathy. Reproductive: Uterus and adnexa unremarkable.  No mass. Other: No free fluid or free air. Musculoskeletal: No acute bony abnormality. IMPRESSION: 9 mm proximal right ureteral stone with mild right hydronephrosis. Bilateral nephrolithiasis. Geographic fatty infiltration of the liver. Aortic atherosclerosis. Consolidation in the right lower lobe concerning for pneumonia. Electronically Signed   By: Rolm Baptise M.D.   On: 03/09/2017 09:21   Dg C-arm 1-60 Min-no Report  Result Date: 03/09/2017 Fluoroscopy was utilized by the requesting physician.  No radiographic interpretation.    Time Spent in minutes  30   Lala Lund M.D on 03/10/2017 at 9:41 AM  Between 7am to 7pm - Pager - [phone] ( page via [email or web address], text pages only, please mention full 10 digit call back number). After 7pm go to [email or web address] - password [email or web address]

## 2017-03-11 ENCOUNTER — Telehealth: Payer: Self-pay | Admitting: Family Medicine

## 2017-03-11 DIAGNOSIS — Z960 Presence of urogenital implants: Secondary | ICD-10-CM

## 2017-03-11 DIAGNOSIS — Z930 Tracheostomy status: Secondary | ICD-10-CM

## 2017-03-11 DIAGNOSIS — N201 Calculus of ureter: Secondary | ICD-10-CM

## 2017-03-11 DIAGNOSIS — J189 Pneumonia, unspecified organism: Secondary | ICD-10-CM

## 2017-03-11 DIAGNOSIS — A419 Sepsis, unspecified organism: Principal | ICD-10-CM

## 2017-03-11 DIAGNOSIS — N390 Urinary tract infection, site not specified: Secondary | ICD-10-CM

## 2017-03-11 DIAGNOSIS — Z931 Gastrostomy status: Secondary | ICD-10-CM

## 2017-03-11 LAB — GLUCOSE, CAPILLARY
GLUCOSE-CAPILLARY: 141 mg/dL — AB (ref 65–99)
GLUCOSE-CAPILLARY: 102 mg/dL — AB (ref 65–99)
GLUCOSE-CAPILLARY: 109 mg/dL — AB (ref 65–99)
Glucose-Capillary: 128 mg/dL — ABNORMAL HIGH (ref 65–99)
Glucose-Capillary: 125 mg/dL — ABNORMAL HIGH (ref 65–99)
Glucose-Capillary: 126 mg/dL — ABNORMAL HIGH (ref 65–99)

## 2017-03-11 LAB — PHOSPHORUS
PHOSPHORUS: 2.7 mg/dL (ref 2.5–4.6)
PHOSPHORUS: 2.3 mg/dL — AB (ref 2.5–4.6)
Phosphorus: 2.5 mg/dL (ref 2.5–4.6)

## 2017-03-11 LAB — COMPREHENSIVE METABOLIC PANEL
ALT: 39 U/L (ref 14–54)
ANION GAP: 7 (ref 5–15)
AST: 36 U/L (ref 15–41)
Albumin: 2.1 g/dL — ABNORMAL LOW (ref 3.5–5.0)
Alkaline Phosphatase: 76 U/L (ref 38–126)
BUN: 19 mg/dL (ref 6–20)
CHLORIDE: 108 mmol/L (ref 101–111)
CO2: 24 mmol/L (ref 22–32)
Calcium: 8.5 mg/dL — ABNORMAL LOW (ref 8.9–10.3)
Creatinine, Ser: 0.51 mg/dL (ref 0.44–1.00)
Glucose, Bld: 107 mg/dL — ABNORMAL HIGH (ref 65–99)
POTASSIUM: 3.6 mmol/L (ref 3.5–5.1)
Sodium: 139 mmol/L (ref 135–145)
Total Bilirubin: 0.4 mg/dL (ref 0.3–1.2)
Total Protein: 5 g/dL — ABNORMAL LOW (ref 6.5–8.1)

## 2017-03-11 LAB — CBC
HCT: 29.6 % — ABNORMAL LOW (ref 36.0–46.0)
Hemoglobin: 9.8 g/dL — ABNORMAL LOW (ref 12.0–15.0)
MCH: 33 pg (ref 26.0–34.0)
MCHC: 33.1 g/dL (ref 30.0–36.0)
MCV: 99.7 fL (ref 78.0–100.0)
PLATELETS: 270 10*3/uL (ref 150–400)
RBC: 2.97 MIL/uL — AB (ref 3.87–5.11)
RDW: 16.2 % — AB (ref 11.5–15.5)
WBC: 11.2 10*3/uL — ABNORMAL HIGH (ref 4.0–10.5)

## 2017-03-11 LAB — MAGNESIUM
MAGNESIUM: 1.7 mg/dL (ref 1.7–2.4)
Magnesium: 1.8 mg/dL (ref 1.7–2.4)
Magnesium: 1.7 mg/dL (ref 1.7–2.4)

## 2017-03-11 LAB — LACTIC ACID, PLASMA: LACTIC ACID, VENOUS: 0.9 mmol/L (ref 0.5–1.9)

## 2017-03-11 LAB — MRSA PCR SCREENING: MRSA by PCR: NEGATIVE

## 2017-03-11 MED ORDER — OSMOLITE 1.2 CAL PO LIQD
1000.0000 mL | ORAL | Status: DC
  Administered 2017-03-11: 1000 mL

## 2017-03-11 MED ORDER — ORAL CARE MOUTH RINSE
15.0000 mL | Freq: Two times a day (BID) | OROMUCOSAL | Status: DC
  Administered 2017-03-11: 15 mL via OROMUCOSAL

## 2017-03-11 MED ORDER — CHLORHEXIDINE GLUCONATE 0.12 % MT SOLN
15.0000 mL | Freq: Two times a day (BID) | OROMUCOSAL | Status: DC
  Administered 2017-03-11 – 2017-03-12 (×2): 15 mL via OROMUCOSAL
  Filled 2017-03-11: qty 15

## 2017-03-11 MED ORDER — MAGNESIUM HYDROXIDE 400 MG/5ML PO SUSP: 5.0000 mL | Freq: Every day | ORAL | Status: DC | PRN

## 2017-03-11 NOTE — Addendum Note (Signed)
Addendum  created 03/11/17 1134 by Effie Berkshire, MD   Sign clinical note

## 2017-03-11 NOTE — Progress Notes (Signed)
Patient having visual hallucinations, talking to her self. Reoriented patient. MD made aware. MD DC'd morphine.  Will continue to monitor.

## 2017-03-11 NOTE — Progress Notes (Signed)
Primary caregiver/legal guardian (mother, [name]) contacted to obtain information regarding patient's visual hallucinations. Per mother, patient has been having visual hallucinations on and off for about a year now. Per mother, "she will be okay, don't worry." MD made aware.   Will continue to monitor.

## 2017-03-11 NOTE — Progress Notes (Signed)
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 Patient found to have UTI, bilateral renal stone with right side hydronephrosis, she underwent cystoscopy with insertion of right  Double J stent on right. Patient also started on antibiotics for pneumonia.    Subjective   Patient seen and examined, complains of generalized body aches. Pain medications were held this morning for swallow evaluation   Assessment/Plan:     1. Sepsis due to pneumonia and right ureteral stone-patient has history of MDR UTI, tracheostomy dependent. Blood cultures are negative to date. Urine culture growing multiple species. Patient currently on vancomycin, Meropenem, azithromycin. Follow culture results over next 24 to 48 hours. 2. History of C-spine traumatic injury-resulting functional quality places, neurogenic bladder, tracheostomy dependent, peg tube with tube feeds. Continue supportive care. Swallow evaluation done today, patient started on dysphagia 3 diet. 3. History of stage IV sacral decubitus ulcer- wound care consulted. 4. COPD-breathing at baseline. Continue supportive care 5. Smoking and cocaine abuse in the past-counseld  6. Neuropathic pain-continue Cymbalta, Neurontin     DVT prophylaxis: heparin  Code Status: full code  Family Communication: no family at bedside  Disposition Plan: likely home once medically stable   Consultants:  urology  Procedures:  cystoscopy with insertion of right  Double J stent  Continuous infusions . sodium chloride 100 mL/hr at 03/09/17 1429  . azithromycin Stopped (03/10/17 1300)  . meropenem (MERREM) IV 1 g (03/11/17 1120)   . sodium chloride 1,000 mL (03/10/17 1802)  . vancomycin Stopped (03/10/17 2318)      Antibiotics:   Anti-infectives (From admission, onward)   Start     Dose/Rate Route Frequency Ordered Stop   03/11/17 0000  vancomycin (VANCOCIN) IVPB 750 mg/150 ml premix  Status:  Discontinued     750 mg 150 mL/hr over 60 Minutes Intravenous Every 12 hours 03/10/17 1012 03/10/17 1032   03/10/17 2200  vancomycin (VANCOCIN) 1,250 mg in sodium chloride 0.9 % 250 mL IVPB     1,250 mg 166.7 mL/hr over 90 Minutes Intravenous Every 24 hours 03/10/17 1032     03/10/17 1200  azithromycin (ZITHROMAX) 500 mg in sodium chloride 0.9 % 250 mL IVPB     500 mg 250 mL/hr over 60 Minutes Intravenous Every 24 hours 03/09/17 1132     03/10/17 1200  meropenem (MERREM) 1 g in sodium chloride 0.9 % 100 mL IVPB     1 g 200 mL/hr over 30 Minutes Intravenous Every 8 hours 03/10/17 1012     03/10/17 1100  vancomycin (VANCOCIN) 1,250 mg in sodium chloride 0.9 % 250 mL IVPB     1,250 mg 166.7 mL/hr over 90 Minutes Intravenous  Once 03/10/17 1012 03/10/17 1207   03/10/17 0000  ceFEPIme (MAXIPIME) 2 g in sodium chloride 0.9 % 100 mL IVPB  Status:  Discontinued     2 g 200 mL/hr over 30 Minutes Intravenous Every 12 hours 03/09/17 1142 03/10/17 0940   03/09/17 1145  sulfamethoxazole-trimethoprim (BACTRIM DS,SEPTRA DS) 800-160 MG per tablet 1 tablet  Status:  Discontinued     1 tablet Oral 2 times daily 03/09/17 1132 03/09/17 1143   03/09/17 1045  ceFEPIme (MAXIPIME) 2 g in sodium chloride 0.9 % 100 mL  IVPB     2 g 200 mL/hr over 30 Minutes Intravenous  Once 03/09/17 1032 03/09/17 1214   03/09/17 1045  azithromycin (ZITHROMAX) 500 mg in sodium chloride 0.9 % 250 mL IVPB     500 mg 250 mL/hr over 60 Minutes Intravenous  Once 03/09/17 1032 03/09/17 1402       Objective   Vitals:   03/11/17 0400 03/11/17 0800 03/11/17 0840 03/11/17 0845  BP: (!) 85/56     Pulse: 80   94  Resp: 14     Temp:  98.5 F (36.9 C)     TempSrc:  Oral    SpO2: 98%  100% 100%  Weight:        Intake/Output Summary (Last 24 hours) at 03/11/2017 1139 Last data filed at 03/11/2017 0600 Gross per 24 hour  Intake 1500 ml  Output 1105 ml  Net 395 ml   Filed Weights   03/10/17 0500  Weight: 61 kg (134 lb 7.7 oz)     Physical Examination:   Physical Exam: Eyes: No icterus, extraocular muscles intact  Mouth: Oral mucosa is moist, no lesions on palate,  Neck: Supple, no deformities, masses, or tenderness Lungs: Normal respiratory effort, bilateral clear to auscultation, no crackles or wheezes.  Heart: Regular rate and rhythm, S1 and S2 normal, no murmurs, rubs auscultated Abdomen: BS normoactive,soft,nondistended,non-tender to palpation,no organomegaly Extremities: No pretibial edema, no erythema, no cyanosis, no clubbing Neuro : Alert and oriented to time, quadriplegia  Skin: No rashes seen on exam       Data Reviewed: I have personally reviewed following labs and imaging studies  CBG: Recent Labs  Lab 03/10/17 1228 03/10/17 1538 03/10/17 1912 03/10/17 2216 03/11/17 0725  GLUCAP 213* 134* 92 167* 141*    CBC: Recent Labs  Lab 03/09/17 0654 03/09/17 1925 03/10/17 0337 03/11/17 0457  WBC 16.1* 19.0* 16.1* 11.2*  NEUTROABS 13.8*  --   --   --   HGB 12.1 11.0* 10.3* 9.8*  HCT 36.2 33.8* 31.6* 29.6*  MCV 98.9 100.0 99.7 99.7  PLT 321 284 274 381    Basic Metabolic Panel: Recent Labs  Lab 03/09/17 0654 03/09/17 1925 03/10/17 0337 03/10/17 2325 03/11/17 0457  NA 139  --  141  --  139  K 3.0*  --  4.3  --  3.6  CL 91*  --  103  --  108  CO2 33*  --  26  --  24  GLUCOSE 210*  --  141*  --  107*  BUN 22*  --  16  --  19  CREATININE 0.86 0.81 0.77  --  0.51  CALCIUM 9.9  --  9.1  --  8.5*  MG  --   --   --  1.8 1.7  PHOS  --   --   --  2.5 2.7    Recent Results (from the past 240 hour(s))  Urine culture     Status: Abnormal   Collection Time: 03/09/17  8:22 AM  Result Value Ref  Range Status   Specimen Description   Final    URINE, CLEAN CATCH Performed at [facility], [place] [address] [place], [place] [address]    Special Requests   Final    NONE Performed at [facility], [place] [address], [place], [place] [address]    Culture MULTIPLE SPECIES PRESENT, SUGGEST RECOLLECTION (A)  Final   Report Status 03/10/2017 FINAL  Final  Blood Culture (routine  x 2)     Status: None (Preliminary result)   Collection Time: 03/09/17 11:27 AM  Result Value Ref Range Status   Specimen Description   Final    BLOOD LEFT ANTECUBITAL Performed at [facility] [address], [place], [place] [address]    Special Requests   Final    IN PEDIATRIC BOTTLE Blood Culture adequate volume Performed at [facility] [place] [address], [place], [place] [address]    Culture   Final    NO GROWTH 1 DAY Performed at [facility], [place] [address] [place], [place] [address]    Report Status PENDING  Incomplete  Blood Culture (routine x 2)     Status: None (Preliminary result)   Collection Time: 03/09/17 11:45 AM  Result Value Ref Range Status   Specimen Description   Final    BLOOD LEFT HAND Performed at [facility] [address], [place], [place] [address]    Special Requests   Final    BOTTLES DRAWN AEROBIC AND ANAEROBIC Blood Culture adequate volume Performed at [place] [facility] [address], [place], [place] [address]    Culture   Final    NO GROWTH 1 DAY Performed at [facility], [place] [address], [place],  [address]    Report Status PENDING  Incomplete     Liver Function Tests: Recent Labs  Lab 03/09/17 0654 03/10/17 0337 03/11/17 0457  AST 63* 44* 36  ALT 53 38 39  ALKPHOS 78 66 76  BILITOT 0.2* 0.4 0.4  PROT 7.8 6.6 5.0*  ALBUMIN 3.4* 2.9* 2.1*   Recent Labs  Lab 03/09/17 0654  LIPASE 22   No results for input(s): AMMONIA in the  last 168 hours.  Cardiac Enzymes: No results for input(s): CKTOTAL, CKMB, CKMBINDEX, TROPONINI in the last 168 hours. BNP (last 3 results) Recent Labs    05/28/16 1342 06/09/16 1447  BNP 36.3 150.8*    ProBNP (last 3 results) No results for input(s): PROBNP in the last 8760 hours.    Studies: Dg C-arm 1-60 Min-no Report  Result Date: 03/09/2017 Fluoroscopy was utilized by the requesting physician.  No radiographic interpretation.    Scheduled Meds: . baclofen  20 mg Oral TID  . DULoxetine  60 mg Oral Daily  . feeding supplement (PRO-STAT SUGAR FREE 64)  30 mL Oral TID  . free water  100 mL Per Tube Q8H  . gabapentin  400 mg Oral QID  . heparin  5,000 Units Subcutaneous Q8H  . insulin aspart  0-5 Units Subcutaneous QHS  . insulin aspart  0-9 Units Subcutaneous TID WC  . ipratropium-albuterol  3 mL Nebulization TID  . Melatonin  5 mg Oral QHS  . midodrine  10 mg Per Tube TID WC  . naloxegol oxalate  25 mg Oral Daily  . polyethylene glycol  17 g Oral Daily  . tiZANidine  4 mg Oral TID  . vitamin B-12  1,000 mcg Oral Daily      Time spent: 25 min  [facility] Pager [phone]. If 7PM-7AM, please contact night-coverage at [email or web address], Office  [phone]  password [facility]  03/11/2017, 11:39 AM  LOS: 2 days

## 2017-03-11 NOTE — Telephone Encounter (Signed)
PER [employer]  Caller states she is the patient's caregiver. The pt did not feel like she could tolerate her tube feeding. She has been nauseated and vomiting. Has requested not to receive her feed again tonight. Last BM was today.

## 2017-03-11 NOTE — Progress Notes (Signed)
  Speech Language Pathology Treatment: Dysphagia  Patient Details Name: Angela Page MRN: [id number] DOB: 03-Dec-1970 Today's Date: 03/11/2017 Time: 0321-2248 SLP Time Calculation (min) (ACUTE ONLY): 23 min  Assessment / Plan / Recommendation Clinical Impression  Follow up yesterday after BSE for potential po initiation. This morning she is awake and alert due to RN holding off on giving morphine. Exhibited mild intermittent s/s aspiration primarily with use of straw. Small controlled cup sips decreased signs. Suspect pharyngeal residue given multiple swallows with one bolus although she denies globus sensation. Despite having few teeth in poor condition she masticated cracker timely with transit. Requested she ask family to bring partial/dentures to hospital. Being dependent on caregiver/staff to feed places her at increased aspiration risk and provided pt education pt re: importance of upright position and no straws with Dys 3 texture and thin liquids, pills with thin and some via PEG (per RN). ST will continue to follow for efficiency and upgrade if appropriate.     HPI HPI: 48 y.o. 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 Pt has previously been seen by SLP with most recent FEES 06/11/16 recommending Dys 3 textures and thin liquids with use of chin tuck.      SLP Plan  Continue with current plan of care       Recommendations  Diet recommendations: Dysphagia 3 (mechanical soft);Thin liquid Liquids provided via: Cup;No straw Medication Administration: Whole meds with liquid(some via PEG per RN) Supervision: Staff to assist with self feeding Compensations: Slow rate;Small sips/bites Postural Changes and/or Swallow Maneuvers: Seated upright 90 degrees                Oral Care  Recommendations: Oral care BID Follow up Recommendations: None SLP Visit Diagnosis: Dysphagia, unspecified (R13.10) Plan: Continue with current plan of care                      Houston Siren 03/11/2017, 8:43 AM  Orbie Pyo Colvin Caroli.[employer] [phone]

## 2017-03-11 NOTE — Plan of Care (Signed)
  Progressing Coping: Level of anxiety will decrease 03/11/2017 1851 - Progressing by Naomie Dean, RN

## 2017-03-12 ENCOUNTER — Ambulatory Visit: Payer: Self-pay | Admitting: Neurology

## 2017-03-12 DIAGNOSIS — L8944 Pressure ulcer of contiguous site of back, buttock and hip, stage 4: Secondary | ICD-10-CM

## 2017-03-12 DIAGNOSIS — S14109S Unspecified injury at unspecified level of cervical spinal cord, sequela: Secondary | ICD-10-CM

## 2017-03-12 LAB — CBC
HCT: 29.1 % — ABNORMAL LOW (ref 36.0–46.0)
HEMOGLOBIN: 9.6 g/dL — AB (ref 12.0–15.0)
MCH: 33.1 pg (ref 26.0–34.0)
MCHC: 33 g/dL (ref 30.0–36.0)
MCV: 100.3 fL — ABNORMAL HIGH (ref 78.0–100.0)
Platelets: 254 10*3/uL (ref 150–400)
RBC: 2.9 MIL/uL — AB (ref 3.87–5.11)
RDW: 15.9 % — ABNORMAL HIGH (ref 11.5–15.5)
WBC: 8.4 10*3/uL (ref 4.0–10.5)

## 2017-03-12 LAB — GLUCOSE, CAPILLARY
GLUCOSE-CAPILLARY: 137 mg/dL — AB (ref 65–99)
GLUCOSE-CAPILLARY: 108 mg/dL — AB (ref 65–99)
Glucose-Capillary: 145 mg/dL — ABNORMAL HIGH (ref 65–99)

## 2017-03-12 MED ORDER — LEVOFLOXACIN IN D5W 750 MG/150ML IV SOLN
750.0000 mg | INTRAVENOUS | Status: DC
Start: 2017-03-12 — End: 2017-03-12

## 2017-03-12 MED ORDER — MAGNESIUM SULFATE IN D5W 1-5 GM/100ML-% IV SOLN
1.0000 g | Freq: Once | INTRAVENOUS | Status: AC
  Administered 2017-03-12: 1 g via INTRAVENOUS
  Filled 2017-03-12: qty 100

## 2017-03-12 MED ORDER — POLYETHYLENE GLYCOL 3350 17 GM/SCOOP PO POWD: 17.0000 g | Freq: Every day | ORAL | 1 refills | Status: DC

## 2017-03-12 MED ORDER — SODIUM CHLORIDE 0.9 % IV BOLUS (SEPSIS)
500.0000 mL | Freq: Once | INTRAVENOUS | Status: AC
  Administered 2017-03-12: 500 mL via INTRAVENOUS

## 2017-03-12 MED ORDER — SODIUM CHLORIDE 0.9 % IV BOLUS (SEPSIS)
500.0000 mL | Freq: Once | INTRAVENOUS | Status: AC
Start: 2017-03-12 — End: 2017-03-12
  Administered 2017-03-12: 500 mL via INTRAVENOUS

## 2017-03-12 MED ORDER — LEVOFLOXACIN 750 MG PO TABS: 750.0000 mg | ORAL_TABLET | Freq: Every day | ORAL | 0 refills | Status: AC

## 2017-03-12 NOTE — Telephone Encounter (Signed)
Please advise 

## 2017-03-12 NOTE — Progress Notes (Signed)
Patient spoke with mother, POA and informed her that she was coming home via [place]. Patient with no complaints at the current time. Patient given D/C instructions and verbalizes understanding.

## 2017-03-12 NOTE — Discharge Summary (Signed)
Physician Discharge Summary  Angela Page KKX:[id number] DOB: Feb 14, 1970 DOA: 03/09/2017  PCP: Angela Deutscher, DO  Admit date: 03/09/2017 Discharge date: 03/12/2017  Time spent: 35 minutes  Recommendations for Outpatient Follow-up:  1. 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, stage 4 (HCC)   Tobacco abuse   Tracheostomy dependence (HCC)   Urinary bladder neurogenic dysfunction   Indwelling Foley catheter present   PEG (percutaneous endoscopic gastrostomy) status (HCC)   Sepsis secondary to UTI [facility])   Spinal cord injury, cervical region [facility])   Stage 4 skin ulcer of sacral region [facility])   UTI (urinary tract infection)   Right ureteral stone   Discharge Condition: Stable  Diet recommendation: Dys 3 diet  Filed Weights   03/10/17 0500  Weight: 61 kg (134 lb 7.7 oz)    History of present illness:  47 y.o.femalewith medical history significant oftobacco use, 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 Patient found to have UTI, bilateral renal stone with right side hydronephrosis, she underwent cystoscopy with insertion of right  Double J stent on right. Patient also started on antibiotics for pneumonia.    Hospital Course:   1. Sepsis due to pneumonia and right ureteral stone-  resolved, patient has history of MDR UTI, tracheostomy dependent. Blood cultures are negative to date. Urine culture growing multiple species. Patient  Was started on  vancomycin, Meropenem, azithromycin. Blood culture are negative to date, WBC is 8000, she has been afebrile. Patient wants to go home, will discharge on Po Levaquin 750 mg po daily x 3 more days. 2. History of C-spine traumatic injury-resulting functional quality places, neurogenic bladder, tracheostomy  dependent, peg tube with tube feeds. Continue supportive care. Swallow evaluation done today, patient started on dysphagia 3 diet. 3. History of stage IV sacral decubitus ulcer- wound care consulted. 4. COPD-breathing at baseline. Continue supportive care 5. Smoking and cocaine abuse in the past-counseld  6. Neuropathic pain-continue Cymbalta, Neurontin    Procedures:  None   Consultations:  None   Discharge Exam: Vitals:   03/12/17 0758 03/12/17 0800  BP: (!) 97/55 116/80  Pulse: 82 81  Resp: 19 20  Temp: 98.4 F (36.9 C)   SpO2:  100%    General: Appears in no acute distress Cardiovascular: S1S2 RRR Respiratory: Clear bilaterally  Discharge Instructions   Discharge Instructions    Diet - low sodium heart healthy   Complete by:  As directed    Increase activity slowly   Complete by:  As directed      Allergies as of 03/12/2017      Reactions   Erythromycin Nausea And Vomiting   Nitrofurantoin Monohyd Macro Nausea And Vomiting   Penicillins Hives   Has patient had a PCN reaction causing immediate rash, facial/tongue/throat swelling, SOB or lightheadedness with hypotension: Yes Has patient had a PCN reaction causing severe rash involving mucus membranes or skin necrosis: Yes Has patient had a PCN reaction that required hospitalization No Has patient had a PCN reaction occurring within the last 10 years: No-childhood allergy If all of the above answers are "NO", then may proceed with Cephalosporin       Medication List    TAKE these medications   acetaminophen 500 MG tablet Commonly known as:  TYLENOL Take 1,000 mg by mouth every 6 (  six) hours as needed for mild pain, moderate pain, fever or headache.   albuterol (2.5 MG/3ML) 0.083% nebulizer solution Commonly known as:  PROVENTIL Take 2.5 mg by nebulization every 6 (six) hours as needed for wheezing or shortness of breath.   albuterol 108 (90 Base) MCG/ACT inhaler Commonly known as:  PROVENTIL  HFA;VENTOLIN HFA Inhale 1-2 puffs into the lungs every 6 (six) hours as needed for wheezing or shortness of breath.   baclofen 20 MG tablet Commonly known as:  LIORESAL Take 20 mg by mouth 3 (three) times daily.   BALMEX SKIN PROTECTANT EX Apply 1 application topically 3 (three) times daily as needed (redness/irritation).   bisacodyl 5 MG EC tablet Commonly known as:  DULCOLAX Take 5 mg by mouth daily as needed for moderate constipation.   DULoxetine 60 MG capsule Commonly known as:  CYMBALTA Take 1 capsule (60 mg total) by mouth daily.   free water Soln Place 100 mLs into feeding tube every 8 (eight) hours.   furosemide 40 MG tablet Commonly known as:  LASIX TAKE 1 TABLET BY MOUTH EVERY DAY   gabapentin 400 MG capsule Commonly known as:  NEURONTIN Take 1 capsule (400 mg total) by mouth 4 (four) times daily.   ibuprofen 200 MG tablet Commonly known as:  ADVIL,MOTRIN Place 2 tablets (400 mg total) into feeding tube every 6 (six) hours as needed for moderate pain. What changed:    how much to take  reasons to take this   levofloxacin 750 MG tablet Commonly known as:  LEVAQUIN Take 1 tablet (750 mg total) by mouth daily for 3 days. Start taking on:  03/13/2017   lidocaine 5 % Commonly known as:  LIDODERM Place 1 patch onto the skin 2 (two) times daily as needed (pain).   magnesium citrate Soln Take 1 Bottle by mouth once.   magnesium hydroxide 400 MG/5ML suspension Commonly known as:  MILK OF MAGNESIA Take 5 mLs by mouth daily as needed for mild constipation.   Melatonin 5 MG Tabs Take 1 tablet (5 mg total) by mouth at bedtime.   midodrine 10 MG tablet Commonly known as:  PROAMATINE PLACE 1 TABLET (10 MG TOTAL) INTO FEEDING TUBE 3 (THREE) TIMES DAILY WITH MEALS.   MOVANTIK 25 MG Tabs tablet Generic drug:  naloxegol oxalate Take 25 mg by mouth daily.   nystatin 100000 UNIT/ML suspension Commonly known as:  MYCOSTATIN Take 5 mLs (500,000 Units total) by  mouth 4 (four) times daily. What changed:    when to take this  reasons to take this   ondansetron 4 MG tablet Commonly known as:  ZOFRAN Take 1 tablet (4 mg total) by mouth every 8 (eight) hours as needed for nausea or vomiting.   oxybutynin 5 MG tablet Commonly known as:  DITROPAN Take 5 mg by mouth every 8 (eight) hours as needed for bladder spasms.   Oxycodone HCl 10 MG Tabs Take 10 mg by mouth 4 (four) times daily as needed (for pain).   OXYGEN Inhale 3 L into the lungs continuous.   polyethylene glycol powder powder Commonly known as:  GLYCOLAX/MIRALAX Take 17 g by mouth daily.   PROMOD Liqd Take 30 mLs by mouth 2 (two) times daily. What changed:  Another medication with the same name was changed. Make sure you understand how and when to take each.   feeding supplement (OSMOLITE 1.2 CAL) Liqd Place 1,000 mLs into feeding tube continuous. What changed:  additional instructions   sulfamethoxazole-trimethoprim 800-160 MG  tablet Commonly known as:  BACTRIM DS,SEPTRA DS Take 1 tablet by mouth 2 (two) times daily.   tiotropium 18 MCG inhalation capsule Commonly known as:  SPIRIVA HANDIHALER Place 1 capsule (18 mcg total) into inhaler and inhale daily.   tiZANidine 4 MG tablet Commonly known as:  ZANAFLEX TAKE 1 TABLET (4 MG TOTAL) BY MOUTH 3 (THREE) TIMES DAILY.   vitamin B-12 1000 MCG tablet Commonly known as:  CYANOCOBALAMIN Take 1,000 mcg by mouth daily.   zolpidem 5 MG tablet Commonly known as:  AMBIEN Take 2 tablets (10 mg total) by mouth at bedtime as needed for sleep.      Allergies  Allergen Reactions  . Erythromycin Nausea And Vomiting  . Nitrofurantoin Monohyd Macro Nausea And Vomiting  . Penicillins Hives    Has patient had a PCN reaction causing immediate rash, facial/tongue/throat swelling, SOB or lightheadedness with hypotension: Yes Has patient had a PCN reaction causing severe rash involving mucus membranes or skin necrosis: Yes Has  patient had a PCN reaction that required hospitalization No Has patient had a PCN reaction occurring within the last 10 years: No-childhood allergy If all of the above answers are "NO", then may proceed with Cephalosporin    Follow-up Information    Franchot Gallo, MD.   Specialty:  Urology Why:  She will be scheduled for ureteroscopy by Dr. Diona Fanti in 1-2 weeks.  Contact information: [place] [address] [phone]            The results of significant diagnostics from this hospitalization (including imaging, microbiology, ancillary and laboratory) are listed below for reference.    Significant Diagnostic Studies: Dg Chest 2 View  Result Date: 03/09/2017 CLINICAL DATA:  Abdominal tightness EXAM: CHEST  2 VIEW COMPARISON:  10/17/2016 FINDINGS: Tracheostomy in good position. Dual lead pacemaker unchanged. Negative for heart failure. Mild bibasilar airspace disease unchanged. Some of this may be scarring or atelectasis. Small pleural effusions bilaterally IMPRESSION: Mild bibasilar airspace disease unchanged. Probable atelectasis or scarring. Small bilateral effusions. Electronically Signed   By: Franchot Gallo M.D.   On: 03/09/2017 09:09   Ct Abdomen Pelvis W Contrast  Result Date: 03/09/2017 CLINICAL DATA:  Abdominal pain EXAM: CT ABDOMEN AND PELVIS WITH CONTRAST TECHNIQUE: Multidetector CT imaging of the abdomen and pelvis was performed using the standard protocol following bolus administration of intravenous contrast. CONTRAST:  <See Chart> ISOVUE-300 IOPAMIDOL (ISOVUE-300) INJECTION 61% COMPARISON:  11/12/2015 FINDINGS: Lower chest: Consolidation in the right lower lobe concerning for pneumonia. No effusions. Linear areas of atelectasis or scarring in the left lung base. Hepatobiliary: Geographic fatty infiltration of the liver. No focal abnormality. Gallbladder unremarkable. Pancreas: No focal abnormality or ductal dilatation. Spleen: No focal abnormality.   Normal size. Adrenals/Urinary Tract: 9 mm proximal right ureteral stone with mild hydronephrosis. Bilateral nonobstructing renal stones. No hydronephrosis on the left. No adrenal mass. Foley catheter present in the bladder which is decompressed. Stomach/Bowel: Gastrostomy tube in the stomach. No evidence of bowel obstruction. Vascular/Lymphatic: Diffuse aortic and iliac calcifications. No evidence of aneurysm or adenopathy. Reproductive: Uterus and adnexa unremarkable.  No mass. Other: No free fluid or free air. Musculoskeletal: No acute bony abnormality. IMPRESSION: 9 mm proximal right ureteral stone with mild right hydronephrosis. Bilateral nephrolithiasis. Geographic fatty infiltration of the liver. Aortic atherosclerosis. Consolidation in the right lower lobe concerning for pneumonia. Electronically Signed   By: Rolm Baptise M.D.   On: 03/09/2017 09:21   Dg C-arm 1-60 Min-no Report  Result Date:  03/09/2017 Fluoroscopy was utilized by the requesting physician.  No radiographic interpretation.    Microbiology: Recent Results (from the past 240 hour(s))  Urine culture     Status: Abnormal   Collection Time: 03/09/17  8:22 AM  Result Value Ref Range Status   Specimen Description   Final    URINE, CLEAN CATCH Performed at [facility], [place] [address], [place], [place] [address]    Special Requests   Final    NONE Performed at [facility], [place] [address] [place], [place] [address]    Culture MULTIPLE SPECIES PRESENT, SUGGEST RECOLLECTION (A)  Final   Report Status 03/10/2017 FINAL  Final  Blood Culture (routine x 2)     Status: None (Preliminary result)   Collection Time: 03/09/17 11:27 AM  Result Value Ref Range Status   Specimen Description   Final    BLOOD LEFT ANTECUBITAL Performed at [facility] [address] [place], [place] [address]    Special Requests   Final    IN PEDIATRIC BOTTLE Blood Culture adequate  volume Performed at [facility] [address] [place], [place] [address]    Culture   Final    NO GROWTH 2 DAYS Performed at [facility] [address], [place], [place] [address]    Report Status PENDING  Incomplete  Blood Culture (routine x 2)     Status: None (Preliminary result)   Collection Time: 03/09/17 11:45 AM  Result Value Ref Range Status   Specimen Description   Final    BLOOD LEFT HAND Performed at [place] [facility] [address], [place], [place] [address]    Special Requests   Final    BOTTLES DRAWN AEROBIC AND ANAEROBIC Blood Culture adequate volume Performed at [facility] [address] [place], [place] [address]    Culture   Final    NO GROWTH 2 DAYS Performed at [facility] [address] [place], [place] [address]    Report Status PENDING  Incomplete  MRSA PCR Screening     Status: None   Collection Time: 03/11/17  8:26 AM  Result Value Ref Range Status   MRSA by PCR NEGATIVE NEGATIVE Final    Comment:        The GeneXpert MRSA Assay (FDA approved for NASAL specimens only), is one component of a comprehensive MRSA colonization surveillance program. It is not intended to diagnose MRSA infection nor to guide or monitor treatment for MRSA infections. Performed at [facility], [place] [address] [place],  [address]      Labs: Basic Metabolic Panel: Recent Labs  Lab 03/09/17 0654 03/09/17 1925 03/10/17 0337 03/10/17 2325 03/11/17 0457 03/11/17 1722  NA 139  --  141  --  139  --   K 3.0*  --  4.3  --  3.6  --   CL 91*  --  103  --  108  --   CO2 33*  --  26  --  24  --   GLUCOSE 210*  --  141*  --  107*  --   BUN 22*  --  16  --  19  --   CREATININE 0.86 0.81 0.77  --  0.51  --   CALCIUM 9.9  --  9.1  --  8.5*  --   MG  --   --   --  1.8 1.7 1.7  PHOS  --   --   --  2.5 2.7 2.3*   Liver Function Tests: Recent Labs  Lab 03/09/17 0654  03/10/17 0337 03/11/17 0457  AST 63* 44* 36  ALT 53 38 39  ALKPHOS 78 66 76  BILITOT 0.2* 0.4 0.4  PROT 7.8 6.6 5.0*  ALBUMIN 3.4* 2.9* 2.1*   Recent Labs  Lab 03/09/17 0654  LIPASE 22   No results for input(s): AMMONIA in the last 168 hours. CBC: Recent Labs  Lab 03/09/17 0654 03/09/17 1925 03/10/17 0337 03/11/17 0457 03/12/17 0324  WBC 16.1* 19.0* 16.1* 11.2* 8.4  NEUTROABS 13.8*  --   --   --   --   HGB 12.1 11.0* 10.3* 9.8* 9.6*  HCT 36.2 33.8* 31.6* 29.6* 29.1*  MCV 98.9 100.0 99.7 99.7 100.3*  PLT 321 284 274 270 254   Cardiac Enzymes: No results for input(s): CKTOTAL, CKMB, CKMBINDEX, TROPONINI in the last 168 hours. BNP: BNP (last 3 results) Recent Labs    05/28/16 1342 06/09/16 1447  BNP 36.3 150.8*    ProBNP (last 3 results) No results for input(s): PROBNP in the last 8760 hours.  CBG: Recent Labs  Lab 03/11/17 1728 03/11/17 2132 03/11/17 2326 03/12/17 0309 03/12/17 0728  GLUCAP 126* 102* 109* 137* 108*       Signed:  Oswald Hillock MD.  [facility] 03/12/2017, 10:38 AM

## 2017-03-12 NOTE — Care Management Note (Signed)
Case Management Note  Patient Details  Name: Angela Page MRN: [id number] Date of Birth: 1971/01/11  Subjective/Objective:[employer] rep Tommi Rumps aware of d/c home today w/HHRN-resumption. PTAR for ambulance transp. Per patient/nsg confirmed-patient spoke to her mother on phone who is @ home awaiting her arrival. Confirmed address.PTAR forms w/nsg secy. No further CM needs.                    Action/Plan:d/c home w/HHC/PTAR.   Expected Discharge Date:  03/12/17               Expected Discharge Plan:  [place]  In-House Referral:     Discharge planning Services     Post Acute Care Choice:  Home Health, Durable Medical Equipment(home [phone]) Choice offered to:  Patient, Parent  DME Arranged:    DME Agency:     HH Arranged:  RN, PCS/Personal Care Services [place] Agency:  [place]  Status of Service:  Completed, signed off  If discussed at [place] of Stay Meetings, dates discussed:    Additional Comments:  Dessa Phi, RN 03/12/2017, 11:58 AM

## 2017-03-12 NOTE — Telephone Encounter (Signed)
Called and spoke to mom daughter is still in hospital. She did not have a lot of information about frequency or symptoms. Instructed mom to call hospital and let nurse know concerns so that It can be addressed while she is there. She will call now and give [name] a call if any questions.

## 2017-03-12 NOTE — Telephone Encounter (Signed)
This is why she was just hospitalized, correct? To ER if worsening.

## 2017-03-12 NOTE — Progress Notes (Signed)
Pharmacy Antibiotic Note  Angela Page is a 47 y.o. female admitted on 03/09/2017 with sepsis due to combination of pneumonia and right ureteric stone.  Pharmacy has been consulted for meropenem and vancomycin dosing. MD dosing azithromycin.  Pt with a history of MDR UTI, also tracheostomy dependent with high risk for MRSA infection and Pseudomonas infection.    Today, 03/12/2017 Day #4 antibiotics Afebrile WBC improved to WNL SCr stable, CrCl ~75 ml/min Adjusting abx coverage from meropenem/azithromycin to levaquin per Rx dosing  Plan:  Levaquin 750mg  IV q24h  Continue Vancomycin 1250 mg IV q24h (day #3) - consider stopping since MRSA PCR is negative  Monitor clinical course, renal function, cultures as available   Weight: 134 lb 7.7 oz (61 kg)  Temp (24hrs), Avg:98.6 F (37 C), Min:97.9 F (36.6 C), Max:99.3 F (37.4 C)  Recent Labs  Lab 03/09/17 0654 03/09/17 1025 03/09/17 1336 03/09/17 1925 03/10/17 0337 03/11/17 0457 03/12/17 0324  WBC 16.1*  --   --  19.0* 16.1* 11.2* 8.4  CREATININE 0.86  --   --  0.81 0.77 0.51  --   LATICACIDVEN  --  2.14* 2.22*  --   --  0.9  --     Estimated Creatinine Clearance: 75.6 mL/min (by C-G formula based on SCr of 0.51 mg/dL).    Allergies  Allergen Reactions  . Erythromycin Nausea And Vomiting  . Nitrofurantoin Monohyd Macro Nausea And Vomiting  . Penicillins Hives    Has patient had a PCN reaction causing immediate rash, facial/tongue/throat swelling, SOB or lightheadedness with hypotension: Yes Has patient had a PCN reaction causing severe rash involving mucus membranes or skin necrosis: Yes Has patient had a PCN reaction that required hospitalization No Has patient had a PCN reaction occurring within the last 10 years: No-childhood allergy If all of the above answers are "NO", then may proceed with Cephalosporin     Antimicrobials this admission: 2/18 cefepime >> 2/19 2/18 zithromax >> 2/21 2/19 vancomycin >>   2/19 meropenem >> 2/21 2/21 levaquin >>  Dose adjustments this admission:   Microbiology results: 2/18 BCx: NGTD 2/18 UCx: multiple species 2/20 MRSA PCR: neg   Thank you for allowing pharmacy to be a part of this patient's care.  Peggyann Juba, PharmD, BCPS Pager: [phone] 03/12/2017 8:06 AM

## 2017-03-12 NOTE — Progress Notes (Signed)
MD made aware of pt. BP systolic in the 74F. Orders received: 500 mL bolus of NS to be given. Will continue to monitor.

## 2017-03-14 LAB — CULTURE, BLOOD (ROUTINE X 2)
CULTURE: NO GROWTH
Culture: NO GROWTH
SPECIAL REQUESTS: ADEQUATE
Special Requests: ADEQUATE

## 2017-03-16 DIAGNOSIS — L89154 Pressure ulcer of sacral region, stage 4: Secondary | ICD-10-CM | POA: Diagnosis present

## 2017-03-16 DIAGNOSIS — B359 Dermatophytosis, unspecified: Secondary | ICD-10-CM | POA: Diagnosis not present

## 2017-03-16 DIAGNOSIS — L89322 Pressure ulcer of left buttock, stage 2: Secondary | ICD-10-CM | POA: Diagnosis not present

## 2017-03-16 DIAGNOSIS — G8253 Quadriplegia, C5-C7 complete: Secondary | ICD-10-CM | POA: Diagnosis not present

## 2017-04-03 ENCOUNTER — Other Ambulatory Visit: Payer: Self-pay | Admitting: Acute Care

## 2017-04-03 ENCOUNTER — Other Ambulatory Visit: Payer: Self-pay | Admitting: Family Medicine

## 2017-04-03 IMAGING — CR DG CHEST 1V PORT
1 series · 1 of 1 positions shown · non-contrast
Comparison: 02/25/2015

CLINICAL DATA: Cough, congestion

EXAM:
PORTABLE CHEST 1 VIEW

[AP]
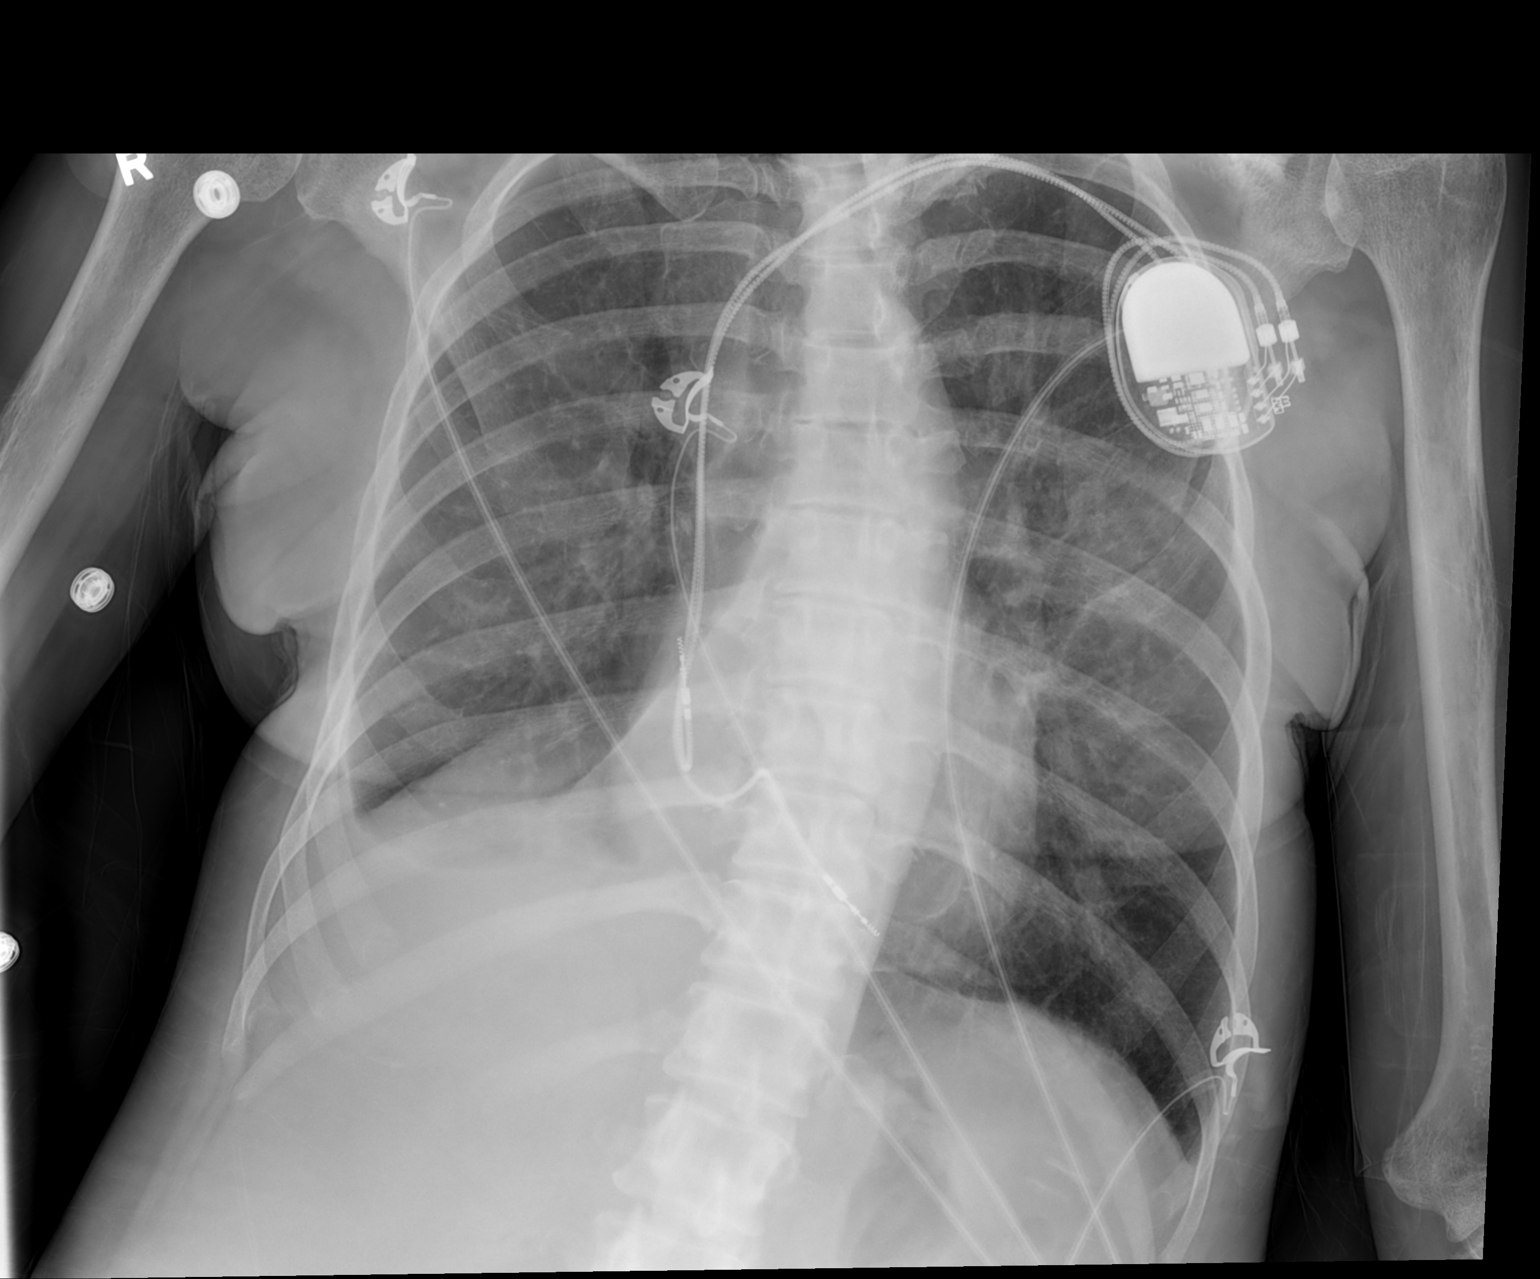

[1 of 1 positions shown; findings below may reference images not displayed]

FINDINGS: Cardiac shadow is stable. Pacing device is again noted and stable.
Lungs are well aerated with the exception of persistent changes in
the medial right lung base consistent with atelectasis and effusion.
Patient rotation is noted accentuating the mediastinal markings. No
acute bony abnormality is noted.
IMPRESSION: Persistent right basilar changes.

## 2017-04-03 MED ORDER — TIOTROPIUM BROMIDE MONOHYDRATE 18 MCG IN CAPS: 18.0000 ug | ORAL_CAPSULE | Freq: Every day | RESPIRATORY_TRACT | 12 refills | Status: DC

## 2017-04-03 NOTE — Progress Notes (Unsigned)
Mother called. Out of spiriva I sent refill order.   Erick Colace ACNP-BC [place] Pager # [phone] OR # [phone] if no answer

## 2017-04-03 NOTE — Telephone Encounter (Signed)
See note

## 2017-04-03 NOTE — Telephone Encounter (Signed)
LOV 11/10/16.   Next OV 04/22/17 with Dr. [name].  Ventolin inhaler and Lidocaine refill requests.  [place], [place] Raul [place] d.

## 2017-04-03 NOTE — Telephone Encounter (Signed)
Copied from [place] [phone]. Topic: Quick Communication - Rx Refill/Question >> Apr 03, 2017  1:14 PM Lolita Rieger, [place] wrote: Medication: ventolin inhaler lanocaine 5 mg   Has the patient contacted their pharmacy? yes   (Agent: If no, request that the patient contact the pharmacy for the refill.)   Preferred Pharmacy (with phone number or street name): [facility]   Agent: Please be advised that RX refills may take up to 3 business days. We ask that you follow-up with your pharmacy.

## 2017-04-06 ENCOUNTER — Encounter (HOSPITAL_BASED_OUTPATIENT_CLINIC_OR_DEPARTMENT_OTHER): Payer: Medicare Other | Attending: Internal Medicine

## 2017-04-06 DIAGNOSIS — I509 Heart failure, unspecified: Secondary | ICD-10-CM | POA: Diagnosis not present

## 2017-04-06 DIAGNOSIS — L89154 Pressure ulcer of sacral region, stage 4: Secondary | ICD-10-CM | POA: Diagnosis not present

## 2017-04-06 DIAGNOSIS — G8253 Quadriplegia, C5-C7 complete: Secondary | ICD-10-CM | POA: Insufficient documentation

## 2017-04-06 DIAGNOSIS — L89322 Pressure ulcer of left buttock, stage 2: Secondary | ICD-10-CM | POA: Insufficient documentation

## 2017-04-06 MED ORDER — ALBUTEROL SULFATE HFA 108 (90 BASE) MCG/ACT IN AERS: 1.0000 | INHALATION_SPRAY | Freq: Four times a day (QID) | RESPIRATORY_TRACT | 0 refills | Status: DC | PRN

## 2017-04-06 MED ORDER — LIDOCAINE 5 % EX PTCH: 1.0000 | MEDICATED_PATCH | Freq: Two times a day (BID) | CUTANEOUS | 0 refills | Status: DC | PRN

## 2017-04-06 NOTE — Telephone Encounter (Signed)
Please advise on refill. Historical provider.  

## 2017-04-06 NOTE — Telephone Encounter (Signed)
Okay refill but make sure she has appt.

## 2017-04-08 ENCOUNTER — Other Ambulatory Visit: Payer: Self-pay | Admitting: Family Medicine

## 2017-04-22 ENCOUNTER — Ambulatory Visit: Payer: Medicare Other | Admitting: Family Medicine

## 2017-04-27 ENCOUNTER — Other Ambulatory Visit: Payer: Self-pay | Admitting: Family Medicine

## 2017-04-27 DIAGNOSIS — R110 Nausea: Secondary | ICD-10-CM

## 2017-04-28 ENCOUNTER — Ambulatory Visit (INDEPENDENT_AMBULATORY_CARE_PROVIDER_SITE_OTHER): Payer: Medicare Other | Admitting: Family Medicine

## 2017-04-28 ENCOUNTER — Encounter: Payer: Self-pay | Admitting: Family Medicine

## 2017-04-28 ENCOUNTER — Telehealth: Payer: Self-pay

## 2017-04-28 ENCOUNTER — Other Ambulatory Visit: Payer: Self-pay

## 2017-04-28 VITALS — BP 118/74 | HR 98 | Temp 98.4°F

## 2017-04-28 DIAGNOSIS — I509 Heart failure, unspecified: Secondary | ICD-10-CM

## 2017-04-28 DIAGNOSIS — R5383 Other fatigue: Secondary | ICD-10-CM

## 2017-04-28 DIAGNOSIS — F331 Major depressive disorder, recurrent, moderate: Secondary | ICD-10-CM

## 2017-04-28 DIAGNOSIS — R300 Dysuria: Secondary | ICD-10-CM | POA: Diagnosis not present

## 2017-04-28 DIAGNOSIS — R635 Abnormal weight gain: Secondary | ICD-10-CM

## 2017-04-28 DIAGNOSIS — K5900 Constipation, unspecified: Secondary | ICD-10-CM

## 2017-04-28 LAB — CBC WITH DIFFERENTIAL/PLATELET
Basophils Absolute: 0.1 10*3/uL (ref 0.0–0.1)
Basophils Relative: 0.5 % (ref 0.0–3.0)
Eosinophils Absolute: 0.2 10*3/uL (ref 0.0–0.7)
Eosinophils Relative: 1.6 % (ref 0.0–5.0)
HCT: 40.1 % (ref 36.0–46.0)
Hemoglobin: 13.9 g/dL (ref 12.0–15.0)
Lymphocytes Relative: 15.4 % (ref 12.0–46.0)
Lymphs Abs: 1.8 10*3/uL (ref 0.7–4.0)
MCHC: 34.6 g/dL (ref 30.0–36.0)
MCV: 95.2 fl (ref 78.0–100.0)
Monocytes Absolute: 0.6 10*3/uL (ref 0.1–1.0)
Monocytes Relative: 5.2 % (ref 3.0–12.0)
Neutro Abs: 9 10*3/uL — ABNORMAL HIGH (ref 1.4–7.7)
Neutrophils Relative %: 77.3 % — ABNORMAL HIGH (ref 43.0–77.0)
Platelets: 415 10*3/uL — ABNORMAL HIGH (ref 150.0–400.0)
RBC: 4.21 Mil/uL (ref 3.87–5.11)
RDW: 15.4 % (ref 11.5–15.5)
WBC: 11.6 10*3/uL — ABNORMAL HIGH (ref 4.0–10.5)

## 2017-04-28 LAB — COMPREHENSIVE METABOLIC PANEL
ALT: 61 U/L — ABNORMAL HIGH (ref 0–35)
AST: 62 U/L — ABNORMAL HIGH (ref 0–37)
Albumin: 4.4 g/dL (ref 3.5–5.2)
Alkaline Phosphatase: 108 U/L (ref 39–117)
BUN: 21 mg/dL (ref 6–23)
CO2: 53 mEq/L — ABNORMAL HIGH (ref 19–32)
Calcium: 11 mg/dL — ABNORMAL HIGH (ref 8.4–10.5)
Chloride: 69 mEq/L — ABNORMAL LOW (ref 96–112)
Creatinine, Ser: 0.56 mg/dL (ref 0.40–1.20)
GFR: 123.53 mL/min (ref >60.00)
Glucose, Bld: 142 mg/dL — ABNORMAL HIGH (ref 70–99)
Potassium: 2.8 mEq/L — CL (ref 3.5–5.1)
Sodium: 128 mEq/L — ABNORMAL LOW (ref 135–145)
Total Bilirubin: 0.4 mg/dL (ref 0.2–1.2)
Total Protein: 8.6 g/dL — ABNORMAL HIGH (ref 6.0–8.3)

## 2017-04-28 LAB — URINALYSIS, ROUTINE W REFLEX MICROSCOPIC
Bilirubin Urine: NEGATIVE
Ketones, ur: NEGATIVE
Nitrite: POSITIVE — AB
Specific Gravity, Urine: <=1.005 — AB (ref 1.000–1.030)
Total Protein, Urine: NEGATIVE
Urine Glucose: NEGATIVE
Urobilinogen, UA: 0.2 (ref 0.0–1.0)
pH: 7.5 (ref 5.0–8.0)

## 2017-04-28 LAB — BRAIN NATRIURETIC PEPTIDE: Pro B Natriuretic peptide (BNP): 62 pg/mL (ref 0.0–100.0)

## 2017-04-28 LAB — MAGNESIUM: Magnesium: 2.7 mg/dL — ABNORMAL HIGH (ref 1.5–2.5)

## 2017-04-28 LAB — PHOSPHORUS: Phosphorus: 4 mg/dL (ref 2.3–4.6)

## 2017-04-28 MED ORDER — POTASSIUM CHLORIDE CRYS ER 20 MEQ PO TBCR: EXTENDED_RELEASE_TABLET | ORAL | 3 refills | Status: DC

## 2017-04-28 MED ORDER — FUROSEMIDE 40 MG PO TABS: 40.0000 mg | ORAL_TABLET | Freq: Every day | ORAL | 5 refills | Status: DC

## 2017-04-28 MED ORDER — DULOXETINE HCL 60 MG PO CPEP: 60.0000 mg | ORAL_CAPSULE | Freq: Every day | ORAL | 5 refills | Status: DC

## 2017-04-28 NOTE — Telephone Encounter (Signed)
[facility] lab called with critical result on patient. Potassium of 2.8.

## 2017-04-28 NOTE — Progress Notes (Signed)
Angela Page is a 47 y.o. female is here for follow up.  History of Present Illness:   Angela Page CMA acting as scribe for Dr. [name].  HPI: Patient in for 6 month follow up. She has questions about weight gain and referral to gastroenterologist. She is having some discomfort in abdomen due to feeding tube. Patient is still smoking.   Abnormal Labs: Patient states that she has had abnormal labs at another office. We have called to get those lab results.   Tube feeding: Patient states that the tube feeding does not agree with her any longer. When she does the tube feeding she feels nauseated and full. Patient states that she has put on about 60 pounds since her last visit. She does eat some regular food.Patient care giver said that patient has went 3 to 4 days without using the feeding tube. She would like referral to GI to discuss getting the feeding tube out. She does have difficulty eating bread. She has constipation and she thinks that it is from the G tube. She does feel like she will be able to take her medications without the G tube. She takes them orally now. We will get labs today.   Depression: she is feeling a little more depressed lately. She is having a lot of uncomfortable days that brings her down. Pain medicine has not altered her medications lately.   Neurology: she will be seeing neurology soon and she wants to discuss getting off of the Gabapentin. She has been having hallucinations about 2 to 3 hours after taking the Gabapentin.   Health Maintenance Due  Topic Date Due  . PAP SMEAR  09/11/1991   No flowsheet data found. PMHx, SurgHx, SocialHx, FamHx, Medications, and Allergies were reviewed in the Visit Navigator and updated as appropriate.   Patient Active Problem List   Diagnosis Date Noted  . Right ureteral stone 03/10/2017  . UTI (urinary tract infection) 03/09/2017  . Ineffective airway clearance   . Sepsis secondary to UTI ([place]) 06/30/2016  .  Autonomic neuropathy 06/10/2016  . Presence of permanent cardiac pacemaker 06/10/2016  . 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 Chronically on opiate therapy 06/10/2016  . Indwelling Foley catheter present 06/10/2016  . PEG (percutaneous endoscopic gastrostomy) status ([place]) 06/10/2016  . Pulmonary hypertension ([place]) 06/10/2016  . Hypoalbuminemia 06/10/2016  . Abnormal transaminases   . Peripheral edema   . Tracheostomy dependence ([place])   . Chronic obstructive pulmonary disease ([place])   . Chronic diastolic CHF (congestive heart failure) ([place]) 01/23/2016  . Tracheostomy status ([place])   . Esophageal dysphagia   . Malnutrition of moderate degree 11/01/2015  . Tobacco abuse 10/31/2015  . Asthma 10/31/2015  . History of pneumonia 10/08/2015  . Pressure ulcer of contiguous region involving buttock and hip, stage 4 ([place]) 02/26/2015  . Hypotension 02/25/2015  . Leukocytosis 10/23/2014  . Hypokalemia 10/23/2014  . Decubitus ulcer of sacral region, stage 4 ([place]) 10/23/2014  . Anemia, iron deficiency 10/23/2014  . Quadriplegia, C5-C7 incomplete ([place]) 10/22/2014  . Depression   . Protein-calorie malnutrition, severe ([place]) 09/05/2014  . 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 Recurrent UTI 03/31/2014  . Pressure ulcer of coccygeal region, stage 4 ([place]) 01/06/2014  . Stage 4 skin ulcer of sacral region ([place]) 01/06/2014  . Neuropathic pain 11/30/2013  . Paraplegia following spinal cord injury ([place]) 11/30/2013  . Spinal cord injury, cervical region ([place]) 11/30/2013  . Chronic pain due to injury 11/30/2013  . S/P  cervical spinal fusion 10/27/2013  . Tracheostomy care ([place]) 10/27/2013  . 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 SCI (spinal cord injury) 10/11/2013   Social History   Tobacco Use  . Smoking status: Current Every Day Smoker    Packs/day: 1.00    Years: 25.00    Pack years: 25.00    Types: Cigarettes  . Smokeless tobacco: Never Used  Substance  Use Topics  . Alcohol use: Yes    Comment: 2 x weekly  . Drug use: No   Current Medications and Allergies:   Current Outpatient Medications:  .  acetaminophen (TYLENOL) 500 MG tablet, Take 1,000 mg by mouth every 6 (six) hours as needed for mild pain, moderate pain, fever or headache. , Disp: , Rfl:  .  albuterol (PROVENTIL HFA;VENTOLIN HFA) 108 (90 Base) MCG/ACT inhaler, Inhale 1-2 puffs into the lungs every 6 (six) hours as needed for wheezing or shortness of breath., Disp: 1 Inhaler, Rfl: 0 .  albuterol (PROVENTIL) (2.5 MG/3ML) 0.083% nebulizer solution, Take 2.5 mg by nebulization every 6 (six) hours as needed for wheezing or shortness of breath., Disp: , Rfl:  .  baclofen (LIORESAL) 20 MG tablet, Take 20 mg by mouth 3 (three) times daily., Disp: , Rfl: 3 .  bisacodyl (DULCOLAX) 5 MG EC tablet, Take 5 mg by mouth daily as needed for moderate constipation., Disp: , Rfl:  .  DULoxetine (CYMBALTA) 60 MG capsule, Take 1 capsule (60 mg total) by mouth daily., Disp: 30 capsule, Rfl: 3 .  furosemide (LASIX) 40 MG tablet, TAKE 1 TABLET BY MOUTH EVERY DAY, Disp: 30 tablet, Rfl: 3 .  gabapentin (NEURONTIN) 400 MG capsule, TAKE 1 CAPSULE (400 MG TOTAL) BY MOUTH 4 (FOUR) TIMES DAILY., Disp: 120 capsule, Rfl: 6 .  ibuprofen (ADVIL,MOTRIN) 200 MG tablet, Place 2 tablets (400 mg total) into feeding tube every 6 (six) hours as needed for moderate pain. (Patient taking differently: Place 400-600 mg into feeding tube every 6 (six) hours as needed for fever, headache, mild pain, moderate pain or cramping. ), Disp: 30 tablet, Rfl: 0 .  lidocaine (LIDODERM) 5 %, Place 1 patch onto the skin 2 (two) times daily as needed (pain)., Disp: 30 patch, Rfl: 0 .  magnesium citrate SOLN, Take 1 Bottle by mouth once., Disp: , Rfl:  .  magnesium hydroxide (MILK OF MAGNESIA) 400 MG/5ML suspension, Take 5 mLs by mouth daily as needed for mild constipation., Disp: 360 mL, Rfl: 0 .  Melatonin 5 MG TABS, Take 1 tablet (5 mg total)  by mouth at bedtime., Disp: 30 tablet, Rfl: 5 .  midodrine (PROAMATINE) 10 MG tablet, PLACE 1 TABLET (10 MG TOTAL) INTO FEEDING TUBE 3 (THREE) TIMES DAILY WITH MEALS., Disp: 90 tablet, Rfl: 1 .  naloxegol oxalate (MOVANTIK) 25 MG TABS tablet, Take 25 mg by mouth daily., Disp: , Rfl:  .  Nutritional Supplements (FEEDING SUPPLEMENT, OSMOLITE 1.2 CAL,) LIQD, Place 1,000 mLs into feeding tube continuous. (Patient taking differently: Place 1,000 mLs into feeding tube continuous. 100 mLs Per Hour Over a 10 Hour Period=1,000 mLs. Feeding Given In The Evening From 2300 to 0900.), Disp: 1000 mL, Rfl: 12 .  Nutritional Supplements (PROMOD) LIQD, Take 30 mLs by mouth 2 (two) times daily., Disp: , Rfl:  .  nystatin (MYCOSTATIN) 100000 UNIT/ML suspension, Take 5 mLs (500,000 Units total) by mouth 4 (four) times daily. (Patient taking differently: Take 500,000 Units by mouth 4 (four) times daily as needed (tongue swelling). ), Disp: 60 mL, Rfl:  1 .  ondansetron (ZOFRAN) 4 MG tablet, TAKE 1 TABLET BY MOUTH EVERY 8 HOURS AS NEEDED FOR NAUSEA AND VOMITING, Disp: 20 tablet, Rfl: 1 .  oxybutynin (DITROPAN) 5 MG tablet, Take 5 mg by mouth every 8 (eight) hours as needed for bladder spasms., Disp: , Rfl: 11 .  Oxycodone HCl 10 MG TABS, Take 10 mg by mouth 4 (four) times daily as needed (for pain). , Disp: , Rfl:  .  OXYGEN, Inhale 3 L into the lungs continuous., Disp: , Rfl:  .  polyethylene glycol powder (GLYCOLAX/MIRALAX) powder, Take 17 g by mouth daily., Disp: 3350 g, Rfl: 1 .  Skin Protectants, Misc. (BALMEX SKIN PROTECTANT EX), Apply 1 application topically 3 (three) times daily as needed (redness/irritation). , Disp: , Rfl:  .  sulfamethoxazole-trimethoprim (BACTRIM DS,SEPTRA DS) 800-160 MG tablet, Take 1 tablet by mouth 2 (two) times daily., Disp: , Rfl: 1 .  tiotropium (SPIRIVA HANDIHALER) 18 MCG inhalation capsule, Place 1 capsule (18 mcg total) into inhaler and inhale daily., Disp: 30 capsule, Rfl: 12 .   tiZANidine (ZANAFLEX) 4 MG tablet, TAKE 1 TABLET (4 MG TOTAL) BY MOUTH 3 (THREE) TIMES DAILY., Disp: 90 tablet, Rfl: 3 .  vitamin B-12 (CYANOCOBALAMIN) 1000 MCG tablet, Take 1,000 mcg by mouth daily., Disp: , Rfl:  .  Water For Irrigation, Sterile (FREE WATER) SOLN, Place 100 mLs into feeding tube every 8 (eight) hours., Disp: 1000 mL, Rfl: 0 .  zolpidem (AMBIEN) 5 MG tablet, Take 2 tablets (10 mg total) by mouth at bedtime as needed for sleep., Disp: 20 tablet, Rfl: 0   Allergies  Allergen Reactions  . Erythromycin Nausea And Vomiting  . Nitrofurantoin Monohyd Macro Nausea And Vomiting  . Penicillins Hives    Has patient had a PCN reaction causing immediate rash, facial/tongue/throat swelling, SOB or lightheadedness with hypotension: Yes Has patient had a PCN reaction causing severe rash involving mucus membranes or skin necrosis: Yes Has patient had a PCN reaction that required hospitalization No Has patient had a PCN reaction occurring within the last 10 years: No-childhood allergy If all of the above answers are "NO", then may proceed with Cephalosporin    Review of Systems   Pertinent items are noted in the HPI. Otherwise, ROS is negative.  Vitals:   Vitals:   04/28/17 1010  BP: 118/74  Pulse: 98  Temp: 98.4 F (36.9 C)  TempSrc: Oral  SpO2: 94%     There is no height or weight on file to calculate BMI.   Physical Exam:   Physical Exam  Constitutional: She appears well-developed and well-nourished. No distress.  Quadriplegic.  Obvious weight gain since last visit.  HENT:  Head: Normocephalic and atraumatic.  Right Ear: External ear normal.  Left Ear: External ear normal.  Nose: Nose normal.  Mouth/Throat: Oropharynx is clear and moist.  Eyes: Pupils are equal, round, and reactive to light. Conjunctivae and EOM are normal.  Neck: No thyromegaly present.  Cardiovascular: Normal rate and normal heart sounds.  Pulmonary/Chest: Effort normal.  Abdominal: Soft.    Lymphadenopathy:    She has no cervical adenopathy.  Neurological: She is alert.  Skin: Skin is warm.  Psychiatric: She has a normal mood and affect. Her behavior is normal.  Nursing note and vitals reviewed.   Assessment and Plan:   Odessia was seen today for follow-up.  Diagnoses and all orders for this visit:  Fatigue, with depression Comments: Labs today. Orders: -     DULoxetine (CYMBALTA)  60 MG capsule; Take 1 capsule (60 mg total) by mouth daily. -     CBC with Differential/Platelet -     Comprehensive metabolic panel -     Magnesium -     Phosphorus  Weight gain Comments: Significant weight gain since her last visit.  Patient is still not compliant with her feedings due to feeling full.  Congestive heart failure, unspecified HF chronicity, unspecified heart failure type ([place]) Comments: BNP today to make sure that there is more fluid causing weight gain. Orders: -     furosemide (LASIX) 40 MG tablet; Take 1 tablet (40 mg total) by mouth daily. -     Brain natriuretic peptide  Dysuria Comments: Recent fever and UTI.  Will check urine due to fatigue. Orders: -     Urine Culture -     Urinalysis, Routine w reflex microscopic -     cephALEXin (KEFLEX) 500 MG capsule; Take 1 capsule (500 mg total) by mouth 3 (three) times daily.  Moderate episode of recurrent major depressive disorder ([place]) Comments: Ongoing.  Continue with current medications.  No suicidal ideations today.  Constipation, unspecified constipation type Comments: Patient has ongoing constipation which causes her to refuse her tube feedings.  Referral to GI to discuss.   . Reviewed expectations re: course of current medical issues. . Discussed self-management of symptoms. . Outlined signs and symptoms indicating need for more acute intervention. . Patient verbalized understanding and all questions were answered. Marland Kitchen Health Maintenance issues including appropriate healthy diet, exercise, and  smoking avoidance were discussed with patient. . See orders for this visit as documented in the electronic medical record. . Patient received an After Visit Summary.  Briscoe Deutscher, DO [place], [place] 05/01/2017  Future Appointments  Date Time Provider [place]  05/04/2017 10:45 AM [facility]  05/28/2017 11:30 AM Kathrynn Ducking, MD [facility] None  06/03/2017 11:00 AM [facility] None   Records requested if needed. Time spent with the patient: 50 minutes, of which >50% was spent in obtaining information about her symptoms, reviewing her previous labs, evaluations, and treatments, counseling her about her condition (please see the discussed topics above), and developing a plan to further investigate it; she had a number of questions which I addressed.

## 2017-04-28 NOTE — Telephone Encounter (Signed)
Please inform the patient and her caregiver.  Please go ahead and call [facility] 20 mEq with instructions to take twice daily for the next week.  She may then bring that down to once per day - to take when she takes Lasix.

## 2017-04-28 NOTE — Telephone Encounter (Signed)
Please advise 

## 2017-04-28 NOTE — Telephone Encounter (Signed)
Called patient gave all information she will call if any questions.

## 2017-04-29 LAB — URINE CULTURE
MICRO NUMBER:: 90436347
SPECIMEN QUALITY:: ADEQUATE

## 2017-04-29 MED ORDER — CEPHALEXIN 500 MG PO CAPS: 500.0000 mg | ORAL_CAPSULE | Freq: Three times a day (TID) | ORAL | 0 refills | Status: DC

## 2017-05-04 ENCOUNTER — Encounter (HOSPITAL_BASED_OUTPATIENT_CLINIC_OR_DEPARTMENT_OTHER): Payer: Medicare Other | Attending: Internal Medicine

## 2017-05-04 DIAGNOSIS — L89154 Pressure ulcer of sacral region, stage 4: Secondary | ICD-10-CM | POA: Diagnosis not present

## 2017-05-04 DIAGNOSIS — I509 Heart failure, unspecified: Secondary | ICD-10-CM | POA: Diagnosis not present

## 2017-05-04 DIAGNOSIS — L89323 Pressure ulcer of left buttock, stage 3: Secondary | ICD-10-CM | POA: Diagnosis not present

## 2017-05-04 DIAGNOSIS — L89150 Pressure ulcer of sacral region, unstageable: Secondary | ICD-10-CM | POA: Diagnosis not present

## 2017-05-04 DIAGNOSIS — G8253 Quadriplegia, C5-C7 complete: Secondary | ICD-10-CM | POA: Insufficient documentation

## 2017-05-06 ENCOUNTER — Other Ambulatory Visit: Payer: Self-pay | Admitting: Family Medicine

## 2017-05-06 NOTE — Telephone Encounter (Signed)
Please advise on refill.

## 2017-05-08 NOTE — Telephone Encounter (Signed)
proamatine LOV: ? Last Refill 03/25/17 PCP: Dr Briscoe Deutscher Pharmacy: [facility] [place]

## 2017-05-08 NOTE — Telephone Encounter (Signed)
Copied from [place] [phone] [phone]. Topic: Quick Communication - Rx Refill/Question >> May 08, 2017  1:01 PM Oliver Pila B wrote: Medication: midodrine (PROAMATINE) 10 MG tablet [[id number]] Has the patient contacted their pharmacy? Yes.   (Agent: If no, request that the patient contact the pharmacy for the refill.) Preferred Pharmacy (with phone number [facility] street [facility] [address] Agent: Please be advised that RX refills may take up to 3 business days. We ask that you follow-up with your pharmacy.

## 2017-05-14 ENCOUNTER — Telehealth: Payer: Self-pay | Admitting: Gastroenterology
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
## 2017-05-14 NOTE — Telephone Encounter (Signed)
Patient mother who is patients legal guardian states pt is having gi symptoms including constipation, and abd pain. Patient mother states patient had a gtube put in a [facility] but does not remember which doc. Patient mother is now wanting pt seen in office. Please advise on scheduling. Dr.Gupta is DOD for today 4.25.19

## 2017-05-14 NOTE — Telephone Encounter (Signed)
Thanks for letting me know!

## 2017-05-17 ENCOUNTER — Other Ambulatory Visit: Payer: Self-pay | Admitting: Family Medicine

## 2017-05-17 DIAGNOSIS — G4700 Insomnia, unspecified: Secondary | ICD-10-CM

## 2017-05-20 ENCOUNTER — Telehealth: Payer: Self-pay | Admitting: Family Medicine

## 2017-05-20 ENCOUNTER — Ambulatory Visit (HOSPITAL_COMMUNITY): Payer: Medicare Other

## 2017-05-20 NOTE — Telephone Encounter (Signed)
Have you received this?

## 2017-05-20 NOTE — Telephone Encounter (Signed)
Signed today

## 2017-05-20 NOTE — Telephone Encounter (Signed)
Copied from [place] [id number]. Topic: Quick Communication - See Telephone Encounter >> May 20, 2017  1:05 PM Neva Seat wrote: Sula Rumple w/ [place] [phone]  Fax 04-30-17 Refaxed 05-15-17  Addendums/ orders for Dr. [name] signatures. Please call Gaspar Bidding back to confirm they were received.

## 2017-05-21 ENCOUNTER — Other Ambulatory Visit: Payer: Self-pay | Admitting: Family Medicine

## 2017-05-28 ENCOUNTER — Encounter

## 2017-05-28 ENCOUNTER — Encounter: Payer: Self-pay | Admitting: Neurology

## 2017-05-28 ENCOUNTER — Ambulatory Visit (INDEPENDENT_AMBULATORY_CARE_PROVIDER_SITE_OTHER): Payer: Medicare Other | Admitting: Neurology

## 2017-05-28 VITALS — BP 79/61 | HR 80

## 2017-05-28 DIAGNOSIS — G8254 Quadriplegia, C5-C7 incomplete: Secondary | ICD-10-CM | POA: Diagnosis not present

## 2017-05-28 DIAGNOSIS — M62838 Other muscle spasm: Secondary | ICD-10-CM | POA: Diagnosis not present

## 2017-05-28 DIAGNOSIS — M792 Neuralgia and neuritis, unspecified: Secondary | ICD-10-CM | POA: Diagnosis not present

## 2017-05-28 MED ORDER — TIZANIDINE HCL 2 MG PO TABS: 2.0000 mg | ORAL_TABLET | Freq: Three times a day (TID) | ORAL | 3 refills | Status: DC

## 2017-05-28 MED ORDER — GABAPENTIN 600 MG PO TABS
600.0000 mg | ORAL_TABLET | Freq: Four times a day (QID) | ORAL | 3 refills | Status: DC
Start: 2017-05-28 — End: 2017-09-21

## 2017-05-28 NOTE — Progress Notes (Signed)
Reason for visit: Spinal cord injury  Referring physician: Dr. Janann Colonel Angela Page is a 47 y.o. female  History of present illness:  Angela Page is a 47 year old right-handed white female with a history of a spinal cord injury following a fall in 2015.  The patient sustained a C5-6 level spinal cord injury, she has no real motor or sensory function below the C6 level.  The patient has developed spasticity in all 4 extremities, she has flexion of the fingers, but her therapists have been diligent about range of movement exercises over the years, the patient is still able to get her finger straight with therapy.  She has very little sensation of the hands, even when the fingers are straight and she is not able to use the hands well, she cannot feed herself.  The patient gets most of her nutrition through a G-tube, but she is able to take some oral nutrition as well.  The patient denies any significant spasticity in the lower extremities.  She claims that she has some hallucinations with the medications, she is concerned that the gabapentin is the etiology but she is on tizanidine as well taking 4 mg 3 times daily.  The patient will take a nap and when she wakes up she will see things.  The patient is followed through a pain center, she gets oxycodone 10 mg 4 times daily on average.  Her pain level remains severe, she has deep throbbing pain in both arms and hands, and into the left foot.  She may have electric shock pains as well.  She takes Cymbalta 60 mg daily.  She feels hot all the time.  She does have a small sacral and ischial pressure sore.  She takes medications at night for sleep, she is able to get up to 5 hours of sleep at night.  She is sent to this office for an evaluation.  Past Medical History:  Diagnosis Date  . Anasarca 06/10/2016  . Asthma   . Cocaine use 10/08/2013  . GERD (gastroesophageal reflux disease)   . HCAP (healthcare-associated pneumonia) 06/09/2016  .  Overdose of opiate or related narcotic ([place]) 09/02/2014  . Pacemaker   . Pleurisy   . Polysubstance dependence including opioid type drug, episodic abuse ([place]) 10/27/2013  . Protein calorie malnutrition ([place]) 10/31/2015  . Quadriparesis ([place])   . Quadriplegia, C5-C7 incomplete ([place]) 10/08/2013  . Stage IV pressure ulcer of sacral region ([place]) 10/31/2015    Past Surgical History:  Procedure Laterality Date  . APPENDECTOMY    . BACK SURGERY  10/08/2013   fusion of spine, cervical region  . CARDIAC SURGERY    . CYSTOSCOPY W/ URETERAL STENT PLACEMENT Right 03/09/2017   Procedure: CYSTOSCOPY Wyvonnia Page STENT PLACEMENT;  Surgeon: Irine Seal, MD;  Location: [facility];  Service: Urology;  Laterality: Right;  . GASTROSTOMY  11/23/2015  . I&D EXTREMITY  10/28/2011   Procedure: IRRIGATION AND DEBRIDEMENT EXTREMITY;  Surgeon: Tennis Must, MD;  Location: [facility];  Service: Orthopedics;  Laterality: Right;  Irrigation and debridement right middle finger  . INSERTION OF SUPRAPUBIC CATHETER N/A 07/28/2016   Procedure: INSERTION OF SUPRAPUBIC CATHETER;  Surgeon: Franchot Gallo, MD;  Location: [facility];  Service: Urology;  Laterality: N/A;  . IR GENERIC HISTORICAL  11/23/2015   IR GASTROSTOMY TUBE MOD SED 11/23/2015 [facility]  . TRACHEOSTOMY      Family History  Problem Relation Age of Onset  . Hypertension Maternal Uncle   .  Kidney failure Maternal Uncle   . Colon cancer Mother   . Lung cancer Father   . Hypertension Brother   . Kidney failure Maternal Aunt   . Kidney failure Maternal Uncle     Social history:  reports that she has been smoking cigarettes.  She has a 25.00 pack-year smoking history. She has never used smokeless tobacco. She reports that she drinks alcohol. She reports that she does not use drugs.  Medications:  Prior to Admission medications   Medication Sig Start Date End Date Taking? Authorizing Provider  acetaminophen (TYLENOL) 500 MG tablet Take 1,000 mg  by mouth every 6 (six) hours as needed for mild pain, moderate pain, fever or headache.    Yes [provider]  albuterol (PROVENTIL HFA;VENTOLIN HFA) 108 (90 Base) MCG/ACT inhaler INHALE 1-2 PUFFS INTO THE LUNGS EVERY 6 (SIX) HOURS AS NEEDED FOR WHEEZING OR SHORTNESS OF BREATH. 05/25/17  Yes Briscoe Deutscher, DO  albuterol (PROVENTIL) (2.5 MG/3ML) 0.083% nebulizer solution Take 2.5 mg by nebulization every 6 (six) hours as needed for wheezing or shortness of breath.   Yes [provider]  alum & mag hydroxide-simeth (MAALOX/MYLANTA) 200-200-20 MG/5ML suspension    Yes [provider]  baclofen (LIORESAL) 20 MG tablet Take 20 mg by mouth 3 (three) times daily. 12/14/15  Yes [provider]  [facility] MELATONIN 5 MG TABS TAKE 1 TABLET BY MOUTH AT BEDTIME 05/18/17  Yes Briscoe Deutscher, DO  DULoxetine (CYMBALTA) 60 MG capsule Take 1 capsule (60 mg total) by mouth daily. 04/28/17  Yes Briscoe Deutscher, DO  furosemide (LASIX) 40 MG tablet Take 1 tablet (40 mg total) by mouth daily. 04/28/17  Yes Briscoe Deutscher, DO  gabapentin (NEURONTIN) 400 MG capsule TAKE 1 CAPSULE (400 MG TOTAL) BY MOUTH 4 (FOUR) TIMES DAILY. 04/08/17 11/04/17 Yes Briscoe Deutscher, DO  ibuprofen (ADVIL,MOTRIN) 200 MG tablet Place 2 tablets (400 mg total) into feeding tube every 6 (six) hours as needed for moderate pain. Patient taking differently: Place 400-600 mg into feeding tube every 6 (six) hours as needed for fever, headache, mild pain, moderate pain or cramping.  11/26/15  Yes Florencia Reasons, MD  magnesium citrate SOLN Take 1 Bottle by mouth once.   Yes [provider]  magnesium hydroxide (MILK OF MAGNESIA) 400 MG/5ML suspension Take 5 mLs by mouth daily as needed for mild constipation. 11/10/16  Yes Briscoe Deutscher, DO  Methylnaltrexone Bromide (RELISTOR) 150 MG TABS Take by mouth 3 (three) times daily as needed.   Yes [provider]  midodrine (PROAMATINE) 10 MG tablet PLACE 1 TABLET (10 MG TOTAL) INTO  FEEDING TUBE 3 (THREE) TIMES DAILY WITH MEALS. 05/08/17  Yes Briscoe Deutscher, DO  Nutritional Supplements (FEEDING SUPPLEMENT, OSMOLITE 1.2 CAL,) LIQD Place 1,000 mLs into feeding tube continuous. Patient taking differently: Place 1,000 mLs into feeding tube continuous. 100 mLs Per Hour Over a 10 Hour Period=1,000 mLs. Feeding Given In The Evening From 2300 to 0900. 01/28/17  Yes Briscoe Deutscher, DO  Nutritional Supplements (PROMOD) LIQD Take 30 mLs by mouth 2 (two) times daily.   Yes [provider]  nystatin (MYCOSTATIN) 100000 UNIT/ML suspension Take 5 mLs (500,000 Units total) by mouth 4 (four) times daily. Patient taking differently: Take 500,000 Units by mouth 4 (four) times daily as needed (tongue swelling).  12/09/16  Yes Nat Christen, MD  ondansetron (ZOFRAN) 4 MG tablet TAKE 1 TABLET BY MOUTH EVERY 8 HOURS AS NEEDED FOR NAUSEA AND VOMITING 04/28/17  Yes Briscoe Deutscher, DO  Oxycodone HCl 10 MG TABS Take 10 mg by mouth 4 (four) times daily as needed (for pain).    Yes [provider]  OXYGEN Inhale 3 L into the lungs continuous.   Yes [provider]  polyethylene glycol powder (GLYCOLAX/MIRALAX) powder Take 17 g by mouth daily. 03/12/17  Yes Oswald Hillock, MD  potassium chloride SA (K-DUR,KLOR-CON) 20 MEQ tablet 1 tab bid for 1 week then take 1 tab daily after that. 04/28/17  Yes Briscoe Deutscher, DO  Skin Protectants, Misc. (BALMEX SKIN PROTECTANT EX) Apply 1 application topically 3 (three) times daily as needed (redness/irritation).    Yes [provider]  tiotropium (SPIRIVA HANDIHALER) 18 MCG inhalation capsule Place 1 capsule (18 mcg total) into inhaler and inhale daily. 04/03/17  Yes Erick Colace, NP  Water For Irrigation, Sterile (FREE WATER) SOLN Place 100 mLs into feeding tube every 8 (eight) hours. 11/26/15  Yes Florencia Reasons, MD  zolpidem (AMBIEN) 5 MG tablet Take 2 tablets (10 mg total) by mouth at bedtime as needed for sleep. 07/28/16  Yes Dahlstedt, Annie Main, MD    gabapentin (NEURONTIN) 600 MG tablet Take 1 tablet (600 mg total) by mouth 4 (four) times daily. 05/28/17   Kathrynn Ducking, MD  tiZANidine (ZANAFLEX) 2 MG tablet Take 1 tablet (2 mg total) by mouth 3 (three) times daily. 05/28/17   Kathrynn Ducking, MD      Allergies  Allergen Reactions  . Erythromycin Nausea And Vomiting  . Nitrofurantoin Monohyd Macro Nausea And Vomiting  . Penicillins Hives    Has patient had a PCN reaction causing immediate rash, facial/tongue/throat swelling, SOB or lightheadedness with hypotension: Yes Has patient had a PCN reaction causing severe rash involving mucus membranes or skin necrosis: Yes Has patient had a PCN reaction that required hospitalization No Has patient had a PCN reaction occurring within the last 10 years: No-childhood allergy If all of the above answers are "NO", then may proceed with Cephalosporin     ROS:  Out of a complete 14 system review of symptoms, the patient complains only of the following symptoms, and all other reviewed systems are negative.  Weight gain, fatigue Heart murmur, swelling in the legs Ringing in the ears, difficulty swallowing Skin rash, itching, moles Shortness of breath, cough, wheezing Incontinence of the bowels and bladder, constipation Blood in the urine Feeling hot, increased thirst, flushing Allergies, skin sensitivity, frequent infections Memory loss, confusion, headache, numbness, difficulty swallowing, dizziness Depression, anxiety, none of sleep, change in appetite, disinterest in activities, suicidal thoughts, hallucinations  Blood pressure (!) 79/61, pulse 80, last menstrual period 05/27/2016.  Physical Exam  General: The patient is alert and cooperative at the time of the examination.  Eyes: Pupils are equal, round, and reactive to light. Discs are flat bilaterally.  Neck: The neck is supple, no carotid bruits are noted.  Respiratory: The respiratory examination is  clear.  Cardiovascular: The cardiovascular examination reveals a regular rate and rhythm, no obvious murmurs or rubs are noted.  Skin: Extremities are with 1+ edema in the arms bilaterally, 2+ edema in the legs.  Neurologic Exam  Mental status: The patient is alert and oriented x 3 at the time of the examination. The patient has apparent normal recent and remote memory, with an apparently normal attention span and concentration ability.  Cranial nerves: Facial symmetry is present. There is good sensation of the face to pinprick and soft touch bilaterally. The strength of the facial muscles and the muscles  to head turning and shoulder shrug are normal bilaterally. Speech is well enunciated, no aphasia or dysarthria is noted. Extraocular movements are full. Visual fields are full. The tongue is midline, and the patient has symmetric elevation of the soft palate. No obvious hearing deficits are noted.  Motor: The motor testing reveals good strength with the deltoid and biceps muscles bilaterally, the patient can elevate arms bilaterally.  Patient has minimal strength with triceps, grip strength, or ability to straighten the fingers or flex or extend the wrists on either side.  The patient has no voluntary movement of the lower extremities.  Increased motor tone is noted in the hands and wrists, lower extremities.  Sensory: Sensory testing is notable that the patient has a C5 sensory level, below this level, the patient has minimal to no sensation to pinprick, soft touch, or vibration.  Coordination: Cerebellar testing reveals that she is unable to perform finger-nose-finger and heel shin on either side.  Gait and station: The patient is unable to ambulate, she is wheelchair-bound.  Reflexes: Deep tendon reflexes are symmetric, somewhat depressed in the lower extremities.   Assessment/Plan:  1.  Spinal cord injury, C5-6 level  2.  Chronic pain syndrome  The patient has significant spasticity  involving the arms, she is referred to this office for possible Botox therapy for the hands to improve the ability to straighten the fingers.  The patient should be an excellent candidate for this, the fingers can be straightened, she does have full range of movement of the joints of the hands.  The patient will be referred to Dr. Krista Blue for this purpose.  The patient will be decreased on tizanidine taking 2 mg 3 times daily as this may be the source of her hallucinations.  The gabapentin will be increased to 600 mg 4 times daily.  The Cymbalta can be increased in the future if needed, and carbamazepine may be added for pain control.  The patient will follow-up in 4 months.  Jill Alexanders MD 05/28/2017 12:05 PM  [facility] [address] [place], [place] [address]  Phone [phone] Fax [phone]

## 2017-05-28 NOTE — Patient Instructions (Signed)
   Reduce the Tizanidine to 2 mg three times a day.  Increase the gabapentin to 600 mg 4 times a day.

## 2017-06-01 ENCOUNTER — Encounter (HOSPITAL_BASED_OUTPATIENT_CLINIC_OR_DEPARTMENT_OTHER): Payer: Medicare Other

## 2017-06-02 ENCOUNTER — Telehealth: Payer: Self-pay | Admitting: *Deleted

## 2017-06-02 NOTE — Telephone Encounter (Signed)
Faxed signed care plan back to [facility] at [phone]. Received fax confirmation.

## 2017-06-03 ENCOUNTER — Ambulatory Visit (HOSPITAL_COMMUNITY)
Admission: RE | Admit: 2017-06-03 | Discharge: 2017-06-03 | Disposition: A | Discharge status: Home / Self Care | Payer: Medicare Other | Source: Ambulatory Visit | Attending: Acute Care | Admitting: Acute Care

## 2017-06-03 DIAGNOSIS — Z930 Tracheostomy status: Secondary | ICD-10-CM

## 2017-06-03 DIAGNOSIS — Z4682 Encounter for fitting and adjustment of non-vascular catheter: Secondary | ICD-10-CM | POA: Insufficient documentation

## 2017-06-03 DIAGNOSIS — G8250 Quadriplegia, unspecified: Secondary | ICD-10-CM | POA: Diagnosis not present

## 2017-06-03 DIAGNOSIS — J449 Chronic obstructive pulmonary disease, unspecified: Secondary | ICD-10-CM | POA: Diagnosis not present

## 2017-06-03 NOTE — Progress Notes (Signed)
Tracheostomy Procedure Note  Angela Page [id number] 1970/04/05  Pre Procedure Tracheostomy Information  Trach Brand: Shiley Size: 6.0 Style: Uncuffed Secured by: Velcro   Procedure: trach change    Post Procedure Tracheostomy Information  Trach Brand: Shiley Size: 6.0 Style: Uncuffed Secured by: Velcro   Post Procedure Evaluation:  ETCO2 positive color change from yellow to purple : Yes.   Vital signs:blood pressure 97/69, pulse 80, respirations 20 and pulse oximetry 100 % Patients current condition: stable Complications: No apparent complications Trach site exam: clean, dry Wound care done: dry, sterile and 4 x 4 gauze Patient did tolerate procedure well.   Education: none  Prescription needs: none    Additional needs: none

## 2017-06-03 NOTE — Progress Notes (Signed)
[facility]   Reason for visit:   HPI: 47 year old quadriplegic female with additional history of COPD, ongoing smoking, gastric dysmotility, dysphasia, and tracheostomy dependence following prolonged hospitalization.  We have kept her tracheostomy dependent for over a year now given her frequent re-hospitalizations and need for airway clearance.  During our last visit we had switched to every 10-month visits, with her home health nursing changing her tracheostomy at the 52-month interval.  When I saw her during the last visit, her tracheostomy had migrated some, her trach collar was quite loose, and I had some difficulty placing the tracheostomy due to stoma size.  ROS General: No new fevers, chills, sick exposures HEENT: Denies sore throat, tracheostomy discharge, frequent suctioning.  Pulmonary: Denies cough, shortness of breath, chest pain, wheezing.  She still smokes. Cardiac: Denies chest pain, palpitations.  Abdomen: Still has some constipation but this is improving.  She tells me she gets full quite easily, she is concerned about her tube feeding regimen. Extremities: She is quadriplegic there is new no changes.  Dermatology: New rash, specifically over her chest and arm.  She tells me her primary care physicians caring for this. Neuro: No seizures, mental status change, no change in her quadriplegic state  Vital signs: Heart rate 80, blood pressure 97/69 respirations 20 pulse oximetry 100% with tracheostomy capped    Exam: General: 47 year old tracheostomy dependent female resting comfortably in a reclining chair she is in no acute distress HEENT: Normocephalic atraumatic.  Mucous membranes are moist.  Tracheostomy stoma is unremarkable she has a #6 cuffless tracheostomy in place her speech/phonation is good with the tracheostomy capped Pulmonary: Diminished bases, equal chest rise.  Weak cough persists this is at baseline Cardiac: Regular rate and rhythm  without murmur rub or gallop Neuro: Awake oriented no focal deficits generalized weakness with quadriplegic gross motor movement of the upper extremities Abdomen, protuberant.  Positive bowel sounds.  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 I did have some difficulty advancing the trach tube last time. It seemed as though she had developed a bit of a ledge. Think that this was due to the trach not fully being advanced. Since her last placement with the tube in correct position there was no issue.   Plan [name] 6 months.  The nursing staff will change her trach at the 3 month interval     Visit time: 34 minutes.   Erick Colace ACNP-BC [place]

## 2017-06-09 ENCOUNTER — Other Ambulatory Visit: Payer: Self-pay | Admitting: Urology

## 2017-06-09 NOTE — Telephone Encounter (Signed)
I called mom, [name] to schedule an appointment with the patient.  She is a quad with a g-tube, long standing.  Dr Lyndel Safe evaluated her when he was doc of the day.

## 2017-06-10 IMAGING — CR DG ABDOMEN ACUTE W/ 1V CHEST
4 series · 4 of 4 positions shown · non-contrast
Comparison: 02/28/2015 and previous

CLINICAL DATA: Quadriplegic Osei. Constipation. Wheezing and
coughing.

EXAM:
DG ABDOMEN ACUTE W/ 1V CHEST

[x chest ap]
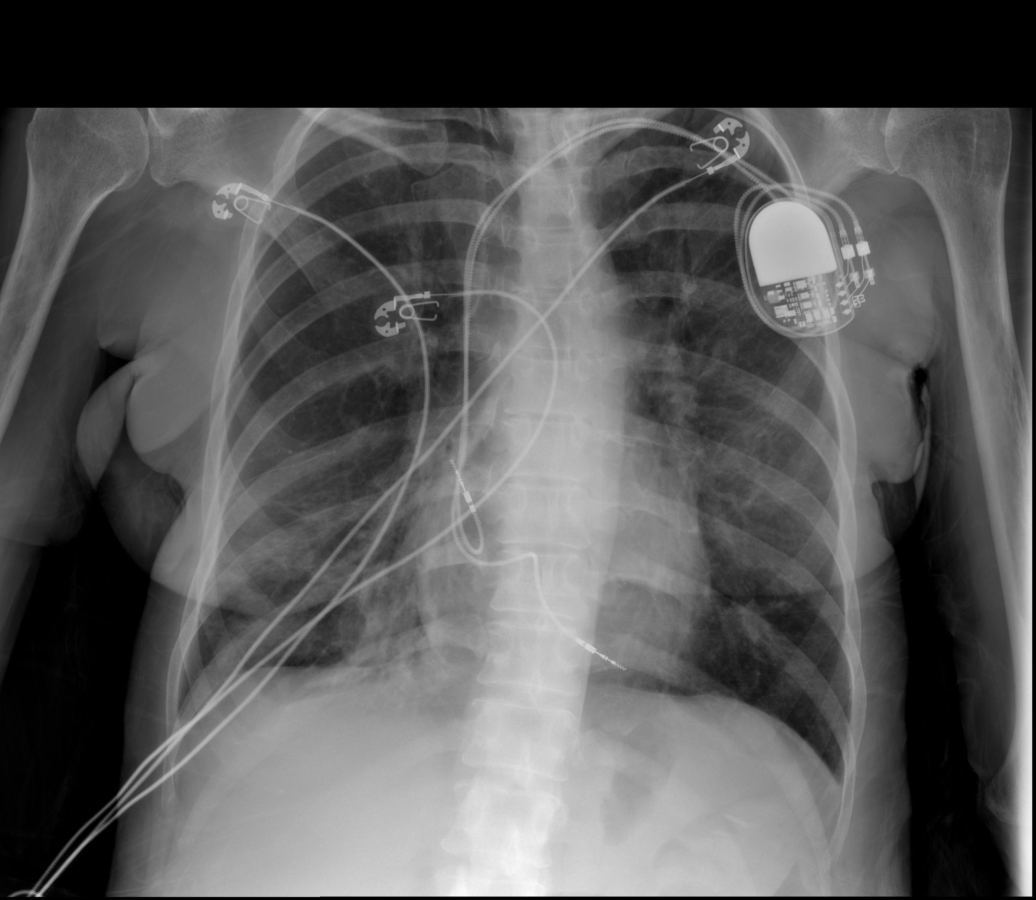

[x abdomen supine (1 of 2)]
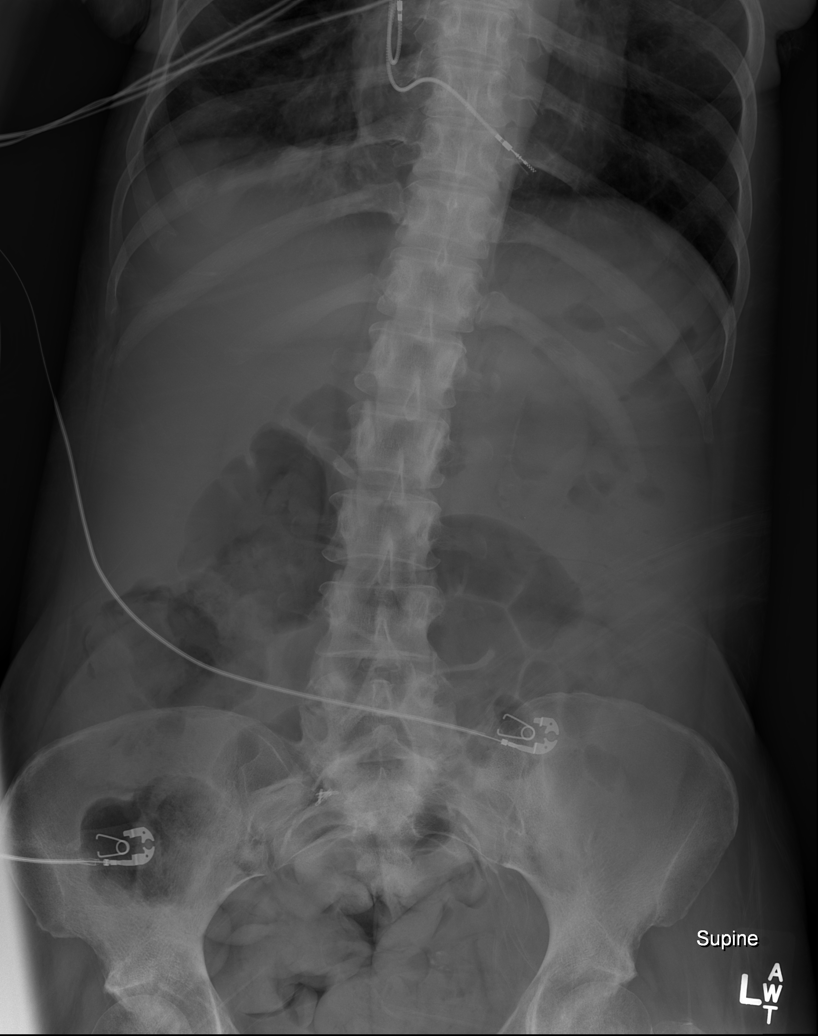

[x abdomen supine (2 of 2)]
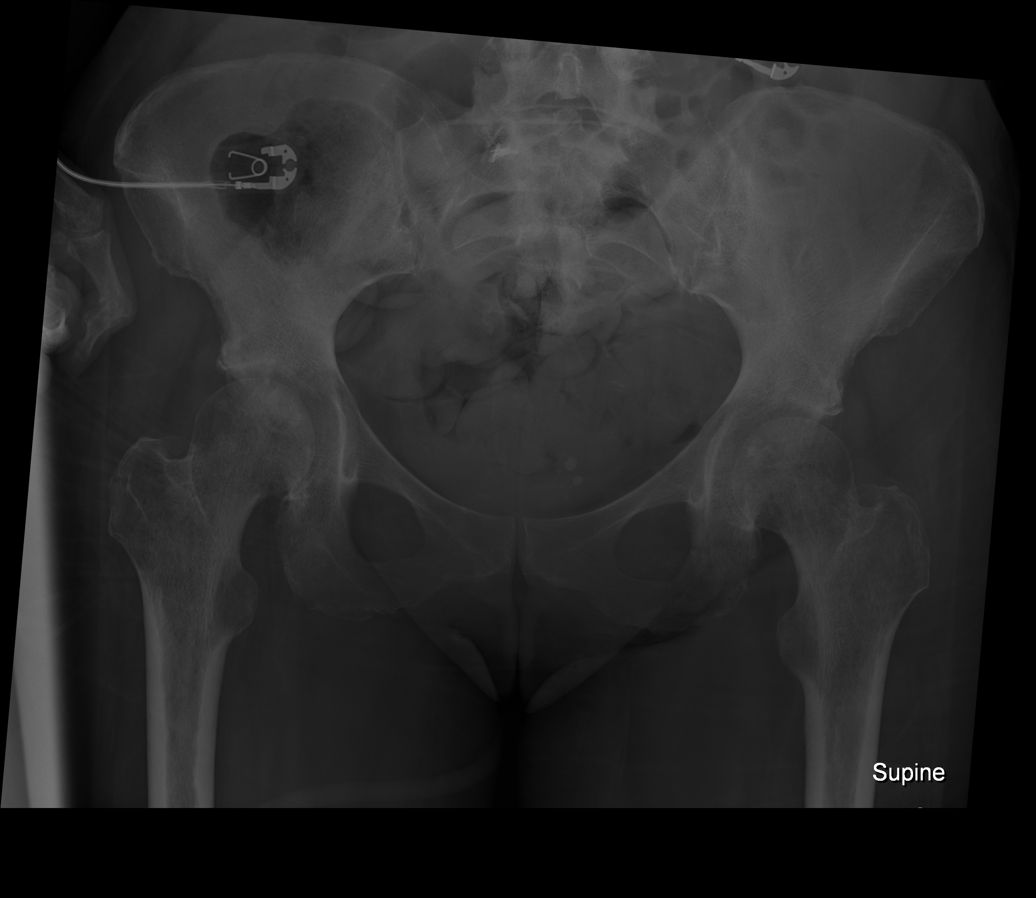

[w abdomen decub]
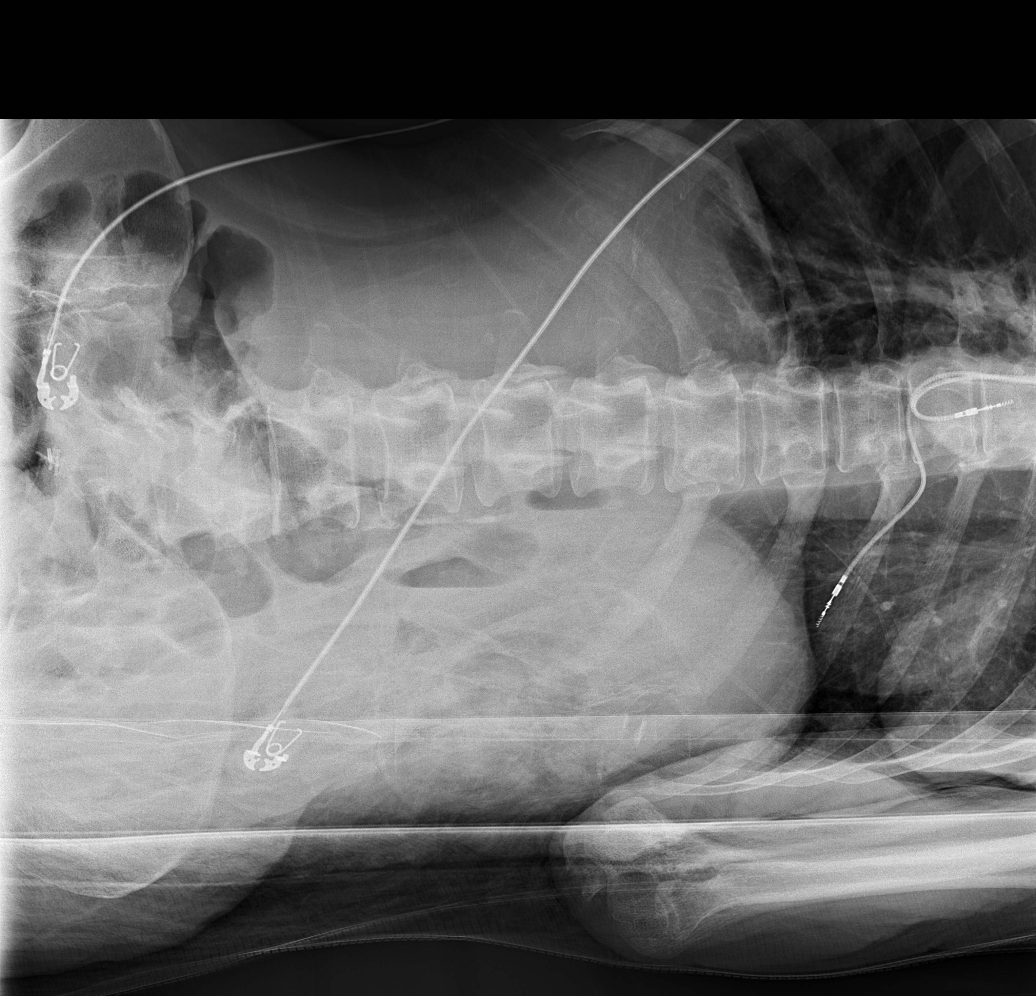

[4 of 4 positions shown; findings below may reference images not displayed]

FINDINGS: One-view chest shows partial re-expansion of the right lower lobe.
Patchy density persists in the right lower lobe and right middle
lobe that could go along with ongoing pneumonia. No free air seen
under the diaphragm. Left lung shows chronic scarring.

AP and decubitus abdominal films do not show any free
intraperitoneal air. There is a moderate to large amount of stool in
the left colon and rectosigmoid region. Small bowel gas pattern is
normal. No acute bone finding.
IMPRESSION: Partial re-expansion of the right lower lobe. Patchy density in the
right lower lobe and right middle lobe could go along with ongoing
pneumonia/atelectasis.

Moderate to large amount of stool in the rectosigmoid region and
left colon. No small bowel abnormality. No free air.

## 2017-06-12 ENCOUNTER — Telehealth: Payer: Self-pay

## 2017-06-12 NOTE — Telephone Encounter (Signed)
Received call from [facility] at [facility] patient is refusing tube feedings. Has been without for 4 days states that she feels bloated. Has follow up with GI next month. Rise Paganini would like to try bolus feedings per Dr. [name] ok to try. I have given verbal they will start with 1/2 carton 4x day and go up to 1 carton if tolerated.

## 2017-06-16 NOTE — Telephone Encounter (Signed)
Please advise ok to send in new orders?

## 2017-06-16 NOTE — Telephone Encounter (Signed)
Alice LPN w/[facility] asking for pt to be changed back to pump feeling 100cc/hr over 10 hrs using Osmolite 1.2cal. She states the bolus feedings are making it worse. Call back [phone]

## 2017-06-16 NOTE — Telephone Encounter (Signed)
Okay 

## 2017-06-16 NOTE — Telephone Encounter (Signed)
See note

## 2017-06-17 NOTE — Telephone Encounter (Signed)
Called Alice and gave verbal ok for order change per Dr. [name].

## 2017-06-18 ENCOUNTER — Encounter (HOSPITAL_COMMUNITY): Payer: Medicare Other

## 2017-06-24 ENCOUNTER — Other Ambulatory Visit: Payer: Self-pay | Admitting: Family Medicine

## 2017-06-24 ENCOUNTER — Encounter (HOSPITAL_COMMUNITY): Payer: Medicare Other

## 2017-06-24 ENCOUNTER — Encounter: Payer: Self-pay | Admitting: Family Medicine

## 2017-06-24 DIAGNOSIS — R110 Nausea: Secondary | ICD-10-CM

## 2017-06-28 ENCOUNTER — Other Ambulatory Visit: Payer: Self-pay | Admitting: Family Medicine

## 2017-06-29 ENCOUNTER — Encounter (HOSPITAL_BASED_OUTPATIENT_CLINIC_OR_DEPARTMENT_OTHER): Payer: Medicare Other | Attending: Internal Medicine

## 2017-06-29 DIAGNOSIS — L89323 Pressure ulcer of left buttock, stage 3: Secondary | ICD-10-CM | POA: Insufficient documentation

## 2017-06-29 DIAGNOSIS — I509 Heart failure, unspecified: Secondary | ICD-10-CM | POA: Diagnosis not present

## 2017-06-29 DIAGNOSIS — L89150 Pressure ulcer of sacral region, unstageable: Secondary | ICD-10-CM | POA: Diagnosis not present

## 2017-06-29 DIAGNOSIS — L89312 Pressure ulcer of right buttock, stage 2: Secondary | ICD-10-CM | POA: Diagnosis not present

## 2017-06-29 DIAGNOSIS — L89154 Pressure ulcer of sacral region, stage 4: Secondary | ICD-10-CM | POA: Insufficient documentation

## 2017-06-29 DIAGNOSIS — G8253 Quadriplegia, C5-C7 complete: Secondary | ICD-10-CM | POA: Diagnosis not present

## 2017-06-30 ENCOUNTER — Other Ambulatory Visit: Payer: Self-pay | Admitting: Family Medicine

## 2017-06-30 NOTE — Telephone Encounter (Signed)
Do not see where you have given in the past ok to refill?

## 2017-07-10 ENCOUNTER — Ambulatory Visit (HOSPITAL_COMMUNITY)
Admission: RE | Admit: 2017-07-10 | Discharge: 2017-07-10 | Disposition: A | Discharge status: Home / Self Care | Payer: Medicare Other | Source: Ambulatory Visit | Attending: Urology | Admitting: Urology

## 2017-07-10 DIAGNOSIS — R7881 Bacteremia: Secondary | ICD-10-CM | POA: Diagnosis not present

## 2017-07-10 MED ORDER — SODIUM CHLORIDE 0.9 % IV SOLN
1.0000 g | Freq: Three times a day (TID) | INTRAVENOUS | Status: AC
  Administered 2017-07-10: 1 g via INTRAVENOUS
  Filled 2017-07-10: qty 1

## 2017-07-10 NOTE — Discharge Instructions (Signed)
Meropenem injection What is this medicine? MEROPENEM (mer oh PEN em) is a carbapenem antibiotic. It is used to treat certain kinds of bacterial infections. It will not work for colds, flu, or other viral infections. This medicine may be used for other purposes; ask your health care provider or pharmacist if you have questions. COMMON BRAND NAME(S): Merrem What should I tell my health care provider before I take this medicine? They need to know if you have any of these conditions: -brain tumor -kidney disease -seizures -an unusual or allergic reaction to meropenem, other antibiotics or medicines, foods, dyes, or preservatives -pregnant or trying to get pregnant -breast-feeding How should I use this medicine? This medicine is for infusion into a vein. It is usually given by a health care professional in a hospital or clinic setting. If you get this medicine at home, you will be taught how to prepare and give this medicine. Use exactly as directed. Take your medicine at regular intervals. Do not take it more often than directed. It is important that you put your used needles and syringes in a special sharps container. Do not put them in a trash can. If you do not have a sharps container, call your pharmacist or healthcare provider to get one. Talk to your pediatrician regarding the use of this medicine in children. While this drug may be prescribed for children as young as newborns for selected conditions, precautions do apply. Overdosage: If you think you have taken too much of this medicine contact a poison control center or emergency room at once. NOTE: This medicine is only for you. Do not share this medicine with others. What if I miss a dose? If you miss a dose, use it as soon as you can. If it is almost time for your next dose, use only that dose. Do not use double or extra doses. What may interact with this medicine? -birth control pills -probenecid -valproic acid This list may not  describe all possible interactions. Give your health care provider a list of all the medicines, herbs, non-prescription drugs, or dietary supplements you use. Also tell them if you smoke, drink alcohol, or use illegal drugs. Some items may interact with your medicine. What should I watch for while using this medicine? Your condition will be monitored carefully while you are receiving this medicine. Tell your doctor or healthcare professional if your symptoms do not start to get better or if they get worse. Do not treat diarrhea with over the counter products. Contact your doctor if you have diarrhea that lasts more than 2 days or if it is severe and watery. Do not drive, use machinery, or do anything that needs mental alertness until you know how this medicine affects you. What side effects may I notice from receiving this medicine? Side effects that you should report to your doctor or health care professional as soon as possible: -allergic reactions like skin rash, itching or hives, swelling of the face, lips, or tongue -bloody or watery diarrhea -fever -fever with rash, swollen lymph nodes, or swelling of the face -pain, redness, or irritation at site where injected -pain, tingling, numbness in the hands or feet -redness, blistering, peeling or loosening of the skin, including inside the mouth -seizures -unusual bleeding or bruising Side effects that usually do not require medical attention (report to your doctor or health care professional if they continue or are bothersome): -constipation -diarrhea -dizziness -headache -nausea, vomiting -stomach pain -vaginal discharge, itching, or odor in women This list  may not describe all possible side effects. Call your doctor for medical advice about side effects. You may report side effects to FDA at 1-800-FDA-1088. Where should I keep my medicine? Keep out of the reach of children. If you are using this medicine at home, you will be instructed  on how to store this medicine. Throw away any unused medicine after the expiration date on the label. NOTE: This sheet is a summary. It may not cover all possible information. If you have questions about this medicine, talk to your doctor, pharmacist, or health care provider.  2018 Elsevier/Gold Standard (2015-09-20 14:40:17)

## 2017-07-10 NOTE — Progress Notes (Addendum)
PATIENT CARE CENTER NOTE  Provider: Dr. Diona Fanti    Procedure: IV antibiotics (Meropenem)   Note: Patient received first dose of Meropenem. Patient tolerated infusion well with no adverse reaction. IV access difficult to obtain. Patient to receive additional doses of Meropenem at home with the aid of home health agency ([facility]). [facility] manager, Richardson Landry spoke with Barbaraann Rondo from [facility] who expressed that the agency wanted the IV to stay in so home health could administer remaining antibiotic. This RN spoke with patient's doctor's office ([facility]). Dr. Diona Fanti was out of town but able to receive verbal order from Dr. Alinda Money for patient to go home with IV access.  Patient discharge with peripheral IV access. IV site clean, dry and intact at discharge. Patient alert, oriented and in stable condition at discharge.

## 2017-07-11 ENCOUNTER — Other Ambulatory Visit: Payer: Self-pay | Admitting: Family Medicine

## 2017-07-11 DIAGNOSIS — R5383 Other fatigue: Secondary | ICD-10-CM

## 2017-07-13 ENCOUNTER — Other Ambulatory Visit: Payer: Self-pay

## 2017-07-13 ENCOUNTER — Encounter (HOSPITAL_COMMUNITY): Payer: Self-pay | Admitting: *Deleted

## 2017-07-14 ENCOUNTER — Ambulatory Visit (INDEPENDENT_AMBULATORY_CARE_PROVIDER_SITE_OTHER): Payer: Medicare Other | Admitting: Gastroenterology

## 2017-07-14 ENCOUNTER — Encounter: Payer: Self-pay | Admitting: Gastroenterology

## 2017-07-14 VITALS — BP 98/70 | HR 82 | Ht 62.0 in | Wt 135.0 lb

## 2017-07-14 DIAGNOSIS — R633 Feeding difficulties, unspecified: Secondary | ICD-10-CM

## 2017-07-14 DIAGNOSIS — K5909 Other constipation: Secondary | ICD-10-CM | POA: Diagnosis not present

## 2017-07-14 MED ORDER — POLYETHYLENE GLYCOL 3350 17 GM/SCOOP PO POWD: 17.0000 g | Freq: Every day | ORAL | 3 refills | Status: DC

## 2017-07-14 MED ORDER — LINACLOTIDE 72 MCG PO CAPS: 72.0000 ug | ORAL_CAPSULE | Freq: Every day | ORAL | 0 refills | Status: DC

## 2017-07-14 MED ORDER — LINACLOTIDE 72 MCG PO CAPS: 72.0000 ug | ORAL_CAPSULE | Freq: Every day | ORAL | 11 refills | Status: DC

## 2017-07-14 MED ORDER — OMEPRAZOLE 20 MG PO CPDR: 20.0000 mg | DELAYED_RELEASE_CAPSULE | Freq: Every day | ORAL | 6 refills | Status: DC

## 2017-07-14 NOTE — Progress Notes (Signed)
Patient's mother , [name] called and asked if pateitn could take Oxycodone am of surgery along with Linzess.  Instructed patient 's mother that patient could take.  Patient's mother also asked if she could take Ambien the nite before surgery and nurse instructed mother that Ambien the nite before surgery was okay to take.

## 2017-07-14 NOTE — Patient Instructions (Addendum)
If you are age 47 or older, your body mass index should be between 23-30. Your Body mass index is 24.69 kg/m. If this is out of the aforementioned range listed, please consider follow up with your Primary Care Provider.  If you are age 46 or younger, your body mass index should be between 19-25. Your Body mass index is 24.69 kg/m. If this is out of the aformentioned range listed, please consider follow up with your Primary Care Provider.   We have sent the following medications to your pharmacy for you to pick up at your convenience: Linzess 60mcg take one capsule by mouth once daily.  Please purchase the following medications over the counter and take as directed: Miralax 17g(one capful) twice a day.  You will be scheduled for a modified barium swallow. Please arrive 15 minutes prior to your test for registration. You will go to [facility] (1st Floor) for your appointment. Please refrain from eating or drinking anything 4 hours prior to your test. Should you need to cancel or reschedule your appointment, please contact [phone] [facility]) or [phone] [place]). _____________________________________________________________________ A Modified Barium Swallow Study, or MBS, is a special x-ray that is taken to check swallowing skills. It is carried out by a Stage manager and a Psychologist, clinical (SLP). During this test, yourmouth, throat, and esophagus, a muscular tube which connects your mouth to your stomach, is checked. The test will help you, your doctor, and the SLP plan what types of foods and liquids are easier for you to swallow. The SLP will also identify positions and ways to help you swallow more easily and safely. What will happen during an MBS? You will be taken to an x-ray room and seated comfortably. You will be asked to swallow small amounts of food and liquid mixed with barium. Barium is a liquid or paste that allows images of your mouth, throat and  esophagus to be seen on x-ray. The x-ray captures moving images of the food you are swallowing as it travels from your mouth through your throat and into your esophagus. This test helps identify whether food or liquid is entering your lungs (aspiration). The test also shows which part of your mouth or throat lacks strength or coordination to move the food or liquid in the right direction. This test typically takes 30 minutes to 1 hour to complete. _______________________________________________________________________   Dennis Bast have been referred for a Speech Consultation and Dietary Consultation. They will contact you for an appointment soon.  Thank you,  Dr. Jackquline Denmark

## 2017-07-14 NOTE — Progress Notes (Signed)
Chief Complaint:   Referring Provider:  Briscoe Deutscher, DO      ASSESSMENT AND PLAN;   #1.  Feeding difficulties/PEG feeds with weight gain. -Continue current feeds for now at Osmolyte 172ml/hr x 10 hours as already been done.  She can continue to take p.o. as well.  I would recommend a dietary/nutritionist consultation for calorie count and if she can sustain p.o.. If she can sustain p.o., she would like to get off PEG feeds. - Speech consult consult to suggest p.o. consistencies and to rule out any aspiration.  She will also be scheduled for a modified barium swallow.  #2. Epigastric pain (has been on nonsteroidals) -Add omeprazole 20mg  po qd.  #3. Chronic constipation (failed and Amitiza,  Movantik) -Increase Miralax to 17g po bid  -Add linzess 72 mcg PO/PEG qd. -Minimize pain medications. -Follow-up in 3 months.  Earlier, if she still has problems.  #4.  PEG tube details -inserted by IR 11/23/2015, 20 Fr pull-through PEG.   HPI:    Angela Page is a 47 y.o. female  With paraplegia due to C5/C6 spinal cord injury following a fall in 2015.  Has a tracheostomy and a PEG tube.  She is getting PEG feeding overnight Osmolite 100 cc/h for 10 hours and has been taking p.o. with occasional problems with dysphagia.  She did have history of aspiration in the past.  She has been steadily gaining weight and does complain of that.    Also complains of some upper abdominal discomfort/pain.  Occasional regurgitation and heartburn.  Denies having any melena or hematochezia.  Has been given ibuprofen every day.  Does not want EGD.  Has abdominal bloating with significant constipation on narcotics.  She is getting MiraLAX 17 g once a day and milk of magnesia on a as needed basis with some relief.  Cannot hold enemas per nursing staff.  Lives at home and is full care.  Would like to get off PEG if possible    Past Medical History:  Diagnosis Date  . Anasarca 06/10/2016  .  Asthma   . Cocaine use 10/08/2013  . Depression   . GERD (gastroesophageal reflux disease)   . HCAP (healthcare-associated pneumonia) 06/09/2016  . Hepatitis    hx of hepatits frm mono   . Overdose of opiate or related narcotic ([place]) 09/02/2014  . Pacemaker   . Pleurisy   . Polysubstance dependence including opioid type drug, episodic abuse ([place]) 10/27/2013  . Protein calorie malnutrition ([place]) 10/31/2015  . Quadriparesis ([place])   . Quadriplegia, C5-C7 incomplete ([place]) 10/08/2013  . Stage IV pressure ulcer of sacral region ([place]) 10/31/2015    Past Surgical History:  Procedure Laterality Date  . APPENDECTOMY    . BACK SURGERY  10/08/2013   fusion of spine, cervical region  . CARDIAC SURGERY    . CYSTOSCOPY W/ URETERAL STENT PLACEMENT Right 03/09/2017   Procedure: CYSTOSCOPY [name] STENT PLACEMENT;  Surgeon: Irine Seal, MD;  Location: [facility];  Service: Urology;  Laterality: Right;  . GASTROSTOMY  11/23/2015  . I&D EXTREMITY  10/28/2011   Procedure: IRRIGATION AND DEBRIDEMENT EXTREMITY;  Surgeon: Tennis Must, MD;  Location: [facility];  Service: Orthopedics;  Laterality: Right;  Irrigation and debridement right middle finger  . INSERTION OF SUPRAPUBIC CATHETER N/A 07/28/2016   Procedure: INSERTION OF SUPRAPUBIC CATHETER;  Surgeon: Franchot Gallo, MD;  Location: [facility];  Service: Urology;  Laterality: N/A;  . IR GENERIC HISTORICAL  11/23/2015  IR GASTROSTOMY TUBE MOD SED 11/23/2015 [facility]  . TRACHEOSTOMY      Family History  Problem Relation Age of Onset  . Hypertension Maternal Uncle   . Kidney failure Maternal Uncle   . Colon cancer Mother   . Lung cancer Father   . Hypertension Brother   . Kidney failure Maternal Aunt   . Kidney failure Maternal Uncle   . Colon cancer Maternal Grandfather     Social History   Tobacco Use  . Smoking status: Current Every Day Smoker    Packs/day: 1.00    Years: 25.00    Pack years: 25.00    Types: Cigarettes    . Smokeless tobacco: Never Used  Substance Use Topics  . Alcohol use: Never    Frequency: Never  . Drug use: No    Current Outpatient Medications  Medication Sig Dispense Refill  . acetaminophen (TYLENOL) 500 MG tablet Take 1,000 mg by mouth every 6 (six) hours as needed for mild pain, moderate pain, fever or headache.     . albuterol (PROVENTIL HFA;VENTOLIN HFA) 108 (90 Base) MCG/ACT inhaler INHALE 1-2 PUFFS INTO THE LUNGS EVERY 6 (SIX) HOURS AS NEEDED FOR WHEEZING OR SHORTNESS OF BREATH. (Patient taking differently: Inhale 2 puffs into the lungs every 6 (six) hours as needed for wheezing or shortness of breath. ) 8.5 Inhaler 1  . albuterol (PROVENTIL) (2.5 MG/3ML) 0.083% nebulizer solution Take 2.5 mg by nebulization every 6 (six) hours as needed for wheezing or shortness of breath.    Marland Kitchen alum & mag hydroxide-simeth (MAALOX PLUS) 400-400-40 MG/5ML suspension Take 10-20 mLs by mouth 4 (four) times daily as needed for indigestion.    . baclofen (LIORESAL) 20 MG tablet TAKE 1 TABLET BY MOUTH 3 TIMES A DAY 90 tablet 11  . Carboxymethylcellul-Glycerin (LUBRICATING EYE DROPS OP) Place 2 drops into both eyes every 4 (four) hours as needed (dry eyes).    . [employer] MELATONIN 5 MG TABS TAKE 1 TABLET BY MOUTH AT BEDTIME 30 tablet 5  . DULoxetine (CYMBALTA) 60 MG capsule TAKE 1 CAPSULE BY MOUTH EVERY DAY 90 capsule 1  . furosemide (LASIX) 40 MG tablet Take 1 tablet (40 mg total) by mouth daily. 30 tablet 5  . gabapentin (NEURONTIN) 400 MG capsule TAKE 1 CAPSULE (400 MG TOTAL) BY MOUTH 4 (FOUR) TIMES DAILY. 120 capsule 6  . gabapentin (NEURONTIN) 600 MG tablet Take 1 tablet (600 mg total) by mouth 4 (four) times daily. 120 tablet 3  . ibuprofen (ADVIL,MOTRIN) 200 MG tablet Place 2 tablets (400 mg total) into feeding tube every 6 (six) hours as needed for moderate pain. 30 tablet 0  . lidocaine (LIDODERM) 5 % Place 1 patch onto the skin 2 (two) times daily as needed (pain). Remove & Discard patch within 12  hours or as directed by MD    . magnesium citrate SOLN Take 1 Bottle by mouth daily as needed for severe constipation.     . magnesium hydroxide (MILK OF MAGNESIA) 400 MG/5ML suspension Take 5 mLs by mouth daily as needed for mild constipation. 360 mL 0  . midodrine (PROAMATINE) 10 MG tablet PLACE 1 TABLET (10 MG TOTAL) INTO FEEDING TUBE 3 (THREE) TIMES DAILY WITH MEALS. 90 tablet 1  . Nutritional Supplements (FEEDING SUPPLEMENT, OSMOLITE 1.2 CAL,) LIQD Place 1,000 mLs into feeding tube continuous. (Patient taking differently: Place 1,000 mLs into feeding tube continuous. 100 mLs Per Hour Over a 10 Hour Period=1,000 mLs. Feeding Given In The Evening From  2300 to 0900.) 1000 mL 12  . Nutritional Supplements (PROMOD) LIQD Give 30 mLs by tube 2 (two) times daily.     Marland Kitchen nystatin (MYCOSTATIN) 100000 UNIT/ML suspension Take 5 mLs (500,000 Units total) by mouth 4 (four) times daily. (Patient taking differently: Take 500,000 Units by mouth 4 (four) times daily as needed (tongue swelling). ) 60 mL 1  . ondansetron (ZOFRAN) 4 MG tablet TAKE 1 TABLET BY MOUTH EVERY 8 HOURS AS NEEDED FOR NAUSEA AND VOMITING 20 tablet 1  . Oxycodone HCl 10 MG TABS Take 10 mg by mouth 4 (four) times daily as needed (for pain).     . OXYGEN Inhale 3 L into the lungs continuous.    Marland Kitchen oxymetazoline (AFRIN) 0.05 % nasal spray Place 2 sprays into both nostrils 3 (three) times daily as needed for congestion.    . polyethylene glycol powder (GLYCOLAX/MIRALAX) powder Take 17 g by mouth daily. (Patient taking differently: Take 17 g by mouth every evening. ) 3350 g 1  . potassium chloride SA (K-DUR,KLOR-CON) 20 MEQ tablet 1 tab bid for 1 week then take 1 tab daily after that. (Patient taking differently: Take 20 mEq by mouth daily. ) 37 tablet 3  . Skin Protectants, Misc. (BALMEX SKIN PROTECTANT EX) Apply 1 application topically 3 (three) times daily as needed (redness/irritation).     Marland Kitchen tiotropium (SPIRIVA HANDIHALER) 18 MCG inhalation  capsule Place 1 capsule (18 mcg total) into inhaler and inhale daily. 30 capsule 12  . tiZANidine (ZANAFLEX) 2 MG tablet Take 1 tablet (2 mg total) by mouth 3 (three) times daily. 90 tablet 3  . Water For Irrigation, Sterile (FREE WATER) SOLN Place 100 mLs into feeding tube every 8 (eight) hours. 1000 mL 0  . zolpidem (AMBIEN) 10 MG tablet Take 10 mg by mouth at bedtime.     No current facility-administered medications for this visit.     Allergies  Allergen Reactions  . Erythromycin Nausea And Vomiting  . Nitrofurantoin Monohyd Macro Nausea And Vomiting  . Oxybutynin     Dries mouth out   . Penicillins Hives    Has patient had a PCN reaction causing immediate rash, facial/tongue/throat swelling, SOB or lightheadedness with hypotension: Yes Has patient had a PCN reaction causing severe rash involving mucus membranes or skin necrosis: Yes Has patient had a PCN reaction that required hospitalization No Has patient had a PCN reaction occurring within the last 10 years: No-childhood allergy If all of the above answers are "NO", then may proceed with Cephalosporin     Review of Systems:  Constitutional: Denies fever, chills, Has fatigue.  Bedbound. HEENT: Has allergies. Respiratory: Denies SOB, chest tightness,  and wheezing.   Cardiovascular: Has heart murmur. Genitourinary: Has catheter. Musculoskeletal: Has arthritis and is bedbound.  Has muscle cramps and spasticity. Skin: No rash.  Neurological: Denies dizziness, seizures, syncope, weakness, light-headedness, numbness and headaches.  Hematological: Denies adenopathy. Easy bruising, personal or family bleeding history  Psychiatric/Behavioral: Has anxiety or depression     Physical Exam:    BP 98/70   Pulse 82   Ht 5\' 2"  (1.575 m)   Wt 135 lb (61.2 kg)   LMP 05/27/2016 (Within Days) Comment: Irregular periods since October 2015.  BMI 24.69 kg/m  Filed Weights   07/14/17 1100  Weight: 135 lb (61.2 kg)   Constitutional:   Well-developed, in no acute distress. Psychiatric: Normal mood and affect. Behavior is normal. HEENT: Pupils normal.  Conjunctivae are normal. No scleral icterus. Neck  has tracheostomy Cardiovascular: Normal rate, regular rhythm. No edema Pulmonary/chest: Effort normal and breath sounds normal. No wheezing, rales or rhonchi. Abdominal: Soft, nondistended. Nontender. Bowel sounds active throughout. There are no masses palpable. No hepatomegaly.  Has a PEG tube. Rectal:  defered Neurological: Alert and oriented to person place and time. Skin: Skin is warm and dry. No rashes noted.  Data Reviewed: I have personally reviewed following labs and imaging studies  CBC: CBC Latest Ref Rng & Units 04/28/2017 03/12/2017 03/11/2017  WBC 4.0 - 10.5 K/uL 11.6(H) 8.4 11.2(H)  Hemoglobin 12.0 - 15.0 g/dL 13.9 9.6(L) 9.8(L)  Hematocrit 36.0 - 46.0 % 40.1 29.1(L) 29.6(L)  Platelets 150.0 - 400.0 K/uL 415.0(H) 254 270    CMP: CMP Latest Ref Rng & Units 04/28/2017 03/11/2017 03/10/2017  Glucose 70 - 99 mg/dL 142(H) 107(H) 141(H)  BUN 6 - 23 mg/dL 21 19 16   Creatinine 0.40 - 1.20 mg/dL 0.56 0.51 0.77  Sodium 135 - 145 mEq/L 128(L) 139 141  Potassium 3.5 - 5.1 mEq/L 2.8(LL) 3.6 4.3  Chloride 96 - 112 mEq/L 69(L) 108 103  CO2 19 - 32 mEq/L 53(H) 24 26  Calcium 8.4 - 10.5 mg/dL 11.0(H) 8.5(L) 9.1  Total Protein 6.0 - 8.3 g/dL 8.6(H) 5.0(L) 6.6  Total Bilirubin 0.2 - 1.2 mg/dL 0.4 0.4 0.4  Alkaline Phos 39 - 117 U/L 108 76 66  AST 0 - 37 U/L 62(H) 36 44(H)  ALT 0 - 35 U/L 61(H) 39 38      Carmell Austria, MD 07/14/2017, 11:14 AM  Cc: Briscoe Deutscher, DO

## 2017-07-15 NOTE — Progress Notes (Signed)
Spoke with Dr Sabra Heck ( anesthesia) regarding pacemaker.  Attempted to obtain pacemaker perop orders on patient from cardiology office and patient has not been seen since 2016 per fax.  Patient had procedure in 02/2017 and no periop device orders on file in epic with that surgery.  EKG-02/2017- Sinus Rhythm.  Anesthesia aware of all of above.  No new orders given.

## 2017-07-15 NOTE — H&P (View-Only) (Signed)
H&P  Chief Complaint: Right sided kidney stone History of Present Illness: Angela Page is a 47 y.o. year old female with a history of trauma who I have seen before placement of a suprapubic tube for bladder drainage in July, 2018.  She was admitted to [facility] in February, 2019 for management of an infected, obstructing right ureteral calculus.  She was treated with meropenem for her UTI.  She has had recurrent urinary tract infections precluding urgent management of this stone.  Currently, she has just completed a 5 day course of meropenem as well as Diflucan in advance of her return to the operating room for cystoscopy, extraction of right double-J stent, ureteroscopy with holmium laser management of her right ureteral and renal calculi.  Past Medical History:  Diagnosis Date  . Anasarca 06/10/2016  . Asthma   . Cocaine use 10/08/2013  . Depression   . GERD (gastroesophageal reflux disease)   . HCAP (healthcare-associated pneumonia) 06/09/2016  . Hepatitis    hx of hepatits frm mono   . Overdose of opiate or related narcotic ([place]) 09/02/2014  . Pacemaker   . Pleurisy   . Polysubstance dependence including opioid type drug, episodic abuse ([place]) 10/27/2013  . Protein calorie malnutrition ([place]) 10/31/2015  . Quadriparesis ([place])   . Quadriplegia, C5-C7 incomplete ([place]) 10/08/2013  . Stage IV pressure ulcer of sacral region ([place]) 10/31/2015    Past Surgical History:  Procedure Laterality Date  . APPENDECTOMY    . BACK SURGERY  10/08/2013   fusion of spine, cervical region  . CARDIAC SURGERY    . CYSTOSCOPY W/ URETERAL STENT PLACEMENT Right 03/09/2017   Procedure: CYSTOSCOPY [name] STENT PLACEMENT;  Surgeon: Irine Seal, MD;  [facility];  Service: Urology;  Laterality: Right;  . GASTROSTOMY  11/23/2015  . I&D EXTREMITY  10/28/2011   Procedure: IRRIGATION AND DEBRIDEMENT EXTREMITY;  Surgeon: Tennis Must, MD;  Location: [facility];  Service:  Orthopedics;  Laterality: Right;  Irrigation and debridement right middle finger  . INSERTION OF SUPRAPUBIC CATHETER N/A 07/28/2016   Procedure: INSERTION OF SUPRAPUBIC CATHETER;  Surgeon: Franchot Gallo, MD;  [facility];  Service: Urology;  Laterality: N/A;  . IR GENERIC HISTORICAL  11/23/2015   IR GASTROSTOMY TUBE MOD SED 11/23/2015 [facility]  . TRACHEOSTOMY      Home Medications:  No medications prior to admission.    Allergies:  Allergies  Allergen Reactions  . Erythromycin Nausea And Vomiting  . Nitrofurantoin Monohyd Macro Nausea And Vomiting  . Oxybutynin     Dries mouth out   . Penicillins Hives    Has patient had a PCN reaction causing immediate rash, facial/tongue/throat swelling, SOB or lightheadedness with hypotension: Yes Has patient had a PCN reaction causing severe rash involving mucus membranes or skin necrosis: Yes Has patient had a PCN reaction that required hospitalization No Has patient had a PCN reaction occurring within the last 10 years: No-childhood allergy If all of the above answers are "NO", then may proceed with Cephalosporin     Family History  Problem Relation Age of Onset  . Hypertension Maternal Uncle   . Kidney failure Maternal Uncle   . Colon cancer Mother   . Lung cancer Father   . Hypertension Brother   . Kidney failure Maternal Aunt   . Kidney failure Maternal Uncle   . Colon cancer Maternal Grandfather     Social History:  reports that she has been smoking cigarettes.  She  has a 25.00 pack-year smoking history. She has never used smokeless tobacco. She reports that she does not drink alcohol or use drugs.  ROS: A complete review of systems was performed.  All systems are negative except for pertinent findings as noted.  Physical Exam:  Vital signs in last 24 hours:   General:  Alert and oriented, No acute distress HEENT: Normocephalic, atraumatic Neck: No JVD or lymphadenopathy Cardiovascular: Regular rate   Lungs:somewhat decreased inspiratory and expiratory excursion Abdomen: Soft, nontender, nondistended, no abdominal masses.  Suprapubic tube pre Extremities: No edema Neurologic: consistent with quadripl  Laboratory Data:  No results found for this or any previous visit (from the past 24 hour(s)). No results found for this or any previous visit (from the past 240 hour(s)). Creatinine: No results for input(s): CREATININE in the last 168 hours.  Radiologic Imaging: No results found.  Impression/Assessment:  Right upper ureteral and renal calculi, status post stenting for infection in 02/2017.  Plan:  Cystoscopy, right double-J stent extraction, ureteroscopy, holmium laser application/fragmentation, extraction of right ureteral calculi, repeat stenting.  She has been treated IV meropenem as well  [place] 07/15/2017, 5:16 PM  Lillette Boxer. Jillyn Stacey MD

## 2017-07-15 NOTE — H&P (Signed)
H&P  Chief Complaint: Right sided kidney stone History of Present Illness: Angela Page is a 47 y.o. year old female with a history of trauma who I have seen before placement of a suprapubic tube for bladder drainage in July, 2018.  She was admitted to [facility] in February, 2019 for management of an infected, obstructing right ureteral calculus.  She was treated with meropenem for her UTI.  She has had recurrent urinary tract infections precluding urgent management of this stone.  Currently, she has just completed a 5 day course of meropenem as well as Diflucan in advance of her return to the operating room for cystoscopy, extraction of right double-J stent, ureteroscopy with holmium laser management of her right ureteral and renal calculi.  Past Medical History:  Diagnosis Date  . Anasarca 06/10/2016  . Asthma   . Cocaine use 10/08/2013  . Depression   . GERD (gastroesophageal reflux disease)   . HCAP (healthcare-associated pneumonia) 06/09/2016  . Hepatitis    hx of hepatits frm mono   . Overdose of opiate or related narcotic ([place]) 09/02/2014  . Pacemaker   . Pleurisy   . Polysubstance dependence including opioid type drug, episodic abuse ([place]) 10/27/2013  . Protein calorie malnutrition ([place]) 10/31/2015  . Quadriparesis ([place])   . Quadriplegia, C5-C7 incomplete ([place]) 10/08/2013  . Stage IV pressure ulcer of sacral region ([place]) 10/31/2015    Past Surgical History:  Procedure Laterality Date  . APPENDECTOMY    . BACK SURGERY  10/08/2013   fusion of spine, cervical region  . CARDIAC SURGERY    . CYSTOSCOPY W/ URETERAL STENT PLACEMENT Right 03/09/2017   Procedure: CYSTOSCOPY [name] STENT PLACEMENT;  Surgeon: Irine Seal, MD;  Location: [facility];  Service: Urology;  Laterality: Right;  . GASTROSTOMY  11/23/2015  . I&D EXTREMITY  10/28/2011   Procedure: IRRIGATION AND DEBRIDEMENT EXTREMITY;  Surgeon: Tennis Must, MD;  Location: [facility];  Service:  Orthopedics;  Laterality: Right;  Irrigation and debridement right middle finger  . INSERTION OF SUPRAPUBIC CATHETER N/A 07/28/2016   Procedure: INSERTION OF SUPRAPUBIC CATHETER;  Surgeon: Franchot Gallo, MD;  Location: [facility];  Service: Urology;  Laterality: N/A;  . IR GENERIC HISTORICAL  11/23/2015   IR GASTROSTOMY TUBE MOD SED 11/23/2015 [facility]  . TRACHEOSTOMY      Home Medications:  No medications prior to admission.    Allergies:  Allergies  Allergen Reactions  . Erythromycin Nausea And Vomiting  . Nitrofurantoin Monohyd Macro Nausea And Vomiting  . Oxybutynin     Dries mouth out   . Penicillins Hives    Has patient had a PCN reaction causing immediate rash, facial/tongue/throat swelling, SOB or lightheadedness with hypotension: Yes Has patient had a PCN reaction causing severe rash involving mucus membranes or skin necrosis: Yes Has patient had a PCN reaction that required hospitalization No Has patient had a PCN reaction occurring within the last 10 years: No-childhood allergy If all of the above answers are "NO", then may proceed with Cephalosporin     Family History  Problem Relation Age of Onset  . Hypertension Maternal Uncle   . Kidney failure Maternal Uncle   . Colon cancer Mother   . Lung cancer Father   . Hypertension Brother   . Kidney failure Maternal Aunt   . Kidney failure Maternal Uncle   . Colon cancer Maternal Grandfather     Social History:  reports that she has been smoking cigarettes.  She  has a 25.00 pack-year smoking history. She has never used smokeless tobacco. She reports that she does not drink alcohol or use drugs.  ROS: A complete review of systems was performed.  All systems are negative except for pertinent findings as noted.  Physical Exam:  Vital signs in last 24 hours:   General:  Alert and oriented, No acute distress HEENT: Normocephalic, atraumatic Neck: No JVD or lymphadenopathy Cardiovascular: Regular rate   Lungs:somewhat decreased inspiratory and expiratory excursion Abdomen: Soft, nontender, nondistended, no abdominal masses.  Suprapubic tube pre Extremities: No edema Neurologic: consistent with quadripl  Laboratory Data:  No results found for this or any previous visit (from the past 24 hour(s)). No results found for this or any previous visit (from the past 240 hour(s)). Creatinine: No results for input(s): CREATININE in the last 168 hours.  Radiologic Imaging: No results found.  Impression/Assessment:  Right upper ureteral and renal calculi, status post stenting for infection in 02/2017.  Plan:  Cystoscopy, right double-J stent extraction, ureteroscopy, holmium laser application/fragmentation, extraction of right ureteral calculi, repeat stenting.  She has been treated IV meropenem as well  [place] 07/15/2017, 5:16 PM  Lillette Boxer. Lissandro Dilorenzo MD

## 2017-07-16 ENCOUNTER — Ambulatory Visit (HOSPITAL_COMMUNITY): Payer: Medicare Other | Admitting: Certified Registered Nurse Anesthetist

## 2017-07-16 ENCOUNTER — Observation Stay (HOSPITAL_COMMUNITY)
Admission: RE | Admit: 2017-07-16 | Discharge: 2017-07-17 | Disposition: A | Discharge status: Home / Self Care | Payer: Medicare Other | Source: Ambulatory Visit | Attending: Urology | Admitting: Urology

## 2017-07-16 ENCOUNTER — Emergency Department (HOSPITAL_COMMUNITY)
Admission: EM | Admit: 2017-07-16 | Discharge: 2017-07-16 | Discharge status: Absent without leave | Payer: Medicare Other | Source: Home / Self Care

## 2017-07-16 ENCOUNTER — Encounter (HOSPITAL_COMMUNITY)
Admission: RE | Disposition: A | Discharge status: Home / Self Care | Payer: Self-pay | Source: Ambulatory Visit | Attending: Urology

## 2017-07-16 ENCOUNTER — Encounter (HOSPITAL_COMMUNITY): Payer: Self-pay | Admitting: *Deleted

## 2017-07-16 ENCOUNTER — Other Ambulatory Visit: Payer: Self-pay

## 2017-07-16 ENCOUNTER — Ambulatory Visit (HOSPITAL_COMMUNITY): Payer: Medicare Other

## 2017-07-16 DIAGNOSIS — L89154 Pressure ulcer of sacral region, stage 4: Secondary | ICD-10-CM | POA: Insufficient documentation

## 2017-07-16 DIAGNOSIS — I495 Sick sinus syndrome: Secondary | ICD-10-CM | POA: Diagnosis not present

## 2017-07-16 DIAGNOSIS — Z888 Allergy status to other drugs, medicaments and biological substances status: Secondary | ICD-10-CM | POA: Insufficient documentation

## 2017-07-16 DIAGNOSIS — Z87440 Personal history of urinary (tract) infections: Secondary | ICD-10-CM | POA: Insufficient documentation

## 2017-07-16 DIAGNOSIS — F329 Major depressive disorder, single episode, unspecified: Secondary | ICD-10-CM | POA: Diagnosis not present

## 2017-07-16 DIAGNOSIS — Z881 Allergy status to other antibiotic agents status: Secondary | ICD-10-CM | POA: Insufficient documentation

## 2017-07-16 DIAGNOSIS — G8254 Quadriplegia, C5-C7 incomplete: Secondary | ICD-10-CM | POA: Diagnosis not present

## 2017-07-16 DIAGNOSIS — E46 Unspecified protein-calorie malnutrition: Secondary | ICD-10-CM | POA: Diagnosis not present

## 2017-07-16 DIAGNOSIS — Z950 Presence of cardiac pacemaker: Secondary | ICD-10-CM | POA: Diagnosis not present

## 2017-07-16 DIAGNOSIS — Z880 Allergy status to penicillin: Secondary | ICD-10-CM | POA: Insufficient documentation

## 2017-07-16 DIAGNOSIS — Z6823 Body mass index (BMI) 23.0-23.9, adult: Secondary | ICD-10-CM | POA: Insufficient documentation

## 2017-07-16 DIAGNOSIS — Z8249 Family history of ischemic heart disease and other diseases of the circulatory system: Secondary | ICD-10-CM | POA: Insufficient documentation

## 2017-07-16 DIAGNOSIS — Z841 Family history of disorders of kidney and ureter: Secondary | ICD-10-CM | POA: Insufficient documentation

## 2017-07-16 DIAGNOSIS — K219 Gastro-esophageal reflux disease without esophagitis: Secondary | ICD-10-CM | POA: Diagnosis not present

## 2017-07-16 DIAGNOSIS — J45909 Unspecified asthma, uncomplicated: Secondary | ICD-10-CM | POA: Insufficient documentation

## 2017-07-16 DIAGNOSIS — Z9981 Dependence on supplemental oxygen: Secondary | ICD-10-CM | POA: Insufficient documentation

## 2017-07-16 DIAGNOSIS — Z800 Family history of malignant neoplasm of digestive organs: Secondary | ICD-10-CM | POA: Diagnosis not present

## 2017-07-16 DIAGNOSIS — J449 Chronic obstructive pulmonary disease, unspecified: Secondary | ICD-10-CM | POA: Insufficient documentation

## 2017-07-16 DIAGNOSIS — N202 Calculus of kidney with calculus of ureter: Secondary | ICD-10-CM | POA: Diagnosis present

## 2017-07-16 DIAGNOSIS — F17210 Nicotine dependence, cigarettes, uncomplicated: Secondary | ICD-10-CM | POA: Insufficient documentation

## 2017-07-16 DIAGNOSIS — L8990 Pressure ulcer of unspecified site, unspecified stage: Secondary | ICD-10-CM

## 2017-07-16 DIAGNOSIS — N926 Irregular menstruation, unspecified: Secondary | ICD-10-CM | POA: Diagnosis not present

## 2017-07-16 DIAGNOSIS — N200 Calculus of kidney: Principal | ICD-10-CM

## 2017-07-16 DIAGNOSIS — Z801 Family history of malignant neoplasm of trachea, bronchus and lung: Secondary | ICD-10-CM | POA: Insufficient documentation

## 2017-07-16 DIAGNOSIS — Z87442 Personal history of urinary calculi: Secondary | ICD-10-CM | POA: Diagnosis not present

## 2017-07-16 HISTORY — DX: Major depressive disorder, single episode, unspecified: F32.9

## 2017-07-16 HISTORY — DX: Inflammatory liver disease, unspecified: K75.9

## 2017-07-16 HISTORY — PX: CYSTOSCOPY W/ URETERAL STENT REMOVAL: SHX1430

## 2017-07-16 HISTORY — DX: Depression, unspecified: F32.A

## 2017-07-16 HISTORY — PX: CYSTOSCOPY/URETEROSCOPY/HOLMIUM LASER/STENT PLACEMENT: SHX6546

## 2017-07-16 LAB — COMPREHENSIVE METABOLIC PANEL
ALBUMIN: 3 g/dL — AB (ref 3.5–5.0)
ALK PHOS: 176 U/L — AB (ref 38–126)
ALT: 43 U/L (ref 0–44)
AST: 62 U/L — AB (ref 15–41)
Anion gap: 15 (ref 5–15)
BUN: 10 mg/dL (ref 6–20)
CALCIUM: 10.1 mg/dL (ref 8.9–10.3)
CO2: 38 mmol/L — ABNORMAL HIGH (ref 22–32)
Chloride: 85 mmol/L — ABNORMAL LOW (ref 98–111)
Creatinine, Ser: 0.55 mg/dL (ref 0.44–1.00)
GFR calc Af Amer: >60 mL/min (ref >60)
GFR calc non Af Amer: >60 mL/min (ref >60)
GLUCOSE: 138 mg/dL — AB (ref 70–99)
Potassium: 3.5 mmol/L (ref 3.5–5.1)
Sodium: 138 mmol/L (ref 135–145)
Total Bilirubin: 0.4 mg/dL (ref 0.3–1.2)
Total Protein: 7.9 g/dL (ref 6.5–8.1)

## 2017-07-16 LAB — CBC
HEMATOCRIT: 41.4 % (ref 36.0–46.0)
HEMATOCRIT: 41.1 % (ref 36.0–46.0)
HEMOGLOBIN: 13.8 g/dL (ref 12.0–15.0)
HEMOGLOBIN: 13.3 g/dL (ref 12.0–15.0)
MCH: 32.3 pg (ref 26.0–34.0)
MCH: 32 pg (ref 26.0–34.0)
MCHC: 33.3 g/dL (ref 30.0–36.0)
MCHC: 32.4 g/dL (ref 30.0–36.0)
MCV: 97 fL (ref 78.0–100.0)
MCV: 98.8 fL (ref 78.0–100.0)
Platelets: 410 10*3/uL — ABNORMAL HIGH (ref 150–400)
Platelets: 369 10*3/uL (ref 150–400)
RBC: 4.27 MIL/uL (ref 3.87–5.11)
RBC: 4.16 MIL/uL (ref 3.87–5.11)
RDW: 15.8 % — ABNORMAL HIGH (ref 11.5–15.5)
RDW: 15.8 % — ABNORMAL HIGH (ref 11.5–15.5)
WBC: 10.4 10*3/uL (ref 4.0–10.5)
WBC: 21.6 10*3/uL — ABNORMAL HIGH (ref 4.0–10.5)

## 2017-07-16 LAB — CREATININE, SERUM
Creatinine, Ser: 0.78 mg/dL (ref 0.44–1.00)
GFR calc Af Amer: >60 mL/min (ref >60)

## 2017-07-16 SURGERY — CYSTOSCOPY/URETEROSCOPY/HOLMIUM LASER/STENT PLACEMENT
Anesthesia: General | Laterality: Right

## 2017-07-16 MED ORDER — LINACLOTIDE 72 MCG PO CAPS
72.0000 ug | ORAL_CAPSULE | Freq: Every day | ORAL | Status: DC
  Administered 2017-07-17: 72 ug via ORAL
  Filled 2017-07-16: qty 1

## 2017-07-16 MED ORDER — FLUCONAZOLE 100 MG PO TABS
100.0000 mg | ORAL_TABLET | Freq: Every day | ORAL | Status: DC
  Administered 2017-07-16 – 2017-07-17 (×2): 100 mg via ORAL
  Filled 2017-07-16 (×2): qty 1

## 2017-07-16 MED ORDER — OSMOLITE 1.2 CAL PO LIQD
1000.0000 mL | ORAL | Status: DC
  Filled 2017-07-16: qty 1000

## 2017-07-16 MED ORDER — ALUM & MAG HYDROXIDE-SIMETH 200-200-20 MG/5ML PO SUSP: 10.0000 mL | Freq: Four times a day (QID) | ORAL | Status: DC | PRN

## 2017-07-16 MED ORDER — MAGNESIUM HYDROXIDE 400 MG/5ML PO SUSP: 5.0000 mL | Freq: Every day | ORAL | Status: DC | PRN

## 2017-07-16 MED ORDER — BISACODYL 10 MG RE SUPP
10.0000 mg | Freq: Every day | RECTAL | Status: DC | PRN
  Administered 2017-07-16: 10 mg via RECTAL
  Filled 2017-07-16: qty 1

## 2017-07-16 MED ORDER — HYDROMORPHONE HCL 1 MG/ML IJ SOLN
0.5000 mg | INTRAMUSCULAR | Status: DC | PRN
  Administered 2017-07-16 – 2017-07-17 (×4): 1 mg via INTRAVENOUS
  Filled 2017-07-16 (×4): qty 1

## 2017-07-16 MED ORDER — LINACLOTIDE 72 MCG PO CAPS: 72.0000 ug | ORAL_CAPSULE | Freq: Every day | ORAL | Status: DC

## 2017-07-16 MED ORDER — SODIUM CHLORIDE 0.9 % IV SOLN
1.0000 g | Freq: Three times a day (TID) | INTRAVENOUS | Status: DC
  Administered 2017-07-16 – 2017-07-17 (×2): 1 g via INTRAVENOUS
  Filled 2017-07-16 (×4): qty 1

## 2017-07-16 MED ORDER — FENTANYL CITRATE (PF) 100 MCG/2ML IJ SOLN
25.0000 ug | INTRAMUSCULAR | Status: DC | PRN
  Administered 2017-07-16 (×3): 50 ug via INTRAVENOUS

## 2017-07-16 MED ORDER — MIDODRINE HCL 5 MG PO TABS
10.0000 mg | ORAL_TABLET | Freq: Three times a day (TID) | ORAL | Status: DC
  Administered 2017-07-16 – 2017-07-17 (×3): 10 mg
  Filled 2017-07-16 (×4): qty 2

## 2017-07-16 MED ORDER — 0.9 % SODIUM CHLORIDE (POUR BTL) OPTIME
TOPICAL | Status: DC | PRN
  Administered 2017-07-16: 1000 mL

## 2017-07-16 MED ORDER — SODIUM CHLORIDE 0.9 % IR SOLN
Status: DC | PRN
  Administered 2017-07-16: 6000 mL

## 2017-07-16 MED ORDER — OXYCODONE HCL 5 MG PO TABS
ORAL_TABLET | ORAL | Status: AC
  Filled 2017-07-16: qty 2

## 2017-07-16 MED ORDER — TIOTROPIUM BROMIDE MONOHYDRATE 18 MCG IN CAPS
18.0000 ug | ORAL_CAPSULE | Freq: Every day | RESPIRATORY_TRACT | Status: DC
  Filled 2017-07-16: qty 5

## 2017-07-16 MED ORDER — CARBOXYMETHYLCELLUL-GLYCERIN 0.5-0.9 % OP SOLN: 1.0000 [drp] | Freq: Three times a day (TID) | OPHTHALMIC | Status: DC | PRN

## 2017-07-16 MED ORDER — CHLORHEXIDINE GLUCONATE 0.12 % MT SOLN
15.0000 mL | Freq: Two times a day (BID) | OROMUCOSAL | Status: DC
  Administered 2017-07-17: 15 mL via OROMUCOSAL
  Filled 2017-07-16 (×2): qty 15

## 2017-07-16 MED ORDER — DEXAMETHASONE SODIUM PHOSPHATE 10 MG/ML IJ SOLN
INTRAMUSCULAR | Status: DC | PRN
  Administered 2017-07-16: 10 mg via INTRAVENOUS

## 2017-07-16 MED ORDER — OXYCODONE HCL 5 MG PO TABS
10.0000 mg | ORAL_TABLET | Freq: Once | ORAL | Status: AC
  Administered 2017-07-16: 10 mg via ORAL

## 2017-07-16 MED ORDER — FUROSEMIDE 40 MG PO TABS
40.0000 mg | ORAL_TABLET | Freq: Every day | ORAL | Status: DC
  Administered 2017-07-17: 40 mg via ORAL
  Filled 2017-07-16: qty 1

## 2017-07-16 MED ORDER — PANTOPRAZOLE SODIUM 40 MG PO TBEC
40.0000 mg | DELAYED_RELEASE_TABLET | Freq: Every day | ORAL | Status: DC
  Administered 2017-07-17: 40 mg via ORAL
  Filled 2017-07-16: qty 1

## 2017-07-16 MED ORDER — PROPOFOL 10 MG/ML IV BOLUS
INTRAVENOUS | Status: AC
  Filled 2017-07-16: qty 40

## 2017-07-16 MED ORDER — DEXAMETHASONE SODIUM PHOSPHATE 10 MG/ML IJ SOLN
INTRAMUSCULAR | Status: AC
  Filled 2017-07-16: qty 1

## 2017-07-16 MED ORDER — POLYETHYLENE GLYCOL 3350 17 G PO PACK
17.0000 g | PACK | Freq: Every day | ORAL | Status: DC
  Administered 2017-07-17: 17 g via ORAL
  Filled 2017-07-16: qty 1

## 2017-07-16 MED ORDER — ENOXAPARIN SODIUM 40 MG/0.4ML ~~LOC~~ SOLN
40.0000 mg | SUBCUTANEOUS | Status: DC
  Administered 2017-07-17: 40 mg via SUBCUTANEOUS
  Filled 2017-07-16: qty 0.4

## 2017-07-16 MED ORDER — DULOXETINE HCL 60 MG PO CPEP
60.0000 mg | ORAL_CAPSULE | Freq: Every day | ORAL | Status: DC
  Administered 2017-07-17: 60 mg via ORAL
  Filled 2017-07-16: qty 1

## 2017-07-16 MED ORDER — CIPROFLOXACIN IN D5W 400 MG/200ML IV SOLN
INTRAVENOUS | Status: AC
  Filled 2017-07-16: qty 200

## 2017-07-16 MED ORDER — LORAZEPAM 0.5 MG PO TABS
0.5000 mg | ORAL_TABLET | Freq: Four times a day (QID) | ORAL | Status: DC | PRN
  Administered 2017-07-16 (×2): 0.5 mg via ORAL
  Filled 2017-07-16 (×3): qty 1

## 2017-07-16 MED ORDER — OXYMETAZOLINE HCL 0.05 % NA SOLN: 2.0000 | Freq: Three times a day (TID) | NASAL | Status: DC | PRN

## 2017-07-16 MED ORDER — LACTATED RINGERS IV SOLN
INTRAVENOUS | Status: DC
  Administered 2017-07-16: 09:00:00 via INTRAVENOUS

## 2017-07-16 MED ORDER — CIPROFLOXACIN IN D5W 400 MG/200ML IV SOLN
400.0000 mg | Freq: Two times a day (BID) | INTRAVENOUS | Status: DC
  Administered 2017-07-16: 400 mg via INTRAVENOUS

## 2017-07-16 MED ORDER — FREE WATER
100.0000 mL | Freq: Three times a day (TID) | Status: DC
  Administered 2017-07-16 – 2017-07-17 (×2): 100 mL

## 2017-07-16 MED ORDER — ONDANSETRON HCL 4 MG/2ML IJ SOLN
INTRAMUSCULAR | Status: AC
  Filled 2017-07-16: qty 2

## 2017-07-16 MED ORDER — SODIUM CHLORIDE 0.45 % IV SOLN
INTRAVENOUS | Status: DC
  Administered 2017-07-16: 19:00:00 via INTRAVENOUS

## 2017-07-16 MED ORDER — ALBUTEROL SULFATE (2.5 MG/3ML) 0.083% IN NEBU: 2.5000 mg | INHALATION_SOLUTION | Freq: Four times a day (QID) | RESPIRATORY_TRACT | Status: DC | PRN

## 2017-07-16 MED ORDER — TIZANIDINE HCL 2 MG PO TABS
2.0000 mg | ORAL_TABLET | Freq: Three times a day (TID) | ORAL | Status: DC
  Administered 2017-07-16 – 2017-07-17 (×2): 2 mg via ORAL
  Filled 2017-07-16 (×3): qty 1

## 2017-07-16 MED ORDER — ZOLPIDEM TARTRATE 5 MG PO TABS
5.0000 mg | ORAL_TABLET | Freq: Every day | ORAL | Status: DC
  Administered 2017-07-16: 5 mg via ORAL
  Filled 2017-07-16: qty 1

## 2017-07-16 MED ORDER — FENTANYL CITRATE (PF) 100 MCG/2ML IJ SOLN
INTRAMUSCULAR | Status: AC
  Filled 2017-07-16: qty 4

## 2017-07-16 MED ORDER — PHENYLEPHRINE HCL 10 MG/ML IJ SOLN
INTRAMUSCULAR | Status: DC | PRN
  Administered 2017-07-16: 40 ug via INTRAVENOUS
  Administered 2017-07-16: 80 ug via INTRAVENOUS

## 2017-07-16 MED ORDER — BACLOFEN 20 MG PO TABS
20.0000 mg | ORAL_TABLET | Freq: Three times a day (TID) | ORAL | Status: DC
  Administered 2017-07-16 – 2017-07-17 (×2): 20 mg via ORAL
  Filled 2017-07-16 (×2): qty 1

## 2017-07-16 MED ORDER — NYSTATIN 100000 UNIT/ML MT SUSP
500000.0000 [IU] | Freq: Four times a day (QID) | OROMUCOSAL | Status: DC | PRN
Start: 2017-07-16 — End: 2017-07-17

## 2017-07-16 MED ORDER — MIDAZOLAM HCL 2 MG/2ML IJ SOLN
INTRAMUSCULAR | Status: AC
  Filled 2017-07-16: qty 2

## 2017-07-16 MED ORDER — ONDANSETRON HCL 4 MG/2ML IJ SOLN
INTRAMUSCULAR | Status: DC | PRN
  Administered 2017-07-16: 4 mg via INTRAVENOUS

## 2017-07-16 MED ORDER — ONDANSETRON HCL 8 MG PO TABS
8.0000 mg | ORAL_TABLET | Freq: Three times a day (TID) | ORAL | Status: DC | PRN
  Filled 2017-07-16: qty 1

## 2017-07-16 MED ORDER — POLYVINYL ALCOHOL 1.4 % OP SOLN: 1.0000 [drp] | OPHTHALMIC | Status: DC | PRN

## 2017-07-16 MED ORDER — MIDAZOLAM HCL 5 MG/5ML IJ SOLN
INTRAMUSCULAR | Status: DC | PRN
  Administered 2017-07-16: 1 mg via INTRAVENOUS

## 2017-07-16 MED ORDER — ORAL CARE MOUTH RINSE: 15.0000 mL | Freq: Two times a day (BID) | OROMUCOSAL | Status: DC

## 2017-07-16 MED ORDER — GABAPENTIN 300 MG PO CAPS
600.0000 mg | ORAL_CAPSULE | Freq: Four times a day (QID) | ORAL | Status: DC
  Administered 2017-07-16 – 2017-07-17 (×4): 600 mg via ORAL
  Filled 2017-07-16 (×3): qty 2

## 2017-07-16 MED ORDER — FENTANYL CITRATE (PF) 100 MCG/2ML IJ SOLN
INTRAMUSCULAR | Status: AC
  Filled 2017-07-16: qty 2

## 2017-07-16 MED ORDER — OXYCODONE HCL 5 MG PO TABS
10.0000 mg | ORAL_TABLET | Freq: Four times a day (QID) | ORAL | Status: DC | PRN
  Administered 2017-07-17 (×2): 10 mg via ORAL
  Filled 2017-07-16 (×2): qty 2

## 2017-07-16 MED ORDER — FENTANYL CITRATE (PF) 100 MCG/2ML IJ SOLN
INTRAMUSCULAR | Status: DC | PRN
  Administered 2017-07-16 (×2): 25 ug via INTRAVENOUS

## 2017-07-16 MED ORDER — PROPOFOL 500 MG/50ML IV EMUL
INTRAVENOUS | Status: DC | PRN
  Administered 2017-07-16: 50 ug/kg/min via INTRAVENOUS

## 2017-07-16 MED ORDER — GABAPENTIN 300 MG PO CAPS
ORAL_CAPSULE | ORAL | Status: AC
  Filled 2017-07-16: qty 2

## 2017-07-16 MED ORDER — POLYETHYLENE GLYCOL 3350 17 GM/SCOOP PO POWD: 17.0000 g | Freq: Every day | ORAL | Status: DC

## 2017-07-16 MED ORDER — CIPROFLOXACIN IN D5W 400 MG/200ML IV SOLN: 400.0000 mg | INTRAVENOUS | Status: DC

## 2017-07-16 MED ORDER — LIP MEDEX EX OINT
TOPICAL_OINTMENT | CUTANEOUS | Status: AC
  Administered 2017-07-16: 17:00:00
  Filled 2017-07-16: qty 7

## 2017-07-16 MED ORDER — MAGNESIUM CITRATE PO SOLN: 1.0000 | Freq: Every day | ORAL | Status: DC | PRN

## 2017-07-16 SURGICAL SUPPLY — 27 items
BAG URO CATCHER STRL LF (MISCELLANEOUS) ×3 IMPLANT
BASKET LASER NITINOL 1.9FR (BASKET) ×3 IMPLANT
BASKET ZERO TIP NITINOL 2.4FR (BASKET) IMPLANT
CATH FOLEY 2WAY SLVR  5CC 22FR (CATHETERS) ×2
CATH FOLEY 2WAY SLVR 5CC 22FR (CATHETERS) IMPLANT
CATH INTERMIT  6FR 70CM (CATHETERS) IMPLANT
CLOTH BEACON ORANGE TIMEOUT ST (SAFETY) ×3 IMPLANT
COVER FOOTSWITCH UNIV (MISCELLANEOUS) IMPLANT
COVER SURGICAL LIGHT HANDLE (MISCELLANEOUS) ×1 IMPLANT
EXTRACTOR STONE 1.7FRX115CM (UROLOGICAL SUPPLIES) ×2 IMPLANT
FIBER LASER FLEXIVA 365 (UROLOGICAL SUPPLIES) IMPLANT
FIBER LASER TRAC TIP (UROLOGICAL SUPPLIES) ×2 IMPLANT
GLOVE BIOGEL M 8.0 STRL (GLOVE) ×3 IMPLANT
GOWN STRL REUS W/ TWL XL LVL3 (GOWN DISPOSABLE) ×1 IMPLANT
GOWN STRL REUS W/TWL LRG LVL3 (GOWN DISPOSABLE) ×4 IMPLANT
GOWN STRL REUS W/TWL XL LVL3 (GOWN DISPOSABLE)
GUIDEWIRE ANG ZIPWIRE 038X150 (WIRE) ×3 IMPLANT
GUIDEWIRE STR DUAL SENSOR (WIRE) ×3 IMPLANT
IV NS 1000ML (IV SOLUTION)
IV NS 1000ML BAXH (IV SOLUTION) ×1 IMPLANT
MANIFOLD NEPTUNE II (INSTRUMENTS) ×3 IMPLANT
PACK CYSTO (CUSTOM PROCEDURE TRAY) ×3 IMPLANT
SHEATH URETERAL 12FRX28CM (UROLOGICAL SUPPLIES) ×2 IMPLANT
STENT CONTOUR 6FRX24X.038 (STENTS) ×2 IMPLANT
TUBING CONNECTING 10 (TUBING) ×2 IMPLANT
TUBING CONNECTING 10' (TUBING) ×1
TUBING UROLOGY SET (TUBING) ×3 IMPLANT

## 2017-07-16 NOTE — Transfer of Care (Signed)
Immediate Anesthesia Transfer of Care Note  Patient: Angela Page  Procedure(s) Performed: CYSTOSCOPY/URETEROSCOPY/HOLMIUM LASER/STENT PLACEMENT/ALSO STONE EXTRACTION (Right ) CYSTOSCOPY WITH STENT REMOVAL (Right )  Patient Location: PACU  Anesthesia Type:MAC  Level of Consciousness: awake, alert , oriented and patient cooperative  Airway & Oxygen Therapy: Patient Spontanous Breathing and Patient connected to face mask oxygen  Post-op Assessment: Report given to RN and Post -op Vital signs reviewed and stable  Post vital signs: Reviewed and stable  Last Vitals:  Vitals Value Taken Time  BP    Temp    Pulse    Resp    SpO2      Last Pain:  Vitals:   07/16/17 0827  PainSc: 9          Complications: No apparent anesthesia complications

## 2017-07-16 NOTE — Progress Notes (Signed)
Patient has been scheduled for a Modified Barium Swallow on 07/28/2017 at 1:00pm.

## 2017-07-16 NOTE — Interval H&P Note (Signed)
History and Physical Interval Note:  07/16/2017 8:34 AM  Angela Page  has presented today for surgery, with the diagnosis of RIGHT URETERAL CALCULUS  The various methods of treatment have been discussed with the patient and family. After consideration of risks, benefits and other options for treatment, the patient has consented to  Procedure(s): CYSTOSCOPY/URETEROSCOPY/HOLMIUM LASER/STENT PLACEMENT/ALSO STONE EXTRACTION (Right) CYSTOSCOPY WITH STENT REMOVAL (Right) as a surgical intervention .  The patient's history has been reviewed, patient examined, no change in status, stable for surgery.  I have reviewed the patient's chart and labs.  Questions were answered to the patient's satisfaction.     Angela Page Angela Page

## 2017-07-16 NOTE — Progress Notes (Signed)
Pt admitted to pacu. Unable to access chart due to accidental discharge. Admitting trying to rectify.

## 2017-07-16 NOTE — ED Triage Notes (Signed)
Pt arrives via EMS from home. Per pt and EMS: Pt is scheduled to have kidney surgery this am at 10. Pt was suppose to be here at 0730. Pt is quadriplegic and requires medical transport. [facility] refused transport to hospital with out upfront payment of 900$ so pt called EMS to bring her to the hospital for her surgery that she is scheduled to have today

## 2017-07-16 NOTE — Anesthesia Procedure Notes (Addendum)
Procedure Name: MAC Date/Time: 07/16/2017 10:14 AM Performed by: West Pugh, CRNA Pre-anesthesia Checklist: Patient identified, Emergency Drugs available, Suction available, Patient being monitored and Timeout performed Patient Re-evaluated:Patient Re-evaluated prior to induction Oxygen Delivery Method: Simple face mask Preoxygenation: Pre-oxygenation with 100% oxygen Induction Type: IV induction Placement Confirmation: positive ETCO2 Dental Injury: Teeth and Oropharynx as per pre-operative assessment

## 2017-07-16 NOTE — Anesthesia Postprocedure Evaluation (Signed)
Anesthesia Post Note  Patient: Angela Page  Procedure(s) Performed: CYSTOSCOPY/URETEROSCOPY/HOLMIUM LASER/STENT PLACEMENT/ALSO STONE EXTRACTION (Right ) CYSTOSCOPY WITH STENT REMOVAL (Right )     Patient location during evaluation: PACU Anesthesia Type: MAC Level of consciousness: awake Pain management: pain level controlled Vital Signs Assessment: post-procedure vital signs reviewed and stable Respiratory status: spontaneous breathing, nonlabored ventilation, respiratory function stable and patient connected to nasal cannula oxygen Cardiovascular status: blood pressure returned to baseline and stable Postop Assessment: no apparent nausea or vomiting Anesthetic complications: no    Last Vitals:  Vitals:   07/16/17 2012 07/16/17 2039  BP:  116/77  Pulse: 85 (!) 101  Resp: 17 18  Temp:  37.6 C  SpO2: 98% 98%    Last Pain:  Vitals:   07/16/17 2053  TempSrc:   PainSc: 9                  Patrica Mendell P Blondie Riggsbee

## 2017-07-16 NOTE — ED Notes (Signed)
Bed: WA18 Expected date:  Expected time:  Means of arrival:  Comments: EMS-abdominal pain 

## 2017-07-16 NOTE — Anesthesia Preprocedure Evaluation (Addendum)
Anesthesia Evaluation  Patient identified by MRN, date of birth, ID band Patient awake    Reviewed: Allergy & Precautions, NPO status , Patient's Chart, lab work & pertinent test results  Airway Mallampati: Trach  TM Distance: >3 FB Neck ROM: Full   Comment: 6.0 uncuffed trach Dental  (+) Poor Dentition, Missing   Pulmonary asthma , COPD,  COPD inhaler and oxygen dependent, Current Smoker,  Trach  3L [place]    Pulmonary exam normal breath sounds clear to auscultation       Cardiovascular Normal cardiovascular exam+ pacemaker  Rhythm:Regular Rate:Normal  ECG: SR, rate 84  ECHO: LV EF: 55% -   60%   H/O sinus sick syndrome   Neuro/Psych PSYCHIATRIC DISORDERS Depression Quadriplegia, C5-C7 incomplete     GI/Hepatic GERD  Controlled,(+)     substance abuse  ,   Endo/Other  negative endocrine ROS  Renal/GU negative Renal ROS     Musculoskeletal negative musculoskeletal ROS (+) Stage IV pressure ulcer of sacral region   Abdominal   Peds  Hematology negative hematology ROS (+)   Anesthesia Other Findings RIGHT URETERAL CALCULUS  Reproductive/Obstetrics                            Anesthesia Physical Anesthesia Plan  ASA: IV  Anesthesia Plan: MAC   Post-op Pain Management:    Induction: Intravenous  PONV Risk Score and Plan: 1 and Ondansetron, Dexamethasone and Treatment may vary due to age or medical condition  Airway Management Planned: Tracheostomy and Natural Airway  Additional Equipment:   Intra-op Plan:   Post-operative Plan:   Informed Consent: I have reviewed the patients History and Physical, chart, labs and discussed the procedure including the risks, benefits and alternatives for the proposed anesthesia with the patient or authorized representative who has indicated his/her understanding and acceptance.   Dental advisory given  Plan Discussed with: CRNA  Anesthesia  Plan Comments:        Anesthesia Quick Evaluation

## 2017-07-17 ENCOUNTER — Other Ambulatory Visit (HOSPITAL_COMMUNITY): Payer: Self-pay | Admitting: Gastroenterology

## 2017-07-17 ENCOUNTER — Encounter (HOSPITAL_COMMUNITY): Payer: Self-pay | Admitting: Urology

## 2017-07-17 DIAGNOSIS — L8990 Pressure ulcer of unspecified site, unspecified stage: Secondary | ICD-10-CM

## 2017-07-17 DIAGNOSIS — R1310 Dysphagia, unspecified: Secondary | ICD-10-CM

## 2017-07-17 DIAGNOSIS — N200 Calculus of kidney: Secondary | ICD-10-CM | POA: Diagnosis not present

## 2017-07-17 LAB — HIV ANTIBODY (ROUTINE TESTING W REFLEX): HIV SCREEN 4TH GENERATION: NONREACTIVE

## 2017-07-17 MED ORDER — FLUCONAZOLE 100 MG PO TABS: 100.0000 mg | ORAL_TABLET | Freq: Every day | ORAL | 0 refills | Status: AC

## 2017-07-17 MED ORDER — CIPROFLOXACIN HCL 250 MG PO TABS: 250.0000 mg | ORAL_TABLET | Freq: Two times a day (BID) | ORAL | 0 refills | Status: DC

## 2017-07-17 NOTE — Discharge Summary (Signed)
Patient ID: Angela Page MRN: [id number] DOB/AGE: [birth year] 47 y.o.  Admit date: 07/16/2017 Discharge date: 07/17/2017  Primary Care Physician:  Briscoe Deutscher, DO  Discharge Diagnoses: Right renal calculi    Consults:  none   Discharge Medications:    Significant Diagnostic Studies:  Dg C-arm 1-60 Min-no Report  Result Date: 07/16/2017 Fluoroscopy was utilized by the requesting physician.  No radiographic interpretation.    Brief H and P: For complete details please refer to admission H and P, but in brief patient was admitted to the hospital for ureteroscopic management of right renal calculi.  Hospital Course: Postoperatively, the patient had an uncomplicated course.  She tolerated a regular diet, had stable vital signs, and was discharged on postoperative day #1. Active Problems:   Kidney stone   Pressure injury of skin   Day of Discharge BP 98/66 (BP Location: Left Arm)   Pulse 91   Temp 99.9 F (37.7 C) (Oral)   Resp 20   Ht 5\' 2"  (1.575 m)   Wt 58.1 kg (128 lb 1.4 oz)   LMP 05/27/2016 (Within Days) Comment: Irregular periods since October 2015.  SpO2 97%   BMI 23.43 kg/m   Results for orders placed or performed during the hospital encounter of 07/16/17 (from the past 24 hour(s))  HIV antibody (Routine Testing)     Status: None   Collection Time: 07/16/17  4:50 PM  Result Value Ref Range   HIV Screen 4th Generation wRfx Non Reactive Non Reactive  CBC     Status: Abnormal   Collection Time: 07/16/17  4:50 PM  Result Value Ref Range   WBC 21.6 (H) 4.0 - 10.5 K/uL   RBC 4.16 3.87 - 5.11 MIL/uL   Hemoglobin 13.3 12.0 - 15.0 g/dL   HCT 41.1 36.0 - 46.0 %   MCV 98.8 78.0 - 100.0 fL   MCH 32.0 26.0 - 34.0 pg   MCHC 32.4 30.0 - 36.0 g/dL   RDW 15.8 (H) 11.5 - 15.5 %   Platelets 369 150 - 400 K/uL  Creatinine, serum     Status: None   Collection Time: 07/16/17  4:50 PM  Result Value Ref Range   Creatinine, Ser 0.78 0.44 - 1.00 mg/dL   GFR calc  non Af Amer >60 >60 mL/min   GFR calc Af Amer >60 >60 mL/min    Physical Exam: General: Alert and awake oriented x3 not in any acute distress. HEENT: anicteric sclera, pupils reactive to light and accommodation CVS: S1-S2 clear no murmur rubs or gallops Chest: clear to auscultation bilaterally, no wheezing rales or rhonchi Abdomen: soft nontender, nondistended, normal bowel sounds, no organomegaly Extremities: no cyanosis, clubbing or edema noted bilaterally Neuro: Cranial nerves II-XII intact, no focal neurological deficits  Disposition: Home  Diet: No restrictions  Activity: Per normal   Disposition and Follow-up:  We will call for follow-up  TESTS THAT NEED FOLLOW-UP 10 minutes  DISCHARGE FOLLOW-UP Follow-up Information    Franchot Gallo, MD Follow up.   Specialty:  Urology Why:  we'll cal you to set up followup Contact information: [address] [address] [phone]           Time spent on Discharge: 10 minutes  Signed: Lillette Boxer Pietra Zuluaga 07/17/2017, 7:33 AM

## 2017-07-17 NOTE — Consult Note (Signed)
[facility] Nurse wound consult note Reason for Consult:Patient is known to me from previous admissions for Stage 3 and Stage 4 pressure injuries on the sacrum, coccygeal and right ischial tuberosity.  All wounds are healed at this time and this is a credit to the diligence and commitment of the patient, her home care nurses and aides and in hospital care providers. Patient is aware that ulcerations will never have the same tensile strength of her previous (original)  tissue and that the turning, repositioning, preventive dressings and attention to nutrition will be a part of her perpetual care plan. Wound type:Pressure Pressure Injury POA: Yes Measurement: N/A Wound bed:N/A Site of previous pressure injuries with scarring and contraction. Drainage (amount, consistency, odor) None Periwound:with evidence of previous wound healing, some moisture accumulation, but no maceration. Dressing procedure/placement/frequency: Patient is on a mattress replacement with low air loss feature and her heels are floated.  She is wearing prophylactic dressing (silicone foam) to the bilateral heels, sacrum and right IT.  Patient is for discharge today to home and will resume care by her home providers with follow up and oversight by her PCP. [facility] [place] nursing team will not follow, but will remain available to this patient, the nursing and medical teams.  Please re-consult if needed. Thanks, Maudie Flakes, MSN, RN, [place], Arther Abbott  Pager# [phone]

## 2017-07-17 NOTE — Discharge Instructions (Signed)
POSTOPERATIVE CARE AFTER URETEROSCOPY ° °Stent management ° °*Stents are often left in after ureteroscopy and stone treatment. If left in, they often cause urinary frequency, urgency, occasional blood in the urine, as well as flank discomfort with urination. These are all expected issues, and should resolve after the stent is removed. ° ° °Diet ° °Once you have adequately recovered from anesthesia, you may gradually advance your diet, as tolerated, to your regular diet. ° °Activities ° °You may gradually increase your activities to your normal unrestricted level the day following your procedure. ° °Medications ° °You should resume all preoperative medications. If you are on aspirin-like compounds, you should not resume these until the blood clears from your urine. If given an antibiotic by the surgeon, take these until they are completed. You may also be given, if you have a stent, medications to decrease the urinary frequency and urgency. ° °Pain ° °After ureteroscopy, there may be some pain on the side of the scope. Take your pain medicine for this. Usually, this pain resolves within a day or 2. ° °Fever ° °Please report any fever over 100° to the doctor. ° °

## 2017-07-17 NOTE — Care Management Note (Signed)
Case Management Note  Patient Details  Name: Angela Page MRN: [id number] Date of Birth: 05/01/1970  Subjective/Objective:  Pt admitted with Kidney [name]                   Action/Plan: Pt is active with [facility] and will continue   Expected Discharge Date:  07/17/17               Expected Discharge Plan:  [place]  In-House Referral:     Discharge planning Services  CM Consult, Other - See comment(transportation)  Post Acute Care Choice:    Choice offered to:  Patient  DME Arranged:    DME Agency:     HH Arranged:  RN, Nurse's Aide [place] Agency:  [place]  Status of Service:  Completed, signed off  If discussed at [place] of Stay Meetings, dates discussed:    Additional CommentsPurcell Mouton, RN 07/17/2017, 12:09 PM

## 2017-07-17 NOTE — Progress Notes (Signed)
Pt refused tube feedings this evening. Pt states she feels full and constipated. Pt has not had bowel movement in five days. Abdomen noted to be distended. PRN dulcolax suppository given. PEG tube flushed with 100 ml free water. Will continue to monitor closely.

## 2017-07-17 NOTE — Progress Notes (Signed)
[facility] was called for 1300 transportation to home.

## 2017-07-17 NOTE — Op Note (Signed)
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  She presented in February of this year with sepsis secondary to an obstructing, infected right UPJ stone.  Urgent stenting was performed by Dr. Cherene Julian.  She has been treated recently with IV meropenem in advance of her ureteroscopy.  She does have suprapubic tube and history of urinary tract infections.  Patient has been counseled on management of her right renal calculi (she actually has 2, a 9 mm and 7 mm).  She is aware of risks and complications including recurrent infection, ureteral injury, need for hospitalization, at least overnight due to her medical condition.  She understands these and desires to proceed.  Description of procedure: The patient was properly identified and marked in the holding area.  She received preoperative IV antibiotics.  She was taken to the operating room where significant sedation was then performed (she is quadriplegic with no sensation below her chest level).  She was placed in the dorsolithotomy position.  All pressure points and extremities were padded appropriately.  Genitalia and perineum were prepped and draped.  Proper timeout was performed.  The bladder was then inspected using a 21 [place] panendoscope.  There was a suprapubic tube present.  The right ureteral stent was then grasped and brought out partially through the ureteral orifice and was then cannulated with a sensor tip guidewire.  This was advanced into the  right upper pole calyceal system and the stent totally removed.  I then dilated the ureter with the obturator of the 12/14 ureteral access catheter.  The access catheter was then easily placed.  The guidewire and obturator were removed.  A flexible ureteroscope was then advanced into the pyelocalyceal system.  2 stones were identified.  They were fragmented using the 200 m fiber set at a power of 0.8 J and 30 Hz.  They were recommended to multiple smaller fragments which were then easily grasped and brought through the ureteral access catheter.  Careful inspection of the entire pyelocalyceal system revealed no further stone fragments save for tiny fragments remaining.  These could not be easily grasped with the engage basket.  I then replaced the sensor tip guidewire, remove the access catheter, and using fluoroscopic and cystoscopic guidance placed a 24 cm x 6 French contour double-J stent.  Adequate proximal and distal curls were seen after removal of the guidewire.  At this point, the scope was removed.  I replaced the patient's old suprapubic tube with a 22 French 10 cc balloon Foley catheter.  This was hooked to dependent drainage.  At this point, the procedure was terminated.  Patient was fully awakened and and then taken to the PACU in stable condition, having tolerated the procedure well.

## 2017-07-19 ENCOUNTER — Other Ambulatory Visit: Payer: Self-pay | Admitting: Family Medicine

## 2017-07-20 ENCOUNTER — Telehealth: Payer: Self-pay | Admitting: Gastroenterology

## 2017-07-20 NOTE — Telephone Encounter (Signed)
Kim, [facility]etic[facility] from [facility] needs to speak with you regarding pt. Pls call her at [phone].

## 2017-07-21 NOTE — Telephone Encounter (Signed)
Spoke to [employer] from [place] she stated she has called the patient and left a message for her Mother to call her back. She plans to remove the patients PEG tube and see what she can tolerate for a week or two and give me a call back.

## 2017-07-27 ENCOUNTER — Encounter (HOSPITAL_BASED_OUTPATIENT_CLINIC_OR_DEPARTMENT_OTHER): Payer: Medicare Other | Attending: Internal Medicine

## 2017-07-27 DIAGNOSIS — G8253 Quadriplegia, C5-C7 complete: Secondary | ICD-10-CM | POA: Insufficient documentation

## 2017-07-27 DIAGNOSIS — L89312 Pressure ulcer of right buttock, stage 2: Secondary | ICD-10-CM | POA: Insufficient documentation

## 2017-07-27 DIAGNOSIS — L89154 Pressure ulcer of sacral region, stage 4: Secondary | ICD-10-CM | POA: Diagnosis present

## 2017-07-27 DIAGNOSIS — I509 Heart failure, unspecified: Secondary | ICD-10-CM | POA: Diagnosis not present

## 2017-07-28 ENCOUNTER — Ambulatory Visit (HOSPITAL_COMMUNITY)
Admit: 2017-07-28 | Discharge: 2017-07-28 | Disposition: A | Discharge status: Home / Self Care | Payer: Medicare Other | Attending: Gastroenterology | Admitting: Gastroenterology

## 2017-07-28 ENCOUNTER — Ambulatory Visit (HOSPITAL_COMMUNITY): Payer: Medicare Other

## 2017-07-28 DIAGNOSIS — R633 Feeding difficulties, unspecified: Secondary | ICD-10-CM

## 2017-07-28 DIAGNOSIS — K5909 Other constipation: Secondary | ICD-10-CM

## 2017-07-29 ENCOUNTER — Telehealth: Payer: Self-pay | Admitting: Family Medicine

## 2017-07-29 NOTE — Telephone Encounter (Signed)
See note.   Copied from [place] [id number]. Topic: Inquiry >> Jun 29, 2017 12:53 PM Scherrie Gerlach wrote: Reason for CRM: Gaspar Bidding with [facility] calling to follow up on on orderrs they never got back. When I confirmed the fax number he had, it was not your fax number. Gaspar Bidding going to refax, Aquilla Solian and hopes you can expedite. (These are old orders)  >> Jul 15, 2017  7:26 AM Durwin Glaze, CMA wrote: I have not seen any papers come over.  >> Jul 29, 2017 10:08 AM Oneta Rack wrote: Osvaldo Human name: Gaspar Bidding  Relation to pt: Director at [facility]  Call back number: [phone]    Reason for call:  2nd request for plan of care orders, orders faxed on 06/29/17. Re faxing orders today to [phone]. Please fax back to [facility] [phone].

## 2017-07-29 NOTE — Telephone Encounter (Signed)
This was faxed on 07/28/17. I have reffaxed again today.

## 2017-07-31 ENCOUNTER — Other Ambulatory Visit (HOSPITAL_COMMUNITY): Payer: Self-pay | Admitting: Gastroenterology

## 2017-07-31 ENCOUNTER — Other Ambulatory Visit: Payer: Self-pay

## 2017-07-31 DIAGNOSIS — R1310 Dysphagia, unspecified: Secondary | ICD-10-CM

## 2017-08-03 DIAGNOSIS — I495 Sick sinus syndrome: Secondary | ICD-10-CM

## 2017-08-03 DIAGNOSIS — Z931 Gastrostomy status: Secondary | ICD-10-CM

## 2017-08-03 DIAGNOSIS — L89324 Pressure ulcer of left buttock, stage 4: Secondary | ICD-10-CM

## 2017-08-03 DIAGNOSIS — Z930 Tracheostomy status: Secondary | ICD-10-CM

## 2017-08-03 DIAGNOSIS — I959 Hypotension, unspecified: Secondary | ICD-10-CM

## 2017-08-03 DIAGNOSIS — M4322 Fusion of spine, cervical region: Secondary | ICD-10-CM | POA: Diagnosis not present

## 2017-08-03 DIAGNOSIS — J45998 Other asthma: Secondary | ICD-10-CM

## 2017-08-03 DIAGNOSIS — Z436 Encounter for attention to other artificial openings of urinary tract: Secondary | ICD-10-CM

## 2017-08-03 DIAGNOSIS — G8254 Quadriplegia, C5-C7 incomplete: Secondary | ICD-10-CM | POA: Diagnosis not present

## 2017-08-03 DIAGNOSIS — E46 Unspecified protein-calorie malnutrition: Secondary | ICD-10-CM

## 2017-08-03 DIAGNOSIS — Z87891 Personal history of nicotine dependence: Secondary | ICD-10-CM

## 2017-08-03 DIAGNOSIS — K567 Ileus, unspecified: Secondary | ICD-10-CM

## 2017-08-03 DIAGNOSIS — Z950 Presence of cardiac pacemaker: Secondary | ICD-10-CM

## 2017-08-03 DIAGNOSIS — N390 Urinary tract infection, site not specified: Secondary | ICD-10-CM | POA: Diagnosis not present

## 2017-08-03 DIAGNOSIS — L89154 Pressure ulcer of sacral region, stage 4: Secondary | ICD-10-CM | POA: Diagnosis not present

## 2017-08-03 DIAGNOSIS — I5020 Unspecified systolic (congestive) heart failure: Secondary | ICD-10-CM

## 2017-08-10 ENCOUNTER — Other Ambulatory Visit (HOSPITAL_COMMUNITY): Payer: Self-pay | Admitting: Gastroenterology

## 2017-08-10 ENCOUNTER — Encounter (HOSPITAL_COMMUNITY): Payer: Self-pay

## 2017-08-10 ENCOUNTER — Ambulatory Visit (HOSPITAL_COMMUNITY)
Admission: RE | Admit: 2017-08-10 | Discharge: 2017-08-10 | Disposition: A | Discharge status: Home / Self Care | Payer: Medicare Other | Source: Ambulatory Visit | Attending: Gastroenterology | Admitting: Gastroenterology

## 2017-08-10 DIAGNOSIS — R1310 Dysphagia, unspecified: Secondary | ICD-10-CM | POA: Diagnosis not present

## 2017-08-10 DIAGNOSIS — J45909 Unspecified asthma, uncomplicated: Secondary | ICD-10-CM | POA: Diagnosis not present

## 2017-08-10 DIAGNOSIS — F329 Major depressive disorder, single episode, unspecified: Secondary | ICD-10-CM | POA: Insufficient documentation

## 2017-08-10 DIAGNOSIS — G8254 Quadriplegia, C5-C7 incomplete: Secondary | ICD-10-CM | POA: Insufficient documentation

## 2017-08-10 DIAGNOSIS — K219 Gastro-esophageal reflux disease without esophagitis: Secondary | ICD-10-CM | POA: Insufficient documentation

## 2017-08-10 DIAGNOSIS — Z950 Presence of cardiac pacemaker: Secondary | ICD-10-CM | POA: Insufficient documentation

## 2017-08-12 ENCOUNTER — Other Ambulatory Visit: Payer: Self-pay | Admitting: Family Medicine

## 2017-08-12 DIAGNOSIS — R110 Nausea: Secondary | ICD-10-CM

## 2017-08-20 HISTORY — PX: LITHOTRIPSY: SUR834

## 2017-08-27 ENCOUNTER — Encounter (HOSPITAL_BASED_OUTPATIENT_CLINIC_OR_DEPARTMENT_OTHER): Payer: Medicare Other | Attending: Internal Medicine

## 2017-08-27 DIAGNOSIS — F17210 Nicotine dependence, cigarettes, uncomplicated: Secondary | ICD-10-CM | POA: Insufficient documentation

## 2017-08-27 DIAGNOSIS — L89312 Pressure ulcer of right buttock, stage 2: Secondary | ICD-10-CM | POA: Diagnosis not present

## 2017-08-27 DIAGNOSIS — X58XXXS Exposure to other specified factors, sequela: Secondary | ICD-10-CM | POA: Insufficient documentation

## 2017-08-27 DIAGNOSIS — G8253 Quadriplegia, C5-C7 complete: Secondary | ICD-10-CM | POA: Diagnosis not present

## 2017-08-27 DIAGNOSIS — L89154 Pressure ulcer of sacral region, stage 4: Secondary | ICD-10-CM | POA: Insufficient documentation

## 2017-08-27 DIAGNOSIS — Z950 Presence of cardiac pacemaker: Secondary | ICD-10-CM | POA: Diagnosis not present

## 2017-08-27 DIAGNOSIS — I495 Sick sinus syndrome: Secondary | ICD-10-CM | POA: Insufficient documentation

## 2017-08-27 DIAGNOSIS — S14105S Unspecified injury at C5 level of cervical spinal cord, sequela: Secondary | ICD-10-CM | POA: Diagnosis not present

## 2017-09-03 ENCOUNTER — Other Ambulatory Visit: Payer: Self-pay | Admitting: Family Medicine

## 2017-09-21 ENCOUNTER — Other Ambulatory Visit: Payer: Self-pay | Admitting: Neurology

## 2017-09-24 ENCOUNTER — Encounter (HOSPITAL_BASED_OUTPATIENT_CLINIC_OR_DEPARTMENT_OTHER): Payer: Medicare Other | Attending: Internal Medicine

## 2017-09-24 DIAGNOSIS — G8253 Quadriplegia, C5-C7 complete: Secondary | ICD-10-CM | POA: Insufficient documentation

## 2017-09-24 DIAGNOSIS — L89312 Pressure ulcer of right buttock, stage 2: Secondary | ICD-10-CM | POA: Insufficient documentation

## 2017-09-24 DIAGNOSIS — L89154 Pressure ulcer of sacral region, stage 4: Secondary | ICD-10-CM | POA: Diagnosis not present

## 2017-09-29 ENCOUNTER — Encounter: Payer: Self-pay | Admitting: Neurology

## 2017-09-29 ENCOUNTER — Ambulatory Visit (INDEPENDENT_AMBULATORY_CARE_PROVIDER_SITE_OTHER): Payer: Medicare Other | Admitting: Neurology

## 2017-09-29 VITALS — HR 80

## 2017-09-29 DIAGNOSIS — G8254 Quadriplegia, C5-C7 incomplete: Secondary | ICD-10-CM | POA: Diagnosis not present

## 2017-09-29 DIAGNOSIS — R5383 Other fatigue: Secondary | ICD-10-CM

## 2017-09-29 DIAGNOSIS — M792 Neuralgia and neuritis, unspecified: Secondary | ICD-10-CM | POA: Diagnosis not present

## 2017-09-29 MED ORDER — DULOXETINE HCL 60 MG PO CPEP: 60.0000 mg | ORAL_CAPSULE | Freq: Two times a day (BID) | ORAL | 1 refills | Status: DC

## 2017-09-29 NOTE — Progress Notes (Signed)
Reason for visit: Spastic quadriparesis  Angela Page is an 47 y.o. 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  The patient has no function of the legs, she is able to flex the arm some, she has minimal use of the hands on either side.  She has significant underlying pain, she is on opiate medications through a pain center, she has been on gabapentin and Cymbalta.  The patient actually has had an improvement in her pain when she switched from an air suspension bed to an air mattress, the heat and vibration of the air suspension bed increased her pain significantly.  The patient does not feed herself, she is now using splints for the hands to help keep the fingers extended.  She does not feel that she wishes to undergo Botox therapy as previously recommended.  She returns for an evaluation.  Past Medical History:  Diagnosis Date  . Anasarca 06/10/2016  . Asthma   . Cocaine use 10/08/2013  . Depression   . GERD (gastroesophageal reflux disease)   . HCAP (healthcare-associated pneumonia) 06/09/2016  . Hepatitis    hx of hepatits frm mono   . Overdose of opiate or related narcotic ([place]) 09/02/2014  . Pacemaker   . Pleurisy   . Polysubstance dependence including opioid type drug, episodic abuse ([place]) 10/27/2013  . Protein calorie malnutrition ([place]) 10/31/2015  . Quadriparesis ([place])   . Quadriplegia, C5-C7 incomplete ([place]) 10/08/2013  . Stage IV pressure ulcer of sacral region ([place]) 10/31/2015    Past Surgical History:  Procedure Laterality Date  . APPENDECTOMY    . BACK SURGERY  10/08/2013   fusion of spine, cervical region  . CARDIAC SURGERY    . CYSTOSCOPY W/ URETERAL STENT PLACEMENT Right 03/09/2017   Procedure: CYSTOSCOPY [name] STENT PLACEMENT;  Surgeon: Irine Seal, MD;  [facility];  Service: Urology;  Laterality: Right;  . CYSTOSCOPY W/  URETERAL STENT REMOVAL Right 07/16/2017   Procedure: CYSTOSCOPY WITH STENT REMOVAL;  Surgeon: Franchot Gallo, MD;  [facility];  Service: Urology;  Laterality: Right;  . CYSTOSCOPY/URETEROSCOPY/HOLMIUM LASER/STENT PLACEMENT Right 07/16/2017   Procedure: CYSTOSCOPY/URETEROSCOPY/HOLMIUM LASER/STENT PLACEMENT/ALSO STONE EXTRACTION;  Surgeon: Franchot Gallo, MD;  [facility];  Service: Urology;  Laterality: Right;  . GASTROSTOMY  11/23/2015  . I&D EXTREMITY  10/28/2011   Procedure: IRRIGATION AND DEBRIDEMENT EXTREMITY;  Surgeon: Tennis Must, MD;  Location: [place];  Service: Orthopedics;  Laterality: Right;  Irrigation and debridement right middle finger  . INSERTION OF SUPRAPUBIC CATHETER N/A 07/28/2016   Procedure: INSERTION OF SUPRAPUBIC CATHETER;  Surgeon: Franchot Gallo, MD;  [facility];  Service: Urology;  Laterality: N/A;  . IR GENERIC HISTORICAL  11/23/2015   IR GASTROSTOMY TUBE MOD SED 11/23/2015 [facility]  . TRACHEOSTOMY      Family History  Problem Relation Age of Onset  . Hypertension Maternal Uncle   . Kidney failure Maternal Uncle   . Colon cancer Mother   . Lung cancer Father   . Hypertension Brother   . Kidney failure Maternal Aunt   . Kidney failure Maternal Uncle   . Colon cancer Maternal Grandfather     Social history:  reports that she has been smoking cigarettes. She has a 25.00 pack-year smoking history. She has never used smokeless tobacco. She reports that she does not drink alcohol or use drugs.  Allergies  Allergen Reactions  . Erythromycin Nausea And Vomiting  . Nitrofurantoin Monohyd Macro Nausea And Vomiting  . Oxybutynin     Dries mouth out   . Penicillins Hives    Has patient had a PCN reaction causing immediate rash, facial/tongue/throat swelling, SOB or lightheadedness with hypotension: Yes Has patient had a PCN reaction causing severe rash involving mucus membranes or skin necrosis: Yes Has patient had  a PCN reaction that required hospitalization No Has patient had a PCN reaction occurring within the last 10 years: No-childhood allergy If all of the above answers are "NO", then may proceed with Cephalosporin     Medications:  Prior to Admission medications   Medication Sig Start Date End Date Taking? Authorizing Provider  acetaminophen (TYLENOL) 500 MG tablet Take 1,000 mg by mouth every 6 (six) hours as needed for mild pain, moderate pain, fever or headache.    Yes [provider]  albuterol (PROVENTIL HFA;VENTOLIN HFA) 108 (90 Base) MCG/ACT inhaler INHALE 1-2 PUFFS INTO THE LUNGS EVERY 6 (SIX) HOURS AS NEEDED FOR WHEEZING OR SHORTNESS OF BREATH. Patient taking differently: Inhale 2 puffs into the lungs every 6 (six) hours as needed for wheezing or shortness of breath.  05/25/17  Yes Briscoe Deutscher, DO  albuterol (PROVENTIL) (2.5 MG/3ML) 0.083% nebulizer solution Take 2.5 mg by nebulization every 6 (six) hours as needed for wheezing or shortness of breath.   Yes [provider]  alum & mag hydroxide-simeth (MAALOX PLUS) 400-400-40 MG/5ML suspension Take 10-20 mLs by mouth 4 (four) times daily as needed for indigestion.   Yes [provider]  baclofen (LIORESAL) 20 MG tablet TAKE 1 TABLET BY MOUTH 3 TIMES A DAY 06/30/17  Yes Briscoe Deutscher, DO  Carboxymethylcellul-Glycerin (LUBRICATING EYE DROPS OP) Place 2 drops into both eyes every 4 (four) hours as needed (dry eyes).   Yes [provider]  [facility] MELATONIN 5 MG TABS TAKE 1 TABLET BY MOUTH AT BEDTIME 05/18/17  Yes Briscoe Deutscher, DO  DULoxetine (CYMBALTA) 60 MG capsule Take 1 capsule (60 mg total) by mouth 2 (two) times daily. 09/29/17  Yes Kathrynn Ducking, MD  fenofibrate (TRICOR) 145 MG tablet Take 145 mg by mouth daily. 09/25/17  Yes [provider]  furosemide (LASIX) 40 MG tablet Take 1 tablet (40 mg total) by mouth daily. 04/28/17  Yes Briscoe Deutscher, DO  gabapentin (NEURONTIN) 600 MG tablet TAKE 1  TABLET (600 MG TOTAL) BY MOUTH 4 (FOUR) TIMES DAILY. 09/22/17  Yes Kathrynn Ducking, MD  ibuprofen (ADVIL,MOTRIN) 200 MG tablet Place 2 tablets (400 mg total) into feeding tube every 6 (six) hours as needed for moderate pain. 11/26/15  Yes Florencia Reasons, MD  linaclotide [facility]) 72 MCG capsule Take 1 capsule (72 mcg total) by mouth daily before breakfast. 07/14/17  Yes Jackquline Denmark, MD  magnesium citrate SOLN Take 1 Bottle by mouth daily as needed for severe constipation.    Yes [provider]  magnesium hydroxide (MILK OF MAGNESIA) 400 MG/5ML suspension Take 5 mLs by mouth daily as needed for mild constipation. 11/10/16  Yes Briscoe Deutscher, DO  midodrine (PROAMATINE) 10 MG tablet PLACE 1 TABLET (10 MG TOTAL) INTO FEEDING TUBE 3 (THREE) TIMES DAILY WITH MEALS. 09/03/17  Yes Briscoe Deutscher, DO  Nutritional Supplements (FEEDING SUPPLEMENT, OSMOLITE 1.2 CAL,) LIQD Place 1,000 mLs into feeding tube continuous. Patient taking differently: Place 1,000 mLs into feeding tube continuous. 100 mLs Per Hour Over a 10 Hour Period=1,000 mLs. Feeding Given In The  Evening From 2300 to 0900. 01/28/17  Yes Briscoe Deutscher, DO  Nutritional Supplements (PROMOD) LIQD Give 30 mLs by tube 2 (two) times daily.    Yes [provider]  nystatin (MYCOSTATIN) 100000 UNIT/ML suspension Take 5 mLs (500,000 Units total) by mouth 4 (four) times daily. Patient taking differently: Take 500,000 Units by mouth 4 (four) times daily as needed (tongue swelling).  12/09/16  Yes Nat Christen, MD  omeprazole (PRILOSEC) 20 MG capsule Take 1 capsule (20 mg total) by mouth daily. 07/14/17  Yes Jackquline Denmark, MD  ondansetron (ZOFRAN) 4 MG tablet TAKE 1 TABLET BY MOUTH EVERY 8 HOURS AS NEEDED FOR NAUSEA AND VOMITING 08/12/17  Yes Briscoe Deutscher, DO  Oxycodone HCl 10 MG TABS Take 10 mg by mouth 4 (four) times daily as needed (for pain).    Yes [provider]  OXYGEN Inhale 3 L into the lungs continuous.   Yes [provider]   oxymetazoline (AFRIN) 0.05 % nasal spray Place 2 sprays into both nostrils 3 (three) times daily as needed for congestion.   Yes [provider]  polyethylene glycol powder (GLYCOLAX/MIRALAX) powder Take 17 g by mouth daily. 07/14/17  Yes Jackquline Denmark, MD  potassium chloride SA (KLOR-CON M20) 20 MEQ tablet Take 1 tablet (20 mEq total) by mouth daily. 07/20/17  Yes Briscoe Deutscher, DO  Skin Protectants, Misc. (BALMEX SKIN PROTECTANT EX) Apply 1 application topically 3 (three) times daily as needed (redness/irritation).    Yes [provider]  tiotropium (SPIRIVA HANDIHALER) 18 MCG inhalation capsule Place 1 capsule (18 mcg total) into inhaler and inhale daily. 04/03/17  Yes Erick Colace, NP  tiZANidine (ZANAFLEX) 2 MG tablet TAKE 1 TABLET (2 MG TOTAL) BY MOUTH 3 (THREE) TIMES DAILY. 09/22/17  Yes Kathrynn Ducking, MD  Water For Irrigation, Sterile (FREE WATER) SOLN Place 100 mLs into feeding tube every 8 (eight) hours. 11/26/15  Yes Florencia Reasons, MD  zolpidem (AMBIEN) 10 MG tablet Take 10 mg by mouth at bedtime.   Yes [provider]    ROS:  Out of a complete 14 system review of symptoms, the patient complains only of the following symptoms, and all other reviewed systems are negative.  Chronic pain Weakness  Pulse 80, last menstrual period 05/27/2016, SpO2 97 %.  Physical Exam  General: The patient is alert and cooperative at the time of the examination.  Skin: No significant peripheral edema is noted.   Neurologic Exam  Mental status: The patient is alert and oriented x 3 at the time of the examination. The patient has apparent normal recent and remote memory, with an apparently normal attention span and concentration ability.   Cranial nerves: Facial symmetry is present. Speech is normal, no aphasia or dysarthria is noted. Extraocular movements are full. Visual fields are full.  Motor: The patient has the ability to flex at the elbows bilaterally and has  fair strength of the deltoid muscles bilaterally.  She cannot extend the arms, she has no voluntary control with finger flexion or extension.  The fingers remain in flexion.  They can be passively straightened out with some difficulty.  Sensory examination: Soft touch sensation is symmetric on the face, the patient has some sensation of the arms bilaterally that is symmetric, no sensation on the legs.  Coordination: The patient is unable to perform finger-nose-finger or heel shin on either side.  Gait and station: The patient is unable to ambulate.  Reflexes: Deep tendon reflexes are symmetric.  Assessment/Plan:  1.  Spastic quadriparesis, spinal cord injury at C5-6 level  2.  Chronic pain syndrome  The patient will be increased on Cymbalta taking 60 mg twice daily.  The patient has had improvement in her hallucinations with the lower dose of her tizanidine.  The patient will continue her current dose of gabapentin.  She will follow-up in 6 months.  We will try to get occupational therapy out to the house to see if the patient may regain some independence with ability to feed herself.  She uses [facility] for nursing.  Jill Alexanders MD 09/29/2017 12:09 PM  [facility] [address] [place], [place] [address]  Phone [phone] Fax [phone]

## 2017-09-29 NOTE — Patient Instructions (Signed)
We will try to get Occupational therapy out to the house.  We will increase the Cymbalta to 60 mg twice a day.  Cymbalta (duloxetine) is an antidepressant medication that is commonly used for peripheral neuropathy pain or for fibromyalgia pain. As with any antidepressant medication, worsening depression can be seen. This medication can potentially cause headache, dizziness, sexual dysfunction, or nausea. If any problems are noted on this medication, please contact our office.

## 2017-10-01 ENCOUNTER — Other Ambulatory Visit: Payer: Self-pay | Admitting: Family Medicine

## 2017-10-02 ENCOUNTER — Telehealth: Payer: Self-pay | Admitting: *Deleted

## 2017-10-02 NOTE — Telephone Encounter (Signed)
Faxed back signed order to [facility] re: care plan. Fax: [phone]. Received fax confirmation.

## 2017-10-18 ENCOUNTER — Other Ambulatory Visit: Payer: Self-pay | Admitting: Family Medicine

## 2017-10-26 ENCOUNTER — Encounter (HOSPITAL_BASED_OUTPATIENT_CLINIC_OR_DEPARTMENT_OTHER): Payer: Medicare Other | Attending: Internal Medicine

## 2017-10-26 DIAGNOSIS — L89322 Pressure ulcer of left buttock, stage 2: Secondary | ICD-10-CM | POA: Insufficient documentation

## 2017-10-26 DIAGNOSIS — L89154 Pressure ulcer of sacral region, stage 4: Secondary | ICD-10-CM | POA: Diagnosis not present

## 2017-10-26 DIAGNOSIS — L89312 Pressure ulcer of right buttock, stage 2: Secondary | ICD-10-CM | POA: Diagnosis not present

## 2017-10-26 DIAGNOSIS — G8253 Quadriplegia, C5-C7 complete: Secondary | ICD-10-CM | POA: Diagnosis not present

## 2017-10-26 IMAGING — DX DG CHEST 1V PORT
1 series · 1 of 1 positions shown · non-contrast
Comparison: Portable exam 8976 hours compared to 05/07/2015

CLINICAL DATA: Lethargy, no bowel movement for 3 days, abdominal
distension, low urine output, shallow respirations, asthma, former
smoker

EXAM:
PORTABLE CHEST 1 VIEW

[chest ap]
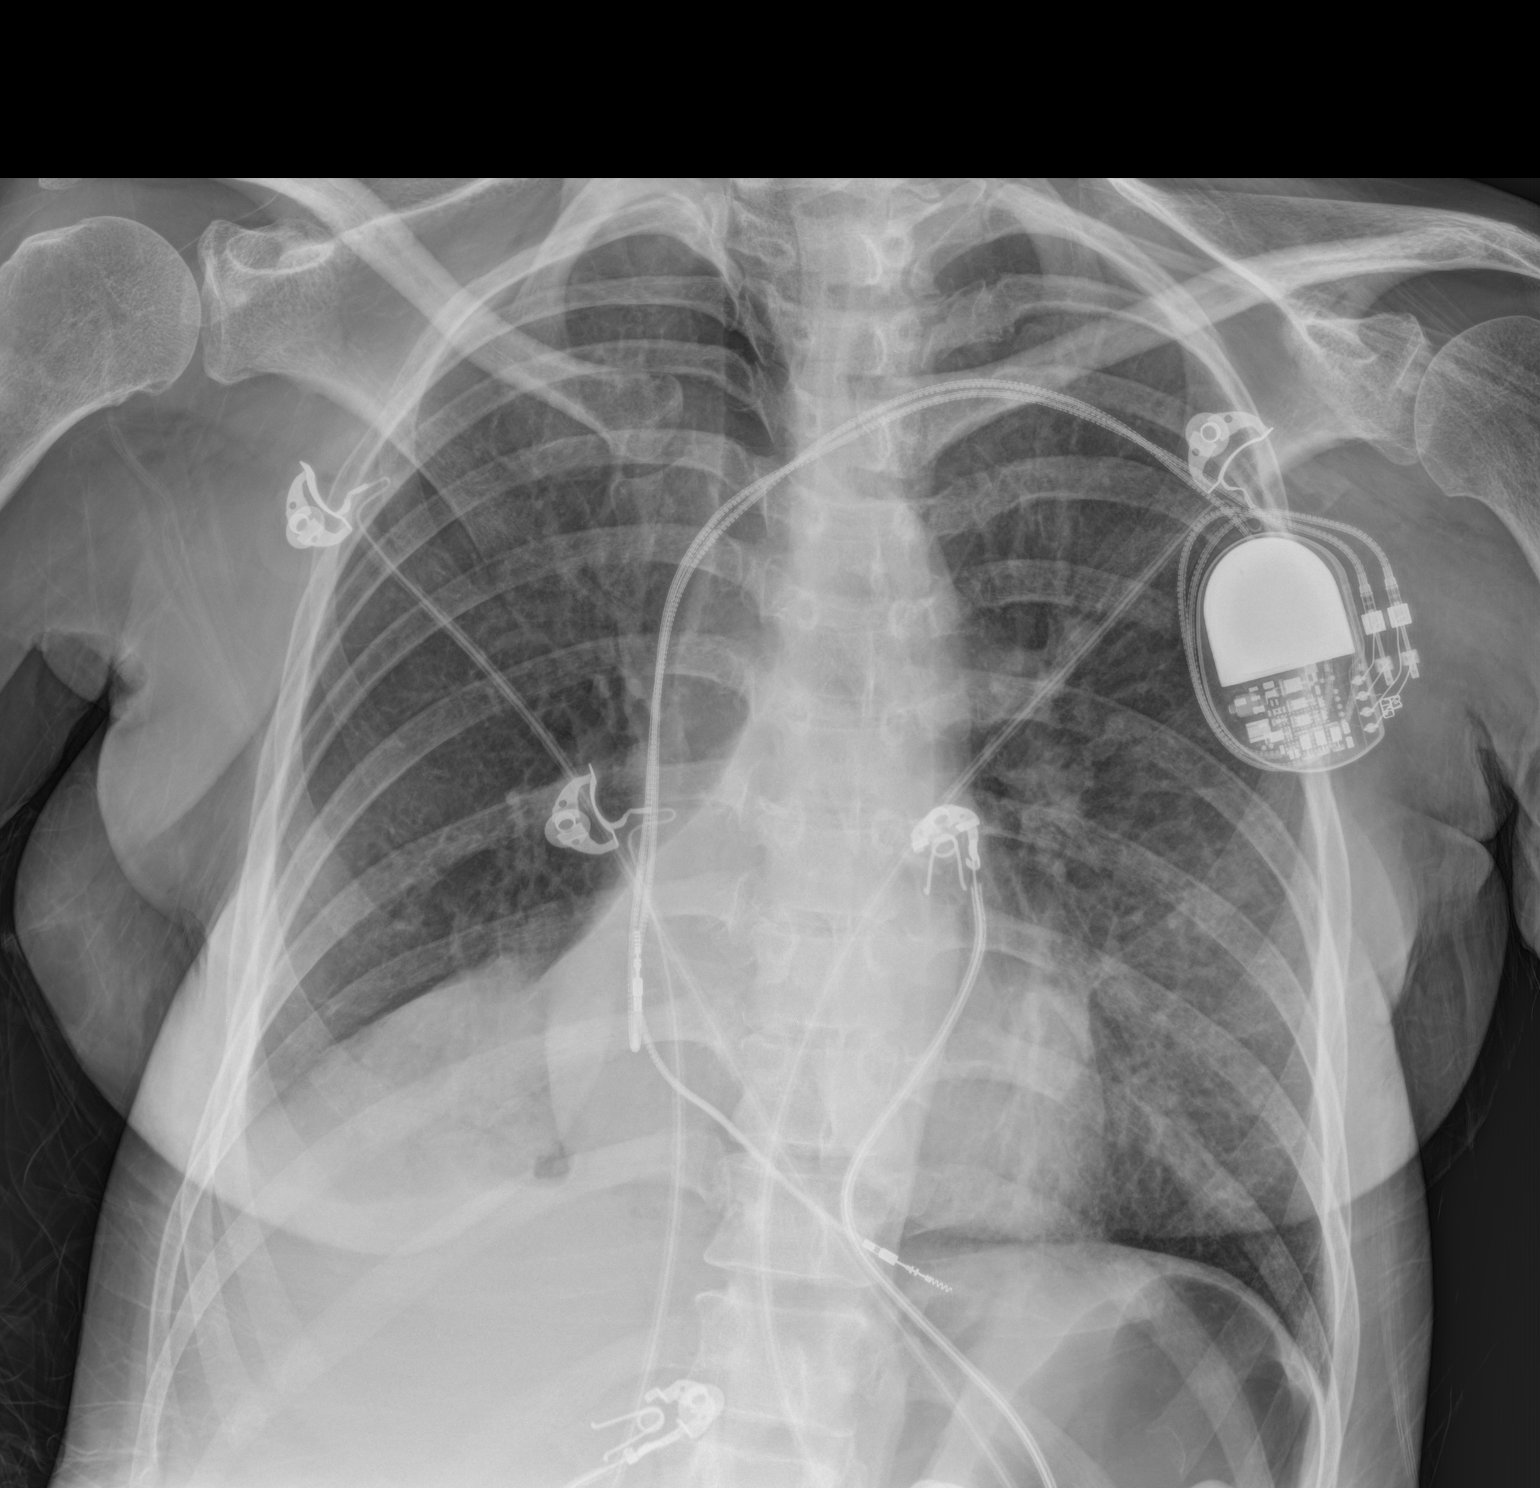

[1 of 1 positions shown; findings below may reference images not displayed]

FINDINGS: LEFT subclavian transvenous pacemaker leads project over RIGHT
atrium and RIGHT ventricle, unchanged.

Normal heart size, mediastinal contours and pulmonary vascularity.

RIGHT lower lobe atelectasis.

Mild subsegmental atelectasis versus infiltrate at medial LEFT lower
lobe.

Upper lungs clear.

No pleural effusion or pneumothorax.
IMPRESSION: Atelectasis of RIGHT lower lobe with mild infiltrate versus
atelectasis in medial LEFT lower lobe.

## 2017-10-27 ENCOUNTER — Other Ambulatory Visit: Payer: Self-pay | Admitting: Family Medicine

## 2017-11-07 ENCOUNTER — Other Ambulatory Visit: Payer: Self-pay | Admitting: Family Medicine

## 2017-11-07 DIAGNOSIS — G4700 Insomnia, unspecified: Secondary | ICD-10-CM

## 2017-11-15 ENCOUNTER — Other Ambulatory Visit: Payer: Self-pay | Admitting: Neurology

## 2017-11-18 ENCOUNTER — Encounter: Payer: Self-pay | Admitting: Gastroenterology

## 2017-11-18 ENCOUNTER — Ambulatory Visit (INDEPENDENT_AMBULATORY_CARE_PROVIDER_SITE_OTHER): Payer: Medicare Other | Admitting: Gastroenterology

## 2017-11-18 VITALS — BP 88/54 | HR 77 | Ht 62.0 in | Wt 130.0 lb

## 2017-11-18 DIAGNOSIS — K5900 Constipation, unspecified: Secondary | ICD-10-CM

## 2017-11-18 DIAGNOSIS — R140 Abdominal distension (gaseous): Secondary | ICD-10-CM

## 2017-11-18 MED ORDER — PRUCALOPRIDE SUCCINATE 2 MG PO TABS: 2.0000 mg | ORAL_TABLET | Freq: Every day | ORAL | 3 refills | Status: DC

## 2017-11-18 MED ORDER — PRUCALOPRIDE SUCCINATE 2 MG PO TABS: 2.0000 mg | ORAL_TABLET | Freq: Every day | ORAL | 0 refills | Status: DC

## 2017-11-18 NOTE — Patient Instructions (Signed)
If you are age 47 or older, your body mass index should be between 23-30. Your Body mass index is 23.78 kg/m. If this is out of the aforementioned range listed, please consider follow up with your Primary Care Provider.  If you are age 70 or younger, your body mass index should be between 19-25. Your Body mass index is 23.78 kg/m. If this is out of the aformentioned range listed, please consider follow up with your Primary Care Provider.   We have sent the following medications to your pharmacy for you to pick up at your convenience: Motegrity 2 mg once daily.   Thank you,  Dr. Jackquline Denmark

## 2017-11-18 NOTE — Progress Notes (Signed)
Chief Complaint:   Referring Provider:  Briscoe Deutscher, DO      ASSESSMENT AND PLAN;   #1.  Feeding difficulties/PEG feeds with weight gain. -Continue current feeds for now at Osmolyte 167ml/hr x 10 hours as already been done.  -Trial of Montegrity 2mg  po PO/PEG po qd. #2. Epigastric pain - resolved (has been on nonsteroidals) -Continue omeprazole 20mg  po qd.  #3. Chronic constipation (on oxycodone, failed Amitiza,  Movantik) with abdominal bloating. -Increase Miralax to 17g po bid. That has helped. -Continue linzess 72 mcg PO/PEG qd. -Minimize pain medications. -Montegrity 2mg  po qd. -Follow-up in 3 months.  Earlier, if she still has problems.  #4.  PEG tube details -inserted by IR 11/23/2015, 20 Fr pull-through PEG.   HPI:    Angela Page is a 47 y.o. female  With paraplegia due to C5/C6 spinal cord injury following a fall in 2015.  Has a tracheostomy and a PEG tube.  She is getting PEG feeding overnight Osmolite 100 cc/h for 10 hours and has been taking p.o. with occasional problems with dysphagia.  She did have history of aspiration in the past.  Complains of postprandial abdominal bloating especially if she eats burgers.  Then, she cannot tolerate PEG tube feeds for at least 8 to 12 hours.  Also complains of some upper abdominal discomfort/pain.  Occasional regurgitation and heartburn.  Denies having any melena or hematochezia.  Has been given ibuprofen every day.  Does not want EGD.  Has abdominal bloating with significant constipation on narcotics.  She is getting MiraLAX 17 g once a day and milk of magnesia on a as needed basis with some relief.  Cannot hold enemas per nursing staff.  Lives at home and is full care.  Past Medical History:  Diagnosis Date  . Anasarca 06/10/2016  . Asthma   . Cocaine use 10/08/2013  . Depression   . GERD (gastroesophageal reflux disease)   . HCAP (healthcare-associated pneumonia) 06/09/2016  . Hepatitis    hx of hepatits  frm mono   . Kidney stone   . Overdose of opiate or related narcotic ([place]) 09/02/2014  . Pacemaker   . Pleurisy   . Polysubstance dependence including opioid type drug, episodic abuse ([place]) 10/27/2013  . Protein calorie malnutrition ([place]) 10/31/2015  . Quadriparesis ([place])   . Quadriplegia, C5-C7 incomplete ([place]) 10/08/2013  . Stage IV pressure ulcer of sacral region ([place]) 10/31/2015    Past Surgical History:  Procedure Laterality Date  . APPENDECTOMY    . BACK SURGERY  10/08/2013   fusion of spine, cervical region  . CARDIAC SURGERY    . CYSTOSCOPY W/ URETERAL STENT PLACEMENT Right 03/09/2017   Procedure: CYSTOSCOPY [name] STENT PLACEMENT;  Surgeon: Irine Seal, MD;  [facility];  Service: Urology;  Laterality: Right;  . CYSTOSCOPY W/ URETERAL STENT REMOVAL Right 07/16/2017   Procedure: CYSTOSCOPY WITH STENT REMOVAL;  Surgeon: Franchot Gallo, MD;  [facility];  Service: Urology;  Laterality: Right;  . CYSTOSCOPY/URETEROSCOPY/HOLMIUM LASER/STENT PLACEMENT Right 07/16/2017   Procedure: CYSTOSCOPY/URETEROSCOPY/HOLMIUM LASER/STENT PLACEMENT/ALSO STONE EXTRACTION;  Surgeon: Franchot Gallo, MD;  [facility];  Service: Urology;  Laterality: Right;  . GASTROSTOMY  11/23/2015  . I&D EXTREMITY  10/28/2011   Procedure: IRRIGATION AND DEBRIDEMENT EXTREMITY;  Surgeon: Tennis Must, MD;  Location: [place];  Service: Orthopedics;  Laterality: Right;  Irrigation and debridement right middle finger  . INSERTION OF SUPRAPUBIC CATHETER N/A 07/28/2016   Procedure: INSERTION OF SUPRAPUBIC CATHETER;  Surgeon: Franchot Gallo, MD;  [facility];  Service: Urology;  Laterality: N/A;  . IR GENERIC HISTORICAL  11/23/2015   IR GASTROSTOMY TUBE MOD SED 11/23/2015 [facility]  . LITHOTRIPSY  08/2017  . TRACHEOSTOMY      Family History  Problem Relation Age of Onset  . Hypertension Maternal Uncle   . Kidney failure Maternal Uncle   . Colon cancer Mother   . Lung  cancer Father   . Hypertension Brother   . Kidney failure Maternal Aunt   . Kidney failure Maternal Uncle   . Colon cancer Maternal Grandfather   . Esophageal cancer Neg Hx     Social History   Tobacco Use  . Smoking status: Current Every Day Smoker    Packs/day: 1.00    Years: 25.00    Pack years: 25.00    Types: Cigarettes  . Smokeless tobacco: Never Used  Substance Use Topics  . Alcohol use: Never    Frequency: Never  . Drug use: No    Current Outpatient Medications  Medication Sig Dispense Refill  . acetaminophen (TYLENOL) 500 MG tablet Take 1,000 mg by mouth every 6 (six) hours as needed for mild pain, moderate pain, fever or headache.     . albuterol (PROVENTIL HFA;VENTOLIN HFA) 108 (90 Base) MCG/ACT inhaler INHALE 1-2 PUFFS INTO THE LUNGS EVERY 6 (SIX) HOURS AS NEEDED FOR WHEEZING OR SHORTNESS OF BREATH. 8.5 Inhaler 1  . alum & mag hydroxide-simeth (MAALOX PLUS) 400-400-40 MG/5ML suspension Take 10-20 mLs by mouth 4 (four) times daily as needed for indigestion.    . baclofen (LIORESAL) 20 MG tablet TAKE 1 TABLET BY MOUTH 3 TIMES A DAY 90 tablet 11  . carboxymethylcellul-glycerin (REFRESH OPTIVE) 0.5-0.9 % ophthalmic solution 1 drop.    . [facility]S MELATONIN 5 MG TABS TAKE 1 TABLET BY MOUTH EVERYDAY AT BEDTIME 30 tablet 5  . DULoxetine (CYMBALTA) 60 MG capsule Take 1 capsule (60 mg total) by mouth 2 (two) times daily. 180 capsule 1  . fenofibrate (TRICOR) 145 MG tablet Take 145 mg by mouth daily.  2  . furosemide (LASIX) 40 MG tablet Take 1 tablet (40 mg total) by mouth daily. 30 tablet 5  . gabapentin (NEURONTIN) 600 MG tablet TAKE 1 TABLET (600 MG TOTAL) BY MOUTH 4 (FOUR) TIMES DAILY. 360 tablet 0  . ibuprofen (ADVIL,MOTRIN) 200 MG tablet Place 2 tablets (400 mg total) into feeding tube every 6 (six) hours as needed for moderate pain. 30 tablet 0  . KLOR-CON M20 20 MEQ tablet TAKE 1 TABLET BY MOUTH EVERY DAY 90 tablet 1  . linaclotide (LINZESS) 72 MCG capsule Take 1 capsule (72  mcg total) by mouth daily before breakfast. 30 capsule 11  . magnesium citrate SOLN Take 1 Bottle by mouth daily as needed for severe constipation.     . magnesium hydroxide (MILK OF MAGNESIA) 400 MG/5ML suspension Take 5 mLs by mouth daily as needed for mild constipation. 360 mL 0  . midodrine (PROAMATINE) 10 MG tablet PLACE 1 TABLET (10 MG TOTAL) INTO FEEDING TUBE 3 (THREE) TIMES DAILY WITH MEALS. 90 tablet 1  . Nutritional Supplements (FEEDING SUPPLEMENT, OSMOLITE 1.2 CAL,) LIQD Place 1,000 mLs into feeding tube continuous. (Patient taking differently: Place 1,000 mLs into feeding tube continuous. 100 mLs Per Hour Over a 10 Hour Period=1,000 mLs. Feeding Given In The Evening From 2300 to 0900.) 1000 mL 12  . Nutritional Supplements (PROMOD) LIQD Give 30 mLs by tube 2 (two) times daily.     Marland Kitchen  nystatin (MYCOSTATIN) 100000 UNIT/ML suspension Take 5 mLs (500,000 Units total) by mouth 4 (four) times daily. (Patient taking differently: Take 500,000 Units by mouth 4 (four) times daily as needed (tongue swelling). ) 60 mL 1  . omeprazole (PRILOSEC) 20 MG capsule Take 1 capsule (20 mg total) by mouth daily. 30 capsule 6  . ondansetron (ZOFRAN) 4 MG tablet TAKE 1 TABLET BY MOUTH EVERY 8 HOURS AS NEEDED FOR NAUSEA AND VOMITING 20 tablet 1  . Oxycodone HCl 10 MG TABS Take 10 mg by mouth 4 (four) times daily as needed (for pain).     . OXYGEN Inhale 3 L into the lungs continuous.    Marland Kitchen oxymetazoline (AFRIN) 0.05 % nasal spray Place 2 sprays into both nostrils 3 (three) times daily as needed for congestion.    . polyethylene glycol powder (GLYCOLAX/MIRALAX) powder Take 17 g by mouth daily. (Patient taking differently: Take 17 g by mouth as needed. ) 255 g 3  . Skin Protectants, Misc. (BALMEX SKIN PROTECTANT EX) Apply 1 application topically 3 (three) times daily as needed (redness/irritation).     Marland Kitchen tiotropium (SPIRIVA HANDIHALER) 18 MCG inhalation capsule Place 1 capsule (18 mcg total) into inhaler and inhale  daily. 30 capsule 12  . tiZANidine (ZANAFLEX) 2 MG tablet TAKE 1 TABLET (2 MG TOTAL) BY MOUTH 3 (THREE) TIMES DAILY. 90 tablet 3  . Water For Irrigation, Sterile (FREE WATER) SOLN Place 100 mLs into feeding tube every 8 (eight) hours. 1000 mL 0  . zolpidem (AMBIEN) 10 MG tablet Take 10 mg by mouth at bedtime.    Marland Kitchen albuterol (PROVENTIL) (2.5 MG/3ML) 0.083% nebulizer solution Take 2.5 mg by nebulization every 6 (six) hours as needed for wheezing or shortness of breath.     No current facility-administered medications for this visit.     Allergies  Allergen Reactions  . Erythromycin Nausea And Vomiting  . Nitrofurantoin Monohyd Macro Nausea And Vomiting  . Oxybutynin     Dries mouth out   . Penicillins Hives    Has patient had a PCN reaction causing immediate rash, facial/tongue/throat swelling, SOB or lightheadedness with hypotension: Yes Has patient had a PCN reaction causing severe rash involving mucus membranes or skin necrosis: Yes Has patient had a PCN reaction that required hospitalization No Has patient had a PCN reaction occurring within the last 10 years: No-childhood allergy If all of the above answers are "NO", then may proceed with Cephalosporin     Review of Systems:  Constitutional: Denies fever, chills, Has fatigue.  Bedbound. HEENT: Has allergies. Respiratory: Denies SOB, chest tightness,  and wheezing.   Cardiovascular: Has heart murmur. Genitourinary: Has catheter. Musculoskeletal: Has arthritis and is bedbound.  Has muscle cramps and spasticity. Skin: No rash.  Neurological: Denies dizziness, seizures, syncope, weakness, light-headedness, numbness and headaches.  Hematological: Denies adenopathy. Easy bruising, personal or family bleeding history  Psychiatric/Behavioral: Has anxiety or depression     Physical Exam:    BP (!) 88/54   Pulse 77   Ht 5\' 2"  (1.575 m)   Wt 130 lb (59 kg)   LMP 05/27/2016 (Within Days) Comment: Irregular periods since October  2015.  BMI 23.78 kg/m  Filed Weights   11/18/17 1042  Weight: 130 lb (59 kg)   Constitutional:  Well-developed, in no acute distress. Psychiatric: Normal mood and affect. Behavior is normal. HEENT: Pupils normal.  Conjunctivae are normal. No scleral icterus. Neck has tracheostomy Cardiovascular: Normal rate, regular rhythm. No edema Pulmonary/chest: Effort normal and  breath sounds normal. No wheezing, rales or rhonchi. Abdominal: Soft, nondistended. Nontender. Bowel sounds active throughout. There are no masses palpable. No hepatomegaly.  Has a PEG tube. Rectal:  defered Neurological: Alert and oriented to person place and time. Paraplegic Skin: Skin is warm and dry. No rashes noted.  Data Reviewed: I have personally reviewed following labs and imaging studies   Carmell Austria, MD 11/18/2017, 10:56 AM  Cc: Briscoe Deutscher, DO

## 2017-11-23 ENCOUNTER — Encounter (HOSPITAL_BASED_OUTPATIENT_CLINIC_OR_DEPARTMENT_OTHER): Payer: Medicare Other | Attending: Internal Medicine

## 2017-11-23 DIAGNOSIS — K5900 Constipation, unspecified: Secondary | ICD-10-CM | POA: Diagnosis not present

## 2017-11-23 DIAGNOSIS — L89312 Pressure ulcer of right buttock, stage 2: Secondary | ICD-10-CM | POA: Diagnosis present

## 2017-11-23 DIAGNOSIS — L89322 Pressure ulcer of left buttock, stage 2: Secondary | ICD-10-CM | POA: Insufficient documentation

## 2017-11-23 DIAGNOSIS — G8250 Quadriplegia, unspecified: Secondary | ICD-10-CM | POA: Diagnosis not present

## 2017-11-29 ENCOUNTER — Other Ambulatory Visit: Payer: Self-pay | Admitting: Family Medicine

## 2017-12-01 ENCOUNTER — Telehealth: Payer: Self-pay | Admitting: Family Medicine

## 2017-12-01 ENCOUNTER — Telehealth: Payer: Self-pay | Admitting: Gastroenterology

## 2017-12-01 NOTE — Telephone Encounter (Signed)
Pt calling to follow up on PA for motegrity, pt states that she is running out of samples that she was given.

## 2017-12-01 NOTE — Telephone Encounter (Signed)
Copied from [place] [id number]. Topic: Quick Communication - Home Health Verbal Orders >> Dec 01, 2017  2:42 PM Percell Belt A wrote: Caller/Agency: Radene Journey with [facility] Number: [phone] Requesting OT/PT/Skilled Nursing/Social Work:  Aaron Edelman called in and stated that he is re faxing nursing orders over today.  Be on the look out for 6 pages of nursing orders   Fax number  [phone]

## 2017-12-02 ENCOUNTER — Ambulatory Visit (HOSPITAL_COMMUNITY): Payer: Medicare Other

## 2017-12-02 NOTE — Telephone Encounter (Signed)
Called patient, waiting for returned phone call.

## 2017-12-02 NOTE — Telephone Encounter (Signed)
Aaron Edelman with [facility] calling to make sure that fax was received.

## 2017-12-02 NOTE — Telephone Encounter (Signed)
See note

## 2017-12-03 ENCOUNTER — Ambulatory Visit (HOSPITAL_COMMUNITY): Payer: Medicare Other

## 2017-12-03 NOTE — Telephone Encounter (Signed)
Called patient mom needs o/v to have ppw filled out. They will call back to make app. I have l/m for Aaron Edelman to let him know. PPW in blue folder for app

## 2017-12-03 NOTE — Telephone Encounter (Signed)
Angela Page called back and said that the only problem he has with that is that he sent the initial request back on October 18th. They only have 28 days from that point of verbal order. Please advise.

## 2017-12-03 NOTE — Telephone Encounter (Signed)
See note

## 2017-12-04 IMAGING — DX DG CHEST 1V PORT
1 series · 1 of 1 positions shown · non-contrast
Comparison: 09/24/2015

CLINICAL DATA: Respiratory distress

EXAM:
PORTABLE CHEST 1 VIEW

[chest ap]
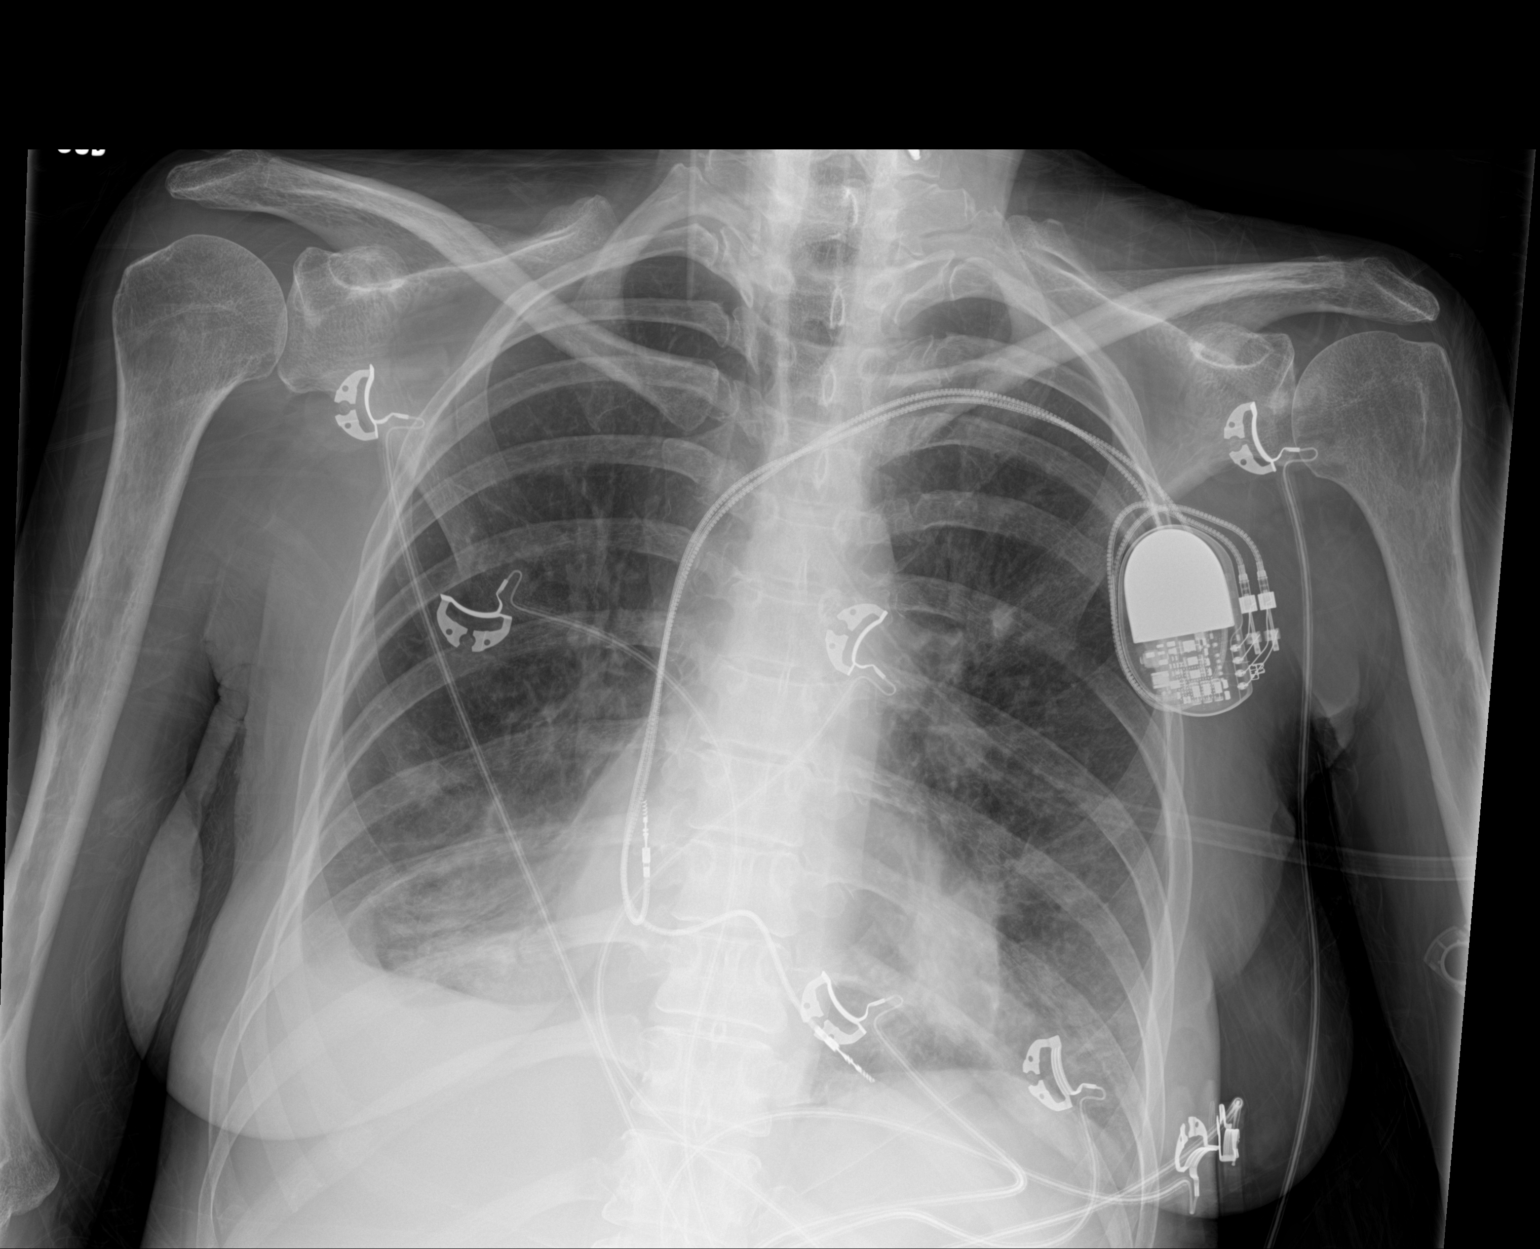

[1 of 1 positions shown; findings below may reference images not displayed]

FINDINGS: Cardiac shadow is stable. The lungs are well aerated bilaterally and
again demonstrate some bibasilar infiltrative changes. They have
improved somewhat in the interval from the prior exam. Small right
pleural effusion remains. No new focal abnormality is seen.
IMPRESSION: Persistent changes in the bases bilaterally right slightly greater
than left although some improvement is noted. A right effusion
remains.

## 2017-12-04 NOTE — Telephone Encounter (Signed)
We have not seen patient ok to fill out?

## 2017-12-04 NOTE — Telephone Encounter (Signed)
Insurance will not approve this medication, would you like to change it to something else?

## 2017-12-04 NOTE — Telephone Encounter (Signed)
Aaron Edelman calling.  States that they need a care plan faxed over by today. Cert period 64/84 - 72/07 and it should be 5-6 pages long. States that he needs this filled out, signed, and faxed over today. Aaron Edelman can be reached at [phone]

## 2017-12-04 NOTE — Telephone Encounter (Signed)
Okay but I know that I have already filled out the form in the past few weeks. She still needs appointment.

## 2017-12-05 NOTE — Telephone Encounter (Signed)
Did it work? If it did, lets get some samples and see if we can appeal

## 2017-12-06 IMAGING — DX DG CHEST 1V PORT
1 series · 1 of 1 positions shown · non-contrast
Comparison: Chest radiograph performed earlier today at [DATE] a.m.

CLINICAL DATA: Endotracheal tube repositioning.  Initial encounter.

EXAM:
PORTABLE CHEST 1 VIEW

[chest ap]
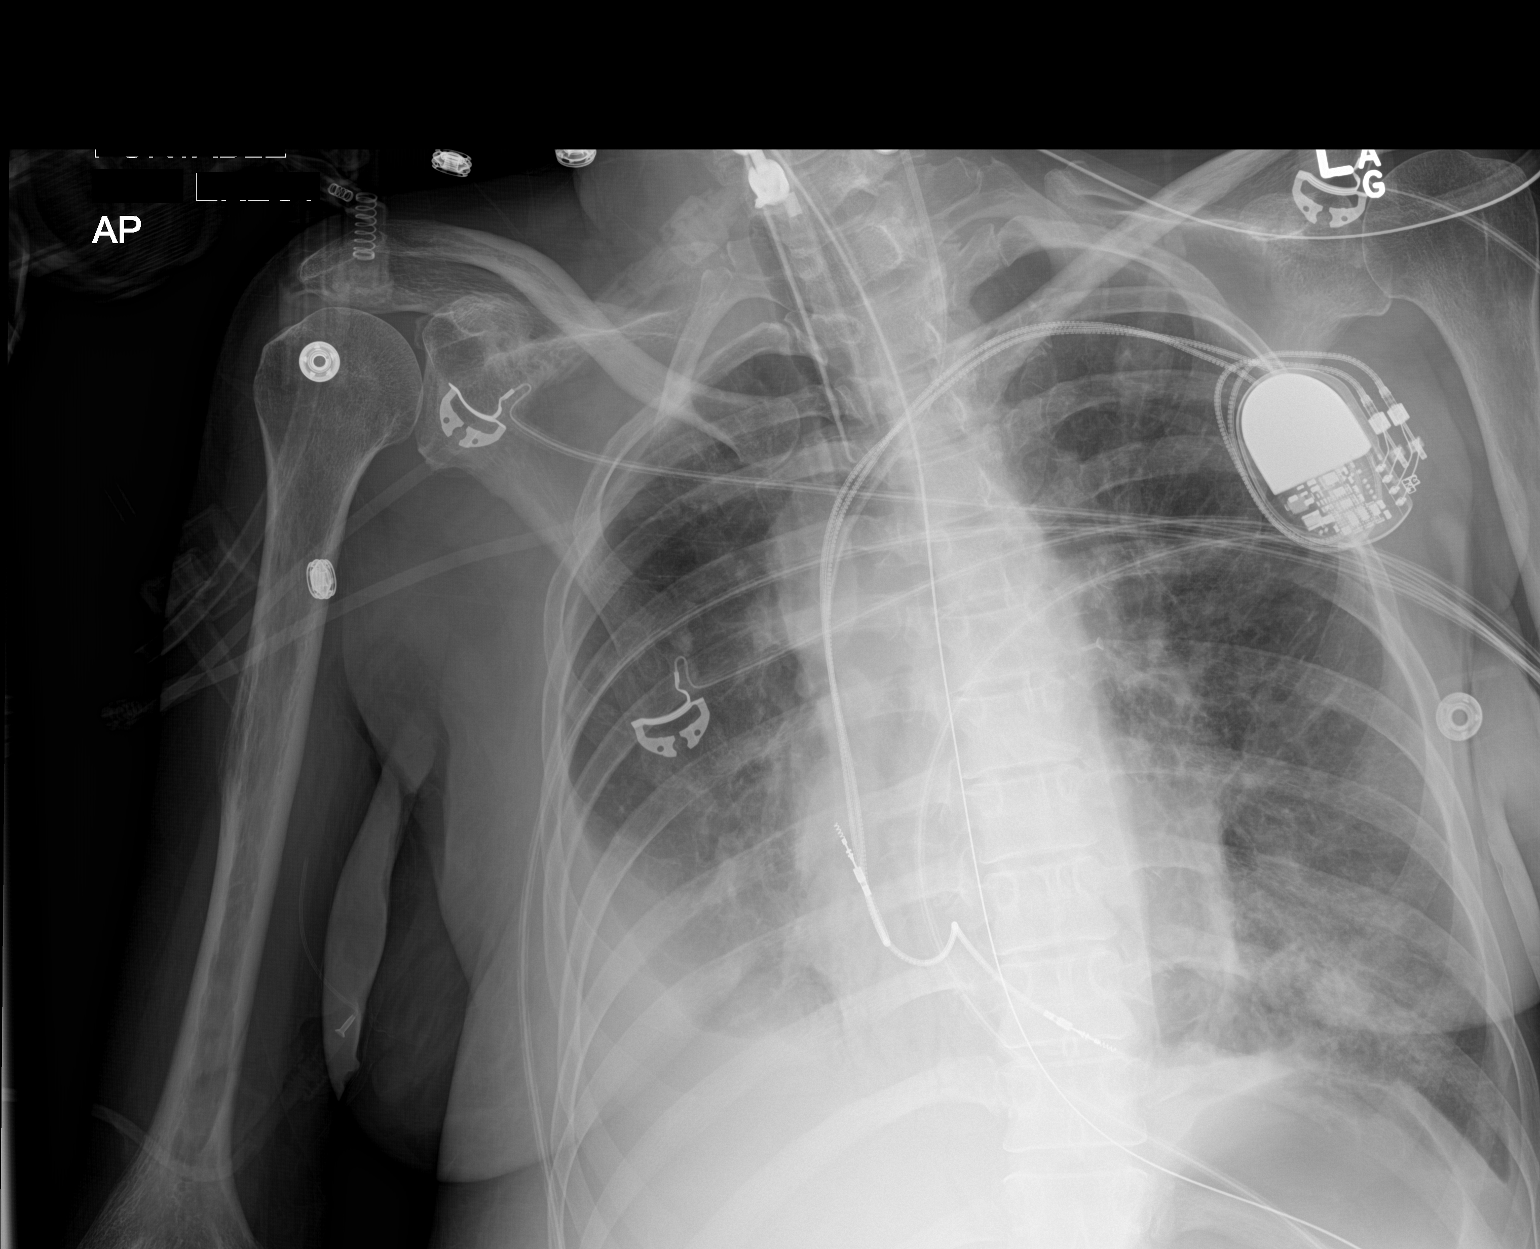

[1 of 1 positions shown; findings below may reference images not displayed]

FINDINGS: The patient's endotracheal tube is seen ending 4 cm above the
carina.

An enteric tube is noted extending below the diaphragm.

A small right pleural effusion is noted. Vascular congestion is
noted, with increased interstitial markings, concerning for mild
pulmonary edema. No pneumothorax is seen.

The cardiomediastinal silhouette is normal in size. A pacemaker is
noted overlying the left chest wall, with leads ending overlying the
right atrium and right ventricle. No acute osseous abnormalities are
identified. Cervical spinal fusion hardware is partially imaged.
IMPRESSION: 1. Endotracheal tube noted ending 4 cm above the carina.
2. Small right pleural effusion. Vascular congestion, with increased
interstitial markings, concerning for mild pulmonary edema.

## 2017-12-07 IMAGING — DX DG CHEST 1V PORT
1 series · 1 of 1 positions shown · non-contrast
Comparison: Multiple recent previous exams.

CLINICAL DATA: Healthcare associated pneumonia.

EXAM:
PORTABLE CHEST 1 VIEW

[chest ap]
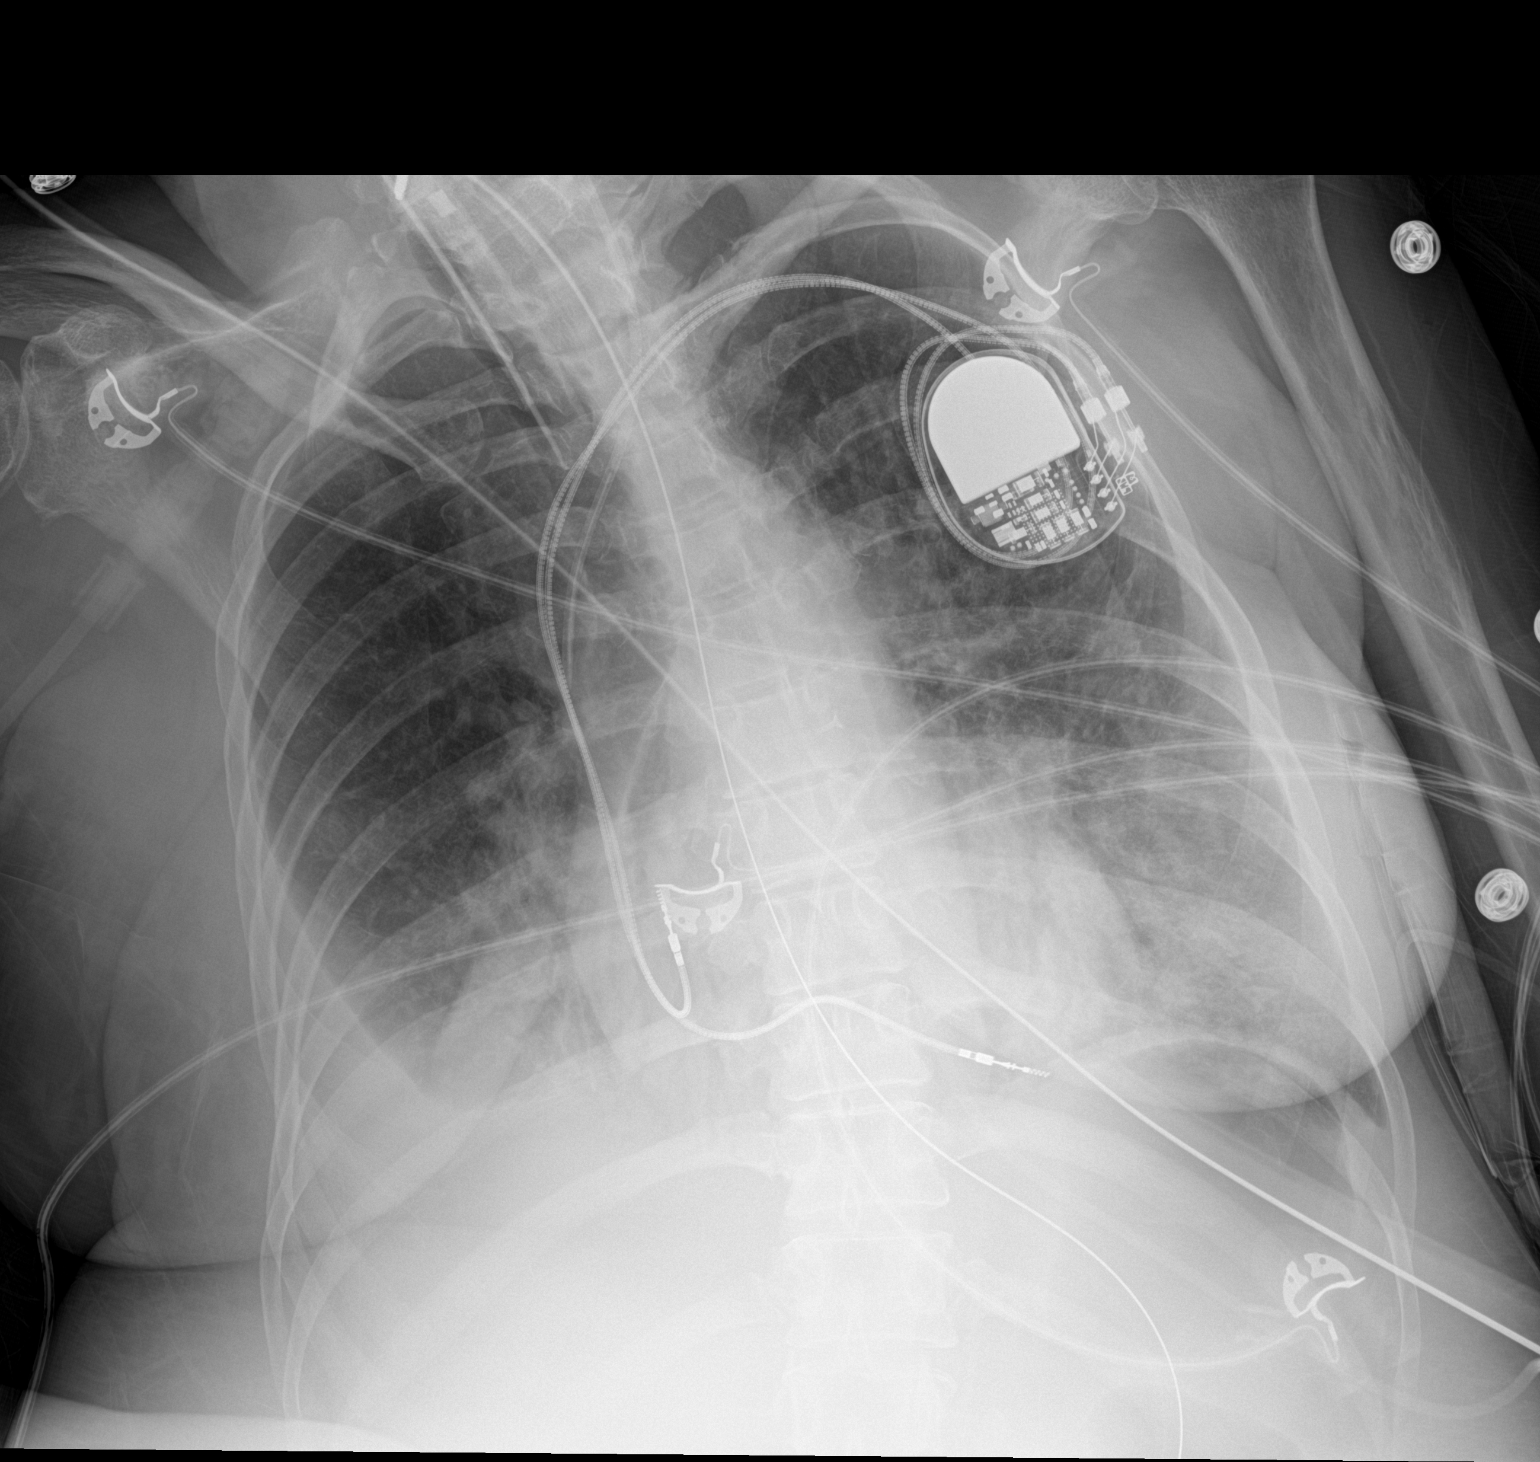

[1 of 1 positions shown; findings below may reference images not displayed]

FINDINGS: 9097 hours. Endotracheal tube tip is 3.4 cm above the base of the
carina. The NG tube passes into the stomach although the distal tip
position is not included on the film. Left IJ central line tip
overlies the upper SVC. Left permanent pacemaker again noted.
Patient is rotated to the right. Bibasilar atelectasis/infiltrate
again noted, slightly progressed on the left. Small right pleural
effusion stable. Prominent gastric bubble noted despite the presence
of an NG tube.
IMPRESSION: 1. Stable cardiopulmonary exam.
2. Prominent gastric bubble despite the presence of an NG tube.

## 2017-12-07 NOTE — Telephone Encounter (Signed)
Aaron Edelman with [facility] calling and states that the plan of care sent over on Friday was the old request. Spoke with Madison at the office and she states this was the only one on file for pt. Levada Schilling to refax the new request. He is faxing over now and request a call back to let him know this was received. He also states he needs this back urgently as it has passed the 28 day turn around time. Please advise. CB#: [phone].

## 2017-12-08 NOTE — Telephone Encounter (Signed)
Lets try lactulose 20 g p.o. twice daily

## 2017-12-08 NOTE — Telephone Encounter (Signed)
Insurance wants patient to try Lactulose first.

## 2017-12-09 ENCOUNTER — Ambulatory Visit (HOSPITAL_COMMUNITY)
Admission: RE | Admit: 2017-12-09 | Discharge: 2017-12-09 | Disposition: A | Discharge status: Home / Self Care | Payer: Medicare Other | Source: Ambulatory Visit | Attending: Acute Care | Admitting: Acute Care

## 2017-12-09 DIAGNOSIS — J9610 Chronic respiratory failure, unspecified whether with hypoxia or hypercapnia: Secondary | ICD-10-CM | POA: Insufficient documentation

## 2017-12-09 DIAGNOSIS — J449 Chronic obstructive pulmonary disease, unspecified: Secondary | ICD-10-CM | POA: Diagnosis not present

## 2017-12-09 DIAGNOSIS — Z930 Tracheostomy status: Secondary | ICD-10-CM | POA: Insufficient documentation

## 2017-12-09 IMAGING — CT CT PELVIS W/O CM
2 of 3 series · 16 of 46 positions shown, 18 images · non-contrast
Comparison: Radiographs from 11/05/2015; pelvic CT from 12/31/2011

CLINICAL DATA: Quadriplegia, decubitus ulcers, assessment for
possible perineal fasciitis.

EXAM:
CT PELVIS WITHOUT CONTRAST
TECHNIQUE: Multidetector CT imaging of the pelvis was performed following the
standard protocol without intravenous contrast.

[Series 2: pelvis st · axial · 0.80mm/px · z∈[-384,-184]mm · 13 of 48 slices shown, 15 images]
[im 4/48  soft-tissue]
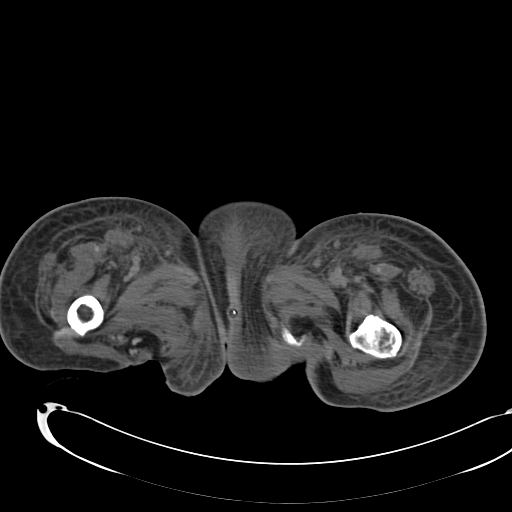
[im 4/48  bone]
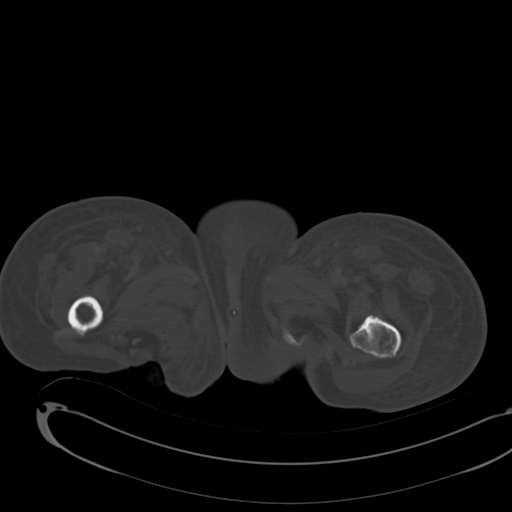
[im 7/48  soft-tissue]
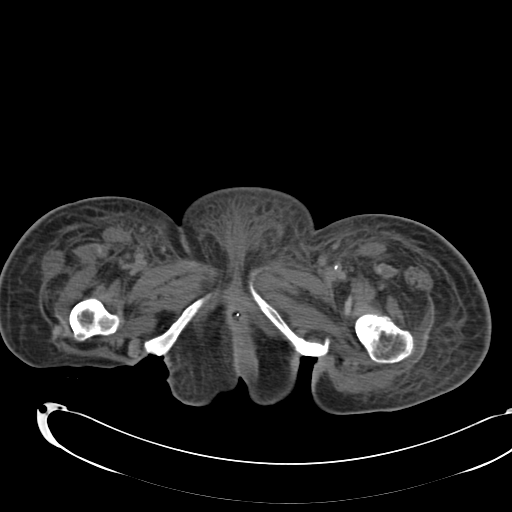
[im 10/48  soft-tissue]
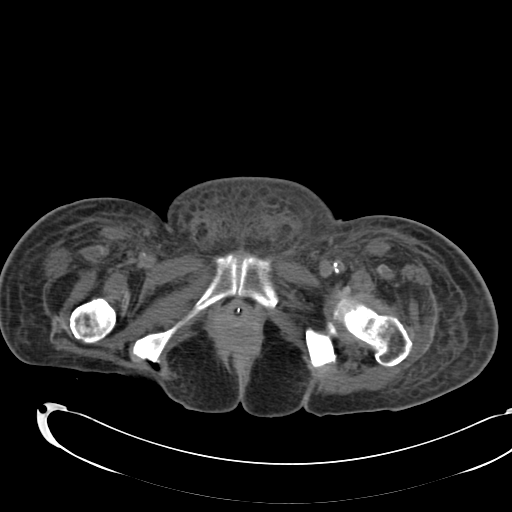
[im 14/48  soft-tissue]
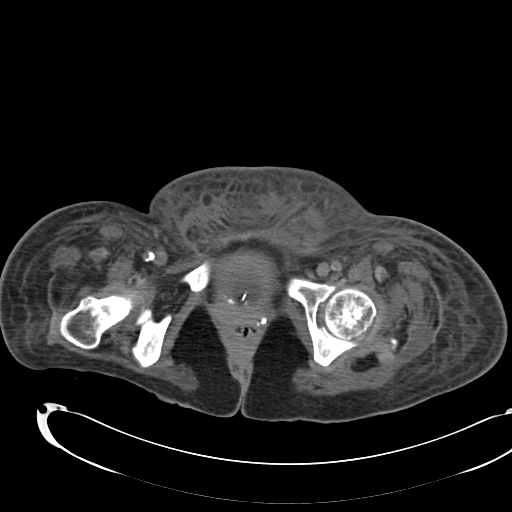
[im 17/48  soft-tissue]
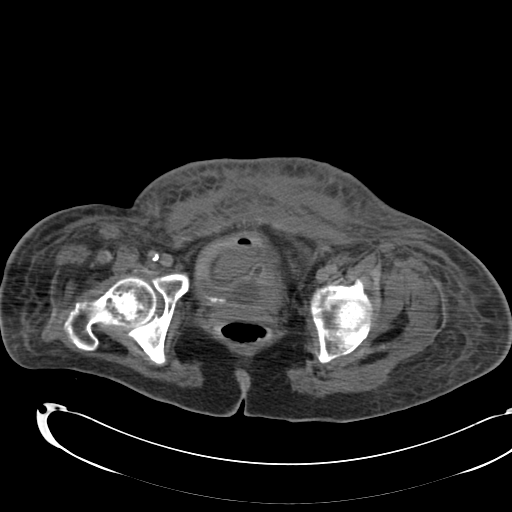
[im 20/48  soft-tissue]
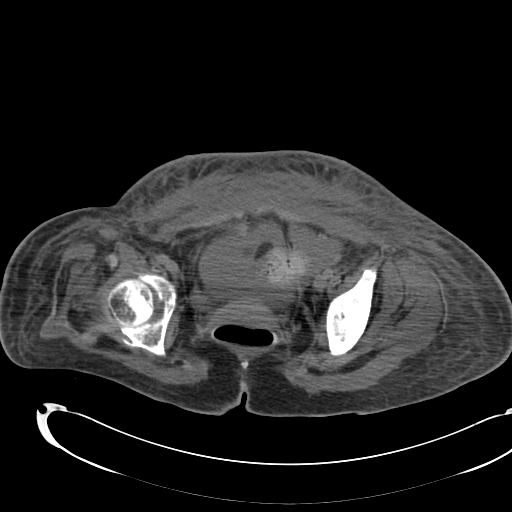
[im 25/48  soft-tissue]
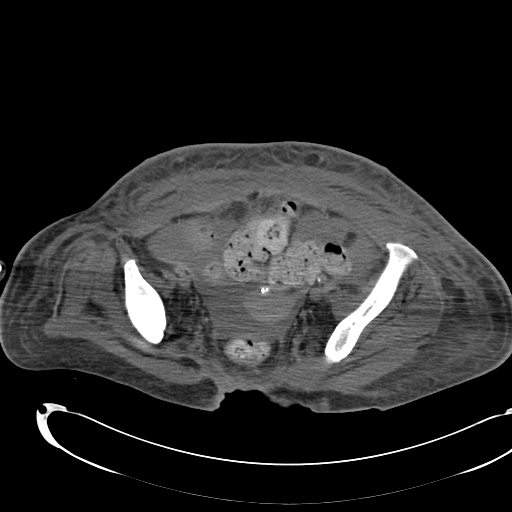
[im 28/48  soft-tissue]
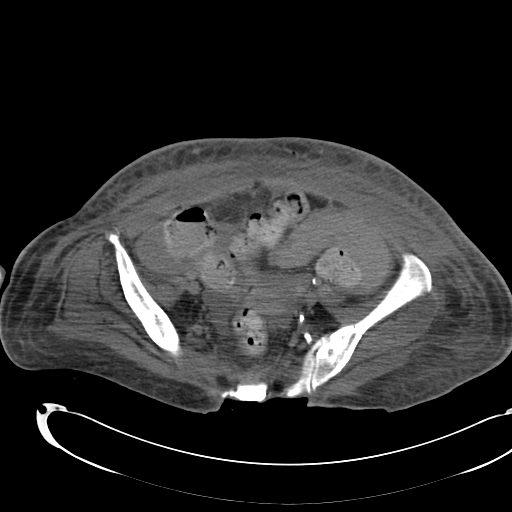
[im 31/48  soft-tissue]
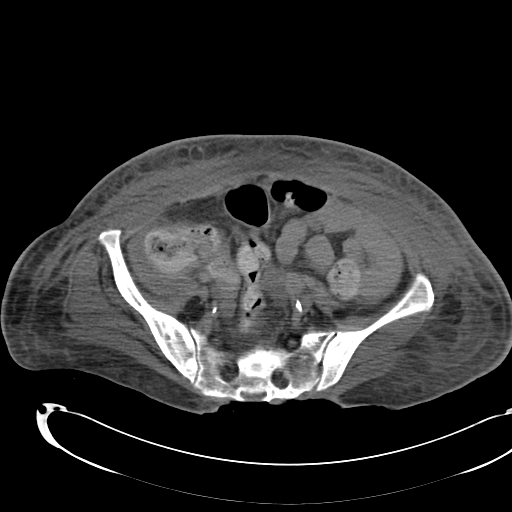
[im 31/48  bone]
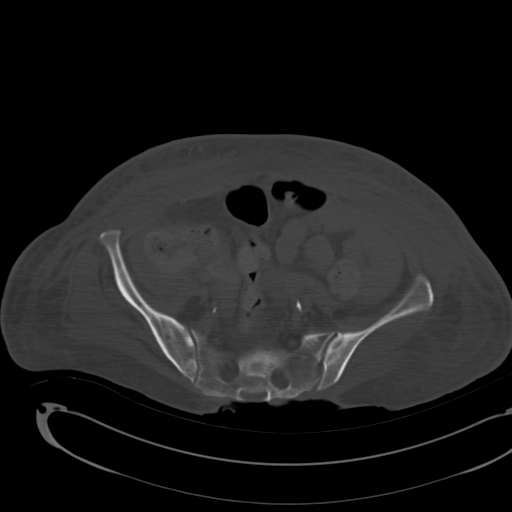
[im 34/48  soft-tissue]
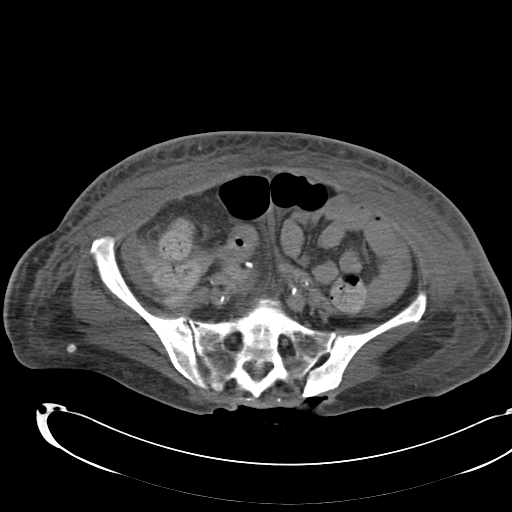
[im 38/48  soft-tissue]
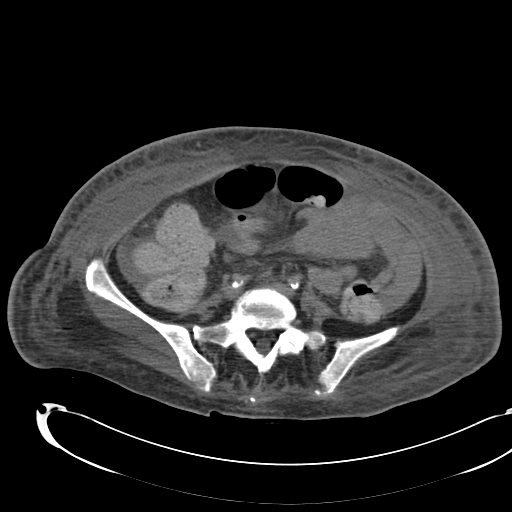
[im 41/48  soft-tissue]
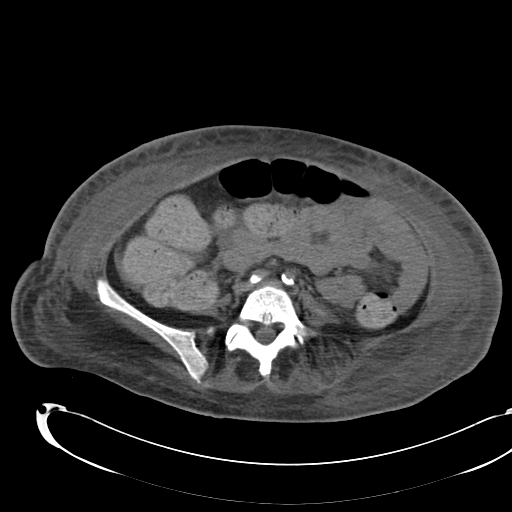
[im 44/48  soft-tissue]
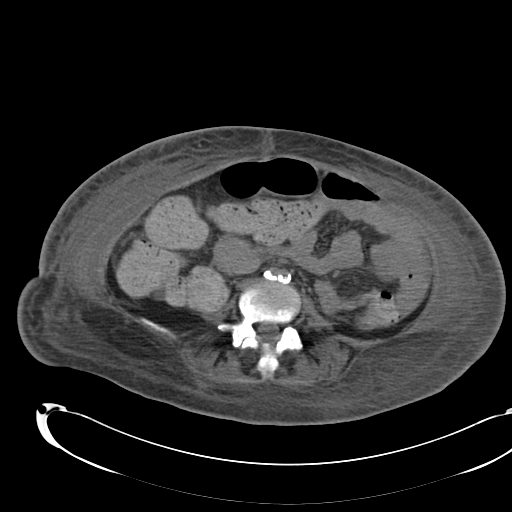

[Series 602: cor · coronal · 0.80mm/px · 3 of 121 slices shown]
[im 41/121  soft-tissue]
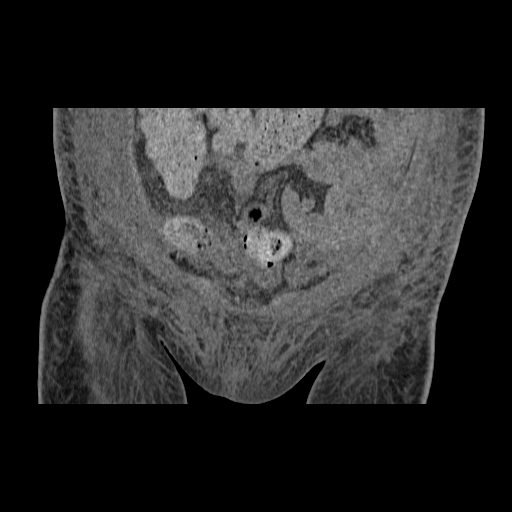
[im 54/121  soft-tissue]
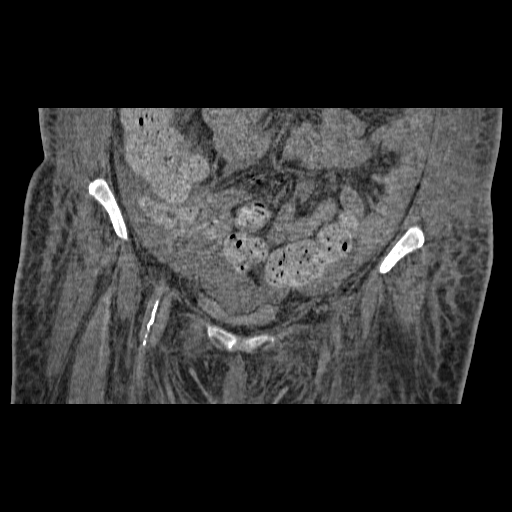
[im 67/121  soft-tissue]
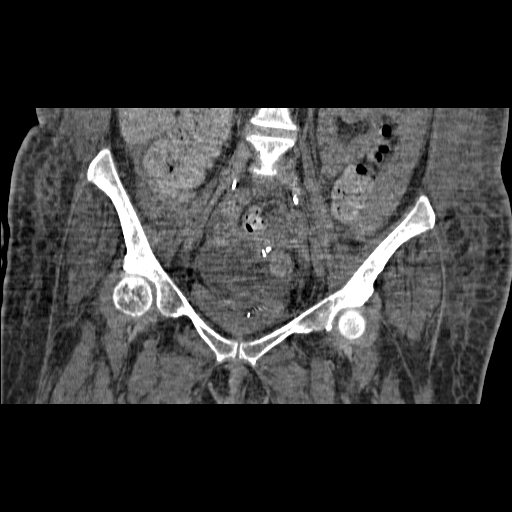

[16 of 46 positions shown; findings below may reference images not displayed]

FINDINGS: Urinary Tract: Foley catheter in the urinary bladder. Tiny
calcifications in the right-sided the urinary bladder compatible
with small bladder calculi.

Bowel: Mild prominence of stool in the colon. No dilated small bowel
in the pelvis.

Vascular/Lymphatic: Dense atherosclerotic calcification.

Reproductive: Calcification along the anterior uterine margin
probably from a fibroid.

Other: Extensive subcutaneous edema along the lower abdomen and
pelvis. Moderate pelvic ascites.

Musculoskeletal: Stage IV decubitus ulcer along the posterior sacrum
extending to the bony margin. Stage IV decubitus ulcers overlying
both ischial tuberosities, extending to the bony margin. Sclerosis
of the left ischial tuberosity extending into the posterior wall of
the left acetabulum compatible with chronic osteomyelitis. I would
be surprised if there is not also chronic osteomyelitis of the right
ischium and of the sacrum. Bony spurring of the posterior wall of
the right acetabulum. Patchy osteoporosis in the lower pelvis and
hips, small left hip joint effusion. Abnormal edema tracks into the
mons pubis, labia, and upper thigh regions, an asymmetric fashion,
an without gas infiltration to suggest emphysematous fasciitis.
IMPRESSION: 1. There is no evidence of emphysematous fasciitis along the
perineum. The appearance at earlier radiography was likely either
due to the considerable subcutaneous stranding outlining lobules of
adipose tissue and simulating gas, or possibly fluid within a diaper
or garment along the perineum.
2. There are stage IV decubitus ulcers along the sacrum and ischial
tuberosities, associated with chronic osteomyelitis, most apparent
in the left ischium.
3. Diffuse subcutaneous edema and third spacing of fluid including
mesenteric edema and moderate pelvic ascites.
4. Small bladder calculi are present.
5.  Aortoiliac atherosclerotic vascular disease.

## 2017-12-09 IMAGING — DX DG CHEST 1V PORT
1 series · 1 of 1 positions shown · non-contrast
Comparison: November 04, 2015.

CLINICAL DATA: Hypoxia

EXAM:
PORTABLE CHEST 1 VIEW

[chest ap]
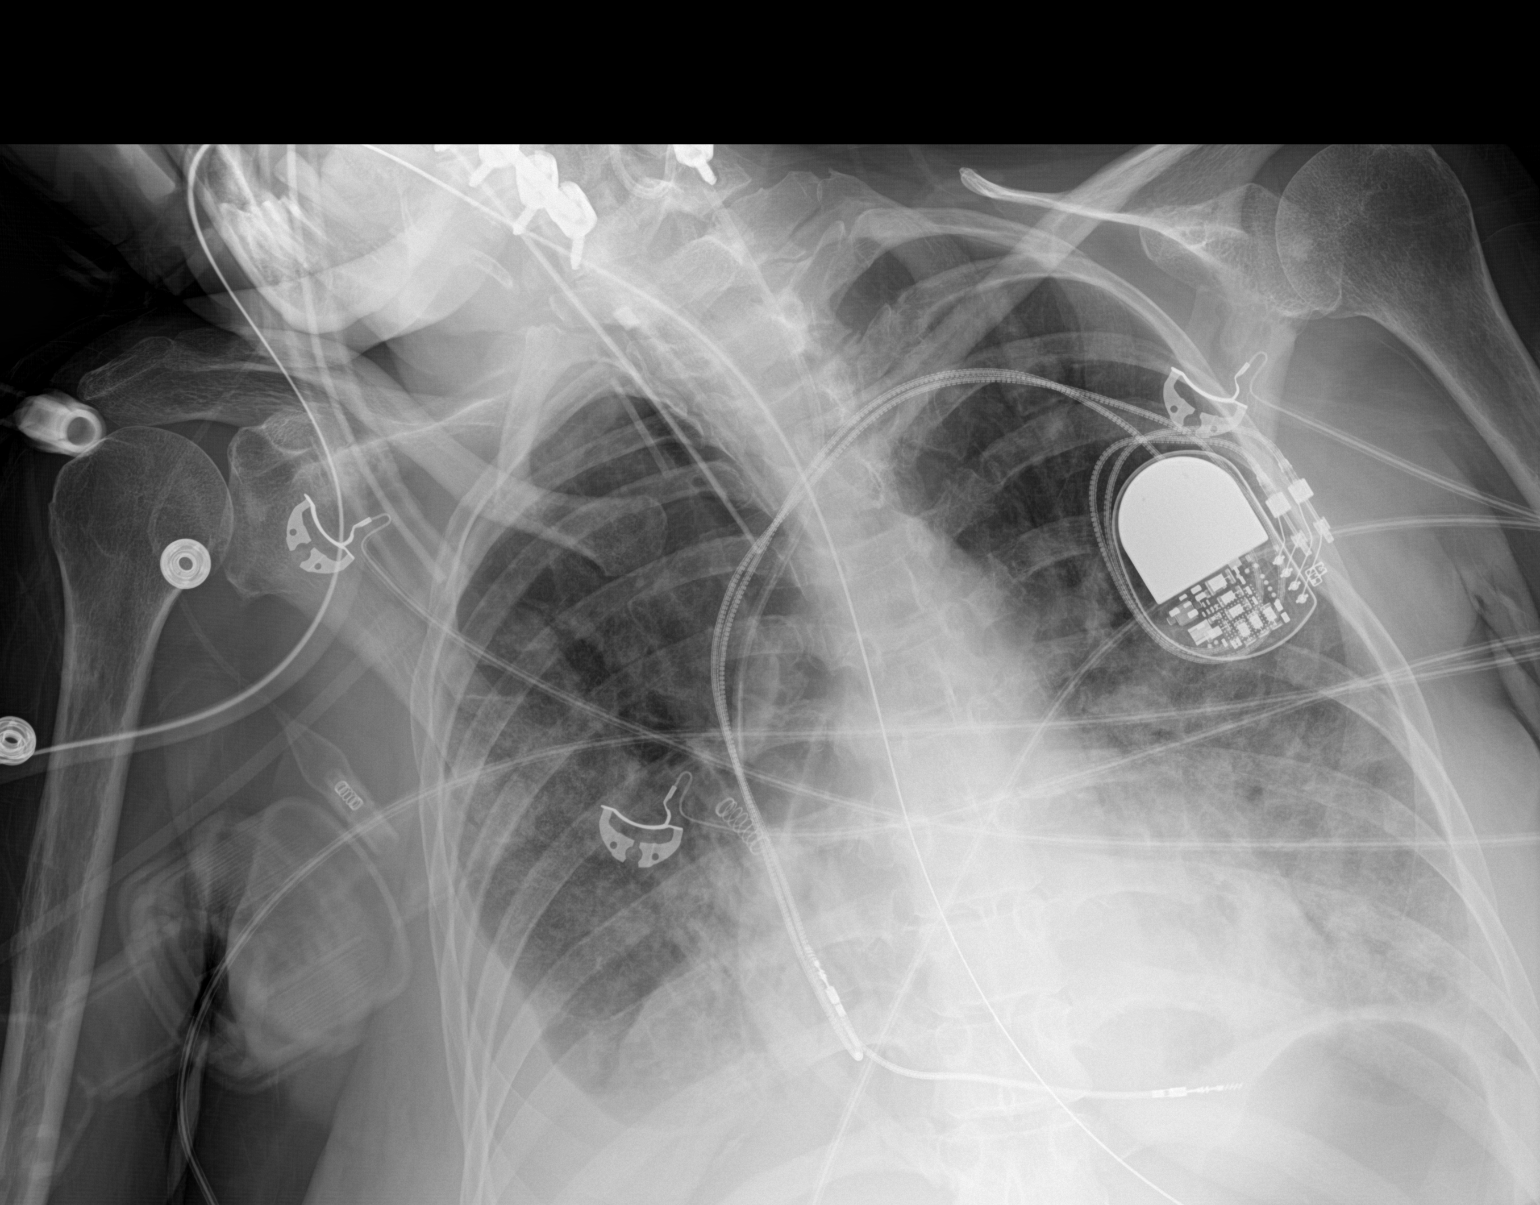

[1 of 1 positions shown; findings below may reference images not displayed]

FINDINGS: Endotracheal tube tip is 3.5 cm above the carina. Nasogastric tube
tip and side port are below the diaphragm. Pacemaker leads are
attached to the right atrium and right ventricle. No evident
pneumothorax. There is a right pleural effusion which appears
partially loculated, stable. There is interstitial and patchy
alveolar edema in the mid and lower lung zones bilaterally. Heart is
borderline prominent with pulmonary vascularity within normal
limits. There is postoperative change in the cervical spine.
IMPRESSION: Tube and pacemaker lead positions as described without pneumothorax.
Right pleural effusion with areas of interstitial and patchy
alveolar edema. Suspect a degree of congestive heart failure. A
degree of superimposed pneumonia cannot be excluded
radiographically. The appearance of the lungs and cardiac silhouette
is stable compared to 1 day prior.

## 2017-12-09 IMAGING — DX DG ABD PORTABLE 1V
2 series · 2 of 2 positions shown · non-contrast
Comparison: 05/07/2015.

ADDENDUM:
The original report was by Dr. Pikaczu Ebert. The following
addendum is by Dr. Pikaczu Ebert:

These results were called by telephone at the time of interpretation
on 11/05/2015 at [DATE] to Dr. ANGELO ALEJANDRO ARREDONDO , who verbally
acknowledged these results.
CLINICAL DATA: Constipation. Distended abdomen. Hx asthma, GERD,
quadriparesis, appendectomy
EXAM:
PORTABLE ABDOMEN - 1 VIEW

[abdomen kub (1 of 2)]
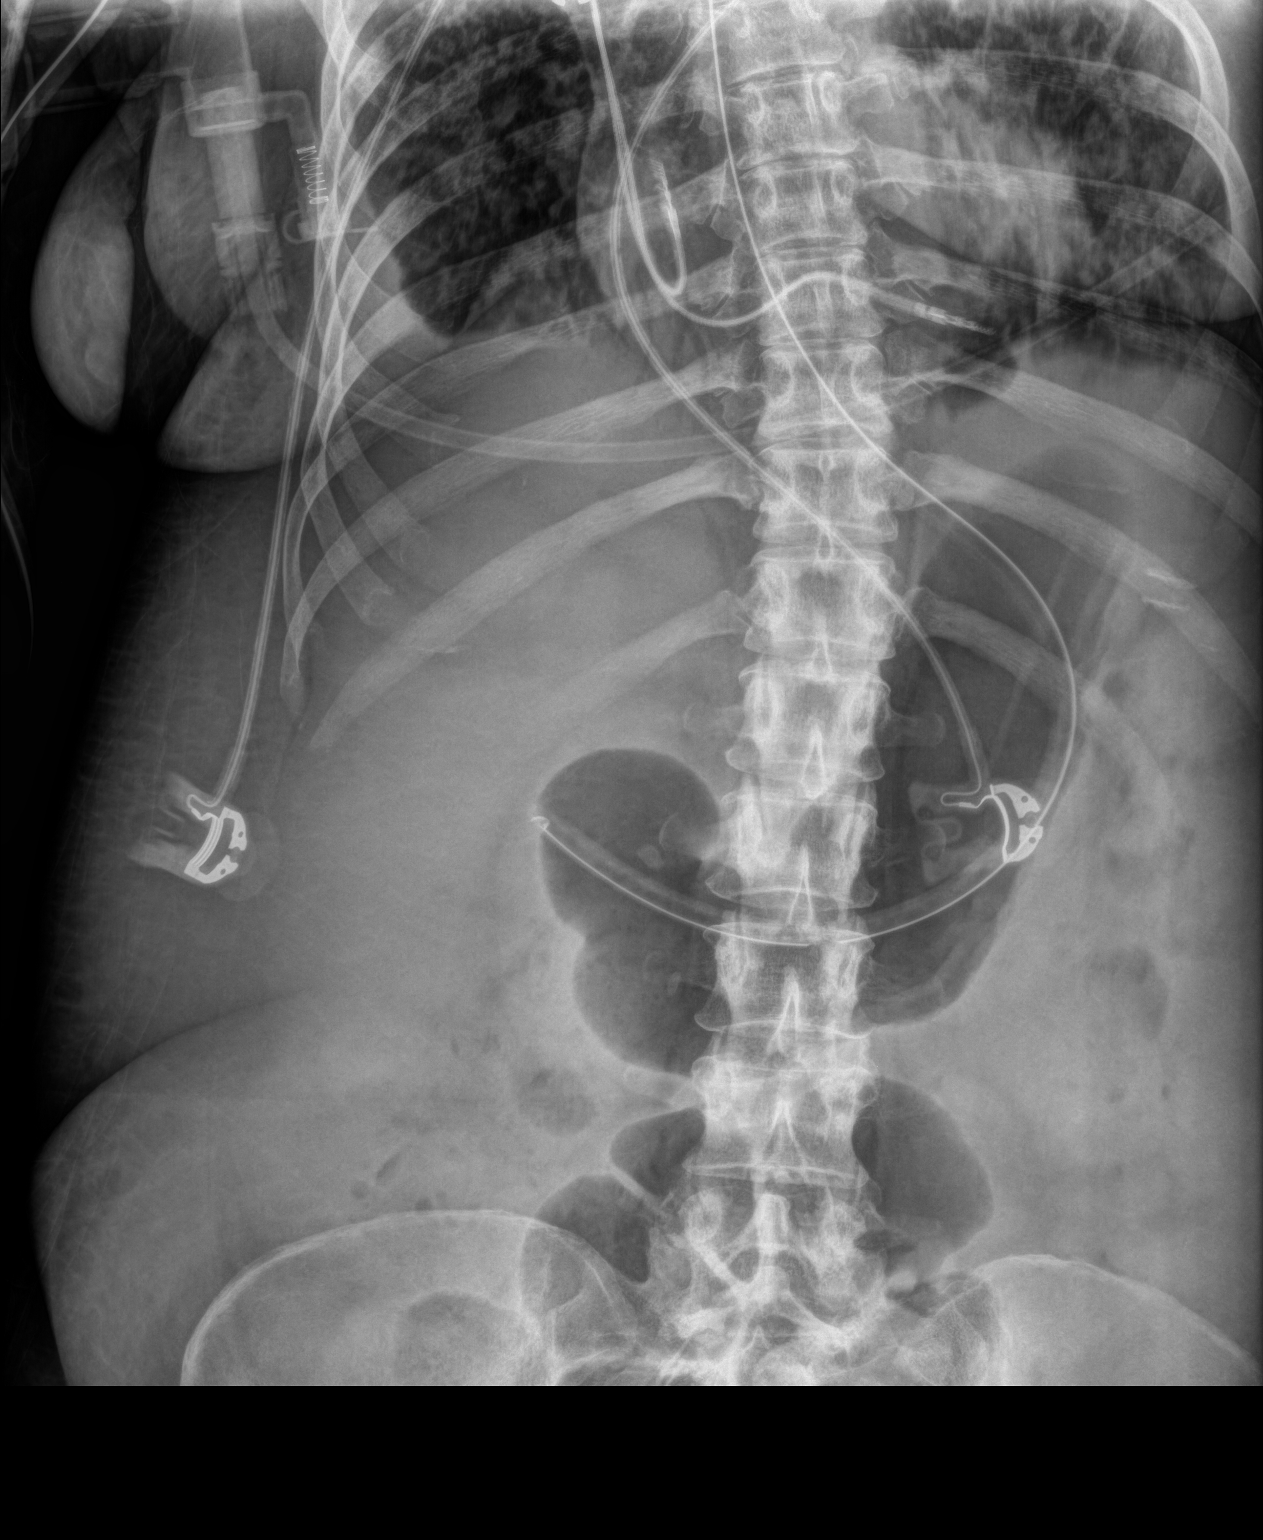

[abdomen kub (2 of 2)]
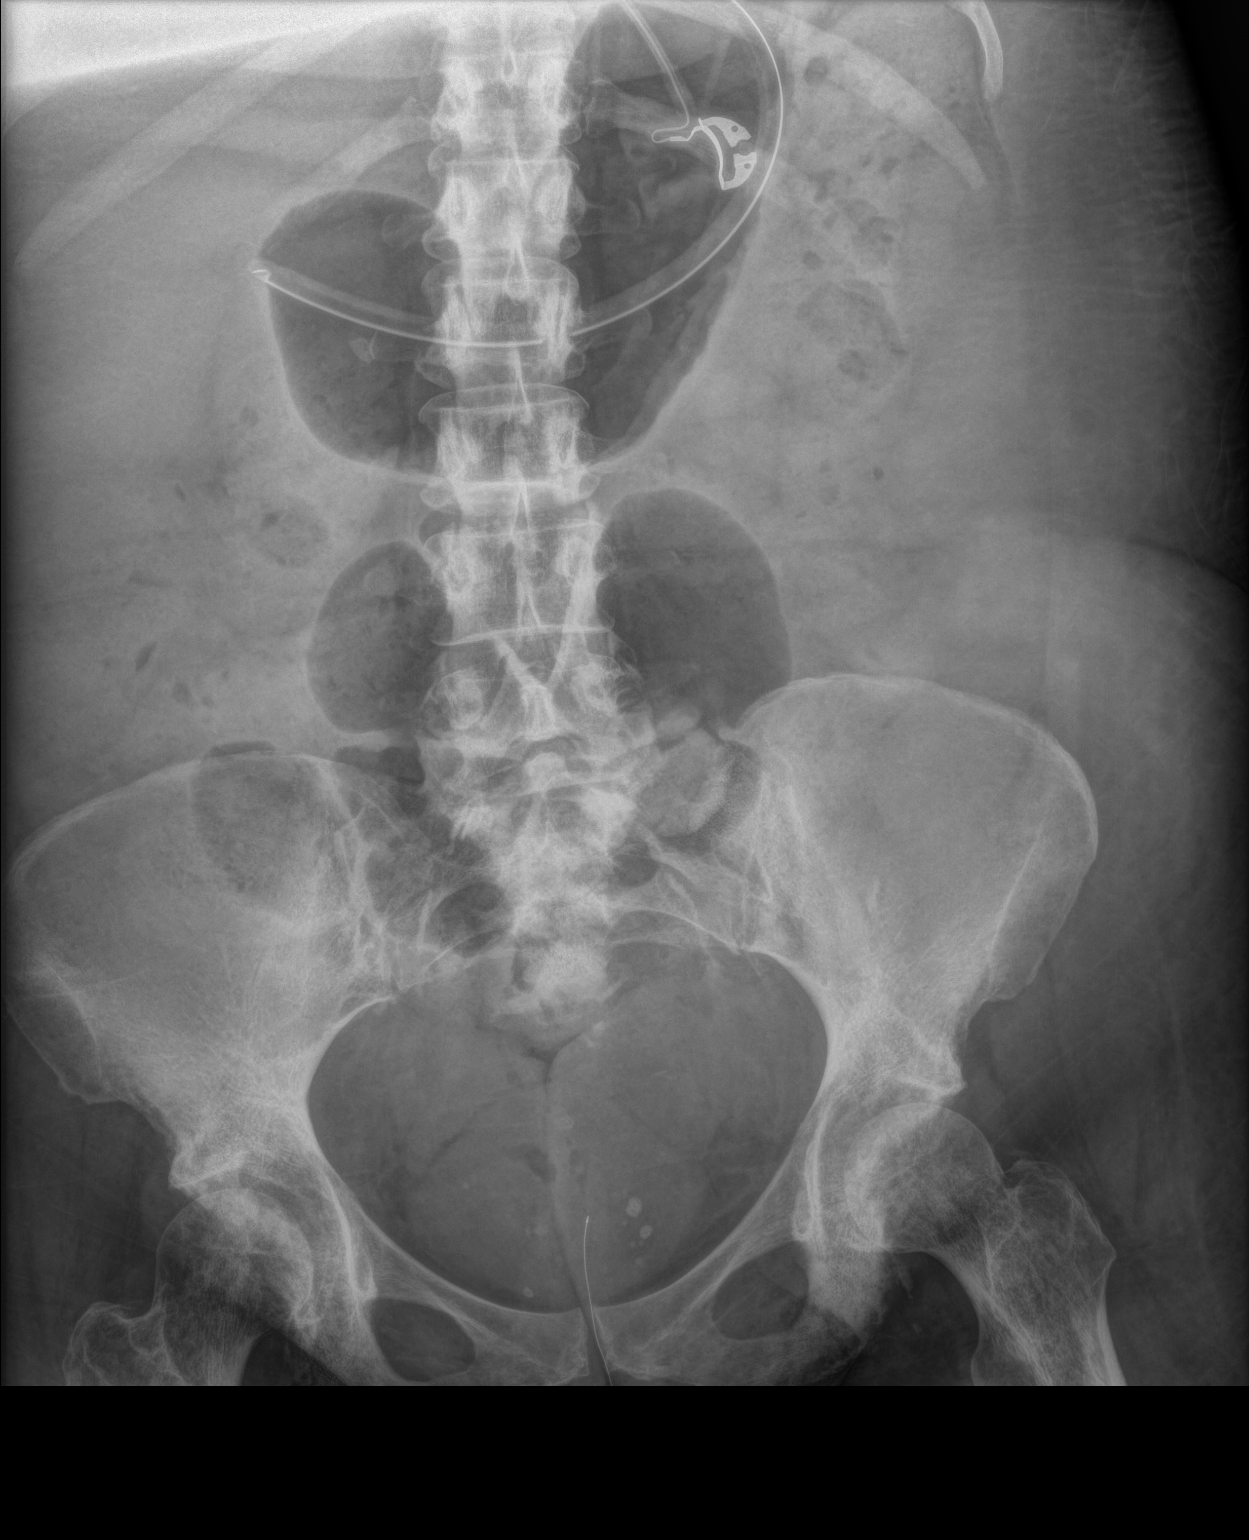

[2 of 2 positions shown; findings below may reference images not displayed]

FINDINGS: Nasogastric tube noted, tip in the stomach antrum. Prominent stool
throughout the colon favors constipation. No dilated small bowel
identified.

New blunting of the right lateral costophrenic angle. Interstitial
accentuation at the lung bases the. Pacer leads project over the
cardiac shadow.

Bony demineralization in the lower pelvis and hips. Streaky linear
lucencies -possibly gas density- along the pelvic soft tissues of
the perineum.
IMPRESSION: 1. Questionable gas densities along the perineum, I cannot exclude
fasciitis or decubitus ulcers involving the perineum and underlying
the ischial tuberosities. Conceivably similar density could be
caused by partially wet diaper or clothing. Fasciitis involving the
pelvic floor can be fulminant than life-threatening, CT of the
pelvis should be considered for further characterization.
2. Patchy lucencies in both hips and in the lower pelvis, some of
this may represent gas density projecting over the hips, and some
may be reactive.
3.  Prominent stool throughout the colon favors constipation.
4. Right pleural effusion with bibasilar indistinct densities.
Radiology assistant personnel have been notified to put me in
telephone contact with the referring physician or the referring
physician's clinical representative in order to discuss these
findings. Once this communication is established I will issue an
addendum to this report for documentation purposes.

## 2017-12-09 MED ORDER — LACTULOSE 20 GM/30ML PO SOLN: 20.0000 g | Freq: Two times a day (BID) | ORAL | 11 refills | Status: DC

## 2017-12-09 NOTE — Progress Notes (Signed)
[facility]   Reason for visit:  Routine trach care  HPI:   This is a 47 year old quadriplegic female with history of tobacco abuse which is ongoing, COPD, and chronic respiratory failure.  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  She has around-the-clock nursing care, and I now see her on a every six-month basis for tracheostomy assessment and routine trach care, her nursing staff changed her tracheostomy at the 67-month timeframe in between visits with me.  She returns today for routine tracheostomy change and assessment  ROS  Review of Systems - History obtained from the patient General ROS: negative Psychological ROS: negative for - anxiety or depression ENT ROS: negative for - headaches, nasal discharge, sinus pain, sneezing, sore throat or trach drainage.  Hematological and Lymphatic ROS: negative Endocrine ROS: negative Respiratory ROS: no cough, shortness of breath, or wheezing Cardiovascular ROS: no chest pain or dyspnea on exertion Gastrointestinal ROS: no abdominal pain, change in bowel habits, or black or bloody stools Musculoskeletal ROS: negative Neurological ROS: no TIA or stroke symptoms Dermatological ROS: positive for skin lesion changes  Vital signs:  Reviewed and stable  Exam:  Physical Exam  Constitutional: She is oriented to person, place, and time. She appears well-developed. No distress.  HENT:  Head: Normocephalic and atraumatic.  Nose: Nose normal.  Mouth/Throat: No oropharyngeal exudate.  Eyes: Pupils are equal, round, and reactive to light.  Neck: Normal range of motion.  The tracheostomy stoma is unremarkable  Cardiovascular: Normal rate, regular rhythm and normal heart sounds. Exam reveals no friction rub.  No murmur heard. Pulmonary/Chest: Effort normal and breath sounds normal.  Abdominal: Soft. Bowel sounds are normal.  Neurological:  She is alert and oriented to person, place, and time.  Skin: Skin is warm and dry. Capillary refill takes less than 2 seconds. She is not diaphoretic.  Psychiatric: She has a normal mood and affect.    Trach change/procedure:  A size 6 cuffless tracheostomy was removed.  Tracheostomy stoma was unremarkable, she did have significant pitting on the tracheostomy itself with apparent biofilm otherwise unremarkable.  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  No issues from tracheostomy.  We can continue our current regimen of every 54-month visits, with nursing changing trach at home at 58-month mark Plan  Return in 6 months Encouraged to call with problems     Visit time: 31 minutes.   Erick Colace ACNP-BC [place]

## 2017-12-09 NOTE — Progress Notes (Signed)
Tracheostomy Procedure Note  Angela Page [id number] Aug 07, 1970  Pre Procedure Tracheostomy Information  Trach Brand: Shiley Size: 6.0 Style: Uncuffed Secured by: Velcro   Procedure: Trach change    Post Procedure Tracheostomy Information  Trach Brand: Shiley Size: 6.0 Style: Uncuffed Secured by: Velcro   Post Procedure Evaluation:  ETCO2 positive color change from yellow to purple : Yes.   Vital signs, pulse 63, respirations 22 and pulse oximetry 93 % Patients current condition: stable Complications: No apparent complications Trach site exam: clean, dry, no drainage, healed Wound care done: dry, sterile and 4 x 4 gauze Patient did tolerate procedure well.   Education: none  Prescription needs: none    Additional needs: none

## 2017-12-09 NOTE — Telephone Encounter (Signed)
I have sent new prescription to patients pharmacy. Left message for patient to call back to let her know.

## 2017-12-09 NOTE — Telephone Encounter (Signed)
Aaron Edelman called to request call back from Dr Alcario Drought nurse. [phone]

## 2017-12-10 NOTE — Telephone Encounter (Signed)
Have re faxed form

## 2017-12-11 IMAGING — DX DG CHEST 1V PORT
1 series · 1 of 1 positions shown · non-contrast
Comparison: Chest radiograph dated 11/05/2015

CLINICAL DATA: 45-year-old female status post intubation.

EXAM:
PORTABLE CHEST 1 VIEW

[chest ap]
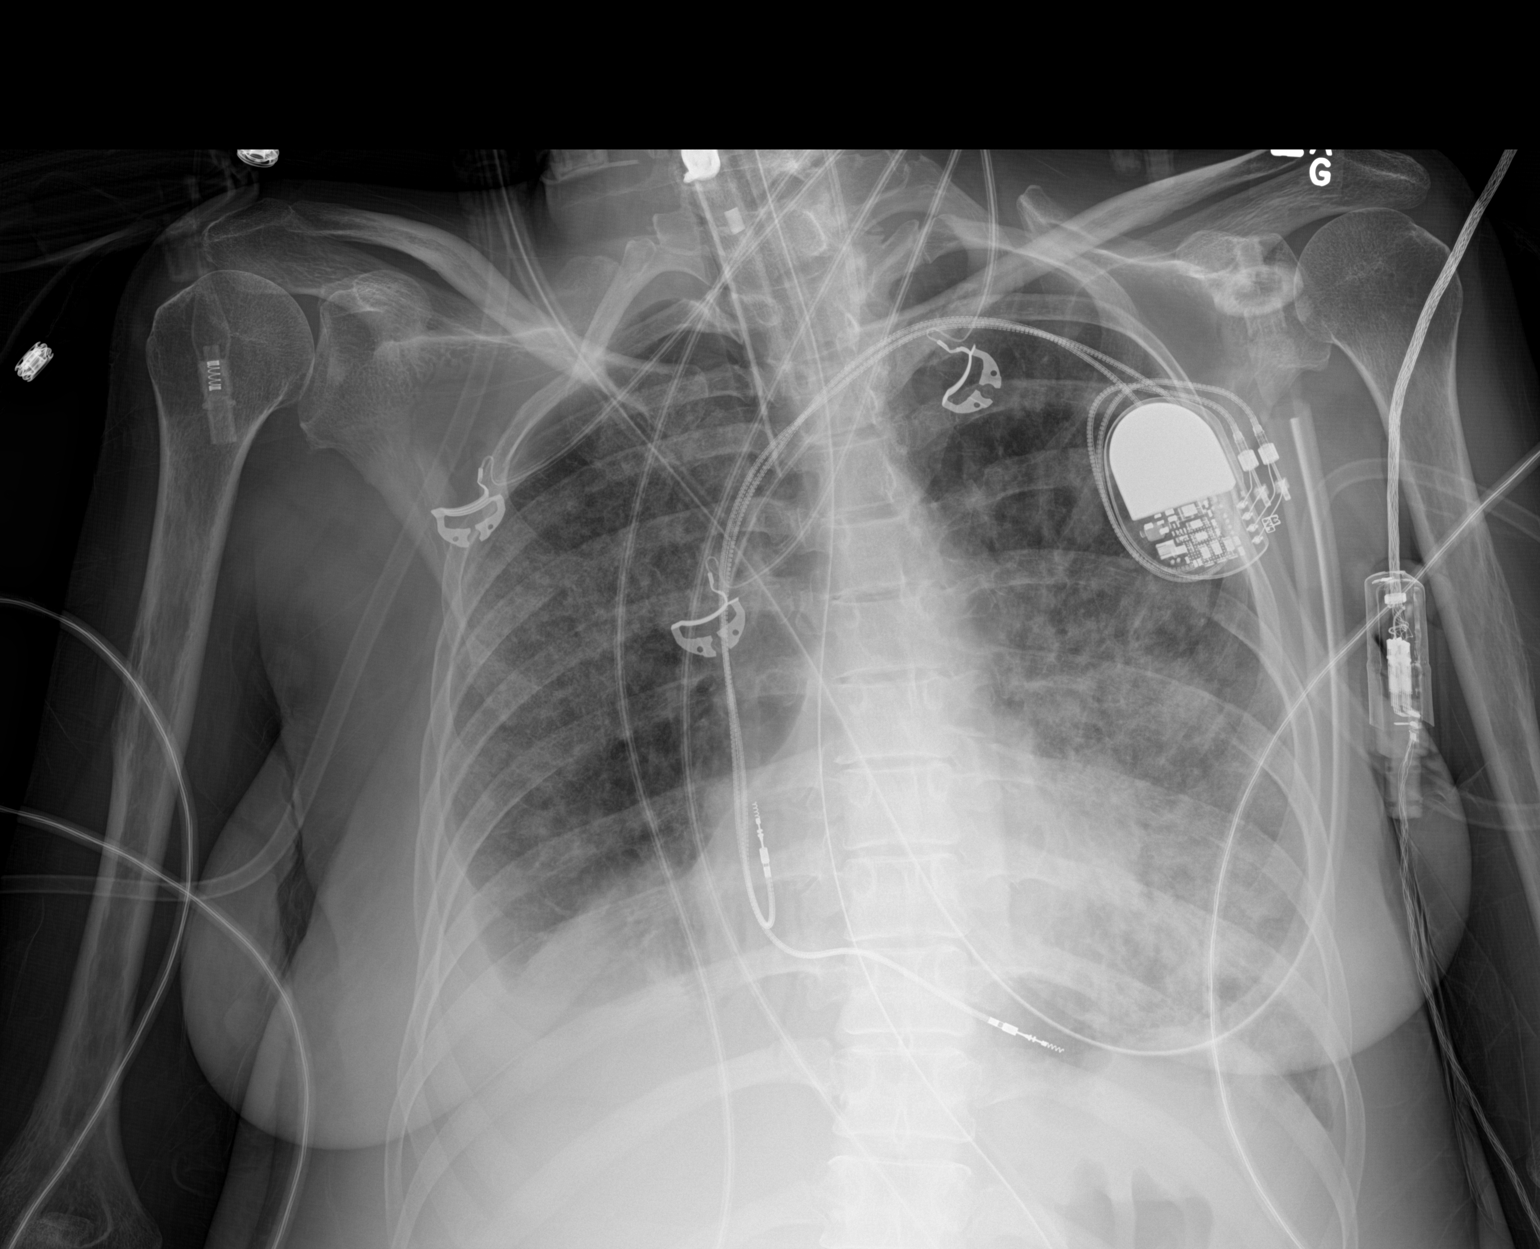

[1 of 1 positions shown; findings below may reference images not displayed]

FINDINGS: Endotracheal tube the tip approximately 4.3 cm above the carina.
Enteric tube courses into the left upper abdomen with tip beyond the
inferior margin of the image. Bilateral mid to lower lung field
airspace opacities, left greater right again noted. There small
bilateral pleural effusions. No pneumothorax. Stable cardiac
silhouette with left pectoral pacemaker device. Cervical spine
fusion screws. No acute fracture.
IMPRESSION: Endotracheal tube above the carina.

Bilateral interstitial and airspace densities, left greater right
with no significant interval change since the prior radiograph.
Small bilateral pleural effusions, right greater left.

## 2017-12-12 IMAGING — DX DG CHEST 1V PORT
1 series · 1 of 1 positions shown · non-contrast
Comparison: 11/07/2015.

CLINICAL DATA: Respiratory failure.

EXAM:
PORTABLE CHEST 1 VIEW

[chest ap]
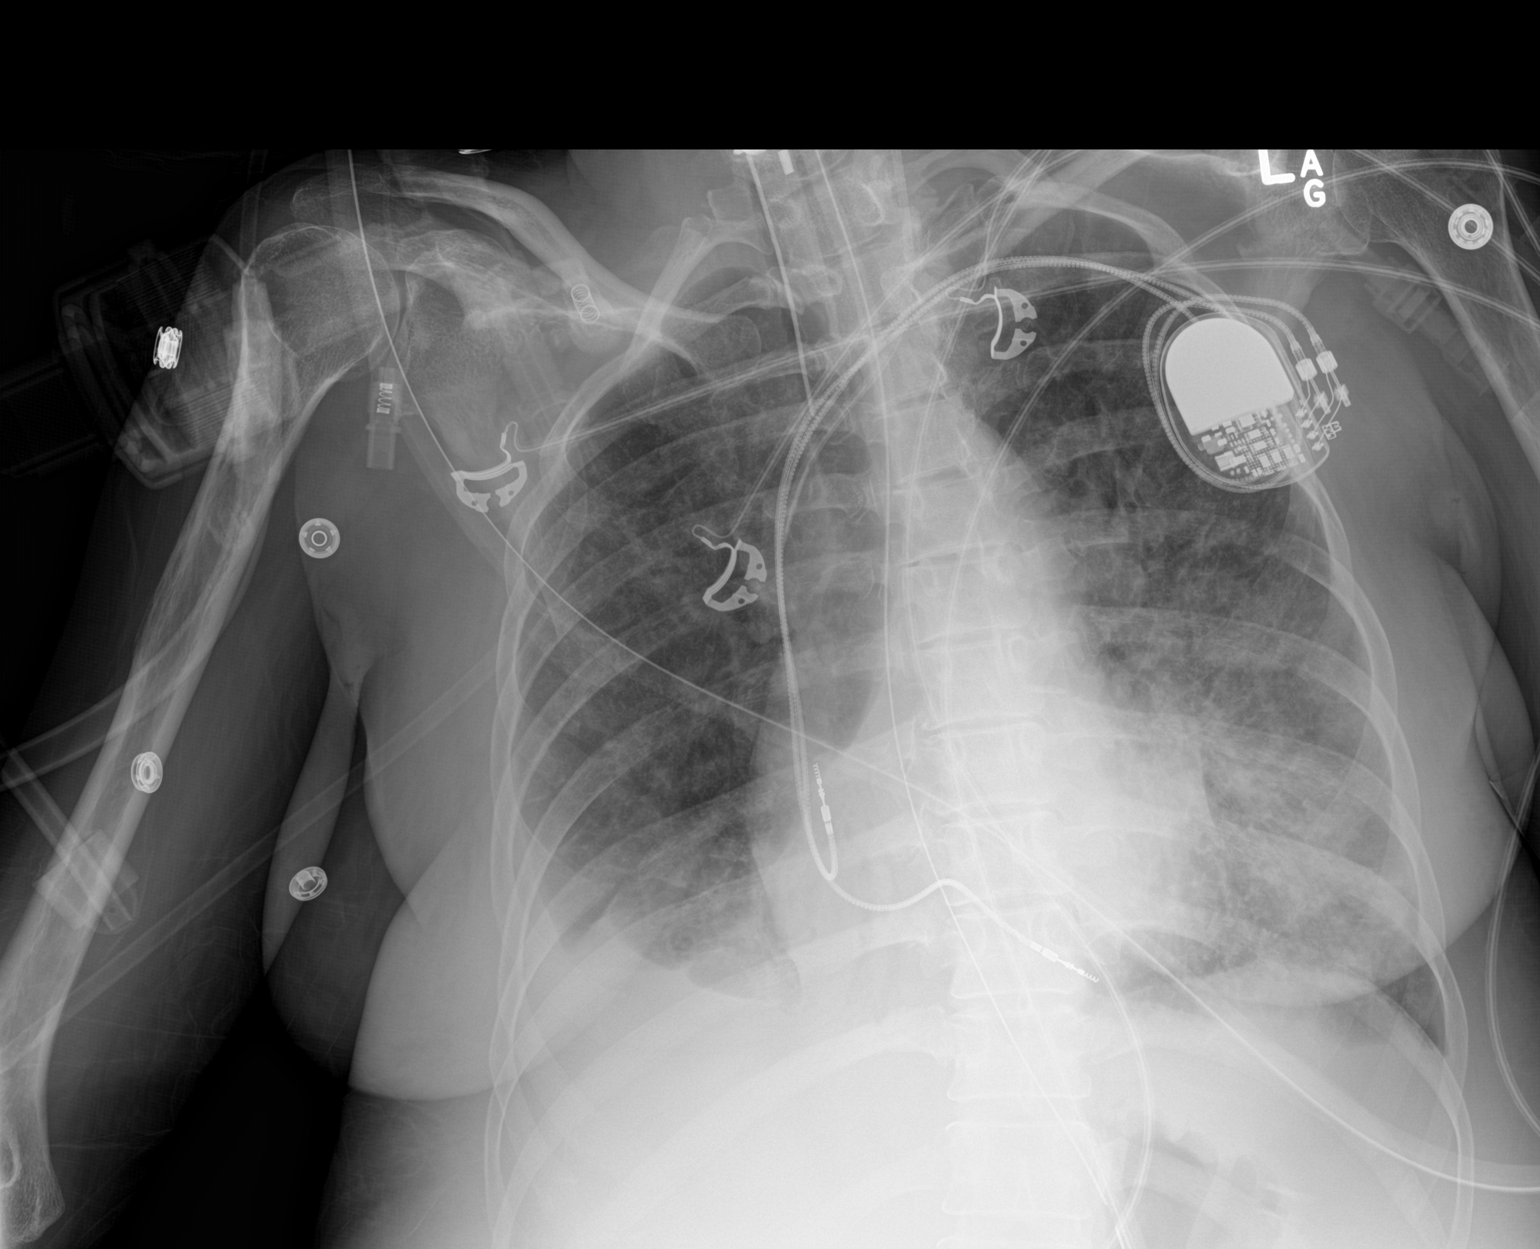

[1 of 1 positions shown; findings below may reference images not displayed]

FINDINGS: Endotracheal tube and NG tube in stable position. Cardiac pacer
stable position. Heart size stable. Mild bilateral scratch chest
diffuse bilateral airspace disease, no change. Small right pleural
effusion. Small bilateral pleural effusions. No change. No
pneumothorax.
IMPRESSION: 1. Lines and tubes in stable position.

2. Stable diffuse bilateral airspace disease small bilateral pleural
effusions. No change from prior exam P

## 2017-12-13 IMAGING — DX DG CHEST 1V PORT
1 series · 1 of 1 positions shown · non-contrast
Comparison: 11/09/2015

CLINICAL DATA: Endotracheal tube placement.

EXAM:
PORTABLE CHEST 1 VIEW

[chest ap]
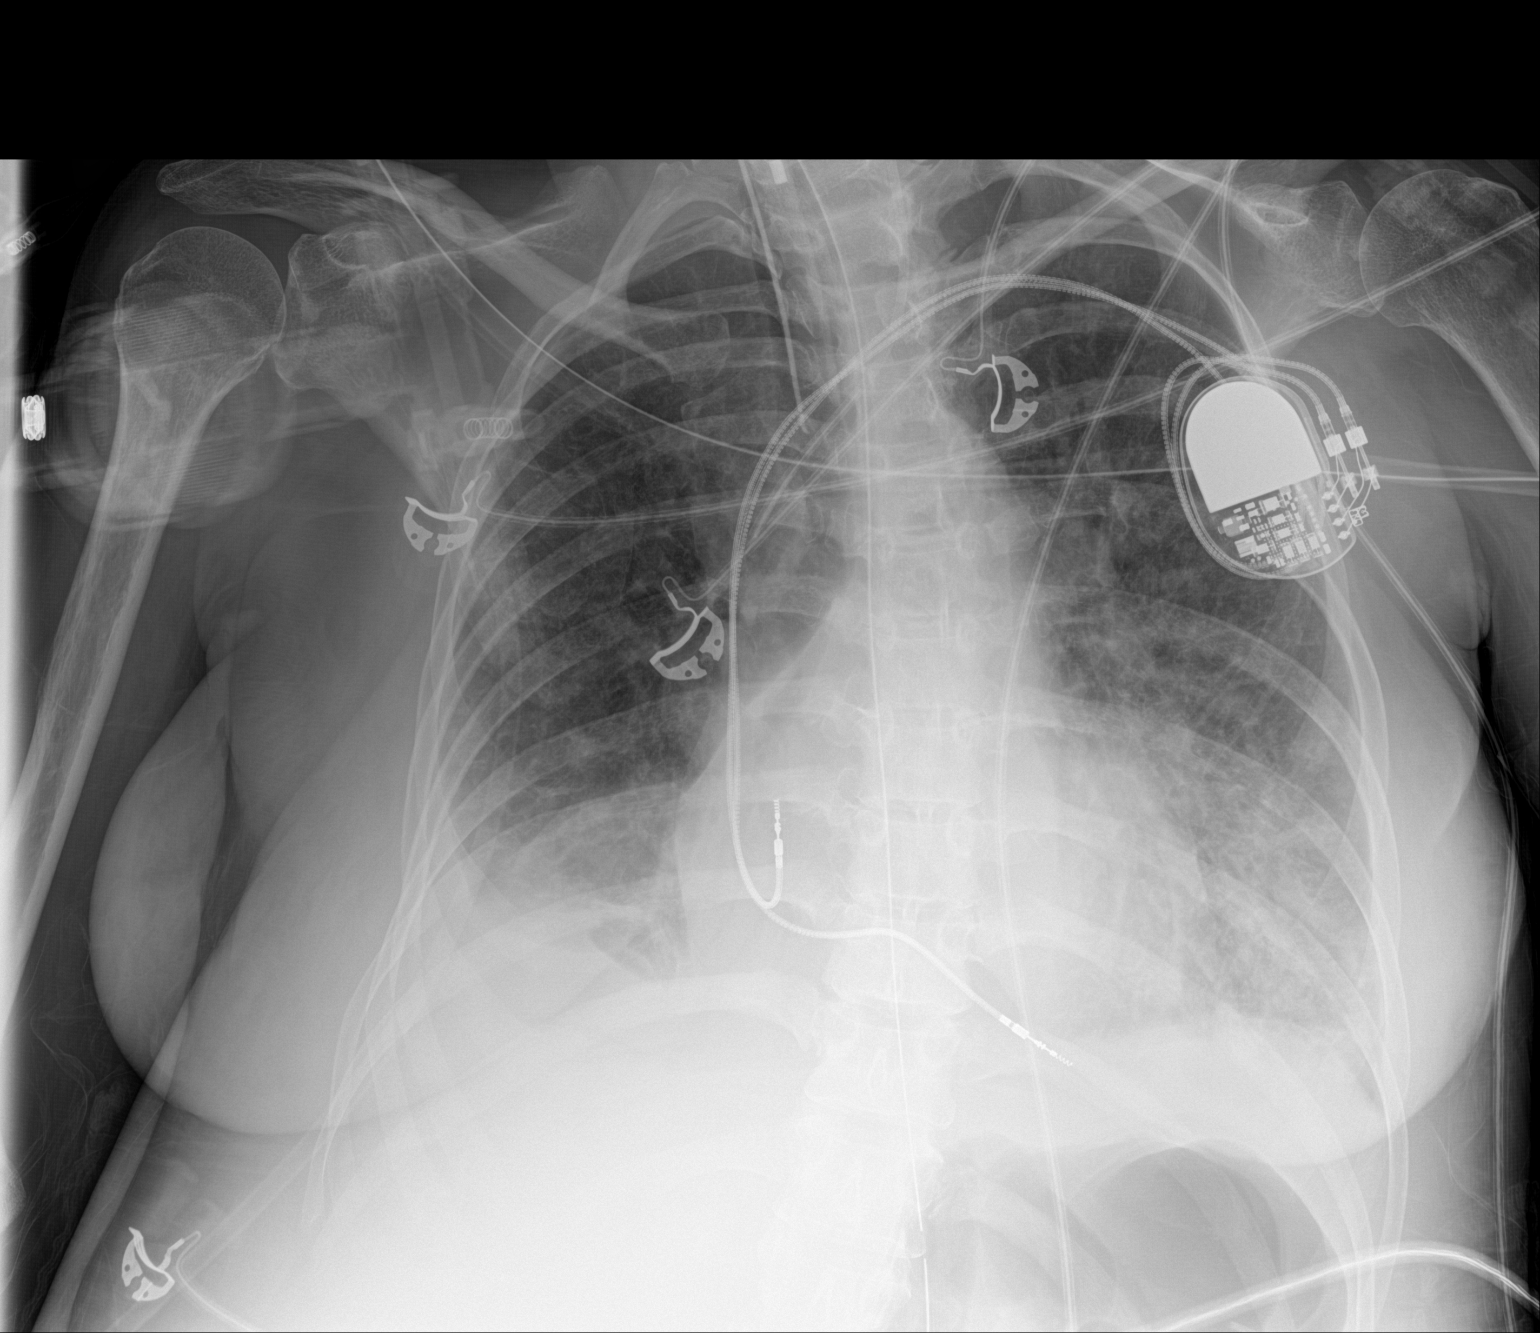

[1 of 1 positions shown; findings below may reference images not displayed]

FINDINGS: Endotracheal tube is 3.6 cm above the carina and appropriately
positioned. Nasogastric tube extends into the abdomen. Stable
position of the left dual-chamber cardiac pacemaker. Again noted are
patchy airspace densities in the lower lungs. Evidence for right
pleural fluid. Negative for a pneumothorax. There is a left jugular
central venous catheter and the tip is near the junction of the left
innominate vein and SVC.
IMPRESSION: Endotracheal tube is appropriately positioned. Support apparatuses
as described.

Persistent airspace densities in the lower lungs. Findings could
represent edema versus pneumonia. Minimal change from the previous
examination.

Right pleural effusion.

## 2017-12-16 ENCOUNTER — Emergency Department (HOSPITAL_COMMUNITY): Payer: Medicare Other

## 2017-12-16 ENCOUNTER — Encounter (HOSPITAL_COMMUNITY): Payer: Self-pay | Admitting: Emergency Medicine

## 2017-12-16 ENCOUNTER — Inpatient Hospital Stay (HOSPITAL_COMMUNITY): Payer: Medicare Other

## 2017-12-16 ENCOUNTER — Inpatient Hospital Stay (HOSPITAL_COMMUNITY)
Admission: EM | Admit: 2017-12-16 | Discharge: 2017-12-31 | DRG: 637 | Disposition: A | Discharge status: Other Acute Inpatient Hospital | Payer: Medicare Other | Attending: Internal Medicine | Admitting: Internal Medicine

## 2017-12-16 DIAGNOSIS — E870 Hyperosmolality and hypernatremia: Secondary | ICD-10-CM | POA: Diagnosis not present

## 2017-12-16 DIAGNOSIS — E669 Obesity, unspecified: Secondary | ICD-10-CM | POA: Diagnosis present

## 2017-12-16 DIAGNOSIS — K567 Ileus, unspecified: Secondary | ICD-10-CM | POA: Diagnosis present

## 2017-12-16 DIAGNOSIS — Z87442 Personal history of urinary calculi: Secondary | ICD-10-CM

## 2017-12-16 DIAGNOSIS — N390 Urinary tract infection, site not specified: Secondary | ICD-10-CM | POA: Diagnosis not present

## 2017-12-16 DIAGNOSIS — K9423 Gastrostomy malfunction: Secondary | ICD-10-CM

## 2017-12-16 DIAGNOSIS — J9621 Acute and chronic respiratory failure with hypoxia: Secondary | ICD-10-CM | POA: Diagnosis present

## 2017-12-16 DIAGNOSIS — D72829 Elevated white blood cell count, unspecified: Secondary | ICD-10-CM | POA: Diagnosis present

## 2017-12-16 DIAGNOSIS — Z931 Gastrostomy status: Secondary | ICD-10-CM | POA: Diagnosis not present

## 2017-12-16 DIAGNOSIS — G9340 Encephalopathy, unspecified: Secondary | ICD-10-CM | POA: Diagnosis not present

## 2017-12-16 DIAGNOSIS — Z981 Arthrodesis status: Secondary | ICD-10-CM

## 2017-12-16 DIAGNOSIS — J9622 Acute and chronic respiratory failure with hypercapnia: Secondary | ICD-10-CM | POA: Diagnosis present

## 2017-12-16 DIAGNOSIS — R7989 Other specified abnormal findings of blood chemistry: Secondary | ICD-10-CM

## 2017-12-16 DIAGNOSIS — I5032 Chronic diastolic (congestive) heart failure: Secondary | ICD-10-CM | POA: Diagnosis present

## 2017-12-16 DIAGNOSIS — J9811 Atelectasis: Secondary | ICD-10-CM | POA: Diagnosis present

## 2017-12-16 DIAGNOSIS — R0902 Hypoxemia: Secondary | ICD-10-CM

## 2017-12-16 DIAGNOSIS — Z950 Presence of cardiac pacemaker: Secondary | ICD-10-CM

## 2017-12-16 DIAGNOSIS — Z801 Family history of malignant neoplasm of trachea, bronchus and lung: Secondary | ICD-10-CM

## 2017-12-16 DIAGNOSIS — J181 Lobar pneumonia, unspecified organism: Secondary | ICD-10-CM | POA: Diagnosis not present

## 2017-12-16 DIAGNOSIS — L89152 Pressure ulcer of sacral region, stage 2: Secondary | ICD-10-CM | POA: Diagnosis present

## 2017-12-16 DIAGNOSIS — Z79899 Other long term (current) drug therapy: Secondary | ICD-10-CM

## 2017-12-16 DIAGNOSIS — D473 Essential (hemorrhagic) thrombocythemia: Secondary | ICD-10-CM

## 2017-12-16 DIAGNOSIS — Z9359 Other cystostomy status: Secondary | ICD-10-CM

## 2017-12-16 DIAGNOSIS — Z79891 Long term (current) use of opiate analgesic: Secondary | ICD-10-CM

## 2017-12-16 DIAGNOSIS — R1310 Dysphagia, unspecified: Secondary | ICD-10-CM

## 2017-12-16 DIAGNOSIS — E872 Acidosis, unspecified: Secondary | ICD-10-CM | POA: Diagnosis present

## 2017-12-16 DIAGNOSIS — Z6829 Body mass index (BMI) 29.0-29.9, adult: Secondary | ICD-10-CM

## 2017-12-16 DIAGNOSIS — E876 Hypokalemia: Secondary | ICD-10-CM | POA: Diagnosis present

## 2017-12-16 DIAGNOSIS — J156 Pneumonia due to other aerobic Gram-negative bacteria: Secondary | ICD-10-CM | POA: Diagnosis not present

## 2017-12-16 DIAGNOSIS — Z720 Tobacco use: Secondary | ICD-10-CM

## 2017-12-16 DIAGNOSIS — J45909 Unspecified asthma, uncomplicated: Secondary | ICD-10-CM | POA: Diagnosis present

## 2017-12-16 DIAGNOSIS — D75839 Thrombocytosis, unspecified: Secondary | ICD-10-CM

## 2017-12-16 DIAGNOSIS — Z791 Long term (current) use of non-steroidal anti-inflammatories (NSAID): Secondary | ICD-10-CM

## 2017-12-16 DIAGNOSIS — T402X5A Adverse effect of other opioids, initial encounter: Secondary | ICD-10-CM | POA: Diagnosis present

## 2017-12-16 DIAGNOSIS — Z880 Allergy status to penicillin: Secondary | ICD-10-CM

## 2017-12-16 DIAGNOSIS — J15211 Pneumonia due to Methicillin susceptible Staphylococcus aureus: Secondary | ICD-10-CM | POA: Diagnosis not present

## 2017-12-16 DIAGNOSIS — G8250 Quadriplegia, unspecified: Secondary | ICD-10-CM | POA: Diagnosis not present

## 2017-12-16 DIAGNOSIS — Z1624 Resistance to multiple antibiotics: Secondary | ICD-10-CM | POA: Diagnosis not present

## 2017-12-16 DIAGNOSIS — E46 Unspecified protein-calorie malnutrition: Secondary | ICD-10-CM | POA: Diagnosis present

## 2017-12-16 DIAGNOSIS — N179 Acute kidney failure, unspecified: Secondary | ICD-10-CM | POA: Diagnosis present

## 2017-12-16 DIAGNOSIS — D509 Iron deficiency anemia, unspecified: Secondary | ICD-10-CM | POA: Diagnosis present

## 2017-12-16 DIAGNOSIS — J189 Pneumonia, unspecified organism: Secondary | ICD-10-CM

## 2017-12-16 DIAGNOSIS — Y848 Other medical procedures as the cause of abnormal reaction of the patient, or of later complication, without mention of misadventure at the time of the procedure: Secondary | ICD-10-CM | POA: Diagnosis not present

## 2017-12-16 DIAGNOSIS — J449 Chronic obstructive pulmonary disease, unspecified: Secondary | ICD-10-CM | POA: Diagnosis not present

## 2017-12-16 DIAGNOSIS — G8254 Quadriplegia, C5-C7 incomplete: Secondary | ICD-10-CM | POA: Diagnosis present

## 2017-12-16 DIAGNOSIS — E1101 Type 2 diabetes mellitus with hyperosmolarity with coma: Secondary | ICD-10-CM | POA: Diagnosis present

## 2017-12-16 DIAGNOSIS — L89890 Pressure ulcer of other site, unstageable: Secondary | ICD-10-CM | POA: Diagnosis present

## 2017-12-16 DIAGNOSIS — L89314 Pressure ulcer of right buttock, stage 4: Secondary | ICD-10-CM | POA: Diagnosis present

## 2017-12-16 DIAGNOSIS — E860 Dehydration: Secondary | ICD-10-CM | POA: Diagnosis present

## 2017-12-16 DIAGNOSIS — Z888 Allergy status to other drugs, medicaments and biological substances status: Secondary | ICD-10-CM

## 2017-12-16 DIAGNOSIS — G9341 Metabolic encephalopathy: Secondary | ICD-10-CM | POA: Diagnosis present

## 2017-12-16 DIAGNOSIS — Z9289 Personal history of other medical treatment: Secondary | ICD-10-CM

## 2017-12-16 DIAGNOSIS — Z9189 Other specified personal risk factors, not elsewhere classified: Secondary | ICD-10-CM | POA: Diagnosis not present

## 2017-12-16 DIAGNOSIS — Z800 Family history of malignant neoplasm of digestive organs: Secondary | ICD-10-CM

## 2017-12-16 DIAGNOSIS — E861 Hypovolemia: Secondary | ICD-10-CM | POA: Diagnosis present

## 2017-12-16 DIAGNOSIS — Z9981 Dependence on supplemental oxygen: Secondary | ICD-10-CM

## 2017-12-16 DIAGNOSIS — Z930 Tracheostomy status: Secondary | ICD-10-CM

## 2017-12-16 DIAGNOSIS — J440 Chronic obstructive pulmonary disease with acute lower respiratory infection: Secondary | ICD-10-CM | POA: Diagnosis present

## 2017-12-16 DIAGNOSIS — F17210 Nicotine dependence, cigarettes, uncomplicated: Secondary | ICD-10-CM | POA: Diagnosis present

## 2017-12-16 DIAGNOSIS — J810 Acute pulmonary edema: Secondary | ICD-10-CM | POA: Diagnosis not present

## 2017-12-16 DIAGNOSIS — K5903 Drug induced constipation: Secondary | ICD-10-CM | POA: Diagnosis present

## 2017-12-16 DIAGNOSIS — I9589 Other hypotension: Secondary | ICD-10-CM | POA: Diagnosis present

## 2017-12-16 DIAGNOSIS — F329 Major depressive disorder, single episode, unspecified: Secondary | ICD-10-CM | POA: Diagnosis present

## 2017-12-16 DIAGNOSIS — T80219A Unspecified infection due to central venous catheter, initial encounter: Secondary | ICD-10-CM | POA: Diagnosis not present

## 2017-12-16 DIAGNOSIS — Z881 Allergy status to other antibiotic agents status: Secondary | ICD-10-CM

## 2017-12-16 DIAGNOSIS — X58XXXA Exposure to other specified factors, initial encounter: Secondary | ICD-10-CM | POA: Diagnosis present

## 2017-12-16 DIAGNOSIS — L89300 Pressure ulcer of unspecified buttock, unstageable: Secondary | ICD-10-CM | POA: Diagnosis not present

## 2017-12-16 DIAGNOSIS — F32A Depression, unspecified: Secondary | ICD-10-CM | POA: Diagnosis present

## 2017-12-16 DIAGNOSIS — I959 Hypotension, unspecified: Secondary | ICD-10-CM | POA: Diagnosis present

## 2017-12-16 DIAGNOSIS — Z79890 Hormone replacement therapy (postmenopausal): Secondary | ICD-10-CM

## 2017-12-16 DIAGNOSIS — J690 Pneumonitis due to inhalation of food and vomit: Secondary | ICD-10-CM

## 2017-12-16 DIAGNOSIS — K668 Other specified disorders of peritoneum: Secondary | ICD-10-CM

## 2017-12-16 DIAGNOSIS — Z9181 History of falling: Secondary | ICD-10-CM

## 2017-12-16 DIAGNOSIS — R140 Abdominal distension (gaseous): Secondary | ICD-10-CM

## 2017-12-16 DIAGNOSIS — J9601 Acute respiratory failure with hypoxia: Secondary | ICD-10-CM | POA: Diagnosis not present

## 2017-12-16 DIAGNOSIS — F419 Anxiety disorder, unspecified: Secondary | ICD-10-CM | POA: Diagnosis present

## 2017-12-16 DIAGNOSIS — E878 Other disorders of electrolyte and fluid balance, not elsewhere classified: Secondary | ICD-10-CM | POA: Diagnosis not present

## 2017-12-16 DIAGNOSIS — G8929 Other chronic pain: Secondary | ICD-10-CM | POA: Diagnosis present

## 2017-12-16 DIAGNOSIS — F331 Major depressive disorder, recurrent, moderate: Secondary | ICD-10-CM | POA: Diagnosis not present

## 2017-12-16 DIAGNOSIS — E785 Hyperlipidemia, unspecified: Secondary | ICD-10-CM | POA: Diagnosis present

## 2017-12-16 DIAGNOSIS — Z8249 Family history of ischemic heart disease and other diseases of the circulatory system: Secondary | ICD-10-CM

## 2017-12-16 DIAGNOSIS — L89892 Pressure ulcer of other site, stage 2: Secondary | ICD-10-CM | POA: Diagnosis present

## 2017-12-16 DIAGNOSIS — K219 Gastro-esophageal reflux disease without esophagitis: Secondary | ICD-10-CM | POA: Diagnosis present

## 2017-12-16 DIAGNOSIS — D72825 Bandemia: Secondary | ICD-10-CM | POA: Diagnosis not present

## 2017-12-16 DIAGNOSIS — J42 Unspecified chronic bronchitis: Secondary | ICD-10-CM | POA: Diagnosis not present

## 2017-12-16 LAB — GLUCOSE, CAPILLARY
GLUCOSE-CAPILLARY: 154 mg/dL — AB (ref 70–99)
GLUCOSE-CAPILLARY: 157 mg/dL — AB (ref 70–99)
GLUCOSE-CAPILLARY: 297 mg/dL — AB (ref 70–99)
GLUCOSE-CAPILLARY: 390 mg/dL — AB (ref 70–99)
GLUCOSE-CAPILLARY: 502 mg/dL — AB (ref 70–99)
Glucose-Capillary: >600 mg/dL (ref 70–99)
Glucose-Capillary: >600 mg/dL (ref 70–99)
Glucose-Capillary: 421 mg/dL — ABNORMAL HIGH (ref 70–99)
Glucose-Capillary: 314 mg/dL — ABNORMAL HIGH (ref 70–99)
Glucose-Capillary: 257 mg/dL — ABNORMAL HIGH (ref 70–99)
Glucose-Capillary: 217 mg/dL — ABNORMAL HIGH (ref 70–99)
Glucose-Capillary: 180 mg/dL — ABNORMAL HIGH (ref 70–99)

## 2017-12-16 LAB — URINALYSIS, ROUTINE W REFLEX MICROSCOPIC
BILIRUBIN URINE: NEGATIVE
Ketones, ur: 5 mg/dL — AB
NITRITE: NEGATIVE
PH: 6 (ref 5.0–8.0)
Protein, ur: 30 mg/dL — AB
RBC / HPF: >50 RBC/hpf — ABNORMAL HIGH (ref 0–5)
SPECIFIC GRAVITY, URINE: 1.022 (ref 1.005–1.030)

## 2017-12-16 LAB — CBC WITH DIFFERENTIAL/PLATELET
ABS IMMATURE GRANULOCYTES: 0.12 10*3/uL — AB (ref 0.00–0.07)
Basophils Absolute: 0.1 10*3/uL (ref 0.0–0.1)
Basophils Relative: 0 %
EOS PCT: 0 %
Eosinophils Absolute: 0 10*3/uL (ref 0.0–0.5)
HCT: 51.3 % — ABNORMAL HIGH (ref 36.0–46.0)
Hemoglobin: 13.3 g/dL (ref 12.0–15.0)
IMMATURE GRANULOCYTES: 1 %
LYMPHS ABS: 1 10*3/uL (ref 0.7–4.0)
LYMPHS PCT: 4 %
MCH: 28.7 pg (ref 26.0–34.0)
MCHC: 25.9 g/dL — ABNORMAL LOW (ref 30.0–36.0)
MCV: 110.8 fL — ABNORMAL HIGH (ref 80.0–100.0)
MONO ABS: 0.6 10*3/uL (ref 0.1–1.0)
MONOS PCT: 3 %
NEUTROS ABS: 22 10*3/uL — AB (ref 1.7–7.7)
Neutrophils Relative %: 92 %
PLATELETS: 702 10*3/uL — AB (ref 150–400)
RBC: 4.63 MIL/uL (ref 3.87–5.11)
RDW: 14.7 % (ref 11.5–15.5)
WBC: 23.9 10*3/uL — ABNORMAL HIGH (ref 4.0–10.5)
nRBC: 0 % (ref 0.0–0.2)

## 2017-12-16 LAB — I-STAT ARTERIAL BLOOD GAS, ED
ACID-BASE DEFICIT: 4 mmol/L — AB (ref 0.0–2.0)
BICARBONATE: 26.4 mmol/L (ref 20.0–28.0)
O2 SAT: 64 %
PCO2 ART: 68.5 mmHg — AB (ref 32.0–48.0)
PO2 ART: 42 mmHg — AB (ref 83.0–108.0)
Patient temperature: 98.6
TCO2: 28 mmol/L (ref 22–32)
pH, Arterial: 7.193 — CL (ref 7.350–7.450)

## 2017-12-16 LAB — BASIC METABOLIC PANEL
ANION GAP: 15 (ref 5–15)
ANION GAP: 16 — AB (ref 5–15)
ANION GAP: 8 (ref 5–15)
Anion gap: 10 (ref 5–15)
BUN: 36 mg/dL — AB (ref 6–20)
BUN: 24 mg/dL — AB (ref 6–20)
BUN: 22 mg/dL — AB (ref 6–20)
BUN: 18 mg/dL (ref 6–20)
CALCIUM: 8.5 mg/dL — AB (ref 8.9–10.3)
CALCIUM: 8.2 mg/dL — AB (ref 8.9–10.3)
CALCIUM: 9.1 mg/dL (ref 8.9–10.3)
CALCIUM: 9 mg/dL (ref 8.9–10.3)
CO2: 19 mmol/L — ABNORMAL LOW (ref 22–32)
CO2: 16 mmol/L — ABNORMAL LOW (ref 22–32)
CO2: 23 mmol/L (ref 22–32)
CO2: 21 mmol/L — ABNORMAL LOW (ref 22–32)
Chloride: 101 mmol/L (ref 98–111)
Chloride: 107 mmol/L (ref 98–111)
Chloride: 111 mmol/L (ref 98–111)
Chloride: 110 mmol/L (ref 98–111)
Creatinine, Ser: 1.71 mg/dL — ABNORMAL HIGH (ref 0.44–1.00)
Creatinine, Ser: 1.13 mg/dL — ABNORMAL HIGH (ref 0.44–1.00)
Creatinine, Ser: 1.03 mg/dL — ABNORMAL HIGH (ref 0.44–1.00)
Creatinine, Ser: 0.79 mg/dL (ref 0.44–1.00)
GFR calc Af Amer: 41 mL/min — ABNORMAL LOW (ref >60)
GFR calc Af Amer: >60 mL/min (ref >60)
GFR calc Af Amer: >60 mL/min (ref >60)
GFR calc Af Amer: >60 mL/min (ref >60)
GFR, EST NON AFRICAN AMERICAN: 35 mL/min — AB (ref >60)
GFR, EST NON AFRICAN AMERICAN: 58 mL/min — AB (ref >60)
GLUCOSE: 843 mg/dL — AB (ref 70–99)
GLUCOSE: 408 mg/dL — AB (ref 70–99)
Glucose, Bld: 1389 mg/dL (ref 70–99)
Glucose, Bld: 164 mg/dL — ABNORMAL HIGH (ref 70–99)
POTASSIUM: 3 mmol/L — AB (ref 3.5–5.1)
POTASSIUM: 2.8 mmol/L — AB (ref 3.5–5.1)
POTASSIUM: 3.4 mmol/L — AB (ref 3.5–5.1)
POTASSIUM: 4.9 mmol/L (ref 3.5–5.1)
SODIUM: 135 mmol/L (ref 135–145)
SODIUM: 139 mmol/L (ref 135–145)
Sodium: 139 mmol/L (ref 135–145)
Sodium: 144 mmol/L (ref 135–145)

## 2017-12-16 LAB — COMPREHENSIVE METABOLIC PANEL
ALT: 24 U/L (ref 0–44)
AST: 30 U/L (ref 15–41)
Albumin: 2.7 g/dL — ABNORMAL LOW (ref 3.5–5.0)
Alkaline Phosphatase: 171 U/L — ABNORMAL HIGH (ref 38–126)
Anion gap: 19 — ABNORMAL HIGH (ref 5–15)
BUN: 41 mg/dL — ABNORMAL HIGH (ref 6–20)
CHLORIDE: 85 mmol/L — AB (ref 98–111)
CO2: 22 mmol/L (ref 22–32)
Calcium: 9.6 mg/dL (ref 8.9–10.3)
Creatinine, Ser: 1.81 mg/dL — ABNORMAL HIGH (ref 0.44–1.00)
GFR, EST AFRICAN AMERICAN: 38 mL/min — AB (ref >60)
GFR, EST NON AFRICAN AMERICAN: 33 mL/min — AB (ref >60)
Glucose, Bld: 1637 mg/dL (ref 70–99)
POTASSIUM: 3.2 mmol/L — AB (ref 3.5–5.1)
Sodium: 126 mmol/L — ABNORMAL LOW (ref 135–145)
Total Bilirubin: 1.2 mg/dL (ref 0.3–1.2)
Total Protein: 8.1 g/dL (ref 6.5–8.1)

## 2017-12-16 LAB — I-STAT CG4 LACTIC ACID, ED
LACTIC ACID, VENOUS: 3.99 mmol/L — AB (ref 0.5–1.9)
Lactic Acid, Venous: 3.51 mmol/L (ref 0.5–1.9)

## 2017-12-16 LAB — BETA-HYDROXYBUTYRIC ACID: BETA-HYDROXYBUTYRIC ACID: 2.65 mmol/L — AB (ref 0.05–0.27)

## 2017-12-16 LAB — BRAIN NATRIURETIC PEPTIDE: B Natriuretic Peptide: 280.4 pg/mL — ABNORMAL HIGH (ref 0.0–100.0)

## 2017-12-16 LAB — LACTIC ACID, PLASMA
LACTIC ACID, VENOUS: 10.3 mmol/L — AB (ref 0.5–1.9)
LACTIC ACID, VENOUS: 7.1 mmol/L — AB (ref 0.5–1.9)

## 2017-12-16 LAB — MRSA PCR SCREENING: MRSA BY PCR: NEGATIVE

## 2017-12-16 LAB — CBG MONITORING, ED: Glucose-Capillary: >600 mg/dL (ref 70–99)

## 2017-12-16 LAB — PROCALCITONIN: PROCALCITONIN: 1.26 ng/mL

## 2017-12-16 LAB — I-STAT TROPONIN, ED: Troponin i, poc: 0.01 ng/mL (ref 0.00–0.08)

## 2017-12-16 LAB — PHOSPHORUS: Phosphorus: 1.1 mg/dL — ABNORMAL LOW (ref 2.5–4.6)

## 2017-12-16 LAB — HCG, QUANTITATIVE, PREGNANCY: hCG, Beta Chain, Quant, S: <1 m[IU]/mL (ref <5)

## 2017-12-16 LAB — MAGNESIUM: Magnesium: 1.6 mg/dL — ABNORMAL LOW (ref 1.7–2.4)

## 2017-12-16 IMAGING — DX DG CHEST 1V PORT
1 series · 1 of 1 positions shown · non-contrast
Comparison: 11/09/2015

CLINICAL DATA: Pneumonia

EXAM:
PORTABLE CHEST 1 VIEW

[chest ap]
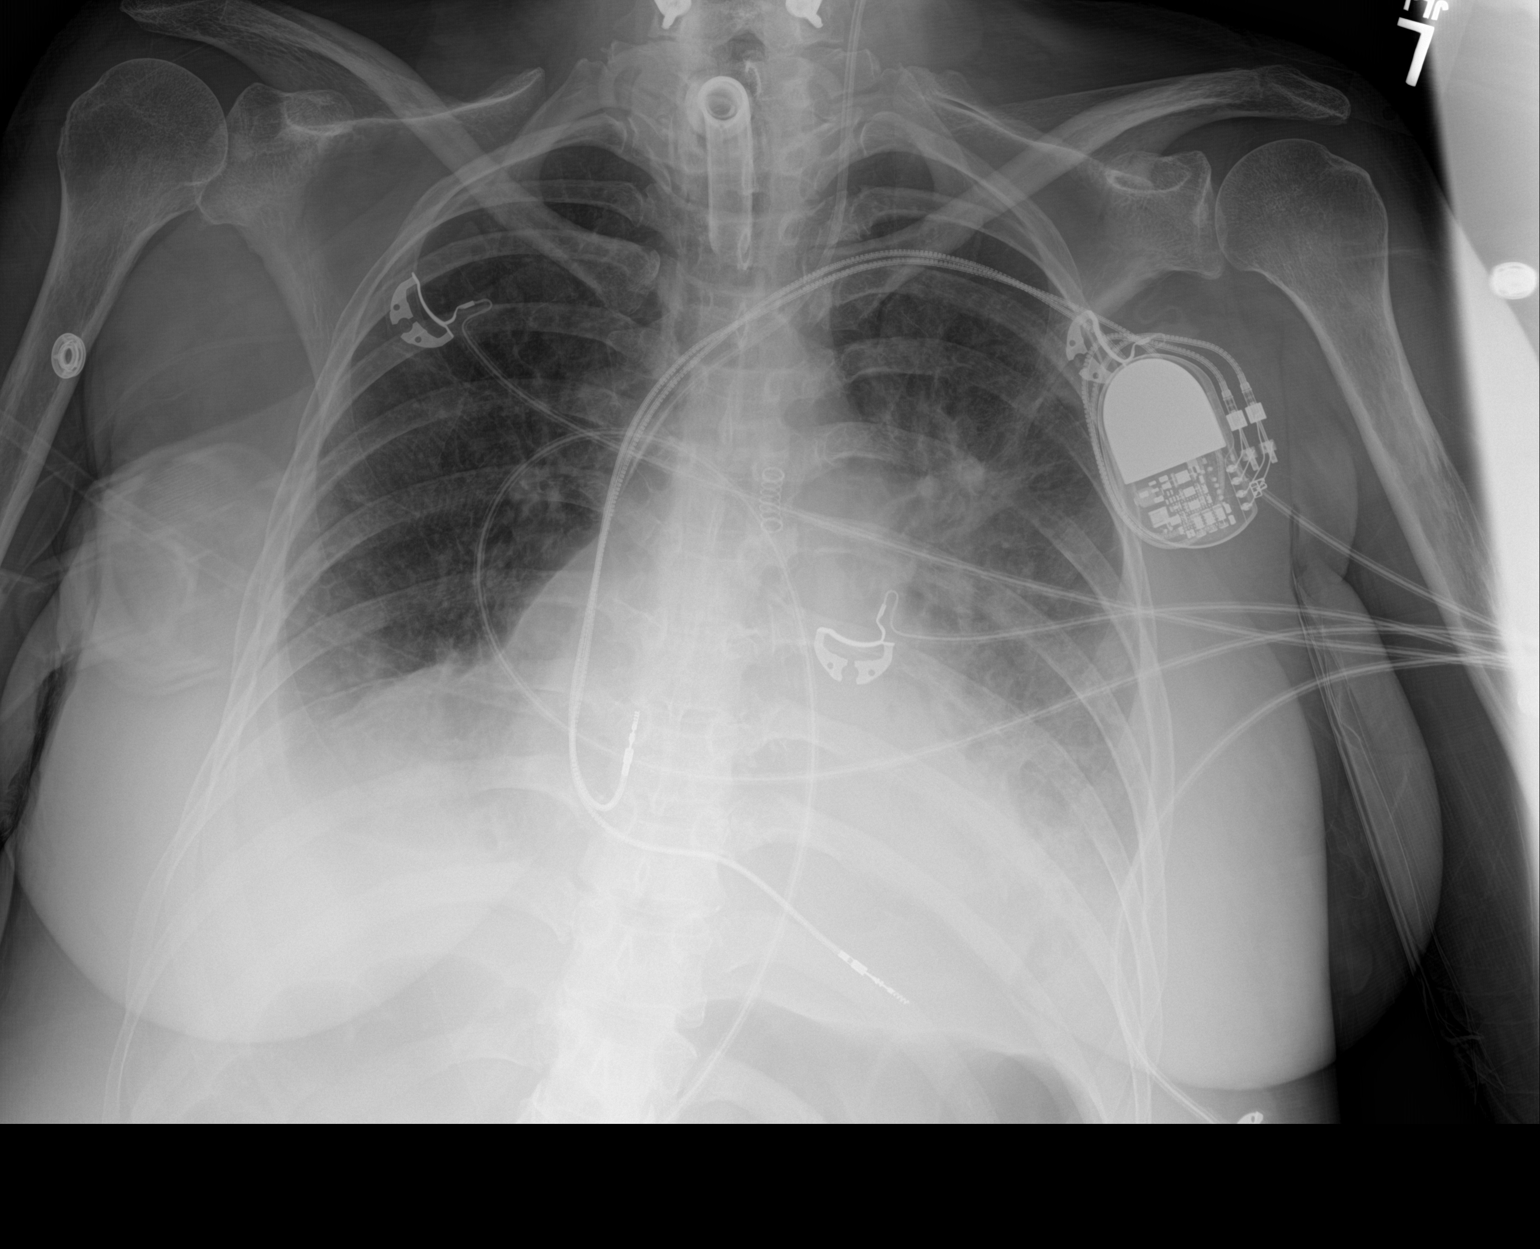

[1 of 1 positions shown; findings below may reference images not displayed]

FINDINGS: Left pacer remains in place, unchanged. Interval placement
tracheostomy tube with the tip projecting over the mid trachea. Left
central line tip in the SVC.

Bilateral lower lobe airspace opacities with layering effusions.
Aeration somewhat worsened since prior study.
IMPRESSION: Worsening bilateral lower lobe airspace opacities and small layering
effusions.

## 2017-12-16 IMAGING — DX DG ABDOMEN 1V
1 series · 1 of 1 positions shown · non-contrast
Comparison: 11/05/2015 radiographs

CLINICAL DATA: Nasogastric tube placement.

EXAM:
ABDOMEN - 1 VIEW

[abdomen kub]
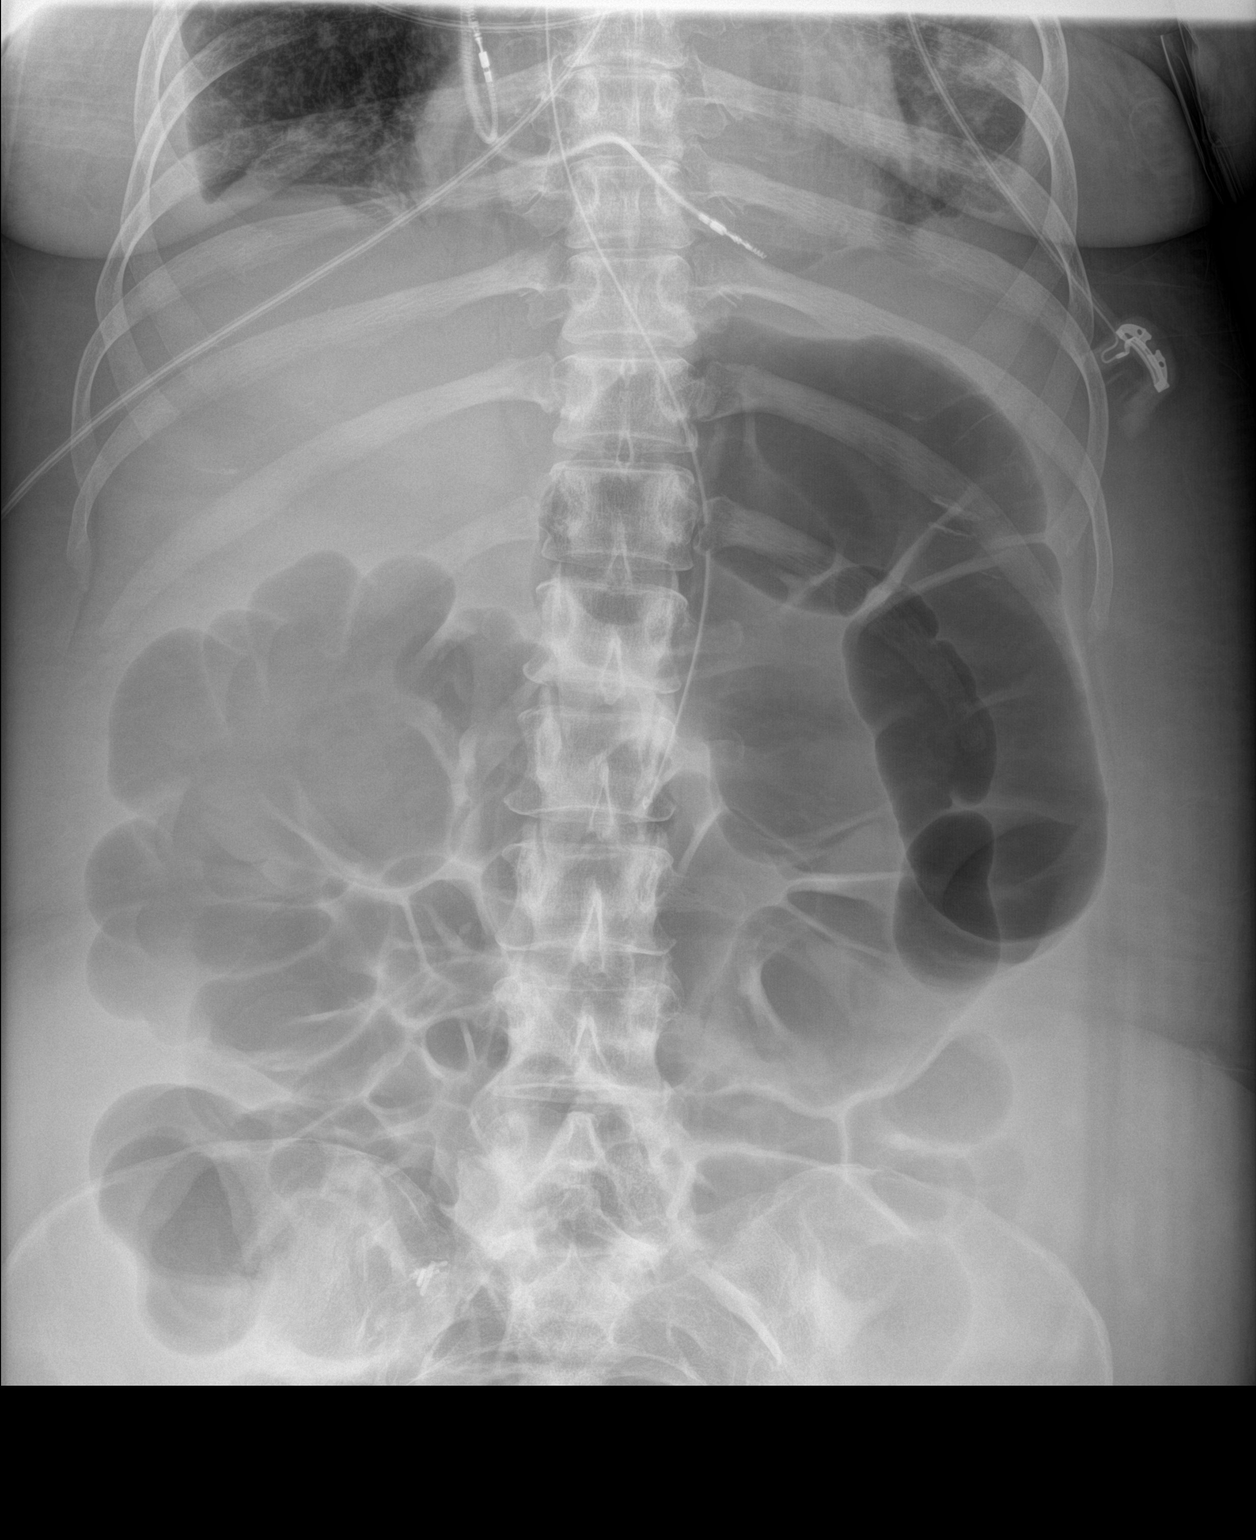

[1 of 1 positions shown; findings below may reference images not displayed]

FINDINGS: The tip of a gastric tube included side-port are seen below the
level the left hemidiaphragm in the expected location of the
stomach. Moderate gaseous distention of small and large bowel in a
nonspecific bowel gas pattern is identified. There is no
pneumoperitoneum. Right atrial and right ventricular pacing wires
are noted of the visualized heart which is mildly enlarged. Small
bilateral effusions.
IMPRESSION: Gastric tube tip and side port in the expected location of stomach.
Nonspecific bowel gas pattern with mild to moderate gaseous
distention of large and small bowel.

## 2017-12-16 IMAGING — CT CT ABD-PELV W/O CM
2 of 4 series · 16 of 46 positions shown, 18 images · non-contrast
Comparison: CT pelvis November 05, 2015

CLINICAL DATA: Abdominal distention and tenderness. History of
ileus and appendectomy, quadriparesis. Assess anatomy for
gastrostomy tube placement.

EXAM:
CT ABDOMEN AND PELVIS WITHOUT CONTRAST
TECHNIQUE: Multidetector CT imaging of the abdomen and pelvis was performed
following the standard protocol without IV contrast.

[Series 3: axial st · axial · 0.76mm/px · z∈[-312,+93]mm · 13 of 89 slices shown, 15 images]
[im 4/89  soft-tissue]
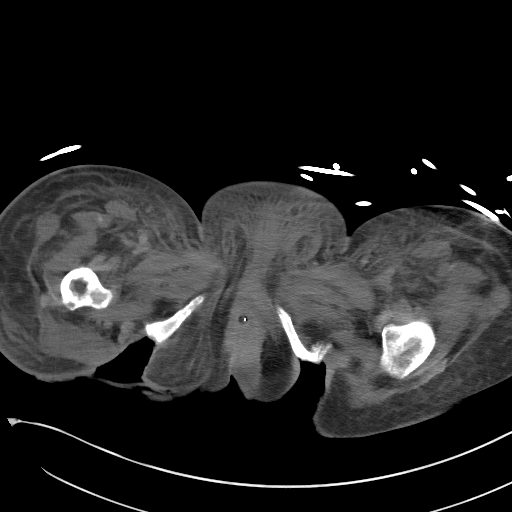
[im 4/89  bone]
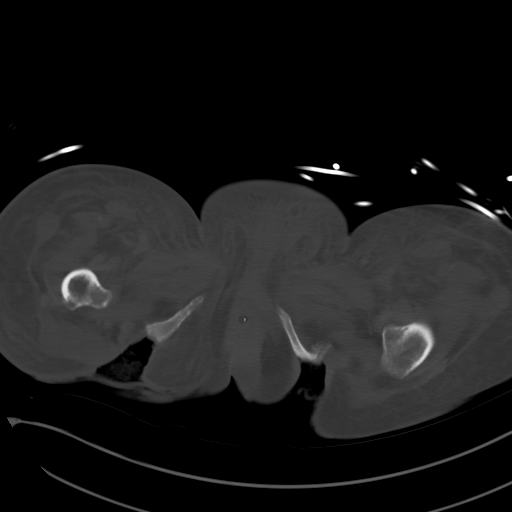
[im 11/89  soft-tissue]
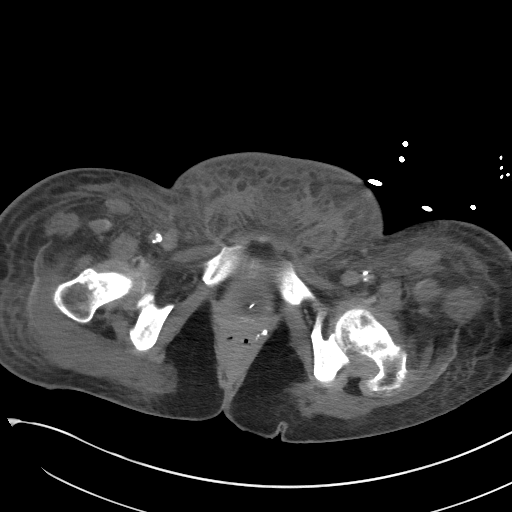
[im 17/89  soft-tissue]
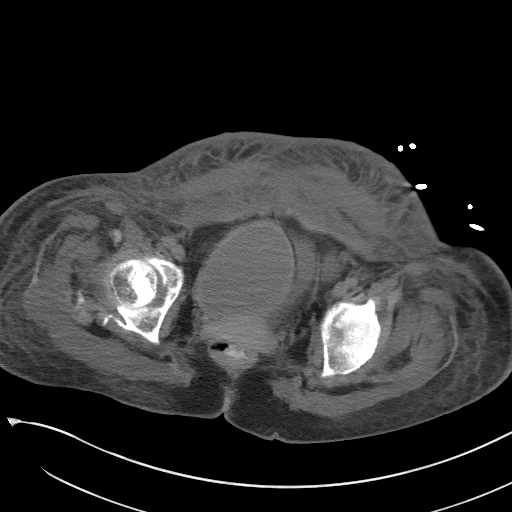
[im 24/89  soft-tissue]
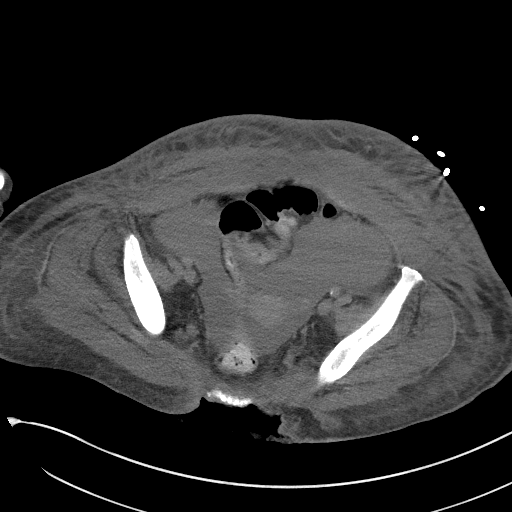
[im 31/89  soft-tissue]
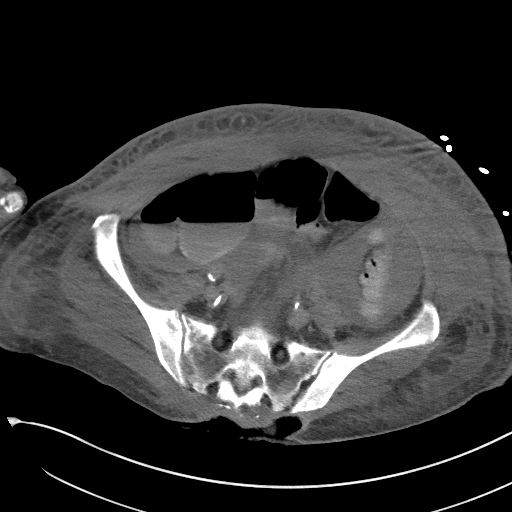
[im 38/89  soft-tissue]
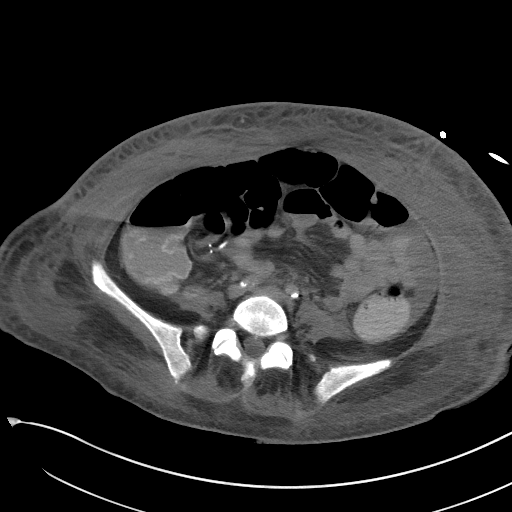
[im 45/89  soft-tissue]
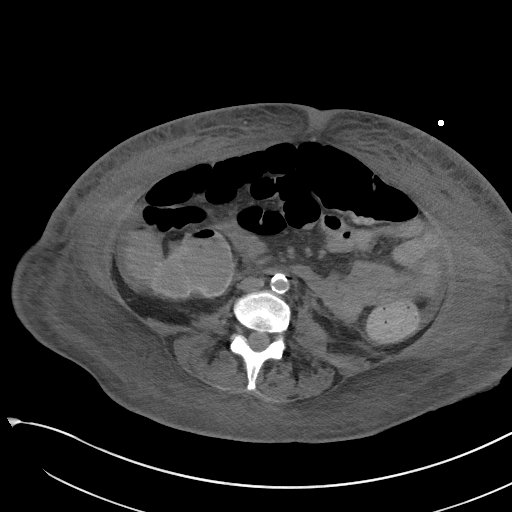
[im 51/89  soft-tissue]
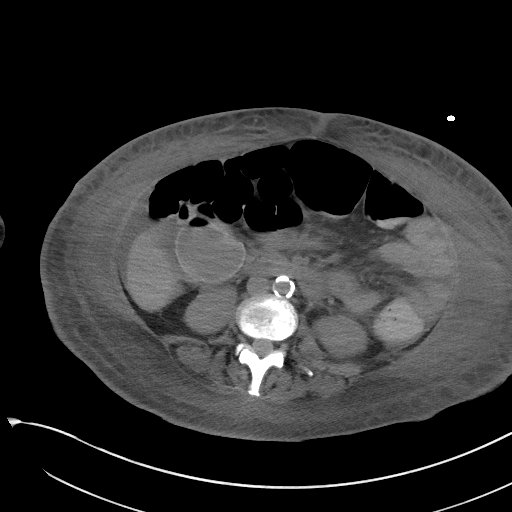
[im 58/89  soft-tissue]
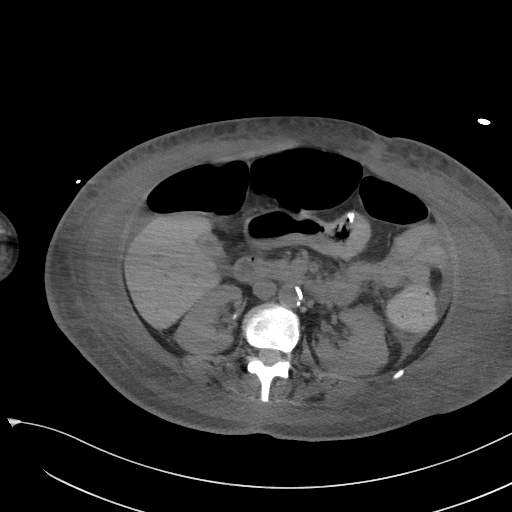
[im 58/89  bone]
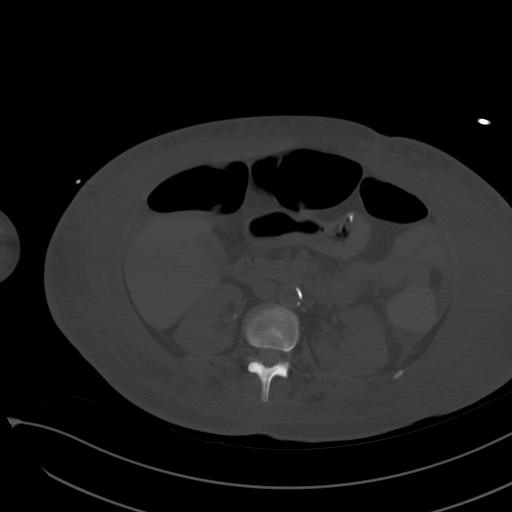
[im 65/89  soft-tissue]
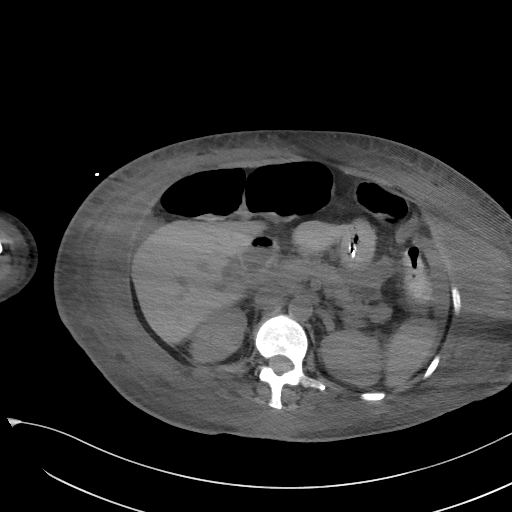
[im 72/89  soft-tissue]
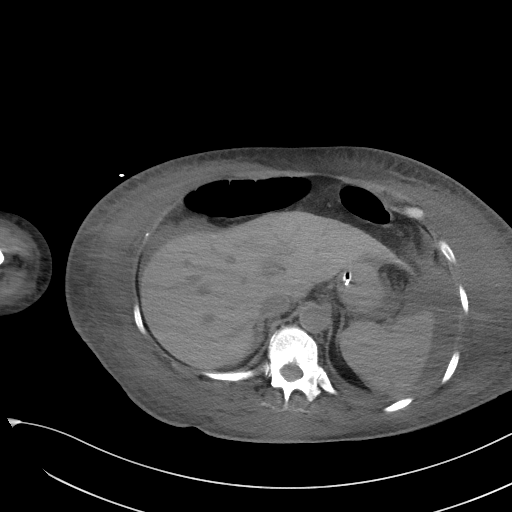
[im 78/89  soft-tissue]
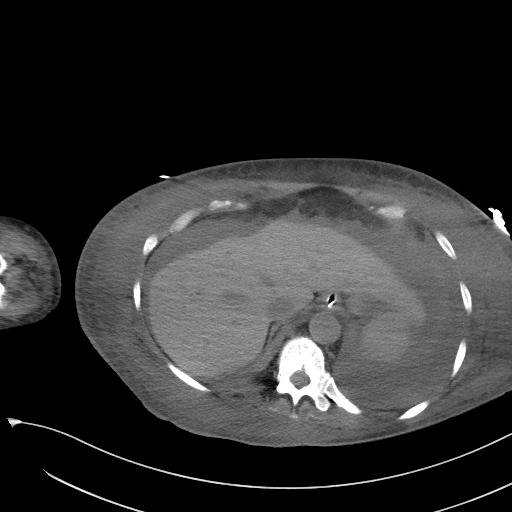
[im 85/89  soft-tissue]
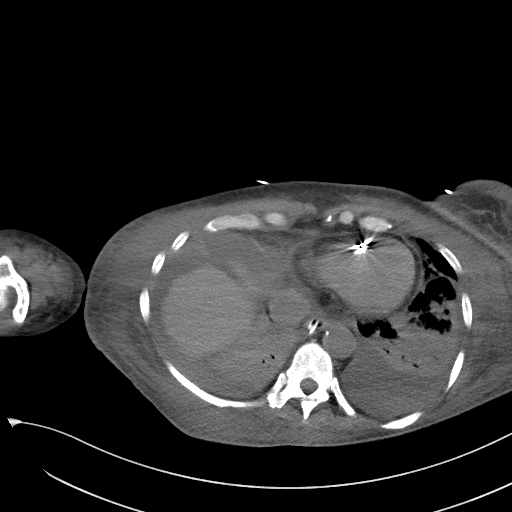

[Series 6: coronal st · coronal · 0.93mm/px · 3 of 95 slices shown]
[im 32/95  soft-tissue]
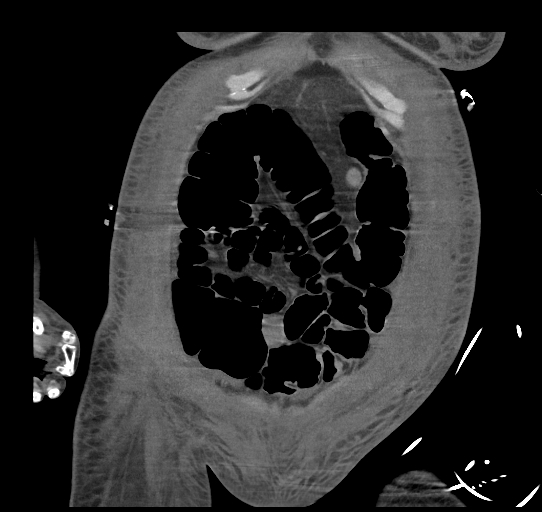
[im 42/95  soft-tissue]
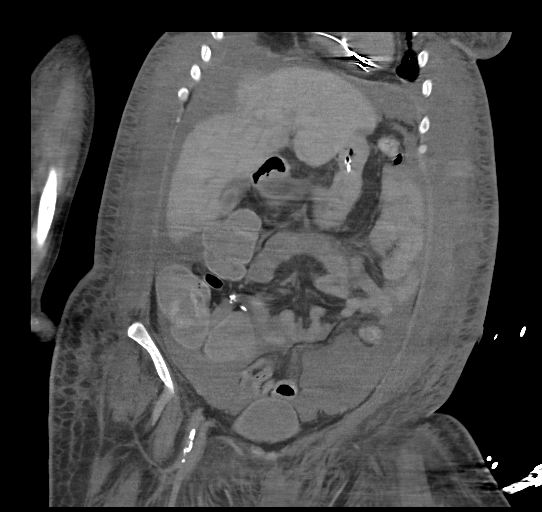
[im 53/95  soft-tissue]
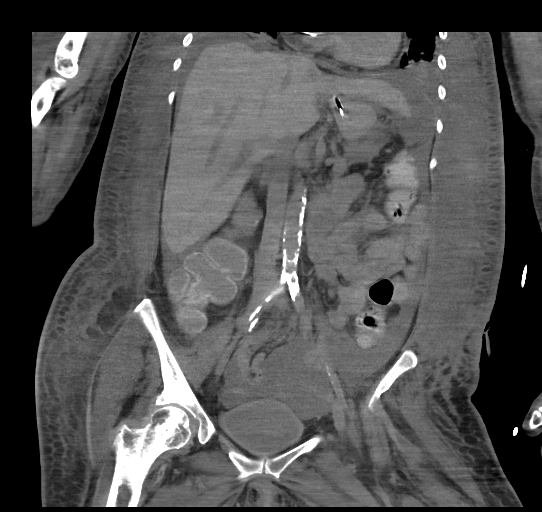

[16 of 46 positions shown; findings below may reference images not displayed]

FINDINGS: LOWER CHEST: Dense consolidation in the included lower lobes
bilaterally. Bilateral pleural effusions. Heart size is normal. No
pericardial effusion. Cardiac pacer wires in place.

HEPATOBILIARY: Normal.

PANCREAS: Normal.

SPLEEN: Normal.

ADRENALS/URINARY TRACT: Kidneys are orthotopic, demonstrating normal
size and morphology. 6 mm calculus in RIGHT renal pelvis, 6 mm
calculus in proximal RIGHT ureter. 3 mm and 2 mm LEFT lower pole
nephrolithiasis. No hydronephrosis. Limited assessment for renal
masses on this nonenhanced examination. The unopacified ureters are
normal in course and caliber. Urinary bladder is partially distended
containing a Foley catheter in bulb. Multiple small dependent
bladder calculi. Normal adrenal glands.

STOMACH/BOWEL: Mild gas and contrast distended colon to 5.7 cm.
Decompressed small bowel. Gastrostomy tube terminates in mid
stomach. Tenting of the greater car bridge the stomach toward the
abdominal wall most compatible with old gastrostomy tube.

VASCULAR/LYMPHATIC: Aortoiliac vessels are normal in course and
caliber, severe calcific atherosclerosis.

REPRODUCTIVE: Normal.

OTHER: Moderate amount of low-density ascites. No intraperitoneal
free air.

MUSCULOSKELETAL: Anasarca. LEFT anterior abdominal wall scarring.
Bilateral sacral in ischial tuberosity ulcers with packing material.
Severe osteopenia. Sclerotic cough 6 with bony reabsorption.
IMPRESSION: Gas and contrast distended large bowel compatible with ileus. No
bowel obstruction. Tenting of the stomach to the anterior abdominal
wall at site of prior gastrostomy.

Worsening anasarca, increasing moderate ascites.

Severe atherosclerosis.

Sacral/ischial ulcers with CT findings of chronic coccyx
osteomyelitis.

## 2017-12-16 MED ORDER — ORAL CARE MOUTH RINSE
15.0000 mL | OROMUCOSAL | Status: DC
  Administered 2017-12-16 – 2017-12-31 (×150): 15 mL via OROMUCOSAL

## 2017-12-16 MED ORDER — LEVALBUTEROL HCL 1.25 MG/0.5ML IN NEBU
1.2500 mg | INHALATION_SOLUTION | Freq: Four times a day (QID) | RESPIRATORY_TRACT | Status: DC
  Administered 2017-12-16 – 2017-12-29 (×53): 1.25 mg via RESPIRATORY_TRACT
  Filled 2017-12-16 (×54): qty 0.5

## 2017-12-16 MED ORDER — LACTATED RINGERS IV BOLUS
2000.0000 mL | Freq: Once | INTRAVENOUS | Status: AC
  Administered 2017-12-16: 2000 mL via INTRAVENOUS

## 2017-12-16 MED ORDER — FAMOTIDINE IN NACL 20-0.9 MG/50ML-% IV SOLN
20.0000 mg | Freq: Two times a day (BID) | INTRAVENOUS | Status: DC
  Administered 2017-12-16 – 2017-12-18 (×6): 20 mg via INTRAVENOUS
  Filled 2017-12-16 (×7): qty 50

## 2017-12-16 MED ORDER — ONDANSETRON HCL 4 MG/2ML IJ SOLN
4.0000 mg | Freq: Three times a day (TID) | INTRAMUSCULAR | Status: DC | PRN
  Administered 2017-12-17 – 2017-12-19 (×3): 4 mg via INTRAVENOUS
  Filled 2017-12-16 (×3): qty 2

## 2017-12-16 MED ORDER — INSULIN REGULAR BOLUS VIA INFUSION
0.0000 [IU] | Freq: Three times a day (TID) | INTRAVENOUS | Status: DC
  Filled 2017-12-16: qty 10

## 2017-12-16 MED ORDER — SODIUM CHLORIDE 0.9 % IV SOLN
INTRAVENOUS | Status: DC
  Administered 2017-12-16: 05:00:00 via INTRAVENOUS

## 2017-12-16 MED ORDER — LACTATED RINGERS IV BOLUS
1000.0000 mL | Freq: Once | INTRAVENOUS | Status: AC
  Administered 2017-12-16: 1000 mL via INTRAVENOUS

## 2017-12-16 MED ORDER — IPRATROPIUM-ALBUTEROL 0.5-2.5 (3) MG/3ML IN SOLN: 3.0000 mL | RESPIRATORY_TRACT | Status: DC

## 2017-12-16 MED ORDER — ACETAMINOPHEN 650 MG RE SUPP
650.0000 mg | Freq: Four times a day (QID) | RECTAL | Status: DC | PRN
  Administered 2017-12-16 – 2017-12-18 (×2): 650 mg via RECTAL
  Filled 2017-12-16 (×3): qty 1

## 2017-12-16 MED ORDER — BUDESONIDE 0.5 MG/2ML IN SUSP
0.5000 mg | Freq: Two times a day (BID) | RESPIRATORY_TRACT | Status: DC
  Administered 2017-12-16 – 2017-12-31 (×30): 0.5 mg via RESPIRATORY_TRACT
  Filled 2017-12-16 (×32): qty 2

## 2017-12-16 MED ORDER — IPRATROPIUM BROMIDE 0.02 % IN SOLN
0.5000 mg | RESPIRATORY_TRACT | Status: DC
  Administered 2017-12-16: 0.5 mg via RESPIRATORY_TRACT
  Filled 2017-12-16: qty 2.5

## 2017-12-16 MED ORDER — DEXTROSE-NACL 5-0.45 % IV SOLN: INTRAVENOUS | Status: DC

## 2017-12-16 MED ORDER — MIDODRINE HCL 5 MG PO TABS
10.0000 mg | ORAL_TABLET | Freq: Three times a day (TID) | ORAL | Status: DC
  Administered 2017-12-18 – 2017-12-25 (×22): 10 mg via ORAL
  Filled 2017-12-16 (×29): qty 2

## 2017-12-16 MED ORDER — ARFORMOTEROL TARTRATE 15 MCG/2ML IN NEBU
15.0000 ug | INHALATION_SOLUTION | Freq: Two times a day (BID) | RESPIRATORY_TRACT | Status: DC
  Administered 2017-12-16 – 2017-12-31 (×30): 15 ug via RESPIRATORY_TRACT
  Filled 2017-12-16 (×32): qty 2

## 2017-12-16 MED ORDER — CHLORHEXIDINE GLUCONATE 0.12% ORAL RINSE (MEDLINE KIT)
15.0000 mL | Freq: Two times a day (BID) | OROMUCOSAL | Status: DC
  Administered 2017-12-16 – 2017-12-31 (×31): 15 mL via OROMUCOSAL

## 2017-12-16 MED ORDER — POTASSIUM CHLORIDE 10 MEQ/100ML IV SOLN
10.0000 meq | INTRAVENOUS | Status: AC
  Administered 2017-12-16 (×4): 10 meq via INTRAVENOUS
  Filled 2017-12-16 (×4): qty 100

## 2017-12-16 MED ORDER — IPRATROPIUM BROMIDE 0.02 % IN SOLN
0.5000 mg | Freq: Four times a day (QID) | RESPIRATORY_TRACT | Status: DC
  Administered 2017-12-16 – 2017-12-31 (×61): 0.5 mg via RESPIRATORY_TRACT
  Filled 2017-12-16 (×63): qty 2.5

## 2017-12-16 MED ORDER — SODIUM CHLORIDE 0.9 % IV SOLN: INTRAVENOUS | Status: DC

## 2017-12-16 MED ORDER — LACTATED RINGERS IV SOLN
INTRAVENOUS | Status: DC
  Administered 2017-12-16: 11:00:00 via INTRAVENOUS

## 2017-12-16 MED ORDER — GERHARDT'S BUTT CREAM
TOPICAL_CREAM | Freq: Four times a day (QID) | CUTANEOUS | Status: DC
  Administered 2017-12-16 – 2017-12-18 (×12): via TOPICAL
  Administered 2017-12-19: 1 via TOPICAL
  Administered 2017-12-19 – 2017-12-24 (×20): via TOPICAL
  Administered 2017-12-24: 1 via TOPICAL
  Administered 2017-12-24 – 2017-12-25 (×5): via TOPICAL
  Administered 2017-12-25: 1 via TOPICAL
  Administered 2017-12-26 – 2017-12-28 (×9): via TOPICAL
  Administered 2017-12-28: 1 via TOPICAL
  Administered 2017-12-28 (×2): via TOPICAL
  Administered 2017-12-29: 1 via TOPICAL
  Administered 2017-12-29 (×2): via TOPICAL
  Administered 2017-12-29: 1 via TOPICAL
  Administered 2017-12-30 – 2017-12-31 (×6): via TOPICAL
  Filled 2017-12-16 (×2): qty 1

## 2017-12-16 MED ORDER — INSULIN REGULAR(HUMAN) IN NACL 100-0.9 UT/100ML-% IV SOLN
INTRAVENOUS | Status: DC
  Administered 2017-12-16: 10.8 [IU]/h via INTRAVENOUS
  Administered 2017-12-16: 5.4 [IU]/h via INTRAVENOUS
  Filled 2017-12-16 (×2): qty 100

## 2017-12-16 MED ORDER — MAGNESIUM SULFATE 2 GM/50ML IV SOLN
2.0000 g | Freq: Once | INTRAVENOUS | Status: AC
  Administered 2017-12-16: 2 g via INTRAVENOUS
  Filled 2017-12-16: qty 50

## 2017-12-16 MED ORDER — DEXTROSE-NACL 5-0.45 % IV SOLN
INTRAVENOUS | Status: DC
  Administered 2017-12-16: 17:00:00 via INTRAVENOUS

## 2017-12-16 MED ORDER — POTASSIUM CHLORIDE 10 MEQ/100ML IV SOLN
10.0000 meq | INTRAVENOUS | Status: AC
  Administered 2017-12-16 (×3): 10 meq via INTRAVENOUS
  Filled 2017-12-16 (×3): qty 100

## 2017-12-16 MED ORDER — IPRATROPIUM-ALBUTEROL 0.5-2.5 (3) MG/3ML IN SOLN
3.0000 mL | Freq: Once | RESPIRATORY_TRACT | Status: AC
  Administered 2017-12-16: 3 mL via RESPIRATORY_TRACT
  Filled 2017-12-16: qty 3

## 2017-12-16 MED ORDER — SODIUM CHLORIDE 0.9 % IV SOLN
INTRAVENOUS | Status: DC
  Administered 2017-12-16: 06:00:00 via INTRAVENOUS

## 2017-12-16 MED ORDER — DEXTROSE 50 % IV SOLN
25.0000 mL | INTRAVENOUS | Status: DC | PRN
  Administered 2017-12-18: 25 mL via INTRAVENOUS
  Filled 2017-12-16 (×2): qty 50

## 2017-12-16 MED ORDER — POTASSIUM CHLORIDE 10 MEQ/100ML IV SOLN
10.0000 meq | Freq: Once | INTRAVENOUS | Status: AC
  Administered 2017-12-16: 10 meq via INTRAVENOUS
  Filled 2017-12-16: qty 100

## 2017-12-16 MED ORDER — METHYLPREDNISOLONE SODIUM SUCC 125 MG IJ SOLR
125.0000 mg | Freq: Once | INTRAMUSCULAR | Status: AC
  Administered 2017-12-16: 125 mg via INTRAVENOUS
  Filled 2017-12-16: qty 2

## 2017-12-16 MED ORDER — HEPARIN SODIUM (PORCINE) 5000 UNIT/ML IJ SOLN
5000.0000 [IU] | Freq: Three times a day (TID) | INTRAMUSCULAR | Status: DC
  Administered 2017-12-16 – 2017-12-31 (×47): 5000 [IU] via SUBCUTANEOUS
  Filled 2017-12-16 (×47): qty 1

## 2017-12-16 MED ORDER — SODIUM CHLORIDE 0.9 % IV SOLN
1.0000 g | INTRAVENOUS | Status: DC
  Administered 2017-12-16 – 2017-12-18 (×3): 1 g via INTRAVENOUS
  Filled 2017-12-16 (×3): qty 10

## 2017-12-16 MED ORDER — SODIUM CHLORIDE 0.9 % IV BOLUS
2000.0000 mL | Freq: Once | INTRAVENOUS | Status: AC
  Administered 2017-12-16: 2000 mL via INTRAVENOUS

## 2017-12-16 MED ORDER — INSULIN GLARGINE 100 UNIT/ML ~~LOC~~ SOLN
10.0000 [IU] | Freq: Once | SUBCUTANEOUS | Status: AC
  Administered 2017-12-16: 10 [IU] via SUBCUTANEOUS
  Filled 2017-12-16: qty 0.1

## 2017-12-16 NOTE — Consult Note (Signed)
[place] Nurse wound consult note Patient receiving care in [facility] 2M13.  No family present. Reason for Consult: New admission with areas to eval Wound type: Entire perineal area including one spot on the coccyx has a severe case of MASD.  The patient is leaking fluid from an unknown source; it does NOT appear to be from the Suprapubic cath as that dressing is completely dry.  The liquid leakage that is clear is creating a constant state of moisture and it's associated complications.  For this area I have order every 4 hour application of Gerhardt's butt cream.  An ordinary split gauze dressing around the suprapubic cath should be fine. Monitor the wound area(s) for worsening of condition such as: Signs/symptoms of infection,  Increase in size,  Development of or worsening of odor, Development of pain, or increased pain at the affected locations.  Notify the medical team if any of these develop.  Thank you for the consult.  Discussed plan of care with the patient and bedside nurse.  [place] nurse will not follow at this time.  Please re-consult the [place] team if needed.  Val Riles, RN, MSN, CWOCN, CNS-BC, pager [phone]

## 2017-12-16 NOTE — ED Triage Notes (Signed)
Patient arrived with EMS from home with CBG=High by EMS , unresponsive this evening with wheezing /rales on both lung fields , she received Albuterol 5 mg nebulizer by EMS . Patient has a tracheostomy /suprapubic catheter and a G- tube .

## 2017-12-16 NOTE — H&P (Signed)
History and Physical    Paloma Grange TML:[id number] DOB: February 25, 1970 DOA: 12/16/2017  Referring MD/NP/PA:   PCP: Briscoe Deutscher, DO   Patient coming from:  The patient is coming from home.  At baseline, pt is independent for most of ADL.        Chief Complaint: Unresponsiveness, elevated blood sugar, shortness of breath, nausea vomiting  HPI: Angela Page is a 47 y.o. female with medical history significant of quadriplegia due to cervical spine injury, tracheostomy, suprapubic catheter placement, PEG tube placement, hyperlipidemia, COPD, asthma, GERD, depression, pacemaker placement, polysubstance dependency, cocaine abuse, tobacco abuse, dCHF, who presents with unresponsiveness, elevated blood sugar, shortness of breath,  nausea, vomiting.  Per care giver, pt has nausea and vomited 7-8 times today, mostly water vomitus. She has chronic loose stool bowel movement due to lactulose use. Patient does not seem to have abdominal pain.  Patient has cough, SOB and wheezing.  Does not seem to have chest pain.  No fever.  Caregiver does not think patient has UTI since patient just had negative urine culture yesterday.  Patient became unresponsive in evening.  Her blood sugar is elevated.  Patient does not have history of diabetes.  Patient was initially hypotensive with blood pressure 78/63, which improved to 98/68 after giving 2 L normal saline bolus in ED.  /ED Course: pt was found to have blood sugar 1657, anion gap 19, bicarbonate of 22, AKI, potassium 3.2, WBC 23.9, lactic acid 3.99, negative troponin, BNP 280, tachycardia, tachypnea, oxygen saturation 79-91% on RA, temperature normal.  ABG with pH 7.193, PCO2 68.5, PO2 42.  Chest x-ray negative.  Patient is admitted to stepdown bed as inpatient.  PCCM, Dr. Oletta Darter was consulted.  Review of Systems: Could not be reviewed since patient is in coma.  Allergy:  Allergies  Allergen Reactions  . Erythromycin Nausea And Vomiting  .  Nitrofurantoin Monohyd Macro Nausea And Vomiting  . Oxybutynin     Dries mouth out   . Penicillins Hives    Has patient had a PCN reaction causing immediate rash, facial/tongue/throat swelling, SOB or lightheadedness with hypotension: Yes Has patient had a PCN reaction causing severe rash involving mucus membranes or skin necrosis: Yes Has patient had a PCN reaction that required hospitalization No Has patient had a PCN reaction occurring within the last 10 years: No-childhood allergy If all of the above answers are "NO", then may proceed with Cephalosporin     Past Medical History:  Diagnosis Date  . Anasarca 06/10/2016  . Asthma   . Cocaine use 10/08/2013  . Depression   . GERD (gastroesophageal reflux disease)   . HCAP (healthcare-associated pneumonia) 06/09/2016  . Hepatitis    hx of hepatits frm mono   . Kidney stone   . Overdose of opiate or related narcotic ([place]) 09/02/2014  . Pacemaker   . Pleurisy   . Polysubstance dependence including opioid type drug, episodic abuse ([place]) 10/27/2013  . Protein calorie malnutrition ([place]) 10/31/2015  . Quadriparesis ([place])   . Quadriplegia, C5-C7 incomplete ([place]) 10/08/2013  . Stage IV pressure ulcer of sacral region ([place]) 10/31/2015    Past Surgical History:  Procedure Laterality Date  . APPENDECTOMY    . BACK SURGERY  10/08/2013   fusion of spine, cervical region  . CARDIAC SURGERY    . CYSTOSCOPY W/ URETERAL STENT PLACEMENT Right 03/09/2017   Procedure: CYSTOSCOPY [name] STENT PLACEMENT;  Surgeon: Irine Seal, MD;  Location: [facility];  Service: Urology;  Laterality: Right;  .  CYSTOSCOPY W/ URETERAL STENT REMOVAL Right 07/16/2017   Procedure: CYSTOSCOPY WITH STENT REMOVAL;  Surgeon: Franchot Gallo, MD;  Location: [facility];  Service: Urology;  Laterality: Right;  . CYSTOSCOPY/URETEROSCOPY/HOLMIUM LASER/STENT PLACEMENT Right 07/16/2017   Procedure: CYSTOSCOPY/URETEROSCOPY/HOLMIUM LASER/STENT PLACEMENT/ALSO STONE EXTRACTION;  Surgeon:  Franchot Gallo, MD;  Location: [facility];  Service: Urology;  Laterality: Right;  . GASTROSTOMY  11/23/2015  . I&D EXTREMITY  10/28/2011   Procedure: IRRIGATION AND DEBRIDEMENT EXTREMITY;  Surgeon: Tennis Must, MD;  Location: [place];  Service: Orthopedics;  Laterality: Right;  Irrigation and debridement right middle finger  . INSERTION OF SUPRAPUBIC CATHETER N/A 07/28/2016   Procedure: INSERTION OF SUPRAPUBIC CATHETER;  Surgeon: Franchot Gallo, MD;  Location: [facility];  Service: Urology;  Laterality: N/A;  . IR GENERIC HISTORICAL  11/23/2015   IR GASTROSTOMY TUBE MOD SED 11/23/2015 [facility]  . LITHOTRIPSY  08/2017  . TRACHEOSTOMY      Social History:  reports that she has been smoking cigarettes. She has a 25.00 pack-year smoking history. She has never used smokeless tobacco. She reports that she does not drink alcohol or use drugs.  Family History:  Family History  Problem Relation Age of Onset  . Hypertension Maternal Uncle   . Kidney failure Maternal Uncle   . Colon cancer Mother   . Lung cancer Father   . Hypertension Brother   . Kidney failure Maternal Aunt   . Kidney failure Maternal Uncle   . Colon cancer Maternal Grandfather   . Esophageal cancer Neg Hx      Prior to Admission medications   Medication Sig Start Date End Date Taking? Authorizing Provider  acetaminophen (TYLENOL) 500 MG tablet Take 1,000 mg by mouth every 6 (six) hours as needed for mild pain, moderate pain, fever or headache.     [provider]  albuterol (PROVENTIL HFA;VENTOLIN HFA) 108 (90 Base) MCG/ACT inhaler INHALE 1-2 PUFFS INTO THE LUNGS EVERY 6 (SIX) HOURS AS NEEDED FOR WHEEZING OR SHORTNESS OF BREATH. 05/25/17   Briscoe Deutscher, DO  albuterol (PROVENTIL) (2.5 MG/3ML) 0.083% nebulizer solution Take 2.5 mg by nebulization every 6 (six) hours as needed for wheezing or shortness of breath.    [provider]  alum & mag hydroxide-simeth (MAALOX PLUS) 400-400-40  MG/5ML suspension Take 10-20 mLs by mouth 4 (four) times daily as needed for indigestion.    [provider]  baclofen (LIORESAL) 20 MG tablet TAKE 1 TABLET BY MOUTH 3 TIMES A DAY 06/30/17   Briscoe Deutscher, DO  carboxymethylcellul-glycerin (REFRESH OPTIVE) 0.5-0.9 % ophthalmic solution 1 drop.    [provider]  [facility] MELATONIN 5 MG TABS TAKE 1 TABLET BY MOUTH EVERYDAY AT BEDTIME 11/09/17   Briscoe Deutscher, DO  DULoxetine (CYMBALTA) 60 MG capsule Take 1 capsule (60 mg total) by mouth 2 (two) times daily. 09/29/17   Kathrynn Ducking, MD  fenofibrate (TRICOR) 145 MG tablet Take 145 mg by mouth daily. 09/25/17   [provider]  furosemide (LASIX) 40 MG tablet Take 1 tablet (40 mg total) by mouth daily. 04/28/17   Briscoe Deutscher, DO  gabapentin (NEURONTIN) 600 MG tablet TAKE 1 TABLET (600 MG TOTAL) BY MOUTH 4 (FOUR) TIMES DAILY. 11/16/17   Kathrynn Ducking, MD  ibuprofen (ADVIL,MOTRIN) 200 MG tablet Place 2 tablets (400 mg total) into feeding tube every 6 (six) hours as needed for moderate pain. 11/26/15   Florencia Reasons, MD  KLOR-CON M20 20 MEQ tablet TAKE 1 TABLET BY MOUTH  EVERY DAY 10/19/17   Briscoe Deutscher, DO  Lactulose 20 GM/30ML SOLN Take 30 mLs (20 g total) by mouth 2 (two) times daily. 12/09/17 01/08/18  Jackquline Denmark, MD  linaclotide Rolan Lipa) 72 MCG capsule Take 1 capsule (72 mcg total) by mouth daily before breakfast. 07/14/17   Jackquline Denmark, MD  magnesium citrate SOLN Take 1 Bottle by mouth daily as needed for severe constipation.     [provider]  magnesium hydroxide (MILK OF MAGNESIA) 400 MG/5ML suspension Take 5 mLs by mouth daily as needed for mild constipation. 11/10/16   Briscoe Deutscher, DO  midodrine (PROAMATINE) 10 MG tablet PLACE 1 TABLET (10 MG TOTAL) INTO FEEDING TUBE 3 (THREE) TIMES DAILY WITH MEALS. 11/30/17   Briscoe Deutscher, DO  Nutritional Supplements (FEEDING SUPPLEMENT, OSMOLITE 1.2 CAL,) LIQD Place 1,000 mLs into feeding tube continuous. Patient  taking differently: Place 1,000 mLs into feeding tube continuous. 100 mLs Per Hour Over a 10 Hour Period=1,000 mLs. Feeding Given In The Evening From 2300 to 0900. 01/28/17   Briscoe Deutscher, DO  Nutritional Supplements (PROMOD) LIQD Give 30 mLs by tube 2 (two) times daily.     [provider]  nystatin (MYCOSTATIN) 100000 UNIT/ML suspension Take 5 mLs (500,000 Units total) by mouth 4 (four) times daily. Patient taking differently: Take 500,000 Units by mouth 4 (four) times daily as needed (tongue swelling).  12/09/16   Nat Christen, MD  omeprazole (PRILOSEC) 20 MG capsule Take 1 capsule (20 mg total) by mouth daily. 07/14/17   Jackquline Denmark, MD  ondansetron (ZOFRAN) 4 MG tablet TAKE 1 TABLET BY MOUTH EVERY 8 HOURS AS NEEDED FOR NAUSEA AND VOMITING 08/12/17   Briscoe Deutscher, DO  Oxycodone HCl 10 MG TABS Take 10 mg by mouth 4 (four) times daily as needed (for pain).     [provider]  OXYGEN Inhale 3 L into the lungs continuous.    [provider]  oxymetazoline (AFRIN) 0.05 % nasal spray Place 2 sprays into both nostrils 3 (three) times daily as needed for congestion.    [provider]  polyethylene glycol powder (GLYCOLAX/MIRALAX) powder Take 17 g by mouth daily. Patient taking differently: Take 17 g by mouth as needed.  07/14/17   Jackquline Denmark, MD  Prucalopride Succinate (MOTEGRITY) 2 MG TABS Take 2 mg by mouth daily. 11/18/17   Jackquline Denmark, MD  Prucalopride Succinate (MOTEGRITY) 2 MG TABS Take 2 mg by mouth daily. 11/18/17   Jackquline Denmark, MD  Skin Protectants, Misc. [facility] SKIN PROTECTANT EX) Apply 1 application topically 3 (three) times daily as needed (redness/irritation).     [provider]  tiotropium (SPIRIVA HANDIHALER) 18 MCG inhalation capsule Place 1 capsule (18 mcg total) into inhaler and inhale daily. 04/03/17   Erick Colace, NP  tiZANidine (ZANAFLEX) 2 MG tablet TAKE 1 TABLET (2 MG TOTAL) BY MOUTH 3 (THREE) TIMES DAILY. 09/22/17   Kathrynn Ducking, MD  Water For Irrigation, Sterile (FREE WATER) SOLN Place 100 mLs into feeding tube every 8 (eight) hours. 11/26/15   Florencia Reasons, MD  zolpidem (AMBIEN) 10 MG tablet Take 10 mg by mouth at bedtime.    [provider]    Physical Exam: Vitals:   12/16/17 0445 12/16/17 0446 12/16/17 0500 12/16/17 0515  BP: 98/68 98/68 (!) 88/70 (!) 86/65  Pulse: (!) 105 (!) 105 (!) 103 (!) 104  Resp: 15 18 17 17   Temp:      TempSrc:      SpO2:  92% 92% 92% 91%   General: Not in acute distress HEENT:       Eyes: no scleral icterus.       ENT: No discharge from the ears and nose       Neck: No JVD, no bruit, no mass felt. Heme: No neck lymph node enlargement. Cardiac: S1/S2, RRR, No murmurs, No gallops or rubs. Respiratory: has rhonchi diffusely GI: Soft, nondistended, nontender, no organomegaly, BS present. S/p of suprapubic catheter placement, PEG tube placement  GU: No hematuria Ext: No pitting leg edema bilaterally. 2+DP/PT pulse bilaterally. Musculoskeletal: No joint deformities, No joint redness or warmth, no limitation of ROM in spin. Skin: No rashes.  Neuro: pt is in coma, unresponsive. Has quadriplegia, pupils are dilated bilaterally (R>L, not responsive to the light). Psych: Patient is not psychotic  Labs on Admission: I have personally reviewed following labs and imaging studies  CBC: Recent Labs  Lab 12/16/17 0307  WBC 23.9*  NEUTROABS 22.0*  HGB 13.3  HCT 51.3*  MCV 110.8*  PLT 409*   Basic Metabolic Panel: Recent Labs  Lab 12/16/17 0307  NA 126*  K 3.2*  CL 85*  CO2 22  GLUCOSE 1,637*  BUN 41*  CREATININE 1.81*  CALCIUM 9.6   GFR: CrCl cannot be calculated (Unknown ideal weight.). Liver Function Tests: Recent Labs  Lab 12/16/17 0307  AST 30  ALT 24  ALKPHOS 171*  BILITOT 1.2  PROT 8.1  ALBUMIN 2.7*   No results for input(s): LIPASE, AMYLASE in the last 168 hours. No results for input(s): AMMONIA in the last 168 hours. Coagulation  Profile: No results for input(s): INR, PROTIME in the last 168 hours. Cardiac Enzymes: No results for input(s): CKTOTAL, CKMB, CKMBINDEX, TROPONINI in the last 168 hours. BNP (last 3 results) Recent Labs    04/28/17 1110  PROBNP 62.0   HbA1C: No results for input(s): HGBA1C in the last 72 hours. CBG: No results for input(s): GLUCAP in the last 168 hours. Lipid Profile: No results for input(s): CHOL, HDL, LDLCALC, TRIG, CHOLHDL, LDLDIRECT in the last 72 hours. Thyroid Function Tests: No results for input(s): TSH, T4TOTAL, FREET4, T3FREE, THYROIDAB in the last 72 hours. Anemia Panel: No results for input(s): VITAMINB12, FOLATE, FERRITIN, TIBC, IRON, RETICCTPCT in the last 72 hours. Urine analysis:    Component Value Date/Time   COLORURINE YELLOW 12/16/2017 0342   APPEARANCEUR CLOUDY (A) 12/16/2017 0342   LABSPEC 1.022 12/16/2017 0342   PHURINE 6.0 12/16/2017 0342   GLUCOSEU >=500 (A) 12/16/2017 0342   GLUCOSEU NEGATIVE 04/28/2017 1121   HGBUR LARGE (A) 12/16/2017 0342   BILIRUBINUR NEGATIVE 12/16/2017 0342   KETONESUR 5 (A) 12/16/2017 0342   PROTEINUR 30 (A) 12/16/2017 0342   UROBILINOGEN 0.2 04/28/2017 1121   NITRITE NEGATIVE 12/16/2017 0342   LEUKOCYTESUR MODERATE (A) 12/16/2017 0342   Sepsis Labs: @LABRCNTIP (procalcitonin:4,lacticidven:4) )No results found for this or any previous visit (from the past 240 hour(s)).   Radiological Exams on Admission: Dg Chest Port 1 View  Result Date: 12/16/2017 CLINICAL DATA:  Hypoxia EXAM: PORTABLE CHEST 1 VIEW COMPARISON:  03/09/2017 FINDINGS: Unchanged right basilar scarring. Left chest wall pacemaker leads are in unchanged position, projecting over the right atrium and right ventricle. Cardiomediastinal contours are normal. IMPRESSION: No active disease. Electronically Signed   By: Ulyses Jarred M.D.   On: 12/16/2017 03:28     EKG: Independently reviewed.  Poor quality, QTC 427, nonspecific T wave  change.  Assessment/Plan Principal Problem:   Hyperglycemic hyperosmolar nonketotic  coma ([place]) Active Problems:   Depression   Quadriplegia, C5-C7 incomplete (HCC)   Leukocytosis   Hypokalemia   Hypotension   Tobacco abuse   Asthma   Tracheostomy status (HCC)   Chronic diastolic CHF (congestive heart failure) (HCC)   Chronic obstructive pulmonary disease (HCC)   Presence of permanent cardiac pacemaker   PEG (percutaneous endoscopic gastrostomy) status (HCC)   Lactic acid acidosis   AKI (acute kidney injury) ([place])   Suprapubic catheter ([place])   HHNC (hyperglycemic hyperosmolar nonketotic coma) ([place])   Hyperglycemic hyperosmolar nonketotic coma ([place]): Blood sugar 1657, bicarbonate 22, anion gap 19, likely due to [facility] rather than DKA.  Her elevated anion gap is likely due to lactic acidosis, though cannot completely rule out DKA.  Patient is in coma, with bilateral dilated pupils which are not responsive to the light.  Patient is critically ill. PCCM, Dr. Lucious Groves was consulted.  - Admit to stepdown as inpt - 2L of NS bolus - start DKA protocol with BMP q4h - IVF: NS 100 cc/h; will switch to D5-1/2NS when CBG<250 - replete K as needed - Zofran prn nausea  - NPO  -f/u CT-head -will get beta hydroxybutyric acid level -check A1c  Leukocytosis: Likely due to [facility]. No fever, no source of infection identified.  Patient just had negative urine culture yesterday per caregiver.  Chest x-ray negative for infiltration. -blood culture x 2 -will get Procalcitonin and trend lactic acid levels per sepsis protocol. -IVF: as above  Hypotension: Likely due to dehydration. -Follow-up blood culture -IV fluid as above  Lactic acidosis: Likely due to dehydration. -IV fluid as above -Trend lactic acid levels  Depression: -hold home oral meds  Hypokalemia: K= 3.2 on admission. - Repleted by IV - Check Mg level  Quadriplegia, C5-C7 incomplete ([place]): -S/p of tracheostomy, suprapubic catheter  placement, PEG tube placement,   Tobacco abuse: -will give nicotine patch when pt wakes up.  Asthma and COPD: Has diffuse rhonchi bilaterally, indicating exacerbation. -Atrovent and xopenex nebulizer -Patient received 125 mg of Solu-Medrol in ED.  Chronic diastolic CHF (congestive heart failure) ([place]): no leg edema. No pulmonary edema on chest x-ray.  CHF seems to be compensated. -hold lasix   AKI: Likely due to prerenal secondary to dehydration and continuation of diuretics and NSAIDs - IVF as above - Follow up renal function by BMP -m hold lasix and Ibuprofen - f/u UA  GERD: -Pepcid IV  HLD: -hold oral meds now    DVT ppx: SQ Heparin      Code Status: Full code Family Communication:  Yes, patient's care giver at bed side Disposition Plan:  Anticipate discharge back to previous home environment Consults called:  PCCM, Dr. Oletta Darter Admission status:   SDU/inpation       Date of Service 12/16/2017    Ivor Costa [facility] Pager [phone]  If 7PM-7AM, please contact night-coverage [email or web address] Password [facility] 12/16/2017, 5:54 AM

## 2017-12-16 NOTE — Progress Notes (Signed)
Inpatient Diabetes Program Recommendations  AACE/ADA: New Consensus Statement on Inpatient Glycemic Control (2015)  Target Ranges:  Prepandial:   less than 140 mg/dL      Peak postprandial:   less than 180 mg/dL (1-2 hours)      Critically ill patients:  140 - 180 mg/dL   Lab Results  Component Value Date   GLUCAP 502 (HH) 12/16/2017   HGBA1C 5.6 06/13/2016  Results for Angela Page, Angela Page (MRN [id number]) as of 12/16/2017 13:38  Ref. Range 07/16/2017 08:54  Glucose Latest Ref Range: 70 - 99 mg/dL 138 (H)    Review of Glycemic Control Note admission glucose>1600 mg/dL  Diabetes history: None- last documented lab glucose in June of 2019 was 138 mg/dL Outpatient Diabetes medications: None Current orders for Inpatient glycemic control:  IV insulin/DKA order set Inpatient Diabetes Program Recommendations:    Reviewed chart. Note that dx. Is HCC (Hyperosmolar nonketotic coma).  However potential cause of such high blood sugars is not explained. No history of DM and past A1C and lab glucoses were within normal limits.  Agree with current orders. Unsure of patient's potential insulin needs at this time.  Will follow.  Thanks,  Adah Perl, RN, BC-ADM Inpatient Diabetes Coordinator Pager [phone] (8a-5p)

## 2017-12-16 NOTE — ED Notes (Signed)
Attempted to call report

## 2017-12-16 NOTE — Progress Notes (Signed)
CRITICAL VALUE ALERT  Critical Value:  Lactic acid  10.3   Date & Time Notied:  12/16/2017 745  Provider Notified: Dr. Garlan Fillers  Orders Received/Actions taken: 1 L bolus given   Continue to monitor patient

## 2017-12-16 NOTE — ED Notes (Addendum)
1st run of Kcl IV infusing , Insulin drip infusing at 5.4 units/hr per [employer] .

## 2017-12-16 NOTE — ED Notes (Signed)
Patient transported to CT scan . 

## 2017-12-16 NOTE — ED Notes (Signed)
Dr. Roxanne Mins notified on pt.'s hypotension , NS 2000 ml bolus infusing .

## 2017-12-16 NOTE — ED Provider Notes (Signed)
[facility] EMERGENCY DEPARTMENT Provider Note   CSN: [id number] Arrival date & time: 12/16/17  0243     History   Chief Complaint Chief Complaint  Patient presents with  . Unresponsive  . Hyperglycemia  . Wheezing    HPI Angela Page is a 47 y.o. female.  The history is provided by the EMS personnel. The history is limited by the condition of the patient (Unresponsive).  She has past history which includes quadriplegia, COPD, diastolic heart failure, tracheostomy and was brought in by ambulance because of being unresponsive.  Apparently, she had been coughing since possibly aspirating some Pepsi yesterday.  This evening, family member found her unresponsive.  Fire rescue was called and did sternal rub.  EMS transported the patient.  She was noted to be very hypoxic and was given albuterol via nebulizer with improvement in oxygenation.  She was noted to have significant wheezing.  She she was also given IV fluids through an intraosseous line.  CBG read high, although patient does not have a known history of diabetes,  Past Medical History:  Diagnosis Date  . Anasarca 06/10/2016  . Asthma   . Cocaine use 10/08/2013  . Depression   . GERD (gastroesophageal reflux disease)   . HCAP (healthcare-associated pneumonia) 06/09/2016  . Hepatitis    hx of hepatits frm mono   . Kidney stone   . Overdose of opiate or related narcotic ([place]) 09/02/2014  . Pacemaker   . Pleurisy   . Polysubstance dependence including opioid type drug, episodic abuse ([place]) 10/27/2013  . Protein calorie malnutrition ([place]) 10/31/2015  . Quadriparesis ([place])   . Quadriplegia, C5-C7 incomplete ([place]) 10/08/2013  . Stage IV pressure ulcer of sacral region [facility]) 10/31/2015    Patient Active Problem List   Diagnosis Date Noted  . Pressure injury of skin 07/17/2017  . Kidney stone 07/16/2017  . Right ureteral stone 03/10/2017  . UTI (urinary tract infection) 03/09/2017  . Ineffective  airway clearance   . Sepsis secondary to UTI ([place]) 06/30/2016  . Autonomic neuropathy 06/10/2016  . Presence of permanent cardiac pacemaker 06/10/2016  . 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 Chronically on opiate therapy 06/10/2016  . Indwelling Foley catheter present 06/10/2016  . PEG (percutaneous endoscopic gastrostomy) status ([place]) 06/10/2016  . Pulmonary hypertension ([place]) 06/10/2016  . Hypoalbuminemia 06/10/2016  . Abnormal transaminases   . Peripheral edema   . Tracheostomy dependence ([place])   . Chronic obstructive pulmonary disease ([place])   . Chronic diastolic CHF (congestive heart failure) ([place]) 01/23/2016  . Tracheostomy status ([place])   . Esophageal dysphagia   . Malnutrition of moderate degree 11/01/2015  . Tobacco abuse 10/31/2015  . Asthma 10/31/2015  . History of pneumonia 10/08/2015  . Pressure ulcer of contiguous region involving buttock and hip, stage 4 ([place]) 02/26/2015  . Hypotension 02/25/2015  . Leukocytosis 10/23/2014  . Hypokalemia 10/23/2014  . Decubitus ulcer of sacral region, stage 4 ([place]) 10/23/2014  . Anemia, iron deficiency 10/23/2014  . Quadriplegia, C5-C7 incomplete ([place]) 10/22/2014  . Depression   . Protein-calorie malnutrition, severe ([place]) 09/05/2014  . 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 Recurrent UTI 03/31/2014  . Pressure ulcer of coccygeal region, stage 4 ([place]) 01/06/2014  . Stage 4 skin ulcer of sacral region ([place]) 01/06/2014  . Neuropathic pain 11/30/2013  . Paraplegia following spinal cord injury ([place]) 11/30/2013  . Spinal cord injury, cervical region ([place]) 11/30/2013  . Chronic pain due to injury 11/30/2013  . S/P  cervical spinal fusion 10/27/2013  . Tracheostomy care ([place]) 10/27/2013  . 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 SCI (spinal cord injury) 10/11/2013    Past Surgical History:  Procedure Laterality Date  . APPENDECTOMY    . BACK SURGERY  10/08/2013   fusion of spine, cervical region    . CARDIAC SURGERY    . CYSTOSCOPY W/ URETERAL STENT PLACEMENT Right 03/09/2017   Procedure: CYSTOSCOPY [name] STENT PLACEMENT;  Surgeon: Irine Seal, MD;  Location: [facility];  Service: Urology;  Laterality: Right;  . CYSTOSCOPY W/ URETERAL STENT REMOVAL Right 07/16/2017   Procedure: CYSTOSCOPY WITH STENT REMOVAL;  Surgeon: Franchot Gallo, MD;  Location: [facility];  Service: Urology;  Laterality: Right;  . CYSTOSCOPY/URETEROSCOPY/HOLMIUM LASER/STENT PLACEMENT Right 07/16/2017   Procedure: CYSTOSCOPY/URETEROSCOPY/HOLMIUM LASER/STENT PLACEMENT/ALSO STONE EXTRACTION;  Surgeon: Franchot Gallo, MD;  Location: [facility];  Service: Urology;  Laterality: Right;  . GASTROSTOMY  11/23/2015  . I&D EXTREMITY  10/28/2011   Procedure: IRRIGATION AND DEBRIDEMENT EXTREMITY;  Surgeon: Tennis Must, MD;  Location: [facility];  Service: Orthopedics;  Laterality: Right;  Irrigation and debridement right middle finger  . INSERTION OF SUPRAPUBIC CATHETER N/A 07/28/2016   Procedure: INSERTION OF SUPRAPUBIC CATHETER;  Surgeon: Franchot Gallo, MD;  Location: [facility];  Service: Urology;  Laterality: N/A;  . IR GENERIC HISTORICAL  11/23/2015   IR GASTROSTOMY TUBE MOD SED 11/23/2015 [facility]-INTER[facility]  . LITHOTRIPSY  08/2017  . TRACHEOSTOMY       OB History   None      Home Medications    Prior to Admission medications   Medication Sig Start Date End Date Taking? Authorizing Provider  acetaminophen (TYLENOL) 500 MG tablet Take 1,000 mg by mouth every 6 (six) hours as needed for mild pain, moderate pain, fever or headache.     [provider]  albuterol (PROVENTIL HFA;VENTOLIN HFA) 108 (90 Base) MCG/ACT inhaler INHALE 1-2 PUFFS INTO THE LUNGS EVERY 6 (SIX) HOURS AS NEEDED FOR WHEEZING OR SHORTNESS OF BREATH. 05/25/17   Briscoe Deutscher, DO  albuterol (PROVENTIL) (2.5 MG/3ML) 0.083% nebulizer solution Take 2.5 mg by nebulization every 6 (six) hours as needed for wheezing or shortness of breath.     [provider]  alum & mag hydroxide-simeth (MAALOX PLUS) 400-400-40 MG/5ML suspension Take 10-20 mLs by mouth 4 (four) times daily as needed for indigestion.    [provider]  baclofen (LIORESAL) 20 MG tablet TAKE 1 TABLET BY MOUTH 3 TIMES A DAY 06/30/17   Briscoe Deutscher, DO  carboxymethylcellul-glycerin (REFRESH OPTIVE) 0.5-0.9 % ophthalmic solution 1 drop.    [provider]  [facility] MELATONIN 5 MG TABS TAKE 1 TABLET BY MOUTH EVERYDAY AT BEDTIME 11/09/17   Briscoe Deutscher, DO  DULoxetine (CYMBALTA) 60 MG capsule Take 1 capsule (60 mg total) by mouth 2 (two) times daily. 09/29/17   Kathrynn Ducking, MD  fenofibrate (TRICOR) 145 MG tablet Take 145 mg by mouth daily. 09/25/17   [provider]  furosemide (LASIX) 40 MG tablet Take 1 tablet (40 mg total) by mouth daily. 04/28/17   Briscoe Deutscher, DO  gabapentin (NEURONTIN) 600 MG tablet TAKE 1 TABLET (600 MG TOTAL) BY MOUTH 4 (FOUR) TIMES DAILY. 11/16/17   Kathrynn Ducking, MD  ibuprofen (ADVIL,MOTRIN) 200 MG tablet Place 2 tablets (400 mg total) into feeding tube every 6 (six) hours as needed for moderate pain. 11/26/15   Florencia Reasons, MD  KLOR-CON M20 20 MEQ tablet TAKE 1 TABLET BY MOUTH EVERY  DAY 10/19/17   Briscoe Deutscher, DO  Lactulose 20 GM/30ML SOLN Take 30 mLs (20 g total) by mouth 2 (two) times daily. 12/09/17 01/08/18  Jackquline Denmark, MD  linaclotide Rolan Lipa) 72 MCG capsule Take 1 capsule (72 mcg total) by mouth daily before breakfast. 07/14/17   Jackquline Denmark, MD  magnesium citrate SOLN Take 1 Bottle by mouth daily as needed for severe constipation.     [provider]  magnesium hydroxide (MILK OF MAGNESIA) 400 MG/5ML suspension Take 5 mLs by mouth daily as needed for mild constipation. 11/10/16   Briscoe Deutscher, DO  midodrine (PROAMATINE) 10 MG tablet PLACE 1 TABLET (10 MG TOTAL) INTO FEEDING TUBE 3 (THREE) TIMES DAILY WITH MEALS. 11/30/17   Briscoe Deutscher, DO  Nutritional Supplements (FEEDING SUPPLEMENT,  OSMOLITE 1.2 CAL,) LIQD Place 1,000 mLs into feeding tube continuous. Patient taking differently: Place 1,000 mLs into feeding tube continuous. 100 mLs Per Hour Over a 10 Hour Period=1,000 mLs. Feeding Given In The Evening From 2300 to 0900. 01/28/17   Briscoe Deutscher, DO  Nutritional Supplements (PROMOD) LIQD Give 30 mLs by tube 2 (two) times daily.     [provider]  nystatin (MYCOSTATIN) 100000 UNIT/ML suspension Take 5 mLs (500,000 Units total) by mouth 4 (four) times daily. Patient taking differently: Take 500,000 Units by mouth 4 (four) times daily as needed (tongue swelling).  12/09/16   Nat Christen, MD  omeprazole (PRILOSEC) 20 MG capsule Take 1 capsule (20 mg total) by mouth daily. 07/14/17   Jackquline Denmark, MD  ondansetron (ZOFRAN) 4 MG tablet TAKE 1 TABLET BY MOUTH EVERY 8 HOURS AS NEEDED FOR NAUSEA AND VOMITING 08/12/17   Briscoe Deutscher, DO  Oxycodone HCl 10 MG TABS Take 10 mg by mouth 4 (four) times daily as needed (for pain).     [provider]  OXYGEN Inhale 3 L into the lungs continuous.    [provider]  oxymetazoline (AFRIN) 0.05 % nasal spray Place 2 sprays into both nostrils 3 (three) times daily as needed for congestion.    [provider]  polyethylene glycol powder (GLYCOLAX/MIRALAX) powder Take 17 g by mouth daily. Patient taking differently: Take 17 g by mouth as needed.  07/14/17   Jackquline Denmark, MD  Prucalopride Succinate (MOTEGRITY) 2 MG TABS Take 2 mg by mouth daily. 11/18/17   Jackquline Denmark, MD  Prucalopride Succinate (MOTEGRITY) 2 MG TABS Take 2 mg by mouth daily. 11/18/17   Jackquline Denmark, MD  Skin Protectants, Misc. [facility] SKIN PROTECTANT EX) Apply 1 application topically 3 (three) times daily as needed (redness/irritation).     [provider]  tiotropium (SPIRIVA HANDIHALER) 18 MCG inhalation capsule Place 1 capsule (18 mcg total) into inhaler and inhale daily. 04/03/17   Erick Colace, NP  tiZANidine (ZANAFLEX) 2 MG  tablet TAKE 1 TABLET (2 MG TOTAL) BY MOUTH 3 (THREE) TIMES DAILY. 09/22/17   Kathrynn Ducking, MD  Water For Irrigation, Sterile (FREE WATER) SOLN Place 100 mLs into feeding tube every 8 (eight) hours. 11/26/15   Florencia Reasons, MD  zolpidem (AMBIEN) 10 MG tablet Take 10 mg by mouth at bedtime.    [provider]    Family History Family History  Problem Relation Age of Onset  . Hypertension Maternal Uncle   . Kidney failure Maternal Uncle   . Colon cancer Mother   . Lung cancer Father   . Hypertension Brother   . Kidney failure Maternal Aunt   . Kidney failure Maternal  Uncle   . Colon cancer Maternal Grandfather   . Esophageal cancer Neg Hx     Social History Social History   Tobacco Use  . Smoking status: Current Every Day Smoker    Packs/day: 1.00    Years: 25.00    Pack years: 25.00    Types: Cigarettes  . Smokeless tobacco: Never Used  Substance Use Topics  . Alcohol use: Never    Frequency: Never  . Drug use: No     Allergies   Erythromycin; Nitrofurantoin monohyd macro; Oxybutynin; and Penicillins   Review of Systems Review of Systems  Unable to perform ROS: Patient unresponsive     Physical Exam Updated Vital Signs BP (!) 82/73   Pulse (!) 107   Temp 98.7 F (37.1 C) (Rectal)   Resp 18   LMP 05/27/2016 (Within Days) Comment: Irregular periods since October 2015.  SpO2 92%   Physical Exam  Nursing note and vitals reviewed.  47 year old female, resting comfortably and in no acute distress. Vital signs are significant for rapid heart rate and low blood pressure. Oxygen saturation is 92%, which is normal. Head is normocephalic and atraumatic. PERRLA, EOMI. Oropharynx is clear.  Mucous membranes are dry. Neck is supple without adenopathy or JVD.  Tracheostomy in place. Lungs have coarse inspiratory and expiratory wheezes and rhonchi throughout. Chest is nontender. Heart has regular rate and rhythm without murmur. Abdomen is soft, flat, without  masses or hepatosplenomegaly and peristalsis is normoactive.  PEG tube and suprapubic catheter are present. Extremities have no cyanosis or edema, contractures are present. Skin is warm and dry without rash. Neurologic: Unresponsive to verbal and painful stimuli.  ED Treatments / Results  Labs (all labs ordered are listed, but only abnormal results are displayed) Labs Reviewed  CBC WITH DIFFERENTIAL/PLATELET - Abnormal; Notable for the following components:      Result Value   WBC 23.9 (*)    HCT 51.3 (*)    MCV 110.8 (*)    MCHC 25.9 (*)    Platelets 702 (*)    Neutro Abs 22.0 (*)    Abs Immature Granulocytes 0.12 (*)    All other components within normal limits  COMPREHENSIVE METABOLIC PANEL - Abnormal; Notable for the following components:   Sodium 126 (*)    Potassium 3.2 (*)    Chloride 85 (*)    Glucose, Bld 1,637 (*)    BUN 41 (*)    Creatinine, Ser 1.81 (*)    Albumin 2.7 (*)    Alkaline Phosphatase 171 (*)    GFR calc non Af Amer 33 (*)    GFR calc Af Amer 38 (*)    Anion gap 19 (*)    All other components within normal limits  BRAIN NATRIURETIC PEPTIDE - Abnormal; Notable for the following components:   B Natriuretic Peptide 280.4 (*)    All other components within normal limits  I-STAT ARTERIAL BLOOD GAS, ED - Abnormal; Notable for the following components:   pH, Arterial 7.193 (*)    pCO2 arterial 68.5 (*)    pO2, Arterial 42.0 (*)    Acid-base deficit 4.0 (*)    All other components within normal limits  I-STAT CG4 LACTIC ACID, ED - Abnormal; Notable for the following components:   Lactic Acid, Venous 3.99 (*)    All other components within normal limits  CULTURE, BLOOD (ROUTINE X 2)  CULTURE, BLOOD (ROUTINE X 2)  URINALYSIS, ROUTINE W REFLEX MICROSCOPIC  I-STAT TROPONIN, ED  I-STAT CG4 LACTIC ACID, ED    EKG EKG Interpretation  Date/Time:  Wednesday December 16 2017 02:54:40 EST Ventricular Rate:  110 PR Interval:    QRS Duration: 78 QT  Interval:  315 QTC Calculation: 427 R Axis:   45 Text Interpretation:  Sinus tachycardia Ventricular premature complex Aberrant conduction of SV complex(es) Biatrial enlargement Abnormal T, consider ischemia, lateral leads When compared with ECG of 03/09/2017, No significant change was found Confirmed by Delora Fuel ([id number]) on 12/16/2017 3:00:38 AM   Radiology Dg Chest Port 1 View  Result Date: 12/16/2017 CLINICAL DATA:  Hypoxia EXAM: PORTABLE CHEST 1 VIEW COMPARISON:  03/09/2017 FINDINGS: Unchanged right basilar scarring. Left chest wall pacemaker leads are in unchanged position, projecting over the right atrium and right ventricle. Cardiomediastinal contours are normal. IMPRESSION: No active disease. Electronically Signed   By: Ulyses Jarred M.D.   On: 12/16/2017 03:28    Procedures Procedures  CRITICAL CARE Performed by: Delora Fuel Total critical care time: 120 minutes Critical care time was exclusive of separately billable procedures and treating other patients. Critical care was necessary to treat or prevent imminent or life-threatening deterioration. Critical care was time spent personally by me on the following activities: development of treatment plan with patient and/or surrogate as well as nursing, discussions with consultants, evaluation of patient's response to treatment, examination of patient, obtaining history from patient or surrogate, ordering and performing treatments and interventions, ordering and review of laboratory studies, ordering and review of radiographic studies, pulse oximetry and re-evaluation of patient's condition.  Medications Ordered in ED Medications  ipratropium-albuterol (DUONEB) 0.5-2.5 (3) MG/3ML nebulizer solution 3 mL (has no administration in time range)  methylPREDNISolone sodium succinate (SOLU-MEDROL) 125 mg/2 mL injection 125 mg (has no administration in time range)     Initial Impression / Assessment and Plan / ED Course  I have reviewed  the triage vital signs and the nursing notes.  Pertinent labs & imaging results that were available during my care of the patient were reviewed by me and considered in my medical decision making (see chart for details).  Altered mental status in patient with reported a CBG.  Worrisome for new onset DKA or hyperosmolar coma.  Wheezes and rhonchi, possible pneumonia, possible exacerbation of heart failure.  Screening labs are obtained as is chest x-ray.  Old records are reviewed, and she is noted to have a prior hospital admission for sepsis.  Lactic acid level has come back elevated at 5.  She is given aggressive IV fluids.  However, no clear evidence that this is sepsis and antibiotics are not administered at this point.  She did have an episode of hypotension which did respond to IV fluids.  Chest x-ray shows no evidence of pneumonia.  Labs show markedly elevated glucose of 1637, as well as evidence of acute kidney injury with creatinine 1.81 (creatinine was 0.78 on July 16, 2017).  Mild hypokalemia is noted.  Sodium is 126 which makes her severely hyperosmolar, which probably accounts for her altered mentation.  Calculated osmolality is 358.  She is started on glucose stabilizer.  Case is discussed with Dr. Blaine Hamper of [facility] hospitalist, who agrees to admit the patient.  Final Clinical Impressions(s) / ED Diagnoses   Final diagnoses:  Hyperglycemic hyperosmolar nonketotic coma ([place])  Acute kidney injury (nontraumatic) ([facility])  Elevated lactic acid level  Hypokalemia  Thrombocytosis [facility])    ED Discharge Orders    None       Delora Fuel, MD [birth year] [phone]

## 2017-12-16 NOTE — ED Notes (Signed)
Patient  will open her eyes briefly but remained somnolent/lethargic .

## 2017-12-16 NOTE — ED Notes (Signed)
Critical care MD at bedside evaluating pt.

## 2017-12-16 NOTE — Progress Notes (Signed)
Pt receiving neb tx upon arrival. Pt with #6 shiley cuffless trach (capped). RT removed cap, placed inner cannula, and placed pt on ATC. RT collected ABG, but sample was mixed venous. Pt continuing to desat into the 80's. RT suctioned trach returning moderate amount of very yellow, thick secretions. Extra trach at bedside as well as cuffed trach in case pt needs to be vented. FiO2 increased to 98% post suctioning. Will give pt time to re-recruit, and will wean O2 as tolerated. RT will continue to monitor.

## 2017-12-16 NOTE — ED Notes (Signed)
Dr. Blaine Hamper ( admitting MD ) advised nurse not to transport pt. to assigned room until pt. seen by critical care MD at emergency dept.

## 2017-12-16 NOTE — H&P (Signed)
NAME:  Angela Page, MRN:  [id number], DOB:  [birth year], LOS: 0 ADMISSION DATE:  12/16/2017, CONSULTATION DATE:  12/16/2017 REFERRING MD:  Dr. Blaine Hamper, CHIEF COMPLAINT:  Acute encephalopathy  Brief History   24 yoF w/hx quadriplegia/trach/ peg/ suprapubic cath from home unresponsive after several episodes of nausea/ vomiting found to have glucose 1637 and hypotension, started on IVF and insulin gtt.  History of present illness   HPI obtained from medical chart review as patient is encephalopathic and per caregiver, Alice at bedside.  47 year old female with past medical history of quadriplegia due to C5-C7 injury from fall with tracheostomy, PEG tube, and suprapubic catheter, tobacco use, asthma, diastolic heart failure, chronic pain, opioid induced constipation, depression, GERD, 13-calorie malnutrition, and substance abuse presenting from home unresponsive.  Patient is from home.  She receives around-the-clock care and requires full care.  She continues to smoke around a pack per day and which somebody holds her cigarette for her.  Per caregiver, Danton Clap, at bedside patient last week refused 5 days of her tube feedings because she thought it was making her fat.  Patient struggles with opioid-induced constipation however most of her stools are liquid.  Most of her feedings are through her PEG tube patient is able to take some things orally. On the 25th, caregiver states that she was excessively drinking water which is abnormal for her as Pepsi as her favorite and noted to have over 4 L of urine output that day.  She developed nausea with vomiting with several episodes yesterday and possibly coughed up what caregiver thought was Pepsi.  Overnight patient called out for help and then went unresponsive with caregiver.  Patient was unresponsive to sternal rub on EMS arrival with CBG reading high.  In the ER she was noted to be hypotensive with a systolic blood pressure in the 70s.  Labs noted for  glucose 1657, AG 19, bicarb 22, sCr 1.81, K 3.2, Na 126 (corrects to 151), WBC 23.9.  CXR negative for infiltrate.  CT head pending as some concern of unequal pupils in ER. Treated with 2L NS with improvement in SBP and started on insulin gtt. ABG noted for 7.193/ 68.5/ 42 /26.4.  PCCM called to admit to ICU.   Past Medical History  quadriplegia due to C5-C7 injury from fall with tracheostomy, PEG tube, and suprapubic catheter, tobacco use, asthma, diastolic heart failure, chronic pain, opioid induced constipation, depression, GERD, 13-calorie malnutrition, and substance abuse  Significant Hospital Events   11/27 Admitted  Consults:   Procedures:  Prior Trach, PEG, suprapubic catheter  Significant Diagnostic Tests:  11/27 [facility] >>  Micro Data:  11/27 BCx 2>> 11/27 UC >>  Antimicrobials:   Interim history/subjective:    Objective   Blood pressure (!) 86/65, pulse (!) 104, temperature 98.7 F (37.1 C), temperature source Rectal, resp. rate 17, last menstrual period 05/27/2016, SpO2 91 %.   Intake/Output Summary (Last 24 hours) at 12/16/2017 0553 Last data filed at 12/16/2017 0538 Gross per 24 hour  Intake 2100 ml  Output -  Net 2100 ml   There were no vitals filed for this visit.  Examination: General:  Chronically ill appearing female lying in bed in NAD HEENT: MM pink/dry, pupils 4/reactive, no JVD, midline cuffless 6 trach- site wnl Neuro: Opens eyes to verbal, mouths some questions, becoming more alert, minimal movement in UE, flaccid lower CV: SR, 1 pulses PULM: even/non-labored, lungs bilaterally rhonchi L>R, spo2 > 94% on TC GI: obese, soft, non-tender,  bs hypo, peg tube site wnl, suprapubic catheter in place draining cyu Extremities: warm/dry, no LE edema, extreme atrophy, contractures in all extremities Skin: no rashes   Resolved Hospital Problem list    Assessment & Plan:  Hyperglycemic hyperosmolar nonketotic coma - glucose 1657 -  No prior history of  diabetes P:  ICU monitoring Additional LR bolus 2L now Insulin gtt per glucostabilizer LR at 250 ml/hr till glucose < 250 and switch to D5/1/2 NS  BMET q 4 Pending beta hydroxybutyric and HgbA1c NPO Frequent neuro checks CTH pending  Acute on chronic hypotension  - on midodrine - exacerbated by hypovolemia - no infectious symptoms per caregiver other than spitting/ coughing up some pepsi P:  Additional fluids as above Continue midodrine 10 mg TID per tube  Trend lactate Goal MAP >65  AGMA likely due to lactic acidosis related to hypovolemia/hypotension Pseudohyponatremia- corrects to 151 related to hyperosmolar state P:  IV fluids as above   AKI P:  Monitor UOP, strict I/O's BMETs q4,   Hypokalemia P:  S/p replete Check mag  Leukocytosis - possibly reactive, urine with mod leuk's, afebrile, reported negative recent UC - CXR neg P:  Send UC Assess PCT Monitor clinically for now  Ongoing tobacco abuse, Asthma, chronic trach P:  Continue supplemental O2  No need to exchange to cuffed trach at this time, mental status seems to be improving with good air exchange Repeat ABG if no significant improvement to determine if prn MV needed Xopenex/ atrovent nebs Tobacco counseling cessation when able Trach care per protocol   Quadriplegia, C5-C7, chronic pain P:  Hold home zanaflex, baclofen, oxycodone, ambien till mental status improves  Diastolic HF, HLD P:  Hold home lasix, fenofibrate  Opoid induced constipation P:  Hold home lactulose, miralax, linzess   Protein calorie malnutrition P:  Resume TF when AG closes  Best practice:  Diet: NPO till AG closes Pain/Anxiety/Delirium protocol (if indicated): n/a VAP protocol (if indicated): n/a DVT prophylaxis: heparin SQ GI prophylaxis: pepcid Glucose control: glucostabilzer  Mobility: BR Code Status: Full Family Communication: Patient's caregiver at bedside updated on plan of care Disposition:  ICU  Labs   CBC: Recent Labs  Lab 12/16/17 0307  WBC 23.9*  NEUTROABS 22.0*  HGB 13.3  HCT 51.3*  MCV 110.8*  PLT 702*    Basic Metabolic Panel: Recent Labs  Lab 12/16/17 0307  NA 126*  K 3.2*  CL 85*  CO2 22  GLUCOSE 1,637*  BUN 41*  CREATININE 1.81*  CALCIUM 9.6   GFR: CrCl cannot be calculated (Unknown ideal weight.). Recent Labs  Lab 12/16/17 0307 12/16/17 0313 12/16/17 0521  WBC 23.9*  --   --   LATICACIDVEN  --  3.99* 3.51*    Liver Function Tests: Recent Labs  Lab 12/16/17 0307  AST 30  ALT 24  ALKPHOS 171*  BILITOT 1.2  PROT 8.1  ALBUMIN 2.7*   No results for input(s): LIPASE, AMYLASE in the last 168 hours. No results for input(s): AMMONIA in the last 168 hours.  ABG    Component Value Date/Time   PHART 7.193 (LL) 12/16/2017 0329   PCO2ART 68.5 (HH) 12/16/2017 0329   PO2ART 42.0 (L) 12/16/2017 0329   HCO3 26.4 12/16/2017 0329   TCO2 28 12/16/2017 0329   ACIDBASEDEF 4.0 (H) 12/16/2017 0329   O2SAT 64.0 12/16/2017 0329     Coagulation Profile: No results for input(s): INR, PROTIME in the last 168 hours.  Cardiac Enzymes: No results for input(s):  CKTOTAL, CKMB, CKMBINDEX, TROPONINI in the last 168 hours.  HbA1C: Hgb A1c MFr Bld  Date/Time Value Ref Range Status  06/13/2016 05:00 AM 5.6 4.8 - 5.6 % Final    Comment:    (NOTE)         Pre-diabetes: 5.7 - 6.4         Diabetes: >6.4         Glycemic control for adults with diabetes: <7.0     CBG: No results for input(s): GLUCAP in the last 168 hours.  Review of Systems:   Unable to obtain  Past Medical History  She,  has a past medical history of Anasarca (06/10/2016), Asthma, Cocaine use (10/08/2013), Depression, GERD (gastroesophageal reflux disease), HCAP (healthcare-associated pneumonia) (06/09/2016), Hepatitis, Kidney stone, Overdose of opiate or related narcotic ([place]) (09/02/2014), Pacemaker, Pleurisy, Polysubstance dependence including opioid type drug, episodic abuse  ([place]) (10/27/2013), Protein calorie malnutrition ([place]) (10/31/2015), Quadriparesis ([place]), Quadriplegia, C5-C7 incomplete ([place]) (10/08/2013), and Stage IV pressure ulcer of sacral region ([place]) (10/31/2015).   Surgical History    Past Surgical History:  Procedure Laterality Date  . APPENDECTOMY    . BACK SURGERY  10/08/2013   fusion of spine, cervical region  . CARDIAC SURGERY    . CYSTOSCOPY W/ URETERAL STENT PLACEMENT Right 03/09/2017   Procedure: CYSTOSCOPY [name] STENT PLACEMENT;  Surgeon: Irine Seal, MD;  Location: [facility];  Service: Urology;  Laterality: Right;  . CYSTOSCOPY W/ URETERAL STENT REMOVAL Right 07/16/2017   Procedure: CYSTOSCOPY WITH STENT REMOVAL;  Surgeon: Franchot Gallo, MD;  Location: [facility];  Service: Urology;  Laterality: Right;  . CYSTOSCOPY/URETEROSCOPY/HOLMIUM LASER/STENT PLACEMENT Right 07/16/2017   Procedure: CYSTOSCOPY/URETEROSCOPY/HOLMIUM LASER/STENT PLACEMENT/ALSO STONE EXTRACTION;  Surgeon: Franchot Gallo, MD;  Location: [facility];  Service: Urology;  Laterality: Right;  . GASTROSTOMY  11/23/2015  . I&D EXTREMITY  10/28/2011   Procedure: IRRIGATION AND DEBRIDEMENT EXTREMITY;  Surgeon: Tennis Must, MD;  Location: [place];  Service: Orthopedics;  Laterality: Right;  Irrigation and debridement right middle finger  . INSERTION OF SUPRAPUBIC CATHETER N/A 07/28/2016   Procedure: INSERTION OF SUPRAPUBIC CATHETER;  Surgeon: Franchot Gallo, MD;  Location: [facility];  Service: Urology;  Laterality: N/A;  . IR GENERIC HISTORICAL  11/23/2015   IR GASTROSTOMY TUBE MOD SED 11/23/2015 [facility]  . LITHOTRIPSY  08/2017  . TRACHEOSTOMY       Social History   reports that she has been smoking cigarettes. She has a 25.00 pack-year smoking history. She has never used smokeless tobacco. She reports that she does not drink alcohol or use drugs.   Family History   Her family history includes Colon cancer in her maternal grandfather and mother;  Hypertension in her brother and maternal uncle; Kidney failure in her maternal aunt, maternal uncle, and maternal uncle; Lung cancer in her father. There is no history of Esophageal cancer.   Allergies Allergies  Allergen Reactions  . Erythromycin Nausea And Vomiting  . Nitrofurantoin Monohyd Macro Nausea And Vomiting  . Oxybutynin     Dries mouth out   . Penicillins Hives    Has patient had a PCN reaction causing immediate rash, facial/tongue/throat swelling, SOB or lightheadedness with hypotension: Yes Has patient had a PCN reaction causing severe rash involving mucus membranes or skin necrosis: Yes Has patient had a PCN reaction that required hospitalization No Has patient had a PCN reaction occurring within the last 10 years: No-childhood allergy If all of the above answers are "NO", then may proceed with  Cephalosporin      Home Medications  Prior to Admission medications   Medication Sig Start Date End Date Taking? Authorizing Provider  acetaminophen (TYLENOL) 500 MG tablet Take 1,000 mg by mouth every 6 (six) hours as needed for mild pain, moderate pain, fever or headache.    Yes [provider]  albuterol (PROVENTIL) (2.5 MG/3ML) 0.083% nebulizer solution Take 2.5 mg by nebulization every 6 (six) hours as needed for wheezing or shortness of breath.   Yes [provider]  alum & mag hydroxide-simeth (MAALOX PLUS) 400-400-40 MG/5ML suspension Take 10-20 mLs by mouth 4 (four) times daily as needed for indigestion.   Yes [provider]  [employer] MELATONIN 5 MG TABS TAKE 1 TABLET BY MOUTH EVERYDAY AT BEDTIME Patient taking differently: Take 5 mg by mouth at bedtime.  11/09/17  Yes Briscoe Deutscher, DO  fenofibrate (TRICOR) 145 MG tablet Take 145 mg by mouth daily. 09/25/17  Yes [provider]  furosemide (LASIX) 40 MG tablet Take 1 tablet (40 mg total) by mouth daily. 04/28/17  Yes Briscoe Deutscher, DO  ibuprofen (ADVIL,MOTRIN) 200 MG tablet Place 2 tablets (400  mg total) into feeding tube every 6 (six) hours as needed for moderate pain. 11/26/15  Yes Florencia Reasons, MD  KLOR-CON M20 20 MEQ tablet TAKE 1 TABLET BY MOUTH EVERY DAY Patient taking differently: Take 20 mEq by mouth daily.  10/19/17  Yes Briscoe Deutscher, DO  Lactulose 20 GM/30ML SOLN Take 30 mLs (20 g total) by mouth 2 (two) times daily. 12/09/17 01/08/18 Yes Jackquline Denmark, MD  lidocaine (LIDODERM) 5 % Place 1 patch onto the skin 2 (two) times daily as needed (pain). Remove & Discard patch within 12 hours or as directed by MD   Yes [provider]  linaclotide (LINZESS) 72 MCG capsule Take 1 capsule (72 mcg total) by mouth daily before breakfast. 07/14/17  Yes Jackquline Denmark, MD  magnesium citrate SOLN Take 1 Bottle by mouth daily as needed for severe constipation.    Yes [provider]  magnesium hydroxide (MILK OF MAGNESIA) 400 MG/5ML suspension Take 5 mLs by mouth daily as needed for mild constipation. 11/10/16  Yes Briscoe Deutscher, DO  Methylnaltrexone Bromide (RELISTOR) 150 MG TABS Take 150 mg by mouth 3 (three) times daily as needed (constipation).   Yes [provider]  Nutritional Supplements (FEEDING SUPPLEMENT, OSMOLITE 1.2 CAL,) LIQD Place 1,000 mLs into feeding tube continuous. Patient taking differently: Place 1,000 mLs into feeding tube continuous. 100 mLs Per Hour Over a 10 Hour Period=1,000 mLs. Feeding Given In The Evening From 2300 to 0900. 01/28/17  Yes Briscoe Deutscher, DO  nystatin (MYCOSTATIN) 100000 UNIT/ML suspension Take 5 mLs (500,000 Units total) by mouth 4 (four) times daily. Patient taking differently: Take 500,000 Units by mouth 4 (four) times daily as needed (tongue swelling).  12/09/16  Yes Nat Christen, MD  omeprazole (PRILOSEC) 20 MG capsule Take 1 capsule (20 mg total) by mouth daily. 07/14/17  Yes Jackquline Denmark, MD  ondansetron (ZOFRAN) 4 MG tablet TAKE 1 TABLET BY MOUTH EVERY 8 HOURS AS NEEDED FOR NAUSEA AND VOMITING Patient taking differently: Take 4  mg by mouth every 8 (eight) hours as needed for nausea or vomiting.  08/12/17  Yes Briscoe Deutscher, DO  oxybutynin (DITROPAN) 5 MG tablet Take 5 mg by mouth every 8 (eight) hours as needed for bladder spasms.   Yes [provider]  Oxycodone HCl 10 MG TABS Take 10 mg by mouth See admin instructions.  Four times daily and as needed per pts request   Yes [provider]  OXYGEN Inhale 3 L into the lungs continuous.   Yes [provider]  polyethylene glycol powder (GLYCOLAX/MIRALAX) powder Take 17 g by mouth daily. Patient taking differently: Take 17 g by mouth daily as needed for mild constipation.  07/14/17  Yes Jackquline Denmark, MD  Skin Protectants, Misc. [facility] SKIN PROTECTANT EX) Apply 1 application topically 3 (three) times daily as needed (redness/irritation).    Yes [provider]  tiotropium (SPIRIVA HANDIHALER) 18 MCG inhalation capsule Place 1 capsule (18 mcg total) into inhaler and inhale daily. 04/03/17  Yes Erick Colace, NP  Water For Irrigation, Sterile (FREE WATER) SOLN Place 100 mLs into feeding tube every 8 (eight) hours. 11/26/15  Yes Florencia Reasons, MD  zolpidem (AMBIEN) 10 MG tablet Take 10 mg by mouth at bedtime as needed for sleep.    Yes [provider]  albuterol (PROVENTIL HFA;VENTOLIN HFA) 108 (90 Base) MCG/ACT inhaler INHALE 1-2 PUFFS INTO THE LUNGS EVERY 6 (SIX) HOURS AS NEEDED FOR WHEEZING OR SHORTNESS OF BREATH. Patient not taking: Reported on 12/16/2017 05/25/17   Briscoe Deutscher, DO  baclofen (LIORESAL) 20 MG tablet TAKE 1 TABLET BY MOUTH 3 TIMES A DAY Patient not taking: Reported on 12/16/2017 06/30/17   Briscoe Deutscher, DO  DULoxetine (CYMBALTA) 60 MG capsule Take 1 capsule (60 mg total) by mouth 2 (two) times daily. Patient not taking: Reported on 12/16/2017 09/29/17   Kathrynn Ducking, MD  gabapentin (NEURONTIN) 600 MG tablet TAKE 1 TABLET (600 MG TOTAL) BY MOUTH 4 (FOUR) TIMES DAILY. Patient not taking: Reported on 12/16/2017  11/16/17   Kathrynn Ducking, MD  midodrine (PROAMATINE) 10 MG tablet PLACE 1 TABLET (10 MG TOTAL) INTO FEEDING TUBE 3 (THREE) TIMES DAILY WITH MEALS. Patient not taking: Reported on 12/16/2017 11/30/17   Briscoe Deutscher, DO  Prucalopride Succinate (MOTEGRITY) 2 MG TABS Take 2 mg by mouth daily. Patient not taking: Reported on 12/16/2017 11/18/17   Jackquline Denmark, MD  Prucalopride Succinate (MOTEGRITY) 2 MG TABS Take 2 mg by mouth daily. Patient not taking: Reported on 12/16/2017 11/18/17   Jackquline Denmark, MD  tiZANidine (ZANAFLEX) 2 MG tablet TAKE 1 TABLET (2 MG TOTAL) BY MOUTH 3 (THREE) TIMES DAILY. Patient not taking: Reported on 12/16/2017 09/22/17   Kathrynn Ducking, MD     Critical care time: 85 mins    Kennieth Rad, [place] Pgr: [phone] or if no answer [phone] 12/16/2017, 6:37 AM

## 2017-12-17 ENCOUNTER — Inpatient Hospital Stay (HOSPITAL_COMMUNITY): Payer: Medicare Other

## 2017-12-17 LAB — POCT I-STAT 3, ART BLOOD GAS (G3+)
Acid-base deficit: 4 mmol/L — ABNORMAL HIGH (ref 0.0–2.0)
Bicarbonate: 22.4 mmol/L (ref 20.0–28.0)
O2 Saturation: 95 %
PH ART: 7.299 — AB (ref 7.350–7.450)
Patient temperature: 100.1
TCO2: 24 mmol/L (ref 22–32)
pCO2 arterial: 46 mmHg (ref 32.0–48.0)
pO2, Arterial: 86 mmHg (ref 83.0–108.0)

## 2017-12-17 LAB — GLUCOSE, CAPILLARY
GLUCOSE-CAPILLARY: 294 mg/dL — AB (ref 70–99)
GLUCOSE-CAPILLARY: 326 mg/dL — AB (ref 70–99)
Glucose-Capillary: 336 mg/dL — ABNORMAL HIGH (ref 70–99)
Glucose-Capillary: 312 mg/dL — ABNORMAL HIGH (ref 70–99)
Glucose-Capillary: 267 mg/dL — ABNORMAL HIGH (ref 70–99)
Glucose-Capillary: 214 mg/dL — ABNORMAL HIGH (ref 70–99)
Glucose-Capillary: 144 mg/dL — ABNORMAL HIGH (ref 70–99)

## 2017-12-17 LAB — BASIC METABOLIC PANEL
Anion gap: 11 (ref 5–15)
BUN: 13 mg/dL (ref 6–20)
CO2: 26 mmol/L (ref 22–32)
Calcium: 8.8 mg/dL — ABNORMAL LOW (ref 8.9–10.3)
Chloride: 104 mmol/L (ref 98–111)
Creatinine, Ser: 0.94 mg/dL (ref 0.44–1.00)
GFR calc Af Amer: >60 mL/min (ref >60)
Glucose, Bld: 220 mg/dL — ABNORMAL HIGH (ref 70–99)
Potassium: 3.1 mmol/L — ABNORMAL LOW (ref 3.5–5.1)
Sodium: 141 mmol/L (ref 135–145)

## 2017-12-17 LAB — CBC
HCT: 40.9 % (ref 36.0–46.0)
Hemoglobin: 12.7 g/dL (ref 12.0–15.0)
MCH: 28.6 pg (ref 26.0–34.0)
MCHC: 31.1 g/dL (ref 30.0–36.0)
MCV: 92.1 fL (ref 80.0–100.0)
PLATELETS: 567 10*3/uL — AB (ref 150–400)
RBC: 4.44 MIL/uL (ref 3.87–5.11)
RDW: 14.1 % (ref 11.5–15.5)
WBC: 22.9 10*3/uL — ABNORMAL HIGH (ref 4.0–10.5)
nRBC: 0 % (ref 0.0–0.2)

## 2017-12-17 LAB — URINE CULTURE

## 2017-12-17 LAB — PHOSPHORUS
PHOSPHORUS: 3.7 mg/dL (ref 2.5–4.6)
Phosphorus: 1.9 mg/dL — ABNORMAL LOW (ref 2.5–4.6)

## 2017-12-17 LAB — HEMOGLOBIN A1C
HEMOGLOBIN A1C: 11.8 % — AB (ref 4.8–5.6)
Hgb A1c MFr Bld: 11.5 % — ABNORMAL HIGH (ref 4.8–5.6)
MEAN PLASMA GLUCOSE: 292 mg/dL
Mean Plasma Glucose: 283.35 mg/dL

## 2017-12-17 LAB — MAGNESIUM
Magnesium: 2.7 mg/dL — ABNORMAL HIGH (ref 1.7–2.4)
Magnesium: 2.2 mg/dL (ref 1.7–2.4)

## 2017-12-17 IMAGING — DX DG CHEST 1V PORT
1 series · 1 of 1 positions shown · non-contrast
Comparison: 11/12/2015.

CLINICAL DATA: Respiratory failure.

EXAM:
PORTABLE CHEST 1 VIEW

[chest ap]
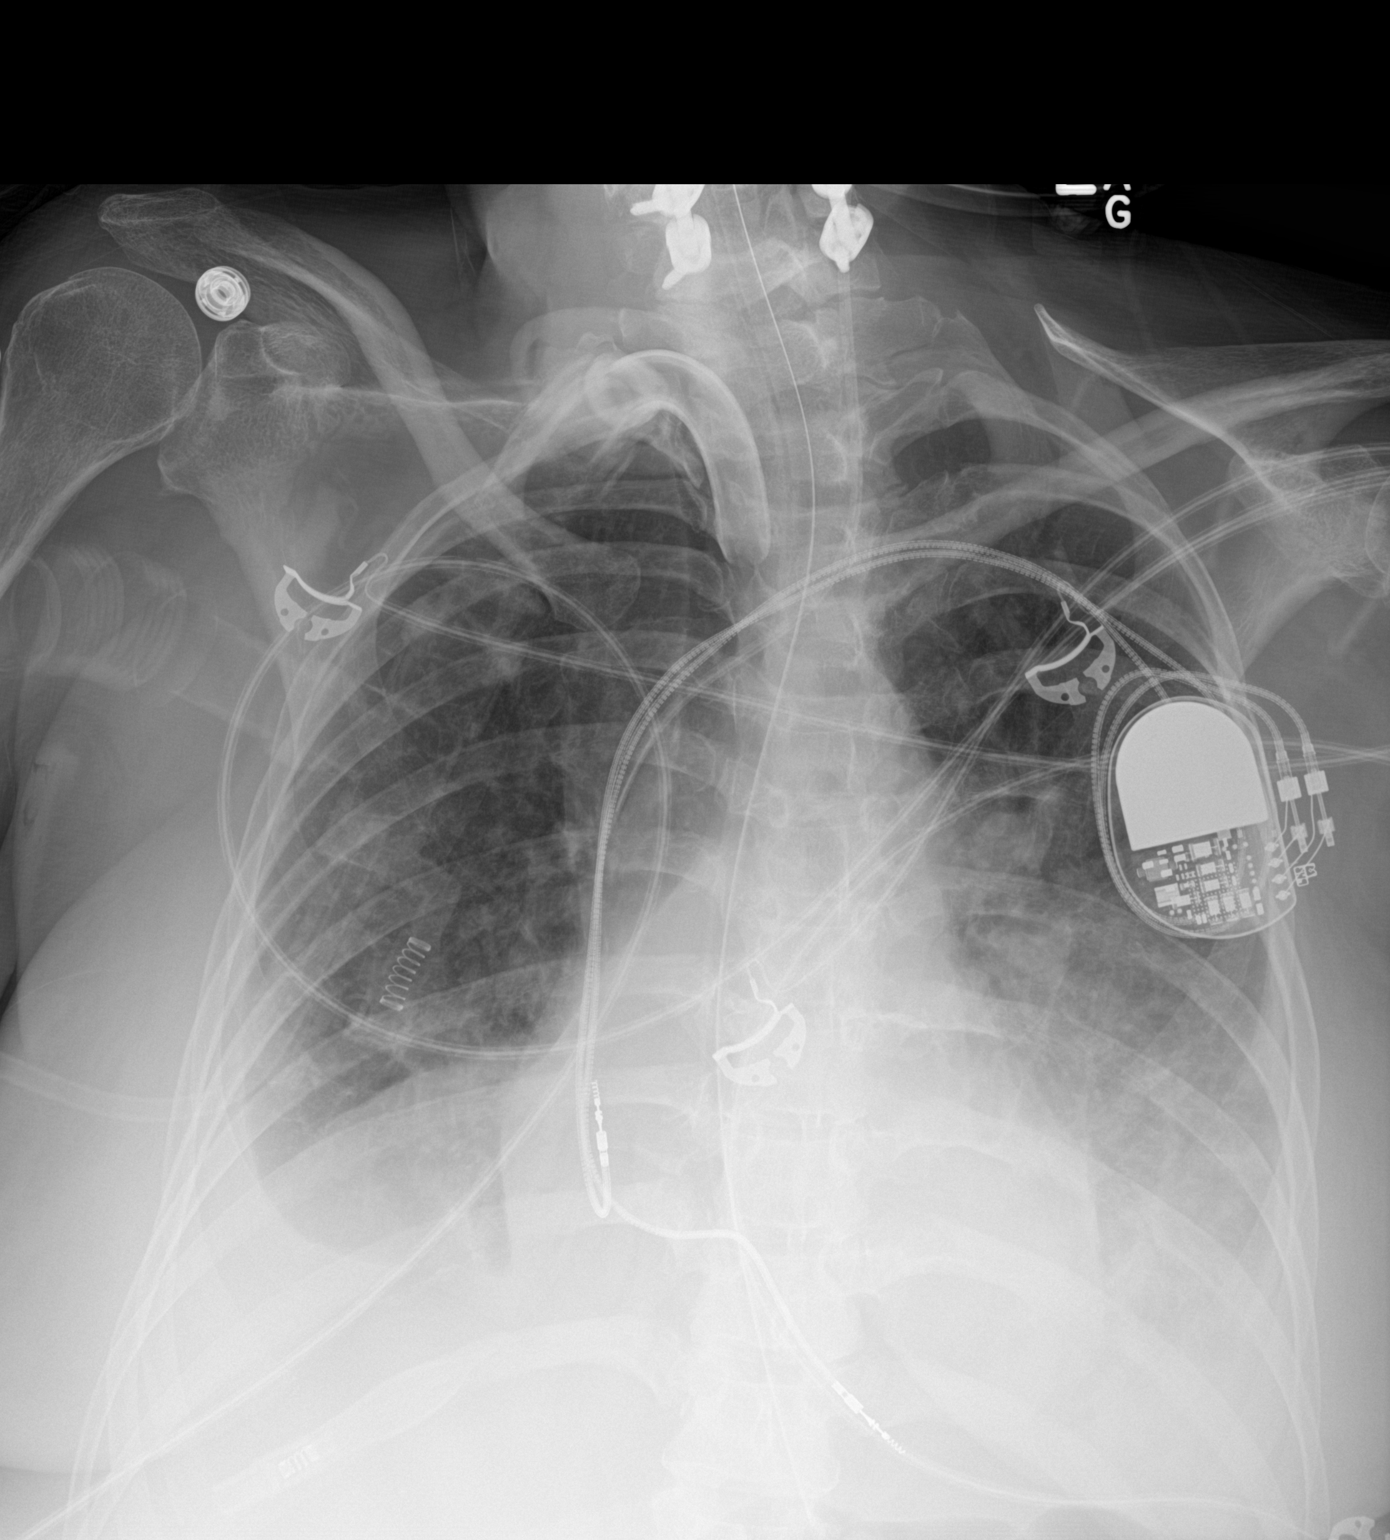

[1 of 1 positions shown; findings below may reference images not displayed]

FINDINGS: Tracheostomy tube and left IJ line in stable position. Cardiac pacer
stable position. Heart size stable. Low lung volumes. Persistent
bilateral lower lobe airspace disease . Persistent bilateral pleural
effusions. Prior cervical spine fusion.
IMPRESSION: 1. Interim placement of NG tube, its tip is below left
hemidiaphragm. Tracheostomy tube and left IJ line stable position.

2. Persistent low lung volumes. Persistent unchanged bilateral lower
lobe airspace disease and small pleural effusions.

## 2017-12-17 MED ORDER — VITAL HIGH PROTEIN PO LIQD
1000.0000 mL | ORAL | Status: DC
  Administered 2017-12-17 (×2): 1000 mL

## 2017-12-17 MED ORDER — FENTANYL CITRATE (PF) 100 MCG/2ML IJ SOLN
INTRAMUSCULAR | Status: AC
  Administered 2017-12-17: 25 ug via INTRAVENOUS
  Filled 2017-12-17: qty 2

## 2017-12-17 MED ORDER — FUROSEMIDE 10 MG/ML IJ SOLN
40.0000 mg | Freq: Once | INTRAMUSCULAR | Status: AC
  Administered 2017-12-17: 40 mg via INTRAVENOUS
  Filled 2017-12-17: qty 4

## 2017-12-17 MED ORDER — BACLOFEN 20 MG PO TABS
20.0000 mg | ORAL_TABLET | Freq: Three times a day (TID) | ORAL | Status: DC
  Administered 2017-12-17 – 2017-12-20 (×9): 20 mg
  Filled 2017-12-17 (×10): qty 1

## 2017-12-17 MED ORDER — GABAPENTIN 600 MG PO TABS
600.0000 mg | ORAL_TABLET | Freq: Three times a day (TID) | ORAL | Status: DC
  Administered 2017-12-17 – 2017-12-20 (×9): 600 mg
  Filled 2017-12-17 (×10): qty 1

## 2017-12-17 MED ORDER — LINACLOTIDE 145 MCG PO CAPS
145.0000 ug | ORAL_CAPSULE | Freq: Every day | ORAL | Status: DC
  Administered 2017-12-18 – 2017-12-20 (×3): 145 ug
  Filled 2017-12-17 (×3): qty 1

## 2017-12-17 MED ORDER — INSULIN REGULAR(HUMAN) IN NACL 100-0.9 UT/100ML-% IV SOLN
INTRAVENOUS | Status: DC
  Administered 2017-12-17: 2.8 [IU]/h via INTRAVENOUS
  Administered 2017-12-18: 15.1 [IU]/h via INTRAVENOUS
  Filled 2017-12-17 (×4): qty 100

## 2017-12-17 MED ORDER — PRO-STAT SUGAR FREE PO LIQD
30.0000 mL | Freq: Two times a day (BID) | ORAL | Status: DC
  Administered 2017-12-17 – 2017-12-18 (×2): 30 mL
  Filled 2017-12-17 (×2): qty 30

## 2017-12-17 MED ORDER — INSULIN ASPART 100 UNIT/ML ~~LOC~~ SOLN: 2.0000 [IU] | SUBCUTANEOUS | Status: DC

## 2017-12-17 MED ORDER — LACTULOSE 10 GM/15ML PO SOLN
30.0000 g | Freq: Two times a day (BID) | ORAL | Status: DC
  Administered 2017-12-17 – 2017-12-20 (×6): 30 g
  Filled 2017-12-17 (×6): qty 45

## 2017-12-17 MED ORDER — LEVOFLOXACIN IN D5W 750 MG/150ML IV SOLN
750.0000 mg | INTRAVENOUS | Status: DC
  Administered 2017-12-17: 750 mg via INTRAVENOUS
  Filled 2017-12-17: qty 150

## 2017-12-17 MED ORDER — FENTANYL CITRATE (PF) 100 MCG/2ML IJ SOLN
25.0000 ug | INTRAMUSCULAR | Status: DC | PRN
  Administered 2017-12-17 – 2017-12-19 (×3): 25 ug via INTRAVENOUS
  Filled 2017-12-17 (×2): qty 2

## 2017-12-17 MED ORDER — POTASSIUM CHLORIDE 20 MEQ/15ML (10%) PO SOLN
40.0000 meq | ORAL | Status: AC
  Administered 2017-12-17 – 2017-12-18 (×2): 40 meq
  Filled 2017-12-17 (×2): qty 30

## 2017-12-17 MED ORDER — OXYCODONE HCL 5 MG PO TABS
10.0000 mg | ORAL_TABLET | Freq: Four times a day (QID) | ORAL | Status: DC | PRN
  Administered 2017-12-17 – 2017-12-18 (×2): 10 mg via ORAL
  Filled 2017-12-17 (×3): qty 2

## 2017-12-17 MED ORDER — SODIUM PHOSPHATES 45 MMOLE/15ML IV SOLN
20.0000 mmol | Freq: Once | INTRAVENOUS | Status: AC
  Administered 2017-12-17: 20 mmol via INTRAVENOUS
  Filled 2017-12-17: qty 6.67

## 2017-12-17 NOTE — Progress Notes (Signed)
   NAME:  Angela Page, MRN:  [id number], DOB:  Sep 18, 1970, LOS: 1 ADMISSION DATE:  12/16/2017, CONSULTATION DATE:  12/16/2017 REFERRING MD:  Dr. Blaine Hamper, CHIEF COMPLAINT:  Acute encephalopathy  Brief History   4 yoF w/hx quadriplegia/trach/ peg/ suprapubic cath from home unresponsive after several episodes of nausea/ vomiting found to have glucose 1637 and hypotension, started on IVF and insulin gtt.   Past Medical History  quadriplegia due to C5-C7 injury from fall with tracheostomy, PEG tube, and suprapubic catheter, tobacco use, asthma, diastolic heart failure, chronic pain, opioid induced constipation, depression, GERD, 13-calorie malnutrition, and substance abuse  Significant Hospital Events   11/27 Admitted  Consults:   Procedures:  Prior Trach, PEG, suprapubic catheter  Significant Diagnostic Tests:  11/27 [facility] >>neg  Micro Data:  11/27 BCx 2>> 11/27 UC >>  Antimicrobials:  levaquin 11/28>>> Interim history/subjective:  C/o pain   Objective   Blood pressure 105/83, pulse (Abnormal) 137, temperature 99.4 F (37.4 C), temperature source Oral, resp. rate (Abnormal) 32, height 4\' 9"  (1.448 m), last menstrual period 05/27/2016, SpO2 94 %.   Intake/Output Summary (Last 24 hours) at 12/17/2017 1627 Last data filed at 12/17/2017 1000 Gross per 24 hour  Intake 1287.83 ml  Output 3460 ml  Net -2172.17 ml   There were no vitals filed for this visit.  Examination: General chronically ill appearing white female currently on full vent support  HENT NCAT cuffed trach unremarkable orally  pulm scattered rhonchi + accessory use  Card tachy abd distended  Ext diffuse anasarca Neuro at baseline   Resolved Hospital Problem list    Assessment & Plan:   Hyperglycemic hyperosmolar nonketotic coma - glucose 1657 -  No prior history of diabetes P:  Cont insulin gtt  Acute on chronic hypotension  - on midodrine - exacerbated by hypovolemia P:  Cont midodrine SBP  goal > 90 [place]  ST ? W/d. Doubt still volume related P Resume home meds  Cont tele   AGMA likely due to lactic acidosis related to hypovolemia/hypotension Pseudohyponatremia- corrects to 151 related to hyperosmolar state P:  Repeat cmp now and in am   AKI P:  Strict I&O Repeat chem in am    Acute on chronic reps failure w/ worsening infiltrates on CXR.  H/o  Asthma, chronic trach suspect aspiration event vs pulm edema  P:  Cont full vent support F/u pending sputum  PEEP titrated up  Sputum culture Empiric levaquin  Trend cbc  Quadriplegia, C5-C7, chronic pain P:  Resume home oxy   Diastolic HF, HLD P:  Hold home lasix, fenofibrate  Opoid induced constipation P:  Cont lactulose, miralax and linzess Hold home lactulose, miralax, linzess   Protein calorie malnutrition P:  Resume tubefeeds.   Best practice:  Diet: tubefeeds  Pain/Anxiety/Delirium protocol (if indicated): n/a VAP protocol (if indicated): n/a DVT prophylaxis: heparin SQ GI prophylaxis: pepcid Glucose control: glucostabilzer  Mobility: BR Code Status: Full Family Communication: Patient's caregiver at bedside updated on plan of care Disposition: ICU    Critical care time: 44 min     Erick Colace ACNP-BC [place] Pager # [phone] OR # [phone] if no answer

## 2017-12-17 NOTE — Progress Notes (Signed)
eLink Physician-Brief Progress Note Patient Name: Clary Boulais DOB: 1970/08/08 MRN: [id number]   Date of Service  12/17/2017  HPI/Events of Note  Notified of glucose 200s now off insulin drip on Lantus 10  eICU Interventions  Ordered to start moderate dose insulin sliding scale     Intervention Category Intermediate Interventions: Hyperglycemia - evaluation and treatment  Shona Needles Dani Danis 12/17/2017, 12:45 AM

## 2017-12-17 NOTE — Progress Notes (Signed)
eLink Physician-Brief Progress Note Patient Name: Ramesha Poster DOB: Jan 03, 1971 MRN: [id number]   Date of Service  12/17/2017  HPI/Events of Note  Patient in respiratory distress.  Reportedly with thick secretions suctioned out. Rhonchi on exam. Cuffless trach. Ambubagging in progress.  eICU Interventions   Plan to change out to cuffed trach and hook to ventilator.  CXR ordered. Unsure if mucus plug or pulmonary edema.  Hold fluids, will give one dose of furosemide 40 mg IV     Intervention Category Major Interventions: Respiratory failure - evaluation and management  Shona Needles Hanny Elsberry 12/17/2017, 2:27 AM

## 2017-12-17 NOTE — Progress Notes (Signed)
Dr. Genevive Bi contacted via [place] staff with request for Glucose Stabilizer to be renewed as current and consecutive CBG's are out of range.

## 2017-12-17 NOTE — Progress Notes (Addendum)
Dr. Genevive Bi contacted via [place] staff with request for a resistant scale SSI coverage, and additional one time novolog coverage per physician's discretion for a CBG of 297.

## 2017-12-17 NOTE — Progress Notes (Signed)
[place] Progress Note Patient Name: Angela Page DOB: 1970-03-07 MRN: [id number]   Date of Service  12/17/2017  HPI/Events of Note  Hypokalemia  eICU Interventions  Potassium replaced     Intervention Category Intermediate Interventions: Electrolyte abnormality - evaluation and management  DETERDING,ELIZABETH 12/17/2017, 11:24 PM

## 2017-12-17 NOTE — Progress Notes (Signed)
Dr. Genevive Bi contacted via [place] staff with request for trach tube change from a cuffless to a cuffed trach to initiate mechanical ventilation.   Pt is becoming labored, desaturating into the lower 80's Sp02 on 100%TC, requiring intermittent ventilatory assistance via ambu bag, secretions yellow thick and moderate, no plugging noted.  Will continue to monitor.

## 2017-12-18 ENCOUNTER — Inpatient Hospital Stay (HOSPITAL_COMMUNITY): Payer: Medicare Other

## 2017-12-18 ENCOUNTER — Encounter (HOSPITAL_COMMUNITY): Payer: Self-pay | Admitting: Radiology

## 2017-12-18 HISTORY — PX: IR PATIENT EVAL TECH 0-60 MINS: IMG5564

## 2017-12-18 LAB — CBC
HCT: 37.8 % (ref 36.0–46.0)
Hemoglobin: 11.4 g/dL — ABNORMAL LOW (ref 12.0–15.0)
MCH: 29.3 pg (ref 26.0–34.0)
MCHC: 30.2 g/dL (ref 30.0–36.0)
MCV: 97.2 fL (ref 80.0–100.0)
Platelets: 136 10*3/uL — ABNORMAL LOW (ref 150–400)
RBC: 3.89 MIL/uL (ref 3.87–5.11)
RDW: 14.9 % (ref 11.5–15.5)
WBC: 18.2 10*3/uL — ABNORMAL HIGH (ref 4.0–10.5)
nRBC: 0.2 % (ref 0.0–0.2)

## 2017-12-18 LAB — GLUCOSE, CAPILLARY
GLUCOSE-CAPILLARY: 143 mg/dL — AB (ref 70–99)
GLUCOSE-CAPILLARY: 101 mg/dL — AB (ref 70–99)
GLUCOSE-CAPILLARY: 79 mg/dL (ref 70–99)
GLUCOSE-CAPILLARY: 383 mg/dL — AB (ref 70–99)
Glucose-Capillary: 115 mg/dL — ABNORMAL HIGH (ref 70–99)
Glucose-Capillary: 128 mg/dL — ABNORMAL HIGH (ref 70–99)
Glucose-Capillary: 134 mg/dL — ABNORMAL HIGH (ref 70–99)
Glucose-Capillary: 148 mg/dL — ABNORMAL HIGH (ref 70–99)
Glucose-Capillary: 156 mg/dL — ABNORMAL HIGH (ref 70–99)
Glucose-Capillary: 157 mg/dL — ABNORMAL HIGH (ref 70–99)
Glucose-Capillary: 157 mg/dL — ABNORMAL HIGH (ref 70–99)
Glucose-Capillary: 159 mg/dL — ABNORMAL HIGH (ref 70–99)
Glucose-Capillary: 164 mg/dL — ABNORMAL HIGH (ref 70–99)
Glucose-Capillary: 167 mg/dL — ABNORMAL HIGH (ref 70–99)
Glucose-Capillary: 168 mg/dL — ABNORMAL HIGH (ref 70–99)
Glucose-Capillary: 211 mg/dL — ABNORMAL HIGH (ref 70–99)
Glucose-Capillary: 213 mg/dL — ABNORMAL HIGH (ref 70–99)
Glucose-Capillary: 238 mg/dL — ABNORMAL HIGH (ref 70–99)
Glucose-Capillary: 242 mg/dL — ABNORMAL HIGH (ref 70–99)
Glucose-Capillary: 243 mg/dL — ABNORMAL HIGH (ref 70–99)
Glucose-Capillary: 265 mg/dL — ABNORMAL HIGH (ref 70–99)
Glucose-Capillary: 275 mg/dL — ABNORMAL HIGH (ref 70–99)
Glucose-Capillary: 92 mg/dL (ref 70–99)
Glucose-Capillary: 92 mg/dL (ref 70–99)
Glucose-Capillary: 75 mg/dL (ref 70–99)
Glucose-Capillary: 92 mg/dL (ref 70–99)
Glucose-Capillary: 63 mg/dL — ABNORMAL LOW (ref 70–99)
Glucose-Capillary: 170 mg/dL — ABNORMAL HIGH (ref 70–99)
Glucose-Capillary: 150 mg/dL — ABNORMAL HIGH (ref 70–99)
Glucose-Capillary: 332 mg/dL — ABNORMAL HIGH (ref 70–99)
Glucose-Capillary: 378 mg/dL — ABNORMAL HIGH (ref 70–99)
Glucose-Capillary: 362 mg/dL — ABNORMAL HIGH (ref 70–99)
Glucose-Capillary: 303 mg/dL — ABNORMAL HIGH (ref 70–99)
Glucose-Capillary: 305 mg/dL — ABNORMAL HIGH (ref 70–99)
Glucose-Capillary: 309 mg/dL — ABNORMAL HIGH (ref 70–99)

## 2017-12-18 LAB — MAGNESIUM
MAGNESIUM: 2.1 mg/dL (ref 1.7–2.4)
Magnesium: 2 mg/dL (ref 1.7–2.4)

## 2017-12-18 LAB — BASIC METABOLIC PANEL
Anion gap: 12 (ref 5–15)
BUN: 21 mg/dL — ABNORMAL HIGH (ref 6–20)
CO2: 19 mmol/L — ABNORMAL LOW (ref 22–32)
Calcium: 8.3 mg/dL — ABNORMAL LOW (ref 8.9–10.3)
Chloride: 114 mmol/L — ABNORMAL HIGH (ref 98–111)
Creatinine, Ser: 1.16 mg/dL — ABNORMAL HIGH (ref 0.44–1.00)
GFR calc Af Amer: >60 mL/min (ref >60)
GFR, EST NON AFRICAN AMERICAN: 56 mL/min — AB (ref >60)
Glucose, Bld: 108 mg/dL — ABNORMAL HIGH (ref 70–99)
Potassium: 4.2 mmol/L (ref 3.5–5.1)
SODIUM: 145 mmol/L (ref 135–145)

## 2017-12-18 LAB — POCT I-STAT 3, ART BLOOD GAS (G3+)
Acid-base deficit: 3 mmol/L — ABNORMAL HIGH (ref 0.0–2.0)
Bicarbonate: 23.7 mmol/L (ref 20.0–28.0)
O2 Saturation: 91 %
Patient temperature: 107
TCO2: 25 mmol/L (ref 22–32)
pCO2 arterial: 57.9 mmHg — ABNORMAL HIGH (ref 32.0–48.0)
pH, Arterial: 7.243 — ABNORMAL LOW (ref 7.350–7.450)
pO2, Arterial: 93 mmHg (ref 83.0–108.0)

## 2017-12-18 LAB — PHOSPHORUS
PHOSPHORUS: 2.7 mg/dL (ref 2.5–4.6)
Phosphorus: 3.9 mg/dL (ref 2.5–4.6)

## 2017-12-18 LAB — LUTEINIZING HORMONE: LH: 0.3 m[IU]/mL

## 2017-12-18 LAB — FOLLICLE STIMULATING HORMONE: FSH: 5.3 m[IU]/mL

## 2017-12-18 IMAGING — DX DG CHEST 1V PORT
1 series · 1 of 1 positions shown · non-contrast
Comparison: Chest radiograph dated 11/13/2015

CLINICAL DATA: 45-year-old female with pneumonia.

EXAM:
PORTABLE CHEST 1 VIEW

[chest ap]
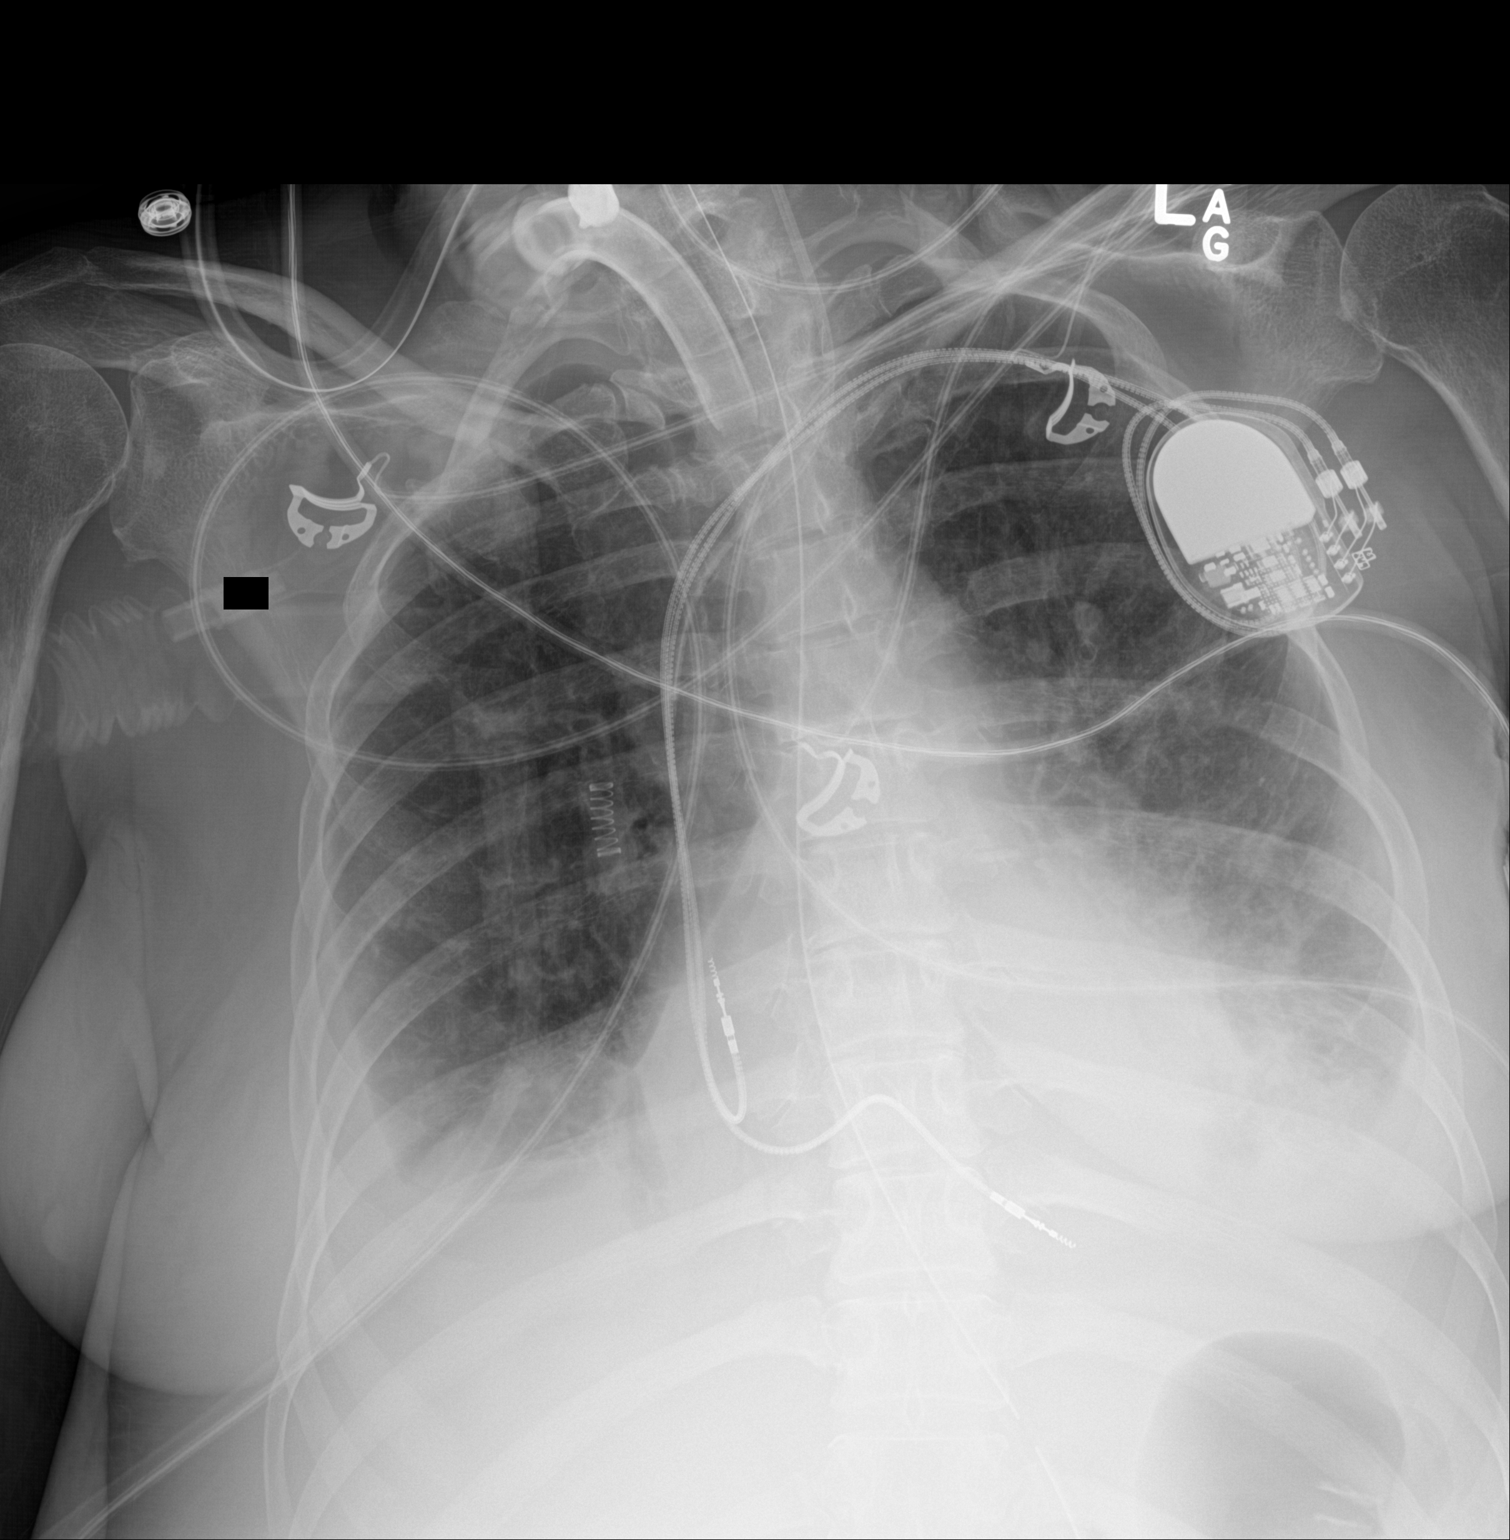

[1 of 1 positions shown; findings below may reference images not displayed]

FINDINGS: Tracheostomy with tip above the carina in stable positioning.
Enteric tube courses into the left hemi abdomen with tip side-port
below the diaphragm and tip beyond the inferior margin of the image.

No significant interval change in the small bilateral pleural
effusions with bibasilar hazy airspace densities. There is no
pneumothorax. Stable cardiac silhouette. Left pectoral pacemaker
device. Partially visualized cervical fusion hardware. No acute
fracture.
IMPRESSION: No significant interval change in the small bilateral pleural
effusions and lower lobe airspace densities.

Support line and tube in stable positioning.

## 2017-12-18 MED ORDER — SODIUM CHLORIDE 0.9 % IV SOLN
INTRAVENOUS | Status: DC | PRN
  Administered 2017-12-18: 1000 mL via INTRAVENOUS
  Administered 2017-12-18 – 2017-12-21 (×2): 250 mL via INTRAVENOUS
  Administered 2017-12-22: 500 mL via INTRAVENOUS

## 2017-12-18 MED ORDER — IBUPROFEN 100 MG/5ML PO SUSP
400.0000 mg | Freq: Four times a day (QID) | ORAL | Status: DC | PRN
  Administered 2017-12-20 – 2017-12-25 (×3): 400 mg
  Filled 2017-12-18 (×4): qty 20

## 2017-12-18 MED ORDER — INSULIN ASPART 100 UNIT/ML ~~LOC~~ SOLN
1.0000 [IU] | SUBCUTANEOUS | Status: DC
  Administered 2017-12-18: 3 [IU] via SUBCUTANEOUS

## 2017-12-18 MED ORDER — INSULIN GLARGINE 100 UNIT/ML ~~LOC~~ SOLN
30.0000 [IU] | Freq: Every day | SUBCUTANEOUS | Status: DC
  Administered 2017-12-18: 30 [IU] via SUBCUTANEOUS
  Filled 2017-12-18 (×2): qty 0.3

## 2017-12-18 MED ORDER — OSMOLITE 1.2 CAL PO LIQD
1000.0000 mL | ORAL | Status: DC
  Administered 2017-12-18 – 2017-12-22 (×10): 1000 mL
  Filled 2017-12-18 (×11): qty 1000

## 2017-12-18 MED ORDER — FREE WATER
100.0000 mL | Freq: Three times a day (TID) | Status: DC
  Administered 2017-12-18 – 2017-12-20 (×5): 100 mL

## 2017-12-18 MED ORDER — LEVOFLOXACIN 25 MG/ML PO SOLN
500.0000 mg | Freq: Every day | ORAL | Status: DC
  Administered 2017-12-18: 500 mg via ORAL
  Filled 2017-12-18 (×2): qty 20

## 2017-12-18 MED ORDER — ACETAMINOPHEN 325 MG PO TABS
650.0000 mg | ORAL_TABLET | Freq: Four times a day (QID) | ORAL | Status: DC | PRN
  Administered 2017-12-18: 650 mg
  Filled 2017-12-18: qty 2

## 2017-12-18 MED ORDER — SODIUM CHLORIDE 0.9 % IV BOLUS
500.0000 mL | Freq: Once | INTRAVENOUS | Status: AC
  Administered 2017-12-18: 500 mL via INTRAVENOUS

## 2017-12-18 MED ORDER — INSULIN ASPART 100 UNIT/ML ~~LOC~~ SOLN
3.0000 [IU] | SUBCUTANEOUS | Status: DC
  Administered 2017-12-18: 9 [IU] via SUBCUTANEOUS

## 2017-12-18 MED ORDER — INSULIN REGULAR(HUMAN) IN NACL 100-0.9 UT/100ML-% IV SOLN
INTRAVENOUS | Status: DC
  Administered 2017-12-18: 2.4 [IU]/h via INTRAVENOUS
  Filled 2017-12-18 (×3): qty 100

## 2017-12-18 NOTE — Progress Notes (Signed)
Inpatient Diabetes Program Recommendations  AACE/ADA: New Consensus Statement on Inpatient Glycemic Control (2015)  Target Ranges:  Prepandial:   less than 140 mg/dL      Peak postprandial:   less than 180 mg/dL (1-2 hours)      Critically ill patients:  140 - 180 mg/dL   Lab Results  Component Value Date   GLUCAP 150 (H) 12/18/2017   HGBA1C 11.5 (H) 12/17/2017    Review of Glycemic ControlResults for Angela Page, Angela Page (MRN [id number]) as of 12/18/2017 12:44  Ref. Range 12/18/2017 04:09 12/18/2017 05:10 12/18/2017 06:03 12/18/2017 07:09 12/18/2017 08:00 12/18/2017 09:26 12/18/2017 10:40 12/18/2017 11:20  Glucose-Capillary Latest Ref Range: 70 - 99 mg/dL 101 (H) 92 75 79 92 63 (L) 170 (H) 150 (H)    Diabetes history: None-last documented lab glucose in June of 2019 was 138 mg/dL Outpatient Diabetes medications:  None Current orders for Inpatient glycemic control:  IV insulin Inpatient Diabetes Program Recommendations:    Note patient currently on insulin drip.  Spoke to RN, may consider transition off insulin drip today?   A1C does indicate elevated blood sugars, however unsure how much the past week of elevated CBG's have affected A1C.    Consider Levemir 8 units bid (give Levemir 2 hours prior to d/c of insulin drip), Novolog sensitive correction q 4 hours.   If blood sugars trend up >180 mg/dL, consider adding Novolog tube feed coverage 2 units q 4 hours.    Thanks,  Adah Perl, RN, BC-ADM Inpatient Diabetes Coordinator Pager [phone] (8a-5p)

## 2017-12-18 NOTE — Progress Notes (Signed)
NAME:  Angela Page, MRN:  [id number], DOB:  [birth year], LOS: 2 ADMISSION DATE:  12/16/2017, CONSULTATION DATE:  12/16/2017 REFERRING MD:  Dr. Blaine Hamper, CHIEF COMPLAINT:  Acute encephalopathy  Brief History   81 yoF w/hx quadriplegia/trach/ peg/ suprapubic cath from home unresponsive after several episodes of nausea/ vomiting found to have glucose 1637 and hypotension, started on IVF and insulin gtt.   Past Medical History  quadriplegia due to C5-C7 injury from fall with tracheostomy, PEG tube, and suprapubic catheter, tobacco use, asthma, diastolic heart failure, chronic pain, opioid induced constipation, depression, GERD, 13-calorie malnutrition, and substance abuse  Significant Hospital Events   11/27 Admitted  Consults:   Procedures:  Prior Trach, PEG, suprapubic catheter  Significant Diagnostic Tests:  11/27 [facility] >>neg  Micro Data:  11/27 BCx 2>> 11/27 UC >>  Antimicrobials:  levaquin 11/28>>> Interim history/subjective:  No pain. HR elevated, not yet transitioned to  insulin. IR reports PEG works fine.  Requiring less suctioning.  Febrile to 107.   Objective   Blood pressure (!) 81/68, pulse 67, temperature 97.7 F (36.5 C), temperature source Oral, resp. rate (!) 21, height 4\' 9"  (1.448 m), weight 56.4 kg, last menstrual period 05/27/2016, SpO2 100 %.   Intake/Output Summary (Last 24 hours) at 12/18/2017 1247 Last data filed at 12/18/2017 1200 Gross per 24 hour  Intake 1970.14 ml  Output 645 ml  Net 1325.14 ml   Filed Weights   12/18/17 0500  Weight: 56.4 kg    Examination: General chronically ill appearing white female currently on full vent support  HENT NCAT cuffed trach unremarkable orally , oral membranes dry. pulm scattered rhonchi + accessory use  Card tachy abd no longer distended PEG site intact Ext diffuse anasarca Neuro at baseline interactive  Resolved Hospital Problem list    Assessment & Plan:   Hyperglycemic hyperosmolar  nonketotic coma - glucose 1657 initially - now 150  -  No prior history of diabetes - possible first presentation of type 2 DM P:  Transition to subcutaneous basal bolus insulin.  Acute on chronic hypotension  - on midodrine - exacerbated by hypovolemia P:  Cont midodrine SBP goal > 90 KVO IVFs  Sinus tachycardia related to infection and fevers. P Resume home meds  Cont tele   AGMA likely due to lactic acidosis related to hypovolemia/hypotension Pseudohyponatremia- corrects to 151 related to hyperosmolar state P:  Repeat cmp now and in am   AKI - mild  P:  Strict I&O Continue to follow Fluid challenge - high insensitive losses due to fever.   Acute on chronic reps failure w/ worsening infiltrates on CXR.  H/o  Asthma, chronic trach suspect aspiration event  P:  Cont full vent support F/u pending sputum  PEEP titrated up  Sputum culture Empiric levaquin  Trend cbc  Severe pyrexia associated with infection - impaired temperature regulation due to quadriplegia. Acetaminophen and ibuprofen for fever.   Quadriplegia, C5-C7, chronic pain P:  Resume home oxy.  Diastolic HF, HLD P:  Hold home lasix, fenofibrate  Opoid induced constipation P:  Hold home lactulose, miralax, linzess - because of diarrhea.  Protein calorie malnutrition P:  Resume tube feeds.   Best practice:  Diet: tubefeeds  Pain/Anxiety/Delirium protocol (if indicated): n/a VAP protocol (if indicated): n/a DVT prophylaxis: heparin SQ GI prophylaxis: pepcid Glucose control: glucostabilzer  Mobility: BR Code Status: Full Family Communication: Patient's caregiver at bedside updated on plan of care Disposition: ICU due to chronic ventilator  Kipp Brood, MD  [facility] ICU Physician [place]  Pager: [phone] Mobile: [phone] After hours: [phone].

## 2017-12-18 NOTE — Progress Notes (Signed)
Lab called at 2100 and was concerned about clotting in the AM labs strongly suggested repeat labs. Elink notified and labs ordered for AM.

## 2017-12-18 NOTE — Progress Notes (Signed)
Notifed [place] nurse of pt rectal temperature of 107 and BP with maps of 55-60. Saline Bolus of 500 given to pt and ABG ordered. Will continue to monitor

## 2017-12-18 NOTE — Progress Notes (Signed)
Initial Nutrition Assessment  DOCUMENTATION CODES:   Not applicable  INTERVENTION:  - Will adjust TF regimen: Osmolite 1.2 @ 40 mL/hr to advance by 10 mL every 4 hours to reach goal rate of Osmolite 1.2 @ 70 mL/hr. - Goal rate will provide 2016 kcal (98% estimated kcal need), 93 mL free water, and 1378 mL free water.  - Will order 100 mL free water TID. - Slower advancement given difficulty with tube this AM and to be able to adjust for CBGs, if needed.    NUTRITION DIAGNOSIS:   Inadequate oral intake related to inability to eat as evidenced by NPO status.  GOAL:   Patient will meet greater than or equal to 90% of their needs  MONITOR:   Vent status, TF tolerance, Weight trends, Labs  REASON FOR ASSESSMENT:   Ventilator, Consult Enteral/tube feeding initiation and management  ASSESSMENT:   47 year-old with hx of quadriplegia, trach, PEG, and suprapubic cath, tobacco use, asthma, CHF, chronic pain with associated opioid induced constipation, depression, GERD, calorie malnutrition, and substance abuse. She was found unresponsive at home after several episodes of nausea/ vomiting found to have glucose 1637 and hypotension, started on IVF and insulin gtt.  BMI indicates overweight. Patient with trach and PEG. She is receiving TF per order of TF Protocol: Vital High Protein @ 40 mL/hr with 30 mL Prostat BID. This regimen provides 1160 kcal, 114 grams of protein, and 802 mL free water. No family/visitors at bedside. Patient able to nod/shake head. She indicates that she is having abdominal pain and nausea; alerted RN. RN reports that patient becomes nauseated and may have vomiting with turning and being laid flat. She reports patient vomited overnight x1 after being laid flat.  RN denies any issues with TF otherwise or with tube. She reports that PEG is planned to be changed soon. As RD was leaving the room, RN was attempting to flush PEG and was having extreme difficulty.   Current  weight is 124 lb. Per chart review, weight on 10/30 was 130 lb. This indicates 6 lb weight loss (4.6% body weight) in the past 1 month. Per chart review, patient receives Osmolite 1.2 @ 100 mL/hr x10 hours (2300-0900) with 30 mL ProMod BID at home.   Patient is currently intubated on ventilator support MV: 9 L/min Temp (24hrs), Avg:101.3 F (38.5 C), Min:97.8 F (36.6 C), Max:107 F (41.7 C) Propofol: none BP:87/70 and MAP: 77  Medications reviewed; 20 mg IV Pepcid BID, 30 g lactulose per PEG/day, 40 mEq KCl per PEG x2 doses 11/28, 20 mmol IV NaPhos x1 run 11/28. Labs reviewed; CBGs: 75-275 mg/dL since 0014.     NUTRITION - FOCUSED PHYSICAL EXAM:  Completed; no muscle and no fat wasting, noted mild generalized edema.  Diet Order:   Diet Order            Diet NPO time specified  Diet effective now              EDUCATION NEEDS:   No education needs have been identified at this time  Skin:  Skin Assessment: Reviewed RN Assessment  Last BM:  11/29  Height:   Ht Readings from Last 1 Encounters:  12/17/17 4\' 9"  (1.448 m)    Weight:   Wt Readings from Last 1 Encounters:  12/18/17 56.4 kg    Ideal Body Weight:  41.45 kg  BMI:  Body mass index is 26.91 kg/m.  Estimated Nutritional Needs:   Kcal:  2064 kcal  Protein:  85-96 grams (1.5-1.7 grams/kg)  Fluid:  >/= 1.8 L/day      Jarome Matin, MS, RD, LDN, [facility] Inpatient Clinical Dietitian Pager # [phone] After hours/weekend pager # [phone]

## 2017-12-18 NOTE — Progress Notes (Signed)
Angela Page notified that pt vomited when placed flat to turn. Instructed to hold tube feedings for 2 hours then resume.

## 2017-12-18 NOTE — Progress Notes (Signed)
[place] Progress Note Patient Name: Angela Page DOB: 04/08/1970 MRN: [id number]   Date of Service  12/18/2017  HPI/Events of Note  Fluid bolus given with some improvement in HR but remains hypotensive.  Also with resp acidosis on ABG with pH 7.24/58/93/25.  eICU Interventions  Plan: Repeat fluid bolus 500 cc x 1 Vent adjustments made to increase TV and RR Recheck ABG in AM Tylenol given earlier for fever along with cooling blanket     Intervention Category Major Interventions: Acid-Base disturbance - evaluation and management;Hypotension - evaluation and management  DETERDING,ELIZABETH 12/18/2017, 3:44 AM

## 2017-12-18 NOTE — Procedures (Signed)
Went bedside to unclog Gastrostomy tube as requested by physician, arrived at bedside and patient was connected to tube feeds with no issues.  Flushed tube with 50 cc of normal saline with no complications.  Spoke with RN and she stated tube flushed this morning and the tube feeds were running with no complications. Spoke with Dr. Annamaria Boots and decided to leave tube in place.    France Ravens RT R, VI

## 2017-12-19 HISTORY — PX: IR REPLACE G-TUBE SIMPLE WO FLUORO: IMG2323

## 2017-12-19 LAB — CBC
HCT: 31.9 % — ABNORMAL LOW (ref 36.0–46.0)
Hemoglobin: 9.9 g/dL — ABNORMAL LOW (ref 12.0–15.0)
MCH: 29.4 pg (ref 26.0–34.0)
MCHC: 31 g/dL (ref 30.0–36.0)
MCV: 94.7 fL (ref 80.0–100.0)
Platelets: 116 10*3/uL — ABNORMAL LOW (ref 150–400)
RBC: 3.37 MIL/uL — ABNORMAL LOW (ref 3.87–5.11)
RDW: 14.9 % (ref 11.5–15.5)
WBC: 25.5 10*3/uL — ABNORMAL HIGH (ref 4.0–10.5)
nRBC: 0.1 % (ref 0.0–0.2)

## 2017-12-19 LAB — CULTURE, RESPIRATORY

## 2017-12-19 LAB — GLUCOSE, CAPILLARY
GLUCOSE-CAPILLARY: 137 mg/dL — AB (ref 70–99)
GLUCOSE-CAPILLARY: 285 mg/dL — AB (ref 70–99)
Glucose-Capillary: 103 mg/dL — ABNORMAL HIGH (ref 70–99)
Glucose-Capillary: 120 mg/dL — ABNORMAL HIGH (ref 70–99)
Glucose-Capillary: 162 mg/dL — ABNORMAL HIGH (ref 70–99)
Glucose-Capillary: 166 mg/dL — ABNORMAL HIGH (ref 70–99)
Glucose-Capillary: 191 mg/dL — ABNORMAL HIGH (ref 70–99)
Glucose-Capillary: 237 mg/dL — ABNORMAL HIGH (ref 70–99)
Glucose-Capillary: 252 mg/dL — ABNORMAL HIGH (ref 70–99)
Glucose-Capillary: 280 mg/dL — ABNORMAL HIGH (ref 70–99)
Glucose-Capillary: 77 mg/dL (ref 70–99)

## 2017-12-19 LAB — CULTURE, RESPIRATORY W GRAM STAIN

## 2017-12-19 LAB — PHOSPHORUS: PHOSPHORUS: 2.7 mg/dL (ref 2.5–4.6)

## 2017-12-19 LAB — MAGNESIUM: Magnesium: 2.1 mg/dL (ref 1.7–2.4)

## 2017-12-19 MED ORDER — FAMOTIDINE IN NACL 20-0.9 MG/50ML-% IV SOLN
20.0000 mg | INTRAVENOUS | Status: DC
  Administered 2017-12-19 – 2017-12-20 (×2): 20 mg via INTRAVENOUS
  Filled 2017-12-19 (×2): qty 50

## 2017-12-19 MED ORDER — SODIUM CHLORIDE 0.9 % IV SOLN
1.0000 g | Freq: Two times a day (BID) | INTRAVENOUS | Status: AC
  Administered 2017-12-19 – 2017-12-25 (×14): 1 g via INTRAVENOUS
  Filled 2017-12-19 (×14): qty 1

## 2017-12-19 MED ORDER — VANCOMYCIN HCL IN DEXTROSE 1-5 GM/200ML-% IV SOLN
1000.0000 mg | Freq: Once | INTRAVENOUS | Status: AC
  Administered 2017-12-19: 1000 mg via INTRAVENOUS
  Filled 2017-12-19: qty 200

## 2017-12-19 MED ORDER — INSULIN DETEMIR 100 UNIT/ML ~~LOC~~ SOLN
8.0000 [IU] | Freq: Two times a day (BID) | SUBCUTANEOUS | Status: DC
  Administered 2017-12-19 – 2017-12-20 (×3): 8 [IU] via SUBCUTANEOUS
  Filled 2017-12-19 (×3): qty 0.08

## 2017-12-19 MED ORDER — VANCOMYCIN HCL 500 MG IV SOLR
500.0000 mg | Freq: Two times a day (BID) | INTRAVENOUS | Status: DC
  Filled 2017-12-19: qty 500

## 2017-12-19 MED ORDER — FENTANYL CITRATE (PF) 100 MCG/2ML IJ SOLN
25.0000 ug | INTRAMUSCULAR | Status: DC | PRN
  Administered 2017-12-19 – 2017-12-29 (×14): 25 ug via INTRAVENOUS
  Filled 2017-12-19 (×15): qty 2

## 2017-12-19 MED ORDER — LACTATED RINGERS IV SOLN
INTRAVENOUS | Status: DC
  Administered 2017-12-19 – 2017-12-21 (×5): via INTRAVENOUS

## 2017-12-19 MED ORDER — INSULIN ASPART 100 UNIT/ML ~~LOC~~ SOLN
5.0000 [IU] | SUBCUTANEOUS | Status: DC
  Administered 2017-12-19 – 2017-12-20 (×5): 5 [IU] via SUBCUTANEOUS

## 2017-12-19 MED ORDER — INSULIN ASPART 100 UNIT/ML ~~LOC~~ SOLN
0.0000 [IU] | SUBCUTANEOUS | Status: DC
  Administered 2017-12-19 (×2): 8 [IU] via SUBCUTANEOUS
  Administered 2017-12-19: 3 [IU] via SUBCUTANEOUS
  Administered 2017-12-20: 11 [IU] via SUBCUTANEOUS
  Administered 2017-12-20 (×2): 3 [IU] via SUBCUTANEOUS
  Administered 2017-12-20: 11 [IU] via SUBCUTANEOUS
  Administered 2017-12-20: 8 [IU] via SUBCUTANEOUS

## 2017-12-19 NOTE — Procedures (Signed)
PROCEDURE SUMMARY:  Successful bedside replacement of leaking gastrostomy tube with a new 20 french balloon retention G-tube.  Gastric contents were aspirated to ensure placement in the stomach.  Tube was flushed with water.   No immediate complications.   Patient tolerated well.   EBL < 2 mL  Cataleya Cristina S Zadie Deemer PA-C 12/19/2017 9:27 AM

## 2017-12-19 NOTE — Progress Notes (Signed)
Pharmacy Antibiotic Note  Angela Page is a 47 y.o. female admitted on 12/16/2017 with AMS.  Pharmacy has been consulted for vancomycin and meropenem dosing for pneumonia. Pt is afebrile and WBC is elevated at 25.5. SCr is WNL but slightly bumped up.   Plan: Vancomycin 1gm IV x 1 then 500mg  IV Q12H Meropenem 1gm IV Q12H F/u renal fxn, C&S, clinical status and trough at SS  Height: 4\' 9"  (144.8 cm) Weight: 126 lb 5.2 oz (57.3 kg) IBW/kg (Calculated) : 38.6  Temp (24hrs), Avg:98.2 F (36.8 C), Min:96.8 F (36 C), Max:99.5 F (37.5 C)  Recent Labs  Lab 12/16/17 0307 12/16/17 0313  12/16/17 0521 12/16/17 0744 12/16/17 1052 12/16/17 1258 12/16/17 1951 12/17/17 0353 12/17/17 2053 12/18/17 0824 12/19/17 0623  WBC 23.9*  --   --   --   --   --   --   --  22.9*  --  18.2* 25.5*  CREATININE 1.81*  --    < >  --  1.13*  --  1.03* 0.79  --  0.94 1.16*  --   LATICACIDVEN  --  3.99*  --  3.51* 10.3* 7.1*  --   --   --   --   --   --    < > = values in this interval not displayed.    Estimated Creatinine Clearance: 43.6 mL/min (A) (by C-G formula based on SCr of 1.16 mg/dL (H)).    Allergies  Allergen Reactions  . Erythromycin Nausea And Vomiting  . Nitrofurantoin Monohyd Macro Nausea And Vomiting  . Oxybutynin     Dries mouth out   . Penicillins Hives    Has patient had a PCN reaction causing immediate rash, facial/tongue/throat swelling, SOB or lightheadedness with hypotension: Yes Has patient had a PCN reaction causing severe rash involving mucus membranes or skin necrosis: Yes Has patient had a PCN reaction that required hospitalization No Has patient had a PCN reaction occurring within the last 10 years: No-childhood allergy If all of the above answers are "NO", then may proceed with Cephalosporin     Antimicrobials this admission: Meropenem 11/30>> Vanc 11/30>> Ceftriaxone 11/27>> 11/29 Levaquin 1128 >>11/30  Dose adjustments this  admission: N/A  Microbiology results: 11/27 blood x 2: ngtd 11/27 urine: 80 k yeast 11/27 resp: abundant SA, acinetobacter  Thank you for allowing pharmacy to be a part of this patient's care.  Jourdain Guay, Rande Lawman 12/19/2017 8:48 AM

## 2017-12-19 NOTE — Progress Notes (Signed)
[facility] notified of Peg tube leaking. Tube feeds stopped and insulin drip stopped. Fentanyl frequency increased for pain control.

## 2017-12-19 NOTE — Progress Notes (Signed)
NAME:  Angela Page, MRN:  [id number], DOB:  Jan 24, 1970, LOS: 3 ADMISSION DATE:  12/16/2017, CONSULTATION DATE:  12/16/2017 REFERRING MD:  Dr. Blaine Hamper, CHIEF COMPLAINT:  Acute encephalopathy  Brief History   89 yoF w/hx quadriplegia/trach/ peg/ suprapubic cath from home unresponsive after several episodes of nausea/ vomiting found to have glucose 1637 and hypotension, started on IVF and insulin gtt.   Past Medical History  quadriplegia due to C5-C7 injury from fall with tracheostomy, PEG tube, and suprapubic catheter, tobacco use, asthma, diastolic heart failure, chronic pain, opioid induced constipation, depression, GERD, 13-calorie malnutrition, and substance abuse  Significant Hospital Events   11/27 Admitted  Consults:   Procedures:  Prior Trach, PEG, suprapubic catheter  Significant Diagnostic Tests:  11/27 [facility] >>neg  Micro Data:  11/27 BCx 2>> 11/27 UC >> 11/28: Sputum: Abundant staph aureus, abundant Acinetobacter Antimicrobials:  levaquin 11/28>>> 11/30 11/30 meropenem 11/30 vancomycin  Interim history/subjective:  Still spiking fever, otherwise no distress  Objective   Blood pressure (!) 81/68, pulse 67, temperature 97.7 F (36.5 C), temperature source Oral, resp. rate (!) 21, height 4\' 9"  (1.448 m), weight 56.4 kg, last menstrual period 05/27/2016, SpO2 100 %.   Intake/Output Summary (Last 24 hours) at 12/19/2017 0757 Last data filed at 12/19/2017 0600 Gross per 24 hour  Intake 1056.04 ml  Output 1370 ml  Net -313.96 ml   Filed Weights   12/18/17 0500 12/19/17 0500  Weight: 56.4 kg 57.3 kg    Examination:  General: 47 year old chronically ill-appearing female currently on full ventilator support she is flushed, but in no distress HEENT normocephalic atraumatic tracheostomy unremarkable Pulmonary: Coarse scattered rhonchi some accessory use denies dyspnea Cardiac: Tachycardic regular rate and rhythm Abdomen: Soft, PEG tube has ruptured.  Bowel  sounds positive.  No pain.  Suprapubic cath unremarkable Extremities: Diffuse anasarca strong pulses Neuro: Awake, oriented, interactive.  Unable to move extremities, has baseline quadriplegia  Resolved Hospital Problem list   Anion gap metabolic acidosis  Hyponatremia/pseudohyponatremia d/t hyperglycemia  Hyperglycemic Hyperosmolar Nonketotic Coma: - glucose 1657 initially - now 150 -  No prior history of diabetes - possible first presentation of type 2 DM  Assessment & Plan:   Acute on chronic reps failure d/t altered mental status initially then c/b aspiration PNA (acinetobacter and abundant staph) H/o  COPD, on-going tobacco abuse, chronic trach -PCXR persistent R>L airspace disease. The Right looks a little worse than left & when comparing serial films this looks a little worse  -still febrile. Temp rising P:  Change abx to meropenem and vanc  Cont full vent support (39ml/kg) and daily assessment for weaning Am CXR Cont VAP bundle Cont BDs  DM Type II w/ hyperglycemia ->glycemic control remains challenging P:  levemir 8 units q 12 SSI mod scale Add tubefeed coverage once tube feeds resumed.   Acute on chronic hypotension  - on midodrine - exacerbated by hypovolemia P:  Cont midodrine and IVFs  Sinus tachycardia related to infection and fevers. P Cont supportive care   AKI - mild  P:  Repeat AM chem  Cont gentle hydration (has had insensible loss from fevers)  Severe pyrexia associated with infection - impaired temperature regulation due to quadriplegia. P: Cont PRN tylenol and ibuprofen   Quadriplegia, C5-C7, chronic pain P:  PRN fent in place oxy   Diastolic HF, HLD P:  Holding lasix and fenofibrate   Opoid induced constipation->diarrhea as of 11/29 P:  Holding lactulose, miralax and linzess d/t diarrhea.  Protein calorie malnutrition P:  Resume tube feeds once we re-establish feeding route. IF can't be done over weekend we will place temp small  bore NGT  Best practice:  Diet: tubefeeds  Pain/Anxiety/Delirium protocol (if indicated): n/a VAP protocol (if indicated): n/a DVT prophylaxis: heparin SQ GI prophylaxis: pepcid Glucose control: glucostabilzer  Mobility: BR Code Status: Full Family Communication: Patient's caregiver at bedside updated on plan of care Disposition: ICU due to chronic ventilator   Erick Colace ACNP-BC [place] Pager # [phone] OR # [phone] if no answer

## 2017-12-20 ENCOUNTER — Inpatient Hospital Stay (HOSPITAL_COMMUNITY): Payer: Medicare Other

## 2017-12-20 ENCOUNTER — Encounter (HOSPITAL_COMMUNITY): Payer: Self-pay | Admitting: Physician Assistant

## 2017-12-20 DIAGNOSIS — J449 Chronic obstructive pulmonary disease, unspecified: Secondary | ICD-10-CM

## 2017-12-20 DIAGNOSIS — J9601 Acute respiratory failure with hypoxia: Secondary | ICD-10-CM

## 2017-12-20 DIAGNOSIS — Z930 Tracheostomy status: Secondary | ICD-10-CM

## 2017-12-20 LAB — GLUCOSE, CAPILLARY
GLUCOSE-CAPILLARY: 191 mg/dL — AB (ref 70–99)
Glucose-Capillary: 200 mg/dL — ABNORMAL HIGH (ref 70–99)
Glucose-Capillary: 237 mg/dL — ABNORMAL HIGH (ref 70–99)
Glucose-Capillary: 310 mg/dL — ABNORMAL HIGH (ref 70–99)
Glucose-Capillary: 342 mg/dL — ABNORMAL HIGH (ref 70–99)
Glucose-Capillary: 348 mg/dL — ABNORMAL HIGH (ref 70–99)

## 2017-12-20 LAB — CBC
HCT: 38.3 % (ref 36.0–46.0)
Hemoglobin: 11.4 g/dL — ABNORMAL LOW (ref 12.0–15.0)
MCH: 28.6 pg (ref 26.0–34.0)
MCHC: 29.8 g/dL — ABNORMAL LOW (ref 30.0–36.0)
MCV: 96.2 fL (ref 80.0–100.0)
Platelets: 180 10*3/uL (ref 150–400)
RBC: 3.98 MIL/uL (ref 3.87–5.11)
RDW: 15.6 % — ABNORMAL HIGH (ref 11.5–15.5)
WBC: 36.7 10*3/uL — ABNORMAL HIGH (ref 4.0–10.5)
nRBC: 0.2 % (ref 0.0–0.2)

## 2017-12-20 LAB — BASIC METABOLIC PANEL
Anion gap: 9 (ref 5–15)
BUN: 33 mg/dL — ABNORMAL HIGH (ref 6–20)
CALCIUM: 8.7 mg/dL — AB (ref 8.9–10.3)
CO2: 16 mmol/L — ABNORMAL LOW (ref 22–32)
Chloride: 122 mmol/L — ABNORMAL HIGH (ref 98–111)
Creatinine, Ser: 1.49 mg/dL — ABNORMAL HIGH (ref 0.44–1.00)
GFR calc Af Amer: 48 mL/min — ABNORMAL LOW (ref >60)
GFR, EST NON AFRICAN AMERICAN: 41 mL/min — AB (ref >60)
Glucose, Bld: 367 mg/dL — ABNORMAL HIGH (ref 70–99)
Potassium: 5.2 mmol/L — ABNORMAL HIGH (ref 3.5–5.1)
SODIUM: 147 mmol/L — AB (ref 135–145)

## 2017-12-20 LAB — POCT I-STAT 3, ART BLOOD GAS (G3+)
Acid-base deficit: 12 mmol/L — ABNORMAL HIGH (ref 0.0–2.0)
Bicarbonate: 18 mmol/L — ABNORMAL LOW (ref 20.0–28.0)
O2 Saturation: 80 %
TCO2: 20 mmol/L — ABNORMAL LOW (ref 22–32)
pCO2 arterial: 57.8 mmHg — ABNORMAL HIGH (ref 32.0–48.0)
pH, Arterial: 7.102 — CL (ref 7.350–7.450)
pO2, Arterial: 60 mmHg — ABNORMAL LOW (ref 83.0–108.0)

## 2017-12-20 LAB — COMPREHENSIVE METABOLIC PANEL
ALBUMIN: 1.6 g/dL — AB (ref 3.5–5.0)
ALT: 59 U/L — ABNORMAL HIGH (ref 0–44)
AST: 246 U/L — ABNORMAL HIGH (ref 15–41)
Alkaline Phosphatase: 250 U/L — ABNORMAL HIGH (ref 38–126)
Anion gap: 12 (ref 5–15)
BUN: 30 mg/dL — ABNORMAL HIGH (ref 6–20)
CO2: 19 mmol/L — ABNORMAL LOW (ref 22–32)
Calcium: 8.7 mg/dL — ABNORMAL LOW (ref 8.9–10.3)
Chloride: 115 mmol/L — ABNORMAL HIGH (ref 98–111)
Creatinine, Ser: 1.4 mg/dL — ABNORMAL HIGH (ref 0.44–1.00)
GFR calc Af Amer: 52 mL/min — ABNORMAL LOW (ref >60)
GFR calc non Af Amer: 45 mL/min — ABNORMAL LOW (ref >60)
GLUCOSE: 220 mg/dL — AB (ref 70–99)
POTASSIUM: 2.8 mmol/L — AB (ref 3.5–5.1)
Sodium: 146 mmol/L — ABNORMAL HIGH (ref 135–145)
Total Bilirubin: 0.7 mg/dL (ref 0.3–1.2)
Total Protein: 6 g/dL — ABNORMAL LOW (ref 6.5–8.1)

## 2017-12-20 IMAGING — RF DG SWALLOWING FUNCTION - NRPT [facility]
12 series · 17 of 24 positions shown · non-contrast
Comparison: none

[Series 1: fluoro_barium swallow 2fps_bw · 0.17mm/px · 1 of 1 slices shown]
[im 1/1]
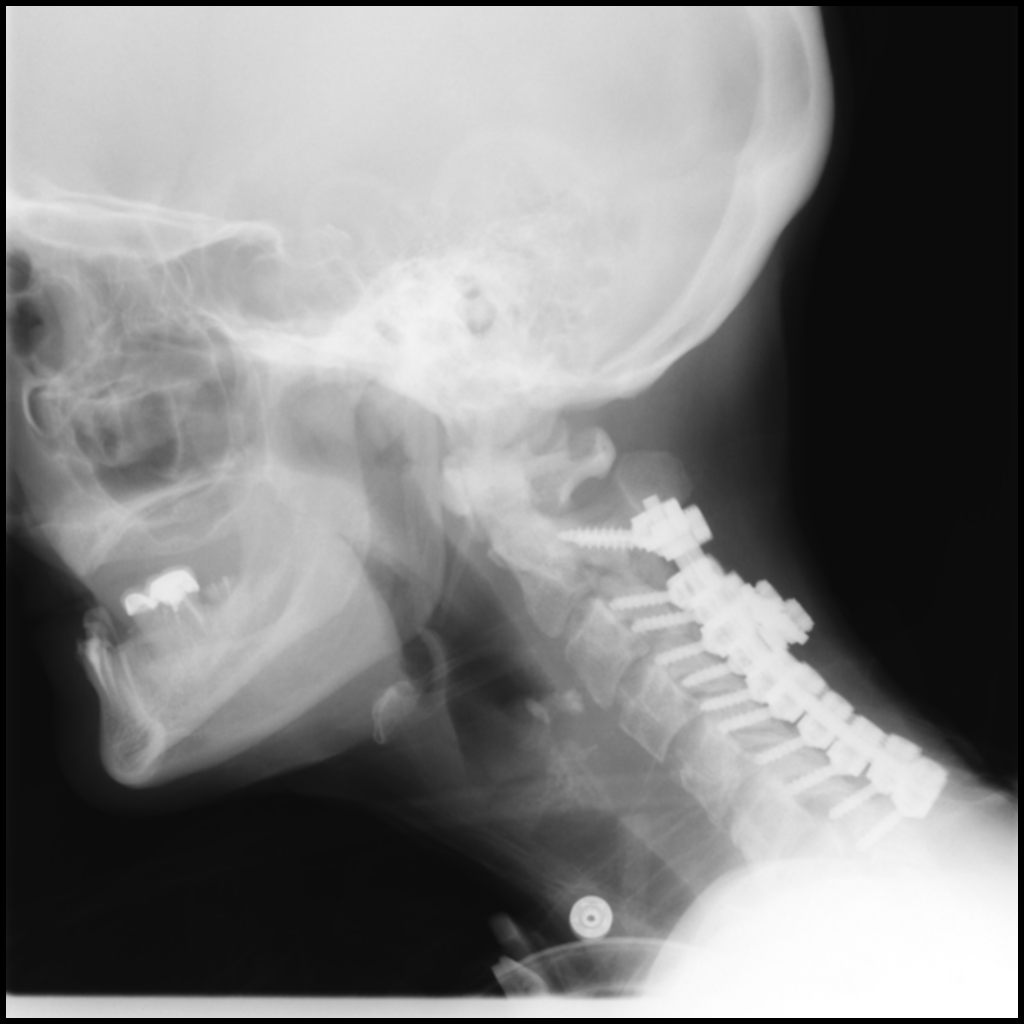

[Series 2: cp_standard · 0.34mm/px · 1 of 23 frames shown (1 of 11)]
[frame 20/23]
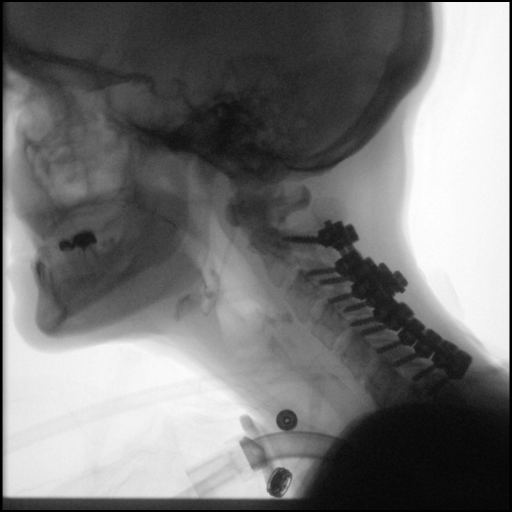

[Series 3: cp_standard · 0.34mm/px · 2 of 82 frames shown (2 of 11)]
[frame 16/82]
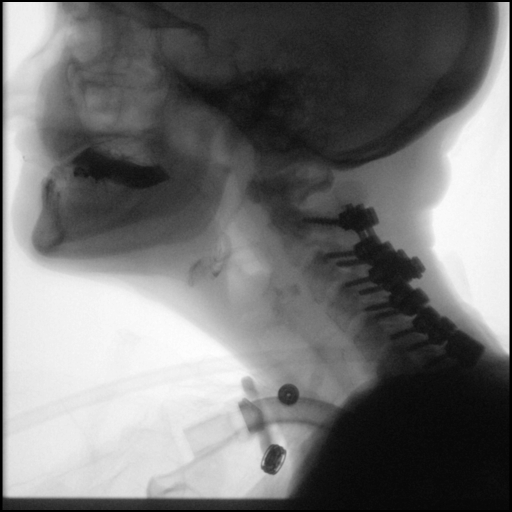
[frame 70/82]
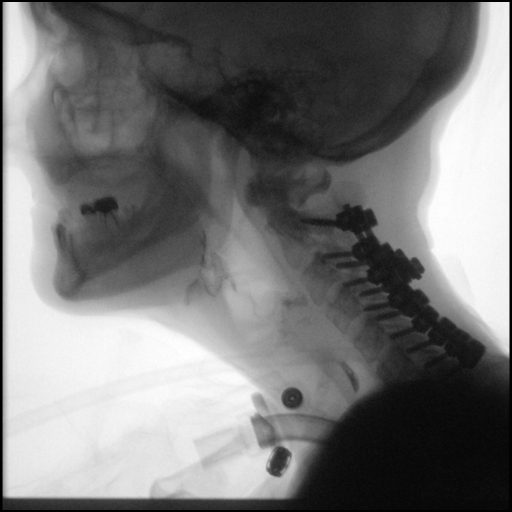

[Series 4: cp_standard · 0.34mm/px · 1 of 121 frames shown (3 of 11)]
[frame 61/121]
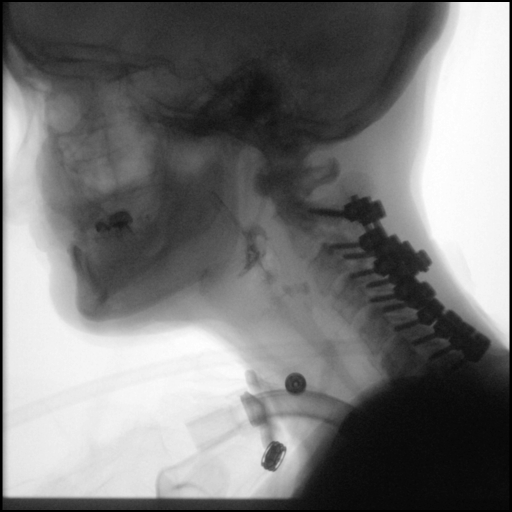

[Series 5: cp_standard · 0.34mm/px · 1 of 45 frames shown (4 of 11)]
[frame 7/45]
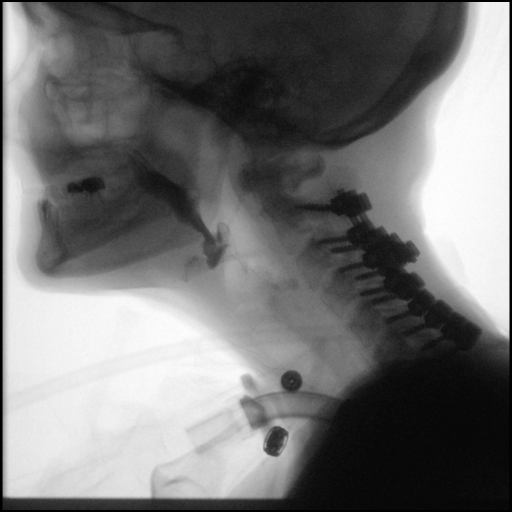

[Series 6: cp_standard · 0.34mm/px · 2 of 189 frames shown (5 of 11)]
[frame 29/189]
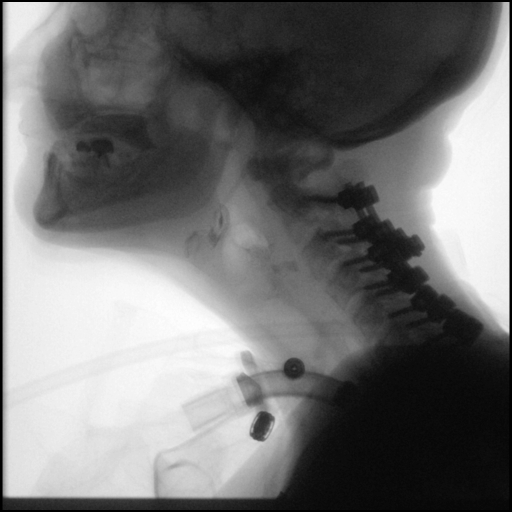
[frame 95/189]
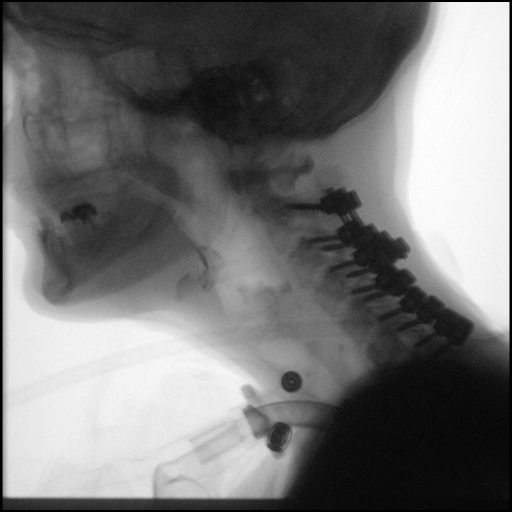

[Series 7: cp_standard · 0.34mm/px · 1 of 65 frames shown (6 of 11)]
[frame 56/65]
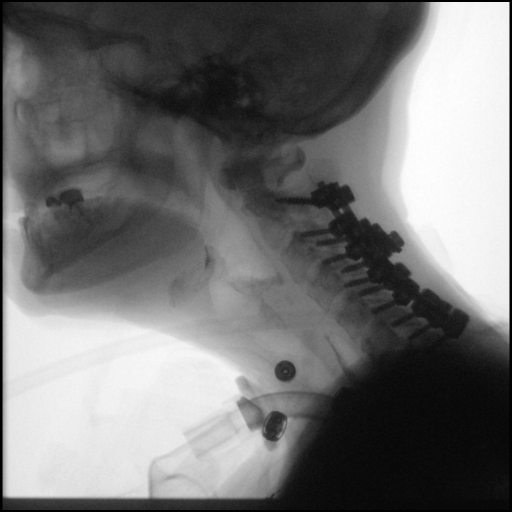

[Series 8: cp_standard · 0.34mm/px · 2 of 74 frames shown (7 of 11)]
[frame 12/74]
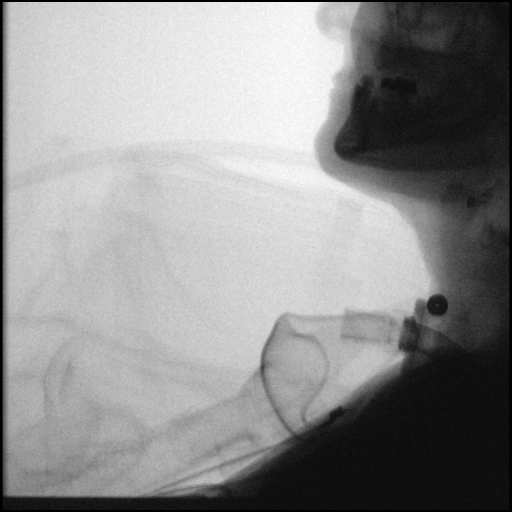
[frame 38/74]
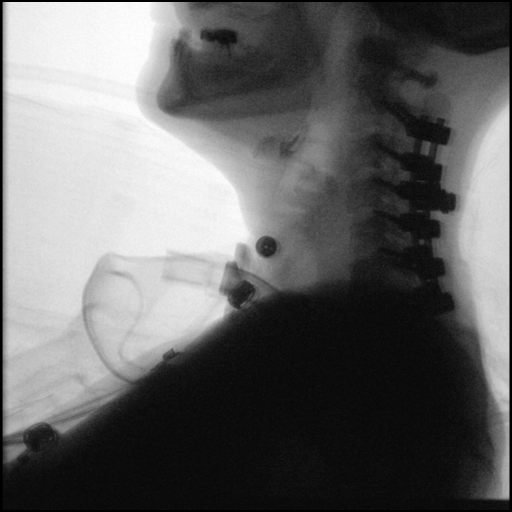

[Series 9: cp_standard · 0.34mm/px · 1 of 33 frames shown (8 of 11)]
[frame 29/33]
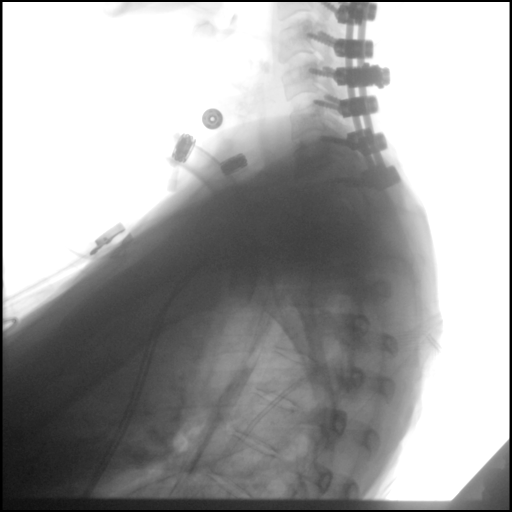

[Series 10: cp_standard · 0.34mm/px · 2 of 214 frames shown (9 of 11)]
[frame 33/214]
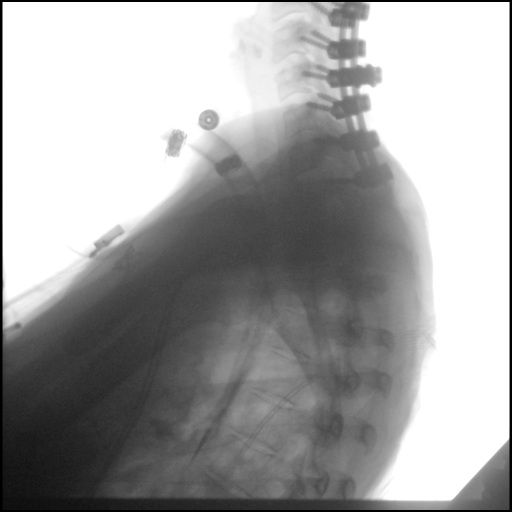
[frame 182/214]
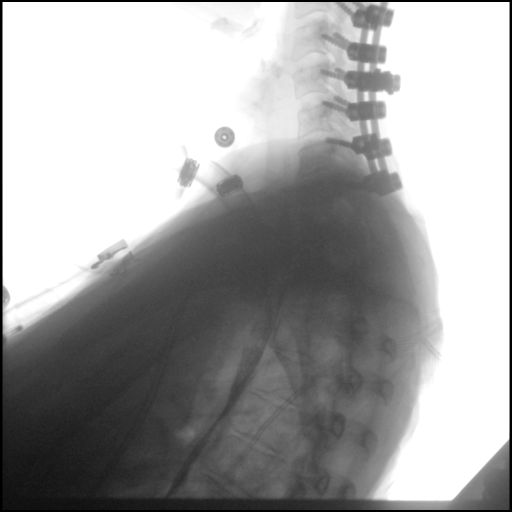

[Series 11: cp_standard · 0.34mm/px · 2 of 119 frames shown (10 of 11)]
[frame 60/119]
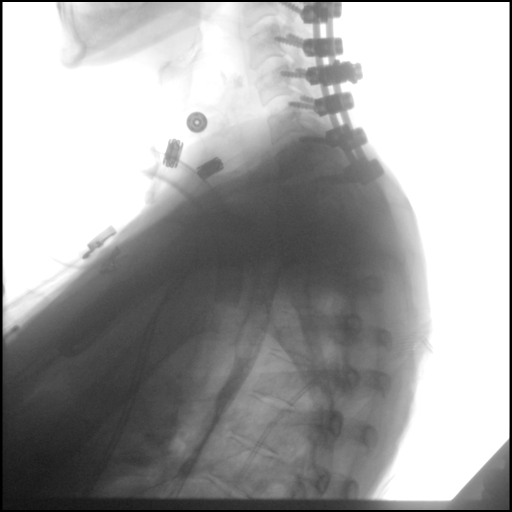
[frame 104/119]
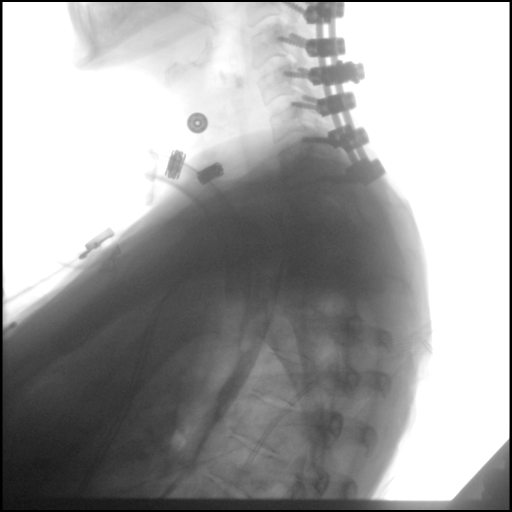

[Series 12: cp_standard · 0.34mm/px · 1 of 11 frames shown (11 of 11)]
[frame 11/11]
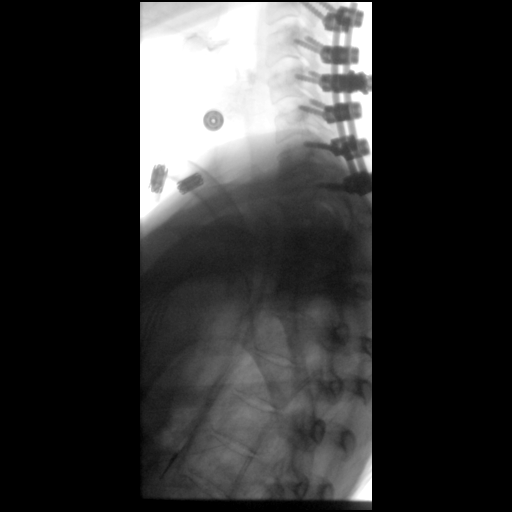

[17 of 24 positions shown; findings below may reference images not displayed]

FLUOROSCOPY FOR SWALLOWING FUNCTION STUDY:
Fluoroscopy was provided for swallowing function study, which was administered by a speech pathologist.  Final results and recommendations from this study are contained within the speech pathology report.

## 2017-12-20 IMAGING — DX DG ABDOMEN 1V
1 series · 1 of 1 positions shown · non-contrast
Comparison: CT abdomen pelvis 11/12/2015

CLINICAL DATA: NG tube placement

EXAM:
ABDOMEN - 1 VIEW

[abdomen kub]
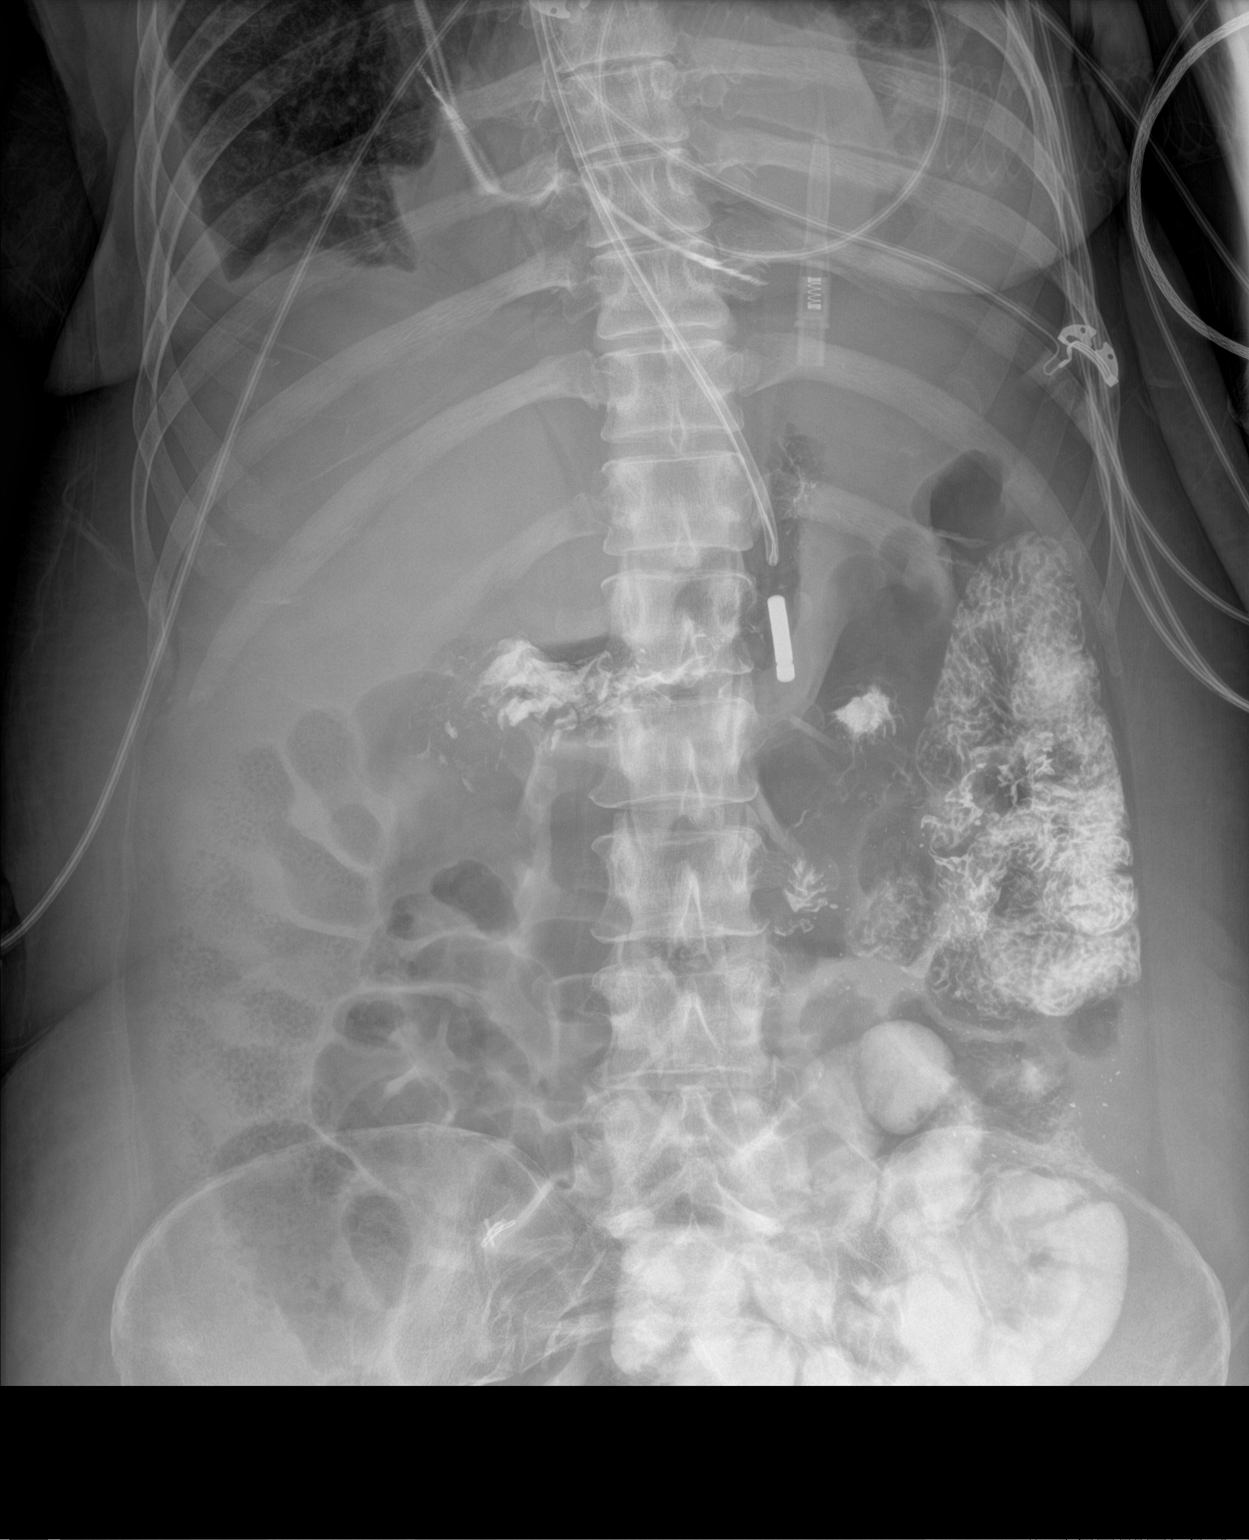

[1 of 1 positions shown; findings below may reference images not displayed]

FINDINGS: Metallic tip of the nasogastric tube overlies the pylorus. Contrast
material instilled through the tube is seen at the gastroduodenal
junction extending into the small bowel.
IMPRESSION: Nasogastric tube tip overlying the pylorus.

## 2017-12-20 MED ORDER — POTASSIUM CHLORIDE 20 MEQ/15ML (10%) PO SOLN
40.0000 meq | ORAL | Status: AC
  Administered 2017-12-20 (×2): 40 meq
  Filled 2017-12-20 (×2): qty 30

## 2017-12-20 MED ORDER — INSULIN DETEMIR 100 UNIT/ML ~~LOC~~ SOLN
10.0000 [IU] | Freq: Two times a day (BID) | SUBCUTANEOUS | Status: DC
  Administered 2017-12-20: 10 [IU] via SUBCUTANEOUS
  Filled 2017-12-20 (×2): qty 0.1

## 2017-12-20 MED ORDER — INSULIN ASPART 100 UNIT/ML ~~LOC~~ SOLN
0.0000 [IU] | SUBCUTANEOUS | Status: DC
  Administered 2017-12-20 – 2017-12-21 (×2): 15 [IU] via SUBCUTANEOUS
  Administered 2017-12-21: 4 [IU] via SUBCUTANEOUS
  Administered 2017-12-21: 15 [IU] via SUBCUTANEOUS
  Administered 2017-12-21: 7 [IU] via SUBCUTANEOUS
  Administered 2017-12-21 (×2): 15 [IU] via SUBCUTANEOUS
  Administered 2017-12-22: 4 [IU] via SUBCUTANEOUS
  Administered 2017-12-22 (×2): 3 [IU] via SUBCUTANEOUS
  Administered 2017-12-22 (×2): 4 [IU] via SUBCUTANEOUS
  Administered 2017-12-22: 3 [IU] via SUBCUTANEOUS
  Administered 2017-12-23: 4 [IU] via SUBCUTANEOUS
  Administered 2017-12-23: 3 [IU] via SUBCUTANEOUS
  Administered 2017-12-23: 4 [IU] via SUBCUTANEOUS
  Administered 2017-12-25: 3 [IU] via SUBCUTANEOUS
  Administered 2017-12-25: 7 [IU] via SUBCUTANEOUS
  Administered 2017-12-25: 3 [IU] via SUBCUTANEOUS
  Administered 2017-12-25: 7 [IU] via SUBCUTANEOUS
  Administered 2017-12-26 – 2017-12-27 (×5): 4 [IU] via SUBCUTANEOUS
  Administered 2017-12-27: 7 [IU] via SUBCUTANEOUS
  Administered 2017-12-27: 3 [IU] via SUBCUTANEOUS
  Administered 2017-12-27: 7 [IU] via SUBCUTANEOUS
  Administered 2017-12-27 – 2017-12-28 (×2): 4 [IU] via SUBCUTANEOUS
  Administered 2017-12-28: 7 [IU] via SUBCUTANEOUS
  Administered 2017-12-28 (×2): 4 [IU] via SUBCUTANEOUS
  Administered 2017-12-28: 7 [IU] via SUBCUTANEOUS
  Administered 2017-12-28 – 2017-12-29 (×2): 3 [IU] via SUBCUTANEOUS
  Administered 2017-12-29: 4 [IU] via SUBCUTANEOUS
  Administered 2017-12-29: 11 [IU] via SUBCUTANEOUS
  Administered 2017-12-29 – 2017-12-30 (×3): 4 [IU] via SUBCUTANEOUS
  Administered 2017-12-30: 7 [IU] via SUBCUTANEOUS
  Administered 2017-12-30: 4 [IU] via SUBCUTANEOUS
  Administered 2017-12-30: 7 [IU] via SUBCUTANEOUS
  Administered 2017-12-30: 3 [IU] via SUBCUTANEOUS
  Administered 2017-12-30: 4 [IU] via SUBCUTANEOUS
  Administered 2017-12-31 (×4): 3 [IU] via SUBCUTANEOUS

## 2017-12-20 MED ORDER — FREE WATER
200.0000 mL | Status: DC
  Administered 2017-12-20 – 2017-12-26 (×36): 200 mL

## 2017-12-20 MED ORDER — SODIUM CHLORIDE 3 % IN NEBU
4.0000 mL | INHALATION_SOLUTION | Freq: Two times a day (BID) | RESPIRATORY_TRACT | Status: AC
  Administered 2017-12-20 – 2017-12-22 (×5): 4 mL via RESPIRATORY_TRACT
  Filled 2017-12-20 (×6): qty 4

## 2017-12-20 MED ORDER — GABAPENTIN 300 MG PO CAPS
300.0000 mg | ORAL_CAPSULE | Freq: Three times a day (TID) | ORAL | Status: DC
  Administered 2017-12-20 – 2017-12-21 (×3): 300 mg
  Filled 2017-12-20 (×3): qty 1

## 2017-12-20 MED ORDER — BACLOFEN 10 MG PO TABS
10.0000 mg | ORAL_TABLET | Freq: Three times a day (TID) | ORAL | Status: DC
  Administered 2017-12-20 – 2017-12-31 (×34): 10 mg
  Filled 2017-12-20 (×38): qty 1

## 2017-12-20 MED ORDER — INSULIN ASPART 100 UNIT/ML ~~LOC~~ SOLN
7.0000 [IU] | SUBCUTANEOUS | Status: DC
  Administered 2017-12-20 – 2017-12-24 (×23): 7 [IU] via SUBCUTANEOUS

## 2017-12-20 NOTE — Progress Notes (Addendum)
NAME:  Angela Page, MRN:  [id number], DOB:  January 01, 1971, LOS: 4 ADMISSION DATE:  12/16/2017, CONSULTATION DATE:  12/16/2017 REFERRING MD:  Dr. Blaine Hamper, CHIEF COMPLAINT:  Acute encephalopathy  Brief History   47 yoF w/hx quadriplegia/trach/ peg/ suprapubic cath from home unresponsive after several episodes of nausea/ vomiting found to have glucose 1637 and hypotension, started on IVF and insulin gtt.   Past Medical History  quadriplegia due to C5-C7 injury from fall with tracheostomy, PEG tube, and suprapubic catheter, tobacco use, asthma, diastolic heart failure, chronic pain, opioid induced constipation, depression, GERD, 13-calorie malnutrition, and substance abuse  Significant Hospital Events   11/27 Admitted 11/30: Antibiotics widened as growing Acinetobacter and staph 12/1: Worsening atelectasis collapse on chest x-ray.  Staph is sensitive to methicillin so vancomycin stopped.  Adding aggressive pulmonary hygiene, aggressive free water replacement, continuing to titrate glycemic control regimen.  Mental status depressed, decreasing sedating medications and adjusting minute ventilation on ventilator Consults:   Procedures:  Prior Trach, PEG, suprapubic catheter  Significant Diagnostic Tests:  11/27 [facility] >>neg  Micro Data:  11/27 BCx 2>> 11/27 UC >>80 K yeast  11/28: Sputum: Abundant staph aureus, abundant Acinetobacter Antimicrobials:  levaquin 11/28>>> 11/30 11/30 meropenem 11/30 vancomycin>>12/1  Interim history/subjective:  Still spiking fever, otherwise no distress  Objective   Blood pressure (!) 81/68, pulse 67, temperature 97.7 F (36.5 C), temperature source Oral, resp. rate (!) 21, height 4\' 9"  (1.448 m), weight 56.4 kg, last menstrual period 05/27/2016, SpO2 100 %.   Intake/Output Summary (Last 24 hours) at 12/20/2017 1004 Last data filed at 12/20/2017 0900 Gross per 24 hour  Intake 2611.73 ml  Output 940 ml  Net 1671.73 ml   Filed Weights   12/18/17  0500 12/19/17 0500 12/20/17 0500  Weight: 56.4 kg 57.3 kg 57 kg    Examination:  General: 47 year old chronically ill-appearing female currently on full ventilator support she is flushed, but in no distress HEENT normocephalic atraumatic tracheostomy unremarkable Pulmonary: Coarse scattered rhonchi some accessory use denies dyspnea Cardiac: Tachycardic regular rate and rhythm Abdomen: Soft, PEG tube has ruptured.  Bowel sounds positive.  No pain.  Suprapubic cath unremarkable Extremities: Diffuse anasarca strong pulses Neuro: Awake, oriented, interactive.  Unable to move extremities, has baseline quadriplegia  Resolved Hospital Problem list   Anion gap metabolic acidosis  Hyponatremia/pseudohyponatremia d/t hyperglycemia  Hyperglycemic Hyperosmolar Nonketotic Coma: - glucose 1657 initially - now 150 -  No prior history of diabetes - possible first presentation of type 2 DM  Assessment & Plan:   Acute on chronic reps failure d/t altered mental status initially then c/b aspiration PNA (acinetobacter and abundant staph) H/o  COPD, on-going tobacco abuse, chronic trach Portable chest x-ray personally reviewed this demonstrates persistent bilateral airspace disease there is new worsening consolidation particularly in the right lobe with worsening right lower lobe collapse P:  Day #2 meropenem, can discontinue vancomycin Changed to pressure control ventilation Repeat ABG Adding hypertonic saline nebulizers as well as chest physiotherapy Repeat a.m. chest x-ray May need bronchoscopy if we cannot recruit right lung  Acute metabolic encephalopathy Essentially unresponsive this a.m.  Suspect this is metabolic in origin Plan Correct water imbalance Checking arterial blood gas, ensure not hypercarbic, have made ventilator changes Decrease in Neurontin by half Decrease baclofen by half May need CT imaging if no improvement  DM Type II w/ hyperglycemia ->glycemic control remains  challenging P:   Increase Levemir to 10 units every 12 Continuing sliding scale insulin Continuing basal tube feed  coverage as well  Urinary tract infection: Growing yeast Plan Add Diflucan Will need suprapubic cath change eventually  Acute on chronic hypotension  - on midodrine - exacerbated by hypovolemia P:  Continuing midodrine and IV fluids Sinus tachycardia related to infection and fevers. P Treat fever  AKI -this is worse P:  Continuing lactated Ringer's at 75 cc an hour Adding free water replacement Strict intake output Follow-up a.m. Chemistry  Fluid and electrolyte imbalance: Hypernatremia, Hyperchloremia, hypokalemia Plan Free water replacement Continuing LR Aggressive potassium replacement Serial chemistries  Severe pyrexia associated with infection - impaired temperature regulation due to quadriplegia. P: Continuing alternating ibuprofen and Tylenol  Quadriplegia, C5-C7, chronic pain P:  PRN fentanyl in place of oxy  Diastolic HF, HLD P:  Holding Lasix and fenofibrate  Opoid induced constipation->diarrhea as of 11/29 P: Holding lactulose, miralax and linzess d/t diarrhea   Protein calorie malnutrition P:  Cont tubefeeds  Best practice:  Diet: tubefeeds  Pain/Anxiety/Delirium protocol (if indicated): n/a VAP protocol (if indicated): n/a DVT prophylaxis: heparin SQ GI prophylaxis: pepcid Glucose control: glucostabilzer  Mobility: BR Code Status: Full Family Communication: Patient's caregiver at bedside updated on plan of care Disposition: Angela Page remains critically ill.  She is requiring frequent titration of ventilatory support in setting of Acinetobacter and staph pneumonia, she now has right lower lobe collapse and mucous plugging.  Her metabolic derangements remain quite challenging to control now having worsening acute kidney injury, hypernatremia, and hyperchloremia.  She will require close observation of glucose still, aggressive  correction of water imbalance, and aggressive pulmonary hygiene with titration of ventilatory support.  I am getting more concerned that Angela Page may not survive this illness.  She may need bronchoscopy tomorrow if we do not recruit her lung  My critical care x38 minutes Erick Colace ACNP-BC [place] Pager # [phone] OR # [phone] if no answer  Attending Note:  47 year old female quad after a C5-C7 injury who is chronically trached on the vent with a PEG and suprapubic catheter, presents to PCCM with AMS with metabolic encephalopathy and pneumonia.  On exam, decreased BS at the bases.  I reviewed CXR myself, atelectasis noted.  Discussed with PCCM-NP.  Will continue correction of AKI, hypernatremia and hyperchloremia.  Continue correction of water balance.  ISS.  CBGs.  May need to have a discussion regarding plan of care if patient continues to not improve.  PCCM will follow.  The patient is critically ill with multiple organ systems failure and requires high complexity decision making for assessment and support, frequent evaluation and titration of therapies, application of advanced monitoring technologies and extensive interpretation of multiple databases.   Critical Care Time devoted to patient care services described in this note is  32  Minutes. This time reflects time of care of this signee Dr Jennet Maduro. This critical care time does not reflect procedure time, or teaching time or supervisory time of PA/NP/Med student/Med Resident etc but could involve care discussion time.  Rush Farmer, M.D. [facility] Pulmonary/Critical Care Medicine. Pager: [phone]. After hours pager: [phone].

## 2017-12-20 NOTE — Progress Notes (Signed)
[facility] ADULT ICU REPLACEMENT PROTOCOL FOR AM LAB REPLACEMENT ONLY  The patient does apply for the [facility] Adult ICU Electrolyte Replacment Protocol based on the criteria listed below:   1. Is GFR >/= 40 ml/min? Yes.    Patient's GFR today is 45 2. Is urine output >/= 0.5 ml/kg/hr for the last 6 hours? Yes.   Patient's UOP is 0.6 ml/kg/hr 3. Is BUN < 60 mg/dL? Yes.    Patient's BUN today is 30 4. Abnormal electrolyte(s): k 2.8 5. Ordered repletion with: protocol 6. If a panic level lab has been reported, has the CCM MD in charge been notified? No..   Physician:    Ronda Fairly A 12/20/2017 5:02 AM

## 2017-12-21 ENCOUNTER — Inpatient Hospital Stay (HOSPITAL_COMMUNITY): Payer: Medicare Other

## 2017-12-21 ENCOUNTER — Other Ambulatory Visit: Payer: Self-pay | Admitting: Family Medicine

## 2017-12-21 ENCOUNTER — Telehealth: Payer: Self-pay | Admitting: *Deleted

## 2017-12-21 ENCOUNTER — Telehealth: Payer: Self-pay | Admitting: Family Medicine

## 2017-12-21 DIAGNOSIS — J9621 Acute and chronic respiratory failure with hypoxia: Secondary | ICD-10-CM

## 2017-12-21 DIAGNOSIS — G9340 Encephalopathy, unspecified: Secondary | ICD-10-CM

## 2017-12-21 DIAGNOSIS — I509 Heart failure, unspecified: Secondary | ICD-10-CM

## 2017-12-21 LAB — BLOOD GAS, ARTERIAL
Acid-base deficit: 8.8 mmol/L — ABNORMAL HIGH (ref 0.0–2.0)
Acid-base deficit: 9.5 mmol/L — ABNORMAL HIGH (ref 0.0–2.0)
Bicarbonate: 17.6 mmol/L — ABNORMAL LOW (ref 20.0–28.0)
Bicarbonate: 18.8 mmol/L — ABNORMAL LOW (ref 20.0–28.0)
DRAWN BY: 249101
Drawn by: 362771
FIO2: 60
FIO2: 60
O2 SAT: 98.8 %
O2 Saturation: 96.9 %
PEEP: 10 cmH2O
PEEP: 10 cmH2O
PH ART: 7.214 — AB (ref 7.350–7.450)
Patient temperature: 98.6
Patient temperature: 98.6
Pressure control: 15 cmH2O
Pressure control: 20 cmH2O
RATE: 25 resp/min
RATE: 30 resp/min
pCO2 arterial: 45.3 mmHg (ref 32.0–48.0)
pCO2 arterial: 64.1 mmHg — ABNORMAL HIGH (ref 32.0–48.0)
pH, Arterial: 7.094 — CL (ref 7.350–7.450)
pO2, Arterial: 123 mmHg — ABNORMAL HIGH (ref 83.0–108.0)
pO2, Arterial: 98 mmHg (ref 83.0–108.0)

## 2017-12-21 LAB — CBC
HCT: 35.9 % — ABNORMAL LOW (ref 36.0–46.0)
Hemoglobin: 10.3 g/dL — ABNORMAL LOW (ref 12.0–15.0)
MCH: 28 pg (ref 26.0–34.0)
MCHC: 28.7 g/dL — ABNORMAL LOW (ref 30.0–36.0)
MCV: 97.6 fL (ref 80.0–100.0)
NRBC: 0.2 % (ref 0.0–0.2)
Platelets: 132 10*3/uL — ABNORMAL LOW (ref 150–400)
RBC: 3.68 MIL/uL — ABNORMAL LOW (ref 3.87–5.11)
RDW: 15.9 % — ABNORMAL HIGH (ref 11.5–15.5)
WBC: 19.1 10*3/uL — ABNORMAL HIGH (ref 4.0–10.5)

## 2017-12-21 LAB — GLUCOSE, CAPILLARY
GLUCOSE-CAPILLARY: 312 mg/dL — AB (ref 70–99)
Glucose-Capillary: 193 mg/dL — ABNORMAL HIGH (ref 70–99)
Glucose-Capillary: 250 mg/dL — ABNORMAL HIGH (ref 70–99)
Glucose-Capillary: 309 mg/dL — ABNORMAL HIGH (ref 70–99)
Glucose-Capillary: 313 mg/dL — ABNORMAL HIGH (ref 70–99)
Glucose-Capillary: 345 mg/dL — ABNORMAL HIGH (ref 70–99)

## 2017-12-21 LAB — BASIC METABOLIC PANEL
Anion gap: 6 (ref 5–15)
BUN: 36 mg/dL — ABNORMAL HIGH (ref 6–20)
CALCIUM: 8.6 mg/dL — AB (ref 8.9–10.3)
CO2: 23 mmol/L (ref 22–32)
Chloride: 120 mmol/L — ABNORMAL HIGH (ref 98–111)
Creatinine, Ser: 1.55 mg/dL — ABNORMAL HIGH (ref 0.44–1.00)
GFR calc non Af Amer: 39 mL/min — ABNORMAL LOW (ref >60)
GFR, EST AFRICAN AMERICAN: 46 mL/min — AB (ref >60)
Glucose, Bld: 345 mg/dL — ABNORMAL HIGH (ref 70–99)
Potassium: 5.1 mmol/L (ref 3.5–5.1)
SODIUM: 149 mmol/L — AB (ref 135–145)

## 2017-12-21 LAB — CULTURE, BLOOD (ROUTINE X 2)
CULTURE: NO GROWTH
Culture: NO GROWTH
SPECIAL REQUESTS: ADEQUATE
Special Requests: ADEQUATE

## 2017-12-21 LAB — POCT I-STAT 3, ART BLOOD GAS (G3+)
Acid-base deficit: 9 mmol/L — ABNORMAL HIGH (ref 0.0–2.0)
Bicarbonate: 18.9 mmol/L — ABNORMAL LOW (ref 20.0–28.0)
O2 Saturation: 99 %
PO2 ART: 145 mmHg — AB (ref 83.0–108.0)
Patient temperature: 98.6
TCO2: 20 mmol/L — ABNORMAL LOW (ref 22–32)
pCO2 arterial: 47.6 mmHg (ref 32.0–48.0)
pH, Arterial: 7.208 — ABNORMAL LOW (ref 7.350–7.450)

## 2017-12-21 LAB — AMMONIA: Ammonia: 36 umol/L — ABNORMAL HIGH (ref 9–35)

## 2017-12-21 MED ORDER — INSULIN DETEMIR 100 UNIT/ML ~~LOC~~ SOLN
15.0000 [IU] | Freq: Two times a day (BID) | SUBCUTANEOUS | Status: DC
  Administered 2017-12-21 – 2017-12-27 (×13): 15 [IU] via SUBCUTANEOUS
  Filled 2017-12-21 (×17): qty 0.15

## 2017-12-21 MED ORDER — FAMOTIDINE 40 MG/5ML PO SUSR
20.0000 mg | Freq: Every day | ORAL | Status: DC
  Administered 2017-12-21 – 2017-12-30 (×10): 20 mg
  Filled 2017-12-21 (×11): qty 2.5

## 2017-12-21 NOTE — Progress Notes (Addendum)
Called elink, informed nurse Gretchen pt blood gas has resulted, also informed nurse of pt potassium level 5.2 and capillary blood glucose 345. Elzie Rings states will have md to review blood gas cbg and potassium results.

## 2017-12-21 NOTE — Progress Notes (Addendum)
NAME:  Angela Page, MRN:  [id number], DOB:  [birth year], LOS: 5 ADMISSION DATE:  12/16/2017, CONSULTATION DATE:  12/16/2017 REFERRING MD:  Dr. Blaine Hamper, CHIEF COMPLAINT:  Acute encephalopathy  Brief History   47 yoF w/hx quadriplegia/trach/ peg/ suprapubic cath from home unresponsive after several episodes of nausea/ vomiting found to have glucose 1637 and hypotension, started on IVF and insulin gtt.   Past Medical History  quadriplegia due to C5-C7 injury from fall with tracheostomy, PEG tube, and suprapubic catheter, tobacco use, asthma, diastolic heart failure, chronic pain, opioid induced constipation, depression, GERD, 13-calorie malnutrition, and substance abuse  Significant Hospital Events   11/27 Admitted 11/30: Antibiotics widened as growing Acinetobacter and staph 12/1: Worsening atelectasis collapse on chest x-ray.  Staph is sensitive to methicillin so vancomycin stopped.  Adding aggressive pulmonary hygiene, aggressive free water replacement, continuing to titrate glycemic control regimen.  Mental status depressed, decreasing sedating medications and adjusting minute ventilation on ventilator Consults:   Procedures:  Prior Trach, PEG, suprapubic catheter  Significant Diagnostic Tests:  11/27 [facility] >>neg  Micro Data:  11/27 BCx 2>> 11/27 UC >>80 K yeast  11/28: Sputum: Abundant staph aureus, abundant Acinetobacter  Antimicrobials:  levaquin 11/28>>> 11/30 11/30 meropenem 11/30 vancomycin>>12/1  Interim history/subjective:  Spiking fevers overnight, remains acutely ill appearing  Objective   Blood pressure (!) 81/68, pulse 67, temperature 97.7 F (36.5 C), temperature source Oral, resp. rate (!) 21, height 4\' 9"  (1.448 m), weight 56.4 kg, last menstrual period 05/27/2016, SpO2 100 %.   Intake/Output Summary (Last 24 hours) at 12/21/2017 1107 Last data filed at 12/21/2017 1000 Gross per 24 hour  Intake 4144.96 ml  Output 1825 ml  Net 2319.96 ml   Filed  Weights   12/18/17 0500 12/19/17 0500 12/20/17 0500  Weight: 56.4 kg 57.3 kg 57 kg    Examination:  General: 47 year old chronically ill-appearing female currently on full ventilator support she is flushed, but in no distress HEENT: [place]/AT, PERRL, EOM-I and MMM Pulmonary: Coarse BS diffusely Cardiac: Regular but tachy, Nl S1/S2 and -M/R/G Abdomen: Soft, NT, ND and +BS, PEG balloon ruptured and suprapubic catheter is in place Extremities: Diffuse anasarca strong pulses Neuro: Lethargic and difficult to arouse this AM  Resolved Hospital Problem list   Anion gap metabolic acidosis  Hyponatremia/pseudohyponatremia d/t hyperglycemia  Hyperglycemic Hyperosmolar Nonketotic Coma: - glucose 1657 initially - now 150 -  No prior history of diabetes - possible first presentation of type 2 DM  Assessment & Plan:   Acute on chronic reps failure d/t altered mental status initially then c/b aspiration PNA (acinetobacter and abundant staph) H/o  COPD, on-going tobacco abuse, chronic trach Portable chest x-ray personally reviewed this demonstrates persistent bilateral airspace disease there is new worsening consolidation particularly in the right lobe with worsening right lower lobe collapse P:  Day #3 meropenem, off vancomycin Maintain on PC ventilation but increase rate to 35 and PC of 25 with f/u ABG Added hypertonic saline nebulizers as well as chest physiotherapy Repeat a.m. chest x-ray and ABG Place right lung up and a lavage and suction with CXR at 4 PM, if remains down then will bronch  Acute metabolic encephalopathy Essentially unresponsive this a.m.  Suspect this is metabolic in origin Plan Correct water imbalance D/C Neurontin Decrease baclofen by half Head CT without contrast today given AMS Check ammonia level  DM Type II w/ hyperglycemia ->glycemic control remains challenging P:  Increase Levemir to 10 units every 12 Continuing sliding scale insulin Continuing basal  tube feed  coverage as well  Urinary tract infection: Growing yeast Plan Add Diflucan Will need suprapubic cath change eventually  Acute on chronic hypotension  - on midodrine - exacerbated by hypovolemia P:  Continuing midodrine and IV fluids Sinus tachycardia related to infection and fevers. Treat fever  AKI -this is worse P:  Continuing lactated Ringer's at 75 cc an hour given marginal BP and renal function Adding free water replacement Strict intake output Follow-up a.m. Chemistry Replace electrolytes as indicated  Fluid and electrolyte imbalance: Hypernatremia, Hyperchloremia, hypokalemia Plan Free water replacement Continuing LR Aggressive potassium replacement Serial chemistries  Severe pyrexia associated with infection - impaired temperature regulation due to quadriplegia. P: Continuing alternating ibuprofen and Tylenol  Quadriplegia, C5-C7, chronic pain P:  PRN fentanyl in place of oxy  Diastolic HF, HLD P:  Holding Lasix and fenofibrate  Opoid induced constipation->diarrhea as of 11/29 P: Holding lactulose, miralax and linzess d/t diarrhea   Protein calorie malnutrition P:  Cont tubefeeds  GOC: patient is deteriorating, I suspect unable to combat infection with MDR bacteria.  I spoke with the mother who was bedside.  Consented to bronch if needs be but considering if patient would want HD, CPR or cardioversion.  COntinue support for now.  Best practice:  Diet: tubefeeds  Pain/Anxiety/Delirium protocol (if indicated): n/a VAP protocol (if indicated): n/a DVT prophylaxis: heparin SQ GI prophylaxis: pepcid Glucose control: glucostabilzer  Mobility: BR Code Status: Full Family Communication: Patient's caregiver at bedside updated on plan of care Disposition: ICU full code  The patient is critically ill with multiple organ systems failure and requires high complexity decision making for assessment and support, frequent evaluation and titration of therapies,  application of advanced monitoring technologies and extensive interpretation of multiple databases.   Critical Care Time devoted to patient care services described in this note is  36  Minutes. This time reflects time of care of this signee Dr Jennet Maduro. This critical care time does not reflect procedure time, or teaching time or supervisory time of PA/NP/Med student/Med Resident etc but could involve care discussion time.  Rush Farmer, M.D. [facility] Pulmonary/Critical Care Medicine. Pager: [phone]. After hours pager: [phone].

## 2017-12-21 NOTE — Progress Notes (Signed)
Transported pt to CT with RN 

## 2017-12-21 NOTE — Telephone Encounter (Signed)
Copied from [place] [id number]. Topic: Quick Communication - See Telephone Encounter >> Dec 21, 2017  9:13 AM Reyne Dumas L wrote: CRM for notification. See Telephone encounter for: 12/21/17.  Pt's mother, [name], calling in. States that pt is in the hospital but they still need to have the 485 form completed for [facility] and pt's mother needs to know how to go about having that done.  Pt's appointment for tomorrow was cancelled where they were going to address this issue. [name] can be reached at [phone]

## 2017-12-21 NOTE — Progress Notes (Signed)
Inpatient Diabetes Program Recommendations  AACE/ADA: New Consensus Statement on Inpatient Glycemic Control (2015)  Target Ranges:  Prepandial:   less than 140 mg/dL      Peak postprandial:   less than 180 mg/dL (1-2 hours)      Critically ill patients:  140 - 180 mg/dL   Lab Results  Component Value Date   GLUCAP 312 (H) 12/21/2017   HGBA1C 11.5 (H) 12/17/2017    Review of Glycemic ControlResults for Angela Page, Angela Page (MRN [id number]) as of 12/21/2017 12:21  Ref. Range 12/20/2017 19:34 12/21/2017 00:14 12/21/2017 04:46 12/21/2017 07:43 12/21/2017 11:38  Glucose-Capillary Latest Ref Range: 70 - 99 mg/dL 342 (H) 345 (H) 309 (H) 313 (H) 312 (H)   Diabetes history: No previous Hx. Of DM  Outpatient Diabetes medications:  None Current orders for Inpatient glycemic control:  Levemir 15 units bid, Novolog 7 units q 4 hours, Novolog resistant q 4 hours Inpatient Diabetes Program Recommendations:   Note that blood sugars>300 mg/dL.  If appropriate, may consider restarting insulin drip to keep blood sugars 140-180 mg/dL while in ICU.    Thanks,  Adah Perl, RN, BC-ADM Inpatient Diabetes Coordinator Pager [phone] (8a-5p)

## 2017-12-21 NOTE — Progress Notes (Signed)
Called elink, informed nurse Gretchen pt blood gas has resulted, Elzie Rings states will have md to address current blood gas results, recent potassium level and capillary blood glucose.

## 2017-12-21 NOTE — Progress Notes (Signed)
eLink Physician-Brief Progress Note Patient Name: Angela Page DOB: [birth year] MRN: [id number]   Date of Service  12/21/2017  HPI/Events of Note  Marked acidosis which appears to be predominantly respiratory secondary to hypoventilation on pressure control ventilation  eICU Interventions  Increase respiratory rate to 30 and PC to 20 in an attempt to significantly increase minute ventilation, ABG in one hour following changes, will consider addressing a metabolic component if respiratory intervention does not normalize patient's [place]        Claudie Brickhouse U Jackie Russman 12/21/2017, 12:37 AM

## 2017-12-21 NOTE — Progress Notes (Signed)
Called elink, informed nurse Gretchen pt with cool edematous mottled periphery, palpable pulses intact, febrile and HR 130s. Vent changes made earlier today, will recheck blood gas.

## 2017-12-21 NOTE — Telephone Encounter (Signed)
Copied from [place] [id number]. Topic: General - Inquiry >> Dec 18, 2017 11:11 AM Scherrie Gerlach wrote: Reason for CRM: Mom is wanting a call back from Dr [name] asap.  Pt is in the hospital and she is needing a form to [employer] as well.  Mom had to cancel pt's appt for 12/03 and will have to call back to reschedule. Advised mom Dr [name] would not be in the office until Monday am, but I would send the message.

## 2017-12-22 ENCOUNTER — Inpatient Hospital Stay (HOSPITAL_COMMUNITY): Payer: Medicare Other

## 2017-12-22 ENCOUNTER — Ambulatory Visit: Payer: Medicare Other | Admitting: Family Medicine

## 2017-12-22 DIAGNOSIS — J810 Acute pulmonary edema: Secondary | ICD-10-CM

## 2017-12-22 LAB — BASIC METABOLIC PANEL
Anion gap: 9 (ref 5–15)
BUN: 38 mg/dL — ABNORMAL HIGH (ref 6–20)
CHLORIDE: 116 mmol/L — AB (ref 98–111)
CO2: 20 mmol/L — ABNORMAL LOW (ref 22–32)
Calcium: 8.9 mg/dL (ref 8.9–10.3)
Creatinine, Ser: 1.54 mg/dL — ABNORMAL HIGH (ref 0.44–1.00)
GFR calc non Af Amer: 40 mL/min — ABNORMAL LOW (ref >60)
GFR, EST AFRICAN AMERICAN: 46 mL/min — AB (ref >60)
Glucose, Bld: 171 mg/dL — ABNORMAL HIGH (ref 70–99)
Potassium: 5 mmol/L (ref 3.5–5.1)
Sodium: 145 mmol/L (ref 135–145)

## 2017-12-22 LAB — CBC
HCT: 29.2 % — ABNORMAL LOW (ref 36.0–46.0)
HEMOGLOBIN: 9 g/dL — AB (ref 12.0–15.0)
MCH: 28.8 pg (ref 26.0–34.0)
MCHC: 30.8 g/dL (ref 30.0–36.0)
MCV: 93.3 fL (ref 80.0–100.0)
Platelets: 189 10*3/uL (ref 150–400)
RBC: 3.13 MIL/uL — ABNORMAL LOW (ref 3.87–5.11)
RDW: 15.7 % — ABNORMAL HIGH (ref 11.5–15.5)
WBC: 16.2 10*3/uL — ABNORMAL HIGH (ref 4.0–10.5)
nRBC: 0.1 % (ref 0.0–0.2)

## 2017-12-22 LAB — POCT I-STAT 3, ART BLOOD GAS (G3+)
Acid-base deficit: 5 mmol/L — ABNORMAL HIGH (ref 0.0–2.0)
Bicarbonate: 19.9 mmol/L — ABNORMAL LOW (ref 20.0–28.0)
O2 Saturation: 98 %
Patient temperature: 100.1
TCO2: 21 mmol/L — AB (ref 22–32)
pCO2 arterial: 33.9 mmHg (ref 32.0–48.0)
pH, Arterial: 7.379 (ref 7.350–7.450)
pO2, Arterial: 105 mmHg (ref 83.0–108.0)

## 2017-12-22 LAB — PHOSPHORUS: PHOSPHORUS: 1.3 mg/dL — AB (ref 2.5–4.6)

## 2017-12-22 LAB — GLUCOSE, CAPILLARY
Glucose-Capillary: 137 mg/dL — ABNORMAL HIGH (ref 70–99)
Glucose-Capillary: 140 mg/dL — ABNORMAL HIGH (ref 70–99)
Glucose-Capillary: 145 mg/dL — ABNORMAL HIGH (ref 70–99)
Glucose-Capillary: 149 mg/dL — ABNORMAL HIGH (ref 70–99)
Glucose-Capillary: 168 mg/dL — ABNORMAL HIGH (ref 70–99)
Glucose-Capillary: 172 mg/dL — ABNORMAL HIGH (ref 70–99)
Glucose-Capillary: 191 mg/dL — ABNORMAL HIGH (ref 70–99)

## 2017-12-22 LAB — MAGNESIUM: Magnesium: 2.1 mg/dL (ref 1.7–2.4)

## 2017-12-22 MED ORDER — FUROSEMIDE 10 MG/ML IJ SOLN
20.0000 mg | Freq: Three times a day (TID) | INTRAMUSCULAR | Status: AC
  Administered 2017-12-22 (×2): 20 mg via INTRAVENOUS
  Filled 2017-12-22 (×2): qty 2

## 2017-12-22 MED ORDER — SODIUM PHOSPHATES 45 MMOLE/15ML IV SOLN
20.0000 mmol | Freq: Once | INTRAVENOUS | Status: AC
  Administered 2017-12-22: 20 mmol via INTRAVENOUS
  Filled 2017-12-22: qty 6.67

## 2017-12-22 NOTE — Telephone Encounter (Signed)
Called patient mom let her know we will NOT be able to fill out forms with out patient being seen in office.

## 2017-12-22 NOTE — Progress Notes (Signed)
Pt's mother requested team fill out 485 Form to continue Medicare home health. Case Manager called and informed about form. Form placed in pt's chart for team to fill out.

## 2017-12-22 NOTE — Progress Notes (Signed)
NAME:  Angela Page, MRN:  [id number], DOB:  [birth year], LOS: 6 ADMISSION DATE:  12/16/2017, CONSULTATION DATE:  12/16/2017 REFERRING MD:  Dr. Blaine Hamper, CHIEF COMPLAINT:  Acute encephalopathy  Brief History   39 yoF w/hx quadriplegia/trach/ peg/ suprapubic cath from home unresponsive after several episodes of nausea/ vomiting found to have glucose 1637 and hypotension, started on IVF and insulin gtt.  Past Medical History  quadriplegia due to C5-C7 injury from fall with tracheostomy, PEG tube, and suprapubic catheter, tobacco use, asthma, diastolic heart failure, chronic pain, opioid induced constipation, depression, GERD, 13-calorie malnutrition, and substance abuse  Significant Hospital Events   11/27 Admitted 11/30: Antibiotics widened as growing Acinetobacter and staph 12/1: Worsening atelectasis collapse on chest x-ray.  Staph is sensitive to methicillin so vancomycin stopped.  Adding aggressive pulmonary hygiene, aggressive free water replacement, continuing to titrate glycemic control regimen.  Mental status depressed, decreasing sedating medications and adjusting minute ventilation on ventilator Consults:   Procedures:  Prior Trach, PEG, suprapubic catheter  Significant Diagnostic Tests:  11/27 [facility] >>neg  Micro Data:  11/27 BCx 2>> 11/27 UC >>80 K yeast  11/28: Sputum: Abundant staph aureus, abundant Acinetobacter  Antimicrobials:  levaquin 11/28>>> 11/30 11/30 meropenem 11/30 vancomycin>>12/1  Interim history/subjective:  No events overnight, continues to fail weaning Much more interactive this AM  Objective   Blood pressure (!) 81/68, pulse 67, temperature 97.7 F (36.5 C), temperature source Oral, resp. rate (!) 21, height 4\' 9"  (1.448 m), weight 56.4 kg, last menstrual period 05/27/2016, SpO2 100 %.   Intake/Output Summary (Last 24 hours) at 12/22/2017 0954 Last data filed at 12/22/2017 0900 Gross per 24 hour  Intake 3941.71 ml  Output 960 ml  Net 2981.71  ml   Filed Weights   12/19/17 0500 12/20/17 0500 12/22/17 0500  Weight: 57.3 kg 57 kg 60.6 kg   Examination:  General: Chronically ill appearing female, contracted, quad HEENT: [place]/AT, PERRL, EOM-I and MMM Pulmonary: Coarse BS diffusely Cardiac: RRR, Nl S1/S2 and -M/R/G Abdomen: Soft, NT, ND and +BS Extremities: Anasarca Neuro: Lethargic but tracking  Resolved Hospital Problem list   Anion gap metabolic acidosis  Hyponatremia/pseudohyponatremia d/t hyperglycemia  Hyperglycemic Hyperosmolar Nonketotic Coma: - glucose 1657 initially - now 150 -  No prior history of diabetes - possible first presentation of type 2 DM  Assessment & Plan:   Acute on chronic reps failure d/t altered mental status initially then c/b aspiration PNA (acinetobacter and abundant staph) H/o  COPD, on-going tobacco abuse, chronic trach Portable chest x-ray personally reviewed this demonstrates persistent bilateral airspace disease there is new worsening consolidation particularly in the right lobe with worsening right lower lobe collapse P:  Day #5 meropenem, off vancomycin Maintain on PC ventilation full support for now Hypertonic saline nebulized for secretions Repeat CXR in AM for ?atelectasis Place right lung up and a lavage and suction with CXR at 4 PM, if remains down then will bronch  Acute metabolic encephalopathy Much more interactive this AM Plan Correct water imbalance D/Ced Neurontin Decreased baclofen by half and continue for now Head CT negative Ammonia level 37, no need for lactulose  DM Type II w/ hyperglycemia ->glycemic control remains challenging P:  Increased Levemir to 10 units every 12 Continuing sliding scale insulin Continuing basal tube feed coverage as well  Urinary tract infection: Growing yeast Plan Added Diflucan Will need suprapubic cath change eventually when more stable  Acute on chronic hypotension  - On midodrine - Exacerbated by hypovolemia P:  Continuing  midodrine KVO IVF Sinus tachycardia related to infection and fevers. Treat fever  AKI -this is worse P:  Continuing lactated Ringer's at 75 cc an hour given marginal BP and renal function Adding free water replacement Strict intake output Follow-up a.m. Chemistry Replace electrolytes as indicated  Fluid and electrolyte imbalance: Hypernatremia, Hyperchloremia, hypokalemia Plan Free water replacement D/C LR D/C additional potassium Serial chemistries  Severe pyrexia associated with infection - impaired temperature regulation due to quadriplegia. P: Continuing alternating ibuprofen and Tylenol  Quadriplegia, C5-C7, chronic pain P:  PRN fentanyl in place of oxy D/C oxy  Diastolic HF, HLD P:  Lasix 20 mg IV q8 x2 doses  Opoid induced constipation->diarrhea as of 11/29 P: Holding lactulose, miralax and linzess d/t diarrhea   Protein calorie malnutrition P:  Cont tubefeeds  GOC: Mom feels patient would want full medical care at this point  Best practice:  Diet: tubefeeds  Pain/Anxiety/Delirium protocol (if indicated): n/a VAP protocol (if indicated): n/a DVT prophylaxis: heparin SQ GI prophylaxis: pepcid Glucose control: glucostabilzer  Mobility: BR Code Status: Full Family Communication: Patient's caregiver at bedside updated on plan of care Disposition: ICU full code  The patient is critically ill with multiple organ systems failure and requires high complexity decision making for assessment and support, frequent evaluation and titration of therapies, application of advanced monitoring technologies and extensive interpretation of multiple databases.   Critical Care Time devoted to patient care services described in this note is  32  Minutes. This time reflects time of care of this signee Dr Jennet Maduro. This critical care time does not reflect procedure time, or teaching time or supervisory time of PA/NP/Med student/Med Resident etc but could involve care  discussion time.  Rush Farmer, M.D. [facility] Pulmonary/Critical Care Medicine. Pager: [phone]. After hours pager: [phone].

## 2017-12-22 NOTE — Progress Notes (Signed)
Pharmacy Antibiotic Note  Angela Page is a 47 y.o. female admitted on 12/16/2017 with AMS.  Pt on Meropenem (Day #4) for PNA. Tmax 101.1 yesterday, now afeb, WBC trending down to 16.2, SCr 1.54 (trended up this admission - pk 1.55) so hopefully at plateau. UOP ~1083ml yesterday.  Plan: Continue Meropenem 1gm IV Q12H F/u renal fxn, C&S, clinical status F/u LOT  Height: 4\' 9"  (144.8 cm) Weight: 133 lb 9.6 oz (60.6 kg) IBW/kg (Calculated) : 38.6  Temp (24hrs), Avg:99.2 F (37.3 C), Min:98.6 F (37 C), Max:99.5 F (37.5 C)  Recent Labs  Lab 12/16/17 0313  12/16/17 0521 12/16/17 0744 12/16/17 1052  12/18/17 0824 12/19/17 0623 12/20/17 0332 12/20/17 2032 12/21/17 0440 12/22/17 0408  WBC  --   --   --   --   --    < > 18.2* 25.5* 36.7*  --  19.1* 16.2*  CREATININE  --    < >  --  1.13*  --    < > 1.16*  --  1.40* 1.49* 1.55* 1.54*  LATICACIDVEN 3.99*  --  3.51* 10.3* 7.1*  --   --   --   --   --   --   --    < > = values in this interval not displayed.    Estimated Creatinine Clearance: 33.8 mL/min (A) (by C-G formula based on SCr of 1.54 mg/dL (H)).    Allergies  Allergen Reactions  . Erythromycin Nausea And Vomiting  . Nitrofurantoin Monohyd Macro Nausea And Vomiting  . Oxybutynin     Dries mouth out   . Penicillins Hives    Has patient had a PCN reaction causing immediate rash, facial/tongue/throat swelling, SOB or lightheadedness with hypotension: Yes Has patient had a PCN reaction causing severe rash involving mucus membranes or skin necrosis: Yes Has patient had a PCN reaction that required hospitalization No Has patient had a PCN reaction occurring within the last 10 years: No-childhood allergy If all of the above answers are "NO", then may proceed with Cephalosporin     Antimicrobials this admission: Meropenem 11/30>> Vanc x 1 11/30 Ceftriaxone 11/27>> 11/29 Levaquin 11/28 >>11/30  Dose adjustments this admission: N/A  Microbiology results: 11/27  blood x 2: negative 11/27 urine: 80 k yeast 11/27 resp: abundant MSSA, acinetobacter (S Cipro/Gent/Bactrim/Imi)  Thank you for allowing pharmacy to be a part of this patient's care.  Sherlon Handing, PharmD, BCPS Clinical pharmacist  **Pharmacist phone directory can now be found on [email or web address] ([email or web address]).  Listed under [place]. 12/22/2017 10:04 AM

## 2017-12-23 DIAGNOSIS — N179 Acute kidney failure, unspecified: Secondary | ICD-10-CM

## 2017-12-23 LAB — CBC
HCT: 25.6 % — ABNORMAL LOW (ref 36.0–46.0)
Hemoglobin: 8.1 g/dL — ABNORMAL LOW (ref 12.0–15.0)
MCH: 28.2 pg (ref 26.0–34.0)
MCHC: 31.6 g/dL (ref 30.0–36.0)
MCV: 89.2 fL (ref 80.0–100.0)
Platelets: 283 10*3/uL (ref 150–400)
RBC: 2.87 MIL/uL — ABNORMAL LOW (ref 3.87–5.11)
RDW: 15 % (ref 11.5–15.5)
WBC: 12.8 10*3/uL — ABNORMAL HIGH (ref 4.0–10.5)
nRBC: 0.2 % (ref 0.0–0.2)

## 2017-12-23 LAB — BASIC METABOLIC PANEL
Anion gap: 12 (ref 5–15)
BUN: 36 mg/dL — ABNORMAL HIGH (ref 6–20)
CALCIUM: 8.5 mg/dL — AB (ref 8.9–10.3)
CO2: 26 mmol/L (ref 22–32)
Chloride: 107 mmol/L (ref 98–111)
Creatinine, Ser: 1.35 mg/dL — ABNORMAL HIGH (ref 0.44–1.00)
GFR calc Af Amer: 54 mL/min — ABNORMAL LOW (ref >60)
GFR calc non Af Amer: 47 mL/min — ABNORMAL LOW (ref >60)
Glucose, Bld: 146 mg/dL — ABNORMAL HIGH (ref 70–99)
Potassium: 3.6 mmol/L (ref 3.5–5.1)
Sodium: 145 mmol/L (ref 135–145)

## 2017-12-23 LAB — GLUCOSE, CAPILLARY
Glucose-Capillary: 156 mg/dL — ABNORMAL HIGH (ref 70–99)
Glucose-Capillary: 118 mg/dL — ABNORMAL HIGH (ref 70–99)
Glucose-Capillary: 76 mg/dL (ref 70–99)
Glucose-Capillary: 71 mg/dL (ref 70–99)
Glucose-Capillary: 140 mg/dL — ABNORMAL HIGH (ref 70–99)
Glucose-Capillary: 112 mg/dL — ABNORMAL HIGH (ref 70–99)

## 2017-12-23 LAB — MAGNESIUM: Magnesium: 1.8 mg/dL (ref 1.7–2.4)

## 2017-12-23 LAB — PHOSPHORUS: Phosphorus: 3.5 mg/dL (ref 2.5–4.6)

## 2017-12-23 MED ORDER — VITAL AF 1.2 CAL PO LIQD
1000.0000 mL | ORAL | Status: DC
  Administered 2017-12-23 – 2017-12-30 (×5): 1000 mL

## 2017-12-23 MED ORDER — POTASSIUM CHLORIDE 20 MEQ/15ML (10%) PO SOLN
20.0000 meq | ORAL | Status: AC
  Administered 2017-12-23 (×2): 20 meq
  Filled 2017-12-23 (×2): qty 15

## 2017-12-23 MED ORDER — PRO-STAT SUGAR FREE PO LIQD
30.0000 mL | Freq: Every day | ORAL | Status: DC
  Administered 2017-12-23 – 2017-12-30 (×8): 30 mL via ORAL
  Filled 2017-12-23 (×8): qty 30

## 2017-12-23 NOTE — Progress Notes (Signed)
Nutrition Follow-up  DOCUMENTATION CODES:   Not applicable  INTERVENTION:   Change TF regimen to better meet re-estimated needs:  Vital AF 1.2 at 45 ml/h (1080 ml per day)  Pro-stat 30 ml once daily  Provides 1396 kcal, 96 gm protein, 876 ml free water daily  NUTRITION DIAGNOSIS:   Inadequate oral intake related to inability to eat as evidenced by NPO status.  Ongoing   GOAL:   Patient will meet greater than or equal to 90% of their needs  Met with TF  MONITOR:   Vent status, TF tolerance, Weight trends, Labs  ASSESSMENT:   47 year-old with hx of quadriplegia, trach, PEG, and suprapubic cath, tobacco use, asthma, CHF, chronic pain with associated opioid induced constipation, depression, GERD, calorie malnutrition, and substance abuse. She was found unresponsive at home after several episodes of nausea/ vomiting found to have glucose 1637 and hypotension, started on IVF and insulin gtt.  Patient is tolerating TF well to meet >100% of re-estimated needs. Currently receiving Osmolite 1.2 at 70 ml/h via PEG. Free water flushes 200 ml every 4 hours.  Patient remains on ventilator support via chronic trach. She was not vent dependent PTA. Will likely required vent support after discharge. MV: 9.6 L/min Temp (24hrs), Avg:99 F (37.2 C), Min:98.7 F (37.1 C), Max:99.2 F (37.3 C)   Labs reviewed. CBG's: [phone] Medications reviewed and include novolog, levemir.   Diet Order:   Diet Order            Diet NPO time specified  Diet effective now              EDUCATION NEEDS:   No education needs have been identified at this time  Skin:  Skin Assessment: Skin Integrity Issues: Skin Integrity Issues:: Stage II, Stage IV Stage II: sacrum, anus, R ischium Stage IV: R ischium  Last BM:  12/4 (type 7)  Height:   Ht Readings from Last 1 Encounters:  12/17/17 '4\' 9"'$  (1.448 m)    Weight:   Wt Readings from Last 1 Encounters:  12/23/17 59.6 kg     Ideal Body Weight:  41.45 kg  BMI:  Body mass index is 28.43 kg/m.  Estimated Nutritional Needs:   Kcal:  1400  Protein:  95-110 gm  Fluid:  >/= 1.5 L    Molli Barrows, RD, LDN, [place] Pager [phone] After Hours Pager [phone]

## 2017-12-23 NOTE — Care Management Note (Addendum)
Case Management Note  Patient Details  Name: Kataryna Mcquilkin MRN: [id number] Date of Birth: 02-Oct-1970  Subjective/Objective:   Pt admitted post being found unresponsive at home                Action/Plan:  PTA quad from home chronic trach/peg.  Pt has 24 hour care in the home   Expected Discharge Date:                  Expected Discharge Plan:  (From home with caregivers)  In-House Referral:  Clinical Social Work  Discharge planning Services  CM Consult  Post Acute Care Choice:    Choice offered to:     DME Arranged:    DME Agency:     HH Arranged:    [place] Agency:     Status of Service:     If discussed at [employer], dates discussed:    Additional Comments: 12/23/2017 Pt remains on ventilator (not on vent PTA).  Pt remains on IV antibiotics and hypertonic solutions with worsening right lower lobe collapse.  CM discussed pt with PCCM team - pt is too unstable at this time to even consider vent at home.  Pt remains a FULL Code.  Pt is active with [facility] 16 hours for [facility]. CM will continue to follow  Maryclare Labrador, RN 12/23/2017, 3:08 PM

## 2017-12-23 NOTE — Progress Notes (Signed)
NAME:  Angela Page, MRN:  [id number], DOB:  1970-08-24, LOS: 7 ADMISSION DATE:  12/16/2017, CONSULTATION DATE:  12/16/2017 REFERRING MD:  Dr. Blaine Hamper, CHIEF COMPLAINT:  Acute encephalopathy  Brief History   42 yoF w/hx quadriplegia/trach/ peg/ suprapubic cath from home unresponsive after several episodes of nausea/ vomiting found to have glucose 1637 and hypotension, started on IVF and insulin gtt.  Past Medical History  quadriplegia due to C5-C7 injury from fall with tracheostomy, PEG tube, and suprapubic catheter, tobacco use, asthma, diastolic heart failure, chronic pain, opioid induced constipation, depression, GERD, 13-calorie malnutrition, and substance abuse  Significant Hospital Events   11/27 Admitted 11/30: Antibiotics widened as growing Acinetobacter and staph 12/1: Worsening atelectasis collapse on chest x-ray.  Staph is sensitive to methicillin so vancomycin stopped.  Adding aggressive pulmonary hygiene, aggressive free water replacement, continuing to titrate glycemic control regimen.  Mental status depressed, decreasing sedating medications and adjusting minute ventilation on ventilator Consults:   Procedures:  Prior Trach, PEG, suprapubic catheter  Significant Diagnostic Tests:  11/27 [facility] >>neg  Micro Data:  11/27 BCx 2>> 11/27 UC >>80 K yeast  11/28: Sputum: Abundant staph aureus, abundant Acinetobacter  Antimicrobials:  levaquin 11/28>>> 11/30 11/30 meropenem>>>12/6 11/30 vancomycin>>12/1  Interim history/subjective:  No events overnight No new complaints  Objective   Blood pressure (!) 81/68, pulse 67, temperature 97.7 F (36.5 C), temperature source Oral, resp. rate (!) 21, height 4\' 9"  (1.448 m), weight 56.4 kg, last menstrual period 05/27/2016, SpO2 100 %.   Intake/Output Summary (Last 24 hours) at 12/23/2017 1124 Last data filed at 12/23/2017 0900 Gross per 24 hour  Intake 1427.11 ml  Output 3500 ml  Net -2072.89 ml   Filed Weights   12/20/17 0500 12/22/17 0500 12/23/17 0459  Weight: 57 kg 60.6 kg 59.6 kg   Examination:  General: Chronically ill appearing female, NAD, quad, much more alert and interactive today HEENT: /AT, PERRL, EOM-I Pulmonary: Coarse BS diffusely Cardiac: RRR, Nl S1/S2 and -M/R/G Abdomen: Soft, NT, ND and +BS Extremities: Anasarca Neuro: Lethargic but awake and interactive  Resolved Hospital Problem list   Anion gap metabolic acidosis  Hyponatremia/pseudohyponatremia d/t hyperglycemia  Hyperglycemic Hyperosmolar Nonketotic Coma: - glucose 1657 initially - now 150 -  No prior history of diabetes - possible first presentation of type 2 DM  Assessment & Plan:   Acute on chronic reps failure d/t altered mental status initially then c/b aspiration PNA (acinetobacter and abundant staph) H/o  COPD, on-going tobacco abuse, chronic trach Portable chest x-ray personally reviewed this demonstrates persistent bilateral airspace disease there is new worsening consolidation particularly in the right lobe with worsening right lower lobe collapse P:  Day #6 meropenem, off vancomycin, d/c merrem after 12/6 dose Maintain on PC ventilation full support for now, not weaning, I believe will need vent at home at this point Hypertonic saline nebulized for secretions Repeat CXR in AM for ?atelectasis Continue to place right lung up and a lavage and suction, no bronch for now  Acute metabolic encephalopathy Much more interactive this AM Plan D/Ced Neurontin Decreased baclofen by half and continue for now Head CT negative Ammonia level 37, no need for lactulose  DM Type II w/ hyperglycemia ->glycemic control remains challenging P:  Increase Levemir to 15 units every 12 SSI CBG Continuing basal tube feed coverage as well  Urinary tract infection: Growing yeast Plan D/C diflucan Change suprapubic catheter Merrem as above  Acute on chronic hypotension  - On midodrine - Exacerbated by hypovolemia  P:   Continue midodrine KVO IVF Sinus tachycardia related to infection and fevers. Treat fever  AKI -this is worse P:  D/C LR Continue free water replacement Strict intake output Follow-up a.m. Chemistry Replace electrolytes as indicated  Fluid and electrolyte imbalance: Hypernatremia, Hyperchloremia, hypokalemia Plan Free water replacement D/C LR D/C additional potassium Serial chemistries  Severe pyrexia associated with infection - impaired temperature regulation due to quadriplegia. P: Continuing alternating ibuprofen and Tylenol  Quadriplegia, C5-C7, chronic pain P:  PRN fentanyl in place of oxy D/C oxy  Diastolic HF, HLD P:  Lasix 20 mg IV q8 x2 doses  Opoid induced constipation->diarrhea as of 11/29 P: Holding lactulose, miralax and linzess d/t diarrhea   Protein calorie malnutrition P:  Cont tubefeeds  GOC: Mom feels patient would want full medical care at this point  The patient is critically ill with multiple organ systems failure and requires high complexity decision making for assessment and support, frequent evaluation and titration of therapies, application of advanced monitoring technologies and extensive interpretation of multiple databases.   Critical Care Time devoted to patient care services described in this note is  33  Minutes. This time reflects time of care of this signee Dr Jennet Maduro. This critical care time does not reflect procedure time, or teaching time or supervisory time of PA/NP/Med student/Med Resident etc but could involve care discussion time.  Rush Farmer, M.D. [facility] Pulmonary/Critical Care Medicine. Pager: [phone]. After hours pager: [phone].

## 2017-12-23 NOTE — Progress Notes (Signed)
[facility] ADULT ICU REPLACEMENT PROTOCOL FOR AM LAB REPLACEMENT ONLY  The patient does apply for the [facility] Adult ICU Electrolyte Replacment Protocol based on the criteria listed below:   1. Is GFR >/= 40 ml/min? Yes.    Patient's GFR today is 47 2. Is urine output >/= 0.5 ml/kg/hr for the last 6 hours? Yes.   Patient's UOP is 1.3 ml/kg/hr 3. Is BUN < 60 mg/dL? Yes.    Patient's BUN today is 36 4. Abnormal electrolyte(s): K- 3.6 5. Ordered repletion with: per protocol 6. If a panic level lab has been reported, has the CCM MD in charge been notified? Yes.  .   Physician:  Dr. Terrill Mohr, Philis [place]ettle 12/23/2017 5:17 AM

## 2017-12-24 ENCOUNTER — Inpatient Hospital Stay (HOSPITAL_COMMUNITY): Payer: Medicare Other

## 2017-12-24 DIAGNOSIS — Z9359 Other cystostomy status: Secondary | ICD-10-CM

## 2017-12-24 DIAGNOSIS — I5032 Chronic diastolic (congestive) heart failure: Secondary | ICD-10-CM

## 2017-12-24 DIAGNOSIS — G8254 Quadriplegia, C5-C7 incomplete: Secondary | ICD-10-CM

## 2017-12-24 DIAGNOSIS — J42 Unspecified chronic bronchitis: Secondary | ICD-10-CM

## 2017-12-24 DIAGNOSIS — Z931 Gastrostomy status: Secondary | ICD-10-CM

## 2017-12-24 DIAGNOSIS — Z1624 Resistance to multiple antibiotics: Secondary | ICD-10-CM

## 2017-12-24 LAB — PHOSPHORUS: Phosphorus: 4.9 mg/dL — ABNORMAL HIGH (ref 2.5–4.6)

## 2017-12-24 LAB — BASIC METABOLIC PANEL
Anion gap: 12 (ref 5–15)
BUN: 37 mg/dL — ABNORMAL HIGH (ref 6–20)
CO2: 24 mmol/L (ref 22–32)
Calcium: 8.3 mg/dL — ABNORMAL LOW (ref 8.9–10.3)
Chloride: 104 mmol/L (ref 98–111)
Creatinine, Ser: 1.16 mg/dL — ABNORMAL HIGH (ref 0.44–1.00)
GFR calc Af Amer: >60 mL/min (ref >60)
GFR calc non Af Amer: 56 mL/min — ABNORMAL LOW (ref >60)
Glucose, Bld: 73 mg/dL (ref 70–99)
POTASSIUM: 3.9 mmol/L (ref 3.5–5.1)
Sodium: 140 mmol/L (ref 135–145)

## 2017-12-24 LAB — CBC
HCT: 24.5 % — ABNORMAL LOW (ref 36.0–46.0)
Hemoglobin: 8 g/dL — ABNORMAL LOW (ref 12.0–15.0)
MCH: 28.6 pg (ref 26.0–34.0)
MCHC: 32.7 g/dL (ref 30.0–36.0)
MCV: 87.5 fL (ref 80.0–100.0)
Platelets: 354 10*3/uL (ref 150–400)
RBC: 2.8 MIL/uL — ABNORMAL LOW (ref 3.87–5.11)
RDW: 14.9 % (ref 11.5–15.5)
WBC: 13.1 10*3/uL — ABNORMAL HIGH (ref 4.0–10.5)
nRBC: 0.2 % (ref 0.0–0.2)

## 2017-12-24 LAB — POCT I-STAT 3, ART BLOOD GAS (G3+)
Acid-Base Excess: 4 mmol/L — ABNORMAL HIGH (ref 0.0–2.0)
Bicarbonate: 25.9 mmol/L (ref 20.0–28.0)
O2 Saturation: 99 %
TCO2: 27 mmol/L (ref 22–32)
pCO2 arterial: 29.8 mmHg — ABNORMAL LOW (ref 32.0–48.0)
pH, Arterial: 7.545 — ABNORMAL HIGH (ref 7.350–7.450)
pO2, Arterial: 128 mmHg — ABNORMAL HIGH (ref 83.0–108.0)

## 2017-12-24 LAB — GLUCOSE, CAPILLARY
Glucose-Capillary: 70 mg/dL (ref 70–99)
Glucose-Capillary: 98 mg/dL (ref 70–99)
Glucose-Capillary: 103 mg/dL — ABNORMAL HIGH (ref 70–99)
Glucose-Capillary: 86 mg/dL (ref 70–99)
Glucose-Capillary: 102 mg/dL — ABNORMAL HIGH (ref 70–99)

## 2017-12-24 LAB — MAGNESIUM: Magnesium: 1.7 mg/dL (ref 1.7–2.4)

## 2017-12-24 MED ORDER — POTASSIUM CHLORIDE 20 MEQ/15ML (10%) PO SOLN
20.0000 meq | Freq: Every day | ORAL | Status: AC
  Administered 2017-12-24 – 2017-12-25 (×2): 20 meq via ORAL
  Filled 2017-12-24 (×2): qty 15

## 2017-12-24 MED ORDER — MAGNESIUM SULFATE 2 GM/50ML IV SOLN
2.0000 g | Freq: Once | INTRAVENOUS | Status: AC
  Administered 2017-12-24: 2 g via INTRAVENOUS
  Filled 2017-12-24: qty 50

## 2017-12-24 MED ORDER — FUROSEMIDE 10 MG/ML IJ SOLN
20.0000 mg | Freq: Two times a day (BID) | INTRAMUSCULAR | Status: DC
  Administered 2017-12-24 – 2017-12-25 (×2): 20 mg via INTRAVENOUS
  Filled 2017-12-24 (×2): qty 2

## 2017-12-24 NOTE — Progress Notes (Signed)
NAME:  Angela Page, MRN:  [id number], DOB:  [birth year], LOS: 8 ADMISSION DATE:  12/16/2017, CONSULTATION DATE:  12/16/2017 REFERRING MD:  Dr. Blaine Hamper, CHIEF COMPLAINT:  Acute encephalopathy  Brief History   53 yoF w/hx quadriplegia/trach/ peg/ suprapubic cath from home unresponsive after several episodes of nausea/ vomiting found to have glucose 1637 and hypotension, started on IVF and insulin gtt.  Past Medical History  quadriplegia due to C5-C7 injury from fall with tracheostomy, PEG tube, and suprapubic catheter, tobacco use, asthma, diastolic heart failure, chronic pain, opioid induced constipation, depression, GERD, 13-calorie malnutrition, and substance abuse  Significant Hospital Events   11/27 Admitted 11/30: Antibiotics widened as growing Acinetobacter and staph 12/1: Worsening atelectasis collapse on chest x-ray.  Staph is sensitive to methicillin so vancomycin stopped.  Adding aggressive pulmonary hygiene, aggressive free water replacement, continuing to titrate glycemic control regimen.  Mental status depressed, decreasing sedating medications and adjusting minute ventilation on ventilator Consults:   Procedures:  Prior Trach, PEG, suprapubic catheter  Significant Diagnostic Tests:  11/27 [facility] >>neg  Micro Data:  11/27 BCx 2>>neg  11/27 UC >>80 K yeast  11/28: Sputum: Abundant staph aureus, abundant Acinetobacter  Antimicrobials:  levaquin 11/28>>> 11/30 11/30 meropenem>>>12/6 11/30 vancomycin>>12/1  Interim history/subjective:  No change overnight  Failed wean yesterday - no attempt yet this am  Some hypoglycemia   Objective    Vitals:   12/24/17 1100 12/24/17 1110 12/24/17 1200 12/24/17 1226  BP: 111/82  (!) 146/84 (!) 146/84  Pulse: 80  96 91  Resp: (!) 29  18 (!) 24  Temp:  98.2 F (36.8 C)    TempSrc:  Oral    SpO2: 100%  99%   Weight:      Height:         Intake/Output Summary (Last 24 hours) at 12/24/2017 1227 Last data filed at 12/24/2017  1200 Gross per 24 hour  Intake 5366.53 ml  Output 1205 ml  Net 4161.53 ml   Filed Weights   12/22/17 0500 12/23/17 0459 12/24/17 0420  Weight: 60.6 kg 59.6 kg 61.1 kg   Examination:  General: Chronically ill appearing female, NAD, quad, much more alert and interactive today HEENT: [place]/AT, PERRL, EOM-I Pulmonary: Coarse BS diffusely Cardiac: RRR, Nl S1/S2 and -M/R/G Abdomen: Soft, NT, ND and +BS Extremities: Anasarca Neuro: Lethargic but awake and interactive  Resolved Hospital Problem list   Anion gap metabolic acidosis  Hyponatremia/pseudohyponatremia d/t hyperglycemia  Hyperglycemic Hyperosmolar Nonketotic Coma: - glucose 1657 initially - now 150 -  No prior history of diabetes - possible first presentation of type 2 DM  Assessment & Plan:   Acute on chronic reps failure d/t altered mental status initially then c/b aspiration PNA (acinetobacter and abundant staph) H/o  COPD, on-going tobacco abuse, chronic trach Portable chest x-ray personally reviewed this demonstrates persistent bilateral airspace disease there is new worsening consolidation particularly in the right lobe with worsening right lower lobe collapse P:  Day #7 meropenem - d/c merrem after 12/6 dose Vent support - 8cc/kg  F/u CXR  Decrease RR to 24 F/u ABG Not making much progress with weaning - may need to consider home vent v placement  Hypertonic saline nebulized for secretions Repeat CXR in AM for ?atelectasis Continue to place right lung up and a lavage and suction, no bronch for now Chest vest   Acute metabolic encephalopathy Plan Decreased baclofen by half and continue for now Head CT negative   DM Type II w/ hyperglycemia ->glycemic control  remains challenging P:  Continue Levemir to 15 units every 12 SSI CBG D/c TF coverage for now with mild hypoglycemia   Urinary tract infection Plan D/C diflucan Change suprapubic catheter - still leaking - RN to call foley team and assess Merrem as  above through 12/6  Acute on chronic hypotension  - On midodrine - Exacerbated by hypovolemia P:  Continue midodrine KVO IVF   AKI -this is worse P:  D/C LR Continue free water replacement Strict intake output Follow-up a.m. Chemistry Replace electrolytes as indicated  Fluid and electrolyte imbalance: Hypernatremia, Hyperchloremia, hypokalemia Plan Free water replacement F/u chem   Severe pyrexia associated with infection - impaired temperature regulation due to quadriplegia. P: Continuing alternating ibuprofen and Tylenol  Quadriplegia, C5-C7, chronic pain P:  PRN fentanyl    Diastolic HF, HLD P:  Monitor daily for lasix need   Opoid induced constipation->diarrhea as of 11/29 P: Holding lactulose, miralax and linzess d/t diarrhea   Protein calorie malnutrition P:  Cont tube feeds   No family at bedside on NP rounds 12/5   Nickolas Madrid, NP 12/24/2017  12:27 PM Pager: [phone] or [phone]

## 2017-12-24 NOTE — Progress Notes (Signed)
Pt placed on PS/CPAP by MD.

## 2017-12-25 LAB — GLUCOSE, CAPILLARY
Glucose-Capillary: 101 mg/dL — ABNORMAL HIGH (ref 70–99)
Glucose-Capillary: 102 mg/dL — ABNORMAL HIGH (ref 70–99)
Glucose-Capillary: 139 mg/dL — ABNORMAL HIGH (ref 70–99)
Glucose-Capillary: 159 mg/dL — ABNORMAL HIGH (ref 70–99)
Glucose-Capillary: 206 mg/dL — ABNORMAL HIGH (ref 70–99)
Glucose-Capillary: 222 mg/dL — ABNORMAL HIGH (ref 70–99)

## 2017-12-25 LAB — CBC
HCT: 25 % — ABNORMAL LOW (ref 36.0–46.0)
Hemoglobin: 8.1 g/dL — ABNORMAL LOW (ref 12.0–15.0)
MCH: 28.1 pg (ref 26.0–34.0)
MCHC: 32.4 g/dL (ref 30.0–36.0)
MCV: 86.8 fL (ref 80.0–100.0)
Platelets: 448 10*3/uL — ABNORMAL HIGH (ref 150–400)
RBC: 2.88 MIL/uL — ABNORMAL LOW (ref 3.87–5.11)
RDW: 14.8 % (ref 11.5–15.5)
WBC: 15.9 10*3/uL — ABNORMAL HIGH (ref 4.0–10.5)
nRBC: 0.1 % (ref 0.0–0.2)

## 2017-12-25 LAB — BASIC METABOLIC PANEL
Anion gap: 16 — ABNORMAL HIGH (ref 5–15)
BUN: 36 mg/dL — ABNORMAL HIGH (ref 6–20)
CALCIUM: 8.4 mg/dL — AB (ref 8.9–10.3)
CO2: 20 mmol/L — ABNORMAL LOW (ref 22–32)
Chloride: 103 mmol/L (ref 98–111)
Creatinine, Ser: 1.14 mg/dL — ABNORMAL HIGH (ref 0.44–1.00)
GFR calc Af Amer: >60 mL/min (ref >60)
GFR calc non Af Amer: 57 mL/min — ABNORMAL LOW (ref >60)
Glucose, Bld: 155 mg/dL — ABNORMAL HIGH (ref 70–99)
Potassium: 3.5 mmol/L (ref 3.5–5.1)
Sodium: 139 mmol/L (ref 135–145)

## 2017-12-25 LAB — MAGNESIUM: Magnesium: 2.2 mg/dL (ref 1.7–2.4)

## 2017-12-25 LAB — PHOSPHORUS: Phosphorus: 4.9 mg/dL — ABNORMAL HIGH (ref 2.5–4.6)

## 2017-12-25 IMAGING — DX DG ABD PORTABLE 1V
2 series · 2 of 2 positions shown · non-contrast
Comparison: 11/16/2015

CLINICAL DATA: All follow-up ileus.

EXAM:
PORTABLE ABDOMEN - 1 VIEW

[abdomen kub (1 of 2)]
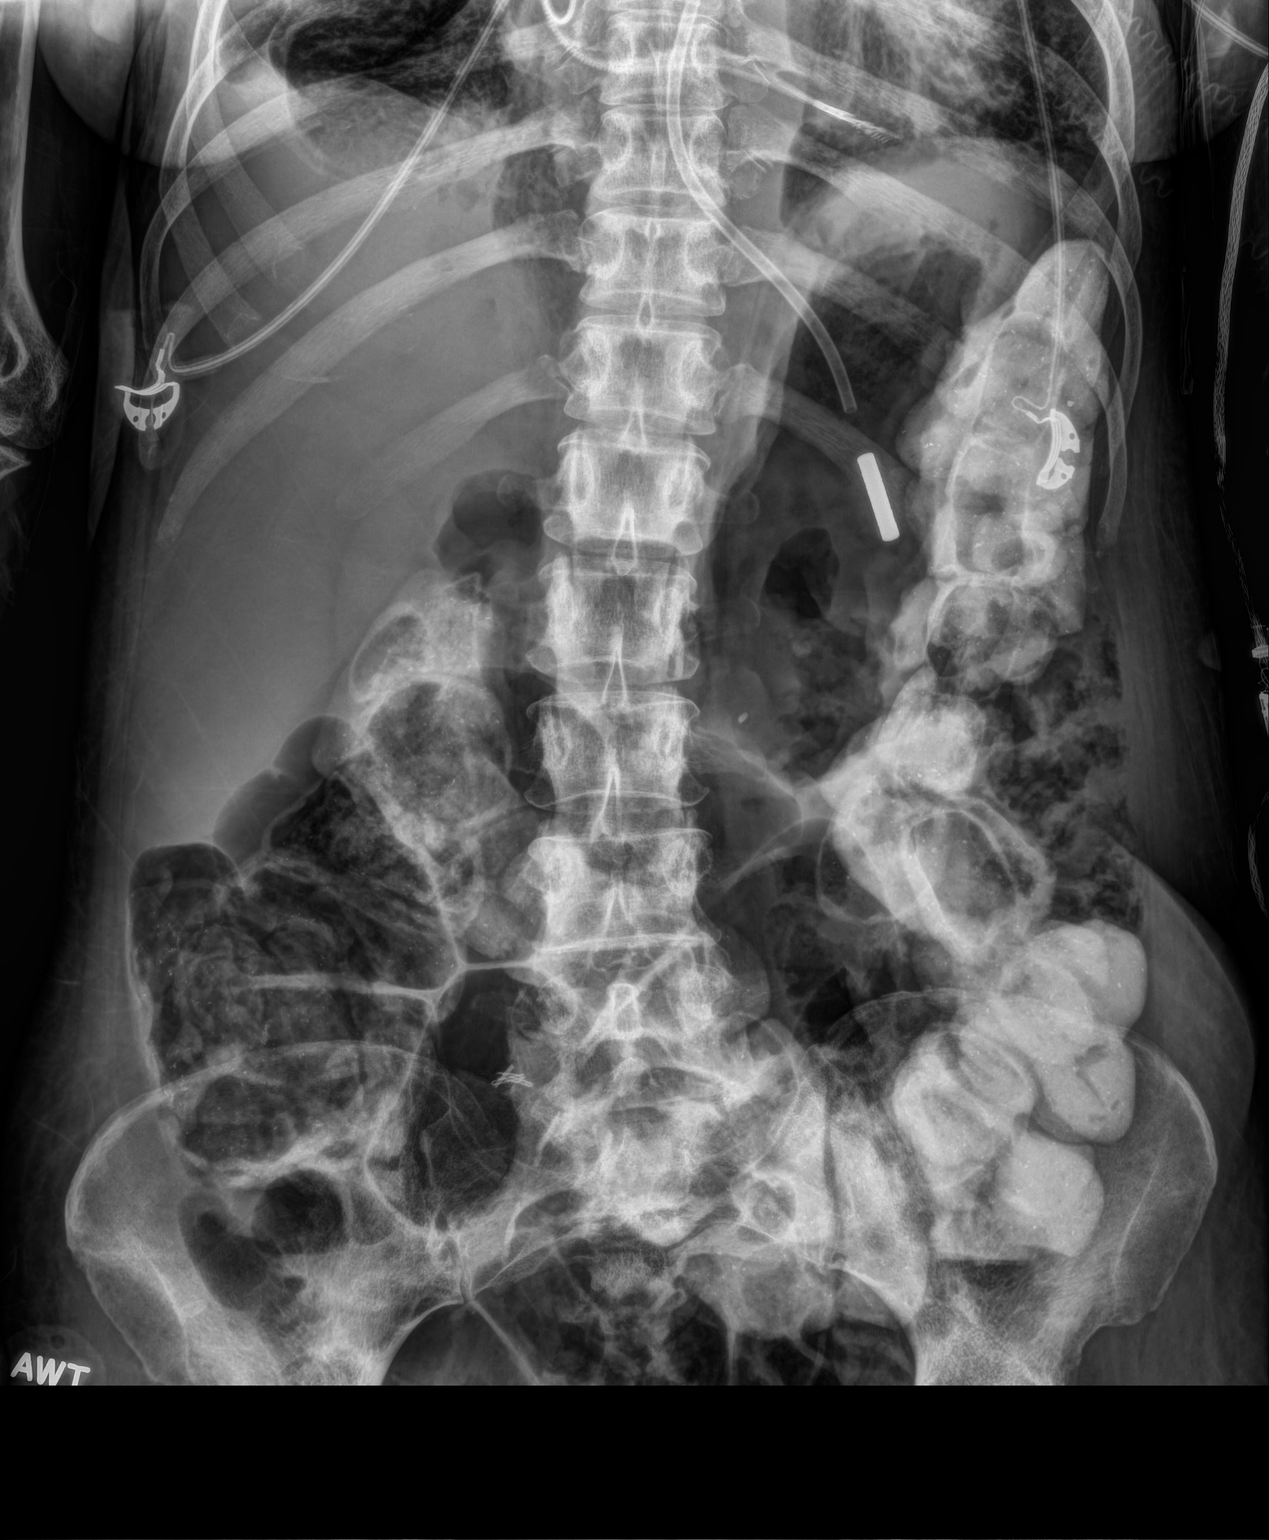

[abdomen kub (2 of 2)]
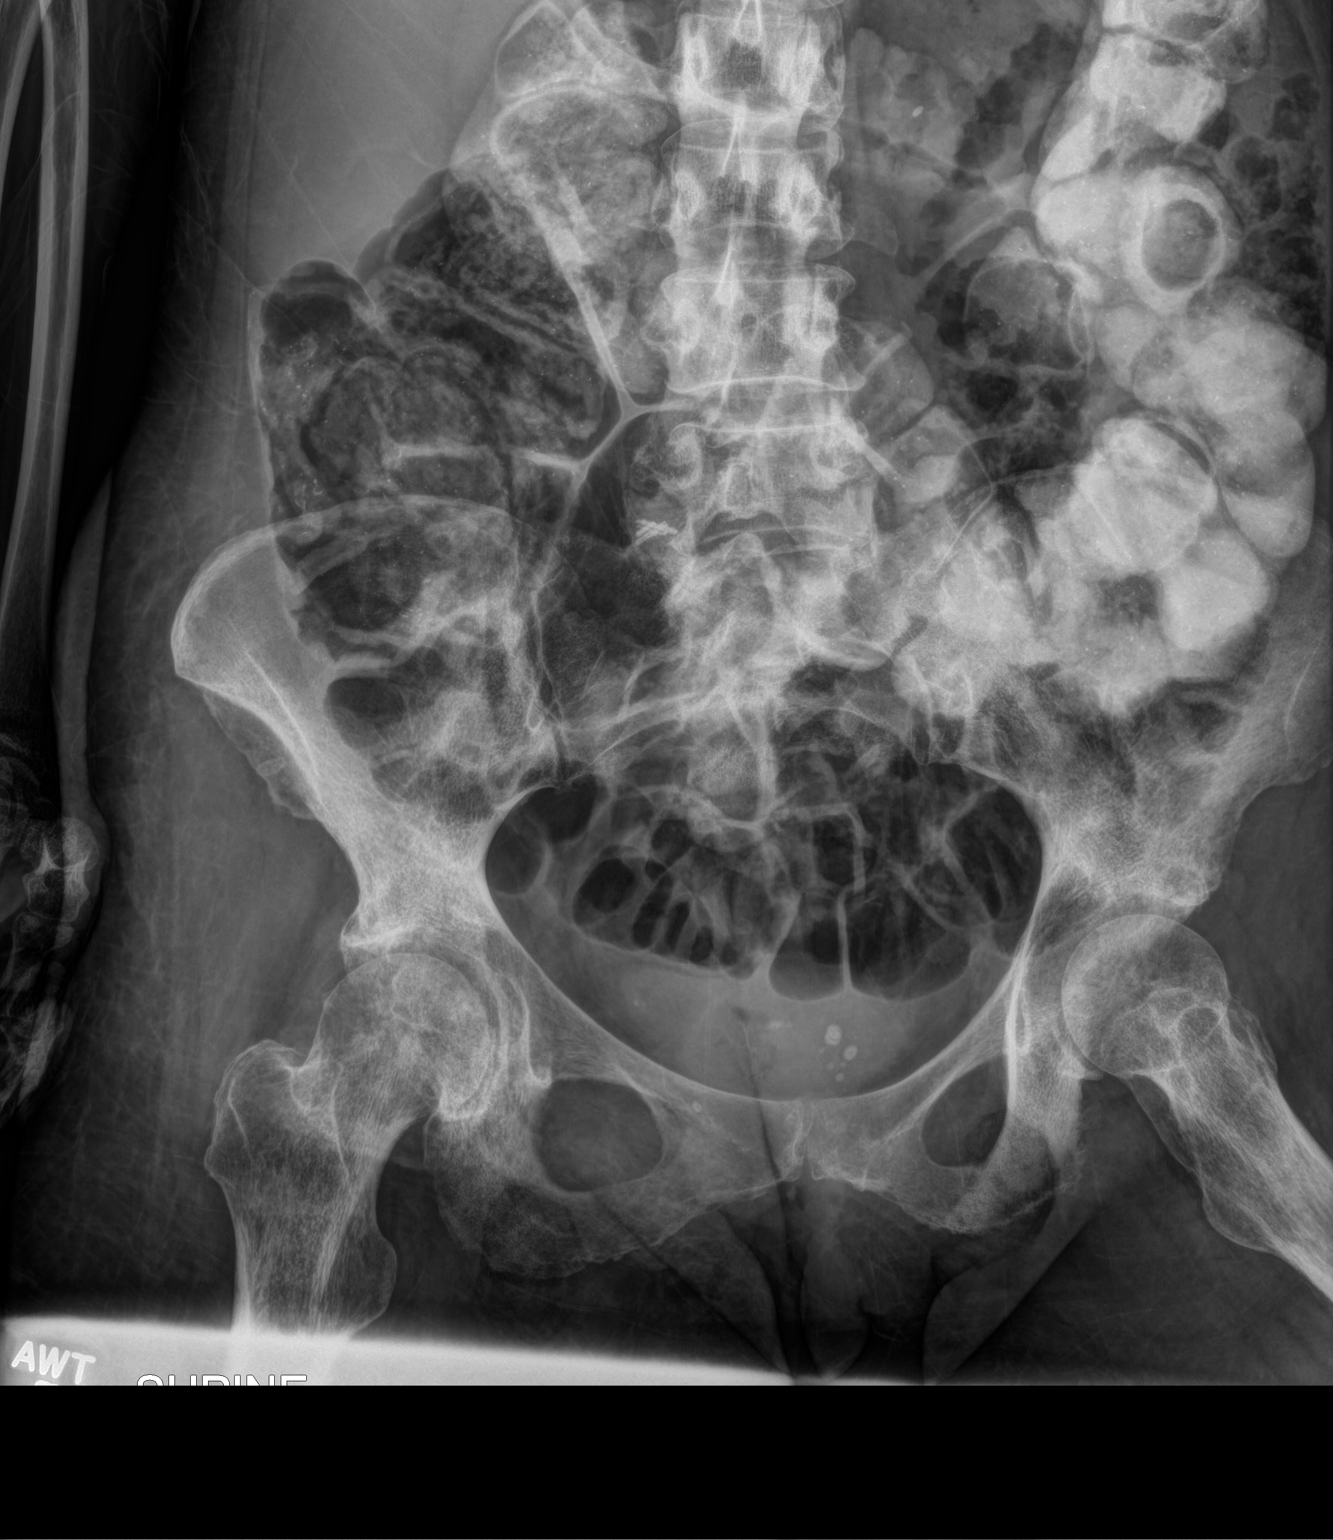

[2 of 2 positions shown; findings below may reference images not displayed]

FINDINGS: Feeding tube with the tip projecting over the stomach. There is
gaseous distention of small bowel and colon. There is oral contrast
material which has traversed the small bowel and is canal present
within the colon. There is no evidence of pneumoperitoneum, portal
venous gas or pneumatosis. There are no pathologic calcifications
along the expected course of the ureters. The osseous structures are
unremarkable.
IMPRESSION: 1. Feeding tube with the tip projecting over the stomach.
2. Gaseous distention of small bowel colon without bowel
obstruction. Contrast seen within the small bowel on the prior exam
of 11/16/2015 is not present within the colon.

## 2017-12-25 MED ORDER — POLYETHYLENE GLYCOL 3350 17 GM/SCOOP PO POWD: 17.0000 g | Freq: Every day | ORAL | Status: DC

## 2017-12-25 MED ORDER — MIDODRINE HCL 5 MG PO TABS
10.0000 mg | ORAL_TABLET | Freq: Three times a day (TID) | ORAL | Status: DC
  Administered 2017-12-25 – 2017-12-31 (×20): 10 mg
  Filled 2017-12-25 (×21): qty 2

## 2017-12-25 MED ORDER — FUROSEMIDE 10 MG/ML IJ SOLN
20.0000 mg | Freq: Two times a day (BID) | INTRAMUSCULAR | Status: DC
  Administered 2017-12-25 – 2017-12-30 (×11): 20 mg via INTRAVENOUS
  Filled 2017-12-25 (×11): qty 2

## 2017-12-25 MED ORDER — OXYCODONE HCL 5 MG/5ML PO SOLN
5.0000 mg | Freq: Three times a day (TID) | ORAL | Status: DC
  Administered 2017-12-25 – 2017-12-31 (×20): 5 mg
  Filled 2017-12-25 (×20): qty 5

## 2017-12-25 MED ORDER — IBUPROFEN 100 MG/5ML PO SUSP
400.0000 mg | Freq: Four times a day (QID) | ORAL | Status: DC | PRN
  Filled 2017-12-25: qty 20

## 2017-12-25 MED ORDER — OXYBUTYNIN CHLORIDE 5 MG PO TABS
5.0000 mg | ORAL_TABLET | Freq: Three times a day (TID) | ORAL | Status: DC | PRN
  Filled 2017-12-25: qty 1

## 2017-12-25 MED ORDER — POLYETHYLENE GLYCOL 3350 17 G PO PACK
17.0000 g | PACK | Freq: Every day | ORAL | Status: DC
  Administered 2017-12-25 – 2017-12-28 (×4): 17 g via ORAL
  Filled 2017-12-25 (×7): qty 1

## 2017-12-25 MED ORDER — ACETAMINOPHEN 160 MG/5ML PO SOLN
650.0000 mg | Freq: Four times a day (QID) | ORAL | Status: DC | PRN
  Administered 2017-12-27 – 2017-12-30 (×3): 650 mg
  Filled 2017-12-25 (×3): qty 20.3

## 2017-12-25 MED ORDER — LACTULOSE 10 GM/15ML PO SOLN
10.0000 g | Freq: Two times a day (BID) | ORAL | Status: DC
  Administered 2017-12-25 – 2017-12-27 (×5): 10 g via ORAL
  Filled 2017-12-25 (×5): qty 15

## 2017-12-25 NOTE — Progress Notes (Signed)
[place] TEAM [place]  PVX:[id number] DOB: [birth year] DOA: 12/16/2017 PCP: Briscoe Deutscher, DO    Brief Narrative:  47yo F admitted 12/16/2017 for unresponsiveness after several episodes of nausea and vomiting. She was found to have a glucose of 1637 and hypotension. Patient was resuscitated with IV fluids and insulin drip. Patient has a past medical history of functional quadriplegia due to C5-7 injury from a fall requiring tracheostomy, PEG tube and suprapubic catheter placement.  Significant Events: 11/27 admit 11/30 abx broadened due to Acinetobacter and Staph in cx 12/1 signif pulm atx  12/6 [facility] assumed care   Subjective: The patient will follow the examiner around the room.  She can communicate crudely when questioned.  She denies discomfort at present.  Her nurse reports intermittent episodes of tachypnea and apparent anxiety.  There is significant diffuse edema appreciable.  Assessment & Plan:  Acute on chronic hypoxemic respiratory failure status post tracheostomy Appears to have stabilized on ventilator support at this time  Acinetobacter and Staph multifocal pneumonia v/s colonization of the airway due to chronic trach Has completed 7 days of meropenem per PCCM  COPD Appears well compensated at this time  Chronic C5-C7 quadriplegia  Acute metabolic encephalopathy Possibility of narcotic withdrawal -resume low-dose scheduled narcotic  Uncontrolled DM2 w/ hyperglycemia, hyperosmolar nonketotic coma CBG currently reasonably controlled -follow closely  Urinary tract infection, complicated - chronic suprapubic catheter has completed a course of antibiotic therapy  Acute renal failure creatinine is slowly but consistently improving  Chronic diastolic heart failure significant anasarca appreciable which on exam appears most consistent with a low protein state -carefully attempt diuresis  Filed Weights   12/23/17 0459 12/24/17  0420 12/25/17 0500  Weight: 59.6 kg 61.1 kg 59.7 kg   Chronic pain -chronic opioid use As discussed above begin low-dose scheduled narcotic  Protein caloric malnutrition Nutrition following  DVT prophylaxis: Subcutaneous heparin Code Status: FULL CODE Family Communication: no family present at time of exam  Disposition Plan: ICU on vent   Consultants:  PCCM  Antimicrobials:  Levaquin 11/28 > 11/30 Meropenem 11/30 >12/6 Vancomycin 11/30 >12/1  Objective: Blood pressure 129/78, pulse (!) 113, temperature 100.1 F (37.8 C), temperature source Axillary, resp. rate (!) 40, height '4\' 9"'$  (1.448 m), weight 59.7 kg, last menstrual period 05/27/2016, SpO2 100 %.  Intake/Output Summary (Last 24 hours) at 12/25/2017 1001 Last data filed at 12/25/2017 0900 Gross per 24 hour  Intake 3707.64 ml  Output 2155 ml  Net 1552.64 ml   Filed Weights   12/23/17 0459 12/24/17 0420 12/25/17 0500  Weight: 59.6 kg 61.1 kg 59.7 kg    Examination: General: No acute respiratory distress on trach / vent  Lungs: mild bibasilar crackles - no wheezing  Cardiovascular: Regular rate and rhythm without murmur  Abdomen: NT/ND, soft, bs+, no mass, PEG insertion clean and dry  Extremities: 2+ diffuse edema   CBC: Recent Labs  Lab 12/23/17 0255 12/24/17 0500 12/25/17 0718  WBC 12.8* 13.1* 15.9*  HGB 8.1* 8.0* 8.1*  HCT 25.6* 24.5* 25.0*  MCV 89.2 87.5 86.8  PLT 283 354 482*   Basic Metabolic Panel: Recent Labs  Lab 12/23/17 0255 12/24/17 0500 12/25/17 0718  NA 145 140 139  K 3.6 3.9 3.5  CL 107 104 103  CO2 26 24 20*  GLUCOSE 146* 73 155*  BUN 36* 37* 36*  CREATININE 1.35* 1.16* 1.14*  CALCIUM 8.5* 8.3* 8.4*  MG 1.8 1.7 2.2  PHOS 3.5 4.9*  4.9*   GFR: Estimated Creatinine Clearance: 45.3 mL/min (A) (by C-G formula based on SCr of 1.14 mg/dL (H)).  Liver Function Tests: Recent Labs  Lab 12/20/17 0332  AST 246*  ALT 59*  ALKPHOS 250*  BILITOT 0.7  PROT 6.0*  ALBUMIN 1.6*     Recent Labs  Lab 12/21/17 1140  AMMONIA 36*    HbA1C: Hgb A1c MFr Bld  Date/Time Value Ref Range Status  12/17/2017 03:53 AM 11.5 (H) 4.8 - 5.6 % Final    Comment:    (NOTE) Pre diabetes:          5.7%-6.4% Diabetes:              >6.4% Glycemic control for   <7.0% adults with diabetes   12/16/2017 03:07 AM 11.8 (H) 4.8 - 5.6 % Final    Comment:    (NOTE)         Prediabetes: 5.7 - 6.4         Diabetes: >6.4         Glycemic control for adults with diabetes: <7.0     CBG: Recent Labs  Lab 12/24/17 1538 12/24/17 2018 12/25/17 0000 12/25/17 0355 12/25/17 0724  GLUCAP 86 102* 101* 102* 139*    Recent Results (from the past 240 hour(s))  Culture, blood (routine x 2)     Status: None   Collection Time: 12/16/17  2:57 AM  Result Value Ref Range Status   Specimen Description BLOOD LEFT HAND  Final   Special Requests   Final    BOTTLES DRAWN AEROBIC ONLY Blood Culture adequate volume   Culture   Final    NO GROWTH 5 DAYS Performed at [facility], [address], [place], [place] [address]    Report Status 12/21/2017 FINAL  Final  Culture, blood (routine x 2)     Status: None   Collection Time: 12/16/17  3:07 AM  Result Value Ref Range Status   Specimen Description BLOOD LEFT UPPER ARM  Final   Special Requests   Final    BOTTLES DRAWN AEROBIC AND ANAEROBIC Blood Culture adequate volume   Culture   Final    NO GROWTH 5 DAYS Performed at [facility], [place] [address], [place], [place] [address]    Report Status 12/21/2017 FINAL  Final  Urine Culture     Status: Abnormal   Collection Time: 12/16/17  5:24 AM  Result Value Ref Range Status   Specimen Description URINE, RANDOM  Final   Special Requests   Final    NONE Performed at [facility], [place] [address], [place], [place] [address]    Culture 80,000 COLONIES/mL YEAST (A)  Final   Report Status 12/17/2017 FINAL  Final  MRSA PCR Screening     Status: None   Collection Time: 12/16/17   7:03 AM  Result Value Ref Range Status   MRSA by PCR NEGATIVE NEGATIVE Final    Comment:        The GeneXpert MRSA Assay (FDA approved for NASAL specimens only), is one component of a comprehensive MRSA colonization surveillance program. It is not intended to diagnose MRSA infection nor to guide or monitor treatment for MRSA infections. Performed at [facility], [place] [address] [place], [place] [address]   Culture, respiratory (non-expectorated)     Status: None   Collection Time: 12/16/17 11:42 AM  Result Value Ref Range Status   Specimen Description TRACHEAL ASPIRATE  Final  Special Requests NONE  Final   Gram Stain   Final    ABUNDANT WBC PRESENT, PREDOMINANTLY PMN MODERATE GRAM POSITIVE COCCI IN PAIRS IN CLUSTERS FEW GRAM NEGATIVE COCCOBACILLI FEW GRAM POSITIVE RODS RARE BUDDING YEAST SEEN Performed at [facility], [place] [address], [place], [place] [address]    Culture   Final    ABUNDANT ACINETOBACTER CALCOACETICUS/BAUMANNII COMPLEX ABUNDANT STAPHYLOCOCCUS AUREUS    Report Status 12/19/2017 FINAL  Final   Organism ID, Bacteria ACINETOBACTER CALCOACETICUS/BAUMANNII COMPLEX  Final   Organism ID, Bacteria STAPHYLOCOCCUS AUREUS  Final      Susceptibility   Acinetobacter calcoaceticus/baumannii complex - MIC*    CIPROFLOXACIN <=0.5 SENSITIVE Sensitive     GENTAMICIN <=0.5 SENSITIVE Sensitive     TRIMETH/SULFA <=10 SENSITIVE Sensitive     AMPICILLIN/SULBACTAM RESISTANT Resistant     CEFEPIME RESISTANT Resistant     CEFTAZIDIME RESISTANT Resistant     CEFTRIAXONE RESISTANT Resistant     IMIPENEM SENSITIVE Sensitive     PIP/TAZO RESISTANT Resistant     * ABUNDANT ACINETOBACTER CALCOACETICUS/BAUMANNII COMPLEX   Staphylococcus aureus - MIC*    CIPROFLOXACIN <=0.5 SENSITIVE Sensitive     ERYTHROMYCIN RESISTANT Resistant     GENTAMICIN <=0.5 SENSITIVE Sensitive     OXACILLIN 0.5 SENSITIVE Sensitive     TETRACYCLINE <=1 SENSITIVE Sensitive     VANCOMYCIN 1  SENSITIVE Sensitive     TRIMETH/SULFA <=10 SENSITIVE Sensitive     CLINDAMYCIN RESISTANT Resistant     RIFAMPIN <=0.5 SENSITIVE Sensitive     Inducible Clindamycin POSITIVE Resistant     * ABUNDANT STAPHYLOCOCCUS AUREUS     Scheduled Meds: . arformoterol  15 mcg Nebulization BID  . baclofen  10 mg Per Tube TID  . budesonide (PULMICORT) nebulizer solution  0.5 mg Nebulization BID  . chlorhexidine gluconate (MEDLINE KIT)  15 mL Mouth Rinse BID  . famotidine  20 mg Per Tube QHS  . feeding supplement (PRO-STAT SUGAR FREE 64)  30 mL Oral Daily  . free water  200 mL Per Tube Q4H  . furosemide  20 mg Intravenous Q12H  . Gerhardt's butt cream   Topical QID  . heparin  5,000 Units Subcutaneous Q8H  . insulin aspart  0-20 Units Subcutaneous Q4H  . insulin detemir  15 Units Subcutaneous BID  . ipratropium  0.5 mg Nebulization Q6H  . levalbuterol  1.25 mg Nebulization Q6H  . mouth rinse  15 mL Mouth Rinse 10 times per day  . midodrine  10 mg Oral TID WC   Continuous Infusions: . sodium chloride 10 mL/hr at 12/25/17 0900  . feeding supplement (VITAL AF 1.2 CAL) 45 mL/hr at 12/25/17 0900  . meropenem (MERREM) IV 1 g (12/25/17 0949)     LOS: 9 days   Cherene Altes, MD [facility] Office  [phone] Pager - Text Page per Amion  If 7PM-7AM, please contact night-coverage per Amion 12/25/2017, 10:01 AM

## 2017-12-26 LAB — CBC
HCT: 24.5 % — ABNORMAL LOW (ref 36.0–46.0)
Hemoglobin: 7.8 g/dL — ABNORMAL LOW (ref 12.0–15.0)
MCH: 28.4 pg (ref 26.0–34.0)
MCHC: 31.8 g/dL (ref 30.0–36.0)
MCV: 89.1 fL (ref 80.0–100.0)
Platelets: 509 10*3/uL — ABNORMAL HIGH (ref 150–400)
RBC: 2.75 MIL/uL — ABNORMAL LOW (ref 3.87–5.11)
RDW: 15.3 % (ref 11.5–15.5)
WBC: 12.4 10*3/uL — ABNORMAL HIGH (ref 4.0–10.5)
nRBC: 0 % (ref 0.0–0.2)

## 2017-12-26 LAB — COMPREHENSIVE METABOLIC PANEL
ALK PHOS: 327 U/L — AB (ref 38–126)
ALT: 43 U/L (ref 0–44)
AST: 50 U/L — ABNORMAL HIGH (ref 15–41)
Albumin: 1.8 g/dL — ABNORMAL LOW (ref 3.5–5.0)
Anion gap: 14 (ref 5–15)
BUN: 33 mg/dL — ABNORMAL HIGH (ref 6–20)
CALCIUM: 8.5 mg/dL — AB (ref 8.9–10.3)
CO2: 21 mmol/L — ABNORMAL LOW (ref 22–32)
Chloride: 104 mmol/L (ref 98–111)
Creatinine, Ser: 0.99 mg/dL (ref 0.44–1.00)
GFR calc Af Amer: >60 mL/min (ref >60)
GFR calc non Af Amer: >60 mL/min (ref >60)
GLUCOSE: 182 mg/dL — AB (ref 70–99)
Potassium: 3.1 mmol/L — ABNORMAL LOW (ref 3.5–5.1)
Sodium: 139 mmol/L (ref 135–145)
Total Bilirubin: 0.4 mg/dL (ref 0.3–1.2)
Total Protein: 5.7 g/dL — ABNORMAL LOW (ref 6.5–8.1)

## 2017-12-26 LAB — GLUCOSE, CAPILLARY
GLUCOSE-CAPILLARY: 175 mg/dL — AB (ref 70–99)
Glucose-Capillary: 103 mg/dL — ABNORMAL HIGH (ref 70–99)
Glucose-Capillary: 119 mg/dL — ABNORMAL HIGH (ref 70–99)
Glucose-Capillary: 159 mg/dL — ABNORMAL HIGH (ref 70–99)
Glucose-Capillary: 188 mg/dL — ABNORMAL HIGH (ref 70–99)
Glucose-Capillary: 191 mg/dL — ABNORMAL HIGH (ref 70–99)

## 2017-12-26 LAB — MAGNESIUM: MAGNESIUM: 2 mg/dL (ref 1.7–2.4)

## 2017-12-26 LAB — AMMONIA: Ammonia: 29 umol/L (ref 9–35)

## 2017-12-26 IMAGING — DX DG ABDOMEN 1V
1 series · 1 of 1 positions shown · non-contrast
Comparison: 11/21/2015 abdomen radiograph

CLINICAL DATA: 45 y/o  F; ileus.

EXAM:
ABDOMEN - 1 VIEW

[abdomen kub]
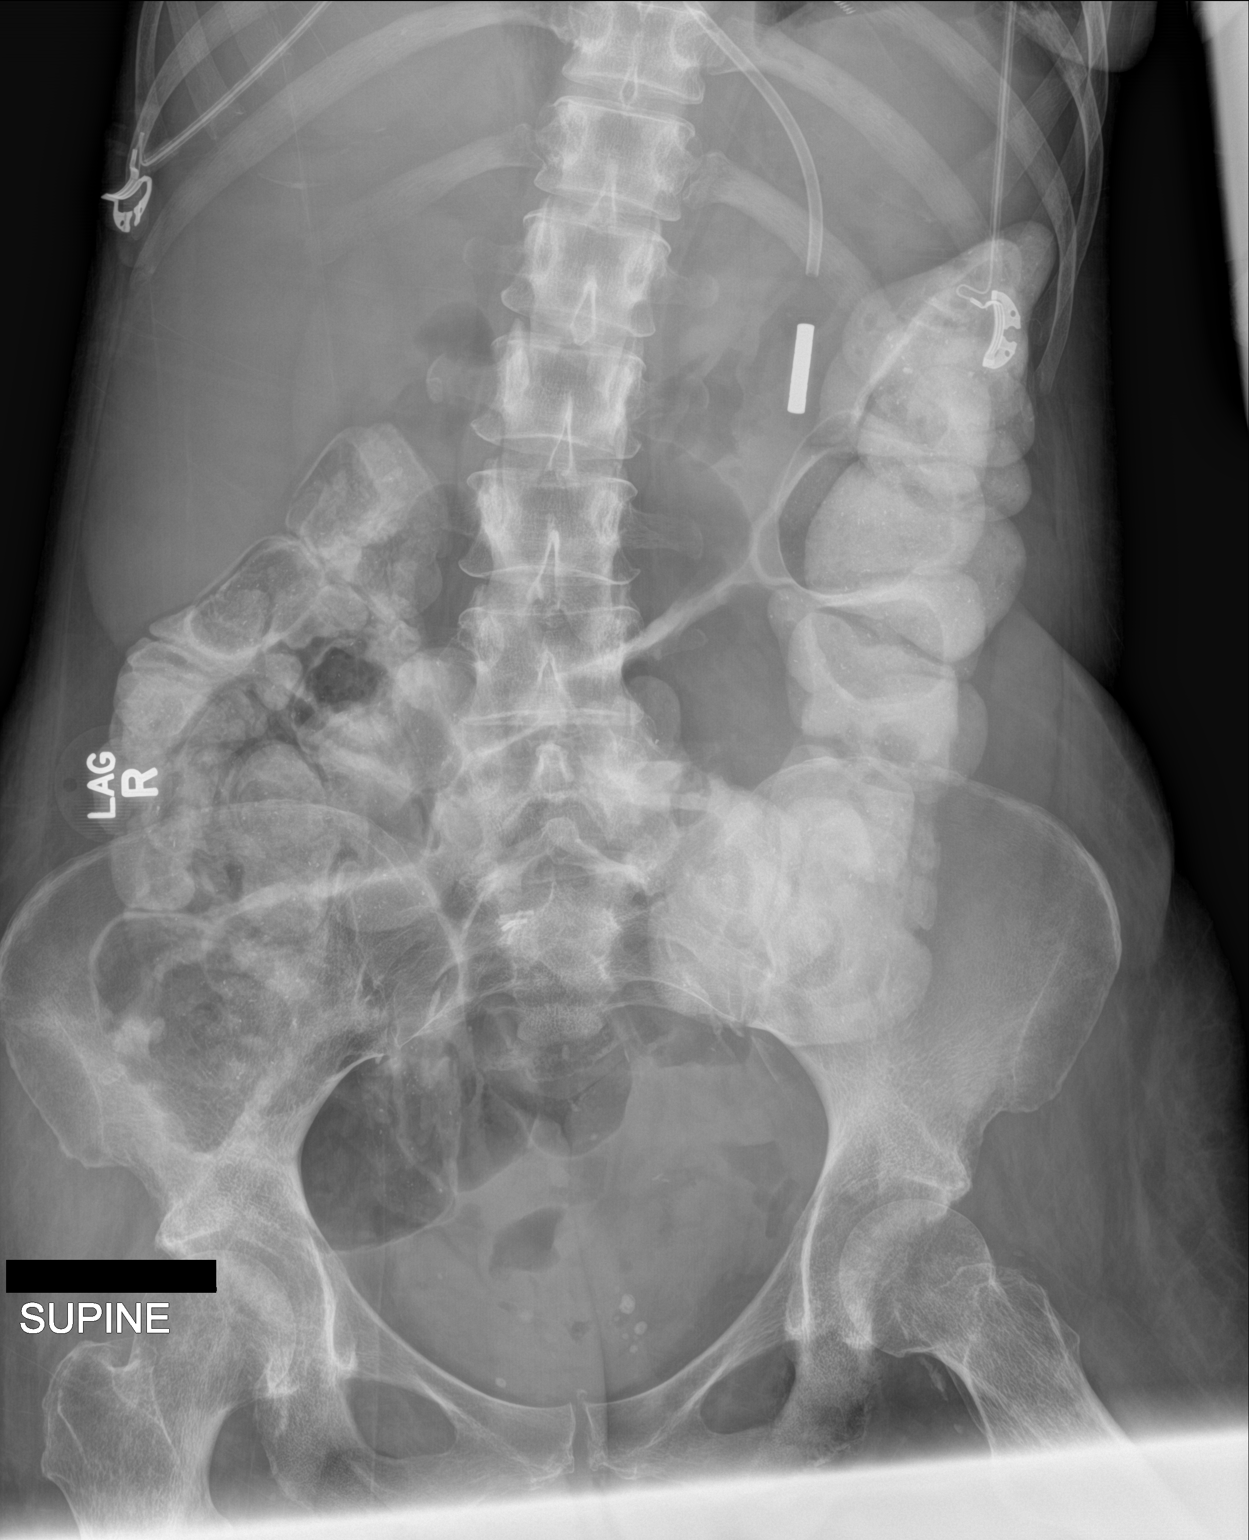

[1 of 1 positions shown; findings below may reference images not displayed]

FINDINGS: Enteric tube tip projects over mid stomach. Retained contrast within
the colon. Persistent dilated loops of small bowel mildly improved.
No acute osseous abnormality is evident.
IMPRESSION: Persistently dilated loops of small bowel mildly improved and
retained contrast within the colon.

By: Attila Chummy Eszik M.D.

## 2017-12-26 MED ORDER — POTASSIUM CHLORIDE CRYS ER 20 MEQ PO TBCR
40.0000 meq | EXTENDED_RELEASE_TABLET | Freq: Once | ORAL | Status: AC
  Administered 2017-12-26: 40 meq via ORAL
  Filled 2017-12-26: qty 2

## 2017-12-26 MED ORDER — POTASSIUM CHLORIDE 20 MEQ/15ML (10%) PO SOLN
40.0000 meq | Freq: Two times a day (BID) | ORAL | Status: AC
  Administered 2017-12-26 (×2): 40 meq
  Filled 2017-12-26 (×2): qty 30

## 2017-12-26 MED ORDER — FREE WATER
200.0000 mL | Freq: Three times a day (TID) | Status: DC
  Administered 2017-12-26 – 2017-12-27 (×4): 200 mL

## 2017-12-26 NOTE — Progress Notes (Signed)
[place] TEAM [place]  OIZ:[id number] DOB: Sep 16, 1970 DOA: 12/16/2017 PCP: Briscoe Deutscher, DO    Brief Narrative:  47yo F admitted 12/16/2017 for unresponsiveness after several episodes of nausea and vomiting. She was found to have a glucose of 1637 and hypotension. Patient was resuscitated with IV fluids and insulin drip. 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  Significant Events: 11/27 admit 11/30 abx broadened due to Acinetobacter and Staph in cx 12/1 signif pulm atx  12/6 [facility] assumed care   Subjective: The patient is much more calm and much more alert today.  She tells me she is not in pain.  She indicates that she is not experiencing nausea or vomiting.  There is no evidence of respiratory distress.  She is improving but not yet back to her baseline.  Assessment & Plan:  Acute on chronic hypoxemic respiratory failure status post tracheostomy Appears to have stabilized on ventilator support at this time  Acinetobacter and Staph multifocal pneumonia v/s colonization of the airway due to chronic trach Has completed 7 days of meropenem per PCCM  COPD well compensated at this time  Chronic C5-C7 quadriplegia Reportedly lives at home with assistance  Acute metabolic encephalopathy Mental status has stabilized with addition of low-dose scheduled narcotic  Severely uncontrolled DM2 w/ hyperglycemia, hyperosmolar nonketotic coma CBG controlled at this time  Urinary tract infection, complicated - chronic suprapubic catheter has completed a course of antibiotic therapy  Acute renal failure creatinine has now normalized  Chronic diastolic heart failure anasarca appears most consistent with a low protein state -is improving slowly with gentle diuresis  Filed Weights   12/24/17 0420 12/25/17 0500 12/26/17 0310    Weight: 61.1 kg 59.7 kg 57.9 kg   Chronic pain -chronic opioid use Continue low-dose scheduled narcotic  Protein caloric malnutrition Nutrition following  DVT prophylaxis: Subcutaneous heparin Code Status: FULL CODE Family Communication: no family present at time of exam  Disposition Plan: ICU on vent -possible discharge to home 24-48 hours  Consultants:  PCCM  Antimicrobials:  Levaquin 11/28 > 11/30 Meropenem 11/30 >12/6 Vancomycin 11/30 >12/1  Objective: Blood pressure 109/71, pulse 85, temperature (!) 97.2 F (36.2 C), temperature source Axillary, resp. rate (!) 23, height _0  (1.448 m), weight 57.9 kg, last menstrual period 05/27/2016, SpO2 100 %.  Intake/Output Summary (Last 24 hours) at 12/26/2017 1004 Last data filed at 12/26/2017 0700 Gross per 24 hour  Intake 1712.51 ml  Output 2700 ml  Net -987.49 ml   Filed Weights   12/24/17 0420 12/25/17 0500 12/26/17 0310  Weight: 61.1 kg 59.7 kg 57.9 kg    Examination: General: No acute respiratory distress - on trach / vent  Lungs: Clear to auscultation without wheezing Cardiovascular: Regular rate and rhythm without murmur or rub Abdomen: NT/ND, soft, bs+, no mass, PEG insertion clean and dry  Extremities: 2 diffuse edema   CBC: Recent Labs  Lab 12/24/17 0500 12/25/17 0718 12/26/17 0311  WBC 13.1* 15.9* 12.4*  HGB 8.0* 8.1* 7.8*  HCT 24.5* 25.0* 24.5*  MCV 87.5 86.8 89.1  PLT 354 448* 338*   Basic Metabolic Panel: Recent Labs  Lab 12/23/17 0255 12/24/17 0500 12/25/17 0718 12/26/17 0311  NA 145 140 139 139  K 3.6 3.9 3.5 3.1*  CL 107 104 103 104  CO2 26 24 20* 21*  GLUCOSE 146* 73 155*  182*  BUN 36* 37* 36* 33*  CREATININE 1.35* 1.16* 1.14* 0.99  CALCIUM 8.5* 8.3* 8.4* 8.5*  MG 1.8 1.7 2.2 2.0  PHOS 3.5 4.9* 4.9*  --    GFR: Estimated Creatinine Clearance: 51.3 mL/min (by C-G formula based on SCr of 0.99 mg/dL).  Liver Function Tests: Recent Labs  Lab 12/20/17 0332 12/26/17 0311  AST  246* 50*  ALT 59* 43  ALKPHOS 250* 327*  BILITOT 0.7 0.4  PROT 6.0* 5.7*  ALBUMIN 1.6* 1.8*    Recent Labs  Lab 12/21/17 1140 12/26/17 0311  AMMONIA 36* 29    HbA1C: Hgb A1c MFr Bld  Date/Time Value Ref Range Status  12/17/2017 03:53 AM 11.5 (H) 4.8 - 5.6 % Final    Comment:    (NOTE) Pre diabetes:          5.7%-6.4% Diabetes:              >6.4% Glycemic control for   <7.0% adults with diabetes   12/16/2017 03:07 AM 11.8 (H) 4.8 - 5.6 % Final    Comment:    (NOTE)         Prediabetes: 5.7 - 6.4         Diabetes: >6.4         Glycemic control for adults with diabetes: <7.0     CBG: Recent Labs  Lab 12/25/17 1525 12/25/17 2014 12/26/17 0029 12/26/17 0344 12/26/17 0740  GLUCAP 159* 206* 159* 175* 103*    Recent Results (from the past 240 hour(s))  Culture, respiratory (non-expectorated)     Status: None   Collection Time: 12/16/17 11:42 AM  Result Value Ref Range Status   Specimen Description TRACHEAL ASPIRATE  Final   Special Requests NONE  Final   Gram Stain   Final    ABUNDANT WBC PRESENT, PREDOMINANTLY PMN MODERATE GRAM POSITIVE COCCI IN PAIRS IN CLUSTERS FEW GRAM NEGATIVE COCCOBACILLI FEW GRAM POSITIVE RODS RARE BUDDING YEAST SEEN Performed at [facility], [place] [address], [place], [place] [address]    Culture   Final    ABUNDANT ACINETOBACTER CALCOACETICUS/BAUMANNII COMPLEX ABUNDANT STAPHYLOCOCCUS AUREUS    Report Status 12/19/2017 FINAL  Final   Organism ID, Bacteria ACINETOBACTER CALCOACETICUS/BAUMANNII COMPLEX  Final   Organism ID, Bacteria STAPHYLOCOCCUS AUREUS  Final      Susceptibility   Acinetobacter calcoaceticus/baumannii complex - MIC*    CIPROFLOXACIN <=0.5 SENSITIVE Sensitive     GENTAMICIN <=0.5 SENSITIVE Sensitive     TRIMETH/SULFA <=10 SENSITIVE Sensitive     AMPICILLIN/SULBACTAM RESISTANT Resistant     CEFEPIME RESISTANT Resistant     CEFTAZIDIME RESISTANT Resistant     CEFTRIAXONE RESISTANT Resistant     IMIPENEM  SENSITIVE Sensitive     PIP/TAZO RESISTANT Resistant     * ABUNDANT ACINETOBACTER CALCOACETICUS/BAUMANNII COMPLEX   Staphylococcus aureus - MIC*    CIPROFLOXACIN <=0.5 SENSITIVE Sensitive     ERYTHROMYCIN RESISTANT Resistant     GENTAMICIN <=0.5 SENSITIVE Sensitive     OXACILLIN 0.5 SENSITIVE Sensitive     TETRACYCLINE <=1 SENSITIVE Sensitive     VANCOMYCIN 1 SENSITIVE Sensitive     TRIMETH/SULFA <=10 SENSITIVE Sensitive     CLINDAMYCIN RESISTANT Resistant     RIFAMPIN <=0.5 SENSITIVE Sensitive     Inducible Clindamycin POSITIVE Resistant     * ABUNDANT STAPHYLOCOCCUS AUREUS     Scheduled Meds: . arformoterol  15 mcg Nebulization BID  . baclofen  10 mg Per Tube TID  . budesonide (PULMICORT)  nebulizer solution  0.5 mg Nebulization BID  . chlorhexidine gluconate (MEDLINE KIT)  15 mL Mouth Rinse BID  . famotidine  20 mg Per Tube QHS  . feeding supplement (PRO-STAT SUGAR FREE 64)  30 mL Oral Daily  . free water  200 mL Per Tube Q4H  . furosemide  20 mg Intravenous Q12H  . Gerhardt's butt cream   Topical QID  . heparin  5,000 Units Subcutaneous Q8H  . insulin aspart  0-20 Units Subcutaneous Q4H  . insulin detemir  15 Units Subcutaneous BID  . ipratropium  0.5 mg Nebulization Q6H  . lactulose  10 g Oral BID  . levalbuterol  1.25 mg Nebulization Q6H  . mouth rinse  15 mL Mouth Rinse 10 times per day  . midodrine  10 mg Per Tube TID WC  . oxyCODONE  5 mg Per Tube TID  . polyethylene glycol  17 g Oral Daily   Continuous Infusions: . sodium chloride 10 mL/hr at 12/26/17 0700  . feeding supplement (VITAL AF 1.2 CAL) 45 mL/hr at 12/25/17 0900     LOS: 10 days   Cherene Altes, MD [facility] Office  [phone] Pager - Text Page per Amion  If 7PM-7AM, please contact night-coverage per Amion 12/26/2017, 10:04 AM

## 2017-12-27 LAB — BASIC METABOLIC PANEL
Anion gap: 13 (ref 5–15)
BUN: 29 mg/dL — ABNORMAL HIGH (ref 6–20)
CO2: 22 mmol/L (ref 22–32)
Calcium: 8.7 mg/dL — ABNORMAL LOW (ref 8.9–10.3)
Chloride: 104 mmol/L (ref 98–111)
Creatinine, Ser: 0.84 mg/dL (ref 0.44–1.00)
GFR calc Af Amer: >60 mL/min (ref >60)
GFR calc non Af Amer: >60 mL/min (ref >60)
Glucose, Bld: 190 mg/dL — ABNORMAL HIGH (ref 70–99)
Potassium: 4.5 mmol/L (ref 3.5–5.1)
Sodium: 139 mmol/L (ref 135–145)

## 2017-12-27 LAB — GLUCOSE, CAPILLARY
Glucose-Capillary: 111 mg/dL — ABNORMAL HIGH (ref 70–99)
Glucose-Capillary: 144 mg/dL — ABNORMAL HIGH (ref 70–99)
Glucose-Capillary: 161 mg/dL — ABNORMAL HIGH (ref 70–99)
Glucose-Capillary: 166 mg/dL — ABNORMAL HIGH (ref 70–99)
Glucose-Capillary: 177 mg/dL — ABNORMAL HIGH (ref 70–99)
Glucose-Capillary: 207 mg/dL — ABNORMAL HIGH (ref 70–99)

## 2017-12-27 LAB — CBC
HCT: 23.6 % — ABNORMAL LOW (ref 36.0–46.0)
Hemoglobin: 7.4 g/dL — ABNORMAL LOW (ref 12.0–15.0)
MCH: 28.4 pg (ref 26.0–34.0)
MCHC: 31.4 g/dL (ref 30.0–36.0)
MCV: 90.4 fL (ref 80.0–100.0)
Platelets: 536 10*3/uL — ABNORMAL HIGH (ref 150–400)
RBC: 2.61 MIL/uL — ABNORMAL LOW (ref 3.87–5.11)
RDW: 16.4 % — AB (ref 11.5–15.5)
WBC: 11.8 10*3/uL — ABNORMAL HIGH (ref 4.0–10.5)
nRBC: 0 % (ref 0.0–0.2)

## 2017-12-27 IMAGING — XA IR PERC PLACEMENT GASTROSTOMY
4 series · 12 of 15 positions shown · non-contrast
Comparison: none

INDICATION: 45-year-old female with a history of dysphagia.

[Series 2: fl - angio · 3 of 24 frames shown (1 of 3)]
[frame 4/24]
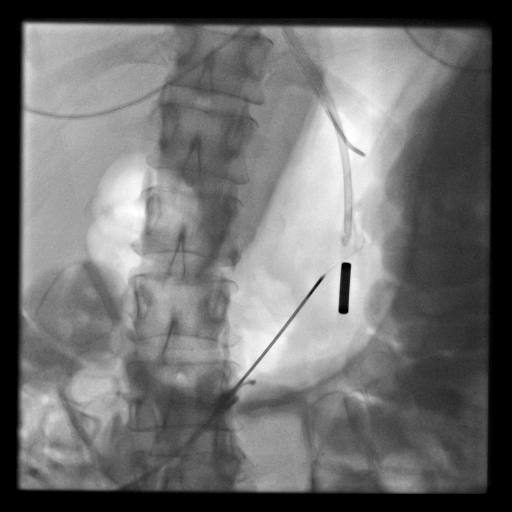
[frame 9/24]
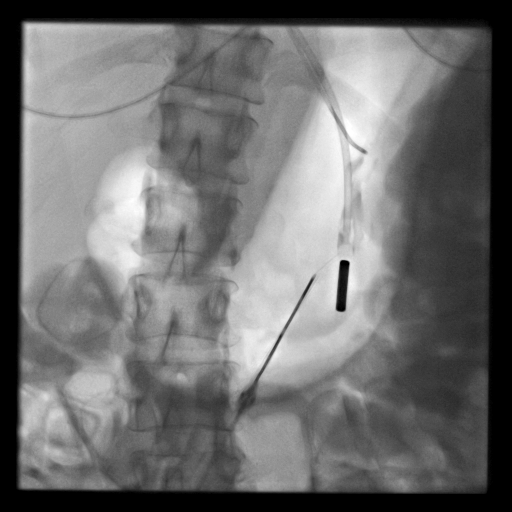
[frame 21/24]
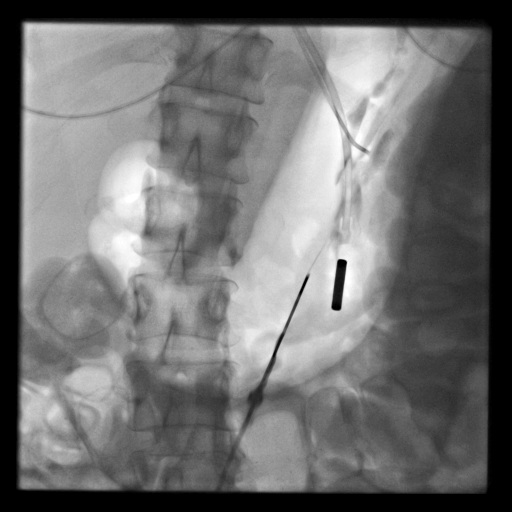

[Series 3: fl - angio · 3 of 29 frames shown (2 of 3)]
[frame 5/29]
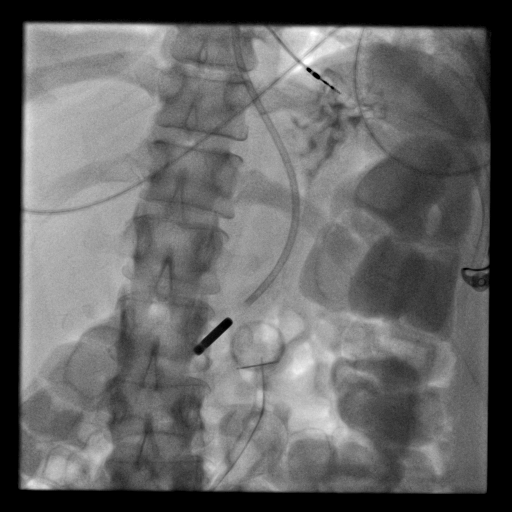
[frame 13/29]
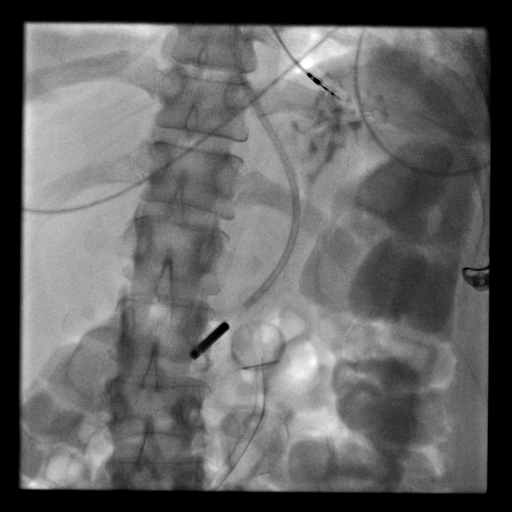
[frame 15/29]
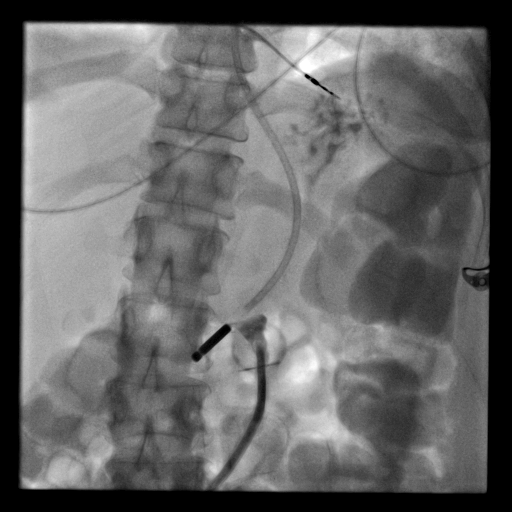

[Series 4: fl - angio · 4 of 14 frames shown (3 of 3)]
[frame 3/14]
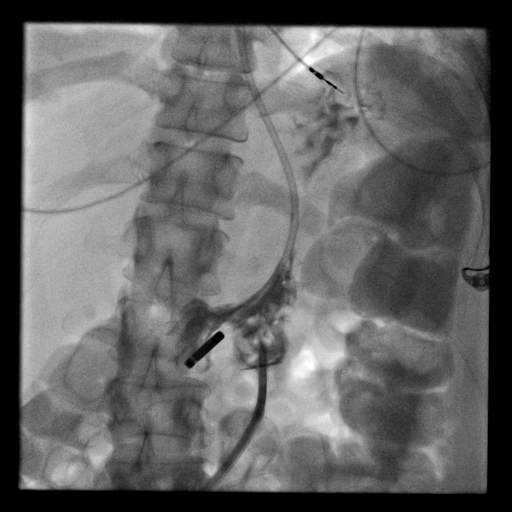
[frame 5/14]
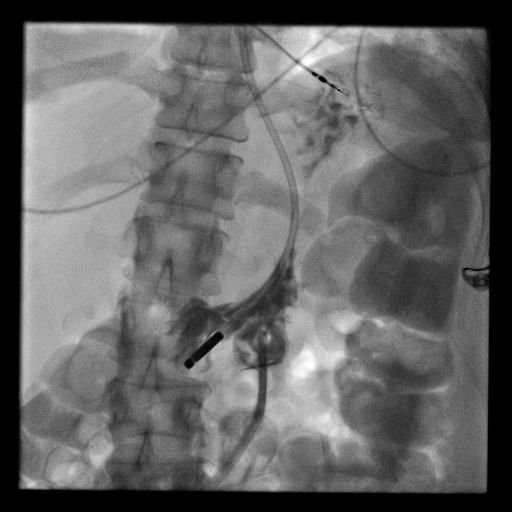
[frame 8/14]
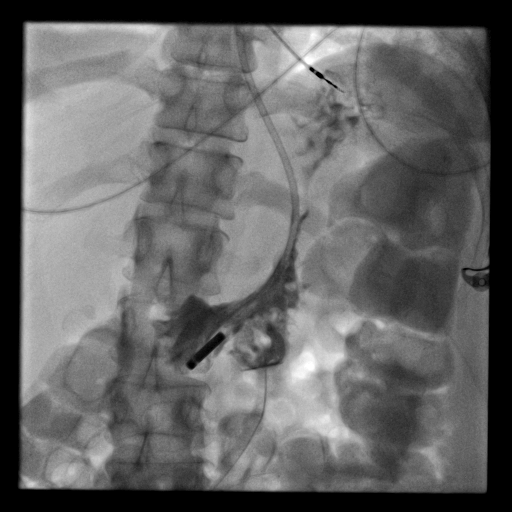
[frame 12/14]
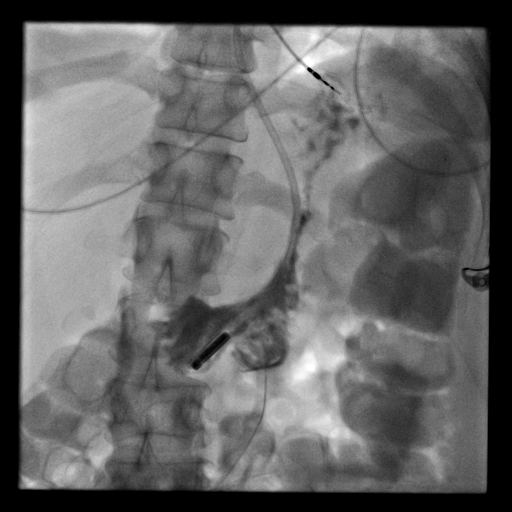

[Series 300: line placements · 2 of 3 slices shown]
[im 2/3]
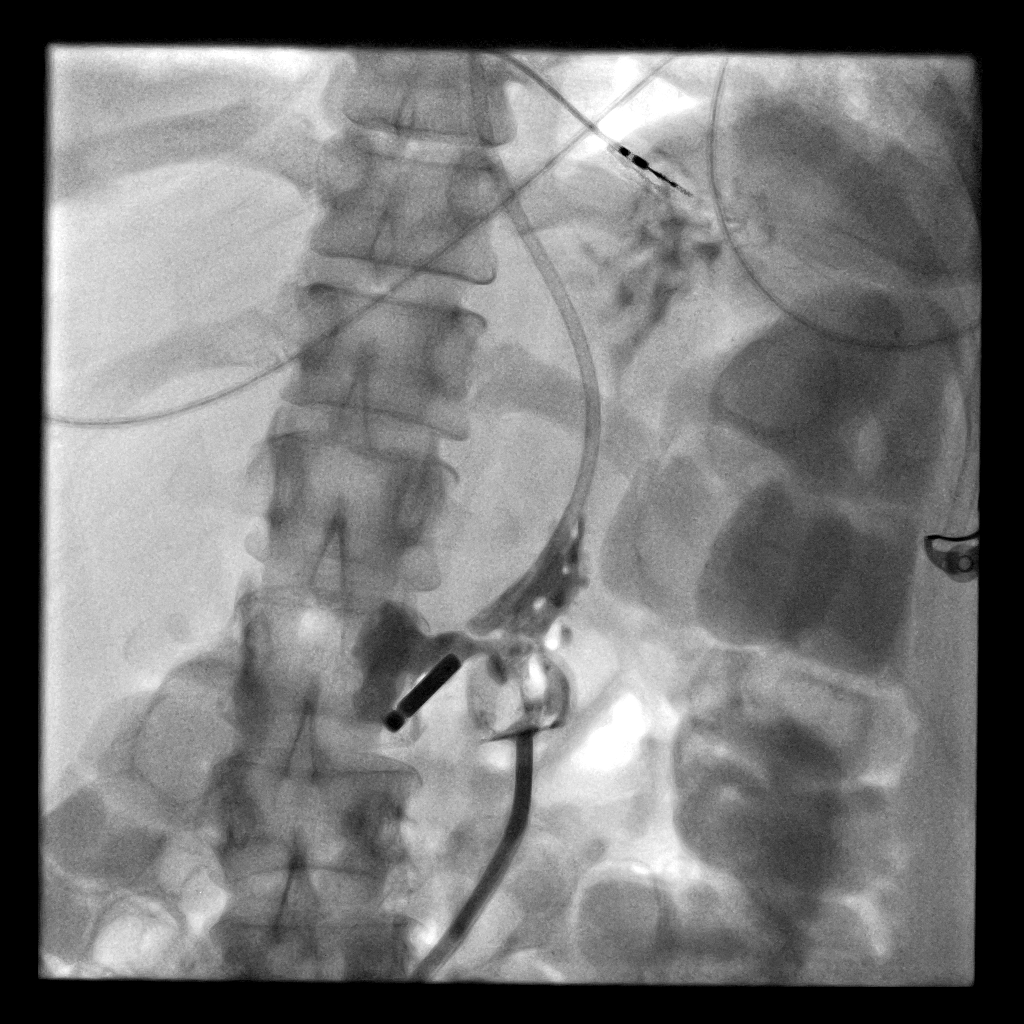
[im 3/3]
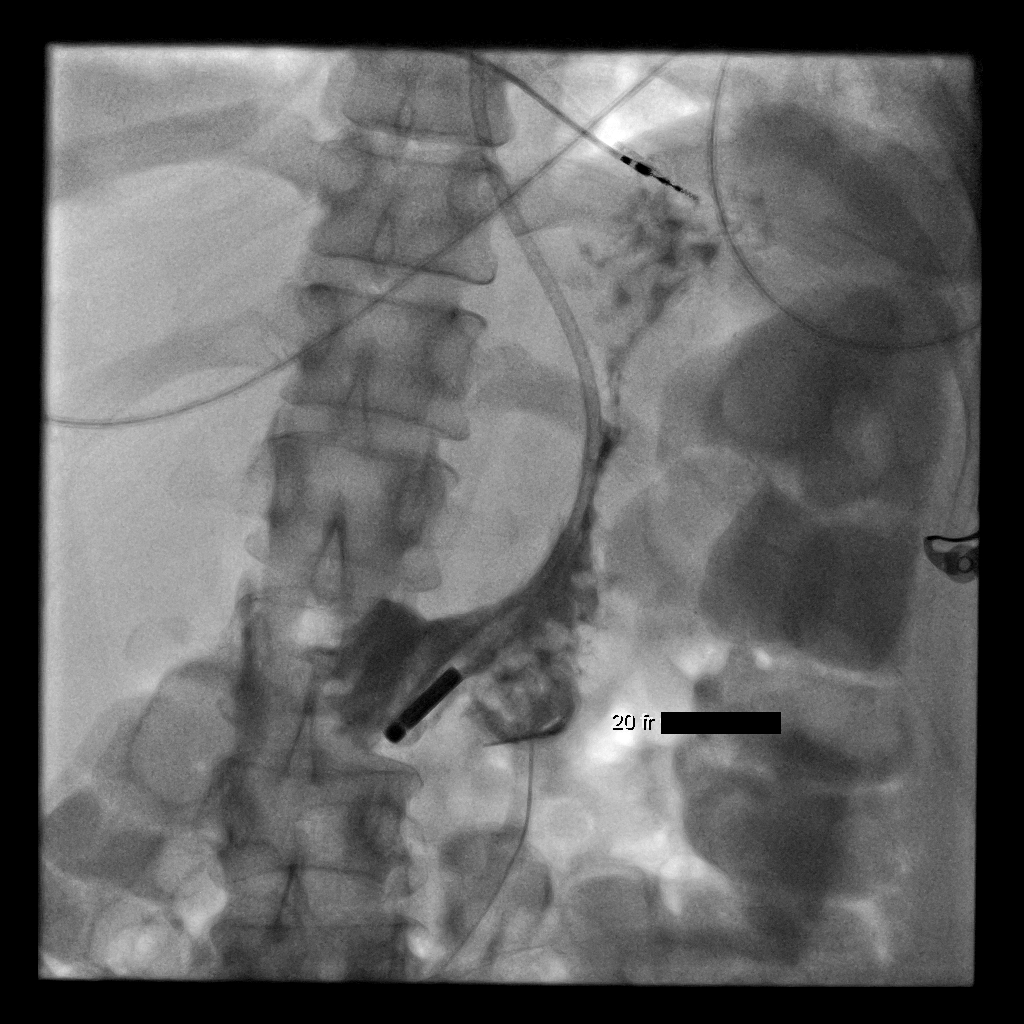

[12 of 15 positions shown; findings below may reference images not displayed]

EXAM:
PERC PLACEMENT GASTROSTOMY

MEDICATIONS:
Vancomycin 1 gm IV; Antibiotics were administered within 1 hour of
the procedure.

ANESTHESIA/SEDATION:
Versed 1.0 mg IV; Fentanyl 50 mcg IV

Moderate Sedation Time:  13

The patient was continuously monitored during the procedure by the
interventional radiology nurse under my direct supervision.

CONTRAST:  10mL [id number] IOPAMIDOL ([id number]) INJECTION 61% -
administered into the gastric lumen.

FLUOROSCOPY TIME:  Fluoroscopy Time: 3 minutes 12 seconds (10 mGy).

COMPLICATIONS:
None

PROCEDURE:
Informed written consent was obtained from the patient's family
after a thorough discussion of the procedural risks, benefits and
alternatives. All questions were addressed. Maximal Sterile Barrier
Technique was utilized including caps, mask, sterile gowns, sterile
gloves, sterile drape, hand hygiene and skin antiseptic. A timeout
was performed prior to the initiation of the procedure.

The procedure, risks, benefits, and alternatives were explained to
the patient. Questions regarding the procedure were encouraged and
answered. The patient understands and consents to the procedure.

The epigastrium was prepped with Betadine in a sterile fashion, and
a sterile drape was applied covering the operative field. A sterile
gown and sterile gloves were used for the procedure.

A 5-French orogastric tube is placed under fluoroscopic guidance.
Scout imaging of the abdomen confirms barium within the transverse
colon.

The stomach was distended with gas. Under fluoroscopic guidance, an
18 gauge needle was utilized to puncture the anterior wall of the
body of the stomach. An Amplatz wire was advanced through the needle
passing a T fastener into the lumen of the stomach. The T fastener
was secured for gastropexy. A 9-French sheath was inserted.

A snare was advanced through the 9-French sheath. Amun Davood was
advanced through the orogastric tube. It was snared then pulled out
the oral cavity, pulling the snare, as well. The leading edge of the
gastrostomy was attached to the snare. It was then pulled down the
esophagus and out the percutaneous site. It was secured in place.
Contrast was injected. No complication
IMPRESSION: Status post fluoroscopic placed percutaneous gastrostomy tube, with
20 French pull-through.

## 2017-12-27 IMAGING — DX DG ABDOMEN 1V
1 series · 1 of 1 positions shown · non-contrast
Comparison: 11/22/2015.  11/21/2015.

CLINICAL DATA: Ileus.

EXAM:
ABDOMEN - 1 VIEW

[abdomen kub]
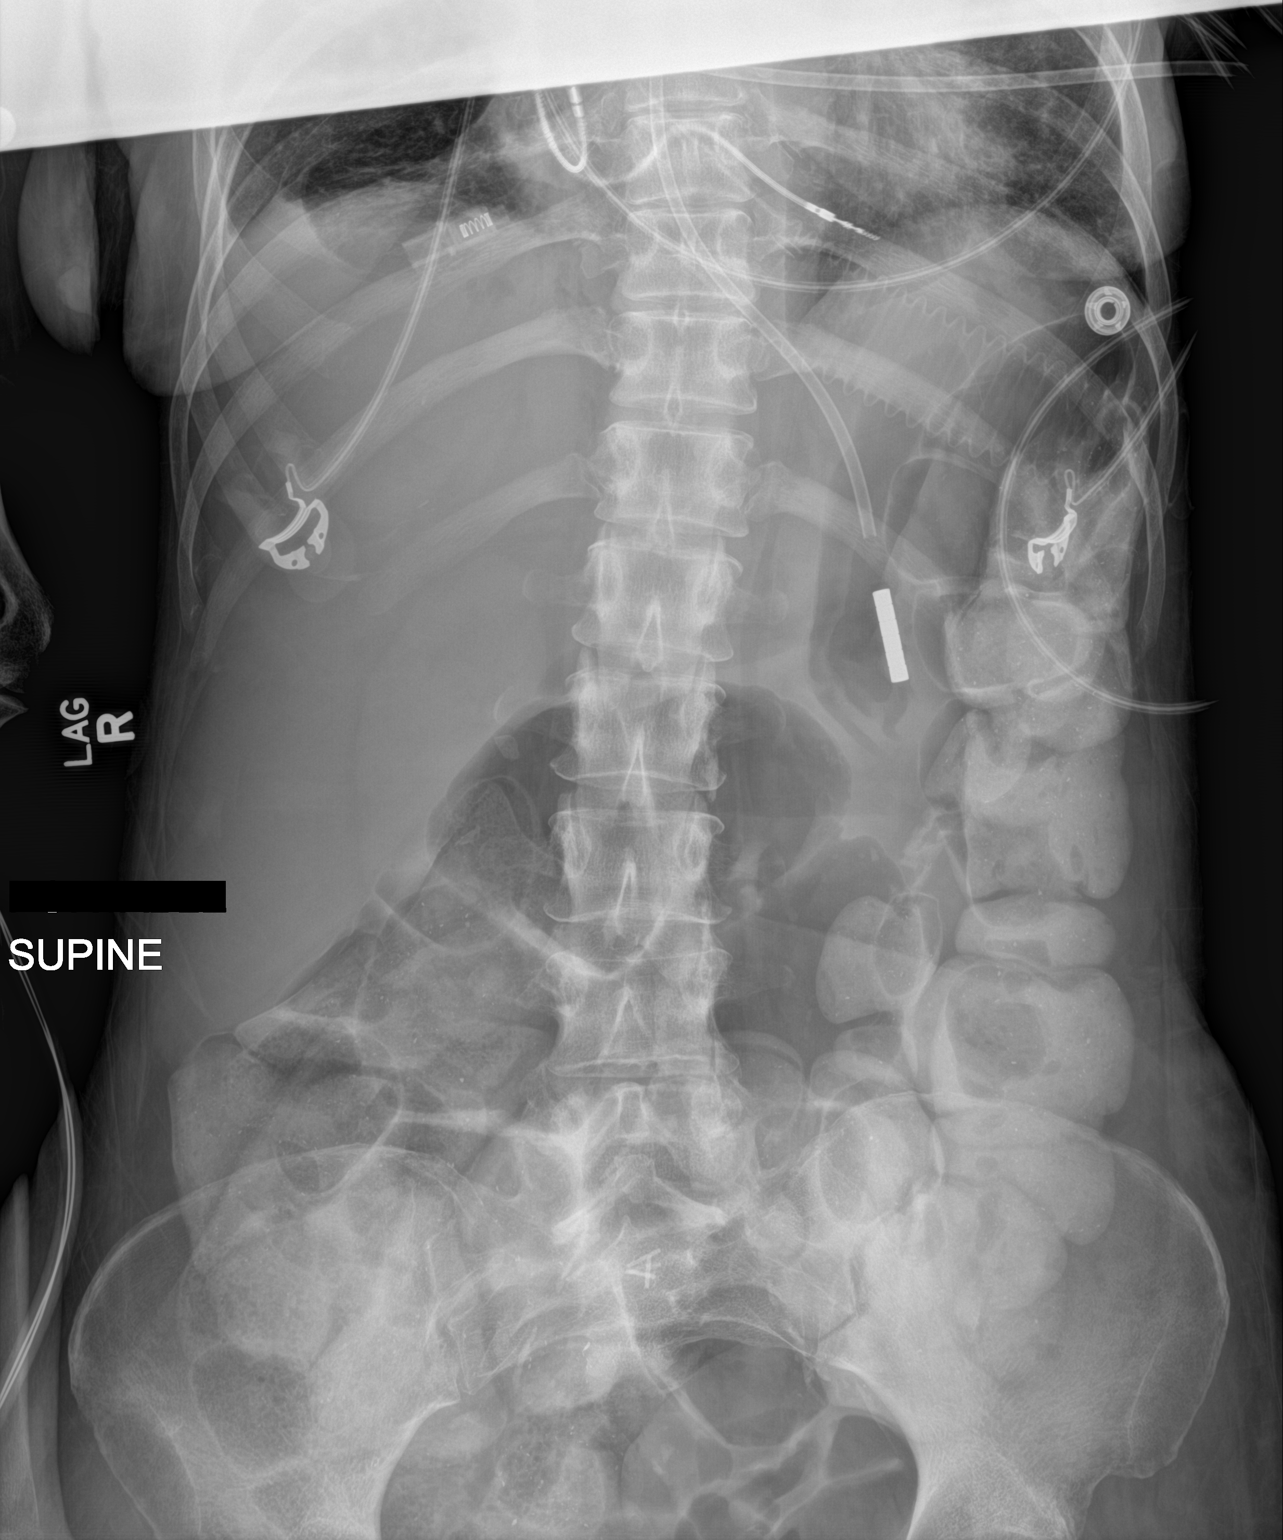

[1 of 1 positions shown; findings below may reference images not displayed]

FINDINGS: Dobhoff tube noted with tip projected over stomach. Soft tissue
structures are unremarkable. Oral contrast noted throughout the
colon. Again previously identified small bowel distention has
improved. No free air. Pelvic calcifications consistent phleboliths.
Surgical clips in the pelvis.
IMPRESSION: 1.Dobhoff tube in stable position with tip in the stomach. No
gastric distention.

2. Oral contrast in the colon. Again previously identified small
bowel distention has improved .

## 2017-12-27 IMAGING — DX DG CHEST 1V
1 series · 1 of 1 positions shown · non-contrast
Comparison: 11/14/2015

CLINICAL DATA: Increased tracheal secretions.

EXAM:
CHEST 1 VIEW

[chest ap]
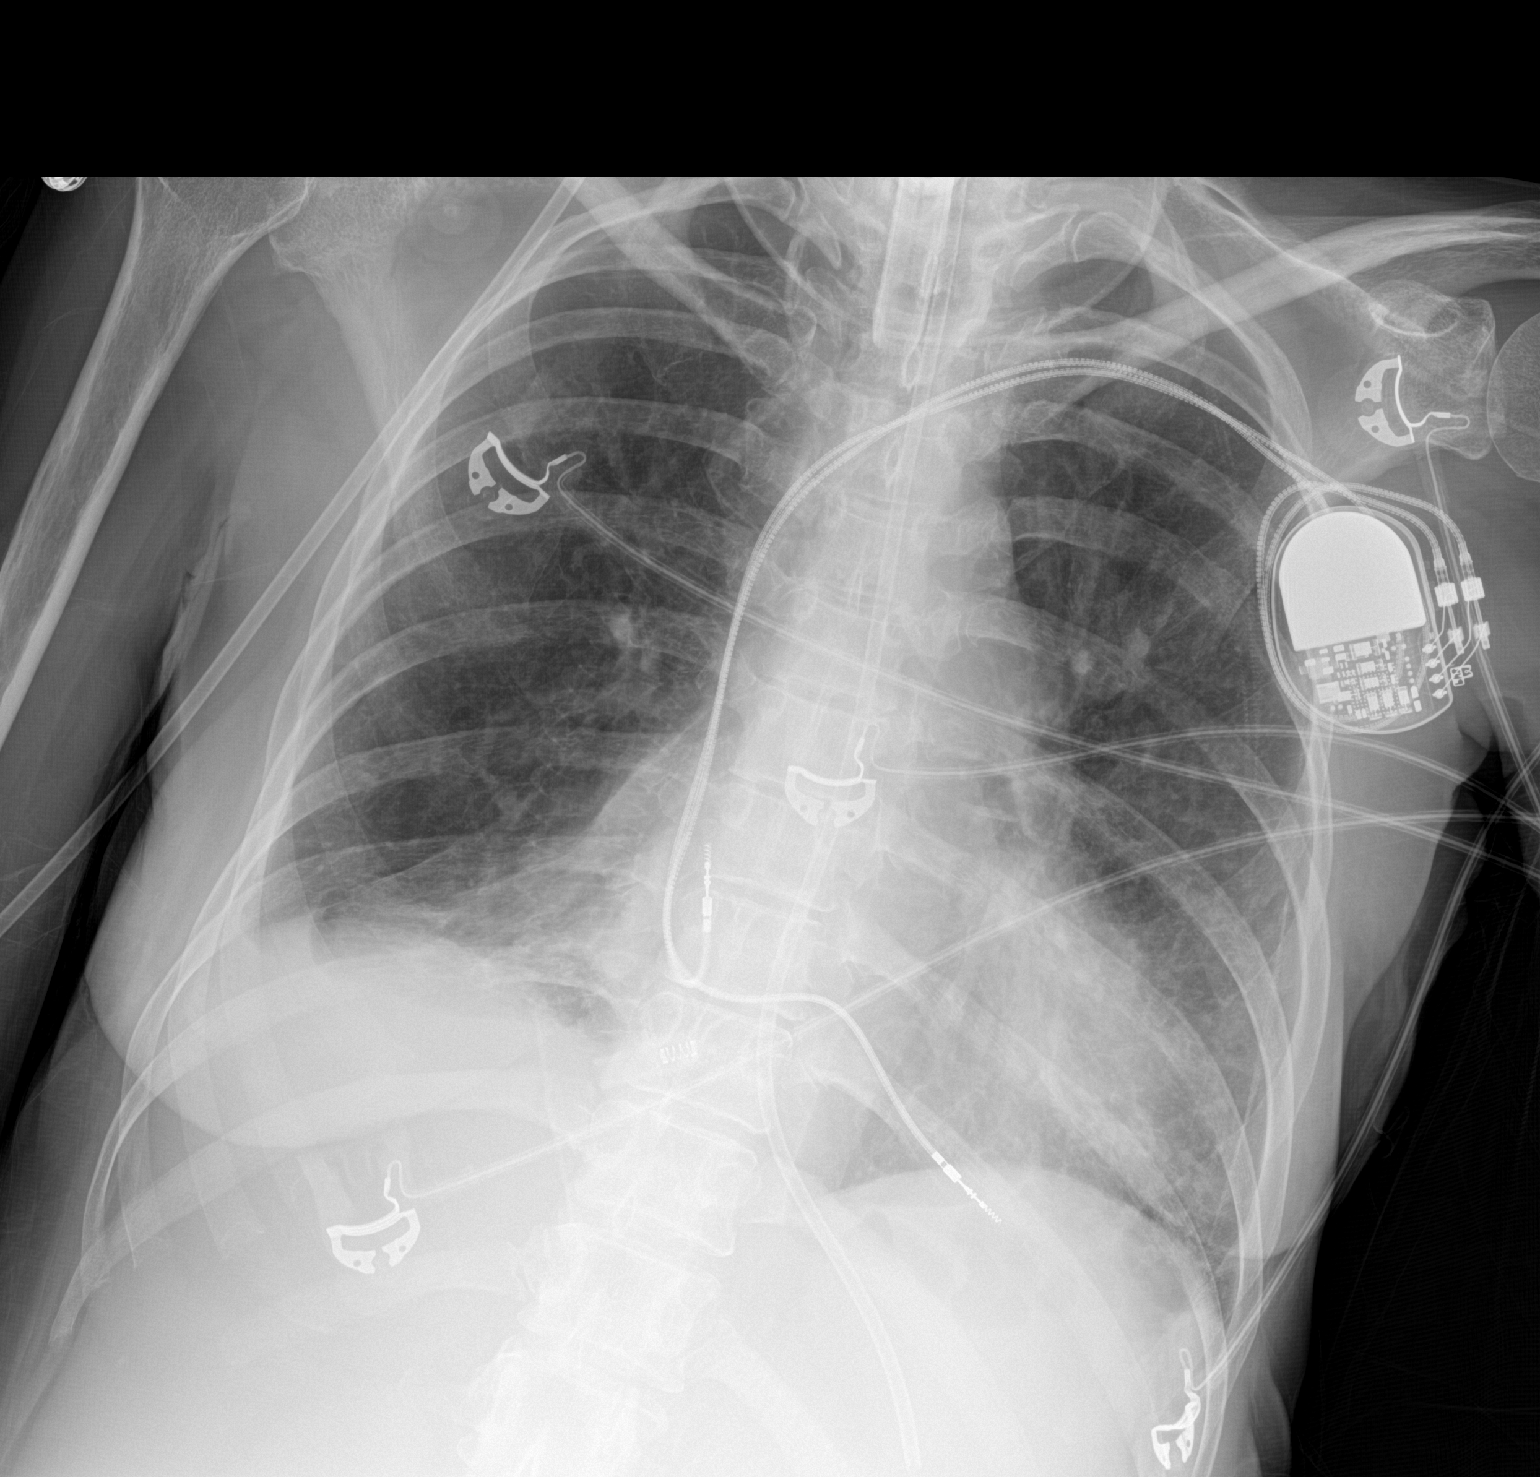

[1 of 1 positions shown; findings below may reference images not displayed]

FINDINGS: The tracheostomy tube tip is above the carina. The feeding tube tip
is below the field of view within the upper abdomen. There is a left
chest wall pacer device with leads in the right atrial appendage and
right ventricle. Normal heart size. There has been interval decrease
in volume of left effusion. Persistent right pleural effusion.
IMPRESSION: 1. Decrease in volume of left pleural effusion.
2. Persistent right effusion.

## 2017-12-27 MED ORDER — ALPRAZOLAM 0.25 MG PO TABS
0.2500 mg | ORAL_TABLET | Freq: Three times a day (TID) | ORAL | Status: DC | PRN
  Administered 2017-12-27 (×2): 0.25 mg
  Filled 2017-12-27 (×3): qty 1

## 2017-12-27 MED ORDER — FREE WATER
100.0000 mL | Freq: Three times a day (TID) | Status: DC
  Administered 2017-12-27 – 2017-12-28 (×2): 100 mL

## 2017-12-27 MED ORDER — INSULIN DETEMIR 100 UNIT/ML ~~LOC~~ SOLN
17.0000 [IU] | Freq: Two times a day (BID) | SUBCUTANEOUS | Status: DC
  Administered 2017-12-27 – 2017-12-28 (×3): 17 [IU] via SUBCUTANEOUS
  Filled 2017-12-27 (×4): qty 0.17

## 2017-12-27 MED ORDER — LACTULOSE 10 GM/15ML PO SOLN
10.0000 g | Freq: Two times a day (BID) | ORAL | Status: DC
  Administered 2017-12-27 – 2017-12-31 (×5): 10 g
  Filled 2017-12-27 (×9): qty 15

## 2017-12-27 MED ORDER — OXYBUTYNIN CHLORIDE 5 MG PO TABS
5.0000 mg | ORAL_TABLET | Freq: Three times a day (TID) | ORAL | Status: DC | PRN
  Filled 2017-12-27: qty 1

## 2017-12-27 NOTE — Progress Notes (Addendum)
[place] TE[place]  OFB:[id number] DOB: 1970-02-26 DOA: 12/16/2017 PCP: Briscoe Deutscher, DO    Brief Narrative:  47yo F admitted 12/16/2017 for unresponsiveness after several episodes of nausea and vomiting. She was found to have a glucose of 1637 and hypotension. Patient was resuscitated with IV fluids and insulin drip. 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  Significant Events: 11/27 admit 11/30 abx broadened due to Acinetobacter and Staph in cx 12/1 signif pulm atx  12/6 [facility] assumed care   Subjective: Appears comfortable at this time. No evidence of resp distress, or uncontrolled pain.   Assessment & Plan:  Acute on chronic hypoxemic respiratory failure status post tracheostomy stable on ventilator support at this time - reportedly does not require vent support at home - wean toward TC per PCCM   Acinetobacter and Staph multifocal pneumonia v/s colonization of the airway due to chronic trach Has completed 7 days of meropenem per PCCM - following WBC and fever curve   COPD well compensated   Chronic C5-C7 quadriplegia reportedly lives at home with assistance - indicates she wishes to return to her home   Acute metabolic encephalopathy mental status has stabilized with addition of low-dose scheduled narcotic  Severely uncontrolled DM2 w/ hyperglycemia, hyperosmolar nonketotic coma CBG reasonably controlled w/ room for improvement - adjust tx and follow   Urinary tract infection, complicated - chronic suprapubic catheter has completed a course of antibiotic therapy  Acute renal failure creatinine has now normalized  Normocytic anemia  Check anemia panel - suspect ACD  Chronic diastolic heart failure anasarca appears most consistent with a low protein state - is improving slowly with gentle diuresis -  cont same and watch renal fxn   Filed Weights   12/25/17 0500 12/26/17 0310 12/27/17 0428  Weight: 59.7 kg 57.9 kg 56.5 kg   Chronic pain - chronic opioid use Continue low-dose scheduled narcotic - tolerating well at this time   Protein caloric malnutrition Nutrition following  DVT prophylaxis: Subcutaneous heparin Code Status: FULL CODE Family Communication: no family present at time of exam  Disposition Plan: ICU on vent - possible discharge to home 24-48 hours  Consultants:  PCCM  Antimicrobials:  Levaquin 11/28 > 11/30 Meropenem 11/30 >12/6 Vancomycin 11/30 >12/1  Objective: Blood pressure 123/74, pulse (!) 106, temperature 99.2 F (37.3 C), temperature source Axillary, resp. rate (!) 29, height '4\' 9"'$  (1.448 m), weight 56.5 kg, last menstrual period 05/27/2016, SpO2 98 %.  Intake/Output Summary (Last 24 hours) at 12/27/2017 1435 Last data filed at 12/27/2017 1400 Gross per 24 hour  Intake 1110 ml  Output 1825 ml  Net -715 ml   Filed Weights   12/25/17 0500 12/26/17 0310 12/27/17 0428  Weight: 59.7 kg 57.9 kg 56.5 kg    Examination: General: No acute respiratory distress on trach / vent  Lungs: Clear to auscultation  Cardiovascular: RRR Abdomen: NT/ND, soft, bs+, no mass  Extremities: 1+ diffuse edema   CBC: Recent Labs  Lab 12/25/17 0718 12/26/17 0311 12/27/17 0314  WBC 15.9* 12.4* 11.8*  HGB 8.1* 7.8* 7.4*  HCT 25.0* 24.5* 23.6*  MCV 86.8 89.1 90.4  PLT 448* 509* 782*   Basic Metabolic Panel: Recent Labs  Lab 12/23/17 0255 12/24/17 0500 12/25/17 0718 12/26/17 0311 12/27/17 0314  NA 145 140 139 139 139  K 3.6 3.9 3.5 3.1* 4.5  CL 107 104 103 104 104  CO2 26 24 20* 21* 22  GLUCOSE 146* 73 155* 182* 190*  BUN 36* 37* 36* 33* 29*  CREATININE 1.35* 1.16* 1.14* 0.99 0.84  CALCIUM 8.5* 8.3* 8.4* 8.5* 8.7*  MG 1.8 1.7 2.2 2.0  --   PHOS 3.5 4.9* 4.9*  --   --    GFR: Estimated Creatinine Clearance: 59.9 mL/min (by C-G formula based on SCr of  0.84 mg/dL).  Liver Function Tests: Recent Labs  Lab 12/26/17 0311  AST 50*  ALT 43  ALKPHOS 327*  BILITOT 0.4  PROT 5.7*  ALBUMIN 1.8*    Recent Labs  Lab 12/21/17 1140 12/26/17 0311  AMMONIA 36* 29    HbA1C: Hgb A1c MFr Bld  Date/Time Value Ref Range Status  12/17/2017 03:53 AM 11.5 (H) 4.8 - 5.6 % Final    Comment:    (NOTE) Pre diabetes:          5.7%-6.4% Diabetes:              >6.4% Glycemic control for   <7.0% adults with diabetes   12/16/2017 03:07 AM 11.8 (H) 4.8 - 5.6 % Final    Comment:    (NOTE)         Prediabetes: 5.7 - 6.4         Diabetes: >6.4         Glycemic control for adults with diabetes: <7.0     CBG: Recent Labs  Lab 12/26/17 1939 12/27/17 0024 12/27/17 0419 12/27/17 0737 12/27/17 1131  GLUCAP 188* 161* 166* 144* 207*     Scheduled Meds: . arformoterol  15 mcg Nebulization BID  . baclofen  10 mg Per Tube TID  . budesonide (PULMICORT) nebulizer solution  0.5 mg Nebulization BID  . chlorhexidine gluconate (MEDLINE KIT)  15 mL Mouth Rinse BID  . famotidine  20 mg Per Tube QHS  . feeding supplement (PRO-STAT SUGAR FREE 64)  30 mL Oral Daily  . free water  200 mL Per Tube Q8H  . furosemide  20 mg Intravenous Q12H  . Gerhardt's butt cream   Topical QID  . heparin  5,000 Units Subcutaneous Q8H  . insulin aspart  0-20 Units Subcutaneous Q4H  . insulin detemir  15 Units Subcutaneous BID  . ipratropium  0.5 mg Nebulization Q6H  . lactulose  10 g Oral BID  . levalbuterol  1.25 mg Nebulization Q6H  . mouth rinse  15 mL Mouth Rinse 10 times per day  . midodrine  10 mg Per Tube TID WC  . oxyCODONE  5 mg Per Tube TID  . polyethylene glycol  17 g Oral Daily   Continuous Infusions: . sodium chloride 10 mL/hr at 12/26/17 1500  . feeding supplement (VITAL AF 1.2 CAL) 1,000 mL (12/26/17 1330)     LOS: 11 days   Cherene Altes, MD [facility] Office  [phone] Pager - Text Page per Amion  If 7PM-7AM, please contact  night-coverage per Amion 12/27/2017, 2:35 PM

## 2017-12-28 LAB — IRON AND TIBC
Iron: 48 ug/dL (ref 28–170)
Saturation Ratios: 15 % (ref 10.4–31.8)
TIBC: 328 ug/dL (ref 250–450)
UIBC: 280 ug/dL

## 2017-12-28 LAB — CBC
HCT: 23.5 % — ABNORMAL LOW (ref 36.0–46.0)
HEMOGLOBIN: 7.2 g/dL — AB (ref 12.0–15.0)
MCH: 28.1 pg (ref 26.0–34.0)
MCHC: 30.6 g/dL (ref 30.0–36.0)
MCV: 91.8 fL (ref 80.0–100.0)
Platelets: 571 10*3/uL — ABNORMAL HIGH (ref 150–400)
RBC: 2.56 MIL/uL — AB (ref 3.87–5.11)
RDW: 16.5 % — ABNORMAL HIGH (ref 11.5–15.5)
WBC: 13.7 10*3/uL — ABNORMAL HIGH (ref 4.0–10.5)
nRBC: 0 % (ref 0.0–0.2)

## 2017-12-28 LAB — BASIC METABOLIC PANEL
Anion gap: 14 (ref 5–15)
BUN: 27 mg/dL — ABNORMAL HIGH (ref 6–20)
CO2: 23 mmol/L (ref 22–32)
Calcium: 9 mg/dL (ref 8.9–10.3)
Chloride: 103 mmol/L (ref 98–111)
Creatinine, Ser: 0.71 mg/dL (ref 0.44–1.00)
GFR calc non Af Amer: >60 mL/min (ref >60)
Glucose, Bld: 129 mg/dL — ABNORMAL HIGH (ref 70–99)
POTASSIUM: 3.2 mmol/L — AB (ref 3.5–5.1)
Sodium: 140 mmol/L (ref 135–145)

## 2017-12-28 LAB — GLUCOSE, CAPILLARY
Glucose-Capillary: 117 mg/dL — ABNORMAL HIGH (ref 70–99)
Glucose-Capillary: 146 mg/dL — ABNORMAL HIGH (ref 70–99)
Glucose-Capillary: 159 mg/dL — ABNORMAL HIGH (ref 70–99)
Glucose-Capillary: 164 mg/dL — ABNORMAL HIGH (ref 70–99)
Glucose-Capillary: 170 mg/dL — ABNORMAL HIGH (ref 70–99)
Glucose-Capillary: 210 mg/dL — ABNORMAL HIGH (ref 70–99)
Glucose-Capillary: 223 mg/dL — ABNORMAL HIGH (ref 70–99)

## 2017-12-28 LAB — FOLATE: Folate: 19.3 ng/mL (ref >5.9)

## 2017-12-28 LAB — VITAMIN B12: Vitamin B-12: 662 pg/mL (ref 180–914)

## 2017-12-28 LAB — RETICULOCYTES
Immature Retic Fract: 20.3 % — ABNORMAL HIGH (ref 2.3–15.9)
RBC.: 2.56 MIL/uL — ABNORMAL LOW (ref 3.87–5.11)
Retic Count, Absolute: 114.4 10*3/uL (ref 19.0–186.0)
Retic Ct Pct: 4.5 % — ABNORMAL HIGH (ref 0.4–3.1)

## 2017-12-28 LAB — FERRITIN: Ferritin: 348 ng/mL — ABNORMAL HIGH (ref 11–307)

## 2017-12-28 MED ORDER — ALPRAZOLAM 0.25 MG PO TABS
0.2500 mg | ORAL_TABLET | Freq: Three times a day (TID) | ORAL | Status: DC | PRN
  Administered 2017-12-28: 0.25 mg
  Administered 2017-12-29: 0.5 mg
  Administered 2017-12-29: 0.25 mg
  Administered 2017-12-29 – 2017-12-31 (×4): 0.5 mg
  Filled 2017-12-28 (×5): qty 2
  Filled 2017-12-28 (×2): qty 1

## 2017-12-28 MED ORDER — POTASSIUM CHLORIDE 20 MEQ/15ML (10%) PO SOLN
20.0000 meq | Freq: Two times a day (BID) | ORAL | Status: DC
  Administered 2017-12-28 – 2017-12-30 (×5): 20 meq
  Filled 2017-12-28 (×5): qty 15

## 2017-12-28 MED ORDER — POTASSIUM CHLORIDE 20 MEQ/15ML (10%) PO SOLN
30.0000 meq | ORAL | Status: AC
  Administered 2017-12-28 (×2): 30 meq
  Filled 2017-12-28 (×2): qty 30

## 2017-12-28 NOTE — Progress Notes (Signed)
[facility] ADULT ICU REPLACEMENT PROTOCOL FOR AM LAB REPLACEMENT ONLY  The patient does apply for the [facility] Adult ICU Electrolyte Replacment Protocol based on the criteria listed below:   1. Is GFR >/= 40 ml/min? Yes.    Patient's GFR today is >60 2. Is urine output >/= 0.5 ml/kg/hr for the last 6 hours? Yes.   Patient's UOP is .9 ml/kg/hr 3. Is BUN < 60 mg/dL? Yes.    Patient's BUN today is 27 4. Abnormal electrolyte(s): k 3.2 5. Ordered repletion with: protocol 6. If a panic level lab has been reported, has the CCM MD in charge been notified? No..   Physician:    Ronda Fairly A 12/28/2017 6:17 AM

## 2017-12-28 NOTE — Progress Notes (Signed)
[place] TEAM [place]  EHO:[id number] DOB: Jun 19, 1970 DOA: 12/16/2017 PCP: Briscoe Deutscher, DO    Brief Narrative:  47yo F admitted 12/16/2017 for unresponsiveness after several episodes of nausea and vomiting. She was found to have a glucose of 1637 and hypotension. Patient was resuscitated with IV fluids and insulin drip. 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  Significant Events: 11/27 admit 11/30 abx broadened due to Acinetobacter and Staph in cx 12/1 signif pulm atx  12/6 [facility] assumed care   Subjective: The patient is complaining of a severe back spasm at the time of my exam.  She denies shortness of breath nausea vomiting abdominal pain or chest pain.  Case Management was able to contact the patient's mother who confirmed that the patient was not on vent support when living at home prior to this admission.  Assessment & Plan:  Acute on chronic hypoxemic respiratory failure status post tracheostomy stable on ventilator support at this time - does not require vent support at home - wean toward TC per PCCM as able - could consider LTACH if longer term wean is likely to be required   Acinetobacter and Staph aureus multifocal pneumonia v/s colonization of the airway due to chronic trach Has completed 7 days of meropenem per PCCM - afebrile, but WBC trending up - follow for clinical signs of infection   COPD well compensated   Chronic C5-C7 quadriplegia reportedly lives at home with assistance - indicates she wishes to return to her home - CM following   Acute metabolic encephalopathy mental status has stabilized with addition of low-dose scheduled narcotic - no change today   Severely uncontrolled DM2 w/ hyperglycemia, hyperosmolar nonketotic coma CBG controlled - no change in tx today   Urinary tract infection,  complicated - chronic suprapubic catheter has completed a course of antibiotic therapy - culture w/o helpful information   Acute renal failure creatinine has now normalized  Normocytic anemia  Fe studies unrevealing - Hgb slowly drifting down w/o gross evidence of blood loss - follow trend - if persists will consider non-contrasted CT abdom to r/o RPH  Chronic diastolic heart failure anasarca appears most consistent with a low protein state - is improving slowly with gentle diuresis - cont same - renal fxn stable presently   Filed Weights   12/26/17 0310 12/27/17 0428 12/28/17 0446  Weight: 57.9 kg 56.5 kg 56.4 kg   Chronic pain - chronic opioid use Continue low-dose scheduled narcotic - tolerating well at this time   Protein caloric malnutrition Nutrition following  DVT prophylaxis: Subcutaneous heparin Code Status: FULL CODE Family Communication: no family present at time of exam  Disposition Plan: ICU on vent - home v/s LTACH depending on timing of wean off vent   Consultants:  PCCM  Antimicrobials:  Levaquin 11/28 > 11/30 Meropenem 11/30 >12/6 Vancomycin 11/30 >12/1  Objective: Blood pressure 108/75, pulse 98, temperature 99.3 F (37.4 C), temperature source Oral, resp. rate (!) 24, height _0  (1.448 m), weight 56.4 kg, last menstrual period 05/27/2016, SpO2 98 %.  Intake/Output Summary (Last 24 hours) at 12/28/2017 0853 Last data filed at 12/28/2017 0800 Gross per 24 hour  Intake 1120 ml  Output 2250 ml  Net -1130 ml   Filed Weights   12/26/17 0310 12/27/17 0428 12/28/17 0446  Weight: 57.9 kg 56.5 kg 56.4 kg  Examination: General: No acute respiratory distress on trach / vent - anxious  Lungs: Clear to auscultation - no wheezing  Cardiovascular: RRR - no M  Abdomen: NT/ND, soft, bs+, no mass  Extremities: 1+ diffuse edema stable   CBC: Recent Labs  Lab 12/26/17 0311 12/27/17 0314 12/28/17 0402  WBC 12.4* 11.8* 13.7*  HGB 7.8* 7.4* 7.2*  HCT  24.5* 23.6* 23.5*  MCV 89.1 90.4 91.8  PLT 509* 536* 102*   Basic Metabolic Panel: Recent Labs  Lab 12/23/17 0255 12/24/17 0500 12/25/17 0718 12/26/17 0311 12/27/17 0314 12/28/17 0402  NA 145 140 139 139 139 140  K 3.6 3.9 3.5 3.1* 4.5 3.2*  CL 107 104 103 104 104 103  CO2 26 24 20* 21* 22 23  GLUCOSE 146* 73 155* 182* 190* 129*  BUN 36* 37* 36* 33* 29* 27*  CREATININE 1.35* 1.16* 1.14* 0.99 0.84 0.71  CALCIUM 8.5* 8.3* 8.4* 8.5* 8.7* 9.0  MG 1.8 1.7 2.2 2.0  --   --   PHOS 3.5 4.9* 4.9*  --   --   --    GFR: Estimated Creatinine Clearance: 62.7 mL/min (by C-G formula based on SCr of 0.71 mg/dL).  Liver Function Tests: Recent Labs  Lab 12/26/17 0311  AST 50*  ALT 43  ALKPHOS 327*  BILITOT 0.4  PROT 5.7*  ALBUMIN 1.8*    Recent Labs  Lab 12/21/17 1140 12/26/17 0311  AMMONIA 36* 29    HbA1C: Hgb A1c MFr Bld  Date/Time Value Ref Range Status  12/17/2017 03:53 AM 11.5 (H) 4.8 - 5.6 % Final    Comment:    (NOTE) Pre diabetes:          5.7%-6.4% Diabetes:              >6.4% Glycemic control for   <7.0% adults with diabetes   12/16/2017 03:07 AM 11.8 (H) 4.8 - 5.6 % Final    Comment:    (NOTE)         Prediabetes: 5.7 - 6.4         Diabetes: >6.4         Glycemic control for adults with diabetes: <7.0     CBG: Recent Labs  Lab 12/27/17 1537 12/27/17 1932 12/27/17 2359 12/28/17 0401 12/28/17 0721  GLUCAP 177* 111* 170* 117* 164*     Scheduled Meds: . arformoterol  15 mcg Nebulization BID  . baclofen  10 mg Per Tube TID  . budesonide (PULMICORT) nebulizer solution  0.5 mg Nebulization BID  . chlorhexidine gluconate (MEDLINE KIT)  15 mL Mouth Rinse BID  . famotidine  20 mg Per Tube QHS  . feeding supplement (PRO-STAT SUGAR FREE 64)  30 mL Oral Daily  . free water  100 mL Per Tube Q8H  . furosemide  20 mg Intravenous Q12H  . Gerhardt's butt cream   Topical QID  . heparin  5,000 Units Subcutaneous Q8H  . insulin aspart  0-20 Units  Subcutaneous Q4H  . insulin detemir  17 Units Subcutaneous BID  . ipratropium  0.5 mg Nebulization Q6H  . lactulose  10 g Per Tube BID  . levalbuterol  1.25 mg Nebulization Q6H  . mouth rinse  15 mL Mouth Rinse 10 times per day  . midodrine  10 mg Per Tube TID WC  . oxyCODONE  5 mg Per Tube TID  . polyethylene glycol  17 g Oral Daily  . potassium chloride  30 mEq Per Tube Q4H  Continuous Infusions: . sodium chloride 10 mL/hr at 12/26/17 1500  . feeding supplement (VITAL AF 1.2 CAL) 45 mL/hr at 12/28/17 0800     LOS: 12 days   Cherene Altes, MD [facility] Office  [phone] Pager - Text Page per Amion  If 7PM-7AM, please contact night-coverage per Amion 12/28/2017, 8:53 AM

## 2017-12-28 NOTE — Care Management Note (Signed)
Case Management Note  Patient Details  Name: Angela Page MRN: [id number] Date of Birth: [birth year]  Subjective/Objective:   Pt admitted post being found unresponsive at home                Action/Plan:  PTA quad from home chronic trach/peg.  Pt has 24 hour care in the home   Expected Discharge Date:                  Expected Discharge Plan:  (From home with caregivers)  In-House Referral:  Clinical Social Work  Discharge planning Services  CM Consult  Post Acute Care Choice:    Choice offered to:     DME Arranged:    DME Agency:     HH Arranged:    [place] Agency:     Status of Service:     If discussed at [employer], dates discussed:    Additional Comments: 12/28/2017  Pt remains on vent via trach at this time.  CM confirmed with pts mom that pt was never on the vent at home - pts mom informed CM that she is not able to care for pt if she needs the vent at discharge.  Pt stays with mom and per mom she has all needed equipment , oxygen and trach supplies provided by [facility].  Pt will need PTAR transportation home.    12/23/17 Pt remains on ventilator (not on vent PTA).  Pt remains on IV antibiotics and hypertonic solutions with worsening right lower lobe collapse.  CM discussed pt with PCCM team - pt is too unstable at this time to even consider vent at home.  Pt remains a FULL Code.  Pt is active with [facility] 16 hours for [facility]. CM will continue to follow  Maryclare Labrador, RN 12/28/2017, 9:30 AM

## 2017-12-29 ENCOUNTER — Encounter (HOSPITAL_BASED_OUTPATIENT_CLINIC_OR_DEPARTMENT_OTHER): Payer: Medicare Other | Attending: Internal Medicine

## 2017-12-29 LAB — CBC
HCT: 23.8 % — ABNORMAL LOW (ref 36.0–46.0)
Hemoglobin: 7.3 g/dL — ABNORMAL LOW (ref 12.0–15.0)
MCH: 28.6 pg (ref 26.0–34.0)
MCHC: 30.7 g/dL (ref 30.0–36.0)
MCV: 93.3 fL (ref 80.0–100.0)
Platelets: 530 10*3/uL — ABNORMAL HIGH (ref 150–400)
RBC: 2.55 MIL/uL — AB (ref 3.87–5.11)
RDW: 17.5 % — ABNORMAL HIGH (ref 11.5–15.5)
WBC: 12.9 10*3/uL — ABNORMAL HIGH (ref 4.0–10.5)
nRBC: 0.2 % (ref 0.0–0.2)

## 2017-12-29 LAB — BASIC METABOLIC PANEL
Anion gap: 12 (ref 5–15)
BUN: 25 mg/dL — ABNORMAL HIGH (ref 6–20)
CO2: 22 mmol/L (ref 22–32)
Calcium: 9.2 mg/dL (ref 8.9–10.3)
Chloride: 107 mmol/L (ref 98–111)
Creatinine, Ser: 0.71 mg/dL (ref 0.44–1.00)
GFR calc Af Amer: >60 mL/min (ref >60)
GFR calc non Af Amer: >60 mL/min (ref >60)
Glucose, Bld: 179 mg/dL — ABNORMAL HIGH (ref 70–99)
Potassium: 4 mmol/L (ref 3.5–5.1)
Sodium: 141 mmol/L (ref 135–145)

## 2017-12-29 LAB — GLUCOSE, CAPILLARY
Glucose-Capillary: 171 mg/dL — ABNORMAL HIGH (ref 70–99)
Glucose-Capillary: 256 mg/dL — ABNORMAL HIGH (ref 70–99)
Glucose-Capillary: 160 mg/dL — ABNORMAL HIGH (ref 70–99)
Glucose-Capillary: 160 mg/dL — ABNORMAL HIGH (ref 70–99)
Glucose-Capillary: 134 mg/dL — ABNORMAL HIGH (ref 70–99)

## 2017-12-29 MED ORDER — LEVALBUTEROL HCL 1.25 MG/0.5ML IN NEBU
1.2500 mg | INHALATION_SOLUTION | Freq: Three times a day (TID) | RESPIRATORY_TRACT | Status: DC
  Administered 2017-12-29 – 2017-12-31 (×6): 1.25 mg via RESPIRATORY_TRACT
  Filled 2017-12-29 (×6): qty 0.5

## 2017-12-29 MED ORDER — INSULIN DETEMIR 100 UNIT/ML ~~LOC~~ SOLN
20.0000 [IU] | Freq: Two times a day (BID) | SUBCUTANEOUS | Status: DC
  Administered 2017-12-29 – 2017-12-30 (×3): 20 [IU] via SUBCUTANEOUS
  Filled 2017-12-29 (×5): qty 0.2

## 2017-12-29 MED ORDER — FENTANYL CITRATE (PF) 100 MCG/2ML IJ SOLN
25.0000 ug | INTRAMUSCULAR | Status: DC | PRN
  Administered 2017-12-29 – 2017-12-31 (×12): 25 ug via INTRAVENOUS
  Filled 2017-12-29 (×12): qty 2

## 2017-12-29 NOTE — Care Management Note (Addendum)
Case Management Note  Patient Details  Name: Angela Page MRN: [id number] Date of Birth: 1970-06-13  Subjective/Objective:   Pt admitted post being found unresponsive at home                Action/Plan:  PTA quad from home chronic trach/peg.  Pt has 24 hour care in the home   Expected Discharge Date:                  Expected Discharge Plan:  (From home with caregivers)  In-House Referral:  Clinical Social Work  Discharge planning Services  CM Consult  Post Acute Care Choice:    Choice offered to:     DME Arranged:    DME Agency:     HH Arranged:    [place] Agency:     Status of Service:     If discussed at [employer], dates discussed:    Additional Comments: 12/30/2017 Pt and mom chose [facility] notified.  [facility] can offer bed today.  CM discussed discharge plan with attending - pt not yet stable for discharge today.  CM will continue to follow   12/29/2017  Pts mom spoke with both facilities - planning on touring facilities possibly tomorrow  [facility] referral received - LOS members in agreement.  CM retrieved medicare.gov approved LTACH facilities based on pts zip code.   CM provided both facilities referral - both [facility] and [facility] can offer bed to pt today.  CM discussed LTACH discharge plan with pts mom and pt as pts orientation is in question - CM provided choice and pts mom would like to discuss options with each facility.  12/28/17 Pt remains on vent via trach at this time.  CM confirmed with pts mom that pt was never on the vent at home - pts mom informed CM that she is not able to care for pt if she needs the vent at discharge.  Pt stays with mom and per mom she has all needed equipment , oxygen and trach supplies provided by [facility].  Pt will need PTAR transportation home.    12/23/17 Pt remains on ventilator (not on vent PTA).  Pt remains on IV antibiotics and hypertonic solutions with worsening right lower lobe collapse.  CM discussed pt  with PCCM team - pt is too unstable at this time to even consider vent at home.  Pt remains a FULL Code.  Pt is active with [facility] 16 hours for [facility]. CM will continue to follow  Maryclare Labrador, RN 12/29/2017, 9:49 AM

## 2017-12-29 NOTE — Progress Notes (Signed)
[place] TEAM [place]  VQQ:[id number] DOB: 1970/12/23 DOA: 12/16/2017 PCP: Briscoe Deutscher, DO    Brief Narrative:  47yo F admitted 12/16/2017 for unresponsiveness after several episodes of nausea and vomiting. She was found to have a glucose of 1637 and hypotension. Patient was resuscitated with IV fluids and insulin drip. 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  Significant Events: 11/27 admit 11/30 abx broadened due to Acinetobacter and Staph in cx 12/1 signif pulm atx  12/6 [facility] assumed care   Subjective: The patient is alert and interactive.  She appears to be oriented.  She tells me she is motivated to get off the ventilator and be discharged home.  She denies chest pain.  She reports that her back spasms are much better controlled at this time.  She denies chest pain or shortness of breath.  Assessment & Plan:  Acute on chronic hypoxemic respiratory failure status post tracheostomy stable on ventilator support at this time - does not require vent support at home - wean toward TC per PCCM as able - consider LTACH if longer term wean will be required   Acinetobacter and Staph aureus multifocal pneumonia v/s colonization of the airway due to chronic trach Has completed 7 days of meropenem per PCCM - afebrile, but WBC trending up - follow for clinical signs of infection - isolated fever yesterday afternoon of unclear significance with no other clinical signs to suggest active infection -monitor  COPD well compensated   Chronic C5-C7 quadriplegia reportedly lives at home with assistance - indicates she wishes to return to her home - CM following   Acute metabolic encephalopathy mental status has stabilized with addition of low-dose scheduled narcotic   Severely uncontrolled DM2 w/ hyperglycemia, hyperosmolar  nonketotic coma CBG trending up -adjust treatment and follow  Urinary tract infection, complicated - chronic suprapubic catheter has completed a course of antibiotic therapy - culture w/o helpful information   Acute renal failure creatinine has now normalized  Normocytic anemia  Fe studies unrevealing - Hgb appears to have stabilized  Chronic diastolic heart failure anasarca appears most consistent with a low protein state - is improving slowly with gentle diuresis - cont same - renal fxn stable presently   Filed Weights   12/27/17 0428 12/28/17 0446 12/29/17 0438  Weight: 56.5 kg 56.4 kg 54.1 kg   Chronic pain - chronic opioid use Continue low-dose scheduled narcotic - tolerating well at this time   Protein caloric malnutrition Nutrition following  DVT prophylaxis: Subcutaneous heparin Code Status: FULL CODE Family Communication: no family present at time of exam  Disposition Plan: ICU on vent - home v/s LTACH depending on timing of wean off vent   Consultants:  PCCM  Antimicrobials:  Levaquin 11/28 > 11/30 Meropenem 11/30 >12/6 Vancomycin 11/30 >12/1  Objective: Blood pressure 108/72, pulse (!) 102, temperature 98.3 F (36.8 C), temperature source Oral, resp. rate (!) 24, height _0  (1.448 m), weight 54.1 kg, last menstrual period 05/27/2016, SpO2 100 %.  Intake/Output Summary (Last 24 hours) at 12/29/2017 0933 Last data filed at 12/29/2017 0800 Gross per 24 hour  Intake 945 ml  Output 1795 ml  Net -850 ml   Filed Weights   12/27/17 0428 12/28/17 0446 12/29/17 0438  Weight: 56.5 kg 56.4 kg 54.1 kg    Examination: General: No acute respiratory distress - stable on  vent via trach  Lungs: mild bibasilar crackles - no wheeze  Cardiovascular: RRR w/o M  Abdomen: NT/ND, soft, bs+, no mass  Extremities: 1+ diffuse edema w/o signif change   CBC: Recent Labs  Lab 12/27/17 0314 12/28/17 0402 12/29/17 0300  WBC 11.8* 13.7* 12.9*  HGB 7.4* 7.2* 7.3*  HCT  23.6* 23.5* 23.8*  MCV 90.4 91.8 93.3  PLT 536* 571* 267*   Basic Metabolic Panel: Recent Labs  Lab 12/23/17 0255 12/24/17 0500 12/25/17 0718 12/26/17 0311 12/27/17 0314 12/28/17 0402 12/29/17 0300  NA 145 140 139 139 139 140 141  K 3.6 3.9 3.5 3.1* 4.5 3.2* 4.0  CL 107 104 103 104 104 103 107  CO2 26 24 20* 21* _0 GLUCOSE 146* 73 155* 182* 190* 129* 179*  BUN 36* 37* 36* 33* 29* 27* 25*  CREATININE 1.35* 1.16* 1.14* 0.99 0.84 0.71 0.71  CALCIUM 8.5* 8.3* 8.4* 8.5* 8.7* 9.0 9.2  MG 1.8 1.7 2.2 2.0  --   --   --   PHOS 3.5 4.9* 4.9*  --   --   --   --    GFR: Estimated Creatinine Clearance: 61.5 mL/min (by C-G formula based on SCr of 0.71 mg/dL).  Liver Function Tests: Recent Labs  Lab 12/26/17 0311  AST 50*  ALT 43  ALKPHOS 327*  BILITOT 0.4  PROT 5.7*  ALBUMIN 1.8*    Recent Labs  Lab 12/26/17 0311  AMMONIA 29    HbA1C: Hgb A1c MFr Bld  Date/Time Value Ref Range Status  12/17/2017 03:53 AM 11.5 (H) 4.8 - 5.6 % Final    Comment:    (NOTE) Pre diabetes:          5.7%-6.4% Diabetes:              >6.4% Glycemic control for   <7.0% adults with diabetes   12/16/2017 03:07 AM 11.8 (H) 4.8 - 5.6 % Final    Comment:    (NOTE)         Prediabetes: 5.7 - 6.4         Diabetes: >6.4         Glycemic control for adults with diabetes: <7.0     CBG: Recent Labs  Lab 12/28/17 1552 12/28/17 1938 12/28/17 2330 12/29/17 0415 12/29/17 0748  GLUCAP 146* 159* 223* 171* 256*     Scheduled Meds: . arformoterol  15 mcg Nebulization BID  . baclofen  10 mg Per Tube TID  . budesonide (PULMICORT) nebulizer solution  0.5 mg Nebulization BID  . chlorhexidine gluconate (MEDLINE KIT)  15 mL Mouth Rinse BID  . famotidine  20 mg Per Tube QHS  . feeding supplement (PRO-STAT SUGAR FREE 64)  30 mL Oral Daily  . furosemide  20 mg Intravenous Q12H  . Gerhardt's butt cream   Topical QID  . heparin  5,000 Units Subcutaneous Q8H  . insulin aspart  0-20 Units  Subcutaneous Q4H  . insulin detemir  17 Units Subcutaneous BID  . ipratropium  0.5 mg Nebulization Q6H  . lactulose  10 g Per Tube BID  . levalbuterol  1.25 mg Nebulization Q6H  . mouth rinse  15 mL Mouth Rinse 10 times per day  . midodrine  10 mg Per Tube TID WC  . oxyCODONE  5 mg Per Tube TID  . polyethylene glycol  17 g Oral Daily  . potassium chloride  20 mEq Per Tube BID   Continuous Infusions: . sodium chloride  10 mL/hr at 12/26/17 1500  . feeding supplement (VITAL AF 1.2 CAL) 45 mL/hr at 12/29/17 0600     LOS: 13 days   Cherene Altes, MD [facility] Office  [phone] Pager - Text Page per Amion  If 7PM-7AM, please contact night-coverage per Amion 12/29/2017, 9:33 AM

## 2017-12-30 ENCOUNTER — Inpatient Hospital Stay (HOSPITAL_COMMUNITY): Payer: Medicare Other

## 2017-12-30 DIAGNOSIS — D72825 Bandemia: Secondary | ICD-10-CM

## 2017-12-30 LAB — GLUCOSE, CAPILLARY
Glucose-Capillary: 127 mg/dL — ABNORMAL HIGH (ref 70–99)
Glucose-Capillary: 131 mg/dL — ABNORMAL HIGH (ref 70–99)
Glucose-Capillary: 176 mg/dL — ABNORMAL HIGH (ref 70–99)
Glucose-Capillary: 178 mg/dL — ABNORMAL HIGH (ref 70–99)
Glucose-Capillary: 181 mg/dL — ABNORMAL HIGH (ref 70–99)
Glucose-Capillary: 204 mg/dL — ABNORMAL HIGH (ref 70–99)
Glucose-Capillary: 246 mg/dL — ABNORMAL HIGH (ref 70–99)

## 2017-12-30 MED ORDER — FUROSEMIDE 40 MG PO TABS
40.0000 mg | ORAL_TABLET | Freq: Every day | ORAL | Status: DC
  Administered 2017-12-30 – 2017-12-31 (×2): 40 mg
  Filled 2017-12-30 (×2): qty 1

## 2017-12-30 NOTE — Progress Notes (Signed)
Nutrition Follow-up  DOCUMENTATION CODES:   Not applicable  INTERVENTION:    Continue Vital AF 1.2 at 45 ml/h (1080 ml per day) with Pro-stat 30 ml once daily to provide 1396 kcal, 96 gm protein, 876 ml free water daily  NUTRITION DIAGNOSIS:   Inadequate oral intake related to inability to eat as evidenced by NPO status.  Ongoing  GOAL:   Patient will meet greater than or equal to 90% of their needs  Met with TF  MONITOR:   Vent status, TF tolerance, Weight trends, Labs  ASSESSMENT:   47 year-old with hx of quadriplegia, trach, PEG, and suprapubic cath, tobacco use, asthma, CHF, chronic pain with associated opioid induced constipation, depression, GERD, calorie malnutrition, and substance abuse. She was found unresponsive at home after several episodes of nausea/ vomiting found to have glucose 1637 and hypotension, started on IVF and insulin gtt.  Patient remains on Vital AF 1.2 via PEG; tolerating well to meet 100% of estimated needs.   Patient is currently intubated on ventilator support MV: 12.8 L/min Temp (24hrs), Avg:99.1 F (37.3 C), Min:98.2 F (36.8 C), Max:101 F (38.3 C)   Plans for d/c to [facility] soon. Labs and medications reviewed.   Diet Order:   Diet Order            Diet NPO time specified  Diet effective now              EDUCATION NEEDS:   No education needs have been identified at this time  Skin:  Skin Assessment: Skin Integrity Issues: Skin Integrity Issues:: Stage II, Stage IV Stage II: sacrum, anus, R ischium Stage IV: R ischium  Last BM:  12/4 (type 7)  Height:   Ht Readings from Last 1 Encounters:  12/17/17 '4\' 9"'$  (1.448 m)    Weight:   Wt Readings from Last 1 Encounters:  12/30/17 54.2 kg    Ideal Body Weight:  41.45 kg  BMI:  Body mass index is 25.86 kg/m.  Estimated Nutritional Needs:   Kcal:  1400  Protein:  95-110 gm  Fluid:  >/= 1.5 L    Molli Barrows, RD, LDN, [place] Pager [phone] After Hours  Pager [phone]

## 2017-12-30 NOTE — Progress Notes (Addendum)
PROGRESS NOTE    Angela Page  WNU:[id number] DOB: 04-14-1970 DOA: 12/16/2017 PCP: Briscoe Deutscher, DO    Brief Narrative:  47 year old female who presented with altered mentation, unresponsiveness, hyperglycemia, dyspnea, nausea and vomiting.  Does have significant past medical history of quadriplegia due to cervical spine injury, status post tracheostomy, she also has a suprapubic catheter and a PEG tube.  Positive for dyslipidemia, COPD, asthma, GERD, depression, polysubstance dependency, and diastolic heart failure.  On her initial physical examination blood pressure 98/68, heart rate 85, respirate 15, oxygen saturation 90%, she had diffuse rhonchi, S1-S2 present and rhythmic, abdomen nontender, no lower extremity edema, patient was comatose.  Patient was admitted to the hospital with a working diagnosis of hyperglycemic hyperosmolar nonketotic state.   Patient developed Acinetobacter and Staphylococcus aureus multifocal pneumonia, and required to be placed on invasive mechanical ventilation.  Assessment & Plan:   Principal Problem:   Hyperglycemic hyperosmolar nonketotic coma ([place]) Active Problems:   Depression   Quadriplegia, C5-C7 incomplete (HCC)   Leukocytosis   Hypokalemia   Hypotension   Tobacco abuse   Asthma   Tracheostomy status (HCC)   Chronic diastolic CHF (congestive heart failure) (HCC)   Chronic obstructive pulmonary disease (HCC)   Presence of permanent cardiac pacemaker   PEG (percutaneous endoscopic gastrostomy) status (HCC)   Lactic acid acidosis   AKI (acute kidney injury) ([place])   Suprapubic catheter ([place])   HHNC (hyperglycemic hyperosmolar nonketotic coma) ([place])  1. Acute on chronic hypoxic respiratory failure sp tracheostomy. Patient continue on mandatory pressure control at 25 cm with PEEP of 5, no significant dyssynchrony, will continue reduction in pressure per protocol. Fi02 40%.   2. Acinetobacter and staph aureus pneumonia. Patient with no  fevers for the last 24 hours or increased oxygen requirements, lung examination with no significant rhonchi. Completed antibiotic therapy with meropenem. Wbc trending down to 12,9 from 13,7.   3. COPD. Continue bronchodilator therapy, no signs of acute exacerbation. Continue budesonide and arformoterol.   4. Metabolic encephalopathy. Patient is awake and alert, following commands and answering to simple questions.   5. T2DM with uncontrolled hyperglycemia and hyperosmolar state. Continue insulin therapy, tolerating well tube feedings. Continue glucose control with insulin levimir 20 units bid and insulin sliding scale.   6. Chronic diastolic heart failure. Stable with no signs of exacerbation. Will change furosemide to per peg. Check renal panel in am.   7. Quadriplegia complicated with urinary retention and suprapubic catheter. Catheter related infection, patient completed antibiotic therapy. Continue midodrine. Continue pain control with ibuprofen and fentanyl patch. Continue baclofen and oxycodone.  8. Normocytic anemia. Chronic and stable.   9. Protein calorie malnutrition (unspecified). Continue nutritional supplements and tube feedings.   10. Pressure ulcers (present on admission) right ischial tuberosity stage 4, other sacrum stage 2 and other unstageable.   DVT prophylaxis:enoxaparin   Code Status: full Family Communication: no family at the bedside  Disposition Plan/ discharge barriers: LTAC  Body mass index is 25.86 kg/m. Malnutrition Type:  Nutrition Problem: Inadequate oral intake Etiology: inability to eat   Malnutrition Characteristics:  Signs/Symptoms: NPO status   Nutrition Interventions:  Interventions: Tube feeding  RN Pressure Injury Documentation: Pressure Injury 06/09/16 Stage IV - Full thickness tissue loss with exposed bone, tendon or muscle. almost completely healed; pinhole sized open wound in center remains when assessed on 6/5 (Active)  06/09/16  1906   Location: Ischial tuberosity  Location Orientation: Right  Staging: Stage IV - Full thickness tissue loss with  exposed bone, tendon or muscle.  Wound Description (Comments): almost completely healed; pinhole sized open wound in center remains when assessed on 6/5  Present on Admission: Yes     Pressure Injury Unstageable - Full thickness tissue loss in which the base of the ulcer is covered by slough (yellow, tan, gray, green or brown) and/or eschar (tan, brown or black) in the wound bed. healed old wound (Active)      Location: Sacrum  Location Orientation: Mid  Staging: Unstageable - Full thickness tissue loss in which the base of the ulcer is covered by slough (yellow, tan, gray, green or brown) and/or eschar (tan, brown or black) in the wound bed.  Wound Description (Comments): healed old wound  Present on Admission: Yes     Pressure Injury Unstageable - Full thickness tissue loss in which the base of the ulcer is covered by slough (yellow, tan, gray, green or brown) and/or eschar (tan, brown or black) in the wound bed. old wound (Active)      Location: Thigh  Location Orientation: Posterior;Proximal;Right  Staging: Unstageable - Full thickness tissue loss in which the base of the ulcer is covered by slough (yellow, tan, gray, green or brown) and/or eschar (tan, brown or black) in the wound bed.  Wound Description (Comments): old wound  Present on Admission: Yes     Pressure Injury 12/20/17 Stage II -  Partial thickness loss of dermis presenting as a shallow open ulcer with a red, pink wound bed without slough. (Active)  12/20/17 0800   Location: Sacrum  Location Orientation: Right;Left  Staging: Stage II -  Partial thickness loss of dermis presenting as a shallow open ulcer with a red, pink wound bed without slough.  Wound Description (Comments):   Present on Admission:      Pressure Injury 12/20/17 Stage II -  Partial thickness loss of dermis presenting as a shallow open  ulcer with a red, pink wound bed without slough. (Active)  12/20/17 0800   Location: Anus  Location Orientation: Right;Left  Staging: Stage II -  Partial thickness loss of dermis presenting as a shallow open ulcer with a red, pink wound bed without slough.  Wound Description (Comments):   Present on Admission:      Pressure Injury 12/20/17 Stage II -  Partial thickness loss of dermis presenting as a shallow open ulcer with a red, pink wound bed without slough. (Active)  12/20/17 0800   Location: Ischial tuberosity  Location Orientation: Right  Staging: Stage II -  Partial thickness loss of dermis presenting as a shallow open ulcer with a red, pink wound bed without slough.  Wound Description (Comments):   Present on Admission:      Consultants:     Procedures:     Antimicrobials:       Subjective: Patient continue to be on mandatory mechanical ventilation pressure control, complains of pain at the site of the trach, tolerating well tube feedings, no nausea or vomiting.   Objective: Vitals:   12/30/17 0630 12/30/17 0700 12/30/17 0730 12/30/17 0818  BP: 91/66 102/69 97/67   Pulse: 97 100 96   Resp: (!) 24 (!) 24 (!) 24   Temp:   98.2 F (36.8 C)   TempSrc:   Oral   SpO2: 100% 100% 100% 100%  Weight:      Height:        Intake/Output Summary (Last 24 hours) at 12/30/2017 1003 Last data filed at 12/30/2017 0700 Gross per 24 hour  Intake 952.99 ml  Output 1580 ml  Net -627.01 ml   Filed Weights   12/28/17 0446 12/29/17 0438 12/30/17 0445  Weight: 56.4 kg 54.1 kg 54.2 kg    Examination:   General: deconditioned and ill looking appearing Neurology: Awake and alert, non focal  E ENT: positive pallor, no icterus, oral mucosa moist/ trach in place.  Cardiovascular: No JVD. S1-S2 present, rhythmic, no gallops, rubs, or murmurs. No lower extremity edema. Pulmonary: decreased breath sounds bilaterally, poor air movement, no wheezing, rhonchi or  rales. Gastrointestinal. Abdomen mld distended, peg tube in place, with no organomegaly, non tender, no rebound or guarding Skin. No rashes Musculoskeletal: no joint deformities     Data Reviewed: I have personally reviewed following labs and imaging studies  CBC: Recent Labs  Lab 12/25/17 0718 12/26/17 0311 12/27/17 0314 12/28/17 0402 12/29/17 0300  WBC 15.9* 12.4* 11.8* 13.7* 12.9*  HGB 8.1* 7.8* 7.4* 7.2* 7.3*  HCT 25.0* 24.5* 23.6* 23.5* 23.8*  MCV 86.8 89.1 90.4 91.8 93.3  PLT 448* 509* 536* 571* 219*   Basic Metabolic Panel: Recent Labs  Lab 12/24/17 0500 12/25/17 0718 12/26/17 0311 12/27/17 0314 12/28/17 0402 12/29/17 0300  NA 140 139 139 139 140 141  K 3.9 3.5 3.1* 4.5 3.2* 4.0  CL 104 103 104 104 103 107  CO2 24 20* 21* '22 23 22  '$ GLUCOSE 73 155* 182* 190* 129* 179*  BUN 37* 36* 33* 29* 27* 25*  CREATININE 1.16* 1.14* 0.99 0.84 0.71 0.71  CALCIUM 8.3* 8.4* 8.5* 8.7* 9.0 9.2  MG 1.7 2.2 2.0  --   --   --   PHOS 4.9* 4.9*  --   --   --   --    GFR: Estimated Creatinine Clearance: 61.5 mL/min (by C-G formula based on SCr of 0.71 mg/dL). Liver Function Tests: Recent Labs  Lab 12/26/17 0311  AST 50*  ALT 43  ALKPHOS 327*  BILITOT 0.4  PROT 5.7*  ALBUMIN 1.8*   No results for input(s): LIPASE, AMYLASE in the last 168 hours. Recent Labs  Lab 12/26/17 0311  AMMONIA 29   Coagulation Profile: No results for input(s): INR, PROTIME in the last 168 hours. Cardiac Enzymes: No results for input(s): CKTOTAL, CKMB, CKMBINDEX, TROPONINI in the last 168 hours. BNP (last 3 results) Recent Labs    04/28/17 1110  PROBNP 62.0   HbA1C: No results for input(s): HGBA1C in the last 72 hours. CBG: Recent Labs  Lab 12/29/17 1540 12/29/17 2010 12/30/17 0019 12/30/17 0405 12/30/17 0757  GLUCAP 160* 134* 176* 204* 181*   Lipid Profile: No results for input(s): CHOL, HDL, LDLCALC, TRIG, CHOLHDL, LDLDIRECT in the last 72 hours. Thyroid Function Tests: No  results for input(s): TSH, T4TOTAL, FREET4, T3FREE, THYROIDAB in the last 72 hours. Anemia Panel: Recent Labs    12/28/17 0402  VITAMINB12 662  FOLATE 19.3  FERRITIN 348*  TIBC 328  IRON 48  RETICCTPCT 4.5*      Radiology Studies: I have reviewed all of the imaging during this hospital visit personally     Scheduled Meds: . arformoterol  15 mcg Nebulization BID  . baclofen  10 mg Per Tube TID  . budesonide (PULMICORT) nebulizer solution  0.5 mg Nebulization BID  . chlorhexidine gluconate (MEDLINE KIT)  15 mL Mouth Rinse BID  . famotidine  20 mg Per Tube QHS  . feeding supplement (PRO-STAT SUGAR FREE 64)  30 mL Oral Daily  . furosemide  20 mg Intravenous Q12H  .  Gerhardt's butt cream   Topical QID  . heparin  5,000 Units Subcutaneous Q8H  . insulin aspart  0-20 Units Subcutaneous Q4H  . insulin detemir  20 Units Subcutaneous BID  . ipratropium  0.5 mg Nebulization Q6H  . lactulose  10 g Per Tube BID  . levalbuterol  1.25 mg Nebulization Q8H  . mouth rinse  15 mL Mouth Rinse 10 times per day  . midodrine  10 mg Per Tube TID WC  . oxyCODONE  5 mg Per Tube TID  . polyethylene glycol  17 g Oral Daily  . potassium chloride  20 mEq Per Tube BID   Continuous Infusions: . sodium chloride Stopped (12/28/17 1547)  . feeding supplement (VITAL AF 1.2 CAL) 45 mL/hr at 12/30/17 0600     LOS: 14 days        Mauricio Gerome Apley, MD [facility] Pager [phone]

## 2017-12-30 NOTE — Consult Note (Signed)
[place] Nurse wound consult note Patient receiving care in [facility] 2M13. Reason for Consult: Leaking suprapubic cath area.  After speaking with the primary RN, the consult is no longer needed.  The night shift RN cleansed around the cath insertion site, then placed a barrier ring around the insertion stie and cut a 4x4 foam pad and placed over the ring and secured the tube.  There has not been any further leakage.  Val Riles, RN, MSN, CWOCN, CNS-BC, pager [phone]

## 2017-12-31 ENCOUNTER — Institutional Professional Consult (permissible substitution) (HOSPITAL_COMMUNITY): Payer: Medicare Other

## 2017-12-31 ENCOUNTER — Inpatient Hospital Stay (HOSPITAL_COMMUNITY): Payer: Medicare Other

## 2017-12-31 ENCOUNTER — Inpatient Hospital Stay
Admission: AD | Admit: 2017-12-31 | Discharge: 2018-01-28 | Disposition: A | Discharge status: Skilled Nursing Facility | Payer: Medicare Other | Source: Other Acute Inpatient Hospital | Attending: Internal Medicine | Admitting: Internal Medicine

## 2017-12-31 DIAGNOSIS — G8250 Quadriplegia, unspecified: Secondary | ICD-10-CM | POA: Diagnosis present

## 2017-12-31 DIAGNOSIS — J9620 Acute and chronic respiratory failure, unspecified whether with hypoxia or hypercapnia: Secondary | ICD-10-CM | POA: Diagnosis present

## 2017-12-31 DIAGNOSIS — J9621 Acute and chronic respiratory failure with hypoxia: Secondary | ICD-10-CM

## 2017-12-31 DIAGNOSIS — F331 Major depressive disorder, recurrent, moderate: Secondary | ICD-10-CM

## 2017-12-31 DIAGNOSIS — R509 Fever, unspecified: Secondary | ICD-10-CM

## 2017-12-31 DIAGNOSIS — J181 Lobar pneumonia, unspecified organism: Secondary | ICD-10-CM | POA: Diagnosis present

## 2017-12-31 DIAGNOSIS — J189 Pneumonia, unspecified organism: Secondary | ICD-10-CM

## 2017-12-31 DIAGNOSIS — Z9189 Other specified personal risk factors, not elsewhere classified: Secondary | ICD-10-CM

## 2017-12-31 DIAGNOSIS — J9690 Respiratory failure, unspecified, unspecified whether with hypoxia or hypercapnia: Secondary | ICD-10-CM

## 2017-12-31 DIAGNOSIS — L89300 Pressure ulcer of unspecified buttock, unstageable: Secondary | ICD-10-CM | POA: Diagnosis present

## 2017-12-31 DIAGNOSIS — Z930 Tracheostomy status: Secondary | ICD-10-CM

## 2017-12-31 DIAGNOSIS — Z931 Gastrostomy status: Secondary | ICD-10-CM

## 2017-12-31 DIAGNOSIS — K567 Ileus, unspecified: Secondary | ICD-10-CM

## 2017-12-31 HISTORY — DX: Pressure ulcer of unspecified buttock, unstageable: L89.300

## 2017-12-31 HISTORY — DX: Lobar pneumonia, unspecified organism: J18.1

## 2017-12-31 HISTORY — DX: Acute and chronic respiratory failure with hypoxia: J96.21

## 2017-12-31 HISTORY — DX: Tracheostomy status: Z93.0

## 2017-12-31 HISTORY — DX: Other specified personal risk factors, not elsewhere classified: Z91.89

## 2017-12-31 HISTORY — DX: Quadriplegia, unspecified: G82.50

## 2017-12-31 LAB — CBC WITH DIFFERENTIAL/PLATELET
Abs Immature Granulocytes: 0.08 10*3/uL — ABNORMAL HIGH (ref 0.00–0.07)
BASOS ABS: 0.1 10*3/uL (ref 0.0–0.1)
Basophils Relative: 1 %
Eosinophils Absolute: 0.2 10*3/uL (ref 0.0–0.5)
Eosinophils Relative: 1 %
HCT: 24.2 % — ABNORMAL LOW (ref 36.0–46.0)
Hemoglobin: 7.2 g/dL — ABNORMAL LOW (ref 12.0–15.0)
Immature Granulocytes: 1 %
LYMPHS PCT: 23 %
Lymphs Abs: 2.5 10*3/uL (ref 0.7–4.0)
MCH: 28.5 pg (ref 26.0–34.0)
MCHC: 29.8 g/dL — ABNORMAL LOW (ref 30.0–36.0)
MCV: 95.7 fL (ref 80.0–100.0)
Monocytes Absolute: 0.6 10*3/uL (ref 0.1–1.0)
Monocytes Relative: 6 %
NEUTROS ABS: 7.7 10*3/uL (ref 1.7–7.7)
Neutrophils Relative %: 68 %
Platelets: 516 10*3/uL — ABNORMAL HIGH (ref 150–400)
RBC: 2.53 MIL/uL — ABNORMAL LOW (ref 3.87–5.11)
RDW: 18.5 % — ABNORMAL HIGH (ref 11.5–15.5)
WBC: 11.1 10*3/uL — ABNORMAL HIGH (ref 4.0–10.5)
nRBC: 0.4 % — ABNORMAL HIGH (ref 0.0–0.2)

## 2017-12-31 LAB — BASIC METABOLIC PANEL
Anion gap: 13 (ref 5–15)
BUN: 31 mg/dL — ABNORMAL HIGH (ref 6–20)
CO2: 21 mmol/L — ABNORMAL LOW (ref 22–32)
Calcium: 9.5 mg/dL (ref 8.9–10.3)
Chloride: 105 mmol/L (ref 98–111)
Creatinine, Ser: 0.73 mg/dL (ref 0.44–1.00)
GFR calc Af Amer: >60 mL/min (ref >60)
GFR calc non Af Amer: >60 mL/min (ref >60)
Glucose, Bld: 126 mg/dL — ABNORMAL HIGH (ref 70–99)
Potassium: 4.3 mmol/L (ref 3.5–5.1)
Sodium: 139 mmol/L (ref 135–145)

## 2017-12-31 LAB — GLUCOSE, CAPILLARY
Glucose-Capillary: 125 mg/dL — ABNORMAL HIGH (ref 70–99)
Glucose-Capillary: 138 mg/dL — ABNORMAL HIGH (ref 70–99)
Glucose-Capillary: 131 mg/dL — ABNORMAL HIGH (ref 70–99)
Glucose-Capillary: 103 mg/dL — ABNORMAL HIGH (ref 70–99)

## 2017-12-31 MED ORDER — FUROSEMIDE 40 MG PO TABS: 40.0000 mg | ORAL_TABLET | Freq: Every day | ORAL | 0 refills | Status: DC

## 2017-12-31 MED ORDER — LEVALBUTEROL HCL 1.25 MG/0.5ML IN NEBU: 1.2500 mg | INHALATION_SOLUTION | Freq: Four times a day (QID) | RESPIRATORY_TRACT | 0 refills | Status: DC

## 2017-12-31 MED ORDER — LEVALBUTEROL HCL 1.25 MG/0.5ML IN NEBU
1.2500 mg | INHALATION_SOLUTION | Freq: Four times a day (QID) | RESPIRATORY_TRACT | Status: DC
  Filled 2017-12-31: qty 0.5

## 2017-12-31 MED ORDER — POLYETHYLENE GLYCOL 3350 17 G PO PACK: 17.0000 g | PACK | Freq: Every day | ORAL | 0 refills | Status: AC

## 2017-12-31 MED ORDER — BUDESONIDE 0.5 MG/2ML IN SUSP: 0.5000 mg | Freq: Two times a day (BID) | RESPIRATORY_TRACT | 0 refills | Status: DC

## 2017-12-31 MED ORDER — ARFORMOTEROL TARTRATE 15 MCG/2ML IN NEBU: 15.0000 ug | INHALATION_SOLUTION | Freq: Two times a day (BID) | RESPIRATORY_TRACT | 0 refills | Status: DC

## 2017-12-31 MED ORDER — INSULIN DETEMIR 100 UNIT/ML ~~LOC~~ SOLN: 20.0000 [IU] | Freq: Every day | SUBCUTANEOUS | 0 refills | Status: DC

## 2017-12-31 MED ORDER — LACTULOSE 10 GM/15ML PO SOLN: 10.0000 g | Freq: Two times a day (BID) | ORAL | 0 refills | Status: DC

## 2017-12-31 MED ORDER — OXYBUTYNIN CHLORIDE 5 MG PO TABS: 5.0000 mg | ORAL_TABLET | Freq: Three times a day (TID) | ORAL | 0 refills | Status: DC | PRN

## 2017-12-31 MED ORDER — FAMOTIDINE 40 MG/5ML PO SUSR: 20.0000 mg | Freq: Every day | ORAL | 0 refills | Status: DC

## 2017-12-31 MED ORDER — ALPRAZOLAM 0.25 MG PO TABS: 0.2500 mg | ORAL_TABLET | Freq: Three times a day (TID) | ORAL | 0 refills | Status: DC | PRN

## 2017-12-31 MED ORDER — INSULIN DETEMIR 100 UNIT/ML ~~LOC~~ SOLN: 20.0000 [IU] | Freq: Every day | SUBCUTANEOUS | Status: DC

## 2017-12-31 MED ORDER — MIDODRINE HCL 10 MG PO TABS: 10.0000 mg | ORAL_TABLET | Freq: Three times a day (TID) | ORAL | 0 refills | Status: AC

## 2017-12-31 MED ORDER — GERHARDT'S BUTT CREAM: 1.0000 "application " | TOPICAL_CREAM | Freq: Four times a day (QID) | CUTANEOUS | 0 refills | Status: AC

## 2017-12-31 MED ORDER — INSULIN ASPART 100 UNIT/ML ~~LOC~~ SOLN: 10.0000 [IU] | SUBCUTANEOUS | 0 refills | Status: AC

## 2017-12-31 MED ORDER — LEVALBUTEROL HCL 1.25 MG/0.5ML IN NEBU
1.2500 mg | INHALATION_SOLUTION | Freq: Four times a day (QID) | RESPIRATORY_TRACT | Status: DC
  Administered 2017-12-31: 1.25 mg via RESPIRATORY_TRACT
  Filled 2017-12-31 (×4): qty 0.5

## 2017-12-31 MED ORDER — IBUPROFEN 100 MG/5ML PO SUSP: 400.0000 mg | Freq: Four times a day (QID) | ORAL | 0 refills | Status: AC | PRN

## 2017-12-31 MED ORDER — ACETAMINOPHEN 160 MG/5ML PO SOLN: 650.0000 mg | Freq: Four times a day (QID) | ORAL | 0 refills | Status: DC | PRN

## 2017-12-31 MED ORDER — IPRATROPIUM BROMIDE 0.02 % IN SOLN: 0.5000 mg | Freq: Four times a day (QID) | RESPIRATORY_TRACT | 0 refills | Status: DC

## 2017-12-31 MED ORDER — OXYCODONE HCL 5 MG/5ML PO SOLN: 5.0000 mg | Freq: Three times a day (TID) | ORAL | 0 refills | Status: DC

## 2017-12-31 MED ORDER — IOPAMIDOL (ISOVUE-300) INJECTION 61%
INTRAVENOUS | Status: AC
  Filled 2017-12-31: qty 50

## 2017-12-31 MED ORDER — BACLOFEN 10 MG PO TABS: 10.0000 mg | ORAL_TABLET | Freq: Three times a day (TID) | ORAL | 0 refills | Status: DC

## 2017-12-31 NOTE — Progress Notes (Signed)
Report called to Debbora Dus [facility] at [facility].

## 2017-12-31 NOTE — Care Management Important Message (Signed)
Important Message  Patient Details  Name: Angela Page MRN: [id number] Date of Birth: 1970-01-24   Medicare Important Message Given:  Yes(Gave to Mom)    Maryclare Labrador, RN 12/31/2017, 5:03 PM

## 2017-12-31 NOTE — Care Management Note (Signed)
Case Management Note  Patient Details  Name: Angela Page MRN: [id number] Date of Birth: 1970-01-24  Subjective/Objective:   Pt admitted post being found unresponsive at home                Action/Plan:  PTA quad from home chronic trach/peg.  Pt has 24 hour care in the home   Expected Discharge Date:  12/31/17               Expected Discharge Plan:  (From home with caregivers)  In-House Referral:  Clinical Social Work  Discharge planning Services  CM Consult  Post Acute Care Choice:    Choice offered to:     DME Arranged:    DME Agency:     HH Arranged:    [place] Agency:     Status of Service:     If discussed at [employer], dates discussed:    Additional Comments: 12/31/2017  Pt deemed stable to discharge to [facility].  Bedside nurse informed mom that pt would discharge to [facility] today.  12/29/17 Pt and mom chose [facility] - [facility] notified.  [facility] can offer bed today.  CM discussed discharge plan with attending - pt not yet stable for discharge today.  CM will continue to follow   12/31/2017  Pts mom spoke with both facilities - planning on touring facilities possibly tomorrow  [facility] referral received - LOS members in agreement.  CM retrieved medicare.gov approved LTACH facilities based on pts zip code.   CM provided both facilities referral - both [facility] and [facility] can offer bed to pt today.  CM discussed LTACH discharge plan with pts mom and pt as pts orientation is in question - CM provided choice and pts mom would like to discuss options with each facility.  12/28/17 Pt remains on vent via trach at this time.  CM confirmed with pts mom that pt was never on the vent at home - pts mom informed CM that she is not able to care for pt if she needs the vent at discharge.  Pt stays with mom and per mom she has all needed equipment , oxygen and trach supplies provided by [facility].  Pt will need PTAR transportation home.    12/23/17 Pt remains on ventilator  (not on vent PTA).  Pt remains on IV antibiotics and hypertonic solutions with worsening right lower lobe collapse.  CM discussed pt with PCCM team - pt is too unstable at this time to even consider vent at home.  Pt remains a FULL Code.  Pt is active with [facility] 16 hours for [facility]. CM will continue to follow  Maryclare Labrador, RN 12/31/2017, 5:00 PM

## 2017-12-31 NOTE — Discharge Summary (Signed)
Physician Discharge Summary  Angela Page JOA:[id number] DOB: [birth year] DOA: 12/16/2017  PCP: Briscoe Deutscher, DO  Admit date: 12/16/2017 Discharge date: 12/31/2017  Admitted From: Home  Disposition:  [facility]  Recommendations for Outpatient Follow-up and new medication changes:  1. Follow up on abdominal distention, currently peg tube to low intermittent suction and tube feedings are on hold.  2. Continue on pressure control, mandatory mechanical ventilation (pressure 25 cmH20)  Home Health: na   Equipment/Devices: na    Discharge Condition: stable  CODE STATUS: full  Diet recommendation: Tube feedings.   Brief/Interim Summary: 47 year old female who presented with altered mentation, unresponsiveness, hyperglycemia, dyspnea, nausea and vomiting. 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 Also her medical history includes dyslipidemia, COPD, asthma, GERD, depression, polysubstance dependency, and diastolic heart failure.  On her initial physical examination blood pressure 98/68, heart rate 85, respiratory rate 15, oxygen saturation 90%, she had diffuse rhonchi, S1-S2 present and rhythmic, abdomen nontender, no lower extremity edema, patient was comatose.  Her sodium was 126, potassium 3.2, chloride 85, bicarb 22, glucose 1637, BUN 41, creatinine 1.81, white count 23.9, hemoglobin 13.3, hematocrit 51.3, platelets 702. Chest x-ray was negative for infiltrates.  Right base atelectasis.  Patient was admitted to the hospital with a working diagnosis of hyperglycemic hyperosmolar nonketotic state.   Patient had a prolonged hospital stay, she developed Acinetobacter and Staphylococcus aureus multifocal pneumonia, and required to be placed on invasive mechanical ventilation.  1.  Acute on chronic hypoxic respiratory failure status post tracheostomy.  Patient was  placed on mechanical ventilation due to worsening respiratory stress related to multifocal pneumonia, currently she has not been able to be liberated from mechanical ventilation, she is on pressure control mandatory ventilation, 25 cm of water with a PEEP of 5, 40% FiO2, tidal volumes below 500 with no dyssynchrony.  Continue weaning per protocol.  2.  Acinetobacter and Staphylococcus aureus multifocal pneumonia. (Not present on admission) patient received complete course of antibiotic therapy with meropenem, with good toleration, currently patient is afebrile, no significant leukocytosis.  3.  Ileus.  Patient has developed ileus, positive stools and Flexi-Seal but positive abdominal distention, feeds have been discontinued, PEG tube has been connected to low intermittent suction.  Continue close monitoring.  4.  COPD.  Continue bronchodilator therapy, no acute signs of exacerbation, on budesonide and arformoterol.  5.  Metabolic encephalopathy.  Clinically has improved, patient is able to communicate, follow commands, no agitation.  6.  Chronic diastolic heart failure.  No exacerbation, continue furosemide per PEG tube.  7.  Quadriplegia, located by urine retention, suprapubic catheter.  Continue catheter care, midodrine for blood pressure, pain control with fentanyl, ibuprofen, baclofen and oxycodone.  8.  Normocytic anemia.  Stable hemoglobin and hematocrit, no indication for blood transfusion.  9.  Protein calorie malnutrition, unspecified.  Continue nutritional support, tube feeds currently on hold due to ileus.  Resume when tolerated.  10.  Type 2 diabetes mellitus with uncontrolled hyperglycemia and hyperosmolar state.  Her glucose was controlled with insulin, initially intravenously then she succesfully transition to subcutaneous, currently on 20 units of Levemir daily plus insulin sliding scale.  Her fasting glucose this morning is 126, cautious insulin therapy since patient tube feeds are  held.  11.  Pressure ulcers, present on admission.  Multiple lesions, right ischial tuberosity stage IV, sacrum stage II and unstageable.  Discharge Diagnoses:  Principal Problem:   Hyperglycemic hyperosmolar nonketotic coma ([place]) Active Problems:   Depression   Quadriplegia, C5-C7 incomplete (HCC)   Leukocytosis   Hypokalemia   Hypotension   Tobacco abuse   Asthma   Tracheostomy status (HCC)   Chronic diastolic CHF (congestive heart failure) (HCC)   Chronic obstructive pulmonary disease (HCC)   Presence of permanent cardiac pacemaker   PEG (percutaneous endoscopic gastrostomy) status (HCC)   Lactic acid acidosis   AKI (acute kidney injury) ([place])   Suprapubic catheter ([place])   HHNC (hyperglycemic hyperosmolar nonketotic coma) ([place])    Discharge Instructions   Allergies as of 12/31/2017      Reactions   Erythromycin Nausea And Vomiting   Nitrofurantoin Monohyd Macro Nausea And Vomiting   Oxybutynin    Dries mouth out    Penicillins Hives   Has patient had a PCN reaction causing immediate rash, facial/tongue/throat swelling, SOB or lightheadedness with hypotension: Yes Has patient had a PCN reaction causing severe rash involving mucus membranes or skin necrosis: Yes Has patient had a PCN reaction that required hospitalization No Has patient had a PCN reaction occurring within the last 10 years: No-childhood allergy If all of the above answers are "NO", then may proceed with Cephalosporin       Medication List    STOP taking these medications   acetaminophen 500 MG tablet Commonly known as:  TYLENOL Replaced by:  acetaminophen 160 MG/5ML solution   albuterol (2.5 MG/3ML) 0.083% nebulizer solution Commonly known as:  PROVENTIL   alum & mag hydroxide-simeth 400-400-40 MG/5ML suspension Commonly known as:  MAALOX PLUS   BALMEX SKIN PROTECTANT EX   [employer] MELATONIN 5 MG Tabs Generic drug:  Melatonin   feeding supplement (OSMOLITE 1.2 CAL) Liqd   fenofibrate 145 MG  tablet Commonly known as:  TRICOR   free water Soln   ibuprofen 200 MG tablet Commonly known as:  ADVIL,MOTRIN Replaced by:  ibuprofen 100 MG/5ML suspension   KLOR-CON M20 20 MEQ tablet Generic drug:  potassium chloride SA   lidocaine 5 % Commonly known as:  LIDODERM   linaclotide 72 MCG capsule Commonly known as:  LINZESS   magnesium citrate Soln   magnesium hydroxide 400 MG/5ML suspension Commonly known as:  MILK OF MAGNESIA   nystatin 100000 UNIT/ML suspension Commonly known as:  MYCOSTATIN   omeprazole 20 MG capsule Commonly known as:  PRILOSEC   ondansetron 4 MG tablet Commonly known as:  ZOFRAN   Oxycodone HCl 10 MG Tabs Replaced by:  oxyCODONE 5 MG/5ML solution   OXYGEN   polyethylene glycol powder powder Commonly known as:  GLYCOLAX/MIRALAX Replaced by:  polyethylene glycol packet   RELISTOR 150 MG Tabs Generic drug:  Methylnaltrexone Bromide   tiotropium 18 MCG inhalation capsule Commonly known as:  SPIRIVA HANDIHALER   zolpidem 10 MG tablet Commonly known as:  AMBIEN     TAKE these medications   acetaminophen 160 MG/5ML solution Commonly known as:  TYLENOL Place 20.3 mLs (650 mg total) into feeding tube every 6 (six) hours as needed for mild pain, headache or fever. Replaces:  acetaminophen 500 MG tablet   ALPRAZolam 0.25 MG tablet Commonly known as:  XANAX Place 1 tablet (0.25 mg total) into feeding tube 3 (three) times daily as needed for anxiety.   arformoterol 15 MCG/2ML Nebu Commonly known as:  BROVANA Take 2 mLs (15 mcg total) by nebulization 2 (two) times daily.   baclofen 10 MG tablet Commonly known as:  LIORESAL Place 1 tablet (  10 mg total) into feeding tube 3 (three) times daily.   budesonide 0.5 MG/2ML nebulizer solution Commonly known as:  PULMICORT Take 2 mLs (0.5 mg total) by nebulization 2 (two) times daily.   famotidine 40 MG/5ML suspension Commonly known as:  PEPCID Place 2.5 mLs (20 mg total) into feeding tube at  bedtime.   furosemide 40 MG tablet Commonly known as:  LASIX Place 1 tablet (40 mg total) into feeding tube daily. Start taking on:  January 01, 2018 What changed:  how to take this   Gerhardt's butt cream Crea Apply 1 application topically 4 (four) times daily.   ibuprofen 100 MG/5ML suspension Commonly known as:  ADVIL,MOTRIN Place 20 mLs (400 mg total) into feeding tube every 6 (six) hours as needed (intractable fever). Replaces:  ibuprofen 200 MG tablet   insulin aspart 100 UNIT/ML injection Commonly known as:  novoLOG Inject 10 Units into the skin every 4 (four) hours. For glucose 150 to 200 use 1 units, for glucose 201-250 use 2 units, for 251 to 300 use 4 units, for 301 to 350 use 6 units, for 351 to 400 use 8 units, for 401 or greater use 10 units.   insulin detemir 100 UNIT/ML injection Commonly known as:  LEVEMIR Inject 0.2 mLs (20 Units total) into the skin daily. Start taking on:  January 01, 2018   ipratropium 0.02 % nebulizer solution Commonly known as:  ATROVENT Take 2.5 mLs (0.5 mg total) by nebulization every 6 (six) hours.   lactulose 10 GM/15ML solution Commonly known as:  CHRONULAC Place 15 mLs (10 g total) into feeding tube 2 (two) times daily. What changed:    medication strength  how much to take  how to take this   levalbuterol 1.25 MG/0.5ML nebulizer solution Commonly known as:  XOPENEX Take 1.25 mg by nebulization every 6 (six) hours.   midodrine 10 MG tablet Commonly known as:  PROAMATINE Place 1 tablet (10 mg total) into feeding tube 3 (three) times daily with meals.   oxybutynin 5 MG tablet Commonly known as:  DITROPAN Place 1 tablet (5 mg total) into feeding tube every 8 (eight) hours as needed for bladder spasms. What changed:  how to take this   oxyCODONE 5 MG/5ML solution Commonly known as:  ROXICODONE Place 5 mLs (5 mg total) into feeding tube 3 (three) times daily. Replaces:  Oxycodone HCl 10 MG Tabs   polyethylene glycol  packet Commonly known as:  MIRALAX / GLYCOLAX Take 17 g by mouth daily. Hold if diarrhea. Start taking on:  January 01, 2018 Replaces:  polyethylene glycol powder powder       Allergies  Allergen Reactions  . Erythromycin Nausea And Vomiting  . Nitrofurantoin Monohyd Macro Nausea And Vomiting  . Oxybutynin     Dries mouth out   . Penicillins Hives    Has patient had a PCN reaction causing immediate rash, facial/tongue/throat swelling, SOB or lightheadedness with hypotension: Yes Has patient had a PCN reaction causing severe rash involving mucus membranes or skin necrosis: Yes Has patient had a PCN reaction that required hospitalization No Has patient had a PCN reaction occurring within the last 10 years: No-childhood allergy If all of the above answers are "NO", then may proceed with Cephalosporin     Consultations:  Critical care   Procedures/Studies: Ct Abdomen Pelvis Wo Contrast  Result Date: 12/16/2017 CLINICAL DATA:  Abdominal distention. History of quadriplegia, COPD, heart failure. EXAM: CT ABDOMEN AND PELVIS WITHOUT CONTRAST TECHNIQUE: Multidetector CT  imaging of the abdomen and pelvis was performed following the standard protocol without IV contrast. COMPARISON:  03/09/2017. FINDINGS: Lower chest: Right lower lobe collapse/consolidation is progressive comparing to prior study. Patchy ground-glass opacities are seen in the lungs bilaterally, new since prior. Hepatobiliary: Heterogeneous liver attenuation likely related to geographic fatty deposition. Gallbladder is distended without substantial pericholecystic fluid or edema. No intrahepatic or extrahepatic biliary dilation. Pancreas: Insert will pancreas Spleen: No splenomegaly. No focal mass lesion. Adrenals/Urinary Tract: No adrenal nodule or mass. Right kidney unremarkable. 4 mm nonobstructing stone identified lower pole left kidney. Suprapubic tube identified in the urinary bladder. Stomach/Bowel: Stomach is  nondistended. No gastric wall thickening. No evidence of outlet obstruction. Gastrostomy tube tip is in the gastric lumen. Duodenum is normally positioned as is the ligament of Treitz. No small bowel wall thickening. No small bowel dilatation. The appendix is not visualized, but there is no edema or inflammation in the region of the cecum. No gross colonic mass. No colonic wall thickening. Vascular/Lymphatic: There is abdominal aortic atherosclerosis without aneurysm. There is no gastrohepatic or hepatoduodenal ligament lymphadenopathy. No intraperitoneal or retroperitoneal lymphadenopathy. No pelvic sidewall lymphadenopathy. Reproductive: Uterus unremarkable.  There is no adnexal mass. Other: No intraperitoneal free fluid. Musculoskeletal: No worrisome lytic or sclerotic osseous abnormality. IMPRESSION: 1. Interval progression of right lower lobe collapse/consolidation. 2. Interval development of patchy bilateral airspace opacities. Multifocal infection/inflammation suspected. 3. No acute findings in the abdomen or pelvis. No intraperitoneal free air or fluid. No bowel obstruction. 4. Nonobstructing left renal stone. Electronically Signed   By: Misty Stanley M.D.   On: 12/16/2017 15:42   Dg Chest 1 View  Result Date: 12/21/2017 CLINICAL DATA:  Patient with history of pneumonia. EXAM: CHEST  1 VIEW COMPARISON:  Chest radiograph 12/21/2017 FINDINGS: Multi lead pacer apparatus overlies the left hemithorax. Tracheostomy tube terminates in the mid trachea. Monitoring leads overlie the patient. Stable cardiac and mediastinal contours. Similar-appearing multifocal opacities within the left mid and lower lung and right lung base. Small bilateral pleural effusions. No pneumothorax. IMPRESSION: Similar-appearing multifocal opacities concerning for pneumonia in the appropriate clinical setting. Small bilateral pleural effusions. Electronically Signed   By: Lovey Newcomer M.D.   On: 12/21/2017 16:41   Dg Abd 1  View  Result Date: 12/31/2017 CLINICAL DATA:  Abdominal distention. EXAM: ABDOMEN - 1 VIEW COMPARISON:  12/30/2017.  CT 03/09/2017. FINDINGS: Gastrostomy tube noted in stable position. Curvilinear radiopacity is again noted over the pelvis. This could represent a structure within or on the outside the patient, clinical correlation suggested. Loops of small bowel noted. Colon is nondistended. No free air. No acute bony abnormality. IMPRESSION: 1. Distended loops of small bowel. Developing small bowel obstruction could present in this fashion. Follow-up imaging to demonstrate resolution suggested. 2. Linear density noted over the right lung base most likely related to atelectasis as opposed to free intraperitoneal air. Left-side-down lateral decubitus view to exclude free air is suggested however. 3.  Bibasilar atelectasis and/or infiltrates are noted. Critical Value/emergent results were called by telephone at the time of interpretation on 12/31/2017 at 10:11 am to nurse Jodie, who verbally acknowledged these results. Electronically Signed   By: Marcello Moores  Register   On: 12/31/2017 10:07   Dg Abd 1 View  Result Date: 12/30/2017 CLINICAL DATA:  Abdominal distension EXAM: ABDOMEN - 1 VIEW COMPARISON:  CT 12/16/2017 FINDINGS: Gastrostomy tube in the left upper quadrant. Mild diffuse gaseous dilatation of small and large bowel with small bowel dilated up to 3.1 cm.  Rectal gas is present. Clips in the right pelvis. Curvilinear opacity in the central pelvis. IMPRESSION: 1. Mild diffuse gaseous dilatation of small and large bowel suggestive of ileus 2. Curvilinear radiopacity in the central to low pelvis, possible artifact Electronically Signed   By: Donavan Foil M.D.   On: 12/30/2017 16:34   Ct Head Wo Contrast  Result Date: 12/21/2017 CLINICAL DATA:  Altered mental status. EXAM: CT HEAD WITHOUT CONTRAST TECHNIQUE: Contiguous axial images were obtained from the base of the skull through the vertex without  intravenous contrast. COMPARISON:  CT scan of December 16, 2017. FINDINGS: Brain: No evidence of acute infarction, hemorrhage, hydrocephalus, extra-axial collection or mass lesion/mass effect. Vascular: No hyperdense vessel or unexpected calcification. Skull: Normal. Negative for fracture or focal lesion. Sinuses/Orbits: No acute finding. Other: Fluid is noted in right mastoid air cells. IMPRESSION: No acute intracranial abnormality seen. Electronically Signed   By: Marijo Conception, M.D.   On: 12/21/2017 16:00   Ct Head Wo Contrast  Result Date: 12/16/2017 CLINICAL DATA:  Altered level of consciousness (LOC), unexplained EXAM: CT HEAD WITHOUT CONTRAST TECHNIQUE: Contiguous axial images were obtained from the base of the skull through the vertex without intravenous contrast. COMPARISON:  None. FINDINGS: Brain: Generalized cerebral atrophy, mild but age advanced. No intracranial hemorrhage, mass effect, or midline shift. No hydrocephalus. The basilar cisterns are patent. No cerebral edema. No evidence of territorial infarct or acute ischemia. No extra-axial or intracranial fluid collection. Vascular: No hyperdense vessel. Skull: No fracture or focal lesion. Sinuses/Orbits: Minimal opacification of right mastoid air cells. Paranasal sinuses are clear. Visualized orbits are unremarkable. Other: None. IMPRESSION: 1.  No acute intracranial abnormality. 2. Mild, but age advanced generalized atrophy. Electronically Signed   By: Keith Rake M.D.   On: 12/16/2017 06:20   Ir Replace G-tube Simple Wo Fluoro  Result Date: 12/20/2017 INDICATION: Leaking gastrostomy tube.  Was initially placed in November of 2017. EXAM: Gastrostomy tube exchange done at bedside. MEDICATIONS: None ANESTHESIA/SEDATION: No sedation medication given. FLUOROSCOPY TIME:  Fluoroscopy not used. COMPLICATIONS: None immediate. PROCEDURE: Informed consent was obtained from the patient after a thorough discussion of the procedural risks,  benefits and alternatives. The existing gastrostomy tube was removed using gentle traction. A new balloon retention 20 French gastrostomy tube was placed. Gastric contents was aspirated confirming proper position of the gastrostomy tube. It was then flushed with sterile water. IMPRESSION: Successful exchange of 20 French gastrostomy tube. Tube is now ready for use. Read by: Gareth Eagle, PA-C Electronically Signed   By: Jerilynn Mages.  Shick M.D.   On: 12/20/2017 10:57   Dg Chest Port 1 View  Result Date: 12/24/2017 CLINICAL DATA:  Tracheostomy. EXAM: PORTABLE CHEST 1 VIEW COMPARISON:  Portable chest x-ray of 12/22/2016 FINDINGS: Tracheostomy appears to be in good position with the tip overlying the upper tracheal air shadow. However there are parenchymal opacities remaining in both lung bases most consistent with multifocal pneumonia. From pacemaker is present in the heart is mildly enlarged. IMPRESSION: 1. Persistent basilar opacities left-greater-than-right most consistent with multifocal pneumonia with proximal small effusions. 2. Tracheostomy appears to be in good position. 3. Permanent pacemaker remains. Electronically Signed   By: Ivar Drape M.D.   On: 12/24/2017 08:44   Dg Chest Port 1 View  Result Date: 12/22/2017 CLINICAL DATA:  Shortness of breath EXAM: PORTABLE CHEST 1 VIEW COMPARISON:  12/21/2017 FINDINGS: Cardiac shadow is stable. Pacing device is again noted and stable. Tracheostomy tube and postsurgical changes in the cervical  spine are again noted. The lungs are well aerated bilaterally with patchy infiltrates in the bases bilaterally stable from the previous exam. Small effusions are also noted. IMPRESSION: Stable multifocal infiltrates Electronically Signed   By: Inez Catalina M.D.   On: 12/22/2017 08:18   Dg Chest Port 1 View  Result Date: 12/21/2017 CLINICAL DATA:  Follow-up examination for pneumonia. EXAM: PORTABLE CHEST 1 VIEW COMPARISON:  Prior radiograph from 12/20/2017 FINDINGS: Left-sided  pacemaker/AICD in place. Tracheostomy tube partially visualized overlying the upper airway. Stable cardiomegaly. Mediastinal silhouette normal. Lungs mildly hypoinflated. Persistent patchy multifocal opacities within the mid and lower left lung, concerning for infection. Layering right pleural effusion with associated dense right basilar opacity, which could reflect atelectasis or infiltrate. Small layering left pleural effusion present as well. No visible pneumothorax. Osseous structures unchanged. Perihilar vascular congestion without overt pulmonary edema. IMPRESSION: 1. Persistent patchy multifocal opacities within the mid and lower left lung, concerning for pneumonia. 2. Layering right pleural effusion with associated dense right basilar opacity, which could reflect atelectasis or additional infiltrate. 3. Stable cardiomegaly with mild perihilar vascular congestion without overt pulmonary edema. Electronically Signed   By: Jeannine Boga M.D.   On: 12/21/2017 06:32   Dg Chest Port 1 View  Result Date: 12/20/2017 CLINICAL DATA:  Aspiration pneumonia EXAM: PORTABLE CHEST 1 VIEW COMPARISON:  12/18/2017 FINDINGS: There is a tracheostomy tube with tip above carina. Left chest wall pacer device is noted with leads in the right atrial appendage and right ventricle. Normal heart size. Airspace opacities within the left midlung and left base are unchanged compared with previous exam. Airspace consolidation and atelectasis involving much of the right lower lobe is again noted. IMPRESSION: 1. Persistent dense airspace consolidation within the right lower lobe and hazy airspace opacities within the left midlung and left base. Unchanged. Electronically Signed   By: Kerby Moors M.D.   On: 12/20/2017 07:52   Dg Chest Port 1 View  Result Date: 12/18/2017 CLINICAL DATA:  Aspiration pneumonia. EXAM: PORTABLE CHEST 1 VIEW COMPARISON:  Chest x-ray from yesterday. FINDINGS: Unchanged left chest wall pacemaker and  tracheostomy tube. Stable cardiomediastinal silhouette. Normal pulmonary vascularity. Patchy airspace disease appears worse at the right lung base, but is unchanged in the mid and lower left lung. Small bilateral pleural effusions are unchanged. No pneumothorax. No acute osseous abnormality. IMPRESSION: 1. Bilateral aspiration pneumonia, worsened at the right lung base. 2. Unchanged small bilateral pleural effusions. Electronically Signed   By: Titus Dubin M.D.   On: 12/18/2017 07:13   Dg Chest Port 1 View  Result Date: 12/17/2017 CLINICAL DATA:  Hypoxia. EXAM: PORTABLE CHEST 1 VIEW COMPARISON:  12/16/2017 FINDINGS: Heart size is within normal limits. Pacemaker and tracheostomy tube remain in place. New heterogeneous airspace opacity is seen throughout both lungs, which may be due to pulmonary edema or atypical infection. Patient is rotated to the right. IMPRESSION: New heterogeneous bilateral airspace disease, which may be due to pulmonary edema or atypical infection. Electronically Signed   By: Earle Gell M.D.   On: 12/17/2017 02:58   Dg Chest Port 1 View  Result Date: 12/16/2017 CLINICAL DATA:  Hypoxia EXAM: PORTABLE CHEST 1 VIEW COMPARISON:  03/09/2017 FINDINGS: Unchanged right basilar scarring. Left chest wall pacemaker leads are in unchanged position, projecting over the right atrium and right ventricle. Cardiomediastinal contours are normal. IMPRESSION: No active disease. Electronically Signed   By: Ulyses Jarred M.D.   On: 12/16/2017 03:28   Ir Patient Eval Tech 0-60 Mins  Result Date: 12/18/2017 Karenann Cai     12/18/2017 10:58 AM Went bedside to unclog Gastrostomy tube as requested by physician, arrived at bedside and patient was connected to tube feeds with no issues.  Flushed tube with 50 cc of normal saline with no complications.  Spoke with RN and she stated tube flushed this morning and the tube feeds were running with no complications. Spoke with Dr. Annamaria Boots and decided to leave  tube in place.  France Ravens RT R, VI       Subjective: Patient is feeling better, no nausea or vomiting, no further leaking per suprapubic catheter, no chest pain or dyspnea. Tolerating well mechanical ventilation.   Discharge Exam: Vitals:   12/31/17 1200 12/31/17 1208  BP: 107/82 107/82  Pulse: 80 80  Resp: (!) 24 (!) 24  Temp:    SpO2: 100% 100%   Vitals:   12/31/17 1100 12/31/17 1107 12/31/17 1200 12/31/17 1208  BP: 112/81  107/82 107/82  Pulse: 80  80 80  Resp: (!) 24  (!) 24 (!) 24  Temp:  98.2 F (36.8 C)    TempSrc:  Oral    SpO2: 100%  100% 100%  Weight:      Height:       General: deconditioned  Neurology: Awake and alert, non focal  E ENT: mild pallor, no icterus, oral mucosa moist. Trach in place.  Cardiovascular: No JVD. S1-S2 present, rhythmic, no gallops, rubs, or murmurs. No lower extremity edema. Pulmonary: positive breath sounds bilaterally, adequate air movement, no wheezing, rhonchi or rales. Gastrointestinal. Abdomen mild distended, soft, peg tune and suprapubic catheter in place,  no organomegaly, non tender, no rebound or guarding Skin. No rashes Musculoskeletal: no joint deformities    The results of significant diagnostics from this hospitalization (including imaging, microbiology, ancillary and laboratory) are listed below for reference.     Microbiology: No results found for this or any previous visit (from the past 240 hour(s)).   Labs: BNP (last 3 results) Recent Labs    12/16/17 0307  BNP 030.0*   Basic Metabolic Panel: Recent Labs  Lab 12/25/17 0718 12/26/17 0311 12/27/17 0314 12/28/17 0402 12/29/17 0300 12/31/17 0349  NA 139 139 139 140 141 139  K 3.5 3.1* 4.5 3.2* 4.0 4.3  CL 103 104 104 103 107 105  CO2 20* 21* 22 23 22  21*  GLUCOSE 155* 182* 190* 129* 179* 126*  BUN 36* 33* 29* 27* 25* 31*  CREATININE 1.14* 0.99 0.84 0.71 0.71 0.73  CALCIUM 8.4* 8.5* 8.7* 9.0 9.2 9.5  MG 2.2 2.0  --   --   --   --   PHOS 4.9*   --   --   --   --   --    Liver Function Tests: Recent Labs  Lab 12/26/17 0311  AST 50*  ALT 43  ALKPHOS 327*  BILITOT 0.4  PROT 5.7*  ALBUMIN 1.8*   No results for input(s): LIPASE, AMYLASE in the last 168 hours. Recent Labs  Lab 12/26/17 0311  AMMONIA 29   CBC: Recent Labs  Lab 12/26/17 0311 12/27/17 0314 12/28/17 0402 12/29/17 0300 12/31/17 0349  WBC 12.4* 11.8* 13.7* 12.9* 11.1*  NEUTROABS  --   --   --   --  7.7  HGB 7.8* 7.4* 7.2* 7.3* 7.2*  HCT 24.5* 23.6* 23.5* 23.8* 24.2*  MCV 89.1 90.4 91.8 93.3 95.7  PLT 509* 536* 571* 530* 516*   Cardiac Enzymes: No results for  input(s): CKTOTAL, CKMB, CKMBINDEX, TROPONINI in the last 168 hours. BNP: Invalid input(s): POCBNP CBG: Recent Labs  Lab 12/30/17 1925 12/30/17 2330 12/31/17 0320 12/31/17 0758 12/31/17 1104  GLUCAP 178* 127* 125* 138* 131*   D-Dimer No results for input(s): DDIMER in the last 72 hours. Hgb A1c No results for input(s): HGBA1C in the last 72 hours. Lipid Profile No results for input(s): CHOL, HDL, LDLCALC, TRIG, CHOLHDL, LDLDIRECT in the last 72 hours. Thyroid function studies No results for input(s): TSH, T4TOTAL, T3FREE, THYROIDAB in the last 72 hours.  Invalid input(s): FREET3 Anemia work up No results for input(s): VITAMINB12, FOLATE, FERRITIN, TIBC, IRON, RETICCTPCT in the last 72 hours. Urinalysis    Component Value Date/Time   COLORURINE YELLOW 12/16/2017 0342   APPEARANCEUR CLOUDY (A) 12/16/2017 0342   LABSPEC 1.022 12/16/2017 0342   PHURINE 6.0 12/16/2017 0342   GLUCOSEU >=500 (A) 12/16/2017 0342   GLUCOSEU NEGATIVE 04/28/2017 1121   HGBUR LARGE (A) 12/16/2017 0342   BILIRUBINUR NEGATIVE 12/16/2017 0342   KETONESUR 5 (A) 12/16/2017 0342   PROTEINUR 30 (A) 12/16/2017 0342   UROBILINOGEN 0.2 04/28/2017 1121   NITRITE NEGATIVE 12/16/2017 0342   LEUKOCYTESUR MODERATE (A) 12/16/2017 0342   Sepsis Labs Invalid input(s): PROCALCITONIN,  WBC,   LACTICIDVEN Microbiology No results found for this or any previous visit (from the past 240 hour(s)).   Time coordinating discharge: 45 minutes  SIGNED:   Tawni Millers, MD  [facility] 12/31/2017, 12:44 PM Pager [phone]  If 7PM-7AM, please contact night-coverage [email or web address] Password [email or web address]

## 2017-12-31 NOTE — Progress Notes (Signed)
Chaplain requested for patient with anxiety.  Patient not able to talk to express self.  Tears were pouring out from her eyes and she was fighting to hold back emotionally. Her mother was with her and shared that she cannot talk but is feeling anxious.  Chaplain related with her and spoke about God being present with [name] and refers to it many times through Scriptures -do not be afraid, I am with you...etc.  Chaplain referred to big storm at sea with Jesus and disciples on a boat. [place] 4:35)  Jesus was sleeping but everyone else was afraid and woke him up.  He said why are you afraid?  He said peace, be still and the storm calmed down and everything became quiet.  Prayer for [name] and her mother to feel the peace and calm through the storms they are in.  Appreciation expressed for all the staff who are caring for [name] and other patients. Conard Novak, Chaplain   12/31/17 1400  Clinical Encounter Type  Visited With Patient and family together  Visit Type Initial;Spiritual support;Other (Comment) (requested prayer -feeling anxiety)  Referral From Patient  Consult/Referral To Chaplain  Spiritual Encounters  Spiritual Needs Prayer;Emotional  Stress Factors  Patient Stress Factors Other (Comment) (not able to talk-mother says patient is anxious)  Family Stress Factors Health changes (Patient in ICU)

## 2018-01-01 ENCOUNTER — Institutional Professional Consult (permissible substitution) (HOSPITAL_COMMUNITY): Payer: Medicare Other

## 2018-01-01 DIAGNOSIS — L89300 Pressure ulcer of unspecified buttock, unstageable: Secondary | ICD-10-CM

## 2018-01-01 DIAGNOSIS — J9621 Acute and chronic respiratory failure with hypoxia: Secondary | ICD-10-CM

## 2018-01-01 DIAGNOSIS — Z930 Tracheostomy status: Secondary | ICD-10-CM

## 2018-01-01 DIAGNOSIS — G8250 Quadriplegia, unspecified: Secondary | ICD-10-CM

## 2018-01-01 DIAGNOSIS — J181 Lobar pneumonia, unspecified organism: Secondary | ICD-10-CM

## 2018-01-01 DIAGNOSIS — Z9189 Other specified personal risk factors, not elsewhere classified: Secondary | ICD-10-CM

## 2018-01-01 LAB — C DIFFICILE QUICK SCREEN W PCR REFLEX
C Diff antigen: NEGATIVE
C Diff interpretation: NOT DETECTED
C Diff toxin: NEGATIVE

## 2018-01-01 LAB — MAGNESIUM: Magnesium: 2.2 mg/dL (ref 1.7–2.4)

## 2018-01-01 LAB — COMPREHENSIVE METABOLIC PANEL
ALT: 29 U/L (ref 0–44)
AST: 25 U/L (ref 15–41)
Albumin: 2.8 g/dL — ABNORMAL LOW (ref 3.5–5.0)
Alkaline Phosphatase: 167 U/L — ABNORMAL HIGH (ref 38–126)
Anion gap: 15 (ref 5–15)
BUN: 22 mg/dL — ABNORMAL HIGH (ref 6–20)
CO2: 20 mmol/L — ABNORMAL LOW (ref 22–32)
Calcium: 9.4 mg/dL (ref 8.9–10.3)
Chloride: 104 mmol/L (ref 98–111)
Creatinine, Ser: 0.74 mg/dL (ref 0.44–1.00)
GFR calc Af Amer: >60 mL/min (ref >60)
GFR calc non Af Amer: >60 mL/min (ref >60)
Glucose, Bld: 159 mg/dL — ABNORMAL HIGH (ref 70–99)
Potassium: 3.7 mmol/L (ref 3.5–5.1)
Sodium: 139 mmol/L (ref 135–145)
Total Bilirubin: 0.9 mg/dL (ref 0.3–1.2)
Total Protein: 7.4 g/dL (ref 6.5–8.1)

## 2018-01-01 LAB — CBC
HCT: 27 % — ABNORMAL LOW (ref 36.0–46.0)
Hemoglobin: 8 g/dL — ABNORMAL LOW (ref 12.0–15.0)
MCH: 28.9 pg (ref 26.0–34.0)
MCHC: 29.6 g/dL — ABNORMAL LOW (ref 30.0–36.0)
MCV: 97.5 fL (ref 80.0–100.0)
Platelets: 614 10*3/uL — ABNORMAL HIGH (ref 150–400)
RBC: 2.77 MIL/uL — ABNORMAL LOW (ref 3.87–5.11)
RDW: 18.2 % — ABNORMAL HIGH (ref 11.5–15.5)
WBC: 12 10*3/uL — ABNORMAL HIGH (ref 4.0–10.5)
nRBC: 0.2 % (ref 0.0–0.2)

## 2018-01-01 NOTE — Consult Note (Signed)
Pulmonary Critical Care Medicine [place]  Date of Service: 01/01/2018  PULMONARY CRITICAL CARE CONSULT   Angela Page  PYP:[id number]  DOB: 01-02-1971   DOA: 12/31/2017  Referring Physician: Merton Border, MD  HPI: Angela Page is a 47 y.o. female seen for follow up of Acute on Chronic Respiratory Failure.  Patient was referred for further management and evaluation.  She came into is on the ventilator.  Unfortunate female who has a history of quadriplegia and chronic encephalopathy.  She was apparently found to have acute diabetic coma with a blood sugar greater than 1000.  At the time of admission she was also in respiratory distress ended up having to be intubated and placed on mechanical ventilation.  Further work-up radiologically revealed that she had lobar pneumonia ultimately cultures grew Acinetobacter as well as Staphylococcus.  Patient was started on aggressive therapy for her diabetes and eventually the sugar did improve.  Right now she appears to be comfortable and is in no significant distress.  Past medical history Quadriplegia Suprapubic catheter placement Urinary retention Depression COPD Chronic diastolic heart failure Malnutrition GERD Chronic pain syndrome  Past surgical history tracheostomy PEG  Social history currently does not smoke or drink no alcohol or drug abuse is noted.  Review of Systems:  ROS performed and is unremarkable other than noted above.  Family History: Non-Contributory to the present illness   Allergies were reviewed on the paper chart patient has multiple allergies please review the chart for details  Medications: Reviewed on Rounds  Physical Exam:  Vitals: Temperature 96.0 pulse 70 respiratory 24 blood pressure 104/62 saturations 100%  Ventilator Settings mode of ventilation pressure assist control FiO2 40% inspiratory pressure 25 PEEP 5 respiratory rate set at 24  . General:  Comfortable at this time . Eyes: Grossly normal lids, irises & conjunctiva . ENT: grossly tongue is normal . Neck: no obvious mass . Cardiovascular: S1-S2 normal no gallop rub . Respiratory: Scattered rhonchi expansion equal . Abdomen: Soft distended nontender . Skin: no rash seen on limited exam . Musculoskeletal: not rigid . Psychiatric:unable to assess . Neurologic: no seizure no involuntary movements         Labs on Admission:  Basic Metabolic Panel: Recent Labs  Lab 01/01/18 1020  NA 139  K 3.7  CL 104  CO2 20*  GLUCOSE 159*  BUN 22*  CREATININE 0.74  CALCIUM 9.4  MG 2.2    No results for input(s): PHART, PCO2ART, PO2ART, HCO3, O2SAT in the last 168 hours.  Liver Function Tests: Recent Labs  Lab 01/01/18 1020  AST 25  ALT 29  ALKPHOS 167*  BILITOT 0.9  PROT 7.4  ALBUMIN 2.8*   No results for input(s): LIPASE, AMYLASE in the last 168 hours. No results for input(s): AMMONIA in the last 168 hours.  CBC: Recent Labs  Lab 01/01/18 1020  WBC 12.0*  HGB 8.0*  HCT 27.0*  MCV 97.5  PLT 614*    Cardiac Enzymes: No results for input(s): CKTOTAL, CKMB, CKMBINDEX, TROPONINI in the last 168 hours.  BNP (last 3 results) No results for input(s): BNP in the last 8760 hours.  ProBNP (last 3 results) No results for input(s): PROBNP in the last 8760 hours.   Radiological Exams on Admission: Dg Abdomen Peg Tube Location  Result Date: 12/31/2017 CLINICAL DATA:  47 year old female with PEG tube. EXAM: ABDOMEN - 1 VIEW COMPARISON:  None. FINDINGS: Is a percutaneous gastrostomy is noted with balloon over the body  of the stomach. Injected contrast through the gastrostomy holes in the gastric fundus. No intraperitoneal spillage of contrast noted. Air-filled loops of small and large bowel is seen. IMPRESSION: Percutaneous gastrostomy with balloon over the body of the stomach. Electronically Signed   By: Anner Crete M.D.   On: 12/31/2017 21:34   Dg Chest Port 1  View  Result Date: 01/01/2018 CLINICAL DATA:  Endotracheal tube placement EXAM: PORTABLE CHEST 1 VIEW COMPARISON:  None. FINDINGS: Tracheostomy tube is in place. The tip is 8.3 cm from the carina. Double lead left subclavian pacemaker device is in place. The heart is borderline enlarged. Normal vascularity. Small bilateral pleural effusions. Bibasilar patchy airspace opacities. No pneumothorax. IMPRESSION: Tracheostomy tube. Left subclavian pacemaker device. Small bilateral pleural effusions and bibasilar patchy airspace disease. Followup PA and lateral chest X-ray is recommended in 3-4 weeks following trial of antibiotic therapy to ensure resolution and exclude underlying malignancy. Electronically Signed   By: Marybelle Killings M.D.   On: 01/01/2018 08:31    Assessment/Plan Active Problems:   Acute on chronic respiratory failure with hypoxia (HCC)   Quadriplegia and quadriparesis (HCC)   Lobar pneumonia (HCC)   At high risk for severe sepsis   Tracheostomy status (HCC)   Decubitus ulcer of buttock, unstageable ([place])   1. Acute on chronic respiratory failure hypoxia right now patient is on full vent support.  The last chest x-ray showed small bilateral effusions and patchy airspace disease patient still has significant increased work of breathing not able to do any weaning.  We will continue to monitor closely. 2. Quadriplegia at baseline we will need to continue with restorative therapy as tolerated. 3. Lobar pneumonia with Acinetobacter and Staphylococcus patient was treated with antibiotics we will continue with supportive care. 4. Chronic tracheostomy this will remain in place as well as her PEG tube and suprapubic catheter is concerned.  We will continue with supportive care 5. Chronic decubitus ulcers wound care to evaluate and manage.  I have personally seen and evaluated the patient, evaluated laboratory and imaging results, formulated the assessment and plan and placed orders. The Patient  requires high complexity decision making for assessment and support.  Case was discussed on Rounds with the Respiratory Therapy Staff Time Spent 34minutes  Allyne Gee, MD [facility] Pulmonary Critical Care Medicine Sleep Medicine

## 2018-01-02 LAB — CBC
HCT: 26.3 % — ABNORMAL LOW (ref 36.0–46.0)
Hemoglobin: 8.1 g/dL — ABNORMAL LOW (ref 12.0–15.0)
MCH: 29.3 pg (ref 26.0–34.0)
MCHC: 30.8 g/dL (ref 30.0–36.0)
MCV: 95.3 fL (ref 80.0–100.0)
Platelets: 537 10*3/uL — ABNORMAL HIGH (ref 150–400)
RBC: 2.76 MIL/uL — ABNORMAL LOW (ref 3.87–5.11)
RDW: 17.9 % — ABNORMAL HIGH (ref 11.5–15.5)
WBC: 10.5 10*3/uL (ref 4.0–10.5)
nRBC: 0 % (ref 0.0–0.2)

## 2018-01-02 LAB — TSH: TSH: 1.993 u[IU]/mL (ref 0.350–4.500)

## 2018-01-02 LAB — BASIC METABOLIC PANEL
Anion gap: 13 (ref 5–15)
BUN: 15 mg/dL (ref 6–20)
CO2: 23 mmol/L (ref 22–32)
Calcium: 9.4 mg/dL (ref 8.9–10.3)
Chloride: 102 mmol/L (ref 98–111)
Creatinine, Ser: 0.62 mg/dL (ref 0.44–1.00)
GFR calc Af Amer: >60 mL/min (ref >60)
Glucose, Bld: 146 mg/dL — ABNORMAL HIGH (ref 70–99)
POTASSIUM: 3.3 mmol/L — AB (ref 3.5–5.1)
Sodium: 138 mmol/L (ref 135–145)

## 2018-01-02 LAB — MAGNESIUM: Magnesium: 2.3 mg/dL (ref 1.7–2.4)

## 2018-01-02 LAB — PHOSPHORUS: Phosphorus: 4.4 mg/dL (ref 2.5–4.6)

## 2018-01-02 LAB — HEMOGLOBIN A1C
Hgb A1c MFr Bld: 8.5 % — ABNORMAL HIGH (ref 4.8–5.6)
Mean Plasma Glucose: 197.25 mg/dL

## 2018-01-03 LAB — POTASSIUM: Potassium: 3.8 mmol/L (ref 3.5–5.1)

## 2018-01-04 ENCOUNTER — Other Ambulatory Visit (HOSPITAL_COMMUNITY): Payer: Medicare Other

## 2018-01-04 ENCOUNTER — Encounter: Payer: Self-pay | Admitting: Internal Medicine

## 2018-01-04 DIAGNOSIS — J9620 Acute and chronic respiratory failure, unspecified whether with hypoxia or hypercapnia: Secondary | ICD-10-CM | POA: Diagnosis present

## 2018-01-04 DIAGNOSIS — J9621 Acute and chronic respiratory failure with hypoxia: Secondary | ICD-10-CM

## 2018-01-04 DIAGNOSIS — G8250 Quadriplegia, unspecified: Secondary | ICD-10-CM | POA: Diagnosis present

## 2018-01-04 DIAGNOSIS — L89300 Pressure ulcer of unspecified buttock, unstageable: Secondary | ICD-10-CM | POA: Diagnosis present

## 2018-01-04 DIAGNOSIS — Z9189 Other specified personal risk factors, not elsewhere classified: Secondary | ICD-10-CM

## 2018-01-04 DIAGNOSIS — Z930 Tracheostomy status: Secondary | ICD-10-CM

## 2018-01-04 DIAGNOSIS — J181 Lobar pneumonia, unspecified organism: Secondary | ICD-10-CM | POA: Diagnosis present

## 2018-01-04 NOTE — Progress Notes (Signed)
Pulmonary Critical Care Medicine [place]   PULMONARY CRITICAL CARE SERVICE  PROGRESS NOTE  Date of Service: 01/04/2018  Hebah Bogosian  LPF:[id number]  DOB: February 07, 1970   DOA: 12/31/2017  Referring Physician: Merton Border, MD  HPI: Natlie Asfour is a 47 y.o. female seen for follow up of Acute on Chronic Respiratory Failure.  Patient is currently weaning has been on pressure support mode and currently is requiring 40% oxygen.  Currently on a PEEP of 5 tolerating this fairly well.  Medications: Reviewed on Rounds  Physical Exam:  Vitals: Temperature 98.5 pulse 83 respiratory rate 30 blood pressure 113/59 saturations 100%  Ventilator Settings mode of ventilation pressure support FiO2 40% tidal volume 436 PEEP 5 pressure support 12  . General: Comfortable at this time . Eyes: Grossly normal lids, irises & conjunctiva . ENT: grossly tongue is normal . Neck: no obvious mass . Cardiovascular: S1 S2 normal no gallop . Respiratory: No rhonchi or rales are noted at this time . Abdomen: soft . Skin: no rash seen on limited exam . Musculoskeletal: not rigid . Psychiatric:unable to assess . Neurologic: no seizure no involuntary movements         Lab Data:   Basic Metabolic Panel: Recent Labs  Lab 01/01/18 1020 01/02/18 0802 01/03/18 0459  NA 139 138  --   K 3.7 3.3* 3.8  CL 104 102  --   CO2 20* 23  --   GLUCOSE 159* 146*  --   BUN 22* 15  --   CREATININE 0.74 0.62  --   CALCIUM 9.4 9.4  --   MG 2.2 2.3  --   PHOS  --  4.4  --     ABG: No results for input(s): PHART, PCO2ART, PO2ART, HCO3, O2SAT in the last 168 hours.  Liver Function Tests: Recent Labs  Lab 01/01/18 1020  AST 25  ALT 29  ALKPHOS 167*  BILITOT 0.9  PROT 7.4  ALBUMIN 2.8*   No results for input(s): LIPASE, AMYLASE in the last 168 hours. No results for input(s): AMMONIA in the last 168 hours.  CBC: Recent Labs  Lab 01/01/18 1020 01/02/18 0802  WBC 12.0* 10.5  HGB  8.0* 8.1*  HCT 27.0* 26.3*  MCV 97.5 95.3  PLT 614* 537*    Cardiac Enzymes: No results for input(s): CKTOTAL, CKMB, CKMBINDEX, TROPONINI in the last 168 hours.  BNP (last 3 results) No results for input(s): BNP in the last 8760 hours.  ProBNP (last 3 results) No results for input(s): PROBNP in the last 8760 hours.  Radiological Exams: Dg Abd Portable 1v  Result Date: 01/04/2018 CLINICAL DATA:  Abdominal pain EXAM: PORTABLE ABDOMEN - 1 VIEW COMPARISON:  None. FINDINGS: There is air throughout most bowel loops. There is a gastrostomy catheter positioned in the stomach region. There is no frank bowel dilatation or air-fluid levels. No free air. There are apparent phleboliths in the pelvis. IMPRESSION: Gastrostomy catheter positioned in the stomach. Air throughout the bowel without frank dilatation. Early ileus is possible. No bowel obstruction evident. No free air appreciable. Electronically Signed   By: Lowella Grip III M.D.   On: 01/04/2018 07:30    Assessment/Plan Active Problems:   Acute on chronic respiratory failure with hypoxia (HCC)   Quadriplegia and quadriparesis (HCC)   Lobar pneumonia (HCC)   At high risk for severe sepsis   Tracheostomy status (HCC)   Decubitus ulcer of buttock, unstageable ([place])   1. Acute on chronic respiratory failure with  hypoxia we will continue with weaning on pressure support mode try to titrate oxygen down as tolerated continue pulmonary toilet supportive care. 2. Quadriplegia with quadriparesis therapy is following along monitor for contractures. 3. Lobar pneumonia treated continue to follow follow-up x-rays. 4. High risk for severe sepsis precautions 5. Tracheostomy status will remain in place 6. Decubitus ulcers wound care to manage   I have personally seen and evaluated the patient, evaluated laboratory and imaging results, formulated the assessment and plan and placed orders. The Patient requires high complexity decision making  for assessment and support.  Case was discussed on Rounds with the Respiratory Therapy Staff  Allyne Gee, MD [facility] Pulmonary Critical Care Medicine Sleep Medicine

## 2018-01-05 ENCOUNTER — Other Ambulatory Visit: Payer: Self-pay | Admitting: Neurology

## 2018-01-05 NOTE — Progress Notes (Signed)
Pulmonary Critical Care Medicine [place]   PULMONARY CRITICAL CARE SERVICE  PROGRESS NOTE  Date of Service: 01/05/2018  Angela Page  KWI:[id number]  DOB: [birth year]   DOA: 12/31/2017  Referring Physician: Merton Border, MD  HPI: Angela Page is a 47 y.o. female seen for follow up of Acute on Chronic Respiratory Failure.  Patient is currently on pressure support mode has been on 40% oxygen she is actually tolerating it well she did 8 hours on pressure support yesterday  Medications: Reviewed on Rounds  Physical Exam:  Vitals: Temperature 98.4 pulse 91 respiratory 24 blood pressure 115/76 saturations 100%  Ventilator Settings currently is on pressure support FiO2 40% tidal volume 416 pressure 12 PEEP 5  . General: Comfortable at this time . Eyes: Grossly normal lids, irises & conjunctiva . ENT: grossly tongue is normal . Neck: no obvious mass . Cardiovascular: S1 S2 normal no gallop . Respiratory: No rhonchi or rales are noted at this time . Abdomen: soft . Skin: no rash seen on limited exam . Musculoskeletal: not rigid . Psychiatric:unable to assess . Neurologic: no seizure no involuntary movements         Lab Data:   Basic Metabolic Panel: Recent Labs  Lab 01/01/18 1020 01/02/18 0802 01/03/18 0459  NA 139 138  --   K 3.7 3.3* 3.8  CL 104 102  --   CO2 20* 23  --   GLUCOSE 159* 146*  --   BUN 22* 15  --   CREATININE 0.74 0.62  --   CALCIUM 9.4 9.4  --   MG 2.2 2.3  --   PHOS  --  4.4  --     ABG: No results for input(s): PHART, PCO2ART, PO2ART, HCO3, O2SAT in the last 168 hours.  Liver Function Tests: Recent Labs  Lab 01/01/18 1020  AST 25  ALT 29  ALKPHOS 167*  BILITOT 0.9  PROT 7.4  ALBUMIN 2.8*   No results for input(s): LIPASE, AMYLASE in the last 168 hours. No results for input(s): AMMONIA in the last 168 hours.  CBC: Recent Labs  Lab 01/01/18 1020 01/02/18 0802  WBC 12.0* 10.5  HGB 8.0* 8.1*  HCT 27.0*  26.3*  MCV 97.5 95.3  PLT 614* 537*    Cardiac Enzymes: No results for input(s): CKTOTAL, CKMB, CKMBINDEX, TROPONINI in the last 168 hours.  BNP (last 3 results) No results for input(s): BNP in the last 8760 hours.  ProBNP (last 3 results) No results for input(s): PROBNP in the last 8760 hours.  Radiological Exams: Dg Abd Portable 1v  Result Date: 01/04/2018 CLINICAL DATA:  Abdominal pain EXAM: PORTABLE ABDOMEN - 1 VIEW COMPARISON:  None. FINDINGS: There is air throughout most bowel loops. There is a gastrostomy catheter positioned in the stomach region. There is no frank bowel dilatation or air-fluid levels. No free air. There are apparent phleboliths in the pelvis. IMPRESSION: Gastrostomy catheter positioned in the stomach. Air throughout the bowel without frank dilatation. Early ileus is possible. No bowel obstruction evident. No free air appreciable. Electronically Signed   By: Lowella Grip III M.D.   On: 01/04/2018 07:30    Assessment/Plan Active Problems:   Acute on chronic respiratory failure with hypoxia (HCC)   Quadriplegia and quadriparesis (HCC)   Lobar pneumonia (HCC)   At high risk for severe sepsis   Tracheostomy status (HCC)   Decubitus ulcer of buttock, unstageable ([place])   1. Acute on chronic respiratory failure hypoxia we will continue  with weaning on pressure support titrate oxygen down as tolerated goal is 12 hours 2. Quadriplegia at baseline continue present management 3. Lobar pneumonia treated clinically improving 4. High risk sepsis stable right now we will continue to monitor 5. Tracheostomy working towards eventual decannulation 6. Decubitus ulcers treated we will continue to follow   I have personally seen and evaluated the patient, evaluated laboratory and imaging results, formulated the assessment and plan and placed orders. The Patient requires high complexity decision making for assessment and support.  Case was discussed on Rounds with the  Respiratory Therapy Staff  Allyne Gee, MD [facility] Pulmonary Critical Care Medicine Sleep Medicine

## 2018-01-06 LAB — BASIC METABOLIC PANEL
Anion gap: 16 — ABNORMAL HIGH (ref 5–15)
BUN: 22 mg/dL — ABNORMAL HIGH (ref 6–20)
CO2: 26 mmol/L (ref 22–32)
Calcium: 10.3 mg/dL (ref 8.9–10.3)
Chloride: 93 mmol/L — ABNORMAL LOW (ref 98–111)
Creatinine, Ser: 0.75 mg/dL (ref 0.44–1.00)
GFR calc Af Amer: >60 mL/min (ref >60)
GFR calc non Af Amer: >60 mL/min (ref >60)
Glucose, Bld: 178 mg/dL — ABNORMAL HIGH (ref 70–99)
Potassium: 3.7 mmol/L (ref 3.5–5.1)
Sodium: 135 mmol/L (ref 135–145)

## 2018-01-06 LAB — CBC
HCT: 30.2 % — ABNORMAL LOW (ref 36.0–46.0)
Hemoglobin: 9.3 g/dL — ABNORMAL LOW (ref 12.0–15.0)
MCH: 29 pg (ref 26.0–34.0)
MCHC: 30.8 g/dL (ref 30.0–36.0)
MCV: 94.1 fL (ref 80.0–100.0)
Platelets: 430 10*3/uL — ABNORMAL HIGH (ref 150–400)
RBC: 3.21 MIL/uL — ABNORMAL LOW (ref 3.87–5.11)
RDW: 16.9 % — ABNORMAL HIGH (ref 11.5–15.5)
WBC: 11.3 10*3/uL — ABNORMAL HIGH (ref 4.0–10.5)
nRBC: 0 % (ref 0.0–0.2)

## 2018-01-06 LAB — MAGNESIUM: Magnesium: 2.1 mg/dL (ref 1.7–2.4)

## 2018-01-06 LAB — AMMONIA: Ammonia: 32 umol/L (ref 9–35)

## 2018-01-06 NOTE — Progress Notes (Signed)
Pulmonary Critical Care Medicine [place]   PULMONARY CRITICAL CARE SERVICE  PROGRESS NOTE  Date of Service: 01/06/2018  Docia Klar  GUR:[id number]  DOB: February 21, 1970   DOA: 12/31/2017  Referring Physician: Merton Border, MD  HPI: Angela Page is a 47 y.o. female seen for follow up of Acute on Chronic Respiratory Failure.  Patient is on pressure support at this time is been doing fine with weaning.  Right now is on 12/5  Medications: Reviewed on Rounds  Physical Exam:  Vitals: Temperature 98.8 pulse 97 respiratory rate 28 blood pressure 144/85 saturations 99%  Ventilator Settings mode ventilation pressure support FiO2 35% pressure support 12 PEEP 5 tidal volume 450  . General: Comfortable at this time . Eyes: Grossly normal lids, irises & conjunctiva . ENT: grossly tongue is normal . Neck: no obvious mass . Cardiovascular: S1 S2 normal no gallop . Respiratory: No rhonchi no rales are noted at this time . Abdomen: soft . Skin: no rash seen on limited exam . Musculoskeletal: not rigid . Psychiatric:unable to assess . Neurologic: no seizure no involuntary movements         Lab Data:   Basic Metabolic Panel: Recent Labs  Lab 01/01/18 1020 01/02/18 0802 01/03/18 0459 01/06/18 0532  NA 139 138  --  135  K 3.7 3.3* 3.8 3.7  CL 104 102  --  93*  CO2 20* 23  --  26  GLUCOSE 159* 146*  --  178*  BUN 22* 15  --  22*  CREATININE 0.74 0.62  --  0.75  CALCIUM 9.4 9.4  --  10.3  MG 2.2 2.3  --  2.1  PHOS  --  4.4  --   --     ABG: No results for input(s): PHART, PCO2ART, PO2ART, HCO3, O2SAT in the last 168 hours.  Liver Function Tests: Recent Labs  Lab 01/01/18 1020  AST 25  ALT 29  ALKPHOS 167*  BILITOT 0.9  PROT 7.4  ALBUMIN 2.8*   No results for input(s): LIPASE, AMYLASE in the last 168 hours. Recent Labs  Lab 01/06/18 0532  AMMONIA 32    CBC: Recent Labs  Lab 01/01/18 1020 01/02/18 0802 01/06/18 0532  WBC 12.0* 10.5  11.3*  HGB 8.0* 8.1* 9.3*  HCT 27.0* 26.3* 30.2*  MCV 97.5 95.3 94.1  PLT 614* 537* 430*    Cardiac Enzymes: No results for input(s): CKTOTAL, CKMB, CKMBINDEX, TROPONINI in the last 168 hours.  BNP (last 3 results) No results for input(s): BNP in the last 8760 hours.  ProBNP (last 3 results) No results for input(s): PROBNP in the last 8760 hours.  Radiological Exams: No results found.  Assessment/Plan Active Problems:   Acute on chronic respiratory failure with hypoxia (HCC)   Quadriplegia and quadriparesis (HCC)   Lobar pneumonia (HCC)   At high risk for severe sepsis   Tracheostomy status (HCC)   Decubitus ulcer of buttock, unstageable ([place])   1. Acute on chronic respiratory failure hypoxia we will continue to wean on pressure support continue pulmonary toilet supportive care. 2. Quadriplegia at baseline we will continue with supportive care 3. Lobar pneumonia treated improving 4. High risk for sepsis stable will monitor 5. Decubitus ulcer continue with wound care management 6. Tracheostomy remains in place   I have personally seen and evaluated the patient, evaluated laboratory and imaging results, formulated the assessment and plan and placed orders. The Patient requires high complexity decision making for assessment and support.  Case  was discussed on Rounds with the Respiratory Therapy Staff  Allyne Gee, MD [facility] Pulmonary Critical Care Medicine Sleep Medicine

## 2018-01-07 NOTE — Progress Notes (Addendum)
Pulmonary Critical Care Medicine [place]   PULMONARY CRITICAL CARE SERVICE  PROGRESS NOTE  Date of Service: 01/07/2018  [name] Behan  UYQ:[id number]  DOB: Dec 09, 1970   DOA: 12/31/2017  Referring Physician: Merton Border, MD  HPI: Siarra Gilkerson is a 47 y.o. female seen for follow up of Acute on Chronic Respiratory Failure.  Patient is currently on trach collar 28%.  She has been evaluated by speech therapy and has been on PMV today.  She is doing excellent and will rest on the vent overnight and start over again tomorrow.  Medications: Reviewed on Rounds  Physical Exam:  Vitals: Pulse 85 respiration 24 blood pressure 96/61 O2 sat 99% temp 96.7  Ventilator Settings patient's not currently on ventilator  . General: Comfortable at this time . Eyes: Grossly normal lids, irises & conjunctiva . ENT: grossly tongue is normal . Neck: no obvious mass . Cardiovascular: S1 S2 normal no gallop . Respiratory: No wheezes or rhonchi noted . Abdomen: soft . Skin: no rash seen on limited exam . Musculoskeletal: not rigid . Psychiatric:unable to assess . Neurologic: no seizure no involuntary movements         Lab Data:   Basic Metabolic Panel: Recent Labs  Lab 01/01/18 1020 01/02/18 0802 01/03/18 0459 01/06/18 0532  NA 139 138  --  135  K 3.7 3.3* 3.8 3.7  CL 104 102  --  93*  CO2 20* 23  --  26  GLUCOSE 159* 146*  --  178*  BUN 22* 15  --  22*  CREATININE 0.74 0.62  --  0.75  CALCIUM 9.4 9.4  --  10.3  MG 2.2 2.3  --  2.1  PHOS  --  4.4  --   --     ABG: No results for input(s): PHART, PCO2ART, PO2ART, HCO3, O2SAT in the last 168 hours.  Liver Function Tests: Recent Labs  Lab 01/01/18 1020  AST 25  ALT 29  ALKPHOS 167*  BILITOT 0.9  PROT 7.4  ALBUMIN 2.8*   No results for input(s): LIPASE, AMYLASE in the last 168 hours. Recent Labs  Lab 01/06/18 0532  AMMONIA 32    CBC: Recent Labs  Lab 01/01/18 1020 01/02/18 0802  01/06/18 0532  WBC 12.0* 10.5 11.3*  HGB 8.0* 8.1* 9.3*  HCT 27.0* 26.3* 30.2*  MCV 97.5 95.3 94.1  PLT 614* 537* 430*    Cardiac Enzymes: No results for input(s): CKTOTAL, CKMB, CKMBINDEX, TROPONINI in the last 168 hours.  BNP (last 3 results) No results for input(s): BNP in the last 8760 hours.  ProBNP (last 3 results) No results for input(s): PROBNP in the last 8760 hours.  Radiological Exams: No results found.  Assessment/Plan Active Problems:   Acute on chronic respiratory failure with hypoxia (HCC)   Quadriplegia and quadriparesis (HCC)   Lobar pneumonia (HCC)   At high risk for severe sepsis   Tracheostomy status (HCC)   Decubitus ulcer of buttock, unstageable ([place])   1. Acute on chronic respiratory failure with hypoxia continue supportive measures and continue trach collar at 20% at this time.  Continue pulmonary toilet. 2. Quadriplegia at baseline continue supportive care 3. Lobar pneumonia treated improving at this time 4. High risk for sepsis stable currently continue to monitor 5. Decubitus ulcer continue wound care management 6. Tracheostomy remains in place.   I have personally seen and evaluated the patient, evaluated laboratory and imaging results, formulated the assessment and plan and placed orders. The Patient requires  high complexity decision making for assessment and support.  Case was discussed on Rounds with the Respiratory Therapy Staff  Allyne Gee, MD [facility] Pulmonary Critical Care Medicine Sleep Medicine

## 2018-01-08 ENCOUNTER — Institutional Professional Consult (permissible substitution) (HOSPITAL_COMMUNITY): Payer: Medicare Other

## 2018-01-08 NOTE — Progress Notes (Signed)
Pulmonary Critical Care Medicine [place]   PULMONARY CRITICAL CARE SERVICE  PROGRESS NOTE  Date of Service: 01/08/2018  Angela Page  URK:[id number]  DOB: [birth year]   DOA: 12/31/2017  Referring Physician: Merton Border, MD  HPI: Angela Page is a 47 y.o. female seen for follow up of Acute on Chronic Respiratory Failure.  Patient is on T collar right now that the goal is for 12 hours.  Looks good comfortable without distress  Medications: Reviewed on Rounds  Physical Exam:  Vitals: Temperature 97.3 pulse 80 respiratory rate 20 blood pressure 134/63 saturations 98%  Ventilator Settings patient is on T collar currently 28% FiO2  . General: Comfortable at this time . Eyes: Grossly normal lids, irises & conjunctiva . ENT: grossly tongue is normal . Neck: no obvious mass . Cardiovascular: S1 S2 normal no gallop . Respiratory: No rhonchi no rales . Abdomen: soft . Skin: no rash seen on limited exam . Musculoskeletal: not rigid . Psychiatric:unable to assess . Neurologic: no seizure no involuntary movements         Lab Data:   Basic Metabolic Panel: Recent Labs  Lab 01/02/18 0802 01/03/18 0459 01/06/18 0532  NA 138  --  135  K 3.3* 3.8 3.7  CL 102  --  93*  CO2 23  --  26  GLUCOSE 146*  --  178*  BUN 15  --  22*  CREATININE 0.62  --  0.75  CALCIUM 9.4  --  10.3  MG 2.3  --  2.1  PHOS 4.4  --   --     ABG: No results for input(s): PHART, PCO2ART, PO2ART, HCO3, O2SAT in the last 168 hours.  Liver Function Tests: No results for input(s): AST, ALT, ALKPHOS, BILITOT, PROT, ALBUMIN in the last 168 hours. No results for input(s): LIPASE, AMYLASE in the last 168 hours. Recent Labs  Lab 01/06/18 0532  AMMONIA 32    CBC: Recent Labs  Lab 01/02/18 0802 01/06/18 0532  WBC 10.5 11.3*  HGB 8.1* 9.3*  HCT 26.3* 30.2*  MCV 95.3 94.1  PLT 537* 430*    Cardiac Enzymes: No results for input(s): CKTOTAL, CKMB, CKMBINDEX, TROPONINI in  the last 168 hours.  BNP (last 3 results) No results for input(s): BNP in the last 8760 hours.  ProBNP (last 3 results) No results for input(s): PROBNP in the last 8760 hours.  Radiological Exams: Dg Abd Portable 1v  Result Date: 01/08/2018 CLINICAL DATA:  Ileus EXAM: PORTABLE ABDOMEN - 1 VIEW COMPARISON:  01/04/2018 FINDINGS: Gastrostomy tube projects over the stomach. Mild diffuse gaseous distention of bowel, slightly improved since prior study, likely improving ileus. Liver appears enlarged. No free air or suspicious calcification. IMPRESSION: Mild diffuse gaseous distention of bowel, improved since prior study. Suspicion for hepatomegaly.  Recommend clinical correlation. Electronically Signed   By: Rolm Baptise M.D.   On: 01/08/2018 07:38    Assessment/Plan Active Problems:   Acute on chronic respiratory failure with hypoxia (HCC)   Quadriplegia and quadriparesis (HCC)   Lobar pneumonia (HCC)   At high risk for severe sepsis   Tracheostomy status (HCC)   Decubitus ulcer of buttock, unstageable ([place])   1. Acute on chronic respiratory failure with hypoxia continue weaning on T collar trials continue secretion management pulmonary toilet. 2. Quadriplegia at baseline continue present management 3. Lobar pneumonia treated clinically improving 4. Tracheostomy working towards getting her back to baseline on T collar. 5. Decubitus ulcer wound care management   I  have personally seen and evaluated the patient, evaluated laboratory and imaging results, formulated the assessment and plan and placed orders. The Patient requires high complexity decision making for assessment and support.  Case was discussed on Rounds with the Respiratory Therapy Staff  Allyne Gee, MD [facility] Pulmonary Critical Care Medicine Sleep Medicine

## 2018-01-09 NOTE — Progress Notes (Addendum)
Pulmonary Critical Care Medicine [place]   PULMONARY CRITICAL CARE SERVICE  PROGRESS NOTE  Date of Service: 01/09/2018  Angela Page  [id number]:[id number]  DOB: 08-09-1970   DOA: 12/31/2017  Referring Physician: Merton Border, MD  HPI: Angela Page is a 47 y.o. female seen for follow up of Acute on Chronic Respiratory Failure.  Patient doing well on 28% trach collar.  She was able to do 16 hours on the trach collar yesterday with today's goal being 24.  She is asking to be off the vent permanently at this time.  She has minimal secretions and is using her Passy-Muir valve with no issues.  Medications: Reviewed on Rounds  Physical Exam:  Vitals: Pulse 104 respirations 22 blood pressure 140/79 O2 sat 95% temp 98.8  Ventilator Settings patient is not currently on ventilator  . General: Comfortable at this time . Eyes: Grossly normal lids, irises & conjunctiva . ENT: grossly tongue is normal . Neck: no obvious mass . Cardiovascular: S1 S2 normal no gallop . Respiratory: No wheezes or rhonchi noted . Abdomen: soft . Skin: no rash seen on limited exam . Musculoskeletal: not rigid . Psychiatric:unable to assess . Neurologic: no seizure no involuntary movements         Lab Data:   Basic Metabolic Panel: Recent Labs  Lab 01/03/18 0459 01/06/18 0532  NA  --  135  K 3.8 3.7  CL  --  93*  CO2  --  26  GLUCOSE  --  178*  BUN  --  22*  CREATININE  --  0.75  CALCIUM  --  10.3  MG  --  2.1    ABG: No results for input(s): PHART, PCO2ART, PO2ART, HCO3, O2SAT in the last 168 hours.  Liver Function Tests: No results for input(s): AST, ALT, ALKPHOS, BILITOT, PROT, ALBUMIN in the last 168 hours. No results for input(s): LIPASE, AMYLASE in the last 168 hours. Recent Labs  Lab 01/06/18 0532  AMMONIA 32    CBC: Recent Labs  Lab 01/06/18 0532  WBC 11.3*  HGB 9.3*  HCT 30.2*  MCV 94.1  PLT 430*    Cardiac Enzymes: No results for input(s):  CKTOTAL, CKMB, CKMBINDEX, TROPONINI in the last 168 hours.  BNP (last 3 results) No results for input(s): BNP in the last 8760 hours.  ProBNP (last 3 results) No results for input(s): PROBNP in the last 8760 hours.  Radiological Exams: Dg Abd Portable 1v  Result Date: 01/08/2018 CLINICAL DATA:  Ileus EXAM: PORTABLE ABDOMEN - 1 VIEW COMPARISON:  01/04/2018 FINDINGS: Gastrostomy tube projects over the stomach. Mild diffuse gaseous distention of bowel, slightly improved since prior study, likely improving ileus. Liver appears enlarged. No free air or suspicious calcification. IMPRESSION: Mild diffuse gaseous distention of bowel, improved since prior study. Suspicion for hepatomegaly.  Recommend clinical correlation. Electronically Signed   By: Rolm Baptise M.D.   On: 01/08/2018 07:38    Assessment/Plan Active Problems:   Acute on chronic respiratory failure with hypoxia (HCC)   Quadriplegia and quadriparesis (HCC)   Lobar pneumonia (HCC)   At high risk for severe sepsis   Tracheostomy status (HCC)   Decubitus ulcer of buttock, unstageable ([place])   1. Acute on chronic respiratory failure with hypoxia continue weaning on trach collar trials.  May use trach collar as tolerated for patient.  Continue secretion management and pulmonary toilet 2. Quadriplegia at baseline continue present management 3. Lobar pneumonia treated, improving at this time 4. Tracheostomy patient doing  well on trach collar currently.  Will allow her to use trach collar as tolerated. 5. Decubitus ulcer wound management   I have personally seen and evaluated the patient, evaluated laboratory and imaging results, formulated the assessment and plan and placed orders. The Patient requires high complexity decision making for assessment and support.  Case was discussed on Rounds with the Respiratory Therapy Staff  Allyne Gee, MD [facility] Pulmonary Critical Care Medicine Sleep Medicine

## 2018-01-10 NOTE — Progress Notes (Addendum)
Pulmonary Critical Care Medicine [place]   PULMONARY CRITICAL CARE SERVICE  PROGRESS NOTE  Date of Service: 01/10/2018  Sukhmani Fetherolf  QMV:[id number]  DOB: 07/09/1970   DOA: 12/31/2017  Referring Physician: Merton Border, MD  HPI: Alfredia Desanctis is a 47 y.o. female seen for follow up of Acute on Chronic Respiratory Failure.  Patient has been over 24 hours now on trach collar at 28%.  She has minimal secretions and overall is doing well.  Medications: Reviewed on Rounds  Physical Exam:  Vitals: Pulse 80 respirations 17 blood pressure 113/65 O2 sat 94% temp 97.3  Ventilator Settings patient's not currently on ventilator  . General: Comfortable at this time . Eyes: Grossly normal lids, irises & conjunctiva . ENT: grossly tongue is normal . Neck: no obvious mass . Cardiovascular: S1 S2 normal no gallop . Respiratory: No wheezes or rhonchi noted . Abdomen: soft . Skin: no rash seen on limited exam . Musculoskeletal: not rigid . Psychiatric:unable to assess . Neurologic: no seizure no involuntary movements         Lab Data:   Basic Metabolic Panel: Recent Labs  Lab 01/06/18 0532  NA 135  K 3.7  CL 93*  CO2 26  GLUCOSE 178*  BUN 22*  CREATININE 0.75  CALCIUM 10.3  MG 2.1    ABG: No results for input(s): PHART, PCO2ART, PO2ART, HCO3, O2SAT in the last 168 hours.  Liver Function Tests: No results for input(s): AST, ALT, ALKPHOS, BILITOT, PROT, ALBUMIN in the last 168 hours. No results for input(s): LIPASE, AMYLASE in the last 168 hours. Recent Labs  Lab 01/06/18 0532  AMMONIA 32    CBC: Recent Labs  Lab 01/06/18 0532  WBC 11.3*  HGB 9.3*  HCT 30.2*  MCV 94.1  PLT 430*    Cardiac Enzymes: No results for input(s): CKTOTAL, CKMB, CKMBINDEX, TROPONINI in the last 168 hours.  BNP (last 3 results) No results for input(s): BNP in the last 8760 hours.  ProBNP (last 3 results) No results for input(s): PROBNP in the last 8760  hours.  Radiological Exams: No results found.  Assessment/Plan Active Problems:   Acute on chronic respiratory failure with hypoxia (HCC)   Quadriplegia and quadriparesis (HCC)   Lobar pneumonia (HCC)   At high risk for severe sepsis   Tracheostomy status (HCC)   Decubitus ulcer of buttock, unstageable ([place])   1. Acute on chronic respiratory failure with hypoxia continue to wean as tolerated.  Continue secretion management pulmonary toilet 2. Quadriplegia at baseline continue present management 3. Lobar pneumonia treated improving at this time 4. Tracheostomy patient doing well on trach collar may continue ATC as tolerated 5. Decubitus ulcer, wound management.   I have personally seen and evaluated the patient, evaluated laboratory and imaging results, formulated the assessment and plan and placed orders. The Patient requires high complexity decision making for assessment and support.  Case was discussed on Rounds with the Respiratory Therapy Staff  Allyne Gee, MD [facility] Pulmonary Critical Care Medicine Sleep Medicine

## 2018-01-11 LAB — CBC
HCT: 30.7 % — ABNORMAL LOW (ref 36.0–46.0)
Hemoglobin: 9.3 g/dL — ABNORMAL LOW (ref 12.0–15.0)
MCH: 28.6 pg (ref 26.0–34.0)
MCHC: 30.3 g/dL (ref 30.0–36.0)
MCV: 94.5 fL (ref 80.0–100.0)
Platelets: 430 10*3/uL — ABNORMAL HIGH (ref 150–400)
RBC: 3.25 MIL/uL — ABNORMAL LOW (ref 3.87–5.11)
RDW: 17 % — ABNORMAL HIGH (ref 11.5–15.5)
WBC: 11.7 10*3/uL — ABNORMAL HIGH (ref 4.0–10.5)
nRBC: 0.3 % — ABNORMAL HIGH (ref 0.0–0.2)

## 2018-01-11 LAB — BASIC METABOLIC PANEL
ANION GAP: 15 (ref 5–15)
BUN: 29 mg/dL — ABNORMAL HIGH (ref 6–20)
CALCIUM: 10.6 mg/dL — AB (ref 8.9–10.3)
CO2: 33 mmol/L — AB (ref 22–32)
Chloride: 88 mmol/L — ABNORMAL LOW (ref 98–111)
Creatinine, Ser: 0.87 mg/dL (ref 0.44–1.00)
GFR calc Af Amer: >60 mL/min (ref >60)
GFR calc non Af Amer: >60 mL/min (ref >60)
Glucose, Bld: 183 mg/dL — ABNORMAL HIGH (ref 70–99)
Potassium: 3.6 mmol/L (ref 3.5–5.1)
Sodium: 136 mmol/L (ref 135–145)

## 2018-01-11 LAB — PHOSPHORUS: Phosphorus: 5.1 mg/dL — ABNORMAL HIGH (ref 2.5–4.6)

## 2018-01-11 LAB — MAGNESIUM: Magnesium: 2.5 mg/dL — ABNORMAL HIGH (ref 1.7–2.4)

## 2018-01-11 NOTE — Progress Notes (Signed)
Pulmonary Critical Care Medicine [place]   PULMONARY CRITICAL CARE SERVICE  PROGRESS NOTE  Date of Service: 01/11/2018  Angela Page  [id number]:[id number]  DOB: 1970-04-26   DOA: 12/31/2017  Referring Physician: Merton Border, MD  HPI: Angela Page is a 47 y.o. female seen for follow up of Acute on Chronic Respiratory Failure.  Currently patient is on T collar has been off the ventilator for more than 48 hours doing fairly well.  The patient is 28% FiO2  Medications: Reviewed on Rounds  Physical Exam:  Vitals: Temperature 97.5 pulse 80 respiratory rate 12 blood pressure 114/66 saturations are 96%  Ventilator Settings mode of ventilation is spontaneous on T-bar FiO2 28%  . General: Comfortable at this time . Eyes: Grossly normal lids, irises & conjunctiva . ENT: grossly tongue is normal . Neck: no obvious mass . Cardiovascular: S1 S2 normal no gallop . Respiratory: No rhonchi no rales are noted . Abdomen: soft . Skin: no rash seen on limited exam . Musculoskeletal: not rigid . Psychiatric:unable to assess . Neurologic: no seizure no involuntary movements         Lab Data:   Basic Metabolic Panel: Recent Labs  Lab 01/06/18 0532 01/11/18 0656  NA 135 136  K 3.7 3.6  CL 93* 88*  CO2 26 33*  GLUCOSE 178* 183*  BUN 22* 29*  CREATININE 0.75 0.87  CALCIUM 10.3 10.6*  MG 2.1 2.5*  PHOS  --  5.1*    ABG: No results for input(s): PHART, PCO2ART, PO2ART, HCO3, O2SAT in the last 168 hours.  Liver Function Tests: No results for input(s): AST, ALT, ALKPHOS, BILITOT, PROT, ALBUMIN in the last 168 hours. No results for input(s): LIPASE, AMYLASE in the last 168 hours. Recent Labs  Lab 01/06/18 0532  AMMONIA 32    CBC: Recent Labs  Lab 01/06/18 0532 01/11/18 0656  WBC 11.3* 11.7*  HGB 9.3* 9.3*  HCT 30.2* 30.7*  MCV 94.1 94.5  PLT 430* 430*    Cardiac Enzymes: No results for input(s): CKTOTAL, CKMB, CKMBINDEX, TROPONINI in the last  168 hours.  BNP (last 3 results) No results for input(s): BNP in the last 8760 hours.  ProBNP (last 3 results) No results for input(s): PROBNP in the last 8760 hours.  Radiological Exams: No results found.  Assessment/Plan Active Problems:   Acute on chronic respiratory failure with hypoxia (HCC)   Quadriplegia and quadriparesis (HCC)   Lobar pneumonia (HCC)   At high risk for severe sepsis   Tracheostomy status (HCC)   Decubitus ulcer of buttock, unstageable ([place])   1. Acute on chronic respiratory failure with hypoxia we will continue with T bar weaning as tolerated.  Titrate oxygen continue pulmonary toilet. 2. Quadriplegia at baseline continue with supportive care 3. Lobar pneumonia treated improving 4. Tracheostomy status will continue with supportive care 5. Decubitus monitor and followed by wound management   I have personally seen and evaluated the patient, evaluated laboratory and imaging results, formulated the assessment and plan and placed orders. The Patient requires high complexity decision making for assessment and support.  Case was discussed on Rounds with the Respiratory Therapy Staff  Allyne Gee, MD [facility] Pulmonary Critical Care Medicine Sleep Medicine

## 2018-01-12 NOTE — Progress Notes (Signed)
Pulmonary Critical Care Medicine [place]   PULMONARY CRITICAL CARE SERVICE  PROGRESS NOTE  Date of Service: 01/12/2018  Yarieliz Wasser  [id number]  DOB: 1970/09/07   DOA: 12/31/2017  Referring Physician: Merton Border, MD  HPI: Angela Page is a 47 y.o. female seen for follow up of Acute on Chronic Respiratory Failure.  On T collar comfortable without distress at this time.  She is back to her baseline  Medications: Reviewed on Rounds  Physical Exam:  Vitals: Temperature 96.6 pulse 85 respiratory 13 blood pressure 99/65 saturations 100%  Ventilator Settings on T collar FiO2 28%  . General: Comfortable at this time . Eyes: Grossly normal lids, irises & conjunctiva . ENT: grossly tongue is normal . Neck: no obvious mass . Cardiovascular: S1 S2 normal no gallop . Respiratory: No rhonchi or rales are noted at this time . Abdomen: soft . Skin: no rash seen on limited exam . Musculoskeletal: not rigid . Psychiatric:unable to assess . Neurologic: no seizure no involuntary movements         Lab Data:   Basic Metabolic Panel: Recent Labs  Lab 01/06/18 0532 01/11/18 0656  NA 135 136  K 3.7 3.6  CL 93* 88*  CO2 26 33*  GLUCOSE 178* 183*  BUN 22* 29*  CREATININE 0.75 0.87  CALCIUM 10.3 10.6*  MG 2.1 2.5*  PHOS  --  5.1*    ABG: No results for input(s): PHART, PCO2ART, PO2ART, HCO3, O2SAT in the last 168 hours.  Liver Function Tests: No results for input(s): AST, ALT, ALKPHOS, BILITOT, PROT, ALBUMIN in the last 168 hours. No results for input(s): LIPASE, AMYLASE in the last 168 hours. Recent Labs  Lab 01/06/18 0532  AMMONIA 32    CBC: Recent Labs  Lab 01/06/18 0532 01/11/18 0656  WBC 11.3* 11.7*  HGB 9.3* 9.3*  HCT 30.2* 30.7*  MCV 94.1 94.5  PLT 430* 430*    Cardiac Enzymes: No results for input(s): CKTOTAL, CKMB, CKMBINDEX, TROPONINI in the last 168 hours.  BNP (last 3 results) No results for input(s): BNP in the  last 8760 hours.  ProBNP (last 3 results) No results for input(s): PROBNP in the last 8760 hours.  Radiological Exams: No results found.  Assessment/Plan Active Problems:   Acute on chronic respiratory failure with hypoxia (HCC)   Quadriplegia and quadriparesis (HCC)   Lobar pneumonia (HCC)   At high risk for severe sepsis   Tracheostomy status (HCC)   Decubitus ulcer of buttock, unstageable ([place])   1. Acute on chronic respiratory failure with hypoxia we will continue with T collar trials continue secretion management pulmonary toilet. 2. Quadriplegia at baseline we will continue present therapy 3. Lobar pneumonia treated we will monitor 4. High risk sepsis hemodynamically stable 5. Tracheostomy remains in place  6. decubiti being treated by the wound team   I have personally seen and evaluated the patient, evaluated laboratory and imaging results, formulated the assessment and plan and placed orders. The Patient requires high complexity decision making for assessment and support.  Case was discussed on Rounds with the Respiratory Therapy Staff  Allyne Gee, MD [facility] Pulmonary Critical Care Medicine Sleep Medicine

## 2018-01-13 ENCOUNTER — Institutional Professional Consult (permissible substitution) (HOSPITAL_COMMUNITY): Payer: Medicare Other

## 2018-01-13 NOTE — Progress Notes (Addendum)
Pulmonary Critical Care Medicine [place]   PULMONARY CRITICAL CARE SERVICE  PROGRESS NOTE  Date of Service: 01/13/2018  Dimple Bastyr  [id number]  DOB: 1970/09/21   DOA: 12/31/2017  Referring Physician: Merton Border, MD  HPI: Angela Page is a 47 y.o. female seen for follow up of Acute on Chronic Respiratory Failure.  Patient is on T collar right now comfortable without distress is on 28% FiO2  Medications: Reviewed on Rounds  Physical Exam:  Vitals: Temperature 97.4 pulse 87 respiratory rate 16 blood pressure 130/90 saturations 97%  Ventilator Settings patient is off the ventilator on T collar  . General: Comfortable at this time . Eyes: Grossly normal lids, irises & conjunctiva . ENT: grossly tongue is normal . Neck: no obvious mass . Cardiovascular: S1 S2 normal no gallop . Respiratory: No rhonchi no rales are noted at this time . Abdomen: soft . Skin: no rash seen on limited exam . Musculoskeletal: not rigid . Psychiatric:unable to assess . Neurologic: no seizure no involuntary movements         Lab Data:   Basic Metabolic Panel: Recent Labs  Lab 01/11/18 0656  NA 136  K 3.6  CL 88*  CO2 33*  GLUCOSE 183*  BUN 29*  CREATININE 0.87  CALCIUM 10.6*  MG 2.5*  PHOS 5.1*    ABG: No results for input(s): PHART, PCO2ART, PO2ART, HCO3, O2SAT in the last 168 hours.  Liver Function Tests: No results for input(s): AST, ALT, ALKPHOS, BILITOT, PROT, ALBUMIN in the last 168 hours. No results for input(s): LIPASE, AMYLASE in the last 168 hours. No results for input(s): AMMONIA in the last 168 hours.  CBC: Recent Labs  Lab 01/11/18 0656  WBC 11.7*  HGB 9.3*  HCT 30.7*  MCV 94.5  PLT 430*    Cardiac Enzymes: No results for input(s): CKTOTAL, CKMB, CKMBINDEX, TROPONINI in the last 168 hours.  BNP (last 3 results) No results for input(s): BNP in the last 8760 hours.  ProBNP (last 3 results) No results for input(s): PROBNP  in the last 8760 hours.  Radiological Exams: No results found.  Assessment/Plan Active Problems:   Acute on chronic respiratory failure with hypoxia (HCC)   Quadriplegia and quadriparesis (HCC)   Lobar pneumonia (HCC)   At high risk for severe sepsis   Tracheostomy status (HCC)   Decubitus ulcer of buttock, unstageable ([place])   1. Acute on chronic respiratory failure with hypoxia we will continue with T collar trials continue secretion management pulmonary toilet. 2. High risk for sepsis stable hemodynamically 3. Lobar pneumonia treated improved 4. Tracheostomy remains in place permanently 5. Decubitus ulcers followed by wound care   I have personally seen and evaluated the patient, evaluated laboratory and imaging results, formulated the assessment and plan and placed orders. The Patient requires high complexity decision making for assessment and support.  Case was discussed on Rounds with the Respiratory Therapy Staff  Allyne Gee, MD [facility] Pulmonary Critical Care Medicine Sleep Medicine

## 2018-01-14 LAB — URINALYSIS, ROUTINE W REFLEX MICROSCOPIC
Bilirubin Urine: NEGATIVE
Glucose, UA: NEGATIVE mg/dL
Hgb urine dipstick: NEGATIVE
KETONES UR: NEGATIVE mg/dL
Nitrite: NEGATIVE
Protein, ur: 30 mg/dL — AB
Specific Gravity, Urine: 1.018 (ref 1.005–1.030)
pH: 6 (ref 5.0–8.0)

## 2018-01-14 NOTE — Progress Notes (Signed)
Pulmonary Critical Care Medicine [place]   PULMONARY CRITICAL CARE SERVICE  PROGRESS NOTE  Date of Service: 01/14/2018  Angela Page  HEN:[id number]  DOB: 02-10-1970   DOA: 12/31/2017  Referring Physician: Merton Border, MD  HPI: Angela Page is a 47 y.o. female seen for follow up of Acute on Chronic Respiratory Failure.  Patient is on T collar right now comfortable without distress remains on 28% FiO2  Medications: Reviewed on Rounds  Physical Exam:  Vitals: Temperature 97.4 pulse 96 respiratory 14 blood pressure 104/67 saturations 96%  Ventilator Settings currently on T collar FiO2 28%  . General: Comfortable at this time . Eyes: Grossly normal lids, irises & conjunctiva . ENT: grossly tongue is normal . Neck: no obvious mass . Cardiovascular: S1 S2 normal no gallop . Respiratory: No rhonchi or rales are noted at this time . Abdomen: soft . Skin: no rash seen on limited exam . Musculoskeletal: not rigid . Psychiatric:unable to assess . Neurologic: no seizure no involuntary movements         Lab Data:   Basic Metabolic Panel: Recent Labs  Lab 01/11/18 0656  NA 136  K 3.6  CL 88*  CO2 33*  GLUCOSE 183*  BUN 29*  CREATININE 0.87  CALCIUM 10.6*  MG 2.5*  PHOS 5.1*    ABG: No results for input(s): PHART, PCO2ART, PO2ART, HCO3, O2SAT in the last 168 hours.  Liver Function Tests: No results for input(s): AST, ALT, ALKPHOS, BILITOT, PROT, ALBUMIN in the last 168 hours. No results for input(s): LIPASE, AMYLASE in the last 168 hours. No results for input(s): AMMONIA in the last 168 hours.  CBC: Recent Labs  Lab 01/11/18 0656  WBC 11.7*  HGB 9.3*  HCT 30.7*  MCV 94.5  PLT 430*    Cardiac Enzymes: No results for input(s): CKTOTAL, CKMB, CKMBINDEX, TROPONINI in the last 168 hours.  BNP (last 3 results) No results for input(s): BNP in the last 8760 hours.  ProBNP (last 3 results) No results for input(s): PROBNP in the  last 8760 hours.  Radiological Exams: Dg Chest Port 1 View  Result Date: 01/13/2018 CLINICAL DATA:  Fever. Pneumonia. Acute on chronic respiratory failure with hypoxia. EXAM: PORTABLE CHEST 1 VIEW COMPARISON:  01/01/2018 FINDINGS: Tracheostomy tube and pacemaker remain in place. Stable mild cardiomegaly. Pulmonary hyperinflation again seen, consistent with COPD. Bibasilar scarring is stable. No evidence of acute or superimposed pulmonary infiltrate. No evidence of pleural effusion or pneumothorax. Cervical spine fusion hardware also noted. IMPRESSION: Stable mild cardiomegaly, COPD, and bibasilar scarring. No acute cardiopulmonary abnormality. Electronically Signed   By: Earle Gell M.D.   On: 01/13/2018 17:29    Assessment/Plan Active Problems:   Acute on chronic respiratory failure with hypoxia (HCC)   Quadriplegia and quadriparesis (HCC)   Lobar pneumonia (HCC)   At high risk for severe sepsis   Tracheostomy status (HCC)   Decubitus ulcer of buttock, unstageable ([place])   1. Acute on chronic respiratory failure with hypoxia we will continue with T collar trials titrate oxygen continue pulmonary toilet as tolerated. 2. Quadriparesis at baseline we will continue with supportive care 3. Lobar pneumonia treated basilar scarring noted 4. High risk for sepsis we will continue to monitor 5. Tracheostomy remains in place 6. Decubitus ulcers followed by wound care   I have personally seen and evaluated the patient, evaluated laboratory and imaging results, formulated the assessment and plan and placed orders. The Patient requires high complexity decision making for assessment and  support.  Case was discussed on Rounds with the Respiratory Therapy Staff  Allyne Gee, MD [facility] Pulmonary Critical Care Medicine Sleep Medicine

## 2018-01-15 LAB — CBC
HCT: 29.1 % — ABNORMAL LOW (ref 36.0–46.0)
Hemoglobin: 8.8 g/dL — ABNORMAL LOW (ref 12.0–15.0)
MCH: 28.7 pg (ref 26.0–34.0)
MCHC: 30.2 g/dL (ref 30.0–36.0)
MCV: 94.8 fL (ref 80.0–100.0)
PLATELETS: 471 10*3/uL — AB (ref 150–400)
RBC: 3.07 MIL/uL — ABNORMAL LOW (ref 3.87–5.11)
RDW: 17.6 % — ABNORMAL HIGH (ref 11.5–15.5)
WBC: 15.7 10*3/uL — ABNORMAL HIGH (ref 4.0–10.5)
nRBC: 0 % (ref 0.0–0.2)

## 2018-01-15 LAB — BASIC METABOLIC PANEL
Anion gap: 16 — ABNORMAL HIGH (ref 5–15)
BUN: 36 mg/dL — ABNORMAL HIGH (ref 6–20)
CALCIUM: 10.6 mg/dL — AB (ref 8.9–10.3)
CO2: 33 mmol/L — ABNORMAL HIGH (ref 22–32)
CREATININE: 0.84 mg/dL (ref 0.44–1.00)
Chloride: 87 mmol/L — ABNORMAL LOW (ref 98–111)
GFR calc Af Amer: >60 mL/min (ref >60)
Glucose, Bld: 236 mg/dL — ABNORMAL HIGH (ref 70–99)
Potassium: 3.7 mmol/L (ref 3.5–5.1)
Sodium: 136 mmol/L (ref 135–145)

## 2018-01-15 LAB — MAGNESIUM: Magnesium: 2.4 mg/dL (ref 1.7–2.4)

## 2018-01-15 LAB — PHOSPHORUS: Phosphorus: 4.7 mg/dL — ABNORMAL HIGH (ref 2.5–4.6)

## 2018-01-15 NOTE — Progress Notes (Signed)
Pulmonary Critical Care Medicine [place]   PULMONARY CRITICAL CARE SERVICE  PROGRESS NOTE  Date of Service: 01/15/2018  Angela Page  FBP:[id number]  DOB: [birth year]   DOA: 12/31/2017  Referring Physician: Merton Border, MD  HPI: Angela Page is a 47 y.o. female seen for follow up of Acute on Chronic Respiratory Failure.  Currently is on T collar without distress.  Patient's been on 28% oxygen good saturations are noted  Medications: Reviewed on Rounds  Physical Exam:  Vitals: Temperature 98.0 pulse 99 respiratory rate 15 blood pressure 100/64 saturations 93%  Ventilator Settings off the ventilator right now on T collar  . General: Comfortable at this time . Eyes: Grossly normal lids, irises & conjunctiva . ENT: grossly tongue is normal . Neck: no obvious mass . Cardiovascular: S1 S2 normal no gallop . Respiratory: Scattered rhonchi noted bilaterally . Abdomen: soft . Skin: no rash seen on limited exam . Musculoskeletal: not rigid . Psychiatric:unable to assess . Neurologic: no seizure no involuntary movements         Lab Data:   Basic Metabolic Panel: Recent Labs  Lab 01/11/18 0656 01/15/18 0651  NA 136 136  K 3.6 3.7  CL 88* 87*  CO2 33* 33*  GLUCOSE 183* 236*  BUN 29* 36*  CREATININE 0.87 0.84  CALCIUM 10.6* 10.6*  MG 2.5* 2.4  PHOS 5.1* 4.7*    ABG: No results for input(s): PHART, PCO2ART, PO2ART, HCO3, O2SAT in the last 168 hours.  Liver Function Tests: No results for input(s): AST, ALT, ALKPHOS, BILITOT, PROT, ALBUMIN in the last 168 hours. No results for input(s): LIPASE, AMYLASE in the last 168 hours. No results for input(s): AMMONIA in the last 168 hours.  CBC: Recent Labs  Lab 01/11/18 0656 01/15/18 0651  WBC 11.7* 15.7*  HGB 9.3* 8.8*  HCT 30.7* 29.1*  MCV 94.5 94.8  PLT 430* 471*    Cardiac Enzymes: No results for input(s): CKTOTAL, CKMB, CKMBINDEX, TROPONINI in the last 168 hours.  BNP (last 3  results) No results for input(s): BNP in the last 8760 hours.  ProBNP (last 3 results) No results for input(s): PROBNP in the last 8760 hours.  Radiological Exams: Dg Chest Port 1 View  Result Date: 01/13/2018 CLINICAL DATA:  Fever. Pneumonia. Acute on chronic respiratory failure with hypoxia. EXAM: PORTABLE CHEST 1 VIEW COMPARISON:  01/01/2018 FINDINGS: Tracheostomy tube and pacemaker remain in place. Stable mild cardiomegaly. Pulmonary hyperinflation again seen, consistent with COPD. Bibasilar scarring is stable. No evidence of acute or superimposed pulmonary infiltrate. No evidence of pleural effusion or pneumothorax. Cervical spine fusion hardware also noted. IMPRESSION: Stable mild cardiomegaly, COPD, and bibasilar scarring. No acute cardiopulmonary abnormality. Electronically Signed   By: Earle Gell M.D.   On: 01/13/2018 17:29    Assessment/Plan Active Problems:   Acute on chronic respiratory failure with hypoxia (HCC)   Quadriplegia and quadriparesis (HCC)   Lobar pneumonia (HCC)   At high risk for severe sepsis   Tracheostomy status (HCC)   Decubitus ulcer of buttock, unstageable ([place])   1. Acute on chronic respiratory failure with hypoxia we will continue with T collar trials continue secretion management pulmonary toilet.  Patient's overall prognosis is guarded. 2. Quadriplegia we will continue with supportive care therapy as tolerated 3. Lobar pneumonia treated 4. High risk severe sepsis continue present management 5. Tracheostomy status will continue present therapy patient is at baseline   I have personally seen and evaluated the patient, evaluated laboratory and imaging  results, formulated the assessment and plan and placed orders. The Patient requires high complexity decision making for assessment and support.  Case was discussed on Rounds with the Respiratory Therapy Staff  Allyne Gee, MD [facility] Pulmonary Critical Care Medicine Sleep Medicine

## 2018-01-16 LAB — CULTURE, RESPIRATORY W GRAM STAIN

## 2018-01-16 LAB — URINE CULTURE: Culture: >=100000 — AB

## 2018-01-16 NOTE — Progress Notes (Signed)
Pulmonary Critical Care Medicine [place]   PULMONARY CRITICAL CARE SERVICE  PROGRESS NOTE  Date of Service: 01/16/2018  Angela Page  ERD:[id number]  DOB: April 12, 1970   DOA: 12/31/2017  Referring Physician: Merton Border, MD  HPI: Angela Page is a 47 y.o. female seen for follow up of Acute on Chronic Respiratory Failure.  Patient is on T collar right now comfortable without distress  Medications: Reviewed on Rounds  Physical Exam:  Vitals: Temperature 98.0 pulse 88 respiratory 18 blood pressure 99/53 saturation 97%  Ventilator Settings off the ventilator on T collar  . General: Comfortable at this time . Eyes: Grossly normal lids, irises & conjunctiva . ENT: grossly tongue is normal . Neck: no obvious mass . Cardiovascular: S1 S2 normal no gallop . Respiratory: No rhonchi or rales are noted . Abdomen: soft . Skin: no rash seen on limited exam . Musculoskeletal: not rigid . Psychiatric:unable to assess . Neurologic: no seizure no involuntary movements         Lab Data:   Basic Metabolic Panel: Recent Labs  Lab 01/11/18 0656 01/15/18 0651  NA 136 136  K 3.6 3.7  CL 88* 87*  CO2 33* 33*  GLUCOSE 183* 236*  BUN 29* 36*  CREATININE 0.87 0.84  CALCIUM 10.6* 10.6*  MG 2.5* 2.4  PHOS 5.1* 4.7*    ABG: No results for input(s): PHART, PCO2ART, PO2ART, HCO3, O2SAT in the last 168 hours.  Liver Function Tests: No results for input(s): AST, ALT, ALKPHOS, BILITOT, PROT, ALBUMIN in the last 168 hours. No results for input(s): LIPASE, AMYLASE in the last 168 hours. No results for input(s): AMMONIA in the last 168 hours.  CBC: Recent Labs  Lab 01/11/18 0656 01/15/18 0651  WBC 11.7* 15.7*  HGB 9.3* 8.8*  HCT 30.7* 29.1*  MCV 94.5 94.8  PLT 430* 471*    Cardiac Enzymes: No results for input(s): CKTOTAL, CKMB, CKMBINDEX, TROPONINI in the last 168 hours.  BNP (last 3 results) No results for input(s): BNP in the last 8760  hours.  ProBNP (last 3 results) No results for input(s): PROBNP in the last 8760 hours.  Radiological Exams: No results found.  Assessment/Plan Active Problems:   Acute on chronic respiratory failure with hypoxia (HCC)   Quadriplegia and quadriparesis (HCC)   Lobar pneumonia (HCC)   At high risk for severe sepsis   Tracheostomy status (HCC)   Decubitus ulcer of buttock, unstageable ([place])   1. Acute on chronic respiratory failure with hypoxia continue with weaning on T collar trials patient is at baseline 2. Quadriplegia therapy as tolerated 3. Lobar pneumonia treated monitor 4. High risk for sepsis stable 5. Tracheostomy she is back at baseline we will continue to follow 6. Decubitus treated by wound care   I have personally seen and evaluated the patient, evaluated laboratory and imaging results, formulated the assessment and plan and placed orders. The Patient requires high complexity decision making for assessment and support.  Case was discussed on Rounds with the Respiratory Therapy Staff  Allyne Gee, MD [facility] Pulmonary Critical Care Medicine Sleep Medicine

## 2018-01-17 LAB — VANCOMYCIN, TROUGH: Vancomycin Tr: 30 ug/mL (ref 15–20)

## 2018-01-17 NOTE — Progress Notes (Signed)
Pulmonary Critical Care Medicine [place]   PULMONARY CRITICAL CARE SERVICE  PROGRESS NOTE  Date of Service: 01/17/2018  Angela Page  OTL:[id number]  DOB: 1971/01/08   DOA: 12/31/2017  Referring Physician: Merton Border, MD  HPI: Angela Page is a 47 y.o. female seen for follow up of Acute on Chronic Respiratory Failure.  Right now is resting comfortably on 35% FiO2 secretions are copious however  Medications: Reviewed on Rounds  Physical Exam:  Vitals: Temperature 97.0 pulse 84 respiratory rate 18 blood pressure 115/65 saturations 98%  Ventilator Settings off the ventilator on T collar right now  . General: Comfortable at this time . Eyes: Grossly normal lids, irises & conjunctiva . ENT: grossly tongue is normal . Neck: no obvious mass . Cardiovascular: S1 S2 normal no gallop . Respiratory: No rhonchi no rales are noted . Abdomen: soft . Skin: no rash seen on limited exam . Musculoskeletal: not rigid . Psychiatric:unable to assess . Neurologic: no seizure no involuntary movements         Lab Data:   Basic Metabolic Panel: Recent Labs  Lab 01/11/18 0656 01/15/18 0651  NA 136 136  K 3.6 3.7  CL 88* 87*  CO2 33* 33*  GLUCOSE 183* 236*  BUN 29* 36*  CREATININE 0.87 0.84  CALCIUM 10.6* 10.6*  MG 2.5* 2.4  PHOS 5.1* 4.7*    ABG: No results for input(s): PHART, PCO2ART, PO2ART, HCO3, O2SAT in the last 168 hours.  Liver Function Tests: No results for input(s): AST, ALT, ALKPHOS, BILITOT, PROT, ALBUMIN in the last 168 hours. No results for input(s): LIPASE, AMYLASE in the last 168 hours. No results for input(s): AMMONIA in the last 168 hours.  CBC: Recent Labs  Lab 01/11/18 0656 01/15/18 0651  WBC 11.7* 15.7*  HGB 9.3* 8.8*  HCT 30.7* 29.1*  MCV 94.5 94.8  PLT 430* 471*    Cardiac Enzymes: No results for input(s): CKTOTAL, CKMB, CKMBINDEX, TROPONINI in the last 168 hours.  BNP (last 3 results) No results for input(s):  BNP in the last 8760 hours.  ProBNP (last 3 results) No results for input(s): PROBNP in the last 8760 hours.  Radiological Exams: No results found.  Assessment/Plan Active Problems:   Acute on chronic respiratory failure with hypoxia (HCC)   Quadriplegia and quadriparesis (HCC)   Lobar pneumonia (HCC)   At high risk for severe sepsis   Tracheostomy status (HCC)   Decubitus ulcer of buttock, unstageable ([place])   1. Acute on chronic respiratory failure with hypoxia we will continue with T collar weans continue secretion management pulmonary toilet. 2. Lobar pneumonia treated improving 3. High risk severe sepsis stable 4. Tracheostomy remains in place 5. Decubitus ulcers followed by wound care   I have personally seen and evaluated the patient, evaluated laboratory and imaging results, formulated the assessment and plan and placed orders. The Patient requires high complexity decision making for assessment and support.  Case was discussed on Rounds with the Respiratory Therapy Staff  Allyne Gee, MD [facility] Pulmonary Critical Care Medicine Sleep Medicine

## 2018-01-18 LAB — CULTURE, BLOOD (ROUTINE X 2)
Culture: NO GROWTH
Culture: NO GROWTH
SPECIAL REQUESTS: ADEQUATE
Special Requests: ADEQUATE

## 2018-01-18 NOTE — Progress Notes (Signed)
Pulmonary Critical Care Medicine [place]   PULMONARY CRITICAL CARE SERVICE  PROGRESS NOTE  Date of Service: 01/18/2018  Angela Page  ION:[id number]  DOB: Sep 01, 1970   DOA: 12/31/2017  Referring Physician: Merton Border, MD  HPI: Angela Page is a 47 y.o. female seen for follow up of Acute on Chronic Respiratory Failure.  Patient is currently on T collar has been on 28% oxygen good saturations are noted  Medications: Reviewed on Rounds  Physical Exam:  Vitals: Temperature 96.5 pulse 82 respiratory rate 16 blood pressure 108/64 saturations 95%  Ventilator Settings off the ventilator on T collar right   . General: Comfortable at this time . Eyes: Grossly normal lids, irises & conjunctiva . ENT: grossly tongue is normal . Neck: no obvious mass . Cardiovascular: S1 S2 normal no gallop . Respiratory: No rhonchi or rales are noted . Abdomen: soft . Skin: no rash seen on limited exam . Musculoskeletal: not rigid . Psychiatric:unable to assess . Neurologic: no seizure no involuntary movements         Lab Data:   Basic Metabolic Panel: Recent Labs  Lab 01/15/18 0651  NA 136  K 3.7  CL 87*  CO2 33*  GLUCOSE 236*  BUN 36*  CREATININE 0.84  CALCIUM 10.6*  MG 2.4  PHOS 4.7*    ABG: No results for input(s): PHART, PCO2ART, PO2ART, HCO3, O2SAT in the last 168 hours.  Liver Function Tests: No results for input(s): AST, ALT, ALKPHOS, BILITOT, PROT, ALBUMIN in the last 168 hours. No results for input(s): LIPASE, AMYLASE in the last 168 hours. No results for input(s): AMMONIA in the last 168 hours.  CBC: Recent Labs  Lab 01/15/18 0651  WBC 15.7*  HGB 8.8*  HCT 29.1*  MCV 94.8  PLT 471*    Cardiac Enzymes: No results for input(s): CKTOTAL, CKMB, CKMBINDEX, TROPONINI in the last 168 hours.  BNP (last 3 results) No results for input(s): BNP in the last 8760 hours.  ProBNP (last 3 results) No results for input(s): PROBNP in the  last 8760 hours.  Radiological Exams: No results found.  Assessment/Plan Active Problems:   Acute on chronic respiratory failure with hypoxia (HCC)   Quadriplegia and quadriparesis (HCC)   Lobar pneumonia (HCC)   At high risk for severe sepsis   Tracheostomy status (HCC)   Decubitus ulcer of buttock, unstageable ([place])   1. Acute on chronic respiratory failure with hypoxia patient is tolerating T collar at baseline 2. Quadriplegia supportive care 3. Lobar pneumonia treated we will continue with present management 4. High risk for sepsis stable 5. Tracheostomy remains in place 6. Decubitus ulcers were continue with wound care   I have personally seen and evaluated the patient, evaluated laboratory and imaging results, formulated the assessment and plan and placed orders. The Patient requires high complexity decision making for assessment and support.  Case was discussed on Rounds with the Respiratory Therapy Staff  Allyne Gee, MD [facility] Pulmonary Critical Care Medicine Sleep Medicine

## 2018-01-19 LAB — VANCOMYCIN, TROUGH: Vancomycin Tr: 9 ug/mL — ABNORMAL LOW (ref 15–20)

## 2018-01-19 NOTE — Progress Notes (Signed)
Pulmonary Critical Care Medicine [place]   PULMONARY CRITICAL CARE SERVICE  PROGRESS NOTE  Date of Service: 01/19/2018  Angela Page  HUD:[id number]  DOB: 02-19-1970   DOA: 12/31/2017  Referring Physician: Merton Border, MD  HPI: Angela Page is a 47 y.o. female seen for follow up of Acute on Chronic Respiratory Failure.  She is on T collar currently on 30% FiO2 good saturations are noted.  She has been tolerating it fairly well  Medications: Reviewed on Rounds  Physical Exam:  Vitals: Temperature 97.5 pulse 80 respiratory 20 blood pressure 103/37 saturation 97%  Ventilator Settings off ventilator on T collar right now  . General: Comfortable at this time . Eyes: Grossly normal lids, irises & conjunctiva . ENT: grossly tongue is normal . Neck: no obvious mass . Cardiovascular: S1 S2 normal no gallop . Respiratory: No rhonchi or rales are noted . Abdomen: soft . Skin: no rash seen on limited exam . Musculoskeletal: not rigid . Psychiatric:unable to assess . Neurologic: no seizure no involuntary movements         Lab Data:   Basic Metabolic Panel: Recent Labs  Lab 01/15/18 0651  NA 136  K 3.7  CL 87*  CO2 33*  GLUCOSE 236*  BUN 36*  CREATININE 0.84  CALCIUM 10.6*  MG 2.4  PHOS 4.7*    ABG: No results for input(s): PHART, PCO2ART, PO2ART, HCO3, O2SAT in the last 168 hours.  Liver Function Tests: No results for input(s): AST, ALT, ALKPHOS, BILITOT, PROT, ALBUMIN in the last 168 hours. No results for input(s): LIPASE, AMYLASE in the last 168 hours. No results for input(s): AMMONIA in the last 168 hours.  CBC: Recent Labs  Lab 01/15/18 0651  WBC 15.7*  HGB 8.8*  HCT 29.1*  MCV 94.8  PLT 471*    Cardiac Enzymes: No results for input(s): CKTOTAL, CKMB, CKMBINDEX, TROPONINI in the last 168 hours.  BNP (last 3 results) No results for input(s): BNP in the last 8760 hours.  ProBNP (last 3 results) No results for input(s):  PROBNP in the last 8760 hours.  Radiological Exams: No results found.  Assessment/Plan Active Problems:   Acute on chronic respiratory failure with hypoxia (HCC)   Quadriplegia and quadriparesis (HCC)   Lobar pneumonia (HCC)   At high risk for severe sepsis   Tracheostomy status (HCC)   Decubitus ulcer of buttock, unstageable ([place])   1. Acute on chronic respiratory failure with hypoxia we will continue with T collar continue pulmonary toilet secretion management. 2. Quadriplegia grossly unchanged we will monitor 3. Lobar pneumonia treated 4. High risk for sepsis stable 5. Tracheostomy remains in place 6. Decubitus being seen by wound care   I have personally seen and evaluated the patient, evaluated laboratory and imaging results, formulated the assessment and plan and placed orders. The Patient requires high complexity decision making for assessment and support.  Case was discussed on Rounds with the Respiratory Therapy Staff  Allyne Gee, MD [facility] Pulmonary Critical Care Medicine Sleep Medicine

## 2018-01-20 LAB — BASIC METABOLIC PANEL
Anion gap: 10 (ref 5–15)
BUN: 28 mg/dL — ABNORMAL HIGH (ref 6–20)
CHLORIDE: 93 mmol/L — AB (ref 98–111)
CO2: 33 mmol/L — ABNORMAL HIGH (ref 22–32)
Calcium: 10.3 mg/dL (ref 8.9–10.3)
Creatinine, Ser: 0.67 mg/dL (ref 0.44–1.00)
GFR calc Af Amer: >60 mL/min (ref >60)
GFR calc non Af Amer: >60 mL/min (ref >60)
Glucose, Bld: 135 mg/dL — ABNORMAL HIGH (ref 70–99)
Potassium: 4.1 mmol/L (ref 3.5–5.1)
Sodium: 136 mmol/L (ref 135–145)

## 2018-01-20 LAB — CBC
HCT: 28.6 % — ABNORMAL LOW (ref 36.0–46.0)
Hemoglobin: 8.8 g/dL — ABNORMAL LOW (ref 12.0–15.0)
MCH: 29.3 pg (ref 26.0–34.0)
MCHC: 30.8 g/dL (ref 30.0–36.0)
MCV: 95.3 fL (ref 80.0–100.0)
Platelets: 701 10*3/uL — ABNORMAL HIGH (ref 150–400)
RBC: 3 MIL/uL — ABNORMAL LOW (ref 3.87–5.11)
RDW: 17.7 % — ABNORMAL HIGH (ref 11.5–15.5)
WBC: 14.3 10*3/uL — ABNORMAL HIGH (ref 4.0–10.5)
nRBC: 0.3 % — ABNORMAL HIGH (ref 0.0–0.2)

## 2018-01-20 LAB — MAGNESIUM: Magnesium: 2.5 mg/dL — ABNORMAL HIGH (ref 1.7–2.4)

## 2018-01-20 NOTE — Progress Notes (Addendum)
Pulmonary Critical Care Medicine [place]   PULMONARY CRITICAL CARE SERVICE  PROGRESS NOTE  Date of Service: 01/20/2018  Angela Page  [id number]  DOB: 12-22-1970   DOA: 12/31/2017  Referring Physician: Merton Border, MD  HPI: Angela Page is a 48 y.o. female seen for follow up of Acute on Chronic Respiratory Failure.  Patient is doing well on trach collar 28%.  She does continue to have thick secretions and therefore only tolerates her PMV for 10 to 15 minutes.  Medications: Reviewed on Rounds  Physical Exam:  Vitals: Pulse 80 respirations 16 blood pressure 99/54 O2 sat 98% temp 97.6  Ventilator Settings patient is not currently on ventilator  . General: Comfortable at this time . Eyes: Grossly normal lids, irises & conjunctiva . ENT: grossly tongue is normal . Neck: no obvious mass . Cardiovascular: S1 S2 normal no gallop . Respiratory: Coarse breath sounds . Abdomen: soft . Skin: no rash seen on limited exam . Musculoskeletal: not rigid . Psychiatric:unable to assess . Neurologic: no seizure no involuntary movements         Lab Data:   Basic Metabolic Panel: Recent Labs  Lab 01/15/18 0651 01/20/18 0636  NA 136 136  K 3.7 4.1  CL 87* 93*  CO2 33* 33*  GLUCOSE 236* 135*  BUN 36* 28*  CREATININE 0.84 0.67  CALCIUM 10.6* 10.3  MG 2.4 2.5*  PHOS 4.7*  --     ABG: No results for input(s): PHART, PCO2ART, PO2ART, HCO3, O2SAT in the last 168 hours.  Liver Function Tests: No results for input(s): AST, ALT, ALKPHOS, BILITOT, PROT, ALBUMIN in the last 168 hours. No results for input(s): LIPASE, AMYLASE in the last 168 hours. No results for input(s): AMMONIA in the last 168 hours.  CBC: Recent Labs  Lab 01/15/18 0651 01/20/18 0636  WBC 15.7* 14.3*  HGB 8.8* 8.8*  HCT 29.1* 28.6*  MCV 94.8 95.3  PLT 471* 701*    Cardiac Enzymes: No results for input(s): CKTOTAL, CKMB, CKMBINDEX, TROPONINI in the last 168 hours.  BNP  (last 3 results) No results for input(s): BNP in the last 8760 hours.  ProBNP (last 3 results) No results for input(s): PROBNP in the last 8760 hours.  Radiological Exams: No results found.  Assessment/Plan Active Problems:   Acute on chronic respiratory failure with hypoxia (HCC)   Quadriplegia and quadriparesis (HCC)   Lobar pneumonia (HCC)   At high risk for severe sepsis   Tracheostomy status (HCC)   Decubitus ulcer of buttock, unstageable ([place])   1. Acute on chronic respiratory failure with hypoxia continue trach collar per weaning protocol.  Also continue pulmonary toilet and secretion management 2. Quadriplegia grossly unchanged continue to monitor 3. Lobar pneumonia treated 4. High risk for sepsis stable 5. Tracheostomy remains in place 6. Decubitus being seen by wound care   I have personally seen and evaluated the patient, evaluated laboratory and imaging results, formulated the assessment and plan and placed orders. The Patient requires high complexity decision making for assessment and support.  Case was discussed on Rounds with the Respiratory Therapy Staff  Allyne Gee, MD [facility] Pulmonary Critical Care Medicine Sleep Medicine

## 2018-01-21 NOTE — Progress Notes (Signed)
Pulmonary Critical Care Medicine [place]   PULMONARY CRITICAL CARE SERVICE  PROGRESS NOTE  Date of Service: 01/21/2018  Kaelie Henigan  GGE:[id number]  DOB: 1970/11/23   DOA: 12/31/2017  Referring Physician: Merton Border, MD  HPI: Brynne Doane is a 48 y.o. female seen for follow up of Acute on Chronic Respiratory Failure.  Patient is on T collar right now on 28% FiO2 with PMV  Medications: Reviewed on Rounds  Physical Exam:  Vitals: Temperature 96.1 pulse 80 respiratory 17 blood pressure 100/59 saturations 99%  Ventilator Settings currently is on T collar FiO2 28% with PMV  . General: Comfortable at this time . Eyes: Grossly normal lids, irises & conjunctiva . ENT: grossly tongue is normal . Neck: no obvious mass . Cardiovascular: S1 S2 normal no gallop . Respiratory: No rhonchi are noted at this time . Abdomen: soft . Skin: no rash seen on limited exam . Musculoskeletal: not rigid . Psychiatric:unable to assess . Neurologic: no seizure no involuntary movements         Lab Data:   Basic Metabolic Panel: Recent Labs  Lab 01/15/18 0651 01/20/18 0636  NA 136 136  K 3.7 4.1  CL 87* 93*  CO2 33* 33*  GLUCOSE 236* 135*  BUN 36* 28*  CREATININE 0.84 0.67  CALCIUM 10.6* 10.3  MG 2.4 2.5*  PHOS 4.7*  --     ABG: No results for input(s): PHART, PCO2ART, PO2ART, HCO3, O2SAT in the last 168 hours.  Liver Function Tests: No results for input(s): AST, ALT, ALKPHOS, BILITOT, PROT, ALBUMIN in the last 168 hours. No results for input(s): LIPASE, AMYLASE in the last 168 hours. No results for input(s): AMMONIA in the last 168 hours.  CBC: Recent Labs  Lab 01/15/18 0651 01/20/18 0636  WBC 15.7* 14.3*  HGB 8.8* 8.8*  HCT 29.1* 28.6*  MCV 94.8 95.3  PLT 471* 701*    Cardiac Enzymes: No results for input(s): CKTOTAL, CKMB, CKMBINDEX, TROPONINI in the last 168 hours.  BNP (last 3 results) No results for input(s): BNP in the last 8760  hours.  ProBNP (last 3 results) No results for input(s): PROBNP in the last 8760 hours.  Radiological Exams: No results found.  Assessment/Plan Active Problems:   Acute on chronic respiratory failure with hypoxia (HCC)   Quadriplegia and quadriparesis (HCC)   Lobar pneumonia (HCC)   At high risk for severe sepsis   Tracheostomy status (HCC)   Decubitus ulcer of buttock, unstageable ([place])   1. Acute on chronic respiratory failure with hypoxia we will continue with T collar patient is at baseline with PMV 2. Lobar pneumonia treated we will continue to monitor 3. Sepsis no active sepsis 4. Tracheostomy will continue pulmonary toilet 5. Decubitus treated we will continue to monitor   I have personally seen and evaluated the patient, evaluated laboratory and imaging results, formulated the assessment and plan and placed orders. The Patient requires high complexity decision making for assessment and support.  Case was discussed on Rounds with the Respiratory Therapy Staff  Allyne Gee, MD [facility] Pulmonary Critical Care Medicine Sleep Medicine

## 2018-01-22 ENCOUNTER — Other Ambulatory Visit (HOSPITAL_COMMUNITY): Payer: Medicare Other

## 2018-01-22 NOTE — Progress Notes (Signed)
Pulmonary Critical Care Medicine [place]   PULMONARY CRITICAL CARE SERVICE  PROGRESS NOTE  Date of Service: 01/22/2018  [name]  [id number]:[id number]  DOB: 18-Sep-1970   DOA: 12/31/2017  Referring Physician: Merton Border, MD  HPI: Angela Page is a 48 y.o. female seen for follow up of Acute on Chronic Respiratory Failure.  Patient is comfortable right now without distress is on T collar has been on 28% FiO2  Medications: Reviewed on Rounds  Physical Exam:  Vitals: Temperature 98.2 pulse 82 respiratory 15 blood pressure 98/60 saturations 97%  Ventilator Settings off the ventilator on T collar  . General: Comfortable at this time . Eyes: Grossly normal lids, irises & conjunctiva . ENT: grossly tongue is normal . Neck: no obvious mass . Cardiovascular: S1 S2 normal no gallop . Respiratory: Scattered rhonchi expansion is equal . Abdomen: soft . Skin: no rash seen on limited exam . Musculoskeletal: not rigid . Psychiatric:unable to assess . Neurologic: no seizure no involuntary movements         Lab Data:   Basic Metabolic Panel: Recent Labs  Lab 01/20/18 0636  NA 136  K 4.1  CL 93*  CO2 33*  GLUCOSE 135*  BUN 28*  CREATININE 0.67  CALCIUM 10.3  MG 2.5*    ABG: No results for input(s): PHART, PCO2ART, PO2ART, HCO3, O2SAT in the last 168 hours.  Liver Function Tests: No results for input(s): AST, ALT, ALKPHOS, BILITOT, PROT, ALBUMIN in the last 168 hours. No results for input(s): LIPASE, AMYLASE in the last 168 hours. No results for input(s): AMMONIA in the last 168 hours.  CBC: Recent Labs  Lab 01/20/18 0636  WBC 14.3*  HGB 8.8*  HCT 28.6*  MCV 95.3  PLT 701*    Cardiac Enzymes: No results for input(s): CKTOTAL, CKMB, CKMBINDEX, TROPONINI in the last 168 hours.  BNP (last 3 results) No results for input(s): BNP in the last 8760 hours.  ProBNP (last 3 results) No results for input(s): PROBNP in the last 8760  hours.  Radiological Exams: No results found.  Assessment/Plan Active Problems:   Acute on chronic respiratory failure with hypoxia (HCC)   Quadriplegia and quadriparesis (HCC)   Lobar pneumonia (HCC)   At high risk for severe sepsis   Tracheostomy status (HCC)   Decubitus ulcer of buttock, unstageable ([place])   1. Acute on chronic respiratory failure with hypoxia we will continue with T collar wean as tolerated.  Continue pulmonary toilet 2. Quadriplegia at baseline 3. Lobar pneumonia treated clinically improving 4. Risk for sepsis stable at this time 5. Tracheostomy status on T collar 6. Decubitus ulcer wound care is following   I have personally seen and evaluated the patient, evaluated laboratory and imaging results, formulated the assessment and plan and placed orders. The Patient requires high complexity decision making for assessment and support.  Case was discussed on Rounds with the Respiratory Therapy Staff  Allyne Gee, MD [facility] Pulmonary Critical Care Medicine Sleep Medicine

## 2018-01-23 ENCOUNTER — Other Ambulatory Visit (HOSPITAL_COMMUNITY): Payer: Medicare Other

## 2018-01-23 LAB — VANCOMYCIN, TROUGH: Vancomycin Tr: 36 ug/mL (ref 15–20)

## 2018-01-23 NOTE — Progress Notes (Signed)
Pulmonary Critical Care Medicine [place]   PULMONARY CRITICAL CARE SERVICE  PROGRESS NOTE  Date of Service: 01/23/2018  Angela Page  [id number]:[id number]  DOB: 07-07-1970   DOA: 12/31/2017  Referring Physician: Merton Border, MD  HPI: Angela Page is a 48 y.o. female seen for follow up of Acute on Chronic Respiratory Failure.  She is on T collar.  She possibly aspirated has a low-grade fever noted at this time.  Medications: Reviewed on Rounds  Physical Exam:  Vitals: Temperature 99.5 pulse 93 respiratory 16 blood pressure 100/56 saturations 97%  Ventilator Settings off the ventilator on T collar right now  . General: Comfortable at this time . Eyes: Grossly normal lids, irises & conjunctiva . ENT: grossly tongue is normal . Neck: no obvious mass . Cardiovascular: S1 S2 normal no gallop . Respiratory: Coarse breath sounds with a few distant rhonchi . Abdomen: soft . Skin: no rash seen on limited exam . Musculoskeletal: not rigid . Psychiatric:unable to assess . Neurologic: no seizure no involuntary movements         Lab Data:   Basic Metabolic Panel: Recent Labs  Lab 01/20/18 0636  NA 136  K 4.1  CL 93*  CO2 33*  GLUCOSE 135*  BUN 28*  CREATININE 0.67  CALCIUM 10.3  MG 2.5*    ABG: No results for input(s): PHART, PCO2ART, PO2ART, HCO3, O2SAT in the last 168 hours.  Liver Function Tests: No results for input(s): AST, ALT, ALKPHOS, BILITOT, PROT, ALBUMIN in the last 168 hours. No results for input(s): LIPASE, AMYLASE in the last 168 hours. No results for input(s): AMMONIA in the last 168 hours.  CBC: Recent Labs  Lab 01/20/18 0636  WBC 14.3*  HGB 8.8*  HCT 28.6*  MCV 95.3  PLT 701*    Cardiac Enzymes: No results for input(s): CKTOTAL, CKMB, CKMBINDEX, TROPONINI in the last 168 hours.  BNP (last 3 results) No results for input(s): BNP in the last 8760 hours.  ProBNP (last 3 results) No results for input(s): PROBNP in the  last 8760 hours.  Radiological Exams: Dg Chest Port 1 View  Result Date: 01/23/2018 CLINICAL DATA:  48 year old female with fever and chronic ventilator dependence EXAM: PORTABLE CHEST 1 VIEW COMPARISON:  Prior chest x-ray 01/13/2018 FINDINGS: Tracheostomy tube remains stable in position. The tip is midline and at the level of the clavicles. Stable position of left subclavian approach cardiac rhythm maintenance device with leads projecting over the right atrium and ventricle. Unchanged cardiomegaly. Similar appearance of patchy bibasilar and left mid lung airspace opacities. Background bronchitic changes are also similar. No definite new acute airspace disease. No pneumothorax. No acute osseous abnormality. IMPRESSION: 1. Stable support apparatus. 2. Similar appearance of patchy airspace opacities in both lung bases and the left mid lung compared to 01/13/2018. Differential considerations include persistent multilobar pneumonia versus chronic atelectasis/scarring. 3. Electronically Signed   By: Jacqulynn Cadet M.D.   On: 01/23/2018 11:49    Assessment/Plan Active Problems:   Acute on chronic respiratory failure with hypoxia (HCC)   Quadriplegia and quadriparesis (HCC)   Lobar pneumonia (HCC)   At high risk for severe sepsis   Tracheostomy status (HCC)   Decubitus ulcer of buttock, unstageable ([place])   1. Acute on chronic respiratory failure with hypoxia we will continue with T collar for now she is at her baseline.  Continue pulmonary toilet secretion management. 2. Quadriplegia quadriparesis patient is at baseline 3. Lobar pneumonia treated patient has a continued airspace opacities  noted on the chest film.  Could be scarring versus atelectasis.  However concern would be for aspiration pneumonia and will need to monitor this closely 4. High risk for sepsis continue with supportive care 5. Tracheostomy will remain in place 6. Decubitus ulcer followed by wound care   I have personally seen  and evaluated the patient, evaluated laboratory and imaging results, formulated the assessment and plan and placed orders. The Patient requires high complexity decision making for assessment and support.  Case was discussed on Rounds with the Respiratory Therapy Staff  Allyne Gee, MD [facility] Pulmonary Critical Care Medicine Sleep Medicine

## 2018-01-24 LAB — BASIC METABOLIC PANEL
ANION GAP: 14 (ref 5–15)
BUN: 22 mg/dL — ABNORMAL HIGH (ref 6–20)
CO2: 28 mmol/L (ref 22–32)
Calcium: 10.1 mg/dL (ref 8.9–10.3)
Chloride: 93 mmol/L — ABNORMAL LOW (ref 98–111)
Creatinine, Ser: 0.58 mg/dL (ref 0.44–1.00)
GFR calc Af Amer: >60 mL/min (ref >60)
GFR calc non Af Amer: >60 mL/min (ref >60)
Glucose, Bld: 159 mg/dL — ABNORMAL HIGH (ref 70–99)
Potassium: 4.6 mmol/L (ref 3.5–5.1)
Sodium: 135 mmol/L (ref 135–145)

## 2018-01-24 LAB — CBC
HCT: 30.3 % — ABNORMAL LOW (ref 36.0–46.0)
Hemoglobin: 8.9 g/dL — ABNORMAL LOW (ref 12.0–15.0)
MCH: 28.1 pg (ref 26.0–34.0)
MCHC: 29.4 g/dL — ABNORMAL LOW (ref 30.0–36.0)
MCV: 95.6 fL (ref 80.0–100.0)
NRBC: 0.2 % (ref 0.0–0.2)
Platelets: 659 10*3/uL — ABNORMAL HIGH (ref 150–400)
RBC: 3.17 MIL/uL — ABNORMAL LOW (ref 3.87–5.11)
RDW: 17.4 % — ABNORMAL HIGH (ref 11.5–15.5)
WBC: 12.9 10*3/uL — ABNORMAL HIGH (ref 4.0–10.5)

## 2018-01-24 LAB — VANCOMYCIN, TROUGH: Vancomycin Tr: 14 ug/mL — ABNORMAL LOW (ref 15–20)

## 2018-01-24 LAB — MAGNESIUM: Magnesium: 2.3 mg/dL (ref 1.7–2.4)

## 2018-01-24 NOTE — Progress Notes (Signed)
Pulmonary Critical Care Medicine [place]   PULMONARY CRITICAL CARE SERVICE  PROGRESS NOTE  Date of Service: 01/24/2018  Angela Page  [id number]  DOB: Sep 12, 1970   DOA: 12/31/2017  Referring Physician: Merton Border, MD  HPI: Angela Page is a 48 y.o. female seen for follow up of Acute on Chronic Respiratory Failure.  Patient is on T collar she had been fed the other day and has now been having some low-grade fevers also x-ray changes probable aspiration  Medications: Reviewed on Rounds  Physical Exam:  Vitals: Temperature 98.6 pulse 85 respiratory 16 blood pressure 113/62 saturations 95%  Ventilator Settings off the ventilator on T collar FiO2 35%  . General: Comfortable at this time . Eyes: Grossly normal lids, irises & conjunctiva . ENT: grossly tongue is normal . Neck: no obvious mass . Cardiovascular: S1 S2 normal no gallop . Respiratory: No rhonchi or rales are noted at this time . Abdomen: soft . Skin: no rash seen on limited exam . Musculoskeletal: not rigid . Psychiatric:unable to assess . Neurologic: no seizure no involuntary movements         Lab Data:   Basic Metabolic Panel: Recent Labs  Lab 01/20/18 0636 01/24/18 0611  NA 136 135  K 4.1 4.6  CL 93* 93*  CO2 33* 28  GLUCOSE 135* 159*  BUN 28* 22*  CREATININE 0.67 0.58  CALCIUM 10.3 10.1  MG 2.5* 2.3    ABG: No results for input(s): PHART, PCO2ART, PO2ART, HCO3, O2SAT in the last 168 hours.  Liver Function Tests: No results for input(s): AST, ALT, ALKPHOS, BILITOT, PROT, ALBUMIN in the last 168 hours. No results for input(s): LIPASE, AMYLASE in the last 168 hours. No results for input(s): AMMONIA in the last 168 hours.  CBC: Recent Labs  Lab 01/20/18 0636 01/24/18 0611  WBC 14.3* 12.9*  HGB 8.8* 8.9*  HCT 28.6* 30.3*  MCV 95.3 95.6  PLT 701* 659*    Cardiac Enzymes: No results for input(s): CKTOTAL, CKMB, CKMBINDEX, TROPONINI in the last 168  hours.  BNP (last 3 results) No results for input(s): BNP in the last 8760 hours.  ProBNP (last 3 results) No results for input(s): PROBNP in the last 8760 hours.  Radiological Exams: Dg Chest Port 1 View  Result Date: 01/23/2018 CLINICAL DATA:  48 year old female with fever and chronic ventilator dependence EXAM: PORTABLE CHEST 1 VIEW COMPARISON:  Prior chest x-ray 01/13/2018 FINDINGS: Tracheostomy tube remains stable in position. The tip is midline and at the level of the clavicles. Stable position of left subclavian approach cardiac rhythm maintenance device with leads projecting over the right atrium and ventricle. Unchanged cardiomegaly. Similar appearance of patchy bibasilar and left mid lung airspace opacities. Background bronchitic changes are also similar. No definite new acute airspace disease. No pneumothorax. No acute osseous abnormality. IMPRESSION: 1. Stable support apparatus. 2. Similar appearance of patchy airspace opacities in both lung bases and the left mid lung compared to 01/13/2018. Differential considerations include persistent multilobar pneumonia versus chronic atelectasis/scarring. 3. Electronically Signed   By: Jacqulynn Cadet M.D.   On: 01/23/2018 11:49    Assessment/Plan Active Problems:   Acute on chronic respiratory failure with hypoxia (HCC)   Quadriplegia and quadriparesis (HCC)   Lobar pneumonia (HCC)   At high risk for severe sepsis   Tracheostomy status (HCC)   Decubitus ulcer of buttock, unstageable ([place])   1. Acute on chronic respiratory failure with hypoxia patient currently is on T collar has been on  35% FiO2 good saturations are noted at this time.  The patient needs to be monitored for any decompensation also discussed with primary care team regarding antibiotics. 2. Quadriplegia at baseline continue with supportive care 3. Lobar pneumonia probably now aspirated will need antibiotic adjustment accordingly. 4. High risk for sepsis has been having  low-grade fevers need to continue to monitor 5. Tracheostomy permanent remains in place 6. Decubitus ulcers treated with wound care   I have personally seen and evaluated the patient, evaluated laboratory and imaging results, formulated the assessment and plan and placed orders. The Patient requires high complexity decision making for assessment and support.  Case was discussed on Rounds with the Respiratory Therapy Staff  Allyne Gee, MD [facility] Pulmonary Critical Care Medicine Sleep Medicine

## 2018-01-25 NOTE — Progress Notes (Signed)
Pulmonary Critical Care Medicine [place]   PULMONARY CRITICAL CARE SERVICE  PROGRESS NOTE  Date of Service: 01/25/2018  Angela Page  [id number]  DOB: 10/07/1970   DOA: 12/31/2017  Referring Physician: Merton Border, MD  HPI: Angela Page is a 48 y.o. female seen for follow up of Acute on Chronic Respiratory Failure.  Currently is on T collar patient is comfortable without distress she is at baseline  Medications: Reviewed on Rounds  Physical Exam:  Vitals: Temperature 98.0 pulse 92 respiratory 20 blood pressure 98/61 saturations 97%  Ventilator Settings off the ventilator right now on 28%  . General: Comfortable at this time . Eyes: Grossly normal lids, irises & conjunctiva . ENT: grossly tongue is normal . Neck: no obvious mass . Cardiovascular: S1 S2 normal no gallop . Respiratory: No rhonchi or rales are noted at this time . Abdomen: soft . Skin: no rash seen on limited exam . Musculoskeletal: not rigid . Psychiatric:unable to assess . Neurologic: no seizure no involuntary movements         Lab Data:   Basic Metabolic Panel: Recent Labs  Lab 01/20/18 0636 01/24/18 0611  NA 136 135  K 4.1 4.6  CL 93* 93*  CO2 33* 28  GLUCOSE 135* 159*  BUN 28* 22*  CREATININE 0.67 0.58  CALCIUM 10.3 10.1  MG 2.5* 2.3    ABG: No results for input(s): PHART, PCO2ART, PO2ART, HCO3, O2SAT in the last 168 hours.  Liver Function Tests: No results for input(s): AST, ALT, ALKPHOS, BILITOT, PROT, ALBUMIN in the last 168 hours. No results for input(s): LIPASE, AMYLASE in the last 168 hours. No results for input(s): AMMONIA in the last 168 hours.  CBC: Recent Labs  Lab 01/20/18 0636 01/24/18 0611  WBC 14.3* 12.9*  HGB 8.8* 8.9*  HCT 28.6* 30.3*  MCV 95.3 95.6  PLT 701* 659*    Cardiac Enzymes: No results for input(s): CKTOTAL, CKMB, CKMBINDEX, TROPONINI in the last 168 hours.  BNP (last 3 results) No results for input(s): BNP in the  last 8760 hours.  ProBNP (last 3 results) No results for input(s): PROBNP in the last 8760 hours.  Radiological Exams: No results found.  Assessment/Plan Active Problems:   Acute on chronic respiratory failure with hypoxia (HCC)   Quadriplegia and quadriparesis (HCC)   Lobar pneumonia (HCC)   At high risk for severe sepsis   Tracheostomy status (HCC)   Decubitus ulcer of buttock, unstageable ([place])   1. Acute on chronic respiratory failure with hypoxia patient is currently on T collar supportive care will be continued at baseline 2. Quadriplegia at baseline we will continue present management 3. Lobar pneumonia treated 4. High risk sepsis treated 5. Tracheostomy will remain in place 6. Decubitus ulcer wound care management   I have personally seen and evaluated the patient, evaluated laboratory and imaging results, formulated the assessment and plan and placed orders. The Patient requires high complexity decision making for assessment and support.  Case was discussed on Rounds with the Respiratory Therapy Staff  Allyne Gee, MD [facility] Pulmonary Critical Care Medicine Sleep Medicine

## 2018-01-26 NOTE — Progress Notes (Signed)
Pulmonary Critical Care Medicine [place]   PULMONARY CRITICAL CARE SERVICE  PROGRESS NOTE  Date of Service: 01/26/2018  Angela Page  MOQ:[id number]  DOB: Apr 15, 1970   DOA: 12/31/2017  Referring Physician: Merton Border, MD  HPI: Angela Page is a 48 y.o. female seen for follow up of Acute on Chronic Respiratory Failure.  Patient is on T collar she is at baseline.  Has been doing better as far as fevers are concerned  Medications: Reviewed on Rounds  Physical Exam:  Vitals: Temperature 98.3 pulse 101 respiratory 17 blood pressure 136/60 saturations 100%  Ventilator Settings off ventilator on T collar right now  . General: Comfortable at this time . Eyes: Grossly normal lids, irises & conjunctiva . ENT: grossly tongue is normal . Neck: no obvious mass . Cardiovascular: S1 S2 normal no gallop . Respiratory: No rhonchi or rales are noted . Abdomen: soft . Skin: no rash seen on limited exam . Musculoskeletal: not rigid . Psychiatric:unable to assess . Neurologic: no seizure no involuntary movements         Lab Data:   Basic Metabolic Panel: Recent Labs  Lab 01/20/18 0636 01/24/18 0611  NA 136 135  K 4.1 4.6  CL 93* 93*  CO2 33* 28  GLUCOSE 135* 159*  BUN 28* 22*  CREATININE 0.67 0.58  CALCIUM 10.3 10.1  MG 2.5* 2.3    ABG: No results for input(s): PHART, PCO2ART, PO2ART, HCO3, O2SAT in the last 168 hours.  Liver Function Tests: No results for input(s): AST, ALT, ALKPHOS, BILITOT, PROT, ALBUMIN in the last 168 hours. No results for input(s): LIPASE, AMYLASE in the last 168 hours. No results for input(s): AMMONIA in the last 168 hours.  CBC: Recent Labs  Lab 01/20/18 0636 01/24/18 0611  WBC 14.3* 12.9*  HGB 8.8* 8.9*  HCT 28.6* 30.3*  MCV 95.3 95.6  PLT 701* 659*    Cardiac Enzymes: No results for input(s): CKTOTAL, CKMB, CKMBINDEX, TROPONINI in the last 168 hours.  BNP (last 3 results) No results for input(s): BNP in  the last 8760 hours.  ProBNP (last 3 results) No results for input(s): PROBNP in the last 8760 hours.  Radiological Exams: No results found.  Assessment/Plan Active Problems:   Acute on chronic respiratory failure with hypoxia (HCC)   Quadriplegia and quadriparesis (HCC)   Lobar pneumonia (HCC)   At high risk for severe sepsis   Tracheostomy status (HCC)   Decubitus ulcer of buttock, unstageable ([place])   1. Acute on chronic respiratory failure with hypoxia we will continue with T collar continue pulmonary toilet supportive care.  Patient saturations are excellent 2. Quadriplegia she is at baseline we will continue with supportive care. 3. Lobar pneumonia treated improved 4. High risk sepsis stable at this time 5. Tracheostomy remains in place 6. Decubitus ulcers treated by wound care   I have personally seen and evaluated the patient, evaluated laboratory and imaging results, formulated the assessment and plan and placed orders. The Patient requires high complexity decision making for assessment and support.  Case was discussed on Rounds with the Respiratory Therapy Staff  Allyne Gee, MD [facility] Pulmonary Critical Care Medicine Sleep Medicine

## 2018-01-27 LAB — CBC
HCT: 26.7 % — ABNORMAL LOW (ref 36.0–46.0)
Hemoglobin: 8 g/dL — ABNORMAL LOW (ref 12.0–15.0)
MCH: 28.4 pg (ref 26.0–34.0)
MCHC: 30 g/dL (ref 30.0–36.0)
MCV: 94.7 fL (ref 80.0–100.0)
Platelets: 628 10*3/uL — ABNORMAL HIGH (ref 150–400)
RBC: 2.82 MIL/uL — ABNORMAL LOW (ref 3.87–5.11)
RDW: 17.1 % — ABNORMAL HIGH (ref 11.5–15.5)
WBC: 14.6 10*3/uL — ABNORMAL HIGH (ref 4.0–10.5)
nRBC: 0.2 % (ref 0.0–0.2)

## 2018-01-27 LAB — PHOSPHORUS: Phosphorus: 4 mg/dL (ref 2.5–4.6)

## 2018-01-27 LAB — BASIC METABOLIC PANEL
Anion gap: 11 (ref 5–15)
BUN: 23 mg/dL — ABNORMAL HIGH (ref 6–20)
CO2: 32 mmol/L (ref 22–32)
Calcium: 10.5 mg/dL — ABNORMAL HIGH (ref 8.9–10.3)
Chloride: 94 mmol/L — ABNORMAL LOW (ref 98–111)
Creatinine, Ser: 0.55 mg/dL (ref 0.44–1.00)
GFR calc Af Amer: >60 mL/min (ref >60)
GFR calc non Af Amer: >60 mL/min (ref >60)
Glucose, Bld: 178 mg/dL — ABNORMAL HIGH (ref 70–99)
Potassium: 4.2 mmol/L (ref 3.5–5.1)
SODIUM: 137 mmol/L (ref 135–145)

## 2018-01-27 LAB — MAGNESIUM: MAGNESIUM: 2.1 mg/dL (ref 1.7–2.4)

## 2018-01-27 LAB — VANCOMYCIN, TROUGH: Vancomycin Tr: 21 ug/mL (ref 15–20)

## 2018-01-27 NOTE — Progress Notes (Signed)
Pulmonary Critical Care Medicine [place]   PULMONARY CRITICAL CARE SERVICE  PROGRESS NOTE  Date of Service: 01/27/2018  Angela Page  [id number]  DOB: [birth year]   DOA: 12/31/2017  Referring Physician: Merton Border, MD  HPI: Angela Page is a 48 y.o. female seen for follow up of Acute on Chronic Respiratory Failure.  At this time patient is on T collar possible discharge soon  Medications: Reviewed on Rounds  Physical Exam:  Vitals: Temperature 98.2 pulse 94 respiratory rate 15 blood pressure 118/73 saturations 96%  Ventilator Settings off the ventilator on T collar right now FiO2 28%  . General: Comfortable at this time . Eyes: Grossly normal lids, irises & conjunctiva . ENT: grossly tongue is normal . Neck: no obvious mass . Cardiovascular: S1 S2 normal no gallop . Respiratory: No rhonchi or rales are noted at this time . Abdomen: soft . Skin: no rash seen on limited exam . Musculoskeletal: not rigid . Psychiatric:unable to assess . Neurologic: no seizure no involuntary movements         Lab Data:   Basic Metabolic Panel: Recent Labs  Lab 01/24/18 0611 01/27/18 0733  NA 135 137  K 4.6 4.2  CL 93* 94*  CO2 28 32  GLUCOSE 159* 178*  BUN 22* 23*  CREATININE 0.58 0.55  CALCIUM 10.1 10.5*  MG 2.3 2.1  PHOS  --  4.0    ABG: No results for input(s): PHART, PCO2ART, PO2ART, HCO3, O2SAT in the last 168 hours.  Liver Function Tests: No results for input(s): AST, ALT, ALKPHOS, BILITOT, PROT, ALBUMIN in the last 168 hours. No results for input(s): LIPASE, AMYLASE in the last 168 hours. No results for input(s): AMMONIA in the last 168 hours.  CBC: Recent Labs  Lab 01/24/18 0611 01/27/18 0733  WBC 12.9* 14.6*  HGB 8.9* 8.0*  HCT 30.3* 26.7*  MCV 95.6 94.7  PLT 659* 628*    Cardiac Enzymes: No results for input(s): CKTOTAL, CKMB, CKMBINDEX, TROPONINI in the last 168 hours.  BNP (last 3 results) No results for input(s):  BNP in the last 8760 hours.  ProBNP (last 3 results) No results for input(s): PROBNP in the last 8760 hours.  Radiological Exams: No results found.  Assessment/Plan Active Problems:   Acute on chronic respiratory failure with hypoxia (HCC)   Quadriplegia and quadriparesis (HCC)   Lobar pneumonia (HCC)   At high risk for severe sepsis   Tracheostomy status (HCC)   Decubitus ulcer of buttock, unstageable ([place])   1. Acute on chronic respiratory failure with hypoxia we will continue with T collar trials and PMV continue with pulmonary toilet secretion management 2. Lobar pneumonia treated clinically improved 3. High risk for sepsis stable 4. Tracheostomy will remain in place 5. Decubitus following wound care   I have personally seen and evaluated the patient, evaluated laboratory and imaging results, formulated the assessment and plan and placed orders. The Patient requires high complexity decision making for assessment and support.  Case was discussed on Rounds with the Respiratory Therapy Staff  Allyne Gee, MD [facility] Pulmonary Critical Care Medicine Sleep Medicine

## 2018-01-29 ENCOUNTER — Other Ambulatory Visit: Payer: Self-pay

## 2018-01-29 ENCOUNTER — Emergency Department (HOSPITAL_COMMUNITY): Payer: Medicare Other

## 2018-01-29 ENCOUNTER — Encounter (HOSPITAL_COMMUNITY): Payer: Self-pay | Admitting: Physician Assistant

## 2018-01-29 ENCOUNTER — Encounter (HOSPITAL_COMMUNITY): Payer: Self-pay | Admitting: Emergency Medicine

## 2018-01-29 ENCOUNTER — Emergency Department (HOSPITAL_COMMUNITY)
Admission: EM | Admit: 2018-01-29 | Discharge: 2018-01-30 | Disposition: A | Discharge status: Home / Self Care | Payer: Medicare Other | Source: Home / Self Care | Attending: Emergency Medicine | Admitting: Emergency Medicine

## 2018-01-29 DIAGNOSIS — R0602 Shortness of breath: Secondary | ICD-10-CM

## 2018-01-29 DIAGNOSIS — Z794 Long term (current) use of insulin: Secondary | ICD-10-CM | POA: Insufficient documentation

## 2018-01-29 DIAGNOSIS — Z430 Encounter for attention to tracheostomy: Secondary | ICD-10-CM

## 2018-01-29 DIAGNOSIS — Z79899 Other long term (current) drug therapy: Secondary | ICD-10-CM

## 2018-01-29 DIAGNOSIS — Z950 Presence of cardiac pacemaker: Secondary | ICD-10-CM | POA: Insufficient documentation

## 2018-01-29 DIAGNOSIS — I110 Hypertensive heart disease with heart failure: Secondary | ICD-10-CM | POA: Insufficient documentation

## 2018-01-29 DIAGNOSIS — E119 Type 2 diabetes mellitus without complications: Secondary | ICD-10-CM

## 2018-01-29 DIAGNOSIS — I5032 Chronic diastolic (congestive) heart failure: Secondary | ICD-10-CM

## 2018-01-29 DIAGNOSIS — F17210 Nicotine dependence, cigarettes, uncomplicated: Secondary | ICD-10-CM

## 2018-01-29 DIAGNOSIS — R05 Cough: Secondary | ICD-10-CM | POA: Insufficient documentation

## 2018-01-29 LAB — CBC WITH DIFFERENTIAL/PLATELET
Abs Immature Granulocytes: 0.07 10*3/uL (ref 0.00–0.07)
Basophils Absolute: 0.1 10*3/uL (ref 0.0–0.1)
Basophils Relative: 1 %
Eosinophils Absolute: 0.4 10*3/uL (ref 0.0–0.5)
Eosinophils Relative: 3 %
HEMATOCRIT: 29.3 % — AB (ref 36.0–46.0)
HEMOGLOBIN: 8.6 g/dL — AB (ref 12.0–15.0)
Immature Granulocytes: 0 %
LYMPHS PCT: 10 %
Lymphs Abs: 1.6 10*3/uL (ref 0.7–4.0)
MCH: 27.1 pg (ref 26.0–34.0)
MCHC: 29.4 g/dL — ABNORMAL LOW (ref 30.0–36.0)
MCV: 92.4 fL (ref 80.0–100.0)
MONO ABS: 1.1 10*3/uL — AB (ref 0.1–1.0)
Monocytes Relative: 7 %
Neutro Abs: 13.6 10*3/uL — ABNORMAL HIGH (ref 1.7–7.7)
Neutrophils Relative %: 79 %
Platelets: 667 10*3/uL — ABNORMAL HIGH (ref 150–400)
RBC: 3.17 MIL/uL — ABNORMAL LOW (ref 3.87–5.11)
RDW: 17 % — ABNORMAL HIGH (ref 11.5–15.5)
WBC: 16.9 10*3/uL — ABNORMAL HIGH (ref 4.0–10.5)
nRBC: 0 % (ref 0.0–0.2)

## 2018-01-29 LAB — COMPREHENSIVE METABOLIC PANEL
ALT: 23 U/L (ref 0–44)
AST: 26 U/L (ref 15–41)
Albumin: 3.4 g/dL — ABNORMAL LOW (ref 3.5–5.0)
Alkaline Phosphatase: 163 U/L — ABNORMAL HIGH (ref 38–126)
Anion gap: 14 (ref 5–15)
BILIRUBIN TOTAL: 0.6 mg/dL (ref 0.3–1.2)
BUN: 33 mg/dL — ABNORMAL HIGH (ref 6–20)
CO2: 30 mmol/L (ref 22–32)
CREATININE: 0.56 mg/dL (ref 0.44–1.00)
Calcium: 10.2 mg/dL (ref 8.9–10.3)
Chloride: 93 mmol/L — ABNORMAL LOW (ref 98–111)
GFR calc Af Amer: >60 mL/min (ref >60)
Glucose, Bld: 144 mg/dL — ABNORMAL HIGH (ref 70–99)
Potassium: 4.2 mmol/L (ref 3.5–5.1)
Sodium: 137 mmol/L (ref 135–145)
Total Protein: 8.6 g/dL — ABNORMAL HIGH (ref 6.5–8.1)

## 2018-01-29 MED ORDER — OXYCODONE HCL 5 MG PO TABS: 5.0000 mg | ORAL_TABLET | Freq: Once | ORAL | Status: DC

## 2018-01-29 MED ORDER — STERILE WATER FOR INJECTION IJ SOLN
INTRAMUSCULAR | Status: AC
  Filled 2018-01-29: qty 10

## 2018-01-29 MED ORDER — OXYCODONE HCL 5 MG/5ML PO SOLN
5.0000 mg | Freq: Once | ORAL | Status: AC
  Administered 2018-01-29: 5 mg
  Filled 2018-01-29: qty 5

## 2018-01-29 MED ORDER — OXYCODONE HCL 5 MG/5ML PO SOLN
5.0000 mg | Freq: Once | ORAL | Status: AC
  Administered 2018-01-30: 5 mg
  Filled 2018-01-29: qty 5

## 2018-01-29 NOTE — ED Notes (Signed)
Pt requesting pain medications.

## 2018-01-29 NOTE — ED Triage Notes (Addendum)
Per EMS, patient from [facility], called out for low O2. Deep suctioned by EMS, increasing O2 sat. 2L humidified O2 at baseline. Patient has a trach. Hx quadriplegia. Recent 2 month admission at [facility] for new diabetes diagnosis and resp failure. A&Ox4.  Patient c/o chronic left shoulder and neck pain x2 hours. Denies injury. States "I am overdue for my pain medications." Denise SOB and CP at this time.

## 2018-01-29 NOTE — ED Notes (Signed)
Patient requesting pain medication-PA made aware. 

## 2018-01-29 NOTE — ED Notes (Signed)
Patient incontinent of stool. Bed pad changed. Wound dressings saturated in stool. Dressings changed.

## 2018-01-29 NOTE — Discharge Instructions (Signed)
You have been diagnosed today with need for Tracheostomy Care.  At this time there does not appear to be the presence of an emergent medical condition, however there is always the potential for conditions to change. Please read and follow the below instructions.  Please return to the Emergency Department immediately for any new or worsening symptoms or if your symptoms return. Please be sure to follow up with your Primary Care Provider this week regarding your visit today; please call their office to schedule an appointment even if you are feeling better for a follow-up visit. Please allow nursing staff to clean your tracheostomy tube to avoid further congestion.  Get help right away if: Your shortness of breath returns You have shortness of breath when you are resting. You feel light-headed or you faint. You have a cough that is not controlled with medicines. You cough up blood. You have pain with breathing. You have pain in your chest, arms, shoulders, or abdomen. You have a fever. You cannot walk up stairs or exercise the way that you normally do.  Please read the additional information packets attached to your discharge summary.  Do not take your medicine if  develop an itchy rash, swelling in your mouth or lips, or difficulty breathing.

## 2018-01-29 NOTE — Progress Notes (Addendum)
Consult request has been received. CSW attempting to follow up at present time.  CSW spoke to EPD who stated pt feared returning to her SNF.  Per EPD, pt was at [facility] for one day, was suctioned and then pt's inability breath was such that pt was sent to the ED by [facility].    Per EPD, pt was suctioned at the ED and the pt's ability to breath almost immediately improved which causes the pt fear due to the pt's resulting belief that the staff at [facility] lack the ability to adequately suction the pt safely.  CSW called Tammy in admissions's secure VM at [facility] and requested a return call regarding pt's fears about returning for breathing/sunctioning concerns.  CSW received a call back from pt's admissions director Tammy who stated she called the Director of Nursing at [facility] and senior staff who reviewed the situation with staff who relayed their concern that although earlier in the day the pt had been effusive in her praise of the staff's ability to care for her the pt presented with anxiety when being suctioned which staff stated made it difficult to suction the pt which in turn caused the pt to be more anxious and that they suggested that the EPD discuss the pt's anxiety with the pt and possibly discuss some soultions to the pt's anxiety regarding suction which the staff feels is natural and understandable.  EPD updated and stated EPD will discuss the above with the pt..  CSW will continue to follow for D/C needs.  Alphonse Guild. Chanze Teagle, LCSW, LCAS, CSI Clinical Social Worker Ph: [phone]

## 2018-01-29 NOTE — ED Notes (Signed)
Respiratory made at bedside.

## 2018-01-29 NOTE — ED Notes (Signed)
Respiratory made aware PA is requesting suction.

## 2018-01-29 NOTE — ED Notes (Addendum)
Patient reports feeling "hot." Oral temperature 99.3. PA made aware.

## 2018-01-29 NOTE — ED Notes (Signed)
Attempted to call reports to [facility] with no answer.

## 2018-01-29 NOTE — ED Notes (Addendum)
Respiratory made aware patient is in the ED in NAD at this time.

## 2018-01-29 NOTE — ED Provider Notes (Signed)
[facility] DEPT Provider Note   CSN: [id number] Arrival date & time: 01/29/18  1359     History   Chief Complaint Chief Complaint  Patient presents with  . Shortness of Breath    HPI Angela Page is a 48 y.o. female.  The history is provided by the patient. No language interpreter was used.  Shortness of Breath  Severity:  Moderate Onset quality:  Gradual Timing:  Constant Progression:  Worsening Chronicity:  New Relieved by: suction. Worsened by:  Nothing Ineffective treatments:  None tried Associated symptoms: cough   Associated symptoms: no chest pain   Pt reports she is at [facility] for speech therapy and swallowing.   Pt reports they are having trouble suctioning her trach.  Pt reports increased mucus    Past Medical History:  Diagnosis Date  . Acute on chronic respiratory failure with hypoxia ([place])   . Anasarca 06/10/2016  . Asthma   . At high risk for severe sepsis   . Cocaine use 10/08/2013  . Decubitus ulcer of buttock, unstageable ([place])   . Depression   . GERD (gastroesophageal reflux disease)   . HCAP (healthcare-associated pneumonia) 06/09/2016  . Hepatitis    hx of hepatits frm mono   . Kidney stone   . Lobar pneumonia ([place])   . Overdose of opiate or related narcotic ([place]) 09/02/2014  . Pacemaker   . Pleurisy   . Polysubstance dependence including opioid type drug, episodic abuse ([place]) 10/27/2013  . Protein calorie malnutrition ([place]) 10/31/2015  . Quadriparesis ([place])   . Quadriplegia and quadriparesis ([place])   . Quadriplegia, C5-C7 incomplete ([place]) 10/08/2013  . Stage IV pressure ulcer of sacral region ([place]) 10/31/2015  . Tracheostomy status [facility])     Patient Active Problem List   Diagnosis Date Noted  . Acute on chronic respiratory failure with hypoxia ([place])   . Quadriplegia and quadriparesis ([place])   . Lobar pneumonia ([place])   . At high risk for severe sepsis   . Tracheostomy status ([place])   .  Decubitus ulcer of buttock, unstageable ([place])   . Hyperglycemic hyperosmolar nonketotic coma ([place]) 12/16/2017  . Lactic acid acidosis 12/16/2017  . AKI (acute kidney injury) ([place]) 12/16/2017  . Suprapubic catheter ([place]) 12/16/2017  . [place] (hyperglycemic hyperosmolar nonketotic coma) ([place]) 12/16/2017  . Elevated lactic acid level   . Pressure injury of skin 07/17/2017  . Kidney stone 07/16/2017  . Right ureteral stone 03/10/2017  . UTI (urinary tract infection) 03/09/2017  . Ineffective airway clearance   . Sepsis secondary to UTI ([place]) 06/30/2016  . Autonomic neuropathy 06/10/2016  . Presence of permanent cardiac pacemaker 06/10/2016  . 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 Chronically on opiate therapy 06/10/2016  . Indwelling Foley catheter present 06/10/2016  . PEG (percutaneous endoscopic gastrostomy) status ([place]) 06/10/2016  . Pulmonary hypertension ([place]) 06/10/2016  . Hypoalbuminemia 06/10/2016  . Abnormal transaminases   . Peripheral edema   . Tracheostomy dependence ([place])   . Chronic obstructive pulmonary disease ([place])   . Chronic diastolic CHF (congestive heart failure) ([place]) 01/23/2016  . Tracheostomy status ([place])   . Esophageal dysphagia   . Malnutrition of moderate degree 11/01/2015  . Tobacco abuse 10/31/2015  . Asthma 10/31/2015  . History of pneumonia 10/08/2015  . Pressure ulcer of contiguous region involving buttock and hip, stage 4 ([place]) 02/26/2015  . Hypotension 02/25/2015  . Leukocytosis 10/23/2014  . Hypokalemia 10/23/2014  . Decubitus ulcer of sacral region, stage 4 (  [place]) 10/23/2014  . Anemia, iron deficiency 10/23/2014  . Quadriplegia, C5-C7 incomplete ([place]) 10/22/2014  . Depression   . Protein-calorie malnutrition, severe ([place]) 09/05/2014  . 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 Recurrent UTI 03/31/2014  . Pressure ulcer of coccygeal region, stage 4 ([place]) 01/06/2014  . Stage 4 skin ulcer of sacral region ([place])  01/06/2014  . Neuropathic pain 11/30/2013  . Paraplegia following spinal cord injury ([place]) 11/30/2013  . Spinal cord injury, cervical region ([place]) 11/30/2013  . Chronic pain due to injury 11/30/2013  . S/P cervical spinal fusion 10/27/2013  . Tracheostomy care ([place]) 10/27/2013  . 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 SCI (spinal cord injury) 10/11/2013    Past Surgical History:  Procedure Laterality Date  . APPENDECTOMY    . BACK SURGERY  10/08/2013   fusion of spine, cervical region  . CARDIAC SURGERY    . CYSTOSCOPY W/ URETERAL STENT PLACEMENT Right 03/09/2017   Procedure: CYSTOSCOPY [name] STENT PLACEMENT;  Surgeon: Irine Seal, MD;  Location: [facility];  Service: Urology;  Laterality: Right;  . CYSTOSCOPY W/ URETERAL STENT REMOVAL Right 07/16/2017   Procedure: CYSTOSCOPY WITH STENT REMOVAL;  Surgeon: Franchot Gallo, MD;  Location: [facility];  Service: Urology;  Laterality: Right;  . CYSTOSCOPY/URETEROSCOPY/HOLMIUM LASER/STENT PLACEMENT Right 07/16/2017   Procedure: CYSTOSCOPY/URETEROSCOPY/HOLMIUM LASER/STENT PLACEMENT/ALSO STONE EXTRACTION;  Surgeon: Franchot Gallo, MD;  Location: [facility];  Service: Urology;  Laterality: Right;  . GASTROSTOMY  11/23/2015  . I&D EXTREMITY  10/28/2011   Procedure: IRRIGATION AND DEBRIDEMENT EXTREMITY;  Surgeon: Tennis Must, MD;  Location: [facility];  Service: Orthopedics;  Laterality: Right;  Irrigation and debridement right middle finger  . INSERTION OF SUPRAPUBIC CATHETER N/A 07/28/2016   Procedure: INSERTION OF SUPRAPUBIC CATHETER;  Surgeon: Franchot Gallo, MD;  Location: [facility];  Service: Urology;  Laterality: N/A;  . IR GENERIC HISTORICAL  11/23/2015   IR GASTROSTOMY TUBE MOD SED 11/23/2015 [facility]-INTERV [facility]  . IR PATIENT EVAL TECH 0-60 MINS  12/18/2017  . IR REPLACE G-TUBE SIMPLE WO FLUORO  12/19/2017  . LITHOTRIPSY  08/2017  . TRACHEOSTOMY       OB History   No obstetric history on file.      Home Medications     Prior to Admission medications   Medication Sig Start Date End Date Taking? Authorizing Provider  acetaminophen (TYLENOL) 160 MG/5ML solution Place 20.3 mLs (650 mg total) into feeding tube every 6 (six) hours as needed for mild pain, headache or fever. 12/31/17   Arrien, Jimmy Picket, MD  ALPRAZolam Duanne Moron) 0.25 MG tablet Place 1 tablet (0.25 mg total) into feeding tube 3 (three) times daily as needed for anxiety. 12/31/17   Arrien, Jimmy Picket, MD  arformoterol (BROVANA) 15 MCG/2ML NEBU Take 2 mLs (15 mcg total) by nebulization 2 (two) times daily. 12/31/17 01/30/18  Arrien, Jimmy Picket, MD  baclofen (LIORESAL) 10 MG tablet Place 1 tablet (10 mg total) into feeding tube 3 (three) times daily. 12/31/17   Arrien, Jimmy Picket, MD  budesonide (PULMICORT) 0.5 MG/2ML nebulizer solution Take 2 mLs (0.5 mg total) by nebulization 2 (two) times daily. 12/31/17 01/30/18  Arrien, Jimmy Picket, MD  famotidine (PEPCID) 40 MG/5ML suspension Place 2.5 mLs (20 mg total) into feeding tube at bedtime. 12/31/17   Arrien, Jimmy Picket, MD  furosemide (LASIX) 40 MG tablet Place 1 tablet (40 mg total) into feeding tube daily. 01/01/18 01/31/18  Arrien, Jimmy Picket, MD  Hydrocortisone (GERHARDT'S BUTT CREAM) CREA Apply 1 application  topically 4 (four) times daily. 12/31/17 01/30/18  Arrien, Jimmy Picket, MD  ibuprofen (ADVIL,MOTRIN) 100 MG/5ML suspension Place 20 mLs (400 mg total) into feeding tube every 6 (six) hours as needed (intractable fever). 12/31/17 01/30/18  Arrien, Jimmy Picket, MD  insulin aspart (NOVOLOG) 100 UNIT/ML injection Inject 10 Units into the skin every 4 (four) hours. For glucose 150 to 200 use 1 units, for glucose 201-250 use 2 units, for 251 to 300 use 4 units, for 301 to 350 use 6 units, for 351 to 400 use 8 units, for 401 or greater use 10 units. 12/31/17   Arrien, Jimmy Picket, MD  insulin detemir (LEVEMIR) 100 UNIT/ML injection Inject 0.2 mLs (20 Units total) into  the skin daily. 01/01/18 01/31/18  Arrien, Jimmy Picket, MD  ipratropium (ATROVENT) 0.02 % nebulizer solution Take 2.5 mLs (0.5 mg total) by nebulization every 6 (six) hours. 12/31/17 01/30/18  Arrien, Jimmy Picket, MD  lactulose (CHRONULAC) 10 GM/15ML solution Place 15 mLs (10 g total) into feeding tube 2 (two) times daily. 12/31/17   Arrien, Jimmy Picket, MD  levalbuterol Penne Lash) 1.25 MG/0.5ML nebulizer solution Take 1.25 mg by nebulization every 6 (six) hours. 12/31/17 01/30/18  Arrien, Jimmy Picket, MD  midodrine (PROAMATINE) 10 MG tablet Place 1 tablet (10 mg total) into feeding tube 3 (three) times daily with meals. 12/31/17 01/30/18  Arrien, Jimmy Picket, MD  oxybutynin (DITROPAN) 5 MG tablet Place 1 tablet (5 mg total) into feeding tube every 8 (eight) hours as needed for bladder spasms. 12/31/17   Arrien, Jimmy Picket, MD  oxyCODONE (ROXICODONE) 5 MG/5ML solution Place 5 mLs (5 mg total) into feeding tube 3 (three) times daily. 12/31/17   Arrien, Jimmy Picket, MD  polyethylene glycol [facility] / Floria Raveling) packet Take 17 g by mouth daily. Hold if diarrhea. 01/01/18   Arrien, Jimmy Picket, MD    Family History Family History  Problem Relation Age of Onset  . Hypertension Maternal Uncle   . Kidney failure Maternal Uncle   . Colon cancer Mother   . Lung cancer Father   . Hypertension Brother   . Kidney failure Maternal Aunt   . Kidney failure Maternal Uncle   . Colon cancer Maternal Grandfather   . Esophageal cancer Neg Hx     Social History Social History   Tobacco Use  . Smoking status: Current Every Day Smoker    Packs/day: 1.00    Years: 25.00    Pack years: 25.00    Types: Cigarettes  Substance Use Topics  . Alcohol use: Never    Frequency: Never  . Drug use: No     Allergies   Erythromycin; Nitrofurantoin monohyd macro; Oxybutynin; and Penicillins   Review of Systems Review of Systems  HENT: Positive for trouble swallowing.   Respiratory:  Positive for cough and shortness of breath.   Cardiovascular: Negative for chest pain.  All other systems reviewed and are negative.    Physical Exam Updated Vital Signs BP 114/77   Pulse 87   Temp 97.9 F (36.6 C) (Oral)   Resp 16   Wt 53.1 kg   LMP 05/27/2016 (Within Days) Comment: Irregular periods since October 2015.  SpO2 100%   BMI 25.32 kg/m   Physical Exam Vitals signs and nursing note reviewed.  Constitutional:      Appearance: She is well-developed.  HENT:     Head: Normocephalic.     Mouth/Throat:     Comments: trach Neck:     Musculoskeletal: Normal range of  motion.  Cardiovascular:     Rate and Rhythm: Normal rate.  Pulmonary:     Effort: Pulmonary effort is normal.     Breath sounds: Examination of the right-upper field reveals rhonchi. Examination of the left-upper field reveals rhonchi. Rhonchi present.  Chest:     Chest wall: No mass.  Abdominal:     General: There is no distension.  Neurological:     Mental Status: She is alert and oriented to person, place, and time.  Psychiatric:        Mood and Affect: Mood normal.      ED Treatments / Results  Labs (all labs ordered are listed, but only abnormal results are displayed) Labs Reviewed - No data to display  EKG None  Radiology No results found.  Procedures Procedures (including critical care time)  Medications Ordered in ED Medications - No data to display   Initial Impression / Assessment and Plan / ED Course  I have reviewed the triage vital signs and the nursing notes.  Pertinent labs & imaging results that were available during my care of the patient were reviewed by me and considered in my medical decision making (see chart for details).     Resp Therapist in to suction.  Labs and chest xray ordered.  Pt's care turned over to [facility] at 4pm.  Final Clinical Impressions(s) / ED Diagnoses   Final diagnoses:  SOB (shortness of breath)  Tracheostomy care  [facility])    ED Discharge Orders    None       Sidney Ace 01/29/18 1534    Blanchie Dessert, MD 02/02/18 [phone]

## 2018-01-29 NOTE — ED Notes (Signed)
Patient requesting suction. O2 98%. Respiratory made aware.

## 2018-01-29 NOTE — ED Provider Notes (Signed)
Care handoff received from Alyse Low PA-C at shift change, please see her note for full details of encounter.  In short patient presenting with history of quadriplegia and tracheostomy tube from nursing facility.  Believe that inadequate suctioning of trach has led to patient having increased congestion and shortness of breath.  Patient suctioned by EMS prior to arrival with improvement of symptoms.  At time of handoff chest x-ray as well as lab work is pending, plan if no abnormalities found is likely to discuss case with social work about suctioning care.  Physical Exam  BP 114/77   Pulse 85   Temp 97.9 F (36.6 C) (Oral)   Resp 16   Wt 53.1 kg   LMP 05/27/2016 (Within Days) Comment: Irregular periods since October 2015.  SpO2 99%   BMI 25.32 kg/m   Physical Exam Constitutional:      General: She is not in acute distress.    Appearance: She is well-developed. She is not ill-appearing or diaphoretic.  HENT:     Head: Normocephalic and atraumatic.     Nose: Nose normal.     Mouth/Throat:     Lips: Pink.     Mouth: Mucous membranes are moist.     Pharynx: Oropharynx is clear. Uvula midline.  Eyes:     General: Vision grossly intact. Gaze aligned appropriately.     Extraocular Movements: Extraocular movements intact.     Conjunctiva/sclera: Conjunctivae normal.  Neck:     Musculoskeletal: Normal range of motion and neck supple.     Trachea: Tracheostomy present. No tracheal tenderness.  Cardiovascular:     Rate and Rhythm: Normal rate and regular rhythm.     Heart sounds: Normal heart sounds.  Pulmonary:     Effort: Pulmonary effort is normal. No accessory muscle usage or respiratory distress.     Breath sounds: Normal breath sounds and air entry. No decreased breath sounds or rhonchi.  Abdominal:     Palpations: Abdomen is soft.     Tenderness: There is no abdominal tenderness. There is no guarding or rebound.  Musculoskeletal:     Right lower leg: Normal.     Left lower  leg: Normal.  Skin:    General: Skin is warm and dry.  Neurological:     Mental Status: She is alert.     GCS: GCS eye subscore is 4. GCS verbal subscore is 5. GCS motor subscore is 6.     Comments: Patient has alert and oriented.  She is able to communicate adequately through trach.     ED Course/Procedures     Procedures  MDM  CMP appears baseline CBC with leukocytosis however this appears baseline from patient's recent blood counts, discussed with Dr. Sherry Ruffing who agrees. Chest x-ray:  IMPRESSION:  1. Stable cardiomegaly with mild central vascular congestion.  Bibasilar atelectasis and/or scarring.  2. Catheter from left arm approach terminates at the level of the  axilla.  3. Satisfactory tracheostomy tube position.   Patient states that she is feeling well and is without complaint following respiratory suction today.  Patient states that she feels her breathing is at baseline and is ready for discharge.  Patient was concerned that staff at her facility are not capable of caring for her tracheostomy tube.  Social work was consulted.  Our Education officer, museum here contacted Retail buyer at [facility] and had a long discussion.  Nursing staff there is capable of caring for patient's tracheostomy multiple patients with tracheostomies.  Patient was  reassured today and states that she is ready to be discharged back to [place].  Patient is without any additional complaints, states that she is feeling well and is at baseline.  At this time there does not appear to be any evidence of an acute emergency medical condition and the patient appears stable for discharge with appropriate outpatient follow up. Diagnosis was discussed with patient who verbalizes understanding of care plan and is agreeable to discharge. I have discussed return precautions with patient who verbalizes understanding of return precautions. Patient strongly encouraged to follow-up with their PCP within one week. All  questions answered.  After discharge while waiting for transfer back to facility patient reported to nursing staff that she is feeling warm.  Patient with temperature of 99.3 F.  I discussed this with the patient she states that she often has hot flashes and this is not abnormal for her.  Patient was given a cool towel by nursing staff and states that she now feels better.  Patient states that she is ready to return to the nursing facility.  Patient's case and results discussed with Dr. Sherry Ruffing who agrees with plan to discharge back to facility with follow-up.   Note: Portions of this report may have been transcribed using voice recognition software. Every effort was made to ensure accuracy; however, inadvertent computerized transcription errors may still be present.   Deliah Boston, PA-C 01/29/18 2316    Tegeler, Gwenyth Allegra, MD 01/29/18 [phone]

## 2018-01-29 NOTE — ED Notes (Signed)
Bed: WA07 Expected date:  Expected time:  Means of arrival:  Comments: EMS-low SPO2

## 2018-01-29 NOTE — ED Notes (Signed)
[facility] called for transport.  

## 2018-01-29 NOTE — Progress Notes (Signed)
Of note: RN please be advised that the staff at [facility] asks that when report is called to please include the time of the pt's last suctioning and what if anything was done to alleviate the pt's anxiety, ie.discussion with the pt regarding pt's anxiety about suctioning, medication assistance etc (if this was done).  CSW will continue to follow for D/C needs.  Alphonse Guild. Trae Bovenzi, LCSW, LCAS, CSI Clinical Social Worker Ph: [phone]

## 2018-01-29 NOTE — ED Notes (Signed)
XR at bedside

## 2018-01-30 NOTE — ED Notes (Signed)
Pt unable to sign for herself due to being quadriplegic.  Gave permission for RN to sign

## 2018-01-30 NOTE — ED Notes (Signed)
PTAR at bedside 

## 2018-01-31 ENCOUNTER — Emergency Department (HOSPITAL_COMMUNITY): Payer: Medicare Other

## 2018-01-31 ENCOUNTER — Encounter (HOSPITAL_COMMUNITY): Payer: Self-pay | Admitting: Nurse Practitioner

## 2018-01-31 ENCOUNTER — Inpatient Hospital Stay (HOSPITAL_COMMUNITY)
Admission: EM | Admit: 2018-01-31 | Discharge: 2018-02-03 | DRG: 871 | Disposition: A | Discharge status: Skilled Nursing Facility | Payer: Medicare Other | Attending: Internal Medicine | Admitting: Internal Medicine

## 2018-01-31 DIAGNOSIS — J440 Chronic obstructive pulmonary disease with acute lower respiratory infection: Secondary | ICD-10-CM | POA: Diagnosis present

## 2018-01-31 DIAGNOSIS — Z881 Allergy status to other antibiotic agents status: Secondary | ICD-10-CM

## 2018-01-31 DIAGNOSIS — Z880 Allergy status to penicillin: Secondary | ICD-10-CM

## 2018-01-31 DIAGNOSIS — Z79890 Hormone replacement therapy (postmenopausal): Secondary | ICD-10-CM | POA: Diagnosis not present

## 2018-01-31 DIAGNOSIS — Z930 Tracheostomy status: Secondary | ICD-10-CM

## 2018-01-31 DIAGNOSIS — Z888 Allergy status to other drugs, medicaments and biological substances status: Secondary | ICD-10-CM

## 2018-01-31 DIAGNOSIS — K219 Gastro-esophageal reflux disease without esophagitis: Secondary | ICD-10-CM | POA: Diagnosis present

## 2018-01-31 DIAGNOSIS — Z931 Gastrostomy status: Secondary | ICD-10-CM

## 2018-01-31 DIAGNOSIS — I9589 Other hypotension: Secondary | ICD-10-CM | POA: Diagnosis present

## 2018-01-31 DIAGNOSIS — J189 Pneumonia, unspecified organism: Secondary | ICD-10-CM | POA: Diagnosis present

## 2018-01-31 DIAGNOSIS — D509 Iron deficiency anemia, unspecified: Secondary | ICD-10-CM | POA: Diagnosis present

## 2018-01-31 DIAGNOSIS — A419 Sepsis, unspecified organism: Secondary | ICD-10-CM | POA: Diagnosis present

## 2018-01-31 DIAGNOSIS — Z950 Presence of cardiac pacemaker: Secondary | ICD-10-CM

## 2018-01-31 DIAGNOSIS — R0602 Shortness of breath: Secondary | ICD-10-CM | POA: Diagnosis present

## 2018-01-31 DIAGNOSIS — D638 Anemia in other chronic diseases classified elsewhere: Secondary | ICD-10-CM | POA: Diagnosis present

## 2018-01-31 DIAGNOSIS — I272 Pulmonary hypertension, unspecified: Secondary | ICD-10-CM | POA: Diagnosis present

## 2018-01-31 DIAGNOSIS — R532 Functional quadriplegia: Secondary | ICD-10-CM | POA: Diagnosis present

## 2018-01-31 DIAGNOSIS — I5032 Chronic diastolic (congestive) heart failure: Secondary | ICD-10-CM | POA: Diagnosis present

## 2018-01-31 DIAGNOSIS — L89154 Pressure ulcer of sacral region, stage 4: Secondary | ICD-10-CM | POA: Diagnosis present

## 2018-01-31 DIAGNOSIS — Z915 Personal history of self-harm: Secondary | ICD-10-CM | POA: Diagnosis not present

## 2018-01-31 DIAGNOSIS — Z981 Arthrodesis status: Secondary | ICD-10-CM | POA: Diagnosis not present

## 2018-01-31 DIAGNOSIS — Z79899 Other long term (current) drug therapy: Secondary | ICD-10-CM

## 2018-01-31 DIAGNOSIS — Y95 Nosocomial condition: Secondary | ICD-10-CM | POA: Diagnosis present

## 2018-01-31 DIAGNOSIS — F17210 Nicotine dependence, cigarettes, uncomplicated: Secondary | ICD-10-CM | POA: Diagnosis present

## 2018-01-31 DIAGNOSIS — Z794 Long term (current) use of insulin: Secondary | ICD-10-CM

## 2018-01-31 DIAGNOSIS — Z8249 Family history of ischemic heart disease and other diseases of the circulatory system: Secondary | ICD-10-CM

## 2018-01-31 DIAGNOSIS — Z8701 Personal history of pneumonia (recurrent): Secondary | ICD-10-CM

## 2018-01-31 DIAGNOSIS — E876 Hypokalemia: Secondary | ICD-10-CM | POA: Diagnosis present

## 2018-01-31 DIAGNOSIS — F329 Major depressive disorder, single episode, unspecified: Secondary | ICD-10-CM | POA: Diagnosis present

## 2018-01-31 DIAGNOSIS — E119 Type 2 diabetes mellitus without complications: Secondary | ICD-10-CM | POA: Diagnosis present

## 2018-01-31 DIAGNOSIS — Z7951 Long term (current) use of inhaled steroids: Secondary | ICD-10-CM | POA: Diagnosis not present

## 2018-01-31 DIAGNOSIS — G8929 Other chronic pain: Secondary | ICD-10-CM | POA: Diagnosis present

## 2018-01-31 LAB — CBC WITH DIFFERENTIAL/PLATELET
Abs Immature Granulocytes: 0.08 10*3/uL — ABNORMAL HIGH (ref 0.00–0.07)
Basophils Absolute: 0.1 10*3/uL (ref 0.0–0.1)
Basophils Relative: 1 %
Eosinophils Absolute: 0.4 10*3/uL (ref 0.0–0.5)
Eosinophils Relative: 3 %
HCT: 24.6 % — ABNORMAL LOW (ref 36.0–46.0)
Hemoglobin: 7.3 g/dL — ABNORMAL LOW (ref 12.0–15.0)
Immature Granulocytes: 1 %
Lymphocytes Relative: 16 %
Lymphs Abs: 2.4 10*3/uL (ref 0.7–4.0)
MCH: 27.3 pg (ref 26.0–34.0)
MCHC: 29.7 g/dL — ABNORMAL LOW (ref 30.0–36.0)
MCV: 92.1 fL (ref 80.0–100.0)
MONO ABS: 1.2 10*3/uL — AB (ref 0.1–1.0)
Monocytes Relative: 8 %
Neutro Abs: 10.9 10*3/uL — ABNORMAL HIGH (ref 1.7–7.7)
Neutrophils Relative %: 71 %
Platelets: 574 10*3/uL — ABNORMAL HIGH (ref 150–400)
RBC: 2.67 MIL/uL — ABNORMAL LOW (ref 3.87–5.11)
RDW: 17.3 % — ABNORMAL HIGH (ref 11.5–15.5)
WBC: 15.1 10*3/uL — ABNORMAL HIGH (ref 4.0–10.5)
nRBC: 0 % (ref 0.0–0.2)

## 2018-01-31 LAB — URINALYSIS, ROUTINE W REFLEX MICROSCOPIC
Bilirubin Urine: NEGATIVE
Glucose, UA: NEGATIVE mg/dL
Ketones, ur: NEGATIVE mg/dL
Nitrite: NEGATIVE
Protein, ur: 100 mg/dL — AB
Specific Gravity, Urine: 1.016 (ref 1.005–1.030)
WBC, UA: >50 WBC/hpf — ABNORMAL HIGH (ref 0–5)
pH: 7 (ref 5.0–8.0)

## 2018-01-31 LAB — I-STAT TROPONIN, ED: TROPONIN I, POC: 0.01 ng/mL (ref 0.00–0.08)

## 2018-01-31 LAB — BASIC METABOLIC PANEL
Anion gap: 16 — ABNORMAL HIGH (ref 5–15)
BUN: 22 mg/dL — ABNORMAL HIGH (ref 6–20)
CO2: 26 mmol/L (ref 22–32)
Calcium: 9.8 mg/dL (ref 8.9–10.3)
Chloride: 95 mmol/L — ABNORMAL LOW (ref 98–111)
Creatinine, Ser: 0.54 mg/dL (ref 0.44–1.00)
GFR calc Af Amer: >60 mL/min (ref >60)
Glucose, Bld: 189 mg/dL — ABNORMAL HIGH (ref 70–99)
Potassium: 3.2 mmol/L — ABNORMAL LOW (ref 3.5–5.1)
SODIUM: 137 mmol/L (ref 135–145)

## 2018-01-31 LAB — I-STAT CHEM 8, ED
BUN: 19 mg/dL (ref 6–20)
CALCIUM ION: 1.18 mmol/L (ref 1.15–1.40)
CREATININE: 0.4 mg/dL — AB (ref 0.44–1.00)
Chloride: 97 mmol/L — ABNORMAL LOW (ref 98–111)
Glucose, Bld: 196 mg/dL — ABNORMAL HIGH (ref 70–99)
HCT: 24 % — ABNORMAL LOW (ref 36.0–46.0)
Hemoglobin: 8.2 g/dL — ABNORMAL LOW (ref 12.0–15.0)
Potassium: 3.4 mmol/L — ABNORMAL LOW (ref 3.5–5.1)
Sodium: 135 mmol/L (ref 135–145)
TCO2: 30 mmol/L (ref 22–32)

## 2018-01-31 LAB — I-STAT CG4 LACTIC ACID, ED
Lactic Acid, Venous: 1.14 mmol/L (ref 0.5–1.9)
Lactic Acid, Venous: 1.02 mmol/L (ref 0.5–1.9)

## 2018-01-31 MED ORDER — OXYCODONE HCL 5 MG PO CAPS: 5.0000 mg | ORAL_CAPSULE | ORAL | Status: DC | PRN

## 2018-01-31 MED ORDER — POLYETHYLENE GLYCOL 3350 17 G PO PACK
17.0000 g | PACK | Freq: Every day | ORAL | Status: DC
  Administered 2018-02-02: 17 g via JEJUNOSTOMY
  Filled 2018-01-31: qty 1

## 2018-01-31 MED ORDER — FAMOTIDINE 20 MG PO TABS
20.0000 mg | ORAL_TABLET | Freq: Two times a day (BID) | ORAL | Status: DC
  Administered 2018-02-01 – 2018-02-03 (×5): 20 mg
  Filled 2018-01-31 (×5): qty 1

## 2018-01-31 MED ORDER — INSULIN ASPART 100 UNIT/ML ~~LOC~~ SOLN: 0.0000 [IU] | Freq: Three times a day (TID) | SUBCUTANEOUS | Status: DC

## 2018-01-31 MED ORDER — FENTANYL 25 MCG/HR TD PT72
25.0000 ug | MEDICATED_PATCH | TRANSDERMAL | Status: DC
  Administered 2018-02-03: 1 via TRANSDERMAL
  Filled 2018-01-31: qty 1

## 2018-01-31 MED ORDER — METOCLOPRAMIDE HCL 5 MG PO TABS: 5.0000 mg | ORAL_TABLET | Freq: Four times a day (QID) | ORAL | Status: DC | PRN

## 2018-01-31 MED ORDER — PROMOD PO LIQD: 30.0000 mL | Freq: Every day | ORAL | Status: DC

## 2018-01-31 MED ORDER — SODIUM CHLORIDE 0.9 % IV SOLN: 1.0000 g | Freq: Two times a day (BID) | INTRAVENOUS | Status: DC

## 2018-01-31 MED ORDER — SODIUM CHLORIDE 0.9 % IV SOLN
INTRAVENOUS | Status: AC
  Administered 2018-02-01: 01:00:00 via INTRAVENOUS

## 2018-01-31 MED ORDER — BACLOFEN 10 MG PO TABS
10.0000 mg | ORAL_TABLET | Freq: Three times a day (TID) | ORAL | Status: DC
  Administered 2018-02-01 – 2018-02-03 (×7): 10 mg
  Filled 2018-01-31 (×7): qty 1

## 2018-01-31 MED ORDER — MIDODRINE HCL 5 MG PO TABS
10.0000 mg | ORAL_TABLET | Freq: Three times a day (TID) | ORAL | Status: DC
  Administered 2018-02-01 – 2018-02-03 (×7): 10 mg via JEJUNOSTOMY
  Filled 2018-01-31 (×7): qty 2

## 2018-01-31 MED ORDER — POTASSIUM CHLORIDE 20 MEQ PO PACK
40.0000 meq | PACK | Freq: Once | ORAL | Status: AC
  Administered 2018-02-01: 40 meq via JEJUNOSTOMY
  Filled 2018-01-31: qty 2

## 2018-01-31 MED ORDER — IPRATROPIUM-ALBUTEROL 0.5-2.5 (3) MG/3ML IN SOLN: 3.0000 mL | RESPIRATORY_TRACT | Status: DC | PRN

## 2018-01-31 MED ORDER — OXYCODONE HCL 5 MG PO TABS
5.0000 mg | ORAL_TABLET | Freq: Once | ORAL | Status: AC
  Administered 2018-01-31: 5 mg via ORAL
  Filled 2018-01-31: qty 1

## 2018-01-31 MED ORDER — GABAPENTIN 300 MG PO CAPS: 300.0000 mg | ORAL_CAPSULE | Freq: Three times a day (TID) | ORAL | Status: DC

## 2018-01-31 MED ORDER — SODIUM CHLORIDE 0.9 % IV BOLUS
1000.0000 mL | Freq: Once | INTRAVENOUS | Status: AC
  Administered 2018-01-31: 1000 mL via INTRAVENOUS

## 2018-01-31 MED ORDER — FUROSEMIDE 40 MG PO TABS: 40.0000 mg | ORAL_TABLET | Freq: Every day | ORAL | Status: DC

## 2018-01-31 MED ORDER — MELATONIN 3 MG PO TABS
2.0000 | ORAL_TABLET | Freq: Every day | ORAL | Status: DC
  Administered 2018-02-01 – 2018-02-02 (×2): 6 mg via ORAL
  Filled 2018-01-31 (×3): qty 2

## 2018-01-31 MED ORDER — HEPARIN SODIUM (PORCINE) 5000 UNIT/ML IJ SOLN
5000.0000 [IU] | Freq: Three times a day (TID) | INTRAMUSCULAR | Status: DC
  Administered 2018-02-01 – 2018-02-03 (×6): 5000 [IU] via SUBCUTANEOUS
  Filled 2018-01-31 (×6): qty 1

## 2018-01-31 MED ORDER — SODIUM CHLORIDE 0.9 % IV SOLN: INTRAVENOUS | Status: DC

## 2018-01-31 MED ORDER — OXYCODONE HCL 5 MG PO TABS: 5.0000 mg | ORAL_TABLET | ORAL | Status: DC | PRN

## 2018-01-31 MED ORDER — FUROSEMIDE 40 MG PO TABS: 20.0000 mg | ORAL_TABLET | Freq: Every day | ORAL | Status: DC

## 2018-01-31 MED ORDER — GABAPENTIN 250 MG/5ML PO SOLN
300.0000 mg | Freq: Three times a day (TID) | ORAL | Status: DC
  Administered 2018-02-01 – 2018-02-03 (×7): 300 mg
  Filled 2018-01-31 (×10): qty 6

## 2018-01-31 MED ORDER — INSULIN GLARGINE 100 UNIT/ML ~~LOC~~ SOLN
25.0000 [IU] | Freq: Two times a day (BID) | SUBCUTANEOUS | Status: DC
  Filled 2018-01-31: qty 0.25

## 2018-01-31 MED ORDER — NICOTINE 14 MG/24HR TD PT24
14.0000 mg | MEDICATED_PATCH | Freq: Every day | TRANSDERMAL | Status: DC
  Administered 2018-02-02 – 2018-02-03 (×2): 14 mg via TRANSDERMAL
  Filled 2018-01-31 (×2): qty 1

## 2018-01-31 MED ORDER — VANCOMYCIN HCL IN DEXTROSE 1-5 GM/200ML-% IV SOLN
1000.0000 mg | Freq: Once | INTRAVENOUS | Status: AC
  Administered 2018-01-31: 1000 mg via INTRAVENOUS
  Filled 2018-01-31: qty 200

## 2018-01-31 MED ORDER — ACETAMINOPHEN 160 MG/5ML PO SOLN
650.0000 mg | Freq: Once | ORAL | Status: AC
  Administered 2018-01-31: 650 mg via ORAL
  Filled 2018-01-31: qty 20.3

## 2018-01-31 MED ORDER — LORAZEPAM 0.5 MG PO TABS
0.5000 mg | ORAL_TABLET | Freq: Three times a day (TID) | ORAL | Status: DC | PRN
  Administered 2018-02-01 – 2018-02-03 (×7): 0.5 mg
  Filled 2018-01-31 (×7): qty 1

## 2018-01-31 MED ORDER — SODIUM CHLORIDE 0.9 % IV SOLN
1.0000 g | Freq: Three times a day (TID) | INTRAVENOUS | Status: DC
  Filled 2018-01-31 (×2): qty 1

## 2018-01-31 MED ORDER — OSMOLITE 1.2 CAL PO LIQD
1000.0000 mL | ORAL | Status: DC
  Administered 2018-02-01: 1000 mL

## 2018-01-31 MED ORDER — INSULIN GLARGINE 100 UNIT/ML ~~LOC~~ SOLN
20.0000 [IU] | Freq: Every day | SUBCUTANEOUS | Status: DC
  Administered 2018-02-01 – 2018-02-02 (×3): 20 [IU] via SUBCUTANEOUS
  Filled 2018-01-31 (×4): qty 0.2

## 2018-01-31 NOTE — ED Triage Notes (Signed)
Pt is presented from [facility] by medics who were initially called for shortness of breath, pt is trache dependent and often requires deep sanctioning, reportedly on long term antibiotics after recent hospitalization and the medics expressed a high index of suspicion on pt meeting sepsis criteria based on temp of >100F and RR 24.

## 2018-01-31 NOTE — ED Provider Notes (Addendum)
[facility] DEPT Provider Note   CSN: [id number] Arrival date & time: 01/31/18  1707     History   Chief Complaint Chief Complaint  Patient presents with  . Shortness of Breath    HPI Angela Page is a 48 y.o. female.  The history is provided by the patient and medical records. No language interpreter was used.  Shortness of Breath  Associated symptoms: chest pain    Angela Page is a 48 y.o. female quadriplegic with trach who presents to the Emergency Department complaining of shortness of breath.  Patient states that she began feeling short of breath 2 days ago where she came to the emergency department and a mucous plug was taken out of her trach.  She felt much better and was sent home.  She states that she began feeling short of breath again last night which became much worse this morning.  She feels as if her lungs are full of fluid.  She endorses associated central chest pain described as a tightness.  Pain does not radiate.  Given albuterol by EMS in route and does note some improvement in her breathing.  Denies any known fever.  Per MAR at bedside, patient has been on vancomycin for "pneumonia/UTI" and took her last dose yesterday.  Past Medical History:  Diagnosis Date  . Acute on chronic respiratory failure with hypoxia ([place])   . Anasarca 06/10/2016  . Asthma   . At high risk for severe sepsis   . Cocaine use 10/08/2013  . Decubitus ulcer of buttock, unstageable ([place])   . Depression   . GERD (gastroesophageal reflux disease)   . HCAP (healthcare-associated pneumonia) 06/09/2016  . Hepatitis    hx of hepatits frm mono   . Kidney stone   . Lobar pneumonia ([place])   . Overdose of opiate or related narcotic ([place]) 09/02/2014  . Pacemaker   . Pleurisy   . Polysubstance dependence including opioid type drug, episodic abuse ([place]) 10/27/2013  . Protein calorie malnutrition ([place]) 10/31/2015  . Quadriparesis ([place])   . Quadriplegia  and quadriparesis ([place])   . Quadriplegia, C5-C7 incomplete ([place]) 10/08/2013  . Stage IV pressure ulcer of sacral region ([place]) 10/31/2015  . Tracheostomy status [facility])     Patient Active Problem List   Diagnosis Date Noted  . Sepsis ([place]) 01/31/2018  . Acute on chronic respiratory failure with hypoxia ([place])   . Quadriplegia and quadriparesis ([place])   . Lobar pneumonia ([place])   . At high risk for severe sepsis   . Tracheostomy status ([place])   . Decubitus ulcer of buttock, unstageable ([place])   . Hyperglycemic hyperosmolar nonketotic coma ([place]) 12/16/2017  . Lactic acid acidosis 12/16/2017  . AKI (acute kidney injury) ([place]) 12/16/2017  . Suprapubic catheter ([place]) 12/16/2017  . [place] (hyperglycemic hyperosmolar nonketotic coma) ([place]) 12/16/2017  . Elevated lactic acid level   . Pressure injury of skin 07/17/2017  . Kidney stone 07/16/2017  . Right ureteral stone 03/10/2017  . UTI (urinary tract infection) 03/09/2017  . Ineffective airway clearance   . Sepsis secondary to UTI ([place]) 06/30/2016  . Autonomic neuropathy 06/10/2016  . Presence of permanent cardiac pacemaker 06/10/2016  . 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 Chronically on opiate therapy 06/10/2016  . Indwelling Foley catheter present 06/10/2016  . PEG (percutaneous endoscopic gastrostomy) status ([place]) 06/10/2016  . Pulmonary hypertension ([place]) 06/10/2016  . Hypoalbuminemia 06/10/2016  . Abnormal transaminases   . Peripheral edema   . Tracheostomy  dependence ([place])   . Chronic obstructive pulmonary disease ([place])   . Chronic diastolic CHF (congestive heart failure) ([place]) 01/23/2016  . Tracheostomy status ([place])   . Esophageal dysphagia   . Malnutrition of moderate degree 11/01/2015  . Tobacco abuse 10/31/2015  . Asthma 10/31/2015  . History of pneumonia 10/08/2015  . Pressure ulcer of contiguous region involving buttock and hip, stage 4 ([place]) 02/26/2015  . Hypotension 02/25/2015  . Leukocytosis 10/23/2014    . Hypokalemia 10/23/2014  . Decubitus ulcer of sacral region, stage 4 ([place]) 10/23/2014  . Anemia, iron deficiency 10/23/2014  . Quadriplegia, C5-C7 incomplete ([place]) 10/22/2014  . Depression   . Protein-calorie malnutrition, severe ([place]) 09/05/2014  . 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 Recurrent UTI 03/31/2014  . Pressure ulcer of coccygeal region, stage 4 ([place]) 01/06/2014  . Stage 4 skin ulcer of sacral region ([place]) 01/06/2014  . Neuropathic pain 11/30/2013  . Paraplegia following spinal cord injury ([place]) 11/30/2013  . Spinal cord injury, cervical region ([place]) 11/30/2013  . Chronic pain due to injury 11/30/2013  . S/P cervical spinal fusion 10/27/2013  . Tracheostomy care ([place]) 10/27/2013  . 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 SCI (spinal cord injury) 10/11/2013    Past Surgical History:  Procedure Laterality Date  . APPENDECTOMY    . BACK SURGERY  10/08/2013   fusion of spine, cervical region  . CARDIAC SURGERY    . CYSTOSCOPY W/ URETERAL STENT PLACEMENT Right 03/09/2017   Procedure: CYSTOSCOPY [name] STENT PLACEMENT;  Surgeon: Irine Seal, MD;  Location: [facility];  Service: Urology;  Laterality: Right;  . CYSTOSCOPY W/ URETERAL STENT REMOVAL Right 07/16/2017   Procedure: CYSTOSCOPY WITH STENT REMOVAL;  Surgeon: Franchot Gallo, MD;  Location: [facility];  Service: Urology;  Laterality: Right;  . CYSTOSCOPY/URETEROSCOPY/HOLMIUM LASER/STENT PLACEMENT Right 07/16/2017   Procedure: CYSTOSCOPY/URETEROSCOPY/HOLMIUM LASER/STENT PLACEMENT/ALSO STONE EXTRACTION;  Surgeon: Franchot Gallo, MD;  Location: [facility];  Service: Urology;  Laterality: Right;  . GASTROSTOMY  11/23/2015  . I&D EXTREMITY  10/28/2011   Procedure: IRRIGATION AND DEBRIDEMENT EXTREMITY;  Surgeon: Tennis Must, MD;  Location: [facility];  Service: Orthopedics;  Laterality: Right;  Irrigation and debridement right middle finger  . INSERTION OF SUPRAPUBIC CATHETER N/A 07/28/2016    Procedure: INSERTION OF SUPRAPUBIC CATHETER;  Surgeon: Franchot Gallo, MD;  Location: [facility];  Service: Urology;  Laterality: N/A;  . IR GENERIC HISTORICAL  11/23/2015   IR GASTROSTOMY TUBE MOD SED 11/23/2015 [facility]  . IR PATIENT EVAL TECH 0-60 MINS  12/18/2017  . IR REPLACE G-TUBE SIMPLE WO FLUORO  12/19/2017  . LITHOTRIPSY  08/2017  . TRACHEOSTOMY       OB History   No obstetric history on file.      Home Medications    Prior to Admission medications   Medication Sig Start Date End Date Taking? Authorizing Provider  baclofen (LIORESAL) 10 MG tablet Place 1 tablet (10 mg total) into feeding tube 3 (three) times daily. 12/31/17  Yes Arrien, Jimmy Picket, MD  Enteral Nutrition Supplies (COMPAT ENTERAL FEEDING PUMP) MISC 40 mLs by Does not apply route every hour. Continuous Glucerna through G-tube   Yes [provider]  famotidine (PEPCID) 20 MG tablet Take 20 mg by mouth 2 (two) times daily. Via G-tube   Yes [provider]  fentaNYL (DURAGESIC - DOSED MCG/HR) 25 MCG/HR patch Place 25 mcg onto the skin every 3 (three) days.   Yes [provider]  furosemide (LASIX) 40  MG tablet Place 1 tablet (40 mg total) into feeding tube daily. 01/01/18 01/31/18 Yes Arrien, Jimmy Picket, MD  gabapentin (NEURONTIN) 300 MG capsule Take 300 mg by mouth 3 (three) times daily. Via G-tube   Yes [provider]  guaiFENesin (MUCINEX) 600 MG 12 hr tablet Take 600 mg by mouth 2 (two) times daily.   Yes [provider]  guaiFENesin (ROBITUSSIN) 100 MG/5ML liquid Take 20 mLs by mouth 3 (three) times daily as needed for cough.   Yes [provider]  Heparin Sodium, Porcine, PF 5000 UNIT/ML SOLN Inject 5,000 Units as directed every 8 (eight) hours. Inject 5000 unit subcutaneously every 8 hours for clotting prevention   Yes [provider]  Insulin Glargine (BASAGLAR KWIKPEN) 100 UNIT/ML SOPN Inject 33 Units into the skin 2 (two) times  daily.   Yes [provider]  insulin lispro (HUMALOG KWIKPEN) 100 UNIT/ML KwikPen Inject 2-10 Units into the skin every 6 (six) hours. 0-150 0 units 151-200 2 units 201-250 4 units 251-300 6 units 301-350 8 units 351-400 10 units Call MD if Bp lower than 70 or greater than 400   Yes [provider]  LORazepam (ATIVAN) 0.5 MG tablet Take 0.5 mg by mouth every 8 (eight) hours as needed for anxiety. Via G-tube   Yes [provider]  Melatonin 3 MG TABS Take 2 tablets by mouth at bedtime.   Yes [provider]  metoCLOPramide (REGLAN) 5 MG tablet Take 5 mg by mouth every 6 (six) hours as needed (GERD). Via G-tube   Yes [provider]  nicotine (NICODERM CQ - DOSED IN MG/24 HOURS) 14 mg/24hr patch Place 14 mg onto the skin daily.   Yes [provider]  Nutritional Supplements (PROMOD PO) Take 30 mLs by mouth daily.   Yes [provider]  oxycodone (OXY-IR) 5 MG capsule Take 5 mg by mouth every 4 (four) hours as needed for pain. Via G-tube   Yes [provider]  polyethylene glycol (MIRALAX / GLYCOLAX) packet Take 17 g by mouth daily. Hold if diarrhea. 01/01/18  Yes Arrien, Jimmy Picket, MD  UNABLE TO FIND Take 10 mg by mouth every 8 (eight) hours. Midodrine HCl 10mg  tablet   Yes [provider]  acetaminophen (TYLENOL) 160 MG/5ML solution Place 20.3 mLs (650 mg total) into feeding tube every 6 (six) hours as needed for mild pain, headache or fever. Patient not taking: Reported on 01/29/2018 12/31/17   Arrien, Jimmy Picket, MD  ALPRAZolam Duanne Moron) 0.25 MG tablet Place 1 tablet (0.25 mg total) into feeding tube 3 (three) times daily as needed for anxiety. Patient not taking: Reported on 01/29/2018 12/31/17   Arrien, Jimmy Picket, MD  arformoterol [facility]) 15 MCG/2ML NEBU Take 2 mLs (15 mcg total) by nebulization 2 (two) times daily. Patient not taking: Reported on 01/29/2018 12/31/17 01/30/18  Arrien, Jimmy Picket, MD  budesonide (PULMICORT) 0.5 MG/2ML nebulizer solution Take 2 mLs (0.5 mg total) by nebulization 2 (two) times daily. Patient not taking: Reported on 01/29/2018 12/31/17 01/30/18  Arrien, Jimmy Picket, MD  famotidine (PEPCID) 40 MG/5ML suspension Place 2.5 mLs (20 mg total) into feeding tube at bedtime. Patient not taking: Reported on 01/31/2018 12/31/17   Arrien, Jimmy Picket, MD  insulin aspart (NOVOLOG) 100 UNIT/ML injection Inject 10 Units into the skin every 4 (four) hours. For glucose 150 to 200 use 1 units, for glucose 201-250 use 2 units, for 251 to 300 use 4 units, for 301 to 350 use  6 units, for 351 to 400 use 8 units, for 401 or greater use 10 units. Patient not taking: Reported on 01/29/2018 12/31/17   Arrien, Jimmy Picket, MD  insulin detemir (LEVEMIR) 100 UNIT/ML injection Inject 0.2 mLs (20 Units total) into the skin daily. Patient not taking: Reported on 01/29/2018 01/01/18 01/31/18  Arrien, Jimmy Picket, MD  ipratropium (ATROVENT) 0.02 % nebulizer solution Take 2.5 mLs (0.5 mg total) by nebulization every 6 (six) hours. Patient not taking: Reported on 01/29/2018 12/31/17 01/30/18  Arrien, Jimmy Picket, MD  lactulose (CHRONULAC) 10 GM/15ML solution Place 15 mLs (10 g total) into feeding tube 2 (two) times daily. Patient not taking: Reported on 01/29/2018 12/31/17   Arrien, Jimmy Picket, MD  levalbuterol Penne Lash) 1.25 MG/0.5ML nebulizer solution Take 1.25 mg by nebulization every 6 (six) hours. Patient not taking: Reported on 01/29/2018 12/31/17 01/30/18  Arrien, Jimmy Picket, MD  oxybutynin (DITROPAN) 5 MG tablet Place 1 tablet (5 mg total) into feeding tube every 8 (eight) hours as needed for bladder spasms. Patient not taking: Reported on 01/29/2018 12/31/17   Arrien, Jimmy Picket, MD  oxyCODONE (ROXICODONE) 5 MG/5ML solution Place 5 mLs (5 mg total) into feeding tube 3 (three) times daily. Patient not taking: Reported on 01/29/2018 12/31/17   Arrien,  Jimmy Picket, MD    Family History Family History  Problem Relation Age of Onset  . Hypertension Maternal Uncle   . Kidney failure Maternal Uncle   . Colon cancer Mother   . Lung cancer Father   . Hypertension Brother   . Kidney failure Maternal Aunt   . Kidney failure Maternal Uncle   . Colon cancer Maternal Grandfather   . Esophageal cancer Neg Hx     Social History Social History   Tobacco Use  . Smoking status: Current Every Day Smoker    Packs/day: 1.00    Years: 25.00    Pack years: 25.00    Types: Cigarettes  Substance Use Topics  . Alcohol use: Never    Frequency: Never  . Drug use: No     Allergies   Erythromycin; Nitrofurantoin monohyd macro; Oxybutynin; and Penicillins   Review of Systems Review of Systems  Respiratory: Positive for shortness of breath.   Cardiovascular: Positive for chest pain.  All other systems reviewed and are negative.    Physical Exam Updated Vital Signs BP (!) 87/54 (BP Location: Right Arm)   Pulse 88   Temp 99.9 F (37.7 C) (Oral)   Resp 18   LMP 05/27/2016 (Within Days) Comment: Irregular periods since October 2015.  SpO2 97%   Physical Exam Vitals signs and nursing note reviewed.  Constitutional:      General: She is not in acute distress.    Appearance: She is well-developed.  HENT:     Head: Normocephalic and atraumatic.  Neck:     Comments: Trach. Cardiovascular:     Rate and Rhythm: Normal rate and regular rhythm.     Heart sounds: Normal heart sounds. No murmur.  Pulmonary:     Effort: Pulmonary effort is normal. No respiratory distress.  Abdominal:     General: There is no distension.     Palpations: Abdomen is soft.     Tenderness: There is no abdominal tenderness.  Skin:    General: Skin is warm and dry.  Neurological:     Mental Status: She is alert and oriented to person, place, and time.      ED Treatments / Results  Labs (all labs  ordered are listed, but only abnormal results are  displayed) Labs Reviewed  BASIC METABOLIC PANEL - Abnormal; Notable for the following components:      Result Value   Potassium 3.2 (*)    Chloride 95 (*)    Glucose, Bld 189 (*)    BUN 22 (*)    Anion gap 16 (*)    All other components within normal limits  CBC WITH DIFFERENTIAL/PLATELET - Abnormal; Notable for the following components:   WBC 15.1 (*)    RBC 2.67 (*)    Hemoglobin 7.3 (*)    HCT 24.6 (*)    MCHC 29.7 (*)    RDW 17.3 (*)    Platelets 574 (*)    Neutro Abs 10.9 (*)    Monocytes Absolute 1.2 (*)    Abs Immature Granulocytes 0.08 (*)    All other components within normal limits  URINALYSIS, ROUTINE W REFLEX MICROSCOPIC - Abnormal; Notable for the following components:   APPearance CLOUDY (*)    Hgb urine dipstick MODERATE (*)    Protein, ur 100 (*)    Leukocytes, UA LARGE (*)    WBC, UA >50 (*)    Bacteria, UA RARE (*)    All other components within normal limits  I-STAT CHEM 8, ED - Abnormal; Notable for the following components:   Potassium 3.4 (*)    Chloride 97 (*)    Creatinine, Ser 0.40 (*)    Glucose, Bld 196 (*)    Hemoglobin 8.2 (*)    HCT 24.0 (*)    All other components within normal limits  CULTURE, BLOOD (ROUTINE X 2)  CULTURE, BLOOD (ROUTINE X 2)  URINE CULTURE  CBC WITH DIFFERENTIAL/PLATELET  CBC  CREATININE, SERUM  BASIC METABOLIC PANEL  CBC WITH DIFFERENTIAL/PLATELET  MAGNESIUM  I-STAT TROPONIN, ED  I-STAT CG4 LACTIC ACID, ED  I-STAT CG4 LACTIC ACID, ED    EKG EKG Interpretation  Date/Time:  Sunday January 31 2018 18:05:39 EST Ventricular Rate:  103 PR Interval:    QRS Duration: 74 QT Interval:  366 QTC Calculation: 480 R Axis:   37 Text Interpretation:  Sinus tachycardia Consider right atrial enlargement When compared to prior, no significant changes seen.  NO STEMI Confirmed by Antony Blackbird [phone]) on 01/31/2018 8:09:51 PM   Radiology Dg Chest Port 1 View  Result Date: 01/31/2018 CLINICAL DATA:  Shortness of breath.  EXAM: PORTABLE CHEST 1 VIEW COMPARISON:  None. FINDINGS: 1737 hours. The cardio pericardial silhouette is enlarged. There is pulmonary vascular congestion without overt pulmonary edema. Similar appearance of the bibasilar atelectasis or infiltrate. Permanent pacemaker noted. Tracheostomy tube evident. Telemetry leads overlie the chest. IMPRESSION: Stable exam. Cardiomegaly with vascular congestion and bibasilar atelectasis or infiltrate. Electronically Signed   By: Misty Stanley M.D.   On: 01/31/2018 18:47    Procedures Procedures (including critical care time)  CRITICAL CARE Performed by: Ozella Almond    Total critical care time: 35 minutes  Critical care time was exclusive of separately billable procedures and treating other patients.  Critical care was necessary to treat or prevent imminent or life-threatening deterioration.  Critical care was time spent personally by me on the following activities: development of treatment plan with patient and/or surrogate as well as nursing, discussions with consultants, evaluation of patient's response to treatment, examination of patient, obtaining history from patient or surrogate, ordering and performing treatments and interventions, ordering and review of laboratory studies, ordering and review of radiographic studies, pulse oximetry and re-evaluation of  patient's condition.    Medications Ordered in ED Medications  sodium chloride 0.9 % bolus 1,000 mL (1,000 mLs Intravenous New Bag/Given 01/31/18 2054)  oxycodone (OXY-IR) immediate release capsule 5 mg (has no administration in time range)  PROMOD LIQD 30 mL (has no administration in time range)  nicotine (NICODERM CQ - dosed in mg/24 hours) patch 14 mg (has no administration in time range)  metoCLOPramide (REGLAN) tablet 5 mg (has no administration in time range)  Melatonin TABS 6 mg (has no administration in time range)  LORazepam (ATIVAN) tablet 0.5 mg (has no administration in time  range)  gabapentin (NEURONTIN) capsule 300 mg (has no administration in time range)  fentaNYL (DURAGESIC - dosed mcg/hr) patch 25 mcg (has no administration in time range)  baclofen (LIORESAL) tablet 10 mg (has no administration in time range)  famotidine (PEPCID) tablet 20 mg (has no administration in time range)  polyethylene glycol (MIRALAX / GLYCOLAX) packet 17 g (has no administration in time range)  midodrine (PROAMATINE) tablet 10 mg (has no administration in time range)  heparin injection 5,000 Units (has no administration in time range)  ceFEPIme (MAXIPIME) 1 g in sodium chloride 0.9 % 100 mL IVPB (has no administration in time range)  furosemide (LASIX) tablet 40 mg (has no administration in time range)  potassium chloride (KLOR-CON) packet 40 mEq (has no administration in time range)  feeding supplement (OSMOLITE 1.2 CAL) liquid 1,000 mL (has no administration in time range)  insulin aspart (novoLOG) injection 0-9 Units (has no administration in time range)  insulin glargine (LANTUS) injection 25 Units (has no administration in time range)  acetaminophen (TYLENOL) solution 650 mg (650 mg Oral Given 01/31/18 1906)  oxyCODONE (Oxy IR/ROXICODONE) immediate release tablet 5 mg (5 mg Oral Given 01/31/18 1907)  vancomycin (VANCOCIN) IVPB 1000 mg/200 mL premix (1,000 mg Intravenous New Bag/Given 01/31/18 2052)     Initial Impression / Assessment and Plan / ED Course  I have reviewed the triage vital signs and the nursing notes.  Pertinent labs & imaging results that were available during my care of the patient were reviewed by me and considered in my medical decision making (see chart for details).    Angela Page is a 47 y.o. female who presents to ED for shortness of breath.  Temperature of 100.8 upon arrival.  Tachycardic.  Lactic and white count are normal.  Does have a drop in her hemoglobin from 8.6-7.3 in the last 2 days.  This certainly could be contributing to her  shortness of breath.  She does report history of needing blood transfusions, but does not know why she has anemia.  She denies any blood in her stool or obvious source of bleeding.  Chest x-ray shows bibasilar atelectasis versus infiltrate.  Given her clinical picture of that fever, tachycardia and shortness of breath, favor infiltrate.  She also got a neb treatment via EMS which improved her symptoms which leads me more towards acute process as well.  Per her MAR at bedside, patient just finished a course of vancomycin for pneumonia/UTI yesterday.  Given another dose of vancomycin in the ED today.  On repeat evaluation, blood pressure dropped to 87/54.  Fluid bolus given.  Monitoring blood pressures closely.  Hospitalist consulted who will admit.  Patient discussed with Dr. Sherry Ruffing who agrees with treatment plan.    Final Clinical Impressions(s) / ED Diagnoses   Final diagnoses:  Shortness of breath  Sepsis, due to unspecified organism, unspecified whether acute organ dysfunction  present [facility])    ED Discharge Orders    None       , Ozella Almond, PA-C 01/31/18 2154    Tegeler, Gwenyth Allegra, MD 02/01/18 0006    , Ozella Almond, PA-C 02/08/18 [phone]    Tegeler, Gwenyth Allegra, MD 02/10/18 [phone]

## 2018-01-31 NOTE — ED Notes (Signed)
Notified Jamie Ward that lab called with hgb 3.9 and wants redraw to check and see if the number is corrected new CBC and chem 8 ordered by Amie [place] nurse Rica Mote RN notified of event.

## 2018-01-31 NOTE — Progress Notes (Signed)
PHARMACY NOTE:  ANTIMICROBIAL RENAL DOSAGE ADJUSTMENT  Current antimicrobial regimen includes a mismatch between antimicrobial dosage and estimated renal function.  As per policy approved by the Pharmacy & Therapeutics and Medical Executive Committees, the antimicrobial dosage will be adjusted accordingly.  Current antimicrobial dosage:  Cefepime 1gm IV q12h  Indication: PNA  Renal Function:  Estimated Creatinine Clearance: 60.9 mL/min (A) (by C-G formula based on SCr of 0.4 mg/dL (L)). []      On intermittent HD, scheduled: []      On CRRT    Antimicrobial dosage has been changed to:  Cefepime 1 gm IV q8h   Thank you for allowing pharmacy to be a part of this patient's care.  Lynelle Doctor, [facility] 01/31/2018 9:16 PM

## 2018-01-31 NOTE — H&P (Signed)
History and Physical  Angela Page MBW:[id number] DOB: 1970-09-16 DOA: 01/31/2018 1708  Referring physician: Malena Peer ED) PCP: Default, Provider, MD  Outpatient Specialists: GI Lyndel Safe); neurology Jannifer Franklin C)  HISTORY   Chief Complaint: recurrent dyspnea, low grade fever  HPI: Angela Page is a 48 y.o. female with quadriplegia s/p trach + PEG, hx of polysubstance abuse, sacral ulcers, and recent hospital admissions for PNA presents with SOB, low grade fever. Patient had a prolonged hospitalization complicated by multifocal PNA (staph aureaus and acinetobacter) and was discharged in December 2019. More recently, patient had presented to [facility] ED with dyspnea that improved after successful suctioning of a large mucus plug from trach 2 days ago. However, since yesterday patient reports dyspnea has returned and reports feeling chest tightness with inspiration. No new increase in secretions from trach. Denies any worsening of cough. Denies fever although EMS reported temp > 100 F and tachypnea.    Review of Systems:  + pleuritic chest pain; dyspnea - no fevers/chills - no cough - no edema, PND, orthopnea - no nausea/vomiting; no tarry, melanotic or bloody stools - no dysuria, increased urinary frequency - no weight changes Rest of systems reviewed are negative, except as per above history.   ED course:  Vitals Blood pressure (!) 87/54, pulse 88, temperature 99.9 F (37.7 C), temperature source Oral, resp. rate 18, last menstrual period 05/27/2016, SpO2 97 %. Received albuterol nebs via EMS; NS bolus x 1L; vancomycin IV 1gm x 1; oxycodone 5mg  x 1; tylenol 650 mg x 1;   Past Medical History:  Diagnosis Date  . Acute on chronic respiratory failure with hypoxia ([place])   . Anasarca 06/10/2016  . Asthma   . At high risk for severe sepsis   . Cocaine use 10/08/2013  . Decubitus ulcer of buttock, unstageable ([place])   . Depression   . GERD (gastroesophageal reflux disease)   .  HCAP (healthcare-associated pneumonia) 06/09/2016  . Hepatitis    hx of hepatits frm mono   . Kidney stone   . Lobar pneumonia ([place])   . Overdose of opiate or related narcotic ([place]) 09/02/2014  . Pacemaker   . Pleurisy   . Polysubstance dependence including opioid type drug, episodic abuse ([place]) 10/27/2013  . Protein calorie malnutrition ([place]) 10/31/2015  . Quadriparesis ([place])   . Quadriplegia and quadriparesis ([place])   . Quadriplegia, C5-C7 incomplete ([place]) 10/08/2013  . Stage IV pressure ulcer of sacral region ([place]) 10/31/2015  . Tracheostomy status [facility])    Past Surgical History:  Procedure Laterality Date  . APPENDECTOMY    . BACK SURGERY  10/08/2013   fusion of spine, cervical region  . CARDIAC SURGERY    . CYSTOSCOPY W/ URETERAL STENT PLACEMENT Right 03/09/2017   Procedure: CYSTOSCOPY [name] STENT PLACEMENT;  Surgeon: Irine Seal, MD;  Location: [facility];  Service: Urology;  Laterality: Right;  . CYSTOSCOPY W/ URETERAL STENT REMOVAL Right 07/16/2017   Procedure: CYSTOSCOPY WITH STENT REMOVAL;  Surgeon: Franchot Gallo, MD;  Location: [facility];  Service: Urology;  Laterality: Right;  . CYSTOSCOPY/URETEROSCOPY/HOLMIUM LASER/STENT PLACEMENT Right 07/16/2017   Procedure: CYSTOSCOPY/URETEROSCOPY/HOLMIUM LASER/STENT PLACEMENT/ALSO STONE EXTRACTION;  Surgeon: Franchot Gallo, MD;  Location: [facility];  Service: Urology;  Laterality: Right;  . GASTROSTOMY  11/23/2015  . I&D EXTREMITY  10/28/2011   Procedure: IRRIGATION AND DEBRIDEMENT EXTREMITY;  Surgeon: Tennis Must, MD;  Location: [place];  Service: Orthopedics;  Laterality: Right;  Irrigation and debridement right middle finger  .  INSERTION OF SUPRAPUBIC CATHETER N/A 07/28/2016   Procedure: INSERTION OF SUPRAPUBIC CATHETER;  Surgeon: Franchot Gallo, MD;  Location: [facility];  Service: Urology;  Laterality: N/A;  . IR GENERIC HISTORICAL  11/23/2015   IR GASTROSTOMY TUBE MOD SED 11/23/2015 [facility]  . IR PATIENT  EVAL TECH 0-60 MINS  12/18/2017  . IR REPLACE G-TUBE SIMPLE WO FLUORO  12/19/2017  . LITHOTRIPSY  08/2017  . TRACHEOSTOMY      Social History:  reports that she has been smoking cigarettes. She has a 25.00 pack-year smoking history. She does not have any smokeless tobacco history on file. She reports that she does not drink alcohol or use drugs.  Allergies  Allergen Reactions  . Erythromycin Nausea And Vomiting  . Nitrofurantoin Monohyd Macro Nausea And Vomiting  . Oxybutynin     Dries mouth out   . Penicillins Hives    Has patient had a PCN reaction causing immediate rash, facial/tongue/throat swelling, SOB or lightheadedness with hypotension: Yes Has patient had a PCN reaction causing severe rash involving mucus membranes or skin necrosis: Yes Has patient had a PCN reaction that required hospitalization No Has patient had a PCN reaction occurring within the last 10 years: No-childhood allergy If all of the above answers are "NO", then may proceed with Cephalosporin     Family History  Problem Relation Age of Onset  . Hypertension Maternal Uncle   . Kidney failure Maternal Uncle   . Colon cancer Mother   . Lung cancer Father   . Hypertension Brother   . Kidney failure Maternal Aunt   . Kidney failure Maternal Uncle   . Colon cancer Maternal Grandfather   . Esophageal cancer Neg Hx       Prior to Admission medications   Medication Sig Start Date End Date Taking? Authorizing Provider  baclofen (LIORESAL) 10 MG tablet Place 1 tablet (10 mg total) into feeding tube 3 (three) times daily. 12/31/17  Yes Arrien, Jimmy Picket, MD  Enteral Nutrition Supplies (COMPAT ENTERAL FEEDING PUMP) MISC 40 mLs by Does not apply route every hour. Continuous Glucerna through G-tube   Yes [provider]  famotidine (PEPCID) 20 MG tablet Take 20 mg by mouth 2 (two) times daily. Via G-tube   Yes [provider]  fentaNYL (DURAGESIC - DOSED MCG/HR) 25 MCG/HR patch Place 25 mcg  onto the skin every 3 (three) days.   Yes [provider]  furosemide (LASIX) 40 MG tablet Place 1 tablet (40 mg total) into feeding tube daily. 01/01/18 01/31/18 Yes Arrien, Jimmy Picket, MD  gabapentin (NEURONTIN) 300 MG capsule Take 300 mg by mouth 3 (three) times daily. Via G-tube   Yes [provider]  guaiFENesin (MUCINEX) 600 MG 12 hr tablet Take 600 mg by mouth 2 (two) times daily.   Yes [provider]  guaiFENesin (ROBITUSSIN) 100 MG/5ML liquid Take 20 mLs by mouth 3 (three) times daily as needed for cough.   Yes [provider]  Heparin Sodium, Porcine, PF 5000 UNIT/ML SOLN Inject 5,000 Units as directed every 8 (eight) hours. Inject 5000 unit subcutaneously every 8 hours for clotting prevention   Yes [provider]  Insulin Glargine (BASAGLAR KWIKPEN) 100 UNIT/ML SOPN Inject 33 Units into the skin 2 (two) times daily.   Yes [provider]  insulin lispro (HUMALOG KWIKPEN) 100 UNIT/ML KwikPen Inject 2-10 Units into the skin every 6 (six) hours. 0-150 0 units 151-200 2 units 201-250 4 units 251-300 6 units 301-350 8  units 351-400 10 units Call MD if Bp lower than 70 or greater than 400   Yes [provider]  LORazepam (ATIVAN) 0.5 MG tablet Take 0.5 mg by mouth every 8 (eight) hours as needed for anxiety. Via G-tube   Yes [provider]  Melatonin 3 MG TABS Take 2 tablets by mouth at bedtime.   Yes [provider]  metoCLOPramide (REGLAN) 5 MG tablet Take 5 mg by mouth every 6 (six) hours as needed (GERD). Via G-tube   Yes [provider]  nicotine (NICODERM CQ - DOSED IN MG/24 HOURS) 14 mg/24hr patch Place 14 mg onto the skin daily.   Yes [provider]  Nutritional Supplements (PROMOD PO) Take 30 mLs by mouth daily.   Yes [provider]  oxycodone (OXY-IR) 5 MG capsule Take 5 mg by mouth every 4 (four) hours as needed for pain. Via G-tube   Yes [provider]    polyethylene glycol (MIRALAX / GLYCOLAX) packet Take 17 g by mouth daily. Hold if diarrhea. 01/01/18  Yes Arrien, Jimmy Picket, MD  UNABLE TO FIND Take 10 mg by mouth every 8 (eight) hours. Midodrine HCl 10mg  tablet   Yes [provider]  acetaminophen (TYLENOL) 160 MG/5ML solution Place 20.3 mLs (650 mg total) into feeding tube every 6 (six) hours as needed for mild pain, headache or fever. Patient not taking: Reported on 01/29/2018 12/31/17   Arrien, Jimmy Picket, MD  ALPRAZolam Duanne Moron) 0.25 MG tablet Place 1 tablet (0.25 mg total) into feeding tube 3 (three) times daily as needed for anxiety. Patient not taking: Reported on 01/29/2018 12/31/17   Arrien, Jimmy Picket, MD  arformoterol [facility]) 15 MCG/2ML NEBU Take 2 mLs (15 mcg total) by nebulization 2 (two) times daily. Patient not taking: Reported on 01/29/2018 12/31/17 01/30/18  Arrien, Jimmy Picket, MD  budesonide (PULMICORT) 0.5 MG/2ML nebulizer solution Take 2 mLs (0.5 mg total) by nebulization 2 (two) times daily. Patient not taking: Reported on 01/29/2018 12/31/17 01/30/18  Arrien, Jimmy Picket, MD  famotidine (PEPCID) 40 MG/5ML suspension Place 2.5 mLs (20 mg total) into feeding tube at bedtime. Patient not taking: Reported on 01/31/2018 12/31/17   Arrien, Jimmy Picket, MD  insulin aspart (NOVOLOG) 100 UNIT/ML injection Inject 10 Units into the skin every 4 (four) hours. For glucose 150 to 200 use 1 units, for glucose 201-250 use 2 units, for 251 to 300 use 4 units, for 301 to 350 use 6 units, for 351 to 400 use 8 units, for 401 or greater use 10 units. Patient not taking: Reported on 01/29/2018 12/31/17   Arrien, Jimmy Picket, MD  insulin detemir (LEVEMIR) 100 UNIT/ML injection Inject 0.2 mLs (20 Units total) into the skin daily. Patient not taking: Reported on 01/29/2018 01/01/18 01/31/18  Arrien, Jimmy Picket, MD  ipratropium (ATROVENT) 0.02 % nebulizer solution Take 2.5 mLs (0.5 mg total) by nebulization every  6 (six) hours. Patient not taking: Reported on 01/29/2018 12/31/17 01/30/18  Arrien, Jimmy Picket, MD  lactulose (CHRONULAC) 10 GM/15ML solution Place 15 mLs (10 g total) into feeding tube 2 (two) times daily. Patient not taking: Reported on 01/29/2018 12/31/17   Arrien, Jimmy Picket, MD  levalbuterol Penne Lash) 1.25 MG/0.5ML nebulizer solution Take 1.25 mg by nebulization every 6 (six) hours. Patient not taking: Reported on 01/29/2018 12/31/17 01/30/18  Arrien, Jimmy Picket, MD  oxybutynin (DITROPAN) 5 MG tablet Place 1 tablet (5 mg total) into feeding tube every 8 (eight) hours as needed for bladder spasms. Patient not  taking: Reported on 01/29/2018 12/31/17   Arrien, Jimmy Picket, MD  oxyCODONE (ROXICODONE) 5 MG/5ML solution Place 5 mLs (5 mg total) into feeding tube 3 (three) times daily. Patient not taking: Reported on 01/29/2018 12/31/17   Arrien, Jimmy Picket, MD    PHYSICAL EXAM   Temp:  [phone] F (37.3 C)-99.9 F (37.7 C)] 99.9 F (37.7 C) (01/12 2050) Pulse Rate:  [88-112] 88 (01/12 2050) Resp:  [18-27] 18 (01/12 2050) BP: (87-111)/(54-67) 87/54 (01/12 2050) SpO2:  [95 %-100 %] 97 % (01/12 2050) FiO2 (%):  [28 %] 28 % (01/12 1947)  BP (!) 87/54 (BP Location: Right Arm)   Pulse 88   Temp 99.9 F (37.7 C) (Oral)   Resp 18   LMP 05/27/2016 (Within Days) Comment: Irregular periods since October 2015.  SpO2 97%    GEN well-nourished middle-aged caucasian female; resting in bed. Patient is able to verbalize fairly well with trach in place, although sometimes difficult to understand  HEENT NCAT EOM intact PERRL; clear oropharynx, no cervical LAD; dry mucus membranes  Trach and collar in place; no visible secretions JVP estimated 5 cm H2O above RA; no HJR ; no carotid bruits b/l ;  CV regular normal rate; normal S1 and S2; 2/6 holosystolic murmur loudest at USB and LLSB;no gallops or rubs; PMI non displaced; no parasternal heave  RESP trach in place; decreased breath  sounds at bases with faint bibasilar crackles; no wheezing; symmetric  ABD soft NT ND +normoactive BS  EXT warm throughout b/l; no peripheral edema b/l  PULSES  DP and radials 2+ intact b/l  SKIN/MSK  Reported sacral ulcers (did not directly visualize) NEURO/PSYCH AAOx4; b/l upper extremity strength 3/5; no strength in lower extremities b/l; did not perform full neuro exam    DATA   LABS ON ADMISSION:  Basic Metabolic Panel: Recent Labs  Lab 01/27/18 0733 01/29/18 1549 01/31/18 1737 01/31/18 1930  NA 137 137 137 135  K 4.2 4.2 3.2* 3.4*  CL 94* 93* 95* 97*  CO2 32 30 26  --   GLUCOSE 178* 144* 189* 196*  BUN 23* 33* 22* 19  CREATININE 0.55 0.56 0.54 0.40*  CALCIUM 10.5* 10.2 9.8  --   MG 2.1  --   --   --   PHOS 4.0  --   --   --    CBC: Recent Labs  Lab 01/27/18 0733 01/29/18 1549 01/31/18 1923 01/31/18 1930  WBC 14.6* 16.9* 15.1*  --   NEUTROABS  --  13.6* 10.9*  --   HGB 8.0* 8.6* 7.3* 8.2*  HCT 26.7* 29.3* 24.6* 24.0*  MCV 94.7 92.4 92.1  --   PLT 628* 667* 574*  --    Liver Function Tests: Recent Labs  Lab 01/29/18 1549  AST 26  ALT 23  ALKPHOS 163*  BILITOT 0.6  PROT 8.6*  ALBUMIN 3.4*   No results for input(s): LIPASE, AMYLASE in the last 168 hours. No results for input(s): AMMONIA in the last 168 hours. Coagulation:  Lab Results  Component Value Date   INR 1.04 03/10/2017   INR 0.92 06/23/2016   INR 1.01 06/10/2016   No results found for: PTT Lactic Acid, Venous:     Component Value Date/Time   LATICACIDVEN 1.02 01/31/2018 1930   Cardiac Enzymes: No results for input(s): CKTOTAL, CKMB, CKMBINDEX, TROPONINI in the last 168 hours. Urinalysis:    Component Value Date/Time   COLORURINE YELLOW 01/31/2018 2102   APPEARANCEUR CLOUDY (A) 01/31/2018 2102  LABSPEC 1.016 01/31/2018 2102   PHURINE 7.0 01/31/2018 2102   GLUCOSEU NEGATIVE 01/31/2018 2102   GLUCOSEU NEGATIVE 04/28/2017 1121   HGBUR MODERATE (A) 01/31/2018 2102   BILIRUBINUR  NEGATIVE 01/31/2018 2102   KETONESUR NEGATIVE 01/31/2018 2102   PROTEINUR 100 (A) 01/31/2018 2102   UROBILINOGEN 0.2 04/28/2017 1121   NITRITE NEGATIVE 01/31/2018 2102   LEUKOCYTESUR LARGE (A) 01/31/2018 2102    BNP (last 3 results) Recent Labs    04/28/17 1110  PROBNP 62.0   CBG: No results for input(s): GLUCAP in the last 168 hours.  Radiological Exams on Admission: Dg Chest Port 1 View  Result Date: 01/31/2018 CLINICAL DATA:  Shortness of breath. EXAM: PORTABLE CHEST 1 VIEW COMPARISON:  None. FINDINGS: 1737 hours. The cardio pericardial silhouette is enlarged. There is pulmonary vascular congestion without overt pulmonary edema. Similar appearance of the bibasilar atelectasis or infiltrate. Permanent pacemaker noted. Tracheostomy tube evident. Telemetry leads overlie the chest. IMPRESSION: Stable exam. Cardiomegaly with vascular congestion and bibasilar atelectasis or infiltrate. Electronically Signed   By: Misty Stanley M.D.   On: 01/31/2018 18:47    EKG: Independently reviewed. Sinus tachycardia at 103 bpm; no ST changes  I have reviewed the patient's previous electronic chart records, labs, and other data.   ASSESSMENT AND PLAN   Assessment: Angela Page is a 48 y.o. female with quadriplegia s/p trach + PEG, hx of polysubstance abuse, sacral ulcers, HFpEF and recent hospital admissions for PNA presents with SOB, low grade fever. Symptoms had began when she had a suspected mucous plug that was successfully suctions 2 days prior to admission, but given return of dyspnea, pneumonia is high on the differential. Will treat as HCAP. UA is positive but suspect chronic colonization. BP slightly lower than baseline (systolics 08-657Q), likely early sepsis.     Active Problems:   Sepsis ([place])  Plan:   # Sepsis likely respiratory infection, recent mucous plug - vanc and cefepime IV (D1 1/12) - blood and urine cultures pending - sputum culture ordered with trach suction -  trach suction and care q shift - gentle fluid boluses: receiving IV fluids overnight - given hx of HFpEF/pulm edema, resume home lasix 40mg  daily tomorrow - duoneb q4h prn via trach  # Paraplegia (due to fall) s/p trach and PEG and chronic pain - trach care as above - resume home fentanyl 58mcg patch q3 days - resume home oxycodone  - resume home gabapentin and baclofen - resume home ativan and melatonin for sleep  # GERD and constipation, prior hx of tube feed intolerance - resume pepcid - re-ordered osomlite 1.2 x 10 hours in evening - resume promod supplement  - daily miralax  # Sacral decubitus ulcers - wound care - requested air mattress  # Hypokalemia K 3.4 - replete K 11meQ packet  # IDDM on conflicting insulin regimen (latest MAR from nursing home shows glargine 33 units BID, although recent inpatient regimen was 20 units levemir daily) > sugars in 190s on admission - will start with lantus 20 units nightly - confirm with nursing home [facility]) in morning   # Chronic normocytic anemia, likely ACD  - hb at baseline in 8s  - continue to monitor   # Former smoker - resume home nicotine patch   # Hx of neurogenic orthostasis  - resume home midodrine 10mg  TID   DVT Prophylaxis: heparin subq Code Status:  Full Code Family Communication: discussed plan with patient at bedside Disposition Plan: admit to stepdown  for treatment of resp infection   Patient contact: Extended Emergency Contact Information Primary Emergency Contact: Edwards,Janice Address: [address]          [address]          [place], [place] [address] [place] Phone: [phone] Mobile Phone: [phone] Relation: Mother Secondary Emergency Contact: Edwards,Scott  [place] Phone: [phone] Relation: Brother  Time spent: > 35 mins  Colbert Ewing, MD [facility] Pager [phone]  If 7PM-7AM, please contact  night-coverage [email or web address] Password [email or web address] 01/31/2018, 10:58 PM

## 2018-01-31 NOTE — ED Notes (Signed)
Bed: SU11 Expected date:  Expected time:  Means of arrival:  Comments: 72f resp distress

## 2018-01-31 NOTE — Progress Notes (Signed)
A consult was received from an ED physician for vancomycin per pharmacy dosing.  The patient's profile has been reviewed for ht/wt/allergies/indication/available labs.   A one time order has been placed for vancomycin 1gm.    Further antibiotics/pharmacy consults should be ordered by admitting physician if indicated.                       Thank you, Dolly Rias RPh 01/31/2018, 8:25 PM Pager [phone]

## 2018-02-01 ENCOUNTER — Other Ambulatory Visit: Payer: Self-pay

## 2018-02-01 ENCOUNTER — Encounter (HOSPITAL_COMMUNITY): Payer: Self-pay

## 2018-02-01 DIAGNOSIS — R0602 Shortness of breath: Secondary | ICD-10-CM

## 2018-02-01 LAB — DIFFERENTIAL
Abs Immature Granulocytes: 0.06 10*3/uL (ref 0.00–0.07)
Basophils Absolute: 0.1 10*3/uL (ref 0.0–0.1)
Basophils Relative: 1 %
Eosinophils Absolute: 0.9 10*3/uL — ABNORMAL HIGH (ref 0.0–0.5)
Eosinophils Relative: 7 %
Immature Granulocytes: 1 %
Lymphocytes Relative: 19 %
Lymphs Abs: 2.5 10*3/uL (ref 0.7–4.0)
MONO ABS: 0.9 10*3/uL (ref 0.1–1.0)
MONOS PCT: 7 %
Neutro Abs: 8.6 10*3/uL — ABNORMAL HIGH (ref 1.7–7.7)
Neutrophils Relative %: 65 %

## 2018-02-01 LAB — BASIC METABOLIC PANEL
Anion gap: 11 (ref 5–15)
BUN: 16 mg/dL (ref 6–20)
CO2: 24 mmol/L (ref 22–32)
Calcium: 8.8 mg/dL — ABNORMAL LOW (ref 8.9–10.3)
Chloride: 107 mmol/L (ref 98–111)
Creatinine, Ser: 0.44 mg/dL (ref 0.44–1.00)
GFR calc Af Amer: >60 mL/min (ref >60)
GFR calc non Af Amer: >60 mL/min (ref >60)
Glucose, Bld: 88 mg/dL (ref 70–99)
Potassium: 4.1 mmol/L (ref 3.5–5.1)
Sodium: 142 mmol/L (ref 135–145)

## 2018-02-01 LAB — GLUCOSE, CAPILLARY
Glucose-Capillary: 77 mg/dL (ref 70–99)
Glucose-Capillary: 95 mg/dL (ref 70–99)

## 2018-02-01 LAB — CBC
HCT: 24.7 % — ABNORMAL LOW (ref 36.0–46.0)
HCT: 25.6 % — ABNORMAL LOW (ref 36.0–46.0)
Hemoglobin: 7 g/dL — ABNORMAL LOW (ref 12.0–15.0)
Hemoglobin: 7.2 g/dL — ABNORMAL LOW (ref 12.0–15.0)
MCH: 27.5 pg (ref 26.0–34.0)
MCH: 27.1 pg (ref 26.0–34.0)
MCHC: 28.3 g/dL — AB (ref 30.0–36.0)
MCHC: 28.1 g/dL — ABNORMAL LOW (ref 30.0–36.0)
MCV: 96.9 fL (ref 80.0–100.0)
MCV: 96.2 fL (ref 80.0–100.0)
Platelets: 503 10*3/uL — ABNORMAL HIGH (ref 150–400)
Platelets: 525 10*3/uL — ABNORMAL HIGH (ref 150–400)
RBC: 2.55 MIL/uL — ABNORMAL LOW (ref 3.87–5.11)
RBC: 2.66 MIL/uL — ABNORMAL LOW (ref 3.87–5.11)
RDW: 17.4 % — ABNORMAL HIGH (ref 11.5–15.5)
RDW: 17.4 % — ABNORMAL HIGH (ref 11.5–15.5)
WBC: 13 10*3/uL — ABNORMAL HIGH (ref 4.0–10.5)
WBC: 13.8 10*3/uL — ABNORMAL HIGH (ref 4.0–10.5)
nRBC: 0 % (ref 0.0–0.2)
nRBC: 0 % (ref 0.0–0.2)

## 2018-02-01 LAB — MAGNESIUM: Magnesium: 2.1 mg/dL (ref 1.7–2.4)

## 2018-02-01 LAB — MRSA PCR SCREENING: MRSA by PCR: NEGATIVE

## 2018-02-01 LAB — BRAIN NATRIURETIC PEPTIDE: B Natriuretic Peptide: 86.5 pg/mL (ref 0.0–100.0)

## 2018-02-01 MED ORDER — OSMOLITE 1.2 CAL PO LIQD
1000.0000 mL | ORAL | Status: DC
  Administered 2018-02-02: 1000 mL

## 2018-02-01 MED ORDER — VANCOMYCIN HCL IN DEXTROSE 750-5 MG/150ML-% IV SOLN: 750.0000 mg | INTRAVENOUS | Status: DC

## 2018-02-01 MED ORDER — ZOLPIDEM TARTRATE 5 MG PO TABS
5.0000 mg | ORAL_TABLET | Freq: Every evening | ORAL | Status: DC | PRN
  Administered 2018-02-01: 5 mg via ORAL
  Filled 2018-02-01: qty 1

## 2018-02-01 MED ORDER — INSULIN ASPART 100 UNIT/ML ~~LOC~~ SOLN
0.0000 [IU] | Freq: Four times a day (QID) | SUBCUTANEOUS | Status: DC
  Administered 2018-02-02: 1 [IU] via SUBCUTANEOUS
  Administered 2018-02-02 – 2018-02-03 (×3): 2 [IU] via SUBCUTANEOUS
  Administered 2018-02-03: 3 [IU] via SUBCUTANEOUS

## 2018-02-01 MED ORDER — PRO-STAT SUGAR FREE PO LIQD
30.0000 mL | Freq: Every day | ORAL | Status: DC
  Administered 2018-02-01 – 2018-02-03 (×3): 30 mL
  Filled 2018-02-01 (×3): qty 30

## 2018-02-01 MED ORDER — OXYCODONE HCL 5 MG PO TABS
10.0000 mg | ORAL_TABLET | ORAL | Status: DC | PRN
  Administered 2018-02-01 – 2018-02-03 (×10): 10 mg
  Filled 2018-02-01 (×10): qty 2

## 2018-02-01 MED ORDER — FUROSEMIDE 10 MG/ML IJ SOLN
40.0000 mg | Freq: Two times a day (BID) | INTRAMUSCULAR | Status: DC
  Administered 2018-02-01 – 2018-02-03 (×3): 40 mg via INTRAVENOUS
  Filled 2018-02-01 (×4): qty 4

## 2018-02-01 MED ORDER — CHLORHEXIDINE GLUCONATE 0.12 % MT SOLN
15.0000 mL | Freq: Two times a day (BID) | OROMUCOSAL | Status: DC
  Administered 2018-02-01 – 2018-02-03 (×4): 15 mL via OROMUCOSAL
  Filled 2018-02-01 (×3): qty 15

## 2018-02-01 MED ORDER — SODIUM CHLORIDE 0.9 % IV SOLN
1.0000 g | Freq: Three times a day (TID) | INTRAVENOUS | Status: DC
  Administered 2018-02-01 – 2018-02-02 (×5): 1 g via INTRAVENOUS
  Filled 2018-02-01 (×6): qty 1

## 2018-02-01 MED ORDER — VITAMINS A & D EX OINT
TOPICAL_OINTMENT | CUTANEOUS | Status: AC
  Administered 2018-02-01: 1
  Filled 2018-02-01: qty 5

## 2018-02-01 MED ORDER — ORAL CARE MOUTH RINSE
15.0000 mL | Freq: Two times a day (BID) | OROMUCOSAL | Status: DC
  Administered 2018-02-02 – 2018-02-03 (×4): 15 mL via OROMUCOSAL

## 2018-02-01 MED ORDER — HYDROCODONE-ACETAMINOPHEN 7.5-325 MG/15ML PO SOLN
10.0000 mL | ORAL | Status: DC | PRN
  Administered 2018-02-01 (×2): 10 mL via ORAL
  Filled 2018-02-01 (×2): qty 15

## 2018-02-01 NOTE — Progress Notes (Signed)
Pt transported to ICU from ED with 28% venturi set-up, then placed on 28% ATC once in ICU room. All trach supplies brought with pt to new room. RT will continue to monitor.

## 2018-02-01 NOTE — Progress Notes (Signed)
Pharmacy Antibiotic Note  Angela Page is a 48 y.o. quadriplegic female s/p trach, PEG, recent PsA UTI and MSSA/acinetobacter PNA in Dec 2019, presented to the ED on 01/31/2018 with c/o SOB and low grade fever. To start vancomycin and meropenem for sepsis.  Plan: - Vancomycin 1000 mg IV x1 given in the ED, then 750 mg IV q24h (est AUC 406) - meropenem 1gm IV q8h  _____________________________________  Temp (24hrs), Avg:99.6 F (37.6 C), Min:99.2 F (37.3 C), Max:99.9 F (37.7 C)  Recent Labs  Lab 01/27/18 0733 01/27/18 1431 01/29/18 1549 01/31/18 1737 01/31/18 1836 01/31/18 1923 01/31/18 1930  WBC 14.6*  --  16.9*  --   --  15.1*  --   CREATININE 0.55  --  0.56 0.54  --   --  0.40*  LATICACIDVEN  --   --   --   --  1.14  --  1.02  VANCOTROUGH  --  21*  --   --   --   --   --     Estimated Creatinine Clearance: 60.9 mL/min (A) (by C-G formula based on SCr of 0.4 mg/dL (L)).    Allergies  Allergen Reactions  . Erythromycin Nausea And Vomiting  . Nitrofurantoin Monohyd Macro Nausea And Vomiting  . Oxybutynin     Dries mouth out   . Penicillins Hives    Has patient had a PCN reaction causing immediate rash, facial/tongue/throat swelling, SOB or lightheadedness with hypotension: Yes Has patient had a PCN reaction causing severe rash involving mucus membranes or skin necrosis: Yes Has patient had a PCN reaction that required hospitalization No Has patient had a PCN reaction occurring within the last 10 years: No-childhood allergy If all of the above answers are "NO", then may proceed with Cephalosporin     Thank you for allowing pharmacy to be a part of this patient's care.  Lynelle Doctor 02/01/2018 12:02 AM

## 2018-02-01 NOTE — Progress Notes (Signed)
PROGRESS NOTE    Angela Page  WGY:[id number] DOB: [birth year] DOA: 01/31/2018 PCP: Default, Provider, MD  Outpatient Specialists:   Brief Narrative: Patient is a 48 year old Caucasian female, functional quadriplegic, status post trach and PEG placement, with past medical history significant for polysubstance abuse, sacral ulcers, and recent hospital admissions for pneumonia.  Patient had a prolonged hospitalization complicated by multifocal PNA (staph aureaus and acinetobacter) and was discharged in December 2019.  2 days prior to presentation, patient presented to [facility] ED with dyspnea that improved after successful suctioning of a large mucus plug.  Patient is admitted with complaints of recurrent dyspnea and low-grade fever.  Assessment & Plan:   Active Problems:   Sepsis ([place])  Shortness of breath with low-grade fever:  Definitive cause unclear.   Possible pneumonic process  Cannot entirely rule out pulmonary edema/CHF  Continue antibiotics  Follow cultures  Check procalcitonin  Change Lasix to IV Lasix 40 mg twice daily  Further management depend on hospital course.    Possible sepsis likely respiratory infection, recent mucous plug - vanc and meropenem  - blood and urine cultures pending - sputum culture ordered with trach suction - trach suction and care q shift -See above.  # Paraplegia (due to fall) s/p trach and PEG and chronic pain - trach care as above - resume home fentanyl 37mcg patch q3 days - resume home oxycodone  - resume home gabapentin and baclofen - resume home ativan and melatonin for sleep  # GERD and constipation, prior hx of tube feed intolerance -Continue current regimen.  # Sacral decubitus ulcers - wound care - requested air mattress  # Hypokalemia K 3.4 - repleted.  Potassium today is 4.1.   # IDDM: Continue to optimize.    # Chronic normocytic anemia             -Repeat CBC stat.  Last hemoglobin was 7 g/dL  # Former  smoker - resume home nicotine patch   # Hx of orthostasis             -Continue 10mg  TID   DVT Prophylaxis: heparin subq Code Status:  Full Code Family Communication: d Disposition Plan: This will depend on hospital course.   Consultants:   None  Procedures:   None  Antimicrobials:   IV vancomycin  IV meropenem   Subjective: No new complaints. Difficult getting history from patient as patient is trached.  Objective: Vitals:   02/01/18 1600 02/01/18 1700 02/01/18 1800 02/01/18 1900  BP: 100/67 105/67 96/66 (!) 107/59  Pulse: 95 90 87 87  Resp: (!) 27     Temp:      TempSrc:      SpO2: 99% 99% 100% 98%    Intake/Output Summary (Last 24 hours) at 02/01/2018 1913 Last data filed at 02/01/2018 1816 Gross per 24 hour  Intake 1522.05 ml  Output -  Net 1522.05 ml   There were no vitals filed for this visit.  Examination:  General exam: Appears calm and comfortable.  Status post trach and PEG placement Respiratory system: Clear to auscultation.  Cardiovascular system: S1 & S2 heard Gastrointestinal system: Abdomen is nondistended, soft and nontender. No organomegaly or masses felt. Normal bowel sounds heard. Central nervous system: Alert and oriented.  Paraplegic.    Data Reviewed: I have personally reviewed following labs and imaging studies  CBC: Recent Labs  Lab 01/27/18 0733 01/29/18 1549 01/31/18 1923 01/31/18 1930 02/01/18 1657  WBC 14.6* 16.9* 15.1*  --  13.0*  NEUTROABS  --  13.6* 10.9*  --  8.6*  HGB 8.0* 8.6* 7.3* 8.2* 7.0*  HCT 26.7* 29.3* 24.6* 24.0* 24.7*  MCV 94.7 92.4 92.1  --  96.9  PLT 628* 667* 574*  --  308*   Basic Metabolic Panel: Recent Labs  Lab 01/27/18 0733 01/29/18 1549 01/31/18 1737 01/31/18 1930 02/01/18 0452 02/01/18 1657  NA 137 137 137 135 QUESTIONABLE RESULTS, RECOMMEND RECOLLECT TO VERIFY 142  K 4.2 4.2 3.2* 3.4* QUESTIONABLE RESULTS, RECOMMEND RECOLLECT TO VERIFY 4.1  CL 94* 93* 95* 97* QUESTIONABLE RESULTS,  RECOMMEND RECOLLECT TO VERIFY 107  CO2 32 30 26  --  QUESTIONABLE RESULTS, RECOMMEND RECOLLECT TO VERIFY 24  GLUCOSE 178* 144* 189* 196* QUESTIONABLE RESULTS, RECOMMEND RECOLLECT TO VERIFY 88  BUN 23* 33* 22* 19 QUESTIONABLE RESULTS, RECOMMEND RECOLLECT TO VERIFY 16  CREATININE 0.55 0.56 0.54 0.40* QUESTIONABLE RESULTS, RECOMMEND RECOLLECT TO VERIFY 0.44  CALCIUM 10.5* 10.2 9.8  --  QUESTIONABLE RESULTS, RECOMMEND RECOLLECT TO VERIFY 8.8*  MG 2.1  --   --   --  QUESTIONABLE RESULTS, RECOMMEND RECOLLECT TO VERIFY 2.1  PHOS 4.0  --   --   --   --   --    GFR: Estimated Creatinine Clearance: 60.9 mL/min (by C-G formula based on SCr of 0.44 mg/dL). Liver Function Tests: Recent Labs  Lab 01/29/18 1549  AST 26  ALT 23  ALKPHOS 163*  BILITOT 0.6  PROT 8.6*  ALBUMIN 3.4*   No results for input(s): LIPASE, AMYLASE in the last 168 hours. No results for input(s): AMMONIA in the last 168 hours. Coagulation Profile: No results for input(s): INR, PROTIME in the last 168 hours. Cardiac Enzymes: No results for input(s): CKTOTAL, CKMB, CKMBINDEX, TROPONINI in the last 168 hours. BNP (last 3 results) Recent Labs    04/28/17 1110  PROBNP 62.0   HbA1C: No results for input(s): HGBA1C in the last 72 hours. CBG: Recent Labs  Lab 02/01/18 1720  GLUCAP 77   Lipid Profile: No results for input(s): CHOL, HDL, LDLCALC, TRIG, CHOLHDL, LDLDIRECT in the last 72 hours. Thyroid Function Tests: No results for input(s): TSH, T4TOTAL, FREET4, T3FREE, THYROIDAB in the last 72 hours. Anemia Panel: No results for input(s): VITAMINB12, FOLATE, FERRITIN, TIBC, IRON, RETICCTPCT in the last 72 hours. Urine analysis:    Component Value Date/Time   COLORURINE YELLOW 01/31/2018 2102   APPEARANCEUR CLOUDY (A) 01/31/2018 2102   LABSPEC 1.016 01/31/2018 2102   PHURINE 7.0 01/31/2018 2102   GLUCOSEU NEGATIVE 01/31/2018 2102   GLUCOSEU NEGATIVE 04/28/2017 1121   HGBUR MODERATE (A) 01/31/2018 2102    BILIRUBINUR NEGATIVE 01/31/2018 2102   KETONESUR NEGATIVE 01/31/2018 2102   PROTEINUR 100 (A) 01/31/2018 2102   UROBILINOGEN 0.2 04/28/2017 1121   NITRITE NEGATIVE 01/31/2018 2102   LEUKOCYTESUR LARGE (A) 01/31/2018 2102   Sepsis Labs: @LABRCNTIP (procalcitonin:4,lacticidven:4)  ) Recent Results (from the past 240 hour(s))  Culture, blood (routine x 2)     Status: None (Preliminary result)   Collection Time: 01/31/18  5:47 PM  Result Value Ref Range Status   Specimen Description   Final    BLOOD RIGHT HAND Performed at [facility], [place] [address] [place], [place] [address]    Special Requests   Final    BOTTLES DRAWN AEROBIC AND ANAEROBIC Blood Culture adequate volume Performed at [place] [address] [place],  [address]    Culture   Final    NO GROWTH < 12  HOURS Performed at [facility], [place] [address], [place], [place] [address]    Report Status PENDING  Incomplete  Culture, respiratory (non-expectorated)     Status: None (Preliminary result)   Collection Time: 02/01/18 12:15 AM  Result Value Ref Range Status   Specimen Description   Final    TRACHEAL ASPIRATE Performed at [place] [address], [place], [place] [address]    Special Requests   Final    Normal Performed at [facility], [place] [address], [place], [place] [address]    Gram Stain   Final    FEW WBC PRESENT,BOTH PMN AND MONONUCLEAR NO ORGANISMS SEEN Performed at [facility], [place] [address] [place], [place] [address]    Culture PENDING  Incomplete   Report Status PENDING  Incomplete         Radiology Studies: Dg Chest Port 1 View  Result Date: 01/31/2018 CLINICAL DATA:  Shortness of breath. EXAM: PORTABLE CHEST 1 VIEW COMPARISON:  None. FINDINGS: 1737 hours. The cardio pericardial silhouette is enlarged. There is pulmonary vascular congestion without overt pulmonary edema. Similar  appearance of the bibasilar atelectasis or infiltrate. Permanent pacemaker noted. Tracheostomy tube evident. Telemetry leads overlie the chest. IMPRESSION: Stable exam. Cardiomegaly with vascular congestion and bibasilar atelectasis or infiltrate. Electronically Signed   By: Misty Stanley M.D.   On: 01/31/2018 18:47        Scheduled Meds: . vitamin A & D      . baclofen  10 mg Per Tube TID  . famotidine  20 mg Per Tube BID  . feeding supplement (OSMOLITE 1.2 CAL)  1,000 mL Per Tube Q24H  . feeding supplement (PRO-STAT SUGAR FREE 64)  30 mL Per Tube Daily  . [START ON 02/03/2018] fentaNYL  25 mcg Transdermal Q72H  . furosemide  40 mg Per Tube Daily  . gabapentin  300 mg Per Tube Q8H  . heparin  5,000 Units Subcutaneous Q8H  . insulin aspart  0-9 Units Subcutaneous Q8H  . insulin glargine  20 Units Subcutaneous QHS  . Melatonin  2 tablet Oral QHS  . midodrine  10 mg Per J Tube TID WC  . nicotine  14 mg Transdermal Daily  . polyethylene glycol  17 g Per J Tube Daily   Continuous Infusions: . meropenem (MERREM) IV Stopped (02/01/18 1859)  . [START ON 02/02/2018] vancomycin       LOS: 1 day    Time spent: 35 minutes    Dana Allan, MD  [facility] Pager #: [phone] 7PM-7AM contact night coverage as above

## 2018-02-01 NOTE — ED Notes (Signed)
Paged lab for re-collect of blood work.

## 2018-02-01 NOTE — ED Notes (Signed)
Paged hospitalist to order pain medication via IV or liquid for peg tube.

## 2018-02-01 NOTE — ED Notes (Signed)
ED TO INPATIENT HANDOFF REPORT  Name/Age/Gender Angela Page 48 y.o. female  Code Status    Code Status Orders  (From admission, onward)         Start     Ordered   01/31/18 2115  Full code  Continuous     01/31/18 2114        Code Status History    Date Active Date Inactive Code Status Order ID Comments User Context   12/31/2017 2312 01/28/2018 1951 Full Code [id number]  Gordan Payment, CNA Inpatient   12/16/2017 0518 12/31/2017 2241 Full Code [id number]  Ivor Costa, MD ED   07/16/2017 1358 07/17/2017 1715 Full Code [id number]  Franchot Gallo, MD Inpatient   03/09/2017 1133 03/12/2017 1655 Full Code [id number]  Damita Lack, MD ED   06/30/2016 0838 07/03/2016 1645 Full Code [id number]  Reubin Milan, MD ED   06/23/2016 2326 06/27/2016 1733 Full Code [id number]  Vianne Bulls, MD ED   06/09/2016 1720 06/13/2016 1845 Full Code [id number]  Lovenia Kim, MD ED   01/19/2016 2314 01/23/2016 2106 Full Code [id number]  Toy Baker, MD Inpatient   11/24/2015 2230 11/27/2015 2056 Full Code [id number]  Corrie Mckusick, DO Inpatient   10/31/2015 2215 11/24/2015 2230 Full Code [id number]  Toy Baker, MD Inpatient   09/22/2015 1902 09/27/2015 1742 Full Code [id number]  Theodis Blaze, MD Inpatient   02/25/2015 2050 03/01/2015 1621 Full Code [id number]  Elgergawy, Silver Huguenin, MD Inpatient   01/31/2015 2218 02/03/2015 1741 Full Code [id number]  Theressa Millard, MD Inpatient   10/29/2014 1859 10/30/2014 1658 Full Code [id number]  Janece Canterbury, MD Inpatient   10/22/2014 2230 10/26/2014 1752 Full Code [id number]  Theressa Millard, MD Inpatient   09/02/2014 0328 09/06/2014 1526 Full Code [id number]  Etta Quill, DO ED   08/02/2014 0340 08/04/2014 2006 Full Code [id number]  Rise Patience, MD Inpatient    Advance Directive Documentation     Most Recent Value  Type of Advance Directive  Healthcare Power of Attorney, Living will  Pre-existing out of facility DNR order (yellow form or pink  MOST form)  -  "MOST" Form in Place?  -      Home/SNF/Other Nursing Home  Chief Complaint Breathing Difficulty  Level of Care/Admitting Diagnosis ED Disposition    ED Disposition Condition [place]: [facility] [[id number]]  Level of Care: Stepdown [14]  Admit to SDU based on following criteria: Hemodynamic compromise or significant risk of instability:  Patient requiring short term acute titration and management of vasoactive drips, and invasive monitoring (i.e., CVP and Arterial line).  Diagnosis: Sepsis [facility]) [[id number]]  Admitting Physician: Colbert Ewing [[id number]]  Attending Physician: Colbert Ewing [[id number]]  Estimated length of stay: past midnight tomorrow  Certification:: I certify this patient will need inpatient services for at least 2 midnights  PT Class (Do Not Modify): Inpatient [101]  PT Acc Code (Do Not Modify): Private [1]       Medical History Past Medical History:  Diagnosis Date  . Acute on chronic respiratory failure with hypoxia ([place])   . Anasarca 06/10/2016  . Asthma   . At high risk for severe sepsis   . Cocaine use 10/08/2013  . Decubitus ulcer of buttock, unstageable ([place])   . Depression   . GERD (gastroesophageal reflux disease)   . HCAP (healthcare-associated pneumonia) 06/09/2016  . Hepatitis    hx of hepatits frm mono   . Kidney  stone   . Lobar pneumonia ([place])   . Overdose of opiate or related narcotic ([place]) 09/02/2014  . Pacemaker   . Pleurisy   . Polysubstance dependence including opioid type drug, episodic abuse ([place]) 10/27/2013  . Protein calorie malnutrition ([place]) 10/31/2015  . Quadriparesis ([place])   . Quadriplegia and quadriparesis ([place] )   . Quadriplegia, C5-C7 incomplete ([place]) 10/08/2013  . Stage IV pressure ulcer of sacral region ([place]) 10/31/2015  . Tracheostomy status (H[facility])     Allergies Allergies  Allergen Reactions  . Erythromycin Nausea And Vomiting  . Nitrofurantoin Monohyd Macro  Nausea And Vomiting  . Oxybutynin     Dries mouth out   . Penicillins Hives    Has patient had a PCN reaction causing immediate rash, facial/tongue/throat swelling, SOB or lightheadedness with hypotension: Yes Has patient had a PCN reaction causing severe rash involving mucus membranes or skin necrosis: Yes Has patient had a PCN reaction that required hospitalization No Has patient had a PCN reaction occurring within the last 10 years: No-childhood allergy If all of the above answers are "NO", then may proceed with Cephalosporin     IV Location/Drains/Wounds Patient Lines/Drains/Airways Status   Active Line/Drains/Airways    Name:   Placement date:   Placement time:   Site:   Days:   Peripheral IV 12/16/17 Left;Anterior Forearm   12/16/17    1659    Forearm   47   Peripheral IV 12/18/17 Left;Upper Arm   12/18/17    2216    Arm   45   Gastrostomy/Enterostomy Gastrostomy 20 Fr. LUQ   12/19/17    1131    LUQ   44   Rectal Tube/Pouch   12/18/17    1011    -   45   Suprapubic Catheter Cuffed Suprapubic 20 Fr.   12/24/17    1800    Cuffed Suprapubic   39   Ureteral Drain/Stent Right ureter 6 Fr.   07/16/17    1119    Right ureter   200   Tracheostomy [place] 6 mm Cuffed   12/17/17    0230    6 mm   46   Tracheostomy [place] 6 mm Uncuffed   01/29/18    1511    6 mm   3   Pressure Injury 06/09/16 Stage IV - Full thickness tissue loss with exposed bone, tendon or muscle. almost completely healed; pinhole sized open wound in center remains when assessed on 6/5   06/09/16    1906     602   Pressure Injury Unstageable - Full thickness tissue loss in which the base of the ulcer is covered by slough (yellow, tan, gray, green or brown) and/or eschar (tan, brown or black) in the wound bed. healed old wound   -    -        Pressure Injury Unstageable - Full thickness tissue loss in which the base of the ulcer is covered by slough (yellow, tan, gray, green or brown) and/or eschar (tan, brown or black) in the  wound bed. old wound   -    -        Pressure Injury 12/20/17 Stage II -  Partial thickness loss of dermis presenting as a shallow open ulcer with a red, pink wound bed without slough.   12/20/17    0800     43   Pressure Injury 12/20/17 Stage II -  Partial thickness loss of dermis presenting as a shallow  open ulcer with a red, pink wound bed without slough.   12/20/17    0800     43   Pressure Injury 12/20/17 Stage II -  Partial thickness loss of dermis presenting as a shallow open ulcer with a red, pink wound bed without slough.   12/20/17    0800     43          Labs/Imaging Results for orders placed or performed during the hospital encounter of 01/31/18 (from the past 48 hour(s))  Basic metabolic panel     Status: Abnormal   Collection Time: 01/31/18  5:37 PM  Result Value Ref Range   Sodium 137 135 - 145 mmol/L   Potassium 3.2 (L) 3.5 - 5.1 mmol/L   Chloride 95 (L) 98 - 111 mmol/L   CO2 26 22 - 32 mmol/L   Glucose, Bld 189 (H) 70 - 99 mg/dL   BUN 22 (H) 6 - 20 mg/dL   Creatinine, Ser 0.54 0.44 - 1.00 mg/dL   Calcium 9.8 8.9 - 10.3 mg/dL   GFR calc non Af Amer >60 >60 mL/min   GFR calc Af Amer >60 >60 mL/min   Anion gap 16 (H) 5 - 15    Comment: Performed at [facility], [place] [address] [place], [place] [address]  Culture, blood (routine x 2)     Status: None (Preliminary result)   Collection Time: 01/31/18  5:47 PM  Result Value Ref Range   Specimen Description      BLOOD RIGHT HAND Performed at [facility] [address] [place], [place] [address]    Special Requests      BOTTLES DRAWN AEROBIC AND ANAEROBIC Blood Culture adequate volume Performed at [facility] [address] [place], [place] [address]    Culture      NO GROWTH < 12 HOURS Performed at [facility] [address], [place], [place] [address]    Report Status PENDING   I-stat troponin, ED     Status: None   Collection Time: 01/31/18   6:33 PM  Result Value Ref Range   Troponin i, poc 0.01 0.00 - 0.08 ng/mL   Comment 3            Comment: Due to the release kinetics of cTnI, a negative result within the first hours of the onset of symptoms does not rule out myocardial infarction with certainty. If myocardial infarction is still suspected, repeat the test at appropriate intervals.   I-Stat CG4 Lactic Acid, ED     Status: None   Collection Time: 01/31/18  6:36 PM  Result Value Ref Range   Lactic Acid, Venous 1.14 0.5 - 1.9 mmol/L  CBC with Differential     Status: Abnormal   Collection Time: 01/31/18  7:23 PM  Result Value Ref Range   WBC 15.1 (H) 4.0 - 10.5 K/uL   RBC 2.67 (L) 3.87 - 5.11 MIL/uL   Hemoglobin 7.3 (L) 12.0 - 15.0 g/dL    Comment: REPEATED TO VERIFY DELTA CHECK NOTED    HCT 24.6 (L) 36.0 - 46.0 %   MCV 92.1 80.0 - 100.0 fL   MCH 27.3 26.0 - 34.0 pg   MCHC 29.7 (L) 30.0 - 36.0 g/dL   RDW 17.3 (H) 11.5 - 15.5 %   Platelets 574 (H) 150 - 400 K/uL   nRBC 0.0 0.0 - 0.2 %   Neutrophils Relative % 71 %  Neutro Abs 10.9 (H) 1.7 - 7.7 K/uL   Lymphocytes Relative 16 %   Lymphs Abs 2.4 0.7 - 4.0 K/uL   Monocytes Relative 8 %   Monocytes Absolute 1.2 (H) 0.1 - 1.0 K/uL   Eosinophils Relative 3 %   Eosinophils Absolute 0.4 0.0 - 0.5 K/uL   Basophils Relative 1 %   Basophils Absolute 0.1 0.0 - 0.1 K/uL   Immature Granulocytes 1 %   Abs Immature Granulocytes 0.08 (H) 0.00 - 0.07 K/uL    Comment: Performed at [facility], [place] [address], [place], [place] [address]  I-stat Chem 8, ED     Status: Abnormal   Collection Time: 01/31/18  7:30 PM  Result Value Ref Range   Sodium 135 135 - 145 mmol/L   Potassium 3.4 (L) 3.5 - 5.1 mmol/L   Chloride 97 (L) 98 - 111 mmol/L   BUN 19 6 - 20 mg/dL   Creatinine, Ser 0.40 (L) 0.44 - 1.00 mg/dL   Glucose, Bld 196 (H) 70 - 99 mg/dL   Calcium, Ion 1.18 1.15 - 1.40 mmol/L   TCO2 30 22 - 32 mmol/L   Hemoglobin 8.2 (L) 12.0 - 15.0 g/dL   HCT 24.0  (L) 36.0 - 46.0 %  I-Stat CG4 Lactic Acid, ED     Status: None   Collection Time: 01/31/18  7:30 PM  Result Value Ref Range   Lactic Acid, Venous 1.02 0.5 - 1.9 mmol/L  Urinalysis, Routine w reflex microscopic     Status: Abnormal   Collection Time: 01/31/18  9:02 PM  Result Value Ref Range   Color, Urine YELLOW YELLOW   APPearance CLOUDY (A) CLEAR   Specific Gravity, Urine 1.016 1.005 - 1.030   pH 7.0 5.0 - 8.0   Glucose, UA NEGATIVE NEGATIVE mg/dL   Hgb urine dipstick MODERATE (A) NEGATIVE   Bilirubin Urine NEGATIVE NEGATIVE   Ketones, ur NEGATIVE NEGATIVE mg/dL   Protein, ur 100 (A) NEGATIVE mg/dL   Nitrite NEGATIVE NEGATIVE   Leukocytes, UA LARGE (A) NEGATIVE   RBC / HPF 21-50 0 - 5 RBC/hpf   WBC, UA >50 (H) 0 - 5 WBC/hpf   Bacteria, UA RARE (A) NONE SEEN   Squamous Epithelial / LPF 0-5 0 - 5   Mucus PRESENT     Comment: Performed at [facility], [place] [address] [place], [place] [address]  Culture, respiratory (non-expectorated)     Status: None (Preliminary result)   Collection Time: 02/01/18 12:15 AM  Result Value Ref Range   Specimen Description      TRACHEAL ASPIRATE Performed at [facility] [address], [place], [place] [address]    Special Requests      Normal Performed at [facility], [place] [address] [place], [place] [address]    Gram Stain      FEW WBC PRESENT,BOTH PMN AND MONONUCLEAR NO ORGANISMS SEEN Performed at [facility], [place] [address], [place], [place] [address]    Culture PENDING    Report Status PENDING   Basic metabolic panel     Status: None   Collection Time: 02/01/18  4:52 AM  Result Value Ref Range   Sodium  135 - 145 mmol/L    QUESTIONABLE RESULTS, RECOMMEND RECOLLECT TO VERIFY    Comment: CORRECTED ON 01/13 AT 0600: PREVIOUSLY REPORTED AS 140   Potassium  3.5 - 5.1 mmol/L    QUESTIONABLE RESULTS, RECOMMEND RECOLLECT TO VERIFY  Comment: CORRECTED ON 01/13 AT 0600:  PREVIOUSLY REPORTED AS 4.0   Chloride  98 - 111 mmol/L    QUESTIONABLE RESULTS, RECOMMEND RECOLLECT TO VERIFY    Comment: CORRECTED ON 01/13 AT 0600: PREVIOUSLY REPORTED AS 106   CO2  22 - 32 mmol/L    QUESTIONABLE RESULTS, RECOMMEND RECOLLECT TO VERIFY    Comment: CORRECTED ON 01/13 AT 0600: PREVIOUSLY REPORTED AS 24   Glucose, Bld  70 - 99 mg/dL    QUESTIONABLE RESULTS, RECOMMEND RECOLLECT TO VERIFY    Comment: CORRECTED ON 01/13 AT 0600: PREVIOUSLY REPORTED AS 80   BUN  6 - 20 mg/dL    QUESTIONABLE RESULTS, RECOMMEND RECOLLECT TO VERIFY    Comment: CORRECTED ON 01/13 AT 0600: PREVIOUSLY REPORTED AS 19   Creatinine, Ser  0.44 - 1.00 mg/dL    QUESTIONABLE RESULTS, RECOMMEND RECOLLECT TO VERIFY    Comment: CORRECTED ON 01/13 AT 0600: PREVIOUSLY REPORTED AS 0.43   Calcium  8.9 - 10.3 mg/dL    QUESTIONABLE RESULTS, RECOMMEND RECOLLECT TO VERIFY    Comment: CORRECTED ON 01/13 AT 0600: PREVIOUSLY REPORTED AS 8.5   GFR calc non Af Amer  >60 mL/min    QUESTIONABLE RESULTS, RECOMMEND RECOLLECT TO VERIFY    Comment: CORRECTED ON 01/13 AT 0600: PREVIOUSLY REPORTED AS >60   GFR calc Af Amer  >60 mL/min    QUESTIONABLE RESULTS, RECOMMEND RECOLLECT TO VERIFY    Comment: CORRECTED ON 01/13 AT 0600: PREVIOUSLY REPORTED AS >60   Anion gap  5 - 15    QUESTIONABLE RESULTS, RECOMMEND RECOLLECT TO VERIFY    Comment: Performed at [facility], [place] [address] [place], [place] [address] CORRECTED ON 01/13 AT 0600: PREVIOUSLY REPORTED AS 10   Magnesium     Status: None   Collection Time: 02/01/18  4:52 AM  Result Value Ref Range   Magnesium  1.7 - 2.4 mg/dL    QUESTIONABLE RESULTS, RECOMMEND RECOLLECT TO VERIFY    Comment: Performed at [facility], [place] [address], [place], [place] [address] CORRECTED ON 01/13 AT 0600: PREVIOUSLY REPORTED AS 2.0    Dg Chest Port 1 View  Result Date: 01/31/2018 CLINICAL DATA:  Shortness of breath. EXAM: PORTABLE CHEST 1 VIEW  COMPARISON:  None. FINDINGS: 1737 hours. The cardio pericardial silhouette is enlarged. There is pulmonary vascular congestion without overt pulmonary edema. Similar appearance of the bibasilar atelectasis or infiltrate. Permanent pacemaker noted. Tracheostomy tube evident. Telemetry leads overlie the chest. IMPRESSION: Stable exam. Cardiomegaly with vascular congestion and bibasilar atelectasis or infiltrate. Electronically Signed   By: Misty Stanley M.D.   On: 01/31/2018 18:47   EKG Interpretation  Date/Time:  Sunday January 31 2018 18:05:39 EST Ventricular Rate:  103 PR Interval:    QRS Duration: 74 QT Interval:  366 QTC Calculation: 480 R Axis:   37 Text Interpretation:  Sinus tachycardia Consider right atrial enlargement When compared to prior, no significant changes seen.  NO STEMI Confirmed by Tegeler, Chris ([id number]) on 01/31/2018 8:09:51 PM   Pending Labs Unresulted Labs (From admission, onward)    Start     Ordered   02/01/18 1144  MRSA PCR Screening  Once,   R     02/01/18 1143   02/01/18 0615  CBC  Once,   STAT     02/01/18 0615   02/01/18 0615  Differential  Once,   STAT     01 /13/20 0615   02/01/18 7628  Basic metabolic panel  Once,   R     02/01/18 0615   02/01/18 0615  Magnesium  Once,   R     02/01/18 0615   02/01/18 3151  Basic metabolic panel  Daily,   R     01/31/18 2124   02/01/18 0500  CBC with Differential/Platelet  Daily,   R     01/31/18 2124   02/01/18 0500  Magnesium  Daily,   R     01/31/18 2124   01/31/18 2114  CBC  (heparin)  Once,   R    Comments:  Baseline for heparin therapy IF NOT ALREADY DRAWN.  Notify MD if PLT < 100 K.    01/31/18 2114   01/31/18 2114  Creatinine, serum  (heparin)  Once,   R    Comments:  Baseline for heparin therapy IF NOT ALREADY DRAWN.    01/31/18 2114   01/31/18 2017  Urine culture  ONCE - STAT,   STAT     01/31/18 2016   01/31/18 1747  Culture, blood (routine x 2)  BLOOD CULTURE X 2,   STAT     01/31/18 1746    01/31/18 1737  CBC with Differential  Once,   STAT     01/31/18 1737          Vitals/Pain Today's Vitals   02/01/18 1301 02/01/18 1430 02/01/18 1500 02/01/18 1530  BP: 134/85 112/71 112/66 113/69  Pulse: (!) 105 87 92 92  Resp: 19 20 (!) 21 (!) 29  Temp:      TempSrc:      SpO2: 98% 100% 98%   PainSc:        Isolation Precautions No active isolations  Medications Medications  PROMOD LIQD 30 mL (has no administration in time range)  nicotine (NICODERM CQ - dosed in mg/24 hours) patch 14 mg (has no administration in time range)  metoCLOPramide (REGLAN) tablet 5 mg (has no administration in time range)  Melatonin TABS 6 mg (6 mg Oral Not Given 02/01/18 0035)  LORazepam (ATIVAN) tablet 0.5 mg (0.5 mg Per Tube Given 02/01/18 1301)  fentaNYL (DURAGESIC - dosed mcg/hr) patch 25 mcg (has no administration in time range)  baclofen (LIORESAL) tablet 10 mg (10 mg Per Tube Not Given 02/01/18 1318)  famotidine (PEPCID) tablet 20 mg (20 mg Per Tube Not Given 02/01/18 1318)  polyethylene glycol (MIRALAX / GLYCOLAX) packet 17 g (has no administration in time range)  midodrine (PROAMATINE) tablet 10 mg (10 mg Per J Tube Not Given 02/01/18 1319)  heparin injection 5,000 Units (5,000 Units Subcutaneous Not Given 02/01/18 0720)  furosemide (LASIX) tablet 40 mg (40 mg Per Tube Not Given 02/01/18 1318)  feeding supplement (OSMOLITE 1.2 CAL) liquid 1,000 mL (1,000 mLs Per Tube Not Given 02/01/18 0720)  insulin aspart (novoLOG) injection 0-9 Units (has no administration in time range)  0.9 %  sodium chloride infusion ( Intravenous New Bag/Given 02/01/18 0032)  insulin glargine (LANTUS) injection 20 Units (20 Units Subcutaneous Given 02/01/18 0034)  ipratropium-albuterol (DUONEB) 0.5-2.5 (3) MG/3ML nebulizer solution 3 mL (has no administration in time range)  gabapentin (NEURONTIN) 250 MG/5ML solution 300 mg (300 mg Per Tube Not Given 02/01/18 0720)  vancomycin (VANCOCIN) IVPB 750 mg/150 ml premix (has no  administration in time range)  zolpidem (AMBIEN) tablet 5 mg (5 mg Oral Given 02/01/18 0037)  meropenem (MERREM) 1 g in sodium chloride 0.9 % 100 mL IVPB (0 g Intravenous Stopped 02/01/18 1155)  HYDROcodone-acetaminophen (HYCET) 7.5-325 mg/15 ml  solution 10 mL (10 mLs Oral Given 02/01/18 1301)  acetaminophen (TYLENOL) solution 650 mg (650 mg Oral Given 01/31/18 1906)  oxyCODONE (Oxy IR/ROXICODONE) immediate release tablet 5 mg (5 mg Oral Given 01/31/18 1907)  vancomycin (VANCOCIN) IVPB 1000 mg/200 mL premix (0 mg Intravenous Stopped 01/31/18 2152)  sodium chloride 0.9 % bolus 1,000 mL (0 mLs Intravenous Stopped 01/31/18 2155)  potassium chloride (KLOR-CON) packet 40 mEq (40 mEq Per J Tube Given 02/01/18 0030)    Mobility non-ambulatory

## 2018-02-02 LAB — RENAL FUNCTION PANEL
Albumin: 2.7 g/dL — ABNORMAL LOW (ref 3.5–5.0)
Anion gap: 10 (ref 5–15)
BUN: 18 mg/dL (ref 6–20)
CO2: 25 mmol/L (ref 22–32)
Calcium: 8.9 mg/dL (ref 8.9–10.3)
Chloride: 107 mmol/L (ref 98–111)
Creatinine, Ser: 0.45 mg/dL (ref 0.44–1.00)
GFR calc Af Amer: >60 mL/min (ref >60)
GFR calc non Af Amer: >60 mL/min (ref >60)
Glucose, Bld: 100 mg/dL — ABNORMAL HIGH (ref 70–99)
Phosphorus: 3.9 mg/dL (ref 2.5–4.6)
Potassium: 3.6 mmol/L (ref 3.5–5.1)
Sodium: 142 mmol/L (ref 135–145)

## 2018-02-02 LAB — CBC WITH DIFFERENTIAL/PLATELET
Abs Immature Granulocytes: 0.04 10*3/uL (ref 0.00–0.07)
Basophils Absolute: 0.1 10*3/uL (ref 0.0–0.1)
Basophils Relative: 1 %
EOS PCT: 11 %
Eosinophils Absolute: 1.3 10*3/uL — ABNORMAL HIGH (ref 0.0–0.5)
HCT: 25 % — ABNORMAL LOW (ref 36.0–46.0)
Hemoglobin: 7.1 g/dL — ABNORMAL LOW (ref 12.0–15.0)
Immature Granulocytes: 0 %
Lymphocytes Relative: 23 %
Lymphs Abs: 2.7 10*3/uL (ref 0.7–4.0)
MCH: 26.7 pg (ref 26.0–34.0)
MCHC: 28.4 g/dL — ABNORMAL LOW (ref 30.0–36.0)
MCV: 94 fL (ref 80.0–100.0)
Monocytes Absolute: 1 10*3/uL (ref 0.1–1.0)
Monocytes Relative: 9 %
Neutro Abs: 6.4 10*3/uL (ref 1.7–7.7)
Neutrophils Relative %: 56 %
PLATELETS: 537 10*3/uL — AB (ref 150–400)
RBC: 2.66 MIL/uL — ABNORMAL LOW (ref 3.87–5.11)
RDW: 17.4 % — ABNORMAL HIGH (ref 11.5–15.5)
WBC: 11.5 10*3/uL — ABNORMAL HIGH (ref 4.0–10.5)
nRBC: 0 % (ref 0.0–0.2)

## 2018-02-02 LAB — GLUCOSE, CAPILLARY
Glucose-Capillary: 113 mg/dL — ABNORMAL HIGH (ref 70–99)
Glucose-Capillary: 125 mg/dL — ABNORMAL HIGH (ref 70–99)
Glucose-Capillary: 144 mg/dL — ABNORMAL HIGH (ref 70–99)
Glucose-Capillary: 180 mg/dL — ABNORMAL HIGH (ref 70–99)

## 2018-02-02 LAB — BASIC METABOLIC PANEL
Anion gap: 9 (ref 5–15)
BUN: 18 mg/dL (ref 6–20)
CO2: 25 mmol/L (ref 22–32)
CREATININE: 0.4 mg/dL — AB (ref 0.44–1.00)
Calcium: 9 mg/dL (ref 8.9–10.3)
Chloride: 107 mmol/L (ref 98–111)
GFR calc Af Amer: >60 mL/min (ref >60)
GFR calc non Af Amer: >60 mL/min (ref >60)
Glucose, Bld: 104 mg/dL — ABNORMAL HIGH (ref 70–99)
Potassium: 3.6 mmol/L (ref 3.5–5.1)
Sodium: 141 mmol/L (ref 135–145)

## 2018-02-02 LAB — MAGNESIUM: Magnesium: 2 mg/dL (ref 1.7–2.4)

## 2018-02-02 LAB — URINE CULTURE

## 2018-02-02 LAB — HEMOGLOBIN AND HEMATOCRIT, BLOOD
HCT: 33.3 % — ABNORMAL LOW (ref 36.0–46.0)
Hemoglobin: 9.8 g/dL — ABNORMAL LOW (ref 12.0–15.0)

## 2018-02-02 LAB — PREPARE RBC (CROSSMATCH)

## 2018-02-02 LAB — PROCALCITONIN: Procalcitonin: 0.43 ng/mL

## 2018-02-02 MED ORDER — SODIUM CHLORIDE 0.9% IV SOLUTION
Freq: Once | INTRAVENOUS | Status: AC
  Administered 2018-02-02: 14:00:00 via INTRAVENOUS

## 2018-02-02 MED ORDER — SODIUM CHLORIDE 0.9 % IV SOLN
2.0000 g | Freq: Three times a day (TID) | INTRAVENOUS | Status: DC
  Administered 2018-02-02 – 2018-02-03 (×3): 2 g via INTRAVENOUS
  Filled 2018-02-02 (×6): qty 2

## 2018-02-02 NOTE — Consult Note (Signed)
[facility] Nurse wound consult note Reason for Consult:Healed stage 3 pressure injury to sacrococcygeal area with nonintact center, measuring 2 cm x 3 cm x o.2 cm surrounded by scar tissue.  Left ischium:  1 cm x 1 cm x0.5 cm stage 3 Right ischium:  2 cm x 1.5 cm x 0.2 cm  Wound type:stage  3 nonhealing pressure injury Pressure Injury POA: Yes Measurement:see above Wound YUW:CNPS, slick pale nongranulating Drainage (amount, consistency, odor) moderate serosanguinous  No odor Periwound:scarring Dressing procedure/placement/frequency: Cleanse wounds to sacrum and bilateral ischial tuberosities with NS.  Fill with Aquacel Ag and cover with ABD pad and tape.  Change daily and PRN soilage.  Will not follow at this time.  Please re-consult if needed.  Domenic Moras MSN, RN, FNP-BC CWON Wound, Ostomy, Continence Nurse Pager [phone]

## 2018-02-02 NOTE — Progress Notes (Signed)
02/02/2018  1825 Notified IV team to come assess patient's midline IV. Pt's midline IV leaked a lot of blood on patient's gown and bed pad. Notified MD that 2nd unit did not complete infusing. Per MD check H & H. Then they will make a decision on another unit of blood.

## 2018-02-02 NOTE — Progress Notes (Signed)
Pharmacy Antibiotic Note  Angela Page is a 48 y.o. quadriplegic female s/p trach, PEG, recent PsA UTI and MSSA/acinetobacter PNA in Dec 2019, presented to the ED on 01/31/2018 with c/o SOB and low grade fever. To start vancomycin and meropenem for sepsis >> now changed to Cefepime alone.  Afebrile PCT 0.43 WBC decreasing    Plan: - Cefepime 2g IV q8h - Follow up renal fxn, culture results, and clinical course.  _____________________________________  Temp (24hrs), Avg:98.2 F (36.8 C), Min:97.6 F (36.4 C), Max:98.4 F (36.9 C)  Recent Labs  Lab 01/27/18 1431 01/29/18 1549 01/31/18 1737 01/31/18 1836 01/31/18 1923 01/31/18 1930 02/01/18 0452 02/01/18 1657 02/01/18 2014 02/02/18 0259  WBC  --  16.9*  --   --  15.1*  --   --  13.0* 13.8* 11.5*  CREATININE  --  0.56 0.54  --   --  0.40* QUESTIONABLE RESULTS, RECOMMEND RECOLLECT TO VERIFY 0.44  --  0.45  0.40*  LATICACIDVEN  --   --   --  1.14  --  1.02  --   --   --   --   VANCOTROUGH 21*  --   --   --   --   --   --   --   --   --     Estimated Creatinine Clearance: 60.9 mL/min (A) (by C-G formula based on SCr of 0.4 mg/dL (L)).    Allergies  Allergen Reactions  . Erythromycin Nausea And Vomiting  . Nitrofurantoin Monohyd Macro Nausea And Vomiting  . Oxybutynin     Dries mouth out   . Penicillins Hives    Has patient had a PCN reaction causing immediate rash, facial/tongue/throat swelling, SOB or lightheadedness with hypotension: Yes Has patient had a PCN reaction causing severe rash involving mucus membranes or skin necrosis: Yes Has patient had a PCN reaction that required hospitalization No Has patient had a PCN reaction occurring within the last 10 years: No-childhood allergy If all of the above answers are "NO", then may proceed with Cephalosporin    Antimicrobials this admission:  1/12 vanc>>1/14 1/12 merrem>>1/14 1/14 Cefepime >>  Dose adjustments this admission:   Microbiology results:  1/12  BCx x2: ngtd 1/12 UCx: NGF 1/13 TA: gram stain negative, NGTD 1/13 MRSA PCR: neg   Thank you for allowing pharmacy to be a part of this patient's care.  Gretta Arab PharmD, BCPS Pager [phone] 02/02/2018 2:26 PM

## 2018-02-02 NOTE — Plan of Care (Signed)
  Problem: Elimination: Goal: Will not experience complications related to bowel motility Outcome: Progressing   Problem: Nutrition: Goal: Adequate nutrition will be maintained Outcome: Progressing   Problem: Safety: Goal: Ability to remain free from injury will improve Outcome: Progressing   

## 2018-02-02 NOTE — Progress Notes (Signed)
PROGRESS NOTE  Angela Page GXQ:[id number] DOB: [birth year] DOA: 01/31/2018 PCP: Default, Provider, MD  HPI/Recap of past 24 hours: Patient is a 48 year old Caucasian female, functional quadriplegic, status post trach and PEG placement, with past medical history significant for polysubstance abuse, sacral ulcers, and recent hospital admissions for pneumonia.  Patient had a prolonged hospitalization complicated by multifocal PNA (staph aureaus and acinetobacter) and was discharged in December 2019.  2 days prior to presentation, patient presented to [facility] ED with dyspnea that improved after successful suctioning of a large mucus plug.  Patient is admitted with complaints of recurrent dyspnea and low-grade fever.  02/02/2018: Patient seen and examined at bedside.  Hypotensive overnight.  She denies dizziness, palpitations, chest pain, or dyspnea at rest.  However she admits to significant fatigue.  Assessment/Plan: Active Problems:   Sepsis ([place])  Right lower lobe HCAP the setting of recurrent pneumonia Presented with low-grade fever and shortness of breath Independently reviewed chest x-ray done on admission 01/31/2018 which revealed right lower lobe infiltrate and possible small bilateral pleural effusion versus atelectasis Procalcitonin mildly elevated at 0.43 Leukocytosis improving MRSA screening negative Discontinue IV vancomycin Tracheal aspirate no growth and no organisms seen De-escalate IV antibiotic from meropenem to IV cefepime Monitor fever curve Monitor WBC Obtain CBC in the morning  Chronic hypotension on midodrine Continue midodrine 10 mg 3 times daily Continue to monitor vital signs closely on telemetry  Symptomatic anemia/iron deficiency anemia Hemoglobin 7.1 Transfuse 2 unit PRBC Repeat CBC posttransfusion No sign of overt bleeding  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  #Sacral decubitus ulcers -wound care -requested air mattress  #Hypokalemia resolved post repletion  #IDDM: Continue to optimize.    # Former smoker -continue home nicotine patch   DVT Prophylaxis:heparin subq 3 times daily Code Status:Full Code Family Communication:d Disposition Plan: This will depend on hospital course.   Consultants:   None  Procedures:   None  Antimicrobials:  IV cefepime  Objective: Vitals:   02/02/18 1300 02/02/18 1315 02/02/18 1320 02/02/18 1333  BP: 95/66  117/90 126/67  Pulse: 81  85 83  Resp: (!) 23  15 13   Temp:  98.3 F (36.8 C)    TempSrc:  Oral    SpO2: 100%  100% 100%    Intake/Output Summary (Last 24 hours) at 02/02/2018 1341 Last data filed at 02/02/2018 1333 Gross per 24 hour  Intake 948.7 ml  Output 1100 ml  Net -151.3 ml   Filed Weights    Exam:  . General: 48 y.o. year-old female well developed well nourished in no acute distress.  Alert and oriented x3.  Trach and PEG tube in place . Cardiovascular: Regular rate and rhythm with no rubs or gallops.  No thyromegaly or JVD noted.   Marland Kitchen Respiratory: Clear to auscultation with no wheezes or rales. Good inspiratory effort. . Abdomen: Soft nontender nondistended with normal bowel sounds x4 quadrants. . Please. . Musculoskeletal: Trace lower extremity edema. 2/4 pulses in all 4 extremities.  Paraplegic. Marland Kitchen Psychiatry: Mood is appropriate for condition and setting   Data Reviewed: CBC: Recent Labs  Lab 01/29/18 1549 01/31/18 1923 01/31/18 1930 02/01/18 1657 02/01/18 2014 02/02/18 0259  WBC 16.9* 15.1*  --  13.0*  13.8* 11.5*  NEUTROABS 13.6* 10.9*  --  8.6*  --  6.4  HGB 8.6* 7.3* 8.2* 7.0* 7.2* 7.1*  HCT 29.3* 24.6* 24.0* 24.7* 25.6* 25.0*   MCV 92.4 92.1  --  96.9 96.2 94.0  PLT 667* 574*  --  503* 525* 952*   Basic Metabolic Panel: Recent Labs  Lab 01/27/18 0733 01/29/18 1549 01/31/18 1737 01/31/18 1930 02/01/18 0452 02/01/18 1657 02/02/18 0259  NA 137 137 137 135 QUESTIONABLE RESULTS, RECOMMEND RECOLLECT TO VERIFY 142 142  141  K 4.2 4.2 3.2* 3.4* QUESTIONABLE RESULTS, RECOMMEND RECOLLECT TO VERIFY 4.1 3.6  3.6  CL 94* 93* 95* 97* QUESTIONABLE RESULTS, RECOMMEND RECOLLECT TO VERIFY 107 107  107  CO2 32 30 26  --  QUESTIONABLE RESULTS, RECOMMEND RECOLLECT TO VERIFY 24 25  25   GLUCOSE 178* 144* 189* 196* QUESTIONABLE RESULTS, RECOMMEND RECOLLECT TO VERIFY 88 100*  104*  BUN 23* 33* 22* 19 QUESTIONABLE RESULTS, RECOMMEND RECOLLECT TO VERIFY 16 18  18   CREATININE 0.55 0.56 0.54 0.40* QUESTIONABLE RESULTS, RECOMMEND RECOLLECT TO VERIFY 0.44 0.45  0.40*  CALCIUM 10.5* 10.2 9.8  --  QUESTIONABLE RESULTS, RECOMMEND RECOLLECT TO VERIFY 8.8* 8.9  9.0  MG 2.1  --   --   --  QUESTIONABLE RESULTS, RECOMMEND RECOLLECT TO VERIFY 2.1 2.0  PHOS 4.0  --   --   --   --   --  3.9   GFR: Estimated Creatinine Clearance: 60.9 mL/min (A) (by C-G formula based on SCr of 0.4 mg/dL (L)). Liver Function Tests: Recent Labs  Lab 01/29/18 1549 02/02/18 0259  AST 26  --   ALT 23  --   ALKPHOS 163*  --   BILITOT 0.6  --   PROT 8.6*  --   ALBUMIN 3.4* 2.7*   No results for input(s): LIPASE, AMYLASE in the last 168 hours. No results for input(s): AMMONIA in the last 168 hours. Coagulation Profile: No results for input(s): INR, PROTIME in the last 168 hours. Cardiac Enzymes: No results for input(s): CKTOTAL, CKMB, CKMBINDEX, TROPONINI in the last 168 hours. BNP (last 3 results) Recent Labs    04/28/17 1110  PROBNP 62.0   HbA1C: No results for input(s): HGBA1C in the last 72 hours. CBG: Recent Labs  Lab 02/01/18 1720 02/01/18 2302 02/02/18 0506 02/02/18 1134  GLUCAP 77 95 113* 180*   Lipid Profile: No results for  input(s): CHOL, HDL, LDLCALC, TRIG, CHOLHDL, LDLDIRECT in the last 72 hours. Thyroid Function Tests: No results for input(s): TSH, T4TOTAL, FREET4, T3FREE, THYROIDAB in the last 72 hours. Anemia Panel: No results for input(s): VITAMINB12, FOLATE, FERRITIN, TIBC, IRON, RETICCTPCT in the last 72 hours. Urine analysis:    Component Value Date/Time   COLORURINE YELLOW 01/31/2018 2102   APPEARANCEUR CLOUDY (A) 01/31/2018 2102   LABSPEC 1.016 01/31/2018 2102   PHURINE 7.0 01/31/2018 2102   GLUCOSEU NEGATIVE 01/31/2018 2102   GLUCOSEU NEGATIVE 04/28/2017 1121   HGBUR MODERATE (A) 01/31/2018 2102   BILIRUBINUR NEGATIVE 01/31/2018 2102   KETONESUR NEGATIVE 01/31/2018 2102   PROTEINUR 100 (A) 01/31/2018 2102   UROBILINOGEN 0.2 04/28/2017 1121   NITRITE NEGATIVE 01/31/2018 2102   LEUKOCYTESUR LARGE (A) 01/31/2018 2102   Sepsis Labs: @LABRCNTIP (procalcitonin:4,lacticidven:4)  ) Recent Results (from the past 240 hour(s))  Culture, blood (routine x 2)     Status: None (Preliminary result)   Collection Time: 01/31/18  5:47 PM  Result Value Ref Range Status   Specimen Description   Final  BLOOD RIGHT HAND Performed at [facility], [place] [address] [place], [place] [address]    Special Requests   Final    BOTTLES DRAWN AEROBIC AND ANAEROBIC Blood Culture adequate volume Performed at [place] [address], [place], [place] [address]    Culture   Final    NO GROWTH 2 DAYS Performed at [place] [address] [place], [place] [address]    Report Status PENDING  Incomplete  Urine culture     Status: None   Collection Time: 01/31/18  9:02 PM  Result Value Ref Range Status   Specimen Description   Final    URINE, RANDOM Performed at [place] [address], [place], [place] [address]    Special Requests   Final    NONE Performed at [facility], [place] [address], [place], [place]  [address]    Culture   Final    Multiple bacterial morphotypes present, none predominant. Suggest appropriate recollection if clinically indicated.   Report Status 02/02/2018 FINAL  Final  Culture, respiratory (non-expectorated)     Status: None (Preliminary result)   Collection Time: 02/01/18 12:15 AM  Result Value Ref Range Status   Specimen Description   Final    TRACHEAL ASPIRATE Performed at [facility] [address], [place], [place] [address]    Special Requests   Final    Normal Performed at [facility], [place] [address], [place], [place] [address]    Gram Stain   Final    FEW WBC PRESENT,BOTH PMN AND MONONUCLEAR NO ORGANISMS SEEN    Culture   Final    NO GROWTH 1 DAY Performed at [facility], [place] [address] [place], [place] [address]    Report Status PENDING  Incomplete  MRSA PCR Screening     Status: None   Collection Time: 02/01/18 11:44 AM  Result Value Ref Range Status   MRSA by PCR NEGATIVE NEGATIVE Final    Comment:        The GeneXpert MRSA Assay (FDA approved for NASAL specimens only), is one component of a comprehensive MRSA colonization surveillance program. It is not intended to diagnose MRSA infection nor to guide or monitor treatment for MRSA infections. Performed at [facility], [place] [address] [place],  [address]       Studies: No results found.  Scheduled Meds: . baclofen  10 mg Per Tube TID  . chlorhexidine  15 mL Mouth Rinse BID  . famotidine  20 mg Per Tube BID  . feeding supplement (OSMOLITE 1.2 CAL)  1,000 mL Per Tube Q24H  . feeding supplement (PRO-STAT SUGAR FREE 64)  30 mL Per Tube Daily  . [START ON 02/03/2018] fentaNYL  25 mcg Transdermal Q72H  . furosemide  40 mg Intravenous BID  . gabapentin  300 mg Per Tube Q8H  . heparin  5,000 Units Subcutaneous Q8H  . insulin aspart  0-9 Units Subcutaneous Q6H  . insulin glargine  20 Units Subcutaneous QHS  . mouth  rinse  15 mL Mouth Rinse q12n4p  . Melatonin  2 tablet Oral QHS  . midodrine  10 mg Per J Tube TID WC  . nicotine  14 mg Transdermal Daily  . polyethylene glycol  17 g Per J Tube Daily    Continuous Infusions: . meropenem (MERREM) IV Stopped (02/02/18 0902)     LOS: 2 days  Kayleen Memos, MD [facility] Pager [phone]  If 7PM-7AM, please contact night-coverage [email or web address] Password [email or web address] 02/02/2018, 1:41 PM

## 2018-02-02 NOTE — Progress Notes (Signed)
02/02/2018  0733 Notified MD of patient's BP 81/52. Will continue to monitor. Waiting for orders.

## 2018-02-03 LAB — TYPE AND SCREEN
ABO/RH(D): A POS
Antibody Screen: NEGATIVE
Unit division: 0
Unit division: 0

## 2018-02-03 LAB — MAGNESIUM: Magnesium: 2.1 mg/dL (ref 1.7–2.4)

## 2018-02-03 LAB — CBC WITH DIFFERENTIAL/PLATELET
Abs Immature Granulocytes: 0.06 10*3/uL (ref 0.00–0.07)
Basophils Absolute: 0.1 10*3/uL (ref 0.0–0.1)
Basophils Relative: 0 %
Eosinophils Absolute: 1.1 10*3/uL — ABNORMAL HIGH (ref 0.0–0.5)
Eosinophils Relative: 8 %
HCT: 29.1 % — ABNORMAL LOW (ref 36.0–46.0)
Hemoglobin: 8.7 g/dL — ABNORMAL LOW (ref 12.0–15.0)
Immature Granulocytes: 0 %
Lymphocytes Relative: 13 %
Lymphs Abs: 1.8 10*3/uL (ref 0.7–4.0)
MCH: 26.8 pg (ref 26.0–34.0)
MCHC: 29.9 g/dL — ABNORMAL LOW (ref 30.0–36.0)
MCV: 89.5 fL (ref 80.0–100.0)
Monocytes Absolute: 0.8 10*3/uL (ref 0.1–1.0)
Monocytes Relative: 5 %
Neutro Abs: 10.5 10*3/uL — ABNORMAL HIGH (ref 1.7–7.7)
Neutrophils Relative %: 74 %
Platelets: 475 10*3/uL — ABNORMAL HIGH (ref 150–400)
RBC: 3.25 MIL/uL — AB (ref 3.87–5.11)
RDW: 17.2 % — AB (ref 11.5–15.5)
WBC: 14.3 10*3/uL — ABNORMAL HIGH (ref 4.0–10.5)
nRBC: 0 % (ref 0.0–0.2)

## 2018-02-03 LAB — CULTURE, RESPIRATORY W GRAM STAIN
Culture: NO GROWTH
Special Requests: NORMAL

## 2018-02-03 LAB — GLUCOSE, CAPILLARY
Glucose-Capillary: 174 mg/dL — ABNORMAL HIGH (ref 70–99)
Glucose-Capillary: 226 mg/dL — ABNORMAL HIGH (ref 70–99)
Glucose-Capillary: 179 mg/dL — ABNORMAL HIGH (ref 70–99)

## 2018-02-03 LAB — BPAM RBC
BLOOD PRODUCT EXPIRATION DATE: 202002082359
Blood Product Expiration Date: 202002082359
ISSUE DATE / TIME: 202001141307
ISSUE DATE / TIME: 202001141609
Unit Type and Rh: 6200
Unit Type and Rh: 6200

## 2018-02-03 LAB — BASIC METABOLIC PANEL
Anion gap: 10 (ref 5–15)
BUN: 22 mg/dL — ABNORMAL HIGH (ref 6–20)
CO2: 24 mmol/L (ref 22–32)
Calcium: 9.2 mg/dL (ref 8.9–10.3)
Chloride: 106 mmol/L (ref 98–111)
Creatinine, Ser: 0.48 mg/dL (ref 0.44–1.00)
GFR calc Af Amer: >60 mL/min (ref >60)
Glucose, Bld: 180 mg/dL — ABNORMAL HIGH (ref 70–99)
POTASSIUM: 4.8 mmol/L (ref 3.5–5.1)
Sodium: 140 mmol/L (ref 135–145)

## 2018-02-03 MED ORDER — JEVITY 1.2 CAL PO LIQD: 1000.0000 mL | ORAL | Status: DC

## 2018-02-03 MED ORDER — CIPROFLOXACIN HCL 500 MG PO TABS: 500.0000 mg | ORAL_TABLET | Freq: Two times a day (BID) | ORAL | 0 refills | Status: AC

## 2018-02-03 MED ORDER — FREE WATER
100.0000 mL | Freq: Four times a day (QID) | Status: DC
  Administered 2018-02-03: 100 mL

## 2018-02-03 MED ORDER — OSMOLITE 1.5 CAL PO LIQD
1000.0000 mL | ORAL | Status: DC
  Administered 2018-02-03: 1000 mL
  Filled 2018-02-03: qty 1000

## 2018-02-03 NOTE — NC FL2 (Signed)
[place] LEVEL OF CARE SCREENING TOOL     IDENTIFICATION  Patient Name: Angela Page Birthdate: 02-12-1970 Sex: female Admission Date (Current Location): 01/31/2018  [facility] and [place] Number:  Herbalist and Address:  [facility],  [place] [address], [place]      Provider Number: [id number]  Attending Physician Name and Address:  Cristy Folks, MD  Relative Name and Phone Number:       Current Level of Care: Hospital Recommended Level of Care: [place] Prior Approval Number:    Date Approved/Denied:   PASRR Number:    Discharge Plan: SNF    Current Diagnoses: Patient Active Problem List   Diagnosis Date Noted  . Sepsis ([place]) 01/31/2018  . Acute on chronic respiratory failure with hypoxia ([place])   . Quadriplegia and quadriparesis ([place])   . Lobar pneumonia ([place])   . At high risk for severe sepsis   . Tracheostomy status ([place])   . Decubitus ulcer of buttock, unstageable ([place])   . Hyperglycemic hyperosmolar nonketotic coma ([place]) 12/16/2017  . Lactic acid acidosis 12/16/2017  . AKI (acute kidney injury) ([place]) 12/16/2017  . Suprapubic catheter ([place]) 12/16/2017  . [place] (hyperglycemic hyperosmolar nonketotic coma) ([place]) 12/16/2017  . Elevated lactic acid level   . Pressure injury of skin 07/17/2017  . Kidney stone 07/16/2017  . Right ureteral stone 03/10/2017  . UTI (urinary tract infection) 03/09/2017  . Ineffective airway clearance   . Sepsis secondary to UTI ([place]) 06/30/2016  . Autonomic neuropathy 06/10/2016  . Presence of permanent cardiac pacemaker 06/10/2016  . 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 Chronically on opiate therapy 06/10/2016  . Indwelling Foley catheter present 06/10/2016  . PEG (percutaneous endoscopic gastrostomy) status ([place]) 06/10/2016  . Pulmonary hypertension ([place]) 06/10/2016  . Hypoalbuminemia 06/10/2016  . Abnormal transaminases   . Peripheral edema    . Tracheostomy dependence ([place])   . Chronic obstructive pulmonary disease ([place])   . Chronic diastolic CHF (congestive heart failure) ([place]) 01/23/2016  . Tracheostomy status ([place])   . Esophageal dysphagia   . Malnutrition of moderate degree 11/01/2015  . Tobacco abuse 10/31/2015  . Asthma 10/31/2015  . History of pneumonia 10/08/2015  . Pressure ulcer of contiguous region involving buttock and hip, stage 4 ([place]) 02/26/2015  . Hypotension 02/25/2015  . Leukocytosis 10/23/2014  . Hypokalemia 10/23/2014  . Decubitus ulcer of sacral region, stage 4 ([place]) 10/23/2014  . Anemia, iron deficiency 10/23/2014  . Quadriplegia, C5-C7 incomplete ([place]) 10/22/2014  . Depression   . Protein-calorie malnutrition, severe ([place]) 09/05/2014  . 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 Recurrent UTI 03/31/2014  . Pressure ulcer of coccygeal region, stage 4 ([place]) 01/06/2014  . Stage 4 skin ulcer of sacral region ([place]) 01/06/2014  . Neuropathic pain 11/30/2013  . Paraplegia following spinal cord injury ([place]) 11/30/2013  . Spinal cord injury, cervical region ([place]) 11/30/2013  . Chronic pain due to injury 11/30/2013  . S/P cervical spinal fusion 10/27/2013  . Tracheostomy care ([place]) 10/27/2013  . 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 SCI (spinal cord injury) 10/11/2013    Orientation RESPIRATION BLADDER Height & Weight     Self, Time, Situation, Place  O2(Trach) Incontinent, Indwelling catheter Weight: (error in charting) Height:     BEHAVIORAL SYMPTOMS/MOOD NEUROLOGICAL BOWEL NUTRITION STATUS      Incontinent Diet(See dc summary)  AMBULATORY STATUS COMMUNICATION OF NEEDS Skin   Total Care Verbally Normal  Personal Care Assistance Level of Assistance  Total care       Total Care Assistance: Maximum assistance   Functional Limitations Info  Sight, Hearing, Speech Sight Info: Adequate Hearing Info: Adequate Speech Info: Adequate    SPECIAL CARE  FACTORS FREQUENCY                       Contractures Contractures Info: Not present    Additional Factors Info  Code Status, Allergies Code Status Info: Full Allergies Info: : Erythromycin, Nitrofurantoin Monohyd Macro, Oxybutynin, Penicillins           Current Medications (02/03/2018):  This is the current hospital active medication list Current Facility-Administered Medications  Medication Dose Route Frequency Provider Last Rate Last Dose  . baclofen (LIORESAL) tablet 10 mg  10 mg Per Tube TID Colbert Ewing, MD   10 mg at 02/03/18 0908  . ceFEPIme (MAXIPIME) 2 g in sodium chloride 0.9 % 100 mL IVPB  2 g Intravenous Q8H Shade, Haze Justin, RPH   Stopped at 02/03/18 0720  . chlorhexidine (PERIDEX) 0.12 % solution 15 mL  15 mL Mouth Rinse BID Dana Allan I, MD   15 mL at 02/03/18 0904  . famotidine (PEPCID) tablet 20 mg  20 mg Per Tube BID Colbert Ewing, MD   20 mg at 02/03/18 6967  . feeding supplement (OSMOLITE 1.5 CAL) liquid 1,000 mL  1,000 mL Per Tube Continuous Purohit, Shrey C, MD      . feeding supplement (PRO-STAT SUGAR FREE 64) liquid 30 mL  30 mL Per Tube Daily Dana Allan I, MD   30 mL at 02/03/18 0905  . fentaNYL (DURAGESIC - dosed mcg/hr) patch 25 mcg  25 mcg Transdermal Q72H Colbert Ewing, MD   1 patch at 02/03/18 0900  . free water 100 mL  100 mL Per Tube Q6H Purohit, Shrey C, MD      . furosemide (LASIX) injection 40 mg  40 mg Intravenous BID Dana Allan I, MD   40 mg at 02/03/18 0900  . gabapentin (NEURONTIN) 250 MG/5ML solution 300 mg  300 mg Per Tube Q8H Park, Derenda Mis, MD   300 mg at 02/03/18 0650  . heparin injection 5,000 Units  5,000 Units Subcutaneous Q8H Park, Derenda Mis, MD   5,000 Units at 02/03/18 [phone]  . insulin aspart (novoLOG) injection 0-9 Units  0-9 Units Subcutaneous Q6H Dana Allan I, MD   3 Units at 02/03/18 1217  . insulin glargine (LANTUS) injection 20 Units  20 Units Subcutaneous QHS Colbert Ewing, MD   20  Units at 02/02/18 2211  . ipratropium-albuterol (DUONEB) 0.5-2.5 (3) MG/3ML nebulizer solution 3 mL  3 mL Nebulization Q4H PRN Park, Derenda Mis, MD      . LORazepam (ATIVAN) tablet 0.5 mg  0.5 mg Per Tube Q8H PRN Colbert Ewing, MD   0.5 mg at 02/03/18 1017  . MEDLINE mouth rinse  15 mL Mouth Rinse q12n4p Dana Allan I, MD   15 mL at 02/03/18 1155  . Melatonin TABS 6 mg  2 tablet Oral QHS Colbert Ewing, MD   6 mg at 02/02/18 2211  . metoCLOPramide (REGLAN) tablet 5 mg  5 mg Per Tube Q6H PRN Park, Derenda Mis, MD      . midodrine (PROAMATINE) tablet 10 mg  10 mg Per J Tube TID WC Park, Derenda Mis, MD   10 mg at 02/03/18 1155  . nicotine (NICODERM CQ -  dosed in mg/24 hours) patch 14 mg  14 mg Transdermal Daily Colbert Ewing, MD   14 mg at 02/03/18 4035  . oxyCODONE (Oxy IR/ROXICODONE) immediate release tablet 10 mg  10 mg Per Tube Q4H PRN Dana Allan I, MD   10 mg at 02/03/18 1155  . polyethylene glycol (MIRALAX / GLYCOLAX) packet 17 g  17 g Per J Tube Daily Park, Derenda Mis, MD   17 g at 02/02/18 0900  . zolpidem (AMBIEN) tablet 5 mg  5 mg Oral QHS PRN Colbert Ewing, MD   5 mg at 02/01/18 0037     Discharge Medications: Please see discharge summary for a list of discharge medications.  Relevant Imaging Results:  Relevant Lab Results:   Additional Information SSN: [id number]  Servando Snare, LCSW

## 2018-02-03 NOTE — Clinical Social Work Placement (Signed)
   Patient returning to [facility] for rehab.  LCSW confirmed with facility.   Patient to report to [facility].   Patient will transport by [facility].  LCSW notified family.  RN report #: [id number].   BK[facility]  CLINICAL SOCIAL WORK PLACEMENT  NOTE  Date:  02/03/2018  Patient Details  Name: Angela Page MRN: [id number] Date of Birth: [birth year]  Clinical Social Work is seeking post-discharge placement for this patient at the   level of care (*CSW will initial, date and re-position this form in  chart as items are completed):      Patient/family provided with [facility] Work Department's list of facilities offering this level of care within the geographic area requested by the patient (or if unable, by the patient's family).      Patient/family informed of their freedom to choose among providers that offer the needed level of care, that participate in Medicare, Medicaid or managed care program needed by the patient, have an available bed and are willing to accept the patient.      Patient/family informed of [facility]'s ownership interest in [facility] and [facility], as well as of the fact that they are under no obligation to receive care at these facilities.  PASRR submitted to EDS on       PASRR number received on       Existing PASRR number confirmed on       FL2 transmitted to all facilities in geographic area requested by pt/family on       FL2 transmitted to all facilities within larger geographic area on       Patient informed that his/her managed care company has contracts with or will negotiate with certain facilities, including the following:            Patient/family informed of bed offers received.  Patient chooses bed at     [facility] recommends and patient chooses bed at      Patient to be transferred to   on  .  Patient to be transferred to facility by     EMS  Patient family notified on   of transfer.  02/03/2018.   Name of family member notified:      Mother, [name]  PHYSICIAN       Additional Comment:    _______________________________________________ Servando Snare, LCSW 02/03/2018, 2:49 PM

## 2018-02-03 NOTE — Evaluation (Signed)
Clinical/Bedside Swallow Evaluation Patient Details  Name: Angela Page MRN: [id number] Date of Birth: July 01, 1970  Today's Date: 02/03/2018 Time: SLP Start Time (ACUTE ONLY): 1025 SLP Stop Time (ACUTE ONLY): 1057 SLP Time Calculation (min) (ACUTE ONLY): 32 min  Past Medical History:  Past Medical History:  Diagnosis Date  . Acute on chronic respiratory failure with hypoxia ([place])   . Anasarca 06/10/2016  . Asthma   . At high risk for severe sepsis   . Cocaine use 10/08/2013  . Decubitus ulcer of buttock, unstageable ([place])   . Depression   . GERD (gastroesophageal reflux disease)   . HCAP (healthcare-associated pneumonia) 06/09/2016  . Hepatitis    hx of hepatits frm mono   . Kidney stone   . Lobar pneumonia ([place])   . Overdose of opiate or related narcotic ([place]) 09/02/2014  . Pacemaker   . Pleurisy   . Polysubstance dependence including opioid type drug, episodic abuse ([place]) 10/27/2013  . Protein calorie malnutrition ([place]) 10/31/2015  . Quadriparesis ([place])   . Quadriplegia and quadriparesis ([place])   . Quadriplegia, C5-C7 incomplete ([place]) 10/08/2013  . Stage IV pressure ulcer of sacral region ([place]) 10/31/2015  . Tracheostomy status [facility])    Past Surgical History:  Past Surgical History:  Procedure Laterality Date  . APPENDECTOMY    . BACK SURGERY  10/08/2013   fusion of spine, cervical region  . CARDIAC SURGERY    . CYSTOSCOPY W/ URETERAL STENT PLACEMENT Right 03/09/2017   Procedure: CYSTOSCOPY [name] STENT PLACEMENT;  Surgeon: Irine Seal, MD;  Location: [facility];  Service: Urology;  Laterality: Right;  . CYSTOSCOPY W/ URETERAL STENT REMOVAL Right 07/16/2017   Procedure: CYSTOSCOPY WITH STENT REMOVAL;  Surgeon: Franchot Gallo, MD;  Location: [facility];  Service: Urology;  Laterality: Right;  . CYSTOSCOPY/URETEROSCOPY/HOLMIUM LASER/STENT PLACEMENT Right 07/16/2017   Procedure: CYSTOSCOPY/URETEROSCOPY/HOLMIUM LASER/STENT PLACEMENT/ALSO STONE EXTRACTION;  Surgeon:  Franchot Gallo, MD;  Location: [facility];  Service: Urology;  Laterality: Right;  . GASTROSTOMY  11/23/2015  . I&D EXTREMITY  10/28/2011   Procedure: IRRIGATION AND DEBRIDEMENT EXTREMITY;  Surgeon: Tennis Must, MD;  Location: [place];  Service: Orthopedics;  Laterality: Right;  Irrigation and debridement right middle finger  . INSERTION OF SUPRAPUBIC CATHETER N/A 07/28/2016   Procedure: INSERTION OF SUPRAPUBIC CATHETER;  Surgeon: Franchot Gallo, MD;  Location: [facility];  Service: Urology;  Laterality: N/A;  . IR GENERIC HISTORICAL  11/23/2015   IR GASTROSTOMY TUBE MOD SED 11/23/2015 [facility]  . IR PATIENT EVAL TECH 0-60 MINS  12/18/2017  . IR REPLACE G-TUBE SIMPLE WO FLUORO  12/19/2017  . LITHOTRIPSY  08/2017  . TRACHEOSTOMY     HPI:  48 yo female adm to [facility] with fever and cough.  Pt found to have possible UTI/pna.  Pt with complex medical hx including spinal cord injury, paraplegia s/p trach/peg - dysphagia.  Pt recently admit with SOB and found to have mucus plug.  She has been npo for 2 months per her report and really wants to drink thin liquids.  Swallow evaluation ordered.  Pt is a full code. .     Assessment / Plan / Recommendation Clinical Impression  Pt is a complex pt with chronic dysphagia hx.  She adamantly desires po intake *thin liquids or water* alone would make her happy even with aspiration risk.    NO focal CN deficits noted but pt with extensive hardware in her neck and h/o poor laryngeal closure.     Pt  was seen by this SLP 07/2017 with functional swallow at that time but she has multiple risk factors for asp pnas bedsides dysphagia.  Provided pt with po of ice chips, thin via tsp,cup, straw, nectar via tsp, cup, straw, puree and cracker.  She presents with multiple swallows across all consistencies - stating this is to help clear and not because she senses it's required.  Wet voice and weak cough noted with thin liquids 3/6 boluses.  Belching observed  after applesauce and cracker - pt denies this being productive.  Suspect multifactorial dysphagia present with pharyngeal=cervical esophageal and esophageal deficits with asp risk.  Pt quite tearful during session and stated she wants to drink liquids - prefers all but willing to consume thin water only if that is all she can have.    Given recent mucus plug, thin water consumption may help with oral hygiene/hydration/decreasing plug risk.  At this time repeat MBS would not change care plan if pt willing to accept po water only at this time and follow up with SLP at [facility].    SLP Visit Diagnosis: Dysphagia, pharyngoesophageal phase (R13.14);Other (comment)(suspect esophageal, pt did have order for esophagram but not completed)    Aspiration Risk  Moderate aspiration risk    Diet Recommendation NPO;Free water protocol after oral care   Medication Administration: Via alternative means Supervision: Full supervision/cueing for compensatory strategies Compensations: Small sips/bites;Multiple dry swallows after each bite/sip Postural Changes: Seated upright at 90 degrees;Remain upright for at least 30 minutes after po intake    Other  Recommendations Oral Care Recommendations: Oral care QID   Follow up Recommendations Skilled Nursing facility      Frequency and Duration            Prognosis        Swallow Study   General Date of Onset: 02/03/18 HPI: 48 yo female adm to [facility] with fever and cough.  Pt found to have possible UTI/pna.  Pt with complex medical hx including spinal cord injury, paraplegia s/p trach/peg - dysphagia.  Pt recently admit with SOB and found to have mucus plug.  She has been npo for 2 months per her report and really wants to drink thin liquids.  Swallow evaluation ordered.  Pt is a full code. .   Type of Study: Bedside Swallow Evaluation Previous Swallow Assessment: MBS 01/22/2018 no results or flouro loops available in system, SLP spoke to [facility] who reports pt had  started diet of dys2/nectar but immediately spiked temperatures the next date and was made NPO Diet Prior to this Study: NPO;PEG tube Temperature Spikes Noted: No Respiratory Status: Room air History of Recent Intubation: No Behavior/Cognition: Alert;Cooperative;Pleasant mood;Other (Comment)(frustrated and being npo) Oral Cavity - Dentition: Missing dentition Vision: Functional for self-feeding Self-Feeding Abilities: Total assist Patient Positioning: Upright in bed Baseline Vocal Quality: Normal Volitional Cough: Weak Volitional Swallow: Able to elicit    Oral/Motor/Sensory Function Overall Oral Motor/Sensory Function: Within functional limits   Ice Chips Ice chips: Impaired Presentation: Spoon Pharyngeal Phase Impairments: Decreased hyoid-laryngeal movement;Multiple swallows   Thin Liquid Thin Liquid: Impaired Presentation: Cup;Spoon;Straw Pharyngeal  Phase Impairments: Multiple swallows;Cough - Delayed;Wet Vocal Quality;Decreased hyoid-laryngeal movement    Nectar Thick Nectar Thick Liquid: Impaired Pharyngeal Phase Impairments: Multiple swallows;Cough - Immediate Other Comments: cough with tsps of nectar, relayed she was not ready for po, no overt indications of aspiration with cup or straw boluses   Honey Thick Honey Thick Liquid: Not tested   Puree Puree: Impaired Pharyngeal Phase Impairments:  Multiple swallows;Decreased hyoid-laryngeal movement Other Comments: belching noted post-swallow of applesauce   Solid     Solid: Impaired Pharyngeal Phase Impairments: Multiple swallows;Decreased hyoid-laryngeal movement      Angela Page 02/03/2018,11:29 AM   Angela Salk, MS [facility] SLP [place] Pager [phone] Office [phone]

## 2018-02-03 NOTE — Progress Notes (Signed)
Initial Nutrition Assessment  DOCUMENTATION CODES:   Not applicable  INTERVENTION:  - Will change TF per MD okay: Osmolite 1.5 @ 45 mL/hr with 30 mL Prostat once/day to provide 1720 kcal, 83 grams of protein, and 823 mL free water. - Will order 100 mL free water QID (400 mL/day).   NUTRITION DIAGNOSIS:   Increased nutrient needs related to wound healing as evidenced by estimated needs.  GOAL:   Patient will meet greater than or equal to 90% of their needs  MONITOR:   TF tolerance, Weight trends, Labs, Skin  REASON FOR ASSESSMENT:   New TF  ASSESSMENT:   48 year old female with PMH of functional quadriplegia, s/p trach and PEG, hx ofpolysubstance abuse, sacral ulcers, and recent hospital admissions for PNA.  Patient had a prolonged hospitalization complicated by multifocal PNA (staph aureaus and acinetobacter) and was discharged in 12/2017. On 1/10 she patient presented to [facility] ED with dyspnea that improved after successful suctioning of a large mucus plug. She returned to the ED on 1/12  with complaints of recurrent dyspnea and low-grade fever.  Patient is NPO except water per free water protocol. Patient being followed by SLP; RD spoke with SLP outside of patient's room prior to RD's visit with patient.   Patient has PEG in place (current tube placed 12/19/17)and she is receiving Osmolite 1.2 @ 40 mL/hr with 30 mL Prostat once/day which is providing 1252 kcal (78% estimated kcal need), 68 grams of protein (85% estimated protein need), and 787 mL free water.   Patient reports being on Osmolite 1.2 at [facility] but is unsure at which rate TF was running while she was there. She believes that she was receiving it over 24 hours/day. She reports that she has had ongoing abdominal pain/pressure with feeling of tightness. She states that d/t this she has refused feedings in the past. She prefers TF to be at a lower rate so that it does not add to abdominal tightness. Patient became  tearful about this situation.    Medications reviewed; 20 mg pepcid per PEG BID, 40 mg IV lasix BID, sliding scale novolog, 20 units lantus/day, 1 packet miralax/day. Labs reviewed; CBG: 174 mg/dl, BUN: 22 mg/dl.    NUTRITION - FOCUSED PHYSICAL EXAM:  Completed to upper body only; no muscle and no fat wasting.   Diet Order:   Diet Order    None      EDUCATION NEEDS:   No education needs have been identified at this time  Skin:  Skin Assessment: Skin Integrity Issues: Skin Integrity Issues:: Stage IV Stage IV: L IT (last documented 05/2016)  Last BM:  1/13  Height:   Ht Readings from Last 1 Encounters:  12/17/17 4\' 9"  (1.448 m)    Weight:   Wt Readings from Last 1 Encounters:  01/29/18 53.1 kg    Ideal Body Weight:  37.31 kg  BMI:  There is no height or weight on file to calculate BMI.  Estimated Nutritional Needs:   Kcal:  1600-1800 kcal  Protein:  80-90 grams  Fluid:  >/= 1.8 L/day     Jarome Matin, MS, RD, LDN, [facility] Inpatient Clinical Dietitian Pager # [phone] After hours/weekend pager # [phone]

## 2018-02-03 NOTE — Progress Notes (Signed)
02/03/2018  1745  Report called to [place] and given to [place].

## 2018-02-03 NOTE — Progress Notes (Signed)
02/03/2018  1600 Per SW she has already called [facility] for transport of patient back to [facility]. Waiting for transport.

## 2018-02-03 NOTE — Progress Notes (Signed)
Trach check done. No distress noted at this time. Back equipment in room.

## 2018-02-03 NOTE — Plan of Care (Signed)
  Problem: Elimination: Goal: Will not experience complications related to bowel motility Outcome: Progressing   Problem: Nutrition: Goal: Adequate nutrition will be maintained Outcome: Progressing   Problem: Pain Managment: Goal: General experience of comfort will improve Outcome: Progressing   

## 2018-02-03 NOTE — Clinical Social Work Note (Addendum)
Clinical Social Work Assessment  Patient Details  Name: Angela Page MRN: [id number] Date of Birth: [birth year]  Date of referral:  02/03/18               Reason for consult:  Discharge Planning                Permission sought to share information with:  Facility Sport and exercise psychologist, Family Supports Permission granted to share information::  Yes, Verbal Permission Granted  Name::     Angela Page  Agency::  [employer]  Relationship::  Mother  Contact Information:     Housing/Transportation Living arrangements for the past 2 months:  [place], [place] of Information:  Patient, Facility Patient Interpreter Needed:  None Criminal Activity/Legal Involvement Pertinent to Current Situation/Hospitalization:  No - Comment as needed Significant Relationships:  Dependent Children, Parents Lives with:  Minor Children, Parents Do you feel safe going back to the place where you live?  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 Patient and facility report this is a short term placement.   Patient express concerns about facility being able to manage her medical needs as she has had to be sent to the hospital twice since being in the facility.   Social Worker assessment / plan:  LCSW consulted for dc planning.   LCSW met at bedside with patient. LCSW explained role and reason for visit. Patient was pleasant but became frustrated and tearful at the recommendation to return to SNF. Patient asked LCSW to call her mother to discuss dc plan.   LCSW and patient spoke to [place], pt mother on speaker phone. Patient expressed her concerns with returning to facility. LCSW discussed speech recommendation for SNF. Angela Page was able to convince patient to return to SNF. Patient states that she is being held at the facility against her will. Angela Page explained that she could  return home when she is able to swallow without aspirating. Patient expressed understanding.   PLAN: Patient will return to SNF.   Employment status:  Disabled (Comment on whether or not currently receiving Disability) Insurance information:  Medicare PT Recommendations:  Not assessed at this time Information / Referral to community resources:     Patient/Family's Response to care:  Patient is thankful for LCSW visit.   Patient/Family's Understanding of and Emotional Response to Diagnosis, Current Treatment, and Prognosis:  Patient is not realistic about her current goals and did not seem to understand the severity of her condition as she continued to want to go home against the recommendations of hospital professionals. Patient agreeable to return, but apprehensive. Patient reports that she is concerned about her medical needs. LCSW discussed other facilities and patient states she doesn't want to go to another facility she wants to go home. Patient is tearful, but agreeable at end of assessment.     Emotional Assessment Appearance:  Appears stated age Attitude/Demeanor/Rapport:  Crying Affect (typically observed):  Frustrated, Tearful/Crying, Hopeless Orientation:  Oriented to Self, Oriented to Place, Oriented to  Time, Oriented to Situation Alcohol / Substance use:  Not Applicable Psych involvement (Current and /or in the community):  No (Comment)  Discharge Needs  Concerns to be addressed:  No discharge needs identified Readmission within the last 30 days:  No Current discharge risk:  None Barriers to Discharge:  No Barriers Identified   Servando Snare, LCSW 02/03/2018, 3:08 PM

## 2018-02-03 NOTE — Discharge Instructions (Signed)
Antibiotic Medicine, Adult ° °Antibiotic medicines treat infections caused by a type of germ called bacteria. They work by killing the bacteria that make you sick. °When do I need to take antibiotics? °You often need these medicines to treat bacterial infections, such as: °· A urinary tract infection (UTI). °· Strep throat. °· Meningitis. This affects the spinal cord and brain. °· A bad lung infection. °You may start the medicines while your doctor waits for tests to come back. When the tests come back, your doctor may change or stop your medicine. °When are antibiotics not needed? °You do not need these medicines for most common illnesses, such as: °· A cold. °· The flu. °· A sore throat. °Antibiotics are not always needed for all infections caused by bacteria. Do not ask for these medicines, or take them, when they are not needed. °What are the risks of taking antibiotics? °Most antibiotics can cause an infection called Clostridioides difficile (C. diff).This causes watery poop (diarrhea). Let your doctor know right away if: °· You have watery poop while taking an antibiotic. °· You have watery poop after you stop taking an antibiotic. The illness can happen weeks after you stop the medicine. °You also have a risk of getting an infection in the future that antibiotics cannot treat (antibiotic-resistant infection). This type of infection can be dangerous. °What else should I know about taking antibiotics? ° °· You need to take the entire prescription. °? Take the medicine for as long as told by your doctor. °? Do not stop taking it even if you start to feel better. °· Try not to miss any doses. If you miss a dose, call your doctor. °· Birth control pills may not work. If you take birth control pills: °? Keep on taking them. °? Use a second form of birth control, such as a condom. Do this for as long as told by your doctor. °· Ask your doctor: °? How long to wait in between doses. °? If you should take the medicine  with food. °? If there is anything you should stay away from while taking the antibiotic, such as: °? Food. °? Drinks. °? Medicines. °? If there are any side effects you should watch for. °· Only take the medicines that your doctor told you to take. Do not take medicines that were given to someone else. °· Drink a large glass of water with the medicine. °· Ask the pharmacist for a tool to measure the medicine, such as: °? A syringe. °? A cup. °? A spoon. °· Throw away any extra medicine. °Contact a doctor if: °· You get worse. °· You have new joint pain or muscle aches after starting the medicine. °· You have side effects from the medicine, such as: °? Stomach pain. °? Watery poop. °? Feeling sick to your stomach (nausea). °Get help right away if: °· You have signs of a very bad allergic reaction. If this happens, stop taking the medicine right away. Signs may include: °? Hives. These are raised, itchy, red bumps on the skin. °? Skin rash. °? Trouble breathing. °? Wheezing. °? Swelling. °? Feeling dizzy. °? Throwing up (vomiting). °· Your pee (urine) is dark, or is the color of blood. °· Your skin turns yellow. °· You bruise easily. °· You bleed easily. °· You have very bad watery poop and cramps in your belly. °· You have a very bad headache. °Summary °· Antibiotics are often used to treat infections caused by bacteria. °· Only take   these medicines when needed. °· Let your doctor know if you have watery poop while taking an antibiotic. °· You need to take the entire prescription. °This information is not intended to replace advice given to you by your health care provider. Make sure you discuss any questions you have with your health care provider. °Document Released: 10/16/2007 Document Revised: 07/07/2017 Document Reviewed: 01/09/2016 °Elsevier Interactive Patient Education © 2019 [employer] ° °

## 2018-02-03 NOTE — Discharge Summary (Signed)
Physician Discharge Summary  Angela Page BWL:[id number] DOB: 01/05/1971 DOA: 01/31/2018  PCP: Default, Provider, MD  Admit date: 01/31/2018 Discharge date: 02/03/2018  Admitted From: SNF Disposition:  SNF  Recommendations for Outpatient Follow-up:  1. Follow up with PCP in 1-2 weeks 2. Please obtain BMP/CBC in one week   Home Health: No Equipment/Devices:PEG tube, trach  Discharge Condition: stable CODE STATUS: FULL Diet recommendation: tube feeds, clear liquids by small amounts by mouth  Brief/Interim Summary:  #) Pneumonia: Patient was admitted with low-grade temperatures as well as some shortness of breath.  Initially had a chest x-ray done which showed a possible small right lower lobe infiltrate but procalcitonin was not above 0.5.  Patient had mild leukocytosis.  She was placed on IV cefepime.  Patient does have a history of hospital-acquired pneumonia with Acinetobacter as well as MRSA however her MRSA screen was negative here.  Her cultures were negative here.  She was on IV cefepime and then discharged home on oral ciprofloxacin.  She does additionally have a history of mucous plugging which might have contributed to some shortness of breath.  #): Patient was continued on home midodrine.  #) Anemia/iron deficiency anemia: Patient was given 2 units packed red blood cells.  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  She has a Passy-Muir valve to speak with.  She was seen by speech therapy and they recommended only small amount of water by mouth and to continue tube feeds and to continue to work with speech therapy at her nursing home.  #) Pain/psych: Patient was continued on home fentanyl patch, oxycodone, gabapentin, baclofen.  She is continued on home lorazepam.  #) GERD/constipation: Patient was continued on prokinetics.  #) Sacral decubitus ulcers: Patient was seen by wound care.  #) Type 2 diabetes: Patient  was given long-acting insulin and maintained on sliding scale here.  She may restart her home insulin and be discharged back to the nursing home.  Discharge Diagnoses:  Active Problems:   Sepsis [facility])    Discharge Instructions  Discharge Instructions    Call MD for:  difficulty breathing, headache or visual disturbances   Complete by:  As directed    Call MD for:  hives   Complete by:  As directed    Call MD for:  persistant dizziness or light-headedness   Complete by:  As directed    Call MD for:  persistant nausea and vomiting   Complete by:  As directed    Call MD for:  redness, tenderness, or signs of infection (pain, swelling, redness, odor or green/yellow discharge around incision site)   Complete by:  As directed    Call MD for:  severe uncontrolled pain   Complete by:  As directed    Call MD for:  temperature >100.4   Complete by:  As directed    Diet - low sodium heart healthy   Complete by:  As directed    Discharge instructions   Complete by:  As directed    Please take your antibiotics as prescribed.  Please follow-up with your primary care doctor in 1 week.   Increase activity slowly   Complete by:  As directed      Allergies as of 02/03/2018      Reactions   Erythromycin Nausea And Vomiting   Nitrofurantoin Monohyd Macro Nausea And Vomiting   Oxybutynin    Dries mouth out    Penicillins Hives  Has patient had a PCN reaction causing immediate rash, facial/tongue/throat swelling, SOB or lightheadedness with hypotension: Yes Has patient had a PCN reaction causing severe rash involving mucus membranes or skin necrosis: Yes Has patient had a PCN reaction that required hospitalization No Has patient had a PCN reaction occurring within the last 10 years: No-childhood allergy If all of the above answers are "NO", then may proceed with Cephalosporin       Medication List    STOP taking these medications   acetaminophen 160 MG/5ML solution Commonly known as:   TYLENOL     TAKE these medications   ALPRAZolam 0.25 MG tablet Commonly known as:  XANAX Place 1 tablet (0.25 mg total) into feeding tube 3 (three) times daily as needed for anxiety.   arformoterol 15 MCG/2ML Nebu Commonly known as:  BROVANA Take 2 mLs (15 mcg total) by nebulization 2 (two) times daily.   baclofen 10 MG tablet Commonly known as:  LIORESAL Place 1 tablet (10 mg total) into feeding tube 3 (three) times daily.   BASAGLAR KWIKPEN 100 UNIT/ML Sopn Inject 33 Units into the skin 2 (two) times daily.   budesonide 0.5 MG/2ML nebulizer solution Commonly known as:  PULMICORT Take 2 mLs (0.5 mg total) by nebulization 2 (two) times daily.   ciprofloxacin 500 MG tablet Commonly known as:  CIPRO Take 1 tablet (500 mg total) by mouth 2 (two) times daily for 5 days.   COMPAT ENTERAL FEEDING PUMP Misc 40 mLs by Does not apply route every hour. Continuous Glucerna through G-tube   famotidine 20 MG tablet Commonly known as:  PEPCID Take 20 mg by mouth 2 (two) times daily. Via G-tube   famotidine 40 MG/5ML suspension Commonly known as:  PEPCID Place 2.5 mLs (20 mg total) into feeding tube at bedtime.   fentaNYL 25 MCG/HR Commonly known as:  DURAGESIC Place 25 mcg onto the skin every 3 (three) days.   furosemide 40 MG tablet Commonly known as:  LASIX Place 1 tablet (40 mg total) into feeding tube daily.   gabapentin 300 MG capsule Commonly known as:  NEURONTIN Take 300 mg by mouth 3 (three) times daily. Via G-tube   guaiFENesin 600 MG 12 hr tablet Commonly known as:  MUCINEX Take 600 mg by mouth 2 (two) times daily.   guaiFENesin 100 MG/5ML liquid Commonly known as:  ROBITUSSIN Take 20 mLs by mouth 3 (three) times daily as needed for cough.   Heparin Sodium (Porcine) PF 5000 UNIT/ML Soln Inject 5,000 Units as directed every 8 (eight) hours. Inject 5000 unit subcutaneously every 8 hours for clotting prevention   HUMALOG KWIKPEN 100 UNIT/ML KwikPen Generic drug:   insulin lispro Inject 2-10 Units into the skin every 6 (six) hours. 0-150 0 units 151-200 2 units 201-250 4 units 251-300 6 units 301-350 8 units 351-400 10 units Call MD if Bp lower than 70 or greater than 400   insulin aspart 100 UNIT/ML injection Commonly known as:  novoLOG Inject 10 Units into the skin every 4 (four) hours. For glucose 150 to 200 use 1 units, for glucose 201-250 use 2 units, for 251 to 300 use 4 units, for 301 to 350 use 6 units, for 351 to 400 use 8 units, for 401 or greater use 10 units.   insulin detemir 100 UNIT/ML injection Commonly known as:  LEVEMIR Inject 0.2 mLs (20 Units total) into the skin daily.   ipratropium 0.02 % nebulizer solution Commonly known as:  ATROVENT Take 2.5 mLs (  0.5 mg total) by nebulization every 6 (six) hours.   lactulose 10 GM/15ML solution Commonly known as:  CHRONULAC Place 15 mLs (10 g total) into feeding tube 2 (two) times daily.   levalbuterol 1.25 MG/0.5ML nebulizer solution Commonly known as:  XOPENEX Take 1.25 mg by nebulization every 6 (six) hours.   LORazepam 0.5 MG tablet Commonly known as:  ATIVAN Take 0.5 mg by mouth every 8 (eight) hours as needed for anxiety. Via G-tube   Melatonin 3 MG Tabs Take 2 tablets by mouth at bedtime.   metoCLOPramide 5 MG tablet Commonly known as:  REGLAN Take 5 mg by mouth every 6 (six) hours as needed (GERD). Via G-tube   nicotine 14 mg/24hr patch Commonly known as:  NICODERM CQ - dosed in mg/24 hours Place 14 mg onto the skin daily.   oxybutynin 5 MG tablet Commonly known as:  DITROPAN Place 1 tablet (5 mg total) into feeding tube every 8 (eight) hours as needed for bladder spasms.   oxyCODONE 5 MG/5ML solution Commonly known as:  ROXICODONE Place 5 mLs (5 mg total) into feeding tube 3 (three) times daily. What changed:  Another medication with the same name was removed. Continue taking this medication, and follow the directions you see here.   polyethylene glycol  packet Commonly known as:  MIRALAX / GLYCOLAX Take 17 g by mouth daily. Hold if diarrhea.   PROMOD PO Take 30 mLs by mouth daily.   UNABLE TO FIND Take 10 mg by mouth every 8 (eight) hours. Midodrine HCl 10mg  tablet       Allergies  Allergen Reactions  . Erythromycin Nausea And Vomiting  . Nitrofurantoin Monohyd Macro Nausea And Vomiting  . Oxybutynin     Dries mouth out   . Penicillins Hives    Has patient had a PCN reaction causing immediate rash, facial/tongue/throat swelling, SOB or lightheadedness with hypotension: Yes Has patient had a PCN reaction causing severe rash involving mucus membranes or skin necrosis: Yes Has patient had a PCN reaction that required hospitalization No Has patient had a PCN reaction occurring within the last 10 years: No-childhood allergy If all of the above answers are "NO", then may proceed with Cephalosporin     Consultations:  None   Procedures/Studies: Dg Chest Port 1 View  Result Date: 01/31/2018 CLINICAL DATA:  Shortness of breath. EXAM: PORTABLE CHEST 1 VIEW COMPARISON:  None. FINDINGS: 1737 hours. The cardio pericardial silhouette is enlarged. There is pulmonary vascular congestion without overt pulmonary edema. Similar appearance of the bibasilar atelectasis or infiltrate. Permanent pacemaker noted. Tracheostomy tube evident. Telemetry leads overlie the chest. IMPRESSION: Stable exam. Cardiomegaly with vascular congestion and bibasilar atelectasis or infiltrate. Electronically Signed   By: Misty Stanley M.D.   On: 01/31/2018 18:47   Dg Chest Port 1 View  Result Date: 01/29/2018 CLINICAL DATA:  Cough with chronic left shoulder and neck pain x2 hours. Low oxygen saturations. EXAM: PORTABLE CHEST 1 VIEW COMPARISON:  12/24/2017 FINDINGS: Portable AP semi upright view of the chest. Stable cardiomegaly with mild aortic atherosclerosis and central vascular congestion is noted. Streaky parenchymal opacities at the left lung base likely  represent areas of parenchymal scarring and/or atelectasis. Lesser degree of atelectasis is suggested at the right base. Left-sided pacemaker apparatus with leads in the right atrium and right ventricle appear stable. A tracheostomy tube tip terminates over the upper third of the trachea in satisfactory position. Partially included spinal fusion hardware is noted bilaterally. Left arm catheter projects up  to the axilla. IMPRESSION: 1. Stable cardiomegaly with mild central vascular congestion. Bibasilar atelectasis and/or scarring. 2. Catheter from left arm approach terminates at the level of the axilla. 3. Satisfactory tracheostomy tube position. Electronically Signed   By: Ashley Royalty M.D.   On: 01/29/2018 15:35   Dg Chest Port 1 View  Result Date: 01/23/2018 CLINICAL DATA:  48 year old female with fever and chronic ventilator dependence EXAM: PORTABLE CHEST 1 VIEW COMPARISON:  Prior chest x-ray 01/13/2018 FINDINGS: Tracheostomy tube remains stable in position. The tip is midline and at the level of the clavicles. Stable position of left subclavian approach cardiac rhythm maintenance device with leads projecting over the right atrium and ventricle. Unchanged cardiomegaly. Similar appearance of patchy bibasilar and left mid lung airspace opacities. Background bronchitic changes are also similar. No definite new acute airspace disease. No pneumothorax. No acute osseous abnormality. IMPRESSION: 1. Stable support apparatus. 2. Similar appearance of patchy airspace opacities in both lung bases and the left mid lung compared to 01/13/2018. Differential considerations include persistent multilobar pneumonia versus chronic atelectasis/scarring. 3. Electronically Signed   By: Jacqulynn Cadet M.D.   On: 01/23/2018 11:49   Dg Chest Port 1 View  Result Date: 01/13/2018 CLINICAL DATA:  Fever. Pneumonia. Acute on chronic respiratory failure with hypoxia. EXAM: PORTABLE CHEST 1 VIEW COMPARISON:  01/01/2018 FINDINGS:  Tracheostomy tube and pacemaker remain in place. Stable mild cardiomegaly. Pulmonary hyperinflation again seen, consistent with COPD. Bibasilar scarring is stable. No evidence of acute or superimposed pulmonary infiltrate. No evidence of pleural effusion or pneumothorax. Cervical spine fusion hardware also noted. IMPRESSION: Stable mild cardiomegaly, COPD, and bibasilar scarring. No acute cardiopulmonary abnormality. Electronically Signed   By: Earle Gell M.D.   On: 01/13/2018 17:29   Dg Abd Portable 1v  Result Date: 01/08/2018 CLINICAL DATA:  Ileus EXAM: PORTABLE ABDOMEN - 1 VIEW COMPARISON:  01/04/2018 FINDINGS: Gastrostomy tube projects over the stomach. Mild diffuse gaseous distention of bowel, slightly improved since prior study, likely improving ileus. Liver appears enlarged. No free air or suspicious calcification. IMPRESSION: Mild diffuse gaseous distention of bowel, improved since prior study. Suspicion for hepatomegaly.  Recommend clinical correlation. Electronically Signed   By: Rolm Baptise M.D.   On: 01/08/2018 07:38      Subjective:   Discharge Exam: Vitals:   02/03/18 1300 02/03/18 1400  BP: 127/61 (!) 121/59  Pulse: 83 91  Resp: 10 14  Temp:    SpO2: 100% 100%   Vitals:   02/03/18 1000 02/03/18 1100 02/03/18 1300 02/03/18 1400  BP: 120/73 118/81 127/61 (!) 121/59  Pulse: 90 99 83 91  Resp: 11 14 10 14   Temp:      TempSrc:      SpO2: 100% 98% 100% 100%    General: Pt is alert, awake, not in acute distress Cardiovascular: Regular rate and rhythm, no murmurs Respiratory: CTA bilaterally, no wheezing, no rhonchi, tracheostomy site is clean dry and intact Abdominal: Soft, NT, ND, bowel sounds +, PEG tube site is clean dry and intact Extremities: Bilateral lower paresis, upper extremity contractures and weakness    The results of significant diagnostics from this hospitalization (including imaging, microbiology, ancillary and laboratory) are listed below for  reference.     Microbiology: Recent Results (from the past 240 hour(s))  Culture, blood (routine x 2)     Status: None (Preliminary result)   Collection Time: 01/31/18  5:47 PM  Result Value Ref Range Status   Specimen Description   Final  BLOOD RIGHT HAND Performed at [facility], [place] [address], [place], [place] [address]    Special Requests   Final    BOTTLES DRAWN AEROBIC AND ANAEROBIC Blood Culture adequate volume Performed at [place] [address], [place], [place] [address]    Culture   Final    NO GROWTH 3 DAYS Performed at [facility], [place] [address], [place], [place] [address]    Report Status PENDING  Incomplete  Urine culture     Status: None   Collection Time: 01/31/18  9:02 PM  Result Value Ref Range Status   Specimen Description   Final    URINE, RANDOM Performed at [place] [address], [place], [place] [address]    Special Requests   Final    NONE Performed at [facility], [place] [address], [place], [place] [address]    Culture   Final    Multiple bacterial morphotypes present, none predominant. Suggest appropriate recollection if clinically indicated.   Report Status 02/02/2018 FINAL  Final  Culture, respiratory (non-expectorated)     Status: None   Collection Time: 02/01/18 12:15 AM  Result Value Ref Range Status   Specimen Description   Final    TRACHEAL ASPIRATE Performed at [place] [address] [place], [place] [address]    Special Requests   Final    Normal Performed at [facility], [place] [address] [place], [place] [address]    Gram Stain   Final    FEW WBC PRESENT,BOTH PMN AND MONONUCLEAR NO ORGANISMS SEEN    Culture   Final    NO GROWTH 2 DAYS Performed at [facility], [place] [address], [place],  [address]    Report Status 02/03/2018 FINAL  Final  MRSA PCR Screening     Status:  None   Collection Time: 02/01/18 11:44 AM  Result Value Ref Range Status   MRSA by PCR NEGATIVE NEGATIVE Final    Comment:        The GeneXpert MRSA Assay (FDA approved for NASAL specimens only), is one component of a comprehensive MRSA colonization surveillance program. It is not intended to diagnose MRSA infection nor to guide or monitor treatment for MRSA infections. Performed at [facility], [place] [address], [place],  [address]      Labs: BNP (last 3 results) Recent Labs    12/16/17 0307 02/01/18 2014  BNP 280.4* 98.3   Basic Metabolic Panel: Recent Labs  Lab 01/31/18 1737 01/31/18 1930 02/01/18 0452 02/01/18 1657 02/02/18 0259 02/03/18 0241  NA 137 135 QUESTIONABLE RESULTS, RECOMMEND RECOLLECT TO VERIFY 142 142  141 140  K 3.2* 3.4* QUESTIONABLE RESULTS, RECOMMEND RECOLLECT TO VERIFY 4.1 3.6  3.6 4.8  CL 95* 97* QUESTIONABLE RESULTS, RECOMMEND RECOLLECT TO VERIFY 107 107  107 106  CO2 26  --  QUESTIONABLE RESULTS, RECOMMEND RECOLLECT TO VERIFY 24 25  25 24   GLUCOSE 189* 196* QUESTIONABLE RESULTS, RECOMMEND RECOLLECT TO VERIFY 88 100*  104* 180*  BUN 22* 19 QUESTIONABLE RESULTS, RECOMMEND RECOLLECT TO VERIFY 16 18  18  22*  CREATININE 0.54 0.40* QUESTIONABLE RESULTS, RECOMMEND RECOLLECT TO VERIFY 0.44 0.45  0.40* 0.48  CALCIUM 9.8  --  QUESTIONABLE RESULTS, RECOMMEND RECOLLECT TO VERIFY 8.8* 8.9  9.0 9.2  MG  --   --  QUESTIONABLE RESULTS, RECOMMEND RECOLLECT TO VERIFY 2.1 2.0 2.1  PHOS  --   --   --   --  3.9  --    Liver Function Tests: Recent Labs  Lab 01/29/18 1549 02/02/18 0259  AST 26  --   ALT 23  --   ALKPHOS 163*  --   BILITOT 0.6  --   PROT 8.6*  --   ALBUMIN 3.4* 2.7*   No results for input(s): LIPASE, AMYLASE in the last 168 hours. No results for input(s): AMMONIA in the last 168 hours. CBC: Recent Labs  Lab 01/29/18 1549 01/31/18 1923  02/01/18 1657 02/01/18 2014 02/02/18 0259 02/02/18 2003  02/03/18 0241  WBC 16.9* 15.1*  --  13.0* 13.8* 11.5*  --  14.3*  NEUTROABS 13.6* 10.9*  --  8.6*  --  6.4  --  10.5*  HGB 8.6* 7.3*   < > 7.0* 7.2* 7.1* 9.8* 8.7*  HCT 29.3* 24.6*   < > 24.7* 25.6* 25.0* 33.3* 29.1*  MCV 92.4 92.1  --  96.9 96.2 94.0  --  89.5  PLT 667* 574*  --  503* 525* 537*  --  475*   < > = values in this interval not displayed.   Cardiac Enzymes: No results for input(s): CKTOTAL, CKMB, CKMBINDEX, TROPONINI in the last 168 hours. BNP: Invalid input(s): POCBNP CBG: Recent Labs  Lab 02/02/18 1134 02/02/18 1738 02/02/18 2333 02/03/18 0605 02/03/18 1153  GLUCAP 180* 125* 144* 174* 226*   D-Dimer No results for input(s): DDIMER in the last 72 hours. Hgb A1c No results for input(s): HGBA1C in the last 72 hours. Lipid Profile No results for input(s): CHOL, HDL, LDLCALC, TRIG, CHOLHDL, LDLDIRECT in the last 72 hours. Thyroid function studies No results for input(s): TSH, T4TOTAL, T3FREE, THYROIDAB in the last 72 hours.  Invalid input(s): FREET3 Anemia work up No results for input(s): VITAMINB12, FOLATE, FERRITIN, TIBC, IRON, RETICCTPCT in the last 72 hours. Urinalysis    Component Value Date/Time   COLORURINE YELLOW 01/31/2018 2102   APPEARANCEUR CLOUDY (A) 01/31/2018 2102   LABSPEC 1.016 01/31/2018 2102   PHURINE 7.0 01/31/2018 2102   GLUCOSEU NEGATIVE 01/31/2018 2102   GLUCOSEU NEGATIVE 04/28/2017 1121   HGBUR MODERATE (A) 01/31/2018 2102   BILIRUBINUR NEGATIVE 01/31/2018 2102   KETONESUR NEGATIVE 01/31/2018 2102   PROTEINUR 100 (A) 01/31/2018 2102   UROBILINOGEN 0.2 04/28/2017 1121   NITRITE NEGATIVE 01/31/2018 2102   LEUKOCYTESUR LARGE (A) 01/31/2018 2102   Sepsis Labs Invalid input(s): PROCALCITONIN,  WBC,  LACTICIDVEN Microbiology Recent Results (from the past 240 hour(s))  Culture, blood (routine x 2)     Status: None (Preliminary result)   Collection Time: 01/31/18  5:47 PM  Result Value Ref Range Status   Specimen Description   Final     BLOOD RIGHT HAND Performed at [facility], [place] [address], [place], [place] [address]    Special Requests   Final    BOTTLES DRAWN AEROBIC AND ANAEROBIC Blood Culture adequate volume Performed at [facility] [address] [place], [place] [address]    Culture   Final    NO GROWTH 3 DAYS Performed at [facility], [place] [address] [place], [place] [address]    Report Status PENDING  Incomplete  Urine culture     Status: None   Collection Time: 01/31/18  9:02 PM  Result Value Ref Range Status   Specimen Description   Final    URINE, RANDOM Performed at [facility] [address] [place], [place] [address]    Special Requests   Final  NONE Performed at [facility], [place] [address], [place], [place] [address]    Culture   Final    Multiple bacterial morphotypes present, none predominant. Suggest appropriate recollection if clinically indicated.   Report Status 02/02/2018 FINAL  Final  Culture, respiratory (non-expectorated)     Status: None   Collection Time: 02/01/18 12:15 AM  Result Value Ref Range Status   Specimen Description   Final    TRACHEAL ASPIRATE Performed at [facility] [address] [place], [place] [address]    Special Requests   Final    Normal Performed at [facility], [place] [address] [place], [place] [address]    Gram Stain   Final    FEW WBC PRESENT,BOTH PMN AND MONONUCLEAR NO ORGANISMS SEEN    Culture   Final    NO GROWTH 2 DAYS Performed at [facility], [place] [address], [place], [place] [address]    Report Status 02/03/2018 FINAL  Final  MRSA PCR Screening     Status: None   Collection Time: 02/01/18 11:44 AM  Result Value Ref Range Status   MRSA by PCR NEGATIVE NEGATIVE Final    Comment:        The GeneXpert MRSA Assay (FDA approved for NASAL specimens only), is one component of a comprehensive MRSA  colonization surveillance program. It is not intended to diagnose MRSA infection nor to guide or monitor treatment for MRSA infections. Performed at [facility], [place] [address] [place], [place] [address]      Time coordinating discharge: 42  SIGNED:   Cristy Folks, MD  [facility] 02/03/2018, 2:21 PM  If 7PM-7AM, please contact night-coverage [email or web address] Password [email or web address]

## 2018-02-05 LAB — CULTURE, BLOOD (ROUTINE X 2)
Culture: NO GROWTH
Special Requests: ADEQUATE

## 2018-02-08 ENCOUNTER — Telehealth: Payer: Self-pay

## 2018-02-08 NOTE — Telephone Encounter (Signed)
Filled out form - stating that we have not seen her though visit has been requested by [name].

## 2018-02-08 NOTE — Telephone Encounter (Signed)
PPW received from [facility]. Last time we spoke with patients mother informed we will not longer be able to fill out any ppw without evaluation. Patient mother agreed would have done by provider she was being treated by.

## 2018-02-12 ENCOUNTER — Other Ambulatory Visit: Payer: Self-pay | Admitting: Neurology

## 2018-02-12 IMAGING — CR DG CHEST 2V
2 series · 2 of 2 positions shown · non-contrast
Comparison: Portable chest x-ray November 23, 2015

CLINICAL DATA: Shortness of breath.  History of asthma and smoking.

EXAM:
CHEST  2 VIEW

[w chest lat]
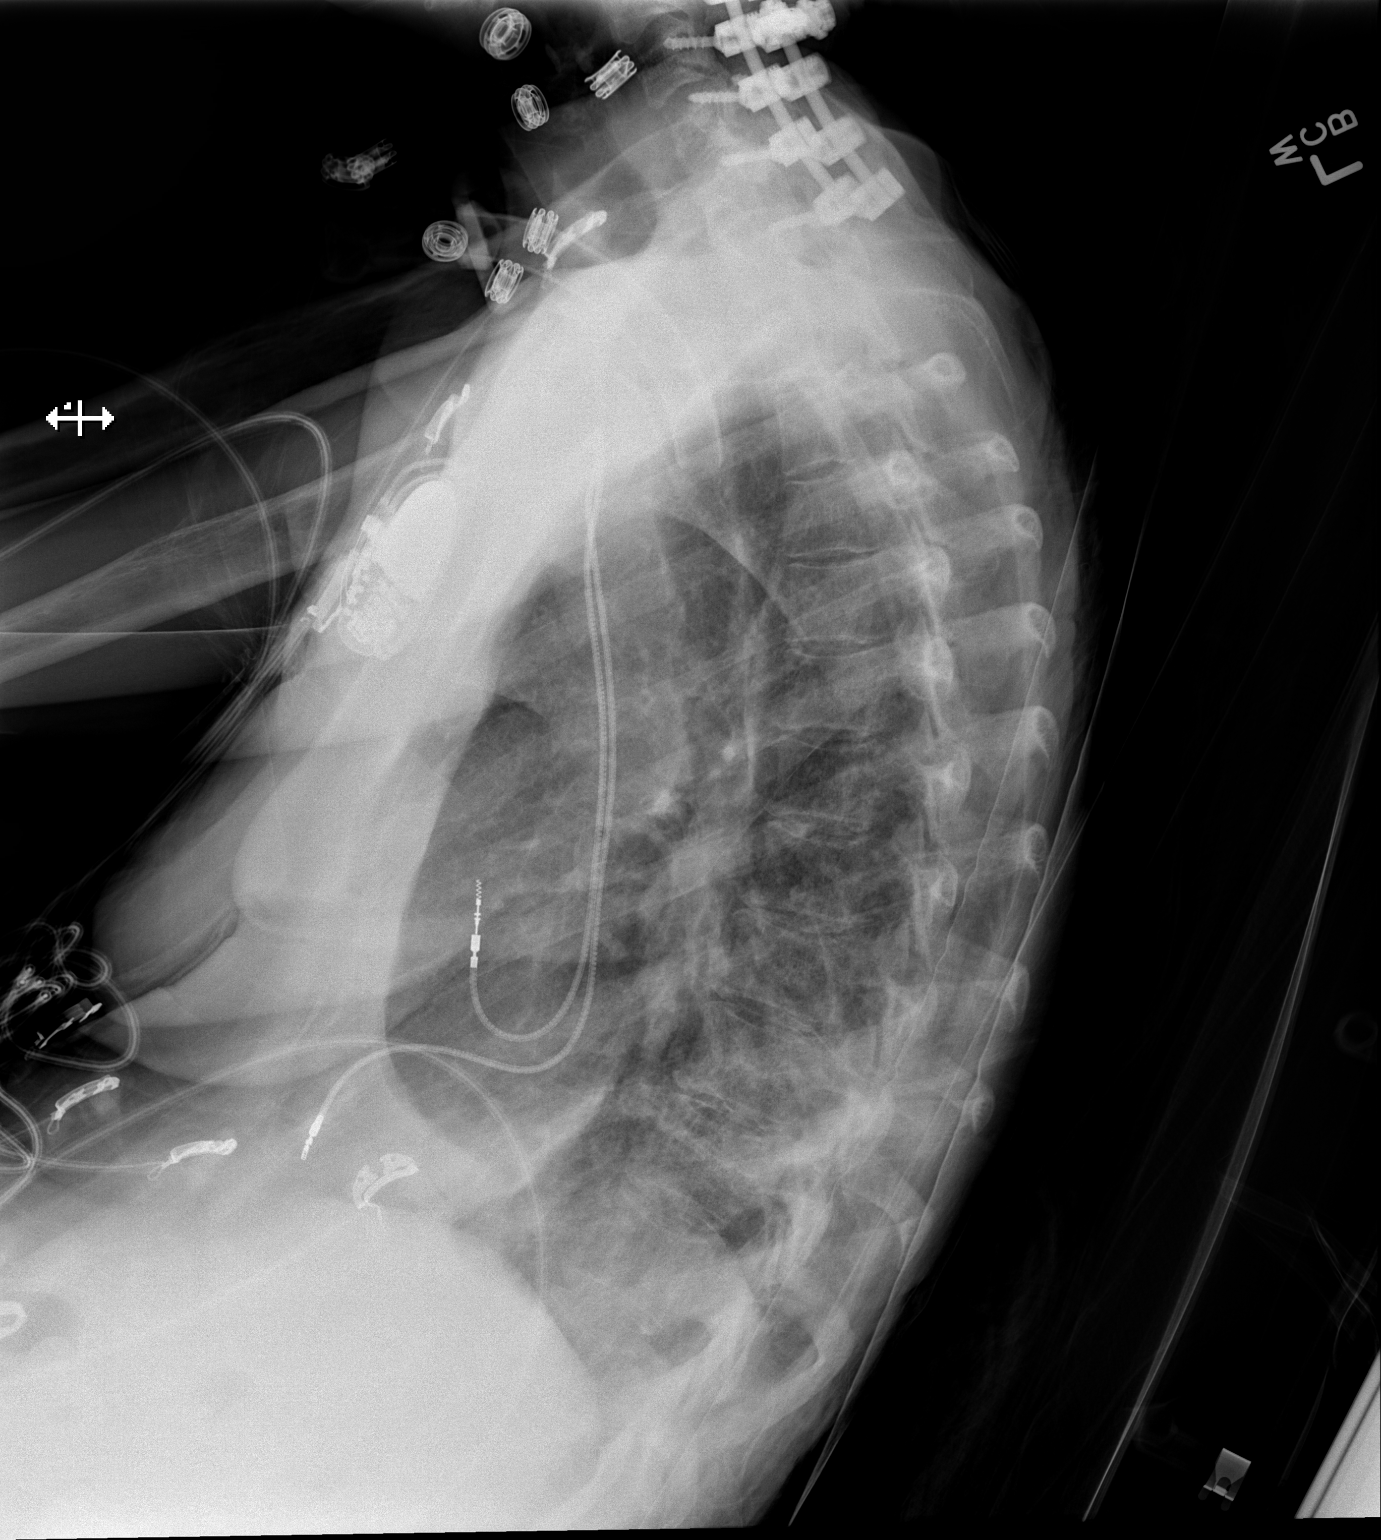

[x chest ap]
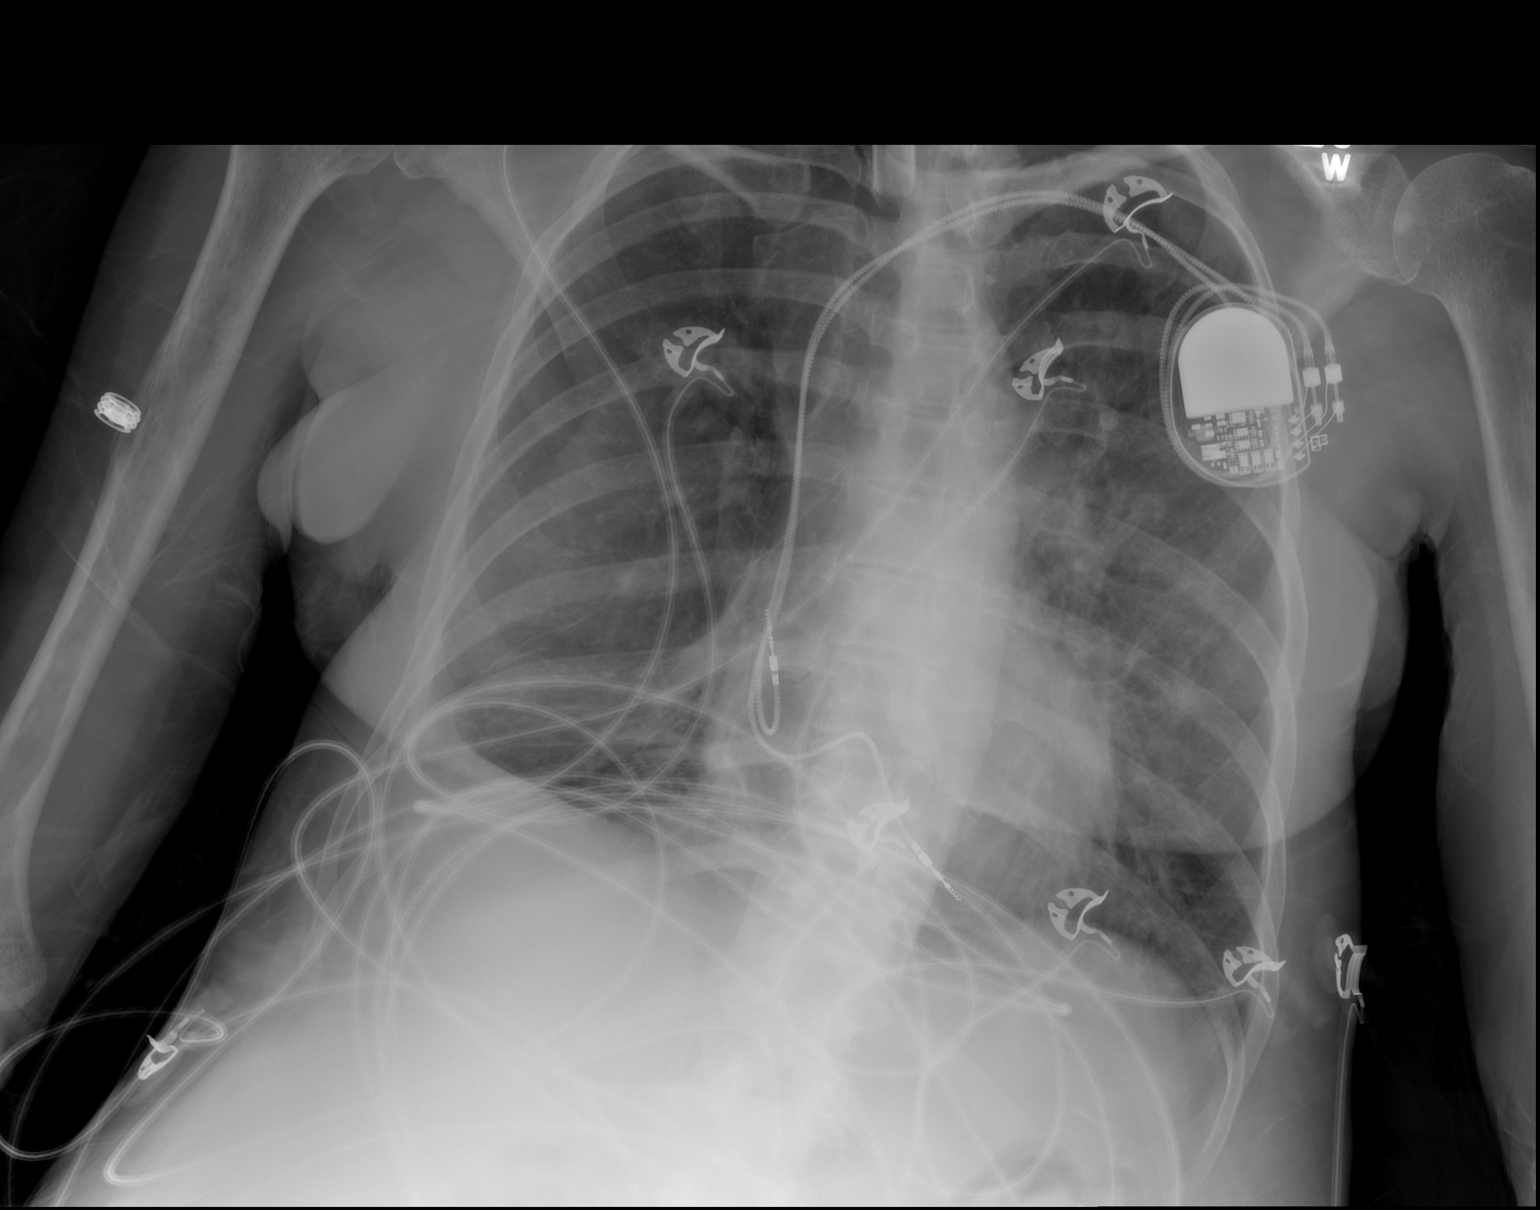

[2 of 2 positions shown; findings below may reference images not displayed]

FINDINGS: The lungs are well-expanded. The interstitial markings of both lungs
are increased. There is patchy increased density at the right lung
base posteriorly. The heart and pulmonary vascularity are normal.
The tracheostomy appliance tip projects between the clavicular
heads. The pacemaker electrodes are in reasonable position
radiographically.
IMPRESSION: Mild hyperinflation consistent with known reactive airway disease.
Increased lung markings at both lung bases may reflect atelectasis
or early pneumonia. There is pleural thickening versus pleural fluid
at the right lung base. No evidence of CHF.

## 2018-02-12 NOTE — Telephone Encounter (Signed)
The prescription for gabapentin was sent in.  The gabapentin was not discontinued on the recent hospitalization.

## 2018-02-12 NOTE — Telephone Encounter (Signed)
This medication was discontinued after pt's hospitalization. Will send to Dr. Jannifer Franklin for review.

## 2018-02-13 ENCOUNTER — Other Ambulatory Visit: Payer: Self-pay | Admitting: Family Medicine

## 2018-02-14 ENCOUNTER — Encounter (HOSPITAL_COMMUNITY): Payer: Self-pay

## 2018-02-14 ENCOUNTER — Inpatient Hospital Stay (HOSPITAL_COMMUNITY)
Admission: EM | Admit: 2018-02-14 | Discharge: 2018-02-18 | DRG: 698 | Disposition: A | Discharge status: Skilled Nursing Facility | Payer: Medicare Other | Attending: Family Medicine | Admitting: Family Medicine

## 2018-02-14 ENCOUNTER — Emergency Department (HOSPITAL_COMMUNITY): Payer: Medicare Other

## 2018-02-14 DIAGNOSIS — E876 Hypokalemia: Secondary | ICD-10-CM | POA: Diagnosis present

## 2018-02-14 DIAGNOSIS — T404X5A Adverse effect of other synthetic narcotics, initial encounter: Secondary | ICD-10-CM | POA: Diagnosis present

## 2018-02-14 DIAGNOSIS — Z9911 Dependence on respirator [ventilator] status: Secondary | ICD-10-CM

## 2018-02-14 DIAGNOSIS — N318 Other neuromuscular dysfunction of bladder: Secondary | ICD-10-CM | POA: Diagnosis present

## 2018-02-14 DIAGNOSIS — Z978 Presence of other specified devices: Secondary | ICD-10-CM

## 2018-02-14 DIAGNOSIS — J449 Chronic obstructive pulmonary disease, unspecified: Secondary | ICD-10-CM | POA: Diagnosis present

## 2018-02-14 DIAGNOSIS — R748 Abnormal levels of other serum enzymes: Secondary | ICD-10-CM

## 2018-02-14 DIAGNOSIS — G8254 Quadriplegia, C5-C7 incomplete: Secondary | ICD-10-CM | POA: Diagnosis present

## 2018-02-14 DIAGNOSIS — J9611 Chronic respiratory failure with hypoxia: Secondary | ICD-10-CM | POA: Diagnosis present

## 2018-02-14 DIAGNOSIS — Z9049 Acquired absence of other specified parts of digestive tract: Secondary | ICD-10-CM

## 2018-02-14 DIAGNOSIS — L89314 Pressure ulcer of right buttock, stage 4: Secondary | ICD-10-CM | POA: Diagnosis present

## 2018-02-14 DIAGNOSIS — Z881 Allergy status to other antibiotic agents status: Secondary | ICD-10-CM

## 2018-02-14 DIAGNOSIS — Z7951 Long term (current) use of inhaled steroids: Secondary | ICD-10-CM

## 2018-02-14 DIAGNOSIS — Z79891 Long term (current) use of opiate analgesic: Secondary | ICD-10-CM

## 2018-02-14 DIAGNOSIS — Z930 Tracheostomy status: Secondary | ICD-10-CM

## 2018-02-14 DIAGNOSIS — D638 Anemia in other chronic diseases classified elsewhere: Secondary | ICD-10-CM | POA: Diagnosis present

## 2018-02-14 DIAGNOSIS — Z841 Family history of disorders of kidney and ureter: Secondary | ICD-10-CM

## 2018-02-14 DIAGNOSIS — Z800 Family history of malignant neoplasm of digestive organs: Secondary | ICD-10-CM

## 2018-02-14 DIAGNOSIS — E440 Moderate protein-calorie malnutrition: Secondary | ICD-10-CM | POA: Diagnosis present

## 2018-02-14 DIAGNOSIS — N390 Urinary tract infection, site not specified: Secondary | ICD-10-CM

## 2018-02-14 DIAGNOSIS — D72829 Elevated white blood cell count, unspecified: Secondary | ICD-10-CM | POA: Diagnosis present

## 2018-02-14 DIAGNOSIS — Z8249 Family history of ischemic heart disease and other diseases of the circulatory system: Secondary | ICD-10-CM

## 2018-02-14 DIAGNOSIS — I495 Sick sinus syndrome: Secondary | ICD-10-CM | POA: Diagnosis present

## 2018-02-14 DIAGNOSIS — B3749 Other urogenital candidiasis: Secondary | ICD-10-CM | POA: Diagnosis present

## 2018-02-14 DIAGNOSIS — Y846 Urinary catheterization as the cause of abnormal reaction of the patient, or of later complication, without mention of misadventure at the time of the procedure: Secondary | ICD-10-CM | POA: Diagnosis present

## 2018-02-14 DIAGNOSIS — Z960 Presence of urogenital implants: Secondary | ICD-10-CM | POA: Diagnosis present

## 2018-02-14 DIAGNOSIS — N319 Neuromuscular dysfunction of bladder, unspecified: Secondary | ICD-10-CM | POA: Diagnosis present

## 2018-02-14 DIAGNOSIS — Z79890 Hormone replacement therapy (postmenopausal): Secondary | ICD-10-CM

## 2018-02-14 DIAGNOSIS — E119 Type 2 diabetes mellitus without complications: Secondary | ICD-10-CM | POA: Diagnosis present

## 2018-02-14 DIAGNOSIS — Z794 Long term (current) use of insulin: Secondary | ICD-10-CM

## 2018-02-14 DIAGNOSIS — G9340 Encephalopathy, unspecified: Secondary | ICD-10-CM | POA: Diagnosis present

## 2018-02-14 DIAGNOSIS — R4182 Altered mental status, unspecified: Secondary | ICD-10-CM | POA: Diagnosis present

## 2018-02-14 DIAGNOSIS — G8921 Chronic pain due to trauma: Secondary | ICD-10-CM | POA: Diagnosis present

## 2018-02-14 DIAGNOSIS — B965 Pseudomonas (aeruginosa) (mallei) (pseudomallei) as the cause of diseases classified elsewhere: Secondary | ICD-10-CM | POA: Diagnosis present

## 2018-02-14 DIAGNOSIS — G9341 Metabolic encephalopathy: Secondary | ICD-10-CM | POA: Diagnosis not present

## 2018-02-14 DIAGNOSIS — Z7401 Bed confinement status: Secondary | ICD-10-CM

## 2018-02-14 DIAGNOSIS — A419 Sepsis, unspecified organism: Secondary | ICD-10-CM | POA: Diagnosis present

## 2018-02-14 DIAGNOSIS — Z801 Family history of malignant neoplasm of trachea, bronchus and lung: Secondary | ICD-10-CM

## 2018-02-14 DIAGNOSIS — F17210 Nicotine dependence, cigarettes, uncomplicated: Secondary | ICD-10-CM | POA: Diagnosis present

## 2018-02-14 DIAGNOSIS — Z880 Allergy status to penicillin: Secondary | ICD-10-CM

## 2018-02-14 DIAGNOSIS — L89154 Pressure ulcer of sacral region, stage 4: Secondary | ICD-10-CM | POA: Diagnosis present

## 2018-02-14 DIAGNOSIS — I9589 Other hypotension: Secondary | ICD-10-CM | POA: Diagnosis present

## 2018-02-14 DIAGNOSIS — Z9981 Dependence on supplemental oxygen: Secondary | ICD-10-CM | POA: Diagnosis not present

## 2018-02-14 DIAGNOSIS — Z981 Arthrodesis status: Secondary | ICD-10-CM

## 2018-02-14 DIAGNOSIS — Z8619 Personal history of other infectious and parasitic diseases: Secondary | ICD-10-CM

## 2018-02-14 DIAGNOSIS — Z87442 Personal history of urinary calculi: Secondary | ICD-10-CM

## 2018-02-14 DIAGNOSIS — N3000 Acute cystitis without hematuria: Secondary | ICD-10-CM | POA: Diagnosis present

## 2018-02-14 DIAGNOSIS — T83511A Infection and inflammatory reaction due to indwelling urethral catheter, initial encounter: Principal | ICD-10-CM | POA: Diagnosis present

## 2018-02-14 DIAGNOSIS — Z8701 Personal history of pneumonia (recurrent): Secondary | ICD-10-CM

## 2018-02-14 DIAGNOSIS — Z6828 Body mass index (BMI) 28.0-28.9, adult: Secondary | ICD-10-CM

## 2018-02-14 DIAGNOSIS — G92 Toxic encephalopathy: Secondary | ICD-10-CM | POA: Diagnosis present

## 2018-02-14 DIAGNOSIS — R1310 Dysphagia, unspecified: Secondary | ICD-10-CM | POA: Diagnosis present

## 2018-02-14 DIAGNOSIS — K219 Gastro-esophageal reflux disease without esophagitis: Secondary | ICD-10-CM | POA: Diagnosis present

## 2018-02-14 DIAGNOSIS — G8220 Paraplegia, unspecified: Secondary | ICD-10-CM

## 2018-02-14 DIAGNOSIS — E873 Alkalosis: Secondary | ICD-10-CM | POA: Diagnosis present

## 2018-02-14 DIAGNOSIS — E669 Obesity, unspecified: Secondary | ICD-10-CM | POA: Diagnosis present

## 2018-02-14 DIAGNOSIS — E43 Unspecified severe protein-calorie malnutrition: Secondary | ICD-10-CM | POA: Diagnosis present

## 2018-02-14 DIAGNOSIS — L89324 Pressure ulcer of left buttock, stage 4: Secondary | ICD-10-CM | POA: Diagnosis present

## 2018-02-14 DIAGNOSIS — G8250 Quadriplegia, unspecified: Secondary | ICD-10-CM | POA: Diagnosis present

## 2018-02-14 DIAGNOSIS — Z79899 Other long term (current) drug therapy: Secondary | ICD-10-CM

## 2018-02-14 DIAGNOSIS — Z950 Presence of cardiac pacemaker: Secondary | ICD-10-CM | POA: Diagnosis present

## 2018-02-14 DIAGNOSIS — Z888 Allergy status to other drugs, medicaments and biological substances status: Secondary | ICD-10-CM

## 2018-02-14 DIAGNOSIS — Z931 Gastrostomy status: Secondary | ICD-10-CM

## 2018-02-14 HISTORY — DX: Type 2 diabetes mellitus without complications: E11.9

## 2018-02-14 LAB — CBC WITH DIFFERENTIAL/PLATELET
Abs Immature Granulocytes: 0.09 10*3/uL — ABNORMAL HIGH (ref 0.00–0.07)
Basophils Absolute: 0.1 10*3/uL (ref 0.0–0.1)
Basophils Relative: 0 %
Eosinophils Absolute: 0.6 10*3/uL — ABNORMAL HIGH (ref 0.0–0.5)
Eosinophils Relative: 3 %
HCT: 32.3 % — ABNORMAL LOW (ref 36.0–46.0)
Hemoglobin: 9.8 g/dL — ABNORMAL LOW (ref 12.0–15.0)
Immature Granulocytes: 1 %
Lymphocytes Relative: 7 %
Lymphs Abs: 1.4 10*3/uL (ref 0.7–4.0)
MCH: 26.1 pg (ref 26.0–34.0)
MCHC: 30.3 g/dL (ref 30.0–36.0)
MCV: 85.9 fL (ref 80.0–100.0)
Monocytes Absolute: 1.3 10*3/uL — ABNORMAL HIGH (ref 0.1–1.0)
Monocytes Relative: 7 %
Neutro Abs: 16.2 10*3/uL — ABNORMAL HIGH (ref 1.7–7.7)
Neutrophils Relative %: 82 %
Platelets: 656 10*3/uL — ABNORMAL HIGH (ref 150–400)
RBC: 3.76 MIL/uL — ABNORMAL LOW (ref 3.87–5.11)
RDW: 16.4 % — ABNORMAL HIGH (ref 11.5–15.5)
WBC: 19.5 10*3/uL — ABNORMAL HIGH (ref 4.0–10.5)
nRBC: 0 % (ref 0.0–0.2)

## 2018-02-14 LAB — GLUCOSE, CAPILLARY
Glucose-Capillary: 160 mg/dL — ABNORMAL HIGH (ref 70–99)
Glucose-Capillary: 131 mg/dL — ABNORMAL HIGH (ref 70–99)
Glucose-Capillary: 98 mg/dL (ref 70–99)
Glucose-Capillary: 123 mg/dL — ABNORMAL HIGH (ref 70–99)

## 2018-02-14 LAB — POCT I-STAT 7, (LYTES, BLD GAS, ICA,H+H)
Acid-Base Excess: 18 mmol/L — ABNORMAL HIGH (ref 0.0–2.0)
Bicarbonate: 43.4 mmol/L — ABNORMAL HIGH (ref 20.0–28.0)
Calcium, Ion: 1.21 mmol/L (ref 1.15–1.40)
HCT: 29 % — ABNORMAL LOW (ref 36.0–46.0)
Hemoglobin: 9.9 g/dL — ABNORMAL LOW (ref 12.0–15.0)
O2 Saturation: 99 %
PCO2 ART: 52.5 mmHg — AB (ref 32.0–48.0)
Patient temperature: 98.6
Potassium: 3.3 mmol/L — ABNORMAL LOW (ref 3.5–5.1)
Sodium: 129 mmol/L — ABNORMAL LOW (ref 135–145)
TCO2: 45 mmol/L — ABNORMAL HIGH (ref 22–32)
pH, Arterial: 7.525 — ABNORMAL HIGH (ref 7.350–7.450)
pO2, Arterial: 120 mmHg — ABNORMAL HIGH (ref 83.0–108.0)

## 2018-02-14 LAB — LACTIC ACID, PLASMA
Lactic Acid, Venous: 1 mmol/L (ref 0.5–1.9)
Lactic Acid, Venous: 0.9 mmol/L (ref 0.5–1.9)

## 2018-02-14 LAB — URINALYSIS, ROUTINE W REFLEX MICROSCOPIC
Bacteria, UA: NONE SEEN
Bilirubin Urine: NEGATIVE
Glucose, UA: 50 mg/dL — AB
Ketones, ur: NEGATIVE mg/dL
Nitrite: NEGATIVE
Protein, ur: 100 mg/dL — AB
Specific Gravity, Urine: 1.023 (ref 1.005–1.030)
WBC, UA: >50 WBC/hpf — ABNORMAL HIGH (ref 0–5)
pH: 7 (ref 5.0–8.0)

## 2018-02-14 LAB — COMPREHENSIVE METABOLIC PANEL
ALK PHOS: 145 U/L — AB (ref 38–126)
ALT: 17 U/L (ref 0–44)
AST: 18 U/L (ref 15–41)
Albumin: 2.8 g/dL — ABNORMAL LOW (ref 3.5–5.0)
Anion gap: 14 (ref 5–15)
BILIRUBIN TOTAL: 0.6 mg/dL (ref 0.3–1.2)
BUN: 18 mg/dL (ref 6–20)
CO2: 35 mmol/L — ABNORMAL HIGH (ref 22–32)
Calcium: 10.1 mg/dL (ref 8.9–10.3)
Chloride: 83 mmol/L — ABNORMAL LOW (ref 98–111)
Creatinine, Ser: 0.52 mg/dL (ref 0.44–1.00)
GFR calc Af Amer: >60 mL/min (ref >60)
GFR calc non Af Amer: >60 mL/min (ref >60)
Glucose, Bld: 224 mg/dL — ABNORMAL HIGH (ref 70–99)
Potassium: 3.3 mmol/L — ABNORMAL LOW (ref 3.5–5.1)
Sodium: 132 mmol/L — ABNORMAL LOW (ref 135–145)
TOTAL PROTEIN: 8.4 g/dL — AB (ref 6.5–8.1)

## 2018-02-14 LAB — AMMONIA: AMMONIA: 37 umol/L — AB (ref 9–35)

## 2018-02-14 LAB — LIPASE, BLOOD: Lipase: 120 U/L — ABNORMAL HIGH (ref 11–51)

## 2018-02-14 LAB — STREP PNEUMONIAE URINARY ANTIGEN: STREP PNEUMO URINARY ANTIGEN: NEGATIVE

## 2018-02-14 LAB — CBG MONITORING, ED: Glucose-Capillary: 192 mg/dL — ABNORMAL HIGH (ref 70–99)

## 2018-02-14 MED ORDER — LACTATED RINGERS IV BOLUS
1000.0000 mL | Freq: Once | INTRAVENOUS | Status: AC
  Administered 2018-02-14: 1000 mL via INTRAVENOUS

## 2018-02-14 MED ORDER — ACETAMINOPHEN 325 MG PO TABS
650.0000 mg | ORAL_TABLET | Freq: Four times a day (QID) | ORAL | Status: DC | PRN
  Administered 2018-02-14 – 2018-02-15 (×2): 650 mg via ORAL
  Filled 2018-02-14: qty 2

## 2018-02-14 MED ORDER — ONDANSETRON HCL 4 MG PO TABS: 4.0000 mg | ORAL_TABLET | Freq: Four times a day (QID) | ORAL | Status: DC | PRN

## 2018-02-14 MED ORDER — ACETAMINOPHEN 160 MG/5ML PO SOLN
650.0000 mg | Freq: Once | ORAL | Status: AC
  Administered 2018-02-14: 650 mg
  Filled 2018-02-14: qty 20.3

## 2018-02-14 MED ORDER — NON FORMULARY: 3.0000 mg | Freq: Once | Status: DC

## 2018-02-14 MED ORDER — JEVITY 1.2 CAL PO LIQD
1000.0000 mL | ORAL | Status: DC
  Administered 2018-02-14: 1000 mL
  Filled 2018-02-14 (×2): qty 1000

## 2018-02-14 MED ORDER — INSULIN ASPART 100 UNIT/ML ~~LOC~~ SOLN
0.0000 [IU] | SUBCUTANEOUS | Status: DC
  Administered 2018-02-14: 2 [IU] via SUBCUTANEOUS
  Administered 2018-02-14: 1 [IU] via SUBCUTANEOUS
  Administered 2018-02-15: 2 [IU] via SUBCUTANEOUS
  Administered 2018-02-15 (×3): 1 [IU] via SUBCUTANEOUS

## 2018-02-14 MED ORDER — ACETAMINOPHEN 650 MG RE SUPP: 650.0000 mg | Freq: Four times a day (QID) | RECTAL | Status: DC | PRN

## 2018-02-14 MED ORDER — INSULIN DETEMIR 100 UNIT/ML ~~LOC~~ SOLN
10.0000 [IU] | Freq: Every day | SUBCUTANEOUS | Status: DC
  Administered 2018-02-14 – 2018-02-17 (×4): 10 [IU] via SUBCUTANEOUS
  Filled 2018-02-14 (×5): qty 0.1

## 2018-02-14 MED ORDER — SODIUM CHLORIDE 0.9 % IV SOLN
INTRAVENOUS | Status: DC
  Administered 2018-02-14 – 2018-02-15 (×2): via INTRAVENOUS

## 2018-02-14 MED ORDER — SODIUM CHLORIDE 0.9 % IV SOLN
1.0000 g | Freq: Three times a day (TID) | INTRAVENOUS | Status: DC
  Administered 2018-02-14 – 2018-02-17 (×10): 1 g via INTRAVENOUS
  Filled 2018-02-14 (×11): qty 1

## 2018-02-14 MED ORDER — HEPARIN SODIUM (PORCINE) 5000 UNIT/ML IJ SOLN
5000.0000 [IU] | Freq: Three times a day (TID) | INTRAMUSCULAR | Status: DC
  Administered 2018-02-14 – 2018-02-18 (×11): 5000 [IU] via SUBCUTANEOUS
  Filled 2018-02-14 (×11): qty 1

## 2018-02-14 MED ORDER — ALBUTEROL SULFATE (2.5 MG/3ML) 0.083% IN NEBU
2.5000 mg | INHALATION_SOLUTION | RESPIRATORY_TRACT | Status: DC | PRN
  Administered 2018-02-14: 2.5 mg via RESPIRATORY_TRACT
  Filled 2018-02-14: qty 3

## 2018-02-14 MED ORDER — MELATONIN 3 MG PO TABS
3.0000 mg | ORAL_TABLET | Freq: Once | ORAL | Status: AC
  Administered 2018-02-15: 3 mg via ORAL
  Filled 2018-02-14: qty 1

## 2018-02-14 MED ORDER — ONDANSETRON HCL 4 MG/2ML IJ SOLN: 4.0000 mg | Freq: Four times a day (QID) | INTRAMUSCULAR | Status: DC | PRN

## 2018-02-14 NOTE — ED Provider Notes (Signed)
[facility] EMERGENCY DEPARTMENT Provider Note   CSN: [id number] Arrival date & time: 02/14/18  4098     History   Chief Complaint Chief Complaint  Patient presents with  . Altered Mental Status    HPI Angela Page is a 48 y.o. female.  Level 5 caveat due to altered mental status.  Patient per family member started to be altered last night.  They thought possibly related to insulin, possible infection.  She received some insulin from her home health nurse this morning and decided to bring her for evaluation as she continued to be slightly altered and repeating phrases such as oh God.  Patient has suprapubic catheter, feeding tube, chronic trach on 3 L of oxygen by nasal cannula at baseline.  The history is provided by the patient.  Altered Mental Status  Presenting symptoms: disorientation   Severity:  Mild Most recent episode:  Today Episode history:  Single Timing:  Constant Progression:  Unchanged Chronicity:  New Context: recent infection     Past Medical History:  Diagnosis Date  . Acute on chronic respiratory failure with hypoxia ([place])   . Anasarca 06/10/2016  . Asthma   . At high risk for severe sepsis   . Cocaine use 10/08/2013  . Decubitus ulcer of buttock, unstageable ([place])   . Depression   . Diabetes mellitus without complication ([place])   . GERD (gastroesophageal reflux disease)   . HCAP (healthcare-associated pneumonia) 06/09/2016  . Hepatitis    hx of hepatits frm mono   . Kidney stone   . Lobar pneumonia ([place])   . Overdose of opiate or related narcotic ([place]) 09/02/2014  . Pacemaker   . Pleurisy   . Polysubstance dependence including opioid type drug, episodic abuse ([place]) 10/27/2013  . Protein calorie malnutrition ([place]) 10/31/2015  . Quadriparesis ([place])   . Quadriplegia and quadriparesis ([place])   . Quadriplegia, C5-C7 incomplete ([place]) 10/08/2013  . Stage IV pressure ulcer of sacral region ([place]) 10/31/2015  . Tracheostomy  status [facility])     Patient Active Problem List   Diagnosis Date Noted  . Encephalopathy acute 02/14/2018  . Sepsis ([place]) 01/31/2018  . Acute on chronic respiratory failure with hypoxia ([place])   . Quadriplegia and quadriparesis ([place])   . Lobar pneumonia ([address])   . At high risk for severe sepsis   . Tracheostomy status ([place])   . Decubitus ulcer of buttock, unstageable ([place])   . Hyperglycemic hyperosmolar nonketotic coma ([place]) 12/16/2017  . Lactic acid acidosis 12/16/2017  . AKI (acute kidney injury) ([place]) 12/16/2017  . Suprapubic catheter ([place]) 12/16/2017  . [place] (hyperglycemic hyperosmolar nonketotic coma) ([place]) 12/16/2017  . Elevated lactic acid level   . Pressure injury of skin 07/17/2017  . Kidney stone 07/16/2017  . Right ureteral stone 03/10/2017  . UTI (urinary tract infection) 03/09/2017  . Ineffective airway clearance   . Sepsis secondary to UTI ([place]) 06/30/2016  . Autonomic neuropathy 06/10/2016  . Presence of permanent cardiac pacemaker 06/10/2016  . 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 Chronically on opiate therapy 06/10/2016  . Indwelling Foley catheter present 06/10/2016  . PEG (percutaneous endoscopic gastrostomy) status ([place]) 06/10/2016  . Pulmonary hypertension ([place]) 06/10/2016  . Hypoalbuminemia 06/10/2016  . Abnormal transaminases   . Peripheral edema   . Tracheostomy dependence ([place])   . Chronic obstructive pulmonary disease ([place])   . Chronic diastolic CHF (congestive heart failure) ([place]) 01/23/2016  . Tracheostomy status ([place])   . Esophageal dysphagia   .  Malnutrition of moderate degree 11/01/2015  . Tobacco abuse 10/31/2015  . Asthma 10/31/2015  . History of pneumonia 10/08/2015  . Pressure ulcer of contiguous region involving buttock and hip, stage 4 ([place]) 02/26/2015  . Hypotension 02/25/2015  . Acute metabolic encephalopathy 92/42/6834  . Leukocytosis 10/23/2014  . Hypokalemia 10/23/2014  . Decubitus ulcer of sacral region, stage 4  ([place]) 10/23/2014  . Anemia, iron deficiency 10/23/2014  . Quadriplegia, C5-C7 incomplete ([place]) 10/22/2014  . Depression   . Protein-calorie malnutrition, severe ([place]) 09/05/2014  . 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 Recurrent UTI 03/31/2014  . Pressure ulcer of coccygeal region, stage 4 ([place]) 01/06/2014  . Stage 4 skin ulcer of sacral region ([place]) 01/06/2014  . Neuropathic pain 11/30/2013  . Paraplegia following spinal cord injury ([place]) 11/30/2013  . Spinal cord injury, cervical region ([place]) 11/30/2013  . Chronic pain due to injury 11/30/2013  . S/P cervical spinal fusion 10/27/2013  . Tracheostomy care ([place]) 10/27/2013  . 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 SCI (spinal cord injury) 10/11/2013    Past Surgical History:  Procedure Laterality Date  . APPENDECTOMY    . BACK SURGERY  10/08/2013   fusion of spine, cervical region  . CARDIAC SURGERY    . CYSTOSCOPY W/ URETERAL STENT PLACEMENT Right 03/09/2017   Procedure: CYSTOSCOPY [name] STENT PLACEMENT;  Surgeon: Irine Seal, MD;  Location: [facility];  Service: Urology;  Laterality: Right;  . CYSTOSCOPY W/ URETERAL STENT REMOVAL Right 07/16/2017   Procedure: CYSTOSCOPY WITH STENT REMOVAL;  Surgeon: Franchot Gallo, MD;  Location: [facility];  Service: Urology;  Laterality: Right;  . CYSTOSCOPY/URETEROSCOPY/HOLMIUM LASER/STENT PLACEMENT Right 07/16/2017   Procedure: CYSTOSCOPY/URETEROSCOPY/HOLMIUM LASER/STENT PLACEMENT/ALSO STONE EXTRACTION;  Surgeon: Franchot Gallo, MD;  Location: [facility];  Service: Urology;  Laterality: Right;  . GASTROSTOMY  11/23/2015  . I&D EXTREMITY  10/28/2011   Procedure: IRRIGATION AND DEBRIDEMENT EXTREMITY;  Surgeon: Tennis Must, MD;  Location: [facility];  Service: Orthopedics;  Laterality: Right;  Irrigation and debridement right middle finger  . INSERTION OF SUPRAPUBIC CATHETER N/A 07/28/2016   Procedure: INSERTION OF SUPRAPUBIC CATHETER;  Surgeon: Franchot Gallo,  MD;  Location: [facility];  Service: Urology;  Laterality: N/A;  . IR GENERIC HISTORICAL  11/23/2015   IR GASTROSTOMY TUBE MOD SED 11/23/2015 [facility]-INTER[facility] RAD  . IR PATIENT EVAL TECH 0-60 MINS  12/18/2017  . IR REPLACE G-TUBE SIMPLE WO FLUORO  12/19/2017  . LITHOTRIPSY  08/2017  . TRACHEOSTOMY       OB History   No obstetric history on file.      Home Medications    Prior to Admission medications   Medication Sig Start Date End Date Taking? Authorizing Provider  acetaminophen (TYLENOL) 500 MG tablet Take 1,000 mg by mouth every 6 (six) hours as needed for mild pain, moderate pain or headache (Fever >100.4).   Yes [provider]  albuterol (PROVENTIL HFA;VENTOLIN HFA) 108 (90 Base) MCG/ACT inhaler Inhale 1-2 puffs into the lungs every 6 (six) hours as needed for wheezing or shortness of breath.   Yes [provider]  baclofen (LIORESAL) 10 MG tablet Place 1 tablet (10 mg total) into feeding tube 3 (three) times daily. 12/31/17  Yes Arrien, Jimmy Picket, MD  baclofen (LIORESAL) 20 MG tablet Take 20 mg by mouth 3 (three) times daily. 01/18/18  Yes [provider]  DULoxetine (CYMBALTA) 60 MG capsule Take 60 mg by mouth 2 (two) times daily. 12/31/17  Yes [provider]  famotidine (PEPCID) 40 MG/5ML suspension Place 2.5 mLs (20 mg total) into feeding tube at bedtime. Patient taking differently: Place 40 mg into feeding tube at bedtime.  12/31/17  Yes Arrien, Jimmy Picket, MD  fentaNYL (DURAGESIC - DOSED MCG/HR) 25 MCG/HR patch Place 25 mcg onto the skin every 3 (three) days.   Yes [provider]  furosemide (LASIX) 40 MG tablet Place 1 tablet (40 mg total) into feeding tube daily. 01/01/18 02/14/18 Yes Arrien, Jimmy Picket, MD  gabapentin (NEURONTIN) 300 MG capsule Take 300 mg by mouth 3 (three) times daily. Via G-tube   Yes [provider]  guaiFENesin (ROBITUSSIN) 100 MG/5ML liquid Take 300 mg by mouth every 6 (six) hours.    Yes  [provider]  Heparin Sodium, Porcine, PF 5000 UNIT/ML SOLN Inject 5,000 Units as directed every 8 (eight) hours.    Yes [provider]  insulin aspart (NOVOLOG) 100 UNIT/ML injection Inject 10 Units into the skin every 4 (four) hours. For glucose 150 to 200 use 1 units, for glucose 201-250 use 2 units, for 251 to 300 use 4 units, for 301 to 350 use 6 units, for 351 to 400 use 8 units, for 401 or greater use 10 units. Patient taking differently: Inject 0-10 Units into the skin See admin instructions. Check FSBS every 6 hours. Give insulin according to sliding scale: If BS is 0-150 = 0u, 151-200 = 2u, 201-250 = 4u, 251-300 = 6u, 301-350 = 8u, 351-400 = 10u. >400 call MD 12/31/17  Yes Arrien, Jimmy Picket, MD  insulin glargine (LANTUS) 100 UNIT/ML injection Inject 33 Units into the skin 2 (two) times daily.   Yes [provider]  LORazepam (ATIVAN) 0.5 MG tablet Take 0.5 mg by mouth every 8 (eight) hours as needed for anxiety. Via G-tube   Yes [provider]  Melatonin 3 MG TABS Take 3 mg by mouth at bedtime.    Yes [provider]  metoCLOPramide (REGLAN) 5 MG tablet Take 5 mg by mouth every 6 (six) hours as needed (GERD). Via G-tube   Yes [provider]  midodrine (PROAMATINE) 10 MG tablet Take 10 mg by mouth 3 (three) times daily.   Yes [provider]  nicotine (NICODERM CQ - DOSED IN MG/24 HOURS) 14 mg/24hr patch Place 14 mg onto the skin daily.   Yes [provider]  Nutritional Supplements (PROMOD PO) Take 30 mLs by mouth daily.   Yes [provider]  Oxycodone HCl 10 MG TABS Place 5 mg into feeding tube every 4 (four) hours as needed (breakthrough pain).    Yes [provider]  OXYGEN Inhale 3 L into the lungs continuous.   Yes [provider]  polyethylene glycol (MIRALAX / GLYCOLAX) packet Take 17 g by mouth daily. Hold if diarrhea. 01/01/18  Yes Arrien, Jimmy Picket, MD  tiotropium  (SPIRIVA) 18 MCG inhalation capsule Place 18 mcg into inhaler and inhale daily.   Yes [provider]  tiZANidine (ZANAFLEX) 2 MG tablet Take 2 mg by mouth 3 (three) times daily.   Yes [provider]  zinc oxide (BALMEX) 11.3 % CREA cream Apply 1 application topically 3 (three) times daily as needed (skin redness/irritation).   Yes [provider]  zolpidem (AMBIEN) 10 MG tablet Take 10 mg by mouth at bedtime as needed for sleep.   Yes [provider]  ALPRAZolam (XANAX) 0.25 MG tablet Place 1 tablet (0.25 mg total) into feeding tube 3 (three) times daily  as needed for anxiety. Patient not taking: Reported on 01/29/2018 12/31/17   Arrien, Jimmy Picket, MD  arformoterol [facility]) 15 MCG/2ML NEBU Take 2 mLs (15 mcg total) by nebulization 2 (two) times daily. Patient not taking: Reported on 02/14/2018 12/31/17 02/14/18  Arrien, Jimmy Picket, MD  budesonide (PULMICORT) 0.5 MG/2ML nebulizer solution Take 2 mLs (0.5 mg total) by nebulization 2 (two) times daily. Patient not taking: Reported on 02/14/2018 12/31/17 02/14/18  Arrien, Jimmy Picket, MD  gabapentin (NEURONTIN) 600 MG tablet TAKE 1 TABLET (600 MG TOTAL) BY MOUTH 4 (FOUR) TIMES DAILY. Patient not taking: Reported on 02/14/2018 02/12/18   Kathrynn Ducking, MD  insulin detemir (LEVEMIR) 100 UNIT/ML injection Inject 0.2 mLs (20 Units total) into the skin daily. Patient not taking: Reported on 02/14/2018 01/01/18 02/14/18  Arrien, Jimmy Picket, MD  ipratropium (ATROVENT) 0.02 % nebulizer solution Take 2.5 mLs (0.5 mg total) by nebulization every 6 (six) hours. Patient not taking: Reported on 02/14/2018 12/31/17 02/14/18  Arrien, Jimmy Picket, MD  lactulose (CHRONULAC) 10 GM/15ML solution Place 15 mLs (10 g total) into feeding tube 2 (two) times daily. Patient not taking: Reported on 01/29/2018 12/31/17   Arrien, Jimmy Picket, MD  levalbuterol Penne Lash) 1.25 MG/0.5ML nebulizer solution Take 1.25 mg by  nebulization every 6 (six) hours. Patient not taking: Reported on 01/29/2018 12/31/17 01/30/18  Arrien, Jimmy Picket, MD  oxybutynin (DITROPAN) 5 MG tablet Place 1 tablet (5 mg total) into feeding tube every 8 (eight) hours as needed for bladder spasms. Patient not taking: Reported on 01/29/2018 12/31/17   Arrien, Jimmy Picket, MD  oxyCODONE (ROXICODONE) 5 MG/5ML solution Place 5 mLs (5 mg total) into feeding tube 3 (three) times daily. Patient not taking: Reported on 02/14/2018 12/31/17   Arrien, Jimmy Picket, MD    Family History Family History  Problem Relation Age of Onset  . Hypertension Maternal Uncle   . Kidney failure Maternal Uncle   . Colon cancer Mother   . Lung cancer Father   . Hypertension Brother   . Kidney failure Maternal Aunt   . Kidney failure Maternal Uncle   . Colon cancer Maternal Grandfather   . Esophageal cancer Neg Hx     Social History Social History   Tobacco Use  . Smoking status: Current Every Day Smoker    Packs/day: 1.00    Years: 25.00    Pack years: 25.00    Types: Cigarettes  . Smokeless tobacco: Never Used  Substance Use Topics  . Alcohol use: Never    Frequency: Never  . Drug use: No     Allergies   Erythromycin; Nitrofurantoin monohyd macro; Oxybutynin; and Penicillins   Review of Systems Review of Systems  Unable to perform ROS: Mental status change     Physical Exam Updated Vital Signs BP 97/81   Pulse 80   Temp 98 F (36.7 C) (Rectal)   Resp 13   Wt 59 kg   LMP 05/27/2016 (Within Days) Comment: Irregular periods since October 2015.  SpO2 100%   BMI 28.13 kg/m   Physical Exam Vitals signs and nursing note reviewed.  Constitutional:      General: She is not in acute distress.    Appearance: She is well-developed. She is not ill-appearing.  HENT:     Head: Normocephalic and atraumatic.     Nose: Nose normal.  Eyes:     Extraocular Movements: Extraocular movements intact.     Conjunctiva/sclera:  Conjunctivae normal.     Pupils: Pupils are  equal, round, and reactive to light.  Neck:     Musculoskeletal: Neck supple.  Cardiovascular:     Rate and Rhythm: Normal rate and regular rhythm.     Pulses: Normal pulses.     Heart sounds: Normal heart sounds. No murmur.  Pulmonary:     Effort: Pulmonary effort is normal. No respiratory distress.     Breath sounds: Normal breath sounds.  Abdominal:     Palpations: Abdomen is soft.     Tenderness: There is no abdominal tenderness.  Skin:    General: Skin is warm and dry.     Comments: Stage IV decubitus ulcer of the sacrum overall chronic appearing  Neurological:     Mental Status: She is alert. She is disoriented.     Comments: Cranial nerves grossly intact, patient can tell me her name and where she is, contractures in lower and upper extremities      ED Treatments / Results  Labs (all labs ordered are listed, but only abnormal results are displayed) Labs Reviewed  COMPREHENSIVE METABOLIC PANEL - Abnormal; Notable for the following components:      Result Value   Sodium 132 (*)    Potassium 3.3 (*)    Chloride 83 (*)    CO2 35 (*)    Glucose, Bld 224 (*)    Total Protein 8.4 (*)    Albumin 2.8 (*)    Alkaline Phosphatase 145 (*)    All other components within normal limits  CBC WITH DIFFERENTIAL/PLATELET - Abnormal; Notable for the following components:   WBC 19.5 (*)    RBC 3.76 (*)    Hemoglobin 9.8 (*)    HCT 32.3 (*)    RDW 16.4 (*)    Platelets 656 (*)    Neutro Abs 16.2 (*)    Monocytes Absolute 1.3 (*)    Eosinophils Absolute 0.6 (*)    Abs Immature Granulocytes 0.09 (*)    All other components within normal limits  URINALYSIS, ROUTINE W REFLEX MICROSCOPIC - Abnormal; Notable for the following components:   Color, Urine AMBER (*)    APPearance CLOUDY (*)    Glucose, UA 50 (*)    Hgb urine dipstick SMALL (*)    Protein, ur 100 (*)    Leukocytes, UA LARGE (*)    WBC, UA >50 (*)    All other components  within normal limits  LIPASE, BLOOD - Abnormal; Notable for the following components:   Lipase 120 (*)    All other components within normal limits  AMMONIA - Abnormal; Notable for the following components:   Ammonia 37 (*)    All other components within normal limits  CBG MONITORING, ED - Abnormal; Notable for the following components:   Glucose-Capillary 192 (*)    All other components within normal limits  POCT I-STAT 7, (LYTES, BLD GAS, ICA,H+H) - Abnormal; Notable for the following components:   pH, Arterial 7.525 (*)    pCO2 arterial 52.5 (*)    pO2, Arterial 120.0 (*)    Bicarbonate 43.4 (*)    TCO2 45 (*)    Acid-Base Excess 18.0 (*)    Sodium 129 (*)    Potassium 3.3 (*)    HCT 29.0 (*)    Hemoglobin 9.9 (*)    All other components within normal limits  CULTURE, BLOOD (ROUTINE X 2)  CULTURE, BLOOD (ROUTINE X 2)  URINE CULTURE  LACTIC ACID, PLASMA  LACTIC ACID, PLASMA  BLOOD GAS, ARTERIAL  EKG EKG Interpretation  Date/Time:  Sunday February 14 2018 07:06:26 EST Ventricular Rate:  80 PR Interval:    QRS Duration: 79 QT Interval:  401 QTC Calculation: 463 R Axis:   42 Text Interpretation:  Sinus rhythm Probable left atrial enlargement Probable left ventricular hypertrophy Confirmed by Lennice Sites [phone]) on 02/14/2018 7:51:48 AM   Radiology Ct Head Wo Contrast  Result Date: 02/14/2018 CLINICAL DATA:  Altered level of consciousness. EXAM: CT HEAD WITHOUT CONTRAST TECHNIQUE: Contiguous axial images were obtained from the base of the skull through the vertex without intravenous contrast. COMPARISON:  Head CT dated 12/21/2017. FINDINGS: Brain: Ventricles are stable in size and configuration. There is no mass, hemorrhage, edema or other evidence of acute parenchymal abnormality. No extra-axial hemorrhage. Vascular: No hyperdense vessel or unexpected calcification. Skull: Normal. Negative for fracture or focal lesion. Sinuses/Orbits: No acute finding. Other: None.  IMPRESSION: Negative head CT. No intracranial mass, hemorrhage or edema. Electronically Signed   By: Franki Cabot M.D.   On: 02/14/2018 08:54   Dg Chest Port 1 View  Result Date: 02/14/2018 CLINICAL DATA:  Altered mental status. EXAM: PORTABLE CHEST 1 VIEW COMPARISON:  01/31/2018 FINDINGS: There is a left chest wall pacer device with leads in the right atrial appendage and right ventricle. Tracheostomy tube tip is above the carina. Normal heart size. No pleural effusion or edema. Pulmonary vascular congestion is again noted. No airspace opacities. IMPRESSION: 1. No CHF or pneumonia. Electronically Signed   By: Kerby Moors M.D.   On: 02/14/2018 07:29    Procedures Procedures (including critical care time)  Medications Ordered in ED Medications  cefTAZidime (FORTAZ) 1 g in sodium chloride 0.9 % 100 mL IVPB (1 g Intravenous New Bag/Given 02/14/18 1005)  lactated ringers bolus 1,000 mL (0 mLs Intravenous Stopped 02/14/18 1017)  acetaminophen (TYLENOL) solution 650 mg (650 mg Per Tube Given 02/14/18 0807)     Initial Impression / Assessment and Plan / ED Course  I have reviewed the triage vital signs and the nursing notes.  Pertinent labs & imaging results that were available during my care of the patient were reviewed by me and considered in my medical decision making (see chart for details).     Angela Page is a 48 year old female with history of acute on chronic respiratory failure on 3 L of oxygen, chronic trach, suprapubic catheter, quadriplegia who presents the ED with altered mental status.  Patient with overall normal vitals.  No fever.  Patient with change in her mental status that started last night per family.  She is repeating phrases.  Has history of pneumonia and urinary tract infections.  Patient appears chronically ill-appearing.  Urine Foley is in place with cloudy looking urine.  Suspect likely urinary tract infection.  Coarse breath sounds throughout.  However appears  stable on 2 L of oxygen.  Patient with no abdominal tenderness on exam.  No known trauma.  Infectious work-up was initiated with blood cultures, urine study, chest x-ray.  Will get CT of the head.  ABG.  Patient with likely urinary tract infection.  Large leukocytes, will send for urine culture.  Patient started on IV ceftaz based on prior cultures.  Leukocytosis of 19.  Otherwise normal lactic acid.  Normal temperature, normal vitals.  Unlikely sepsis.  Patient with unremarkable electrolytes, kidney function, no anemia.  Blood gas shows chronic metabolic alkalosis.  Lipase is also mildly elevated.  Patient improving following IV fluids.  Given Tylenol. CT head with no acute  findings.  Admitted to hospitalist for further care.  Suspect mental status change from urinary tract infection.  This chart was dictated using voice recognition software.  Despite best efforts to proofread,  errors can occur which can change the documentation meaning.   Final Clinical Impressions(s) / ED Diagnoses   Final diagnoses:  Altered mental status, unspecified altered mental status type  Acute cystitis without hematuria  Elevated lipase    ED Discharge Orders    None       Lennice Sites, DO 02/14/18 1107

## 2018-02-14 NOTE — ED Notes (Signed)
Patient transported to CT 

## 2018-02-14 NOTE — H&P (Signed)
History and Physical    [name] ZOX:[id number] DOB: 02/23/1970 DOA: 02/14/2018  PCP: Default, Provider, MD  Patient coming from: home   I have personally briefly reviewed patient's old medical records available.   Chief Complaint: altered mental status   HPI: Angela Page is a 48 y.o. female with medical history significant of quadriplegia status post trach, PEG, history of polysubstance abuse, sacral ulcers and recent multiple hospitalizations, on multiple pain medications and sedating medications who presents to the hospital with altered mental status from home.  Apparently, patient lives at home with home health.  History is limited.  Patient just tells "I fell on the well".  She was recently admitted to the hospital multiple times with pneumonia, multidrug-resistant UTI with Pseudomonas, mucous plugging of the tracheostomy.  Family reported that patient is alert oriented x4 at baseline.  Her previous hospitalization he states patient able to communicate and occasionally difficult to understand however oriented.  Today patient is completely disoriented.  Family thinking that her disorientation could be related to changing dose of insulin or possible infection.  Apparently, her home health care nursing sent her to ER when they found her mumbling, repeating same words and confused. ED Course: Hemodynamically stable.  WBC count is 19.5.  Hemoglobin is a stable.  Her sodium is 129.  Glucose 224.  Lactate is normal.  Urinalysis shows grossly infected urine.  CT scan of the head shows grossly normal findings.  Chest x-ray shows normal findings.  Patient was given a dose of ceftazidime with history of Pseudomonas UTI. Review of Systems: Limited due to altered mental status.   Past Medical History:  Diagnosis Date  . Acute on chronic respiratory failure with hypoxia ([place])   . Anasarca 06/10/2016  . Asthma   . At high risk for severe sepsis   . Cocaine use 10/08/2013  . Decubitus  ulcer of buttock, unstageable ([place])   . Depression   . Diabetes mellitus without complication ([place])   . GERD (gastroesophageal reflux disease)   . HCAP (healthcare-associated pneumonia) 06/09/2016  . Hepatitis    hx of hepatits frm mono   . Kidney stone   . Lobar pneumonia ([place])   . Overdose of opiate or related narcotic ([place]) 09/02/2014  . Pacemaker   . Pleurisy   . Polysubstance dependence including opioid type drug, episodic abuse ([place]) 10/27/2013  . Protein calorie malnutrition ([place]) 10/31/2015  . Quadriparesis ([place])   . Quadriplegia and quadriparesis ([place])   . Quadriplegia, C5-C7 incomplete ([place]) 10/08/2013  . Stage IV pressure ulcer of sacral region ([place]) 10/31/2015  . Tracheostomy status [facility])     Past Surgical History:  Procedure Laterality Date  . APPENDECTOMY    . BACK SURGERY  10/08/2013   fusion of spine, cervical region  . CARDIAC SURGERY    . CYSTOSCOPY W/ URETERAL STENT PLACEMENT Right 03/09/2017   Procedure: CYSTOSCOPY [name] STENT PLACEMENT;  Surgeon: Irine Seal, MD;  Location: [facility];  Service: Urology;  Laterality: Right;  . CYSTOSCOPY W/ URETERAL STENT REMOVAL Right 07/16/2017   Procedure: CYSTOSCOPY WITH STENT REMOVAL;  Surgeon: Franchot Gallo, MD;  Location: [facility];  Service: Urology;  Laterality: Right;  . CYSTOSCOPY/URETEROSCOPY/HOLMIUM LASER/STENT PLACEMENT Right 07/16/2017   Procedure: CYSTOSCOPY/URETEROSCOPY/HOLMIUM LASER/STENT PLACEMENT/ALSO STONE EXTRACTION;  Surgeon: Franchot Gallo, MD;  Location: [facility];  Service: Urology;  Laterality: Right;  . GASTROSTOMY  11/23/2015  . I&D EXTREMITY  10/28/2011   Procedure: IRRIGATION AND DEBRIDEMENT EXTREMITY;  Surgeon: Tennis Must, MD;  Location: [place];  Service: Orthopedics;  Laterality: Right;  Irrigation and debridement right middle finger  . INSERTION OF SUPRAPUBIC CATHETER N/A 07/28/2016   Procedure: INSERTION OF SUPRAPUBIC CATHETER;  Surgeon: Franchot Gallo, MD;  Location: [facility];  Service: Urology;  Laterality: N/A;  . IR GENERIC HISTORICAL  11/23/2015   IR GASTROSTOMY TUBE MOD SED 11/23/2015 [facility]  . IR PATIENT EVAL TECH 0-60 MINS  12/18/2017  . IR REPLACE G-TUBE SIMPLE WO FLUORO  12/19/2017  . LITHOTRIPSY  08/2017  . TRACHEOSTOMY       reports that she has been smoking cigarettes. She has a 25.00 pack-year smoking history. She has never used smokeless tobacco. She reports that she does not drink alcohol or use drugs.  Allergies  Allergen Reactions  . Erythromycin Nausea And Vomiting  . Nitrofurantoin Monohyd Macro Nausea And Vomiting  . Oxybutynin     Dries mouth out   . Penicillins Hives    Has patient had a PCN reaction causing immediate rash, facial/tongue/throat swelling, SOB or lightheadedness with hypotension: Yes Has patient had a PCN reaction causing severe rash involving mucus membranes or skin necrosis: Yes Has patient had a PCN reaction that required hospitalization No Has patient had a PCN reaction occurring within the last 10 years: No-childhood allergy If all of the above answers are "NO", then may proceed with Cephalosporin     Family History  Problem Relation Age of Onset  . Hypertension Maternal Uncle   . Kidney failure Maternal Uncle   . Colon cancer Mother   . Lung cancer Father   . Hypertension Brother   . Kidney failure Maternal Aunt   . Kidney failure Maternal Uncle   . Colon cancer Maternal Grandfather   . Esophageal cancer Neg Hx      Prior to Admission medications   Medication Sig Start Date End Date Taking? Authorizing Provider  acetaminophen (TYLENOL) 500 MG tablet Take 1,000 mg by mouth every 6 (six) hours as needed for mild pain, moderate pain or headache (Fever >100.4).   Yes [provider]  baclofen (LIORESAL) 10 MG tablet Place 1 tablet (10 mg total) into feeding tube 3 (three) times daily. 12/31/17  Yes Arrien, Jimmy Picket, MD  baclofen (LIORESAL) 20 MG tablet Take 20 mg by mouth 3 (three)  times daily. 01/18/18  Yes [provider]  DULoxetine (CYMBALTA) 60 MG capsule Take 60 mg by mouth 2 (two) times daily. 12/31/17  Yes [provider]  famotidine (PEPCID) 40 MG/5ML suspension Place 2.5 mLs (20 mg total) into feeding tube at bedtime. Patient taking differently: Place 40 mg into feeding tube at bedtime.  12/31/17  Yes Arrien, Jimmy Picket, MD  fentaNYL (DURAGESIC - DOSED MCG/HR) 25 MCG/HR patch Place 25 mcg onto the skin every 3 (three) days.   Yes [provider]  furosemide (LASIX) 40 MG tablet Place 1 tablet (40 mg total) into feeding tube daily. 01/01/18 02/14/18 Yes Arrien, Jimmy Picket, MD  gabapentin (NEURONTIN) 300 MG capsule Take 300 mg by mouth 3 (three) times daily. Via G-tube   Yes [provider]  guaiFENesin (ROBITUSSIN) 100 MG/5ML liquid Take 300 mg by mouth every 6 (six) hours.    Yes [provider]  Heparin Sodium, Porcine, PF 5000 UNIT/ML SOLN Inject 5,000 Units as directed every 8 (eight) hours.    Yes [provider]  insulin aspart (NOVOLOG) 100 UNIT/ML injection Inject 10 Units into the skin every 4 (four) hours. For glucose  150 to 200 use 1 units, for glucose 201-250 use 2 units, for 251 to 300 use 4 units, for 301 to 350 use 6 units, for 351 to 400 use 8 units, for 401 or greater use 10 units. Patient taking differently: Inject 0-10 Units into the skin See admin instructions. Check FSBS every 6 hours. Give insulin according to sliding scale: If BS is 0-150 = 0u, 151-200 = 2u, 201-250 = 4u, 251-300 = 6u, 301-350 = 8u, 351-400 = 10u. >400 call MD 12/31/17  Yes Arrien, Jimmy Picket, MD  insulin glargine (LANTUS) 100 UNIT/ML injection Inject 33 Units into the skin 2 (two) times daily.   Yes [provider]  LORazepam (ATIVAN) 0.5 MG tablet Take 0.5 mg by mouth every 8 (eight) hours as needed for anxiety. Via G-tube   Yes [provider]  Melatonin 3 MG TABS Take 3 mg by mouth at bedtime.     Yes [provider]  metoCLOPramide (REGLAN) 5 MG tablet Take 5 mg by mouth every 6 (six) hours as needed (GERD). Via G-tube   Yes [provider]  midodrine (PROAMATINE) 10 MG tablet Take 10 mg by mouth 3 (three) times daily.   Yes [provider]  nicotine (NICODERM CQ - DOSED IN MG/24 HOURS) 14 mg/24hr patch Place 14 mg onto the skin daily.   Yes [provider]  Nutritional Supplements (PROMOD PO) Take 30 mLs by mouth daily.   Yes [provider]  OXYGEN Inhale 3 L into the lungs continuous.   Yes [provider]  polyethylene glycol (MIRALAX / GLYCOLAX) packet Take 17 g by mouth daily. Hold if diarrhea. 01/01/18  Yes Arrien, Jimmy Picket, MD  zinc oxide (BALMEX) 11.3 % CREA cream Apply 1 application topically 3 (three) times daily as needed (skin redness/irritation).   Yes [provider]  ALPRAZolam (XANAX) 0.25 MG tablet Place 1 tablet (0.25 mg total) into feeding tube 3 (three) times daily as needed for anxiety. Patient not taking: Reported on 01/29/2018 12/31/17   Arrien, Jimmy Picket, MD  arformoterol [facility]) 15 MCG/2ML NEBU Take 2 mLs (15 mcg total) by nebulization 2 (two) times daily. Patient not taking: Reported on 02/14/2018 12/31/17 02/14/18  Arrien, Jimmy Picket, MD  budesonide (PULMICORT) 0.5 MG/2ML nebulizer solution Take 2 mLs (0.5 mg total) by nebulization 2 (two) times daily. Patient not taking: Reported on 02/14/2018 12/31/17 02/14/18  Arrien, Jimmy Picket, MD  gabapentin (NEURONTIN) 600 MG tablet TAKE 1 TABLET (600 MG TOTAL) BY MOUTH 4 (FOUR) TIMES DAILY. Patient not taking: Reported on 02/14/2018 02/12/18   Kathrynn Ducking, MD  insulin detemir (LEVEMIR) 100 UNIT/ML injection Inject 0.2 mLs (20 Units total) into the skin daily. Patient not taking: Reported on 02/14/2018 01/01/18 02/14/18  Arrien, Jimmy Picket, MD  ipratropium (ATROVENT) 0.02 % nebulizer solution Take 2.5 mLs (0.5 mg total) by  nebulization every 6 (six) hours. Patient not taking: Reported on 02/14/2018 12/31/17 02/14/18  Arrien, Jimmy Picket, MD  lactulose (CHRONULAC) 10 GM/15ML solution Place 15 mLs (10 g total) into feeding tube 2 (two) times daily. Patient not taking: Reported on 01/29/2018 12/31/17   Arrien, Jimmy Picket, MD  levalbuterol Penne Lash) 1.25 MG/0.5ML nebulizer solution Take 1.25 mg by nebulization every 6 (six) hours. Patient not taking: Reported on 01/29/2018 12/31/17 01/30/18  Arrien, Jimmy Picket, MD  oxybutynin (DITROPAN) 5 MG tablet Place 1 tablet (5 mg total) into feeding tube every 8 (eight) hours as needed for bladder spasms. Patient not taking: Reported on  01/29/2018 12/31/17   Arrien, Jimmy Picket, MD  oxyCODONE (ROXICODONE) 5 MG/5ML solution Place 5 mLs (5 mg total) into feeding tube 3 (three) times daily. Patient not taking: Reported on 02/14/2018 12/31/17   Tawni Millers, MD    Physical Exam: Vitals:   02/14/18 0915 02/14/18 0930 02/14/18 0945 02/14/18 1000  BP: 120/77 113/70 95/69 97/81   Pulse: 81 80 82 80  Resp:      Temp:      TempSrc:      SpO2: 100% 99% 99% 100%  Weight:        Constitutional: NAD, calm, comfortable Vitals:   02/14/18 0915 02/14/18 0930 02/14/18 0945 02/14/18 1000  BP: 120/77 113/70 95/69 97/81   Pulse: 81 80 82 80  Resp:      Temp:      TempSrc:      SpO2: 100% 99% 99% 100%  Weight:       Eyes: PERRL, lids and conjunctivae normal ENMT: oral secretions thick. Posterior pharynx clear of any exudate or lesions.Normal dentition.  Neck: normal, supple, no masses, no thyromegaly Trach in place with PM valve present.  Respiratory: Mostly bilateral conducted airway sounds.  Normal respiratory effort. No accessory muscle use.  Cardiovascular: Regular rate and rhythm, no murmurs / rubs / gallops. No extremity edema. 2+ pedal pulses. No carotid bruits.  Pacemaker in place. Abdomen: no tenderness, no masses palpated. No hepatosplenomegaly. Bowel  sounds positive.  Obese. PEG tube site clean and dry. Suprapubic catheter clean and dry. Musculoskeletal: Contracted extremities. Stage IV decubitus ulcer. Skin: no rashes, lesions, No induration Neurologic: CN 2-12 grossly intact.  Strength 3/5 in upper extremities.  Lower extremities she does not move.  Sensation absent. Psychiatric:  Disoriented.  Unintelligible answer.  Just states she fell on well.    Labs on Admission: I have personally reviewed following labs and imaging studies  CBC: Recent Labs  Lab 02/14/18 0719 02/14/18 0759  WBC 19.5*  --   NEUTROABS 16.2*  --   HGB 9.8* 9.9*  HCT 32.3* 29.0*  MCV 85.9  --   PLT 656*  --    Basic Metabolic Panel: Recent Labs  Lab 02/14/18 0719 02/14/18 0759  NA 132* 129*  K 3.3* 3.3*  CL 83*  --   CO2 35*  --   GLUCOSE 224*  --   BUN 18  --   CREATININE 0.52  --   CALCIUM 10.1  --    GFR: Estimated Creatinine Clearance: 64.2 mL/min (by C-G formula based on SCr of 0.52 mg/dL). Liver Function Tests: Recent Labs  Lab 02/14/18 0719  AST 18  ALT 17  ALKPHOS 145*  BILITOT 0.6  PROT 8.4*  ALBUMIN 2.8*   Recent Labs  Lab 02/14/18 0719  LIPASE 120*   Recent Labs  Lab 02/14/18 0848  AMMONIA 37*   Coagulation Profile: No results for input(s): INR, PROTIME in the last 168 hours. Cardiac Enzymes: No results for input(s): CKTOTAL, CKMB, CKMBINDEX, TROPONINI in the last 168 hours. BNP (last 3 results) Recent Labs    04/28/17 1110  PROBNP 62.0   HbA1C: No results for input(s): HGBA1C in the last 72 hours. CBG: Recent Labs  Lab 02/14/18 0822  GLUCAP 192*   Lipid Profile: No results for input(s): CHOL, HDL, LDLCALC, TRIG, CHOLHDL, LDLDIRECT in the last 72 hours. Thyroid Function Tests: No results for input(s): TSH, T4TOTAL, FREET4, T3FREE, THYROIDAB in the last 72 hours. Anemia Panel: No results for input(s): VITAMINB12, FOLATE, FERRITIN, TIBC, IRON,  RETICCTPCT in the last 72 hours. Urine analysis:      Component Value Date/Time   COLORURINE AMBER (A) 02/14/2018 0822   APPEARANCEUR CLOUDY (A) 02/14/2018 0822   LABSPEC 1.023 02/14/2018 0822   PHURINE 7.0 02/14/2018 0822   GLUCOSEU 50 (A) 02/14/2018 0822   GLUCOSEU NEGATIVE 04/28/2017 1121   HGBUR SMALL (A) 02/14/2018 0822   BILIRUBINUR NEGATIVE 02/14/2018 0822   KETONESUR NEGATIVE 02/14/2018 0822   PROTEINUR 100 (A) 02/14/2018 0822   UROBILINOGEN 0.2 04/28/2017 1121   NITRITE NEGATIVE 02/14/2018 0822   LEUKOCYTESUR LARGE (A) 02/14/2018 0822    Radiological Exams on Admission: Ct Head Wo Contrast  Result Date: 02/14/2018 CLINICAL DATA:  Altered level of consciousness. EXAM: CT HEAD WITHOUT CONTRAST TECHNIQUE: Contiguous axial images were obtained from the base of the skull through the vertex without intravenous contrast. COMPARISON:  Head CT dated 12/21/2017. FINDINGS: Brain: Ventricles are stable in size and configuration. There is no mass, hemorrhage, edema or other evidence of acute parenchymal abnormality. No extra-axial hemorrhage. Vascular: No hyperdense vessel or unexpected calcification. Skull: Normal. Negative for fracture or focal lesion. Sinuses/Orbits: No acute finding. Other: None. IMPRESSION: Negative head CT. No intracranial mass, hemorrhage or edema. Electronically Signed   By: Franki Cabot M.D.   On: 02/14/2018 08:54   Dg Chest Port 1 View  Result Date: 02/14/2018 CLINICAL DATA:  Altered mental status. EXAM: PORTABLE CHEST 1 VIEW COMPARISON:  01/31/2018 FINDINGS: There is a left chest wall pacer device with leads in the right atrial appendage and right ventricle. Tracheostomy tube tip is above the carina. Normal heart size. No pleural effusion or edema. Pulmonary vascular congestion is again noted. No airspace opacities. IMPRESSION: 1. No CHF or pneumonia. Electronically Signed   By: Kerby Moors M.D.   On: 02/14/2018 07:29    EKG: Independently reviewed.  Normal sinus rhythm.  Normal change from earlier  EKGs.  Assessment/Plan Principal Problem:   Acute metabolic encephalopathy Active Problems:   Protein-calorie malnutrition, severe (HCC)   Quadriplegia, C5-C7 incomplete (HCC)   Leukocytosis   Decubitus ulcer of sacral region, stage 4 (HCC)   Tracheostomy status (HCC)   Presence of permanent cardiac pacemaker   Urinary bladder neurogenic dysfunction   Indwelling Foley catheter present   PEG (percutaneous endoscopic gastrostomy) status ([place])   Sepsis secondary to UTI ([place])   Paraplegia following spinal cord injury ([place])   Chronic pain due to injury   Encephalopathy acute   Acute metabolic encephalopathy: Probable multiple causes. Suspect polypharmacy: Patient is on oxycodone, fentanyl patch, baclofen, Zanaflex, Xanax, Ativan, Cymbalta, gabapentin.  Patient currently completely disoriented and confused.  Fentanyl patch taken off.  Discontinue all sedating medications until patient is more alert.  We will introduce small doses of SSRI through PEG tube once patient wakes up and also start on a small dose of opiates when she is more awake, she is using it for long time. Acute UTI present on admission due to presence of indwelling suprapubic catheter: Suspected.  Previous history of Pseudomonas infection.  Allergy to penicillin.  Received Tressie Ellis in the ER.  Will continue until final cultures.  Blood cultures and urine cultures are drawn.  Tracheostomy status: Her respiratory status is currently stable.  Patient has tracheostomy with thick sputum and also with oral secretions.  Keep n.p.o.  Respite therapy to do aggressive chest physiotherapy, suction and bronchodilator.  Patient does not have any evidence of pneumonia, will monitor closely.  Quadriplegia with dysphagia, bedbound status, PEG tube: Continue bed  care, consult nutrition to resume tube feeding, case management to follow-up on safe discharge.  Decubitus ulcer on the sacrum/decubitus ulcer buttock: Stage IV and stage III sacral and  buttock decubitus ulcer present on admission.  Will consult wound care for long-term wound care management.  Chronic pain: Difficult to treat.  Currently totally altered.  Holding all opiates and sedations.  Will need careful reinitiation of therapy.  Type 2 diabetes: Unknown control.  Resume lower doses of Levemir and keep on sliding scale insulin.  Will check A1c to evaluate the response.  DVT prophylaxis: Heparin subcu. Code Status: Full code. Family Communication: No family at bedside. Disposition Plan: Home with 24 hours supervision versus nursing care. Consults called: None. Admission status: Inpatient.   Barb Merino MD [facility] Pager [phone]  If 7PM-7AM, please contact night-coverage [email or web address] Password [email or web address]  02/14/2018, 10:42 AM

## 2018-02-14 NOTE — ED Notes (Signed)
Report called  

## 2018-02-14 NOTE — Progress Notes (Signed)
Pharmacy Antibiotic Note  Angela Page is a 48 y.o. female admitted on 02/14/2018 with AMS.  Pharmacy has been consulted for Fortaz dosing for possible UTI.  Patient has Pseudomonas in UCx in the past that was sensitive to [place].  Noted she is quadriplegic, which would result in an overestimation of renal clearance.  Afebrile, WBC 19.5.  Plan: Angela Page 1gm IV Q8H Monitor renal fxn, micro data to de-escalate abx   Weight: 130 lb (59 kg)  Temp (24hrs), Avg:98 F (36.7 C), Min:98 F (36.7 C), Max:98 F (36.7 C)  Recent Labs  Lab 02/14/18 0719  WBC 19.5*  CREATININE 0.52  LATICACIDVEN 1.0    Estimated Creatinine Clearance: 64.2 mL/min (by C-G formula based on SCr of 0.52 mg/dL).    Allergies  Allergen Reactions  . Erythromycin Nausea And Vomiting  . Nitrofurantoin Monohyd Macro Nausea And Vomiting  . Oxybutynin     Dries mouth out   . Penicillins Hives    Has patient had a PCN reaction causing immediate rash, facial/tongue/throat swelling, SOB or lightheadedness with hypotension: Yes Has patient had a PCN reaction causing severe rash involving mucus membranes or skin necrosis: Yes Has patient had a PCN reaction that required hospitalization No Has patient had a PCN reaction occurring within the last 10 years: No-childhood allergy If all of the above answers are "NO", then may proceed with Cephalosporin     Angela Page 1/26 >>  1/26 UCx -  1/26 BCx -  Angela Page, PharmD, BCPS, [place] 02/14/2018, 9:40 AM

## 2018-02-14 NOTE — Progress Notes (Signed)
Brief Nutrition Note  Consult received for enteral/tube feeding initiation and management.  Adult Enteral Nutrition Protocol initiated. Full assessment to follow.  Admitting Dx: Acute cystitis without hematuria [N30.00] Elevated lipase [R74.8] Altered mental status, unspecified altered mental status type [R41.82]  Body mass index is 28.13 kg/m. Pt meets criteria for overweight based on current BMI.  Labs:  Recent Labs  Lab 02/14/18 0719 02/14/18 0759  NA 132* 129*  K 3.3* 3.3*  CL 83*  --   CO2 35*  --   BUN 18  --   CREATININE 0.52  --   CALCIUM 10.1  --   GLUCOSE 224*  --     Clayton Bibles, MS, RD, LDN [facility] Inpatient Clinical Dietitian Pager: [phone] After Hours Pager: [phone]

## 2018-02-14 NOTE — ED Triage Notes (Signed)
Pt comes from home via [facility], been altered for the past two hours, trach dependent, quadriplegic. Pt alert to self, normally AxO x 4. Recently here for sepsis and diabetes

## 2018-02-14 NOTE — Evaluation (Signed)
Clinical/Bedside Swallow Evaluation Patient Details  Name: Angela Page MRN: [id number] Date of Birth: 1970-03-17  Today's Date: 02/14/2018 Time: SLP Start Time (ACUTE ONLY): 1448 SLP Stop Time (ACUTE ONLY): 1502 SLP Time Calculation (min) (ACUTE ONLY): 14 min  Past Medical History:  Past Medical History:  Diagnosis Date  . Acute on chronic respiratory failure with hypoxia ([place])   . Anasarca 06/10/2016  . Asthma   . At high risk for severe sepsis   . Cocaine use 10/08/2013  . Decubitus ulcer of buttock, unstageable ([place])   . Depression   . Diabetes mellitus without complication ([place])   . GERD (gastroesophageal reflux disease)   . HCAP (healthcare-associated pneumonia) 06/09/2016  . Hepatitis    hx of hepatits frm mono   . Kidney stone   . Lobar pneumonia ([place])   . Overdose of opiate or related narcotic ([place]) 09/02/2014  . Pacemaker   . Pleurisy   . Polysubstance dependence including opioid type drug, episodic abuse ([place]) 10/27/2013  . Protein calorie malnutrition ([place]) 10/31/2015  . Quadriparesis ([place])   . Quadriplegia and quadriparesis ([place])   . Quadriplegia, C5-C7 incomplete ([place]) 10/08/2013  . Stage IV pressure ulcer of sacral region ([place]) 10/31/2015  . Tracheostomy status [facility])    Past Surgical History:  Past Surgical History:  Procedure Laterality Date  . APPENDECTOMY    . BACK SURGERY  10/08/2013   fusion of spine, cervical region  . CARDIAC SURGERY    . CYSTOSCOPY W/ URETERAL STENT PLACEMENT Right 03/09/2017   Procedure: CYSTOSCOPY [name] STENT PLACEMENT;  Surgeon: Irine Seal, MD;  Location: [facility];  Service: Urology;  Laterality: Right;  . CYSTOSCOPY W/ URETERAL STENT REMOVAL Right 07/16/2017   Procedure: CYSTOSCOPY WITH STENT REMOVAL;  Surgeon: Franchot Gallo, MD;  Location: [facility];  Service: Urology;  Laterality: Right;  . CYSTOSCOPY/URETEROSCOPY/HOLMIUM LASER/STENT PLACEMENT Right 07/16/2017   Procedure: CYSTOSCOPY/URETEROSCOPY/HOLMIUM LASER/STENT  PLACEMENT/ALSO STONE EXTRACTION;  Surgeon: Franchot Gallo, MD;  Location: [facility];  Service: Urology;  Laterality: Right;  . GASTROSTOMY  11/23/2015  . I&D EXTREMITY  10/28/2011   Procedure: IRRIGATION AND DEBRIDEMENT EXTREMITY;  Surgeon: Tennis Must, MD;  Location: [place];  Service: Orthopedics;  Laterality: Right;  Irrigation and debridement right middle finger  . INSERTION OF SUPRAPUBIC CATHETER N/A 07/28/2016   Procedure: INSERTION OF SUPRAPUBIC CATHETER;  Surgeon: Franchot Gallo, MD;  Location: [facility];  Service: Urology;  Laterality: N/A;  . IR GENERIC HISTORICAL  11/23/2015   IR GASTROSTOMY TUBE MOD SED 11/23/2015 [facility]  . IR PATIENT EVAL TECH 0-60 MINS  12/18/2017  . IR REPLACE G-TUBE SIMPLE WO FLUORO  12/19/2017  . LITHOTRIPSY  08/2017  . TRACHEOSTOMY     HPI:  Pt is a 48 y.o. female admitted with AMS. Polypharmacy suspected per MD H&P note, although UTI is also in the differential. CT Head and CXR negative. She was seen most recently by SLP 02/03/18 with recommendation for free water protocol only. She has a h/o chronic dysphagia s/p PEG but has been on oral diets. Most recent FEES in May 2018 recommended Dys 3 diet, thin liquids with use of chin tuck. PMH includes: chronic trach, quadriplegia, PNA, GERD, DM, substance abuse   Assessment / Plan / Recommendation Clinical Impression  Pt with chronic dysphagia, intermittently on oral diets, presents with AMS, making it difficult to obtain a reliable hx about swallow function PTA. Most recent recommendation earlier this month was for water protocol only. Immediate coughing and expectoration  of secretions is observed with trials of small, single ice chips. This appears to be an acute decline compared to her last few clinical evaluations and may be related to AMS. For now, recommend that she remain NPO, utilizing her PEG. SLP will f/u for readiness to restart POs. SLP Visit Diagnosis: Dysphagia, unspecified  (R13.10)    Aspiration Risk  Moderate aspiration risk;Severe aspiration risk    Diet Recommendation NPO;Other (Comment)(pt already has PEG)        Other  Recommendations Oral Care Recommendations: Oral care QID Other Recommendations: Have oral suction available   Follow up Recommendations (tba)      Frequency and Duration min 2x/week  2 weeks       Prognosis Prognosis for Safe Diet Advancement: Fair Barriers to Reach Goals: Time post onset      Swallow Study   General HPI: Pt is a 48 y.o. female admitted with AMS. Polypharmacy suspected per MD H&P note, although UTI is also in the differential. CT Head and CXR negative. She was seen most recently by SLP 02/03/18 with recommendation for free water protocol only. She has a h/o chronic dysphagia s/p PEG but has been on oral diets. Most recent FEES in May 2018 recommended Dys 3 diet, thin liquids with use of chin tuck. PMH includes: chronic trach, quadriplegia, PNA, GERD, DM, substance abuse Type of Study: Bedside Swallow Evaluation Previous Swallow Assessment: see HPI Diet Prior to this Study: NPO;PEG tube Temperature Spikes Noted: No Respiratory Status: Trach;Nasal cannula Trach Size and Type: #6;With PMSV in place;Uncuffed History of Recent Intubation: No Behavior/Cognition: Alert;Requires cueing Oral Cavity Assessment: Within Functional Limits Oral Care Completed by SLP: No Oral Cavity - Dentition: Missing dentition Self-Feeding Abilities: Total assist Patient Positioning: Upright in bed Baseline Vocal Quality: Normal Volitional Cough: Weak Volitional Swallow: Able to elicit    Oral/Motor/Sensory Function Overall Oral Motor/Sensory Function: Within functional limits   Ice Chips Ice chips: Impaired Presentation: Spoon Pharyngeal Phase Impairments: Cough - Immediate   Thin Liquid Thin Liquid: Not tested    Nectar Thick Nectar Thick Liquid: Not tested   Honey Thick Honey Thick Liquid: Not tested   Puree Puree: Not  tested   Solid     Solid: Not tested      [name] 02/14/2018,3:19 PM   Nuala Alpha, M.A. [place] Acute Environmental education officer [phone] Office [phone]

## 2018-02-15 DIAGNOSIS — Z930 Tracheostomy status: Secondary | ICD-10-CM

## 2018-02-15 DIAGNOSIS — G8921 Chronic pain due to trauma: Secondary | ICD-10-CM

## 2018-02-15 DIAGNOSIS — L89154 Pressure ulcer of sacral region, stage 4: Secondary | ICD-10-CM

## 2018-02-15 LAB — GLUCOSE, CAPILLARY
Glucose-Capillary: 122 mg/dL — ABNORMAL HIGH (ref 70–99)
Glucose-Capillary: 148 mg/dL — ABNORMAL HIGH (ref 70–99)
Glucose-Capillary: 155 mg/dL — ABNORMAL HIGH (ref 70–99)
Glucose-Capillary: 139 mg/dL — ABNORMAL HIGH (ref 70–99)
Glucose-Capillary: 118 mg/dL — ABNORMAL HIGH (ref 70–99)

## 2018-02-15 LAB — CBC
HEMATOCRIT: 26 % — AB (ref 36.0–46.0)
Hemoglobin: 8 g/dL — ABNORMAL LOW (ref 12.0–15.0)
MCH: 26.2 pg (ref 26.0–34.0)
MCHC: 30.8 g/dL (ref 30.0–36.0)
MCV: 85.2 fL (ref 80.0–100.0)
Platelets: UNDETERMINED 10*3/uL (ref 150–400)
RBC: 3.05 MIL/uL — ABNORMAL LOW (ref 3.87–5.11)
RDW: 16.3 % — ABNORMAL HIGH (ref 11.5–15.5)
WBC: 13 10*3/uL — ABNORMAL HIGH (ref 4.0–10.5)
nRBC: 0 % (ref 0.0–0.2)

## 2018-02-15 LAB — COMPREHENSIVE METABOLIC PANEL
ALBUMIN: 2.4 g/dL — AB (ref 3.5–5.0)
ALT: 22 U/L (ref 0–44)
AST: 26 U/L (ref 15–41)
Alkaline Phosphatase: 153 U/L — ABNORMAL HIGH (ref 38–126)
Anion gap: 14 (ref 5–15)
BUN: 10 mg/dL (ref 6–20)
CO2: 31 mmol/L (ref 22–32)
Calcium: 9.3 mg/dL (ref 8.9–10.3)
Chloride: 91 mmol/L — ABNORMAL LOW (ref 98–111)
Creatinine, Ser: 0.56 mg/dL (ref 0.44–1.00)
GFR calc Af Amer: >60 mL/min (ref >60)
GFR calc non Af Amer: >60 mL/min (ref >60)
GLUCOSE: 156 mg/dL — AB (ref 70–99)
Potassium: 3.2 mmol/L — ABNORMAL LOW (ref 3.5–5.1)
Sodium: 136 mmol/L (ref 135–145)
Total Bilirubin: 0.7 mg/dL (ref 0.3–1.2)
Total Protein: 7.2 g/dL (ref 6.5–8.1)

## 2018-02-15 LAB — MAGNESIUM: Magnesium: 2.3 mg/dL (ref 1.7–2.4)

## 2018-02-15 MED ORDER — COLLAGENASE 250 UNIT/GM EX OINT
TOPICAL_OINTMENT | Freq: Every day | CUTANEOUS | Status: DC
  Administered 2018-02-15 – 2018-02-18 (×4): via TOPICAL
  Filled 2018-02-15: qty 30

## 2018-02-15 MED ORDER — GABAPENTIN 100 MG PO CAPS
100.0000 mg | ORAL_CAPSULE | Freq: Three times a day (TID) | ORAL | Status: DC
  Administered 2018-02-15 – 2018-02-18 (×11): 100 mg via ORAL
  Filled 2018-02-15 (×11): qty 1

## 2018-02-15 MED ORDER — TRAZODONE HCL 50 MG PO TABS
25.0000 mg | ORAL_TABLET | Freq: Once | ORAL | Status: AC
  Administered 2018-02-16: 25 mg via ORAL
  Filled 2018-02-15: qty 1

## 2018-02-15 MED ORDER — OXYCODONE HCL 5 MG PO TABS
5.0000 mg | ORAL_TABLET | ORAL | Status: DC | PRN
  Administered 2018-02-15 – 2018-02-18 (×18): 5 mg
  Filled 2018-02-15 (×18): qty 1

## 2018-02-15 MED ORDER — POTASSIUM CHLORIDE 20 MEQ PO PACK
40.0000 meq | PACK | Freq: Once | ORAL | Status: AC
  Administered 2018-02-15: 40 meq
  Filled 2018-02-15: qty 2

## 2018-02-15 MED ORDER — DULOXETINE HCL 30 MG PO CPEP
30.0000 mg | ORAL_CAPSULE | Freq: Two times a day (BID) | ORAL | Status: DC
  Administered 2018-02-15 – 2018-02-18 (×7): 30 mg via ORAL
  Filled 2018-02-15 (×7): qty 1

## 2018-02-15 MED ORDER — GABAPENTIN 300 MG PO CAPS
300.0000 mg | ORAL_CAPSULE | Freq: Once | ORAL | Status: AC
  Administered 2018-02-15: 300 mg via ORAL
  Filled 2018-02-15: qty 1

## 2018-02-15 MED ORDER — UMECLIDINIUM BROMIDE 62.5 MCG/INH IN AEPB
1.0000 | INHALATION_SPRAY | Freq: Every day | RESPIRATORY_TRACT | Status: DC
  Administered 2018-02-16 – 2018-02-18 (×3): 1 via RESPIRATORY_TRACT
  Filled 2018-02-15: qty 7

## 2018-02-15 MED ORDER — OSMOLITE 1.5 CAL PO LIQD
1000.0000 mL | ORAL | Status: DC
  Filled 2018-02-15 (×3): qty 1000

## 2018-02-15 MED ORDER — TIOTROPIUM BROMIDE MONOHYDRATE 18 MCG IN CAPS: 18.0000 ug | ORAL_CAPSULE | Freq: Every day | RESPIRATORY_TRACT | Status: DC

## 2018-02-15 MED ORDER — PRO-STAT SUGAR FREE PO LIQD
30.0000 mL | Freq: Two times a day (BID) | ORAL | Status: DC
  Administered 2018-02-16: 30 mL
  Filled 2018-02-15 (×4): qty 30

## 2018-02-15 MED ORDER — POLYETHYLENE GLYCOL 3350 17 G PO PACK
17.0000 g | PACK | Freq: Every day | ORAL | Status: DC
  Administered 2018-02-15 – 2018-02-18 (×4): 17 g via ORAL
  Filled 2018-02-15 (×4): qty 1

## 2018-02-15 MED ORDER — BACLOFEN 10 MG PO TABS
20.0000 mg | ORAL_TABLET | Freq: Once | ORAL | Status: AC
  Administered 2018-02-15: 20 mg via ORAL
  Filled 2018-02-15: qty 2

## 2018-02-15 NOTE — Progress Notes (Addendum)
PROGRESS NOTE        PATIENT DETAILS Name: Angela Page Age: 48 y.o. Sex: female Date of Birth: 1970-10-14 Admit Date: 02/14/2018 Admitting Physician Barb Merino, MD HQI:[id number], Provider, MD  Brief Narrative: Patient is a 47 y.o. female quadriplegia-s/p tracheostomy/PEG tube/chronic indwelling Foley catheter in place-chronic pain syndrome on narcotics-multiple recent hospitalization-presented to the ED for evaluation of altered mental status.  She was thought to have acute encephalopathy secondary to polypharmacy and probably from complicated UTI.  All narcotics/sedatives were held-she was started on Fortaz (history of Pseudomonas)-and admitted to the hospitalist service.  See below for further details  Subjective: Completely awake and alert-has pain in her back-and is requesting resumption of narcotics.  Assessment/Plan: Acute encephalopathy: Multifactorial-likely toxic encephalopathy (polypharmacy) and metabolic (possible complicated UTI).  Significantly improved after holding all narcotics/sedatives and initiation of IV antibiotics.  Will need to minimize polypharmacy/adjust dosing of her narcotics/sedative medications-see below.  Complicated UTI: Likely related to chronic indwelling Foley catheter-has prior history of Pseudomonas infections.  Low-grade fever overnight-but leukocytosis downtrending and clinically overall improved-continue Fortaz-await urine culture.  Blood cultures pending as well.    Insulin-dependent DM-2: BG stable with SSI and 10 units of Lantus daily.  Follow and optimize accordingly  History of pacemaker implantation: Per prior records she has a history of sick sinus syndrome-sinus rhythm on telemetry today  Chronic pain syndrome: difficult situation-has history of chronic pain syndrome and claims to be on narcotics chronically-at risk for withdrawal symptoms of narcotics not started-resume oxycodone 5 mg every 4 as needed-have  resumed Neurontin/Cymbalta but at significant lower doses then home doses.  RN instructed to not dose narcotics if patient appears lethargic.  Very difficult situation-best served by minimizing polypharmacy/de-escalating dosing-and consideration by PCP in the outpatient setting to slowly taper off narcotics/sedatives.  Hypokalemia: Replete and recheck.  Anemia: Drop in hemoglobin compared to admission-could be from IV fluids dilution or hemoglobin on admission was elevated due to hemoconcentration.  No evidence of blood loss-likely has chronic normocytic anemia in the setting of chronic disease/chronic sacral decubitus ulcers.  Follow hemoglobin.  Quadriplegia with PEG tube/tracheostomy/chronic indwelling Foley/chronic dysphagia: This is following a cervical injury numerous years back-bedbound-and dependent on family and home health services for care.  Await SLP evaluation-but patient claims that she does take some oral intake.  Stable pressure injury in sacrum, left ischium, right ischium: Appreciate wound care evaluation-continue treat per wound care orders.  DVT Prophylaxis: Prophylactic Heparin  Code Status: Full code  Family Communication: None at bedside  Disposition Plan: Remain inpatient-needs a few more days of hospitalization before consideration of discharge.  Will require resumption of home health services on discharge.  Antimicrobial agents: Anti-infectives (From admission, onward)   Start     Dose/Rate Route Frequency Ordered Stop   02/14/18 1000  cefTAZidime (FORTAZ) 1 g in sodium chloride 0.9 % 100 mL IVPB     1 g 200 mL/hr over 30 Minutes Intravenous Every 8 hours 02/14/18 0941        Procedures: None  CONSULTS:  None  Time spent: 25- minutes-Greater than 50% of this time was spent in counseling, explanation of diagnosis, planning of further management, and coordination of care.  MEDICATIONS: Scheduled Meds: . collagenase   Topical Daily  . DULoxetine  30  mg Oral BID  . gabapentin  100 mg Oral TID  . heparin  5,000 Units Subcutaneous Q8H  . insulin aspart  0-9 Units Subcutaneous Q4H  . insulin detemir  10 Units Subcutaneous QHS  . polyethylene glycol  17 g Oral Daily  . umeclidinium bromide  1 puff Inhalation Daily   Continuous Infusions: . sodium chloride 75 mL/hr at 02/15/18 0414  . cefTAZidime (FORTAZ)  IV 1 g (02/15/18 1046)  . feeding supplement (JEVITY 1.2 CAL) 1,000 mL (02/14/18 1440)   PRN Meds:.acetaminophen **OR** acetaminophen, albuterol, ondansetron **OR** ondansetron (ZOFRAN) IV, oxyCODONE   PHYSICAL EXAM: Vital signs: Vitals:   02/15/18 0403 02/15/18 0405 02/15/18 0734 02/15/18 0855  BP: (!) 85/54 99/61 108/66   Pulse: 96 87 85 97  Resp: 18  18 20   Temp: 100.1 F (37.8 C)  99.9 F (37.7 C)   TempSrc: Oral  Oral   SpO2: 99%  97% 97%  Weight:       Filed Weights   02/14/18 0900  Weight: 59 kg   Body mass index is 28.13 kg/m.   General appearance :Awake, alert, not in any distress.  Chronically sick appearing. HEENT: Atraumatic and Normocephalic Neck: supple Resp:Good air entry bilaterally, no added sounds heard anteriorly CVS: S1 S2 regular, no murmurs.  GI: Bowel sounds present, Non tender and not distended with no gaurding, rigidity or rebound.No organomegaly Extremities: B/L Lower Ext shows no edema, both legs are warm to touch Neurology: Quadriplegia Musculoskeletal:No digital cyanosis Skin:No Rash, warm and dry  I have personally reviewed following labs and imaging studies  LABORATORY DATA: CBC: Recent Labs  Lab 02/14/18 0719 02/14/18 0759 02/15/18 0959  WBC 19.5*  --  13.0*  NEUTROABS 16.2*  --   --   HGB 9.8* 9.9* 8.0*  HCT 32.3* 29.0* 26.0*  MCV 85.9  --  85.2  PLT 656*  --  PLATELET CLUMPS NOTED ON SMEAR, UNABLE TO ESTIMATE    Basic Metabolic Panel: Recent Labs  Lab 02/14/18 0719 02/14/18 0759 02/15/18 0959  NA 132* 129* 136  K 3.3* 3.3* 3.2*  CL 83*  --  91*  CO2 35*  --   31  GLUCOSE 224*  --  156*  BUN 18  --  10  CREATININE 0.52  --  0.56  CALCIUM 10.1  --  9.3  MG  --   --  2.3    GFR: Estimated Creatinine Clearance: 64.2 mL/min (by C-G formula based on SCr of 0.56 mg/dL).  Liver Function Tests: Recent Labs  Lab 02/14/18 0719 02/15/18 0959  AST 18 26  ALT 17 22  ALKPHOS 145* 153*  BILITOT 0.6 0.7  PROT 8.4* 7.2  ALBUMIN 2.8* 2.4*   Recent Labs  Lab 02/14/18 0719  LIPASE 120*   Recent Labs  Lab 02/14/18 0848  AMMONIA 37*    Coagulation Profile: No results for input(s): INR, PROTIME in the last 168 hours.  Cardiac Enzymes: No results for input(s): CKTOTAL, CKMB, CKMBINDEX, TROPONINI in the last 168 hours.  BNP (last 3 results) Recent Labs    04/28/17 1110  PROBNP 62.0    HbA1C: No results for input(s): HGBA1C in the last 72 hours.  CBG: Recent Labs  Lab 02/14/18 1739 02/14/18 2028 02/14/18 2318 02/15/18 0357 02/15/18 0732  GLUCAP 131* 98 123* 122* 148*    Lipid Profile: No results for input(s): CHOL, HDL, LDLCALC, TRIG, CHOLHDL, LDLDIRECT in the last 72 hours.  Thyroid Function Tests: No results for input(s): TSH, T4TOTAL, FREET4, T3FREE, THYROIDAB in the last 72 hours.  Anemia Panel: No results for  input(s): VITAMINB12, FOLATE, FERRITIN, TIBC, IRON, RETICCTPCT in the last 72 hours.  Urine analysis:    Component Value Date/Time   COLORURINE AMBER (A) 02/14/2018 0822   APPEARANCEUR CLOUDY (A) 02/14/2018 0822   LABSPEC 1.023 02/14/2018 0822   PHURINE 7.0 02/14/2018 0822   GLUCOSEU 50 (A) 02/14/2018 0822   GLUCOSEU NEGATIVE 04/28/2017 1121   HGBUR SMALL (A) 02/14/2018 0822   BILIRUBINUR NEGATIVE 02/14/2018 0822   KETONESUR NEGATIVE 02/14/2018 0822   PROTEINUR 100 (A) 02/14/2018 0822   UROBILINOGEN 0.2 04/28/2017 1121   NITRITE NEGATIVE 02/14/2018 0822   LEUKOCYTESUR LARGE (A) 02/14/2018 0822    Sepsis Labs: Lactic Acid, Venous    Component Value Date/Time   LATICACIDVEN 0.9 02/14/2018 1204     MICROBIOLOGY: Recent Results (from the past 240 hour(s))  Urine culture     Status: Abnormal (Preliminary result)   Collection Time: 02/14/18  8:06 AM  Result Value Ref Range Status   Specimen Description URINE, CATHETERIZED  Final   Special Requests   Final    NONE Performed at [facility], [place] [address] [place], [place] [address]    Culture 20,000 COLONIES/mL PSEUDOMONAS AERUGINOSA (A)  Final   Report Status PENDING  Incomplete  Culture, respiratory (non-expectorated)     Status: None (Preliminary result)   Collection Time: 02/14/18  3:30 PM  Result Value Ref Range Status   Specimen Description TRACHEAL ASPIRATE  Final   Special Requests Normal  Final   Gram Stain NO WBC SEEN NO ORGANISMS SEEN   Final   Culture   Final    CULTURE REINCUBATED FOR BETTER GROWTH Performed at [facility], [place] [address], [place], [place] [address]    Report Status PENDING  Incomplete    RADIOLOGY STUDIES/RESULTS: Ct Head Wo Contrast  Result Date: 02/14/2018 CLINICAL DATA:  Altered level of consciousness. EXAM: CT HEAD WITHOUT CONTRAST TECHNIQUE: Contiguous axial images were obtained from the base of the skull through the vertex without intravenous contrast. COMPARISON:  Head CT dated 12/21/2017. FINDINGS: Brain: Ventricles are stable in size and configuration. There is no mass, hemorrhage, edema or other evidence of acute parenchymal abnormality. No extra-axial hemorrhage. Vascular: No hyperdense vessel or unexpected calcification. Skull: Normal. Negative for fracture or focal lesion. Sinuses/Orbits: No acute finding. Other: None. IMPRESSION: Negative head CT. No intracranial mass, hemorrhage or edema. Electronically Signed   By: Franki Cabot M.D.   On: 02/14/2018 08:54   Dg Chest Port 1 View  Result Date: 02/14/2018 CLINICAL DATA:  Altered mental status. EXAM: PORTABLE CHEST 1 VIEW COMPARISON:  01/31/2018 FINDINGS: There is a left chest wall pacer device with leads in the right  atrial appendage and right ventricle. Tracheostomy tube tip is above the carina. Normal heart size. No pleural effusion or edema. Pulmonary vascular congestion is again noted. No airspace opacities. IMPRESSION: 1. No CHF or pneumonia. Electronically Signed   By: Kerby Moors M.D.   On: 02/14/2018 07:29   Dg Chest Port 1 View  Result Date: 01/31/2018 CLINICAL DATA:  Shortness of breath. EXAM: PORTABLE CHEST 1 VIEW COMPARISON:  None. FINDINGS: 1737 hours. The cardio pericardial silhouette is enlarged. There is pulmonary vascular congestion without overt pulmonary edema. Similar appearance of the bibasilar atelectasis or infiltrate. Permanent pacemaker noted. Tracheostomy tube evident. Telemetry leads overlie the chest. IMPRESSION: Stable exam. Cardiomegaly with vascular congestion and bibasilar atelectasis or infiltrate. Electronically Signed   By: Misty Stanley M.D.   On: 01/31/2018 18:47   Dg  Chest Port 1 View  Result Date: 01/29/2018 CLINICAL DATA:  Cough with chronic left shoulder and neck pain x2 hours. Low oxygen saturations. EXAM: PORTABLE CHEST 1 VIEW COMPARISON:  12/24/2017 FINDINGS: Portable AP semi upright view of the chest. Stable cardiomegaly with mild aortic atherosclerosis and central vascular congestion is noted. Streaky parenchymal opacities at the left lung base likely represent areas of parenchymal scarring and/or atelectasis. Lesser degree of atelectasis is suggested at the right base. Left-sided pacemaker apparatus with leads in the right atrium and right ventricle appear stable. A tracheostomy tube tip terminates over the upper third of the trachea in satisfactory position. Partially included spinal fusion hardware is noted bilaterally. Left arm catheter projects up to the axilla. IMPRESSION: 1. Stable cardiomegaly with mild central vascular congestion. Bibasilar atelectasis and/or scarring. 2. Catheter from left arm approach terminates at the level of the axilla. 3. Satisfactory  tracheostomy tube position. Electronically Signed   By: Ashley Royalty M.D.   On: 01/29/2018 15:35   Dg Chest Port 1 View  Result Date: 01/23/2018 CLINICAL DATA:  48 year old female with fever and chronic ventilator dependence EXAM: PORTABLE CHEST 1 VIEW COMPARISON:  Prior chest x-ray 01/13/2018 FINDINGS: Tracheostomy tube remains stable in position. The tip is midline and at the level of the clavicles. Stable position of left subclavian approach cardiac rhythm maintenance device with leads projecting over the right atrium and ventricle. Unchanged cardiomegaly. Similar appearance of patchy bibasilar and left mid lung airspace opacities. Background bronchitic changes are also similar. No definite new acute airspace disease. No pneumothorax. No acute osseous abnormality. IMPRESSION: 1. Stable support apparatus. 2. Similar appearance of patchy airspace opacities in both lung bases and the left mid lung compared to 01/13/2018. Differential considerations include persistent multilobar pneumonia versus chronic atelectasis/scarring. 3. Electronically Signed   By: Jacqulynn Cadet M.D.   On: 01/23/2018 11:49     LOS: 1 day   Oren Binet, MD  [facility]  If 7PM-7AM, please contact night-coverage  Please page via [email or web address]-Password [email or web address]-click on MD name and type text message  02/15/2018, 11:52 AM

## 2018-02-15 NOTE — Care Management Note (Signed)
Case Management Note  Patient Details  Name: Angela Page MRN: [id number] Date of Birth: [birth year]  Subjective/Objective:   Pt in with acute metabolic encephalopathy. She is from home with her daughter and mother.  DME: air bed, wheelchair, TF pump (receives feeding from 11:30 pm until 7:00 am). No issues obtaining her meds. Pt has Psychologist, prison and probation services 7am-3pm and 11pm-7am daily. Her mother provides the care 3p-11pm.                 Action/Plan: CM following for d/c needs, physician orders.  Expected Discharge Date:                  Expected Discharge Plan:  [place]  In-House Referral:     Discharge planning Services  CM Consult  Post Acute Care Choice:    Choice offered to:  Patient  DME Arranged:    DME Agency:     HH Arranged:  RN [place] Agency:  [place]  Status of Service:  In process, will continue to follow  If discussed at Long Length of Stay Meetings, dates discussed:    Additional Comments:  Pollie Friar, RN 02/15/2018, 12:35 PM

## 2018-02-15 NOTE — Progress Notes (Signed)
  Speech Language Pathology Treatment: Dysphagia  Patient Details Name: Angela Page MRN: [id number] DOB: 22-Mar-1970 Today's Date: 02/15/2018 Time: 5188-4166 SLP Time Calculation (min) (ACUTE ONLY): 18 min  Assessment / Plan / Recommendation Clinical Impression  Pt is more alert and oriented today as compared to evaluation on previous date and she does not have overt coughing. She does not have her dentures, but with extra time, can masticate softened foods well. She is eager to resume a PO diet. Pt has had adequate oropharyngeal function on more recent swallow studies, but she still has a variety of risk factors for aspiration (suspected esophageal component, dependence for feeding, positioning, suspected fluctuating mentation given medications she takes). We reviewed this, as well as aspiration precautions and the importance of following them. Will start Dys 2 diet and thin liquids with additional SLP f/u warranted for tolerance and reinforcement.   HPI HPI: Pt is a 48 y.o. female admitted with AMS. Polypharmacy suspected per MD H&P note, although UTI is also in the differential. CT Head and CXR negative. She was seen most recently by SLP 02/03/18 with recommendation for free water protocol only. She has a h/o chronic dysphagia s/p PEG but has been on oral diets. Most recent FEES in May 2018 recommended Dys 3 diet, thin liquids with use of chin tuck. PMH includes: chronic trach, quadriplegia, PNA, GERD, DM, substance abuse      SLP Plan  Continue with current plan of care       Recommendations  Diet recommendations: Dysphagia 2 (fine chop);Thin liquid Liquids provided via: Cup;Straw Medication Administration: Via alternative means Supervision: Staff to assist with self feeding;Full supervision/cueing for compensatory strategies Compensations: Small sips/bites;Multiple dry swallows after each bite/sip Postural Changes and/or Swallow Maneuvers: Seated upright 90 degrees;Upright 30-60  min after meal                Oral Care Recommendations: Oral care BID Follow up Recommendations: (tba) SLP Visit Diagnosis: Dysphagia, unspecified (R13.10) Plan: Continue with current plan of care       GO                Venita Sheffield Shyloh Krinke 02/15/2018, 12:21 PM  Nuala Alpha, M.A. [place] Acute Environmental education officer [phone] Office [phone]

## 2018-02-15 NOTE — Progress Notes (Signed)
Initial Nutrition Assessment  DOCUMENTATION CODES:   Not applicable  INTERVENTION:   -D/C Jevity 1.2  -Initiate Osmolite 1.5 @ 33ml/hr with 31ml Pro-stat once daily to provide 1820 kcal, 97.7 g protein, and 923 ml free water.  NUTRITION DIAGNOSIS:   Increased nutrient needs related to wound healing as evidenced by estimated needs.  GOAL:   Patient will meet greater than or equal to 90% of their needs  MONITOR:   PO intake, Supplement acceptance, TF tolerance, Weight trends  REASON FOR ASSESSMENT:   New TF   ASSESSMENT:   Pt with PMH of functional quadriplegia, s/p trach and PEG, hx of polysubstance abuse, sacral ulcers, T2DM and GERD. Presented to ED 1/26 d/t altered mental status.   Spoke with pt at bedside who was awake and alert. She reported discomfort with the current TF (Jev 1.2, goal 42ml/hr). At goal rate TF is providing 1440 kcal( 87% est needs), 66.6 g protein(74% est needs) , and 968.75ml free water. Stomach appeared distended; pt reports that at home she only runs feeds at night (midnight - 7am) with Glucerna at 81ml/hr. Pt was very upset when talking about the stomach pain.   Pt is now on D2; she says that she was able to eat a few bites of mashed potatoes but the abdominal pain prevented her from eating anymore. Per pt chart meal completion was 10%. Pt reported that she eats scrambled eggs, oatmeal, and hot pockets at home. Expressed her dislike of fruits and vegetables.   RD will switch TF formula to Osm 1.5 and decrease the rate to help alleviate abdominal distention.    Medications reviewed and include: insulin aspart 0-9 units q 4 hours, insulin detemir 10 units daily at bedtime, miralax 17g daily Labs reviewed:  CBG (last 3)  Recent Labs    02/15/18 0357 02/15/18 0732 02/15/18 1228  GLUCAP 122* 148* 155*     NUTRITION - FOCUSED PHYSICAL EXAM:    Most Recent Value  Orbital Region  No depletion  Upper Arm Region  No depletion  Thoracic and Lumbar  Region  Unable to assess  Buccal Region  No depletion  Temple Region  No depletion  Clavicle Bone Region  No depletion  Clavicle and Acromion Bone Region  No depletion  Scapular Bone Region  Unable to assess  Dorsal Hand  No depletion  Patellar Region  No depletion  Anterior Thigh Region  No depletion  Posterior Calf Region  No depletion  Edema (RD Assessment)  None  Hair  Reviewed  Eyes  Reviewed  Mouth  Reviewed  Skin  Reviewed  Nails  Reviewed      Diet Order:   Diet Order            DIET DYS 2 Room service appropriate? 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  Estimated Nutritional Needs:   Kcal:  5885-0277  Protein:  90-100g  Fluid:  >/= 1.65L    [place] Intern

## 2018-02-15 NOTE — Consult Note (Addendum)
[place] Nurse wound consult note Reason for Consult: Consult requested for sacrum and bilat ischium.  Pt is familiar to [facility] team from recent admission; refer to progress notes on 1/14. Wound type: Sacrum with unstageable pressure injury; 5X7X.2cm, 100% slough, small amt tan drainage, no odor or fluctuance. Left ischium 4X2X.2cm, stage 4 pressure injury bone palpable with swab, red moist wound bed, mod amt tan drainage, no odor.   Right ischium with stage 4 pressure injury bone palpable with swab, red moist wound bed,5X3X1cm mod amt tan drainage, no odor.   Pressure Injury POA: Yes Dressing procedure/placement/frequency: Santyl ointment to provide enzymatic debridement of nonviable tissue to sacrum.  Aquacel to absorb drainage and promote healing to bilat ischium wounds with foam dressings to protect.  Pt is on a low-air loss bed to reduce pressure. Discussed plan of care with patient; no family members present. Please re-consult if further assistance is needed.  Thank-you,  Julien Girt MSN, [place], [place], [place], [place]

## 2018-02-15 NOTE — Progress Notes (Signed)
Unable to obtain patient's weight as the bed is not calibrated well. Pt is currently on Air-Mattress and the bed scale is not reading appropriately

## 2018-02-15 NOTE — Progress Notes (Signed)
Alert/oriented X4. Very tearful and requesting for oxycodone. The provider on-call was able to order gabapentin, baclofen, melatonin for her which has been administered with some relief. She literally wants someone in the room to talk to. Staff members have been in the room including respiratory therapist several times. Currently, pt is stable, no acute distress noted. Will continue monitoring.

## 2018-02-16 LAB — CBC
HCT: 27.6 % — ABNORMAL LOW (ref 36.0–46.0)
Hemoglobin: 8.3 g/dL — ABNORMAL LOW (ref 12.0–15.0)
MCH: 26.1 pg (ref 26.0–34.0)
MCHC: 30.1 g/dL (ref 30.0–36.0)
MCV: 86.8 fL (ref 80.0–100.0)
Platelets: 594 10*3/uL — ABNORMAL HIGH (ref 150–400)
RBC: 3.18 MIL/uL — ABNORMAL LOW (ref 3.87–5.11)
RDW: 16.9 % — ABNORMAL HIGH (ref 11.5–15.5)
WBC: 11.5 10*3/uL — ABNORMAL HIGH (ref 4.0–10.5)
nRBC: 0 % (ref 0.0–0.2)

## 2018-02-16 LAB — BASIC METABOLIC PANEL
Anion gap: 14 (ref 5–15)
BUN: 6 mg/dL (ref 6–20)
CO2: 26 mmol/L (ref 22–32)
Calcium: 8.8 mg/dL — ABNORMAL LOW (ref 8.9–10.3)
Chloride: 94 mmol/L — ABNORMAL LOW (ref 98–111)
Creatinine, Ser: 0.46 mg/dL (ref 0.44–1.00)
GFR calc Af Amer: >60 mL/min (ref >60)
Glucose, Bld: 90 mg/dL (ref 70–99)
Potassium: 4 mmol/L (ref 3.5–5.1)
Sodium: 134 mmol/L — ABNORMAL LOW (ref 135–145)

## 2018-02-16 LAB — GLUCOSE, CAPILLARY
GLUCOSE-CAPILLARY: 112 mg/dL — AB (ref 70–99)
GLUCOSE-CAPILLARY: 97 mg/dL (ref 70–99)
Glucose-Capillary: 103 mg/dL — ABNORMAL HIGH (ref 70–99)
Glucose-Capillary: 104 mg/dL — ABNORMAL HIGH (ref 70–99)
Glucose-Capillary: 87 mg/dL (ref 70–99)
Glucose-Capillary: 89 mg/dL (ref 70–99)
Glucose-Capillary: 93 mg/dL (ref 70–99)

## 2018-02-16 LAB — URINE CULTURE: Culture: 20000 — AB

## 2018-02-16 MED ORDER — FLUCONAZOLE 100MG IVPB
100.0000 mg | INTRAVENOUS | Status: DC
  Administered 2018-02-16 – 2018-02-18 (×3): 100 mg via INTRAVENOUS
  Filled 2018-02-16 (×3): qty 50

## 2018-02-16 MED ORDER — GLUCERNA 1.2 CAL PO LIQD
1000.0000 mL | ORAL | Status: DC
  Administered 2018-02-17 – 2018-02-18 (×2): 1000 mL
  Filled 2018-02-16 (×2): qty 1000

## 2018-02-16 MED ORDER — BACLOFEN 10 MG PO TABS
20.0000 mg | ORAL_TABLET | Freq: Three times a day (TID) | ORAL | Status: DC | PRN
  Administered 2018-02-16 – 2018-02-18 (×4): 20 mg via ORAL
  Filled 2018-02-16 (×4): qty 2

## 2018-02-16 NOTE — Progress Notes (Signed)
MD requested that the suprapubic catheter is changed due to infection. Patient requires 20 french catheter with 10 cc balloon. However, the correct catheter/ balloon size with a tray is not in stock. According to trained staff, in order to replace a suprapubic catheter the exact catheter and balloon size must be used if there is no MD order that states otherwise. The catheter must also come attached to a drainage bag and in a sterile Foley tray. Multiple attempts have been made to obtain the correct catheter and balloon size in a tray. Only the catheter has been delivered without a tray or bag attached. Will continue to attempt to obtain the correct set up.

## 2018-02-16 NOTE — Progress Notes (Signed)
PROGRESS NOTE        PATIENT DETAILS Name: Tanasha Menees Age: 48 y.o. Sex: female Date of Birth: 30-Nov-1970 Admit Date: 02/14/2018 Admitting Physician Barb Merino, MD UJW:[id number], Provider, MD  Brief Narrative: Patient is a 48 y.o. female quadriplegia-s/p tracheostomy/PEG tube/chronic indwelling Foley catheter in place-chronic pain syndrome on narcotics-multiple recent hospitalization-presented to the ED for evaluation of altered mental status.  She was thought to have acute encephalopathy secondary to polypharmacy and probably from complicated UTI.  All narcotics/sedatives were held-she was started on Fortaz (history of Pseudomonas)-and admitted to the hospitalist service.  See below for further details  Subjective: Awake-alert-requesting resumption of Ambien-claims has not slept in 2 days.  Assessment/Plan: Acute encephalopathy: Multifactorial-likely toxic encephalopathy (polypharmacy) and metabolic (possible complicated UTI).  Significantly improved-initially all narcotics/sedatives were held-she was started on IV antibiotics.  On 1/27-narcotics were resumed at a lower dose, some of her sedative/psych meds were also resumed at a much lower dose.  She is awake and alert without any excess sedation this morning.  Continue to minimize polypharmacy/adjust dosing of her narcotics/sedatives-she will benefit from slow taper of this medications in the outpatient setting by her PCP.    Complicated UTI: Likely related to chronic indwelling Foley catheter-has prior history of Pseudomonas infections.  Did have low-grade fever and leukocytosis on admission-but clinically improved-now afebrile-continue Tressie Ellis.  Culture positive for Pseudomonas and yeast-given chronic Foley status-we will start Diflucan and continue [place].  Have asked RN to see if he can get his suprapubic catheter changed.  Insulin-dependent DM-2: CBGs stable with 10 units of Lantus daily and SSI.   Follow and optimize accordingly.    History of pacemaker implantation: Per prior records she has a history of sick sinus syndrome-sinus rhythm on telemetry today  Chronic pain syndrome: has history of chronic pain syndrome and claims to be on narcotics chronically-since at risk for withdrawal symptoms of narcotics held for a prolonged period of time-noted on low doses of oxycodone 5 mg every 4 hours as needed-have also resumed Neurontin and Cymbalta at lower doses than her home dose.  Her pain seems to be reasonably well controlled with this current regimen.Difficult situation-we will need minimizing polypharmacy/de-escalating dosing for narcotics/sedatives-by PCP in the outpatient setting by slowly tapering these medications.  Hypokalemia: Repleted, recheck periodically.  Anemia: Drop in hemoglobin compared to admission-likely secondary to IV fluid dilution-probably hemoglobin on admission was elevated due to hemoconcentration.  Hemoglobin is stable-and close to her levels from her most recent hospitalization.  No evidence of overt blood loss.  Suspect she has chronic normocytic anemia in the setting of chronic disease/chronic sacral decubitus ulcers.  Follow hemoglobin periodically  Quadriplegia with PEG tube/tracheostomy/chronic indwelling Foley/chronic dysphagia: This is following a cervical injury numerous years back-bedbound-and dependent on family and home health services for care.  Appreciate SLP evaluation-continue dysphagia 2 diet-have asked RN to discuss with nutrition to see if her PEG tube feedings can mimic her home regimen.    Stable pressure injury in sacrum, left ischium, right ischium: Appreciate wound care evaluation-continue treat per wound care orders.  DVT Prophylaxis: Prophylactic Heparin  Code Status: Full code  Family Communication: None at bedside  Disposition Plan: Remain inpatient-home in the next few days with home health services.  Antimicrobial  agents: Anti-infectives (From admission, onward)   Start     Dose/Rate Route Frequency Ordered Stop  02/14/18 1000  cefTAZidime (FORTAZ) 1 g in sodium chloride 0.9 % 100 mL IVPB     1 g 200 mL/hr over 30 Minutes Intravenous Every 8 hours 02/14/18 0941        Procedures: None  CONSULTS:  None  Time spent: 25- minutes-Greater than 50% of this time was spent in counseling, explanation of diagnosis, planning of further management, and coordination of care.  MEDICATIONS: Scheduled Meds: . collagenase   Topical Daily  . DULoxetine  30 mg Oral BID  . feeding supplement (PRO-STAT SUGAR FREE 64)  30 mL Per Tube BID  . gabapentin  100 mg Oral TID  . heparin  5,000 Units Subcutaneous Q8H  . insulin aspart  0-9 Units Subcutaneous Q4H  . insulin detemir  10 Units Subcutaneous QHS  . polyethylene glycol  17 g Oral Daily  . umeclidinium bromide  1 puff Inhalation Daily   Continuous Infusions: . sodium chloride 10 mL/hr at 02/15/18 1449  . cefTAZidime (FORTAZ)  IV 1 g (02/16/18 0852)  . feeding supplement (OSMOLITE 1.5 CAL)     PRN Meds:.acetaminophen **OR** acetaminophen, albuterol, ondansetron **OR** ondansetron (ZOFRAN) IV, oxyCODONE   PHYSICAL EXAM: Vital signs: Vitals:   02/16/18 0455 02/16/18 0707 02/16/18 0713 02/16/18 0745  BP: 105/72   107/69  Pulse: 81 83  79  Resp: 17 18  20   Temp: 98.6 F (37 C)   99.1 F (37.3 C)  TempSrc: Oral   Oral  SpO2: 100% 100% 100% 100%  Weight:       Filed Weights   02/14/18 0900  Weight: 59 kg   Body mass index is 28.13 kg/m.   General appearance:Awake, alert, not in any distress.  Eyes:no scleral icterus. HEENT: Atraumatic and Normocephalic Neck: supple, no JVD. Resp:Good air entry bilaterally,no rales or rhonchi CVS: S1 S2 regular, no murmurs.  GI: Bowel sounds present, Non tender and not distended with no gaurding, rigidity or rebound. Neurology: Quadriplegic at baseline Psychiatric: Normal judgment and insight. Normal  mood. Musculoskeletal:No digital cyanosis Skin:No Rash, warm and dry Wounds:N/A  I have personally reviewed following labs and imaging studies  LABORATORY DATA: CBC: Recent Labs  Lab 02/14/18 0719 02/14/18 0759 02/15/18 0959 02/16/18 0454  WBC 19.5*  --  13.0* 11.5*  NEUTROABS 16.2*  --   --   --   HGB 9.8* 9.9* 8.0* 8.3*  HCT 32.3* 29.0* 26.0* 27.6*  MCV 85.9  --  85.2 86.8  PLT 656*  --  PLATELET CLUMPS NOTED ON SMEAR, UNABLE TO ESTIMATE 594*    Basic Metabolic Panel: Recent Labs  Lab 02/14/18 0719 02/14/18 0759 02/15/18 0959 02/16/18 0454  NA 132* 129* 136 134*  K 3.3* 3.3* 3.2* 4.0  CL 83*  --  91* 94*  CO2 35*  --  31 26  GLUCOSE 224*  --  156* 90  BUN 18  --  10 6  CREATININE 0.52  --  0.56 0.46  CALCIUM 10.1  --  9.3 8.8*  MG  --   --  2.3  --     GFR: Estimated Creatinine Clearance: 64.2 mL/min (by C-G formula based on SCr of 0.46 mg/dL).  Liver Function Tests: Recent Labs  Lab 02/14/18 0719 02/15/18 0959  AST 18 26  ALT 17 22  ALKPHOS 145* 153*  BILITOT 0.6 0.7  PROT 8.4* 7.2  ALBUMIN 2.8* 2.4*   Recent Labs  Lab 02/14/18 0719  LIPASE 120*   Recent Labs  Lab 02/14/18 0848  AMMONIA  37*    Coagulation Profile: No results for input(s): INR, PROTIME in the last 168 hours.  Cardiac Enzymes: No results for input(s): CKTOTAL, CKMB, CKMBINDEX, TROPONINI in the last 168 hours.  BNP (last 3 results) Recent Labs    04/28/17 1110  PROBNP 62.0    HbA1C: No results for input(s): HGBA1C in the last 72 hours.  CBG: Recent Labs  Lab 02/15/18 1627 02/15/18 2034 02/16/18 0143 02/16/18 0450 02/16/18 0740  GLUCAP 139* 118* 93 87 112*    Lipid Profile: No results for input(s): CHOL, HDL, LDLCALC, TRIG, CHOLHDL, LDLDIRECT in the last 72 hours.  Thyroid Function Tests: No results for input(s): TSH, T4TOTAL, FREET4, T3FREE, THYROIDAB in the last 72 hours.  Anemia Panel: No results for input(s): VITAMINB12, FOLATE, FERRITIN, TIBC,  IRON, RETICCTPCT in the last 72 hours.  Urine analysis:    Component Value Date/Time   COLORURINE AMBER (A) 02/14/2018 0822   APPEARANCEUR CLOUDY (A) 02/14/2018 0822   LABSPEC 1.023 02/14/2018 0822   PHURINE 7.0 02/14/2018 0822   GLUCOSEU 50 (A) 02/14/2018 0822   GLUCOSEU NEGATIVE 04/28/2017 1121   HGBUR SMALL (A) 02/14/2018 0822   BILIRUBINUR NEGATIVE 02/14/2018 0822   KETONESUR NEGATIVE 02/14/2018 0822   PROTEINUR 100 (A) 02/14/2018 0822   UROBILINOGEN 0.2 04/28/2017 1121   NITRITE NEGATIVE 02/14/2018 0822   LEUKOCYTESUR LARGE (A) 02/14/2018 0822    Sepsis Labs: Lactic Acid, Venous    Component Value Date/Time   LATICACIDVEN 0.9 02/14/2018 1204    MICROBIOLOGY: Recent Results (from the past 240 hour(s))  Blood Culture (routine x 2)     Status: None (Preliminary result)   Collection Time: 02/14/18  7:30 AM  Result Value Ref Range Status   Specimen Description BLOOD LEFT HAND  Final   Special Requests   Final    BOTTLES DRAWN AEROBIC AND ANAEROBIC Blood Culture results may not be optimal due to an excessive volume of blood received in culture bottles   Culture   Final    NO GROWTH 1 DAY Performed at [facility], [place] [address] [place], [place] [address]    Report Status PENDING  Incomplete  Blood Culture (routine x 2)     Status: None (Preliminary result)   Collection Time: 02/14/18  7:30 AM  Result Value Ref Range Status   Specimen Description BLOOD RIGHT HAND  Final   Special Requests   Final    BOTTLES DRAWN AEROBIC AND ANAEROBIC Blood Culture adequate volume   Culture   Final    NO GROWTH 1 DAY Performed at [facility], [place] [address], [place], [place] [address]    Report Status PENDING  Incomplete  Urine culture     Status: Abnormal   Collection Time: 02/14/18  8:06 AM  Result Value Ref Range Status   Specimen Description URINE, CATHETERIZED  Final   Special Requests   Final    NONE Performed at [facility], [place] [address]  [place], [place] [address]    Culture (A)  Final    20,000 COLONIES/mL PSEUDOMONAS AERUGINOSA 50,000 COLONIES/mL YEAST    Report Status 02/16/2018 FINAL  Final   Organism ID, Bacteria PSEUDOMONAS AERUGINOSA (A)  Final      Susceptibility   Pseudomonas aeruginosa - MIC*    CEFTAZIDIME 4 SENSITIVE Sensitive     CIPROFLOXACIN >=4 RESISTANT Resistant     GENTAMICIN 8 INTERMEDIATE Intermediate     IMIPENEM 1 SENSITIVE Sensitive     PIP/TAZO  8 SENSITIVE Sensitive     CEFEPIME 4 SENSITIVE Sensitive     * 20,000 COLONIES/mL PSEUDOMONAS AERUGINOSA  Culture, respiratory (non-expectorated)     Status: None (Preliminary result)   Collection Time: 02/14/18  3:30 PM  Result Value Ref Range Status   Specimen Description TRACHEAL ASPIRATE  Final   Special Requests Normal  Final   Gram Stain NO WBC SEEN NO ORGANISMS SEEN   Final   Culture   Final    CULTURE REINCUBATED FOR BETTER GROWTH Performed at [facility], [place] [address] [place], [place] [address]    Report Status PENDING  Incomplete    RADIOLOGY STUDIES/RESULTS: Ct Head Wo Contrast  Result Date: 02/14/2018 CLINICAL DATA:  Altered level of consciousness. EXAM: CT HEAD WITHOUT CONTRAST TECHNIQUE: Contiguous axial images were obtained from the base of the skull through the vertex without intravenous contrast. COMPARISON:  Head CT dated 12/21/2017. FINDINGS: Brain: Ventricles are stable in size and configuration. There is no mass, hemorrhage, edema or other evidence of acute parenchymal abnormality. No extra-axial hemorrhage. Vascular: No hyperdense vessel or unexpected calcification. Skull: Normal. Negative for fracture or focal lesion. Sinuses/Orbits: No acute finding. Other: None. IMPRESSION: Negative head CT. No intracranial mass, hemorrhage or edema. Electronically Signed   By: Franki Cabot M.D.   On: 02/14/2018 08:54   Dg Chest Port 1 View  Result Date: 02/14/2018 CLINICAL DATA:  Altered mental status. EXAM: PORTABLE CHEST 1 VIEW  COMPARISON:  01/31/2018 FINDINGS: There is a left chest wall pacer device with leads in the right atrial appendage and right ventricle. Tracheostomy tube tip is above the carina. Normal heart size. No pleural effusion or edema. Pulmonary vascular congestion is again noted. No airspace opacities. IMPRESSION: 1. No CHF or pneumonia. Electronically Signed   By: Kerby Moors M.D.   On: 02/14/2018 07:29   Dg Chest Port 1 View  Result Date: 01/31/2018 CLINICAL DATA:  Shortness of breath. EXAM: PORTABLE CHEST 1 VIEW COMPARISON:  None. FINDINGS: 1737 hours. The cardio pericardial silhouette is enlarged. There is pulmonary vascular congestion without overt pulmonary edema. Similar appearance of the bibasilar atelectasis or infiltrate. Permanent pacemaker noted. Tracheostomy tube evident. Telemetry leads overlie the chest. IMPRESSION: Stable exam. Cardiomegaly with vascular congestion and bibasilar atelectasis or infiltrate. Electronically Signed   By: Misty Stanley M.D.   On: 01/31/2018 18:47   Dg Chest Port 1 View  Result Date: 01/29/2018 CLINICAL DATA:  Cough with chronic left shoulder and neck pain x2 hours. Low oxygen saturations. EXAM: PORTABLE CHEST 1 VIEW COMPARISON:  12/24/2017 FINDINGS: Portable AP semi upright view of the chest. Stable cardiomegaly with mild aortic atherosclerosis and central vascular congestion is noted. Streaky parenchymal opacities at the left lung base likely represent areas of parenchymal scarring and/or atelectasis. Lesser degree of atelectasis is suggested at the right base. Left-sided pacemaker apparatus with leads in the right atrium and right ventricle appear stable. A tracheostomy tube tip terminates over the upper third of the trachea in satisfactory position. Partially included spinal fusion hardware is noted bilaterally. Left arm catheter projects up to the axilla. IMPRESSION: 1. Stable cardiomegaly with mild central vascular congestion. Bibasilar atelectasis and/or  scarring. 2. Catheter from left arm approach terminates at the level of the axilla. 3. Satisfactory tracheostomy tube position. Electronically Signed   By: Ashley Royalty M.D.   On: 01/29/2018 15:35   Dg Chest Port 1 View  Result Date: 01/23/2018 CLINICAL DATA:  48 year old female with fever and chronic ventilator dependence  EXAM: PORTABLE CHEST 1 VIEW COMPARISON:  Prior chest x-ray 01/13/2018 FINDINGS: Tracheostomy tube remains stable in position. The tip is midline and at the level of the clavicles. Stable position of left subclavian approach cardiac rhythm maintenance device with leads projecting over the right atrium and ventricle. Unchanged cardiomegaly. Similar appearance of patchy bibasilar and left mid lung airspace opacities. Background bronchitic changes are also similar. No definite new acute airspace disease. No pneumothorax. No acute osseous abnormality. IMPRESSION: 1. Stable support apparatus. 2. Similar appearance of patchy airspace opacities in both lung bases and the left mid lung compared to 01/13/2018. Differential considerations include persistent multilobar pneumonia versus chronic atelectasis/scarring. 3. Electronically Signed   By: Jacqulynn Cadet M.D.   On: 01/23/2018 11:49     LOS: 2 days   Oren Binet, MD  [facility]  If 7PM-7AM, please contact night-coverage  Please page via [email or web address]-Password [email or web address]-click on MD name and type text message  02/16/2018, 9:56 AM

## 2018-02-16 NOTE — Progress Notes (Signed)
  Speech Language Pathology Treatment: Dysphagia  Patient Details Name: Angela Page MRN: [id number] DOB: May 21, 1970 Today's Date: 02/16/2018 Time: 2952-8413 SLP Time Calculation (min) (ACUTE ONLY): 11 min  Assessment / Plan / Recommendation Clinical Impression  Pt was seen for dysphagia treatment to assess pt's tolerance of the current diet. Pt reported that she did not eat breakfast of much of dinner because she could not masticate the food. Per the pt, her family will not be able to bring her dentures and she will therefore like to be on a dysphagia diet. Pt tolerated one bolus of puree solids and consecutive swallows of thin liquids via straw without overt s/sx of aspiration. She refused additional p.o. intake due to her not being hungry. No significant oral residue was noted and symptoms of pharyngeal residue were not observed. Pt's diet will be downgraded to dysphagia 1 with thin liquids at this time per the pt's preference. SLP will continue to follow pt.    HPI HPI: Pt is a 48 y.o. female admitted with AMS. Polypharmacy suspected per MD H&P note, although UTI is also in the differential. CT Head and CXR negative. She was seen most recently by SLP 02/03/18 with recommendation for free water protocol only. She has a h/o chronic dysphagia s/p PEG but has been on oral diets. Most recent FEES in May 2018 recommended Dys 3 diet, thin liquids with use of chin tuck. PMH includes: chronic trach, quadriplegia, PNA, GERD, DM, substance abuse      SLP Plan  Goals updated       Recommendations  Diet recommendations: Dysphagia 1 (puree);Thin liquid Liquids provided via: Straw Medication Administration: Via alternative means Supervision: Staff to assist with self feeding;Full supervision/cueing for compensatory strategies Compensations: Small sips/bites;Multiple dry swallows after each bite/sip Postural Changes and/or Swallow Maneuvers: Seated upright 90 degrees;Upright 30-60 min after meal       Patient may use Passy-Muir Speech Valve: During PO intake/meals PMSV Supervision: Intermittent         Oral Care Recommendations: Oral care BID Follow up Recommendations: Skilled Nursing facility SLP Visit Diagnosis: Dysphagia, unspecified (R13.10) Plan: Goals updated       Dora Clauss I. Hardin Negus, [place], [place] Office number [phone] Pager [place] 02/16/2018, 12:55 PM

## 2018-02-16 NOTE — Progress Notes (Signed)
Patient refused flutter at this time. NO distress noted.  Patient also wants to stay on PMV at this time and does not wish to use trach collar yet. States she will probably not sleep tonight.

## 2018-02-16 NOTE — Progress Notes (Signed)
Nutrition Follow-up  DOCUMENTATION CODES:   Not applicable  INTERVENTION:  Resume home tube feeding regimen: Initiate nocturnal tube feeds via PEG using Glucerna 1.2 formula at goal rate of 40 ml/hr x 7 hours (0000-0700).  Provide 30 ml Prostat (or equivalent) BID per tube.   Tube feeding regimen provides 536 kcal, 47 grams of protein, 227 ml water.   Encourage PO intake at meals.   NUTRITION DIAGNOSIS:   Increased nutrient needs related to wound healing as evidenced by estimated needs; ongoing  GOAL:   Patient will meet greater than or equal to 90% of their needs; progressing  MONITOR:   PO intake, Supplement acceptance, TF tolerance, Weight trends  REASON FOR ASSESSMENT:   New TF    ASSESSMENT:   Pt with PMH of functional quadriplegia, s/p trach and PEG, hx of polysubstance abuse, sacral ulcers, T2DM and GERD. Presented to ED 1/26 d/t altered mental status.   Pt currently on a dysphagia 1 diet with thin liquids. Tube feeding has been stopped due to abdominal distention and discomfort. Pt only able to consume bites of food at meals due to no dentures present. Pt reports dentures are at home and family unable to bring it in the hospital to her. Pt requesting for tube feeding orders to resume back to normal home nocturnal tube feeding regimen, which will aid in po intake during the day.   Diet Order:   Diet Order            DIET - DYS 1 Room service appropriate? Yes; Fluid consistency: Thin  Diet effective now              EDUCATION NEEDS:   No education needs have been identified at this time  Skin:  Skin Assessment: Skin Integrity Issues: Skin Integrity Issues:: Stage IV, Unstageable Stage IV: L and R ischial tuberosity Unstageable: medial sacrum  Last BM:  1/27  Height:   Ht Readings from Last 1 Encounters:  12/17/17 4\' 9"  (1.448 m)    Weight:   Wt Readings from Last 1 Encounters:  02/14/18 59 kg    Ideal Body Weight:  37.78 kg  BMI:  Body  mass index is 28.13 kg/m.  Estimated Nutritional Needs:   Kcal:  1650-1850  Protein:  90-100g  Fluid:  >/= 1.65L    Corrin Parker, MS, RD, LDN Pager # [phone] After hours/ weekend pager # [phone]

## 2018-02-17 LAB — BASIC METABOLIC PANEL
Anion gap: 13 (ref 5–15)
BUN: 7 mg/dL (ref 6–20)
CO2: 21 mmol/L — AB (ref 22–32)
Calcium: 9 mg/dL (ref 8.9–10.3)
Chloride: 100 mmol/L (ref 98–111)
Creatinine, Ser: 0.45 mg/dL (ref 0.44–1.00)
GFR calc Af Amer: >60 mL/min (ref >60)
GFR calc non Af Amer: >60 mL/min (ref >60)
Glucose, Bld: 99 mg/dL (ref 70–99)
Potassium: 4.2 mmol/L (ref 3.5–5.1)
Sodium: 134 mmol/L — ABNORMAL LOW (ref 135–145)

## 2018-02-17 LAB — CBC
HCT: 31.1 % — ABNORMAL LOW (ref 36.0–46.0)
Hemoglobin: 9.4 g/dL — ABNORMAL LOW (ref 12.0–15.0)
MCH: 26.5 pg (ref 26.0–34.0)
MCHC: 30.2 g/dL (ref 30.0–36.0)
MCV: 87.6 fL (ref 80.0–100.0)
Platelets: 572 10*3/uL — ABNORMAL HIGH (ref 150–400)
RBC: 3.55 MIL/uL — ABNORMAL LOW (ref 3.87–5.11)
RDW: 16.7 % — ABNORMAL HIGH (ref 11.5–15.5)
WBC: 15.2 10*3/uL — ABNORMAL HIGH (ref 4.0–10.5)
nRBC: 0 % (ref 0.0–0.2)

## 2018-02-17 LAB — GLUCOSE, CAPILLARY
Glucose-Capillary: 88 mg/dL (ref 70–99)
Glucose-Capillary: 110 mg/dL — ABNORMAL HIGH (ref 70–99)
Glucose-Capillary: 101 mg/dL — ABNORMAL HIGH (ref 70–99)
Glucose-Capillary: 110 mg/dL — ABNORMAL HIGH (ref 70–99)
Glucose-Capillary: 95 mg/dL (ref 70–99)

## 2018-02-17 MED ORDER — FLUCONAZOLE 100 MG PO TABS: 100.0000 mg | ORAL_TABLET | Freq: Every day | ORAL | 0 refills | Status: AC

## 2018-02-17 MED ORDER — UMECLIDINIUM BROMIDE 62.5 MCG/INH IN AEPB: 1.0000 | INHALATION_SPRAY | Freq: Every day | RESPIRATORY_TRACT | 0 refills | Status: DC

## 2018-02-17 MED ORDER — INSULIN DETEMIR 100 UNIT/ML ~~LOC~~ SOLN: 10.0000 [IU] | Freq: Every day | SUBCUTANEOUS | 11 refills | Status: DC

## 2018-02-17 MED ORDER — PRO-STAT SUGAR FREE PO LIQD: 30.0000 mL | Freq: Two times a day (BID) | ORAL | 0 refills | Status: DC

## 2018-02-17 MED ORDER — GABAPENTIN 100 MG PO CAPS: 100.0000 mg | ORAL_CAPSULE | Freq: Three times a day (TID) | ORAL | 0 refills | Status: DC

## 2018-02-17 MED ORDER — BACLOFEN 20 MG PO TABS: 20.0000 mg | ORAL_TABLET | Freq: Three times a day (TID) | ORAL | 0 refills | Status: DC | PRN

## 2018-02-17 MED ORDER — GLUCERNA 1.2 CAL PO LIQD: 1000.0000 mL | ORAL | Status: DC

## 2018-02-17 MED ORDER — OXYCODONE HCL 5 MG PO TABS: 5.0000 mg | ORAL_TABLET | ORAL | 0 refills | Status: DC | PRN

## 2018-02-17 MED ORDER — DULOXETINE HCL 30 MG PO CPEP: 30.0000 mg | ORAL_CAPSULE | Freq: Two times a day (BID) | ORAL | 3 refills | Status: DC

## 2018-02-17 MED ORDER — COLLAGENASE 250 UNIT/GM EX OINT: TOPICAL_OINTMENT | Freq: Every day | CUTANEOUS | 0 refills | Status: DC

## 2018-02-17 MED ORDER — TEMAZEPAM 7.5 MG PO CAPS
7.5000 mg | ORAL_CAPSULE | Freq: Every evening | ORAL | Status: DC | PRN
  Administered 2018-02-18: 7.5 mg via ORAL
  Filled 2018-02-17: qty 1

## 2018-02-17 NOTE — Progress Notes (Signed)
Patient refusing to be turned. Very tearful and wants to be "left alone". Witnessed by RN and NT. Wendee Copp

## 2018-02-17 NOTE — Progress Notes (Signed)
Patient refused CPT, states she has not slept in days and cannot tolerate it. No distress noted.

## 2018-02-17 NOTE — Progress Notes (Signed)
SLP Cancellation Note  Patient Details Name: Angela Page MRN: [id number] DOB: 11-26-1970   Cancelled treatment:       Reason Eval/Treat Not Completed: Patient declined, no reason specified Pt was approached for treatment but indicated that she was full and did not want to eat anything at this time. SLP will re-attempt.    Horton Marshall 02/17/2018, 9:47 AM

## 2018-02-17 NOTE — Progress Notes (Addendum)
Received phone call from Neoma Laming at [facility] that patients night RN that is with her 6 nights a week can no longer provide services to the patient. She feels the patient is in an unsafe environment during the 8 hours she is home with her mother without medical supervision. Neoma Laming is unsure how long it will take to fill this position so that the patient will have the support she needs at home. She states it will be a least days.  CM updated pts mother, patient and MD. Current plan is for patient to discharge to [place] if they accept her. CSW updated. CM following.

## 2018-02-17 NOTE — NC FL2 (Signed)
[place] LEVEL OF CARE SCREENING TOOL     IDENTIFICATION  Patient Name: Angela Page Birthdate: 05-16-1970 Sex: female Admission Date (Current Location): 02/14/2018  [facility] and [place] Number:  Herbalist and Address:  [facility]. [facility], [place] [address], [place], [place] [address]      Provider Number: [id number]  Attending Physician Name and Address:  Edwin Dada, *  Relative Name and Phone Number:       Current Level of Care: Hospital Recommended Level of Care: [place] Prior Approval Number:    Date Approved/Denied:   PASRR Number: [id number]  Discharge Plan: SNF    Current Diagnoses: Patient Active Problem List   Diagnosis Date Noted  . Encephalopathy acute 02/14/2018  . Sepsis ([place]) 01/31/2018  . Acute on chronic respiratory failure with hypoxia ([place])   . Quadriplegia and quadriparesis ([place])   . Lobar pneumonia ([place])   . At high risk for severe sepsis   . Tracheostomy status ([place])   . Decubitus ulcer of buttock, unstageable ([place])   . Hyperglycemic hyperosmolar nonketotic coma ([place]) 12/16/2017  . Lactic acid acidosis 12/16/2017  . AKI (acute kidney injury) ([place]) 12/16/2017  . Suprapubic catheter ([place]) 12/16/2017  . [place] (hyperglycemic hyperosmolar nonketotic coma) ([place]) 12/16/2017  . Elevated lactic acid level   . Pressure injury of skin 07/17/2017  . Kidney stone 07/16/2017  . Right ureteral stone 03/10/2017  . UTI (urinary tract infection) 03/09/2017  . Ineffective airway clearance   . Sepsis secondary to UTI ([place]) 06/30/2016  . Autonomic neuropathy 06/10/2016  . Presence of permanent cardiac pacemaker 06/10/2016  . 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 Chronically on opiate therapy 06/10/2016  . Indwelling Foley catheter present 06/10/2016  . PEG (percutaneous endoscopic gastrostomy) status ([place]) 06/10/2016  . Pulmonary hypertension ([place]) 06/10/2016  .  Hypoalbuminemia 06/10/2016  . Abnormal transaminases   . Peripheral edema   . Tracheostomy dependence ([place])   . Chronic obstructive pulmonary disease ([place])   . Chronic diastolic CHF (congestive heart failure) ([place]) 01/23/2016  . Tracheostomy status ([place])   . Esophageal dysphagia   . Malnutrition of moderate degree 11/01/2015  . Tobacco abuse 10/31/2015  . Asthma 10/31/2015  . History of pneumonia 10/08/2015  . Pressure ulcer of contiguous region involving buttock and hip, stage 4 ([place]) 02/26/2015  . Hypotension 02/25/2015  . Acute metabolic encephalopathy 26/33/3545  . Leukocytosis 10/23/2014  . Hypokalemia 10/23/2014  . Decubitus ulcer of sacral region, stage 4 ([place]) 10/23/2014  . Anemia, iron deficiency 10/23/2014  . Quadriplegia, C5-C7 incomplete ([place]) 10/22/2014  . Depression   . Protein-calorie malnutrition, severe ([place]) 09/05/2014  . 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 Recurrent UTI 03/31/2014  . Pressure ulcer of coccygeal region, stage 4 ([place]) 01/06/2014  . Stage 4 skin ulcer of sacral region ([place]) 01/06/2014  . Neuropathic pain 11/30/2013  . Paraplegia following spinal cord injury ([place]) 11/30/2013  . Spinal cord injury, cervical region ([place]) 11/30/2013  . Chronic pain due to injury 11/30/2013  . S/P cervical spinal fusion 10/27/2013  . Tracheostomy care ([place]) 10/27/2013  . 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 SCI (spinal cord injury) 10/11/2013    Orientation RESPIRATION BLADDER Height & Weight     Self, Time, Situation, Place  O2, Tracheostomy(see DC summary) Incontinent, Indwelling catheter Weight: 111 lb (50.3 kg) Height:     BEHAVIORAL SYMPTOMS/MOOD NEUROLOGICAL BOWEL NUTRITION STATUS      Incontinent(gastrostomy) Diet(see DC summary)  AMBULATORY STATUS COMMUNICATION OF NEEDS Skin   Total Care Verbally PU Stage and Appropriate Care(unstageable wound on sacrum, gauze and foam dressing changed daily)       PU Stage 4 Dressing:  Daily(right and left ischial tuberosity, alginate and foam changed daily)               Personal Care Assistance Level of Assistance  Bathing, Feeding, Dressing, Total care Bathing Assistance: Maximum assistance Feeding assistance: Maximum assistance Dressing Assistance: Maximum assistance Total Care Assistance: Maximum assistance   Functional Limitations Info  Sight, Hearing, Speech Sight Info: Adequate Hearing Info: Adequate Speech Info: Adequate    SPECIAL CARE FACTORS FREQUENCY  PT (By licensed PT), OT (By licensed OT), Speech therapy     PT Frequency: 5x/wk OT Frequency: 5x/wk     Speech Therapy Frequency: 5x/wk      Contractures Contractures Info: Not present    Additional Factors Info  Code Status, Allergies, Psychotropic, Insulin Sliding Scale Code Status Info: Full Allergies Info: Erythromycin, Nitrofurantoin Monohyd Macro, Oxybutynin, Penicillins Psychotropic Info: Cymbalta 30mg  2x/day Insulin Sliding Scale Info: 0-9 units every 4 hours; Levemir 10 units at bed       Current Medications (02/17/2018):  This is the current hospital active medication list Current Facility-Administered Medications  Medication Dose Route Frequency Provider Last Rate Last Dose  . 0.9 %  sodium chloride infusion   Intravenous Continuous Jonetta Osgood, MD 10 mL/hr at 02/15/18 1449    . acetaminophen (TYLENOL) tablet 650 mg  650 mg Oral Q6H PRN Barb Merino, MD   650 mg at 02/15/18 0104   Or  . acetaminophen (TYLENOL) suppository 650 mg  650 mg Rectal Q6H PRN Barb Merino, MD      . albuterol (PROVENTIL) (2.5 MG/3ML) 0.083% nebulizer solution 2.5 mg  2.5 mg Nebulization Q2H PRN Barb Merino, MD   2.5 mg at 02/14/18 1754  . baclofen (LIORESAL) tablet 20 mg  20 mg Oral Q8H PRN Jonetta Osgood, MD   20 mg at 02/17/18 0259  . cefTAZidime (FORTAZ) 1 g in sodium chloride 0.9 % 100 mL IVPB  1 g Intravenous Q8H Ghimire, Kuber, MD 200 mL/hr at 02/17/18 1011 1 g at 02/17/18 1011   . collagenase (SANTYL) ointment   Topical Daily Ghimire, Shanker M, MD      . DULoxetine (CYMBALTA) DR capsule 30 mg  30 mg Oral BID Jonetta Osgood, MD   30 mg at 02/17/18 1009  . feeding supplement (GLUCERNA 1.2 CAL) liquid 1,000 mL  1,000 mL Per Tube Q24H Jonetta Osgood, MD   Stopped at 02/17/18 0800  . feeding supplement (PRO-STAT SUGAR FREE 64) liquid 30 mL  30 mL Per Tube BID Jonetta Osgood, MD   30 mL at 02/16/18 2141  . fluconazole (DIFLUCAN) IVPB 100 mg  100 mg Intravenous Q24H Jonetta Osgood, MD 50 mL/hr at 02/17/18 1100 100 mg at 02/17/18 1100  . gabapentin (NEURONTIN) capsule 100 mg  100 mg Oral TID Jonetta Osgood, MD   100 mg at 02/17/18 1009  . heparin injection 5,000 Units  5,000 Units Subcutaneous Q8H Barb Merino, MD   5,000 Units at 02/17/18 0555  . insulin aspart (novoLOG) injection 0-9 Units  0-9 Units Subcutaneous Q4H Barb Merino, MD   2 Units at 02/15/18 1448  . insulin detemir (LEVEMIR) injection 10 Units  10 Units Subcutaneous QHS Barb Merino, MD   10 Units at 02/16/18 2149  . ondansetron (ZOFRAN) tablet 4 mg  4 mg Oral Q6H PRN Barb Merino, MD       Or  . ondansetron (ZOFRAN) injection 4 mg  4 mg Intravenous Q6H PRN Barb Merino, MD      . oxyCODONE (Oxy IR/ROXICODONE) immediate release tablet 5 mg  5 mg Per Tube Q4H PRN Jonetta Osgood, MD   5 mg at 02/17/18 1407  . polyethylene glycol (MIRALAX / GLYCOLAX) packet 17 g  17 g Oral Daily Jonetta Osgood, MD   17 g at 02/17/18 1009  . umeclidinium bromide (INCRUSE ELLIPTA) 62.5 MCG/INH 1 puff  1 puff Inhalation Daily Jonetta Osgood, MD   1 puff at 02/17/18 9024     Discharge Medications: Please see discharge summary for a list of discharge medications.  Relevant Imaging Results:  Relevant Lab Results:   Additional Information SS#: [id number]  Geralynn Ochs, LCSW

## 2018-02-17 NOTE — Progress Notes (Signed)
PROGRESS NOTE    Angela Page  XJO:[id number] DOB: 10/20/1970 DOA: 02/14/2018 PCP: Default, Provider, MD      Brief Narrative:  Angela Page is a 48 y.o. F with hx trauma/Cspine injury, resultant quadruplegia s/p PEG, trach, and indwelling SP catheter, chronic pain on opiates, dCHF, IDDM, pacemaker for SSS, chronic anemia, chronic hypotension, and chronic sacral ulcer who presented with decreased sensorium.       Assessment & Plan:  Acute encephalopathy Appeared to be a toxic encephalopathy from new fentanyl primarily, compounded by multiple high dose sedating medications (benzodiazepines, Ativan, opiates, baclofen, tizanidine).    All narcotics and sedatives were held and she was started on IV fluids and IV antibiotics with ceftazidime.  Her mentation returned to normal, and her sedatives/psych medicines were resumed at a much lower dose.    -Recommend slow taper off of narcotics and sedatives as an outpatient   Complicated UTI:  Patient presented with confusion.  Denies suprapubic pain, fever, pain at her Foley, nausea or vomiting.  Urine culture positive for Pseudomonas and yeast.  Discussed with infectious disease, patient has had 3 days ceftazidime, would recommend stopping at this time.  -Monitor fever curve -Continue fluconazole -Stop Fortaz -Remove and replace Foley catheter today   Insulin-dependent DM-2:  Blood glucose well controlled -Continue lower dose Lantus -Continue sliding scale correction insulin   History of pacemaker implantation:  Per prior records she has a history of sick sinus syndrome  Chronic pain syndrome -Continue lower dose of gabapentin, Cymbalta, oxycodone   Hypokalemia:  Resolved  Anemia:  Hemoglobin stable relative to baseline.  Likely anemia of chronic disease   Quadriplegia with PEG tube, tracheostomy, chronic indwelling Foley, chronic dysphagia This is following a cervical injury numerous years back-bedbound-and  dependent on family and home health services for care.  Appreciate SLP evaluation-continue dysphagia 2 diet-have asked RN to discuss with nutrition to see if her PEG tube feedings can mimic her home regimen.    Pressure injury and sacrum, left ischium, right ischium -Consult W OC  Moderate protein calorie malnutrition -Continue feeding supplements -Consult nutrition  COPD No active disease -Continue inhalers   MDM and disposition: The below labs and imaging reports were reviewed and summarized above.  Medication management as above.  The patient was admitted with acute toxic encephalopathy from overmedication.  She is improved.  At present, the patient requires 24 hours nursing care, which was previously arranged at home.  Due to staffing issues at her home health agency, this is no longer available, they will pursue.  In the meantime, we will pursue SNF placement while the patient continues treatment here.        DVT prophylaxis: Heparin Code Status: FULL Family Communication: None    Consultants:   None  Procedures:   None  Antimicrobials:   Ceftazidime 3 days    Subjective: Feeling fine.  Mild pain around her PEG, bbut no suprapubic pain, no vomiting, no confusion, no fever.   Objective: Vitals:   02/17/18 1130 02/17/18 1225 02/17/18 1600 02/17/18 1643  BP: 120/64  105/72   Pulse: 80 86 84 80  Resp: 18 18 18 18   Temp: 99 F (37.2 C)  99.1 F (37.3 C)   TempSrc: Oral  Oral   SpO2: 100% 100% 99% 100%  Weight:        Intake/Output Summary (Last 24 hours) at 02/17/2018 1650 Last data filed at 02/17/2018 1300 Gross per 24 hour  Intake 1200 ml  Output 2150 ml  Net -950 ml   Filed Weights   02/14/18 0900 02/17/18 0500  Weight: 59 kg 50.3 kg    Examination: General appearance:  adult female, alert and in no acute distress.   HEENT: Anicteric, conjunctiva pink, lids and lashes normal. No nasal deformity, discharge, epistaxis.  Lips moist.     Cardiac: RRR, nl S1-S2, no murmurs appreciated.  Capillary refill is brisk.  JVP not visible.  No LE edema.  Radia  pulses 2+ and symmetric. Respiratory: Normal respiratory rate and rhythm.  CTAB without rales or wheezes. Abdomen: Abdomen soft.  no TTP. No ascites, distension, hepatosplenomegaly.   PEG and SP catheter in place MSK: Contractures in all four limbs. dIiffuse loss of muscle mass and fat Neuro: Awake and alert.  EOMI, quadruplegic. Speech fluent.    Psych: Sensorium intact and responding to questions, attention normal. Affect stable.  Judgment and insight appear normal.    Data Reviewed: I have personally reviewed following labs and imaging studies:  CBC: Recent Labs  Lab 02/14/18 0719 02/14/18 0759 02/15/18 0959 02/16/18 0454 02/17/18 0520  WBC 19.5*  --  13.0* 11.5* 15.2*  NEUTROABS 16.2*  --   --   --   --   HGB 9.8* 9.9* 8.0* 8.3* 9.4*  HCT 32.3* 29.0* 26.0* 27.6* 31.1*  MCV 85.9  --  85.2 86.8 87.6  PLT 656*  --  PLATELET CLUMPS NOTED ON SMEAR, UNABLE TO ESTIMATE 594* 001*   Basic Metabolic Panel: Recent Labs  Lab 02/14/18 0719 02/14/18 0759 02/15/18 0959 02/16/18 0454 02/17/18 0520  NA 132* 129* 136 134* 134*  K 3.3* 3.3* 3.2* 4.0 4.2  CL 83*  --  91* 94* 100  CO2 35*  --  31 26 21*  GLUCOSE 224*  --  156* 90 99  BUN 18  --  10 6 7   CREATININE 0.52  --  0.56 0.46 0.45  CALCIUM 10.1  --  9.3 8.8* 9.0  MG  --   --  2.3  --   --    GFR: Estimated Creatinine Clearance: 59.4 mL/min (by C-G formula based on SCr of 0.45 mg/dL). Liver Function Tests: Recent Labs  Lab 02/14/18 0719 02/15/18 0959  AST 18 26  ALT 17 22  ALKPHOS 145* 153*  BILITOT 0.6 0.7  PROT 8.4* 7.2  ALBUMIN 2.8* 2.4*   Recent Labs  Lab 02/14/18 0719  LIPASE 120*   Recent Labs  Lab 02/14/18 0848  AMMONIA 37*   Coagulation Profile: No results for input(s): INR, PROTIME in the last 168 hours. Cardiac Enzymes: No results for input(s): CKTOTAL, CKMB, CKMBINDEX, TROPONINI  in the last 168 hours. BNP (last 3 results) Recent Labs    04/28/17 1110  PROBNP 62.0   HbA1C: No results for input(s): HGBA1C in the last 72 hours. CBG: Recent Labs  Lab 02/16/18 2358 02/17/18 0453 02/17/18 0719 02/17/18 1122 02/17/18 1641  GLUCAP 89 88 110* 101* 110*   Lipid Profile: No results for input(s): CHOL, HDL, LDLCALC, TRIG, CHOLHDL, LDLDIRECT in the last 72 hours. Thyroid Function Tests: No results for input(s): TSH, T4TOTAL, FREET4, T3FREE, THYROIDAB in the last 72 hours. Anemia Panel: No results for input(s): VITAMINB12, FOLATE, FERRITIN, TIBC, IRON, RETICCTPCT in the last 72 hours. Urine analysis:    Component Value Date/Time   COLORURINE AMBER (A) 02/14/2018 0822   APPEARANCEUR CLOUDY (A) 02/14/2018 0822   LABSPEC 1.023 02/14/2018 0822   PHURINE 7.0 02/14/2018 0822   GLUCOSEU 50 (A) 02/14/2018 7494  GLUCOSEU NEGATIVE 04/28/2017 1121   HGBUR SMALL (A) 02/14/2018 0822   BILIRUBINUR NEGATIVE 02/14/2018 0822   KETONESUR NEGATIVE 02/14/2018 0822   PROTEINUR 100 (A) 02/14/2018 0822   UROBILINOGEN 0.2 04/28/2017 1121   NITRITE NEGATIVE 02/14/2018 0822   LEUKOCYTESUR LARGE (A) 02/14/2018 0822   Sepsis Labs: @LABRCNTIP (procalcitonin:4,lacticacidven:4)  ) Recent Results (from the past 240 hour(s))  Blood Culture (routine x 2)     Status: None (Preliminary result)   Collection Time: 02/14/18  7:30 AM  Result Value Ref Range Status   Specimen Description BLOOD LEFT HAND  Final   Special Requests   Final    BOTTLES DRAWN AEROBIC AND ANAEROBIC Blood Culture results may not be optimal due to an excessive volume of blood received in culture bottles   Culture   Final    NO GROWTH 3 DAYS Performed at [facility], [place] [address], [place], [place] [address]    Report Status PENDING  Incomplete  Blood Culture (routine x 2)     Status: None (Preliminary result)   Collection Time: 02/14/18  7:30 AM  Result Value Ref Range Status   Specimen Description  BLOOD RIGHT HAND  Final   Special Requests   Final    BOTTLES DRAWN AEROBIC AND ANAEROBIC Blood Culture adequate volume   Culture   Final    NO GROWTH 3 DAYS Performed at [facility], [place] [address] [place], [place] [address]    Report Status PENDING  Incomplete  Urine culture     Status: Abnormal   Collection Time: 02/14/18  8:06 AM  Result Value Ref Range Status   Specimen Description URINE, CATHETERIZED  Final   Special Requests   Final    NONE Performed at [facility], [place] [address] [place], [place] [address]    Culture (A)  Final    20,000 COLONIES/mL PSEUDOMONAS AERUGINOSA 50,000 COLONIES/mL YEAST    Report Status 02/16/2018 FINAL  Final   Organism ID, Bacteria PSEUDOMONAS AERUGINOSA (A)  Final      Susceptibility   Pseudomonas aeruginosa - MIC*    CEFTAZIDIME 4 SENSITIVE Sensitive     CIPROFLOXACIN >=4 RESISTANT Resistant     GENTAMICIN 8 INTERMEDIATE Intermediate     IMIPENEM 1 SENSITIVE Sensitive     PIP/TAZO 8 SENSITIVE Sensitive     CEFEPIME 4 SENSITIVE Sensitive     * 20,000 COLONIES/mL PSEUDOMONAS AERUGINOSA  Culture, respiratory (non-expectorated)     Status: None (Preliminary result)   Collection Time: 02/14/18  3:30 PM  Result Value Ref Range Status   Specimen Description TRACHEAL ASPIRATE  Final   Special Requests Normal  Final   Gram Stain NO WBC SEEN NO ORGANISMS SEEN   Final   Culture   Final    FEW STAPHYLOCOCCUS AUREUS SUSCEPTIBILITIES TO FOLLOW Performed at [facility], [place] [address] [place], [place] [address]    Report Status PENDING  Incomplete         Radiology Studies: No results found.      Scheduled Meds: . collagenase   Topical Daily  . DULoxetine  30 mg Oral BID  . feeding supplement (GLUCERNA 1.2 CAL)  1,000 mL Per Tube Q24H  . feeding supplement (PRO-STAT SUGAR FREE 64)  30 mL Per Tube BID  . gabapentin  100 mg Oral TID  . heparin  5,000 Units Subcutaneous Q8H  . insulin aspart  0-9 Units  Subcutaneous Q4H  . insulin detemir  10  Units Subcutaneous QHS  . polyethylene glycol  17 g Oral Daily  . umeclidinium bromide  1 puff Inhalation Daily   Continuous Infusions: . sodium chloride 10 mL/hr at 02/15/18 1449  . fluconazole (DIFLUCAN) IV 100 mg (02/17/18 1100)     LOS: 3 days    Time spent: 24 minutes    Edwin Dada, MD [facility] 02/17/2018, 4:50 PM     Please page through [place]:  [email or web address] Password [email or web address] If 7PM-7AM, please contact night-coverage

## 2018-02-17 NOTE — Progress Notes (Signed)
CSW alerted by [facility] that patient will need SNF placement at discharge. Per chart review patient was recently at [facility]. Last assessment completed on 02/03/18 (see below). CSW reached out to [place]; they are unsure if they will be able to accept her back, but they will review.  CSW to fax out referral to see if there are any other facilities that will admit the patient.  Laveda Abbe, [place] Clinical Social Worker [phone]      Clinical Social Work Assessment  Patient Details  Name: Angela Page MRN: [id number] Date of Birth: 01-22-1970  Date of referral:  02/03/18               Reason for consult:  Discharge Planning                           Permission sought to share information with:  Facility Sport and exercise psychologist, Family Supports Permission granted to share information::  Yes, Verbal Permission Granted             Name::     [name]             Agency::  [employer]             Relationship::  Mother             Contact Information:     Housing/Transportation Living arrangements for the past 2 months:  [place], [place] of Information:  Patient, Facility Patient Interpreter Needed:  None Criminal Activity/Legal Involvement Pertinent to Current Situation/Hospitalization:  No - Comment as needed Significant Relationships:  Dependent Children, Parents Lives with:  Minor Children, Parents Do you feel safe going back to the place where you live?  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 Patient and facility report this is a short term placement.   Patient express concerns about facility being able to manage her medical needs as she has had to be sent to the hospital twice since being in the facility.   Social Worker assessment / plan:  LCSW consulted for dc planning.   LCSW met at bedside with  patient. LCSW explained role and reason for visit. Patient was pleasant but became frustrated and tearful at the recommendation to return to SNF. Patient asked LCSW to call her mother to discuss dc plan.   LCSW and patient spoke to [name], pt mother on speaker phone. Patient expressed her concerns with returning to facility. LCSW discussed speech recommendation for SNF. [name] was able to convince patient to return to SNF. Patient states that she is being held at the facility against her will. [name] explained that she could return home when she is able to swallow without aspirating. Patient expressed understanding.   PLAN: Patient will return to SNF.   Employment status:  Disabled (Comment on whether or not currently receiving Disability) Insurance information:  Medicare PT Recommendations:  Not assessed at this time Information / Referral to community resources:     Patient/Family's Response to care:  Patient is thankful for LCSW visit.   Patient/Family's Understanding of and Emotional Response to Diagnosis, Current Treatment, and Prognosis:  Patient is not realistic about her current goals and did not seem to understand the severity of her condition as she continued to want to go home against the recommendations of hospital professionals. Patient agreeable to return, but  apprehensive. Patient reports that she is concerned about her medical needs. LCSW discussed other facilities and patient states she doesn't want to go to another facility she wants to go home. Patient is tearful, but agreeable at end of assessment.     Emotional Assessment Appearance:  Appears stated age Attitude/Demeanor/Rapport:  Crying Affect (typically observed):  Frustrated, Tearful/Crying, Hopeless Orientation:  Oriented to Self, Oriented to Place, Oriented to  Time, Oriented to Situation Alcohol / Substance use:  Not Applicable Psych involvement (Current and /or in the community):  No (Comment)  Discharge  Needs  Concerns to be addressed:  No discharge needs identified Readmission within the last 30 days:  No Current discharge risk:  None Barriers to Discharge:  No Barriers Identified   Servando Snare, LCSW 02/03/2018, 3:08 PM

## 2018-02-18 LAB — CULTURE, RESPIRATORY W GRAM STAIN
Gram Stain: NONE SEEN
Special Requests: NORMAL

## 2018-02-18 LAB — GLUCOSE, CAPILLARY
GLUCOSE-CAPILLARY: 73 mg/dL (ref 70–99)
Glucose-Capillary: 90 mg/dL (ref 70–99)
Glucose-Capillary: 127 mg/dL — ABNORMAL HIGH (ref 70–99)
Glucose-Capillary: 130 mg/dL — ABNORMAL HIGH (ref 70–99)

## 2018-02-18 LAB — CULTURE, RESPIRATORY

## 2018-02-18 MED ORDER — DIPHENHYDRAMINE HCL 50 MG PO TABS: 50.0000 mg | ORAL_TABLET | Freq: Every evening | ORAL | 0 refills | Status: DC | PRN

## 2018-02-18 MED ORDER — MELATONIN 3 MG PO CAPS: 3.0000 mg | ORAL_CAPSULE | Freq: Every evening | ORAL | 0 refills | Status: DC | PRN

## 2018-02-18 MED ORDER — INSULIN DETEMIR 100 UNIT/ML ~~LOC~~ SOLN: 10.0000 [IU] | Freq: Every day | SUBCUTANEOUS | 11 refills | Status: DC

## 2018-02-18 MED ORDER — OXYCODONE HCL 5 MG PO TABS: 5.0000 mg | ORAL_TABLET | ORAL | 0 refills | Status: DC | PRN

## 2018-02-18 MED ORDER — TEMAZEPAM 7.5 MG PO CAPS: 7.5000 mg | ORAL_CAPSULE | Freq: Every evening | ORAL | 0 refills | Status: DC | PRN

## 2018-02-18 MED ORDER — LORAZEPAM 0.5 MG PO TABS: 0.5000 mg | ORAL_TABLET | Freq: Three times a day (TID) | ORAL | 0 refills | Status: DC | PRN

## 2018-02-18 NOTE — Discharge Summary (Signed)
Physician Discharge Summary  Angela Page KDT:[id number] DOB: 1970-09-06 DOA: 02/14/2018  PCP: Default, Provider, MD  Admit date: 02/14/2018 Discharge date: 02/18/2018  Admitted From: Home  Disposition:  SNF   Recommendations for Outpatient Follow-up:  1. Follow up with PCP in 1-2 weeks after discharge from SNF 2. Please obtain BMP/CBC in one week   Home Health: None  Equipment/Devices: TBD at SNF  Discharge Condition: Fair  CODE STATUS: FULL Diet recommendation: Diabetic; dysphagia 2 diet  Brief/Interim Summary: Angela Page is a 48 y.o. F with hx trauma/Cspine injury, resultant quadruplegia s/p PEG, trach, and indwelling SP catheter, chronic pain on opiates, dCHF, IDDM, pacemaker for SSS, chronic anemia, chronic hypotension, and chronic sacral ulcer who presented with decreased sensorium.        PRINCIPAL HOSPITAL DIAGNOSIS: Acute toxic encephalopathy from polypharmacy    Discharge Diagnoses:   Acute encephalopathy Appeared to be a toxic encephalopathy from new fentanyl primarily, compounded by multiple high dose sedating medications (benzodiazepines, Ativan, opiates, baclofen, tizanidine).    All narcotics and sedatives were held and she was started on IV fluids and IV antibiotics with ceftazidime.  Her mentation returned to normal, and her sedatives/psych medicines were resumed at a much lower dose.    -Recommend slow taper off of narcotics and sedatives as an outpatient      Complicated UTI:  Patient presented with confusion.  Denied suprapubic pain, fever, pain at her Foley, nausea or vomiting.  Urine culture positive for Pseudomonas and yeast.  Discussed with infectious disease, patient had 3 days ceftazidime, would recommend stopping at this time.   She was monitored for 24 hours off antibiotics and had no fever.  WIll continue fluconazole for 11 more days until Feb 10 to complete treatment for yeast UTI.  Her Foley was  replaced.    Insulin-dependent DM-2: Blood glucose well controlled on lower dose Levemir than what she takes at home.   -Obtain blood sugars twice daily, titrate insulin up as needed -Diabetic diet  History of pacemaker implantation: Per prior records she has a history of sick sinus syndrome  Chronic pain syndrome Continue lower doses of gabapentin, Cymbalta, oxycodone.  Avoid further sedating agents.   Hypokalemia Resolved  Anemia Hemoglobin stable relative to baseline.  Likely anemia of chronic disease   Quadriplegia with PEG tube, tracheostomy, chronic indwelling Foley, chronic dysphagia This is following a cervical injury numerous years back-bedbound-and dependent on family and home health services for care.SLP evaluation done-continue dysphagia 2 diet-have asked RN to discuss with nutrition to see if her PEG tube feedings can mimic her home regimen.Staph aureus in sputum culture suspected to be contaminant/colonization, no respiratory symptoms at all, CXR clear.  Pressure injury and sacrum, left ischium, right ischium  Moderate protein calorie malnutrition  COPD No active disease          Discharge Instructions  Discharge Instructions    Diet - low sodium heart healthy   Complete by:  As directed    Discharge instructions   Complete by:  As directed    From Dr. Loleta Books: You were admitted to the hospital for confusion. This appears to have been from over medication, likely the fentanyl patch.  Stop fentanyl. The following medicines can make you confused and you must be careful with them: Baclofen Duloxetine/Cymbalta Gabapentin/Neurontin Oxycodone  Also: Alprazolam/Xanax Oxybutynin/Ditropan Tizanidine/Zanaflex Zolpidem/Ambien   While you were in the hospital, you did well with: Baclofen 20 mg three times a day Cymbalta 30 mg twice a day Gabapentin  100 mg three times a day Oxycodone 5 mg 4-6 times per day  Continue these reduced  doses.   Also, stop taking: Xanax, Ativan, oxybutynin, fentanyl, and tizanidine  Call your primary care doctor for a close follow up appointment in the next 7-10 days.    While you were here, we also noticed that your blood sugars were fine with much less insulin: For now, take Lantus or Levemir (only one of the two) and only take 10 units at night  If your blood sugars are very high, you can increase back to your previous doses of insulin.   For your bladder, we have replaced the foley We will also treat with one more week of fluconazole for yeast in your foley.   Increase activity slowly   Complete by:  As directed      Allergies as of 02/18/2018      Reactions   Erythromycin Nausea And Vomiting   Nitrofurantoin Monohyd Macro Nausea And Vomiting   Oxybutynin    Dries mouth out    Penicillins Hives   Has patient had a PCN reaction causing immediate rash, facial/tongue/throat swelling, SOB or lightheadedness with hypotension: Yes Has patient had a PCN reaction causing severe rash involving mucus membranes or skin necrosis: Yes Has patient had a PCN reaction that required hospitalization No Has patient had a PCN reaction occurring within the last 10 years: No-childhood allergy If all of the above answers are "NO", then may proceed with Cephalosporin       Medication List    STOP taking these medications   ALPRAZolam 0.25 MG tablet Commonly known as:  XANAX   arformoterol 15 MCG/2ML Nebu Commonly known as:  BROVANA   budesonide 0.5 MG/2ML nebulizer solution Commonly known as:  PULMICORT   fentaNYL 25 MCG/HR Commonly known as:  DURAGESIC   insulin glargine 100 UNIT/ML injection Commonly known as:  LANTUS   ipratropium 0.02 % nebulizer solution Commonly known as:  ATROVENT   lactulose 10 GM/15ML solution Commonly known as:  CHRONULAC   levalbuterol 1.25 MG/0.5ML nebulizer solution Commonly known as:  XOPENEX   nicotine 14 mg/24hr patch Commonly known as:   NICODERM CQ - dosed in mg/24 hours   oxybutynin 5 MG tablet Commonly known as:  DITROPAN   tiZANidine 2 MG tablet Commonly known as:  ZANAFLEX   zolpidem 10 MG tablet Commonly known as:  AMBIEN     TAKE these medications   acetaminophen 500 MG tablet Commonly known as:  TYLENOL Take 1,000 mg by mouth every 6 (six) hours as needed for mild pain, moderate pain or headache (Fever >100.4).   albuterol 108 (90 Base) MCG/ACT inhaler Commonly known as:  PROVENTIL HFA;VENTOLIN HFA Inhale 1-2 puffs into the lungs every 6 (six) hours as needed for wheezing or shortness of breath.   baclofen 20 MG tablet Commonly known as:  LIORESAL Take 1 tablet (20 mg total) by mouth every 8 (eight) hours as needed for muscle spasms. What changed:    medication strength  how much to take  how to take this  when to take this  reasons to take this  Another medication with the same name was removed. Continue taking this medication, and follow the directions you see here.   BALMEX 11.3 % Crea cream Generic drug:  zinc oxide Apply 1 application topically 3 (three) times daily as needed (skin redness/irritation).   collagenase ointment Commonly known as:  SANTYL Apply topically daily.   diphenhydrAMINE 50 MG tablet  Commonly known as:  BENADRYL Take 1 tablet (50 mg total) by mouth at bedtime as needed for itching.   DULoxetine 30 MG capsule Commonly known as:  CYMBALTA Take 1 capsule (30 mg total) by mouth 2 (two) times daily. What changed:    medication strength  how much to take   famotidine 40 MG/5ML suspension Commonly known as:  PEPCID Place 2.5 mLs (20 mg total) into feeding tube at bedtime. What changed:  how much to take   feeding supplement (GLUCERNA 1.2 CAL) Liqd Place 1,000 mLs into feeding tube daily. What changed:    how much to take  how to take this  when to take this   feeding supplement (PRO-STAT SUGAR FREE 64) Liqd Place 30 mLs into feeding tube 2 (two)  times daily.   fluconazole 100 MG tablet Commonly known as:  DIFLUCAN Take 1 tablet (100 mg total) by mouth daily for 12 days.   furosemide 40 MG tablet Commonly known as:  LASIX Place 1 tablet (40 mg total) into feeding tube daily.   gabapentin 100 MG capsule Commonly known as:  NEURONTIN Take 1 capsule (100 mg total) by mouth 3 (three) times daily. What changed:    medication strength  how much to take  additional instructions  Another medication with the same name was removed. Continue taking this medication, and follow the directions you see here.   guaiFENesin 100 MG/5ML liquid Commonly known as:  ROBITUSSIN Take 300 mg by mouth every 6 (six) hours.   Heparin Sodium (Porcine) PF 5000 UNIT/ML Soln Inject 5,000 Units as directed every 8 (eight) hours.   insulin aspart 100 UNIT/ML injection Commonly known as:  novoLOG Inject 10 Units into the skin every 4 (four) hours. For glucose 150 to 200 use 1 units, for glucose 201-250 use 2 units, for 251 to 300 use 4 units, for 301 to 350 use 6 units, for 351 to 400 use 8 units, for 401 or greater use 10 units. What changed:    how much to take  when to take this  additional instructions   insulin detemir 100 UNIT/ML injection Commonly known as:  LEVEMIR Inject 0.1 mLs (10 Units total) into the skin at bedtime. What changed:    how much to take  when to take this   LORazepam 0.5 MG tablet Commonly known as:  ATIVAN Take 1 tablet (0.5 mg total) by mouth every 8 (eight) hours as needed for anxiety. What changed:  additional instructions   Melatonin 3 MG Tabs Take 3 mg by mouth at bedtime. What changed:  Another medication with the same name was added. Make sure you understand how and when to take each.   Melatonin 3 MG Caps Take 1 capsule (3 mg total) by mouth at bedtime as needed (For sleep). What changed:  You were already taking a medication with the same name, and this prescription was added. Make sure you  understand how and when to take each.   metoCLOPramide 5 MG tablet Commonly known as:  REGLAN Take 5 mg by mouth every 6 (six) hours as needed (GERD). Via G-tube   midodrine 10 MG tablet Commonly known as:  PROAMATINE Take 10 mg by mouth 3 (three) times daily.   oxyCODONE 5 MG immediate release tablet Commonly known as:  Oxy IR/ROXICODONE Place 1 tablet (5 mg total) into feeding tube every 4 (four) hours as needed (breakthrough pain). What changed:    medication strength  Another medication with the same name  was removed. Continue taking this medication, and follow the directions you see here.   OXYGEN Inhale 3 L into the lungs continuous.   polyethylene glycol packet Commonly known as:  MIRALAX / GLYCOLAX Take 17 g by mouth daily. Hold if diarrhea.   temazepam 7.5 MG capsule Commonly known as:  RESTORIL Take 1 capsule (7.5 mg total) by mouth at bedtime as needed for sleep.   tiotropium 18 MCG inhalation capsule Commonly known as:  SPIRIVA Place 18 mcg into inhaler and inhale daily.   umeclidinium bromide 62.5 MCG/INH Aepb Commonly known as:  INCRUSE ELLIPTA Inhale 1 puff into the lungs daily.      Contact information for after-discharge care    Destination    [facility] .   Service:  Skilled Nursing Contact information: [address] [phone]             Allergies  Allergen Reactions  . Erythromycin Nausea And Vomiting  . Nitrofurantoin Monohyd Macro Nausea And Vomiting  . Oxybutynin     Dries mouth out   . Penicillins Hives    Has patient had a PCN reaction causing immediate rash, facial/tongue/throat swelling, SOB or lightheadedness with hypotension: Yes Has patient had a PCN reaction causing severe rash involving mucus membranes or skin necrosis: Yes Has patient had a PCN reaction that required hospitalization No Has patient had a PCN reaction occurring within the last 10 years:  No-childhood allergy If all of the above answers are "NO", then may proceed with Cephalosporin     Consultations:  None   Procedures/Studies: Ct Head Wo Contrast  Result Date: 02/14/2018 CLINICAL DATA:  Altered level of consciousness. EXAM: CT HEAD WITHOUT CONTRAST TECHNIQUE: Contiguous axial images were obtained from the base of the skull through the vertex without intravenous contrast. COMPARISON:  Head CT dated 12/21/2017. FINDINGS: Brain: Ventricles are stable in size and configuration. There is no mass, hemorrhage, edema or other evidence of acute parenchymal abnormality. No extra-axial hemorrhage. Vascular: No hyperdense vessel or unexpected calcification. Skull: Normal. Negative for fracture or focal lesion. Sinuses/Orbits: No acute finding. Other: None. IMPRESSION: Negative head CT. No intracranial mass, hemorrhage or edema. Electronically Signed   By: Franki Cabot M.D.   On: 02/14/2018 08:54   Dg Chest Port 1 View  Result Date: 02/14/2018 CLINICAL DATA:  Altered mental status. EXAM: PORTABLE CHEST 1 VIEW COMPARISON:  01/31/2018 FINDINGS: There is a left chest wall pacer device with leads in the right atrial appendage and right ventricle. Tracheostomy tube tip is above the carina. Normal heart size. No pleural effusion or edema. Pulmonary vascular congestion is again noted. No airspace opacities. IMPRESSION: 1. No CHF or pneumonia. Electronically Signed   By: Kerby Moors M.D.   On: 02/14/2018 07:29   Dg Chest Port 1 View  Result Date: 01/31/2018 CLINICAL DATA:  Shortness of breath. EXAM: PORTABLE CHEST 1 VIEW COMPARISON:  None. FINDINGS: 1737 hours. The cardio pericardial silhouette is enlarged. There is pulmonary vascular congestion without overt pulmonary edema. Similar appearance of the bibasilar atelectasis or infiltrate. Permanent pacemaker noted. Tracheostomy tube evident. Telemetry leads overlie the chest. IMPRESSION: Stable exam. Cardiomegaly with vascular congestion and  bibasilar atelectasis or infiltrate. Electronically Signed   By: Misty Stanley M.D.   On: 01/31/2018 18:47   Dg Chest Port 1 View  Result Date: 01/29/2018 CLINICAL DATA:  Cough with chronic left shoulder and neck pain x2 hours. Low oxygen saturations. EXAM: PORTABLE CHEST 1  VIEW COMPARISON:  12/24/2017 FINDINGS: Portable AP semi upright view of the chest. Stable cardiomegaly with mild aortic atherosclerosis and central vascular congestion is noted. Streaky parenchymal opacities at the left lung base likely represent areas of parenchymal scarring and/or atelectasis. Lesser degree of atelectasis is suggested at the right base. Left-sided pacemaker apparatus with leads in the right atrium and right ventricle appear stable. A tracheostomy tube tip terminates over the upper third of the trachea in satisfactory position. Partially included spinal fusion hardware is noted bilaterally. Left arm catheter projects up to the axilla. IMPRESSION: 1. Stable cardiomegaly with mild central vascular congestion. Bibasilar atelectasis and/or scarring. 2. Catheter from left arm approach terminates at the level of the axilla. 3. Satisfactory tracheostomy tube position. Electronically Signed   By: Ashley Royalty M.D.   On: 01/29/2018 15:35   Dg Chest Port 1 View  Result Date: 01/23/2018 CLINICAL DATA:  48 year old female with fever and chronic ventilator dependence EXAM: PORTABLE CHEST 1 VIEW COMPARISON:  Prior chest x-ray 01/13/2018 FINDINGS: Tracheostomy tube remains stable in position. The tip is midline and at the level of the clavicles. Stable position of left subclavian approach cardiac rhythm maintenance device with leads projecting over the right atrium and ventricle. Unchanged cardiomegaly. Similar appearance of patchy bibasilar and left mid lung airspace opacities. Background bronchitic changes are also similar. No definite new acute airspace disease. No pneumothorax. No acute osseous abnormality. IMPRESSION: 1. Stable  support apparatus. 2. Similar appearance of patchy airspace opacities in both lung bases and the left mid lung compared to 01/13/2018. Differential considerations include persistent multilobar pneumonia versus chronic atelectasis/scarring. 3. Electronically Signed   By: Jacqulynn Cadet M.D.   On: 01/23/2018 11:49       Subjective: Feels at baseline.  No vomiting, abdominal pain, suprapubic pain, fever, confusion, cough, sputum.  No chest pain.  Discharge Exam: Vitals:   02/18/18 1156 02/18/18 1213  BP: 131/79   Pulse: 92 81  Resp: 18 18  Temp: 98.1 F (36.7 C)   SpO2: 100% 100%   Vitals:   02/18/18 0746 02/18/18 0800 02/18/18 1156 02/18/18 1213  BP:  125/79 131/79   Pulse: 90 86 92 81  Resp: 18 18 18 18   Temp:  98.9 F (37.2 C) 98.1 F (36.7 C)   TempSrc:  Oral Oral   SpO2: 100% 100% 100% 100%  Weight:        General: Pt is alert, awake, not in acute distress, quadruplegic Cardiovascular: RRR, nl S1-S2, no murmurs appreciated.   No LE edema.   Respiratory: Normal respiratory rate and rhythm.  CTAB without rales or wheezes. Abdominal: Abdomen soft and non-tender.  No distension or HSM.   Neuro/Psych: Quadruplegic, contractures in all 4. Judgment and insight appear normal.   The results of significant diagnostics from this hospitalization (including imaging, microbiology, ancillary and laboratory) are listed below for reference.     Microbiology: Recent Results (from the past 240 hour(s))  Blood Culture (routine x 2)     Status: None (Preliminary result)   Collection Time: 02/14/18  7:30 AM  Result Value Ref Range Status   Specimen Description BLOOD LEFT HAND  Final   Special Requests   Final    BOTTLES DRAWN AEROBIC AND ANAEROBIC Blood Culture results may not be optimal due to an excessive volume of blood received in culture bottles   Culture   Final    NO GROWTH 4 DAYS Performed at [facility], [place] [address], [place], [place]  [address]    Report Status  PENDING  Incomplete  Blood Culture (routine x 2)     Status: None (Preliminary result)   Collection Time: 02/14/18  7:30 AM  Result Value Ref Range Status   Specimen Description BLOOD RIGHT HAND  Final   Special Requests   Final    BOTTLES DRAWN AEROBIC AND ANAEROBIC Blood Culture adequate volume   Culture   Final    NO GROWTH 4 DAYS Performed at [facility], [place] [address] [place], [place] [address]    Report Status PENDING  Incomplete  Urine culture     Status: Abnormal   Collection Time: 02/14/18  8:06 AM  Result Value Ref Range Status   Specimen Description URINE, CATHETERIZED  Final   Special Requests   Final    NONE Performed at [facility], [place] [address], [place], [place] [address]    Culture (A)  Final    20,000 COLONIES/mL PSEUDOMONAS AERUGINOSA 50,000 COLONIES/mL YEAST    Report Status 02/16/2018 FINAL  Final   Organism ID, Bacteria PSEUDOMONAS AERUGINOSA (A)  Final      Susceptibility   Pseudomonas aeruginosa - MIC*    CEFTAZIDIME 4 SENSITIVE Sensitive     CIPROFLOXACIN >=4 RESISTANT Resistant     GENTAMICIN 8 INTERMEDIATE Intermediate     IMIPENEM 1 SENSITIVE Sensitive     PIP/TAZO 8 SENSITIVE Sensitive     CEFEPIME 4 SENSITIVE Sensitive     * 20,000 COLONIES/mL PSEUDOMONAS AERUGINOSA  Culture, respiratory (non-expectorated)     Status: None   Collection Time: 02/14/18  3:30 PM  Result Value Ref Range Status   Specimen Description TRACHEAL ASPIRATE  Final   Special Requests   Final    Normal Performed at [facility], [place] [address] [place], [place] [address]    Gram Stain NO WBC SEEN NO ORGANISMS SEEN   Final   Culture FEW STAPHYLOCOCCUS AUREUS  Final   Report Status 02/18/2018 FINAL  Final   Organism ID, Bacteria STAPHYLOCOCCUS AUREUS  Final      Susceptibility   Staphylococcus aureus - MIC*    CIPROFLOXACIN 1 SENSITIVE Sensitive     ERYTHROMYCIN >=8 RESISTANT Resistant     GENTAMICIN <=0.5 SENSITIVE Sensitive     OXACILLIN  0.5 SENSITIVE Sensitive     TETRACYCLINE <=1 SENSITIVE Sensitive     VANCOMYCIN <=0.5 SENSITIVE Sensitive     TRIMETH/SULFA <=10 SENSITIVE Sensitive     CLINDAMYCIN RESISTANT Resistant     RIFAMPIN <=0.5 SENSITIVE Sensitive     Inducible Clindamycin POSITIVE Resistant     * FEW STAPHYLOCOCCUS AUREUS     Labs: BNP (last 3 results) Recent Labs    12/16/17 0307 02/01/18 2014  BNP 280.4* 91.6   Basic Metabolic Panel: Recent Labs  Lab 02/14/18 0719 02/14/18 0759 02/15/18 0959 02/16/18 0454 02/17/18 0520  NA 132* 129* 136 134* 134*  K 3.3* 3.3* 3.2* 4.0 4.2  CL 83*  --  91* 94* 100  CO2 35*  --  31 26 21*  GLUCOSE 224*  --  156* 90 99  BUN 18  --  10 6 7   CREATININE 0.52  --  0.56 0.46 0.45  CALCIUM 10.1  --  9.3 8.8* 9.0  MG  --   --  2.3  --   --    Liver Function Tests: Recent Labs  Lab 02/14/18 0719 02/15/18 0959  AST 18 26  ALT 17 22  ALKPHOS  145* 153*  BILITOT 0.6 0.7  PROT 8.4* 7.2  ALBUMIN 2.8* 2.4*   Recent Labs  Lab 02/14/18 0719  LIPASE 120*   Recent Labs  Lab 02/14/18 0848  AMMONIA 37*   CBC: Recent Labs  Lab 02/14/18 0719 02/14/18 0759 02/15/18 0959 02/16/18 0454 02/17/18 0520  WBC 19.5*  --  13.0* 11.5* 15.2*  NEUTROABS 16.2*  --   --   --   --   HGB 9.8* 9.9* 8.0* 8.3* 9.4*  HCT 32.3* 29.0* 26.0* 27.6* 31.1*  MCV 85.9  --  85.2 86.8 87.6  PLT 656*  --  PLATELET CLUMPS NOTED ON SMEAR, UNABLE TO ESTIMATE 594* 572*   Cardiac Enzymes: No results for input(s): CKTOTAL, CKMB, CKMBINDEX, TROPONINI in the last 168 hours. BNP: Invalid input(s): POCBNP CBG: Recent Labs  Lab 02/17/18 1955 02/18/18 0059 02/18/18 0332 02/18/18 0754 02/18/18 1152  GLUCAP 95 73 90 127* 130*   D-Dimer No results for input(s): DDIMER in the last 72 hours. Hgb A1c No results for input(s): HGBA1C in the last 72 hours. Lipid Profile No results for input(s): CHOL, HDL, LDLCALC, TRIG, CHOLHDL, LDLDIRECT in the last 72 hours. Thyroid function studies No  results for input(s): TSH, T4TOTAL, T3FREE, THYROIDAB in the last 72 hours.  Invalid input(s): FREET3 Anemia work up No results for input(s): VITAMINB12, FOLATE, FERRITIN, TIBC, IRON, RETICCTPCT in the last 72 hours. Urinalysis    Component Value Date/Time   COLORURINE AMBER (A) 02/14/2018 0822   APPEARANCEUR CLOUDY (A) 02/14/2018 0822   LABSPEC 1.023 02/14/2018 0822   PHURINE 7.0 02/14/2018 0822   GLUCOSEU 50 (A) 02/14/2018 0822   GLUCOSEU NEGATIVE 04/28/2017 1121   HGBUR SMALL (A) 02/14/2018 0822   BILIRUBINUR NEGATIVE 02/14/2018 0822   KETONESUR NEGATIVE 02/14/2018 0822   PROTEINUR 100 (A) 02/14/2018 0822   UROBILINOGEN 0.2 04/28/2017 1121   NITRITE NEGATIVE 02/14/2018 0822   LEUKOCYTESUR LARGE (A) 02/14/2018 0822   Sepsis Labs Invalid input(s): PROCALCITONIN,  WBC,  LACTICIDVEN Microbiology Recent Results (from the past 240 hour(s))  Blood Culture (routine x 2)     Status: None (Preliminary result)   Collection Time: 02/14/18  7:30 AM  Result Value Ref Range Status   Specimen Description BLOOD LEFT HAND  Final   Special Requests   Final    BOTTLES DRAWN AEROBIC AND ANAEROBIC Blood Culture results may not be optimal due to an excessive volume of blood received in culture bottles   Culture   Final    NO GROWTH 4 DAYS Performed at [facility], [place] [address], [place], [place] [address]    Report Status PENDING  Incomplete  Blood Culture (routine x 2)     Status: None (Preliminary result)   Collection Time: 02/14/18  7:30 AM  Result Value Ref Range Status   Specimen Description BLOOD RIGHT HAND  Final   Special Requests   Final    BOTTLES DRAWN AEROBIC AND ANAEROBIC Blood Culture adequate volume   Culture   Final    NO GROWTH 4 DAYS Performed at [facility], [place] [address], [place], [place] [address]    Report Status PENDING  Incomplete  Urine culture     Status: Abnormal   Collection Time: 02/14/18  8:06 AM  Result Value Ref Range Status   Specimen  Description URINE, CATHETERIZED  Final   Special Requests   Final    NONE Performed at [facility], [place] [address] [place], [place] [address]  Culture (A)  Final    20,000 COLONIES/mL PSEUDOMONAS AERUGINOSA 50,000 COLONIES/mL YEAST    Report Status 02/16/2018 FINAL  Final   Organism ID, Bacteria PSEUDOMONAS AERUGINOSA (A)  Final      Susceptibility   Pseudomonas aeruginosa - MIC*    CEFTAZIDIME 4 SENSITIVE Sensitive     CIPROFLOXACIN >=4 RESISTANT Resistant     GENTAMICIN 8 INTERMEDIATE Intermediate     IMIPENEM 1 SENSITIVE Sensitive     PIP/TAZO 8 SENSITIVE Sensitive     CEFEPIME 4 SENSITIVE Sensitive     * 20,000 COLONIES/mL PSEUDOMONAS AERUGINOSA  Culture, respiratory (non-expectorated)     Status: None   Collection Time: 02/14/18  3:30 PM  Result Value Ref Range Status   Specimen Description TRACHEAL ASPIRATE  Final   Special Requests   Final    Normal Performed at [facility], [place] [address] [place], [place] [address]    Gram Stain NO WBC SEEN NO ORGANISMS SEEN   Final   Culture FEW STAPHYLOCOCCUS AUREUS  Final   Report Status 02/18/2018 FINAL  Final   Organism ID, Bacteria STAPHYLOCOCCUS AUREUS  Final      Susceptibility   Staphylococcus aureus - MIC*    CIPROFLOXACIN 1 SENSITIVE Sensitive     ERYTHROMYCIN >=8 RESISTANT Resistant     GENTAMICIN <=0.5 SENSITIVE Sensitive     OXACILLIN 0.5 SENSITIVE Sensitive     TETRACYCLINE <=1 SENSITIVE Sensitive     VANCOMYCIN <=0.5 SENSITIVE Sensitive     TRIMETH/SULFA <=10 SENSITIVE Sensitive     CLINDAMYCIN RESISTANT Resistant     RIFAMPIN <=0.5 SENSITIVE Sensitive     Inducible Clindamycin POSITIVE Resistant     * FEW STAPHYLOCOCCUS AUREUS     Time coordinating discharge: 40 minutes The [place] controlled substances registry was reviewed for this patient prior to filling the <5 days supply controlled substances script.      SIGNED:   Edwin Dada, MD  [facility] 02/18/2018, 12:59  PM

## 2018-02-18 NOTE — Progress Notes (Signed)
Discharge to: [facility] Anticipated discharge date: 02/18/18 Transportation by: [facility]  Report #: [id number], Room 110B  CSW signing off.  Laveda Abbe LCSW [phone]

## 2018-02-18 NOTE — NC FL2 (Signed)
[place] LEVEL OF CARE SCREENING TOOL     IDENTIFICATION  Patient Name: Angela Page Birthdate: 1970/08/19 Sex: female Admission Date (Current Location): 02/14/2018  [facility] and [place] Number:  Herbalist and Address:  [facility] [place] [address] [place], [place] [address]      Provider Number: [id number]  Attending Physician Name and Address:  Edwin Dada, *  Relative Name and Phone Number:       Current Level of Care: Hospital Recommended Level of Care: [place] Prior Approval Number:    Date Approved/Denied:   PASRR Number: [id number]  Discharge Plan: SNF    Current Diagnoses: Patient Active Problem List   Diagnosis Date Noted  . Encephalopathy acute 02/14/2018  . Sepsis ([place]) 01/31/2018  . Acute on chronic respiratory failure with hypoxia ([place])   . Quadriplegia and quadriparesis ([place])   . Lobar pneumonia ([place])   . At high risk for severe sepsis   . Tracheostomy status ([place])   . Decubitus ulcer of buttock, unstageable ([place])   . Hyperglycemic hyperosmolar nonketotic coma ([place]) 12/16/2017  . Lactic acid acidosis 12/16/2017  . AKI (acute kidney injury) ([place]) 12/16/2017  . Suprapubic catheter ([place]) 12/16/2017  . Abrams (hyperglycemic hyperosmolar nonketotic coma) ([place]) 12/16/2017  . Elevated lactic acid level   . Pressure injury of skin 07/17/2017  . Kidney stone 07/16/2017  . Right ureteral stone 03/10/2017  . UTI (urinary tract infection) 03/09/2017  . Ineffective airway clearance   . Sepsis secondary to UTI ([place]) 06/30/2016  . Autonomic neuropathy 06/10/2016  . Presence of permanent cardiac pacemaker 06/10/2016  . 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 Chronically on opiate therapy 06/10/2016  . Indwelling Foley catheter present 06/10/2016  . PEG (percutaneous endoscopic gastrostomy) status ([place]) 06/10/2016  . Pulmonary hypertension ([place]) 06/10/2016  .  Hypoalbuminemia 06/10/2016  . Abnormal transaminases   . Peripheral edema   . Tracheostomy dependence ([place])   . Chronic obstructive pulmonary disease ([place])   . Chronic diastolic CHF (congestive heart failure) ([place]) 01/23/2016  . Tracheostomy status ([place])   . Esophageal dysphagia   . Malnutrition of moderate degree 11/01/2015  . Tobacco abuse 10/31/2015  . Asthma 10/31/2015  . History of pneumonia 10/08/2015  . 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) 02/26/2015  . Hypotension 02/25/2015  . Acute metabolic encephalopathy 37/62/8315  . Leukocytosis 10/23/2014  . Hypokalemia 10/23/2014  . Decubitus ulcer of sacral region, stage 4 ([place]) 10/23/2014  . Anemia, iron deficiency 10/23/2014  . Quadriplegia, C5-C7 incomplete ([place]) 10/22/2014  . Depression   . Protein-calorie malnutrition, severe ([place]) 09/05/2014  . 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 Recurrent UTI 03/31/2014  . Pressure ulcer of coccygeal region, stage 4 ([place]) 01/06/2014  . Stage 4 skin ulcer of sacral region ([place]) 01/06/2014  . Neuropathic pain 11/30/2013  . Paraplegia following spinal cord injury ([place]) 11/30/2013  . Spinal cord injury, cervical region ([place]) 11/30/2013  . Chronic pain due to injury 11/30/2013  . S/P cervical spinal fusion 10/27/2013  . Tracheostomy care ([place]) 10/27/2013  . 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 SCI (spinal cord injury) 10/11/2013    Orientation RESPIRATION BLADDER Height & Weight     Self, Time, Situation, Place  O2, Tracheostomy(see DC summary) Incontinent, Indwelling catheter Weight: 110 lb 3.7 oz (50 kg) Height:     BEHAVIORAL SYMPTOMS/MOOD NEUROLOGICAL BOWEL NUTRITION STATUS      Incontinent(gastrostomy) Feeding  tube(glucerna tube feeds at night, 40 from 12 midnight to 7 am)  AMBULATORY STATUS COMMUNICATION OF NEEDS Skin   Total Care Verbally (air mattress for wound care)       PU Stage 4 Dressing: Daily(right and  left ischial tuberosity, alginate and foam changed daily)               Personal Care Assistance Level of Assistance  Bathing, Feeding, Dressing, Total care Bathing Assistance: Maximum assistance Feeding assistance: Maximum assistance Dressing Assistance: Maximum assistance Total Care Assistance: Maximum assistance   Functional Limitations Info  Sight, Hearing, Speech Sight Info: Adequate Hearing Info: Adequate Speech Info: Adequate    SPECIAL CARE FACTORS FREQUENCY  PT (By licensed PT), OT (By licensed OT), Speech therapy     PT Frequency: 5x/wk OT Frequency: 5x/wk     Speech Therapy Frequency: 5x/wk      Contractures Contractures Info: Not present    Additional Factors Info  Code Status, Allergies, Psychotropic, Insulin Sliding Scale Code Status Info: Full Allergies Info: Erythromycin, Nitrofurantoin Monohyd Macro, Oxybutynin, Penicillins Psychotropic Info: Cymbalta 30mg  2x/day Insulin Sliding Scale Info: 0-9 units every 4 hours; Levemir 10 units at bed       Current Medications (02/18/2018):  This is the current hospital active medication list Current Facility-Administered Medications  Medication Dose Route Frequency Provider Last Rate Last Dose  . 0.9 %  sodium chloride infusion   Intravenous Continuous Jonetta Osgood, MD 10 mL/hr at 02/15/18 1449    . acetaminophen (TYLENOL) tablet 650 mg  650 mg Oral Q6H PRN Barb Merino, MD   650 mg at 02/15/18 0104   Or  . acetaminophen (TYLENOL) suppository 650 mg  650 mg Rectal Q6H PRN Barb Merino, MD      . albuterol (PROVENTIL) (2.5 MG/3ML) 0.083% nebulizer solution 2.5 mg  2.5 mg Nebulization Q2H PRN Barb Merino, MD   2.5 mg at 02/14/18 1754  . baclofen (LIORESAL) tablet 20 mg  20 mg Oral Q8H PRN Jonetta Osgood, MD   20 mg at 02/18/18 0513  . collagenase (SANTYL) ointment   Topical Daily Ghimire, Shanker M, MD      . DULoxetine (CYMBALTA) DR capsule 30 mg  30 mg Oral BID Jonetta Osgood, MD   30 mg at  02/18/18 1016  . feeding supplement (GLUCERNA 1.2 CAL) liquid 1,000 mL  1,000 mL Per Tube Q24H Jonetta Osgood, MD 40 mL/hr at 02/18/18 0033 1,000 mL at 02/18/18 0033  . feeding supplement (PRO-STAT SUGAR FREE 64) liquid 30 mL  30 mL Per Tube BID Jonetta Osgood, MD   30 mL at 02/16/18 2141  . fluconazole (DIFLUCAN) IVPB 100 mg  100 mg Intravenous Q24H Jonetta Osgood, MD 50 mL/hr at 02/18/18 1021 100 mg at 02/18/18 1021  . gabapentin (NEURONTIN) capsule 100 mg  100 mg Oral TID Jonetta Osgood, MD   100 mg at 02/18/18 1016  . heparin injection 5,000 Units  5,000 Units Subcutaneous Q8H Barb Merino, MD   5,000 Units at 02/18/18 0518  . insulin aspart (novoLOG) injection 0-9 Units  0-9 Units Subcutaneous Q4H Barb Merino, MD   2 Units at 02/15/18 1448  . insulin detemir (LEVEMIR) injection 10 Units  10 Units Subcutaneous QHS Barb Merino, MD   10 Units at 02/17/18 2141  . ondansetron (ZOFRAN) tablet 4 mg  4 mg Oral Q6H PRN Barb Merino, MD       Or  . ondansetron (ZOFRAN) injection 4 mg  4 mg Intravenous Q6H PRN Barb Merino, MD      . oxyCODONE (Oxy IR/ROXICODONE) immediate release tablet 5 mg  5 mg Per Tube Q4H PRN Jonetta Osgood, MD   5 mg at 02/18/18 1016  . polyethylene glycol (MIRALAX / GLYCOLAX) packet 17 g  17 g Oral Daily Jonetta Osgood, MD   17 g at 02/18/18 1016  . temazepam (RESTORIL) capsule 7.5 mg  7.5 mg Oral QHS PRN Edwin Dada, MD   7.5 mg at 02/18/18 0025  . umeclidinium bromide (INCRUSE ELLIPTA) 62.5 MCG/INH 1 puff  1 puff Inhalation Daily Jonetta Osgood, MD   1 puff at 02/18/18 0746     Discharge Medications: Please see discharge summary for a list of discharge medications.  Relevant Imaging Results:  Relevant Lab Results:   Additional Information SS#: [id number]  Geralynn Ochs, LCSW

## 2018-02-18 NOTE — Progress Notes (Signed)
Pt has refused most Q2HR turns this shift, pt educated on importance of turning, but continues to refuse. Angela Page

## 2018-02-18 NOTE — Progress Notes (Signed)
Report given to [facility] staff. Wendee Copp

## 2018-02-18 NOTE — Care Management Note (Signed)
Case Management Note  Patient Details  Name: Angela Page MRN: [id number] Date of Birth: 04/23/1970  Subjective/Objective:                    Action/Plan: Pt discharging to [facility] today. CM signing off.   Expected Discharge Date:  02/18/18               Expected Discharge Plan:  [place]  In-House Referral:  Clinical Social Work  Discharge planning Services  CM Consult  Post Acute Care Choice:    Choice offered to:  Patient  DME Arranged:    DME Agency:     HH Arranged:  RN [place] Agency:  [place]  Status of Service:  Completed, signed off  If discussed at [place] of Stay Meetings, dates discussed:    Additional Comments:  Pollie Friar, RN 02/18/2018, 11:26 AM

## 2018-02-18 NOTE — Progress Notes (Signed)
PMV removed for sleep, continuous  pulse ox on and alarms set and audible. Angela Page

## 2018-02-19 LAB — CULTURE, BLOOD (ROUTINE X 2)
Culture: NO GROWTH
Culture: NO GROWTH
Special Requests: ADEQUATE

## 2018-02-22 IMAGING — CT CT ANGIO CHEST
2 of 6 series · 18 of 36 positions shown · IV contrast (ISOVUE 370)
Comparison: Radiographs of same day.  CT scan November 01, 2015.

CLINICAL DATA: Shortness of breath.

EXAM:
CT ANGIOGRAPHY CHEST WITH CONTRAST
TECHNIQUE: Multidetector CT imaging of the chest was performed using the
standard protocol during bolus administration of intravenous
contrast. Multiplanar CT image reconstructions and MIPs were
obtained to evaluate the vascular anatomy.
CONTRAST:  100 mL of Isovue 370 intravenously.

[Series 6: coronal mpr · coronal · 0.51mm/px · 1 of 98 slices shown]
[im 49/98  mediastinal]
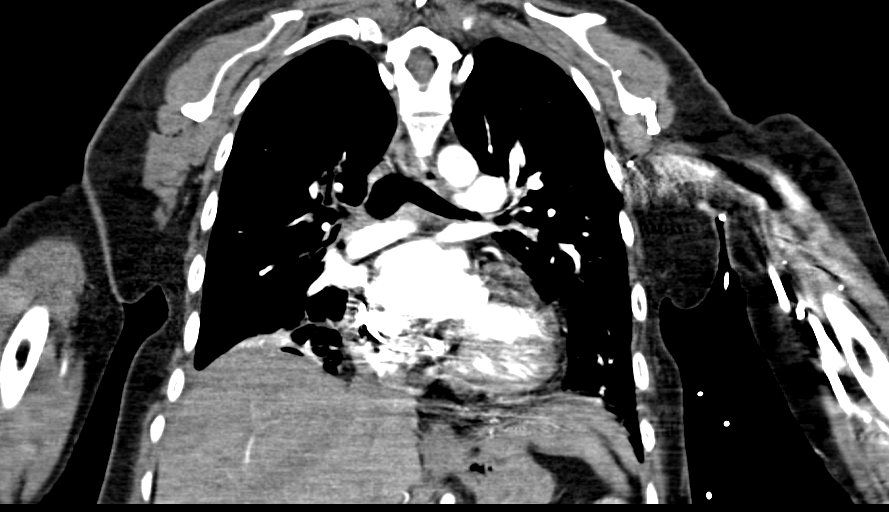

[Series 11: thins for pacs · axial · 0.62mm/px · z∈[-251,-16]mm · 17 of 263 slices shown]
[im 14/263  lung]
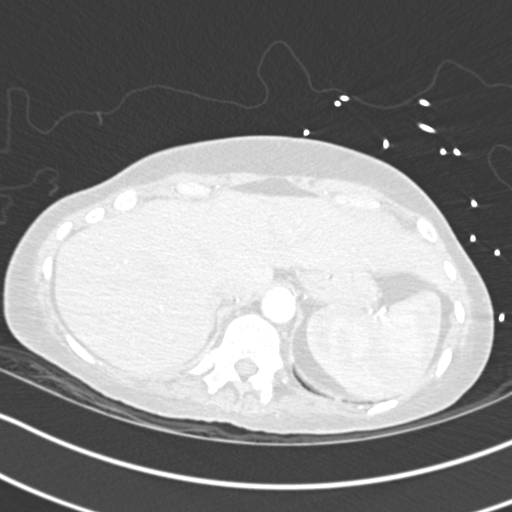
[im 27/263  mediastinal]
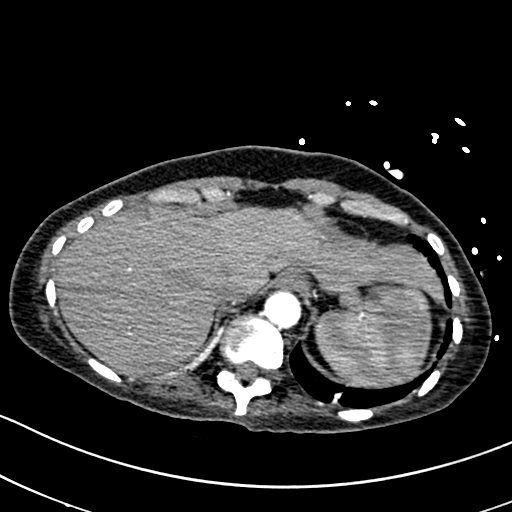
[im 40/263  lung]
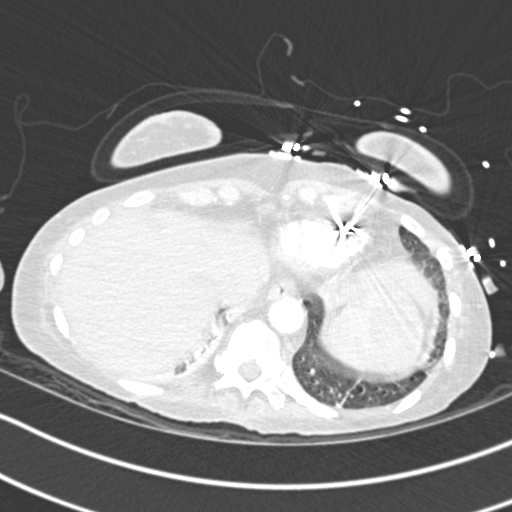
[im 53/263  mediastinal]
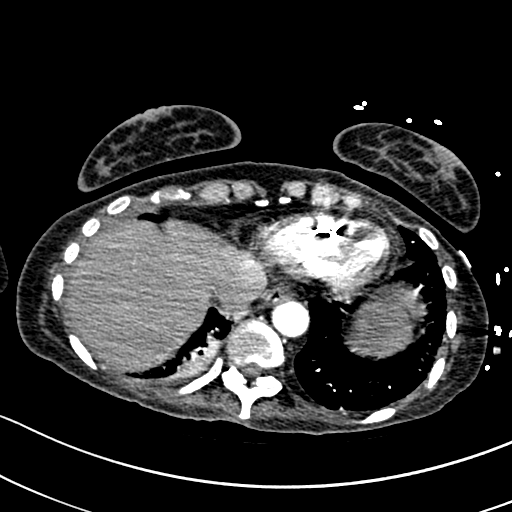
[im 79/263  lung]
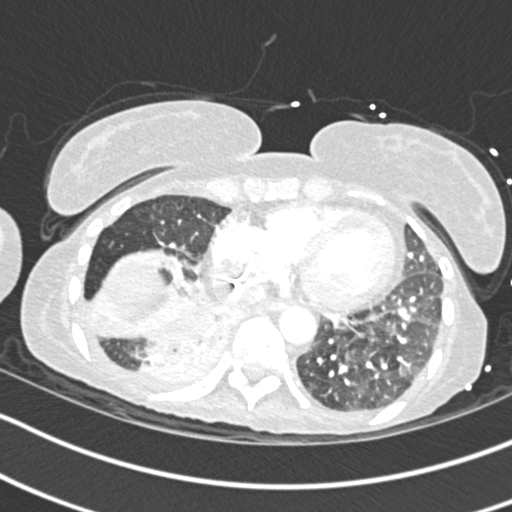
[im 92/263  mediastinal]
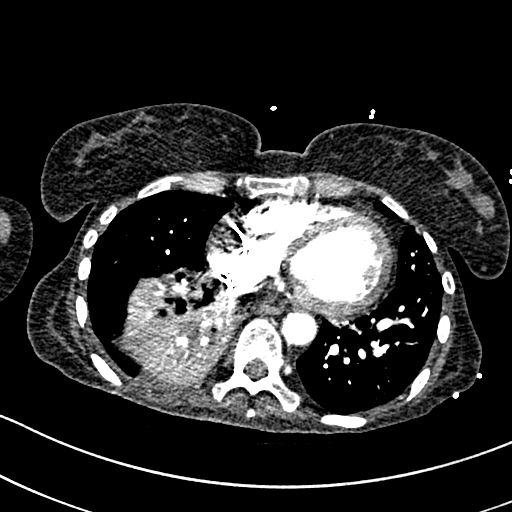
[im 105/263  lung]
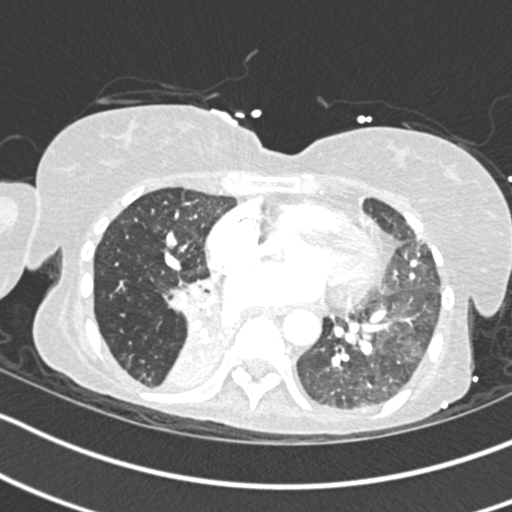
[im 118/263  mediastinal]
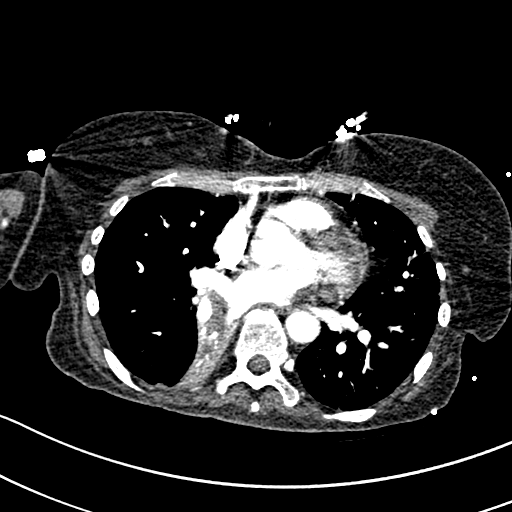
[im 132/263  lung]
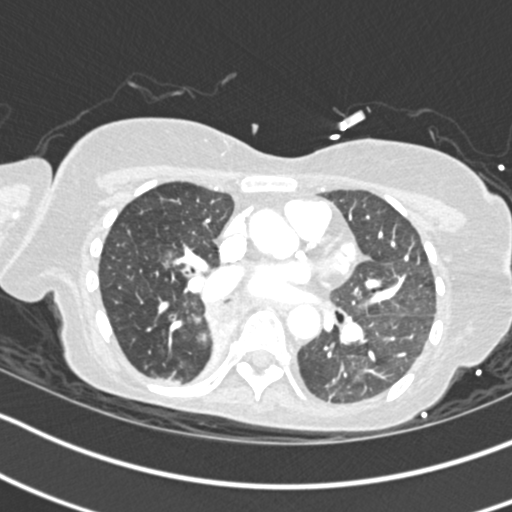
[im 145/263  mediastinal]
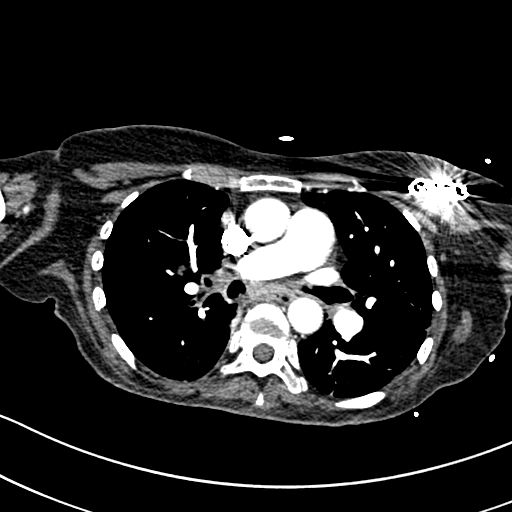
[im 158/263  lung]
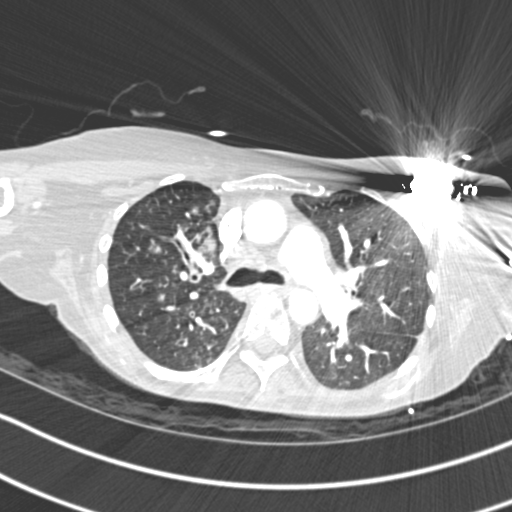
[im 171/263  mediastinal]
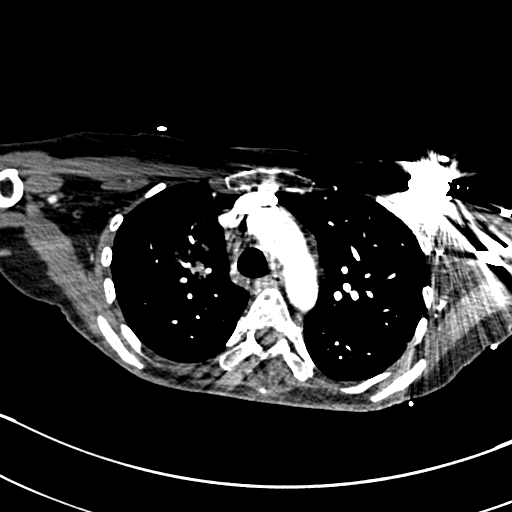
[im 184/263  lung]
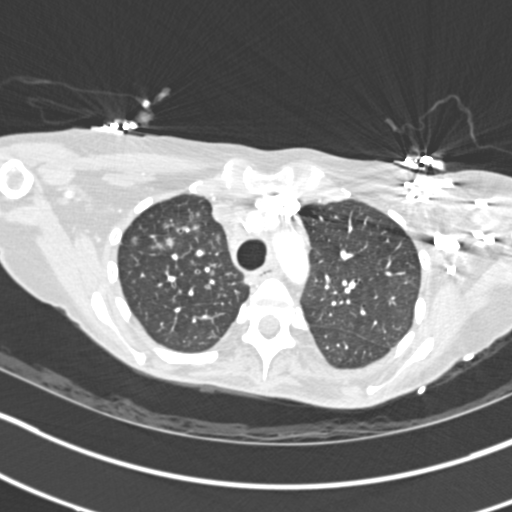
[im 210/263  mediastinal]
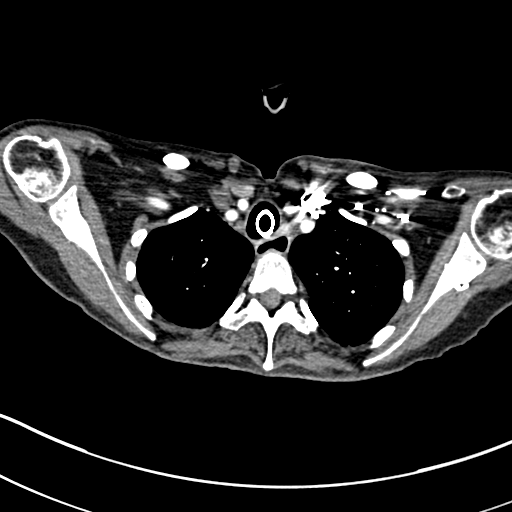
[im 223/263  lung]
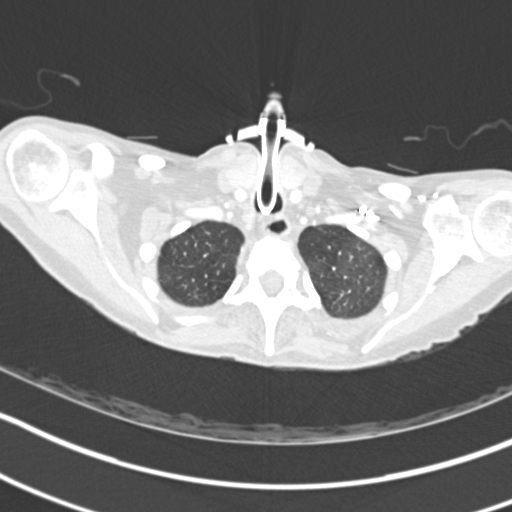
[im 236/263  mediastinal]
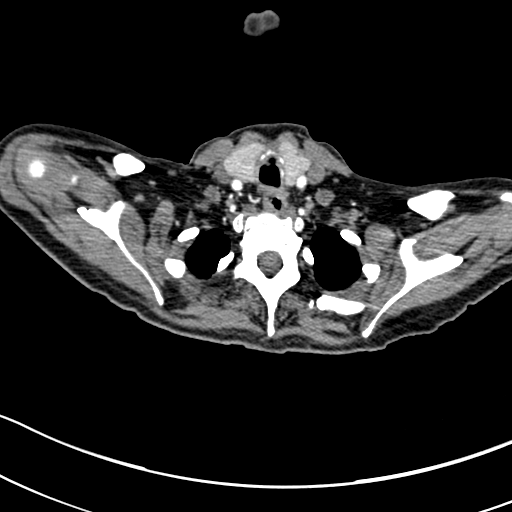
[im 249/263  lung]
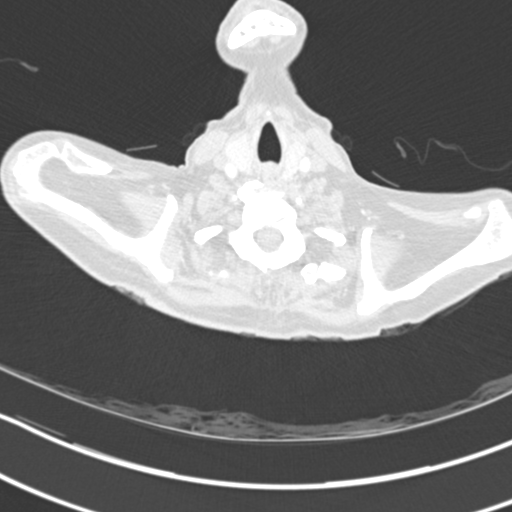

[18 of 36 positions shown; findings below may reference images not displayed]

FINDINGS: Cardiovascular: There is no evidence of thoracic aortic dissection
or aneurysm. There is no definite evidence of pulmonary embolus.

Mediastinum/Nodes: Tracheostomy tube is in grossly good position.
Left-sided pacemaker is unchanged in position. 1.2 cm precarinal
lymph node is noted. Thyroid gland is unremarkable.

Lungs/Pleura: No pneumothorax is noted. Right lower lobe atelectasis
or infiltrate is noted, which appears to be due to bronchial
occlusion or mucous plugging. Right upper lobe airspace opacity is
noted concerning for pneumonia.

Upper Abdomen: No acute abnormality.

Musculoskeletal: No chest wall abnormality. No acute or significant
osseous findings.

Review of the MIP images confirms the above findings.
IMPRESSION: No definite evidence of pulmonary embolus.

Tracheostomy tube in grossly good position.

1.2 cm precarinal lymph node is noted which is enlarged compared to
prior exam. It is uncertain if this is inflammatory or neoplastic in
origin; clinical correlation is recommended.

Right upper lobe airspace opacity is noted concerning for pneumonia.

Right lower lobe atelectasis or infiltrate is noted, which appears
to be due to occlusion of lower lobe bronchus, potentially due to
mucous plugging. Bronchoscopy is recommended for further evaluation.

## 2018-02-24 IMAGING — DX DG CHEST 1V PORT
1 series · 1 of 1 positions shown · non-contrast
Comparison: Chest x-rays dated 01/19/2016 and 01/09/2016 and
11/23/2015 and CT scan dated 01/19/2016

CLINICAL DATA: Healthcare associated pneumonia.

EXAM:
PORTABLE CHEST 1 VIEW

[chest ap]
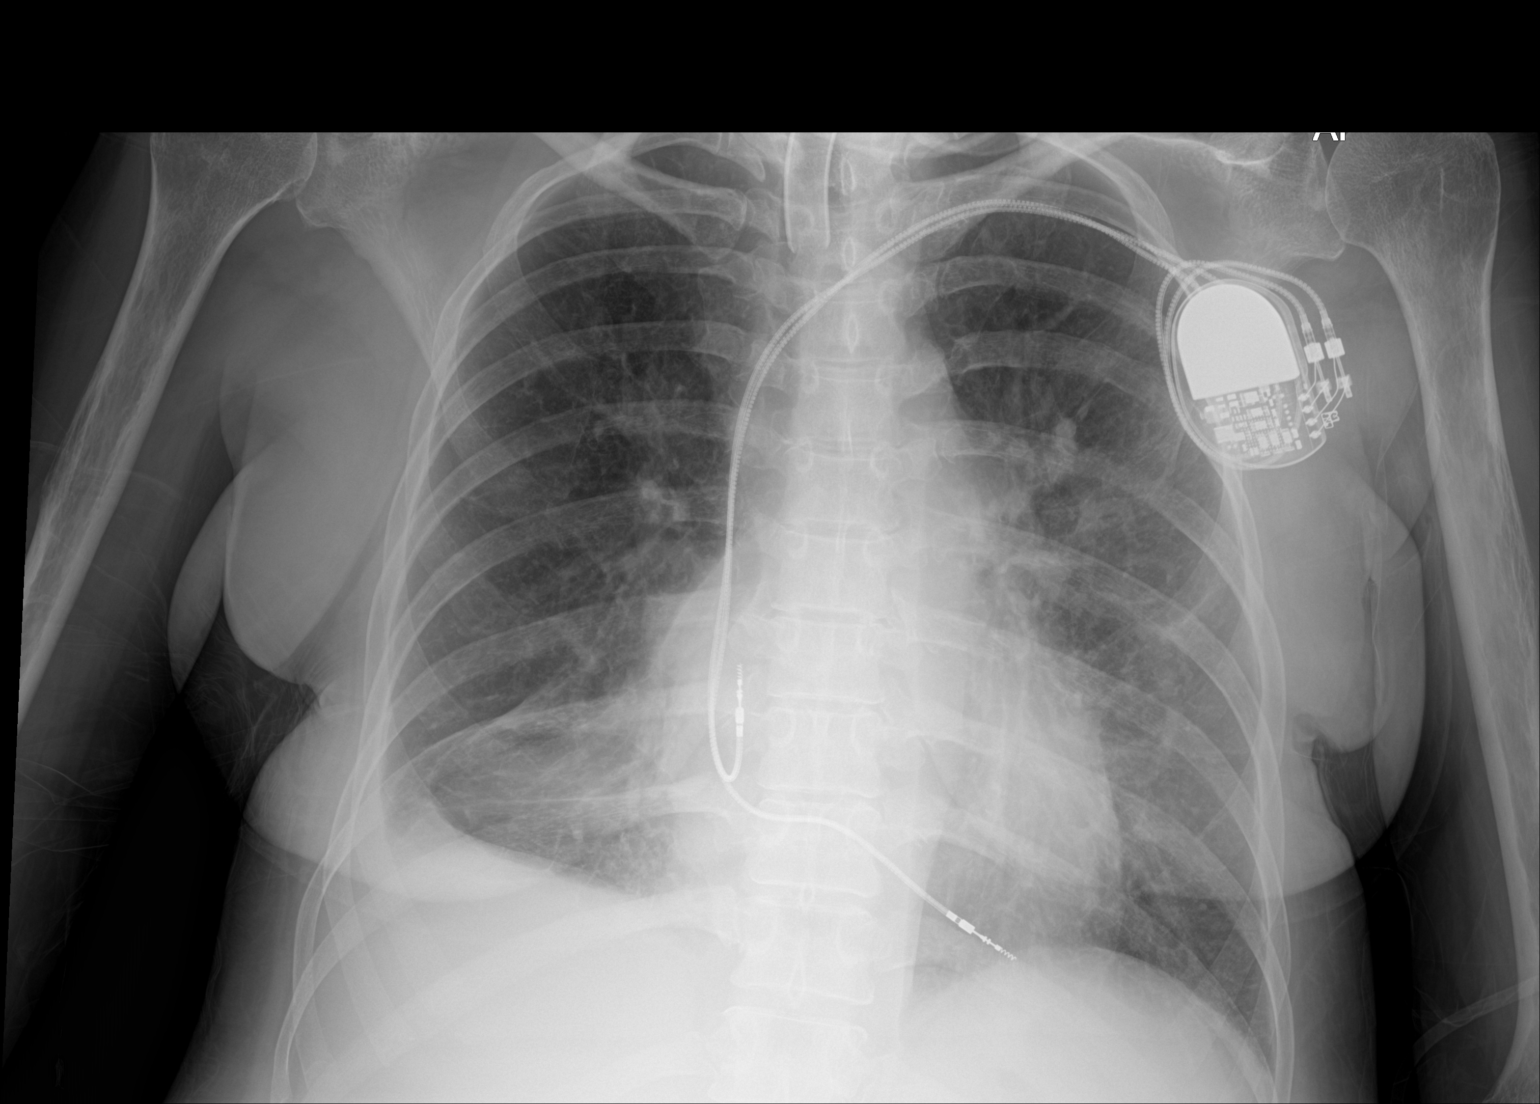

[1 of 1 positions shown; findings below may reference images not displayed]

FINDINGS: There is persistent marked atelectasis of the right lower lobe with
a tiny adjacent effusion. Heart size and pulmonary vascularity are
normal.

The patchy infiltrate in the right upper lobe seen on the CT scan is
not appreciable on the current exam.

Tracheostomy tube and pacemaker appear unchanged.
IMPRESSION: Persistent atelectasis of the majority of the right lower lobe with
a small associated pleural effusion. Right upper lobe infiltrate
appears to have cleared.

## 2018-03-02 ENCOUNTER — Telehealth: Payer: Self-pay | Admitting: Family Medicine

## 2018-03-02 NOTE — Telephone Encounter (Signed)
Called l/m to let them know we are not able to sign or give orders. We have had conversation with mom and patient several times until patient is seen in office for evaluation forms will not be completed and orders will not be given.

## 2018-03-02 NOTE — Telephone Encounter (Signed)
See note

## 2018-03-02 NOTE — Telephone Encounter (Signed)
Copied from [place] [phone]. Topic: Quick Communication - Home Health Verbal Orders >> Mar 02, 2018  3:42 PM Alanda Slim E wrote: Caller/Agency: [employer] [facility]  Callback Number: [phone] Requesting Home health plan of care / cert of [phone] Frequency: Pt is no longer home care appropriate and will be going into long term care. Please sign and send orders for the care provided. if you need them to refax orders he can. Please give him a call either way to confirm/ please advise

## 2018-03-04 NOTE — Telephone Encounter (Signed)
See note.   Copied from [place] [id number]. Topic: Quick Communication - Home Health Verbal Orders >> Mar 02, 2018  3:42 PM Alanda Slim E wrote: Caller/Agency: [employer]  Callback Number: [phone] Requesting Home health plan of care / cert of [phone] Frequency: Pt is no longer home care appropriate and will be going into long term care. Please sign and send orders for the care provided. if you need them to refax orders he can. Please give him a call either way to confirm/ please advise >> Mar 04, 2018 12:48 PM Yvette Rack wrote: Aaron Edelman with [employer] stated they still need the care plan signed so they can be reimbursed for the visits completed prior to patient being sent to long term care facility. Aaron Edelman requests call back from Dr. [name] or her assistant. Cb# [phone]

## 2018-03-04 NOTE — Telephone Encounter (Signed)
pls see message and advise 

## 2018-03-04 NOTE — Telephone Encounter (Signed)
Please advise we have sent last note before patient was sent to hospital. I informed them at that time that we will no longer fill out.

## 2018-03-04 NOTE — Telephone Encounter (Signed)
Please send this to [place] or [place]. I'm not sure what is appropriate here. We have been clear for a long time that we would not complete paperwork without seeing the patient.

## 2018-03-09 ENCOUNTER — Inpatient Hospital Stay: Payer: Medicare Other | Admitting: Family Medicine

## 2018-03-10 NOTE — Telephone Encounter (Signed)
Aaron Edelman, with [employer], calling requesting to speak with Dr. [name] or [place] regarding forms sent. I relayed message from Dr. [name] below. Aaron Edelman is requesting a call back.   Contact# [phone]

## 2018-03-10 NOTE — Telephone Encounter (Signed)
Lea, What is needed to be done concerning this patient?

## 2018-03-11 NOTE — Telephone Encounter (Signed)
See note

## 2018-03-11 NOTE — Telephone Encounter (Signed)
Aaron Edelman calling to request a call back within the next hour and for the orders for this pt to be signed before 03/13/2018 as he will not be paid for the visits and the company will be out thousands of dollars. Please advise.

## 2018-03-11 NOTE — Telephone Encounter (Signed)
Called office back spoke to Angela Page after very long conversation with him I just repeated several times what we have ware not able to fill out with out being seen by provider.

## 2018-03-11 NOTE — Telephone Encounter (Signed)
Angela Page has talked to [place] and is documenting the call.

## 2018-03-11 NOTE — Telephone Encounter (Signed)
Angela Page would like to talk to Angela Page again. He wanted to apologize to her for being short on the phone.  He stated it was just the situation, but to please forgive him.  Please call him back at [phone].

## 2018-03-12 NOTE — Telephone Encounter (Signed)
Noted  

## 2018-03-12 NOTE — Telephone Encounter (Signed)
Called back spoke with Aaron Edelman let him know that his frustration was understood and he had not offended me

## 2018-03-12 NOTE — Telephone Encounter (Signed)
Just wanted to let you know were we stand with this patient. We had discussed this yesterday

## 2018-03-16 ENCOUNTER — Other Ambulatory Visit: Payer: Self-pay | Admitting: Family Medicine

## 2018-03-27 ENCOUNTER — Other Ambulatory Visit: Payer: Self-pay | Admitting: Neurology

## 2018-03-27 DIAGNOSIS — R5383 Other fatigue: Secondary | ICD-10-CM

## 2018-04-05 ENCOUNTER — Emergency Department (HOSPITAL_COMMUNITY): Payer: Medicare Other

## 2018-04-05 ENCOUNTER — Inpatient Hospital Stay (HOSPITAL_COMMUNITY): Payer: Medicare Other

## 2018-04-05 ENCOUNTER — Inpatient Hospital Stay (HOSPITAL_COMMUNITY)
Admission: EM | Admit: 2018-04-05 | Discharge: 2018-04-11 | DRG: 871 | Disposition: A | Discharge status: Skilled Nursing Facility | Payer: Medicare Other | Source: Skilled Nursing Facility | Attending: Internal Medicine | Admitting: Internal Medicine

## 2018-04-05 ENCOUNTER — Encounter (HOSPITAL_COMMUNITY): Payer: Self-pay

## 2018-04-05 DIAGNOSIS — Z681 Body mass index (BMI) 19 or less, adult: Secondary | ICD-10-CM | POA: Diagnosis not present

## 2018-04-05 DIAGNOSIS — J9611 Chronic respiratory failure with hypoxia: Secondary | ICD-10-CM

## 2018-04-05 DIAGNOSIS — S129XXS Fracture of neck, unspecified, sequela: Secondary | ICD-10-CM

## 2018-04-05 DIAGNOSIS — Y848 Other medical procedures as the cause of abnormal reaction of the patient, or of later complication, without mention of misadventure at the time of the procedure: Secondary | ICD-10-CM | POA: Diagnosis present

## 2018-04-05 DIAGNOSIS — G8250 Quadriplegia, unspecified: Secondary | ICD-10-CM | POA: Diagnosis present

## 2018-04-05 DIAGNOSIS — J9690 Respiratory failure, unspecified, unspecified whether with hypoxia or hypercapnia: Secondary | ICD-10-CM

## 2018-04-05 DIAGNOSIS — J440 Chronic obstructive pulmonary disease with acute lower respiratory infection: Secondary | ICD-10-CM | POA: Diagnosis present

## 2018-04-05 DIAGNOSIS — L89153 Pressure ulcer of sacral region, stage 3: Secondary | ICD-10-CM | POA: Diagnosis present

## 2018-04-05 DIAGNOSIS — G9341 Metabolic encephalopathy: Secondary | ICD-10-CM | POA: Diagnosis present

## 2018-04-05 DIAGNOSIS — Z981 Arthrodesis status: Secondary | ICD-10-CM

## 2018-04-05 DIAGNOSIS — L89323 Pressure ulcer of left buttock, stage 3: Secondary | ICD-10-CM | POA: Diagnosis present

## 2018-04-05 DIAGNOSIS — G8254 Quadriplegia, C5-C7 incomplete: Secondary | ICD-10-CM | POA: Diagnosis present

## 2018-04-05 DIAGNOSIS — I493 Ventricular premature depolarization: Secondary | ICD-10-CM | POA: Diagnosis present

## 2018-04-05 DIAGNOSIS — Z9981 Dependence on supplemental oxygen: Secondary | ICD-10-CM

## 2018-04-05 DIAGNOSIS — Z87891 Personal history of nicotine dependence: Secondary | ICD-10-CM

## 2018-04-05 DIAGNOSIS — X58XXXS Exposure to other specified factors, sequela: Secondary | ICD-10-CM | POA: Diagnosis present

## 2018-04-05 DIAGNOSIS — Z801 Family history of malignant neoplasm of trachea, bronchus and lung: Secondary | ICD-10-CM

## 2018-04-05 DIAGNOSIS — Z87440 Personal history of urinary (tract) infections: Secondary | ICD-10-CM | POA: Diagnosis not present

## 2018-04-05 DIAGNOSIS — G8929 Other chronic pain: Secondary | ICD-10-CM | POA: Diagnosis present

## 2018-04-05 DIAGNOSIS — N390 Urinary tract infection, site not specified: Secondary | ICD-10-CM

## 2018-04-05 DIAGNOSIS — J449 Chronic obstructive pulmonary disease, unspecified: Secondary | ICD-10-CM | POA: Diagnosis present

## 2018-04-05 DIAGNOSIS — Z8701 Personal history of pneumonia (recurrent): Secondary | ICD-10-CM

## 2018-04-05 DIAGNOSIS — D62 Acute posthemorrhagic anemia: Secondary | ICD-10-CM | POA: Diagnosis present

## 2018-04-05 DIAGNOSIS — A419 Sepsis, unspecified organism: Secondary | ICD-10-CM

## 2018-04-05 DIAGNOSIS — A4102 Sepsis due to Methicillin resistant Staphylococcus aureus: Secondary | ICD-10-CM | POA: Diagnosis present

## 2018-04-05 DIAGNOSIS — J15212 Pneumonia due to Methicillin resistant Staphylococcus aureus: Secondary | ICD-10-CM | POA: Diagnosis present

## 2018-04-05 DIAGNOSIS — J42 Unspecified chronic bronchitis: Secondary | ICD-10-CM

## 2018-04-05 DIAGNOSIS — Z880 Allergy status to penicillin: Secondary | ICD-10-CM

## 2018-04-05 DIAGNOSIS — K219 Gastro-esophageal reflux disease without esophagitis: Secondary | ICD-10-CM | POA: Diagnosis present

## 2018-04-05 DIAGNOSIS — E440 Moderate protein-calorie malnutrition: Secondary | ICD-10-CM | POA: Diagnosis present

## 2018-04-05 DIAGNOSIS — E119 Type 2 diabetes mellitus without complications: Secondary | ICD-10-CM | POA: Diagnosis present

## 2018-04-05 DIAGNOSIS — Z794 Long term (current) use of insulin: Secondary | ICD-10-CM

## 2018-04-05 DIAGNOSIS — Z87442 Personal history of urinary calculi: Secondary | ICD-10-CM

## 2018-04-05 DIAGNOSIS — Z800 Family history of malignant neoplasm of digestive organs: Secondary | ICD-10-CM

## 2018-04-05 DIAGNOSIS — Z9359 Other cystostomy status: Secondary | ICD-10-CM

## 2018-04-05 DIAGNOSIS — I5032 Chronic diastolic (congestive) heart failure: Secondary | ICD-10-CM | POA: Diagnosis present

## 2018-04-05 DIAGNOSIS — B964 Proteus (mirabilis) (morganii) as the cause of diseases classified elsewhere: Secondary | ICD-10-CM | POA: Diagnosis present

## 2018-04-05 DIAGNOSIS — F329 Major depressive disorder, single episode, unspecified: Secondary | ICD-10-CM | POA: Diagnosis present

## 2018-04-05 DIAGNOSIS — J9621 Acute and chronic respiratory failure with hypoxia: Secondary | ICD-10-CM | POA: Diagnosis not present

## 2018-04-05 DIAGNOSIS — R0902 Hypoxemia: Secondary | ICD-10-CM

## 2018-04-05 DIAGNOSIS — G903 Multi-system degeneration of the autonomic nervous system: Secondary | ICD-10-CM | POA: Diagnosis not present

## 2018-04-05 DIAGNOSIS — Z888 Allergy status to other drugs, medicaments and biological substances status: Secondary | ICD-10-CM

## 2018-04-05 DIAGNOSIS — Y95 Nosocomial condition: Secondary | ICD-10-CM | POA: Diagnosis present

## 2018-04-05 DIAGNOSIS — Z882 Allergy status to sulfonamides status: Secondary | ICD-10-CM

## 2018-04-05 DIAGNOSIS — Z79899 Other long term (current) drug therapy: Secondary | ICD-10-CM

## 2018-04-05 DIAGNOSIS — R6521 Severe sepsis with septic shock: Secondary | ICD-10-CM | POA: Diagnosis not present

## 2018-04-05 DIAGNOSIS — R1310 Dysphagia, unspecified: Secondary | ICD-10-CM | POA: Diagnosis present

## 2018-04-05 DIAGNOSIS — L98429 Non-pressure chronic ulcer of back with unspecified severity: Secondary | ICD-10-CM | POA: Diagnosis not present

## 2018-04-05 DIAGNOSIS — J9620 Acute and chronic respiratory failure, unspecified whether with hypoxia or hypercapnia: Secondary | ICD-10-CM | POA: Diagnosis present

## 2018-04-05 DIAGNOSIS — L89313 Pressure ulcer of right buttock, stage 3: Secondary | ICD-10-CM | POA: Diagnosis present

## 2018-04-05 DIAGNOSIS — E611 Iron deficiency: Secondary | ICD-10-CM | POA: Diagnosis present

## 2018-04-05 DIAGNOSIS — F419 Anxiety disorder, unspecified: Secondary | ICD-10-CM | POA: Diagnosis present

## 2018-04-05 DIAGNOSIS — D473 Essential (hemorrhagic) thrombocythemia: Secondary | ICD-10-CM | POA: Diagnosis present

## 2018-04-05 DIAGNOSIS — I9589 Other hypotension: Secondary | ICD-10-CM | POA: Diagnosis present

## 2018-04-05 DIAGNOSIS — I495 Sick sinus syndrome: Secondary | ICD-10-CM | POA: Diagnosis present

## 2018-04-05 DIAGNOSIS — Z931 Gastrostomy status: Secondary | ICD-10-CM | POA: Diagnosis not present

## 2018-04-05 DIAGNOSIS — Z0189 Encounter for other specified special examinations: Secondary | ICD-10-CM

## 2018-04-05 DIAGNOSIS — I361 Nonrheumatic tricuspid (valve) insufficiency: Secondary | ICD-10-CM | POA: Diagnosis not present

## 2018-04-05 DIAGNOSIS — Z930 Tracheostomy status: Secondary | ICD-10-CM

## 2018-04-05 DIAGNOSIS — S14107S Unspecified injury at C7 level of cervical spinal cord, sequela: Secondary | ICD-10-CM

## 2018-04-05 DIAGNOSIS — A498 Other bacterial infections of unspecified site: Secondary | ICD-10-CM | POA: Diagnosis not present

## 2018-04-05 DIAGNOSIS — L89891 Pressure ulcer of other site, stage 1: Secondary | ICD-10-CM | POA: Diagnosis present

## 2018-04-05 DIAGNOSIS — Z950 Presence of cardiac pacemaker: Secondary | ICD-10-CM

## 2018-04-05 DIAGNOSIS — Z8249 Family history of ischemic heart disease and other diseases of the circulatory system: Secondary | ICD-10-CM

## 2018-04-05 LAB — CBC WITH DIFFERENTIAL/PLATELET
Abs Immature Granulocytes: 0.21 10*3/uL — ABNORMAL HIGH (ref 0.00–0.07)
Basophils Absolute: 0.1 10*3/uL (ref 0.0–0.1)
Basophils Relative: 0 %
EOS ABS: 0.1 10*3/uL (ref 0.0–0.5)
Eosinophils Relative: 0 %
HCT: 37.5 % (ref 36.0–46.0)
Hemoglobin: 11.1 g/dL — ABNORMAL LOW (ref 12.0–15.0)
Immature Granulocytes: 1 %
Lymphocytes Relative: 3 %
Lymphs Abs: 1.1 10*3/uL (ref 0.7–4.0)
MCH: 25.9 pg — ABNORMAL LOW (ref 26.0–34.0)
MCHC: 29.6 g/dL — ABNORMAL LOW (ref 30.0–36.0)
MCV: 87.4 fL (ref 80.0–100.0)
Monocytes Absolute: 0.7 10*3/uL (ref 0.1–1.0)
Monocytes Relative: 2 %
Neutro Abs: 29.3 10*3/uL — ABNORMAL HIGH (ref 1.7–7.7)
Neutrophils Relative %: 94 %
Platelets: 558 10*3/uL — ABNORMAL HIGH (ref 150–400)
RBC: 4.29 MIL/uL (ref 3.87–5.11)
RDW: 21.1 % — ABNORMAL HIGH (ref 11.5–15.5)
WBC: 31.4 10*3/uL — ABNORMAL HIGH (ref 4.0–10.5)
nRBC: 0.1 % (ref 0.0–0.2)

## 2018-04-05 LAB — POCT I-STAT 7, (LYTES, BLD GAS, ICA,H+H)
Acid-base deficit: 4 mmol/L — ABNORMAL HIGH (ref 0.0–2.0)
BICARBONATE: 19 mmol/L — AB (ref 20.0–28.0)
Calcium, Ion: 1.14 mmol/L — ABNORMAL LOW (ref 1.15–1.40)
HCT: 46 % (ref 36.0–46.0)
HEMOGLOBIN: 15.6 g/dL — AB (ref 12.0–15.0)
O2 Saturation: 99 %
PH ART: 7.434 (ref 7.350–7.450)
Patient temperature: 97.5
Potassium: 2.6 mmol/L — CL (ref 3.5–5.1)
Sodium: 143 mmol/L (ref 135–145)
TCO2: 20 mmol/L — ABNORMAL LOW (ref 22–32)
pCO2 arterial: 28.2 mmHg — ABNORMAL LOW (ref 32.0–48.0)
pO2, Arterial: 107 mmHg (ref 83.0–108.0)

## 2018-04-05 LAB — URINALYSIS, ROUTINE W REFLEX MICROSCOPIC
Bilirubin Urine: NEGATIVE
Glucose, UA: >=500 mg/dL — AB
Hgb urine dipstick: NEGATIVE
Ketones, ur: NEGATIVE mg/dL
Nitrite: POSITIVE — AB
PH: 9 — AB (ref 5.0–8.0)
Protein, ur: 100 mg/dL — AB
Specific Gravity, Urine: 1.015 (ref 1.005–1.030)

## 2018-04-05 LAB — COMPREHENSIVE METABOLIC PANEL
ALK PHOS: 162 U/L — AB (ref 38–126)
ALT: 18 U/L (ref 0–44)
AST: 25 U/L (ref 15–41)
Albumin: 3.2 g/dL — ABNORMAL LOW (ref 3.5–5.0)
Anion gap: 18 — ABNORMAL HIGH (ref 5–15)
BUN: 20 mg/dL (ref 6–20)
CALCIUM: 9.7 mg/dL (ref 8.9–10.3)
CO2: 22 mmol/L (ref 22–32)
CREATININE: 0.9 mg/dL (ref 0.44–1.00)
Chloride: 93 mmol/L — ABNORMAL LOW (ref 98–111)
GFR calc Af Amer: >60 mL/min (ref >60)
GFR calc non Af Amer: >60 mL/min (ref >60)
Glucose, Bld: 380 mg/dL — ABNORMAL HIGH (ref 70–99)
Potassium: 4.1 mmol/L (ref 3.5–5.1)
Sodium: 133 mmol/L — ABNORMAL LOW (ref 135–145)
Total Bilirubin: 0.5 mg/dL (ref 0.3–1.2)
Total Protein: 8.3 g/dL — ABNORMAL HIGH (ref 6.5–8.1)

## 2018-04-05 LAB — CBG MONITORING, ED: Glucose-Capillary: 216 mg/dL — ABNORMAL HIGH (ref 70–99)

## 2018-04-05 LAB — GLUCOSE, CAPILLARY
Glucose-Capillary: 109 mg/dL — ABNORMAL HIGH (ref 70–99)
Glucose-Capillary: 127 mg/dL — ABNORMAL HIGH (ref 70–99)
Glucose-Capillary: 164 mg/dL — ABNORMAL HIGH (ref 70–99)

## 2018-04-05 LAB — MRSA PCR SCREENING: MRSA by PCR: POSITIVE — AB

## 2018-04-05 LAB — LACTIC ACID, PLASMA
Lactic Acid, Venous: 3.6 mmol/L (ref 0.5–1.9)
Lactic Acid, Venous: 1.4 mmol/L (ref 0.5–1.9)
Lactic Acid, Venous: 4.4 mmol/L (ref 0.5–1.9)
Lactic Acid, Venous: 3.5 mmol/L (ref 0.5–1.9)
Lactic Acid, Venous: 3.2 mmol/L (ref 0.5–1.9)

## 2018-04-05 LAB — INFLUENZA PANEL BY PCR (TYPE A & B)
Influenza A By PCR: NEGATIVE
Influenza B By PCR: NEGATIVE

## 2018-04-05 MED ORDER — BACLOFEN 20 MG PO TABS
20.0000 mg | ORAL_TABLET | Freq: Three times a day (TID) | ORAL | Status: DC
  Administered 2018-04-06: 20 mg
  Filled 2018-04-05 (×2): qty 1

## 2018-04-05 MED ORDER — SENNOSIDES-DOCUSATE SODIUM 8.6-50 MG PO TABS: 1.0000 | ORAL_TABLET | Freq: Every evening | ORAL | Status: DC | PRN

## 2018-04-05 MED ORDER — SODIUM CHLORIDE 0.9 % IV BOLUS (SEPSIS)
500.0000 mL | Freq: Once | INTRAVENOUS | Status: AC
  Administered 2018-04-05: 500 mL via INTRAVENOUS

## 2018-04-05 MED ORDER — LACTATED RINGERS IV SOLN
INTRAVENOUS | Status: DC
  Administered 2018-04-05 – 2018-04-06 (×3): via INTRAVENOUS

## 2018-04-05 MED ORDER — FENTANYL CITRATE (PF) 100 MCG/2ML IJ SOLN
100.0000 ug | INTRAMUSCULAR | Status: DC | PRN
  Administered 2018-04-05 – 2018-04-08 (×12): 100 ug via INTRAVENOUS
  Filled 2018-04-05 (×9): qty 2

## 2018-04-05 MED ORDER — DULOXETINE HCL 30 MG PO CPEP: 30.0000 mg | ORAL_CAPSULE | Freq: Two times a day (BID) | ORAL | Status: DC

## 2018-04-05 MED ORDER — OXYCODONE HCL 5 MG PO TABS: 5.0000 mg | ORAL_TABLET | ORAL | Status: DC | PRN

## 2018-04-05 MED ORDER — FLUCONAZOLE 200 MG PO TABS
200.0000 mg | ORAL_TABLET | Freq: Every day | ORAL | Status: DC
  Administered 2018-04-05 – 2018-04-07 (×3): 200 mg
  Filled 2018-04-05 (×3): qty 1

## 2018-04-05 MED ORDER — GABAPENTIN 100 MG PO CAPS: 100.0000 mg | ORAL_CAPSULE | Freq: Three times a day (TID) | ORAL | Status: DC

## 2018-04-05 MED ORDER — METOCLOPRAMIDE HCL 5 MG PO TABS: 5.0000 mg | ORAL_TABLET | Freq: Three times a day (TID) | ORAL | Status: DC

## 2018-04-05 MED ORDER — IBUPROFEN 100 MG/5ML PO SUSP
400.0000 mg | Freq: Once | ORAL | Status: AC
  Administered 2018-04-05: 400 mg
  Filled 2018-04-05: qty 20

## 2018-04-05 MED ORDER — TEMAZEPAM 7.5 MG PO CAPS: 7.5000 mg | ORAL_CAPSULE | Freq: Every evening | ORAL | Status: DC | PRN

## 2018-04-05 MED ORDER — LORAZEPAM 0.5 MG PO TABS: 0.5000 mg | ORAL_TABLET | Freq: Three times a day (TID) | ORAL | Status: DC | PRN

## 2018-04-05 MED ORDER — IBUPROFEN 400 MG PO TABS: 400.0000 mg | ORAL_TABLET | Freq: Once | ORAL | Status: DC

## 2018-04-05 MED ORDER — FAMOTIDINE 20 MG PO TABS
20.0000 mg | ORAL_TABLET | Freq: Every day | ORAL | Status: DC
  Administered 2018-04-05: 20 mg
  Filled 2018-04-05 (×2): qty 1

## 2018-04-05 MED ORDER — MIDODRINE HCL 5 MG PO TABS
10.0000 mg | ORAL_TABLET | Freq: Three times a day (TID) | ORAL | Status: DC
  Filled 2018-04-05 (×2): qty 2

## 2018-04-05 MED ORDER — SODIUM CHLORIDE 0.9 % IV SOLN
2.0000 g | INTRAVENOUS | Status: AC
  Administered 2018-04-05: 2 g via INTRAVENOUS
  Filled 2018-04-05: qty 2

## 2018-04-05 MED ORDER — LEVOFLOXACIN IN D5W 750 MG/150ML IV SOLN: 750.0000 mg | Freq: Once | INTRAVENOUS | Status: DC

## 2018-04-05 MED ORDER — PRO-STAT SUGAR FREE PO LIQD
30.0000 mL | Freq: Every day | ORAL | Status: DC
  Administered 2018-04-05 – 2018-04-11 (×6): 30 mL
  Filled 2018-04-05 (×6): qty 30

## 2018-04-05 MED ORDER — INSULIN GLARGINE 100 UNIT/ML ~~LOC~~ SOLN
5.0000 [IU] | Freq: Every day | SUBCUTANEOUS | Status: DC
  Administered 2018-04-05 – 2018-04-10 (×6): 5 [IU] via SUBCUTANEOUS
  Filled 2018-04-05 (×7): qty 0.05

## 2018-04-05 MED ORDER — MIDAZOLAM HCL 2 MG/2ML IJ SOLN
2.0000 mg | INTRAMUSCULAR | Status: DC | PRN
  Administered 2018-04-05: 2 mg via INTRAVENOUS
  Filled 2018-04-05: qty 2

## 2018-04-05 MED ORDER — ACETAMINOPHEN 650 MG RE SUPP: 650.0000 mg | Freq: Four times a day (QID) | RECTAL | Status: DC | PRN

## 2018-04-05 MED ORDER — ORAL CARE MOUTH RINSE
15.0000 mL | OROMUCOSAL | Status: DC
  Administered 2018-04-05 – 2018-04-09 (×35): 15 mL via OROMUCOSAL

## 2018-04-05 MED ORDER — ONDANSETRON HCL 4 MG/2ML IJ SOLN: 4.0000 mg | Freq: Four times a day (QID) | INTRAMUSCULAR | Status: DC | PRN

## 2018-04-05 MED ORDER — CHLORHEXIDINE GLUCONATE 0.12% ORAL RINSE (MEDLINE KIT)
15.0000 mL | Freq: Two times a day (BID) | OROMUCOSAL | Status: DC
  Administered 2018-04-05 – 2018-04-08 (×7): 15 mL via OROMUCOSAL

## 2018-04-05 MED ORDER — MIDODRINE HCL 5 MG PO TABS
10.0000 mg | ORAL_TABLET | Freq: Three times a day (TID) | ORAL | Status: DC
  Administered 2018-04-05 – 2018-04-11 (×20): 10 mg
  Filled 2018-04-05 (×21): qty 2

## 2018-04-05 MED ORDER — ALBUTEROL SULFATE (2.5 MG/3ML) 0.083% IN NEBU: 2.5000 mg | INHALATION_SOLUTION | RESPIRATORY_TRACT | Status: DC | PRN

## 2018-04-05 MED ORDER — CALCIUM GLUCONATE-NACL 1-0.675 GM/50ML-% IV SOLN
1.0000 g | Freq: Once | INTRAVENOUS | Status: AC
  Administered 2018-04-05: 1000 mg via INTRAVENOUS
  Filled 2018-04-05: qty 50

## 2018-04-05 MED ORDER — SODIUM CHLORIDE 0.9 % IV BOLUS (SEPSIS)
1000.0000 mL | Freq: Once | INTRAVENOUS | Status: AC
  Administered 2018-04-05: 1000 mL via INTRAVENOUS

## 2018-04-05 MED ORDER — HEPARIN SODIUM (PORCINE) 5000 UNIT/ML IJ SOLN
5000.0000 [IU] | Freq: Three times a day (TID) | INTRAMUSCULAR | Status: DC
  Administered 2018-04-05 – 2018-04-06 (×3): 5000 [IU] via SUBCUTANEOUS
  Filled 2018-04-05 (×3): qty 1

## 2018-04-05 MED ORDER — ACETAMINOPHEN 160 MG/5ML PO SOLN
500.0000 mg | Freq: Once | ORAL | Status: AC
  Administered 2018-04-05: 500 mg
  Filled 2018-04-05: qty 20.3

## 2018-04-05 MED ORDER — VANCOMYCIN HCL IN DEXTROSE 1-5 GM/200ML-% IV SOLN: 1000.0000 mg | Freq: Once | INTRAVENOUS | Status: DC

## 2018-04-05 MED ORDER — POTASSIUM CHLORIDE 20 MEQ/15ML (10%) PO SOLN
80.0000 meq | Freq: Once | ORAL | Status: AC
  Administered 2018-04-05: 80 meq
  Filled 2018-04-05: qty 60

## 2018-04-05 MED ORDER — MELATONIN 3 MG PO TABS: 3.0000 mg | ORAL_TABLET | Freq: Every day | ORAL | Status: DC

## 2018-04-05 MED ORDER — OSMOLITE 1.2 CAL PO LIQD
1000.0000 mL | ORAL | Status: DC
  Administered 2018-04-05 – 2018-04-07 (×3): 1000 mL
  Filled 2018-04-05 (×4): qty 1000

## 2018-04-05 MED ORDER — MIDAZOLAM HCL 2 MG/2ML IJ SOLN: 2.0000 mg | INTRAMUSCULAR | Status: DC | PRN

## 2018-04-05 MED ORDER — SODIUM CHLORIDE 0.9 % IV SOLN
1.0000 g | INTRAVENOUS | Status: AC
  Administered 2018-04-06 – 2018-04-09 (×4): 1 g via INTRAVENOUS
  Filled 2018-04-05 (×4): qty 1

## 2018-04-05 MED ORDER — GUAIFENESIN 100 MG/5ML PO SOLN: 300.0000 mg | Freq: Four times a day (QID) | ORAL | Status: DC | PRN

## 2018-04-05 MED ORDER — SODIUM CHLORIDE 0.9 % IV SOLN
INTRAVENOUS | Status: DC
  Administered 2018-04-05 (×2): via INTRAVENOUS

## 2018-04-05 MED ORDER — INSULIN ASPART 100 UNIT/ML ~~LOC~~ SOLN
0.0000 [IU] | SUBCUTANEOUS | Status: DC
  Administered 2018-04-05: 3 [IU] via SUBCUTANEOUS
  Administered 2018-04-05 (×2): 2 [IU] via SUBCUTANEOUS
  Administered 2018-04-06 (×2): 1 [IU] via SUBCUTANEOUS
  Administered 2018-04-07 (×2): 2 [IU] via SUBCUTANEOUS
  Administered 2018-04-07: 1 [IU] via SUBCUTANEOUS
  Administered 2018-04-08: 3 [IU] via SUBCUTANEOUS
  Administered 2018-04-08: 2 [IU] via SUBCUTANEOUS
  Administered 2018-04-08: 3 [IU] via SUBCUTANEOUS

## 2018-04-05 MED ORDER — SODIUM CHLORIDE 0.9 % IV SOLN
500.0000 mg | Freq: Every day | INTRAVENOUS | Status: DC
  Administered 2018-04-05: 500 mg via INTRAVENOUS
  Filled 2018-04-05: qty 500

## 2018-04-05 MED ORDER — FAMOTIDINE 40 MG/5ML PO SUSR
20.0000 mg | Freq: Once | ORAL | Status: DC
  Filled 2018-04-05 (×2): qty 2.5

## 2018-04-05 MED ORDER — SODIUM CHLORIDE 0.9 % IV BOLUS (SEPSIS)
250.0000 mL | Freq: Once | INTRAVENOUS | Status: AC
  Administered 2018-04-05: 250 mL via INTRAVENOUS

## 2018-04-05 MED ORDER — IPRATROPIUM BROMIDE 0.02 % IN SOLN
0.5000 mg | Freq: Four times a day (QID) | RESPIRATORY_TRACT | Status: DC
  Administered 2018-04-05 – 2018-04-06 (×4): 0.5 mg via RESPIRATORY_TRACT
  Filled 2018-04-05 (×4): qty 2.5

## 2018-04-05 MED ORDER — UMECLIDINIUM BROMIDE 62.5 MCG/INH IN AEPB
1.0000 | INHALATION_SPRAY | Freq: Every day | RESPIRATORY_TRACT | Status: DC
  Filled 2018-04-05: qty 7

## 2018-04-05 MED ORDER — TIOTROPIUM BROMIDE MONOHYDRATE 18 MCG IN CAPS: 18.0000 ug | ORAL_CAPSULE | Freq: Every day | RESPIRATORY_TRACT | Status: DC

## 2018-04-05 MED ORDER — POLYETHYLENE GLYCOL 3350 17 G PO PACK
17.0000 g | PACK | Freq: Every day | ORAL | Status: DC
  Administered 2018-04-07 – 2018-04-11 (×4): 17 g via ORAL
  Filled 2018-04-05 (×4): qty 1

## 2018-04-05 MED ORDER — SODIUM CHLORIDE 0.9% FLUSH
3.0000 mL | Freq: Two times a day (BID) | INTRAVENOUS | Status: DC
  Administered 2018-04-05 – 2018-04-06 (×2): 3 mL via INTRAVENOUS
  Administered 2018-04-06: 10 mL via INTRAVENOUS
  Administered 2018-04-07 – 2018-04-11 (×5): 3 mL via INTRAVENOUS

## 2018-04-05 MED ORDER — VANCOMYCIN HCL 10 G IV SOLR
1250.0000 mg | Freq: Once | INTRAVENOUS | Status: AC
  Administered 2018-04-05: 1250 mg via INTRAVENOUS
  Filled 2018-04-05: qty 1250

## 2018-04-05 MED ORDER — METRONIDAZOLE IN NACL 5-0.79 MG/ML-% IV SOLN: 500.0000 mg | Freq: Three times a day (TID) | INTRAVENOUS | Status: DC

## 2018-04-05 MED ORDER — ACETAMINOPHEN 325 MG PO TABS
650.0000 mg | ORAL_TABLET | Freq: Four times a day (QID) | ORAL | Status: DC | PRN
  Administered 2018-04-05 – 2018-04-08 (×3): 650 mg via ORAL
  Filled 2018-04-05 (×3): qty 2

## 2018-04-05 MED ORDER — FAMOTIDINE 20 MG PO TABS: 20.0000 mg | ORAL_TABLET | Freq: Once | ORAL | Status: DC

## 2018-04-05 MED ORDER — VANCOMYCIN HCL IN DEXTROSE 1-5 GM/200ML-% IV SOLN
1000.0000 mg | INTRAVENOUS | Status: AC
  Administered 2018-04-06 – 2018-04-09 (×4): 1000 mg via INTRAVENOUS
  Filled 2018-04-05 (×4): qty 200

## 2018-04-05 MED ORDER — ONDANSETRON HCL 4 MG PO TABS: 4.0000 mg | ORAL_TABLET | Freq: Four times a day (QID) | ORAL | Status: DC | PRN

## 2018-04-05 MED ORDER — SODIUM CHLORIDE 0.9 % IV BOLUS
1000.0000 mL | Freq: Once | INTRAVENOUS | Status: AC
  Administered 2018-04-05: 1000 mL via INTRAVENOUS

## 2018-04-05 MED ORDER — FENTANYL CITRATE (PF) 100 MCG/2ML IJ SOLN
100.0000 ug | INTRAMUSCULAR | Status: AC | PRN
  Administered 2018-04-06 (×3): 100 ug via INTRAVENOUS
  Filled 2018-04-05 (×5): qty 2

## 2018-04-05 MED ORDER — BACLOFEN 20 MG PO TABS
20.0000 mg | ORAL_TABLET | Freq: Three times a day (TID) | ORAL | Status: DC | PRN
  Filled 2018-04-05: qty 1

## 2018-04-05 NOTE — Progress Notes (Signed)
NRB and PMV removed at this time, pt placed on 98% ATC at 10L. SpO2 currently 98%.

## 2018-04-05 NOTE — Progress Notes (Signed)
Report called to Aaron Edelman RN on [place], patient transported to [facility] by SWOT nurses.

## 2018-04-05 NOTE — H&P (Signed)
History and Physical    Angela Page MGQ:[id number] DOB: 11/26/1970 DOA: 04/05/2018  PCP: Default, Provider, MD   Patient coming from: SNF   Chief Complaint: Fever   HPI: Angela Page is a 48 y.o. female with medical history significant for traumatic cervical spine fracture with resulting quadriplegia status post trach, PEG, suprapubic catheter, insulin-dependent diabetes mellitus, chronic pain, sick sinus syndrome with pacer, chronic normocytic anemia, COPD with chronic hypoxic respiratory failure, chronic hypotension, and decubitus ulcers, now presenting to the emergency department from her SNF for evaluation of fever. The patient is unable to contribute to the history due to her clinical condition and there is no answer at her SNF x2.   ED Course: Upon arrival to the ED, patient is found to be febrile to 40.5 C, saturating 92% on nonrebreather, tachypneic to the 40s, tachycardic in 150s, and with blood pressure 94/75.  EKG features sinus tachycardia with rate 152, PVCs, and LVH with repolarization abnormality.  Chest x-ray is notable for mild right basilar opacity, likely atelectasis.  Chemistry panel features a glucose of 380.  CBC is notable for leukocytosis to 31,400 and a chronic thrombocytosis with platelets 558,000.  Urinalysis features large leukocytes and positive nitrites.  Lactic acid is elevated to 3.6.  Blood and urine cultures were collected, 30 cc/kg NS bolus was given, and the patient was treated with vancomycin, cefepime, and azithromycin for suspected UTI and/or pneumonia.  Tachycardia and tachypnea improved, blood pressure remained stable, and the patient will be observed in the progressive unit for ongoing evaluation and management.  Review of Systems:  Unable to complete ROS secondary to the patient's clinical condition.  Past Medical History:  Diagnosis Date  . Acute on chronic respiratory failure with hypoxia ([place])   . Anasarca 06/10/2016  . Asthma   . At  high risk for severe sepsis   . Cocaine use 10/08/2013  . Decubitus ulcer of buttock, unstageable ([place])   . Depression   . Diabetes mellitus without complication ([place])   . GERD (gastroesophageal reflux disease)   . HCAP (healthcare-associated pneumonia) 06/09/2016  . Hepatitis    hx of hepatits frm mono   . Kidney stone   . Lobar pneumonia ([place])   . Overdose of opiate or related narcotic ([place]) 09/02/2014  . Pacemaker   . Pleurisy   . Polysubstance dependence including opioid type drug, episodic abuse ([place]) 10/27/2013  . Protein calorie malnutrition ([place]) 10/31/2015  . Quadriparesis ([place])   . Quadriplegia and quadriparesis ([place])   . Quadriplegia, C5-C7 incomplete ([place]) 10/08/2013  . Stage IV pressure ulcer of sacral region ([place]) 10/31/2015  . Tracheostomy status [facility])     Past Surgical History:  Procedure Laterality Date  . APPENDECTOMY    . BACK SURGERY  10/08/2013   fusion of spine, cervical region  . CARDIAC SURGERY    . CYSTOSCOPY W/ URETERAL STENT PLACEMENT Right 03/09/2017   Procedure: CYSTOSCOPY [name] STENT PLACEMENT;  Surgeon: Irine Seal, MD;  Location: [facility];  Service: Urology;  Laterality: Right;  . CYSTOSCOPY W/ URETERAL STENT REMOVAL Right 07/16/2017   Procedure: CYSTOSCOPY WITH STENT REMOVAL;  Surgeon: Franchot Gallo, MD;  Location: [facility];  Service: Urology;  Laterality: Right;  . CYSTOSCOPY/URETEROSCOPY/HOLMIUM LASER/STENT PLACEMENT Right 07/16/2017   Procedure: CYSTOSCOPY/URETEROSCOPY/HOLMIUM LASER/STENT PLACEMENT/ALSO STONE EXTRACTION;  Surgeon: Franchot Gallo, MD;  Location: [facility];  Service: Urology;  Laterality: Right;  . GASTROSTOMY  11/23/2015  . I&D EXTREMITY  10/28/2011   Procedure: IRRIGATION AND DEBRIDEMENT EXTREMITY;  Surgeon: Tennis Must, MD;  Location: [place];  Service: Orthopedics;  Laterality: Right;  Irrigation and debridement right middle finger  . INSERTION OF SUPRAPUBIC CATHETER N/A 07/28/2016   Procedure: INSERTION OF  SUPRAPUBIC CATHETER;  Surgeon: Franchot Gallo, MD;  Location: [facility];  Service: Urology;  Laterality: N/A;  . IR GENERIC HISTORICAL  11/23/2015   IR GASTROSTOMY TUBE MOD SED 11/23/2015 [facility]  . IR PATIENT EVAL TECH 0-60 MINS  12/18/2017  . IR REPLACE G-TUBE SIMPLE WO FLUORO  12/19/2017  . LITHOTRIPSY  08/2017  . TRACHEOSTOMY       reports that she has been smoking cigarettes. She has a 25.00 pack-year smoking history. She has never used smokeless tobacco. She reports that she does not drink alcohol or use drugs.  Allergies  Allergen Reactions  . Erythromycin Nausea And Vomiting  . Nitrofurantoin Monohyd Macro Nausea And Vomiting  . Oxybutynin     Dries mouth out   . Penicillins Hives    Has patient had a PCN reaction causing immediate rash, facial/tongue/throat swelling, SOB or lightheadedness with hypotension: Yes Has patient had a PCN reaction causing severe rash involving mucus membranes or skin necrosis: Yes Has patient had a PCN reaction that required hospitalization No Has patient had a PCN reaction occurring within the last 10 years: No-childhood allergy If all of the above answers are "NO", then may proceed with Cephalosporin  Tolerated cephalosporins in past    Family History  Problem Relation Age of Onset  . Hypertension Maternal Uncle   . Kidney failure Maternal Uncle   . Colon cancer Mother   . Lung cancer Father   . Hypertension Brother   . Kidney failure Maternal Aunt   . Kidney failure Maternal Uncle   . Colon cancer Maternal Grandfather   . Esophageal cancer Neg Hx      Prior to Admission medications   Medication Sig Start Date End Date Taking? Authorizing Provider  acetaminophen (TYLENOL) 500 MG tablet Take 1,000 mg by mouth every 6 (six) hours as needed for mild pain, moderate pain or headache (Fever >100.4).   Yes [provider]  albuterol (PROVENTIL HFA;VENTOLIN HFA) 108 (90 Base) MCG/ACT inhaler Inhale 1-2 puffs into the lungs every  6 (six) hours as needed for wheezing or shortness of breath.   Yes [provider]  Amino Acids-Protein Hydrolys (FEEDING SUPPLEMENT, PRO-STAT SUGAR FREE 64,) LIQD Place 30 mLs into feeding tube 2 (two) times daily. Patient taking differently: Take 30 mLs by mouth 2 (two) times daily.  02/17/18  Yes Danford, Suann Larry, MD  baclofen (LIORESAL) 20 MG tablet Take 1 tablet (20 mg total) by mouth every 8 (eight) hours as needed for muscle spasms. 02/17/18  Yes Danford, Suann Larry, MD  DULoxetine (CYMBALTA) 30 MG capsule Take 1 capsule (30 mg total) by mouth 2 (two) times daily. 02/17/18  Yes Danford, Suann Larry, MD  famotidine (PEPCID) 40 MG/5ML suspension Place 2.5 mLs (20 mg total) into feeding tube at bedtime. Patient taking differently: Take 20 mg by mouth at bedtime.  12/31/17  Yes Arrien, Jimmy Picket, MD  furosemide (LASIX) 40 MG tablet Place 1 tablet (40 mg total) into feeding tube daily. Patient taking differently: Take 40 mg by mouth daily.  01/01/18 04/05/18 Yes Arrien, Jimmy Picket, MD  gabapentin (NEURONTIN) 100 MG capsule Take 1 capsule (100 mg total) by mouth 3 (three) times daily. Patient taking differently: Take 300 mg by mouth 3 (three) times daily.  02/17/18  Yes Danford, Suann Larry, MD  guaiFENesin (ROBITUSSIN) 100 MG/5ML liquid Take 300 mg by mouth every 6 (six) hours as needed for cough.    Yes [provider]  Heparin Sodium, Porcine, PF 5000 UNIT/ML SOLN Inject 5,000 Units as directed every 8 (eight) hours.    Yes [provider]  insulin aspart (NOVOLOG) 100 UNIT/ML injection Inject 10 Units into the skin every 4 (four) hours. For glucose 150 to 200 use 1 units, for glucose 201-250 use 2 units, for 251 to 300 use 4 units, for 301 to 350 use 6 units, for 351 to 400 use 8 units, for 401 or greater use 10 units. Patient taking differently: Inject 0-10 Units into the skin See admin instructions. Check FSBS every 6 hours. Give insulin according to  sliding scale: If BS is 0-150 = 0u, 151-200 = 2u, 201-250 = 4u, 251-300 = 6u, 301-350 = 8u, 351-400 = 10u. >400 call MD 12/31/17  Yes Arrien, Jimmy Picket, MD  Insulin Glargine (BASAGLAR KWIKPEN) 100 UNIT/ML SOPN Inject 10 Units into the skin at bedtime.   Yes [provider]  ipratropium-albuterol (DUONEB) 0.5-2.5 (3) MG/3ML SOLN Take 3 mLs by nebulization every 6 (six) hours as needed (SOB).   Yes [provider]  LORazepam (ATIVAN) 0.5 MG tablet Take 1 tablet (0.5 mg total) by mouth every 8 (eight) hours as needed for anxiety. Patient taking differently: Take 0.5 mg by mouth every 8 (eight) hours.  02/18/18  Yes Danford, Suann Larry, MD  Melatonin 3 MG TABS Take 3 mg by mouth at bedtime.    Yes [provider]  metoCLOPramide (REGLAN) 5 MG tablet Take 5 mg by mouth 4 (four) times daily. Via G-tube    Yes [provider]  midodrine (PROAMATINE) 10 MG tablet Take 10 mg by mouth 3 (three) times daily.   Yes [provider]  Nutritional Supplements (FEEDING SUPPLEMENT, GLUCERNA 1.2 CAL,) LIQD Place 1,000 mLs into feeding tube daily. 02/18/18  Yes Danford, Suann Larry, MD  ondansetron (ZOFRAN) 4 MG tablet Take 4 mg by mouth every 6 (six) hours as needed for nausea or vomiting.   Yes [provider]  oxyCODONE (OXY IR/ROXICODONE) 5 MG immediate release tablet Place 1 tablet (5 mg total) into feeding tube every 4 (four) hours as needed (breakthrough pain). Patient taking differently: Take 5 mg by mouth every 4 (four) hours as needed (breakthrough pain).  02/18/18  Yes Danford, Suann Larry, MD  OXYGEN Inhale 3 L into the lungs continuous.   Yes [provider]  polyethylene glycol (MIRALAX / GLYCOLAX) packet Take 17 g by mouth daily. Hold if diarrhea. 01/01/18  Yes Arrien, Jimmy Picket, MD  senna-docusate (SENOKOT-S) 8.6-50 MG tablet Take 2 tablets by mouth at bedtime as needed for mild constipation.   Yes [provider]   sodium chloride (OCEAN) 0.65 % SOLN nasal spray Place 2 sprays into both nostrils daily as needed for congestion.   Yes [provider]  SODIUM CHLORIDE PO Take 500 mg by mouth 2 (two) times daily.   Yes [provider]  temazepam (RESTORIL) 7.5 MG capsule Take 1 capsule (7.5 mg total) by mouth at bedtime as needed for sleep. Patient taking differently: Take 7.5 mg by mouth at bedtime.  02/18/18  Yes Danford, Suann Larry, MD  tiotropium (SPIRIVA) 18 MCG inhalation capsule Place 18 mcg into inhaler and inhale daily.   Yes [provider]  collagenase (SANTYL) ointment Apply topically daily. Patient not taking: Reported  on 04/05/2018 02/18/18   Edwin Dada, MD  diphenhydrAMINE (BENADRYL) 50 MG tablet Take 1 tablet (50 mg total) by mouth at bedtime as needed for itching. Patient not taking: Reported on 04/05/2018 02/18/18   Edwin Dada, MD  insulin detemir (LEVEMIR) 100 UNIT/ML injection Inject 0.1 mLs (10 Units total) into the skin at bedtime. Patient not taking: Reported on 04/05/2018 02/18/18   Edwin Dada, MD  Melatonin 3 MG CAPS Take 1 capsule (3 mg total) by mouth at bedtime as needed (For sleep). Patient not taking: Reported on 04/05/2018 02/18/18   Edwin Dada, MD  umeclidinium bromide (INCRUSE ELLIPTA) 62.5 MCG/INH AEPB Inhale 1 puff into the lungs daily. Patient not taking: Reported on 04/05/2018 02/18/18   Edwin Dada, MD    Physical Exam: Vitals:   04/05/18 0215 04/05/18 0230 04/05/18 0245 04/05/18 0315  BP: 118/83 107/75 94/75 94/72   Pulse: (!) 136 (!) 128 (!) 130 (!) 125  Resp: (!) 39 (!) 37 (!) 35 (!) 31  Temp:      TempSrc:      SpO2: 99% 98% 100% 99%    Constitutional: lethargic, tachypneic, no diaphoresis  Eyes: PERTLA, lids and conjunctivae normal ENMT: Mucous membranes are moist. Posterior pharynx clear of any exudate or lesions.   Neck: supple, no masses, trach present  Respiratory: Tachypneic.  Diminished breath sounds bilaterally. No rhonchi, wheeze, or crackles. No accessory muscle use.  Cardiovascular: Rate ~120 and regular. No extremity edema.   Abdomen: No distension, no tenderness, soft. Bowel sounds active.  Musculoskeletal: no clubbing / cyanosis. Contractures involving LE's bilaterallyl.    Skin: Pressure ulcer involving sacral region. Warm, dry, well-perfused. Neurologic: PERRL, no gross facial asymmetry. Moving upper extremities, no movement of bilateral LE.  Psychiatric: Lethargic. Makes eye contact and tracks. No verbal communication.    Labs on Admission: I have personally reviewed following labs and imaging studies  CBC: Recent Labs  Lab 04/05/18 0145  WBC 31.4*  NEUTROABS 29.3*  HGB 11.1*  HCT 37.5  MCV 87.4  PLT 824*   Basic Metabolic Panel: Recent Labs  Lab 04/05/18 0145  NA 133*  K 4.1  CL 93*  CO2 22  GLUCOSE 380*  BUN 20  CREATININE 0.90  CALCIUM 9.7   GFR: CrCl cannot be calculated (Unknown ideal weight.). Liver Function Tests: Recent Labs  Lab 04/05/18 0145  AST 25  ALT 18  ALKPHOS 162*  BILITOT 0.5  PROT 8.3*  ALBUMIN 3.2*   No results for input(s): LIPASE, AMYLASE in the last 168 hours. No results for input(s): AMMONIA in the last 168 hours. Coagulation Profile: No results for input(s): INR, PROTIME in the last 168 hours. Cardiac Enzymes: No results for input(s): CKTOTAL, CKMB, CKMBINDEX, TROPONINI in the last 168 hours. BNP (last 3 results) Recent Labs    04/28/17 1110  PROBNP 62.0   HbA1C: No results for input(s): HGBA1C in the last 72 hours. CBG: No results for input(s): GLUCAP in the last 168 hours. Lipid Profile: No results for input(s): CHOL, HDL, LDLCALC, TRIG, CHOLHDL, LDLDIRECT in the last 72 hours. Thyroid Function Tests: No results for input(s): TSH, T4TOTAL, FREET4, T3FREE, THYROIDAB in the last 72 hours. Anemia Panel: No results for input(s): VITAMINB12, FOLATE, FERRITIN, TIBC, IRON, RETICCTPCT in the  last 72 hours. Urine analysis:    Component Value Date/Time   COLORURINE YELLOW 04/05/2018 0218   APPEARANCEUR CLOUDY (A) 04/05/2018 0218   LABSPEC 1.015 04/05/2018 0218   PHURINE 9.0 (H) 04/05/2018  0218   GLUCOSEU >=500 (A) 04/05/2018 0218   GLUCOSEU NEGATIVE 04/28/2017 1121   HGBUR NEGATIVE 04/05/2018 0218   BILIRUBINUR NEGATIVE 04/05/2018 0218   KETONESUR NEGATIVE 04/05/2018 0218   PROTEINUR 100 (A) 04/05/2018 0218   UROBILINOGEN 0.2 04/28/2017 1121   NITRITE POSITIVE (A) 04/05/2018 0218   LEUKOCYTESUR LARGE (A) 04/05/2018 0218   Sepsis Labs: @LABRCNTIP (procalcitonin:4,lacticidven:4) )No results found for this or any previous visit (from the past 240 hour(s)).   Radiological Exams on Admission: Dg Chest Portable 1 View  Result Date: 04/05/2018 CLINICAL DATA:  Cough EXAM: PORTABLE CHEST 1 VIEW COMPARISON:  02/14/2018 FINDINGS: Unchanged position of left chest wall pacemaker leads. There is mild opacity at the right lung base. No pleural effusion or pneumothorax. Tracheostomy tube tip is just above the level of the clavicular heads. IMPRESSION: Mild right basilar opacity, likely atelectasis. Electronically Signed   By: Ulyses Jarred M.D.   On: 04/05/2018 01:54    EKG: Independently reviewed. Sinus tachycardia (rate 152), PVC's, LVH with repolarization abnormality.   Assessment/Plan   1. Sepsis secondary to UTI    - Presents from SNF with fever, found to be febrile with tachycardia, tachypnea, marked leukocytosis, and elevated lactate  - CXR with mild opacity in at right base; no rhonchi on exam; she was tachypneic with increased WOB on arrival, but this has settled down significantly and PNA felt to be unlikely  - UA is compatible with infection; she has hx of pseudomonal UTIs  - Influenza PCR negative  - Abd exam benign  - Extensive pressure ulcer potential source  - Blood and urine cultures were collected in ED, 30 cc/kg NS bolus was given, and she was treated with empiric  vancomycin, cefepime, and azithromycin  - Continue empiric antibiotics, trend lactate, follow cultures and clinical course   2. COPD; tracheostomy status; chronic hypoxic respiratory failure  - She requires 3 Lpm supplemental O2 at baseline  - Saturating 100% on 10 Lpm in ED, will attempt to wean  - There is no wheezing noted on admission  - Continue supplemental O2, Spiriva and as-needed albuterol   3. Insulin-dependent DM  - A1c was 9.4% in January  - Continue Lantus and Novolog   4. Spastic quadriplegia; chronic pain  - Continue Cymbalta, as-needed baclofen, APAP, and oxycodone    5. Anxiety  - Continue Cymbalta and as-needed Ativan    6. Pressure injury  - Present on admission  - Consult wound care RN    7. Chronic hypotension  - SBP 90's in ED with 30 cc/kg NS bolus still infusing  - Continue midodrine    DVT prophylaxis: sq heparin  Code Status: Full  Family Communication: Discussed with patient  Consults called: None Admission status: Observation     Vianne Bulls, MD [facility] Pager [phone]  If 7PM-7AM, please contact night-coverage [email or web address] Password [facility]  04/05/2018, 3:47 AM

## 2018-04-05 NOTE — Progress Notes (Signed)
Sputum sample obtained and sent down to main lab without complications.  

## 2018-04-05 NOTE — Progress Notes (Signed)
Initial Nutrition Assessment  DOCUMENTATION CODES:   Non-severe (moderate) malnutrition in context of chronic illness, Underweight  INTERVENTION:   Tube feeding via G-tube: - Osmolite 1.2 @ 45 ml/hr (1080 ml/day) - Pro-stat 30 ml daily - Free water per MD  Tube feeding regimen provides 1396 kcal, 75 grams of protein, and 886 ml of H2O (100% of needs).  NUTRITION DIAGNOSIS:   Moderate Malnutrition related to chronic illness (COPD with chronic hypoxic respiratory failure) as evidenced by mild fat depletion, moderate fat depletion, mild muscle depletion, moderate muscle depletion, percent weight loss (25.3% weight loss in 9 months).  GOAL:   Patient will meet greater than or equal to 90% of their needs  MONITOR:   Vent status, Labs, Weight trends, I & O's, TF tolerance, Skin  REASON FOR ASSESSMENT:   Consult Assessment of nutrition requirement/status, Enteral/tube feeding initiation and management  ASSESSMENT:   48 year old female who presented to the ED on 3/16 from SNF with a fever. PMH of traumatic cervical spine fracture with resulting quadriplegia s/p trach, PEG, suprapubic catheter, insulin-dependent diabetes mellitus, chronic pain, sick sinus syndrome with pacer, chronic normocytic anemia, COPD with chronic hypoxic respiratory failure, chronic hypotension, and decubitus ulcers. Pt admitted with sepsis secondary to UTI.  Cuffless trach removed this AM and replaced with cuffed trach and pt placed on ventilator.  Reviewed RD notes from previous admissions. Noted pt has experienced abdominal pain and distention in the past with tube feedings. Per most recent RD note on 02/16/18, pt's home tube feeding regimen consists of nocturnal feedings via PEG using Glucerna 1.2 formula at 40 ml/hr x 7 hours and Pro-stat 30 ml BID which provides 536 kcal, 47 grams of protein, and 227 ml free water. At the time of this note, pt was on a Dysphagia 1 diet. Pt is currently NPO.  Discussed pt  with RN. Paged MD who agreed with previous consult to start TF.  Attempted to speak with pt at bedside. Pt nodded and smiled when RD explained plan to start TF via G-tube. Pt unable to provide much additional information regarding TF PTA, PO intake, or weight history. Unsure whether pt was eating by mouth prior to this admission.  Reviewed weight history in chart. Pt has been steadily losing weight over the last 13 months. Per chart, pt with 15.5 kg weight loss since 07/16/17. This is a 25.3% weight loss in 9 months which is severe and significant for timeframe.  Patient is on ventilator support via trach. Per chart review, pt with trach but is not normally vent dependent. MV: 13.7 L/min Temp (24hrs), Avg:100.1 F (37.8 C), Min:97.4 F (36.3 C), Max:104.9 F (40.5 C) Temperature trending down.  Medications reviewed and include: Pepcid, SSI q 4 hours, Lantus 5 units daily, Miralax, calcium gluconate 1 gram once, IV antibiotics, LR @ 75 ml/hr  Labs reviewed: potassium 2.6 (L) CBG's: 164, 216  UOP: 500 ml x 24 hours I/O's: +3.0 L since admit  NUTRITION - FOCUSED PHYSICAL EXAM:    Most Recent Value  Orbital Region  Moderate depletion  Upper Arm Region  Moderate depletion  Thoracic and Lumbar Region  Mild depletion  Buccal Region  No depletion  Temple Region  Mild depletion  Clavicle Bone Region  Moderate depletion  Clavicle and Acromion Bone Region  Moderate depletion  Scapular Bone Region  Mild depletion  Dorsal Hand  Unable to assess  Patellar Region  Moderate depletion  Anterior Thigh Region  Severe depletion  Posterior Calf Region  Severe  depletion  Edema (RD Assessment)  None  Hair  Reviewed  Eyes  Reviewed  Mouth  Reviewed  Skin  Reviewed  Nails  Reviewed     Suspect severe wasting in BLE related more to lack of use and therefore will not utilize this in malnutrition diagnosis.  Diet Order:   Diet Order            Diet NPO time specified  Diet effective now               EDUCATION NEEDS:   Not appropriate for education at this time  Skin:  Skin Assessment: (per [place] RN note)  Skin Integrity Issues: Stage I: medial L knee, medial R knee Stage III: L ischium, R ischium, sacrum, proximal sacrum  Last BM:  04/05/18 large type 4  Height:   Ht Readings from Last 1 Encounters:  04/05/18 5\' 2"  (1.575 m)    Weight:   Wt Readings from Last 1 Encounters:  04/05/18 45.7 kg   02/03/18: 53.1 kg 12/30/17: 54.2 kg 12/23/17: 59.6 kg 12/18/17: 56.7 kg  Ideal Body Weight:  45 kg (adjusted for quadriplegia)  BMI:  Body mass index is 18.43 kg/m.  Estimated Nutritional Needs:   Kcal:  1398  Protein:  70-85 grams  Fluid:  >/= 1.5 L    Gaynell Face, MS, RD, LDN Inpatient Clinical Dietitian Pager: [phone] Weekend/After Hours: [phone]

## 2018-04-05 NOTE — Progress Notes (Signed)
Pharmacy Antibiotic Note  Angela Page is a 48 y.o. female admitted from [facility] on 04/05/2018 with pneumonia. Pt with hospitalization in January for complicated UTI. Pt is quadriplegic with indwelling foley and trach.  Pharmacy has been consulted for Cefepime/Vancomycin/Azithromycin dosing. Pt with allergy (hives) to PCN but pt has tolerated cephalosporins in the past.  Noted she is quadriplegic, which would result in an overestimation of renal clearance. SCr up to 0.9 (usual baseline 1 mos ago ~0.45) so this likely indicates some degree of renal impairment.  Weight ~60kg  Plan: Azithromycin 500mg  IV q24h Cefepime 2gm IV now 1gm IV q24h Vancomycin 1250 mg IV now then 1gm IV q24hrs.  Will f/u renal function, micro data, and pt's clinical condition Vanc levels at Css in paraplegic patient or consider random level sooner based on UOP.    Temp (24hrs), Avg:100.4 F (38 C), Min:100.4 F (38 C), Max:100.4 F (38 C)  No results for input(s): WBC, CREATININE, LATICACIDVEN, VANCOTROUGH, VANCOPEAK, VANCORANDOM, GENTTROUGH, GENTPEAK, GENTRANDOM, TOBRATROUGH, TOBRAPEAK, TOBRARND, AMIKACINPEAK, AMIKACINTROU, AMIKACIN in the last 168 hours.  CrCl cannot be calculated (Patient's most recent lab result is older than the maximum 21 days allowed.).    Allergies  Allergen Reactions  . Erythromycin Nausea And Vomiting  . Nitrofurantoin Monohyd Macro Nausea And Vomiting  . Oxybutynin     Dries mouth out   . Penicillins Hives    Has patient had a PCN reaction causing immediate rash, facial/tongue/throat swelling, SOB or lightheadedness with hypotension: Yes Has patient had a PCN reaction causing severe rash involving mucus membranes or skin necrosis: Yes Has patient had a PCN reaction that required hospitalization No Has patient had a PCN reaction occurring within the last 10 years: No-childhood allergy If all of the above answers are "NO", then may proceed with Cephalosporin      Antimicrobials this admission: 3/16 Azithromycin >>  3/16 Cefepime >>  3/16 Vancomycin >>  Microbiology results: 3/16 BCx:  3/16 UCx:  Thank you for allowing pharmacy to be a part of this patient's care.  Sherlon Handing, PharmD, BCPS Clinical pharmacist  **Pharmacist phone directory can now be found on [email or web address] ([email or web address]).  Listed under [place]. 04/05/2018 1:43 AM

## 2018-04-05 NOTE — ED Provider Notes (Signed)
[facility] EMERGENCY DEPARTMENT Provider Note   CSN: [id number] Arrival date & time: 04/05/18  0127    History   Chief Complaint Chief Complaint  Patient presents with   Code Sepsis  Level 5 caveat due to acuity of condition  HPI Angela Page is a 48 y.o. female.     The history is provided by the EMS personnel. The history is limited by the condition of the patient.  Fever  Severity:  Severe Onset quality:  Sudden Timing:  Constant Progression:  Worsening Chronicity:  New Relieved by:  Nothing Worsened by:  Nothing Presents from nursing facility for fever. She has history of quadriplegia, trach in place, PEG tube in place It is reported that she had a high fever tonight and difficulty breathing. No other details are known.  Past Medical History:  Diagnosis Date   Acute on chronic respiratory failure with hypoxia ([place])    Anasarca 06/10/2016   Asthma    At high risk for severe sepsis    Cocaine use 10/08/2013   Decubitus ulcer of buttock, unstageable ([place])    Depression    Diabetes mellitus without complication (HCC)    GERD (gastroesophageal reflux disease)    HCAP (healthcare-associated pneumonia) 06/09/2016   Hepatitis    hx of hepatits frm mono    Kidney stone    Lobar pneumonia (HCC)    Overdose of opiate or related narcotic ([place]) 09/02/2014   Pacemaker    Pleurisy    Polysubstance dependence including opioid type drug, episodic abuse ([place]) 10/27/2013   Protein calorie malnutrition ([place]) 10/31/2015   Quadriparesis ([place])    Quadriplegia and quadriparesis ([place])    Quadriplegia, C5-C7 incomplete ([place]) 10/08/2013   Stage IV pressure ulcer of sacral region ([place]) 10/31/2015   Tracheostomy status ([place])     Patient Active Problem List   Diagnosis Date Noted   Encephalopathy acute 02/14/2018   Sepsis ([place]) 01/31/2018   Acute on chronic respiratory failure with hypoxia (HCC)    Quadriplegia and  quadriparesis (HCC)    Lobar pneumonia (HCC)    At high risk for severe sepsis    Tracheostomy status ([place])    Decubitus ulcer of buttock, unstageable ([place])    Hyperglycemic hyperosmolar nonketotic coma ([place]) 12/16/2017   Lactic acid acidosis 12/16/2017   AKI (acute kidney injury) ([place]) 12/16/2017   Suprapubic catheter ([place]) 12/16/2017   HHNC (hyperglycemic hyperosmolar nonketotic coma) ([place]) 12/16/2017   Elevated lactic acid level    Pressure injury of skin 07/17/2017   Kidney stone 07/16/2017   Right ureteral stone 03/10/2017   UTI (urinary tract infection) 03/09/2017   Ineffective airway clearance    Sepsis secondary to UTI ([place]) 06/30/2016   Autonomic neuropathy 06/10/2016   Presence of permanent cardiac pacemaker 06/10/2016   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   Chronically on opiate therapy 06/10/2016   Indwelling Foley catheter present 06/10/2016   PEG (percutaneous endoscopic gastrostomy) status ([place]) 06/10/2016   Pulmonary hypertension ([place]) 06/10/2016   Hypoalbuminemia 06/10/2016   Abnormal transaminases    Peripheral edema    Tracheostomy dependence (HCC)    Chronic obstructive pulmonary disease (HCC)    Chronic diastolic CHF (congestive heart failure) ([place]) 01/23/2016   Tracheostomy status ([place])    Esophageal dysphagia    Malnutrition of moderate degree 11/01/2015   Tobacco abuse 10/31/2015   Asthma 10/31/2015   History of pneumonia 10/08/2015   Pressure ulcer of contiguous region involving buttock and hip, stage 4 ([place]) 02/26/2015  Hypotension 81/82/9937   Acute metabolic encephalopathy 16/96/7893   Leukocytosis 10/23/2014   Hypokalemia 10/23/2014   Decubitus ulcer of sacral region, stage 4 ([place]) 10/23/2014   Anemia, iron deficiency 10/23/2014   Quadriplegia, C5-C7 incomplete ([place]) 10/22/2014   Depression    Protein-calorie malnutrition, severe ([place]) 09/05/2014   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   Recurrent UTI 03/31/2014   Pressure ulcer of coccygeal region, stage 4 ([place]) 01/06/2014   Stage 4 skin ulcer of sacral region ([place]) 01/06/2014   Neuropathic pain 11/30/2013   Paraplegia following spinal cord injury ([place]) 11/30/2013   Spinal cord injury, cervical region ([place]) 11/30/2013   Chronic pain due to injury 11/30/2013   S/P cervical spinal fusion 10/27/2013   Tracheostomy care ([place]) 10/27/2013   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   SCI (spinal cord injury) 10/11/2013    Past Surgical History:  Procedure Laterality Date   APPENDECTOMY     BACK SURGERY  10/08/2013   fusion of spine, cervical region   Welcome Right 03/09/2017   Procedure: CYSTOSCOPY [name] STENT PLACEMENT;  Surgeon: Irine Seal, MD;  Location: [facility];  Service: Urology;  Laterality: Right;   CYSTOSCOPY W/ URETERAL STENT REMOVAL Right 07/16/2017   Procedure: CYSTOSCOPY WITH STENT REMOVAL;  Surgeon: Franchot Gallo, MD;  Location: [facility];  Service: Urology;  Laterality: Right;   CYSTOSCOPY/URETEROSCOPY/HOLMIUM LASER/STENT PLACEMENT Right 07/16/2017   Procedure: CYSTOSCOPY/URETEROSCOPY/HOLMIUM LASER/STENT PLACEMENT/ALSO STONE EXTRACTION;  Surgeon: Franchot Gallo, MD;  Location: [facility];  Service: Urology;  Laterality: Right;   GASTROSTOMY  11/23/2015   I&D EXTREMITY  10/28/2011   Procedure: IRRIGATION AND DEBRIDEMENT EXTREMITY;  Surgeon: Tennis Must, MD;  Location: [facility];  Service: Orthopedics;  Laterality: Right;  Irrigation and debridement right middle finger   INSERTION OF SUPRAPUBIC CATHETER N/A 07/28/2016   Procedure: INSERTION OF SUPRAPUBIC CATHETER;  Surgeon: Franchot Gallo, MD;  Location: [facility];  Service: Urology;  Laterality: N/A;   IR GENERIC HISTORICAL  11/23/2015   IR GASTROSTOMY TUBE MOD SED 11/23/2015 [facility]   IR PATIENT EVAL TECH 0-60 MINS  12/18/2017   IR REPLACE G-TUBE  SIMPLE WO FLUORO  12/19/2017   LITHOTRIPSY  08/2017   TRACHEOSTOMY       OB History   No obstetric history on file.      Home Medications    Prior to Admission medications   Medication Sig Start Date End Date Taking? Authorizing Provider  acetaminophen (TYLENOL) 500 MG tablet Take 1,000 mg by mouth every 6 (six) hours as needed for mild pain, moderate pain or headache (Fever >100.4).    [provider]  albuterol (PROVENTIL HFA;VENTOLIN HFA) 108 (90 Base) MCG/ACT inhaler Inhale 1-2 puffs into the lungs every 6 (six) hours as needed for wheezing or shortness of breath.    [provider]  Amino Acids-Protein Hydrolys (FEEDING SUPPLEMENT, PRO-STAT SUGAR FREE 64,) LIQD Place 30 mLs into feeding tube 2 (two) times daily. 02/17/18   Danford, Suann Larry, MD  baclofen (LIORESAL) 20 MG tablet Take 1 tablet (20 mg total) by mouth every 8 (eight) hours as needed for muscle spasms. 02/17/18   Danford, Suann Larry, MD  collagenase (SANTYL) ointment Apply topically daily. 02/18/18   Danford, Suann Larry, MD  diphenhydrAMINE (BENADRYL) 50 MG tablet Take 1 tablet (50 mg total) by mouth at bedtime as needed for itching. 02/18/18   Danford, Suann Larry, MD  DULoxetine (CYMBALTA) 30 MG capsule Take  1 capsule (30 mg total) by mouth 2 (two) times daily. 02/17/18   Danford, Suann Larry, MD  famotidine (PEPCID) 40 MG/5ML suspension Place 2.5 mLs (20 mg total) into feeding tube at bedtime. Patient taking differently: Place 40 mg into feeding tube at bedtime.  12/31/17   Arrien, Jimmy Picket, MD  furosemide (LASIX) 40 MG tablet Place 1 tablet (40 mg total) into feeding tube daily. 01/01/18 02/14/18  Arrien, Jimmy Picket, MD  gabapentin (NEURONTIN) 100 MG capsule Take 1 capsule (100 mg total) by mouth 3 (three) times daily. 02/17/18   Danford, Suann Larry, MD  guaiFENesin (ROBITUSSIN) 100 MG/5ML liquid Take 300 mg by mouth every 6 (six) hours.     [provider]  Heparin  Sodium, Porcine, PF 5000 UNIT/ML SOLN Inject 5,000 Units as directed every 8 (eight) hours.     [provider]  insulin aspart (NOVOLOG) 100 UNIT/ML injection Inject 10 Units into the skin every 4 (four) hours. For glucose 150 to 200 use 1 units, for glucose 201-250 use 2 units, for 251 to 300 use 4 units, for 301 to 350 use 6 units, for 351 to 400 use 8 units, for 401 or greater use 10 units. Patient taking differently: Inject 0-10 Units into the skin See admin instructions. Check FSBS every 6 hours. Give insulin according to sliding scale: If BS is 0-150 = 0u, 151-200 = 2u, 201-250 = 4u, 251-300 = 6u, 301-350 = 8u, 351-400 = 10u. >400 call MD 12/31/17   Arrien, Jimmy Picket, MD  insulin detemir (LEVEMIR) 100 UNIT/ML injection Inject 0.1 mLs (10 Units total) into the skin at bedtime. 02/18/18   Danford, Suann Larry, MD  LORazepam (ATIVAN) 0.5 MG tablet Take 1 tablet (0.5 mg total) by mouth every 8 (eight) hours as needed for anxiety. 02/18/18   Danford, Suann Larry, MD  Melatonin 3 MG CAPS Take 1 capsule (3 mg total) by mouth at bedtime as needed (For sleep). 02/18/18   Danford, Suann Larry, MD  Melatonin 3 MG TABS Take 3 mg by mouth at bedtime.     [provider]  metoCLOPramide (REGLAN) 5 MG tablet Take 5 mg by mouth every 6 (six) hours as needed (GERD). Via G-tube    [provider]  midodrine (PROAMATINE) 10 MG tablet Take 10 mg by mouth 3 (three) times daily.    [provider]  Nutritional Supplements (FEEDING SUPPLEMENT, GLUCERNA 1.2 CAL,) LIQD Place 1,000 mLs into feeding tube daily. 02/18/18   Danford, Suann Larry, MD  oxyCODONE (OXY IR/ROXICODONE) 5 MG immediate release tablet Place 1 tablet (5 mg total) into feeding tube every 4 (four) hours as needed (breakthrough pain). 02/18/18   Danford, Suann Larry, MD  OXYGEN Inhale 3 L into the lungs continuous.    [provider]  polyethylene glycol (MIRALAX / GLYCOLAX) packet Take 17 g by mouth  daily. Hold if diarrhea. 01/01/18   Arrien, Jimmy Picket, MD  temazepam (RESTORIL) 7.5 MG capsule Take 1 capsule (7.5 mg total) by mouth at bedtime as needed for sleep. 02/18/18   Edwin Dada, MD  tiotropium (SPIRIVA) 18 MCG inhalation capsule Place 18 mcg into inhaler and inhale daily.    [provider]  umeclidinium bromide (INCRUSE ELLIPTA) 62.5 MCG/INH AEPB Inhale 1 puff into the lungs daily. 02/18/18   Danford, Suann Larry, MD  zinc oxide (BALMEX) 11.3 % CREA cream Apply 1 application topically 3 (three) times daily as needed (skin redness/irritation).    [provider]  Family History Family History  Problem Relation Age of Onset   Hypertension Maternal Uncle    Kidney failure Maternal Uncle    Colon cancer Mother    Lung cancer Father    Hypertension Brother    Kidney failure Maternal Aunt    Kidney failure Maternal Uncle    Colon cancer Maternal Grandfather    Esophageal cancer Neg Hx     Social History Social History   Tobacco Use   Smoking status: Current Every Day Smoker    Packs/day: 1.00    Years: 25.00    Pack years: 25.00    Types: Cigarettes   Smokeless tobacco: Never Used  Substance Use Topics   Alcohol use: Never    Frequency: Never   Drug use: No     Allergies   Erythromycin; Nitrofurantoin monohyd macro; Oxybutynin; and Penicillins   Review of Systems Review of Systems  Unable to perform ROS: Acuity of condition  Constitutional: Positive for fever.     Physical Exam Updated Vital Signs BP 130/85    Pulse (!) 151    Temp (!) 100.4 F (38 C) (Temporal)    Resp (!) 42    LMP 05/27/2016 (Within Days) Comment: Irregular periods since October 2015.   SpO2 98%   Physical Exam CONSTITUTIONAL: Ill-appearing HEAD: Normocephalic/atraumatic EYES: EOMI/PERRL ENMT: Mucous membranes dry NECK: Trach in place CV: Tachycardic LUNGS: Coarse breath sounds bilaterally, respiratory distress noted ABDOMEN:  soft, nontender, PEG tube in place GU: Suprapubic catheter noted NEURO: Pt is awake and alert but not responding to voice EXTREMITIES: pulses normal/equal, full ROM SKIN: Large area of erythema and skin breakdown to sacrum.  Patient covered in stool PSYCH: Unable to assess   ED Treatments / Results  Labs (all labs ordered are listed, but only abnormal results are displayed) Labs Reviewed  LACTIC ACID, PLASMA - Abnormal; Notable for the following components:      Result Value   Lactic Acid, Venous 3.6 (*)    All other components within normal limits  COMPREHENSIVE METABOLIC PANEL - Abnormal; Notable for the following components:   Sodium 133 (*)    Chloride 93 (*)    Glucose, Bld 380 (*)    Total Protein 8.3 (*)    Albumin 3.2 (*)    Alkaline Phosphatase 162 (*)    Anion gap 18 (*)    All other components within normal limits  CBC WITH DIFFERENTIAL/PLATELET - Abnormal; Notable for the following components:   WBC 31.4 (*)    Hemoglobin 11.1 (*)    MCH 25.9 (*)    MCHC 29.6 (*)    RDW 21.1 (*)    Platelets 558 (*)    Neutro Abs 29.3 (*)    Abs Immature Granulocytes 0.21 (*)    All other components within normal limits  URINALYSIS, ROUTINE W REFLEX MICROSCOPIC - Abnormal; Notable for the following components:   APPearance CLOUDY (*)    pH 9.0 (*)    Glucose, UA >=500 (*)    Protein, ur 100 (*)    Nitrite POSITIVE (*)    Leukocytes,Ua LARGE (*)    Bacteria, UA RARE (*)    All other components within normal limits  CULTURE, BLOOD (ROUTINE X 2)  CULTURE, BLOOD (ROUTINE X 2)  URINE CULTURE  INFLUENZA PANEL BY PCR (TYPE A & B)  LACTIC ACID, PLASMA    EKG EKG Interpretation  Date/Time:  Monday April 05 2018 01:35:24 EDT Ventricular Rate:  152 PR Interval:  QRS Duration: 65 QT Interval:  230 QTC Calculation: 366 R Axis:   37 Text Interpretation:  Sinus tachycardia Multiple ventricular premature complexes Aberrant conduction of SV complex(es) LAE, consider biatrial  enlargement RSR' in V1 or V2, probably normal variant Probable LVH with secondary repol abnrm Baseline wander in lead(s) V3 Interpretation limited secondary to artifact Confirmed by Ripley Fraise [phone]) on 04/05/2018 2:16:41 AM   Radiology Dg Chest Portable 1 View  Result Date: 04/05/2018 CLINICAL DATA:  Cough EXAM: PORTABLE CHEST 1 VIEW COMPARISON:  02/14/2018 FINDINGS: Unchanged position of left chest wall pacemaker leads. There is mild opacity at the right lung base. No pleural effusion or pneumothorax. Tracheostomy tube tip is just above the level of the clavicular heads. IMPRESSION: Mild right basilar opacity, likely atelectasis. Electronically Signed   By: Ulyses Jarred M.D.   On: 04/05/2018 01:54    Procedures Procedures  CRITICAL CARE Performed by: Sharyon Cable Total critical care time: 45 minutes Critical care time was exclusive of separately billable procedures and treating other patients. Critical care was necessary to treat or prevent imminent or life-threatening deterioration. Critical care was time spent personally by me on the following activities: development of treatment plan with patient and/or surrogate as well as nursing, discussions with consultants, evaluation of patient's response to treatment, examination of patient, obtaining history from patient or surrogate, ordering and performing treatments and interventions, ordering and review of laboratory studies, ordering and review of radiographic studies, pulse oximetry and re-evaluation of patient's condition.   Medications Ordered in ED Medications  azithromycin (ZITHROMAX) 500 mg in sodium chloride 0.9 % 250 mL IVPB (0 mg Intravenous Stopped 04/05/18 0334)  vancomycin (VANCOCIN) 1,250 mg in sodium chloride 0.9 % 250 mL IVPB (1,250 mg Intravenous New Bag/Given 04/05/18 0230)  vancomycin (VANCOCIN) IVPB 1000 mg/200 mL premix (has no administration in time range)  ceFEPIme (MAXIPIME) 1 g in sodium chloride 0.9 % 100  mL IVPB (has no administration in time range)  sodium chloride 0.9 % bolus 1,000 mL (0 mLs Intravenous Stopped 04/05/18 0334)    And  sodium chloride 0.9 % bolus 500 mL (500 mLs Intravenous New Bag/Given 04/05/18 0220)    And  sodium chloride 0.9 % bolus 250 mL (250 mLs Intravenous New Bag/Given 04/05/18 0220)  acetaminophen (TYLENOL) solution 500 mg (500 mg Per Tube Given 04/05/18 0213)  ceFEPIme (MAXIPIME) 2 g in sodium chloride 0.9 % 100 mL IVPB (0 g Intravenous Stopped 04/05/18 0334)     Initial Impression / Assessment and Plan / ED Course  I have reviewed the triage vital signs and the nursing notes.  Pertinent labs & imaging results that were available during my care of the patient were reviewed by me and considered in my medical decision making (see chart for details).        2:24 AM Patient presents nursing facility for fever.  She has multiple rounds of infection including sacral wound, indwelling Foley, as well as respiratory illness.  Code sepsis has been called.  Influenza testing has been ordered.  Discussed the case with her mother [name], she last saw her on March 12 before the coronavirus quarantine.  She was at her baseline on that day.  She does not have any further details [phone] Patient currently full code 3:17 AM HR improved Last BP check >100 Unclear cause of sepsis as multiple entry points (urine, skin, respiratory) IV Fluids/antibiotics ordered Will need admit 3:37 AM Pt may also have complicated UTI associated with indwelling catheter  D/w dr opyd for admit Final Clinical Impressions(s) / ED Diagnoses   Final diagnoses:  Sepsis, due to unspecified organism, unspecified whether acute organ dysfunction present [facility])  Hypoxia  Complicated UTI (urinary tract infection)    ED Discharge Orders    None       Ripley Fraise, MD 04/05/18 [phone]

## 2018-04-05 NOTE — ED Notes (Signed)
IV RN [phone] collected 2nd set of blood cultures.

## 2018-04-05 NOTE — ED Triage Notes (Signed)
Pt come via [facility] from [facility] for a fever, pt has a trach with humidified o2, nursing home attempted PO tylenol. Pt has several bed sores and indwelling urinary cath.

## 2018-04-05 NOTE — Consult Note (Signed)
[facility] Nurse wound consult note Reason for Consult: multiple pressure injuries Wound type: 1. Stage 3 pressure injury left ischium 2. Stage 3 pressure injury right ischium 3. Stage 3 sacrum 4. Stage 3 proximal sacrum 5. Stage 1 medial left knee 6. Stage 1 medial right knee  Fungal overgrowth axilla, groin, perineal, and buttocks. Notified CCM to consider systemic antifungal  Pressure Injury POA: Yes Measurement: 1. Stage 3 pressure injury left ischium; 4cm c 1cm x 0.3cm  2. Stage 3 pressure injury right ischium; 3cm x 2cm x 0.3cm  3. Stage 3 sacrum; 5cm x 11cm x 0.1cm  4. Stage 3 proximal sacrum; 2cm x 2cm x 0.5cm  5. Stage 1 medial left knee: 1cm x 1cm x 0 6. Stage 1 medial right knee1cm x 1cm x 0 Fungal overgrowth related to moisture  Wound bed: 1. Stage 3 pressure injury left ischium; 90% pink/10% fibrinous  2. Stage 3 pressure injury right ischium; pink, non granular 3. Stage 3 sacrum; pink, moist, non granular 4. Stage 3 proximal sacrum; 100% fibrinous base, non healing 5. Stage 1 medial left knee; intact, non blanchable skin 6. Stage 1 medial right knee; intact, non blanchable skin    Drainage (amount, consistency, odor) no significant drainage from any of her wounds Periwound: thin, frail Dressing procedure/placement/frequency:  1. Saline moist gauze to the sacral and ischial wounds 2. Antimicrobial wicking fabric under the breast and in the bilateral axilla 3. Prevalon boots bilateral, patient high risk Progressa in place while in ICU, orders entered for low air loss mattress upon transfer from the ICU   Discussed POC with bedside nurse.  Re consult if needed, will not follow at this time. Thanks  Chukwudi Ewen [employer], RN,CWOCN, CNS, [place] [phone])

## 2018-04-05 NOTE — Procedures (Signed)
Tracheostomy Change Note  Patient Details:   Name: Sherece Gambrill DOB: 1970-07-28 MRN: [id number]    Airway Documentation: Lurline Idol changed to a #6 shiley cuffed per orders so patient could be placed on ventilator.    Evaluation  O2 sats: stable throughout Complications: No apparent complications Patient did tolerate procedure well. Bilateral Breath Sounds: Rhonchi    Ander Purpura 04/05/2018, 12:00 PM

## 2018-04-05 NOTE — Consult Note (Signed)
Attending:    Subjective: Consulted today for increasing lethargy, confusion and worsening respiratory status today. She has a history of quadriplegia after cervical spine trauma, has a chronic tracheostomy and PEG. Admitted for fever in setting of purulent drainage from her suprapubic catheter. PCCM consulted today for worsening confusion and respiratory status.   Objective: Vitals:   04/05/18 1115 04/05/18 1130 04/05/18 1145 04/05/18 1151  BP: 115/75 108/71    Pulse: (!) 119 (!) 119 (!) 119   Resp: (!) 36 (!) 36    Temp:      TempSrc:      SpO2: 98% 98% 99% 100%  Weight:      Height:    5\' 2"  (1.575 m)   Vent Mode: PRVC FiO2 (%):  [28 %-98 %] 40 % Set Rate:  [16 bmp] 16 bmp Vt Set:  [400 mL] 400 mL PEEP:  [5 cmH20] 5 cmH20 Plateau Pressure:  [23 cmH20] 23 cmH20  Intake/Output Summary (Last 24 hours) at 04/05/2018 1210 Last data filed at 04/05/2018 1137 Gross per 24 hour  Intake 3517.88 ml  Output 500 ml  Net 3017.88 ml    General:  Able to open eyes, nod head, in bed, respiratory rate elevated HENT: NCAT OP with dry mucus membranes, trach clean, dry PULM: rhonchi bilaterally, increased respiratory effort CV: tachy, regular, no mgr GI: BS+, soft, nontender MSK: normal bulk and tone Neuro: drowsy, opens eyes to voice and nods head, had some    CBC    Component Value Date/Time   WBC 31.4 (H) 04/05/2018 0145   RBC 4.29 04/05/2018 0145   HGB 11.1 (L) 04/05/2018 0145   HCT 37.5 04/05/2018 0145   PLT 558 (H) 04/05/2018 0145   MCV 87.4 04/05/2018 0145   MCH 25.9 (L) 04/05/2018 0145   MCHC 29.6 (L) 04/05/2018 0145   RDW 21.1 (H) 04/05/2018 0145   LYMPHSABS 1.1 04/05/2018 0145   MONOABS 0.7 04/05/2018 0145   EOSABS 0.1 04/05/2018 0145   BASOSABS 0.1 04/05/2018 0145    BMET    Component Value Date/Time   NA 133 (L) 04/05/2018 0145   NA 136 (A) 09/23/2016   K 4.1 04/05/2018 0145   CL 93 (L) 04/05/2018 0145   CL 85 09/23/2016   CO2 22 04/05/2018 0145   GLUCOSE 380 (H) 04/05/2018 0145   BUN 20 04/05/2018 0145   BUN 35 (A) 09/23/2016   CREATININE 0.90 04/05/2018 0145   CALCIUM 9.7 04/05/2018 0145   CALCIUM 10.4 09/23/2016   GFRNONAA >60 04/05/2018 0145   GFRAA >60 04/05/2018 0145    CXR images reviewed showing a right lower lobe infiltrate vs atelectasis, pace maker in place  Impression/Plan:  Acute respiratory failure with hypoxemia: based on physical exam sounds like bronchitis, given increased work of breathing and neuromuscular weakness from cervical spine injury, she needs mechanical ventilation for further evaluation > remove home cuff less tracheostomy > replaced with cuffed trach > start mechanical ventilation > daily CXR  UTI, Possible tracheobronchitis, increased risk for resistant organisms > continue vanc/cefepime  Multiple pressure wounds present on admission > WOC consult   My cc time 31 minutes  Roselie Awkward, MD [place] PCCM Pager: [phone] Cell: [phone] After 3pm or if no response, call [phone]

## 2018-04-05 NOTE — Progress Notes (Signed)
Admitted to 4E14 sacral wound drsg changed wound ostomy consult in chart. Cleansed and changed PEG tube and suprapubic catheteh drsg purulent drainage from suprapubic cath site.

## 2018-04-05 NOTE — Progress Notes (Signed)
RT NOTES: Assessed patient to find patient with labored breathing, rate of 37. Patient suctioned for copious thick yellow secretions. Patient still labored. Sats 97%. HR 115. RN called and notified of patient's work of breathing, requested nurse page MD to come assess.

## 2018-04-05 NOTE — Progress Notes (Signed)
Lactic acid 4.4 patient with increased work of breathing, Powell MD at bedside to assess patient new orders received, will continue to monitor.

## 2018-04-05 NOTE — ED Notes (Signed)
Delay in lab draw admitting MD at bedside. 

## 2018-04-05 NOTE — Progress Notes (Signed)
PROGRESS NOTE    Angela Page  NTZ:[id number] DOB: [birth year] DOA: 04/05/2018 PCP: Default, Provider, MD   Brief Narrative:  Per HPI Angela Page is Angela Page 48 y.o. female with medical history significant for traumatic cervical spine fracture with resulting quadriplegia status post trach, PEG, suprapubic catheter, insulin-dependent diabetes mellitus, chronic pain, sick sinus syndrome with pacer, chronic normocytic anemia, COPD with chronic hypoxic respiratory failure, chronic hypotension, and decubitus ulcers, now presenting to the emergency department from her SNF for evaluation of fever. The patient is unable to contribute to the history due to her clinical condition and there is no answer at her SNF x2.   ED Course: Upon arrival to the ED, patient is found to be febrile to 40.5 C, saturating 92% on nonrebreather, tachypneic to the 40s, tachycardic in 150s, and with blood pressure 94/75.  EKG features sinus tachycardia with rate 152, PVCs, and LVH with repolarization abnormality.  Chest x-ray is notable for mild right basilar opacity, likely atelectasis.  Chemistry panel features Niklas Chretien glucose of 380.  CBC is notable for leukocytosis to 31,400 and Philip Eckersley chronic thrombocytosis with platelets 558,000.  Urinalysis features large leukocytes and positive nitrites.  Lactic acid is elevated to 3.6.  Blood and urine cultures were collected, 30 cc/kg NS bolus was given, and the patient was treated with vancomycin, cefepime, and azithromycin for suspected UTI and/or pneumonia.  Tachycardia and tachypnea improved, blood pressure remained stable, and the patient will be observed in the progressive unit for ongoing evaluation and management.   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 Sepsis secondary to UTI    - Presents from SNF with fever, found to be febrile with tachycardia, tachypnea, marked leukocytosis, and elevated lactate  - CXR with mild opacity in at right base - Per admitting provider, tachypnea had improved, but she seems worse on my exam with RR in high 30's with signs of increased WOB on exam - UA is compatible with infection (positive nitrite, rare bacteria, WBC's); she has hx of pseudomonal UTIs.  Per nursing note, purulent drainage noted when suprapubic catheter dressing was changed. - orders placed to exchange suprapubic catheter  - Influenza PCR negative  - Abd exam benign  - She has extensive pressure ulcer, but this does not appear infected on my exam - Lactate improved, but now worse again -> bolus additional 1 L NS, continue IVF - Continue vancomycin/cefepime/azithromycin for now  2. COPD; tracheostomy status; chronic hypoxic respiratory failure  Respiratory Distress - She requires 3 Lpm supplemental O2 at baseline  - Currently satting well on trach mask, but significantly tachypneic  - Continue supplemental O2, Spiriva and as-needed albuterol  - Discussed case with PCCM given tachypnea/increased WOB, they'll come to evaluate  # Acute Metabolic Encephalopathy: - likely 2/2 sepsis above, consider additional workup if not improving with abx for presumed UTI - at baseline, talks and communicates normally, able to use tablet with help  3. Insulin-dependent DM  - A1c was 9.4% in January  - Continue Lantus and Novolog   4. Spastic quadriplegia; chronic pain  - Continue Cymbalta, as-needed baclofen, APAP, and oxycodone    5. Anxiety  -  Continue Cymbalta and as-needed Ativan    6. Pressure injury  - Present on admission  - Consult wound care RN    7. Chronic hypotension  - continue midodrine - SBP in the 90's   Discussed with nursing facility.  Transferring nurse not present.  They were only able to tell me she was transferred for fever, but unsure when her AMS began or if she had other sx.  DVT prophylaxis: heparin Code Status: full code, discussed  Family Communication: discussed with mother this AM.  Discussed pt is very sick at this time.  Confirmed full code.  Discussed baseline status (see above). Disposition Plan: pending   Consultants:   PCCM  Procedures:   none  Antimicrobials: Anti-infectives (From admission, onward)   Start     Dose/Rate Route Frequency Ordered Stop   04/06/18 0600  vancomycin (VANCOCIN) IVPB 1000 mg/200 mL premix     1,000 mg 200 mL/hr over 60 Minutes Intravenous Every 24 hours 04/05/18 0233     04/06/18 0400  ceFEPIme (MAXIPIME) 1 g in sodium chloride 0.9 % 100 mL IVPB     1 g 200 mL/hr over 30 Minutes Intravenous Every 24 hours 04/05/18 0233     04/05/18 0300  metroNIDAZOLE (FLAGYL) IVPB 500 mg  Status:  Discontinued     500 mg 100 mL/hr over 60 Minutes Intravenous Every 8 hours 04/05/18 0149 04/05/18 0151   04/05/18 0300  azithromycin (ZITHROMAX) 500 mg in sodium chloride 0.9 % 250 mL IVPB     500 mg 250 mL/hr over 60 Minutes Intravenous Daily 04/05/18 0152     04/05/18 0200  ceFEPIme (MAXIPIME) 2 g in sodium chloride 0.9 % 100 mL IVPB     2 g 200 mL/hr over 30 Minutes Intravenous STAT 04/05/18 0147 04/05/18 0334   04/05/18 0200  vancomycin (VANCOCIN) 1,250 mg in sodium chloride 0.9 % 250 mL IVPB     1,250 mg 166.7 mL/hr over 90 Minutes Intravenous  Once 04/05/18 0154 04/05/18 0425   04/05/18 0145  vancomycin (VANCOCIN) IVPB 1000 mg/200 mL premix  Status:  Discontinued     1,000 mg 200 mL/hr over 60 Minutes Intravenous  Once 04/05/18 0138 04/05/18 0154   04/05/18 0145  levofloxacin (LEVAQUIN) IVPB 750 mg  Status:  Discontinued     750 mg 100 mL/hr over 90 Minutes Intravenous  Once 04/05/18 0138 04/05/18 0144     Subjective: Attends to voice.  Does not speak.  Objective: Vitals:   04/05/18 0551 04/05/18 0730 04/05/18 0849 04/05/18 0914  BP:  (!) 99/54  97/60  Pulse:  (!) 51 (!) 116 (!) 118  Resp:  (!) 32 (!) 37 (!) 38  Temp:  97.6 F (36.4 C)    TempSrc:  Oral    SpO2:  98% 97% 96%  Weight: 45.7 kg       Intake/Output Summary (Last 24 hours) at 04/05/2018 0920 Last data filed at 04/05/2018 0500 Gross per 24 hour  Intake 2350 ml  Output 500 ml  Net 1850 ml   Filed Weights   04/05/18 0551  Weight: 45.7 kg    Examination:  General exam: uncomfortable with resp distress Respiratory system: increased wob, tachypneic in the 30's Cardiovascular system: tachycardic  Gastrointestinal system: Peg tube, suprapubic catheter Central nervous system: able to track.  Looks at me, but does not speak.   Extremities: no LEE Skin: large sacral decubitus ulcer, does not appear infected  Data Reviewed: I have personally reviewed following labs and  imaging studies  CBC: Recent Labs  Lab 04/05/18 0145  WBC 31.4*  NEUTROABS 29.3*  HGB 11.1*  HCT 37.5  MCV 87.4  PLT 073*   Basic Metabolic Panel: Recent Labs  Lab 04/05/18 0145  NA 133*  K 4.1  CL 93*  CO2 22  GLUCOSE 380*  BUN 20  CREATININE 0.90  CALCIUM 9.7   GFR: Estimated Creatinine Clearance: 47.1 mL/min (by C-G formula based on SCr of 0.9 mg/dL). Liver Function Tests: Recent Labs  Lab 04/05/18 0145  AST 25  ALT 18  ALKPHOS 162*  BILITOT 0.5  PROT 8.3*  ALBUMIN 3.2*   No results for input(s): LIPASE, AMYLASE in the last 168 hours. No results for input(s): AMMONIA in the last 168 hours. Coagulation Profile: No results for input(s): INR, PROTIME in the last 168 hours. Cardiac Enzymes: No results for input(s): CKTOTAL, CKMB, CKMBINDEX, TROPONINI in the last 168 hours. BNP (last 3 results) Recent Labs    04/28/17 1110  PROBNP 62.0   HbA1C: No results for input(s): HGBA1C in the last 72 hours. CBG: Recent Labs  Lab 04/05/18 0435 04/05/18  0825  GLUCAP 216* 164*   Lipid Profile: No results for input(s): CHOL, HDL, LDLCALC, TRIG, CHOLHDL, LDLDIRECT in the last 72 hours. Thyroid Function Tests: No results for input(s): TSH, T4TOTAL, FREET4, T3FREE, THYROIDAB in the last 72 hours. Anemia Panel: No results for input(s): VITAMINB12, FOLATE, FERRITIN, TIBC, IRON, RETICCTPCT in the last 72 hours. Sepsis Labs: Recent Labs  Lab 04/05/18 0145 04/05/18 0416 04/05/18 0759  LATICACIDVEN 3.6* 1.4 4.4*    No results found for this or any previous visit (from the past 240 hour(s)).       Radiology Studies: Dg Chest Portable 1 View  Result Date: 04/05/2018 CLINICAL DATA:  Cough EXAM: PORTABLE CHEST 1 VIEW COMPARISON:  02/14/2018 FINDINGS: Unchanged position of left chest wall pacemaker leads. There is mild opacity at the right lung base. No pleural effusion or pneumothorax. Tracheostomy tube tip is just above the level of the clavicular heads. IMPRESSION: Mild right basilar opacity, likely atelectasis. Electronically Signed   By: Ulyses Jarred M.D.   On: 04/05/2018 01:54        Scheduled Meds: . DULoxetine  30 mg Oral BID  . famotidine  20 mg Per Tube Once  . famotidine  20 mg Per Tube QHS  . gabapentin  100 mg Oral TID  . heparin  5,000 Units Subcutaneous Q8H  . insulin aspart  0-9 Units Subcutaneous Q4H  . insulin glargine  5 Units Subcutaneous QHS  . Melatonin  3 mg Oral QHS  . metoCLOPramide  5 mg Oral TID AC & HS  . midodrine  10 mg Oral TID WC  . polyethylene glycol  17 g Oral Daily  . sodium chloride flush  3 mL Intravenous Q12H  . umeclidinium bromide  1 puff Inhalation Daily   Continuous Infusions: . azithromycin Stopped (04/05/18 0334)  . [START ON 04/06/2018] ceFEPime (MAXIPIME) IV    . lactated ringers    . sodium chloride 999 mL/hr at 04/05/18 0914  . [START ON 04/06/2018] vancomycin       LOS: 0 days    Time spent: 35 min critical care time for sepsis 2/2 UTI, respiratory distress    Fayrene Helper, MD [facility] Pager AMION  If 7PM-7AM, please contact night-coverage [email or web address] Password [email or web address] 04/05/2018, 9:20 AM

## 2018-04-06 ENCOUNTER — Inpatient Hospital Stay (HOSPITAL_COMMUNITY): Payer: Medicare Other

## 2018-04-06 DIAGNOSIS — I361 Nonrheumatic tricuspid (valve) insufficiency: Secondary | ICD-10-CM

## 2018-04-06 DIAGNOSIS — R6521 Severe sepsis with septic shock: Secondary | ICD-10-CM

## 2018-04-06 LAB — CBC WITH DIFFERENTIAL/PLATELET
Abs Immature Granulocytes: 0.5 10*3/uL — ABNORMAL HIGH (ref 0.00–0.07)
BASOS ABS: 0.1 10*3/uL (ref 0.0–0.1)
Basophils Relative: 0 %
Eosinophils Absolute: 0.6 10*3/uL — ABNORMAL HIGH (ref 0.0–0.5)
Eosinophils Relative: 2 %
HCT: 21.7 % — ABNORMAL LOW (ref 36.0–46.0)
Hemoglobin: 6.2 g/dL — CL (ref 12.0–15.0)
Immature Granulocytes: 2 %
Lymphocytes Relative: 6 %
Lymphs Abs: 1.7 10*3/uL (ref 0.7–4.0)
MCH: 26.7 pg (ref 26.0–34.0)
MCHC: 28.6 g/dL — ABNORMAL LOW (ref 30.0–36.0)
MCV: 93.5 fL (ref 80.0–100.0)
Monocytes Absolute: 0.8 10*3/uL (ref 0.1–1.0)
Monocytes Relative: 3 %
Neutro Abs: 25.4 10*3/uL — ABNORMAL HIGH (ref 1.7–7.7)
Neutrophils Relative %: 87 %
Platelets: 294 10*3/uL (ref 150–400)
RBC: 2.32 MIL/uL — ABNORMAL LOW (ref 3.87–5.11)
RDW: 21.7 % — ABNORMAL HIGH (ref 11.5–15.5)
WBC: 29 10*3/uL — ABNORMAL HIGH (ref 4.0–10.5)
nRBC: 0 % (ref 0.0–0.2)

## 2018-04-06 LAB — CBC
HCT: 31.2 % — ABNORMAL LOW (ref 36.0–46.0)
HCT: 31.6 % — ABNORMAL LOW (ref 36.0–46.0)
Hemoglobin: 9.2 g/dL — ABNORMAL LOW (ref 12.0–15.0)
Hemoglobin: 9.4 g/dL — ABNORMAL LOW (ref 12.0–15.0)
MCH: 26.8 pg (ref 26.0–34.0)
MCH: 26.7 pg (ref 26.0–34.0)
MCHC: 29.5 g/dL — ABNORMAL LOW (ref 30.0–36.0)
MCHC: 29.7 g/dL — ABNORMAL LOW (ref 30.0–36.0)
MCV: 91 fL (ref 80.0–100.0)
MCV: 89.8 fL (ref 80.0–100.0)
Platelets: 301 10*3/uL (ref 150–400)
Platelets: 334 10*3/uL (ref 150–400)
RBC: 3.43 MIL/uL — ABNORMAL LOW (ref 3.87–5.11)
RBC: 3.52 MIL/uL — ABNORMAL LOW (ref 3.87–5.11)
RDW: 19.4 % — ABNORMAL HIGH (ref 11.5–15.5)
RDW: 19.6 % — ABNORMAL HIGH (ref 11.5–15.5)
WBC: 28.8 10*3/uL — ABNORMAL HIGH (ref 4.0–10.5)
WBC: 26.4 10*3/uL — ABNORMAL HIGH (ref 4.0–10.5)
nRBC: 0 % (ref 0.0–0.2)
nRBC: 0 % (ref 0.0–0.2)

## 2018-04-06 LAB — BASIC METABOLIC PANEL
Anion gap: 8 (ref 5–15)
BUN: 20 mg/dL (ref 6–20)
CO2: 21 mmol/L — ABNORMAL LOW (ref 22–32)
Calcium: 8.6 mg/dL — ABNORMAL LOW (ref 8.9–10.3)
Chloride: 113 mmol/L — ABNORMAL HIGH (ref 98–111)
Creatinine, Ser: 0.61 mg/dL (ref 0.44–1.00)
GFR calc non Af Amer: >60 mL/min (ref >60)
Glucose, Bld: 83 mg/dL (ref 70–99)
Potassium: 4 mmol/L (ref 3.5–5.1)
Sodium: 142 mmol/L (ref 135–145)

## 2018-04-06 LAB — ECHOCARDIOGRAM COMPLETE
Height: 62 in
Weight: 1583.78 oz

## 2018-04-06 LAB — ABO/RH: ABO/RH(D): A POS

## 2018-04-06 LAB — PROTIME-INR
INR: 1.2 (ref 0.8–1.2)
Prothrombin Time: 15.3 seconds — ABNORMAL HIGH (ref 11.4–15.2)

## 2018-04-06 LAB — GLUCOSE, CAPILLARY
GLUCOSE-CAPILLARY: 105 mg/dL — AB (ref 70–99)
Glucose-Capillary: 171 mg/dL — ABNORMAL HIGH (ref 70–99)
Glucose-Capillary: 127 mg/dL — ABNORMAL HIGH (ref 70–99)
Glucose-Capillary: 69 mg/dL — ABNORMAL LOW (ref 70–99)
Glucose-Capillary: 128 mg/dL — ABNORMAL HIGH (ref 70–99)
Glucose-Capillary: 70 mg/dL (ref 70–99)
Glucose-Capillary: 89 mg/dL (ref 70–99)

## 2018-04-06 LAB — PREPARE RBC (CROSSMATCH)

## 2018-04-06 LAB — MAGNESIUM: Magnesium: 1.9 mg/dL (ref 1.7–2.4)

## 2018-04-06 MED ORDER — DEXTROSE 50 % IV SOLN
12.5000 g | INTRAVENOUS | Status: AC
  Administered 2018-04-06: 12.5 g via INTRAVENOUS

## 2018-04-06 MED ORDER — IPRATROPIUM-ALBUTEROL 0.5-2.5 (3) MG/3ML IN SOLN: 3.0000 mL | Freq: Four times a day (QID) | RESPIRATORY_TRACT | Status: DC

## 2018-04-06 MED ORDER — IPRATROPIUM-ALBUTEROL 0.5-2.5 (3) MG/3ML IN SOLN
3.0000 mL | Freq: Four times a day (QID) | RESPIRATORY_TRACT | Status: DC
  Administered 2018-04-06 – 2018-04-11 (×21): 3 mL via RESPIRATORY_TRACT
  Filled 2018-04-06 (×21): qty 3

## 2018-04-06 MED ORDER — CHLORHEXIDINE GLUCONATE CLOTH 2 % EX PADS
6.0000 | MEDICATED_PAD | Freq: Every day | CUTANEOUS | Status: AC
  Administered 2018-04-07 – 2018-04-10 (×3): 6 via TOPICAL

## 2018-04-06 MED ORDER — PANTOPRAZOLE SODIUM 40 MG IV SOLR
40.0000 mg | Freq: Two times a day (BID) | INTRAVENOUS | Status: DC
  Administered 2018-04-06 – 2018-04-07 (×4): 40 mg via INTRAVENOUS
  Filled 2018-04-06 (×4): qty 40

## 2018-04-06 MED ORDER — DEXTROSE 50 % IV SOLN
INTRAVENOUS | Status: AC
  Administered 2018-04-06: 12.5 g via INTRAVENOUS
  Filled 2018-04-06: qty 50

## 2018-04-06 MED ORDER — LORAZEPAM 0.5 MG PO TABS
0.5000 mg | ORAL_TABLET | Freq: Once | ORAL | Status: AC
  Administered 2018-04-06: 0.5 mg
  Filled 2018-04-06: qty 1

## 2018-04-06 MED ORDER — HYDROMORPHONE HCL 1 MG/ML IJ SOLN
0.2000 mg | INTRAMUSCULAR | Status: DC | PRN
  Administered 2018-04-06 – 2018-04-07 (×2): 0.2 mg via INTRAVENOUS
  Filled 2018-04-06 (×2): qty 0.5

## 2018-04-06 MED ORDER — MUPIROCIN CALCIUM 2 % EX CREA
TOPICAL_CREAM | Freq: Two times a day (BID) | CUTANEOUS | Status: DC
  Filled 2018-04-06: qty 15

## 2018-04-06 MED ORDER — SODIUM CHLORIDE 0.9 % IV BOLUS
1000.0000 mL | Freq: Once | INTRAVENOUS | Status: AC
  Administered 2018-04-06: 1000 mL via INTRAVENOUS

## 2018-04-06 MED ORDER — LORAZEPAM 2 MG/ML IJ SOLN
0.2500 mg | Freq: Two times a day (BID) | INTRAMUSCULAR | Status: DC | PRN
  Administered 2018-04-06 (×2): 0.25 mg via INTRAVENOUS
  Filled 2018-04-06 (×2): qty 1

## 2018-04-06 MED ORDER — LACTATED RINGERS IV BOLUS: 1000.0000 mL | Freq: Once | INTRAVENOUS | Status: DC

## 2018-04-06 MED ORDER — SODIUM CHLORIDE 0.9% IV SOLUTION
Freq: Once | INTRAVENOUS | Status: AC
  Administered 2018-04-06: 11:00:00 via INTRAVENOUS

## 2018-04-06 MED ORDER — BACLOFEN 20 MG PO TABS
20.0000 mg | ORAL_TABLET | Freq: Three times a day (TID) | ORAL | Status: DC
  Administered 2018-04-06 – 2018-04-11 (×17): 20 mg via ORAL
  Filled 2018-04-06 (×18): qty 1

## 2018-04-06 MED ORDER — MUPIROCIN 2 % EX OINT
1.0000 "application " | TOPICAL_OINTMENT | Freq: Two times a day (BID) | CUTANEOUS | Status: AC
  Administered 2018-04-06 – 2018-04-10 (×10): 1 via NASAL
  Filled 2018-04-06: qty 22

## 2018-04-06 NOTE — Progress Notes (Signed)
[place] Progress Note Patient Name: Angela Page DOB: Nov 05, 1970 MRN: [id number]   Date of Service  04/06/2018  HPI/Events of Note  Hypotension - BP = 86/58 with MAP = 67.   eICU Interventions  Will order: 1. Bolus with 0.9 NaCl 1 liter IV over 1 hour now.      Intervention Category Major Interventions: Hypotension - evaluation and management  Sommer,Steven Eugene 04/06/2018, 4:55 AM

## 2018-04-06 NOTE — Progress Notes (Signed)
NAME:  Angela Page, MRN:  [id number], DOB:  10-06-1970, LOS: 1 ADMISSION DATE:  04/05/2018, CONSULTATION DATE:  04/05/18 REFERRING MD:  Florene Glen, MD CHIEF COMPLAINT:  Lethargy and respiratory  Brief History    48 year old female with quadriplegia, chronic T and P. She was admitted 3/16 with sepsis felt to be due to UTI vs PNA. PCCM consulted for confusion and increased WOB status. Admitted to ICU. Trach exchanged for cuffed trach and placed on ventilator.  Past Medical History  Quadriplegia, chronic trach, s/p PEG   Significant Hospital Events   3/16 Admitted  3/16 Transferred to ICU  Consults:  PCCM  Procedures:  3/16 Trach exchange  Significant Diagnostic Tests:  CXR 3/17 Right basilar infiltrate  Micro Data:  BCX 3/16 >  RCX 3/16>  Antimicrobials:  Vanc Cefepime  Interim history/subjective:  Awake, alert and in general pain. On mechanical ventilation.   Objective   Blood pressure 126/76, pulse 98, temperature 98.2 F (36.8 C), temperature source Oral, resp. rate (!) 24, height 5\' 2"  (1.575 m), weight 44.9 kg, last menstrual period 05/27/2016, SpO2 100 %.    Vent Mode: PSV;CPAP FiO2 (%):  [28 %-40 %] 40 % Set Rate:  [16 bmp] 16 bmp Vt Set:  [400 mL] 400 mL PEEP:  [5 cmH20] 5 cmH20 Pressure Support:  [8 cmH20] 8 cmH20 Plateau Pressure:  [12 cmH20-23 cmH20] 21 cmH20   Intake/Output Summary (Last 24 hours) at 04/06/2018 0808 Last data filed at 04/06/2018 0700 Gross per 24 hour  Intake 3480.27 ml  Output 1150 ml  Net 2330.27 ml   Filed Weights   04/05/18 0551 04/06/18 0500  Weight: 45.7 kg 44.9 kg   Physical Exam: General: Chronically ill appearing female, awake, in discomfort, on mechanical ventilation HENT: [place], AT, OP clear, MMM Neck: Trach midline, dressing c/d/i Eyes: EOMI, no scleral icterus Respiratory: Clear to auscultation bilaterally.  No crackles, wheezing or rales Cardiovascular: RRR, -M/R/G, no JVD GI: BS+, soft,  nontender Extremities:-Edema,-tenderness Neuro: AAO x4, CNII-XII grossly intact Skin: Intact, no rashes or bruising Psych: Normal mood, normal affect GU: Suprapubic foley in place  Resolved Hospital Problem list   Acute encephalopathy  Assessment & Plan:   Shock Patient with baseline hypotension requiring midodrine. Normal SBP in 90s. Differential includes sepsis, hemorrhagic or hypovolemic. Trach aspirate with few GPCs Plan: IVF bolus PRBCs as noted below Broad spectrum antibiotics Follow-up cultures Continue midodrine  Acute hypoxemic respiratory failure Trach dependent Influenza negative. Tolerating pressure support this morning Plan: SBT as tolerated VAP Broad spectrum antibiotics Scheduled Duonebs   Acute blood loss anemia Hg dropped from 11->6. No evidence of bleeding Plan: PPI BID Transfuse 1 U PRBC Post-transfusion CBC  Serial CBC q6h PT/INR  Chronic Pain Minimize pain medications due to hypotension  Best practice:  Diet: NPO, Hold TF Pain/Anxiety/Delirium protocol (if indicated): Fentanyl, Versed PRN VAP protocol (if indicated): Yes DVT prophylaxis: Hold in setting of potential bleed GI prophylaxis: PPI BID Glucose control: CBG, Lantus Mobility: BR Code Status: Full Family Communication: Patient updated. No family at bedside Disposition: ICU  Labs   CBC: Recent Labs  Lab 04/05/18 0145 04/05/18 1245 04/06/18 0620  WBC 31.4*  --  29.0*  NEUTROABS 29.3*  --  25.4*  HGB 11.1* 15.6* 6.2*  HCT 37.5 46.0 21.7*  MCV 87.4  --  93.5  PLT 558*  --  354    Basic Metabolic Panel: Recent Labs  Lab 04/05/18 0145 04/05/18 1245 04/06/18 0247  NA 133* 143  142  K 4.1 2.6* 4.0  CL 93*  --  113*  CO2 22  --  21*  GLUCOSE 380*  --  83  BUN 20  --  20  CREATININE 0.90  --  0.61  CALCIUM 9.7  --  8.6*  MG  --   --  1.9   GFR: Estimated Creatinine Clearance: 61.6 mL/min (by C-G formula based on SCr of 0.61 mg/dL). Recent Labs  Lab 04/05/18 0145  04/05/18 0416 04/05/18 0759 04/05/18 1105 04/05/18 1245 04/06/18 0620  WBC 31.4*  --   --   --   --  29.0*  LATICACIDVEN 3.6* 1.4 4.4* 3.5* 3.2*  --     Liver Function Tests: Recent Labs  Lab 04/05/18 0145  AST 25  ALT 18  ALKPHOS 162*  BILITOT 0.5  PROT 8.3*  ALBUMIN 3.2*   No results for input(s): LIPASE, AMYLASE in the last 168 hours. No results for input(s): AMMONIA in the last 168 hours.  ABG    Component Value Date/Time   PHART 7.434 04/05/2018 1245   PCO2ART 28.2 (L) 04/05/2018 1245   PO2ART 107.0 04/05/2018 1245   HCO3 19.0 (L) 04/05/2018 1245   TCO2 20 (L) 04/05/2018 1245   ACIDBASEDEF 4.0 (H) 04/05/2018 1245   O2SAT 99.0 04/05/2018 1245     Coagulation Profile: No results for input(s): INR, PROTIME in the last 168 hours.  Cardiac Enzymes: No results for input(s): CKTOTAL, CKMB, CKMBINDEX, TROPONINI in the last 168 hours.  HbA1C: Hgb A1c MFr Bld  Date/Time Value Ref Range Status  01/02/2018 08:02 AM 8.5 (H) 4.8 - 5.6 % Final    Comment:    (NOTE) Pre diabetes:          5.7%-6.4% Diabetes:              >6.4% Glycemic control for   <7.0% adults with diabetes   12/17/2017 03:53 AM 11.5 (H) 4.8 - 5.6 % Final    Comment:    (NOTE) Pre diabetes:          5.7%-6.4% Diabetes:              >6.4% Glycemic control for   <7.0% adults with diabetes     CBG: Recent Labs  Lab 04/05/18 1954 04/05/18 2336 04/06/18 0334 04/06/18 0359 04/06/18 0755  GLUCAP 109* 127* 69* 127* 128*    Past Medical History  She,  has a past medical history of Acute on chronic respiratory failure with hypoxia ([place]), Anasarca (06/10/2016), Asthma, At high risk for severe sepsis, Cocaine use (10/08/2013), Decubitus ulcer of buttock, unstageable ([place]), Depression, Diabetes mellitus without complication ([place]), GERD (gastroesophageal reflux disease), HCAP (healthcare-associated pneumonia) (06/09/2016), Hepatitis, Kidney stone, Lobar pneumonia ([place]), Overdose of opiate or related  narcotic ([place]) (09/02/2014), Pacemaker, Pleurisy, Polysubstance dependence including opioid type drug, episodic abuse ([place]) (10/27/2013), Protein calorie malnutrition ([place]) (10/31/2015), Quadriparesis ([place]), Quadriplegia and quadriparesis ([place]), Quadriplegia, C5-C7 incomplete ([place]) (10/08/2013), Stage IV pressure ulcer of sacral region ([place]) (10/31/2015), and Tracheostomy status ([place]).   Surgical History    Past Surgical History:  Procedure Laterality Date   APPENDECTOMY     BACK SURGERY  10/08/2013   fusion of spine, cervical region   CARDIAC SURGERY     CYSTOSCOPY W/ URETERAL STENT PLACEMENT Right 03/09/2017   Procedure: CYSTOSCOPY [name] STENT PLACEMENT;  Surgeon: Irine Seal, MD;  Location: [facility];  Service: Urology;  Laterality: Right;   CYSTOSCOPY W/ URETERAL STENT REMOVAL Right 07/16/2017   Procedure: CYSTOSCOPY WITH  STENT REMOVAL;  Surgeon: Franchot Gallo, MD;  Location: [facility];  Service: Urology;  Laterality: Right;   CYSTOSCOPY/URETEROSCOPY/HOLMIUM LASER/STENT PLACEMENT Right 07/16/2017   Procedure: CYSTOSCOPY/URETEROSCOPY/HOLMIUM LASER/STENT PLACEMENT/ALSO STONE EXTRACTION;  Surgeon: Franchot Gallo, MD;  Location: [facility];  Service: Urology;  Laterality: Right;   GASTROSTOMY  11/23/2015   I&D EXTREMITY  10/28/2011   Procedure: IRRIGATION AND DEBRIDEMENT EXTREMITY;  Surgeon: Tennis Must, MD;  Location: [facility];  Service: Orthopedics;  Laterality: Right;  Irrigation and debridement right middle finger   INSERTION OF SUPRAPUBIC CATHETER N/A 07/28/2016   Procedure: INSERTION OF SUPRAPUBIC CATHETER;  Surgeon: Franchot Gallo, MD;  Location: [facility];  Service: Urology;  Laterality: N/A;   IR GENERIC HISTORICAL  11/23/2015   IR GASTROSTOMY TUBE MOD SED 11/23/2015 [facility]   IR PATIENT EVAL TECH 0-60 MINS  12/18/2017   IR REPLACE G-TUBE SIMPLE WO FLUORO  12/19/2017   LITHOTRIPSY  08/2017   TRACHEOSTOMY       Social History   reports that she  has been smoking cigarettes. She has a 25.00 pack-year smoking history. She has never used smokeless tobacco. She reports that she does not drink alcohol or use drugs.   Family History   Her family history includes Colon cancer in her maternal grandfather and mother; Hypertension in her brother and maternal uncle; Kidney failure in her maternal aunt, maternal uncle, and maternal uncle; Lung cancer in her father. There is no history of Esophageal cancer.   Allergies Allergies  Allergen Reactions   Erythromycin Nausea And Vomiting   Nitrofurantoin Monohyd Macro Nausea And Vomiting   Oxybutynin     Dries mouth out    Penicillins Hives    Has patient had a PCN reaction causing immediate rash, facial/tongue/throat swelling, SOB or lightheadedness with hypotension: Yes Has patient had a PCN reaction causing severe rash involving mucus membranes or skin necrosis: Yes Has patient had a PCN reaction that required hospitalization No Has patient had a PCN reaction occurring within the last 10 years: No-childhood allergy If all of the above answers are "NO", then may proceed with Cephalosporin  Tolerated cephalosporins in past     Home Medications  Prior to Admission medications   Medication Sig Start Date End Date Taking? Authorizing Provider  acetaminophen (TYLENOL) 500 MG tablet Take 1,000 mg by mouth every 6 (six) hours as needed for mild pain, moderate pain or headache (Fever >100.4).   Yes [provider]  albuterol (PROVENTIL HFA;VENTOLIN HFA) 108 (90 Base) MCG/ACT inhaler Inhale 1-2 puffs into the lungs every 6 (six) hours as needed for wheezing or shortness of breath.   Yes [provider]  Amino Acids-Protein Hydrolys (FEEDING SUPPLEMENT, PRO-STAT SUGAR FREE 64,) LIQD Place 30 mLs into feeding tube 2 (two) times daily. Patient taking differently: Take 30 mLs by mouth 2 (two) times daily.  02/17/18  Yes Danford, Suann Larry, MD  baclofen (LIORESAL) 20 MG tablet Take 1  tablet (20 mg total) by mouth every 8 (eight) hours as needed for muscle spasms. 02/17/18  Yes Danford, Suann Larry, MD  DULoxetine (CYMBALTA) 30 MG capsule Take 1 capsule (30 mg total) by mouth 2 (two) times daily. 02/17/18  Yes Danford, Suann Larry, MD  famotidine (PEPCID) 40 MG/5ML suspension Place 2.5 mLs (20 mg total) into feeding tube at bedtime. Patient taking differently: Take 20 mg by mouth at bedtime.  12/31/17  Yes Arrien, Jimmy Picket, MD  furosemide (LASIX) 40 MG tablet Place 1 tablet (40 mg  total) into feeding tube daily. Patient taking differently: Take 40 mg by mouth daily.  01/01/18 04/05/18 Yes Arrien, Jimmy Picket, MD  gabapentin (NEURONTIN) 100 MG capsule Take 1 capsule (100 mg total) by mouth 3 (three) times daily. Patient taking differently: Take 300 mg by mouth 3 (three) times daily.  02/17/18  Yes Danford, Suann Larry, MD  guaiFENesin (ROBITUSSIN) 100 MG/5ML liquid Take 300 mg by mouth every 6 (six) hours as needed for cough.    Yes [provider]  Heparin Sodium, Porcine, PF 5000 UNIT/ML SOLN Inject 5,000 Units as directed every 8 (eight) hours.    Yes [provider]  insulin aspart (NOVOLOG) 100 UNIT/ML injection Inject 10 Units into the skin every 4 (four) hours. For glucose 150 to 200 use 1 units, for glucose 201-250 use 2 units, for 251 to 300 use 4 units, for 301 to 350 use 6 units, for 351 to 400 use 8 units, for 401 or greater use 10 units. Patient taking differently: Inject 0-10 Units into the skin See admin instructions. Check FSBS every 6 hours. Give insulin according to sliding scale: If BS is 0-150 = 0u, 151-200 = 2u, 201-250 = 4u, 251-300 = 6u, 301-350 = 8u, 351-400 = 10u. >400 call MD 12/31/17  Yes Arrien, Jimmy Picket, MD  Insulin Glargine (BASAGLAR KWIKPEN) 100 UNIT/ML SOPN Inject 10 Units into the skin at bedtime.   Yes [provider]  ipratropium-albuterol (DUONEB) 0.5-2.5 (3) MG/3ML SOLN Take 3 mLs by nebulization every  6 (six) hours as needed (SOB).   Yes [provider]  LORazepam (ATIVAN) 0.5 MG tablet Take 1 tablet (0.5 mg total) by mouth every 8 (eight) hours as needed for anxiety. Patient taking differently: Take 0.5 mg by mouth every 8 (eight) hours.  02/18/18  Yes Danford, Suann Larry, MD  Melatonin 3 MG TABS Take 3 mg by mouth at bedtime.    Yes [provider]  metoCLOPramide (REGLAN) 5 MG tablet Take 5 mg by mouth 4 (four) times daily. Via G-tube    Yes [provider]  midodrine (PROAMATINE) 10 MG tablet Take 10 mg by mouth 3 (three) times daily.   Yes [provider]  Nutritional Supplements (FEEDING SUPPLEMENT, GLUCERNA 1.2 CAL,) LIQD Place 1,000 mLs into feeding tube daily. 02/18/18  Yes Danford, Suann Larry, MD  ondansetron (ZOFRAN) 4 MG tablet Take 4 mg by mouth every 6 (six) hours as needed for nausea or vomiting.   Yes [provider]  oxyCODONE (OXY IR/ROXICODONE) 5 MG immediate release tablet Place 1 tablet (5 mg total) into feeding tube every 4 (four) hours as needed (breakthrough pain). Patient taking differently: Take 5 mg by mouth every 4 (four) hours as needed (breakthrough pain).  02/18/18  Yes Danford, Suann Larry, MD  OXYGEN Inhale 3 L into the lungs continuous.   Yes [provider]  polyethylene glycol (MIRALAX / GLYCOLAX) packet Take 17 g by mouth daily. Hold if diarrhea. 01/01/18  Yes Arrien, Jimmy Picket, MD  senna-docusate (SENOKOT-S) 8.6-50 MG tablet Take 2 tablets by mouth at bedtime as needed for mild constipation.   Yes [provider]  sodium chloride (OCEAN) 0.65 % SOLN nasal spray Place 2 sprays into both nostrils daily as needed for congestion.   Yes [provider]  SODIUM CHLORIDE PO Take 500 mg by mouth 2 (two) times daily.   Yes [provider]  temazepam (RESTORIL) 7.5 MG capsule Take 1 capsule (7.5 mg total) by mouth at bedtime  as needed for sleep. Patient taking differently: Take  7.5 mg by mouth at bedtime.  02/18/18  Yes Danford, Suann Larry, MD  tiotropium (SPIRIVA) 18 MCG inhalation capsule Place 18 mcg into inhaler and inhale daily.   Yes [provider]  collagenase (SANTYL) ointment Apply topically daily. Patient not taking: Reported on 04/05/2018 02/18/18   Edwin Dada, MD  diphenhydrAMINE (BENADRYL) 50 MG tablet Take 1 tablet (50 mg total) by mouth at bedtime as needed for itching. Patient not taking: Reported on 04/05/2018 02/18/18   Edwin Dada, MD  insulin detemir (LEVEMIR) 100 UNIT/ML injection Inject 0.1 mLs (10 Units total) into the skin at bedtime. Patient not taking: Reported on 04/05/2018 02/18/18   Edwin Dada, MD  Melatonin 3 MG CAPS Take 1 capsule (3 mg total) by mouth at bedtime as needed (For sleep). Patient not taking: Reported on 04/05/2018 02/18/18   Edwin Dada, MD  umeclidinium bromide (INCRUSE ELLIPTA) 62.5 MCG/INH AEPB Inhale 1 puff into the lungs daily. Patient not taking: Reported on 04/05/2018 02/18/18   Edwin Dada, MD     Critical care time: 45 min  The patient is critically ill with multiple organ systems failure and requires high complexity decision making for assessment and support, frequent evaluation and titration of therapies, application of advanced monitoring technologies and extensive interpretation of multiple databases.   Rodman Pickle, M.D. [facility] Pulmonary/Critical Care Medicine Pager: [phone] After hours pager: [phone]

## 2018-04-06 NOTE — Progress Notes (Signed)
  Echocardiogram 2D Echocardiogram has been performed.  Angela Page 04/06/2018, 2:55 PM

## 2018-04-06 NOTE — Progress Notes (Signed)
Called Elink regarding pt BP 80's/50's. RN relaying to MD, awaiting orders.

## 2018-04-06 NOTE — Progress Notes (Signed)
eLink Physician-Brief Progress Note Patient Name: Angela Page DOB: [birth year] MRN: [id number]   Date of Service  04/06/2018  HPI/Events of Note  Patient requests home Baclofen.   eICU Interventions  Will order: 1. Baclofen 20 mg per tube Q 8 hours.      Intervention Category Major Interventions: Other:  Sommer,Steven Cornelia Copa 04/06/2018, 12:02 AM

## 2018-04-06 NOTE — Progress Notes (Signed)
[facility] Progress Note Patient Name: Angela Page DOB: [birth year] MRN: [id number]   Date of Service  04/06/2018  HPI/Events of Note  Anxiety  eICU Interventions  Will order: 1. Ativan 0.5 mg per tube X 1 now.      Intervention Category Major Interventions: Delirium, psychosis, severe agitation - evaluation and management  Sommer,Steven Eugene 04/06/2018, 1:35 AM

## 2018-04-06 NOTE — Progress Notes (Signed)
If pt MAP <65 post blood transfusion, give 500 cc bolus of LR per Dr. Loanne Drilling.

## 2018-04-06 NOTE — Progress Notes (Signed)
Hypoglycemic Event  CBG: 69  Treatment: D50 25 mL (12.5 gm)   Symptoms: Pale  Follow-up CBG: [id number] CBG Result:129  Possible Reasons for Event: Inadequate meal intake  Comments/MD notified: TF off.    Angela Page

## 2018-04-07 ENCOUNTER — Other Ambulatory Visit: Payer: Self-pay

## 2018-04-07 DIAGNOSIS — A498 Other bacterial infections of unspecified site: Secondary | ICD-10-CM

## 2018-04-07 DIAGNOSIS — J15212 Pneumonia due to Methicillin resistant Staphylococcus aureus: Secondary | ICD-10-CM

## 2018-04-07 LAB — CBC
HCT: 28.4 % — ABNORMAL LOW (ref 36.0–46.0)
HCT: 27.6 % — ABNORMAL LOW (ref 36.0–46.0)
Hemoglobin: 8.7 g/dL — ABNORMAL LOW (ref 12.0–15.0)
Hemoglobin: 8.4 g/dL — ABNORMAL LOW (ref 12.0–15.0)
MCH: 27.5 pg (ref 26.0–34.0)
MCH: 27.3 pg (ref 26.0–34.0)
MCHC: 30.6 g/dL (ref 30.0–36.0)
MCHC: 30.4 g/dL (ref 30.0–36.0)
MCV: 89.9 fL (ref 80.0–100.0)
MCV: 89.6 fL (ref 80.0–100.0)
NRBC: 0 % (ref 0.0–0.2)
Platelets: 302 10*3/uL (ref 150–400)
Platelets: 301 10*3/uL (ref 150–400)
RBC: 3.16 MIL/uL — ABNORMAL LOW (ref 3.87–5.11)
RBC: 3.08 MIL/uL — ABNORMAL LOW (ref 3.87–5.11)
RDW: 19.4 % — ABNORMAL HIGH (ref 11.5–15.5)
RDW: 19.6 % — ABNORMAL HIGH (ref 11.5–15.5)
WBC: 19.5 10*3/uL — ABNORMAL HIGH (ref 4.0–10.5)
WBC: 13.6 10*3/uL — ABNORMAL HIGH (ref 4.0–10.5)
nRBC: 0 % (ref 0.0–0.2)

## 2018-04-07 LAB — CULTURE, RESPIRATORY W GRAM STAIN

## 2018-04-07 LAB — BASIC METABOLIC PANEL
Anion gap: 8 (ref 5–15)
BUN: 15 mg/dL (ref 6–20)
CO2: 20 mmol/L — AB (ref 22–32)
Calcium: 8.6 mg/dL — ABNORMAL LOW (ref 8.9–10.3)
Chloride: 113 mmol/L — ABNORMAL HIGH (ref 98–111)
Creatinine, Ser: 0.52 mg/dL (ref 0.44–1.00)
GFR calc Af Amer: >60 mL/min (ref >60)
GLUCOSE: 101 mg/dL — AB (ref 70–99)
Potassium: 3.7 mmol/L (ref 3.5–5.1)
Sodium: 141 mmol/L (ref 135–145)

## 2018-04-07 LAB — TYPE AND SCREEN
ABO/RH(D): A POS
ANTIBODY SCREEN: NEGATIVE
Unit division: 0

## 2018-04-07 LAB — GLUCOSE, CAPILLARY
Glucose-Capillary: 105 mg/dL — ABNORMAL HIGH (ref 70–99)
Glucose-Capillary: 140 mg/dL — ABNORMAL HIGH (ref 70–99)
Glucose-Capillary: 154 mg/dL — ABNORMAL HIGH (ref 70–99)
Glucose-Capillary: 172 mg/dL — ABNORMAL HIGH (ref 70–99)
Glucose-Capillary: 221 mg/dL — ABNORMAL HIGH (ref 70–99)
Glucose-Capillary: 89 mg/dL (ref 70–99)
Glucose-Capillary: 99 mg/dL (ref 70–99)

## 2018-04-07 LAB — BPAM RBC
BLOOD PRODUCT EXPIRATION DATE: 202004092359
ISSUE DATE / TIME: 202003171055
UNIT TYPE AND RH: 6200

## 2018-04-07 LAB — URINE CULTURE: Culture: >=100000 — AB

## 2018-04-07 LAB — VANCOMYCIN, TROUGH: Vancomycin Tr: 14 ug/mL — ABNORMAL LOW (ref 15–20)

## 2018-04-07 MED ORDER — LORAZEPAM 0.5 MG PO TABS
0.5000 mg | ORAL_TABLET | Freq: Two times a day (BID) | ORAL | Status: DC | PRN
  Administered 2018-04-07 – 2018-04-10 (×6): 0.5 mg via ORAL
  Filled 2018-04-07 (×6): qty 1

## 2018-04-07 MED ORDER — OXYCODONE HCL 5 MG PO TABS
5.0000 mg | ORAL_TABLET | ORAL | Status: DC | PRN
  Administered 2018-04-07 – 2018-04-11 (×19): 5 mg via ORAL
  Filled 2018-04-07 (×19): qty 1

## 2018-04-07 NOTE — Progress Notes (Signed)
Pharmacy Antibiotic Note  Angela Page is a 48 y.o. female admitted from [facility] on 04/05/2018 with pneumonia. Pt with hospitalization in January for complicated UTI. Pt is quadriplegic with indwelling foley and trach.  Pharmacy has been consulted for vanc/cefepime dosing. Pt with allergy (hives) to PCN but pt has tolerated cephalosporins in the past.  Noted she is quadriplegic, which would result in an overestimation of renal clearance. SCr up to 0.9 (usual baseline 1 mos ago ~0.45) so this likely indicates some degree of renal impairment - now down to 0.52.  Vancomycin trough approximately 25 hours after last dose came back at 14, prior to 3rd dose of vancomycin. Anticipate true trough to be within range of 15-20, even with improvement in Scr. WBC decreased from 31.4 to 13.6 today, afebrile. Trach aspirate came back with MRSA today. UCx >100 proteus (R cipro/nitrofurantoin, I imipenem).   Plan: Continue cefepime 1gm IV q24h Continue Vancomycin 1gm IV q24hrs  Will f/u renal function, micro data, and pt's clinical condition Monitor vancomycin levels as appropriate   Height: 5\' 2"  (157.5 cm) Weight: 91 lb 4.3 oz (41.4 kg) IBW/kg (Calculated) : 50.1  Temp (24hrs), Avg:98 F (36.7 C), Min:96.8 F (36 C), Max:98.8 F (37.1 C)  Recent Labs  Lab 04/05/18 0145 04/05/18 0416 04/05/18 0759 04/05/18 1105 04/05/18 1245 04/06/18 0247 04/06/18 0620 04/06/18 1516 04/06/18 2020 04/07/18 0245 04/07/18 0615 04/07/18 0906  WBC 31.4*  --   --   --   --   --  29.0* 28.8* 26.4* 19.5*  --  13.6*  CREATININE 0.90  --   --   --   --  0.61  --   --   --  0.52  --   --   LATICACIDVEN 3.6* 1.4 4.4* 3.5* 3.2*  --   --   --   --   --   --   --   VANCOTROUGH  --   --   --   --   --   --   --   --   --   --  14*  --     Estimated Creatinine Clearance: 56.8 mL/min (by C-G formula based on SCr of 0.52 mg/dL).    Allergies  Allergen Reactions  . Penicillins Hives    Has patient had a PCN  reaction causing immediate rash, facial/tongue/throat swelling, SOB or lightheadedness with hypotension: Yes Has patient had a PCN reaction causing severe rash involving mucus membranes or skin necrosis: Yes Has patient had a PCN reaction that required hospitalization No Has patient had a PCN reaction occurring within the last 10 years: No-childhood allergy If all of the above answers are "NO", then may proceed with Cephalosporin  Tolerated cephalosporins in past  . Erythromycin Nausea And Vomiting  . Nitrofurantoin Monohyd Macro Nausea And Vomiting  . Oxybutynin     Dries mouth out     Antimicrobials this admission: Azithromycin 3/16 x1 Fluconazole 3/16>> Cefepime 3/16>>  Vancomycin 3/16>>  Microbiology results: 3/16 TA: few GPCs >> MRSA 3/16 MRSA: positive 3/16 BCx: ngtd 3/16 UCx: >100 proteus mirabilis (R cipro/nitrofurantoin, I imipenem)  Thank you for allowing pharmacy to be a part of this patient's care.  Antonietta Jewel, PharmD, BCCCP Clinical Pharmacist  Pager: [phone] Phone: [phone] **Pharmacist phone directory can now be found on [email or web address] ([email or web address]).  Listed under [place]. 04/07/2018 11:27 AM

## 2018-04-07 NOTE — Progress Notes (Signed)
Request sent to rounding physician to consider DVT prophylaxis for this patient.

## 2018-04-07 NOTE — Progress Notes (Signed)
NAME:  Angela Page, MRN:  [id number], DOB:  10-Jul-1970, LOS: 2 ADMISSION DATE:  04/05/2018, CONSULTATION DATE:  04/05/18 REFERRING MD:  Florene Glen, MD CHIEF COMPLAINT:  Lethargy and respiratory  Brief History    48 year old female with quadriplegia, chronic T and P. She was admitted 3/16 with sepsis felt to be due to UTI vs PNA. PCCM consulted for confusion and increased WOB status. Admitted to ICU. Trach exchanged for cuffed trach and placed on ventilator.  Past Medical History  Quadriplegia, chronic trach, s/p PEG  Significant Hospital Events   3/16 Admitted  3/16 Transferred to ICU  Consults:  PCCM  Procedures:  3/16 Trach exchange  Significant Diagnostic Tests:  CXR 3/17 Right basilar infiltrate  Micro Data:  BCX 3/16 >  RCX 3/16> MRSA Final UCX 3/15 > Proteus Mirabalis  Antimicrobials:  Vanc Cefepime  Interim history/subjective:  Given IVF bolus overnight. Reports chronic pain.  Objective   Blood pressure (!) 94/58, pulse 92, temperature 98.5 F (36.9 C), temperature source Oral, resp. rate (!) 22, height 5\' 2"  (1.575 m), weight 41.4 kg, last menstrual period 05/27/2016, SpO2 100 %.    Vent Mode: PRVC FiO2 (%):  [40 %] 40 % Set Rate:  [16 bmp] 16 bmp Vt Set:  [400 mL] 400 mL PEEP:  [5 cmH20] 5 cmH20 Pressure Support:  [8 cmH20] 8 cmH20 Plateau Pressure:  [9 cmH20-26 cmH20] 9 cmH20   Intake/Output Summary (Last 24 hours) at 04/07/2018 0810 Last data filed at 04/07/2018 0631 Gross per 24 hour  Intake 80 ml  Output 1225 ml  Net -1145 ml   Filed Weights   04/05/18 0551 04/06/18 0500 04/07/18 0400  Weight: 45.7 kg 44.9 kg 41.4 kg   Physical Exam: General: Chronically ill-appearing, UE contractures, trach in place HENT: [place], AT, trach in place Neck: Trach midline, dressing c/d/i Eyes: EOMI, no scleral icterus Respiratory: Clear to auscultation bilaterally.  No crackles, wheezing or rales Cardiovascular: RRR, -M/R/G, no JVD GI: BS+, soft, nontender  Extremities:-Edema,-tenderness Neuro: AAO x4, CNII-XII grossly intact Skin: Intact, no rashes or bruising Psych: Normal mood, normal affect GU: Suprapubic foley in place   Resolved Hospital Problem list   Acute encephalopathy  Assessment & Plan:   Septic shock secondary to MRSA pneumonia and Proteus Mirabilis UTI Patient with baseline hypotension requiring midodrine. Normal SBP in 90s.  Off pressors now Plan: Continue antibiotics Continue midodrine Consider pressor support to minimize fluid  Acute hypoxemic respiratory failure secondary to MRSA pneuonina Trach dependent Plan: Pressure support as tolerated VAP Broad spectrum antibiotics Scheduled Duonebs   Acute blood loss anemia Hg dropped from 11->6. No evidence of bleeding S/p PRBC x 1 on 3/16 Plan: PPI BID. Decrease to daily tomorrow if Hg stable Decrease CBC to daily  Chronic Pain Resume home oxycodone Continue home ativan at reduced dose  Best practice:  Diet: NPO, Hold TF Pain/Anxiety/Delirium protocol (if indicated): Fentanyl, Versed PRN VAP protocol (if indicated): Yes DVT prophylaxis: Hold in setting of potential bleed GI prophylaxis: PPI BID Glucose control: CBG, Lantus Mobility: BR Code Status: Full Family Communication: Patient updated. No family at bedside Disposition: ICU  Labs   CBC: Recent Labs  Lab 04/05/18 0145 04/05/18 1245 04/06/18 0620 04/06/18 1516 04/06/18 2020 04/07/18 0245  WBC 31.4*  --  29.0* 28.8* 26.4* 19.5*  NEUTROABS 29.3*  --  25.4*  --   --   --   HGB 11.1* 15.6* 6.2* 9.2* 9.4* 8.7*  HCT 37.5 46.0 21.7* 31.2* 31.6*  28.4*  MCV 87.4  --  93.5 91.0 89.8 89.9  PLT 558*  --  294 301 334 093    Basic Metabolic Panel: Recent Labs  Lab 04/05/18 0145 04/05/18 1245 04/06/18 0247 04/07/18 0245  NA 133* 143 142 141  K 4.1 2.6* 4.0 3.7  CL 93*  --  113* 113*  CO2 22  --  21* 20*  GLUCOSE 380*  --  83 101*  BUN 20  --  20 15  CREATININE 0.90  --  0.61 0.52  CALCIUM  9.7  --  8.6* 8.6*  MG  --   --  1.9  --    GFR: Estimated Creatinine Clearance: 56.8 mL/min (by C-G formula based on SCr of 0.52 mg/dL). Recent Labs  Lab 04/05/18 0416 04/05/18 0759 04/05/18 1105 04/05/18 1245 04/06/18 0620 04/06/18 1516 04/06/18 2020 04/07/18 0245  WBC  --   --   --   --  29.0* 28.8* 26.4* 19.5*  LATICACIDVEN 1.4 4.4* 3.5* 3.2*  --   --   --   --     Liver Function Tests: Recent Labs  Lab 04/05/18 0145  AST 25  ALT 18  ALKPHOS 162*  BILITOT 0.5  PROT 8.3*  ALBUMIN 3.2*   No results for input(s): LIPASE, AMYLASE in the last 168 hours. No results for input(s): AMMONIA in the last 168 hours.  ABG    Component Value Date/Time   PHART 7.434 04/05/2018 1245   PCO2ART 28.2 (L) 04/05/2018 1245   PO2ART 107.0 04/05/2018 1245   HCO3 19.0 (L) 04/05/2018 1245   TCO2 20 (L) 04/05/2018 1245   ACIDBASEDEF 4.0 (H) 04/05/2018 1245   O2SAT 99.0 04/05/2018 1245     Coagulation Profile: Recent Labs  Lab 04/06/18 0844  INR 1.2    Cardiac Enzymes: No results for input(s): CKTOTAL, CKMB, CKMBINDEX, TROPONINI in the last 168 hours.  HbA1C: Hgb A1c MFr Bld  Date/Time Value Ref Range Status  01/02/2018 08:02 AM 8.5 (H) 4.8 - 5.6 % Final    Comment:    (NOTE) Pre diabetes:          5.7%-6.4% Diabetes:              >6.4% Glycemic control for   <7.0% adults with diabetes   12/17/2017 03:53 AM 11.5 (H) 4.8 - 5.6 % Final    Comment:    (NOTE) Pre diabetes:          5.7%-6.4% Diabetes:              >6.4% Glycemic control for   <7.0% adults with diabetes     CBG: Recent Labs  Lab 04/06/18 1534 04/06/18 1941 04/06/18 2346 04/07/18 0438 04/07/18 0531  GLUCAP 89 89 105* 99 105*    Critical care time: 40 min  The patient is critically ill with multiple organ systems failure and requires high complexity decision making for assessment and support, frequent evaluation and titration of therapies, application of advanced monitoring technologies and  extensive interpretation of multiple databases.   Rodman Pickle, M.D. [facility] Pulmonary/Critical Care Medicine Pager: [phone] After hours pager: [phone]

## 2018-04-07 NOTE — Progress Notes (Signed)
Pt increasingly anxious this afternoon. RN spoke with pt who says "I don't understand why I'm here and why I can't talk." "I feel like my family doesn't know what's happened."  RN educated pt on why she was on the vent and called her mother to talk.  Pt was emotional but overall more reassured after the conversation with her mother.  Will continue to closely monitor pt and give reassurance when needed.

## 2018-04-08 LAB — CBC
HCT: 26.4 % — ABNORMAL LOW (ref 36.0–46.0)
Hemoglobin: 8.1 g/dL — ABNORMAL LOW (ref 12.0–15.0)
MCH: 27.4 pg (ref 26.0–34.0)
MCHC: 30.7 g/dL (ref 30.0–36.0)
MCV: 89.2 fL (ref 80.0–100.0)
Platelets: 284 10*3/uL (ref 150–400)
RBC: 2.96 MIL/uL — ABNORMAL LOW (ref 3.87–5.11)
RDW: 19.5 % — ABNORMAL HIGH (ref 11.5–15.5)
WBC: 10.4 10*3/uL (ref 4.0–10.5)
nRBC: 0 % (ref 0.0–0.2)

## 2018-04-08 LAB — BASIC METABOLIC PANEL
Anion gap: 8 (ref 5–15)
BUN: 10 mg/dL (ref 6–20)
CO2: 21 mmol/L — ABNORMAL LOW (ref 22–32)
Calcium: 8.1 mg/dL — ABNORMAL LOW (ref 8.9–10.3)
Chloride: 107 mmol/L (ref 98–111)
Creatinine, Ser: 0.52 mg/dL (ref 0.44–1.00)
GFR calc Af Amer: >60 mL/min (ref >60)
GLUCOSE: 235 mg/dL — AB (ref 70–99)
Potassium: 3.5 mmol/L (ref 3.5–5.1)
Sodium: 136 mmol/L (ref 135–145)

## 2018-04-08 LAB — GLUCOSE, CAPILLARY
Glucose-Capillary: 140 mg/dL — ABNORMAL HIGH (ref 70–99)
Glucose-Capillary: 167 mg/dL — ABNORMAL HIGH (ref 70–99)
Glucose-Capillary: 235 mg/dL — ABNORMAL HIGH (ref 70–99)
Glucose-Capillary: 191 mg/dL — ABNORMAL HIGH (ref 70–99)
Glucose-Capillary: 146 mg/dL — ABNORMAL HIGH (ref 70–99)
Glucose-Capillary: 155 mg/dL — ABNORMAL HIGH (ref 70–99)
Glucose-Capillary: 166 mg/dL — ABNORMAL HIGH (ref 70–99)

## 2018-04-08 MED ORDER — HYDROMORPHONE HCL 1 MG/ML IJ SOLN
0.5000 mg | INTRAMUSCULAR | Status: DC | PRN
  Administered 2018-04-08 – 2018-04-09 (×8): 0.5 mg via INTRAVENOUS
  Filled 2018-04-08 (×8): qty 0.5

## 2018-04-08 MED ORDER — OSMOLITE 1.2 CAL PO LIQD
1000.0000 mL | ORAL | Status: DC
  Administered 2018-04-08 – 2018-04-10 (×3): 1000 mL
  Filled 2018-04-08 (×5): qty 1000

## 2018-04-08 MED ORDER — ENOXAPARIN SODIUM 40 MG/0.4ML ~~LOC~~ SOLN
40.0000 mg | SUBCUTANEOUS | Status: DC
  Administered 2018-04-08: 40 mg via SUBCUTANEOUS
  Filled 2018-04-08: qty 0.4

## 2018-04-08 MED ORDER — INSULIN ASPART 100 UNIT/ML ~~LOC~~ SOLN
0.0000 [IU] | SUBCUTANEOUS | Status: DC
  Administered 2018-04-08 (×3): 3 [IU] via SUBCUTANEOUS
  Administered 2018-04-09: 2 [IU] via SUBCUTANEOUS
  Administered 2018-04-09 (×4): 3 [IU] via SUBCUTANEOUS
  Administered 2018-04-10: 2 [IU] via SUBCUTANEOUS
  Administered 2018-04-10 (×3): 5 [IU] via SUBCUTANEOUS
  Administered 2018-04-10 – 2018-04-11 (×7): 3 [IU] via SUBCUTANEOUS

## 2018-04-08 MED ORDER — ENOXAPARIN SODIUM 30 MG/0.3ML ~~LOC~~ SOLN
30.0000 mg | SUBCUTANEOUS | Status: DC
  Administered 2018-04-09 – 2018-04-11 (×3): 30 mg via SUBCUTANEOUS
  Filled 2018-04-08 (×3): qty 0.3

## 2018-04-08 NOTE — Progress Notes (Signed)
NAME:  Angela Page, MRN:  [id number], DOB:  Apr 10, 1970, LOS: 3 ADMISSION DATE:  04/05/2018, CONSULTATION DATE:  04/05/18 REFERRING MD:  Florene Glen, MD CHIEF COMPLAINT:  Lethargy and respiratory  Brief History    48 year old female with quadriplegia, chronic T and P. She was admitted 3/16 with sepsis felt to be due to UTI vs PNA. PCCM consulted for confusion and increased WOB status. Admitted to ICU. Trach exchanged for cuffed trach and placed on ventilator.  Past Medical History  Quadriplegia, chronic trach, s/p PEG  Significant Hospital Events   3/16 Admitted  3/16 Transferred to ICU  Consults:  PCCM  Procedures:  3/16 Trach exchange  Significant Diagnostic Tests:  CXR 3/17 Right basilar infiltrate  Micro Data:  BCX 3/16 >  RCX 3/16> MRSA Final UCX 3/15 > Proteus Mirabalis  Antimicrobials:  Vanc Cefepime  Interim history/subjective:  Reports pain this morning. Will add IV pain med for breakthrough pain. Denies shortness of breath  Objective   Blood pressure 92/60, pulse 89, temperature 98.1 F (36.7 C), temperature source Oral, resp. rate (!) 21, height 5\' 2"  (1.575 m), weight 44.8 kg, last menstrual period 05/27/2016, SpO2 100 %.    Vent Mode: PRVC FiO2 (%):  [40 %] 40 % Set Rate:  [16 bmp] 16 bmp Vt Set:  [400 mL] 400 mL PEEP:  [5 cmH20] 5 cmH20 Pressure Support:  [5 cmH20] 5 cmH20 Plateau Pressure:  [16 cmH20-19 cmH20] 19 cmH20   Intake/Output Summary (Last 24 hours) at 04/08/2018 0820 Last data filed at 04/08/2018 0500 Gross per 24 hour  Intake 573.75 ml  Output 1500 ml  Net -926.25 ml   Filed Weights   04/06/18 0500 04/07/18 0400 04/08/18 0500  Weight: 44.9 kg 41.4 kg 44.8 kg   Physical Exam: General: Chronically ill-appearing female, UE contractures, trach in place, appears uncomfortable HENT: [place], AT Neck: Trach midline, dressing c/d/i Eyes: EOMI, no scleral icterus Respiratory: Mild rhonchi bilaterally Cardiovascular: RRR, -M/R/G, no JVD GI:  BS+, soft, nontender Extremities:-Edema,-tenderness Neuro: AAO x4, CNII-XII grossly intact Skin: Intact, no rashes or bruising Psych: Normal mood, normal affect GU: Suprapubic foley in place   Resolved Hospital Problem list   Acute encephalopathy  Assessment & Plan:   Septic shock secondary to MRSA pneumonia and Proteus Mirabilis UTI Patient with baseline hypotension requiring midodrine. Normal SBP in 90s.  Off pressors now Plan: Continue Vanc and Cefepime Day 4 of 5. No plan to de-escalate due to penicillin allergy. Continue midodrine Consider pressor support to minimize fluid  Acute hypoxemic respiratory failure secondary to MRSA pneuonina Trach dependent Plan: Pressure support as tolerated Trial trach collars VAP Broad spectrum antibiotics Scheduled Duonebs   Acute blood loss anemia Hg dropped from 11->6. No evidence of bleeding. Hg stable overnight S/p PRBC x 1 on 3/16 Plan: CBC to daily  Chronic Pain Resume home oxycodone Continue home ativan at reduced dose   Best practice:  Diet: OK to resum TF Pain/Anxiety/Delirium protocol (if indicated): Fentanyl, Versed PRN VAP protocol (if indicated): Yes DVT prophylaxis: Lovenox GI prophylaxis: Pepcid Glucose control: CBG, Lantus Mobility: BR Code Status: Full Family Communication: No family at bedside Disposition: ICU  Labs   CBC: Recent Labs  Lab 04/05/18 0145  04/06/18 0620 04/06/18 1516 04/06/18 2020 04/07/18 0245 04/07/18 0906  WBC 31.4*  --  29.0* 28.8* 26.4* 19.5* 13.6*  NEUTROABS 29.3*  --  25.4*  --   --   --   --   HGB 11.1*   < >  6.2* 9.2* 9.4* 8.7* 8.4*  HCT 37.5   < > 21.7* 31.2* 31.6* 28.4* 27.6*  MCV 87.4  --  93.5 91.0 89.8 89.9 89.6  PLT 558*  --  294 301 334 302 301   < > = values in this interval not displayed.    Basic Metabolic Panel: Recent Labs  Lab 04/05/18 0145 04/05/18 1245 04/06/18 0247 04/07/18 0245  NA 133* 143 142 141  K 4.1 2.6* 4.0 3.7  CL 93*  --  113* 113*   CO2 22  --  21* 20*  GLUCOSE 380*  --  83 101*  BUN 20  --  20 15  CREATININE 0.90  --  0.61 0.52  CALCIUM 9.7  --  8.6* 8.6*  MG  --   --  1.9  --    GFR: Estimated Creatinine Clearance: 61.5 mL/min (by C-G formula based on SCr of 0.52 mg/dL). Recent Labs  Lab 04/05/18 0416 04/05/18 0759 04/05/18 1105 04/05/18 1245  04/06/18 1516 04/06/18 2020 04/07/18 0245 04/07/18 0906  WBC  --   --   --   --    < > 28.8* 26.4* 19.5* 13.6*  LATICACIDVEN 1.4 4.4* 3.5* 3.2*  --   --   --   --   --    < > = values in this interval not displayed.    Liver Function Tests: Recent Labs  Lab 04/05/18 0145  AST 25  ALT 18  ALKPHOS 162*  BILITOT 0.5  PROT 8.3*  ALBUMIN 3.2*   No results for input(s): LIPASE, AMYLASE in the last 168 hours. No results for input(s): AMMONIA in the last 168 hours.  ABG    Component Value Date/Time   PHART 7.434 04/05/2018 1245   PCO2ART 28.2 (L) 04/05/2018 1245   PO2ART 107.0 04/05/2018 1245   HCO3 19.0 (L) 04/05/2018 1245   TCO2 20 (L) 04/05/2018 1245   ACIDBASEDEF 4.0 (H) 04/05/2018 1245   O2SAT 99.0 04/05/2018 1245     Coagulation Profile: Recent Labs  Lab 04/06/18 0844  INR 1.2    Cardiac Enzymes: No results for input(s): CKTOTAL, CKMB, CKMBINDEX, TROPONINI in the last 168 hours.  HbA1C: Hgb A1c MFr Bld  Date/Time Value Ref Range Status  01/02/2018 08:02 AM 8.5 (H) 4.8 - 5.6 % Final    Comment:    (NOTE) Pre diabetes:          5.7%-6.4% Diabetes:              >6.4% Glycemic control for   <7.0% adults with diabetes   12/17/2017 03:53 AM 11.5 (H) 4.8 - 5.6 % Final    Comment:    (NOTE) Pre diabetes:          5.7%-6.4% Diabetes:              >6.4% Glycemic control for   <7.0% adults with diabetes     CBG: Recent Labs  Lab 04/07/18 1134 04/07/18 1718 04/07/18 1949 04/07/18 2332 04/08/18 0323  GLUCAP 154* 140* 172* 221* 167*    Critical care time: 35 min  The patient is critically ill with multiple organ systems  failure and requires high complexity decision making for assessment and support, frequent evaluation and titration of therapies, application of advanced monitoring technologies and extensive interpretation of multiple databases.   Rodman Pickle, M.D. [facility] Pulmonary/Critical Care Medicine Pager: [phone] After hours pager: [phone]

## 2018-04-08 NOTE — Progress Notes (Signed)
Nutrition Follow-up  DOCUMENTATION CODES:   Non-severe (moderate) malnutrition in context of chronic illness, Underweight  INTERVENTION:  Tube feeding via G-tube: - Increase Osmolite 1.2 to 50 ml/hr (1200 ml/day) - Pro-stat 30 ml daily - Free water per MD  Tube feeding regimen provides 1540 kcal, 82 grams of protein, and 984 ml of H2O (100% of needs).  NUTRITION DIAGNOSIS:   Moderate Malnutrition related to chronic illness (COPD with chronic hypoxic respiratory failure) as evidenced by mild fat depletion, moderate fat depletion, mild muscle depletion, moderate muscle depletion, percent weight loss (25.3% weight loss in 9 months).  Ongoing, being addressed via TF  GOAL:   Patient will meet greater than or equal to 90% of their needs  Met via TF  MONITOR:   Vent status, Labs, Weight trends, I & O's, TF tolerance, Skin  REASON FOR ASSESSMENT:   Consult Assessment of nutrition requirement/status, Enteral/tube feeding initiation and management  ASSESSMENT:   48 year old female who presented [facility] the ED on 3/16 from SNF with a fever. PMH of traumatic cervical spine fracture with resulting quadriplegia s/p trach, PEG, suprapubic catheter, insulin-dependent diabetes mellitus, chronic pain, sick sinus syndrome with pacer, chronic normocytic anemia, COPD with chronic hypoxic respiratory failure, chronic hypotension, and decubitus ulcers. Pt admitted with sepsis secondary to UTI.  TF held 3/17 to 3/18 d/t concern for bleed. Resumed yesterday evening. Weight stable, down 2 lbs since admission weight on 3/16.  Pt on trach collar trial at time of visit. Vent on standby.  Discussed pt with RN and during ICU rounds.  Spoke with pt at bedside. Pt denies any abdominal pain or nausea. Pt asking for water. Alerted RN who is going to provide mouth care.  Abdomen soft on RD physical exam.  Medications reviewed and include: Pepcid, SSI, Lantus 5 units daily, Miralax, IV antibiotics, tube  feeding  Labs reviewed: hemoglobin 8.1 (L) CBG's: 167, 221, 172, 140 x 24 hours  UOP: 1500 ml x 24 hours I/O's: +3.4 L since admit  Diet Order:   Diet Order            Diet NPO time specified  Diet effective now              EDUCATION NEEDS:   Not appropriate for education at this time  Skin:  Skin Assessment: Skin Integrity Issues: Stage I: medial L knee, medial R knee Stage III: L ischium, R ischium, sacrum, proximal sacrum, lumbar Unstageable: sacrum   Last BM:  04/06/18 medium type 6  Height:   Ht Readings from Last 1 Encounters:  04/05/18 '5\' 2"'$  (1.575 m)    Weight:   Wt Readings from Last 1 Encounters:  04/08/18 44.8 kg    Ideal Body Weight:  45 kg (adjusted for quadriplegia)  BMI:  Body mass index is 18.06 kg/m.  Estimated Nutritional Needs:   Kcal:  1450-1650  Protein:  75-90 grams  Fluid:  >/= 1.5 L    Angela Face, MS, RD, LDN Inpatient Clinical Dietitian Pager: [phone] Weekend/After Hours: [phone]

## 2018-04-09 LAB — BASIC METABOLIC PANEL
Anion gap: 4 — ABNORMAL LOW (ref 5–15)
BUN: 11 mg/dL (ref 6–20)
CO2: 26 mmol/L (ref 22–32)
Calcium: 8.5 mg/dL — ABNORMAL LOW (ref 8.9–10.3)
Chloride: 111 mmol/L (ref 98–111)
Creatinine, Ser: 0.44 mg/dL (ref 0.44–1.00)
GFR calc Af Amer: >60 mL/min (ref >60)
GFR calc non Af Amer: >60 mL/min (ref >60)
Glucose, Bld: 165 mg/dL — ABNORMAL HIGH (ref 70–99)
Potassium: 3.6 mmol/L (ref 3.5–5.1)
Sodium: 141 mmol/L (ref 135–145)

## 2018-04-09 LAB — GLUCOSE, CAPILLARY
Glucose-Capillary: 136 mg/dL — ABNORMAL HIGH (ref 70–99)
Glucose-Capillary: 174 mg/dL — ABNORMAL HIGH (ref 70–99)
Glucose-Capillary: 178 mg/dL — ABNORMAL HIGH (ref 70–99)
Glucose-Capillary: 105 mg/dL — ABNORMAL HIGH (ref 70–99)
Glucose-Capillary: 152 mg/dL — ABNORMAL HIGH (ref 70–99)

## 2018-04-09 LAB — CBC
HCT: 30 % — ABNORMAL LOW (ref 36.0–46.0)
Hemoglobin: 8.9 g/dL — ABNORMAL LOW (ref 12.0–15.0)
MCH: 26.5 pg (ref 26.0–34.0)
MCHC: 29.7 g/dL — ABNORMAL LOW (ref 30.0–36.0)
MCV: 89.3 fL (ref 80.0–100.0)
NRBC: 0 % (ref 0.0–0.2)
Platelets: 347 10*3/uL (ref 150–400)
RBC: 3.36 MIL/uL — ABNORMAL LOW (ref 3.87–5.11)
RDW: 19.3 % — ABNORMAL HIGH (ref 11.5–15.5)
WBC: 10.4 10*3/uL (ref 4.0–10.5)

## 2018-04-09 MED ORDER — DIPHENHYDRAMINE HCL 25 MG PO CAPS
50.0000 mg | ORAL_CAPSULE | Freq: Every evening | ORAL | Status: DC | PRN
  Administered 2018-04-09: 50 mg via ORAL
  Filled 2018-04-09: qty 2

## 2018-04-09 MED ORDER — CHLORHEXIDINE GLUCONATE 0.12 % MT SOLN
15.0000 mL | Freq: Two times a day (BID) | OROMUCOSAL | Status: DC
  Administered 2018-04-09 – 2018-04-11 (×5): 15 mL via OROMUCOSAL
  Filled 2018-04-09 (×4): qty 15

## 2018-04-09 MED ORDER — ORAL CARE MOUTH RINSE
15.0000 mL | Freq: Two times a day (BID) | OROMUCOSAL | Status: DC
  Administered 2018-04-09 – 2018-04-10 (×4): 15 mL via OROMUCOSAL

## 2018-04-09 NOTE — Progress Notes (Signed)
NAME:  Angela Page, MRN:  [id number], DOB:  12/30/1970, LOS: 4 ADMISSION DATE:  04/05/2018, CONSULTATION DATE:  04/05/18 REFERRING MD:  Florene Glen, MD CHIEF COMPLAINT:  Lethargy and respiratory  Brief History    48 year old female with quadriplegia, chronic T and P. She was admitted 3/16 with sepsis felt to be due to UTI vs PNA. PCCM consulted for confusion and increased WOB status. Admitted to ICU. Trach exchanged for cuffed trach and placed on ventilator.  Past Medical History  Quadriplegia, chronic trach, s/p PEG  Significant Hospital Events   3/16 Admitted  3/16 Transferred to ICU  Consults:  PCCM  Procedures:  3/16 Trach exchange  Significant Diagnostic Tests:  CXR 3/17 Right basilar infiltrate  Micro Data:  BCX 3/16 >  RCX 3/16> MRSA Final UCX 3/15 > Proteus Mirabalis  Antimicrobials:  Vanc 3/16->3/20 Cefepime  Interim history/subjective:  Tolerated trach collar overnight. Reports pain is well-controlled.  Objective   Blood pressure (!) 95/58, pulse 84, temperature (!) 97.5 F (36.4 C), temperature source Oral, resp. rate (!) 23, height 5\' 2"  (1.575 m), weight 45.3 kg, last menstrual period 05/27/2016, SpO2 100 %.    FiO2 (%):  [35 %-40 %] 35 %   Intake/Output Summary (Last 24 hours) at 04/09/2018 0914 Last data filed at 04/09/2018 0800 Gross per 24 hour  Intake 2207.4 ml  Output 2375 ml  Net -167.6 ml   Filed Weights   04/07/18 0400 04/08/18 0500 04/09/18 0500  Weight: 41.4 kg 44.8 kg 45.3 kg   Physical Exam: General: Chronically ill-appearing, UE contractures, trach in place, appear uncomfortable HENT: [place], AT, OP clear, MMM Neck: Trach midline, dressing c/d/i Eyes: EOMI, no scleral icterus Respiratory: Scattered rhonchi bilaterally Cardiovascular: RRR, -M/R/G, no JVD GI: BS+, soft, nontender Extremities:-Edema,-tenderness Neuro: AAOx 4, follows commands Psych: Normal mood, normal affect GU: Suprapubic foley in place  Resolved Hospital  Problem list   Acute encephalopathy Septic Shock  Assessment & Plan:   MRSA pneumonia and Proteus Mirabilis UTI Patient with baseline hypotension requiring midodrine. Normal SBP in 90s.  Off pressors now Plan: Complete Vanc and Cefepime Day 5 of 5 Continue midodrine  Trach dependent Plan: Continue trach collar Routine trach care VAP Scheduled Duonebs   Acute blood loss anemia Hg dropped from 11->6. No evidence of bleeding. Hg stable overnight S/p PRBC x 1 on 3/16 Plan: CBC daily  Chronic Pain Resume home oxycodone Continue home ativan at reduced dose  Best practice:  Diet: OK to resum TF Pain/Anxiety/Delirium protocol (if indicated): Fentanyl, Versed PRN VAP protocol (if indicated): Yes DVT prophylaxis: Lovenox GI prophylaxis: Pepcid Glucose control: CBG, Lantus Mobility: BR Code Status: Full Family Communication: No family at bedside Disposition: Remain in ICU, if stable will transfer to [facility]  Labs   CBC: Recent Labs  Lab 04/05/18 0145  04/06/18 0620  04/06/18 2020 04/07/18 0245 04/07/18 0906 04/08/18 0804 04/09/18 0347  WBC 31.4*  --  29.0*   < > 26.4* 19.5* 13.6* 10.4 10.4  NEUTROABS 29.3*  --  25.4*  --   --   --   --   --   --   HGB 11.1*   < > 6.2*   < > 9.4* 8.7* 8.4* 8.1* 8.9*  HCT 37.5   < > 21.7*   < > 31.6* 28.4* 27.6* 26.4* 30.0*  MCV 87.4  --  93.5   < > 89.8 89.9 89.6 89.2 89.3  PLT 558*  --  294   < >  334 302 301 284 347   < > = values in this interval not displayed.    Basic Metabolic Panel: Recent Labs  Lab 04/05/18 0145 04/05/18 1245 04/06/18 0247 04/07/18 0245 04/08/18 0804 04/09/18 0347  NA 133* 143 142 141 136 141  K 4.1 2.6* 4.0 3.7 3.5 3.6  CL 93*  --  113* 113* 107 111  CO2 22  --  21* 20* 21* 26  GLUCOSE 380*  --  83 101* 235* 165*  BUN 20  --  20 15 10 11   CREATININE 0.90  --  0.61 0.52 0.52 0.44  CALCIUM 9.7  --  8.6* 8.6* 8.1* 8.5*  MG  --   --  1.9  --   --   --    GFR: Estimated Creatinine Clearance: 62.2  mL/min (by C-G formula based on SCr of 0.44 mg/dL). Recent Labs  Lab 04/05/18 0416 04/05/18 0759 04/05/18 1105 04/05/18 1245  04/07/18 0245 04/07/18 0906 04/08/18 0804 04/09/18 0347  WBC  --   --   --   --    < > 19.5* 13.6* 10.4 10.4  LATICACIDVEN 1.4 4.4* 3.5* 3.2*  --   --   --   --   --    < > = values in this interval not displayed.    Liver Function Tests: Recent Labs  Lab 04/05/18 0145  AST 25  ALT 18  ALKPHOS 162*  BILITOT 0.5  PROT 8.3*  ALBUMIN 3.2*   No results for input(s): LIPASE, AMYLASE in the last 168 hours. No results for input(s): AMMONIA in the last 168 hours.  ABG    Component Value Date/Time   PHART 7.434 04/05/2018 1245   PCO2ART 28.2 (L) 04/05/2018 1245   PO2ART 107.0 04/05/2018 1245   HCO3 19.0 (L) 04/05/2018 1245   TCO2 20 (L) 04/05/2018 1245   ACIDBASEDEF 4.0 (H) 04/05/2018 1245   O2SAT 99.0 04/05/2018 1245     Coagulation Profile: Recent Labs  Lab 04/06/18 0844  INR 1.2    Cardiac Enzymes: No results for input(s): CKTOTAL, CKMB, CKMBINDEX, TROPONINI in the last 168 hours.  HbA1C: Hgb A1c MFr Bld  Date/Time Value Ref Range Status  01/02/2018 08:02 AM 8.5 (H) 4.8 - 5.6 % Final    Comment:    (NOTE) Pre diabetes:          5.7%-6.4% Diabetes:              >6.4% Glycemic control for   <7.0% adults with diabetes   12/17/2017 03:53 AM 11.5 (H) 4.8 - 5.6 % Final    Comment:    (NOTE) Pre diabetes:          5.7%-6.4% Diabetes:              >6.4% Glycemic control for   <7.0% adults with diabetes     CBG: Recent Labs  Lab 04/08/18 1719 04/08/18 1949 04/09/18 0051 04/09/18 0438 04/09/18 0743  GLUCAP 155* 166* 136* 174* 178*    Critical care time: 36 min  The patient is critically ill with multiple organ systems failure and requires high complexity decision making for assessment and support, frequent evaluation and titration of therapies, application of advanced monitoring technologies and extensive interpretation of  multiple databases.   Rodman Pickle, M.D. [facility] Pulmonary/Critical Care Medicine Pager: [phone] After hours pager: [phone]

## 2018-04-10 LAB — CULTURE, BLOOD (ROUTINE X 2)
Culture: NO GROWTH
Culture: NO GROWTH
Special Requests: ADEQUATE
Special Requests: ADEQUATE

## 2018-04-10 LAB — GLUCOSE, CAPILLARY
GLUCOSE-CAPILLARY: 219 mg/dL — AB (ref 70–99)
Glucose-Capillary: 143 mg/dL — ABNORMAL HIGH (ref 70–99)
Glucose-Capillary: 153 mg/dL — ABNORMAL HIGH (ref 70–99)
Glucose-Capillary: 157 mg/dL — ABNORMAL HIGH (ref 70–99)
Glucose-Capillary: 176 mg/dL — ABNORMAL HIGH (ref 70–99)
Glucose-Capillary: 176 mg/dL — ABNORMAL HIGH (ref 70–99)
Glucose-Capillary: 219 mg/dL — ABNORMAL HIGH (ref 70–99)
Glucose-Capillary: 225 mg/dL — ABNORMAL HIGH (ref 70–99)

## 2018-04-10 LAB — CBC
HCT: 29.4 % — ABNORMAL LOW (ref 36.0–46.0)
Hemoglobin: 9 g/dL — ABNORMAL LOW (ref 12.0–15.0)
MCH: 27 pg (ref 26.0–34.0)
MCHC: 30.6 g/dL (ref 30.0–36.0)
MCV: 88.3 fL (ref 80.0–100.0)
NRBC: 0 % (ref 0.0–0.2)
PLATELETS: 395 10*3/uL (ref 150–400)
RBC: 3.33 MIL/uL — ABNORMAL LOW (ref 3.87–5.11)
RDW: 19.1 % — ABNORMAL HIGH (ref 11.5–15.5)
WBC: 13.5 10*3/uL — AB (ref 4.0–10.5)

## 2018-04-10 LAB — BASIC METABOLIC PANEL
ANION GAP: 8 (ref 5–15)
BUN: 12 mg/dL (ref 6–20)
CO2: 23 mmol/L (ref 22–32)
Calcium: 8.5 mg/dL — ABNORMAL LOW (ref 8.9–10.3)
Chloride: 103 mmol/L (ref 98–111)
Creatinine, Ser: 0.39 mg/dL — ABNORMAL LOW (ref 0.44–1.00)
GFR calc non Af Amer: >60 mL/min (ref >60)
Glucose, Bld: 165 mg/dL — ABNORMAL HIGH (ref 70–99)
Potassium: 4 mmol/L (ref 3.5–5.1)
SODIUM: 134 mmol/L — AB (ref 135–145)

## 2018-04-10 NOTE — Progress Notes (Signed)
PROGRESS NOTE  Angela Page ZOX:[id number] DOB: [birth year] DOA: 04/05/2018 PCP: Default, Provider, MD  HPI/Recap of past 78 hours: 48 year old female with quadriplegia, chronic trach and PEG tube feeding. She was admitted 3/16 with sepsis felt to be due to UTI vs PNA. PCCM consulted for confusion and increased WOB status. Admitted to ICU. Trach exchanged for cuffed trach and placed on ventilator.  Transferred to [facility] service on 04/10/2018.  04/10/18: Patient seen and examined at her bedside.  No acute events overnight.  She has no new complaints.  She denies any chest pain, palpitation or dyspnea at rest.  Trach in place with 100% oxygen saturation.    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 [employer])   Complicated UTI (urinary tract infection)   Acute on chronic respiratory failure with hypoxia (HCC)   Quadriplegia and quadriparesis (HCC)  Sepsis secondary to MRSA pneumonia versus Proteus mirabilis UTI, poa Presented with leukocytosis, tachycardia and tachypnea Completed 5 days of IV vancomycin and cefepime Continue mupirocin ointment Monitor fever curve and WBC WBC 13 K from 10K with suspicion for hemoconcentration Continue to monitor Independently reviewed chest x-ray done on 04/06/2018 which revealed right lower lobe infiltrates and left-sided pacemaker  Chronic hypotension On midodrine at home chronically Continue midodrine Maintain map greater than 65  Trach dependence Continue trach care Continue nebs Maintain O2 saturation greater than 92%  Acute blood loss anemia, Unclear etiology We will obtain FOBT and iron studies Monitor H&H Hemoglobin 9.0 from 8.9 yesterday, suspect hemoconcentration  Chronic diastolic CHF Last 2D echo done on 04/06/2018 revealed LVEF 60 to 65% Strict I's and O's and  daily weight  Dysphagia Continue PEG tube feeding Maintain head of bed at least 30 degree angle Aspiration precautions  Post pacemaker Left-sided pacemaker noted on chest x-ray     Code Status: Full code  Family Communication: None at bedside  Disposition Plan: SNF when hemodynamically stable.   Consultants:  PCCM  Procedures:  None  Antimicrobials:  None  DVT prophylaxis: Subcu Lovenox daily   Objective: Vitals:   04/10/18 0700 04/10/18 0743 04/10/18 0800 04/10/18 0838  BP: 93/63  105/65 105/65  Pulse: 82  87 80  Resp: 17  19 (!) 21  Temp:  99.2 F (37.3 C)    TempSrc:  Oral    SpO2: 100%  100% 100%  Weight:      Height:        Intake/Output Summary (Last 24 hours) at 04/10/2018 1114 Last data filed at 04/10/2018 0700 Gross per 24 hour  Intake 1180 ml  Output 1950 ml  Net -770 ml   Filed Weights   04/08/18 0500 04/09/18 0500 04/10/18 0500  Weight: 44.8 kg 45.3 kg 45.2 kg    Exam:  . General: 48 y.o. year-old female well developed well nourished in no acute distress.  Alert and oriented x3.  Trach collar in place. . Cardiovascular: Regular rate and rhythm with no rubs or gallops.  No thyromegaly or JVD noted.   Marland Kitchen Respiratory: Clear to auscultation with no wheezes or rales. Good inspiratory effort. . Abdomen: Soft nontender nondistended with normal bowel sounds x4 quadrants. . Musculoskeletal: Trace lower extremity edema.  Flaccid limbs with contractures x4.  2/4 pulses in all 4 extremities. Marland Kitchen Psychiatry: Mood is appropriate for condition and setting   Data  Reviewed: CBC: Recent Labs  Lab 04/05/18 0145  04/06/18 0620  04/07/18 0245 04/07/18 0906 04/08/18 0804 04/09/18 0347 04/10/18 0220  WBC 31.4*  --  29.0*   < > 19.5* 13.6* 10.4 10.4 13.5*  NEUTROABS 29.3*  --  25.4*  --   --   --   --   --   --   HGB 11.1*   < > 6.2*   < > 8.7* 8.4* 8.1* 8.9* 9.0*  HCT 37.5   < > 21.7*   < > 28.4* 27.6* 26.4* 30.0* 29.4*  MCV 87.4  --  93.5   < >  89.9 89.6 89.2 89.3 88.3  PLT 558*  --  294   < > 302 301 284 347 395   < > = values in this interval not displayed.   Basic Metabolic Panel: Recent Labs  Lab 04/06/18 0247 04/07/18 0245 04/08/18 0804 04/09/18 0347 04/10/18 0220  NA 142 141 136 141 134*  K 4.0 3.7 3.5 3.6 4.0  CL 113* 113* 107 111 103  CO2 21* 20* 21* 26 23  GLUCOSE 83 101* 235* 165* 165*  BUN 20 15 10 11 12   CREATININE 0.61 0.52 0.52 0.44 0.39*  CALCIUM 8.6* 8.6* 8.1* 8.5* 8.5*  MG 1.9  --   --   --   --    GFR: Estimated Creatinine Clearance: 62 mL/min (A) (by C-G formula based on SCr of 0.39 mg/dL (L)). Liver Function Tests: Recent Labs  Lab 04/05/18 0145  AST 25  ALT 18  ALKPHOS 162*  BILITOT 0.5  PROT 8.3*  ALBUMIN 3.2*   No results for input(s): LIPASE, AMYLASE in the last 168 hours. No results for input(s): AMMONIA in the last 168 hours. Coagulation Profile: Recent Labs  Lab 04/06/18 0844  INR 1.2   Cardiac Enzymes: No results for input(s): CKTOTAL, CKMB, CKMBINDEX, TROPONINI in the last 168 hours. BNP (last 3 results) Recent Labs    04/28/17 1110  PROBNP 62.0   HbA1C: No results for input(s): HGBA1C in the last 72 hours. CBG: Recent Labs  Lab 04/09/18 1943 04/10/18 0010 04/10/18 0445 04/10/18 0620 04/10/18 0746  GLUCAP 152* 143* 176* 219* 225*   Lipid Profile: No results for input(s): CHOL, HDL, LDLCALC, TRIG, CHOLHDL, LDLDIRECT in the last 72 hours. Thyroid Function Tests: No results for input(s): TSH, T4TOTAL, FREET4, T3FREE, THYROIDAB in the last 72 hours. Anemia Panel: No results for input(s): VITAMINB12, FOLATE, FERRITIN, TIBC, IRON, RETICCTPCT in the last 72 hours. Urine analysis:    Component Value Date/Time   COLORURINE YELLOW 04/05/2018 0218   APPEARANCEUR CLOUDY (A) 04/05/2018 0218   LABSPEC 1.015 04/05/2018 0218   PHURINE 9.0 (H) 04/05/2018 0218   GLUCOSEU >=500 (A) 04/05/2018 0218   GLUCOSEU NEGATIVE 04/28/2017 1121   HGBUR NEGATIVE 04/05/2018 0218    BILIRUBINUR NEGATIVE 04/05/2018 0218   KETONESUR NEGATIVE 04/05/2018 0218   PROTEINUR 100 (A) 04/05/2018 0218   UROBILINOGEN 0.2 04/28/2017 1121   NITRITE POSITIVE (A) 04/05/2018 0218   LEUKOCYTESUR LARGE (A) 04/05/2018 0218   Sepsis Labs: @LABRCNTIP (procalcitonin:4,lacticidven:4)  ) Recent Results (from the past 240 hour(s))  Blood Culture (routine x 2)     Status: None   Collection Time: 04/05/18  1:45 AM  Result Value Ref Range Status   Specimen Description BLOOD LEFT ARM  Final   Special Requests   Final    BOTTLES DRAWN AEROBIC AND ANAEROBIC Blood Culture adequate volume   Culture   Final  NO GROWTH 5 DAYS Performed at [facility], [place] [address] [place], [place] [address]    Report Status 04/10/2018 FINAL  Final  Blood Culture (routine x 2)     Status: None   Collection Time: 04/05/18  2:15 AM  Result Value Ref Range Status   Specimen Description BLOOD LEFT FOREARM  Final   Special Requests   Final    BOTTLES DRAWN AEROBIC AND ANAEROBIC Blood Culture adequate volume   Culture   Final    NO GROWTH 5 DAYS Performed at [facility], [place] [address], [place], [place] [address]    Report Status 04/10/2018 FINAL  Final  Urine culture     Status: Abnormal   Collection Time: 04/05/18  2:18 AM  Result Value Ref Range Status   Specimen Description URINE, CATHETERIZED  Final   Special Requests   Final    NONE Performed at [facility], [place] [address], [place], [place] [address]    Culture >=100,000 COLONIES/mL PROTEUS MIRABILIS (A)  Final   Report Status 04/07/2018 FINAL  Final   Organism ID, Bacteria PROTEUS MIRABILIS (A)  Final      Susceptibility   Proteus mirabilis - MIC*    AMPICILLIN <=2 SENSITIVE Sensitive     CEFAZOLIN <=4 SENSITIVE Sensitive     CEFTRIAXONE <=1 SENSITIVE Sensitive     CIPROFLOXACIN >=4 RESISTANT Resistant     GENTAMICIN <=1 SENSITIVE Sensitive     IMIPENEM 8 INTERMEDIATE Intermediate     NITROFURANTOIN 128 RESISTANT  Resistant     TRIMETH/SULFA 40 SENSITIVE Sensitive     AMPICILLIN/SULBACTAM <=2 SENSITIVE Sensitive     PIP/TAZO <=4 SENSITIVE Sensitive     * >=100,000 COLONIES/mL PROTEUS MIRABILIS  Culture, respiratory (non-expectorated)     Status: None   Collection Time: 04/05/18 11:58 AM  Result Value Ref Range Status   Specimen Description TRACHEAL ASPIRATE  Final   Special Requests NONE  Final   Gram Stain   Final    ABUNDANT WBC PRESENT, PREDOMINANTLY PMN FEW GRAM POSITIVE COCCI Performed at [facility], [place] [address], [place], [place] [address]    Culture   Final    MODERATE METHICILLIN RESISTANT STAPHYLOCOCCUS AUREUS   Report Status 04/07/2018 FINAL  Final   Organism ID, Bacteria METHICILLIN RESISTANT STAPHYLOCOCCUS AUREUS  Final      Susceptibility   Methicillin resistant staphylococcus aureus - MIC*    CIPROFLOXACIN >=8 RESISTANT Resistant     ERYTHROMYCIN >=8 RESISTANT Resistant     GENTAMICIN <=0.5 SENSITIVE Sensitive     OXACILLIN >=4 RESISTANT Resistant     TETRACYCLINE <=1 SENSITIVE Sensitive     VANCOMYCIN 1 SENSITIVE Sensitive     TRIMETH/SULFA >=320 RESISTANT Resistant     CLINDAMYCIN <=0.25 SENSITIVE Sensitive     RIFAMPIN <=0.5 SENSITIVE Sensitive     Inducible Clindamycin NEGATIVE Sensitive     * MODERATE METHICILLIN RESISTANT STAPHYLOCOCCUS AUREUS  MRSA PCR Screening     Status: Abnormal   Collection Time: 04/05/18 12:52 PM  Result Value Ref Range Status   MRSA by PCR POSITIVE (A) NEGATIVE Final    Comment:        The GeneXpert MRSA Assay (FDA approved for NASAL specimens only), is one component of a comprehensive MRSA colonization surveillance program. It is not intended to diagnose MRSA infection nor to guide or monitor treatment for MRSA infections. RESULT CALLED TO, READ BACK BY AND VERIFIED WITH: Arlis Porta RN 14:35  04/05/18 ([email or web address]) Performed at [facility], [place] [address], [place], [place] [address]       Studies: No results found.   Scheduled Meds: . baclofen  20 mg Oral TID  . chlorhexidine  15 mL Mouth Rinse BID  . enoxaparin (LOVENOX) injection  30 mg Subcutaneous Q24H  . famotidine  20 mg Per Tube Once  . feeding supplement (PRO-STAT SUGAR FREE 64)  30 mL Per Tube Daily  . insulin aspart  0-15 Units Subcutaneous Q4H  . insulin glargine  5 Units Subcutaneous QHS  . ipratropium-albuterol  3 mL Nebulization Q6H  . mouth rinse  15 mL Mouth Rinse q12n4p  . midodrine  10 mg Per Tube TID WC  . mupirocin ointment  1 application Nasal BID  . polyethylene glycol  17 g Oral Daily  . sodium chloride flush  3 mL Intravenous Q12H    Continuous Infusions: . feeding supplement (OSMOLITE 1.2 CAL) 50 mL/hr at 04/09/18 1800  . lactated ringers       LOS: 5 days     Kayleen Memos, MD [facility] Pager [phone]  If 7PM-7AM, please contact night-coverage [email or web address] Password [email or web address] 04/10/2018, 11:14 AM

## 2018-04-11 DIAGNOSIS — Z930 Tracheostomy status: Secondary | ICD-10-CM

## 2018-04-11 LAB — CBC
HCT: 32.6 % — ABNORMAL LOW (ref 36.0–46.0)
Hemoglobin: 9.9 g/dL — ABNORMAL LOW (ref 12.0–15.0)
MCH: 26.9 pg (ref 26.0–34.0)
MCHC: 30.4 g/dL (ref 30.0–36.0)
MCV: 88.6 fL (ref 80.0–100.0)
Platelets: 434 10*3/uL — ABNORMAL HIGH (ref 150–400)
RBC: 3.68 MIL/uL — ABNORMAL LOW (ref 3.87–5.11)
RDW: 19.1 % — ABNORMAL HIGH (ref 11.5–15.5)
WBC: 12.1 10*3/uL — ABNORMAL HIGH (ref 4.0–10.5)
nRBC: 0 % (ref 0.0–0.2)

## 2018-04-11 LAB — BASIC METABOLIC PANEL
Anion gap: 10 (ref 5–15)
BUN: 13 mg/dL (ref 6–20)
CALCIUM: 9.2 mg/dL (ref 8.9–10.3)
CO2: 25 mmol/L (ref 22–32)
CREATININE: 0.38 mg/dL — AB (ref 0.44–1.00)
Chloride: 101 mmol/L (ref 98–111)
GFR calc Af Amer: >60 mL/min (ref >60)
GFR calc non Af Amer: >60 mL/min (ref >60)
Glucose, Bld: 174 mg/dL — ABNORMAL HIGH (ref 70–99)
Potassium: 4.4 mmol/L (ref 3.5–5.1)
Sodium: 136 mmol/L (ref 135–145)

## 2018-04-11 LAB — GLUCOSE, CAPILLARY
Glucose-Capillary: 168 mg/dL — ABNORMAL HIGH (ref 70–99)
Glucose-Capillary: 158 mg/dL — ABNORMAL HIGH (ref 70–99)
Glucose-Capillary: 168 mg/dL — ABNORMAL HIGH (ref 70–99)
Glucose-Capillary: 198 mg/dL — ABNORMAL HIGH (ref 70–99)

## 2018-04-11 LAB — IRON AND TIBC
Iron: 16 ug/dL — ABNORMAL LOW (ref 28–170)
Saturation Ratios: 8 % — ABNORMAL LOW (ref 10.4–31.8)
TIBC: 190 ug/dL — ABNORMAL LOW (ref 250–450)
UIBC: 174 ug/dL

## 2018-04-11 LAB — FERRITIN: Ferritin: 201 ng/mL (ref 11–307)

## 2018-04-11 MED ORDER — BASAGLAR KWIKPEN 100 UNIT/ML ~~LOC~~ SOPN: 5.0000 [IU] | PEN_INJECTOR | Freq: Every day | SUBCUTANEOUS | 0 refills | Status: AC

## 2018-04-11 MED ORDER — FERROUS SULFATE 300 (60 FE) MG/5ML PO SYRP: 220.0000 mg | ORAL_SOLUTION | Freq: Every day | ORAL | 0 refills | Status: DC

## 2018-04-11 MED ORDER — FERROUS SULFATE 300 (60 FE) MG/5ML PO SYRP
220.0000 mg | ORAL_SOLUTION | Freq: Every day | ORAL | Status: DC
  Administered 2018-04-11: 220 mg
  Filled 2018-04-11: qty 5

## 2018-04-11 NOTE — NC FL2 (Signed)
[place] LEVEL OF CARE SCREENING TOOL     IDENTIFICATION  Patient Name: Angela Page Birthdate: [birth year] Sex: female Admission Date (Current Location): 04/05/2018  [facility] and [place] Number:  Herbalist and Address:  [facility] [place], [place] [address], [place], [place] [address]      Provider Number: [id number]  Attending Physician Name and Address:  Kayleen Memos, DO  Relative Name and Phone Number:       Current Level of Care: Hospital Recommended Level of Care: [place] Prior Approval Number:    Date Approved/Denied:   PASRR Number:    Discharge Plan: SNF    Current Diagnoses: Patient Active Problem List   Diagnosis Date Noted  . Chronic respiratory failure with hypoxia ([place])   . Encephalopathy acute 02/14/2018  . Sepsis ([place]) 01/31/2018  . Acute on chronic respiratory failure with hypoxia ([place])   . Quadriplegia and quadriparesis ([place])   . Lobar pneumonia ([place])   . At high risk for severe sepsis   . Tracheostomy status ([place])   . Decubitus ulcer of buttock, unstageable ([place])   . Hyperglycemic hyperosmolar nonketotic coma ([place]) 12/16/2017  . Lactic acid acidosis 12/16/2017  . AKI (acute kidney injury) ([place]) 12/16/2017  . Suprapubic catheter ([place]) 12/16/2017  . [place] (hyperglycemic hyperosmolar nonketotic coma) ([place]) 12/16/2017  . Elevated lactic acid level   . Pressure injury of skin 07/17/2017  . Kidney stone 07/16/2017  . Right ureteral stone 03/10/2017  . Complicated UTI (urinary tract infection) 03/09/2017  . Ineffective airway clearance   . Sepsis secondary to UTI ([place]) 06/30/2016  . Autonomic neuropathy 06/10/2016  . Presence of permanent cardiac pacemaker 06/10/2016  . 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 Chronically on opiate therapy 06/10/2016  . Indwelling Foley catheter present 06/10/2016  . PEG (percutaneous endoscopic gastrostomy) status ([place]) 06/10/2016  .  Pulmonary hypertension ([place]) 06/10/2016  . Hypoalbuminemia 06/10/2016  . Abnormal transaminases   . Peripheral edema   . Tracheostomy dependence ([place])   . Chronic obstructive pulmonary disease ([place])   . Chronic diastolic CHF (congestive heart failure) ([place]) 01/23/2016  . Tracheostomy status ([place])   . Esophageal dysphagia   . Malnutrition of moderate degree 11/01/2015  . Tobacco abuse 10/31/2015  . Asthma 10/31/2015  . History of pneumonia 10/08/2015  . Pressure ulcer of contiguous region involving buttock and hip, stage 4 ([place]) 02/26/2015  . Hypotension 02/25/2015  . Acute metabolic encephalopathy 29/47/6546  . Leukocytosis 10/23/2014  . Hypokalemia 10/23/2014  . Decubitus ulcer of sacral region, stage 4 ([place]) 10/23/2014  . Anemia, iron deficiency 10/23/2014  . Quadriplegia, C5-C7 incomplete ([place]) 10/22/2014  . Depression   . Protein-calorie malnutrition, severe ([place]) 09/05/2014  . Neurogenic orthostatic hypotension ([place]) 08/04/2014  . Recurrent UTI 03/31/2014  . Pressure ulcer of coccygeal region, stage 4 ([place]) 01/06/2014  . 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 Neuropathic pain 11/30/2013  . 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 Spinal cord injury, cervical region ([place]) 11/30/2013  . Chronic pain due to injury 11/30/2013  . S/P cervical spinal fusion 10/27/2013  . Tracheostomy care ([place]) 10/27/2013  . 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 SCI (spinal cord injury) 10/11/2013    Orientation RESPIRATION BLADDER Height & Weight     Self, Time, Situation, Place  Tracheostomy(trach collar, 21%, 5L) Continent Weight: 99 lb 10.4 oz (45.2 kg) Height:  5\' 2"  (157.5 cm)  BEHAVIORAL SYMPTOMS/MOOD  NEUROLOGICAL BOWEL NUTRITION STATUS      Incontinent Diet(tube feed via G-tube: -Increase Osmolite 1.2to 26ml/hr (1240ml/day),Pro-stat 30 ml daily, Free water per MD)  AMBULATORY STATUS COMMUNICATION OF NEEDS Skin   Extensive Assist Verbally  PU Stage and Appropriate Care(Unstageable PU on sacrum, Abdominal binder;Gauze;Moist to dry, change daily.) PU Stage 1 Dressing: (knee, foam dressing, change every 3 days.)   PU Stage 3 Dressing: (Ischial tuberosity, moist to moist; guaze; ABD dressing, change daily. Ischial tuberosity, Abdominal binder;Gauze ;Moist to dry, change daily. Lumbar, Abdominal binder;Gauze ;Moist to dry, change daily.)                 Personal Care Assistance Level of Assistance  Dressing, Feeding, Bathing Bathing Assistance: Maximum assistance Feeding assistance: Limited assistance Dressing Assistance: Maximum assistance     Functional Limitations Info  Sight, Hearing, Speech Sight Info: Adequate Hearing Info: Adequate Speech Info: Adequate    SPECIAL CARE FACTORS FREQUENCY  PT (By licensed PT), OT (By licensed OT)                    Contractures Contractures Info: Not present    Additional Factors Info  Code Status, Allergies, Isolation Precautions Code Status Info: Full Code Allergies Info: Penicillins, Erythromycin, Nitrofurantoin Monohyd Macro, Oxybutynin     Isolation Precautions Info: Contact Precaution: OMDRO, MRSA     Current Medications (04/11/2018):  This is the current hospital active medication list Current Facility-Administered Medications  Medication Dose Route Frequency Provider Last Rate Last Dose  . acetaminophen (TYLENOL) tablet 650 mg  650 mg Oral Q6H PRN Opyd, Ilene Qua, MD   650 mg at 04/08/18 2303   Or  . acetaminophen (TYLENOL) suppository 650 mg  650 mg Rectal Q6H PRN Opyd, Ilene Qua, MD      . baclofen (LIORESAL) tablet 20 mg  20 mg Oral TID Juanito Doom, MD   20 mg at 04/10/18 2110  . chlorhexidine (PERIDEX) 0.12 % solution 15 mL  15 mL Mouth Rinse BID Margaretha Seeds, MD   15 mL at 04/10/18 2110  . diphenhydrAMINE (BENADRYL) capsule 50 mg  50 mg Oral QHS PRN Mauri Brooklyn, MD   50 mg at 04/09/18 0052  . enoxaparin (LOVENOX) injection 30 mg  30 mg  Subcutaneous Q24H Margaretha Seeds, MD   30 mg at 04/10/18 0626  . famotidine (PEPCID) 40 MG/5ML suspension 20 mg  20 mg Per Tube Once Opyd, Ilene Qua, MD      . feeding supplement (OSMOLITE 1.2 CAL) liquid 1,000 mL  1,000 mL Per Tube Continuous Margaretha Seeds, MD 50 mL/hr at 04/10/18 1546 1,000 mL at 04/10/18 1546  . feeding supplement (PRO-STAT SUGAR FREE 64) liquid 30 mL  30 mL Per Tube Daily Simonne Maffucci B, MD   30 mL at 04/10/18 0904  . fentaNYL (SUBLIMAZE) injection 100 mcg  100 mcg Intravenous Q2H PRN Shearon Stalls, Rahul P, PA-C   100 mcg at 04/08/18 1721  . ferrous sulfate 300 (60 Fe) MG/5ML syrup 220 mg  220 mg Per Tube Q breakfast Irene Pap N, DO   220 mg at 04/11/18 9485  . HYDROmorphone (DILAUDID) injection 0.5 mg  0.5 mg Intravenous Q2H PRN Margaretha Seeds, MD   0.5 mg at 04/09/18 1849  . insulin aspart (novoLOG) injection 0-15 Units  0-15 Units Subcutaneous Q4H Margaretha Seeds, MD   3 Units at 04/11/18 [phone]  . insulin glargine (LANTUS) injection 5 Units  5 Units Subcutaneous QHS Opyd,  Ilene Qua, MD   5 Units at 04/10/18 2109  . ipratropium-albuterol (DUONEB) 0.5-2.5 (3) MG/3ML nebulizer solution 3 mL  3 mL Nebulization Q6H Margaretha Seeds, MD   3 mL at 04/11/18 0857  . lactated ringers bolus 1,000 mL  1,000 mL Intravenous Once Margaretha Seeds, MD      . LORazepam (ATIVAN) tablet 0.5 mg  0.5 mg Oral BID PRN Margaretha Seeds, MD   0.5 mg at 04/10/18 1957  . MEDLINE mouth rinse  15 mL Mouth Rinse q12n4p Margaretha Seeds, MD   15 mL at 04/10/18 1547  . midazolam (VERSED) injection 2 mg  2 mg Intravenous Q15 min PRN Desai, Rahul P, PA-C      . midazolam (VERSED) injection 2 mg  2 mg Intravenous Q2H PRN Desai, Rahul P, PA-C   2 mg at 04/05/18 1337  . midodrine (PROAMATINE) tablet 10 mg  10 mg Per Tube TID WC Desai, Rahul P, PA-C   10 mg at 04/11/18 0752  . ondansetron (ZOFRAN) tablet 4 mg  4 mg Oral Q6H PRN Opyd, Ilene Qua, MD       Or  . ondansetron (ZOFRAN) injection 4 mg  4  mg Intravenous Q6H PRN Opyd, Ilene Qua, MD      . oxyCODONE (Oxy IR/ROXICODONE) immediate release tablet 5 mg  5 mg Oral Q4H PRN Margaretha Seeds, MD   5 mg at 04/11/18 [phone]  . polyethylene glycol (MIRALAX / GLYCOLAX) packet 17 g  17 g Oral Daily Opyd, Ilene Qua, MD   17 g at 04/10/18 0904  . senna-docusate (Senokot-S) tablet 1 tablet  1 tablet Oral QHS PRN Opyd, Ilene Qua, MD      . sodium chloride flush (NS) 0.9 % injection 3 mL  3 mL Intravenous Q12H Opyd, Ilene Qua, MD   3 mL at 04/10/18 6789     Discharge Medications: Please see discharge summary for a list of discharge medications.  Relevant Imaging Results:  Relevant Lab Results:   Additional Information SS#: [id number]  Eileen Stanford, LCSW

## 2018-04-11 NOTE — Progress Notes (Signed)
Plan of care discussed with [place] Crane MD and SW.  RN asked about plan to wean down O2 and change trach to cuffless before discharge.  MD stated that if pt tolerates 3L of O2 for 1-2 hours she does not need to go to a floor and can go straight to the SNF.  RN notified SW of the need for a trach change before discharge.  PTAR arrived on unit for pt but RN explained the situation and exchanged numbers with PTAR to call when ready.  Pt resting comfortably. Will continue to monitor.

## 2018-04-11 NOTE — Discharge Instructions (Signed)
How to Care for a Feeding Tube ° °A feeding tube is a tube used to give medicine, water, and liquid food. A person may have this tube if she or he has trouble swallowing or cannot take food or medicine. °Supplies needed to care for the tube site: °· Clean gloves. °· Clean washcloth, gauze pads, or soft paper towel. °· Cotton swabs. °· A skin barrier ointment or cream, such as petroleum jelly. °· Soap and water. °· Pre-cut foam pads or gauze (for around the tube). °· Tube tape. °· An anchoring device (optional). °How to care for the tube site ° °1. Have all supplies ready and close to you. °2. Wash your hands. °3. Put on gloves. °4. Change any pad or gauze near the tube if: °? It is dirty. °? It is wet. °? It has been there for more than one day. °5. Check the skin around the tube. Call the doctor if you see any of these: °? Red skin. °? A rash. °? Swelling. °? Leaking fluid. °? Extra skin. °6. Dip the gauze and cotton swabs in water and soap. °7. Use the cotton swabs to wipe the skin that is closest to the tube. °8. Use the gauze pads to wipe the rest of the skin near the tube. °9. Rinse with water. °10. Dry the area with a clean washcloth, dry gauze pad, or soft paper towel. °11. If the skin is red, use a cotton swab to put on a skin barrier cream or ointment. Put it on by making little circles. Do not apply antibiotic ointments at the tube site. °12. Put a new pre-cut foam pad or gauze around the tube. If there is no fluid at the tube site, you do not need a pad or gauze. °13. Tape down the edges. °14. Use tape or an anchoring device to attach the tube to the skin. Do this for comfort or as told. Each time you use tape, put it in a different place. °15. Sit the person up. °16. Throw away used supplies. °17. Take off your gloves. °18. Wash your hands. °Supplies needed to flush a feeding tube: °· Clean gloves. °· A clean 60 mL syringe that connects to the feeding tube. °· A towel. °· Germ-free (sterile) or purified  water. Follow these rules: °? Use germ-free water if: °§ Your body's defense system (immune system) is weak and you have trouble getting better from infections (are immunocompromised). °§ You do not know how many chemicals are in your water. °? Do not use water from lakes or other bodies of water unless you treat it or filter it first. °? To make drinking water pure by boiling: °§ Boil water for 1 minute or longer. Keep a lid over the water while it boils. °§ Let the water cool off to room temperature before you use it. °How to flush a feeding tube °1. Have all supplies ready and close to you. °2. Wash your hands. °3. Put on gloves. °4. Pull 30 mL of water into the syringe. °5. Before you push water into (flush) the tube, put the towel under the tube to catch any fluid leaks. °6. Bend (kink) the feeding tube while you do one of these things: °? Disconnect it from the feeding-bag tubing. °? Take off the cap at the end of the tube. °7. Put the tip of the syringe into the end of the feeding tube. °8. Stop bending the tube. °9. Use the syringe to slowly put the water   in the tube. If the water will not go in the tube: ? Have the person lie on his or her left side. ? Try putting the water in the tube again. ? Do not push hard to make the water go in. 10. Take out the syringe and put the cap on the tube. 11. Throw away used supplies. 12. Take off your gloves. 13. Wash your hands. Follow these instructions at home: Caring for the tube  If the person has a foam pad or gauze near the tube, change it: ? Every day. ? When it is dirty. ? When it is wet.  Do not put antibiotic ointments by the tube. Flushing the tube  Do not use a syringe that is smaller than 60 mL.  Flush the tube at all of these times: ? Before you give medicine. ? Between medicines. ? After the person gets the last medicine before a feeding.  Do not mix medicines with formula. Do not mix medicines with other medicines.  Completely  flush medicines through the tube. That way, they will not mix with formula. Contact a doctor if:  The tube gets blocked or clogged.  You find any of these on the skin around the tube site: ? Red skin. ? A rash. ? Swelling. ? Leaking fluid. ? Extra skin. Summary  A feeding tube is a tube used to give medicine, water, and liquid food. A person may have this tube if she or he has trouble swallowing or cannot take food or medicine.  Follow the doctor's instructions to care for the tube site and flush the tube every day.  Contact a doctor if the tube gets blocked or clogged. This information is not intended to replace advice given to you by your health care provider. Make sure you discuss any questions you have with your health care provider. Document Released: 10/01/2011 Document Revised: 02/15/2016 Document Reviewed: 02/15/2016 Elsevier Interactive Patient Education  2019 [place].   Hypoxemia Hypoxemia occurs when the blood does not contain enough oxygen. The body cannot work well when it does not have enough oxygen because every part of the body needs oxygen. Oxygen enters the lungs when we breathe in, then it travels to all parts of the body through the blood. Hypoxemia can develop suddenly or slowly. What are the causes? Common causes of this condition include:  Long-term (chronic) lung diseases, such as chronic obstructive pulmonary disease (COPD) or interstitial lung disease.  Disorders that affect breathing at night, such as sleep apnea.  Fluid buildup in the lungs (pulmonary edema).  Lung infection (pneumonia).  Lung or throat cancer.  Abnormal blood flow that bypasses the lungs (having a shunt).  Certain diseasesthat affect nerves or muscles.  A collapsed lung (pneumothorax).  A blood clot in the lungs (pulmonary embolus).  Certain types of heart disease.  Slow or shallow breathing (hypoventilation).  Certain medicines.  High altitudes.  Toxic  chemicals, smoke, and gases. What are the signs or symptoms? In some cases, there may be no symptoms of this condition. If you do have symptoms, they may include:  Shortness of breath (dyspnea).  Bluish color of the skin, lips, or nail beds.  Breathing that is fast, noisy, or shallow.  A fast heartbeat.  Feeling tired or sleepy.  Feeling confused or worried. If hypoxemia develops quickly, you will likely have dyspnea. If hypoxemia develops slowly over months or years, you may not notice any symptoms. How is this diagnosed? This condition is diagnosed by:  A physical exam.  Blood tests.  A test that measures the percentage of oxygen in your blood (pulse oximetry). This is done with a sensor that is placed on your finger, toe, or earlobe. How is this treated?  Treatment for this condition depends on the underlying cause of your hypoxemia. You will likely be treated with oxygen therapy to restore your blood oxygen level. Depending on the cause of your hypoxemia, you may need oxygen therapy for a short time (weeks or months), or you may need it for the rest of your life. Your health care provider may also recommend other therapies to treat the underlying cause of your hypoxemia. Follow these instructions at home:   Take over-the-counter and prescription medicines only as told by your health care provider.  If you are on oxygen therapy, follow oxygen safety precautions as directed by your health care provider. These may include: ? Always having a backup supply of oxygen. ? Not allowing anyone to smoke or have a fire around oxygen. ? Handling oxygen tanks carefully and as instructed.  Do not use any products that contain nicotine or tobacco, such as cigarettes and e-cigarettes. If you need help quitting, ask your health care provider. Stay away from people who smoke.  Keep all follow-up visits as told by your health care provider. This is important. Contact a health care provider  if:  You have any concerns about your oxygen therapy.  You have trouble breathing, even during or after treatment.  You become short of breath when you exercise.  You are tired when you wake up.  You have a headache when you wake up. Get help right away if:  Your shortness of breath gets worse, especially with normal or minimal activity.  You have a bluish color of the skin, lips, or nail beds.  You become confused or you cannot think properly.  You cough up dark mucus or blood.  You have chest pain.  You have a fever. Summary  Hypoxemia occurs when the blood does not contain enough oxygen.  Hypoxemia may or may not cause symptoms. Often, the main symptom is shortness of breath (dyspnea).  Depending on the cause of your hypoxemia, you may need oxygen therapy for a short time (weeks or months), or you may need it for the rest of your life.  If you are on oxygen therapy, follow oxygen safety precautions as directed by your health care provider. This information is not intended to replace advice given to you by your health care provider. Make sure you discuss any questions you have with your health care provider. Document Released: 07/22/2010 Document Revised: 12/11/2015 Document Reviewed: 12/11/2015 Elsevier Interactive Patient Education  2019 [employer]   Urinary Tract Infection, Adult A urinary tract infection (UTI) is an infection of any part of the urinary tract. The urinary tract includes:  The kidneys.  The ureters.  The bladder.  The urethra. These organs make, store, and get rid of pee (urine) in the body. What are the causes? This is caused by germs (bacteria) in your genital area. These germs grow and cause swelling (inflammation) of your urinary tract. What increases the risk? You are more likely to develop this condition if:  You have a small, thin tube (catheter) to drain pee.  You cannot control when you pee or poop (incontinence).  You are  female, and: ? You use these methods to prevent pregnancy: ? A medicine that kills sperm (spermicide). ? A device that blocks sperm (diaphragm). ?  You have low levels of a female hormone (estrogen). ? You are pregnant.  You have genes that add to your risk.  You are sexually active.  You take antibiotic medicines.  You have trouble peeing because of: ? A prostate that is bigger than normal, if you are female. ? A blockage in the part of your body that drains pee from the bladder (urethra). ? A kidney stone. ? A nerve condition that affects your bladder (neurogenic bladder). ? Not getting enough to drink. ? Not peeing often enough.  You have other conditions, such as: ? Diabetes. ? A weak disease-fighting system (immune system). ? Sickle cell disease. ? Gout. ? Injury of the spine. What are the signs or symptoms? Symptoms of this condition include:  Needing to pee right away (urgently).  Peeing often.  Peeing small amounts often.  Pain or burning when peeing.  Blood in the pee.  Pee that smells bad or not like normal.  Trouble peeing.  Pee that is cloudy.  Fluid coming from the vagina, if you are female.  Pain in the belly or lower back. Other symptoms include:  Throwing up (vomiting).  No urge to eat.  Feeling mixed up (confused).  Being tired and grouchy (irritable).  A fever.  Watery poop (diarrhea). How is this treated? This condition may be treated with:  Antibiotic medicine.  Other medicines.  Drinking enough water. Follow these instructions at home:  Medicines  Take over-the-counter and prescription medicines only as told by your doctor.  If you were prescribed an antibiotic medicine, take it as told by your doctor. Do not stop taking it even if you start to feel better. General instructions  Make sure you: ? Pee until your bladder is empty. ? Do not hold pee for a long time. ? Empty your bladder after sex. ? Wipe from front to  back after pooping if you are a female. Use each tissue one time when you wipe.  Drink enough fluid to keep your pee pale yellow.  Keep all follow-up visits as told by your doctor. This is important. Contact a doctor if:  You do not get better after 1-2 days.  Your symptoms go away and then come back. Get help right away if:  You have very bad back pain.  You have very bad pain in your lower belly.  You have a fever.  You are sick to your stomach (nauseous).  You are throwing up. Summary  A urinary tract infection (UTI) is an infection of any part of the urinary tract.  This condition is caused by germs in your genital area.  There are many risk factors for a UTI. These include having a small, thin tube to drain pee and not being able to control when you pee or poop.  Treatment includes antibiotic medicines for germs.  Drink enough fluid to keep your pee pale yellow. This information is not intended to replace advice given to you by your health care provider. Make sure you discuss any questions you have with your health care provider. Document Released: 06/25/2007 Document Revised: 07/16/2017 Document Reviewed: 07/16/2017 Elsevier Interactive Patient Education  2019 [place].   Hypoxia Hypoxia is a condition that happens when there is a lack of oxygen in the body's tissues and organs. When there is not enough oxygen, organs cannot work as they should. This causes serious problems throughout the body and in the brain. What are the causes? This condition may be caused by:  Exposure  to high altitude.  A collapsed lung (pneumothorax).  Lung infection (pneumonia).  Lung injury.  Long-term (chronic) lung disease, such as COPD (chronic obstructive pulmonary disease).  Blood collecting in the chest cavity (hemothorax).  Food, saliva, or vomit getting into the airway (aspiration).  Reduced blood flow (ischemia).  Severe blood loss.  Slow or shallow breathing  (hypoventilation).  Blood disorders, such as anemia.  Carbon monoxide poisoning.  The heart suddenly stopping (cardiac arrest).  Anesthetic medicines.  Drowning.  Choking. What are the signs or symptoms? Symptoms of this condition include:  Headache.  Fatigue.  Drowsiness.  Forgetfulness.  Nausea.  Confusion.  Shortness of breath.  Dizziness.  Bluish color of the skin, lips, or nail beds (cyanosis).  Change in consciousness or awareness. If hypoxia is not treated, it can lead to convulsions, loss of consciousness (coma), or brain damage. How is this diagnosed? This condition may be diagnosed based on:  A physical exam.  Blood tests.  A test that measures how much oxygen is in your blood (pulse oximetry). This is done with a sensor that is placed on your finger, toe, or earlobe.  Chest X-ray.  Tests to check your lung function (pulmonary function tests).  A test to check the electrical activity of your heart (electrocardiogram, ECG). You may have other tests to determine the cause of your hypoxia. How is this treated?  Treatment for this condition depends on what is causing the hypoxia. You will likely be treated with oxygen therapy. This may be done by giving you oxygen through a face mask or through tubes in your nose. Your health care provider may also recommend other therapies to treat the underlying cause of your hypoxia. Follow these instructions at home:  Take over-the-counter and prescription medicines only as told by your health care provider.  Do not use any products that contain nicotine or tobacco, such as cigarettes and e-cigarettes. If you need help quitting, ask your health care provider.  Avoid secondhand smoke.  Work with your health care provider to manage any chronic conditions you have that may be causing hypoxia, such as COPD.  Keep all follow-up visits as told by your health care provider. This is important. Contact a health care  provider if:  You have a fever.  You have trouble breathing, even after treatment.  You become extremely short of breath when you exercise. Get help right away if:  Your shortness of breath gets worse, especially with normal or very little activity.  Your skin, lips, or nail beds have a bluish color.  You become confused or you cannot think properly.  You have chest pain. Summary  Hypoxia is a condition that happens when there is a lack of oxygen in the body's tissues and organs.  If hypoxia is not treated, it can lead to convulsions, loss of consciousness (coma), or brain damage.  Symptoms of hypoxia can include a headache, shortness of breath, confusion, nausea, and a bluish skin color.  Hypoxia has many possible causes, including exposure to high altitude, carbon monoxide poisoning, or other health issues, such as blood disorders or cardiac arrest.  Hypoxia is usually treated with oxygen therapy. This information is not intended to replace advice given to you by your health care provider. Make sure you discuss any questions you have with your health care provider. Document Released: 02/25/2016 Document Revised: 02/25/2016 Document Reviewed: 02/25/2016 Elsevier Interactive Patient Education  2019 [employer].

## 2018-04-11 NOTE — Discharge Summary (Addendum)
Discharge Summary  Angela Page KGU:[id number] DOB: [birth year]  PCP: Default, Provider, MD  Admit date: 04/05/2018 Discharge date: 04/11/2018  Time spent: 35 minutes  Recommendations for Outpatient Follow-up:  1. Follow-up with your primary care provider 2. Follow-up with pulmonology 3. Take your medications as prescribed  Discharge Diagnoses:  Active Hospital Problems   Diagnosis Date Noted  . Sepsis ([place]) 01/31/2018  . Quadriplegia and quadriparesis ([place])   . Acute on chronic respiratory failure with hypoxia ([place])   . Complicated UTI (urinary tract infection) 03/09/2017  . Chronic obstructive pulmonary disease ([place])   . 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 Recurrent UTI 03/31/2014  . Stage 4 skin ulcer of sacral region [facility]) 01/06/2014    Resolved Hospital Problems  No resolved problems to display.    Discharge Condition: Stable  Diet recommendation: Continue PEG tube feeding  Vitals:   04/11/18 0739 04/11/18 0800  BP:  99/65  Pulse:  94  Resp:  19  Temp: 98.1 F (36.7 C)   SpO2:  98%    History of present illness:  48 year old female with quadriplegia, chronic trach and PEG tube feeding. female with quadriplegia, chronic trach and PEG tube feeding. She was admitted 3/16 with sepsis felt to be due to UTI vs PNA. PCCM consulted for confusion and increased WOB status. Admitted to ICU. Trach exchanged for cuffed trach and placed on ventilator.  Transferred to [facility] service on 04/10/2018.  04/10/18: Patient seen and examined at her bedside.  No acute events overnight.  She has no new complaints.  She denies any chest pain, palpitation or dyspnea at rest.  Trach in place with 100% oxygen saturation on 8 L.  04/11/18: Patient seen and examined at her bedside.  No acute events overnight.  She has no new complaints.  She denies any chest pain palpitations or dyspnea at rest.  At baseline she is on 3 L of oxygen continuously.  Will wean down oxygen supplementation as tolerated.  Vital signs and labs reviewed and  are stable.  On the day of discharge, the patient was hemodynamically stable.  She will need to follow-up with her primary care provider and pulmonology outpatient.    Hospital Course:  Principal Problem:   Sepsis ([place]) Active Problems:   Neurogenic orthostatic hypotension (HCC)   Chronic obstructive pulmonary disease (HCC)   Recurrent UTI   Stage 4 skin ulcer of sacral region [facility])   Complicated UTI (urinary tract infection)   Acute on chronic respiratory failure with hypoxia (HCC)   Quadriplegia and quadriparesis (HCC)  Resolving sepsis secondary to HCAP MRSA pneumonia versus Proteus mirabilis UTI, poa Presented with leukocytosis, tachycardia and tachypnea Completed 5 days of IV vancomycin and cefepime Sepsis physiology has resolved Continue mupirocin ointment WBC continues to trend down off antibiotics WBC 12 K from 13 K yesterday Independently reviewed chest x-ray done on 04/06/2018 which revealed right lower lobe infiltrates and left-sided pacemaker Afebrile Vital signs reviewed and are stable  Chronic hypotension Blood pressure is normotensive On midodrine at home chronically Continue midodrine  Trach dependence Continue trach care Maintain O2 saturation greater than 92% Follow-up with pulmonology outpatient  Acute blood loss anemia, Unclear etiology We will obtain FOBT, not collected Iron studies remarkable for iron deficiency Started on ferrous solution to and 20 mg daily Hemoglobin stable 9.9 from 9.0 yesterday MCV 88  Chronic hypoxia with chronic trach On 3 L of oxygen supplementation continuously at baseline On 8 L of oxygen saturating 100% Wean down oxygen supplementation as tolerated  Chronic diastolic CHF  Last 2D echo done on 04/06/2018 revealed LVEF 60 to 65% Strict I's and O's and daily weight  Dysphagia Continue PEG tube feeding Maintain head of bed at least 30 degree angle Aspiration precautions  Post pacemaker Left-sided pacemaker  noted on chest x-ray No acute issues Follow-up with cardiology outpatient   Moderate protein calorie malnutrition Albumin 3.2 on 04/05/2018 Increase protein calorie consumption  Extensive pressure ulcers, present on admission Measurement: 1.Stage 3pressureinjuryleft ischium; 4cm c 1cm x 0.3cm 2.Stage 3pressureinjuryright ischium; 3cm x 2cm x 0.3cm 3.Stage 3 sacrum; 5cm x 11cm x 0.1cm 4.Stage 3 proximal sacrum; 2cm x 2cm x 0.5cm 5.Stage 1 medial left knee: 1cm x 1cm x 0 6.Stage 1 medial right knee1cm x 1cm x 0      Code Status: Full code    Consultants:  PCCM  Procedures:  None  Antimicrobials:  None  DVT prophylaxis: Subcu Lovenox daily      Discharge Exam: BP 99/65 (BP Location: Right Arm)   Pulse 94   Temp 98.1 F (36.7 C) (Oral)   Resp 19   Ht 5\' 2"  (1.575 m)   Wt 45.2 kg   LMP 05/27/2016 (Within Days) Comment: Irregular periods since October 2015.  SpO2 98%   BMI 18.23 kg/m  . General: 48 y.o. year-old female well developed well nourished in no acute distress.  Alert and interactive. . Cardiovascular: Regular rate and rhythm with no rubs or gallops.  No thyromegaly or JVD noted.   Marland Kitchen Respiratory: Clear to auscultation with no wheezes or rales.  Poor inspiratory effort. . Abdomen: Soft nontender nondistended with normal bowel sounds x4 quadrants.  PEG tube in place. . Musculoskeletal: Trace lower extremity edema. 2/4 pulses in all 4 extremities.  Extremities chronically flaccid and contracted. Marland Kitchen Psychiatry: Mood is appropriate for condition and setting  Discharge Instructions You were cared for by a hospitalist during your hospital stay. If you have any questions about your discharge medications or the care you received while you were in the hospital after you are discharged, you can call the unit and asked to speak with the hospitalist on call if the hospitalist that took care of you is not available. Once you are  discharged, your primary care physician will handle any further medical issues. Please note that NO REFILLS for any discharge medications will be authorized once you are discharged, as it is imperative that you return to your primary care physician (or establish a relationship with a primary care physician if you do not have one) for your aftercare needs so that they can reassess your need for medications and monitor your lab values.   Allergies as of 04/11/2018      Reactions   Penicillins Hives   Has patient had a PCN reaction causing immediate rash, facial/tongue/throat swelling, SOB or lightheadedness with hypotension: Yes Has patient had a PCN reaction causing severe rash involving mucus membranes or skin necrosis: Yes Has patient had a PCN reaction that required hospitalization No Has patient had a PCN reaction occurring within the last 10 years: No-childhood allergy If all of the above answers are "NO", then may proceed with Cephalosporin  Tolerated cephalosporins in past   Erythromycin Nausea And Vomiting   Nitrofurantoin Monohyd Macro Nausea And Vomiting   Oxybutynin    Dries mouth out       Medication List    STOP taking these medications   collagenase ointment Commonly known as:  SANTYL   diphenhydrAMINE 50 MG tablet Commonly known as:  BENADRYL   insulin detemir 100 UNIT/ML injection Commonly known as:  LEVEMIR   Melatonin 3 MG Caps   Melatonin 3 MG Tabs   umeclidinium bromide 62.5 MCG/INH Aepb Commonly known as:  INCRUSE ELLIPTA     TAKE these medications   acetaminophen 500 MG tablet Commonly known as:  TYLENOL Take 1,000 mg by mouth every 6 (six) hours as needed for mild pain, moderate pain or headache (Fever >100.4).   albuterol 108 (90 Base) MCG/ACT inhaler Commonly known as:  PROVENTIL HFA;VENTOLIN HFA Inhale 1-2 puffs into the lungs every 6 (six) hours as needed for wheezing or shortness of breath.   baclofen 20 MG tablet Commonly known as:  LIORESAL  Take 1 tablet (20 mg total) by mouth every 8 (eight) hours as needed for muscle spasms.   Basaglar KwikPen 100 UNIT/ML Sopn Inject 0.05 mLs (5 Units total) into the skin at bedtime. What changed:  how much to take   DULoxetine 30 MG capsule Commonly known as:  CYMBALTA Take 1 capsule (30 mg total) by mouth 2 (two) times daily.   famotidine 40 MG/5ML suspension Commonly known as:  PEPCID Place 2.5 mLs (20 mg total) into feeding tube at bedtime. What changed:  how to take this   feeding supplement (GLUCERNA 1.2 CAL) Liqd Place 1,000 mLs into feeding tube daily.   feeding supplement (PRO-STAT SUGAR FREE 64) Liqd Place 30 mLs into feeding tube 2 (two) times daily. What changed:  how to take this   ferrous sulfate 300 (60 Fe) MG/5ML syrup Place 3.7 mLs (220 mg total) into feeding tube daily with breakfast.   furosemide 40 MG tablet Commonly known as:  LASIX Place 1 tablet (40 mg total) into feeding tube daily. What changed:  how to take this   gabapentin 100 MG capsule Commonly known as:  NEURONTIN Take 1 capsule (100 mg total) by mouth 3 (three) times daily. What changed:  how much to take   guaiFENesin 100 MG/5ML liquid Commonly known as:  ROBITUSSIN Take 300 mg by mouth every 6 (six) hours as needed for cough.   Heparin Sodium (Porcine) PF 5000 UNIT/ML Soln Inject 5,000 Units as directed every 8 (eight) hours.   insulin aspart 100 UNIT/ML injection Commonly known as:  novoLOG Inject 10 Units into the skin every 4 (four) hours. For glucose 150 to 200 use 1 units, for glucose 201-250 use 2 units, for 251 to 300 use 4 units, for 301 to 350 use 6 units, for 351 to 400 use 8 units, for 401 or greater use 10 units. What changed:    how much to take  when to take this  additional instructions   ipratropium-albuterol 0.5-2.5 (3) MG/3ML Soln Commonly known as:  DUONEB Take 3 mLs by nebulization every 6 (six) hours as needed (SOB).   LORazepam 0.5 MG tablet Commonly  known as:  Ativan Take 1 tablet (0.5 mg total) by mouth every 8 (eight) hours as needed for anxiety. What changed:  when to take this   metoCLOPramide 5 MG tablet Commonly known as:  REGLAN Take 5 mg by mouth 4 (four) times daily. Via G-tube   midodrine 10 MG tablet Commonly known as:  PROAMATINE Take 10 mg by mouth 3 (three) times daily.   ondansetron 4 MG tablet Commonly known as:  ZOFRAN Take 4 mg by mouth every 6 (six) hours as needed for nausea or vomiting.   oxyCODONE 5 MG immediate release tablet Commonly known as:  Oxy IR/ROXICODONE Place  1 tablet (5 mg total) into feeding tube every 4 (four) hours as needed (breakthrough pain). What changed:  how to take this   OXYGEN Inhale 3 L into the lungs continuous.   polyethylene glycol packet Commonly known as:  MIRALAX / GLYCOLAX Take 17 g by mouth daily. Hold if diarrhea.   senna-docusate 8.6-50 MG tablet Commonly known as:  Senokot-S Take 2 tablets by mouth at bedtime as needed for mild constipation.   sodium chloride 0.65 % Soln nasal spray Commonly known as:  OCEAN Place 2 sprays into both nostrils daily as needed for congestion.   SODIUM CHLORIDE PO Take 500 mg by mouth 2 (two) times daily.   temazepam 7.5 MG capsule Commonly known as:  RESTORIL Take 1 capsule (7.5 mg total) by mouth at bedtime as needed for sleep. What changed:  when to take this   tiotropium 18 MCG inhalation capsule Commonly known as:  SPIRIVA Place 18 mcg into inhaler and inhale daily.      Allergies  Allergen Reactions  . Penicillins Hives    Has patient had a PCN reaction causing immediate rash, facial/tongue/throat swelling, SOB or lightheadedness with hypotension: Yes Has patient had a PCN reaction causing severe rash involving mucus membranes or skin necrosis: Yes Has patient had a PCN reaction that required hospitalization No Has patient had a PCN reaction occurring within the last 10 years: No-childhood allergy If all of the  above answers are "NO", then may proceed with Cephalosporin  Tolerated cephalosporins in past  . Erythromycin Nausea And Vomiting  . Nitrofurantoin Monohyd Macro Nausea And Vomiting  . Oxybutynin     Dries mouth out    Follow-up Information    [place]. Call in 1 day(s).   Why:  Please call for a post hospital follow-up appointment Contact information: [place] [address] [phone]       Margaretha Seeds, MD. Call in 1 day(s).   Specialty:  Pulmonary Disease Why:  Please call for a post hospital follow-up appointment Contact information: [place] [address] [phone]            The results of significant diagnostics from this hospitalization (including imaging, microbiology, ancillary and laboratory) are listed below for reference.    Significant Diagnostic Studies: Portable Chest Xray  Result Date: 04/06/2018 CLINICAL DATA:  Respiratory failure EXAM: PORTABLE CHEST 1 VIEW COMPARISON:  04/05/2018 FINDINGS: Normal heart size. Dual-chamber pacer leads from the left in stable position. Tracheostomy tube is present. Small right pleural effusion with adjacent lower lobe opacity. No pneumothorax. IMPRESSION: Increased right lower lobe opacity with small pleural effusion. Electronically Signed   By: Monte Fantasia M.D.   On: 04/06/2018 08:55   Dg Chest Port 1 View  Result Date: 04/05/2018 CLINICAL DATA:  Respiratory failure. EXAM: PORTABLE CHEST 1 VIEW COMPARISON:  Chest x-ray from same day at 1:40 a.m. FINDINGS: Unchanged tracheostomy tube and left chest wall pacemaker. Stable cardiomediastinal silhouette. Normal pulmonary vascularity. Unchanged right basilar atelectasis. No focal consolidation, pleural effusion, or pneumothorax. No acute osseous abnormality. IMPRESSION: 1. Unchanged right basilar atelectasis. Electronically Signed   By: Titus Dubin M.D.   On: 04/05/2018 12:48    Dg Chest Portable 1 View  Result Date: 04/05/2018 CLINICAL DATA:  Cough EXAM: PORTABLE CHEST 1 VIEW COMPARISON:  02/14/2018 FINDINGS: Unchanged position of left chest wall pacemaker leads. There is mild opacity at the right lung base. No pleural  effusion or pneumothorax. Tracheostomy tube tip is just above the level of the clavicular heads. IMPRESSION: Mild right basilar opacity, likely atelectasis. Electronically Signed   By: Ulyses Jarred M.D.   On: 04/05/2018 01:54   Dg Abd Portable 1v  Result Date: 04/05/2018 CLINICAL DATA:  Check gastric catheter EXAM: PORTABLE ABDOMEN - 1 VIEW COMPARISON:  12/31/17 FINDINGS: Scattered large and small bowel gas is noted. Gastrostomy catheter is noted within the gastric lumen. No other focal abnormality is noted. IMPRESSION: Gastrostomy catheter within the stomach. Electronically Signed   By: Inez Catalina M.D.   On: 04/05/2018 11:00    Microbiology: Recent Results (from the past 240 hour(s))  Blood Culture (routine x 2)     Status: None   Collection Time: 04/05/18  1:45 AM  Result Value Ref Range Status   Specimen Description BLOOD LEFT ARM  Final   Special Requests   Final    BOTTLES DRAWN AEROBIC AND ANAEROBIC Blood Culture adequate volume   Culture   Final    NO GROWTH 5 DAYS Performed at [facility], [address], [place], [place] [address]    Report Status 04/10/2018 FINAL  Final  Blood Culture (routine x 2)     Status: None   Collection Time: 04/05/18  2:15 AM  Result Value Ref Range Status   Specimen Description BLOOD LEFT FOREARM  Final   Special Requests   Final    BOTTLES DRAWN AEROBIC AND ANAEROBIC Blood Culture adequate volume   Culture   Final    NO GROWTH 5 DAYS Performed at [facility], [place] [address], [place], [place] [address]    Report Status 04/10/2018 FINAL  Final  Urine culture     Status: Abnormal   Collection Time: 04/05/18  2:18 AM  Result Value Ref Range Status   Specimen Description URINE,  CATHETERIZED  Final   Special Requests   Final    NONE Performed at [facility], [place] [address] [place], [place] [address]    Culture >=100,000 COLONIES/mL PROTEUS MIRABILIS (A)  Final   Report Status 04/07/2018 FINAL  Final   Organism ID, Bacteria PROTEUS MIRABILIS (A)  Final      Susceptibility   Proteus mirabilis - MIC*    AMPICILLIN <=2 SENSITIVE Sensitive     CEFAZOLIN <=4 SENSITIVE Sensitive     CEFTRIAXONE <=1 SENSITIVE Sensitive     CIPROFLOXACIN >=4 RESISTANT Resistant     GENTAMICIN <=1 SENSITIVE Sensitive     IMIPENEM 8 INTERMEDIATE Intermediate     NITROFURANTOIN 128 RESISTANT Resistant     TRIMETH/SULFA 40 SENSITIVE Sensitive     AMPICILLIN/SULBACTAM <=2 SENSITIVE Sensitive     PIP/TAZO <=4 SENSITIVE Sensitive     * >=100,000 COLONIES/mL PROTEUS MIRABILIS  Culture, respiratory (non-expectorated)     Status: None   Collection Time: 04/05/18 11:58 AM  Result Value Ref Range Status   Specimen Description TRACHEAL ASPIRATE  Final   Special Requests NONE  Final   Gram Stain   Final    ABUNDANT WBC PRESENT, PREDOMINANTLY PMN FEW GRAM POSITIVE COCCI Performed at [facility], [place] [address] [place], [place] [address]    Culture   Final    MODERATE METHICILLIN RESISTANT STAPHYLOCOCCUS AUREUS   Report Status 04/07/2018 FINAL  Final   Organism ID, Bacteria METHICILLIN RESISTANT STAPHYLOCOCCUS AUREUS  Final      Susceptibility   Methicillin resistant staphylococcus aureus - MIC*    CIPROFLOXACIN >=  8 RESISTANT Resistant     ERYTHROMYCIN >=8 RESISTANT Resistant     GENTAMICIN <=0.5 SENSITIVE Sensitive     OXACILLIN >=4 RESISTANT Resistant     TETRACYCLINE <=1 SENSITIVE Sensitive     VANCOMYCIN 1 SENSITIVE Sensitive     TRIMETH/SULFA >=320 RESISTANT Resistant     CLINDAMYCIN <=0.25 SENSITIVE Sensitive     RIFAMPIN <=0.5 SENSITIVE Sensitive     Inducible Clindamycin NEGATIVE Sensitive     * MODERATE METHICILLIN RESISTANT STAPHYLOCOCCUS AUREUS  MRSA PCR  Screening     Status: Abnormal   Collection Time: 04/05/18 12:52 PM  Result Value Ref Range Status   MRSA by PCR POSITIVE (A) NEGATIVE Final    Comment:        The GeneXpert MRSA Assay (FDA approved for NASAL specimens only), is one component of a comprehensive MRSA colonization surveillance program. It is not intended to diagnose MRSA infection nor to guide or monitor treatment for MRSA infections. RESULT CALLED TO, READ BACK BY AND VERIFIED WITH: Arlis Porta RN 14:35 04/05/18 ([email or web address]) Performed at [facility], [place] [address] [place], [place] [address]      Labs: Basic Metabolic Panel: Recent Labs  Lab 04/06/18 0247 04/07/18 0245 04/08/18 0804 04/09/18 0347 04/10/18 0220 04/11/18 0309  NA 142 141 136 141 134* 136  K 4.0 3.7 3.5 3.6 4.0 4.4  CL 113* 113* 107 111 103 101  CO2 21* 20* 21* 26 23 25   GLUCOSE 83 101* 235* 165* 165* 174*  BUN 20 15 10 11 12 13   CREATININE 0.61 0.52 0.52 0.44 0.39* 0.38*  CALCIUM 8.6* 8.6* 8.1* 8.5* 8.5* 9.2  MG 1.9  --   --   --   --   --    Liver Function Tests: Recent Labs  Lab 04/05/18 0145  AST 25  ALT 18  ALKPHOS 162*  BILITOT 0.5  PROT 8.3*  ALBUMIN 3.2*   No results for input(s): LIPASE, AMYLASE in the last 168 hours. No results for input(s): AMMONIA in the last 168 hours. CBC: Recent Labs  Lab 04/05/18 0145  04/06/18 0620  04/07/18 0906 04/08/18 0804 04/09/18 0347 04/10/18 0220 04/11/18 0309  WBC 31.4*  --  29.0*   < > 13.6* 10.4 10.4 13.5* 12.1*  NEUTROABS 29.3*  --  25.4*  --   --   --   --   --   --   HGB 11.1*   < > 6.2*   < > 8.4* 8.1* 8.9* 9.0* 9.9*  HCT 37.5   < > 21.7*   < > 27.6* 26.4* 30.0* 29.4* 32.6*  MCV 87.4  --  93.5   < > 89.6 89.2 89.3 88.3 88.6  PLT 558*  --  294   < > 301 284 347 395 434*   < > = values in this interval not displayed.   Cardiac Enzymes: No results for input(s): CKTOTAL, CKMB, CKMBINDEX, TROPONINI in the last 168 hours. BNP: BNP (last 3 results) Recent Labs     12/16/17 0307 02/01/18 2014  BNP 280.4* 86.5    ProBNP (last 3 results) Recent Labs    04/28/17 1110  PROBNP 62.0    CBG: Recent Labs  Lab 04/10/18 1640 04/10/18 1935 04/10/18 2314 04/11/18 0418 04/11/18 0736  GLUCAP 219* 176* 157* 168* 158*       Signed:  Kayleen Memos, MD [facility] 04/11/2018, 8:56 AM

## 2018-04-11 NOTE — Progress Notes (Signed)
RT called me to assist with trach change, cuffed trach was getting stuck at the orifice even after cuff deflated Trach changed to #6 cuffless over suction catheter Patient tolerated procedure well, minimal bleeding  Krystian Younglove V. [place] Soho MD

## 2018-04-11 NOTE — TOC Initial Note (Signed)
Transition of Care [facility]) - Initial/Assessment Note    Patient Details  Name: Angela Page MRN: [id number] Date of Birth: [birth year]  Transition of Care [facility]) CM/SW Contact:    Glendale Chard, LCSW Phone Number: 04/11/2018, 9:39 AM  Clinical Narrative:                 Pt is LTC resident at [facility] and plan is for her to return back to facility. Mother agrees with plan. SW will facilitate discharge.   Plan is for pt to return back to facility.   Expected Discharge Plan: Skilled Nursing Facility Barriers to Discharge: No Barriers Identified   Patient Goals and CMS Choice   CMS Medicare.gov Compare Post Acute Care list provided to:: Patient Choice offered to / list presented to : Patient  Expected Discharge Plan and Services Expected Discharge Plan: [facility] In-house Referral: Clinical Social Work   Post Acute Care Choice: [place] Living arrangements for the past 2 months: [place] Expected Discharge Date: 04/11/18                        Prior Living Arrangements/Services Living arrangements for the past 2 months: [place] Lives with:: Self Patient language and need for interpreter reviewed:: No Do you feel safe going back to the place where you live?: Yes      Need for Family Participation in Patient Care: No (Comment) Care giver support system in place?: Yes (comment)(mother)   Criminal Activity/Legal Involvement Pertinent to Current Situation/Hospitalization: No - Comment as needed  Activities of Daily Living Home Assistive Devices/Equipment: Eyeglasses, Enteral Feeding Supplies, [employer], Vent/Trach supplies ADL Screening (condition at time of admission) Patient's cognitive ability adequate to safely complete daily activities?: Yes Is the patient deaf or have difficulty hearing?: No Does the patient have difficulty seeing, even when wearing glasses/contacts?: No Does the patient have  difficulty concentrating, remembering, or making decisions?: No Patient able to express need for assistance with ADLs?: Yes Does the patient have difficulty dressing or bathing?: Yes Independently performs ADLs?: No Communication: Independent with device (comment)(speaking valve) Dressing (OT): Dependent Is this a change from baseline?: Pre-admission baseline Grooming: Dependent Is this a change from baseline?: Pre-admission baseline Feeding: Dependent Is this a change from baseline?: Pre-admission baseline Bathing: Dependent Is this a change from baseline?: Pre-admission baseline Toileting: Dependent Is this a change from baseline?: Pre-admission baseline In/Out Bed: Dependent Is this a change from baseline?: Pre-admission baseline Walks in Home: Dependent(quadriplegia) Is this a change from baseline?: Pre-admission baseline Does the patient have difficulty walking or climbing stairs?: Yes Weakness of Legs: Both Weakness of Arms/Hands: Both  Permission Sought/Granted Permission sought to share information with : Facility Sport and exercise psychologist    Share Information with NAME: [name]  Permission granted to share info w AGENCY: [place] granted to share info w Relationship: Mother     Emotional Assessment Appearance:: Appears stated age Attitude/Demeanor/Rapport: Unable to Assess Affect (typically observed): Unable to Assess Orientation: : Oriented to Self, Oriented to  Time, Oriented to Place, Oriented to Situation Alcohol / Substance Use: Not Applicable Psych Involvement: No (comment)  Admission diagnosis:  Hypoxia [R48.54] Complicated UTI (urinary tract infection) [N39.0] Sepsis, due to unspecified organism, unspecified whether acute organ dysfunction present [facility]) [A41.9] Patient Active Problem List   Diagnosis Date Noted  . Chronic respiratory failure with hypoxia ([place])   . Encephalopathy acute 02/14/2018  . Sepsis ([place]) 01/31/2018  .  Acute on  chronic respiratory failure with hypoxia ([place])   . Quadriplegia and quadriparesis ([place])   . Lobar pneumonia ([place])   . At high risk for severe sepsis   . Tracheostomy status ([place])   . Decubitus ulcer of buttock, unstageable ([place])   . Hyperglycemic hyperosmolar nonketotic coma ([place]) 12/16/2017  . Lactic acid acidosis 12/16/2017  . AKI (acute kidney injury) ([place]) 12/16/2017  . Suprapubic catheter ([place]) 12/16/2017  . [place] (hyperglycemic hyperosmolar nonketotic coma) ([place]) 12/16/2017  . Elevated lactic acid level   . Pressure injury of skin 07/17/2017  . Kidney stone 07/16/2017  . Right ureteral stone 03/10/2017  . Complicated UTI (urinary tract infection) 03/09/2017  . Ineffective airway clearance   . Sepsis secondary to UTI ([place]) 06/30/2016  . Autonomic neuropathy 06/10/2016  . Presence of permanent cardiac pacemaker 06/10/2016  . 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 Chronically on opiate therapy 06/10/2016  . Indwelling Foley catheter present 06/10/2016  . PEG (percutaneous endoscopic gastrostomy) status ([place]) 06/10/2016  . Pulmonary hypertension ([place]) 06/10/2016  . Hypoalbuminemia 06/10/2016  . Abnormal transaminases   . Peripheral edema   . Tracheostomy dependence ([place])   . Chronic obstructive pulmonary disease ([place])   . Chronic diastolic CHF (congestive heart failure) ([place]) 01/23/2016  . Tracheostomy status ([place])   . Esophageal dysphagia   . Malnutrition of moderate degree 11/01/2015  . Tobacco abuse 10/31/2015  . Asthma 10/31/2015  . History of pneumonia 10/08/2015  . Pressure ulcer of contiguous region involving buttock and hip, stage 4 ([place]) 02/26/2015  . Hypotension 02/25/2015  . Acute metabolic encephalopathy 76/28/3151  . Leukocytosis 10/23/2014  . Hypokalemia 10/23/2014  . Decubitus ulcer of sacral region, stage 4 ([place]) 10/23/2014  . Anemia, iron deficiency 10/23/2014  . Quadriplegia, C5-C7 incomplete ([place]) 10/22/2014  . Depression   .  Protein-calorie malnutrition, severe ([place]) 09/05/2014  . 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 Recurrent UTI 03/31/2014  . Pressure ulcer of coccygeal region, stage 4 ([place]) 01/06/2014  . Stage 4 skin ulcer of sacral region ([place]) 01/06/2014  . Neuropathic pain 11/30/2013  . Paraplegia following spinal cord injury ([place]) 11/30/2013  . Spinal cord injury, cervical region ([place]) 11/30/2013  . Chronic pain due to injury 11/30/2013  . S/P cervical spinal fusion 10/27/2013  . Tracheostomy care ([place]) 10/27/2013  . 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 SCI (spinal cord injury) 10/11/2013   PCP:  Default, Provider, MD Pharmacy:  No Pharmacies Listed    Social Determinants of Health (SDOH) Interventions    Readmission Risk Interventions No flowsheet data found.

## 2018-04-11 NOTE — Progress Notes (Signed)
Clinical Social Worker facilitated patient discharge including contacting patient family and facility to confirm patient discharge plans. Clinical information faxed to facility and family agreeable with plan. CSW arranged ambulance transport via [facility] to [facility].  RN to call for report prior to discharge ([phone]).  Clinical Social Worker will sign off for now as social work intervention is no longer needed. Please consult [name] again if new need arises.  Mahamad Sadie Pickar LCSW  [phone]

## 2018-04-13 LAB — GLUCOSE, CAPILLARY: Glucose-Capillary: 184 mg/dL — ABNORMAL HIGH (ref 70–99)

## 2018-04-19 ENCOUNTER — Telehealth: Payer: Self-pay

## 2018-04-19 NOTE — Telephone Encounter (Signed)
I attempted to reach the pt and spoke with her mom , [name] ( ok per [facility]). I advised I was reaching out regarding her 10:30 6 month f/u with Dr. Jannifer Franklin on 04/20/18.   I advised, Due to current COVID 19 pandemic, our office is severely reducing in office visits for at least the next 2 weeks, in order to minimize the risk to our patients and healthcare providers.    Pt mom states Ms Zaun is currently in a nursing home and she is unsure when she will get out. She states to cancel pt and call back once restriction have lifted off of our office.

## 2018-04-20 ENCOUNTER — Ambulatory Visit: Payer: Medicare Other | Admitting: Neurology

## 2018-06-01 ENCOUNTER — Other Ambulatory Visit: Payer: Self-pay

## 2018-06-01 ENCOUNTER — Emergency Department (HOSPITAL_COMMUNITY)
Admission: EM | Admit: 2018-06-01 | Discharge: 2018-06-02 | Disposition: A | Discharge status: Home / Self Care | Payer: Medicare Other | Attending: Emergency Medicine | Admitting: Emergency Medicine

## 2018-06-01 ENCOUNTER — Encounter (HOSPITAL_COMMUNITY): Payer: Self-pay

## 2018-06-01 DIAGNOSIS — G8250 Quadriplegia, unspecified: Secondary | ICD-10-CM | POA: Insufficient documentation

## 2018-06-01 DIAGNOSIS — I509 Heart failure, unspecified: Secondary | ICD-10-CM | POA: Diagnosis not present

## 2018-06-01 DIAGNOSIS — F17210 Nicotine dependence, cigarettes, uncomplicated: Secondary | ICD-10-CM | POA: Diagnosis not present

## 2018-06-01 DIAGNOSIS — Z794 Long term (current) use of insulin: Secondary | ICD-10-CM | POA: Diagnosis not present

## 2018-06-01 DIAGNOSIS — Z8709 Personal history of other diseases of the respiratory system: Secondary | ICD-10-CM | POA: Diagnosis not present

## 2018-06-01 DIAGNOSIS — Z950 Presence of cardiac pacemaker: Secondary | ICD-10-CM | POA: Diagnosis not present

## 2018-06-01 DIAGNOSIS — Z430 Encounter for attention to tracheostomy: Secondary | ICD-10-CM | POA: Diagnosis not present

## 2018-06-01 DIAGNOSIS — Z79899 Other long term (current) drug therapy: Secondary | ICD-10-CM | POA: Diagnosis not present

## 2018-06-01 DIAGNOSIS — E119 Type 2 diabetes mellitus without complications: Secondary | ICD-10-CM | POA: Insufficient documentation

## 2018-06-01 NOTE — ED Triage Notes (Signed)
Pt BIB [facility] from [facility] for eval of dislodged trach. Pt w/ NRB in place over trach, unable to reinsert per EMS. No respiratory difficult, NARD, NAD. Aphasic at baseline

## 2018-06-01 NOTE — Procedures (Signed)
Tracheostomy Change Note  Patient Details:   Name: Linet Brash DOB: [birth year] MRN: [id number]    Airway Documentation: Patient arrived to ED with dislodged trach. Patient in no distress at this time. Patient currently has a 6 Shiley cuffless. RT was able to place new 6 Shiley cuffless in patient without difficulty.     Evaluation  O2 sats: stable throughout Complications: No apparent complications Patient did tolerate procedure well.      Kelle Darting 06/01/2018, 2325

## 2018-06-01 NOTE — ED Provider Notes (Signed)
[facility] EMERGENCY DEPARTMENT Provider Note   CSN: [id number] Arrival date & time: 06/01/18  2310    History   Chief Complaint Chief Complaint  Patient presents with  . Trach Dislodged    HPI Angela Page is a 48 y.o. female.     The history is provided by the patient and medical records.     48 y.o. F with hx of CHF, asthma, cocaine abuse, decubitus ulcers, GERD, DM, quadriplegia from traumatic fall in 2015, presenting to the ED after her trach accidentally got dislodged.  This occurred just PTA.  Patient states otherwise she has been well, no complaints.  She arrives with NRB over stoma.  Past Medical History:  Diagnosis Date  . Acute on chronic respiratory failure with hypoxia ([place])   . Anasarca 06/10/2016  . Asthma   . At high risk for severe sepsis   . Cocaine use 10/08/2013  . Decubitus ulcer of buttock, unstageable ([place])   . Depression   . Diabetes mellitus without complication ([place])   . GERD (gastroesophageal reflux disease)   . HCAP (healthcare-associated pneumonia) 06/09/2016  . Hepatitis    hx of hepatits frm mono   . Kidney stone   . Lobar pneumonia ([place])   . Overdose of opiate or related narcotic ([place]) 09/02/2014  . Pacemaker   . Pleurisy   . Polysubstance dependence including opioid type drug, episodic abuse ([place]) 10/27/2013  . Protein calorie malnutrition ([place]) 10/31/2015  . Quadriparesis ([place])   . Quadriplegia and quadriparesis ([place])   . Quadriplegia, C5-C7 incomplete ([place]) 10/08/2013  . Stage IV pressure ulcer of sacral region ([place]) 10/31/2015  . Tracheostomy status [facility])     Patient Active Problem List   Diagnosis Date Noted  . Chronic respiratory failure with hypoxia ([place])   . Encephalopathy acute 02/14/2018  . Sepsis ([place]) 01/31/2018  . Acute on chronic respiratory failure with hypoxia ([place])   . Quadriplegia and quadriparesis ([place])   . Lobar pneumonia ([place])   . At high risk for severe sepsis   . Tracheostomy  status ([place])   . Decubitus ulcer of buttock, unstageable ([place])   . Hyperglycemic hyperosmolar nonketotic coma ([place]) 12/16/2017  . Lactic acid acidosis 12/16/2017  . AKI (acute kidney injury) ([place]) 12/16/2017  . Suprapubic catheter ([place]) 12/16/2017  . [place] (hyperglycemic hyperosmolar nonketotic coma) ([place]) 12/16/2017  . Elevated lactic acid level   . Pressure injury of skin 07/17/2017  . Kidney stone 07/16/2017  . Right ureteral stone 03/10/2017  . Complicated UTI (urinary tract infection) 03/09/2017  . Ineffective airway clearance   . Sepsis secondary to UTI ([place]) 06/30/2016  . Autonomic neuropathy 06/10/2016  . Presence of permanent cardiac pacemaker 06/10/2016  . 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 Chronically on opiate therapy 06/10/2016  . Indwelling Foley catheter present 06/10/2016  . PEG (percutaneous endoscopic gastrostomy) status ([place]) 06/10/2016  . Pulmonary hypertension ([place]) 06/10/2016  . Hypoalbuminemia 06/10/2016  . Abnormal transaminases   . Peripheral edema   . Tracheostomy dependence ([place]oper[place])   . Chronic obstructive pulmonary disease ([place])   . Chronic diastolic CHF (congestive heart failure) ([place]) 01/23/2016  . Tracheostomy status ([place])   . Esophageal dysphagia   . Malnutrition of moderate degree 11/01/2015  . Tobacco abuse 10/31/2015  . Asthma 10/31/2015  . History of pneumonia 10/08/2015  . Pressure ulcer of contiguous region involving buttock and hip, stage 4 ([place]) 02/26/2015  . Hypotension 02/25/2015  . Acute metabolic encephalopathy 82/50/5397  .  Leukocytosis 10/23/2014  . Hypokalemia 10/23/2014  . Decubitus ulcer of sacral region, stage 4 ([place]avan) 10/23/2014  . Anemia, iron deficiency 10/23/2014  . Quadriplegia, C5-C7 incomplete ([place]hawk) 10/22/2014  . Depression   . Protein-calorie malnutrition, severe ([place]) 09/05/2014  . 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 Recurrent UTI 03/31/2014  . Pressure ulcer of coccygeal region,  stage 4 ([place]) 01/06/2014  . Stage 4 skin ulcer of sacral region (Thornville) 01/06/2014  . Neuropathic pain 11/30/2013  . Paraplegia following spinal cord injury ([place]) 11/30/2013  . Spinal cord injury, cervical region ([place]irie) 11/30/2013  . Chronic pain due to injury 11/30/2013  . S/P cervical spinal fusion 10/27/2013  . Tracheostomy care ([place]) 10/27/2013  . 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 SCI (spinal cord injury) 10/11/2013    Past Surgical History:  Procedure Laterality Date  . APPENDECTOMY    . BACK SURGERY  10/08/2013   fusion of spine, cervical region  . CARDIAC SURGERY    . CYSTOSCOPY W/ URETERAL STENT PLACEMENT Right 03/09/2017   Procedure: CYSTOSCOPY [name] STENT PLACEMENT;  Surgeon: Irine Seal, MD;  Location: [facility];  Service: Urology;  Laterality: Right;  . CYSTOSCOPY W/ URETERAL STENT REMOVAL Right 07/16/2017   Procedure: CYSTOSCOPY WITH STENT REMOVAL;  Surgeon: Franchot Gallo, MD;  Location: [facility];  Service: Urology;  Laterality: Right;  . CYSTOSCOPY/URETEROSCOPY/HOLMIUM LASER/STENT PLACEMENT Right 07/16/2017   Procedure: CYSTOSCOPY/URETEROSCOPY/HOLMIUM LASER/STENT PLACEMENT/ALSO STONE EXTRACTION;  Surgeon: Franchot Gallo, MD;  Location: [facility];  Service: Urology;  Laterality: Right;  . GASTROSTOMY  11/23/2015  . I&D EXTREMITY  10/28/2011   Procedure: IRRIGATION AND DEBRIDEMENT EXTREMITY;  Surgeon: Tennis Must, MD;  Location: [facility];  Service: Orthopedics;  Laterality: Right;  Irrigation and debridement right middle finger  . INSERTION OF SUPRAPUBIC CATHETER N/A 07/28/2016   Procedure: INSERTION OF SUPRAPUBIC CATHETER;  Surgeon: Franchot Gallo, MD;  Location: [facility];  Service: Urology;  Laterality: N/A;  . IR GENERIC HISTORICAL  11/23/2015   IR GASTROSTOMY TUBE MOD SED 11/23/2015 [facility]-INTER[facility]  . IR PATIENT EVAL TECH 0-60 MINS  12/18/2017  . IR REPLACE G-TUBE SIMPLE WO FLUORO  12/19/2017  . LITHOTRIPSY  08/2017  . TRACHEOSTOMY       OB  History   No obstetric history on file.      Home Medications    Prior to Admission medications   Medication Sig Start Date End Date Taking? Authorizing Provider  acetaminophen (TYLENOL) 500 MG tablet Take 1,000 mg by mouth every 6 (six) hours as needed for mild pain, moderate pain or headache (Fever >100.4).    [provider]  albuterol (PROVENTIL HFA;VENTOLIN HFA) 108 (90 Base) MCG/ACT inhaler Inhale 1-2 puffs into the lungs every 6 (six) hours as needed for wheezing or shortness of breath.    [provider]  Amino Acids-Protein Hydrolys (FEEDING SUPPLEMENT, PRO-STAT SUGAR FREE 64,) LIQD Place 30 mLs into feeding tube 2 (two) times daily. Patient taking differently: Take 30 mLs by mouth 2 (two) times daily.  02/17/18   Danford, Suann Larry, MD  baclofen (LIORESAL) 20 MG tablet Take 1 tablet (20 mg total) by mouth every 8 (eight) hours as needed for muscle spasms. 02/17/18   Danford, Suann Larry, MD  DULoxetine (CYMBALTA) 30 MG capsule Take 1 capsule (30 mg total) by mouth 2 (two) times daily. 02/17/18   Danford, Suann Larry, MD  famotidine (PEPCID) 40 MG/5ML suspension Place 2.5 mLs (20 mg total) into feeding tube at bedtime. Patient taking differently:  Take 20 mg by mouth at bedtime.  12/31/17   Arrien, Jimmy Picket, MD  ferrous sulfate 300 (60 Fe) MG/5ML syrup Place 3.7 mLs (220 mg total) into feeding tube daily with breakfast. 04/11/18   Kayleen Memos, DO  furosemide (LASIX) 40 MG tablet Place 1 tablet (40 mg total) into feeding tube daily. Patient taking differently: Take 40 mg by mouth daily.  01/01/18 04/05/18  Arrien, Jimmy Picket, MD  gabapentin (NEURONTIN) 100 MG capsule Take 1 capsule (100 mg total) by mouth 3 (three) times daily. Patient taking differently: Take 300 mg by mouth 3 (three) times daily.  02/17/18   Danford, Suann Larry, MD  guaiFENesin (ROBITUSSIN) 100 MG/5ML liquid Take 300 mg by mouth every 6 (six) hours as needed for cough.      [provider]  Heparin Sodium, Porcine, PF 5000 UNIT/ML SOLN Inject 5,000 Units as directed every 8 (eight) hours.     [provider]  insulin aspart (NOVOLOG) 100 UNIT/ML injection Inject 10 Units into the skin every 4 (four) hours. For glucose 150 to 200 use 1 units, for glucose 201-250 use 2 units, for 251 to 300 use 4 units, for 301 to 350 use 6 units, for 351 to 400 use 8 units, for 401 or greater use 10 units. Patient taking differently: Inject 0-10 Units into the skin See admin instructions. Check FSBS every 6 hours. Give insulin according to sliding scale: If BS is 0-150 = 0u, 151-200 = 2u, 201-250 = 4u, 251-300 = 6u, 301-350 = 8u, 351-400 = 10u. >400 call MD 12/31/17   Arrien, Jimmy Picket, MD  Insulin Glargine (BASAGLAR KWIKPEN) 100 UNIT/ML SOPN Inject 0.05 mLs (5 Units total) into the skin at bedtime. 04/11/18   Kayleen Memos, DO  ipratropium-albuterol (DUONEB) 0.5-2.5 (3) MG/3ML SOLN Take 3 mLs by nebulization every 6 (six) hours as needed (SOB).    [provider]  LORazepam (ATIVAN) 0.5 MG tablet Take 1 tablet (0.5 mg total) by mouth every 8 (eight) hours as needed for anxiety. Patient taking differently: Take 0.5 mg by mouth every 8 (eight) hours.  02/18/18   Danford, Suann Larry, MD  metoCLOPramide (REGLAN) 5 MG tablet Take 5 mg by mouth 4 (four) times daily. Via G-tube     [provider]  midodrine (PROAMATINE) 10 MG tablet Take 10 mg by mouth 3 (three) times daily.    [provider]  Nutritional Supplements (FEEDING SUPPLEMENT, GLUCERNA 1.2 CAL,) LIQD Place 1,000 mLs into feeding tube daily. 02/18/18   Danford, Suann Larry, MD  ondansetron (ZOFRAN) 4 MG tablet Take 4 mg by mouth every 6 (six) hours as needed for nausea or vomiting.    [provider]  oxyCODONE (OXY IR/ROXICODONE) 5 MG immediate release tablet Place 1 tablet (5 mg total) into feeding tube every 4 (four) hours as needed (breakthrough pain). Patient taking  differently: Take 5 mg by mouth every 4 (four) hours as needed (breakthrough pain).  02/18/18   Danford, Suann Larry, MD  OXYGEN Inhale 3 L into the lungs continuous.    [provider]  polyethylene glycol (MIRALAX / GLYCOLAX) packet Take 17 g by mouth daily. Hold if diarrhea. 01/01/18   Arrien, Jimmy Picket, MD  senna-docusate (SENOKOT-S) 8.6-50 MG tablet Take 2 tablets by mouth at bedtime as needed for mild constipation.    [provider]  sodium chloride (OCEAN) 0.65 % SOLN nasal spray Place 2 sprays into both nostrils daily as needed for congestion.  [provider]  SODIUM CHLORIDE PO Take 500 mg by mouth 2 (two) times daily.    [provider]  temazepam (RESTORIL) 7.5 MG capsule Take 1 capsule (7.5 mg total) by mouth at bedtime as needed for sleep. Patient taking differently: Take 7.5 mg by mouth at bedtime.  02/18/18   Edwin Dada, MD  tiotropium (SPIRIVA) 18 MCG inhalation capsule Place 18 mcg into inhaler and inhale daily.    [provider]    Family History Family History  Problem Relation Age of Onset  . Hypertension Maternal Uncle   . Kidney failure Maternal Uncle   . Colon cancer Mother   . Lung cancer Father   . Hypertension Brother   . Kidney failure Maternal Aunt   . Kidney failure Maternal Uncle   . Colon cancer Maternal Grandfather   . Esophageal cancer Neg Hx     Social History Social History   Tobacco Use  . Smoking status: Current Every Day Smoker    Packs/day: 1.00    Years: 25.00    Pack years: 25.00    Types: Cigarettes  . Smokeless tobacco: Never Used  Substance Use Topics  . Alcohol use: Never    Frequency: Never  . Drug use: No     Allergies   Penicillins; Erythromycin; Nitrofurantoin monohyd macro; and Oxybutynin   Review of Systems Review of Systems  Constitutional:       Trach dislodged  All other systems reviewed and are negative.    Physical Exam Updated Vital Signs  BP (!) 152/85 (BP Location: Left Arm)   Pulse 80   Temp 99.8 F (37.7 C) (Oral)   Resp 16   Wt 46 kg   LMP 05/27/2016 (Within Days) Comment: Irregular periods since October 2015.  SpO2 100%   BMI 18.55 kg/m   Physical Exam Vitals signs and nursing note reviewed.  Constitutional:      Appearance: She is well-developed.  HENT:     Head: Normocephalic and atraumatic.     Mouth/Throat:     Comments: Stoma is open, some secretions, no evidence of bleeding Eyes:     Conjunctiva/sclera: Conjunctivae normal.     Pupils: Pupils are equal, round, and reactive to light.  Neck:     Musculoskeletal: Normal range of motion.  Cardiovascular:     Rate and Rhythm: Normal rate and regular rhythm.     Heart sounds: Normal heart sounds.  Pulmonary:     Effort: Pulmonary effort is normal. No respiratory distress.     Breath sounds: Normal breath sounds. No stridor. No wheezing.  Abdominal:     General: Bowel sounds are normal.     Palpations: Abdomen is soft.  Musculoskeletal: Normal range of motion.  Skin:    General: Skin is warm and dry.  Neurological:     Mental Status: She is alert and oriented to person, place, and time.      ED Treatments / Results  Labs (all labs ordered are listed, but only abnormal results are displayed) Labs Reviewed - No data to display  EKG None  Radiology No results found.  Procedures Procedures (including critical care time)  Medications Ordered in ED Medications - No data to display   Initial Impression / Assessment and Plan / ED Course  I have reviewed the triage vital signs and the nursing notes.  Pertinent labs & imaging results that were available during my care of the patient were reviewed by me and considered in  my medical decision making (see chart for details).  48 y.o. F who is trach dependent, presenting to the ED from SNF after her trach became dislodged just PTA.  States she has otherwise been well, no complaints.  RT replaced  trach without issue, patient was suctioned.  No further complaints.  Vitals are stable.  Appropriate for discharge back to facility.  Final Clinical Impressions(s) / ED Diagnoses   Final diagnoses:  Encounter for tracheostomy tube change [facility])    ED Discharge Orders    None       Larene Pickett, PA-C 06/02/18 0003    Palumbo, April, MD 06/02/18 [id number]

## 2018-06-02 NOTE — ED Notes (Signed)
[facility] for transport.

## 2018-06-03 ENCOUNTER — Other Ambulatory Visit: Payer: Self-pay

## 2018-06-03 ENCOUNTER — Encounter (HOSPITAL_COMMUNITY): Payer: Self-pay | Admitting: Emergency Medicine

## 2018-06-03 ENCOUNTER — Emergency Department (HOSPITAL_COMMUNITY)
Admission: EM | Admit: 2018-06-03 | Discharge: 2018-06-03 | Disposition: A | Discharge status: Home / Self Care | Payer: Medicare Other | Attending: Emergency Medicine | Admitting: Emergency Medicine

## 2018-06-03 ENCOUNTER — Ambulatory Visit (HOSPITAL_COMMUNITY): Payer: Medicare Other

## 2018-06-03 DIAGNOSIS — F17210 Nicotine dependence, cigarettes, uncomplicated: Secondary | ICD-10-CM | POA: Diagnosis not present

## 2018-06-03 DIAGNOSIS — Z431 Encounter for attention to gastrostomy: Secondary | ICD-10-CM | POA: Insufficient documentation

## 2018-06-03 DIAGNOSIS — I5032 Chronic diastolic (congestive) heart failure: Secondary | ICD-10-CM | POA: Insufficient documentation

## 2018-06-03 DIAGNOSIS — G8254 Quadriplegia, C5-C7 incomplete: Secondary | ICD-10-CM | POA: Diagnosis not present

## 2018-06-03 DIAGNOSIS — K9423 Gastrostomy malfunction: Secondary | ICD-10-CM

## 2018-06-03 DIAGNOSIS — Z79899 Other long term (current) drug therapy: Secondary | ICD-10-CM | POA: Insufficient documentation

## 2018-06-03 DIAGNOSIS — Z7901 Long term (current) use of anticoagulants: Secondary | ICD-10-CM | POA: Insufficient documentation

## 2018-06-03 DIAGNOSIS — Z794 Long term (current) use of insulin: Secondary | ICD-10-CM | POA: Diagnosis not present

## 2018-06-03 DIAGNOSIS — E119 Type 2 diabetes mellitus without complications: Secondary | ICD-10-CM | POA: Insufficient documentation

## 2018-06-03 NOTE — ED Notes (Signed)
Bed: HK32 Expected date:  Expected time:  Means of arrival:  Comments: EMS-peg tube clogged

## 2018-06-03 NOTE — ED Provider Notes (Signed)
[facility] DEPT Provider Note   CSN: [id number] Arrival date & time: 06/03/18  4098    History   Chief Complaint Chief Complaint  Patient presents with  . G-Tube Issue    HPI Angela Page is a 48 y.o. female.     48 yo F with a chief complaint of her G-tube being broken.  Patient states that it got clogged and her home health nurse was trying hard to dislodge it and it ruptured the tube.  She was here couple days ago for her tracheostomy tube falling out.  She has had no issues with that since.  She uses the G-tube for supplemental feeds.  Takes her medicines and is able to eat and drink.  She denies any other issues.  The history is provided by the patient.  Illness  Severity:  Mild Onset quality:  Sudden Duration:  1 day Timing:  Constant Progression:  Unchanged Chronicity:  New Associated symptoms: no chest pain, no congestion, no fever, no headaches, no myalgias, no nausea, no rhinorrhea, no shortness of breath, no vomiting and no wheezing     Past Medical History:  Diagnosis Date  . Acute on chronic respiratory failure with hypoxia ([place])   . Anasarca 06/10/2016  . Asthma   . At high risk for severe sepsis   . Cocaine use 10/08/2013  . Decubitus ulcer of buttock, unstageable ([place])   . Depression   . Diabetes mellitus without complication ([place])   . GERD (gastroesophageal reflux disease)   . HCAP (healthcare-associated pneumonia) 06/09/2016  . Hepatitis    hx of hepatits frm mono   . Kidney stone   . Lobar pneumonia ([place])   . Overdose of opiate or related narcotic ([place]) 09/02/2014  . Pacemaker   . Pleurisy   . Polysubstance dependence including opioid type drug, episodic abuse ([place]) 10/27/2013  . Protein calorie malnutrition ([place]) 10/31/2015  . Quadriparesis ([place])   . Quadriplegia and quadriparesis ([place])   . Quadriplegia, C5-C7 incomplete ([place]) 10/08/2013  . Stage IV pressure ulcer of sacral region ([place]) 10/31/2015  .  Tracheostomy status [facility])     Patient Active Problem List   Diagnosis Date Noted  . Chronic respiratory failure with hypoxia ([place])   . Encephalopathy acute 02/14/2018  . Sepsis ([place]) 01/31/2018  . Acute on chronic respiratory failure with hypoxia ([place])   . Quadriplegia and quadriparesis ([place])   . Lobar pneumonia ([place])   . At high risk for severe sepsis   . Tracheostomy status ([place])   . Decubitus ulcer of buttock, unstageable ([place])   . Hyperglycemic hyperosmolar nonketotic coma ([place]) 12/16/2017  . Lactic acid acidosis 12/16/2017  . AKI (acute kidney injury) ([place]) 12/16/2017  . Suprapubic catheter ([place]) 12/16/2017  . [place] (hyperglycemic hyperosmolar nonketotic coma) ([place]) 12/16/2017  . Elevated lactic acid level   . Pressure injury of skin 07/17/2017  . Kidney stone 07/16/2017  . Right ureteral stone 03/10/2017  . Complicated UTI (urinary tract infection) 03/09/2017  . Ineffective airway clearance   . Sepsis secondary to UTI ([place]) 06/30/2016  . Autonomic neuropathy 06/10/2016  . Presence of permanent cardiac pacemaker 06/10/2016  . 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 Chronically on opiate therapy 06/10/2016  . Indwelling Foley catheter present 06/10/2016  . PEG (percutaneous endoscopic gastrostomy) status ([place]) 06/10/2016  . Pulmonary hypertension ([place]) 06/10/2016  . Hypoalbuminemia 06/10/2016  . Abnormal transaminases   . Peripheral edema   . Tracheostomy dependence ([place])   . Chronic  obstructive pulmonary disease ([place])   . Chronic diastolic CHF (congestive heart failure) ([place]) 01/23/2016  . Tracheostomy status ([place])   . Esophageal dysphagia   . Malnutrition of moderate degree 11/01/2015  . Tobacco abuse 10/31/2015  . Asthma 10/31/2015  . History of pneumonia 10/08/2015  . Pressure ulcer of contiguous region involving buttock and hip, stage 4 ([place]) 02/26/2015  . Hypotension 02/25/2015  . Acute metabolic encephalopathy 01/12/8249  . Leukocytosis  10/23/2014  . Hypokalemia 10/23/2014  . Decubitus ulcer of sacral region, stage 4 ([place]) 10/23/2014  . Anemia, iron deficiency 10/23/2014  . Quadriplegia, C5-C7 incomplete ([place]) 10/22/2014  . Depression   . Protein-calorie malnutrition, severe ([place]) 09/05/2014  . 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 Recurrent UTI 03/31/2014  . Pressure ulcer of coccygeal region, stage 4 ([place]) 01/06/2014  . Stage 4 skin ulcer of sacral region ([place]) 01/06/2014  . Neuropathic pain 11/30/2013  . Paraplegia following spinal cord injury ([place]) 11/30/2013  . Spinal cord injury, cervical region ([place]) 11/30/2013  . Chronic pain due to injury 11/30/2013  . S/P cervical spinal fusion 10/27/2013  . Tracheostomy care ([place]) 10/27/2013  . 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 SCI (spinal cord injury) 10/11/2013    Past Surgical History:  Procedure Laterality Date  . APPENDECTOMY    . BACK SURGERY  10/08/2013   fusion of spine, cervical region  . CARDIAC SURGERY    . CYSTOSCOPY W/ URETERAL STENT PLACEMENT Right 03/09/2017   Procedure: CYSTOSCOPY [name] STENT PLACEMENT;  Surgeon: Irine Seal, MD;  Location: [facility];  Service: Urology;  Laterality: Right;  . CYSTOSCOPY W/ URETERAL STENT REMOVAL Right 07/16/2017   Procedure: CYSTOSCOPY WITH STENT REMOVAL;  Surgeon: Franchot Gallo, MD;  Location: [facility];  Service: Urology;  Laterality: Right;  . CYSTOSCOPY/URETEROSCOPY/HOLMIUM LASER/STENT PLACEMENT Right 07/16/2017   Procedure: CYSTOSCOPY/URETEROSCOPY/HOLMIUM LASER/STENT PLACEMENT/ALSO STONE EXTRACTION;  Surgeon: Franchot Gallo, MD;  Location: [facility];  Service: Urology;  Laterality: Right;  . GASTROSTOMY  11/23/2015  . I&D EXTREMITY  10/28/2011   Procedure: IRRIGATION AND DEBRIDEMENT EXTREMITY;  Surgeon: Tennis Must, MD;  Location: [facility];  Service: Orthopedics;  Laterality: Right;  Irrigation and debridement right middle finger  . INSERTION OF SUPRAPUBIC CATHETER N/A  07/28/2016   Procedure: INSERTION OF SUPRAPUBIC CATHETER;  Surgeon: Franchot Gallo, MD;  Location: [facility];  Service: Urology;  Laterality: N/A;  . IR GENERIC HISTORICAL  11/23/2015   IR GASTROSTOMY TUBE MOD SED 11/23/2015 [facility]  . IR PATIENT EVAL TECH 0-60 MINS  12/18/2017  . IR REPLACE G-TUBE SIMPLE WO FLUORO  12/19/2017  . LITHOTRIPSY  08/2017  . TRACHEOSTOMY       OB History   No obstetric history on file.      Home Medications    Prior to Admission medications   Medication Sig Start Date End Date Taking? Authorizing Provider  acetaminophen (TYLENOL) 500 MG tablet Take 1,000 mg by mouth every 6 (six) hours as needed for mild pain, moderate pain or headache (Fever >100.4).    [provider]  albuterol (PROVENTIL HFA;VENTOLIN HFA) 108 (90 Base) MCG/ACT inhaler Inhale 1-2 puffs into the lungs every 6 (six) hours as needed for wheezing or shortness of breath.    [provider]  Amino Acids-Protein Hydrolys (FEEDING SUPPLEMENT, PRO-STAT SUGAR FREE 64,) LIQD Place 30 mLs into feeding tube 2 (two) times daily. Patient taking differently: Take 30 mLs by mouth 2 (two) times daily.  02/17/18   Danford, Suann Larry, MD  baclofen (LIORESAL) 20 MG tablet Take 1 tablet (20 mg total) by mouth every 8 (eight) hours as needed for muscle spasms. 02/17/18   Danford, Suann Larry, MD  DULoxetine (CYMBALTA) 30 MG capsule Take 1 capsule (30 mg total) by mouth 2 (two) times daily. 02/17/18   [place]ford, Suann Larry, MD  famotidine (PEPCID) 40 MG/5ML suspension Place 2.5 mLs (20 mg total) into feeding tube at bedtime. Patient taking differently: Take 20 mg by mouth at bedtime.  12/31/17   Arrien, Jimmy Picket, MD  ferrous sulfate 300 (60 Fe) MG/5ML syrup Place 3.7 mLs (220 mg total) into feeding tube daily with breakfast. 04/11/18   Kayleen Memos, DO  furosemide (LASIX) 40 MG tablet Place 1 tablet (40 mg total) into feeding tube daily. Patient taking differently: Take 40 mg by  mouth daily.  01/01/18 04/05/18  Arrien, Jimmy Picket, MD  gabapentin (NEURONTIN) 100 MG capsule Take 1 capsule (100 mg total) by mouth 3 (three) times daily. Patient taking differently: Take 300 mg by mouth 3 (three) times daily.  02/17/18   Danford, Suann Larry, MD  guaiFENesin (ROBITUSSIN) 100 MG/5ML liquid Take 300 mg by mouth every 6 (six) hours as needed for cough.     [provider]  Heparin Sodium, Porcine, PF 5000 UNIT/ML SOLN Inject 5,000 Units as directed every 8 (eight) hours.     [provider]  insulin aspart (NOVOLOG) 100 UNIT/ML injection Inject 10 Units into the skin every 4 (four) hours. For glucose 150 to 200 use 1 units, for glucose 201-250 use 2 units, for 251 to 300 use 4 units, for 301 to 350 use 6 units, for 351 to 400 use 8 units, for 401 or greater use 10 units. Patient taking differently: Inject 0-10 Units into the skin See admin instructions. Check FSBS every 6 hours. Give insulin according to sliding scale: If BS is 0-150 = 0u, 151-200 = 2u, 201-250 = 4u, 251-300 = 6u, 301-350 = 8u, 351-400 = 10u. >400 call MD 12/31/17   Arrien, Jimmy Picket, MD  Insulin Glargine (BASAGLAR KWIKPEN) 100 UNIT/ML SOPN Inject 0.05 mLs (5 Units total) into the skin at bedtime. 04/11/18   Kayleen Memos, DO  ipratropium-albuterol (DUONEB) 0.5-2.5 (3) MG/3ML SOLN Take 3 mLs by nebulization every 6 (six) hours as needed (SOB).    [provider]  LORazepam (ATIVAN) 0.5 MG tablet Take 1 tablet (0.5 mg total) by mouth every 8 (eight) hours as needed for anxiety. Patient taking differently: Take 0.5 mg by mouth every 8 (eight) hours.  02/18/18   Danford, Suann Larry, MD  metoCLOPramide (REGLAN) 5 MG tablet Take 5 mg by mouth 4 (four) times daily. Via G-tube     [provider]  midodrine (PROAMATINE) 10 MG tablet Take 10 mg by mouth 3 (three) times daily.    [provider]  Nutritional Supplements (FEEDING SUPPLEMENT, GLUCERNA 1.2 CAL,) LIQD Place  1,000 mLs into feeding tube daily. 02/18/18   Danford, Suann Larry, MD  ondansetron (ZOFRAN) 4 MG tablet Take 4 mg by mouth every 6 (six) hours as needed for nausea or vomiting.    [provider]  oxyCODONE (OXY IR/ROXICODONE) 5 MG immediate release tablet Place 1 tablet (5 mg total) into feeding tube every 4 (four) hours as needed (breakthrough pain). Patient taking differently: Take 5 mg by mouth every 4 (four) hours as needed (breakthrough pain).  02/18/18   Danford, Suann Larry, MD  OXYGEN Inhale 3 L into the lungs continuous.  [provider]  polyethylene glycol (MIRALAX / GLYCOLAX) packet Take 17 g by mouth daily. Hold if diarrhea. 01/01/18   Arrien, Jimmy Picket, MD  senna-docusate (SENOKOT-S) 8.6-50 MG tablet Take 2 tablets by mouth at bedtime as needed for mild constipation.    [provider]  sodium chloride (OCEAN) 0.65 % SOLN nasal spray Place 2 sprays into both nostrils daily as needed for congestion.    [provider]  SODIUM CHLORIDE PO Take 500 mg by mouth 2 (two) times daily.    [provider]  temazepam (RESTORIL) 7.5 MG capsule Take 1 capsule (7.5 mg total) by mouth at bedtime as needed for sleep. Patient taking differently: Take 7.5 mg by mouth at bedtime.  02/18/18   Edwin Dada, MD  tiotropium (SPIRIVA) 18 MCG inhalation capsule Place 18 mcg into inhaler and inhale daily.    [provider]    Family History Family History  Problem Relation Age of Onset  . Hypertension Maternal Uncle   . Kidney failure Maternal Uncle   . Colon cancer Mother   . Lung cancer Father   . Hypertension Brother   . Kidney failure Maternal Aunt   . Kidney failure Maternal Uncle   . Colon cancer Maternal Grandfather   . Esophageal cancer Neg Hx     Social History Social History   Tobacco Use  . Smoking status: Current Every Day Smoker    Packs/day: 1.00    Years: 25.00    Pack years: 25.00    Types: Cigarettes   . Smokeless tobacco: Never Used  Substance Use Topics  . Alcohol use: Never    Frequency: Never  . Drug use: No     Allergies   Penicillins; Erythromycin; Nitrofurantoin monohyd macro; and Oxybutynin   Review of Systems Review of Systems  Constitutional: Negative for chills and fever.  HENT: Negative for congestion and rhinorrhea.   Eyes: Negative for redness and visual disturbance.  Respiratory: Negative for shortness of breath and wheezing.   Cardiovascular: Negative for chest pain and palpitations.  Gastrointestinal: Negative for nausea and vomiting.  Genitourinary: Negative for dysuria and urgency.  Musculoskeletal: Negative for arthralgias and myalgias.  Skin: Negative for pallor and wound.  Neurological: Negative for dizziness and headaches.     Physical Exam Updated Vital Signs BP 101/78 (BP Location: Right Arm)   Pulse 80   Temp 98.1 F (36.7 C) (Oral)   Resp 17   Ht 5\' 2"  (1.575 m)   Wt 46 kg   LMP 05/27/2016 (Within Days) Comment: Irregular periods since October 2015.  SpO2 97%   BMI 18.55 kg/m   Physical Exam Vitals signs and nursing note reviewed.  Constitutional:      General: She is not in acute distress.    Appearance: She is well-developed. She is not diaphoretic.  HENT:     Head: Normocephalic and atraumatic.     Comments: Tracheostomy tube in place.  Eyes:     Pupils: Pupils are equal, round, and reactive to light.  Neck:     Musculoskeletal: Normal range of motion and neck supple.  Cardiovascular:     Rate and Rhythm: Normal rate and regular rhythm.     Heart sounds: No murmur. No friction rub. No gallop.   Pulmonary:     Effort: Pulmonary effort is normal.     Breath sounds: No wheezing or rales.  Abdominal:     General: There is no distension.     Palpations:  Abdomen is soft.     Tenderness: There is no abdominal tenderness.     Comments: G tube in place, ruptured tubing proximally.   Musculoskeletal:        General: No  tenderness.  Skin:    General: Skin is warm and dry.  Neurological:     Mental Status: She is alert and oriented to person, place, and time.  Psychiatric:        Behavior: Behavior normal.      ED Treatments / Results  Labs (all labs ordered are listed, but only abnormal results are displayed) Labs Reviewed - No data to display  EKG None  Radiology No results found.  Procedures Gastrostomy tube replacement Date/Time: 06/03/2018 10:43 AM Performed by: Deno Etienne, DO Authorized by: Deno Etienne, DO  Consent: Verbal consent obtained. Risks and benefits: risks, benefits and alternatives were discussed Consent given by: patient Patient understanding: patient states understanding of the procedure being performed Patient consent: the patient's understanding of the procedure matches consent given Imaging studies: imaging studies not available Required items: required blood products, implants, devices, and special equipment available Patient identity confirmed: verbally with patient Local anesthesia used: no  Anesthesia: Local anesthesia used: no  Sedation: Patient sedated: no  Patient tolerance: Patient tolerated the procedure well with no immediate complications    (including critical care time)  Medications Ordered in ED Medications - No data to display   Initial Impression / Assessment and Plan / ED Course  I have reviewed the triage vital signs and the nursing notes.  Pertinent labs & imaging results that were available during my care of the patient were reviewed by me and considered in my medical decision making (see chart for details).        48 yo F with a cc of a G tube issue.  Was clogged now ruptured.  Will replace.  Tube was replaced with no discomfort and easily flushes with gastric contents.  Do not feel that imaging is needed.  Discharge home.  10:44 AM:  I have discussed the diagnosis/risks/treatment options with the patient and believe the pt to  be eligible for discharge home to follow-up with PCP. We also discussed returning to the ED immediately if new or worsening sx occur. We discussed the sx which are most concerning (e.g., sudden worsening pain, fever, inability to tolerate by mouth) that necessitate immediate return. Medications administered to the patient during their visit and any new prescriptions provided to the patient are listed below.  Medications given during this visit Medications - No data to display   The patient appears reasonably screen and/or stabilized for discharge and I doubt any other medical condition or other [facility] requiring further screening, evaluation, or treatment in the ED at this time prior to discharge.    Final Clinical Impressions(s) / ED Diagnoses   Final diagnoses:  Gastrostomy tube dysfunction [facility])    ED Discharge Orders    None       Deno Etienne, DO 06/03/18 1044

## 2018-06-03 NOTE — ED Notes (Signed)
Patient unable to sign discharge paperwork due to disability. Pt aware of discharge instructions and verbalized understanding.

## 2018-06-03 NOTE — ED Triage Notes (Signed)
Patient arrived by EMS from [facility]. Pt has tear in g-tube. Patient not c/o any pain. Nursing staff at [facility] had trouble when flushing tube.   EMS VS BP 161/97, HR 86,  CBG 273, T 97.3 F.

## 2018-06-22 ENCOUNTER — Emergency Department (HOSPITAL_COMMUNITY): Payer: Medicare Other

## 2018-06-22 ENCOUNTER — Encounter (HOSPITAL_COMMUNITY): Payer: Self-pay | Admitting: Emergency Medicine

## 2018-06-22 ENCOUNTER — Other Ambulatory Visit: Payer: Self-pay

## 2018-06-22 ENCOUNTER — Inpatient Hospital Stay (HOSPITAL_COMMUNITY)
Admission: EM | Admit: 2018-06-22 | Discharge: 2018-06-27 | DRG: 698 | Disposition: A | Discharge status: Skilled Nursing Facility | Payer: Medicare Other | Attending: Internal Medicine | Admitting: Internal Medicine

## 2018-06-22 DIAGNOSIS — E1143 Type 2 diabetes mellitus with diabetic autonomic (poly)neuropathy: Secondary | ICD-10-CM | POA: Diagnosis present

## 2018-06-22 DIAGNOSIS — L89899 Pressure ulcer of other site, unspecified stage: Secondary | ICD-10-CM | POA: Diagnosis present

## 2018-06-22 DIAGNOSIS — F17210 Nicotine dependence, cigarettes, uncomplicated: Secondary | ICD-10-CM | POA: Diagnosis present

## 2018-06-22 DIAGNOSIS — R4701 Aphasia: Secondary | ICD-10-CM | POA: Diagnosis present

## 2018-06-22 DIAGNOSIS — Z79891 Long term (current) use of opiate analgesic: Secondary | ICD-10-CM

## 2018-06-22 DIAGNOSIS — R1310 Dysphagia, unspecified: Secondary | ICD-10-CM | POA: Diagnosis not present

## 2018-06-22 DIAGNOSIS — G8250 Quadriplegia, unspecified: Secondary | ICD-10-CM | POA: Diagnosis present

## 2018-06-22 DIAGNOSIS — D75839 Thrombocytosis, unspecified: Secondary | ICD-10-CM | POA: Diagnosis present

## 2018-06-22 DIAGNOSIS — D649 Anemia, unspecified: Secondary | ICD-10-CM | POA: Diagnosis present

## 2018-06-22 DIAGNOSIS — Z8249 Family history of ischemic heart disease and other diseases of the circulatory system: Secondary | ICD-10-CM

## 2018-06-22 DIAGNOSIS — Z931 Gastrostomy status: Secondary | ICD-10-CM

## 2018-06-22 DIAGNOSIS — Z87442 Personal history of urinary calculi: Secondary | ICD-10-CM

## 2018-06-22 DIAGNOSIS — B9562 Methicillin resistant Staphylococcus aureus infection as the cause of diseases classified elsewhere: Secondary | ICD-10-CM | POA: Diagnosis present

## 2018-06-22 DIAGNOSIS — F329 Major depressive disorder, single episode, unspecified: Secondary | ICD-10-CM | POA: Diagnosis present

## 2018-06-22 DIAGNOSIS — G8254 Quadriplegia, C5-C7 incomplete: Secondary | ICD-10-CM | POA: Diagnosis present

## 2018-06-22 DIAGNOSIS — G9340 Encephalopathy, unspecified: Secondary | ICD-10-CM

## 2018-06-22 DIAGNOSIS — D509 Iron deficiency anemia, unspecified: Secondary | ICD-10-CM | POA: Diagnosis not present

## 2018-06-22 DIAGNOSIS — Z681 Body mass index (BMI) 19 or less, adult: Secondary | ICD-10-CM | POA: Diagnosis not present

## 2018-06-22 DIAGNOSIS — D473 Essential (hemorrhagic) thrombocythemia: Secondary | ICD-10-CM | POA: Diagnosis present

## 2018-06-22 DIAGNOSIS — F32A Depression, unspecified: Secondary | ICD-10-CM | POA: Diagnosis present

## 2018-06-22 DIAGNOSIS — G909 Disorder of the autonomic nervous system, unspecified: Secondary | ICD-10-CM | POA: Diagnosis present

## 2018-06-22 DIAGNOSIS — I495 Sick sinus syndrome: Secondary | ICD-10-CM | POA: Diagnosis present

## 2018-06-22 DIAGNOSIS — J9620 Acute and chronic respiratory failure, unspecified whether with hypoxia or hypercapnia: Secondary | ICD-10-CM | POA: Diagnosis present

## 2018-06-22 DIAGNOSIS — E8809 Other disorders of plasma-protein metabolism, not elsewhere classified: Secondary | ICD-10-CM | POA: Diagnosis present

## 2018-06-22 DIAGNOSIS — D72829 Elevated white blood cell count, unspecified: Secondary | ICD-10-CM | POA: Diagnosis present

## 2018-06-22 DIAGNOSIS — F419 Anxiety disorder, unspecified: Secondary | ICD-10-CM | POA: Diagnosis present

## 2018-06-22 DIAGNOSIS — T83518A Infection and inflammatory reaction due to other urinary catheter, initial encounter: Principal | ICD-10-CM | POA: Diagnosis present

## 2018-06-22 DIAGNOSIS — A419 Sepsis, unspecified organism: Secondary | ICD-10-CM | POA: Diagnosis present

## 2018-06-22 DIAGNOSIS — J45909 Unspecified asthma, uncomplicated: Secondary | ICD-10-CM | POA: Diagnosis present

## 2018-06-22 DIAGNOSIS — E1165 Type 2 diabetes mellitus with hyperglycemia: Secondary | ICD-10-CM | POA: Diagnosis present

## 2018-06-22 DIAGNOSIS — J449 Chronic obstructive pulmonary disease, unspecified: Secondary | ICD-10-CM | POA: Diagnosis present

## 2018-06-22 DIAGNOSIS — R1314 Dysphagia, pharyngoesophageal phase: Secondary | ICD-10-CM | POA: Diagnosis present

## 2018-06-22 DIAGNOSIS — Z87440 Personal history of urinary (tract) infections: Secondary | ICD-10-CM

## 2018-06-22 DIAGNOSIS — K219 Gastro-esophageal reflux disease without esophagitis: Secondary | ICD-10-CM | POA: Diagnosis present

## 2018-06-22 DIAGNOSIS — E872 Acidosis, unspecified: Secondary | ICD-10-CM | POA: Diagnosis present

## 2018-06-22 DIAGNOSIS — R9431 Abnormal electrocardiogram [ECG] [EKG]: Secondary | ICD-10-CM | POA: Diagnosis present

## 2018-06-22 DIAGNOSIS — J42 Unspecified chronic bronchitis: Secondary | ICD-10-CM | POA: Diagnosis not present

## 2018-06-22 DIAGNOSIS — G9341 Metabolic encephalopathy: Secondary | ICD-10-CM | POA: Diagnosis present

## 2018-06-22 DIAGNOSIS — Z800 Family history of malignant neoplasm of digestive organs: Secondary | ICD-10-CM

## 2018-06-22 DIAGNOSIS — I9589 Other hypotension: Secondary | ICD-10-CM | POA: Diagnosis present

## 2018-06-22 DIAGNOSIS — Z9359 Other cystostomy status: Secondary | ICD-10-CM

## 2018-06-22 DIAGNOSIS — E43 Unspecified severe protein-calorie malnutrition: Secondary | ICD-10-CM | POA: Diagnosis present

## 2018-06-22 DIAGNOSIS — L89154 Pressure ulcer of sacral region, stage 4: Secondary | ICD-10-CM | POA: Diagnosis present

## 2018-06-22 DIAGNOSIS — G8929 Other chronic pain: Secondary | ICD-10-CM | POA: Diagnosis present

## 2018-06-22 DIAGNOSIS — F331 Major depressive disorder, recurrent, moderate: Secondary | ICD-10-CM | POA: Diagnosis not present

## 2018-06-22 DIAGNOSIS — Z930 Tracheostomy status: Secondary | ICD-10-CM | POA: Diagnosis not present

## 2018-06-22 DIAGNOSIS — J9621 Acute and chronic respiratory failure with hypoxia: Secondary | ICD-10-CM | POA: Diagnosis present

## 2018-06-22 DIAGNOSIS — R1319 Other dysphagia: Secondary | ICD-10-CM

## 2018-06-22 DIAGNOSIS — E876 Hypokalemia: Secondary | ICD-10-CM | POA: Diagnosis present

## 2018-06-22 DIAGNOSIS — Z950 Presence of cardiac pacemaker: Secondary | ICD-10-CM | POA: Diagnosis present

## 2018-06-22 DIAGNOSIS — G8220 Paraplegia, unspecified: Secondary | ICD-10-CM | POA: Diagnosis not present

## 2018-06-22 DIAGNOSIS — N390 Urinary tract infection, site not specified: Secondary | ICD-10-CM | POA: Diagnosis present

## 2018-06-22 DIAGNOSIS — Z801 Family history of malignant neoplasm of trachea, bronchus and lung: Secondary | ICD-10-CM

## 2018-06-22 DIAGNOSIS — Z8614 Personal history of Methicillin resistant Staphylococcus aureus infection: Secondary | ICD-10-CM

## 2018-06-22 DIAGNOSIS — Z20828 Contact with and (suspected) exposure to other viral communicable diseases: Secondary | ICD-10-CM | POA: Diagnosis present

## 2018-06-22 DIAGNOSIS — G8921 Chronic pain due to trauma: Secondary | ICD-10-CM | POA: Diagnosis present

## 2018-06-22 DIAGNOSIS — Z794 Long term (current) use of insulin: Secondary | ICD-10-CM

## 2018-06-22 DIAGNOSIS — Z981 Arthrodesis status: Secondary | ICD-10-CM

## 2018-06-22 DIAGNOSIS — Z978 Presence of other specified devices: Secondary | ICD-10-CM

## 2018-06-22 DIAGNOSIS — Z9981 Dependence on supplemental oxygen: Secondary | ICD-10-CM

## 2018-06-22 DIAGNOSIS — R4702 Dysphasia: Secondary | ICD-10-CM | POA: Diagnosis present

## 2018-06-22 LAB — GLUCOSE, CAPILLARY
Glucose-Capillary: 359 mg/dL — ABNORMAL HIGH (ref 70–99)
Glucose-Capillary: 315 mg/dL — ABNORMAL HIGH (ref 70–99)
Glucose-Capillary: 222 mg/dL — ABNORMAL HIGH (ref 70–99)

## 2018-06-22 LAB — COMPREHENSIVE METABOLIC PANEL
ALT: 40 U/L (ref 0–44)
AST: 26 U/L (ref 15–41)
Albumin: 3.6 g/dL (ref 3.5–5.0)
Alkaline Phosphatase: 265 U/L — ABNORMAL HIGH (ref 38–126)
Anion gap: 18 — ABNORMAL HIGH (ref 5–15)
BUN: 37 mg/dL — ABNORMAL HIGH (ref 6–20)
CO2: 28 mmol/L (ref 22–32)
Calcium: 10.7 mg/dL — ABNORMAL HIGH (ref 8.9–10.3)
Chloride: 90 mmol/L — ABNORMAL LOW (ref 98–111)
Creatinine, Ser: 0.73 mg/dL (ref 0.44–1.00)
GFR calc Af Amer: >60 mL/min (ref >60)
GFR calc non Af Amer: >60 mL/min (ref >60)
Glucose, Bld: 398 mg/dL — ABNORMAL HIGH (ref 70–99)
Potassium: 3.7 mmol/L (ref 3.5–5.1)
Sodium: 136 mmol/L (ref 135–145)
Total Bilirubin: 0.5 mg/dL (ref 0.3–1.2)
Total Protein: 9 g/dL — ABNORMAL HIGH (ref 6.5–8.1)

## 2018-06-22 LAB — URINALYSIS, ROUTINE W REFLEX MICROSCOPIC
Bilirubin Urine: NEGATIVE
Glucose, UA: >=500 mg/dL — AB
Hgb urine dipstick: NEGATIVE
Ketones, ur: NEGATIVE mg/dL
Nitrite: NEGATIVE
Protein, ur: 100 mg/dL — AB
RBC / HPF: >50 RBC/hpf — ABNORMAL HIGH (ref 0–5)
Specific Gravity, Urine: 1.022 (ref 1.005–1.030)
WBC, UA: >50 WBC/hpf — ABNORMAL HIGH (ref 0–5)
pH: 7 (ref 5.0–8.0)

## 2018-06-22 LAB — CBC WITH DIFFERENTIAL/PLATELET
Abs Immature Granulocytes: 0.07 10*3/uL (ref 0.00–0.07)
Basophils Absolute: 0.1 10*3/uL (ref 0.0–0.1)
Basophils Relative: 1 %
Eosinophils Absolute: 0.1 10*3/uL (ref 0.0–0.5)
Eosinophils Relative: 1 %
HCT: 32.1 % — ABNORMAL LOW (ref 36.0–46.0)
Hemoglobin: 10.2 g/dL — ABNORMAL LOW (ref 12.0–15.0)
Immature Granulocytes: 0 %
Lymphocytes Relative: 12 %
Lymphs Abs: 2 10*3/uL (ref 0.7–4.0)
MCH: 29.1 pg (ref 26.0–34.0)
MCHC: 31.8 g/dL (ref 30.0–36.0)
MCV: 91.7 fL (ref 80.0–100.0)
Monocytes Absolute: 0.9 10*3/uL (ref 0.1–1.0)
Monocytes Relative: 5 %
Neutro Abs: 13.9 10*3/uL — ABNORMAL HIGH (ref 1.7–7.7)
Neutrophils Relative %: 81 %
Platelets: 510 10*3/uL — ABNORMAL HIGH (ref 150–400)
RBC: 3.5 MIL/uL — ABNORMAL LOW (ref 3.87–5.11)
RDW: 16.3 % — ABNORMAL HIGH (ref 11.5–15.5)
WBC: 17.1 10*3/uL — ABNORMAL HIGH (ref 4.0–10.5)
nRBC: 0 % (ref 0.0–0.2)

## 2018-06-22 LAB — EXPECTORATED SPUTUM ASSESSMENT W GRAM STAIN, RFLX TO RESP C

## 2018-06-22 LAB — SARS CORONAVIRUS 2 BY RT PCR (HOSPITAL ORDER, PERFORMED IN ~~LOC~~ HOSPITAL LAB): SARS Coronavirus 2: NEGATIVE

## 2018-06-22 LAB — CBG MONITORING, ED
Glucose-Capillary: 368 mg/dL — ABNORMAL HIGH (ref 70–99)
Glucose-Capillary: 328 mg/dL — ABNORMAL HIGH (ref 70–99)
Glucose-Capillary: 367 mg/dL — ABNORMAL HIGH (ref 70–99)
Glucose-Capillary: 337 mg/dL — ABNORMAL HIGH (ref 70–99)
Glucose-Capillary: 331 mg/dL — ABNORMAL HIGH (ref 70–99)

## 2018-06-22 LAB — I-STAT BETA HCG BLOOD, ED (MC, WL, AP ONLY): I-stat hCG, quantitative: 5.9 m[IU]/mL — ABNORMAL HIGH (ref <5)

## 2018-06-22 LAB — LACTIC ACID, PLASMA
Lactic Acid, Venous: 2.1 mmol/L (ref 0.5–1.9)
Lactic Acid, Venous: 1.3 mmol/L (ref 0.5–1.9)
Lactic Acid, Venous: 1.7 mmol/L (ref 0.5–1.9)

## 2018-06-22 LAB — HCG, QUANTITATIVE, PREGNANCY: hCG, Beta Chain, Quant, S: 7 m[IU]/mL — ABNORMAL HIGH (ref <5)

## 2018-06-22 LAB — PROTIME-INR
INR: 1 (ref 0.8–1.2)
Prothrombin Time: 13 seconds (ref 11.4–15.2)

## 2018-06-22 LAB — PROCALCITONIN: Procalcitonin: 0.3 ng/mL

## 2018-06-22 LAB — MRSA PCR SCREENING: MRSA by PCR: POSITIVE — AB

## 2018-06-22 MED ORDER — MIDODRINE HCL 5 MG PO TABS
10.0000 mg | ORAL_TABLET | Freq: Three times a day (TID) | ORAL | Status: DC
  Administered 2018-06-22 – 2018-06-27 (×15): 10 mg via ORAL
  Filled 2018-06-22 (×18): qty 2

## 2018-06-22 MED ORDER — INSULIN ASPART 100 UNIT/ML ~~LOC~~ SOLN
0.0000 [IU] | Freq: Four times a day (QID) | SUBCUTANEOUS | Status: DC
  Administered 2018-06-22: 6 [IU] via SUBCUTANEOUS

## 2018-06-22 MED ORDER — ACETAMINOPHEN 650 MG RE SUPP: 650.0000 mg | Freq: Four times a day (QID) | RECTAL | Status: DC | PRN

## 2018-06-22 MED ORDER — ACETAMINOPHEN 325 MG PO TABS
650.0000 mg | ORAL_TABLET | Freq: Four times a day (QID) | ORAL | Status: DC | PRN
  Administered 2018-06-22 – 2018-06-26 (×2): 650 mg via ORAL
  Filled 2018-06-22 (×3): qty 2

## 2018-06-22 MED ORDER — VANCOMYCIN HCL IN DEXTROSE 1-5 GM/200ML-% IV SOLN
1000.0000 mg | INTRAVENOUS | Status: DC
  Administered 2018-06-24 – 2018-06-26 (×3): 1000 mg via INTRAVENOUS
  Filled 2018-06-22 (×5): qty 200

## 2018-06-22 MED ORDER — PRO-STAT SUGAR FREE PO LIQD
30.0000 mL | Freq: Three times a day (TID) | ORAL | Status: DC
  Administered 2018-06-22 – 2018-06-27 (×14): 30 mL via ORAL
  Filled 2018-06-22 (×17): qty 30

## 2018-06-22 MED ORDER — TIOTROPIUM BROMIDE MONOHYDRATE 18 MCG IN CAPS: 18.0000 ug | ORAL_CAPSULE | Freq: Every day | RESPIRATORY_TRACT | Status: DC

## 2018-06-22 MED ORDER — SODIUM CHLORIDE 0.9 % IV SOLN
1000.0000 mL | INTRAVENOUS | Status: DC
  Administered 2018-06-22: 1000 mL via INTRAVENOUS

## 2018-06-22 MED ORDER — ONDANSETRON HCL 4 MG PO TABS
4.0000 mg | ORAL_TABLET | Freq: Four times a day (QID) | ORAL | Status: DC | PRN
  Administered 2018-06-24: 4 mg via ORAL
  Filled 2018-06-22: qty 1

## 2018-06-22 MED ORDER — ONDANSETRON HCL 4 MG/2ML IJ SOLN
4.0000 mg | Freq: Four times a day (QID) | INTRAMUSCULAR | Status: DC | PRN
  Administered 2018-06-22: 4 mg via INTRAVENOUS
  Filled 2018-06-22: qty 2

## 2018-06-22 MED ORDER — FERROUS SULFATE 300 (60 FE) MG/5ML PO SYRP
220.0000 mg | ORAL_SOLUTION | Freq: Every day | ORAL | Status: DC
  Administered 2018-06-23 – 2018-06-27 (×5): 220 mg
  Filled 2018-06-22 (×6): qty 5

## 2018-06-22 MED ORDER — SODIUM CHLORIDE 0.9% FLUSH
3.0000 mL | Freq: Once | INTRAVENOUS | Status: AC
  Administered 2018-06-26: 3 mL via INTRAVENOUS

## 2018-06-22 MED ORDER — VANCOMYCIN HCL IN DEXTROSE 1-5 GM/200ML-% IV SOLN
1000.0000 mg | Freq: Once | INTRAVENOUS | Status: AC
  Administered 2018-06-22: 06:00:00 1000 mg via INTRAVENOUS
  Filled 2018-06-22: qty 200

## 2018-06-22 MED ORDER — ALBUTEROL SULFATE (2.5 MG/3ML) 0.083% IN NEBU: 2.5000 mg | INHALATION_SOLUTION | RESPIRATORY_TRACT | Status: DC | PRN

## 2018-06-22 MED ORDER — BUPROPION HCL ER (SR) 150 MG PO TB12
150.0000 mg | ORAL_TABLET | Freq: Every day | ORAL | Status: DC
  Administered 2018-06-22 – 2018-06-27 (×6): 150 mg via ORAL
  Filled 2018-06-22 (×7): qty 1

## 2018-06-22 MED ORDER — INSULIN GLARGINE 100 UNIT/ML ~~LOC~~ SOLN
5.0000 [IU] | Freq: Every day | SUBCUTANEOUS | Status: DC
  Administered 2018-06-22: 5 [IU] via SUBCUTANEOUS
  Filled 2018-06-22 (×3): qty 0.05

## 2018-06-22 MED ORDER — LORAZEPAM 0.5 MG PO TABS
0.5000 mg | ORAL_TABLET | Freq: Every day | ORAL | Status: DC
  Administered 2018-06-22 – 2018-06-26 (×5): 0.5 mg via ORAL
  Filled 2018-06-22 (×5): qty 1

## 2018-06-22 MED ORDER — SODIUM CHLORIDE 0.9 % IV SOLN
1000.0000 mL | INTRAVENOUS | Status: DC
  Administered 2018-06-22 – 2018-06-26 (×2): 1000 mL via INTRAVENOUS

## 2018-06-22 MED ORDER — SODIUM CHLORIDE 0.9 % IV BOLUS
1000.0000 mL | Freq: Once | INTRAVENOUS | Status: AC
  Administered 2018-06-22: 1000 mL via INTRAVENOUS

## 2018-06-22 MED ORDER — MAGNESIUM SULFATE 2 GM/50ML IV SOLN
2.0000 g | Freq: Once | INTRAVENOUS | Status: AC
  Administered 2018-06-23: 2 g via INTRAVENOUS
  Filled 2018-06-22: qty 50

## 2018-06-22 MED ORDER — DULOXETINE HCL 30 MG PO CPEP
30.0000 mg | ORAL_CAPSULE | Freq: Two times a day (BID) | ORAL | Status: DC
  Administered 2018-06-22 – 2018-06-27 (×11): 30 mg via ORAL
  Filled 2018-06-22 (×12): qty 1

## 2018-06-22 MED ORDER — INSULIN ASPART 100 UNIT/ML ~~LOC~~ SOLN
5.0000 [IU] | Freq: Once | SUBCUTANEOUS | Status: AC
  Administered 2018-06-22: 5 [IU] via INTRAVENOUS

## 2018-06-22 MED ORDER — METRONIDAZOLE IN NACL 5-0.79 MG/ML-% IV SOLN
500.0000 mg | Freq: Once | INTRAVENOUS | Status: AC
  Administered 2018-06-22: 05:00:00 500 mg via INTRAVENOUS
  Filled 2018-06-22: qty 100

## 2018-06-22 MED ORDER — GLUCERNA 1.2 CAL PO LIQD
1000.0000 mL | ORAL | Status: DC
  Administered 2018-06-22 – 2018-06-26 (×5): 1000 mL
  Filled 2018-06-22 (×6): qty 1000

## 2018-06-22 MED ORDER — SODIUM CHLORIDE 0.9% FLUSH
3.0000 mL | Freq: Two times a day (BID) | INTRAVENOUS | Status: DC
  Administered 2018-06-22: 3 mL via INTRAVENOUS
  Administered 2018-06-23: 10 mL via INTRAVENOUS
  Administered 2018-06-24 – 2018-06-27 (×6): 3 mL via INTRAVENOUS

## 2018-06-22 MED ORDER — SODIUM CHLORIDE 0.9 % IV SOLN: 2.0000 g | Freq: Three times a day (TID) | INTRAVENOUS | Status: DC

## 2018-06-22 MED ORDER — GABAPENTIN 300 MG PO CAPS
300.0000 mg | ORAL_CAPSULE | Freq: Three times a day (TID) | ORAL | Status: DC
  Administered 2018-06-22 – 2018-06-27 (×15): 300 mg via ORAL
  Filled 2018-06-22 (×15): qty 1

## 2018-06-22 MED ORDER — INSULIN ASPART 100 UNIT/ML ~~LOC~~ SOLN
0.0000 [IU] | SUBCUTANEOUS | Status: DC
  Administered 2018-06-22: 3 [IU] via SUBCUTANEOUS
  Administered 2018-06-22: 7 [IU] via SUBCUTANEOUS
  Administered 2018-06-23: 5 [IU] via SUBCUTANEOUS

## 2018-06-22 MED ORDER — FAMOTIDINE 40 MG/5ML PO SUSR
20.0000 mg | Freq: Every day | ORAL | Status: DC
  Administered 2018-06-22 – 2018-06-26 (×6): 20 mg
  Filled 2018-06-22 (×6): qty 2.5

## 2018-06-22 MED ORDER — ENOXAPARIN SODIUM 40 MG/0.4ML ~~LOC~~ SOLN
40.0000 mg | Freq: Every day | SUBCUTANEOUS | Status: DC
  Administered 2018-06-22 – 2018-06-26 (×6): 40 mg via SUBCUTANEOUS
  Filled 2018-06-22 (×6): qty 0.4

## 2018-06-22 MED ORDER — UMECLIDINIUM BROMIDE 62.5 MCG/INH IN AEPB
1.0000 | INHALATION_SPRAY | Freq: Every day | RESPIRATORY_TRACT | Status: DC
  Administered 2018-06-23 – 2018-06-27 (×5): 1 via RESPIRATORY_TRACT
  Filled 2018-06-22: qty 7

## 2018-06-22 MED ORDER — ORAL CARE MOUTH RINSE
15.0000 mL | Freq: Two times a day (BID) | OROMUCOSAL | Status: DC
  Administered 2018-06-22 – 2018-06-26 (×9): 15 mL via OROMUCOSAL

## 2018-06-22 MED ORDER — METOCLOPRAMIDE HCL 5 MG PO TABS
5.0000 mg | ORAL_TABLET | Freq: Four times a day (QID) | ORAL | Status: DC
  Administered 2018-06-22 – 2018-06-26 (×17): 5 mg via ORAL
  Filled 2018-06-22 (×18): qty 1

## 2018-06-22 MED ORDER — ZOLPIDEM TARTRATE 5 MG PO TABS
2.5000 mg | ORAL_TABLET | Freq: Every day | ORAL | Status: DC
  Administered 2018-06-22 – 2018-06-23 (×2): 2.5 mg via ORAL
  Filled 2018-06-22 (×2): qty 1

## 2018-06-22 MED ORDER — SODIUM CHLORIDE 0.9 % IV SOLN
2.0000 g | Freq: Once | INTRAVENOUS | Status: AC
  Administered 2018-06-22: 2 g via INTRAVENOUS
  Filled 2018-06-22: qty 2

## 2018-06-22 MED ORDER — SODIUM CHLORIDE 0.9 % IV SOLN
1.0000 g | Freq: Three times a day (TID) | INTRAVENOUS | Status: DC
  Administered 2018-06-22 – 2018-06-27 (×15): 1 g via INTRAVENOUS
  Filled 2018-06-22 (×21): qty 1

## 2018-06-22 MED ORDER — POLYETHYLENE GLYCOL 3350 17 G PO PACK
17.0000 g | PACK | Freq: Every day | ORAL | Status: DC
  Administered 2018-06-22 – 2018-06-25 (×2): 17 g via ORAL
  Filled 2018-06-22 (×5): qty 1

## 2018-06-22 MED ORDER — CHLORHEXIDINE GLUCONATE 0.12 % MT SOLN
15.0000 mL | Freq: Two times a day (BID) | OROMUCOSAL | Status: DC
  Administered 2018-06-23 – 2018-06-27 (×9): 15 mL via OROMUCOSAL
  Filled 2018-06-22 (×9): qty 15

## 2018-06-22 NOTE — ED Notes (Signed)
Phlebotomy at bedside.

## 2018-06-22 NOTE — Consult Note (Addendum)
[facility] Nurse wound consult note Reason for Consult:Consult requested for several chronic pressure injuries.  Pt is familiar to [facility] team from previous admissions, most recent was 3/16.   Wound type: chronic stage 4 pressure injuries  Lower sacrum stage 4 pressure injury; 6X7X.3cm, red and moist, bone palpable, mod amt yellow drainage, no odor Upper sacrum stage 4 pressure injury; 2X1X.1cm, red and moist, bone palpable, mod amt yellow drainage, no odor Left ischium stage 4 pressure injury; 3X2X.2cm, red and moist, bone palpable, mod amt yellow drainage, no odor Right ischium stage 4 pressure injury; 4X3X.2cm, red and moist, bone palpable, mod amt yellow drainage, no odor Pressure Injury POA: Yes Dressing procedure/placement/frequency: Air mattress ordered to reduce pressure.  Prevalon boots to float heels.  Aquacel to absorb drainage and provide antimicrobial benefits and promote healing.  Please re-consult if further assistance is needed.  Thank-you,  Julien Girt MSN, [place], [place], [place], [place]

## 2018-06-22 NOTE — ED Notes (Signed)
Updated patient's mom, Angela Page. Phone number [phone]

## 2018-06-22 NOTE — ED Provider Notes (Signed)
[facility] EMERGENCY DEPARTMENT Provider Note  CSN: [id number] Arrival date & time: 06/22/18 0158  Chief Complaint(s) Aphasia  HPI Angela Page is a 48 y.o. female with extensive past medical history listed below who presents from a skilled nursing facility for change in her verbal response.  I spoke with the on-call nurse practitioner who believes the patient might have a urinary tract infection.  She reports that she was treated in April for pneumonia and a urinary tract infection with Vanco/Zosyn.  She does not have urine cultures available to her.  On record review I did see the patient had a urinary tract infection in March that grew out Proteus sensitive to cephalosporins.   Remainder of history, ROS, and physical exam limited due to patient's condition (AMS). Additional information was obtained from sNF.   Level V Caveat.    HPI  Past Medical History Past Medical History:  Diagnosis Date   Acute on chronic respiratory failure with hypoxia ([place])    Anasarca 06/10/2016   Asthma    At high risk for severe sepsis    Cocaine use 10/08/2013   Decubitus ulcer of buttock, unstageable (HCC)    Depression    Diabetes mellitus without complication (HCC)    GERD (gastroesophageal reflux disease)    HCAP (healthcare-associated pneumonia) 06/09/2016   Hepatitis    hx of hepatits frm mono    Kidney stone    Lobar pneumonia (HCC)    Overdose of opiate or related narcotic ([place]) 09/02/2014   Pacemaker    Pleurisy    Polysubstance dependence including opioid type drug, episodic abuse ([place]) 10/27/2013   Protein calorie malnutrition ([place]) 10/31/2015   Quadriparesis ([place])    Quadriplegia and quadriparesis ([place])    Quadriplegia, C5-C7 incomplete ([place]) 10/08/2013   Stage IV pressure ulcer of sacral region ([place]) 10/31/2015   Tracheostomy status ([place])    Patient Active Problem List   Diagnosis Date Noted   Chronic respiratory failure with  hypoxia ([place])    Encephalopathy acute 02/14/2018   Sepsis ([place]) 01/31/2018   Acute on chronic respiratory failure with hypoxia (HCC)    Quadriplegia and quadriparesis (HCC)    Lobar pneumonia (HCC)    At high risk for severe sepsis    Tracheostomy status ([place])    Decubitus ulcer of buttock, unstageable ([place])    Hyperglycemic hyperosmolar nonketotic coma ([place]) 12/16/2017   Lactic acid acidosis 12/16/2017   AKI (acute kidney injury) ([place]) 12/16/2017   Suprapubic catheter ([place]) 12/16/2017   HHNC (hyperglycemic hyperosmolar nonketotic coma) ([place]) 12/16/2017   Elevated lactic acid level    Pressure injury of skin 07/17/2017   Kidney stone 07/16/2017   Right ureteral stone 33/82/5053   Complicated UTI (urinary tract infection) 03/09/2017   Ineffective airway clearance    Sepsis secondary to UTI ([place]) 06/30/2016   Autonomic neuropathy 06/10/2016   Presence of permanent cardiac pacemaker 06/10/2016   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)    Esophageal dysphagia    Malnutrition of moderate degree 11/01/2015   Tobacco abuse 10/31/2015   Asthma 10/31/2015  History of pneumonia 10/08/2015   Pressure ulcer of contiguous region involving buttock and hip, stage 4 (HCC) 02/26/2015   Hypotension 35/32/9924   Acute metabolic encephalopathy 26/83/4196   Leukocytosis 10/23/2014   Hypokalemia 10/23/2014   Decubitus ulcer of sacral region, stage 4 ([place]) 10/23/2014   Anemia, iron deficiency  10/23/2014   Quadriplegia, C5-C7 incomplete ([place]) 10/22/2014   Depression    Protein-calorie malnutrition, severe ([place]) 09/05/2014   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   Recurrent UTI 03/31/2014   Pressure ulcer of coccygeal region, stage 4 ([place]) 01/06/2014   Stage 4 skin ulcer of sacral region ([place]) 01/06/2014   Neuropathic pain 11/30/2013   Paraplegia following spinal cord injury ([address]) 11/30/2013   Spinal cord injury, cervical region ([place]) 11/30/2013   Chronic pain due to injury 11/30/2013   S/P cervical spinal fusion 10/27/2013   Tracheostomy care ([place]) 10/27/2013   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   SCI (spinal cord injury) 10/11/2013   Home Medication(s) Prior to Admission medications   Medication Sig Start Date End Date Taking? Authorizing Provider  buPROPion (WELLBUTRIN SR) 150 MG 12 hr tablet Take 150 mg by mouth daily.   Yes [provider]  Dulaglutide (TRULICITY) 1.5 QI/2.9NL SOPN Inject 1.5 mg into the skin every Friday.   Yes [provider]  DULoxetine (CYMBALTA) 30 MG capsule Take 1 capsule (30 mg total) by mouth 2 (two) times daily. 02/17/18  Yes Danford, Suann Larry, MD  famotidine (PEPCID) 40 MG/5ML suspension Place 2.5 mLs (20 mg total) into feeding tube at bedtime. 12/31/17  Yes Arrien, Jimmy Picket, MD  ferrous sulfate 300 (60 Fe) MG/5ML syrup Place 3.7 mLs (220 mg total) into feeding tube daily with breakfast. 04/11/18  Yes Hall, Carole N, DO  furosemide (LASIX) 40 MG tablet Place 1 tablet (40 mg total) into feeding tube daily. Patient taking differently: Take 40 mg by mouth daily.  01/01/18 06/22/27 Yes Arrien, Jimmy Picket, MD  gabapentin (NEURONTIN) 300 MG capsule Take 300 mg by mouth 3 (three) times daily.   Yes [provider]  heparin 5000 UNIT/ML injection Inject 5,000 Units into the skin every 8 (eight) hours.   Yes [provider]  insulin aspart (NOVOLOG) 100 UNIT/ML injection  Inject 10 Units into the skin every 4 (four) hours. For glucose 150 to 200 use 1 units, for glucose 201-250 use 2 units, for 251 to 300 use 4 units, for 301 to 350 use 6 units, for 351 to 400 use 8 units, for 401 or greater use 10 units. Patient taking differently: Inject 0-10 Units into the skin See admin instructions. Check FSBS every 6 hours. Give insulin according to sliding scale: If BS is 0-150 = 0u, 151-200 = 2u, 201-250 = 4u, 251-300 = 6u, 301-350 = 8u, 351-400 = 10u. >400 call MD 12/31/17  Yes Arrien, Jimmy Picket, MD  Insulin Glargine (BASAGLAR KWIKPEN) 100 UNIT/ML SOPN Inject 0.05 mLs (5 Units total) into the skin at bedtime. 04/11/18  Yes Hall, Carole N, DO  LORazepam (ATIVAN) 0.5 MG tablet Take 1 tablet (0.5 mg total) by mouth every 8 (eight) hours as needed for anxiety. Patient taking differently: Take 0.5 mg by mouth daily at 2 PM.  02/18/18  Yes Danford, Suann Larry, MD  metoCLOPramide (REGLAN) 5 MG tablet Take 5 mg by mouth 4 (four) times daily.    Yes [provider]  midodrine (PROAMATINE) 10 MG tablet Take 10 mg by mouth 3 (three) times daily.   Yes [provider]  Nutritional Supplements (FEEDING SUPPLEMENT, GLUCERNA 1.2 CAL,) LIQD Place 1,000 mLs into feeding tube daily. Patient taking differently: Place 1,000 mLs into feeding tube See admin instructions. On at 1400 and off at 1000 02/18/18  Yes Danford, Suann Larry, MD  Nutritional Supplements (PROMOD) LIQD Take 30 mLs by mouth 3 (three) times daily.   Yes [provider]  polyethylene glycol (MIRALAX / GLYCOLAX) packet Take 17 g by mouth daily. Hold if diarrhea. 01/01/18  Yes Arrien, Jimmy Picket, MD  tiotropium (SPIRIVA) 18 MCG inhalation capsule Place 18 mcg into inhaler and inhale daily.   Yes [provider]  zolpidem (AMBIEN) 5 MG tablet Take 2.5 mg by mouth at bedtime.   Yes [provider]  Amino Acids-Protein Hydrolys (FEEDING SUPPLEMENT, PRO-STAT SUGAR FREE 64,) LIQD  Place 30 mLs into feeding tube 2 (two) times daily. Patient not taking: Reported on 06/22/2018 02/17/18   Edwin Dada, MD  baclofen (LIORESAL) 20 MG tablet Take 1 tablet (20 mg total) by mouth every 8 (eight) hours as needed for muscle spasms. Patient not taking: Reported on 06/22/2018 02/17/18   Edwin Dada, MD  gabapentin (NEURONTIN) 100 MG capsule Take 1 capsule (100 mg total) by mouth 3 (three) times daily. Patient not taking: Reported on 06/22/2018 02/17/18   Edwin Dada, MD  oxyCODONE (OXY IR/ROXICODONE) 5 MG immediate release tablet Place 1 tablet (5 mg total) into feeding tube every 4 (four) hours as needed (breakthrough pain). Patient not taking: Reported on 06/22/2018 02/18/18   Edwin Dada, MD  OXYGEN Inhale 3 L into the lungs continuous.    [provider]  temazepam (RESTORIL) 7.5 MG capsule Take 1 capsule (7.5 mg total) by mouth at bedtime as needed for sleep. Patient not taking: Reported on 06/22/2018 02/18/18   Edwin Dada, MD                                                                                                                                    Past Surgical History Past Surgical History:  Procedure Laterality Date   APPENDECTOMY     BACK SURGERY  10/08/2013   fusion of spine, cervical region   CARDIAC SURGERY     CYSTOSCOPY W/ URETERAL STENT PLACEMENT Right 03/09/2017   Procedure: CYSTOSCOPY [name] STENT PLACEMENT;  Surgeon: Irine Seal, MD;  Location: [facility];  Service: Urology;  Laterality: Right;   CYSTOSCOPY W/ URETERAL STENT REMOVAL Right 07/16/2017   Procedure: CYSTOSCOPY WITH STENT REMOVAL;  Surgeon: Franchot Gallo, MD;  Location: [facility];  Service: Urology;  Laterality: Right;   CYSTOSCOPY/URETEROSCOPY/HOLMIUM LASER/STENT PLACEMENT Right 07/16/2017   Procedure: CYSTOSCOPY/URETEROSCOPY/HOLMIUM LASER/STENT PLACEMENT/ALSO STONE EXTRACTION;  Surgeon: Franchot Gallo, MD;  Location: [facility];  Service:  Urology;  Laterality: Right;   GASTROSTOMY  11/23/2015   I&D EXTREMITY  10/28/2011   Procedure: IRRIGATION AND DEBRIDEMENT EXTREMITY;  Surgeon: Tennis Must, MD;  Location: [facility];  Service: Orthopedics;  Laterality: Right;  Irrigation and debridement right middle finger   INSERTION OF SUPRAPUBIC CATHETER N/A 07/28/2016   Procedure: INSERTION OF SUPRAPUBIC CATHETER;  Surgeon: Franchot Gallo, MD;  Location: [facility];  Service: Urology;  Laterality: N/A;   IR GENERIC HISTORICAL  11/23/2015   IR GASTROSTOMY TUBE MOD SED 11/23/2015 [facility]   IR PATIENT EVAL TECH 0-60 MINS  12/18/2017   IR REPLACE G-TUBE SIMPLE WO FLUORO  12/19/2017   LITHOTRIPSY  08/2017   TRACHEOSTOMY     Family History Family History  Problem Relation Age of Onset   Hypertension Maternal Uncle    Kidney failure Maternal Uncle    Colon cancer Mother    Lung cancer Father    Hypertension Brother    Kidney failure Maternal Aunt    Kidney failure Maternal Uncle    Colon cancer Maternal Grandfather    Esophageal cancer Neg Hx     Social History Social History   Tobacco Use   Smoking status: Current Every Day Smoker    Packs/day: 1.00    Years: 25.00    Pack years: 25.00    Types: Cigarettes   Smokeless tobacco: Never Used  Substance Use Topics   Alcohol use: Never    Frequency: Never   Drug use: No   Allergies Penicillins; Erythromycin; Nitrofurantoin monohyd macro; and Oxybutynin  Review of Systems Review of Systems  Physical Exam Vital Signs  I have reviewed the triage vital signs BP (!) 160/98    Pulse 88    Temp (!) 97.1 F (36.2 C) (Rectal)    Resp (!) 22    Ht 5\' 2"  (1.575 m)    Wt 46 kg    LMP 05/27/2016 (Within Days) Comment: Irregular periods since October 2015.   SpO2 94%    BMI 18.55 kg/m   Physical Exam Vitals signs reviewed.  Constitutional:      General: She is not in acute distress.    Appearance: She is well-developed. She is not  diaphoretic.  HENT:     Head: Normocephalic and atraumatic.      Nose: Nose normal.  Eyes:     General: No scleral icterus.       Right eye: No discharge.        Left eye: No discharge.     Conjunctiva/sclera: Conjunctivae normal.     Pupils: Pupils are equal, round, and reactive to light.  Neck:     Musculoskeletal: Normal range of motion and neck supple.  Cardiovascular:     Rate and Rhythm: Normal rate and regular rhythm.     Heart sounds: No murmur. No friction rub. No gallop.   Pulmonary:     Effort: Pulmonary effort is normal. No respiratory distress.     Breath sounds: Normal breath sounds. No stridor. No rales.  Abdominal:     General: There is no distension.     Palpations: Abdomen is soft.     Tenderness: There is no abdominal tenderness.    Musculoskeletal:        General: No tenderness.       Back:  Skin:    General: Skin is warm and dry.     Findings: No erythema or rash.  Neurological:     Mental Status: She is alert.     Comments: Upper and lower extremity contractures.     ED Results and Treatments Labs (all labs ordered are listed, but only abnormal results are displayed) Labs  Reviewed  COMPREHENSIVE METABOLIC PANEL - Abnormal; Notable for the following components:      Result Value   Chloride 90 (*)    Glucose, Bld 398 (*)    BUN 37 (*)    Calcium 10.7 (*)    Total Protein 9.0 (*)    Alkaline Phosphatase 265 (*)    Anion gap 18 (*)    All other components within normal limits  LACTIC ACID, PLASMA - Abnormal; Notable for the following components:   Lactic Acid, Venous 2.1 (*)    All other components within normal limits  CBC WITH DIFFERENTIAL/PLATELET - Abnormal; Notable for the following components:   WBC 17.1 (*)    RBC 3.50 (*)    Hemoglobin 10.2 (*)    HCT 32.1 (*)    RDW 16.3 (*)    Platelets 510 (*)    Neutro Abs 13.9 (*)    All other components within normal limits  URINALYSIS, ROUTINE W REFLEX MICROSCOPIC - Abnormal; Notable for  the following components:   APPearance CLOUDY (*)    Glucose, UA >=500 (*)    Protein, ur 100 (*)    Leukocytes,Ua LARGE (*)    RBC / HPF >50 (*)    WBC, UA >50 (*)    Bacteria, UA RARE (*)    All other components within normal limits  CBG MONITORING, ED - Abnormal; Notable for the following components:   Glucose-Capillary 368 (*)    All other components within normal limits  I-STAT BETA HCG BLOOD, ED ([facility], [facility], [facility] ONLY) - Abnormal; Notable for the following components:   I-stat hCG, quantitative 5.9 (*)    All other components within normal limits  CULTURE, BLOOD (ROUTINE X 2)  SARS CORONAVIRUS 2 (HOSPITAL ORDER, [facility])  CULTURE, BLOOD (ROUTINE X 2)  URINE CULTURE  PROTIME-INR  LACTIC ACID, PLASMA  CBG MONITORING, ED  CBG MONITORING, ED  CBG MONITORING, ED                                                                                                                         EKG  EKG Interpretation  Date/Time:  Tuesday June 22 2018 02:15:46 EDT Ventricular Rate:  80 PR Interval:    QRS Duration: 95 QT Interval:  436 QTC Calculation: 503 R Axis:   43 Text Interpretation:  Sinus rhythm Probable left ventricular hypertrophy Minimal ST elevation, inferior leads Borderline prolonged QT interval No significant change since last tracing Confirmed by Addison Lank [phone]) on 06/22/2018 2:43:12 AM      Radiology Ct Head Wo Contrast  Result Date: 06/22/2018 CLINICAL DATA:  Altered level of consciousness EXAM: CT HEAD WITHOUT CONTRAST TECHNIQUE: Contiguous axial images were obtained from the base of the skull through the vertex without intravenous contrast. COMPARISON:  02/14/2018 FINDINGS: Brain: No evidence of acute infarction, hemorrhage, hydrocephalus, extra-axial collection or mass lesion/mass effect. Mild for age cerebral volume loss Vascular: No hyperdense vessel or unexpected calcification.  Skull: Normal. Negative for fracture or focal lesion.  Sinuses/Orbits: Negative Other: Motion degraded IMPRESSION: Stable, negative head CT. Electronically Signed   By: Monte Fantasia M.D.   On: 06/22/2018 04:17   Dg Chest Port 1 View  Result Date: 06/22/2018 CLINICAL DATA:  Sepsis EXAM: PORTABLE CHEST 1 VIEW COMPARISON:  04/06/2018 FINDINGS: The tracheostomy tube terminates above the carina. The dual chamber left-sided pacemaker is stable in positioning. The heart size is stable. The lungs are clear. There is blunting of the right costophrenic angle which has improved from prior study. There is no pneumothorax. IMPRESSION: No active disease. Electronically Signed   By: Constance Holster M.D.   On: 06/22/2018 03:19   Pertinent labs & imaging results that were available during my care of the patient were reviewed by me and considered in my medical decision making (see chart for details).  Medications Ordered in ED Medications  sodium chloride flush (NS) 0.9 % injection 3 mL (3 mLs Intravenous Not Given 06/22/18 0241)  0.9 %  sodium chloride infusion (1,000 mLs Intravenous New Bag/Given 06/22/18 0434)  vancomycin (VANCOCIN) IVPB 1000 mg/200 mL premix (1,000 mg Intravenous New Bag/Given 06/22/18 0622)  insulin aspart (novoLOG) injection 5 Units (has no administration in time range)  ceFEPIme (MAXIPIME) 2 g in sodium chloride 0.9 % 100 mL IVPB (has no administration in time range)  ceFEPIme (MAXIPIME) 2 g in sodium chloride 0.9 % 100 mL IVPB (0 g Intravenous Stopped 06/22/18 0511)  metroNIDAZOLE (FLAGYL) IVPB 500 mg (0 mg Intravenous Stopped 06/22/18 0621)  sodium chloride 0.9 % bolus 1,000 mL (1,000 mLs Intravenous New Bag/Given 06/22/18 0621)                                                                                                                                    Procedures .Critical Care Performed by: Fatima Blank, MD Authorized by: Fatima Blank, MD     CRITICAL CARE Performed by: Grayce Sessions Addilynn Mowrer Total critical care  time: 30 minutes Critical care time was exclusive of separately billable procedures and treating other patients. Critical care was necessary to treat or prevent imminent or life-threatening deterioration. Critical care was time spent personally by me on the following activities: development of treatment plan with patient and/or surrogate as well as nursing, discussions with consultants, evaluation of patient's response to treatment, examination of patient, obtaining history from patient or surrogate, ordering and performing treatments and interventions, ordering and review of laboratory studies, ordering and review of radiographic studies, pulse oximetry and re-evaluation of patient's condition.  (including critical care time)  Medical Decision Making / ED Course I have reviewed the nursing notes for this encounter and the patient's prior records (if available in EHR or on provided paperwork).    Work-up suspicious for sepsis.  Likely urinary source.  Chest x-ray without evidence of pneumonia.  Code sepsis was initiated and patient was started on empiric antibiotics.  Patient also hyperglycemic without evidence of DKA.  Given IV fluids and dose of insulin.  Case discussed with medicine for admission and continued management.  Final Clinical Impression(s) / ED Diagnoses Final diagnoses:  Sepsis ([place])      This chart was dictated using voice recognition software.  Despite best efforts to proofread,  errors can occur which can change the documentation meaning.   Fatima Blank, MD 06/22/18 [phone]

## 2018-06-22 NOTE — ED Notes (Signed)
Spoke with patient's mother and provided update.

## 2018-06-22 NOTE — Progress Notes (Signed)
Daily Nursing Note  Received report from ER nurse. Patient brought to floos, noted to have had a bowel movement. (+) four decubitus ulcers, for which wound care was consulted. Initiated on TF at 48ml/hr also has NS running at 144ml/hr, asked team if this should be continued. Chlohexadine bath given and MRSA swab sent which was (+).  All patient needs me throughout shift.

## 2018-06-22 NOTE — ED Triage Notes (Signed)
Came in via ems; from [place]. Reported pt is usually verbal and has had changes in verbal response. Stated symptoms 0700 06/21/2018

## 2018-06-22 NOTE — Progress Notes (Signed)
Pharmacy Antibiotic Note  Angela Page is a 48 y.o. female admitted on 06/22/2018 with aphasia.  Pharmacy has been consulted for vancomycin dosing for sepsis.  Patient already received first doses of antibiotics.  Noted he is quadriplegic with chronic trach and PEG.  SCr 0.73, CrCL 63 ml/min (likely an overestimation), afebrile, WBC 17.1, LA 2.1.   Plan: Vanc 1g IV Q24H for AUC 532 using SCr 0.8 Reduce cefepime to 1gm IV Q8H given low body weight Monitor renal fxn, clinical progress, vanc AUC at Css given quadriplegia   Height: 5\' 2"  (157.5 cm) Weight: 101 lb 6.6 oz (46 kg) IBW/kg (Calculated) : 50.1  Temp (24hrs), Avg:97.1 F (36.2 C), Min:97.1 F (36.2 C), Max:97.1 F (36.2 C)  Recent Labs  Lab 06/22/18 0219  WBC 17.1*  CREATININE 0.73  LATICACIDVEN 2.1*    Estimated Creatinine Clearance: 63.1 mL/min (by C-G formula based on SCr of 0.73 mg/dL).    Allergies  Allergen Reactions  . Penicillins Hives    Has patient had a PCN reaction causing immediate rash, facial/tongue/throat swelling, SOB or lightheadedness with hypotension: Yes Has patient had a PCN reaction causing severe rash involving mucus membranes or skin necrosis: Yes Has patient had a PCN reaction that required hospitalization No Has patient had a PCN reaction occurring within the last 10 years: No-childhood allergy If all of the above answers are "NO", then may proceed with Cephalosporin  Tolerated cephalosporins in past  . Erythromycin Nausea And Vomiting  . Nitrofurantoin Monohyd Macro Nausea And Vomiting  . Oxybutynin     Dries mouth out     Vanc 6/2 >> Cefepime 6/2 >>  6/2 covid - negative 6/2 UCx - 6/2 BCx -   [name] D. Mina Marble, PharmD, BCPS, [place] 06/22/2018, 8:37 AM

## 2018-06-22 NOTE — ED Notes (Signed)
NP Arbie Cookey with [facility] can be reached @ [phone]; Please call.

## 2018-06-22 NOTE — H&P (Signed)
History and Physical    Prairie Stenberg TKW:[id number] DOB: 05-Aug-1970 DOA: 06/22/2018  Referring MD/NP/PA: Mitzi Hansen, MD PCP: Default, Provider, MD  Patient coming from: [place] skilled nursing facility via EMS  Chief Complaint: Altered  I have personally briefly reviewed patient's old medical records in [place]   HPI: Angela Page is a 48 y.o. 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  She presented from skilled nursing facility for change in mentation.  History is limited due to current condition.  Records show that patient was last hospitalized from 3/16-3/22, with MRSA pneumonia and Proteus mirabilis urinary tract infection.  Proteus was noted to be sensitive to cephalosporins and was treated with 5 days of IV vancomycin and cefepime.  Prior to that hospitalization patient was admitted in January for altered mental status found to have Pseudomonas urinary tract infection.  ED Course: Upon admission into the emergency department patient was seen to be afebrile, pulse 82-111, respirations 16-30, blood pressure 117/95-165/108, and O2 saturations maintained on 10 L with FiO2 98%.  Labs revealed WBC 17.7, hemoglobin 10.2, platelets 510, glucose 398, BUN 37, creatinine 0.73, calcium 10.7, anion gap 18, lactic acid 2.1 and alkaline phosphatase 265.  COVID 19 screening negative.  Chest x-ray negative for any acute abnormalities.  Urinalysis was positive for large leukocytes, rare bacteria, RBCs, and many WBCs. Patient had initially been given 1 L normal saline IV fluids and 5 units of NovoLog insulin.  She was also started on empiric antibiotics cefepime, metronidazole,  and vancomycin.  Review of Systems  Unable to perform ROS: Mental status change    Past Medical History:  Diagnosis Date   Acute on chronic respiratory failure with hypoxia ([place])    Anasarca 06/10/2016   Asthma    At high risk for severe sepsis    Cocaine use 10/08/2013   Decubitus ulcer of buttock, unstageable (HCC)    Depression    Diabetes mellitus without complication (HCC)    GERD (gastroesophageal reflux disease)    HCAP (healthcare-associated pneumonia) 06/09/2016   Hepatitis    hx of hepatits frm mono    Kidney stone    Lobar pneumonia (HCC)    Overdose of opiate or related narcotic ([place]) 09/02/2014   Pacemaker    Pleurisy    Polysubstance dependence including opioid type drug, episodic abuse ([place]) 10/27/2013   Protein calorie malnutrition ([place]) 10/31/2015   Quadriparesis ([place])    Quadriplegia and quadriparesis ([place])    Quadriplegia, C5-C7 incomplete ([place]) 10/08/2013   Stage IV pressure ulcer of sacral region ([place]) 10/31/2015   Tracheostomy status ([place])     Past Surgical History:  Procedure Laterality Date   APPENDECTOMY     BACK SURGERY  10/08/2013   fusion of spine, cervical region   [place] Right 03/09/2017   Procedure: CYSTOSCOPY [name] STENT PLACEMENT;  Surgeon: Irine Seal, MD;  Location: [facility];  Service: Urology;  Laterality: Right;   CYSTOSCOPY W/ URETERAL STENT REMOVAL Right 07/16/2017   Procedure: CYSTOSCOPY WITH STENT REMOVAL;  Surgeon: Franchot Gallo, MD;  Location: [facility];  Service: Urology;  Laterality: Right;   CYSTOSCOPY/URETEROSCOPY/HOLMIUM LASER/STENT PLACEMENT Right 07/16/2017   Procedure: CYSTOSCOPY/URETEROSCOPY/HOLMIUM LASER/STENT PLACEMENT/ALSO STONE EXTRACTION;  Surgeon: Franchot Gallo, MD;  Location: [facility];  Service: Urology;  Laterality: Right;   GASTROSTOMY  11/23/2015   I&D EXTREMITY  10/28/2011   Procedure: IRRIGATION AND DEBRIDEMENT EXTREMITY;   Surgeon: Tennis Must, MD;  Location: [facility];  Service: Orthopedics;  Laterality: Right;  Irrigation and debridement right middle finger   INSERTION OF SUPRAPUBIC CATHETER N/A 07/28/2016   Procedure: INSERTION OF SUPRAPUBIC CATHETER;  Surgeon: Franchot Gallo, MD;  Location: [facility];  Service: Urology;  Laterality: N/A;   IR GENERIC HISTORICAL  11/23/2015   IR GASTROSTOMY TUBE MOD SED 11/23/2015 [facility]   IR PATIENT EVAL TECH 0-60 MINS  12/18/2017   IR REPLACE G-TUBE SIMPLE WO FLUORO  12/19/2017   LITHOTRIPSY  08/2017   TRACHEOSTOMY       reports that she has been smoking cigarettes. She has a 25.00 pack-year smoking history. She has never used smokeless tobacco. She reports that she does not drink alcohol or use drugs.  Allergies  Allergen Reactions   Penicillins Hives    Has patient had a PCN reaction causing immediate rash, facial/tongue/throat swelling, SOB or lightheadedness with hypotension: Yes Has patient had a PCN reaction causing severe rash involving mucus membranes or skin necrosis: Yes Has patient had a PCN reaction that required hospitalization No Has patient had a PCN reaction occurring within the last 10 years: No-childhood allergy If all of the above answers are "NO", then may proceed with Cephalosporin  Tolerated cephalosporins in past   Erythromycin Nausea And Vomiting   Nitrofurantoin Monohyd Macro Nausea And Vomiting   Oxybutynin     Dries mouth out     Family History  Problem Relation Age of Onset   Hypertension Maternal Uncle    Kidney failure Maternal Uncle    Colon cancer Mother    Lung cancer Father    Hypertension Brother    Kidney failure Maternal Aunt    Kidney failure Maternal Uncle    Colon cancer Maternal Grandfather    Esophageal cancer Neg Hx     Prior to Admission medications   Medication Sig Start Date End Date Taking? Authorizing Provider  buPROPion (WELLBUTRIN SR) 150 MG 12 hr tablet Take 150  mg by mouth daily.   Yes [provider]  Dulaglutide (TRULICITY) 1.5 XF/8.1WE SOPN Inject 1.5 mg into the skin every Friday.   Yes [provider]  DULoxetine (CYMBALTA) 30 MG capsule Take 1 capsule (30 mg total) by mouth 2 (two) times daily. 02/17/18  Yes Danford, Suann Larry, MD  famotidine (PEPCID) 40 MG/5ML suspension Place 2.5 mLs (20 mg total) into feeding tube at bedtime. 12/31/17  Yes Arrien, Jimmy Picket, MD  ferrous sulfate 300 (60 Fe) MG/5ML syrup Place 3.7 mLs (220 mg total) into feeding tube daily with breakfast. 04/11/18  Yes Hall, Carole N, DO  furosemide (LASIX) 40 MG tablet Place 1 tablet (40 mg total) into feeding tube daily. Patient taking differently: Take 40 mg by mouth daily.  01/01/18 06/22/27 Yes Arrien, Jimmy Picket, MD  gabapentin (NEURONTIN) 300 MG capsule Take 300 mg by mouth 3 (three) times daily.   Yes [provider]  heparin 5000 UNIT/ML injection Inject 5,000 Units into the skin every 8 (eight) hours.   Yes [provider]  insulin aspart (NOVOLOG) 100 UNIT/ML injection Inject 10 Units into the skin every 4 (four) hours. For glucose 150 to 200 use 1 units, for glucose 201-250 use 2 units, for 251 to 300 use 4 units, for 301 to 350 use  6 units, for 351 to 400 use 8 units, for 401 or greater use 10 units. Patient taking differently: Inject 0-10 Units into the skin See admin instructions. Check FSBS every 6 hours. Give insulin according to sliding scale: If BS is 0-150 = 0u, 151-200 = 2u, 201-250 = 4u, 251-300 = 6u, 301-350 = 8u, 351-400 = 10u. >400 call MD 12/31/17  Yes Arrien, Jimmy Picket, MD  Insulin Glargine (BASAGLAR KWIKPEN) 100 UNIT/ML SOPN Inject 0.05 mLs (5 Units total) into the skin at bedtime. 04/11/18  Yes Hall, Carole N, DO  LORazepam (ATIVAN) 0.5 MG tablet Take 1 tablet (0.5 mg total) by mouth every 8 (eight) hours as needed for anxiety. Patient taking differently: Take 0.5 mg by mouth daily at 2 PM.  02/18/18  Yes  Danford, Suann Larry, MD  metoCLOPramide (REGLAN) 5 MG tablet Take 5 mg by mouth 4 (four) times daily.    Yes [provider]  midodrine (PROAMATINE) 10 MG tablet Take 10 mg by mouth 3 (three) times daily.   Yes [provider]  Nutritional Supplements (FEEDING SUPPLEMENT, GLUCERNA 1.2 CAL,) LIQD Place 1,000 mLs into feeding tube daily. Patient taking differently: Place 1,000 mLs into feeding tube See admin instructions. On at 1400 and off at 1000 02/18/18  Yes Danford, Suann Larry, MD  Nutritional Supplements (PROMOD) LIQD Take 30 mLs by mouth 3 (three) times daily.   Yes [provider]  polyethylene glycol (MIRALAX / GLYCOLAX) packet Take 17 g by mouth daily. Hold if diarrhea. 01/01/18  Yes Arrien, Jimmy Picket, MD  tiotropium (SPIRIVA) 18 MCG inhalation capsule Place 18 mcg into inhaler and inhale daily.   Yes [provider]  zolpidem (AMBIEN) 5 MG tablet Take 2.5 mg by mouth at bedtime.   Yes [provider]  Amino Acids-Protein Hydrolys (FEEDING SUPPLEMENT, PRO-STAT SUGAR FREE 64,) LIQD Place 30 mLs into feeding tube 2 (two) times daily. Patient not taking: Reported on 06/22/2018 02/17/18   Edwin Dada, MD  baclofen (LIORESAL) 20 MG tablet Take 1 tablet (20 mg total) by mouth every 8 (eight) hours as needed for muscle spasms. Patient not taking: Reported on 06/22/2018 02/17/18   Edwin Dada, MD  gabapentin (NEURONTIN) 100 MG capsule Take 1 capsule (100 mg total) by mouth 3 (three) times daily. Patient not taking: Reported on 06/22/2018 02/17/18   Edwin Dada, MD  oxyCODONE (OXY IR/ROXICODONE) 5 MG immediate release tablet Place 1 tablet (5 mg total) into feeding tube every 4 (four) hours as needed (breakthrough pain). Patient not taking: Reported on 06/22/2018 02/18/18   Edwin Dada, MD  OXYGEN Inhale 3 L into the lungs continuous.    [provider]  temazepam (RESTORIL) 7.5 MG capsule Take 1 capsule  (7.5 mg total) by mouth at bedtime as needed for sleep. Patient not taking: Reported on 06/22/2018 02/18/18   Edwin Dada, MD    Physical Exam:  Constitutional: Middle-aged female who appears to be in some mild distress Vitals:   06/22/18 0500 06/22/18 0515 06/22/18 0620 06/22/18 0652  BP: (!) 143/93 140/82 (!) 160/98   Pulse: 85 90 (!) 111 88  Resp: 19 (!) 25 (!) 23 (!) 22  Temp:      TempSrc:      SpO2: 92% 92% 97% 94%  Weight:      Height:       Eyes: PERRL, lids and conjunctivae normal ENMT: Mucous membranes are dry.  Posterior pharynx clear of any exudate or lesions.  Neck: Tracheostomy present with thick purulent appearing secretions. Respiratory: Tachypneic with expiratory wheezes present.  No Cardiovascular: Regular rate and rhythm, no murmurs / rubs / gallops. No extremity edema. 2+ pedal pulses. No carotid bruits.  Abdomen: Protuberant abdomen with PEG tube in place.  No tenderness appreciated.  Bowel sounds present.  Urine is cloudy with sediment present. Musculoskeletal: no clubbing / cyanosis.  Muscle wasting with contractures noted of the upper and lower extremities. Skin: Pressure ulcers of the gluteal fold.  No erythema around PEG tube. Neurologic: Quadriplegic.  Otherwise CN 2-12 grossly intact.  Psychiatric: Alert, but unable to assess for orientation because patient only moaning when asked questions.    Labs on Admission: I have personally reviewed following labs and imaging studies  CBC: Recent Labs  Lab 06/22/18 0219  WBC 17.1*  NEUTROABS 13.9*  HGB 10.2*  HCT 32.1*  MCV 91.7  PLT 893*   Basic Metabolic Panel: Recent Labs  Lab 06/22/18 0219  NA 136  K 3.7  CL 90*  CO2 28  GLUCOSE 398*  BUN 37*  CREATININE 0.73  CALCIUM 10.7*   GFR: Estimated Creatinine Clearance: 63.1 mL/min (by C-G formula based on SCr of 0.73 mg/dL). Liver Function Tests: Recent Labs  Lab 06/22/18 0219  AST 26  ALT 40  ALKPHOS 265*  BILITOT 0.5  PROT  9.0*  ALBUMIN 3.6   No results for input(s): LIPASE, AMYLASE in the last 168 hours. No results for input(s): AMMONIA in the last 168 hours. Coagulation Profile: Recent Labs  Lab 06/22/18 0219  INR 1.0   Cardiac Enzymes: No results for input(s): CKTOTAL, CKMB, CKMBINDEX, TROPONINI in the last 168 hours. BNP (last 3 results) No results for input(s): PROBNP in the last 8760 hours. HbA1C: No results for input(s): HGBA1C in the last 72 hours. CBG: Recent Labs  Lab 06/22/18 0206  GLUCAP 368*   Lipid Profile: No results for input(s): CHOL, HDL, LDLCALC, TRIG, CHOLHDL, LDLDIRECT in the last 72 hours. Thyroid Function Tests: No results for input(s): TSH, T4TOTAL, FREET4, T3FREE, THYROIDAB in the last 72 hours. Anemia Panel: No results for input(s): VITAMINB12, FOLATE, FERRITIN, TIBC, IRON, RETICCTPCT in the last 72 hours. Urine analysis:    Component Value Date/Time   COLORURINE YELLOW 06/22/2018 0454   APPEARANCEUR CLOUDY (A) 06/22/2018 0454   LABSPEC 1.022 06/22/2018 0454   PHURINE 7.0 06/22/2018 0454   GLUCOSEU >=500 (A) 06/22/2018 0454   GLUCOSEU NEGATIVE 04/28/2017 1121   HGBUR NEGATIVE 06/22/2018 0454   BILIRUBINUR NEGATIVE 06/22/2018 0454   KETONESUR NEGATIVE 06/22/2018 0454   PROTEINUR 100 (A) 06/22/2018 0454   UROBILINOGEN 0.2 04/28/2017 1121   NITRITE NEGATIVE 06/22/2018 0454   LEUKOCYTESUR LARGE (A) 06/22/2018 0454   Sepsis Labs: Recent Results (from the past 240 hour(s))  Culture, blood (Routine x 2)     Status: None (Preliminary result)   Collection Time: 06/22/18  2:20 AM  Result Value Ref Range Status   Specimen Description BLOOD LEFT HAND  Final   Special Requests   Final    BOTTLES DRAWN AEROBIC AND ANAEROBIC Blood Culture results may not be optimal due to an inadequate volume of blood received in culture bottles Performed at [facility], [place] [address], [place], [place] [address]    Culture PENDING  Incomplete   Report Status PENDING   Incomplete  SARS Coronavirus 2 (CEPHEID - Performed in [facility] hospital lab), Hosp Order     Status: None   Collection Time: 06/22/18  4:35  AM  Result Value Ref Range Status   SARS Coronavirus 2 NEGATIVE NEGATIVE Final    Comment: (NOTE) If result is NEGATIVE SARS-CoV-2 target nucleic acids are NOT DETECTED. The SARS-CoV-2 RNA is generally detectable in upper and lower  respiratory specimens during the acute phase of infection. The lowest  concentration of SARS-CoV-2 viral copies this assay can detect is 250  copies / mL. A negative result does not preclude SARS-CoV-2 infection  and should not be used as the sole basis for treatment or other  patient management decisions.  A negative result may occur with  improper specimen collection / handling, submission of specimen other  than nasopharyngeal swab, presence of viral mutation(s) within the  areas targeted by this assay, and inadequate number of viral copies  (<250 copies / mL). A negative result must be combined with clinical  observations, patient history, and epidemiological information. If result is POSITIVE SARS-CoV-2 target nucleic acids are DETECTED. The SARS-CoV-2 RNA is generally detectable in upper and lower  respiratory specimens dur ing the acute phase of infection.  Positive  results are indicative of active infection with SARS-CoV-2.  Clinical  correlation with patient history and other diagnostic information is  necessary to determine patient infection status.  Positive results do  not rule out bacterial infection or co-infection with other viruses. If result is PRESUMPTIVE POSTIVE SARS-CoV-2 nucleic acids MAY BE PRESENT.   A presumptive positive result was obtained on the submitted specimen  and confirmed on repeat testing.  While 2019 novel coronavirus  (SARS-CoV-2) nucleic acids may be present in the submitted sample  additional confirmatory testing may be necessary for epidemiological  and / or clinical  management purposes  to differentiate between  SARS-CoV-2 and other Sarbecovirus currently known to infect humans.  If clinically indicated additional testing with an alternate test  methodology [phone]) is advised. The SARS-CoV-2 RNA is generally  detectable in upper and lower respiratory sp ecimens during the acute  phase of infection. The expected result is Negative. Fact Sheet for Patients:  StrictlyIdeas.no Fact Sheet for Healthcare Providers: BankingDealers.co.za This test is not yet approved or cleared by the [place] FDA and has been authorized for detection and/or diagnosis of SARS-CoV-2 by FDA under an Emergency Use Authorization (EUA).  This EUA will remain in effect (meaning this test can be used) for the duration of the COVID-19 declaration under Section 564(b)(1) of the Act, 21 U.S.C. section 360bbb-3(b)(1), unless the authorization is terminated or revoked sooner. Performed at [facility], [place] [address], [place], [place] [address]      Radiological Exams on Admission: Ct Head Wo Contrast  Result Date: 06/22/2018 CLINICAL DATA:  Altered level of consciousness EXAM: CT HEAD WITHOUT CONTRAST TECHNIQUE: Contiguous axial images were obtained from the base of the skull through the vertex without intravenous contrast. COMPARISON:  02/14/2018 FINDINGS: Brain: No evidence of acute infarction, hemorrhage, hydrocephalus, extra-axial collection or mass lesion/mass effect. Mild for age cerebral volume loss Vascular: No hyperdense vessel or unexpected calcification. Skull: Normal. Negative for fracture or focal lesion. Sinuses/Orbits: Negative Other: Motion degraded IMPRESSION: Stable, negative head CT. Electronically Signed   By: Monte Fantasia M.D.   On: 06/22/2018 04:17   Dg Chest Port 1 View  Result Date: 06/22/2018 CLINICAL DATA:  Sepsis EXAM: PORTABLE CHEST 1 VIEW COMPARISON:  04/06/2018 FINDINGS: The tracheostomy tube  terminates above the carina. The dual chamber left-sided pacemaker is stable in positioning. The heart size is stable. The lungs are clear. There  is blunting of the right costophrenic angle which has improved from prior study. There is no pneumothorax. IMPRESSION: No active disease. Electronically Signed   By: Constance Holster M.D.   On: 06/22/2018 03:19    EKG: Independently reviewed.  Sinus rhythm at 80 bpm QTc 503  Assessment/Plan  Sepsis secondary to UTI: Acute on chronic.  Patient presents with tachycardia, tachypnea, leukocytosis 17.1, and lactic acid 2.1.  Chest x-ray otherwise noted to be clear, but urinalysis suggestive of infection.  Review of records shows that during last admission in March patient had MRSA in sputum and Proteus in the urine with some resistances noted. Urine cultures positive for Pseudomonas in January of this year.  Sepsis protocol had been initiated with patient being given 1 L normal saline IV fluids, cefepime, metronidazole, and vancomycin.  Suspecting sepsis secondary to pneumonia, but given previous history could also underlying respiratory infection as well. -Admit to a progressive bed -Follow-up urine, blood, and sputum culture -Add-on pro calcitonin -Continue vancomycin and cefepime.  -Trend lactic acid levels -Continue IV fluids 125 mL/h  Acute encephalopathy: Suspect secondary to infection -Neurochecks  COPD, acute on chronic respiratory failure, s/p tracheostomy: Patient normally on 3 L of supplemental oxygen at baseline.  However, requiring 10 L with FiO2 98% to maintain O2 saturations with wheezing noted -Continuous pulse oximetry with oxygen as needed -Respiratory for trach management -Albuterol nebs as needed for shortness of breath/wheezing -Continue Spiriva  Hypercalcemia: Acute.  On admission calcium elevated to 10.7.   -IV fluids as seen above -Recheck calcium levels in a.m.  Insulin dependent diabetes mellitus: Blood sugar elevated at  398 on admission.  Given 5 units of NovoLog in the ED.  Hemoglobin A1c reportedly 9.4 in January.   -Hypoglycemic protocol -Continue glargine regimen of 5 units nightly -Continue home sliding scale CBGs every 4 hours -Adjust regimen as needed  Elevated anion gap: Acute.  On admission anion gap 18 with glucose elevated at 398, but CO2 28.  Lactic acid also noted to be elevated at 2.1.  Unclear the exact cause of anion gap. -Continue follow-up repeat labs  Normocytic normochromic anemia: Hemoglobin 10.2 g/dL with elevated RDW.  Baseline appears to be around 8 to 9 g/dL.  Elevated RDW could signify iron deficiency, but patient  on iron supplementation.  Likely follow-up in outpatient setting or recheck levels. -Continue ferrous sulfate -Recheck CBC in a.m.  Prolonged QT interval: Acute.  Seen on initial EKG of 503. -Correct electrolyte abnormalities -Recheck EKG -Give 2 g of magnesium sulfate  Thrombocytosis: Acute.  Platelet count 510 on admission.  Suspect this could be reactive in nature due to presenting symptoms. -Check CBC in a.m.  Spastic quadriplegia, chronic pain   -Continue gabapentin and Cymbalta  Anxiety -Continue Wellbutrin and Ativan  Pressure ulcers  -Lower ulcer measures  -Wound care consult  Chronic hypotension: Blood pressures currently maintained -Continue midodrine  Elevated bed hCG: Acute.  As seen on on i-STAT 5.9. -Check quantitative serum beta-hCG  DVT prophylaxis: Heparin Code Status: Full Family Communication: No family present at bedside Disposition Plan: Likely discharge back to skilled nursing facility once medically ready Consults called: None Admission status: Inpatient  Norval Morton MD [facility] Pager [phone]   If 7PM-7AM, please contact night-coverage [email or web address] Password [facility]  06/22/2018, 7:41 AM

## 2018-06-23 DIAGNOSIS — E43 Unspecified severe protein-calorie malnutrition: Secondary | ICD-10-CM

## 2018-06-23 DIAGNOSIS — R1310 Dysphagia, unspecified: Secondary | ICD-10-CM

## 2018-06-23 DIAGNOSIS — G8220 Paraplegia, unspecified: Secondary | ICD-10-CM

## 2018-06-23 DIAGNOSIS — E876 Hypokalemia: Secondary | ICD-10-CM

## 2018-06-23 DIAGNOSIS — G909 Disorder of the autonomic nervous system, unspecified: Secondary | ICD-10-CM

## 2018-06-23 DIAGNOSIS — J42 Unspecified chronic bronchitis: Secondary | ICD-10-CM

## 2018-06-23 DIAGNOSIS — G9341 Metabolic encephalopathy: Secondary | ICD-10-CM

## 2018-06-23 LAB — GLUCOSE, CAPILLARY
Glucose-Capillary: 299 mg/dL — ABNORMAL HIGH (ref 70–99)
Glucose-Capillary: 207 mg/dL — ABNORMAL HIGH (ref 70–99)
Glucose-Capillary: 147 mg/dL — ABNORMAL HIGH (ref 70–99)
Glucose-Capillary: 154 mg/dL — ABNORMAL HIGH (ref 70–99)
Glucose-Capillary: 241 mg/dL — ABNORMAL HIGH (ref 70–99)
Glucose-Capillary: 177 mg/dL — ABNORMAL HIGH (ref 70–99)
Glucose-Capillary: 196 mg/dL — ABNORMAL HIGH (ref 70–99)

## 2018-06-23 LAB — COMPREHENSIVE METABOLIC PANEL
ALT: 23 U/L (ref 0–44)
AST: 16 U/L (ref 15–41)
Albumin: 3 g/dL — ABNORMAL LOW (ref 3.5–5.0)
Alkaline Phosphatase: 184 U/L — ABNORMAL HIGH (ref 38–126)
Anion gap: 14 (ref 5–15)
BUN: 35 mg/dL — ABNORMAL HIGH (ref 6–20)
CO2: 26 mmol/L (ref 22–32)
Calcium: 9.9 mg/dL (ref 8.9–10.3)
Chloride: 103 mmol/L (ref 98–111)
Creatinine, Ser: 0.61 mg/dL (ref 0.44–1.00)
GFR calc Af Amer: >60 mL/min (ref >60)
GFR calc non Af Amer: >60 mL/min (ref >60)
Glucose, Bld: 318 mg/dL — ABNORMAL HIGH (ref 70–99)
Potassium: 3.4 mmol/L — ABNORMAL LOW (ref 3.5–5.1)
Sodium: 143 mmol/L (ref 135–145)
Total Bilirubin: 0.5 mg/dL (ref 0.3–1.2)
Total Protein: 7.1 g/dL (ref 6.5–8.1)

## 2018-06-23 LAB — CBC
HCT: 27.8 % — ABNORMAL LOW (ref 36.0–46.0)
Hemoglobin: 8.5 g/dL — ABNORMAL LOW (ref 12.0–15.0)
MCH: 29 pg (ref 26.0–34.0)
MCHC: 30.6 g/dL (ref 30.0–36.0)
MCV: 94.9 fL (ref 80.0–100.0)
Platelets: 368 10*3/uL (ref 150–400)
RBC: 2.93 MIL/uL — ABNORMAL LOW (ref 3.87–5.11)
RDW: 16.9 % — ABNORMAL HIGH (ref 11.5–15.5)
WBC: 15.1 10*3/uL — ABNORMAL HIGH (ref 4.0–10.5)
nRBC: 0 % (ref 0.0–0.2)

## 2018-06-23 MED ORDER — OXYCODONE HCL 5 MG/5ML PO SOLN
5.0000 mg | Freq: Four times a day (QID) | ORAL | Status: DC | PRN
  Administered 2018-06-23 – 2018-06-24 (×4): 5 mg via ORAL
  Filled 2018-06-23 (×4): qty 5

## 2018-06-23 MED ORDER — BACLOFEN 5 MG HALF TABLET
20.0000 mg | ORAL_TABLET | Freq: Three times a day (TID) | ORAL | Status: DC | PRN
  Administered 2018-06-23 – 2018-06-27 (×6): 20 mg via ORAL
  Filled 2018-06-23 (×7): qty 4

## 2018-06-23 MED ORDER — OXYCODONE HCL 5 MG PO TABS
5.0000 mg | ORAL_TABLET | Freq: Once | ORAL | Status: AC
  Administered 2018-06-23: 5 mg via ORAL
  Filled 2018-06-23: qty 1

## 2018-06-23 MED ORDER — INSULIN GLARGINE 100 UNIT/ML ~~LOC~~ SOLN
10.0000 [IU] | Freq: Every day | SUBCUTANEOUS | Status: DC
  Administered 2018-06-23: 10 [IU] via SUBCUTANEOUS
  Filled 2018-06-23 (×2): qty 0.1

## 2018-06-23 MED ORDER — INSULIN ASPART 100 UNIT/ML ~~LOC~~ SOLN
0.0000 [IU] | SUBCUTANEOUS | Status: DC
  Administered 2018-06-23 (×2): 3 [IU] via SUBCUTANEOUS
  Administered 2018-06-23: 5 [IU] via SUBCUTANEOUS
  Administered 2018-06-23: 3 [IU] via SUBCUTANEOUS
  Administered 2018-06-23: 5 [IU] via SUBCUTANEOUS
  Administered 2018-06-24: 3 [IU] via SUBCUTANEOUS
  Administered 2018-06-24: 5 [IU] via SUBCUTANEOUS
  Administered 2018-06-24: 3 [IU] via SUBCUTANEOUS
  Administered 2018-06-24: 5 [IU] via SUBCUTANEOUS
  Administered 2018-06-24: 3 [IU] via SUBCUTANEOUS
  Administered 2018-06-24: 5 [IU] via SUBCUTANEOUS
  Administered 2018-06-25: 3 [IU] via SUBCUTANEOUS

## 2018-06-23 NOTE — Evaluation (Signed)
Clinical/Bedside Swallow Evaluation Patient Details  Name: Angela Page MRN: [id number] Date of Birth: 02/16/1970  Today's Date: 06/23/2018 Time: SLP Start Time (ACUTE ONLY): 1 SLP Stop Time (ACUTE ONLY): 0935 SLP Time Calculation (min) (ACUTE ONLY): 15 min  Past Medical History:  Past Medical History:  Diagnosis Date  . Acute on chronic respiratory failure with hypoxia ([place])   . Anasarca 06/10/2016  . Asthma   . At high risk for severe sepsis   . Cocaine use 10/08/2013  . Decubitus ulcer of buttock, unstageable ([place])   . Depression   . Diabetes mellitus without complication ([place])   . GERD (gastroesophageal reflux disease)   . HCAP (healthcare-associated pneumonia) 06/09/2016  . Hepatitis    hx of hepatits frm mono   . Kidney stone   . Lobar pneumonia ([place])   . Overdose of opiate or related narcotic ([place]) 09/02/2014  . Pacemaker   . Pleurisy   . Polysubstance dependence including opioid type drug, episodic abuse ([place]) 10/27/2013  . Protein calorie malnutrition ([place]) 10/31/2015  . Quadriparesis ([place])   . Quadriplegia and quadriparesis ([place])   . Quadriplegia, C5-C7 incomplete ([place]) 10/08/2013  . Stage IV pressure ulcer of sacral region ([place]) 10/31/2015  . Tracheostomy status [facility])    Past Surgical History:  Past Surgical History:  Procedure Laterality Date  . APPENDECTOMY    . BACK SURGERY  10/08/2013   fusion of spine, cervical region  . CARDIAC SURGERY    . CYSTOSCOPY W/ URETERAL STENT PLACEMENT Right 03/09/2017   Procedure: CYSTOSCOPY [name] STENT PLACEMENT;  Surgeon: Irine Seal, MD;  Location: [facility];  Service: Urology;  Laterality: Right;  . CYSTOSCOPY W/ URETERAL STENT REMOVAL Right 07/16/2017   Procedure: CYSTOSCOPY WITH STENT REMOVAL;  Surgeon: Franchot Gallo, MD;  Location: [facility];  Service: Urology;  Laterality: Right;  . CYSTOSCOPY/URETEROSCOPY/HOLMIUM LASER/STENT PLACEMENT Right 07/16/2017   Procedure: CYSTOSCOPY/URETEROSCOPY/HOLMIUM LASER/STENT  PLACEMENT/ALSO STONE EXTRACTION;  Surgeon: Franchot Gallo, MD;  Location: [facility];  Service: Urology;  Laterality: Right;  . GASTROSTOMY  11/23/2015  . I&D EXTREMITY  10/28/2011   Procedure: IRRIGATION AND DEBRIDEMENT EXTREMITY;  Surgeon: Tennis Must, MD;  Location: [facility];  Service: Orthopedics;  Laterality: Right;  Irrigation and debridement right middle finger  . INSERTION OF SUPRAPUBIC CATHETER N/A 07/28/2016   Procedure: INSERTION OF SUPRAPUBIC CATHETER;  Surgeon: Franchot Gallo, MD;  Location: [facility];  Service: Urology;  Laterality: N/A;  . IR GENERIC HISTORICAL  11/23/2015   IR GASTROSTOMY TUBE MOD SED 11/23/2015 [facility]  . IR PATIENT EVAL TECH 0-60 MINS  12/18/2017  . IR REPLACE G-TUBE SIMPLE WO FLUORO  12/19/2017  . LITHOTRIPSY  08/2017  . TRACHEOSTOMY     HPI:  Angela Page is a 48 y.o. 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  She presented from skilled nursing facility for change in mentation.  Suspected to be due to UTI. Pt has a hx of with chronic dysphagia, Most recent FEES in May 2018 recommended Dys 1 diet, thin liquids with use of chin tuck. PMH includes: chronic trach, quadriplegia, PNA, GERD, DM, substance abuse. In prior visits in 2020 she has been on a water protocol, and also on dys 2/thin after AMS from UTi improved. No focal CN deficits ahve been  noted but pt with extensive hardware in her neck and h/o poor laryngeal closure.   Assessment / Plan / Recommendation Clinical Impression  Pt presents with swallow function consistent with baseline per pt report and prior assessments. She is alert, cogntiion adequate, on ATC with PMSV, mild  intermittent cough prior to trials. She consumed sips of thin liquids without immediate signs of aspiration, two swallows noted with puree.   Pt typically consumes puree and thin liquids at SNF; she has dentures there, but no adhesive at her facility. She reports she has not been on upgraded solids due to "concern for aspiration pna." Aspiration pna is a risk for this pt due to quadriplegia and tracheostomy, PEG tube. Aspiration of thin liquids even possible at times. However, aspiration pna due to occult aspiration of solids is not logical, pt would certainly present with overt signs of difficulty - coughing, choking, if she could not manage solids. If solids are possible she should have access to them for QOL.   Will start a puree diet and thin liquids and provide her with adhesive to take back to SNF, and recommend SLP assessment there to place her dentures and attempt trials of upgraded solids. She has never had dentures present at this facility to appropriately assess her with solids. No further acute interventions needed.   SLP Visit Diagnosis: Dysphagia, oral phase (R13.11)    Aspiration Risk  Mild aspiration risk    Diet Recommendation Thin liquid;Dysphagia 1 (Puree)   Liquid Administration via: Cup;Straw Medication Administration: Whole meds with puree Supervision: Full supervision/cueing for compensatory strategies Compensations: Slow rate;Small sips/bites Postural Changes: Seated upright at 90 degrees    Other  Recommendations Oral Care Recommendations: Oral care BID Other Recommendations: Place PMSV during PO intake   Follow up Recommendations Skilled Nursing facility      Frequency and Duration            Prognosis        Swallow Study   General HPI: Angela Page is a 48 y.o. female with medical history significant of traumatic cervical spine fracture with resulting quadriplegia status post trach, PEG, suprapubic catheter, COPD with chronic hypoxic respiratory  failure, chronic hypotension, insulin-dependent diabetes mellitus, chronic pain, sick sinus syndrome with pacer, chronic normocytic anemia, and decubitus ulcers.  She presented from skilled nursing facility for change in mentation.  Suspected to be due to UTI. Pt has a hx of with chronic dysphagia, Most recent FEES in May 2018 recommended Dys 1 diet, thin liquids with use of chin tuck. PMH includes: chronic trach, quadriplegia, PNA, GERD, DM, substance abuse. In prior visits in 2020 she has been on a water protocol, and also on dys 2/thin after AMS from UTi improved. No focal CN deficits ahve been noted but pt with extensive hardware in her neck and h/o poor laryngeal closure. Type of Study: Bedside Swallow Evaluation Previous Swallow Assessment: see HPI Diet Prior to this Study: NPO Temperature Spikes Noted: No Respiratory Status: Trach Collar History of Recent Intubation: No Behavior/Cognition: Alert;Cooperative;Pleasant mood Oral Cavity Assessment: Within Functional Limits Oral Care Completed by SLP: No Oral Cavity - Dentition: Poor condition;Missing dentition Vision: Functional for self-feeding Self-Feeding Abilities: Total assist Patient Positioning: Upright in bed Baseline Vocal Quality: Normal Volitional Cough: Strong Volitional Swallow: Able to elicit    Oral/Motor/Sensory Function Overall Oral Motor/Sensory Function: Within functional limits   Ice Chips Ice chips: Not tested   Thin Liquid Thin Liquid: Within functional limits    Nectar Thick Nectar  Thick Liquid: Not tested   Honey Thick Honey Thick Liquid: Not tested   Puree Puree: Within functional limits   Solid     Solid: Not tested     [name], MA [place] Pager [phone] Office [phone]  Lynann Beaver 06/23/2018,9:43 AM

## 2018-06-23 NOTE — Progress Notes (Signed)
Initial Nutrition Assessment  INTERVENTION:   Continue Glucerna 1.2 @ 57 ml/hr x 20 hours via PEG. Continue 30 ml Prostat BID This regimen provides 1668 kcal, 113g protein and 917 ml H2O.   NUTRITION DIAGNOSIS:   Increased nutrient needs related to wound healing as evidenced by estimated needs.  GOAL:   Patient will meet greater than or equal to 90% of their needs  MONITOR:   PO intake, Labs, Weight trends, TF tolerance, I & O's, Skin  REASON FOR ASSESSMENT:   (Wounds)    ASSESSMENT:   48 y.o. 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 Admitted from SNF for AMS.  **RD working remotely**  Patient from SNF on chronic trach and PEG. Regimen at facility is Glucerna 1.2 @ 57 ml/hr x 20 hours (1400-1000) with Promod TID (this provides 1668 kcal, 98g protein). This regimen has already been ordered with Prostat in place of Promod.  SLP evaluated pt today, recommended dysphagia 1 diet with thin liquids as pt consumes a pureed diet at her SNF. Pt has no dentures so was unable to trial solids this admission.  Per weight records, pt's weight has decreased but unable to know exact amount as suspect pt was not weighed at admission (same exact wt recorded for the last 3 encounters).  Medications: Ferrous sulfate syrup daily, Reglan tablet QID  Labs reviewed: CBGs: 207-299 Low K   NUTRITION - FOCUSED PHYSICAL EXAM:  Unable to perform per department requirements to work remotely.  Diet Order:   Diet Order            DIET - DYS 1 Room service appropriate? Yes; Fluid consistency: Thin  Diet effective now              EDUCATION NEEDS:   No education needs have been identified at  this time  Skin:  Skin Assessment: Skin Integrity Issues: Skin Integrity Issues:: Stage IV Stage IV: upper and lower sacrum, left and right ischial tuberosity  Last BM:  6/3  Height:   Ht Readings from Last 1 Encounters:  06/22/18 5\' 2"  (1.575 m)    Weight:   Wt Readings from Last 1 Encounters:  06/22/18 46 kg    Ideal Body Weight:  42.9 kg(adjusted for quadriplegia)  BMI:  Body mass index is 18.55 kg/m.  Estimated Nutritional Needs:   Kcal:  1600-1800  Protein:  90-100g  Fluid:  1.8L/day  Clayton Bibles, MS, RD, LDN [place] Dietitian Pager: [phone] After Hours Pager: [phone]

## 2018-06-23 NOTE — Progress Notes (Signed)
Progress Note    Angela Page  MLY:[id number] DOB: 20-Sep-1970  DOA: 06/22/2018 PCP: Default, Provider, MD    Brief Narrative:   Chief complaint: F/U AMS  Medical records reviewed and are as summarized below:  Angela Page is an 48 y.o. female with a PMH of traumatic cervical spine fracture resulting in quadriplegia, status post trach and PEG, suprapubic catheter, COPD with chronic hypoxic respiratory failure, chronic hypotension, IDDM, chronic pain, sick sinus syndrome status post pacemaker, chronic normocytic anemia and chronic decubitus ulcers who was admitted from her skilled nursing facility on 06/22/2018 for evaluation of AMS.  She has a longstanding history of multiple hospitalizations for treatment of infections with healthcare associated organisms.  Upon initial evaluation in the ED, her chest x-ray was clear but urinalysis showed a large amount of leukocytes.  She was given a liter of fluids and started on empiric antibiotics with cefepime, metronidazole and vancomycin.  Assessment/Plan:   Principal Problems:   Sepsis secondary to complicated UTI/suprapubic catheter (Angela Page) with leukocytosis and lactic acidosis, present on admission Initially presented with tachycardia, tachypnea, leukocytosis and lactic acidosis concerning for sepsis from a urinary source.  Chest x-ray personally reviewed and is clear.  Given history of healthcare acquired organisms, she was broadly covered with cefepime, metronidazole and vancomycin after receiving fluid volume resuscitation.  Urine, blood and sputum cultures were sent.  Lactic acidosis resolved with IV fluid resuscitation.  COVID-19 testing was negative.  Procalcitonin elevated at 0.30, suggesting we should maintain IV antibiotics and given this patient's h/o healthcare acquired organisms, will need to await cultures before it is safe to narrow.  This morning, the patient remains afebrile with stable vital signs.    Acute metabolic  encephalopathy CT of the head personally reviewed and is unremarkable.  Acute metabolic encephalopathy likely secondary to sepsis/infection.  Treating as above.  Mental status appears to be improved this morning.  The patient is conversant.  Active Problems:   Asthma/COPD with acute on chronic respiratory failure status post tracheostomy Uses 3 L of supplemental oxygen at baseline.  On admission, she was requiring up to 10 L oxygen saturations currently stable.  Continue respiratory care for tracheostomy management and albuterol nebulizers as needed for shortness of breath/wheezing.  Continue Spiriva and Ellipta.  Reports thick secretions consistent with baseline.  Oxygen therapy continues, but is using 3 L at the moment.    Hypercalcemia  Calcium level was elevated to 10.7 on admission and she was hydrated vigorously.  Calcium 9.9 this morning.  When corrected for low albumin, calcium level still 10.7.  Continue IV fluids and monitor.  Check ionized calcium.    Type 2 diabetes, uncontrolled, with long-term insulin use Patient had marked elevation of blood glucoses on admission and most recent hemoglobin A1c was 9.4% indicating suboptimal control.  Currently being managed with Lantus 5 units every evening along with SSI (insulin sensitive scale) every 4 hours.  CBG range since admission: 207-359.  Will change Lantus to 10 units and SSI to moderate scale.    Metabolic acidosis with elevated anion gap Anion gap 18 on admission with a bicarb of 28.  Possibly from lactic acidosis though the patient did have marked elevation of blood glucoses and could have been an early DKA.  Anion gap improved to 14 this morning with IV fluid hydration and resolution of lactic acidosis.    Hypokalemia We will replete.    Depression/anxiety Continue Wellbutrin and Ativan.    Quadriplegia, C5-C7 incomplete (  HCC)/spastic quadriplegia with chronic pain/autonomic neuropathy Supportive care.  Continue gabapentin and  Cymbalta.  Blood pressure chronically low.  Continue midodrine.  Resume baclofen as patient is complaining of painful muscle spasms.  Reduce pressure with mattress overlay.    Decubitus ulcer of sacral region, stage 4 Angela Page) present on admission Wound care RN to evaluate and provide treatment recommendations.    Normocytic normochromic anemia Stable.  Continue ferrous sulfate.    Prolonged QT interval Given magnesium sulfate.  Monitor on telemetry.  Keep electrolytes normal.    Thrombocytosis ([place]) Likely an acute phase reactant to infection.  Platelet count 510 on admission.    Chronic dysphasia status post PEG Patient reports that she eats pured foods at baseline.  Evaluated by speech therapist and diet advanced to dysphagia 1.    MRSA carrier  On contact precautions.    Severe protein calorie malnutrition/hypoalbuminemia Body mass index is 18.55 kg/m.  Dietitian following.  Continue tube feeds and dysphagia 1 diet.   Family Communication/Anticipated D/C date and plan/Code Status   DVT prophylaxis: Lovenox ordered. Code Status: Full Code.  Family Communication: Mother updated by telephone 06/23/18. Disposition Plan: From Angela Page.   Medical Consultants:    None.   Anti-Infectives:    Vancomycin 06/22/2018--->  Flagyl 06/22/2018--->  Cefepime 06/22/2018--->  Subjective:   [name] reports that she is feeling better this morning.  She reports that the speech therapist has been by and has advanced her diet.  Has some chest congestion with thick secretions that need to be suctioned at times.  Intermittent issues with some shortness of breath.  No complaints of nausea or vomiting.  Has reported some muscle spasms and chronic hand pain that is unchanged from her normal.  Objective:    Vitals:   06/23/18 0045 06/23/18 0432 06/23/18 0505 06/23/18 0717  BP:   124/72 118/75  Pulse: 84 89  80  Resp: (!) 24 18 20 16   Temp:   98.4 F (36.9 C) 98.6 F (37 C)   TempSrc:   Oral Oral  SpO2: 96% 99% 96% 96%  Weight:      Height:        Intake/Output Summary (Last 24 hours) at 06/23/2018 0738 Last data filed at 06/23/2018 0500 Gross per 24 hour  Intake 756 ml  Output 1300 ml  Net -544 ml   Filed Weights   06/22/18 0330  Weight: 46 kg    Exam: General: No acute distress. Frail. Cardiovascular: Heart sounds show a regular rate, and rhythm. No gallops or rubs. No murmurs. No JVD. Lungs: Trach in place. Diminished breath sounds but relatively clear. Abdomen: Soft, nontender, nondistended with normal active bowel sounds. No masses. No hepatosplenomegaly. PEG present. Neurological: Alert and oriented 2. Paraplegic. Skin: Stage IV pressure wounds to sacrum, left ischium and right ischium. Extremities: No clubbing or cyanosis. No edema. Pedal pulses 2+. Bilateral foot drop. Psychiatric: Mood and affect are normal. Insight and judgment are fair.   Data Reviewed:   I have personally reviewed following labs and imaging studies:  Labs: Labs show the following:   Basic Metabolic Panel: Recent Labs  Lab 06/22/18 0219 06/23/18 0442  NA 136 143  K 3.7 3.4*  CL 90* 103  CO2 28 26  GLUCOSE 398* 318*  BUN 37* 35*  CREATININE 0.73 0.61  CALCIUM 10.7* 9.9   GFR Estimated Creatinine Clearance: 63.1 mL/min (by C-G formula based on SCr of 0.61 mg/dL). Liver Function Tests: Recent Labs  Lab 06/22/18  0219 06/23/18 0442  AST 26 16  ALT 40 23  ALKPHOS 265* 184*  BILITOT 0.5 0.5  PROT 9.0* 7.1  ALBUMIN 3.6 3.0*   Coagulation profile Recent Labs  Lab 06/22/18 0219  INR 1.0    CBC: Recent Labs  Lab 06/22/18 0219 06/23/18 0442  WBC 17.1* 15.1*  NEUTROABS 13.9*  --   HGB 10.2* 8.5*  HCT 32.1* 27.8*  MCV 91.7 94.9  PLT 510* 368   CBG: Recent Labs  Lab 06/22/18 1627 06/22/18 1925 06/22/18 2317 06/23/18 0504 06/23/18 0716  GLUCAP 359* 315* 222* 299* 207*   Sepsis Labs: Recent Labs  Lab 06/22/18 0219 06/22/18 0937  06/22/18 1416 06/23/18 0442  PROCALCITON 0.30  --   --   --   WBC 17.1*  --   --  15.1*  LATICACIDVEN 2.1* 1.3 1.7  --     Microbiology Recent Results (from the past 240 hour(s))  Culture, blood (Routine x 2)     Status: None (Preliminary result)   Collection Time: 06/22/18  2:20 AM  Result Value Ref Range Status   Specimen Description BLOOD LEFT HAND  Final   Special Requests   Final    BOTTLES DRAWN AEROBIC AND ANAEROBIC Blood Culture results may not be optimal due to an inadequate volume of blood received in culture bottles   Culture   Final    NO GROWTH 1 DAY Performed at [facility], [place] [address], [place], [place] [address]    Report Status PENDING  Incomplete  Culture, blood (Routine x 2)     Status: None (Preliminary result)   Collection Time: 06/22/18  2:50 AM  Result Value Ref Range Status   Specimen Description BLOOD RIGHT HAND  Final   Special Requests   Final    BOTTLES DRAWN AEROBIC AND ANAEROBIC Blood Culture adequate volume   Culture   Final    NO GROWTH 1 DAY Performed at [facility], [place] [address], [place], [place] [address]    Report Status PENDING  Incomplete  SARS Coronavirus 2 (CEPHEID - Performed in [place] hospital lab), Hosp Order     Status: None   Collection Time: 06/22/18  4:35 AM  Result Value Ref Range Status   SARS Coronavirus 2 NEGATIVE NEGATIVE Final    Comment: (NOTE) If result is NEGATIVE SARS-CoV-2 target nucleic acids are NOT DETECTED. The SARS-CoV-2 RNA is generally detectable in upper and lower  respiratory specimens during the acute phase of infection. The lowest  concentration of SARS-CoV-2 viral copies this assay can detect is 250  copies / mL. A negative result does not preclude SARS-CoV-2 infection  and should not be used as the sole basis for treatment or other  patient management decisions.  A negative result may occur with  improper specimen collection / handling, submission of specimen other  than  nasopharyngeal swab, presence of viral mutation(s) within the  areas targeted by this assay, and inadequate number of viral copies  (<250 copies / mL). A negative result must be combined with clinical  observations, patient history, and epidemiological information. If result is POSITIVE SARS-CoV-2 target nucleic acids are DETECTED. The SARS-CoV-2 RNA is generally detectable in upper and lower  respiratory specimens dur ing the acute phase of infection.  Positive  results are indicative of active infection with SARS-CoV-2.  Clinical  correlation with patient history and other diagnostic information is  necessary to determine patient infection status.  Positive results do  not rule out bacterial infection or co-infection with other viruses. If result is PRESUMPTIVE POSTIVE SARS-CoV-2 nucleic acids MAY BE PRESENT.   A presumptive positive result was obtained on the submitted specimen  and confirmed on repeat testing.  While 2019 novel coronavirus  (SARS-CoV-2) nucleic acids may be present in the submitted sample  additional confirmatory testing may be necessary for epidemiological  and / or clinical management purposes  to differentiate between  SARS-CoV-2 and other Sarbecovirus currently known to infect humans.  If clinically indicated additional testing with an alternate test  methodology [phone]) is advised. The SARS-CoV-2 RNA is generally  detectable in upper and lower respiratory sp ecimens during the acute  phase of infection. The expected result is Negative. Fact Sheet for Patients:  StrictlyIdeas.no Fact Sheet for Healthcare Providers: BankingDealers.co.za This test is not yet approved or cleared by the [place] FDA and has been authorized for detection and/or diagnosis of SARS-CoV-2 by FDA under an Emergency Use Authorization (EUA).  This EUA will remain in effect (meaning this test can be used) for the duration of the  COVID-19 declaration under Section 564(b)(1) of the Act, 21 U.S.C. section 360bbb-3(b)(1), unless the authorization is terminated or revoked sooner. Performed at [facility], [place] [address], [place], [place] [address]   Urine culture     Status: None (Preliminary result)   Collection Time: 06/22/18  5:42 AM  Result Value Ref Range Status   Specimen Description URINE, SUPRAPUBIC  Final   Special Requests NONE  Final   Culture   Final    CULTURE REINCUBATED FOR BETTER GROWTH Performed at [facility], [place] [address] [place], [place] [address]    Report Status PENDING  Incomplete  Expectorated sputum assessment w rflx to resp cult     Status: None   Collection Time: 06/22/18  8:48 AM  Result Value Ref Range Status   Specimen Description TRACHEAL ASPIRATE  Final   Special Requests NONE  Final   Sputum evaluation   Final    THIS SPECIMEN IS ACCEPTABLE FOR SPUTUM CULTURE Performed at [facility], [place] [address] [place], [place] [address]    Report Status 06/22/2018 FINAL  Final  Culture, respiratory     Status: None (Preliminary result)   Collection Time: 06/22/18  8:48 AM  Result Value Ref Range Status   Specimen Description TRACHEAL ASPIRATE  Final   Special Requests NONE Reflexed from [id number]  Final   Gram Stain   Final    ABUNDANT WBC PRESENT,BOTH PMN AND MONONUCLEAR FEW GRAM POSITIVE COCCI FEW GRAM NEGATIVE RODS RARE GRAM VARIABLE ROD Performed at [facility], [place] [address] [place], [place] [address]    Culture PENDING  Incomplete   Report Status PENDING  Incomplete  MRSA PCR Screening     Status: Abnormal   Collection Time: 06/22/18  1:13 PM  Result Value Ref Range Status   MRSA by PCR POSITIVE (A) NEGATIVE Final    Comment:        The GeneXpert MRSA Assay (FDA approved for NASAL specimens only), is one component of a comprehensive MRSA colonization surveillance program. It is not intended to diagnose MRSA infection nor to guide or  monitor treatment for MRSA infections. RESULT CALLED TO, READ BACK BY AND VERIFIED WITH: Shon Baton RN 15:15 06/22/18 ([email or web address]) Performed at [facility], [place] [address] [place], [place] [address]     Procedures and diagnostic studies:  Ct Head Wo  Contrast  Result Date: 06/22/2018 CLINICAL DATA:  Altered level of consciousness EXAM: CT HEAD WITHOUT CONTRAST TECHNIQUE: Contiguous axial images were obtained from the base of the skull through the vertex without intravenous contrast. COMPARISON:  02/14/2018 FINDINGS: Brain: No evidence of acute infarction, hemorrhage, hydrocephalus, extra-axial collection or mass lesion/mass effect. Mild for age cerebral volume loss Vascular: No hyperdense vessel or unexpected calcification. Skull: Normal. Negative for fracture or focal lesion. Sinuses/Orbits: Negative Other: Motion degraded IMPRESSION: Stable, negative head CT. Electronically Signed   By: Monte Fantasia M.D.   On: 06/22/2018 04:17   Dg Chest Port 1 View  Result Date: 06/22/2018 CLINICAL DATA:  Sepsis EXAM: PORTABLE CHEST 1 VIEW COMPARISON:  04/06/2018 FINDINGS: The tracheostomy tube terminates above the carina. The dual chamber left-sided pacemaker is stable in positioning. The heart size is stable. The lungs are clear. There is blunting of the right costophrenic angle which has improved from prior study. There is no pneumothorax. IMPRESSION: No active disease. Electronically Signed   By: Constance Holster M.D.   On: 06/22/2018 03:19    Medications:   . buPROPion  150 mg Oral Daily  . chlorhexidine  15 mL Mouth Rinse BID  . DULoxetine  30 mg Oral BID  . enoxaparin (LOVENOX) injection  40 mg Subcutaneous QHS  . famotidine  20 mg Per Tube QHS  . feeding supplement (GLUCERNA 1.2 CAL)  1,000 mL Per Tube Q24H  . feeding supplement (PRO-STAT SUGAR FREE 64)  30 mL Oral TID  . ferrous sulfate  220 mg Per Tube Q breakfast  . gabapentin  300 mg Oral TID  . insulin aspart  0-9 Units Subcutaneous  Q4H  . insulin glargine  5 Units Subcutaneous QHS  . LORazepam  0.5 mg Oral Q1400  . mouth rinse  15 mL Mouth Rinse q12n4p  . metoCLOPramide  5 mg Oral QID  . midodrine  10 mg Oral TID  . polyethylene glycol  17 g Oral Daily  . sodium chloride flush  3 mL Intravenous Once  . sodium chloride flush  3 mL Intravenous Q12H  . umeclidinium bromide  1 puff Inhalation Daily  . zolpidem  2.5 mg Oral QHS   Continuous Infusions: . sodium chloride 1,000 mL (06/22/18 1726)  . ceFEPime (MAXIPIME) IV 1 g (06/23/18 0516)  . vancomycin       LOS: 1 day   [facility] Pager [phone]. If unable to reach me by pager, please call my cell phone at [phone].  *Please refer to [email or web address], password [email or web address] to get updated schedule on who will round on this patient, as hospitalists switch teams weekly. If 7PM-7AM, please contact night-coverage at [email or web address], password [email or web address] for any overnight needs.  06/23/2018, 7:38 AM

## 2018-06-23 NOTE — Progress Notes (Signed)
Daily Nursing Note  Received report from [place], [place]. Introduced self to patient who was in no distress at the time of assessment. Alert and oriented x4 which was an improvement as compared to the day prior. GT feeding stopped d/t feelings of fullness. Suprapubic catheter exchanged overnight. Treated to pain with oxycodone and spasms with baclofen. Swallow evaluation done, patient on dysphagia III diet, able to tolerate oral medication whole. All patients needs have been met today.   DC Plan: Awaiting urine cx results for abx refinement. Discharge plan is back to [place] once an antibiotic regiment is solidified.

## 2018-06-24 LAB — GLUCOSE, CAPILLARY
Glucose-Capillary: 164 mg/dL — ABNORMAL HIGH (ref 70–99)
Glucose-Capillary: 190 mg/dL — ABNORMAL HIGH (ref 70–99)
Glucose-Capillary: 174 mg/dL — ABNORMAL HIGH (ref 70–99)
Glucose-Capillary: 246 mg/dL — ABNORMAL HIGH (ref 70–99)
Glucose-Capillary: 214 mg/dL — ABNORMAL HIGH (ref 70–99)
Glucose-Capillary: 222 mg/dL — ABNORMAL HIGH (ref 70–99)

## 2018-06-24 LAB — CALCIUM, IONIZED: Calcium, Ionized, Serum: 5 mg/dL (ref 4.5–5.6)

## 2018-06-24 LAB — BETA HCG QUANT (REF LAB): hCG Quant: 5 m[IU]/mL

## 2018-06-24 MED ORDER — CHLORHEXIDINE GLUCONATE CLOTH 2 % EX PADS
6.0000 | MEDICATED_PAD | Freq: Every day | CUTANEOUS | Status: DC
  Administered 2018-06-25 – 2018-06-26 (×2): 6 via TOPICAL

## 2018-06-24 MED ORDER — MUPIROCIN 2 % EX OINT
1.0000 "application " | TOPICAL_OINTMENT | Freq: Two times a day (BID) | CUTANEOUS | Status: DC
  Administered 2018-06-24 – 2018-06-27 (×7): 1 via NASAL
  Filled 2018-06-24: qty 22

## 2018-06-24 MED ORDER — ZOLPIDEM TARTRATE 5 MG PO TABS
5.0000 mg | ORAL_TABLET | Freq: Every day | ORAL | Status: DC
  Administered 2018-06-24 – 2018-06-26 (×3): 5 mg via ORAL
  Filled 2018-06-24 (×4): qty 1

## 2018-06-24 MED ORDER — INSULIN GLARGINE 100 UNIT/ML ~~LOC~~ SOLN
15.0000 [IU] | Freq: Every day | SUBCUTANEOUS | Status: DC
  Administered 2018-06-24 – 2018-06-26 (×3): 15 [IU] via SUBCUTANEOUS
  Filled 2018-06-24 (×4): qty 0.15

## 2018-06-24 NOTE — TOC Initial Note (Signed)
Transition of Care [facility]) - Initial/Assessment Note    Patient Details  Name: Angela Page MRN: [id number] Date of Birth: 1970-11-03  [facility]) CM/SW Contact:    Zenon Mayo, RN Phone Number: 06/24/2018, 3:55 PM  Clinical Narrative:                 From [facility] SNF, AMS, Sepsis , UTI, has chronic trach/peg, plan to return to SNF.  Expected Discharge Plan: [place] Barriers to Discharge: No Barriers Identified   Patient Goals and CMS Choice     Choice offered to / list presented to : NA  Expected Discharge Plan and Services Expected Discharge Plan: [place] In-house Referral: Clinical Social Work Discharge Planning Services: CM Consult Post Acute Care Choice: Nursing Home Living arrangements for the past 2 months: [place]                                      Prior Living Arrangements/Services Living arrangements for the past 2 months: [place]   Patient language and need for interpreter reviewed:: Yes        Need for Family Participation in Patient Care: No (Comment) Care giver support system in place?: No (comment)   Criminal Activity/Legal Involvement Pertinent to Current Situation/Hospitalization: No - Comment as needed  Activities of Daily Living Home Assistive Devices/Equipment: Hospital bed, [employer], Bathtub lift ADL Screening (condition at time of admission) Patient's cognitive ability adequate to safely complete daily activities?: Yes Is the patient deaf or have difficulty hearing?: No Does the patient have difficulty seeing, even when wearing glasses/contacts?: No Does the patient have difficulty concentrating, remembering, or making decisions?: Yes Patient able to express need for assistance with ADLs?: No Does the patient have difficulty dressing or bathing?: Yes Independently performs ADLs?: Yes (appropriate for developmental age) Does the  patient have difficulty walking or climbing stairs?: Yes Weakness of Legs: Both Weakness of Arms/Hands: Both  Permission Sought/Granted                  Emotional Assessment              Admission diagnosis:  Sepsis ([place]) [A41.9] Sepsis secondary to UTI ([place]) [A41.9, N39.0] Patient Active Problem List   Diagnosis Date Noted  . Hypercalcemia 06/23/2018  . Thrombocytosis ([place]) 06/22/2018  . Chronic respiratory failure with hypoxia ([place])   . Acute on chronic respiratory failure ([place])   . Quadriplegia and quadriparesis ([place])   . At high risk for severe sepsis   . Decubitus ulcer of buttock, unstageable ([place])   . Lactic acid acidosis 12/16/2017  . Suprapubic catheter ([place]) 12/16/2017  . Pressure injury of skin 07/17/2017  . Kidney stone 07/16/2017  . Right ureteral stone 03/10/2017  . Complicated UTI (urinary tract infection) 03/09/2017  . Ineffective airway clearance   . Sepsis secondary to UTI ([place]) 06/30/2016  . Autonomic neuropathy 06/10/2016  . Presence of permanent cardiac pacemaker 06/10/2016  . 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 Chronically on opiate therapy 06/10/2016  . PEG (percutaneous endoscopic gastrostomy) status ([place]) 06/10/2016  . Pulmonary hypertension ([place]) 06/10/2016  . Hypoalbuminemia 06/10/2016  . Peripheral edema   . Tracheostomy dependence ([place])   . Chronic obstructive pulmonary disease ([place])   . Chronic diastolic CHF (congestive heart failure) ([place]) 01/23/2016  . Tracheostomy status ([place])   . Esophageal dysphagia   .  Malnutrition of moderate degree 11/01/2015  . Tobacco abuse 10/31/2015  . Asthma 10/31/2015  . History of pneumonia 10/08/2015  . Pressure ulcer of contiguous region involving buttock and hip, stage 4 ([place]) 02/26/2015  . Normocytic normochromic anemia 02/25/2015  . Acute metabolic encephalopathy 12/75/1700  . Leukocytosis 10/23/2014  . Hypokalemia 10/23/2014  . Decubitus ulcer of sacral region, stage 4  ([place]) 10/23/2014  . Anemia, iron deficiency 10/23/2014  . Quadriplegia, C5-C7 incomplete ([place]) 10/22/2014  . Depression   . Protein-calorie malnutrition, severe ([place]) 09/05/2014  . 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 Recurrent UTI 03/31/2014  . Pressure ulcer of coccygeal region, stage 4 ([place]) 01/06/2014  . Stage 4 skin ulcer of sacral region ([place]) 01/06/2014  . Neuropathic pain 11/30/2013  . Paraplegia following spinal cord injury ([place]) 11/30/2013  . Spinal cord injury, cervical region ([place]) 11/30/2013  . Chronic pain due to injury 11/30/2013  . S/P cervical spinal fusion 10/27/2013  . Tracheostomy care ([place]) 10/27/2013  . 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 SCI (spinal cord injury) 10/11/2013   PCP:  Default, Provider, MD Pharmacy:  No Pharmacies Listed    Social Determinants of Health (SDOH) Interventions    Readmission Risk Interventions No flowsheet data found.

## 2018-06-24 NOTE — Progress Notes (Addendum)
Progress Note    Angela Page  NID:[id number] DOB: [birth year]  DOA: 06/22/2018 PCP: Default, Provider, MD    Brief Narrative:   Chief complaint: F/U AMS  Medical records reviewed and are as summarized below:  Angela Page is an 48 y.o. female with a PMH of traumatic cervical spine fracture resulting in quadriplegia, status post trach and PEG, suprapubic catheter, COPD with chronic hypoxic respiratory failure, chronic hypotension, IDDM, chronic pain, sick sinus syndrome status post pacemaker, chronic normocytic anemia and chronic decubitus ulcers who was admitted from her skilled nursing facility on 06/22/2018 for evaluation of AMS.  She has a longstanding history of multiple hospitalizations for treatment of infections with healthcare associated organisms.  Upon initial evaluation in the ED, her chest x-ray was clear but urinalysis showed a large amount of leukocytes.  She was given a liter of fluids and started on empiric antibiotics with cefepime, metronidazole and vancomycin.  Assessment/Plan:   Principal Problems:   Sepsis secondary to complicated UTI/suprapubic catheter ([place]) with leukocytosis and lactic acidosis, present on admission Initially presented with tachycardia, tachypnea, leukocytosis and lactic acidosis concerning for sepsis from a urinary source.  Chest x-ray clear.  Given history of healthcare acquired organisms, she was broadly covered with cefepime, metronidazole and vancomycin after receiving fluid volume resuscitation.  Urine, blood and sputum cultures were sent.  Lactic acidosis resolved with IV fluid resuscitation.  COVID-19 testing was negative.  Procalcitonin elevated at 0.30, suggesting we should maintain IV antibiotics and given this patient's h/o healthcare acquired organisms, will need to await cultures before it is safe to narrow.  Cultures pending at this time and blood cultures negative to date.  Patient is afebrile. Will need to monitor renal  function closely while on Vancomycin. BMET ordered for a.m.    Acute metabolic encephalopathy CT of the head personally reviewed and is unremarkable.  Acute metabolic encephalopathy likely secondary to sepsis/infection.  Treating as above.  Mental status back to baseline.  Active Problems:   Asthma/COPD with acute on chronic respiratory failure status post tracheostomy Uses 3 L of supplemental oxygen at baseline.  On admission, she was requiring up to 10 L oxygen saturations currently stable.  Continue respiratory care for tracheostomy management and albuterol nebulizers as needed for shortness of breath/wheezing.  Continue Spiriva and Ellipta.  Back to baseline oxygen requirement.    Hypercalcemia  Calcium level was elevated to 10.7 on admission and she was hydrated vigorously.  Calcium 9.9 this morning.  When corrected for low albumin, calcium level still 10.7.  Continue IV fluids and monitor.  Ionized calcium is still pending.    Type 2 diabetes, uncontrolled, with long-term insulin use Patient had marked elevation of blood glucoses on admission and most recent hemoglobin A1c was 9.4% indicating suboptimal control.  Currently being managed with Lantus 10 units and SSI moderate scale.  CBGs 164-241.  Will increase Lantus to 15 units.    Metabolic acidosis with elevated anion gap Anion gap 18 on admission with a bicarb of 28.  Possibly from lactic acidosis though the patient did have marked elevation of blood glucoses and could have been an early DKA.  Anion gap improved to 14 on 06/23/2018 with IV fluid hydration and resolution of lactic acidosis.    Hypokalemia Repleted.    Depression/anxiety Continue Wellbutrin and Ativan.    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  Continue gabapentin and Cymbalta.  Blood pressure chronically low.  Continue midodrine  and baclofen for muscle spasms. Reduce pressure with mattress  overlay.    Decubitus ulcer of sacral region, stage 4 [facility]) present on admission Wound care per wound care nurse.    Normocytic normochromic anemia Stable.  Continue ferrous sulfate.    Prolonged QT interval Given magnesium sulfate.  Monitoring on telemetry, no dangerous arrhythmias detected.  Keep electrolytes normal.    Thrombocytosis ([place]) Likely an acute phase reactant to infection.  Platelet count 510 on admission.    Chronic dysphasia status post PEG Patient reports that she eats pured foods at baseline.  Evaluated by speech therapist and diet advanced to dysphagia 1.    MRSA carrier  On contact precautions.    Severe protein calorie malnutrition/hypoalbuminemia Body mass index is 18.55 kg/m.  Dietitian following.  Continue tube feeds and dysphagia 1 diet.   Family Communication/Anticipated D/C date and plan/Code Status   DVT prophylaxis: Lovenox ordered. Code Status: Full Code.  Family Communication: Mother updated by telephone 06/23/18. Message left 06/24/18. Disposition Plan: From [facility]. D/C back 06/25/18 if culture data resulted.   Medical Consultants:    None.   Anti-Infectives:    Vancomycin 06/22/2018--->  Flagyl 06/22/2018--->  Cefepime 06/22/2018--->  Subjective:   Ms. Sons feels well overall although she continues to report thick tracheal secretions and need for frequent suctioning. No nausea/vomiting. Continues to have chronic pain and reports issues with being able to sleep.  Objective:    Vitals:   06/23/18 2307 06/23/18 2344 06/24/18 0333 06/24/18 0350  BP: (!) 144/78  (!) 141/81   Pulse: 80 80  80  Resp: 16 19 (!) 21 17  Temp: 98.3 F (36.8 C)  98.4 F (36.9 C)   TempSrc: Oral  Oral   SpO2: 98% 100% 97% 100%  Weight:      Height:        Intake/Output Summary (Last 24 hours) at 06/24/2018 0815 Last data filed at 06/24/2018 0747 Gross per 24 hour  Intake 2271 ml  Output 1780 ml  Net 491 ml   Filed Weights   06/22/18 0330    Weight: 46 kg    Exam: General: No acute distress. Cardiovascular: Heart sounds show a regular rate, and rhythm. No gallops or rubs. No murmurs. No JVD. Lungs:Trach present, occasional rhonchi.  Abdomen: Soft, nontender, nondistended with normal active bowel sounds. No masses. No hepatosplenomegaly. PEG present. Skin: Warm and dry. Stage IV decubitus wounds to sacrum/bilateral ischia. Extremities: No clubbing or cyanosis. No edema. Pedal pulses 2+. + Foot drop.   Data Reviewed:   I have personally reviewed following labs and imaging studies:  Labs: Labs show the following:   Basic Metabolic Panel: Recent Labs  Lab 06/22/18 0219 06/23/18 0442  NA 136 143  K 3.7 3.4*  CL 90* 103  CO2 28 26  GLUCOSE 398* 318*  BUN 37* 35*  CREATININE 0.73 0.61  CALCIUM 10.7* 9.9   GFR Estimated Creatinine Clearance: 63.1 mL/min (by C-G formula based on SCr of 0.61 mg/dL). Liver Function Tests: Recent Labs  Lab 06/22/18 0219 06/23/18 0442  AST 26 16  ALT 40 23  ALKPHOS 265* 184*  BILITOT 0.5 0.5  PROT 9.0* 7.1  ALBUMIN 3.6 3.0*   Coagulation profile Recent Labs  Lab 06/22/18 0219  INR 1.0    CBC: Recent Labs  Lab 06/22/18 0219 06/23/18 0442  WBC 17.1* 15.1*  NEUTROABS 13.9*  --   HGB 10.2* 8.5*  HCT 32.1* 27.8*  MCV 91.7 94.9  PLT  510* 368   CBG: Recent Labs  Lab 06/23/18 1538 06/23/18 1920 06/23/18 2306 06/24/18 0329 06/24/18 0731  GLUCAP 241* 177* 196* 164* 190*   Sepsis Labs: Recent Labs  Lab 06/22/18 0219 06/22/18 0937 06/22/18 1416 06/23/18 0442  PROCALCITON 0.30  --   --   --   WBC 17.1*  --   --  15.1*  LATICACIDVEN 2.1* 1.3 1.7  --     Microbiology Recent Results (from the past 240 hour(s))  Culture, blood (Routine x 2)     Status: None (Preliminary result)   Collection Time: 06/22/18  2:20 AM  Result Value Ref Range Status   Specimen Description BLOOD LEFT HAND  Final   Special Requests   Final    BOTTLES DRAWN AEROBIC AND ANAEROBIC  Blood Culture results may not be optimal due to an inadequate volume of blood received in culture bottles   Culture   Final    NO GROWTH 2 DAYS Performed at [facility], [place] [address], [place], [place] [address]    Report Status PENDING  Incomplete  Culture, blood (Routine x 2)     Status: None (Preliminary result)   Collection Time: 06/22/18  2:50 AM  Result Value Ref Range Status   Specimen Description BLOOD RIGHT HAND  Final   Special Requests   Final    BOTTLES DRAWN AEROBIC AND ANAEROBIC Blood Culture adequate volume   Culture   Final    NO GROWTH 2 DAYS Performed at [facility], [place] [address], [place], [place] [address]    Report Status PENDING  Incomplete  SARS Coronavirus 2 (CEPHEID - Performed in [place] hospital lab), Hosp Order     Status: None   Collection Time: 06/22/18  4:35 AM  Result Value Ref Range Status   SARS Coronavirus 2 NEGATIVE NEGATIVE Final    Comment: (NOTE) If result is NEGATIVE SARS-CoV-2 target nucleic acids are NOT DETECTED. The SARS-CoV-2 RNA is generally detectable in upper and lower  respiratory specimens during the acute phase of infection. The lowest  concentration of SARS-CoV-2 viral copies this assay can detect is 250  copies / mL. A negative result does not preclude SARS-CoV-2 infection  and should not be used as the sole basis for treatment or other  patient management decisions.  A negative result may occur with  improper specimen collection / handling, submission of specimen other  than nasopharyngeal swab, presence of viral mutation(s) within the  areas targeted by this assay, and inadequate number of viral copies  (<250 copies / mL). A negative result must be combined with clinical  observations, patient history, and epidemiological information. If result is POSITIVE SARS-CoV-2 target nucleic acids are DETECTED. The SARS-CoV-2 RNA is generally detectable in upper and lower  respiratory specimens dur ing the acute  phase of infection.  Positive  results are indicative of active infection with SARS-CoV-2.  Clinical  correlation with patient history and other diagnostic information is  necessary to determine patient infection status.  Positive results do  not rule out bacterial infection or co-infection with other viruses. If result is PRESUMPTIVE POSTIVE SARS-CoV-2 nucleic acids MAY BE PRESENT.   A presumptive positive result was obtained on the submitted specimen  and confirmed on repeat testing.  While 2019 novel coronavirus  (SARS-CoV-2) nucleic acids may be present in the submitted sample  additional confirmatory testing may be necessary for epidemiological  and / or clinical management purposes  to differentiate between  SARS-CoV-2 and other Sarbecovirus currently known to infect humans.  If clinically indicated additional testing with an alternate test  methodology [phone]) is advised. The SARS-CoV-2 RNA is generally  detectable in upper and lower respiratory sp ecimens during the acute  phase of infection. The expected result is Negative. Fact Sheet for Patients:  StrictlyIdeas.no Fact Sheet for Healthcare Providers: BankingDealers.co.za This test is not yet approved or cleared by the [place] FDA and has been authorized for detection and/or diagnosis of SARS-CoV-2 by FDA under an Emergency Use Authorization (EUA).  This EUA will remain in effect (meaning this test can be used) for the duration of the COVID-19 declaration under Section 564(b)(1) of the Act, 21 U.S.C. section 360bbb-3(b)(1), unless the authorization is terminated or revoked sooner. Performed at [facility], [place] [address] [place], [place] [address]   Urine culture     Status: None (Preliminary result)   Collection Time: 06/22/18  5:42 AM  Result Value Ref Range Status   Specimen Description URINE, SUPRAPUBIC  Final   Special Requests NONE  Final   Culture    Final    CULTURE REINCUBATED FOR BETTER GROWTH Performed at [facility], [place] [address] [place], [place] [address]    Report Status PENDING  Incomplete  Expectorated sputum assessment w rflx to resp cult     Status: None   Collection Time: 06/22/18  8:48 AM  Result Value Ref Range Status   Specimen Description TRACHEAL ASPIRATE  Final   Special Requests NONE  Final   Sputum evaluation   Final    THIS SPECIMEN IS ACCEPTABLE FOR SPUTUM CULTURE Performed at [facility], [place] [address], [place], [place] [address]    Report Status 06/22/2018 FINAL  Final  Culture, respiratory     Status: None (Preliminary result)   Collection Time: 06/22/18  8:48 AM  Result Value Ref Range Status   Specimen Description TRACHEAL ASPIRATE  Final   Special Requests NONE Reflexed from [id number]  Final   Gram Stain   Final    ABUNDANT WBC PRESENT,BOTH PMN AND MONONUCLEAR FEW GRAM POSITIVE COCCI FEW GRAM NEGATIVE RODS RARE GRAM VARIABLE ROD    Culture   Final    FEW STAPHYLOCOCCUS AUREUS SUSCEPTIBILITIES TO FOLLOW FEW GROUP B STREP(S.AGALACTIAE)ISOLATED TESTING AGAINST S. AGALACTIAE NOT ROUTINELY PERFORMED DUE TO PREDICTABILITY OF AMP/PEN/VAN SUSCEPTIBILITY. Performed at [facility], [place] [address] [place], [place] [address]    Report Status PENDING  Incomplete  MRSA PCR Screening     Status: Abnormal   Collection Time: 06/22/18  1:13 PM  Result Value Ref Range Status   MRSA by PCR POSITIVE (A) NEGATIVE Final    Comment:        The GeneXpert MRSA Assay (FDA approved for NASAL specimens only), is one component of a comprehensive MRSA colonization surveillance program. It is not intended to diagnose MRSA infection nor to guide or monitor treatment for MRSA infections. RESULT CALLED TO, READ BACK BY AND VERIFIED WITH: Shon Baton RN 15:15 06/22/18 ([email or web address]) Performed at [facility], [place] [address], [place], [place] [address]     Procedures and diagnostic studies:  No  results found.  Medications:    buPROPion  150 mg Oral Daily   chlorhexidine  15 mL Mouth Rinse BID   DULoxetine  30 mg Oral BID   enoxaparin (LOVENOX) injection  40 mg Subcutaneous QHS   famotidine  20 mg Per Tube QHS  feeding supplement (GLUCERNA 1.2 CAL)  1,000 mL Per Tube Q24H   feeding supplement (PRO-STAT SUGAR FREE 64)  30 mL Oral TID   ferrous sulfate  220 mg Per Tube Q breakfast   gabapentin  300 mg Oral TID   insulin aspart  0-15 Units Subcutaneous Q4H   insulin glargine  10 Units Subcutaneous QHS   LORazepam  0.5 mg Oral Q1400   mouth rinse  15 mL Mouth Rinse q12n4p   metoCLOPramide  5 mg Oral QID   midodrine  10 mg Oral TID   polyethylene glycol  17 g Oral Daily   sodium chloride flush  3 mL Intravenous Once   sodium chloride flush  3 mL Intravenous Q12H   umeclidinium bromide  1 puff Inhalation Daily   zolpidem  2.5 mg Oral QHS   Continuous Infusions:  sodium chloride 1,000 mL (06/22/18 1726)   ceFEPime (MAXIPIME) IV 1 g (06/24/18 0636)   vancomycin 1,000 mg (06/24/18 0712)     LOS: 2 days   Jacquelynn Cree  [facility] Pager [phone]. If unable to reach me by pager, please call my cell phone at [phone].  *Please refer to [email or web address], password [email or web address] to get updated schedule on who will round on this patient, as hospitalists switch teams weekly. If 7PM-7AM, please contact night-coverage at [email or web address], password [email or web address] for any overnight needs.  06/24/2018, 8:15 AM

## 2018-06-24 NOTE — Clinical Social Work Note (Addendum)
Patient from [facility]. CSW advised that Angela Page will be ready for discharge on Friday. Per Gayla Medicus, hospital liaison, she will need another COVID test if she does not discharge on today. Contact made with charge nurse and requested a COVID tet.  Angela Page, MSW, LCSW Licensed Clinical Social Worker [place] [phone]

## 2018-06-25 DIAGNOSIS — D509 Iron deficiency anemia, unspecified: Secondary | ICD-10-CM

## 2018-06-25 DIAGNOSIS — Z931 Gastrostomy status: Secondary | ICD-10-CM

## 2018-06-25 LAB — GLUCOSE, CAPILLARY
Glucose-Capillary: 153 mg/dL — ABNORMAL HIGH (ref 70–99)
Glucose-Capillary: 165 mg/dL — ABNORMAL HIGH (ref 70–99)
Glucose-Capillary: 174 mg/dL — ABNORMAL HIGH (ref 70–99)
Glucose-Capillary: 175 mg/dL — ABNORMAL HIGH (ref 70–99)
Glucose-Capillary: 189 mg/dL — ABNORMAL HIGH (ref 70–99)
Glucose-Capillary: 243 mg/dL — ABNORMAL HIGH (ref 70–99)

## 2018-06-25 LAB — BASIC METABOLIC PANEL
Anion gap: 11 (ref 5–15)
BUN: 31 mg/dL — ABNORMAL HIGH (ref 6–20)
CO2: 23 mmol/L (ref 22–32)
Calcium: 9.7 mg/dL (ref 8.9–10.3)
Chloride: 101 mmol/L (ref 98–111)
Creatinine, Ser: 0.51 mg/dL (ref 0.44–1.00)
GFR calc Af Amer: >60 mL/min (ref >60)
GFR calc non Af Amer: >60 mL/min (ref >60)
Glucose, Bld: 237 mg/dL — ABNORMAL HIGH (ref 70–99)
Potassium: 4.5 mmol/L (ref 3.5–5.1)
Sodium: 135 mmol/L (ref 135–145)

## 2018-06-25 LAB — CULTURE, RESPIRATORY W GRAM STAIN

## 2018-06-25 MED ORDER — INSULIN ASPART 100 UNIT/ML ~~LOC~~ SOLN
0.0000 [IU] | SUBCUTANEOUS | Status: DC
  Administered 2018-06-25: 7 [IU] via SUBCUTANEOUS
  Administered 2018-06-25 (×3): 4 [IU] via SUBCUTANEOUS
  Administered 2018-06-26: 3 [IU] via SUBCUTANEOUS
  Administered 2018-06-26 (×3): 4 [IU] via SUBCUTANEOUS
  Administered 2018-06-26: 3 [IU] via SUBCUTANEOUS
  Administered 2018-06-26 – 2018-06-27 (×3): 4 [IU] via SUBCUTANEOUS

## 2018-06-25 MED ORDER — OXYCODONE HCL 5 MG PO TABS
5.0000 mg | ORAL_TABLET | Freq: Four times a day (QID) | ORAL | Status: DC | PRN
  Administered 2018-06-25 – 2018-06-27 (×6): 5 mg via ORAL
  Filled 2018-06-25 (×6): qty 1

## 2018-06-25 NOTE — Care Management Important Message (Signed)
Important Message  Patient Details  Name: Angela Page MRN: [id number] Date of Birth: 08/23/1970   Medicare Important Message Given:  Yes    Zenon Mayo, RN 06/25/2018, 2:52 PM

## 2018-06-25 NOTE — Progress Notes (Signed)
Inpatient Diabetes Program Recommendations  AACE/ADA: New Consensus Statement on Inpatient Glycemic Control (2015)  Target Ranges:  Prepandial:   less than 140 mg/dL      Peak postprandial:   less than 180 mg/dL (1-2 hours)      Critically ill patients:  140 - 180 mg/dL   Lab Results  Component Value Date   GLUCAP 153 (H) 06/25/2018   HGBA1C 8.5 (H) 01/02/2018    Review of Glycemic Control Results for Angela Page, Angela Page (MRN [id number]) as of 06/25/2018 12:10  Ref. Range 06/24/2018 19:24 06/24/2018 23:28 06/25/2018 03:06 06/25/2018 08:08 06/25/2018 12:07  Glucose-Capillary Latest Ref Range: 70 - 99 mg/dL 214 (H) 222 (H) 175 (H) 243 (H) 153 (H)   Diabetes history: DM 2 Outpatient Diabetes medications:  Trulicity 1.5 mg q Friday, Novolog 0-10 units q 4 hours, Basaglar 5 units q HS Current orders for Inpatient glycemic control:  Novolog resistant q 4 hours Glucerna 1.2 cal. q 4 hours Lantus 15 units q HS  Inpatient Diabetes Program Recommendations:    Note that blood sugars>goal.  Novolog correction increased to resistant.  May consider adding Novolog tube feed coverage 2 units q 4 hours if blood sugars remain>180 mg/dL.   Thanks  Adah Perl, RN, BC-ADM Inpatient Diabetes Coordinator Pager [phone] (8a-5p)

## 2018-06-25 NOTE — TOC Progression Note (Signed)
Transition of Care [facility]) - Progression Note    Patient Details  Name: Soyla Bainter MRN: [id number] Date of Birth: Jan 26, 1970  [facility] CM/SW [place], LCSW Phone Number: 06/25/2018, 1:28 PM  Clinical Narrative: Patient's trach still at 40%. She has to be at 28% before she can return to [facility].  Expected Discharge Plan: Skilled Nursing Facility Barriers to Discharge: No Barriers Identified  Expected Discharge Plan and Services Expected Discharge Plan: [place] In-house Referral: Clinical Social Work Discharge Planning Services: CM Consult Post Acute Care Choice: Nursing Home Living arrangements for the past 2 months: [place]                                       Social Determinants of Health (SDOH) Interventions    Readmission Risk Interventions Readmission Risk Prevention Plan 06/24/2018  Transportation Screening Complete  Medication Review [employer]) Complete  HRI or Home Care Consult Complete  SW Recovery Care/Counseling Consult Complete  Palliative Care Screening Not Applicable  Skilled Nursing Facility Complete  Some recent data might be hidden

## 2018-06-25 NOTE — Progress Notes (Signed)
Progress Note    Angela Page  MWN:[id number] DOB: 1970/08/31  DOA: 06/22/2018 PCP: Default, Provider, MD    Brief Narrative:   Chief complaint: F/U AMS  Medical records reviewed and are as summarized below:  Angela Page is an 48 y.o. female with a PMH of traumatic cervical spine fracture resulting in quadriplegia, status post trach and PEG, suprapubic catheter, COPD with chronic hypoxic respiratory failure, chronic hypotension, IDDM, chronic pain, sick sinus syndrome status post pacemaker, chronic normocytic anemia and chronic decubitus ulcers who was admitted from her skilled nursing facility on 06/22/2018 for evaluation of AMS.  She has a longstanding history of multiple hospitalizations for treatment of infections with healthcare associated organisms.  Upon initial evaluation in the ED, her chest x-ray was clear but urinalysis showed a large amount of leukocytes.  She was given a liter of fluids and started on empiric antibiotics with cefepime, metronidazole and vancomycin.  Assessment/Plan:   Principal Problems:   Sepsis secondary to complicated UTI/suprapubic catheter ([place]) with leukocytosis and lactic acidosis, present on admission Initially presented with tachycardia, tachypnea, leukocytosis and lactic acidosis concerning for sepsis from a urinary source.  Chest x-ray clear.  Given history of healthcare acquired organisms, she was broadly covered with cefepime, metronidazole and vancomycin after receiving fluid volume resuscitation.  Urine, blood and sputum cultures were sent.  Lactic acidosis resolved with IV fluid resuscitation.  COVID-19 testing was negative.  Procalcitonin elevated at 0.30, suggesting we should maintain IV antibiotics and given this patient's h/o healthcare acquired organisms, will need to await cultures before it is safe to narrow.  Cultures pending at this time and blood cultures negative to date.  Patient is afebrile. Will need to monitor renal  function closely while on Vancomycin. Renal function stable this morning. Awaiting sensitivities on enterococcus.    Acute metabolic encephalopathy CT of the head personally reviewed and is unremarkable.  Acute metabolic encephalopathy likely secondary to sepsis/infection.  Treating as above.  Mental status back to baseline.  Active Problems:   Asthma/COPD with acute on chronic respiratory failure status post tracheostomy Uses 3 L of supplemental oxygen at baseline.  On admission, she was requiring up to 10 L oxygen saturations currently stable.  Continue respiratory care for tracheostomy management and albuterol nebulizers as needed for shortness of breath/wheezing.  Continue Spiriva and Ellipta.  Back to baseline oxygen requirement.    Hypercalcemia  Calcium level was elevated to 10.7 on admission and she was hydrated vigorously.  Calcium 9.9 this morning.  When corrected for low albumin, calcium level still 10.7.  Continue IV fluids and monitor.  Ionized calcium WNL at 5.0.    Type 2 diabetes, uncontrolled, with long-term insulin use Patient had marked elevation of blood glucoses on admission and most recent hemoglobin A1c was 9.4% indicating suboptimal control.  Currently being managed with Lantus 15 units and SSI moderate scale.  CBGs 175-246.  Will change SSI to insulin resistant scale.    Metabolic acidosis with elevated anion gap Anion gap 18 on admission with a bicarb of 28.  Possibly from lactic acidosis though the patient did have marked elevation of blood glucoses and could have been an early DKA.  Anion gap improved to 14 on 06/23/2018 with IV fluid hydration and resolution of lactic acidosis.    Hypokalemia Repleted.    Depression/anxiety Continue Wellbutrin and Ativan.    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  Continue gabapentin and Cymbalta.  Blood  pressure chronically low.  Continue midodrine and  baclofen for muscle spasms. Reduce pressure with mattress overlay.    Decubitus ulcer of sacral region, stage 4 [facility]) present on admission Wound care per wound care nurse.    Normocytic normochromic anemia Stable.  Continue ferrous sulfate.    Prolonged QT interval Given magnesium sulfate.  Monitoring on telemetry, no dangerous arrhythmias detected.  Keep electrolytes normal. D/C tele.    Thrombocytosis ([place]) Likely an acute phase reactant to infection.  Platelet count 510 on admission.    Chronic dysphasia status post PEG Patient reports that she eats pured foods at baseline.  Evaluated by speech therapist and diet advanced to dysphagia 1.    MRSA carrier  On contact precautions.    Severe protein calorie malnutrition/hypoalbuminemia Body mass index is 18.55 kg/m.  Dietitian following.  Continue tube feeds and dysphagia 1 diet.   Family Communication/Anticipated D/C date and plan/Code Status   DVT prophylaxis: Lovenox ordered. Code Status: Full Code.  Family Communication: Mother updated by telephone 06/23/18. Message left 06/24/18. Disposition Plan: From [facility]. D/C back 06/25/18 if culture data resulted.   Medical Consultants:    None.   Anti-Infectives:    Vancomycin 06/22/2018--->  Flagyl 06/22/2018--->  Cefepime 06/22/2018--->  Subjective:   Ms. Bennie still reports thick secretions, but says they are "better". No complaints of nausea, vomiting or diarrhea. No SOB or cough.   Objective:    Vitals:   06/25/18 0308 06/25/18 0317 06/25/18 0349 06/25/18 0811  BP: (!) 142/105  (!) 134/92 137/89  Pulse: 80 80 80 79  Resp: (!) 80 14 15 15   Temp: 97.6 F (36.4 C)   98.2 F (36.8 C)  TempSrc: Oral   Oral  SpO2: 100% 100% 100% 98%  Weight:      Height:        Intake/Output Summary (Last 24 hours) at 06/25/2018 0818 Last data filed at 06/25/2018 0400 Gross per 24 hour  Intake 770 ml  Output 3375 ml  Net -2605 ml   Filed Weights   06/22/18 0330   Weight: 46 kg    Exam: General: No acute distress. Cardiovascular: Heart sounds show a regular rate, and rhythm. No gallops or rubs. No murmurs. No JVD. Lungs: Trach present. Clear to auscultation bilaterally with good air movement. No rales, rhonchi or wheezes. Abdomen: Soft, nontender, nondistended with normal active bowel sounds. No masses. No hepatosplenomegaly. PEG present. Skin: Warm and dry. No rashes or lesions. Extremities: No clubbing or cyanosis. Trace edema. Pedal pulses 2+. Foot drop.  Data Reviewed:   I have personally reviewed following labs and imaging studies:  Labs: Labs show the following:   Basic Metabolic Panel: Recent Labs  Lab 06/22/18 0219 06/23/18 0442 06/25/18 0638  NA 136 143 135  K 3.7 3.4* 4.5  CL 90* 103 101  CO2 28 26 23   GLUCOSE 398* 318* 237*  BUN 37* 35* 31*  CREATININE 0.73 0.61 0.51  CALCIUM 10.7* 9.9 9.7   GFR Estimated Creatinine Clearance: 63.1 mL/min (by C-G formula based on SCr of 0.51 mg/dL). Liver Function Tests: Recent Labs  Lab 06/22/18 0219 06/23/18 0442  AST 26 16  ALT 40 23  ALKPHOS 265* 184*  BILITOT 0.5 0.5  PROT 9.0* 7.1  ALBUMIN 3.6 3.0*   Coagulation profile Recent Labs  Lab 06/22/18 0219  INR 1.0    CBC: Recent Labs  Lab 06/22/18 0219 06/23/18 0442  WBC 17.1* 15.1*  NEUTROABS 13.9*  --  HGB 10.2* 8.5*  HCT 32.1* 27.8*  MCV 91.7 94.9  PLT 510* 368   CBG: Recent Labs  Lab 06/24/18 1539 06/24/18 1924 06/24/18 2328 06/25/18 0306 06/25/18 0808  GLUCAP 246* 214* 222* 175* 243*   Sepsis Labs: Recent Labs  Lab 06/22/18 0219 06/22/18 0937 06/22/18 1416 06/23/18 0442  PROCALCITON 0.30  --   --   --   WBC 17.1*  --   --  15.1*  LATICACIDVEN 2.1* 1.3 1.7  --     Microbiology Recent Results (from the past 240 hour(s))  Culture, blood (Routine x 2)     Status: None (Preliminary result)   Collection Time: 06/22/18  2:20 AM  Result Value Ref Range Status   Specimen Description BLOOD  LEFT HAND  Final   Special Requests   Final    BOTTLES DRAWN AEROBIC AND ANAEROBIC Blood Culture results may not be optimal due to an inadequate volume of blood received in culture bottles   Culture   Final    NO GROWTH 2 DAYS Performed at [facility], [place] [address], [place], [place] [address]    Report Status PENDING  Incomplete  Culture, blood (Routine x 2)     Status: None (Preliminary result)   Collection Time: 06/22/18  2:50 AM  Result Value Ref Range Status   Specimen Description BLOOD RIGHT HAND  Final   Special Requests   Final    BOTTLES DRAWN AEROBIC AND ANAEROBIC Blood Culture adequate volume   Culture   Final    NO GROWTH 2 DAYS Performed at [facility], [place] [address] [place], [place] [address]    Report Status PENDING  Incomplete  SARS Coronavirus 2 (CEPHEID - Performed in [place] hospital lab), Hosp Order     Status: None   Collection Time: 06/22/18  4:35 AM  Result Value Ref Range Status   SARS Coronavirus 2 NEGATIVE NEGATIVE Final    Comment: (NOTE) If result is NEGATIVE SARS-CoV-2 target nucleic acids are NOT DETECTED. The SARS-CoV-2 RNA is generally detectable in upper and lower  respiratory specimens during the acute phase of infection. The lowest  concentration of SARS-CoV-2 viral copies this assay can detect is 250  copies / mL. A negative result does not preclude SARS-CoV-2 infection  and should not be used as the sole basis for treatment or other  patient management decisions.  A negative result may occur with  improper specimen collection / handling, submission of specimen other  than nasopharyngeal swab, presence of viral mutation(s) within the  areas targeted by this assay, and inadequate number of viral copies  (<250 copies / mL). A negative result must be combined with clinical  observations, patient history, and epidemiological information. If result is POSITIVE SARS-CoV-2 target nucleic acids are DETECTED. The SARS-CoV-2 RNA is  generally detectable in upper and lower  respiratory specimens dur ing the acute phase of infection.  Positive  results are indicative of active infection with SARS-CoV-2.  Clinical  correlation with patient history and other diagnostic information is  necessary to determine patient infection status.  Positive results do  not rule out bacterial infection or co-infection with other viruses. If result is PRESUMPTIVE POSTIVE SARS-CoV-2 nucleic acids MAY BE PRESENT.   A presumptive positive result was obtained on the submitted specimen  and confirmed on repeat testing.  While 2019 novel coronavirus  (SARS-CoV-2) nucleic acids may be present in the submitted sample  additional confirmatory testing may be necessary for  epidemiological  and / or clinical management purposes  to differentiate between  SARS-CoV-2 and other Sarbecovirus currently known to infect humans.  If clinically indicated additional testing with an alternate test  methodology [id number]) is advised. The SARS-CoV-2 RNA is generally  detectable in upper and lower respiratory sp ecimens during the acute  phase of infection. The expected result is Negative. Fact Sheet for Patients:  StrictlyIdeas.no Fact Sheet for Healthcare Providers: BankingDealers.co.za This test is not yet approved or cleared by the [place] FDA and has been authorized for detection and/or diagnosis of SARS-CoV-2 by FDA under an Emergency Use Authorization (EUA).  This EUA will remain in effect (meaning this test can be used) for the duration of the COVID-19 declaration under Section 564(b)(1) of the Act, 21 U.S.C. section 360bbb-3(b)(1), unless the authorization is terminated or revoked sooner. Performed at [facility], [place] [address] [place], [place] [address]   Urine culture     Status: Abnormal (Preliminary result)   Collection Time: 06/22/18  5:42 AM  Result Value Ref Range Status    Specimen Description URINE, SUPRAPUBIC  Final   Special Requests NONE  Final   Culture (A)  Final    >=100,000 COLONIES/mL ENTEROCOCCUS FAECALIS >=100,000 COLONIES/mL METHICILLIN RESISTANT STAPHYLOCOCCUS AUREUS 50,000 COLONIES/mL GRAM NEGATIVE RODS IDENTIFICATION AND SUSCEPTIBILITIES TO FOLLOW Performed at [facility], [place] [address] [place], [place] [address]    Report Status PENDING  Incomplete   Organism ID, Bacteria METHICILLIN RESISTANT STAPHYLOCOCCUS AUREUS (A)  Final      Susceptibility   Methicillin resistant staphylococcus aureus - MIC*    CIPROFLOXACIN >=8 RESISTANT Resistant     GENTAMICIN <=0.5 SENSITIVE Sensitive     NITROFURANTOIN <=16 SENSITIVE Sensitive     OXACILLIN >=4 RESISTANT Resistant     TETRACYCLINE <=1 SENSITIVE Sensitive     VANCOMYCIN 1 SENSITIVE Sensitive     TRIMETH/SULFA >=320 RESISTANT Resistant     CLINDAMYCIN <=0.25 SENSITIVE Sensitive     RIFAMPIN <=0.5 SENSITIVE Sensitive     Inducible Clindamycin NEGATIVE Sensitive     * >=100,000 COLONIES/mL METHICILLIN RESISTANT STAPHYLOCOCCUS AUREUS  Expectorated sputum assessment w rflx to resp cult     Status: None   Collection Time: 06/22/18  8:48 AM  Result Value Ref Range Status   Specimen Description TRACHEAL ASPIRATE  Final   Special Requests NONE  Final   Sputum evaluation   Final    THIS SPECIMEN IS ACCEPTABLE FOR SPUTUM CULTURE Performed at [facility], [place] [address] [place], [place] [address]    Report Status 06/22/2018 FINAL  Final  Culture, respiratory     Status: None (Preliminary result)   Collection Time: 06/22/18  8:48 AM  Result Value Ref Range Status   Specimen Description TRACHEAL ASPIRATE  Final   Special Requests NONE Reflexed from [id number]  Final   Gram Stain   Final    ABUNDANT WBC PRESENT,BOTH PMN AND MONONUCLEAR FEW GRAM POSITIVE COCCI FEW GRAM NEGATIVE RODS RARE GRAM VARIABLE ROD    Culture   Final    FEW STAPHYLOCOCCUS AUREUS SUSCEPTIBILITIES TO FOLLOW FEW  GROUP B STREP(S.AGALACTIAE)ISOLATED TESTING AGAINST S. AGALACTIAE NOT ROUTINELY PERFORMED DUE TO PREDICTABILITY OF AMP/PEN/VAN SUSCEPTIBILITY. Performed at [facility], [place] [address] [place], [place] [address]    Report Status PENDING  Incomplete  MRSA PCR Screening     Status: Abnormal   Collection Time: 06/22/18  1:13 PM  Result Value Ref Range Status  MRSA by PCR POSITIVE (A) NEGATIVE Final    Comment:        The GeneXpert MRSA Assay (FDA approved for NASAL specimens only), is one component of a comprehensive MRSA colonization surveillance program. It is not intended to diagnose MRSA infection nor to guide or monitor treatment for MRSA infections. RESULT CALLED TO, READ BACK BY AND VERIFIED WITH: Shon Baton RN 15:15 06/22/18 ([email or web address]) Performed at [facility], [place] [address], [place], [place] [address]     Procedures and diagnostic studies:  No results found.  Medications:   . buPROPion  150 mg Oral Daily  . chlorhexidine  15 mL Mouth Rinse BID  . Chlorhexidine Gluconate Cloth  6 each Topical Q0600  . DULoxetine  30 mg Oral BID  . enoxaparin (LOVENOX) injection  40 mg Subcutaneous QHS  . famotidine  20 mg Per Tube QHS  . feeding supplement (GLUCERNA 1.2 CAL)  1,000 mL Per Tube Q24H  . feeding supplement (PRO-STAT SUGAR FREE 64)  30 mL Oral TID  . ferrous sulfate  220 mg Per Tube Q breakfast  . gabapentin  300 mg Oral TID  . insulin aspart  0-15 Units Subcutaneous Q4H  . insulin glargine  15 Units Subcutaneous QHS  . LORazepam  0.5 mg Oral Q1400  . mouth rinse  15 mL Mouth Rinse q12n4p  . metoCLOPramide  5 mg Oral QID  . midodrine  10 mg Oral TID  . mupirocin ointment  1 application Nasal BID  . polyethylene glycol  17 g Oral Daily  . sodium chloride flush  3 mL Intravenous Once  . sodium chloride flush  3 mL Intravenous Q12H  . umeclidinium bromide  1 puff Inhalation Daily  . zolpidem  5 mg Oral QHS   Continuous Infusions: . sodium chloride 1,000  mL (06/22/18 1726)  . ceFEPime (MAXIPIME) IV 1 g (06/25/18 0602)  . vancomycin 1,000 mg (06/25/18 0641)     LOS: 3 days   Jacquelynn Cree  [facility] Pager [phone]. If unable to reach me by pager, please call my cell phone at [phone].  *Please refer to [email or web address], password [email or web address] to get updated schedule on who will round on this patient, as hospitalists switch teams weekly. If 7PM-7AM, please contact night-coverage at [email or web address], password [email or web address] for any overnight needs.  06/25/2018, 8:18 AM

## 2018-06-25 NOTE — Progress Notes (Addendum)
Pharmacy Antibiotic Note  Angela Page is a 48 y.o. female admitted on 06/22/2018 with aphasia.  Pharmacy has been consulted for vancomycin dosing for sepsis (day 4 of vancomycin therapy). MRSA in trach and urine cx (urine cx also with E. faecalis, low colony ct of Stenotrophomonas maltophilia). Pt is quadriplegic with chronic trach and PEG.  SCr 0.51, CrCL 63 ml/min (stable, but likely an overestimation), afebrile, WBC (06/23/18) 15.1  Plan: Continue vancomycin 1 g IV Q24H for AUC 532 using SCr rounded to 0.8 Continue on cefepime to 1 g IV Q8H, given low body weight Monitor renal fxn, clinical progress Since pt is quadriplegic (with renal function estimation more challenging), will check vancomycin levels to determine AUC with next dose on 6/6 AM   Height: 5\' 2"  (045.9 cm) Weight: 101 lb 6.6 oz (46 kg) IBW/kg (Calculated) : 50.1  Temp (24hrs), Avg:98 F (36.7 C), Min:97.6 F (36.4 C), Max:98.2 F (36.8 C)  Recent Labs  Lab 06/22/18 0219 06/22/18 0937 06/22/18 1416 06/23/18 0442 06/25/18 0638  WBC 17.1*  --   --  15.1*  --   CREATININE 0.73  --   --  0.61 0.51  LATICACIDVEN 2.1* 1.3 1.7  --   --     Estimated Creatinine Clearance: 63.1 mL/min (by C-G formula based on SCr of 0.51 mg/dL).    Allergies  Allergen Reactions  . Penicillins Hives    Has patient had a PCN reaction causing immediate rash, facial/tongue/throat swelling, SOB or lightheadedness with hypotension: Yes Has patient had a PCN reaction causing severe rash involving mucus membranes or skin necrosis: Yes Has patient had a PCN reaction that required hospitalization No Has patient had a PCN reaction occurring within the last 10 years: No-childhood allergy If all of the above answers are "NO", then may proceed with Cephalosporin  Tolerated cephalosporins in past  . Erythromycin Nausea And Vomiting  . Nitrofurantoin Monohyd Macro Nausea And Vomiting  . Oxybutynin     Dries mouth out     Vanc 6/2  >> Cefepime 6/2 >>  6/2 COVID: negative 6/2 UCx : >100,000 colonies/ml E faecalis, MRSA; >50,000 colonies/mL Stenotrophomonas maltophilia  6/2 BCx  X 2: NGTD 6/3 Trach aspirate: MRSA 6/3 MRSA PCR: positive-   Gillermina Hu, PharmD, BCPS, [facility] Clinical Pharmacist 06/25/2018, 12:50 PM

## 2018-06-25 NOTE — Progress Notes (Signed)
RT NOTE: RT titrated FIO2 down to 28% ATC as that is what patient is usually on at facility she lives at and patient has been at 100% on 40%.  Patient's sats are holding steady at 99-100%. Vitals are stable. Patient is resting comfortably. RT will continue to monitor.

## 2018-06-26 LAB — GLUCOSE, CAPILLARY
Glucose-Capillary: 125 mg/dL — ABNORMAL HIGH (ref 70–99)
Glucose-Capillary: 144 mg/dL — ABNORMAL HIGH (ref 70–99)
Glucose-Capillary: 152 mg/dL — ABNORMAL HIGH (ref 70–99)
Glucose-Capillary: 171 mg/dL — ABNORMAL HIGH (ref 70–99)
Glucose-Capillary: 173 mg/dL — ABNORMAL HIGH (ref 70–99)
Glucose-Capillary: 198 mg/dL — ABNORMAL HIGH (ref 70–99)

## 2018-06-26 LAB — VANCOMYCIN, TROUGH: Vancomycin Tr: 12 ug/mL — ABNORMAL LOW (ref 15–20)

## 2018-06-26 LAB — URINE CULTURE: Culture: >=100000 — AB

## 2018-06-26 LAB — CBC
HCT: 31.2 % — ABNORMAL LOW (ref 36.0–46.0)
Hemoglobin: 9.6 g/dL — ABNORMAL LOW (ref 12.0–15.0)
MCH: 28.7 pg (ref 26.0–34.0)
MCHC: 30.8 g/dL (ref 30.0–36.0)
MCV: 93.1 fL (ref 80.0–100.0)
Platelets: 394 10*3/uL (ref 150–400)
RBC: 3.35 MIL/uL — ABNORMAL LOW (ref 3.87–5.11)
RDW: 16.8 % — ABNORMAL HIGH (ref 11.5–15.5)
WBC: 9.9 10*3/uL (ref 4.0–10.5)
nRBC: 0 % (ref 0.0–0.2)

## 2018-06-26 LAB — NOVEL CORONAVIRUS, NAA (HOSP ORDER, SEND-OUT TO REF LAB; TAT 18-24 HRS): SARS-CoV-2, NAA: NOT DETECTED

## 2018-06-26 LAB — VANCOMYCIN, PEAK: Vancomycin Pk: 50 ug/mL — ABNORMAL HIGH (ref 30–40)

## 2018-06-26 MED ORDER — VANCOMYCIN HCL IN DEXTROSE 750-5 MG/150ML-% IV SOLN
750.0000 mg | INTRAVENOUS | Status: DC
  Administered 2018-06-27: 750 mg via INTRAVENOUS
  Filled 2018-06-26: qty 150

## 2018-06-26 MED ORDER — LIP MEDEX EX OINT
TOPICAL_OINTMENT | CUTANEOUS | Status: DC | PRN
  Filled 2018-06-26 (×2): qty 7

## 2018-06-26 MED ORDER — CARBAMIDE PEROXIDE 6.5 % OT SOLN
5.0000 [drp] | Freq: Two times a day (BID) | OTIC | Status: DC
  Administered 2018-06-26 – 2018-06-27 (×3): 5 [drp] via OTIC
  Filled 2018-06-26: qty 15

## 2018-06-26 MED ORDER — LINEZOLID 600 MG PO TABS: 600.0000 mg | ORAL_TABLET | Freq: Two times a day (BID) | ORAL | 0 refills | Status: DC

## 2018-06-26 NOTE — Progress Notes (Signed)
Pharmacy Antibiotic Note  Angela Page is a 48 y.o. female admitted on 06/22/2018 with sepsis 2/2 UTI. Pharmacy has been consulted for vancomycin dosing. Patient is quadriplegic with chronic trach and PEG.  Vancomycin peak: 50 mcg/mL Vancomycin trough: 12 mcg/mL Calculated AUC: 651  Plan: Adjust Vancomycin dose to 750mg  IV q24h for estimated AUC 488 (goal 400-550).   Would consider d/c'ing Cefepime.  Plan for discharge tomorrow per d/w RN. Clarify antibiotic plan prior to d/c. ID recommending Zyvox per notes, but DDI with Cymbalta and Wellbutrin.    Height: 5\' 2"  (157.5 cm) Weight: 101 lb 6.6 oz (46 kg) IBW/kg (Calculated) : 50.1  Temp (24hrs), Avg:98.1 F (36.7 C), Min:97.8 F (36.6 C), Max:98.7 F (37.1 C)  Recent Labs  Lab 06/22/18 0219 06/22/18 0937 06/22/18 1416 06/23/18 0442 06/25/18 0638 06/26/18 0519 06/26/18 0735  WBC 17.1*  --   --  15.1*  --  9.9  --   CREATININE 0.73  --   --  0.61 0.51  --   --   LATICACIDVEN 2.1* 1.3 1.7  --   --   --   --   VANCOTROUGH  --   --   --   --   --  12*  --   VANCOPEAK  --   --   --   --   --   --  50*    Estimated Creatinine Clearance: 63.1 mL/min (by C-G formula based on SCr of 0.51 mg/dL).    Allergies  Allergen Reactions  . Penicillins Hives    Has patient had a PCN reaction causing immediate rash, facial/tongue/throat swelling, SOB or lightheadedness with hypotension: Yes Has patient had a PCN reaction causing severe rash involving mucus membranes or skin necrosis: Yes Has patient had a PCN reaction that required hospitalization No Has patient had a PCN reaction occurring within the last 10 years: No-childhood allergy If all of the above answers are "NO", then may proceed with Cephalosporin  Tolerated cephalosporins in past  . Erythromycin Nausea And Vomiting  . Nitrofurantoin Monohyd Macro Nausea And Vomiting  . Oxybutynin     Dries mouth out     Vanc 6/2 >> Cefepime 6/2 >>  6/2 COVID: negative 6/2 UCx  : >100,000 colonies/ml E faecalis, MRSA; 50,000 colonies/mL Stenotrophomonas maltophilia  6/2 BCx: NGTD 6/2 MRSA PCR: positive 6/3 Trach aspirate: MRSA 6/5 COVID: negative   Lindell Spar, PharmD, BCPS Clinical Pharmacist 06/26/2018, 7:40 PM

## 2018-06-26 NOTE — Discharge Summary (Addendum)
Physician Discharge Summary  Angela Page FMB:[id number] DOB: 08-07-1970 DOA: 06/22/2018  PCP: Default, Provider, MD  Admit date: 06/22/2018 Discharge date 06/27/2018 Admitted From: Nursing home  disposition: Nursing home Recommendations for Outpatient Follow-up:  1. Follow up with PCP in 1-2 weeks 2. Please obtain BMP/CBC in one week   Home Health: None Equipment/Devices: None Discharge Condition: Stable improved CODE STATUS full code Diet recommendation: Tube feeds and pured diet  Brief/Interim Summary: 48 y.o. female with a PMH of traumatic cervical spine fracture resulting in quadriplegia, status post trach and PEG, suprapubic catheter, COPD with chronic hypoxic respiratory failure, chronic hypotension, IDDM, chronic pain, sick sinus syndrome status post pacemaker, chronic normocytic anemia and chronic decubitus ulcers who was admitted from her skilled nursing facility on 06/22/2018 for evaluation of AMS.  She has a longstanding history of multiple hospitalizations for treatment of infections with healthcare associated organisms.  Upon initial evaluation in the ED, her chest x-ray was clear but urinalysis showed a large amount of leukocytes.  She was given a liter of fluids and started on empiric antibiotics with cefepime, metronidazole and vancomycin  Discharge Diagnoses:  Principal Problem:   Sepsis secondary to UTI [facility]) Active Problems:   Protein-calorie malnutrition, severe ([place])   Depression   Quadriplegia, C5-C7 incomplete ([place])   Leukocytosis   Hypokalemia   Decubitus ulcer of sacral region, stage 4 (HCC)   Anemia, iron deficiency   Acute metabolic encephalopathy   Normocytic normochromic anemia   Asthma   Esophageal dysphagia   Tracheostomy dependence (HCC)   Chronic obstructive pulmonary disease (HCC)   Autonomic neuropathy   Presence of permanent cardiac pacemaker   Chronically on opiate therapy   PEG (percutaneous endoscopic gastrostomy) status (HCC)    Hypoalbuminemia   Paraplegia following spinal cord injury ([place])   Recurrent UTI   Chronic pain due to injury   Complicated UTI (urinary tract infection)   Lactic acid acidosis   Suprapubic catheter (HCC)   Acute on chronic respiratory failure (HCC)   Thrombocytosis (HCC)   Hypercalcemia  Sepsis secondary to complicated UTI/suprapubic catheter ([place]) with leukocytosis and lactic acidosis, present on admission Initially presented with tachycardia, tachypnea, leukocytosis and lactic acidosis concerning for sepsis from a urinary source.  Chest x-ray clear.  Given history of healthcare acquired organisms, she was broadly covered with cefepime, metronidazole and vancomycin after receiving fluid volume resuscitation.  You urine culture grew MRSA and enterococcus . COVID-19 testing was negative.  Suprapubic catheter was changed 6/3/ 7017   Acute metabolic encephalopathy secondary to sepsis resolved Active Problems:   Asthma/COPD with acute on chronic respiratory failure status post tracheostomy Uses 3 L of supplemental oxygen at baseline.  On admission, she was requiring up to 10 L oxygen saturations currently stable.  Continue respiratory care for tracheostomy management and albuterol nebulizers as needed for shortness of breath/wheezing at the facility. Continue Spiriva and Ellipta.  Back to baseline oxygen requirement.    Hypercalcemia continue to make sure she keeps hydrated at the nursing facility.    Type 2 diabetes, uncontrolled, with long-term insulin use Patient had marked elevation of blood glucoses on admission and most recent hemoglobin A1c was 9.4% indicating suboptimal control.  Continue insulin.     Metabolic acidosis with elevated anion gap Anion gap 18 on admission with a bicarb of 28.  Possibly from lactic acidosis though the patient did have marked elevation of blood glucoses and could have been an early DKA.  Anion gap improved to 11 upon discharge  with IV hydration.      Hypokalemia Repleted.    Depression/anxiety Continue Wellbutrin and Ativan.    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  Continue gabapentin and Cymbalta.  Blood pressure chronically low.  Continue midodrine and baclofen for muscle spasms. Reduce pressure with mattress overlay.    Decubitus ulcer of sacral region, stage 4 [facility]) present on admission Wound care per wound care nurse.    Normocytic normochromic anemia Stable.  Continue ferrous sulfate.    Prolonged QT interval Given magnesium sulfate.     Thrombocytosis ([place]) Likely an acute phase reactant to infection.  Platelet count 510 on admission came down to 394 on discharge.    Chronic dysphasia status post PEG Patient reports that she eats pured foods at baseline.  Evaluated by speech therapist and diet advanced to dysphagia 1.    MRSA carrier  On contact precautions.    Severe protein calorie malnutrition/hypoalbuminemia Body mass index is 18.55 kg/m.  Dietitian following.  Continue tube feeds and dysphagia 1 diet.   Pressure Injury Unstageable - Full thickness tissue loss in which the base of the ulcer is covered by slough (yellow, tan, gray, green or brown) and/or eschar (tan, brown or black) in the wound bed. healed old wound (Active)     Location: Sacrum  Location Orientation: Mid  Staging: Unstageable - Full thickness tissue loss in which the base of the ulcer is covered by slough (yellow, tan, gray, green or brown) and/or eschar (tan, brown or black) in the wound bed.  Wound Description (Comments): healed old wound  Present on Admission: Yes     Pressure Injury Unstageable - Full thickness tissue loss in which the base of the ulcer is covered by slough (yellow, tan, gray, green or brown) and/or eschar (tan, brown or black) in the wound bed. old wound (Active)     Location: Thigh  Location Orientation: Posterior;Proximal;Right   Staging: Unstageable - Full thickness tissue loss in which the base of the ulcer is covered by slough (yellow, tan, gray, green or brown) and/or eschar (tan, brown or black) in the wound bed.  Wound Description (Comments): old wound  Present on Admission: Yes     Pressure Injury 12/20/17 Stage II -  Partial thickness loss of dermis presenting as a shallow open ulcer with a red, pink wound bed without slough. (Active)  12/20/17 0800  Location: Sacrum  Location Orientation: Right;Left  Staging: Stage II -  Partial thickness loss of dermis presenting as a shallow open ulcer with a red, pink wound bed without slough.  Wound Description (Comments):   Present on Admission:      Pressure Injury 12/20/17 Stage II -  Partial thickness loss of dermis presenting as a shallow open ulcer with a red, pink wound bed without slough. (Active)  12/20/17 0800  Location: Anus  Location Orientation: Right;Left  Staging: Stage II -  Partial thickness loss of dermis presenting as a shallow open ulcer with a red, pink wound bed without slough.  Wound Description (Comments):   Present on Admission:      Pressure Injury 12/20/17 Stage II -  Partial thickness loss of dermis presenting as a shallow open ulcer with a red, pink wound bed without slough. (Active)  12/20/17 0800  Location: Ischial tuberosity  Location Orientation: Right  Staging: Stage II -  Partial thickness loss of dermis presenting as a shallow open ulcer with a red, pink wound bed without slough.  Wound Description (  Comments):   Present on Admission:      Pressure Injury 02/14/18 Unstageable - Full thickness tissue loss in which the base of the ulcer is covered by slough (yellow, tan, gray, green or brown) and/or eschar (tan, brown or black) in the wound bed. 5x7x0.25 (Active)  02/14/18 1355  Location: Sacrum  Location Orientation: Medial  Staging: Unstageable - Full thickness tissue loss in which the base of the ulcer is covered by slough  (yellow, tan, gray, green or brown) and/or eschar (tan, brown or black) in the wound bed.  Wound Description (Comments): 5x7x0.25  Present on Admission: Yes     Pressure Injury 02/14/18 Stage IV - Full thickness tissue loss with exposed bone, tendon or muscle. 5x3x10 (Active)  02/14/18 1356  Location: Ischial tuberosity  Location Orientation: Right  Staging: Stage IV - Full thickness tissue loss with exposed bone, tendon or muscle.  Wound Description (Comments): 5x3x10  Present on Admission: Yes     Pressure Injury 02/14/18 Stage IV - Full thickness tissue loss with exposed bone, tendon or muscle. 4x2x0.5 (Active)  02/14/18 1357  Location: Ischial tuberosity  Location Orientation: Left  Staging: Stage IV - Full thickness tissue loss with exposed bone, tendon or muscle.  Wound Description (Comments): 4x2x0.5  Present on Admission: Yes     Pressure Injury 04/05/18 Stage III -  Full thickness tissue loss. Subcutaneous fat may be visible but bone, tendon or muscle are NOT exposed. (Active)  04/05/18 1200  Location: Ischial tuberosity  Location Orientation: Right  Staging: Stage III -  Full thickness tissue loss. Subcutaneous fat may be visible but bone, tendon or muscle are NOT exposed.  Wound Description (Comments):   Present on Admission: Yes     Pressure Injury 04/05/18 Stage III -  Full thickness tissue loss. Subcutaneous fat may be visible but bone, tendon or muscle are NOT exposed. (Active)  04/05/18 1200  Location: Ischial tuberosity  Location Orientation: Left  Staging: Stage III -  Full thickness tissue loss. Subcutaneous fat may be visible but bone, tendon or muscle are NOT exposed.  Wound Description (Comments):   Present on Admission: Yes     Pressure Injury 04/05/18 Stage III -  Full thickness tissue loss. Subcutaneous fat may be visible but bone, tendon or muscle are NOT exposed. (Active)  04/05/18 1200  Location: Lumbar  Location Orientation: Medial  Staging: Stage  III -  Full thickness tissue loss. Subcutaneous fat may be visible but bone, tendon or muscle are NOT exposed.  Wound Description (Comments):   Present on Admission: Yes     Pressure Injury 04/05/18 Stage I -  Intact skin with non-blanchable redness of a localized area usually over a bony prominence. (Active)  04/05/18 1200  Location: Knee  Location Orientation: Right  Staging: Stage I -  Intact skin with non-blanchable redness of a localized area usually over a bony prominence.  Wound Description (Comments):   Present on Admission: Yes     Pressure Injury 04/05/18 Stage I -  Intact skin with non-blanchable redness of a localized area usually over a bony prominence. (Active)  04/05/18 1200  Location: Knee  Location Orientation: Left  Staging: Stage I -  Intact skin with non-blanchable redness of a localized area usually over a bony prominence.  Wound Description (Comments):   Present on Admission: Yes     Pressure Injury 06/22/18 Sacrum Mid Stage IV - Full thickness tissue loss with exposed bone, tendon or muscle. upper and lower sacrum with 2  pressure injuries (Active)  06/22/18   Location: Sacrum  Location Orientation: Mid  Staging: Stage IV - Full thickness tissue loss with exposed bone, tendon or muscle.  Wound Description (Comments): upper and lower sacrum with 2 pressure injuries  Present on Admission: Yes     Pressure Injury 06/22/18 Ischial tuberosity Left Stage IV - Full thickness tissue loss with exposed bone, tendon or muscle. (Active)  06/22/18   Location: Ischial tuberosity  Location Orientation: Left  Staging: Stage IV - Full thickness tissue loss with exposed bone, tendon or muscle.  Wound Description (Comments):   Present on Admission: Yes     Pressure Injury 06/22/18 Ischial tuberosity Right Stage IV - Full thickness tissue loss with exposed bone, tendon or muscle. (Active)  06/22/18   Location: Ischial tuberosity  Location Orientation: Right  Staging: Stage  IV - Full thickness tissue loss with exposed bone, tendon or muscle.  Wound Description (Comments):   Present on Admission: Yes      Nutrition Problem: Increased nutrient needs Etiology: wound healing    Signs/Symptoms: estimated needs     Interventions: Tube feeding, Prostat  Estimated body mass index is 18.55 kg/m as calculated from the following:   Height as of this encounter: 5\' 2"  (1.575 m).   Weight as of this encounter: 46 kg.  Discharge Instructions  Discharge Instructions    Call MD for:  difficulty breathing, headache or visual disturbances   Complete by:  As directed    Call MD for:  persistant nausea and vomiting   Complete by:  As directed    Call MD for:  temperature >100.4   Complete by:  As directed    Diet - low sodium heart healthy   Complete by:  As directed    Increase activity slowly   Complete by:  As directed      Allergies as of 06/26/2018      Reactions   Penicillins Hives   Has patient had a PCN reaction causing immediate rash, facial/tongue/throat swelling, SOB or lightheadedness with hypotension: Yes Has patient had a PCN reaction causing severe rash involving mucus membranes or skin necrosis: Yes Has patient had a PCN reaction that required hospitalization No Has patient had a PCN reaction occurring within the last 10 years: No-childhood allergy If all of the above answers are "NO", then may proceed with Cephalosporin  Tolerated cephalosporins in past   Erythromycin Nausea And Vomiting   Nitrofurantoin Monohyd Macro Nausea And Vomiting   Oxybutynin    Dries mouth out       Medication List    STOP taking these medications   baclofen 20 MG tablet Commonly known as:  LIORESAL   feeding supplement (PRO-STAT SUGAR FREE 64) Liqd   furosemide 40 MG tablet Commonly known as:  LASIX   midodrine 10 MG tablet Commonly known as:  PROAMATINE   oxyCODONE 5 MG immediate release tablet Commonly known as:  Oxy IR/ROXICODONE   temazepam  7.5 MG capsule Commonly known as:  RESTORIL     TAKE these medications   Basaglar KwikPen 100 UNIT/ML Sopn Inject 0.05 mLs (5 Units total) into the skin at bedtime.   buPROPion 150 MG 12 hr tablet Commonly known as:  WELLBUTRIN SR Take 150 mg by mouth daily.   DULoxetine 30 MG capsule Commonly known as:  CYMBALTA Take 1 capsule (30 mg total) by mouth 2 (two) times daily.   famotidine 40 MG/5ML suspension Commonly known as:  PEPCID Place 2.5 mLs (20  mg total) into feeding tube at bedtime.   ProMod Liqd Take 30 mLs by mouth 3 (three) times daily. What changed:  Another medication with the same name was changed. Make sure you understand how and when to take each.   feeding supplement (GLUCERNA 1.2 CAL) Liqd Place 1,000 mLs into feeding tube daily. What changed:    when to take this  additional instructions   ferrous sulfate 300 (60 Fe) MG/5ML syrup Place 3.7 mLs (220 mg total) into feeding tube daily with breakfast.   gabapentin 300 MG capsule Commonly known as:  NEURONTIN Take 300 mg by mouth 3 (three) times daily. What changed:  Another medication with the same name was removed. Continue taking this medication, and follow the directions you see here.   heparin 5000 UNIT/ML injection Inject 5,000 Units into the skin every 8 (eight) hours.   insulin aspart 100 UNIT/ML injection Commonly known as:  novoLOG Inject 10 Units into the skin every 4 (four) hours. For glucose 150 to 200 use 1 units, for glucose 201-250 use 2 units, for 251 to 300 use 4 units, for 301 to 350 use 6 units, for 351 to 400 use 8 units, for 401 or greater use 10 units. What changed:    how much to take  when to take this  additional instructions   linezolid 600 MG tablet Commonly known as:  Zyvox Take 1 tablet (600 mg total) by mouth 2 (two) times daily.   LORazepam 0.5 MG tablet Commonly known as:  Ativan Take 1 tablet (0.5 mg total) by mouth every 8 (eight) hours as needed for  anxiety. What changed:  when to take this   metoCLOPramide 5 MG tablet Commonly known as:  REGLAN Take 5 mg by mouth 4 (four) times daily.   OXYGEN Inhale 3 L into the lungs continuous.   polyethylene glycol 17 g packet Commonly known as:  MIRALAX / GLYCOLAX Take 17 g by mouth daily. Hold if diarrhea.   tiotropium 18 MCG inhalation capsule Commonly known as:  SPIRIVA Place 18 mcg into inhaler and inhale daily.   Trulicity 1.5 HU/3.1SH Sopn Generic drug:  Dulaglutide Inject 1.5 mg into the skin every Friday.   zolpidem 5 MG tablet Commonly known as:  AMBIEN Take 2.5 mg by mouth at bedtime.       Allergies  Allergen Reactions  . Penicillins Hives    Has patient had a PCN reaction causing immediate rash, facial/tongue/throat swelling, SOB or lightheadedness with hypotension: Yes Has patient had a PCN reaction causing severe rash involving mucus membranes or skin necrosis: Yes Has patient had a PCN reaction that required hospitalization No Has patient had a PCN reaction occurring within the last 10 years: No-childhood allergy If all of the above answers are "NO", then may proceed with Cephalosporin  Tolerated cephalosporins in past  . Erythromycin Nausea And Vomiting  . Nitrofurantoin Monohyd Macro Nausea And Vomiting  . Oxybutynin     Dries mouth out     Consultations:  Discussed with infectious disease over the phone   Procedures/Studies: Ct Head Wo Contrast  Result Date: 06/22/2018 CLINICAL DATA:  Altered level of consciousness EXAM: CT HEAD WITHOUT CONTRAST TECHNIQUE: Contiguous axial images were obtained from the base of the skull through the vertex without intravenous contrast. COMPARISON:  02/14/2018 FINDINGS: Brain: No evidence of acute infarction, hemorrhage, hydrocephalus, extra-axial collection or mass lesion/mass effect. Mild for age cerebral volume loss Vascular: No hyperdense vessel or unexpected calcification. Skull: Normal. Negative for  fracture or focal  lesion. Sinuses/Orbits: Negative Other: Motion degraded IMPRESSION: Stable, negative head CT. Electronically Signed   By: Monte Fantasia M.D.   On: 06/22/2018 04:17   Dg Chest Port 1 View  Result Date: 06/22/2018 CLINICAL DATA:  Sepsis EXAM: PORTABLE CHEST 1 VIEW COMPARISON:  04/06/2018 FINDINGS: The tracheostomy tube terminates above the carina. The dual chamber left-sided pacemaker is stable in positioning. The heart size is stable. The lungs are clear. There is blunting of the right costophrenic angle which has improved from prior study. There is no pneumothorax. IMPRESSION: No active disease. Electronically Signed   By: Constance Holster M.D.   On: 06/22/2018 03:19    (Echo, Carotid, EGD, Colonoscopy, ERCP)    Subjective: She is resting in bed she is awake alert no complaints anxious to go back  Discharge Exam: Vitals:   06/26/18 0843 06/26/18 1120  BP: (!) 129/94   Pulse: 80 82  Resp: 20 20  Temp: 97.9 F (36.6 C)   SpO2: 100% 100%   Vitals:   06/26/18 0450 06/26/18 0817 06/26/18 0843 06/26/18 1120  BP:   (!) 129/94   Pulse: 80 85 80 82  Resp: 16 20 20 20   Temp:   97.9 F (36.6 C)   TempSrc:   Oral   SpO2: 100% 100% 100% 100%  Weight:      Height:        General: Pt is alert, awake, not in acute distress Cardiovascular: RRR, S1/S2 +, no rubs, no gallops Respiratory: CTA bilaterally, no wheezing, no rhonchi Abdominal: Soft, NT, ND, bowel sounds + suprapubic catheter and PEG tube in place Extremities: no edema, no cyanosis    The results of significant diagnostics from this hospitalization (including imaging, microbiology, ancillary and laboratory) are listed below for reference.     Microbiology: Recent Results (from the past 240 hour(s))  Culture, blood (Routine x 2)     Status: None (Preliminary result)   Collection Time: 06/22/18  2:20 AM  Result Value Ref Range Status   Specimen Description BLOOD LEFT HAND  Final   Special Requests   Final    BOTTLES  DRAWN AEROBIC AND ANAEROBIC Blood Culture results may not be optimal due to an inadequate volume of blood received in culture bottles   Culture   Final    NO GROWTH 4 DAYS Performed at [facility], [place] [address], [place], [place] [address]    Report Status PENDING  Incomplete  Culture, blood (Routine x 2)     Status: None (Preliminary result)   Collection Time: 06/22/18  2:50 AM  Result Value Ref Range Status   Specimen Description BLOOD RIGHT HAND  Final   Special Requests   Final    BOTTLES DRAWN AEROBIC AND ANAEROBIC Blood Culture adequate volume   Culture   Final    NO GROWTH 4 DAYS Performed at [facility], [place] [address], [place], [place] [address]    Report Status PENDING  Incomplete  SARS Coronavirus 2 (CEPHEID - Performed in [place] hospital lab), Hosp Order     Status: None   Collection Time: 06/22/18  4:35 AM  Result Value Ref Range Status   SARS Coronavirus 2 NEGATIVE NEGATIVE Final    Comment: (NOTE) If result is NEGATIVE SARS-CoV-2 target nucleic acids are NOT DETECTED. The SARS-CoV-2 RNA is generally detectable in upper and lower  respiratory specimens during the acute phase of infection. The lowest  concentration of SARS-CoV-2 viral copies  this assay can detect is 250  copies / mL. A negative result does not preclude SARS-CoV-2 infection  and should not be used as the sole basis for treatment or other  patient management decisions.  A negative result may occur with  improper specimen collection / handling, submission of specimen other  than nasopharyngeal swab, presence of viral mutation(s) within the  areas targeted by this assay, and inadequate number of viral copies  (<250 copies / mL). A negative result must be combined with clinical  observations, patient history, and epidemiological information. If result is POSITIVE SARS-CoV-2 target nucleic acids are DETECTED. The SARS-CoV-2 RNA is generally detectable in upper and lower  respiratory  specimens dur ing the acute phase of infection.  Positive  results are indicative of active infection with SARS-CoV-2.  Clinical  correlation with patient history and other diagnostic information is  necessary to determine patient infection status.  Positive results do  not rule out bacterial infection or co-infection with other viruses. If result is PRESUMPTIVE POSTIVE SARS-CoV-2 nucleic acids MAY BE PRESENT.   A presumptive positive result was obtained on the submitted specimen  and confirmed on repeat testing.  While 2019 novel coronavirus  (SARS-CoV-2) nucleic acids may be present in the submitted sample  additional confirmatory testing may be necessary for epidemiological  and / or clinical management purposes  to differentiate between  SARS-CoV-2 and other Sarbecovirus currently known to infect humans.  If clinically indicated additional testing with an alternate test  methodology [phone]) is advised. The SARS-CoV-2 RNA is generally  detectable in upper and lower respiratory sp ecimens during the acute  phase of infection. The expected result is Negative. Fact Sheet for Patients:  StrictlyIdeas.no Fact Sheet for Healthcare Providers: BankingDealers.co.za This test is not yet approved or cleared by the [place] FDA and has been authorized for detection and/or diagnosis of SARS-CoV-2 by FDA under an Emergency Use Authorization (EUA).  This EUA will remain in effect (meaning this test can be used) for the duration of the COVID-19 declaration under Section 564(b)(1) of the Act, 21 U.S.C. section 360bbb-3(b)(1), unless the authorization is terminated or revoked sooner. Performed at [facility], [place] [address], [place], [place] [address]   Urine culture     Status: Abnormal   Collection Time: 06/22/18  5:42 AM  Result Value Ref Range Status   Specimen Description URINE, SUPRAPUBIC  Final   Special Requests   Final     NONE Performed at [facility], [place] [address], [place], [place] [address]    Culture (A)  Final    >=100,000 COLONIES/mL ENTEROCOCCUS FAECALIS >=100,000 COLONIES/mL METHICILLIN RESISTANT STAPHYLOCOCCUS AUREUS 50,000 COLONIES/mL STENOTROPHOMONAS MALTOPHILIA    Report Status 06/26/2018 FINAL  Final   Organism ID, Bacteria METHICILLIN RESISTANT STAPHYLOCOCCUS AUREUS (A)  Final   Organism ID, Bacteria ENTEROCOCCUS FAECALIS (A)  Final   Organism ID, Bacteria STENOTROPHOMONAS MALTOPHILIA (A)  Final      Susceptibility   Enterococcus faecalis - MIC*    AMPICILLIN <=2 SENSITIVE Sensitive     LEVOFLOXACIN >=8 RESISTANT Resistant     NITROFURANTOIN <=16 SENSITIVE Sensitive     VANCOMYCIN <=0.5 SENSITIVE Sensitive     * >=100,000 COLONIES/mL ENTEROCOCCUS FAECALIS   Methicillin resistant staphylococcus aureus - MIC*    CIPROFLOXACIN >=8 RESISTANT Resistant     GENTAMICIN <=0.5 SENSITIVE Sensitive     NITROFURANTOIN <=16 SENSITIVE Sensitive     OXACILLIN >=4 RESISTANT Resistant     TETRACYCLINE <=1  SENSITIVE Sensitive     VANCOMYCIN 1 SENSITIVE Sensitive     TRIMETH/SULFA >=320 RESISTANT Resistant     CLINDAMYCIN <=0.25 SENSITIVE Sensitive     RIFAMPIN <=0.5 SENSITIVE Sensitive     Inducible Clindamycin NEGATIVE Sensitive     * >=100,000 COLONIES/mL METHICILLIN RESISTANT STAPHYLOCOCCUS AUREUS   Stenotrophomonas maltophilia - MIC*    LEVOFLOXACIN >=8 RESISTANT Resistant     TRIMETH/SULFA >=320 RESISTANT Resistant     * 50,000 COLONIES/mL STENOTROPHOMONAS MALTOPHILIA  Expectorated sputum assessment w rflx to resp cult     Status: None   Collection Time: 06/22/18  8:48 AM  Result Value Ref Range Status   Specimen Description TRACHEAL ASPIRATE  Final   Special Requests NONE  Final   Sputum evaluation   Final    THIS SPECIMEN IS ACCEPTABLE FOR SPUTUM CULTURE Performed at [facility], [address], [place], [place] [address]    Report Status 06/22/2018 FINAL  Final  Culture,  respiratory     Status: None   Collection Time: 06/22/18  8:48 AM  Result Value Ref Range Status   Specimen Description TRACHEAL ASPIRATE  Final   Special Requests NONE Reflexed from [id number]  Final   Gram Stain   Final    ABUNDANT WBC PRESENT,BOTH PMN AND MONONUCLEAR FEW GRAM POSITIVE COCCI FEW GRAM NEGATIVE RODS RARE GRAM VARIABLE ROD    Culture   Final    FEW METHICILLIN RESISTANT STAPHYLOCOCCUS AUREUS FEW GROUP B STREP(S.AGALACTIAE)ISOLATED TESTING AGAINST S. AGALACTIAE NOT ROUTINELY PERFORMED DUE TO PREDICTABILITY OF AMP/PEN/VAN SUSCEPTIBILITY. Performed at [facility], [place] [address], [place], [place] [address]    Report Status 06/25/2018 FINAL  Final   Organism ID, Bacteria METHICILLIN RESISTANT STAPHYLOCOCCUS AUREUS  Final      Susceptibility   Methicillin resistant staphylococcus aureus - MIC*    CIPROFLOXACIN >=8 RESISTANT Resistant     ERYTHROMYCIN >=8 RESISTANT Resistant     GENTAMICIN <=0.5 SENSITIVE Sensitive     OXACILLIN >=4 RESISTANT Resistant     TETRACYCLINE <=1 SENSITIVE Sensitive     VANCOMYCIN 1 SENSITIVE Sensitive     TRIMETH/SULFA >=320 RESISTANT Resistant     CLINDAMYCIN <=0.25 SENSITIVE Sensitive     RIFAMPIN <=0.5 SENSITIVE Sensitive     Inducible Clindamycin NEGATIVE Sensitive     * FEW METHICILLIN RESISTANT STAPHYLOCOCCUS AUREUS  MRSA PCR Screening     Status: Abnormal   Collection Time: 06/22/18  1:13 PM  Result Value Ref Range Status   MRSA by PCR POSITIVE (A) NEGATIVE Final    Comment:        The GeneXpert MRSA Assay (FDA approved for NASAL specimens only), is one component of a comprehensive MRSA colonization surveillance program. It is not intended to diagnose MRSA infection nor to guide or monitor treatment for MRSA infections. RESULT CALLED TO, READ BACK BY AND VERIFIED WITH: Shon Baton RN 15:15 06/22/18 ([email or web address]) Performed at [facility], [place] [address], [place], [place] [address]   Novel Coronavirus, NAA (hospital order;  send-out to ref lab)     Status: None   Collection Time: 06/25/18  9:23 AM  Result Value Ref Range Status   SARS-CoV-2, NAA NOT DETECTED NOT DETECTED Final    Comment: (NOTE) This test was developed and its performance characteristics determined by [employer]. This test has not been FDA cleared or approved. This test has been authorized by FDA under an Emergency Use Authorization (EUA). This test is only authorized for the  duration of time the declaration that circumstances exist justifying the authorization of the emergency use of in vitro diagnostic tests for detection of SARS-CoV-2 virus and/or diagnosis of COVID-19 infection under section 564(b)(1) of the Act, 21 U.S.C. 401UUV-2(Z)(3), unless the authorization is terminated or revoked sooner. When diagnostic testing is negative, the possibility of a false negative result should be considered in the context of a patient's recent exposures and the presence of clinical signs and symptoms consistent with COVID-19. An individual without symptoms of COVID-19 and who is not shedding SARS-CoV-2 virus would expect to have a negative (not detected) result in this assay. Performed  At: [facility] [address] [place], [place] [id number] Rush Farmer MD QV:[id number]    [place]  Final    Comment: Performed at [facility], [place] [address] [place], [place] [address]     Labs: BNP (last 3 results) Recent Labs    12/16/17 0307 02/01/18 2014  BNP 280.4* 88.4   Basic Metabolic Panel: Recent Labs  Lab 06/22/18 0219 06/23/18 0442 06/25/18 0638  NA 136 143 135  K 3.7 3.4* 4.5  CL 90* 103 101  CO2 28 26 23   GLUCOSE 398* 318* 237*  BUN 37* 35* 31*  CREATININE 0.73 0.61 0.51  CALCIUM 10.7* 9.9 9.7   Liver Function Tests: Recent Labs  Lab 06/22/18 0219 06/23/18 0442  AST 26 16  ALT 40 23  ALKPHOS 265* 184*  BILITOT 0.5 0.5  PROT 9.0* 7.1  ALBUMIN 3.6 3.0*   No results  for input(s): LIPASE, AMYLASE in the last 168 hours. No results for input(s): AMMONIA in the last 168 hours. CBC: Recent Labs  Lab 06/22/18 0219 06/23/18 0442 06/26/18 0519  WBC 17.1* 15.1* 9.9  NEUTROABS 13.9*  --   --   HGB 10.2* 8.5* 9.6*  HCT 32.1* 27.8* 31.2*  MCV 91.7 94.9 93.1  PLT 510* 368 394   Cardiac Enzymes: No results for input(s): CKTOTAL, CKMB, CKMBINDEX, TROPONINI in the last 168 hours. BNP: Invalid input(s): POCBNP CBG: Recent Labs  Lab 06/25/18 2028 06/25/18 2354 06/26/18 0337 06/26/18 0839 06/26/18 1210  GLUCAP 189* 174* 144* 198* 152*   D-Dimer No results for input(s): DDIMER in the last 72 hours. Hgb A1c No results for input(s): HGBA1C in the last 72 hours. Lipid Profile No results for input(s): CHOL, HDL, LDLCALC, TRIG, CHOLHDL, LDLDIRECT in the last 72 hours. Thyroid function studies No results for input(s): TSH, T4TOTAL, T3FREE, THYROIDAB in the last 72 hours.  Invalid input(s): FREET3 Anemia work up No results for input(s): VITAMINB12, FOLATE, FERRITIN, TIBC, IRON, RETICCTPCT in the last 72 hours. Urinalysis    Component Value Date/Time   COLORURINE YELLOW 06/22/2018 0454   APPEARANCEUR CLOUDY (A) 06/22/2018 0454   LABSPEC 1.022 06/22/2018 0454   PHURINE 7.0 06/22/2018 0454   GLUCOSEU >=500 (A) 06/22/2018 0454   GLUCOSEU NEGATIVE 04/28/2017 1121   HGBUR NEGATIVE 06/22/2018 0454   BILIRUBINUR NEGATIVE 06/22/2018 0454   KETONESUR NEGATIVE 06/22/2018 0454   PROTEINUR 100 (A) 06/22/2018 0454   UROBILINOGEN 0.2 04/28/2017 1121   NITRITE NEGATIVE 06/22/2018 0454   LEUKOCYTESUR LARGE (A) 06/22/2018 0454   Sepsis Labs Invalid input(s): PROCALCITONIN,  WBC,  LACTICIDVEN Microbiology Recent Results (from the past 240 hour(s))  Culture, blood (Routine x 2)     Status: None (Preliminary result)   Collection Time: 06/22/18  2:20 AM  Result Value Ref Range Status   Specimen Description BLOOD LEFT HAND  Final  Special Requests   Final     BOTTLES DRAWN AEROBIC AND ANAEROBIC Blood Culture results may not be optimal due to an inadequate volume of blood received in culture bottles   Culture   Final    NO GROWTH 4 DAYS Performed at [facility], [place] [address] [place], [place] [address]    Report Status PENDING  Incomplete  Culture, blood (Routine x 2)     Status: None (Preliminary result)   Collection Time: 06/22/18  2:50 AM  Result Value Ref Range Status   Specimen Description BLOOD RIGHT HAND  Final   Special Requests   Final    BOTTLES DRAWN AEROBIC AND ANAEROBIC Blood Culture adequate volume   Culture   Final    NO GROWTH 4 DAYS Performed at [facility], [place] [address], [place], [place] [address]    Report Status PENDING  Incomplete  SARS Coronavirus 2 (CEPHEID - Performed in [place] hospital lab), Hosp Order     Status: None   Collection Time: 06/22/18  4:35 AM  Result Value Ref Range Status   SARS Coronavirus 2 NEGATIVE NEGATIVE Final    Comment: (NOTE) If result is NEGATIVE SARS-CoV-2 target nucleic acids are NOT DETECTED. The SARS-CoV-2 RNA is generally detectable in upper and lower  respiratory specimens during the acute phase of infection. The lowest  concentration of SARS-CoV-2 viral copies this assay can detect is 250  copies / mL. A negative result does not preclude SARS-CoV-2 infection  and should not be used as the sole basis for treatment or other  patient management decisions.  A negative result may occur with  improper specimen collection / handling, submission of specimen other  than nasopharyngeal swab, presence of viral mutation(s) within the  areas targeted by this assay, and inadequate number of viral copies  (<250 copies / mL). A negative result must be combined with clinical  observations, patient history, and epidemiological information. If result is POSITIVE SARS-CoV-2 target nucleic acids are DETECTED. The SARS-CoV-2 RNA is generally detectable in upper and lower   respiratory specimens dur ing the acute phase of infection.  Positive  results are indicative of active infection with SARS-CoV-2.  Clinical  correlation with patient history and other diagnostic information is  necessary to determine patient infection status.  Positive results do  not rule out bacterial infection or co-infection with other viruses. If result is PRESUMPTIVE POSTIVE SARS-CoV-2 nucleic acids MAY BE PRESENT.   A presumptive positive result was obtained on the submitted specimen  and confirmed on repeat testing.  While 2019 novel coronavirus  (SARS-CoV-2) nucleic acids may be present in the submitted sample  additional confirmatory testing may be necessary for epidemiological  and / or clinical management purposes  to differentiate between  SARS-CoV-2 and other Sarbecovirus currently known to infect humans.  If clinically indicated additional testing with an alternate test  methodology [phone]) is advised. The SARS-CoV-2 RNA is generally  detectable in upper and lower respiratory sp ecimens during the acute  phase of infection. The expected result is Negative. Fact Sheet for Patients:  StrictlyIdeas.no Fact Sheet for Healthcare Providers: BankingDealers.co.za This test is not yet approved or cleared by the [place] FDA and has been authorized for detection and/or diagnosis of SARS-CoV-2 by FDA under an Emergency Use Authorization (EUA).  This EUA will remain in effect (meaning this test can be used) for the duration of the COVID-19 declaration under Section 564(b)(1) of the Act, 21 U.S.C. section  360bbb-3(b)(1), unless the authorization is terminated or revoked sooner. Performed at [facility], [place] [address], [place], [place] [address]   Urine culture     Status: Abnormal   Collection Time: 06/22/18  5:42 AM  Result Value Ref Range Status   Specimen Description URINE, SUPRAPUBIC  Final   Special Requests    Final    NONE Performed at [facility], [place] [address] [place], [place] [address]    Culture (A)  Final    >=100,000 COLONIES/mL ENTEROCOCCUS FAECALIS >=100,000 COLONIES/mL METHICILLIN RESISTANT STAPHYLOCOCCUS AUREUS 50,000 COLONIES/mL STENOTROPHOMONAS MALTOPHILIA    Report Status 06/26/2018 FINAL  Final   Organism ID, Bacteria METHICILLIN RESISTANT STAPHYLOCOCCUS AUREUS (A)  Final   Organism ID, Bacteria ENTEROCOCCUS FAECALIS (A)  Final   Organism ID, Bacteria STENOTROPHOMONAS MALTOPHILIA (A)  Final      Susceptibility   Enterococcus faecalis - MIC*    AMPICILLIN <=2 SENSITIVE Sensitive     LEVOFLOXACIN >=8 RESISTANT Resistant     NITROFURANTOIN <=16 SENSITIVE Sensitive     VANCOMYCIN <=0.5 SENSITIVE Sensitive     * >=100,000 COLONIES/mL ENTEROCOCCUS FAECALIS   Methicillin resistant staphylococcus aureus - MIC*    CIPROFLOXACIN >=8 RESISTANT Resistant     GENTAMICIN <=0.5 SENSITIVE Sensitive     NITROFURANTOIN <=16 SENSITIVE Sensitive     OXACILLIN >=4 RESISTANT Resistant     TETRACYCLINE <=1 SENSITIVE Sensitive     VANCOMYCIN 1 SENSITIVE Sensitive     TRIMETH/SULFA >=320 RESISTANT Resistant     CLINDAMYCIN <=0.25 SENSITIVE Sensitive     RIFAMPIN <=0.5 SENSITIVE Sensitive     Inducible Clindamycin NEGATIVE Sensitive     * >=100,000 COLONIES/mL METHICILLIN RESISTANT STAPHYLOCOCCUS AUREUS   Stenotrophomonas maltophilia - MIC*    LEVOFLOXACIN >=8 RESISTANT Resistant     TRIMETH/SULFA >=320 RESISTANT Resistant     * 50,000 COLONIES/mL STENOTROPHOMONAS MALTOPHILIA  Expectorated sputum assessment w rflx to resp cult     Status: None   Collection Time: 06/22/18  8:48 AM  Result Value Ref Range Status   Specimen Description TRACHEAL ASPIRATE  Final   Special Requests NONE  Final   Sputum evaluation   Final    THIS SPECIMEN IS ACCEPTABLE FOR SPUTUM CULTURE Performed at [facility], [address], [place], [place] [address]    Report Status 06/22/2018 FINAL  Final   Culture, respiratory     Status: None   Collection Time: 06/22/18  8:48 AM  Result Value Ref Range Status   Specimen Description TRACHEAL ASPIRATE  Final   Special Requests NONE Reflexed from [id number]  Final   Gram Stain   Final    ABUNDANT WBC PRESENT,BOTH PMN AND MONONUCLEAR FEW GRAM POSITIVE COCCI FEW GRAM NEGATIVE RODS RARE GRAM VARIABLE ROD    Culture   Final    FEW METHICILLIN RESISTANT STAPHYLOCOCCUS AUREUS FEW GROUP B STREP(S.AGALACTIAE)ISOLATED TESTING AGAINST S. AGALACTIAE NOT ROUTINELY PERFORMED DUE TO PREDICTABILITY OF AMP/PEN/VAN SUSCEPTIBILITY. Performed at [facility], [place] [address] [place],  [address]    Report Status 06/25/2018 FINAL  Final   Organism ID, Bacteria METHICILLIN RESISTANT STAPHYLOCOCCUS AUREUS  Final      Susceptibility   Methicillin resistant staphylococcus aureus - MIC*    CIPROFLOXACIN >=8 RESISTANT Resistant     ERYTHROMYCIN >=8 RESISTANT Resistant     GENTAMICIN <=0.5 SENSITIVE Sensitive     OXACILLIN >=4 RESISTANT Resistant     TETRACYCLINE <=1 SENSITIVE Sensitive     VANCOMYCIN  1 SENSITIVE Sensitive     TRIMETH/SULFA >=320 RESISTANT Resistant     CLINDAMYCIN <=0.25 SENSITIVE Sensitive     RIFAMPIN <=0.5 SENSITIVE Sensitive     Inducible Clindamycin NEGATIVE Sensitive     * FEW METHICILLIN RESISTANT STAPHYLOCOCCUS AUREUS  MRSA PCR Screening     Status: Abnormal   Collection Time: 06/22/18  1:13 PM  Result Value Ref Range Status   MRSA by PCR POSITIVE (A) NEGATIVE Final    Comment:        The GeneXpert MRSA Assay (FDA approved for NASAL specimens only), is one component of a comprehensive MRSA colonization surveillance program. It is not intended to diagnose MRSA infection nor to guide or monitor treatment for MRSA infections. RESULT CALLED TO, READ BACK BY AND VERIFIED WITH: Shon Baton RN 15:15 06/22/18 ([email or web address]) Performed at [facility], [place] [address], [place], [place] [address]   Novel Coronavirus, NAA  (hospital order; send-out to ref lab)     Status: None   Collection Time: 06/25/18  9:23 AM  Result Value Ref Range Status   SARS-CoV-2, NAA NOT DETECTED NOT DETECTED Final    Comment: (NOTE) This test was developed and its performance characteristics determined by [employer]. This test has not been FDA cleared or approved. This test has been authorized by FDA under an Emergency Use Authorization (EUA). This test is only authorized for the duration of time the declaration that circumstances exist justifying the authorization of the emergency use of in vitro diagnostic tests for detection of SARS-CoV-2 virus and/or diagnosis of COVID-19 infection under section 564(b)(1) of the Act, 21 U.S.C. 376EGB-1(D)(1), unless the authorization is terminated or revoked sooner. When diagnostic testing is negative, the possibility of a false negative result should be considered in the context of a patient's recent exposures and the presence of clinical signs and symptoms consistent with COVID-19. An individual without symptoms of COVID-19 and who is not shedding SARS-CoV-2 virus would expect to have a negative (not detected) result in this assay. Performed  At: [facility] [address] [place], [place] [id number] Rush Farmer MD GG:[id number]    [place]  Final    Comment: Performed at [facility], [place] [address], [place], [place] [address]     Time coordinating discharge: 33 minutes  SIGNED:   Georgette Shell, MD  [facility] 06/26/2018, 12:55 PM Pager   If 7PM-7AM, please contact night-coverage [email or web address] Password [email or web address]

## 2018-06-27 LAB — GLUCOSE, CAPILLARY
Glucose-Capillary: 115 mg/dL — ABNORMAL HIGH (ref 70–99)
Glucose-Capillary: 153 mg/dL — ABNORMAL HIGH (ref 70–99)

## 2018-06-27 LAB — CULTURE, BLOOD (ROUTINE X 2)
Culture: NO GROWTH
Culture: NO GROWTH
Special Requests: ADEQUATE

## 2018-06-27 MED ORDER — LIP MEDEX EX OINT: TOPICAL_OINTMENT | CUTANEOUS | 0 refills | Status: DC | PRN

## 2018-06-27 MED ORDER — CARBAMIDE PEROXIDE 6.5 % OT SOLN: 5.0000 [drp] | Freq: Two times a day (BID) | OTIC | 0 refills | Status: DC

## 2018-06-27 NOTE — NC FL2 (Signed)
[place] LEVEL OF CARE SCREENING TOOL     IDENTIFICATION  Patient Name: Angela Page Birthdate: Feb 16, 1970 Sex: female Admission Date (Current Location): 06/22/2018  [place] and [place] Number:  Kathleen Argue [id number] [place] and Address:  [facility] [place], [place] [address] [place], [place] [address]      Provider Number: [id number]  Attending Physician Name and Address:  Georgette Shell, MD  Relative Name and Phone Number:  [name], Mother, [phone]    Current Level of Care: Hospital Recommended Level of Care: [place] Prior Approval Number:    Date Approved/Denied:   PASRR Number: [id number]  Discharge Plan: SNF    Current Diagnoses: Patient Active Problem List   Diagnosis Date Noted  . Hypercalcemia 06/23/2018  . Thrombocytosis ([place]) 06/22/2018  . Chronic respiratory failure with hypoxia ([place])   . Acute on chronic respiratory failure ([place])   . Quadriplegia and quadriparesis ([place])   . At high risk for severe sepsis   . Decubitus ulcer of buttock, unstageable ([place])   . Lactic acid acidosis 12/16/2017  . Suprapubic catheter ([place]) 12/16/2017  . Pressure injury of skin 07/17/2017  . Kidney stone 07/16/2017  . Right ureteral stone 03/10/2017  . Complicated UTI (urinary tract infection) 03/09/2017  . Ineffective airway clearance   . Sepsis secondary to UTI ([place]) 06/30/2016  . Autonomic neuropathy 06/10/2016  . Presence of permanent cardiac pacemaker 06/10/2016  . 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 Chronically on opiate therapy 06/10/2016  . PEG (percutaneous endoscopic gastrostomy) status ([place]) 06/10/2016  . Pulmonary hypertension ([place]) 06/10/2016  . Hypoalbuminemia 06/10/2016  . Peripheral edema   . Tracheostomy dependence ([place])   . Chronic obstructive pulmonary disease ([place])   . Chronic diastolic CHF (congestive heart failure) ([place]) 01/23/2016  . Tracheostomy status ([place])    . Esophageal dysphagia   . Malnutrition of moderate degree 11/01/2015  . Tobacco abuse 10/31/2015  . Asthma 10/31/2015  . History of pneumonia 10/08/2015  . Pressure ulcer of contiguous region involving buttock and hip, stage 4 ([place]) 02/26/2015  . Normocytic normochromic anemia 02/25/2015  . Acute metabolic encephalopathy 32/54/9826  . Leukocytosis 10/23/2014  . Hypokalemia 10/23/2014  . Decubitus ulcer of sacral region, stage 4 ([place]) 10/23/2014  . Anemia, iron deficiency 10/23/2014  . Quadriplegia, C5-C7 incomplete ([place]) 10/22/2014  . Depression   . Protein-calorie malnutrition, severe ([place]) 09/05/2014  . 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 Recurrent UTI 03/31/2014  . Pressure ulcer of coccygeal region, stage 4 ([place]) 01/06/2014  . Stage 4 skin ulcer of sacral region ([place]) 01/06/2014  . Neuropathic pain 11/30/2013  . Paraplegia following spinal cord injury ([place]) 11/30/2013  . Spinal cord injury, cervical region ([place]) 11/30/2013  . Chronic pain due to injury 11/30/2013  . S/P cervical spinal fusion 10/27/2013  . Tracheostomy care ([place]) 10/27/2013  . 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 SCI (spinal cord injury) 10/11/2013    Orientation RESPIRATION BLADDER Height & Weight     Self, Time, Situation, Place  Tracheostomy(100, trach, 28) Incontinent(suparpubic cath) Weight: 101 lb 6.6 oz (46 kg) Height:  5\' 2"  (157.5 cm)  BEHAVIORAL SYMPTOMS/MOOD NEUROLOGICAL BOWEL NUTRITION STATUS      Incontinent(gastromoy bag) Diet(low sodium/heart healthy, thin liquids)  AMBULATORY STATUS COMMUNICATION OF NEEDS Skin   Extensive Assist Verbally (pressure injury on sacrum and ischial tuberosity; MASD on ischial tuberosity)  Personal Care Assistance Level of Assistance  Bathing, Feeding, Dressing, Total care Bathing Assistance: Maximum assistance Feeding assistance: Limited assistance Dressing Assistance: Maximum assistance Total Care  Assistance: Maximum assistance   Functional Limitations Info  Sight, Hearing, Speech Sight Info: Adequate Hearing Info: Adequate Speech Info: Adequate    SPECIAL CARE FACTORS FREQUENCY  PT (By licensed PT), OT (By licensed OT)     PT Frequency: 5x/wk OT Frequency: 5x/wk            Contractures Contractures Info: Not present    Additional Factors Info  Code Status, Allergies, Insulin Sliding Scale, Psychotropic Code Status Info: Full Code Allergies Info: Penicillins, Erythromycin, Nitrofurantoin Monohyd Macro, Oxybutynin   Insulin Sliding Scale Info: insulin aspart novolog 0-20 units every 4 hours & insulin glargline (lantus) 15 units at bedtime Isolation Precautions Info: ativan .5mg  daily     Current Medications (06/27/2018):  This is the current hospital active medication list Current Facility-Administered Medications  Medication Dose Route Frequency Provider Last Rate Last Dose  . 0.9 %  sodium chloride infusion  1,000 mL Intravenous Continuous Fuller Plan A, MD 75 mL/hr at 06/26/18 2321 1,000 mL at 06/26/18 2321  . acetaminophen (TYLENOL) tablet 650 mg  650 mg Oral Q6H PRN Fuller Plan A, MD   650 mg at 06/26/18 1742   Or  . acetaminophen (TYLENOL) suppository 650 mg  650 mg Rectal Q6H PRN Smith, Rondell A, MD      . albuterol (PROVENTIL) (2.5 MG/3ML) 0.083% nebulizer solution 2.5 mg  2.5 mg Nebulization Q4H PRN Smith, Rondell A, MD      . baclofen (LIORESAL) tablet 20 mg  20 mg Oral Q8H PRN Rama, Venetia Maxon, MD   20 mg at 06/27/18 0912  . buPROPion (WELLBUTRIN SR) 12 hr tablet 150 mg  150 mg Oral Daily Tamala Julian, Rondell A, MD   150 mg at 06/27/18 0913  . carbamide peroxide (DEBROX) 6.5 % OTIC (EAR) solution 5 drop  5 drop Both EARS BID Georgette Shell, MD   5 drop at 06/27/18 0914  . ceFEPIme (MAXIPIME) 1 g in sodium chloride 0.9 % 100 mL IVPB  1 g Intravenous Q8H Dang, Thuy D, RPH 200 mL/hr at 06/27/18 0504 1 g at 06/27/18 0504  . chlorhexidine (PERIDEX) 0.12 %  solution 15 mL  15 mL Mouth Rinse BID Fuller Plan A, MD   15 mL at 06/27/18 0913  . Chlorhexidine Gluconate Cloth 2 % PADS 6 each  6 each Topical Q0600 Rama, Venetia Maxon, MD   6 each at 06/26/18 [phone]  . DULoxetine (CYMBALTA) DR capsule 30 mg  30 mg Oral BID Fuller Plan A, MD   30 mg at 06/27/18 0912  . enoxaparin (LOVENOX) injection 40 mg  40 mg Subcutaneous QHS Smith, Rondell A, MD   40 mg at 06/26/18 2324  . famotidine (PEPCID) 40 MG/5ML suspension 20 mg  20 mg Per Tube QHS Tamala Julian, Rondell A, MD   20 mg at 06/26/18 2324  . feeding supplement (GLUCERNA 1.2 CAL) liquid 1,000 mL  1,000 mL Per Tube Q24H Smith, Rondell A, MD 57 mL/hr at 06/26/18 1308 1,000 mL at 06/26/18 1308  . feeding supplement (PRO-STAT SUGAR FREE 64) liquid 30 mL  30 mL Oral TID Fuller Plan A, MD   30 mL at 06/27/18 0913  . ferrous sulfate 300 (60 Fe) MG/5ML syrup 220 mg  220 mg Per Tube Q breakfast Fuller Plan A, MD   220 mg at 06/27/18 0912  .  gabapentin (NEURONTIN) capsule 300 mg  300 mg Oral TID Fuller Plan A, MD   300 mg at 06/27/18 0916  . insulin aspart (novoLOG) injection 0-20 Units  0-20 Units Subcutaneous Q4H Rama, Venetia Maxon, MD   4 Units at 06/27/18 0916  . insulin glargine (LANTUS) injection 15 Units  15 Units Subcutaneous QHS Rama, Venetia Maxon, MD   15 Units at 06/26/18 2323  . lip balm (CARMEX) ointment   Topical PRN Georgette Shell, MD      . LORazepam (ATIVAN) tablet 0.5 mg  0.5 mg Oral Q1400 Fuller Plan A, MD   0.5 mg at 06/26/18 1310  . MEDLINE mouth rinse  15 mL Mouth Rinse q12n4p Smith, Rondell A, MD   15 mL at 06/26/18 1744  . metoCLOPramide (REGLAN) tablet 5 mg  5 mg Oral QID Fuller Plan A, MD   5 mg at 06/26/18 2323  . midodrine (PROAMATINE) tablet 10 mg  10 mg Oral TID Fuller Plan A, MD   10 mg at 06/27/18 0912  . mupirocin ointment (BACTROBAN) 2 % 1 application  1 application Nasal BID Rama, Venetia Maxon, MD   1 application at 26/37/85 0916  . ondansetron (ZOFRAN) tablet 4 mg   4 mg Oral Q6H PRN Fuller Plan A, MD   4 mg at 06/24/18 1542   Or  . ondansetron (ZOFRAN) injection 4 mg  4 mg Intravenous Q6H PRN Fuller Plan A, MD   4 mg at 06/22/18 1438  . oxyCODONE (Oxy IR/ROXICODONE) immediate release tablet 5 mg  5 mg Oral Q6H PRN Rama, Venetia Maxon, MD   5 mg at 06/27/18 0912  . polyethylene glycol (MIRALAX / GLYCOLAX) packet 17 g  17 g Oral Daily Fuller Plan A, MD   17 g at 06/25/18 0852  . sodium chloride flush (NS) 0.9 % injection 3 mL  3 mL Intravenous Q12H Smith, Rondell A, MD   3 mL at 06/27/18 0917  . umeclidinium bromide (INCRUSE ELLIPTA) 62.5 MCG/INH 1 puff  1 puff Inhalation Daily Fuller Plan A, MD   1 puff at 06/27/18 0917  . vancomycin (VANCOCIN) IVPB 750 mg/150 ml premix  750 mg Intravenous Q24H Luiz Ochoa, RPH 150 mL/hr at 06/27/18 0616 750 mg at 06/27/18 0616  . zolpidem (AMBIEN) tablet 5 mg  5 mg Oral QHS Rama, Venetia Maxon, MD   5 mg at 06/26/18 2323     Discharge Medications: Please see discharge summary for a list of discharge medications.  Relevant Imaging Results:  Relevant Lab Results:   Additional Information SSN: [id number]  Philippa Chester Mackena Plummer, LCSWA

## 2018-06-27 NOTE — Plan of Care (Signed)

## 2018-06-27 NOTE — Progress Notes (Signed)
Called report to [place] at [facility]. She is being transported by [facility].

## 2018-06-27 NOTE — TOC Transition Note (Signed)
Transition of Care [facility]) - CM/SW Discharge Note   Patient Details  Name: Angela Page MRN: [id number] Date of Birth: Apr 29, 1970  [facility] CM/SW Contact:  Gelene Mink, [place] Phone Number: 06/27/2018, 9:28 AM   Clinical Narrative:     Patient will DC to: [place] Anticipated DC date: 06/27/2018 Family notified: Yes Transport by: Corey Harold   Per MD patient ready for DC to . RN, patient, patient's family, and facility notified of DC. Discharge Summary and FL2 sent to facility. RN to call report prior to discharge ([phone]). The patient will go to room 201P.  DC packet on chart. Ambulance transport requested for patient.   CSW will sign off for now as social work intervention is no longer needed. Please consult [name] again if new needs arise.  Tekoa Hamor, LCSW-A [place]/Clinical Social Work Department Cell: [phone]       Final next level of care: [place] Barriers to Discharge: No Barriers Identified   Patient Goals and CMS Choice Patient states their goals for this hospitalization and ongoing recovery are:: Pt will return to [facility] and continue with rehab CMS Medicare.gov Compare Post Acute Care list provided to:: Other (Comment Required)(Pt will go back to the facility that she arrived from) Choice offered to / list presented to : NA  Discharge Placement   Existing PASRR number confirmed : 06/27/18          Patient chooses bed at: Other - please specify in the comment section below:([facility]) Patient to be transferred to facility by: [place] Name of family member notified: [name], Mother Patient and family notified of of transfer: 06/27/18  Discharge Plan and Services In-house Referral: Clinical Social Work Discharge Planning Services: CM Consult Post Acute Care Choice: Nursing Home          DME Arranged: N/A DME Agency: NA       HH Arranged: NA HH Agency: NA        Social  Determinants of Health (SDOH) Interventions     Readmission Risk Interventions Readmission Risk Prevention Plan 06/24/2018  Transportation Screening Complete  Medication Review Press photographer) Complete  HRI or Home Care Consult Complete  SW Recovery Care/Counseling Consult Complete  Palliative Care Screening Not Applicable  Skilled Nursing Facility Complete  Some recent data might be hidden

## 2018-07-14 IMAGING — CT CT ANGIO CHEST
2 of 8 series · 18 of 46 positions shown · IV contrast (OMNI)
Comparison: CTA of the chest performed 01/19/2016, and chest
radiograph performed earlier today at [DATE] p.m.

CLINICAL DATA: Acute onset of dyspnea.  Initial encounter.

EXAM:
CT ANGIOGRAPHY CHEST WITH CONTRAST
TECHNIQUE: Multidetector CT imaging of the chest was performed using the
standard protocol during bolus administration of intravenous
contrast. Multiplanar CT image reconstructions and MIPs were
obtained to evaluate the vascular anatomy.
CONTRAST:  100 mL of Isovue 370 IV contrast

[Series 6: thins · axial · 0.64mm/px · z∈[+21,+284]mm · 15 of 291 slices shown]
[im 14/291  lung]
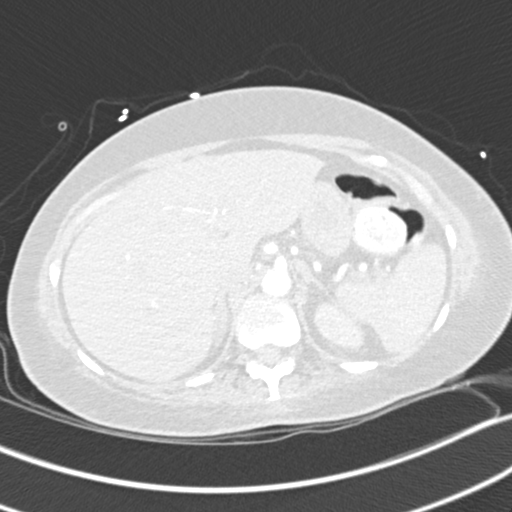
[im 40/291  soft-tissue]
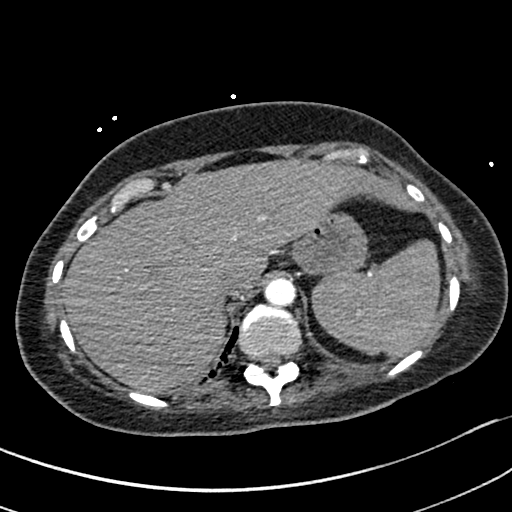
[im 53/291  lung]
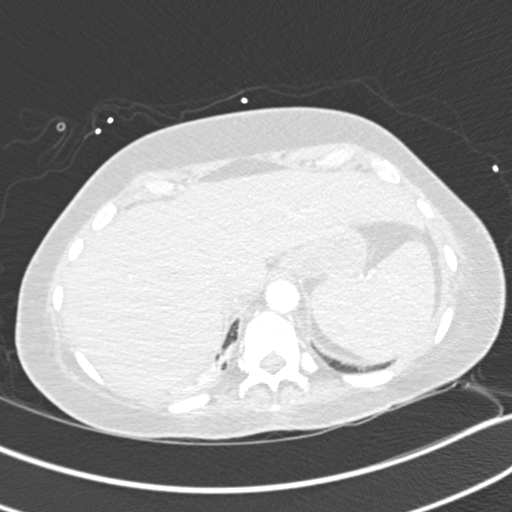
[im 66/291  soft-tissue]
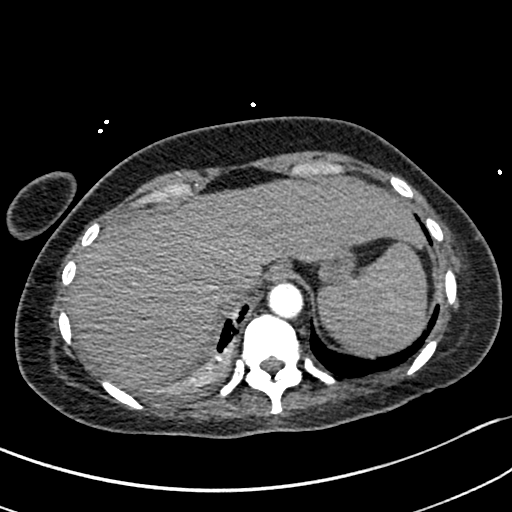
[im 93/291  lung]
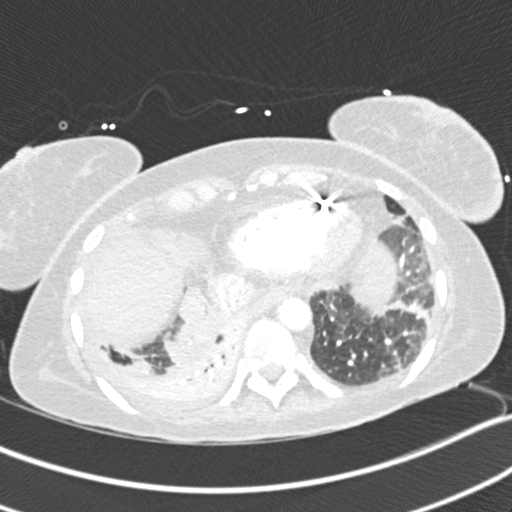
[im 106/291  soft-tissue]
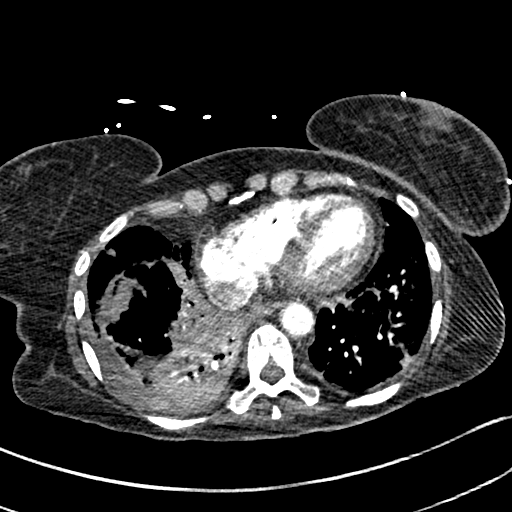
[im 132/291  lung]
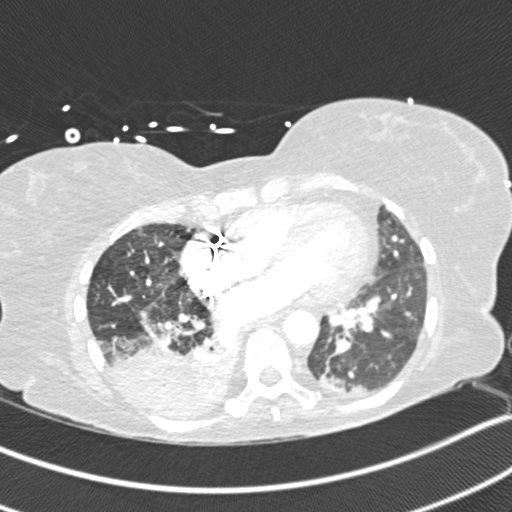
[im 146/291  soft-tissue]
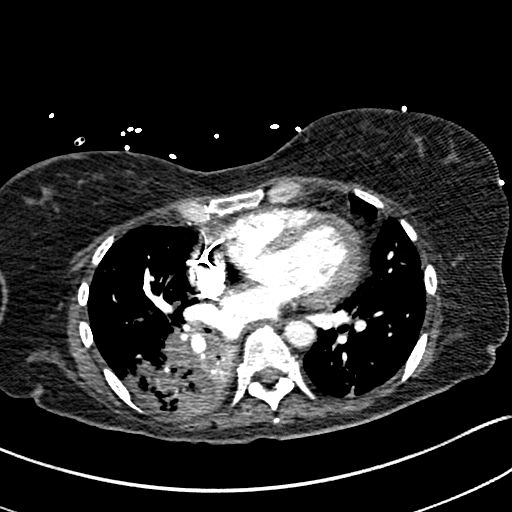
[im 159/291  lung]
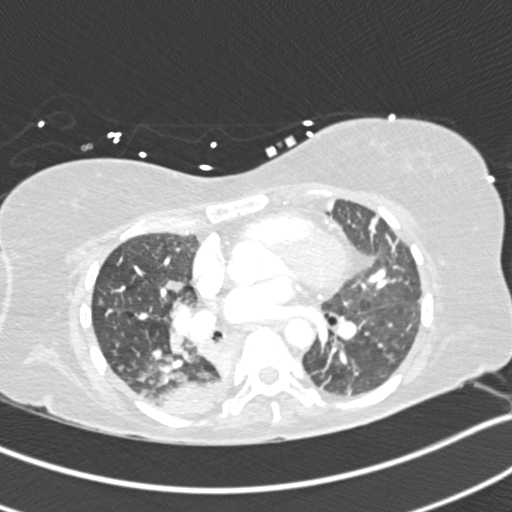
[im 185/291  soft-tissue]
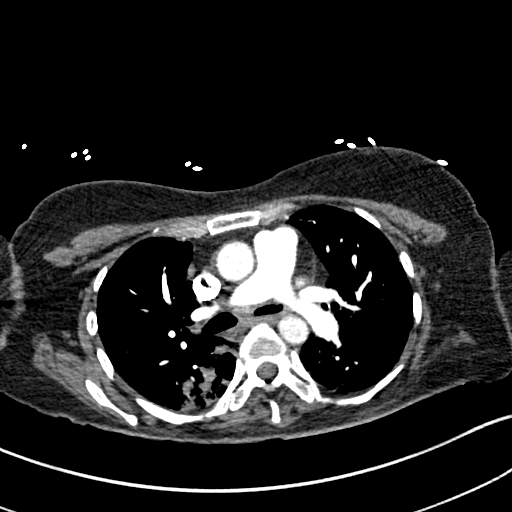
[im 198/291  lung]
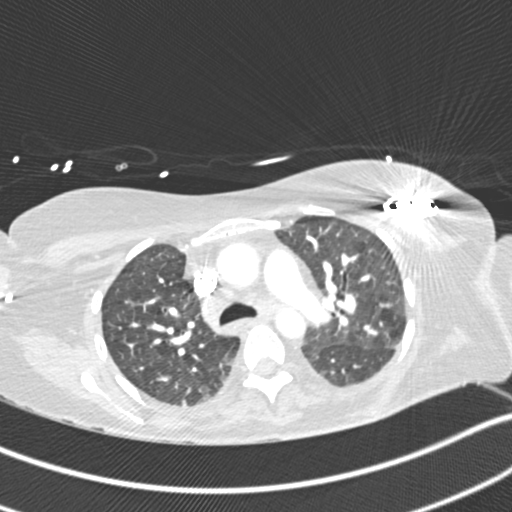
[im 225/291  soft-tissue]
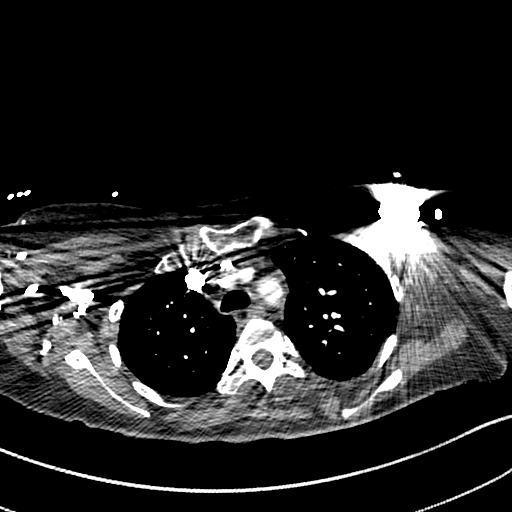
[im 238/291  lung]
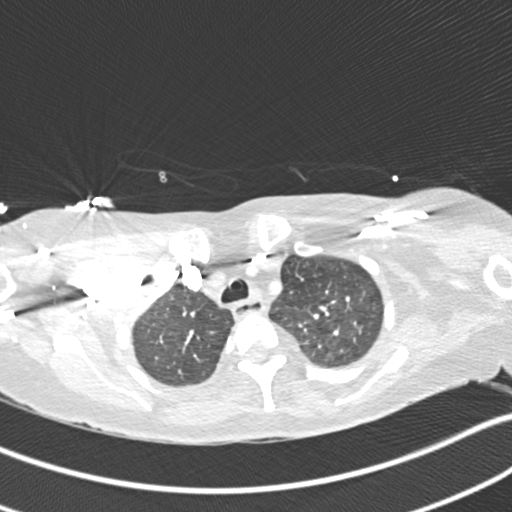
[im 251/291  soft-tissue]
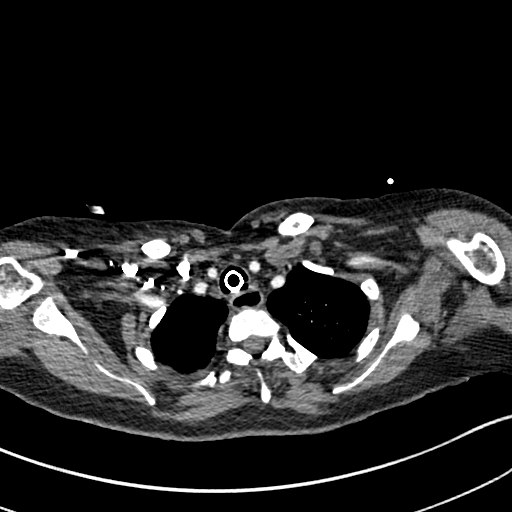
[im 277/291  lung]
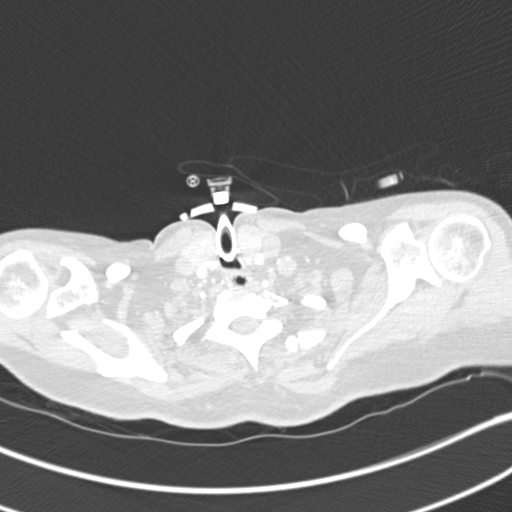

[Series 8: coronal mpr · coronal · 0.59mm/px · 3 of 114 slices shown]
[im 29/114  soft-tissue]
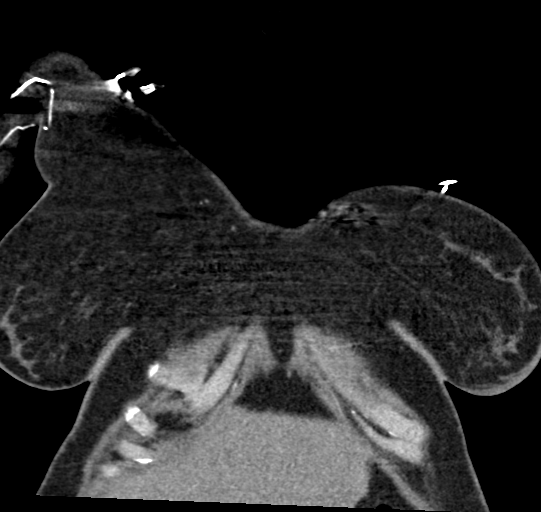
[im 57/114  soft-tissue]
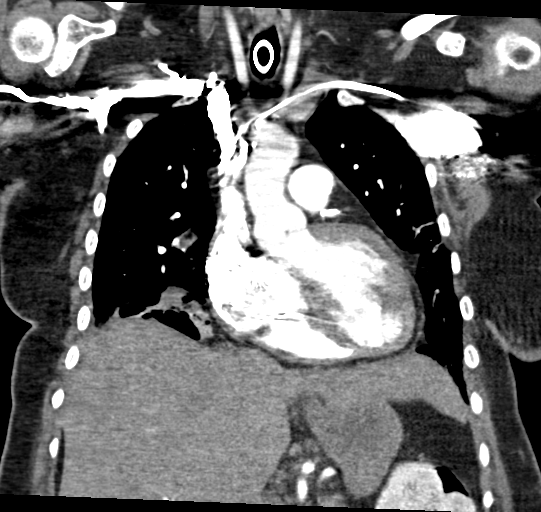
[im 85/114  soft-tissue]
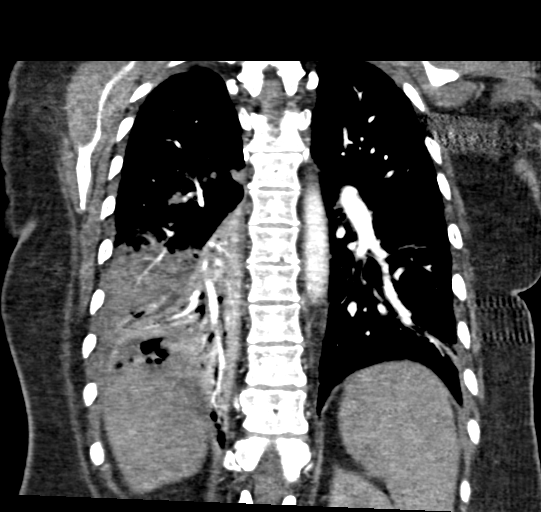

[18 of 46 positions shown; findings below may reference images not displayed]

FINDINGS: Cardiovascular:  There is no evidence of pulmonary embolus.

The heart is borderline normal in size. Pacemaker leads are noted
ending at the right atrium and right ventricle. A left chest wall
pacemaker is seen. Diffuse coronary artery calcifications are
appreciated. There is minimal calcification along the aortic arch
and proximal great vessels.

Mediastinum/Nodes: A 1.4 cm subcarinal node is suggested. No
additional mediastinal lymphadenopathy is seen. No pericardial
effusion is identified. The patient's tracheostomy tube is grossly
unremarkable in appearance. The visualized portions of the thyroid
gland are unremarkable. No axillary lymphadenopathy is appreciated.

Lungs/Pleura: There is complete consolidation of the right lower
lobe, and dense airspace opacification involving portions of the
right upper and middle lobes. Minimal opacity is also noted at the
left lower lobe and left lingula. Findings are compatible with
bilateral pneumonia. A trace right-sided pleural effusion is noted.
No pneumothorax is seen. No dominant mass is identified.

Given partial opacification of the bronchioles to the right lower
lobe, would correlate clinically for evidence of aspiration.

Upper Abdomen: The visualized portions of the liver and spleen are
grossly unremarkable. The visualized portions of the adrenal glands
and kidneys are within normal limits.

Musculoskeletal: No acute osseous abnormalities are identified. The
visualized musculature is unremarkable in appearance.

Review of the MIP images confirms the above findings.
IMPRESSION: 1. No evidence of pulmonary embolus.
2. Complete consolidation of the right lower lung lobe, and dense
airspace opacification involving portions of the right upper and
middle lobes. Minimal airspace opacity at the left lower lobe and
left lingula. Findings compatible with bilateral pneumonia. Trace
right-sided pleural effusion noted. Given partial opacification of
the bronchioles to the right lower lobe, underlying aspiration is
suspected. Would correlate clinically.
3. Diffuse coronary artery calcifications noted.
4. 1.4 cm subcarinal node may reflect the underlying pneumonia.

## 2018-07-20 ENCOUNTER — Emergency Department (HOSPITAL_COMMUNITY): Payer: Medicare Other

## 2018-07-20 ENCOUNTER — Inpatient Hospital Stay (HOSPITAL_COMMUNITY)
Admission: EM | Admit: 2018-07-20 | Discharge: 2018-07-23 | DRG: 698 | Disposition: A | Discharge status: Skilled Nursing Facility | Payer: Medicare Other | Source: Skilled Nursing Facility | Attending: Internal Medicine | Admitting: Internal Medicine

## 2018-07-20 ENCOUNTER — Encounter (HOSPITAL_COMMUNITY): Payer: Self-pay | Admitting: *Deleted

## 2018-07-20 ENCOUNTER — Observation Stay (HOSPITAL_COMMUNITY): Payer: Medicare Other

## 2018-07-20 ENCOUNTER — Other Ambulatory Visit: Payer: Self-pay

## 2018-07-20 DIAGNOSIS — L89154 Pressure ulcer of sacral region, stage 4: Secondary | ICD-10-CM | POA: Diagnosis present

## 2018-07-20 DIAGNOSIS — E119 Type 2 diabetes mellitus without complications: Secondary | ICD-10-CM | POA: Diagnosis present

## 2018-07-20 DIAGNOSIS — B964 Proteus (mirabilis) (morganii) as the cause of diseases classified elsewhere: Secondary | ICD-10-CM | POA: Diagnosis present

## 2018-07-20 DIAGNOSIS — L89213 Pressure ulcer of right hip, stage 3: Secondary | ICD-10-CM | POA: Diagnosis present

## 2018-07-20 DIAGNOSIS — Z981 Arthrodesis status: Secondary | ICD-10-CM

## 2018-07-20 DIAGNOSIS — R4182 Altered mental status, unspecified: Secondary | ICD-10-CM

## 2018-07-20 DIAGNOSIS — F329 Major depressive disorder, single episode, unspecified: Secondary | ICD-10-CM | POA: Diagnosis present

## 2018-07-20 DIAGNOSIS — G8254 Quadriplegia, C5-C7 incomplete: Secondary | ICD-10-CM | POA: Diagnosis present

## 2018-07-20 DIAGNOSIS — Z431 Encounter for attention to gastrostomy: Secondary | ICD-10-CM | POA: Diagnosis not present

## 2018-07-20 DIAGNOSIS — J449 Chronic obstructive pulmonary disease, unspecified: Secondary | ICD-10-CM | POA: Diagnosis present

## 2018-07-20 DIAGNOSIS — I9589 Other hypotension: Secondary | ICD-10-CM | POA: Diagnosis not present

## 2018-07-20 DIAGNOSIS — Z8614 Personal history of Methicillin resistant Staphylococcus aureus infection: Secondary | ICD-10-CM

## 2018-07-20 DIAGNOSIS — L89223 Pressure ulcer of left hip, stage 3: Secondary | ICD-10-CM | POA: Diagnosis present

## 2018-07-20 DIAGNOSIS — Z1159 Encounter for screening for other viral diseases: Secondary | ICD-10-CM | POA: Diagnosis not present

## 2018-07-20 DIAGNOSIS — G9340 Encephalopathy, unspecified: Secondary | ICD-10-CM | POA: Diagnosis present

## 2018-07-20 DIAGNOSIS — Z930 Tracheostomy status: Secondary | ICD-10-CM | POA: Diagnosis not present

## 2018-07-20 DIAGNOSIS — Z800 Family history of malignant neoplasm of digestive organs: Secondary | ICD-10-CM

## 2018-07-20 DIAGNOSIS — Z87440 Personal history of urinary (tract) infections: Secondary | ICD-10-CM | POA: Diagnosis not present

## 2018-07-20 DIAGNOSIS — Y846 Urinary catheterization as the cause of abnormal reaction of the patient, or of later complication, without mention of misadventure at the time of the procedure: Secondary | ICD-10-CM | POA: Diagnosis present

## 2018-07-20 DIAGNOSIS — K9423 Gastrostomy malfunction: Secondary | ICD-10-CM | POA: Diagnosis present

## 2018-07-20 DIAGNOSIS — Z888 Allergy status to other drugs, medicaments and biological substances status: Secondary | ICD-10-CM | POA: Diagnosis not present

## 2018-07-20 DIAGNOSIS — Z880 Allergy status to penicillin: Secondary | ICD-10-CM | POA: Diagnosis not present

## 2018-07-20 DIAGNOSIS — K219 Gastro-esophageal reflux disease without esophagitis: Secondary | ICD-10-CM | POA: Diagnosis present

## 2018-07-20 DIAGNOSIS — N390 Urinary tract infection, site not specified: Secondary | ICD-10-CM | POA: Diagnosis not present

## 2018-07-20 DIAGNOSIS — Z931 Gastrostomy status: Secondary | ICD-10-CM | POA: Diagnosis not present

## 2018-07-20 DIAGNOSIS — A4150 Gram-negative sepsis, unspecified: Secondary | ICD-10-CM

## 2018-07-20 DIAGNOSIS — E8809 Other disorders of plasma-protein metabolism, not elsewhere classified: Secondary | ICD-10-CM | POA: Diagnosis present

## 2018-07-20 DIAGNOSIS — F17210 Nicotine dependence, cigarettes, uncomplicated: Secondary | ICD-10-CM | POA: Diagnosis present

## 2018-07-20 DIAGNOSIS — T83510A Infection and inflammatory reaction due to cystostomy catheter, initial encounter: Principal | ICD-10-CM | POA: Diagnosis present

## 2018-07-20 DIAGNOSIS — Z9359 Other cystostomy status: Secondary | ICD-10-CM | POA: Diagnosis not present

## 2018-07-20 DIAGNOSIS — G9341 Metabolic encephalopathy: Secondary | ICD-10-CM | POA: Diagnosis present

## 2018-07-20 DIAGNOSIS — Z950 Presence of cardiac pacemaker: Secondary | ICD-10-CM

## 2018-07-20 DIAGNOSIS — Z1623 Resistance to quinolones and fluoroquinolones: Secondary | ICD-10-CM | POA: Diagnosis present

## 2018-07-20 DIAGNOSIS — R600 Localized edema: Secondary | ICD-10-CM | POA: Diagnosis present

## 2018-07-20 DIAGNOSIS — R609 Edema, unspecified: Secondary | ICD-10-CM

## 2018-07-20 DIAGNOSIS — G8250 Quadriplegia, unspecified: Secondary | ICD-10-CM

## 2018-07-20 DIAGNOSIS — Z8249 Family history of ischemic heart disease and other diseases of the circulatory system: Secondary | ICD-10-CM

## 2018-07-20 DIAGNOSIS — A4159 Other Gram-negative sepsis: Secondary | ICD-10-CM | POA: Diagnosis present

## 2018-07-20 DIAGNOSIS — Z794 Long term (current) use of insulin: Secondary | ICD-10-CM | POA: Diagnosis not present

## 2018-07-20 DIAGNOSIS — Z79899 Other long term (current) drug therapy: Secondary | ICD-10-CM

## 2018-07-20 DIAGNOSIS — Z9981 Dependence on supplemental oxygen: Secondary | ICD-10-CM

## 2018-07-20 DIAGNOSIS — Z801 Family history of malignant neoplasm of trachea, bronchus and lung: Secondary | ICD-10-CM

## 2018-07-20 LAB — URINALYSIS, ROUTINE W REFLEX MICROSCOPIC
Bilirubin Urine: NEGATIVE
Glucose, UA: 50 mg/dL — AB
Hgb urine dipstick: NEGATIVE
Ketones, ur: NEGATIVE mg/dL
Nitrite: NEGATIVE
Protein, ur: >=300 mg/dL — AB
Specific Gravity, Urine: 1.012 (ref 1.005–1.030)
pH: 9 — ABNORMAL HIGH (ref 5.0–8.0)

## 2018-07-20 LAB — CBC WITH DIFFERENTIAL/PLATELET
Abs Immature Granulocytes: 0.04 10*3/uL (ref 0.00–0.07)
Basophils Absolute: 0.1 10*3/uL (ref 0.0–0.1)
Basophils Relative: 0 %
Eosinophils Absolute: 0.4 10*3/uL (ref 0.0–0.5)
Eosinophils Relative: 3 %
HCT: 38.5 % (ref 36.0–46.0)
Hemoglobin: 11.9 g/dL — ABNORMAL LOW (ref 12.0–15.0)
Immature Granulocytes: 0 %
Lymphocytes Relative: 14 %
Lymphs Abs: 1.6 10*3/uL (ref 0.7–4.0)
MCH: 28.3 pg (ref 26.0–34.0)
MCHC: 30.9 g/dL (ref 30.0–36.0)
MCV: 91.4 fL (ref 80.0–100.0)
Monocytes Absolute: 0.6 10*3/uL (ref 0.1–1.0)
Monocytes Relative: 5 %
Neutro Abs: 8.8 10*3/uL — ABNORMAL HIGH (ref 1.7–7.7)
Neutrophils Relative %: 78 %
Platelets: 403 10*3/uL — ABNORMAL HIGH (ref 150–400)
RBC: 4.21 MIL/uL (ref 3.87–5.11)
RDW: 15.1 % (ref 11.5–15.5)
WBC: 11.4 10*3/uL — ABNORMAL HIGH (ref 4.0–10.5)
nRBC: 0 % (ref 0.0–0.2)

## 2018-07-20 LAB — COMPREHENSIVE METABOLIC PANEL
ALT: 29 U/L (ref 0–44)
AST: 17 U/L (ref 15–41)
Albumin: 3.3 g/dL — ABNORMAL LOW (ref 3.5–5.0)
Alkaline Phosphatase: 166 U/L — ABNORMAL HIGH (ref 38–126)
Anion gap: 13 (ref 5–15)
BUN: 26 mg/dL — ABNORMAL HIGH (ref 6–20)
CO2: 25 mmol/L (ref 22–32)
Calcium: 10.6 mg/dL — ABNORMAL HIGH (ref 8.9–10.3)
Chloride: 97 mmol/L — ABNORMAL LOW (ref 98–111)
Creatinine, Ser: 0.53 mg/dL (ref 0.44–1.00)
GFR calc Af Amer: >60 mL/min (ref >60)
GFR calc non Af Amer: >60 mL/min (ref >60)
Glucose, Bld: 200 mg/dL — ABNORMAL HIGH (ref 70–99)
Potassium: 3.9 mmol/L (ref 3.5–5.1)
Sodium: 135 mmol/L (ref 135–145)
Total Bilirubin: 0.2 mg/dL — ABNORMAL LOW (ref 0.3–1.2)
Total Protein: 8.2 g/dL — ABNORMAL HIGH (ref 6.5–8.1)

## 2018-07-20 LAB — POCT I-STAT EG7
Acid-Base Excess: 5 mmol/L — ABNORMAL HIGH (ref 0.0–2.0)
Bicarbonate: 31.4 mmol/L — ABNORMAL HIGH (ref 20.0–28.0)
Calcium, Ion: 1.41 mmol/L — ABNORMAL HIGH (ref 1.15–1.40)
HCT: 38 % (ref 36.0–46.0)
Hemoglobin: 12.9 g/dL (ref 12.0–15.0)
O2 Saturation: 91 %
Patient temperature: 96.6
Potassium: 4.1 mmol/L (ref 3.5–5.1)
Sodium: 141 mmol/L (ref 135–145)
TCO2: 33 mmol/L — ABNORMAL HIGH (ref 22–32)
pCO2, Ven: 48.8 mmHg (ref 44.0–60.0)
pH, Ven: 7.412 (ref 7.250–7.430)
pO2, Ven: 58 mmHg — ABNORMAL HIGH (ref 32.0–45.0)

## 2018-07-20 LAB — HEMOGLOBIN A1C
Hgb A1c MFr Bld: 7.1 % — ABNORMAL HIGH (ref 4.8–5.6)
Mean Plasma Glucose: 157.07 mg/dL

## 2018-07-20 LAB — GLUCOSE, CAPILLARY
Glucose-Capillary: 181 mg/dL — ABNORMAL HIGH (ref 70–99)
Glucose-Capillary: 172 mg/dL — ABNORMAL HIGH (ref 70–99)
Glucose-Capillary: 128 mg/dL — ABNORMAL HIGH (ref 70–99)
Glucose-Capillary: 200 mg/dL — ABNORMAL HIGH (ref 70–99)

## 2018-07-20 LAB — LACTIC ACID, PLASMA
Lactic Acid, Venous: 1.2 mmol/L (ref 0.5–1.9)
Lactic Acid, Venous: 1.2 mmol/L (ref 0.5–1.9)

## 2018-07-20 LAB — SARS CORONAVIRUS 2 BY RT PCR (HOSPITAL ORDER, PERFORMED IN ~~LOC~~ HOSPITAL LAB): SARS Coronavirus 2: NEGATIVE

## 2018-07-20 LAB — HIV ANTIBODY (ROUTINE TESTING W REFLEX): HIV Screen 4th Generation wRfx: NONREACTIVE

## 2018-07-20 MED ORDER — INSULIN GLARGINE 100 UNIT/ML ~~LOC~~ SOLN
5.0000 [IU] | Freq: Every day | SUBCUTANEOUS | Status: DC
  Administered 2018-07-20: 22:00:00 5 [IU] via SUBCUTANEOUS
  Filled 2018-07-20 (×2): qty 0.05

## 2018-07-20 MED ORDER — ADULT MULTIVITAMIN LIQUID CH
15.0000 mL | Freq: Every day | ORAL | Status: DC
  Administered 2018-07-20 – 2018-07-23 (×4): 15 mL
  Filled 2018-07-20 (×4): qty 15

## 2018-07-20 MED ORDER — METRONIDAZOLE IN NACL 5-0.79 MG/ML-% IV SOLN
500.0000 mg | Freq: Once | INTRAVENOUS | Status: AC
  Administered 2018-07-20: 500 mg via INTRAVENOUS
  Filled 2018-07-20: qty 100

## 2018-07-20 MED ORDER — HEPARIN SODIUM (PORCINE) 5000 UNIT/ML IJ SOLN
5000.0000 [IU] | Freq: Three times a day (TID) | INTRAMUSCULAR | Status: DC
  Administered 2018-07-20 – 2018-07-23 (×11): 5000 [IU] via SUBCUTANEOUS
  Filled 2018-07-20 (×11): qty 1

## 2018-07-20 MED ORDER — SODIUM CHLORIDE 0.9 % IV SOLN
2.0000 g | Freq: Two times a day (BID) | INTRAVENOUS | Status: DC
  Administered 2018-07-20 – 2018-07-23 (×7): 2 g via INTRAVENOUS
  Filled 2018-07-20 (×7): qty 2

## 2018-07-20 MED ORDER — DULAGLUTIDE 1.5 MG/0.5ML ~~LOC~~ SOAJ: 1.5000 mg | SUBCUTANEOUS | Status: DC

## 2018-07-20 MED ORDER — FAMOTIDINE 40 MG/5ML PO SUSR
20.0000 mg | Freq: Every day | ORAL | Status: DC
  Administered 2018-07-20 – 2018-07-22 (×3): 20 mg
  Filled 2018-07-20 (×4): qty 2.5

## 2018-07-20 MED ORDER — DULOXETINE HCL 30 MG PO CPEP
30.0000 mg | ORAL_CAPSULE | Freq: Two times a day (BID) | ORAL | Status: DC
  Administered 2018-07-21 – 2018-07-23 (×5): 30 mg via ORAL
  Filled 2018-07-20 (×6): qty 1

## 2018-07-20 MED ORDER — INSULIN ASPART 100 UNIT/ML ~~LOC~~ SOLN
0.0000 [IU] | SUBCUTANEOUS | Status: DC
  Administered 2018-07-20 (×3): 2 [IU] via SUBCUTANEOUS
  Administered 2018-07-20: 1 [IU] via SUBCUTANEOUS
  Administered 2018-07-21 (×4): 3 [IU] via SUBCUTANEOUS
  Administered 2018-07-21: 5 [IU] via SUBCUTANEOUS
  Administered 2018-07-21: 02:00:00 1 [IU] via SUBCUTANEOUS
  Administered 2018-07-21 – 2018-07-22 (×4): 3 [IU] via SUBCUTANEOUS
  Administered 2018-07-22: 2 [IU] via SUBCUTANEOUS
  Administered 2018-07-22 – 2018-07-23 (×4): 3 [IU] via SUBCUTANEOUS
  Administered 2018-07-23 (×2): 2 [IU] via SUBCUTANEOUS

## 2018-07-20 MED ORDER — JEVITY 1.2 CAL PO LIQD
1000.0000 mL | ORAL | Status: DC
  Administered 2018-07-20: 1000 mL
  Filled 2018-07-20 (×5): qty 1000

## 2018-07-20 MED ORDER — MIDODRINE HCL 5 MG PO TABS
10.0000 mg | ORAL_TABLET | Freq: Three times a day (TID) | ORAL | Status: DC
  Administered 2018-07-20 – 2018-07-22 (×7): 10 mg via ORAL
  Filled 2018-07-20 (×8): qty 2

## 2018-07-20 MED ORDER — SODIUM CHLORIDE 0.9 % IV SOLN: 2.0000 g | Freq: Three times a day (TID) | INTRAVENOUS | Status: DC

## 2018-07-20 MED ORDER — FREE WATER
125.0000 mL | Freq: Four times a day (QID) | Status: DC
  Administered 2018-07-20 – 2018-07-23 (×12): 125 mL

## 2018-07-20 MED ORDER — POLYETHYLENE GLYCOL 3350 17 G PO PACK
17.0000 g | PACK | Freq: Every day | ORAL | Status: DC
  Administered 2018-07-21: 17 g via ORAL
  Filled 2018-07-20 (×4): qty 1

## 2018-07-20 MED ORDER — GABAPENTIN 300 MG PO CAPS
300.0000 mg | ORAL_CAPSULE | Freq: Three times a day (TID) | ORAL | Status: DC
  Administered 2018-07-20 – 2018-07-23 (×10): 300 mg via ORAL
  Filled 2018-07-20 (×11): qty 1

## 2018-07-20 MED ORDER — METOCLOPRAMIDE HCL 5 MG PO TABS
5.0000 mg | ORAL_TABLET | Freq: Four times a day (QID) | ORAL | Status: DC
  Administered 2018-07-20 – 2018-07-23 (×14): 5 mg via ORAL
  Filled 2018-07-20 (×15): qty 1

## 2018-07-20 MED ORDER — IPRATROPIUM-ALBUTEROL 0.5-2.5 (3) MG/3ML IN SOLN: 3.0000 mL | Freq: Four times a day (QID) | RESPIRATORY_TRACT | Status: DC | PRN

## 2018-07-20 MED ORDER — FERROUS SULFATE 300 (60 FE) MG/5ML PO SYRP
220.0000 mg | ORAL_SOLUTION | Freq: Every day | ORAL | Status: DC
  Administered 2018-07-21 – 2018-07-22 (×2): 220 mg
  Administered 2018-07-23: 300 mg
  Filled 2018-07-20 (×5): qty 5

## 2018-07-20 MED ORDER — ZOLPIDEM TARTRATE 5 MG PO TABS
2.5000 mg | ORAL_TABLET | Freq: Every day | ORAL | Status: DC
  Administered 2018-07-20 – 2018-07-22 (×3): 2.5 mg via ORAL
  Filled 2018-07-20 (×3): qty 1

## 2018-07-20 MED ORDER — LORAZEPAM 0.5 MG PO TABS
0.5000 mg | ORAL_TABLET | Freq: Three times a day (TID) | ORAL | Status: DC | PRN
  Administered 2018-07-21 – 2018-07-22 (×3): 0.5 mg via ORAL
  Filled 2018-07-20 (×3): qty 1

## 2018-07-20 MED ORDER — METRONIDAZOLE IN NACL 5-0.79 MG/ML-% IV SOLN
500.0000 mg | Freq: Three times a day (TID) | INTRAVENOUS | Status: DC
  Administered 2018-07-20 – 2018-07-21 (×4): 500 mg via INTRAVENOUS
  Filled 2018-07-20 (×4): qty 100

## 2018-07-20 MED ORDER — SODIUM CHLORIDE 0.9 % IV SOLN: 2.0000 g | Freq: Once | INTRAVENOUS | Status: DC

## 2018-07-20 MED ORDER — SODIUM CHLORIDE 0.9 % IV SOLN
2.0000 g | Freq: Once | INTRAVENOUS | Status: AC
  Administered 2018-07-20: 2 g via INTRAVENOUS
  Filled 2018-07-20: qty 2

## 2018-07-20 MED ORDER — UMECLIDINIUM BROMIDE 62.5 MCG/INH IN AEPB
1.0000 | INHALATION_SPRAY | Freq: Every day | RESPIRATORY_TRACT | Status: DC
  Administered 2018-07-21 – 2018-07-23 (×3): 1 via RESPIRATORY_TRACT
  Filled 2018-07-20: qty 7

## 2018-07-20 MED ORDER — LIP MEDEX EX OINT: TOPICAL_OINTMENT | CUTANEOUS | Status: DC | PRN

## 2018-07-20 MED ORDER — VANCOMYCIN HCL IN DEXTROSE 750-5 MG/150ML-% IV SOLN
750.0000 mg | INTRAVENOUS | Status: DC
  Administered 2018-07-21: 02:00:00 750 mg via INTRAVENOUS
  Filled 2018-07-20: qty 150

## 2018-07-20 MED ORDER — VANCOMYCIN HCL IN DEXTROSE 1-5 GM/200ML-% IV SOLN
1000.0000 mg | Freq: Once | INTRAVENOUS | Status: AC
  Administered 2018-07-20: 1000 mg via INTRAVENOUS
  Filled 2018-07-20: qty 200

## 2018-07-20 MED ORDER — PRO-STAT SUGAR FREE PO LIQD
30.0000 mL | Freq: Every day | ORAL | Status: DC
  Administered 2018-07-20 – 2018-07-23 (×4): 30 mL
  Filled 2018-07-20 (×4): qty 30

## 2018-07-20 MED ORDER — ORAL CARE MOUTH RINSE
15.0000 mL | Freq: Two times a day (BID) | OROMUCOSAL | Status: DC
  Administered 2018-07-20 – 2018-07-23 (×8): 15 mL via OROMUCOSAL

## 2018-07-20 MED ORDER — HYDROCODONE-ACETAMINOPHEN 5-325 MG PO TABS
1.0000 | ORAL_TABLET | ORAL | Status: DC | PRN
  Administered 2018-07-20 (×2): 2 via ORAL
  Administered 2018-07-20: 1 via ORAL
  Administered 2018-07-21 – 2018-07-22 (×6): 2 via ORAL
  Filled 2018-07-20: qty 1
  Filled 2018-07-20: qty 2
  Filled 2018-07-20: qty 1
  Filled 2018-07-20 (×7): qty 2

## 2018-07-20 MED ORDER — SODIUM CHLORIDE 0.9 % IV SOLN
1.0000 g | Freq: Three times a day (TID) | INTRAVENOUS | Status: DC
  Filled 2018-07-20 (×2): qty 1

## 2018-07-20 MED ORDER — BUPROPION HCL ER (SR) 150 MG PO TB12
150.0000 mg | ORAL_TABLET | Freq: Every day | ORAL | Status: DC
  Administered 2018-07-21 – 2018-07-23 (×3): 150 mg via ORAL
  Filled 2018-07-20 (×4): qty 1

## 2018-07-20 MED ORDER — CHLORHEXIDINE GLUCONATE 0.12 % MT SOLN
15.0000 mL | Freq: Two times a day (BID) | OROMUCOSAL | Status: DC
  Administered 2018-07-20 – 2018-07-23 (×7): 15 mL via OROMUCOSAL
  Filled 2018-07-20 (×7): qty 15

## 2018-07-20 NOTE — Progress Notes (Signed)
PEG tube would not flush. All medications had already been crushed. Dr. Cruzita Lederer notified about PEG tube not flushing and abdomen being hard and slightly distended. All morning medications that were crushed including hydrocodone were disposed in stericycle in med room on 3W. Arta Silence, RN witnessed. [place]

## 2018-07-20 NOTE — Progress Notes (Addendum)
Pharmacy Antibiotic Note  Angela Page is a 48 y.o. female admitted on 07/20/2018 with sepsis.  Pharmacy has been consulted for cefepime (for aztreonam as has tolerated cephalosporins in the past) and vancomycin dosing. She had admission earlier this month and was on vancomycin and cefepime. Noted quadriplegic s/p cervical spine trauma with chronic trach. SCr 0.53 on admit (baseline ~0.3-0.6).  Plan: Cefepime to 2g IV q12h Vancomycin 750 mg IV q24h (using dosing based on levels from previous admit this month) Flagyl 500mg  IV q8h per MD Monitor clinical progress, c/s, renal function F/u de-escalation plan/LOT, vancomycin levels as indicated   Weight: 100 lb 8.5 oz (45.6 kg)  Temp (24hrs), Avg:97.7 F (36.5 C), Min:96.3 F (35.7 C), Max:98.9 F (37.2 C)  Recent Labs  Lab 07/20/18 0045 07/20/18 0702  WBC 11.4*  --   CREATININE 0.53  --   LATICACIDVEN 1.2 1.2    Estimated Creatinine Clearance: 62.6 mL/min (by C-G formula based on SCr of 0.53 mg/dL).    Allergies  Allergen Reactions  . Penicillins Hives    Has patient had a PCN reaction causing immediate rash, facial/tongue/throat swelling, SOB or lightheadedness with hypotension: Yes Has patient had a PCN reaction causing severe rash involving mucus membranes or skin necrosis: Yes Has patient had a PCN reaction that required hospitalization No Has patient had a PCN reaction occurring within the last 10 years: No-childhood allergy If all of the above answers are "NO", then may proceed with Cephalosporin  Tolerated cephalosporins in past  . Erythromycin Nausea And Vomiting  . Nitrofurantoin Monohyd Macro Nausea And Vomiting  . Oxybutynin     Dries mouth out     Elicia Lamp, PharmD, BCPS Please check AMION for all [place] contact numbers Clinical Pharmacist 07/20/2018 8:59 AM

## 2018-07-20 NOTE — ED Notes (Signed)
ED TO INPATIENT HANDOFF REPORT  ED Nurse Name and Phone #: [phone]  S Name/Age/Gender Angela Page 48 y.o. female Room/Bed: TRAAC/TRAAC  Code Status   Code Status: Prior  Home/SNF/Other Skilled nursing facility [employer], alert  Is this baseline? No   Triage Complete: Triage complete  Chief Complaint Altered Mental Status   Triage Note Pt from [place] for [place]; unknown last known well. At baseline staff reported to EMS that pt was AXOx4, pt nonverbal at present, moans with movement. Catheter and trach in place. Catheter bag emptied pta.    Allergies Allergies  Allergen Reactions  . Penicillins Hives    Has patient had a PCN reaction causing immediate rash, facial/tongue/throat swelling, SOB or lightheadedness with hypotension: Yes Has patient had a PCN reaction causing severe rash involving mucus membranes or skin necrosis: Yes Has patient had a PCN reaction that required hospitalization No Has patient had a PCN reaction occurring within the last 10 years: No-childhood allergy If all of the above answers are "NO", then may proceed with Cephalosporin  Tolerated cephalosporins in past  . Erythromycin Nausea And Vomiting  . Nitrofurantoin Monohyd Macro Nausea And Vomiting  . Oxybutynin     Dries mouth out     Level of Care/Admitting Diagnosis ED Disposition    ED Disposition Condition [facility] Area: [place] [[id number]]  Level of Care: Telemetry Medical [104]  I expect the patient will be discharged within 24 hours: Yes  LOW acuity---Tx typically complete <24 hrs---ACUTE conditions typically can be evaluated <24 hours---LABS likely to return to acceptable levels <24 hours---IS near functional baseline---EXPECTED to return to current living arrangement---NOT newly hypoxic: Does not meet criteria for 5C-Observation unit  Covid Evaluation: Screening Protocol (No Symptoms)  Diagnosis: Acute encephalopathy  [604540]  Admitting Physician: Vianne Bulls [[id number]]  Attending Physician: Vianne Bulls [[id number]]  PT Class (Do Not Modify): Observation [104]  PT Acc Code (Do Not Modify): Observation [10022]       B Medical/Surgery History Past Medical History:  Diagnosis Date  . Acute on chronic respiratory failure with hypoxia ([place])   . Anasarca 06/10/2016  . Asthma   . At high risk for severe sepsis   . Cocaine use 10/08/2013  . Decubitus ulcer of buttock, unstageable ([place])   . Depression   . Diabetes mellitus without complication ([place])   . GERD (gastroesophageal reflux disease)   . HCAP (healthcare-associated pneumonia) 06/09/2016  . Hepatitis    hx of hepatits frm mono   . Kidney stone   . Lobar pneumonia ([place])   . Overdose of opiate or related narcotic ([place]) 09/02/2014  . Pacemaker   . Pleurisy   . Polysubstance dependence including opioid type drug, episodic abuse ([place]) 10/27/2013  . Protein calorie malnutrition ([place]) 10/31/2015  . Quadriparesis ([place])   . Quadriplegia and quadriparesis ([place])   . Quadriplegia, C5-C7 incomplete ([place]) 10/08/2013  . Stage IV pressure ulcer of sacral region ([place]) 10/31/2015  . Tracheostomy status [facility])    Past Surgical History:  Procedure Laterality Date  . APPENDECTOMY    . BACK SURGERY  10/08/2013   fusion of spine, cervical region  . CARDIAC SURGERY    . CYSTOSCOPY W/ URETERAL STENT PLACEMENT Right 03/09/2017   Procedure: CYSTOSCOPY [name] STENT PLACEMENT;  Surgeon: Irine Seal, MD;  Location: [facility];  Service: Urology;  Laterality: Right;  . CYSTOSCOPY W/ URETERAL STENT REMOVAL Right 07/16/2017   Procedure: CYSTOSCOPY WITH  STENT REMOVAL;  Surgeon: Franchot Gallo, MD;  Location: [facility];  Service: Urology;  Laterality: Right;  . CYSTOSCOPY/URETEROSCOPY/HOLMIUM LASER/STENT PLACEMENT Right 07/16/2017   Procedure: CYSTOSCOPY/URETEROSCOPY/HOLMIUM LASER/STENT PLACEMENT/ALSO STONE EXTRACTION;  Surgeon: Franchot Gallo, MD;  Location: [facility];   Service: Urology;  Laterality: Right;  . GASTROSTOMY  11/23/2015  . I&D EXTREMITY  10/28/2011   Procedure: IRRIGATION AND DEBRIDEMENT EXTREMITY;  Surgeon: Tennis Must, MD;  Location: [facility];  Service: Orthopedics;  Laterality: Right;  Irrigation and debridement right middle finger  . INSERTION OF SUPRAPUBIC CATHETER N/A 07/28/2016   Procedure: INSERTION OF SUPRAPUBIC CATHETER;  Surgeon: Franchot Gallo, MD;  Location: [facility];  Service: Urology;  Laterality: N/A;  . IR GENERIC HISTORICAL  11/23/2015   IR GASTROSTOMY TUBE MOD SED 11/23/2015 [facility]-INTER[facility] RAD  . IR PATIENT EVAL TECH 0-60 MINS  12/18/2017  . IR REPLACE G-TUBE SIMPLE WO FLUORO  12/19/2017  . LITHOTRIPSY  08/2017  . TRACHEOSTOMY       A IV Location/Drains/Wounds Patient Lines/Drains/Airways Status   Active Line/Drains/Airways    Name:   Placement date:   Placement time:   Site:   Days:   Peripheral IV 07/20/18 Left Hand   07/20/18    -    Hand   less than 1   Peripheral IV 07/20/18 Left;Lateral Hand   07/20/18    0134    Hand   less than 1   Suprapubic Catheter   07/20/18    -    -   less than 1   Tracheostomy   07/20/18    -    -   less than 1   Tracheostomy [place] 6 mm Uncuffed   07/20/18    0130    6 mm   less than 1   Pressure Injury 07/20/18 Sacrum   07/20/18    0146     less than 1          Intake/Output Last 24 hours No intake or output data in the 24 hours ending 07/20/18 0428  Labs/Imaging Results for orders placed or performed during the hospital encounter of 07/20/18 (from the past 48 hour(s))  CBC with Differential/Platelet     Status: Abnormal   Collection Time: 07/20/18 12:45 AM  Result Value Ref Range   WBC 11.4 (H) 4.0 - 10.5 K/uL   RBC 4.21 3.87 - 5.11 MIL/uL   Hemoglobin 11.9 (L) 12.0 - 15.0 g/dL   HCT 38.5 36.0 - 46.0 %   MCV 91.4 80.0 - 100.0 fL   MCH 28.3 26.0 - 34.0 pg   MCHC 30.9 30.0 - 36.0 g/dL   RDW 15.1 11.5 - 15.5 %   Platelets 403 (H) 150 - 400 K/uL   nRBC 0.0 0.0  - 0.2 %   Neutrophils Relative % 78 %   Neutro Abs 8.8 (H) 1.7 - 7.7 K/uL   Lymphocytes Relative 14 %   Lymphs Abs 1.6 0.7 - 4.0 K/uL   Monocytes Relative 5 %   Monocytes Absolute 0.6 0.1 - 1.0 K/uL   Eosinophils Relative 3 %   Eosinophils Absolute 0.4 0.0 - 0.5 K/uL   Basophils Relative 0 %   Basophils Absolute 0.1 0.0 - 0.1 K/uL   Immature Granulocytes 0 %   Abs Immature Granulocytes 0.04 0.00 - 0.07 K/uL    Comment: Performed at [facility], [address], [place], [place] [address]  Comprehensive metabolic panel     Status: Abnormal  Collection Time: 07/20/18 12:45 AM  Result Value Ref Range   Sodium 135 135 - 145 mmol/L   Potassium 3.9 3.5 - 5.1 mmol/L   Chloride 97 (L) 98 - 111 mmol/L   CO2 25 22 - 32 mmol/L   Glucose, Bld 200 (H) 70 - 99 mg/dL   BUN 26 (H) 6 - 20 mg/dL   Creatinine, Ser 0.53 0.44 - 1.00 mg/dL   Calcium 10.6 (H) 8.9 - 10.3 mg/dL   Total Protein 8.2 (H) 6.5 - 8.1 g/dL   Albumin 3.3 (L) 3.5 - 5.0 g/dL   AST 17 15 - 41 U/L   ALT 29 0 - 44 U/L   Alkaline Phosphatase 166 (H) 38 - 126 U/L   Total Bilirubin 0.2 (L) 0.3 - 1.2 mg/dL   GFR calc non Af Amer >60 >60 mL/min   GFR calc Af Amer >60 >60 mL/min   Anion gap 13 5 - 15    Comment: Performed at [facility], [place] [address] [place], [place] [address]  Urinalysis, Routine w reflex microscopic     Status: Abnormal   Collection Time: 07/20/18 12:45 AM  Result Value Ref Range   Color, Urine YELLOW YELLOW   APPearance TURBID (A) CLEAR   Specific Gravity, Urine 1.012 1.005 - 1.030   pH 9.0 (H) 5.0 - 8.0   Glucose, UA 50 (A) NEGATIVE mg/dL   Hgb urine dipstick NEGATIVE NEGATIVE   Bilirubin Urine NEGATIVE NEGATIVE   Ketones, ur NEGATIVE NEGATIVE mg/dL   Protein, ur >=300 (A) NEGATIVE mg/dL   Nitrite NEGATIVE NEGATIVE   Leukocytes,Ua LARGE (A) NEGATIVE   RBC / HPF 0-5 0 - 5 RBC/hpf   WBC, UA 6-10 0 - 5 WBC/hpf   Bacteria, UA MANY (A) NONE SEEN   Squamous Epithelial / LPF 0-5 0 - 5   Triple  Phosphate Crystal PRESENT     Comment: Performed at [facility], [place] [address], [place], [place] [address]  Lactic acid, plasma     Status: None   Collection Time: 07/20/18 12:45 AM  Result Value Ref Range   Lactic Acid, Venous 1.2 0.5 - 1.9 mmol/L    Comment: Performed at [facility] [address], [place], [place] [address]  SARS Coronavirus 2 (CEPHEID- Performed in [facility] hospital lab), Hosp Order     Status: None   Collection Time: 07/20/18 12:45 AM   Specimen: Nasopharyngeal Swab  Result Value Ref Range   SARS Coronavirus 2 NEGATIVE NEGATIVE    Comment: (NOTE) If result is NEGATIVE SARS-CoV-2 target nucleic acids are NOT DETECTED. The SARS-CoV-2 RNA is generally detectable in upper and lower  respiratory specimens during the acute phase of infection. The lowest  concentration of SARS-CoV-2 viral copies this assay can detect is 250  copies / mL. A negative result does not preclude SARS-CoV-2 infection  and should not be used as the sole basis for treatment or other  patient management decisions.  A negative result may occur with  improper specimen collection / handling, submission of specimen other  than nasopharyngeal swab, presence of viral mutation(s) within the  areas targeted by this assay, and inadequate number of viral copies  (<250 copies / mL). A negative result must be combined with clinical  observations, patient history, and epidemiological information. If result is POSITIVE SARS-CoV-2 target nucleic acids are DETECTED. The SARS-CoV-2 RNA is generally detectable in upper and lower  respiratory specimens dur ing the acute phase of  infection.  Positive  results are indicative of active infection with SARS-CoV-2.  Clinical  correlation with patient history and other diagnostic information is  necessary to determine patient infection status.  Positive results do  not rule out bacterial infection or co-infection with other viruses. If result is  PRESUMPTIVE POSTIVE SARS-CoV-2 nucleic acids MAY BE PRESENT.   A presumptive positive result was obtained on the submitted specimen  and confirmed on repeat testing.  While 2019 novel coronavirus  (SARS-CoV-2) nucleic acids may be present in the submitted sample  additional confirmatory testing may be necessary for epidemiological  and / or clinical management purposes  to differentiate between  SARS-CoV-2 and other Sarbecovirus currently known to infect humans.  If clinically indicated additional testing with an alternate test  methodology [phone]) is advised. The SARS-CoV-2 RNA is generally  detectable in upper and lower respiratory sp ecimens during the acute  phase of infection. The expected result is Negative. Fact Sheet for Patients:  StrictlyIdeas.no Fact Sheet for Healthcare Providers: BankingDealers.co.za This test is not yet approved or cleared by the [place] FDA and has been authorized for detection and/or diagnosis of SARS-CoV-2 by FDA under an Emergency Use Authorization (EUA).  This EUA will remain in effect (meaning this test can be used) for the duration of the COVID-19 declaration under Section 564(b)(1) of the Act, 21 U.S.C. section 360bbb-3(b)(1), unless the authorization is terminated or revoked sooner. Performed at [facility], [place] [address], [place], [place] [address]   POCT I-Stat EG7     Status: Abnormal   Collection Time: 07/20/18 12:56 AM  Result Value Ref Range   pH, Ven 7.412 7.250 - 7.430   pCO2, Ven 48.8 44.0 - 60.0 mmHg   pO2, Ven 58.0 (H) 32.0 - 45.0 mmHg   Bicarbonate 31.4 (H) 20.0 - 28.0 mmol/L   TCO2 33 (H) 22 - 32 mmol/L   O2 Saturation 91.0 %   Acid-Base Excess 5.0 (H) 0.0 - 2.0 mmol/L   Sodium 141 135 - 145 mmol/L   Potassium 4.1 3.5 - 5.1 mmol/L   Calcium, Ion 1.41 (H) 1.15 - 1.40 mmol/L   HCT 38.0 36.0 - 46.0 %   Hemoglobin 12.9 12.0 - 15.0 g/dL   Patient temperature 96.6 F     Collection site IV START    Drawn by Nurse    Sample type VENOUS    Ct Head Wo Contrast  Result Date: 07/20/2018 CLINICAL DATA:  Altered mental status. EXAM: CT HEAD WITHOUT CONTRAST TECHNIQUE: Contiguous axial images were obtained from the base of the skull through the vertex without intravenous contrast. COMPARISON:  08/22/2018 FINDINGS: Brain: No acute intracranial abnormality. Specifically, no hemorrhage, hydrocephalus, mass lesion, acute infarction, or significant intracranial injury. Vascular: No hyperdense vessel or unexpected calcification. Skull: No acute calvarial abnormality. Sinuses/Orbits: Visualized paranasal sinuses and mastoids clear. Orbital soft tissues unremarkable. Other: None IMPRESSION: Normal study. Electronically Signed   By: Rolm Baptise M.D.   On: 07/20/2018 01:23   Dg Chest Port 1 View  Result Date: 07/20/2018 CLINICAL DATA:  Altered mental status. EXAM: PORTABLE CHEST 1 VIEW COMPARISON:  Choose second 2020 FINDINGS: Tracheostomy tube terminates above the carina. A dual chamber left-sided pacemaker is noted. Heart size is stable from prior studies. There is no pneumothorax. No large pleural effusion. There are bibasilar airspace opacities which may represent atelectasis or developing infiltrate. There is stable blunting of the right costophrenic angle. There appears to be a gastrostomy tube projects over the abdomen.  IMPRESSION: No definite acute cardiopulmonary process. Electronically Signed   By: Constance Holster M.D.   On: 07/20/2018 01:09    Pending Labs Unresulted Labs (From admission, onward)    Start     Ordered   07/20/18 0051  Lactic acid, plasma  Now then every 2 hours,   STAT     07/20/18 0052   07/20/18 0049  Urine Culture  Once,   STAT     07/20/18 0049   07/20/18 0049  Culture, blood (Routine X 2) w Reflex to ID Panel  BLOOD CULTURE X 2,   R (with STAT occurrences)     07/20/18 0049          Vitals/Pain Today's Vitals   07/20/18 0230 07/20/18  0233 07/20/18 0300 07/20/18 0400  BP: (!) 160/92  (!) 154/92 133/83  Pulse: 91  95 80  Resp: 19  18 16   Temp:      TempSrc:      SpO2: 97% 99% 95% 94%  Weight:        Isolation Precautions Airborne and Contact precautions  Medications Medications  vancomycin (VANCOCIN) IVPB 750 mg/150 ml premix (has no administration in time range)  ceFEPIme (MAXIPIME) 1 g in sodium chloride 0.9 % 100 mL IVPB (has no administration in time range)  metroNIDAZOLE (FLAGYL) IVPB 500 mg (0 mg Intravenous Stopped 07/20/18 0253)  vancomycin (VANCOCIN) IVPB 1000 mg/200 mL premix (0 mg Intravenous Stopped 07/20/18 0253)  ceFEPIme (MAXIPIME) 2 g in sodium chloride 0.9 % 100 mL IVPB (0 g Intravenous Stopped 07/20/18 0230)    Mobility non-ambulatory High fall risk   Focused Assessments Cardiac Assessment Handoff:  Cardiac Rhythm: Normal sinus rhythm Lab Results  Component Value Date   TROPONINI <0.03 06/10/2016   Lab Results  Component Value Date   DDIMER 0.74 (H) 01/19/2016   Does the Patient currently have chest pain? No  , Neuro Assessment Handoff:  Cardiac Rhythm: Normal sinus rhythm       Neuro Assessment: Exceptions to [facility] Neuro Checks: Nonverbal, does not follow commands,      R Recommendations: See Admitting Provider Note  Report given to:   Additional Notes: Quadraplegic, nonverbal but normally AXOx4 from previous charts, suprapubic catheter and PEG in place

## 2018-07-20 NOTE — ED Provider Notes (Signed)
[facility] PROGRESSIVE CARE Provider Note   CSN: [id number] Arrival date & time: 07/20/18  0025    History   Chief Complaint Chief Complaint  Patient presents with  . Altered Mental Status    HPI Angela Page is a 48 y.o. female.     Patient presents for evaluation of altered mental status.  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  She currently resides in a nursing home.  Patient has been noted to be confused and agitated.  She is reportedly normally awake, alert and oriented, able to hold a conversation.  At arrival to the ER she is nonverbal.Level V Caveat due to mental status change.     Past Medical History:  Diagnosis Date  . Acute on chronic respiratory failure with hypoxia ([place])   . Anasarca 06/10/2016  . Asthma   . At high risk for severe sepsis   . Cocaine use 10/08/2013  . Decubitus ulcer of buttock, unstageable ([place])   . Depression   . Diabetes mellitus without complication ([place])   . GERD (gastroesophageal reflux disease)   . HCAP (healthcare-associated pneumonia) 06/09/2016  . Hepatitis    hx of hepatits frm mono   . Kidney stone   . Lobar pneumonia ([place])   . Overdose of opiate or related narcotic ([place]) 09/02/2014  . Pacemaker   . Pleurisy   . Polysubstance dependence including opioid type drug, episodic abuse ([place]) 10/27/2013  . Protein calorie malnutrition ([place]) 10/31/2015  . Quadriparesis ([place])   . Quadriplegia and quadriparesis ([place])   . Quadriplegia, C5-C7 incomplete ([place]) 10/08/2013  . Stage IV pressure ulcer of sacral region ([place]) 10/31/2015  . Tracheostomy status [facility])     Patient Active Problem List   Diagnosis Date Noted  . Acute encephalopathy 07/20/2018  . Hypercalcemia 06/23/2018  . Thrombocytosis ([place]) 06/22/2018  . Chronic respiratory failure with hypoxia ([place])   . Acute on chronic respiratory failure ([place])   . Quadriplegia and quadriparesis ([place])   . At high risk for severe  sepsis   . Decubitus ulcer of buttock, unstageable ([place])   . Lactic acid acidosis 12/16/2017  . Suprapubic catheter ([place]) 12/16/2017  . Pressure injury of skin 07/17/2017  . Kidney stone 07/16/2017  . Right ureteral stone 03/10/2017  . Complicated UTI (urinary tract infection) 03/09/2017  . Ineffective airway clearance   . Sepsis secondary to UTI ([place]) 06/30/2016  . Autonomic neuropathy 06/10/2016  . Presence of permanent cardiac pacemaker 06/10/2016  . 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 Chronically on opiate therapy 06/10/2016  . PEG (percutaneous endoscopic gastrostomy) status ([place]) 06/10/2016  . Pulmonary hypertension ([place]) 06/10/2016  . Hypoalbuminemia 06/10/2016  . Peripheral edema   . Tracheostomy dependence ([place])   . Chronic obstructive pulmonary disease ([place])   . Chronic diastolic CHF (congestive heart failure) ([place]) 01/23/2016  . Tracheostomy status ([place])   . Esophageal dysphagia   . Malnutrition of moderate degree 11/01/2015  . Tobacco abuse 10/31/2015  . Asthma 10/31/2015  . History of pneumonia 10/08/2015  . Pressure ulcer of contiguous region involving buttock and hip, stage 4 ([place]) 02/26/2015  . Normocytic normochromic anemia 02/25/2015  . Acute metabolic encephalopathy 75/64/3329  . Leukocytosis 10/23/2014  . Hypokalemia 10/23/2014  . Decubitus ulcer of sacral region, stage 4 ([place]) 10/23/2014  . Anemia, iron deficiency 10/23/2014  . Quadriplegia, C5-C7 incomplete ([place]) 10/22/2014  . Depression   . Protein-calorie malnutrition, severe ([place]) 09/05/2014  . 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 Recurrent UTI 03/31/2014  . Pressure ulcer of coccygeal region, stage 4 ([place]) 01/06/2014  . Stage 4 skin ulcer of sacral region ([place]) 01/06/2014  . Neuropathic pain 11/30/2013  . 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 Spinal cord injury, cervical region ([place]) 11/30/2013  . Chronic pain due to injury 11/30/2013  .  S/P cervical spinal fusion 10/27/2013  . Tracheostomy care ([place]) 10/27/2013  . 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 SCI (spinal cord injury) 10/11/2013    Past Surgical History:  Procedure Laterality Date  . APPENDECTOMY    . BACK SURGERY  10/08/2013   fusion of spine, cervical region  . CARDIAC SURGERY    . CYSTOSCOPY W/ URETERAL STENT PLACEMENT Right 03/09/2017   Procedure: CYSTOSCOPY [name] STENT PLACEMENT;  Surgeon: Irine Seal, MD;  Location: [facility];  Service: Urology;  Laterality: Right;  . CYSTOSCOPY W/ URETERAL STENT REMOVAL Right 07/16/2017   Procedure: CYSTOSCOPY WITH STENT REMOVAL;  Surgeon: Franchot Gallo, MD;  Location: [facility];  Service: Urology;  Laterality: Right;  . CYSTOSCOPY/URETEROSCOPY/HOLMIUM LASER/STENT PLACEMENT Right 07/16/2017   Procedure: CYSTOSCOPY/URETEROSCOPY/HOLMIUM LASER/STENT PLACEMENT/ALSO STONE EXTRACTION;  Surgeon: Franchot Gallo, MD;  Location: [facility];  Service: Urology;  Laterality: Right;  . GASTROSTOMY  11/23/2015  . I&D EXTREMITY  10/28/2011   Procedure: IRRIGATION AND DEBRIDEMENT EXTREMITY;  Surgeon: Tennis Must, MD;  Location: [facility];  Service: Orthopedics;  Laterality: Right;  Irrigation and debridement right middle finger  . INSERTION OF SUPRAPUBIC CATHETER N/A 07/28/2016   Procedure: INSERTION OF SUPRAPUBIC CATHETER;  Surgeon: Franchot Gallo, MD;  Location: [facility];  Service: Urology;  Laterality: N/A;  . IR GENERIC HISTORICAL  11/23/2015   IR GASTROSTOMY TUBE MOD SED 11/23/2015 [facility]  . IR PATIENT EVAL TECH 0-60 MINS  12/18/2017  . IR REPLACE G-TUBE SIMPLE WO FLUORO  12/19/2017  . LITHOTRIPSY  08/2017  . TRACHEOSTOMY       OB History   No obstetric history on file.      Home Medications    Prior to Admission medications   Medication Sig Start Date End Date Taking? Authorizing Provider  buPROPion (WELLBUTRIN SR) 150 MG 12 hr tablet Take 150 mg by mouth daily.    [provider]   carbamide peroxide (DEBROX) 6.5 % OTIC solution Place 5 drops into both ears 2 (two) times daily. 06/27/18   Georgette Shell, MD  Dulaglutide (TRULICITY) 1.5 VX/7.9TJ SOPN Inject 1.5 mg into the skin every Friday.    [provider]  DULoxetine (CYMBALTA) 30 MG capsule Take 1 capsule (30 mg total) by mouth 2 (two) times daily. 02/17/18   Danford, Suann Larry, MD  famotidine (PEPCID) 40 MG/5ML suspension Place 2.5 mLs (20 mg total) into feeding tube at bedtime. 12/31/17   Arrien, Jimmy Picket, MD  ferrous sulfate 300 (60 Fe) MG/5ML syrup Place 3.7 mLs (220 mg total) into feeding tube daily with breakfast. 04/11/18   Kayleen Memos, DO  gabapentin (NEURONTIN) 300 MG capsule Take 300 mg by mouth 3 (three) times daily.    [provider]  heparin 5000 UNIT/ML injection Inject 5,000 Units into the skin every 8 (eight) hours.    [provider]  insulin aspart (NOVOLOG) 100 UNIT/ML injection Inject 10 Units into the skin every 4 (four) hours. For glucose 150 to 200 use 1 units, for glucose 201-250 use 2 units, for 251 to 300 use 4 units, for 301 to 350 use  6 units, for 351 to 400 use 8 units, for 401 or greater use 10 units. Patient taking differently: Inject 0-10 Units into the skin See admin instructions. Check FSBS every 6 hours. Give insulin according to sliding scale: If BS is 0-150 = 0u, 151-200 = 2u, 201-250 = 4u, 251-300 = 6u, 301-350 = 8u, 351-400 = 10u. >400 call MD 12/31/17   Arrien, Jimmy Picket, MD  Insulin Glargine (BASAGLAR KWIKPEN) 100 UNIT/ML SOPN Inject 0.05 mLs (5 Units total) into the skin at bedtime. 04/11/18   Kayleen Memos, DO  lip balm (CARMEX) ointment Apply topically as needed for lip care. 06/27/18   Georgette Shell, MD  LORazepam (ATIVAN) 0.5 MG tablet Take 1 tablet (0.5 mg total) by mouth every 8 (eight) hours as needed for anxiety. Patient taking differently: Take 0.5 mg by mouth daily at 2 PM.  02/18/18   Danford, Suann Larry, MD   metoCLOPramide (REGLAN) 5 MG tablet Take 5 mg by mouth 4 (four) times daily.     [provider]  midodrine (PROAMATINE) 10 MG tablet Take 10 mg by mouth 3 (three) times daily.    [provider]  Nutritional Supplements (FEEDING SUPPLEMENT, GLUCERNA 1.2 CAL,) LIQD Place 1,000 mLs into feeding tube daily. Patient taking differently: Place 1,000 mLs into feeding tube See admin instructions. On at 1400 and off at 1000 02/18/18   Danford, Suann Larry, MD  Nutritional Supplements (PROMOD) LIQD Take 30 mLs by mouth 3 (three) times daily.    [provider]  OXYGEN Inhale 3 L into the lungs continuous.    [provider]  polyethylene glycol (MIRALAX / GLYCOLAX) packet Take 17 g by mouth daily. Hold if diarrhea. 01/01/18   Arrien, Jimmy Picket, MD  tiotropium (SPIRIVA) 18 MCG inhalation capsule Place 18 mcg into inhaler and inhale daily.    [provider]  zolpidem (AMBIEN) 5 MG tablet Take 2.5 mg by mouth at bedtime.    [provider]    Family History Family History  Problem Relation Age of Onset  . Hypertension Maternal Uncle   . Kidney failure Maternal Uncle   . Colon cancer Mother   . Lung cancer Father   . Hypertension Brother   . Kidney failure Maternal Aunt   . Kidney failure Maternal Uncle   . Colon cancer Maternal Grandfather   . Esophageal cancer Neg Hx     Social History Social History   Tobacco Use  . Smoking status: Current Every Day Smoker    Packs/day: 1.00    Years: 25.00    Pack years: 25.00    Types: Cigarettes  . Smokeless tobacco: Never Used  Substance Use Topics  . Alcohol use: Never    Frequency: Never  . Drug use: No     Allergies   Penicillins, Erythromycin, Nitrofurantoin monohyd macro, and Oxybutynin   Review of Systems Review of Systems  Unable to perform ROS: Mental status change     Physical Exam Updated Vital Signs BP (!) 151/121 (BP Location: Right Arm)   Pulse (!) 107   Temp  98.9 F (37.2 C) (Oral)   Resp 20   Wt 45.6 kg   LMP 05/27/2016 (Within Days) Comment: Irregular periods since October 2015.  SpO2 95%   BMI 18.39 kg/m   Physical Exam Constitutional:      Appearance: She is ill-appearing.  HENT:     Head: Atraumatic.     Mouth/Throat:     Mouth: Mucous membranes  are moist.  Neck:     Musculoskeletal: Normal range of motion.  Cardiovascular:     Rate and Rhythm: Regular rhythm. Tachycardia present.  Pulmonary:     Effort: Pulmonary effort is normal.     Breath sounds: Normal breath sounds.  Abdominal:     Palpations: Abdomen is soft.  Musculoskeletal:     Comments: Chronic contractions of extremities present  Skin:    General: Skin is warm and dry.     Comments: Chronic decubital right buttock with granulation tissue, no drainage or signs of active infection  Neurological:     Comments: Noncommunicative      ED Treatments / Results  Labs (all labs ordered are listed, but only abnormal results are displayed) Labs Reviewed  CBC WITH DIFFERENTIAL/PLATELET - Abnormal; Notable for the following components:      Result Value   WBC 11.4 (*)    Hemoglobin 11.9 (*)    Platelets 403 (*)    Neutro Abs 8.8 (*)    All other components within normal limits  COMPREHENSIVE METABOLIC PANEL - Abnormal; Notable for the following components:   Chloride 97 (*)    Glucose, Bld 200 (*)    BUN 26 (*)    Calcium 10.6 (*)    Total Protein 8.2 (*)    Albumin 3.3 (*)    Alkaline Phosphatase 166 (*)    Total Bilirubin 0.2 (*)    All other components within normal limits  URINALYSIS, ROUTINE W REFLEX MICROSCOPIC - Abnormal; Notable for the following components:   APPearance TURBID (*)    pH 9.0 (*)    Glucose, UA 50 (*)    Protein, ur >=300 (*)    Leukocytes,Ua LARGE (*)    Bacteria, UA MANY (*)    All other components within normal limits  POCT I-STAT EG7 - Abnormal; Notable for the following components:   pO2, Ven 58.0 (*)    Bicarbonate 31.4  (*)    TCO2 33 (*)    Acid-Base Excess 5.0 (*)    Calcium, Ion 1.41 (*)    All other components within normal limits  SARS CORONAVIRUS 2 (HOSPITAL ORDER, [facility])  URINE CULTURE  CULTURE, BLOOD (ROUTINE X 2)  CULTURE, BLOOD (ROUTINE X 2)  LACTIC ACID, PLASMA  LACTIC ACID, PLASMA  I-STAT VENOUS BLOOD GAS, ED    EKG EKG Interpretation  Date/Time:  Tuesday July 20 2018 00:28:44 EDT Ventricular Rate:  80 PR Interval:    QRS Duration: 86 QT Interval:  416 QTC Calculation: 480 R Axis:   41 Text Interpretation:  Sinus rhythm Left ventricular hypertrophy ST elev, probable normal early repol pattern No significant change since last tracing Confirmed by Orpah Greek [phone]) on 07/20/2018 2:24:56 AM   Radiology Ct Head Wo Contrast  Result Date: 07/20/2018 CLINICAL DATA:  Altered mental status. EXAM: CT HEAD WITHOUT CONTRAST TECHNIQUE: Contiguous axial images were obtained from the base of the skull through the vertex without intravenous contrast. COMPARISON:  08/22/2018 FINDINGS: Brain: No acute intracranial abnormality. Specifically, no hemorrhage, hydrocephalus, mass lesion, acute infarction, or significant intracranial injury. Vascular: No hyperdense vessel or unexpected calcification. Skull: No acute calvarial abnormality. Sinuses/Orbits: Visualized paranasal sinuses and mastoids clear. Orbital soft tissues unremarkable. Other: None IMPRESSION: Normal study. Electronically Signed   By: Rolm Baptise M.D.   On: 07/20/2018 01:23   Dg Chest Port 1 View  Result Date: 07/20/2018 CLINICAL DATA:  Altered mental status. EXAM: PORTABLE CHEST 1  VIEW COMPARISON:  Choose second 2020 FINDINGS: Tracheostomy tube terminates above the carina. A dual chamber left-sided pacemaker is noted. Heart size is stable from prior studies. There is no pneumothorax. No large pleural effusion. There are bibasilar airspace opacities which may represent atelectasis or developing  infiltrate. There is stable blunting of the right costophrenic angle. There appears to be a gastrostomy tube projects over the abdomen. IMPRESSION: No definite acute cardiopulmonary process. Electronically Signed   By: Constance Holster M.D.   On: 07/20/2018 01:09    Procedures Procedures (including critical care time)  Medications Ordered in ED Medications  vancomycin (VANCOCIN) IVPB 750 mg/150 ml premix (has no administration in time range)  ceFEPIme (MAXIPIME) 1 g in sodium chloride 0.9 % 100 mL IVPB (has no administration in time range)  metroNIDAZOLE (FLAGYL) IVPB 500 mg (0 mg Intravenous Stopped 07/20/18 0253)  vancomycin (VANCOCIN) IVPB 1000 mg/200 mL premix (0 mg Intravenous Stopped 07/20/18 0253)  ceFEPIme (MAXIPIME) 2 g in sodium chloride 0.9 % 100 mL IVPB (0 g Intravenous Stopped 07/20/18 0230)     Initial Impression / Assessment and Plan / ED Course  I have reviewed the triage vital signs and the nursing notes.  Pertinent labs & imaging results that were available during my care of the patient were reviewed by me and considered in my medical decision making (see chart for details).        Patient arrives to the emergency department with acute mental status changes.  Reviewing her records reveals that she has had frequent presentations with similar mental status changes when she has an infection.  Her frequent infections are urine but also has had pneumonia in the past.  Her chest x-ray is clear and she is breathing without any distress.  Oxygenation is normal.  She does not have any significant airway secretions.  Remainder of her blood work was essentially normal but urine does suggest possible infection.  Patient was felt to possibly be septic at arrival based on her vital signs and mental status changes, as well as her previous history of sepsis.  She received broad-spectrum antibiotic coverage at arrival.  Cultures are pending.  She has been mildly hypertensive and does not  have a significantly elevated lactic acid, did not require IV fluid boluses as she does not have any sign of septic shock.  Final Clinical Impressions(s) / ED Diagnoses   Final diagnoses:  Altered mental status, unspecified altered mental status type    ED Discharge Orders    None       Orpah Greek, MD 07/20/18 [phone]

## 2018-07-20 NOTE — Progress Notes (Signed)
Pharmacy Antibiotic Note  Angela Page is a 48 y.o. female admitted on 07/20/2018 with sepsis.  Pharmacy has been consulted for cefepime (for aztreonam as has tolerated cephalosporins in the past) and vancomycin dosing.She had admission earlier this month and was on vancomycin and cefepime. Vancomycin 1gm ordered in the ED.    Plan: Cefepime 2 gm x 1 then 1gm IV q8 hours Continue vancomycin as previously ordered 750 mg IV q24 hours F/u cultures and clinical course     Temp (24hrs), Avg:96.3 F (35.7 C), Min:96.3 F (35.7 C), Max:96.3 F (35.7 C)  Recent Labs  Lab 07/20/18 0045  WBC 11.4*    CrCl cannot be calculated (Patient's most recent lab result is older than the maximum 21 days allowed.).    Allergies  Allergen Reactions  . Penicillins Hives    Has patient had a PCN reaction causing immediate rash, facial/tongue/throat swelling, SOB or lightheadedness with hypotension: Yes Has patient had a PCN reaction causing severe rash involving mucus membranes or skin necrosis: Yes Has patient had a PCN reaction that required hospitalization No Has patient had a PCN reaction occurring within the last 10 years: No-childhood allergy If all of the above answers are "NO", then may proceed with Cephalosporin  Tolerated cephalosporins in past  . Erythromycin Nausea And Vomiting  . Nitrofurantoin Monohyd Macro Nausea And Vomiting  . Oxybutynin     Dries mouth out     Thank you for allowing pharmacy to be a part of this patient's care.  Excell Seltzer Poteet 07/20/2018 1:21 AM

## 2018-07-20 NOTE — Consult Note (Signed)
[facility] Nurse wound consult note Reason for Consult:Chronic nonhealing stage 4 pressureinjury to sacrum and bilateral ischial tuberosities.  Paraplegia and acute sepsis from UTI (has SP cath) Wound type: Stage 4 pressure injury to sacrum and stage 3 to bilateral ischium.  Pressure Injury POA: Yes Measurement: SAcrum:  3 cmx 2.4 cm x 0.4 cm  wound bed is pale pink nongranulating with bone palpable.  Left ischium:  1 cmx 1 cm x 0.2 cm  Right ischium:  0.5 cm round lesion Wound bed:see above Drainage (amount, consistency, odor) moderate serosanguinous  Periwound:intact skin Dressing procedure/placement/frequency:  Cleanse wounds to sacrum and bilateral ischium with NS and pat dry.  APPLY Aquacel Ag to each wound bed and cover with silicone foam  Change Tues/Thurs/Sat and PRN soilage.  Will not follow at this time.  Please re-consult if needed.  Domenic Moras MSN, RN, FNP-BC CWON Wound, Ostomy, Continence Nurse Pager [phone]

## 2018-07-20 NOTE — Progress Notes (Signed)
Request to IR for evaluation of clogged PEG tube - per RN unable to flush this morning, unclear of last use as patient from SNF. This was the first time this RN attempted to use for medications.  PEG tube unclogged successfully at bedside by this writer using gentle aspiration/flushing with NS and 60 mL and two passes with scrub brush.    Please call IR with questions or concerns.  Candiss Norse, PA-C Pager# [phone]

## 2018-07-20 NOTE — ED Triage Notes (Signed)
Pt from [facility] for [facility]; unknown last known well. At baseline staff reported to EMS that pt was AXOx4, pt nonverbal at present, moans with movement. Catheter and trach in place. Catheter bag emptied pta.

## 2018-07-20 NOTE — H&P (Signed)
History and Physical    Angela Page KXF:[id number] DOB: [birth year] DOA: 07/20/2018  I have briefly reviewed the patient's prior medical records in [place]  PCP: Default, Provider, MD  Patient coming from: SNF  Chief Complaint: AMS  HPI: Angela Page is a 48 y.o. female with medical history significant of traumatic C-spine fracture resulting in quadriplegia, currently with trach, PEG, suprapubic catheter, also with history of COPD, chronic hypotension, IDDM, chronic pain, sick sinus syndrome status post pacemaker, chronic decubitus ulcers who was brought to the hospital from her SNF on 6/30 due to increased confusion and agitation.  Patient currently is confused and cannot contribute to the story, per notes at baseline she is awake, alert and oriented, and able to hold a conversation.  ED Course: In the ED she is afebrile, tachycardic up to 120s, normotensive.  She is satting 95% on 10 L via trach.  Blood work shows slightly elevated white count of 11.4, normal renal function, normal lactic acid.  Urinalysis with evidence of UTI. Chest x-ray and CT scan of the head were unremarkable without acute findings, images personally reviewed.  Given concern for sepsis from UTI she was given broad-spectrum antibiotics and we are asked to admit  Review of Systems: Unable to obtain review of systems due to altered mental status  Past Medical History:  Diagnosis Date   Acute on chronic respiratory failure with hypoxia ([place])    Anasarca 06/10/2016   Asthma    At high risk for severe sepsis    Cocaine use 10/08/2013   Decubitus ulcer of buttock, unstageable ([place])    Depression    Diabetes mellitus without complication (HCC)    GERD (gastroesophageal reflux disease)    HCAP (healthcare-associated pneumonia) 06/09/2016   Hepatitis    hx of hepatits frm mono    Kidney stone    Lobar pneumonia (HCC)    Overdose of opiate or related narcotic ([place]) 09/02/2014    Pacemaker    Pleurisy    Polysubstance dependence including opioid type drug, episodic abuse ([place]) 10/27/2013   Protein calorie malnutrition ([place]) 10/31/2015   Quadriparesis ([place])    Quadriplegia and quadriparesis ([place])    Quadriplegia, C5-C7 incomplete ([place]) 10/08/2013   Stage IV pressure ulcer of sacral region ([place]) 10/31/2015   Tracheostomy status ([place])     Past Surgical History:  Procedure Laterality Date   APPENDECTOMY     BACK SURGERY  10/08/2013   fusion of spine, cervical region   [place] Right 03/09/2017   Procedure: CYSTOSCOPY [name] STENT PLACEMENT;  Surgeon: Irine Seal, MD;  Location: [facility];  Service: Urology;  Laterality: Right;   CYSTOSCOPY W/ URETERAL STENT REMOVAL Right 07/16/2017   Procedure: CYSTOSCOPY WITH STENT REMOVAL;  Surgeon: Franchot Gallo, MD;  Location: [facility];  Service: Urology;  Laterality: Right;   CYSTOSCOPY/URETEROSCOPY/HOLMIUM LASER/STENT PLACEMENT Right 07/16/2017   Procedure: CYSTOSCOPY/URETEROSCOPY/HOLMIUM LASER/STENT PLACEMENT/ALSO STONE EXTRACTION;  Surgeon: Franchot Gallo, MD;  Location: [facility];  Service: Urology;  Laterality: Right;   GASTROSTOMY  11/23/2015   I&D EXTREMITY  10/28/2011   Procedure: IRRIGATION AND DEBRIDEMENT EXTREMITY;  Surgeon: Tennis Must, MD;  Location: [facility];  Service: Orthopedics;  Laterality: Right;  Irrigation and debridement right middle finger   INSERTION OF SUPRAPUBIC CATHETER N/A 07/28/2016   Procedure: INSERTION OF SUPRAPUBIC CATHETER;  Surgeon: Franchot Gallo, MD;  Location: [facility];  Service: Urology;  Laterality: N/A;   IR  GENERIC HISTORICAL  11/23/2015   IR GASTROSTOMY TUBE MOD SED 11/23/2015 [facility]   IR PATIENT EVAL TECH 0-60 MINS  12/18/2017   IR REPLACE G-TUBE SIMPLE WO FLUORO  12/19/2017   LITHOTRIPSY  08/2017   TRACHEOSTOMY       reports that she has been smoking cigarettes. She has a 25.00  pack-year smoking history. She has never used smokeless tobacco. She reports that she does not drink alcohol or use drugs.  Allergies  Allergen Reactions   Penicillins Hives    Has patient had a PCN reaction causing immediate rash, facial/tongue/throat swelling, SOB or lightheadedness with hypotension: Yes Has patient had a PCN reaction causing severe rash involving mucus membranes or skin necrosis: Yes Has patient had a PCN reaction that required hospitalization No Has patient had a PCN reaction occurring within the last 10 years: No-childhood allergy If all of the above answers are "NO", then may proceed with Cephalosporin  Tolerated cephalosporins in past   Erythromycin Nausea And Vomiting   Nitrofurantoin Monohyd Macro Nausea And Vomiting   Oxybutynin     Dries mouth out     Family History  Problem Relation Age of Onset   Hypertension Maternal Uncle    Kidney failure Maternal Uncle    Colon cancer Mother    Lung cancer Father    Hypertension Brother    Kidney failure Maternal Aunt    Kidney failure Maternal Uncle    Colon cancer Maternal Grandfather    Esophageal cancer Neg Hx     Prior to Admission medications   Medication Sig Start Date End Date Taking? Authorizing Provider  buPROPion (WELLBUTRIN SR) 150 MG 12 hr tablet Take 150 mg by mouth daily.    [provider]  carbamide peroxide (DEBROX) 6.5 % OTIC solution Place 5 drops into both ears 2 (two) times daily. 06/27/18   Georgette Shell, MD  Dulaglutide (TRULICITY) 1.5 VH/8.4ON SOPN Inject 1.5 mg into the skin every Friday.    [provider]  DULoxetine (CYMBALTA) 30 MG capsule Take 1 capsule (30 mg total) by mouth 2 (two) times daily. 02/17/18   Danford, Suann Larry, MD  famotidine (PEPCID) 40 MG/5ML suspension Place 2.5 mLs (20 mg total) into feeding tube at bedtime. 12/31/17   Arrien, Jimmy Picket, MD  ferrous sulfate 300 (60 Fe) MG/5ML syrup Place 3.7 mLs (220 mg total) into  feeding tube daily with breakfast. 04/11/18   Kayleen Memos, DO  gabapentin (NEURONTIN) 300 MG capsule Take 300 mg by mouth 3 (three) times daily.    [provider]  heparin 5000 UNIT/ML injection Inject 5,000 Units into the skin every 8 (eight) hours.    [provider]  insulin aspart (NOVOLOG) 100 UNIT/ML injection Inject 10 Units into the skin every 4 (four) hours. For glucose 150 to 200 use 1 units, for glucose 201-250 use 2 units, for 251 to 300 use 4 units, for 301 to 350 use 6 units, for 351 to 400 use 8 units, for 401 or greater use 10 units. Patient taking differently: Inject 0-10 Units into the skin See admin instructions. Check FSBS every 6 hours. Give insulin according to sliding scale: If BS is 0-150 = 0u, 151-200 = 2u, 201-250 = 4u, 251-300 = 6u, 301-350 = 8u, 351-400 = 10u. >400 call MD 12/31/17   Arrien, Jimmy Picket, MD  Insulin Glargine (BASAGLAR KWIKPEN) 100 UNIT/ML SOPN Inject 0.05 mLs (5 Units total) into the skin at bedtime. 04/11/18   [place] Crane,  Lorenda Cahill, DO  lip balm (CARMEX) ointment Apply topically as needed for lip care. 06/27/18   Georgette Shell, MD  LORazepam (ATIVAN) 0.5 MG tablet Take 1 tablet (0.5 mg total) by mouth every 8 (eight) hours as needed for anxiety. Patient taking differently: Take 0.5 mg by mouth daily at 2 PM.  02/18/18   Danford, Suann Larry, MD  metoCLOPramide (REGLAN) 5 MG tablet Take 5 mg by mouth 4 (four) times daily.     [provider]  midodrine (PROAMATINE) 10 MG tablet Take 10 mg by mouth 3 (three) times daily.    [provider]  Nutritional Supplements (FEEDING SUPPLEMENT, GLUCERNA 1.2 CAL,) LIQD Place 1,000 mLs into feeding tube daily. Patient taking differently: Place 1,000 mLs into feeding tube See admin instructions. On at 1400 and off at 1000 02/18/18   Danford, Suann Larry, MD  Nutritional Supplements (PROMOD) LIQD Take 30 mLs by mouth 3 (three) times daily.    [provider]  OXYGEN Inhale  3 L into the lungs continuous.    [provider]  polyethylene glycol (MIRALAX / GLYCOLAX) packet Take 17 g by mouth daily. Hold if diarrhea. 01/01/18   Arrien, Jimmy Picket, MD  tiotropium (SPIRIVA) 18 MCG inhalation capsule Place 18 mcg into inhaler and inhale daily.    [provider]  zolpidem (AMBIEN) 5 MG tablet Take 2.5 mg by mouth at bedtime.    [provider]    Physical Exam: Vitals:   07/20/18 0430 07/20/18 0532 07/20/18 0718 07/20/18 0847  BP: 124/90 (!) 151/121  (!) 153/97  Pulse: 92 (!) 107 (!) 104 (!) 105  Resp: 20 20 20 19   Temp:  98.9 F (37.2 C)  98 F (36.7 C)  TempSrc:  Oral  Oral  SpO2: 96% 95% 95% 99%  Weight:  45.6 kg      Constitutional: Moaning in apparent pain, slightly agitated Eyes: PERRL, lids and conjunctivae normal, no scleral icterus ENMT: Mucous membranes are moist.  Neck: normal, supple, trach in place Respiratory: Diminished at the bases but no wheezing or crackles heard.  Slightly increased respiratory effort Cardiovascular: Regular rate and rhythm, no murmurs / rubs / gallops.  1+ foot edema. 2+ pedal pulses.  Abdomen: no tenderness, no masses palpated. Bowel sounds positive.  PEG tube present Skin: No rashes, sacral ulcers as below Neurologic: Contracted, does not follow commands Psychiatric: Appears anxious  Labs on Admission: I have personally reviewed following labs and imaging studies  CBC: Recent Labs  Lab 07/20/18 0045 07/20/18 0056  WBC 11.4*  --   NEUTROABS 8.8*  --   HGB 11.9* 12.9  HCT 38.5 38.0  MCV 91.4  --   PLT 403*  --    Basic Metabolic Panel: Recent Labs  Lab 07/20/18 0045 07/20/18 0056  NA 135 141  K 3.9 4.1  CL 97*  --   CO2 25  --   GLUCOSE 200*  --   BUN 26*  --   CREATININE 0.53  --   CALCIUM 10.6*  --    GFR: Estimated Creatinine Clearance: 62.6 mL/min (by C-G formula based on SCr of 0.53 mg/dL). Liver Function Tests: Recent Labs  Lab 07/20/18 0045  AST 17    ALT 29  ALKPHOS 166*  BILITOT 0.2*  PROT 8.2*  ALBUMIN 3.3*   No results for input(s): LIPASE, AMYLASE in the last 168 hours. No results for input(s): AMMONIA in the last 168 hours. Coagulation Profile: No results for input(s):  INR, PROTIME in the last 168 hours. Cardiac Enzymes: No results for input(s): CKTOTAL, CKMB, CKMBINDEX, TROPONINI in the last 168 hours. BNP (last 3 results) No results for input(s): PROBNP in the last 8760 hours. HbA1C: Recent Labs    07/20/18 0702  HGBA1C 7.1*   CBG: Recent Labs  Lab 07/20/18 0818  GLUCAP 181*   Lipid Profile: No results for input(s): CHOL, HDL, LDLCALC, TRIG, CHOLHDL, LDLDIRECT in the last 72 hours. Thyroid Function Tests: No results for input(s): TSH, T4TOTAL, FREET4, T3FREE, THYROIDAB in the last 72 hours. Anemia Panel: No results for input(s): VITAMINB12, FOLATE, FERRITIN, TIBC, IRON, RETICCTPCT in the last 72 hours. Urine analysis:    Component Value Date/Time   COLORURINE YELLOW 07/20/2018 0045   APPEARANCEUR TURBID (A) 07/20/2018 0045   LABSPEC 1.012 07/20/2018 0045   PHURINE 9.0 (H) 07/20/2018 0045   GLUCOSEU 50 (A) 07/20/2018 0045   GLUCOSEU NEGATIVE 04/28/2017 1121   HGBUR NEGATIVE 07/20/2018 0045   BILIRUBINUR NEGATIVE 07/20/2018 0045   KETONESUR NEGATIVE 07/20/2018 0045   PROTEINUR >=300 (A) 07/20/2018 0045   UROBILINOGEN 0.2 04/28/2017 1121   NITRITE NEGATIVE 07/20/2018 0045   LEUKOCYTESUR LARGE (A) 07/20/2018 0045     Radiological Exams on Admission: Ct Head Wo Contrast  Result Date: 07/20/2018 CLINICAL DATA:  Altered mental status. EXAM: CT HEAD WITHOUT CONTRAST TECHNIQUE: Contiguous axial images were obtained from the base of the skull through the vertex without intravenous contrast. COMPARISON:  08/22/2018 FINDINGS: Brain: No acute intracranial abnormality. Specifically, no hemorrhage, hydrocephalus, mass lesion, acute infarction, or significant intracranial injury. Vascular: No hyperdense vessel or  unexpected calcification. Skull: No acute calvarial abnormality. Sinuses/Orbits: Visualized paranasal sinuses and mastoids clear. Orbital soft tissues unremarkable. Other: None IMPRESSION: Normal study. Electronically Signed   By: Rolm Baptise M.D.   On: 07/20/2018 01:23   Dg Chest Port 1 View  Result Date: 07/20/2018 CLINICAL DATA:  Altered mental status. EXAM: PORTABLE CHEST 1 VIEW COMPARISON:  Choose second 2020 FINDINGS: Tracheostomy tube terminates above the carina. A dual chamber left-sided pacemaker is noted. Heart size is stable from prior studies. There is no pneumothorax. No large pleural effusion. There are bibasilar airspace opacities which may represent atelectasis or developing infiltrate. There is stable blunting of the right costophrenic angle. There appears to be a gastrostomy tube projects over the abdomen. IMPRESSION: No definite acute cardiopulmonary process. Electronically Signed   By: Constance Holster M.D.   On: 07/20/2018 01:09    EKG: Independently reviewed. Sinus rhythm  Assessment/Plan Active Problems:   Acute encephalopathy   Principal Problem Acute metabolic encephalopathy likely due to sepsis due to UTI due to chronic suprapubic catheter -Patient will be admitted to the hospital, placed on broad-spectrum IV antibiotics with vancomycin, cefepime metronidazole to have coverage for her wounds, blood cultures and urine cultures are pending. -Continue to closely monitor mental status  Patient with multiple microbiology data available from the past, including MRSA in the sputum at the beginning of this month as well as MRSA, Enterococcus faecalis & stenotrophomonas in the urine.  Cover broadly for now and tailor antibiotics based on current cultures.  Chest x-ray clear, no reports of increased secretions, did not send sputum cultures.  Active Problems Quadriplegia -With PEG, trach, supportive treatment, continue inhalers, will place on nebulizers as needed.    Diabetes mellitus -Place patient on every 4 CBGs and sliding scale due to tube feeds -Continue gabapentin  Chronic hypotension -Continue home midodrine  Pressure injury/ulcers -Chronic, prior to admission, she has  a stage IV on the lower sacrum, upper sacrum, left and right ischium, wound care consulted  PEG status -Continue tube feeds  Peripheral edema in the setting of hypoalbuminemia -dietary consult  COPD -No wheezing, stable, continue Incruse Ellipta place on albuterol as needed  Continue all her home medications including Wellbutrin, Cymbalta, iron supplements, Ativan and Reglan and MiraLAX   Scheduled Meds:  buPROPion  150 mg Oral Daily   DULoxetine  30 mg Oral BID   famotidine  20 mg Per Tube QHS   ferrous sulfate  220 mg Per Tube Q breakfast   gabapentin  300 mg Oral TID   heparin  5,000 Units Subcutaneous Q8H   insulin aspart  0-9 Units Subcutaneous Q4H   insulin glargine  5 Units Subcutaneous QHS   metoCLOPramide  5 mg Oral QID   midodrine  10 mg Oral TID WC   polyethylene glycol  17 g Oral Daily   umeclidinium bromide  1 puff Inhalation Daily   zolpidem  2.5 mg Oral QHS   Continuous Infusions:  ceFEPime (MAXIPIME) IV     metronidazole 500 mg (07/20/18 0815)   [START ON 07/21/2018] vancomycin     PRN Meds:.HYDROcodone-acetaminophen, lip balm, LORazepam   DVT prophylaxis: heparin  Code Status: Full code  Family Communication: none Disposition Plan: TBD Consults called: none     Marzetta Board, MD, PhD [facility]  Contact via [email or web address]  [facility] Office Info P: [phone] F: [phone]   07/20/2018, 9:20 AM

## 2018-07-20 NOTE — Progress Notes (Signed)
Initial Nutrition Assessment  RD working remotely.  DOCUMENTATION CODES:   Underweight  INTERVENTION:   Initiate Jevity 1.2 @ 55 ml/hr via PEG  30 ml Prostat daily.    125 ml free water flush every 6 hours  Tube feeding regimen provides 1684 kcal (100% of needs), 88 grams of protein, and 1565 ml of H2O.   -15 ml liquid MVI daily  NUTRITION DIAGNOSIS:   Increased nutrient needs related to wound healing as evidenced by estimated needs.  GOAL:   Patient will meet greater than or equal to 90% of their needs  MONITOR:   Diet advancement, Labs, Weight trends, TF tolerance, Skin, I & O's  REASON FOR ASSESSMENT:   Consult Assessment of nutrition requirement/status  ASSESSMENT:   Angela Page is a 48 y.o. female with medical history significant of traumatic C-spine fracture resulting in quadriplegia, currently with trach, PEG, suprapubic catheter, also with history of COPD, chronic hypotension, IDDM, chronic pain, sick sinus syndrome status post pacemaker, chronic decubitus ulcers who was brought to the hospital from her SNF on 6/30 due to increased confusion and agitation.  Patient currently is confused and cannot contribute to the story, per notes at baseline she is awake, alert and oriented, and able to hold a conversation.  Pt admitted with acute metabolic encephalopathy secondary to sepsis due to UTI from chronic subrapubic catheter.   6/30- PEG clogged, unclogged by IR  Reviewed I/O's: 0 ml since admission  Per H&P, pt is alert and oriented, however, unable to provide history at this time due to encephalopathy.   Pt familiar to clinical RD team due to multiple prior admissions. Pt resides at [facility] with chronic trach and PEG. PTA TF regimen Glucerna 1.2 @ 57 ml/hr x 20 hours (1400-1000) with Promod TID, which provides 1668 kcals and 98 grams protein, meeting 100% of estimated kcal and protein needs. Pt has consumed an oral diet in the past and was cleared for a  dysphagia 1 diet with thin liquids during last admission earlier this month. Now that pt is currently NPO will Ensure that pt is able to receive 100% of her nutrition needs via TF.   Reviewed wt hx; noted pt has experienced a 14.2% wt loss over the past 6 months, which is significant for time frame. Given multiple hospitalizations, chronic wounds, and weight loss, suspect pt with malnutrition, however, unable to identify at this time. Interpretation of nutrition-focused exam would be difficult, given pt's quadriplegia.   Per [facility] notes, pt with stage IV pressure injury to sacrum and stage 3 pressure injuries to bilateral ischial tuberosities.   PTA TF formula (Glucerna 1.2) is on back order per pharmacy and Glucerna 1.5 is in stock, but very limited supply. RD will change TF formula to Jevity 1.2, which is more readily available and also contains additional fiber to assist with glycemic control.   Received verbal permission to re-start TF from Dr. Cruzita Lederer via secure chat.   Lab Results  Component Value Date   HGBA1C 7.1 (H) 07/20/2018   PTA DM medications are 5 units insulin glargine q HS, 1.5 mg dulaglutide q Friday, 0-10 units insulin aspart every 6 hours.   Labs reviewed: CBGS: 172-181 (inpatient orders for glycemic control are 0-9 units insulin aspart every 4 hours and 5 units insulin glargine q HS).   Diet Order:   Diet Order            Diet NPO time specified  Diet effective now  EDUCATION NEEDS:   No education needs have been identified at this time  Skin:  Skin Assessment: Skin Integrity Issues: Skin Integrity Issues:: Stage IV, Stage III Stage III: rt and lt ischial tuberosities Stage IV: sacrum  Last BM:  07/20/18  Height:   Ht Readings from Last 1 Encounters:  06/22/18 5\' 2"  (1.575 m)    Weight:   Wt Readings from Last 1 Encounters:  07/20/18 45.6 kg    Ideal Body Weight:  43.8 kg(adjusted for quadriplegia)  BMI:  Body mass index is 18.39  kg/m.  Estimated Nutritional Needs:   Kcal:  1550-1750  Protein:  85-100 grams  Fluid:  > 1.5 L    Hudsyn Barich A. Jimmye Norman, RD, LDN, [place] Registered Dietitian II Certified Diabetes Care and Education Specialist Pager: [phone] After hours Pager: [phone]

## 2018-07-21 DIAGNOSIS — Z431 Encounter for attention to gastrostomy: Secondary | ICD-10-CM

## 2018-07-21 DIAGNOSIS — G9340 Encephalopathy, unspecified: Secondary | ICD-10-CM

## 2018-07-21 LAB — COMPREHENSIVE METABOLIC PANEL
ALT: 22 U/L (ref 0–44)
AST: 17 U/L (ref 15–41)
Albumin: 2.7 g/dL — ABNORMAL LOW (ref 3.5–5.0)
Alkaline Phosphatase: 130 U/L — ABNORMAL HIGH (ref 38–126)
Anion gap: 13 (ref 5–15)
BUN: 26 mg/dL — ABNORMAL HIGH (ref 6–20)
CO2: 21 mmol/L — ABNORMAL LOW (ref 22–32)
Calcium: 8.9 mg/dL (ref 8.9–10.3)
Chloride: 101 mmol/L (ref 98–111)
Creatinine, Ser: 0.65 mg/dL (ref 0.44–1.00)
GFR calc Af Amer: >60 mL/min (ref >60)
GFR calc non Af Amer: >60 mL/min (ref >60)
Glucose, Bld: 190 mg/dL — ABNORMAL HIGH (ref 70–99)
Potassium: 4.1 mmol/L (ref 3.5–5.1)
Sodium: 135 mmol/L (ref 135–145)
Total Bilirubin: 0.3 mg/dL (ref 0.3–1.2)
Total Protein: 6.8 g/dL (ref 6.5–8.1)

## 2018-07-21 LAB — GLUCOSE, CAPILLARY
Glucose-Capillary: 121 mg/dL — ABNORMAL HIGH (ref 70–99)
Glucose-Capillary: 205 mg/dL — ABNORMAL HIGH (ref 70–99)
Glucose-Capillary: 211 mg/dL — ABNORMAL HIGH (ref 70–99)
Glucose-Capillary: 221 mg/dL — ABNORMAL HIGH (ref 70–99)
Glucose-Capillary: 226 mg/dL — ABNORMAL HIGH (ref 70–99)
Glucose-Capillary: 246 mg/dL — ABNORMAL HIGH (ref 70–99)
Glucose-Capillary: 294 mg/dL — ABNORMAL HIGH (ref 70–99)

## 2018-07-21 LAB — CBC
HCT: 31.3 % — ABNORMAL LOW (ref 36.0–46.0)
Hemoglobin: 9.8 g/dL — ABNORMAL LOW (ref 12.0–15.0)
MCH: 28.2 pg (ref 26.0–34.0)
MCHC: 31.3 g/dL (ref 30.0–36.0)
MCV: 89.9 fL (ref 80.0–100.0)
Platelets: 321 10*3/uL (ref 150–400)
RBC: 3.48 MIL/uL — ABNORMAL LOW (ref 3.87–5.11)
RDW: 15.6 % — ABNORMAL HIGH (ref 11.5–15.5)
WBC: 13.7 10*3/uL — ABNORMAL HIGH (ref 4.0–10.5)
nRBC: 0 % (ref 0.0–0.2)

## 2018-07-21 MED ORDER — INSULIN GLARGINE 100 UNIT/ML ~~LOC~~ SOLN
10.0000 [IU] | Freq: Every day | SUBCUTANEOUS | Status: DC
  Administered 2018-07-21 – 2018-07-22 (×2): 10 [IU] via SUBCUTANEOUS
  Filled 2018-07-21 (×3): qty 0.1

## 2018-07-21 NOTE — Progress Notes (Signed)
SLP Cancellation Note  Patient Details Name: Angela Page MRN: [id number] DOB: 1970-07-05   Cancelled eval:   Orders received for PMV evaluation, cognitive evaluation.  Upon arrival to pt's room, she is found to be interactive, talkative; her PMV is in place and pt is tolerating it quite well.  No SLP needs identified - no eval needed.  Our service will sign off.  Esmeralda Blanford L. Tivis Ringer, [place] Office number [phone] Pager [phone]         Juan Quam Laurice 07/21/2018, 12:13 PM

## 2018-07-21 NOTE — Progress Notes (Signed)
Angela NOTE    Morningstar Page  MPN:[id number] DOB: 1970/05/25 DOA: 07/20/2018 PCP: Default, Provider, MD  Brief Narrative: 48 y.o. female with medical history significant of traumatic C-spine fracture resulting in quadriplegia, currently with trach, PEG, suprapubic catheter, also with history of COPD, chronic hypotension, IDDM, chronic pain, sick sinus syndrome status post pacemaker, chronic decubitus ulcers who was brought to the hospital from her SNF on 6/30 due to increased confusion and agitation.  Patient currently is confused and cannot contribute to the story, per notes at baseline she is awake, alert and oriented, and able to hold a conversation.  ED Course: In the ED she is afebrile, tachycardic up to 120s, normotensive.  She is satting 95% on 10 L via trach.  Blood work shows slightly elevated white count of 11.4, normal renal function, normal lactic acid.  Urinalysis with evidence of UTI. Chest x-ray and CT scan of the head were unremarkable without acute findings, images personally reviewed.  Given concern for sepsis from UTI she was given broad-spectrum antibiotics and we are asked to admit  Assessment & Plan:   Active Problems:   Acute encephalopathy  #1 acute metabolic encephalopathy secondary to sepsis most likely UTI in a patient with history of chronic suprapubic catheter and recurrent UTIs and ESBL.  Patient was started on Vanco cefepime and metronidazole.  Urine culture gram-negative rods more than 100,000 colonies.  Chest x-ray negative.  Blood cultures negative to date.  DC vancomycin and metronidazole.  To new cefepime.  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  Change Tuesday Thursday and Saturday.  #6 history of COPD continue nebulizer as needed  #7 history of depression continue Cymbalta and Wellbutrin  #8 chronic lower extremity edema with hypoalbuminemia.  Seen by dietary for recommendations.  #9 status post PEG tube PEG tube was clogged on admission IR was able to unclog it with gentle aspiration and flushing with normal saline and 2 passes with a scrub brush appreciate IR.   DVT prophylaxis with heparin CODE STATUS full code Family communication none Disposition pending clinical improvement  Pressure Injury 07/20/18 Sacrum (Active)  07/20/18 0146  Location: Sacrum  Location Orientation:   Staging:   Wound Description (Comments):   Present on Admission:      Pressure Injury Ischial tuberosity Left (Active)     Location: Ischial tuberosity  Location Orientation: Left  Staging:   Wound Description (Comments):   Present on Admission:      Pressure Injury Ischial tuberosity Right (Active)     Location: Ischial tuberosity  Location Orientation: Right  Staging:   Wound Description (Comments):   Present on Admission:       Nutrition Problem: Increased nutrient needs Etiology: wound healing     Signs/Symptoms: estimated needs    Interventions: Tube feeding, Prostat  Estimated body mass index is 18.39 kg/m as calculated from the following:   Height as of 06/22/18: 5\' 2"  (1.575 m).   Weight as of this encounter: 45.6 kg.   Subjective:  Patient resting in bed she is awake oriented to the hospital answers some questions appropriately at baseline she is able to have a conversation and follow a  conversation  Objective: Vitals:   07/21/18 0324 07/21/18 0401 07/21/18 0817 07/21/18 0830  BP:  (!) 94/56 126/74   Pulse: 98 89 (!) 101 98  Resp: 18 18 (!) 21 18  Temp:  98.7 F (37.1 C) (!) 102.7 F (39.3 C)   TempSrc:  Oral Oral   SpO2: 96% 97% 96% 100%  Weight:         Intake/Output Summary (Last 24 hours) at 07/21/2018 1027 Last data filed at 07/21/2018 0400 Gross per 24 hour  Intake 842.84 ml  Output -  Net 842.84 ml   Filed Weights   07/20/18 0135 07/20/18 0532  Weight: 47.7 kg 45.6 kg    Examination:  General exam: Appears calm and comfortable  Respiratory system: Few scattered rhonchi to auscultation. Respiratory effort normal. Cardiovascular system: S1 & S2 heard, RRR. No JVD, murmurs, rubs, gallops or clicks. No pedal edema. Gastrointestinal system: Abdomen is nondistended, soft and nontender. No organomegaly or masses felt. Normal bowel sounds heard.  PEG tube in place Central nervous system: Awake oriented to hospital  extremities 2+ pitting edema contracted extremities  skin: Pressure ulcers of the sacrum and bilateral ischial tuberosity    Data Reviewed: I have personally reviewed following labs and imaging studies  CBC: Recent Labs  Lab 07/20/18 0045 07/20/18 0056 07/21/18 0531  WBC 11.4*  --  13.7*  NEUTROABS 8.8*  --   --   HGB 11.9* 12.9 9.8*  HCT 38.5 38.0 31.3*  MCV 91.4  --  89.9  PLT 403*  --  371   Basic Metabolic Panel: Recent Labs  Lab 07/20/18 0045 07/20/18 0056 07/21/18 0531  NA 135 141 135  K 3.9 4.1 4.1  CL 97*  --  101  CO2 25  --  21*  GLUCOSE 200*  --  190*  BUN 26*  --  26*  CREATININE 0.53  --  0.65  CALCIUM 10.6*  --  8.9   GFR: Estimated Creatinine Clearance: 62.6 mL/min (by C-G formula based on SCr of 0.65 mg/dL). Liver Function Tests: Recent Labs  Lab 07/20/18 0045 07/21/18 0531  AST 17 17  ALT 29 22  ALKPHOS 166* 130*  BILITOT 0.2* 0.3  PROT 8.2* 6.8  ALBUMIN 3.3* 2.7*   No results for input(s): LIPASE, AMYLASE in the last 168 hours. No results for input(s): AMMONIA in the last 168 hours. Coagulation Profile: No results for input(s): INR, PROTIME in the last 168 hours. Cardiac Enzymes: No results for input(s): CKTOTAL, CKMB, CKMBINDEX, TROPONINI in the last 168 hours. BNP  (last 3 results) No results for input(s): PROBNP in the last 8760 hours. HbA1C: Recent Labs    07/20/18 0702  HGBA1C 7.1*   CBG: Recent Labs  Lab 07/20/18 1720 07/20/18 1945 07/21/18 0014 07/21/18 0421 07/21/18 0822  GLUCAP 128* 200* 121* 205* 221*   Lipid Profile: No results for input(s): CHOL, HDL, LDLCALC, TRIG, CHOLHDL, LDLDIRECT in the last 72 hours. Thyroid Function Tests: No results for input(s): TSH, T4TOTAL, FREET4, T3FREE, THYROIDAB in the last 72 hours. Anemia Panel: No results for input(s): VITAMINB12, FOLATE, FERRITIN, TIBC, IRON, RETICCTPCT in the last 72 hours. Sepsis Labs: Recent Labs  Lab 07/20/18 0045 07/20/18 0702  LATICACIDVEN 1.2 1.2    Recent Results (from the past 240 hour(s))  Culture, blood (Routine X 2) w Reflex to ID Panel     Status: None (Preliminary result)   Collection Time: 07/20/18 12:45 AM   Specimen: BLOOD LEFT HAND  Result Value Ref  Range Status   Specimen Description BLOOD LEFT HAND  Final   Special Requests   Final    BOTTLES DRAWN AEROBIC AND ANAEROBIC Blood Culture adequate volume   Culture   Final    NO GROWTH 1 DAY Performed at [facility], [address], [place], [name] [address]    Report Status PENDING  Incomplete  SARS Coronavirus 2 (CEPHEID- Performed in [facility] hospital lab), Hosp Order     Status: None   Collection Time: 07/20/18 12:45 AM   Specimen: Nasopharyngeal Swab  Result Value Ref Range Status   SARS Coronavirus 2 NEGATIVE NEGATIVE Final    Comment: (NOTE) If result is NEGATIVE SARS-CoV-2 target nucleic acids are NOT DETECTED. The SARS-CoV-2 RNA is generally detectable in upper and lower  respiratory specimens during the acute phase of infection. The lowest  concentration of SARS-CoV-2 viral copies this assay can detect is 250  copies / mL. A negative result does not preclude SARS-CoV-2 infection  and should not be used as the sole basis for treatment or other  patient management decisions.   A negative result may occur with  improper specimen collection / handling, submission of specimen other  than nasopharyngeal swab, presence of viral mutation(s) within the  areas targeted by this assay, and inadequate number of viral copies  (<250 copies / mL). A negative result must be combined with clinical  observations, patient history, and epidemiological information. If result is POSITIVE SARS-CoV-2 target nucleic acids are DETECTED. The SARS-CoV-2 RNA is generally detectable in upper and lower  respiratory specimens dur ing the acute phase of infection.  Positive  results are indicative of active infection with SARS-CoV-2.  Clinical  correlation with patient history and other diagnostic information is  necessary to determine patient infection status.  Positive results do  not rule out bacterial infection or co-infection with other viruses. If result is PRESUMPTIVE POSTIVE SARS-CoV-2 nucleic acids MAY BE PRESENT.   A presumptive positive result was obtained on the submitted specimen  and confirmed on repeat testing.  While 2019 novel coronavirus  (SARS-CoV-2) nucleic acids may be present in the submitted sample  additional confirmatory testing may be necessary for epidemiological  and / or clinical management purposes  to differentiate between  SARS-CoV-2 and other Sarbecovirus currently known to infect humans.  If clinically indicated additional testing with an alternate test  methodology [phone]) is advised. The SARS-CoV-2 RNA is generally  detectable in upper and lower respiratory sp ecimens during the acute  phase of infection. The expected result is Negative. Fact Sheet for Patients:  StrictlyIdeas.no Fact Sheet for Healthcare Providers: BankingDealers.co.za This test is not yet approved or cleared by the [place] FDA and has been authorized for detection and/or diagnosis of SARS-CoV-2 by FDA under an Emergency Use  Authorization (EUA).  This EUA will remain in effect (meaning this test can be used) for the duration of the COVID-19 declaration under Section 564(b)(1) of the Act, 21 U.S.C. section 360bbb-3(b)(1), unless the authorization is terminated or revoked sooner. Performed at [facility], [place] [address], [place], [place] [address]   Urine Culture     Status: Abnormal (Preliminary result)   Collection Time: 07/20/18 12:56 AM   Specimen: Urine, Random  Result Value Ref Range Status   Specimen Description URINE, RANDOM  Final   Special Requests NONE  Final   Culture (A)  Final    >=100,000 COLONIES/mL GRAM NEGATIVE RODS IDENTIFICATION AND SUSCEPTIBILITIES TO FOLLOW Performed at  [facility], [place] [address], [place], [place] [address]    Report Status PENDING  Incomplete  Culture, blood (Routine X 2) w Reflex to ID Panel     Status: None (Preliminary result)   Collection Time: 07/20/18  7:02 AM   Specimen: BLOOD LEFT HAND  Result Value Ref Range Status   Specimen Description BLOOD LEFT HAND  Final   Special Requests   Final    BOTTLES DRAWN AEROBIC AND ANAEROBIC Blood Culture adequate volume   Culture   Final    NO GROWTH < 24 HOURS Performed at [facility], [place] [address] [place], [place] [address]    Report Status PENDING  Incomplete         Radiology Studies: Dg Abd 1 View  Result Date: 07/20/2018 CLINICAL DATA:  Unable to flush PEG tube. EXAM: ABDOMEN - 1 VIEW COMPARISON:  04/05/2018 FINDINGS: The percutaneous gastrostomy tube is similar in position to the prior study, projecting over the gastric body. Gas and stool are present in the colon. No dilated loops of bowel are seen to suggest obstruction. No acute osseous abnormality is identified. IMPRESSION: Percutaneous gastrostomy tube overlying the stomach. Electronically Signed   By: Logan Bores M.D.   On: 07/20/2018 13:42   Ct Head Wo Contrast  Result Date: 07/20/2018 CLINICAL DATA:  Altered mental  status. EXAM: CT HEAD WITHOUT CONTRAST TECHNIQUE: Contiguous axial images were obtained from the base of the skull through the vertex without intravenous contrast. COMPARISON:  08/22/2018 FINDINGS: Brain: No acute intracranial abnormality. Specifically, no hemorrhage, hydrocephalus, mass lesion, acute infarction, or significant intracranial injury. Vascular: No hyperdense vessel or unexpected calcification. Skull: No acute calvarial abnormality. Sinuses/Orbits: Visualized paranasal sinuses and mastoids clear. Orbital soft tissues unremarkable. Other: None IMPRESSION: Normal study. Electronically Signed   By: Rolm Baptise M.D.   On: 07/20/2018 01:23   Dg Chest Port 1 View  Result Date: 07/20/2018 CLINICAL DATA:  Altered mental status. EXAM: PORTABLE CHEST 1 VIEW COMPARISON:  Choose second 2020 FINDINGS: Tracheostomy tube terminates above the carina. A dual chamber left-sided pacemaker is noted. Heart size is stable from prior studies. There is no pneumothorax. No large pleural effusion. There are bibasilar airspace opacities which may represent atelectasis or developing infiltrate. There is stable blunting of the right costophrenic angle. There appears to be a gastrostomy tube projects over the abdomen. IMPRESSION: No definite acute cardiopulmonary process. Electronically Signed   By: Constance Holster M.D.   On: 07/20/2018 01:09        Scheduled Meds: . buPROPion  150 mg Oral Daily  . chlorhexidine  15 mL Mouth Rinse BID  . DULoxetine  30 mg Oral BID  . famotidine  20 mg Per Tube QHS  . feeding supplement (PRO-STAT SUGAR FREE 64)  30 mL Per Tube Daily  . ferrous sulfate  220 mg Per Tube Q breakfast  . free water  125 mL Per Tube Q6H  . gabapentin  300 mg Oral TID  . heparin  5,000 Units Subcutaneous Q8H  . insulin aspart  0-9 Units Subcutaneous Q4H  . insulin glargine  5 Units Subcutaneous QHS  . mouth rinse  15 mL Mouth Rinse q12n4p  . metoCLOPramide  5 mg Oral QID  . midodrine  10 mg Oral  TID WC  . multivitamin  15 mL Per Tube Daily  . polyethylene glycol  17 g Oral Daily  . umeclidinium bromide  1 puff Inhalation Daily  . zolpidem  2.5  mg Oral QHS   Continuous Infusions: . ceFEPime (MAXIPIME) IV Stopped (07/21/18 0601)  . feeding supplement (JEVITY 1.2 CAL) 1,000 mL (07/20/18 1626)     LOS: 1 day     Georgette Shell, MD [facility]  If 7PM-7AM, please contact night-coverage [email or web address] Password [facility] 07/21/2018, 10:27 AM

## 2018-07-21 NOTE — Consult Note (Addendum)
Urology Consult Note   Requesting Attending Physician:  Georgette Shell, MD Service Providing Consult: Urology  Consulting Attending: Kitt Ledet  Reason for Consult:  Urinary retention  HPI: Angela Page is seen in consultation for reasons noted above at the request of Georgette Shell, MD for evaluation of obstructed suprapubic tube.  This is a 48 y.o. female with history of spinal cord injury with chronic indwelling suprapubic tube that was placed around 2018.  Now admitted to [facility] for increased confusion and agitation.  Suspected urinary tract infection.  Urine has been leaking around the suprapubic tube but not through the tubing.  Suprapubic tube last exchanged in April 2020 and suspect that tube is obstructed.  She is on vancomycin, cefepime, Flagyl.  Decision was made to proceed with suprapubic tube exchange.  10 cc of sterile water were aspirated out of her existing suprapubic catheter and the catheter was removed without resistance.  Her suprapubic tube site was well-established and prepped with Betadine.  New 20 French two-way catheter was inserted with immediate return of cloudy yellow urine.  600 cc of output upon placement.  10 cc of sterile water were placed in the balloon and the catheter was attached to a drainage bag.  Straightforward exchange.  I performed a history and physical examination of the patient and discussed his management with the resident.  I reviewed the resident's note and agree with the documented findings and plan of care    I reviewed chart and spoke with patent; feeling well post tube change; clear urine draining; agree with f/up   Past Medical History: Past Medical History:  Diagnosis Date  . Acute on chronic respiratory failure with hypoxia ([place])   . Anasarca 06/10/2016  . Asthma   . At high risk for severe sepsis   . Cocaine use 10/08/2013  . Decubitus ulcer of buttock, unstageable ([place])   . Depression   . Diabetes mellitus  without complication ([place])   . GERD (gastroesophageal reflux disease)   . HCAP (healthcare-associated pneumonia) 06/09/2016  . Hepatitis    hx of hepatits frm mono   . Kidney stone   . Lobar pneumonia ([place])   . Overdose of opiate or related narcotic ([place]) 09/02/2014  . Pacemaker   . Pleurisy   . Polysubstance dependence including opioid type drug, episodic abuse ([place]) 10/27/2013  . Protein calorie malnutrition ([place]) 10/31/2015  . Quadriparesis ([place])   . Quadriplegia and quadriparesis ([place])   . Quadriplegia, C5-C7 incomplete ([employer]) 10/08/2013  . Stage IV pressure ulcer of sacral region ([place]) 10/31/2015  . Tracheostomy status [facility])     Past Surgical History:  Past Surgical History:  Procedure Laterality Date  . APPENDECTOMY    . BACK SURGERY  10/08/2013   fusion of spine, cervical region  . CARDIAC SURGERY    . CYSTOSCOPY W/ URETERAL STENT PLACEMENT Right 03/09/2017   Procedure: CYSTOSCOPY [name] STENT PLACEMENT;  Surgeon: Irine Seal, MD;  Location: [facility];  Service: Urology;  Laterality: Right;  . CYSTOSCOPY W/ URETERAL STENT REMOVAL Right 07/16/2017   Procedure: CYSTOSCOPY WITH STENT REMOVAL;  Surgeon: Franchot Gallo, MD;  Location: [facility];  Service: Urology;  Laterality: Right;  . CYSTOSCOPY/URETEROSCOPY/HOLMIUM LASER/STENT PLACEMENT Right 07/16/2017   Procedure: CYSTOSCOPY/URETEROSCOPY/HOLMIUM LASER/STENT PLACEMENT/ALSO STONE EXTRACTION;  Surgeon: Franchot Gallo, MD;  Location: [facility];  Service: Urology;  Laterality: Right;  . GASTROSTOMY  11/23/2015  . I&D EXTREMITY  10/28/2011   Procedure: IRRIGATION AND DEBRIDEMENT EXTREMITY;  Surgeon: Tennis Must, MD;  Location: [place];  Service: Orthopedics;  Laterality: Right;  Irrigation and debridement right middle finger  . INSERTION OF SUPRAPUBIC CATHETER N/A 07/28/2016   Procedure: INSERTION OF SUPRAPUBIC CATHETER;  Surgeon: Franchot Gallo, MD;  Location: [facility];  Service: Urology;  Laterality: N/A;  . IR  GENERIC HISTORICAL  11/23/2015   IR GASTROSTOMY TUBE MOD SED 11/23/2015 [facility]  . IR PATIENT EVAL TECH 0-60 MINS  12/18/2017  . IR REPLACE G-TUBE SIMPLE WO FLUORO  12/19/2017  . LITHOTRIPSY  08/2017  . TRACHEOSTOMY      Medication: Current Facility-Administered Medications  Medication Dose Route Frequency Provider Last Rate Last Dose  . buPROPion (WELLBUTRIN SR) 12 hr tablet 150 mg  150 mg Oral Daily Caren Griffins, MD   150 mg at 07/21/18 1105  . ceFEPIme (MAXIPIME) 2 g in sodium chloride 0.9 % 100 mL IVPB  2 g Intravenous Q12H Romona Curls, RPH 200 mL/hr at 07/21/18 1359 2 g at 07/21/18 1359  . chlorhexidine (PERIDEX) 0.12 % solution 15 mL  15 mL Mouth Rinse BID Caren Griffins, MD   15 mL at 07/21/18 1109  . DULoxetine (CYMBALTA) DR capsule 30 mg  30 mg Oral BID Caren Griffins, MD   30 mg at 07/21/18 1108  . famotidine (PEPCID) 40 MG/5ML suspension 20 mg  20 mg Per Tube QHS Caren Griffins, MD   20 mg at 07/20/18 2156  . feeding supplement (JEVITY 1.2 CAL) liquid 1,000 mL  1,000 mL Per Tube Continuous Caren Griffins, MD 55 mL/hr at 07/20/18 1626 1,000 mL at 07/20/18 1626  . feeding supplement (PRO-STAT SUGAR FREE 64) liquid 30 mL  30 mL Per Tube Daily Caren Griffins, MD   30 mL at 07/21/18 1109  . ferrous sulfate 300 (60 Fe) MG/5ML syrup 220 mg  220 mg Per Tube Q breakfast Caren Griffins, MD   220 mg at 07/21/18 0755  . free water 125 mL  125 mL Per Tube Q6H Caren Griffins, MD   125 mL at 07/21/18 1200  . gabapentin (NEURONTIN) capsule 300 mg  300 mg Oral TID Caren Griffins, MD   300 mg at 07/21/18 1108  . heparin injection 5,000 Units  5,000 Units Subcutaneous Q8H Caren Griffins, MD   5,000 Units at 07/21/18 1342  . HYDROcodone-acetaminophen (NORCO/VICODIN) 5-325 MG per tablet 1-2 tablet  1-2 tablet Oral Q4H PRN Caren Griffins, MD   2 tablet at 07/21/18 1340  . insulin aspart (novoLOG) injection 0-9 Units  0-9 Units Subcutaneous Q4H Caren Griffins,  MD   3 Units at 07/21/18 1342  . insulin glargine (LANTUS) injection 10 Units  10 Units Subcutaneous QHS Georgette Shell, MD      . ipratropium-albuterol (DUONEB) 0.5-2.5 (3) MG/3ML nebulizer solution 3 mL  3 mL Nebulization Q6H PRN Gherghe, Vella Redhead, MD      . lip balm (CARMEX) ointment   Topical PRN Caren Griffins, MD      . LORazepam (ATIVAN) tablet 0.5 mg  0.5 mg Oral Q8H PRN Caren Griffins, MD      . MEDLINE mouth rinse  15 mL Mouth Rinse q12n4p Caren Griffins, MD   15 mL at 07/21/18 1347  . metoCLOPramide (REGLAN) tablet 5 mg  5 mg Oral QID Caren Griffins, MD   5 mg at 07/21/18 1342  . midodrine (PROAMATINE) tablet 10 mg  10 mg Oral TID WC Gherghe, Costin  M, MD   10 mg at 07/21/18 1341  . multivitamin liquid 15 mL  15 mL Per Tube Daily Caren Griffins, MD   15 mL at 07/21/18 1107  . polyethylene glycol (MIRALAX / GLYCOLAX) packet 17 g  17 g Oral Daily Caren Griffins, MD   17 g at 07/21/18 1109  . umeclidinium bromide (INCRUSE ELLIPTA) 62.5 MCG/INH 1 puff  1 puff Inhalation Daily Caren Griffins, MD   1 puff at 07/21/18 0829  . zolpidem (AMBIEN) tablet 2.5 mg  2.5 mg Oral QHS Caren Griffins, MD   2.5 mg at 07/20/18 2154    Allergies: Allergies  Allergen Reactions  . Penicillins Hives    Has patient had a PCN reaction causing immediate rash, facial/tongue/throat swelling, SOB or lightheadedness with hypotension: Yes Has patient had a PCN reaction causing severe rash involving mucus membranes or skin necrosis: Yes Has patient had a PCN reaction that required hospitalization No Has patient had a PCN reaction occurring within the last 10 years: No-childhood allergy If all of the above answers are "NO", then may proceed with Cephalosporin  Tolerated cephalosporins in past  . Erythromycin Nausea And Vomiting  . Nitrofurantoin Monohyd Macro Nausea And Vomiting  . Oxybutynin Other (See Comments)    Dries mouth out     Social History: Social History   Tobacco  Use  . Smoking status: Current Every Day Smoker    Packs/day: 1.00    Years: 25.00    Pack years: 25.00    Types: Cigarettes  . Smokeless tobacco: Never Used  Substance Use Topics  . Alcohol use: Never    Frequency: Never  . Drug use: No    Family History Family History  Problem Relation Age of Onset  . Hypertension Maternal Uncle   . Kidney failure Maternal Uncle   . Colon cancer Mother   . Lung cancer Father   . Hypertension Brother   . Kidney failure Maternal Aunt   . Kidney failure Maternal Uncle   . Colon cancer Maternal Grandfather   . Esophageal cancer Neg Hx     Review of Systems 10 systems were reviewed and are negative except as noted specifically in the HPI.  Objective   Vital signs in last 24 hours: BP (!) 163/91 (BP Location: Right Arm)   Pulse 82   Temp 99.4 F (37.4 C) (Oral)   Resp 18   Wt 45.6 kg   LMP 05/27/2016 (Within Days) Comment: Irregular periods since October 2015.  SpO2 100%   BMI 18.39 kg/m   Physical Exam General: NAD, A&O, resting, appropriate HEENT: [place]/AT, EOMI, MMM Pulmonary: Normal work of breathing Cardiovascular: HDS, adequate peripheral perfusion Abdomen: Distended upon initial exam, soft after foley exchange GU: nondraining 20 French catheter exchanged for a new 20 [place] with cloudy yellow urine upon placement.  Extremities: Contracted upper and lower extremities Neuro: Appropriate, no focal neurological deficits  Most Recent Labs: Lab Results  Component Value Date   WBC 13.7 (H) 07/21/2018   HGB 9.8 (L) 07/21/2018   HCT 31.3 (L) 07/21/2018   PLT 321 07/21/2018    Lab Results  Component Value Date   NA 135 07/21/2018   K 4.1 07/21/2018   CL 101 07/21/2018   CO2 21 (L) 07/21/2018   BUN 26 (H) 07/21/2018   CREATININE 0.65 07/21/2018   CALCIUM 8.9 07/21/2018   MG 1.9 04/06/2018   PHOS 3.9 02/02/2018    Lab Results  Component Value Date  INR 1.0 06/22/2018   APTT 28 03/10/2017     IMAGING: Dg Abd 1  View  Result Date: 07/20/2018 CLINICAL DATA:  Unable to flush PEG tube. EXAM: ABDOMEN - 1 VIEW COMPARISON:  04/05/2018 FINDINGS: The percutaneous gastrostomy tube is similar in position to the prior study, projecting over the gastric body. Gas and stool are present in the colon. No dilated loops of bowel are seen to suggest obstruction. No acute osseous abnormality is identified. IMPRESSION: Percutaneous gastrostomy tube overlying the stomach. Electronically Signed   By: Logan Bores M.D.   On: 07/20/2018 13:42   Ct Head Wo Contrast  Result Date: 07/20/2018 CLINICAL DATA:  Altered mental status. EXAM: CT HEAD WITHOUT CONTRAST TECHNIQUE: Contiguous axial images were obtained from the base of the skull through the vertex without intravenous contrast. COMPARISON:  08/22/2018 FINDINGS: Brain: No acute intracranial abnormality. Specifically, no hemorrhage, hydrocephalus, mass lesion, acute infarction, or significant intracranial injury. Vascular: No hyperdense vessel or unexpected calcification. Skull: No acute calvarial abnormality. Sinuses/Orbits: Visualized paranasal sinuses and mastoids clear. Orbital soft tissues unremarkable. Other: None IMPRESSION: Normal study. Electronically Signed   By: Rolm Baptise M.D.   On: 07/20/2018 01:23   Dg Chest Port 1 View  Result Date: 07/20/2018 CLINICAL DATA:  Altered mental status. EXAM: PORTABLE CHEST 1 VIEW COMPARISON:  Choose second 2020 FINDINGS: Tracheostomy tube terminates above the carina. A dual chamber left-sided pacemaker is noted. Heart size is stable from prior studies. There is no pneumothorax. No large pleural effusion. There are bibasilar airspace opacities which may represent atelectasis or developing infiltrate. There is stable blunting of the right costophrenic angle. There appears to be a gastrostomy tube projects over the abdomen. IMPRESSION: No definite acute cardiopulmonary process. Electronically Signed   By: Constance Holster M.D.   On:  07/20/2018 01:09    ------  Assessment:  48 y.o. female with chronic suprapubic tube due to spinal cord injury.  Tube is not been exchanged regularly at her nursing facility.  Obstructed tube is likely the cause of her bladder spasms as her catheter does not normally leak.   Recommendations: -Agree with empiric antibiotics for urinary tract infection, new culture sent when suprapubic tube was exchanged.  Please follow-up and treat for a total course of 14 days using culture specific antibiotics  -Neck suprapubic tube exchange in approximately 5 weeks.  This appointment has been set up at [facility] urology nurse clinic (I spoke with front desk staff today)  -No anticholinergics is leaking around the catheter was likely from obstructed catheter.    Urology will sign off.  Please page urology consult pager with questions or concerns  Thank you for this consult. Please contact the urology consult pager with any further questions/concerns.

## 2018-07-22 LAB — GLUCOSE, CAPILLARY
Glucose-Capillary: 198 mg/dL — ABNORMAL HIGH (ref 70–99)
Glucose-Capillary: 249 mg/dL — ABNORMAL HIGH (ref 70–99)
Glucose-Capillary: 226 mg/dL — ABNORMAL HIGH (ref 70–99)
Glucose-Capillary: 241 mg/dL — ABNORMAL HIGH (ref 70–99)
Glucose-Capillary: 234 mg/dL — ABNORMAL HIGH (ref 70–99)

## 2018-07-22 LAB — URINE CULTURE
Culture: >=100000 — AB
Culture: NO GROWTH

## 2018-07-22 MED ORDER — OXYCODONE HCL 5 MG PO TABS
5.0000 mg | ORAL_TABLET | Freq: Four times a day (QID) | ORAL | Status: DC | PRN
  Administered 2018-07-22 – 2018-07-23 (×4): 5 mg via ORAL
  Filled 2018-07-22 (×5): qty 1

## 2018-07-22 NOTE — Progress Notes (Addendum)
PROGRESS NOTE    Angela Page  CXK:[id number] DOB: Oct 04, 1970 DOA: 07/20/2018 PCP: Default, Provider, MD  Brief Narrative: 48 y.o. female with medical history significant of traumatic C-spine fracture resulting in quadriplegia, currently with trach, PEG, suprapubic catheter, also with history of COPD, chronic hypotension, IDDM, chronic pain, sick sinus syndrome status post pacemaker, chronic decubitus ulcers who was brought to the hospital from her SNF on 6/30 due to increased confusion and agitation.  Patient currently is confused and cannot contribute to the story, per notes at baseline she is awake, alert and oriented, and able to hold a conversation.  ED Course: In the ED she is afebrile, tachycardic up to 120s, normotensive.  She is satting 95% on 10 L via trach.  Blood work shows slightly elevated white count of 11.4, normal renal function, normal lactic acid.  Urinalysis with evidence of UTI. Chest x-ray and CT scan of the head were unremarkable without acute findings, images personally reviewed.  Given concern for sepsis from UTI she was given broad-spectrum antibiotics and we are asked to admit  Assessment & Plan:   Active Problems:   Altered mental status   PEG (percutaneous endoscopic gastrostomy) adjustment/replacement/removal (HCC)   Acute encephalopathy  #1 acute metabolic encephalopathy secondary to sepsis most likely due to complicate UTI in a patient with history of chronic suprapubic catheter and recurrent UTIs and ESBL.  Patient was started on Vanco cefepime and metronidazole.  Urine culture gram-negative rods more than 100,000 colonies.  Chest x-ray negative.  Blood cultures negative to date.  DC vancomycin and metronidazole.  Continue cefepime. Wait for final urine culture to narrow antibiotics before dc.urology recommends 2 weeks of antibiotics for sepsis/complicated uti. Suprapubic catheter changed 07/21/2018  #2 type 2 diabetes increase Lantus  #3 quadriplegia  with trach PEG and contractures of the extremities  #4 chronic hypotension on midodrine,bp has been elevated will stop miodrine  #5 pressure ulcer present on admission-chronic nonhealing stage IV pressure injury to the sacrum and bilateral ischial tuberosities by wound care recommend cleansing the wound with normal saline pat dry and apply Aquacel Ag to each wound bed.  Change Tuesday Thursday and Saturday.  #6 history of COPD continue nebulizer as needed  #7 history of depression continue Cymbalta and Wellbutrin  #8 chronic lower extremity edema with hypoalbuminemia.  Seen by dietary for recommendations.  #9 status post PEG tube PEG tube was clogged on admission IR was able to unclog it with gentle aspiration and flushing with normal saline and 2 passes with a scrub brush appreciate IR.   DVT prophylaxis with heparin CODE STATUS full code Family communication none Disposition pending clinical improvement  Pressure Injury 07/20/18 Sacrum (Active)  07/20/18 0146  Location: Sacrum  Location Orientation:   Staging:   Wound Description (Comments):   Present on Admission:      Pressure Injury Ischial tuberosity Left (Active)     Location: Ischial tuberosity  Location Orientation: Left  Staging:   Wound Description (Comments):   Present on Admission:      Pressure Injury Ischial tuberosity Right (Active)     Location: Ischial tuberosity  Location Orientation: Right  Staging:   Wound Description (Comments):   Present on Admission:       Nutrition Problem: Increased nutrient needs Etiology: wound healing     Signs/Symptoms: estimated needs    Interventions: Tube feeding, Prostat  Estimated body mass index is 18.39 kg/m as calculated from the following:   Height as of 06/22/18: 5\' 2"  (  1.575 m).   Weight as of this encounter: 45.6 kg.   Subjective:  Patient resting in bed she is awake oriented to the hospital answers some questions appropriately at baseline she  is able to have a conversation and follow a conversation  Objective: Vitals:   07/22/18 0852 07/22/18 0853 07/22/18 1139 07/22/18 1208  BP:  (!) 128/99  (!) 176/105  Pulse: 85 86 86 83  Resp: 20 19  18   Temp:  98.7 F (37.1 C)  99.3 F (37.4 C)  TempSrc:  Oral  Oral  SpO2: 99% 99% 99% 98%  Weight:        Intake/Output Summary (Last 24 hours) at 07/22/2018 1511 Last data filed at 07/22/2018 0444 Gross per 24 hour  Intake 340 ml  Output 1250 ml  Net -910 ml   Filed Weights   07/20/18 0135 07/20/18 0532  Weight: 47.7 kg 45.6 kg    Examination:  General exam: Appears calm and comfortable  Respiratory system: Few scattered rhonchi to auscultation. Respiratory effort normal. Cardiovascular system: S1 & S2 heard, RRR. No JVD, murmurs, rubs, gallops or clicks. No pedal edema. Gastrointestinal system: Abdomen is nondistended, soft and nontender. No organomegaly or masses felt. Normal bowel sounds heard.  PEG tube in place Central nervous system: Awake oriented to hospital  extremities 2+ pitting edema contracted extremities  skin: Pressure ulcers of the sacrum and bilateral ischial tuberosity    Data Reviewed: I have personally reviewed following labs and imaging studies  CBC: Recent Labs  Lab 07/20/18 0045 07/20/18 0056 07/21/18 0531  WBC 11.4*  --  13.7*  NEUTROABS 8.8*  --   --   HGB 11.9* 12.9 9.8*  HCT 38.5 38.0 31.3*  MCV 91.4  --  89.9  PLT 403*  --  825   Basic Metabolic Panel: Recent Labs  Lab 07/20/18 0045 07/20/18 0056 07/21/18 0531  NA 135 141 135  K 3.9 4.1 4.1  CL 97*  --  101  CO2 25  --  21*  GLUCOSE 200*  --  190*  BUN 26*  --  26*  CREATININE 0.53  --  0.65  CALCIUM 10.6*  --  8.9   GFR: Estimated Creatinine Clearance: 62.6 mL/min (by C-G formula based on SCr of 0.65 mg/dL). Liver Function Tests: Recent Labs  Lab 07/20/18 0045 07/21/18 0531  AST 17 17  ALT 29 22  ALKPHOS 166* 130*  BILITOT 0.2* 0.3  PROT 8.2* 6.8  ALBUMIN 3.3* 2.7*    No results for input(s): LIPASE, AMYLASE in the last 168 hours. No results for input(s): AMMONIA in the last 168 hours. Coagulation Profile: No results for input(s): INR, PROTIME in the last 168 hours. Cardiac Enzymes: No results for input(s): CKTOTAL, CKMB, CKMBINDEX, TROPONINI in the last 168 hours. BNP (last 3 results) No results for input(s): PROBNP in the last 8760 hours. HbA1C: Recent Labs    07/20/18 0702  HGBA1C 7.1*   CBG: Recent Labs  Lab 07/21/18 2003 07/21/18 2349 07/22/18 0405 07/22/18 0727 07/22/18 1202  GLUCAP 246* 211* 198* 249* 226*   Lipid Profile: No results for input(s): CHOL, HDL, LDLCALC, TRIG, CHOLHDL, LDLDIRECT in the last 72 hours. Thyroid Function Tests: No results for input(s): TSH, T4TOTAL, FREET4, T3FREE, THYROIDAB in the last 72 hours. Anemia Panel: No results for input(s): VITAMINB12, FOLATE, FERRITIN, TIBC, IRON, RETICCTPCT in the last 72 hours. Sepsis Labs: Recent Labs  Lab 07/20/18 0045 07/20/18 0539  LATICACIDVEN 1.2 1.2    Recent Results (  from the past 240 hour(s))  Culture, blood (Routine X 2) w Reflex to ID Panel     Status: None (Preliminary result)   Collection Time: 07/20/18 12:45 AM   Specimen: BLOOD LEFT HAND  Result Value Ref Range Status   Specimen Description BLOOD LEFT HAND  Final   Special Requests   Final    BOTTLES DRAWN AEROBIC AND ANAEROBIC Blood Culture adequate volume   Culture   Final    NO GROWTH 2 DAYS Performed at [facility], [place] [address] [place], [place] [address]    Report Status PENDING  Incomplete  SARS Coronavirus 2 (CEPHEID- Performed in [facility] hospital lab), Hosp Order     Status: None   Collection Time: 07/20/18 12:45 AM   Specimen: Nasopharyngeal Swab  Result Value Ref Range Status   SARS Coronavirus 2 NEGATIVE NEGATIVE Final    Comment: (NOTE) If result is NEGATIVE SARS-CoV-2 target nucleic acids are NOT DETECTED. The SARS-CoV-2 RNA is generally detectable in upper and  lower  respiratory specimens during the acute phase of infection. The lowest  concentration of SARS-CoV-2 viral copies this assay can detect is 250  copies / mL. A negative result does not preclude SARS-CoV-2 infection  and should not be used as the sole basis for treatment or other  patient management decisions.  A negative result may occur with  improper specimen collection / handling, submission of specimen other  than nasopharyngeal swab, presence of viral mutation(s) within the  areas targeted by this assay, and inadequate number of viral copies  (<250 copies / mL). A negative result must be combined with clinical  observations, patient history, and epidemiological information. If result is POSITIVE SARS-CoV-2 target nucleic acids are DETECTED. The SARS-CoV-2 RNA is generally detectable in upper and lower  respiratory specimens dur ing the acute phase of infection.  Positive  results are indicative of active infection with SARS-CoV-2.  Clinical  correlation with patient history and other diagnostic information is  necessary to determine patient infection status.  Positive results do  not rule out bacterial infection or co-infection with other viruses. If result is PRESUMPTIVE POSTIVE SARS-CoV-2 nucleic acids MAY BE PRESENT.   A presumptive positive result was obtained on the submitted specimen  and confirmed on repeat testing.  While 2019 novel coronavirus  (SARS-CoV-2) nucleic acids may be present in the submitted sample  additional confirmatory testing may be necessary for epidemiological  and / or clinical management purposes  to differentiate between  SARS-CoV-2 and other Sarbecovirus currently known to infect humans.  If clinically indicated additional testing with an alternate test  methodology [phone]) is advised. The SARS-CoV-2 RNA is generally  detectable in upper and lower respiratory sp ecimens during the acute  phase of infection. The expected result is Negative.  Fact Sheet for Patients:  StrictlyIdeas.no Fact Sheet for Healthcare Providers: BankingDealers.co.za This test is not yet approved or cleared by the [place] FDA and has been authorized for detection and/or diagnosis of SARS-CoV-2 by FDA under an Emergency Use Authorization (EUA).  This EUA will remain in effect (meaning this test can be used) for the duration of the COVID-19 declaration under Section 564(b)(1) of the Act, 21 U.S.C. section 360bbb-3(b)(1), unless the authorization is terminated or revoked sooner. Performed at [facility], [place] [address] [place], [place] [address]   Urine Culture     Status: Abnormal   Collection Time: 07/20/18 12:56 AM   Specimen: Urine, Random  Result Value  Ref Range Status   Specimen Description URINE, RANDOM  Final   Special Requests   Final    NONE Performed at [facility], [address] [place], [place] [address]    Culture >=100,000 COLONIES/mL PROTEUS MIRABILIS (A)  Final   Report Status 07/22/2018 FINAL  Final   Organism ID, Bacteria PROTEUS MIRABILIS (A)  Final      Susceptibility   Proteus mirabilis - MIC*    AMPICILLIN 4 SENSITIVE Sensitive     CEFAZOLIN 8 SENSITIVE Sensitive     CEFTRIAXONE <=1 SENSITIVE Sensitive     CIPROFLOXACIN >=4 RESISTANT Resistant     GENTAMICIN <=1 SENSITIVE Sensitive     IMIPENEM 8 INTERMEDIATE Intermediate     NITROFURANTOIN 256 RESISTANT Resistant     TRIMETH/SULFA <=20 SENSITIVE Sensitive     AMPICILLIN/SULBACTAM <=2 SENSITIVE Sensitive     PIP/TAZO <=4 SENSITIVE Sensitive     * >=100,000 COLONIES/mL PROTEUS MIRABILIS  Culture, blood (Routine X 2) w Reflex to ID Panel     Status: None (Preliminary result)   Collection Time: 07/20/18  7:02 AM   Specimen: BLOOD LEFT HAND  Result Value Ref Range Status   Specimen Description BLOOD LEFT HAND  Final   Special Requests   Final    BOTTLES DRAWN AEROBIC AND ANAEROBIC Blood Culture  adequate volume   Culture   Final    NO GROWTH 2 DAYS Performed at [facility], [address], [place], [place] [address]    Report Status PENDING  Incomplete  Culture, Urine     Status: None   Collection Time: 07/21/18  4:44 PM   Specimen: Urine, Catheterized  Result Value Ref Range Status   Specimen Description URINE, CATHETERIZED  Final   Special Requests maxipime, flagyl  Final   Culture   Final    NO GROWTH Performed at [facility], [address] [place],  [address]    Report Status 07/22/2018 FINAL  Final         Radiology Studies: No results found.      Scheduled Meds: . buPROPion  150 mg Oral Daily  . chlorhexidine  15 mL Mouth Rinse BID  . DULoxetine  30 mg Oral BID  . famotidine  20 mg Per Tube QHS  . feeding supplement (PRO-STAT SUGAR FREE 64)  30 mL Per Tube Daily  . ferrous sulfate  220 mg Per Tube Q breakfast  . free water  125 mL Per Tube Q6H  . gabapentin  300 mg Oral TID  . heparin  5,000 Units Subcutaneous Q8H  . insulin aspart  0-9 Units Subcutaneous Q4H  . insulin glargine  10 Units Subcutaneous QHS  . mouth rinse  15 mL Mouth Rinse q12n4p  . metoCLOPramide  5 mg Oral QID  . midodrine  10 mg Oral TID WC  . multivitamin  15 mL Per Tube Daily  . polyethylene glycol  17 g Oral Daily  . umeclidinium bromide  1 puff Inhalation Daily  . zolpidem  2.5 mg Oral QHS   Continuous Infusions: . ceFEPime (MAXIPIME) IV 2 g (07/22/18 1237)  . feeding supplement (JEVITY 1.2 CAL) 1,000 mL (07/20/18 1626)     LOS: 2 days     Georgette Shell, MD [facility]  If 7PM-7AM, please contact night-coverage [email or web address] Password [email or web address] 07/22/2018, 3:11 PM

## 2018-07-22 NOTE — Progress Notes (Signed)
I spoke with Angela Page briefly about her tracheostomy. She says she has had a tracheostomy for about 2 years. Her tracheostomy tube is a size 6 Shiley uncuffed - I updated the LDA to reflect this. She has been in a SNF for some time now, but when she was home, a home health nurse changed out her trach and they alternated with her visits to the trach clinic with Marni Griffon, NP. Tracheostomy site is clean, dry, skin is intact. No secretions present during my assessment. Some dried, crusty mucus at 6 o'clock - loose piece cleaned away. I cleaned her glasses for her per her request, and brought her a diet coke.

## 2018-07-23 LAB — GLUCOSE, CAPILLARY
Glucose-Capillary: 151 mg/dL — ABNORMAL HIGH (ref 70–99)
Glucose-Capillary: 180 mg/dL — ABNORMAL HIGH (ref 70–99)
Glucose-Capillary: 212 mg/dL — ABNORMAL HIGH (ref 70–99)
Glucose-Capillary: 213 mg/dL — ABNORMAL HIGH (ref 70–99)
Glucose-Capillary: 231 mg/dL — ABNORMAL HIGH (ref 70–99)

## 2018-07-23 MED ORDER — CEFDINIR 300 MG PO CAPS: 300.0000 mg | ORAL_CAPSULE | Freq: Two times a day (BID) | ORAL | 0 refills | Status: DC

## 2018-07-23 NOTE — TOC Transition Note (Signed)
Transition of Care [facility]) - CM/SW Discharge Note   Patient Details  Name: Angela Page MRN: [id number] Date of Birth: 05/21/1970  [facility] of [facility]) CM/SW Contact:  Gelene Mink, [place] Phone Number: 07/23/2018, 11:55 AM   Clinical Narrative:     Patient will DC to: [place] Anticipated DC date: 07/23/2018 Family notified: Yes Transport by: Corey Harold   Per MD patient ready for DC to . RN, patient, patient's family, and facility notified of DC. Discharge Summary and FL2 sent to facility. RN to call report prior to discharge ([phone]). The patient will report to room 201.  DC packet on chart. Ambulance transport requested for patient.   CSW will sign off for now as social work intervention is no longer needed. Please consult [name] again if new needs arise.  Shabana Armentrout, LCSW-A [place]/Clinical Social Work Department Cell: [phone]    Final next level of care: [place] Barriers to Discharge: No Barriers Identified   Patient Goals and CMS Choice Patient states their goals for this hospitalization and ongoing recovery are:: Pt wil [email or web address].gov Compare Post Acute Care list provided to:: Patient Choice offered to / list presented to : NA  Discharge Placement                       Discharge Plan and Services In-house Referral: Clinical Social Work Discharge Planning Services: NA Post Acute Care Choice: Skilled Nursing Facility          DME Arranged: N/A DME Agency: NA       HH Arranged: NA HH Agency: NA        Social Determinants of Health (SDOH) Interventions     Readmission Risk Interventions Readmission Risk Prevention Plan 06/24/2018  Transportation Screening Complete  Medication Review Press photographer) Complete  HRI or Home Care Consult Complete  SW Recovery Care/Counseling Consult Complete  Palliative Care Screening Not Applicable  Skilled Nursing Facility Complete   Some recent data might be hidden

## 2018-07-23 NOTE — Procedures (Signed)
Tracheostomy Change Note  Patient Details:   Name: Angela Page DOB: 14-Jan-1971 MRN: [id number]    Airway Documentation:     Evaluation  O2 sats: stable throughout Complications: No apparent complications Patient did tolerate procedure well. Bilateral Breath Sounds: Clear, Diminished    Trach changed per MD order at 1120. Changed to a #6CLFS, positive End Tidal, good breaths sounds heard, pt tolerated very well. PMV placed back on, pt currently talking well. Will continue to monitor.   Letishia Elliott A Andre Swander 07/23/2018, 11:27 AM

## 2018-07-23 NOTE — NC FL2 (Signed)
[place] LEVEL OF CARE SCREENING TOOL     IDENTIFICATION  Patient Name: Angela Page Birthdate: 1970/06/11 Sex: female Admission Date (Current Location): 07/20/2018  [place] and [place] Number:  Kathleen Argue [id number] [place] and Address:  [facility] [place], [place] [address] [place], [place] [address]      Provider Number: [id number]  Attending Physician Name and Address:  Georgette Shell, MD  Relative Name and Phone Number:  [name], Mother, [place], Brother, [phone]    Current Level of Care: Hospital Recommended Level of Care: [place] Prior Approval Number:    Date Approved/Denied:   PASRR Number: [id number]  Discharge Plan: SNF    Current Diagnoses: Patient Active Problem List   Diagnosis Date Noted  . Acute encephalopathy 07/20/2018  . Hypercalcemia 06/23/2018  . Thrombocytosis ([place]) 06/22/2018  . Chronic respiratory failure with hypoxia ([place])   . Acute on chronic respiratory failure ([place])   . Quadriplegia and quadriparesis ([place])   . At high risk for severe sepsis   . Decubitus ulcer of buttock, unstageable ([place])   . Lactic acid acidosis 12/16/2017  . Suprapubic catheter ([place]) 12/16/2017  . Pressure injury of skin 07/17/2017  . Kidney stone 07/16/2017  . Right ureteral stone 03/10/2017  . Complicated UTI (urinary tract infection) 03/09/2017  . Ineffective airway clearance   . Sepsis secondary to UTI ([place]) 06/30/2016  . Autonomic neuropathy 06/10/2016  . Presence of permanent cardiac pacemaker 06/10/2016  . 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 Chronically on opiate therapy 06/10/2016  . PEG (percutaneous endoscopic gastrostomy) status ([place]) 06/10/2016  . Pulmonary hypertension ([place]) 06/10/2016  . Hypoalbuminemia 06/10/2016  . Peripheral edema   . Tracheostomy dependence ([place])   . Chronic obstructive pulmonary disease ([place])   . Chronic diastolic  CHF (congestive heart failure) ([place]) 01/23/2016  . Tracheostomy status ([place])   . Esophageal dysphagia   . Malnutrition of moderate degree 11/01/2015  . Tobacco abuse 10/31/2015  . Asthma 10/31/2015  . History of pneumonia 10/08/2015  . Pressure ulcer of contiguous region involving buttock and hip, stage 4 ([place]) 02/26/2015  . Normocytic normochromic anemia 02/25/2015  . Altered mental status 10/29/2014  . Acute metabolic encephalopathy 69/48/5462  . Leukocytosis 10/23/2014  . Hypokalemia 10/23/2014  . Decubitus ulcer of sacral region, stage 4 ([place]) 10/23/2014  . Anemia, iron deficiency 10/23/2014  . Quadriplegia, C5-C7 incomplete ([place]) 10/22/2014  . Depression   . Protein-calorie malnutrition, severe ([place]) 09/05/2014  . 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 Recurrent UTI 03/31/2014  . Pressure ulcer of coccygeal region, stage 4 ([place]) 01/06/2014  . Stage 4 skin ulcer of sacral region ([place]) 01/06/2014  . Neuropathic pain 11/30/2013  . Paraplegia following spinal cord injury ([place]) 11/30/2013  . PEG (percutaneous endoscopic gastrostomy) adjustment/replacement/removal ([place]) 11/30/2013  . Spinal cord injury, cervical region ([place]) 11/30/2013  . Chronic pain due to injury 11/30/2013  . S/P cervical spinal fusion 10/27/2013  . Tracheostomy care ([place]) 10/27/2013  . 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 SCI (spinal cord injury) 10/11/2013    Orientation RESPIRATION BLADDER Height & Weight     Self, Time, Situation, Place  Tracheostomy(99, 5L, 28) Continent(Suprapubic cath) Weight: 100 lb 8.5 oz (45.6 kg) Height:     BEHAVIORAL SYMPTOMS/MOOD NEUROLOGICAL BOWEL NUTRITION STATUS      Continent(suprapubic catheter) Diet, Feeding tube(feeding supplement (JEVITY 1.2 CAL) liquid 1,000 mL & dys 1 diet, thin liquids)  AMBULATORY STATUS COMMUNICATION OF NEEDS Skin  Total Care Verbally Skin abrasions, Other (Comment)(MASD on buttocks, abrasion on breast, pressure injury on  sacrum and ichial tubersoity (change dressings every other day))                       Personal Care Assistance Level of Assistance  Bathing, Feeding, Dressing, Total care Bathing Assistance: Maximum assistance Feeding assistance: Limited assistance Dressing Assistance: Maximum assistance Total Care Assistance: Maximum assistance   Functional Limitations Info  Sight, Hearing, Speech Sight Info: Adequate Hearing Info: Adequate Speech Info: Adequate    SPECIAL CARE FACTORS FREQUENCY  PT (By licensed PT), OT (By licensed OT)     PT Frequency: 5x/wk OT Frequency: 5x/wk            Contractures      Additional Factors Info  Code Status, Allergies, Psychotropic, Insulin Sliding Scale Code Status Info: Full Code Allergies Info: Penicillins, Erythromycin, Nitrofurantoin Monohyd Macro, Oxybutynin Psychotropic Info: lorazepam (.05mg ) every 8 hours PRN Insulin Sliding Scale Info: insulin aspart novolog 0-9 units every 4 hours & insulin glargine (lantus) injection 10 units at bedtime       Current Medications (07/23/2018):  This is the current hospital active medication list Current Facility-Administered Medications  Medication Dose Route Frequency Provider Last Rate Last Dose  . buPROPion [facility] SR) 12 hr tablet 150 mg  150 mg Oral Daily Caren Griffins, MD   150 mg at 07/23/18 4098  . ceFEPIme (MAXIPIME) 2 g in sodium chloride 0.9 % 100 mL IVPB  2 g Intravenous Q12H Romona Curls, RPH 200 mL/hr at 07/23/18 0144 2 g at 07/23/18 0144  . chlorhexidine (PERIDEX) 0.12 % solution 15 mL  15 mL Mouth Rinse BID Caren Griffins, MD   15 mL at 07/23/18 0928  . DULoxetine (CYMBALTA) DR capsule 30 mg  30 mg Oral BID Caren Griffins, MD   30 mg at 07/23/18 1191  . famotidine (PEPCID) 40 MG/5ML suspension 20 mg  20 mg Per Tube QHS Caren Griffins, MD   20 mg at 07/22/18 2243  . feeding supplement (JEVITY 1.2 CAL) liquid 1,000 mL  1,000 mL Per Tube Continuous Caren Griffins, MD  55 mL/hr at 07/20/18 1626 1,000 mL at 07/20/18 1626  . feeding supplement (PRO-STAT SUGAR FREE 64) liquid 30 mL  30 mL Per Tube Daily Caren Griffins, MD   30 mL at 07/23/18 0929  . ferrous sulfate 300 (60 Fe) MG/5ML syrup 220 mg  220 mg Per Tube Q breakfast Caren Griffins, MD   300 mg at 07/23/18 0927  . free water 125 mL  125 mL Per Tube Q6H Caren Griffins, MD   125 mL at 07/23/18 4782  . gabapentin (NEURONTIN) capsule 300 mg  300 mg Oral TID Caren Griffins, MD   300 mg at 07/23/18 9562  . heparin injection 5,000 Units  5,000 Units Subcutaneous Q8H Caren Griffins, MD   5,000 Units at 07/23/18 0507  . insulin aspart (novoLOG) injection 0-9 Units  0-9 Units Subcutaneous Q4H Caren Griffins, MD   3 Units at 07/23/18 [phone]  . insulin glargine (LANTUS) injection 10 Units  10 Units Subcutaneous QHS Georgette Shell, MD   10 Units at 07/22/18 2242  . ipratropium-albuterol (DUONEB) 0.5-2.5 (3) MG/3ML nebulizer solution 3 mL  3 mL Nebulization Q6H PRN Gherghe, Vella Redhead, MD      . lip balm (CARMEX) ointment   Topical PRN Caren Griffins, MD      .  LORazepam (ATIVAN) tablet 0.5 mg  0.5 mg Oral Q8H PRN Caren Griffins, MD   0.5 mg at 07/22/18 2240  . MEDLINE mouth rinse  15 mL Mouth Rinse q12n4p Caren Griffins, MD   15 mL at 07/22/18 1652  . metoCLOPramide (REGLAN) tablet 5 mg  5 mg Oral QID Caren Griffins, MD   5 mg at 07/23/18 9021  . multivitamin liquid 15 mL  15 mL Per Tube Daily Caren Griffins, MD   15 mL at 07/23/18 0928  . oxyCODONE (Oxy IR/ROXICODONE) immediate release tablet 5 mg  5 mg Oral Q6H PRN Georgette Shell, MD   5 mg at 07/23/18 1155  . polyethylene glycol (MIRALAX / GLYCOLAX) packet 17 g  17 g Oral Daily Caren Griffins, MD   17 g at 07/21/18 1109  . umeclidinium bromide (INCRUSE ELLIPTA) 62.5 MCG/INH 1 puff  1 puff Inhalation Daily Caren Griffins, MD   1 puff at 07/23/18 0836  . zolpidem (AMBIEN) tablet 2.5 mg  2.5 mg Oral QHS Caren Griffins, MD    2.5 mg at 07/22/18 2242     Discharge Medications: Please see discharge summary for a list of discharge medications.  Relevant Imaging Results:  Relevant Lab Results:   Additional Information SSN: [id number]  Philippa Chester Meloney Feld, LCSWA

## 2018-07-23 NOTE — Progress Notes (Signed)
Pt left with PTAR, dressing changed prior to D/C, all lines removed, going to [facility]. Report called and given to [facility], unit nurse.  Arlis Porta

## 2018-07-23 NOTE — Discharge Summary (Signed)
Physician Discharge Summary  Angela Page GLO:[id number] DOB: [birth year] DOA: 07/20/2018  PCP: Default, Provider, MD  Admit date: 07/20/2018 Discharge date: 07/23/2018  Admitted From: Nursing home Disposition: Nursing home  Recommendations for Outpatient Follow-up:  1. Follow up with PCP in 1-2 weeks 2. Please obtain BMP/CBC in one week 3. Please follow up on the following pending results:  Home Health none Equipment/Devices none Discharge Condition stable  CODE STATUS full code  diet recommendation: Jevity 1.2 at 55 mL/h via PEG tube 30 mL pro-stat daily 125 mL free water flush every 6 hours   brief/Interim Summary:48 y.o.femalewith medical history significant oftraumatic C-spine fracture resulting in quadriplegia,currently with trach, PEG, suprapubic catheter, also with history of COPD, chronic hypotension, IDDM, chronic pain, sick sinus syndrome status post pacemaker, chronic decubitus ulcers who was brought to the hospital from her SNF on 6/30due to increased confusion and agitation. Patient currently is confused and cannot contribute to the story, per notes at baseline she is awake, alert and oriented, and able to hold a conversation.  ED Course:In the ED she is afebrile, tachycardic up to 120s, normotensive. She is satting 95% on 10 L via trach. Blood work shows slightly elevated white count of 11.4, normal renal function, normal lactic acid. Urinalysis with evidence of UTI. 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  Urine culture grew Proteus sensitive to cefepime.  Initially she was treated with Vanco cefepime and Flagyl.  Vancomycin and Flagyl were stopped and cefepime was continued once we got the urine culture back.  She tolerated cefepime well even though she has allergies to penicillin.  She will be discharged on cefdinir 300 mg twice a day for 10 days.  Her suprapubic catheter was changed by [facility] on 07/21/2018.  This needs to be changed every 5 weeks at the [facility] urology outpatient office.   #2 type 2 diabetes continue Trulicity.  #3 quadriplegia with trach PEG and contractures of the extremities  #4 chronic hypotension on midodrine midodrine was held for a day due to high blood pressure her blood pressure is back to being soft and restarted midodrine.  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  Change Tuesday Thursday and Saturday.  #6 history of COPD stable  #7 history of depression continue Cymbalta and Wellbutrin  #8 chronic lower extremity edema with hypoalbuminemia.  #9 status post PEG tube PEG tube was clogged on admission IR was able to unclog it with gentle aspiration and flushing with normal saline and 2 passes with a scrub brush appreciate IR.  #10 status post trach patient was seen by speech therapy for evaluation for PMV.  PMV is in place and tolerated quite well no speech needs were identified.    Pressure Injury 07/20/18 Sacrum (Active)  07/20/18 0146  Location: Sacrum  Location Orientation:   Staging:  Wound Description (Comments):   Present on Admission:      Pressure Injury Ischial tuberosity Left (Active)     Location: Ischial tuberosity  Location  Orientation: Left  Staging:   Wound Description (Comments):   Present on Admission:      Pressure Injury Ischial tuberosity Right (Active)     Location: Ischial tuberosity  Location Orientation: Right  Staging:   Wound Description (Comments):   Present on Admission:       Nutrition Problem: Increased nutrient needs Etiology: wound healing    Signs/Symptoms: estimated needs     Interventions: Tube feeding, Prostat  Estimated body mass index is 18.39 kg/m as calculated from the following:   Height as of 06/22/18: 5\' 2"  (1.575 m).   Weight as of this encounter: 45.6 kg.  Discharge Instructions  Discharge Instructions    Call MD for:  difficulty breathing, headache or visual disturbances   Complete by: As directed    Call MD for:  persistant nausea and vomiting   Complete by: As directed    Call MD for:  severe uncontrolled pain   Complete by: As directed    Call MD for:  temperature >100.4   Complete by: As directed    Diet - low sodium heart healthy   Complete by: As directed    Increase activity slowly   Complete by: As directed      Allergies as of 07/23/2018      Reactions   Penicillins Hives   Has patient had a PCN reaction causing immediate rash, facial/tongue/throat swelling, SOB or lightheadedness with hypotension: Yes Has patient had a PCN reaction causing severe rash involving mucus membranes or skin necrosis: Yes Has patient had a PCN reaction that required hospitalization No Has patient had a PCN reaction occurring within the last 10 years: No-childhood allergy If all of the above answers are "NO", then may proceed with Cephalosporin  Tolerated cephalosporins in past   Erythromycin Nausea And Vomiting   Nitrofurantoin Monohyd Macro Nausea And Vomiting   Oxybutynin Other (See Comments)   Dries mouth out       Medication List    STOP taking these medications   LORazepam 0.5 MG tablet Commonly known as: Ativan     TAKE these medications    Basaglar KwikPen 100 UNIT/ML Sopn Inject 0.05 mLs (5 Units total) into the skin at bedtime.   buPROPion 150 MG 12 hr tablet Commonly known as: WELLBUTRIN SR Take 150 mg by mouth daily.   cefdinir 300 MG capsule Commonly known as: OMNICEF Take 1 capsule (300 mg total) by mouth 2 (two) times daily.   DULoxetine 30 MG capsule Commonly known as: CYMBALTA Take 1 capsule (30 mg total) by mouth 2 (two) times daily.   enoxaparin 40 MG/0.4ML injection Commonly known as: LOVENOX Inject 40 mg into the skin daily.   famotidine 40 MG/5ML suspension Commonly known as: PEPCID Place 2.5 mLs (20 mg total) into feeding tube at bedtime.   ProMod Liqd Take 30 mLs by mouth 3 (three) times daily. What changed: Another medication with the same name was changed. Make sure you understand how and when to take each.   feeding supplement (GLUCERNA 1.2 CAL) Liqd Place 1,000 mLs into feeding tube daily. What changed:   when to take this  additional instructions   ferrous sulfate 300 (60 Fe) MG/5ML syrup Place 3.7 mLs (220 mg total) into feeding tube daily with breakfast.   gabapentin 300 MG capsule Commonly known as: NEURONTIN  Take 300 mg by mouth 3 (three) times daily.   insulin aspart 100 UNIT/ML injection Commonly known as: novoLOG Inject 10 Units into the skin every 4 (four) hours. For glucose 150 to 200 use 1 units, for glucose 201-250 use 2 units, for 251 to 300 use 4 units, for 301 to 350 use 6 units, for 351 to 400 use 8 units, for 401 or greater use 10 units. What changed:   how much to take  when to take this  additional instructions   lip balm ointment Apply topically as needed for lip care.   loratadine 10 MG tablet Commonly known as: CLARITIN Take 10 mg by mouth daily. For seven days for rash Started on 6.28.20   metoCLOPramide 5 MG tablet Commonly known as: REGLAN Place 5 mg into feeding tube 4 (four) times daily.   midodrine 10 MG tablet Commonly known as:  PROAMATINE Take 10 mg by mouth 3 (three) times daily as needed (hypotension).   OXYGEN Inhale 3 L into the lungs continuous.   polyethylene glycol 17 g packet Commonly known as: MIRALAX / GLYCOLAX Take 17 g by mouth daily. Hold if diarrhea.   tiotropium 18 MCG inhalation capsule Commonly known as: SPIRIVA Place 18 mcg into inhaler and inhale daily.   Trulicity 1.5 KZ/6.0FU Sopn Generic drug: Dulaglutide Inject 1.5 mg into the skin every Friday.   zolpidem 5 MG tablet Commonly known as: AMBIEN Take 2.5 mg by mouth at bedtime.       Allergies  Allergen Reactions  . Penicillins Hives    Has patient had a PCN reaction causing immediate rash, facial/tongue/throat swelling, SOB or lightheadedness with hypotension: Yes Has patient had a PCN reaction causing severe rash involving mucus membranes or skin necrosis: Yes Has patient had a PCN reaction that required hospitalization No Has patient had a PCN reaction occurring within the last 10 years: No-childhood allergy If all of the above answers are "NO", then may proceed with Cephalosporin  Tolerated cephalosporins in past  . Erythromycin Nausea And Vomiting  . Nitrofurantoin Monohyd Macro Nausea And Vomiting  . Oxybutynin Other (See Comments)    Dries mouth out     Consultations: Urology  Procedures/Studies: Dg Abd 1 View  Result Date: 07/20/2018 CLINICAL DATA:  Unable to flush PEG tube. EXAM: ABDOMEN - 1 VIEW COMPARISON:  04/05/2018 FINDINGS: The percutaneous gastrostomy tube is similar in position to the prior study, projecting over the gastric body. Gas and stool are present in the colon. No dilated loops of bowel are seen to suggest obstruction. No acute osseous abnormality is identified. IMPRESSION: Percutaneous gastrostomy tube overlying the stomach. Electronically Signed   By: Logan Bores M.D.   On: 07/20/2018 13:42   Ct Head Wo Contrast  Result Date: 07/20/2018 CLINICAL DATA:  Altered mental status. EXAM: CT HEAD  WITHOUT CONTRAST TECHNIQUE: Contiguous axial images were obtained from the base of the skull through the vertex without intravenous contrast. COMPARISON:  08/22/2018 FINDINGS: Brain: No acute intracranial abnormality. Specifically, no hemorrhage, hydrocephalus, mass lesion, acute infarction, or significant intracranial injury. Vascular: No hyperdense vessel or unexpected calcification. Skull: No acute calvarial abnormality. Sinuses/Orbits: Visualized paranasal sinuses and mastoids clear. Orbital soft tissues unremarkable. Other: None IMPRESSION: Normal study. Electronically Signed   By: Rolm Baptise M.D.   On: 07/20/2018 01:23   Dg Chest Port 1 View  Result Date: 07/20/2018 CLINICAL DATA:  Altered mental status. EXAM: PORTABLE CHEST 1 VIEW COMPARISON:  Choose second 2020 FINDINGS: Tracheostomy tube terminates above  the carina. A dual chamber left-sided pacemaker is noted. Heart size is stable from prior studies. There is no pneumothorax. No large pleural effusion. There are bibasilar airspace opacities which may represent atelectasis or developing infiltrate. There is stable blunting of the right costophrenic angle. There appears to be a gastrostomy tube projects over the abdomen. IMPRESSION: No definite acute cardiopulmonary process. Electronically Signed   By: Constance Holster M.D.   On: 07/20/2018 01:09    (Echo, Carotid, EGD, Colonoscopy, ERCP)    Subjective: Patient is resting in bed awake alert oriented anxious to go back to the nursing home  Discharge Exam: Vitals:   07/23/18 0410 07/23/18 0754  BP: 110/85 105/78  Pulse: 85 92  Resp: 18 16  Temp: 98.7 F (37.1 C) 98.9 F (37.2 C)  SpO2: 97% 99%   Vitals:   07/23/18 0020 07/23/18 0409 07/23/18 0410 07/23/18 0754  BP: 114/84  110/85 105/78  Pulse: 89 87 85 92  Resp: 18 14 18 16   Temp: 99.1 F (37.3 C)  98.7 F (37.1 C) 98.9 F (37.2 C)  TempSrc: Oral  Oral Oral  SpO2: 97% 97% 97% 99%  Weight:        General: Pt is alert,  awake, not in acute distress Cardiovascular: RRR, S1/S2 +, no rubs, no gallops Respiratory: CTA bilaterally, no wheezing, no rhonchi trach in place Abdominal: Soft, NT, ND, bowel sounds + PEG in place with suprapubic catheter no drainage or erythema noted Extremities: 2+ pitting edema   The results of significant diagnostics from this hospitalization (including imaging, microbiology, ancillary and laboratory) are listed below for reference.     Microbiology: Recent Results (from the past 240 hour(s))  Culture, blood (Routine X 2) w Reflex to ID Panel     Status: None (Preliminary result)   Collection Time: 07/20/18 12:45 AM   Specimen: BLOOD LEFT HAND  Result Value Ref Range Status   Specimen Description BLOOD LEFT HAND  Final   Special Requests   Final    BOTTLES DRAWN AEROBIC AND ANAEROBIC Blood Culture adequate volume   Culture   Final    NO GROWTH 3 DAYS Performed at [facility], [place] [address] [place], [place] [address]    Report Status PENDING  Incomplete  SARS Coronavirus 2 (CEPHEID- Performed in [place] hospital lab), Hosp Order     Status: None   Collection Time: 07/20/18 12:45 AM   Specimen: Nasopharyngeal Swab  Result Value Ref Range Status   SARS Coronavirus 2 NEGATIVE NEGATIVE Final    Comment: (NOTE) If result is NEGATIVE SARS-CoV-2 target nucleic acids are NOT DETECTED. The SARS-CoV-2 RNA is generally detectable in upper and lower  respiratory specimens during the acute phase of infection. The lowest  concentration of SARS-CoV-2 viral copies this assay can detect is 250  copies / mL. A negative result does not preclude SARS-CoV-2 infection  and should not be used as the sole basis for treatment or other  patient management decisions.  A negative result may occur with  improper specimen collection / handling, submission of specimen other  than nasopharyngeal swab, presence of viral mutation(s) within the  areas targeted by this assay, and inadequate  number of viral copies  (<250 copies / mL). A negative result must be combined with clinical  observations, patient history, and epidemiological information. If result is POSITIVE SARS-CoV-2 target nucleic acids are DETECTED. The SARS-CoV-2 RNA is generally detectable in upper and lower  respiratory specimens dur ing the  acute phase of infection.  Positive  results are indicative of active infection with SARS-CoV-2.  Clinical  correlation with patient history and other diagnostic information is  necessary to determine patient infection status.  Positive results do  not rule out bacterial infection or co-infection with other viruses. If result is PRESUMPTIVE POSTIVE SARS-CoV-2 nucleic acids MAY BE PRESENT.   A presumptive positive result was obtained on the submitted specimen  and confirmed on repeat testing.  While 2019 novel coronavirus  (SARS-CoV-2) nucleic acids may be present in the submitted sample  additional confirmatory testing may be necessary for epidemiological  and / or clinical management purposes  to differentiate between  SARS-CoV-2 and other Sarbecovirus currently known to infect humans.  If clinically indicated additional testing with an alternate test  methodology [phone]) is advised. The SARS-CoV-2 RNA is generally  detectable in upper and lower respiratory sp ecimens during the acute  phase of infection. The expected result is Negative. Fact Sheet for Patients:  StrictlyIdeas.no Fact Sheet for Healthcare Providers: BankingDealers.co.za This test is not yet approved or cleared by the [place] FDA and has been authorized for detection and/or diagnosis of SARS-CoV-2 by FDA under an Emergency Use Authorization (EUA).  This EUA will remain in effect (meaning this test can be used) for the duration of the COVID-19 declaration under Section 564(b)(1) of the Act, 21 U.S.C. section 360bbb-3(b)(1), unless the  authorization is terminated or revoked sooner. Performed at [facility], [place] [address], [place], [place] [address]   Urine Culture     Status: Abnormal   Collection Time: 07/20/18 12:56 AM   Specimen: Urine, Random  Result Value Ref Range Status   Specimen Description URINE, RANDOM  Final   Special Requests   Final    NONE Performed at [facility], [place] [address], [place], [place] [address]    Culture >=100,000 COLONIES/mL PROTEUS MIRABILIS (A)  Final   Report Status 07/22/2018 FINAL  Final   Organism ID, Bacteria PROTEUS MIRABILIS (A)  Final      Susceptibility   Proteus mirabilis - MIC*    AMPICILLIN 4 SENSITIVE Sensitive     CEFAZOLIN 8 SENSITIVE Sensitive     CEFTRIAXONE <=1 SENSITIVE Sensitive     CIPROFLOXACIN >=4 RESISTANT Resistant     GENTAMICIN <=1 SENSITIVE Sensitive     IMIPENEM 8 INTERMEDIATE Intermediate     NITROFURANTOIN 256 RESISTANT Resistant     TRIMETH/SULFA <=20 SENSITIVE Sensitive     AMPICILLIN/SULBACTAM <=2 SENSITIVE Sensitive     PIP/TAZO <=4 SENSITIVE Sensitive     * >=100,000 COLONIES/mL PROTEUS MIRABILIS  Culture, blood (Routine X 2) w Reflex to ID Panel     Status: None (Preliminary result)   Collection Time: 07/20/18  7:02 AM   Specimen: BLOOD LEFT HAND  Result Value Ref Range Status   Specimen Description BLOOD LEFT HAND  Final   Special Requests   Final    BOTTLES DRAWN AEROBIC AND ANAEROBIC Blood Culture adequate volume   Culture   Final    NO GROWTH 3 DAYS Performed at [facility], [address], [place], [place] [address]    Report Status PENDING  Incomplete  Culture, Urine     Status: None   Collection Time: 07/21/18  4:44 PM   Specimen: Urine, Catheterized  Result Value Ref Range Status   Specimen Description URINE, CATHETERIZED  Final   Special Requests maxipime, flagyl  Final   Culture   Final  NO GROWTH Performed at [facility], [place] [address], [place], [place] [address]    Report Status  07/22/2018 FINAL  Final     Labs: BNP (last 3 results) Recent Labs    12/16/17 0307 02/01/18 2014  BNP 280.4* 09.3   Basic Metabolic Panel: Recent Labs  Lab 07/20/18 0045 07/20/18 0056 07/21/18 0531  NA 135 141 135  K 3.9 4.1 4.1  CL 97*  --  101  CO2 25  --  21*  GLUCOSE 200*  --  190*  BUN 26*  --  26*  CREATININE 0.53  --  0.65  CALCIUM 10.6*  --  8.9   Liver Function Tests: Recent Labs  Lab 07/20/18 0045 07/21/18 0531  AST 17 17  ALT 29 22  ALKPHOS 166* 130*  BILITOT 0.2* 0.3  PROT 8.2* 6.8  ALBUMIN 3.3* 2.7*   No results for input(s): LIPASE, AMYLASE in the last 168 hours. No results for input(s): AMMONIA in the last 168 hours. CBC: Recent Labs  Lab 07/20/18 0045 07/20/18 0056 07/21/18 0531  WBC 11.4*  --  13.7*  NEUTROABS 8.8*  --   --   HGB 11.9* 12.9 9.8*  HCT 38.5 38.0 31.3*  MCV 91.4  --  89.9  PLT 403*  --  321   Cardiac Enzymes: No results for input(s): CKTOTAL, CKMB, CKMBINDEX, TROPONINI in the last 168 hours. BNP: Invalid input(s): POCBNP CBG: Recent Labs  Lab 07/22/18 1202 07/22/18 1554 07/22/18 2002 07/23/18 0019 07/23/18 0409  GLUCAP 226* 241* 234* 212* 180*   D-Dimer No results for input(s): DDIMER in the last 72 hours. Hgb A1c No results for input(s): HGBA1C in the last 72 hours. Lipid Profile No results for input(s): CHOL, HDL, LDLCALC, TRIG, CHOLHDL, LDLDIRECT in the last 72 hours. Thyroid function studies No results for input(s): TSH, T4TOTAL, T3FREE, THYROIDAB in the last 72 hours.  Invalid input(s): FREET3 Anemia work up No results for input(s): VITAMINB12, FOLATE, FERRITIN, TIBC, IRON, RETICCTPCT in the last 72 hours. Urinalysis    Component Value Date/Time   COLORURINE YELLOW 07/20/2018 0045   APPEARANCEUR TURBID (A) 07/20/2018 0045   LABSPEC 1.012 07/20/2018 0045   PHURINE 9.0 (H) 07/20/2018 0045   GLUCOSEU 50 (A) 07/20/2018 0045   GLUCOSEU NEGATIVE 04/28/2017 1121   HGBUR NEGATIVE 07/20/2018 0045    BILIRUBINUR NEGATIVE 07/20/2018 0045   KETONESUR NEGATIVE 07/20/2018 0045   PROTEINUR >=300 (A) 07/20/2018 0045   UROBILINOGEN 0.2 04/28/2017 1121   NITRITE NEGATIVE 07/20/2018 0045   LEUKOCYTESUR LARGE (A) 07/20/2018 0045   Sepsis Labs Invalid input(s): PROCALCITONIN,  WBC,  LACTICIDVEN Microbiology Recent Results (from the past 240 hour(s))  Culture, blood (Routine X 2) w Reflex to ID Panel     Status: None (Preliminary result)   Collection Time: 07/20/18 12:45 AM   Specimen: BLOOD LEFT HAND  Result Value Ref Range Status   Specimen Description BLOOD LEFT HAND  Final   Special Requests   Final    BOTTLES DRAWN AEROBIC AND ANAEROBIC Blood Culture adequate volume   Culture   Final    NO GROWTH 3 DAYS Performed at [facility], [place] [address] [place], [place] [address]    Report Status PENDING  Incomplete  SARS Coronavirus 2 (CEPHEID- Performed in [place] hospital lab), Hosp Order     Status: None   Collection Time: 07/20/18 12:45 AM   Specimen: Nasopharyngeal Swab  Result Value Ref Range Status   SARS Coronavirus 2  NEGATIVE NEGATIVE Final    Comment: (NOTE) If result is NEGATIVE SARS-CoV-2 target nucleic acids are NOT DETECTED. The SARS-CoV-2 RNA is generally detectable in upper and lower  respiratory specimens during the acute phase of infection. The lowest  concentration of SARS-CoV-2 viral copies this assay can detect is 250  copies / mL. A negative result does not preclude SARS-CoV-2 infection  and should not be used as the sole basis for treatment or other  patient management decisions.  A negative result may occur with  improper specimen collection / handling, submission of specimen other  than nasopharyngeal swab, presence of viral mutation(s) within the  areas targeted by this assay, and inadequate number of viral copies  (<250 copies / mL). A negative result must be combined with clinical  observations, patient history, and epidemiological information. If  result is POSITIVE SARS-CoV-2 target nucleic acids are DETECTED. The SARS-CoV-2 RNA is generally detectable in upper and lower  respiratory specimens dur ing the acute phase of infection.  Positive  results are indicative of active infection with SARS-CoV-2.  Clinical  correlation with patient history and other diagnostic information is  necessary to determine patient infection status.  Positive results do  not rule out bacterial infection or co-infection with other viruses. If result is PRESUMPTIVE POSTIVE SARS-CoV-2 nucleic acids MAY BE PRESENT.   A presumptive positive result was obtained on the submitted specimen  and confirmed on repeat testing.  While 2019 novel coronavirus  (SARS-CoV-2) nucleic acids may be present in the submitted sample  additional confirmatory testing may be necessary for epidemiological  and / or clinical management purposes  to differentiate between  SARS-CoV-2 and other Sarbecovirus currently known to infect humans.  If clinically indicated additional testing with an alternate test  methodology [phone]) is advised. The SARS-CoV-2 RNA is generally  detectable in upper and lower respiratory sp ecimens during the acute  phase of infection. The expected result is Negative. Fact Sheet for Patients:  StrictlyIdeas.no Fact Sheet for Healthcare Providers: BankingDealers.co.za This test is not yet approved or cleared by the [place] FDA and has been authorized for detection and/or diagnosis of SARS-CoV-2 by FDA under an Emergency Use Authorization (EUA).  This EUA will remain in effect (meaning this test can be used) for the duration of the COVID-19 declaration under Section 564(b)(1) of the Act, 21 U.S.C. section 360bbb-3(b)(1), unless the authorization is terminated or revoked sooner. Performed at [facility], [place] [address], [place], [place] [address]   Urine Culture     Status: Abnormal    Collection Time: 07/20/18 12:56 AM   Specimen: Urine, Random  Result Value Ref Range Status   Specimen Description URINE, RANDOM  Final   Special Requests   Final    NONE Performed at [facility], [place] [address] [place], [place] [address]    Culture >=100,000 COLONIES/mL PROTEUS MIRABILIS (A)  Final   Report Status 07/22/2018 FINAL  Final   Organism ID, Bacteria PROTEUS MIRABILIS (A)  Final      Susceptibility   Proteus mirabilis - MIC*    AMPICILLIN 4 SENSITIVE Sensitive     CEFAZOLIN 8 SENSITIVE Sensitive     CEFTRIAXONE <=1 SENSITIVE Sensitive     CIPROFLOXACIN >=4 RESISTANT Resistant     GENTAMICIN <=1 SENSITIVE Sensitive     IMIPENEM 8 INTERMEDIATE Intermediate     NITROFURANTOIN 256 RESISTANT Resistant     TRIMETH/SULFA <=20 SENSITIVE Sensitive     AMPICILLIN/SULBACTAM <=2 SENSITIVE Sensitive  PIP/TAZO <=4 SENSITIVE Sensitive     * >=100,000 COLONIES/mL PROTEUS MIRABILIS  Culture, blood (Routine X 2) w Reflex to ID Panel     Status: None (Preliminary result)   Collection Time: 07/20/18  7:02 AM   Specimen: BLOOD LEFT HAND  Result Value Ref Range Status   Specimen Description BLOOD LEFT HAND  Final   Special Requests   Final    BOTTLES DRAWN AEROBIC AND ANAEROBIC Blood Culture adequate volume   Culture   Final    NO GROWTH 3 DAYS Performed at [facility], [address], [place], [place] [address]    Report Status PENDING  Incomplete  Culture, Urine     Status: None   Collection Time: 07/21/18  4:44 PM   Specimen: Urine, Catheterized  Result Value Ref Range Status   Specimen Description URINE, CATHETERIZED  Final   Special Requests maxipime, flagyl  Final   Culture   Final    NO GROWTH Performed at [facility], [address] [place], [place] [address]    Report Status 07/22/2018 FINAL  Final     Time coordinating discharge:  37 minutes  SIGNED:   Georgette Shell, MD  [facility] 07/23/2018, 8:15 AM Pager   If  7PM-7AM, please contact night-coverage [email or web address] Password [email or web address]

## 2018-07-23 NOTE — TOC Initial Note (Signed)
Transition of Care [facility]) - Initial/Assessment Note    Patient Details  Name: Angela Page MRN: [id number] Date of Birth: [birth year]  Transition of Care [facility] CM/SW Contact:    Gelene Mink, [place] Phone Number: 07/23/2018, 11:54 AM  Clinical Narrative:                CSW spoke with the patient at bedside. The patient is agreeable going back to [place]. The patient will be discharging today. She had no questions or concerns. The patient requested the CSW contact her mother once she is ready for discharge. CSW agreed.   CSW will assist with discharge.      Expected Discharge Plan: Skilled Nursing Facility Barriers to Discharge: No Barriers Identified   Patient Goals and CMS Choice Patient states their goals for this hospitalization and ongoing recovery are:: Pt wil return back to [facility].gov Compare Post Acute Care list provided to:: Patient Choice offered to / list presented to : NA  Expected Discharge Plan and Services Expected Discharge Plan: [place] In-house Referral: Clinical Social Work Discharge Planning Services: NA Post Acute Care Choice: [place] Living arrangements for the past 2 months: [place] Expected Discharge Date: 07/23/18               DME Arranged: N/A DME Agency: NA       HH Arranged: NA [place] Agency: NA        Prior Living Arrangements/Services Living arrangements for the past 2 months: [place] Lives with:: Facility Resident Patient language and need for interpreter reviewed:: No Do you feel safe going back to the place where you live?: Yes      Need for Family Participation in Patient Care: Yes (Comment) Care giver support system in place?: Yes (comment)   Criminal Activity/Legal Involvement Pertinent to Current Situation/Hospitalization: No - Comment as needed  Activities of Daily Living Home Assistive Devices/Equipment: Eyeglasses, Oxygen,  Feeding equipment, Enteral Feeding Supplies, Wheelchair ADL Screening (condition at time of admission) Patient's cognitive ability adequate to safely complete daily activities?: No Is the patient deaf or have difficulty hearing?: No Does the patient have difficulty seeing, even when wearing glasses/contacts?: No Does the patient have difficulty concentrating, remembering, or making decisions?: Yes Patient able to express need for assistance with ADLs?: No Does the patient have difficulty dressing or bathing?: No Independently performs ADLs?: No Communication: Needs assistance Is this a change from baseline?: Pre-admission baseline Dressing (OT): Needs assistance Is this a change from baseline?: Pre-admission baseline Grooming: Needs assistance Is this a change from baseline?: Pre-admission baseline Feeding: Dependent Is this a change from baseline?: Pre-admission baseline Bathing: Dependent Is this a change from baseline?: Pre-admission baseline Toileting: Dependent Is this a change from baseline?: Pre-admission baseline In/Out Bed: Dependent Is this a change from baseline?: Pre-admission baseline Walks in Home: Dependent Is this a change from baseline?: Pre-admission baseline Does the patient have difficulty walking or climbing stairs?: Yes Weakness of Legs: Both Weakness of Arms/Hands: Both  Permission Sought/Granted Permission sought to share information with : Case Manager Permission granted to share information with : Yes, Verbal Permission Granted  Share Information with NAME: [name]  Permission granted to share info w AGENCY: [employer] granted to share info w Relationship: Mother     Emotional Assessment Appearance:: Appears stated age Attitude/Demeanor/Rapport: Engaged Affect (typically observed): Calm Orientation: : Oriented to Self, Oriented to Place, Oriented to  Time, Oriented to Situation Alcohol /  Substance Use: Not Applicable Psych  Involvement: No (comment)  Admission diagnosis:  Altered Mental Status  Patient Active Problem List   Diagnosis Date Noted  . Acute encephalopathy 07/20/2018  . Hypercalcemia 06/23/2018  . Thrombocytosis ([place]) 06/22/2018  . Chronic respiratory failure with hypoxia ([place])   . Acute on chronic respiratory failure ([place])   . Quadriplegia and quadriparesis ([place])   . At high risk for severe sepsis   . Decubitus ulcer of buttock, unstageable ([place])   . Lactic acid acidosis 12/16/2017  . Suprapubic catheter ([place]) 12/16/2017  . Pressure injury of skin 07/17/2017  . Kidney stone 07/16/2017  . Right ureteral stone 03/10/2017  . Complicated UTI (urinary tract infection) 03/09/2017  . Ineffective airway clearance   . Sepsis secondary to UTI ([place]) 06/30/2016  . Autonomic neuropathy 06/10/2016  . Presence of permanent cardiac pacemaker 06/10/2016  . 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 Chronically on opiate therapy 06/10/2016  . PEG (percutaneous endoscopic gastrostomy) status ([place]) 06/10/2016  . Pulmonary hypertension ([place]) 06/10/2016  . Hypoalbuminemia 06/10/2016  . Peripheral edema   . Tracheostomy dependence ([place])   . Chronic obstructive pulmonary disease ([place])   . Chronic diastolic CHF (congestive heart failure) ([place]) 01/23/2016  . Tracheostomy status ([place])   . Esophageal dysphagia   . Malnutrition of moderate degree 11/01/2015  . Tobacco abuse 10/31/2015  . Asthma 10/31/2015  . History of pneumonia 10/08/2015  . 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 Normocytic normochromic anemia 02/25/2015  . Altered mental status 10/29/2014  . Acute metabolic encephalopathy 27/06/2374  . Leukocytosis 10/23/2014  . Hypokalemia 10/23/2014  . Decubitus ulcer of sacral region, stage 4 ([place]) 10/23/2014  . Anemia, iron deficiency 10/23/2014  . Quadriplegia, C5-C7 incomplete ([place]) 10/22/2014  . Depression   . Protein-calorie  malnutrition, severe ([place]) 09/05/2014  . 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 Recurrent UTI 03/31/2014  . Pressure ulcer of coccygeal region, stage 4 ([place]) 01/06/2014  . Stage 4 skin ulcer of sacral region ([place]) 01/06/2014  . Neuropathic pain 11/30/2013  . Paraplegia following spinal cord injury ([place]) 11/30/2013  . PEG (percutaneous endoscopic gastrostomy) adjustment/replacement/removal ([place]) 11/30/2013  . Spinal cord injury, cervical region ([place]) 11/30/2013  . Chronic pain due to injury 11/30/2013  . S/P cervical spinal fusion 10/27/2013  . Tracheostomy care ([place]) 10/27/2013  . 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 SCI (spinal cord injury) 10/11/2013   PCP:  Default, Provider, MD Pharmacy:  No Pharmacies Listed    Social Determinants of Health (SDOH) Interventions    Readmission Risk Interventions Readmission Risk Prevention Plan 06/24/2018  Transportation Screening Complete  Medication Review Press photographer) Complete  HRI or [place] Complete  SW Recovery Care/Counseling Consult Complete  Palliative Care Screening Not Applicable  Skilled Nursing Facility Complete  Some recent data might be hidden

## 2018-07-25 LAB — CULTURE, BLOOD (ROUTINE X 2)
Culture: NO GROWTH
Culture: NO GROWTH
Special Requests: ADEQUATE
Special Requests: ADEQUATE

## 2018-08-04 IMAGING — DX DG CHEST 1V PORT
2 series · 2 of 2 positions shown · non-contrast
Comparison: 06/09/2016

CLINICAL DATA: Cough.  Quadriplegic with tracheostomy tube.

EXAM:
PORTABLE CHEST 1 VIEW

[chest ap (1 of 2)]
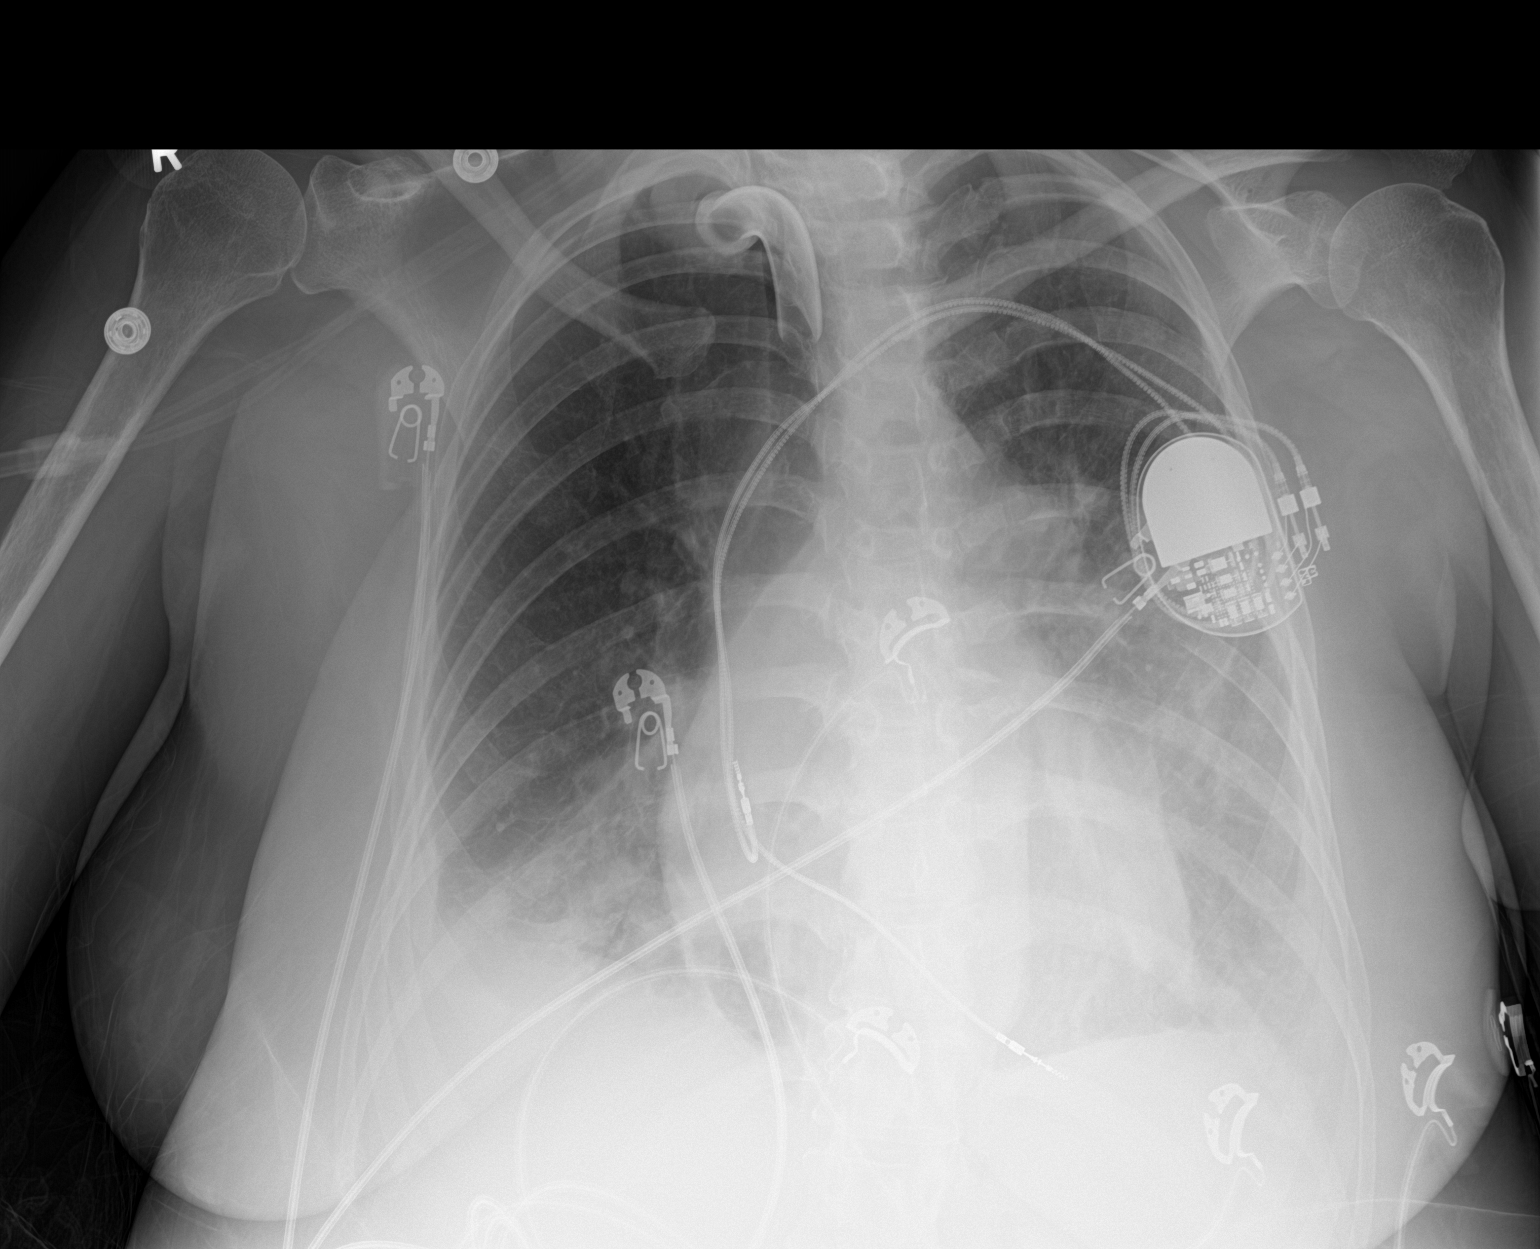

[chest ap (2 of 2)]
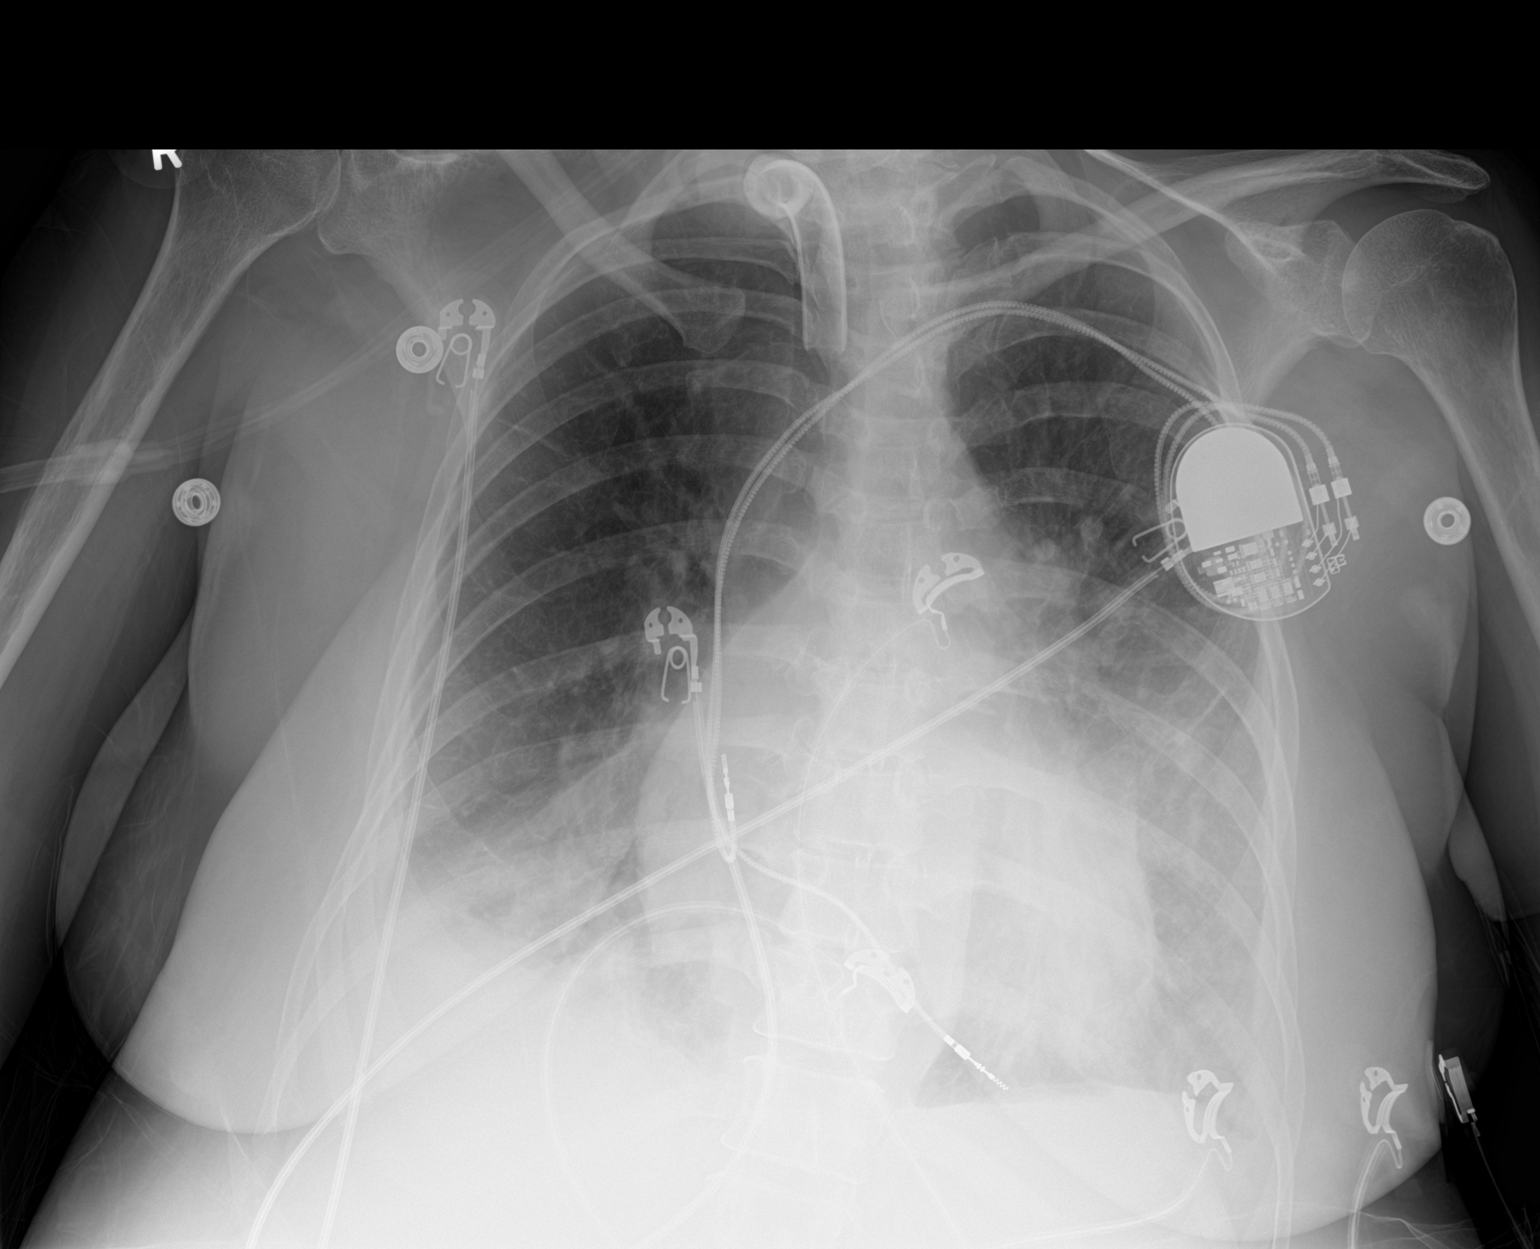

[2 of 2 positions shown; findings below may reference images not displayed]

FINDINGS: Stable position of the left dual chamber cardiac pacemaker. Heart
size is within normal limits and stable. Patient is slightly rotated
towards the right on these images. Tracheostomy tube is present.
Surgical hardware in the lower cervical spine. Persistent densities
at the right chest base compatible with volume loss and
consolidation. Cannot exclude right pleural fluid. There is concern
for increased densities at the left lung base. Upper lungs remain
clear.
IMPRESSION: Bibasilar chest disease with progression at the left lung base.
Findings could represent a combination of atelectasis and airspace
disease. There is also concern for some right pleural fluid.

## 2018-08-10 ENCOUNTER — Inpatient Hospital Stay (HOSPITAL_COMMUNITY): Payer: Medicare Other

## 2018-08-10 ENCOUNTER — Inpatient Hospital Stay (HOSPITAL_COMMUNITY)
Admission: EM | Admit: 2018-08-10 | Discharge: 2018-08-24 | DRG: 388 | Disposition: A | Discharge status: Skilled Nursing Facility | Payer: Medicare Other | Attending: Internal Medicine | Admitting: Internal Medicine

## 2018-08-10 ENCOUNTER — Emergency Department (HOSPITAL_COMMUNITY): Payer: Medicare Other

## 2018-08-10 ENCOUNTER — Encounter (HOSPITAL_COMMUNITY): Payer: Self-pay | Admitting: *Deleted

## 2018-08-10 ENCOUNTER — Other Ambulatory Visit: Payer: Self-pay

## 2018-08-10 DIAGNOSIS — Z880 Allergy status to penicillin: Secondary | ICD-10-CM

## 2018-08-10 DIAGNOSIS — E872 Acidosis: Secondary | ICD-10-CM | POA: Diagnosis not present

## 2018-08-10 DIAGNOSIS — G8250 Quadriplegia, unspecified: Secondary | ICD-10-CM | POA: Diagnosis present

## 2018-08-10 DIAGNOSIS — K567 Ileus, unspecified: Secondary | ICD-10-CM | POA: Diagnosis present

## 2018-08-10 DIAGNOSIS — T45515A Adverse effect of anticoagulants, initial encounter: Secondary | ICD-10-CM | POA: Diagnosis present

## 2018-08-10 DIAGNOSIS — Z794 Long term (current) use of insulin: Secondary | ICD-10-CM | POA: Diagnosis not present

## 2018-08-10 DIAGNOSIS — S301XXA Contusion of abdominal wall, initial encounter: Secondary | ICD-10-CM | POA: Diagnosis not present

## 2018-08-10 DIAGNOSIS — R509 Fever, unspecified: Secondary | ICD-10-CM

## 2018-08-10 DIAGNOSIS — G4700 Insomnia, unspecified: Secondary | ICD-10-CM | POA: Diagnosis present

## 2018-08-10 DIAGNOSIS — Z0189 Encounter for other specified special examinations: Secondary | ICD-10-CM

## 2018-08-10 DIAGNOSIS — I9589 Other hypotension: Secondary | ICD-10-CM | POA: Diagnosis present

## 2018-08-10 DIAGNOSIS — Z950 Presence of cardiac pacemaker: Secondary | ICD-10-CM

## 2018-08-10 DIAGNOSIS — Z930 Tracheostomy status: Secondary | ICD-10-CM | POA: Diagnosis not present

## 2018-08-10 DIAGNOSIS — L89159 Pressure ulcer of sacral region, unspecified stage: Secondary | ICD-10-CM | POA: Diagnosis present

## 2018-08-10 DIAGNOSIS — M7981 Nontraumatic hematoma of soft tissue: Secondary | ICD-10-CM | POA: Diagnosis present

## 2018-08-10 DIAGNOSIS — Z789 Other specified health status: Secondary | ICD-10-CM | POA: Diagnosis not present

## 2018-08-10 DIAGNOSIS — Z931 Gastrostomy status: Secondary | ICD-10-CM | POA: Diagnosis not present

## 2018-08-10 DIAGNOSIS — K56609 Unspecified intestinal obstruction, unspecified as to partial versus complete obstruction: Secondary | ICD-10-CM | POA: Diagnosis present

## 2018-08-10 DIAGNOSIS — N390 Urinary tract infection, site not specified: Secondary | ICD-10-CM | POA: Diagnosis present

## 2018-08-10 DIAGNOSIS — D62 Acute posthemorrhagic anemia: Secondary | ICD-10-CM | POA: Diagnosis present

## 2018-08-10 DIAGNOSIS — E876 Hypokalemia: Secondary | ICD-10-CM | POA: Diagnosis not present

## 2018-08-10 DIAGNOSIS — Z885 Allergy status to narcotic agent status: Secondary | ICD-10-CM

## 2018-08-10 DIAGNOSIS — Z801 Family history of malignant neoplasm of trachea, bronchus and lung: Secondary | ICD-10-CM | POA: Diagnosis not present

## 2018-08-10 DIAGNOSIS — G8254 Quadriplegia, C5-C7 incomplete: Secondary | ICD-10-CM | POA: Diagnosis present

## 2018-08-10 DIAGNOSIS — K219 Gastro-esophageal reflux disease without esophagitis: Secondary | ICD-10-CM | POA: Diagnosis present

## 2018-08-10 DIAGNOSIS — F329 Major depressive disorder, single episode, unspecified: Secondary | ICD-10-CM | POA: Diagnosis present

## 2018-08-10 DIAGNOSIS — Z800 Family history of malignant neoplasm of digestive organs: Secondary | ICD-10-CM

## 2018-08-10 DIAGNOSIS — J9611 Chronic respiratory failure with hypoxia: Secondary | ICD-10-CM | POA: Diagnosis present

## 2018-08-10 DIAGNOSIS — R109 Unspecified abdominal pain: Secondary | ICD-10-CM

## 2018-08-10 DIAGNOSIS — D649 Anemia, unspecified: Secondary | ICD-10-CM | POA: Diagnosis not present

## 2018-08-10 DIAGNOSIS — E119 Type 2 diabetes mellitus without complications: Secondary | ICD-10-CM | POA: Diagnosis present

## 2018-08-10 DIAGNOSIS — J449 Chronic obstructive pulmonary disease, unspecified: Secondary | ICD-10-CM | POA: Diagnosis present

## 2018-08-10 DIAGNOSIS — F5105 Insomnia due to other mental disorder: Secondary | ICD-10-CM | POA: Diagnosis present

## 2018-08-10 DIAGNOSIS — Z881 Allergy status to other antibiotic agents status: Secondary | ICD-10-CM

## 2018-08-10 DIAGNOSIS — Z87891 Personal history of nicotine dependence: Secondary | ICD-10-CM

## 2018-08-10 DIAGNOSIS — Z8249 Family history of ischemic heart disease and other diseases of the circulatory system: Secondary | ICD-10-CM | POA: Diagnosis not present

## 2018-08-10 DIAGNOSIS — Z20828 Contact with and (suspected) exposure to other viral communicable diseases: Secondary | ICD-10-CM | POA: Diagnosis present

## 2018-08-10 LAB — COMPREHENSIVE METABOLIC PANEL
ALT: 16 U/L (ref 0–44)
AST: 18 U/L (ref 15–41)
Albumin: 3.4 g/dL — ABNORMAL LOW (ref 3.5–5.0)
Alkaline Phosphatase: 157 U/L — ABNORMAL HIGH (ref 38–126)
Anion gap: 15 (ref 5–15)
BUN: 17 mg/dL (ref 6–20)
CO2: 28 mmol/L (ref 22–32)
Calcium: 9.7 mg/dL (ref 8.9–10.3)
Chloride: 92 mmol/L — ABNORMAL LOW (ref 98–111)
Creatinine, Ser: 0.54 mg/dL (ref 0.44–1.00)
GFR calc Af Amer: >60 mL/min (ref >60)
GFR calc non Af Amer: >60 mL/min (ref >60)
Glucose, Bld: 178 mg/dL — ABNORMAL HIGH (ref 70–99)
Potassium: 4.2 mmol/L (ref 3.5–5.1)
Sodium: 135 mmol/L (ref 135–145)
Total Bilirubin: 0.7 mg/dL (ref 0.3–1.2)
Total Protein: 8.7 g/dL — ABNORMAL HIGH (ref 6.5–8.1)

## 2018-08-10 LAB — CBC WITH DIFFERENTIAL/PLATELET
Abs Immature Granulocytes: 0.05 10*3/uL (ref 0.00–0.07)
Basophils Absolute: 0 10*3/uL (ref 0.0–0.1)
Basophils Relative: 0 %
Eosinophils Absolute: 0 10*3/uL (ref 0.0–0.5)
Eosinophils Relative: 0 %
HCT: 23.7 % — ABNORMAL LOW (ref 36.0–46.0)
Hemoglobin: 7.1 g/dL — ABNORMAL LOW (ref 12.0–15.0)
Immature Granulocytes: 0 %
Lymphocytes Relative: 9 %
Lymphs Abs: 1.2 10*3/uL (ref 0.7–4.0)
MCH: 27.4 pg (ref 26.0–34.0)
MCHC: 30 g/dL (ref 30.0–36.0)
MCV: 91.5 fL (ref 80.0–100.0)
Monocytes Absolute: 0.7 10*3/uL (ref 0.1–1.0)
Monocytes Relative: 5 %
Neutro Abs: 11.4 10*3/uL — ABNORMAL HIGH (ref 1.7–7.7)
Neutrophils Relative %: 86 %
Platelets: 470 10*3/uL — ABNORMAL HIGH (ref 150–400)
RBC: 2.59 MIL/uL — ABNORMAL LOW (ref 3.87–5.11)
RDW: 16.1 % — ABNORMAL HIGH (ref 11.5–15.5)
WBC: 13.3 10*3/uL — ABNORMAL HIGH (ref 4.0–10.5)
nRBC: 0.2 % (ref 0.0–0.2)

## 2018-08-10 LAB — URINALYSIS, ROUTINE W REFLEX MICROSCOPIC
Bilirubin Urine: NEGATIVE
Glucose, UA: NEGATIVE mg/dL
Hgb urine dipstick: NEGATIVE
Ketones, ur: NEGATIVE mg/dL
Nitrite: NEGATIVE
Protein, ur: 100 mg/dL — AB
RBC / HPF: >50 RBC/hpf — ABNORMAL HIGH (ref 0–5)
Specific Gravity, Urine: >1.046 — ABNORMAL HIGH (ref 1.005–1.030)
WBC, UA: >50 WBC/hpf — ABNORMAL HIGH (ref 0–5)
pH: 5 (ref 5.0–8.0)

## 2018-08-10 LAB — IRON AND TIBC
Iron: 29 ug/dL (ref 28–170)
Saturation Ratios: 9 % — ABNORMAL LOW (ref 10.4–31.8)
TIBC: 313 ug/dL (ref 250–450)
UIBC: 284 ug/dL

## 2018-08-10 LAB — CBG MONITORING, ED: Glucose-Capillary: 115 mg/dL — ABNORMAL HIGH (ref 70–99)

## 2018-08-10 LAB — VITAMIN B12: Vitamin B-12: 493 pg/mL (ref 180–914)

## 2018-08-10 LAB — I-STAT BETA HCG BLOOD, ED (MC, WL, AP ONLY): I-stat hCG, quantitative: 29.2 m[IU]/mL — ABNORMAL HIGH (ref <5)

## 2018-08-10 LAB — FERRITIN: Ferritin: 84 ng/mL (ref 11–307)

## 2018-08-10 LAB — LACTIC ACID, PLASMA: Lactic Acid, Venous: 1.1 mmol/L (ref 0.5–1.9)

## 2018-08-10 LAB — SARS CORONAVIRUS 2 BY RT PCR (HOSPITAL ORDER, PERFORMED IN ~~LOC~~ HOSPITAL LAB): SARS Coronavirus 2: NEGATIVE

## 2018-08-10 LAB — FOLATE: Folate: 24 ng/mL (ref >5.9)

## 2018-08-10 LAB — LIPASE, BLOOD: Lipase: 19 U/L (ref 11–51)

## 2018-08-10 LAB — PREPARE RBC (CROSSMATCH)

## 2018-08-10 LAB — HCG, QUANTITATIVE, PREGNANCY: hCG, Beta Chain, Quant, S: 4 m[IU]/mL (ref <5)

## 2018-08-10 MED ORDER — BACLOFEN 10 MG PO TABS
5.0000 mg | ORAL_TABLET | Freq: Three times a day (TID) | ORAL | Status: DC | PRN
  Administered 2018-08-14 – 2018-08-19 (×3): 5 mg via ORAL
  Filled 2018-08-10 (×3): qty 1

## 2018-08-10 MED ORDER — SODIUM CHLORIDE 0.9% IV SOLUTION: Freq: Once | INTRAVENOUS | Status: DC

## 2018-08-10 MED ORDER — SODIUM CHLORIDE 0.9 % IV BOLUS
1000.0000 mL | Freq: Once | INTRAVENOUS | Status: AC
  Administered 2018-08-10: 1000 mL via INTRAVENOUS

## 2018-08-10 MED ORDER — DIATRIZOATE MEGLUMINE & SODIUM 66-10 % PO SOLN
90.0000 mL | Freq: Once | ORAL | Status: AC
  Administered 2018-08-11: 90 mL via NASOGASTRIC
  Filled 2018-08-10: qty 90

## 2018-08-10 MED ORDER — ONDANSETRON HCL 4 MG/2ML IJ SOLN
4.0000 mg | Freq: Once | INTRAMUSCULAR | Status: AC
  Administered 2018-08-10: 4 mg via INTRAVENOUS
  Filled 2018-08-10: qty 2

## 2018-08-10 MED ORDER — DULOXETINE HCL 30 MG PO CPEP
30.0000 mg | ORAL_CAPSULE | Freq: Two times a day (BID) | ORAL | Status: DC
  Administered 2018-08-14 – 2018-08-24 (×21): 30 mg via ORAL
  Filled 2018-08-10 (×22): qty 1

## 2018-08-10 MED ORDER — SODIUM CHLORIDE 0.9 % IV SOLN
2.0000 g | Freq: Once | INTRAVENOUS | Status: AC
  Administered 2018-08-10: 2 g via INTRAVENOUS
  Filled 2018-08-10: qty 2

## 2018-08-10 MED ORDER — BUPROPION HCL ER (SR) 150 MG PO TB12
150.0000 mg | ORAL_TABLET | Freq: Every day | ORAL | Status: DC
  Administered 2018-08-14 – 2018-08-24 (×11): 150 mg via ORAL
  Filled 2018-08-10 (×12): qty 1

## 2018-08-10 MED ORDER — SODIUM CHLORIDE (PF) 0.9 % IJ SOLN
INTRAMUSCULAR | Status: AC
  Filled 2018-08-10: qty 50

## 2018-08-10 MED ORDER — IOHEXOL 300 MG/ML  SOLN
100.0000 mL | Freq: Once | INTRAMUSCULAR | Status: AC | PRN
  Administered 2018-08-10: 80 mL via INTRAVENOUS

## 2018-08-10 MED ORDER — ZOLPIDEM TARTRATE 5 MG PO TABS
2.5000 mg | ORAL_TABLET | Freq: Every day | ORAL | Status: DC
  Administered 2018-08-14 – 2018-08-23 (×10): 2.5 mg via ORAL
  Filled 2018-08-10 (×10): qty 1

## 2018-08-10 MED ORDER — MORPHINE SULFATE (PF) 2 MG/ML IV SOLN
1.0000 mg | INTRAVENOUS | Status: DC | PRN
  Administered 2018-08-10 – 2018-08-13 (×13): 1 mg via INTRAVENOUS
  Filled 2018-08-10 (×14): qty 1

## 2018-08-10 MED ORDER — MORPHINE SULFATE (PF) 4 MG/ML IV SOLN
4.0000 mg | Freq: Once | INTRAVENOUS | Status: AC
  Administered 2018-08-10: 4 mg via INTRAVENOUS
  Filled 2018-08-10: qty 1

## 2018-08-10 MED ORDER — INSULIN ASPART 100 UNIT/ML ~~LOC~~ SOLN
0.0000 [IU] | SUBCUTANEOUS | Status: DC
  Administered 2018-08-11 – 2018-08-18 (×14): 1 [IU] via SUBCUTANEOUS
  Administered 2018-08-19: 2 [IU] via SUBCUTANEOUS
  Administered 2018-08-19 – 2018-08-20 (×6): 1 [IU] via SUBCUTANEOUS
  Administered 2018-08-21: 2 [IU] via SUBCUTANEOUS
  Administered 2018-08-21 – 2018-08-22 (×4): 1 [IU] via SUBCUTANEOUS
  Administered 2018-08-22 – 2018-08-23 (×2): 2 [IU] via SUBCUTANEOUS
  Administered 2018-08-23 (×2): 1 [IU] via SUBCUTANEOUS
  Administered 2018-08-23: 2 [IU] via SUBCUTANEOUS
  Administered 2018-08-24 (×2): 1 [IU] via SUBCUTANEOUS
  Filled 2018-08-10: qty 0.09

## 2018-08-10 MED ORDER — BACLOFEN 10 MG PO TABS: 5.0000 mg | ORAL_TABLET | Freq: Three times a day (TID) | ORAL | Status: DC

## 2018-08-10 MED ORDER — GABAPENTIN 300 MG PO CAPS
300.0000 mg | ORAL_CAPSULE | Freq: Three times a day (TID) | ORAL | Status: DC
  Administered 2018-08-14 – 2018-08-24 (×31): 300 mg via ORAL
  Filled 2018-08-10 (×33): qty 1

## 2018-08-10 MED ORDER — UMECLIDINIUM BROMIDE 62.5 MCG/INH IN AEPB
1.0000 | INHALATION_SPRAY | Freq: Every day | RESPIRATORY_TRACT | Status: DC
  Administered 2018-08-11 – 2018-08-19 (×7): 1 via RESPIRATORY_TRACT
  Filled 2018-08-10 (×2): qty 7

## 2018-08-10 MED ORDER — LACTATED RINGERS IV SOLN
INTRAVENOUS | Status: AC
  Administered 2018-08-10: 23:00:00 via INTRAVENOUS

## 2018-08-10 MED ORDER — SODIUM CHLORIDE 0.9 % IV SOLN
1.0000 g | INTRAVENOUS | Status: DC
  Administered 2018-08-11 – 2018-08-16 (×6): 1 g via INTRAVENOUS
  Filled 2018-08-10 (×5): qty 1
  Filled 2018-08-10: qty 10

## 2018-08-10 MED ORDER — LORAZEPAM 2 MG/ML IJ SOLN
1.0000 mg | Freq: Once | INTRAMUSCULAR | Status: AC
  Administered 2018-08-11: 1 mg via INTRAVENOUS
  Filled 2018-08-10: qty 1

## 2018-08-10 MED ORDER — ONDANSETRON HCL 4 MG/2ML IJ SOLN
4.0000 mg | Freq: Four times a day (QID) | INTRAMUSCULAR | Status: DC | PRN
  Administered 2018-08-11 – 2018-08-15 (×5): 4 mg via INTRAVENOUS
  Filled 2018-08-10 (×5): qty 2

## 2018-08-10 NOTE — ED Triage Notes (Signed)
Pt bib EMS and coming from [place] and presents with abd pain that began this morning around 3 or 4 am.  Pt had a hard BM yesterday and reports nausea and had one episode of emesis when EMS moved pt onto their stretcher for transport.  Pt reports her abdomen is more distended today.  Rates pain a 6 out 10.  Pt is a trach pt who is on 3L. Abd xray from facility was inconclusive and pt had a hemoglobin of 6.4 at the facility. Pt also has a chronic foley and is a quadriplegic.

## 2018-08-10 NOTE — H&P (Addendum)
History and Physical    Angela Page WJX:[id number] DOB: 06/02/1970 DOA: 08/10/2018  PCP: Default, Provider, MD  Patient coming from: home  I have personally briefly reviewed patient's old medical records in [place]  Chief Complaint: abdominal pain  HPI: Angela Page is Angela Page 48 y.o. female with medical history significant of traumatic C spine fracture resulting in quadriplegia s/p trach, peg, suprapubic catheter as well as COPD, chronic hypotension, T2DM, chronic pain, SSS s/p pacemaker, and chronic decubitus ulcers presenting with abdominal pain.  She notes he sx started last night after tube feeds were starting.  She developed worsening abdominal pain, nausea, and distension.  She presented to the ED due to worsening abdominal sx.  No vomiting until in ED.  No F, chills, CP, SOB.  Continues to have BM.  No known COVID exposures.   ED Course: Imaging concerning for SBO and rectus sheath hematoma.  Labs with anemia.  Surgery c/s.  Labs, imaging.  Abx for possible UTI.  Hospitalist to admit for SBO, rectus sheath hematoma, and possible UTI.   Review of Systems: As per HPI otherwise 10 point review of systems negative.   Past Medical History:  Diagnosis Date   Acute on chronic respiratory failure with hypoxia ([place])    Anasarca 06/10/2016   Asthma    At high risk for severe sepsis    Cocaine use 10/08/2013   Decubitus ulcer of buttock, unstageable ([place])    Depression    Diabetes mellitus without complication (HCC)    GERD (gastroesophageal reflux disease)    HCAP (healthcare-associated pneumonia) 06/09/2016   Hepatitis    hx of hepatits frm mono    Kidney stone    Lobar pneumonia (HCC)    Overdose of opiate or related narcotic ([place]) 09/02/2014   Pacemaker    Pleurisy    Polysubstance dependence including opioid type drug, episodic abuse ([place]) 10/27/2013   Protein calorie malnutrition ([place]) 10/31/2015   Quadriparesis ([place])    Quadriplegia and  quadriparesis ([place])    Quadriplegia, C5-C7 incomplete ([place]) 10/08/2013   Stage IV pressure ulcer of sacral region ([place]) 10/31/2015   Tracheostomy status ([place])     Past Surgical History:  Procedure Laterality Date   APPENDECTOMY     BACK SURGERY  10/08/2013   fusion of spine, cervical region   [place] Right 03/09/2017   Procedure: CYSTOSCOPY [name] STENT PLACEMENT;  Surgeon: Angela Seal, MD;  Location: [facility];  Service: Urology;  Laterality: Right;   CYSTOSCOPY W/ URETERAL STENT REMOVAL Right 07/16/2017   Procedure: CYSTOSCOPY WITH STENT REMOVAL;  Surgeon: Angela Gallo, MD;  Location: [facility];  Service: Urology;  Laterality: Right;   CYSTOSCOPY/URETEROSCOPY/HOLMIUM LASER/STENT PLACEMENT Right 07/16/2017   Procedure: CYSTOSCOPY/URETEROSCOPY/HOLMIUM LASER/STENT PLACEMENT/ALSO STONE EXTRACTION;  Surgeon: Angela Gallo, MD;  Location: [facility];  Service: Urology;  Laterality: Right;   GASTROSTOMY  11/23/2015   I&D EXTREMITY  10/28/2011   Procedure: IRRIGATION AND DEBRIDEMENT EXTREMITY;  Surgeon: Angela Must, MD;  Location: [facility];  Service: Orthopedics;  Laterality: Right;  Irrigation and debridement right middle finger   INSERTION OF SUPRAPUBIC CATHETER N/Angela Page 07/28/2016   Procedure: INSERTION OF SUPRAPUBIC CATHETER;  Surgeon: Angela Gallo, MD;  Location: [facility];  Service: Urology;  Laterality: N/Angela Page;   IR GENERIC HISTORICAL  11/23/2015   IR GASTROSTOMY TUBE MOD SED 11/23/2015 [facility]   IR PATIENT EVAL TECH 0-60 MINS  12/18/2017   IR  REPLACE G-TUBE SIMPLE WO FLUORO  12/19/2017   LITHOTRIPSY  08/2017   TRACHEOSTOMY       reports that she has quit smoking. Her smoking use included cigarettes. She has Angela Page 25.00 pack-year smoking history. She has never used smokeless tobacco. She reports that she does not drink alcohol or use drugs.  Allergies  Allergen Reactions   Penicillins Hives    Has  patient had Angela Page PCN reaction causing immediate rash, facial/tongue/throat swelling, SOB or lightheadedness with hypotension: Yes Has patient had Angela Page PCN reaction causing severe rash involving mucus membranes or skin necrosis: Yes Has patient had Angela Page PCN reaction that required hospitalization No Has patient had Angela Page PCN reaction occurring within the last 10 years: No-childhood allergy If all of the above answers are "NO", then may proceed with Cephalosporin  Tolerated cephalosporins in past   Erythromycin Nausea And Vomiting   Nitrofurantoin Monohyd Macro Nausea And Vomiting   Oxybutynin Other (See Comments)    Dries mouth out     Family History  Problem Relation Age of Onset   Hypertension Maternal Uncle    Kidney failure Maternal Uncle    Colon cancer Mother    Lung cancer Father    Hypertension Brother    Kidney failure Maternal Aunt    Kidney failure Maternal Uncle    Colon cancer Maternal Grandfather    Esophageal cancer Neg Hx    Prior to Admission medications   Medication Sig Start Date End Date Taking? Authorizing Provider  buPROPion (WELLBUTRIN SR) 150 MG 12 hr tablet Take 150 mg by mouth daily.   Yes [provider]  Dulaglutide (TRULICITY) 1.5 VH/8.4ON SOPN Inject 1.5 mg into the skin every Friday.   Yes [provider]  DULoxetine (CYMBALTA) 30 MG capsule Take 1 capsule (30 mg total) by mouth 2 (two) times daily. 02/17/18  Yes Angela, Suann Larry, MD  enoxaparin (LOVENOX) 40 MG/0.4ML injection Inject 40 mg into the skin daily.   Yes [provider]  famotidine (PEPCID) 20 MG tablet Take 20 mg by mouth at bedtime.   Yes [provider]  ferrous sulfate 325 (65 FE) MG tablet Take 325 mg by mouth daily with breakfast.   Yes [provider]  Insulin Glargine (BASAGLAR KWIKPEN) 100 UNIT/ML SOPN Inject 0.05 mLs (5 Units total) into the skin at bedtime. 04/11/18  Yes Kayleen Memos, DO  levofloxacin (LEVAQUIN) 750 MG tablet Take  750 mg by mouth daily.   Yes [provider]  loratadine (CLARITIN) 10 MG tablet Take 10 mg by mouth daily.    Yes [provider]  polyethylene glycol (MIRALAX / GLYCOLAX) packet Take 17 g by mouth daily. Hold if diarrhea. 01/01/18  Yes Arrien, Jimmy Picket, MD  tiotropium (SPIRIVA) 18 MCG inhalation capsule Place 18 mcg into inhaler and inhale daily.   Yes [provider]  zolpidem (AMBIEN) 5 MG tablet Take 2.5 mg by mouth at bedtime.   Yes [provider]  ferrous sulfate 300 (60 Fe) MG/5ML syrup Place 3.7 mLs (220 mg total) into feeding tube daily with breakfast. Patient not taking: Reported on 08/10/2018 04/11/18   Kayleen Memos, DO  gabapentin (NEURONTIN) 300 MG capsule Take 300 mg by mouth 3 (three) times daily.    [provider]  insulin aspart (NOVOLOG) 100 UNIT/ML injection Inject 10 Units into the skin every 4 (four) hours. For glucose 150 to 200 use 1 units, for glucose 201-250 use 2 units, for 251 to 300 use  4 units, for 301 to 350 use 6 units, for 351 to 400 use 8 units, for 401 or greater use 10 units. Patient taking differently: Inject 0-10 Units into the skin See admin instructions. Check FSBS every 6 hours. Give insulin according to sliding scale:  0-150 0 units 151-200 1 units 201-250 2 units 251-300 4 units 301-350 6 units 351-400 8 units >400 call MD 12/31/17   Arrien, Jimmy Picket, MD  lip balm (CARMEX) ointment Apply topically as needed for lip care. 06/27/18   Georgette Shell, MD  metoCLOPramide (REGLAN) 5 MG tablet Place 5 mg into feeding tube 4 (four) times daily.     [provider]  midodrine (PROAMATINE) 10 MG tablet Take 10 mg by mouth 3 (three) times daily as needed (hypotension).     [provider]  Nutritional Supplements (FEEDING SUPPLEMENT, GLUCERNA 1.2 CAL,) LIQD Place 1,000 mLs into feeding tube daily. Patient taking differently: Place 1,000 mLs into feeding tube See admin instructions.  On at 1400 and off at 1000 11mL/hr 02/18/18   Angela, Suann Larry, MD  Nutritional Supplements (PROMOD) LIQD Take 30 mLs by mouth 3 (three) times daily.    [provider]  OXYGEN Inhale 3 L into the lungs continuous.    [provider]    Physical Exam: Vitals:   08/10/18 1412 08/10/18 1630 08/10/18 1853  BP: 138/89 110/72 116/76  Pulse: 93 93 87  Resp: 18 15 15   Temp: 99.2 F (37.3 C)    TempSrc: Oral    SpO2: 100% 100% 100%    Constitutional: NAD, calm, comfortable Vitals:   08/10/18 1412 08/10/18 1630 08/10/18 1853  BP: 138/89 110/72 116/76  Pulse: 93 93 87  Resp: 18 15 15   Temp: 99.2 F (37.3 C)    TempSrc: Oral    SpO2: 100% 100% 100%   Eyes: PERRL, lids and conjunctivae normal ENMT: Mucous membranes are moist. Posterior pharynx clear of any exudate or lesions.Normal dentition.  Neck: normal, supple, no masses, no thyromegaly Respiratory: clear to auscultation bilaterally, no wheezing, no crackles.  Trach. Cardiovascular: Regular rate and rhythm Abdomen: distended, mildly tender.  Gtube and suprapubic catheter in place. Musculoskeletal: contractures noted  Skin: unable to examine sacral decub as pt had soiled herself and needed to be cleaned Neurologic: CN 2-12 grossly intact. Quadriplegic.  Psychiatric: Normal judgment and insight. Alert and oriented x 3. Normal mood.   Labs on Admission: I have personally reviewed following labs and imaging studies  CBC: Recent Labs  Lab 08/10/18 1440  WBC 13.3*  NEUTROABS 11.4*  HGB 7.1*  HCT 23.7*  MCV 91.5  PLT 998*   Basic Metabolic Panel: Recent Labs  Lab 08/10/18 1440  NA 135  K 4.2  CL 92*  CO2 28  GLUCOSE 178*  BUN 17  CREATININE 0.54  CALCIUM 9.7   GFR: CrCl cannot be calculated (Unknown ideal weight.). Liver Function Tests: Recent Labs  Lab 08/10/18 1440  AST 18  ALT 16  ALKPHOS 157*  BILITOT 0.7  PROT 8.7*  ALBUMIN 3.4*   Recent Labs  Lab 08/10/18 1440  LIPASE 19     No results for input(s): AMMONIA in the last 168 hours. Coagulation Profile: No results for input(s): INR, PROTIME in the last 168 hours. Cardiac Enzymes: No results for input(s): CKTOTAL, CKMB, CKMBINDEX, TROPONINI in the last 168 hours. BNP (last 3 results) No results for input(s): PROBNP in the last 8760 hours. HbA1C: No results for input(s): HGBA1C in the  last 72 hours. CBG: No results for input(s): GLUCAP in the last 168 hours. Lipid Profile: No results for input(s): CHOL, HDL, LDLCALC, TRIG, CHOLHDL, LDLDIRECT in the last 72 hours. Thyroid Function Tests: No results for input(s): TSH, T4TOTAL, FREET4, T3FREE, THYROIDAB in the last 72 hours. Anemia Panel: No results for input(s): VITAMINB12, FOLATE, FERRITIN, TIBC, IRON, RETICCTPCT in the last 72 hours. Urine analysis:    Component Value Date/Time   COLORURINE YELLOW 07/20/2018 0045   APPEARANCEUR TURBID (Stephaney Steven) 07/20/2018 0045   LABSPEC 1.012 07/20/2018 0045   PHURINE 9.0 (H) 07/20/2018 0045   GLUCOSEU 50 (Juana Montini) 07/20/2018 0045   GLUCOSEU NEGATIVE 04/28/2017 1121   HGBUR NEGATIVE 07/20/2018 0045   BILIRUBINUR NEGATIVE 07/20/2018 0045   KETONESUR NEGATIVE 07/20/2018 0045   PROTEINUR >=300 (Jamiria Langill) 07/20/2018 0045   UROBILINOGEN 0.2 04/28/2017 1121   NITRITE NEGATIVE 07/20/2018 0045   LEUKOCYTESUR LARGE (Jaileigh Weimer) 07/20/2018 0045    Radiological Exams on Admission: Ct Abdomen Pelvis W Contrast  Result Date: 08/10/2018 CLINICAL DATA:  Abdominal pain since this morning. EXAM: CT ABDOMEN AND PELVIS WITH CONTRAST TECHNIQUE: Multidetector CT imaging of the abdomen and pelvis was performed using the standard protocol following bolus administration of intravenous contrast. CONTRAST:  64mL OMNIPAQUE IOHEXOL 300 MG/ML  SOLN COMPARISON:  12/16/2017 FINDINGS: Lower chest: Bibasilar atelectasis and scarring changes. No definite infiltrates or effusions. The heart is normal in size. No pericardial effusion. Pacer wires are noted. Hepatobiliary: No  focal hepatic lesions or intrahepatic biliary dilatation. The gallbladder demonstrates small gallstones but no findings for acute cholecystitis. No common bile duct dilatation. Pancreas: No mass, inflammation or ductal dilatation. Spleen: Normal size.  No focal lesions. Adrenals/Urinary Tract: The adrenal glands and kidneys are unremarkable. There is Patton Swisher lower pole left renal calculus noted but no obstructing ureteral calculi or bladder calculi. No obvious bladder lesions. Patient has Nikesh Teschner suprapubic urinary catheter in place. Stomach/Bowel: The stomach contains Lilibeth Opie feeding gastrostomy tube. No complicating features are identified. There is mild distention of the stomach with fluid. The duodenum is unremarkable. The proximal and mid small bowel loops are dilated and demonstrate air-fluid levels consistent with obstruction. There appears to be Samani Deal transition to nondilated/decompressed loops of small bowel in the upper pelvis in the midline (series 2, image 53 and series 5 image 49, 50 and 51). This is likely due to adhesions from prior surgery. Moderate stool in the colon and down into the rectum. No colonic mass or obstruction. The terminal ileum appears normal. Vascular/Lymphatic: Advanced atherosclerotic calcifications involving the aorta iliac arteries but no aneurysm. The branch vessels are patent. The major venous structures are patent. Small scattered mesenteric and retroperitoneal lymph nodes but no mass or overt adenopathy. Reproductive: The uterus and ovaries are unremarkable. Other: There is Leith Hedlund large left rectus sheath hematoma measuring approximately 17.5 x 13.0 x 7.5 cm. Musculoskeletal: No significant bony findings. Advanced osteoporosis for age. Manahil Vanzile sacral decubitus ulcer is noted and extends down very close to the bone. I do not see any obvious destructive bony changes or ever it appears that the lower sacrum and coccyx have been previously resected. IMPRESSION: 1. CT findings consistent with Ricci Dirocco fairly high-grade  small-bowel obstruction in the mid small bowel region in the central abdomen at the level of the iliac crest. This is most likely due to adhesions. 2. Very large left rectus sheath hematoma. 3. Feeding gastrostomy tube and suprapubic catheter is in good position without complicating features. 4. Sacral decubitus ulcer. 5. Marked age advanced vascular calcifications for  age. 1. Lower pole left renal calculus but no obstructing ureteral calculi. Electronically Signed   By: Marijo Sanes M.D.   On: 08/10/2018 16:28   Dg Chest Portable 1 View  Result Date: 08/10/2018 CLINICAL DATA:  Abdominal pain since 0300 hours this morning, nausea, single episode of vomiting, abdominal distension today, quadriplegia EXAM: PORTABLE CHEST 1 VIEW COMPARISON:  Portable exam 1403 hours compared to 07/20/2018 FINDINGS: LEFT subclavian pacemaker with leads projecting over RIGHT atrium and RIGHT ventricle. Tracheostomy tube projects over tracheal air column. Normal heart size, mediastinal contours, and pulmonary vascularity. RIGHT basilar scarring. Chronic accentuation of retrocardiac LEFT lower lobe markings unchanged since 06/22/2018 Remaining lungs clear. No pleural effusion or pneumothorax. No acute osseous findings. IMPRESSION: Chronic bibasilar changes without definite acute infiltrate. Electronically Signed   By: Lavonia Dana M.D.   On: 08/10/2018 14:52    EKG: Independently reviewed. pending  Assessment/Plan Active Problems:   SBO (small bowel obstruction) (HCC)   SBO: CT with high grade SBO in mid small bowel region in the central abdomen at the level of the iliac crest.  Likely 2/2 adhesions. Appreciate surgery recommendations NPO, hook g tube to foley to gravity IV Antiemetics (follow EKG for qtc), analgesia   Rectus Sheath Hematoma: likely related to lovenox injections for DVT ppx.  Large, 17.5 x 13 x 7.5 cm on imaging.  Will hold lovenox and continue to monitor.  Reimage as needed, consider discussion with  IR I'm not familiar with evidence regarding long term anticoagulation in pt with quadriplegia (brief uptodate search suggests most evidence around acute injury and for 8-12 weeks after) consider discussion with heme or neurosurgery to clarify recs? but at this point would recommend discontinuing for foreseeable future as risk outweighs benefit.  Acute Blood Loss Anemia:  Hb 7.1 down from 9.8 at last check.  Likely due to above.   Iron, b12, folate, ferritin.   Transfuse for <7  2 units ordered by EDP  Concern for UTI: no clear UTI sx, but some purulent discharge around catheter Slightly elevated WBC, afebrile Received aztreonam in ED, will ceftriaxone for now (received cephalosporins in past), follow culture  Quadriplegia with trach and peg as well as suprapubic catheter:  Baclofen 5 mg tid, gabapentin 300 mg tid  T2DM: SSI q4 while npo.  Hold home meds.  Depression   Insomnia: continue cymbalta, wellbutrin  Decubitus Ulcer: unable to evaluate at bedside as pt had just had bm.  Reevaluate tomorrow.  wound care c/s.  Chronic Hypotension: pt tells me midodrine was d/c'd, continue to monitor off midodrine  Hx COPD: continue home meds  Elevated istat hcg: repeat serum HCG was 4 (ref <5)  DVT prophylaxis: SCD Code Status: full  Family Communication: none at bedside, called mother  Disposition Plan: pending further improvement Consults called: surgery  Admission status: inpatient   Fayrene Helper MD [facility] Pager AMION  If 7PM-7AM, please contact night-coverage [email or web address] Password [facility]  08/10/2018, 6:54 PM

## 2018-08-10 NOTE — Progress Notes (Signed)
PT placed on 3 lpm 02 trach collar device with small H20 bottle (PT states she utilizes this type of device at facility). PT also states she utilized PMV (is in place at this time). Current Sp02 97%, HR 90, rr 17, BBS diminished- clear, no respiratory distress at this time. PT denies need for suctioning at this time. All emergency equipment based on [facility] sheet in room at this time (except drain sponge- PT has clean drain sponge on at this time).

## 2018-08-10 NOTE — ED Notes (Signed)
Repiratory called and made aware of pt's presence in dept. 66mm uncuffed shiley trach replacement at bedside.

## 2018-08-10 NOTE — ED Notes (Signed)
ED TO INPATIENT HANDOFF REPORT  ED Nurse Name and Phone #: Tillman Abide  S Name/Age/Gender Angela Page 48 y.o. female Room/Bed: WA23/WA23  Code Status   Code Status: Full Code  Home/SNF/Other Given to floor Patient oriented to: self, place, time and situation Is this baseline? Yes   Triage Complete: Triage complete  Chief Complaint Abdominal Pain  Triage Note Pt bib EMS and coming from [place] and presents with abd pain that began this morning around 3 or 4 am.  Pt had a hard BM yesterday and reports nausea and had one episode of emesis when EMS moved pt onto their stretcher for transport.  Pt reports her abdomen is more distended today.  Rates pain a 6 out 10.  Pt is a trach pt who is on 3L. Abd xray from facility was inconclusive and pt had a hemoglobin of 6.4 at the facility. Pt also has a chronic foley and is a quadriplegic.   Allergies Allergies  Allergen Reactions  . Penicillins Hives    Has patient had a PCN reaction causing immediate rash, facial/tongue/throat swelling, SOB or lightheadedness with hypotension: Yes Has patient had a PCN reaction causing severe rash involving mucus membranes or skin necrosis: Yes Has patient had a PCN reaction that required hospitalization No Has patient had a PCN reaction occurring within the last 10 years: No-childhood allergy If all of the above answers are "NO", then may proceed with Cephalosporin  Tolerated cephalosporins in past  . Erythromycin Nausea And Vomiting  . Nitrofurantoin Monohyd Macro Nausea And Vomiting  . Oxybutynin Other (See Comments)    Dries mouth out     Level of Care/Admitting Diagnosis ED Disposition    ED Disposition Condition [facility] Area: [place] [[id number]]  Level of Care: Telemetry [5]  Admit to tele based on following criteria: Complex arrhythmia (Bradycardia/Tachycardia)  Covid Evaluation: Asymptomatic Screening Protocol (No Symptoms)   Diagnosis: SBO (small bowel obstruction) [facility]) [[id number]]  Admitting Physician: Elodia Florence [phone]  Attending Physician: Cephus Slater, A CALDWELL [phone]  Estimated length of stay: past midnight tomorrow  Certification:: I certify this patient will need inpatient services for at least 2 midnights  PT Class (Do Not Modify): Inpatient [101]  PT Acc Code (Do Not Modify): Private [1]       B Medical/Surgery History Past Medical History:  Diagnosis Date  . Acute on chronic respiratory failure with hypoxia ([place])   . Anasarca 06/10/2016  . Asthma   . At high risk for severe sepsis   . Cocaine use 10/08/2013  . Decubitus ulcer of buttock, unstageable ([place])   . Depression   . Diabetes mellitus without complication ([place])   . GERD (gastroesophageal reflux disease)   . HCAP (healthcare-associated pneumonia) 06/09/2016  . Hepatitis    hx of hepatits frm mono   . Kidney stone   . Lobar pneumonia ([place])   . Overdose of opiate or related narcotic ([place]) 09/02/2014  . Pacemaker   . Pleurisy   . Polysubstance dependence including opioid type drug, episodic abuse ([place]) 10/27/2013  . Protein calorie malnutrition ([place]) 10/31/2015  . Quadriparesis ([place])   . Quadriplegia and quadriparesis ([place])   . Quadriplegia, C5-C7 incomplete ([place]) 10/08/2013  . Stage IV pressure ulcer of sacral region ([place]) 10/31/2015  . Tracheostomy status [facility])    Past Surgical History:  Procedure Laterality Date  . APPENDECTOMY    . BACK SURGERY  10/08/2013   fusion of spine,  cervical region  . CARDIAC SURGERY    . CYSTOSCOPY W/ URETERAL STENT PLACEMENT Right 03/09/2017   Procedure: CYSTOSCOPY [name] STENT PLACEMENT;  Surgeon: Irine Seal, MD;  Location[facility];  Service: Urology;  Laterality: Right;  . CYSTOSCOPY W/ URETERAL STENT REMOVAL Right 07/16/2017   Procedure: CYSTOSCOPY WITH STENT REMOVAL;  Surgeon: Franchot Gallo, MD;  Location: [facility];  Service: Urology;  Laterality: Right;  .  CYSTOSCOPY/URETEROSCOPY/HOLMIUM LASER/STENT PLACEMENT Right 07/16/2017   Procedure: CYSTOSCOPY/URETEROSCOPY/HOLMIUM LASER/STENT PLACEMENT/ALSO STONE EXTRACTION;  Surgeon: Franchot Gallo, MD;  Location[facility];  Service: Urology;  Laterality: Right;  . GASTROSTOMY  11/23/2015  . I&D EXTREMITY  10/28/2011   Procedure: IRRIGATION AND DEBRIDEMENT EXTREMITY;  Surgeon: Tennis Must, MD;  Location: [place];  Service: Orthopedics;  Laterality: Right;  Irrigation and debridement right middle finger  . INSERTION OF SUPRAPUBIC CATHETER N/A 07/28/2016   Procedure: INSERTION OF SUPRAPUBIC CATHETER;  Surgeon: Franchot Gallo, MD;  [facility];  Service: Urology;  Laterality: N/A;  . IR GENERIC HISTORICAL  11/23/2015   IR GASTROSTOMY TUBE MOD SED 11/23/2015 [facility]-INTER[facility]  . IR PATIENT EVAL TECH 0-60 MINS  12/18/2017  . IR REPLACE G-TUBE SIMPLE WO FLUORO  12/19/2017  . LITHOTRIPSY  08/2017  . TRACHEOSTOMY       A IV Location/Drains/Wounds Patient Lines/Drains/Airways Status   Active Line/Drains/Airways    Name:   Placement date:   Placement time:   Site:   Days:   Peripheral IV 08/10/18 Right Arm   08/10/18    1449    Arm   less than 1   Peripheral IV 08/10/18 Left Hand   08/10/18    1531    Hand   less than 1   Suprapubic Catheter   07/20/18    -    -   21   Tracheostomy [place] 6 mm Uncuffed   08/10/18    1922    6 mm   less than 1   Pressure Injury 07/20/18 Sacrum   07/20/18    0146     21   Pressure Injury Ischial tuberosity Left   -    -        Pressure Injury Ischial tuberosity Right   -    -               Intake/Output Last 24 hours  Intake/Output Summary (Last 24 hours) at 08/10/2018 2256 Last data filed at 08/10/2018 2011 Gross per 24 hour  Intake 1389 ml  Output -  Net 1389 ml    Labs/Imaging Results for orders placed or performed during the hospital encounter of 08/10/18 (from the past 48 hour(s))  CBC with Differential     Status: Abnormal   Collection  Time: 08/10/18  2:40 PM  Result Value Ref Range   WBC 13.3 (H) 4.0 - 10.5 K/uL   RBC 2.59 (L) 3.87 - 5.11 MIL/uL   Hemoglobin 7.1 (L) 12.0 - 15.0 g/dL   HCT 23.7 (L) 36.0 - 46.0 %   MCV 91.5 80.0 - 100.0 fL   MCH 27.4 26.0 - 34.0 pg   MCHC 30.0 30.0 - 36.0 g/dL   RDW 16.1 (H) 11.5 - 15.5 %   Platelets 470 (H) 150 - 400 K/uL   nRBC 0.2 0.0 - 0.2 %   Neutrophils Relative % 86 %   Neutro Abs 11.4 (H) 1.7 - 7.7 K/uL   Lymphocytes Relative 9 %   Lymphs Abs 1.2 0.7 -  4.0 K/uL   Monocytes Relative 5 %   Monocytes Absolute 0.7 0.1 - 1.0 K/uL   Eosinophils Relative 0 %   Eosinophils Absolute 0.0 0.0 - 0.5 K/uL   Basophils Relative 0 %   Basophils Absolute 0.0 0.0 - 0.1 K/uL   Immature Granulocytes 0 %   Abs Immature Granulocytes 0.05 0.00 - 0.07 K/uL    Comment: Performed at [facility], [place] [address], [place], [place] [address]  Comprehensive metabolic panel     Status: Abnormal   Collection Time: 08/10/18  2:40 PM  Result Value Ref Range   Sodium 135 135 - 145 mmol/L   Potassium 4.2 3.5 - 5.1 mmol/L   Chloride 92 (L) 98 - 111 mmol/L   CO2 28 22 - 32 mmol/L   Glucose, Bld 178 (H) 70 - 99 mg/dL   BUN 17 6 - 20 mg/dL   Creatinine, Ser 0.54 0.44 - 1.00 mg/dL   Calcium 9.7 8.9 - 10.3 mg/dL   Total Protein 8.7 (H) 6.5 - 8.1 g/dL   Albumin 3.4 (L) 3.5 - 5.0 g/dL   AST 18 15 - 41 U/L   ALT 16 0 - 44 U/L   Alkaline Phosphatase 157 (H) 38 - 126 U/L   Total Bilirubin 0.7 0.3 - 1.2 mg/dL   GFR calc non Af Amer >60 >60 mL/min   GFR calc Af Amer >60 >60 mL/min   Anion gap 15 5 - 15    Comment: Performed at [facility], [place] [address] [place], [place] [address]  Lipase, blood     Status: None   Collection Time: 08/10/18  2:40 PM  Result Value Ref Range   Lipase 19 11 - 51 U/L    Comment: Performed at [facility], [place] [address], [place],  [address]  hCG, quantitative, pregnancy     Status: None   Collection Time:  08/10/18  2:40 PM  Result Value Ref Range   hCG, Beta Chain, Quant, S 4 <5 mIU/mL    Comment:          GEST. AGE      CONC.  (mIU/mL)   <=1 WEEK        5 - 50     2 WEEKS       50 - 500     3 WEEKS       100 - 10,000     4 WEEKS     1,000 - 30,000     5 WEEKS     3,500 - 115,000   6-8 WEEKS     12,000 - 270,000    12 WEEKS     15,000 - 220,000        FEMALE AND NON-PREGNANT FEMALE:     LESS THAN 5 mIU/mL Performed at [facility], [place] [address] [place], [place] [address]   Lactic acid, plasma     Status: None   Collection Time: 08/10/18  2:48 PM  Result Value Ref Range   Lactic Acid, Venous 1.1 0.5 - 1.9 mmol/L    Comment: Performed at [facility], [place] [address], [place], [place] [address]  Iron and TIBC     Status: Abnormal   Collection Time: 08/10/18  3:00 PM  Result Value Ref Range   Iron 29 28 - 170 ug/dL   TIBC 313 250 - 450 ug/dL   Saturation Ratios 9 (L) 10.4 - 31.8 %  UIBC 284 ug/dL    Comment: Performed at [facility], [place] [address], [place], [place] [address]  Ferritin     Status: None   Collection Time: 08/10/18  3:00 PM  Result Value Ref Range   Ferritin 84 11 - 307 ng/mL    Comment: Performed at [facility], [place] [address], [place], [place] [address]  Vitamin B12     Status: None   Collection Time: 08/10/18  3:00 PM  Result Value Ref Range   Vitamin B-12 493 180 - 914 pg/mL    Comment: (NOTE) This assay is not validated for testing neonatal or myeloproliferative syndrome specimens for Vitamin B12 levels. Performed at [facility], [place] [address], [place], [place] [address]   Folate     Status: None   Collection Time: 08/10/18  3:00 PM  Result Value Ref Range   Folate 24.0 >5.9 ng/mL    Comment: Performed at [facility], [place] [address], [place], [place] [address]  I-Stat Beta hCG blood, ED ([facility], [facility], [facility] only)     Status: Abnormal    Collection Time: 08/10/18  3:25 PM  Result Value Ref Range   I-stat hCG, quantitative 29.2 (H) <5 mIU/mL   Comment 3            Comment:   GEST. AGE      CONC.  (mIU/mL)   <=1 WEEK        5 - 50     2 WEEKS       50 - 500     3 WEEKS       100 - 10,000     4 WEEKS     1,000 - 30,000        FEMALE AND NON-PREGNANT FEMALE:     LESS THAN 5 mIU/mL   Type and screen [place]     Status: None (Preliminary result)   Collection Time: 08/10/18  3:38 PM  Result Value Ref Range   ABO/RH(D) A POS    Antibody Screen NEG    Sample Expiration 08/13/2018,2359    Unit Number [id number]    Blood Component Type RBC LR PHER1    Unit division 00    Status of Unit ALLOCATED    Transfusion Status OK TO TRANSFUSE    Crossmatch Result Compatible    Unit Number [id number]    Blood Component Type RBC LR PHER2    Unit division 00    Status of Unit ISSUED    Transfusion Status OK TO TRANSFUSE    Crossmatch Result      Compatible Performed at [facility], [place] [address], [place], [place] [address]   Prepare RBC     Status: None   Collection Time: 08/10/18  4:39 PM  Result Value Ref Range   Order Confirmation      ORDER PROCESSED BY BLOOD BANK Performed at [place] [address], [place],  [address]   SARS Coronavirus 2 (CEPHEID - Performed in [place] hospital lab), Hosp Order     Status: None   Collection Time: 08/10/18  6:20 PM   Specimen: Nasopharyngeal Swab  Result Value Ref Range   SARS Coronavirus 2 NEGATIVE NEGATIVE    Comment: (NOTE) If result is NEGATIVE SARS-CoV-2 target nucleic acids are NOT DETECTED. The SARS-CoV-2 RNA is generally detectable in upper and lower  respiratory specimens during the acute phase of infection. The  lowest  concentration of SARS-CoV-2 viral copies this assay can detect is 250  copies / mL. A negative result does not preclude SARS-CoV-2 infection  and should not be used as the  sole basis for treatment or other  patient management decisions.  A negative result may occur with  improper specimen collection / handling, submission of specimen other  than nasopharyngeal swab, presence of viral mutation(s) within the  areas targeted by this assay, and inadequate number of viral copies  (<250 copies / mL). A negative result must be combined with clinical  observations, patient history, and epidemiological information. If result is POSITIVE SARS-CoV-2 target nucleic acids are DETECTED. The SARS-CoV-2 RNA is generally detectable in upper and lower  respiratory specimens dur ing the acute phase of infection.  Positive  results are indicative of active infection with SARS-CoV-2.  Clinical  correlation with patient history and other diagnostic information is  necessary to determine patient infection status.  Positive results do  not rule out bacterial infection or co-infection with other viruses. If result is PRESUMPTIVE POSTIVE SARS-CoV-2 nucleic acids MAY BE PRESENT.   A presumptive positive result was obtained on the submitted specimen  and confirmed on repeat testing.  While 2019 novel coronavirus  (SARS-CoV-2) nucleic acids may be present in the submitted sample  additional confirmatory testing may be necessary for epidemiological  and / or clinical management purposes  to differentiate between  SARS-CoV-2 and other Sarbecovirus currently known to infect humans.  If clinically indicated additional testing with an alternate test  methodology [phone]) is advised. The SARS-CoV-2 RNA is generally  detectable in upper and lower respiratory sp ecimens during the acute  phase of infection. The expected result is Negative. Fact Sheet for Patients:  StrictlyIdeas.no Fact Sheet for Healthcare Providers: BankingDealers.co.za This test is not yet approved or cleared by the [place] FDA and has been authorized for detection  and/or diagnosis of SARS-CoV-2 by FDA under an Emergency Use Authorization (EUA).  This EUA will remain in effect (meaning this test can be used) for the duration of the COVID-19 declaration under Section 564(b)(1) of the Act, 21 U.S.C. section 360bbb-3(b)(1), unless the authorization is terminated or revoked sooner. Performed at [facility], [place] [address] [place],  [address]   CBG monitoring, ED     Status: Abnormal   Collection Time: 08/10/18 10:06 PM  Result Value Ref Range   Glucose-Capillary 115 (H) 70 - 99 mg/dL   Ct Abdomen Pelvis W Contrast  Result Date: 08/10/2018 CLINICAL DATA:  Abdominal pain since this morning. EXAM: CT ABDOMEN AND PELVIS WITH CONTRAST TECHNIQUE: Multidetector CT imaging of the abdomen and pelvis was performed using the standard protocol following bolus administration of intravenous contrast. CONTRAST:  74mL OMNIPAQUE IOHEXOL 300 MG/ML  SOLN COMPARISON:  12/16/2017 FINDINGS: Lower chest: Bibasilar atelectasis and scarring changes. No definite infiltrates or effusions. The heart is normal in size. No pericardial effusion. Pacer wires are noted. Hepatobiliary: No focal hepatic lesions or intrahepatic biliary dilatation. The gallbladder demonstrates small gallstones but no findings for acute cholecystitis. No common bile duct dilatation. Pancreas: No mass, inflammation or ductal dilatation. Spleen: Normal size.  No focal lesions. Adrenals/Urinary Tract: The adrenal glands and kidneys are unremarkable. There is a lower pole left renal calculus noted but no obstructing ureteral calculi or bladder calculi. No obvious bladder lesions. Patient has a suprapubic urinary catheter in place. Stomach/Bowel: The stomach contains a feeding gastrostomy tube. No complicating features are identified. There is mild distention  of the stomach with fluid. The duodenum is unremarkable. The proximal and mid small bowel loops are dilated and demonstrate air-fluid  levels consistent with obstruction. There appears to be a transition to nondilated/decompressed loops of small bowel in the upper pelvis in the midline (series 2, image 53 and series 5 image 49, 50 and 51). This is likely due to adhesions from prior surgery. Moderate stool in the colon and down into the rectum. No colonic mass or obstruction. The terminal ileum appears normal. Vascular/Lymphatic: Advanced atherosclerotic calcifications involving the aorta iliac arteries but no aneurysm. The branch vessels are patent. The major venous structures are patent. Small scattered mesenteric and retroperitoneal lymph nodes but no mass or overt adenopathy. Reproductive: The uterus and ovaries are unremarkable. Other: There is a large left rectus sheath hematoma measuring approximately 17.5 x 13.0 x 7.5 cm. Musculoskeletal: No significant bony findings. Advanced osteoporosis for age. A sacral decubitus ulcer is noted and extends down very close to the bone. I do not see any obvious destructive bony changes or ever it appears that the lower sacrum and coccyx have been previously resected. IMPRESSION: 1. CT findings consistent with a fairly high-grade small-bowel obstruction in the mid small bowel region in the central abdomen at the level of the iliac crest. This is most likely due to adhesions. 2. Very large left rectus sheath hematoma. 3. Feeding gastrostomy tube and suprapubic catheter is in good position without complicating features. 4. Sacral decubitus ulcer. 5. Marked age advanced vascular calcifications for age. 6. Lower pole left renal calculus but no obstructing ureteral calculi. Electronically Signed   By: Marijo Sanes M.D.   On: 08/10/2018 16:28   Dg Chest Portable 1 View  Result Date: 08/10/2018 CLINICAL DATA:  Abdominal pain since 0300 hours this morning, nausea, single episode of vomiting, abdominal distension today, quadriplegia EXAM: PORTABLE CHEST 1 VIEW COMPARISON:  Portable exam 1403 hours compared to  07/20/2018 FINDINGS: LEFT subclavian pacemaker with leads projecting over RIGHT atrium and RIGHT ventricle. Tracheostomy tube projects over tracheal air column. Normal heart size, mediastinal contours, and pulmonary vascularity. RIGHT basilar scarring. Chronic accentuation of retrocardiac LEFT lower lobe markings unchanged since 06/22/2018 Remaining lungs clear. No pleural effusion or pneumothorax. No acute osseous findings. IMPRESSION: Chronic bibasilar changes without definite acute infiltrate. Electronically Signed   By: Lavonia Dana M.D.   On: 08/10/2018 14:52    Pending Labs Unresulted Labs (From admission, onward)    Start     Ordered   08/11/18 0500  Comprehensive metabolic panel  Tomorrow morning,   R     08/10/18 2023   08/11/18 0500  CBC  Tomorrow morning,   R     08/10/18 2023   08/10/18 1406  Urine culture  ONCE - STAT,   STAT     08/10/18 1406   08/10/18 1406  Culture, blood (routine x 2)  BLOOD CULTURE X 2,   STAT     08/10/18 1406   08/10/18 1406  Lactic acid, plasma  Now then every 2 hours,   STAT     08/10/18 1406   08/10/18 1405  Urinalysis, Routine w reflex microscopic  (ED Abdominal Pain)  ONCE - STAT,   STAT     08/10/18 1406          Vitals/Pain Today's Vitals   08/10/18 2130 08/10/18 2200 08/10/18 2230 08/10/18 2233  BP: 121/75 118/80 106/81   Pulse: 90 88 88   Resp: 13 13 17    Temp:  TempSrc:      SpO2: 99% 99% 100%   PainSc:    3     Isolation Precautions No active isolations  Medications Medications  sodium chloride (PF) 0.9 % injection (has no administration in time range)  0.9 %  sodium chloride infusion (Manually program via Guardrails IV Fluids) (has no administration in time range)  tiotropium (SPIRIVA) inhalation capsule ([facility] use ONLY) 18 mcg (has no administration in time range)  ondansetron (ZOFRAN) injection 4 mg (has no administration in time range)  morphine 2 MG/ML injection 1 mg (1 mg Intravenous Given 08/10/18 2009)  insulin  aspart (novoLOG) injection 0-9 Units (0 Units Subcutaneous Not Given 08/10/18 2212)  cefTRIAXone (ROCEPHIN) 1 g in sodium chloride 0.9 % 100 mL IVPB (has no administration in time range)  gabapentin (NEURONTIN) capsule 300 mg (has no administration in time range)  DULoxetine (CYMBALTA) DR capsule 30 mg (has no administration in time range)  zolpidem (AMBIEN) tablet 2.5 mg (has no administration in time range)  buPROPion (WELLBUTRIN SR) 12 hr tablet 150 mg (has no administration in time range)  baclofen (LIORESAL) tablet 5 mg (has no administration in time range)  lactated ringers infusion ( Intravenous New Bag/Given 08/10/18 2231)  diatrizoate meglumine-sodium (GASTROGRAFIN) 66-10 % solution 90 mL (has no administration in time range)  LORazepam (ATIVAN) injection 1 mg (has no administration in time range)  sodium chloride 0.9 % bolus 1,000 mL (0 mLs Intravenous Stopped 08/10/18 1632)  iohexol (OMNIPAQUE) 300 MG/ML solution 100 mL (80 mLs Intravenous Contrast Given 08/10/18 1554)  morphine 4 MG/ML injection 4 mg (4 mg Intravenous Given 08/10/18 1629)  ondansetron (ZOFRAN) injection 4 mg (4 mg Intravenous Given 08/10/18 1630)  aztreonam (AZACTAM) 2 g in sodium chloride 0.9 % 100 mL IVPB (0 g Intravenous Stopped 08/10/18 2011)    Mobility non-ambulatory Low fall risk   Focused Assessments GI/ABD   R Recommendations: See Admitting Provider Note  Report given to:   Additional Notes:

## 2018-08-10 NOTE — ED Provider Notes (Signed)
[facility] DEPT Provider Note   CSN: [id number] Arrival date & time: 08/10/18  1339    History   Chief Complaint No chief complaint on file.   HPI Angela Page is a 48 y.o. female.     Pt presents to the ED today with abdominal pain.  Pt has been a quadriplegic since she fell off a friend's deck in September of 2015 and sustained an epidural hematoma from C3-C6 with cord compression.  She has frequent bouts of sepsis and was hospitalized most recently for sepsis from a UTI from 6/30-7/3.  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  The pt was getting tube feedings through her G-tube early this morning and developed severe abdominal pain.  She felt nauseous, but did not throw up until a few minutes ago.  The pt said her WBC has been high at the SNF and abx for pna were started today.  She said she does not feel like she has pneumonia.  She has not had a fever.  She said she's been having bowel movements.  No known covid exposures.     Past Medical History:  Diagnosis Date  . Acute on chronic respiratory failure with hypoxia ([place])   . Anasarca 06/10/2016  . Asthma   . At high risk for severe sepsis   . Cocaine use 10/08/2013  . Decubitus ulcer of buttock, unstageable ([place])   . Depression   . Diabetes mellitus without complication ([place])   . GERD (gastroesophageal reflux disease)   . HCAP (healthcare-associated pneumonia) 06/09/2016  . Hepatitis    hx of hepatits frm mono   . Kidney stone   . Lobar pneumonia ([place])   . Overdose of opiate or related narcotic ([place]) 09/02/2014  . Pacemaker   . Pleurisy   . Polysubstance dependence including opioid type drug, episodic abuse ([place]) 10/27/2013  . Protein calorie malnutrition ([place]) 10/31/2015  . Quadriparesis ([place])   . Quadriplegia and quadriparesis ([place])   . Quadriplegia, C5-C7 incomplete ([place]) 10/08/2013  . Stage IV pressure ulcer of sacral region ([place]) 10/31/2015  .  Tracheostomy status [facility])     Patient Active Problem List   Diagnosis Date Noted  . SBO (small bowel obstruction) ([place]) 08/10/2018  . Acute encephalopathy 07/20/2018  . Hypercalcemia 06/23/2018  . Thrombocytosis ([place]) 06/22/2018  . Chronic respiratory failure with hypoxia ([place])   . Acute on chronic respiratory failure ([place])   . Quadriplegia and quadriparesis ([place])   . At high risk for severe sepsis   . Decubitus ulcer of buttock, unstageable ([place])   . Lactic acid acidosis 12/16/2017  . Suprapubic catheter ([place]) 12/16/2017  . Pressure injury of skin 07/17/2017  . Kidney stone 07/16/2017  . Right ureteral stone 03/10/2017  . Complicated UTI (urinary tract infection) 03/09/2017  . Ineffective airway clearance   . Sepsis secondary to UTI ([place]) 06/30/2016  . Autonomic neuropathy 06/10/2016  . Presence of permanent cardiac pacemaker 06/10/2016  . 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 Chronically on opiate therapy 06/10/2016  . PEG (percutaneous endoscopic gastrostomy) status ([place]) 06/10/2016  . Pulmonary hypertension ([place]) 06/10/2016  . Hypoalbuminemia 06/10/2016  . Peripheral edema   . Tracheostomy dependence ([place])   . Chronic obstructive pulmonary disease ([place])   . Chronic diastolic CHF (congestive heart failure) ([place]) 01/23/2016  . Tracheostomy status ([place])   . Esophageal dysphagia   . Malnutrition of moderate degree 11/01/2015  . Tobacco abuse 10/31/2015  . Asthma 10/31/2015  .  History of pneumonia 10/08/2015  . Pressure ulcer of contiguous region involving buttock and hip, stage 4 ([place]) 02/26/2015  . Normocytic normochromic anemia 02/25/2015  . Altered mental status 10/29/2014  . Acute metabolic encephalopathy 16/10/9602  . Leukocytosis 10/23/2014  . Hypokalemia 10/23/2014  . Decubitus ulcer of sacral region, stage 4 ([place]) 10/23/2014  . Anemia, iron deficiency 10/23/2014  . Quadriplegia, C5-C7 incomplete ([place]) 10/22/2014  . Depression   . Protein-calorie  malnutrition, severe ([place]) 09/05/2014  . 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 Recurrent UTI 03/31/2014  . Pressure ulcer of coccygeal region, stage 4 ([place]) 01/06/2014  . Stage 4 skin ulcer of sacral region ([place]) 01/06/2014  . Neuropathic pain 11/30/2013  . 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 PEG (percutaneous endoscopic gastrostomy) adjustment/replacement/removal ([place]) 11/30/2013  . Spinal cord injury, cervical region ([place]) 11/30/2013  . Chronic pain due to injury 11/30/2013  . S/P cervical spinal fusion 10/27/2013  . Tracheostomy care ([place]) 10/27/2013  . 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 SCI (spinal cord injury) 10/11/2013    Past Surgical History:  Procedure Laterality Date  . APPENDECTOMY    . BACK SURGERY  10/08/2013   fusion of spine, cervical region  . CARDIAC SURGERY    . CYSTOSCOPY W/ URETERAL STENT PLACEMENT Right 03/09/2017   Procedure: CYSTOSCOPY [name] STENT PLACEMENT;  Surgeon: Irine Seal, MD;  Location: [facility];  Service: Urology;  Laterality: Right;  . CYSTOSCOPY W/ URETERAL STENT REMOVAL Right 07/16/2017   Procedure: CYSTOSCOPY WITH STENT REMOVAL;  Surgeon: Franchot Gallo, MD;  Location: [facility];  Service: Urology;  Laterality: Right;  . CYSTOSCOPY/URETEROSCOPY/HOLMIUM LASER/STENT PLACEMENT Right 07/16/2017   Procedure: CYSTOSCOPY/URETEROSCOPY/HOLMIUM LASER/STENT PLACEMENT/ALSO STONE EXTRACTION;  Surgeon: Franchot Gallo, MD;  Location: [facility];  Service: Urology;  Laterality: Right;  . GASTROSTOMY  11/23/2015  . I&D EXTREMITY  10/28/2011   Procedure: IRRIGATION AND DEBRIDEMENT EXTREMITY;  Surgeon: Tennis Must, MD;  Location: [facility];  Service: Orthopedics;  Laterality: Right;  Irrigation and debridement right middle finger  . INSERTION OF SUPRAPUBIC CATHETER N/A 07/28/2016   Procedure: INSERTION OF SUPRAPUBIC CATHETER;  Surgeon: Franchot Gallo, MD;  Location: [facility];  Service: Urology;   Laterality: N/A;  . IR GENERIC HISTORICAL  11/23/2015   IR GASTROSTOMY TUBE MOD SED 11/23/2015 [facility]-INTER[facility] RAD  . IR PATIENT EVAL TECH 0-60 MINS  12/18/2017  . IR REPLACE G-TUBE SIMPLE WO FLUORO  12/19/2017  . LITHOTRIPSY  08/2017  . TRACHEOSTOMY       OB History   No obstetric history on file.      Home Medications    Prior to Admission medications   Medication Sig Start Date End Date Taking? Authorizing Provider  buPROPion (WELLBUTRIN SR) 150 MG 12 hr tablet Take 150 mg by mouth daily.    [provider]  Dulaglutide (TRULICITY) 1.5 VW/0.9WJ SOPN Inject 1.5 mg into the skin every Friday.    [provider]  DULoxetine (CYMBALTA) 30 MG capsule Take 1 capsule (30 mg total) by mouth 2 (two) times daily. 02/17/18   Danford, Suann Larry, MD  enoxaparin (LOVENOX) 40 MG/0.4ML injection Inject 40 mg into the skin daily.    [provider]  famotidine (PEPCID) 40 MG/5ML suspension Place 2.5 mLs (20 mg total) into feeding tube at bedtime. 12/31/17   Arrien, Jimmy Picket, MD  ferrous sulfate 300 (60 Fe) MG/5ML syrup Place 3.7 mLs (220 mg total) into feeding tube daily with breakfast. 04/11/18   Irene Pap  N, DO  gabapentin (NEURONTIN) 300 MG capsule Take 300 mg by mouth 3 (three) times daily.    [provider]  insulin aspart (NOVOLOG) 100 UNIT/ML injection Inject 10 Units into the skin every 4 (four) hours. For glucose 150 to 200 use 1 units, for glucose 201-250 use 2 units, for 251 to 300 use 4 units, for 301 to 350 use 6 units, for 351 to 400 use 8 units, for 401 or greater use 10 units. Patient taking differently: Inject 0-10 Units into the skin See admin instructions. Check FSBS every 6 hours. Give insulin according to sliding scale:  0-150 0 units 151-200 1 units 201-250 2 units 251-300 4 units 301-350 6 units 351-400 8 units >400 call MD 12/31/17   Arrien, Jimmy Picket, MD  Insulin Glargine (BASAGLAR KWIKPEN) 100 UNIT/ML SOPN Inject 0.05 mLs  (5 Units total) into the skin at bedtime. 04/11/18   Kayleen Memos, DO  lip balm (CARMEX) ointment Apply topically as needed for lip care. 06/27/18   Georgette Shell, MD  loratadine (CLARITIN) 10 MG tablet Take 10 mg by mouth daily. For seven days for rash Started on 6.28.20    [provider]  metoCLOPramide (REGLAN) 5 MG tablet Place 5 mg into feeding tube 4 (four) times daily.     [provider]  midodrine (PROAMATINE) 10 MG tablet Take 10 mg by mouth 3 (three) times daily as needed (hypotension).     [provider]  Nutritional Supplements (FEEDING SUPPLEMENT, GLUCERNA 1.2 CAL,) LIQD Place 1,000 mLs into feeding tube daily. Patient taking differently: Place 1,000 mLs into feeding tube See admin instructions. On at 1400 and off at 1000 96mL/hr 02/18/18   Danford, Suann Larry, MD  Nutritional Supplements (PROMOD) LIQD Take 30 mLs by mouth 3 (three) times daily.    [provider]  OXYGEN Inhale 3 L into the lungs continuous.    [provider]  polyethylene glycol (MIRALAX / GLYCOLAX) packet Take 17 g by mouth daily. Hold if diarrhea. 01/01/18   Arrien, Jimmy Picket, MD  tiotropium (SPIRIVA) 18 MCG inhalation capsule Place 18 mcg into inhaler and inhale daily.    [provider]  zolpidem (AMBIEN) 5 MG tablet Take 2.5 mg by mouth at bedtime.    [provider]    Family History Family History  Problem Relation Age of Onset  . Hypertension Maternal Uncle   . Kidney failure Maternal Uncle   . Colon cancer Mother   . Lung cancer Father   . Hypertension Brother   . Kidney failure Maternal Aunt   . Kidney failure Maternal Uncle   . Colon cancer Maternal Grandfather   . Esophageal cancer Neg Hx     Social History Social History   Tobacco Use  . Smoking status: Former Smoker    Packs/day: 1.00    Years: 25.00    Pack years: 25.00    Types: Cigarettes  . Smokeless tobacco: Never Used  Substance Use Topics  .  Alcohol use: Never    Frequency: Never  . Drug use: No     Allergies   Penicillins, Erythromycin, Nitrofurantoin monohyd macro, and Oxybutynin   Review of Systems Review of Systems  Gastrointestinal: Positive for abdominal pain, nausea and vomiting.  All other systems reviewed and are negative.    Physical Exam Updated Vital Signs BP 110/72   Pulse 93   Temp 99.2 F (37.3 C) (Oral)   Resp 15   LMP 05/27/2016 (Within  Days) Comment: Irregular periods since October 2015.  SpO2 100%   Physical Exam Vitals signs and nursing note reviewed.  HENT:     Head: Normocephalic and atraumatic.     Right Ear: External ear normal.     Left Ear: External ear normal.     Nose: Nose normal.     Mouth/Throat:     Mouth: Mucous membranes are dry.     Pharynx: Oropharynx is clear.  Eyes:     Extraocular Movements: Extraocular movements intact.     Conjunctiva/sclera: Conjunctivae normal.     Pupils: Pupils are equal, round, and reactive to light.  Neck:     Comments: Trach in place with trach collar Cardiovascular:     Rate and Rhythm: Normal rate and regular rhythm.     Pulses: Normal pulses.     Heart sounds: Normal heart sounds.  Pulmonary:     Effort: Pulmonary effort is normal.     Breath sounds: Normal breath sounds.  Abdominal:     General: Bowel sounds are decreased. There is distension.     Tenderness: There is generalized abdominal tenderness.     Comments: g-tube site looks good  Musculoskeletal:     Comments: Contractures of legs and arms (chronic)  Skin:    General: Skin is warm.     Capillary Refill: Capillary refill takes less than 2 seconds.  Neurological:     Mental Status: She is alert and oriented to person, place, and time.     Comments: Quadriplegic.  Minimal movement of bilateral UE.  Psychiatric:        Mood and Affect: Mood normal.      ED Treatments / Results  Labs (all labs ordered are listed, but only abnormal results are displayed) Labs  Reviewed  CBC WITH DIFFERENTIAL/PLATELET - Abnormal; Notable for the following components:      Result Value   WBC 13.3 (*)    RBC 2.59 (*)    Hemoglobin 7.1 (*)    HCT 23.7 (*)    RDW 16.1 (*)    Platelets 470 (*)    Neutro Abs 11.4 (*)    All other components within normal limits  COMPREHENSIVE METABOLIC PANEL - Abnormal; Notable for the following components:   Chloride 92 (*)    Glucose, Bld 178 (*)    Total Protein 8.7 (*)    Albumin 3.4 (*)    Alkaline Phosphatase 157 (*)    All other components within normal limits  I-STAT BETA HCG BLOOD, ED ([facility], [facility], [facility] ONLY) - Abnormal; Notable for the following components:   I-stat hCG, quantitative 29.2 (*)    All other components within normal limits  URINE CULTURE  CULTURE, BLOOD (ROUTINE X 2)  CULTURE, BLOOD (ROUTINE X 2)  SARS CORONAVIRUS 2 (HOSPITAL ORDER, [facility])  LIPASE, BLOOD  LACTIC ACID, PLASMA  HCG, QUANTITATIVE, PREGNANCY  URINALYSIS, ROUTINE W REFLEX MICROSCOPIC  LACTIC ACID, PLASMA  TYPE AND SCREEN  PREPARE RBC (CROSSMATCH)    EKG None  Radiology Ct Abdomen Pelvis W Contrast  Result Date: 08/10/2018 CLINICAL DATA:  Abdominal pain since this morning. EXAM: CT ABDOMEN AND PELVIS WITH CONTRAST TECHNIQUE: Multidetector CT imaging of the abdomen and pelvis was performed using the standard protocol following bolus administration of intravenous contrast. CONTRAST:  54mL OMNIPAQUE IOHEXOL 300 MG/ML  SOLN COMPARISON:  12/16/2017 FINDINGS: Lower chest: Bibasilar atelectasis and scarring changes. No definite infiltrates or effusions. The heart is normal in size.  No pericardial effusion. Pacer wires are noted. Hepatobiliary: No focal hepatic lesions or intrahepatic biliary dilatation. The gallbladder demonstrates small gallstones but no findings for acute cholecystitis. No common bile duct dilatation. Pancreas: No mass, inflammation or ductal dilatation. Spleen: Normal size.  No focal lesions.  Adrenals/Urinary Tract: The adrenal glands and kidneys are unremarkable. There is a lower pole left renal calculus noted but no obstructing ureteral calculi or bladder calculi. No obvious bladder lesions. Patient has a suprapubic urinary catheter in place. Stomach/Bowel: The stomach contains a feeding gastrostomy tube. No complicating features are identified. There is mild distention of the stomach with fluid. The duodenum is unremarkable. The proximal and mid small bowel loops are dilated and demonstrate air-fluid levels consistent with obstruction. There appears to be a transition to nondilated/decompressed loops of small bowel in the upper pelvis in the midline (series 2, image 53 and series 5 image 49, 50 and 51). This is likely due to adhesions from prior surgery. Moderate stool in the colon and down into the rectum. No colonic mass or obstruction. The terminal ileum appears normal. Vascular/Lymphatic: Advanced atherosclerotic calcifications involving the aorta iliac arteries but no aneurysm. The branch vessels are patent. The major venous structures are patent. Small scattered mesenteric and retroperitoneal lymph nodes but no mass or overt adenopathy. Reproductive: The uterus and ovaries are unremarkable. Other: There is a large left rectus sheath hematoma measuring approximately 17.5 x 13.0 x 7.5 cm. Musculoskeletal: No significant bony findings. Advanced osteoporosis for age. A sacral decubitus ulcer is noted and extends down very close to the bone. I do not see any obvious destructive bony changes or ever it appears that the lower sacrum and coccyx have been previously resected. IMPRESSION: 1. CT findings consistent with a fairly high-grade small-bowel obstruction in the mid small bowel region in the central abdomen at the level of the iliac crest. This is most likely due to adhesions. 2. Very large left rectus sheath hematoma. 3. Feeding gastrostomy tube and suprapubic catheter is in good position without  complicating features. 4. Sacral decubitus ulcer. 5. Marked age advanced vascular calcifications for age. 6. Lower pole left renal calculus but no obstructing ureteral calculi. Electronically Signed   By: Marijo Sanes M.D.   On: 08/10/2018 16:28   Dg Chest Portable 1 View  Result Date: 08/10/2018 CLINICAL DATA:  Abdominal pain since 0300 hours this morning, nausea, single episode of vomiting, abdominal distension today, quadriplegia EXAM: PORTABLE CHEST 1 VIEW COMPARISON:  Portable exam 1403 hours compared to 07/20/2018 FINDINGS: LEFT subclavian pacemaker with leads projecting over RIGHT atrium and RIGHT ventricle. Tracheostomy tube projects over tracheal air column. Normal heart size, mediastinal contours, and pulmonary vascularity. RIGHT basilar scarring. Chronic accentuation of retrocardiac LEFT lower lobe markings unchanged since 06/22/2018 Remaining lungs clear. No pleural effusion or pneumothorax. No acute osseous findings. IMPRESSION: Chronic bibasilar changes without definite acute infiltrate. Electronically Signed   By: Lavonia Dana M.D.   On: 08/10/2018 14:52    Procedures Procedures (including critical care time)  Medications Ordered in ED Medications  sodium chloride (PF) 0.9 % injection (has no administration in time range)  0.9 %  sodium chloride infusion (Manually program via Guardrails IV Fluids) (has no administration in time range)  aztreonam (AZACTAM) 2 g in sodium chloride 0.9 % 100 mL IVPB (has no administration in time range)  sodium chloride 0.9 % bolus 1,000 mL (0 mLs Intravenous Stopped 08/10/18 1632)  iohexol (OMNIPAQUE) 300 MG/ML solution 100 mL (80 mLs Intravenous Contrast Given  08/10/18 1554)  morphine 4 MG/ML injection 4 mg (4 mg Intravenous Given 08/10/18 1629)  ondansetron (ZOFRAN) injection 4 mg (4 mg Intravenous Given 08/10/18 1630)     Initial Impression / Assessment and Plan / ED Course  I have reviewed the triage vital signs and the nursing notes.  Pertinent  labs & imaging results that were available during my care of the patient were reviewed by me and considered in my medical decision making (see chart for details).      Pt d/w Dr. Barry Dienes (surgery) who requested medicine admission.  Surgery will follow.  G-tube can be hooked up to a foley.  Pt d/w Dr. Florene Glen (triad) who will admit.  Urine is pending, but suspect it is positive as it is cloudy.  It is a suprapubic specimen, so she is give a dose of aztreonam.  Pt has a rectus sheath hematoma and symptomatic anemia.  I ordered 2 units of blood to be transfused.  She is on lovenox.  covid swab is pending at admission, but she is asymptomatic for covid.  CRITICAL CARE Performed by: Isla Pence   Total critical care time: 30 minutes  Critical care time was exclusive of separately billable procedures and treating other patients.  Critical care was necessary to treat or prevent imminent or life-threatening deterioration.  Critical care was time spent personally by me on the following activities: development of treatment plan with patient and/or surrogate as well as nursing, discussions with consultants, evaluation of patient's response to treatment, examination of patient, obtaining history from patient or surrogate, ordering and performing treatments and interventions, ordering and review of laboratory studies, ordering and review of radiographic studies, pulse oximetry and re-evaluation of patient's condition.  Nayara Taplin was evaluated in Emergency Department on 08/10/2018 for the symptoms described in the history of present illness. She was evaluated in the context of the global COVID-19 pandemic, which necessitated consideration that the patient might be at risk for infection with the SARS-CoV-2 virus that causes COVID-19. Institutional protocols and algorithms that pertain to the evaluation of patients at risk for COVID-19 are in a state of rapid change based on information released  by regulatory bodies including the CDC and federal and state organizations. These policies and algorithms were followed during the patient's care in the ED.  Final Clinical Impressions(s) / ED Diagnoses   Final diagnoses:  Quadriplegia ([place])  SBO (small bowel obstruction) ([facility])  Hematoma of rectus sheath, initial encounter  Symptomatic anemia    ED Discharge Orders    None       Isla Pence, MD 08/10/18 1758

## 2018-08-10 NOTE — ED Notes (Signed)
Pt not displaying any signs of a reaction to the blood transfusion at this time.

## 2018-08-10 NOTE — Consult Note (Signed)
Reason for Consult:SBO Referring Physician: Dr. Lamonte Sakai Page  [facility] Angela Page is an 48 y.o. female.  HPI: This is a 48 year old female with traumatic quadriplegia status post a tracheostomy, PEG tube placement, suprapubic catheter placement etc. who has multiple chronic comorbidities.  She is on chronic tube feeds.  She was transferred from her nursing facility today because of worsening abdominal pain.  She had nausea and vomiting.  She underwent a CT scan of the abdomen and pelvis which showed a very large left rectus sheath hematoma measuring greater than 17 cm as well as a possible high-grade small bowel obstruction.  She has no prior history of bowel obstructions.  Her only surgical procedures on the abdomen have been a laparoscopic appendectomy as well as a percutaneous gastrostomy and suprapubic catheter placement.  She has normal bowel movements with her tube feeds.  Currently, she is feeling better with less discomfort.  Past Medical History:  Diagnosis Date  . Acute on chronic respiratory failure with hypoxia ([place])   . Anasarca 06/10/2016  . Asthma   . At high risk for severe sepsis   . Cocaine use 10/08/2013  . Decubitus ulcer of buttock, unstageable ([place])   . Depression   . Diabetes mellitus without complication ([place])   . GERD (gastroesophageal reflux disease)   . HCAP (healthcare-associated pneumonia) 06/09/2016  . Hepatitis    hx of hepatits frm mono   . Kidney stone   . Lobar pneumonia ([place])   . Overdose of opiate or related narcotic ([place]) 09/02/2014  . Pacemaker   . Pleurisy   . Polysubstance dependence including opioid type drug, episodic abuse ([place]) 10/27/2013  . Protein calorie malnutrition ([place]) 10/31/2015  . Quadriparesis ([place])   . Quadriplegia and quadriparesis ([place])   . Quadriplegia, C5-C7 incomplete ([place]) 10/08/2013  . Stage IV pressure ulcer of sacral region ([place]) 10/31/2015  . Tracheostomy status [facility])     Past Surgical History:  Procedure Laterality  Date  . APPENDECTOMY    . BACK SURGERY  10/08/2013   fusion of spine, cervical region  . CARDIAC SURGERY    . CYSTOSCOPY W/ URETERAL STENT PLACEMENT Right 03/09/2017   Procedure: CYSTOSCOPY [name] STENT PLACEMENT;  Surgeon: Irine Seal, MD;  Location: [facility];  Service: Urology;  Laterality: Right;  . CYSTOSCOPY W/ URETERAL STENT REMOVAL Right 07/16/2017   Procedure: CYSTOSCOPY WITH STENT REMOVAL;  Surgeon: Franchot Gallo, MD;  [facility];  Service: Urology;  Laterality: Right;  . CYSTOSCOPY/URETEROSCOPY/HOLMIUM LASER/STENT PLACEMENT Right 07/16/2017   Procedure: CYSTOSCOPY/URETEROSCOPY/HOLMIUM LASER/STENT PLACEMENT/ALSO STONE EXTRACTION;  Surgeon: Franchot Gallo, MD;  [facility];  Service: Urology;  Laterality: Right;  . GASTROSTOMY  11/23/2015  . I&D EXTREMITY  10/28/2011   Procedure: IRRIGATION AND DEBRIDEMENT EXTREMITY;  Surgeon: Tennis Must, MD;  Location: [place];  Service: Orthopedics;  Laterality: Right;  Irrigation and debridement right middle finger  . INSERTION OF SUPRAPUBIC CATHETER N/A 07/28/2016   Procedure: INSERTION OF SUPRAPUBIC CATHETER;  Surgeon: Franchot Gallo, MD;  [facility];  Service: Urology;  Laterality: N/A;  . IR GENERIC HISTORICAL  11/23/2015   IR GASTROSTOMY TUBE MOD SED 11/23/2015 [facility]  . IR PATIENT EVAL TECH 0-60 MINS  12/18/2017  . IR REPLACE G-TUBE SIMPLE WO FLUORO  12/19/2017  . LITHOTRIPSY  08/2017  . TRACHEOSTOMY      Family History  Problem Relation Age of Onset  . Hypertension Maternal Uncle   . Kidney failure Maternal Uncle   . Colon  cancer Mother   . Lung cancer Father   . Hypertension Brother   . Kidney failure Maternal Aunt   . Kidney failure Maternal Uncle   . Colon cancer Maternal Grandfather   . Esophageal cancer Neg Hx     Social History:  reports that she has quit smoking. Her smoking use included cigarettes. She has a 25.00 pack-year smoking history. She has never used smokeless  tobacco. She reports that she does not drink alcohol or use drugs.  Allergies:  Allergies  Allergen Reactions  . Penicillins Hives    Has patient had a PCN reaction causing immediate rash, facial/tongue/throat swelling, SOB or lightheadedness with hypotension: Yes Has patient had a PCN reaction causing severe rash involving mucus membranes or skin necrosis: Yes Has patient had a PCN reaction that required hospitalization No Has patient had a PCN reaction occurring within the last 10 years: No-childhood allergy If all of the above answers are "NO", then may proceed with Cephalosporin  Tolerated cephalosporins in past  . Erythromycin Nausea And Vomiting  . Nitrofurantoin Monohyd Macro Nausea And Vomiting  . Oxybutynin Other (See Comments)    Dries mouth out     Medications: I have reviewed the patient's current medications.  Results for orders placed or performed during the hospital encounter of 08/10/18 (from the past 48 hour(s))  CBC with Differential     Status: Abnormal   Collection Time: 08/10/18  2:40 PM  Result Value Ref Range   WBC 13.3 (H) 4.0 - 10.5 K/uL   RBC 2.59 (L) 3.87 - 5.11 MIL/uL   Hemoglobin 7.1 (L) 12.0 - 15.0 g/dL   HCT 23.7 (L) 36.0 - 46.0 %   MCV 91.5 80.0 - 100.0 fL   MCH 27.4 26.0 - 34.0 pg   MCHC 30.0 30.0 - 36.0 g/dL   RDW 16.1 (H) 11.5 - 15.5 %   Platelets 470 (H) 150 - 400 K/uL   nRBC 0.2 0.0 - 0.2 %   Neutrophils Relative % 86 %   Neutro Abs 11.4 (H) 1.7 - 7.7 K/uL   Lymphocytes Relative 9 %   Lymphs Abs 1.2 0.7 - 4.0 K/uL   Monocytes Relative 5 %   Monocytes Absolute 0.7 0.1 - 1.0 K/uL   Eosinophils Relative 0 %   Eosinophils Absolute 0.0 0.0 - 0.5 K/uL   Basophils Relative 0 %   Basophils Absolute 0.0 0.0 - 0.1 K/uL   Immature Granulocytes 0 %   Abs Immature Granulocytes 0.05 0.00 - 0.07 K/uL    Comment: Performed at [facility], [place] [address], [place], [place] [id number]  Comprehensive metabolic panel     Status:  Abnormal   Collection Time: 08/10/18  2:40 PM  Result Value Ref Range   Sodium 135 135 - 145 mmol/L   Potassium 4.2 3.5 - 5.1 mmol/L   Chloride 92 (L) 98 - 111 mmol/L   CO2 28 22 - 32 mmol/L   Glucose, Bld 178 (H) 70 - 99 mg/dL   BUN 17 6 - 20 mg/dL   Creatinine, Ser 0.54 0.44 - 1.00 mg/dL   Calcium 9.7 8.9 - 10.3 mg/dL   Total Protein 8.7 (H) 6.5 - 8.1 g/dL   Albumin 3.4 (L) 3.5 - 5.0 g/dL   AST 18 15 - 41 U/L   ALT 16 0 - 44 U/L   Alkaline Phosphatase 157 (H) 38 - 126 U/L   Total Bilirubin 0.7 0.3 - 1.2 mg/dL   GFR calc non Af  Amer >60 >60 mL/min   GFR calc Af Amer >60 >60 mL/min   Anion gap 15 5 - 15    Comment: Performed at [facility], [place] [address] [place], [place] [address]  Lipase, blood     Status: None   Collection Time: 08/10/18  2:40 PM  Result Value Ref Range   Lipase 19 11 - 51 U/L    Comment: Performed at [facility], [place] [address] [place], [place] [address]  hCG, quantitative, pregnancy     Status: None   Collection Time: 08/10/18  2:40 PM  Result Value Ref Range   hCG, Beta Chain, Quant, S 4 <5 mIU/mL    Comment:          GEST. AGE      CONC.  (mIU/mL)   <=1 WEEK        5 - 50     2 WEEKS       50 - 500     3 WEEKS       100 - 10,000     4 WEEKS     1,000 - 30,000     5 WEEKS     3,500 - 115,000   6-8 WEEKS     12,000 - 270,000    12 WEEKS     15,000 - 220,000        FEMALE AND NON-PREGNANT FEMALE:     LESS THAN 5 mIU/mL Performed at [facility], [place] [address] [place], [place] [address]   Lactic acid, plasma     Status: None   Collection Time: 08/10/18  2:48 PM  Result Value Ref Range   Lactic Acid, Venous 1.1 0.5 - 1.9 mmol/L    Comment: Performed at [facility], [place] [address], [place], [place] [address]  Iron and TIBC     Status: Abnormal   Collection Time: 08/10/18  3:00 PM  Result Value Ref Range   Iron 29 28 - 170 ug/dL   TIBC 313 250 - 450 ug/dL    Saturation Ratios 9 (L) 10.4 - 31.8 %   UIBC 284 ug/dL    Comment: Performed at [facility], [place] [address] [place], [place] [address]  Ferritin     Status: None   Collection Time: 08/10/18  3:00 PM  Result Value Ref Range   Ferritin 84 11 - 307 ng/mL    Comment: Performed at [facility], [place] [address], [place], [place] [address]  I-Stat Beta hCG blood, ED ([facility], [facility], [facility] only)     Status: Abnormal   Collection Time: 08/10/18  3:25 PM  Result Value Ref Range   I-stat hCG, quantitative 29.2 (H) <5 mIU/mL   Comment 3            Comment:   GEST. AGE      CONC.  (mIU/mL)   <=1 WEEK        5 - 50     2 WEEKS       50 - 500     3 WEEKS       100 - 10,000     4 WEEKS     1,000 - 30,000        FEMALE AND NON-PREGNANT FEMALE:     LESS THAN 5 mIU/mL   Type and screen [place]     Status: None (Preliminary result)   Collection Time: 08/10/18  3:38 PM  Result Value Ref Range   ABO/RH(D) A POS    Antibody Screen NEG    Sample Expiration 08/13/2018,2359    Unit Number [id number]    Blood Component Type RBC LR PHER1    Unit division 00    Status of Unit ALLOCATED    Transfusion Status OK TO TRANSFUSE    Crossmatch Result Compatible    Unit Number [id number]    Blood Component Type RBC LR PHER2    Unit division 00    Status of Unit ISSUED    Transfusion Status OK TO TRANSFUSE    Crossmatch Result      Compatible Performed at [facility], [place] [address], [place], [place] [address]   Prepare RBC     Status: None   Collection Time: 08/10/18  4:39 PM  Result Value Ref Range   Order Confirmation      ORDER PROCESSED BY BLOOD BANK Performed at [place] [address], [place], [place] [address]   SARS Coronavirus 2 (CEPHEID - Performed in [place] hospital lab), Hosp Order     Status: None   Collection Time: 08/10/18  6:20 PM   Specimen: Nasopharyngeal Swab  Result Value Ref  Range   SARS Coronavirus 2 NEGATIVE NEGATIVE    Comment: (NOTE) If result is NEGATIVE SARS-CoV-2 target nucleic acids are NOT DETECTED. The SARS-CoV-2 RNA is generally detectable in upper and lower  respiratory specimens during the acute phase of infection. The lowest  concentration of SARS-CoV-2 viral copies this assay can detect is 250  copies / mL. A negative result does not preclude SARS-CoV-2 infection  and should not be used as the sole basis for treatment or other  patient management decisions.  A negative result may occur with  improper specimen collection / handling, submission of specimen other  than nasopharyngeal swab, presence of viral mutation(s) within the  areas targeted by this assay, and inadequate number of viral copies  (<250 copies / mL). A negative result must be combined with clinical  observations, patient history, and epidemiological information. If result is POSITIVE SARS-CoV-2 target nucleic acids are DETECTED. The SARS-CoV-2 RNA is generally detectable in upper and lower  respiratory specimens dur ing the acute phase of infection.  Positive  results are indicative of active infection with SARS-CoV-2.  Clinical  correlation with patient history and other diagnostic information is  necessary to determine patient infection status.  Positive results do  not rule out bacterial infection or co-infection with other viruses. If result is PRESUMPTIVE POSTIVE SARS-CoV-2 nucleic acids MAY BE PRESENT.   A presumptive positive result was obtained on the submitted specimen  and confirmed on repeat testing.  While 2019 novel coronavirus  (SARS-CoV-2) nucleic acids may be present in the submitted sample  additional confirmatory testing may be necessary for epidemiological  and / or clinical management purposes  to differentiate between  SARS-CoV-2 and other Sarbecovirus currently known to infect humans.  If clinically indicated additional testing with an alternate test   methodology [phone]) is advised. The SARS-CoV-2 RNA is generally  detectable in upper and lower respiratory sp ecimens during the acute  phase of infection. The expected result is Negative. Fact Sheet for Patients:  StrictlyIdeas.no Fact Sheet for Healthcare Providers: BankingDealers.co.za This test is not yet approved or cleared by the [place] FDA and has been authorized for detection and/or diagnosis of SARS-CoV-2 by FDA under an Emergency Use Authorization (EUA).  This EUA will  remain in effect (meaning this test can be used) for the duration of the COVID-19 declaration under Section 564(b)(1) of the Act, 21 U.S.C. section 360bbb-3(b)(1), unless the authorization is terminated or revoked sooner. Performed at [facility], [place] [address], [place], [place] [address]     Ct Abdomen Pelvis W Contrast  Result Date: 08/10/2018 CLINICAL DATA:  Abdominal pain since this morning. EXAM: CT ABDOMEN AND PELVIS WITH CONTRAST TECHNIQUE: Multidetector CT imaging of the abdomen and pelvis was performed using the standard protocol following bolus administration of intravenous contrast. CONTRAST:  78mL OMNIPAQUE IOHEXOL 300 MG/ML  SOLN COMPARISON:  12/16/2017 FINDINGS: Lower chest: Bibasilar atelectasis and scarring changes. No definite infiltrates or effusions. The heart is normal in size. No pericardial effusion. Pacer wires are noted. Hepatobiliary: No focal hepatic lesions or intrahepatic biliary dilatation. The gallbladder demonstrates small gallstones but no findings for acute cholecystitis. No common bile duct dilatation. Pancreas: No mass, inflammation or ductal dilatation. Spleen: Normal size.  No focal lesions. Adrenals/Urinary Tract: The adrenal glands and kidneys are unremarkable. There is a lower pole left renal calculus noted but no obstructing ureteral calculi or bladder calculi. No obvious bladder lesions. Patient has a  suprapubic urinary catheter in place. Stomach/Bowel: The stomach contains a feeding gastrostomy tube. No complicating features are identified. There is mild distention of the stomach with fluid. The duodenum is unremarkable. The proximal and mid small bowel loops are dilated and demonstrate air-fluid levels consistent with obstruction. There appears to be a transition to nondilated/decompressed loops of small bowel in the upper pelvis in the midline (series 2, image 53 and series 5 image 49, 50 and 51). This is likely due to adhesions from prior surgery. Moderate stool in the colon and down into the rectum. No colonic mass or obstruction. The terminal ileum appears normal. Vascular/Lymphatic: Advanced atherosclerotic calcifications involving the aorta iliac arteries but no aneurysm. The branch vessels are patent. The major venous structures are patent. Small scattered mesenteric and retroperitoneal lymph nodes but no mass or overt adenopathy. Reproductive: The uterus and ovaries are unremarkable. Other: There is a large left rectus sheath hematoma measuring approximately 17.5 x 13.0 x 7.5 cm. Musculoskeletal: No significant bony findings. Advanced osteoporosis for age. A sacral decubitus ulcer is noted and extends down very close to the bone. I do not see any obvious destructive bony changes or ever it appears that the lower sacrum and coccyx have been previously resected. IMPRESSION: 1. CT findings consistent with a fairly high-grade small-bowel obstruction in the mid small bowel region in the central abdomen at the level of the iliac crest. This is most likely due to adhesions. 2. Very large left rectus sheath hematoma. 3. Feeding gastrostomy tube and suprapubic catheter is in good position without complicating features. 4. Sacral decubitus ulcer. 5. Marked age advanced vascular calcifications for age. 6. Lower pole left renal calculus but no obstructing ureteral calculi. Electronically Signed   By: Angela Page  M.D.   On: 08/10/2018 16:28   Dg Chest Portable 1 View  Result Date: 08/10/2018 CLINICAL DATA:  Abdominal pain since 0300 hours this morning, nausea, single episode of vomiting, abdominal distension today, quadriplegia EXAM: PORTABLE CHEST 1 VIEW COMPARISON:  Portable exam 1403 hours compared to 07/20/2018 FINDINGS: LEFT subclavian pacemaker with leads projecting over RIGHT atrium and RIGHT ventricle. Tracheostomy tube projects over tracheal air column. Normal heart size, mediastinal contours, and pulmonary vascularity. RIGHT basilar scarring. Chronic accentuation of retrocardiac LEFT lower lobe markings unchanged since 06/22/2018  Remaining lungs clear. No pleural effusion or pneumothorax. No acute osseous findings. IMPRESSION: Chronic bibasilar changes without definite acute infiltrate. Electronically Signed   By: Angela Page M.D.   On: 08/10/2018 14:52    Review of Systems  Constitutional: Negative for chills and fever.  Respiratory: Negative for cough and shortness of breath.   Cardiovascular: Negative for chest pain.  Gastrointestinal: Positive for abdominal pain, nausea and vomiting.  Genitourinary: Negative for dysuria.  All other systems reviewed and are negative.  Blood pressure 114/70, pulse 88, temperature 99.1 F (37.3 C), resp. rate 16, last menstrual period 05/27/2016, SpO2 98 %. Physical Exam  Constitutional: She is oriented to person, place, and time.  Cachectic female with contractures lying in bed but awake and alert  HENT:  Head: Normocephalic and atraumatic.  Right Ear: External ear normal.  Left Ear: External ear normal.  Nose: Nose normal.  Mouth/Throat: No oropharyngeal exudate.  Tracheostomy in place  Eyes: Pupils are equal, round, and reactive to light. Right eye exhibits no discharge. Left eye exhibits no discharge. No scleral icterus.  Neck: Normal range of motion. No tracheal deviation present.  Cardiovascular: Normal rate, regular rhythm, normal heart sounds  and intact distal pulses.  Respiratory: Effort normal and breath sounds normal. No respiratory distress. She has no wheezes.  GI:  Her abdomen is distended.  There is a gastrostomy tube in the left upper quadrant.  She is very firm along the left rectus sheath.  Her abdomen is nontender but again she has quadriplegia  Musculoskeletal:     Comments: Contractures  Neurological: She is alert and oriented to person, place, and time.  Skin: Skin is warm and dry. No erythema.  Psychiatric: Her behavior is normal. Judgment normal.    Assessment/Plan: Small bowel obstruction versus ileus Rectus sheath hematoma  I suspect this is probably an ileus from the large rectus sheath hematoma.  Currently, we will keep her gastrostomy tube to gravity bag drainage.  We will order a small bowel protocol study to be started tomorrow to help rule out an obstruction.  Currently she remain n.p.o.  Anticoagulation will be held.  Hopefully the rectus sheath hematoma will liquefy over time and will either resolve on its own or can be aspirated.  Angela Page 08/10/2018, 8:37 PM

## 2018-08-10 NOTE — ED Notes (Signed)
Pt gave verbal consent for blood transfusion. Signed in chart.

## 2018-08-11 ENCOUNTER — Inpatient Hospital Stay (HOSPITAL_COMMUNITY): Payer: Medicare Other

## 2018-08-11 LAB — GLUCOSE, CAPILLARY
Glucose-Capillary: 102 mg/dL — ABNORMAL HIGH (ref 70–99)
Glucose-Capillary: 114 mg/dL — ABNORMAL HIGH (ref 70–99)
Glucose-Capillary: 117 mg/dL — ABNORMAL HIGH (ref 70–99)
Glucose-Capillary: 120 mg/dL — ABNORMAL HIGH (ref 70–99)
Glucose-Capillary: 122 mg/dL — ABNORMAL HIGH (ref 70–99)
Glucose-Capillary: 123 mg/dL — ABNORMAL HIGH (ref 70–99)
Glucose-Capillary: 123 mg/dL — ABNORMAL HIGH (ref 70–99)
Glucose-Capillary: 134 mg/dL — ABNORMAL HIGH (ref 70–99)

## 2018-08-11 LAB — COMPREHENSIVE METABOLIC PANEL
ALT: 13 U/L (ref 0–44)
AST: 13 U/L — ABNORMAL LOW (ref 15–41)
Albumin: 3 g/dL — ABNORMAL LOW (ref 3.5–5.0)
Alkaline Phosphatase: 133 U/L — ABNORMAL HIGH (ref 38–126)
Anion gap: 13 (ref 5–15)
BUN: 14 mg/dL (ref 6–20)
CO2: 24 mmol/L (ref 22–32)
Calcium: 8.9 mg/dL (ref 8.9–10.3)
Chloride: 99 mmol/L (ref 98–111)
Creatinine, Ser: 0.55 mg/dL (ref 0.44–1.00)
GFR calc Af Amer: >60 mL/min (ref >60)
GFR calc non Af Amer: >60 mL/min (ref >60)
Glucose, Bld: 145 mg/dL — ABNORMAL HIGH (ref 70–99)
Potassium: 3.8 mmol/L (ref 3.5–5.1)
Sodium: 136 mmol/L (ref 135–145)
Total Bilirubin: 0.4 mg/dL (ref 0.3–1.2)
Total Protein: 7.8 g/dL (ref 6.5–8.1)

## 2018-08-11 LAB — MRSA PCR SCREENING: MRSA by PCR: POSITIVE — AB

## 2018-08-11 LAB — LACTIC ACID, PLASMA: Lactic Acid, Venous: 0.9 mmol/L (ref 0.5–1.9)

## 2018-08-11 LAB — CBC
HCT: 27.5 % — ABNORMAL LOW (ref 36.0–46.0)
Hemoglobin: 8.1 g/dL — ABNORMAL LOW (ref 12.0–15.0)
MCH: 27.1 pg (ref 26.0–34.0)
MCHC: 29.5 g/dL — ABNORMAL LOW (ref 30.0–36.0)
MCV: 92 fL (ref 80.0–100.0)
Platelets: 457 10*3/uL — ABNORMAL HIGH (ref 150–400)
RBC: 2.99 MIL/uL — ABNORMAL LOW (ref 3.87–5.11)
RDW: 15.9 % — ABNORMAL HIGH (ref 11.5–15.5)
WBC: 12.4 10*3/uL — ABNORMAL HIGH (ref 4.0–10.5)
nRBC: 0.2 % (ref 0.0–0.2)

## 2018-08-11 MED ORDER — PROMETHAZINE HCL 25 MG/ML IJ SOLN
12.5000 mg | Freq: Once | INTRAMUSCULAR | Status: AC
  Administered 2018-08-11: 12.5 mg via INTRAVENOUS
  Filled 2018-08-11: qty 1

## 2018-08-11 MED ORDER — LORAZEPAM 2 MG/ML IJ SOLN
0.5000 mg | Freq: Once | INTRAMUSCULAR | Status: AC
  Administered 2018-08-12: 0.5 mg via INTRAVENOUS
  Filled 2018-08-11: qty 1

## 2018-08-11 MED ORDER — MUPIROCIN 2 % EX OINT
1.0000 "application " | TOPICAL_OINTMENT | Freq: Two times a day (BID) | CUTANEOUS | Status: AC
  Administered 2018-08-11 – 2018-08-15 (×10): 1 via NASAL
  Filled 2018-08-11: qty 22

## 2018-08-11 MED ORDER — CHLORHEXIDINE GLUCONATE CLOTH 2 % EX PADS
6.0000 | MEDICATED_PAD | Freq: Every day | CUTANEOUS | Status: AC
  Administered 2018-08-11 – 2018-08-15 (×4): 6 via TOPICAL

## 2018-08-11 NOTE — Progress Notes (Signed)
MRSA PCR (+). Standing orders initiated.

## 2018-08-11 NOTE — Progress Notes (Signed)
Patient had one episode of vomiting after being laid back flat to reposition.  Still having abdominal pain and nausea.  Xray now complete - PEG hooked back to drainage

## 2018-08-11 NOTE — Progress Notes (Signed)
Pt has no practitioner attestation for blood infusion. This RN received verbal order from Schorr NP to transfuse the second unit of PRBCs since the first was already transfused without it. Will advise MD in AM that order was never received. Will continue to monitor the pt.

## 2018-08-11 NOTE — Progress Notes (Signed)
[facility] Progress Note     Subjective: CC-  Continues to have some abdominal pain and bloating, but states that she feels a little better. Nausea/vomiting resolved. She did have a large BM last night, no flatus today.  She did not receive gastrograffin last night.  Objective: Vital signs in last 24 hours: Temp:  [98 F (36.7 C)-99.9 F (37.7 C)] 98 F (36.7 C) (07/22 0445) Pulse Rate:  [81-99] 87 (07/22 0804) Resp:  [12-18] 16 (07/22 0804) BP: (93-138)/(68-94) 128/80 (07/22 0445) SpO2:  [97 %-100 %] 100 % (07/22 0804) FiO2 (%):  [28 %] 28 % (07/22 0804) Weight:  [47.5 kg] 47.5 kg (07/22 0024) Last BM Date: 08/11/18  Intake/Output from previous day: 07/21 0701 - 07/22 0700 In: 2048.1 [I.V.:45.1; Blood:903; IV Piggyback:1100] Out: 700 [Urine:700] Intake/Output this shift: No intake/output data recorded.  PE: Gen:  Alert, NAD, pleasant HEENT: EOM's intact, pupils equal and round. Trach in place Pulm:  Rate and effort normal Abd: somewhat firm, distended, nontender, G-tube LUQ Ext:  Contractures BUE/BLE, quadriplegic Skin: warm and dry  Lab Results:  Recent Labs    08/10/18 1440 08/11/18 0038  WBC 13.3* 12.4*  HGB 7.1* 8.1*  HCT 23.7* 27.5*  PLT 470* 457*   BMET Recent Labs    08/10/18 1440 08/11/18 0038  NA 135 136  K 4.2 3.8  CL 92* 99  CO2 28 24  GLUCOSE 178* 145*  BUN 17 14  CREATININE 0.54 0.55  CALCIUM 9.7 8.9   PT/INR No results for input(s): LABPROT, INR in the last 72 hours. CMP     Component Value Date/Time   NA 136 08/11/2018 0038   NA 136 (A) 09/23/2016   K 3.8 08/11/2018 0038   CL 99 08/11/2018 0038   CL 85 09/23/2016   CO2 24 08/11/2018 0038   GLUCOSE 145 (H) 08/11/2018 0038   BUN 14 08/11/2018 0038   BUN 35 (A) 09/23/2016   CREATININE 0.55 08/11/2018 0038   CALCIUM 8.9 08/11/2018 0038   CALCIUM 10.4 09/23/2016   PROT 7.8 08/11/2018 0038   PROT 8.1 09/23/2016   ALBUMIN 3.0 (L) 08/11/2018 0038   ALBUMIN 4.3  09/23/2016   AST 13 (L) 08/11/2018 0038   ALT 13 08/11/2018 0038   ALKPHOS 133 (H) 08/11/2018 0038   ALKPHOS 195 09/23/2016   BILITOT 0.4 08/11/2018 0038   BILITOT 0.2 09/23/2016   GFRNONAA >60 08/11/2018 0038   GFRAA >60 08/11/2018 0038   Lipase     Component Value Date/Time   LIPASE 19 08/10/2018 1440       Studies/Results: Ct Abdomen Pelvis W Contrast  Result Date: 08/10/2018 CLINICAL DATA:  Abdominal pain since this morning. EXAM: CT ABDOMEN AND PELVIS WITH CONTRAST TECHNIQUE: Multidetector CT imaging of the abdomen and pelvis was performed using the standard protocol following bolus administration of intravenous contrast. CONTRAST:  80mL OMNIPAQUE IOHEXOL 300 MG/ML  SOLN COMPARISON:  12/16/2017 FINDINGS: Lower chest: Bibasilar atelectasis and scarring changes. No definite infiltrates or effusions. The heart is normal in size. No pericardial effusion. Pacer wires are noted. Hepatobiliary: No focal hepatic lesions or intrahepatic biliary dilatation. The gallbladder demonstrates small gallstones but no findings for acute cholecystitis. No common bile duct dilatation. Pancreas: No mass, inflammation or ductal dilatation. Spleen: Normal size.  No focal lesions. Adrenals/Urinary Tract: The adrenal glands and kidneys are unremarkable. There is a lower pole left renal calculus noted but no obstructing ureteral calculi or bladder calculi. No obvious bladder lesions. Patient has  a suprapubic urinary catheter in place. Stomach/Bowel: The stomach contains a feeding gastrostomy tube. No complicating features are identified. There is mild distention of the stomach with fluid. The duodenum is unremarkable. The proximal and mid small bowel loops are dilated and demonstrate air-fluid levels consistent with obstruction. There appears to be a transition to nondilated/decompressed loops of small bowel in the upper pelvis in the midline (series 2, image 53 and series 5 image 49, 50 and 51). This is likely due  to adhesions from prior surgery. Moderate stool in the colon and down into the rectum. No colonic mass or obstruction. The terminal ileum appears normal. Vascular/Lymphatic: Advanced atherosclerotic calcifications involving the aorta iliac arteries but no aneurysm. The branch vessels are patent. The major venous structures are patent. Small scattered mesenteric and retroperitoneal lymph nodes but no mass or overt adenopathy. Reproductive: The uterus and ovaries are unremarkable. Other: There is a large left rectus sheath hematoma measuring approximately 17.5 x 13.0 x 7.5 cm. Musculoskeletal: No significant bony findings. Advanced osteoporosis for age. A sacral decubitus ulcer is noted and extends down very close to the bone. I do not see any obvious destructive bony changes or ever it appears that the lower sacrum and coccyx have been previously resected. IMPRESSION: 1. CT findings consistent with a fairly high-grade small-bowel obstruction in the mid small bowel region in the central abdomen at the level of the iliac crest. This is most likely due to adhesions. 2. Very large left rectus sheath hematoma. 3. Feeding gastrostomy tube and suprapubic catheter is in good position without complicating features. 4. Sacral decubitus ulcer. 5. Marked age advanced vascular calcifications for age. 6. Lower pole left renal calculus but no obstructing ureteral calculi. Electronically Signed   By: Marijo Sanes M.D.   On: 08/10/2018 16:28   Dg Chest Portable 1 View  Result Date: 08/10/2018 CLINICAL DATA:  Abdominal pain since 0300 hours this morning, nausea, single episode of vomiting, abdominal distension today, quadriplegia EXAM: PORTABLE CHEST 1 VIEW COMPARISON:  Portable exam 1403 hours compared to 07/20/2018 FINDINGS: LEFT subclavian pacemaker with leads projecting over RIGHT atrium and RIGHT ventricle. Tracheostomy tube projects over tracheal air column. Normal heart size, mediastinal contours, and pulmonary  vascularity. RIGHT basilar scarring. Chronic accentuation of retrocardiac LEFT lower lobe markings unchanged since 06/22/2018 Remaining lungs clear. No pleural effusion or pneumothorax. No acute osseous findings. IMPRESSION: Chronic bibasilar changes without definite acute infiltrate. Electronically Signed   By: Lavonia Dana M.D.   On: 08/10/2018 14:52    Anti-infectives: Anti-infectives (From admission, onward)   Start     Dose/Rate Route Frequency Ordered Stop   08/11/18 0200  cefTRIAXone (ROCEPHIN) 1 g in sodium chloride 0.9 % 100 mL IVPB     1 g 200 mL/hr over 30 Minutes Intravenous Every 24 hours 08/10/18 1921     08/10/18 1800  aztreonam (AZACTAM) 2 g in sodium chloride 0.9 % 100 mL IVPB     2 g 200 mL/hr over 30 Minutes Intravenous  Once 08/10/18 1756 08/10/18 2011       Assessment/Plan Quadriplegia s/p trach and PEG and SP catheter DM Depression Chronic hypotension COPD Sacral decubitus ulcer Anemia - Hgb 8.1 from 7.1 UTI - on rocephin  SBO vs ileus from rectus sheath hematoma - CT scan 7/21 shows SBO, large left rectus sheath hematoma 17.5 x 13.0 x 7.5 cm  - Small bowel protocol in place. Continue G-tube to gravity. Will follow up on delayed abdominal film.  ID - rocephin  7/22>> FEN - IVF, NPO, G-tube to gravity VTE - SCDs, lovenox on hold due to rectus sheath hematoma  Foley - chronic SP catheter Follow up - TBD   LOS: 1 day    Wellington Hampshire , [facility] 08/11/2018, 9:19 AM Pager: [phone] Mon-Thurs 7:00 am-4:30 pm Fri 7:00 am -11:30 AM Sat-Sun 7:00 am-11:30 am

## 2018-08-11 NOTE — Progress Notes (Signed)
Patient has refused to be turned through out shift today.  Patient verbalizes knowledge that she knows she should turn, but says that it is too uncomfortable for you arms and shoulders.  Wishes to stay supine.  Bilateral heels have foam protectors and heels elevated off bed.  Pillow between knees as well.

## 2018-08-11 NOTE — Progress Notes (Signed)
Patients mother [name] called and updated

## 2018-08-11 NOTE — Progress Notes (Signed)
PROGRESS NOTE    Angela Page  EVO:[id number] DOB: [birth year] DOA: 08/10/2018 PCP: Default, Provider, MD   Brief Narrative: 48 y.o. female with medical history significant of traumatic C spine fracture resulting in quadriplegia s/p trach, peg, suprapubic catheter as well as COPD, chronic hypotension, T2DM, chronic pain, SSS s/p pacemaker, and chronic decubitus ulcers presenting with abdominal pain.  She notes he sx started last night after tube feeds were starting.  She developed worsening abdominal pain, nausea, and distension.  She presented to the ED due to worsening abdominal sx.  No vomiting until in ED.  No F, chills, CP, SOB.  Continues to have BM.  No known COVID exposures.   ED Course: Imaging concerning for SBO and rectus sheath hematoma.  Labs with anemia.  Surgery c/s.  Labs, imaging.  Abx for possible UTI.  Hospitalist to admit for SBO, rectus sheath hematoma, and possible UTI.   Subjective: Resting comfortably.  Reports pain is controlled in her abdomen.  No nausea or vomiting. On trach collar with 5 L oxygen, G-tube to gravity.  Assessment & Plan:  SBO versus ileus from rectus sheath  Hematoma: Had a bowel movement last night.  Appreciate surgery input.  Remains on a small bowel protocol, continue G-tube to gravity, follow-up on abdominal film.  Rectus sheath hematoma, Lovenox has been discontinued.  CT scan showed hematoma 17.5 x 13.0 x 7.5 cm.  Hemoglobin at 8.1 g.  Monitor hemoglobin and transfuse as needed.  Doubt she is a candidate for anticoagulation on long-term.  Acute blood blood loss anemia secondary to hematoma.  Baseline hemoglobin around 9.8 g.  Status post 2 unit PRBC transfusion. up at 8.1 g.  Monitor hemoglobin closely, Recent Labs  Lab 08/10/18 1440 08/11/18 0038  HGB 7.1* 8.1*  HCT 23.7* 27.5*   Quadriplegia with trach and PEG and SP catheter in place.  Continue supportive care, trach collar, supplemental oxygen parameters.Baclofen 5 mg tid,  gabapentin 300 mg tid  UTI POA on Rocephin.  Blood culture pending.  Urine culture pending.  Bed sore in buttock and sacrum.  Continue wound care.  Continue offloading,  T2DM: SSI q4 while npo.  Hold home meds.  Depression   Insomnia: continue cymbalta, wellbutrin  Chronic Hypotension:patient reports midodrine was d/c'd, continue to monitor off midodrine.  Pressure fairly stable.  Hx COPD: continue home meds  Elevated istat hcg: repeat serum HCG was 4 (ref <5)  DVT prophylaxis: SCD Code Status: Full code Family Communication: plan of care discussed with patient in detail. Disposition Plan: Remains inpatient pending clinical improvement. Expected discharge to skilled nursing facility.  Consultants:  General surgery Procedures: None Microbiology: Urine culture pending Antimicrobials: Anti-infectives (From admission, onward)   Start     Dose/Rate Route Frequency Ordered Stop   08/11/18 0200  cefTRIAXone (ROCEPHIN) 1 g in sodium chloride 0.9 % 100 mL IVPB     1 g 200 mL/hr over 30 Minutes Intravenous Every 24 hours 08/10/18 1921     08/10/18 1800  aztreonam (AZACTAM) 2 g in sodium chloride 0.9 % 100 mL IVPB     2 g 200 mL/hr over 30 Minutes Intravenous  Once 08/10/18 1756 08/10/18 2011       Objective: Vitals:   08/11/18 0316 08/11/18 0400 08/11/18 0445 08/11/18 0804  BP: (!) 111/94  128/80   Pulse: 93 96 81 87  Resp: 14 16 16 16   Temp: 99.4 F (37.4 C)  98 F (36.7 C)   TempSrc: Oral  Oral  SpO2: 97% 98% 99% 100%  Weight:      Height:        Intake/Output Summary (Last 24 hours) at 08/11/2018 0930 Last data filed at 08/11/2018 0316 Gross per 24 hour  Intake 2048.07 ml  Output 700 ml  Net 1348.07 ml   Filed Weights   08/11/18 0024  Weight: 47.5 kg   Weight change:   Body mass index is 19.15 kg/m.  Intake/Output from previous day: 07/21 0701 - 07/22 0700 In: 2048.1 [I.V.:45.1; Blood:903; IV Piggyback:1100] Out: 700 [Urine:700] Intake/Output  this shift: No intake/output data recorded.  Examination:  General exam: Appears calm and comfortable, alert, NAD HEENT:PERRL,Oral mucosa moist, Ear/Nose normal on gross exam, trahc+ w trach collar- 5l o2 Respiratory system: Bilateral equal air entry, normal vesicular breath sounds, no wheezes or crackles  Cardiovascular system: S1 & S2 heard,No JVD, murmurs. Gastrointestinal system: Abdomen is  soft, non tender, J tube + to gravity, SP tube+, moderately distended and tender,BS minmal  Nervous System:Alert and oriented. LE weakness, contractures+ Extremities: No edema, no clubbing, distal peripheral pulses palpable. Skin: No rashes, lesions, no icterus MSK: Normal muscle bulk,tone ,power Bed sore in buttock and sacrum.   Medications:  Scheduled Meds:  sodium chloride   Intravenous Once   buPROPion  150 mg Oral Daily   Chlorhexidine Gluconate Cloth  6 each Topical Q0600   diatrizoate meglumine-sodium  90 mL Per NG tube Once   DULoxetine  30 mg Oral BID   gabapentin  300 mg Oral TID   insulin aspart  0-9 Units Subcutaneous Q4H   mupirocin ointment  1 application Nasal BID   umeclidinium bromide  1 puff Inhalation Daily   zolpidem  2.5 mg Oral QHS   Continuous Infusions:  cefTRIAXone (ROCEPHIN)  IV 1 g (08/11/18 0324)   lactated ringers 50 mL/hr at 08/11/18 0401    Data Reviewed: I have personally reviewed following labs and imaging studies  CBC: Recent Labs  Lab 08/10/18 1440 08/11/18 0038  WBC 13.3* 12.4*  NEUTROABS 11.4*  --   HGB 7.1* 8.1*  HCT 23.7* 27.5*  MCV 91.5 92.0  PLT 470* 109*   Basic Metabolic Panel: Recent Labs  Lab 08/10/18 1440 08/11/18 0038  NA 135 136  K 4.2 3.8  CL 92* 99  CO2 28 24  GLUCOSE 178* 145*  BUN 17 14  CREATININE 0.54 0.55  CALCIUM 9.7 8.9   GFR: Estimated Creatinine Clearance: 65.2 mL/min (by C-G formula based on SCr of 0.55 mg/dL). Liver Function Tests: Recent Labs  Lab 08/10/18 1440 08/11/18 0038  AST 18  13*  ALT 16 13  ALKPHOS 157* 133*  BILITOT 0.7 0.4  PROT 8.7* 7.8  ALBUMIN 3.4* 3.0*   Recent Labs  Lab 08/10/18 1440  LIPASE 19   No results for input(s): AMMONIA in the last 168 hours. Coagulation Profile: No results for input(s): INR, PROTIME in the last 168 hours. Cardiac Enzymes: No results for input(s): CKTOTAL, CKMB, CKMBINDEX, TROPONINI in the last 168 hours. BNP (last 3 results) No results for input(s): PROBNP in the last 8760 hours. HbA1C: No results for input(s): HGBA1C in the last 72 hours. CBG: Recent Labs  Lab 08/10/18 2206 08/11/18 0040 08/11/18 0443 08/11/18 0850  GLUCAP 115* 134* 117* 123*   Lipid Profile: No results for input(s): CHOL, HDL, LDLCALC, TRIG, CHOLHDL, LDLDIRECT in the last 72 hours. Thyroid Function Tests: No results for input(s): TSH, T4TOTAL, FREET4, T3FREE, THYROIDAB in the last 72 hours. Anemia Panel: Recent  Labs    08/10/18 1500  VITAMINB12 493  FOLATE 24.0  FERRITIN 84  TIBC 313  IRON 29   Sepsis Labs: Recent Labs  Lab 08/10/18 1448 08/11/18 0038  LATICACIDVEN 1.1 0.9    Recent Results (from the past 240 hour(s))  SARS Coronavirus 2 (CEPHEID - Performed in [facility] hospital lab), Hosp Order     Status: None   Collection Time: 08/10/18  6:20 PM   Specimen: Nasopharyngeal Swab  Result Value Ref Range Status   SARS Coronavirus 2 NEGATIVE NEGATIVE Final    Comment: (NOTE) If result is NEGATIVE SARS-CoV-2 target nucleic acids are NOT DETECTED. The SARS-CoV-2 RNA is generally detectable in upper and lower  respiratory specimens during the acute phase of infection. The lowest  concentration of SARS-CoV-2 viral copies this assay can detect is 250  copies / mL. A negative result does not preclude SARS-CoV-2 infection  and should not be used as the sole basis for treatment or other  patient management decisions.  A negative result may occur with  improper specimen collection / handling, submission of specimen other    than nasopharyngeal swab, presence of viral mutation(s) within the  areas targeted by this assay, and inadequate number of viral copies  (<250 copies / mL). A negative result must be combined with clinical  observations, patient history, and epidemiological information. If result is POSITIVE SARS-CoV-2 target nucleic acids are DETECTED. The SARS-CoV-2 RNA is generally detectable in upper and lower  respiratory specimens dur ing the acute phase of infection.  Positive  results are indicative of active infection with SARS-CoV-2.  Clinical  correlation with patient history and other diagnostic information is  necessary to determine patient infection status.  Positive results do  not rule out bacterial infection or co-infection with other viruses. If result is PRESUMPTIVE POSTIVE SARS-CoV-2 nucleic acids MAY BE PRESENT.   A presumptive positive result was obtained on the submitted specimen  and confirmed on repeat testing.  While 2019 novel coronavirus  (SARS-CoV-2) nucleic acids may be present in the submitted sample  additional confirmatory testing may be necessary for epidemiological  and / or clinical management purposes  to differentiate between  SARS-CoV-2 and other Sarbecovirus currently known to infect humans.  If clinically indicated additional testing with an alternate test  methodology [phone]) is advised. The SARS-CoV-2 RNA is generally  detectable in upper and lower respiratory sp ecimens during the acute  phase of infection. The expected result is Negative. Fact Sheet for Patients:  StrictlyIdeas.no Fact Sheet for Healthcare Providers: BankingDealers.co.za This test is not yet approved or cleared by the [place] FDA and has been authorized for detection and/or diagnosis of SARS-CoV-2 by FDA under an Emergency Use Authorization (EUA).  This EUA will remain in effect (meaning this test can be used) for the duration of  the COVID-19 declaration under Section 564(b)(1) of the Act, 21 U.S.C. section 360bbb-3(b)(1), unless the authorization is terminated or revoked sooner. Performed at [facility], [place] [address], [place], [place] [address]   MRSA PCR Screening     Status: Abnormal   Collection Time: 08/11/18  1:42 AM   Specimen: Nasal Mucosa; Nasopharyngeal  Result Value Ref Range Status   MRSA by PCR POSITIVE (A) NEGATIVE Final    Comment:        The GeneXpert MRSA Assay (FDA approved for NASAL specimens only), is one component of a comprehensive MRSA colonization surveillance program. It is not intended to diagnose MRSA infection nor  to guide or monitor treatment for MRSA infections. RESULT CALLED TO, READ BACK BY AND VERIFIED WITH: ELLIS,C @ 0425 ON [id number] BY POTEAT,S Performed at [facility] [address] [place], [place] [address]       Radiology Studies: Ct Abdomen Pelvis W Contrast  Result Date: 08/10/2018 CLINICAL DATA:  Abdominal pain since this morning. EXAM: CT ABDOMEN AND PELVIS WITH CONTRAST TECHNIQUE: Multidetector CT imaging of the abdomen and pelvis was performed using the standard protocol following bolus administration of intravenous contrast. CONTRAST:  65mL OMNIPAQUE IOHEXOL 300 MG/ML  SOLN COMPARISON:  12/16/2017 FINDINGS: Lower chest: Bibasilar atelectasis and scarring changes. No definite infiltrates or effusions. The heart is normal in size. No pericardial effusion. Pacer wires are noted. Hepatobiliary: No focal hepatic lesions or intrahepatic biliary dilatation. The gallbladder demonstrates small gallstones but no findings for acute cholecystitis. No common bile duct dilatation. Pancreas: No mass, inflammation or ductal dilatation. Spleen: Normal size.  No focal lesions. Adrenals/Urinary Tract: The adrenal glands and kidneys are unremarkable. There is a lower pole left renal calculus noted but no obstructing ureteral calculi or bladder  calculi. No obvious bladder lesions. Patient has a suprapubic urinary catheter in place. Stomach/Bowel: The stomach contains a feeding gastrostomy tube. No complicating features are identified. There is mild distention of the stomach with fluid. The duodenum is unremarkable. The proximal and mid small bowel loops are dilated and demonstrate air-fluid levels consistent with obstruction. There appears to be a transition to nondilated/decompressed loops of small bowel in the upper pelvis in the midline (series 2, image 53 and series 5 image 49, 50 and 51). This is likely due to adhesions from prior surgery. Moderate stool in the colon and down into the rectum. No colonic mass or obstruction. The terminal ileum appears normal. Vascular/Lymphatic: Advanced atherosclerotic calcifications involving the aorta iliac arteries but no aneurysm. The branch vessels are patent. The major venous structures are patent. Small scattered mesenteric and retroperitoneal lymph nodes but no mass or overt adenopathy. Reproductive: The uterus and ovaries are unremarkable. Other: There is a large left rectus sheath hematoma measuring approximately 17.5 x 13.0 x 7.5 cm. Musculoskeletal: No significant bony findings. Advanced osteoporosis for age. A sacral decubitus ulcer is noted and extends down very close to the bone. I do not see any obvious destructive bony changes or ever it appears that the lower sacrum and coccyx have been previously resected. IMPRESSION: 1. CT findings consistent with a fairly high-grade small-bowel obstruction in the mid small bowel region in the central abdomen at the level of the iliac crest. This is most likely due to adhesions. 2. Very large left rectus sheath hematoma. 3. Feeding gastrostomy tube and suprapubic catheter is in good position without complicating features. 4. Sacral decubitus ulcer. 5. Marked age advanced vascular calcifications for age. 6. Lower pole left renal calculus but no obstructing ureteral  calculi. Electronically Signed   By: Marijo Sanes M.D.   On: 08/10/2018 16:28   Dg Chest Portable 1 View  Result Date: 08/10/2018 CLINICAL DATA:  Abdominal pain since 0300 hours this morning, nausea, single episode of vomiting, abdominal distension today, quadriplegia EXAM: PORTABLE CHEST 1 VIEW COMPARISON:  Portable exam 1403 hours compared to 07/20/2018 FINDINGS: LEFT subclavian pacemaker with leads projecting over RIGHT atrium and RIGHT ventricle. Tracheostomy tube projects over tracheal air column. Normal heart size, mediastinal contours, and pulmonary vascularity. RIGHT basilar scarring. Chronic accentuation of retrocardiac LEFT lower lobe markings unchanged since 06/22/2018 Remaining lungs clear. No pleural effusion  or pneumothorax. No acute osseous findings. IMPRESSION: Chronic bibasilar changes without definite acute infiltrate. Electronically Signed   By: Lavonia Dana M.D.   On: 08/10/2018 14:52      LOS: 1 day   Time spent: More than 50% of that time was spent in counseling and/or coordination of care.  Antonieta Pert, MD [facility]  08/11/2018, 9:30 AM

## 2018-08-11 NOTE — Progress Notes (Signed)
Pt. transported from ED to 4E/08 uneventfully on ATC mask/8lpm, RT s/u all essential equipment in room with [facility] sheet posted.

## 2018-08-11 NOTE — NC FL2 (Signed)
[place] LEVEL OF CARE SCREENING TOOL     IDENTIFICATION  Patient Name: Angela Page Birthdate: 04/19/1970 Sex: female Admission Date (Current Location): 08/10/2018  [place] and [place] Number:  Kathleen Argue [id number] [place] and Address:  [facility],  [place] [address] [place]      Provider Number: [id number]  Attending Physician Name and Address:  Antonieta Pert, MD  Relative Name and Phone Number:  [name], Mother, [place], Brother, [phone]    Current Level of Care: Hospital Recommended Level of Care: [place] Prior Approval Number:    Date Approved/Denied:   PASRR Number: [id number]  Discharge Plan: SNF    Current Diagnoses: Patient Active Problem List   Diagnosis Date Noted  . SBO (small bowel obstruction) ([place]) 08/10/2018  . Hematoma of rectus sheath   . Acute encephalopathy 07/20/2018  . Hypercalcemia 06/23/2018  . Thrombocytosis ([place]) 06/22/2018  . Chronic respiratory failure with hypoxia ([place])   . Acute on chronic respiratory failure ([place])   . Quadriplegia and quadriparesis ([place])   . At high risk for severe sepsis   . Decubitus ulcer of buttock, unstageable ([place])   . Lactic acid acidosis 12/16/2017  . Suprapubic catheter ([place]) 12/16/2017  . Pressure injury of skin 07/17/2017  . Kidney stone 07/16/2017  . Right ureteral stone 03/10/2017  . Complicated UTI (urinary tract infection) 03/09/2017  . Ineffective airway clearance   . Sepsis secondary to UTI ([place]) 06/30/2016  . Autonomic neuropathy 06/10/2016  . Presence of permanent cardiac pacemaker 06/10/2016  . 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 Chronically on opiate therapy 06/10/2016  . PEG (percutaneous endoscopic gastrostomy) status ([place]) 06/10/2016  . Pulmonary hypertension ([place]) 06/10/2016  . Hypoalbuminemia 06/10/2016  . Peripheral edema   . Tracheostomy dependence ([place])   . Chronic  obstructive pulmonary disease ([place])   . Chronic diastolic CHF (congestive heart failure) ([place]) 01/23/2016  . Tracheostomy status ([place])   . Esophageal dysphagia   . Malnutrition of moderate degree 11/01/2015  . Tobacco abuse 10/31/2015  . Asthma 10/31/2015  . History of pneumonia 10/08/2015  . Pressure ulcer of contiguous region involving buttock and hip, stage 4 ([place]) 02/26/2015  . Symptomatic anemia 02/25/2015  . Altered mental status 10/29/2014  . Acute metabolic encephalopathy 19/37/9024  . Leukocytosis 10/23/2014  . Hypokalemia 10/23/2014  . Decubitus ulcer of sacral region, stage 4 ([place]) 10/23/2014  . Anemia, iron deficiency 10/23/2014  . Quadriplegia ([place]) 10/22/2014  . Depression   . Protein-calorie malnutrition, severe ([place]) 09/05/2014  . 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 Recurrent UTI 03/31/2014  . Pressure ulcer of coccygeal region, stage 4 ([place]) 01/06/2014  . Stage 4 skin ulcer of sacral region ([place]) 01/06/2014  . Neuropathic pain 11/30/2013  . Paraplegia following spinal cord injury ([place]) 11/30/2013  . PEG (percutaneous endoscopic gastrostomy) adjustment/replacement/removal ([place]) 11/30/2013  . Spinal cord injury, cervical region ([place]) 11/30/2013  . Chronic pain due to injury 11/30/2013  . S/P cervical spinal fusion 10/27/2013  . Tracheostomy care ([place]) 10/27/2013  . 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 SCI (spinal cord injury) 10/11/2013    Orientation RESPIRATION BLADDER Height & Weight     Self, Time, Situation, Place  Tracheostomy, O2(trach collar to 02 5l.) Incontinent Weight: 47.5 kg Height:  5\' 2"  (157.5 cm)  BEHAVIORAL SYMPTOMS/MOOD NEUROLOGICAL BOWEL NUTRITION STATUS      Continent Diet, Feeding tube(Peg-TF-jevity;reg diet also)  AMBULATORY STATUS COMMUNICATION OF NEEDS Skin   Total Care  Verbally Skin abrasions(unstageable chronic sacral, ishial decubitus ulcers)                       Personal Care Assistance Level of  Assistance  Bathing, Feeding, Dressing, Total care Bathing Assistance: Maximum assistance Feeding assistance: Maximum assistance Dressing Assistance: Maximum assistance Total Care Assistance: Maximum assistance   Functional Limitations Info    Sight Info: Adequate Hearing Info: Adequate Speech Info: Adequate    SPECIAL CARE FACTORS FREQUENCY  PT (By licensed PT), OT (By licensed OT)     PT Frequency: 5x/wk OT Frequency: 5x/wk            Contractures Contractures Info: Not present    Additional Factors Info  Code Status, Allergies, Insulin Sliding Scale Code Status Info: Full Code Allergies Info: Penicillins, Erythromycin, Nitrofurantoin Monohyd Macro, Oxybutynin   Insulin Sliding Scale Info: ssi       Current Medications (08/11/2018):  This is the current hospital active medication list Current Facility-Administered Medications  Medication Dose Route Frequency Provider Last Rate Last Dose  . 0.9 %  sodium chloride infusion (Manually program via Guardrails IV Fluids)   Intravenous Once Elodia Florence., MD      . baclofen (LIORESAL) tablet 5 mg  5 mg Oral TID PRN Elodia Florence., MD      . buPROPion [facility] SR) 12 hr tablet 150 mg  150 mg Oral Daily Elodia Florence., MD      . cefTRIAXone (ROCEPHIN) 1 g in sodium chloride 0.9 % 100 mL IVPB  1 g Intravenous Q24H Elodia Florence., MD 200 mL/hr at 08/11/18 0324 1 g at 08/11/18 0324  . Chlorhexidine Gluconate Cloth 2 % PADS 6 each  6 each Topical Q0600 Elodia Florence., MD   6 each at 08/11/18 0448  . DULoxetine (CYMBALTA) DR capsule 30 mg  30 mg Oral BID Elodia Florence., MD      . gabapentin (NEURONTIN) capsule 300 mg  300 mg Oral TID Elodia Florence., MD      . insulin aspart (novoLOG) injection 0-9 Units  0-9 Units Subcutaneous Q4H Elodia Florence., MD   1 Units at 08/11/18 [phone]  . lactated ringers infusion   Intravenous Continuous Elodia Florence., MD 50 mL/hr at  08/11/18 0401    . morphine 2 MG/ML injection 1 mg  1 mg Intravenous Q4H PRN Elodia Florence., MD   1 mg at 08/11/18 0934  . mupirocin ointment (BACTROBAN) 2 % 1 application  1 application Nasal BID Elodia Florence., MD   1 application at 01/28/30 [phone]  . ondansetron (ZOFRAN) injection 4 mg  4 mg Intravenous Q6H PRN Elodia Florence., MD      . umeclidinium bromide (INCRUSE ELLIPTA) 62.5 MCG/INH 1 puff  1 puff Inhalation Daily Elodia Florence., MD   1 puff at 08/11/18 3220  . zolpidem (AMBIEN) tablet 2.5 mg  2.5 mg Oral QHS Elodia Florence., MD         Discharge Medications: Please see discharge summary for a list of discharge medications.  Relevant Imaging Results:  Relevant Lab Results:   Additional Information SSN: [id number]  Dessa Phi, RN

## 2018-08-11 NOTE — Progress Notes (Signed)
Patient asking about taking PO gabapentin as hands are hurting.  OK'd be surgery to give PO med while PEG is clamped

## 2018-08-11 NOTE — TOC Initial Note (Signed)
Transition of Care [facility]) - Initial/Assessment Note    Patient Details  Name: Angela Page MRN: [id number] Date of Birth: 13-Oct-1970  [facility] of [facility]) CM/SW Contact:    Dessa Phi, RN Phone Number: 08/11/2018, 2:16 PM  Clinical Narrative: [facility] from [facility] SNF-spoke to rep Bryson Ha able to accept back @ d/c. No PT/OT cons needed-quadriplegia. Trach collar t 02,peg tube. Facility will get own Josem Kaufmann if needed. fl2 faxed to [facility].                  Expected Discharge Plan: [place] Barriers to Discharge: Continued Medical Work up   Patient Goals and CMS Choice Patient states their goals for this hospitalization and ongoing recovery are:: Return back to [employer].gov Compare Post Acute Care list provided to:: Patient Choice offered to / list presented to : NA  Expected Discharge Plan and Services Expected Discharge Plan: [place]   Discharge Planning Services: CM Consult Post Acute Care Choice: [place] Living arrangements for the past 2 months: [place]                                      Prior Living Arrangements/Services Living arrangements for the past 2 months: [place]     Do you feel safe going back to the place where you live?: Yes               Activities of Daily Living Home Assistive Devices/Equipment: Eyeglasses, Oxygen, Feeding equipment, Enteral Feeding Supplies, Wheelchair ADL Screening (condition at time of admission) Patient's cognitive ability adequate to safely complete daily activities?: No Is the patient deaf or have difficulty hearing?: No Does the patient have difficulty seeing, even when wearing glasses/contacts?: No Does the patient have difficulty concentrating, remembering, or making decisions?: Yes Patient able to express need for assistance with ADLs?: Yes Does the patient have difficulty dressing or  bathing?: Yes Independently performs ADLs?: No Communication: Needs assistance Is this a change from baseline?: Pre-admission baseline Dressing (OT): Needs assistance Is this a change from baseline?: Pre-admission baseline Grooming: Needs assistance Is this a change from baseline?: Pre-admission baseline Feeding: Dependent Is this a change from baseline?: Pre-admission baseline Bathing: Dependent Is this a change from baseline?: Pre-admission baseline Toileting: Dependent Is this a change from baseline?: Pre-admission baseline In/Out Bed: Dependent Is this a change from baseline?: Pre-admission baseline Walks in Home: Dependent Is this a change from baseline?: Pre-admission baseline Does the patient have difficulty walking or climbing stairs?: Yes Weakness of Legs: Both Weakness of Arms/Hands: Both  Permission Sought/Granted Permission sought to share information with : Case Manager Permission granted to share information with : Yes, Verbal Permission Granted  Share Information with NAME: [name]  Permission granted to share info w AGENCY: [place] granted to share info w Relationship: Mother  Permission granted to share info w Contact Information: [phone]  Emotional Assessment       Orientation: : Oriented to Self, Oriented to Place, Oriented to  Time, Oriented to Situation Alcohol / Substance Use: Not Applicable Psych Involvement: No (comment)  Admission diagnosis:  Quadriplegia ([place]) [G82.50] Small bowel obstruction ([place]) [K56.609] SBO (small bowel obstruction) ([place]) [K56.609] Encounter for imaging study to confirm nasogastric (NG) tube placement [Z01.89] Hematoma of rectus sheath, initial encounter [S30.1XXA] Symptomatic anemia [D64.9] Patient Active Problem List  Diagnosis Date Noted  . SBO (small bowel obstruction) ([place]) 08/10/2018  . Hematoma of rectus sheath   . Acute encephalopathy 07/20/2018  . Hypercalcemia 06/23/2018  .  Thrombocytosis ([place]) 06/22/2018  . Chronic respiratory failure with hypoxia ([place])   . Acute on chronic respiratory failure ([place])   . Quadriplegia and quadriparesis ([place])   . At high risk for severe sepsis   . Decubitus ulcer of buttock, unstageable ([place])   . Lactic acid acidosis 12/16/2017  . Suprapubic catheter ([place]) 12/16/2017  . Pressure injury of skin 07/17/2017  . Kidney stone 07/16/2017  . Right ureteral stone 03/10/2017  . Complicated UTI (urinary tract infection) 03/09/2017  . Ineffective airway clearance   . Sepsis secondary to UTI ([place]) 06/30/2016  . Autonomic neuropathy 06/10/2016  . Presence of permanent cardiac pacemaker 06/10/2016  . 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 Chronically on opiate therapy 06/10/2016  . PEG (percutaneous endoscopic gastrostomy) status ([place]) 06/10/2016  . Pulmonary hypertension ([place]) 06/10/2016  . Hypoalbuminemia 06/10/2016  . Peripheral edema   . Tracheostomy dependence ([place])   . Chronic obstructive pulmonary disease ([place])   . Chronic diastolic CHF (congestive heart failure) ([place]) 01/23/2016  . Tracheostomy status ([place])   . Esophageal dysphagia   . Malnutrition of moderate degree 11/01/2015  . Tobacco abuse 10/31/2015  . Asthma 10/31/2015  . History of pneumonia 10/08/2015  . Pressure ulcer of contiguous region involving buttock and hip, stage 4 ([place]) 02/26/2015  . Symptomatic anemia 02/25/2015  . Altered mental status 10/29/2014  . Acute metabolic encephalopathy 01/00/7121  . Leukocytosis 10/23/2014  . Hypokalemia 10/23/2014  . Decubitus ulcer of sacral region, stage 4 ([place]) 10/23/2014  . Anemia, iron deficiency 10/23/2014  . Quadriplegia ([place]) 10/22/2014  . Depression   . Protein-calorie malnutrition, severe ([place]) 09/05/2014  . 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 Recurrent UTI 03/31/2014  . Pressure ulcer of coccygeal region, stage 4 ([place]) 01/06/2014  . Stage 4 skin ulcer of sacral region ([place])  01/06/2014  . Neuropathic pain 11/30/2013  . Paraplegia following spinal cord injury ([place]) 11/30/2013  . PEG (percutaneous endoscopic gastrostomy) adjustment/replacement/removal ([place]) 11/30/2013  . Spinal cord injury, cervical region ([place]) 11/30/2013  . Chronic pain due to injury 11/30/2013  . S/P cervical spinal fusion 10/27/2013  . Tracheostomy care ([place]) 10/27/2013  . 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 SCI (spinal cord injury) 10/11/2013   PCP:  Default, Provider, MD Pharmacy:  No Pharmacies Listed    Social Determinants of Health (SDOH) Interventions    Readmission Risk Interventions Readmission Risk Prevention Plan 06/24/2018  Transportation Screening Complete  Medication Review Press photographer) Complete  HRI or [place] Complete  SW Recovery Care/Counseling Consult Complete  Palliative Care Screening Not Applicable  Skilled Nursing Facility Complete  Some recent data might be hidden

## 2018-08-11 NOTE — Consult Note (Signed)
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 Seen twice in the month of June by my partners on 06/22/18 Su Ley) and on 07/20/18 Loni Beckwith). Patient has a wound treatment associate as her bedside RN today (M. Bullins) who has just performed wound care and measurements and positioned patient. She will enter measurements into EMR. Wound type:Pressure Pressure Injury POA: Yes Measurement: Sacrum: Per flow sheet.  Large wound with three small wounds embedded within. Red, nongranulating. Small to moderate serous exudate. Left IT: per flow sheet.  Red, nongranulating. Small to moderate serous exudate. Right IT: described by Russ Halo, bedside RN and WTA as pinpoint i.e., <0.2cm with no depth. No exudate. Wound bed: As described above Drainage (amount, consistency, odor) As described above. Periwound: intact Dressing procedure/placement/frequency: I have implemented orders for continuation of wound care dressings and frequency used at home:  Silver hydrofiber (Aquacel Ag+) changed every M/W/F and PRN dressing dislodgement or soiling. Her heels are protected with silicone foam dressings and are floated.  As we just provided Prevalon Boots for her during the last admission three weeks ago, I will not provide again today.  Bedside RN (WTA Megan Bullins) believes we can provide pressure redistribution and off loading of the heels via flotation. Patient is on a mattress replacement with low air loss feature. No other needs identified at this time.   The assistance and expertise of Ms. Clinton Gallant is appreciated.  [place] nursing team will not follow, but will remain available to this patient, the nursing and medical teams.  Please re-consult if needed. Thanks, Maudie Flakes, MSN, RN, [place], Arther Abbott  Pager# [phone]

## 2018-08-12 ENCOUNTER — Inpatient Hospital Stay (HOSPITAL_COMMUNITY): Payer: Medicare Other

## 2018-08-12 LAB — TYPE AND SCREEN
ABO/RH(D): A POS
Antibody Screen: NEGATIVE
Unit division: 0
Unit division: 0

## 2018-08-12 LAB — COMPREHENSIVE METABOLIC PANEL
ALT: 14 U/L (ref 0–44)
AST: 12 U/L — ABNORMAL LOW (ref 15–41)
Albumin: 3.2 g/dL — ABNORMAL LOW (ref 3.5–5.0)
Alkaline Phosphatase: 131 U/L — ABNORMAL HIGH (ref 38–126)
Anion gap: 20 — ABNORMAL HIGH (ref 5–15)
BUN: 18 mg/dL (ref 6–20)
CO2: 23 mmol/L (ref 22–32)
Calcium: 9.5 mg/dL (ref 8.9–10.3)
Chloride: 99 mmol/L (ref 98–111)
Creatinine, Ser: 0.69 mg/dL (ref 0.44–1.00)
GFR calc Af Amer: >60 mL/min (ref >60)
GFR calc non Af Amer: >60 mL/min (ref >60)
Glucose, Bld: 119 mg/dL — ABNORMAL HIGH (ref 70–99)
Potassium: 3.7 mmol/L (ref 3.5–5.1)
Sodium: 142 mmol/L (ref 135–145)
Total Bilirubin: 1.2 mg/dL (ref 0.3–1.2)
Total Protein: 7.6 g/dL (ref 6.5–8.1)

## 2018-08-12 LAB — URINE CULTURE

## 2018-08-12 LAB — GLUCOSE, CAPILLARY
Glucose-Capillary: 105 mg/dL — ABNORMAL HIGH (ref 70–99)
Glucose-Capillary: 123 mg/dL — ABNORMAL HIGH (ref 70–99)
Glucose-Capillary: 95 mg/dL (ref 70–99)
Glucose-Capillary: 89 mg/dL (ref 70–99)
Glucose-Capillary: 100 mg/dL — ABNORMAL HIGH (ref 70–99)

## 2018-08-12 LAB — BPAM RBC
Blood Product Expiration Date: 202008142359
Blood Product Expiration Date: 202008142359
ISSUE DATE / TIME: 202007211944
ISSUE DATE / TIME: 202007220050
Unit Type and Rh: 6200
Unit Type and Rh: 6200

## 2018-08-12 LAB — CBC
HCT: 32 % — ABNORMAL LOW (ref 36.0–46.0)
Hemoglobin: 9.7 g/dL — ABNORMAL LOW (ref 12.0–15.0)
MCH: 28.1 pg (ref 26.0–34.0)
MCHC: 30.3 g/dL (ref 30.0–36.0)
MCV: 92.8 fL (ref 80.0–100.0)
Platelets: 441 10*3/uL — ABNORMAL HIGH (ref 150–400)
RBC: 3.45 MIL/uL — ABNORMAL LOW (ref 3.87–5.11)
RDW: 16.1 % — ABNORMAL HIGH (ref 11.5–15.5)
WBC: 10.3 10*3/uL (ref 4.0–10.5)
nRBC: 0 % (ref 0.0–0.2)

## 2018-08-12 LAB — LACTIC ACID, PLASMA: Lactic Acid, Venous: 0.8 mmol/L (ref 0.5–1.9)

## 2018-08-12 MED ORDER — ACETAMINOPHEN 325 MG PO TABS: 650.0000 mg | ORAL_TABLET | ORAL | Status: DC | PRN

## 2018-08-12 MED ORDER — POTASSIUM CHLORIDE IN NACL 20-0.9 MEQ/L-% IV SOLN
INTRAVENOUS | Status: DC
  Administered 2018-08-12 – 2018-08-15 (×6): via INTRAVENOUS
  Filled 2018-08-12 (×6): qty 1000

## 2018-08-12 MED ORDER — PROMETHAZINE HCL 25 MG/ML IJ SOLN
12.5000 mg | Freq: Four times a day (QID) | INTRAMUSCULAR | Status: DC | PRN
  Administered 2018-08-12 – 2018-08-14 (×4): 12.5 mg via INTRAVENOUS
  Filled 2018-08-12 (×4): qty 1

## 2018-08-12 MED ORDER — LACTATED RINGERS IV BOLUS
500.0000 mL | Freq: Once | INTRAVENOUS | Status: AC
  Administered 2018-08-12: 03:00:00 500 mL via INTRAVENOUS

## 2018-08-12 MED ORDER — LORAZEPAM 2 MG/ML IJ SOLN
0.5000 mg | Freq: Once | INTRAMUSCULAR | Status: AC | PRN
  Administered 2018-08-12: 0.5 mg via INTRAVENOUS
  Filled 2018-08-12: qty 1

## 2018-08-12 MED ORDER — HYDROMORPHONE HCL 1 MG/ML IJ SOLN
0.5000 mg | Freq: Once | INTRAMUSCULAR | Status: AC
  Administered 2018-08-12: 0.5 mg via INTRAVENOUS
  Filled 2018-08-12: qty 0.5

## 2018-08-12 NOTE — Progress Notes (Signed)
Patient's mother called and updated.

## 2018-08-12 NOTE — Progress Notes (Signed)
[facility] Progress Note     Subjective: CC-  Patient states that she is feeling worse today. She is more nauseated. Emesis x1 yesterday. She did have 1 small and 1 large BM yesterday. Delayed film showed contrast in the stomach. She has about 1L in gravity bag from G tube.  Objective: Vital signs in last 24 hours: Temp:  [98.8 F (37.1 C)-100.8 F (38.2 C)] 98.8 F (37.1 C) (07/23 0525) Pulse Rate:  [80-107] 97 (07/23 0737) Resp:  [16-20] 18 (07/23 0737) BP: (139-169)/(91-99) 139/92 (07/23 0525) SpO2:  [97 %-100 %] 97 % (07/23 0737) FiO2 (%):  [28 %] 28 % (07/23 0737) Weight:  [25.4 kg] 25.4 kg (07/23 0500) Last BM Date: 08/11/18  Intake/Output from previous day: 07/22 0701 - 07/23 0700 In: 545.5 [IV Piggyback:545.5] Out: 550 [Urine:550] Intake/Output this shift: No intake/output data recorded.  PE: Gen:  Alert, NAD, pleasant HEENT: EOM's intact, pupils equal and round. Trach in place Pulm:  Rate and effort normal Abd: somewhat firm, distended, nontender, G-tube LUQ to gravity Ext:  Contractures BUE/BLE, quadriplegic Skin: warm and dry   Lab Results:  Recent Labs    08/10/18 1440 08/11/18 0038  WBC 13.3* 12.4*  HGB 7.1* 8.1*  HCT 23.7* 27.5*  PLT 470* 457*   BMET Recent Labs    08/10/18 1440 08/11/18 0038  NA 135 136  K 4.2 3.8  CL 92* 99  CO2 28 24  GLUCOSE 178* 145*  BUN 17 14  CREATININE 0.54 0.55  CALCIUM 9.7 8.9   PT/INR No results for input(s): LABPROT, INR in the last 72 hours. CMP     Component Value Date/Time   NA 136 08/11/2018 0038   NA 136 (A) 09/23/2016   K 3.8 08/11/2018 0038   CL 99 08/11/2018 0038   CL 85 09/23/2016   CO2 24 08/11/2018 0038   GLUCOSE 145 (H) 08/11/2018 0038   BUN 14 08/11/2018 0038   BUN 35 (A) 09/23/2016   CREATININE 0.55 08/11/2018 0038   CALCIUM 8.9 08/11/2018 0038   CALCIUM 10.4 09/23/2016   PROT 7.8 08/11/2018 0038   PROT 8.1 09/23/2016   ALBUMIN 3.0 (L) 08/11/2018 0038   ALBUMIN  4.3 09/23/2016   AST 13 (L) 08/11/2018 0038   ALT 13 08/11/2018 0038   ALKPHOS 133 (H) 08/11/2018 0038   ALKPHOS 195 09/23/2016   BILITOT 0.4 08/11/2018 0038   BILITOT 0.2 09/23/2016   GFRNONAA >60 08/11/2018 0038   GFRAA >60 08/11/2018 0038   Lipase     Component Value Date/Time   LIPASE 19 08/10/2018 1440       Studies/Results: Ct Abdomen Pelvis W Contrast  Result Date: 08/10/2018 CLINICAL DATA:  Abdominal pain since this morning. EXAM: CT ABDOMEN AND PELVIS WITH CONTRAST TECHNIQUE: Multidetector CT imaging of the abdomen and pelvis was performed using the standard protocol following bolus administration of intravenous contrast. CONTRAST:  70mL OMNIPAQUE IOHEXOL 300 MG/ML  SOLN COMPARISON:  12/16/2017 FINDINGS: Lower chest: Bibasilar atelectasis and scarring changes. No definite infiltrates or effusions. The heart is normal in size. No pericardial effusion. Pacer wires are noted. Hepatobiliary: No focal hepatic lesions or intrahepatic biliary dilatation. The gallbladder demonstrates small gallstones but no findings for acute cholecystitis. No common bile duct dilatation. Pancreas: No mass, inflammation or ductal dilatation. Spleen: Normal size.  No focal lesions. Adrenals/Urinary Tract: The adrenal glands and kidneys are unremarkable. There is a lower pole left renal calculus noted but no obstructing ureteral calculi or bladder calculi.  No obvious bladder lesions. Patient has a suprapubic urinary catheter in place. Stomach/Bowel: The stomach contains a feeding gastrostomy tube. No complicating features are identified. There is mild distention of the stomach with fluid. The duodenum is unremarkable. The proximal and mid small bowel loops are dilated and demonstrate air-fluid levels consistent with obstruction. There appears to be a transition to nondilated/decompressed loops of small bowel in the upper pelvis in the midline (series 2, image 53 and series 5 image 49, 50 and 51). This is likely  due to adhesions from prior surgery. Moderate stool in the colon and down into the rectum. No colonic mass or obstruction. The terminal ileum appears normal. Vascular/Lymphatic: Advanced atherosclerotic calcifications involving the aorta iliac arteries but no aneurysm. The branch vessels are patent. The major venous structures are patent. Small scattered mesenteric and retroperitoneal lymph nodes but no mass or overt adenopathy. Reproductive: The uterus and ovaries are unremarkable. Other: There is a large left rectus sheath hematoma measuring approximately 17.5 x 13.0 x 7.5 cm. Musculoskeletal: No significant bony findings. Advanced osteoporosis for age. A sacral decubitus ulcer is noted and extends down very close to the bone. I do not see any obvious destructive bony changes or ever it appears that the lower sacrum and coccyx have been previously resected. IMPRESSION: 1. CT findings consistent with a fairly high-grade small-bowel obstruction in the mid small bowel region in the central abdomen at the level of the iliac crest. This is most likely due to adhesions. 2. Very large left rectus sheath hematoma. 3. Feeding gastrostomy tube and suprapubic catheter is in good position without complicating features. 4. Sacral decubitus ulcer. 5. Marked age advanced vascular calcifications for age. 6. Lower pole left renal calculus but no obstructing ureteral calculi. Electronically Signed   By: Marijo Sanes M.D.   On: 08/10/2018 16:28   Dg Chest Portable 1 View  Result Date: 08/10/2018 CLINICAL DATA:  Abdominal pain since 0300 hours this morning, nausea, single episode of vomiting, abdominal distension today, quadriplegia EXAM: PORTABLE CHEST 1 VIEW COMPARISON:  Portable exam 1403 hours compared to 07/20/2018 FINDINGS: LEFT subclavian pacemaker with leads projecting over RIGHT atrium and RIGHT ventricle. Tracheostomy tube projects over tracheal air column. Normal heart size, mediastinal contours, and pulmonary  vascularity. RIGHT basilar scarring. Chronic accentuation of retrocardiac LEFT lower lobe markings unchanged since 06/22/2018 Remaining lungs clear. No pleural effusion or pneumothorax. No acute osseous findings. IMPRESSION: Chronic bibasilar changes without definite acute infiltrate. Electronically Signed   By: Lavonia Dana M.D.   On: 08/10/2018 14:52   Dg Abd Portable 1v-small Bowel Obstruction Protocol-initial, 8 Hr Delay  Result Date: 08/11/2018 CLINICAL DATA:  Small bowel obstruction. EXAM: PORTABLE ABDOMEN - 1 VIEW COMPARISON:  CT scan dated 08/10/2018 FINDINGS: There are multiple persistent dilated loops of small bowel. Contrast is also seen in the fundus of the stomach. Gastrostomy tube in place. Colon does not appear distended. IMPRESSION: Persistent small bowel obstruction, slightly progressed. Electronically Signed   By: Lorriane Shire M.D.   On: 08/11/2018 18:11    Anti-infectives: Anti-infectives (From admission, onward)   Start     Dose/Rate Route Frequency Ordered Stop   08/11/18 0200  cefTRIAXone (ROCEPHIN) 1 g in sodium chloride 0.9 % 100 mL IVPB     1 g 200 mL/hr over 30 Minutes Intravenous Every 24 hours 08/10/18 1921     08/10/18 1800  aztreonam (AZACTAM) 2 g in sodium chloride 0.9 % 100 mL IVPB     2 g 200 mL/hr  over 30 Minutes Intravenous  Once 08/10/18 1756 08/10/18 2011       Assessment/Plan Quadriplegia s/p trach and PEG and SP catheter DM Depression Chronic hypotension COPD Sacral decubitus ulcer Anemia - Hgb 8.1 (7/22), monitor UTI - on rocephin  SBO vs ileus from rectus sheath hematoma - CT scan 7/21 shows SBO, large left rectus sheath hematoma 17.5 x 13.0 x 7.5 cm  - Small bowel protocol started 7/22 and delayed film showed persistent SBO with contrast in the stomach  ID - rocephin 7/22>> FEN - IVF, NPO, G-tube to gravity VTE - SCDs, lovenox on hold due to rectus sheath hematoma  Foley - chronic SP catheter Follow up - TBD   Plan: Repeat abdominal  film today. Continue NPO and G tube to gravity. Add phenergan PRN for refractory nausea/vomiting.    LOS: 2 days    Wellington [place] , [facility] 08/12/2018, 8:54 AM Pager: [phone] Mon-Thurs 7:00 am-4:30 pm Fri 7:00 am -11:30 AM Sat-Sun 7:00 am-11:30 am

## 2018-08-12 NOTE — Progress Notes (Signed)
PROGRESS NOTE    Angela Page  GNO:[id number] DOB: 03-23-1970 DOA: 08/10/2018 PCP: Default, Provider, MD   Brief Narrative: 48 y.o. female with medical history significant of traumatic C spine fracture resulting in quadriplegia s/p trach, peg, suprapubic catheter as well as COPD, chronic hypotension, T2DM, chronic pain, SSS s/p pacemaker, and chronic decubitus ulcers presenting with abdominal pain.  She notes he sx started last night after tube feeds were starting.  She developed worsening abdominal pain, nausea, and distension.  She presented to the ED due to worsening abdominal sx. No vomiting until in ED.  No F, chills, CP, SOB.  Continues to have BM.  No known COVID exposures.   ED Course: Imaging concerning for SBO and rectus sheath hematoma.  Labs with anemia.  Surgery c/s.  Labs, imaging.  Abx for possible UTI.  Hospitalist to admit for SBO, rectus sheath hematoma, and possible UTI.   Subjective: Patient feeling worse today with nausea and abdomen  Pain. Has large output from the G-tube, also had emesis x1 yesterday, had 1 small and 1 large bowel movement yesterday. Labs pending this morning. tmax 100.8  Assessment & Plan:  SBO versus ileus from rectus sheath  Hematoma: Patient persistent small bowel obstruction, Gastrografin foot x-ray in the stoma.  Noted surgery plan continue n.p.o,continue G-tube to gravity, and monitor closely.  Continue IV fluid hydration, IV Phenergan and opiates for pain control.  Rectus sheath hematoma, Lovenox has been discontinued.  CT scan showed hematoma 17.5 x 13.0 x 7.5 cm.  Hemoglobin at 8.1 g.  Monitor hemoglobin and transfuse as needed.  Doubt she is a candidate for anticoagulation on long-term.  Acute blood blood loss anemia secondary to hematoma.  Baseline hemoglobin around 9.8 g.  Status post 2 unit PRBC transfusion. up at 9.7.Monitor. Recent Labs  Lab 08/10/18 1440 08/11/18 0038 08/12/18 1038  HGB 7.1* 8.1* 9.7*  HCT 23.7* 27.5*  32.0*   Quadriplegia with trach and PEG and SP catheter in place.  Continue supportive care, trach collar, supplemental oxygen parameters.once able to take meds resume baclofen 5 mg tid, gabapentin 300 mg tid. Cont nebs, incurse ellipta and pulm toiletting.  UTI POA on Rocephin.T-max 100.8,blood culture no growth so far,Urine culture multiple species present. Cont empiric antibiotics.  Bed sore in buttock and sacrum.  Continue wound care.  Continue offloading,  T2DM: Blood sugar well controlled, continue sliding scale insulin every 4 hours while n.p.o., hold oral meds.   Recent Labs  Lab 08/11/18 2236 08/11/18 2351 08/12/18 0357 08/12/18 0805 08/12/18 1208  GLUCAP 114* 102* 105* 123* 95   Depression/Insomnia:Mood is stable, when able to take p.o. resume cymbalta, wellbutrin  Chronic Hypotension:patient reports midodrine was d/c'd, continue to monitor off midodrine.  Pressure fairly stable.  Hx COPD:continue home meds  Elevated istat hcg: repeat serum HCG was 4 (ref <5).  DVT prophylaxis: SCD Code Status: Full code Family Communication: plan of care discussed with patient in detail. I called and updated pt's mother Disposition Plan: Remains inpatient pending clinical improvement. Will need SNF  Consultants:  General surgery  Procedures: None  7/23:Abd portable  Stable small bowel dilatation is noted concerning for distal small bowel obstruction.  CT abdomen/pelvis:08/10/2018  1.CT findings consistent with a fairly high-grade small-bowel obstruction in the mid small bowel region in the central abdomen at the level of the iliac crest. This is most likely due to adhesions. 2.Very large left rectus sheath hematoma. 3.Feeding gastrostomy tube and suprapubic catheter is in good position without  complicating features. 4.Sacral decubitus ulcer. 5.Marked age advanced vascular calcifications for age. 6.Lower pole left renal calculus but no obstructing  ureteral Calculi.  Microbiology: Urine culture pending Antimicrobials: Anti-infectives (From admission, onward)   Start     Dose/Rate Route Frequency Ordered Stop   08/11/18 0200  cefTRIAXone (ROCEPHIN) 1 g in sodium chloride 0.9 % 100 mL IVPB     1 g 200 mL/hr over 30 Minutes Intravenous Every 24 hours 08/10/18 1921     08/10/18 1800  aztreonam (AZACTAM) 2 g in sodium chloride 0.9 % 100 mL IVPB     2 g 200 mL/hr over 30 Minutes Intravenous  Once 08/10/18 1756 08/10/18 2011       Objective: Vitals:   08/12/18 0500 08/12/18 0525 08/12/18 0737 08/12/18 1134  BP:  (!) 139/92    Pulse:  97 97 91  Resp:  16 18 17   Temp:  98.8 F (37.1 C)    TempSrc:  Oral    SpO2:  97% 97% 98%  Weight: 25.4 kg     Height:        Intake/Output Summary (Last 24 hours) at 08/12/2018 1311 Last data filed at 08/12/2018 0400 Gross per 24 hour  Intake 545.5 ml  Output 550 ml  Net -4.5 ml   Filed Weights   08/11/18 0024 08/12/18 0500  Weight: 47.5 kg 25.4 kg   Weight change: -22.1 kg  Body mass index is 10.24 kg/m.  Intake/Output from previous day: 07/22 0701 - 07/23 0700 In: 545.5 [IV Piggyback:545.5] Out: 550 [Urine:550] Intake/Output this shift: No intake/output data recorded.  Examination:  General exam: Appears to be in pain, anxious, mild distress, alert awake  HEENT:PERRL,Oral mucosa moist, Ear/Nose normal on gross exam, trahc+ w trach collar Respiratory system: Bilateral equal air entry, normal vesicular breath sounds, no wheezes or crackles  Cardiovascular system: S1 & S2 heard,No JVD, murmurs. Gastrointestinal system: Abdomen is  firm, moderately distended, G-tube present to gravity, SP tube present.  Nervous System:Alert and oriented. LE weakness, contractures+ Extremities: No edema, no clubbing, distal peripheral pulses palpable. Skin: No rashes, lesions, no icterus MSK: Normal muscle bulk,tone ,power Bed sore in buttock and sacrum.   Medications:  Scheduled Meds:   sodium chloride   Intravenous Once   buPROPion  150 mg Oral Daily   Chlorhexidine Gluconate Cloth  6 each Topical Q0600   DULoxetine  30 mg Oral BID   gabapentin  300 mg Oral TID   insulin aspart  0-9 Units Subcutaneous Q4H   mupirocin ointment  1 application Nasal BID   umeclidinium bromide  1 puff Inhalation Daily   zolpidem  2.5 mg Oral QHS   Continuous Infusions:  0.9 % NaCl with KCl 20 mEq / L 100 mL/hr at 08/12/18 1211   cefTRIAXone (ROCEPHIN)  IV 1 g (08/12/18 0155)    Data Reviewed: I have personally reviewed following labs and imaging studies  CBC: Recent Labs  Lab 08/10/18 1440 08/11/18 0038 08/12/18 1038  WBC 13.3* 12.4* 10.3  NEUTROABS 11.4*  --   --   HGB 7.1* 8.1* 9.7*  HCT 23.7* 27.5* 32.0*  MCV 91.5 92.0 92.8  PLT 470* 457* 295*   Basic Metabolic Panel: Recent Labs  Lab 08/10/18 1440 08/11/18 0038 08/12/18 1038  NA 135 136 142  K 4.2 3.8 3.7  CL 92* 99 99  CO2 28 24 23   GLUCOSE 178* 145* 119*  BUN 17 14 18   CREATININE 0.54 0.55 0.69  CALCIUM 9.7 8.9 9.5  GFR: Estimated Creatinine Clearance: 34.9 mL/min (by C-G formula based on SCr of 0.69 mg/dL). Liver Function Tests: Recent Labs  Lab 08/10/18 1440 08/11/18 0038 08/12/18 1038  AST 18 13* 12*  ALT 16 13 14   ALKPHOS 157* 133* 131*  BILITOT 0.7 0.4 1.2  PROT 8.7* 7.8 7.6  ALBUMIN 3.4* 3.0* 3.2*   Recent Labs  Lab 08/10/18 1440  LIPASE 19   No results for input(s): AMMONIA in the last 168 hours. Coagulation Profile: No results for input(s): INR, PROTIME in the last 168 hours. Cardiac Enzymes: No results for input(s): CKTOTAL, CKMB, CKMBINDEX, TROPONINI in the last 168 hours. BNP (last 3 results) No results for input(s): PROBNP in the last 8760 hours. HbA1C: No results for input(s): HGBA1C in the last 72 hours. CBG: Recent Labs  Lab 08/11/18 2236 08/11/18 2351 08/12/18 0357 08/12/18 0805 08/12/18 1208  GLUCAP 114* 102* 105* 123* 95   Lipid Profile: No results  for input(s): CHOL, HDL, LDLCALC, TRIG, CHOLHDL, LDLDIRECT in the last 72 hours. Thyroid Function Tests: No results for input(s): TSH, T4TOTAL, FREET4, T3FREE, THYROIDAB in the last 72 hours. Anemia Panel: Recent Labs    08/10/18 1500  VITAMINB12 493  FOLATE 24.0  FERRITIN 84  TIBC 313  IRON 29   Sepsis Labs: Recent Labs  Lab 08/10/18 1448 08/11/18 0038  LATICACIDVEN 1.1 0.9    Recent Results (from the past 240 hour(s))  Urine culture     Status: Abnormal   Collection Time: 08/10/18  2:06 PM   Specimen: Urine, Catheterized  Result Value Ref Range Status   Specimen Description   Final    Urine Performed at [facility] [address] [place], [place] [address]    Special Requests   Final    NONE Performed at [facility], [place] [address], [place], [place] [address]    Culture MULTIPLE SPECIES PRESENT, SUGGEST RECOLLECTION (A)  Final   Report Status 08/12/2018 FINAL  Final  Culture, blood (routine x 2)     Status: None (Preliminary result)   Collection Time: 08/10/18  2:42 PM   Specimen: BLOOD  Result Value Ref Range Status   Specimen Description   Final    BLOOD RIGHT ARM Performed at [facility] [address], [place], [place] [address]    Special Requests   Final    BOTTLES DRAWN AEROBIC AND ANAEROBIC Blood Culture adequate volume Performed at [facility] [address] [place], [place] [address]    Culture   Final    NO GROWTH 2 DAYS Performed at [facility] [address], [place], [place] [address]    Report Status PENDING  Incomplete  Culture, blood (routine x 2)     Status: None (Preliminary result)   Collection Time: 08/10/18  3:29 PM   Specimen: BLOOD LEFT HAND  Result Value Ref Range Status   Specimen Description   Final    BLOOD LEFT HAND Performed at [facility] [address], [place], [place] [address]    Special Requests   Final     BOTTLES DRAWN AEROBIC AND ANAEROBIC Blood Culture adequate volume Performed at [facility] [address], [place], [place] [address]    Culture   Final    NO GROWTH 2 DAYS Performed at [facility] [address], [place], [place] [address]    Report Status PENDING  Incomplete  SARS Coronavirus 2 (CEPHEID -  Performed in [facility] hospital lab), Hosp Order     Status: None   Collection Time: 08/10/18  6:20 PM   Specimen: Nasopharyngeal Swab  Result Value Ref Range Status   SARS Coronavirus 2 NEGATIVE NEGATIVE Final    Comment: (NOTE) If result is NEGATIVE SARS-CoV-2 target nucleic acids are NOT DETECTED. The SARS-CoV-2 RNA is generally detectable in upper and lower  respiratory specimens during the acute phase of infection. The lowest  concentration of SARS-CoV-2 viral copies this assay can detect is 250  copies / mL. A negative result does not preclude SARS-CoV-2 infection  and should not be used as the sole basis for treatment or other  patient management decisions.  A negative result may occur with  improper specimen collection / handling, submission of specimen other  than nasopharyngeal swab, presence of viral mutation(s) within the  areas targeted by this assay, and inadequate number of viral copies  (<250 copies / mL). A negative result must be combined with clinical  observations, patient history, and epidemiological information. If result is POSITIVE SARS-CoV-2 target nucleic acids are DETECTED. The SARS-CoV-2 RNA is generally detectable in upper and lower  respiratory specimens dur ing the acute phase of infection.  Positive  results are indicative of active infection with SARS-CoV-2.  Clinical  correlation with patient history and other diagnostic information is  necessary to determine patient infection status.  Positive results do  not rule out bacterial infection or co-infection with other viruses. If result is PRESUMPTIVE  POSTIVE SARS-CoV-2 nucleic acids MAY BE PRESENT.   A presumptive positive result was obtained on the submitted specimen  and confirmed on repeat testing.  While 2019 novel coronavirus  (SARS-CoV-2) nucleic acids may be present in the submitted sample  additional confirmatory testing may be necessary for epidemiological  and / or clinical management purposes  to differentiate between  SARS-CoV-2 and other Sarbecovirus currently known to infect humans.  If clinically indicated additional testing with an alternate test  methodology [id number]) is advised. The SARS-CoV-2 RNA is generally  detectable in upper and lower respiratory sp ecimens during the acute  phase of infection. The expected result is Negative. Fact Sheet for Patients:  StrictlyIdeas.no Fact Sheet for Healthcare Providers: BankingDealers.co.za This test is not yet approved or cleared by the [place] FDA and has been authorized for detection and/or diagnosis of SARS-CoV-2 by FDA under an Emergency Use Authorization (EUA).  This EUA will remain in effect (meaning this test can be used) for the duration of the COVID-19 declaration under Section 564(b)(1) of the Act, 21 U.S.C. section 360bbb-3(b)(1), unless the authorization is terminated or revoked sooner. Performed at [facility], [place] [address], [place], [place] [address]   MRSA PCR Screening     Status: Abnormal   Collection Time: 08/11/18  1:42 AM   Specimen: Nasal Mucosa; Nasopharyngeal  Result Value Ref Range Status   MRSA by PCR POSITIVE (A) NEGATIVE Final    Comment:        The GeneXpert MRSA Assay (FDA approved for NASAL specimens only), is one component of a comprehensive MRSA colonization surveillance program. It is not intended to diagnose MRSA infection nor to guide or monitor treatment for MRSA infections. RESULT CALLED TO, READ BACK BY AND VERIFIED WITH: ELLIS,C @ 0425 ON [id number] BY  POTEAT,S Performed at [facility] [address] [place],  [address]       Radiology Studies: Ct Abdomen Pelvis W Contrast  Result Date: 08/10/2018  CLINICAL DATA:  Abdominal pain since this morning. EXAM: CT ABDOMEN AND PELVIS WITH CONTRAST TECHNIQUE: Multidetector CT imaging of the abdomen and pelvis was performed using the standard protocol following bolus administration of intravenous contrast. CONTRAST:  62mL OMNIPAQUE IOHEXOL 300 MG/ML  SOLN COMPARISON:  12/16/2017 FINDINGS: Lower chest: Bibasilar atelectasis and scarring changes. No definite infiltrates or effusions. The heart is normal in size. No pericardial effusion. Pacer wires are noted. Hepatobiliary: No focal hepatic lesions or intrahepatic biliary dilatation. The gallbladder demonstrates small gallstones but no findings for acute cholecystitis. No common bile duct dilatation. Pancreas: No mass, inflammation or ductal dilatation. Spleen: Normal size.  No focal lesions. Adrenals/Urinary Tract: The adrenal glands and kidneys are unremarkable. There is a lower pole left renal calculus noted but no obstructing ureteral calculi or bladder calculi. No obvious bladder lesions. Patient has a suprapubic urinary catheter in place. Stomach/Bowel: The stomach contains a feeding gastrostomy tube. No complicating features are identified. There is mild distention of the stomach with fluid. The duodenum is unremarkable. The proximal and mid small bowel loops are dilated and demonstrate air-fluid levels consistent with obstruction. There appears to be a transition to nondilated/decompressed loops of small bowel in the upper pelvis in the midline (series 2, image 53 and series 5 image 49, 50 and 51). This is likely due to adhesions from prior surgery. Moderate stool in the colon and down into the rectum. No colonic mass or obstruction. The terminal ileum appears normal. Vascular/Lymphatic: Advanced atherosclerotic calcifications  involving the aorta iliac arteries but no aneurysm. The branch vessels are patent. The major venous structures are patent. Small scattered mesenteric and retroperitoneal lymph nodes but no mass or overt adenopathy. Reproductive: The uterus and ovaries are unremarkable. Other: There is a large left rectus sheath hematoma measuring approximately 17.5 x 13.0 x 7.5 cm. Musculoskeletal: No significant bony findings. Advanced osteoporosis for age. A sacral decubitus ulcer is noted and extends down very close to the bone. I do not see any obvious destructive bony changes or ever it appears that the lower sacrum and coccyx have been previously resected. IMPRESSION: 1. CT findings consistent with a fairly high-grade small-bowel obstruction in the mid small bowel region in the central abdomen at the level of the iliac crest. This is most likely due to adhesions. 2. Very large left rectus sheath hematoma. 3. Feeding gastrostomy tube and suprapubic catheter is in good position without complicating features. 4. Sacral decubitus ulcer. 5. Marked age advanced vascular calcifications for age. 6. Lower pole left renal calculus but no obstructing ureteral calculi. Electronically Signed   By: Marijo Sanes M.D.   On: 08/10/2018 16:28   Dg Chest Portable 1 View  Result Date: 08/10/2018 CLINICAL DATA:  Abdominal pain since 0300 hours this morning, nausea, single episode of vomiting, abdominal distension today, quadriplegia EXAM: PORTABLE CHEST 1 VIEW COMPARISON:  Portable exam 1403 hours compared to 07/20/2018 FINDINGS: LEFT subclavian pacemaker with leads projecting over RIGHT atrium and RIGHT ventricle. Tracheostomy tube projects over tracheal air column. Normal heart size, mediastinal contours, and pulmonary vascularity. RIGHT basilar scarring. Chronic accentuation of retrocardiac LEFT lower lobe markings unchanged since 06/22/2018 Remaining lungs clear. No pleural effusion or pneumothorax. No acute osseous findings. IMPRESSION:  Chronic bibasilar changes without definite acute infiltrate. Electronically Signed   By: Lavonia Dana M.D.   On: 08/10/2018 14:52   Dg Abd Portable 1v  Result Date: 08/12/2018 CLINICAL DATA:  Abdominal pain, abdominal distention. EXAM: PORTABLE ABDOMEN - 1 VIEW COMPARISON:  Radiograph  of August 11, 2018. FINDINGS: Stable small bowel dilatation is noted concerning for distal small bowel obstruction. No colonic dilatation is noted. Residual contrast remains within the gastric fundus. Gastrostomy tube is again noted and unchanged. IMPRESSION: Stable small bowel dilatation is noted concerning for distal small bowel obstruction. Electronically Signed   By: Marijo Conception M.D.   On: 08/12/2018 12:01   Dg Abd Portable 1v-small Bowel Obstruction Protocol-initial, 8 Hr Delay  Result Date: 08/11/2018 CLINICAL DATA:  Small bowel obstruction. EXAM: PORTABLE ABDOMEN - 1 VIEW COMPARISON:  CT scan dated 08/10/2018 FINDINGS: There are multiple persistent dilated loops of small bowel. Contrast is also seen in the fundus of the stomach. Gastrostomy tube in place. Colon does not appear distended. IMPRESSION: Persistent small bowel obstruction, slightly progressed. Electronically Signed   By: Lorriane Shire M.D.   On: 08/11/2018 18:11      LOS: 2 days   Time spent: More than 50% of that time was spent in counseling and/or coordination of care.  Antonieta Pert, MD [facility]  08/12/2018, 1:11 PM

## 2018-08-12 NOTE — Progress Notes (Signed)
1 L of green contents emptied from bag draining from PEG

## 2018-08-13 ENCOUNTER — Inpatient Hospital Stay (HOSPITAL_COMMUNITY): Payer: Medicare Other

## 2018-08-13 LAB — GLUCOSE, CAPILLARY
Glucose-Capillary: 103 mg/dL — ABNORMAL HIGH (ref 70–99)
Glucose-Capillary: 105 mg/dL — ABNORMAL HIGH (ref 70–99)
Glucose-Capillary: 118 mg/dL — ABNORMAL HIGH (ref 70–99)
Glucose-Capillary: 92 mg/dL (ref 70–99)
Glucose-Capillary: 94 mg/dL (ref 70–99)
Glucose-Capillary: 95 mg/dL (ref 70–99)

## 2018-08-13 LAB — BASIC METABOLIC PANEL
Anion gap: 21 — ABNORMAL HIGH (ref 5–15)
BUN: 17 mg/dL (ref 6–20)
CO2: 18 mmol/L — ABNORMAL LOW (ref 22–32)
Calcium: 9 mg/dL (ref 8.9–10.3)
Chloride: 104 mmol/L (ref 98–111)
Creatinine, Ser: 0.61 mg/dL (ref 0.44–1.00)
GFR calc Af Amer: >60 mL/min (ref >60)
GFR calc non Af Amer: >60 mL/min (ref >60)
Glucose, Bld: 105 mg/dL — ABNORMAL HIGH (ref 70–99)
Potassium: 3.7 mmol/L (ref 3.5–5.1)
Sodium: 143 mmol/L (ref 135–145)

## 2018-08-13 LAB — CBC
HCT: 33.3 % — ABNORMAL LOW (ref 36.0–46.0)
Hemoglobin: 9.5 g/dL — ABNORMAL LOW (ref 12.0–15.0)
MCH: 27.7 pg (ref 26.0–34.0)
MCHC: 28.5 g/dL — ABNORMAL LOW (ref 30.0–36.0)
MCV: 97.1 fL (ref 80.0–100.0)
Platelets: 452 10*3/uL — ABNORMAL HIGH (ref 150–400)
RBC: 3.43 MIL/uL — ABNORMAL LOW (ref 3.87–5.11)
RDW: 16.4 % — ABNORMAL HIGH (ref 11.5–15.5)
WBC: 8.5 10*3/uL (ref 4.0–10.5)
nRBC: 0 % (ref 0.0–0.2)

## 2018-08-13 MED ORDER — HYDROMORPHONE HCL 1 MG/ML IJ SOLN
0.5000 mg | INTRAMUSCULAR | Status: DC | PRN
  Administered 2018-08-13 – 2018-08-24 (×50): 0.5 mg via INTRAVENOUS
  Filled 2018-08-13 (×51): qty 0.5

## 2018-08-13 NOTE — Care Management Important Message (Signed)
Important Message  Patient Details IM Letter given to Dessa Phi RN to present to the Patient Name: Angela Page MRN: [id number] Date of Birth: 12-Apr-1970   Medicare Important Message Given:  Yes     Kerin Salen 08/13/2018, 10:20 AM

## 2018-08-13 NOTE — Progress Notes (Signed)
PROGRESS NOTE    Angela Page  FBP:[id number] DOB: October 20, 1970 DOA: 08/10/2018 PCP: Default, Provider, MD   Brief Narrative: 48 y.o. female with medical history significant of traumatic C spine fracture resulting in quadriplegia s/p trach, peg, suprapubic catheter as well as COPD, chronic hypotension, T2DM, chronic pain, SSS s/p pacemaker, and chronic decubitus ulcers presenting with abdominal pain.  She notes he sx started last night after tube feeds were starting.  She developed worsening abdominal pain, nausea, and distension.  She presented to the ED due to worsening abdominal sx. No vomiting until in ED.  No F, chills, CP, SOB.  Continues to have BM.  No known COVID exposures.   ED Course: Imaging concerning for SBO and rectus sheath hematoma.  Labs with anemia.  Surgery c/s.  Labs, imaging.  Abx for possible UTI.  Hospitalist to admit for SBO, rectus sheath hematoma, and possible UTI.   Patient was admitted, seen by surgery, being managed conservatively  Subjective:  Reports he feels about the same, still has abdominal pain and nausea.  No vomiting and no nausea this morning.  PEG tube had about a liter greenish output.  Reports she is passing gas and had small bowel movement. No acute events overnight afebrile.  Assessment & Plan:  SBO: xray this am with grossly stable small bowel dilatation, concerning for distal small bowel obstruction. Surgery on board and cont plan per Surgery with n.p.o,continue G-tube to gravity Continue on IV fluid hydration, IV Phenergan and opiates for pain control-she is requesting dilaudid instead of morphine. cont serial x-rays and clinical monitoring.  Patient is having ice chips.  Overall afebrile, no leukocytosis.  Rectus sheath hematoma, Lovenox has been discontinued.  She reports she has been on prophylactic Lovenox for few months now. CT scan showed hematoma 17.5 x 13.0 x 7.5 cm.  Hemoglobin has improved to 9 g.  Monitor hemoglobin and  transfuse as needed.  Doubt she is a candidate for anticoagulation on long-term.  Acute blood blood loss anemia secondary to hematoma.  Baseline hemoglobin around 9.8 g.  Status post 2 unit PRBC transfusion. Stable.Monitor. Recent Labs  Lab 08/10/18 1440 08/11/18 0038 08/12/18 1038 08/13/18 0452  HGB 7.1* 8.1* 9.7* 9.5*  HCT 23.7* 27.5* 32.0* 33.3*   Quadriplegia with trach and PEG and SP catheter in place.  Continue supportive care, trach collar, supplemental oxygen parameters.once able to take meds resume baclofen 5 mg tid, gabapentin 300 mg tid. Cont nebs, incurse ellipta and pulm toiletting.  UTI POA on Rocephin.T-max 100.8,blood culture no growth so far,Urine culture multiple species present. Cont empiric antibiotics.  Bed sore in buttock and sacrum.  Continue wound care.  Continue offloading,  T2DM: Blood sugar controlled, continue sliding scale insulin every 4 hours while n.p.o., hold oral meds.   Recent Labs  Lab 08/12/18 1656 08/12/18 2014 08/13/18 0009 08/13/18 0403 08/13/18 0743  GLUCAP 89 100* 95 92 103*   Depression/Insomnia:Mood is stable, when able to take p.o. resume cymbalta, wellbutrin  Chronic Hypotension:patient reports midodrine was d/c'd, continue to monitor off midodrine.  Pressure fairly stable.  Hx COPD:continue home meds  Elevated istat hcg: repeat serum HCG was 4 (ref <5).  DVT prophylaxis: SCD Code Status: Full code Family Communication: plan of care discussed with patient in detail. I called and updated pt's mother 7/23 Disposition Plan: Remains inpatient pending clinical improvement and once signed off by surgery. Will need SNF  Consultants:  General surgery  Procedures: None  7/23:Abd portable  Stable small bowel  dilatation is noted concerning for distal small bowel obstruction.  AXR 7/24:  Grossly stable small bowel dilatation is noted concerning for distal small bowel obstruction.  CT abdomen/pelvis:08/10/2018  1.CT findings  consistent with a fairly high-grade small-bowel obstruction in the mid small bowel region in the central abdomen at the level of the iliac crest. This is most likely due to adhesions. 2.Very large left rectus sheath hematoma. 3.Feeding gastrostomy tube and suprapubic catheter is in good position without complicating features. 4.Sacral decubitus ulcer. 5.Marked age advanced vascular calcifications for age. 6.Lower pole left renal calculus but no obstructing ureteral Calculi.  Microbiology: Urine culture pending Antimicrobials: Anti-infectives (From admission, onward)   Start     Dose/Rate Route Frequency Ordered Stop   08/11/18 0200  cefTRIAXone (ROCEPHIN) 1 g in sodium chloride 0.9 % 100 mL IVPB     1 g 200 mL/hr over 30 Minutes Intravenous Every 24 hours 08/10/18 1921     08/10/18 1800  aztreonam (AZACTAM) 2 g in sodium chloride 0.9 % 100 mL IVPB     2 g 200 mL/hr over 30 Minutes Intravenous  Once 08/10/18 1756 08/10/18 2011       Objective: Vitals:   08/13/18 0343 08/13/18 0546 08/13/18 0703 08/13/18 0721  BP:  (!) 145/86    Pulse: 87 89  84  Resp: 16 20  15   Temp:  99 F (37.2 C)    TempSrc:  Oral    SpO2: 99% 100%  100%  Weight:   59 kg   Height:        Intake/Output Summary (Last 24 hours) at 08/13/2018 1052 Last data filed at 08/13/2018 0609 Gross per 24 hour  Intake 385.07 ml  Output 1250 ml  Net -864.93 ml   Filed Weights   08/11/18 0024 08/12/18 0500 08/13/18 0703  Weight: 47.5 kg 25.4 kg 59 kg   Weight change:   Body mass index is 23.79 kg/m.  Intake/Output from previous day: 07/23 0701 - 07/24 0700 In: 390.1 [P.O.:10; I.V.:380.1] Out: 1250 [Urine:1250] Intake/Output this shift: No intake/output data recorded.  Examination:  General exam: Anxious, in mild pain, older than stated age, thin, chronically sick looking.   HEENT:PERRL,Oral mucosa moist, Ear/Nose normal on gross exam, trach+ w trach collar Respiratory system: Bilateral equal air entry,  normal vesicular breath sounds, no wheezes or crackles  Cardiovascular system: S1 & S2 heard,No JVD, murmurs. Gastrointestinal system: Abdomen is tender moderately distended, PEG tube present to gravity, SP tube + Nervous System:Alert and oriented. LE weakness, contractures+ Extremities: No edema, no clubbing, distal peripheral pulses palpable. Skin: No rashes, lesions, no icterus MSK: Normal muscle bulk,tone ,power Bed sore in buttock and sacrum.   Medications:  Scheduled Meds: . sodium chloride   Intravenous Once  . buPROPion  150 mg Oral Daily  . Chlorhexidine Gluconate Cloth  6 each Topical Q0600  . DULoxetine  30 mg Oral BID  . gabapentin  300 mg Oral TID  . insulin aspart  0-9 Units Subcutaneous Q4H  . mupirocin ointment  1 application Nasal BID  . umeclidinium bromide  1 puff Inhalation Daily  . zolpidem  2.5 mg Oral QHS   Continuous Infusions: . 0.9 % NaCl with KCl 20 mEq / L 100 mL/hr at 08/12/18 2247  . cefTRIAXone (ROCEPHIN)  IV 1 g (08/13/18 0300)    Data Reviewed: I have personally reviewed following labs and imaging studies  CBC: Recent Labs  Lab 08/10/18 1440 08/11/18 0038 08/12/18 1038 08/13/18 0452  WBC  13.3* 12.4* 10.3 8.5  NEUTROABS 11.4*  --   --   --   HGB 7.1* 8.1* 9.7* 9.5*  HCT 23.7* 27.5* 32.0* 33.3*  MCV 91.5 92.0 92.8 97.1  PLT 470* 457* 441* 161*   Basic Metabolic Panel: Recent Labs  Lab 08/10/18 1440 08/11/18 0038 08/12/18 1038 08/13/18 0452  NA 135 136 142 143  K 4.2 3.8 3.7 3.7  CL 92* 99 99 104  CO2 28 24 23  18*  GLUCOSE 178* 145* 119* 105*  BUN 17 14 18 17   CREATININE 0.54 0.55 0.69 0.61  CALCIUM 9.7 8.9 9.5 9.0   GFR: Estimated Creatinine Clearance: 68.8 mL/min (by C-G formula based on SCr of 0.61 mg/dL). Liver Function Tests: Recent Labs  Lab 08/10/18 1440 08/11/18 0038 08/12/18 1038  AST 18 13* 12*  ALT 16 13 14   ALKPHOS 157* 133* 131*  BILITOT 0.7 0.4 1.2  PROT 8.7* 7.8 7.6  ALBUMIN 3.4* 3.0* 3.2*   Recent  Labs  Lab 08/10/18 1440  LIPASE 19   No results for input(s): AMMONIA in the last 168 hours. Coagulation Profile: No results for input(s): INR, PROTIME in the last 168 hours. Cardiac Enzymes: No results for input(s): CKTOTAL, CKMB, CKMBINDEX, TROPONINI in the last 168 hours. BNP (last 3 results) No results for input(s): PROBNP in the last 8760 hours. HbA1C: No results for input(s): HGBA1C in the last 72 hours. CBG: Recent Labs  Lab 08/12/18 1656 08/12/18 2014 08/13/18 0009 08/13/18 0403 08/13/18 0743  GLUCAP 89 100* 95 92 103*   Lipid Profile: No results for input(s): CHOL, HDL, LDLCALC, TRIG, CHOLHDL, LDLDIRECT in the last 72 hours. Thyroid Function Tests: No results for input(s): TSH, T4TOTAL, FREET4, T3FREE, THYROIDAB in the last 72 hours. Anemia Panel: Recent Labs    08/10/18 1500  VITAMINB12 493  FOLATE 24.0  FERRITIN 84  TIBC 313  IRON 29   Sepsis Labs: Recent Labs  Lab 08/10/18 1448 08/11/18 0038 08/12/18 1413  LATICACIDVEN 1.1 0.9 0.8    Recent Results (from the past 240 hour(s))  Urine culture     Status: Abnormal   Collection Time: 08/10/18  2:06 PM   Specimen: Urine, Catheterized  Result Value Ref Range Status   Specimen Description   Final    Urine Performed at [facility] [address], [place], [place] [address]    Special Requests   Final    NONE Performed at [facility], [place] [address], [place], [place] [address]    Culture MULTIPLE SPECIES PRESENT, SUGGEST RECOLLECTION (A)  Final   Report Status 08/12/2018 FINAL  Final  Culture, blood (routine x 2)     Status: None (Preliminary result)   Collection Time: 08/10/18  2:42 PM   Specimen: BLOOD  Result Value Ref Range Status   Specimen Description   Final    BLOOD RIGHT ARM Performed at [facility] [address], [place], [place] [address]    Special Requests   Final    BOTTLES DRAWN AEROBIC AND ANAEROBIC Blood Culture  adequate volume Performed at [facility] [address] [place], [place] [address]    Culture   Final    NO GROWTH 2 DAYS Performed at [facility] [address] [place],  [address]    Report Status PENDING  Incomplete  Culture, blood (routine x 2)     Status: None (Preliminary result)   Collection Time: 08/10/18  3:29 PM  Specimen: BLOOD LEFT HAND  Result Value Ref Range Status   Specimen Description   Final    BLOOD LEFT HAND Performed at [facility] [address] [place], [place] [address]    Special Requests   Final    BOTTLES DRAWN AEROBIC AND ANAEROBIC Blood Culture adequate volume Performed at [facility] [address], [place], [place] [address]    Culture   Final    NO GROWTH 2 DAYS Performed at [facility] [address], [place], [place] [address]    Report Status PENDING  Incomplete  SARS Coronavirus 2 (CEPHEID - Performed in [facility] hospital lab), Hosp Order     Status: None   Collection Time: 08/10/18  6:20 PM   Specimen: Nasopharyngeal Swab  Result Value Ref Range Status   SARS Coronavirus 2 NEGATIVE NEGATIVE Final    Comment: (NOTE) If result is NEGATIVE SARS-CoV-2 target nucleic acids are NOT DETECTED. The SARS-CoV-2 RNA is generally detectable in upper and lower  respiratory specimens during the acute phase of infection. The lowest  concentration of SARS-CoV-2 viral copies this assay can detect is 250  copies / mL. A negative result does not preclude SARS-CoV-2 infection  and should not be used as the sole basis for treatment or other  patient management decisions.  A negative result may occur with  improper specimen collection / handling, submission of specimen other  than nasopharyngeal swab, presence of viral mutation(s) within the  areas targeted by this assay, and inadequate number of viral copies  (<250 copies / mL). A negative result must be combined with  clinical  observations, patient history, and epidemiological information. If result is POSITIVE SARS-CoV-2 target nucleic acids are DETECTED. The SARS-CoV-2 RNA is generally detectable in upper and lower  respiratory specimens dur ing the acute phase of infection.  Positive  results are indicative of active infection with SARS-CoV-2.  Clinical  correlation with patient history and other diagnostic information is  necessary to determine patient infection status.  Positive results do  not rule out bacterial infection or co-infection with other viruses. If result is PRESUMPTIVE POSTIVE SARS-CoV-2 nucleic acids MAY BE PRESENT.   A presumptive positive result was obtained on the submitted specimen  and confirmed on repeat testing.  While 2019 novel coronavirus  (SARS-CoV-2) nucleic acids may be present in the submitted sample  additional confirmatory testing may be necessary for epidemiological  and / or clinical management purposes  to differentiate between  SARS-CoV-2 and other Sarbecovirus currently known to infect humans.  If clinically indicated additional testing with an alternate test  methodology [phone]) is advised. The SARS-CoV-2 RNA is generally  detectable in upper and lower respiratory sp ecimens during the acute  phase of infection. The expected result is Negative. Fact Sheet for Patients:  StrictlyIdeas.no Fact Sheet for Healthcare Providers: BankingDealers.co.za This test is not yet approved or cleared by the [place] FDA and has been authorized for detection and/or diagnosis of SARS-CoV-2 by FDA under an Emergency Use Authorization (EUA).  This EUA will remain in effect (meaning this test can be used) for the duration of the COVID-19 declaration under Section 564(b)(1) of the Act, 21 U.S.C. section 360bbb-3(b)(1), unless the authorization is terminated or revoked sooner. Performed at [facility],  [place] [address], [place], [place] [address]   MRSA PCR Screening     Status: Abnormal   Collection Time: 08/11/18  1:42 AM   Specimen: Nasal Mucosa;  Nasopharyngeal  Result Value Ref Range Status   MRSA by PCR POSITIVE (A) NEGATIVE Final    Comment:        The GeneXpert MRSA Assay (FDA approved for NASAL specimens only), is one component of a comprehensive MRSA colonization surveillance program. It is not intended to diagnose MRSA infection nor to guide or monitor treatment for MRSA infections. RESULT CALLED TO, READ BACK BY AND VERIFIED WITH: ELLIS,C @ 0425 ON [id number] BY POTEAT,S Performed at [facility] [address] [place], [place] [address]       Radiology Studies: Dg Abd Portable 1v  Result Date: 08/13/2018 CLINICAL DATA:  Small-bowel obstruction. EXAM: PORTABLE ABDOMEN - 1 VIEW COMPARISON:  Radiographs of August 12, 2018. FINDINGS: Grossly stable small bowel dilatation is noted centrally. Residual contrast is noted within the gastric fundus. Gastrostomy tube is unchanged in position. IMPRESSION: Grossly stable small bowel dilatation is noted concerning for distal small bowel obstruction. Electronically Signed   By: Marijo Conception M.D.   On: 08/13/2018 08:08   Dg Abd Portable 1v  Result Date: 08/12/2018 CLINICAL DATA:  Abdominal pain, abdominal distention. EXAM: PORTABLE ABDOMEN - 1 VIEW COMPARISON:  Radiograph of August 11, 2018. FINDINGS: Stable small bowel dilatation is noted concerning for distal small bowel obstruction. No colonic dilatation is noted. Residual contrast remains within the gastric fundus. Gastrostomy tube is again noted and unchanged. IMPRESSION: Stable small bowel dilatation is noted concerning for distal small bowel obstruction. Electronically Signed   By: Marijo Conception M.D.   On: 08/12/2018 12:01   Dg Abd Portable 1v-small Bowel Obstruction Protocol-initial, 8 Hr Delay  Result Date: 08/11/2018 CLINICAL DATA:  Small bowel  obstruction. EXAM: PORTABLE ABDOMEN - 1 VIEW COMPARISON:  CT scan dated 08/10/2018 FINDINGS: There are multiple persistent dilated loops of small bowel. Contrast is also seen in the fundus of the stomach. Gastrostomy tube in place. Colon does not appear distended. IMPRESSION: Persistent small bowel obstruction, slightly progressed. Electronically Signed   By: Lorriane Shire M.D.   On: 08/11/2018 18:11      LOS: 3 days   Time spent: More than 50% of that time was spent in counseling and/or coordination of care.  Antonieta Pert, MD [facility]  08/13/2018, 10:52 AM

## 2018-08-13 NOTE — Progress Notes (Signed)
CC:  Abdominal pain  Subjective: No real data on how much is coming out of the PEG, nursing notes says about 1 liter yesterday at 3:35PM  and another 1 liter in the bag now.  I irrigated the tube and it is draining well, It flushes and you can aspirate fluid easily.  She has a fair amount coming out now, some of it is cloudy, what is in the bag is clear green.  She is still complaining of pain and wants more pain medicine now.  She also has an indwelling suprapubic catheter draining.  Both sites are fine.  Stomach is still distended and BS are very hypoactive.  She says she had some stool, but nurse on this AM is not sure of this.  Nothing recorded.  PEG tube is very thin walled and collapses easily in certain areas when manipulating or aspirating from it.    Objective: Vital signs in last 24 hours: Temp:  [98.1 F (36.7 C)-99.7 F (37.6 C)] 99 F (37.2 C) (07/24 0546) Pulse Rate:  [84-98] 84 (07/24 0721) Resp:  [15-20] 15 (07/24 0721) BP: (129-150)/(85-96) 145/86 (07/24 0546) SpO2:  [98 %-100 %] 100 % (07/24 0721) FiO2 (%):  [28 %] 28 % (07/24 0721) Weight:  [59 kg] 59 kg (07/24 0703) Last BM Date: 08/11/18 10 PO 390IV 1250 urine Afebrile, VSS Labs OK [id number] stable small bowel dilatation is noted centrally. Residual contrast is noted within the gastric fundus. Gastrostomy tube is unchanged in position.  Intake/Output from previous day: 07/23 0701 - 07/24 0700 In: 390.1 [P.O.:10; I.V.:380.1] Out: 1250 [Urine:1250] Intake/Output this shift: No intake/output data recorded.  General appearance: alert, cooperative and no distress GI: Abdomen is still distended and complaining of pain.  Green drainage from the PEG.  ? BM, does not look improved so far.  Lab Results:  Recent Labs    08/12/18 1038 08/13/18 0452  WBC 10.3 8.5  HGB 9.7* 9.5*  HCT 32.0* 33.3*  PLT 441* 452*    BMET Recent Labs    08/12/18 1038 08/13/18 0452  NA 142 143  K 3.7 3.7  CL 99 104   CO2 23 18*  GLUCOSE 119* 105*  BUN 18 17  CREATININE 0.69 0.61  CALCIUM 9.5 9.0   PT/INR No results for input(s): LABPROT, INR in the last 72 hours.  Recent Labs  Lab 08/10/18 1440 08/11/18 0038 08/12/18 1038  AST 18 13* 12*  ALT 16 13 14   ALKPHOS 157* 133* 131*  BILITOT 0.7 0.4 1.2  PROT 8.7* 7.8 7.6  ALBUMIN 3.4* 3.0* 3.2*     Lipase     Component Value Date/Time   LIPASE 19 08/10/2018 1440     Medications: . sodium chloride   Intravenous Once  . buPROPion  150 mg Oral Daily  . Chlorhexidine Gluconate Cloth  6 each Topical Q0600  . DULoxetine  30 mg Oral BID  . gabapentin  300 mg Oral TID  . insulin aspart  0-9 Units Subcutaneous Q4H  . mupirocin ointment  1 application Nasal BID  . umeclidinium bromide  1 puff Inhalation Daily  . zolpidem  2.5 mg Oral QHS    Assessment/Plan Quadriplegia s/p trach and PEG and SP catheter DM Depression Chronic hypotension COPD Sacral decubitus ulcer Anemia - Hgb 8.1 (7/22), monitor UTI - on rocephin  SBO vs ileus from rectus sheath hematoma - CT scan 7/21 shows SBO, large left rectus sheath hematoma17.5 x 13.0 x 7.5 cm - Small bowel  protocol started 7/22 and delayed film showed persistent SBO with contrast in the stomach  ID -rocephin 7/22>> FEN -IVF, NPO, G-tube to gravity VTE -SCDs, lovenox on hold due to rectus sheath hematoma Foley -chronic SP catheter Follow up -TBD   Plan:  Continue PEG drainage, and conservative rx.  She is on Miralax at SNF, so constipation is not a normal issue with her.     LOS: 3 days    Rilyn Scroggs 08/13/2018 [phone]

## 2018-08-13 NOTE — TOC Progression Note (Signed)
Transition of Care [facility]) - Progression Note    Patient Details  Name: Angela Page MRN: [id number] Date of Birth: 07-03-1970  [facility] CM/SW Contact  Marlayna Bannister, Juliann Pulse, RN Phone Number: 08/13/2018, 3:46 PM  Clinical Narrative:received call from [facility] have COVID + @ facility-CM informed patient/& her mother-voiced understanding. They both still want to d/c back [place]. Patient not a candidate for [facility] @ select specialty,& they are not willing to go to [facility] for LTACH. D/c plan back to [facility].       Expected Discharge Plan: [place] Barriers to Discharge: Continued Medical Work up  Expected Discharge Plan and Services Expected Discharge Plan: [place]   Discharge Planning Services: CM Consult Post Acute Care Choice: [place] Living arrangements for the past 2 months: [place] Determinants of Health (SDOH) Interventions    Readmission Risk Interventions Readmission Risk Prevention Plan 06/24/2018  Transportation Screening Complete  Medication Review [employer]) Complete  HRI or Home Care Consult Complete  SW Recovery Care/Counseling Consult Complete  Palliative Care Screening Not Applicable  Skilled Nursing Facility Complete  Some recent data might be hidden

## 2018-08-14 LAB — GLUCOSE, CAPILLARY
Glucose-Capillary: 105 mg/dL — ABNORMAL HIGH (ref 70–99)
Glucose-Capillary: 89 mg/dL (ref 70–99)
Glucose-Capillary: 90 mg/dL (ref 70–99)
Glucose-Capillary: 93 mg/dL (ref 70–99)
Glucose-Capillary: 94 mg/dL (ref 70–99)
Glucose-Capillary: 95 mg/dL (ref 70–99)

## 2018-08-14 LAB — BASIC METABOLIC PANEL
Anion gap: 18 — ABNORMAL HIGH (ref 5–15)
BUN: 12 mg/dL (ref 6–20)
CO2: 16 mmol/L — ABNORMAL LOW (ref 22–32)
Calcium: 9.4 mg/dL (ref 8.9–10.3)
Chloride: 111 mmol/L (ref 98–111)
Creatinine, Ser: 0.59 mg/dL (ref 0.44–1.00)
GFR calc Af Amer: >60 mL/min (ref >60)
GFR calc non Af Amer: >60 mL/min (ref >60)
Glucose, Bld: 108 mg/dL — ABNORMAL HIGH (ref 70–99)
Potassium: 4 mmol/L (ref 3.5–5.1)
Sodium: 145 mmol/L (ref 135–145)

## 2018-08-14 LAB — CBC
HCT: 35.4 % — ABNORMAL LOW (ref 36.0–46.0)
Hemoglobin: 10.2 g/dL — ABNORMAL LOW (ref 12.0–15.0)
MCH: 28.2 pg (ref 26.0–34.0)
MCHC: 28.8 g/dL — ABNORMAL LOW (ref 30.0–36.0)
MCV: 97.8 fL (ref 80.0–100.0)
Platelets: 199 10*3/uL (ref 150–400)
RBC: 3.62 MIL/uL — ABNORMAL LOW (ref 3.87–5.11)
RDW: 16.5 % — ABNORMAL HIGH (ref 11.5–15.5)
WBC: 8.9 10*3/uL (ref 4.0–10.5)
nRBC: 0 % (ref 0.0–0.2)

## 2018-08-14 MED ORDER — SODIUM CHLORIDE 0.9% FLUSH
10.0000 mL | Freq: Two times a day (BID) | INTRAVENOUS | Status: DC
  Administered 2018-08-18 – 2018-08-24 (×6): 10 mL

## 2018-08-14 MED ORDER — LORAZEPAM 2 MG/ML IJ SOLN
0.5000 mg | Freq: Once | INTRAMUSCULAR | Status: DC
  Filled 2018-08-14: qty 1

## 2018-08-14 MED ORDER — LORAZEPAM 2 MG/ML IJ SOLN
0.5000 mg | Freq: Once | INTRAMUSCULAR | Status: AC
  Administered 2018-08-14: 0.5 mg via INTRAVENOUS
  Filled 2018-08-14: qty 1

## 2018-08-14 NOTE — Progress Notes (Signed)
PROGRESS NOTE    Angela Page  OEV:[id number] DOB: Jul 31, 1970 DOA: 08/10/2018 PCP: Default, Provider, MD   Brief Narrative: 48 y.o. female with medical history significant of traumatic C spine fracture resulting in quadriplegia s/p trach, peg, suprapubic catheter as well as COPD, chronic hypotension, T2DM, chronic pain, SSS s/p pacemaker, and chronic decubitus ulcers presenting with abdominal pain.  She notes he sx started last night after tube feeds were starting.  She developed worsening abdominal pain, nausea, and distension.  She presented to the ED due to worsening abdominal sx. No vomiting until in ED.  No F, chills, CP, SOB.  Continues to have BM.  No known COVID exposures.   ED Course: Imaging concerning for SBO and rectus sheath hematoma.  Labs with anemia.  Surgery c/s.  Labs, imaging.  Abx for possible UTI.  Hospitalist to admit for SBO, rectus sheath hematoma, and possible UTI.   Patient was admitted, seen by surgery, being managed conservatively  Subjective: Had large BM last night. C/o ongoing abd pain- wants to take  Her regular meds. On trach collar, no fever, has some dry cough.  Assessment & Plan:  SBO vs ilues fro mrectus sheath hematoma: xray 7/24 with grossly stable small bowel dilatation, concerning for distal small bowel obstruction. But has constipation at [facility] -on miralax.Had bm last night. ongoing abd pain-Surgery on board and cont plan per Surgery with sipd of meds today- resume all her home meds, relaxant. Cont G-tube to gravity, ivf IV Phenergan and opiates for pain control- on dilaudid. Overall afebrile, no leukocytosis.  Rectus sheath hematoma, Lovenox has been discontinued.  She reports she has been on prophylactic Lovenox for few months now. CT scan showed hematoma 17.5 x 13.0 x 7.5 cm.  Hemoglobin has improved to 9 g.  Monitor hemoglobin and transfuse as needed.  Doubt she is a candidate for anticoagulation on long-term.  Acute blood blood loss anemia  secondary to hematoma.  Baseline hemoglobin and now improved as below. Status post 2 unit PRBC transfusion.monitor Recent Labs  Lab 08/10/18 1440 08/11/18 0038 08/12/18 1038 08/13/18 0452 08/14/18 0910  HGB 7.1* 8.1* 9.7* 9.5* 10.2*  HCT 23.7* 27.5* 32.0* 33.3* 35.4*   Quadriplegia with trach and PEG and SP catheter in place.  Continue supportive care, trach collar, supplemental oxygen parameters.once able to take meds resume baclofen 5 mg tid, gabapentin 300 mg tid. Cont nebs, incurse ellipta and pulm toiletting.  UTI POA on Rocephin. Afebrile now. culture no growth so far,Urine culture multiple species present. Cont empiric antibiotics-to complete course.  Bed sore in buttock and sacrum.  Continue wound care.  Continue offloading,  T2DM: Blood sugar controlled, continue sliding scale insulin every 4 hours while n.p.o., hold oral meds.   Recent Labs  Lab 08/13/18 1705 08/13/18 2052 08/14/18 0036 08/14/18 0409 08/14/18 0751  GLUCAP 105* 94 89 95 94   Depression/Insomnia:Mood is stable, when able to take p.o. resume cymbalta, wellbutrin  Chronic Hypotension:patient reports midodrine was d/c'd, continue to monitor off midodrine.  Pressure fairly stable.  Hx COPD:continue home meds  Elevated istat hcg: repeat serum HCG was 4 (ref <5).  DVT prophylaxis: SCD Code Status: Full code Family Communication: plan of care discussed with patient in detail. I had called and updated pt's mother previously Disposition Plan: Remains inpatient pending clinical improvement and once signed off by surgery.Will need SNF  Consultants:  General surgery  Procedures: None  7/23:Abd portable  Stable small bowel dilatation is noted concerning for distal small bowel  obstruction.  AXR 7/24:  Grossly stable small bowel dilatation is noted concerning for distal small bowel obstruction.  CT abdomen/pelvis:08/10/2018  1.CT findings consistent with a fairly high-grade small-bowel obstruction in  the mid small bowel region in the central abdomen at the level of the iliac crest. This is most likely due to adhesions. 2.Very large left rectus sheath hematoma. 3.Feeding gastrostomy tube and suprapubic catheter is in good position without complicating features. 4.Sacral decubitus ulcer. 5.Marked age advanced vascular calcifications for age. 6.Lower pole left renal calculus but no obstructing ureteral Calculi.  Microbiology: Urine culture pending Antimicrobials: Anti-infectives (From admission, onward)   Start     Dose/Rate Route Frequency Ordered Stop   08/11/18 0200  cefTRIAXone (ROCEPHIN) 1 g in sodium chloride 0.9 % 100 mL IVPB     1 g 200 mL/hr over 30 Minutes Intravenous Every 24 hours 08/10/18 1921     08/10/18 1800  aztreonam (AZACTAM) 2 g in sodium chloride 0.9 % 100 mL IVPB     2 g 200 mL/hr over 30 Minutes Intravenous  Once 08/10/18 1756 08/10/18 2011       Objective: Vitals:   08/13/18 2049 08/13/18 2324 08/14/18 0413 08/14/18 0429  BP: (!) 144/84  (!) 171/94   Pulse: 79 82 79   Resp: 14 16 18    Temp: 98.6 F (37 C)  98 F (36.7 C)   TempSrc: Oral  Oral   SpO2: 100% 94% 100%   Weight:    47.2 kg  Height:        Intake/Output Summary (Last 24 hours) at 08/14/2018 1000 Last data filed at 08/14/2018 0755 Gross per 24 hour  Intake 3784.87 ml  Output 2350 ml  Net 1434.87 ml   Filed Weights   08/12/18 0500 08/13/18 0703 08/14/18 0429  Weight: 25.4 kg 59 kg 47.2 kg   Weight change:   Body mass index is 19.02 kg/m.  Intake/Output from previous day: 07/24 0701 - 07/25 0700 In: 3769.9 [I.V.:3569.9; IV Piggyback:200] Out: 2300 [Urine:1200; Stool:1100] Intake/Output this shift: Total I/O In: 15 [P.O.:15] Out: 450 [Urine:450]  Examination:  General exam: Anxious, in mild pain, chronically sick looking. HEENT:PERRL,Oral mucosa moist, Ear/Nose normal on gross exam, trach present with trach collar.  Respiratory system: Clear bilaterally with no crackles.    Cardiovascular system: S1 & S2 heard,No JVD, murmurs. Gastrointestinal system: Abdomen is firm, tender moderately distended suprapubic tube present PEG tube present to drain Nervous System:Alert and oriented. LE weakness, contractures+ Extremities: No edema, no clubbing, distal peripheral pulses palpable. Skin: No rashes, lesions, no icterus MSK: Normal muscle bulk,tone ,power Bed sore in buttock and sacrum.   Medications:  Scheduled Meds: . sodium chloride   Intravenous Once  . buPROPion  150 mg Oral Daily  . Chlorhexidine Gluconate Cloth  6 each Topical Q0600  . DULoxetine  30 mg Oral BID  . gabapentin  300 mg Oral TID  . insulin aspart  0-9 Units Subcutaneous Q4H  . mupirocin ointment  1 application Nasal BID  . umeclidinium bromide  1 puff Inhalation Daily  . zolpidem  2.5 mg Oral QHS   Continuous Infusions: . 0.9 % NaCl with KCl 20 mEq / L 100 mL/hr at 08/13/18 2255  . cefTRIAXone (ROCEPHIN)  IV 1 g (08/14/18 0200)    Data Reviewed: I have personally reviewed following labs and imaging studies  CBC: Recent Labs  Lab 08/10/18 1440 08/11/18 0038 08/12/18 1038 08/13/18 0452 08/14/18 0910  WBC 13.3* 12.4* 10.3 8.5 8.9  NEUTROABS 11.4*  --   --   --   --   HGB 7.1* 8.1* 9.7* 9.5* 10.2*  HCT 23.7* 27.5* 32.0* 33.3* 35.4*  MCV 91.5 92.0 92.8 97.1 97.8  PLT 470* 457* 441* 452* 157   Basic Metabolic Panel: Recent Labs  Lab 08/10/18 1440 08/11/18 0038 08/12/18 1038 08/13/18 0452 08/14/18 0910  NA 135 136 142 143 145  K 4.2 3.8 3.7 3.7 4.0  CL 92* 99 99 104 111  CO2 28 24 23  18* 16*  GLUCOSE 178* 145* 119* 105* 108*  BUN 17 14 18 17 12   CREATININE 0.54 0.55 0.69 0.61 0.59  CALCIUM 9.7 8.9 9.5 9.0 9.4   GFR: Estimated Creatinine Clearance: 64.8 mL/min (by C-G formula based on SCr of 0.59 mg/dL). Liver Function Tests: Recent Labs  Lab 08/10/18 1440 08/11/18 0038 08/12/18 1038  AST 18 13* 12*  ALT 16 13 14   ALKPHOS 157* 133* 131*  BILITOT 0.7 0.4 1.2   PROT 8.7* 7.8 7.6  ALBUMIN 3.4* 3.0* 3.2*   Recent Labs  Lab 08/10/18 1440  LIPASE 19   No results for input(s): AMMONIA in the last 168 hours. Coagulation Profile: No results for input(s): INR, PROTIME in the last 168 hours. Cardiac Enzymes: No results for input(s): CKTOTAL, CKMB, CKMBINDEX, TROPONINI in the last 168 hours. BNP (last 3 results) No results for input(s): PROBNP in the last 8760 hours. HbA1C: No results for input(s): HGBA1C in the last 72 hours. CBG: Recent Labs  Lab 08/13/18 1705 08/13/18 2052 08/14/18 0036 08/14/18 0409 08/14/18 0751  GLUCAP 105* 94 89 95 94   Lipid Profile: No results for input(s): CHOL, HDL, LDLCALC, TRIG, CHOLHDL, LDLDIRECT in the last 72 hours. Thyroid Function Tests: No results for input(s): TSH, T4TOTAL, FREET4, T3FREE, THYROIDAB in the last 72 hours. Anemia Panel: No results for input(s): VITAMINB12, FOLATE, FERRITIN, TIBC, IRON, RETICCTPCT in the last 72 hours. Sepsis Labs: Recent Labs  Lab 08/10/18 1448 08/11/18 0038 08/12/18 1413  LATICACIDVEN 1.1 0.9 0.8    Recent Results (from the past 240 hour(s))  Urine culture     Status: Abnormal   Collection Time: 08/10/18  2:06 PM   Specimen: Urine, Catheterized  Result Value Ref Range Status   Specimen Description   Final    Urine Performed at [facility] [address], [place], [place] [address]    Special Requests   Final    NONE Performed at [facility], [place] [address] [place], [place] [address]    Culture MULTIPLE SPECIES PRESENT, SUGGEST RECOLLECTION (A)  Final   Report Status 08/12/2018 FINAL  Final  Culture, blood (routine x 2)     Status: None (Preliminary result)   Collection Time: 08/10/18  2:42 PM   Specimen: BLOOD  Result Value Ref Range Status   Specimen Description   Final    BLOOD RIGHT ARM Performed at [facility] [address], [place], [place] [address]    Special Requests   Final     BOTTLES DRAWN AEROBIC AND ANAEROBIC Blood Culture adequate volume Performed at [facility] [address], [place], [place] [address]    Culture   Final    NO GROWTH 4 DAYS Performed at [facility], [place] [address] [place],  [address]    Report Status PENDING  Incomplete  Culture, blood (routine x 2)     Status: None (Preliminary result)   Collection Time: 08/10/18  3:29 PM   Specimen: BLOOD LEFT HAND  Result Value Ref Range Status   Specimen Description   Final    BLOOD LEFT HAND Performed at [facility] [address], [place], [place] [address]    Special Requests   Final    BOTTLES DRAWN AEROBIC AND ANAEROBIC Blood Culture adequate volume Performed at [facility] [address], [place], [place] [address]    Culture   Final    NO GROWTH 4 DAYS Performed at [facility], [place] [address], [place], [place] [address]    Report Status PENDING  Incomplete  SARS Coronavirus 2 (CEPHEID - Performed in [facility] lab), Hosp Order     Status: None   Collection Time: 08/10/18  6:20 PM   Specimen: Nasopharyngeal Swab  Result Value Ref Range Status   SARS Coronavirus 2 NEGATIVE NEGATIVE Final    Comment: (NOTE) If result is NEGATIVE SARS-CoV-2 target nucleic acids are NOT DETECTED. The SARS-CoV-2 RNA is generally detectable in upper and lower  respiratory specimens during the acute phase of infection. The lowest  concentration of SARS-CoV-2 viral copies this assay can detect is 250  copies / mL. A negative result does not preclude SARS-CoV-2 infection  and should not be used as the sole basis for treatment or other  patient management decisions.  A negative result may occur with  improper specimen collection / handling, submission of specimen other  than nasopharyngeal swab, presence of viral mutation(s) within the  areas targeted by this assay, and inadequate number of viral copies  (<250  copies / mL). A negative result must be combined with clinical  observations, patient history, and epidemiological information. If result is POSITIVE SARS-CoV-2 target nucleic acids are DETECTED. The SARS-CoV-2 RNA is generally detectable in upper and lower  respiratory specimens dur ing the acute phase of infection.  Positive  results are indicative of active infection with SARS-CoV-2.  Clinical  correlation with patient history and other diagnostic information is  necessary to determine patient infection status.  Positive results do  not rule out bacterial infection or co-infection with other viruses. If result is PRESUMPTIVE POSTIVE SARS-CoV-2 nucleic acids MAY BE PRESENT.   A presumptive positive result was obtained on the submitted specimen  and confirmed on repeat testing.  While 2019 novel coronavirus  (SARS-CoV-2) nucleic acids may be present in the submitted sample  additional confirmatory testing may be necessary for epidemiological  and / or clinical management purposes  to differentiate between  SARS-CoV-2 and other Sarbecovirus currently known to infect humans.  If clinically indicated additional testing with an alternate test  methodology [phone]) is advised. The SARS-CoV-2 RNA is generally  detectable in upper and lower respiratory sp ecimens during the acute  phase of infection. The expected result is Negative. Fact Sheet for Patients:  StrictlyIdeas.no Fact Sheet for Healthcare Providers: BankingDealers.co.za This test is not yet approved or cleared by the [place] FDA and has been authorized for detection and/or diagnosis of SARS-CoV-2 by FDA under an Emergency Use Authorization (EUA).  This EUA will remain in effect (meaning this test can be used) for the duration of the COVID-19 declaration under Section 564(b)(1) of the Act, 21 U.S.C. section 360bbb-3(b)(1), unless the authorization is terminated or revoked  sooner. Performed at [facility], [place] [address] [place], [place] [address]   MRSA PCR Screening     Status: Abnormal   Collection Time: 08/11/18  1:42 AM  Specimen: Nasal Mucosa; Nasopharyngeal  Result Value Ref Range Status   MRSA by PCR POSITIVE (A) NEGATIVE Final    Comment:        The GeneXpert MRSA Assay (FDA approved for NASAL specimens only), is one component of a comprehensive MRSA colonization surveillance program. It is not intended to diagnose MRSA infection nor to guide or monitor treatment for MRSA infections. RESULT CALLED TO, READ BACK BY AND VERIFIED WITH: ELLIS,C @ 0425 ON [id number] BY POTEAT,S Performed at [facility] [address] [place], [place] [address]       Radiology Studies: Dg Abd Portable 1v  Result Date: 08/13/2018 CLINICAL DATA:  Small-bowel obstruction. EXAM: PORTABLE ABDOMEN - 1 VIEW COMPARISON:  Radiographs of August 12, 2018. FINDINGS: Grossly stable small bowel dilatation is noted centrally. Residual contrast is noted within the gastric fundus. Gastrostomy tube is unchanged in position. IMPRESSION: Grossly stable small bowel dilatation is noted concerning for distal small bowel obstruction. Electronically Signed   By: Marijo Conception M.D.   On: 08/13/2018 08:08   Dg Abd Portable 1v  Result Date: 08/12/2018 CLINICAL DATA:  Abdominal pain, abdominal distention. EXAM: PORTABLE ABDOMEN - 1 VIEW COMPARISON:  Radiograph of August 11, 2018. FINDINGS: Stable small bowel dilatation is noted concerning for distal small bowel obstruction. No colonic dilatation is noted. Residual contrast remains within the gastric fundus. Gastrostomy tube is again noted and unchanged. IMPRESSION: Stable small bowel dilatation is noted concerning for distal small bowel obstruction. Electronically Signed   By: Marijo Conception M.D.   On: 08/12/2018 12:01      LOS: 4 days   Time spent: More than 50% of that time was spent in counseling  and/or coordination of care.  Antonieta Pert, MD [facility]  08/14/2018, 10:00 AM

## 2018-08-14 NOTE — Progress Notes (Signed)
    CC:  Abdominal pain  Subjective: Having more pain this AM.  Also had extremely large bowel movement.  No n/v.  Upset about her regular neuropathy meds not being given.    Objective: Vital signs in last 24 hours: Temp:  [98 F (36.7 C)-98.6 F (37 C)] 98 F (36.7 C) (07/25 0413) Pulse Rate:  [78-89] 79 (07/25 0413) Resp:  [14-20] 18 (07/25 0413) BP: (139-171)/(79-94) 171/94 (07/25 0413) SpO2:  [94 %-100 %] 100 % (07/25 0413) FiO2 (%):  [28 %] 28 % (07/25 0413) Weight:  [47.2 kg] 47.2 kg (07/25 0429) Last BM Date: 08/14/18   Intake/Output from previous day: 07/24 0701 - 07/25 0700 In: 3769.9 [I.V.:3569.9; IV Piggyback:200] Out: 2300 [Urine:1200; Stool:1100] Intake/Output this shift: Total I/O In: 15 [P.O.:15] Out: 450 [Urine:450]  General appearance: alert, cooperative and mild distress GI: firm over rectus hematoma.   Green drainage from the PEG. MUCH less distended today.    Lab Results:  Recent Labs    08/13/18 0452 08/14/18 0910  WBC 8.5 8.9  HGB 9.5* 10.2*  HCT 33.3* 35.4*  PLT 452* 199    BMET Recent Labs    08/12/18 1038 08/13/18 0452  NA 142 143  K 3.7 3.7  CL 99 104  CO2 23 18*  GLUCOSE 119* 105*  BUN 18 17  CREATININE 0.69 0.61  CALCIUM 9.5 9.0   PT/INR No results for input(s): LABPROT, INR in the last 72 hours.  Recent Labs  Lab 08/10/18 1440 08/11/18 0038 08/12/18 1038  AST 18 13* 12*  ALT 16 13 14   ALKPHOS 157* 133* 131*  BILITOT 0.7 0.4 1.2  PROT 8.7* 7.8 7.6  ALBUMIN 3.4* 3.0* 3.2*     Lipase     Component Value Date/Time   LIPASE 19 08/10/2018 1440     Medications: . sodium chloride   Intravenous Once  . buPROPion  150 mg Oral Daily  . Chlorhexidine Gluconate Cloth  6 each Topical Q0600  . DULoxetine  30 mg Oral BID  . gabapentin  300 mg Oral TID  . insulin aspart  0-9 Units Subcutaneous Q4H  . mupirocin ointment  1 application Nasal BID  . umeclidinium bromide  1 puff Inhalation Daily  . zolpidem  2.5 mg  Oral QHS    Assessment/Plan Quadriplegia s/p trach and PEG and SP catheter DM Depression Chronic hypotension COPD Sacral decubitus ulcer Anemia - Hgb 8.1 (7/22), monitor UTI - on rocephin  SBO vs ileus from rectus sheath hematoma - CT scan 7/21 shows SBO, large left rectus sheath hematoma17.5 x 13.0 x 7.5 cm - Small bowel protocol started 7/22 and delayed film showed persistent SBO with contrast in the stomach Large BM.  Hope that we are making progress.    ID -rocephin 7/22>> FEN -IVF, NPO, G-tube to gravity VTE -SCDs, lovenox on hold due to rectus sheath hematoma Foley -chronic SP catheter Follow up -TBD  Neuro - give meds via G tube and clamp for short period of time.    Plan:  Continue PEG drainage, and conservative rx.  She is on Miralax at SNF, so constipation is not a normal issue with her.     LOS: 4 days    Stark Klein 08/14/2018 [phone]

## 2018-08-14 NOTE — Plan of Care (Signed)
Pt had a Lg BM this shift. Pt has been teary and frustrated this shift. It has been difficult to get her comfortable. Pt might benefit from some scheduled anti-anxiety meds, especially since she is NPO and unable to take her normals meds PO.

## 2018-08-15 ENCOUNTER — Inpatient Hospital Stay: Payer: Self-pay

## 2018-08-15 ENCOUNTER — Inpatient Hospital Stay (HOSPITAL_COMMUNITY): Payer: Medicare Other

## 2018-08-15 LAB — BASIC METABOLIC PANEL
Anion gap: 15 (ref 5–15)
BUN: 9 mg/dL (ref 6–20)
CO2: 14 mmol/L — ABNORMAL LOW (ref 22–32)
Calcium: 8.8 mg/dL — ABNORMAL LOW (ref 8.9–10.3)
Chloride: 112 mmol/L — ABNORMAL HIGH (ref 98–111)
Creatinine, Ser: 0.49 mg/dL (ref 0.44–1.00)
GFR calc Af Amer: >60 mL/min (ref >60)
GFR calc non Af Amer: >60 mL/min (ref >60)
Glucose, Bld: 63 mg/dL — ABNORMAL LOW (ref 70–99)
Potassium: 4.2 mmol/L (ref 3.5–5.1)
Sodium: 141 mmol/L (ref 135–145)

## 2018-08-15 LAB — GLUCOSE, CAPILLARY
Glucose-Capillary: 100 mg/dL — ABNORMAL HIGH (ref 70–99)
Glucose-Capillary: 103 mg/dL — ABNORMAL HIGH (ref 70–99)
Glucose-Capillary: 113 mg/dL — ABNORMAL HIGH (ref 70–99)
Glucose-Capillary: 121 mg/dL — ABNORMAL HIGH (ref 70–99)
Glucose-Capillary: 134 mg/dL — ABNORMAL HIGH (ref 70–99)
Glucose-Capillary: 65 mg/dL — ABNORMAL LOW (ref 70–99)
Glucose-Capillary: 76 mg/dL (ref 70–99)
Glucose-Capillary: 96 mg/dL (ref 70–99)

## 2018-08-15 LAB — CBC
HCT: 32 % — ABNORMAL LOW (ref 36.0–46.0)
Hemoglobin: 8.9 g/dL — ABNORMAL LOW (ref 12.0–15.0)
MCH: 27.6 pg (ref 26.0–34.0)
MCHC: 27.8 g/dL — ABNORMAL LOW (ref 30.0–36.0)
MCV: 99.1 fL (ref 80.0–100.0)
Platelets: 384 10*3/uL (ref 150–400)
RBC: 3.23 MIL/uL — ABNORMAL LOW (ref 3.87–5.11)
RDW: 16.4 % — ABNORMAL HIGH (ref 11.5–15.5)
WBC: 8.7 10*3/uL (ref 4.0–10.5)
nRBC: 0 % (ref 0.0–0.2)

## 2018-08-15 LAB — CULTURE, BLOOD (ROUTINE X 2)
Culture: NO GROWTH
Culture: NO GROWTH
Special Requests: ADEQUATE
Special Requests: ADEQUATE

## 2018-08-15 MED ORDER — TRAVASOL 10 % IV SOLN
INTRAVENOUS | Status: AC
  Filled 2018-08-15: qty 360

## 2018-08-15 MED ORDER — DEXTROSE-NACL 5-0.9 % IV SOLN: INTRAVENOUS | Status: DC

## 2018-08-15 MED ORDER — BACLOFEN 10 MG PO TABS
5.0000 mg | ORAL_TABLET | Freq: Three times a day (TID) | ORAL | Status: DC
  Administered 2018-08-15 – 2018-08-24 (×28): 5 mg via ORAL
  Filled 2018-08-15 (×28): qty 1

## 2018-08-15 MED ORDER — DEXTROSE-NACL 5-0.9 % IV SOLN: INTRAVENOUS | Status: AC

## 2018-08-15 MED ORDER — DEXTROSE 50 % IV SOLN
12.5000 g | Freq: Once | INTRAVENOUS | Status: AC
  Administered 2018-08-15: 12.5 g via INTRAVENOUS
  Filled 2018-08-15: qty 50

## 2018-08-15 MED ORDER — OXYCODONE HCL 5 MG PO TABS
5.0000 mg | ORAL_TABLET | ORAL | Status: DC | PRN
  Administered 2018-08-15 – 2018-08-24 (×17): 5 mg via ORAL
  Filled 2018-08-15 (×17): qty 1

## 2018-08-15 MED ORDER — DEXTROSE-NACL 5-0.9 % IV SOLN
INTRAVENOUS | Status: AC
  Administered 2018-08-15: 09:00:00 via INTRAVENOUS

## 2018-08-15 NOTE — Progress Notes (Signed)
    CC:  Abdominal pain  Subjective: Having more pain this AM.  Also had extremely large bowel movement.  No n/v.  Upset about her regular neuropathy meds not being given.    Objective: Vital signs in last 24 hours: Temp:  [97.7 F (36.5 C)-97.9 F (36.6 C)] 97.7 F (36.5 C) (07/25 2100) Pulse Rate:  [79-80] 79 (07/26 0504) Resp:  [12-18] 12 (07/26 0504) BP: (133-150)/(83-90) 133/90 (07/26 0504) SpO2:  [94 %-100 %] 96 % (07/26 0504) FiO2 (%):  [25 %-28 %] 28 % (07/26 0504) Weight:  [42.2 kg] 42.2 kg (07/26 0637) Last BM Date: 08/14/18   Intake/Output from previous day: 07/25 0701 - 07/26 0700 In: 922.2 [P.O.:75; I.V.:847.2] Out: 1750 [Urine:1450] Intake/Output this shift: No intake/output data recorded.  General appearance: alert, cooperative and mild distress GI: firm over rectus hematoma.   Green drainage from the PEG. Distended again today.  Lab Results:  Recent Labs    08/14/18 0910 08/15/18 0335  WBC 8.9 8.7  HGB 10.2* 8.9*  HCT 35.4* 32.0*  PLT 199 384    BMET Recent Labs    08/14/18 0910 08/15/18 0335  NA 145 141  K 4.0 4.2  CL 111 112*  CO2 16* 14*  GLUCOSE 108* 63*  BUN 12 9  CREATININE 0.59 0.49  CALCIUM 9.4 8.8*   PT/INR No results for input(s): LABPROT, INR in the last 72 hours.  Recent Labs  Lab 08/10/18 1440 08/11/18 0038 08/12/18 1038  AST 18 13* 12*  ALT 16 13 14   ALKPHOS 157* 133* 131*  BILITOT 0.7 0.4 1.2  PROT 8.7* 7.8 7.6  ALBUMIN 3.4* 3.0* 3.2*     Lipase     Component Value Date/Time   LIPASE 19 08/10/2018 1440     Medications: . sodium chloride   Intravenous Once  . buPROPion  150 mg Oral Daily  . Chlorhexidine Gluconate Cloth  6 each Topical Q0600  . DULoxetine  30 mg Oral BID  . gabapentin  300 mg Oral TID  . insulin aspart  0-9 Units Subcutaneous Q4H  . LORazepam  0.5 mg Intravenous Once  . mupirocin ointment  1 application Nasal BID  . sodium chloride flush  10-40 mL Intracatheter Q12H  .  umeclidinium bromide  1 puff Inhalation Daily  . zolpidem  2.5 mg Oral QHS    Assessment/Plan Quadriplegia s/p trach and PEG and SP catheter DM Depression Chronic hypotension COPD Sacral decubitus ulcer Anemia - Hgb 8.1 (7/22), monitor UTI - on rocephin  SBO vs ileus from rectus sheath hematoma - CT scan 7/21 shows SBO, large left rectus sheath hematoma17.5 x 13.0 x 7.5 cm - had another BM and some flatus, but more distended today.    ID -rocephin 7/22>> FEN -IVF, NPO, G-tube to gravity VTE -SCDs, lovenox on hold due to rectus sheath hematoma Foley -chronic SP catheter Follow up -TBD  Neuro - give meds via G tube and clamp for short period of time.    Plan:  Prealbumin and TNA.  Discussed that if no improvement, will need surgery.  She is on Miralax at SNF, so constipation is not a normal issue with her.     LOS: 5 days    Stark Klein 08/15/2018

## 2018-08-15 NOTE — Progress Notes (Signed)
PROGRESS NOTE   Angela Page  ZWC:[id number] DOB: [birth year] DOA: 08/10/2018 PCP: Default, Provider, MD   Brief Narrative: 48 y.o. female with medical history significant of traumatic C spine fracture resulting in quadriplegia s/p trach, peg, suprapubic catheter as well as COPD, chronic hypotension, T2DM, chronic pain, SSS s/p pacemaker, and chronic decubitus ulcers presenting with abdominal pain, worsening  With no vomiting until in ED.  No F, chills, CP, SOB.  Continues to have BM.  No known COVID exposures.   ED Course: Imaging concerning for SBO and rectus sheath hematoma.  Labs with anemia.  Surgery c/s.  Labs, imaging.  Abx for possible UTI. Patient was admitted, seen by surgery, being managed conservatively. Having bowel movement but continued t oc/o abdominal pain/distention  Subjective: Reports feeling little better.Hard large bowel movement.  Afebrile.  But reports after taking even ice chips or medications she feels nauseous. On trach collar, no fever, has some dry cough.  Assessment & Plan:  SBO vs ilues from rectus sheath hematoma: xray 7/24 with grossly stable small bowel dilatation, concerning for distal small bowel obstruction.  Having bowel movement.  Still with significant nausea after ice chips and meds.  Appreciate surgery input okay for p.o. meds and clamp G tube for meds.  If does not improve may need surgical intervention per surgery.  Continue current conservative management with IV fluid, nausea medications. TPN being started 7/26  Rectus sheath hematoma, Lovenox has been discontinued.  She reports she has been on prophylactic Lovenox for few months now. CT scan showed hematoma 17.5 x 13.0 x 7.5 cm.  Hemoglobin has improved to 9 g.  Monitor hemoglobin and transfuse as needed.  Doubt she is a candidate for anticoagulation on long-term.  Acute blood blood loss anemia secondary to hematoma.  Hemoglobin stable.  Received 2 units PRBC so far. Monitor Recent Labs   Lab 08/11/18 0038 08/12/18 1038 08/13/18 0452 08/14/18 0910 08/15/18 0335  HGB 8.1* 9.7* 9.5* 10.2* 8.9*  HCT 27.5* 32.0* 33.3* 35.4* 32.0*   Quadriplegia with trach and PEG and SP catheter in place secondary to traumatic C-spine fracture: Continue supportive care, trach collar, supplemental oxygen. Resume her baclofen 5 mg tid, gabapentin 300 mg tid. Cont nebs, incurse ellipta and pulm toiletting.  Hyperchloremic metabolic acidosis- d/c ivf once on TPN today. Bmp in am.  Lactic acid is stable previously.   UTI POA on Rocephin. Afebrile now. culture no growth so far,Urine culture multiple species present continue antibiotic for total 5 days.  Bed sore in buttock and sacrum.  Continue wound care.  Continue offloading,  T2DM: With hypoglycemia likely from n.p.o.  Change fluids to dextrose.  TPN being started.  Continue sliding scale insulin and monitor.     Recent Labs  Lab 08/14/18 2055 08/15/18 0022 08/15/18 0501 08/15/18 0632 08/15/18 0815  GLUCAP 90 76 65* 134* 103*   Depression/Insomnia:Mood is stable, when able to take p.o. resume cymbalta, wellbutrin  Chronic Hypotension:patient reports midodrine was d/c'd, continue to monitor off midodrine.  Can use as needed.  Pressure fairly stable.  Hx COPD:continue home meds/bronchodilators as above.  Elevated istat hcg: repeat serum HCG was 4 (ref <5).  DVT prophylaxis: SCD Code Status: Full code Family Communication: plan of care discussed with patient in detail. I had called and updated pt's mother previously. Disposition Plan: Remains inpatient pending clinical improvement   Consultants:  General surgery  Procedures: None  7/23:Abd portable  Stable small bowel dilatation is noted concerning for distal small bowel  obstruction.  AXR 7/24:  Grossly stable small bowel dilatation is noted concerning for distal small bowel obstruction.  CT abdomen/pelvis:08/10/2018  1.CT findings consistent with a fairly high-grade  small-bowel obstruction in the mid small bowel region in the central abdomen at the level of the iliac crest. This is most likely due to adhesions. 2.Very large left rectus sheath hematoma. 3.Feeding gastrostomy tube and suprapubic catheter is in good position without complicating features. 4.Sacral decubitus ulcer. 5.Marked age advanced vascular calcifications for age. 6.Lower pole left renal calculus but no obstructing ureteral Calculi.  Microbiology: Urine culture pending Antimicrobials: Anti-infectives (From admission, onward)   Start     Dose/Rate Route Frequency Ordered Stop   08/11/18 0200  cefTRIAXone (ROCEPHIN) 1 g in sodium chloride 0.9 % 100 mL IVPB     1 g 200 mL/hr over 30 Minutes Intravenous Every 24 hours 08/10/18 1921     08/10/18 1800  aztreonam (AZACTAM) 2 g in sodium chloride 0.9 % 100 mL IVPB     2 g 200 mL/hr over 30 Minutes Intravenous  Once 08/10/18 1756 08/10/18 2011       Objective: Vitals:   08/14/18 2313 08/15/18 0428 08/15/18 0504 08/15/18 0637  BP:   133/90   Pulse: 80 80 79   Resp: 18 16 12    Temp:      TempSrc:      SpO2: 100% 98% 96%   Weight:    42.2 kg  Height:        Intake/Output Summary (Last 24 hours) at 08/15/2018 1017 Last data filed at 08/14/2018 2324 Gross per 24 hour  Intake 907.16 ml  Output 1300 ml  Net -392.84 ml   Filed Weights   08/13/18 0703 08/14/18 0429 08/15/18 0637  Weight: 59 kg 47.2 kg 42.2 kg   Weight change: -16.8 kg  Body mass index is 17.01 kg/m.  Intake/Output from previous day: 07/25 0701 - 07/26 0700 In: 922.2 [P.O.:75; I.V.:847.2] Out: 1750 [Urine:1450] Intake/Output this shift: No intake/output data recorded.  Examination: General exam: AAO,NAD, weak/frail, older than stated age HEENT:Oral mucosa moist, Ear/Nose WNL grossly, dentition normal.  Trach collar present Respiratory system: Diminished at the base,no wheezing or crackles, no use of accessory muscle Cardiovascular system: S1 & S2 +, No  JVD, regular RR. Gastrointestinal system: Abdomen soft, NT,ND, BS+. PS tube+, g TUBE +  w greenish output Nervous System:Alert, awake, quadriplegia, contractures present Extremities: No edema, distal peripheral pulses palpable.  Contractures present Skin: No rashes,no icterus. Bed sore in buttock and sacrum.   Medications:  Scheduled Meds: . sodium chloride   Intravenous Once  . baclofen  5 mg Oral TID  . buPROPion  150 mg Oral Daily  . Chlorhexidine Gluconate Cloth  6 each Topical Q0600  . DULoxetine  30 mg Oral BID  . gabapentin  300 mg Oral TID  . insulin aspart  0-9 Units Subcutaneous Q4H  . LORazepam  0.5 mg Intravenous Once  . mupirocin ointment  1 application Nasal BID  . sodium chloride flush  10-40 mL Intracatheter Q12H  . umeclidinium bromide  1 puff Inhalation Daily  . zolpidem  2.5 mg Oral QHS   Continuous Infusions: . cefTRIAXone (ROCEPHIN)  IV 1 g (08/15/18 0233)  . dextrose 5 % and 0.9% NaCl 75 mL/hr at 08/15/18 0855  . dextrose 5 % and 0.9% NaCl    . TPN ADULT (ION)      Data Reviewed: I have personally reviewed following labs and imaging studies  CBC:  Recent Labs  Lab 08/10/18 1440 08/11/18 0038 08/12/18 1038 08/13/18 0452 08/14/18 0910 08/15/18 0335  WBC 13.3* 12.4* 10.3 8.5 8.9 8.7  NEUTROABS 11.4*  --   --   --   --   --   HGB 7.1* 8.1* 9.7* 9.5* 10.2* 8.9*  HCT 23.7* 27.5* 32.0* 33.3* 35.4* 32.0*  MCV 91.5 92.0 92.8 97.1 97.8 99.1  PLT 470* 457* 441* 452* 199 412   Basic Metabolic Panel: Recent Labs  Lab 08/11/18 0038 08/12/18 1038 08/13/18 0452 08/14/18 0910 08/15/18 0335  NA 136 142 143 145 141  K 3.8 3.7 3.7 4.0 4.2  CL 99 99 104 111 112*  CO2 24 23 18* 16* 14*  GLUCOSE 145* 119* 105* 108* 63*  BUN 14 18 17 12 9   CREATININE 0.55 0.69 0.61 0.59 0.49  CALCIUM 8.9 9.5 9.0 9.4 8.8*   GFR: Estimated Creatinine Clearance: 57.9 mL/min (by C-G formula based on SCr of 0.49 mg/dL). Liver Function Tests: Recent Labs  Lab 08/10/18 1440  08/11/18 0038 08/12/18 1038  AST 18 13* 12*  ALT 16 13 14   ALKPHOS 157* 133* 131*  BILITOT 0.7 0.4 1.2  PROT 8.7* 7.8 7.6  ALBUMIN 3.4* 3.0* 3.2*   Recent Labs  Lab 08/10/18 1440  LIPASE 19   No results for input(s): AMMONIA in the last 168 hours. Coagulation Profile: No results for input(s): INR, PROTIME in the last 168 hours. Cardiac Enzymes: No results for input(s): CKTOTAL, CKMB, CKMBINDEX, TROPONINI in the last 168 hours. BNP (last 3 results) No results for input(s): PROBNP in the last 8760 hours. HbA1C: No results for input(s): HGBA1C in the last 72 hours. CBG: Recent Labs  Lab 08/14/18 2055 08/15/18 0022 08/15/18 0501 08/15/18 0632 08/15/18 0815  GLUCAP 90 76 65* 134* 103*   Lipid Profile: No results for input(s): CHOL, HDL, LDLCALC, TRIG, CHOLHDL, LDLDIRECT in the last 72 hours. Thyroid Function Tests: No results for input(s): TSH, T4TOTAL, FREET4, T3FREE, THYROIDAB in the last 72 hours. Anemia Panel: No results for input(s): VITAMINB12, FOLATE, FERRITIN, TIBC, IRON, RETICCTPCT in the last 72 hours. Sepsis Labs: Recent Labs  Lab 08/10/18 1448 08/11/18 0038 08/12/18 1413  LATICACIDVEN 1.1 0.9 0.8    Recent Results (from the past 240 hour(s))  Urine culture     Status: Abnormal   Collection Time: 08/10/18  2:06 PM   Specimen: Urine, Catheterized  Result Value Ref Range Status   Specimen Description   Final    Urine Performed at [facility] [address], [place], [place] [address]    Special Requests   Final    NONE Performed at [facility], [place] [address], [place], [place] [address]    Culture MULTIPLE SPECIES PRESENT, SUGGEST RECOLLECTION (A)  Final   Report Status 08/12/2018 FINAL  Final  Culture, blood (routine x 2)     Status: None   Collection Time: 08/10/18  2:42 PM   Specimen: BLOOD  Result Value Ref Range Status   Specimen Description   Final    BLOOD RIGHT ARM Performed at [facility] [address] [place], [place] [address]    Special Requests   Final    BOTTLES DRAWN AEROBIC AND ANAEROBIC Blood Culture adequate volume Performed at [facility] [address], [place], [place] [address]    Culture   Final    NO GROWTH 5 DAYS Performed at [facility], [place],  [place] [address]    Report Status 08/15/2018 FINAL  Final  Culture, blood (routine x 2)     Status: None   Collection Time: 08/10/18  3:29 PM   Specimen: BLOOD LEFT HAND  Result Value Ref Range Status   Specimen Description   Final    BLOOD LEFT HAND Performed at [facility] [address], [place], [place] [address]    Special Requests   Final    BOTTLES DRAWN AEROBIC AND ANAEROBIC Blood Culture adequate volume Performed at [facility] [address] [place], [place] [address]    Culture   Final    NO GROWTH 5 DAYS Performed at [facility], [place] [address], [place], [place] [address]    Report Status 08/15/2018 FINAL  Final  SARS Coronavirus 2 (CEPHEID - Performed in [facility] hospital lab), Hosp Order     Status: None   Collection Time: 08/10/18  6:20 PM   Specimen: Nasopharyngeal Swab  Result Value Ref Range Status   SARS Coronavirus 2 NEGATIVE NEGATIVE Final    Comment: (NOTE) If result is NEGATIVE SARS-CoV-2 target nucleic acids are NOT DETECTED. The SARS-CoV-2 RNA is generally detectable in upper and lower  respiratory specimens during the acute phase of infection. The lowest  concentration of SARS-CoV-2 viral copies this assay can detect is 250  copies / mL. A negative result does not preclude SARS-CoV-2 infection  and should not be used as the sole basis for treatment or other  patient management decisions.  A negative result may occur with  improper specimen collection / handling, submission of specimen other  than nasopharyngeal swab, presence of viral mutation(s) within  the  areas targeted by this assay, and inadequate number of viral copies  (<250 copies / mL). A negative result must be combined with clinical  observations, patient history, and epidemiological information. If result is POSITIVE SARS-CoV-2 target nucleic acids are DETECTED. The SARS-CoV-2 RNA is generally detectable in upper and lower  respiratory specimens dur ing the acute phase of infection.  Positive  results are indicative of active infection with SARS-CoV-2.  Clinical  correlation with patient history and other diagnostic information is  necessary to determine patient infection status.  Positive results do  not rule out bacterial infection or co-infection with other viruses. If result is PRESUMPTIVE POSTIVE SARS-CoV-2 nucleic acids MAY BE PRESENT.   A presumptive positive result was obtained on the submitted specimen  and confirmed on repeat testing.  While 2019 novel coronavirus  (SARS-CoV-2) nucleic acids may be present in the submitted sample  additional confirmatory testing may be necessary for epidemiological  and / or clinical management purposes  to differentiate between  SARS-CoV-2 and other Sarbecovirus currently known to infect humans.  If clinically indicated additional testing with an alternate test  methodology [phone]) is advised. The SARS-CoV-2 RNA is generally  detectable in upper and lower respiratory sp ecimens during the acute  phase of infection. The expected result is Negative. Fact Sheet for Patients:  StrictlyIdeas.no Fact Sheet for Healthcare Providers: BankingDealers.co.za This test is not yet approved or cleared by the [place] FDA and has been authorized for detection and/or diagnosis of SARS-CoV-2 by FDA under an Emergency Use Authorization (EUA).  This EUA will remain in effect (meaning this test can be used) for the duration of the COVID-19 declaration under Section 564(b)(1) of the Act, 21  U.S.C. section 360bbb-3(b)(1), unless the authorization is terminated or revoked sooner. Performed at [facility]  [place] [address], [place], [place] [address]   MRSA PCR Screening     Status: Abnormal   Collection Time: 08/11/18  1:42 AM   Specimen: Nasal Mucosa; Nasopharyngeal  Result Value Ref Range Status   MRSA by PCR POSITIVE (A) NEGATIVE Final    Comment:        The GeneXpert MRSA Assay (FDA approved for NASAL specimens only), is one component of a comprehensive MRSA colonization surveillance program. It is not intended to diagnose MRSA infection nor to guide or monitor treatment for MRSA infections. RESULT CALLED TO, READ BACK BY AND VERIFIED WITH: ELLIS,C @ 0425 ON [id number] BY POTEAT,S Performed at [facility] [address], [place], [place] [address]       Radiology Studies: Dg Abd Portable 1v  Result Date: 08/15/2018 CLINICAL DATA:  48 year old female with history of small-bowel obstruction. EXAM: PORTABLE ABDOMEN - 1 VIEW COMPARISON:  Abdominal radiograph 08/13/2018. FINDINGS: Percutaneous gastrostomy tube with retention balloon projecting over the epigastric region. Contrast in the stomach. Severe dilatation of small bowel loops which are gas-filled projecting over the mid abdomen measuring up to 4.6 cm in diameter. Paucity of colonic and rectal gas. No gross evidence of pneumoperitoneum. Pacemaker leads projecting over the lower thorax. Surgical clips projecting over the right lower abdomen. IMPRESSION: 1. The appearance of the abdomen remains compatible with small bowel obstruction. 2. Postoperative changes and support apparatus, as above. Electronically Signed   By: Vinnie Langton M.D.   On: 08/15/2018 06:24   [name] Ekg Site Rite  Result Date: 08/15/2018 If Site Rite image not attached, placement could not be confirmed due to current cardiac rhythm.     LOS: 5 days   Time spent: More than 50% of that time was spent in  counseling and/or coordination of care.  Antonieta Pert, MD [facility]  08/15/2018, 10:17 AM

## 2018-08-15 NOTE — Progress Notes (Signed)
Spoke with Dr Maren Beach re PICC to be placed by IR per FYI.  VAS Team PICC order d/c'ed.

## 2018-08-15 NOTE — Progress Notes (Signed)
PHARMACY - ADULT TOTAL PARENTERAL NUTRITION CONSULT NOTE   Pharmacy Consult for TPN Indication:  SBO vs ileus from rectus sheath hematoma  HPI:  48 y/o F with hx of quadriplegia from traumatic C spine fracture admitted 08/10/18 with large rectus sheath hematoma and SBO vs ileus, which has persisted. Orders received 08/15/18 to begin TPN with pharmacy to assist.     TPN Access:      PICC ordered by MD TPN start date:  08/15/18, or as soon as central access in place   Patient Measurements: Estimated body mass index is 17.01 kg/m as calculated from the following:   Height as of this encounter: '5\' 2"'$  (1.575 m).   Weight as of this encounter: 93 lb (42.2 kg).  Intake/Output: Intake/Output Summary (Last 24 hours) at 08/15/2018 0954 Last data filed at 08/14/2018 2324 Gross per 24 hour  Intake 907.16 ml  Output 1300 ml  Net -392.84 ml     Estimated Nutritional Needs -  *RD recommendations are pending * kCal: Protein:  Fluid:   Current Nutrition:  NPO and currently off her usual tube feedings  IVF: D-5-NS at 75 mL/hr  Insulin Requirements: PTA regimen for DM2 was reportedly Basaglar 5 units q hs + Novolog SSI R1V along with Trulicity 1.5 mg SQ weekly.  As inpatient has been receiving only Novolog SSI sensitive scale q4h.   Recent Labs    08/12/18 1038  08/14/18 0910 08/15/18 0335  NA 142   < > 145 141  K 3.7   < > 4.0 4.2  CL 99   < > 111 112*  CO2 23   < > 16* 14*  GLUCOSE 119*   < > 108* 63*  BUN 18   < > 12 9  CREATININE 0.69   < > 0.59 0.49  CALCIUM 9.5   < > 9.4 8.8*  ALBUMIN 3.2*  --   --   --   ALKPHOS 131*  --   --   --   AST 12*  --   --   --   ALT 14  --   --   --   BILITOT 1.2  --   --   --    < > = values in this interval not displayed.     ASSESSMENT                                                                                                           Glucose - low this AM on SSI Novolog sensitive scale; mIVF was subsequently changed and now  contains dextrose  Electrolytes - Cl elevated, CO2 low  Renal - SCr and BUN WNL  LFTs -  All below ULN except Alk Phos 131 (<1.5x ULN) when last checked (7/23)  TGs - ordered for tomorrow  Prealbumin - ordered for tomorrow   PLAN  At 1800 today:  Start TPN (AA 50 g/L, dextrose 15%, lipids 30 g/L; chloride:acetate 1:2) at 30 ml/hr.  TPN to contain multivitamins daily, trace elements M-W-F  Reduce IVF to 45 ml/hr.  For now continue CBG q4h with sensitive-scale SSI coverage Will calculate TPN goal rate and formula after RD evaluation is completed.  TPN lab panels on Mondays & Thursdays. F/u daily.   Clayburn Pert, PharmD, BCPS [phone] 08/15/2018  10:04 AM

## 2018-08-16 ENCOUNTER — Inpatient Hospital Stay (HOSPITAL_COMMUNITY): Payer: Medicare Other

## 2018-08-16 LAB — CBC
HCT: 30.9 % — ABNORMAL LOW (ref 36.0–46.0)
Hemoglobin: 9.2 g/dL — ABNORMAL LOW (ref 12.0–15.0)
MCH: 28.2 pg (ref 26.0–34.0)
MCHC: 29.8 g/dL — ABNORMAL LOW (ref 30.0–36.0)
MCV: 94.8 fL (ref 80.0–100.0)
Platelets: 337 10*3/uL (ref 150–400)
RBC: 3.26 MIL/uL — ABNORMAL LOW (ref 3.87–5.11)
RDW: 16.3 % — ABNORMAL HIGH (ref 11.5–15.5)
WBC: 7.5 10*3/uL (ref 4.0–10.5)
nRBC: 0 % (ref 0.0–0.2)

## 2018-08-16 LAB — COMPREHENSIVE METABOLIC PANEL
ALT: 10 U/L (ref 0–44)
AST: 10 U/L — ABNORMAL LOW (ref 15–41)
Albumin: 3 g/dL — ABNORMAL LOW (ref 3.5–5.0)
Alkaline Phosphatase: 79 U/L (ref 38–126)
Anion gap: 12 (ref 5–15)
BUN: 6 mg/dL (ref 6–20)
CO2: 23 mmol/L (ref 22–32)
Calcium: 8.8 mg/dL — ABNORMAL LOW (ref 8.9–10.3)
Chloride: 108 mmol/L (ref 98–111)
Creatinine, Ser: 0.37 mg/dL — ABNORMAL LOW (ref 0.44–1.00)
GFR calc Af Amer: >60 mL/min (ref >60)
GFR calc non Af Amer: >60 mL/min (ref >60)
Glucose, Bld: 102 mg/dL — ABNORMAL HIGH (ref 70–99)
Potassium: 2.8 mmol/L — ABNORMAL LOW (ref 3.5–5.1)
Sodium: 143 mmol/L (ref 135–145)
Total Bilirubin: 0.8 mg/dL (ref 0.3–1.2)
Total Protein: 6.8 g/dL (ref 6.5–8.1)

## 2018-08-16 LAB — DIFFERENTIAL
Abs Immature Granulocytes: 0.03 10*3/uL (ref 0.00–0.07)
Basophils Absolute: 0 10*3/uL (ref 0.0–0.1)
Basophils Relative: 0 %
Eosinophils Absolute: 0.4 10*3/uL (ref 0.0–0.5)
Eosinophils Relative: 5 %
Immature Granulocytes: 0 %
Lymphocytes Relative: 16 %
Lymphs Abs: 1.2 10*3/uL (ref 0.7–4.0)
Monocytes Absolute: 0.4 10*3/uL (ref 0.1–1.0)
Monocytes Relative: 6 %
Neutro Abs: 5.5 10*3/uL (ref 1.7–7.7)
Neutrophils Relative %: 73 %

## 2018-08-16 LAB — MAGNESIUM: Magnesium: 1.7 mg/dL (ref 1.7–2.4)

## 2018-08-16 LAB — PREALBUMIN: Prealbumin: 14.2 mg/dL — ABNORMAL LOW (ref 18–38)

## 2018-08-16 LAB — GLUCOSE, CAPILLARY
Glucose-Capillary: 104 mg/dL — ABNORMAL HIGH (ref 70–99)
Glucose-Capillary: 108 mg/dL — ABNORMAL HIGH (ref 70–99)
Glucose-Capillary: 129 mg/dL — ABNORMAL HIGH (ref 70–99)
Glucose-Capillary: 134 mg/dL — ABNORMAL HIGH (ref 70–99)
Glucose-Capillary: 86 mg/dL (ref 70–99)
Glucose-Capillary: 91 mg/dL (ref 70–99)

## 2018-08-16 LAB — TRIGLYCERIDES: Triglycerides: 172 mg/dL — ABNORMAL HIGH (ref <150)

## 2018-08-16 LAB — PHOSPHORUS: Phosphorus: 2.6 mg/dL (ref 2.5–4.6)

## 2018-08-16 MED ORDER — LIDOCAINE HCL 1 % IJ SOLN
INTRAMUSCULAR | Status: DC | PRN
  Administered 2018-08-16: 5 mL

## 2018-08-16 MED ORDER — DEXTROSE-NACL 5-0.9 % IV SOLN
INTRAVENOUS | Status: DC
  Administered 2018-08-16 – 2018-08-19 (×3): via INTRAVENOUS

## 2018-08-16 MED ORDER — TRAVASOL 10 % IV SOLN
INTRAVENOUS | Status: AC
  Administered 2018-08-16: 18:00:00 via INTRAVENOUS
  Filled 2018-08-16: qty 360

## 2018-08-16 MED ORDER — POTASSIUM CHLORIDE 10 MEQ/100ML IV SOLN
10.0000 meq | INTRAVENOUS | Status: AC
  Administered 2018-08-16 (×6): 10 meq via INTRAVENOUS
  Filled 2018-08-16 (×6): qty 100

## 2018-08-16 MED ORDER — LIDOCAINE HCL 1 % IJ SOLN
INTRAMUSCULAR | Status: AC
  Filled 2018-08-16: qty 20

## 2018-08-16 NOTE — Progress Notes (Signed)
PHARMACY - ADULT TOTAL PARENTERAL NUTRITION CONSULT NOTE   Pharmacy Consult for TPN Indication:  SBO vs ileus from rectus sheath hematoma  HPI:  48 y/o F with hx of quadriplegia from traumatic C spine fracture admitted 08/10/18 with large rectus sheath hematoma and SBO vs ileus, which has persisted. Orders received 08/15/18 to begin TPN with pharmacy to assist.    Significant Events:  7/26 VAS Team was unable to place PICC due to patient-specific factors (see chart note).  They report IR will need to place PICC and that the attending MD was notified of this.  TPN Access:      None at present TPN start date:  As soon as central access placed   Patient Measurements: Estimated body mass index is 18.29 kg/m as calculated from the following:   Height as of this encounter: 5\' 2"  (1.575 m).   Weight as of this encounter: 100 lb (45.4 kg).  Intake/Output:  Intake/Output Summary (Last 24 hours) at 08/16/2018 0949 Last data filed at 08/16/2018 0600 Gross per 24 hour  Intake 1298.69 ml  Output 2275 ml  Net -976.31 ml     Estimated Nutritional Needs -  *RD recommendations are pending *  KCal:     ~ 1500 / day Protein:  ~ 70 g / day  A TPN formula containing AA 50 g/L, dextrose 16%, lipids 30 g/L infusing at a goal rate of 60 mL/hr would deliver ~ 1500 KCal / day, 72 g protein / day     Current Nutrition:  NPO and currently off her usual tube feedings. TPN was not started last night due to lack of central access  IVF: D-5-NS at 50 mL/hr  Insulin Requirements: PTA regimen for DM2 was reportedly Basaglar 5 units q hs + Novolog SSI H8E along with Trulicity 1.5 mg SQ weekly.  As inpatient has been receiving only Novolog SSI sensitive scale q4h.   Recent Labs    08/15/18 0335 08/16/18 0424  NA 141 143  K 4.2 2.8*  CL 112* 108  CO2 14* 23  GLUCOSE 63* 102*  BUN 9 6  CREATININE 0.49 0.37*  CALCIUM 8.8* 8.8*  PHOS  --  2.6  MG  --  1.7  ALBUMIN  --  3.0*  ALKPHOS  --  79  AST   --  10*  ALT  --  10  BILITOT  --  0.8  TRIG  --  172*  PREALBUMIN  --  14.2*     ASSESSMENT                                                                                                           Glucose - CBGs 104 -134 on sensitive-scale Novolog SSI q4h  Electrolytes - K low (TRH coverage ordered KCl runs); Ca corrects to WNL  Renal - SCr low, BUN WNL  LFTs -  All below ULN   TGs - slightly elevated at baseline (172)  Prealbumin - 14.2 at baseline    PLAN  KCl runs ordered by TRH night coverage are in progress (10 mEq IV q1h x 6) Await word on timing of PICC placement by IR At 1800 today if PICC is in place:  Start TPN (AA 50 g/L, dextrose 16%, lipids 30 g/L) at 30 ml/hr.  TPN to contain multivitamins daily, trace elements M-W-F  Reduce IVF to 20 ml/hr. For now continue CBG q4h with sensitive-scale SSI coverage BMet, Mg, Phos tomorrow TPN lab panels on Mondays & Thursdays. F/u daily.   Clayburn Pert, PharmD, BCPS [phone] 08/16/2018  9:49 AM

## 2018-08-16 NOTE — Progress Notes (Signed)
CRITICAL VALUE ALERT  Critical Value:  K 2.8  Date & Time Notied:  08/16/18 0600  Provider Notified: Kennon Holter, NP  Orders Received/Actions taken: pending

## 2018-08-16 NOTE — Care Management Important Message (Signed)
Important Message  Patient Details IM Letter given to Dessa Phi RN to present to the Patient Name: Angela Page MRN: [id number] Date of Birth: 08-07-1970   Medicare Important Message Given:  Yes     Kerin Salen 08/16/2018, 11:41 AM

## 2018-08-16 NOTE — Progress Notes (Signed)
Initial Nutrition Assessment  DOCUMENTATION CODES:   Non-severe (moderate) malnutrition in context of chronic illness, Underweight  INTERVENTION:  - TPN per Pharmacy. - may require slow advancement if she continues to be hypokalemic.    NUTRITION DIAGNOSIS:   Moderate Malnutrition related to chronic illness as evidenced by moderate fat depletion, moderate muscle depletion, severe muscle depletion.  GOAL:   Patient will meet greater than or equal to 90% of their needs  MONITOR:   Labs, Weight trends, Skin, I & O's, Other (Comment)(TPN regimen)  REASON FOR ASSESSMENT:   Consult New TPN/TNA  ASSESSMENT:   48 y.o. female with medical history significant of traumatic cervical spine fracture resulting in quadriplegia s/p trach, PEG, suprapubic catheter; COPD, chronic hypotension, type 2 DM, chronic pain, SSS s/p pacemaker, and chronic decubitus ulcers. She presented to the ED on 7/21 reporting abdominal pain which began the night before when her TF was started. Pain worsened and she developed nausea and abdominal distention. She denied any episodes of vomiting PTA and that she continued to have BMs.  Current weight is 100 lb. Weight on admission (7/22) was 105 lb. Patient is well known to this RD from previous admissions. She resides at a nursing home and receives TF over 20 hours: Glucerna 1.2 @ 57 ml/hr x 20 hours (1400-1000) with Promod TID, which provides 1668 kcals and 98 grams protein. Patient was doing well with feedings until the night PTA when symptoms began.  PICC was placed yesterday and she was started on custom TPN @ 30 ml/hr. Able to communicate with Pharmacist earlier this AM about patient.   Surgery is following. No surgery planned at this time but may be required if patient does not improve. Diagnosis of SBO vs ileus from rectus sheath hematoma as seen on CT abdomen on 7/21. She has been NPO/without TF since admission.    Labs reviewed; CBGs: 86 and 104 mg/dl today,  K: 2.8 mmol/l, creatinine: 0.37 mg/dl. Mg and Phos are WDL.  Medications reviewed; sliding scale novolog, 10 mEq IV KCl x6 runs 7/27. IVF; D5-NS @ 50 ml/hr (204 kcal).    NUTRITION - FOCUSED PHYSICAL EXAM:    Most Recent Value  Orbital Region  Moderate depletion  Upper Arm Region  Moderate depletion  Thoracic and Lumbar Region  Unable to assess  Buccal Region  No depletion  Temple Region  Mild depletion  Clavicle Bone Region  Moderate depletion  Clavicle and Acromion Bone Region  Moderate depletion  Scapular Bone Region  No depletion  Dorsal Hand  Mild depletion  Patellar Region  Moderate depletion  Anterior Thigh Region  Severe depletion  Posterior Calf Region  Severe depletion  Edema (RD Assessment)  None  Hair  Reviewed  Eyes  Reviewed  Mouth  Reviewed  Skin  Reviewed  Nails  Reviewed       Diet Order:   Diet Order            Diet NPO time specified Except for: Ice Chips, Sips with Meds  Diet effective now              EDUCATION NEEDS:   No education needs have been identified at this time  Skin:  Skin Integrity Issues:: Other (Comment) Stage III: L IT Stage IV: sacrum  (per WOC note: large wound with three small wounds embedded within; red, nongranulating) Other: pin point wound (per WOC) to R IT  Last BM:  7/25  Height:   Ht Readings from Last 1 Encounters:  08/11/18 5\' 2"  (1.575 m)    Weight:   Wt Readings from Last 1 Encounters:  08/16/18 45.4 kg    Ideal Body Weight:  45 kg  BMI:  Body mass index is 18.29 kg/m.  Estimated Nutritional Needs:   Kcal:  1590-1815 kcal  Protein:  90-115 grams (2-2.5 grams)  Fluid:  >/= 1.7 L/day     Jarome Matin, MS, RD, LDN, [facility] Inpatient Clinical Dietitian Pager # [phone] After hours/weekend pager # [phone]

## 2018-08-16 NOTE — Progress Notes (Signed)
PROGRESS NOTE   Angela Page  ZOX:[id number] DOB: October 03, 1970 DOA: 08/10/2018 PCP: Default, Provider, MD   Brief Narrative: 48 y.o. female with medical history significant of traumatic C spine fracture resulting in quadriplegia s/p trach, peg, suprapubic catheter as well as COPD, chronic hypotension, T2DM, chronic pain, SSS s/p pacemaker, and chronic decubitus ulcers presenting with abdominal pain, worsening  With no vomiting until in ED.  No F, chills, CP, SOB.  Continues to have BM.  No known COVID exposures.   ED Course: Imaging concerning for SBO and rectus sheath hematoma.  Labs with anemia.  Surgery c/s.  Labs, imaging.  Abx for possible UTI. Patient was admitted, seen by surgery, being managed conservatively. Having bowel movement over weekend but continued to c/o abdominal pain/distention.   Subjective: Reports feeling somewhat better today, not sure if she is passing flatus today.No bowel movement last p.m. or  Today AM  Assessment & Plan:  SBO vs ilues from rectus sheath hematoma: CT scan 7/21 showed SBO, large rectus sheath hematoma 17.5x13x7.5 cm .  X-ray with persistent dilated SB loops.  Followed by general surgery, remains n.p.o. on IV fluids.  G-tube to drain and is being clamped postmedication.  If does not improve may need surgical intervention and surgery on board appreciate input.  Will replete her hypokalemia TPN being started 7/27- PICC line ordered per IR (per IV team)  Rectus sheath hematoma, Lovenox has been discontinued.  She reports she has been on prophylactic Lovenox for few months now. CT scan showed hematoma 17.5 x 13.0 x 7.5 cm.  Hemoglobin has improved to 9 g.  Monitor hemoglobin and transfuse as needed.  Doubt she is a candidate for anticoagulation on long-term.  Acute blood blood loss anemia secondary to hematoma.  Hemoglobin stable.  Received 2 units PRBC so far. Monitor Recent Labs  Lab 08/12/18 1038 08/13/18 0452 08/14/18 0910 08/15/18 0335  08/16/18 0424  HGB 9.7* 9.5* 10.2* 8.9* 9.2*  HCT 32.0* 33.3* 35.4* 32.0* 30.9*   Quadriplegia with trach and PEG and SP catheter in place secondary to traumatic C-spine fracture: Continue supportive care, trach collar, supplemental oxygen. Cont baclofen 5 mg tid, gabapentin 300 mg tid, nebs, incurse ellipta and pulm toiletting.  Hyperchloremic metabolic acidosis-improved.  Monitor.    UTI POA on Rocephin. Afebrile now. culture no growth so far,Urine culture multiple species present completing 5 days.   Bed sore in buttock and sacrum.  Continue wound care.  Continue offloading,  T2DM: With hypoglycemia likely from n.p.o.  on dextrose ivf.  D/c once on TPN. Continue sliding scale insulin and monitor.     Recent Labs  Lab 08/15/18 1702 08/15/18 2006 08/15/18 2345 08/16/18 0459 08/16/18 0731  GLUCAP 100* 96 113* 86 104*   Depression/Insomnia:Mood is stable, when able to take p.o. resume cymbalta, wellbutrin  Chronic Hypotension:patient reports midodrine was d/c'd, continue to monitor off midodrine.  Can use as needed.  Pressure fairly stable.  Hx COPD:continue home meds/bronchodilators as above.  Elevated istat hcg: repeat serum HCG was 4 (ref <5).  DVT prophylaxis: SCD Code Status: Full code Family Communication: plan of care discussed with patient in detail. I had called and updated pt's mother previously.  Patient has no questions. Disposition Plan: Remains inpatient pending clinical improvement . discussed with the surgeon.  Consultants:  General surgery  Procedures: None  7/23:Abd portable  Stable small bowel dilatation is noted concerning for distal small bowel obstruction.  AXR 7/24:  Grossly stable small bowel dilatation is noted  concerning for distal small bowel obstruction.  CT abdomen/pelvis:08/10/2018  1.CT findings consistent with a fairly high-grade small-bowel obstruction in the mid small bowel region in the central abdomen at the level of the iliac  crest. This is most likely due to adhesions. 2.Very large left rectus sheath hematoma. 3.Feeding gastrostomy tube and suprapubic catheter is in good position without complicating features. 4.Sacral decubitus ulcer. 5.Marked age advanced vascular calcifications for age. 6.Lower pole left renal calculus but no obstructing ureteral Calculi.  Microbiology: Urine culture pending Antimicrobials: Anti-infectives (From admission, onward)   Start     Dose/Rate Route Frequency Ordered Stop   08/11/18 0200  cefTRIAXone (ROCEPHIN) 1 g in sodium chloride 0.9 % 100 mL IVPB     1 g 200 mL/hr over 30 Minutes Intravenous Every 24 hours 08/10/18 1921     08/10/18 1800  aztreonam (AZACTAM) 2 g in sodium chloride 0.9 % 100 mL IVPB     2 g 200 mL/hr over 30 Minutes Intravenous  Once 08/10/18 1756 08/10/18 2011       Objective: Vitals:   08/15/18 2011 08/16/18 0051 08/16/18 0440 08/16/18 0503  BP: (!) 151/89   (!) 157/91  Pulse: 79 80 80 79  Resp: 18 16 18 17   Temp: 97.7 F (36.5 C)   97.8 F (36.6 C)  TempSrc: Oral   Oral  SpO2: 100% 98% 97% 100%  Weight:    45.4 kg  Height:        Intake/Output Summary (Last 24 hours) at 08/16/2018 1301 Last data filed at 08/16/2018 1135 Gross per 24 hour  Intake 1743.8 ml  Output 2975 ml  Net -1231.2 ml   Filed Weights   08/14/18 0429 08/15/18 0637 08/16/18 0503  Weight: 47.2 kg 42.2 kg 45.4 kg   Weight change: 3.175 kg  Body mass index is 18.29 kg/m.  Intake/Output from previous day: 07/26 0701 - 07/27 0700 In: 1298.7 [P.O.:60; I.V.:1038.7; IV Piggyback:200] Out: 2275 [Urine:925; Drains:1000] Intake/Output this shift: Total I/O In: 445.1 [P.O.:60; I.V.:150; IV Piggyback:235.1] Out: 700 [Urine:150; Drains:550]  Examination: General exam: Alert awake oriented, thin frail and older than stated age.   HEENT:Oral mucosa moist, Ear/Nose WNL grossly, dentition normal.  Trach collar present  Respiratory system: Diminished at the base,no wheezing  or crackles, no use of accessory muscle Cardiovascular system: S1 & S2 +, No JVD, regular RR. Gastrointestinal system: Abdomen firm, mildly tender bowel sounds sluggish G tube+ w greenish output to drain bag. SP tube+ Nervous System:Alert, awake, quadriplegia, contractures present Extremities: No edema, distal peripheral pulses palpable.  Contractures present Skin: No rashes,no icterus. Bed sore in buttock and sacrum.   Medications:  Scheduled Meds: . sodium chloride   Intravenous Once  . baclofen  5 mg Oral TID  . buPROPion  150 mg Oral Daily  . DULoxetine  30 mg Oral BID  . gabapentin  300 mg Oral TID  . insulin aspart  0-9 Units Subcutaneous Q4H  . LORazepam  0.5 mg Intravenous Once  . sodium chloride flush  10-40 mL Intracatheter Q12H  . umeclidinium bromide  1 puff Inhalation Daily  . zolpidem  2.5 mg Oral QHS   Continuous Infusions: . cefTRIAXone (ROCEPHIN)  IV 1 g (08/16/18 0314)  . dextrose 5 % and 0.9% NaCl    . potassium chloride 10 mEq (08/16/18 1252)  . TPN ADULT (ION)    . TPN ADULT (ION)      Data Reviewed: I have personally reviewed following labs and imaging studies  CBC: Recent Labs  Lab 08/10/18 1440  08/12/18 1038 08/13/18 0452 08/14/18 0910 08/15/18 0335 08/16/18 0424  WBC 13.3*   < > 10.3 8.5 8.9 8.7 7.5  NEUTROABS 11.4*  --   --   --   --   --  5.5  HGB 7.1*   < > 9.7* 9.5* 10.2* 8.9* 9.2*  HCT 23.7*   < > 32.0* 33.3* 35.4* 32.0* 30.9*  MCV 91.5   < > 92.8 97.1 97.8 99.1 94.8  PLT 470*   < > 441* 452* 199 384 337   < > = values in this interval not displayed.   Basic Metabolic Panel: Recent Labs  Lab 08/12/18 1038 08/13/18 0452 08/14/18 0910 08/15/18 0335 08/16/18 0424  NA 142 143 145 141 143  K 3.7 3.7 4.0 4.2 2.8*  CL 99 104 111 112* 108  CO2 23 18* 16* 14* 23  GLUCOSE 119* 105* 108* 63* 102*  BUN 18 17 12 9 6   CREATININE 0.69 0.61 0.59 0.49 0.37*  CALCIUM 9.5 9.0 9.4 8.8* 8.8*  MG  --   --   --   --  1.7  PHOS  --   --   --    --  2.6   GFR: Estimated Creatinine Clearance: 62.3 mL/min (A) (by C-G formula based on SCr of 0.37 mg/dL (L)). Liver Function Tests: Recent Labs  Lab 08/10/18 1440 08/11/18 0038 08/12/18 1038 08/16/18 0424  AST 18 13* 12* 10*  ALT 16 13 14 10   ALKPHOS 157* 133* 131* 79  BILITOT 0.7 0.4 1.2 0.8  PROT 8.7* 7.8 7.6 6.8  ALBUMIN 3.4* 3.0* 3.2* 3.0*   Recent Labs  Lab 08/10/18 1440  LIPASE 19   No results for input(s): AMMONIA in the last 168 hours. Coagulation Profile: No results for input(s): INR, PROTIME in the last 168 hours. Cardiac Enzymes: No results for input(s): CKTOTAL, CKMB, CKMBINDEX, TROPONINI in the last 168 hours. BNP (last 3 results) No results for input(s): PROBNP in the last 8760 hours. HbA1C: No results for input(s): HGBA1C in the last 72 hours. CBG: Recent Labs  Lab 08/15/18 1702 08/15/18 2006 08/15/18 2345 08/16/18 0459 08/16/18 0731  GLUCAP 100* 96 113* 86 104*   Lipid Profile: Recent Labs    08/16/18 0424  TRIG 172*   Thyroid Function Tests: No results for input(s): TSH, T4TOTAL, FREET4, T3FREE, THYROIDAB in the last 72 hours. Anemia Panel: No results for input(s): VITAMINB12, FOLATE, FERRITIN, TIBC, IRON, RETICCTPCT in the last 72 hours. Sepsis Labs: Recent Labs  Lab 08/10/18 1448 08/11/18 0038 08/12/18 1413  LATICACIDVEN 1.1 0.9 0.8    Recent Results (from the past 240 hour(s))  Urine culture     Status: Abnormal   Collection Time: 08/10/18  2:06 PM   Specimen: Urine, Catheterized  Result Value Ref Range Status   Specimen Description   Final    Urine Performed at [facility] [address] [place], [place] [address]    Special Requests   Final    NONE Performed at [facility], [place] [address], [place], [place] [address]    Culture MULTIPLE SPECIES PRESENT, SUGGEST RECOLLECTION (A)  Final   Report Status 08/12/2018 FINAL  Final  Culture, blood (routine x 2)     Status: None    Collection Time: 08/10/18  2:42 PM   Specimen: BLOOD  Result Value Ref Range Status   Specimen Description   Final    BLOOD RIGHT ARM  Performed at [facility], [place] [address] [place], [place] [address]    Special Requests   Final    BOTTLES DRAWN AEROBIC AND ANAEROBIC Blood Culture adequate volume Performed at [facility] [address], [place], [place] [address]    Culture   Final    NO GROWTH 5 DAYS Performed at [facility], [place] [address] [place], [place] [address]    Report Status 08/15/2018 FINAL  Final  Culture, blood (routine x 2)     Status: None   Collection Time: 08/10/18  3:29 PM   Specimen: BLOOD LEFT HAND  Result Value Ref Range Status   Specimen Description   Final    BLOOD LEFT HAND Performed at [facility] [address], [place], [place] [address]    Special Requests   Final    BOTTLES DRAWN AEROBIC AND ANAEROBIC Blood Culture adequate volume Performed at [facility] [address] [place], [place] [address]    Culture   Final    NO GROWTH 5 DAYS Performed at [facility], [place] [address] [place], [place] [address]    Report Status 08/15/2018 FINAL  Final  SARS Coronavirus 2 (CEPHEID - Performed in [place] hospital lab), Hosp Order     Status: None   Collection Time: 08/10/18  6:20 PM   Specimen: Nasopharyngeal Swab  Result Value Ref Range Status   SARS Coronavirus 2 NEGATIVE NEGATIVE Final    Comment: (NOTE) If result is NEGATIVE SARS-CoV-2 target nucleic acids are NOT DETECTED. The SARS-CoV-2 RNA is generally detectable in upper and lower  respiratory specimens during the acute phase of infection. The lowest  concentration of SARS-CoV-2 viral copies this assay can detect is 250  copies / mL. A negative result does not preclude SARS-CoV-2 infection  and should not be used as the sole basis for treatment or other  patient management decisions.  A  negative result may occur with  improper specimen collection / handling, submission of specimen other  than nasopharyngeal swab, presence of viral mutation(s) within the  areas targeted by this assay, and inadequate number of viral copies  (<250 copies / mL). A negative result must be combined with clinical  observations, patient history, and epidemiological information. If result is POSITIVE SARS-CoV-2 target nucleic acids are DETECTED. The SARS-CoV-2 RNA is generally detectable in upper and lower  respiratory specimens dur ing the acute phase of infection.  Positive  results are indicative of active infection with SARS-CoV-2.  Clinical  correlation with patient history and other diagnostic information is  necessary to determine patient infection status.  Positive results do  not rule out bacterial infection or co-infection with other viruses. If result is PRESUMPTIVE POSTIVE SARS-CoV-2 nucleic acids MAY BE PRESENT.   A presumptive positive result was obtained on the submitted specimen  and confirmed on repeat testing.  While 2019 novel coronavirus  (SARS-CoV-2) nucleic acids may be present in the submitted sample  additional confirmatory testing may be necessary for epidemiological  and / or clinical management purposes  to differentiate between  SARS-CoV-2 and other Sarbecovirus currently known to infect humans.  If clinically indicated additional testing with an alternate test  methodology [phone]) is advised. The SARS-CoV-2 RNA is generally  detectable in upper and lower respiratory sp ecimens during the acute  phase of infection. The expected result is Negative. Fact Sheet for Patients:  StrictlyIdeas.no Fact Sheet for Healthcare Providers: BankingDealers.co.za This test  is not yet approved or cleared by the [place] and has been authorized for detection and/or diagnosis of SARS-CoV-2 by FDA under an Emergency Use  Authorization (EUA).  This EUA will remain in effect (meaning this test can be used) for the duration of the COVID-19 declaration under Section 564(b)(1) of the Act, 21 U.S.C. section 360bbb-3(b)(1), unless the authorization is terminated or revoked sooner. Performed at [facility], [place] [address] [place], [place] [address]   MRSA PCR Screening     Status: Abnormal   Collection Time: 08/11/18  1:42 AM   Specimen: Nasal Mucosa; Nasopharyngeal  Result Value Ref Range Status   MRSA by PCR POSITIVE (A) NEGATIVE Final    Comment:        The GeneXpert MRSA Assay (FDA approved for NASAL specimens only), is one component of a comprehensive MRSA colonization surveillance program. It is not intended to diagnose MRSA infection nor to guide or monitor treatment for MRSA infections. RESULT CALLED TO, READ BACK BY AND VERIFIED WITH: ELLIS,C @ 0425 ON [id number] BY POTEAT,S Performed at [place] [address] [place], [place] [address]       Radiology Studies: Dg Abd Portable 1v  Result Date: 08/15/2018 CLINICAL DATA:  48 year old female with history of small-bowel obstruction. EXAM: PORTABLE ABDOMEN - 1 VIEW COMPARISON:  Abdominal radiograph 08/13/2018. FINDINGS: Percutaneous gastrostomy tube with retention balloon projecting over the epigastric region. Contrast in the stomach. Severe dilatation of small bowel loops which are gas-filled projecting over the mid abdomen measuring up to 4.6 cm in diameter. Paucity of colonic and rectal gas. No gross evidence of pneumoperitoneum. Pacemaker leads projecting over the lower thorax. Surgical clips projecting over the right lower abdomen. IMPRESSION: 1. The appearance of the abdomen remains compatible with small bowel obstruction. 2. Postoperative changes and support apparatus, as above. Electronically Signed   By: Vinnie Langton M.D.   On: 08/15/2018 06:24   [name] Ekg Site Rite  Result Date: 08/15/2018 If Site  Rite image not attached, placement could not be confirmed due to current cardiac rhythm.     LOS: 6 days   Time spent: More than 50% of that time was spent in counseling and/or coordination of care.  Antonieta Pert, MD [facility]  08/16/2018, 1:01 PM

## 2018-08-16 NOTE — Plan of Care (Signed)
  Problem: Clinical Measurements: Goal: Will remain free from infection Outcome: Progressing Goal: Cardiovascular complication will be avoided Outcome: Progressing   Problem: Nutrition: Goal: Adequate nutrition will be maintained Outcome: Progressing   Problem: Coping: Goal: Level of anxiety will decrease Outcome: Progressing   Problem: Pain Managment: Goal: General experience of comfort will improve Outcome: Progressing   Problem: Safety: Goal: Ability to remain free from injury will improve Outcome: Progressing   Problem: Skin Integrity: Goal: Risk for impaired skin integrity will decrease Outcome: Progressing   

## 2018-08-16 NOTE — Progress Notes (Signed)
Assessment & Plan: SBO vs ileus from rectus sheath hematoma  CT scan 7/21 shows SBO, large left rectus sheath hematoma17.5 x 13.0 x 7.5 cm  Large BM over weekend, none last PM or this AM  Gastrostomy to drainage  AXR yesterday with persistent dilated SB loops - will repeat in AM 7/28  Replete KCl per medical service - low this AM at 2.8 - may delay resolution of ileus  Quadriplegia s/p trach, PEG and SP catheter DM Depression Chronic hypotension COPD Sacral decubitus ulcer UTI - on rocephin  Plan:  Discussed that if no improvement, will need operative intervention.  Will follow closely.         Armandina Gemma, MD       [facility]       Office: [phone]   Chief Complaint: Rectus sheath hematoma, SBO vs ileus  Subjective: Patient in bed, pleasant.  Mild abd pain.  Denies BM or flatus.  Objective: Vital signs in last 24 hours: Temp:  [97.7 F (36.5 C)-97.8 F (36.6 C)] 97.8 F (36.6 C) (07/27 0503) Pulse Rate:  [79-80] 79 (07/27 0503) Resp:  [14-18] 17 (07/27 0503) BP: (131-157)/(89-95) 157/91 (07/27 0503) SpO2:  [97 %-100 %] 100 % (07/27 0503) FiO2 (%):  [28 %] 28 % (07/27 0440) Weight:  [45.4 kg] 45.4 kg (07/27 0503) Last BM Date: 08/14/18  Intake/Output from previous day: 07/26 0701 - 07/27 0700 In: 1298.7 [P.O.:60; I.V.:1038.7; IV Piggyback:200] Out: 2275 [Urine:925; Drains:1000] Intake/Output this shift: No intake/output data recorded.  Physical Exam: HEENT - sclerae clear, mucous membranes moist Neck - soft Abdomen - moderate distension, quiet, mild diffuse tenderness; G-tube with thin bilious; SP tube site clear Neuro - alert & oriented, responsive  Lab Results:  Recent Labs    08/15/18 0335 08/16/18 0424  WBC 8.7 7.5  HGB 8.9* 9.2*  HCT 32.0* 30.9*  PLT 384 337   BMET Recent Labs    08/15/18 0335 08/16/18 0424  NA 141 143  K 4.2 2.8*  CL 112* 108  CO2 14* 23  GLUCOSE 63* 102*  BUN 9 6  CREATININE 0.49  0.37*  CALCIUM 8.8* 8.8*   PT/INR No results for input(s): LABPROT, INR in the last 72 hours. Comprehensive Metabolic Panel:    Component Value Date/Time   NA 143 08/16/2018 0424   NA 141 08/15/2018 0335   NA 136 (A) 09/23/2016   K 2.8 (L) 08/16/2018 0424   K 4.2 08/15/2018 0335   CL 108 08/16/2018 0424   CL 112 (H) 08/15/2018 0335   CL 85 09/23/2016   CO2 23 08/16/2018 0424   CO2 14 (L) 08/15/2018 0335   BUN 6 08/16/2018 0424   BUN 9 08/15/2018 0335   BUN 35 (A) 09/23/2016   CREATININE 0.37 (L) 08/16/2018 0424   CREATININE 0.49 08/15/2018 0335   GLUCOSE 102 (H) 08/16/2018 0424   GLUCOSE 63 (L) 08/15/2018 0335   CALCIUM 8.8 (L) 08/16/2018 0424   CALCIUM 8.8 (L) 08/15/2018 0335   CALCIUM 10.4 09/23/2016   AST 10 (L) 08/16/2018 0424   AST 12 (L) 08/12/2018 1038   ALT 10 08/16/2018 0424   ALT 14 08/12/2018 1038   ALKPHOS 79 08/16/2018 0424   ALKPHOS 131 (H) 08/12/2018 1038   ALKPHOS 195 09/23/2016   BILITOT 0.8 08/16/2018 0424   BILITOT 1.2 08/12/2018 1038   BILITOT 0.2 09/23/2016   PROT 6.8 08/16/2018 0424   PROT 7.6 08/12/2018 1038   PROT 8.1 09/23/2016  ALBUMIN 3.0 (L) 08/16/2018 0424   ALBUMIN 3.2 (L) 08/12/2018 1038   ALBUMIN 4.3 09/23/2016    Studies/Results: Dg Abd Portable 1v  Result Date: 08/15/2018 CLINICAL DATA:  48 year old female with history of small-bowel obstruction. EXAM: PORTABLE ABDOMEN - 1 VIEW COMPARISON:  Abdominal radiograph 08/13/2018. FINDINGS: Percutaneous gastrostomy tube with retention balloon projecting over the epigastric region. Contrast in the stomach. Severe dilatation of small bowel loops which are gas-filled projecting over the mid abdomen measuring up to 4.6 cm in diameter. Paucity of colonic and rectal gas. No gross evidence of pneumoperitoneum. Pacemaker leads projecting over the lower thorax. Surgical clips projecting over the right lower abdomen. IMPRESSION: 1. The appearance of the abdomen remains compatible with small bowel  obstruction. 2. Postoperative changes and support apparatus, as above. Electronically Signed   By: Vinnie Langton M.D.   On: 08/15/2018 06:24   [name] Ekg Site Rite  Result Date: 08/15/2018 If Site Rite image not attached, placement could not be confirmed due to current cardiac rhythm.     Armandina Gemma 08/16/2018  Patient ID: Angela Page, female   DOB: 1970-02-11, 48 y.o.   MRN: [id number]

## 2018-08-17 ENCOUNTER — Inpatient Hospital Stay (HOSPITAL_COMMUNITY): Payer: Medicare Other

## 2018-08-17 DIAGNOSIS — Z789 Other specified health status: Secondary | ICD-10-CM

## 2018-08-17 LAB — GLUCOSE, CAPILLARY
Glucose-Capillary: 113 mg/dL — ABNORMAL HIGH (ref 70–99)
Glucose-Capillary: 114 mg/dL — ABNORMAL HIGH (ref 70–99)
Glucose-Capillary: 123 mg/dL — ABNORMAL HIGH (ref 70–99)
Glucose-Capillary: 133 mg/dL — ABNORMAL HIGH (ref 70–99)
Glucose-Capillary: 134 mg/dL — ABNORMAL HIGH (ref 70–99)
Glucose-Capillary: 135 mg/dL — ABNORMAL HIGH (ref 70–99)

## 2018-08-17 LAB — CBC
HCT: 30.4 % — ABNORMAL LOW (ref 36.0–46.0)
Hemoglobin: 9 g/dL — ABNORMAL LOW (ref 12.0–15.0)
MCH: 28.1 pg (ref 26.0–34.0)
MCHC: 29.6 g/dL — ABNORMAL LOW (ref 30.0–36.0)
MCV: 95 fL (ref 80.0–100.0)
Platelets: 292 10*3/uL (ref 150–400)
RBC: 3.2 MIL/uL — ABNORMAL LOW (ref 3.87–5.11)
RDW: 16.6 % — ABNORMAL HIGH (ref 11.5–15.5)
WBC: 8.5 10*3/uL (ref 4.0–10.5)
nRBC: 0 % (ref 0.0–0.2)

## 2018-08-17 LAB — BASIC METABOLIC PANEL
Anion gap: 6 (ref 5–15)
BUN: 8 mg/dL (ref 6–20)
CO2: 23 mmol/L (ref 22–32)
Calcium: 8.4 mg/dL — ABNORMAL LOW (ref 8.9–10.3)
Chloride: 109 mmol/L (ref 98–111)
Creatinine, Ser: <0.3 mg/dL — ABNORMAL LOW (ref 0.44–1.00)
Glucose, Bld: 146 mg/dL — ABNORMAL HIGH (ref 70–99)
Potassium: 3.2 mmol/L — ABNORMAL LOW (ref 3.5–5.1)
Sodium: 138 mmol/L (ref 135–145)

## 2018-08-17 LAB — PHOSPHORUS: Phosphorus: 1.8 mg/dL — ABNORMAL LOW (ref 2.5–4.6)

## 2018-08-17 LAB — MAGNESIUM: Magnesium: 1.7 mg/dL (ref 1.7–2.4)

## 2018-08-17 MED ORDER — POTASSIUM CHLORIDE 10 MEQ/100ML IV SOLN
10.0000 meq | INTRAVENOUS | Status: AC
  Administered 2018-08-17 (×3): 10 meq via INTRAVENOUS
  Filled 2018-08-17 (×3): qty 100

## 2018-08-17 MED ORDER — POTASSIUM PHOSPHATES 15 MMOLE/5ML IV SOLN
15.0000 mmol | Freq: Once | INTRAVENOUS | Status: AC
  Administered 2018-08-17: 15 mmol via INTRAVENOUS
  Filled 2018-08-17: qty 5

## 2018-08-17 MED ORDER — TRAVASOL 10 % IV SOLN
INTRAVENOUS | Status: AC
  Administered 2018-08-17: 17:00:00 via INTRAVENOUS
  Filled 2018-08-17: qty 576

## 2018-08-17 MED ORDER — BISACODYL 10 MG RE SUPP
10.0000 mg | Freq: Once | RECTAL | Status: AC
  Administered 2018-08-17: 10 mg via RECTAL
  Filled 2018-08-17: qty 1

## 2018-08-17 MED ORDER — MAGNESIUM SULFATE IN D5W 1-5 GM/100ML-% IV SOLN
1.0000 g | Freq: Once | INTRAVENOUS | Status: AC
  Administered 2018-08-17: 1 g via INTRAVENOUS
  Filled 2018-08-17: qty 100

## 2018-08-17 NOTE — Progress Notes (Signed)
Update given to mother over the phone.

## 2018-08-17 NOTE — Progress Notes (Signed)
Assessment & Plan: SBO vs ileus, rectus sheath hematoma             CT scan 7/21 shows SBO, large left rectus sheath hematoma17.5 x 13.0 x 7.5 cm             NPO, sips, chips - no NG tube             Gastrostomy to drainage             AXR this AM with improvement - contrast in colon, decreased small bowel loops             Suppositories this AM to stimulate BM - patient on Miralax at home  Continue to monitor for improvement - repeat AXR in AM 7/29 ordered  Quadriplegia s/p trach, PEG and SP catheter DM Depression Chronic hypotension COPD Sacral decubitus ulcer UTI - on rocephin  Plan:Radiographic improvement, exam slightly better.  Try suppository.  Will follow closely.        Armandina Gemma, MD       [facility]       Office: [phone]   Chief Complaint: SBO, rectus sheath hematoma  Subjective: Patient in bed, comfortable, watching TV.  Denies nausea or emesis.  Objective: Vital signs in last 24 hours: Temp:  [97.8 F (36.6 C)-98.3 F (36.8 C)] 97.8 F (36.6 C) (07/28 0414) Pulse Rate:  [79-81] 79 (07/28 0414) Resp:  [16-18] 17 (07/28 0414) BP: (101-141)/(86-97) 101/86 (07/28 0414) SpO2:  [98 %-100 %] 100 % (07/28 0414) FiO2 (%):  [28 %] 28 % (07/28 0338) Weight:  [45.4 kg] 45.4 kg (07/28 0416) Last BM Date: 08/14/18  Intake/Output from previous day: 07/27 0701 - 07/28 0700 In: 1797.1 [P.O.:170; I.V.:1013.4; IV Piggyback:613.7] Out: 1875 [Urine:1275; Drains:600] Intake/Output this shift: No intake/output data recorded.  Physical Exam: HEENT - sclerae clear, mucous membranes moist Neck - soft, trach clear Abdomen - protuberant; right side softer to palpation; left mid and lower abd tense with hematoma, mild ecchymosis; bilious from G-tube   Lab Results:  Recent Labs    08/16/18 0424 08/17/18 0506  WBC 7.5 8.5  HGB 9.2* 9.0*  HCT 30.9* 30.4*  PLT 337 292   BMET Recent Labs    08/16/18 0424 08/17/18 0506  NA 143  138  K 2.8* 3.2*  CL 108 109  CO2 23 23  GLUCOSE 102* 146*  BUN 6 8  CREATININE 0.37* <0.30*  CALCIUM 8.8* 8.4*   PT/INR No results for input(s): LABPROT, INR in the last 72 hours. Comprehensive Metabolic Panel:    Component Value Date/Time   NA 138 08/17/2018 0506   NA 143 08/16/2018 0424   NA 136 (A) 09/23/2016   K 3.2 (L) 08/17/2018 0506   K 2.8 (L) 08/16/2018 0424   CL 109 08/17/2018 0506   CL 108 08/16/2018 0424   CL 85 09/23/2016   CO2 23 08/17/2018 0506   CO2 23 08/16/2018 0424   BUN 8 08/17/2018 0506   BUN 6 08/16/2018 0424   BUN 35 (A) 09/23/2016   CREATININE <0.30 (L) 08/17/2018 0506   CREATININE 0.37 (L) 08/16/2018 0424   GLUCOSE 146 (H) 08/17/2018 0506   GLUCOSE 102 (H) 08/16/2018 0424   CALCIUM 8.4 (L) 08/17/2018 0506   CALCIUM 8.8 (L) 08/16/2018 0424   CALCIUM 10.4 09/23/2016   AST 10 (L) 08/16/2018 0424   AST 12 (L) 08/12/2018 1038   ALT 10 08/16/2018 0424   ALT 14 08/12/2018  1038   ALKPHOS 79 08/16/2018 0424   ALKPHOS 131 (H) 08/12/2018 1038   ALKPHOS 195 09/23/2016   BILITOT 0.8 08/16/2018 0424   BILITOT 1.2 08/12/2018 1038   BILITOT 0.2 09/23/2016   PROT 6.8 08/16/2018 0424   PROT 7.6 08/12/2018 1038   PROT 8.1 09/23/2016   ALBUMIN 3.0 (L) 08/16/2018 0424   ALBUMIN 3.2 (L) 08/12/2018 1038   ALBUMIN 4.3 09/23/2016    Studies/Results: Dg Abd Portable 1v  Result Date: 08/17/2018 CLINICAL DATA:  Small bowel obstruction EXAM: PORTABLE ABDOMEN - 1 VIEW COMPARISON:  08/15/2018 FINDINGS: A small amount of contrast is identified within small bowel loops in the LEFT mid abdomen. Persistent dilatation of small bowel loops is noted. Large bowel loops are not dilated. Patient is gastrostomy tube in the LEFT UPPER QUADRANT. Chronic deformities of the hips. IMPRESSION: Persistent small bowel dilatation. Small amount of contrast within small bowel loops. Electronically Signed   By: Nolon Nations M.D.   On: 08/17/2018 08:06   Ir Picc Placement Right >5 Yrs  Inc Img Guide  Result Date: 08/16/2018 INDICATION: Poor venous access. In need of durable intravenous access for the initiation of TPN. EXAM: ULTRASOUND AND FLUOROSCOPIC GUIDED PICC LINE INSERTION MEDICATIONS: None. CONTRAST:  None FLUOROSCOPY TIME:  24 seconds (1 mGy) COMPLICATIONS: None immediate. TECHNIQUE: The procedure, risks, benefits, and alternatives were explained to the patient and informed written consent was obtained. A timeout was performed prior to the initiation of the procedure. 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 As such, the right upper extremity was prepped with chlorhexidine in a sterile fashion, and a sterile drape was applied covering the operative field. Maximum barrier sterile technique with sterile gowns and gloves were used for the procedure. A timeout was performed prior to the initiation of the procedure. Local anesthesia was provided with 1% lidocaine. Under direct ultrasound guidance, the brachial vein was accessed with a micropuncture kit after the overlying soft tissues were anesthetized with 1% lidocaine. After the overlying soft tissues were anesthetized, a small venotomy incision was created and a micropuncture kit was utilized to access the right brachial vein. Real-time ultrasound guidance was utilized for vascular access including the acquisition of a permanent ultrasound image documenting patency of the accessed vessel. A guidewire was advanced to the level of the superior caval-atrial junction for measurement purposes and the PICC line was cut to length. A peel-away sheath was placed and a 37 cm, 5 [place], dual lumen was inserted to level of the superior caval-atrial junction. A post procedure spot fluoroscopic was obtained. The catheter easily aspirated and flushed and was sutured in place. A dressing was placed. The patient tolerated the procedure well without immediate post  procedural complication. FINDINGS: After catheter placement, the tip lies within the superior cavoatrial junction. The catheter aspirates and flushes normally and is ready for immediate use. IMPRESSION: Successful ultrasound and fluoroscopic guided placement of a right brachial vein approach, 37 cm, 5 French, dual lumen PICC with tip at the superior caval-atrial junction. The PICC line is ready for immediate use. Electronically Signed   By: Sandi Mariscal M.D.   On: 08/16/2018 16:49   [name] Ekg Site Rite  Result Date: 08/15/2018 If Site Rite image not attached, placement could not be confirmed due to current cardiac rhythm.     Armandina Gemma 08/17/2018  Patient ID: Angela Page, female   DOB: 11/23/1970, 48 y.o.  MRN: [id number]

## 2018-08-17 NOTE — Progress Notes (Signed)
PHARMACY - ADULT TOTAL PARENTERAL NUTRITION CONSULT NOTE   Pharmacy Consult for TPN Indication:  SBO vs ileus from rectus sheath hematoma  HPI:  48 y/o F with hx of quadriplegia from traumatic C spine fracture admitted 08/10/18 with large rectus sheath hematoma and SBO vs ileus, which has persisted. Orders received 08/15/18 to begin TPN with pharmacy to assist.    Significant Events:  7/26: VAS Team was unable to place PICC due to patient-specific factors (see chart note).  They report IR will need to place PICC and that the attending MD was notified of this. 7/27: PICC line placed  TPN Access: PICC TPN start date: 08/16/2018  Patient Measurements: Estimated body mass index is 18.29 kg/m as calculated from the following:   Height as of this encounter: 5\' 2"  (1.575 m).   Weight as of this encounter: 100 lb (45.4 kg).  Intake/Output:  Intake/Output Summary (Last 24 hours) at 08/17/2018 0743 Last data filed at 08/17/2018 0600 Gross per 24 hour  Intake 1797.13 ml  Output 1875 ml  Net -77.87 ml    Insulin Requirements: PTA regimen for DM2 was reportedly Basaglar 5 units qHS + Novolog SSI H7C along with Trulicity 1.5 mg SQ weekly. As inpatient has been receiving only Novolog SSI sensitive scale q4h. Received 3 units in past 24 hours via sliding scale.   Current Nutrition:  TPN. Patient NPO and currently off her usual tube feedings.  IVF: D5NS at 20 ml/hr   Recent Labs    08/16/18 0424 08/17/18 0506  NA 143 138  K 2.8* 3.2*  CL 108 109  CO2 23 23  GLUCOSE 102* 146*  BUN 6 8  CREATININE 0.37* <0.30*  CALCIUM 8.8* 8.4*  PHOS 2.6 1.8*  MG 1.7 1.7  ALBUMIN 3.0*  --   ALKPHOS 79  --   AST 10*  --   ALT 10  --   BILITOT 0.8  --   TRIG 172*  --   PREALBUMIN 14.2*  --     ASSESSMENT                                                                                                           Glucose - CBGs 91-134 (goal < 150)  Electrolytes - K+ low at 3.2 after repletion  yesterday, Phos low at 1.8, Mag on low end of range at 1.7,  Ca corrects to WNL  Renal - SCr low, BUN WNL  LFTs -  All below ULN   TGs - slightly elevated at baseline, 172 (7/27)  Prealbumin - 14.2 at baseline  NUTRITIONAL GOALS  RD recs (7/27): Kcal:  1590-1815 kcal Protein:  90-115 grams (2-2.5 grams) Fluid:  >/= 1.7 L/day  Custom TPN at goal rate of 70 ml/hr provides: - 100.8 g/day Protein (60 g/L) -  50.4 g/day Lipid  (30 g/L) -  252 g/day Dextrose (15 %) -  1764 Kcal/day   PLAN           Magnesium Sulfate 1g IV x 1 KCl 31mEq IV x 3 runs KPhos 43mmol IV x 1                                                                                                                 At 1800 today:  Increase TPN rate to 40 ml/hr.   Electrolytes in TPN: Increase K+ to 60 mEq/L, increase Phos to 20 mmol/L, increase Mag to 7.5 mEq/L, other lytes at standard concentrations, Cl:Ac ratio 1:1  Plan to advance slowly as tolerated to the goal rate.   TPN to contain standard multivitamins daily. Trace elements only on MWF due to Producer, television/film/video.  Continue IVF at 20 ml/hr.  Continue sensitive SSI q4h.   TPN lab panels on Mondays & Thursdays.  BMET, Mag, Phos in AM.   F/u daily.   Lindell Spar, PharmD, BCPS Clinical Pharmacist  08/17/2018  7:43 AM

## 2018-08-17 NOTE — Plan of Care (Signed)
  Problem: Clinical Measurements: Goal: Will remain free from infection Outcome: Progressing Goal: Diagnostic test results will improve Outcome: Progressing Goal: Cardiovascular complication will be avoided Outcome: Progressing   Problem: Nutrition: Goal: Adequate nutrition will be maintained Outcome: Progressing   Problem: Coping: Goal: Level of anxiety will decrease Outcome: Progressing   Problem: Elimination: Goal: Will not experience complications related to bowel motility Outcome: Progressing   Problem: Pain Managment: Goal: General experience of comfort will improve Outcome: Progressing   Problem: Safety: Goal: Ability to remain free from injury will improve Outcome: Progressing   Problem: Skin Integrity: Goal: Risk for impaired skin integrity will decrease Outcome: Progressing

## 2018-08-17 NOTE — Progress Notes (Signed)
PROGRESS NOTE    Angela Page  WNI:[id number] DOB: 09-10-1970 DOA: 08/10/2018 PCP: Default, Provider, MD    Brief Narrative: 48 y.o.femalewith medical history significant oftraumatic C spine fracture resulting in quadriplegia s/p trach, peg, suprapubic catheter as well as COPD, chronic hypotension, T2DM, chronic pain, SSS s/p pacemaker, and chronic decubitus ulcers presenting with abdominal pain, worsening  With no vomiting until in ED.    Imaging concerning for SBO and rectus sheath hematoma. Labs with anemia. Surgery c/s. Labs, imaging. Abx for possible UTI. Patient was admitted, seen by surgery, being managed conservatively.  Abdominal x-ray this a.m. shows slight improvement.  Assessment & Plan:   Principal Problem:   SBO (small bowel obstruction) (HCC) Active Problems:   Quadriplegia (HCC)   Symptomatic anemia   Rectus sheath hematoma, initial encounter  Bowel obstruction versus ileus from rectus sheath hematoma Appreciate surgery recommendations. Replete electrolytes as appropriate TPN started.    Rectus sheath hematoma CT of the abdomen shows hematoma 17.5 x 13.0 x 7.5 cm. Transfuse to keep hemoglobin greater than 7. Patient received 2 units of PRBC so far.    Acute blood loss anemia probably secondary to rectal sheath hematoma Transfuse to keep hemoglobin greater than 7. Continue to monitor daily CBCs.   History of quadriplegia status post trach, PEG, suprapubic catheter secondary to traumatic C-spine fracture Continue with supportive care with trial of trach collar, supplemental oxygen as needed to keep sats greater than 90%, pulmonary toilet.   Metabolic acidosis Improving   Type 2 diabetes mellitus On TPN at this time continue with sliding scale insulin and monitor. CBG (last 3)  Recent Labs    08/17/18 0411 08/17/18 0735 08/17/18 1123  GLUCAP 134* 123* 133*     Hypophosphatemia and hypokalemia Replaced as  appropriate.   Abnormal UA Completed 5 days of antibiotics.     Depression/insomnia Continue with Cymbalta and Wellbutrin.     Hypotension Resolved.    COPD Continue with home program of bronchodilators.    DVT prophylaxis: scd's Code Status:  Full code.  Family Communication: none at bedside.  Disposition Plan: Pending clinical improvement   Consultants:   General surgery   Procedures: None  Antimicrobials: Completed Rocephin  Subjective: Patient reports some improvement in pain and nausea.  Abdominal appears to be persistently distended.  Objective: Vitals:   08/17/18 0338 08/17/18 0414 08/17/18 0416 08/17/18 1308  BP:  101/86  135/83  Pulse: 80 79  80  Resp: _0 Temp:  97.8 F (36.6 C)  97.8 F (36.6 C)  TempSrc:  Oral  Oral  SpO2: 98% 100%  100%  Weight:   45.4 kg   Height:        Intake/Output Summary (Last 24 hours) at 08/17/2018 1456 Last data filed at 08/17/2018 0915 Gross per 24 hour  Intake 743.44 ml  Output 1450 ml  Net -706.56 ml   Filed Weights   08/15/18 0637 08/16/18 0503 08/17/18 0416  Weight: 42.2 kg 45.4 kg 45.4 kg    Examination:  General exam: Appears calm and comfortable status post trach, PEG Respiratory system: Clear to auscultation. Respiratory effort normal. Cardiovascular system: S1 & S2 heard, RRR. Gastrointestinal system: Abdomen is firm, distended, suprapubic catheter Central nervous system: Alert and oriented.  Quadriplegic Extremities: Quadriplegic Skin: Sacral decubitus ulcer Psychiatry: Mood & affect appropriate.     Data Reviewed: I have personally reviewed following labs and imaging studies  CBC: Recent Labs  Lab 08/13/18 0452 08/14/18 0910 08/15/18  0335 08/16/18 0424 08/17/18 0506  WBC 8.5 8.9 8.7 7.5 8.5  NEUTROABS  --   --   --  5.5  --   HGB 9.5* 10.2* 8.9* 9.2* 9.0*  HCT 33.3* 35.4* 32.0* 30.9* 30.4*  MCV 97.1 97.8 99.1 94.8 95.0  PLT 452* 199 384 337 443   Basic Metabolic  Panel: Recent Labs  Lab 08/13/18 0452 08/14/18 0910 08/15/18 0335 08/16/18 0424 08/17/18 0506  NA 143 145 141 143 138  K 3.7 4.0 4.2 2.8* 3.2*  CL 104 111 112* 108 109  CO2 18* 16* 14* 23 23  GLUCOSE 105* 108* 63* 102* 146*  BUN _0 CREATININE 0.61 0.59 0.49 0.37* <0.30*  CALCIUM 9.0 9.4 8.8* 8.8* 8.4*  MG  --   --   --  1.7 1.7  PHOS  --   --   --  2.6 1.8*   GFR: CrCl cannot be calculated (This lab value cannot be used to calculate CrCl because it is not a number: <0.30). Liver Function Tests: Recent Labs  Lab 08/11/18 0038 08/12/18 1038 08/16/18 0424  AST 13* 12* 10*  ALT _1 ALKPHOS 133* 131* 79  BILITOT 0.4 1.2 0.8  PROT 7.8 7.6 6.8  ALBUMIN 3.0* 3.2* 3.0*   No results for input(s): LIPASE, AMYLASE in the last 168 hours. No results for input(s): AMMONIA in the last 168 hours. Coagulation Profile: No results for input(s): INR, PROTIME in the last 168 hours. Cardiac Enzymes: No results for input(s): CKTOTAL, CKMB, CKMBINDEX, TROPONINI in the last 168 hours. BNP (last 3 results) No results for input(s): PROBNP in the last 8760 hours. HbA1C: No results for input(s): HGBA1C in the last 72 hours. CBG: Recent Labs  Lab 08/16/18 2027 08/16/18 2354 08/17/18 0411 08/17/18 0735 08/17/18 1123  GLUCAP 129* 134* 134* 123* 133*   Lipid Profile: Recent Labs    08/16/18 0424  TRIG 172*   Thyroid Function Tests: No results for input(s): TSH, T4TOTAL, FREET4, T3FREE, THYROIDAB in the last 72 hours. Anemia Panel: No results for input(s): VITAMINB12, FOLATE, FERRITIN, TIBC, IRON, RETICCTPCT in the last 72 hours. Sepsis Labs: Recent Labs  Lab 08/11/18 0038 08/12/18 1413  LATICACIDVEN 0.9 0.8    Recent Results (from the past 240 hour(s))  Urine culture     Status: Abnormal   Collection Time: 08/10/18  2:06 PM   Specimen: Urine, Catheterized  Result Value Ref Range Status   Specimen Description   Final    Urine Performed at [facility] [address] [place], [place] [address]    Special Requests   Final    NONE Performed at [facility], [place] [address], [place], [place] [address]    Culture MULTIPLE SPECIES PRESENT, SUGGEST RECOLLECTION (A)  Final   Report Status 08/12/2018 FINAL  Final  Culture, blood (routine x 2)     Status: None   Collection Time: 08/10/18  2:42 PM   Specimen: BLOOD  Result Value Ref Range Status   Specimen Description   Final    BLOOD RIGHT ARM Performed at [facility] [address] [place], [place] [address]    Special Requests   Final    BOTTLES DRAWN AEROBIC AND ANAEROBIC Blood Culture adequate volume Performed at [facility] [address], [place], [place] [address]    Culture   Final    NO GROWTH 5 DAYS Performed at [facility], [place] [address], [place], [place] [address]    Report Status 08/15/2018 FINAL  Final  Culture, blood (routine x 2)     Status: None   Collection Time: 08/10/18  3:29 PM   Specimen: BLOOD LEFT HAND  Result Value Ref Range Status   Specimen Description   Final    BLOOD LEFT HAND Performed at [facility] [address], [place], [place] [address]    Special Requests   Final    BOTTLES DRAWN AEROBIC AND ANAEROBIC Blood Culture adequate volume Performed at [facility] [address] [place], [place] [address]    Culture   Final    NO GROWTH 5 DAYS Performed at [facility], [place] [address], [place], [place] [address]    Report Status 08/15/2018 FINAL  Final  SARS Coronavirus 2 (CEPHEID - Performed in [facility] hospital lab), Hosp Order     Status: None   Collection Time: 08/10/18  6:20 PM   Specimen: Nasopharyngeal Swab  Result Value Ref Range Status   SARS Coronavirus 2 NEGATIVE NEGATIVE Final    Comment: (NOTE) If result is NEGATIVE SARS-CoV-2 target nucleic acids are NOT DETECTED. The SARS-CoV-2 RNA is  generally detectable in upper and lower  respiratory specimens during the acute phase of infection. The lowest  concentration of SARS-CoV-2 viral copies this assay can detect is 250  copies / mL. A negative result does not preclude SARS-CoV-2 infection  and should not be used as the sole basis for treatment or other  patient management decisions.  A negative result may occur with  improper specimen collection / handling, submission of specimen other  than nasopharyngeal swab, presence of viral mutation(s) within the  areas targeted by this assay, and inadequate number of viral copies  (<250 copies / mL). A negative result must be combined with clinical  observations, patient history, and epidemiological information. If result is POSITIVE SARS-CoV-2 target nucleic acids are DETECTED. The SARS-CoV-2 RNA is generally detectable in upper and lower  respiratory specimens dur ing the acute phase of infection.  Positive  results are indicative of active infection with SARS-CoV-2.  Clinical  correlation with patient history and other diagnostic information is  necessary to determine patient infection status.  Positive results do  not rule out bacterial infection or co-infection with other viruses. If result is PRESUMPTIVE POSTIVE SARS-CoV-2 nucleic acids MAY BE PRESENT.   A presumptive positive result was obtained on the submitted specimen  and confirmed on repeat testing.  While 2019 novel coronavirus  (SARS-CoV-2) nucleic acids may be present in the submitted sample  additional confirmatory testing may be necessary for epidemiological  and / or clinical management purposes  to differentiate between  SARS-CoV-2 and other Sarbecovirus currently known to infect humans.  If clinically indicated additional testing with an alternate test  methodology [phone]) is advised. The SARS-CoV-2 RNA is generally  detectable in upper and lower respiratory sp ecimens during the acute  phase of  infection. The expected result is Negative. Fact Sheet for Patients:  StrictlyIdeas.no Fact Sheet for Healthcare Providers: BankingDealers.co.za This test is not yet approved or cleared by the [place] FDA and has been authorized for detection and/or diagnosis of SARS-CoV-2 by FDA under an Emergency Use Authorization (EUA).  This EUA will remain in effect (meaning this test can be used) for the duration of the COVID-19 declaration under Section 564(b)(1) of the Act, 21 U.S.C. section 360bbb-3(b)(1), unless the authorization is  terminated or revoked sooner. Performed at [facility], [place] [address] [place], [place] [address]   MRSA PCR Screening     Status: Abnormal   Collection Time: 08/11/18  1:42 AM   Specimen: Nasal Mucosa; Nasopharyngeal  Result Value Ref Range Status   MRSA by PCR POSITIVE (A) NEGATIVE Final    Comment:        The GeneXpert MRSA Assay (FDA approved for NASAL specimens only), is one component of a comprehensive MRSA colonization surveillance program. It is not intended to diagnose MRSA infection nor to guide or monitor treatment for MRSA infections. RESULT CALLED TO, READ BACK BY AND VERIFIED WITH: ELLIS,C @ 0425 ON [id number] BY POTEAT,S Performed at [facility] [address] [place], [place] [address]          Radiology Studies: Dg Abd Portable 1v  Result Date: 08/17/2018 CLINICAL DATA:  Small bowel obstruction EXAM: PORTABLE ABDOMEN - 1 VIEW COMPARISON:  08/15/2018 FINDINGS: A small amount of contrast is identified within small bowel loops in the LEFT mid abdomen. Persistent dilatation of small bowel loops is noted. Large bowel loops are not dilated. Patient is gastrostomy tube in the LEFT UPPER QUADRANT. Chronic deformities of the hips. IMPRESSION: Persistent small bowel dilatation. Small amount of contrast within small bowel loops. Electronically Signed   By:  Nolon Nations M.D.   On: 08/17/2018 08:06   Ir Picc Placement Right >5 Yrs Inc Img Guide  Result Date: 08/16/2018 INDICATION: Poor venous access. In need of durable intravenous access for the initiation of TPN. EXAM: ULTRASOUND AND FLUOROSCOPIC GUIDED PICC LINE INSERTION MEDICATIONS: None. CONTRAST:  None FLUOROSCOPY TIME:  24 seconds (1 mGy) COMPLICATIONS: None immediate. TECHNIQUE: The procedure, risks, benefits, and alternatives were explained to the patient and informed written consent was obtained. A timeout was performed prior to the initiation of the procedure. 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 As such, the right upper extremity was prepped with chlorhexidine in a sterile fashion, and a sterile drape was applied covering the operative field. Maximum barrier sterile technique with sterile gowns and gloves were used for the procedure. A timeout was performed prior to the initiation of the procedure. Local anesthesia was provided with 1% lidocaine. Under direct ultrasound guidance, the brachial vein was accessed with a micropuncture kit after the overlying soft tissues were anesthetized with 1% lidocaine. After the overlying soft tissues were anesthetized, a small venotomy incision was created and a micropuncture kit was utilized to access the right brachial vein. Real-time ultrasound guidance was utilized for vascular access including the acquisition of a permanent ultrasound image documenting patency of the accessed vessel. A guidewire was advanced to the level of the superior caval-atrial junction for measurement purposes and the PICC line was cut to length. A peel-away sheath was placed and a 37 cm, 5 [place], dual lumen was inserted to level of the superior caval-atrial junction. A post procedure spot fluoroscopic was obtained. The catheter easily aspirated and flushed and was sutured in place. A dressing  was placed. The patient tolerated the procedure well without immediate post procedural complication. FINDINGS: After catheter placement, the tip lies within the superior cavoatrial junction. The catheter aspirates and flushes normally and is ready for immediate use. IMPRESSION: Successful ultrasound and fluoroscopic guided placement of a right brachial vein approach, 37 cm, 5 French, dual lumen PICC with tip at the superior caval-atrial  junction. The PICC line is ready for immediate use. Electronically Signed   By: Sandi Mariscal M.D.   On: 08/16/2018 16:49        Scheduled Meds:  sodium chloride   Intravenous Once   baclofen  5 mg Oral TID   buPROPion  150 mg Oral Daily   DULoxetine  30 mg Oral BID   gabapentin  300 mg Oral TID   insulin aspart  0-9 Units Subcutaneous Q4H   LORazepam  0.5 mg Intravenous Once   sodium chloride flush  10-40 mL Intracatheter Q12H   umeclidinium bromide  1 puff Inhalation Daily   zolpidem  2.5 mg Oral QHS   Continuous Infusions:  dextrose 5 % and 0.9% NaCl 20 mL/hr at 08/17/18 0600   potassium PHOSPHATE IVPB (in mmol) 15 mmol (08/17/18 1215)   TPN ADULT (ION) 30 mL/hr at 08/17/18 0600   TPN ADULT (ION)       LOS: 7 days    Time spent: 35 minutes.     Hosie Poisson, MD [facility] Pager [phone]   If 7PM-7AM, please contact night-coverage [email or web address] Password [facility] 08/17/2018, 2:56 PM

## 2018-08-18 ENCOUNTER — Inpatient Hospital Stay (HOSPITAL_COMMUNITY): Payer: Medicare Other

## 2018-08-18 LAB — BASIC METABOLIC PANEL
Anion gap: 9 (ref 5–15)
BUN: 13 mg/dL (ref 6–20)
CO2: 23 mmol/L (ref 22–32)
Calcium: 8.7 mg/dL — ABNORMAL LOW (ref 8.9–10.3)
Chloride: 103 mmol/L (ref 98–111)
Creatinine, Ser: 0.3 mg/dL — ABNORMAL LOW (ref 0.44–1.00)
GFR calc Af Amer: >60 mL/min (ref >60)
GFR calc non Af Amer: >60 mL/min (ref >60)
Glucose, Bld: 135 mg/dL — ABNORMAL HIGH (ref 70–99)
Potassium: 4.2 mmol/L (ref 3.5–5.1)
Sodium: 135 mmol/L (ref 135–145)

## 2018-08-18 LAB — PHOSPHORUS: Phosphorus: 3 mg/dL (ref 2.5–4.6)

## 2018-08-18 LAB — MAGNESIUM: Magnesium: 2 mg/dL (ref 1.7–2.4)

## 2018-08-18 LAB — GLUCOSE, CAPILLARY
Glucose-Capillary: 127 mg/dL — ABNORMAL HIGH (ref 70–99)
Glucose-Capillary: 120 mg/dL — ABNORMAL HIGH (ref 70–99)
Glucose-Capillary: 149 mg/dL — ABNORMAL HIGH (ref 70–99)
Glucose-Capillary: 140 mg/dL — ABNORMAL HIGH (ref 70–99)
Glucose-Capillary: 116 mg/dL — ABNORMAL HIGH (ref 70–99)

## 2018-08-18 MED ORDER — TRAVASOL 10 % IV SOLN
INTRAVENOUS | Status: AC
  Administered 2018-08-18: 18:00:00 via INTRAVENOUS
  Filled 2018-08-18: qty 792

## 2018-08-18 MED ORDER — BISACODYL 10 MG RE SUPP: 10.0000 mg | Freq: Every day | RECTAL | Status: DC | PRN

## 2018-08-18 NOTE — Progress Notes (Signed)
°PROGRESS NOTE ° ° ° °Angela Page  MRN:[id number] DOB: 12/15/1970 DOA: 08/10/2018 °PCP: Default, Provider, MD  ° ° °Brief Narrative: °48 y.o. female with medical history significant of traumatic C spine fracture resulting in quadriplegia s/p trach, peg, suprapubic catheter as well as COPD, chronic hypotension, T2DM, chronic pain, SSS s/p pacemaker, and chronic decubitus ulcers presenting with abdominal pain, worsening  With no vomiting until in ED.   ° °Imaging concerning for SBO and rectus sheath hematoma.  Labs with anemia.  Surgery c/s.  Labs, imaging.  Abx for possible UTI. °Patient was admitted, seen by surgery, being managed conservatively. ° °Abdominal x-ray this a.m. shows slight improvement. ° °Assessment & Plan: °  °Principal Problem: °  SBO (small bowel obstruction) (HCC) °Active Problems: °  Quadriplegia (HCC) °  Symptomatic anemia °  Rectus sheath hematoma, initial encounter ° °Bowel obstruction versus ileus from rectus sheath hematoma °abd X ray shows resolution of the SBO. Clears started by surgery.  °Appreciate surgery recommendations. °Replete electrolytes as appropriate °TPN started. ° ° ° °Rectus sheath hematoma °CT of the abdomen shows hematoma 17.5 x 13.0 x 7.5 cm. °Transfuse to keep hemoglobin greater than 7. °Patient received 2 units of PRBC so far. ° ° ° °Acute blood loss anemia probably secondary to rectal sheath hematoma °Transfuse to keep hemoglobin greater than 7. °Continue to monitor daily CBCs. ° ° °History of quadriplegia status post trach, PEG, suprapubic catheter secondary to traumatic C-spine fracture °Continue with supportive care with trial of trach collar, supplemental oxygen as needed to keep sats greater than 90%, pulmonary toilet. ° ° °Metabolic acidosis °Improving ° ° °Type 2 diabetes mellitus °On TPN at this time continue with sliding scale insulin and monitor. °CBG (last 3)  °Recent Labs  °  08/18/18 °0413 08/18/18 °0735 08/18/18 °1135  °GLUCAP 127* 120* 149*   ° ° ° °Hypophosphatemia and hypokalemia °Replaced as appropriate. ° ° °Abnormal UA °Completed 5 days of antibiotics. ° ° ° ° °Depression/insomnia °Continue with Cymbalta and Wellbutrin. ° ° ° ° °Hypotension °Resolved. ° ° ° °COPD °Continue with home program of bronchodilators. ° ° ° °DVT prophylaxis: scd's °Code Status:  Full code.  °Family Communication: none at bedside.  °Disposition Plan: Pending clinical improvement ° ° °Consultants:  °· General surgery ° °· Procedures: None ° °Antimicrobials: Completed Rocephin ° °Subjective: °No chest pain, abd pain is persistent. But nausea has improved.  ° °Objective: °Vitals:  ° 08/18/18 0416 08/18/18 0804 08/18/18 1206 08/18/18 1245  °BP: 128/90   123/77  °Pulse: 79 (!) 19  86  °Resp: 18 16  16  °Temp: 98.3 °F (36.8 °C)   99.7 °F (37.6 °C)  °TempSrc: Oral   Oral  °SpO2: 100% 97% 96% 98%  °Weight:      °Height:      ° ° °Intake/Output Summary (Last 24 hours) at 08/18/2018 1413 °Last data filed at 08/18/2018 1245 °Gross per 24 hour  °Intake 1279.47 ml  °Output 2375 ml  °Net -1095.53 ml  ° °Filed Weights  ° 08/16/18 0503 08/17/18 0416 08/18/18 0416  °Weight: 45.4 kg 45.4 kg 44.9 kg  ° ° °Examination: ° °General exam: Appears calm and comfortable status post trach, PEG °Respiratory system: Clear to auscultation. Respiratory effort normal. °Cardiovascular system: S1 & S2 heard, RRR. °Gastrointestinal system: Abdomen is firm, distended, suprapubic catheter °Central nervous system: Alert and oriented.  Quadriplegic °Extremities: Quadriplegic °Skin: Sacral decubitus ulcer °Psychiatry: Mood & affect appropriate.  ° ° ° °Data Reviewed: I have personally reviewed following labs and imaging studies ° °  CBC: Recent Labs  Lab 08/13/18 0452 08/14/18 0910 08/15/18 0335 08/16/18 0424 08/17/18 0506  WBC 8.5 8.9 8.7 7.5 8.5  NEUTROABS  --   --   --  5.5  --   HGB 9.5* 10.2* 8.9* 9.2* 9.0*  HCT 33.3* 35.4* 32.0* 30.9* 30.4*  MCV 97.1 97.8 99.1 94.8 95.0  PLT 452* 199 384 337 109    Basic Metabolic Panel: Recent Labs  Lab 08/14/18 0910 08/15/18 0335 08/16/18 0424 08/17/18 0506 08/18/18 0441  NA 145 141 143 138 135  K 4.0 4.2 2.8* 3.2* 4.2  CL 111 112* 108 109 103  CO2 16* 14* _0 GLUCOSE 108* 63* 102* 146* 135*  BUN _1 CREATININE 0.59 0.49 0.37* <0.30* 0.30*  CALCIUM 9.4 8.8* 8.8* 8.4* 8.7*  MG  --   --  1.7 1.7 2.0  PHOS  --   --  2.6 1.8* 3.0   GFR: Estimated Creatinine Clearance: 61.6 mL/min (A) (by C-G formula based on SCr of 0.3 mg/dL (L)). Liver Function Tests: Recent Labs  Lab 08/12/18 1038 08/16/18 0424  AST 12* 10*  ALT 14 10  ALKPHOS 131* 79  BILITOT 1.2 0.8  PROT 7.6 6.8  ALBUMIN 3.2* 3.0*   No results for input(s): LIPASE, AMYLASE in the last 168 hours. No results for input(s): AMMONIA in the last 168 hours. Coagulation Profile: No results for input(s): INR, PROTIME in the last 168 hours. Cardiac Enzymes: No results for input(s): CKTOTAL, CKMB, CKMBINDEX, TROPONINI in the last 168 hours. BNP (last 3 results) No results for input(s): PROBNP in the last 8760 hours. HbA1C: No results for input(s): HGBA1C in the last 72 hours. CBG: Recent Labs  Lab 08/17/18 2024 08/17/18 2341 08/18/18 0413 08/18/18 0735 08/18/18 1135  GLUCAP 114* 113* 127* 120* 149*   Lipid Profile: Recent Labs    08/16/18 0424  TRIG 172*   Thyroid Function Tests: No results for input(s): TSH, T4TOTAL, FREET4, T3FREE, THYROIDAB in the last 72 hours. Anemia Panel: No results for input(s): VITAMINB12, FOLATE, FERRITIN, TIBC, IRON, RETICCTPCT in the last 72 hours. Sepsis Labs: Recent Labs  Lab 08/12/18 1413  LATICACIDVEN 0.8    Recent Results (from the past 240 hour(s))  Urine culture     Status: Abnormal   Collection Time: 08/10/18  2:06 PM   Specimen: Urine, Catheterized  Result Value Ref Range Status   Specimen Description   Final    Urine Performed at [facility] [address], [place], [place]  [address]    Special Requests   Final    NONE Performed at [facility], [place] [address], [place], [place] [address]    Culture MULTIPLE SPECIES PRESENT, SUGGEST RECOLLECTION (A)  Final   Report Status 08/12/2018 FINAL  Final  Culture, blood (routine x 2)     Status: None   Collection Time: 08/10/18  2:42 PM   Specimen: BLOOD  Result Value Ref Range Status   Specimen Description   Final    BLOOD RIGHT ARM Performed at [facility] [address], [place], [place] [address]    Special Requests   Final    BOTTLES DRAWN AEROBIC AND ANAEROBIC Blood Culture adequate volume Performed at [facility] [address], [place], [place] [address]    Culture   Final    NO GROWTH 5 DAYS Performed at [facility], [place] [address], [place], [place] [address]  Report Status 08/15/2018 FINAL  Final  °Culture, blood (routine x 2)     Status: None  ° Collection Time: 08/10/18  3:29 PM  ° Specimen: BLOOD LEFT HAND  °Result Value Ref Range Status  ° Specimen Description   Final  °  BLOOD LEFT HAND °Performed at [facility], [address] [place], [place] [address] °  ° Special Requests   Final  °  BOTTLES DRAWN AEROBIC AND ANAEROBIC Blood Culture adequate volume °Performed at [facility], [address] [place], [place] [address] °  ° Culture   Final  °  NO GROWTH 5 DAYS °Performed at [facility], [address] [place], [place] [address] °  ° Report Status 08/15/2018 FINAL  Final  °SARS Coronavirus 2 (CEPHEID - Performed in [facility] hospital lab), Hosp Order     Status: None  ° Collection Time: 08/10/18  6:20 PM  ° Specimen: Nasopharyngeal Swab  °Result Value Ref Range Status  ° SARS Coronavirus 2 NEGATIVE NEGATIVE Final  °  Comment: (NOTE) °If result is NEGATIVE °SARS-CoV-2 target nucleic acids are NOT DETECTED. °The SARS-CoV-2 RNA is generally detectable in upper and lower  °respiratory specimens  during the acute phase of infection. The lowest  °concentration of SARS-CoV-2 viral copies this assay can detect is 250  °copies / mL. A negative result does not preclude SARS-CoV-2 infection  °and should not be used as the sole basis for treatment or other  °patient management decisions.  A negative result may occur with  °improper specimen collection / handling, submission of specimen other  °than nasopharyngeal swab, presence of viral mutation(s) within the  °areas targeted by this assay, and inadequate number of viral copies  °(<250 copies / mL). A negative result must be combined with clinical  °observations, patient history, and epidemiological information. °If result is POSITIVE °SARS-CoV-2 target nucleic acids are DETECTED. °The SARS-CoV-2 RNA is generally detectable in upper and lower  °respiratory specimens dur °ing the acute phase of infection.  Positive  °results are indicative of active infection with SARS-CoV-2.  Clinical  °correlation with patient history and other diagnostic information is  °necessary to determine patient infection status.  Positive results do  °not rule out bacterial infection or co-infection with other viruses. °If result is PRESUMPTIVE POSTIVE °SARS-CoV-2 nucleic acids MAY BE PRESENT.   °A presumptive positive result was obtained on the submitted specimen  °and confirmed on repeat testing.  While 2019 novel coronavirus  °(SARS-CoV-2) nucleic acids may be present in the submitted sample  °additional confirmatory testing may be necessary for epidemiological  °and / or clinical management purposes  to differentiate between  °SARS-CoV-2 and other [place]ovirus currently known to infect humans.  °If clinically indicated additional testing with an alternate test  °methodology ([id number]) is advised. The SARS-CoV-2 RNA is generally  °detectable in upper and lower respiratory sp °ecimens during the acute  °phase of infection. °The expected result is Negative. °Fact Sheet for Patients:   https://www.fda.gov/media/136312/download °Fact Sheet for Healthcare Providers: °https://www.fda.gov/media/136313/download °This test is not yet approved or cleared by the [place] FDA and °has been authorized for detection and/or diagnosis of SARS-CoV-2 by °FDA under an Emergency Use Authorization (EUA).  This EUA will remain °in effect (meaning this test can be used) for the duration of the °COVID-19 declaration under Section 564(b)(1) of the Act, 21 U.S.C. °section 360bbb-3(b)(1), unless the authorization is terminated or °revoked sooner. °Performed at [facility], [address], [place], [place] [address]   MRSA PCR Screening     Status: Abnormal   Collection Time: 08/11/18  1:42 AM   Specimen: Nasal Mucosa; Nasopharyngeal  Result Value Ref Range Status   MRSA by PCR POSITIVE (A) NEGATIVE Final    Comment:        The GeneXpert MRSA Assay (FDA approved for NASAL specimens only), is one component of a comprehensive MRSA colonization surveillance program. It is not intended to diagnose MRSA infection nor to guide or monitor treatment for MRSA infections. RESULT CALLED TO, READ BACK BY AND VERIFIED WITH: ELLIS,C @ 0425 ON [id number] BY POTEAT,S Performed at [facility] [address], [place], [place] [address]          Radiology Studies: Dg Abd Portable 1v  Result Date: 08/18/2018 CLINICAL DATA:  Abdominal distension. History of small-bowel obstruction. EXAM: PORTABLE ABDOMEN - 1 VIEW COMPARISON:  Multiple previous abdominal films. Most recent is yesterday. FINDINGS: Most of the contrast has now passed into the colon. There are some persistent air-filled loops of small bowel but no significant distension. No free air. Stable position of the feeding gastrostomy tube. IMPRESSION: Interval resolution of small-bowel obstruction with passage of contrast into the colon. Electronically Signed   By: Marijo Sanes M.D.   On: 08/18/2018 08:16   Dg  Abd Portable 1v  Result Date: 08/17/2018 CLINICAL DATA:  Small bowel obstruction EXAM: PORTABLE ABDOMEN - 1 VIEW COMPARISON:  08/15/2018 FINDINGS: A small amount of contrast is identified within small bowel loops in the LEFT mid abdomen. Persistent dilatation of small bowel loops is noted. Large bowel loops are not dilated. Patient is gastrostomy tube in the LEFT UPPER QUADRANT. Chronic deformities of the hips. IMPRESSION: Persistent small bowel dilatation. Small amount of contrast within small bowel loops. Electronically Signed   By: Nolon Nations M.D.   On: 08/17/2018 08:06   Ir Picc Placement Right >5 Yrs Inc Img Guide  Result Date: 08/16/2018 INDICATION: Poor venous access. In need of durable intravenous access for the initiation of TPN. EXAM: ULTRASOUND AND FLUOROSCOPIC GUIDED PICC LINE INSERTION MEDICATIONS: None. CONTRAST:  None FLUOROSCOPY TIME:  24 seconds (1 mGy) COMPLICATIONS: None immediate. TECHNIQUE: The procedure, risks, benefits, and alternatives were explained to the patient and informed written consent was obtained. A timeout was performed prior to the initiation of the procedure. 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 As such, the right upper extremity was prepped with chlorhexidine in a sterile fashion, and a sterile drape was applied covering the operative field. Maximum barrier sterile technique with sterile gowns and gloves were used for the procedure. A timeout was performed prior to the initiation of the procedure. Local anesthesia was provided with 1% lidocaine. Under direct ultrasound guidance, the brachial vein was accessed with a micropuncture kit after the overlying soft tissues were anesthetized with 1% lidocaine. After the overlying soft tissues were anesthetized, a small venotomy incision was created and a micropuncture kit was utilized to access the right brachial vein. Real-time  ultrasound guidance was utilized for vascular access including the acquisition of a permanent ultrasound image documenting patency of the accessed vessel. A guidewire was advanced to the level of the superior caval-atrial junction for measurement purposes and the PICC line was cut to length. A peel-away sheath was placed and a 37 cm, 5 [place], dual lumen was inserted to level of the superior caval-atrial  junction. A post procedure spot fluoroscopic was obtained. The catheter easily aspirated and flushed and was sutured in place. A dressing was placed. The patient tolerated the procedure well without immediate post procedural complication. FINDINGS: After catheter placement, the tip lies within the superior cavoatrial junction. The catheter aspirates and flushes normally and is ready for immediate use. IMPRESSION: Successful ultrasound and fluoroscopic guided placement of a right brachial vein approach, 37 cm, 5 French, dual lumen PICC with tip at the superior caval-atrial junction. The PICC line is ready for immediate use. Electronically Signed   By: Sandi Mariscal M.D.   On: 08/16/2018 16:49        Scheduled Meds:  sodium chloride   Intravenous Once   baclofen  5 mg Oral TID   buPROPion  150 mg Oral Daily   DULoxetine  30 mg Oral BID   gabapentin  300 mg Oral TID   insulin aspart  0-9 Units Subcutaneous Q4H   LORazepam  0.5 mg Intravenous Once   sodium chloride flush  10-40 mL Intracatheter Q12H   umeclidinium bromide  1 puff Inhalation Daily   zolpidem  2.5 mg Oral QHS   Continuous Infusions:  dextrose 5 % and 0.9% NaCl 20 mL/hr at 08/18/18 0600   TPN ADULT (ION) 40 mL/hr at 08/18/18 0600   TPN ADULT (ION)       LOS: 8 days    Time spent: 30 minutes.     Hosie Poisson, MD [facility] Pager [phone]   If 7PM-7AM, please contact night-coverage [email or web address] Password [email or web address] 08/18/2018, 2:13 PM

## 2018-08-18 NOTE — Progress Notes (Signed)
PHARMACY - ADULT TOTAL PARENTERAL NUTRITION CONSULT NOTE   Pharmacy Consult for TPN Indication:  SBO vs ileus from rectus sheath hematoma  HPI:  48 y/o F with hx of quadriplegia from traumatic C spine fracture admitted 08/10/18 with large rectus sheath hematoma and SBO vs ileus, which has persisted. Orders received 08/15/18 to begin TPN with pharmacy to assist.    Significant Events:  7/26: VAS Team was unable to place PICC due to patient-specific factors (see chart note).  They report IR will need to place PICC and that the attending MD was notified of this. 7/27: PICC line placed  TPN Access: PICC TPN start date: 08/16/2018  Patient Measurements: Estimated body mass index is 18.11 kg/m as calculated from the following:   Height as of this encounter: 5\' 2"  (1.575 m).   Weight as of this encounter: 99 lb (44.9 kg).  Intake/Output:  Intake/Output Summary (Last 24 hours) at 08/18/2018 0727 Last data filed at 08/18/2018 0600 Gross per 24 hour  Intake 1718.28 ml  Output 2175 ml  Net -456.72 ml    Insulin Requirements: PTA regimen for DM2 was reportedly Basaglar 5 units qHS + Novolog SSI N0U along with Trulicity 1.5 mg SQ weekly. As inpatient has been receiving only Novolog SSI sensitive scale q4h. Received 4 units in past 24 hours via sliding scale.   Current Nutrition:  TPN. Start clear liquid diet today per surgery. Patient currently off her usual tube feedings.  IVF: D5NS at 20 ml/hr   Recent Labs    08/16/18 0424 08/17/18 0506 08/18/18 0441  NA 143 138 135  K 2.8* 3.2* 4.2  CL 108 109 103  CO2 23 23 23   GLUCOSE 102* 146* 135*  BUN 6 8 13   CREATININE 0.37* <0.30* 0.30*  CALCIUM 8.8* 8.4* 8.7*  PHOS 2.6 1.8* 3.0  MG 1.7 1.7 2.0  ALBUMIN 3.0*  --   --   ALKPHOS 79  --   --   AST 10*  --   --   ALT 10  --   --   BILITOT 0.8  --   --   TRIG 172*  --   --   PREALBUMIN 14.2*  --   --     ASSESSMENT                                                                                                            Glucose - CBGs 113-135 (goal < 150)  Electrolytes - K+ improved to 4.2 after repletion yesterday, Phos improved to 3 after repletion yesterday, Mag improved to 2 after repletion yesterday,  Ca corrects to WNL  Renal - SCr low, BUN WNL  LFTs -  All below ULN (7/27)  TGs - slightly elevated at baseline, 172 (7/27)  Prealbumin - 14.2 at baseline  NUTRITIONAL GOALS  RD recs (7/27): Kcal:  1590-1815 kcal Protein:  90-115 grams (2-2.5 grams) Fluid:  >/= 1.7 L/day  Custom TPN at goal rate of 70 ml/hr provides: - 100.8 g/day Protein (60 g/L) -  50.4 g/day Lipid  (30 g/L) -  252 g/day Dextrose (15 %) -  1764 Kcal/day   PLAN                                                                                                                         At 1800 today:  Increase TPN rate to 55 ml/hr.   Electrolytes in TPN: Continue increased K+ at 60 mEq/L, continue increased Phos at 20 mmol/L, continue increased Mag at 7.5 mEq/L, other lytes at standard concentrations, Cl:Ac ratio 1:1  Plan to advance slowly as tolerated to the goal rate.   TPN to contain standard multivitamins daily. Trace elements only on MWF due to Producer, television/film/video.  Continue IVF at 20 ml/hr.  Continue sensitive SSI q4h.   TPN lab panels on Mondays & Thursdays.  F/u daily.   Lindell Spar, PharmD, BCPS Clinical Pharmacist  08/18/2018  7:27 AM

## 2018-08-18 NOTE — Progress Notes (Signed)
[facility] Progress Note     Subjective: CC: abdominal pain Patient complaining of pain in upper abdomen. Still feeling somewhat distended. She did have several BM after suppositories yesterday, medium size and all loose. She denies nausea.   Objective: Vital signs in last 24 hours: Temp:  [97.8 F (36.6 C)-98.4 F (36.9 C)] 98.3 F (36.8 C) (07/29 0416) Pulse Rate:  [19-80] 19 (07/29 0804) Resp:  [16-20] 16 (07/29 0804) BP: (128-135)/(83-93) 128/90 (07/29 0416) SpO2:  [97 %-100 %] 97 % (07/29 0804) FiO2 (%):  [28 %] 28 % (07/29 0804) Weight:  [44.9 kg] 44.9 kg (07/29 0416) Last BM Date: 08/17/18  Intake/Output from previous day: 07/28 0701 - 07/29 0700 In: 1718.3 [I.V.:1423; IV Piggyback:295.3] Out: 2175 [Urine:2000; Drains:175] Intake/Output this shift: No intake/output data recorded.  PE: Gen:  Alert, NAD, pleasant Neck: trach c/d/i Card:  Regular rate and rhythm Pulm:  Normal effort, clear to auscultation bilaterally Abd: Soft, non-tender, mildly distended, +BS, G tube c/d/i, firmness to left hemi-abdomen from rectus hematoma Skin: warm and dry, no rashes  Psych: A&Ox3   Lab Results:  Recent Labs    08/16/18 0424 08/17/18 0506  WBC 7.5 8.5  HGB 9.2* 9.0*  HCT 30.9* 30.4*  PLT 337 292   BMET Recent Labs    08/17/18 0506 08/18/18 0441  NA 138 135  K 3.2* 4.2  CL 109 103  CO2 23 23  GLUCOSE 146* 135*  BUN 8 13  CREATININE <0.30* 0.30*  CALCIUM 8.4* 8.7*   PT/INR No results for input(s): LABPROT, INR in the last 72 hours. CMP     Component Value Date/Time   NA 135 08/18/2018 0441   NA 136 (A) 09/23/2016   K 4.2 08/18/2018 0441   CL 103 08/18/2018 0441   CL 85 09/23/2016   CO2 23 08/18/2018 0441   GLUCOSE 135 (H) 08/18/2018 0441   BUN 13 08/18/2018 0441   BUN 35 (A) 09/23/2016   CREATININE 0.30 (L) 08/18/2018 0441   CALCIUM 8.7 (L) 08/18/2018 0441   CALCIUM 10.4 09/23/2016   PROT 6.8 08/16/2018 0424   PROT 8.1 09/23/2016   ALBUMIN 3.0 (L) 08/16/2018 0424   ALBUMIN 4.3 09/23/2016   AST 10 (L) 08/16/2018 0424   ALT 10 08/16/2018 0424   ALKPHOS 79 08/16/2018 0424   ALKPHOS 195 09/23/2016   BILITOT 0.8 08/16/2018 0424   BILITOT 0.2 09/23/2016   GFRNONAA >60 08/18/2018 0441   GFRAA >60 08/18/2018 0441   Lipase     Component Value Date/Time   LIPASE 19 08/10/2018 1440       Studies/Results: Dg Abd Portable 1v  Result Date: 08/18/2018 CLINICAL DATA:  Abdominal distension. History of small-bowel obstruction. EXAM: PORTABLE ABDOMEN - 1 VIEW COMPARISON:  Multiple previous abdominal films. Most recent is yesterday. FINDINGS: Most of the contrast has now passed into the colon. There are some persistent air-filled loops of small bowel but no significant distension. No free air. Stable position of the feeding gastrostomy tube. IMPRESSION: Interval resolution of small-bowel obstruction with passage of contrast into the colon. Electronically Signed   By: Marijo Sanes M.D.   On: 08/18/2018 08:16   Dg Abd Portable 1v  Result Date: 08/17/2018 CLINICAL DATA:  Small bowel obstruction EXAM: PORTABLE ABDOMEN - 1 VIEW COMPARISON:  08/15/2018 FINDINGS: A small amount of contrast is identified within small bowel loops in the LEFT mid abdomen. Persistent dilatation of small bowel loops is noted. Large bowel loops are not dilated. Patient  is gastrostomy tube in the LEFT UPPER QUADRANT. Chronic deformities of the hips. IMPRESSION: Persistent small bowel dilatation. Small amount of contrast within small bowel loops. Electronically Signed   By: Nolon Nations M.D.   On: 08/17/2018 08:06   Ir Picc Placement Right >5 Yrs Inc Img Guide  Result Date: 08/16/2018 INDICATION: Poor venous access. In need of durable intravenous access for the initiation of TPN. EXAM: ULTRASOUND AND FLUOROSCOPIC GUIDED PICC LINE INSERTION MEDICATIONS: None. CONTRAST:  None FLUOROSCOPY TIME:  24 seconds (1 mGy) COMPLICATIONS: None immediate. TECHNIQUE: The  procedure, risks, benefits, and alternatives were explained to the patient and informed written consent was obtained. A timeout was performed prior to the initiation of the procedure. 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 As such, the right upper extremity was prepped with chlorhexidine in a sterile fashion, and a sterile drape was applied covering the operative field. Maximum barrier sterile technique with sterile gowns and gloves were used for the procedure. A timeout was performed prior to the initiation of the procedure. Local anesthesia was provided with 1% lidocaine. Under direct ultrasound guidance, the brachial vein was accessed with a micropuncture kit after the overlying soft tissues were anesthetized with 1% lidocaine. After the overlying soft tissues were anesthetized, a small venotomy incision was created and a micropuncture kit was utilized to access the right brachial vein. Real-time ultrasound guidance was utilized for vascular access including the acquisition of a permanent ultrasound image documenting patency of the accessed vessel. A guidewire was advanced to the level of the superior caval-atrial junction for measurement purposes and the PICC line was cut to length. A peel-away sheath was placed and a 37 cm, 5 [place], dual lumen was inserted to level of the superior caval-atrial junction. A post procedure spot fluoroscopic was obtained. The catheter easily aspirated and flushed and was sutured in place. A dressing was placed. The patient tolerated the procedure well without immediate post procedural complication. FINDINGS: After catheter placement, the tip lies within the superior cavoatrial junction. The catheter aspirates and flushes normally and is ready for immediate use. IMPRESSION: Successful ultrasound and fluoroscopic guided placement of a right brachial vein approach, 37 cm, 5 French, dual lumen  PICC with tip at the superior caval-atrial junction. The PICC line is ready for immediate use. Electronically Signed   By: Sandi Mariscal M.D.   On: 08/16/2018 16:49    Anti-infectives: Anti-infectives (From admission, onward)   Start     Dose/Rate Route Frequency Ordered Stop   08/11/18 0200  cefTRIAXone (ROCEPHIN) 1 g in sodium chloride 0.9 % 100 mL IVPB  Status:  Discontinued     1 g 200 mL/hr over 30 Minutes Intravenous Every 24 hours 08/10/18 1921 08/16/18 1306   08/10/18 1800  aztreonam (AZACTAM) 2 g in sodium chloride 0.9 % 100 mL IVPB     2 g 200 mL/hr over 30 Minutes Intravenous  Once 08/10/18 1756 08/10/18 2011       Assessment/Plan Quadriplegia s/p trach,PEG and SP catheter DM Depression Chronic hypotension COPD Sacral decubitus ulcer UTI - on rocephin  SBO vs ileus, rectus sheath hematoma - CT scan 7/21 shows SBO, large left rectus sheath hematoma17.5 x 13.0 x 7.5 cm - film this AM with resolution in sbo and contrast in colon  - had some bowel function overnight - patient is mildly distended feeling in R abdomen, L abdomen feels firm from hematoma -  can try some clears today - if patient has worsened pain/nausea or distention would put G-tube to gravity - suppository prn   FEN: CLD; TPN VTE: SCDs ID: no current abx   LOS: 8 days    Angela Page , [facility] Surgery 08/18/2018, 9:01 AM Pager: [phone] Consults: [phone] 7:00 AM - 4:30 PM M, W-F 7:00 AM - 11:30 AM Tues, Sat, Sun

## 2018-08-19 ENCOUNTER — Inpatient Hospital Stay (HOSPITAL_COMMUNITY): Payer: Medicare Other

## 2018-08-19 LAB — GLUCOSE, CAPILLARY
Glucose-Capillary: 111 mg/dL — ABNORMAL HIGH (ref 70–99)
Glucose-Capillary: 116 mg/dL — ABNORMAL HIGH (ref 70–99)
Glucose-Capillary: 141 mg/dL — ABNORMAL HIGH (ref 70–99)
Glucose-Capillary: 147 mg/dL — ABNORMAL HIGH (ref 70–99)
Glucose-Capillary: 160 mg/dL — ABNORMAL HIGH (ref 70–99)
Glucose-Capillary: 86 mg/dL (ref 70–99)

## 2018-08-19 LAB — URINALYSIS, ROUTINE W REFLEX MICROSCOPIC
Bilirubin Urine: NEGATIVE
Glucose, UA: NEGATIVE mg/dL
Ketones, ur: NEGATIVE mg/dL
Nitrite: NEGATIVE
Protein, ur: 30 mg/dL — AB
Specific Gravity, Urine: 1.011 (ref 1.005–1.030)
pH: 7 (ref 5.0–8.0)

## 2018-08-19 LAB — COMPREHENSIVE METABOLIC PANEL
ALT: 12 U/L (ref 0–44)
AST: 14 U/L — ABNORMAL LOW (ref 15–41)
Albumin: 3.3 g/dL — ABNORMAL LOW (ref 3.5–5.0)
Alkaline Phosphatase: 83 U/L (ref 38–126)
Anion gap: 8 (ref 5–15)
BUN: 17 mg/dL (ref 6–20)
CO2: 25 mmol/L (ref 22–32)
Calcium: 8.8 mg/dL — ABNORMAL LOW (ref 8.9–10.3)
Chloride: 102 mmol/L (ref 98–111)
Creatinine, Ser: 0.33 mg/dL — ABNORMAL LOW (ref 0.44–1.00)
GFR calc Af Amer: >60 mL/min (ref >60)
GFR calc non Af Amer: >60 mL/min (ref >60)
Glucose, Bld: 108 mg/dL — ABNORMAL HIGH (ref 70–99)
Potassium: 4.1 mmol/L (ref 3.5–5.1)
Sodium: 135 mmol/L (ref 135–145)
Total Bilirubin: 0.4 mg/dL (ref 0.3–1.2)
Total Protein: 7.1 g/dL (ref 6.5–8.1)

## 2018-08-19 LAB — CBC
HCT: 31.5 % — ABNORMAL LOW (ref 36.0–46.0)
Hemoglobin: 9.6 g/dL — ABNORMAL LOW (ref 12.0–15.0)
MCH: 28.4 pg (ref 26.0–34.0)
MCHC: 30.5 g/dL (ref 30.0–36.0)
MCV: 93.2 fL (ref 80.0–100.0)
Platelets: 329 10*3/uL (ref 150–400)
RBC: 3.38 MIL/uL — ABNORMAL LOW (ref 3.87–5.11)
RDW: 17.1 % — ABNORMAL HIGH (ref 11.5–15.5)
WBC: 11.4 10*3/uL — ABNORMAL HIGH (ref 4.0–10.5)
nRBC: 0 % (ref 0.0–0.2)

## 2018-08-19 LAB — MAGNESIUM: Magnesium: 2 mg/dL (ref 1.7–2.4)

## 2018-08-19 LAB — PHOSPHORUS: Phosphorus: 3.6 mg/dL (ref 2.5–4.6)

## 2018-08-19 MED ORDER — POLYETHYLENE GLYCOL 3350 17 G PO PACK
17.0000 g | PACK | Freq: Every day | ORAL | Status: DC
  Administered 2018-08-19 – 2018-08-20 (×2): 17 g via ORAL
  Filled 2018-08-19: qty 1

## 2018-08-19 MED ORDER — TRAVASOL 10 % IV SOLN
INTRAVENOUS | Status: DC
  Administered 2018-08-19: 17:00:00 via INTRAVENOUS
  Filled 2018-08-19: qty 432

## 2018-08-19 MED ORDER — BISACODYL 10 MG RE SUPP
10.0000 mg | Freq: Once | RECTAL | Status: AC
  Administered 2018-08-19: 11:00:00 10 mg via RECTAL
  Filled 2018-08-19: qty 1

## 2018-08-19 NOTE — Progress Notes (Signed)
[facility] Progress Note     Subjective: CC: abdominal pain Abdominal pain still in upper abdomen, although patient reports it is improving. Still feeling a little bloated but not worse. No more BMs. Denies nausea. Concerned about low grade fever overnight.   Objective: Vital signs in last 24 hours: Temp:  [99.7 F (37.6 C)-100.5 F (38.1 C)] 100 F (37.8 C) (07/30 0610) Pulse Rate:  [82-90] 86 (07/30 0610) Resp:  [16-20] 18 (07/30 0610) BP: (123-162)/(77-97) 151/97 (07/30 0610) SpO2:  [96 %-100 %] 97 % (07/30 0610) FiO2 (%):  [28 %] 28 % (07/30 0422) Weight:  [45 kg] 45 kg (07/30 0615) Last BM Date: 08/17/18  Intake/Output from previous day: 07/29 0701 - 07/30 0700 In: 2108.9 [P.O.:780; I.V.:1318.9] Out: 2500 [Urine:2500] Intake/Output this shift: No intake/output data recorded.  PE: Gen:  Alert, NAD, pleasant Neck: trach c/d/i; PMV in place Card:  Regular rate and rhythm Pulm:  Normal effort, clear to auscultation bilaterally Abd: Soft, non-tender, mildly distended, +BS, G tube c/d/i, firmness to left hemi-abdomen from rectus hematoma Skin: warm and dry, no rashes  Psych: A&Ox3   Lab Results:  Recent Labs    08/17/18 0506  WBC 8.5  HGB 9.0*  HCT 30.4*  PLT 292   BMET Recent Labs    08/18/18 0441 08/19/18 0416  NA 135 135  K 4.2 4.1  CL 103 102  CO2 23 25  GLUCOSE 135* 108*  BUN 13 17  CREATININE 0.30* 0.33*  CALCIUM 8.7* 8.8*   PT/INR No results for input(s): LABPROT, INR in the last 72 hours. CMP     Component Value Date/Time   NA 135 08/19/2018 0416   NA 136 (A) 09/23/2016   K 4.1 08/19/2018 0416   CL 102 08/19/2018 0416   CL 85 09/23/2016   CO2 25 08/19/2018 0416   GLUCOSE 108 (H) 08/19/2018 0416   BUN 17 08/19/2018 0416   BUN 35 (A) 09/23/2016   CREATININE 0.33 (L) 08/19/2018 0416   CALCIUM 8.8 (L) 08/19/2018 0416   CALCIUM 10.4 09/23/2016   PROT 7.1 08/19/2018 0416   PROT 8.1 09/23/2016   ALBUMIN 3.3 (L) 08/19/2018  0416   ALBUMIN 4.3 09/23/2016   AST 14 (L) 08/19/2018 0416   ALT 12 08/19/2018 0416   ALKPHOS 83 08/19/2018 0416   ALKPHOS 195 09/23/2016   BILITOT 0.4 08/19/2018 0416   BILITOT 0.2 09/23/2016   GFRNONAA >60 08/19/2018 0416   GFRAA >60 08/19/2018 0416   Lipase     Component Value Date/Time   LIPASE 19 08/10/2018 1440       Studies/Results: Dg Abd Portable 1v  Result Date: 08/18/2018 CLINICAL DATA:  Abdominal distension. History of small-bowel obstruction. EXAM: PORTABLE ABDOMEN - 1 VIEW COMPARISON:  Multiple previous abdominal films. Most recent is yesterday. FINDINGS: Most of the contrast has now passed into the colon. There are some persistent air-filled loops of small bowel but no significant distension. No free air. Stable position of the feeding gastrostomy tube. IMPRESSION: Interval resolution of small-bowel obstruction with passage of contrast into the colon. Electronically Signed   By: Marijo Sanes M.D.   On: 08/18/2018 08:16    Anti-infectives: Anti-infectives (From admission, onward)   Start     Dose/Rate Route Frequency Ordered Stop   08/11/18 0200  cefTRIAXone (ROCEPHIN) 1 g in sodium chloride 0.9 % 100 mL IVPB  Status:  Discontinued     1 g 200 mL/hr over 30 Minutes Intravenous Every 24 hours 08/10/18  1921 08/16/18 1306   08/10/18 1800  aztreonam (AZACTAM) 2 g in sodium chloride 0.9 % 100 mL IVPB     2 g 200 mL/hr over 30 Minutes Intravenous  Once 08/10/18 1756 08/10/18 2011       Assessment/Plan Quadriplegia s/p trach,PEG and SP catheter DM Depression Chronic hypotension COPD Sacral decubitus ulcer UTI - rocephin 7/22>7/27  SBO vs ileus,rectus sheath hematoma - CT scan 7/21 shows SBO, large left rectus sheath hematoma17.5 x 13.0 x 7.5 cm - film this AM with resolution in sbo and contrast in colon  - tolerating CLD - patient is less distended feeling in R abdomen, L abdomen feels firm from hematoma - can try advancing to FLD, 1/2 TPN -  suppository today, add miralax as well Fever - low grade, check CBC, UA/Cx, CXR; further workup per primary   FEN: FLD; wean TPN VTE: SCDs ID: no current abx; Tmax 100.5  LOS: 9 days    Brigid Re , [facility] 08/19/2018, 8:40 AM Pager: [phone] Consults: [phone] 7:00 AM - 4:30 PM M, W-F 7:00 AM - 11:30 AM Tues, Sat, Sun

## 2018-08-19 NOTE — Progress Notes (Signed)
PT denies need for suctioning at this time. 

## 2018-08-19 NOTE — Plan of Care (Signed)
  Problem: Clinical Measurements: Goal: Diagnostic test results will improve Outcome: Progressing   Problem: Nutrition: Goal: Adequate nutrition will be maintained Outcome: Progressing   Problem: Elimination: Goal: Will not experience complications related to bowel motility Outcome: Progressing   Problem: Pain Managment: Goal: General experience of comfort will improve Outcome: Progressing

## 2018-08-19 NOTE — Progress Notes (Signed)
PROGRESS NOTE    Zohal Reny  XBM:[id number] DOB: 1970/07/04 DOA: 08/10/2018 PCP: Default, Provider, MD    Brief Narrative: 48 y.o.femalewith medical history significant oftraumatic C spine fracture resulting in quadriplegia s/p trach, peg, suprapubic catheter as well as COPD, chronic hypotension, T2DM, chronic pain, SSS s/p pacemaker, and chronic decubitus ulcers presenting with abdominal pain, worsening  With no vomiting until in ED.    Imaging concerning for SBO and rectus sheath hematoma. Labs with anemia. Surgery c/s. Labs, imaging. Abx for possible UTI. Patient was admitted, seen by surgery, being managed conservatively.  Abdominal x-ray shows resolution of SBO  Assessment & Plan:   Principal Problem:   SBO (small bowel obstruction) (HCC) Active Problems:   Quadriplegia (HCC)   Symptomatic anemia   Rectus sheath hematoma, initial encounter  Bowel obstruction versus ileus from rectus sheath hematoma abd X ray shows resolution of the SBO. Clears started by surgery and advance to full liquid today.  Appreciate surgery recommendations. Replete electrolytes as appropriate TPN started.    Rectus sheath hematoma CT of the abdomen shows hematoma 17.5 x 13.0 x 7.5 cm. Transfuse to keep hemoglobin greater than 7. Hemoglobin around 9 this am.  Patient received 2 units of PRBC so far.    Acute blood loss anemia probably secondary to rectal sheath hematoma Transfuse to keep hemoglobin greater than 7. Continue to monitor daily CBCs.   History of quadriplegia status post trach, PEG, suprapubic catheter secondary to traumatic C-spine fracture Continue with supportive care with trial of trach collar, supplemental oxygen as needed to keep sats greater than 90%, pulmonary toilet.   Metabolic acidosis Improving   Type 2 diabetes mellitus On TPN at this time continue with sliding scale insulin and monitor. CBG (last 3)  Recent Labs    08/19/18 0334 08/19/18  0741 08/19/18 1126  GLUCAP 116* 141* 86     Hypophosphatemia and hypokalemia Replaced as appropriate.   Abnormal UA Completed 5 days of antibiotics. Repeat cultures ordered.      Depression/insomnia Continue with Cymbalta and Wellbutrin.     Hypotension Resolved.    COPD Continue with home bronchodilators.     DVT prophylaxis: scd's Code Status:  Full code.  Family Communication: none at bedside.  Disposition Plan: Pending clinical improvement   Consultants:   General surgery   Procedures: None  Antimicrobials: Completed Rocephin  Subjective:  no new complaints.   Objective: Vitals:   08/19/18 0901 08/19/18 1133 08/19/18 1315 08/19/18 1502  BP:   (!) 141/92   Pulse: 87 85 80   Resp: 17 17 16    Temp:   98.4 F (36.9 C)   TempSrc:   Oral   SpO2: 96% 95% 100% 99%  Weight:      Height:        Intake/Output Summary (Last 24 hours) at 08/19/2018 1520 Last data filed at 08/19/2018 1316 Gross per 24 hour  Intake 1729.24 ml  Output 2650 ml  Net -920.76 ml   Filed Weights   08/17/18 0416 08/18/18 0416 08/19/18 0615  Weight: 45.4 kg 44.9 kg 45 kg    Examination:  General exam: Appears calm and comfortable status post trach, PEG Respiratory system: Clear to auscultation. Respiratory effort normal. Cardiovascular system: S1 & S2 heard, RRR. Gastrointestinal system: Abdomen is firm, distended, suprapubic catheter Central nervous system: Alert and oriented.  Quadriplegic Extremities: Quadriplegic Skin: Sacral decubitus ulcer Psychiatry: Mood & affect appropriate.     Data Reviewed: I have personally reviewed following labs  and imaging studies  CBC: Recent Labs  Lab 08/14/18 0910 08/15/18 0335 08/16/18 0424 08/17/18 0506 08/19/18 0958  WBC 8.9 8.7 7.5 8.5 11.4*  NEUTROABS  --   --  5.5  --   --   HGB 10.2* 8.9* 9.2* 9.0* 9.6*  HCT 35.4* 32.0* 30.9* 30.4* 31.5*  MCV 97.8 99.1 94.8 95.0 93.2  PLT 199 384 337 292 546   Basic  Metabolic Panel: Recent Labs  Lab 08/15/18 0335 08/16/18 0424 08/17/18 0506 08/18/18 0441 08/19/18 0416  NA 141 143 138 135 135  K 4.2 2.8* 3.2* 4.2 4.1  CL 112* 108 109 103 102  CO2 14* 23 23 23 25   GLUCOSE 63* 102* 146* 135* 108*  BUN 9 6 8 13 17   CREATININE 0.49 0.37* <0.30* 0.30* 0.33*  CALCIUM 8.8* 8.8* 8.4* 8.7* 8.8*  MG  --  1.7 1.7 2.0 2.0  PHOS  --  2.6 1.8* 3.0 3.6   GFR: Estimated Creatinine Clearance: 61.8 mL/min (A) (by C-G formula based on SCr of 0.33 mg/dL (L)). Liver Function Tests: Recent Labs  Lab 08/16/18 0424 08/19/18 0416  AST 10* 14*  ALT 10 12  ALKPHOS 79 83  BILITOT 0.8 0.4  PROT 6.8 7.1  ALBUMIN 3.0* 3.3*   No results for input(s): LIPASE, AMYLASE in the last 168 hours. No results for input(s): AMMONIA in the last 168 hours. Coagulation Profile: No results for input(s): INR, PROTIME in the last 168 hours. Cardiac Enzymes: No results for input(s): CKTOTAL, CKMB, CKMBINDEX, TROPONINI in the last 168 hours. BNP (last 3 results) No results for input(s): PROBNP in the last 8760 hours. HbA1C: No results for input(s): HGBA1C in the last 72 hours. CBG: Recent Labs  Lab 08/18/18 2117 08/19/18 0019 08/19/18 0334 08/19/18 0741 08/19/18 1126  GLUCAP 116* 160* 116* 141* 86   Lipid Profile: No results for input(s): CHOL, HDL, LDLCALC, TRIG, CHOLHDL, LDLDIRECT in the last 72 hours. Thyroid Function Tests: No results for input(s): TSH, T4TOTAL, FREET4, T3FREE, THYROIDAB in the last 72 hours. Anemia Panel: No results for input(s): VITAMINB12, FOLATE, FERRITIN, TIBC, IRON, RETICCTPCT in the last 72 hours. Sepsis Labs: No results for input(s): PROCALCITON, LATICACIDVEN in the last 168 hours.  Recent Results (from the past 240 hour(s))  Urine culture     Status: Abnormal   Collection Time: 08/10/18  2:06 PM   Specimen: Urine, Catheterized  Result Value Ref Range Status   Specimen Description   Final    Urine Performed at [facility] [address], [place], [place] [address]    Special Requests   Final    NONE Performed at [facility], [place] [address], [place], [place] [address]    Culture MULTIPLE SPECIES PRESENT, SUGGEST RECOLLECTION (A)  Final   Report Status 08/12/2018 FINAL  Final  Culture, blood (routine x 2)     Status: None   Collection Time: 08/10/18  2:42 PM   Specimen: BLOOD  Result Value Ref Range Status   Specimen Description   Final    BLOOD RIGHT ARM Performed at [facility] [address], [place], [place] [address]    Special Requests   Final    BOTTLES DRAWN AEROBIC AND ANAEROBIC Blood Culture adequate volume Performed at [facility] [address], [place], [place] [address]    Culture   Final    NO GROWTH 5 DAYS Performed at [facility], [place] [address],  [place], [place] [address]    Report Status 08/15/2018 FINAL  Final  Culture, blood (routine x 2)     Status: None   Collection Time: 08/10/18  3:29 PM   Specimen: BLOOD LEFT HAND  Result Value Ref Range Status   Specimen Description   Final    BLOOD LEFT HAND Performed at [facility] [address], [place], [place] [address]    Special Requests   Final    BOTTLES DRAWN AEROBIC AND ANAEROBIC Blood Culture adequate volume Performed at [facility] [address], [place], [place] [address]    Culture   Final    NO GROWTH 5 DAYS Performed at [facility], [place] [address] [place], [place] [address]    Report Status 08/15/2018 FINAL  Final  SARS Coronavirus 2 (CEPHEID - Performed in [facility] hospital lab), Hosp Order     Status: None   Collection Time: 08/10/18  6:20 PM   Specimen: Nasopharyngeal Swab  Result Value Ref Range Status   SARS Coronavirus 2 NEGATIVE NEGATIVE Final    Comment: (NOTE) If result is NEGATIVE SARS-CoV-2 target nucleic acids are NOT DETECTED. The SARS-CoV-2 RNA is generally  detectable in upper and lower  respiratory specimens during the acute phase of infection. The lowest  concentration of SARS-CoV-2 viral copies this assay can detect is 250  copies / mL. A negative result does not preclude SARS-CoV-2 infection  and should not be used as the sole basis for treatment or other  patient management decisions.  A negative result may occur with  improper specimen collection / handling, submission of specimen other  than nasopharyngeal swab, presence of viral mutation(s) within the  areas targeted by this assay, and inadequate number of viral copies  (<250 copies / mL). A negative result must be combined with clinical  observations, patient history, and epidemiological information. If result is POSITIVE SARS-CoV-2 target nucleic acids are DETECTED. The SARS-CoV-2 RNA is generally detectable in upper and lower  respiratory specimens dur ing the acute phase of infection.  Positive  results are indicative of active infection with SARS-CoV-2.  Clinical  correlation with patient history and other diagnostic information is  necessary to determine patient infection status.  Positive results do  not rule out bacterial infection or co-infection with other viruses. If result is PRESUMPTIVE POSTIVE SARS-CoV-2 nucleic acids MAY BE PRESENT.   A presumptive positive result was obtained on the submitted specimen  and confirmed on repeat testing.  While 2019 novel coronavirus  (SARS-CoV-2) nucleic acids may be present in the submitted sample  additional confirmatory testing may be necessary for epidemiological  and / or clinical management purposes  to differentiate between  SARS-CoV-2 and other Sarbecovirus currently known to infect humans.  If clinically indicated additional testing with an alternate test  methodology [id number]) is advised. The SARS-CoV-2 RNA is generally  detectable in upper and lower respiratory sp ecimens during the acute  phase of infection. The  expected result is Negative. Fact Sheet for Patients:  StrictlyIdeas.no Fact Sheet for Healthcare Providers: BankingDealers.co.za This test is not yet approved or cleared by the [place] FDA and has been authorized for detection and/or diagnosis of SARS-CoV-2 by FDA under an Emergency Use Authorization (EUA).  This EUA will remain in effect (meaning this test can be used) for the duration of the COVID-19 declaration under Section 564(b)(1) of the Act, 21 U.S.C. section 360bbb-3(b)(1), unless the authorization is terminated or revoked sooner. Performed at  [facility], [place] [address], [place], [place] [address]   MRSA PCR Screening     Status: Abnormal   Collection Time: 08/11/18  1:42 AM   Specimen: Nasal Mucosa; Nasopharyngeal  Result Value Ref Range Status   MRSA by PCR POSITIVE (A) NEGATIVE Final    Comment:        The GeneXpert MRSA Assay (FDA approved for NASAL specimens only), is one component of a comprehensive MRSA colonization surveillance program. It is not intended to diagnose MRSA infection nor to guide or monitor treatment for MRSA infections. RESULT CALLED TO, READ BACK BY AND VERIFIED WITH: ELLIS,C @ 0425 ON [id number] BY POTEAT,S Performed at [facility] [address], [place], [place] [address]          Radiology Studies: Dg Chest Port 1 View  Result Date: 08/19/2018 CLINICAL DATA:  Low-grade fever EXAM: PORTABLE CHEST 1 VIEW COMPARISON:  Nine days ago FINDINGS: Tracheostomy tube in place. Right PICC with tip at the distal SVC. Dual-chamber pacer leads from the left in stable position. Patient's right hand overlaps the right lung base. There is no edema, consolidation, effusion, or pneumothorax. Normal heart size and mediastinal contours. IMPRESSION: No evidence of pneumonia or other acute process. Electronically Signed   By: Monte Fantasia M.D.   On: 08/19/2018 11:50    Dg Abd Portable 1v  Result Date: 08/18/2018 CLINICAL DATA:  Abdominal distension. History of small-bowel obstruction. EXAM: PORTABLE ABDOMEN - 1 VIEW COMPARISON:  Multiple previous abdominal films. Most recent is yesterday. FINDINGS: Most of the contrast has now passed into the colon. There are some persistent air-filled loops of small bowel but no significant distension. No free air. Stable position of the feeding gastrostomy tube. IMPRESSION: Interval resolution of small-bowel obstruction with passage of contrast into the colon. Electronically Signed   By: Marijo Sanes M.D.   On: 08/18/2018 08:16        Scheduled Meds: . sodium chloride   Intravenous Once  . baclofen  5 mg Oral TID  . buPROPion  150 mg Oral Daily  . DULoxetine  30 mg Oral BID  . gabapentin  300 mg Oral TID  . insulin aspart  0-9 Units Subcutaneous Q4H  . LORazepam  0.5 mg Intravenous Once  . polyethylene glycol  17 g Oral Daily  . sodium chloride flush  10-40 mL Intracatheter Q12H  . umeclidinium bromide  1 puff Inhalation Daily  . zolpidem  2.5 mg Oral QHS   Continuous Infusions: . TPN ADULT (ION) 55 mL/hr at 08/18/18 1800  . TPN ADULT (ION)       LOS: 9 days    Time spent: 30 minutes.     Hosie Poisson, MD [facility] Pager [phone]   If 7PM-7AM, please contact night-coverage [email or web address] Password [email or web address] 08/19/2018, 3:20 PM

## 2018-08-19 NOTE — Progress Notes (Signed)
PT has denied need for suctioning times 2 thus far today.

## 2018-08-19 NOTE — Progress Notes (Addendum)
PHARMACY - ADULT TOTAL PARENTERAL NUTRITION CONSULT NOTE   Pharmacy Consult for TPN Indication:  SBO vs ileus from rectus sheath hematoma  HPI:  48 y/o F with hx of quadriplegia from traumatic C spine fracture admitted 08/10/18 with large rectus sheath hematoma and SBO vs ileus, which has persisted. Orders received 08/15/18 to begin TPN with pharmacy to assist.    Significant Events:  7/26: VAS Team was unable to place PICC due to patient-specific factors (see chart note).  They report IR will need to place PICC and that the attending MD was notified of this. 7/27: PICC line placed 7/29: AXR: Interval resolution of small bowel obstruction with passage of contrast into the colon. 7/30: Per CCS, TPN to 1/2 rate today, try advancing to full liquid diet   TPN Access: PICC TPN start date: 08/16/2018  Patient Measurements: Estimated body mass index is 18.15 kg/m as calculated from the following:   Height as of this encounter: 5\' 2"  (1.575 m).   Weight as of this encounter: 99 lb 3.3 oz (45 kg).  Intake/Output:  Intake/Output Summary (Last 24 hours) at 08/19/2018 0938 Last data filed at 08/19/2018 0700 Gross per 24 hour  Intake 2108.93 ml  Output 2500 ml  Net -391.07 ml    Insulin Requirements: PTA regimen for DM2 was reportedly Basaglar 5 units qHS + Novolog SSI H9Q along with Trulicity 1.5 mg SQ weekly. As inpatient has been receiving only Novolog SSI sensitive scale q4h. Received 4 units in past 24 hours via sliding scale.   Current Nutrition:  TPN. Advance to full liquid diet today per surgery. Patient currently off her usual tube feedings.  IVF: D5NS at 20 ml/hr   Recent Labs    08/18/18 0441 08/19/18 0416  NA 135 135  K 4.2 4.1  CL 103 102  CO2 23 25  GLUCOSE 135* 108*  BUN 13 17  CREATININE 0.30* 0.33*  CALCIUM 8.7* 8.8*  PHOS 3.0 3.6  MG 2.0 2.0  ALBUMIN  --  3.3*  ALKPHOS  --  83  AST  --  14*  ALT  --  12  BILITOT  --  0.4    ASSESSMENT                                                                                                            Glucose - CBGs 116-160 (goal < 150)  Electrolytes - All now WNL  Renal - SCr low, BUN WNL  LFTs -  All below ULN   TGs - slightly elevated at baseline, 172 (7/27)  Prealbumin - 14.2 at baseline  NUTRITIONAL GOALS  RD recs (7/27): Kcal:  1590-1815 kcal Protein:  90-115 grams (2-2.5 grams) Fluid:  >/= 1.7 L/day  Custom TPN at goal rate of 70 ml/hr provides: - 100.8 g/day Protein (60 g/L) -  50.4 g/day Lipid  (30 g/L) -  252 g/day Dextrose (15 %) -  1764 Kcal/day   PLAN        D/C IV fluids per CCS                                                                                                                 At 1800 today:  Decrease TPN rate to 30 ml/hr (~ 1/2 rate at request of CCS).   Electrolytes in TPN: Continue increased K+ at 60 mEq/L, continue increased Phos at 20 mmol/L, continue increased Mag at 7.5 mEq/L, other lytes at standard concentrations, Cl:Ac ratio 1:1  TPN to contain standard multivitamins daily. Trace elements only on MWF due to Producer, television/film/video.  Continue sensitive SSI q4h.   TPN lab panels on Mondays & Thursdays.  F/u diet advancement and for ability to stop TPN.    Lindell Spar, PharmD, BCPS Clinical Pharmacist  08/19/2018  9:38 AM

## 2018-08-20 LAB — GLUCOSE, CAPILLARY
Glucose-Capillary: 118 mg/dL — ABNORMAL HIGH (ref 70–99)
Glucose-Capillary: 118 mg/dL — ABNORMAL HIGH (ref 70–99)
Glucose-Capillary: 125 mg/dL — ABNORMAL HIGH (ref 70–99)
Glucose-Capillary: 129 mg/dL — ABNORMAL HIGH (ref 70–99)
Glucose-Capillary: 132 mg/dL — ABNORMAL HIGH (ref 70–99)
Glucose-Capillary: 132 mg/dL — ABNORMAL HIGH (ref 70–99)

## 2018-08-20 LAB — PHOSPHORUS
Phosphorus: 3.7 mg/dL (ref 2.5–4.6)
Phosphorus: 3.9 mg/dL (ref 2.5–4.6)

## 2018-08-20 LAB — BASIC METABOLIC PANEL
Anion gap: 7 (ref 5–15)
BUN: 15 mg/dL (ref 6–20)
CO2: 26 mmol/L (ref 22–32)
Calcium: 8.9 mg/dL (ref 8.9–10.3)
Chloride: 102 mmol/L (ref 98–111)
Creatinine, Ser: 0.34 mg/dL — ABNORMAL LOW (ref 0.44–1.00)
GFR calc Af Amer: >60 mL/min (ref >60)
GFR calc non Af Amer: >60 mL/min (ref >60)
Glucose, Bld: 145 mg/dL — ABNORMAL HIGH (ref 70–99)
Potassium: 4 mmol/L (ref 3.5–5.1)
Sodium: 135 mmol/L (ref 135–145)

## 2018-08-20 LAB — URINE CULTURE: Culture: NO GROWTH

## 2018-08-20 LAB — MAGNESIUM
Magnesium: 1.8 mg/dL (ref 1.7–2.4)
Magnesium: 1.8 mg/dL (ref 1.7–2.4)

## 2018-08-20 MED ORDER — POLYETHYLENE GLYCOL 3350 17 G PO PACK
17.0000 g | PACK | Freq: Two times a day (BID) | ORAL | Status: DC
  Administered 2018-08-20 – 2018-08-24 (×7): 17 g via ORAL
  Filled 2018-08-20 (×7): qty 1

## 2018-08-20 MED ORDER — BISACODYL 10 MG RE SUPP: 10.0000 mg | Freq: Every day | RECTAL | Status: DC | PRN

## 2018-08-20 MED ORDER — GLUCERNA 1.2 CAL PO LIQD
400.0000 mL | ORAL | Status: DC
  Administered 2018-08-20 – 2018-08-22 (×3): 400 mL
  Filled 2018-08-20 (×4): qty 474

## 2018-08-20 MED ORDER — DOCUSATE SODIUM 50 MG/5ML PO LIQD
100.0000 mg | Freq: Every day | ORAL | Status: DC
  Administered 2018-08-20 – 2018-08-23 (×4): 100 mg
  Filled 2018-08-20 (×5): qty 10

## 2018-08-20 MED ORDER — TRAVASOL 10 % IV SOLN
INTRAVENOUS | Status: AC
  Filled 2018-08-20: qty 216

## 2018-08-20 MED ORDER — PRO-STAT SUGAR FREE PO LIQD
30.0000 mL | Freq: Three times a day (TID) | ORAL | Status: DC
  Administered 2018-08-21: 30 mL
  Filled 2018-08-20 (×7): qty 30

## 2018-08-20 NOTE — Progress Notes (Signed)
Patient's tube feeding was resumed today. The order says to start at 7ml/hr but Dr. Karleen Hampshire wrote a care order/instruction for RN to start tube feeding at 65ml/hr. Patient felt like that was a better place to start as well because she said she's had multiple issues with the tube getting clogged in the past. Will continue to monitor patient. Timoteo Gaul, RN

## 2018-08-20 NOTE — Care Management Important Message (Signed)
Important Message  Patient Details IM Letter given to Rhea Pink SW to present to the Patient Name: Angela Page MRN: [id number] Date of Birth: Dec 17, 1970   Medicare Important Message Given:  Yes     Kerin Salen 08/20/2018, 11:41 AMImportant Message  Patient Details  Name: Angela Page MRN: [id number] Date of Birth: 1970/04/19   Medicare Important Message Given:  Yes     Kerin Salen 08/20/2018, 11:41 AM

## 2018-08-20 NOTE — Progress Notes (Signed)
Pharmacy Note for TPN:  Received orders to d/c TPN per CCS  Plan:  Decrease TPN to 1/2 rate for 2 hours, then discontinue.  Discontinue TPN labs and associated nursing care orders.  Have continued SSI as previously ordered by MD prior to initiation of TPN, as patient is a diabetic (MD to address moving forward).   Lindell Spar, PharmD, BCPS Clinical Pharmacist  08/20/2018 10:34 AM

## 2018-08-20 NOTE — Plan of Care (Signed)
  Problem: Clinical Measurements: Goal: Will remain free from infection Outcome: Progressing Goal: Diagnostic test results will improve Outcome: Progressing Goal: Cardiovascular complication will be avoided Outcome: Progressing   Problem: Activity: Goal: Risk for activity intolerance will decrease Outcome: Progressing   Problem: Nutrition: Goal: Adequate nutrition will be maintained Outcome: Progressing   Problem: Coping: Goal: Level of anxiety will decrease Outcome: Progressing   Problem: Elimination: Goal: Will not experience complications related to bowel motility Outcome: Progressing   Problem: Pain Managment: Goal: General experience of comfort will improve Outcome: Progressing   Problem: Safety: Goal: Ability to remain free from injury will improve Outcome: Progressing   Problem: Skin Integrity: Goal: Risk for impaired skin integrity will decrease Outcome: Progressing

## 2018-08-20 NOTE — Progress Notes (Signed)
[facility] Progress Note     Subjective: CC: no complaints Abdominal pain continued to improve. No further fevers. Tolerated FLD and had 2 BM. Denies nausea.   Objective: Vital signs in last 24 hours: Temp:  [98 F (36.7 C)-98.8 F (37.1 C)] 98 F (36.7 C) (07/31 0517) Pulse Rate:  [78-85] 79 (07/31 0517) Resp:  [16-20] 20 (07/31 0517) BP: (136-155)/(92-98) 155/93 (07/31 0517) SpO2:  [95 %-100 %] 100 % (07/31 0755) FiO2 (%):  [28 %] 28 % (07/31 0755) Last BM Date: 08/17/18  Intake/Output from previous day: 07/30 0701 - 07/31 0700 In: 1960.4 [P.O.:880; I.V.:1080.4] Out: 2925 [Urine:2825; Drains:100] Intake/Output this shift: No intake/output data recorded.  PE: Gen: Alert, NAD, pleasant Neck: trach c/d/i; PMV in place Card: Regular rate and rhythm Pulm: Normal effort, clear to auscultation bilaterally Abd: Soft, non-tender,mildly distended, +BS, G tube c/d/i, firmness to left hemi-abdomen from rectus hematoma Skin: warm and dry, no rashes  Psych: A&Ox3   Lab Results:  Recent Labs    08/19/18 0958  WBC 11.4*  HGB 9.6*  HCT 31.5*  PLT 329   BMET Recent Labs    08/18/18 0441 08/19/18 0416  NA 135 135  K 4.2 4.1  CL 103 102  CO2 23 25  GLUCOSE 135* 108*  BUN 13 17  CREATININE 0.30* 0.33*  CALCIUM 8.7* 8.8*   PT/INR No results for input(s): LABPROT, INR in the last 72 hours. CMP     Component Value Date/Time   NA 135 08/19/2018 0416   NA 136 (A) 09/23/2016   K 4.1 08/19/2018 0416   CL 102 08/19/2018 0416   CL 85 09/23/2016   CO2 25 08/19/2018 0416   GLUCOSE 108 (H) 08/19/2018 0416   BUN 17 08/19/2018 0416   BUN 35 (A) 09/23/2016   CREATININE 0.33 (L) 08/19/2018 0416   CALCIUM 8.8 (L) 08/19/2018 0416   CALCIUM 10.4 09/23/2016   PROT 7.1 08/19/2018 0416   PROT 8.1 09/23/2016   ALBUMIN 3.3 (L) 08/19/2018 0416   ALBUMIN 4.3 09/23/2016   AST 14 (L) 08/19/2018 0416   ALT 12 08/19/2018 0416   ALKPHOS 83 08/19/2018 0416   ALKPHOS  195 09/23/2016   BILITOT 0.4 08/19/2018 0416   BILITOT 0.2 09/23/2016   GFRNONAA >60 08/19/2018 0416   GFRAA >60 08/19/2018 0416   Lipase     Component Value Date/Time   LIPASE 19 08/10/2018 1440       Studies/Results: Dg Chest Port 1 View  Result Date: 08/19/2018 CLINICAL DATA:  Low-grade fever EXAM: PORTABLE CHEST 1 VIEW COMPARISON:  Nine days ago FINDINGS: Tracheostomy tube in place. Right PICC with tip at the distal SVC. Dual-chamber pacer leads from the left in stable position. Patient's right hand overlaps the right lung base. There is no edema, consolidation, effusion, or pneumothorax. Normal heart size and mediastinal contours. IMPRESSION: No evidence of pneumonia or other acute process. Electronically Signed   By: Monte Fantasia M.D.   On: 08/19/2018 11:50    Anti-infectives: Anti-infectives (From admission, onward)   Start     Dose/Rate Route Frequency Ordered Stop   08/11/18 0200  cefTRIAXone (ROCEPHIN) 1 g in sodium chloride 0.9 % 100 mL IVPB  Status:  Discontinued     1 g 200 mL/hr over 30 Minutes Intravenous Every 24 hours 08/10/18 1921 08/16/18 1306   08/10/18 1800  aztreonam (AZACTAM) 2 g in sodium chloride 0.9 % 100 mL IVPB     2 g 200 mL/hr over  30 Minutes Intravenous  Once 08/10/18 1756 08/10/18 2011       Assessment/Plan Quadriplegia s/p trach,PEG and SP catheter DM Depression Chronic hypotension COPD Sacral decubitus ulcer UTI - rocephin 7/22>7/27; repeat cx sent 7/30  SBO vs ileus,rectus sheath hematoma -CT scan 7/21 shows SBO, large left rectus sheath hematoma17.5 x 13.0 x 7.5 cm - film this AM with resolution in sbo and contrast in colon  - tolerating FLD -patient is less distended feeling in R abdomen, L abdomen feels firm from hematoma - can try advancing to puree diet and restarting some TF - would start with trickle TF and advance as tolerated - d/c TPN  - suppository prn; increase home miralax to BID and add some colace Fever -  afebrile last 24h, urine cx pending   FEN: puree diet, TF; d/c TPN  VTE: SCDs ID: no current abx; afebrile   LOS: 10 days    Brigid Re , [facility] 08/20/2018, 9:53 AM Pager: [phone] Consults: [phone] 7:00 AM - 4:30 PM M, W-F 7:00 AM - 11:30 AM Tues, Sat, Sun

## 2018-08-20 NOTE — Progress Notes (Signed)
Nutrition Follow-up  DOCUMENTATION CODES:   Non-severe (moderate) malnutrition in context of chronic illness, Underweight  INTERVENTION:   -Initiate Glucerna 1.2 @ 20 ml/hr x 20 hours (1400-1000) via G-tube.  -Advance by 10 ml every 24 hours to goal of 57 ml/hr x 20 hours. -30 ml Prostat TID (or appropriate equivalent). -At goal this will provide 1668 kcal, 113g protein and 917 ml H2O.  NUTRITION DIAGNOSIS:   Moderate Malnutrition related to chronic illness as evidenced by moderate fat depletion, moderate muscle depletion, severe muscle depletion.  Ongoing.  GOAL:   Patient will meet greater than or equal to 90% of their needs  Not meeting currently, transitioning to TF from TPN.  MONITOR:   PO intake, Labs, Weight trends, TF tolerance, Skin, I & O's  REASON FOR ASSESSMENT:   Consult Enteral/tube feeding initiation and management  ASSESSMENT:   48 y.o. female with medical history significant of traumatic cervical spine fracture resulting in quadriplegia s/p trach, PEG, suprapubic catheter; COPD, chronic hypotension, type 2 DM, chronic pain, SSS s/p pacemaker, and chronic decubitus ulcers. She presented to the ED on 7/21 reporting abdominal pain which began the night before when her TF was started. Pain worsened and she developed nausea and abdominal distention. She denied any episodes of vomiting PTA and that she continued to have BMs.  **RD working remotely**  TPN discontinuing today. SBO has resolved and pt is having BMs. Surgery has consulted to start trickle feeds of TF. Pt also on dysphagia 1 diet, consuming 75-85% of meals at this time.   Will resume trickle feeds of Glucerna 1.2 as this is the formula she receives at her nursing home and tolerated PTA. Will run for 20 hours (1400-1000) with Prostat TID. Will advance slowly.   Weights have now stayed in the range of 99-100 lbs since 7/27.  Medications reviewed. Labs reviewed: CBGs: 125-132  Diet Order:   Diet  Order            DIET - DYS 1 Room service appropriate? Yes; Fluid consistency: Thin  Diet effective now              EDUCATION NEEDS:   No education needs have been identified at this time  Skin:  Skin Integrity Issues:: Other (Comment) Stage III: L IT Stage IV: sacrum  (per WOC note: large wound with three small wounds embedded within; red, nongranulating) Other: pin point wound (per WOC) to R IT  Last BM:  7/30  Height:   Ht Readings from Last 1 Encounters:  08/11/18 5\' 2"  (1.575 m)    Weight:   Wt Readings from Last 1 Encounters:  08/19/18 45 kg    Ideal Body Weight:  45 kg  BMI:  Body mass index is 18.15 kg/m.  Estimated Nutritional Needs:   Kcal:  1590-1815 kcal  Protein:  90-115 grams (2-2.5 grams)  Fluid:  >/= 1.7 L/day  Clayton Bibles, MS, RD, LDN [facility] Pager: [phone] After Hours Pager: [phone]

## 2018-08-20 NOTE — Progress Notes (Signed)
PROGRESS NOTE    Angela Page  GYI:[id number] DOB: 08-Aug-1970 DOA: 08/10/2018 PCP: Default, Provider, MD    Brief Narrative: 48 y.o.femalewith medical history significant oftraumatic C spine fracture resulting in quadriplegia s/p trach, peg, suprapubic catheter as well as COPD, chronic hypotension, T2DM, chronic pain, SSS s/p pacemaker, and chronic decubitus ulcers presenting with abdominal pain, worsening  With no vomiting until in ED.    Imaging concerning for SBO and rectus sheath hematoma. Labs with anemia. Surgery c/s. Labs, imaging. Abx for possible UTI. Patient was admitted, seen by surgery, being managed conservatively.  Abdominal x-ray shows resolution of SBO  Assessment & Plan:   Principal Problem:   SBO (small bowel obstruction) (HCC) Active Problems:   Quadriplegia (HCC)   Symptomatic anemia   Rectus sheath hematoma, initial encounter  Bowel obstruction versus ileus from rectus sheath hematoma abd X ray shows resolution of the SBO. Pt able to tolerate full liquids, tube feeds to be started today at low rate .  Appreciate surgery recommendations. Replete electrolytes as appropriate     Rectus sheath hematoma CT of the abdomen shows hematoma 17.5 x 13.0 x 7.5 cm. Transfuse to keep hemoglobin greater than 7. Hemoglobin around 9 this am.  Patient received 2 units of PRBC so far.    Acute blood loss anemia probably secondary to rectal sheath hematoma Transfuse to keep hemoglobin greater than 7. Continue to monitor daily CBCs.   History of quadriplegia status post trach, PEG, suprapubic catheter secondary to traumatic C-spine fracture Continue with supportive care with trial of trach collar, supplemental oxygen as needed to keep sats greater than 90%, pulmonary toilet.   Metabolic acidosis Improving   Type 2 diabetes mellitus Resume SSI.  CBG (last 3)  Recent Labs    08/20/18 0008 08/20/18 0404 08/20/18 0751  GLUCAP 118* 132* 125*      Hypophosphatemia and hypokalemia Replaced as appropriate.   Abnormal UA Completed 5 days of antibiotics. Repeat cultures ordered.      Depression/insomnia Continue with Cymbalta and Wellbutrin.     Hypotension Resolved.    COPD Continue with home bronchodilators.     DVT prophylaxis: scd's Code Status:  Full code.  Family Communication: none at bedside.  Disposition Plan: Pending clinical improvement, once able to tolerate the tube feeds , pt should be able to be discharge to [facility] in the next 24 to 48 hours.    Consultants:   General surgery   Procedures: None  Antimicrobials: Completed Rocephin  Subjective:  no new complaints. abd discomfort improving. No nausea, vomiting  BM this am.   Objective: Vitals:   08/19/18 2305 08/20/18 0305 08/20/18 0517 08/20/18 0755  BP:   (!) 155/93   Pulse: 80 78 79   Resp: 16 18 20    Temp:   98 F (36.7 C)   TempSrc:   Oral   SpO2: 99% 98% 100% 100%  Weight:      Height:        Intake/Output Summary (Last 24 hours) at 08/20/2018 1128 Last data filed at 08/20/2018 0527 Gross per 24 hour  Intake 1840.4 ml  Output 2300 ml  Net -459.6 ml   Filed Weights   08/17/18 0416 08/18/18 0416 08/19/18 0615  Weight: 45.4 kg 44.9 kg 45 kg    Examination:  General exam: Appears calm and comfortable status post trach, PEG Respiratory system: Clear to auscultation. Respiratory effort normal. No wheezing or rhonchi.  Cardiovascular system: S1 & S2 heard, RRR. Gastrointestinal system:  Abdomen is firm, distended, suprapubic catheter Central nervous system: Alert and oriented.  Quadriplegic Extremities: Quadriplegic Skin: Sacral decubitus ulcer Psychiatry: Mood & affect appropriate.     Data Reviewed: I have personally reviewed following labs and imaging studies  CBC: Recent Labs  Lab 08/14/18 0910 08/15/18 0335 08/16/18 0424 08/17/18 0506 08/19/18 0958  WBC 8.9 8.7 7.5 8.5 11.4*  NEUTROABS  --   --   5.5  --   --   HGB 10.2* 8.9* 9.2* 9.0* 9.6*  HCT 35.4* 32.0* 30.9* 30.4* 31.5*  MCV 97.8 99.1 94.8 95.0 93.2  PLT 199 384 337 292 094   Basic Metabolic Panel: Recent Labs  Lab 08/15/18 0335 08/16/18 0424 08/17/18 0506 08/18/18 0441 08/19/18 0416  NA 141 143 138 135 135  K 4.2 2.8* 3.2* 4.2 4.1  CL 112* 108 109 103 102  CO2 14* 23 23 23 25   GLUCOSE 63* 102* 146* 135* 108*  BUN 9 6 8 13 17   CREATININE 0.49 0.37* <0.30* 0.30* 0.33*  CALCIUM 8.8* 8.8* 8.4* 8.7* 8.8*  MG  --  1.7 1.7 2.0 2.0  PHOS  --  2.6 1.8* 3.0 3.6   GFR: Estimated Creatinine Clearance: 61.8 mL/min (A) (by C-G formula based on SCr of 0.33 mg/dL (L)). Liver Function Tests: Recent Labs  Lab 08/16/18 0424 08/19/18 0416  AST 10* 14*  ALT 10 12  ALKPHOS 79 83  BILITOT 0.8 0.4  PROT 6.8 7.1  ALBUMIN 3.0* 3.3*   No results for input(s): LIPASE, AMYLASE in the last 168 hours. No results for input(s): AMMONIA in the last 168 hours. Coagulation Profile: No results for input(s): INR, PROTIME in the last 168 hours. Cardiac Enzymes: No results for input(s): CKTOTAL, CKMB, CKMBINDEX, TROPONINI in the last 168 hours. BNP (last 3 results) No results for input(s): PROBNP in the last 8760 hours. HbA1C: No results for input(s): HGBA1C in the last 72 hours. CBG: Recent Labs  Lab 08/19/18 1720 08/19/18 2011 08/20/18 0008 08/20/18 0404 08/20/18 0751  GLUCAP 147* 111* 118* 132* 125*   Lipid Profile: No results for input(s): CHOL, HDL, LDLCALC, TRIG, CHOLHDL, LDLDIRECT in the last 72 hours. Thyroid Function Tests: No results for input(s): TSH, T4TOTAL, FREET4, T3FREE, THYROIDAB in the last 72 hours. Anemia Panel: No results for input(s): VITAMINB12, FOLATE, FERRITIN, TIBC, IRON, RETICCTPCT in the last 72 hours. Sepsis Labs: No results for input(s): PROCALCITON, LATICACIDVEN in the last 168 hours.  Recent Results (from the past 240 hour(s))  Urine culture     Status: Abnormal   Collection Time: 08/10/18   2:06 PM   Specimen: Urine, Catheterized  Result Value Ref Range Status   Specimen Description   Final    Urine Performed at [facility] [address], [place], [place] [address]    Special Requests   Final    NONE Performed at [facility], [place] [address] [place], [place] [address]    Culture MULTIPLE SPECIES PRESENT, SUGGEST RECOLLECTION (A)  Final   Report Status 08/12/2018 FINAL  Final  Culture, blood (routine x 2)     Status: None   Collection Time: 08/10/18  2:42 PM   Specimen: BLOOD  Result Value Ref Range Status   Specimen Description   Final    BLOOD RIGHT ARM Performed at [facility] [address] [place],  [address]    Special Requests   Final    BOTTLES DRAWN AEROBIC AND ANAEROBIC Blood Culture adequate volume  Performed at [facility], [place] [address] [place], [place], [place] [address]    Culture   Final    NO GROWTH 5 DAYS Performed at [facility], [place] [address], [place], [place] [address]    Report Status 08/15/2018 FINAL  Final  Culture, blood (routine x 2)     Status: None   Collection Time: 08/10/18  3:29 PM   Specimen: BLOOD LEFT HAND  Result Value Ref Range Status   Specimen Description   Final    BLOOD LEFT HAND Performed at [facility] [address] [place], [place] [address]    Special Requests   Final    BOTTLES DRAWN AEROBIC AND ANAEROBIC Blood Culture adequate volume Performed at [facility] [address] [place], [place] [address]    Culture   Final    NO GROWTH 5 DAYS Performed at [facility], [place] [address], [place], [place] [address]    Report Status 08/15/2018 FINAL  Final  SARS Coronavirus 2 (CEPHEID - Performed in [facility] hospital lab), Hosp Order     Status: None   Collection Time: 08/10/18  6:20 PM   Specimen: Nasopharyngeal Swab  Result Value Ref Range Status   SARS Coronavirus 2  NEGATIVE NEGATIVE Final    Comment: (NOTE) If result is NEGATIVE SARS-CoV-2 target nucleic acids are NOT DETECTED. The SARS-CoV-2 RNA is generally detectable in upper and lower  respiratory specimens during the acute phase of infection. The lowest  concentration of SARS-CoV-2 viral copies this assay can detect is 250  copies / mL. A negative result does not preclude SARS-CoV-2 infection  and should not be used as the sole basis for treatment or other  patient management decisions.  A negative result may occur with  improper specimen collection / handling, submission of specimen other  than nasopharyngeal swab, presence of viral mutation(s) within the  areas targeted by this assay, and inadequate number of viral copies  (<250 copies / mL). A negative result must be combined with clinical  observations, patient history, and epidemiological information. If result is POSITIVE SARS-CoV-2 target nucleic acids are DETECTED. The SARS-CoV-2 RNA is generally detectable in upper and lower  respiratory specimens dur ing the acute phase of infection.  Positive  results are indicative of active infection with SARS-CoV-2.  Clinical  correlation with patient history and other diagnostic information is  necessary to determine patient infection status.  Positive results do  not rule out bacterial infection or co-infection with other viruses. If result is PRESUMPTIVE POSTIVE SARS-CoV-2 nucleic acids MAY BE PRESENT.   A presumptive positive result was obtained on the submitted specimen  and confirmed on repeat testing.  While 2019 novel coronavirus  (SARS-CoV-2) nucleic acids may be present in the submitted sample  additional confirmatory testing may be necessary for epidemiological  and / or clinical management purposes  to differentiate between  SARS-CoV-2 and other Sarbecovirus currently known to infect humans.  If clinically indicated additional testing with an alternate test  methodology [phone])  is advised. The SARS-CoV-2 RNA is generally  detectable in upper and lower respiratory sp ecimens during the acute  phase of infection. The expected result is Negative. Fact Sheet for Patients:  StrictlyIdeas.no Fact Sheet for Healthcare Providers: BankingDealers.co.za This test is not yet approved or cleared by the [place] FDA and has been authorized for detection and/or diagnosis of SARS-CoV-2 by FDA under an Emergency Use Authorization (EUA).  This EUA  will remain in effect (meaning this test can be used) for the duration of the COVID-19 declaration under Section 564(b)(1) of the Act, 21 U.S.C. section 360bbb-3(b)(1), unless the authorization is terminated or revoked sooner. Performed at [facility], [place] [address] [place], [place] [address]   MRSA PCR Screening     Status: Abnormal   Collection Time: 08/11/18  1:42 AM   Specimen: Nasal Mucosa; Nasopharyngeal  Result Value Ref Range Status   MRSA by PCR POSITIVE (A) NEGATIVE Final    Comment:        The GeneXpert MRSA Assay (FDA approved for NASAL specimens only), is one component of a comprehensive MRSA colonization surveillance program. It is not intended to diagnose MRSA infection nor to guide or monitor treatment for MRSA infections. RESULT CALLED TO, READ BACK BY AND VERIFIED WITH: ELLIS,C @ 0425 ON [id number] BY POTEAT,S Performed at [facility] [address] [place], [place] [address]   Culture, Urine     Status: None   Collection Time: 08/19/18 12:12 PM   Specimen: Urine, Catheterized  Result Value Ref Range Status   Specimen Description   Final    URINE, CATHETERIZED Performed at [facility] [place] [address] [place], [place] [address]    Special Requests   Final    NONE Performed at [facility], [place] [address], [place], [place] [address]    Culture   Final    NO GROWTH  Performed at [facility], [place] [address], [place], [place] [address]    Report Status 08/20/2018 FINAL  Final         Radiology Studies: Dg Chest Port 1 View  Result Date: 08/19/2018 CLINICAL DATA:  Low-grade fever EXAM: PORTABLE CHEST 1 VIEW COMPARISON:  Nine days ago FINDINGS: Tracheostomy tube in place. Right PICC with tip at the distal SVC. Dual-chamber pacer leads from the left in stable position. Patient's right hand overlaps the right lung base. There is no edema, consolidation, effusion, or pneumothorax. Normal heart size and mediastinal contours. IMPRESSION: No evidence of pneumonia or other acute process. Electronically Signed   By: Monte Fantasia M.D.   On: 08/19/2018 11:50        Scheduled Meds: . sodium chloride   Intravenous Once  . baclofen  5 mg Oral TID  . buPROPion  150 mg Oral Daily  . docusate  100 mg Per Tube Daily  . DULoxetine  30 mg Oral BID  . gabapentin  300 mg Oral TID  . insulin aspart  0-9 Units Subcutaneous Q4H  . LORazepam  0.5 mg Intravenous Once  . polyethylene glycol  17 g Oral BID  . sodium chloride flush  10-40 mL Intracatheter Q12H  . umeclidinium bromide  1 puff Inhalation Daily  . zolpidem  2.5 mg Oral QHS   Continuous Infusions: . TPN ADULT (ION) 15 mL/hr at 08/20/18 1039     LOS: 10 days    Time spent: 30 minutes.     Hosie Poisson, MD [facility] Pager [phone]   If 7PM-7AM, please contact night-coverage [email or web address] Password [email or web address] 08/20/2018, 11:28 AM

## 2018-08-20 NOTE — Plan of Care (Signed)
Pt appears to be less anxious now that she is able to take her anti-anxiety meds PO. She does however, ask for her IV and PO pain medications frequently.

## 2018-08-21 LAB — GLUCOSE, CAPILLARY
Glucose-Capillary: 115 mg/dL — ABNORMAL HIGH (ref 70–99)
Glucose-Capillary: 118 mg/dL — ABNORMAL HIGH (ref 70–99)
Glucose-Capillary: 124 mg/dL — ABNORMAL HIGH (ref 70–99)
Glucose-Capillary: 126 mg/dL — ABNORMAL HIGH (ref 70–99)
Glucose-Capillary: 129 mg/dL — ABNORMAL HIGH (ref 70–99)
Glucose-Capillary: 134 mg/dL — ABNORMAL HIGH (ref 70–99)
Glucose-Capillary: 167 mg/dL — ABNORMAL HIGH (ref 70–99)

## 2018-08-21 LAB — PHOSPHORUS
Phosphorus: 3.8 mg/dL (ref 2.5–4.6)
Phosphorus: 4.3 mg/dL (ref 2.5–4.6)

## 2018-08-21 LAB — MAGNESIUM
Magnesium: 1.9 mg/dL (ref 1.7–2.4)
Magnesium: 1.8 mg/dL (ref 1.7–2.4)

## 2018-08-21 MED ORDER — DEXTROSE-NACL 5-0.9 % IV SOLN
INTRAVENOUS | Status: DC
  Administered 2018-08-21 – 2018-08-23 (×3): via INTRAVENOUS

## 2018-08-21 NOTE — Progress Notes (Signed)
This RN spoke with the NP on call X. Blount. Patient's G tube has not been dislodged and flushes with no resistance. Patient feeling full with mild abdominal pain. Abdomen taught and distended. No bowel sounds heard. Tube feeding stopped. Patient made aware. Abdominal Xray ordered for the morning. Will continue to monitor closely.

## 2018-08-21 NOTE — Progress Notes (Signed)
Pt. Night time medications administered PO, Pro stat administered per tube. G tube flushed with 100 mL of tap water post medication administration with no resistance. This RN attempted to re-attach tube feeding and met resistance. Paused tube feeding for 30 minutes. Attempted to reconnect tube feeding after 30 minutes and continued to meet resistance. Post residual total of 50 mL's discarded. No bowel sounds present at this time. Patient reporting a full sensation. On call Jeannette Corpus paged and made aware. Will carry out any new orders and will continue to monitor.

## 2018-08-21 NOTE — Plan of Care (Signed)
Pt tolerating FLD and tube feedings well.

## 2018-08-21 NOTE — Progress Notes (Signed)
Assessment & Plan: SBO,rectus sheath hematoma  - tolerating diet and tube feedings  - pain improved, intermittent, on left at site of rectus sheath hematoma  - having BM's, on bowel regimen  Will sign off.  Will see prn.  Call if surgical issues arise.        Armandina Gemma, MD       [facility]       Office: [phone]   Chief Complaint: SBO, rectus sheath hematoma  Subjective: Patient in bed, comfortable, no complaints this AM.  Tolerated breakfast this AM.  Objective: Vital signs in last 24 hours: Temp:  [98.3 F (36.8 C)-98.7 F (37.1 C)] 98.7 F (37.1 C) (08/01 0447) Pulse Rate:  [80-89] 80 (08/01 0447) Resp:  [12-18] 13 (08/01 0447) BP: (105-126)/(56-83) 126/82 (08/01 0447) SpO2:  [96 %-100 %] 96 % (08/01 0800) FiO2 (%):  [28 %] 28 % (08/01 0800) Weight:  [43.1 kg] 43.1 kg (08/01 0447) Last BM Date: 08/20/18  Intake/Output from previous day: 07/31 0701 - 08/01 0700 In: 521.4 [P.O.:360; I.V.:74.8; NG/GT:86.7] Out: 1000 [Urine:1000] Intake/Output this shift: No intake/output data recorded.  Physical Exam: HEENT - sclerae clear, mucous membranes moist Neck - soft, trach site clear Abdomen - minimal distension; tender LLQ at hematoma; right side soft, non-tender  Lab Results:  Recent Labs    08/19/18 0958  WBC 11.4*  HGB 9.6*  HCT 31.5*  PLT 329   BMET Recent Labs    08/19/18 0416 08/20/18 1110  NA 135 135  K 4.1 4.0  CL 102 102  CO2 25 26  GLUCOSE 108* 145*  BUN 17 15  CREATININE 0.33* 0.34*  CALCIUM 8.8* 8.9   PT/INR No results for input(s): LABPROT, INR in the last 72 hours. Comprehensive Metabolic Panel:    Component Value Date/Time   NA 135 08/20/2018 1110   NA 135 08/19/2018 0416   NA 136 (A) 09/23/2016   K 4.0 08/20/2018 1110   K 4.1 08/19/2018 0416   CL 102 08/20/2018 1110   CL 102 08/19/2018 0416   CL 85 09/23/2016   CO2 26 08/20/2018 1110   CO2 25 08/19/2018 0416   BUN 15 08/20/2018 1110   BUN  17 08/19/2018 0416   BUN 35 (A) 09/23/2016   CREATININE 0.34 (L) 08/20/2018 1110   CREATININE 0.33 (L) 08/19/2018 0416   GLUCOSE 145 (H) 08/20/2018 1110   GLUCOSE 108 (H) 08/19/2018 0416   CALCIUM 8.9 08/20/2018 1110   CALCIUM 8.8 (L) 08/19/2018 0416   CALCIUM 10.4 09/23/2016   AST 14 (L) 08/19/2018 0416   AST 10 (L) 08/16/2018 0424   ALT 12 08/19/2018 0416   ALT 10 08/16/2018 0424   ALKPHOS 83 08/19/2018 0416   ALKPHOS 79 08/16/2018 0424   ALKPHOS 195 09/23/2016   BILITOT 0.4 08/19/2018 0416   BILITOT 0.8 08/16/2018 0424   BILITOT 0.2 09/23/2016   PROT 7.1 08/19/2018 0416   PROT 6.8 08/16/2018 0424   PROT 8.1 09/23/2016   ALBUMIN 3.3 (L) 08/19/2018 0416   ALBUMIN 3.0 (L) 08/16/2018 0424   ALBUMIN 4.3 09/23/2016    Studies/Results: Dg Chest Port 1 View  Result Date: 08/19/2018 CLINICAL DATA:  Low-grade fever EXAM: PORTABLE CHEST 1 VIEW COMPARISON:  Nine days ago FINDINGS: Tracheostomy tube in place. Right PICC with tip at the distal SVC. Dual-chamber pacer leads from the left in stable position. Patient's right hand overlaps the right lung base. There is no edema, consolidation, effusion, or  pneumothorax. Normal heart size and mediastinal contours. IMPRESSION: No evidence of pneumonia or other acute process. Electronically Signed   By: Monte Fantasia M.D.   On: 08/19/2018 11:50      Armandina Gemma 08/21/2018  Patient ID: Angela Page, female   DOB: 02/11/1970, 48 y.o.   MRN: [id number]

## 2018-08-21 NOTE — Progress Notes (Signed)
PROGRESS NOTE    Angela Page  WCB:[id number] DOB: 1970/05/08 DOA: 08/10/2018 PCP: Default, Provider, MD    Brief Narrative: 47 y.o.femalewith medical history significant oftraumatic C spine fracture resulting in quadriplegia s/p trach, peg, suprapubic catheter as well as COPD, chronic hypotension, T2DM, chronic pain, SSS s/p pacemaker, and chronic decubitus ulcers presenting with abdominal pain, worsening  With no vomiting until in ED.    Imaging concerning for SBO and rectus sheath hematoma. Labs with anemia. Surgery c/s. Labs, imaging. Abx for possible UTI. Patient was admitted, seen by surgery, being managed conservatively.  Abdominal x-ray shows resolution of SBO  Assessment & Plan:   Principal Problem:   SBO (small bowel obstruction) (HCC) Active Problems:   Quadriplegia (HCC)   Symptomatic anemia   Rectus sheath hematoma, initial encounter  Bowel obstruction versus ileus from rectus sheath hematoma abd X ray shows resolution of the SBO. Pt able to tolerate full liquids, tube feeds are tolerated.  Appreciate surgery recommendations. Replete electrolytes as appropriate. TPN discontinued .     Rectus sheath hematoma CT of the abdomen shows hematoma 17.5 x 13.0 x 7.5 cm. Transfuse to keep hemoglobin greater than 7. Repeat CBC in am.  Patient received 2 units of PRBC so far.    Acute blood loss anemia probably secondary to rectal sheath hematoma Transfuse to keep hemoglobin greater than 7. Continue to monitor daily CBCs.   History of quadriplegia status post trach, PEG, suprapubic catheter secondary to traumatic C-spine fracture Continue with supportive care with trial of trach collar, supplemental oxygen as needed to keep sats greater than 90%, pulmonary toilet.   Metabolic acidosis Resolved.    Type 2 diabetes mellitus Resume SSI. No change in meds.  CBG (last 3)  Recent Labs    08/21/18 0740 08/21/18 1133 08/21/18 1638  GLUCAP 124* 129*  167*     Hypophosphatemia and hypokalemia Replaced as appropriate.   Abnormal UA Completed 5 days of antibiotics. Repeat cultures ordered and are negative.      Depression/insomnia Continue with Cymbalta and Wellbutrin.     Hypotension Resolved.    COPD Continue with home bronchodilators.     DVT prophylaxis: scd's Code Status:  Full code.  Family Communication: none at bedside.  Disposition Plan: Pending clinical improvement, once able to tolerate the tube feeds , pt should be able to be discharge to [facility] in the next 24 to 48 hours. Possibly tomorrow after supra pubic catheter is replaced.    Consultants:   General surgery   Procedures: None  Antimicrobials: Completed Rocephin  Subjective: Continues to have BM, no chest pain or sob.   Objective: Vitals:   08/21/18 0800 08/21/18 1141 08/21/18 1300 08/21/18 1510  BP:   136/82   Pulse:   79   Resp:   16   Temp:   98.6 F (37 C)   TempSrc:   Oral   SpO2: 96% 95% 96% 95%  Weight:      Height:        Intake/Output Summary (Last 24 hours) at 08/21/2018 1932 Last data filed at 08/21/2018 1332 Gross per 24 hour  Intake 541 ml  Output 1400 ml  Net -859 ml   Filed Weights   08/18/18 0416 08/19/18 0615 08/21/18 0447  Weight: 44.9 kg 45 kg 43.1 kg    Examination:  General exam: Appears calm and comfortable status post trach, PEG Respiratory system: Clear to auscultation. Respiratory effort normal. No wheezing or rhonchi.  Cardiovascular system: S1 &  S2 heard, RRR. Gastrointestinal system: Abdomen is firm, distended, suprapubic catheter present.  Central nervous system: Alert and oriented.  Quadriplegic Extremities: Quadriplegic Skin: Sacral decubitus ulcer Psychiatry: Mood & affect appropriate.     Data Reviewed: I have personally reviewed following labs and imaging studies  CBC: Recent Labs  Lab 08/15/18 0335 08/16/18 0424 08/17/18 0506 08/19/18 0958  WBC 8.7 7.5 8.5 11.4*   NEUTROABS  --  5.5  --   --   HGB 8.9* 9.2* 9.0* 9.6*  HCT 32.0* 30.9* 30.4* 31.5*  MCV 99.1 94.8 95.0 93.2  PLT 384 337 292 546   Basic Metabolic Panel: Recent Labs  Lab 08/16/18 0424 08/17/18 0506 08/18/18 0441 08/19/18 0416 08/20/18 1110 08/20/18 1539 08/21/18 0331 08/21/18 1700  NA 143 138 135 135 135  --   --   --   K 2.8* 3.2* 4.2 4.1 4.0  --   --   --   CL 108 109 103 102 102  --   --   --   CO2 23 23 23 25 26   --   --   --   GLUCOSE 102* 146* 135* 108* 145*  --   --   --   BUN 6 8 13 17 15   --   --   --   CREATININE 0.37* <0.30* 0.30* 0.33* 0.34*  --   --   --   CALCIUM 8.8* 8.4* 8.7* 8.8* 8.9  --   --   --   MG 1.7 1.7 2.0 2.0 1.8 1.8 1.9 1.8  PHOS 2.6 1.8* 3.0 3.6 3.7 3.9 3.8 4.3   GFR: Estimated Creatinine Clearance: 59.2 mL/min (A) (by C-G formula based on SCr of 0.34 mg/dL (L)). Liver Function Tests: Recent Labs  Lab 08/16/18 0424 08/19/18 0416  AST 10* 14*  ALT 10 12  ALKPHOS 79 83  BILITOT 0.8 0.4  PROT 6.8 7.1  ALBUMIN 3.0* 3.3*   No results for input(s): LIPASE, AMYLASE in the last 168 hours. No results for input(s): AMMONIA in the last 168 hours. Coagulation Profile: No results for input(s): INR, PROTIME in the last 168 hours. Cardiac Enzymes: No results for input(s): CKTOTAL, CKMB, CKMBINDEX, TROPONINI in the last 168 hours. BNP (last 3 results) No results for input(s): PROBNP in the last 8760 hours. HbA1C: No results for input(s): HGBA1C in the last 72 hours. CBG: Recent Labs  Lab 08/21/18 0003 08/21/18 0445 08/21/18 0740 08/21/18 1133 08/21/18 1638  GLUCAP 134* 126* 124* 129* 167*   Lipid Profile: No results for input(s): CHOL, HDL, LDLCALC, TRIG, CHOLHDL, LDLDIRECT in the last 72 hours. Thyroid Function Tests: No results for input(s): TSH, T4TOTAL, FREET4, T3FREE, THYROIDAB in the last 72 hours. Anemia Panel: No results for input(s): VITAMINB12, FOLATE, FERRITIN, TIBC, IRON, RETICCTPCT in the last 72 hours. Sepsis Labs: No  results for input(s): PROCALCITON, LATICACIDVEN in the last 168 hours.  Recent Results (from the past 240 hour(s))  Culture, Urine     Status: None   Collection Time: 08/19/18 12:12 PM   Specimen: Urine, Catheterized  Result Value Ref Range Status   Specimen Description   Final    URINE, CATHETERIZED Performed at [facility] [address], [place], [place] [address]    Special Requests   Final    NONE Performed at [facility], [place] [address], [place], [place] [address]    Culture   Final    NO GROWTH Performed at [facility], [address] [place], [place] [address]    Report Status 08/20/2018 FINAL  Final         Radiology Studies: No results found.      Scheduled Meds: . sodium chloride   Intravenous Once  . baclofen  5 mg Oral TID  . buPROPion  150 mg Oral Daily  . docusate  100 mg Per Tube Daily  . DULoxetine  30 mg Oral BID  . feeding supplement (GLUCERNA 1.2 CAL)  400 mL Per Tube Q24H  . feeding supplement (PRO-STAT SUGAR FREE 64)  30 mL Per Tube TID  . gabapentin  300 mg Oral TID  . insulin aspart  0-9 Units Subcutaneous Q4H  . LORazepam  0.5 mg Intravenous Once  . polyethylene glycol  17 g Oral BID  . sodium chloride flush  10-40 mL Intracatheter Q12H  . umeclidinium bromide  1 puff Inhalation Daily  . zolpidem  2.5 mg Oral QHS   Continuous Infusions:    LOS: 11 days    Time spent: 30 minutes.     Hosie Poisson, MD [facility] Pager [phone]   If 7PM-7AM, please contact night-coverage [email or web address] Password [facility] 08/21/2018, 7:32 PM

## 2018-08-22 ENCOUNTER — Inpatient Hospital Stay (HOSPITAL_COMMUNITY): Payer: Medicare Other

## 2018-08-22 LAB — GLUCOSE, CAPILLARY
Glucose-Capillary: 133 mg/dL — ABNORMAL HIGH (ref 70–99)
Glucose-Capillary: 113 mg/dL — ABNORMAL HIGH (ref 70–99)
Glucose-Capillary: 133 mg/dL — ABNORMAL HIGH (ref 70–99)
Glucose-Capillary: 153 mg/dL — ABNORMAL HIGH (ref 70–99)
Glucose-Capillary: 84 mg/dL (ref 70–99)

## 2018-08-22 LAB — BASIC METABOLIC PANEL
Anion gap: 9 (ref 5–15)
BUN: 17 mg/dL (ref 6–20)
CO2: 27 mmol/L (ref 22–32)
Calcium: 8.8 mg/dL — ABNORMAL LOW (ref 8.9–10.3)
Chloride: 99 mmol/L (ref 98–111)
Creatinine, Ser: 0.42 mg/dL — ABNORMAL LOW (ref 0.44–1.00)
GFR calc Af Amer: >60 mL/min (ref >60)
GFR calc non Af Amer: >60 mL/min (ref >60)
Glucose, Bld: 131 mg/dL — ABNORMAL HIGH (ref 70–99)
Potassium: 3.6 mmol/L (ref 3.5–5.1)
Sodium: 135 mmol/L (ref 135–145)

## 2018-08-22 LAB — CBC
HCT: 29.2 % — ABNORMAL LOW (ref 36.0–46.0)
Hemoglobin: 8.6 g/dL — ABNORMAL LOW (ref 12.0–15.0)
MCH: 27.7 pg (ref 26.0–34.0)
MCHC: 29.5 g/dL — ABNORMAL LOW (ref 30.0–36.0)
MCV: 93.9 fL (ref 80.0–100.0)
Platelets: 310 10*3/uL (ref 150–400)
RBC: 3.11 MIL/uL — ABNORMAL LOW (ref 3.87–5.11)
RDW: 16.9 % — ABNORMAL HIGH (ref 11.5–15.5)
WBC: 8.5 10*3/uL (ref 4.0–10.5)
nRBC: 0 % (ref 0.0–0.2)

## 2018-08-22 MED ORDER — GLUCERNA 1.2 CAL PO LIQD
400.0000 mL | ORAL | Status: DC
  Administered 2018-08-23 (×2): 400 mL
  Filled 2018-08-22 (×3): qty 474

## 2018-08-22 MED ORDER — POTASSIUM CHLORIDE 10 MEQ/100ML IV SOLN
10.0000 meq | INTRAVENOUS | Status: AC
  Administered 2018-08-22 (×2): 10 meq via INTRAVENOUS
  Filled 2018-08-22 (×2): qty 100

## 2018-08-22 NOTE — Progress Notes (Signed)
PROGRESS NOTE    Teigen Parslow  ZOX:[id number] DOB: [birth year] DOA: 08/10/2018 PCP: Default, Provider, MD    Brief Narrative: 48 y.o.femalewith medical history significant oftraumatic C spine fracture resulting in quadriplegia s/p trach, peg, suprapubic catheter as well as COPD, chronic hypotension, T2DM, chronic pain, SSS s/p pacemaker, and chronic decubitus ulcers presenting with abdominal pain, worsening  With no vomiting until in ED.    Imaging concerning for SBO and rectus sheath hematoma. Labs with anemia. Surgery c/s. Labs, imaging. Abx for possible UTI. Patient was admitted, seen by surgery, being managed conservatively.  Abdominal x-ray shows resolution of SBO  Assessment & Plan:   Principal Problem:   SBO (small bowel obstruction) (HCC) Active Problems:   Quadriplegia (HCC)   Symptomatic anemia   Rectus sheath hematoma, initial encounter  Bowel obstruction versus ileus from rectus sheath hematoma abd X ray shows resolution of the SBO. Pt had bowel movement yesterday. But overnight pt had abd pain and had resistance to tube feeds, TF were held. An abd x ray this am ruled out SBO or ileus, and tube feeds were restarted.   Appreciate surgery recommendations. Replete electrolytes as appropriate. TPN discontinued .     Rectus sheath hematoma CT of the abdomen shows hematoma 17.5 x 13.0 x 7.5 cm. Transfuse to keep hemoglobin greater than 7. Repeat CBC in am.  Patient received 2 units of PRBC so far.    Acute blood loss anemia probably secondary to rectal sheath hematoma Transfuse to keep hemoglobin greater than 7. Continue to monitor daily CBCs. Hemoglobin around 8.6    History of quadriplegia status post trach, PEG, suprapubic catheter secondary to traumatic C-spine fracture Continue with supportive care with trial of trach collar, supplemental oxygen as needed to keep sats greater than 90%, pulmonary toilet.   Metabolic acidosis Resolved.     Type 2 diabetes mellitus Resume SSI. No change in meds.  CBG (last 3)  Recent Labs    08/21/18 2351 08/22/18 0352 08/22/18 0758  GLUCAP 118* 133* 113*     Hypophosphatemia and hypokalemia Replaced as appropriate.   Abnormal UA Completed 5 days of antibiotics. Repeat cultures ordered and are negative.  Exchange supra pubic catheter today.      Depression/insomnia Continue with Cymbalta and Wellbutrin.     Hypotension Resolved.    COPD Continue with home bronchodilators.     DVT prophylaxis: scd's Code Status:  Full code.  Family Communication: none at bedside.  Disposition Plan: Pending clinical improvement, once able to tolerate the tube feeds , pt should be able to be discharge to [facility] in the next 24 to 48 hours. Possibly tomorrow after supra pubic catheter is replaced.    Consultants:   General surgery   Procedures: None  Antimicrobials: Completed Rocephin  Subjective: No BM today. abd pain improved.  Restart tube feeds as AXR does not show any ileues or SBO.   Objective: Vitals:   08/22/18 0348 08/22/18 0350 08/22/18 0840 08/22/18 0843  BP:  (!) 153/86    Pulse:  88    Resp:  20    Temp:  99.8 F (37.7 C)    TempSrc:  Oral    SpO2:  96% 95% 95%  Weight: 44 kg     Height:        Intake/Output Summary (Last 24 hours) at 08/22/2018 1121 Last data filed at 08/22/2018 0931 Gross per 24 hour  Intake 1538.24 ml  Output 1775 ml  Net -236.76 ml  Filed Weights   08/19/18 0615 08/21/18 0447 08/22/18 0348  Weight: 45 kg 43.1 kg 44 kg    Examination:  General exam: Appears calm and comfortable status post trach, PEG Respiratory system: Clear to auscultation. Respiratory effort normal. No wheezing or rhonchi.  Cardiovascular system: S1 & S2 heard, RRR. Gastrointestinal system: Abdomen is firm, distended, suprapubic catheter present.  Central nervous system: Alert and oriented.  Quadriplegic Extremities: Quadriplegic Skin:  Sacral decubitus ulcer Psychiatry: Mood & affect appropriate.     Data Reviewed: I have personally reviewed following labs and imaging studies  CBC: Recent Labs  Lab 08/16/18 0424 08/17/18 0506 08/19/18 0958 08/22/18 0429  WBC 7.5 8.5 11.4* 8.5  NEUTROABS 5.5  --   --   --   HGB 9.2* 9.0* 9.6* 8.6*  HCT 30.9* 30.4* 31.5* 29.2*  MCV 94.8 95.0 93.2 93.9  PLT 337 292 329 631   Basic Metabolic Panel: Recent Labs  Lab 08/17/18 0506 08/18/18 0441 08/19/18 0416 08/20/18 1110 08/20/18 1539 08/21/18 0331 08/21/18 1700 08/22/18 0429  NA 138 135 135 135  --   --   --  135  K 3.2* 4.2 4.1 4.0  --   --   --  3.6  CL 109 103 102 102  --   --   --  99  CO2 23 23 25 26   --   --   --  27  GLUCOSE 146* 135* 108* 145*  --   --   --  131*  BUN 8 13 17 15   --   --   --  17  CREATININE <0.30* 0.30* 0.33* 0.34*  --   --   --  0.42*  CALCIUM 8.4* 8.7* 8.8* 8.9  --   --   --  8.8*  MG 1.7 2.0 2.0 1.8 1.8 1.9 1.8  --   PHOS 1.8* 3.0 3.6 3.7 3.9 3.8 4.3  --    GFR: Estimated Creatinine Clearance: 60.4 mL/min (A) (by C-G formula based on SCr of 0.42 mg/dL (L)). Liver Function Tests: Recent Labs  Lab 08/16/18 0424 08/19/18 0416  AST 10* 14*  ALT 10 12  ALKPHOS 79 83  BILITOT 0.8 0.4  PROT 6.8 7.1  ALBUMIN 3.0* 3.3*   No results for input(s): LIPASE, AMYLASE in the last 168 hours. No results for input(s): AMMONIA in the last 168 hours. Coagulation Profile: No results for input(s): INR, PROTIME in the last 168 hours. Cardiac Enzymes: No results for input(s): CKTOTAL, CKMB, CKMBINDEX, TROPONINI in the last 168 hours. BNP (last 3 results) No results for input(s): PROBNP in the last 8760 hours. HbA1C: No results for input(s): HGBA1C in the last 72 hours. CBG: Recent Labs  Lab 08/21/18 1638 08/21/18 2026 08/21/18 2351 08/22/18 0352 08/22/18 0758  GLUCAP 167* 115* 118* 133* 113*   Lipid Profile: No results for input(s): CHOL, HDL, LDLCALC, TRIG, CHOLHDL, LDLDIRECT in the  last 72 hours. Thyroid Function Tests: No results for input(s): TSH, T4TOTAL, FREET4, T3FREE, THYROIDAB in the last 72 hours. Anemia Panel: No results for input(s): VITAMINB12, FOLATE, FERRITIN, TIBC, IRON, RETICCTPCT in the last 72 hours. Sepsis Labs: No results for input(s): PROCALCITON, LATICACIDVEN in the last 168 hours.  Recent Results (from the past 240 hour(s))  Culture, Urine     Status: None   Collection Time: 08/19/18 12:12 PM   Specimen: Urine, Catheterized  Result Value Ref Range Status   Specimen Description   Final    URINE, CATHETERIZED Performed at [facility]  [place] [address] [place], [place] [address]    Special Requests   Final    NONE Performed at [facility], [place] [address] [place], [place] [address]    Culture   Final    NO GROWTH Performed at [facility], [place] [address], [place], [place] [address]    Report Status 08/20/2018 FINAL  Final         Radiology Studies: No results found.      Scheduled Meds:  sodium chloride   Intravenous Once   baclofen  5 mg Oral TID   buPROPion  150 mg Oral Daily   docusate  100 mg Per Tube Daily   DULoxetine  30 mg Oral BID   feeding supplement (GLUCERNA 1.2 CAL)  400 mL Per Tube Q24H   feeding supplement (PRO-STAT SUGAR FREE 64)  30 mL Per Tube TID   gabapentin  300 mg Oral TID   insulin aspart  0-9 Units Subcutaneous Q4H   LORazepam  0.5 mg Intravenous Once   polyethylene glycol  17 g Oral BID   sodium chloride flush  10-40 mL Intracatheter Q12H   umeclidinium bromide  1 puff Inhalation Daily   zolpidem  2.5 mg Oral QHS   Continuous Infusions:  dextrose 5 % and 0.9% NaCl 50 mL/hr at 08/21/18 2349     LOS: 12 days    Time spent: 30 minutes.     Hosie Poisson, MD [facility] Pager [phone]   If 7PM-7AM, please contact night-coverage [email or web address] Password [email or web address] 08/22/2018, 11:21 AM

## 2018-08-22 NOTE — Progress Notes (Signed)
Patient has refused to be repositioned all day.

## 2018-08-23 LAB — BASIC METABOLIC PANEL
Anion gap: 9 (ref 5–15)
BUN: 13 mg/dL (ref 6–20)
CO2: 25 mmol/L (ref 22–32)
Calcium: 8.9 mg/dL (ref 8.9–10.3)
Chloride: 103 mmol/L (ref 98–111)
Creatinine, Ser: 0.4 mg/dL — ABNORMAL LOW (ref 0.44–1.00)
GFR calc Af Amer: >60 mL/min (ref >60)
GFR calc non Af Amer: >60 mL/min (ref >60)
Glucose, Bld: 148 mg/dL — ABNORMAL HIGH (ref 70–99)
Potassium: 3.9 mmol/L (ref 3.5–5.1)
Sodium: 137 mmol/L (ref 135–145)

## 2018-08-23 LAB — GLUCOSE, CAPILLARY
Glucose-Capillary: 107 mg/dL — ABNORMAL HIGH (ref 70–99)
Glucose-Capillary: 114 mg/dL — ABNORMAL HIGH (ref 70–99)
Glucose-Capillary: 124 mg/dL — ABNORMAL HIGH (ref 70–99)
Glucose-Capillary: 140 mg/dL — ABNORMAL HIGH (ref 70–99)
Glucose-Capillary: 159 mg/dL — ABNORMAL HIGH (ref 70–99)
Glucose-Capillary: 159 mg/dL — ABNORMAL HIGH (ref 70–99)

## 2018-08-23 MED ORDER — GLUCERNA 1.2 CAL PO LIQD: 400.0000 mL | ORAL | Status: DC

## 2018-08-23 MED ORDER — BISACODYL 10 MG RE SUPP: 10.0000 mg | Freq: Every day | RECTAL | 0 refills | Status: DC | PRN

## 2018-08-23 MED ORDER — PRO-STAT SUGAR FREE PO LIQD: 30.0000 mL | Freq: Three times a day (TID) | ORAL | 0 refills | Status: AC

## 2018-08-23 MED ORDER — DOCUSATE SODIUM 50 MG/5ML PO LIQD: 100.0000 mg | Freq: Every day | ORAL | 0 refills | Status: DC

## 2018-08-23 NOTE — Discharge Summary (Signed)
Physician Discharge Summary  Angela Page ZOX:[id number] DOB: 1970/01/23 DOA: 08/10/2018  PCP: Default, Provider, MD  Admit date: 08/10/2018 Discharge date: 08/24/2018  Admitted From: snf Disposition:  SNF   Recommendations for Outpatient Follow-up:  1. Follow up with PCP in 1-2 weeks 2. Please obtain BMP/CBC in one week 3. Please follow up with surgery as needed     Discharge Condition:GUARDED.  CODE STATUS FULL CODE.  Diet recommendation: Tube feeds.   Brief/Interim Summary: 48 y.o.femalewith medical history significant oftraumatic C spine fracture resulting in quadriplegia s/p trach, peg, suprapubic catheter as well as COPD, chronic hypotension, T2DM, chronic pain, SSS s/p pacemaker, and chronic decubitus ulcers presenting with abdominal pain, worsening With no vomiting until in ED.    Imaging concerning for SBO and rectus sheath hematoma. Labs with anemia. Surgery c/s. Labs, imaging. Abx for possible UTI. Patient was admitted, seen by surgery, being managed conservatively.  Abdominal x-ray shows resolution of SBO  Discharge Diagnoses:  Principal Problem:   SBO (small bowel obstruction) (HCC) Active Problems:   Quadriplegia (HCC)   Symptomatic anemia   Rectus sheath hematoma, initial encounter  Bowel obstruction versus ileus from rectus sheath hematoma abd X ray shows resolution of the SBO. Pt had bowel movement yesterday. But overnight pt had abd pain and had resistance to tube feeds, TF were held. An abd x ray this am ruled out SBO or ileus, and tube feeds were restarted.   Appreciate surgery recommendations. Replete electrolytes as appropriate. TPN discontinued .     Rectus sheath hematoma CT of the abdomen shows hematoma 17.5 x 13.0 x 7.5 cm. Transfuse to keep hemoglobin greater than 7. Repeat CBC in am.  Patient received 2 units of PRBC so far.    Acute blood loss anemia probably secondary to rectal sheath hematoma Transfuse to keep  hemoglobin greater than 7. Continue to monitor daily CBCs. Hemoglobin around 8.6    History of quadriplegia status post trach, PEG, suprapubic catheter secondary to traumatic C-spine fracture Continue with supportive care with trial of trach collar, supplemental oxygen as needed to keep sats greater than 90%, pulmonary toilet.   Metabolic acidosis Resolved.    Type 2 diabetes mellitus Resume SSI. No change in meds.     Hypophosphatemia and hypokalemia Replaced as appropriate.   Abnormal UA Completed 5 days of antibiotics. Repeat cultures ordered and are negative.  Exchange supra pubic catheter on 08/22/2018      Depression/insomnia Continue with Cymbalta and Wellbutrin.     Hypotension Resolved.    COPD Continue with home bronchodilators.     Discharge Instructions   Allergies as of 08/23/2018      Reactions   Penicillins Hives   Has patient had a PCN reaction causing immediate rash, facial/tongue/throat swelling, SOB or lightheadedness with hypotension: Yes Has patient had a PCN reaction causing severe rash involving mucus membranes or skin necrosis: Yes Has patient had a PCN reaction that required hospitalization No Has patient had a PCN reaction occurring within the last 10 years: No-childhood allergy If all of the above answers are "NO", then may proceed with Cephalosporin  Tolerated cephalosporins in past   Erythromycin Nausea And Vomiting   Nitrofurantoin Monohyd Macro Nausea And Vomiting   Oxybutynin Other (See Comments)   Dries mouth out       Medication List    STOP taking these medications   enoxaparin 40 MG/0.4ML injection Commonly known as: LOVENOX   famotidine 20 MG tablet Commonly known as: PEPCID  levofloxacin 750 MG tablet Commonly known as: LEVAQUIN     TAKE these medications   acetaminophen 500 MG tablet Commonly known as: TYLENOL Take 500 mg by mouth every 8 (eight) hours as needed for mild pain.    baclofen 5 mg Tabs tablet Commonly known as: LIORESAL Take 5 mg by mouth 3 (three) times daily.   Basaglar KwikPen 100 UNIT/ML Sopn Inject 0.05 mLs (5 Units total) into the skin at bedtime.   bisacodyl 10 MG suppository Commonly known as: DULCOLAX Place 1 suppository (10 mg total) rectally daily as needed for mild constipation.   buPROPion 150 MG 12 hr tablet Commonly known as: WELLBUTRIN SR Take 150 mg by mouth daily.   docusate 50 MG/5ML liquid Commonly known as: COLACE Place 10 mLs (100 mg total) into feeding tube daily. Start taking on: August 24, 2018   DULoxetine 30 MG capsule Commonly known as: CYMBALTA Take 1 capsule (30 mg total) by mouth 2 (two) times daily.   ProMod Liqd Take 30 mLs by mouth 3 (three) times daily. What changed:   Another medication with the same name was added. Make sure you understand how and when to take each.  Another medication with the same name was changed. Make sure you understand how and when to take each.   feeding supplement (GLUCERNA 1.2 CAL) Liqd Place 1,000 mLs into feeding tube daily. What changed:   when to take this  additional instructions   feeding supplement (GLUCERNA 1.2 CAL) Liqd Place 400 mLs into feeding tube daily. Start taking on: August 24, 2018 What changed: You were already taking a medication with the same name, and this prescription was added. Make sure you understand how and when to take each.   feeding supplement (PRO-STAT SUGAR FREE 64) Liqd Place 30 mLs into feeding tube 3 (three) times daily.   ferrous sulfate 300 (60 Fe) MG/5ML syrup Place 3.7 mLs (220 mg total) into feeding tube daily with breakfast. What changed: Another medication with the same name was removed. Continue taking this medication, and follow the directions you see here.   gabapentin 300 MG capsule Commonly known as: NEURONTIN Take 300 mg by mouth 3 (three) times daily.   insulin aspart 100 UNIT/ML injection Commonly known as:  novoLOG Inject 10 Units into the skin every 4 (four) hours. For glucose 150 to 200 use 1 units, for glucose 201-250 use 2 units, for 251 to 300 use 4 units, for 301 to 350 use 6 units, for 351 to 400 use 8 units, for 401 or greater use 10 units. What changed:   how much to take  when to take this  additional instructions   lip balm ointment Apply topically as needed for lip care.   loratadine 10 MG tablet Commonly known as: CLARITIN Take 10 mg by mouth daily.   metoCLOPramide 5 MG tablet Commonly known as: REGLAN Place 5 mg into feeding tube 4 (four) times daily.   midodrine 10 MG tablet Commonly known as: PROAMATINE Take 10 mg by mouth 3 (three) times daily as needed (hypotension).   oxycodone 5 MG capsule Commonly known as: OXY-IR Take 5 mg by mouth every 8 (eight) hours as needed for pain.   OXYGEN Inhale 3 L into the lungs continuous.   pantoprazole 40 MG tablet Commonly known as: PROTONIX Take 40 mg by mouth 2 (two) times daily.   polyethylene glycol 17 g packet Commonly known as: MIRALAX / GLYCOLAX Take 17 g by mouth daily. Hold if diarrhea.  tiotropium 18 MCG inhalation capsule Commonly known as: SPIRIVA Place 18 mcg into inhaler and inhale daily.   Trulicity 1.5 FM/3.8GY Sopn Generic drug: Dulaglutide Inject 1.5 mg into the skin every Friday.   zolpidem 5 MG tablet Commonly known as: AMBIEN Take 2.5 mg by mouth at bedtime.      Contact information for after-discharge care    Destination    [facility] .   Service: Skilled Nursing Contact information: [address] [address] [phone]             Allergies  Allergen Reactions  . Penicillins Hives    Has patient had a PCN reaction causing immediate rash, facial/tongue/throat swelling, SOB or lightheadedness with hypotension: Yes Has patient had a PCN reaction causing severe rash involving mucus membranes or skin necrosis: Yes Has  patient had a PCN reaction that required hospitalization No Has patient had a PCN reaction occurring within the last 10 years: No-childhood allergy If all of the above answers are "NO", then may proceed with Cephalosporin  Tolerated cephalosporins in past  . Erythromycin Nausea And Vomiting  . Nitrofurantoin Monohyd Macro Nausea And Vomiting  . Oxybutynin Other (See Comments)    Dries mouth out     Consultations:  Surgery.    Procedures/Studies: Ct Abdomen Pelvis W Contrast  Result Date: 08/10/2018 CLINICAL DATA:  Abdominal pain since this morning. EXAM: CT ABDOMEN AND PELVIS WITH CONTRAST TECHNIQUE: Multidetector CT imaging of the abdomen and pelvis was performed using the standard protocol following bolus administration of intravenous contrast. CONTRAST:  20m OMNIPAQUE IOHEXOL 300 MG/ML  SOLN COMPARISON:  12/16/2017 FINDINGS: Lower chest: Bibasilar atelectasis and scarring changes. No definite infiltrates or effusions. The heart is normal in size. No pericardial effusion. Pacer wires are noted. Hepatobiliary: No focal hepatic lesions or intrahepatic biliary dilatation. The gallbladder demonstrates small gallstones but no findings for acute cholecystitis. No common bile duct dilatation. Pancreas: No mass, inflammation or ductal dilatation. Spleen: Normal size.  No focal lesions. Adrenals/Urinary Tract: The adrenal glands and kidneys are unremarkable. There is a lower pole left renal calculus noted but no obstructing ureteral calculi or bladder calculi. No obvious bladder lesions. Patient has a suprapubic urinary catheter in place. Stomach/Bowel: The stomach contains a feeding gastrostomy tube. No complicating features are identified. There is mild distention of the stomach with fluid. The duodenum is unremarkable. The proximal and mid small bowel loops are dilated and demonstrate air-fluid levels consistent with obstruction. There appears to be a transition to nondilated/decompressed loops of  small bowel in the upper pelvis in the midline (series 2, image 53 and series 5 image 49, 50 and 51). This is likely due to adhesions from prior surgery. Moderate stool in the colon and down into the rectum. No colonic mass or obstruction. The terminal ileum appears normal. Vascular/Lymphatic: Advanced atherosclerotic calcifications involving the aorta iliac arteries but no aneurysm. The branch vessels are patent. The major venous structures are patent. Small scattered mesenteric and retroperitoneal lymph nodes but no mass or overt adenopathy. Reproductive: The uterus and ovaries are unremarkable. Other: There is a large left rectus sheath hematoma measuring approximately 17.5 x 13.0 x 7.5 cm. Musculoskeletal: No significant bony findings. Advanced osteoporosis for age. A sacral decubitus ulcer is noted and extends down very close to the bone. I do not see any obvious destructive bony changes or ever it appears that the lower sacrum and coccyx have been previously resected. IMPRESSION: 1. CT findings consistent  with a fairly high-grade small-bowel obstruction in the mid small bowel region in the central abdomen at the level of the iliac crest. This is most likely due to adhesions. 2. Very large left rectus sheath hematoma. 3. Feeding gastrostomy tube and suprapubic catheter is in good position without complicating features. 4. Sacral decubitus ulcer. 5. Marked age advanced vascular calcifications for age. 6. Lower pole left renal calculus but no obstructing ureteral calculi. Electronically Signed   By: Marijo Sanes M.D.   On: 08/10/2018 16:28   Dg Chest Port 1 View  Result Date: 08/19/2018 CLINICAL DATA:  Low-grade fever EXAM: PORTABLE CHEST 1 VIEW COMPARISON:  Nine days ago FINDINGS: Tracheostomy tube in place. Right PICC with tip at the distal SVC. Dual-chamber pacer leads from the left in stable position. Patient's right hand overlaps the right lung base. There is no edema, consolidation, effusion, or  pneumothorax. Normal heart size and mediastinal contours. IMPRESSION: No evidence of pneumonia or other acute process. Electronically Signed   By: Monte Fantasia M.D.   On: 08/19/2018 11:50   Dg Chest Portable 1 View  Result Date: 08/10/2018 CLINICAL DATA:  Abdominal pain since 0300 hours this morning, nausea, single episode of vomiting, abdominal distension today, quadriplegia EXAM: PORTABLE CHEST 1 VIEW COMPARISON:  Portable exam 1403 hours compared to 07/20/2018 FINDINGS: LEFT subclavian pacemaker with leads projecting over RIGHT atrium and RIGHT ventricle. Tracheostomy tube projects over tracheal air column. Normal heart size, mediastinal contours, and pulmonary vascularity. RIGHT basilar scarring. Chronic accentuation of retrocardiac LEFT lower lobe markings unchanged since 06/22/2018 Remaining lungs clear. No pleural effusion or pneumothorax. No acute osseous findings. IMPRESSION: Chronic bibasilar changes without definite acute infiltrate. Electronically Signed   By: Lavonia Dana M.D.   On: 08/10/2018 14:52   Dg Abd Portable 1v  Result Date: 08/22/2018 CLINICAL DATA:  Pt is c/o pain to site of gastric tube as well as generalized pain to left lower abdomen. H/o quadriplegia, best obtainable images. EXAM: PORTABLE ABDOMEN - 1 VIEW COMPARISON:  08/18/2018 FINDINGS: Residual contrast noted in the colon. No evidence of bowel obstruction. Gastrostomy tube seen projecting along the left mid abdomen. Given the patient's positioning, this may reside in the stomach, but this is not conclusive. IMPRESSION: 1. No evidence of bowel obstruction. 2. Gastrostomy tube position cannot be confirmed within the stomach based on the provided images. Electronically Signed   By: Lajean Manes M.D.   On: 08/22/2018 14:57   Dg Abd Portable 1v  Result Date: 08/18/2018 CLINICAL DATA:  Abdominal distension. History of small-bowel obstruction. EXAM: PORTABLE ABDOMEN - 1 VIEW COMPARISON:  Multiple previous abdominal films. Most  recent is yesterday. FINDINGS: Most of the contrast has now passed into the colon. There are some persistent air-filled loops of small bowel but no significant distension. No free air. Stable position of the feeding gastrostomy tube. IMPRESSION: Interval resolution of small-bowel obstruction with passage of contrast into the colon. Electronically Signed   By: Marijo Sanes M.D.   On: 08/18/2018 08:16   Dg Abd Portable 1v  Result Date: 08/17/2018 CLINICAL DATA:  Small bowel obstruction EXAM: PORTABLE ABDOMEN - 1 VIEW COMPARISON:  08/15/2018 FINDINGS: A small amount of contrast is identified within small bowel loops in the LEFT mid abdomen. Persistent dilatation of small bowel loops is noted. Large bowel loops are not dilated. Patient is gastrostomy tube in the LEFT UPPER QUADRANT. Chronic deformities of the hips. IMPRESSION: Persistent small bowel dilatation. Small amount of contrast within small bowel loops. Electronically  Signed   By: Nolon Nations M.D.   On: 08/17/2018 08:06   Dg Abd Portable 1v  Result Date: 08/15/2018 CLINICAL DATA:  48 year old female with history of small-bowel obstruction. EXAM: PORTABLE ABDOMEN - 1 VIEW COMPARISON:  Abdominal radiograph 08/13/2018. FINDINGS: Percutaneous gastrostomy tube with retention balloon projecting over the epigastric region. Contrast in the stomach. Severe dilatation of small bowel loops which are gas-filled projecting over the mid abdomen measuring up to 4.6 cm in diameter. Paucity of colonic and rectal gas. No gross evidence of pneumoperitoneum. Pacemaker leads projecting over the lower thorax. Surgical clips projecting over the right lower abdomen. IMPRESSION: 1. The appearance of the abdomen remains compatible with small bowel obstruction. 2. Postoperative changes and support apparatus, as above. Electronically Signed   By: Vinnie Langton M.D.   On: 08/15/2018 06:24   Dg Abd Portable 1v  Result Date: 08/13/2018 CLINICAL DATA:  Small-bowel  obstruction. EXAM: PORTABLE ABDOMEN - 1 VIEW COMPARISON:  Radiographs of August 12, 2018. FINDINGS: Grossly stable small bowel dilatation is noted centrally. Residual contrast is noted within the gastric fundus. Gastrostomy tube is unchanged in position. IMPRESSION: Grossly stable small bowel dilatation is noted concerning for distal small bowel obstruction. Electronically Signed   By: Marijo Conception M.D.   On: 08/13/2018 08:08   Dg Abd Portable 1v  Result Date: 08/12/2018 CLINICAL DATA:  Abdominal pain, abdominal distention. EXAM: PORTABLE ABDOMEN - 1 VIEW COMPARISON:  Radiograph of August 11, 2018. FINDINGS: Stable small bowel dilatation is noted concerning for distal small bowel obstruction. No colonic dilatation is noted. Residual contrast remains within the gastric fundus. Gastrostomy tube is again noted and unchanged. IMPRESSION: Stable small bowel dilatation is noted concerning for distal small bowel obstruction. Electronically Signed   By: Marijo Conception M.D.   On: 08/12/2018 12:01   Dg Abd Portable 1v-small Bowel Obstruction Protocol-initial, 8 Hr Delay  Result Date: 08/11/2018 CLINICAL DATA:  Small bowel obstruction. EXAM: PORTABLE ABDOMEN - 1 VIEW COMPARISON:  CT scan dated 08/10/2018 FINDINGS: There are multiple persistent dilated loops of small bowel. Contrast is also seen in the fundus of the stomach. Gastrostomy tube in place. Colon does not appear distended. IMPRESSION: Persistent small bowel obstruction, slightly progressed. Electronically Signed   By: Lorriane Shire M.D.   On: 08/11/2018 18:11   Ir Picc Placement Right >5 Yrs Inc Img Guide  Result Date: 08/16/2018 INDICATION: Poor venous access. In need of durable intravenous access for the initiation of TPN. EXAM: ULTRASOUND AND FLUOROSCOPIC GUIDED PICC LINE INSERTION MEDICATIONS: None. CONTRAST:  None FLUOROSCOPY TIME:  24 seconds (1 mGy) COMPLICATIONS: None immediate. TECHNIQUE: The procedure, risks, benefits, and alternatives were  explained to the patient and informed written consent was obtained. A timeout was performed prior to the initiation of the procedure. 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 As such, the right upper extremity was prepped with chlorhexidine in a sterile fashion, and a sterile drape was applied covering the operative field. Maximum barrier sterile technique with sterile gowns and gloves were used for the procedure. A timeout was performed prior to the initiation of the procedure. Local anesthesia was provided with 1% lidocaine. Under direct ultrasound guidance, the brachial vein was accessed with a micropuncture kit after the overlying soft tissues were anesthetized with 1% lidocaine. After the overlying soft tissues were anesthetized, a small venotomy incision was created and a micropuncture kit was utilized  to access the right brachial vein. Real-time ultrasound guidance was utilized for vascular access including the acquisition of a permanent ultrasound image documenting patency of the accessed vessel. A guidewire was advanced to the level of the superior caval-atrial junction for measurement purposes and the PICC line was cut to length. A peel-away sheath was placed and a 37 cm, 5 [place], dual lumen was inserted to level of the superior caval-atrial junction. A post procedure spot fluoroscopic was obtained. The catheter easily aspirated and flushed and was sutured in place. A dressing was placed. The patient tolerated the procedure well without immediate post procedural complication. FINDINGS: After catheter placement, the tip lies within the superior cavoatrial junction. The catheter aspirates and flushes normally and is ready for immediate use. IMPRESSION: Successful ultrasound and fluoroscopic guided placement of a right brachial vein approach, 37 cm, 5 French, dual lumen PICC with tip at the superior caval-atrial  junction. The PICC line is ready for immediate use. Electronically Signed   By: Sandi Mariscal M.D.   On: 08/16/2018 16:49   [name] Ekg Site Rite  Result Date: 08/15/2018 If Site Rite image not attached, placement could not be confirmed due to current cardiac rhythm.     Subjective: PAIN controlled.   Discharge Exam: Vitals:   08/23/18 1430 08/23/18 1536  BP: 135/77   Pulse: 87 83  Resp: 16 14  Temp: 98.4 F (36.9 C)   SpO2: 100% 96%   Vitals:   08/23/18 0429 08/23/18 1137 08/23/18 1430 08/23/18 1536  BP: 120/71  135/77   Pulse: 79 81 87 83  Resp: '14 15 16 14  '$ Temp: 100.1 F (37.8 C)  98.4 F (36.9 C)   TempSrc: Oral  Oral   SpO2: 95% 97% 100% 96%  Weight: 44.5 kg     Height:        General: Pt is alert, awake, not in acute distress Cardiovascular: RRR, S1/S2 +, no rubs, no gallops Respiratory: CTA bilaterally, no wheezing, no rhonchi Abdominal: Soft, NT, ND, bowel sounds + Extremities: no edema, no cyanosis    The results of significant diagnostics from this hospitalization (including imaging, microbiology, ancillary and laboratory) are listed below for reference.     Microbiology: Recent Results (from the past 240 hour(s))  Culture, Urine     Status: None   Collection Time: 08/19/18 12:12 PM   Specimen: Urine, Catheterized  Result Value Ref Range Status   Specimen Description   Final    URINE, CATHETERIZED Performed at [facility] [address], [place], [place] [address]    Special Requests   Final    NONE Performed at [facility], [place] [address], [place], [place] [address]    Culture   Final    NO GROWTH Performed at [facility], [place] [address] [place], [place] [address]    Report Status 08/20/2018 FINAL  Final     Labs: BNP (last 3 results) Recent Labs    12/16/17 0307 02/01/18 2014  BNP 280.4* 45.0   Basic Metabolic Panel: Recent Labs  Lab 08/18/18 0441 08/19/18 0416 08/20/18 1110  08/20/18 1539 08/21/18 0331 08/21/18 1700 08/22/18 0429 08/23/18 0344  NA 135 135 135  --   --   --  135 137  K 4.2 4.1 4.0  --   --   --  3.6 3.9  CL 103 102 102  --   --   --  99 103  CO2 '23 25 26  '$ --   --   --  27 25  GLUCOSE 135* 108* 145*  --   --   --  131* 148*  BUN '13 17 15  '$ --   --   --  17 13  CREATININE 0.30* 0.33* 0.34*  --   --   --  0.42* 0.40*  CALCIUM 8.7* 8.8* 8.9  --   --   --  8.8* 8.9  MG 2.0 2.0 1.8 1.8 1.9 1.8  --   --   PHOS 3.0 3.6 3.7 3.9 3.8 4.3  --   --    Liver Function Tests: Recent Labs  Lab 08/19/18 0416  AST 14*  ALT 12  ALKPHOS 83  BILITOT 0.4  PROT 7.1  ALBUMIN 3.3*   No results for input(s): LIPASE, AMYLASE in the last 168 hours. No results for input(s): AMMONIA in the last 168 hours. CBC: Recent Labs  Lab 08/17/18 0506 08/19/18 0958 08/22/18 0429  WBC 8.5 11.4* 8.5  HGB 9.0* 9.6* 8.6*  HCT 30.4* 31.5* 29.2*  MCV 95.0 93.2 93.9  PLT 292 329 310   Cardiac Enzymes: No results for input(s): CKTOTAL, CKMB, CKMBINDEX, TROPONINI in the last 168 hours. BNP: Invalid input(s): POCBNP CBG: Recent Labs  Lab 08/23/18 0023 08/23/18 0426 08/23/18 0801 08/23/18 1231 08/23/18 1639  GLUCAP 107* 140* 114* 124* 159*   D-Dimer No results for input(s): DDIMER in the last 72 hours. Hgb A1c No results for input(s): HGBA1C in the last 72 hours. Lipid Profile No results for input(s): CHOL, HDL, LDLCALC, TRIG, CHOLHDL, LDLDIRECT in the last 72 hours. Thyroid function studies No results for input(s): TSH, T4TOTAL, T3FREE, THYROIDAB in the last 72 hours.  Invalid input(s): FREET3 Anemia work up No results for input(s): VITAMINB12, FOLATE, FERRITIN, TIBC, IRON, RETICCTPCT in the last 72 hours. Urinalysis    Component Value Date/Time   COLORURINE YELLOW 08/19/2018 1212   APPEARANCEUR CLEAR 08/19/2018 1212   LABSPEC 1.011 08/19/2018 1212   PHURINE 7.0 08/19/2018 1212   GLUCOSEU NEGATIVE 08/19/2018 1212   GLUCOSEU NEGATIVE 04/28/2017 1121    HGBUR SMALL (A) 08/19/2018 1212   BILIRUBINUR NEGATIVE 08/19/2018 1212   KETONESUR NEGATIVE 08/19/2018 1212   PROTEINUR 30 (A) 08/19/2018 1212   UROBILINOGEN 0.2 04/28/2017 1121   NITRITE NEGATIVE 08/19/2018 1212   LEUKOCYTESUR SMALL (A) 08/19/2018 1212   Sepsis Labs Invalid input(s): PROCALCITONIN,  WBC,  LACTICIDVEN Microbiology Recent Results (from the past 240 hour(s))  Culture, Urine     Status: None   Collection Time: 08/19/18 12:12 PM   Specimen: Urine, Catheterized  Result Value Ref Range Status   Specimen Description   Final    URINE, CATHETERIZED Performed at [facility], [place] [address] [place], [place] [address]    Special Requests   Final    NONE Performed at [facility], [place] [address], [place], [place] [address]    Culture   Final    NO GROWTH Performed at [facility], [place] [address], [place], [place] [address]    Report Status 08/20/2018 FINAL  Final     Time coordinating discharge: 32 minutes  SIGNED:   Hosie Poisson, MD  [facility] 08/23/2018, 5:53 PM Pager   If 7PM-7AM, please contact night-coverage [email or web address] Password [email or web address]

## 2018-08-23 NOTE — Plan of Care (Signed)
  Problem: Clinical Measurements: Goal: Will remain free from infection Outcome: Progressing Goal: Diagnostic test results will improve Outcome: Progressing Goal: Cardiovascular complication will be avoided Outcome: Progressing   Problem: Activity: Goal: Risk for activity intolerance will decrease Outcome: Progressing   Problem: Nutrition: Goal: Adequate nutrition will be maintained Outcome: Progressing   Problem: Coping: Goal: Level of anxiety will decrease Outcome: Progressing   Problem: Elimination: Goal: Will not experience complications related to bowel motility Outcome: Progressing   Problem: Pain Managment: Goal: General experience of comfort will improve Outcome: Progressing   Problem: Safety: Goal: Ability to remain free from injury will improve Outcome: Progressing   Problem: Skin Integrity: Goal: Risk for impaired skin integrity will decrease Outcome: Progressing

## 2018-08-24 LAB — GLUCOSE, CAPILLARY
Glucose-Capillary: 106 mg/dL — ABNORMAL HIGH (ref 70–99)
Glucose-Capillary: 143 mg/dL — ABNORMAL HIGH (ref 70–99)
Glucose-Capillary: 147 mg/dL — ABNORMAL HIGH (ref 70–99)
Glucose-Capillary: 99 mg/dL (ref 70–99)

## 2018-08-24 MED ORDER — OXYCODONE HCL 5 MG PO CAPS: 5.0000 mg | ORAL_CAPSULE | Freq: Three times a day (TID) | ORAL | 0 refills | Status: AC | PRN

## 2018-08-24 MED ORDER — ZOLPIDEM TARTRATE 5 MG PO TABS: 2.5000 mg | ORAL_TABLET | Freq: Every day | ORAL | 0 refills | Status: AC

## 2018-08-24 MED ORDER — ACETAMINOPHEN 500 MG PO TABS: 500.0000 mg | ORAL_TABLET | Freq: Three times a day (TID) | ORAL | 0 refills | Status: AC | PRN

## 2018-08-24 NOTE — Progress Notes (Signed)
Per MD, tube feedings were to be increased to 30 mL/h from 77mL/h. Pt was advised of this and refused for RN to increase feedings because she "felt full". Tube feedings left at 20 mL/h and pt advised to call RN to room if symptoms worsened. Will continue to monitor the pt.

## 2018-08-24 NOTE — Plan of Care (Signed)
  Problem: Clinical Measurements: Goal: Will remain free from infection Outcome: Progressing Goal: Diagnostic test results will improve Outcome: Progressing Goal: Cardiovascular complication will be avoided Outcome: Progressing   Problem: Activity: Goal: Risk for activity intolerance will decrease Outcome: Progressing   Problem: Nutrition: Goal: Adequate nutrition will be maintained Outcome: Progressing   Problem: Coping: Goal: Level of anxiety will decrease Outcome: Progressing   Problem: Elimination: Goal: Will not experience complications related to bowel motility Outcome: Progressing   Problem: Pain Managment: Goal: General experience of comfort will improve Outcome: Progressing   Problem: Safety: Goal: Ability to remain free from injury will improve Outcome: Progressing   Problem: Skin Integrity: Goal: Risk for impaired skin integrity will decrease Outcome: Progressing

## 2018-08-24 NOTE — TOC Transition Note (Addendum)
Transition of Care [facility]) - CM/SW Discharge Note   Patient Details  Name: Angela Page MRN: [id number] Date of Birth: [birth year]  [facility] CM/SW Contact:  Lia Hopping, [place] Phone Number: 08/24/2018, 12:12 PM   Clinical Narrative:    Patient returning to [facility] Nurse call report [phone] room:123A CSW left voicemail for the patient mother and notified pt. brother    Final next level of care: Skilled Nursing Facility Barriers to Discharge: No Barriers Identified   Patient Goals and CMS Choice Patient states their goals for this hospitalization and ongoing recovery are:: Return to [email or web address].gov Compare Post Acute Care list provided to:: Patient Choice offered to / list presented to : NA  Discharge Placement              Patient chooses bed at: [facility]) Patient to be transferred to facility by: [facility]   Patient and family notified of of transfer: 08/24/18  Discharge Plan and Services   Discharge Planning Services: CM Consult Post Acute Care Choice: [place]                               Social Determinants of Health (SDOH) Interventions     Readmission Risk Interventions Readmission Risk Prevention Plan 08/17/2018 06/24/2018  Transportation Screening Complete Complete  Medication Review [employer]) Complete Complete  PCP or Specialist appointment within 3-5 days of discharge Complete -  [place] or [place] Not Complete Complete  HRI or Home Care Consult Pt Refusal Comments not applicable from SNF -  SW Recovery Care/Counseling Consult Complete Complete  Palliative Care Screening Not Applicable Not Applicable  Skilled Nursing Facility Complete Complete  Some recent data might be hidden

## 2018-08-24 NOTE — Discharge Summary (Signed)
Physician Discharge Summary  Angela Page JOA:[id number] DOB: 09/06/1970 DOA: 08/10/2018  PCP: Default, Provider, MD  Admit date: 08/10/2018 Discharge date: 08/24/2018  Admitted From: snf Disposition:  SNF   Recommendations for Outpatient Follow-up:  1. Follow up with PCP in 1-2 weeks 2. Please obtain BMP/CBC in one week 3. Please follow up with surgery as needed     Discharge Condition:GUARDED.  CODE STATUS FULL CODE.  Diet recommendation: Tube feeds.   Brief/Interim Summary: 48 y.o.femalewith medical history significant oftraumatic C spine fracture resulting in quadriplegia s/p trach, peg, suprapubic catheter as well as COPD, chronic hypotension, T2DM, chronic pain, SSS s/p pacemaker, and chronic decubitus ulcers presenting with abdominal pain, worsening With no vomiting until in ED.    Imaging concerning for SBO and rectus sheath hematoma. Labs with anemia. Surgery c/s. Labs, imaging. Abx for possible UTI. Patient was admitted, seen by surgery, being managed conservatively.  Abdominal x-ray shows resolution of SBO  Discharge Diagnoses:  Principal Problem:   SBO (small bowel obstruction) (HCC) Active Problems:   Quadriplegia (HCC)   Symptomatic anemia   Rectus sheath hematoma, initial encounter  Bowel obstruction versus ileus from rectus sheath hematoma abd X ray shows resolution of the SBO. Pt had bowel movement yesterday. But overnight pt had abd pain and had resistance to tube feeds, TF were held. An abd x ray this am ruled out SBO or ileus, and tube feeds were restarted.  slowly in crease the rate , so she can tolerate the feeds.  Appreciate surgery recommendations. Replete electrolytes as appropriate. TPN discontinued .     Rectus sheath hematoma CT of the abdomen shows hematoma 17.5 x 13.0 x 7.5 cm. Transfuse to keep hemoglobin greater than 7. Repeat CBC in am.  Patient received 2 units of PRBC so far.    Acute blood loss anemia probably  secondary to rectal sheath hematoma Transfuse to keep hemoglobin greater than 7. Continue to monitor daily CBCs. Hemoglobin around 8.6    History of quadriplegia status post trach, PEG, suprapubic catheter secondary to traumatic C-spine fracture Continue with supportive care with trial of trach collar, supplemental oxygen as needed to keep sats greater than 90%, pulmonary toilet.   Metabolic acidosis Resolved.    Type 2 diabetes mellitus Resume SSI. No change in meds.     Hypophosphatemia and hypokalemia Replaced as appropriate.   Abnormal UA Completed 5 days of antibiotics. Repeat cultures ordered and are negative.  Exchange supra pubic catheter on 08/22/2018      Depression/insomnia Continue with Cymbalta and Wellbutrin.     Hypotension Resolved.    COPD Continue with home bronchodilators.     Discharge Instructions  Discharge Instructions    Diet - low sodium heart healthy   Complete by: As directed    Discharge instructions   Complete by: As directed    Please followup with PCP in one week.     Allergies as of 08/24/2018      Reactions   Penicillins Hives   Has patient had a PCN reaction causing immediate rash, facial/tongue/throat swelling, SOB or lightheadedness with hypotension: Yes Has patient had a PCN reaction causing severe rash involving mucus membranes or skin necrosis: Yes Has patient had a PCN reaction that required hospitalization No Has patient had a PCN reaction occurring within the last 10 years: No-childhood allergy If all of the above answers are "NO", then may proceed with Cephalosporin  Tolerated cephalosporins in past   Erythromycin Nausea And Vomiting   Nitrofurantoin  Monohyd Macro Nausea And Vomiting   Oxybutynin Other (See Comments)   Dries mouth out       Medication List    STOP taking these medications   enoxaparin 40 MG/0.4ML injection Commonly known as: LOVENOX   famotidine 20 MG  tablet Commonly known as: PEPCID   levofloxacin 750 MG tablet Commonly known as: LEVAQUIN     TAKE these medications   acetaminophen 500 MG tablet Commonly known as: TYLENOL Take 1 tablet (500 mg total) by mouth every 8 (eight) hours as needed for mild pain.   baclofen 5 mg Tabs tablet Commonly known as: LIORESAL Take 5 mg by mouth 3 (three) times daily.   Basaglar KwikPen 100 UNIT/ML Sopn Inject 0.05 mLs (5 Units total) into the skin at bedtime.   bisacodyl 10 MG suppository Commonly known as: DULCOLAX Place 1 suppository (10 mg total) rectally daily as needed for mild constipation.   buPROPion 150 MG 12 hr tablet Commonly known as: WELLBUTRIN SR Take 150 mg by mouth daily.   docusate 50 MG/5ML liquid Commonly known as: COLACE Place 10 mLs (100 mg total) into feeding tube daily.   DULoxetine 30 MG capsule Commonly known as: CYMBALTA Take 1 capsule (30 mg total) by mouth 2 (two) times daily.   ProMod Liqd Take 30 mLs by mouth 3 (three) times daily. What changed:   Another medication with the same name was added. Make sure you understand how and when to take each.  Another medication with the same name was changed. Make sure you understand how and when to take each.   feeding supplement (GLUCERNA 1.2 CAL) Liqd Place 1,000 mLs into feeding tube daily. What changed:   when to take this  additional instructions   feeding supplement (GLUCERNA 1.2 CAL) Liqd Place 400 mLs into feeding tube daily. What changed: You were already taking a medication with the same name, and this prescription was added. Make sure you understand how and when to take each.   feeding supplement (PRO-STAT SUGAR FREE 64) Liqd Place 30 mLs into feeding tube 3 (three) times daily.   ferrous sulfate 300 (60 Fe) MG/5ML syrup Place 3.7 mLs (220 mg total) into feeding tube daily with breakfast. What changed: Another medication with the same name was removed. Continue taking this medication, and  follow the directions you see here.   gabapentin 300 MG capsule Commonly known as: NEURONTIN Take 300 mg by mouth 3 (three) times daily.   insulin aspart 100 UNIT/ML injection Commonly known as: novoLOG Inject 10 Units into the skin every 4 (four) hours. For glucose 150 to 200 use 1 units, for glucose 201-250 use 2 units, for 251 to 300 use 4 units, for 301 to 350 use 6 units, for 351 to 400 use 8 units, for 401 or greater use 10 units. What changed:   how much to take  when to take this  additional instructions   lip balm ointment Apply topically as needed for lip care.   loratadine 10 MG tablet Commonly known as: CLARITIN Take 10 mg by mouth daily.   metoCLOPramide 5 MG tablet Commonly known as: REGLAN Place 5 mg into feeding tube 4 (four) times daily.   midodrine 10 MG tablet Commonly known as: PROAMATINE Take 10 mg by mouth 3 (three) times daily as needed (hypotension).   oxycodone 5 MG capsule Commonly known as: OXY-IR Take 1 capsule (5 mg total) by mouth every 8 (eight) hours as needed for pain.   OXYGEN Inhale  3 L into the lungs continuous.   pantoprazole 40 MG tablet Commonly known as: PROTONIX Take 40 mg by mouth 2 (two) times daily.   polyethylene glycol 17 g packet Commonly known as: MIRALAX / GLYCOLAX Take 17 g by mouth daily. Hold if diarrhea.   tiotropium 18 MCG inhalation capsule Commonly known as: SPIRIVA Place 18 mcg into inhaler and inhale daily.   Trulicity 1.5 ME/2.6ST Sopn Generic drug: Dulaglutide Inject 1.5 mg into the skin every Friday.   zolpidem 5 MG tablet Commonly known as: AMBIEN Take 0.5 tablets (2.5 mg total) by mouth at bedtime.      Contact information for after-discharge care    Destination    [facility] .   Service: Skilled Nursing Contact information: [address] [address] [phone]             Allergies  Allergen Reactions  . Penicillins Hives     Has patient had a PCN reaction causing immediate rash, facial/tongue/throat swelling, SOB or lightheadedness with hypotension: Yes Has patient had a PCN reaction causing severe rash involving mucus membranes or skin necrosis: Yes Has patient had a PCN reaction that required hospitalization No Has patient had a PCN reaction occurring within the last 10 years: No-childhood allergy If all of the above answers are "NO", then may proceed with Cephalosporin  Tolerated cephalosporins in past  . Erythromycin Nausea And Vomiting  . Nitrofurantoin Monohyd Macro Nausea And Vomiting  . Oxybutynin Other (See Comments)    Dries mouth out     Consultations:  Surgery.    Procedures/Studies: Ct Abdomen Pelvis W Contrast  Result Date: 08/10/2018 CLINICAL DATA:  Abdominal pain since this morning. EXAM: CT ABDOMEN AND PELVIS WITH CONTRAST TECHNIQUE: Multidetector CT imaging of the abdomen and pelvis was performed using the standard protocol following bolus administration of intravenous contrast. CONTRAST:  31m OMNIPAQUE IOHEXOL 300 MG/ML  SOLN COMPARISON:  12/16/2017 FINDINGS: Lower chest: Bibasilar atelectasis and scarring changes. No definite infiltrates or effusions. The heart is normal in size. No pericardial effusion. Pacer wires are noted. Hepatobiliary: No focal hepatic lesions or intrahepatic biliary dilatation. The gallbladder demonstrates small gallstones but no findings for acute cholecystitis. No common bile duct dilatation. Pancreas: No mass, inflammation or ductal dilatation. Spleen: Normal size.  No focal lesions. Adrenals/Urinary Tract: The adrenal glands and kidneys are unremarkable. There is a lower pole left renal calculus noted but no obstructing ureteral calculi or bladder calculi. No obvious bladder lesions. Patient has a suprapubic urinary catheter in place. Stomach/Bowel: The stomach contains a feeding gastrostomy tube. No complicating features are identified. There is mild distention of  the stomach with fluid. The duodenum is unremarkable. The proximal and mid small bowel loops are dilated and demonstrate air-fluid levels consistent with obstruction. There appears to be a transition to nondilated/decompressed loops of small bowel in the upper pelvis in the midline (series 2, image 53 and series 5 image 49, 50 and 51). This is likely due to adhesions from prior surgery. Moderate stool in the colon and down into the rectum. No colonic mass or obstruction. The terminal ileum appears normal. Vascular/Lymphatic: Advanced atherosclerotic calcifications involving the aorta iliac arteries but no aneurysm. The branch vessels are patent. The major venous structures are patent. Small scattered mesenteric and retroperitoneal lymph nodes but no mass or overt adenopathy. Reproductive: The uterus and ovaries are unremarkable. Other: There is a large left rectus sheath hematoma measuring approximately 17.5 x 13.0 x 7.5  cm. Musculoskeletal: No significant bony findings. Advanced osteoporosis for age. A sacral decubitus ulcer is noted and extends down very close to the bone. I do not see any obvious destructive bony changes or ever it appears that the lower sacrum and coccyx have been previously resected. IMPRESSION: 1. CT findings consistent with a fairly high-grade small-bowel obstruction in the mid small bowel region in the central abdomen at the level of the iliac crest. This is most likely due to adhesions. 2. Very large left rectus sheath hematoma. 3. Feeding gastrostomy tube and suprapubic catheter is in good position without complicating features. 4. Sacral decubitus ulcer. 5. Marked age advanced vascular calcifications for age. 6. Lower pole left renal calculus but no obstructing ureteral calculi. Electronically Signed   By: Marijo Sanes M.D.   On: 08/10/2018 16:28   Dg Chest Port 1 View  Result Date: 08/19/2018 CLINICAL DATA:  Low-grade fever EXAM: PORTABLE CHEST 1 VIEW COMPARISON:  Nine days ago  FINDINGS: Tracheostomy tube in place. Right PICC with tip at the distal SVC. Dual-chamber pacer leads from the left in stable position. Patient's right hand overlaps the right lung base. There is no edema, consolidation, effusion, or pneumothorax. Normal heart size and mediastinal contours. IMPRESSION: No evidence of pneumonia or other acute process. Electronically Signed   By: Monte Fantasia M.D.   On: 08/19/2018 11:50   Dg Chest Portable 1 View  Result Date: 08/10/2018 CLINICAL DATA:  Abdominal pain since 0300 hours this morning, nausea, single episode of vomiting, abdominal distension today, quadriplegia EXAM: PORTABLE CHEST 1 VIEW COMPARISON:  Portable exam 1403 hours compared to 07/20/2018 FINDINGS: LEFT subclavian pacemaker with leads projecting over RIGHT atrium and RIGHT ventricle. Tracheostomy tube projects over tracheal air column. Normal heart size, mediastinal contours, and pulmonary vascularity. RIGHT basilar scarring. Chronic accentuation of retrocardiac LEFT lower lobe markings unchanged since 06/22/2018 Remaining lungs clear. No pleural effusion or pneumothorax. No acute osseous findings. IMPRESSION: Chronic bibasilar changes without definite acute infiltrate. Electronically Signed   By: Lavonia Dana M.D.   On: 08/10/2018 14:52   Dg Abd Portable 1v  Result Date: 08/22/2018 CLINICAL DATA:  Pt is c/o pain to site of gastric tube as well as generalized pain to left lower abdomen. H/o quadriplegia, best obtainable images. EXAM: PORTABLE ABDOMEN - 1 VIEW COMPARISON:  08/18/2018 FINDINGS: Residual contrast noted in the colon. No evidence of bowel obstruction. Gastrostomy tube seen projecting along the left mid abdomen. Given the patient's positioning, this may reside in the stomach, but this is not conclusive. IMPRESSION: 1. No evidence of bowel obstruction. 2. Gastrostomy tube position cannot be confirmed within the stomach based on the provided images. Electronically Signed   By: Lajean Manes  M.D.   On: 08/22/2018 14:57   Dg Abd Portable 1v  Result Date: 08/18/2018 CLINICAL DATA:  Abdominal distension. History of small-bowel obstruction. EXAM: PORTABLE ABDOMEN - 1 VIEW COMPARISON:  Multiple previous abdominal films. Most recent is yesterday. FINDINGS: Most of the contrast has now passed into the colon. There are some persistent air-filled loops of small bowel but no significant distension. No free air. Stable position of the feeding gastrostomy tube. IMPRESSION: Interval resolution of small-bowel obstruction with passage of contrast into the colon. Electronically Signed   By: Marijo Sanes M.D.   On: 08/18/2018 08:16   Dg Abd Portable 1v  Result Date: 08/17/2018 CLINICAL DATA:  Small bowel obstruction EXAM: PORTABLE ABDOMEN - 1 VIEW COMPARISON:  08/15/2018 FINDINGS: A small amount of contrast is  identified within small bowel loops in the LEFT mid abdomen. Persistent dilatation of small bowel loops is noted. Large bowel loops are not dilated. Patient is gastrostomy tube in the LEFT UPPER QUADRANT. Chronic deformities of the hips. IMPRESSION: Persistent small bowel dilatation. Small amount of contrast within small bowel loops. Electronically Signed   By: Nolon Nations M.D.   On: 08/17/2018 08:06   Dg Abd Portable 1v  Result Date: 08/15/2018 CLINICAL DATA:  48 year old female with history of small-bowel obstruction. EXAM: PORTABLE ABDOMEN - 1 VIEW COMPARISON:  Abdominal radiograph 08/13/2018. FINDINGS: Percutaneous gastrostomy tube with retention balloon projecting over the epigastric region. Contrast in the stomach. Severe dilatation of small bowel loops which are gas-filled projecting over the mid abdomen measuring up to 4.6 cm in diameter. Paucity of colonic and rectal gas. No gross evidence of pneumoperitoneum. Pacemaker leads projecting over the lower thorax. Surgical clips projecting over the right lower abdomen. IMPRESSION: 1. The appearance of the abdomen remains compatible with small  bowel obstruction. 2. Postoperative changes and support apparatus, as above. Electronically Signed   By: Vinnie Langton M.D.   On: 08/15/2018 06:24   Dg Abd Portable 1v  Result Date: 08/13/2018 CLINICAL DATA:  Small-bowel obstruction. EXAM: PORTABLE ABDOMEN - 1 VIEW COMPARISON:  Radiographs of August 12, 2018. FINDINGS: Grossly stable small bowel dilatation is noted centrally. Residual contrast is noted within the gastric fundus. Gastrostomy tube is unchanged in position. IMPRESSION: Grossly stable small bowel dilatation is noted concerning for distal small bowel obstruction. Electronically Signed   By: Marijo Conception M.D.   On: 08/13/2018 08:08   Dg Abd Portable 1v  Result Date: 08/12/2018 CLINICAL DATA:  Abdominal pain, abdominal distention. EXAM: PORTABLE ABDOMEN - 1 VIEW COMPARISON:  Radiograph of August 11, 2018. FINDINGS: Stable small bowel dilatation is noted concerning for distal small bowel obstruction. No colonic dilatation is noted. Residual contrast remains within the gastric fundus. Gastrostomy tube is again noted and unchanged. IMPRESSION: Stable small bowel dilatation is noted concerning for distal small bowel obstruction. Electronically Signed   By: Marijo Conception M.D.   On: 08/12/2018 12:01   Dg Abd Portable 1v-small Bowel Obstruction Protocol-initial, 8 Hr Delay  Result Date: 08/11/2018 CLINICAL DATA:  Small bowel obstruction. EXAM: PORTABLE ABDOMEN - 1 VIEW COMPARISON:  CT scan dated 08/10/2018 FINDINGS: There are multiple persistent dilated loops of small bowel. Contrast is also seen in the fundus of the stomach. Gastrostomy tube in place. Colon does not appear distended. IMPRESSION: Persistent small bowel obstruction, slightly progressed. Electronically Signed   By: Lorriane Shire M.D.   On: 08/11/2018 18:11   Ir Picc Placement Right >5 Yrs Inc Img Guide  Result Date: 08/16/2018 INDICATION: Poor venous access. In need of durable intravenous access for the initiation of TPN. EXAM:  ULTRASOUND AND FLUOROSCOPIC GUIDED PICC LINE INSERTION MEDICATIONS: None. CONTRAST:  None FLUOROSCOPY TIME:  24 seconds (1 mGy) COMPLICATIONS: None immediate. TECHNIQUE: The procedure, risks, benefits, and alternatives were explained to the patient and informed written consent was obtained. A timeout was performed prior to the initiation of the procedure. 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 As such, the right upper extremity was prepped with chlorhexidine in a sterile fashion, and a sterile drape was applied covering the operative field. Maximum barrier sterile technique with sterile gowns and gloves were used for the procedure. A timeout was performed prior to the initiation  of the procedure. Local anesthesia was provided with 1% lidocaine. Under direct ultrasound guidance, the brachial vein was accessed with a micropuncture kit after the overlying soft tissues were anesthetized with 1% lidocaine. After the overlying soft tissues were anesthetized, a small venotomy incision was created and a micropuncture kit was utilized to access the right brachial vein. Real-time ultrasound guidance was utilized for vascular access including the acquisition of a permanent ultrasound image documenting patency of the accessed vessel. A guidewire was advanced to the level of the superior caval-atrial junction for measurement purposes and the PICC line was cut to length. A peel-away sheath was placed and a 37 cm, 5 [place], dual lumen was inserted to level of the superior caval-atrial junction. A post procedure spot fluoroscopic was obtained. The catheter easily aspirated and flushed and was sutured in place. A dressing was placed. The patient tolerated the procedure well without immediate post procedural complication. FINDINGS: After catheter placement, the tip lies within the superior cavoatrial junction. The catheter aspirates and  flushes normally and is ready for immediate use. IMPRESSION: Successful ultrasound and fluoroscopic guided placement of a right brachial vein approach, 37 cm, 5 French, dual lumen PICC with tip at the superior caval-atrial junction. The PICC line is ready for immediate use. Electronically Signed   By: Sandi Mariscal M.D.   On: 08/16/2018 16:49   [name] Ekg Site Rite  Result Date: 08/15/2018 If Site Rite image not attached, placement could not be confirmed due to current cardiac rhythm.     Subjective: PAIN controlled.   Discharge Exam: Vitals:   08/24/18 0446 08/24/18 0748  BP: 127/75   Pulse: 81 85  Resp: 16 15  Temp: 99.4 F (37.4 C)   SpO2: 91% 92%   Vitals:   08/24/18 0347 08/24/18 0446 08/24/18 0458 08/24/18 0748  BP:  127/75    Pulse: 80 81  85  Resp: _0 Temp:  99.4 F (37.4 C)    TempSrc:  Oral    SpO2: 93% 91%  92%  Weight:   44 kg   Height:        General: Pt is alert, awake, not in acute distress Cardiovascular: RRR, S1/S2 +, no rubs, no gallops Respiratory: CTA bilaterally, no wheezing, no rhonchi Abdominal: Soft, NT, ND, bowel sounds + Extremities: no edema, no cyanosis    The results of significant diagnostics from this hospitalization (including imaging, microbiology, ancillary and laboratory) are listed below for reference.     Microbiology: Recent Results (from the past 240 hour(s))  Culture, Urine     Status: None   Collection Time: 08/19/18 12:12 PM   Specimen: Urine, Catheterized  Result Value Ref Range Status   Specimen Description   Final    URINE, CATHETERIZED Performed at [facility] [address] [place], [place] [address]    Special Requests   Final    NONE Performed at [facility], [place] [address] [place], [place] [address]    Culture   Final    NO GROWTH Performed at [facility], [place] [address] [place], [place] [address]    Report Status 08/20/2018 FINAL  Final      Labs: BNP (last 3 results) Recent Labs    12/16/17 0307 02/01/18 2014  BNP 280.4* 63.8   Basic Metabolic Panel: Recent Labs  Lab 08/18/18 0441 08/19/18 0416 08/20/18 1110 08/20/18 1539 08/21/18 0331 08/21/18 1700 08/22/18 0429 08/23/18 0344  NA 135 135  135  --   --   --  135 137  K 4.2 4.1 4.0  --   --   --  3.6 3.9  CL 103 102 102  --   --   --  99 103  CO2 _0 --   --   --  27 25  GLUCOSE 135* 108* 145*  --   --   --  131* 148*  BUN _1 --   --   --  17 13  CREATININE 0.30* 0.33* 0.34*  --   --   --  0.42* 0.40*  CALCIUM 8.7* 8.8* 8.9  --   --   --  8.8* 8.9  MG 2.0 2.0 1.8 1.8 1.9 1.8  --   --   PHOS 3.0 3.6 3.7 3.9 3.8 4.3  --   --    Liver Function Tests: Recent Labs  Lab 08/19/18 0416  AST 14*  ALT 12  ALKPHOS 83  BILITOT 0.4  PROT 7.1  ALBUMIN 3.3*   No results for input(s): LIPASE, AMYLASE in the last 168 hours. No results for input(s): AMMONIA in the last 168 hours. CBC: Recent Labs  Lab 08/19/18 0958 08/22/18 0429  WBC 11.4* 8.5  HGB 9.6* 8.6*  HCT 31.5* 29.2*  MCV 93.2 93.9  PLT 329 310   Cardiac Enzymes: No results for input(s): CKTOTAL, CKMB, CKMBINDEX, TROPONINI in the last 168 hours. BNP: Invalid input(s): POCBNP CBG: Recent Labs  Lab 08/23/18 1639 08/23/18 2059 08/24/18 0045 08/24/18 0444 08/24/18 0759  GLUCAP 159* 159* 106* 147* 143*   D-Dimer No results for input(s): DDIMER in the last 72 hours. Hgb A1c No results for input(s): HGBA1C in the last 72 hours. Lipid Profile No results for input(s): CHOL, HDL, LDLCALC, TRIG, CHOLHDL, LDLDIRECT in the last 72 hours. Thyroid function studies No results for input(s): TSH, T4TOTAL, T3FREE, THYROIDAB in the last 72 hours.  Invalid input(s): FREET3 Anemia work up No results for input(s): VITAMINB12, FOLATE, FERRITIN, TIBC, IRON, RETICCTPCT in the last 72 hours. Urinalysis    Component Value Date/Time   COLORURINE YELLOW 08/19/2018 1212   APPEARANCEUR CLEAR  08/19/2018 1212   LABSPEC 1.011 08/19/2018 1212   PHURINE 7.0 08/19/2018 1212   GLUCOSEU NEGATIVE 08/19/2018 1212   GLUCOSEU NEGATIVE 04/28/2017 1121   HGBUR SMALL (A) 08/19/2018 1212   BILIRUBINUR NEGATIVE 08/19/2018 1212   KETONESUR NEGATIVE 08/19/2018 1212   PROTEINUR 30 (A) 08/19/2018 1212   UROBILINOGEN 0.2 04/28/2017 1121   NITRITE NEGATIVE 08/19/2018 1212   LEUKOCYTESUR SMALL (A) 08/19/2018 1212   Sepsis Labs Invalid input(s): PROCALCITONIN,  WBC,  LACTICIDVEN Microbiology Recent Results (from the past 240 hour(s))  Culture, Urine     Status: None   Collection Time: 08/19/18 12:12 PM   Specimen: Urine, Catheterized  Result Value Ref Range Status   Specimen Description   Final    URINE, CATHETERIZED Performed at [facility], [place] [address] [place], [place] [address]    Special Requests   Final    NONE Performed at [facility], [place] [address], [place], [place] [address]    Culture   Final    NO GROWTH Performed at [facility], [place] [address], [place], [place] [address]    Report Status 08/20/2018 FINAL  Final     Time coordinating discharge: 32 minutes  SIGNED:   Hosie Poisson, MD  [facility] 08/24/2018, 11:29 AM Pager  If 7PM-7AM, please contact night-coverage [email or web address] Password [email or web address]

## 2018-08-24 NOTE — Plan of Care (Signed)
  Problem: Clinical Measurements: Goal: Diagnostic test results will improve Outcome: Progressing   Problem: Coping: Goal: Level of anxiety will decrease Outcome: Progressing   

## 2018-09-01 IMAGING — DX DG CHEST 2V
2 series · 2 of 2 positions shown · non-contrast
Comparison: Radiographs June 30, 2016.  CT scan June 09, 2016.

CLINICAL DATA: Preop for suprapubic catheter insertion.

EXAM:
CHEST  2 VIEW

[chest lat]
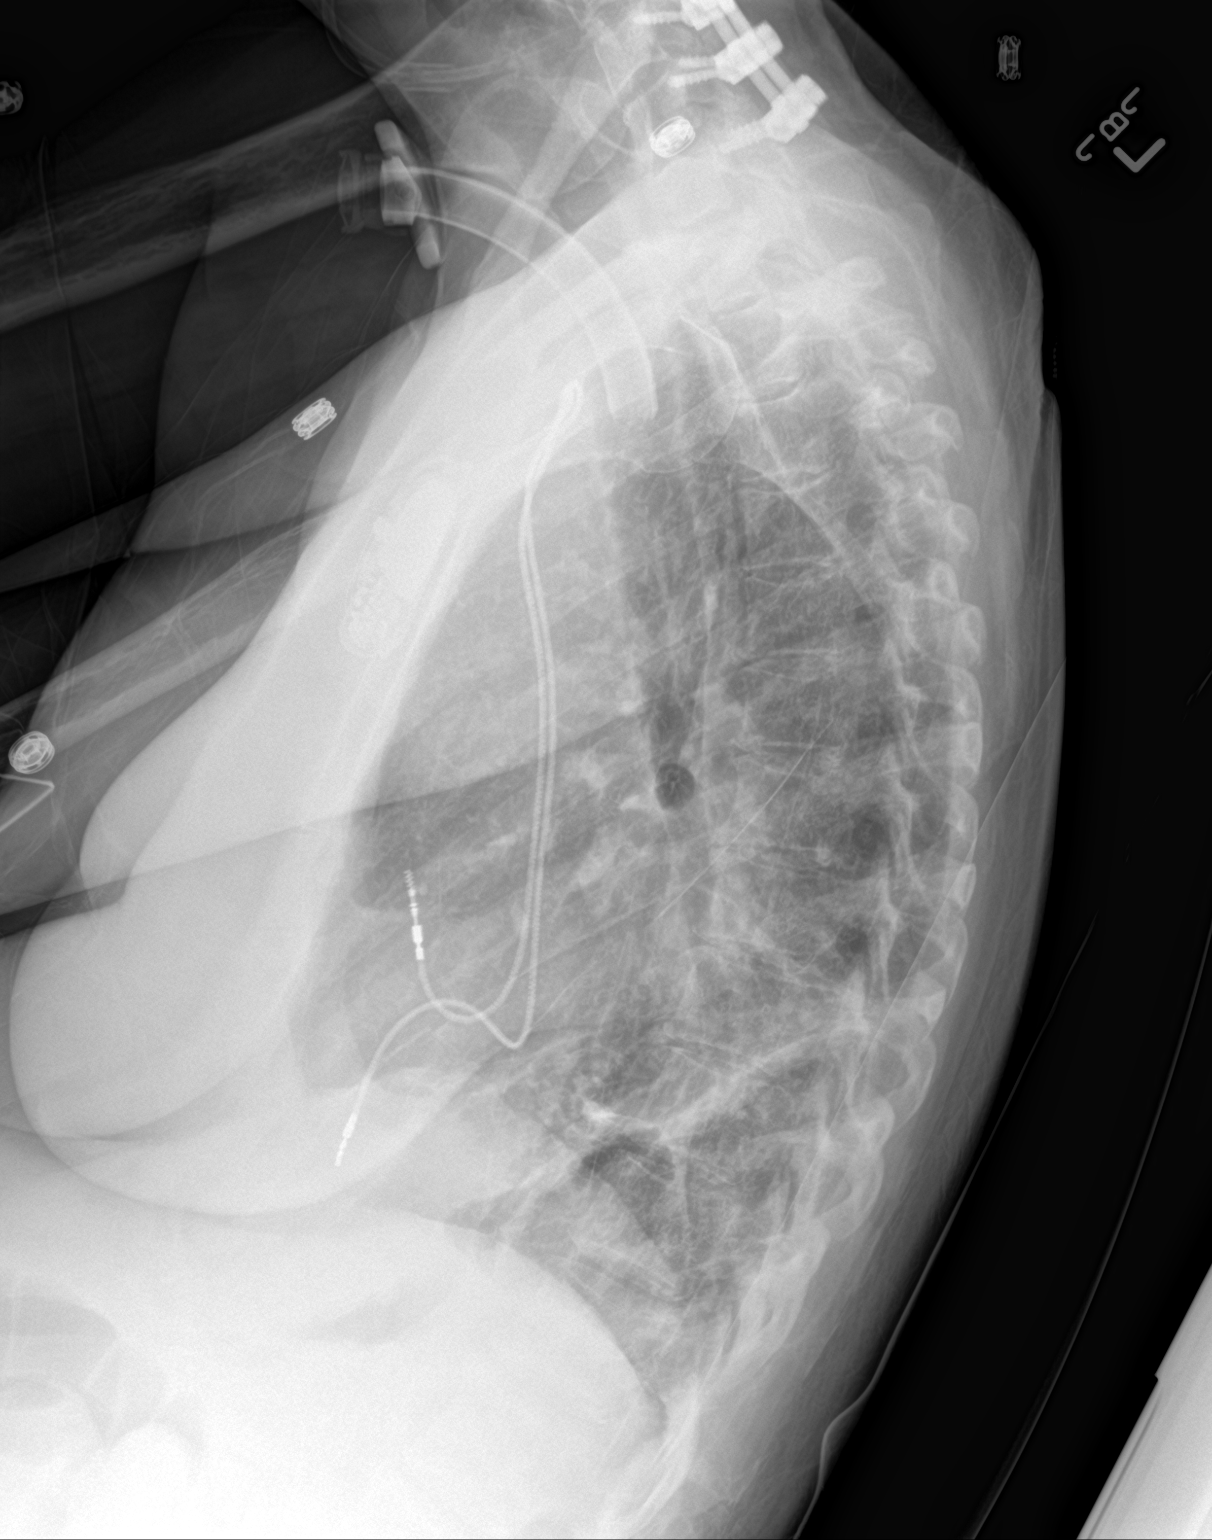

[chest ap]
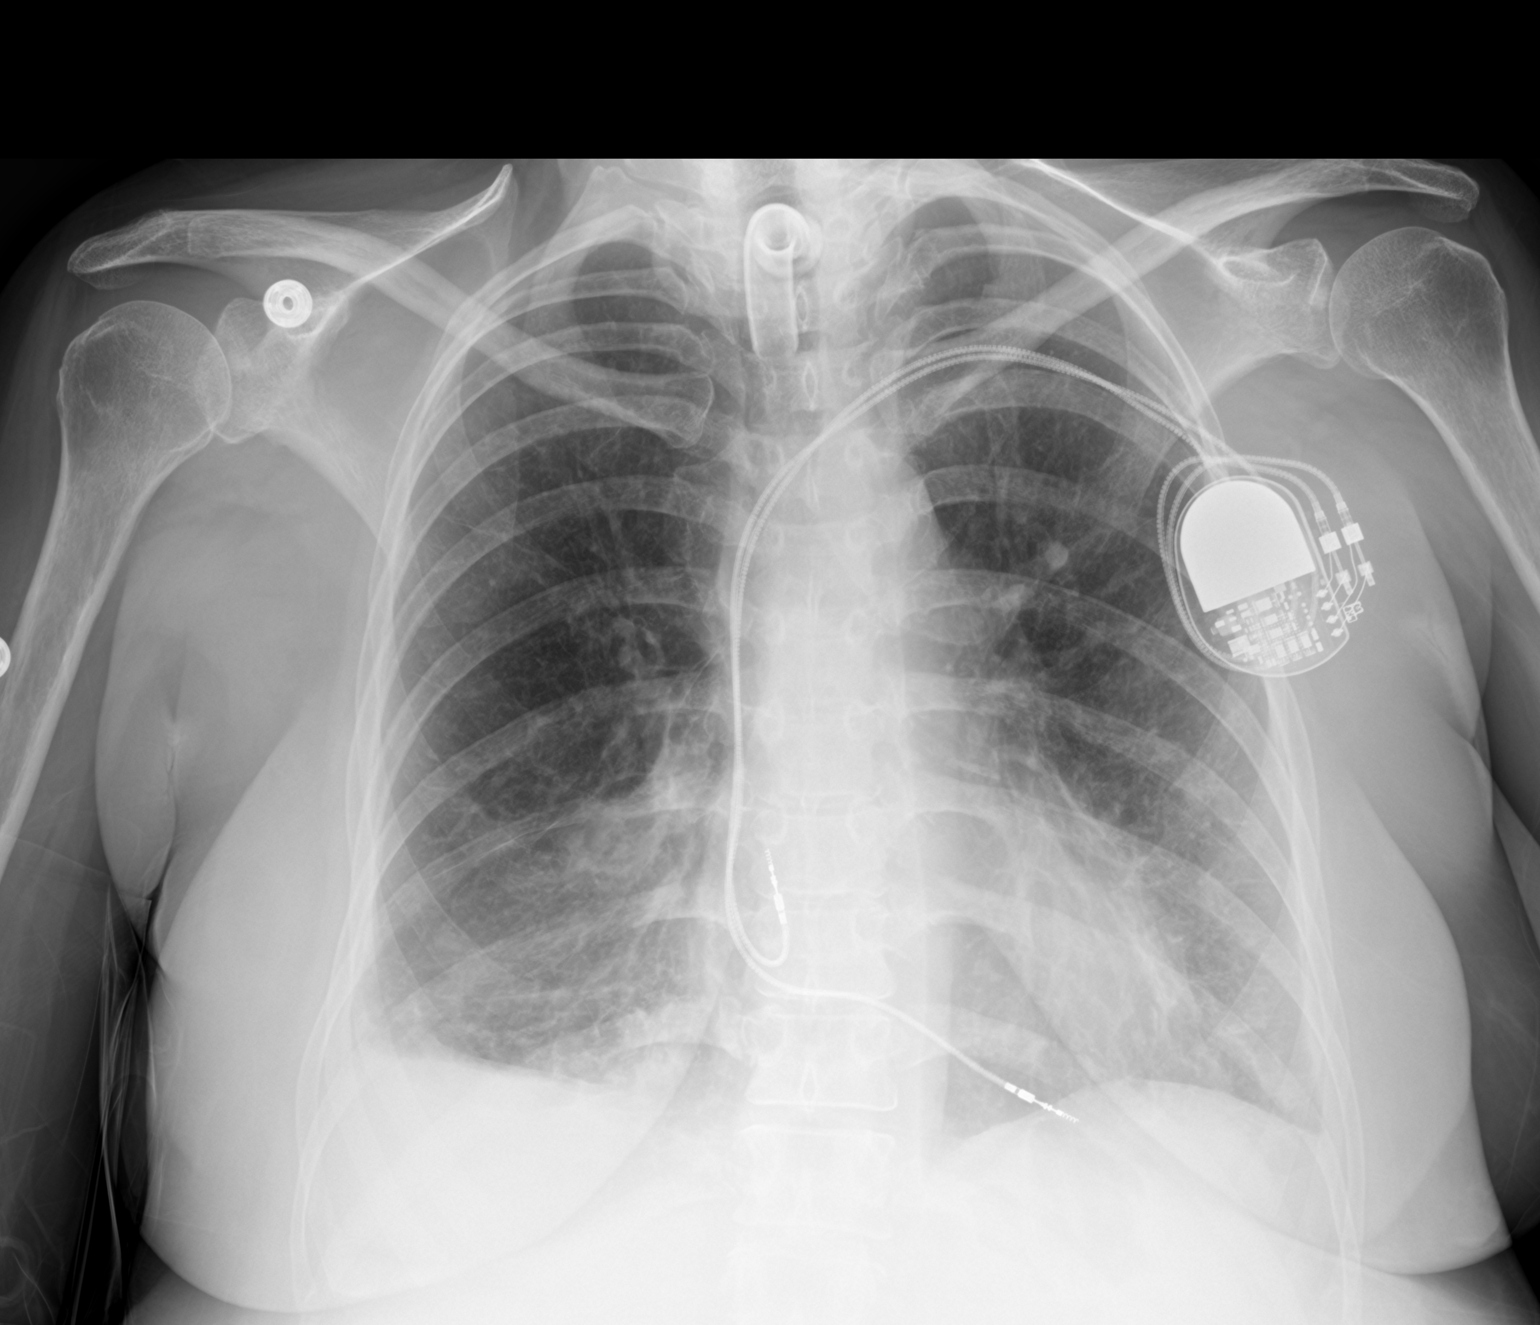

[2 of 2 positions shown; findings below may reference images not displayed]

FINDINGS: Stable cardiomediastinal silhouette. Left-sided pacemaker is
unchanged in position. Tracheostomy tube is unchanged in position.
No pneumothorax or pleural effusion is noted. Left lung is clear.
Stable right basilar opacity is noted concerning for atelectasis or
infiltrate. Bony thorax is unremarkable.
IMPRESSION: Stable right basilar opacity concerning for atelectasis or
infiltrate.

## 2018-10-06 ENCOUNTER — Ambulatory Visit: Payer: Medicare Other | Admitting: Neurology

## 2018-10-18 ENCOUNTER — Other Ambulatory Visit: Payer: Self-pay | Admitting: Internal Medicine

## 2018-10-18 ENCOUNTER — Other Ambulatory Visit (HOSPITAL_COMMUNITY): Payer: Self-pay | Admitting: Internal Medicine

## 2018-10-18 DIAGNOSIS — K567 Ileus, unspecified: Secondary | ICD-10-CM

## 2018-10-21 ENCOUNTER — Emergency Department (HOSPITAL_COMMUNITY): Payer: Medicare Other

## 2018-10-21 ENCOUNTER — Emergency Department (HOSPITAL_COMMUNITY)
Admission: EM | Admit: 2018-10-21 | Discharge: 2018-10-21 | Disposition: A | Discharge status: Home / Self Care | Payer: Medicare Other | Attending: Emergency Medicine | Admitting: Emergency Medicine

## 2018-10-21 ENCOUNTER — Other Ambulatory Visit: Payer: Self-pay

## 2018-10-21 DIAGNOSIS — I5032 Chronic diastolic (congestive) heart failure: Secondary | ICD-10-CM | POA: Diagnosis not present

## 2018-10-21 DIAGNOSIS — Z79899 Other long term (current) drug therapy: Secondary | ICD-10-CM | POA: Insufficient documentation

## 2018-10-21 DIAGNOSIS — Z950 Presence of cardiac pacemaker: Secondary | ICD-10-CM | POA: Diagnosis not present

## 2018-10-21 DIAGNOSIS — K9429 Other complications of gastrostomy: Secondary | ICD-10-CM | POA: Diagnosis not present

## 2018-10-21 DIAGNOSIS — G8250 Quadriplegia, unspecified: Secondary | ICD-10-CM | POA: Diagnosis not present

## 2018-10-21 DIAGNOSIS — Z87891 Personal history of nicotine dependence: Secondary | ICD-10-CM | POA: Diagnosis not present

## 2018-10-21 DIAGNOSIS — J449 Chronic obstructive pulmonary disease, unspecified: Secondary | ICD-10-CM | POA: Diagnosis not present

## 2018-10-21 DIAGNOSIS — Z931 Gastrostomy status: Secondary | ICD-10-CM

## 2018-10-21 DIAGNOSIS — J9611 Chronic respiratory failure with hypoxia: Secondary | ICD-10-CM | POA: Insufficient documentation

## 2018-10-21 LAB — CBG MONITORING, ED: Glucose-Capillary: 214 mg/dL — ABNORMAL HIGH (ref 70–99)

## 2018-10-21 MED ORDER — GABAPENTIN 300 MG PO CAPS
300.0000 mg | ORAL_CAPSULE | Freq: Three times a day (TID) | ORAL | Status: DC
  Administered 2018-10-21 (×2): 300 mg via ORAL
  Filled 2018-10-21 (×2): qty 1

## 2018-10-21 MED ORDER — IOHEXOL 300 MG/ML  SOLN
50.0000 mL | Freq: Once | INTRAMUSCULAR | Status: AC | PRN
  Administered 2018-10-21: 15:00:00 40 mL via ORAL

## 2018-10-21 MED ORDER — IOHEXOL 300 MG/ML  SOLN: 30.0000 mL | Freq: Once | INTRAMUSCULAR | Status: DC | PRN

## 2018-10-21 NOTE — ED Triage Notes (Signed)
Pt BIBA from [place]-  Per EMS- G-tube became dislodged this AM, redness at insertion site.  VSS.  Pt denies any other complaints at this time.

## 2018-10-21 NOTE — ED Notes (Signed)
Patient g tube dressing saturated. G tube still in place with no evidence of complications. Dressing replaced. Linen and gown changed as well. Will continue to monitor patient.

## 2018-10-21 NOTE — ED Provider Notes (Signed)
[facility] DEPT Provider Note   CSN: [id number] Arrival date & time: 10/21/18  1226     History   Chief Complaint No chief complaint on file.   HPI Angela Page is a 48 y.o. female.     48 year old female here after her G-tube fell out this morning.  She is unsure of how it happened.  Denies any fever or chills.  No other complaints at this time.  Her home health aide put a 61 French Foley in as a temporizing measure.     Past Medical History:  Diagnosis Date  . Acute on chronic respiratory failure with hypoxia ([place])   . Anasarca 06/10/2016  . Asthma   . At high risk for severe sepsis   . Cocaine use 10/08/2013  . Decubitus ulcer of buttock, unstageable ([place])   . Depression   . Diabetes mellitus without complication ([place])   . GERD (gastroesophageal reflux disease)   . HCAP (healthcare-associated pneumonia) 06/09/2016  . Hepatitis    hx of hepatits frm mono   . Kidney stone   . Lobar pneumonia ([place])   . Overdose of opiate or related narcotic ([place]) 09/02/2014  . Pacemaker   . Pleurisy   . Polysubstance dependence including opioid type drug, episodic abuse ([place]) 10/27/2013  . Protein calorie malnutrition ([place]) 10/31/2015  . Quadriparesis ([place])   . Quadriplegia and quadriparesis ([place])   . Quadriplegia, C5-C7 incomplete ([place]) 10/08/2013  . Stage IV pressure ulcer of sacral region ([place]) 10/31/2015  . Tracheostomy status [facility])     Patient Active Problem List   Diagnosis Date Noted  . SBO (small bowel obstruction) ([place]) 08/10/2018  . Rectus sheath hematoma, initial encounter   . Acute encephalopathy 07/20/2018  . Hypercalcemia 06/23/2018  . Thrombocytosis ([place]) 06/22/2018  . Chronic respiratory failure with hypoxia ([place])   . Acute on chronic respiratory failure ([place])   . Quadriplegia and quadriparesis ()   . At high risk for severe sepsis   . Decubitus ulcer of buttock, unstageable ([place])   . Lactic acid acidosis 12/16/2017   . Suprapubic catheter ([place]) 12/16/2017  . Pressure injury of skin 07/17/2017  . Kidney stone 07/16/2017  . Right ureteral stone 03/10/2017  . Complicated UTI (urinary tract infection) 03/09/2017  . Ineffective airway clearance   . Sepsis secondary to UTI ([place]) 06/30/2016  . Autonomic neuropathy 06/10/2016  . Presence of permanent cardiac pacemaker 06/10/2016  . 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 Chronically on opiate therapy 06/10/2016  . PEG (percutaneous endoscopic gastrostomy) status ([place]) 06/10/2016  . Pulmonary hypertension ([place]) 06/10/2016  . Hypoalbuminemia 06/10/2016  . Peripheral edema   . Tracheostomy dependence ([place])   . Chronic obstructive pulmonary disease ([place])   . Chronic diastolic CHF (congestive heart failure) ([place]) 01/23/2016  . Tracheostomy status ([place])   . Esophageal dysphagia   . Malnutrition of moderate degree 11/01/2015  . Tobacco abuse 10/31/2015  . Asthma 10/31/2015  . History of pneumonia 10/08/2015  . Pressure ulcer of contiguous region involving buttock and hip, stage 4 ([place]) 02/26/2015  . Symptomatic anemia 02/25/2015  . Altered mental status 10/29/2014  . Acute metabolic encephalopathy [id number]  . Leukocytosis 10/23/2014  . Hypokalemia 10/23/2014  . Decubitus ulcer of sacral region, stage 4 ([place]) 10/23/2014  . Anemia, iron deficiency 10/23/2014  . Quadriplegia ([place]) 10/22/2014  . Depression   . Protein-calorie malnutrition, severe ([place]) 09/05/2014  . 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 Recurrent UTI 03/31/2014  . Pressure  ulcer of coccygeal region, stage 4 ([place]) 01/06/2014  . Stage 4 skin ulcer of sacral region ([place]) 01/06/2014  . Neuropathic pain 11/30/2013  . Paraplegia following spinal cord injury ([place]) 11/30/2013  . PEG (percutaneous endoscopic gastrostomy) adjustment/replacement/removal ([place]) 11/30/2013  . Spinal cord injury, cervical region ([place]) 11/30/2013  . Chronic pain due to injury 11/30/2013   . S/P cervical spinal fusion 10/27/2013  . Tracheostomy care ([place]) 10/27/2013  . 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 SCI (spinal cord injury) 10/11/2013    Past Surgical History:  Procedure Laterality Date  . APPENDECTOMY    . BACK SURGERY  10/08/2013   fusion of spine, cervical region  . CARDIAC SURGERY    . CYSTOSCOPY W/ URETERAL STENT PLACEMENT Right 03/09/2017   Procedure: CYSTOSCOPY [name] STENT PLACEMENT;  Surgeon: Irine Seal, MD;  Location: [facility];  Service: Urology;  Laterality: Right;  . CYSTOSCOPY W/ URETERAL STENT REMOVAL Right 07/16/2017   Procedure: CYSTOSCOPY WITH STENT REMOVAL;  Surgeon: Franchot Gallo, MD;  Location: [facility];  Service: Urology;  Laterality: Right;  . CYSTOSCOPY/URETEROSCOPY/HOLMIUM LASER/STENT PLACEMENT Right 07/16/2017   Procedure: CYSTOSCOPY/URETEROSCOPY/HOLMIUM LASER/STENT PLACEMENT/ALSO STONE EXTRACTION;  Surgeon: Franchot Gallo, MD;  Location: [facility];  Service: Urology;  Laterality: Right;  . GASTROSTOMY  11/23/2015  . I&D EXTREMITY  10/28/2011   Procedure: IRRIGATION AND DEBRIDEMENT EXTREMITY;  Surgeon: Tennis Must, MD;  Location: [facility];  Service: Orthopedics;  Laterality: Right;  Irrigation and debridement right middle finger  . INSERTION OF SUPRAPUBIC CATHETER N/A 07/28/2016   Procedure: INSERTION OF SUPRAPUBIC CATHETER;  Surgeon: Franchot Gallo, MD;  Location: [facility];  Service: Urology;  Laterality: N/A;  . IR GENERIC HISTORICAL  11/23/2015   IR GASTROSTOMY TUBE MOD SED 11/23/2015 [facility]-INTER[facility]  . IR PATIENT EVAL TECH 0-60 MINS  12/18/2017  . IR REPLACE G-TUBE SIMPLE WO FLUORO  12/19/2017  . LITHOTRIPSY  08/2017  . TRACHEOSTOMY       OB History   No obstetric history on file.      Home Medications    Prior to Admission medications   Medication Sig Start Date End Date Taking? Authorizing Provider  acetaminophen (TYLENOL) 500 MG tablet Take 1 tablet (500 mg total) by mouth every 8 (eight) hours as  needed for mild pain. 08/24/18   Hosie Poisson, MD  Amino Acids-Protein Hydrolys (FEEDING SUPPLEMENT, PRO-STAT SUGAR FREE 64,) LIQD Place 30 mLs into feeding tube 3 (three) times daily. 08/23/18   Hosie Poisson, MD  baclofen (LIORESAL) 5 mg TABS tablet Take 5 mg by mouth 3 (three) times daily.    [provider]  bisacodyl (DULCOLAX) 10 MG suppository Place 1 suppository (10 mg total) rectally daily as needed for mild constipation. 08/23/18   Hosie Poisson, MD  buPROPion (WELLBUTRIN SR) 150 MG 12 hr tablet Take 150 mg by mouth daily.    [provider]  docusate (COLACE) 50 MG/5ML liquid Place 10 mLs (100 mg total) into feeding tube daily. 08/24/18   Hosie Poisson, MD  Dulaglutide (TRULICITY) 1.5 0000000 SOPN Inject 1.5 mg into the skin every Friday.    [provider]  DULoxetine (CYMBALTA) 30 MG capsule Take 1 capsule (30 mg total) by mouth 2 (two) times daily. 02/17/18   Danford, Suann Larry, MD  ferrous sulfate 300 (60 Fe) MG/5ML syrup Place 3.7 mLs (220 mg total) into feeding tube daily with breakfast. Patient not taking: Reported on 08/10/2018 04/11/18   Kayleen Memos, DO  gabapentin (NEURONTIN) 300 MG  capsule Take 300 mg by mouth 3 (three) times daily.    [provider]  insulin aspart (NOVOLOG) 100 UNIT/ML injection Inject 10 Units into the skin every 4 (four) hours. For glucose 150 to 200 use 1 units, for glucose 201-250 use 2 units, for 251 to 300 use 4 units, for 301 to 350 use 6 units, for 351 to 400 use 8 units, for 401 or greater use 10 units. Patient taking differently: Inject 0-10 Units into the skin See admin instructions. Check FSBS every 6 hours. Give insulin according to sliding scale:  0-150 0 units 151-200 1 units 201-250 2 units 251-300 4 units 301-350 6 units 351-400 8 units >400 call MD 12/31/17   Arrien, Jimmy Picket, MD  Insulin Glargine (BASAGLAR KWIKPEN) 100 UNIT/ML SOPN Inject 0.05 mLs (5 Units total) into the skin at bedtime. 04/11/18    Kayleen Memos, DO  lip balm (CARMEX) ointment Apply topically as needed for lip care. 06/27/18   Georgette Shell, MD  loratadine (CLARITIN) 10 MG tablet Take 10 mg by mouth daily.     [provider]  metoCLOPramide (REGLAN) 5 MG tablet Place 5 mg into feeding tube 4 (four) times daily.     [provider]  midodrine (PROAMATINE) 10 MG tablet Take 10 mg by mouth 3 (three) times daily as needed (hypotension).     [provider]  Nutritional Supplements (FEEDING SUPPLEMENT, GLUCERNA 1.2 CAL,) LIQD Place 1,000 mLs into feeding tube daily. Patient taking differently: Place 1,000 mLs into feeding tube See admin instructions. On at 1400 and off at 1000 52mL/hr 02/18/18   Danford, Suann Larry, MD  Nutritional Supplements (FEEDING SUPPLEMENT, GLUCERNA 1.2 CAL,) LIQD Place 400 mLs into feeding tube daily. 08/24/18   Hosie Poisson, MD  Nutritional Supplements (PROMOD) LIQD Take 30 mLs by mouth 3 (three) times daily.    [provider]  oxycodone (OXY-IR) 5 MG capsule Take 1 capsule (5 mg total) by mouth every 8 (eight) hours as needed for pain. 08/24/18   Hosie Poisson, MD  OXYGEN Inhale 3 L into the lungs continuous.    [provider]  pantoprazole (PROTONIX) 40 MG tablet Take 40 mg by mouth 2 (two) times daily.    [provider]  polyethylene glycol (MIRALAX / GLYCOLAX) packet Take 17 g by mouth daily. Hold if diarrhea. 01/01/18   Arrien, Jimmy Picket, MD  tiotropium (SPIRIVA) 18 MCG inhalation capsule Place 18 mcg into inhaler and inhale daily.    [provider]  zolpidem (AMBIEN) 5 MG tablet Take 0.5 tablets (2.5 mg total) by mouth at bedtime. 08/24/18   Hosie Poisson, MD    Family History Family History  Problem Relation Age of Onset  . Hypertension Maternal Uncle   . Kidney failure Maternal Uncle   . Colon cancer Mother   . Lung cancer Father   . Hypertension Brother   . Kidney failure Maternal Aunt   . Kidney failure  Maternal Uncle   . Colon cancer Maternal Grandfather   . Esophageal cancer Neg Hx     Social History Social History   Tobacco Use  . Smoking status: Former Smoker    Packs/day: 1.00    Years: 25.00    Pack years: 25.00    Types: Cigarettes  . Smokeless tobacco: Never Used  Substance Use Topics  . Alcohol use: Never    Frequency: Never  . Drug use: No     Allergies   Penicillins, Erythromycin, Nitrofurantoin  monohyd macro, and Oxybutynin   Review of Systems Review of Systems  All other systems reviewed and are negative.    Physical Exam Updated Vital Signs BP 102/82   Pulse 93   Resp 19   LMP 05/27/2016 (Within Days) Comment: Irregular periods since October 2015.  SpO2 100%   Physical Exam Vitals signs and nursing note reviewed.  Constitutional:      General: She is not in acute distress.    Appearance: Normal appearance. She is well-developed. She is not toxic-appearing.  HENT:     Head: Normocephalic and atraumatic.  Eyes:     General: Lids are normal.     Conjunctiva/sclera: Conjunctivae normal.     Pupils: Pupils are equal, round, and reactive to light.  Neck:     Musculoskeletal: Normal range of motion and neck supple.     Thyroid: No thyroid mass.     Trachea: No tracheal deviation.  Cardiovascular:     Rate and Rhythm: Normal rate and regular rhythm.     Heart sounds: Normal heart sounds. No murmur. No gallop.   Pulmonary:     Effort: Pulmonary effort is normal. No respiratory distress.     Breath sounds: Normal breath sounds. No stridor. No decreased breath sounds, wheezing, rhonchi or rales.  Abdominal:     General: Bowel sounds are normal. There is no distension.     Palpations: Abdomen is soft.     Tenderness: There is no abdominal tenderness. There is no rebound.    Musculoskeletal: Normal range of motion.        General: No tenderness.  Skin:    General: Skin is warm and dry.     Findings: No abrasion or rash.  Neurological:      Mental Status: She is alert and oriented to person, place, and time. Mental status is at baseline.     GCS: GCS eye subscore is 4. GCS verbal subscore is 5. GCS motor subscore is 6.  Psychiatric:        Attention and Perception: Attention normal.        Speech: Speech normal.        Behavior: Behavior normal.      ED Treatments / Results  Labs (all labs ordered are listed, but only abnormal results are displayed) Labs Reviewed  CBG MONITORING, ED - Abnormal; Notable for the following components:      Result Value   Glucose-Capillary 214 (*)    All other components within normal limits    EKG None  Radiology No results found.  Procedures Gastrostomy tube replacement  Date/Time: 10/21/2018 8:47 PM Performed by: Lacretia Leigh, MD Authorized by: Lacretia Leigh, MD  Consent: Verbal consent obtained. Written consent not obtained. Risks and benefits: risks, benefits and alternatives were discussed Consent given by: patient Required items: required blood products, implants, devices, and special equipment available Patient identity confirmed: verbally with patient Time out: Immediately prior to procedure a "time out" was called to verify the correct patient, procedure, equipment, support staff and site/side marked as required. Preparation: Patient was prepped and draped in the usual sterile fashion. Local anesthesia used: no  Anesthesia: Local anesthesia used: no  Sedation: Patient sedated: no     (including critical care time)  Medications Ordered in ED Medications - No data to display   Initial Impression / Assessment and Plan / ED Course  I have reviewed the triage vital signs and the nursing notes.  Pertinent labs & imaging results that were  available during my care of the patient were reviewed by me and considered in my medical decision making (see chart for details).       g-tube placement performed pts pcp requested abd ct and that negative today abd ct  w/ questionable sacroiliitis and patient states she will follow-up in her wound care center for this as she does have a history of decubitus ulcers.  She denies any recent fever or chills.  States that she is been in her baseline state of health.  Final Clinical Impressions(s) / ED Diagnoses   Final diagnoses:  None    ED Discharge Orders    None       Lacretia Leigh, MD 10/21/18 2050

## 2018-10-21 NOTE — ED Notes (Signed)
PTAR contacted for transported back to [facility]. Paperwork printed and at the nursing station.

## 2018-10-21 NOTE — ED Notes (Signed)
Pt reports G-tube became displaced this AM, unsure exactly how.  She reports "feels like pressure, I don't have much feeling down there to begin with."  Denies pain, no other complaints at this time.

## 2018-10-21 NOTE — ED Notes (Signed)
Pts PCP, Drake Leach, NP [phone]) at [facility], given update as to pts current tx in [facility].

## 2018-10-21 NOTE — ED Notes (Signed)
New G-tube cleaned with sterile saline, dressed with split gauze.

## 2018-10-21 NOTE — ED Notes (Signed)
PTAR at bedside to transport patient to [facility]. Patient stable and in NAD at time of discharge. Report given to Darrold Span at facility. Patient unable to sign due to contractures of upper arms.

## 2018-10-21 NOTE — Discharge Instructions (Addendum)
Your CAT scan today showed a possible infection of your sacroiliac joint.  Follow-up with your wound care doctor for this.  Return here if you develop fevers or any other symptoms.

## 2018-10-25 ENCOUNTER — Ambulatory Visit (HOSPITAL_COMMUNITY): Payer: Medicare Other

## 2018-10-25 ENCOUNTER — Encounter (HOSPITAL_COMMUNITY): Payer: Self-pay

## 2018-10-25 IMAGING — US US ABDOMEN COMPLETE
1 series · 14 of 25 positions shown · non-contrast
Comparison: 11/12/2015

CLINICAL DATA: Abnormal transaminases.  Anasarca

EXAM:
ABDOMEN ULTRASOUND COMPLETE

[Series 1: us abdomen complete · 0.23mm/px · 14 of 74 slices shown]
[im 1/74]
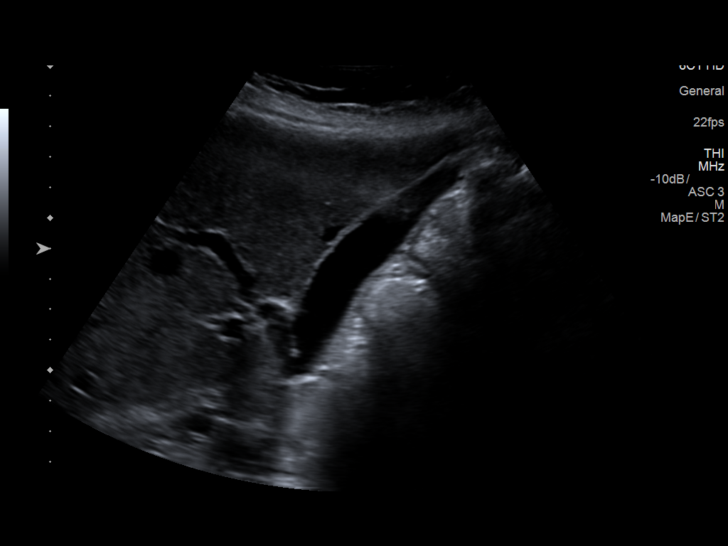
[im 7/74]
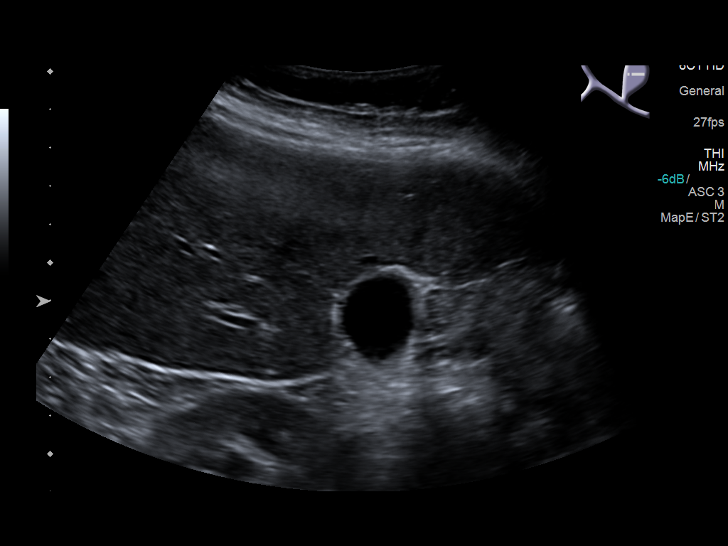
[im 13/74]
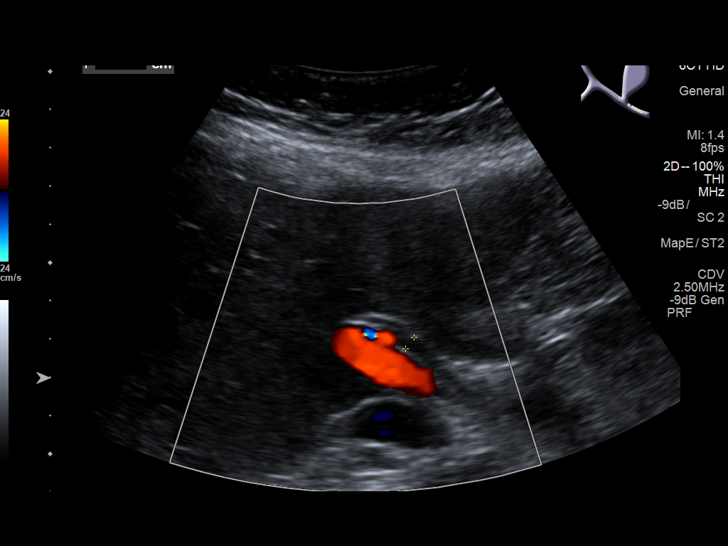
[im 19/74]
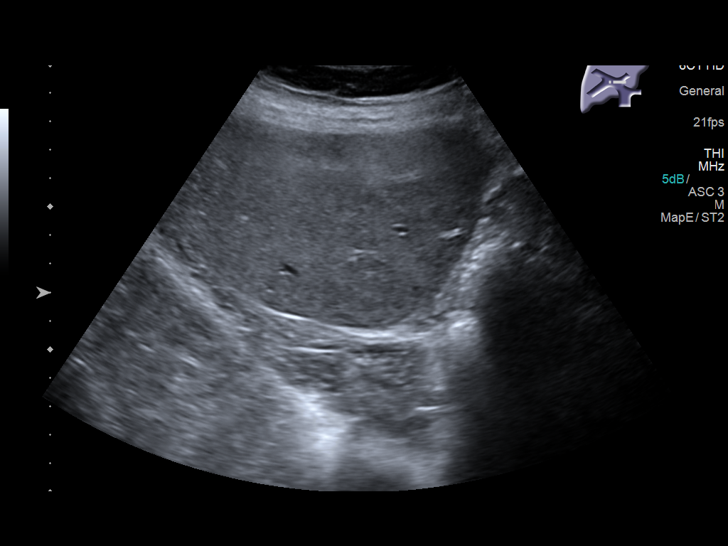
[im 25/74]
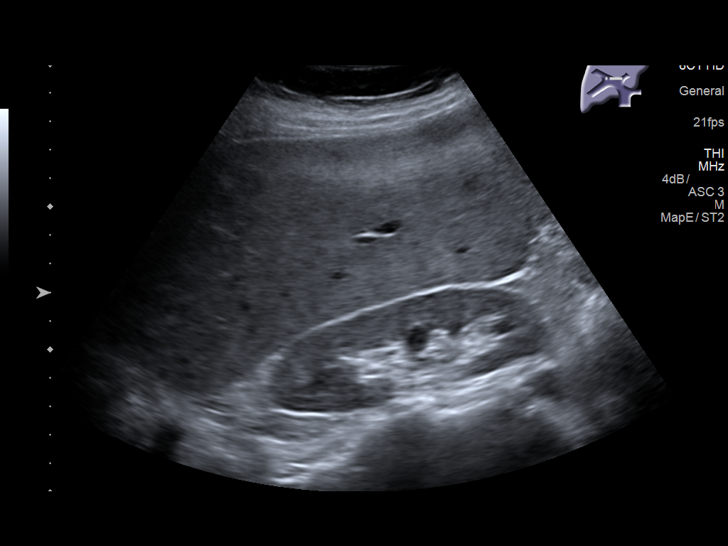
[im 28/74]
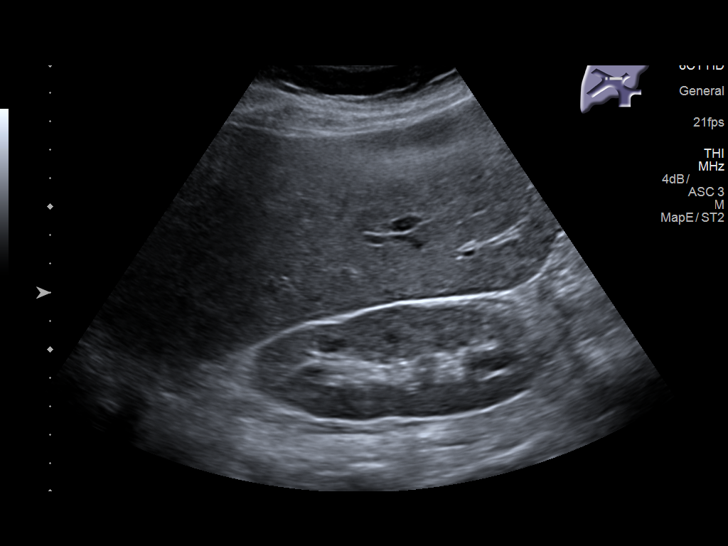
[im 34/74]
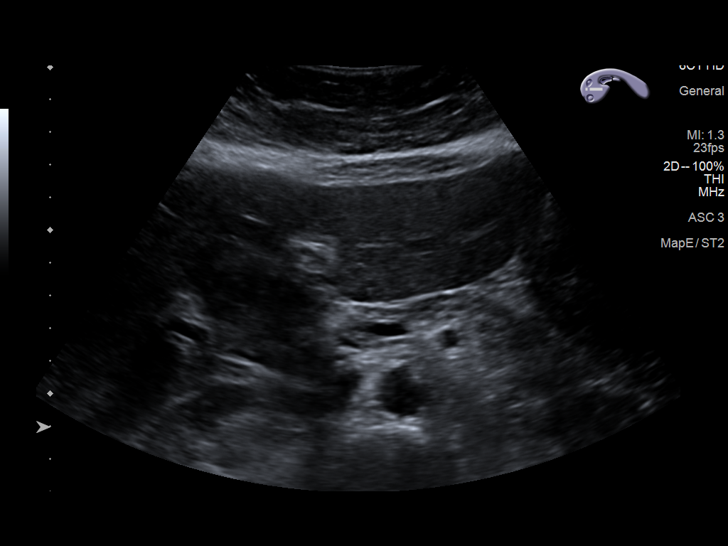
[im 40/74]
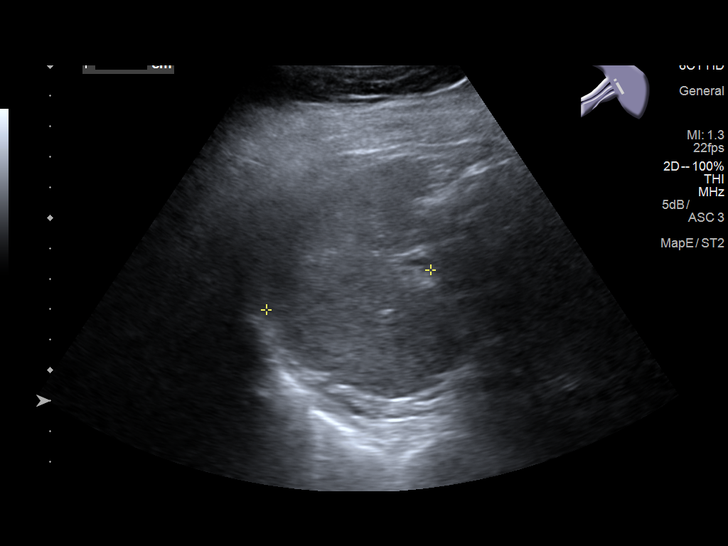
[im 46/74]
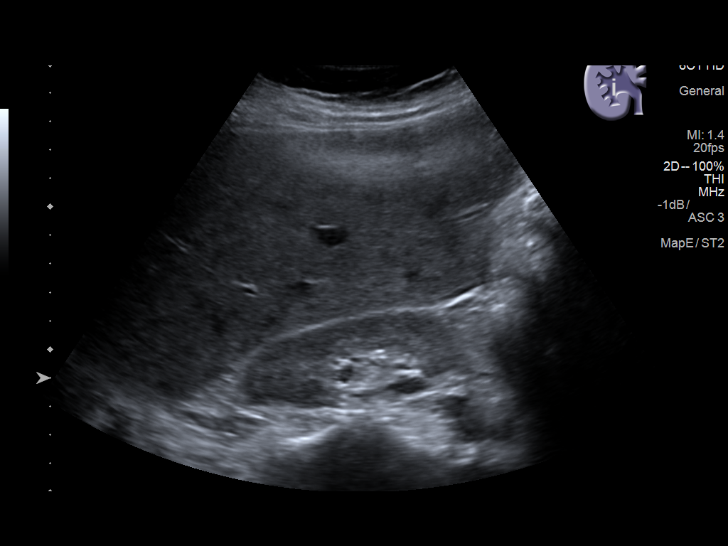
[im 49/74]
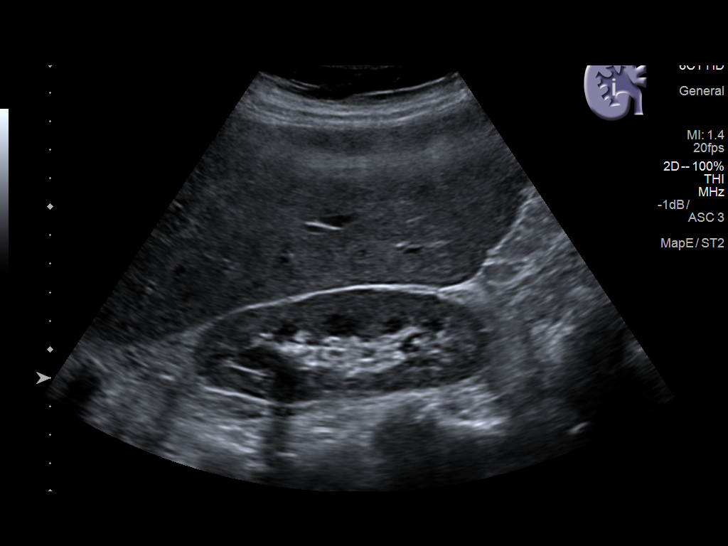
[im 55/74]
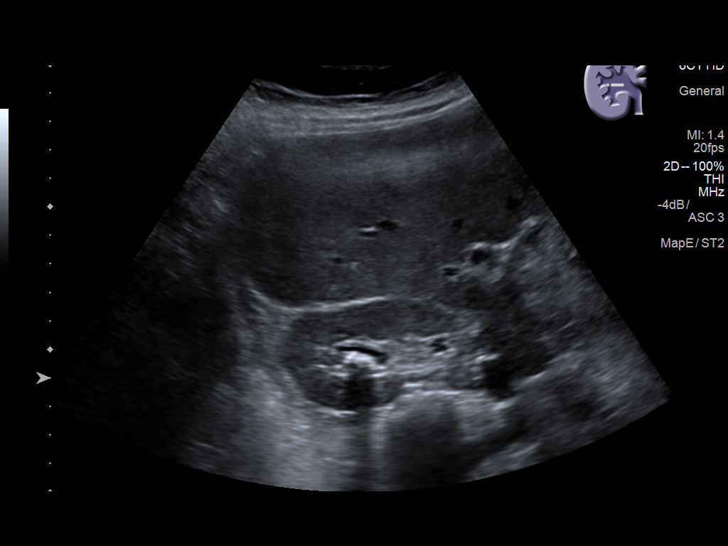
[im 61/74]
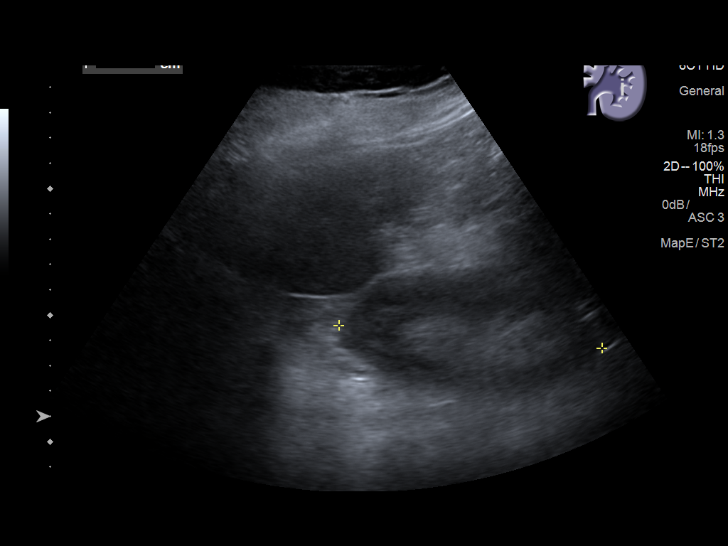
[im 67/74]
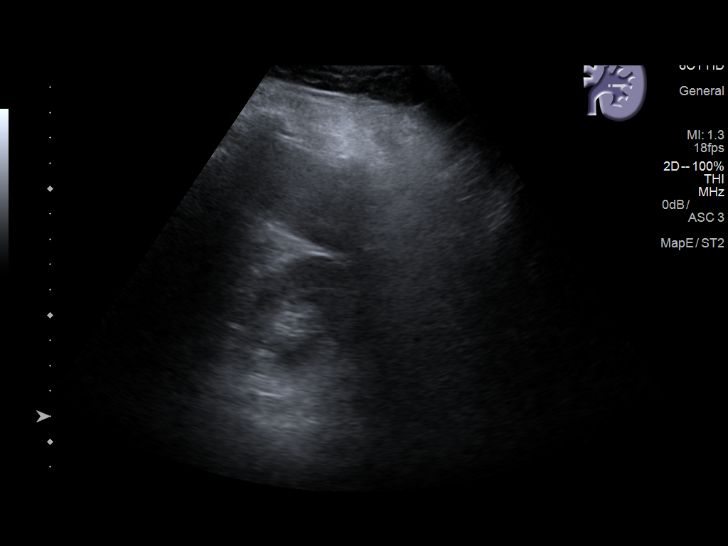
[im 74/74]
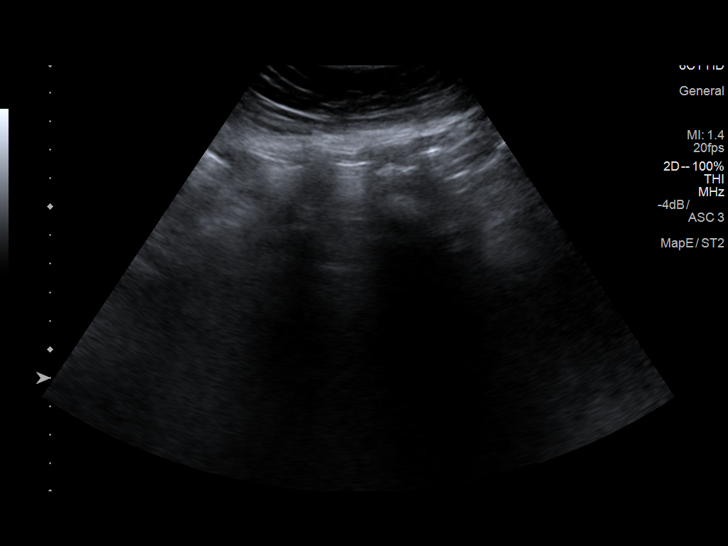

[14 of 25 positions shown; findings below may reference images not displayed]

FINDINGS: Gallbladder: No gallstones or wall thickening visualized. No
sonographic Murphy sign noted by sonographer.

Common bile duct: Diameter: 4 mm. Where visualized, no filling
defect.

Liver: No focal lesion identified. Within normal limits in
parenchymal echogenicity. Antegrade flow in the main portal vein.

IVC: No abnormality visualized.

Pancreas: Visualized portion unremarkable.

Spleen: Size and appearance within normal limits.

Right Kidney: Length: 10.7 cm. Echogenicity within normal limits. 9
mm shadowing calculus over the upper pole. No mass or hydronephrosis
visualized.

Left Kidney: Length: 10 cm. Echogenicity within normal limits. No
mass or hydronephrosis visualized.

Abdominal aorta: No aneurysm or other abnormality visualized. The
distal aorta is obscured by bowel gas.

Other findings: Right basilar consolidation as seen on chest CT
yesterday.
IMPRESSION: 1. Normal appearance of the liver and biliary tree.
2. 9 mm right renal calculus.

## 2018-11-21 IMAGING — CR DG ABDOMEN ACUTE W/ 1V CHEST
3 series · 3 of 3 positions shown · non-contrast
Comparison: 07/28/2016

CLINICAL DATA: Productive cough and abdominal pain

EXAM:
DG ABDOMEN ACUTE W/ 1V CHEST

[x chest ap]
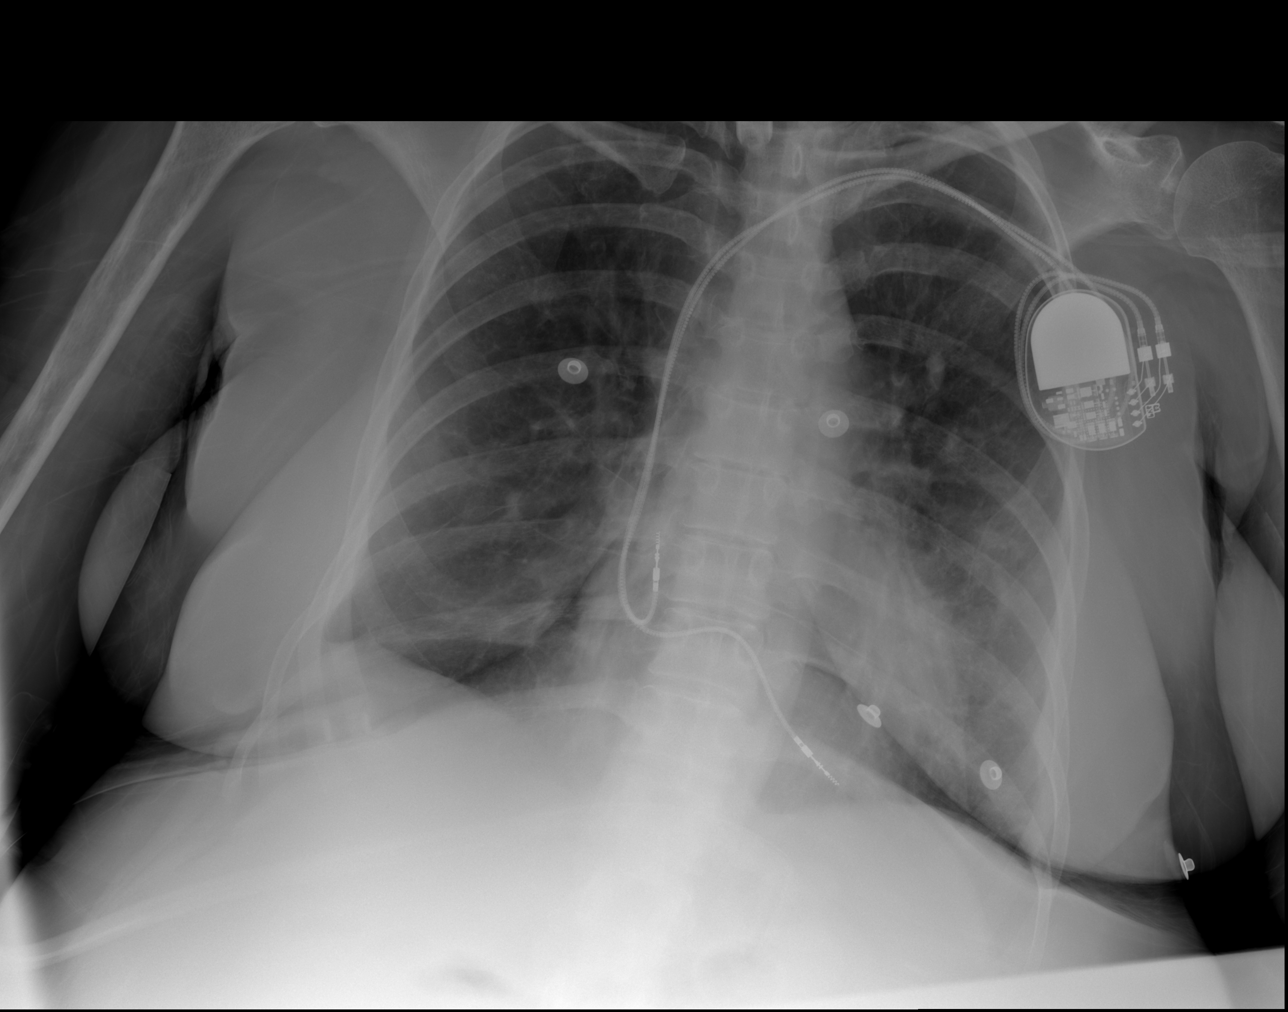

[x abdomen supine]
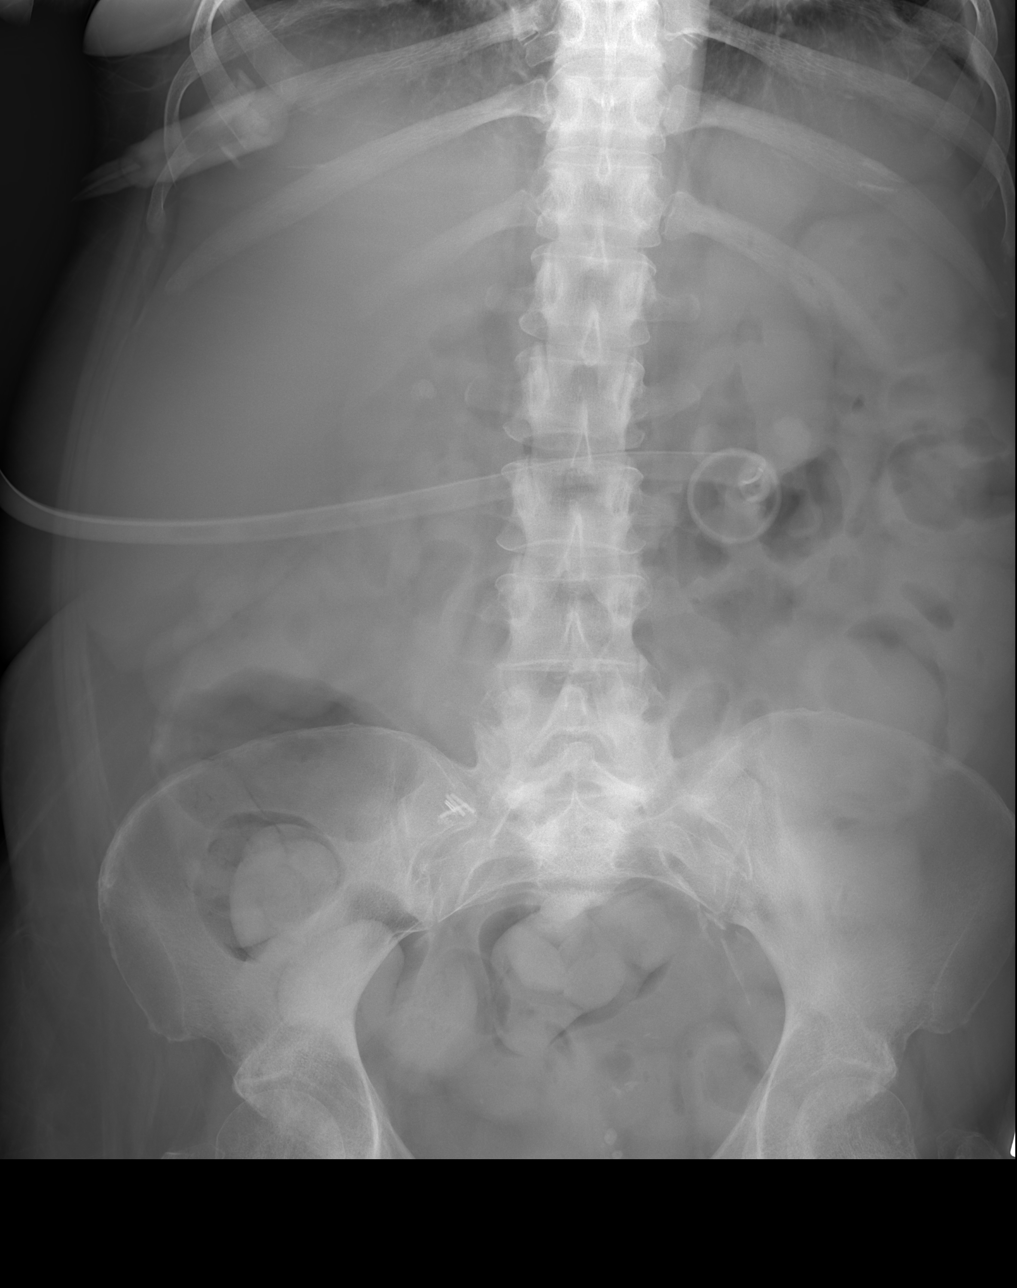

[w abdomen decub]
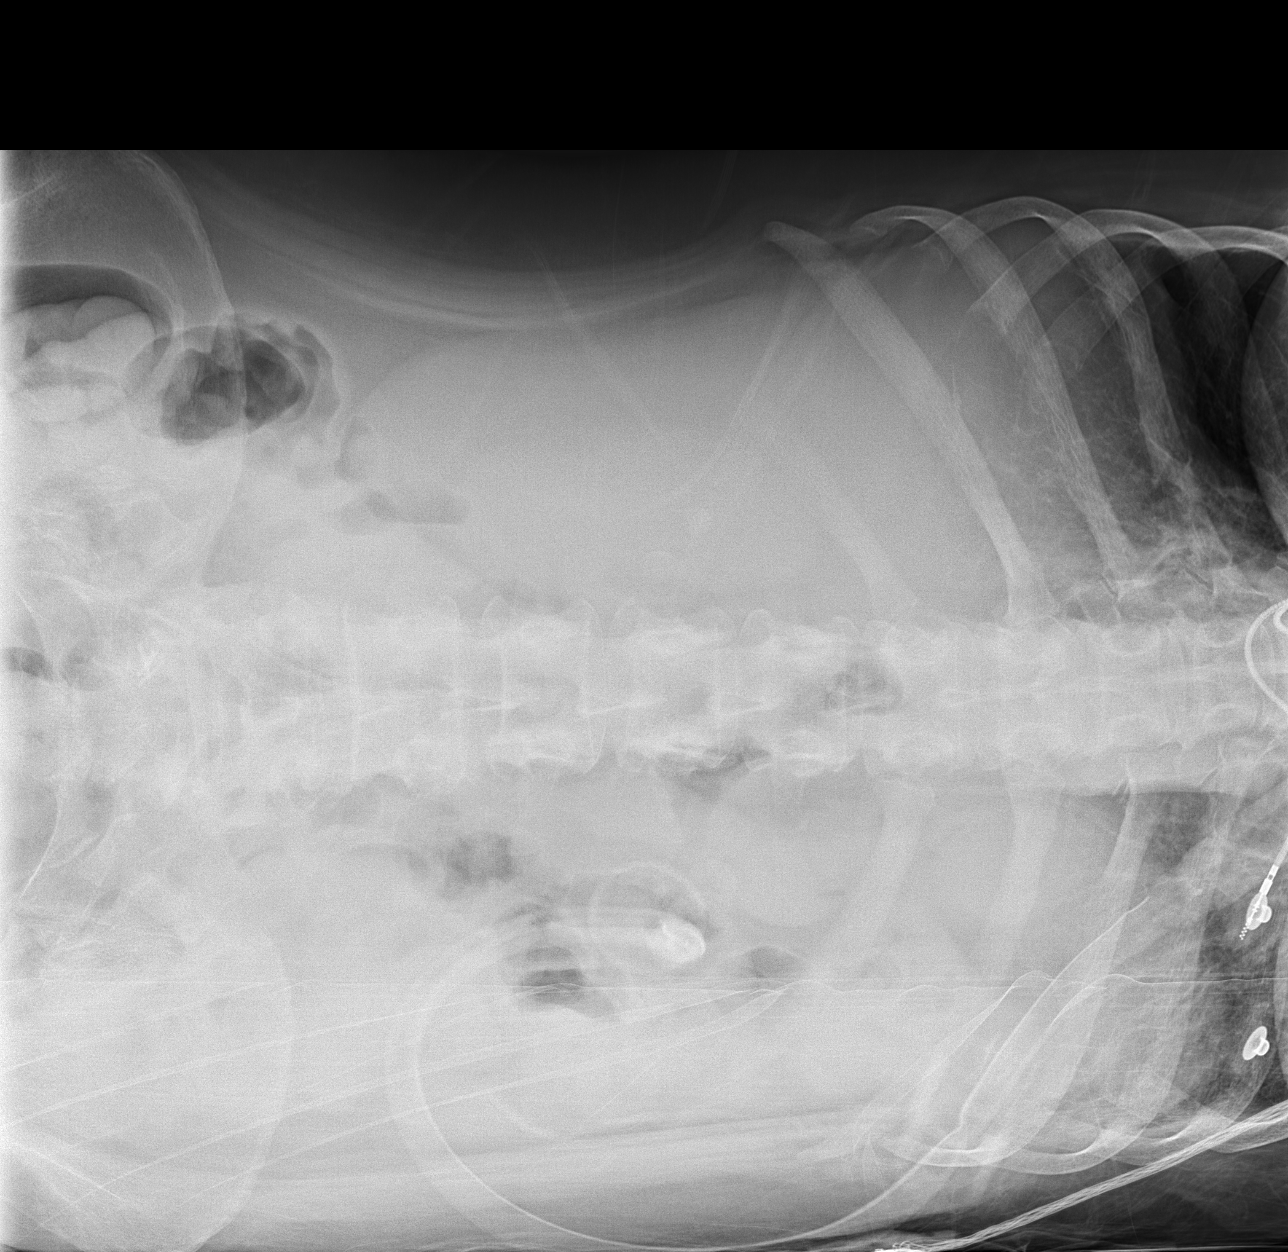

[3 of 3 positions shown; findings below may reference images not displayed]

FINDINGS: Cardiac shadow is stable. Pacing device is again seen. Tracheostomy
tube is noted. Mild interstitial changes are noted in the bases
although stable from the prior exam.

Scattered large and small bowel gas is noted. Gastrostomy catheter
is noted in place. No obstructive changes are seen. No bony
abnormality is noted. Calcifications noted to the right of the
midline consistent with nonobstructing renal stone. No free air is
seen.
IMPRESSION: Likely right renal calculus.

Chronic changes in chest without acute abnormality.

## 2019-01-12 ENCOUNTER — Other Ambulatory Visit: Payer: Self-pay

## 2019-01-12 ENCOUNTER — Inpatient Hospital Stay (HOSPITAL_COMMUNITY)
Admission: EM | Admit: 2019-01-12 | Discharge: 2019-02-21 | DRG: 698 | Disposition: E | Payer: Medicare Other | Source: Skilled Nursing Facility | Attending: Internal Medicine | Admitting: Internal Medicine

## 2019-01-12 ENCOUNTER — Encounter (HOSPITAL_COMMUNITY): Payer: Self-pay | Admitting: Family Medicine

## 2019-01-12 ENCOUNTER — Inpatient Hospital Stay (HOSPITAL_COMMUNITY): Payer: Medicare Other

## 2019-01-12 ENCOUNTER — Emergency Department (HOSPITAL_COMMUNITY): Payer: Medicare Other

## 2019-01-12 DIAGNOSIS — T83518A Infection and inflammatory reaction due to other urinary catheter, initial encounter: Secondary | ICD-10-CM | POA: Diagnosis present

## 2019-01-12 DIAGNOSIS — I2510 Atherosclerotic heart disease of native coronary artery without angina pectoris: Secondary | ICD-10-CM | POA: Diagnosis present

## 2019-01-12 DIAGNOSIS — U071 COVID-19: Secondary | ICD-10-CM | POA: Diagnosis present

## 2019-01-12 DIAGNOSIS — Z9289 Personal history of other medical treatment: Secondary | ICD-10-CM

## 2019-01-12 DIAGNOSIS — A419 Sepsis, unspecified organism: Secondary | ICD-10-CM | POA: Diagnosis present

## 2019-01-12 DIAGNOSIS — Z930 Tracheostomy status: Secondary | ICD-10-CM | POA: Diagnosis not present

## 2019-01-12 DIAGNOSIS — Z515 Encounter for palliative care: Secondary | ICD-10-CM

## 2019-01-12 DIAGNOSIS — Z681 Body mass index (BMI) 19 or less, adult: Secondary | ICD-10-CM | POA: Diagnosis not present

## 2019-01-12 DIAGNOSIS — N319 Neuromuscular dysfunction of bladder, unspecified: Secondary | ICD-10-CM | POA: Diagnosis present

## 2019-01-12 DIAGNOSIS — E874 Mixed disorder of acid-base balance: Secondary | ICD-10-CM | POA: Diagnosis not present

## 2019-01-12 DIAGNOSIS — G8250 Quadriplegia, unspecified: Secondary | ICD-10-CM | POA: Diagnosis not present

## 2019-01-12 DIAGNOSIS — G9341 Metabolic encephalopathy: Secondary | ICD-10-CM | POA: Diagnosis not present

## 2019-01-12 DIAGNOSIS — Z8249 Family history of ischemic heart disease and other diseases of the circulatory system: Secondary | ICD-10-CM

## 2019-01-12 DIAGNOSIS — L03312 Cellulitis of back [any part except buttock]: Secondary | ICD-10-CM | POA: Diagnosis present

## 2019-01-12 DIAGNOSIS — D509 Iron deficiency anemia, unspecified: Secondary | ICD-10-CM | POA: Diagnosis present

## 2019-01-12 DIAGNOSIS — Z7401 Bed confinement status: Secondary | ICD-10-CM

## 2019-01-12 DIAGNOSIS — E11649 Type 2 diabetes mellitus with hypoglycemia without coma: Secondary | ICD-10-CM | POA: Diagnosis present

## 2019-01-12 DIAGNOSIS — R0600 Dyspnea, unspecified: Secondary | ICD-10-CM | POA: Diagnosis not present

## 2019-01-12 DIAGNOSIS — J9622 Acute and chronic respiratory failure with hypercapnia: Secondary | ICD-10-CM | POA: Diagnosis present

## 2019-01-12 DIAGNOSIS — B9562 Methicillin resistant Staphylococcus aureus infection as the cause of diseases classified elsewhere: Secondary | ICD-10-CM | POA: Diagnosis not present

## 2019-01-12 DIAGNOSIS — G4700 Insomnia, unspecified: Secondary | ICD-10-CM | POA: Diagnosis present

## 2019-01-12 DIAGNOSIS — T508X5A Adverse effect of diagnostic agents, initial encounter: Secondary | ICD-10-CM | POA: Diagnosis not present

## 2019-01-12 DIAGNOSIS — Y92239 Unspecified place in hospital as the place of occurrence of the external cause: Secondary | ICD-10-CM | POA: Diagnosis not present

## 2019-01-12 DIAGNOSIS — I9589 Other hypotension: Secondary | ICD-10-CM | POA: Diagnosis present

## 2019-01-12 DIAGNOSIS — R579 Shock, unspecified: Secondary | ICD-10-CM | POA: Diagnosis not present

## 2019-01-12 DIAGNOSIS — E43 Unspecified severe protein-calorie malnutrition: Secondary | ICD-10-CM | POA: Diagnosis present

## 2019-01-12 DIAGNOSIS — E875 Hyperkalemia: Secondary | ICD-10-CM | POA: Diagnosis not present

## 2019-01-12 DIAGNOSIS — R6521 Severe sepsis with septic shock: Secondary | ICD-10-CM | POA: Diagnosis not present

## 2019-01-12 DIAGNOSIS — M86662 Other chronic osteomyelitis, left tibia and fibula: Secondary | ICD-10-CM | POA: Diagnosis present

## 2019-01-12 DIAGNOSIS — M8669 Other chronic osteomyelitis, multiple sites: Secondary | ICD-10-CM | POA: Diagnosis present

## 2019-01-12 DIAGNOSIS — Z9049 Acquired absence of other specified parts of digestive tract: Secondary | ICD-10-CM

## 2019-01-12 DIAGNOSIS — R7989 Other specified abnormal findings of blood chemistry: Secondary | ICD-10-CM | POA: Diagnosis not present

## 2019-01-12 DIAGNOSIS — G903 Multi-system degeneration of the autonomic nervous system: Secondary | ICD-10-CM | POA: Diagnosis present

## 2019-01-12 DIAGNOSIS — I700 Atherosclerosis of aorta: Secondary | ICD-10-CM | POA: Diagnosis present

## 2019-01-12 DIAGNOSIS — L89154 Pressure ulcer of sacral region, stage 4: Secondary | ICD-10-CM | POA: Diagnosis present

## 2019-01-12 DIAGNOSIS — M869 Osteomyelitis, unspecified: Secondary | ICD-10-CM

## 2019-01-12 DIAGNOSIS — J9601 Acute respiratory failure with hypoxia: Secondary | ICD-10-CM | POA: Diagnosis not present

## 2019-01-12 DIAGNOSIS — E1165 Type 2 diabetes mellitus with hyperglycemia: Secondary | ICD-10-CM | POA: Diagnosis not present

## 2019-01-12 DIAGNOSIS — G8254 Quadriplegia, C5-C7 incomplete: Secondary | ICD-10-CM | POA: Diagnosis present

## 2019-01-12 DIAGNOSIS — N179 Acute kidney failure, unspecified: Secondary | ICD-10-CM | POA: Diagnosis not present

## 2019-01-12 DIAGNOSIS — F419 Anxiety disorder, unspecified: Secondary | ICD-10-CM | POA: Diagnosis present

## 2019-01-12 DIAGNOSIS — J690 Pneumonitis due to inhalation of food and vomit: Secondary | ICD-10-CM | POA: Diagnosis not present

## 2019-01-12 DIAGNOSIS — L89899 Pressure ulcer of other site, unspecified stage: Secondary | ICD-10-CM | POA: Diagnosis present

## 2019-01-12 DIAGNOSIS — I495 Sick sinus syndrome: Secondary | ICD-10-CM | POA: Diagnosis present

## 2019-01-12 DIAGNOSIS — Y846 Urinary catheterization as the cause of abnormal reaction of the patient, or of later complication, without mention of misadventure at the time of the procedure: Secondary | ICD-10-CM | POA: Diagnosis present

## 2019-01-12 DIAGNOSIS — N390 Urinary tract infection, site not specified: Secondary | ICD-10-CM | POA: Diagnosis not present

## 2019-01-12 DIAGNOSIS — F329 Major depressive disorder, single episode, unspecified: Secondary | ICD-10-CM | POA: Diagnosis present

## 2019-01-12 DIAGNOSIS — R64 Cachexia: Secondary | ICD-10-CM | POA: Diagnosis present

## 2019-01-12 DIAGNOSIS — R0602 Shortness of breath: Secondary | ICD-10-CM

## 2019-01-12 DIAGNOSIS — Z880 Allergy status to penicillin: Secondary | ICD-10-CM

## 2019-01-12 DIAGNOSIS — Z66 Do not resuscitate: Secondary | ICD-10-CM | POA: Diagnosis not present

## 2019-01-12 DIAGNOSIS — Z888 Allergy status to other drugs, medicaments and biological substances status: Secondary | ICD-10-CM

## 2019-01-12 DIAGNOSIS — D6489 Other specified anemias: Secondary | ICD-10-CM | POA: Diagnosis present

## 2019-01-12 DIAGNOSIS — I509 Heart failure, unspecified: Secondary | ICD-10-CM | POA: Diagnosis not present

## 2019-01-12 DIAGNOSIS — J9690 Respiratory failure, unspecified, unspecified whether with hypoxia or hypercapnia: Secondary | ICD-10-CM

## 2019-01-12 DIAGNOSIS — J1282 Pneumonia due to coronavirus disease 2019: Secondary | ICD-10-CM | POA: Diagnosis present

## 2019-01-12 DIAGNOSIS — Z981 Arthrodesis status: Secondary | ICD-10-CM

## 2019-01-12 DIAGNOSIS — L89212 Pressure ulcer of right hip, stage 2: Secondary | ICD-10-CM | POA: Diagnosis present

## 2019-01-12 DIAGNOSIS — R748 Abnormal levels of other serum enzymes: Secondary | ICD-10-CM | POA: Diagnosis not present

## 2019-01-12 DIAGNOSIS — Z881 Allergy status to other antibiotic agents status: Secondary | ICD-10-CM

## 2019-01-12 DIAGNOSIS — Z800 Family history of malignant neoplasm of digestive organs: Secondary | ICD-10-CM

## 2019-01-12 DIAGNOSIS — B964 Proteus (mirabilis) (morganii) as the cause of diseases classified elsewhere: Secondary | ICD-10-CM | POA: Diagnosis present

## 2019-01-12 DIAGNOSIS — Z950 Presence of cardiac pacemaker: Secondary | ICD-10-CM

## 2019-01-12 DIAGNOSIS — L97929 Non-pressure chronic ulcer of unspecified part of left lower leg with unspecified severity: Secondary | ICD-10-CM | POA: Diagnosis present

## 2019-01-12 DIAGNOSIS — M8639 Chronic multifocal osteomyelitis, multiple sites: Secondary | ICD-10-CM | POA: Diagnosis not present

## 2019-01-12 DIAGNOSIS — Z931 Gastrostomy status: Secondary | ICD-10-CM

## 2019-01-12 DIAGNOSIS — J1289 Other viral pneumonia: Secondary | ICD-10-CM | POA: Diagnosis not present

## 2019-01-12 DIAGNOSIS — M86659 Other chronic osteomyelitis, unspecified thigh: Secondary | ICD-10-CM | POA: Diagnosis not present

## 2019-01-12 DIAGNOSIS — I5032 Chronic diastolic (congestive) heart failure: Secondary | ICD-10-CM | POA: Diagnosis present

## 2019-01-12 DIAGNOSIS — R0902 Hypoxemia: Secondary | ICD-10-CM

## 2019-01-12 DIAGNOSIS — E872 Acidosis: Secondary | ICD-10-CM | POA: Diagnosis not present

## 2019-01-12 DIAGNOSIS — R140 Abdominal distension (gaseous): Secondary | ICD-10-CM

## 2019-01-12 DIAGNOSIS — J440 Chronic obstructive pulmonary disease with acute lower respiratory infection: Secondary | ICD-10-CM | POA: Diagnosis present

## 2019-01-12 DIAGNOSIS — E876 Hypokalemia: Secondary | ICD-10-CM | POA: Diagnosis not present

## 2019-01-12 DIAGNOSIS — L98419 Non-pressure chronic ulcer of buttock with unspecified severity: Secondary | ICD-10-CM | POA: Diagnosis present

## 2019-01-12 DIAGNOSIS — Z7189 Other specified counseling: Secondary | ICD-10-CM | POA: Diagnosis not present

## 2019-01-12 DIAGNOSIS — Z79899 Other long term (current) drug therapy: Secondary | ICD-10-CM

## 2019-01-12 DIAGNOSIS — G8929 Other chronic pain: Secondary | ICD-10-CM | POA: Diagnosis present

## 2019-01-12 DIAGNOSIS — E870 Hyperosmolality and hypernatremia: Secondary | ICD-10-CM | POA: Diagnosis not present

## 2019-01-12 DIAGNOSIS — N926 Irregular menstruation, unspecified: Secondary | ICD-10-CM | POA: Diagnosis present

## 2019-01-12 DIAGNOSIS — Z79891 Long term (current) use of opiate analgesic: Secondary | ICD-10-CM

## 2019-01-12 DIAGNOSIS — J9621 Acute and chronic respiratory failure with hypoxia: Secondary | ICD-10-CM | POA: Diagnosis present

## 2019-01-12 DIAGNOSIS — Z9359 Other cystostomy status: Secondary | ICD-10-CM

## 2019-01-12 DIAGNOSIS — T368X5A Adverse effect of other systemic antibiotics, initial encounter: Secondary | ICD-10-CM | POA: Diagnosis not present

## 2019-01-12 DIAGNOSIS — D649 Anemia, unspecified: Secondary | ICD-10-CM | POA: Diagnosis not present

## 2019-01-12 DIAGNOSIS — Z801 Family history of malignant neoplasm of trachea, bronchus and lung: Secondary | ICD-10-CM

## 2019-01-12 DIAGNOSIS — J9600 Acute respiratory failure, unspecified whether with hypoxia or hypercapnia: Secondary | ICD-10-CM

## 2019-01-12 DIAGNOSIS — K219 Gastro-esophageal reflux disease without esophagitis: Secondary | ICD-10-CM | POA: Diagnosis present

## 2019-01-12 DIAGNOSIS — Z794 Long term (current) use of insulin: Secondary | ICD-10-CM

## 2019-01-12 DIAGNOSIS — Z87442 Personal history of urinary calculi: Secondary | ICD-10-CM

## 2019-01-12 DIAGNOSIS — N3289 Other specified disorders of bladder: Secondary | ICD-10-CM | POA: Diagnosis present

## 2019-01-12 DIAGNOSIS — Z9981 Dependence on supplemental oxygen: Secondary | ICD-10-CM

## 2019-01-12 DIAGNOSIS — R609 Edema, unspecified: Secondary | ICD-10-CM | POA: Diagnosis not present

## 2019-01-12 DIAGNOSIS — Z87891 Personal history of nicotine dependence: Secondary | ICD-10-CM

## 2019-01-12 DIAGNOSIS — J9620 Acute and chronic respiratory failure, unspecified whether with hypoxia or hypercapnia: Secondary | ICD-10-CM | POA: Diagnosis not present

## 2019-01-12 DIAGNOSIS — E1169 Type 2 diabetes mellitus with other specified complication: Secondary | ICD-10-CM | POA: Diagnosis present

## 2019-01-12 LAB — CBC WITH DIFFERENTIAL/PLATELET
Abs Immature Granulocytes: 0 K/uL (ref 0.00–0.07)
Basophils Absolute: 0.2 K/uL — ABNORMAL HIGH (ref 0.0–0.1)
Basophils Relative: 2 %
Eosinophils Absolute: 0.3 K/uL (ref 0.0–0.5)
Eosinophils Relative: 3 %
HCT: 25.5 % — ABNORMAL LOW (ref 36.0–46.0)
Hemoglobin: 7.1 g/dL — ABNORMAL LOW (ref 12.0–15.0)
Lymphocytes Relative: 22 %
Lymphs Abs: 2.2 K/uL (ref 0.7–4.0)
MCH: 23.7 pg — ABNORMAL LOW (ref 26.0–34.0)
MCHC: 27.8 g/dL — ABNORMAL LOW (ref 30.0–36.0)
MCV: 85 fL (ref 80.0–100.0)
Monocytes Absolute: 0.7 K/uL (ref 0.1–1.0)
Monocytes Relative: 7 %
Neutro Abs: 6.5 K/uL (ref 1.7–7.7)
Neutrophils Relative %: 66 %
Platelets: 678 K/uL — ABNORMAL HIGH (ref 150–400)
RBC: 3 MIL/uL — ABNORMAL LOW (ref 3.87–5.11)
RDW: 23.2 % — ABNORMAL HIGH (ref 11.5–15.5)
WBC: 9.8 K/uL (ref 4.0–10.5)
nRBC: 0 % (ref 0.0–0.2)
nRBC: 0 /100{WBCs}

## 2019-01-12 LAB — URINALYSIS, ROUTINE W REFLEX MICROSCOPIC
Glucose, UA: NEGATIVE mg/dL
Hgb urine dipstick: NEGATIVE
Ketones, ur: NEGATIVE mg/dL
Nitrite: POSITIVE — AB
Protein, ur: >=300 mg/dL — AB
RBC / HPF: >50 RBC/hpf — ABNORMAL HIGH (ref 0–5)
Specific Gravity, Urine: 1.02 (ref 1.005–1.030)
pH: 9 — ABNORMAL HIGH (ref 5.0–8.0)

## 2019-01-12 LAB — COMPREHENSIVE METABOLIC PANEL WITH GFR
ALT: 38 U/L (ref 0–44)
AST: 51 U/L — ABNORMAL HIGH (ref 15–41)
Albumin: 1.7 g/dL — ABNORMAL LOW (ref 3.5–5.0)
Alkaline Phosphatase: 1621 U/L — ABNORMAL HIGH (ref 38–126)
Anion gap: 10 (ref 5–15)
BUN: 19 mg/dL (ref 6–20)
CO2: 18 mmol/L — ABNORMAL LOW (ref 22–32)
Calcium: 8.7 mg/dL — ABNORMAL LOW (ref 8.9–10.3)
Chloride: 103 mmol/L (ref 98–111)
Creatinine, Ser: 0.7 mg/dL (ref 0.44–1.00)
GFR calc Af Amer: >60 mL/min
GFR calc non Af Amer: >60 mL/min
Glucose, Bld: 101 mg/dL — ABNORMAL HIGH (ref 70–99)
Potassium: 5.8 mmol/L — ABNORMAL HIGH (ref 3.5–5.1)
Sodium: 131 mmol/L — ABNORMAL LOW (ref 135–145)
Total Bilirubin: 1.2 mg/dL (ref 0.3–1.2)
Total Protein: 6.9 g/dL (ref 6.5–8.1)

## 2019-01-12 LAB — RESPIRATORY PANEL BY RT PCR (FLU A&B, COVID)
Influenza A by PCR: NEGATIVE
Influenza B by PCR: NEGATIVE
SARS Coronavirus 2 by RT PCR: POSITIVE — AB

## 2019-01-12 LAB — LACTIC ACID, PLASMA: Lactic Acid, Venous: 0.8 mmol/L (ref 0.5–1.9)

## 2019-01-12 MED ORDER — SODIUM CHLORIDE 0.9 % IV SOLN
100.0000 mg | Freq: Every day | INTRAVENOUS | Status: AC
  Administered 2019-01-13 – 2019-01-16 (×4): 100 mg via INTRAVENOUS
  Filled 2019-01-12 (×2): qty 20
  Filled 2019-01-12: qty 100
  Filled 2019-01-12 (×3): qty 20

## 2019-01-12 MED ORDER — DEXAMETHASONE SODIUM PHOSPHATE 10 MG/ML IJ SOLN
6.0000 mg | INTRAMUSCULAR | Status: DC
  Administered 2019-01-12 – 2019-01-17 (×6): 6 mg via INTRAVENOUS
  Filled 2019-01-12 (×6): qty 1

## 2019-01-12 MED ORDER — SODIUM CHLORIDE 0.9 % IV BOLUS
1000.0000 mL | Freq: Once | INTRAVENOUS | Status: AC
  Administered 2019-01-12: 20:00:00 1000 mL via INTRAVENOUS

## 2019-01-12 MED ORDER — SODIUM CHLORIDE 0.9 % IV SOLN
200.0000 mg | Freq: Once | INTRAVENOUS | Status: AC
  Administered 2019-01-12: 200 mg via INTRAVENOUS
  Filled 2019-01-12: qty 40

## 2019-01-12 MED ORDER — IOHEXOL 300 MG/ML  SOLN
80.0000 mL | Freq: Once | INTRAMUSCULAR | Status: AC | PRN
  Administered 2019-01-12: 22:00:00 80 mL via INTRAVENOUS

## 2019-01-12 MED ORDER — SODIUM CHLORIDE 0.9 % IV BOLUS
500.0000 mL | Freq: Once | INTRAVENOUS | Status: AC
  Administered 2019-01-12: 500 mL via INTRAVENOUS

## 2019-01-12 MED ORDER — SODIUM CHLORIDE 0.9 % IV SOLN
2.0000 g | Freq: Once | INTRAVENOUS | Status: AC
  Administered 2019-01-12: 18:00:00 2 g via INTRAVENOUS
  Filled 2019-01-12: qty 2

## 2019-01-12 MED ORDER — VANCOMYCIN HCL IN DEXTROSE 1-5 GM/200ML-% IV SOLN: 1000.0000 mg | Freq: Once | INTRAVENOUS | Status: DC

## 2019-01-12 MED ORDER — VANCOMYCIN HCL IN DEXTROSE 1-5 GM/200ML-% IV SOLN
1000.0000 mg | INTRAVENOUS | Status: DC
  Administered 2019-01-12 – 2019-01-18 (×7): 1000 mg via INTRAVENOUS
  Filled 2019-01-12 (×7): qty 200

## 2019-01-12 MED ORDER — SODIUM CHLORIDE 0.9 % IV SOLN
2.0000 g | Freq: Three times a day (TID) | INTRAVENOUS | Status: DC
  Administered 2019-01-13 – 2019-01-19 (×21): 2 g via INTRAVENOUS
  Filled 2019-01-12 (×21): qty 2

## 2019-01-12 MED ORDER — ENOXAPARIN SODIUM 40 MG/0.4ML ~~LOC~~ SOLN
40.0000 mg | SUBCUTANEOUS | Status: DC
  Administered 2019-01-12: 23:00:00 40 mg via SUBCUTANEOUS
  Filled 2019-01-12: qty 0.4

## 2019-01-12 NOTE — ED Triage Notes (Signed)
Pt here from [place], staff states that since breakfast she has been more lethargic and sleepy. Staff states maybe she was just "sleeping hard" but also says she looks like this when she is septic. Extensive bed sores to backside. Normally wears 3L via trach collar. Low 80s% on 4L. Placed on NRB by EMS with improvement. Alert to voice.

## 2019-01-12 NOTE — Progress Notes (Signed)
Pharmacy Antibiotic Note  Angela Page is a 48 y.o. female admitted on 01/09/2019 with sepsis.  Pharmacy has been consulted for Vancomycin dosing.   Weight: 102 lb 8 oz (46.5 kg)  Temp (24hrs), Avg:97.8 F (36.6 C), Min:97.8 F (36.6 C), Max:97.8 F (36.6 C)  Recent Labs  Lab 01/13/2019 1722 12/25/2018 1730  WBC 9.8  --   CREATININE 0.70  --   LATICACIDVEN  --  0.8    Estimated Creatinine Clearance: 63.1 mL/min (by C-G formula based on SCr of 0.7 mg/dL).    Allergies  Allergen Reactions  . Penicillins Hives    Has patient had a PCN reaction causing immediate rash, facial/tongue/throat swelling, SOB or lightheadedness with hypotension: Yes Has patient had a PCN reaction causing severe rash involving mucus membranes or skin necrosis: Yes Has patient had a PCN reaction that required hospitalization No Has patient had a PCN reaction occurring within the last 10 years: No-childhood allergy If all of the above answers are "NO", then may proceed with Cephalosporin  Tolerated cephalosporins in past  . Erythromycin Nausea And Vomiting  . Nitrofurantoin Monohyd Macro Nausea And Vomiting  . Oxybutynin Other (See Comments)    Dries mouth out     Antimicrobials this admission: 12/23 Cefepime >>  12/23 Vancomycin >>   Dose adjustments this admission:   Microbiology results: 12/23 BCx: Pending 12/23 UCx: Pending  . Plan: - Will start Vancomycin 1000 mg IV q 24 hours  - Est Calc AUC 530  - Monitor patients renal fxn and adjust as needed  - Hx of MDRO and MRSA follow cultures    Thank you for allowing pharmacy to be a part of this patient's care.  Duanne Limerick PharmD. BCPS  01/20/2019 6:31 PM

## 2019-01-12 NOTE — H&P (Signed)
History and Physical    Angela Page [id number] DOB: 1970/03/16 DOA: 12/29/2018  PCP: Debbe Bales MD Patient coming from: [place] have personally briefly reviewed patient's old medical records in [place]  Chief Complaint: lethargy  HPI: Angela Page is a 48 y.o. female with medical history significant of Hx of quadriplegia following spinal cord injury with trach and PEG dependency chronically on 3L, chronic diastolic CHF, neurogenic orthostatic hypotension, COPD who presented from facility with concerns of lethargy. Patient is able to nod to yes or no to questions and attempts to answer questions although difficult to hear due to her tracheostomy.  Reportedly she was sent over by staff due to lethargy and concerns for sepsis.  She was found to be hypoxic down to 80% on 4 L and had to be placed on nonrebreather by EMS.  Patient denies any shortness of breath or chest pain.  Denies any nausea or vomiting.  No diarrhea or abdominal pain.  She only complains of pain in her hands which are worse than usual.  ED Course: Patient is afebrile and hypotensive down to 90 over 70s on 5 L via trach collar. CBC shows no leukocytosis, hemoglobin of 7.1 down from 8.6 four months ago. Sodium of 131, potassium of 5.8, glucose of 101, AST of 51, ALT of 38, alkaline phosphatase of 1621. Lactate of 0.8.  Urinalysis showed positive nitrite, large leukocyte and many bacteria.  Left tibia/fibula x-ray showed osseous demineralization with cortical destruction of the lateral margin of the mid to distal left fibula consistent with osteomyelitis.  Chest x-ray showed no acute abnormalities.  Review of Systems: Unable to fully obtain since patient is on trach collar and has difficulty speaking  Past Medical History:  Diagnosis Date   Acute on chronic respiratory failure with hypoxia ([place])    Anasarca 06/10/2016   Asthma    At high risk for severe sepsis    Cocaine use  10/08/2013   Decubitus ulcer of buttock, unstageable (HCC)    Depression    Diabetes mellitus without complication (HCC)    GERD (gastroesophageal reflux disease)    HCAP (healthcare-associated pneumonia) 06/09/2016   Hepatitis    hx of hepatits frm mono    Kidney stone    Lobar pneumonia (HCC)    Overdose of opiate or related narcotic ([place]) 09/02/2014   Pacemaker    Pleurisy    Polysubstance dependence including opioid type drug, episodic abuse ([place]) 10/27/2013   Protein calorie malnutrition ([place]) 10/31/2015   Quadriparesis ([place])    Quadriplegia and quadriparesis ([place])    Quadriplegia, C5-C7 incomplete ([place]) 10/08/2013   Stage IV pressure ulcer of sacral region ([place]) 10/31/2015   Tracheostomy status ([place])     Past Surgical History:  Procedure Laterality Date   APPENDECTOMY     BACK SURGERY  10/08/2013   fusion of spine, cervical region   [place] Right 03/09/2017   Procedure: CYSTOSCOPY [name] STENT PLACEMENT;  Surgeon: Irine Seal, MD;  Location: [facility];  Service: Urology;  Laterality: Right;   CYSTOSCOPY W/ URETERAL STENT REMOVAL Right 07/16/2017   Procedure: CYSTOSCOPY WITH STENT REMOVAL;  Surgeon: Franchot Gallo, MD;  Location: [facility];  Service: Urology;  Laterality: Right;   CYSTOSCOPY/URETEROSCOPY/HOLMIUM LASER/STENT PLACEMENT Right 07/16/2017   Procedure: CYSTOSCOPY/URETEROSCOPY/HOLMIUM LASER/STENT PLACEMENT/ALSO STONE EXTRACTION;  Surgeon: Franchot Gallo, MD;  Location: [facility];  Service: Urology;  Laterality: Right;   GASTROSTOMY  11/23/2015  I & D EXTREMITY  10/28/2011   Procedure: IRRIGATION AND DEBRIDEMENT EXTREMITY;  Surgeon: Tennis Must, MD;  Location: [facility];  Service: Orthopedics;  Laterality: Right;  Irrigation and debridement right middle finger   INSERTION OF SUPRAPUBIC CATHETER N/A 07/28/2016   Procedure: INSERTION OF SUPRAPUBIC CATHETER;  Surgeon: Franchot Gallo, MD;  Location: [facility];  Service: Urology;  Laterality: N/A;   IR GENERIC HISTORICAL  11/23/2015   IR GASTROSTOMY TUBE MOD SED 11/23/2015 [facility]   IR PATIENT EVAL TECH 0-60 MINS  12/18/2017   IR REPLACE G-TUBE SIMPLE WO FLUORO  12/19/2017   LITHOTRIPSY  08/2017   TRACHEOSTOMY       reports that she has quit smoking. Her smoking use included cigarettes. She has a 25.00 pack-year smoking history. She has never used smokeless tobacco. She reports that she does not drink alcohol or use drugs.  Allergies  Allergen Reactions   Penicillins Hives    Has patient had a PCN reaction causing immediate rash, facial/tongue/throat swelling, SOB or lightheadedness with hypotension: Yes Has patient had a PCN reaction causing severe rash involving mucus membranes or skin necrosis: Yes Has patient had a PCN reaction that required hospitalization No Has patient had a PCN reaction occurring within the last 10 years: No-childhood allergy If all of the above answers are "NO", then may proceed with Cephalosporin  Tolerated cephalosporins in past   Erythromycin Nausea And Vomiting   Nitrofurantoin Monohyd Macro Nausea And Vomiting   Oxybutynin Other (See Comments)    Dries mouth out     Family History  Problem Relation Age of Onset   Hypertension Maternal Uncle    Kidney failure Maternal Uncle    Colon cancer Mother    Lung cancer Father    Hypertension Brother    Kidney failure Maternal Aunt    Kidney failure Maternal Uncle    Colon cancer Maternal Grandfather    Esophageal cancer Neg Hx      Prior to Admission medications   Medication Sig Start Date End Date Taking? Authorizing Provider  acetaminophen (TYLENOL) 500 MG tablet Take 1 tablet (500 mg total) by mouth every 8 (eight) hours as needed for mild pain. 08/24/18   Hosie Poisson, MD  Amino Acids-Protein Hydrolys (FEEDING SUPPLEMENT, PRO-STAT SUGAR FREE 64,) LIQD Place 30 mLs into feeding tube 3 (three) times  daily. Patient not taking: Reported on 10/21/2018 08/23/18   Hosie Poisson, MD  baclofen (LIORESAL) 5 mg TABS tablet Take 5 mg by mouth 3 (three) times daily.    [provider]  bisacodyl (DULCOLAX) 10 MG suppository Place 1 suppository (10 mg total) rectally daily as needed for mild constipation. 08/23/18   Hosie Poisson, MD  buPROPion (WELLBUTRIN) 75 MG tablet Take 75 mg by mouth 2 (two) times daily. Via peg tube    [provider]  ciprofloxacin (CIPRO) 500 MG tablet Take 500 mg by mouth 2 (two) times daily. Via peg tube    [provider]  collagenase (SANTYL) ointment Apply 1 application topically daily. L lateral ankle    [provider]  docusate (COLACE) 50 MG/5ML liquid Place 10 mLs (100 mg total) into feeding tube daily. Patient not taking: Reported on 10/21/2018 08/24/18   Hosie Poisson, MD  docusate sodium (COLACE) 100 MG capsule Take 100 mg by mouth 2 (two) times daily.    [provider]  Dulaglutide (TRULICITY) 1.5 0000000 SOPN Inject 1.5 mg into the skin every Friday.  [provider]  DULoxetine (CYMBALTA) 30 MG capsule Take 1 capsule (30 mg total) by mouth 2 (two) times daily. Patient not taking: Reported on 10/21/2018 02/17/18   Edwin Dada, MD  DULoxetine (CYMBALTA) 60 MG capsule Take 60 mg by mouth daily.    [provider]  ferrous sulfate 300 (60 Fe) MG/5ML syrup Place 3.7 mLs (220 mg total) into feeding tube daily with breakfast. Patient not taking: Reported on 08/10/2018 04/11/18   Kayleen Memos, DO  ferrous sulfate 325 (65 FE) MG tablet Take 325 mg by mouth daily with breakfast.    [provider]  gabapentin (NEURONTIN) 300 MG capsule Take 300 mg by mouth 3 (three) times daily.    [provider]  insulin aspart (NOVOLOG) 100 UNIT/ML injection Inject 10 Units into the skin every 4 (four) hours. For glucose 150 to 200 use 1 units, for glucose 201-250 use 2 units, for 251 to 300 use 4  units, for 301 to 350 use 6 units, for 351 to 400 use 8 units, for 401 or greater use 10 units. Patient taking differently: Inject 0-8 Units into the skin See admin instructions. Check FSBS every 4 hours. Give insulin according to sliding scale:  0-70 call MD 71-149 0 units 150-200 1 unit 201-250 2 units 251-300 4 units 301-350 6 units 351-400 8 units >400 call MD 12/31/17   Arrien, Jimmy Picket, MD  Insulin Glargine (BASAGLAR KWIKPEN) 100 UNIT/ML SOPN Inject 0.05 mLs (5 Units total) into the skin at bedtime. 04/11/18   Kayleen Memos, DO  lactulose (CHRONULAC) 10 GM/15ML solution Take 20 g by mouth daily.    [provider]  lip balm (CARMEX) ointment Apply topically as needed for lip care. Patient not taking: Reported on 10/21/2018 06/27/18   Georgette Shell, MD  loratadine (CLARITIN) 10 MG tablet Take 10 mg by mouth daily.     [provider]  midodrine (PROAMATINE) 10 MG tablet Take 10 mg by mouth every 8 (eight) hours as needed (hypotension). SBP 100 or less    [provider]  Multiple Vitamin (MULTIVITAMIN WITH MINERALS) TABS tablet Take 1 tablet by mouth daily. Via peg tube    [provider]  Nutritional Supplements (FEEDING SUPPLEMENT, GLUCERNA 1.2 CAL,) LIQD Place 1,000 mLs into feeding tube daily. Patient taking differently: Place 1,000 mLs into feeding tube See admin instructions. 59ml/h from 10pm-10am 02/18/18   Danford, Suann Larry, MD  Nutritional Supplements (FEEDING SUPPLEMENT, GLUCERNA 1.2 CAL,) LIQD Place 400 mLs into feeding tube daily. Patient not taking: Reported on 10/21/2018 08/24/18   Hosie Poisson, MD  Nutritional Supplements (PROMOD) LIQD Take 30 mLs by mouth 3 (three) times daily.    [provider]  ondansetron (ZOFRAN) 4 MG tablet Take 4 mg by mouth every 6 (six) hours as needed for nausea or vomiting.    [provider]  oxycodone (OXY-IR) 5 MG capsule Take 1 capsule (5 mg total) by mouth every 8 (eight)  hours as needed for pain. 08/24/18   Hosie Poisson, MD  OXYGEN Inhale 3 L into the lungs continuous.    [provider]  pantoprazole (PROTONIX) 40 MG tablet Take 40 mg by mouth 2 (two) times daily.    [provider]  polyethylene glycol (MIRALAX / GLYCOLAX) packet Take 17 g by mouth daily. Hold if diarrhea. Patient taking differently: Take 17 g by mouth 2 (two) times daily. Hold if diarrhea. 01/01/18   Arrien, Jimmy Picket, MD  potassium  chloride 20 MEQ/15ML (10%) SOLN Take 40 mEq by mouth 2 (two) times daily.    [provider]  simethicone (MYLICON) 80 MG chewable tablet Chew 80 mg by mouth every 6 (six) hours as needed for flatulence. Via peg tube    [provider]  tiotropium (SPIRIVA) 18 MCG inhalation capsule Place 18 mcg into inhaler and inhale daily.    [provider]  zolpidem (AMBIEN) 5 MG tablet Take 0.5 tablets (2.5 mg total) by mouth at bedtime. 08/24/18   Hosie Poisson, MD    Physical Exam: Vitals:   12/31/2018 1815 12/26/2018 1830 01/17/2019 1845 01/03/2019 2015  BP: 98/74 93/72 90/69  90/73  Pulse: 93 93 93   Resp: 15 14 13 15   Temp:      TempSrc:      SpO2: 95% 96% 99%   Weight:        Constitutional: non toxic appearing female laying in bed Vitals:   12/25/2018 1815 01/08/2019 1830 01/10/2019 1845 01/10/2019 2015  BP: 98/74 93/72 90/69  90/73  Pulse: 93 93 93   Resp: 15 14 13 15   Temp:      TempSrc:      SpO2: 95% 96% 99%   Weight:       Eyes: PERRL, lids and conjunctivae normal ENMT: Mucous membranes are moist Neck: trach collar in place  Respiratory: clear to auscultation bilaterally, no wheezing, no crackles. Normal respiratory effort on trach collar on 5L  Cardiovascular: Regular rate and rhythm, no murmurs / rubs / gallops. No extremity edema.  Abdomen: no tenderness, PEG tube in mid abdomen. Bowel sounds positive.  Musculoskeletal: no clubbing / cyanosis. Contractures of all extremities.   Back: Large area of erythema and  2 ulcers and purulent drainage spanning the entire left side of the back Buttock: unable to visualized since unable to turn patient without assistance Skin: 2 necrotic appearing ulcers on left lateral fibular region  Neurologic: pt is a quadriplegic in decorticate posturing.  psychiatric: Normal judgment and insight. Alert and oriented x 3. Normal mood.     Labs on Admission: I have personally reviewed following labs and imaging studies  CBC: Recent Labs  Lab 01/11/2019 1722  WBC 9.8  NEUTROABS 6.5  HGB 7.1*  HCT 25.5*  MCV 85.0  PLT 123XX123*   Basic Metabolic Panel: Recent Labs  Lab 12/31/2018 1722  NA 131*  K 5.8*  CL 103  CO2 18*  GLUCOSE 101*  BUN 19  CREATININE 0.70  CALCIUM 8.7*   GFR: Estimated Creatinine Clearance: 63.1 mL/min (by C-G formula based on SCr of 0.7 mg/dL). Liver Function Tests: Recent Labs  Lab 12/29/2018 1722  AST 51*  ALT 38  ALKPHOS 1,621*  BILITOT 1.2  PROT 6.9  ALBUMIN 1.7*   No results for input(s): LIPASE, AMYLASE in the last 168 hours. No results for input(s): AMMONIA in the last 168 hours. Coagulation Profile: No results for input(s): INR, PROTIME in the last 168 hours. Cardiac Enzymes: No results for input(s): CKTOTAL, CKMB, CKMBINDEX, TROPONINI in the last 168 hours. BNP (last 3 results) No results for input(s): PROBNP in the last 8760 hours. HbA1C: No results for input(s): HGBA1C in the last 72 hours. CBG: No results for input(s): GLUCAP in the last 168 hours. Lipid Profile: No results for input(s): CHOL, HDL, LDLCALC, TRIG, CHOLHDL, LDLDIRECT in the last 72 hours. Thyroid Function Tests: No results for input(s): TSH, T4TOTAL, FREET4, T3FREE, THYROIDAB in the last 72 hours. Anemia Panel: No results for input(s):  VITAMINB12, FOLATE, FERRITIN, TIBC, IRON, RETICCTPCT in the last 72 hours. Urine analysis:    Component Value Date/Time   COLORURINE AMBER (A) 01/17/2019 2006   APPEARANCEUR TURBID (A) 12/30/2018 2006   LABSPEC  1.020 12/22/2018 2006   PHURINE 9.0 (H) 01/01/2019 2006   GLUCOSEU NEGATIVE 01/11/2019 2006   GLUCOSEU NEGATIVE 04/28/2017 1121   HGBUR NEGATIVE 12/31/2018 2006   BILIRUBINUR SMALL (A) 12/31/2018 2006   KETONESUR NEGATIVE 01/04/2019 2006   PROTEINUR >=300 (A) 12/25/2018 2006   UROBILINOGEN 0.2 04/28/2017 1121   NITRITE POSITIVE (A) 01/03/2019 2006   LEUKOCYTESUR LARGE (A) 12/26/2018 2006    Radiological Exams on Admission: DG Tibia/Fibula Left  Result Date: 01/20/2019 CLINICAL DATA:  Altered mental status, lethargy, sleeping, infection EXAM: LEFT TIBIA AND FIBULA - 2 VIEW COMPARISON:  Portable exam 1723 hours without priors for comparison FINDINGS: Diffuse osseous demineralization. Knee and ankle joint alignments normal. No acute fracture or dislocation. Soft tissue irregularity at the lateral aspect of lower LEFT leg mid to distally. Associated cortical destruction of the lateral distal fibula consistent with osteomyelitis. IMPRESSION: Osseous demineralization with cortical destruction of the lateral margin of the mid to distal LEFT fibula consistent with osteomyelitis. Findings called to Dr. Alvino Chapel on 01/19/2019 at 1753 hours. Electronically Signed   By: Lavonia Dana M.D.   On: 01/20/2019 17:54   DG Chest Portable 1 View  Result Date: 12/27/2018 CLINICAL DATA:  Lethargy, sleeping, multiple source EXAM: PORTABLE CHEST 1 VIEW COMPARISON:  Portable exam 1716 hours compared to 08/19/2018 FINDINGS: Tracheostomy tube projects over tracheal air column. LEFT subclavian sequential transvenous pacemaker leads project over RIGHT atrium and RIGHT ventricle. Upper normal heart size. Mediastinal contours and pulmonary vascularity normal. Lungs grossly clear. No infiltrate, pleural effusion or pneumothorax. Prior cervical spine fusion. IMPRESSION: No acute abnormalities. Electronically Signed   By: Lavonia Dana M.D.   On: 12/31/2018 17:55    EKG: Independently reviewed.   Assessment/Plan  Acute on  chronic hypoxia in the setting of COVID infection with tracheostomy Baseline on 3 L-now 5L Start IV Decadron and remdesivir  Cellulitis of the back  Continue vancomycin and cefepime  Osteomyelitis of the left fibula  Will need to consult with orthopedic   UTI with suprapubic catheter  Continue vancomycin and cefepime CT abdomen pelvis final report pending but call back to ER shows possible erosion of the suprapubic catheter through the bladder Will need to consult urology  Stage 4 decubitus ulcer CT abdomen and pelvis pending Wound care nurse to access and consult surgery for debridement if needed  Hypotension due to multiple infectious source 500 cc bolus  Hyperkalemia K of 5.8 Kayexalate once in tube  Acute on Chronic anemia Hemoglobin of 7.1.  Transfusion threshold < 7. No obvious bleed   Quadriplegic due to spinal cord injury Poor prognosis with ongoing infections  PEG tube dependent -Consult dietitian  Medication reconciliation not complete at the time of admission.  Will need to follow-up.  DVT prophylaxis:.Lovenox Code Status: Discussed and patient was to be FULL code Family Communication: Plan discussed with patient at bedside  disposition Plan: Home with at least 2 midnight stays  Consults called:  Admission status: inpatient to progressive unit  Angela Page T Quron Ruddy DO [facility]  If 7PM-7AM, please contact night-coverage [email or web address] Password [facility]  01/02/2019, 10:11 PM

## 2019-01-12 NOTE — ED Provider Notes (Signed)
[facility] EMERGENCY DEPARTMENT Provider Note   CSN: [id number] Arrival date & time: 01/07/2019  1655     History Chief Complaint  Patient presents with  . Altered Mental Status    Angela Page is a 48 y.o. female. Level 5 caveat due to altered mental status. HPI  Patient brought in from [place].  Mental status changes.  More sleepy.  Reportedly has sats in the low 80s on 4 L.  Normally wears 3 L via trach collar.  Staff report he said she gets this way when she is septic.  Oxygenation improved with nonrebreather placed over face.  Reportedly had trach changed out by nursing staff at the nursing home.  Has reported multiple decubitus ulcers.     Past Medical History:  Diagnosis Date  . Acute on chronic respiratory failure with hypoxia ([place])   . Anasarca 06/10/2016  . Asthma   . At high risk for severe sepsis   . Cocaine use 10/08/2013  . Decubitus ulcer of buttock, unstageable ([place])   . Depression   . Diabetes mellitus without complication ([place])   . GERD (gastroesophageal reflux disease)   . HCAP (healthcare-associated pneumonia) 06/09/2016  . Hepatitis    hx of hepatits frm mono   . Kidney stone   . Lobar pneumonia ([place])   . Overdose of opiate or related narcotic ([place]) 09/02/2014  . Pacemaker   . Pleurisy   . Polysubstance dependence including opioid type drug, episodic abuse ([place]) 10/27/2013  . Protein calorie malnutrition ([place]) 10/31/2015  . Quadriparesis ([place])   . Quadriplegia and quadriparesis ([place])   . Quadriplegia, C5-C7 incomplete ([place]) 10/08/2013  . Stage IV pressure ulcer of sacral region ([employer]) 10/31/2015  . Tracheostomy status [facility])     Patient Active Problem List   Diagnosis Date Noted  . SBO (small bowel obstruction) ([place]) 08/10/2018  . Rectus sheath hematoma, initial encounter   . Acute encephalopathy 07/20/2018  . Hypercalcemia 06/23/2018  . Thrombocytosis ([place]) 06/22/2018  . Chronic respiratory failure with hypoxia  ([place])   . Acute on chronic respiratory failure ([place])   . Quadriplegia and quadriparesis ([place])   . At high risk for severe sepsis   . Decubitus ulcer of buttock, unstageable ([place])   . Lactic acid acidosis 12/16/2017  . Suprapubic catheter ([place]) 12/16/2017  . Pressure injury of skin 07/17/2017  . Kidney stone 07/16/2017  . Right ureteral stone 03/10/2017  . Complicated UTI (urinary tract infection) 03/09/2017  . Ineffective airway clearance   . Sepsis secondary to UTI ([place]) 06/30/2016  . Autonomic neuropathy 06/10/2016  . Presence of permanent cardiac pacemaker 06/10/2016  . 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 Chronically on opiate therapy 06/10/2016  . PEG (percutaneous endoscopic gastrostomy) status ([place]) 06/10/2016  . Pulmonary hypertension ([place]) 06/10/2016  . Hypoalbuminemia 06/10/2016  . Peripheral edema   . Tracheostomy dependence ([place])   . Chronic obstructive pulmonary disease ([place])   . Chronic diastolic CHF (congestive heart failure) ([place]) 01/23/2016  . Tracheostomy status ([place])   . Esophageal dysphagia   . Malnutrition of moderate degree 11/01/2015  . Tobacco abuse 10/31/2015  . Asthma 10/31/2015  . History of pneumonia 10/08/2015  . Pressure ulcer of contiguous region involving buttock and hip, stage 4 ([place]) 02/26/2015  . Symptomatic anemia 02/25/2015  . Altered mental status 10/29/2014  . Acute metabolic encephalopathy 83/66/2947  . Leukocytosis 10/23/2014  . Hypokalemia 10/23/2014  . Decubitus ulcer of sacral region, stage 4 ([place]) 10/23/2014  .  Anemia, iron deficiency 10/23/2014  . Quadriplegia ([place]) 10/22/2014  . Depression   . Protein-calorie malnutrition, severe ([place]) 09/05/2014  . 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 Recurrent UTI 03/31/2014  . Pressure ulcer of coccygeal region, stage 4 ([place]) 01/06/2014  . Stage 4 skin ulcer of sacral region ([place]) 01/06/2014  . Neuropathic pain 11/30/2013  . Paraplegia following spinal  cord injury ([place]) 11/30/2013  . PEG (percutaneous endoscopic gastrostomy) adjustment/replacement/removal ([place]) 11/30/2013  . Spinal cord injury, cervical region ([place]) 11/30/2013  . Chronic pain due to injury 11/30/2013  . S/P cervical spinal fusion 10/27/2013  . Tracheostomy care ([place]) 10/27/2013  . 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 SCI (spinal cord injury) 10/11/2013    Past Surgical History:  Procedure Laterality Date  . APPENDECTOMY    . BACK SURGERY  10/08/2013   fusion of spine, cervical region  . CARDIAC SURGERY    . CYSTOSCOPY W/ URETERAL STENT PLACEMENT Right 03/09/2017   Procedure: CYSTOSCOPY [name] STENT PLACEMENT;  Surgeon: Irine Seal, MD;  Location: [facility];  Service: Urology;  Laterality: Right;  . CYSTOSCOPY W/ URETERAL STENT REMOVAL Right 07/16/2017   Procedure: CYSTOSCOPY WITH STENT REMOVAL;  Surgeon: Franchot Gallo, MD;  Location: [facility];  Service: Urology;  Laterality: Right;  . CYSTOSCOPY/URETEROSCOPY/HOLMIUM LASER/STENT PLACEMENT Right 07/16/2017   Procedure: CYSTOSCOPY/URETEROSCOPY/HOLMIUM LASER/STENT PLACEMENT/ALSO STONE EXTRACTION;  Surgeon: Franchot Gallo, MD;  Location: [facility];  Service: Urology;  Laterality: Right;  . GASTROSTOMY  11/23/2015  . I & D EXTREMITY  10/28/2011   Procedure: IRRIGATION AND DEBRIDEMENT EXTREMITY;  Surgeon: Tennis Must, MD;  Location: [facility];  Service: Orthopedics;  Laterality: Right;  Irrigation and debridement right middle finger  . INSERTION OF SUPRAPUBIC CATHETER N/A 07/28/2016   Procedure: INSERTION OF SUPRAPUBIC CATHETER;  Surgeon: Franchot Gallo, MD;  Location: [facility];  Service: Urology;  Laterality: N/A;  . IR GENERIC HISTORICAL  11/23/2015   IR GASTROSTOMY TUBE MOD SED 11/23/2015 [facility]  . IR PATIENT EVAL TECH 0-60 MINS  12/18/2017  . IR REPLACE G-TUBE SIMPLE WO FLUORO  12/19/2017  . LITHOTRIPSY  08/2017  . TRACHEOSTOMY       OB History   No obstetric history on file.      Family History  Problem Relation Age of Onset  . Hypertension Maternal Uncle   . Kidney failure Maternal Uncle   . Colon cancer Mother   . Lung cancer Father   . Hypertension Brother   . Kidney failure Maternal Aunt   . Kidney failure Maternal Uncle   . Colon cancer Maternal Grandfather   . Esophageal cancer Neg Hx     Social History   Tobacco Use  . Smoking status: Former Smoker    Packs/day: 1.00    Years: 25.00    Pack years: 25.00    Types: Cigarettes  . Smokeless tobacco: Never Used  Substance Use Topics  . Alcohol use: Never  . Drug use: No    Home Medications Prior to Admission medications   Medication Sig Start Date End Date Taking? Authorizing Provider  acetaminophen (TYLENOL) 500 MG tablet Take 1 tablet (500 mg total) by mouth every 8 (eight) hours as needed for mild pain. 08/24/18   Hosie Poisson, MD  Amino Acids-Protein Hydrolys (FEEDING SUPPLEMENT, PRO-STAT SUGAR FREE 64,) LIQD Place 30 mLs into feeding tube 3 (three) times daily. Patient not taking: Reported on 10/21/2018 08/23/18   Hosie Poisson, MD  baclofen (LIORESAL) 5 mg TABS tablet Take 5  mg by mouth 3 (three) times daily.    [provider]  bisacodyl (DULCOLAX) 10 MG suppository Place 1 suppository (10 mg total) rectally daily as needed for mild constipation. 08/23/18   Hosie Poisson, MD  buPROPion (WELLBUTRIN) 75 MG tablet Take 75 mg by mouth 2 (two) times daily. Via peg tube    [provider]  ciprofloxacin (CIPRO) 500 MG tablet Take 500 mg by mouth 2 (two) times daily. Via peg tube    [provider]  collagenase (SANTYL) ointment Apply 1 application topically daily. L lateral ankle    [provider]  docusate (COLACE) 50 MG/5ML liquid Place 10 mLs (100 mg total) into feeding tube daily. Patient not taking: Reported on 10/21/2018 08/24/18   Hosie Poisson, MD  docusate sodium (COLACE) 100 MG capsule Take 100 mg by mouth 2 (two) times daily.    [provider]   Dulaglutide (TRULICITY) 1.5 XB/2.8UX SOPN Inject 1.5 mg into the skin every Friday.    [provider]  DULoxetine (CYMBALTA) 30 MG capsule Take 1 capsule (30 mg total) by mouth 2 (two) times daily. Patient not taking: Reported on 10/21/2018 02/17/18   Edwin Dada, MD  DULoxetine (CYMBALTA) 60 MG capsule Take 60 mg by mouth daily.    [provider]  ferrous sulfate 300 (60 Fe) MG/5ML syrup Place 3.7 mLs (220 mg total) into feeding tube daily with breakfast. Patient not taking: Reported on 08/10/2018 04/11/18   Kayleen Memos, DO  ferrous sulfate 325 (65 FE) MG tablet Take 325 mg by mouth daily with breakfast.    [provider]  gabapentin (NEURONTIN) 300 MG capsule Take 300 mg by mouth 3 (three) times daily.    [provider]  insulin aspart (NOVOLOG) 100 UNIT/ML injection Inject 10 Units into the skin every 4 (four) hours. For glucose 150 to 200 use 1 units, for glucose 201-250 use 2 units, for 251 to 300 use 4 units, for 301 to 350 use 6 units, for 351 to 400 use 8 units, for 401 or greater use 10 units. Patient taking differently: Inject 0-8 Units into the skin See admin instructions. Check FSBS every 4 hours. Give insulin according to sliding scale:  0-70 call MD 71-149 0 units 150-200 1 unit 201-250 2 units 251-300 4 units 301-350 6 units 351-400 8 units >400 call MD 12/31/17   Arrien, Jimmy Picket, MD  Insulin Glargine (BASAGLAR KWIKPEN) 100 UNIT/ML SOPN Inject 0.05 mLs (5 Units total) into the skin at bedtime. 04/11/18   Kayleen Memos, DO  lactulose (CHRONULAC) 10 GM/15ML solution Take 20 g by mouth daily.    [provider]  lip balm (CARMEX) ointment Apply topically as needed for lip care. Patient not taking: Reported on 10/21/2018 06/27/18   Georgette Shell, MD  loratadine (CLARITIN) 10 MG tablet Take 10 mg by mouth daily.     [provider]  midodrine (PROAMATINE) 10 MG tablet Take 10 mg by mouth every 8 (eight)  hours as needed (hypotension). SBP 100 or less    [provider]  Multiple Vitamin (MULTIVITAMIN WITH MINERALS) TABS tablet Take 1 tablet by mouth daily. Via peg tube    [provider]  Nutritional Supplements (FEEDING SUPPLEMENT, GLUCERNA 1.2 CAL,) LIQD Place 1,000 mLs into feeding tube daily. Patient taking differently: Place 1,000 mLs into feeding tube See admin instructions. 58m/h from 10pm-10am 02/18/18   Danford, CSuann Larry MD  Nutritional Supplements (FEEDING SUPPLEMENT, GLUCERNA 1.2 CAL,)  LIQD Place 400 mLs into feeding tube daily. Patient not taking: Reported on 10/21/2018 08/24/18   Hosie Poisson, MD  Nutritional Supplements (PROMOD) LIQD Take 30 mLs by mouth 3 (three) times daily.    [provider]  ondansetron (ZOFRAN) 4 MG tablet Take 4 mg by mouth every 6 (six) hours as needed for nausea or vomiting.    [provider]  oxycodone (OXY-IR) 5 MG capsule Take 1 capsule (5 mg total) by mouth every 8 (eight) hours as needed for pain. 08/24/18   Hosie Poisson, MD  OXYGEN Inhale 3 L into the lungs continuous.    [provider]  pantoprazole (PROTONIX) 40 MG tablet Take 40 mg by mouth 2 (two) times daily.    [provider]  polyethylene glycol (MIRALAX / GLYCOLAX) packet Take 17 g by mouth daily. Hold if diarrhea. Patient taking differently: Take 17 g by mouth 2 (two) times daily. Hold if diarrhea. 01/01/18   Arrien, Jimmy Picket, MD  potassium chloride 20 MEQ/15ML (10%) SOLN Take 40 mEq by mouth 2 (two) times daily.    [provider]  simethicone (MYLICON) 80 MG chewable tablet Chew 80 mg by mouth every 6 (six) hours as needed for flatulence. Via peg tube    [provider]  tiotropium (SPIRIVA) 18 MCG inhalation capsule Place 18 mcg into inhaler and inhale daily.    [provider]  zolpidem (AMBIEN) 5 MG tablet Take 0.5 tablets (2.5 mg total) by mouth at bedtime. 08/24/18   Hosie Poisson, MD     Allergies    Penicillins, Erythromycin, Nitrofurantoin monohyd macro, and Oxybutynin  Review of Systems   Review of Systems  Unable to perform ROS: Mental status change    Physical Exam Updated Vital Signs BP 90/69   Pulse 93   Temp 97.8 F (36.6 C) (Rectal)   Resp 13   Wt 46.5 kg   LMP 05/27/2016 (Within Days) Comment: Irregular periods since October 2015.  SpO2 99%   BMI 18.75 kg/m   Physical Exam Vitals and nursing note reviewed. Exam conducted with a chaperone present.  Constitutional:      Comments: Sitting in bed with eyes closed and mouth open.  Will nod yes or no to some questions when aroused with stimulation.  HENT:     Head: Normocephalic.     Mouth/Throat:     Mouth: Mucous membranes are dry.  Eyes:     Conjunctiva/sclera: Conjunctivae normal.  Neck:     Comments: Trach in place.  Although no change on CO2 detector when placed over trach. Cardiovascular:     Rate and Rhythm: Regular rhythm.  Pulmonary:     Breath sounds: No rhonchi or rales.  Abdominal:     Comments: PEG in left upper abdomen.  Suprapubic catheter with some purulence around it.  Musculoskeletal:     Comments: Some contractions of upper and lower extremities.  Cachectic  Skin:    Comments: Multiple decubitus ulcers.  Ulcer on left chest.  Deeper ulcers on left hip sacral decub and right hip also ulcer left lateral lower leg.  May have purulence on some of the pelvic and the leg ulcers.  Neurological:     Comments: Cachectic.  Will arouse to stimulation but otherwise sitting in bed with eyes closed.  Will nod yes or no to some questions.     ED Results / Procedures / Treatments   Labs (all labs ordered are listed, but only abnormal results are displayed) Labs  Reviewed  RESPIRATORY PANEL BY RT PCR (FLU A&B, COVID) - Abnormal; Notable for the following components:      Result Value   SARS Coronavirus 2 by RT PCR POSITIVE (*)    All other components within normal limits  COMPREHENSIVE  METABOLIC PANEL - Abnormal; Notable for the following components:   Sodium 131 (*)    Potassium 5.8 (*)    CO2 18 (*)    Glucose, Bld 101 (*)    Calcium 8.7 (*)    Albumin 1.7 (*)    AST 51 (*)    Alkaline Phosphatase 1,621 (*)    All other components within normal limits  CBC WITH DIFFERENTIAL/PLATELET - Abnormal; Notable for the following components:   RBC 3.00 (*)    Hemoglobin 7.1 (*)    HCT 25.5 (*)    MCH 23.7 (*)    MCHC 27.8 (*)    RDW 23.2 (*)    Platelets 678 (*)    Basophils Absolute 0.2 (*)    All other components within normal limits  CULTURE, BLOOD (ROUTINE X 2)  CULTURE, BLOOD (ROUTINE X 2) W REFLEX TO ID PANEL  URINE CULTURE  LACTIC ACID, PLASMA  URINALYSIS, ROUTINE W REFLEX MICROSCOPIC    EKG None  Radiology DG Tibia/Fibula Left  Result Date: 01/11/2019 CLINICAL DATA:  Altered mental status, lethargy, sleeping, infection EXAM: LEFT TIBIA AND FIBULA - 2 VIEW COMPARISON:  Portable exam 1723 hours without priors for comparison FINDINGS: Diffuse osseous demineralization. Knee and ankle joint alignments normal. No acute fracture or dislocation. Soft tissue irregularity at the lateral aspect of lower LEFT leg mid to distally. Associated cortical destruction of the lateral distal fibula consistent with osteomyelitis. IMPRESSION: Osseous demineralization with cortical destruction of the lateral margin of the mid to distal LEFT fibula consistent with osteomyelitis. Findings called to Dr. Alvino Chapel on 01/15/2019 at 1753 hours. Electronically Signed   By: Lavonia Dana M.D.   On: 01/15/2019 17:54   DG Chest Portable 1 View  Result Date: 12/21/2018 CLINICAL DATA:  Lethargy, sleeping, multiple source EXAM: PORTABLE CHEST 1 VIEW COMPARISON:  Portable exam 1716 hours compared to 08/19/2018 FINDINGS: Tracheostomy tube projects over tracheal air column. LEFT subclavian sequential transvenous pacemaker leads project over RIGHT atrium and RIGHT ventricle. Upper normal heart size.  Mediastinal contours and pulmonary vascularity normal. Lungs grossly clear. No infiltrate, pleural effusion or pneumothorax. Prior cervical spine fusion. IMPRESSION: No acute abnormalities. Electronically Signed   By: Lavonia Dana M.D.   On: 01/09/2019 17:55    Procedures Procedures (including critical care time)  Medications Ordered in ED Medications  ceFEPIme (MAXIPIME) 2 g in sodium chloride 0.9 % 100 mL IVPB (has no administration in time range)  vancomycin (VANCOCIN) IVPB 1000 mg/200 mL premix (0 mg Intravenous Stopped 01/14/2019 1956)  sodium chloride 0.9 % bolus 1,000 mL (has no administration in time range)  ceFEPIme (MAXIPIME) 2 g in sodium chloride 0.9 % 100 mL IVPB (0 g Intravenous Stopped 01/18/2019 1837)    ED Course  I have reviewed the triage vital signs and the nursing notes.  Pertinent labs & imaging results that were available during my care of the patient were reviewed by me and considered in my medical decision making (see chart for details).    MDM Rules/Calculators/A&P                      Patient presents from nursing home.  Quadriplegic at baseline.  Was hypoxic.  X-ray reassuring.  Has osteomyelitis of left fibula and potentially could have other areas in the pelvis to.  However also found to be Covid positive.  Really not moving air through her trach but appears to be breathing fine through her mouth.  Requiring more than 4 L of oxygen to keep her sats up.  Patient also has some discharge through suprapubic site and will get CT scan to evaluate both for osteomyelitis and other intra-abdominal pathology.  Has elevated alk phos.  Since patient has potential criteria for [facility] will admit to hospitalist, although she is an unassigned patient.  CRITICAL CARE Performed by: Davonna Belling Total critical care time: 30 minutes Critical care time was exclusive of separately billable procedures and treating other patients. Critical care was necessary to treat  or prevent imminent or life-threatening deterioration. Critical care was time spent personally by me on the following activities: development of treatment plan with patient and/or surrogate as well as nursing, discussions with consultants, evaluation of patient's response to treatment, examination of patient, obtaining history from patient or surrogate, ordering and performing treatments and interventions, ordering and review of laboratory studies, ordering and review of radiographic studies, pulse oximetry and re-evaluation of patient's condition.  Final Clinical Impression(s) / ED Diagnoses Final diagnoses:  Osteomyelitis of left fibula, unspecified type ([place])  COVID-19    Rx / DC Orders ED Discharge Orders    None       Davonna Belling, MD 01/02/2019 2020

## 2019-01-13 ENCOUNTER — Inpatient Hospital Stay (HOSPITAL_COMMUNITY): Payer: Medicare Other

## 2019-01-13 DIAGNOSIS — M8639 Chronic multifocal osteomyelitis, multiple sites: Secondary | ICD-10-CM

## 2019-01-13 DIAGNOSIS — D649 Anemia, unspecified: Secondary | ICD-10-CM

## 2019-01-13 DIAGNOSIS — E875 Hyperkalemia: Secondary | ICD-10-CM

## 2019-01-13 LAB — COMPREHENSIVE METABOLIC PANEL
ALT: 28 U/L (ref 0–44)
AST: 33 U/L (ref 15–41)
Albumin: 1.5 g/dL — ABNORMAL LOW (ref 3.5–5.0)
Alkaline Phosphatase: 1155 U/L — ABNORMAL HIGH (ref 38–126)
Anion gap: 10 (ref 5–15)
BUN: 19 mg/dL (ref 6–20)
CO2: 16 mmol/L — ABNORMAL LOW (ref 22–32)
Calcium: 8.3 mg/dL — ABNORMAL LOW (ref 8.9–10.3)
Chloride: 108 mmol/L (ref 98–111)
Creatinine, Ser: 0.75 mg/dL (ref 0.44–1.00)
GFR calc Af Amer: >60 mL/min (ref >60)
GFR calc non Af Amer: >60 mL/min (ref >60)
Glucose, Bld: 58 mg/dL — ABNORMAL LOW (ref 70–99)
Potassium: 4.3 mmol/L (ref 3.5–5.1)
Sodium: 134 mmol/L — ABNORMAL LOW (ref 135–145)
Total Bilirubin: 1.3 mg/dL — ABNORMAL HIGH (ref 0.3–1.2)
Total Protein: 6.1 g/dL — ABNORMAL LOW (ref 6.5–8.1)

## 2019-01-13 LAB — D-DIMER, QUANTITATIVE: D-Dimer, Quant: 1.36 ug/mL-FEU — ABNORMAL HIGH (ref 0.00–0.50)

## 2019-01-13 LAB — CBG MONITORING, ED: Glucose-Capillary: 68 mg/dL — ABNORMAL LOW (ref 70–99)

## 2019-01-13 LAB — CBC
HCT: 22 % — ABNORMAL LOW (ref 36.0–46.0)
Hemoglobin: 6.1 g/dL — CL (ref 12.0–15.0)
MCH: 23.6 pg — ABNORMAL LOW (ref 26.0–34.0)
MCHC: 27.7 g/dL — ABNORMAL LOW (ref 30.0–36.0)
MCV: 84.9 fL (ref 80.0–100.0)
Platelets: 516 10*3/uL — ABNORMAL HIGH (ref 150–400)
RBC: 2.59 MIL/uL — ABNORMAL LOW (ref 3.87–5.11)
RDW: 23.3 % — ABNORMAL HIGH (ref 11.5–15.5)
WBC: 15.5 10*3/uL — ABNORMAL HIGH (ref 4.0–10.5)
nRBC: 0 % (ref 0.0–0.2)

## 2019-01-13 LAB — PATHOLOGIST SMEAR REVIEW

## 2019-01-13 LAB — PREPARE RBC (CROSSMATCH)

## 2019-01-13 LAB — MAGNESIUM: Magnesium: 1.7 mg/dL (ref 1.7–2.4)

## 2019-01-13 MED ORDER — IOHEXOL 300 MG/ML  SOLN
50.0000 mL | Freq: Once | INTRAMUSCULAR | Status: AC | PRN
  Administered 2019-01-13: 50 mL

## 2019-01-13 MED ORDER — OXYCODONE HCL 5 MG PO TABS
5.0000 mg | ORAL_TABLET | Freq: Three times a day (TID) | ORAL | Status: DC | PRN
  Administered 2019-01-13 – 2019-01-18 (×7): 5 mg via ORAL
  Filled 2019-01-13 (×8): qty 1

## 2019-01-13 MED ORDER — GABAPENTIN 300 MG PO CAPS
300.0000 mg | ORAL_CAPSULE | Freq: Three times a day (TID) | ORAL | Status: DC
  Administered 2019-01-13 – 2019-01-18 (×15): 300 mg via ORAL
  Filled 2019-01-13 (×15): qty 1

## 2019-01-13 MED ORDER — LIDOCAINE HCL (PF) 1 % IJ SOLN
INTRAMUSCULAR | Status: AC | PRN
  Administered 2019-01-13: 5 mL

## 2019-01-13 MED ORDER — SODIUM CHLORIDE 0.9% IV SOLUTION
Freq: Once | INTRAVENOUS | Status: AC
  Administered 2019-01-13: 23:00:00 500 mL via INTRAVENOUS

## 2019-01-13 MED ORDER — LIDOCAINE HCL 1 % IJ SOLN
INTRAMUSCULAR | Status: AC
  Filled 2019-01-13: qty 20

## 2019-01-13 MED ORDER — SODIUM POLYSTYRENE SULFONATE 15 GM/60ML PO SUSP
15.0000 g | Freq: Once | ORAL | Status: AC
  Administered 2019-01-13: 03:00:00 15 g
  Filled 2019-01-13: qty 60

## 2019-01-13 MED ORDER — HEPARIN SOD (PORK) LOCK FLUSH 100 UNIT/ML IV SOLN
INTRAVENOUS | Status: AC
  Filled 2019-01-13: qty 5

## 2019-01-13 MED ORDER — FENTANYL CITRATE (PF) 100 MCG/2ML IJ SOLN
25.0000 ug | Freq: Once | INTRAMUSCULAR | Status: DC
  Filled 2019-01-13: qty 2

## 2019-01-13 NOTE — Progress Notes (Signed)
Due to + COVID results, pt placed on Ventimask 8L 40% with SATS 98-100% to decrease aerosolized particles in pt's room.

## 2019-01-13 NOTE — Consult Note (Signed)
Urology Consult Note   Requesting Attending Physician:  Harold Hedge, MD Service Providing Consult: Urology   Reason for Consult:  SPT evaluation  HPI: Angela Page is seen in consultation for reasons noted above at the request of Harold Hedge, MD for evaluation of SPT mispositioned.  This is a 48 y.o. female with significant PMHx including SCI w quadriplegia, neurogenic bladder, tracheostomy, CHF, COPD, and new diagnosis of COVID after being brought to ED for lethargy.   Urology was consulted given CT a/p with concern for SPT malpositioned with possible posterior bladder wall injury. Clinically she reports her SPT has been draining well. Last changed mid Nov 2020 (~4-6weeks). No precipitable SP pain or autonomic dysreflexia episodes. No leaking around SPT.    Past Medical History: Past Medical History:  Diagnosis Date  . Acute on chronic respiratory failure with hypoxia ([place])   . Anasarca 06/10/2016  . Asthma   . At high risk for severe sepsis   . Cocaine use 10/08/2013  . Decubitus ulcer of buttock, unstageable ([place])   . Depression   . Diabetes mellitus without complication ([place])   . GERD (gastroesophageal reflux disease)   . HCAP (healthcare-associated pneumonia) 06/09/2016  . Hepatitis    hx of hepatits frm mono   . Kidney stone   . Lobar pneumonia ([place])   . Overdose of opiate or related narcotic ([place]) 09/02/2014  . Pacemaker   . Pleurisy   . Polysubstance dependence including opioid type drug, episodic abuse ([place]) 10/27/2013  . Protein calorie malnutrition ([place]) 10/31/2015  . Quadriparesis ([place])   . Quadriplegia and quadriparesis ([place])   . Quadriplegia, C5-C7 incomplete ([place]) 10/08/2013  . Stage IV pressure ulcer of sacral region ([place]) 10/31/2015  . Tracheostomy status [facility])     Past Surgical History:  Past Surgical History:  Procedure Laterality Date  . APPENDECTOMY    . BACK SURGERY  10/08/2013   fusion of spine, cervical region  . CARDIAC SURGERY    .  CYSTOSCOPY W/ URETERAL STENT PLACEMENT Right 03/09/2017   Procedure: CYSTOSCOPY [name] STENT PLACEMENT;  Surgeon: Irine Seal, MD;  Location: [facility];  Service: Urology;  Laterality: Right;  . CYSTOSCOPY W/ URETERAL STENT REMOVAL Right 07/16/2017   Procedure: CYSTOSCOPY WITH STENT REMOVAL;  Surgeon: Franchot Gallo, MD;  Location: [facility];  Service: Urology;  Laterality: Right;  . CYSTOSCOPY/URETEROSCOPY/HOLMIUM LASER/STENT PLACEMENT Right 07/16/2017   Procedure: CYSTOSCOPY/URETEROSCOPY/HOLMIUM LASER/STENT PLACEMENT/ALSO STONE EXTRACTION;  Surgeon: Franchot Gallo, MD;  Location: [facility];  Service: Urology;  Laterality: Right;  . GASTROSTOMY  11/23/2015  . I & D EXTREMITY  10/28/2011   Procedure: IRRIGATION AND DEBRIDEMENT EXTREMITY;  Surgeon: Tennis Must, MD;  Location: [place];  Service: Orthopedics;  Laterality: Right;  Irrigation and debridement right middle finger  . INSERTION OF SUPRAPUBIC CATHETER N/A 07/28/2016   Procedure: INSERTION OF SUPRAPUBIC CATHETER;  Surgeon: Franchot Gallo, MD;  Location: [facility];  Service: Urology;  Laterality: N/A;  . IR GENERIC HISTORICAL  11/23/2015   IR GASTROSTOMY TUBE MOD SED 11/23/2015 [facility]  . IR PATIENT EVAL TECH 0-60 MINS  12/18/2017  . IR REPLACE G-TUBE SIMPLE WO FLUORO  12/19/2017  . LITHOTRIPSY  08/2017  . TRACHEOSTOMY      Medication: Current Facility-Administered Medications  Medication Dose Route Frequency Provider Last Rate Last Admin  . ceFEPIme (MAXIPIME) 2 g in sodium chloride 0.9 % 100 mL IVPB  2 g Intravenous Q8H Duanne Limerick, RPH   Stopped at 01/13/19  1235  . dexamethasone (DECADRON) injection 6 mg  6 mg Intravenous Q24H Tu, Ching T, DO   6 mg at 12/22/2018 2350  . enoxaparin (LOVENOX) injection 40 mg  40 mg Subcutaneous Q24H Tu, Ching T, DO   40 mg at 01/07/2019 2245  . remdesivir 100 mg in sodium chloride 0.9 % 100 mL IVPB  100 mg Intravenous Daily Duanne Limerick, RPH 200 mL/hr at 01/13/19 1239 100 mg at  01/13/19 1239  . vancomycin (VANCOCIN) IVPB 1000 mg/200 mL premix  1,000 mg Intravenous Q24H Duanne Limerick, [place] at 01/15/2019 1956   Current Outpatient Medications  Medication Sig Dispense Refill  . acetaminophen (TYLENOL) 500 MG tablet Take 1 tablet (500 mg total) by mouth every 8 (eight) hours as needed for mild pain. 2 tablet 0  . Amino Acids-Protein Hydrolys (FEEDING SUPPLEMENT, PRO-STAT SUGAR FREE 64,) LIQD Place 30 mLs into feeding tube 3 (three) times daily. (Patient not taking: Reported on 10/21/2018) 887 mL 0  . baclofen (LIORESAL) 5 mg TABS tablet Take 5 mg by mouth 3 (three) times daily.    . bisacodyl (DULCOLAX) 10 MG suppository Place 1 suppository (10 mg total) rectally daily as needed for mild constipation. 12 suppository 0  . buPROPion (WELLBUTRIN) 75 MG tablet Take 75 mg by mouth 2 (two) times daily. Via peg tube    . ciprofloxacin (CIPRO) 500 MG tablet Take 500 mg by mouth 2 (two) times daily. Via peg tube    . collagenase (SANTYL) ointment Apply 1 application topically daily. L lateral ankle    . docusate (COLACE) 50 MG/5ML liquid Place 10 mLs (100 mg total) into feeding tube daily. (Patient not taking: Reported on 10/21/2018) 100 mL 0  . docusate sodium (COLACE) 100 MG capsule Take 100 mg by mouth 2 (two) times daily.    . Dulaglutide (TRULICITY) 1.5 IW/9.7LG SOPN Inject 1.5 mg into the skin every Friday.    . DULoxetine (CYMBALTA) 30 MG capsule Take 1 capsule (30 mg total) by mouth 2 (two) times daily. (Patient not taking: Reported on 10/21/2018) 30 capsule 3  . DULoxetine (CYMBALTA) 60 MG capsule Take 60 mg by mouth daily.    . ferrous sulfate 300 (60 Fe) MG/5ML syrup Place 3.7 mLs (220 mg total) into feeding tube daily with breakfast. (Patient not taking: Reported on 08/10/2018) 150 mL 0  . ferrous sulfate 325 (65 FE) MG tablet Take 325 mg by mouth daily with breakfast.    . gabapentin (NEURONTIN) 300 MG capsule Take 300 mg by mouth 3 (three) times daily.    . insulin  aspart (NOVOLOG) 100 UNIT/ML injection Inject 10 Units into the skin every 4 (four) hours. For glucose 150 to 200 use 1 units, for glucose 201-250 use 2 units, for 251 to 300 use 4 units, for 301 to 350 use 6 units, for 351 to 400 use 8 units, for 401 or greater use 10 units. (Patient taking differently: Inject 0-8 Units into the skin See admin instructions. Check FSBS every 4 hours. Give insulin according to sliding scale:  0-70 call MD 71-149 0 units 150-200 1 unit 201-250 2 units 251-300 4 units 301-350 6 units 351-400 8 units >400 call MD) 10 mL 0  . Insulin Glargine (BASAGLAR KWIKPEN) 100 UNIT/ML SOPN Inject 0.05 mLs (5 Units total) into the skin at bedtime. 1 pen 0  . lactulose (CHRONULAC) 10 GM/15ML solution Take 20 g by mouth daily.    Marland Kitchen lip balm (CARMEX) ointment Apply topically as  needed for lip care. (Patient not taking: Reported on 10/21/2018) 7 g 0  . loratadine (CLARITIN) 10 MG tablet Take 10 mg by mouth daily.     . midodrine (PROAMATINE) 10 MG tablet Take 10 mg by mouth every 8 (eight) hours as needed (hypotension). SBP 100 or less    . Multiple Vitamin (MULTIVITAMIN WITH MINERALS) TABS tablet Take 1 tablet by mouth daily. Via peg tube    . Nutritional Supplements (FEEDING SUPPLEMENT, GLUCERNA 1.2 CAL,) LIQD Place 1,000 mLs into feeding tube daily. (Patient taking differently: Place 1,000 mLs into feeding tube See admin instructions. 24m/h from 10pm-10am)    . Nutritional Supplements (FEEDING SUPPLEMENT, GLUCERNA 1.2 CAL,) LIQD Place 400 mLs into feeding tube daily. (Patient not taking: Reported on 10/21/2018)    . Nutritional Supplements (PROMOD) LIQD Take 30 mLs by mouth 3 (three) times daily.    . ondansetron (ZOFRAN) 4 MG tablet Take 4 mg by mouth every 6 (six) hours as needed for nausea or vomiting.    .Marland Kitchenoxycodone (OXY-IR) 5 MG capsule Take 1 capsule (5 mg total) by mouth every 8 (eight) hours as needed for pain. 2 capsule 0  . OXYGEN Inhale 3 L into the lungs continuous.     . pantoprazole (PROTONIX) 40 MG tablet Take 40 mg by mouth 2 (two) times daily.    . polyethylene glycol (MIRALAX / GLYCOLAX) packet Take 17 g by mouth daily. Hold if diarrhea. (Patient taking differently: Take 17 g by mouth 2 (two) times daily. Hold if diarrhea.) 14 each 0  . potassium chloride 20 MEQ/15ML (10%) SOLN Take 40 mEq by mouth 2 (two) times daily.    . simethicone (MYLICON) 80 MG chewable tablet Chew 80 mg by mouth every 6 (six) hours as needed for flatulence. Via peg tube    . tiotropium (SPIRIVA) 18 MCG inhalation capsule Place 18 mcg into inhaler and inhale daily.    .Marland Kitchenzolpidem (AMBIEN) 5 MG tablet Take 0.5 tablets (2.5 mg total) by mouth at bedtime. 1 tablet 0    Allergies: Allergies  Allergen Reactions  . Penicillins Hives    Has patient had a PCN reaction causing immediate rash, facial/tongue/throat swelling, SOB or lightheadedness with hypotension: Yes Has patient had a PCN reaction causing severe rash involving mucus membranes or skin necrosis: Yes Has patient had a PCN reaction that required hospitalization No Has patient had a PCN reaction occurring within the last 10 years: No-childhood allergy If all of the above answers are "NO", then may proceed with Cephalosporin  Tolerated cephalosporins in past  . Erythromycin Nausea And Vomiting  . Nitrofurantoin Monohyd Macro Nausea And Vomiting  . Oxybutynin Other (See Comments)    Dries mouth out     Social History: Social History   Tobacco Use  . Smoking status: Former Smoker    Packs/day: 1.00    Years: 25.00    Pack years: 25.00    Types: Cigarettes  . Smokeless tobacco: Never Used  Substance Use Topics  . Alcohol use: Never  . Drug use: No    Family History Family History  Problem Relation Age of Onset  . Hypertension Maternal Uncle   . Kidney failure Maternal Uncle   . Colon cancer Mother   . Lung cancer Father   . Hypertension Brother   . Kidney failure Maternal Aunt   . Kidney failure Maternal  Uncle   . Colon cancer Maternal Grandfather   . Esophageal cancer Neg Hx     Review  of Systems 10 systems were reviewed and are negative except as noted specifically in the HPI.  Objective   Vital signs in last 24 hours: BP (!) 86/56   Pulse 91   Temp 98.1 F (36.7 C) (Oral)   Resp 17   Wt 46.5 kg   LMP 05/27/2016 (Within Days) Comment: Irregular periods since October 2015.  SpO2 100%   BMI 18.75 kg/m   Physical Exam General: NAD, A&O, resting, answers questions appropriately. Severely contracted.  HEENT: Wears glasses Pulmonary: Normal work of breathing with trachesostomy in place Cardiovascular: HDS Abdomen: Soft, NTTP, nondistended. Very thin.  GU: 18Fr SPT in place. Removed and replaced with new 18Fr SPT. Draining turbid yellow urine.  Extremities: warm and well perfused. Contracted.    Most Recent Labs: Lab Results  Component Value Date   WBC 9.8 01/13/2019   HGB 7.1 (L) 01/04/2019   HCT 25.5 (L) 01/05/2019   PLT 678 (H) 12/30/2018    Lab Results  Component Value Date   NA 131 (L) 12/30/2018   K 5.8 (H) 01/18/2019   CL 103 01/02/2019   CO2 18 (L) 01/11/2019   BUN 19 01/18/2019   CREATININE 0.70 12/31/2018   CALCIUM 8.7 (L) 12/26/2018   MG 1.8 08/21/2018   PHOS 4.3 08/21/2018    Lab Results  Component Value Date   INR 1.0 06/22/2018   APTT 28 03/10/2017     Urine Culture: '@[email or web address] (laburin,org,[id number])@   IMAGING: DG Tibia/Fibula Left  Result Date: 01/03/2019 CLINICAL DATA:  Altered mental status, lethargy, sleeping, infection EXAM: LEFT TIBIA AND FIBULA - 2 VIEW COMPARISON:  Portable exam 1723 hours without priors for comparison FINDINGS: Diffuse osseous demineralization. Knee and ankle joint alignments normal. No acute fracture or dislocation. Soft tissue irregularity at the lateral aspect of lower LEFT leg mid to distally. Associated cortical destruction of the lateral distal fibula consistent with osteomyelitis. IMPRESSION: Osseous  demineralization with cortical destruction of the lateral margin of the mid to distal LEFT fibula consistent with osteomyelitis. Findings called to Dr. Alvino Chapel on 01/11/2019 at 1753 hours. Electronically Signed   By: Lavonia Dana M.D.   On: 01/17/2019 17:54   CT ABDOMEN PELVIS W CONTRAST  Result Date: 01/19/2019 CLINICAL DATA:  Osteomyelitis elevated alk-phos EXAM: CT ABDOMEN AND PELVIS WITH CONTRAST TECHNIQUE: Multidetector CT imaging of the abdomen and pelvis was performed using the standard protocol following bolus administration of intravenous contrast. CONTRAST:  84m OMNIPAQUE IOHEXOL 300 MG/ML  SOLN COMPARISON:  October 21, 2018 FINDINGS: Lower chest: The visualized heart size within normal limits. No pericardial fluid/thickening. Pacemaker lead tips are seen. No hiatal hernia. There is a small left pleural effusion. Patchy/streaky airspace opacities seen at the right lung base. Hepatobiliary: There is heterogeneous parenchymal enhancement throughout the liver. Mild intrahepatic biliary ductal dilatation seen. There is hyperenhancement of the gallbladder wall with layering gallstones and significant surrounding pericholecystic fluid and fat stranding changes extending perihepatic and into the right pericolonic gutter. Pancreas: Unremarkable. No pancreatic ductal dilatation or surrounding inflammatory changes. Spleen: Normal in size without focal abnormality. Adrenals/Urinary Tract: Both adrenal glands appear normal. There is a 7 mm calculus seen within the upper pole of the left kidney as on prior exam. Mild bilateral perinephric stranding is seen. No hydronephrosis is noted. There is diffuse bladder wall thickening. There is a suprapubic catheter which appears to protrude through the posterior bladder wall. Within the deep pelvis adjacent to the suprapubic catheter there is a small amount of fluid within the deep pelvis.  Stomach/Bowel: There is diffuse wall thickening of the distal stomach/pylorus with  surrounding mesenteric fat stranding changes seen. A percutaneous gastrotomy tube is seen projecting over the mid body of the stomach. The small bowel and colon are unremarkable. The Vascular/Lymphatic: Scattered aortic atherosclerotic calcifications are seen without aneurysmal dilatation. Reproductive: The uterus and adnexa are unremarkable. Other: There is diffuse anasarca. Along the left rectus sheath there is interval decreased appearance to the probable hematoma measuring 4.3 cm. Musculoskeletal: There is interval worsening in the ulceration over the posterior left greater trochanter with further fragmentation of the greater trochanter cortical irregularity involving the entire femoroacetabular joint. There is subcutaneous emphysema and non loculated fluid seen overlying the greater trochanter. There is also further ulceration of the posterior right ischial tuberosity with a small focus of subcutaneous emphysema. There is cortical irregularity and periosteal reaction with erosion at the posterior ischial tuberosity. There is been a prior resection of the distal sacrum and coccyx. Superficial ulceration seen at the distal sacral element with further sclerosis and cortical irregularity at the for resection margins. IMPRESSION: 1. Small left pleural effusion and patchy atelectasis at the right lung base. 2. Findings which could be suggestive of acute cholecystitis with hyperenhancement of the gallbladder wall, pericholecystic fluid and pericholecystic inflammatory changes. Inflammatory changes extend around the liver and into the right pericolic gutter. If further evaluation is required, would recommend right upper quadrant ultrasound. 3. Diffuse gastric wall thickening of the distal stomach/pylorus which could be due to mild gastritis. 4. Suprapubic catheter which appears to protrude through the posterior bladder wall with fluid in the deep pelvis which could represent urinary leak or ascites. 5. Findings  suggestive of chronic cystitis. 6. Further worsening in the large area of ulceration over the posterior left greater trochanter with progression of chronic osteomyelitis and fragmentation of the greater trochanter and left hip. There is adjacent non loculated fluid in subcutaneous emphysema which could represent phlegmon/early abscess. 7. Progression of the ulceration over the right ischial tuberosity with chronic osteomyelitis. 8. Focal sacral decubitus ulcer with sclerosis at the resection margin of the distal sacrum, likely progression of chronic osteomyelitis. 9. Diffuse anasarca 10. Resolving left rectus sheath 1. These results were called by telephone at the time of interpretation on 01/17/2019 at 10:55 pm to provider Davonna Belling , who verbally acknowledged these results. Electronically Signed   By: Prudencio Pair M.D.   On: 12/31/2018 23:00   DG Chest Portable 1 View  Result Date: 01/08/2019 CLINICAL DATA:  Lethargy, sleeping, multiple source EXAM: PORTABLE CHEST 1 VIEW COMPARISON:  Portable exam 1716 hours compared to 08/19/2018 FINDINGS: Tracheostomy tube projects over tracheal air column. LEFT subclavian sequential transvenous pacemaker leads project over RIGHT atrium and RIGHT ventricle. Upper normal heart size. Mediastinal contours and pulmonary vascularity normal. Lungs grossly clear. No infiltrate, pleural effusion or pneumothorax. Prior cervical spine fusion. IMPRESSION: No acute abnormalities. Electronically Signed   By: Lavonia Dana M.D.   On: 01/02/2019 17:55    Procedure: Bedside SPT exchange.  20 cc of sterile water were aspirated out of her existing suprapubic catheter and the catheter was removed without resistance.  Her suprapubic tube site was well-established and prepped and draped in standard sterile fashion.  New 18 French two-way catheter was inserted with immediate return of turbid yellow urine. Gentle movement confirmed placement within bladder. 10 cc of sterile water were  placed in the balloon and the catheter was attached to a drainage bag. Catheter was again manipulated and felt to be  within bladder lumen. Straightforward exchange ------  Assessment:  48 y.o. female with significant PMHx including urologic history of spinal cord injury resulting in neurogenic bladder managed with SPT (2018), nephrolithiasis, who is currently admitted with lethergy.   Urology was consulted for SPT and bladder evaluation. CT a/p with 01/16/2019 with SPT possibly violating posterior bladder wall. Clinically it has been draining well although was due for exchange. UA with either colonization v infection, follow up culture. SPT was exchanged at bedside and clincally confirmed to be in good position on 01/13/19. Will evaluate bladder with CT Cystogram. This will characterize bladder wall; if extraperitoneal injury would manage with SPT but would also inform in setting of decubitus ulcer and cellulitis. Low suspicion for intraperitoneal injury given benign abdominal exam.   Recommendations: - SPT replaced by urology on 01/13/2019. Continue to gravity drainage - Return to normal interval exchanges at facility.  - CT Cystogram to evaluate bladder.    Thank you for this consult.

## 2019-01-13 NOTE — ED Notes (Signed)
Patient transported to CT 

## 2019-01-13 NOTE — Progress Notes (Signed)
HME added to pt's trach.

## 2019-01-13 NOTE — Progress Notes (Addendum)
PROGRESS NOTE    Angela Page    Code Status: Full Code  WUJ:[id number] DOB: 29-Nov-1970 DOA: 12/22/2018  PCP: Default, Provider, MD    Hospital Summary   Angela Page is a 48 y.o. female with medical history significant of Hx of quadriplegia following spinal cord injury with trach and PEG dependency chronically on 3L, chronic diastolic CHF, neurogenic orthostatic hypotension, COPD who presented from facility with concerns of lethargy. Patient is able to nod to yes or no to questions and attempts to answer questions although difficult to hear due to her tracheostomy.  Reportedly she was sent over by staff due to lethargy and concerns for sepsis.  She was found to be hypoxic down to 80% on 4 L and had to be placed on nonrebreather by EMS.    CT abdomen and pelvis showing suprapubic catheter appearing to protrude through posterior bladder wall with fluid in the pelvis.    Urology was consulted, SPT replaced by urology on 12/24, continue gravity drainage, return to normal interval exchanges at facility and CT cystogram to evaluate bladder.   Left tibia/fibula x-ray showing osseous demineralization concerning for osteomyelitis and orthopedic surgery was consulted who recommended ID consult, wound care and conservative management and did not recommend surgery at this time.    A & P   Active Problems:   Quadriplegia (HCC)   Decubitus ulcer of sacral region, stage 4 (HCC)   Anemia, iron deficiency   Tracheostomy status (HCC)   PEG (percutaneous endoscopic gastrostomy) status ([place])   Complicated UTI (urinary tract infection)   Sepsis (HCC)   Hyperkalemia  Acute on chronic hypoxia in the setting of COVID infection with tracheostomy Baseline on 3 L-now 9L Continue IV Decadron and remdesivir  Cellulitis of the back  Continue vancomycin and cefepime per ID  Osteomyelitis of the left fibula, left greater trochanter, distal sacrum and left hip Orthopedic surgery consulted:  Continue with medical management at this time with no surgical indication.  Recommended wound care and ID consult -ID consulted recommended continuing Vanco/cefepime as well as palliative care consult due to multiple comorbidities and poor prognosis -Palliative care consulted  UTI with suprapubic catheter  Continue vancomycin and cefepime CT abdomen pelvis final report pending but call back to ER shows possible erosion of the suprapubic catheter through the bladder Will need to consult urology  Stage 4 decubitus ulcer Palliative care and wound care consult  Hypotension due to multiple infectious source MAP 69, continue to monitor  Hyperkalemia Resolved with Kayexalate x1  Acute on Chronic anemia Hemoglobin of 7.1->6.1.   No obvious bleed  Transfuse 1 unit PRBCs  Quadriplegic due to spinal cord injury Poor prognosis with ongoing infections Palliative care consulted  PEG tube dependent -Consult dietitian  Elevated LFTs asymptomatic -Likely from Covid, monitor   DVT prophylaxis: Hold Lovenox with hemoglobin 6.1 concern for possible bleed.  SCDs Diet: N.p.o. except sips with meds pending SLP eval Family Communication: Patient fight at bedside Disposition Plan: Pending ongoing clinical work-up  Consultants  Orthopedic surgery Urology Infectious disease Palliative care  Procedures  Blood transfusion  Antibiotics   Anti-infectives (From admission, onward)   Start     Dose/Rate Route Frequency Ordered Stop   01/13/19 1000  remdesivir 100 mg in sodium chloride 0.9 % 100 mL IVPB     100 mg 200 mL/hr over 30 Minutes Intravenous Daily 01/01/2019 2243 01/17/19 0959   01/13/19 0200  ceFEPIme (MAXIPIME) 2 g in sodium chloride 0.9 % 100 mL  IVPB     2 g 200 mL/hr over 30 Minutes Intravenous Every 8 hours 12/23/2018 1831     12/27/2018 2245  remdesivir 200 mg in sodium chloride 0.9% 250 mL IVPB     200 mg 580 mL/hr over 30 Minutes Intravenous Once 01/11/2019 2243 01/13/19  0210   12/29/2018 1830  vancomycin (VANCOCIN) IVPB 1000 mg/200 mL premix     1,000 mg 200 mL/hr over 60 Minutes Intravenous Every 24 hours 01/09/2019 1831     01/20/2019 1730  vancomycin (VANCOCIN) IVPB 1000 mg/200 mL premix  Status:  Discontinued     1,000 mg 200 mL/hr over 60 Minutes Intravenous  Once 01/07/2019 1717 01/05/2019 1831   12/27/2018 1715  ceFEPIme (MAXIPIME) 2 g in sodium chloride 0.9 % 100 mL IVPB     2 g 200 mL/hr over 30 Minutes Intravenous  Once 12/30/2018 1713 01/07/2019 1837           Subjective   Patient evaluated at bedside in the ED in no acute distress resting comfortably.  Though she has difficulty with fully verbalizing due to her tracheostomy she is able to get across her point that she is feeling well/improved from admission.  On 9 L O2 at bedside denies any chest pain, shortness of breath, nausea, vomiting, pain at any pain/osteomyelitis site.  Denied any increased sputum production  Objective   Vitals:   01/13/19 1330 01/13/19 1400 01/13/19 1430 01/13/19 1651  BP: (!) 77/46 (!) 93/56 (!) 91/56   Pulse: 91  91 98  Resp: '16 19 16 18  '$ Temp:      TempSrc:      SpO2: 100%  100% 91%  Weight:        Intake/Output Summary (Last 24 hours) at 01/13/2019 2039 Last data filed at 01/13/2019 1436 Gross per 24 hour  Intake 5672.98 ml  Output --  Net 5672.98 ml   Filed Weights   01/19/2019 1704  Weight: 46.5 kg    Examination:  Physical Exam Vitals and nursing note reviewed.  Constitutional:      Comments: Chronically ill with contractures  HENT:     Head: Normocephalic.  Neck:     Comments: Tracheostomy Cardiovascular:     Comments: Sinus tach on telemetry, no disposable stethoscope at bedside Pulmonary:     Effort: Pulmonary effort is normal. No respiratory distress.  Musculoskeletal:     Comments: Contractures bilateral upper and lower extremities Left lower extremity with wound at distal leg with bandage across  Neurological:     Mental Status: She is  alert.     Comments: Seems at baseline  Psychiatric:        Mood and Affect: Mood normal.        Behavior: Behavior normal.        Thought Content: Thought content normal.        Judgment: Judgment normal.     Data Reviewed: I have personally reviewed following labs and imaging studies  CBC: Recent Labs  Lab 01/07/2019 1722 01/13/19 1900  WBC 9.8 15.5*  NEUTROABS 6.5  --   HGB 7.1* 6.1*  HCT 25.5* 22.0*  MCV 85.0 84.9  PLT 678* 264*   Basic Metabolic Panel: Recent Labs  Lab 01/05/2019 1722 01/13/19 1900  NA 131* 134*  K 5.8* 4.3  CL 103 108  CO2 18* 16*  GLUCOSE 101* 58*  BUN 19 19  CREATININE 0.70 0.75  CALCIUM 8.7* 8.3*  MG  --  1.7   GFR:  Estimated Creatinine Clearance: 63.1 mL/min (by C-G formula based on SCr of 0.75 mg/dL). Liver Function Tests: Recent Labs  Lab 12/24/2018 1722 01/13/19 1900  AST 51* 33  ALT 38 28  ALKPHOS 1,621* 1,155*  BILITOT 1.2 1.3*  PROT 6.9 6.1*  ALBUMIN 1.7* 1.5*   No results for input(s): LIPASE, AMYLASE in the last 168 hours. No results for input(s): AMMONIA in the last 168 hours. Coagulation Profile: No results for input(s): INR, PROTIME in the last 168 hours. Cardiac Enzymes: No results for input(s): CKTOTAL, CKMB, CKMBINDEX, TROPONINI in the last 168 hours. BNP (last 3 results) No results for input(s): PROBNP in the last 8760 hours. HbA1C: No results for input(s): HGBA1C in the last 72 hours. CBG: No results for input(s): GLUCAP in the last 168 hours. Lipid Profile: No results for input(s): CHOL, HDL, LDLCALC, TRIG, CHOLHDL, LDLDIRECT in the last 72 hours. Thyroid Function Tests: No results for input(s): TSH, T4TOTAL, FREET4, T3FREE, THYROIDAB in the last 72 hours. Anemia Panel: No results for input(s): VITAMINB12, FOLATE, FERRITIN, TIBC, IRON, RETICCTPCT in the last 72 hours. Sepsis Labs: Recent Labs  Lab 12/31/2018 1730  LATICACIDVEN 0.8    Recent Results (from the past 240 hour(s))  Culture, blood (routine x  2)     Status: None (Preliminary result)   Collection Time: 01/15/2019  5:35 PM   Specimen: BLOOD LEFT HAND  Result Value Ref Range Status   Specimen Description BLOOD LEFT HAND  Final   Special Requests   Final    BOTTLES DRAWN AEROBIC AND ANAEROBIC Blood Culture adequate volume   Culture   Final    NO GROWTH < 12 HOURS Performed at [facility], [place] [address] [place], [place] [address]    Report Status PENDING  Incomplete  Culture, blood (Routine X 2) w Reflex to ID Panel     Status: None (Preliminary result)   Collection Time: 12/25/2018  6:04 PM   Specimen: BLOOD RIGHT FOREARM  Result Value Ref Range Status   Specimen Description BLOOD RIGHT FOREARM  Final   Special Requests   Final    BOTTLES DRAWN AEROBIC AND ANAEROBIC Blood Culture adequate volume   Culture   Final    NO GROWTH < 12 HOURS Performed at [facility], [place] [address], [place], [place] [address]    Report Status PENDING  Incomplete  Respiratory Panel by RT PCR (Flu A&B, Covid) - Nasopharyngeal Swab     Status: Abnormal   Collection Time: 12/22/2018  6:26 PM   Specimen: Nasopharyngeal Swab  Result Value Ref Range Status   SARS Coronavirus 2 by RT PCR POSITIVE (A) NEGATIVE Final    Comment: RESULT CALLED TO, READ BACK BY AND VERIFIED WITH: P PEICKERT RN 01/04/2019 1948 JDW (NOTE) SARS-CoV-2 target nucleic acids are DETECTED. SARS-CoV-2 RNA is generally detectable in upper respiratory specimens  during the acute phase of infection. Positive results are indicative of the presence of the identified virus, but do not rule out bacterial infection or co-infection with other pathogens not detected by the test. Clinical correlation with patient history and other diagnostic information is necessary to determine patient infection status. The expected result is Negative. Fact Sheet for Patients:  PinkCheek.be Fact Sheet for Healthcare  Providers: GravelBags.it This test is not yet approved or cleared by the [place] FDA and  has been authorized for detection and/or diagnosis of SARS-CoV-2 by FDA under an Emergency Use Authorization (EUA).  This EUA will remain in effect (  meaning this test can be used) for t he duration of  the COVID-19 declaration under Section 564(b)(1) of the Act, 21 U.S.C. section 360bbb-3(b)(1), unless the authorization is terminated or revoked sooner.    Influenza A by PCR NEGATIVE NEGATIVE Final   Influenza B by PCR NEGATIVE NEGATIVE Final    Comment: (NOTE) The Xpert Xpress SARS-CoV-2/FLU/RSV assay is intended as an aid in  the diagnosis of influenza from Nasopharyngeal swab specimens and  should not be used as a sole basis for treatment. Nasal washings and  aspirates are unacceptable for Xpert Xpress SARS-CoV-2/FLU/RSV  testing. Fact Sheet for Patients: PinkCheek.be Fact Sheet for Healthcare Providers: GravelBags.it This test is not yet approved or cleared by the [place] FDA and  has been authorized for detection and/or diagnosis of SARS-CoV-2 by  FDA under an Emergency Use Authorization (EUA). This EUA will remain  in effect (meaning this test can be used) for the duration of the  Covid-19 declaration under Section 564(b)(1) of the Act, 21  U.S.C. section 360bbb-3(b)(1), unless the authorization is  terminated or revoked. Performed at [facility], [place] [address] [place], [place] [address]          Radiology Studies: DG Tibia/Fibula Left  Result Date: 01/18/2019 CLINICAL DATA:  Altered mental status, lethargy, sleeping, infection EXAM: LEFT TIBIA AND FIBULA - 2 VIEW COMPARISON:  Portable exam 1723 hours without priors for comparison FINDINGS: Diffuse osseous demineralization. Knee and ankle joint alignments normal. No acute fracture or dislocation. Soft tissue irregularity at the  lateral aspect of lower LEFT leg mid to distally. Associated cortical destruction of the lateral distal fibula consistent with osteomyelitis. IMPRESSION: Osseous demineralization with cortical destruction of the lateral margin of the mid to distal LEFT fibula consistent with osteomyelitis. Findings called to Dr. Alvino Chapel on 12/27/2018 at 1753 hours. Electronically Signed   By: Lavonia Dana M.D.   On: 01/06/2019 17:54   CT PELVIS W CONTRAST  Result Date: 01/13/2019 CLINICAL DATA:  Question bladder perforation, abnormal CT with suprapubic catheter questionably extending through bladder wall, history of quadriplegia, diabetes mellitus EXAM: CT PELVIS WITH CONTRAST TECHNIQUE: Multidetector CT imaging of the pelvis was performed using the standard protocol following the bolus administration of intravenous contrast. Dilute contrast was instilled retrograde into the urinary bladder prior to imaging. CONTRAST:  40m OMNIPAQUE IOHEXOL 300 MG/ML SOLN mixed with 250 cc of sterile saline, volume instilled 75 mL. COMPARISON:  CT abdomen and pelvis 01/16/2019 FINDINGS: Urinary Tract: Instilled contrast material is identified within the bladder lumen. Reflux of contrast into the distal ureters bilaterally. Mild bladder wall thickening. Mildly irregular posterior wall the bladder which may be related to debris, clot, or wall irregularity. No extravasation of contrast into the extraperitoneal spaces surrounding the bladder nor into the peritoneal cavity. Bowel: Gas, fluid, and stool throughout colon. Bowel loops grossly unremarkable. Vascular/Lymphatic: Atherosclerotic calcifications. No adenopathy. 11 mm LEFT inguinal node. Reproductive:  Atrophic uterus. Other: Small LEFT rectus sheath collection question hematoma unchanged, 3.8 x 1.7 cm. Free fluid in pelvis. No free air. Musculoskeletal: Chronic osseous demineralization. Bone destruction at the sacrum, BILATERAL ischia, and proximal RIGHT femur from decubitus ulcers and  suspected osteomyelitis. IMPRESSION: Mild bladder wall thickening without evidence of bladder perforation/contrast extravasation. Vesicoureteral reflux into distal ureters bilaterally. Slightly irregular posterior wall of bladder lumen, could be related to bladder wall thickening or dependent debris. Small amount of nonspecific free pelvic fluid. Small LEFT rectus sheath hematoma unchanged. Osseous destruction from decubitus ulcers and suspected  osteomyelitis at the sacrum, BILATERAL ischia, and proximal RIGHT femur. Aortic Atherosclerosis (ICD10-I70.0). Electronically Signed   By: Lavonia Dana M.D.   On: 01/13/2019 16:51   CT ABDOMEN PELVIS W CONTRAST  Result Date: 01/18/2019 CLINICAL DATA:  Osteomyelitis elevated alk-phos EXAM: CT ABDOMEN AND PELVIS WITH CONTRAST TECHNIQUE: Multidetector CT imaging of the abdomen and pelvis was performed using the standard protocol following bolus administration of intravenous contrast. CONTRAST:  7m OMNIPAQUE IOHEXOL 300 MG/ML  SOLN COMPARISON:  October 21, 2018 FINDINGS: Lower chest: The visualized heart size within normal limits. No pericardial fluid/thickening. Pacemaker lead tips are seen. No hiatal hernia. There is a small left pleural effusion. Patchy/streaky airspace opacities seen at the right lung base. Hepatobiliary: There is heterogeneous parenchymal enhancement throughout the liver. Mild intrahepatic biliary ductal dilatation seen. There is hyperenhancement of the gallbladder wall with layering gallstones and significant surrounding pericholecystic fluid and fat stranding changes extending perihepatic and into the right pericolonic gutter. Pancreas: Unremarkable. No pancreatic ductal dilatation or surrounding inflammatory changes. Spleen: Normal in size without focal abnormality. Adrenals/Urinary Tract: Both adrenal glands appear normal. There is a 7 mm calculus seen within the upper pole of the left kidney as on prior exam. Mild bilateral perinephric stranding  is seen. No hydronephrosis is noted. There is diffuse bladder wall thickening. There is a suprapubic catheter which appears to protrude through the posterior bladder wall. Within the deep pelvis adjacent to the suprapubic catheter there is a small amount of fluid within the deep pelvis. Stomach/Bowel: There is diffuse wall thickening of the distal stomach/pylorus with surrounding mesenteric fat stranding changes seen. A percutaneous gastrotomy tube is seen projecting over the mid body of the stomach. The small bowel and colon are unremarkable. The Vascular/Lymphatic: Scattered aortic atherosclerotic calcifications are seen without aneurysmal dilatation. Reproductive: The uterus and adnexa are unremarkable. Other: There is diffuse anasarca. Along the left rectus sheath there is interval decreased appearance to the probable hematoma measuring 4.3 cm. Musculoskeletal: There is interval worsening in the ulceration over the posterior left greater trochanter with further fragmentation of the greater trochanter cortical irregularity involving the entire femoroacetabular joint. There is subcutaneous emphysema and non loculated fluid seen overlying the greater trochanter. There is also further ulceration of the posterior right ischial tuberosity with a small focus of subcutaneous emphysema. There is cortical irregularity and periosteal reaction with erosion at the posterior ischial tuberosity. There is been a prior resection of the distal sacrum and coccyx. Superficial ulceration seen at the distal sacral element with further sclerosis and cortical irregularity at the for resection margins. IMPRESSION: 1. Small left pleural effusion and patchy atelectasis at the right lung base. 2. Findings which could be suggestive of acute cholecystitis with hyperenhancement of the gallbladder wall, pericholecystic fluid and pericholecystic inflammatory changes. Inflammatory changes extend around the liver and into the right pericolic  gutter. If further evaluation is required, would recommend right upper quadrant ultrasound. 3. Diffuse gastric wall thickening of the distal stomach/pylorus which could be due to mild gastritis. 4. Suprapubic catheter which appears to protrude through the posterior bladder wall with fluid in the deep pelvis which could represent urinary leak or ascites. 5. Findings suggestive of chronic cystitis. 6. Further worsening in the large area of ulceration over the posterior left greater trochanter with progression of chronic osteomyelitis and fragmentation of the greater trochanter and left hip. There is adjacent non loculated fluid in subcutaneous emphysema which could represent phlegmon/early abscess. 7. Progression of the ulceration over the right ischial tuberosity  with chronic osteomyelitis. 8. Focal sacral decubitus ulcer with sclerosis at the resection margin of the distal sacrum, likely progression of chronic osteomyelitis. 9. Diffuse anasarca 10. Resolving left rectus sheath 1. These results were called by telephone at the time of interpretation on 12/30/2018 at 10:55 pm to provider Davonna Belling , who verbally acknowledged these results. Electronically Signed   By: Prudencio Pair M.D.   On: 01/02/2019 23:00   DG Chest Portable 1 View  Result Date: 01/14/2019 CLINICAL DATA:  Lethargy, sleeping, multiple source EXAM: PORTABLE CHEST 1 VIEW COMPARISON:  Portable exam 1716 hours compared to 08/19/2018 FINDINGS: Tracheostomy tube projects over tracheal air column. LEFT subclavian sequential transvenous pacemaker leads project over RIGHT atrium and RIGHT ventricle. Upper normal heart size. Mediastinal contours and pulmonary vascularity normal. Lungs grossly clear. No infiltrate, pleural effusion or pneumothorax. Prior cervical spine fusion. IMPRESSION: No acute abnormalities. Electronically Signed   By: Lavonia Dana M.D.   On: 01/15/2019 17:55   IR PICC PLACEMENT RIGHT >5 YRS INC IMG GUIDE  Result Date:  01/13/2019 CLINICAL DATA:  Osteomyelitis, needs durable venous access EXAM: PICC PLACEMENT WITH ULTRASOUND AND FLUOROSCOPY FLUOROSCOPY TIME:  0.6 minutes; 12 uGym2 DAP TECHNIQUE: After written informed consent was obtained, patient was placed in the supine position on angiographic table. Patency of the right brachial vein was confirmed with ultrasound with image documentation. An appropriate skin site was determined. Skin site was marked. Region was prepped using maximum barrier technique including cap and mask, sterile gown, sterile gloves, large sterile sheet, and Chlorhexidine as cutaneous antisepsis. The region was infiltrated locally with 1% lidocaine. Under real-time ultrasound guidance, the right brachial vein was accessed with a 21 gauge micropuncture needle; the needle tip within the vein was confirmed with ultrasound image documentation. Needle exchanged over a 018 guidewire for a peel-away sheath, through which a 5-French double-lumen power injectable PICC trimmed to 33cm was advanced, positioned with its tip near the cavoatrial junction. Spot chest radiograph confirms appropriate catheter position. Catheter was flushed per protocol and secured externally. The patient tolerated procedure well. COMPLICATIONS: COMPLICATIONS none IMPRESSION: 1. Technically successful five [place] double lumen power injectable PICC placement Electronically Signed   By: Lucrezia Europe M.D.   On: 01/13/2019 16:20        Scheduled Meds:  dexamethasone (DECADRON) injection  6 mg Intravenous Q24H   enoxaparin (LOVENOX) injection  40 mg Subcutaneous Q24H   fentaNYL (SUBLIMAZE) injection  25 mcg Intravenous Once   gabapentin  300 mg Oral TID   heparin lock flush       lidocaine       Continuous Infusions:  ceFEPime (MAXIPIME) IV Stopped (01/13/19 2023)   remdesivir 100 mg in NS 100 mL Stopped (01/13/19 1436)   vancomycin 1,000 mg (01/13/19 2023)     LOS: 1 day    Time spent: 40 minutes with over 50% of  the time coordinating the patient's care    Harold Hedge, DO [facility] Pager [phone]  If 7PM-7AM, please contact night-coverage [email or web address] Password [facility] 01/13/2019, 8:39 PM

## 2019-01-13 NOTE — ED Notes (Signed)
Checked on pt. Pt verbalized that she is ok.

## 2019-01-13 NOTE — ED Notes (Signed)
UTO AM labs, phlebotomy to attempt.

## 2019-01-13 NOTE — ED Notes (Signed)
[name] (patient's mother)  [phone]

## 2019-01-13 NOTE — Consult Note (Signed)
Date of Admission:  12/31/2018          Reason for Consult:  Osteomyelitis  Referring Provider: Dr. Neysa Bonito   Assessment:  1. COVID pneumonia 2. Chronic respiratory failure with tracheostomy 3. Quadriplegia due to spinal cord injury 4. Concerns for sepsis given hypotension 5. Chronic osteomyelitis of the left fibula 6.  ulceration over the posterior left greater trochanter with progression of chronic osteomyelitis and fragmentation of the greater trochanter and left Hip. With   adjacent non loculated fluid in subcutaneous emphysema which could represent phlegmon/early abscess.  7. Progression of the ulceration over the right ischial tuberosity with chronic osteomyelitis.  8. Focal sacral decubitus ulcer with sclerosis at the resection margin of the distal sacrum, likely progression of chronic Osteomyelitis.   Plan:  1. Remdesivir  2. Corticosteroids 3. OK to continue the vancomycin and cefepime for now pending blood culture data 4. It is completely IMPOSSIBLE to make ANY rational targeted antimicrobial recommendations absent a bone culture to guide therapy and orthopedics who UNDERSTANDABLY  DO not want to intervene at this point though if she survives this hospitalization we can give her a course of broad spectrum antibiotics that she is already on and then re-evaluate. I do not see [name] curing ANY ARE OF OSTEOMYELITIS in her body 5. I would STRONGLY recommend palliative care consult as this woman seems to be existing in an abysmally poor condition. I would personally never want myself or my family member to "live"  Like this and she seems to be someone in whom long term expensive and futile care is being practiced.   Dr. Johnnye Sima will be available for questions Dec 25th through the 28th and I will be similarly available Dec 29th through January the 1st.   Active Problems:   Quadriplegia [facility])   Decubitus ulcer of sacral region, stage 4 (HCC)   Anemia, iron deficiency  Tracheostomy status ([place])   PEG (percutaneous endoscopic gastrostomy) status ([place])   Complicated UTI (urinary tract infection)   Sepsis ([place])   Hyperkalemia   Scheduled Meds: . dexamethasone (DECADRON) injection  6 mg Intravenous Q24H  . enoxaparin (LOVENOX) injection  40 mg Subcutaneous Q24H   Continuous Infusions: . ceFEPime (MAXIPIME) IV Stopped (01/13/19 1235)  . remdesivir 100 mg in NS 100 mL Stopped (01/13/19 1436)  . vancomycin Stopped (01/13/2019 1956)   PRN Meds:.  HPI: Angela Page is a 48 y.o. female who suffered spinal cord injury years ago and has had tracheostomy, PEG and is bed bound with multiple areas of chronic osteomyelitis involving bilateral aspects of pelvis, sacrum and left tibia who has been admitted frequently to the hospital with concerns of sepsis over the years. She is now similarly admitted and has COVID 19.  She has had blood cultures taken and has been started on broad spectrum antibiotics in form of cefepime and vancomycin. Remdesivir and steroids have been started for COVID.  Again I would strongly, strongly recommend a palliative care consult.  Dr Johnnye Sima is available to answer questions over the phone regarding her care tomorrow though 28th   Review of Systems: Review of Systems  Unable to perform ROS: Other    Past Medical History:  Diagnosis Date  . Acute on chronic respiratory failure with hypoxia ([place])   . Anasarca 06/10/2016  . Asthma   . At high risk for severe sepsis   . Cocaine use 10/08/2013  . Decubitus ulcer of buttock, unstageable ([place])   . Depression   .  Diabetes mellitus without complication ([place])   . GERD (gastroesophageal reflux disease)   . HCAP (healthcare-associated pneumonia) 06/09/2016  . Hepatitis    hx of hepatits frm mono   . Kidney stone   . Lobar pneumonia ([place])   . Overdose of opiate or related narcotic ([place]) 09/02/2014  . Pacemaker   . Pleurisy   . Polysubstance dependence including opioid type drug,  episodic abuse ([place]) 10/27/2013  . Protein calorie malnutrition ([place]) 10/31/2015  . Quadriparesis ([place])   . Quadriplegia and quadriparesis ([place])   . Quadriplegia, C5-C7 incomplete ([place]) 10/08/2013  . Stage IV pressure ulcer of sacral region ([place]) 10/31/2015  . Tracheostomy status (HCC)     Social History   Tobacco Use  . Smoking status: Former Smoker    Packs/day: 1.00    Years: 25.00    Pack years: 25.00    Types: Cigarettes  . Smokeless tobacco: Never Used  Substance Use Topics  . Alcohol use: Never  . Drug use: No    Family History  Problem Relation Age of Onset  . Hypertension Maternal Uncle   . Kidney failure Maternal Uncle   . Colon cancer Mother   . Lung cancer Father   . Hypertension Brother   . Kidney failure Maternal Aunt   . Kidney failure Maternal Uncle   . Colon cancer Maternal Grandfather   . Esophageal cancer Neg Hx    Allergies  Allergen Reactions  . Penicillins Hives    Has patient had a PCN reaction causing immediate rash, facial/tongue/throat swelling, SOB or lightheadedness with hypotension: Yes Has patient had a PCN reaction causing severe rash involving mucus membranes or skin necrosis: Yes Has patient had a PCN reaction that required hospitalization No Has patient had a PCN reaction occurring within the last 10 years: No-childhood allergy If all of the above answers are "NO", then may proceed with Cephalosporin  Tolerated cephalosporins in past  . Erythromycin Nausea And Vomiting  . Nitrofurantoin Monohyd Macro Nausea And Vomiting  . Oxybutynin Other (See Comments)    Dries mouth out     OBJECTIVE: Blood pressure (!) 91/56, pulse 91, temperature 98.1 F (36.7 C), temperature source Oral, resp. rate 16, weight 46.5 kg, last menstrual period 05/27/2016, SpO2 100 %.  Physical Exam  Lab Results Lab Results  Component Value Date   WBC 9.8 12/24/2018   HGB 7.1 (L) 12/22/2018   HCT 25.5 (L) 12/23/2018   MCV 85.0 01/20/2019   PLT 678 (H)  01/11/2019    Lab Results  Component Value Date   CREATININE 0.70 12/21/2018   BUN 19 01/20/2019   NA 131 (L) 12/26/2018   K 5.8 (H) 12/21/2018   CL 103 01/18/2019   CO2 18 (L) 01/16/2019    Lab Results  Component Value Date   ALT 38 01/02/2019   AST 51 (H) 12/27/2018   ALKPHOS 1,621 (H) 01/18/2019   BILITOT 1.2 01/09/2019     Microbiology: Recent Results (from the past 240 hour(s))  Culture, blood (routine x 2)     Status: None (Preliminary result)   Collection Time: 01/13/2019  5:35 PM   Specimen: BLOOD LEFT HAND  Result Value Ref Range Status   Specimen Description BLOOD LEFT HAND  Final   Special Requests   Final    BOTTLES DRAWN AEROBIC AND ANAEROBIC Blood Culture adequate volume   Culture   Final    NO GROWTH < 12 HOURS Performed at [facility], [place],  [place] [address]    Report Status PENDING  Incomplete  Culture, blood (Routine X 2) w Reflex to ID Panel     Status: None (Preliminary result)   Collection Time: 12/27/2018  6:04 PM   Specimen: BLOOD RIGHT FOREARM  Result Value Ref Range Status   Specimen Description BLOOD RIGHT FOREARM  Final   Special Requests   Final    BOTTLES DRAWN AEROBIC AND ANAEROBIC Blood Culture adequate volume   Culture   Final    NO GROWTH < 12 HOURS Performed at [facility], [place] [address] [place], [place] [address]    Report Status PENDING  Incomplete  Respiratory Panel by RT PCR (Flu A&B, Covid) - Nasopharyngeal Swab     Status: Abnormal   Collection Time: 01/19/2019  6:26 PM   Specimen: Nasopharyngeal Swab  Result Value Ref Range Status   SARS Coronavirus 2 by RT PCR POSITIVE (A) NEGATIVE Final    Comment: RESULT CALLED TO, READ BACK BY AND VERIFIED WITH: P PEICKERT RN 12/31/2018 1948 JDW (NOTE) SARS-CoV-2 target nucleic acids are DETECTED. SARS-CoV-2 RNA is generally detectable in upper respiratory specimens  during the acute phase of infection. Positive results are indicative of the presence of  the identified virus, but do not rule out bacterial infection or co-infection with other pathogens not detected by the test. Clinical correlation with patient history and other diagnostic information is necessary to determine patient infection status. The expected result is Negative. Fact Sheet for Patients:  PinkCheek.be Fact Sheet for Healthcare Providers: GravelBags.it This test is not yet approved or cleared by the [place] FDA and  has been authorized for detection and/or diagnosis of SARS-CoV-2 by FDA under an Emergency Use Authorization (EUA).  This EUA will remain in effect (meaning this test can be used) for t he duration of  the COVID-19 declaration under Section 564(b)(1) of the Act, 21 U.S.C. section 360bbb-3(b)(1), unless the authorization is terminated or revoked sooner.    Influenza A by PCR NEGATIVE NEGATIVE Final   Influenza B by PCR NEGATIVE NEGATIVE Final    Comment: (NOTE) The Xpert Xpress SARS-CoV-2/FLU/RSV assay is intended as an aid in  the diagnosis of influenza from Nasopharyngeal swab specimens and  should not be used as a sole basis for treatment. Nasal washings and  aspirates are unacceptable for Xpert Xpress SARS-CoV-2/FLU/RSV  testing. Fact Sheet for Patients: PinkCheek.be Fact Sheet for Healthcare Providers: GravelBags.it This test is not yet approved or cleared by the [place] FDA and  has been authorized for detection and/or diagnosis of SARS-CoV-2 by  FDA under an Emergency Use Authorization (EUA). This EUA will remain  in effect (meaning this test can be used) for the duration of the  Covid-19 declaration under Section 564(b)(1) of the Act, 21  U.S.C. section 360bbb-3(b)(1), unless the authorization is  terminated or revoked. Performed at [facility], [place] [address] [place],  [address]     Alcide Evener, [place] [facility] [phone] pager  01/13/2019, 2:47 PM

## 2019-01-13 NOTE — ED Notes (Signed)
Notified Attending provider, Dr. Neysa Bonito regarding difficulty obtaining labs. Pt stuck x2 by phlebotomist with no success. Awaiting new orders.

## 2019-01-13 NOTE — Procedures (Signed)
  Procedure: R arm PICC placement   EBL:   minimal Complications:  none immediate  See full dictation in [facility] PACS.  D. Rollins Wrightson MD Main # [phone] Pager  [phone]    

## 2019-01-13 NOTE — ED Notes (Signed)
Pt is unable to call out on the call bell. This nurse call facilities to place a pressure call bell. When I tried to plug in the call bell it would not fit. Charge and facilities notified of the issue.    Pt has no way to call out and will check Q15.

## 2019-01-13 NOTE — ED Notes (Signed)
Pt had a bowel movement. Pt was cleaned up and repositioned. Pt has numerous bed sores and her sacral pad was replaced as well as the pad on her left femur head

## 2019-01-13 NOTE — ED Notes (Signed)
Pt's glucose low (68) pt given sips of orange juice.

## 2019-01-13 NOTE — Consult Note (Signed)
Reason for Consult:Left leg wound Referring Physician: Ronna Polio Angela Page is an 48 y.o. female.  HPI: Angela Page was brought to the ED from her facility for lethargy and hypoxia with a concern for sepsis. During her workup she was noted to have what appears to be a pressure sore on her left lateral lower leg. X-rays were consistent with osteomyelitis and orthopedic surgery was consulted. She notes that the facility has been dealing with this for the past couple of months but she hasn't seen a doctor for it. She is insensate in her lower extremities so is not bothered by it.  Past Medical History:  Diagnosis Date  . Acute on chronic respiratory failure with hypoxia ([place])   . Anasarca 06/10/2016  . Asthma   . At high risk for severe sepsis   . Cocaine use 10/08/2013  . Decubitus ulcer of buttock, unstageable ([place])   . Depression   . Diabetes mellitus without complication ([place])   . GERD (gastroesophageal reflux disease)   . HCAP (healthcare-associated pneumonia) 06/09/2016  . Hepatitis    hx of hepatits frm mono   . Kidney stone   . Lobar pneumonia ([place])   . Overdose of opiate or related narcotic ([place]) 09/02/2014  . Pacemaker   . Pleurisy   . Polysubstance dependence including opioid type drug, episodic abuse ([place]) 10/27/2013  . Protein calorie malnutrition ([place]) 10/31/2015  . Quadriparesis ([place])   . Quadriplegia and quadriparesis ([place])   . Quadriplegia, C5-C7 incomplete ([place]) 10/08/2013  . Stage IV pressure ulcer of sacral region ([place]) 10/31/2015  . Tracheostomy status [facility])     Past Surgical History:  Procedure Laterality Date  . APPENDECTOMY    . BACK SURGERY  10/08/2013   fusion of spine, cervical region  . CARDIAC SURGERY    . CYSTOSCOPY W/ URETERAL STENT PLACEMENT Right 03/09/2017   Procedure: CYSTOSCOPY [name] STENT PLACEMENT;  Surgeon: Irine Seal, MD;  Location: [facility];  Service: Urology;  Laterality: Right;  . CYSTOSCOPY W/ URETERAL STENT REMOVAL Right  07/16/2017   Procedure: CYSTOSCOPY WITH STENT REMOVAL;  Surgeon: Franchot Gallo, MD;  Location: [facility];  Service: Urology;  Laterality: Right;  . CYSTOSCOPY/URETEROSCOPY/HOLMIUM LASER/STENT PLACEMENT Right 07/16/2017   Procedure: CYSTOSCOPY/URETEROSCOPY/HOLMIUM LASER/STENT PLACEMENT/ALSO STONE EXTRACTION;  Surgeon: Franchot Gallo, MD;  Location: [facility];  Service: Urology;  Laterality: Right;  . GASTROSTOMY  11/23/2015  . I & D EXTREMITY  10/28/2011   Procedure: IRRIGATION AND DEBRIDEMENT EXTREMITY;  Surgeon: Tennis Must, MD;  Location: [place];  Service: Orthopedics;  Laterality: Right;  Irrigation and debridement right middle finger  . INSERTION OF SUPRAPUBIC CATHETER N/A 07/28/2016   Procedure: INSERTION OF SUPRAPUBIC CATHETER;  Surgeon: Franchot Gallo, MD;  Location: [facility];  Service: Urology;  Laterality: N/A;  . IR GENERIC HISTORICAL  11/23/2015   IR GASTROSTOMY TUBE MOD SED 11/23/2015 [facility]  . IR PATIENT EVAL TECH 0-60 MINS  12/18/2017  . IR REPLACE G-TUBE SIMPLE WO FLUORO  12/19/2017  . LITHOTRIPSY  08/2017  . TRACHEOSTOMY      Family History  Problem Relation Age of Onset  . Hypertension Maternal Uncle   . Kidney failure Maternal Uncle   . Colon cancer Mother   . Lung cancer Father   . Hypertension Brother   . Kidney failure Maternal Aunt   . Kidney failure Maternal Uncle   . Colon cancer Maternal Grandfather   . Esophageal cancer Neg Hx     Social History:  reports that she has quit smoking. Her smoking use included cigarettes. She has a 25.00 pack-year smoking history. She has never used smokeless tobacco. She reports that she does not drink alcohol or use drugs.  Allergies:  Allergies  Allergen Reactions  . Penicillins Hives    Has patient had a PCN reaction causing immediate rash, facial/tongue/throat swelling, SOB or lightheadedness with hypotension: Yes Has patient had a PCN reaction causing severe rash involving mucus membranes or  skin necrosis: Yes Has patient had a PCN reaction that required hospitalization No Has patient had a PCN reaction occurring within the last 10 years: No-childhood allergy If all of the above answers are "NO", then may proceed with Cephalosporin  Tolerated cephalosporins in past  . Erythromycin Nausea And Vomiting  . Nitrofurantoin Monohyd Macro Nausea And Vomiting  . Oxybutynin Other (See Comments)    Dries mouth out     Medications: I have reviewed the patient's current medications.  Results for orders placed or performed during the hospital encounter of 01/11/2019 (from the past 48 hour(s))  Comprehensive metabolic panel     Status: Abnormal   Collection Time: 01/08/2019  5:22 PM  Result Value Ref Range   Sodium 131 (L) 135 - 145 mmol/L   Potassium 5.8 (H) 3.5 - 5.1 mmol/L   Chloride 103 98 - 111 mmol/L   CO2 18 (L) 22 - 32 mmol/L   Glucose, Bld 101 (H) 70 - 99 mg/dL   BUN 19 6 - 20 mg/dL   Creatinine, Ser 0.70 0.44 - 1.00 mg/dL   Calcium 8.7 (L) 8.9 - 10.3 mg/dL   Total Protein 6.9 6.5 - 8.1 g/dL   Albumin 1.7 (L) 3.5 - 5.0 g/dL   AST 51 (H) 15 - 41 U/L   ALT 38 0 - 44 U/L   Alkaline Phosphatase 1,621 (H) 38 - 126 U/L   Total Bilirubin 1.2 0.3 - 1.2 mg/dL   GFR calc non Af Amer >60 >60 mL/min   GFR calc Af Amer >60 >60 mL/min   Anion gap 10 5 - 15    Comment: Performed at [facility], [address] [place], [place] [address]  CBC with Differential     Status: Abnormal   Collection Time: 12/25/2018  5:22 PM  Result Value Ref Range   WBC 9.8 4.0 - 10.5 K/uL   RBC 3.00 (L) 3.87 - 5.11 MIL/uL   Hemoglobin 7.1 (L) 12.0 - 15.0 g/dL   HCT 25.5 (L) 36.0 - 46.0 %   MCV 85.0 80.0 - 100.0 fL   MCH 23.7 (L) 26.0 - 34.0 pg   MCHC 27.8 (L) 30.0 - 36.0 g/dL   RDW 23.2 (H) 11.5 - 15.5 %   Platelets 678 (H) 150 - 400 K/uL   nRBC 0.0 0.0 - 0.2 %   Neutrophils Relative % 66 %   Neutro Abs 6.5 1.7 - 7.7 K/uL   Lymphocytes Relative 22 %   Lymphs Abs 2.2 0.7 - 4.0 K/uL   Monocytes  Relative 7 %   Monocytes Absolute 0.7 0.1 - 1.0 K/uL   Eosinophils Relative 3 %   Eosinophils Absolute 0.3 0.0 - 0.5 K/uL   Basophils Relative 2 %   Basophils Absolute 0.2 (H) 0.0 - 0.1 K/uL   nRBC 0 0 /100 WBC   Abs Immature Granulocytes 0.00 0.00 - 0.07 K/uL   Polychromasia PRESENT     Comment: Performed at [facility], [place] [address] [place], [place] [address]  Lactic acid, plasma     Status: None   Collection Time: 01/06/2019  5:30 PM  Result Value Ref Range   Lactic Acid, Venous 0.8 0.5 - 1.9 mmol/L    Comment: Performed at [facility] [address], [place], [place] [address]  Culture, blood (routine x 2)     Status: None (Preliminary result)   Collection Time: 01/05/2019  5:35 PM   Specimen: BLOOD LEFT HAND  Result Value Ref Range   Specimen Description BLOOD LEFT HAND    Special Requests      BOTTLES DRAWN AEROBIC AND ANAEROBIC Blood Culture adequate volume   Culture      NO GROWTH < 12 HOURS Performed at [facility], [place] [address], [place], [place] [address]    Report Status PENDING   Culture, blood (Routine X 2) w Reflex to ID Panel     Status: None (Preliminary result)   Collection Time: 01/11/2019  6:04 PM   Specimen: BLOOD RIGHT FOREARM  Result Value Ref Range   Specimen Description BLOOD RIGHT FOREARM    Special Requests      BOTTLES DRAWN AEROBIC AND ANAEROBIC Blood Culture adequate volume   Culture      NO GROWTH < 12 HOURS Performed at [facility], [place] [address], [place],  [address]    Report Status PENDING   Respiratory Panel by RT PCR (Flu A&B, Covid) - Nasopharyngeal Swab     Status: Abnormal   Collection Time: 01/17/2019  6:26 PM   Specimen: Nasopharyngeal Swab  Result Value Ref Range   SARS Coronavirus 2 by RT PCR POSITIVE (A) NEGATIVE    Comment: RESULT CALLED TO, READ BACK BY AND VERIFIED WITH: P PEICKERT RN 12/26/2018 1948 JDW (NOTE) SARS-CoV-2 target nucleic acids are DETECTED. SARS-CoV-2 RNA is generally  detectable in upper respiratory specimens  during the acute phase of infection. Positive results are indicative of the presence of the identified virus, but do not rule out bacterial infection or co-infection with other pathogens not detected by the test. Clinical correlation with patient history and other diagnostic information is necessary to determine patient infection status. The expected result is Negative. Fact Sheet for Patients:  PinkCheek.be Fact Sheet for Healthcare Providers: GravelBags.it This test is not yet approved or cleared by the [place] FDA and  has been authorized for detection and/or diagnosis of SARS-CoV-2 by FDA under an Emergency Use Authorization (EUA).  This EUA will remain in effect (meaning this test can be used) for t he duration of  the COVID-19 declaration under Section 564(b)(1) of the Act, 21 U.S.C. section 360bbb-3(b)(1), unless the authorization is terminated or revoked sooner.    Influenza A by PCR NEGATIVE NEGATIVE   Influenza B by PCR NEGATIVE NEGATIVE    Comment: (NOTE) The Xpert Xpress SARS-CoV-2/FLU/RSV assay is intended as an aid in  the diagnosis of influenza from Nasopharyngeal swab specimens and  should not be used as a sole basis for treatment. Nasal washings and  aspirates are unacceptable for Xpert Xpress SARS-CoV-2/FLU/RSV  testing. Fact Sheet for Patients: PinkCheek.be Fact Sheet for Healthcare Providers: GravelBags.it This test is not yet approved or cleared by the [place] FDA and  has been authorized for detection and/or diagnosis of SARS-CoV-2 by  FDA under an Emergency Use Authorization (EUA). This EUA will remain  in effect (meaning this test can be used) for the duration of the  Covid-19 declaration under Section 564(b)(1) of the Act, 21  U.S.C. section 360bbb-3(b)(1), unless the authorization is   terminated or revoked. Performed at [facility], [place] [address] [place], [place] [address]   Urinalysis, Routine w reflex microscopic     Status: Abnormal   Collection Time: 01/20/2019  8:06 PM  Result Value Ref Range   Color, Urine AMBER (A) YELLOW    Comment: BIOCHEMICALS MAY BE AFFECTED BY COLOR   APPearance TURBID (A) CLEAR   Specific Gravity, Urine 1.020 1.005 - 1.030   pH 9.0 (H) 5.0 - 8.0   Glucose, UA NEGATIVE NEGATIVE mg/dL   Hgb urine dipstick NEGATIVE NEGATIVE   Bilirubin Urine SMALL (A) NEGATIVE   Ketones, ur NEGATIVE NEGATIVE mg/dL   Protein, ur >=300 (A) NEGATIVE mg/dL   Nitrite POSITIVE (A) NEGATIVE   Leukocytes,Ua LARGE (A) NEGATIVE   RBC / HPF >50 (H) 0 - 5 RBC/hpf   WBC, UA 21-50 0 - 5 WBC/hpf   Bacteria, UA MANY (A) NONE SEEN   Squamous Epithelial / LPF 0-5 0 - 5   WBC Clumps PRESENT    Mucus PRESENT    Triple Phosphate Crystal PRESENT    Non Squamous Epithelial 0-5 (A) NONE SEEN    Comment: Performed at [facility], [place] [address] [place], [place] [address]    DG Tibia/Fibula Left  Result Date: 12/23/2018 CLINICAL DATA:  Altered mental status, lethargy, sleeping, infection EXAM: LEFT TIBIA AND FIBULA - 2 VIEW COMPARISON:  Portable exam 1723 hours without priors for comparison FINDINGS: Diffuse osseous demineralization. Knee and ankle joint alignments normal. No acute fracture or dislocation. Soft tissue irregularity at the lateral aspect of lower LEFT leg mid to distally. Associated cortical destruction of the lateral distal fibula consistent with osteomyelitis. IMPRESSION: Osseous demineralization with cortical destruction of the lateral margin of the mid to distal LEFT fibula consistent with osteomyelitis. Findings called to Dr. Alvino Chapel on 01/17/2019 at 1753 hours. Electronically Signed   By: Lavonia Dana M.D.   On: 01/05/2019 17:54   CT ABDOMEN PELVIS W CONTRAST  Result Date: 01/03/2019 CLINICAL DATA:  Osteomyelitis elevated alk-phos EXAM:  CT ABDOMEN AND PELVIS WITH CONTRAST TECHNIQUE: Multidetector CT imaging of the abdomen and pelvis was performed using the standard protocol following bolus administration of intravenous contrast. CONTRAST:  53m OMNIPAQUE IOHEXOL 300 MG/ML  SOLN COMPARISON:  October 21, 2018 FINDINGS: Lower chest: The visualized heart size within normal limits. No pericardial fluid/thickening. Pacemaker lead tips are seen. No hiatal hernia. There is a small left pleural effusion. Patchy/streaky airspace opacities seen at the right lung base. Hepatobiliary: There is heterogeneous parenchymal enhancement throughout the liver. Mild intrahepatic biliary ductal dilatation seen. There is hyperenhancement of the gallbladder wall with layering gallstones and significant surrounding pericholecystic fluid and fat stranding changes extending perihepatic and into the right pericolonic gutter. Pancreas: Unremarkable. No pancreatic ductal dilatation or surrounding inflammatory changes. Spleen: Normal in size without focal abnormality. Adrenals/Urinary Tract: Both adrenal glands appear normal. There is a 7 mm calculus seen within the upper pole of the left kidney as on prior exam. Mild bilateral perinephric stranding is seen. No hydronephrosis is noted. There is diffuse bladder wall thickening. There is a suprapubic catheter which appears to protrude through the posterior bladder wall. Within the deep pelvis adjacent to the suprapubic catheter there is a small amount of fluid within the deep pelvis. Stomach/Bowel: There is diffuse wall thickening of the distal stomach/pylorus with surrounding mesenteric fat stranding changes seen. A percutaneous gastrotomy tube is seen projecting over the  mid body of the stomach. The small bowel and colon are unremarkable. The Vascular/Lymphatic: Scattered aortic atherosclerotic calcifications are seen without aneurysmal dilatation. Reproductive: The uterus and adnexa are unremarkable. Other: There is diffuse  anasarca. Along the left rectus sheath there is interval decreased appearance to the probable hematoma measuring 4.3 cm. Musculoskeletal: There is interval worsening in the ulceration over the posterior left greater trochanter with further fragmentation of the greater trochanter cortical irregularity involving the entire femoroacetabular joint. There is subcutaneous emphysema and non loculated fluid seen overlying the greater trochanter. There is also further ulceration of the posterior right ischial tuberosity with a small focus of subcutaneous emphysema. There is cortical irregularity and periosteal reaction with erosion at the posterior ischial tuberosity. There is been a prior resection of the distal sacrum and coccyx. Superficial ulceration seen at the distal sacral element with further sclerosis and cortical irregularity at the for resection margins. IMPRESSION: 1. Small left pleural effusion and patchy atelectasis at the right lung base. 2. Findings which could be suggestive of acute cholecystitis with hyperenhancement of the gallbladder wall, pericholecystic fluid and pericholecystic inflammatory changes. Inflammatory changes extend around the liver and into the right pericolic gutter. If further evaluation is required, would recommend right upper quadrant ultrasound. 3. Diffuse gastric wall thickening of the distal stomach/pylorus which could be due to mild gastritis. 4. Suprapubic catheter which appears to protrude through the posterior bladder wall with fluid in the deep pelvis which could represent urinary leak or ascites. 5. Findings suggestive of chronic cystitis. 6. Further worsening in the large area of ulceration over the posterior left greater trochanter with progression of chronic osteomyelitis and fragmentation of the greater trochanter and left hip. There is adjacent non loculated fluid in subcutaneous emphysema which could represent phlegmon/early abscess. 7. Progression of the ulceration over  the right ischial tuberosity with chronic osteomyelitis. 8. Focal sacral decubitus ulcer with sclerosis at the resection margin of the distal sacrum, likely progression of chronic osteomyelitis. 9. Diffuse anasarca 10. Resolving left rectus sheath 1. These results were called by telephone at the time of interpretation on 01/18/2019 at 10:55 pm to provider Davonna Belling , who verbally acknowledged these results. Electronically Signed   By: Prudencio Pair M.D.   On: 01/08/2019 23:00   DG Chest Portable 1 View  Result Date: 01/01/2019 CLINICAL DATA:  Lethargy, sleeping, multiple source EXAM: PORTABLE CHEST 1 VIEW COMPARISON:  Portable exam 1716 hours compared to 08/19/2018 FINDINGS: Tracheostomy tube projects over tracheal air column. LEFT subclavian sequential transvenous pacemaker leads project over RIGHT atrium and RIGHT ventricle. Upper normal heart size. Mediastinal contours and pulmonary vascularity normal. Lungs grossly clear. No infiltrate, pleural effusion or pneumothorax. Prior cervical spine fusion. IMPRESSION: No acute abnormalities. Electronically Signed   By: Lavonia Dana M.D.   On: 12/28/2018 17:55    Review of Systems  Constitutional: Positive for fatigue.  HENT: Negative for ear discharge, ear pain, hearing loss and tinnitus.   Eyes: Negative for photophobia and pain.  Respiratory: Negative for cough and shortness of breath.   Cardiovascular: Negative for chest pain.  Gastrointestinal: Negative for abdominal pain, nausea and vomiting.  Genitourinary: Negative for dysuria, flank pain, frequency and urgency.  Musculoskeletal: Negative for back pain, myalgias and neck pain.  Neurological: Negative for dizziness and headaches.  Hematological: Does not bruise/bleed easily.  Psychiatric/Behavioral: The patient is not nervous/anxious.    Blood pressure (!) 92/57, pulse 92, temperature 98.1 F (36.7 C), temperature source Oral, resp. rate 14, weight 46.5  kg, last menstrual period  05/27/2016, SpO2 100 %. Physical Exam  Constitutional: She appears well-developed and well-nourished. No distress.  HENT:  Head: Normocephalic and atraumatic.  Eyes: Conjunctivae are normal. Right eye exhibits no discharge. Left eye exhibits no discharge. No scleral icterus.  Cardiovascular: Normal rate and regular rhythm.  Respiratory: Effort normal. No respiratory distress.  Musculoskeletal:     Cervical back: Normal range of motion.     Comments: LLE No traumatic wounds, ecchymosis, or rash  Shallow ulceration lateral lower leg, no discharge or odor  No knee or ankle effusion  Knee stable to varus/ valgus and anterior/posterior stress  Sens DPN, SPN, TN absent  Motor EHL, ext, flex, evers absent  DP 2+, PT 2+, No significant edema  Neurological: She is alert.  Skin: Skin is warm and dry. She is not diaphoretic.  Psychiatric: She has a normal mood and affect. Her behavior is normal.    Assessment/Plan: Left lower leg ulceration with osteo -- Would recommend WOC and ID consults. No role for surgical intervention at this stage though if she fails conservative management then may need fibular resection down the road. She should see Dr. Stann Mainland or Dr. Sharol Given in follow-up after discharge should she not improve.    Lisette Abu, PA-C Orthopedic Surgery [phone] 01/13/2019, 11:28 AM

## 2019-01-14 LAB — COMPREHENSIVE METABOLIC PANEL
ALT: 27 U/L (ref 0–44)
AST: 32 U/L (ref 15–41)
Albumin: 1.5 g/dL — ABNORMAL LOW (ref 3.5–5.0)
Alkaline Phosphatase: 1118 U/L — ABNORMAL HIGH (ref 38–126)
Anion gap: 12 (ref 5–15)
BUN: 19 mg/dL (ref 6–20)
CO2: 17 mmol/L — ABNORMAL LOW (ref 22–32)
Calcium: 8.4 mg/dL — ABNORMAL LOW (ref 8.9–10.3)
Chloride: 106 mmol/L (ref 98–111)
Creatinine, Ser: 0.74 mg/dL (ref 0.44–1.00)
GFR calc Af Amer: >60 mL/min (ref >60)
GFR calc non Af Amer: >60 mL/min (ref >60)
Glucose, Bld: 86 mg/dL (ref 70–99)
Potassium: 4 mmol/L (ref 3.5–5.1)
Sodium: 135 mmol/L (ref 135–145)
Total Bilirubin: 1.5 mg/dL — ABNORMAL HIGH (ref 0.3–1.2)
Total Protein: 6 g/dL — ABNORMAL LOW (ref 6.5–8.1)

## 2019-01-14 LAB — TYPE AND SCREEN
ABO/RH(D): A POS
Antibody Screen: NEGATIVE
Unit division: 0

## 2019-01-14 LAB — HEMOGLOBIN AND HEMATOCRIT, BLOOD
HCT: 26.7 % — ABNORMAL LOW (ref 36.0–46.0)
Hemoglobin: 7.6 g/dL — ABNORMAL LOW (ref 12.0–15.0)

## 2019-01-14 LAB — BPAM RBC
Blood Product Expiration Date: 202101192359
ISSUE DATE / TIME: 202012242254
Unit Type and Rh: 6200

## 2019-01-14 LAB — CBC
HCT: 28.2 % — ABNORMAL LOW (ref 36.0–46.0)
Hemoglobin: 8.3 g/dL — ABNORMAL LOW (ref 12.0–15.0)
MCH: 25.9 pg — ABNORMAL LOW (ref 26.0–34.0)
MCHC: 29.4 g/dL — ABNORMAL LOW (ref 30.0–36.0)
MCV: 88.1 fL (ref 80.0–100.0)
Platelets: 459 10*3/uL — ABNORMAL HIGH (ref 150–400)
RBC: 3.2 MIL/uL — ABNORMAL LOW (ref 3.87–5.11)
RDW: 21.5 % — ABNORMAL HIGH (ref 11.5–15.5)
WBC: 11.6 10*3/uL — ABNORMAL HIGH (ref 4.0–10.5)
nRBC: 0 % (ref 0.0–0.2)

## 2019-01-14 LAB — C-REACTIVE PROTEIN: CRP: 11.5 mg/dL — ABNORMAL HIGH (ref <1.0)

## 2019-01-14 LAB — GAMMA GT: GGT: 401 U/L — ABNORMAL HIGH (ref 7–50)

## 2019-01-14 LAB — FERRITIN: Ferritin: 479 ng/mL — ABNORMAL HIGH (ref 11–307)

## 2019-01-14 LAB — D-DIMER, QUANTITATIVE: D-Dimer, Quant: 1.43 ug/mL-FEU — ABNORMAL HIGH (ref 0.00–0.50)

## 2019-01-14 MED ORDER — SODIUM CHLORIDE 0.9% FLUSH
10.0000 mL | Freq: Two times a day (BID) | INTRAVENOUS | Status: DC
  Administered 2019-01-14 – 2019-01-23 (×16): 10 mL
  Administered 2019-01-24: 20 mL
  Administered 2019-01-24 – 2019-02-05 (×23): 10 mL

## 2019-01-14 MED ORDER — SODIUM CHLORIDE 0.9 % IV BOLUS
500.0000 mL | Freq: Once | INTRAVENOUS | Status: AC
  Administered 2019-01-14: 08:00:00 500 mL via INTRAVENOUS

## 2019-01-14 MED ORDER — MELATONIN 3 MG PO TABS
3.0000 mg | ORAL_TABLET | Freq: Every evening | ORAL | Status: DC | PRN
  Administered 2019-01-14 – 2019-01-16 (×3): 3 mg via ORAL
  Filled 2019-01-14 (×5): qty 1

## 2019-01-14 MED ORDER — CHLORHEXIDINE GLUCONATE CLOTH 2 % EX PADS
6.0000 | MEDICATED_PAD | Freq: Every day | CUTANEOUS | Status: DC
  Administered 2019-01-14 – 2019-01-17 (×2): 6 via TOPICAL

## 2019-01-14 MED ORDER — CHLORHEXIDINE GLUCONATE CLOTH 2 % EX PADS
6.0000 | MEDICATED_PAD | Freq: Every day | CUTANEOUS | Status: DC
  Administered 2019-01-14 – 2019-01-16 (×3): 6 via TOPICAL

## 2019-01-14 MED ORDER — SODIUM CHLORIDE 0.9% FLUSH: 10.0000 mL | INTRAVENOUS | Status: DC | PRN

## 2019-01-14 NOTE — Progress Notes (Addendum)
PROGRESS NOTE    Angela Page    Code Status: Full Code  NOM:[id number] DOB: November 01, 1970 DOA: 12/24/2018  PCP: Default, Provider, MD    Hospital Summary   Angela Page is a 48 y.o. female with medical history significant of Hx of quadriplegia following spinal cord injury with trach and PEG dependency chronically on 3L, chronic diastolic CHF, neurogenic orthostatic hypotension, COPD who presented from facility with concerns of lethargy. Patient is able to nod to yes or no to questions and attempts to answer questions although difficult to hear due to her tracheostomy.  Reportedly she was sent over by staff due to lethargy and concerns for sepsis.  She was found to be hypoxic down to 80% on 4 L and had to be placed on nonrebreather by EMS.    CT abdomen and pelvis showing suprapubic catheter appearing to protrude through posterior bladder wall with fluid in the pelvis.    Urology was consulted, SPT replaced by urology on 12/24, continue gravity drainage, return to normal interval exchanges at facility and CT cystogram to evaluate bladder.   Left tibia/fibula x-ray showing osseous demineralization concerning for osteomyelitis and orthopedic surgery was consulted who recommended ID consult, wound care and conservative management and did not recommend surgery at this time.    A & P   Active Problems:   Quadriplegia (HCC)   Decubitus ulcer of sacral region, stage 4 (HCC)   Anemia, iron deficiency   Tracheostomy status (HCC)   PEG (percutaneous endoscopic gastrostomy) status ([place])   Complicated UTI (urinary tract infection)   Sepsis (HCC)   Hyperkalemia  Acute on chronic hypoxia in the setting of COVID infection with tracheostomy Baseline on 3 L- requiring 9L yesterday, turned down to 4 L this a.m. and SPO2 100% CRP 11.5, may be acute phase reactant from osteomyelitis as she is not requiring much higher levels of O2 than baseline -Continue IV Decadron and  remdesivir -Follow-up CRP in a.m. -will hold Actemra for now  Cellulitis of the back  Continue vancomycin and cefepime per ID  Osteomyelitis of the left fibula, left greater trochanter, distal sacrum and left hip Orthopedic surgery consulted: Continue with medical management at this time with no surgical indication.  Recommended wound care and ID consult ID consulted recommended continuing Vanco/cefepime as well as palliative care consult due to multiple comorbidities, multiple hospitalizations and poor overall prognosis -Palliative care consulted, appreciate recommendations  UTI with suprapubic catheter  Urology consulted and recommended conservative management  Stage 4 decubitus ulcer Palliative care and wound care consult  Hypotension due to multiple infectious source MAP 65 -Fluids  Hyperkalemia Resolved with Kayexalate x1  Acute on Chronic anemia No obvious bleed Hemoglobin of 7.1->6.1->8.3 status post 1 unit PRBC -Trend H&H.  If hemoglobin stable can start prophylactic anticoagulation  Quadriplegic due to spinal cord injury Poor prognosis with ongoing infections Palliative care consulted  PEG tube dependent -Consult dietitian  Elevated LFTs ALT/ALT unremarkable Alk phos elevated 1118, likely bone etiology as above plus/minus PEG tube -GGT   DVT prophylaxis: Holding DVT prophylaxis pending further work-up above.  SCDs Diet: N.p.o. except sips with meds pending SLP eval Family Communication: Discussed with patient at bedside Disposition Plan: Pending ongoing clinical work-up  Consultants  Orthopedic surgery Urology Infectious disease Palliative care  Procedures  Blood transfusion  Antibiotics   Anti-infectives (From admission, onward)   Start     Dose/Rate Route Frequency Ordered Stop   01/13/19 1000  remdesivir 100 mg in sodium chloride  0.9 % 100 mL IVPB     100 mg 200 mL/hr over 30 Minutes Intravenous Daily 12/30/2018 2243 01/17/19 0959    01/13/19 0200  ceFEPIme (MAXIPIME) 2 g in sodium chloride 0.9 % 100 mL IVPB     2 g 200 mL/hr over 30 Minutes Intravenous Every 8 hours 01/16/2019 1831     01/09/2019 2245  remdesivir 200 mg in sodium chloride 0.9% 250 mL IVPB     200 mg 580 mL/hr over 30 Minutes Intravenous Once 12/29/2018 2243 01/13/19 0210   01/05/2019 1830  vancomycin (VANCOCIN) IVPB 1000 mg/200 mL premix     1,000 mg 200 mL/hr over 60 Minutes Intravenous Every 24 hours 01/05/2019 1831     12/25/2018 1730  vancomycin (VANCOCIN) IVPB 1000 mg/200 mL premix  Status:  Discontinued     1,000 mg 200 mL/hr over 60 Minutes Intravenous  Once 01/18/2019 1717 12/22/2018 1831   01/08/2019 1715  ceFEPIme (MAXIPIME) 2 g in sodium chloride 0.9 % 100 mL IVPB     2 g 200 mL/hr over 30 Minutes Intravenous  Once 01/11/2019 1713 01/20/2019 1837           Subjective   Patient evaluated at bedside resting comfortably on 9 L via trach.  During encounter I turned down O2 to 4 L and patient remained at 100% SPO2.  Able to converse somewhat with limitations from tracheostomy.  Currently denies any complaints and feels well.  Objective   Vitals:   01/14/19 0515 01/14/19 0600 01/14/19 0800 01/14/19 1100  BP: (!) 93/58 (!) 95/53  (!) 88/57  Pulse:    85  Resp: 14 (!) 23  14  Temp:   (!) 97.5 F (36.4 C) 98 F (36.7 C)  TempSrc:   Oral Oral  SpO2:    100%  Weight:        Intake/Output Summary (Last 24 hours) at 01/14/2019 1341 Last data filed at 01/13/2019 2317 Gross per 24 hour  Intake 915 ml  Output --  Net 915 ml   Filed Weights   12/29/2018 1704  Weight: 46.5 kg    Examination:  Physical Exam Vitals and nursing note reviewed.  Constitutional:      Comments: Chronically ill appearing  HENT:     Head: Normocephalic.  Neck:     Comments: Trach tube Cardiovascular:     Rate and Rhythm: Normal rate and regular rhythm.  Pulmonary:     Effort: Pulmonary effort is normal. No respiratory distress.  Abdominal:     General: Abdomen is  flat.     Palpations: Abdomen is soft.  Musculoskeletal:     Comments: Chronic bilateral upper and lower extremity contractures  Neurological:     Mental Status: She is alert. Mental status is at baseline.  Psychiatric:        Mood and Affect: Mood normal.        Behavior: Behavior normal.     Data Reviewed: I have personally reviewed following labs and imaging studies  CBC: Recent Labs  Lab 12/27/2018 1722 01/13/19 1900 01/14/19 0304  WBC 9.8 15.5* 11.6*  NEUTROABS 6.5  --   --   HGB 7.1* 6.1* 8.3*  HCT 25.5* 22.0* 28.2*  MCV 85.0 84.9 88.1  PLT 678* 516* 034*   Basic Metabolic Panel: Recent Labs  Lab 01/07/2019 1722 01/13/19 1900 01/14/19 0304  NA 131* 134* 135  K 5.8* 4.3 4.0  CL 103 108 106  CO2 18* 16* 17*  GLUCOSE 101* 58*  86  BUN '19 19 19  '$ CREATININE 0.70 0.75 0.74  CALCIUM 8.7* 8.3* 8.4*  MG  --  1.7  --    GFR: Estimated Creatinine Clearance: 63.1 mL/min (by C-G formula based on SCr of 0.74 mg/dL). Liver Function Tests: Recent Labs  Lab 12/25/2018 1722 01/13/19 1900 01/14/19 0304  AST 51* 33 32  ALT 38 28 27  ALKPHOS 1,621* 1,155* 1,118*  BILITOT 1.2 1.3* 1.5*  PROT 6.9 6.1* 6.0*  ALBUMIN 1.7* 1.5* 1.5*   No results for input(s): LIPASE, AMYLASE in the last 168 hours. No results for input(s): AMMONIA in the last 168 hours. Coagulation Profile: No results for input(s): INR, PROTIME in the last 168 hours. Cardiac Enzymes: No results for input(s): CKTOTAL, CKMB, CKMBINDEX, TROPONINI in the last 168 hours. BNP (last 3 results) No results for input(s): PROBNP in the last 8760 hours. HbA1C: No results for input(s): HGBA1C in the last 72 hours. CBG: Recent Labs  Lab 01/13/19 2107  GLUCAP 68*   Lipid Profile: No results for input(s): CHOL, HDL, LDLCALC, TRIG, CHOLHDL, LDLDIRECT in the last 72 hours. Thyroid Function Tests: No results for input(s): TSH, T4TOTAL, FREET4, T3FREE, THYROIDAB in the last 72 hours. Anemia Panel: Recent Labs     01/14/19 0000  FERRITIN 479*   Sepsis Labs: Recent Labs  Lab 12/25/2018 1730  LATICACIDVEN 0.8    Recent Results (from the past 240 hour(s))  Culture, blood (routine x 2)     Status: None (Preliminary result)   Collection Time: 01/11/2019  5:35 PM   Specimen: BLOOD LEFT HAND  Result Value Ref Range Status   Specimen Description BLOOD LEFT HAND  Final   Special Requests   Final    BOTTLES DRAWN AEROBIC AND ANAEROBIC Blood Culture adequate volume   Culture   Final    NO GROWTH 2 DAYS Performed at [facility], [place] [address] [place], [place] [address]    Report Status PENDING  Incomplete  Culture, blood (Routine X 2) w Reflex to ID Panel     Status: None (Preliminary result)   Collection Time: 12/30/2018  6:04 PM   Specimen: BLOOD RIGHT FOREARM  Result Value Ref Range Status   Specimen Description BLOOD RIGHT FOREARM  Final   Special Requests   Final    BOTTLES DRAWN AEROBIC AND ANAEROBIC Blood Culture adequate volume   Culture   Final    NO GROWTH 2 DAYS Performed at [facility], [place] [address], [place], [place] [address]    Report Status PENDING  Incomplete  Respiratory Panel by RT PCR (Flu A&B, Covid) - Nasopharyngeal Swab     Status: Abnormal   Collection Time: 01/09/2019  6:26 PM   Specimen: Nasopharyngeal Swab  Result Value Ref Range Status   SARS Coronavirus 2 by RT PCR POSITIVE (A) NEGATIVE Final    Comment: RESULT CALLED TO, READ BACK BY AND VERIFIED WITH: P PEICKERT RN 01/14/2019 1948 JDW (NOTE) SARS-CoV-2 target nucleic acids are DETECTED. SARS-CoV-2 RNA is generally detectable in upper respiratory specimens  during the acute phase of infection. Positive results are indicative of the presence of the identified virus, but do not rule out bacterial infection or co-infection with other pathogens not detected by the test. Clinical correlation with patient history and other diagnostic information is necessary to determine patient infection status. The  expected result is Negative. Fact Sheet for Patients:  PinkCheek.be Fact Sheet for Healthcare Providers: GravelBags.it This test is not yet approved or  cleared by the [place] and  has been authorized for detection and/or diagnosis of SARS-CoV-2 by FDA under an Emergency Use Authorization (EUA).  This EUA will remain in effect (meaning this test can be used) for t he duration of  the COVID-19 declaration under Section 564(b)(1) of the Act, 21 U.S.C. section 360bbb-3(b)(1), unless the authorization is terminated or revoked sooner.    Influenza A by PCR NEGATIVE NEGATIVE Final   Influenza B by PCR NEGATIVE NEGATIVE Final    Comment: (NOTE) The Xpert Xpress SARS-CoV-2/FLU/RSV assay is intended as an aid in  the diagnosis of influenza from Nasopharyngeal swab specimens and  should not be used as a sole basis for treatment. Nasal washings and  aspirates are unacceptable for Xpert Xpress SARS-CoV-2/FLU/RSV  testing. Fact Sheet for Patients: PinkCheek.be Fact Sheet for Healthcare Providers: GravelBags.it This test is not yet approved or cleared by the [place] FDA and  has been authorized for detection and/or diagnosis of SARS-CoV-2 by  FDA under an Emergency Use Authorization (EUA). This EUA will remain  in effect (meaning this test can be used) for the duration of the  Covid-19 declaration under Section 564(b)(1) of the Act, 21  U.S.C. section 360bbb-3(b)(1), unless the authorization is  terminated or revoked. Performed at [facility], [place] [address] [place], [place] [address]   Urine culture     Status: Abnormal (Preliminary result)   Collection Time: 01/11/2019  8:22 PM   Specimen: Urine, Catheterized  Result Value Ref Range Status   Specimen Description URINE, CATHETERIZED  Final   Special Requests NONE  Final   Culture (A)  Final    >=100,000  COLONIES/mL PROTEUS MIRABILIS SUSCEPTIBILITIES TO FOLLOW Performed at [facility], [place] [address] [place], [place] [address]    Report Status PENDING  Incomplete         Radiology Studies: DG Tibia/Fibula Left  Result Date: 01/10/2019 CLINICAL DATA:  Altered mental status, lethargy, sleeping, infection EXAM: LEFT TIBIA AND FIBULA - 2 VIEW COMPARISON:  Portable exam 1723 hours without priors for comparison FINDINGS: Diffuse osseous demineralization. Knee and ankle joint alignments normal. No acute fracture or dislocation. Soft tissue irregularity at the lateral aspect of lower LEFT leg mid to distally. Associated cortical destruction of the lateral distal fibula consistent with osteomyelitis. IMPRESSION: Osseous demineralization with cortical destruction of the lateral margin of the mid to distal LEFT fibula consistent with osteomyelitis. Findings called to Dr. Alvino Chapel on 01/18/2019 at 1753 hours. Electronically Signed   By: Lavonia Dana M.D.   On: 12/22/2018 17:54   CT PELVIS W CONTRAST  Result Date: 01/13/2019 CLINICAL DATA:  Question bladder perforation, abnormal CT with suprapubic catheter questionably extending through bladder wall, history of quadriplegia, diabetes mellitus EXAM: CT PELVIS WITH CONTRAST TECHNIQUE: Multidetector CT imaging of the pelvis was performed using the standard protocol following the bolus administration of intravenous contrast. Dilute contrast was instilled retrograde into the urinary bladder prior to imaging. CONTRAST:  33m OMNIPAQUE IOHEXOL 300 MG/ML SOLN mixed with 250 cc of sterile saline, volume instilled 75 mL. COMPARISON:  CT abdomen and pelvis 01/10/2019 FINDINGS: Urinary Tract: Instilled contrast material is identified within the bladder lumen. Reflux of contrast into the distal ureters bilaterally. Mild bladder wall thickening. Mildly irregular posterior wall the bladder which may be related to debris, clot, or wall irregularity. No extravasation  of contrast into the extraperitoneal spaces surrounding the bladder nor into the peritoneal cavity. Bowel: Gas, fluid, and stool throughout  colon. Bowel loops grossly unremarkable. Vascular/Lymphatic: Atherosclerotic calcifications. No adenopathy. 11 mm LEFT inguinal node. Reproductive:  Atrophic uterus. Other: Small LEFT rectus sheath collection question hematoma unchanged, 3.8 x 1.7 cm. Free fluid in pelvis. No free air. Musculoskeletal: Chronic osseous demineralization. Bone destruction at the sacrum, BILATERAL ischia, and proximal RIGHT femur from decubitus ulcers and suspected osteomyelitis. IMPRESSION: Mild bladder wall thickening without evidence of bladder perforation/contrast extravasation. Vesicoureteral reflux into distal ureters bilaterally. Slightly irregular posterior wall of bladder lumen, could be related to bladder wall thickening or dependent debris. Small amount of nonspecific free pelvic fluid. Small LEFT rectus sheath hematoma unchanged. Osseous destruction from decubitus ulcers and suspected osteomyelitis at the sacrum, BILATERAL ischia, and proximal RIGHT femur. Aortic Atherosclerosis (ICD10-I70.0). Electronically Signed   By: Lavonia Dana M.D.   On: 01/13/2019 16:51   CT ABDOMEN PELVIS W CONTRAST  Result Date: 01/11/2019 CLINICAL DATA:  Osteomyelitis elevated alk-phos EXAM: CT ABDOMEN AND PELVIS WITH CONTRAST TECHNIQUE: Multidetector CT imaging of the abdomen and pelvis was performed using the standard protocol following bolus administration of intravenous contrast. CONTRAST:  42m OMNIPAQUE IOHEXOL 300 MG/ML  SOLN COMPARISON:  October 21, 2018 FINDINGS: Lower chest: The visualized heart size within normal limits. No pericardial fluid/thickening. Pacemaker lead tips are seen. No hiatal hernia. There is a small left pleural effusion. Patchy/streaky airspace opacities seen at the right lung base. Hepatobiliary: There is heterogeneous parenchymal enhancement throughout the liver. Mild  intrahepatic biliary ductal dilatation seen. There is hyperenhancement of the gallbladder wall with layering gallstones and significant surrounding pericholecystic fluid and fat stranding changes extending perihepatic and into the right pericolonic gutter. Pancreas: Unremarkable. No pancreatic ductal dilatation or surrounding inflammatory changes. Spleen: Normal in size without focal abnormality. Adrenals/Urinary Tract: Both adrenal glands appear normal. There is a 7 mm calculus seen within the upper pole of the left kidney as on prior exam. Mild bilateral perinephric stranding is seen. No hydronephrosis is noted. There is diffuse bladder wall thickening. There is a suprapubic catheter which appears to protrude through the posterior bladder wall. Within the deep pelvis adjacent to the suprapubic catheter there is a small amount of fluid within the deep pelvis. Stomach/Bowel: There is diffuse wall thickening of the distal stomach/pylorus with surrounding mesenteric fat stranding changes seen. A percutaneous gastrotomy tube is seen projecting over the mid body of the stomach. The small bowel and colon are unremarkable. The Vascular/Lymphatic: Scattered aortic atherosclerotic calcifications are seen without aneurysmal dilatation. Reproductive: The uterus and adnexa are unremarkable. Other: There is diffuse anasarca. Along the left rectus sheath there is interval decreased appearance to the probable hematoma measuring 4.3 cm. Musculoskeletal: There is interval worsening in the ulceration over the posterior left greater trochanter with further fragmentation of the greater trochanter cortical irregularity involving the entire femoroacetabular joint. There is subcutaneous emphysema and non loculated fluid seen overlying the greater trochanter. There is also further ulceration of the posterior right ischial tuberosity with a small focus of subcutaneous emphysema. There is cortical irregularity and periosteal reaction with  erosion at the posterior ischial tuberosity. There is been a prior resection of the distal sacrum and coccyx. Superficial ulceration seen at the distal sacral element with further sclerosis and cortical irregularity at the for resection margins. IMPRESSION: 1. Small left pleural effusion and patchy atelectasis at the right lung base. 2. Findings which could be suggestive of acute cholecystitis with hyperenhancement of the gallbladder wall, pericholecystic fluid and pericholecystic inflammatory changes. Inflammatory changes extend around the liver and into the  right pericolic gutter. If further evaluation is required, would recommend right upper quadrant ultrasound. 3. Diffuse gastric wall thickening of the distal stomach/pylorus which could be due to mild gastritis. 4. Suprapubic catheter which appears to protrude through the posterior bladder wall with fluid in the deep pelvis which could represent urinary leak or ascites. 5. Findings suggestive of chronic cystitis. 6. Further worsening in the large area of ulceration over the posterior left greater trochanter with progression of chronic osteomyelitis and fragmentation of the greater trochanter and left hip. There is adjacent non loculated fluid in subcutaneous emphysema which could represent phlegmon/early abscess. 7. Progression of the ulceration over the right ischial tuberosity with chronic osteomyelitis. 8. Focal sacral decubitus ulcer with sclerosis at the resection margin of the distal sacrum, likely progression of chronic osteomyelitis. 9. Diffuse anasarca 10. Resolving left rectus sheath 1. These results were called by telephone at the time of interpretation on 12/22/2018 at 10:55 pm to provider Davonna Belling , who verbally acknowledged these results. Electronically Signed   By: Prudencio Pair M.D.   On: 01/20/2019 23:00   DG Chest Portable 1 View  Result Date: 01/05/2019 CLINICAL DATA:  Lethargy, sleeping, multiple source EXAM: PORTABLE CHEST 1 VIEW  COMPARISON:  Portable exam 1716 hours compared to 08/19/2018 FINDINGS: Tracheostomy tube projects over tracheal air column. LEFT subclavian sequential transvenous pacemaker leads project over RIGHT atrium and RIGHT ventricle. Upper normal heart size. Mediastinal contours and pulmonary vascularity normal. Lungs grossly clear. No infiltrate, pleural effusion or pneumothorax. Prior cervical spine fusion. IMPRESSION: No acute abnormalities. Electronically Signed   By: Lavonia Dana M.D.   On: 01/09/2019 17:55   IR PICC PLACEMENT RIGHT >5 YRS INC IMG GUIDE  Result Date: 01/13/2019 CLINICAL DATA:  Osteomyelitis, needs durable venous access EXAM: PICC PLACEMENT WITH ULTRASOUND AND FLUOROSCOPY FLUOROSCOPY TIME:  0.6 minutes; 12 uGym2 DAP TECHNIQUE: After written informed consent was obtained, patient was placed in the supine position on angiographic table. Patency of the right brachial vein was confirmed with ultrasound with image documentation. An appropriate skin site was determined. Skin site was marked. Region was prepped using maximum barrier technique including cap and mask, sterile gown, sterile gloves, large sterile sheet, and Chlorhexidine as cutaneous antisepsis. The region was infiltrated locally with 1% lidocaine. Under real-time ultrasound guidance, the right brachial vein was accessed with a 21 gauge micropuncture needle; the needle tip within the vein was confirmed with ultrasound image documentation. Needle exchanged over a 018 guidewire for a peel-away sheath, through which a 5-French double-lumen power injectable PICC trimmed to 33cm was advanced, positioned with its tip near the cavoatrial junction. Spot chest radiograph confirms appropriate catheter position. Catheter was flushed per protocol and secured externally. The patient tolerated procedure well. COMPLICATIONS: COMPLICATIONS none IMPRESSION: 1. Technically successful five [place] double lumen power injectable PICC placement Electronically Signed    By: Lucrezia Europe M.D.   On: 01/13/2019 16:20        Scheduled Meds: . dexamethasone (DECADRON) injection  6 mg Intravenous Q24H  . fentaNYL (SUBLIMAZE) injection  25 mcg Intravenous Once  . gabapentin  300 mg Oral TID   Continuous Infusions: . ceFEPime (MAXIPIME) IV 2 g (01/14/19 1000)  . remdesivir 100 mg in NS 100 mL 100 mg (01/14/19 1040)  . vancomycin Stopped (01/13/19 2308)     LOS: 2 days    Time spent: 30 minutes with over 50% of the time coordinating the patient's care    Harold Hedge, DO [facility] Pager  [phone]  If 7PM-7AM, please contact night-coverage [email or web address] Password [email or web address] 01/14/2019, 1:41 PM

## 2019-01-14 NOTE — Evaluation (Signed)
Clinical/Bedside Swallow Evaluation Patient Details  Name: Angela Page MRN: [id number] Date of Birth: [birth year]  Today's Date: 01/14/2019 Time: SLP Start Time (ACUTE ONLY): 57 SLP Stop Time (ACUTE ONLY): 1650 SLP Time Calculation (min) (ACUTE ONLY): 20 min  Past Medical History:  Past Medical History:  Diagnosis Date  . Acute on chronic respiratory failure with hypoxia ([place])   . Anasarca 06/10/2016  . Asthma   . At high risk for severe sepsis   . Cocaine use 10/08/2013  . Decubitus ulcer of buttock, unstageable ([place])   . Depression   . Diabetes mellitus without complication ([place])   . GERD (gastroesophageal reflux disease)   . HCAP (healthcare-associated pneumonia) 06/09/2016  . Hepatitis    hx of hepatits frm mono   . Kidney stone   . Lobar pneumonia ([place])   . Overdose of opiate or related narcotic ([place]) 09/02/2014  . Pacemaker   . Pleurisy   . Polysubstance dependence including opioid type drug, episodic abuse ([place]) 10/27/2013  . Protein calorie malnutrition ([place]) 10/31/2015  . Quadriparesis ([place])   . Quadriplegia and quadriparesis ([place])   . Quadriplegia, C5-C7 incomplete ([place]) 10/08/2013  . Stage IV pressure ulcer of sacral region ([place]) 10/31/2015  . Tracheostomy status [facility])    Past Surgical History:  Past Surgical History:  Procedure Laterality Date  . APPENDECTOMY    . BACK SURGERY  10/08/2013   fusion of spine, cervical region  . CARDIAC SURGERY    . CYSTOSCOPY W/ URETERAL STENT PLACEMENT Right 03/09/2017   Procedure: CYSTOSCOPY [name] STENT PLACEMENT;  Surgeon: Irine Seal, MD;  Location: [facility];  Service: Urology;  Laterality: Right;  . CYSTOSCOPY W/ URETERAL STENT REMOVAL Right 07/16/2017   Procedure: CYSTOSCOPY WITH STENT REMOVAL;  Surgeon: Franchot Gallo, MD;  Location: [facility];  Service: Urology;  Laterality: Right;  . CYSTOSCOPY/URETEROSCOPY/HOLMIUM LASER/STENT PLACEMENT Right 07/16/2017   Procedure: CYSTOSCOPY/URETEROSCOPY/HOLMIUM LASER/STENT  PLACEMENT/ALSO STONE EXTRACTION;  Surgeon: Franchot Gallo, MD;  Location: [facility];  Service: Urology;  Laterality: Right;  . GASTROSTOMY  11/23/2015  . I & D EXTREMITY  10/28/2011   Procedure: IRRIGATION AND DEBRIDEMENT EXTREMITY;  Surgeon: Tennis Must, MD;  Location: [place];  Service: Orthopedics;  Laterality: Right;  Irrigation and debridement right middle finger  . INSERTION OF SUPRAPUBIC CATHETER N/A 07/28/2016   Procedure: INSERTION OF SUPRAPUBIC CATHETER;  Surgeon: Franchot Gallo, MD;  Location: [facility];  Service: Urology;  Laterality: N/A;  . IR GENERIC HISTORICAL  11/23/2015   IR GASTROSTOMY TUBE MOD SED 11/23/2015 [facility]  . IR PATIENT EVAL TECH 0-60 MINS  12/18/2017  . IR REPLACE G-TUBE SIMPLE WO FLUORO  12/19/2017  . LITHOTRIPSY  08/2017  . TRACHEOSTOMY     HPI:  48 y.o.femalewith medical history significant for quadriplegia following spinal cord injury with trach and PEG dependency,COPD with chronic hypoxic respiratory failure, chronic hypotension, insulin-dependent diabetes mellitus, chronic pain, sick sinus syndrome with pacer, chronic normocytic anemia, Osteomyelitis of the left fibula, left greater trochanter, distal sacrum and left hip, and decubitus ulcers presentedfrom facility with lethargy; tested positive for COVID-19.  Pt is well known to SLP services- she has chronic dysphagia.  Her swallowing has been evaluated during prior admissions.  She has been able to continue eating in conjunction with PEG feedings.  She uses a PMV for communication. Her last swallow evaluation was  completed in June of 2020- it was recommended that she resume a pureed diet, thin liquids, with plans to advance solids when  she returned to her facility.  (She has never had her dentures with her during admissions here.) No focal CN deficits have been noted in the past, but pt has extensive hardware in her neck and hx of poor laryngeal closure.    Assessment / Plan /  Recommendation Clinical Impression  Pt's swallow function is consistent with findings from prior bedside exams.  She is able to verbalize, but voice is weak and she loses a lot of air through her trach.  Traditional purple PMV cannot be used with HME in place; SLP will bring vent valve and adaptor to floor. Adaptor will allow PMV to be used with HME.    Pt eager to eat.  She drank water from a straw and was assisted with applesauce, which she tolerated well without overt difficulty.  Recommend resuming her typical diet (pureed/thins); please position bed as upright as possible for feeding. Pt's aspiration risk is always present given a chronic suspected component of esophageal dysphagia, dependence on others for feeding, and immobility. No further SLP f/u is needed. Our service will sign off.  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  Pt is well known to SLP services- she has chronic dysphagia.  Her swallowing has been evaluated during prior admissions.  She has been able to continue eating in conjunction with PEG feedings.  She uses a PMV for communication. Her last swallow evaluation was  completed in June of 2020- it  was recommended that she resume a pureed diet, thin liquids, with plans to advance solids when she returned to her facility.  (She has never had her dentures with her during admissions here.) No focal CN deficits have been noted int the past, but pt has extensive hardware in her neck and hx of poor laryngeal closure.  Type of Study: Bedside Swallow Evaluation Previous Swallow Assessment: see HPI Diet Prior to this Study: NPO Temperature Spikes Noted: No Respiratory Status: Trach Trach Size and Type: Uncuffed;#6 History of Recent Intubation: No Behavior/Cognition: Alert;Cooperative;Pleasant mood Oral Cavity Assessment: Within Functional Limits Oral Care Completed by SLP: Recent completion by staff Oral Cavity - Dentition: Missing dentition Vision: Functional for self-feeding Self-Feeding Abilities: Total assist Patient Positioning: Upright in bed Baseline Vocal Quality: Low vocal intensity Volitional Cough: Weak Volitional Swallow: Able to elicit    Oral/Motor/Sensory Function Overall Oral Motor/Sensory Function: Within functional limits   Ice Chips Ice chips: Within functional limits   Thin Liquid Thin Liquid: Within functional limits    Nectar Thick Nectar Thick Liquid: Not tested   Honey Thick Honey Thick Liquid: Not tested   Puree Puree: Within functional limits   Solid     Solid: Not tested      Juan Quam Laurice  01/14/2019,4:07 PM   Jaimi Belle L. Tivis Ringer, [place] Office number [phone]

## 2019-01-14 NOTE — ED Notes (Signed)
Pts suprapubic catheter leaked urine onto pt and linens; pt cleaned, linens changed.

## 2019-01-14 NOTE — Plan of Care (Signed)

## 2019-01-15 ENCOUNTER — Inpatient Hospital Stay (HOSPITAL_COMMUNITY): Payer: Medicare Other

## 2019-01-15 LAB — C-REACTIVE PROTEIN: CRP: 10.6 mg/dL — ABNORMAL HIGH (ref <1.0)

## 2019-01-15 LAB — COMPREHENSIVE METABOLIC PANEL
ALT: 23 U/L (ref 0–44)
AST: 27 U/L (ref 15–41)
Albumin: 1.3 g/dL — ABNORMAL LOW (ref 3.5–5.0)
Alkaline Phosphatase: 1002 U/L — ABNORMAL HIGH (ref 38–126)
Anion gap: 7 (ref 5–15)
BUN: 18 mg/dL (ref 6–20)
CO2: 17 mmol/L — ABNORMAL LOW (ref 22–32)
Calcium: 8 mg/dL — ABNORMAL LOW (ref 8.9–10.3)
Chloride: 108 mmol/L (ref 98–111)
Creatinine, Ser: 0.63 mg/dL (ref 0.44–1.00)
GFR calc Af Amer: >60 mL/min (ref >60)
GFR calc non Af Amer: >60 mL/min (ref >60)
Glucose, Bld: 213 mg/dL — ABNORMAL HIGH (ref 70–99)
Potassium: 3.6 mmol/L (ref 3.5–5.1)
Sodium: 132 mmol/L — ABNORMAL LOW (ref 135–145)
Total Bilirubin: 1.2 mg/dL (ref 0.3–1.2)
Total Protein: 5 g/dL — ABNORMAL LOW (ref 6.5–8.1)

## 2019-01-15 LAB — CBC
HCT: 27 % — ABNORMAL LOW (ref 36.0–46.0)
Hemoglobin: 7.9 g/dL — ABNORMAL LOW (ref 12.0–15.0)
MCH: 26 pg (ref 26.0–34.0)
MCHC: 29.3 g/dL — ABNORMAL LOW (ref 30.0–36.0)
MCV: 88.8 fL (ref 80.0–100.0)
Platelets: 468 10*3/uL — ABNORMAL HIGH (ref 150–400)
RBC: 3.04 MIL/uL — ABNORMAL LOW (ref 3.87–5.11)
RDW: 22 % — ABNORMAL HIGH (ref 11.5–15.5)
WBC: 7.6 10*3/uL (ref 4.0–10.5)
nRBC: 0.3 % — ABNORMAL HIGH (ref 0.0–0.2)

## 2019-01-15 LAB — URINE CULTURE: Culture: >=100000 — AB

## 2019-01-15 LAB — D-DIMER, QUANTITATIVE: D-Dimer, Quant: 1.3 ug/mL-FEU — ABNORMAL HIGH (ref 0.00–0.50)

## 2019-01-15 MED ORDER — GLUCERNA 1.2 CAL PO LIQD
1000.0000 mL | ORAL | Status: AC
  Administered 2019-01-15: 1000 mL
  Filled 2019-01-15: qty 1000

## 2019-01-15 MED ORDER — JUVEN PO PACK
1.0000 | PACK | Freq: Two times a day (BID) | ORAL | Status: DC
  Administered 2019-01-16 – 2019-01-18 (×6): 1 via ORAL
  Filled 2019-01-15 (×7): qty 1

## 2019-01-15 MED ORDER — SODIUM CHLORIDE 0.9 % IV BOLUS
1000.0000 mL | Freq: Once | INTRAVENOUS | Status: AC
  Administered 2019-01-15: 18:00:00 1000 mL via INTRAVENOUS

## 2019-01-15 MED ORDER — PRO-STAT SUGAR FREE PO LIQD
30.0000 mL | Freq: Three times a day (TID) | ORAL | Status: DC
  Administered 2019-01-15 – 2019-01-19 (×11): 30 mL
  Filled 2019-01-15 (×12): qty 30

## 2019-01-15 MED ORDER — PROSIGHT PO TABS
1.0000 | ORAL_TABLET | Freq: Every day | ORAL | Status: DC
  Administered 2019-01-16 – 2019-01-18 (×3): 1 via ORAL
  Filled 2019-01-15 (×4): qty 1

## 2019-01-15 NOTE — Progress Notes (Signed)
Initial Nutrition Assessment  DOCUMENTATION CODES:   Not applicable  INTERVENTION:  Initiate Glucerna 1.2 @ 60 ml/hr x 12 hrs (8pm-8am) via PEG Prostat 30 ml TID via PEG   Provides 1164 kcal, 88 grams of protein, and 583 ml free water from formula  -Encourage po intake  -1 packet Juven BID, each packet provides 95 calories, 2.5 grams of protein (collagen), and 9.8 grams of carbohydrate (3 grams sugar); also contains 7 grams of L-arginine and L-glutamine, 300 mg vitamin C, 15 mg vitamin E, 1.2 mcg vitamin B-12, 9.5 mg zinc, 200 mg calcium, and 1.5 g  Calcium Beta-hydroxy-Beta-methylbutyrate to support wound healing  Ocuvite-luetin daily  NUTRITION DIAGNOSIS:   Increased nutrient needs related to chronic illness, acute illness(acute on chronic hypoxia in the setting of COVID-19 infection with tracheostomy; osteomyelitis of left fibula;greater trochanter, stage IV decubitis ulcer; UTI) as evidenced by estimated needs.  GOAL:   Patient will meet greater than or equal to 90% of their needs   MONITOR:   PO intake, Labs, I & O's, Supplement acceptance, Weight trends, Skin  REASON FOR ASSESSMENT:   Consult Assessment of nutrition requirement/status, Enteral/tube feeding initiation and management  ASSESSMENT:  RD working remotely.  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 In ED, CT abdomen and pelvis showed suprapubic catheter appearing to protrude through posterior bladder wall with fluid in the pelvis; left tibia/fibula x-ray showed osseous demineralization concerning for osteomyelitis with wound care and conservative management and found to be COVID-19 positive.  Patient admitted for acute on chronic hypoxia in the setting of COVID-19 infection with tracheostomy;  cellulitis of the back; osteomyelitis of left fibula, left greater trochanter, distal sacrum and left hip; UTI with suprapubic catheter; stage IV decubitus ulcer; and hypotension due to multiple infectious sources.  Per chart patient with poor prognosis secondary to ongoing infections, palliative consult pending.   Patient resides at a nursing home and has been able to continue eating in conjunction with PEG feedings. She receives: Glucerna 1.2 @ 57 ml/hr x 20 hours (1400-1000) with Promod TID which provides 1668 kcal and 98 grams protein.  Patient evaluated by SLP and recommended DYS1:thin diet order. Patient noted with varied po intake, eating 0-100% (48% average) of the last 3 recorded meals. RD to initiate nocturnal feeds to promote daily po intake.  Current wt 46.5 kg (102.3 lbs) +2 BLE edema per 12/26 RN flowsheet  I/Os: +6598 ml since admit   +10 ml x 24 hrs UOP 700 ml x 24 hrs  Medications reviewed and include: Decadron IV 6 mg every 24 hrs, Gabapentin 300 mg TID Maxipime Remdesivir Vancomycin  Labs: Glucose 86-213 x 24 hrs, Na 132 (L), Alkaline Phosphatase 1002 (H), CRP 10.6 (H), Hgb 7.9 (L)  NUTRITION - FOCUSED PHYSICAL EXAM: Unable to complete at this time, The patient's paper medical record is not available during this visit. It has been removed from this office and cannot be located.  Diet Order:   Diet Order            DIET - DYS 1 Room service appropriate? No; Fluid consistency: Thin  Diet effective now              EDUCATION NEEDS:   No education needs have been identified at this time  Skin:  Skin Assessment: Reviewed RN Assessment(non  pressure wound; left; pretibial; pressure injury; vertebral column; sacrum; left ischial tuberosity)  Last BM:  12/25  Height:   Ht Readings from Last 1 Encounters:  08/11/18 5\' 2"  (1.575 m)    Weight:   Wt Readings from Last 1 Encounters:  12/27/2018 46.5 kg    Ideal Body Weight:  45 kg  BMI:  Body mass index is  18.75 kg/m.  Estimated Nutritional Needs:   Kcal:  1600-1800  Protein:  90-113 (2-2.5 g/kg)  Fluid:  >/= 1.6 L/day   Lajuan Lines, RD, LDN Clinical Nutrition [place] Telephone [phone] After Hours/Weekend Pager: [phone]

## 2019-01-15 NOTE — Progress Notes (Signed)
PROGRESS NOTE    Rori Goar    Code Status: Full Code  OHY:[id number] DOB: Nov 14, 1970 DOA: 01/05/2019  PCP: Default, Provider, MD    Hospital Summary   Angela Page is a 48 y.o. female with medical history significant of Hx of quadriplegia following spinal cord injury with trach and PEG dependency chronically on 3L, chronic diastolic CHF, neurogenic orthostatic hypotension, COPD who presented from facility with concerns of lethargy. Patient is able to nod to yes or no to questions and attempts to answer questions although difficult to hear due to her tracheostomy.  Reportedly she was sent over by staff due to lethargy and concerns for sepsis.  She was found to be hypoxic down to 80% on 4 L and had to be placed on nonrebreather by EMS.    CT abdomen and pelvis showing suprapubic catheter appearing to protrude through posterior bladder wall with fluid in the pelvis.    Urology was consulted, SPT replaced by urology on 12/24, continue gravity drainage, return to normal interval exchanges at facility and CT cystogram to evaluate bladder.   Left tibia/fibula x-ray showing osseous demineralization concerning for osteomyelitis and orthopedic surgery was consulted who recommended ID consult, wound care and conservative management and did not recommend surgery at this time.  Palliative care consulted  A & P   Active Problems:   Quadriplegia ([place])   Decubitus ulcer of sacral region, stage 4 (HCC)   Anemia, iron deficiency   Tracheostomy status (HCC)   PEG (percutaneous endoscopic gastrostomy) status ([place])   Complicated UTI (urinary tract infection)   Sepsis (HCC)   Hyperkalemia  Acute on chronic hypoxia in the setting of COVID infection with tracheostomy At baseline 3 L at bedside, requiring 9-10 L on admission CRP 11.5->10.6-likely acute phase reactant from osteo and Covid -Continue IV Decadron and remdesivir -Follow-up CRP in a.m. -will hold Actemra for  now  Cellulitis of the back  Continue vancomycin and cefepime per ID  Osteomyelitis of the left fibula, left greater trochanter, distal sacrum and left hip Orthopedic surgery consulted: Continue with medical management at this time with no surgical indication.  Recommended wound care and ID consult ID consulted recommended continuing Vanco/cefepime as well as palliative care consult due to multiple comorbidities, multiple hospitalizations and poor overall prognosis -Palliative care consulted, appreciate recommendations -Follow-up with ID regarding antibiotic regimen following palliative care discussion  UTI with suprapubic catheter  Urology consulted and recommended conservative management  Stage 4 decubitus ulcer Palliative care and wound care consult  Hypotension due to multiple infectious source Significantly improved with IV fluids  Hyperkalemia Resolved with Kayexalate x1  Acute on Chronic anemia No obvious bleed Hemoglobin of 7.1->6.1->8.3 status post 1 unit PRBC, now 7.9 -Trend H&H, continue to hold anticoagulation  Quadriplegic due to spinal cord injury Poor prognosis with ongoing infections Palliative care consulted  PEG tube dependent -Consult dietitian  Elevated LFTs ALT/ALT unremarkable Alk phos elevated 1000+, GGT elevated -RUQ US   DVT prophylaxis: Holding DVT prophylaxis pending further work-up above.  SCDs Diet: Dysphagia 1 thin liquids Family Communication: Discussed with patient at bedside Disposition Plan: Barriers to discharge include palliative care discussion and plan for IV antibiotics plus completion of IV remdesivir.  She is now hemodynamically stable on baseline 3 L.  Anticipated discharge early in the week  Consultants  Orthopedic surgery Urology Infectious disease Palliative care  Procedures  PRBC 12/25  Antibiotics   Anti-infectives (From admission, onward)   Start  Dose/Rate Route Frequency Ordered Stop   01/13/19  1000  remdesivir 100 mg in sodium chloride 0.9 % 100 mL IVPB     100 mg 200 mL/hr over 30 Minutes Intravenous Daily 01/11/2019 2243 01/17/19 0959   01/13/19 0200  ceFEPIme (MAXIPIME) 2 g in sodium chloride 0.9 % 100 mL IVPB     2 g 200 mL/hr over 30 Minutes Intravenous Every 8 hours 01/11/2019 1831     01/13/2019 2245  remdesivir 200 mg in sodium chloride 0.9% 250 mL IVPB     200 mg 580 mL/hr over 30 Minutes Intravenous Once 01/01/2019 2243 01/13/19 0210   01/19/2019 1830  vancomycin (VANCOCIN) IVPB 1000 mg/200 mL premix     1,000 mg 200 mL/hr over 60 Minutes Intravenous Every 24 hours 01/03/2019 1831     01/04/2019 1730  vancomycin (VANCOCIN) IVPB 1000 mg/200 mL premix  Status:  Discontinued     1,000 mg 200 mL/hr over 60 Minutes Intravenous  Once 01/07/2019 1717 01/10/2019 1831   01/04/2019 1715  ceFEPIme (MAXIPIME) 2 g in sodium chloride 0.9 % 100 mL IVPB     2 g 200 mL/hr over 30 Minutes Intravenous  Once 12/23/2018 1713 01/15/2019 1837           Subjective   Patient evaluated at bedside resting comfortably watching TV.  I was able to turn down her O2 to baseline 3 L out any significant change in SPO2.  Patient much more conversant today and able to speak more easily.  She denies any complaints at this time with the exception of baseline chronic pains.  Denies any chest pain or shortness of breath, cough, nausea or vomiting.  Objective   Vitals:   01/15/19 0837 01/15/19 0839 01/15/19 1141 01/15/19 1300  BP: 96/66  96/66 (!) 120/99  Pulse:    80  Resp:  16  18  Temp: (!) 97.5 F (36.4 C)   97.6 F (36.4 C)  TempSrc:    Oral  SpO2: 96%   95%  Weight:        Intake/Output Summary (Last 24 hours) at 01/15/2019 1435 Last data filed at 01/15/2019 1414 Gross per 24 hour  Intake 250 ml  Output 700 ml  Net -450 ml   Filed Weights   12/28/2018 1704  Weight: 46.5 kg    Examination:  Physical Exam Vitals and nursing note reviewed.  Constitutional:      Appearance: She is not  toxic-appearing or diaphoretic.  HENT:     Head: Normocephalic.  Eyes:     Conjunctiva/sclera: Conjunctivae normal.  Cardiovascular:     Comments: Sinus on telemetry A full cardiac physical exam was not performed to avoid close exposure and COVID-19 patient Pulmonary:     Effort: Pulmonary effort is normal. No respiratory distress.     Breath sounds: No wheezing.  Musculoskeletal:     Comments: Baseline contractures  Skin:    General: Skin is warm.     Coloration: Skin is not jaundiced.  Neurological:     General: No focal deficit present.     Mental Status: She is alert.  Psychiatric:        Mood and Affect: Mood normal.        Behavior: Behavior normal.     Data Reviewed: I have personally reviewed following labs and imaging studies  CBC: Recent Labs  Lab 01/01/2019 1722 01/13/19 1900 01/14/19 0304 01/14/19 1931 01/15/19 0421  WBC 9.8 15.5* 11.6*  --  7.6  NEUTROABS  6.5  --   --   --   --   HGB 7.1* 6.1* 8.3* 7.6* 7.9*  HCT 25.5* 22.0* 28.2* 26.7* 27.0*  MCV 85.0 84.9 88.1  --  88.8  PLT 678* 516* 459*  --  474*   Basic Metabolic Panel: Recent Labs  Lab 12/28/2018 1722 01/13/19 1900 01/14/19 0304 01/15/19 0421  NA 131* 134* 135 132*  K 5.8* 4.3 4.0 3.6  CL 103 108 106 108  CO2 18* 16* 17* 17*  GLUCOSE 101* 58* 86 213*  BUN _0 CREATININE 0.70 0.75 0.74 0.63  CALCIUM 8.7* 8.3* 8.4* 8.0*  MG  --  1.7  --   --    GFR: Estimated Creatinine Clearance: 63.1 mL/min (by C-G formula based on SCr of 0.63 mg/dL). Liver Function Tests: Recent Labs  Lab 01/07/2019 1722 01/13/19 1900 01/14/19 0304 01/15/19 0421  AST 51* 33 32 27  ALT 38 _1 ALKPHOS 1,621* 1,155* 1,118* 1,002*  BILITOT 1.2 1.3* 1.5* 1.2  PROT 6.9 6.1* 6.0* 5.0*  ALBUMIN 1.7* 1.5* 1.5* 1.3*   No results for input(s): LIPASE, AMYLASE in the last 168 hours. No results for input(s): AMMONIA in the last 168 hours. Coagulation Profile: No results for input(s): INR, PROTIME in the  last 168 hours. Cardiac Enzymes: No results for input(s): CKTOTAL, CKMB, CKMBINDEX, TROPONINI in the last 168 hours. BNP (last 3 results) No results for input(s): PROBNP in the last 8760 hours. HbA1C: No results for input(s): HGBA1C in the last 72 hours. CBG: Recent Labs  Lab 01/13/19 2107  GLUCAP 68*   Lipid Profile: No results for input(s): CHOL, HDL, LDLCALC, TRIG, CHOLHDL, LDLDIRECT in the last 72 hours. Thyroid Function Tests: No results for input(s): TSH, T4TOTAL, FREET4, T3FREE, THYROIDAB in the last 72 hours. Anemia Panel: Recent Labs    01/14/19 0000  FERRITIN 479*   Sepsis Labs: Recent Labs  Lab 01/20/2019 1730  LATICACIDVEN 0.8    Recent Results (from the past 240 hour(s))  Culture, blood (routine x 2)     Status: None (Preliminary result)   Collection Time: 01/11/2019  5:35 PM   Specimen: BLOOD LEFT HAND  Result Value Ref Range Status   Specimen Description BLOOD LEFT HAND  Final   Special Requests   Final    BOTTLES DRAWN AEROBIC AND ANAEROBIC Blood Culture adequate volume   Culture   Final    NO GROWTH 3 DAYS Performed at [facility], [place] [address] [place], [place] [address]    Report Status PENDING  Incomplete  Culture, blood (Routine X 2) w Reflex to ID Panel     Status: None (Preliminary result)   Collection Time: 12/21/2018  6:04 PM   Specimen: BLOOD RIGHT FOREARM  Result Value Ref Range Status   Specimen Description BLOOD RIGHT FOREARM  Final   Special Requests   Final    BOTTLES DRAWN AEROBIC AND ANAEROBIC Blood Culture adequate volume   Culture   Final    NO GROWTH 3 DAYS Performed at [facility], [place] [address], [place], [place] [address]    Report Status PENDING  Incomplete  Respiratory Panel by RT PCR (Flu A&B, Covid) - Nasopharyngeal Swab     Status: Abnormal   Collection Time: 01/17/2019  6:26 PM   Specimen: Nasopharyngeal Swab  Result Value Ref Range Status   SARS Coronavirus 2 by RT PCR POSITIVE (A) NEGATIVE Final     Comment: RESULT  CALLED TO, READ BACK BY AND VERIFIED WITH: P PEICKERT RN 12/26/2018 1948 JDW (NOTE) SARS-CoV-2 target nucleic acids are DETECTED. SARS-CoV-2 RNA is generally detectable in upper respiratory specimens  during the acute phase of infection. Positive results are indicative of the presence of the identified virus, but do not rule out bacterial infection or co-infection with other pathogens not detected by the test. Clinical correlation with patient history and other diagnostic information is necessary to determine patient infection status. The expected result is Negative. Fact Sheet for Patients:  PinkCheek.be Fact Sheet for Healthcare Providers: GravelBags.it This test is not yet approved or cleared by the [place] FDA and  has been authorized for detection and/or diagnosis of SARS-CoV-2 by FDA under an Emergency Use Authorization (EUA).  This EUA will remain in effect (meaning this test can be used) for t he duration of  the COVID-19 declaration under Section 564(b)(1) of the Act, 21 U.S.C. section 360bbb-3(b)(1), unless the authorization is terminated or revoked sooner.    Influenza A by PCR NEGATIVE NEGATIVE Final   Influenza B by PCR NEGATIVE NEGATIVE Final    Comment: (NOTE) The Xpert Xpress SARS-CoV-2/FLU/RSV assay is intended as an aid in  the diagnosis of influenza from Nasopharyngeal swab specimens and  should not be used as a sole basis for treatment. Nasal washings and  aspirates are unacceptable for Xpert Xpress SARS-CoV-2/FLU/RSV  testing. Fact Sheet for Patients: PinkCheek.be Fact Sheet for Healthcare Providers: GravelBags.it This test is not yet approved or cleared by the [place] FDA and  has been authorized for detection and/or diagnosis of SARS-CoV-2 by  FDA under an Emergency Use Authorization (EUA). This EUA will remain  in  effect (meaning this test can be used) for the duration of the  Covid-19 declaration under Section 564(b)(1) of the Act, 21  U.S.C. section 360bbb-3(b)(1), unless the authorization is  terminated or revoked. Performed at [facility], [place] [address], [place], [place] [address]   Urine culture     Status: Abnormal   Collection Time: 01/06/2019  8:22 PM   Specimen: Urine, Catheterized  Result Value Ref Range Status   Specimen Description URINE, CATHETERIZED  Final   Special Requests   Final    NONE Performed at [facility], [address], [place], [place] [address]    Culture >=100,000 COLONIES/mL PROTEUS MIRABILIS (A)  Final   Report Status 01/15/2019 FINAL  Final   Organism ID, Bacteria PROTEUS MIRABILIS (A)  Final      Susceptibility   Proteus mirabilis - MIC*    AMPICILLIN <=2 SENSITIVE Sensitive     CEFAZOLIN 8 SENSITIVE Sensitive     CEFTRIAXONE <=1 SENSITIVE Sensitive     CIPROFLOXACIN >=4 RESISTANT Resistant     GENTAMICIN <=1 SENSITIVE Sensitive     IMIPENEM 4 SENSITIVE Sensitive     NITROFURANTOIN 128 RESISTANT Resistant     TRIMETH/SULFA <=20 SENSITIVE Sensitive     AMPICILLIN/SULBACTAM <=2 SENSITIVE Sensitive     PIP/TAZO <=4 SENSITIVE Sensitive     * >=100,000 COLONIES/mL PROTEUS MIRABILIS         Radiology Studies: CT PELVIS W CONTRAST  Result Date: 01/13/2019 CLINICAL DATA:  Question bladder perforation, abnormal CT with suprapubic catheter questionably extending through bladder wall, history of quadriplegia, diabetes mellitus EXAM: CT PELVIS WITH CONTRAST TECHNIQUE: Multidetector CT imaging of the pelvis was performed using the standard protocol following the bolus administration of intravenous contrast. Dilute contrast was instilled retrograde into the urinary bladder  prior to imaging. CONTRAST:  21m OMNIPAQUE IOHEXOL 300 MG/ML SOLN mixed with 250 cc of sterile saline, volume instilled 75 mL. COMPARISON:  CT abdomen and pelvis 01/02/2019 FINDINGS:  Urinary Tract: Instilled contrast material is identified within the bladder lumen. Reflux of contrast into the distal ureters bilaterally. Mild bladder wall thickening. Mildly irregular posterior wall the bladder which may be related to debris, clot, or wall irregularity. No extravasation of contrast into the extraperitoneal spaces surrounding the bladder nor into the peritoneal cavity. Bowel: Gas, fluid, and stool throughout colon. Bowel loops grossly unremarkable. Vascular/Lymphatic: Atherosclerotic calcifications. No adenopathy. 11 mm LEFT inguinal node. Reproductive:  Atrophic uterus. Other: Small LEFT rectus sheath collection question hematoma unchanged, 3.8 x 1.7 cm. Free fluid in pelvis. No free air. Musculoskeletal: Chronic osseous demineralization. Bone destruction at the sacrum, BILATERAL ischia, and proximal RIGHT femur from decubitus ulcers and suspected osteomyelitis. IMPRESSION: Mild bladder wall thickening without evidence of bladder perforation/contrast extravasation. Vesicoureteral reflux into distal ureters bilaterally. Slightly irregular posterior wall of bladder lumen, could be related to bladder wall thickening or dependent debris. Small amount of nonspecific free pelvic fluid. Small LEFT rectus sheath hematoma unchanged. Osseous destruction from decubitus ulcers and suspected osteomyelitis at the sacrum, BILATERAL ischia, and proximal RIGHT femur. Aortic Atherosclerosis (ICD10-I70.0). Electronically Signed   By: MLavonia DanaM.D.   On: 01/13/2019 16:51   IR PICC PLACEMENT RIGHT >5 YRS INC IMG GUIDE  Result Date: 01/13/2019 CLINICAL DATA:  Osteomyelitis, needs durable venous access EXAM: PICC PLACEMENT WITH ULTRASOUND AND FLUOROSCOPY FLUOROSCOPY TIME:  0.6 minutes; 12 uGym2 DAP TECHNIQUE: After written informed consent was obtained, patient was placed in the supine position on angiographic table. Patency of the right brachial vein was confirmed with ultrasound with image documentation. An  appropriate skin site was determined. Skin site was marked. Region was prepped using maximum barrier technique including cap and mask, sterile gown, sterile gloves, large sterile sheet, and Chlorhexidine as cutaneous antisepsis. The region was infiltrated locally with 1% lidocaine. Under real-time ultrasound guidance, the right brachial vein was accessed with a 21 gauge micropuncture needle; the needle tip within the vein was confirmed with ultrasound image documentation. Needle exchanged over a 018 guidewire for a peel-away sheath, through which a 5-French double-lumen power injectable PICC trimmed to 33cm was advanced, positioned with its tip near the cavoatrial junction. Spot chest radiograph confirms appropriate catheter position. Catheter was flushed per protocol and secured externally. The patient tolerated procedure well. COMPLICATIONS: COMPLICATIONS none IMPRESSION: 1. Technically successful five FPakistandouble lumen power injectable PICC placement Electronically Signed   By: DLucrezia EuropeM.D.   On: 01/13/2019 16:20        Scheduled Meds:  Chlorhexidine Gluconate Cloth  6 each Topical Daily   Chlorhexidine Gluconate Cloth  6 each Topical Daily   dexamethasone (DECADRON) injection  6 mg Intravenous Q24H   fentaNYL (SUBLIMAZE) injection  25 mcg Intravenous Once   gabapentin  300 mg Oral TID   sodium chloride flush  10-40 mL Intracatheter Q12H   Continuous Infusions:  ceFEPime (MAXIPIME) IV 2 g (01/15/19 0842)   remdesivir 100 mg in NS 100 mL 100 mg (01/15/19 1055)   vancomycin 1,000 mg (01/14/19 1838)     LOS: 3 days    Time spent: 26 minutes with over 50% of the time coordinating the patient's care    JHarold Hedge DO [facility] Pager [phone] If 7PM-7AM, please contact night-coverage [email or web address] Password [email or web address] 01/15/2019, 2:35 PM

## 2019-01-15 NOTE — Consult Note (Signed)
WOC Nurse Consult Note: Reason for Consult: Pressure injuries to sacrum, Stage 4 Wound type:Pressure Pressure Injury POA: Yes Measurement: Nursing to measure today and place on flow sheet Wound bed:Red, moist Drainage (amount, consistency, odor) small to moderate amounts of serous exudate Periwound: intact with evidence of previous wound healing Dressing procedure/placement/frequency: Patient uses a silver hydrofiber (Aquacel Ag+) at home, but we will use  twice daily saline dressings here to visualize and stabilize the wounds. She can resume her previous POC at home post discharge. Bilateral pressure redistribution heel boots are added today as patient's boots did not arrive with her and a mattress replacement is ordered.  Orthopedics PA saw the patient 2 days ago for the LLE wound.  NS dressings to this wound are indicated as surgery has no immediate plans for intervention.See that note dated 01/13/19.  [place] nursing team will not follow, but will remain available to this patient, the nursing and medical teams.  Please re-consult if needed. Thanks, Maudie Flakes, MSN, RN, [place], Arther Abbott  Pager# [phone]

## 2019-01-15 NOTE — Progress Notes (Signed)
   01/15/19 1600  Vitals  Temp 97.6 F (36.4 C)  Temp Source Oral  BP (!) 77/55  MAP (mmHg) (!) 64  Pulse Rate 80  ECG Heart Rate 80  Resp 14  Oxygen Therapy  SpO2 94 %  MEWS Score  MEWS RR 0  MEWS Pulse 0  MEWS Systolic 2  MEWS LOC 0  MEWS Temp 0  MEWS Score 2  MEWS Score Color Yellow  MEWS Assessment  Is this an acute change? Yes  MEWS guidelines implemented *See Row Information* Yellow   Dr. Neysa Bonito notified of change in BP. Pt asymptomatic and 'feels fine'. No new orders at this time. Will continue to monitor.

## 2019-01-16 ENCOUNTER — Inpatient Hospital Stay (HOSPITAL_COMMUNITY): Payer: Medicare Other

## 2019-01-16 DIAGNOSIS — F419 Anxiety disorder, unspecified: Secondary | ICD-10-CM

## 2019-01-16 DIAGNOSIS — U071 COVID-19: Secondary | ICD-10-CM

## 2019-01-16 DIAGNOSIS — M869 Osteomyelitis, unspecified: Secondary | ICD-10-CM

## 2019-01-16 DIAGNOSIS — R7989 Other specified abnormal findings of blood chemistry: Secondary | ICD-10-CM

## 2019-01-16 DIAGNOSIS — Z515 Encounter for palliative care: Secondary | ICD-10-CM

## 2019-01-16 DIAGNOSIS — J1289 Other viral pneumonia: Secondary | ICD-10-CM

## 2019-01-16 LAB — COMPREHENSIVE METABOLIC PANEL
ALT: 24 U/L (ref 0–44)
AST: 36 U/L (ref 15–41)
Albumin: 1.4 g/dL — ABNORMAL LOW (ref 3.5–5.0)
Alkaline Phosphatase: 957 U/L — ABNORMAL HIGH (ref 38–126)
Anion gap: 6 (ref 5–15)
BUN: 20 mg/dL (ref 6–20)
CO2: 18 mmol/L — ABNORMAL LOW (ref 22–32)
Calcium: 8.1 mg/dL — ABNORMAL LOW (ref 8.9–10.3)
Chloride: 111 mmol/L (ref 98–111)
Creatinine, Ser: 0.62 mg/dL (ref 0.44–1.00)
GFR calc Af Amer: >60 mL/min (ref >60)
GFR calc non Af Amer: >60 mL/min (ref >60)
Glucose, Bld: 153 mg/dL — ABNORMAL HIGH (ref 70–99)
Potassium: 3.3 mmol/L — ABNORMAL LOW (ref 3.5–5.1)
Sodium: 135 mmol/L (ref 135–145)
Total Bilirubin: 1 mg/dL (ref 0.3–1.2)
Total Protein: 5.9 g/dL — ABNORMAL LOW (ref 6.5–8.1)

## 2019-01-16 LAB — AMMONIA: Ammonia: 59 umol/L — ABNORMAL HIGH (ref 9–35)

## 2019-01-16 LAB — GLUCOSE, CAPILLARY: Glucose-Capillary: 162 mg/dL — ABNORMAL HIGH (ref 70–99)

## 2019-01-16 LAB — CBC
HCT: 27.9 % — ABNORMAL LOW (ref 36.0–46.0)
Hemoglobin: 8.2 g/dL — ABNORMAL LOW (ref 12.0–15.0)
MCH: 26.3 pg (ref 26.0–34.0)
MCHC: 29.4 g/dL — ABNORMAL LOW (ref 30.0–36.0)
MCV: 89.4 fL (ref 80.0–100.0)
Platelets: 460 10*3/uL — ABNORMAL HIGH (ref 150–400)
RBC: 3.12 MIL/uL — ABNORMAL LOW (ref 3.87–5.11)
RDW: 22.7 % — ABNORMAL HIGH (ref 11.5–15.5)
WBC: 6.1 10*3/uL (ref 4.0–10.5)
nRBC: 0 % (ref 0.0–0.2)

## 2019-01-16 LAB — D-DIMER, QUANTITATIVE: D-Dimer, Quant: 1.35 ug/mL-FEU — ABNORMAL HIGH (ref 0.00–0.50)

## 2019-01-16 MED ORDER — LACTULOSE 10 GM/15ML PO SOLN
20.0000 g | Freq: Two times a day (BID) | ORAL | Status: DC
  Administered 2019-01-16 – 2019-01-17 (×2): 20 g via ORAL
  Filled 2019-01-16 (×2): qty 30

## 2019-01-16 MED ORDER — POTASSIUM CHLORIDE CRYS ER 20 MEQ PO TBCR
20.0000 meq | EXTENDED_RELEASE_TABLET | Freq: Once | ORAL | Status: AC
  Administered 2019-01-16: 20 meq via ORAL
  Filled 2019-01-16: qty 1

## 2019-01-16 MED ORDER — DOXYCYCLINE HYCLATE 100 MG PO TABS
100.0000 mg | ORAL_TABLET | Freq: Two times a day (BID) | ORAL | Status: DC
  Administered 2019-01-16 – 2019-01-18 (×4): 100 mg via ORAL
  Filled 2019-01-16 (×4): qty 1

## 2019-01-16 MED ORDER — LORAZEPAM 1 MG PO TABS
1.0000 mg | ORAL_TABLET | Freq: Two times a day (BID) | ORAL | Status: DC | PRN
  Administered 2019-01-16 (×2): 1 mg via ORAL
  Filled 2019-01-16 (×2): qty 1

## 2019-01-16 MED ORDER — ENOXAPARIN SODIUM 40 MG/0.4ML ~~LOC~~ SOLN
40.0000 mg | SUBCUTANEOUS | Status: DC
  Administered 2019-01-16 – 2019-01-17 (×2): 40 mg via SUBCUTANEOUS
  Filled 2019-01-16 (×2): qty 0.4

## 2019-01-16 MED ORDER — HYDROXYZINE HCL 25 MG PO TABS
25.0000 mg | ORAL_TABLET | Freq: Once | ORAL | Status: AC
  Administered 2019-01-16: 25 mg via ORAL
  Filled 2019-01-16: qty 1

## 2019-01-16 NOTE — Progress Notes (Addendum)
PROGRESS NOTE    Angela Page    Code Status: Full Code  EXH:[id number] DOB: June 25, 1970 DOA: 01/09/2019  PCP: Default, Provider, MD    Hospital Summary   Angela Page is a 48 y.o. female with medical history significant of Hx of quadriplegia following spinal cord injury with trach and PEG dependency chronically on 3L, chronic diastolic CHF, neurogenic orthostatic hypotension, COPD who presented from facility with concerns of lethargy. Patient is able to nod to yes or no to questions and attempts to answer questions although difficult to hear due to her tracheostomy.  Reportedly she was sent over by staff due to lethargy and concerns for sepsis.  She was found to be hypoxic down to 80% on 4 L and had to be placed on nonrebreather by EMS.    CT abdomen and pelvis showing suprapubic catheter appearing to protrude through posterior bladder wall with fluid in the pelvis.    Urology was consulted, SPT replaced by urology on 12/24, continue gravity drainage, return to normal interval exchanges at facility and CT cystogram to evaluate bladder.   Left tibia/fibula x-ray showing osseous demineralization concerning for osteomyelitis and orthopedic surgery was consulted who recommended ID consult, wound care and conservative management and did not recommend surgery at this time.  Palliative care consulted  A & P   Active Problems:   Quadriplegia ([place])   Decubitus ulcer of sacral region, stage 4 (HCC)   Anemia, iron deficiency   Tracheostomy status (HCC)   PEG (percutaneous endoscopic gastrostomy) status ([place])   Complicated UTI (urinary tract infection)   Sepsis (HCC)   Hyperkalemia  Acute on chronic hypoxia in the setting of COVID infection with tracheostomy O2 requirement increased, currently 6 L CRP 11.5->10.6-likely acute phase reactant from osteo and Covid CXR: Developing patchy airspace opacities in left midlung and bilateral bases representing multifocal viral  pneumonia. Chest x-ray also personally read by me -Continue IV Decadron and remdesivir -Add doxycycline for atypical coverage -will hold Actemra for now -Consider diuresis she received multiple boluses of IV fluids  Elevated ammonia -Restart home lactulose  Anxiety Insomnia on Ambien at home -Ativan 1 mg twice daily as needed  Cellulitis of the back  Continue vancomycin and cefepime per ID  Osteomyelitis of the left fibula, left greater trochanter, distal sacrum and left hip Orthopedic surgery consulted: Continue with medical management at this time with no surgical indication.  Recommended wound care and ID consult ID consulted recommended continuing Vanco/cefepime as well as palliative care consult due to multiple comorbidities, multiple hospitalizations and poor overall prognosis -Palliative care consulted, appreciate recommendations -Follow-up with ID regarding antibiotic regimen following palliative care discussion  Proteus UTI with suprapubic catheter  Urology consulted and recommended conservative management -Continue current antibiotics pending ID recommendations  Stage 4 decubitus ulcer Palliative care and wound care consult  Hypotension due to multiple infectious source Significantly improved with IV fluids  Hyperkalemia Resolved with Kayexalate x1  Acute on Chronic anemia Unclear etiology Hemoglobin of 7.1->6.1->8.3 status post 1 unit PRBC -Trend H&H, continue to hold anticoagulation  Quadriplegic due to spinal cord injury Poor prognosis with ongoing infections -Palliative care consulted  PEG tube dependent -Consult dietitian  Elevated LFTs ALT/ALT unremarkable Alk phos elevated 1000+, GGT elevated -RUQ US   DVT prophylaxis: Lovenox SCDs Diet: Dysphagia 1 thin liquids Family Communication: Discussed with patient at bedside Disposition Plan: Barriers to discharge include palliative care discussion and plan for IV antibiotics plus completion  of IV remdesivir.   Consultants  Orthopedic surgery Urology Infectious disease Palliative care  Procedures  PRBC 12/25  Antibiotics   Anti-infectives (From admission, onward)   Start     Dose/Rate Route Frequency Ordered Stop   01/16/19 2200  doxycycline (VIBRA-TABS) tablet 100 mg     100 mg Oral Every 12 hours 01/16/19 1703 01/21/19 2159   01/13/19 1000  remdesivir 100 mg in sodium chloride 0.9 % 100 mL IVPB     100 mg 200 mL/hr over 30 Minutes Intravenous Daily 01/19/2019 2243 01/16/19 0902   01/13/19 0200  ceFEPIme (MAXIPIME) 2 g in sodium chloride 0.9 % 100 mL IVPB     2 g 200 mL/hr over 30 Minutes Intravenous Every 8 hours 01/08/2019 1831     12/22/2018 2245  remdesivir 200 mg in sodium chloride 0.9% 250 mL IVPB     200 mg 580 mL/hr over 30 Minutes Intravenous Once 01/18/2019 2243 01/13/19 0210   01/10/2019 1830  vancomycin (VANCOCIN) IVPB 1000 mg/200 mL premix     1,000 mg 200 mL/hr over 60 Minutes Intravenous Every 24 hours 01/02/2019 1831     01/20/2019 1730  vancomycin (VANCOCIN) IVPB 1000 mg/200 mL premix  Status:  Discontinued     1,000 mg 200 mL/hr over 60 Minutes Intravenous  Once 01/18/2019 1717 12/31/2018 1831   12/27/2018 1715  ceFEPIme (MAXIPIME) 2 g in sodium chloride 0.9 % 100 mL IVPB     2 g 200 mL/hr over 30 Minutes Intravenous  Once 12/29/2018 1713 12/21/2018 1837           Subjective   Reports she did not sleep well last night due to anxiety and increased shortness of breath.  Given 1 mg Ativan this morning which improved her anxiety somewhat her shortness of breath.  Typically takes Ambien at night denies chest pain, fever, chills, night sweats no other complaints.  Objective   Vitals:   01/16/19 0920 01/16/19 1200 01/16/19 1456 01/16/19 1602  BP:  91/73  106/90  Pulse: 81 82 81 85  Resp: _0 Temp:  97.6 F (36.4 C)    TempSrc:  Oral    SpO2: 98% 98% 97% 95%  Weight:        Intake/Output Summary (Last 24 hours) at 01/16/2019 1712 Last data filed  at 01/16/2019 1200 Gross per 24 hour  Intake 2011.53 ml  Output 375 ml  Net 1636.53 ml   Filed Weights   01/08/2019 1704  Weight: 46.5 kg    Examination:  Physical Exam Vitals and nursing note reviewed.  Constitutional:      General: She is not in acute distress. HENT:     Head: Normocephalic.  Eyes:     Conjunctiva/sclera: Conjunctivae normal.  Cardiovascular:     Rate and Rhythm: Normal rate and regular rhythm.     Comments: Sinus on telemetry A full cardiac physical exam was not performed to avoid close exposure and COVID-19 patient Pulmonary:     Effort: Pulmonary effort is normal. No respiratory distress.     Breath sounds: No wheezing.  Musculoskeletal:     Comments: Baseline contractures  Skin:    Coloration: Skin is not jaundiced.  Neurological:     Mental Status: She is alert. Mental status is at baseline.  Psychiatric:        Mood and Affect: Mood is anxious.        Behavior: Behavior normal.     Data Reviewed: I have personally reviewed following labs and imaging studies  CBC: Recent Labs  Lab 01/03/2019 1722 01/13/19 1900 01/14/19 0304 01/14/19 1931 01/15/19 0421 01/16/19 0310  WBC 9.8 15.5* 11.6*  --  7.6 6.1  NEUTROABS 6.5  --   --   --   --   --   HGB 7.1* 6.1* 8.3* 7.6* 7.9* 8.2*  HCT 25.5* 22.0* 28.2* 26.7* 27.0* 27.9*  MCV 85.0 84.9 88.1  --  88.8 89.4  PLT 678* 516* 459*  --  468* 697*   Basic Metabolic Panel: Recent Labs  Lab 01/06/2019 1722 01/13/19 1900 01/14/19 0304 01/15/19 0421 01/16/19 0310  NA 131* 134* 135 132* 135  K 5.8* 4.3 4.0 3.6 3.3*  CL 103 108 106 108 111  CO2 18* 16* 17* 17* 18*  GLUCOSE 101* 58* 86 213* 153*  BUN _0 CREATININE 0.70 0.75 0.74 0.63 0.62  CALCIUM 8.7* 8.3* 8.4* 8.0* 8.1*  MG  --  1.7  --   --   --    GFR: Estimated Creatinine Clearance: 63.1 mL/min (by C-G formula based on SCr of 0.62 mg/dL). Liver Function Tests: Recent Labs  Lab 01/16/2019 1722 01/13/19 1900 01/14/19 0304  01/15/19 0421 01/16/19 0310  AST 51* 33 32 27 36  ALT 38 _1 ALKPHOS 1,621* 1,155* 1,118* 1,002* 957*  BILITOT 1.2 1.3* 1.5* 1.2 1.0  PROT 6.9 6.1* 6.0* 5.0* 5.9*  ALBUMIN 1.7* 1.5* 1.5* 1.3* 1.4*   No results for input(s): LIPASE, AMYLASE in the last 168 hours. Recent Labs  Lab 01/16/19 1010  AMMONIA 59*   Coagulation Profile: No results for input(s): INR, PROTIME in the last 168 hours. Cardiac Enzymes: No results for input(s): CKTOTAL, CKMB, CKMBINDEX, TROPONINI in the last 168 hours. BNP (last 3 results) No results for input(s): PROBNP in the last 8760 hours. HbA1C: No results for input(s): HGBA1C in the last 72 hours. CBG: Recent Labs  Lab 01/13/19 2107 01/16/19 0629  GLUCAP 68* 162*   Lipid Profile: No results for input(s): CHOL, HDL, LDLCALC, TRIG, CHOLHDL, LDLDIRECT in the last 72 hours. Thyroid Function Tests: No results for input(s): TSH, T4TOTAL, FREET4, T3FREE, THYROIDAB in the last 72 hours. Anemia Panel: Recent Labs    01/14/19 0000  FERRITIN 479*   Sepsis Labs: Recent Labs  Lab 12/21/2018 1730  LATICACIDVEN 0.8    Recent Results (from the past 240 hour(s))  Culture, blood (routine x 2)     Status: None (Preliminary result)   Collection Time: 12/30/2018  5:35 PM   Specimen: BLOOD LEFT HAND  Result Value Ref Range Status   Specimen Description BLOOD LEFT HAND  Final   Special Requests   Final    BOTTLES DRAWN AEROBIC AND ANAEROBIC Blood Culture adequate volume   Culture   Final    NO GROWTH 4 DAYS Performed at [facility], [place] [address], [place], [place] [address]    Report Status PENDING  Incomplete  Culture, blood (Routine X 2) w Reflex to ID Panel     Status: None (Preliminary result)   Collection Time: 01/06/2019  6:04 PM   Specimen: BLOOD RIGHT FOREARM  Result Value Ref Range Status   Specimen Description BLOOD RIGHT FOREARM  Final   Special Requests   Final    BOTTLES DRAWN AEROBIC AND ANAEROBIC Blood Culture adequate  volume   Culture   Final    NO GROWTH 4 DAYS Performed at [facility], [place] [address], [place], [place] [address]  Report Status PENDING  Incomplete  Respiratory Panel by RT PCR (Flu A&B, Covid) - Nasopharyngeal Swab     Status: Abnormal   Collection Time: 01/16/2019  6:26 PM   Specimen: Nasopharyngeal Swab  Result Value Ref Range Status   SARS Coronavirus 2 by RT PCR POSITIVE (A) NEGATIVE Final    Comment: RESULT CALLED TO, READ BACK BY AND VERIFIED WITH: P PEICKERT RN 01/02/2019 1948 JDW (NOTE) SARS-CoV-2 target nucleic acids are DETECTED. SARS-CoV-2 RNA is generally detectable in upper respiratory specimens  during the acute phase of infection. Positive results are indicative of the presence of the identified virus, but do not rule out bacterial infection or co-infection with other pathogens not detected by the test. Clinical correlation with patient history and other diagnostic information is necessary to determine patient infection status. The expected result is Negative. Fact Sheet for Patients:  PinkCheek.be Fact Sheet for Healthcare Providers: GravelBags.it This test is not yet approved or cleared by the [place] FDA and  has been authorized for detection and/or diagnosis of SARS-CoV-2 by FDA under an Emergency Use Authorization (EUA).  This EUA will remain in effect (meaning this test can be used) for t he duration of  the COVID-19 declaration under Section 564(b)(1) of the Act, 21 U.S.C. section 360bbb-3(b)(1), unless the authorization is terminated or revoked sooner.    Influenza A by PCR NEGATIVE NEGATIVE Final   Influenza B by PCR NEGATIVE NEGATIVE Final    Comment: (NOTE) The Xpert Xpress SARS-CoV-2/FLU/RSV assay is intended as an aid in  the diagnosis of influenza from Nasopharyngeal swab specimens and  should not be used as a sole basis for treatment. Nasal washings and  aspirates are  unacceptable for Xpert Xpress SARS-CoV-2/FLU/RSV  testing. Fact Sheet for Patients: PinkCheek.be Fact Sheet for Healthcare Providers: GravelBags.it This test is not yet approved or cleared by the [place] FDA and  has been authorized for detection and/or diagnosis of SARS-CoV-2 by  FDA under an Emergency Use Authorization (EUA). This EUA will remain  in effect (meaning this test can be used) for the duration of the  Covid-19 declaration under Section 564(b)(1) of the Act, 21  U.S.C. section 360bbb-3(b)(1), unless the authorization is  terminated or revoked. Performed at [facility], [place] [address], [place], [place] [address]   Urine culture     Status: Abnormal   Collection Time: 12/26/2018  8:22 PM   Specimen: Urine, Catheterized  Result Value Ref Range Status   Specimen Description URINE, CATHETERIZED  Final   Special Requests   Final    NONE Performed at [facility], [address], [place],  [address]    Culture >=100,000 COLONIES/mL PROTEUS MIRABILIS (A)  Final   Report Status 01/15/2019 FINAL  Final   Organism ID, Bacteria PROTEUS MIRABILIS (A)  Final      Susceptibility   Proteus mirabilis - MIC*    AMPICILLIN <=2 SENSITIVE Sensitive     CEFAZOLIN 8 SENSITIVE Sensitive     CEFTRIAXONE <=1 SENSITIVE Sensitive     CIPROFLOXACIN >=4 RESISTANT Resistant     GENTAMICIN <=1 SENSITIVE Sensitive     IMIPENEM 4 SENSITIVE Sensitive     NITROFURANTOIN 128 RESISTANT Resistant     TRIMETH/SULFA <=20 SENSITIVE Sensitive     AMPICILLIN/SULBACTAM <=2 SENSITIVE Sensitive     PIP/TAZO <=4 SENSITIVE Sensitive     * >=100,000 COLONIES/mL PROTEUS MIRABILIS         Radiology Studies: DG CHEST PORT 1  VIEW  Result Date: 01/16/2019 CLINICAL DATA:  48 year old female with dyspnea, COVID-19 and sepsis EXAM: PORTABLE CHEST 1 VIEW COMPARISON:  Prior chest x-ray 01/04/2019 FINDINGS: Tracheostomy tube remains  in good position. The tip of the tube is midline and at the level of the clavicles. Left subclavian approach cardiac rhythm maintenance device remains in unchanged position. Right upper extremity PICC has been placed. The tip overlies the cavoatrial junction. Cardiac and mediastinal contours are within normal limits. Inspiratory volumes are low. Small right pleural effusion. Patchy interstitial and airspace opacities present in both lower lungs and in the left mid lung. IMPRESSION: 1. Lower inspiratory volumes. 2. Developing patchy airspace opacities in the left mid lung and bilateral bases likely representing multifocal viral pneumonia. 3. Small right pleural effusion. 4. New right upper extremity PICC. Catheter tip overlies the superior cavoatrial junction. 5. Other support apparatus in stable and satisfactory position. Electronically Signed   By: Jacqulynn Cadet M.D.   On: 01/16/2019 13:49   US Abdomen Limited RUQ  Result Date: 01/15/2019 CLINICAL DATA:  Elevated alkaline phosphatase, history quadriplegia, substance abuse, CHF, COPD EXAM: ULTRASOUND ABDOMEN LIMITED RIGHT UPPER QUADRANT COMPARISON:  CT abdomen and pelvis 01/10/2019 FINDINGS: Gallbladder: Well distended with mildly thickened gallbladder wall. Small amount of dependent sludge. No shadowing calculi or sonographic Murphy sign. Common bile duct: Diameter: 3 mm, normal Liver: Normal echogenicity without mass. Portal vein is patent on color Doppler imaging with normal direction of blood flow towards the liver. Other: Small amount of perihepatic free fluid. IMPRESSION: Small amount of perihepatic free fluid. Mild gallbladder wall thickening, nonspecific in the setting of ascites. No others RIGHT upper quadrant sonographic abnormalities identified. Electronically Signed   By: Lavonia Dana M.D.   On: 01/15/2019 22:04        Scheduled Meds: . Chlorhexidine Gluconate Cloth  6 each Topical Daily  . Chlorhexidine Gluconate Cloth  6 each Topical  Daily  . dexamethasone (DECADRON) injection  6 mg Intravenous Q24H  . doxycycline  100 mg Oral Q12H  . feeding supplement (PRO-STAT SUGAR FREE 64)  30 mL Per Tube TID  . fentaNYL (SUBLIMAZE) injection  25 mcg Intravenous Once  . gabapentin  300 mg Oral TID  . multivitamin  1 tablet Oral Daily  . nutrition supplement (JUVEN)  1 packet Oral BID BM  . sodium chloride flush  10-40 mL Intracatheter Q12H   Continuous Infusions: . ceFEPime (MAXIPIME) IV 2 g (01/16/19 0956)  . vancomycin 1,000 mg (01/15/19 1905)     LOS: 4 days    Time spent: 1mnutes with over 50% of the time coordinating the patient's care    JHarold Hedge DO [facility] Pager [phone] If 7PM-7AM, please contact night-coverage [email or web address] Password [facility]12/27/2020, 5:12 PM

## 2019-01-16 NOTE — Consult Note (Signed)
Consultation Note Date: 01/16/2019   Patient Name: Angela Page  DOB: 04/10/1970  MRN: [id number]  Age / Sex: 48 y.o., female  PCP: Default, Provider, MD Referring Physician: Harold Hedge, MD  Reason for Consultation: Establishing goals of care and Psychosocial/spiritual support  HPI/Patient Profile: 48 y.o. female  with past medical history of quadriplegia, tracheostomy, PEG, suprapubic catheter, stage IV sacral decubitus and osteomyelitis in several bones who was admitted on 12/22/2018 with COVID 19 infection and cellulitis on her back.  Imaging showed her suprapubic catheter had punctured her posterior bladder wall leaving fluid in the pelvis and osteomyelitis had progressed into her fibula.   Clinical Assessment and Goals of Care:  I have reviewed medical records including EPIC notes, labs and imaging, received report from Dr. Neysa Bonito, and then spoke on the phone with her mother, [name]  to discuss diagnosis prognosis, [place], EOL wishes, disposition and options.  I introduced Palliative Medicine as specialized medical care for people living with serious illness. It focuses on providing relief from the symptoms and stress of a serious illness. The goal is to improve quality of life for both the patient and the family.  [name] was happy to receive the call with an update about Yuka's hospitalization.  We discussed her hypoxia, bladder puncture, wounds and infections including multiple sites of osteo.  Mrs. Oletta Lamas was quite concerned.  I attempted to elicit values and goals of care important to the patient.  [name] stated that if there is anything that can be done to "keep Ardice going" that is what she would want.  Roza has a brother and an 69 year old daughter named [place].  She does not want to leave her daughter.  Her quality of life at the NH is good.  Her mother states that she is  well cared for there.  [name] is able to face time with Yoni at the [facility] which brings joy to both of them.  We talked about Advanced Directives.  Elim has a HCPOA that names her mother as primary and her brother as back up.  Elsy has hand written a note that states she DOES NOT WANT LIFE SUPPORT IF THERE IS NO CHANCE OF RECOVERY.  [name] acknowledged that and stated but if there is a chance of recovery she does want life support.  I expressed to [name] that I was worried given her frailty and multiple infections we are approaching the point where if she arrested there would be no chance of recovery.  [name] listened and appropriately said I don't want to lose my daughter.  I took the IPAD to 5W to allow [name] and Milina to face time giving [name] the ability to assess Mally's spirits and feelings.    Primary Decision Maker:  PATIENT  Her HCPOA is her Mother, [name].    SUMMARY OF RECOMMENDATIONS    Code Status/Advance Care Planning:  Full code   Symptom Management:   Per primary  Additional Recommendations (Limitations, Scope, Preferences):  Full Scope Treatment  Palliative  Prophylaxis:   Palliative Wound Care  Psycho-social/Spiritual:   Desire for further Chaplaincy support: not discussed.   Prognosis:  Concerned for acute decline and death due to COVID 19, multiple wounds and multiple infections.    Discharge Planning: [place] for rehab with Palliative care service follow-up      Primary Diagnoses: Present on Admission: . Sepsis ([place]) . Quadriplegia ([place]) . Decubitus ulcer of sacral region, stage 4 ([place]) . Anemia, iron deficiency . Complicated UTI (urinary tract infection)   I have reviewed the medical record, interviewed the patient and family, and examined the patient. The following aspects are pertinent.  Past Medical History:  Diagnosis Date  . Acute on chronic respiratory failure with hypoxia ([place])   . Anasarca  06/10/2016  . Asthma   . At high risk for severe sepsis   . Cocaine use 10/08/2013  . Decubitus ulcer of buttock, unstageable ([place])   . Depression   . Diabetes mellitus without complication ([place])   . GERD (gastroesophageal reflux disease)   . HCAP (healthcare-associated pneumonia) 06/09/2016  . Hepatitis    hx of hepatits frm mono   . Kidney stone   . Lobar pneumonia ([place])   . Overdose of opiate or related narcotic ([place]) 09/02/2014  . Pacemaker   . Pleurisy   . Polysubstance dependence including opioid type drug, episodic abuse ([place]) 10/27/2013  . Protein calorie malnutrition ([place]) 10/31/2015  . Quadriparesis ([place])   . Quadriplegia and quadriparesis ([place])   . Quadriplegia, C5-C7 incomplete ([place]) 10/08/2013  . Stage IV pressure ulcer of sacral region ([place]) 10/31/2015  . Tracheostomy status ([place])    Social History   Socioeconomic History  . Marital status: Single    Spouse name: Not on file  . Number of children: Not on file  . Years of education: Not on file  . Highest education level: Not on file  Occupational History  . Not on file  Tobacco Use  . Smoking status: Former Smoker    Packs/day: 1.00    Years: 25.00    Pack years: 25.00    Types: Cigarettes  . Smokeless tobacco: Never Used  Substance and Sexual Activity  . Alcohol use: Never  . Drug use: No  . Sexual activity: Never    Birth control/protection: None  Other Topics Concern  . Not on file  Social History Narrative   ** Merged History Encounter **       Social Determinants of Health   Financial Resource Strain:   . Difficulty of Paying Living Expenses: Not on file  Food Insecurity:   . Worried About Charity fundraiser in the Last Year: Not on file  . Ran Out of Food in the Last Year: Not on file  Transportation Needs:   . Lack of Transportation (Medical): Not on file  . Lack of Transportation (Non-Medical): Not on file  Physical Activity:   . Days of Exercise per Week: Not on file  . Minutes of  Exercise per Session: Not on file  Stress:   . Feeling of Stress : Not on file  Social Connections:   . Frequency of Communication with Friends and Family: Not on file  . Frequency of Social Gatherings with Friends and Family: Not on file  . Attends Religious Services: Not on file  . Active Member of Clubs or Organizations: Not on file  . Attends Archivist Meetings: Not on file  . Marital Status: Not on file   Family History  Problem Relation Age of Onset  . Hypertension Maternal Uncle   . Kidney failure Maternal Uncle   . Colon cancer Mother   . Lung cancer Father   . Hypertension Brother   . Kidney failure Maternal Aunt   . Kidney failure Maternal Uncle   . Colon cancer Maternal Grandfather   . Esophageal cancer Neg Hx    Scheduled Meds: . Chlorhexidine Gluconate Cloth  6 each Topical Daily  . Chlorhexidine Gluconate Cloth  6 each Topical Daily  . dexamethasone (DECADRON) injection  6 mg Intravenous Q24H  . feeding supplement (PRO-STAT SUGAR FREE 64)  30 mL Per Tube TID  . fentaNYL (SUBLIMAZE) injection  25 mcg Intravenous Once  . gabapentin  300 mg Oral TID  . multivitamin  1 tablet Oral Daily  . nutrition supplement (JUVEN)  1 packet Oral BID BM  . sodium chloride flush  10-40 mL Intracatheter Q12H   Continuous Infusions: . ceFEPime (MAXIPIME) IV 2 g (01/16/19 0956)  . vancomycin 1,000 mg (01/15/19 1905)   PRN Meds:.LORazepam, Melatonin, oxyCODONE, sodium chloride flush Allergies  Allergen Reactions  . Penicillins Hives    Has patient had a PCN reaction causing immediate rash, facial/tongue/throat swelling, SOB or lightheadedness with hypotension: Yes Has patient had a PCN reaction causing severe rash involving mucus membranes or skin necrosis: Yes Has patient had a PCN reaction that required hospitalization No Has patient had a PCN reaction occurring within the last 10 years: No-childhood allergy If all of the above answers are "NO", then may proceed with  Cephalosporin  Tolerated cephalosporins in past  . Erythromycin Nausea And Vomiting  . Nitrofurantoin Monohyd Macro Nausea And Vomiting  . Oxybutynin Other (See Comments)    Dries mouth out      Vital Signs: BP 106/90   Pulse 85   Temp 97.6 F (36.4 C) (Oral)   Resp 20   Wt 46.5 kg   LMP 05/27/2016 (Within Days) Comment: Irregular periods since October 2015.  SpO2 95%   BMI 18.75 kg/m  Pain Scale: 0-10   Pain Score: 7    SpO2: SpO2: 95 % O2 Device:SpO2: 95 % O2 Flow Rate: .O2 Flow Rate (L/min): 6 L/min  IO: Intake/output summary:   Intake/Output Summary (Last 24 hours) at 01/16/2019 1653 Last data filed at 01/16/2019 1200 Gross per 24 hour  Intake 2011.53 ml  Output 375 ml  Net 1636.53 ml    LBM: Last BM Date: 01/15/19 Baseline Weight: Weight: 46.5 kg Most recent weight: Weight: 46.5 kg     Palliative Assessment/Data:20%     Time In: 4:00 Time Out: 5:10 Time Total: 70 min  Visit consisted of counseling and education dealing with the complex and emotionally intense issues surrounding the need for palliative care and symptom management in the setting of serious and potentially life-threatening illness. Greater than 50%  of this time was spent counseling and coordinating care related to the above assessment and plan.  Signed by: Florentina Jenny, PA-C Palliative Medicine Pager: [phone]  Please contact Palliative Medicine Team phone at [phone] for questions and concerns.  For individual provider: See Shea Evans

## 2019-01-16 NOTE — Progress Notes (Signed)
Pharmacy Antibiotic Note  Angela Page is a 48 y.o. female admitted on 01/13/2019 with sepsis.  Pharmacy has been consulted for Vancomycin and cefepime dosing.  Weight: 102 lb 8 oz (46.5 kg)  Temp (24hrs), Avg:97.9 F (36.6 C), Min:97.6 F (36.4 C), Max:98.6 F (37 C)  Recent Labs  Lab 01/14/2019 1722 01/17/2019 1730 01/13/19 1900 01/14/19 0304 01/15/19 0421 01/16/19 0310  WBC 9.8  --  15.5* 11.6* 7.6 6.1  CREATININE 0.70  --  0.75 0.74 0.63 0.62  LATICACIDVEN  --  0.8  --   --   --   --     Estimated Creatinine Clearance: 63.1 mL/min (by C-G formula based on SCr of 0.62 mg/dL).    Allergies  Allergen Reactions  . Penicillins Hives    Has patient had a PCN reaction causing immediate rash, facial/tongue/throat swelling, SOB or lightheadedness with hypotension: Yes Has patient had a PCN reaction causing severe rash involving mucus membranes or skin necrosis: Yes Has patient had a PCN reaction that required hospitalization No Has patient had a PCN reaction occurring within the last 10 years: No-childhood allergy If all of the above answers are "NO", then may proceed with Cephalosporin  Tolerated cephalosporins in past  . Erythromycin Nausea And Vomiting  . Nitrofurantoin Monohyd Macro Nausea And Vomiting  . Oxybutynin Other (See Comments)    Dries mouth out     Antimicrobials this admission: 12/23 Cefepime >>  12/23 Vancomycin >>   Microbiology results: 12/23 BCx: Pending 12/23 UCx: Pending  . Plan: - Continue Vancomycin 1000 mg IV q 24 hours  - Est Calc AUC 530  - Monitor patients renal fxn and adjust as needed  - Hx of MDRO and MRSA follow cultures  - Contacted MD for De-escalation plan >> Waiting for ID/palliative recommendations for de-escalation plan   Thank you for allowing pharmacy to be a part of this patient's care.  Acey Lav, PharmD  PGY1 Acute Care Pharmacy Resident [phone] 01/16/2019 10:58 AM

## 2019-01-16 NOTE — Progress Notes (Signed)
Paged amion on-call to notify of pt continued anxiety, despite PO ativan given.

## 2019-01-16 NOTE — Progress Notes (Signed)
Rectal temp taken on pt at 94.36F. Pt. Had no c/o anything. MD notified. Heated blankets applied. Will continue to monitor.

## 2019-01-17 ENCOUNTER — Inpatient Hospital Stay (HOSPITAL_COMMUNITY): Payer: Medicare Other

## 2019-01-17 LAB — COMPREHENSIVE METABOLIC PANEL
ALT: 32 U/L (ref 0–44)
AST: 46 U/L — ABNORMAL HIGH (ref 15–41)
Albumin: 1.4 g/dL — ABNORMAL LOW (ref 3.5–5.0)
Alkaline Phosphatase: 957 U/L — ABNORMAL HIGH (ref 38–126)
Anion gap: 7 (ref 5–15)
BUN: 26 mg/dL — ABNORMAL HIGH (ref 6–20)
CO2: 18 mmol/L — ABNORMAL LOW (ref 22–32)
Calcium: 8.4 mg/dL — ABNORMAL LOW (ref 8.9–10.3)
Chloride: 109 mmol/L (ref 98–111)
Creatinine, Ser: 0.57 mg/dL (ref 0.44–1.00)
GFR calc Af Amer: >60 mL/min (ref >60)
GFR calc non Af Amer: >60 mL/min (ref >60)
Glucose, Bld: 129 mg/dL — ABNORMAL HIGH (ref 70–99)
Potassium: 3.6 mmol/L (ref 3.5–5.1)
Sodium: 134 mmol/L — ABNORMAL LOW (ref 135–145)
Total Bilirubin: 1.1 mg/dL (ref 0.3–1.2)
Total Protein: 6.2 g/dL — ABNORMAL LOW (ref 6.5–8.1)

## 2019-01-17 LAB — CULTURE, BLOOD (ROUTINE X 2)
Culture: NO GROWTH
Culture: NO GROWTH
Special Requests: ADEQUATE
Special Requests: ADEQUATE

## 2019-01-17 LAB — CBC
HCT: 28.5 % — ABNORMAL LOW (ref 36.0–46.0)
Hemoglobin: 8.3 g/dL — ABNORMAL LOW (ref 12.0–15.0)
MCH: 25.9 pg — ABNORMAL LOW (ref 26.0–34.0)
MCHC: 29.1 g/dL — ABNORMAL LOW (ref 30.0–36.0)
MCV: 88.8 fL (ref 80.0–100.0)
Platelets: 466 10*3/uL — ABNORMAL HIGH (ref 150–400)
RBC: 3.21 MIL/uL — ABNORMAL LOW (ref 3.87–5.11)
RDW: 23.3 % — ABNORMAL HIGH (ref 11.5–15.5)
WBC: 7.2 10*3/uL (ref 4.0–10.5)
nRBC: 0.3 % — ABNORMAL HIGH (ref 0.0–0.2)

## 2019-01-17 LAB — MRSA PCR SCREENING: MRSA by PCR: POSITIVE — AB

## 2019-01-17 LAB — D-DIMER, QUANTITATIVE: D-Dimer, Quant: 1.82 ug/mL-FEU — ABNORMAL HIGH (ref 0.00–0.50)

## 2019-01-17 LAB — C-REACTIVE PROTEIN: CRP: 11.9 mg/dL — ABNORMAL HIGH (ref <1.0)

## 2019-01-17 MED ORDER — IOHEXOL 350 MG/ML SOLN
100.0000 mL | Freq: Once | INTRAVENOUS | Status: AC | PRN
  Administered 2019-01-17: 70 mL via INTRAVENOUS

## 2019-01-17 MED ORDER — LORAZEPAM 2 MG/ML IJ SOLN
1.0000 mg | Freq: Two times a day (BID) | INTRAMUSCULAR | Status: DC | PRN
  Administered 2019-01-17 – 2019-01-18 (×3): 1 mg via INTRAVENOUS
  Filled 2019-01-17 (×3): qty 1

## 2019-01-17 MED ORDER — MUPIROCIN 2 % EX OINT
1.0000 "application " | TOPICAL_OINTMENT | Freq: Two times a day (BID) | CUTANEOUS | Status: AC
  Administered 2019-01-17 – 2019-01-22 (×10): 1 via NASAL
  Filled 2019-01-17 (×4): qty 22

## 2019-01-17 MED ORDER — RAMELTEON 8 MG PO TABS
8.0000 mg | ORAL_TABLET | Freq: Every day | ORAL | Status: DC
  Administered 2019-01-18 – 2019-02-04 (×15): 8 mg via ORAL
  Filled 2019-01-17 (×20): qty 1

## 2019-01-17 MED ORDER — LORAZEPAM 2 MG/ML IJ SOLN: 1.0000 mg | Freq: Once | INTRAMUSCULAR | Status: DC

## 2019-01-17 MED ORDER — CHLORHEXIDINE GLUCONATE CLOTH 2 % EX PADS
6.0000 | MEDICATED_PAD | Freq: Every day | CUTANEOUS | Status: DC
  Administered 2019-01-18: 06:00:00 6 via TOPICAL

## 2019-01-17 MED ORDER — ALBUMIN HUMAN 25 % IV SOLN
25.0000 g | Freq: Once | INTRAVENOUS | Status: AC
  Administered 2019-01-17: 22:00:00 25 g via INTRAVENOUS
  Filled 2019-01-17: qty 50

## 2019-01-17 NOTE — Progress Notes (Signed)
PROGRESS NOTE    Angela Page    Code Status: Full Code  HFS:[id number] DOB: 05/22/1970 DOA: 01/17/2019  PCP: Default, Provider, MD    Hospital Summary   Angela Page is a 48 y.o. female with medical history significant of Hx of quadriplegia following spinal cord injury with trach and PEG dependency chronically on 3L, chronic diastolic CHF, neurogenic orthostatic hypotension, COPD who presented from facility with concerns of lethargy. Patient is able to nod to yes or no to questions and attempts to answer questions although difficult to hear due to her tracheostomy.  Reportedly she was sent over by staff due to lethargy and concerns for sepsis.  She was found to be hypoxic down to 80% on 4 L and had to be placed on nonrebreather by EMS.    CT abdomen and pelvis showing suprapubic catheter appearing to protrude through posterior bladder wall with fluid in the pelvis.    Urology was consulted, SPT replaced by urology on 12/24, continue gravity drainage, return to normal interval exchanges at facility and CT cystogram to evaluate bladder.   Left tibia/fibula x-ray showing osseous demineralization concerning for osteomyelitis and orthopedic surgery was consulted who recommended ID consult, wound care and conservative management and did not recommend surgery at this time.  Palliative care consulted  A & P   Active Problems:   Quadriplegia ([place])   Decubitus ulcer of sacral region, stage 4 (HCC)   Anemia, iron deficiency   Tracheostomy status (HCC)   PEG (percutaneous endoscopic gastrostomy) status ([place])   Complicated UTI (urinary tract infection)   Sepsis (HCC)   Hyperkalemia   COVID-19   Osteomyelitis of left fibula (HCC)   Palliative care encounter  Acute on chronic hypoxia in the setting of COVID infection with tracheostomy and suspected pulmonary edema from mild volume overload O2 requirement increased, still 6 L CRP elevated likely acute phase reactant from Covid  and osteo CTA chest negative for PE today -Continue IV Decadron and remdesivir and added doxycycline for atypical coverage -will hold Actemra for now -Consider diuresis she received multiple boluses of IV fluids -Hold Lasix due to hypotension, give albumin  Elevated ammonia Restarted lactulose -Follow-up ammonia in a.m.  Anxiety Insomnia  -Ativan 1 mg IV twice daily as needed -Rozerem at bedtime  Cellulitis of the back  Continue vancomycin and cefepime per ID  Osteomyelitis of the left fibula, left greater trochanter, distal sacrum and left hip Orthopedic surgery consulted: Continue with medical management at this time with no surgical indication.  Recommended wound care and ID consult ID consulted recommended continuing Vanco/cefepime as well as palliative care consult due to multiple comorbidities, multiple hospitalizations and poor overall prognosis -Palliative care consulted, appreciate recommendations -We will continue IV antibiotics at this time -ID recommending long-term p.o. antibiotics with either doxy or Bactrim at discharge.  Would prefer Bactrim at discharge given Proteus UTI  Proteus UTI with suprapubic catheter  Urology consulted and recommended conservative management -Continue cefepime   Stage 4 decubitus ulcer Palliative care and wound care consult  Hypotension due to multiple infectious source Significantly improved with IV fluids  Hyperkalemia Resolved with Kayexalate x1  Acute on Chronic anemia status post 1 unit PRBCs Unclear etiology.  Stable  Quadriplegic due to spinal cord injury Poor prognosis with ongoing infections -Palliative care consulted  PEG tube dependent -Consult dietitian  Elevated LFTs ALT/ALT unremarkable Alk phos elevated 1000+ initially, now 957.  GGT elevated RUQ ultrasound unremarkable -Continue to monitor   DVT prophylaxis:  Lovenox SCDs Diet: Dysphagia 1 thin liquids Family Communication: Discussed with  patient at bedside Disposition Plan: Barriers to discharge include clinical stability  Consultants  Orthopedic surgery Urology Infectious disease Palliative care  Procedures  PRBC 12/25  Antibiotics   Anti-infectives (From admission, onward)   Start     Dose/Rate Route Frequency Ordered Stop   01/16/19 2200  doxycycline (VIBRA-TABS) tablet 100 mg     100 mg Oral Every 12 hours 01/16/19 1703 01/21/19 2159   01/13/19 1000  remdesivir 100 mg in sodium chloride 0.9 % 100 mL IVPB     100 mg 200 mL/hr over 30 Minutes Intravenous Daily 01/04/2019 2243 01/16/19 0902   01/13/19 0200  ceFEPIme (MAXIPIME) 2 g in sodium chloride 0.9 % 100 mL IVPB     2 g 200 mL/hr over 30 Minutes Intravenous Every 8 hours 01/14/2019 1831     01/15/2019 2245  remdesivir 200 mg in sodium chloride 0.9% 250 mL IVPB     200 mg 580 mL/hr over 30 Minutes Intravenous Once 01/01/2019 2243 01/13/19 0210   01/02/2019 1830  vancomycin (VANCOCIN) IVPB 1000 mg/200 mL premix     1,000 mg 200 mL/hr over 60 Minutes Intravenous Every 24 hours 01/05/2019 1831     01/02/2019 1730  vancomycin (VANCOCIN) IVPB 1000 mg/200 mL premix  Status:  Discontinued     1,000 mg 200 mL/hr over 60 Minutes Intravenous  Once 01/03/2019 1717 12/22/2018 1831   01/07/2019 1715  ceFEPIme (MAXIPIME) 2 g in sodium chloride 0.9 % 100 mL IVPB     2 g 200 mL/hr over 30 Minutes Intravenous  Once 01/07/2019 1713 01/09/2019 1837           Subjective    At bedside today.  She was calling out for help but she was feeling very anxious and short of breath.  She also complained that she was too low in her bed.  I will the patient sit further up in the bed with the bedside nurse assistance.  She was given Ativan.  She reported she did not sleep well last night and has had significant anxiety.  Denied chest pain, palpitations, nausea or vomiting.  Objective   Vitals:   01/17/19 0925 01/17/19 1200 01/17/19 1410 01/17/19 1600  BP:  99/71 105/79 (!) 88/67  Pulse: 85 91 84 91   Resp: (!) 21 17 (!) 21 17  Temp:      TempSrc:      SpO2: 96% 99% 97% 100%  Weight:        Intake/Output Summary (Last 24 hours) at 01/17/2019 1752 Last data filed at 01/17/2019 1400 Gross per 24 hour  Intake 1090 ml  Output 700 ml  Net 390 ml   Filed Weights   12/30/2018 1704  Weight: 46.5 kg    Examination:  Physical Exam Vitals and nursing note reviewed. Exam conducted with a chaperone present.  Constitutional:      General: She is in acute distress.     Appearance: She is not diaphoretic.  HENT:     Head: Normocephalic.  Eyes:     Conjunctiva/sclera: Conjunctivae normal.  Cardiovascular:     Rate and Rhythm: Normal rate and regular rhythm.  Pulmonary:     Effort: Pulmonary effort is normal. No respiratory distress.  Abdominal:     General: Abdomen is flat.  Musculoskeletal:        General: No swelling or tenderness.     Comments: Chronic contractures  Neurological:  Mental Status: Mental status is at baseline.  Psychiatric:     Comments: Anxious     Data Reviewed: I have personally reviewed following labs and imaging studies  CBC: Recent Labs  Lab 12/30/2018 1722 01/13/19 1900 01/14/19 0304 01/14/19 1931 01/15/19 0421 01/16/19 0310 01/17/19 0428  WBC 9.8 15.5* 11.6*  --  7.6 6.1 7.2  NEUTROABS 6.5  --   --   --   --   --   --   HGB 7.1* 6.1* 8.3* 7.6* 7.9* 8.2* 8.3*  HCT 25.5* 22.0* 28.2* 26.7* 27.0* 27.9* 28.5*  MCV 85.0 84.9 88.1  --  88.8 89.4 88.8  PLT 678* 516* 459*  --  468* 460* 147*   Basic Metabolic Panel: Recent Labs  Lab 01/13/19 1900 01/14/19 0304 01/15/19 0421 01/16/19 0310 01/17/19 0428  NA 134* 135 132* 135 134*  K 4.3 4.0 3.6 3.3* 3.6  CL 108 106 108 111 109  CO2 16* 17* 17* 18* 18*  GLUCOSE 58* 86 213* 153* 129*  BUN '19 19 18 20 '$ 26*  CREATININE 0.75 0.74 0.63 0.62 0.57  CALCIUM 8.3* 8.4* 8.0* 8.1* 8.4*  MG 1.7  --   --   --   --    GFR: Estimated Creatinine Clearance: 63.1 mL/min (by C-G formula based on SCr of  0.57 mg/dL). Liver Function Tests: Recent Labs  Lab 01/13/19 1900 01/14/19 0304 01/15/19 0421 01/16/19 0310 01/17/19 0428  AST 33 32 27 36 46*  ALT '28 27 23 24 '$ 32  ALKPHOS 1,155* 1,118* 1,002* 957* 957*  BILITOT 1.3* 1.5* 1.2 1.0 1.1  PROT 6.1* 6.0* 5.0* 5.9* 6.2*  ALBUMIN 1.5* 1.5* 1.3* 1.4* 1.4*   No results for input(s): LIPASE, AMYLASE in the last 168 hours. Recent Labs  Lab 01/16/19 1010  AMMONIA 59*   Coagulation Profile: No results for input(s): INR, PROTIME in the last 168 hours. Cardiac Enzymes: No results for input(s): CKTOTAL, CKMB, CKMBINDEX, TROPONINI in the last 168 hours. BNP (last 3 results) No results for input(s): PROBNP in the last 8760 hours. HbA1C: No results for input(s): HGBA1C in the last 72 hours. CBG: Recent Labs  Lab 01/13/19 2107 01/16/19 0629  GLUCAP 68* 162*   Lipid Profile: No results for input(s): CHOL, HDL, LDLCALC, TRIG, CHOLHDL, LDLDIRECT in the last 72 hours. Thyroid Function Tests: No results for input(s): TSH, T4TOTAL, FREET4, T3FREE, THYROIDAB in the last 72 hours. Anemia Panel: No results for input(s): VITAMINB12, FOLATE, FERRITIN, TIBC, IRON, RETICCTPCT in the last 72 hours. Sepsis Labs: Recent Labs  Lab 01/02/2019 1730  LATICACIDVEN 0.8    Recent Results (from the past 240 hour(s))  Culture, blood (routine x 2)     Status: None   Collection Time: 12/29/2018  5:35 PM   Specimen: BLOOD LEFT HAND  Result Value Ref Range Status   Specimen Description BLOOD LEFT HAND  Final   Special Requests   Final    BOTTLES DRAWN AEROBIC AND ANAEROBIC Blood Culture adequate volume   Culture   Final    NO GROWTH 5 DAYS Performed at [facility], [place] [address], [place], [place] [address]    Report Status 01/17/2019 FINAL  Final  Culture, blood (Routine X 2) w Reflex to ID Panel     Status: None   Collection Time: 12/29/2018  6:04 PM   Specimen: BLOOD RIGHT FOREARM  Result Value Ref Range Status   Specimen Description BLOOD  RIGHT FOREARM  Final   Special Requests  Final    BOTTLES DRAWN AEROBIC AND ANAEROBIC Blood Culture adequate volume   Culture   Final    NO GROWTH 5 DAYS Performed at [facility], [place] [address], [place], [place] [address]    Report Status 01/17/2019 FINAL  Final  Respiratory Panel by RT PCR (Flu A&B, Covid) - Nasopharyngeal Swab     Status: Abnormal   Collection Time: 12/27/2018  6:26 PM   Specimen: Nasopharyngeal Swab  Result Value Ref Range Status   SARS Coronavirus 2 by RT PCR POSITIVE (A) NEGATIVE Final    Comment: RESULT CALLED TO, READ BACK BY AND VERIFIED WITH: P PEICKERT RN 12/27/2018 1948 JDW (NOTE) SARS-CoV-2 target nucleic acids are DETECTED. SARS-CoV-2 RNA is generally detectable in upper respiratory specimens  during the acute phase of infection. Positive results are indicative of the presence of the identified virus, but do not rule out bacterial infection or co-infection with other pathogens not detected by the test. Clinical correlation with patient history and other diagnostic information is necessary to determine patient infection status. The expected result is Negative. Fact Sheet for Patients:  PinkCheek.be Fact Sheet for Healthcare Providers: GravelBags.it This test is not yet approved or cleared by the [place] FDA and  has been authorized for detection and/or diagnosis of SARS-CoV-2 by FDA under an Emergency Use Authorization (EUA).  This EUA will remain in effect (meaning this test can be used) for t he duration of  the COVID-19 declaration under Section 564(b)(1) of the Act, 21 U.S.C. section 360bbb-3(b)(1), unless the authorization is terminated or revoked sooner.    Influenza A by PCR NEGATIVE NEGATIVE Final   Influenza B by PCR NEGATIVE NEGATIVE Final    Comment: (NOTE) The Xpert Xpress SARS-CoV-2/FLU/RSV assay is intended as an aid in  the diagnosis of influenza from Nasopharyngeal  swab specimens and  should not be used as a sole basis for treatment. Nasal washings and  aspirates are unacceptable for Xpert Xpress SARS-CoV-2/FLU/RSV  testing. Fact Sheet for Patients: PinkCheek.be Fact Sheet for Healthcare Providers: GravelBags.it This test is not yet approved or cleared by the [place] FDA and  has been authorized for detection and/or diagnosis of SARS-CoV-2 by  FDA under an Emergency Use Authorization (EUA). This EUA will remain  in effect (meaning this test can be used) for the duration of the  Covid-19 declaration under Section 564(b)(1) of the Act, 21  U.S.C. section 360bbb-3(b)(1), unless the authorization is  terminated or revoked. Performed at [facility], [place] [address], [place], [place] [address]   Urine culture     Status: Abnormal   Collection Time: 01/09/2019  8:22 PM   Specimen: Urine, Catheterized  Result Value Ref Range Status   Specimen Description URINE, CATHETERIZED  Final   Special Requests   Final    NONE Performed at [facility], [address] [place], [place] [address]    Culture >=100,000 COLONIES/mL PROTEUS MIRABILIS (A)  Final   Report Status 01/15/2019 FINAL  Final   Organism ID, Bacteria PROTEUS MIRABILIS (A)  Final      Susceptibility   Proteus mirabilis - MIC*    AMPICILLIN <=2 SENSITIVE Sensitive     CEFAZOLIN 8 SENSITIVE Sensitive     CEFTRIAXONE <=1 SENSITIVE Sensitive     CIPROFLOXACIN >=4 RESISTANT Resistant     GENTAMICIN <=1 SENSITIVE Sensitive     IMIPENEM 4 SENSITIVE Sensitive     NITROFURANTOIN 128 RESISTANT Resistant  TRIMETH/SULFA <=20 SENSITIVE Sensitive     AMPICILLIN/SULBACTAM <=2 SENSITIVE Sensitive     PIP/TAZO <=4 SENSITIVE Sensitive     * >=100,000 COLONIES/mL PROTEUS MIRABILIS  MRSA PCR Screening     Status: Abnormal   Collection Time: 01/17/19 11:36 AM   Specimen: Nasal Mucosa; Nasopharyngeal  Result Value Ref Range Status    MRSA by PCR POSITIVE (A) NEGATIVE Final    Comment:        The GeneXpert MRSA Assay (FDA approved for NASAL specimens only), is one component of a comprehensive MRSA colonization surveillance program. It is not intended to diagnose MRSA infection nor to guide or monitor treatment for MRSA infections. RESULT CALLED TO, READ BACK BY AND VERIFIED WITH: Elon Jester RN 13:40 01/17/19 ([email or web address]) Performed at [facility], [place] [address] [place], [place] [address]          Radiology Studies: CT ANGIO CHEST PE W OR WO CONTRAST  Result Date: 01/17/2019 CLINICAL DATA:  Shortness of breath. Altered mental status. Difficulty with following breathing instructions, which somewhat limits this study. EXAM: CT ANGIOGRAPHY CHEST WITH CONTRAST TECHNIQUE: Multidetector CT imaging of the chest was performed using the standard protocol during bolus administration of intravenous contrast. Multiplanar CT image reconstructions and MIPs were obtained to evaluate the vascular anatomy. CONTRAST:  10m OMNIPAQUE IOHEXOL 350 MG/ML SOLN COMPARISON:  Chest radiograph, 01/16/2019.  CTA chest, 06/09/2016. FINDINGS: Cardiovascular: There is satisfactory opacification of the pulmonary arteries to the segmental level. There is no evidence of a pulmonary embolism. Heart is top-normal in size. No pericardial effusion. Two vessel coronary artery calcifications. Aorta is normal in caliber. No dissection. Minor aortic atherosclerosis. Mediastinum/Nodes: Well-positioned tracheostomy tube. No neck base or axillary masses or enlarged lymph nodes. 1.2 cm short axis right paratracheal lymph node, image 35, series 5. Poorly defined additional prominent lymph nodes noted along the paratracheal region and in the pre-vascular space. No mediastinal masses. No hilar masses or enlarged lymph nodes. Trachea is patent. Esophagus is unremarkable. Right PICC tip in the right atrium.  AICD leads well positioned. Lungs/Pleura: Small  bilateral pleural effusions. Bilateral interstitial thickening. Patchy areas of ground-glass opacity are noted bilaterally. There is dependent opacity in the lower lobes that is consistent with atelectasis. No pneumothorax. Upper Abdomen: No acute abnormality. Musculoskeletal: Diffuse subcutaneous soft tissue edema. No fracture or bone lesion.  No chest wall masses. Review of the MIP images confirms the above findings. IMPRESSION: 1. No evidence of a pulmonary embolism. 2. Suspect pulmonary edema, likely on the basis of congestive heart failure, reflected by interstitial thickening, small bilateral effusions and patchy areas of ground-glass opacity. Infection should be considered if there are consistent clinical findings. 3. There is additional dependent opacity in the lower lobes consistent with atelectasis. 4. Two vessel coronary artery calcifications. Minor aortic atherosclerosis. Aortic Atherosclerosis (ICD10-I70.0). Electronically Signed   By: DLajean ManesM.D.   On: 01/17/2019 17:10   DG CHEST PORT 1 VIEW  Result Date: 01/16/2019 CLINICAL DATA:  48year old female with dyspnea, COVID-19 and sepsis EXAM: PORTABLE CHEST 1 VIEW COMPARISON:  Prior chest x-ray 01/09/2019 FINDINGS: Tracheostomy tube remains in good position. The tip of the tube is midline and at the level of the clavicles. Left subclavian approach cardiac rhythm maintenance device remains in unchanged position. Right upper extremity PICC has been placed. The tip overlies the cavoatrial junction. Cardiac and mediastinal contours are within normal limits. Inspiratory volumes are low. Small right pleural effusion. Patchy interstitial and airspace opacities present in  both lower lungs and in the left mid lung. IMPRESSION: 1. Lower inspiratory volumes. 2. Developing patchy airspace opacities in the left mid lung and bilateral bases likely representing multifocal viral pneumonia. 3. Small right pleural effusion. 4. New right upper extremity PICC.  Catheter tip overlies the superior cavoatrial junction. 5. Other support apparatus in stable and satisfactory position. Electronically Signed   By: Jacqulynn Cadet M.D.   On: 01/16/2019 13:49   US Abdomen Limited RUQ  Result Date: 01/15/2019 CLINICAL DATA:  Elevated alkaline phosphatase, history quadriplegia, substance abuse, CHF, COPD EXAM: ULTRASOUND ABDOMEN LIMITED RIGHT UPPER QUADRANT COMPARISON:  CT abdomen and pelvis 01/09/2019 FINDINGS: Gallbladder: Well distended with mildly thickened gallbladder wall. Small amount of dependent sludge. No shadowing calculi or sonographic Murphy sign. Common bile duct: Diameter: 3 mm, normal Liver: Normal echogenicity without mass. Portal vein is patent on color Doppler imaging with normal direction of blood flow towards the liver. Other: Small amount of perihepatic free fluid. IMPRESSION: Small amount of perihepatic free fluid. Mild gallbladder wall thickening, nonspecific in the setting of ascites. No others RIGHT upper quadrant sonographic abnormalities identified. Electronically Signed   By: Lavonia Dana M.D.   On: 01/15/2019 22:04        Scheduled Meds: . Chlorhexidine Gluconate Cloth  6 each Topical Daily  . Chlorhexidine Gluconate Cloth  6 each Topical Daily  . [START ON 01/18/2019] Chlorhexidine Gluconate Cloth  6 each Topical Q0600  . dexamethasone (DECADRON) injection  6 mg Intravenous Q24H  . doxycycline  100 mg Oral Q12H  . enoxaparin (LOVENOX) injection  40 mg Subcutaneous Q24H  . feeding supplement (PRO-STAT SUGAR FREE 64)  30 mL Per Tube TID  . fentaNYL (SUBLIMAZE) injection  25 mcg Intravenous Once  . gabapentin  300 mg Oral TID  . lactulose  20 g Oral BID  . multivitamin  1 tablet Oral Daily  . mupirocin ointment  1 application Nasal BID  . nutrition supplement (JUVEN)  1 packet Oral BID BM  . sodium chloride flush  10-40 mL Intracatheter Q12H   Continuous Infusions: . albumin human    . ceFEPime (MAXIPIME) IV 2 g (01/17/19  1044)  . vancomycin 1,000 mg (01/16/19 1850)     LOS: 5 days    Time spent: 30 minutes with over 50% of the time coordinating the patient's care    Harold Hedge, DO [facility] Pager [phone]  If 7PM-7AM, please contact night-coverage [email or web address] Password [facility] 01/17/2019, 5:52 PM

## 2019-01-17 NOTE — Progress Notes (Signed)
Patient resides at [facility]. CSW confirmed with facility liaison that patient will be able to return to [place] once medically stable. CSW continuing to follow.   Cedric Fishman LCSW [phone]

## 2019-01-18 ENCOUNTER — Inpatient Hospital Stay (HOSPITAL_COMMUNITY): Payer: Medicare Other

## 2019-01-18 DIAGNOSIS — R0600 Dyspnea, unspecified: Secondary | ICD-10-CM

## 2019-01-18 DIAGNOSIS — U071 COVID-19: Secondary | ICD-10-CM

## 2019-01-18 DIAGNOSIS — D509 Iron deficiency anemia, unspecified: Secondary | ICD-10-CM

## 2019-01-18 DIAGNOSIS — I509 Heart failure, unspecified: Secondary | ICD-10-CM

## 2019-01-18 LAB — HEMOGLOBIN AND HEMATOCRIT, BLOOD
HCT: 28.9 % — ABNORMAL LOW (ref 36.0–46.0)
Hemoglobin: 8.5 g/dL — ABNORMAL LOW (ref 12.0–15.0)

## 2019-01-18 LAB — POCT I-STAT 7, (LYTES, BLD GAS, ICA,H+H)
Acid-base deficit: 1 mmol/L (ref 0.0–2.0)
Bicarbonate: 27.6 mmol/L (ref 20.0–28.0)
Calcium, Ion: 1.08 mmol/L — ABNORMAL LOW (ref 1.15–1.40)
HCT: 27 % — ABNORMAL LOW (ref 36.0–46.0)
Hemoglobin: 9.2 g/dL — ABNORMAL LOW (ref 12.0–15.0)
O2 Saturation: 91 %
Patient temperature: 98.6
Potassium: 4.6 mmol/L (ref 3.5–5.1)
Sodium: 148 mmol/L — ABNORMAL HIGH (ref 135–145)
TCO2: 30 mmol/L (ref 22–32)
pCO2 arterial: 65 mmHg — ABNORMAL HIGH (ref 32.0–48.0)
pH, Arterial: 7.236 — ABNORMAL LOW (ref 7.350–7.450)
pO2, Arterial: 73 mmHg — ABNORMAL LOW (ref 83.0–108.0)

## 2019-01-18 LAB — AMMONIA: Ammonia: 55 umol/L — ABNORMAL HIGH (ref 9–35)

## 2019-01-18 LAB — GLUCOSE, CAPILLARY
Glucose-Capillary: 114 mg/dL — ABNORMAL HIGH (ref 70–99)
Glucose-Capillary: 125 mg/dL — ABNORMAL HIGH (ref 70–99)
Glucose-Capillary: 159 mg/dL — ABNORMAL HIGH (ref 70–99)
Glucose-Capillary: 209 mg/dL — ABNORMAL HIGH (ref 70–99)

## 2019-01-18 LAB — PHOSPHORUS
Phosphorus: 3 mg/dL (ref 2.5–4.6)
Phosphorus: 2.5 mg/dL (ref 2.5–4.6)

## 2019-01-18 LAB — COMPREHENSIVE METABOLIC PANEL
ALT: 28 U/L (ref 0–44)
AST: 39 U/L (ref 15–41)
Albumin: 1.8 g/dL — ABNORMAL LOW (ref 3.5–5.0)
Alkaline Phosphatase: 825 U/L — ABNORMAL HIGH (ref 38–126)
Anion gap: 7 (ref 5–15)
BUN: 51 mg/dL — ABNORMAL HIGH (ref 6–20)
CO2: 17 mmol/L — ABNORMAL LOW (ref 22–32)
Calcium: 8.7 mg/dL — ABNORMAL LOW (ref 8.9–10.3)
Chloride: 111 mmol/L (ref 98–111)
Creatinine, Ser: 0.53 mg/dL (ref 0.44–1.00)
GFR calc Af Amer: >60 mL/min (ref >60)
GFR calc non Af Amer: >60 mL/min (ref >60)
Glucose, Bld: 142 mg/dL — ABNORMAL HIGH (ref 70–99)
Potassium: 3.2 mmol/L — ABNORMAL LOW (ref 3.5–5.1)
Sodium: 135 mmol/L (ref 135–145)
Total Bilirubin: 1.3 mg/dL — ABNORMAL HIGH (ref 0.3–1.2)
Total Protein: 6.2 g/dL — ABNORMAL LOW (ref 6.5–8.1)

## 2019-01-18 LAB — CBC
HCT: 29 % — ABNORMAL LOW (ref 36.0–46.0)
Hemoglobin: 8.6 g/dL — ABNORMAL LOW (ref 12.0–15.0)
MCH: 25.9 pg — ABNORMAL LOW (ref 26.0–34.0)
MCHC: 29.7 g/dL — ABNORMAL LOW (ref 30.0–36.0)
MCV: 87.3 fL (ref 80.0–100.0)
Platelets: 448 10*3/uL — ABNORMAL HIGH (ref 150–400)
RBC: 3.32 MIL/uL — ABNORMAL LOW (ref 3.87–5.11)
RDW: 23.8 % — ABNORMAL HIGH (ref 11.5–15.5)
WBC: 9.3 10*3/uL (ref 4.0–10.5)
nRBC: 0 % (ref 0.0–0.2)

## 2019-01-18 LAB — MAGNESIUM
Magnesium: 1.5 mg/dL — ABNORMAL LOW (ref 1.7–2.4)
Magnesium: 1.4 mg/dL — ABNORMAL LOW (ref 1.7–2.4)

## 2019-01-18 LAB — HEPARIN LEVEL (UNFRACTIONATED): Heparin Unfractionated: 0.36 IU/mL (ref 0.30–0.70)

## 2019-01-18 LAB — VANCOMYCIN, PEAK: Vancomycin Pk: 57 ug/mL (ref 30–40)

## 2019-01-18 MED ORDER — HYDROCORTISONE NA SUCCINATE PF 100 MG IJ SOLR
50.0000 mg | Freq: Four times a day (QID) | INTRAMUSCULAR | Status: DC
  Administered 2019-01-18 – 2019-01-20 (×8): 50 mg via INTRAVENOUS
  Filled 2019-01-18 (×8): qty 2

## 2019-01-18 MED ORDER — MIDODRINE HCL 5 MG PO TABS
10.0000 mg | ORAL_TABLET | Freq: Three times a day (TID) | ORAL | Status: DC
  Administered 2019-01-18: 18:00:00 10 mg via ORAL
  Filled 2019-01-18 (×2): qty 2

## 2019-01-18 MED ORDER — POTASSIUM CHLORIDE CRYS ER 20 MEQ PO TBCR
40.0000 meq | EXTENDED_RELEASE_TABLET | Freq: Once | ORAL | Status: AC
  Administered 2019-01-18: 40 meq via ORAL
  Filled 2019-01-18: qty 2

## 2019-01-18 MED ORDER — NOREPINEPHRINE 4 MG/250ML-% IV SOLN
INTRAVENOUS | Status: AC
  Administered 2019-01-18: 2 ug/min via INTRAVENOUS
  Filled 2019-01-18: qty 250

## 2019-01-18 MED ORDER — NOREPINEPHRINE 4 MG/250ML-% IV SOLN: 0.0000 ug/min | INTRAVENOUS | Status: DC

## 2019-01-18 MED ORDER — VASOPRESSIN 20 UNIT/ML IV SOLN
0.0400 [IU]/min | INTRAVENOUS | Status: DC
  Filled 2019-01-18: qty 2

## 2019-01-18 MED ORDER — ALBUMIN HUMAN 25 % IV SOLN
25.0000 g | Freq: Four times a day (QID) | INTRAVENOUS | Status: AC
  Administered 2019-01-18 – 2019-01-19 (×4): 25 g via INTRAVENOUS
  Filled 2019-01-18 (×4): qty 100

## 2019-01-18 MED ORDER — GABAPENTIN 100 MG PO CAPS
200.0000 mg | ORAL_CAPSULE | Freq: Three times a day (TID) | ORAL | Status: DC
  Administered 2019-01-18 (×2): 200 mg via ORAL
  Filled 2019-01-18 (×3): qty 2

## 2019-01-18 MED ORDER — GUAIFENESIN 100 MG/5ML PO SOLN
15.0000 mL | Freq: Four times a day (QID) | ORAL | Status: DC
  Administered 2019-01-18: 18:00:00 300 mg via ORAL
  Filled 2019-01-18: qty 15

## 2019-01-18 MED ORDER — CHLORHEXIDINE GLUCONATE 0.12 % MT SOLN
15.0000 mL | Freq: Two times a day (BID) | OROMUCOSAL | Status: DC
  Administered 2019-01-18 – 2019-02-05 (×32): 15 mL via OROMUCOSAL
  Filled 2019-01-18 (×17): qty 15

## 2019-01-18 MED ORDER — HEPARIN BOLUS VIA INFUSION
2500.0000 [IU] | Freq: Once | INTRAVENOUS | Status: AC
  Administered 2019-01-18: 10:00:00 2500 [IU] via INTRAVENOUS
  Filled 2019-01-18: qty 2500

## 2019-01-18 MED ORDER — GUAIFENESIN 100 MG/5ML PO SOLN
15.0000 mL | Freq: Four times a day (QID) | ORAL | Status: DC
  Administered 2019-01-18 – 2019-01-21 (×9): 300 mg
  Filled 2019-01-18: qty 30
  Filled 2019-01-18: qty 15
  Filled 2019-01-18: qty 30
  Filled 2019-01-18 (×5): qty 15

## 2019-01-18 MED ORDER — LACTULOSE 10 GM/15ML PO SOLN
30.0000 g | Freq: Two times a day (BID) | ORAL | Status: DC
  Administered 2019-01-18 (×2): 30 g via ORAL
  Filled 2019-01-18 (×3): qty 45

## 2019-01-18 MED ORDER — INSULIN ASPART 100 UNIT/ML ~~LOC~~ SOLN
0.0000 [IU] | SUBCUTANEOUS | Status: DC
  Administered 2019-01-18 (×2): 2 [IU] via SUBCUTANEOUS
  Administered 2019-01-18: 21:00:00 8 [IU] via SUBCUTANEOUS
  Administered 2019-01-19: 16:00:00 4 [IU] via SUBCUTANEOUS
  Administered 2019-01-19: 20:00:00 8 [IU] via SUBCUTANEOUS
  Administered 2019-01-20 (×4): 2 [IU] via SUBCUTANEOUS
  Administered 2019-01-20 (×2): 8 [IU] via SUBCUTANEOUS
  Administered 2019-01-21 (×3): 16 [IU] via SUBCUTANEOUS
  Administered 2019-01-21: 20 [IU] via SUBCUTANEOUS
  Administered 2019-01-21: 08:00:00 4 [IU] via SUBCUTANEOUS
  Administered 2019-01-21: 05:00:00 8 [IU] via SUBCUTANEOUS
  Administered 2019-01-22: 4 [IU] via SUBCUTANEOUS
  Administered 2019-01-22: 17:00:00 12 [IU] via SUBCUTANEOUS
  Administered 2019-01-22: 04:00:00 20 [IU] via SUBCUTANEOUS
  Administered 2019-01-22: 12 [IU] via SUBCUTANEOUS
  Administered 2019-01-22: 12:00:00 20 [IU] via SUBCUTANEOUS
  Administered 2019-01-22 – 2019-01-23 (×2): 2 [IU] via SUBCUTANEOUS
  Administered 2019-01-24: 8 [IU] via SUBCUTANEOUS
  Administered 2019-01-24: 4 [IU] via SUBCUTANEOUS
  Administered 2019-01-25 – 2019-01-27 (×3): 2 [IU] via SUBCUTANEOUS
  Administered 2019-01-27: 13:00:00 4 [IU] via SUBCUTANEOUS
  Administered 2019-01-28 – 2019-01-29 (×3): 2 [IU] via SUBCUTANEOUS
  Administered 2019-01-29 (×2): 4 [IU] via SUBCUTANEOUS
  Administered 2019-01-29 – 2019-01-30 (×2): 2 [IU] via SUBCUTANEOUS
  Administered 2019-01-30: 21:00:00 4 [IU] via SUBCUTANEOUS
  Administered 2019-01-30: 13:00:00 2 [IU] via SUBCUTANEOUS
  Administered 2019-01-30: 10:00:00 4 [IU] via SUBCUTANEOUS
  Administered 2019-01-31 (×2): 2 [IU] via SUBCUTANEOUS
  Administered 2019-01-31 (×3): 4 [IU] via SUBCUTANEOUS
  Administered 2019-01-31: 8 [IU] via SUBCUTANEOUS
  Administered 2019-01-31: 21:00:00 2 [IU] via SUBCUTANEOUS
  Administered 2019-02-01: 4 [IU] via SUBCUTANEOUS
  Administered 2019-02-01 (×2): 2 [IU] via SUBCUTANEOUS
  Administered 2019-02-01 (×3): 8 [IU] via SUBCUTANEOUS
  Administered 2019-02-02 (×2): 2 [IU] via SUBCUTANEOUS
  Administered 2019-02-02: 13:00:00 4 [IU] via SUBCUTANEOUS

## 2019-01-18 MED ORDER — ORAL CARE MOUTH RINSE
15.0000 mL | Freq: Two times a day (BID) | OROMUCOSAL | Status: DC
  Administered 2019-01-18 – 2019-01-20 (×5): 15 mL via OROMUCOSAL

## 2019-01-18 MED ORDER — VITAL HIGH PROTEIN PO LIQD
1000.0000 mL | ORAL | Status: DC
  Administered 2019-01-18 – 2019-01-19 (×2): 1000 mL

## 2019-01-18 MED ORDER — CHLORHEXIDINE GLUCONATE CLOTH 2 % EX PADS
6.0000 | MEDICATED_PAD | Freq: Every day | CUTANEOUS | Status: DC
  Administered 2019-01-19 – 2019-02-05 (×18): 6 via TOPICAL

## 2019-01-18 MED ORDER — HEPARIN (PORCINE) 25000 UT/250ML-% IV SOLN
700.0000 [IU]/h | INTRAVENOUS | Status: DC
  Administered 2019-01-18: 11:00:00 700 [IU]/h via INTRAVENOUS

## 2019-01-18 MED ORDER — PANTOPRAZOLE SODIUM 40 MG IV SOLR
40.0000 mg | Freq: Every day | INTRAVENOUS | Status: DC
  Administered 2019-01-18: 22:00:00 40 mg via INTRAVENOUS
  Filled 2019-01-18 (×2): qty 40

## 2019-01-18 MED ORDER — SODIUM CHLORIDE 0.9 % IV SOLN: INTRAVENOUS | Status: DC | PRN

## 2019-01-18 NOTE — Progress Notes (Signed)
RT attempted ABG x2 without success. MD and RN notified.

## 2019-01-18 NOTE — Progress Notes (Signed)
Spoke with mother via phone. She has elected for DNR at this time for her duaghter. She understands that we will be aggressive with her care as her wishes have been previously expressed but if her heart stops we would let her pass peacefully at that time without chest compressions, shock, acls.   She is ok with vent support and pressors if needed otherwise.   Code status updated.

## 2019-01-18 NOTE — Progress Notes (Signed)
ANTICOAGULATION CONSULT NOTE - Initial Consult  Pharmacy Consult for Heparin Indication: pulmonary embolus  Allergies  Allergen Reactions  . Penicillins Hives    Has patient had a PCN reaction causing immediate rash, facial/tongue/throat swelling, SOB or lightheadedness with hypotension: Yes Has patient had a PCN reaction causing severe rash involving mucus membranes or skin necrosis: Yes Has patient had a PCN reaction that required hospitalization No Has patient had a PCN reaction occurring within the last 10 years: No-childhood allergy If all of the above answers are "NO", then may proceed with Cephalosporin  Tolerated cephalosporins in past  . Erythromycin Nausea And Vomiting  . Nitrofurantoin Monohyd Macro Nausea And Vomiting  . Oxybutynin Other (See Comments)    Dries mouth out     Patient Measurements: Weight: 102 lb 8 oz (46.5 kg) Heparin Dosing Weight: TBW  Vital Signs: Temp: 97.8 F (36.6 C) (12/29 0939) Temp Source: Oral (12/29 0400) BP: 112/76 (12/29 0916) Pulse Rate: 92 (12/29 0916)  Labs: Recent Labs    01/16/19 0310 01/17/19 0428 01/18/19 0323  HGB 8.2* 8.3* 8.6*  HCT 27.9* 28.5* 29.0*  PLT 460* 466* 448*  CREATININE 0.62 0.57 0.53    Estimated Creatinine Clearance: 63.1 mL/min (by C-G formula based on SCr of 0.53 mg/dL).   Medical History: Past Medical History:  Diagnosis Date  . Acute on chronic respiratory failure with hypoxia ([place])   . Anasarca 06/10/2016  . Asthma   . At high risk for severe sepsis   . Cocaine use 10/08/2013  . Decubitus ulcer of buttock, unstageable ([place])   . Depression   . Diabetes mellitus without complication ([place])   . GERD (gastroesophageal reflux disease)   . HCAP (healthcare-associated pneumonia) 06/09/2016  . Hepatitis    hx of hepatits frm mono   . Kidney stone   . Lobar pneumonia ([place])   . Overdose of opiate or related narcotic ([place]) 09/02/2014  . Pacemaker   . Pleurisy   . Polysubstance dependence including  opioid type drug, episodic abuse ([place]) 10/27/2013  . Protein calorie malnutrition ([place]) 10/31/2015  . Quadriparesis ([place])   . Quadriplegia and quadriparesis ([place])   . Quadriplegia, C5-C7 incomplete ([place]) 10/08/2013  . Stage IV pressure ulcer of sacral region ([place]) 10/31/2015  . Tracheostomy status ([place])    Assessment: 7 YOF presenting with lethargy, COVID +, now with concern for PE.  Has been on lovenox 40mg  SQ daily while admitted, last dose >12h ago.    Goal of Therapy:  Heparin level 0.3-0.7 units/ml Monitor platelets by anticoagulation protocol: Yes   Plan:  Heparin 2500 units IV x1, and gtt at 700 units/hr F/u 6 hour heparin level F/u PE workup  Bertis Ruddy, PharmD Clinical Pharmacist Please check AMION for all Lucky numbers 01/18/2019 10:22 AM

## 2019-01-18 NOTE — Progress Notes (Addendum)
Patient was transferred to 70M due to acute respiratory distress, oxygen sat 94% on 15L, on  Tracheostomy collar, pt alert and oriented,  started on a heparin drip at 7 ml/hr  as order by MD Neysa Bonito,  report given to RN.      Vital Signs MEWS/VS Documentation      01/18/2019 0916 01/18/2019 0939 01/18/2019 1034 01/18/2019 1100   MEWS Score:  1  1  4  4    MEWS Score Color:  Green  Green  Red  Red   Resp:  (!) 21  --  (!) 26  (!) 22   Pulse:  92  --  (!) 101  87   BP:  112/76  --  96/75  (!) 86/58   Temp:  --  97.8 F (36.6 C)  --  (!) 94.3 F (34.6 C)   O2 Device:  Tracheostomy Collar  --  --  Tracheostomy Collar   O2 Flow Rate (L/min):  9 L/min  --  --  15 L/min   FiO2 (%):  --  --  --  55 %   Level of Consciousness:  Alert  --  --  Alert           Ellee Wawrzyniak A Rivera Montejano 01/18/2019,12:12 PM

## 2019-01-18 NOTE — Progress Notes (Signed)
PROGRESS NOTE    Angela Page    Code Status: Full Code  ASN:[id number] DOB: March 03, 1970 DOA: 12/21/2018  PCP: Default, Provider, MD    Hospital Summary   Angela Page is a 48 y.o. female with medical history significant of Hx of quadriplegia following spinal cord injury with trach and PEG dependency chronically on 3L, chronic diastolic CHF, neurogenic orthostatic hypotension, COPD who presented from facility with concerns of lethargy. Patient is able to nod to yes or no to questions and attempts to answer questions although difficult to hear due to her tracheostomy.  Reportedly she was sent over by staff due to lethargy and concerns for sepsis.  She was found to be hypoxic down to 80% on 4 L and had to be placed on nonrebreather by EMS.    CT abdomen and pelvis showing suprapubic catheter appearing to protrude through posterior bladder wall with fluid in the pelvis.    Urology was consulted, SPT replaced by urology on 12/24, continue gravity drainage, return to normal interval exchanges at facility and CT cystogram to evaluate bladder.   Left tibia/fibula x-ray showing osseous demineralization concerning for osteomyelitis and orthopedic surgery was consulted who recommended ID consult, wound care and conservative management and did not recommend surgery at this time.  Palliative care consulted  12/29: Significant event, patient acutely decompensated with increased respiratory distress and O2 requirements and started on heparin drip prophylactically.  Transferred to ICU.  A & P   Active Problems:   Quadriplegia (HCC)   Decubitus ulcer of sacral region, stage 4 (HCC)   Anemia, iron deficiency   Tracheostomy status (HCC)   PEG (percutaneous endoscopic gastrostomy) status ([place])   Complicated UTI (urinary tract infection)   Sepsis (HCC)   Hyperkalemia   COVID-19   Osteomyelitis of left fibula (HCC)   Palliative care encounter  Acute on chronic hypoxia in the setting of  COVID infection with tracheostomy, acutely decompensated this a.m. O2 requirement increased to 15 L and 55% FiO2.  Concern for mucous plug versus atelectasis versus pulmonary embolus versus worsened Covid (see significant event note from 12/29) CRP elevated likely acute phase reactant from Covid and osteo CTA chest negative for PE 12/28 -Continue IV Decadron and remdesivir  -will hold Actemra for now, especially with UTI and Osteo -Consider diuresis, received several boluses of NS and 1u PRBC, lasix held yesterday due to hypotension and given albumin with improvement -Check weights daily  -Transfer to ICU -Consider reconsulting palliative care  Elevated ammonia ammonia still elevated despite home lactulose -Increase lactulose -Follow-up ammonia in a.m.  Anxiety Insomnia  -Discontinue Ativan as this may be contributing to her lethargy -Rozerem at bedtime  Cellulitis of the back  Continue vancomycin and cefepime per ID  Osteomyelitis of the left fibula, left greater trochanter, distal sacrum and left hip Orthopedic surgery consulted: Continue with medical management at this time with no surgical indication.  Recommended wound care and ID consult ID consulted recommended continuing Vanco/cefepime as well as palliative care consult due to multiple comorbidities, multiple hospitalizations and poor overall prognosis -Palliative care consulted, appreciate recommendations -continue IV antibiotics at this time -ID recommending long-term p.o. antibiotics. Bactrim at discharge given Proteus UTI  Proteus UTI with suprapubic catheter  CT abdomen and pelvis showing suprapubic catheter appearing to protrude through posterior bladder wall with fluid in the pelvis.   Urology was consulted, SPT replaced by urology on 12/24, continue gravity drainage, return to normal interval exchanges at facility and CT cystogram to  evaluate bladder.  -conservative management -Continue cefepime for now  Stage  4 decubitus ulcer Palliative care and wound care consulted  Sick sinus syndrome status post pacemaker  Hypotension due to multiple infectious source Significantly improved with IV fluids  Hyperkalemia Resolved with Kayexalate x1  Acute on Chronic anemia status post 1 unit PRBCs Unclear etiology.  Stable  Quadriplegic due to spinal cord injury Poor prognosis with ongoing infections -Palliative care consulted  PEG tube dependent -Consult dietitian  Hypokalemia replete  Elevated LFTs ALT/ALT unremarkable Alk phos elevated 1000+ initially, now 825.  GGT elevated RUQ ultrasound unremarkable -Continue to monitor   DVT prophylaxis: Heparin drip Diet: N.p.o. Family Communication: Discussed with mother over the phone Disposition Plan: Barriers to discharge include clinical stability  Consultants  Orthopedic surgery Urology Infectious disease Palliative care  Procedures  1 unit PRBC 12/25  Antibiotics   Anti-infectives (From admission, onward)   Start     Dose/Rate Route Frequency Ordered Stop   01/16/19 2200  doxycycline (VIBRA-TABS) tablet 100 mg     100 mg Oral Every 12 hours 01/16/19 1703 01/21/19 2159   01/13/19 1000  remdesivir 100 mg in sodium chloride 0.9 % 100 mL IVPB     100 mg 200 mL/hr over 30 Minutes Intravenous Daily 01/05/2019 2243 01/16/19 0902   01/13/19 0200  ceFEPIme (MAXIPIME) 2 g in sodium chloride 0.9 % 100 mL IVPB     2 g 200 mL/hr over 30 Minutes Intravenous Every 8 hours 01/14/2019 1831     01/19/2019 2245  remdesivir 200 mg in sodium chloride 0.9% 250 mL IVPB     200 mg 580 mL/hr over 30 Minutes Intravenous Once 01/08/2019 2243 01/13/19 0210   01/07/2019 1830  vancomycin (VANCOCIN) IVPB 1000 mg/200 mL premix     1,000 mg 200 mL/hr over 60 Minutes Intravenous Every 24 hours 12/23/2018 1831     12/23/2018 1730  vancomycin (VANCOCIN) IVPB 1000 mg/200 mL premix  Status:  Discontinued     1,000 mg 200 mL/hr over 60 Minutes Intravenous  Once 01/01/2019  1717 12/23/2018 1831   12/24/2018 1715  ceFEPIme (MAXIPIME) 2 g in sodium chloride 0.9 % 100 mL IVPB     2 g 200 mL/hr over 30 Minutes Intravenous  Once 12/30/2018 1713 12/24/2018 1837           Subjective   Called to bedside today for increased work of breathing and SPO2 mid to low 80s and respiratory distress.  Patient had received Ativan roughly 1-1/2 hours prior and roughly 10 minutes prior to being notified she had decompensated.  See significant event note from today for further details.  Objective   Vitals:   01/18/19 0000 01/18/19 0241 01/18/19 0400 01/18/19 0600  BP: 118/83  100/70 105/72  Pulse: 89 (!) 101 98 96  Resp: (!) 23 (!) 24 20 (!) 24  Temp:   97.7 F (36.5 C)   TempSrc:   Oral   SpO2: 93% 97% 97% 97%  Weight:        Intake/Output Summary (Last 24 hours) at 01/18/2019 0741 Last data filed at 01/18/2019 6283 Gross per 24 hour  Intake 1150 ml  Output 1252 ml  Net -102 ml   Filed Weights   01/11/2019 1704  Weight: 46.5 kg    Examination:  Physical Exam Vitals and nursing note reviewed.  Constitutional:      Comments: Increased work of breathing Lethargic but awake  HENT:     Head: Normocephalic.  Mouth/Throat:     Mouth: Mucous membranes are dry.  Cardiovascular:     Rate and Rhythm: Tachycardia present.  Pulmonary:     Effort: Respiratory distress present.     Comments: Accessory muscle use Abdominal:     General: There is no distension.  Musculoskeletal:     Comments: Left lower extremity swelling  Psychiatric:     Comments: Anxious     Data Reviewed: I have personally reviewed following labs and imaging studies  CBC: Recent Labs  Lab 01/15/2019 1722 01/14/19 0304 01/14/19 1931 01/15/19 0421 01/16/19 0310 01/17/19 0428 01/18/19 0323  WBC 9.8 11.6*  --  7.6 6.1 7.2 9.3  NEUTROABS 6.5  --   --   --   --   --   --   HGB 7.1* 8.3* 7.6* 7.9* 8.2* 8.3* 8.6*  HCT 25.5* 28.2* 26.7* 27.0* 27.9* 28.5* 29.0*  MCV 85.0 88.1  --  88.8 89.4  88.8 87.3  PLT 678* 459*  --  468* 460* 466* 191*   Basic Metabolic Panel: Recent Labs  Lab 01/13/19 1900 01/14/19 0304 01/15/19 0421 01/16/19 0310 01/17/19 0428 01/18/19 0323  NA 134* 135 132* 135 134* 135  K 4.3 4.0 3.6 3.3* 3.6 3.2*  CL 108 106 108 111 109 111  CO2 16* 17* 17* 18* 18* 17*  GLUCOSE 58* 86 213* 153* 129* 142*  BUN _0 26* 51*  CREATININE 0.75 0.74 0.63 0.62 0.57 0.53  CALCIUM 8.3* 8.4* 8.0* 8.1* 8.4* 8.7*  MG 1.7  --   --   --   --   --    GFR: Estimated Creatinine Clearance: 63.1 mL/min (by C-G formula based on SCr of 0.53 mg/dL). Liver Function Tests: Recent Labs  Lab 01/14/19 0304 01/15/19 0421 01/16/19 0310 01/17/19 0428 01/18/19 0323  AST 32 27 36 46* 39  ALT _1 32 28  ALKPHOS 1,118* 1,002* 957* 957* 825*  BILITOT 1.5* 1.2 1.0 1.1 1.3*  PROT 6.0* 5.0* 5.9* 6.2* 6.2*  ALBUMIN 1.5* 1.3* 1.4* 1.4* 1.8*   No results for input(s): LIPASE, AMYLASE in the last 168 hours. Recent Labs  Lab 01/16/19 1010 01/18/19 0323  AMMONIA 59* 55*   Coagulation Profile: No results for input(s): INR, PROTIME in the last 168 hours. Cardiac Enzymes: No results for input(s): CKTOTAL, CKMB, CKMBINDEX, TROPONINI in the last 168 hours. BNP (last 3 results) No results for input(s): PROBNP in the last 8760 hours. HbA1C: No results for input(s): HGBA1C in the last 72 hours. CBG: Recent Labs  Lab 01/13/19 2107 01/16/19 0629  GLUCAP 68* 162*   Lipid Profile: No results for input(s): CHOL, HDL, LDLCALC, TRIG, CHOLHDL, LDLDIRECT in the last 72 hours. Thyroid Function Tests: No results for input(s): TSH, T4TOTAL, FREET4, T3FREE, THYROIDAB in the last 72 hours. Anemia Panel: No results for input(s): VITAMINB12, FOLATE, FERRITIN, TIBC, IRON, RETICCTPCT in the last 72 hours. Sepsis Labs: Recent Labs  Lab 12/28/2018 1730  LATICACIDVEN 0.8    Recent Results (from the past 240 hour(s))  Culture, blood (routine x 2)     Status: None   Collection Time:  01/18/2019  5:35 PM   Specimen: BLOOD LEFT HAND  Result Value Ref Range Status   Specimen Description BLOOD LEFT HAND  Final   Special Requests   Final    BOTTLES DRAWN AEROBIC AND ANAEROBIC Blood Culture adequate volume   Culture   Final    NO GROWTH 5 DAYS Performed at [facility]  Hospital Lab, [place] [address] [place], [place] [address]    Report Status 01/17/2019 FINAL  Final  Culture, blood (Routine X 2) w Reflex to ID Panel     Status: None   Collection Time: 01/04/2019  6:04 PM   Specimen: BLOOD RIGHT FOREARM  Result Value Ref Range Status   Specimen Description BLOOD RIGHT FOREARM  Final   Special Requests   Final    BOTTLES DRAWN AEROBIC AND ANAEROBIC Blood Culture adequate volume   Culture   Final    NO GROWTH 5 DAYS Performed at [facility], [place] [address], [place], [place] [address]    Report Status 01/17/2019 FINAL  Final  Respiratory Panel by RT PCR (Flu A&B, Covid) - Nasopharyngeal Swab     Status: Abnormal   Collection Time: 12/23/2018  6:26 PM   Specimen: Nasopharyngeal Swab  Result Value Ref Range Status   SARS Coronavirus 2 by RT PCR POSITIVE (A) NEGATIVE Final    Comment: RESULT CALLED TO, READ BACK BY AND VERIFIED WITH: P PEICKERT RN 01/08/2019 1948 JDW (NOTE) SARS-CoV-2 target nucleic acids are DETECTED. SARS-CoV-2 RNA is generally detectable in upper respiratory specimens  during the acute phase of infection. Positive results are indicative of the presence of the identified virus, but do not rule out bacterial infection or co-infection with other pathogens not detected by the test. Clinical correlation with patient history and other diagnostic information is necessary to determine patient infection status. The expected result is Negative. Fact Sheet for Patients:  PinkCheek.be Fact Sheet for Healthcare Providers: GravelBags.it This test is not yet approved or cleared by the [place] FDA and  has  been authorized for detection and/or diagnosis of SARS-CoV-2 by FDA under an Emergency Use Authorization (EUA).  This EUA will remain in effect (meaning this test can be used) for t he duration of  the COVID-19 declaration under Section 564(b)(1) of the Act, 21 U.S.C. section 360bbb-3(b)(1), unless the authorization is terminated or revoked sooner.    Influenza A by PCR NEGATIVE NEGATIVE Final   Influenza B by PCR NEGATIVE NEGATIVE Final    Comment: (NOTE) The Xpert Xpress SARS-CoV-2/FLU/RSV assay is intended as an aid in  the diagnosis of influenza from Nasopharyngeal swab specimens and  should not be used as a sole basis for treatment. Nasal washings and  aspirates are unacceptable for Xpert Xpress SARS-CoV-2/FLU/RSV  testing. Fact Sheet for Patients: PinkCheek.be Fact Sheet for Healthcare Providers: GravelBags.it This test is not yet approved or cleared by the [place] FDA and  has been authorized for detection and/or diagnosis of SARS-CoV-2 by  FDA under an Emergency Use Authorization (EUA). This EUA will remain  in effect (meaning this test can be used) for the duration of the  Covid-19 declaration under Section 564(b)(1) of the Act, 21  U.S.C. section 360bbb-3(b)(1), unless the authorization is  terminated or revoked. Performed at [facility], [place] [address] [place], [place] [address]   Urine culture     Status: Abnormal   Collection Time: 01/04/2019  8:22 PM   Specimen: Urine, Catheterized  Result Value Ref Range Status   Specimen Description URINE, CATHETERIZED  Final   Special Requests   Final    NONE Performed at [facility], [address], [place],  [address]    Culture >=100,000 COLONIES/mL PROTEUS MIRABILIS (A)  Final   Report Status 01/15/2019 FINAL  Final   Organism ID, Bacteria PROTEUS MIRABILIS (A)  Final  Susceptibility   Proteus mirabilis - MIC*    AMPICILLIN <=2  SENSITIVE Sensitive     CEFAZOLIN 8 SENSITIVE Sensitive     CEFTRIAXONE <=1 SENSITIVE Sensitive     CIPROFLOXACIN >=4 RESISTANT Resistant     GENTAMICIN <=1 SENSITIVE Sensitive     IMIPENEM 4 SENSITIVE Sensitive     NITROFURANTOIN 128 RESISTANT Resistant     TRIMETH/SULFA <=20 SENSITIVE Sensitive     AMPICILLIN/SULBACTAM <=2 SENSITIVE Sensitive     PIP/TAZO <=4 SENSITIVE Sensitive     * >=100,000 COLONIES/mL PROTEUS MIRABILIS  MRSA PCR Screening     Status: Abnormal   Collection Time: 01/17/19 11:36 AM   Specimen: Nasal Mucosa; Nasopharyngeal  Result Value Ref Range Status   MRSA by PCR POSITIVE (A) NEGATIVE Final    Comment:        The GeneXpert MRSA Assay (FDA approved for NASAL specimens only), is one component of a comprehensive MRSA colonization surveillance program. It is not intended to diagnose MRSA infection nor to guide or monitor treatment for MRSA infections. RESULT CALLED TO, READ BACK BY AND VERIFIED WITH: Elon Jester RN 13:40 01/17/19 ([email or web address]) Performed at [facility], [place] [address], [place], [place] [address]          Radiology Studies: CT ANGIO CHEST PE W OR WO CONTRAST  Result Date: 01/17/2019 CLINICAL DATA:  Shortness of breath. Altered mental status. Difficulty with following breathing instructions, which somewhat limits this study. EXAM: CT ANGIOGRAPHY CHEST WITH CONTRAST TECHNIQUE: Multidetector CT imaging of the chest was performed using the standard protocol during bolus administration of intravenous contrast. Multiplanar CT image reconstructions and MIPs were obtained to evaluate the vascular anatomy. CONTRAST:  80m OMNIPAQUE IOHEXOL 350 MG/ML SOLN COMPARISON:  Chest radiograph, 01/16/2019.  CTA chest, 06/09/2016. FINDINGS: Cardiovascular: There is satisfactory opacification of the pulmonary arteries to the segmental level. There is no evidence of a pulmonary embolism. Heart is top-normal in size. No pericardial effusion. Two vessel coronary  artery calcifications. Aorta is normal in caliber. No dissection. Minor aortic atherosclerosis. Mediastinum/Nodes: Well-positioned tracheostomy tube. No neck base or axillary masses or enlarged lymph nodes. 1.2 cm short axis right paratracheal lymph node, image 35, series 5. Poorly defined additional prominent lymph nodes noted along the paratracheal region and in the pre-vascular space. No mediastinal masses. No hilar masses or enlarged lymph nodes. Trachea is patent. Esophagus is unremarkable. Right PICC tip in the right atrium.  AICD leads well positioned. Lungs/Pleura: Small bilateral pleural effusions. Bilateral interstitial thickening. Patchy areas of ground-glass opacity are noted bilaterally. There is dependent opacity in the lower lobes that is consistent with atelectasis. No pneumothorax. Upper Abdomen: No acute abnormality. Musculoskeletal: Diffuse subcutaneous soft tissue edema. No fracture or bone lesion.  No chest wall masses. Review of the MIP images confirms the above findings. IMPRESSION: 1. No evidence of a pulmonary embolism. 2. Suspect pulmonary edema, likely on the basis of congestive heart failure, reflected by interstitial thickening, small bilateral effusions and patchy areas of ground-glass opacity. Infection should be considered if there are consistent clinical findings. 3. There is additional dependent opacity in the lower lobes consistent with atelectasis. 4. Two vessel coronary artery calcifications. Minor aortic atherosclerosis. Aortic Atherosclerosis (ICD10-I70.0). Electronically Signed   By: DLajean ManesM.D.   On: 01/17/2019 17:10   DG CHEST PORT 1 VIEW  Result Date: 01/16/2019 CLINICAL DATA:  48year old female with dyspnea, COVID-19 and sepsis EXAM: PORTABLE CHEST 1 VIEW COMPARISON:  Prior chest x-ray 12/31/2018 FINDINGS: Tracheostomy  tube remains in good position. The tip of the tube is midline and at the level of the clavicles. Left subclavian approach cardiac rhythm  maintenance device remains in unchanged position. Right upper extremity PICC has been placed. The tip overlies the cavoatrial junction. Cardiac and mediastinal contours are within normal limits. Inspiratory volumes are low. Small right pleural effusion. Patchy interstitial and airspace opacities present in both lower lungs and in the left mid lung. IMPRESSION: 1. Lower inspiratory volumes. 2. Developing patchy airspace opacities in the left mid lung and bilateral bases likely representing multifocal viral pneumonia. 3. Small right pleural effusion. 4. New right upper extremity PICC. Catheter tip overlies the superior cavoatrial junction. 5. Other support apparatus in stable and satisfactory position. Electronically Signed   By: Jacqulynn Cadet M.D.   On: 01/16/2019 13:49        Scheduled Meds: . Chlorhexidine Gluconate Cloth  6 each Topical Daily  . Chlorhexidine Gluconate Cloth  6 each Topical Daily  . Chlorhexidine Gluconate Cloth  6 each Topical Q0600  . dexamethasone (DECADRON) injection  6 mg Intravenous Q24H  . doxycycline  100 mg Oral Q12H  . enoxaparin (LOVENOX) injection  40 mg Subcutaneous Q24H  . feeding supplement (PRO-STAT SUGAR FREE 64)  30 mL Per Tube TID  . fentaNYL (SUBLIMAZE) injection  25 mcg Intravenous Once  . gabapentin  300 mg Oral TID  . lactulose  20 g Oral BID  . multivitamin  1 tablet Oral Daily  . mupirocin ointment  1 application Nasal BID  . nutrition supplement (JUVEN)  1 packet Oral BID BM  . potassium chloride  40 mEq Oral Once  . ramelteon  8 mg Oral QHS  . sodium chloride flush  10-40 mL Intracatheter Q12H   Continuous Infusions: . ceFEPime (MAXIPIME) IV 2 g (01/18/19 0135)  . vancomycin Stopped (01/17/19 1942)     LOS: 6 days    Time spent: 45 minutes with over 50% of the time coordinating the patient's care    Harold Hedge, DO [facility] Pager [phone]  If 7PM-7AM, please contact night-coverage [email or web address] Password  [facility] 01/18/2019, 7:41 AM

## 2019-01-18 NOTE — Consult Note (Signed)
NAME:  Angela Page, MRN:  [id number], DOB:  1970/11/14, LOS: 6 ADMISSION DATE:  01/05/2019, CONSULTATION DATE:  01/18/19 REFERRING MD:  Neysa Bonito, CHIEF COMPLAINT:  Dyspnea   Brief History   48 year old quadriplegic presenting with osteomyelitis related to multiple pressure injuries.  Incidentally found to be Covid positive.  History of present illness   48 year old quadriplegic presenting with osteomyelitis related to multiple pressure injuries.  Incidentally found to be Covid positive.  She is chronically on 3 L of oxygen through her tracheostomy.  She is not vent dependent.  Has a bunch of issues: (1) Anemia, unexplained, AC stopped until yesterday, transfused 1 unit (2) Osteomyelitis of hips/sacrum- on empiric abx, ortho does not want to bone biopsy, duration and narrowing TBD (3) Worsening hypoxemia- related to covid and possibly aspiration (4) Malpositioned suprapubic catheter- replaced by urology, no evidence of leak on CT (5) UTI vs. colonization (6) Anxiety/insomnia requiring intermittent benzodiazepines (7) Elevated Alk phos related to bony involvement/osteo (8) acute on chronic hypotension  PCCM consulted as patient deteriorated further from respiratory standpoint requiring suctioning to bring back up saturations.   Past Medical History   Past Medical History:  Diagnosis Date  . Acute on chronic respiratory failure with hypoxia ([place])   . Anasarca 06/10/2016  . Asthma   . At high risk for severe sepsis   . Cocaine use 10/08/2013  . Decubitus ulcer of buttock, unstageable ([place])   . Depression   . Diabetes mellitus without complication ([place])   . GERD (gastroesophageal reflux disease)   . HCAP (healthcare-associated pneumonia) 06/09/2016  . Hepatitis    hx of hepatits frm mono   . Kidney stone   . Lobar pneumonia ([place])   . Overdose of opiate or related narcotic ([place]) 09/02/2014  . Pacemaker   . Pleurisy   . Polysubstance dependence including opioid type drug,  episodic abuse ([place]) 10/27/2013  . Protein calorie malnutrition ([place]) 10/31/2015  . Quadriparesis ([place])   . Quadriplegia and quadriparesis ([place])   . Quadriplegia, C5-C7 incomplete ([place]) 10/08/2013  . Stage IV pressure ulcer of sacral region ([place]) 10/31/2015  . Tracheostomy status ([place])      Significant Hospital Events   01/15/2019  Consults:  ID, urology, wound care  Procedures:  12/24 [place] replaced  Significant Diagnostic Tests:  CXR 12/29 IMPRESSION: Small left pleural effusion and basilar airspace disease have worsened since the most recent plain film. Small right pleural effusion and basilar airspace disease appear unchanged.  CT Chest 12/28 IMPRESSION: 1. No evidence of a pulmonary embolism. 2. Suspect pulmonary edema, likely on the basis of congestive heart failure, reflected by interstitial thickening, small bilateral effusions and patchy areas of ground-glass opacity. Infection should be considered if there are consistent clinical findings. 3. There is additional dependent opacity in the lower lobes consistent with atelectasis. 4. Two vessel coronary artery calcifications. Minor aortic atherosclerosis.  Micro Data:  MRSA PCR positive Proteus in urine Blood cx neg COVID +  Antimicrobials:  Vancomycin 12/23 >>   Cefepime 12/23 >> Remdesivir 12/23 - 12/27 Doxycycline 12/27 >>   Interim history/subjective:  Seen in consult, too encephalopathic to answer questions  Objective   Blood pressure (!) 86/58, pulse 87, temperature (!) 94.3 F (34.6 C), temperature source Rectal, resp. rate (!) 22, weight 46.5 kg, last menstrual period 05/27/2016, SpO2 97 %.    FiO2 (%):  [28 %-55 %] 55 %   Intake/Output Summary (Last 24 hours) at 01/18/2019 1109 Last data filed at 01/18/2019 7829 Gross  per 24 hour  Intake 630 ml  Output 1252 ml  Net -622 ml   Filed Weights   01/03/2019 1704  Weight: 46.5 kg    Examination: GEN: Severely cachectic chronically ill  middle-aged woman lying in bed HEENT: Mucous membranes dry, tracheostomy in place with small amount of thick secretions CV: Heart sounds are regular, extremities are warm PULM: Lungs have scattered rhonchi to auscultation, there is occasional accessory muscle use GI: Abdomen is soft, PEG tube in place, suprapubic catheter in place EXT: There is severe muscle wasting NEURO: Quadriplegic PSYCH: RASS +1, not oriented SKIN: Skin is pale, she has large areas of skin breakdown over the sacrum and hip, PICC in place   Urine growing Proteus Basic metabolic panel showing low potassium, low bicarbonate, normal creatinine, improving alkaline phosphatase but still elevated a 25, ammonia of 55 Hemoglobin today stable at 8.6, white count stable, platelets stably elevated She is not on fingersticks for unclear reason  Resolved Hospital Problem list   N/A  Assessment & Plan:  # Acute on chronic hypoxemic respiratory failure in setting of multiple comorbidities and encephalopathy.  Differential here is worsening Covid infection, aspiration, inability to handle secretions.  I doubt she developed a blood clot between yesterday and today.  She has been started on heparin drip.  AC was held during admission for an unexplained hemoglobin drop. - Fine to continue heparin drip for now - Trend H/H - LE doppler - Transfer to ICU for suctioning needs and in case needs to be switched to cuffed trach and vent  # COVID- infiltrates on CT either viral or aspiration or low oncotic pressure edema - Finished course of dexamethasone.  # Chronic vasoplegia- midodrine, stress steroids, may need pressors and some albumin  # Acute on chronic anemia- unexplained but likely related to phlebotomy, poor nutrition, critical illness bone marrow dysfunction, stable after 1 unit - Start PPI - Monitor H/H  # Acute on chronic metabolic encephalopathy, myriad of reasons for this.  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  Bone biopsy not possible at this time due to Covid status and fraility. - Broad spectrum abx with duration TBD  # Severe protein calorie malnutrition present on admission- start TF given inability to take PO reliably  # Quadriplegia related to prior spinal cord injury  # Elevated ammonia- probably related to muscle breakdown  # Elevated GGT, abnormal CT appearance of gallbladder - RUQ [name] pending  # PEG in place- start TF, monitor for refeeding  # Multiorgan failure approaching end of life- palliative following, not sure there is much more we can do to meaningfully prolong this woman's life.  CPR and shocks will not make a difference.  DNR.  Will have Dr. Ruthann Cancer confirm   Best practice:  Diet: TF Pain/Anxiety/Delirium protocol (if indicated): Hold given encephalopathy VAP protocol (if indicated): Not needed right now DVT prophylaxis: heparin gtt GI prophylaxis: protonix Glucose control: SSI Mobility: BR Code Status: DNR Family Communication: I called mother today Disposition: ICU  Labs   CBC: Recent Labs  Lab 12/29/2018 1722 01/14/19 0304 01/14/19 1931 01/15/19 0421 01/16/19 0310 01/17/19 0428 01/18/19 0323  WBC 9.8 11.6*  --  7.6 6.1 7.2 9.3  NEUTROABS 6.5  --   --   --   --   --   --  HGB 7.1* 8.3* 7.6* 7.9* 8.2* 8.3* 8.6*  HCT 25.5* 28.2* 26.7* 27.0* 27.9* 28.5* 29.0*  MCV 85.0 88.1  --  88.8 89.4 88.8 87.3  PLT 678* 459*  --  468* 460* 466* 448*    Basic Metabolic Panel: Recent Labs  Lab 01/13/19 1900 01/14/19 0304 01/15/19 0421 01/16/19 0310 01/17/19 0428 01/18/19 0323  NA 134* 135 132* 135 134* 135  K 4.3 4.0 3.6 3.3* 3.6 3.2*  CL 108 106 108 111 109 111  CO2 16* 17* 17* 18*  18* 17*  GLUCOSE 58* 86 213* 153* 129* 142*  BUN '19 19 18 20 '$ 26* 51*  CREATININE 0.75 0.74 0.63 0.62 0.57 0.53  CALCIUM 8.3* 8.4* 8.0* 8.1* 8.4* 8.7*  MG 1.7  --   --   --   --   --    GFR: Estimated Creatinine Clearance: 63.1 mL/min (by C-G formula based on SCr of 0.53 mg/dL). Recent Labs  Lab 01/16/2019 1730 01/15/19 0421 01/16/19 0310 01/17/19 0428 01/18/19 0323  WBC  --  7.6 6.1 7.2 9.3  LATICACIDVEN 0.8  --   --   --   --     Liver Function Tests: Recent Labs  Lab 01/14/19 0304 01/15/19 0421 01/16/19 0310 01/17/19 0428 01/18/19 0323  AST 32 27 36 46* 39  ALT '27 23 24 '$ 32 28  ALKPHOS 1,118* 1,002* 957* 957* 825*  BILITOT 1.5* 1.2 1.0 1.1 1.3*  PROT 6.0* 5.0* 5.9* 6.2* 6.2*  ALBUMIN 1.5* 1.3* 1.4* 1.4* 1.8*   No results for input(s): LIPASE, AMYLASE in the last 168 hours. Recent Labs  Lab 01/16/19 1010 01/18/19 0323  AMMONIA 59* 55*    ABG    Component Value Date/Time   PHART 7.434 04/05/2018 1245   PCO2ART 28.2 (L) 04/05/2018 1245   PO2ART 107.0 04/05/2018 1245   HCO3 31.4 (H) 07/20/2018 0056   TCO2 33 (H) 07/20/2018 0056   ACIDBASEDEF 4.0 (H) 04/05/2018 1245   O2SAT 91.0 07/20/2018 0056     Coagulation Profile: No results for input(s): INR, PROTIME in the last 168 hours.  Cardiac Enzymes: No results for input(s): CKTOTAL, CKMB, CKMBINDEX, TROPONINI in the last 168 hours.  HbA1C: Hgb A1c MFr Bld  Date/Time Value Ref Range Status  07/20/2018 07:02 AM 7.1 (H) 4.8 - 5.6 % Final    Comment:    (NOTE) Pre diabetes:          5.7%-6.4% Diabetes:              >6.4% Glycemic control for   <7.0% adults with diabetes   01/02/2018 08:02 AM 8.5 (H) 4.8 - 5.6 % Final    Comment:    (NOTE) Pre diabetes:          5.7%-6.4% Diabetes:              >6.4% Glycemic control for   <7.0% adults with diabetes     CBG: Recent Labs  Lab 01/13/19 2107 01/16/19 0629  GLUCAP 68* 162*    Review of Systems:   Cannot assess due to encephalopathy  Past  Medical History  She,  has a past medical history of Acute on chronic respiratory failure with hypoxia ([place]), Anasarca (06/10/2016), Asthma, At high risk for severe sepsis, Cocaine use (10/08/2013), Decubitus ulcer of buttock, unstageable ([place]), Depression, Diabetes mellitus without complication ([place]), GERD (gastroesophageal reflux disease), HCAP (healthcare-associated pneumonia) (06/09/2016), Hepatitis, Kidney stone, Lobar pneumonia ([place]), Overdose of opiate or related narcotic ([place]) (09/02/2014), Pacemaker, Pleurisy, Polysubstance dependence  including opioid type drug, episodic abuse ([place]) (10/27/2013), Protein calorie malnutrition ([place]) (10/31/2015), Quadriparesis ([place]), Quadriplegia and quadriparesis ([place]), Quadriplegia, C5-C7 incomplete ([place]) (10/08/2013), Stage IV pressure ulcer of sacral region ([place]) (10/31/2015), and Tracheostomy status ([place]).   Surgical History    Past Surgical History:  Procedure Laterality Date  . APPENDECTOMY    . BACK SURGERY  10/08/2013   fusion of spine, cervical region  . CARDIAC SURGERY    . CYSTOSCOPY W/ URETERAL STENT PLACEMENT Right 03/09/2017   Procedure: CYSTOSCOPY [name] STENT PLACEMENT;  Surgeon: Irine Seal, MD;  Location: [facility];  Service: Urology;  Laterality: Right;  . CYSTOSCOPY W/ URETERAL STENT REMOVAL Right 07/16/2017   Procedure: CYSTOSCOPY WITH STENT REMOVAL;  Surgeon: Franchot Gallo, MD;  Location: [facility];  Service: Urology;  Laterality: Right;  . CYSTOSCOPY/URETEROSCOPY/HOLMIUM LASER/STENT PLACEMENT Right 07/16/2017   Procedure: CYSTOSCOPY/URETEROSCOPY/HOLMIUM LASER/STENT PLACEMENT/ALSO STONE EXTRACTION;  Surgeon: Franchot Gallo, MD;  Location: [facility];  Service: Urology;  Laterality: Right;  . GASTROSTOMY  11/23/2015  . I & D EXTREMITY  10/28/2011   Procedure: IRRIGATION AND DEBRIDEMENT EXTREMITY;  Surgeon: Tennis Must, MD;  Location: [facility];  Service: Orthopedics;  Laterality: Right;  Irrigation and debridement right  middle finger  . INSERTION OF SUPRAPUBIC CATHETER N/A 07/28/2016   Procedure: INSERTION OF SUPRAPUBIC CATHETER;  Surgeon: Franchot Gallo, MD;  Location: [facility];  Service: Urology;  Laterality: N/A;  . IR GENERIC HISTORICAL  11/23/2015   IR GASTROSTOMY TUBE MOD SED 11/23/2015 [facility]  . IR PATIENT EVAL TECH 0-60 MINS  12/18/2017  . IR REPLACE G-TUBE SIMPLE WO FLUORO  12/19/2017  . LITHOTRIPSY  08/2017  . TRACHEOSTOMY       Social History   reports that she has quit smoking. Her smoking use included cigarettes. She has a 25.00 pack-year smoking history. She has never used smokeless tobacco. She reports that she does not drink alcohol or use drugs.   Family History   Her family history includes Colon cancer in her maternal grandfather and mother; Hypertension in her brother and maternal uncle; Kidney failure in her maternal aunt, maternal uncle, and maternal uncle; Lung cancer in her father. There is no history of Esophageal cancer.   Allergies Allergies  Allergen Reactions  . Penicillins Hives    Has patient had a PCN reaction causing immediate rash, facial/tongue/throat swelling, SOB or lightheadedness with hypotension: Yes Has patient had a PCN reaction causing severe rash involving mucus membranes or skin necrosis: Yes Has patient had a PCN reaction that required hospitalization No Has patient had a PCN reaction occurring within the last 10 years: No-childhood allergy If all of the above answers are "NO", then may proceed with Cephalosporin  Tolerated cephalosporins in past  . Erythromycin Nausea And Vomiting  . Nitrofurantoin Monohyd Macro Nausea And Vomiting  . Oxybutynin Other (See Comments)    Dries mouth out      Home Medications  Prior to Admission medications   Medication Sig Start Date End Date Taking? Authorizing Provider  acetaminophen (TYLENOL) 500 MG tablet Take 1 tablet (500 mg total) by mouth every 8 (eight) hours as needed for mild pain. 08/24/18  Yes Hosie Poisson, MD  Amino Acids-Protein Hydrolys (FEEDING SUPPLEMENT, PRO-STAT SUGAR FREE 64,) LIQD Place 30 mLs into feeding tube 3 (three) times daily. Patient taking differently: Take 30 mLs by mouth 3 (three) times daily.  08/23/18  Yes Hosie Poisson, MD  aspirin 325 MG tablet Take 325 mg by mouth daily.  Yes [provider]  baclofen (LIORESAL) 5 mg TABS tablet Take 5 mg by mouth 3 (three) times daily.   Yes [provider]  buPROPion (WELLBUTRIN) 75 MG tablet Take 75 mg by mouth 2 (two) times daily. Via peg tube   Yes [provider]  docusate sodium (COLACE) 100 MG capsule Take 100 mg by mouth 2 (two) times daily.   Yes [provider]  Dulaglutide (TRULICITY) 1.5 PP/5.0DT SOPN Inject 1.5 mg into the skin every Friday.   Yes [provider]  DULoxetine (CYMBALTA) 60 MG capsule Take 60 mg by mouth daily.   Yes [provider]  ferrous sulfate 325 (65 FE) MG tablet Take 325 mg by mouth daily with breakfast.   Yes [provider]  gabapentin (NEURONTIN) 300 MG capsule Take 300 mg by mouth 3 (three) times daily.   Yes [provider]  insulin aspart (NOVOLOG) 100 UNIT/ML injection Inject 10 Units into the skin every 4 (four) hours. For glucose 150 to 200 use 1 units, for glucose 201-250 use 2 units, for 251 to 300 use 4 units, for 301 to 350 use 6 units, for 351 to 400 use 8 units, for 401 or greater use 10 units. Patient taking differently: Inject 0-8 Units into the skin See admin instructions. Check FSBS every 4 hours. Give insulin according to sliding scale:  0-70 call MD 71-149 0 units 150-200 1 unit 201-250 2 units 251-300 4 units 301-350 6 units 351-400 8 units >400 call MD 12/31/17  Yes Arrien, Jimmy Picket, MD  Insulin Glargine (BASAGLAR KWIKPEN) 100 UNIT/ML SOPN Inject 0.05 mLs (5 Units total) into the skin at bedtime. 04/11/18  Yes Hall, Carole N, DO  ipratropium-albuterol (DUONEB) 0.5-2.5 (3) MG/3ML SOLN Inhale 3 mLs  into the lungs 2 (two) times daily. for Wheezing 12/11/18  Yes [provider]  lactulose (CHRONULAC) 10 GM/15ML solution Take 20 g by mouth 2 (two) times daily.    Yes [provider]  loratadine (CLARITIN) 10 MG tablet Take 10 mg by mouth daily.    Yes [provider]  midodrine (PROAMATINE) 10 MG tablet Take 10 mg by mouth every 8 (eight) hours as needed (hypotension). SBP 100 or less   Yes [provider]  Multiple Vitamin (MULTIVITAMIN WITH MINERALS) TABS tablet Take by mouth daily.    Yes [provider]  nutrition supplement, JUVEN, (JUVEN) PACK Place 1 packet into feeding tube 2 (two) times daily between meals. For wound healing   Yes [provider]  omeprazole (PRILOSEC) 20 MG capsule Take 20 mg by mouth 2 (two) times daily. 01/01/19  Yes [provider]  ondansetron (ZOFRAN) 4 MG tablet Take 4 mg by mouth every 8 (eight) hours as needed for nausea or vomiting.    Yes [provider]  oxycodone (OXY-IR) 5 MG capsule Take 1 capsule (5 mg total) by mouth every 8 (eight) hours as needed for pain. 08/24/18  Yes Hosie Poisson, MD  OXYGEN Inhale 3 L into the lungs continuous.   Yes [provider]  polyethylene glycol (MIRALAX / GLYCOLAX) packet Take 17 g by mouth daily. Hold if diarrhea. Patient taking differently: Take 17 g by mouth 2 (two) times daily. Hold if diarrhea. 01/01/18  Yes Arrien, Jimmy Picket, MD  potassium chloride 20 MEQ/15ML (10%) SOLN Take 40 mEq by mouth 2 (two) times daily.   Yes [provider]  simethicone (MYLICON) 80 MG chewable tablet Chew 80 mg by mouth every 6 (  six) hours as needed for flatulence. Via peg tube   Yes [provider]  tiotropium (SPIRIVA) 18 MCG inhalation capsule Place 18 mcg into inhaler and inhale daily.   Yes [provider]  zolpidem (AMBIEN) 5 MG tablet Take 0.5 tablets (2.5 mg total) by mouth at bedtime. 08/24/18  Yes Hosie Poisson, MD    bisacodyl (DULCOLAX) 10 MG suppository Place 1 suppository (10 mg total) rectally daily as needed for mild constipation. Patient not taking: Reported on 01/13/2019 08/23/18   Hosie Poisson, MD  docusate (COLACE) 50 MG/5ML liquid Place 10 mLs (100 mg total) into feeding tube daily. Patient not taking: Reported on 10/21/2018 08/24/18   Hosie Poisson, MD  DULoxetine (CYMBALTA) 30 MG capsule Take 1 capsule (30 mg total) by mouth 2 (two) times daily. Patient not taking: Reported on 10/21/2018 02/17/18   Edwin Dada, MD  ferrous sulfate 300 (60 Fe) MG/5ML syrup Place 3.7 mLs (220 mg total) into feeding tube daily with breakfast. Patient not taking: Reported on 08/10/2018 04/11/18   Kayleen Memos, DO  lip balm (CARMEX) ointment Apply topically as needed for lip care. Patient not taking: Reported on 10/21/2018 06/27/18   Georgette Shell, MD  Nutritional Supplements (FEEDING SUPPLEMENT, GLUCERNA 1.2 CAL,) LIQD Place 1,000 mLs into feeding tube daily. Patient not taking: Reported on 01/13/2019 02/18/18   Edwin Dada, MD  Nutritional Supplements (FEEDING SUPPLEMENT, GLUCERNA 1.2 CAL,) LIQD Place 400 mLs into feeding tube daily. Patient not taking: Reported on 10/21/2018 08/24/18   Hosie Poisson, MD     Critical care time: 45 minutes

## 2019-01-18 NOTE — Progress Notes (Signed)
CRITICAL VALUE ALERT  Critical Value:  Vancomycin Pk 57  Date & Time Notied:  12/29 2334  Provider Notified: Dr. Lucile Shutters via Elta Guadel[place], RN  Orders Received/Actions taken: awaiting orders

## 2019-01-18 NOTE — Significant Event (Signed)
  Significant Event Note   Angela Page    Code Status: Full Code  [id number] DOB: 1971-01-08 DOA: 01/18/2019  PCP: Default, Provider, MD  Subjective Notified at: Roughly 10 AM Change in clinical status: Acute hypoxia and respiratory distress  Upon arrival at the bedside, I received sign out from the bedside nurse who reported the details leading up to the change in clinical status.   Patient was noted to have acute hypoxia with SPO2 down to mid to low 80s.  Had received Ativan 1 mg roughly 1.5 hours prior.  Patient was in clear respiratory distress at bedside and respiratory therapy increased O2 to max level via trach.  Continued to have respiratory distress.  Respiratory therapist began to use bag valve to trach to try and dislodge any possible mucous plugging without any improvement.  Concern was for possible underlying PE as she has not received anticoagulation until yesterday due to anemia concerning for possible bleed this hospitalization.  She was started on prophylactic heparin for this reason.  EKG done at bedside.  At/near baseline, chest x-ray ordered.  Patient was adjusted in bed and sat up with the help of bedside nurse and had improvement in her oxygenation.  PCCM was consulted throughout the course of rapid response and Dr. Tamala Julian arrived at bedside.  He was given signout and recommended transfer to ICU for closer observation for concern of decompensation.  Patient's mother was notified by phone.  Physical Exam General: Pale, increased work of breathing, lethargic MSK: Left lower extremity swollen  No disposable stethoscope near bedside, heart and lungs were not auscultated.  EKG with sinus tachycardia  Outcome Labs Ordered: None Tests Ordered: EKG, chest x-ray Consults placed: PCCM Clinical Outcome: Stabilized Disposition: Transferred to ICU  Assessment/Plan:  1. Acute hypoxic respiratory distress in setting of trach and COVID-19 infection suspect secondary to  mucous plug versus atelectasis versus pulmonary embolus vs worsened COVID-19.  Improved with sitting upright and suctioning trach -Transfer to ICU for closer observation as patient is at risk of deterioration in the next 24 hours -Started on heparin drip prophylactically  Full Note to Follow  Communication: Family updated via phone regarding patient's change in clinical status    Marva Panda, DO [facility] Pager: [phone]

## 2019-01-18 NOTE — Progress Notes (Signed)
ANTICOAGULATION CONSULT NOTE - Follow Up Consult  Pharmacy Consult for Heparin Indication: pulmonary embolus  Allergies  Allergen Reactions  . Penicillins Hives    Has patient had a PCN reaction causing immediate rash, facial/tongue/throat swelling, SOB or lightheadedness with hypotension: Yes Has patient had a PCN reaction causing severe rash involving mucus membranes or skin necrosis: Yes Has patient had a PCN reaction that required hospitalization No Has patient had a PCN reaction occurring within the last 10 years: No-childhood allergy If all of the above answers are "NO", then may proceed with Cephalosporin  Tolerated cephalosporins in past  . Erythromycin Nausea And Vomiting  . Nitrofurantoin Monohyd Macro Nausea And Vomiting  . Oxybutynin Other (See Comments)    Dries mouth out     Patient Measurements: Weight: 102 lb 8 oz (46.5 kg) Heparin Dosing Weight:  46.5 kg  Vital Signs: Temp: 97.8 F (36.6 C) (12/29 1700) Temp Source: Axillary (12/29 1700) BP: 111/91 (12/29 1900) Pulse Rate: 99 (12/29 1900)  Labs: Recent Labs    01/16/19 0310 01/17/19 0428 01/18/19 0323 01/18/19 1532 01/18/19 1844  HGB 8.2* 8.3* 8.6* 8.5*  --   HCT 27.9* 28.5* 29.0* 28.9*  --   PLT 460* 466* 448*  --   --   HEPARINUNFRC  --   --   --   --  0.36  CREATININE 0.62 0.57 0.53  --   --     Estimated Creatinine Clearance: 63.1 mL/min (by C-G formula based on SCr of 0.53 mg/dL).   Medical History: Past Medical History:  Diagnosis Date  . Acute on chronic respiratory failure with hypoxia ([place])   . Anasarca 06/10/2016  . Asthma   . At high risk for severe sepsis   . Cocaine use 10/08/2013  . Decubitus ulcer of buttock, unstageable ([place])   . Depression   . Diabetes mellitus without complication ([place])   . GERD (gastroesophageal reflux disease)   . HCAP (healthcare-associated pneumonia) 06/09/2016  . Hepatitis    hx of hepatits frm mono   . Kidney stone   . Lobar pneumonia ([place])   .  Overdose of opiate or related narcotic ([place]) 09/02/2014  . Pacemaker   . Pleurisy   . Polysubstance dependence including opioid type drug, episodic abuse ([place]) 10/27/2013  . Protein calorie malnutrition ([place]) 10/31/2015  . Quadriparesis ([place])   . Quadriplegia and quadriparesis ([place])   . Quadriplegia, C5-C7 incomplete ([place]) 10/08/2013  . Stage IV pressure ulcer of sacral region ([place]) 10/31/2015  . Tracheostomy status [facility])    Assessment: 48 yr old female presented with lethargy, COVID +, now with concern for PE.  Pt has been receiving Lovenox 40 mg SQ daily during admission; last dose at 1815 on 01/17/19).  Heparin level ~8 hrs after heparin 2500 units IV bolus X 1, followed by initiation of heparin infusion at 700 units/hr, is 0.36 units/ml, which is within the goal range for this pt. H/H 8.5/28.9, platelets 448 (CBC stable). Per RN, no issues with IV or bleeding observed.  Goal of Therapy:  Heparin level 0.3-0.7 units/ml Monitor platelets by anticoagulation protocol: Yes   Plan:  Continue heparin infusion at 700 units/hr Check confirmatory heparin level in 6 hrs Monitor daily heparin level, CBC Monitor for signs/symptoms of bleeding F/U PE work up  Gillermina Hu, PharmD, BCPS, [place] Pharmacist 01/18/2019 8:14 PM

## 2019-01-18 NOTE — Progress Notes (Signed)
NURSING PROGRESS NOTE  Macall Mulay PO:[id number] Admission Data: 01/18/2019 6:10 PM Attending Provider: Harold Hedge, MD [id number], Provider, MD Code Status: DNR   Angela Page is a 48 y.o. female patient admitted from ED:  -No acute distress noted.  -No complaints of shortness of breath.  -No complaints of chest pain.   Cardiac Monitoring: Box # [id number] in place. Cardiac monitor yields:normal sinus rhythm.  Blood pressure 94/67, pulse (!) 102, temperature 97.8 F (36.6 C), temperature source Axillary, resp. rate (!) 25, weight 46.5 kg, last menstrual period 05/27/2016, SpO2 95 %.   IV Fluids:  IV in place, occlusive dsg intact without redness, PICC  Allergies:  Penicillins, Erythromycin, Nitrofurantoin monohyd macro, and Oxybutynin  Past Medical History:   has a past medical history of Acute on chronic respiratory failure with hypoxia ([place]), Anasarca (06/10/2016), Asthma, At high risk for severe sepsis, Cocaine use (10/08/2013), Decubitus ulcer of buttock, unstageable ([place]), Depression, Diabetes mellitus without complication ([place]), GERD (gastroesophageal reflux disease), HCAP (healthcare-associated pneumonia) (06/09/2016), Hepatitis, Kidney stone, Lobar pneumonia ([place]), Overdose of opiate or related narcotic ([place]) (09/02/2014), Pacemaker, Pleurisy, Polysubstance dependence including opioid type drug, episodic abuse ([place]) (10/27/2013), Protein calorie malnutrition ([place]) (10/31/2015), Quadriparesis ([place]), Quadriplegia and quadriparesis ([place]), Quadriplegia, C5-C7 incomplete ([place]) (10/08/2013), Stage IV pressure ulcer of sacral region ([place]) (10/31/2015), and Tracheostomy status ([place]).  Past Surgical History:   has a past surgical history that includes Appendectomy; Cardiac surgery; I & D extremity (10/28/2011); ir generic historical (11/23/2015); Back surgery (10/08/2013); Tracheostomy; Gastrostomy (11/23/2015); Insertion of suprapubic catheter (N/A, 07/28/2016); Cystoscopy w/ ureteral  stent placement (Right, 03/09/2017); Cystoscopy/ureteroscopy/holmium laser/stent placement (Right, 07/16/2017); Cystoscopy w/ ureteral stent removal (Right, 07/16/2017); Lithotripsy (08/2017); IR PATIENT EVAL TECH 0-60 MINS (12/18/2017); and IR REPLACE G-TUBE SIMPLE WO FLUORO (12/19/2017).  Social History:   reports that she has quit smoking. Her smoking use included cigarettes. She has a 25.00 pack-year smoking history. She has never used smokeless tobacco. She reports that she does not drink alcohol or use drugs.  Skin: several pressure injuries see flow sheet.  Patient/Family orientated to room. Information packet given to patient/family. Admission inpatient armband information verified with patient/family to include name and date of birth and placed on patient arm. Side rails up x 2, fall assessment and education completed with patient/family. Patient/family able to verbalize understanding of risk associated with falls and verbalized understanding to call for assistance before getting out of bed. Call light within reach. Patient/family able to voice and demonstrate understanding of unit orientation instructions.    Will continue to evaluate and treat per MD orders.

## 2019-01-19 ENCOUNTER — Inpatient Hospital Stay (HOSPITAL_COMMUNITY): Payer: Medicare Other

## 2019-01-19 DIAGNOSIS — U071 COVID-19: Secondary | ICD-10-CM

## 2019-01-19 DIAGNOSIS — R609 Edema, unspecified: Secondary | ICD-10-CM

## 2019-01-19 DIAGNOSIS — J9620 Acute and chronic respiratory failure, unspecified whether with hypoxia or hypercapnia: Secondary | ICD-10-CM

## 2019-01-19 DIAGNOSIS — N390 Urinary tract infection, site not specified: Secondary | ICD-10-CM

## 2019-01-19 LAB — CBC
HCT: 28.6 % — ABNORMAL LOW (ref 36.0–46.0)
Hemoglobin: 8.3 g/dL — ABNORMAL LOW (ref 12.0–15.0)
MCH: 26.1 pg (ref 26.0–34.0)
MCHC: 29 g/dL — ABNORMAL LOW (ref 30.0–36.0)
MCV: 89.9 fL (ref 80.0–100.0)
Platelets: 583 10*3/uL — ABNORMAL HIGH (ref 150–400)
RBC: 3.18 MIL/uL — ABNORMAL LOW (ref 3.87–5.11)
RDW: 24.6 % — ABNORMAL HIGH (ref 11.5–15.5)
WBC: 11.8 10*3/uL — ABNORMAL HIGH (ref 4.0–10.5)
nRBC: 0 % (ref 0.0–0.2)

## 2019-01-19 LAB — VANCOMYCIN, RANDOM: Vancomycin Rm: 43

## 2019-01-19 LAB — COMPREHENSIVE METABOLIC PANEL
ALT: 25 U/L (ref 0–44)
AST: 39 U/L (ref 15–41)
Albumin: 3 g/dL — ABNORMAL LOW (ref 3.5–5.0)
Alkaline Phosphatase: 615 U/L — ABNORMAL HIGH (ref 38–126)
Anion gap: 7 (ref 5–15)
BUN: 87 mg/dL — ABNORMAL HIGH (ref 6–20)
CO2: 16 mmol/L — ABNORMAL LOW (ref 22–32)
Calcium: 9.2 mg/dL (ref 8.9–10.3)
Chloride: 115 mmol/L — ABNORMAL HIGH (ref 98–111)
Creatinine, Ser: 0.94 mg/dL (ref 0.44–1.00)
GFR calc Af Amer: >60 mL/min (ref >60)
GFR calc non Af Amer: >60 mL/min (ref >60)
Glucose, Bld: 201 mg/dL — ABNORMAL HIGH (ref 70–99)
Potassium: 4.2 mmol/L (ref 3.5–5.1)
Sodium: 138 mmol/L (ref 135–145)
Total Bilirubin: 1.2 mg/dL (ref 0.3–1.2)
Total Protein: 6.4 g/dL — ABNORMAL LOW (ref 6.5–8.1)

## 2019-01-19 LAB — GLUCOSE, CAPILLARY
Glucose-Capillary: 92 mg/dL (ref 70–99)
Glucose-Capillary: 72 mg/dL (ref 70–99)
Glucose-Capillary: 107 mg/dL — ABNORMAL HIGH (ref 70–99)
Glucose-Capillary: 174 mg/dL — ABNORMAL HIGH (ref 70–99)
Glucose-Capillary: 213 mg/dL — ABNORMAL HIGH (ref 70–99)

## 2019-01-19 LAB — HEPARIN LEVEL (UNFRACTIONATED): Heparin Unfractionated: 0.36 IU/mL (ref 0.30–0.70)

## 2019-01-19 LAB — MAGNESIUM
Magnesium: 1.6 mg/dL — ABNORMAL LOW (ref 1.7–2.4)
Magnesium: 2.2 mg/dL (ref 1.7–2.4)

## 2019-01-19 LAB — PHOSPHORUS
Phosphorus: 2.8 mg/dL (ref 2.5–4.6)
Phosphorus: 2.9 mg/dL (ref 2.5–4.6)

## 2019-01-19 MED ORDER — MELATONIN 3 MG PO TABS
3.0000 mg | ORAL_TABLET | Freq: Every evening | ORAL | Status: DC | PRN
  Filled 2019-01-19: qty 1

## 2019-01-19 MED ORDER — VANCOMYCIN VARIABLE DOSE PER UNSTABLE RENAL FUNCTION (PHARMACIST DOSING): Status: DC

## 2019-01-19 MED ORDER — MIRABEGRON ER 50 MG PO TB24
50.0000 mg | ORAL_TABLET | Freq: Every day | ORAL | Status: DC
  Administered 2019-01-19 – 2019-01-30 (×10): 50 mg via ORAL
  Filled 2019-01-19 (×14): qty 1

## 2019-01-19 MED ORDER — VITAL AF 1.2 CAL PO LIQD: 1000.0000 mL | ORAL | Status: DC

## 2019-01-19 MED ORDER — OXYCODONE HCL 5 MG PO TABS
5.0000 mg | ORAL_TABLET | Freq: Three times a day (TID) | ORAL | Status: DC | PRN
  Administered 2019-01-19: 5 mg

## 2019-01-19 MED ORDER — JUVEN PO PACK
1.0000 | PACK | Freq: Two times a day (BID) | ORAL | Status: DC
  Administered 2019-01-19 – 2019-02-02 (×28): 1
  Filled 2019-01-19 (×27): qty 1

## 2019-01-19 MED ORDER — GABAPENTIN 250 MG/5ML PO SOLN
200.0000 mg | Freq: Three times a day (TID) | ORAL | Status: DC
  Administered 2019-01-19 – 2019-01-21 (×5): 200 mg
  Filled 2019-01-19 (×7): qty 4

## 2019-01-19 MED ORDER — PANTOPRAZOLE SODIUM 40 MG PO PACK
40.0000 mg | PACK | Freq: Every day | ORAL | Status: DC
  Administered 2019-01-19 – 2019-02-04 (×17): 40 mg
  Filled 2019-01-19 (×17): qty 20

## 2019-01-19 MED ORDER — MIDODRINE HCL 5 MG PO TABS
10.0000 mg | ORAL_TABLET | Freq: Three times a day (TID) | ORAL | Status: DC
  Administered 2019-01-19 – 2019-02-02 (×40): 10 mg
  Filled 2019-01-19 (×41): qty 2

## 2019-01-19 MED ORDER — HEPARIN SODIUM (PORCINE) 5000 UNIT/ML IJ SOLN
5000.0000 [IU] | Freq: Three times a day (TID) | INTRAMUSCULAR | Status: DC
  Administered 2019-01-19 – 2019-02-02 (×41): 5000 [IU] via SUBCUTANEOUS
  Filled 2019-01-19 (×40): qty 1

## 2019-01-19 MED ORDER — VITAL AF 1.2 CAL PO LIQD
1000.0000 mL | ORAL | Status: DC
  Administered 2019-01-19 – 2019-01-21 (×2): 1000 mL

## 2019-01-19 MED ORDER — LACTATED RINGERS IV BOLUS: 250.0000 mL | Freq: Once | INTRAVENOUS | Status: DC

## 2019-01-19 MED ORDER — MAGNESIUM SULFATE 2 GM/50ML IV SOLN
2.0000 g | Freq: Once | INTRAVENOUS | Status: AC
  Administered 2019-01-19: 2 g via INTRAVENOUS
  Filled 2019-01-19: qty 50

## 2019-01-19 MED ORDER — ADULT MULTIVITAMIN LIQUID CH
15.0000 mL | Freq: Every day | ORAL | Status: DC
  Administered 2019-01-19 – 2019-02-05 (×17): 15 mL
  Filled 2019-01-19 (×18): qty 15

## 2019-01-19 MED ORDER — SODIUM CHLORIDE 0.9 % IV SOLN
2.0000 g | Freq: Two times a day (BID) | INTRAVENOUS | Status: DC
  Administered 2019-01-20 – 2019-01-25 (×11): 2 g via INTRAVENOUS
  Filled 2019-01-19 (×11): qty 2

## 2019-01-19 NOTE — Progress Notes (Addendum)
PROGRESS NOTE    Angela Page  [id number] DOB: 1970/09/08 DOA: 12/23/2018 PCP: Default, Provider, MD   Brief Narrative: 48 year old with past medical history significant for history of quadriplegia following spinal cord injury with trach and PEG dependency, chronically on 3 L, chronic diastolic heart failure, neurogenic orthostatic hypotension, COPD who presents from facility with concerns of lethargy.  Patient is able to nod to yes or notoquestions andattempts to answer questions although difficult to hear due to her tracheostomy. Reportedly she was sent over by staff due to lethargy and concerns for sepsis. She was found to be hypoxic down to 80% on 4 L and had to be placed on nonrebreather by EMS.   CT abdomen and pelvis showing suprapubic catheter appearing to protrude through posterior bladder wall with fluid in the pelvis.   Urology was consulted, SPT replaced by urology on 12/24, continue gravity drainage, return to normal interval exchanges at facility and CT cystogram to evaluate bladder.   Left tibia/fibula x-ray showing osseous demineralization concerning for osteomyelitis and orthopedic surgery was consulted who recommended ID consult, wound care and conservative management and did not recommend surgery at this time.  On 12/29 patient acutely decompensated with increased respiratory distress and oxygen requirement and started on heparin drip apophylactically.  She was evaluated by CCM and recommendation was to check Dopplers and discontinue heparin drip.  Dopplers have been negative for DVT  Assessment & Plan:   Active Problems:   Quadriplegia ([place])   Decubitus ulcer of sacral region, stage 4 (HCC)   Anemia, iron deficiency   Tracheostomy status (HCC)   PEG (percutaneous endoscopic gastrostomy) status ([place])   Complicated UTI (urinary tract infection)   Sepsis (HCC)   Hyperkalemia   COVID-19   Osteomyelitis of left fibula (HCC)   Palliative care  encounter   1-Acute on chronic hypoxic respiratory failure in the setting of Covid infection with tracheostomy, Acutely decompensated the morning of 12/29 likely related to mucous plug. Heparin drip has been discontinued. CTA 12/28 - for PE Continue with IV Decadron and remdesivir Appreciate Dr. Chase Caller evaluation.  Elevated ammonia: Continue with lactulose  Anxiety insomnia: Ativan discontinue as my have contributed to her lethargy Continue with Rozerem  Cellulitis of the back: Continue with vancomycin and cefepime per ID  Osteomyelitis of the left fibula, left greater trochanter, distal sacrum and left hip Orthopedic was consulted recommend medical management no surgical indication ID consulted who recommended continue vancomycin and cefepime ID  recommending long-term p.o. antibiotics with Bactrim at discharge.  Proteus UTI with suprapubic catheter: CT abdomen and pelvis showing suprapubic catheter appearing to protrude through posterior bladder wall with fluid in the pelvis.   Urology was consulted, SPT replaced by urology on 12/24, continue gravity drainage, return to normal interval exchanges at facility and CT cystogram to evaluate bladder.  Continue with IV antibiotics Discussed with urology, will try myrbetriq for bladder spasm and help with leaking.   Stage IV decubitus ulcer: Continue with wound care.  Hypotension; chronic Vaso-plegia;  Received albumin.  Started on midodrine.  On hydrocortisone.  Might need florinef.  Sick sinus syndrome status post pacemaker PEG tube dependent continue with supplementation.  Elevated liver function test: Labs pending for the morning.  [name]; Worsening Gallbladder wall thickening, measuring u to 8 mm in thickness, negative murphy sign.  Will consult sx.    Quadriplegic due to spinal cord injury Poor prognosis with ongoing infections -Palliative care consulted.  Acute on Chronic anemia status post 1 unit PRBCs  Unclear  etiology.  Stable : Replete IV  Anemia; Acute on Chronic.  Hb stable.  Received one unit PRBC>    Pressure Injury 07/20/18 Sacrum (Active)  07/20/18 0146  Location: Sacrum  Location Orientation:   Staging:   Wound Description (Comments):   Present on Admission:      Pressure Injury Ischial tuberosity Left (Active)     Location: Ischial tuberosity  Location Orientation: Left  Staging:   Wound Description (Comments):   Present on Admission:      Pressure Injury Ischial tuberosity Right (Active)     Location: Ischial tuberosity  Location Orientation: Right  Staging:   Wound Description (Comments):   Present on Admission:      Pressure Injury 01/15/19 Vertebral column Left (Active)  01/15/19 0540  Location: Vertebral column  Location Orientation: Left  Staging:   Wound Description (Comments):   Present on Admission: Yes     Pressure Injury 01/15/19 Hip Right Stage 2 -  Partial thickness loss of dermis presenting as a shallow open injury with a red, pink wound bed without slough. (Active)  01/15/19 1710  Location: Hip  Location Orientation: Right  Staging: Stage 2 -  Partial thickness loss of dermis presenting as a shallow open injury with a red, pink wound bed without slough.  Wound Description (Comments):   Present on Admission: Yes     Nutrition Problem: Increased nutrient needs Etiology: chronic illness, acute illness(acute on chronic hypoxia in the setting of COVID-19 infection with tracheostomy; osteomyelitis of left fibula;greater trochanter, stage IV decubitis ulcer; UTI)    Signs/Symptoms: estimated needs    Interventions: Tube feeding, Prostat, MVI, Juven  Estimated body mass index is 18.75 kg/m as calculated from the following:   Height as of 08/11/18: 5\' 2"  (1.575 m).   Weight as of this encounter: 46.5 kg.   DVT prophylaxis: Heparin  Code Status: partial code Family Communication: updated today by CCM Disposition Plan: remain in the  hospital for treatment of resp failure and hypoxemia.  Consultants:   CCM    Procedures:     Antimicrobials:    Subjective: Alert, nod with her head.    Objective: Vitals:   01/19/19 0600 01/19/19 0700 01/19/19 0729 01/19/19 1258  BP: 98/70 95/71 95/71    Pulse: 76 80 81 79  Resp: 17 16 17 18   Temp:      TempSrc:      SpO2: 99% 99% 99% 100%  Weight:        Intake/Output Summary (Last 24 hours) at 01/19/2019 1554 Last data filed at 01/19/2019 1000 Gross per 24 hour  Intake 2875.89 ml  Output 925 ml  Net 1950.89 ml   Filed Weights   01/08/2019 1704  Weight: 46.5 kg    Examination:  General exam: alert,  Respiratory system: trach in place. Bilateral air movement.  Cardiovascular system: S1 & S2 heard, RRR.  Gastrointestinal system: Abdomen is nondistended, soft and nontender. No organomegaly or masses felt. Normal bowel sounds heard. Peg tube in place.  Central nervous system: Alert  Extremities: Sno edema     Data Reviewed: I have personally reviewed following labs and imaging studies  CBC: Recent Labs  Lab 12/21/2018 1722 01/15/19 0421 01/16/19 0310 01/17/19 0428 01/18/19 0323 01/18/19 1532 01/18/19 2155 01/19/19 0417  WBC 9.8 7.6 6.1 7.2 9.3  --   --  11.8*  NEUTROABS 6.5  --   --   --   --   --   --   --  HGB 7.1* 7.9* 8.2* 8.3* 8.6* 8.5* 9.2* 8.3*  HCT 25.5* 27.0* 27.9* 28.5* 29.0* 28.9* 27.0* 28.6*  MCV 85.0 88.8 89.4 88.8 87.3  --   --  89.9  PLT 678* 468* 460* 466* 448*  --   --  [id number]*   Basic Metabolic Panel: Recent Labs  Lab 01/13/19 1900 01/14/19 0304 01/15/19 0421 01/16/19 0310 01/17/19 0428 01/18/19 0323 01/18/19 1322 01/18/19 1844 01/18/19 2155 01/19/19 0417  NA 134* 135 132* 135 134* 135  --   --  148*  --   K 4.3 4.0 3.6 3.3* 3.6 3.2*  --   --  4.6  --   CL 108 106 108 111 109 111  --   --   --   --   CO2 16* 17* 17* 18* 18* 17*  --   --   --   --   GLUCOSE 58* 86 213* 153* 129* 142*  --   --   --   --   BUN 19 19 18  20  26* 51*  --   --   --   --   CREATININE 0.75 0.74 0.63 0.62 0.57 0.53  --   --   --   --   CALCIUM 8.3* 8.4* 8.0* 8.1* 8.4* 8.7*  --   --   --   --   MG 1.7  --   --   --   --   --  1.5* 1.4*  --  1.6*  PHOS  --   --   --   --   --   --  3.0 2.5  --  2.8   GFR: Estimated Creatinine Clearance: 63.1 mL/min (by C-G formula based on SCr of 0.53 mg/dL). Liver Function Tests: Recent Labs  Lab 01/14/19 0304 01/15/19 0421 01/16/19 0310 01/17/19 0428 01/18/19 0323  AST 32 27 36 46* 39  ALT 27 23 24  32 28  ALKPHOS 1,118* 1,002* 957* 957* 825*  BILITOT 1.5* 1.2 1.0 1.1 1.3*  PROT 6.0* 5.0* 5.9* 6.2* 6.2*  ALBUMIN 1.5* 1.3* 1.4* 1.4* 1.8*   No results for input(s): LIPASE, AMYLASE in the last 168 hours. Recent Labs  Lab 01/16/19 1010 01/18/19 0323  AMMONIA 59* 55*   Coagulation Profile: No results for input(s): INR, PROTIME in the last 168 hours. Cardiac Enzymes: No results for input(s): CKTOTAL, CKMB, CKMBINDEX, TROPONINI in the last 168 hours. BNP (last 3 results) No results for input(s): PROBNP in the last 8760 hours. HbA1C: No results for input(s): HGBA1C in the last 72 hours. CBG: Recent Labs  Lab 01/18/19 2331 01/19/19 0423 01/19/19 0822 01/19/19 1142 01/19/19 1550  GLUCAP 159* 92 72 107* 174*   Lipid Profile: No results for input(s): CHOL, HDL, LDLCALC, TRIG, CHOLHDL, LDLDIRECT in the last 72 hours. Thyroid Function Tests: No results for input(s): TSH, T4TOTAL, FREET4, T3FREE, THYROIDAB in the last 72 hours. Anemia Panel: No results for input(s): VITAMINB12, FOLATE, FERRITIN, TIBC, IRON, RETICCTPCT in the last 72 hours. Sepsis Labs: Recent Labs  Lab 01/07/2019 1730  LATICACIDVEN 0.8    Recent Results (from the past 240 hour(s))  Culture, blood (routine x 2)     Status: None   Collection Time: 01/03/2019  5:35 PM   Specimen: BLOOD LEFT HAND  Result Value Ref Range Status   Specimen Description BLOOD LEFT HAND  Final   Special Requests   Final    BOTTLES  DRAWN AEROBIC AND ANAEROBIC Blood Culture adequate volume   Culture  Final    NO GROWTH 5 DAYS Performed at [facility], [place] [address] [place], [place] [address]    Report Status 01/17/2019 FINAL  Final  Culture, blood (Routine X 2) w Reflex to ID Panel     Status: None   Collection Time: 01/11/2019  6:04 PM   Specimen: BLOOD RIGHT FOREARM  Result Value Ref Range Status   Specimen Description BLOOD RIGHT FOREARM  Final   Special Requests   Final    BOTTLES DRAWN AEROBIC AND ANAEROBIC Blood Culture adequate volume   Culture   Final    NO GROWTH 5 DAYS Performed at [facility], [place] [address] [place], [place] [address]    Report Status 01/17/2019 FINAL  Final  Respiratory Panel by RT PCR (Flu A&B, Covid) - Nasopharyngeal Swab     Status: Abnormal   Collection Time: 01/14/2019  6:26 PM   Specimen: Nasopharyngeal Swab  Result Value Ref Range Status   SARS Coronavirus 2 by RT PCR POSITIVE (A) NEGATIVE Final    Comment: RESULT CALLED TO, READ BACK BY AND VERIFIED WITH: P PEICKERT RN 01/01/2019 1948 JDW (NOTE) SARS-CoV-2 target nucleic acids are DETECTED. SARS-CoV-2 RNA is generally detectable in upper respiratory specimens  during the acute phase of infection. Positive results are indicative of the presence of the identified virus, but do not rule out bacterial infection or co-infection with other pathogens not detected by the test. Clinical correlation with patient history and other diagnostic information is necessary to determine patient infection status. The expected result is Negative. Fact Sheet for Patients:  PinkCheek.be Fact Sheet for Healthcare Providers: GravelBags.it This test is not yet approved or cleared by the [place] FDA and  has been authorized for detection and/or diagnosis of SARS-CoV-2 by FDA under an Emergency Use Authorization (EUA).  This EUA will remain in effect (meaning this test  can be used) for t he duration of  the COVID-19 declaration under Section 564(b)(1) of the Act, 21 U.S.C. section 360bbb-3(b)(1), unless the authorization is terminated or revoked sooner.    Influenza A by PCR NEGATIVE NEGATIVE Final   Influenza B by PCR NEGATIVE NEGATIVE Final    Comment: (NOTE) The Xpert Xpress SARS-CoV-2/FLU/RSV assay is intended as an aid in  the diagnosis of influenza from Nasopharyngeal swab specimens and  should not be used as a sole basis for treatment. Nasal washings and  aspirates are unacceptable for Xpert Xpress SARS-CoV-2/FLU/RSV  testing. Fact Sheet for Patients: PinkCheek.be Fact Sheet for Healthcare Providers: GravelBags.it This test is not yet approved or cleared by the [place] FDA and  has been authorized for detection and/or diagnosis of SARS-CoV-2 by  FDA under an Emergency Use Authorization (EUA). This EUA will remain  in effect (meaning this test can be used) for the duration of the  Covid-19 declaration under Section 564(b)(1) of the Act, 21  U.S.C. section 360bbb-3(b)(1), unless the authorization is  terminated or revoked. Performed at [facility], [place] [address] [place], [place] [address]   Urine culture     Status: Abnormal   Collection Time: 01/13/2019  8:22 PM   Specimen: Urine, Catheterized  Result Value Ref Range Status   Specimen Description URINE, CATHETERIZED  Final   Special Requests   Final    NONE Performed at [facility], [address] [place], [place] [address]    Culture >=100,000 COLONIES/mL PROTEUS MIRABILIS (A)  Final   Report Status 01/15/2019 FINAL  Final   Organism ID, Bacteria PROTEUS MIRABILIS (A)  Final      Susceptibility   Proteus mirabilis - MIC*    AMPICILLIN <=2 SENSITIVE Sensitive     CEFAZOLIN 8 SENSITIVE Sensitive     CEFTRIAXONE <=1 SENSITIVE Sensitive     CIPROFLOXACIN >=4 RESISTANT Resistant     GENTAMICIN <=1 SENSITIVE  Sensitive     IMIPENEM 4 SENSITIVE Sensitive     NITROFURANTOIN 128 RESISTANT Resistant     TRIMETH/SULFA <=20 SENSITIVE Sensitive     AMPICILLIN/SULBACTAM <=2 SENSITIVE Sensitive     PIP/TAZO <=4 SENSITIVE Sensitive     * >=100,000 COLONIES/mL PROTEUS MIRABILIS  MRSA PCR Screening     Status: Abnormal   Collection Time: 01/17/19 11:36 AM   Specimen: Nasal Mucosa; Nasopharyngeal  Result Value Ref Range Status   MRSA by PCR POSITIVE (A) NEGATIVE Final    Comment:        The GeneXpert MRSA Assay (FDA approved for NASAL specimens only), is one component of a comprehensive MRSA colonization surveillance program. It is not intended to diagnose MRSA infection nor to guide or monitor treatment for MRSA infections. RESULT CALLED TO, READ BACK BY AND VERIFIED WITH: Elon Jester RN 13:40 01/17/19 ([email or web address]) Performed at [facility], [place] [address] [place], [place] [address]          Radiology Studies: CT ANGIO CHEST PE W OR WO CONTRAST  Result Date: 01/17/2019 CLINICAL DATA:  Shortness of breath. Altered mental status. Difficulty with following breathing instructions, which somewhat limits this study. EXAM: CT ANGIOGRAPHY CHEST WITH CONTRAST TECHNIQUE: Multidetector CT imaging of the chest was performed using the standard protocol during bolus administration of intravenous contrast. Multiplanar CT image reconstructions and MIPs were obtained to evaluate the vascular anatomy. CONTRAST:  12mL OMNIPAQUE IOHEXOL 350 MG/ML SOLN COMPARISON:  Chest radiograph, 01/16/2019.  CTA chest, 06/09/2016. FINDINGS: Cardiovascular: There is satisfactory opacification of the pulmonary arteries to the segmental level. There is no evidence of a pulmonary embolism. Heart is top-normal in size. No pericardial effusion. Two vessel coronary artery calcifications. Aorta is normal in caliber. No dissection. Minor aortic atherosclerosis. Mediastinum/Nodes: Well-positioned tracheostomy tube. No neck base or  axillary masses or enlarged lymph nodes. 1.2 cm short axis right paratracheal lymph node, image 35, series 5. Poorly defined additional prominent lymph nodes noted along the paratracheal region and in the pre-vascular space. No mediastinal masses. No hilar masses or enlarged lymph nodes. Trachea is patent. Esophagus is unremarkable. Right PICC tip in the right atrium.  AICD leads well positioned. Lungs/Pleura: Small bilateral pleural effusions. Bilateral interstitial thickening. Patchy areas of ground-glass opacity are noted bilaterally. There is dependent opacity in the lower lobes that is consistent with atelectasis. No pneumothorax. Upper Abdomen: No acute abnormality. Musculoskeletal: Diffuse subcutaneous soft tissue edema. No fracture or bone lesion.  No chest wall masses. Review of the MIP images confirms the above findings. IMPRESSION: 1. No evidence of a pulmonary embolism. 2. Suspect pulmonary edema, likely on the basis of congestive heart failure, reflected by interstitial thickening, small bilateral effusions and patchy areas of ground-glass opacity. Infection should be considered if there are consistent clinical findings. 3. There is additional dependent opacity in the lower lobes consistent with atelectasis. 4. Two vessel coronary artery calcifications. Minor aortic atherosclerosis. Aortic Atherosclerosis (ICD10-I70.0). Electronically Signed   By: Lajean Manes M.D.   On: 01/17/2019 17:10   DG CHEST PORT 1 VIEW  Result Date: 01/18/2019 CLINICAL DATA:  Shortness of breath today.  EXAM: PORTABLE CHEST 1 VIEW COMPARISON:  Single-view of the chest 01/16/2019 and 08/19/2018. CT chest 01/17/2019. FINDINGS: Tracheostomy tube, right PICC and pacing device are unchanged. The patient has small pleural effusions and basilar airspace disease, worse on the left where the effusion and airspace disease have progressed since the most recent plain film. No pneumothorax. There is mild cardiomegaly. No acute or focal  bony abnormality. IMPRESSION: Small left pleural effusion and basilar airspace disease have worsened since the most recent plain film. Small right pleural effusion and basilar airspace disease appear unchanged. Electronically Signed   By: Inge Rise M.D.   On: 01/18/2019 11:01   VAS [name] LOWER EXTREMITY VENOUS (DVT)  Result Date: 01/19/2019  Lower Venous Study Indications: Hypoxia.  Risk Factors: COVID 19 positive. Limitations: Body habitus, poor ultrasound/tissue interface, bandages and patient positioning, patient immobility, patient contractures. Comparison Study: No prior studies. Performing Technologist: Oliver Hum RVT  Examination Guidelines: A complete evaluation includes B-mode imaging, spectral Doppler, color Doppler, and power Doppler as needed of all accessible portions of each vessel. Bilateral testing is considered an integral part of a complete examination. Limited examinations for reoccurring indications may be performed as noted.  +---------+---------------+---------+-----------+----------+--------------+ RIGHT    CompressibilityPhasicitySpontaneityPropertiesThrombus Aging +---------+---------------+---------+-----------+----------+--------------+ CFV      Full           Yes      Yes                                 +---------+---------------+---------+-----------+----------+--------------+ SFJ      Full                                                        +---------+---------------+---------+-----------+----------+--------------+ FV Prox  Full                                                        +---------+---------------+---------+-----------+----------+--------------+ FV Mid   Full                                                        +---------+---------------+---------+-----------+----------+--------------+ FV DistalFull                                                         +---------+---------------+---------+-----------+----------+--------------+ PFV      Full                                                        +---------+---------------+---------+-----------+----------+--------------+ POP      Full           Yes      Yes                                 +---------+---------------+---------+-----------+----------+--------------+  PTV      Full                                                        +---------+---------------+---------+-----------+----------+--------------+ PERO     Full                                                        +---------+---------------+---------+-----------+----------+--------------+   +---------+---------------+---------+-----------+----------+--------------+ LEFT     CompressibilityPhasicitySpontaneityPropertiesThrombus Aging +---------+---------------+---------+-----------+----------+--------------+ CFV      Full           Yes      Yes                                 +---------+---------------+---------+-----------+----------+--------------+ SFJ      Full                                                        +---------+---------------+---------+-----------+----------+--------------+ FV Prox  Full                                                        +---------+---------------+---------+-----------+----------+--------------+ FV Mid   Full                                                        +---------+---------------+---------+-----------+----------+--------------+ FV DistalFull                                                        +---------+---------------+---------+-----------+----------+--------------+ PFV      Full                                                        +---------+---------------+---------+-----------+----------+--------------+ POP      Full           Yes      Yes                                  +---------+---------------+---------+-----------+----------+--------------+ PTV      Full                                                        +---------+---------------+---------+-----------+----------+--------------+  PERO     Full                                                        +---------+---------------+---------+-----------+----------+--------------+     Summary: Right: There is no evidence of deep vein thrombosis in the lower extremity. No cystic structure found in the popliteal fossa. Left: There is no evidence of deep vein thrombosis in the lower extremity. No cystic structure found in the popliteal fossa.  *See table(s) above for measurements and observations.    Preliminary    VAS [name] UPPER EXTREMITY VENOUS DUPLEX  Result Date: 01/19/2019 UPPER VENOUS STUDY  Indications: Edema Risk Factors: COVID 19 positive. Limitations: Poor ultrasound/tissue interface, body habitus, patient positioning, patient immobility, patient contractures, bandages and line. Comparison Study: No prior studies. Performing Technologist: Oliver Hum RVT  Examination Guidelines: A complete evaluation includes B-mode imaging, spectral Doppler, color Doppler, and power Doppler as needed of all accessible portions of each vessel. Bilateral testing is considered an integral part of a complete examination. Limited examinations for reoccurring indications may be performed as noted.  Right Findings: +----------+------------+---------+-----------+----------+--------------+ RIGHT     CompressiblePhasicitySpontaneousProperties   Summary     +----------+------------+---------+-----------+----------+--------------+ IJV           Full       Yes       Yes                             +----------+------------+---------+-----------+----------+--------------+ Subclavian    Full       Yes       Yes                             +----------+------------+---------+-----------+----------+--------------+  Axillary      Full       Yes       Yes                             +----------+------------+---------+-----------+----------+--------------+ Brachial                                            Not visualized +----------+------------+---------+-----------+----------+--------------+ Radial        Full                                                 +----------+------------+---------+-----------+----------+--------------+ Ulnar         Full                                                 +----------+------------+---------+-----------+----------+--------------+ Cephalic      Full                                                 +----------+------------+---------+-----------+----------+--------------+  Basilic       Full                                                 +----------+------------+---------+-----------+----------+--------------+  Left Findings: +----------+------------+---------+-----------+----------+-------+ LEFT      CompressiblePhasicitySpontaneousPropertiesSummary +----------+------------+---------+-----------+----------+-------+ IJV           Full       Yes       Yes                      +----------+------------+---------+-----------+----------+-------+ Subclavian    Full       Yes       Yes                      +----------+------------+---------+-----------+----------+-------+ Axillary      Full       Yes       Yes                      +----------+------------+---------+-----------+----------+-------+ Brachial      Full       Yes       Yes                      +----------+------------+---------+-----------+----------+-------+ Radial        Full                                          +----------+------------+---------+-----------+----------+-------+ Ulnar         Full                                          +----------+------------+---------+-----------+----------+-------+ Cephalic      Full                                           +----------+------------+---------+-----------+----------+-------+ Basilic       Full                                          +----------+------------+---------+-----------+----------+-------+  Summary:  Right: No evidence of deep vein thrombosis in the upper extremity. No evidence of superficial vein thrombosis in the upper extremity.  Left: No evidence of deep vein thrombosis in the upper extremity. No evidence of superficial vein thrombosis in the upper extremity.  *See table(s) above for measurements and observations.    Preliminary    US Abdomen Limited RUQ  Result Date: 01/19/2019 CLINICAL DATA:  Elevated alkaline phosphatase EXAM: ULTRASOUND ABDOMEN LIMITED RIGHT UPPER QUADRANT COMPARISON:  Ultrasound 01/15/2019.  Chest CT 01/17/2019 FINDINGS: Gallbladder: Marked gallbladder wall thickening measuring up to 8 mm, worsening since prior study. Small mobile gallstones, the largest 7 mm. Common bile duct: Diameter: Normal caliber, 4 mm Liver: No focal lesion identified. Within normal limits in parenchymal echogenicity. Portal vein is patent on color Doppler imaging with normal direction of blood flow towards the liver. Other: Small amount of perihepatic  ascites. IMPRESSION: Worsening gallbladder wall thickening, now measuring up to 8 mm in thickness. There are small gallstones. Negative sonographic Murphy's sign. If further evaluation is felt warranted, nuclear medicine hepatobiliary scan may be helpful to assess for cystic duct patency. Small amount of perihepatic ascites. Electronically Signed   By: Rolm Baptise M.D.   On: 01/19/2019 08:41        Scheduled Meds: . chlorhexidine  15 mL Mouth Rinse BID  . Chlorhexidine Gluconate Cloth  6 each Topical Daily  . gabapentin  200 mg Per Tube Q8H  . guaiFENesin  15 mL Per Tube Q6H  . heparin injection (subcutaneous)  5,000 Units Subcutaneous Q8H  . hydrocortisone sod succinate (SOLU-CORTEF) inj  50 mg Intravenous Q6H   . insulin aspart  0-24 Units Subcutaneous Q4H  . mouth rinse  15 mL Mouth Rinse q12n4p  . midodrine  10 mg Per Tube TID WC  . multivitamin  15 mL Per Tube Daily  . mupirocin ointment  1 application Nasal BID  . nutrition supplement (JUVEN)  1 packet Per Tube BID BM  . pantoprazole sodium  40 mg Per Tube Daily  . ramelteon  8 mg Oral QHS  . sodium chloride flush  10-40 mL Intracatheter Q12H   Continuous Infusions: . sodium chloride    . ceFEPime (MAXIPIME) IV 2 g (01/19/19 0946)  . feeding supplement (VITAL AF 1.2 CAL) 1,000 mL (01/19/19 1356)     LOS: 7 days    Time spent: 35 minutes.     Elmarie Shiley, MD [facility]   If 7PM-7AM, please contact night-coverage [email or web address] Password [email or web address] 01/19/2019, 3:54 PM

## 2019-01-19 NOTE — Progress Notes (Signed)
Palliative follow up.  Chart reviewed.  Patient off pressors and stabilizing after what was a likely mucous plug.   Called Angela Page to touch base.  She was very appreciative of Dr. Golden Pop call.   Discussed Angela Page's condition with Angela Page.  No further questions.   She would very much like to video conference with Angela Page if possible.  Discussed with ICU RN Angela Page who is happy to facilitate E-Link Video call.  Palliative will continue to follow with you in a supportive fashion.  Florentina Jenny, PA-C Palliative Medicine Office:  [phone]  Greater than 50%  of this time was spent counseling and coordinating care related to the above assessment and plan.  The above conversation was completed via telephone due to the restrictions during the COVID-19 pandemic. Thorough chart review and discussion with necessary members of the care team was completed as part of assessment. All issues were discussed and addressed but no physical exam was performed.  Time 15. Min.

## 2019-01-19 NOTE — Progress Notes (Addendum)
Pharmacy Antibiotic Note  Angela Page is a 48 y.o. female admitted on 12/22/2018 with sepsis.  Pharmacy has been consulted for vancomycin and cefepime dosing.  Pt has osteomyelitis of the left fibula, left greater trochanter, distal sacrum and left hip, along with Proteus UTI.  Vancomycin levels drawn after last dose of vancomycin 1 gm IV Q 24 hrs (dose given at 1900 on 12/29) were as follows: Vancomycin peak level (drawn at 2130 on 12/29): 57 mg/L Vancomycin random level (drawn at time vancomycin trough would be drawn on Q 24 hr regimen, drawn at 18:21 on 12/30): 43 mg/L Of note, pt's Scr has increased from 0.53 yesterday up to 0.94 today; CrCl down to 53.7 ml/min  WBC up to 11.8; afebrile  Weight: 102 lb 8 oz (46.5 kg)  Temp (24hrs), Avg:97.3 F (36.3 C), Min:95 F (35 C), Max:98.3 F (36.8 C)  Recent Labs  Lab 01/15/19 0421 01/16/19 0310 01/17/19 0428 01/18/19 0323 01/18/19 2130 01/19/19 0417 01/19/19 1821  WBC 7.6 6.1 7.2 9.3  --  11.8*  --   CREATININE 0.63 0.62 0.57 0.53  --   --  0.94  VANCOPEAK  --   --   --   --  35*  --   --   VANCORANDOM  --   --   --   --   --   --  43    Estimated Creatinine Clearance: 53.7 mL/min (by C-G formula based on SCr of 0.94 mg/dL).    Allergies  Allergen Reactions  . Penicillins Hives    Has patient had a PCN reaction causing immediate rash, facial/tongue/throat swelling, SOB or lightheadedness with hypotension: Yes Has patient had a PCN reaction causing severe rash involving mucus membranes or skin necrosis: Yes Has patient had a PCN reaction that required hospitalization No Has patient had a PCN reaction occurring within the last 10 years: No-childhood allergy If all of the above answers are "NO", then may proceed with Cephalosporin  Tolerated cephalosporins in past  . Erythromycin Nausea And Vomiting  . Nitrofurantoin Monohyd Macro Nausea And Vomiting  . Oxybutynin Other (See Comments)    Dries mouth out      Antimicrobials this admission: 12/23 Cefepime >>  12/23 Vancomycin >>   Microbiology results: 12/23 BCx X 2: NG/final 12/23 UCx: >100,000 colonies/mL Proteus mirabilis,suscept to cephalosporins 12/23 COVID: positive 12/23 influenza A, influenza B: negative 12/28 MRSA PCR: positive  Plan: Change cefepime to 2 gm IV Q 12 hrs Due to high vancomycin levels and significant increase in Scr (as well as unstable renal function), dose vancomycin by vancomycin levels until renal function stabilizes Do not administer any vancomycin at this time; check vancomycin random level and Scr in 18 hrs (or sooner if Scr has improved significantly with AM labs on 12/31), to determine subsequent dosing strategy Monitor WBC, renal function (daily, and with each random vancomycin level), temp, clinical improvement F/U ID/Palliative care plans re: LOT  Thank you for allowing pharmacy to be a part of this patient's care.  Gillermina Hu, PharmD, BCPS, [facility] Clinical Pharmacist 01/19/2019 7:55 PM

## 2019-01-19 NOTE — Progress Notes (Signed)
Nutrition Follow-up  DOCUMENTATION CODES:   Not applicable  INTERVENTION:   Tube Feeding:  Vital AF 1.2 at 60 ml/hr Provides 108 g of protein, 1728 kcals and 1166 mL of free water Meets 100% estimated calorie and protein needs  Juven BID, each packet provides 80 calories, 8 grams of carbohydrate, 2.5  grams of protein (collagen), 7 grams of L-arginine and 7 grams of L-glutamine; supplement contains CaHMB, Vitamins C, E, B12 and Zinc to promote wound healing    NUTRITION DIAGNOSIS:   Increased nutrient needs related to chronic illness, acute illness(acute on chronic hypoxia in the setting of COVID-19 infection with tracheostomy; osteomyelitis of left fibula;greater trochanter, stage IV decubitis ulcer; UTI) as evidenced by estimated needs.  Being addressed via TF   GOAL:   Patient will meet greater than or equal to 90% of their needs  Met  MONITOR:   TF tolerance, Skin, Labs, Weight trends  REASON FOR ASSESSMENT:   Consult Assessment of nutrition requirement/status, Enteral/tube feeding initiation and management  ASSESSMENT:   48 year old female with past medical history of quadriplegia following spinal cord injury with trach and PEG dependency chronically on 3L, chronic diastolic CHF, neurogenic orthostatic hypotension, COPD who presented from facility with concerns of lethargy. In ED, CT abdomen and pelvis showed suprapubic catheter appearing to protrude through posterior bladder wall with fluid in the pelvis; left tibia/fibula x-ray showed osseous demineralization concerning for osteomyelitis with wound care and conservative management and found to be COVID-19 positive.  RD working remotely.  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  Estimated Nutritional Needs:   Kcal:  1600-1800  Protein:  90-113 (2-2.5 g/kg)  Fluid:  >/= 1.6 L/day  Kerman Passey MS, RDN, LDN, CNSC [phone] Pager  [phone] Weekend/On-Call Pager

## 2019-01-19 NOTE — Progress Notes (Signed)
Bilateral upper and lower extremity venous duplex has been completed. Preliminary results can be found in CV Proc through chart review.  Results were given to the patient's nurse, [facility].  01/19/19 1:49 PM Angela Page RVT

## 2019-01-19 NOTE — Progress Notes (Signed)
[facility] Progress Note Patient Name: Attalie Sanguinetti DOB: 1970/10/26 MRN: [id number]   Date of Service  01/19/2019  HPI/Events of Note  Peak Vancomycin level 50  eICU Interventions  I confirmed with clinical pharmacist that they are actively managing and monitoring Vanc levels.        Kerry Kass Janee Ureste 01/19/2019, 12:03 AM

## 2019-01-19 NOTE — Progress Notes (Addendum)
eLink Physician-Brief Progress Note Patient Name: Angela Page DOB: [birth year] MRN: [id number]   Date of Service  01/19/2019  HPI/Events of Note  Pt with recently placed supra-pubic cystostomy, bladder scan demonstrated 380 ml of urine raising question of urinary retention secondary to malfunctioning cystostomy, urology reportedly on the way to re-evaluate Pt. Saturation unchanged and respiratory status not materially different from baseline.  eICU Interventions  Will await urology evaluation. No Elink intervention.        Kerry Kass Dannah Ryles 01/19/2019, 7:45 PM

## 2019-01-19 NOTE — Plan of Care (Signed)

## 2019-01-19 NOTE — Progress Notes (Signed)
Patient ID: Angela Page, female   DOB: 02/02/1970, 48 y.o.   MRN: [id number]  I was called about patient regarding concerns that suprapubic tube was not draining well.  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  She was seen on 12/24 by Dr. Lovena Neighbours due to concerns that SP tube was not draining well.  This was changed and a new 18 Fr catheter placed with return of large volume with concern for infection.  Urine culture was positive for Proteus and she is on cefepime and vancomycin.  There was also concern about possible bladder perforation but CT cystogram confirmed no extravasation and catheter in correct position following change.  Today, SP tube noted to be draining but with decreased output.  Increased leakage noted around the catheter.  I discussed situation with nursing staff and offered reassurance after catheter was flushed and flushed easily.  Concern persisted throughout the day however after a bladder scan was performed with > 300 cc.  I was called and came an evaluated the patient.  Her catheter was flushed and manipulated.  It irrigated and was confirmed to be in proper position.  The bladder scan is likely not very accurate in this situation.  If further concerns about adequate bladder drainage, I would suggest flushing/irrigating catheter and if concerns for urine not draining, perform in and out urethral catheterization to see if there is any residual.

## 2019-01-19 NOTE — Consult Note (Signed)
NAME:  Angela Page, MRN:  [id number], DOB:  1970-08-07, LOS: 7 ADMISSION DATE:  01/10/2019, CONSULTATION DATE:  01/18/19 REFERRING MD:  Neysa Bonito, CHIEF COMPLAINT:  Dyspnea   Brief History   48 year old quadriplegic presenting with osteomyelitis related to multiple pressure injuries.  Incidentally found to be Covid positive.  History of present illness   48 year old quadriplegic presenting with osteomyelitis related to multiple pressure injuries.  Incidentally found to be Covid positive.  She is chronically on 3 L of oxygen through her tracheostomy.  She is not vent dependent.  Has a bunch of issues: (1) Anemia, unexplained, AC stopped until yesterday, transfused 1 unit (2) Osteomyelitis of hips/sacrum- on empiric abx, ortho does not want to bone biopsy, duration and narrowing TBD (3) Worsening hypoxemia- related to covid and possibly aspiration (4) Malpositioned suprapubic catheter- replaced by urology, no evidence of leak on CT (5) UTI vs. colonization (6) Anxiety/insomnia requiring intermittent benzodiazepines (7) Elevated Alk phos related to bony involvement/osteo (8) acute on chronic hypotension  PCCM consulted as patient deteriorated further from respiratory standpoint requiring suctioning to bring back up saturations.   Past Medical History   Past Medical History:  Diagnosis Date  . Acute on chronic respiratory failure with hypoxia ([place])   . Anasarca 06/10/2016  . Asthma   . At high risk for severe sepsis   . Cocaine use 10/08/2013  . Decubitus ulcer of buttock, unstageable ([place])   . Depression   . Diabetes mellitus without complication ([place])   . GERD (gastroesophageal reflux disease)   . HCAP (healthcare-associated pneumonia) 06/09/2016  . Hepatitis    hx of hepatits frm mono   . Kidney stone   . Lobar pneumonia ([place])   . Overdose of opiate or related narcotic ([place]) 09/02/2014  . Pacemaker   . Pleurisy   . Polysubstance dependence including opioid type drug,  episodic abuse ([place]) 10/27/2013  . Protein calorie malnutrition ([place]) 10/31/2015  . Quadriparesis ([place])   . Quadriplegia and quadriparesis ([place])   . Quadriplegia, C5-C7 incomplete ([place]) 10/08/2013  . Stage IV pressure ulcer of sacral region ([place]) 10/31/2015  . Tracheostomy status ([place])     has a past surgical history that includes Appendectomy; Cardiac surgery; I & D extremity (10/28/2011); ir generic historical (11/23/2015); Back surgery (10/08/2013); Tracheostomy; Gastrostomy (11/23/2015); Insertion of suprapubic catheter (N/A, 07/28/2016); Cystoscopy w/ ureteral stent placement (Right, 03/09/2017); Cystoscopy/ureteroscopy/holmium laser/stent placement (Right, 07/16/2017); Cystoscopy w/ ureteral stent removal (Right, 07/16/2017); Lithotripsy (08/2017); IR PATIENT EVAL TECH 0-60 MINS (12/18/2017); and IR REPLACE G-TUBE SIMPLE WO FLUORO (12/19/2017).   Significant Hospital Events   12/30/2018 -admit 12/28 - CTA - No PE 12/29 - ccm consul;t, limited code and icu transfer. . On iv heparin gtt for suspected PE  Consults:  ID, urology, wound care  Procedures:  12/24 SPC replaced  Significant Diagnostic Tests:  CXR 12/29 IMPRESSION: Small left pleural effusion and basilar airspace disease have worsened since the most recent plain film. Small right pleural effusion and basilar airspace disease appear unchanged.  CT Chest 12/28 IMPRESSION: 1. No evidence of a pulmonary embolism. 2. Suspect pulmonary edema, likely on the basis of congestive heart failure, reflected by interstitial thickening, small bilateral effusions and patchy areas of ground-glass opacity. Infection should be considered if there are consistent clinical findings. 3. There is additional dependent opacity in the lower lobes consistent with atelectasis. 4. Two vessel coronary artery calcifications. Minor aortic atherosclerosis.  Micro Data:  12/23 - urine - proteus 12/23 - covid + 122/28 -  MRSA PCR  positive     Antimicrobials:  Vancomycin 12/23 >>  12/30 Cefepime 12/23 (proteus) uti >> Remdesivir 12/23 - 12/27 Doxycycline 12/27 >> 12/29      Interim history/subjective:    01/19/2019 - mag replaced. On IV heparin gtt. On o2 via trach at 4L [place]. On bair hugger. Bedsidde RN reports that 40 min aftrer transfer here patient has been fine and not needed vent since yesterday. Was on levophed few houirs yesterday but improved after midodrine yesterday. Currently sb p 92/map 65. WAs On scheduled albumin Q6h since yesterday  Objective   Blood pressure 95/71, pulse 81, temperature (!) 97.4 F (36.3 C), temperature source Oral, resp. rate 17, weight 46.5 kg, last menstrual period 05/27/2016, SpO2 99 %.    FiO2 (%):  [28 %-55 %] 45 %   Intake/Output Summary (Last 24 hours) at 01/19/2019 0836 Last data filed at 01/19/2019 0600 Gross per 24 hour  Intake 2875.89 ml  Output 825 ml  Net 2050.89 ml   Filed Weights   12/24/2018 1704  Weight: 46.5 kg     General Appearance:  Looks decondtioned, frail, cachectic Head:  Normocephalic, without obvious abnormality, atraumatic Eyes:  PERRL - yes, conjunctiva/corneas - muddy     Ears:  Normal external ear canals, both ears Nose:  G tube - n Throat:  ETT TUBE - no , OG tube - no. TRACH + Neck:  Supple,  No enlargement/tenderness/nodules Lungs: Clear to auscultation bilaterally, no distress Heart:  S1 and S2 normal, no murmur, CVP - no.  Pressors - no Abdomen:  Soft, no masses, no organomegaly. Suprapubic cath +, PEG + Genitalia / Rectal:  Not done Extremities:  Extremities- contracted  Skin:  ntact in exposed areas . Sacral area - not examined but reported breakdown + Neurologic:  Sedation - none -> RASS - +1 . Moves all 4s - no.  QUAD +. CAM-ICU - cannot test . Orientation - not tested       Resolved Hospital Problem list   N/A  Assessment & Plan:  # Acute on chronic hypoxemic respiratory failure in setting of multiple  comorbidities and encephalopathy.  Differential here is worsening Covid infection, aspiration, inability to handle secretions.  I doubt she developed a blood clot between yesterday and today.  She has been started on heparin drip 12./29 (PE was ruled out 1 d earlier on 12/28)   - 01/19/2019 - on O2 via trach. Likely event was mucus plug  Plan  = contine o2 support via trach - dc iv heparin (this is not PE clinically) but get duplex LE   # COVID- infiltrates on CT either viral or aspiration or low oncotic pressure edema - Finished course of dexamethasone and remdesivir  Plan  - monitor  # Chronic vasoplegia- midodrine, stress steroids,   12/30 - finishing up albumin. Off pressors  P;lan Monitor Goal sbo > 85, MAP > 55 continie midodrine Might benefit from flornief - triad MD to decide  # Acute on chronic anemia- unexplained but likely related to phlebotomy, poor nutrition, critical illness bone marrow dysfunction,  stable after 1 unit  01/19/2019 - no change  Plan  - - PRBC for hgb </= 6.9gm%    - exceptions are   -  if ACS susepcted/confirmed then transfuse for hgb </= 8.0gm%,  or    -  active bleeding with hemodynamic instability, then transfuse regardless of hemoglobin value   At at all times try to transfuse 1 unit prbc as  possible with exception of active hemorrhage     # Acute on chronic metabolic encephalopathy, myriad of reasons for this.  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  Bone biopsy not possible at this time due to Covid status and fraility. 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  Plan  - gabapentin for chronic pain baeline med - wound care for sacral decub  # Elevated ammonia- probably related to muscle breakdown  Plan  - on lactulose but  Will dc due to diarrhea  # Elevated GGT, abnormal CT appearance of gallbladder - RUQ [name] pending  # PEG in place- sTF, monitor for refeeding  #Electorylte imbalance  - mag repleted  Plan  - monitor r  # Multiorgan failure approaching end of life- palliative following, NO CPR But full medical care +     Best practice:  Diet: TF Pain/Anxiety/Delirium protocol (if indicated): Hold given encephalopathy VAP protocol (if indicated): Not needed right now DVT prophylaxis: heparin gtt -> change to heparin sq GI prophylaxis: protonix Glucose control: SSI Mobility: BR Code Status: partial code Family Communication:mom updated 01/19/2019  Disposition: move from 27mto 5W on TRiad      ATTESTATION & SIGNATURE     Dr. MBrand Males M.D., FWest Coast Endoscopy CenterC.P Pulmonary and Critical Care Medicine Staff Physician CDeweyvillePulmonary and Critical Care Pager: [phone] If no answer or between  15:00h - 7:00h: call [phone]  01/19/2019 8:37 AM     LABS    PULMONARY Recent Labs  Lab 01/18/19 2155  PHART 7.236*  PCO2ART 65.0*  PO2ART 73.0*  HCO3 27.6  TCO2 30  O2SAT 91.0    CBC Recent Labs  Lab 01/17/19 0428 01/18/19 0323 01/18/19 1532 01/18/19 2155 01/19/19 0417  HGB 8.3* 8.6* 8.5* 9.2* 8.3*  HCT 28.5* 29.0* 28.9* 27.0* 28.6*  WBC 7.2 9.3  --   --  11.8*  PLT 466* 448*  --   --  583*    COAGULATION No results for input(s): INR in the last 168 hours.  CARDIAC  No results for input(s): TROPONINI in the last 168 hours. No results for input(s): PROBNP in the last 168 hours.   CHEMISTRY Recent Labs  Lab 01/13/19 1900 01/14/19 0304  01/15/19 0421 01/16/19 0310 01/17/19 0428 01/18/19 0323 01/18/19 1322 01/18/19 1844 01/18/19 2155 01/19/19 0417  NA 134* 135 132* 135 134* 135  --   --  148*  --   K 4.3 4.0 3.6 3.3* 3.6 3.2*  --   --  4.6  --   CL 108 106 108 111 109 111  --   --   --   --   CO2 16* 17* 17* 18* 18* 17*  --   --   --   --   GLUCOSE 58* 86 213* 153* 129* 142*  --   --   --   --   BUN _0 26* 51*  --   --   --   --  CREATININE 0.75 0.74 0.63 0.62 0.57 0.53  --   --   --   --   CALCIUM 8.3* 8.4* 8.0* 8.1* 8.4* 8.7*  --   --   --   --   MG 1.7  --   --   --   --   --  1.5* 1.4*  --  1.6*  PHOS  --   --   --   --   --   --  3.0 2.5  --  2.8   Estimated Creatinine Clearance: 63.1 mL/min (by C-G formula based on SCr of 0.53 mg/dL).   LIVER Recent Labs  Lab 01/14/19 0304 01/15/19 0421 01/16/19 0310 01/17/19 0428 01/18/19 0323  AST 32 27 36 46* 39  ALT _0 32 28  ALKPHOS 1,118* 1,002* 957* 957* 825*  BILITOT 1.5* 1.2 1.0 1.1 1.3*  PROT 6.0* 5.0* 5.9* 6.2* 6.2*  ALBUMIN 1.5* 1.3* 1.4* 1.4* 1.8*     INFECTIOUS Recent Labs  Lab 12/25/2018 1730  LATICACIDVEN 0.8     ENDOCRINE CBG (last 3)  Recent Labs    01/18/19 2331 01/19/19 0423 01/19/19 0822  GLUCAP 159* 92 72         IMAGING x48h  - image(s) personally visualized  -   highlighted in bold CT ANGIO CHEST PE W OR WO CONTRAST  Result Date: 01/17/2019 CLINICAL DATA:  Shortness of breath. Altered mental status. Difficulty with following breathing instructions, which somewhat limits this study. EXAM: CT ANGIOGRAPHY CHEST WITH CONTRAST TECHNIQUE: Multidetector CT imaging of the chest was performed using the standard protocol during bolus administration of intravenous contrast. Multiplanar CT image reconstructions and MIPs were obtained to evaluate the vascular anatomy. CONTRAST:  18m OMNIPAQUE IOHEXOL 350 MG/ML SOLN COMPARISON:  Chest radiograph, 01/16/2019.  CTA chest, 06/09/2016. FINDINGS: Cardiovascular: There is  satisfactory opacification of the pulmonary arteries to the segmental level. There is no evidence of a pulmonary embolism. Heart is top-normal in size. No pericardial effusion. Two vessel coronary artery calcifications. Aorta is normal in caliber. No dissection. Minor aortic atherosclerosis. Mediastinum/Nodes: Well-positioned tracheostomy tube. No neck base or axillary masses or enlarged lymph nodes. 1.2 cm short axis right paratracheal lymph node, image 35, series 5. Poorly defined additional prominent lymph nodes noted along the paratracheal region and in the pre-vascular space. No mediastinal masses. No hilar masses or enlarged lymph nodes. Trachea is patent. Esophagus is unremarkable. Right PICC tip in the right atrium.  AICD leads well positioned. Lungs/Pleura: Small bilateral pleural effusions. Bilateral interstitial thickening. Patchy areas of ground-glass opacity are noted bilaterally. There is dependent opacity in the lower lobes that is consistent with atelectasis. No pneumothorax. Upper Abdomen: No acute abnormality. Musculoskeletal: Diffuse subcutaneous soft tissue edema. No fracture or bone lesion.  No chest wall masses. Review of the MIP images confirms the above findings. IMPRESSION: 1. No evidence of a pulmonary embolism. 2. Suspect pulmonary edema, likely on the basis of congestive heart failure, reflected by interstitial thickening, small bilateral effusions and patchy areas of ground-glass opacity. Infection should be considered if there are consistent clinical findings. 3. There is additional dependent opacity in the lower lobes consistent with atelectasis. 4. Two vessel coronary artery calcifications. Minor aortic atherosclerosis. Aortic Atherosclerosis (ICD10-I70.0). Electronically Signed   By: DLajean ManesM.D.   On: 01/17/2019 17:10   DG CHEST PORT 1 VIEW  Result Date: 01/18/2019 CLINICAL DATA:  Shortness of breath today. EXAM: PORTABLE CHEST 1 VIEW COMPARISON:  Single-view of the chest  01/16/2019 and 08/19/2018. CT chest 01/17/2019. FINDINGS: Tracheostomy tube, right PICC and pacing device are unchanged. The patient has small pleural effusions and basilar airspace disease, worse on the left where the effusion and airspace disease have progressed since the most recent plain film. No pneumothorax. There is mild cardiomegaly. No acute or focal bony abnormality. IMPRESSION: Small left pleural effusion and basilar airspace disease have worsened since the most recent plain film. Small right pleural effusion and basilar airspace disease appear unchanged. Electronically Signed   By: Inge Rise M.D.   On: 01/18/2019 11:01

## 2019-01-19 NOTE — Progress Notes (Signed)
[facility] Progress Note Patient Name: Angela Page DOB: 07-21-1970 MRN: [id number]   Date of Service  01/19/2019  HPI/Events of Note  Magnesium 1.6  eICU Interventions  Magnesium sulfate 2 gm iv x 1        Islam Villescas U Quinisha Mould 01/19/2019, 6:38 AM

## 2019-01-19 NOTE — Progress Notes (Signed)
Assisted tele visit to patient with mother.  Shareen Capwell Anderson, RN   

## 2019-01-20 ENCOUNTER — Inpatient Hospital Stay (HOSPITAL_COMMUNITY): Payer: Medicare Other

## 2019-01-20 DIAGNOSIS — E872 Acidosis: Secondary | ICD-10-CM

## 2019-01-20 DIAGNOSIS — J9620 Acute and chronic respiratory failure, unspecified whether with hypoxia or hypercapnia: Secondary | ICD-10-CM

## 2019-01-20 LAB — COMPREHENSIVE METABOLIC PANEL
ALT: 28 U/L (ref 0–44)
AST: 42 U/L — ABNORMAL HIGH (ref 15–41)
Albumin: 2.6 g/dL — ABNORMAL LOW (ref 3.5–5.0)
Alkaline Phosphatase: 632 U/L — ABNORMAL HIGH (ref 38–126)
Anion gap: 9 (ref 5–15)
BUN: 103 mg/dL — ABNORMAL HIGH (ref 6–20)
CO2: 16 mmol/L — ABNORMAL LOW (ref 22–32)
Calcium: 7.9 mg/dL — ABNORMAL LOW (ref 8.9–10.3)
Chloride: 115 mmol/L — ABNORMAL HIGH (ref 98–111)
Creatinine, Ser: 1.04 mg/dL — ABNORMAL HIGH (ref 0.44–1.00)
GFR calc Af Amer: >60 mL/min (ref >60)
GFR calc non Af Amer: >60 mL/min (ref >60)
Glucose, Bld: 120 mg/dL — ABNORMAL HIGH (ref 70–99)
Potassium: 5 mmol/L (ref 3.5–5.1)
Sodium: 140 mmol/L (ref 135–145)
Total Bilirubin: 1.2 mg/dL (ref 0.3–1.2)
Total Protein: 6.3 g/dL — ABNORMAL LOW (ref 6.5–8.1)

## 2019-01-20 LAB — GLUCOSE, CAPILLARY
Glucose-Capillary: 102 mg/dL — ABNORMAL HIGH (ref 70–99)
Glucose-Capillary: 139 mg/dL — ABNORMAL HIGH (ref 70–99)
Glucose-Capillary: 142 mg/dL — ABNORMAL HIGH (ref 70–99)
Glucose-Capillary: 144 mg/dL — ABNORMAL HIGH (ref 70–99)
Glucose-Capillary: 154 mg/dL — ABNORMAL HIGH (ref 70–99)
Glucose-Capillary: 209 mg/dL — ABNORMAL HIGH (ref 70–99)
Glucose-Capillary: 229 mg/dL — ABNORMAL HIGH (ref 70–99)

## 2019-01-20 LAB — POCT I-STAT 7, (LYTES, BLD GAS, ICA,H+H)
Acid-base deficit: 14 mmol/L — ABNORMAL HIGH (ref 0.0–2.0)
Acid-base deficit: 10 mmol/L — ABNORMAL HIGH (ref 0.0–2.0)
Bicarbonate: 15.6 mmol/L — ABNORMAL LOW (ref 20.0–28.0)
Bicarbonate: 17 mmol/L — ABNORMAL LOW (ref 20.0–28.0)
Calcium, Ion: 1.5 mmol/L — ABNORMAL HIGH (ref 1.15–1.40)
Calcium, Ion: 1.43 mmol/L — ABNORMAL HIGH (ref 1.15–1.40)
HCT: 32 % — ABNORMAL LOW (ref 36.0–46.0)
HCT: 31 % — ABNORMAL LOW (ref 36.0–46.0)
Hemoglobin: 10.9 g/dL — ABNORMAL LOW (ref 12.0–15.0)
Hemoglobin: 10.5 g/dL — ABNORMAL LOW (ref 12.0–15.0)
O2 Saturation: 84 %
O2 Saturation: 96 %
Patient temperature: 96.5
Potassium: 3.8 mmol/L (ref 3.5–5.1)
Potassium: 3.3 mmol/L — ABNORMAL LOW (ref 3.5–5.1)
Sodium: 143 mmol/L (ref 135–145)
Sodium: 143 mmol/L (ref 135–145)
TCO2: 17 mmol/L — ABNORMAL LOW (ref 22–32)
TCO2: 18 mmol/L — ABNORMAL LOW (ref 22–32)
pCO2 arterial: 52.4 mmHg — ABNORMAL HIGH (ref 32.0–48.0)
pCO2 arterial: 41 mmHg (ref 32.0–48.0)
pH, Arterial: 7.083 — CL (ref 7.350–7.450)
pH, Arterial: 7.219 — ABNORMAL LOW (ref 7.350–7.450)
pO2, Arterial: 67 mmHg — ABNORMAL LOW (ref 83.0–108.0)
pO2, Arterial: 92 mmHg (ref 83.0–108.0)

## 2019-01-20 LAB — CBC
HCT: 31 % — ABNORMAL LOW (ref 36.0–46.0)
Hemoglobin: 9 g/dL — ABNORMAL LOW (ref 12.0–15.0)
MCH: 25.9 pg — ABNORMAL LOW (ref 26.0–34.0)
MCHC: 29 g/dL — ABNORMAL LOW (ref 30.0–36.0)
MCV: 89.3 fL (ref 80.0–100.0)
Platelets: 741 10*3/uL — ABNORMAL HIGH (ref 150–400)
RBC: 3.47 MIL/uL — ABNORMAL LOW (ref 3.87–5.11)
RDW: 24.9 % — ABNORMAL HIGH (ref 11.5–15.5)
WBC: 20.5 10*3/uL — ABNORMAL HIGH (ref 4.0–10.5)
nRBC: 0.5 % — ABNORMAL HIGH (ref 0.0–0.2)

## 2019-01-20 LAB — PROCALCITONIN: Procalcitonin: 0.42 ng/mL

## 2019-01-20 LAB — LACTIC ACID, PLASMA: Lactic Acid, Venous: 0.8 mmol/L (ref 0.5–1.9)

## 2019-01-20 LAB — CREATININE, SERUM
Creatinine, Ser: 1.12 mg/dL — ABNORMAL HIGH (ref 0.44–1.00)
GFR calc Af Amer: >60 mL/min (ref >60)
GFR calc non Af Amer: 58 mL/min — ABNORMAL LOW (ref >60)

## 2019-01-20 MED ORDER — HYDROCORTISONE NA SUCCINATE PF 100 MG IJ SOLR
50.0000 mg | Freq: Three times a day (TID) | INTRAMUSCULAR | Status: DC
  Administered 2019-01-21 – 2019-01-22 (×4): 50 mg via INTRAVENOUS
  Filled 2019-01-20 (×5): qty 2

## 2019-01-20 MED ORDER — SODIUM BICARBONATE-DEXTROSE 150-5 MEQ/L-% IV SOLN
150.0000 meq | INTRAVENOUS | Status: DC
  Administered 2019-01-20 – 2019-01-22 (×5): 150 meq via INTRAVENOUS
  Filled 2019-01-20 (×5): qty 1000

## 2019-01-20 MED ORDER — LACTULOSE 10 GM/15ML PO SOLN
20.0000 g | Freq: Two times a day (BID) | ORAL | Status: DC
  Administered 2019-01-20 – 2019-01-21 (×3): 20 g
  Filled 2019-01-20 (×3): qty 30

## 2019-01-20 MED ORDER — SODIUM CHLORIDE 0.9 % IV SOLN: INTRAVENOUS | Status: DC

## 2019-01-20 MED ORDER — LACTATED RINGERS IV BOLUS
500.0000 mL | Freq: Once | INTRAVENOUS | Status: AC
  Administered 2019-01-20: 13:00:00 500 mL via INTRAVENOUS

## 2019-01-20 MED ORDER — LINEZOLID 600 MG/300ML IV SOLN
600.0000 mg | Freq: Two times a day (BID) | INTRAVENOUS | Status: DC
  Administered 2019-01-20 – 2019-01-25 (×10): 600 mg via INTRAVENOUS
  Filled 2019-01-20 (×11): qty 300

## 2019-01-20 MED ORDER — CALCIUM GLUCONATE-NACL 1-0.675 GM/50ML-% IV SOLN
1.0000 g | Freq: Once | INTRAVENOUS | Status: AC
  Administered 2019-01-20: 1000 mg via INTRAVENOUS
  Filled 2019-01-20: qty 50

## 2019-01-20 MED ORDER — PHENYLEPHRINE HCL-NACL 10-0.9 MG/250ML-% IV SOLN
0.0000 ug/min | INTRAVENOUS | Status: DC
  Administered 2019-01-20 – 2019-01-21 (×4): 50 ug/min via INTRAVENOUS
  Administered 2019-01-21: 09:00:00 40 ug/min via INTRAVENOUS
  Filled 2019-01-20 (×5): qty 250

## 2019-01-20 MED ORDER — SODIUM CHLORIDE 0.9 % IV BOLUS
1000.0000 mL | Freq: Once | INTRAVENOUS | Status: AC
  Administered 2019-01-20: 1000 mL via INTRAVENOUS

## 2019-01-20 NOTE — Consult Note (Signed)
[facility]  HISTORY AND PHYSICAL  Angela Page is an 48 y.o. female.    Chief Complaint: lethargy  HPI: Pt is a 30F with a PMH sig for spinal cord injury with quadriplegia, s/p trach and peg, infected sacral decubs/ osteomyelitis of multiple sites, cellulitis of the back who is now seen in consultation at the request of Dr. Tyrell Antonio for eval and recs re: AKI and combined respiratory and metabolic acidosis.  Pt is unable to provide history.  She was sent from facility 12/23 over concerns for lethargy and possible sepsis.  Was found to have sats in the 64s and placed on NRB.  CT abd/ pelvis showing possible protrusion of suprapubic catheter.  She was found to be COVID +.  Suprapubic cath replaced.  Her baseline Cr appears to be 0.4-0.5.  Appeared to be improving initially but acutely decompensated 12/28-12/29.  Had a CTA with contrast.  Has had vanc too with toxic levels in the 40s-50s.  Cr is now 1.04 with BUN of 103.  CO2 16.  ABG with pH of 7.0 and some hypercarbia as well.  PCCM placing pt back on vent.  In this setting we are asked to see.      PMH: Past Medical History:  Diagnosis Date  . Acute on chronic respiratory failure with hypoxia ([place])   . Anasarca 06/10/2016  . Asthma   . At high risk for severe sepsis   . Cocaine use 10/08/2013  . Decubitus ulcer of buttock, unstageable ([place])   . Depression   . Diabetes mellitus without complication ([place])   . GERD (gastroesophageal reflux disease)   . HCAP (healthcare-associated pneumonia) 06/09/2016  . Hepatitis    hx of hepatits frm mono   . Kidney stone   . Lobar pneumonia ([place])   . Overdose of opiate or related narcotic ([place]) 09/02/2014  . Pacemaker   . Pleurisy   . Polysubstance dependence including opioid type drug, episodic abuse ([place]) 10/27/2013  . Protein calorie malnutrition ([place]) 10/31/2015  . Quadriparesis ([place])   . Quadriplegia and quadriparesis ([place])   . Quadriplegia, C5-C7 incomplete ([place]) 10/08/2013   . Stage IV pressure ulcer of sacral region ([place]) 10/31/2015  . Tracheostomy status (HCC)    PSH: Past Surgical History:  Procedure Laterality Date  . APPENDECTOMY    . BACK SURGERY  10/08/2013   fusion of spine, cervical region  . CARDIAC SURGERY    . CYSTOSCOPY W/ URETERAL STENT PLACEMENT Right 03/09/2017   Procedure: CYSTOSCOPY [name] STENT PLACEMENT;  Surgeon: Irine Seal, MD;  Location: [facility];  Service: Urology;  Laterality: Right;  . CYSTOSCOPY W/ URETERAL STENT REMOVAL Right 07/16/2017   Procedure: CYSTOSCOPY WITH STENT REMOVAL;  Surgeon: Franchot Gallo, MD;  Location: [facility];  Service: Urology;  Laterality: Right;  . CYSTOSCOPY/URETEROSCOPY/HOLMIUM LASER/STENT PLACEMENT Right 07/16/2017   Procedure: CYSTOSCOPY/URETEROSCOPY/HOLMIUM LASER/STENT PLACEMENT/ALSO STONE EXTRACTION;  Surgeon: Franchot Gallo, MD;  Location: [facility];  Service: Urology;  Laterality: Right;  . GASTROSTOMY  11/23/2015  . I & D EXTREMITY  10/28/2011   Procedure: IRRIGATION AND DEBRIDEMENT EXTREMITY;  Surgeon: Tennis Must, MD;  Location: [facility];  Service: Orthopedics;  Laterality: Right;  Irrigation and debridement right middle finger  . INSERTION OF SUPRAPUBIC CATHETER N/A 07/28/2016   Procedure: INSERTION OF SUPRAPUBIC CATHETER;  Surgeon: Franchot Gallo, MD;  Location: [facility];  Service: Urology;  Laterality: N/A;  . IR GENERIC HISTORICAL  11/23/2015   IR GASTROSTOMY TUBE MOD SED 11/23/2015 [facility]  RAD  . IR PATIENT EVAL TECH 0-60 MINS  12/18/2017  . IR REPLACE G-TUBE SIMPLE WO FLUORO  12/19/2017  . LITHOTRIPSY  08/2017  . TRACHEOSTOMY      Past Medical History:  Diagnosis Date  . Acute on chronic respiratory failure with hypoxia ([place])   . Anasarca 06/10/2016  . Asthma   . At high risk for severe sepsis   . Cocaine use 10/08/2013  . Decubitus ulcer of buttock, unstageable ([place])   . Depression   . Diabetes mellitus without complication ([place])   . GERD (gastroesophageal  reflux disease)   . HCAP (healthcare-associated pneumonia) 06/09/2016  . Hepatitis    hx of hepatits frm mono   . Kidney stone   . Lobar pneumonia ([place])   . Overdose of opiate or related narcotic ([place]) 09/02/2014  . Pacemaker   . Pleurisy   . Polysubstance dependence including opioid type drug, episodic abuse ([place]) 10/27/2013  . Protein calorie malnutrition ([place]) 10/31/2015  . Quadriparesis ([place])   . Quadriplegia and quadriparesis ([place])   . Quadriplegia, C5-C7 incomplete ([place]) 10/08/2013  . Stage IV pressure ulcer of sacral region ([place]) 10/31/2015  . Tracheostomy status (HCC)     Medications:   Scheduled: . chlorhexidine  15 mL Mouth Rinse BID  . Chlorhexidine Gluconate Cloth  6 each Topical Daily  . gabapentin  200 mg Per Tube Q8H  . guaiFENesin  15 mL Per Tube Q6H  . heparin injection (subcutaneous)  5,000 Units Subcutaneous Q8H  . hydrocortisone sod succinate (SOLU-CORTEF) inj  50 mg Intravenous Q8H  . insulin aspart  0-24 Units Subcutaneous Q4H  . lactulose  20 g Per Tube BID  . mouth rinse  15 mL Mouth Rinse q12n4p  . midodrine  10 mg Per Tube TID WC  . mirabegron ER  50 mg Oral Daily  . multivitamin  15 mL Per Tube Daily  . mupirocin ointment  1 application Nasal BID  . nutrition supplement (JUVEN)  1 packet Per Tube BID BM  . pantoprazole sodium  40 mg Per Tube Daily  . ramelteon  8 mg Oral QHS  . sodium chloride flush  10-40 mL Intracatheter Q12H    Medications Prior to Admission  Medication Sig Dispense Refill  . acetaminophen (TYLENOL) 500 MG tablet Take 1 tablet (500 mg total) by mouth every 8 (eight) hours as needed for mild pain. 2 tablet 0  . Amino Acids-Protein Hydrolys (FEEDING SUPPLEMENT, PRO-STAT SUGAR FREE 64,) LIQD Place 30 mLs into feeding tube 3 (three) times daily. (Patient taking differently: Take 30 mLs by mouth 3 (three) times daily. ) 887 mL 0  . aspirin 325 MG tablet Take 325 mg by mouth daily.    . baclofen (LIORESAL) 5 mg TABS tablet Take 5 mg  by mouth 3 (three) times daily.    Marland Kitchen buPROPion (WELLBUTRIN) 75 MG tablet Take 75 mg by mouth 2 (two) times daily. Via peg tube    . docusate sodium (COLACE) 100 MG capsule Take 100 mg by mouth 2 (two) times daily.    . Dulaglutide (TRULICITY) 1.5 0000000 SOPN Inject 1.5 mg into the skin every Friday.    . DULoxetine (CYMBALTA) 60 MG capsule Take 60 mg by mouth daily.    . ferrous sulfate 325 (65 FE) MG tablet Take 325 mg by mouth daily with breakfast.    . gabapentin (NEURONTIN) 300 MG capsule Take 300 mg by mouth 3 (three) times daily.    . insulin aspart (NOVOLOG) 100  UNIT/ML injection Inject 10 Units into the skin every 4 (four) hours. For glucose 150 to 200 use 1 units, for glucose 201-250 use 2 units, for 251 to 300 use 4 units, for 301 to 350 use 6 units, for 351 to 400 use 8 units, for 401 or greater use 10 units. (Patient taking differently: Inject 0-8 Units into the skin See admin instructions. Check FSBS every 4 hours. Give insulin according to sliding scale:  0-70 call MD 71-149 0 units 150-200 1 unit 201-250 2 units 251-300 4 units 301-350 6 units 351-400 8 units >400 call MD) 10 mL 0  . Insulin Glargine (BASAGLAR KWIKPEN) 100 UNIT/ML SOPN Inject 0.05 mLs (5 Units total) into the skin at bedtime. 1 pen 0  . ipratropium-albuterol (DUONEB) 0.5-2.5 (3) MG/3ML SOLN Inhale 3 mLs into the lungs 2 (two) times daily. for Wheezing    . lactulose (CHRONULAC) 10 GM/15ML solution Take 20 g by mouth 2 (two) times daily.     Marland Kitchen loratadine (CLARITIN) 10 MG tablet Take 10 mg by mouth daily.     . midodrine (PROAMATINE) 10 MG tablet Take 10 mg by mouth every 8 (eight) hours as needed (hypotension). SBP 100 or less    . Multiple Vitamin (MULTIVITAMIN WITH MINERALS) TABS tablet Take by mouth daily.     . nutrition supplement, JUVEN, (JUVEN) PACK Place 1 packet into feeding tube 2 (two) times daily between meals. For wound healing    . omeprazole (PRILOSEC) 20 MG capsule Take 20 mg by mouth 2 (two)  times daily.    . ondansetron (ZOFRAN) 4 MG tablet Take 4 mg by mouth every 8 (eight) hours as needed for nausea or vomiting.     Marland Kitchen oxycodone (OXY-IR) 5 MG capsule Take 1 capsule (5 mg total) by mouth every 8 (eight) hours as needed for pain. 2 capsule 0  . OXYGEN Inhale 3 L into the lungs continuous.    . polyethylene glycol (MIRALAX / GLYCOLAX) packet Take 17 g by mouth daily. Hold if diarrhea. (Patient taking differently: Take 17 g by mouth 2 (two) times daily. Hold if diarrhea.) 14 each 0  . potassium chloride 20 MEQ/15ML (10%) SOLN Take 40 mEq by mouth 2 (two) times daily.    . simethicone (MYLICON) 80 MG chewable tablet Chew 80 mg by mouth every 6 (six) hours as needed for flatulence. Via peg tube    . tiotropium (SPIRIVA) 18 MCG inhalation capsule Place 18 mcg into inhaler and inhale daily.    Marland Kitchen zolpidem (AMBIEN) 5 MG tablet Take 0.5 tablets (2.5 mg total) by mouth at bedtime. 1 tablet 0  . bisacodyl (DULCOLAX) 10 MG suppository Place 1 suppository (10 mg total) rectally daily as needed for mild constipation. (Patient not taking: Reported on 01/13/2019) 12 suppository 0  . docusate (COLACE) 50 MG/5ML liquid Place 10 mLs (100 mg total) into feeding tube daily. (Patient not taking: Reported on 10/21/2018) 100 mL 0  . DULoxetine (CYMBALTA) 30 MG capsule Take 1 capsule (30 mg total) by mouth 2 (two) times daily. (Patient not taking: Reported on 10/21/2018) 30 capsule 3  . ferrous sulfate 300 (60 Fe) MG/5ML syrup Place 3.7 mLs (220 mg total) into feeding tube daily with breakfast. (Patient not taking: Reported on 08/10/2018) 150 mL 0  . lip balm (CARMEX) ointment Apply topically as needed for lip care. (Patient not taking: Reported on 10/21/2018) 7 g 0  . Nutritional Supplements (FEEDING SUPPLEMENT, GLUCERNA 1.2 CAL,) LIQD Place 1,000 mLs into feeding  tube daily. (Patient not taking: Reported on 01/13/2019)    . Nutritional Supplements (FEEDING SUPPLEMENT, GLUCERNA 1.2 CAL,) LIQD Place 400 mLs into  feeding tube daily. (Patient not taking: Reported on 10/21/2018)      ALLERGIES:   Allergies  Allergen Reactions  . Penicillins Hives    Has patient had a PCN reaction causing immediate rash, facial/tongue/throat swelling, SOB or lightheadedness with hypotension: Yes Has patient had a PCN reaction causing severe rash involving mucus membranes or skin necrosis: Yes Has patient had a PCN reaction that required hospitalization No Has patient had a PCN reaction occurring within the last 10 years: No-childhood allergy If all of the above answers are "NO", then may proceed with Cephalosporin  Tolerated cephalosporins in past  . Erythromycin Nausea And Vomiting  . Nitrofurantoin Monohyd Macro Nausea And Vomiting  . Oxybutynin Other (See Comments)    Dries mouth out     FAM HX: Family History  Problem Relation Age of Onset  . Hypertension Maternal Uncle   . Kidney failure Maternal Uncle   . Colon cancer Mother   . Lung cancer Father   . Hypertension Brother   . Kidney failure Maternal Aunt   . Kidney failure Maternal Uncle   . Colon cancer Maternal Grandfather   . Esophageal cancer Neg Hx     Social History:   reports that she has quit smoking. Her smoking use included cigarettes. She has a 25.00 pack-year smoking history. She has never used smokeless tobacco. She reports that she does not drink alcohol or use drugs.  ROS: ROS: unable to obtain d/t AMS  Blood pressure (!) 79/55, pulse 87, temperature 97.8 F (36.6 C), temperature source Oral, resp. rate 19, height 5\' 2"  (1.575 m), weight 46.8 kg, last menstrual period 05/27/2016, SpO2 97 %. PHYSICAL EXAM: Physical Exam GEN cachectic, unresponsive HEENT sclerae anicteric NECK + Trach in place PULM coarse mech bilaterally CV tachycardic ABD soft, mildly distended, suprapubic cath in place, PEG in place EXT sarcopenia and contractures NEURO unresponsive SKIN decubs not examined  Results for orders placed or performed during  the hospital encounter of 12/27/2018 (from the past 48 hour(s))  Heparin level (unfractionated)     Status: None   Collection Time: 01/18/19  6:44 PM  Result Value Ref Range   Heparin Unfractionated 0.36 0.30 - 0.70 IU/mL    Comment: (NOTE) If heparin results are below expected values, and patient dosage has  been confirmed, suggest follow up testing of antithrombin III levels. Performed at [facility], [place] [address], [place], [place] [address]   Magnesium     Status: Abnormal   Collection Time: 01/18/19  6:44 PM  Result Value Ref Range   Magnesium 1.4 (L) 1.7 - 2.4 mg/dL    Comment: Performed at [facility] [address], [place], [place] [address]  Phosphorus     Status: None   Collection Time: 01/18/19  6:44 PM  Result Value Ref Range   Phosphorus 2.5 2.5 - 4.6 mg/dL    Comment: Performed at [facility], [place] [address], [place], [place] [address]  Glucose, capillary     Status: Abnormal   Collection Time: 01/18/19  8:26 PM  Result Value Ref Range   Glucose-Capillary 209 (H) 70 - 99 mg/dL   Comment 1 Notify RN   Vancomycin, peak     Status: Abnormal   Collection Time: 01/18/19  9:30 PM  Result Value Ref Range   Vancomycin Pk  57 (HH) 30 - 40 ug/mL    Comment: RESULTS CONFIRMED BY MANUAL DILUTION CRITICAL RESULT CALLED TO, READ BACK BY AND VERIFIED WITH: PAYNE S,RN 01/18/19 [id number] Performed at [facility], [address] [place], [place] [address]   I-STAT 7, (LYTES, BLD GAS, ICA, H+H)     Status: Abnormal   Collection Time: 01/18/19  9:55 PM  Result Value Ref Range   pH, Arterial 7.236 (L) 7.350 - 7.450   pCO2 arterial 65.0 (H) 32.0 - 48.0 mmHg   pO2, Arterial 73.0 (L) 83.0 - 108.0 mmHg   Bicarbonate 27.6 20.0 - 28.0 mmol/L   TCO2 30 22 - 32 mmol/L   O2 Saturation 91.0 %   Acid-base deficit 1.0 0.0 - 2.0 mmol/L   Sodium 148 (H) 135 - 145 mmol/L   Potassium 4.6 3.5 - 5.1 mmol/L   Calcium, Ion 1.08 (L) 1.15 - 1.40 mmol/L   HCT 27.0 (L)  36.0 - 46.0 %   Hemoglobin 9.2 (L) 12.0 - 15.0 g/dL   Patient temperature 98.6 F    Collection site RADIAL, ALLEN'S TEST ACCEPTABLE    Drawn by VP    Sample type ARTERIAL   Glucose, capillary     Status: Abnormal   Collection Time: 01/18/19 11:31 PM  Result Value Ref Range   Glucose-Capillary 159 (H) 70 - 99 mg/dL   Comment 1 Notify RN   Magnesium     Status: Abnormal   Collection Time: 01/19/19  4:17 AM  Result Value Ref Range   Magnesium 1.6 (L) 1.7 - 2.4 mg/dL    Comment: Performed at [facility], [address], [place], [place] [address]  Phosphorus     Status: None   Collection Time: 01/19/19  4:17 AM  Result Value Ref Range   Phosphorus 2.8 2.5 - 4.6 mg/dL    Comment: Performed at [facility] [address] [place], [place] [address]  CBC     Status: Abnormal   Collection Time: 01/19/19  4:17 AM  Result Value Ref Range   WBC 11.8 (H) 4.0 - 10.5 K/uL   RBC 3.18 (L) 3.87 - 5.11 MIL/uL   Hemoglobin 8.3 (L) 12.0 - 15.0 g/dL   HCT 28.6 (L) 36.0 - 46.0 %   MCV 89.9 80.0 - 100.0 fL   MCH 26.1 26.0 - 34.0 pg   MCHC 29.0 (L) 30.0 - 36.0 g/dL   RDW 24.6 (H) 11.5 - 15.5 %   Platelets 583 (H) 150 - 400 K/uL   nRBC 0.0 0.0 - 0.2 %    Comment: Performed at [facility] [address] [place], [place] [address]  Heparin level (unfractionated)     Status: None   Collection Time: 01/19/19  4:17 AM  Result Value Ref Range   Heparin Unfractionated 0.36 0.30 - 0.70 IU/mL    Comment: (NOTE) If heparin results are below expected values, and patient dosage has  been confirmed, suggest follow up testing of antithrombin III levels. Performed at [facility], [place] [address] [place], [place] [address]   Glucose, capillary     Status: None   Collection Time: 01/19/19  4:23 AM  Result Value Ref Range   Glucose-Capillary 92 70 - 99 mg/dL  Glucose, capillary     Status: None   Collection Time: 01/19/19  8:22 AM  Result Value Ref Range   Glucose-Capillary 72  70 - 99 mg/dL  Glucose, capillary  Status: Abnormal   Collection Time: 01/19/19 11:42 AM  Result Value Ref Range   Glucose-Capillary 107 (H) 70 - 99 mg/dL  Glucose, capillary     Status: Abnormal   Collection Time: 01/19/19  3:50 PM  Result Value Ref Range   Glucose-Capillary 174 (H) 70 - 99 mg/dL  Magnesium     Status: None   Collection Time: 01/19/19  6:21 PM  Result Value Ref Range   Magnesium 2.2 1.7 - 2.4 mg/dL    Comment: Performed at [facility], [place] [address] [place], [place] [address]  Phosphorus     Status: None   Collection Time: 01/19/19  6:21 PM  Result Value Ref Range   Phosphorus 2.9 2.5 - 4.6 mg/dL    Comment: Performed at [facility] [address], [place], [place] [address]  Comprehensive metabolic panel     Status: Abnormal   Collection Time: 01/19/19  6:21 PM  Result Value Ref Range   Sodium 138 135 - 145 mmol/L    Comment: DELTA CHECK NOTED   Potassium 4.2 3.5 - 5.1 mmol/L   Chloride 115 (H) 98 - 111 mmol/L   CO2 16 (L) 22 - 32 mmol/L   Glucose, Bld 201 (H) 70 - 99 mg/dL   BUN 87 (H) 6 - 20 mg/dL   Creatinine, Ser 0.94 0.44 - 1.00 mg/dL   Calcium 9.2 8.9 - 10.3 mg/dL   Total Protein 6.4 (L) 6.5 - 8.1 g/dL   Albumin 3.0 (L) 3.5 - 5.0 g/dL   AST 39 15 - 41 U/L   ALT 25 0 - 44 U/L   Alkaline Phosphatase 615 (H) 38 - 126 U/L   Total Bilirubin 1.2 0.3 - 1.2 mg/dL   GFR calc non Af Amer >60 >60 mL/min   GFR calc Af Amer >60 >60 mL/min   Anion gap 7 5 - 15    Comment: Performed at [facility], [place] [address] [place], [place] [address]  Vancomycin, random     Status: None   Collection Time: 01/19/19  6:21 PM  Result Value Ref Range   Vancomycin Rm 43     Comment:        Random Vancomycin therapeutic range is dependent on dosage and time of specimen collection. A peak range is 20.0-40.0 ug/mL A trough range is 5.0-15.0 ug/mL        Performed at [facility] [address], [place], [place] [address]   Glucose,  capillary     Status: Abnormal   Collection Time: 01/19/19  8:01 PM  Result Value Ref Range   Glucose-Capillary 213 (H) 70 - 99 mg/dL  Glucose, capillary     Status: Abnormal   Collection Time: 01/20/19 12:39 AM  Result Value Ref Range   Glucose-Capillary 209 (H) 70 - 99 mg/dL  Glucose, capillary     Status: Abnormal   Collection Time: 01/20/19  4:41 AM  Result Value Ref Range   Glucose-Capillary 102 (H) 70 - 99 mg/dL  CBC     Status: Abnormal   Collection Time: 01/20/19  5:00 AM  Result Value Ref Range   WBC 20.5 (H) 4.0 - 10.5 K/uL   RBC 3.47 (L) 3.87 - 5.11 MIL/uL   Hemoglobin 9.0 (L) 12.0 - 15.0 g/dL   HCT 31.0 (L) 36.0 - 46.0 %   MCV 89.3 80.0 - 100.0 fL   MCH 25.9 (L) 26.0 - 34.0 pg   MCHC 29.0 (  L) 30.0 - 36.0 g/dL   RDW 24.9 (H) 11.5 - 15.5 %   Platelets 741 (H) 150 - 400 K/uL   nRBC 0.5 (H) 0.0 - 0.2 %    Comment: Performed at [facility] [address], [place], [place] [address]  Comprehensive metabolic panel     Status: Abnormal   Collection Time: 01/20/19  5:00 AM  Result Value Ref Range   Sodium 140 135 - 145 mmol/L   Potassium 5.0 3.5 - 5.1 mmol/L   Chloride 115 (H) 98 - 111 mmol/L   CO2 16 (L) 22 - 32 mmol/L   Glucose, Bld 120 (H) 70 - 99 mg/dL   BUN 103 (H) 6 - 20 mg/dL   Creatinine, Ser 1.04 (H) 0.44 - 1.00 mg/dL   Calcium 7.9 (L) 8.9 - 10.3 mg/dL   Total Protein 6.3 (L) 6.5 - 8.1 g/dL   Albumin 2.6 (L) 3.5 - 5.0 g/dL   AST 42 (H) 15 - 41 U/L   ALT 28 0 - 44 U/L   Alkaline Phosphatase 632 (H) 38 - 126 U/L   Total Bilirubin 1.2 0.3 - 1.2 mg/dL   GFR calc non Af Amer >60 >60 mL/min   GFR calc Af Amer >60 >60 mL/min   Anion gap 9 5 - 15    Comment: Performed at [facility], [place] [address], [place], [place] [address]  Glucose, capillary     Status: Abnormal   Collection Time: 01/20/19  8:15 AM  Result Value Ref Range   Glucose-Capillary 154 (H) 70 - 99 mg/dL  Glucose, capillary     Status: Abnormal   Collection Time: 01/20/19 11:15 AM   Result Value Ref Range   Glucose-Capillary 142 (H) 70 - 99 mg/dL  I-STAT 7, (LYTES, BLD GAS, ICA, H+H)     Status: Abnormal   Collection Time: 01/20/19 12:10 PM  Result Value Ref Range   pH, Arterial 7.083 (LL) 7.350 - 7.450   pCO2 arterial 52.4 (H) 32.0 - 48.0 mmHg   pO2, Arterial 67.0 (L) 83.0 - 108.0 mmHg   Bicarbonate 15.6 (L) 20.0 - 28.0 mmol/L   TCO2 17 (L) 22 - 32 mmol/L   O2 Saturation 84.0 %   Acid-base deficit 14.0 (H) 0.0 - 2.0 mmol/L   Sodium 143 135 - 145 mmol/L   Potassium 3.8 3.5 - 5.1 mmol/L   Calcium, Ion 1.50 (H) 1.15 - 1.40 mmol/L   HCT 32.0 (L) 36.0 - 46.0 %   Hemoglobin 10.9 (L) 12.0 - 15.0 g/dL   Patient temperature HIDE    Collection site RADIAL, ALLEN'S TEST ACCEPTABLE    Drawn by RT    Sample type ARTERIAL    Comment NOTIFIED PHYSICIAN   Creatinine, serum     Status: Abnormal   Collection Time: 01/20/19 12:56 PM  Result Value Ref Range   Creatinine, Ser 1.12 (H) 0.44 - 1.00 mg/dL   GFR calc non Af Amer 58 (L) >60 mL/min   GFR calc Af Amer >60 >60 mL/min    Comment: Performed at [facility], [address] [place], [place] [address]  Lactic acid, plasma     Status: None   Collection Time: 01/20/19 12:56 PM  Result Value Ref Range   Lactic Acid, Venous 0.8 0.5 - 1.9 mmol/L    Comment: Performed at [facility] [address], [place], [place] [address]  Procalcitonin - Baseline     Status: None  Collection Time: 01/20/19 12:56 PM  Result Value Ref Range   Procalcitonin 0.42 ng/mL    Comment:        Interpretation: PCT (Procalcitonin) <= 0.5 ng/mL: Systemic infection (sepsis) is not likely. Local bacterial infection is possible. (NOTE)       Sepsis PCT Algorithm           Lower Respiratory Tract                                      Infection PCT Algorithm    ----------------------------     ----------------------------         PCT < 0.25 ng/mL                PCT < 0.10 ng/mL         Strongly encourage             Strongly  discourage   discontinuation of antibiotics    initiation of antibiotics    ----------------------------     -----------------------------       PCT 0.25 - 0.50 ng/mL            PCT 0.10 - 0.25 ng/mL               OR       >80% decrease in PCT            Discourage initiation of                                            antibiotics      Encourage discontinuation           of antibiotics    ----------------------------     -----------------------------         PCT >= 0.50 ng/mL              PCT 0.26 - 0.50 ng/mL               AND        <80% decrease in PCT             Encourage initiation of                                             antibiotics       Encourage continuation           of antibiotics    ----------------------------     -----------------------------        PCT >= 0.50 ng/mL                  PCT > 0.50 ng/mL               AND         increase in PCT                  Strongly encourage                                      initiation of antibiotics    Strongly encourage escalation  of antibiotics                                     -----------------------------                                           PCT <= 0.25 ng/mL                                                 OR                                        > 80% decrease in PCT                                     Discontinue / Do not initiate                                             antibiotics Performed at [facility], [place] [address] [place], [place] [address]     DG Abd 1 View  Result Date: 01/19/2019 CLINICAL DATA:  48 year old female with history of suprapubic catheter. EXAM: ABDOMEN - 1 VIEW COMPARISON:  CT the abdomen and pelvis 12/27/2018. FINDINGS: No definite suprapubic catheter identified. Radiopaque structure projecting over the low pelvis may either represent a collection bag associated with an open stoma, or a rectal bag. Percutaneous gastrostomy tube projecting over the epigastric  region. Gas is noted in the colon. Paucity of rectal gas. No pathologic dilatation of small bowel. No gross pneumoperitoneum noted on today's single supine view. IMPRESSION: 1. Support apparatus, as above. 2. Nonobstructive bowel gas pattern. Electronically Signed   By: Vinnie Langton M.D.   On: 01/19/2019 20:16   DG CHEST PORT 1 VIEW  Result Date: 01/20/2019 CLINICAL DATA:  hypoxemia EXAM: PORTABLE CHEST 1 VIEW COMPARISON:  Chest radiograph 01/18/2019 FINDINGS: Limited study due to patient rotation. Stable cardiomediastinal contours. Unchanged support apparatus. Persistent small bilateral pleural effusions and bibasilar opacities which could reflect atelectasis and or infiltrates. No pneumothorax. No acute finding in the visualized skeleton. IMPRESSION: Limited study due to patient rotation but chest appears stable with bibasilar effusions and opacities which could reflect atelectasis and/or infiltrates. Electronically Signed   By: Audie Pinto M.D.   On: 01/20/2019 15:10   VAS [name] LOWER EXTREMITY VENOUS (DVT)  Result Date: 01/19/2019  Lower Venous Study Indications: Hypoxia.  Risk Factors: COVID 19 positive. Limitations: Body habitus, poor ultrasound/tissue interface, bandages and patient positioning, patient immobility, patient contractures. Comparison Study: No prior studies. Performing Technologist: Oliver Hum RVT  Examination Guidelines: A complete evaluation includes B-mode imaging, spectral Doppler, color Doppler, and power Doppler as needed of all accessible portions of each vessel. Bilateral testing is considered an integral part of a complete examination. Limited examinations for reoccurring indications may be performed as noted.  +---------+---------------+---------+-----------+----------+--------------+ RIGHT    CompressibilityPhasicitySpontaneityPropertiesThrombus Aging +---------+---------------+---------+-----------+----------+--------------+  CFV      Full           Yes       Yes                                 +---------+---------------+---------+-----------+----------+--------------+ SFJ      Full                                                        +---------+---------------+---------+-----------+----------+--------------+ FV Prox  Full                                                        +---------+---------------+---------+-----------+----------+--------------+ FV Mid   Full                                                        +---------+---------------+---------+-----------+----------+--------------+ FV DistalFull                                                        +---------+---------------+---------+-----------+----------+--------------+ PFV      Full                                                        +---------+---------------+---------+-----------+----------+--------------+ POP      Full           Yes      Yes                                 +---------+---------------+---------+-----------+----------+--------------+ PTV      Full                                                        +---------+---------------+---------+-----------+----------+--------------+ PERO     Full                                                        +---------+---------------+---------+-----------+----------+--------------+   +---------+---------------+---------+-----------+----------+--------------+ LEFT     CompressibilityPhasicitySpontaneityPropertiesThrombus Aging +---------+---------------+---------+-----------+----------+--------------+ CFV      Full           Yes      Yes                                 +---------+---------------+---------+-----------+----------+--------------+  SFJ      Full                                                        +---------+---------------+---------+-----------+----------+--------------+ FV Prox  Full                                                         +---------+---------------+---------+-----------+----------+--------------+ FV Mid   Full                                                        +---------+---------------+---------+-----------+----------+--------------+ FV DistalFull                                                        +---------+---------------+---------+-----------+----------+--------------+ PFV      Full                                                        +---------+---------------+---------+-----------+----------+--------------+ POP      Full           Yes      Yes                                 +---------+---------------+---------+-----------+----------+--------------+ PTV      Full                                                        +---------+---------------+---------+-----------+----------+--------------+ PERO     Full                                                        +---------+---------------+---------+-----------+----------+--------------+     Summary: Right: There is no evidence of deep vein thrombosis in the lower extremity. No cystic structure found in the popliteal fossa. Left: There is no evidence of deep vein thrombosis in the lower extremity. No cystic structure found in the popliteal fossa.  *See table(s) above for measurements and observations. Electronically signed by Monica Martinez MD on 01/19/2019 at 5:43:59 PM.    Final    VAS [name] UPPER EXTREMITY VENOUS DUPLEX  Result Date: 01/19/2019 UPPER VENOUS STUDY  Indications: Edema Risk Factors: COVID 19 positive. Limitations: Poor ultrasound/tissue interface, body habitus, patient positioning, patient immobility, patient contractures, bandages and line. Comparison Study: No  prior studies. Performing Technologist: Oliver Hum RVT  Examination Guidelines: A complete evaluation includes B-mode imaging, spectral Doppler, color Doppler, and power Doppler as needed of all accessible portions of each vessel. Bilateral  testing is considered an integral part of a complete examination. Limited examinations for reoccurring indications may be performed as noted.  Right Findings: +----------+------------+---------+-----------+----------+--------------+ RIGHT     CompressiblePhasicitySpontaneousProperties   Summary     +----------+------------+---------+-----------+----------+--------------+ IJV           Full       Yes       Yes                             +----------+------------+---------+-----------+----------+--------------+ Subclavian    Full       Yes       Yes                             +----------+------------+---------+-----------+----------+--------------+ Axillary      Full       Yes       Yes                             +----------+------------+---------+-----------+----------+--------------+ Brachial                                            Not visualized +----------+------------+---------+-----------+----------+--------------+ Radial        Full                                                 +----------+------------+---------+-----------+----------+--------------+ Ulnar         Full                                                 +----------+------------+---------+-----------+----------+--------------+ Cephalic      Full                                                 +----------+------------+---------+-----------+----------+--------------+ Basilic       Full                                                 +----------+------------+---------+-----------+----------+--------------+  Left Findings: +----------+------------+---------+-----------+----------+-------+ LEFT      CompressiblePhasicitySpontaneousPropertiesSummary +----------+------------+---------+-----------+----------+-------+ IJV           Full       Yes       Yes                      +----------+------------+---------+-----------+----------+-------+ Subclavian    Full       Yes        Yes                      +----------+------------+---------+-----------+----------+-------+  Axillary      Full       Yes       Yes                      +----------+------------+---------+-----------+----------+-------+ Brachial      Full       Yes       Yes                      +----------+------------+---------+-----------+----------+-------+ Radial        Full                                          +----------+------------+---------+-----------+----------+-------+ Ulnar         Full                                          +----------+------------+---------+-----------+----------+-------+ Cephalic      Full                                          +----------+------------+---------+-----------+----------+-------+ Basilic       Full                                          +----------+------------+---------+-----------+----------+-------+  Summary:  Right: No evidence of deep vein thrombosis in the upper extremity. No evidence of superficial vein thrombosis in the upper extremity.  Left: No evidence of deep vein thrombosis in the upper extremity. No evidence of superficial vein thrombosis in the upper extremity.  *See table(s) above for measurements and observations.  Diagnosing physician: Monica Martinez MD Electronically signed by Monica Martinez MD on 01/19/2019 at 5:43:05 PM.    Final    US Abdomen Limited RUQ  Result Date: 01/19/2019 CLINICAL DATA:  Elevated alkaline phosphatase EXAM: ULTRASOUND ABDOMEN LIMITED RIGHT UPPER QUADRANT COMPARISON:  Ultrasound 01/15/2019.  Chest CT 01/17/2019 FINDINGS: Gallbladder: Marked gallbladder wall thickening measuring up to 8 mm, worsening since prior study. Small mobile gallstones, the largest 7 mm. Common bile duct: Diameter: Normal caliber, 4 mm Liver: No focal lesion identified. Within normal limits in parenchymal echogenicity. Portal vein is patent on color Doppler imaging with normal direction of blood flow  towards the liver. Other: Small amount of perihepatic ascites. IMPRESSION: Worsening gallbladder wall thickening, now measuring up to 8 mm in thickness. There are small gallstones. Negative sonographic Murphy's sign. If further evaluation is felt warranted, nuclear medicine hepatobiliary scan may be helpful to assess for cystic duct patency. Small amount of perihepatic ascites. Electronically Signed   By: Rolm Baptise M.D.   On: 01/19/2019 08:41    Assessment/Plan 1.  AKI: likely d/t CTA and vanc toxicity.  Given quadriplegia, her BUN and Cr actually represent a significant AKI in the setting of decreased muscle mass.  She is not oliguric now.  Agree with medical management of acidosis with bicarb gtt and putting back on vent--> she is a poor short or long term dialysis candidate  2.  Acute hypoxic RF: back on vent, had a mucus plug 12/29, per PCCM  3.  Combined metabolic and respiratory acidosis: bicarb gtt and vent support as above  4.  Quadriplegia: C5-7 injury  5.  COVID 19: decadron, remdesivir  6.  Septic shock: on linezolid, cefepime now  7.  Dispo: I fully support palliative care discussions  Madelon Lips 01/20/2019, 3:53 PM

## 2019-01-20 NOTE — Procedures (Signed)
Tracheostomy Change Note  Patient Details:   Name: Angela Page DOB: August 27, 1970 MRN: [id number]    Airway Documentation:     Evaluation  O2 sats: stable throughout Complications: No apparent complications Patient did tolerate procedure well. Bilateral Breath Sounds: Diminished    Pt trach changed from a 6 cuffless shiley to a 4 cuffed shiley to be able to place on ventilator. RT unable to place 6 cuffed and per MD okay to use 4 cuffed. Trach secured, postitive color change on the end tidal CO2 monitor and pt placed on ventilator. Bilateral breath sounds heard and return volumes documented on the ventilator. RT will continue to monitor.   Jonathon Jordan Nary Sneed 01/20/2019, 4:06 PM

## 2019-01-20 NOTE — Progress Notes (Signed)
CCM MD notified of RT only being able to place 4 cuffed shiley trach into pt. No new orders at this time. RT will continue to monitor.

## 2019-01-20 NOTE — Progress Notes (Signed)
PROGRESS NOTE    Angela Page  [id number] DOB: [birth year] DOA: 12/30/2018 PCP: Default, Provider, MD   Brief Narrative: 48 year old with past medical history significant for history of quadriplegia following spinal cord injury with trach and PEG dependency, chronically on 3 L, chronic diastolic heart failure, neurogenic orthostatic hypotension, COPD who presents from facility with concerns of lethargy.  Patient is able to nod to yes or notoquestions andattempts to answer questions although difficult to hear due to her tracheostomy. Reportedly she was sent over by staff due to lethargy and concerns for sepsis. She was found to be hypoxic down to 80% on 4 L and had to be placed on nonrebreather by EMS.   CT abdomen and pelvis showing suprapubic catheter appearing to protrude through posterior bladder wall with fluid in the pelvis.   Urology was consulted, SPT replaced by urology on 12/24, continue gravity drainage, return to normal interval exchanges at facility and CT cystogram to evaluate bladder.   Left tibia/fibula x-ray showing osseous demineralization concerning for osteomyelitis and orthopedic surgery was consulted who recommended ID consult, wound care and conservative management and did not recommend surgery at this time.  On 12/29 patient acutely decompensated with increased respiratory distress and oxygen requirement and started on heparin drip apophylactically.  She was evaluated by CCM and recommendation was to check Dopplers and discontinue heparin drip.  Dopplers have been negative for DVT  12-31; Patient more lethargic, develop worsening metabolic acidosis. CCM informed of change in condition. Plan to start Bicarb gtt.  Nephrology consulted for AKI and metabolic acidosis. Vancomycin  level on 12-29 at 57. Placed Back on  Vent support.   Assessment & Plan:   Active Problems:   Quadriplegia ([facility])   Decubitus ulcer of sacral region, stage 4 (HCC)   Anemia, iron  deficiency   Tracheostomy status (HCC)   PEG (percutaneous endoscopic gastrostomy) status ([place])   Complicated UTI (urinary tract infection)   Sepsis (HCC)   Hyperkalemia   COVID-19   Osteomyelitis of left fibula (HCC)   Palliative care encounter   1-Acute on chronic hypoxic respiratory failure in the setting of Covid infection with tracheostomy, Acutely decompensated the morning of 12/29 likely related to mucous plug. Heparin drip has been discontinued. CTA 12/28 - for PE Continue with IV Decadron and remdesivir Appreciate Dr. Chase Caller evaluation. Develops worsening respiratory acidosis, now on Vent support.   Elevated ammonia: Resume lactulose.  Repeat ammonia level in am.   Anxiety insomnia: Ativan discontinue as my have contributed to her lethargy Continue with Rozerem  Cellulitis of the back: Continue with  cefepime per ID Change vancomycin to cefepime due to concern for vancomycin toxicity.   Osteomyelitis of the left fibula, left greater trochanter, distal sacrum and left hip Orthopedic was consulted recommend medical management no surgical indication ID consulted who recommended continue vancomycin and cefepime ID  recommending long-term p.o. antibiotics with Bactrim at discharge.  Proteus UTI with suprapubic catheter: CT abdomen and pelvis showing suprapubic catheter appearing to protrude through posterior bladder wall with fluid in the pelvis.   Urology was consulted, SPT replaced by urology on 12/24, continue gravity drainage, return to normal interval exchanges at facility and CT cystogram to evaluate bladder.  Continue with IV antibiotics Discussed with urology, will try myrbetriq for bladder spasm and help with leaking.  Had 200 cc urine out in bag. Continue to monitor out put.   Stage IV decubitus ulcer: Continue with wound care.  Hypotension; chronic Vaso-plegia;  Received albumin.  Started on midodrine.  On hydrocortisone. Change to every 8 hours.  Proceed with taper.  Might need florinef.  Sick sinus syndrome status post pacemaker PEG tube dependent continue with supplementation.  Elevated liver function test: Labs pending for the morning.  [name]; Worsening Gallbladder wall thickening, measuring u to 8 mm in thickness, negative murphy sign.  Discussed with surgery proceed with HIDA scan.  HIDA scan pending.   Quadriplegic due to spinal cord injury Poor prognosis with ongoing infections -Palliative care consulted.  Acute on Chronic anemia status post 1 unit PRBCs Unclear etiology.  Stable : Replete IV  Anemia; Acute on Chronic.  Hb stable.  Received one unit PRBC>    Pressure Injury 07/20/18 Sacrum (Active)  07/20/18 0146  Location: Sacrum  Location Orientation:   Staging:   Wound Description (Comments):   Present on Admission:      Pressure Injury Ischial tuberosity Left (Active)     Location: Ischial tuberosity  Location Orientation: Left  Staging:   Wound Description (Comments):   Present on Admission:      Pressure Injury Ischial tuberosity Right (Active)     Location: Ischial tuberosity  Location Orientation: Right  Staging:   Wound Description (Comments):   Present on Admission:      Pressure Injury 01/15/19 Vertebral column Left (Active)  01/15/19 0540  Location: Vertebral column  Location Orientation: Left  Staging:   Wound Description (Comments):   Present on Admission: Yes     Pressure Injury 01/15/19 Hip Right Stage 2 -  Partial thickness loss of dermis presenting as a shallow open injury with a red, pink wound bed without slough. (Active)  01/15/19 1710  Location: Hip  Location Orientation: Right  Staging: Stage 2 -  Partial thickness loss of dermis presenting as a shallow open injury with a red, pink wound bed without slough.  Wound Description (Comments):   Present on Admission: Yes     Nutrition Problem: Increased nutrient needs Etiology: chronic illness, acute illness(acute on  chronic hypoxia in the setting of COVID-19 infection with tracheostomy; osteomyelitis of left fibula;greater trochanter, stage IV decubitis ulcer; UTI)    Signs/Symptoms: estimated needs    Interventions: Tube feeding, Prostat, MVI, Juven  Estimated body mass index is 18.87 kg/m as calculated from the following:   Height as of 08/11/18: 5\' 2"  (1.575 m).   Weight as of this encounter: 46.8 kg.   DVT prophylaxis: Heparin  Code Status: partial code Family Communication: Mother updated over  Phone Disposition Plan: remain in the hospital for treatment of resp failure and hypoxemia.  Consultants:   CCM    Procedures:  [name]; Worsening gallbladder wall thickening, now measuring up to 8 mm in thickness. There are small gallstones. Negative sonographic Murphy's sign. If further evaluation is felt warranted, nuclear medicine hepatobiliary scan may be helpful to assess for cystic duct patency.    Antimicrobials:  Linezolid Cefepime  Subjective: She is lethargic today, not following command.    Objective: Vitals:   01/20/19 0800 01/20/19 0900 01/20/19 1000 01/20/19 1100  BP: 100/66 91/61 102/71 93/67  Pulse: (!) 111 (!) 102 (!) 104 94  Resp: (!) 34 (!) 26 (!) 32 (!) 30  Temp:      TempSrc:      SpO2: 96% 96% 97% 96%  Weight:        Intake/Output Summary (Last 24 hours) at 01/20/2019 1136 Last data filed at 01/20/2019 1008 Gross per 24 hour  Intake 1424 ml  Output 175 ml  Net 1249 ml   Filed Weights   12/29/2018 1704 01/20/19 0433  Weight: 46.5 kg 46.8 kg    Examination:  General exam: Lethargic.  Respiratory system:Trach in place, Bilateral air movement.  Cardiovascular system: S 1, S 2 RRR  Gastrointestinal system: BS present, peg tube in place, supra-pubic catheter in place.  Central nervous system: Lethargic, quadriplegic, not following command Extremities: No edema     Data Reviewed: I have personally reviewed following labs and imaging  studies  CBC: Recent Labs  Lab 01/16/19 0310 01/17/19 0428 01/18/19 0323 01/18/19 1532 01/18/19 2155 01/19/19 0417 01/20/19 0500  WBC 6.1 7.2 9.3  --   --  11.8* 20.5*  HGB 8.2* 8.3* 8.6* 8.5* 9.2* 8.3* 9.0*  HCT 27.9* 28.5* 29.0* 28.9* 27.0* 28.6* 31.0*  MCV 89.4 88.8 87.3  --   --  89.9 89.3  PLT 460* 466* 448*  --   --  583* 0000000*   Basic Metabolic Panel: Recent Labs  Lab 01/13/19 1900 01/16/19 0310 01/17/19 0428 01/18/19 0323 01/18/19 1322 01/18/19 1844 01/18/19 2155 01/19/19 0417 01/19/19 1821 01/20/19 0500  NA 134* 135 134* 135  --   --  148*  --  138 140  K 4.3 3.3* 3.6 3.2*  --   --  4.6  --  4.2 5.0  CL 108 111 109 111  --   --   --   --  115* 115*  CO2 16* 18* 18* 17*  --   --   --   --  16* 16*  GLUCOSE 58* 153* 129* 142*  --   --   --   --  201* 120*  BUN 19 20 26* 51*  --   --   --   --  87* 103*  CREATININE 0.75 0.62 0.57 0.53  --   --   --   --  0.94 1.04*  CALCIUM 8.3* 8.1* 8.4* 8.7*  --   --   --   --  9.2 7.9*  MG 1.7  --   --   --  1.5* 1.4*  --  1.6* 2.2  --   PHOS  --   --   --   --  3.0 2.5  --  2.8 2.9  --    GFR: Estimated Creatinine Clearance: 48.9 mL/min (A) (by C-G formula based on SCr of 1.04 mg/dL (H)). Liver Function Tests: Recent Labs  Lab 01/16/19 0310 01/17/19 0428 01/18/19 0323 01/19/19 1821 01/20/19 0500  AST 36 46* 39 39 42*  ALT 24 32 28 25 28   ALKPHOS 957* 957* 825* 615* 632*  BILITOT 1.0 1.1 1.3* 1.2 1.2  PROT 5.9* 6.2* 6.2* 6.4* 6.3*  ALBUMIN 1.4* 1.4* 1.8* 3.0* 2.6*   No results for input(s): LIPASE, AMYLASE in the last 168 hours. Recent Labs  Lab 01/16/19 1010 01/18/19 0323  AMMONIA 59* 55*   Coagulation Profile: No results for input(s): INR, PROTIME in the last 168 hours. Cardiac Enzymes: No results for input(s): CKTOTAL, CKMB, CKMBINDEX, TROPONINI in the last 168 hours. BNP (last 3 results) No results for input(s): PROBNP in the last 8760 hours. HbA1C: No results for input(s): HGBA1C in the last 72  hours. CBG: Recent Labs  Lab 01/19/19 2001 01/20/19 0039 01/20/19 0441 01/20/19 0815 01/20/19 1115  GLUCAP 213* 209* 102* 154* 142*   Lipid Profile: No results for input(s): CHOL, HDL, LDLCALC, TRIG, CHOLHDL, LDLDIRECT in the last 72 hours. Thyroid Function Tests: No results for input(s): TSH,  T4TOTAL, FREET4, T3FREE, THYROIDAB in the last 72 hours. Anemia Panel: No results for input(s): VITAMINB12, FOLATE, FERRITIN, TIBC, IRON, RETICCTPCT in the last 72 hours. Sepsis Labs: No results for input(s): PROCALCITON, LATICACIDVEN in the last 168 hours.  Recent Results (from the past 240 hour(s))  Culture, blood (routine x 2)     Status: None   Collection Time: 01/13/2019  5:35 PM   Specimen: BLOOD LEFT HAND  Result Value Ref Range Status   Specimen Description BLOOD LEFT HAND  Final   Special Requests   Final    BOTTLES DRAWN AEROBIC AND ANAEROBIC Blood Culture adequate volume   Culture   Final    NO GROWTH 5 DAYS Performed at [facility], [address], [place], [place] [address]    Report Status 01/17/2019 FINAL  Final  Culture, blood (Routine X 2) w Reflex to ID Panel     Status: None   Collection Time: 01/02/2019  6:04 PM   Specimen: BLOOD RIGHT FOREARM  Result Value Ref Range Status   Specimen Description BLOOD RIGHT FOREARM  Final   Special Requests   Final    BOTTLES DRAWN AEROBIC AND ANAEROBIC Blood Culture adequate volume   Culture   Final    NO GROWTH 5 DAYS Performed at [facility], [place] [address], [place], [place] [address]    Report Status 01/17/2019 FINAL  Final  Respiratory Panel by RT PCR (Flu A&B, Covid) - Nasopharyngeal Swab     Status: Abnormal   Collection Time: 01/06/2019  6:26 PM   Specimen: Nasopharyngeal Swab  Result Value Ref Range Status   SARS Coronavirus 2 by RT PCR POSITIVE (A) NEGATIVE Final    Comment: RESULT CALLED TO, READ BACK BY AND VERIFIED WITH: P PEICKERT RN 01/01/2019 1948 JDW (NOTE) SARS-CoV-2 target nucleic acids are  DETECTED. SARS-CoV-2 RNA is generally detectable in upper respiratory specimens  during the acute phase of infection. Positive results are indicative of the presence of the identified virus, but do not rule out bacterial infection or co-infection with other pathogens not detected by the test. Clinical correlation with patient history and other diagnostic information is necessary to determine patient infection status. The expected result is Negative. Fact Sheet for Patients:  PinkCheek.be Fact Sheet for Healthcare Providers: GravelBags.it This test is not yet approved or cleared by the [place] FDA and  has been authorized for detection and/or diagnosis of SARS-CoV-2 by FDA under an Emergency Use Authorization (EUA).  This EUA will remain in effect (meaning this test can be used) for t he duration of  the COVID-19 declaration under Section 564(b)(1) of the Act, 21 U.S.C. section 360bbb-3(b)(1), unless the authorization is terminated or revoked sooner.    Influenza A by PCR NEGATIVE NEGATIVE Final   Influenza B by PCR NEGATIVE NEGATIVE Final    Comment: (NOTE) The Xpert Xpress SARS-CoV-2/FLU/RSV assay is intended as an aid in  the diagnosis of influenza from Nasopharyngeal swab specimens and  should not be used as a sole basis for treatment. Nasal washings and  aspirates are unacceptable for Xpert Xpress SARS-CoV-2/FLU/RSV  testing. Fact Sheet for Patients: PinkCheek.be Fact Sheet for Healthcare Providers: GravelBags.it This test is not yet approved or cleared by the [place] FDA and  has been authorized for detection and/or diagnosis of SARS-CoV-2 by  FDA under an Emergency Use Authorization (EUA). This EUA will remain  in effect (meaning this test can be used) for the duration of the  Covid-19  declaration under Section 564(b)(1) of the Act, 21  U.S.C.  section 360bbb-3(b)(1), unless the authorization is  terminated or revoked. Performed at [facility], [place] [address] [place], [place] [address]   Urine culture     Status: Abnormal   Collection Time: 01/20/2019  8:22 PM   Specimen: Urine, Catheterized  Result Value Ref Range Status   Specimen Description URINE, CATHETERIZED  Final   Special Requests   Final    NONE Performed at [facility], [address] [place], [place] [address]    Culture >=100,000 COLONIES/mL PROTEUS MIRABILIS (A)  Final   Report Status 01/15/2019 FINAL  Final   Organism ID, Bacteria PROTEUS MIRABILIS (A)  Final      Susceptibility   Proteus mirabilis - MIC*    AMPICILLIN <=2 SENSITIVE Sensitive     CEFAZOLIN 8 SENSITIVE Sensitive     CEFTRIAXONE <=1 SENSITIVE Sensitive     CIPROFLOXACIN >=4 RESISTANT Resistant     GENTAMICIN <=1 SENSITIVE Sensitive     IMIPENEM 4 SENSITIVE Sensitive     NITROFURANTOIN 128 RESISTANT Resistant     TRIMETH/SULFA <=20 SENSITIVE Sensitive     AMPICILLIN/SULBACTAM <=2 SENSITIVE Sensitive     PIP/TAZO <=4 SENSITIVE Sensitive     * >=100,000 COLONIES/mL PROTEUS MIRABILIS  MRSA PCR Screening     Status: Abnormal   Collection Time: 01/17/19 11:36 AM   Specimen: Nasal Mucosa; Nasopharyngeal  Result Value Ref Range Status   MRSA by PCR POSITIVE (A) NEGATIVE Final    Comment:        The GeneXpert MRSA Assay (FDA approved for NASAL specimens only), is one component of a comprehensive MRSA colonization surveillance program. It is not intended to diagnose MRSA infection nor to guide or monitor treatment for MRSA infections. RESULT CALLED TO, READ BACK BY AND VERIFIED WITH: Elon Jester RN 13:40 01/17/19 ([email or web address]) Performed at [facility], [place] [address] [place], [place] [address]          Radiology Studies: DG Abd 1 View  Result Date: 01/19/2019 CLINICAL DATA:  48 year old female with history of suprapubic catheter. EXAM: ABDOMEN - 1 VIEW  COMPARISON:  CT the abdomen and pelvis 01/06/2019. FINDINGS: No definite suprapubic catheter identified. Radiopaque structure projecting over the low pelvis may either represent a collection bag associated with an open stoma, or a rectal bag. Percutaneous gastrostomy tube projecting over the epigastric region. Gas is noted in the colon. Paucity of rectal gas. No pathologic dilatation of small bowel. No gross pneumoperitoneum noted on today's single supine view. IMPRESSION: 1. Support apparatus, as above. 2. Nonobstructive bowel gas pattern. Electronically Signed   By: Vinnie Langton M.D.   On: 01/19/2019 20:16   VAS [name] LOWER EXTREMITY VENOUS (DVT)  Result Date: 01/19/2019  Lower Venous Study Indications: Hypoxia.  Risk Factors: COVID 19 positive. Limitations: Body habitus, poor ultrasound/tissue interface, bandages and patient positioning, patient immobility, patient contractures. Comparison Study: No prior studies. Performing Technologist: Oliver Hum RVT  Examination Guidelines: A complete evaluation includes B-mode imaging, spectral Doppler, color Doppler, and power Doppler as needed of all accessible portions of each vessel. Bilateral testing is considered an integral part of a complete examination. Limited examinations for reoccurring indications may be performed as noted.  +---------+---------------+---------+-----------+----------+--------------+ RIGHT    CompressibilityPhasicitySpontaneityPropertiesThrombus Aging +---------+---------------+---------+-----------+----------+--------------+ CFV      Full           Yes      Yes                                 +---------+---------------+---------+-----------+----------+--------------+  SFJ      Full                                                        +---------+---------------+---------+-----------+----------+--------------+ FV Prox  Full                                                         +---------+---------------+---------+-----------+----------+--------------+ FV Mid   Full                                                        +---------+---------------+---------+-----------+----------+--------------+ FV DistalFull                                                        +---------+---------------+---------+-----------+----------+--------------+ PFV      Full                                                        +---------+---------------+---------+-----------+----------+--------------+ POP      Full           Yes      Yes                                 +---------+---------------+---------+-----------+----------+--------------+ PTV      Full                                                        +---------+---------------+---------+-----------+----------+--------------+ PERO     Full                                                        +---------+---------------+---------+-----------+----------+--------------+   +---------+---------------+---------+-----------+----------+--------------+ LEFT     CompressibilityPhasicitySpontaneityPropertiesThrombus Aging +---------+---------------+---------+-----------+----------+--------------+ CFV      Full           Yes      Yes                                 +---------+---------------+---------+-----------+----------+--------------+ SFJ      Full                                                        +---------+---------------+---------+-----------+----------+--------------+  FV Prox  Full                                                        +---------+---------------+---------+-----------+----------+--------------+ FV Mid   Full                                                        +---------+---------------+---------+-----------+----------+--------------+ FV DistalFull                                                         +---------+---------------+---------+-----------+----------+--------------+ PFV      Full                                                        +---------+---------------+---------+-----------+----------+--------------+ POP      Full           Yes      Yes                                 +---------+---------------+---------+-----------+----------+--------------+ PTV      Full                                                        +---------+---------------+---------+-----------+----------+--------------+ PERO     Full                                                        +---------+---------------+---------+-----------+----------+--------------+     Summary: Right: There is no evidence of deep vein thrombosis in the lower extremity. No cystic structure found in the popliteal fossa. Left: There is no evidence of deep vein thrombosis in the lower extremity. No cystic structure found in the popliteal fossa.  *See table(s) above for measurements and observations. Electronically signed by Monica Martinez MD on 01/19/2019 at 5:43:59 PM.    Final    VAS [name] UPPER EXTREMITY VENOUS DUPLEX  Result Date: 01/19/2019 UPPER VENOUS STUDY  Indications: Edema Risk Factors: COVID 19 positive. Limitations: Poor ultrasound/tissue interface, body habitus, patient positioning, patient immobility, patient contractures, bandages and line. Comparison Study: No prior studies. Performing Technologist: Oliver Hum RVT  Examination Guidelines: A complete evaluation includes B-mode imaging, spectral Doppler, color Doppler, and power Doppler as needed of all accessible portions of each vessel. Bilateral testing is considered an integral part of a complete examination. Limited examinations for reoccurring indications may be performed as noted.  Right Findings: +----------+------------+---------+-----------+----------+--------------+ RIGHT     CompressiblePhasicitySpontaneousProperties  Summary      +----------+------------+---------+-----------+----------+--------------+ IJV           Full       Yes       Yes                             +----------+------------+---------+-----------+----------+--------------+ Subclavian    Full       Yes       Yes                             +----------+------------+---------+-----------+----------+--------------+ Axillary      Full       Yes       Yes                             +----------+------------+---------+-----------+----------+--------------+ Brachial                                            Not visualized +----------+------------+---------+-----------+----------+--------------+ Radial        Full                                                 +----------+------------+---------+-----------+----------+--------------+ Ulnar         Full                                                 +----------+------------+---------+-----------+----------+--------------+ Cephalic      Full                                                 +----------+------------+---------+-----------+----------+--------------+ Basilic       Full                                                 +----------+------------+---------+-----------+----------+--------------+  Left Findings: +----------+------------+---------+-----------+----------+-------+ LEFT      CompressiblePhasicitySpontaneousPropertiesSummary +----------+------------+---------+-----------+----------+-------+ IJV           Full       Yes       Yes                      +----------+------------+---------+-----------+----------+-------+ Subclavian    Full       Yes       Yes                      +----------+------------+---------+-----------+----------+-------+ Axillary      Full       Yes       Yes                      +----------+------------+---------+-----------+----------+-------+ Brachial      Full       Yes  Yes                       +----------+------------+---------+-----------+----------+-------+ Radial        Full                                          +----------+------------+---------+-----------+----------+-------+ Ulnar         Full                                          +----------+------------+---------+-----------+----------+-------+ Cephalic      Full                                          +----------+------------+---------+-----------+----------+-------+ Basilic       Full                                          +----------+------------+---------+-----------+----------+-------+  Summary:  Right: No evidence of deep vein thrombosis in the upper extremity. No evidence of superficial vein thrombosis in the upper extremity.  Left: No evidence of deep vein thrombosis in the upper extremity. No evidence of superficial vein thrombosis in the upper extremity.  *See table(s) above for measurements and observations.  Diagnosing physician: Monica Martinez MD Electronically signed by Monica Martinez MD on 01/19/2019 at 5:43:05 PM.    Final    US Abdomen Limited RUQ  Result Date: 01/19/2019 CLINICAL DATA:  Elevated alkaline phosphatase EXAM: ULTRASOUND ABDOMEN LIMITED RIGHT UPPER QUADRANT COMPARISON:  Ultrasound 01/15/2019.  Chest CT 01/17/2019 FINDINGS: Gallbladder: Marked gallbladder wall thickening measuring up to 8 mm, worsening since prior study. Small mobile gallstones, the largest 7 mm. Common bile duct: Diameter: Normal caliber, 4 mm Liver: No focal lesion identified. Within normal limits in parenchymal echogenicity. Portal vein is patent on color Doppler imaging with normal direction of blood flow towards the liver. Other: Small amount of perihepatic ascites. IMPRESSION: Worsening gallbladder wall thickening, now measuring up to 8 mm in thickness. There are small gallstones. Negative sonographic Murphy's sign. If further evaluation is felt warranted, nuclear medicine hepatobiliary scan may be  helpful to assess for cystic duct patency. Small amount of perihepatic ascites. Electronically Signed   By: Rolm Baptise M.D.   On: 01/19/2019 08:41        Scheduled Meds: . chlorhexidine  15 mL Mouth Rinse BID  . Chlorhexidine Gluconate Cloth  6 each Topical Daily  . gabapentin  200 mg Per Tube Q8H  . guaiFENesin  15 mL Per Tube Q6H  . heparin injection (subcutaneous)  5,000 Units Subcutaneous Q8H  . hydrocortisone sod succinate (SOLU-CORTEF) inj  50 mg Intravenous Q8H  . insulin aspart  0-24 Units Subcutaneous Q4H  . mouth rinse  15 mL Mouth Rinse q12n4p  . midodrine  10 mg Per Tube TID WC  . mirabegron ER  50 mg Oral Daily  . multivitamin  15 mL Per Tube Daily  . mupirocin ointment  1 application Nasal BID  . nutrition supplement (JUVEN)  1 packet Per Tube BID BM  . pantoprazole sodium  40 mg Per  Tube Daily  . ramelteon  8 mg Oral QHS  . sodium chloride flush  10-40 mL Intracatheter Q12H  . vancomycin variable dose per unstable renal function (pharmacist dosing)   Does not apply See admin instructions   Continuous Infusions: . sodium chloride    . sodium chloride    . ceFEPime (MAXIPIME) IV 2 g (01/20/19 0700)  . feeding supplement (VITAL AF 1.2 CAL) Stopped (01/20/19 0700)  . lactated ringers Stopped (01/19/19 2003)     LOS: 8 days    Time spent: 35 minutes.     Elmarie Shiley, MD [facility]   If 7PM-7AM, please contact night-coverage [email or web address] Password [email or web address] 01/20/2019, 11:36 AM

## 2019-01-20 NOTE — Progress Notes (Signed)
Critical ABG results given to Dr. Tyrell Antonio. No new orders received at this time. RT will continue to monitor.

## 2019-01-20 NOTE — Progress Notes (Addendum)
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  Incidentally found to be Covid positive.  She is chronically on 3 L of oxygen through her tracheostomy.  She is not vent dependent.  Has a bunch of issues: (1) Anemia, unexplained, AC stopped until yesterday, transfused 1 unit (2) Osteomyelitis of hips/sacrum- on empiric abx, ortho does not want to bone biopsy, duration and narrowing TBD (3) Worsening hypoxemia- related to covid and possibly aspiration (4) Malpositioned suprapubic catheter- replaced by urology, no evidence of leak on CT (5) UTI vs. colonization (6) Anxiety/insomnia requiring intermittent benzodiazepines (7) Elevated Alk phos related to bony involvement/osteo (8) acute on chronic hypotension  PCCM consulted as patient deteriorated further from respiratory standpoint requiring suctioning to bring back up saturations.   Past Medical History    has a past medical history of Acute on chronic respiratory failure with hypoxia ([place]), Anasarca (06/10/2016), Asthma, At high risk for severe sepsis, Cocaine use (10/08/2013), Decubitus ulcer of buttock, unstageable ([place]), Depression, Diabetes mellitus without complication ([place]), GERD (gastroesophageal reflux disease), HCAP (healthcare-associated pneumonia) (06/09/2016), Hepatitis, Kidney stone, Lobar pneumonia ([place]), Overdose of opiate or related narcotic ([place]) (09/02/2014), Pacemaker, Pleurisy, Polysubstance dependence including opioid type drug, episodic abuse ([place]) (10/27/2013), Protein calorie malnutrition ([place]) (10/31/2015), Quadriparesis ([place]), Quadriplegia and quadriparesis ([place]), Quadriplegia, C5-C7 incomplete ([place]) (10/08/2013), Stage IV pressure ulcer of sacral region ([place]) (10/31/2015), and  Tracheostomy status ([place]).   has a past surgical history that includes Appendectomy; Cardiac surgery; I & D extremity (10/28/2011); ir generic historical (11/23/2015); Back surgery (10/08/2013); Tracheostomy; Gastrostomy (11/23/2015); Insertion of suprapubic catheter (N/A, 07/28/2016); Cystoscopy w/ ureteral stent placement (Right, 03/09/2017); Cystoscopy/ureteroscopy/holmium laser/stent placement (Right, 07/16/2017); Cystoscopy w/ ureteral stent removal (Right, 07/16/2017); Lithotripsy (08/2017); IR PATIENT EVAL TECH 0-60 MINS (12/18/2017); and IR REPLACE G-TUBE SIMPLE WO FLUORO (12/19/2017).   Significant Hospital Events   01/09/2019 -admit 12/28 - CTA - No PE 12/29 - ccm consul;t, limited code and icu transfer. . On iv heparin gtt for suspected PE  12/30 -  mag replaced. On IV heparin gtt. On o2 via trach at 4L [place]. On bair hugger. Bedsidde RN reports that 40 min aftrer transfer here patient has been fine and not needed vent since yesterday. Was on levophed few houirs yesterday but improved after midodrine yesterday. Currently sb p 92/map 65. WAs On scheduled albumin Q6h since yesterday -> I v heparin gtt stopped   Consults:  ID, urology, wound care  Procedures:  12/24 SPC replaced  Significant Diagnostic Tests:  CXR 12/29 IMPRESSION: Small left pleural effusion and basilar airspace disease have worsened since the most recent plain film. Small right pleural effusion and basilar airspace disease appear unchanged.  CT Chest 12/28 IMPRESSION: 1. No evidence of a pulmonary embolism. 2. Suspect pulmonary edema, likely on the basis of congestive heart failure, reflected by interstitial thickening, small bilateral effusions and patchy areas of ground-glass opacity. Infection should be considered if there are consistent clinical findings. 3. There is additional dependent opacity in the lower lobes consistent with atelectasis. 4. Two vessel coronary artery calcifications. Minor  aortic atherosclerosis.  Micro Data:  12/23 - urine - proteus 12/23 - covid + 122/28 - MRSA PCR positive     Antimicrobials:  Vancomycin 12/23 >>  12/30 Cefepime 12/23 (proteus) uti >> Remdesivir 12/23 -  12/27 Doxycycline 12/27 >> 12/29      Interim history/subjective:    01/20/2019 - recallled by [facility] after MD noticed lethargy and tachypnea . BUN up at 103 and creat jumped. Has worsening metab and resp acidosis with ph 7.08 . dUplex LE negativ 01/19/2019   Objective   Blood pressure 94/64, pulse 93, temperature 97.8 F (36.6 C), temperature source Oral, resp. rate (!) 31, weight 46.8 kg, last menstrual period 05/27/2016, SpO2 97 %.        Intake/Output Summary (Last 24 hours) at 01/20/2019 1252 Last data filed at 01/20/2019 1008 Gross per 24 hour  Intake 1424 ml  Output 140 ml  Net 1284 ml   Filed Weights   01/04/2019 1704 01/20/19 0433  Weight: 46.5 kg 46.8 kg    General Appearance:  Less awake, lethargic, frail Head:  Normocephalic, without obvious abnormality, atraumatic Eyes:  PERRL - uyes, conjunctiva/corneas - muddy     Ears:  Normal external ear canals, both ears Nose:  G tube - no Throat:  ETT TUBE - no , OG tube - no. TRACH + Neck:  Supple,  No enlargement/tenderness/nodules Lungs: Clear to auscultation bilaterally, trachupneic - mild, paradoxical  Heart:  S1 and S2 normal, no murmur, CVP - x.  Pressors - no Abdomen:  Soft, no masses, no organomegaly Genitalia / Rectal:  Not done Extremities:  Extremities- frail, contracted, poor muscle mass Skin:  ntact in exposed areas . Sacral area - decub per his Neurologic:  Sedation - none -> RASS - -3         Resolved Hospital Problem list   N/A  Assessment & Plan:  # Acute on chronic hypoxemic respiratory failure in setting of multiple comorbidities and encephalopathy.  Differential here is worsening Covid infection, aspiration, inability to handle secretions.  I doubt she developed a blood clot  between yesterday and today.  She has been started on heparin drip 12./29 (PE was ruled out 1 d earlier on 12/28)   - 01/20/2019 - on O2 via trach.  Res distress with resp and metab acodisis Plan - change to full vent support via trach - dc iv heparin (this is not PE clinically) but get duplex LE   # COVID- infiltrates on CT either viral or aspiration or low oncotic pressure edema - Finished course of dexamethasone and remdesivir  Plan  - monitor   #Acidosis - metabolic and resp  88/11 - worse  Plan  - start vent  - agree wih bic gtt by triad MD  #AKI  12/31/ - worse  Plan   Agree with urology and renal input by [facility]ad   # Chronic vasoplegia- midodrine, stress steroids,   12/30 - finishing up albumin. Off pressors. No change 01/20/2019  P;lan Monitor Goal sbo > 85, MAP > 55 continie midodrine Might benefit from flornief - triad MD to decide  # Acute on chronic anemia- unexplained but likely related to phlebotomy, poor nutrition, critical illness bone marrow dysfunction,  stable after 1 unit  01/20/2019 - no change  Plan  - - PRBC for hgb </= 6.9gm%    - exceptions are   -  if ACS susepcted/confirmed then transfuse for hgb </= 8.0gm%,  or    -  active bleeding with hemodynamic instability, then transfuse regardless of hemoglobin value   At at all times try to transfuse 1 unit prbc as possible with exception of active hemorrhage     # Acute on chronic metabolic encephalopathy, myriad of reasons  for this.  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  Bone biopsy not possible at this time due to Covid status and fraility. 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  Plan  - gabapentin for chronic pain baeline med - wound care for sacral decub  # Elevated ammonia- probably related to muscle breakdown  Plan  - on lactulose but  Will dc due to diarrhea  # Elevated GGT, abnormal CT appearance of gallbladder - RUQ [name] with gall bladder thickening  Plan  - triad calling GT  # PEG in place- sTF, monitor for refeeding    # Multiorgan failure approaching end of life- palliative following, NO CPR But full medical care +     Best practice:  Diet: TF Pain/Anxiety/Delirium protocol (if indicated): Hold given encephalopathy VAP protocol (if indicated): Not needed right now DVT prophylaxis: heparin gtt -> change to heparin sq GI prophylaxis: protonix Glucose control: SSI Mobility: BR Code Status: partial code Family Communication: triad to updated family Disposition: ICU/progressive. [facility] primary and CCM consult for vent     ATTESTATION & SIGNATURE     Dr. Brand Males, M.D., [facility].C.P Pulmonary and Critical Care Medicine Staff Physician [facility] Pager: [phone], If no answer or between  15:00h - 7:00h: call [phone]  01/20/2019 12:52 PM     LABS    PULMONARY Recent Labs  Lab 01/18/19 2155 01/20/19 1210  PHART 7.236* 7.083*  PCO2ART 65.0* 52.4*  PO2ART 73.0* 67.0*  HCO3 27.6 15.6*  TCO2 30 17*  O2SAT 91.0 84.0    CBC Recent Labs  Lab 01/18/19 0323 01/19/19 0417 01/20/19 0500 01/20/19 1210  HGB 8.6* 8.3* 9.0* 10.9*  HCT 29.0* 28.6* 31.0* 32.0*  WBC 9.3 11.8* 20.5*  --   PLT 448* 583* 741*  --     COAGULATION No results for input(s): INR in the last 168 hours.  CARDIAC  No results for input(s): TROPONINI in the last 168 hours. No  results for input(s): PROBNP in the last 168 hours.   CHEMISTRY Recent Labs  Lab 01/13/19 1900 01/16/19 0310 01/17/19 0428 01/18/19 0323 01/18/19 1322 01/18/19 1844 01/18/19 2155 01/19/19 0417 01/19/19 1821 01/20/19 0500 01/20/19 1210  NA 134* 135 134* 135  --   --  148*  --  138 140 143  K 4.3 3.3* 3.6 3.2*  --   --  4.6  --  4.2 5.0 3.8  CL 108 111 109 111  --   --   --   --  115* 115*  --   CO2 16* 18* 18* 17*  --   --   --   --  16* 16*  --   GLUCOSE 58* 153* 129* 142*  --   --   --   --  201* 120*  --   BUN 19 20 26* 51*  --   --   --   --  87* 103*  --  CREATININE 0.75 0.62 0.57 0.53  --   --   --   --  0.94 1.04*  --   CALCIUM 8.3* 8.1* 8.4* 8.7*  --   --   --   --  9.2 7.9*  --   MG 1.7  --   --   --  1.5* 1.4*  --  1.6* 2.2  --   --   PHOS  --   --   --   --  3.0 2.5  --  2.8 2.9  --   --    Estimated Creatinine Clearance: 48.9 mL/min (A) (by C-G formula based on SCr of 1.04 mg/dL (H)).   LIVER Recent Labs  Lab 01/16/19 0310 01/17/19 0428 01/18/19 0323 01/19/19 1821 01/20/19 0500  AST 36 46* 39 39 42*  ALT 24 32 '28 25 28  '$ ALKPHOS 957* 957* 825* 615* 632*  BILITOT 1.0 1.1 1.3* 1.2 1.2  PROT 5.9* 6.2* 6.2* 6.4* 6.3*  ALBUMIN 1.4* 1.4* 1.8* 3.0* 2.6*     INFECTIOUS No results for input(s): LATICACIDVEN, PROCALCITON in the last 168 hours.   ENDOCRINE CBG (last 3)  Recent Labs    01/20/19 0441 01/20/19 0815 01/20/19 1115  GLUCAP 102* 154* 142*         IMAGING x48h  - image(s) personally visualized  -   highlighted in bold DG Abd 1 View  Result Date: 01/19/2019 CLINICAL DATA:  48 year old female with history of suprapubic catheter. EXAM: ABDOMEN - 1 VIEW COMPARISON:  CT the abdomen and pelvis 01/14/2019. FINDINGS: No definite suprapubic catheter identified. Radiopaque structure projecting over the low pelvis may either represent a collection bag associated with an open stoma, or a rectal bag. Percutaneous gastrostomy tube projecting over the  epigastric region. Gas is noted in the colon. Paucity of rectal gas. No pathologic dilatation of small bowel. No gross pneumoperitoneum noted on today's single supine view. IMPRESSION: 1. Support apparatus, as above. 2. Nonobstructive bowel gas pattern. Electronically Signed   By: Vinnie Langton M.D.   On: 01/19/2019 20:16   VAS [name] LOWER EXTREMITY VENOUS (DVT)  Result Date: 01/19/2019  Lower Venous Study Indications: Hypoxia.  Risk Factors: COVID 19 positive. Limitations: Body habitus, poor ultrasound/tissue interface, bandages and patient positioning, patient immobility, patient contractures. Comparison Study: No prior studies. Performing Technologist: Oliver Hum RVT  Examination Guidelines: A complete evaluation includes B-mode imaging, spectral Doppler, color Doppler, and power Doppler as needed of all accessible portions of each vessel. Bilateral testing is considered an integral part of a complete examination. Limited examinations for reoccurring indications may be performed as noted.  +---------+---------------+---------+-----------+----------+--------------+ RIGHT    CompressibilityPhasicitySpontaneityPropertiesThrombus Aging +---------+---------------+---------+-----------+----------+--------------+ CFV      Full           Yes      Yes                                 +---------+---------------+---------+-----------+----------+--------------+ SFJ      Full                                                        +---------+---------------+---------+-----------+----------+--------------+ FV Prox  Full                                                        +---------+---------------+---------+-----------+----------+--------------+  FV Mid   Full                                                        +---------+---------------+---------+-----------+----------+--------------+ FV DistalFull                                                         +---------+---------------+---------+-----------+----------+--------------+ PFV      Full                                                        +---------+---------------+---------+-----------+----------+--------------+ POP      Full           Yes      Yes                                 +---------+---------------+---------+-----------+----------+--------------+ PTV      Full                                                        +---------+---------------+---------+-----------+----------+--------------+ PERO     Full                                                        +---------+---------------+---------+-----------+----------+--------------+   +---------+---------------+---------+-----------+----------+--------------+ LEFT     CompressibilityPhasicitySpontaneityPropertiesThrombus Aging +---------+---------------+---------+-----------+----------+--------------+ CFV      Full           Yes      Yes                                 +---------+---------------+---------+-----------+----------+--------------+ SFJ      Full                                                        +---------+---------------+---------+-----------+----------+--------------+ FV Prox  Full                                                        +---------+---------------+---------+-----------+----------+--------------+ FV Mid   Full                                                        +---------+---------------+---------+-----------+----------+--------------+  FV DistalFull                                                        +---------+---------------+---------+-----------+----------+--------------+ PFV      Full                                                        +---------+---------------+---------+-----------+----------+--------------+ POP      Full           Yes      Yes                                  +---------+---------------+---------+-----------+----------+--------------+ PTV      Full                                                        +---------+---------------+---------+-----------+----------+--------------+ PERO     Full                                                        +---------+---------------+---------+-----------+----------+--------------+     Summary: Right: There is no evidence of deep vein thrombosis in the lower extremity. No cystic structure found in the popliteal fossa. Left: There is no evidence of deep vein thrombosis in the lower extremity. No cystic structure found in the popliteal fossa.  *See table(s) above for measurements and observations. Electronically signed by Monica Martinez MD on 01/19/2019 at 5:43:59 PM.    Final    VAS [name] UPPER EXTREMITY VENOUS DUPLEX  Result Date: 01/19/2019 UPPER VENOUS STUDY  Indications: Edema Risk Factors: COVID 19 positive. Limitations: Poor ultrasound/tissue interface, body habitus, patient positioning, patient immobility, patient contractures, bandages and line. Comparison Study: No prior studies. Performing Technologist: Oliver Hum RVT  Examination Guidelines: A complete evaluation includes B-mode imaging, spectral Doppler, color Doppler, and power Doppler as needed of all accessible portions of each vessel. Bilateral testing is considered an integral part of a complete examination. Limited examinations for reoccurring indications may be performed as noted.  Right Findings: +----------+------------+---------+-----------+----------+--------------+ RIGHT     CompressiblePhasicitySpontaneousProperties   Summary     +----------+------------+---------+-----------+----------+--------------+ IJV           Full       Yes       Yes                             +----------+------------+---------+-----------+----------+--------------+ Subclavian    Full       Yes       Yes                              +----------+------------+---------+-----------+----------+--------------+ Axillary      Full  Yes       Yes                             +----------+------------+---------+-----------+----------+--------------+ Brachial                                            Not visualized +----------+------------+---------+-----------+----------+--------------+ Radial        Full                                                 +----------+------------+---------+-----------+----------+--------------+ Ulnar         Full                                                 +----------+------------+---------+-----------+----------+--------------+ Cephalic      Full                                                 +----------+------------+---------+-----------+----------+--------------+ Basilic       Full                                                 +----------+------------+---------+-----------+----------+--------------+  Left Findings: +----------+------------+---------+-----------+----------+-------+ LEFT      CompressiblePhasicitySpontaneousPropertiesSummary +----------+------------+---------+-----------+----------+-------+ IJV           Full       Yes       Yes                      +----------+------------+---------+-----------+----------+-------+ Subclavian    Full       Yes       Yes                      +----------+------------+---------+-----------+----------+-------+ Axillary      Full       Yes       Yes                      +----------+------------+---------+-----------+----------+-------+ Brachial      Full       Yes       Yes                      +----------+------------+---------+-----------+----------+-------+ Radial        Full                                          +----------+------------+---------+-----------+----------+-------+ Ulnar         Full                                           +----------+------------+---------+-----------+----------+-------+  Cephalic      Full                                          +----------+------------+---------+-----------+----------+-------+ Basilic       Full                                          +----------+------------+---------+-----------+----------+-------+  Summary:  Right: No evidence of deep vein thrombosis in the upper extremity. No evidence of superficial vein thrombosis in the upper extremity.  Left: No evidence of deep vein thrombosis in the upper extremity. No evidence of superficial vein thrombosis in the upper extremity.  *See table(s) above for measurements and observations.  Diagnosing physician: Monica Martinez MD Electronically signed by Monica Martinez MD on 01/19/2019 at 5:43:05 PM.    Final    US Abdomen Limited RUQ  Result Date: 01/19/2019 CLINICAL DATA:  Elevated alkaline phosphatase EXAM: ULTRASOUND ABDOMEN LIMITED RIGHT UPPER QUADRANT COMPARISON:  Ultrasound 01/15/2019.  Chest CT 01/17/2019 FINDINGS: Gallbladder: Marked gallbladder wall thickening measuring up to 8 mm, worsening since prior study. Small mobile gallstones, the largest 7 mm. Common bile duct: Diameter: Normal caliber, 4 mm Liver: No focal lesion identified. Within normal limits in parenchymal echogenicity. Portal vein is patent on color Doppler imaging with normal direction of blood flow towards the liver. Other: Small amount of perihepatic ascites. IMPRESSION: Worsening gallbladder wall thickening, now measuring up to 8 mm in thickness. There are small gallstones. Negative sonographic Murphy's sign. If further evaluation is felt warranted, nuclear medicine hepatobiliary scan may be helpful to assess for cystic duct patency. Small amount of perihepatic ascites. Electronically Signed   By: Rolm Baptise M.D.   On: 01/19/2019 08:41

## 2019-01-21 DIAGNOSIS — J9620 Acute and chronic respiratory failure, unspecified whether with hypoxia or hypercapnia: Secondary | ICD-10-CM

## 2019-01-21 DIAGNOSIS — R579 Shock, unspecified: Secondary | ICD-10-CM

## 2019-01-21 LAB — CBC
HCT: 27.7 % — ABNORMAL LOW (ref 36.0–46.0)
Hemoglobin: 8.2 g/dL — ABNORMAL LOW (ref 12.0–15.0)
MCH: 25.8 pg — ABNORMAL LOW (ref 26.0–34.0)
MCHC: 29.6 g/dL — ABNORMAL LOW (ref 30.0–36.0)
MCV: 87.1 fL (ref 80.0–100.0)
Platelets: 509 10*3/uL — ABNORMAL HIGH (ref 150–400)
RBC: 3.18 MIL/uL — ABNORMAL LOW (ref 3.87–5.11)
RDW: 25.1 % — ABNORMAL HIGH (ref 11.5–15.5)
WBC: 16.8 10*3/uL — ABNORMAL HIGH (ref 4.0–10.5)
nRBC: 0.5 % — ABNORMAL HIGH (ref 0.0–0.2)

## 2019-01-21 LAB — COMPREHENSIVE METABOLIC PANEL
ALT: 26 U/L (ref 0–44)
AST: 44 U/L — ABNORMAL HIGH (ref 15–41)
Albumin: 1.8 g/dL — ABNORMAL LOW (ref 3.5–5.0)
Alkaline Phosphatase: 1527 U/L — ABNORMAL HIGH (ref 38–126)
Anion gap: 9 (ref 5–15)
BUN: 98 mg/dL — ABNORMAL HIGH (ref 6–20)
CO2: 21 mmol/L — ABNORMAL LOW (ref 22–32)
Calcium: 8.2 mg/dL — ABNORMAL LOW (ref 8.9–10.3)
Chloride: 109 mmol/L (ref 98–111)
Creatinine, Ser: 1.19 mg/dL — ABNORMAL HIGH (ref 0.44–1.00)
GFR calc Af Amer: >60 mL/min (ref >60)
GFR calc non Af Amer: 54 mL/min — ABNORMAL LOW (ref >60)
Glucose, Bld: 359 mg/dL — ABNORMAL HIGH (ref 70–99)
Potassium: 2.8 mmol/L — ABNORMAL LOW (ref 3.5–5.1)
Sodium: 139 mmol/L (ref 135–145)
Total Bilirubin: 1.7 mg/dL — ABNORMAL HIGH (ref 0.3–1.2)
Total Protein: 5.3 g/dL — ABNORMAL LOW (ref 6.5–8.1)

## 2019-01-21 LAB — GLUCOSE, CAPILLARY
Glucose-Capillary: 203 mg/dL — ABNORMAL HIGH (ref 70–99)
Glucose-Capillary: 187 mg/dL — ABNORMAL HIGH (ref 70–99)
Glucose-Capillary: 333 mg/dL — ABNORMAL HIGH (ref 70–99)
Glucose-Capillary: 314 mg/dL — ABNORMAL HIGH (ref 70–99)
Glucose-Capillary: 346 mg/dL — ABNORMAL HIGH (ref 70–99)
Glucose-Capillary: 417 mg/dL — ABNORMAL HIGH (ref 70–99)

## 2019-01-21 LAB — PROCALCITONIN: Procalcitonin: 0.3 ng/mL

## 2019-01-21 LAB — CREATININE, SERUM
Creatinine, Ser: 1.21 mg/dL — ABNORMAL HIGH (ref 0.44–1.00)
GFR calc Af Amer: >60 mL/min (ref >60)
GFR calc non Af Amer: 53 mL/min — ABNORMAL LOW (ref >60)

## 2019-01-21 LAB — AMMONIA: Ammonia: 47 umol/L — ABNORMAL HIGH (ref 9–35)

## 2019-01-21 MED ORDER — POTASSIUM CHLORIDE 20 MEQ/15ML (10%) PO SOLN
40.0000 meq | Freq: Once | ORAL | Status: AC
  Administered 2019-01-21: 40 meq
  Filled 2019-01-21: qty 30

## 2019-01-21 MED ORDER — LACTATED RINGERS IV BOLUS
1000.0000 mL | Freq: Once | INTRAVENOUS | Status: AC
  Administered 2019-01-21: 1000 mL via INTRAVENOUS

## 2019-01-21 MED ORDER — CHLORHEXIDINE GLUCONATE 0.12% ORAL RINSE (MEDLINE KIT)
15.0000 mL | Freq: Two times a day (BID) | OROMUCOSAL | Status: DC
  Administered 2019-01-21 – 2019-01-23 (×5): 15 mL via OROMUCOSAL

## 2019-01-21 MED ORDER — ORAL CARE MOUTH RINSE
15.0000 mL | OROMUCOSAL | Status: DC
  Administered 2019-01-21 – 2019-02-05 (×150): 15 mL via OROMUCOSAL

## 2019-01-21 MED ORDER — INSULIN DETEMIR 100 UNIT/ML ~~LOC~~ SOLN
5.0000 [IU] | Freq: Every day | SUBCUTANEOUS | Status: DC
  Administered 2019-01-21 – 2019-01-22 (×2): 5 [IU] via SUBCUTANEOUS
  Filled 2019-01-21 (×2): qty 0.05

## 2019-01-21 MED ORDER — POTASSIUM CHLORIDE 10 MEQ/50ML IV SOLN
10.0000 meq | INTRAVENOUS | Status: AC
  Administered 2019-01-21 (×2): 10 meq via INTRAVENOUS
  Filled 2019-01-21 (×2): qty 50

## 2019-01-21 NOTE — Progress Notes (Signed)
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  Incidentally found to be Covid positive.  She is chronically on 3 L of oxygen through her tracheostomy.  She is not vent dependent.  Has a bunch of issues: (1) Anemia, unexplained, AC stopped until yesterday, transfused 1 unit (2) Osteomyelitis of hips/sacrum- on empiric abx, ortho does not want to bone biopsy, duration and narrowing TBD (3) Worsening hypoxemia- related to covid and possibly aspiration (4) Malpositioned suprapubic catheter- replaced by urology, no evidence of leak on CT (5) UTI vs. colonization (6) Anxiety/insomnia requiring intermittent benzodiazepines (7) Elevated Alk phos related to bony involvement/osteo (8) acute on chronic hypotension  PCCM consulted as patient deteriorated further from respiratory standpoint requiring suctioning to bring back up saturations.   Past Medical History    has a past medical history of Acute on chronic respiratory failure with hypoxia ([place]), Anasarca (06/10/2016), Asthma, At high risk for severe sepsis, Cocaine use (10/08/2013), Decubitus ulcer of buttock, unstageable ([place]), Depression, Diabetes mellitus without complication ([place]), GERD (gastroesophageal reflux disease), HCAP (healthcare-associated pneumonia) (06/09/2016), Hepatitis, Kidney stone, Lobar pneumonia ([place]), Overdose of opiate or related narcotic ([place]) (09/02/2014), Pacemaker, Pleurisy, Polysubstance dependence including opioid type drug, episodic abuse ([place]) (10/27/2013), Protein calorie malnutrition ([place]) (10/31/2015), Quadriparesis ([place]), Quadriplegia and quadriparesis ([place]), Quadriplegia, C5-C7 incomplete ([place]) (10/08/2013), Stage IV pressure ulcer of sacral region ([place]) (10/31/2015), and  Tracheostomy status ([place]).   has a past surgical history that includes Appendectomy; Cardiac surgery; I & D extremity (10/28/2011); ir generic historical (11/23/2015); Back surgery (10/08/2013); Tracheostomy; Gastrostomy (11/23/2015); Insertion of suprapubic catheter (N/A, 07/28/2016); Cystoscopy w/ ureteral stent placement (Right, 03/09/2017); Cystoscopy/ureteroscopy/holmium laser/stent placement (Right, 07/16/2017); Cystoscopy w/ ureteral stent removal (Right, 07/16/2017); Lithotripsy (08/2017); IR PATIENT EVAL TECH 0-60 MINS (12/18/2017); and IR REPLACE G-TUBE SIMPLE WO FLUORO (12/19/2017).   Significant Hospital Events   12/23/2018 -admit 12/28 - CTA - No PE 12/29 - ccm consul;t, limited code and icu transfer. . On iv heparin gtt for suspected PE  12/30 -  mag replaced. On IV heparin gtt. On o2 via trach at 4L [place]. On bair hugger. Bedsidde RN reports that 40 min aftrer transfer here patient has been fine and not needed vent since yesterday. Was on levophed few houirs yesterday but improved after midodrine yesterday. Currently sb p 92/map 65. WAs On scheduled albumin Q6h since yesterday -> I v heparin gtt stopped  12/.31 -  recallled by triad after MD noticed lethargy and tachypnea . BUN up at 103 and creat jumped. Has worsening metab and resp acidosis with ph 7.08 . dUplex LE negativ 01/19/2019- > back on vent. Renal consult: AKI due to CTA and vanc. Recommend med mgmt only. Poor CRRT/HD candidate per renal    Consults:  ID, urology, wound care  Procedures:  12/24 SPC replaced  Significant Diagnostic Tests:  CXR 12/29 IMPRESSION: Small left pleural effusion and basilar airspace disease have worsened since the most recent plain film. Small right pleural effusion and basilar airspace disease appear unchanged.  CT Chest 12/28 IMPRESSION: 1. No evidence of a pulmonary embolism. 2. Suspect pulmonary edema, likely on the basis of congestive heart failure, reflected by interstitial thickening,  small bilateral effusions and patchy areas of ground-glass opacity. Infection should be considered if there are consistent clinical findings. 3.  There is additional dependent opacity in the lower lobes consistent with atelectasis. 4. Two vessel coronary artery calcifications. Minor aortic atherosclerosis.  Micro Data:  12/23 - urine - proteus 12/23 - covid + 122/28 - MRSA PCR positive     Antimicrobials:  Vancomycin 12/23 >>  12/30 Cefepime 12/23 (proteus) uti >> Remdesivir 12/23 - 12/27 Doxycycline 12/27 >> 12/29 Linezolid 12/31 >>  Anti-infectives (From admission, onward)   Start     Dose/Rate Route Frequency Ordered Stop   01/20/19 2200  linezolid (ZYVOX) IVPB 600 mg     600 mg 300 mL/hr over 60 Minutes Intravenous Every 12 hours 01/20/19 1242     01/20/19 0534  ceFEPIme (MAXIPIME) 2 g in sodium chloride 0.9 % 100 mL IVPB     2 g 200 mL/hr over 30 Minutes Intravenous Every 12 hours 01/19/19 2028     01/19/19 1628  vancomycin variable dose per unstable renal function (pharmacist dosing)  Status:  Discontinued      Does not apply See admin instructions 01/19/19 1628 01/20/19 1242   01/16/19 2200  doxycycline (VIBRA-TABS) tablet 100 mg  Status:  Discontinued     100 mg Oral Every 12 hours 01/16/19 1703 01/18/19 1138   01/13/19 1000  remdesivir 100 mg in sodium chloride 0.9 % 100 mL IVPB     100 mg 200 mL/hr over 30 Minutes Intravenous Daily 01/18/2019 2243 01/16/19 0902   01/13/19 0200  ceFEPIme (MAXIPIME) 2 g in sodium chloride 0.9 % 100 mL IVPB  Status:  Discontinued     2 g 200 mL/hr over 30 Minutes Intravenous Every 8 hours 01/02/2019 1831 01/19/19 2028   01/03/2019 2245  remdesivir 200 mg in sodium chloride 0.9% 250 mL IVPB     200 mg 580 mL/hr over 30 Minutes Intravenous Once 01/19/2019 2243 01/13/19 0210   01/18/2019 1830  vancomycin (VANCOCIN) IVPB 1000 mg/200 mL premix  Status:  Discontinued     1,000 mg 200 mL/hr over 60 Minutes Intravenous Every 24 hours 01/11/2019 1831  01/19/19 0029   12/28/2018 1730  vancomycin (VANCOCIN) IVPB 1000 mg/200 mL premix  Status:  Discontinued     1,000 mg 200 mL/hr over 60 Minutes Intravenous  Once 01/02/2019 1717 01/11/2019 1831   01/11/2019 1715  ceFEPIme (MAXIPIME) 2 g in sodium chloride 0.9 % 100 mL IVPB     2 g 200 mL/hr over 30 Minutes Intravenous  Once 12/27/2018 1713 01/15/2019 1837         Interim history/subjective:    01/21/2019 - on neo gtt and bic gtt d5water at 100cc/h and back on vent with 50% fio2. Has 4 cuffed Shiley trach. Making urine but output hard to measure due to suprapubic cath. EDema + per RN. Does not communicate much. Creat worse today. On TF  Objective   Blood pressure 99/68, pulse 83, temperature 98 F (36.7 C), temperature source Axillary, resp. rate 16, height _0  (1.575 m), weight 46.7 kg, last menstrual period 05/27/2016, SpO2 100 %.    Vent Mode: PRVC FiO2 (%):  [45 %-50 %] 50 % Set Rate:  [16 bmp] 16 bmp Vt Set:  [400 mL] 400 mL PEEP:  [5 cmH20] 5 cmH20 Plateau Pressure:  [26 cmH20-28 cmH20] 26 cmH20   Intake/Output Summary (Last 24 hours) at 01/21/2019 0817 Last data filed at 01/21/2019 0600 Gross per 24 hour  Intake 4082.41 ml  Output 240 ml  Net 3842.41 ml   Filed Weights   01/20/2019 1704 01/20/19 0433 01/21/19 0500  Weight: 46.5 kg 46.8 kg 46.7 kg   General Appearance:  Looks frail.  Head:  Normocephalic, without obvious abnormality, atraumatic Eyes:  PERRL - yes, conjunctiva/corneas - muddy     Ears:  Normal external ear canals, both ears Nose:  G tube - no Throat:  ETT TUBE - no , OG tube - no. Trach + 4 shiley Neck:  Supple,  No enlargement/tenderness/nodules Lungs: Clear to auscultation bilaterally, Ventilator   Synchrony - yes Heart:  S1 and S2 normal, no murmur, CVP - x.  Pressors - neo Abdomen:  Soft, no masses, no organomegaly Genitalia / Rectal:  Not done Extremities:  Extremities- contractures Skin:  ntact in exposed areas . Sacral area - decub + per rn Neurologic:   Sedation - none -> RASS - 0 . Moves all 4s - no. CAM-ICU - x . Orientation - x          Resolved Hospital Problem list   N/A  Assessment & Plan:  # Acute on chronic hypoxemic respiratory failure in setting of multiple comorbidities and encephalopathy.  Differential here is worsening Covid infection, aspiration, inability to handle secretions.  I doubt she developed a blood clot between yesterday and today.  She has been started on heparin drip 12./29 (PE was ruled out 1 d earlier on 12/28 and duple LE/UE neg 01/19/2019)   - 01/21/2019 - back on ventilator since 01/20/2019 due to acidosis - metabolic with respiratory poor compensation    Plan - full vent support   # COVID- infiltrates on CT either viral or aspiration or low oncotic pressure edema - Finished course of dexamethasone and remdesivir  Plan  - monitor   #Circulatory shock with background chronic vasoplegia (s/p albumin 12/29-12/30/2020)   01/21/2019 - on neo since yesterday  Plan  - fluid bolus  - MAP goal > 55 and sbop > 90 - neo - midodrine - hydrocort   #Acidosis - metabolic and resp due t o AKI with poor resp compensation due to quad and low muscle mass  12/31 - clinically appears controlled by bic gtt and vent  Plan - see renal and resp section  #AKI -   12/31/ - worse and worse again 01/21/2019. Renal supsects CTA and vanc related  Plan - per renal - bic gtt - poor HD and CRRT candidate   # Suprapubic catheter with initial concern for posterior bladder wall perforation, subsequent CT abdomen pelvis did not show a leak, suprapubic catheter changed on 01/13/2019  Plan  - monitor   # Proteus colonization of suprapubic catheter-   Plan  -- should be covered by cefepime regardless    # Acute on chronic anemia- unexplained but likely related to phlebotomy, poor nutrition, critical illness bone marrow dysfunction,  stable after 1 unit  01/21/2019 - hgb 8gm%. +. No overt bleeding  Plan  -  - PRBC for hgb </= 6.9gm%    - exceptions are   -  if ACS susepcted/confirmed then transfuse for hgb </= 8.0gm%,  or    -  active bleeding with hemodynamic instability, then transfuse regardless of hemoglobin value   At at all times try to transfuse 1 unit prbc as possible with exception of active hemorrhage     # Acute on chronic metabolic encephalopathy, myriad of reasons for this.  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  Bone biopsy not possible at this time due to Covid status and fraility.  Plan  - linezold  - cefepime - wound care  # Severe protein calorie malnutrition present on admission-    Plan  - - continue TF  # Elevated ammonia- probably related to muscle breakdown  Plan - monitor clinically off lactulose  # Elevated GGT, abnormal CT appearance of gallbladder 01/11/2001  - 2. Findings which could be suggestive of acute cholecystitis with hyperenhancement of the gallbladder wall, pericholecystic fluid and pericholecystic inflammatory changes. Inflammatory changes extend around the liver and into the right pericolic gutter. If further evaluation is required, would recommend right upper quadrant ultrasound   Plan  - triad calling GT    # Quadriplegia/SAcral decub related to prior spinal cord injury with sacral decub  Plan  - dc gabapentin for chronic pain baeline med du eto AKI (can restart later if needed) - wound care for sacral decub - do opiopid iv prn if needed    # Multiorgan failure approaching end of life- palliative following, NO CPR But full medical care +     Best practice:  Diet: TF Pain/Anxiety/Delirium protocol (if indicated): Hold given encephalopathy VAP protocol (if indicated): Not needed right now DVT  prophylaxis: heparin gtt -> change to heparin sq GI prophylaxis: protonix Glucose control: SSI Mobility: BR Code Status: partial code - advised mom she is not  A good candidate for CRRt/HD. Mom asked [name] to pray for daughter and says she misses her.   Family Communication: mom on phone. Mom says lasr seen a year ago (2015 quad after fall from deck) Disposition: ICU/progressive.  CCM back primary 01/21/2019   ATTESTATION & SIGNATURE     Dr. Brand Males, M.D., [facility].C.P Pulmonary and Critical Care Medicine Staff Physician [facility] Pager: [phone], If no answer or between  15:00h - 7:00h: call [phone]  01/21/2019 8:17 AM     LABS    PULMONARY Recent Labs  Lab 01/18/19 2155 01/20/19 1210 01/20/19 2043  PHART 7.236* 7.083* 7.219*  PCO2ART 65.0* 52.4* 41.0  PO2ART 73.0* 67.0* 92.0  HCO3 27.6 15.6* 17.0*  TCO2 30 17* 18*  O2SAT 91.0 84.0 96.0    CBC Recent Labs  Lab 01/19/19 0417 01/20/19 0500 01/20/19 1210 01/20/19 2043 01/21/19 0545  HGB 8.3* 9.0* 10.9* 10.5* 8.2*  HCT 28.6* 31.0* 32.0* 31.0* 27.7*  WBC 11.8* 20.5*  --   --  16.8*  PLT 583* 741*  --   --  509*    COAGULATION No results for input(s): INR in the last 168 hours.  CARDIAC  No results for input(s): TROPONINI in the last 168 hours. No results for input(s): PROBNP in the last 168 hours.   CHEMISTRY Recent Labs  Lab 01/16/19 0310 01/17/19 0428 01/18/19 0323 01/18/19 1322 01/18/19 1844 01/18/19 2155 01/19/19 0417 01/19/19 1821 01/20/19 0500 01/20/19 1210 01/20/19 1256 01/20/19 2043 01/21/19 0545  NA 135 134* 135  --   --  148*  --  138 140 143  --  143  --   K 3.3* 3.6 3.2*  --   --  4.6  --  4.2 5.0 3.8  --  3.3*  --   CL 111 109 111  --   --   --   --  115* 115*  --   --   --   --  CO2 18* 18* 17*  --   --   --   --  16* 16*  --   --   --   --   GLUCOSE 153* 129* 142*  --   --   --   --  201* 120*  --   --   --   --     BUN 20 26* 51*  --   --   --   --  87* 103*  --   --   --   --   CREATININE 0.62 0.57 0.53  --   --   --   --  0.94 1.04*  --  1.12*  --  1.21*  CALCIUM 8.1* 8.4* 8.7*  --   --   --   --  9.2 7.9*  --   --   --   --   MG  --   --   --  1.5* 1.4*  --  1.6* 2.2  --   --   --   --   --   PHOS  --   --   --  3.0 2.5  --  2.8 2.9  --   --   --   --   --    Estimated Creatinine Clearance: 41.9 mL/min (A) (by C-G formula based on SCr of 1.21 mg/dL (H)).   LIVER Recent Labs  Lab 01/16/19 0310 01/17/19 0428 01/18/19 0323 01/19/19 1821 01/20/19 0500  AST 36 46* 39 39 42*  ALT 24 32 _0 ALKPHOS 957* 957* 825* 615* 632*  BILITOT 1.0 1.1 1.3* 1.2 1.2  PROT 5.9* 6.2* 6.2* 6.4* 6.3*  ALBUMIN 1.4* 1.4* 1.8* 3.0* 2.6*     INFECTIOUS Recent Labs  Lab 01/20/19 1256  LATICACIDVEN 0.8  PROCALCITON 0.42     ENDOCRINE CBG (last 3)  Recent Labs    01/20/19 2350 01/21/19 0418 01/21/19 0742  GLUCAP 229* 203* 187*         IMAGING x48h  - image(s) personally visualized  -   highlighted in bold DG Abd 1 View  Result Date: 01/19/2019 CLINICAL DATA:  49 year old female with history of suprapubic catheter. EXAM: ABDOMEN - 1 VIEW COMPARISON:  CT the abdomen and pelvis 12/27/2018. FINDINGS: No definite suprapubic catheter identified. Radiopaque structure projecting over the low pelvis may either represent a collection bag associated with an open stoma, or a rectal bag. Percutaneous gastrostomy tube projecting over the epigastric region. Gas is noted in the colon. Paucity of rectal gas. No pathologic dilatation of small bowel. No gross pneumoperitoneum noted on today's single supine view. IMPRESSION: 1. Support apparatus, as above. 2. Nonobstructive bowel gas pattern. Electronically Signed   By: Vinnie Langton M.D.   On: 01/19/2019 20:16   NM Hepatobiliary Liver Func  Result Date: 01/20/2019 CLINICAL DATA:  Possible sepsis. Infected sacral/decubitus ulcers. Quadriplegia. COVID-19.  EXAM: NUCLEAR MEDICINE HEPATOBILIARY IMAGING TECHNIQUE: Sequential images of the abdomen were obtained out to 60 minutes following intravenous administration of radiopharmaceutical. RADIOPHARMACEUTICALS:  5.1 mCi Tc-45m Choletec IV COMPARISON:  Abdominal ultrasound 01/19/2019. FINDINGS: Prompt uptake of tracer by the liver. Gallbladder activity is identified, confirming cystic duct patency. Despite imaging for 2 hours, common duct and bowel activity are not identified. IMPRESSION: 1. No evidence of cystic duct obstruction or acute cholecystitis. 2. Absent common duct or bowel activity, can be seen with biliary obstruction. Of note, no bile duct dilatation is seen on the ultrasound of earlier today. Electronically  Signed   By: Abigail Miyamoto M.D.   On: 01/20/2019 19:50   DG CHEST PORT 1 VIEW  Result Date: 01/20/2019 CLINICAL DATA:  hypoxemia EXAM: PORTABLE CHEST 1 VIEW COMPARISON:  Chest radiograph 01/18/2019 FINDINGS: Limited study due to patient rotation. Stable cardiomediastinal contours. Unchanged support apparatus. Persistent small bilateral pleural effusions and bibasilar opacities which could reflect atelectasis and or infiltrates. No pneumothorax. No acute finding in the visualized skeleton. IMPRESSION: Limited study due to patient rotation but chest appears stable with bibasilar effusions and opacities which could reflect atelectasis and/or infiltrates. Electronically Signed   By: Audie Pinto M.D.   On: 01/20/2019 15:10   VAS [name] LOWER EXTREMITY VENOUS (DVT)  Result Date: 01/19/2019  Lower Venous Study Indications: Hypoxia.  Risk Factors: COVID 19 positive. Limitations: Body habitus, poor ultrasound/tissue interface, bandages and patient positioning, patient immobility, patient contractures. Comparison Study: No prior studies. Performing Technologist: Oliver Hum RVT  Examination Guidelines: A complete evaluation includes B-mode imaging, spectral Doppler, color Doppler, and power Doppler as  needed of all accessible portions of each vessel. Bilateral testing is considered an integral part of a complete examination. Limited examinations for reoccurring indications may be performed as noted.  +---------+---------------+---------+-----------+----------+--------------+ RIGHT    CompressibilityPhasicitySpontaneityPropertiesThrombus Aging +---------+---------------+---------+-----------+----------+--------------+ CFV      Full           Yes      Yes                                 +---------+---------------+---------+-----------+----------+--------------+ SFJ      Full                                                        +---------+---------------+---------+-----------+----------+--------------+ FV Prox  Full                                                        +---------+---------------+---------+-----------+----------+--------------+ FV Mid   Full                                                        +---------+---------------+---------+-----------+----------+--------------+ FV DistalFull                                                        +---------+---------------+---------+-----------+----------+--------------+ PFV      Full                                                        +---------+---------------+---------+-----------+----------+--------------+ POP      Full           Yes  Yes                                 +---------+---------------+---------+-----------+----------+--------------+ PTV      Full                                                        +---------+---------------+---------+-----------+----------+--------------+ PERO     Full                                                        +---------+---------------+---------+-----------+----------+--------------+   +---------+---------------+---------+-----------+----------+--------------+ LEFT     CompressibilityPhasicitySpontaneityPropertiesThrombus  Aging +---------+---------------+---------+-----------+----------+--------------+ CFV      Full           Yes      Yes                                 +---------+---------------+---------+-----------+----------+--------------+ SFJ      Full                                                        +---------+---------------+---------+-----------+----------+--------------+ FV Prox  Full                                                        +---------+---------------+---------+-----------+----------+--------------+ FV Mid   Full                                                        +---------+---------------+---------+-----------+----------+--------------+ FV DistalFull                                                        +---------+---------------+---------+-----------+----------+--------------+ PFV      Full                                                        +---------+---------------+---------+-----------+----------+--------------+ POP      Full           Yes      Yes                                 +---------+---------------+---------+-----------+----------+--------------+ PTV      Full                                                        +---------+---------------+---------+-----------+----------+--------------+  PERO     Full                                                        +---------+---------------+---------+-----------+----------+--------------+     Summary: Right: There is no evidence of deep vein thrombosis in the lower extremity. No cystic structure found in the popliteal fossa. Left: There is no evidence of deep vein thrombosis in the lower extremity. No cystic structure found in the popliteal fossa.  *See table(s) above for measurements and observations. Electronically signed by Monica Martinez MD on 01/19/2019 at 5:43:59 PM.    Final    VAS [name] UPPER EXTREMITY VENOUS DUPLEX  Result Date: 01/19/2019 UPPER VENOUS STUDY   Indications: Edema Risk Factors: COVID 19 positive. Limitations: Poor ultrasound/tissue interface, body habitus, patient positioning, patient immobility, patient contractures, bandages and line. Comparison Study: No prior studies. Performing Technologist: Oliver Hum RVT  Examination Guidelines: A complete evaluation includes B-mode imaging, spectral Doppler, color Doppler, and power Doppler as needed of all accessible portions of each vessel. Bilateral testing is considered an integral part of a complete examination. Limited examinations for reoccurring indications may be performed as noted.  Right Findings: +----------+------------+---------+-----------+----------+--------------+ RIGHT     CompressiblePhasicitySpontaneousProperties   Summary     +----------+------------+---------+-----------+----------+--------------+ IJV           Full       Yes       Yes                             +----------+------------+---------+-----------+----------+--------------+ Subclavian    Full       Yes       Yes                             +----------+------------+---------+-----------+----------+--------------+ Axillary      Full       Yes       Yes                             +----------+------------+---------+-----------+----------+--------------+ Brachial                                            Not visualized +----------+------------+---------+-----------+----------+--------------+ Radial        Full                                                 +----------+------------+---------+-----------+----------+--------------+ Ulnar         Full                                                 +----------+------------+---------+-----------+----------+--------------+ Cephalic      Full                                                 +----------+------------+---------+-----------+----------+--------------+  Basilic       Full                                                  +----------+------------+---------+-----------+----------+--------------+  Left Findings: +----------+------------+---------+-----------+----------+-------+ LEFT      CompressiblePhasicitySpontaneousPropertiesSummary +----------+------------+---------+-----------+----------+-------+ IJV           Full       Yes       Yes                      +----------+------------+---------+-----------+----------+-------+ Subclavian    Full       Yes       Yes                      +----------+------------+---------+-----------+----------+-------+ Axillary      Full       Yes       Yes                      +----------+------------+---------+-----------+----------+-------+ Brachial      Full       Yes       Yes                      +----------+------------+---------+-----------+----------+-------+ Radial        Full                                          +----------+------------+---------+-----------+----------+-------+ Ulnar         Full                                          +----------+------------+---------+-----------+----------+-------+ Cephalic      Full                                          +----------+------------+---------+-----------+----------+-------+ Basilic       Full                                          +----------+------------+---------+-----------+----------+-------+  Summary:  Right: No evidence of deep vein thrombosis in the upper extremity. No evidence of superficial vein thrombosis in the upper extremity.  Left: No evidence of deep vein thrombosis in the upper extremity. No evidence of superficial vein thrombosis in the upper extremity.  *See table(s) above for measurements and observations.  Diagnosing physician: Monica Martinez MD Electronically signed by Monica Martinez MD on 01/19/2019 at 5:43:05 PM.    Final

## 2019-01-21 NOTE — Progress Notes (Signed)
[facility] Progress Note    Assessment/ Plan:   1.  AKI: likely d/t CTA and vanc toxicity.  Given quadriplegia, her BUN and Cr actually represent a significant AKI in the setting of decreased muscle mass.  Has about 300 mL urine output today.  She is not a dialysis candidate for short or long-term dialysis.   Sig azotemia.  Overall, given her poor functional status and multiple comorbidities, there really isn't a lot to offer from a renal perspective.  I will sign off for now.  Call with questions.  2.  Acute hypoxic RF: back on vent, had a mucus plug 12/29, per PCCM  3.  Combined metabolic and respiratory acidosis: bicarb gtt and vent support.    4.  Quadriplegia: C5-7 injury  5.  COVID 19: decadron, remdesivir  6.  Septic shock: on linezolid, cefepime now  7.  Hypokalemia- repleted, likely d/t bicarb gtt, also GI losses with some liquid stool  7.  Dispo: I fully support palliative care discussions  Subjective:    Acidemia improved with vent support and bicarb gtt.  Unresponsive.   Objective:   BP 122/71   Pulse (!) 116   Temp 99.7 F (37.6 C) (Axillary)   Resp (!) 30   Ht '5\' 2"'$  (1.575 m)   Wt 46.7 kg   LMP 05/27/2016 (Within Days) Comment: Irregular periods since October 2015.  SpO2 98%   BMI 18.83 kg/m   Intake/Output Summary (Last 24 hours) at 01/21/2019 1717 Last data filed at 01/21/2019 1500 Gross per 24 hour  Intake 7098.53 ml  Output 340 ml  Net 6758.53 ml   Weight change: -0.1 kg  Physical Exam: GEN cachectic, unresponsive NECK + Trach in place, on vent EXT sarcopenia and contractures NEURO unresponsive SKIN decubs not examined  Imaging: DG Abd 1 View  Result Date: 01/19/2019 CLINICAL DATA:  49 year old female with history of suprapubic catheter. EXAM: ABDOMEN - 1 VIEW COMPARISON:  CT the abdomen and pelvis 12/22/2018. FINDINGS: No definite suprapubic catheter identified. Radiopaque structure projecting over the low pelvis may  either represent a collection bag associated with an open stoma, or a rectal bag. Percutaneous gastrostomy tube projecting over the epigastric region. Gas is noted in the colon. Paucity of rectal gas. No pathologic dilatation of small bowel. No gross pneumoperitoneum noted on today's single supine view. IMPRESSION: 1. Support apparatus, as above. 2. Nonobstructive bowel gas pattern. Electronically Signed   By: Vinnie Langton M.D.   On: 01/19/2019 20:16   NM Hepatobiliary Liver Func  Result Date: 01/20/2019 CLINICAL DATA:  Possible sepsis. Infected sacral/decubitus ulcers. Quadriplegia. COVID-19. EXAM: NUCLEAR MEDICINE HEPATOBILIARY IMAGING TECHNIQUE: Sequential images of the abdomen were obtained out to 60 minutes following intravenous administration of radiopharmaceutical. RADIOPHARMACEUTICALS:  5.1 mCi Tc-74m Choletec IV COMPARISON:  Abdominal ultrasound 01/19/2019. FINDINGS: Prompt uptake of tracer by the liver. Gallbladder activity is identified, confirming cystic duct patency. Despite imaging for 2 hours, common duct and bowel activity are not identified. IMPRESSION: 1. No evidence of cystic duct obstruction or acute cholecystitis. 2. Absent common duct or bowel activity, can be seen with biliary obstruction. Of note, no bile duct dilatation is seen on the ultrasound of earlier today. Electronically Signed   By: KAbigail MiyamotoM.D.   On: 01/20/2019 19:50   DG CHEST PORT 1 VIEW  Result Date: 01/20/2019 CLINICAL DATA:  hypoxemia EXAM: PORTABLE CHEST 1 VIEW COMPARISON:  Chest radiograph 01/18/2019 FINDINGS: Limited study due to patient rotation. Stable cardiomediastinal contours. Unchanged  support apparatus. Persistent small bilateral pleural effusions and bibasilar opacities which could reflect atelectasis and or infiltrates. No pneumothorax. No acute finding in the visualized skeleton. IMPRESSION: Limited study due to patient rotation but chest appears stable with bibasilar effusions and opacities  which could reflect atelectasis and/or infiltrates. Electronically Signed   By: Audie Pinto M.D.   On: 01/20/2019 15:10    Labs: [facility] Recent Labs  Lab 01/15/19 0421 01/16/19 0310 01/17/19 0428 01/18/19 0323 01/18/19 1322 01/18/19 1844 01/18/19 2155 01/19/19 0417 01/19/19 1821 01/20/19 0500 01/20/19 1210 01/20/19 1256 01/20/19 2043 01/21/19 0545 01/21/19 1108  NA 132* 135 134* 135  --   --  148*  --  138 140 143  --  143  --  139  K 3.6 3.3* 3.6 3.2*  --   --  4.6  --  4.2 5.0 3.8  --  3.3*  --  2.8*  CL 108 111 109 111  --   --   --   --  115* 115*  --   --   --   --  109  CO2 17* 18* 18* 17*  --   --   --   --  16* 16*  --   --   --   --  21*  GLUCOSE 213* 153* 129* 142*  --   --   --   --  201* 120*  --   --   --   --  359*  BUN 18 20 26* 51*  --   --   --   --  87* 103*  --   --   --   --  98*  CREATININE 0.63 0.62 0.57 0.53  --   --   --   --  0.94 1.04*  --  1.12*  --  1.21* 1.19*  CALCIUM 8.0* 8.1* 8.4* 8.7*  --   --   --   --  9.2 7.9*  --   --   --   --  8.2*  PHOS  --   --   --   --  3.0 2.5  --  2.8 2.9  --   --   --   --   --   --    CBC Recent Labs  Lab 01/18/19 0323 01/19/19 0417 01/20/19 0500 01/20/19 1210 01/20/19 2043 01/21/19 0545  WBC 9.3 11.8* 20.5*  --   --  16.8*  HGB 8.6* 8.3* 9.0* 10.9* 10.5* 8.2*  HCT 29.0* 28.6* 31.0* 32.0* 31.0* 27.7*  MCV 87.3 89.9 89.3  --   --  87.1  PLT 448* 583* 741*  --   --  509*    Medications:    . chlorhexidine  15 mL Mouth Rinse BID  . chlorhexidine gluconate (MEDLINE KIT)  15 mL Mouth Rinse BID  . Chlorhexidine Gluconate Cloth  6 each Topical Daily  . heparin injection (subcutaneous)  5,000 Units Subcutaneous Q8H  . hydrocortisone sod succinate (SOLU-CORTEF) inj  50 mg Intravenous Q8H  . insulin aspart  0-24 Units Subcutaneous Q4H  . insulin detemir  5 Units Subcutaneous Daily  . lactulose  20 g Per Tube BID  . mouth rinse  15 mL Mouth Rinse 10 times per day  . midodrine  10 mg Per Tube TID WC  .  mirabegron ER  50 mg Oral Daily  . multivitamin  15 mL Per Tube Daily  . mupirocin ointment  1 application Nasal BID  . nutrition  supplement (JUVEN)  1 packet Per Tube BID BM  . pantoprazole sodium  40 mg Per Tube Daily  . ramelteon  8 mg Oral QHS  . sodium chloride flush  10-40 mL Intracatheter Q12H      Madelon Lips, MD 01/21/2019, 5:17 PM

## 2019-01-21 NOTE — Progress Notes (Signed)
Labs now back  0 K 2.8 -  Creat better  Plan  - check mag and phos tomorrow  - replete K now    SIGNATURE    Dr. Brand Males, M.D., F.C.C.P,  Pulmonary and Critical Care Medicine Staff Physician, [place] Director - Interstitial Lung Disease  Program  P[facility] [place] [facility] [place], [place], [address]  Pager: [phone], If no answer or between  15:00h - 7:00h: call [phone] Telephone: [phone]  2:37 PM 01/21/2019    Recent Labs  Lab 01/17/19 0428 01/18/19 0323 01/18/19 1322 01/18/19 1844 01/19/19 0417 01/19/19 1821 01/20/19 0500 01/20/19 1210 01/20/19 1256 01/20/19 2043 01/21/19 0545 01/21/19 1108  NA 134* 135  --   --   --  138 140 143  --  143  --  139  K 3.6 3.2*  --   --   --  4.2 5.0 3.8  --  3.3*  --  2.8*  CL 109 111  --   --   --  115* 115*  --   --   --   --  109  CO2 18* 17*  --   --   --  16* 16*  --   --   --   --  21*  GLUCOSE 129* 142*  --   --   --  201* 120*  --   --   --   --  359*  BUN 26* 51*  --   --   --  87* 103*  --   --   --   --  98*  CREATININE 0.57 0.53  --   --   --  0.94 1.04*  --  1.12*  --  1.21* 1.19*  CALCIUM 8.4* 8.7*  --   --   --  9.2 7.9*  --   --   --   --  8.2*  MG  --   --  1.5* 1.4* 1.6* 2.2  --   --   --   --   --   --   PHOS  --   --  3.0 2.5 2.8 2.9  --   --   --   --   --   --    Recent Labs  Lab 01/19/19 0417 01/20/19 0500 01/20/19 1210 01/20/19 2043 01/21/19 0545  HGB 8.3* 9.0* 10.9* 10.5* 8.2*  HCT 28.6* 31.0* 32.0* 31.0* 27.7*  WBC 11.8* 20.5*  --   --  16.8*  PLT 583* 741*  --   --  509*    .Angela Page

## 2019-01-21 DEATH — deceased

## 2019-01-22 LAB — COMPREHENSIVE METABOLIC PANEL
ALT: 38 U/L (ref 0–44)
AST: 76 U/L — ABNORMAL HIGH (ref 15–41)
Albumin: 1.7 g/dL — ABNORMAL LOW (ref 3.5–5.0)
Alkaline Phosphatase: 1304 U/L — ABNORMAL HIGH (ref 38–126)
Anion gap: 8 (ref 5–15)
BUN: 104 mg/dL — ABNORMAL HIGH (ref 6–20)
CO2: 25 mmol/L (ref 22–32)
Calcium: 7.7 mg/dL — ABNORMAL LOW (ref 8.9–10.3)
Chloride: 109 mmol/L (ref 98–111)
Creatinine, Ser: 1.63 mg/dL — ABNORMAL HIGH (ref 0.44–1.00)
GFR calc Af Amer: 43 mL/min — ABNORMAL LOW (ref >60)
GFR calc non Af Amer: 37 mL/min — ABNORMAL LOW (ref >60)
Glucose, Bld: 354 mg/dL — ABNORMAL HIGH (ref 70–99)
Potassium: 2.4 mmol/L — CL (ref 3.5–5.1)
Sodium: 142 mmol/L (ref 135–145)
Total Bilirubin: 2.2 mg/dL — ABNORMAL HIGH (ref 0.3–1.2)
Total Protein: 4.8 g/dL — ABNORMAL LOW (ref 6.5–8.1)

## 2019-01-22 LAB — CBC WITH DIFFERENTIAL/PLATELET
Abs Immature Granulocytes: 0.08 10*3/uL — ABNORMAL HIGH (ref 0.00–0.07)
Basophils Absolute: 0 10*3/uL (ref 0.0–0.1)
Basophils Relative: 0 %
Eosinophils Absolute: 0 10*3/uL (ref 0.0–0.5)
Eosinophils Relative: 0 %
HCT: 22.4 % — ABNORMAL LOW (ref 36.0–46.0)
Hemoglobin: 7.1 g/dL — ABNORMAL LOW (ref 12.0–15.0)
Immature Granulocytes: 1 %
Lymphocytes Relative: 10 %
Lymphs Abs: 0.8 10*3/uL (ref 0.7–4.0)
MCH: 26.5 pg (ref 26.0–34.0)
MCHC: 31.7 g/dL (ref 30.0–36.0)
MCV: 83.6 fL (ref 80.0–100.0)
Monocytes Absolute: 1.1 10*3/uL — ABNORMAL HIGH (ref 0.1–1.0)
Monocytes Relative: 13 %
Neutro Abs: 6.5 10*3/uL (ref 1.7–7.7)
Neutrophils Relative %: 76 %
Platelets: 282 10*3/uL (ref 150–400)
RBC: 2.68 MIL/uL — ABNORMAL LOW (ref 3.87–5.11)
RDW: 25.4 % — ABNORMAL HIGH (ref 11.5–15.5)
WBC: 8.5 10*3/uL (ref 4.0–10.5)
nRBC: 0.7 % — ABNORMAL HIGH (ref 0.0–0.2)

## 2019-01-22 LAB — PROTIME-INR
INR: 1.6 — ABNORMAL HIGH (ref 0.8–1.2)
Prothrombin Time: 18.5 seconds — ABNORMAL HIGH (ref 11.4–15.2)

## 2019-01-22 LAB — HEPATIC FUNCTION PANEL
ALT: 38 U/L (ref 0–44)
AST: 77 U/L — ABNORMAL HIGH (ref 15–41)
Albumin: 1.6 g/dL — ABNORMAL LOW (ref 3.5–5.0)
Alkaline Phosphatase: 1486 U/L — ABNORMAL HIGH (ref 38–126)
Bilirubin, Direct: 1 mg/dL — ABNORMAL HIGH (ref 0.0–0.2)
Indirect Bilirubin: 0.9 mg/dL (ref 0.3–0.9)
Total Bilirubin: 1.9 mg/dL — ABNORMAL HIGH (ref 0.3–1.2)
Total Protein: 4.8 g/dL — ABNORMAL LOW (ref 6.5–8.1)

## 2019-01-22 LAB — GLUCOSE, CAPILLARY
Glucose-Capillary: 153 mg/dL — ABNORMAL HIGH (ref 70–99)
Glucose-Capillary: 189 mg/dL — ABNORMAL HIGH (ref 70–99)
Glucose-Capillary: 261 mg/dL — ABNORMAL HIGH (ref 70–99)
Glucose-Capillary: 266 mg/dL — ABNORMAL HIGH (ref 70–99)
Glucose-Capillary: 395 mg/dL — ABNORMAL HIGH (ref 70–99)
Glucose-Capillary: 399 mg/dL — ABNORMAL HIGH (ref 70–99)

## 2019-01-22 LAB — PROCALCITONIN: Procalcitonin: 0.6 ng/mL

## 2019-01-22 MED ORDER — INSULIN ASPART 100 UNIT/ML ~~LOC~~ SOLN
5.0000 [IU] | Freq: Three times a day (TID) | SUBCUTANEOUS | Status: DC
  Administered 2019-01-22 – 2019-01-30 (×12): 5 [IU] via SUBCUTANEOUS

## 2019-01-22 MED ORDER — POTASSIUM CHLORIDE 20 MEQ/15ML (10%) PO SOLN
40.0000 meq | Freq: Once | ORAL | Status: AC
  Administered 2019-01-22: 40 meq
  Filled 2019-01-22: qty 30

## 2019-01-22 MED ORDER — HYDROCORTISONE NA SUCCINATE PF 100 MG IJ SOLR
50.0000 mg | Freq: Two times a day (BID) | INTRAMUSCULAR | Status: DC
  Administered 2019-01-22 – 2019-01-24 (×5): 50 mg via INTRAVENOUS
  Filled 2019-01-22 (×5): qty 2

## 2019-01-22 MED ORDER — SODIUM BICARBONATE-DEXTROSE 150-5 MEQ/L-% IV SOLN
150.0000 meq | INTRAVENOUS | Status: DC
  Filled 2019-01-22: qty 1000

## 2019-01-22 MED ORDER — INSULIN DETEMIR 100 UNIT/ML ~~LOC~~ SOLN
15.0000 [IU] | Freq: Every day | SUBCUTANEOUS | Status: DC
  Administered 2019-01-23 – 2019-01-25 (×3): 15 [IU] via SUBCUTANEOUS
  Filled 2019-01-22 (×5): qty 0.15

## 2019-01-22 MED ORDER — VITAL AF 1.2 CAL PO LIQD
1000.0000 mL | ORAL | Status: DC
  Administered 2019-01-23 – 2019-01-26 (×3): 1000 mL

## 2019-01-22 MED ORDER — POTASSIUM CHLORIDE 10 MEQ/50ML IV SOLN
10.0000 meq | INTRAVENOUS | Status: AC
  Administered 2019-01-22 (×2): 10 meq via INTRAVENOUS
  Filled 2019-01-22 (×2): qty 50

## 2019-01-22 NOTE — Progress Notes (Addendum)
Nutrition Follow-up  DOCUMENTATION CODES:   Not applicable  INTERVENTION:   Tube Feeding:  Recommend attempt advancing TF slowly back to goal rate of Vital AF 1.2 at 60 ml/hr starting tomorrow. Provides 108 g of protein, 1728 kcals and 1166 mL of free water Meets 100% estimated calorie and protein needs  Juven BID, each packet provides 80 calories, 8 grams of carbohydrate, 2.5  grams of protein (collagen), 7 grams of L-arginine and 7 grams of L-glutamine; supplement contains CaHMB, Vitamins C, E, B12 and Zinc to promote wound healing  NUTRITION DIAGNOSIS:   Increased nutrient needs related to chronic illness, acute illness(acute on chronic hypoxia in the setting of COVID-19 infection with tracheostomy; osteomyelitis of left fibula;greater trochanter, stage IV decubitis ulcer; UTI) as evidenced by estimated needs.  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 In ED, CT abdomen and pelvis showed suprapubic catheter appearing to protrude through posterior bladder wall with fluid in the pelvis; left tibia/fibula x-ray showed osseous demineralization concerning for osteomyelitis with wound care and conservative management and found to be COVID-19 positive.  RD working remotely.  12/23 Admit 12/29 Transfer to ICU, DNR 12/31 Mucous plug, trach changed, started vent support  Patient is currently on ventilator support via trach. MV: 10.6 L/min Temp (24hrs), Avg:98.3 F (36.8 C),  Min:96.9 F (36.1 C), Max:100.3 F (37.9 C)   Vital AF 1.2 at 30 ml/hr, Juven BID TF rate decreased due to bloating and diarrhea. Suspect diarrhea related to Lactulose, which was discontinued today.   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  Current weight 55.2 kg Up from 46.5 kg on 12/23  I/O +20.1 L since admission  Labs reviewed. Potassium 2.4 (L), BUN 104 (H), creatinine 1.63 (H) CBG's: [id number]  Medications reviewed and include Solucortef, Novolog SSI, Levemir, Juven, MVI. Receiving Zyvox and KCl infusions.   Diet Order:   Diet Order            Diet NPO time specified  Diet effective now              EDUCATION NEEDS:   No education needs have been identified at this time  Skin:  Skin Assessment: Skin Integrity Issues: Skin Integrity Issues:: Stage IV Stage IV: sacrum  Last BM:  1/2 rectal pouch  Height:   Ht Readings from Last 1 Encounters:  01/20/19 5\' 2"  (1.575 m)    Weight:   Wt Readings from Last 1 Encounters:  01/22/19 55.2 kg   01/08/2019 46.5 kg   Ideal Body Weight:  45 kg  BMI:  Body mass index is 22.26 kg/m.  Estimated Nutritional Needs:   Kcal:  1600-1800  Protein:  90-113 (2-2.5 g/kg)  Fluid:  >/= 1.6 L/day   Molli Barrows, RD, LDN, [place] Pager [phone] After Hours Pager [phone]

## 2019-01-22 NOTE — Progress Notes (Signed)
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  Incidentally found to be Covid positive.  She is chronically on 3 L of oxygen through her tracheostomy.  She is not vent dependent.  Has a bunch of issues: (1) Anemia, unexplained, AC stopped until yesterday, transfused 1 unit (2) Osteomyelitis of hips/sacrum- on empiric abx, ortho does not want to bone biopsy, duration and narrowing TBD (3) Worsening hypoxemia- related to covid and possibly aspiration (4) Malpositioned suprapubic catheter- replaced by urology, no evidence of leak on CT (5) UTI vs. colonization (6) Anxiety/insomnia requiring intermittent benzodiazepines (7) Elevated Alk phos related to bony involvement/osteo (8) acute on chronic hypotension  PCCM consulted as patient deteriorated further from respiratory standpoint requiring suctioning to bring back up saturations.   Past Medical History    has a past medical history of Acute on chronic respiratory failure with hypoxia ([place]), Anasarca (06/10/2016), Asthma, At high risk for severe sepsis, Cocaine use (10/08/2013), Decubitus ulcer of buttock, unstageable ([place]), Depression, Diabetes mellitus without complication ([place]), GERD (gastroesophageal reflux disease), HCAP (healthcare-associated pneumonia) (06/09/2016), Hepatitis, Kidney stone, Lobar pneumonia ([place]), Overdose of opiate or related narcotic ([place]) (09/02/2014), Pacemaker, Pleurisy, Polysubstance dependence including opioid type drug, episodic abuse ([place]) (10/27/2013), Protein calorie malnutrition ([place]) (10/31/2015), Quadriparesis ([place]), Quadriplegia and quadriparesis ([place]), Quadriplegia, C5-C7 incomplete ([place]) (10/08/2013), Stage IV pressure ulcer of sacral region ([place]) (10/31/2015), and  Tracheostomy status ([place]).   has a past surgical history that includes Appendectomy; Cardiac surgery; I & D extremity (10/28/2011); ir generic historical (11/23/2015); Back surgery (10/08/2013); Tracheostomy; Gastrostomy (11/23/2015); Insertion of suprapubic catheter (N/A, 07/28/2016); Cystoscopy w/ ureteral stent placement (Right, 03/09/2017); Cystoscopy/ureteroscopy/holmium laser/stent placement (Right, 07/16/2017); Cystoscopy w/ ureteral stent removal (Right, 07/16/2017); Lithotripsy (08/2017); IR PATIENT EVAL TECH 0-60 MINS (12/18/2017); and IR REPLACE G-TUBE SIMPLE WO FLUORO (12/19/2017).   Significant Hospital Events   01/05/2019 -admit  12/27 - pall care consjult  12/28 - CTA - No PE   12/29 - ccm consul;t, limited code and icu transfer. . On iv heparin gtt for suspected PE  12/30 -  mag replaced. On IV heparin gtt. On o2 via trach at 4L [place]. On bair hugger. Bedsidde RN reports that 40 min aftrer transfer here patient has been fine and not needed vent since yesterday. Was on levophed few houirs yesterday but improved after midodrine yesterday. Currently sb p 92/map 65. WAs On scheduled albumin Q6h since yesterday -> I v heparin gtt stopped  12/.31 -  recallled by triad after MD noticed lethargy and tachypnea . BUN up at 103 and creat jumped. Has worsening metab and resp acidosis with ph 7.08 . dUplex LE negativ 01/19/2019- > back on vent. Renal consult: AKI due to CTA and vanc. Recommend med mgmt only. Poor CRRT/HD candidate per renal  1/1 -  on neo gtt and bic gtt d5water at 100cc/h and back on vent with 50% fio2. Has 4 cuffed [place] trach. Making urine but output hard to measure due to suprapubic cath. EDema + per RN. Does not communicate much. Creat worse today. On TF  Consults:  ID, urology, wound care  Procedures:  12/24 SPC replaced  Significant Diagnostic Tests:  CXR 12/29 IMPRESSION: Small left pleural effusion and basilar airspace disease have worsened since the most recent plain  film. Small right  pleural effusion and basilar airspace disease appear unchanged.  CT Chest 12/28 IMPRESSION: 1. No evidence of a pulmonary embolism. 2. Suspect pulmonary edema, likely on the basis of congestive heart failure, reflected by interstitial thickening, small bilateral effusions and patchy areas of ground-glass opacity. Infection should be considered if there are consistent clinical findings. 3. There is additional dependent opacity in the lower lobes consistent with atelectasis. 4. Two vessel coronary artery calcifications. Minor aortic atherosclerosis.  Micro Data:  12/23 - urine - proteus 12/23 - covid + 122/28 - MRSA PCR positive     Antimicrobials:  Vancomycin 12/23 >>  12/30 Cefepime 12/23 (proteus) uti >> Remdesivir 12/23 - 12/27 Doxycycline 12/27 >> 12/29 Linezolid 12/31 >>  Anti-infectives (From admission, onward)   Start     Dose/Rate Route Frequency Ordered Stop   01/20/19 2200  linezolid (ZYVOX) IVPB 600 mg     600 mg 300 mL/hr over 60 Minutes Intravenous Every 12 hours 01/20/19 1242     01/20/19 0534  ceFEPIme (MAXIPIME) 2 g in sodium chloride 0.9 % 100 mL IVPB     2 g 200 mL/hr over 30 Minutes Intravenous Every 12 hours 01/19/19 2028     01/19/19 1628  vancomycin variable dose per unstable renal function (pharmacist dosing)  Status:  Discontinued      Does not apply See admin instructions 01/19/19 1628 01/20/19 1242   01/16/19 2200  doxycycline (VIBRA-TABS) tablet 100 mg  Status:  Discontinued     100 mg Oral Every 12 hours 01/16/19 1703 01/18/19 1138   01/13/19 1000  remdesivir 100 mg in sodium chloride 0.9 % 100 mL IVPB     100 mg 200 mL/hr over 30 Minutes Intravenous Daily 01/19/2019 2243 01/16/19 0902   01/13/19 0200  ceFEPIme (MAXIPIME) 2 g in sodium chloride 0.9 % 100 mL IVPB  Status:  Discontinued     2 g 200 mL/hr over 30 Minutes Intravenous Every 8 hours 01/13/2019 1831 01/19/19 2028   12/25/2018 2245  remdesivir 200 mg in sodium chloride  0.9% 250 mL IVPB     200 mg 580 mL/hr over 30 Minutes Intravenous Once 01/14/2019 2243 01/13/19 0210   12/28/2018 1830  vancomycin (VANCOCIN) IVPB 1000 mg/200 mL premix  Status:  Discontinued     1,000 mg 200 mL/hr over 60 Minutes Intravenous Every 24 hours 12/27/2018 1831 01/19/19 0029   12/31/2018 1730  vancomycin (VANCOCIN) IVPB 1000 mg/200 mL premix  Status:  Discontinued     1,000 mg 200 mL/hr over 60 Minutes Intravenous  Once 01/20/2019 1717 12/29/2018 1831   01/10/2019 1715  ceFEPIme (MAXIPIME) 2 g in sodium chloride 0.9 % 100 mL IVPB     2 g 200 mL/hr over 30 Minutes Intravenous  Once 12/23/2018 1713 12/27/2018 1837         Interim history/subjective:    01/22/2019 -off pressors. On vent. RN says she is a bit more drowsy. TF making her continuous  stool despite flexiseal and c hanged to rectal pouch but even with that there is leak. TF at 60cc/h. Abd tense with TF.  K dropped to 2.4, BUN/Creat 104/1.6. RN concerned abou sacral decub  Objective   Blood pressure (!) 89/56, pulse 80, temperature 100.3 F (37.9 C), temperature source Axillary, resp. rate 20, height 5' 2" (1.575 m), weight 55.2 kg, last menstrual period 05/27/2016, SpO2 100 %.    Vent Mode: PRVC FiO2 (%):  [40 %-50 %] 40 % Set Rate:  [16 bmp] 16 bmp Vt Set:  [  400 mL] 400 mL PEEP:  [5 cmH20] 5 cmH20 Plateau Pressure:  [20 POE42-35 cmH20] 25 cmH20   Intake/Output Summary (Last 24 hours) at 01/22/2019 0951 Last data filed at 01/22/2019 0900 Gross per 24 hour  Intake 5509.58 ml  Output 1110 ml  Net 4399.58 ml   Filed Weights   01/20/19 0433 01/21/19 0500 01/22/19 0500  Weight: 46.8 kg 46.7 kg 55.2 kg     General Appearance:  Looks criticall ill. FRAIL Head:  Normocephalic, without obvious abnormality, atraumatic Eyes:  PERRL - yes, conjunctiva/corneas - muddy     Ears:  Normal external ear canals, both ears Nose:  G tube - no Throat:  ETT TUBE - no , OG tube - no. TRACH + Neck:  Supple,  No enlargement/tenderness/nodules  Lungs: Clear to auscultation bilaterally, Ventilator   Synchrony - yes Heart:  S1 and S2 normal, no murmur, CVP - x.  Pressors - no Abdomen:  Soft, no masses, no organomegaly. Edema +. Distended soft. PEG + Genitalia / Rectal:  Not done Extremities:  Extremities- intact but in contractures Skin:  ntact in exposed areas . Sacral area - per hx decub  Neurologic:  Sedation - none -> RASS - +1 . Moves all 4s - no is a quad. CAM-ICU - x . Orientation - tried to stick her tongue out to command    Resolved Hospital Problem list   N/A  Assessment & Plan:  # Acute on chronic hypoxemic respiratory failure in setting of multiple comorbidities and encephalopathy.  Differential here is worsening Covid infection, aspiration, inability to handle secretions.  I doubt she developed a blood clot between yesterday and today.  She has been started on heparin drip 12./29 (PE was ruled out 1 d earlier on 12/28 and duple LE/UE neg 01/19/2019)   - 01/22/2019 -back on the ventilator on January 20, 2019 due to mixed metabolic and respiratory acidosis.  Clinically she looks like she continues to need the ventilator ] Plan -Continue full ventilator support   # COVID- infiltrates on CT either viral or aspiration or low oncotic pressure edema - Finished course of dexamethasone and remdesivir  Plan  - monitor   #Circulatory shock with background chronic vasoplegia (s/p albumin 12/29-12/30/2020)   01/22/2019 -off Neo-Synephrine after fluid bolus and midodrine restart on January 21, 2019  Plan  - MAP goal > 55 and sbop > 90 - neo - midodrine - hydrocort reduce from 50 mg 3 times daily to every 12 hours   #Acidosis - metabolic and resp due t o AKI with poor resp compensation due to quad and low muscle mass   January 22, 2019: Continues to be on bicarb drip at 100 cc/h  Plan -Reduce bicarb to 50 cc/h =-Check ABG later  = see renal and resp section  #AKI -   1/2 -  - worse and worse again 01/21/2019. Renal  supsects CTA and vanc related. Renal signed off. Not a candidate for HD/CRRT. Wrosning bun could be Tf related  Plan - reduce TF (has diarrhea and bloating) - - bic gtt but reduce - poor HD and CRRT candidate   # Suprapubic catheter with initial concern for posterior bladder wall perforation, subsequent CT abdomen pelvis did not show a leak, suprapubic catheter changed on 01/13/2019  Plan  - monitor   # Proteus colonization of suprapubic catheter-   Plan  -- should be covered by cefepime regardless    # Acute on chronic anemia- unexplained but likely related to  phlebotomy, poor nutrition, critical illness bone marrow dysfunction,  stable after 1 unit  01/22/2019 - hgb 7.1gm% +. No overt bleeding  Plan  - - PRBC for hgb </= 6.9gm%    - exceptions are   -  if ACS susepcted/confirmed then transfuse for hgb </= 8.0gm%,  or    -  active bleeding with hemodynamic instability, then transfuse regardless of hemoglobin value   At at all times try to transfuse 1 unit prbc as possible with exception of active hemorrhage     # Acute on chronic metabolic encephalopathy, myriad of reasons for this.  01/22/2019 - awake an followed 1 command but not at baseline. 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  Bone biopsy not possible at this time due to Covid status and fraility.  Plan  - linezold  - cefepime - wound care  # Severe protein calorie malnutrition present on admission-   .01/22/2019 - diarrhae and bloating with TF  Plan  - - continue TF but reduce from 60cc/ to 30cc.h  # Elevated ammonia- probably related to muscle breakdown  01/22/2019 - on lactulose. Has diarrha. Unclear benefit for lactulose in this situation  Plan - dc lactulose  # Elevated GGT, abnormal CT appearance of  gallbladder 01/11/2001  - 2. Findings which could be suggestive of acute cholecystitis with hyperenhancement of the gallbladder wall, pericholecystic fluid and pericholecystic inflammatory changes. Inflammatory changes extend around the liver and into the right pericolic gutter. If further evaluation is required, would recommend right upper quadrant ultrasound   01/22/2019 - improving alk pos  Plan  - monitor - high risk fo rany procedure    # Quadriplegia/SAcral decub related to prior spinal cord injury with sacral decub  Plan  - dc gabapentin for chronic pain baeline med du eto AKI (can restart later if needed) - wound care for sacral decub - do opiopid iv prn if needed    # Multiorgan failure approaching end of life- palliative following, NO CPR But full medical care + . Mom has not vseen patien tin 1 year  Plan  - see if palliative care can do outpatient family/mom face-to-face physical visit to help bring closure     Best practice:  Diet: TF Pain/Anxiety/Delirium protocol (if indicated): Hold given encephalopathy VAP protocol (if indicated): Not needed right now DVT prophylaxis: heparin gtt -> change to heparin sq GI prophylaxis: protonix Glucose control: SSI  Mobility: BR  Code Status: January 21, 2019: Partial code - advised mom she is not  A good candidate for CRRt/HD. Mom asked [name] to pray for daughter and says she misses her.    Family:: mom on phone. Mom says lasr seen a year ago (2015 quad after fall from deck) . Mom  updated again 01/22/2019. Given overall poor prognosis. Advised palliative meet with physical visit from mom to help connect with daughter who she has seen only once I past year   Disposition: ICU.  CCM back primary  ATTESTATION & SIGNATURE     Dr. Brand Males, M.D., [facility].C.P Pulmonary and Critical Care Medicine Staff Physician [place] [facility] Pager: [phone], If no answer or between   15:00h - 7:00h: call [phone]  01/22/2019 9:51 AM  LABS    PULMONARY Recent Labs  Lab 01/18/19 2155 01/20/19 1210 01/20/19 2043  PHART 7.236* 7.083* 7.219*  PCO2ART 65.0* 52.4* 41.0  PO2ART 73.0* 67.0* 92.0  HCO3 27.6 15.6* 17.0*  TCO2 30 17* 18*  O2SAT 91.0 84.0 96.0    CBC Recent Labs  Lab 01/21/19 0545 01/22/19 0626 01/22/19 0807  HGB 8.2* QUESTIONABLE RESULTS, RECOMMEND RECOLLECT TO VERIFY 7.1*  HCT 27.7* QUESTIONABLE RESULTS, RECOMMEND RECOLLECT TO VERIFY 22.4*  WBC 16.8* QUESTIONABLE RESULTS, RECOMMEND RECOLLECT TO VERIFY 8.5  PLT 509* QUESTIONABLE RESULTS, RECOMMEND RECOLLECT TO VERIFY 282    COAGULATION Recent Labs  Lab 01/22/19 0626 01/22/19 0807  INR QUESTIONABLE RESULTS, RECOMMEND RECOLLECT TO VERIFY 1.6*    CARDIAC  No results for input(s): TROPONINI in the last 168 hours. No results for input(s): PROBNP in the last 168 hours.   CHEMISTRY Recent Labs  Lab 01/18/19 0323 01/18/19 1322 01/18/19 1844 01/19/19 0417 01/19/19 1821 01/20/19 0500 01/20/19 1210 01/20/19 1256 01/20/19 2043 01/21/19 0545 01/21/19 1108 01/22/19 0807  NA 135  --   --   --  138 140 143  --  143  --  139 142  K 3.2*  --   --   --  4.2 5.0 3.8  --  3.3*  --  2.8* 2.4*  CL 111  --   --   --  115* 115*  --   --   --   --  109 109  CO2 17*  --   --   --  16* 16*  --   --   --   --  21* 25  GLUCOSE 142*  --   --   --  201* 120*  --   --   --   --  359* 354*  BUN 51*  --   --   --  87* 103*  --   --   --   --  98* 104*  CREATININE 0.53  --   --   --  0.94 1.04*  --  1.12*  --  1.21* 1.19* 1.63*  CALCIUM 8.7*  --   --   --  9.2 7.9*  --   --   --   --  8.2* 7.7*  MG  --  1.5* 1.4* 1.6* 2.2  --   --   --   --   --   --   --   PHOS  --  3.0 2.5 2.8 2.9  --   --   --   --   --   --   --    Estimated Creatinine Clearance: 33.4 mL/min (A) (by C-G formula based on SCr of 1.63 mg/dL (H)).   LIVER Recent Labs  Lab 01/18/19 0323 01/19/19 1821 01/20/19 0500  01/21/19 1108 01/22/19 0626 01/22/19 0807  AST 39 39 42* 44*  --  76*  ALT _0 --  38  ALKPHOS 825* 615* 632* 1,527*  --  1,304*  BILITOT 1.3* 1.2 1.2 1.7*  --  2.2*  PROT 6.2* 6.4* 6.3* 5.3*  --  4.8*  ALBUMIN 1.8* 3.0* 2.6* 1.8*  --  1.7*  INR  --   --   --   --  QUESTIONABLE RESULTS, RECOMMEND RECOLLECT TO VERIFY 1.6*     INFECTIOUS Recent Labs  Lab 01/20/19 1256 01/21/19 0545 01/22/19 0807  LATICACIDVEN 0.8  --   --   PROCALCITON 0.42 0.30 0.60  ENDOCRINE CBG (last 3)  Recent Labs    01/21/19 2302 01/22/19 0351 01/22/19 0739  GLUCAP 417* 395* 261*         IMAGING x48h  - image(s) personally visualized  -   highlighted in bold NM Hepatobiliary Liver Func  Result Date: 01/20/2019 CLINICAL DATA:  Possible sepsis. Infected sacral/decubitus ulcers. Quadriplegia. COVID-19. EXAM: NUCLEAR MEDICINE HEPATOBILIARY IMAGING TECHNIQUE: Sequential images of the abdomen were obtained out to 60 minutes following intravenous administration of radiopharmaceutical. RADIOPHARMACEUTICALS:  5.1 mCi Tc-42m Choletec IV COMPARISON:  Abdominal ultrasound 01/19/2019. FINDINGS: Prompt uptake of tracer by the liver. Gallbladder activity is identified, confirming cystic duct patency. Despite imaging for 2 hours, common duct and bowel activity are not identified. IMPRESSION: 1. No evidence of cystic duct obstruction or acute cholecystitis. 2. Absent common duct or bowel activity, can be seen with biliary obstruction. Of note, no bile duct dilatation is seen on the ultrasound of earlier today. Electronically Signed   By: KAbigail MiyamotoM.D.   On: 01/20/2019 19:50   DG CHEST PORT 1 VIEW  Result Date: 01/20/2019 CLINICAL DATA:  hypoxemia EXAM: PORTABLE CHEST 1 VIEW COMPARISON:  Chest radiograph 01/18/2019 FINDINGS: Limited study due to patient rotation. Stable cardiomediastinal contours. Unchanged support apparatus. Persistent small bilateral pleural effusions and bibasilar opacities  which could reflect atelectasis and or infiltrates. No pneumothorax. No acute finding in the visualized skeleton. IMPRESSION: Limited study due to patient rotation but chest appears stable with bibasilar effusions and opacities which could reflect atelectasis and/or infiltrates. Electronically Signed   By: NAudie PintoM.D.   On: 01/20/2019 15:10

## 2019-01-23 ENCOUNTER — Inpatient Hospital Stay (HOSPITAL_COMMUNITY): Payer: Medicare Other

## 2019-01-23 DIAGNOSIS — E872 Acidosis: Secondary | ICD-10-CM

## 2019-01-23 DIAGNOSIS — N390 Urinary tract infection, site not specified: Secondary | ICD-10-CM

## 2019-01-23 DIAGNOSIS — Z66 Do not resuscitate: Secondary | ICD-10-CM

## 2019-01-23 LAB — POCT I-STAT 7, (LYTES, BLD GAS, ICA,H+H)
Acid-Base Excess: 3 mmol/L — ABNORMAL HIGH (ref 0.0–2.0)
Acid-Base Excess: 1 mmol/L (ref 0.0–2.0)
Bicarbonate: 26.2 mmol/L (ref 20.0–28.0)
Bicarbonate: 25.6 mmol/L (ref 20.0–28.0)
Calcium, Ion: 1.15 mmol/L (ref 1.15–1.40)
Calcium, Ion: 1.1 mmol/L — ABNORMAL LOW (ref 1.15–1.40)
HCT: 27 % — ABNORMAL LOW (ref 36.0–46.0)
HCT: 27 % — ABNORMAL LOW (ref 36.0–46.0)
Hemoglobin: 9.2 g/dL — ABNORMAL LOW (ref 12.0–15.0)
Hemoglobin: 9.2 g/dL — ABNORMAL LOW (ref 12.0–15.0)
O2 Saturation: 91 %
O2 Saturation: 91 %
Patient temperature: 97.8
Patient temperature: 98.4
Potassium: 3 mmol/L — ABNORMAL LOW (ref 3.5–5.1)
Potassium: 3.7 mmol/L (ref 3.5–5.1)
Sodium: 143 mmol/L (ref 135–145)
Sodium: 142 mmol/L (ref 135–145)
TCO2: 27 mmol/L (ref 22–32)
TCO2: 27 mmol/L (ref 22–32)
pCO2 arterial: 34.8 mmHg (ref 32.0–48.0)
pCO2 arterial: 37.3 mmHg (ref 32.0–48.0)
pH, Arterial: 7.483 — ABNORMAL HIGH (ref 7.350–7.450)
pH, Arterial: 7.444 (ref 7.350–7.450)
pO2, Arterial: 54 mmHg — ABNORMAL LOW (ref 83.0–108.0)
pO2, Arterial: 57 mmHg — ABNORMAL LOW (ref 83.0–108.0)

## 2019-01-23 LAB — CBC WITH DIFFERENTIAL/PLATELET
Abs Immature Granulocytes: 0.07 10*3/uL (ref 0.00–0.07)
Basophils Absolute: 0 10*3/uL (ref 0.0–0.1)
Basophils Relative: 0 %
Eosinophils Absolute: 0.2 10*3/uL (ref 0.0–0.5)
Eosinophils Relative: 1 %
HCT: 22.8 % — ABNORMAL LOW (ref 36.0–46.0)
Hemoglobin: 7.2 g/dL — ABNORMAL LOW (ref 12.0–15.0)
Immature Granulocytes: 1 %
Lymphocytes Relative: 12 %
Lymphs Abs: 1.5 10*3/uL (ref 0.7–4.0)
MCH: 26.1 pg (ref 26.0–34.0)
MCHC: 31.6 g/dL (ref 30.0–36.0)
MCV: 82.6 fL (ref 80.0–100.0)
Monocytes Absolute: 1.4 10*3/uL — ABNORMAL HIGH (ref 0.1–1.0)
Monocytes Relative: 11 %
Neutro Abs: 9.4 10*3/uL — ABNORMAL HIGH (ref 1.7–7.7)
Neutrophils Relative %: 75 %
Platelets: 250 10*3/uL (ref 150–400)
RBC: 2.76 MIL/uL — ABNORMAL LOW (ref 3.87–5.11)
RDW: 25.6 % — ABNORMAL HIGH (ref 11.5–15.5)
WBC: 12.6 10*3/uL — ABNORMAL HIGH (ref 4.0–10.5)
nRBC: 0.4 % — ABNORMAL HIGH (ref 0.0–0.2)

## 2019-01-23 LAB — GLUCOSE, CAPILLARY
Glucose-Capillary: 89 mg/dL (ref 70–99)
Glucose-Capillary: 78 mg/dL (ref 70–99)
Glucose-Capillary: 163 mg/dL — ABNORMAL HIGH (ref 70–99)
Glucose-Capillary: 103 mg/dL — ABNORMAL HIGH (ref 70–99)
Glucose-Capillary: 80 mg/dL (ref 70–99)
Glucose-Capillary: 117 mg/dL — ABNORMAL HIGH (ref 70–99)

## 2019-01-23 LAB — CREATININE, SERUM
Creatinine, Ser: 1.33 mg/dL — ABNORMAL HIGH (ref 0.44–1.00)
GFR calc Af Amer: 55 mL/min — ABNORMAL LOW (ref >60)
GFR calc non Af Amer: 47 mL/min — ABNORMAL LOW (ref >60)

## 2019-01-23 LAB — HEPATIC FUNCTION PANEL
ALT: 52 U/L — ABNORMAL HIGH (ref 0–44)
AST: 115 U/L — ABNORMAL HIGH (ref 15–41)
Albumin: 1.5 g/dL — ABNORMAL LOW (ref 3.5–5.0)
Alkaline Phosphatase: 1270 U/L — ABNORMAL HIGH (ref 38–126)
Bilirubin, Direct: 1.2 mg/dL — ABNORMAL HIGH (ref 0.0–0.2)
Indirect Bilirubin: 0.9 mg/dL (ref 0.3–0.9)
Total Bilirubin: 2.1 mg/dL — ABNORMAL HIGH (ref 0.3–1.2)
Total Protein: 5.1 g/dL — ABNORMAL LOW (ref 6.5–8.1)

## 2019-01-23 LAB — MAGNESIUM: Magnesium: 1.6 mg/dL — ABNORMAL LOW (ref 1.7–2.4)

## 2019-01-23 LAB — PROTIME-INR
INR: 1.5 — ABNORMAL HIGH (ref 0.8–1.2)
Prothrombin Time: 17.5 seconds — ABNORMAL HIGH (ref 11.4–15.2)

## 2019-01-23 LAB — PHOSPHORUS: Phosphorus: <1 mg/dL — CL (ref 2.5–4.6)

## 2019-01-23 MED ORDER — POTASSIUM PHOSPHATES 15 MMOLE/5ML IV SOLN
30.0000 mmol | Freq: Once | INTRAVENOUS | Status: AC
  Administered 2019-01-23: 30 mmol via INTRAVENOUS
  Filled 2019-01-23: qty 10

## 2019-01-23 MED ORDER — SODIUM BICARBONATE-DEXTROSE 150-5 MEQ/L-% IV SOLN: 150.0000 meq | INTRAVENOUS | Status: DC

## 2019-01-23 NOTE — Progress Notes (Signed)
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  Incidentally found to be Covid positive.  She is chronically on 3 L of oxygen through her tracheostomy.  She is not vent dependent.  Has a bunch of issues: (1) Anemia, unexplained, AC stopped until yesterday, transfused 1 unit (2) Osteomyelitis of hips/sacrum- on empiric abx, ortho does not want to bone biopsy, duration and narrowing TBD (3) Worsening hypoxemia- related to covid and possibly aspiration (4) Malpositioned suprapubic catheter- replaced by urology, no evidence of leak on CT (5) UTI vs. colonization (6) Anxiety/insomnia requiring intermittent benzodiazepines (7) Elevated Alk phos related to bony involvement/osteo (8) acute on chronic hypotension  PCCM consulted as patient deteriorated further from respiratory standpoint requiring suctioning to bring back up saturations.   Past Medical History    has a past medical history of Acute on chronic respiratory failure with hypoxia ([place]), Anasarca (06/10/2016), Asthma, At high risk for severe sepsis, Cocaine use (10/08/2013), Decubitus ulcer of buttock, unstageable ([place]), Depression, Diabetes mellitus without complication ([place]), GERD (gastroesophageal reflux disease), HCAP (healthcare-associated pneumonia) (06/09/2016), Hepatitis, Kidney stone, Lobar pneumonia ([place]), Overdose of opiate or related narcotic ([place]) (09/02/2014), Pacemaker, Pleurisy, Polysubstance dependence including opioid type drug, episodic abuse ([place]) (10/27/2013), Protein calorie malnutrition ([place]) (10/31/2015), Quadriparesis ([place]), Quadriplegia and quadriparesis ([place]), Quadriplegia, C5-C7 incomplete ([place]) (10/08/2013), Stage IV pressure ulcer of sacral region ([place]) (10/31/2015), and  Tracheostomy status ([place]).   has a past surgical history that includes Appendectomy; Cardiac surgery; I & D extremity (10/28/2011); ir generic historical (11/23/2015); Back surgery (10/08/2013); Tracheostomy; Gastrostomy (11/23/2015); Insertion of suprapubic catheter (N/A, 07/28/2016); Cystoscopy w/ ureteral stent placement (Right, 03/09/2017); Cystoscopy/ureteroscopy/holmium laser/stent placement (Right, 07/16/2017); Cystoscopy w/ ureteral stent removal (Right, 07/16/2017); Lithotripsy (08/2017); IR PATIENT EVAL TECH 0-60 MINS (12/18/2017); and IR REPLACE G-TUBE SIMPLE WO FLUORO (12/19/2017).   Significant Hospital Events   01/13/2019 -admit  12/27 - pall care consjult  12/28 - CTA - No PE   12/29 - ccm consul;t, limited code and icu transfer. . On iv heparin gtt for suspected PE  12/30 -  mag replaced. On IV heparin gtt. On o2 via trach at 4L [place]. On bair hugger. Bedsidde RN reports that 40 min aftrer transfer here patient has been fine and not needed vent since yesterday. Was on levophed few houirs yesterday but improved after midodrine yesterday. Currently sb p 92/map 65. WAs On scheduled albumin Q6h since yesterday -> I v heparin gtt stopped  12/.31 -  recallled by triad after MD noticed lethargy and tachypnea . BUN up at 103 and creat jumped. Has worsening metab and resp acidosis with ph 7.08 . dUplex LE negativ 01/19/2019- > back on vent. Renal consult: AKI due to CTA and vanc. Recommend med mgmt only. Poor CRRT/HD candidate per renal  1/1 -  on neo gtt and bic gtt d5water at 100cc/h and back on vent with 50% fio2. Has 4 cuffed [place] trach. Making urine but output hard to measure due to suprapubic cath. EDema + per RN. Does not communicate much. Creat worse today. On TF  1/2- off pressors. On vent. RN says she is a bit more drowsy. TF making her continuous  stool despite flexiseal and c hanged to rectal pouch but even with that there is leak. TF at 60cc/h. Abd tense  with TF.  K dropped to 2.4,  BUN/Creat 104/1.6. RN concerned abou sacral decub  Consults:  ID, urology, wound care  Procedures:  12/24 [facility] replaced  Significant Diagnostic Tests:  CXR 12/29 IMPRESSION: Small left pleural effusion and basilar airspace disease have worsened since the most recent plain film. Small right pleural effusion and basilar airspace disease appear unchanged.  CT Chest 12/28 IMPRESSION: 1. No evidence of a pulmonary embolism. 2. Suspect pulmonary edema, likely on the basis of congestive heart failure, reflected by interstitial thickening, small bilateral effusions and patchy areas of ground-glass opacity. Infection should be considered if there are consistent clinical findings. 3. There is additional dependent opacity in the lower lobes consistent with atelectasis. 4. Two vessel coronary artery calcifications. Minor aortic atherosclerosis.  Micro Data:  12/23 - urine - proteus 12/23 - covid + 122/28 - MRSA PCR positive     Antimicrobials:  Vancomycin 12/23 >>  12/30 Cefepime 12/23 (proteus) uti >> Remdesivir 12/23 - 12/27 Doxycycline 12/27 >> 12/29 Linezolid 12/31 >>       Interim history/subjective:    01/23/2019 -elctrolytes replaced. Palliative care meeting with mom. On rduced TF due to bloating and diarrhea. Lactulseo was stopped yestrdaybut still has diarrerha. RN says bowel sounds fine and belly soft but abd distended than several days ago. To MD looks same. Off pressors. Stay [place]. On midodrine  Objective   Blood pressure 99/65, pulse 80, temperature (!) 97.5 F (36.4 C), temperature source Axillary, resp. rate (!) 23, height _0  (1.575 m), weight 55.1 kg, last menstrual period 05/27/2016, SpO2 93 %.    Vent Mode: PRVC FiO2 (%):  [40 %] 40 % Set Rate:  [16 bmp] 16 bmp Vt Set:  [400 mL] 400 mL PEEP:  [5 cmH20] 5 cmH20 Plateau Pressure:  [17 cmH20-26 cmH20] 25 cmH20   Intake/Output Summary (Last 24 hours) at 01/23/2019 1128 Last data filed at 01/23/2019  1100 Gross per 24 hour  Intake 3101.39 ml  Output 955 ml  Net 2146.39 ml   Filed Weights   01/21/19 0500 01/22/19 0500 01/23/19 0500  Weight: 46.7 kg 55.2 kg 55.1 kg   Exam today is same as exam from  Yesterday below  General Appearance:  Looks criticall ill. FRAIL Head:  Normocephalic, without obvious abnormality, atraumatic Eyes:  PERRL - yes, conjunctiva/corneas - muddy     Ears:  Normal external ear canals, both ears Nose:  G tube - no Throat:  ETT TUBE - no , OG tube - no. TRACH + Neck:  Supple,  No enlargement/tenderness/nodules Lungs: Clear to auscultation bilaterally, Ventilator   Synchrony - yes Heart:  S1 and S2 normal, no murmur, CVP - x.  Pressors - no Abdomen:  Soft, no masses, no organomegaly. Edema +. Distended soft. PEG + Genitalia / Rectal:  Not done Extremities:  Extremities- intact but in contractures Skin:  ntact in exposed areas . Sacral area - per hx decub  Neurologic:  Sedation - none -> RASS - +1 . Moves all 4s - no is a quad. CAM-ICU - x . Orientation - tried to stick her tongue out to command    Resolved Hospital Problem list   N/A  Assessment & Plan:  # Acute on chronic hypoxemic respiratory failure in setting of multiple comorbidities and encephalopathy.  Differential here is worsening Covid infection, aspiration, inability to handle secretions.  I doubt she developed a blood clot between yesterday and today.  She has been started on heparin drip 12./29 (PE was  ruled out 1 d earlier on 12/28 and duple LE/UE neg 01/19/2019)   - 01/23/2019 -back on the ventilator on January 20, 2019 due to mixed metabolic and respiratory acidosis.  Clinically she looks like she continues to need the ventilator ] Plan -Continue full ventilator support - can aim for PSV trials 01/23/2019 - No ATC   # COVID- infiltrates on CT either viral or aspiration or low oncotic pressure edema - Finished course of dexamethasone and remdesivir  Plan  - monitor   #Circulatory  shock with background chronic vasoplegia (s/p albumin 12/29-12/30/2020)   01/23/2019 -off Neo-Synephrine x 48h after fluid bolus and midodrine restart on January 21, 2019  Plan  - MAP goal > 55 and sbop > 90 - neo - midodrine - hydrocort 50 mg every 12 hours; ? Needs it long term  Need to assess   #Acidosis - metabolic and resp due t o AKI with poor resp compensation due to quad and low muscle mass   January 22, 2019: Continues to be on bicarb drip at but reduced to 50cc/h  Plan -Reduce bicarb to 10 cc/h =-Check ABG and then if pH ok dc bic (renal function better) = see renal and resp section  #AKI -   1/2 -  - worse and worse again 01/21/2019. Renal supsects CTA and vanc related. Renal signed off. Not a candidate for HD/CRRT. Wrosning bun could be Tf related  01/23/2019  - creat better but BUN pending  Plan - reduce TF (has diarrhea and bloating) - - bic gtt but reduce and check aBG - poor HD and CRRT candidate -    # Suprapubic catheter with initial concern for posterior bladder wall perforation, subsequent CT abdomen pelvis did not show a leak, suprapubic catheter changed on 01/13/2019  Plan  - monitor   # Proteus colonization of suprapubic catheter-   Plan  -- should be covered by cefepime regardless    # Acute on chronic anemia- unexplained but likely related to phlebotomy, poor nutrition, critical illness bone marrow dysfunction,  stable after 1 unit  01/23/2019 - hgb 7.1gm% +. No overt bleeding  Plan  - - PRBC for hgb </= 6.9gm%    - exceptions are   -  if ACS susepcted/confirmed then transfuse for hgb </= 8.0gm%,  or    -  active bleeding with hemodynamic instability, then transfuse regardless of hemoglobin value   At at all times try to transfuse 1 unit prbc as possible with exception of active hemorrhage     # Acute on chronic metabolic encephalopathy, myriad of reasons for this.  01/23/2019 - awake an followed 1 command but not at baseline. 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  Bone biopsy not possible at this time due to Covid status and fraility.  Plan  - linezold  - cefepime - wound care - duration of abx need to be established  # Severe protein calorie malnutrition present on admission-   .01/23/2019 - diarrhae and bloating with TF 24h ago and tf and lactulose stopped but still with diarhea  Plan  - - continue TF at reduced rate o 30cc/h  - check KHIB  # Elevated ammonia- probably related to muscle breakdown  01/23/2019 -lactulose stopped 24h ago   Plan - monitor of  lactulose  # Elevated GGT, abnormal CT appearance of gallbladder 01/11/2001  - 2. Findings which could be suggestive of acute cholecystitis with hyperenhancement of the gallbladder wall, pericholecystic fluid and pericholecystic inflammatory changes. Inflammatory changes extend around the liver and into the right pericolic gutter. If further evaluation is required, would recommend right upper quadrant ultrasound   01/23/2019 - improving alk phos as of 24h ago.   Plan  - monitor - high risk fo rany procedure    # Quadriplegia/SAcral decub related to prior spinal cord injury with sacral decub  Plan  - dc gabapentin for chronic pain baeline med du eto AKI (can restart later if needed) - wound care for sacral decub - do opiopid iv prn if needed    # Multiorgan failure approaching end of life- palliative following, NO CPR But full medical care + . Mom has not vseen patien tin 1 year  Plan  -palliative care doing  family/mom face-to-face physical visit to do goals of care     Best practice:  Diet: TF Pain/Anxiety/Delirium protocol (if indicated): Hold given encephalopathy VAP protocol (if indicated): Not needed right now DVT prophylaxis: heparin gtt ->  change to heparin sq GI prophylaxis: protonix Glucose control: SSI  Mobility: BR  Code Status: January 21, 2019: Partial code - advised mom she is not  A good candidate for CRRt/HD. Mom asked [name] to pray for daughter and says she misses her.    Family:: mom on phone. Mom says lasr seen a year ago (2015 quad after fall from deck) . Mom  updated again 01/22/2019. Given overall poor prognosis. Advised palliative meet with physical visit from mom to help connect with daughter who she has seen only once I past year   Disposition: ICU.  CCM back primary  ATTESTATION & SIGNATURE     Dr. Brand Males, M.D., [facility].C.P Pulmonary and Critical Care Medicine Staff Physician [place] [facility] Pager: [phone], If no answer or between  15:00h - 7:00h: call [phone]  01/23/2019 11:28 AM     LABS    PULMONARY Recent Labs  Lab 01/18/19 2155 01/20/19 1210 01/20/19 2043 01/23/19 0318  PHART 7.236* 7.083* 7.219* 7.483*  PCO2ART 65.0* 52.4* 41.0 34.8  PO2ART 73.0* 67.0* 92.0 54.0*  HCO3 27.6 15.6* 17.0* 26.2  TCO2 30 17* 18* 27  O2SAT 91.0 84.0 96.0 91.0    CBC Recent Labs  Lab 01/22/19 0626 01/22/19 0807 01/23/19 0318 01/23/19 0454  HGB QUESTIONABLE RESULTS, RECOMMEND RECOLLECT TO VERIFY 7.1* 9.2* 7.2*  HCT QUESTIONABLE RESULTS, RECOMMEND RECOLLECT TO VERIFY 22.4* 27.0* 22.8*  WBC QUESTIONABLE RESULTS, RECOMMEND RECOLLECT TO VERIFY 8.5  --  12.6*  PLT QUESTIONABLE RESULTS, RECOMMEND RECOLLECT TO VERIFY 282  --  250    COAGULATION Recent Labs  Lab 01/22/19 0626 01/22/19 0807 01/23/19 0454  INR QUESTIONABLE RESULTS, RECOMMEND RECOLLECT TO VERIFY 1.6* 1.5*    CARDIAC  No results for input(s): TROPONINI in the last 168 hours. No results for input(s): PROBNP in the last 168 hours.   CHEMISTRY Recent Labs  Lab 01/18/19 0323 01/18/19 1322 01/18/19 1844 01/19/19 0417 01/19/19 1821 01/20/19 0500 01/20/19 0500  01/20/19 1210 01/20/19 1256 01/20/19 2043 01/21/19 0545 01/21/19 1108 01/22/19 0807 01/23/19 0318 01/23/19 0454  NA 135  --   --   --  138 140  --  143  --  143  --  139 142 143  --   K 3.2*  --   --   --  4.2 5.0   < > 3.8  --  3.3*  --  2.8* 2.4* 3.0*  --   CL 111  --   --   --  115* 115*  --   --   --   --   --  109 109  --   --   CO2 17*  --   --   --  16* 16*  --   --   --   --   --  21* 25  --   --   GLUCOSE 142*  --   --   --  201* 120*  --   --   --   --   --  359* 354*  --   --   BUN 51*  --   --   --  87* 103*  --   --   --   --   --  98* 104*  --   --   CREATININE 0.53  --   --   --  0.94 1.04*  --   --  1.12*  --  1.21* 1.19* 1.63*  --  1.33*  CALCIUM 8.7*  --   --   --  9.2 7.9*  --   --   --   --   --  8.2* 7.7*  --   --   MG  --  1.5* 1.4* 1.6* 2.2  --   --   --   --   --   --   --   --   --  1.6*  PHOS  --  3.0 2.5 2.8 2.9  --   --   --   --   --   --   --   --   --  <1.0*   < > = values in this interval not displayed.   Estimated Creatinine Clearance: 40.9 mL/min (A) (by C-G formula based on SCr of 1.33 mg/dL (H)).   LIVER Recent Labs  Lab 01/18/19 0323 01/19/19 1821 01/20/19 0500 01/21/19 1108 01/22/19 0626 01/22/19 0807 01/23/19 0454  AST 39 39 42* 44*  --  77*  76*  --   ALT _0 --  38  38  --   ALKPHOS 825* 615* 632* 1,527*  --  1,486*  1,304*  --   BILITOT 1.3* 1.2 1.2 1.7*  --  1.9*  2.2*  --   PROT 6.2* 6.4* 6.3* 5.3*  --  4.8*  4.8*  --   ALBUMIN 1.8* 3.0* 2.6* 1.8*  --  1.6*  1.7*  --   INR  --   --   --   --  QUESTIONABLE RESULTS, RECOMMEND RECOLLECT TO VERIFY 1.6* 1.5*     INFECTIOUS Recent Labs  Lab 01/20/19 1256 01/21/19 0545 01/22/19 0807  LATICACIDVEN 0.8  --   --   PROCALCITON 0.42 0.30 0.60     ENDOCRINE CBG (last 3)  Recent Labs    01/22/19 2336 01/23/19 0457 01/23/19 0708  GLUCAP 153* 89 78         IMAGING x48h  - image(s) personally visualized  -   highlighted in bold No results found.

## 2019-01-23 NOTE — Progress Notes (Signed)
[facility] Progress Note Patient Name: Angela Page DOB: 04-07-1970 MRN: [id number]   Date of Service  01/23/2019  HPI/Events of Note  K+ = 3.0, PO4--- < 1.0 and Creatinine = 1.33.  eICU Interventions  Will replace K+ and PO4---.     Intervention Category Major Interventions: Electrolyte abnormality - evaluation and management  Lailie Smead [place] 01/23/2019, 6:28 AM

## 2019-01-23 NOTE — Progress Notes (Addendum)
Daily Progress Note   Patient Name: Angela Page       Date: 01/23/2019 DOB: Sep 08, 1970  Age: 49 y.o. MRN#: [id number] Attending Physician: Brand Males, MD Primary Care Physician: Default, Provider, MD Admit Date: 01/02/2019  Reason for Consultation/Follow-up: Establishing goals of care  Subjective: Met with mother Angela Page [facility]) prior to taking her to visit Angela Page.  Angela Page lives at home with Bolivar 5 yo daughter.  Shawndrea also has a brother.  The family is very close.  I explained to Angela Page that Lameisha has no reserve left.  Her suprapubic catheter infection and bone infections are requiring very strong antibiotics.  These antibiotics are very hard on Damira's kidneys.   Braydee is so fragile that our treatment for one problem is making another one worse.  She is not a dialysis candidate.  Her mother understands.  We discussed the build up of fluid in her system that is causing breathing problems to worsen.  I shared that "coding" Cleona at this point would no benefit her and would only harm her.  We want Shamila to improve but we will not code her fragile body at this point.  Her mother understands and is hopeful that Atalie's kidneys will improve.  We talked about preparing for Faria's passing.  I advised please do what you can to prepare Jerrie's daughter and brother. Angela Page nodded with understanding and then went to visit Angela Page   Assessment: Patient tremendously fragile.  Awake but not responsive.  On the vent.  Creatinine is improved today.   Patient Profile/HPI:  50 y.o. female  with past medical history of quadriplegia, tracheostomy, PEG, suprapubic catheter, stage IV sacral decubitus and osteomyelitis in several bones who was admitted on  01/09/2019 with COVID 19 infection and cellulitis on her back.  Imaging showed her suprapubic catheter had punctured her posterior bladder wall leaving fluid in the pelvis and osteomyelitis had progressed into her fibula.   Length of Stay: 11  Current Medications: Scheduled Meds:  . chlorhexidine  15 mL Mouth Rinse BID  . Chlorhexidine Gluconate Cloth  6 each Topical Daily  . heparin injection (subcutaneous)  5,000 Units Subcutaneous Q8H  . hydrocortisone sod succinate (SOLU-CORTEF) inj  50 mg Intravenous Q12H  . insulin aspart  0-24 Units Subcutaneous Q4H  . insulin aspart  5 Units Subcutaneous  TID WC  . insulin detemir  15 Units Subcutaneous QHS  . mouth rinse  15 mL Mouth Rinse 10 times per day  . midodrine  10 mg Per Tube TID WC  . mirabegron ER  50 mg Oral Daily  . multivitamin  15 mL Per Tube Daily  . nutrition supplement (JUVEN)  1 packet Per Tube BID BM  . pantoprazole sodium  40 mg Per Tube Daily  . ramelteon  8 mg Oral QHS  . sodium chloride flush  10-40 mL Intracatheter Q12H    Continuous Infusions: . sodium chloride    . ceFEPime (MAXIPIME) IV Stopped (01/23/19 9030)  . feeding supplement (VITAL AF 1.2 CAL) 1,000 mL (01/23/19 1200)  . linezolid (ZYVOX) IV Stopped (01/23/19 1124)  . sodium bicarbonate 150 mEq in dextrose 5% 1000 mL 10 mL/hr at 01/23/19 1152    PRN Meds: sodium chloride, sodium chloride flush   Vital Signs: BP 94/67 (BP Location: Left Arm)   Pulse 87   Temp 98.4 F (36.9 C) (Axillary)   Resp (!) 22   Ht '5\' 2"'$  (1.575 m)   Wt 55.1 kg   LMP 05/27/2016 (Within Days) Comment: Irregular periods since October 2015.  SpO2 97%   BMI 22.22 kg/m  SpO2: SpO2: 97 % O2 Device: O2 Device: Ventilator O2 Flow Rate: O2 Flow Rate (L/min): 6 L/min  Intake/output summary:   Intake/Output Summary (Last 24 hours) at 01/23/2019 1323 Last data filed at 01/23/2019 1200 Gross per 24 hour  Intake 2938.15 ml  Output 975 ml  Net 1963.15 ml   LBM: Last BM Date:  01/23/19 Baseline Weight: Weight: 46.5 kg Most recent weight: Weight: 55.1 kg       Palliative Assessment/Data: 10%    Flowsheet Rows     Most Recent Value  Intake Tab  Referral Department  Hospitalist  Unit at Time of Referral  ER  Palliative Care Primary Diagnosis  Sepsis/Infectious Disease  Date Notified  01/14/19  Palliative Care Type  Return patient Palliative Care  Reason for referral  Clarify Goals of Care  Date of Admission  12/27/2018  Date first seen by Palliative Care  01/16/19  # of days Palliative referral response time  2 Day(s)  # of days IP prior to Palliative referral  2  Clinical Assessment  Psychosocial & Spiritual Assessment  Palliative Care Outcomes      Patient Active Problem List   Diagnosis Date Noted  . COVID-19   . Osteomyelitis of left fibula ([place])   . Palliative care encounter   . Hyperkalemia 01/13/2019  . Sepsis ([place]) 01/05/2019  . SBO (small bowel obstruction) ([place]) 08/10/2018  . Rectus sheath hematoma, initial encounter   . Acute encephalopathy 07/20/2018  . Hypercalcemia 06/23/2018  . Thrombocytosis ([place]) 06/22/2018  . Chronic respiratory failure with hypoxia ([place])   . Acute on chronic respiratory failure ([place])   . Quadriplegia and quadriparesis ([place])   . At high risk for severe sepsis   . Decubitus ulcer of buttock, unstageable ([place])   . Lactic acid acidosis 12/16/2017  . Suprapubic catheter ([place]) 12/16/2017  . Pressure injury of skin 07/17/2017  . Kidney stone 07/16/2017  . Right ureteral stone 03/10/2017  . Complicated UTI (urinary tract infection) 03/09/2017  . Ineffective airway clearance   . Sepsis secondary to UTI ([place]) 06/30/2016  . Autonomic neuropathy 06/10/2016  . Presence of permanent cardiac pacemaker 06/10/2016  . 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 Chronically on opiate therapy 06/10/2016  .  PEG (percutaneous endoscopic gastrostomy) status ([place]) 06/10/2016  . Pulmonary hypertension ([place])  06/10/2016  . Hypoalbuminemia 06/10/2016  . Peripheral edema   . Tracheostomy dependence ([place])   . Chronic obstructive pulmonary disease ([place])   . Chronic diastolic CHF (congestive heart failure) ([place]) 01/23/2016  . Tracheostomy status ([place])   . Esophageal dysphagia   . Malnutrition of moderate degree 11/01/2015  . Tobacco abuse 10/31/2015  . Asthma 10/31/2015  . History of pneumonia 10/08/2015  . Pressure ulcer of contiguous region involving buttock and hip, stage 4 ([place]) 02/26/2015  . Symptomatic anemia 02/25/2015  . Altered mental status 10/29/2014  . Acute metabolic encephalopathy 00/37/0488  . Leukocytosis 10/23/2014  . Hypokalemia 10/23/2014  . Decubitus ulcer of sacral region, stage 4 ([place]) 10/23/2014  . Anemia, iron deficiency 10/23/2014  . Quadriplegia ([place]) 10/22/2014  . Depression   . Protein-calorie malnutrition, severe ([place]) 09/05/2014  . 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 Recurrent UTI 03/31/2014  . Pressure ulcer of coccygeal region, stage 4 ([place]) 01/06/2014  . Stage 4 skin ulcer of sacral region ([place]) 01/06/2014  . Neuropathic pain 11/30/2013  . Paraplegia following spinal cord injury ([place]) 11/30/2013  . PEG (percutaneous endoscopic gastrostomy) adjustment/replacement/removal ([place]) 11/30/2013  . Spinal cord injury, cervical region ([place]) 11/30/2013  . Chronic pain due to injury 11/30/2013  . S/P cervical spinal fusion 10/27/2013  . Tracheostomy care ([place]) 10/27/2013  . 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 SCI (spinal cord injury) 10/11/2013    Palliative Care Plan    Recommendations/Plan:  DNR.  If she arrests do not resuscitate.    Mother/family will remain hopeful for improvement.  They understand she is not a hemodialysis candidate  PMT will continue to follow with you.  Goals of Care and Additional Recommendations:  Limitations on Scope of Treatment: Full Scope Treatment  Code Status:  DNR  Prognosis:   Unable to  determine likely some number of weeks if full support is continued.   Discharge Planning:  To Be Determined  Care plan was discussed with RN, CCM MD, mother  Thank you for allowing the Palliative Medicine Team to assist in the care of this patient.  Total time spent:  60 min     Greater than 50%  of this time was spent counseling and coordinating care related to the above assessment and plan. The above conversation was completed via telephone due to the restrictions during the COVID-19 pandemic. Thorough chart review and discussion with necessary members of the care team was completed as part of assessment. All issues were discussed and addressed but no physical exam was performed.   Florentina Jenny, PA-C Palliative Medicine  Please contact Palliative MedicineTeam phone at [phone] for questions and concerns between 7 am - 7 pm.   Please see AMION for individual provider pager numbers.

## 2019-01-24 DIAGNOSIS — L89154 Pressure ulcer of sacral region, stage 4: Secondary | ICD-10-CM

## 2019-01-24 DIAGNOSIS — R748 Abnormal levels of other serum enzymes: Secondary | ICD-10-CM

## 2019-01-24 DIAGNOSIS — J9600 Acute respiratory failure, unspecified whether with hypoxia or hypercapnia: Secondary | ICD-10-CM

## 2019-01-24 LAB — HEPATIC FUNCTION PANEL
ALT: 59 U/L — ABNORMAL HIGH (ref 0–44)
AST: 112 U/L — ABNORMAL HIGH (ref 15–41)
Albumin: 1.5 g/dL — ABNORMAL LOW (ref 3.5–5.0)
Alkaline Phosphatase: 1342 U/L — ABNORMAL HIGH (ref 38–126)
Bilirubin, Direct: 1.1 mg/dL — ABNORMAL HIGH (ref 0.0–0.2)
Indirect Bilirubin: 0.8 mg/dL (ref 0.3–0.9)
Total Bilirubin: 1.9 mg/dL — ABNORMAL HIGH (ref 0.3–1.2)
Total Protein: 5.3 g/dL — ABNORMAL LOW (ref 6.5–8.1)

## 2019-01-24 LAB — CBC WITH DIFFERENTIAL/PLATELET
Abs Immature Granulocytes: 0.05 10*3/uL (ref 0.00–0.07)
Basophils Absolute: 0 10*3/uL (ref 0.0–0.1)
Basophils Relative: 0 %
Eosinophils Absolute: 0 10*3/uL (ref 0.0–0.5)
Eosinophils Relative: 0 %
HCT: 24 % — ABNORMAL LOW (ref 36.0–46.0)
Hemoglobin: 7.6 g/dL — ABNORMAL LOW (ref 12.0–15.0)
Immature Granulocytes: 1 %
Lymphocytes Relative: 10 %
Lymphs Abs: 0.8 10*3/uL (ref 0.7–4.0)
MCH: 26 pg (ref 26.0–34.0)
MCHC: 31.7 g/dL (ref 30.0–36.0)
MCV: 82.2 fL (ref 80.0–100.0)
Monocytes Absolute: 0.3 10*3/uL (ref 0.1–1.0)
Monocytes Relative: 4 %
Neutro Abs: 6.7 10*3/uL (ref 1.7–7.7)
Neutrophils Relative %: 85 %
Platelets: 248 10*3/uL (ref 150–400)
RBC: 2.92 MIL/uL — ABNORMAL LOW (ref 3.87–5.11)
RDW: 26.2 % — ABNORMAL HIGH (ref 11.5–15.5)
WBC: 7.9 10*3/uL (ref 4.0–10.5)
nRBC: 0.3 % — ABNORMAL HIGH (ref 0.0–0.2)

## 2019-01-24 LAB — GLUCOSE, CAPILLARY
Glucose-Capillary: 94 mg/dL (ref 70–99)
Glucose-Capillary: 96 mg/dL (ref 70–99)
Glucose-Capillary: 181 mg/dL — ABNORMAL HIGH (ref 70–99)
Glucose-Capillary: 205 mg/dL — ABNORMAL HIGH (ref 70–99)
Glucose-Capillary: 111 mg/dL — ABNORMAL HIGH (ref 70–99)

## 2019-01-24 LAB — CREATININE, SERUM
Creatinine, Ser: 1.33 mg/dL — ABNORMAL HIGH (ref 0.44–1.00)
GFR calc Af Amer: 55 mL/min — ABNORMAL LOW (ref >60)
GFR calc non Af Amer: 47 mL/min — ABNORMAL LOW (ref >60)

## 2019-01-24 LAB — MAGNESIUM: Magnesium: 1.6 mg/dL — ABNORMAL LOW (ref 1.7–2.4)

## 2019-01-24 LAB — PHOSPHORUS: Phosphorus: 2.4 mg/dL — ABNORMAL LOW (ref 2.5–4.6)

## 2019-01-24 MED ORDER — OXYCODONE-ACETAMINOPHEN 5-325 MG PO TABS
1.0000 | ORAL_TABLET | Freq: Three times a day (TID) | ORAL | Status: DC | PRN
  Administered 2019-01-24 – 2019-01-28 (×5): 1
  Filled 2019-01-24 (×5): qty 1

## 2019-01-24 MED ORDER — POTASSIUM PHOSPHATES 15 MMOLE/5ML IV SOLN
30.0000 mmol | Freq: Once | INTRAVENOUS | Status: AC
  Administered 2019-01-24: 30 mmol via INTRAVENOUS
  Filled 2019-01-24: qty 10

## 2019-01-24 MED ORDER — OXYCODONE-ACETAMINOPHEN 5-325 MG PO TABS: 1.0000 | ORAL_TABLET | Freq: Three times a day (TID) | ORAL | Status: DC | PRN

## 2019-01-24 MED ORDER — MAGNESIUM SULFATE 2 GM/50ML IV SOLN
2.0000 g | Freq: Once | INTRAVENOUS | Status: AC
  Administered 2019-01-24: 2 g via INTRAVENOUS
  Filled 2019-01-24: qty 50

## 2019-01-24 MED ORDER — POTASSIUM CHLORIDE 20 MEQ/15ML (10%) PO SOLN
40.0000 meq | Freq: Once | ORAL | Status: AC
  Administered 2019-01-24: 40 meq
  Filled 2019-01-24: qty 30

## 2019-01-24 NOTE — Plan of Care (Signed)
  Problem: Activity: Goal: Risk for activity intolerance will decrease Outcome: Not Applicable   Problem: Nutrition: Goal: Adequate nutrition will be maintained Outcome: Progressing   Problem: Elimination: Goal: Will not experience complications related to bowel motility Outcome: Progressing Goal: Will not experience complications related to urinary retention Outcome: Progressing

## 2019-01-24 NOTE — Progress Notes (Signed)
NAME:  Angela Page, MRN:  [id number], DOB:  Nov 20, 1970, LOS: 12 ADMISSION DATE:  12/31/2018, CONSULTATION DATE:  01/18/19 REFERRING MD:  Neysa Bonito, CHIEF COMPLAINT:  Dyspnea   Brief History    49 year old quadriplegic presenting with osteomyelitis related to multiple pressure injuries.  Incidentally found to be Covid positive.  She is chronically on 3 L of oxygen through her tracheostomy.  She is not vent dependent.  Has a bunch of issues: (1) Anemia, unexplained, AC stopped until yesterday, transfused 1 unit (2) Osteomyelitis of hips/sacrum- on empiric abx, ortho does not want to bone biopsy, duration and narrowing TBD (3) Worsening hypoxemia- related to covid and possibly aspiration (4) Malpositioned suprapubic catheter- replaced by urology, no evidence of leak on CT (5) UTI vs. colonization (6) Anxiety/insomnia requiring intermittent benzodiazepines (7) Elevated Alk phos related to bony involvement/osteo (8) acute on chronic hypotension  PCCM consulted as patient deteriorated further from respiratory standpoint requiring suctioning to bring back up saturations.   Past Medical History    has a past medical history of Acute on chronic respiratory failure with hypoxia ([place]), Anasarca (06/10/2016), Asthma, At high risk for severe sepsis, Cocaine use (10/08/2013), Decubitus ulcer of buttock, unstageable ([place]), Depression, Diabetes mellitus without complication ([place]), GERD (gastroesophageal reflux disease), HCAP (healthcare-associated pneumonia) (06/09/2016), Hepatitis, Kidney stone, Lobar pneumonia ([place]), Overdose of opiate or related narcotic ([place]) (09/02/2014), Pacemaker, Pleurisy, Polysubstance dependence including opioid type drug, episodic abuse ([place]) (10/27/2013), Protein calorie malnutrition ([place]) (10/31/2015), Quadriparesis ([place]), Quadriplegia and quadriparesis ([place]), Quadriplegia, C5-C7 incomplete ([place]) (10/08/2013), Stage IV pressure ulcer of sacral region ([place]) (10/31/2015), and  Tracheostomy status ([place]).   has a past surgical history that includes Appendectomy; Cardiac surgery; I & D extremity (10/28/2011); ir generic historical (11/23/2015); Back surgery (10/08/2013); Tracheostomy; Gastrostomy (11/23/2015); Insertion of suprapubic catheter (N/A, 07/28/2016); Cystoscopy w/ ureteral stent placement (Right, 03/09/2017); Cystoscopy/ureteroscopy/holmium laser/stent placement (Right, 07/16/2017); Cystoscopy w/ ureteral stent removal (Right, 07/16/2017); Lithotripsy (08/2017); IR PATIENT EVAL TECH 0-60 MINS (12/18/2017); and IR REPLACE G-TUBE SIMPLE WO FLUORO (12/19/2017).   Significant Hospital Events   12/22/2018 -admit  12/27 - pall care consjult  12/28 - CTA - No PE   12/29 - ccm consul;t, limited code and icu transfer. . On iv heparin gtt for suspected PE  12/30 -  mag replaced. On IV heparin gtt. On o2 via trach at 4L [place]. On bair hugger. Bedsidde RN reports that 40 min aftrer transfer here patient has been fine and not needed vent since yesterday. Was on levophed few houirs yesterday but improved after midodrine yesterday. Currently sb p 92/map 65. WAs On scheduled albumin Q6h since yesterday -> I v heparin gtt stopped  12/.31 -  recallled by triad after MD noticed lethargy and tachypnea . BUN up at 103 and creat jumped. Has worsening metab and resp acidosis with ph 7.08 . dUplex LE negativ 01/19/2019- > back on vent. Renal consult: AKI due to CTA and vanc. Recommend med mgmt only. Poor CRRT/HD candidate per renal  1/1 -  on neo gtt and bic gtt d5water at 100cc/h and back on vent with 50% fio2. Has 4 cuffed Shiley trach. Making urine but output hard to measure due to suprapubic cath. EDema + per RN. Does not communicate much. Creat worse today. On TF  1/2- off pressors. On vent. RN says she is a bit more drowsy. TF making her continuous  stool despite flexiseal and c hanged to rectal pouch but even with that there is leak. TF at 60cc/h. Abd tense  with TF.  K dropped to 2.4,  BUN/Creat 104/1.6. RN concerned abou sacral decub  Consults:  ID, urology, wound care  Procedures:  12/24 [facility] replaced  Significant Diagnostic Tests:  CXR 12/29 IMPRESSION: Small left pleural effusion and basilar airspace disease have worsened since the most recent plain film. Small right pleural effusion and basilar airspace disease appear unchanged.  CT Chest 12/28 IMPRESSION: 1. No evidence of a pulmonary embolism. 2. Suspect pulmonary edema, likely on the basis of congestive heart failure, reflected by interstitial thickening, small bilateral effusions and patchy areas of ground-glass opacity. Infection should be considered if there are consistent clinical findings. 3. There is additional dependent opacity in the lower lobes consistent with atelectasis. 4. Two vessel coronary artery calcifications. Minor aortic atherosclerosis.  Micro Data:  12/23 - urine - proteus 12/23 - covid + 122/28 - MRSA PCR positive  Antimicrobials:  Vancomycin 12/23 >>  12/30 Cefepime 12/23 (proteus) uti >> Remdesivir 12/23 - 12/27 Doxycycline 12/27 >> 12/29 Linezolid 12/31 >>  Interim history/subjective:  Remains off pressors.  Made DNR per palliative care meeting yesterday On PRVC.  Objective   Blood pressure 96/65, pulse 80, temperature (!) 97.1 F (36.2 C), temperature source Axillary, resp. rate (!) 28, height _0  (1.575 m), weight 56 kg, last menstrual period 05/27/2016, SpO2 97 %.    Vent Mode: PRVC FiO2 (%):  [40 %] 40 % Set Rate:  [16 bmp] 16 bmp Vt Set:  [400 mL] 400 mL PEEP:  [5 cmH20] 5 cmH20 Plateau Pressure:  [20 cmH20-27 cmH20] 20 cmH20   Intake/Output Summary (Last 24 hours) at 01/24/2019 0801 Last data filed at 01/24/2019 0700 Gross per 24 hour  Intake 2127.61 ml  Output 890 ml  Net 1237.61 ml   Filed Weights   01/22/19 0500 01/23/19 0500 01/24/19 0347  Weight: 55.2 kg 55.1 kg 56 kg    Gen:      No acute distress HEENT:  EOMI, sclera anicteric,  trach Neck:     No masses; no thyromegaly Lungs:    Clear to auscultation bilaterally; normal respiratory effort CV:         Regular rate and rhythm; no murmurs Abd:      + bowel sounds; soft, non-tender; no palpable masses, no distension Ext:    Contracted extremity Skin:      Warm and dry; no rash Neuro: Awake, does not follow commands   Resolved Hospital Problem list   N/A  Assessment & Plan:  Acute on chronic respiratory failure in the setting of COVID-19 pneumonia, aspiration, inability to secretion Continue full vent support, pressure support trials as tolerated.   COVID-19 pneumonia, aspiration Finish course of dexamethasone, remdesivir. Circulatory shock with background chronic vasoplegia (s/p albumin 12/29-12/30/2020) Off pressors.  Continue midodrine, hydrocortisone for now.  Can start weaning steroids  AKI, hypokalemia, hypomagnesemia Off bicarb drip Replete lites.  Follow urine output and creatinine  Suprapubic catheter with initial concern for posterior bladder wall perforation, Proteus colonization of suprapubic catheter-  subsequent CT abdomen pelvis did not show a leak, suprapubic catheter changed on  Monitor Continue cefepime  Severe multisite osteomyelitis related to pressure sores.  Bone biopsy not possible at this time due to Covid status and fraility. Continue linezolid, cefepime  Severe protein calorie malnutrition present on admission-  Continue tube feeds  Elevated LFTs.  No evidence of cholecystitis on recent HIDA scan Monitoring  Quadriplegia/SAcral decub related to prior spinal cord injury with sacral decub Wound care, pain control  Goals  of care No CPR.  Palliative care:  Best practice:  Diet: TF Pain/Anxiety/Delirium protocol (if indicated): Hold given encephalopathy VAP protocol (if indicated): Not needed right now DVT prophylaxis: heparin gtt -> change to heparin sq GI prophylaxis: protonix Glucose control: SSI Mobility: BR  LABS     PULMONARY Recent Labs  Lab 01/18/19 2155 01/20/19 1210 01/20/19 2043 01/23/19 0318 01/23/19 1541  PHART 7.236* 7.083* 7.219* 7.483* 7.444  PCO2ART 65.0* 52.4* 41.0 34.8 37.3  PO2ART 73.0* 67.0* 92.0 54.0* 57.0*  HCO3 27.6 15.6* 17.0* 26.2 25.6  TCO2 30 17* 18* 27 27  O2SAT 91.0 84.0 96.0 91.0 91.0    CBC Recent Labs  Lab 01/22/19 0807 01/23/19 0454 01/23/19 1541 01/24/19 0338  HGB 7.1* 7.2* 9.2* 7.6*  HCT 22.4* 22.8* 27.0* 24.0*  WBC 8.5 12.6*  --  7.9  PLT 282 250  --  248    COAGULATION Recent Labs  Lab 01/22/19 0626 01/22/19 0807 01/23/19 0454  INR QUESTIONABLE RESULTS, RECOMMEND RECOLLECT TO VERIFY 1.6* 1.5*    CARDIAC  No results for input(s): TROPONINI in the last 168 hours. No results for input(s): PROBNP in the last 168 hours.   CHEMISTRY Recent Labs  Lab 01/18/19 0323 01/18/19 1322 01/18/19 1844 01/19/19 0417 01/19/19 1821 01/20/19 0500 01/20/19 1210 01/20/19 1256 01/20/19 2043 01/21/19 0545 01/21/19 1108 01/22/19 0807 01/23/19 0318 01/23/19 0454 01/23/19 1541 01/24/19 0338  NA 135   < >  --   --  138 140  --   --  143  --  139 142 143  --  142  --   K 3.2*   < >  --   --  4.2 5.0  --    < > 3.3*  --  2.8* 2.4* 3.0*  --  3.7  --   CL 111  --   --   --  115* 115*  --   --   --   --  109 109  --   --   --   --   CO2 17*  --   --   --  16* 16*  --   --   --   --  21* 25  --   --   --   --   GLUCOSE 142*  --   --   --  201* 120*  --   --   --   --  359* 354*  --   --   --   --   BUN 51*  --   --   --  87* 103*  --   --   --   --  98* 104*  --   --   --   --   CREATININE 0.53  --   --   --  0.94 1.04*   < >  --   --  1.21* 1.19* 1.63*  --  1.33*  --  1.33*  CALCIUM 8.7*  --   --   --  9.2 7.9*  --   --   --   --  8.2* 7.7*  --   --   --   --   MG  --   --  1.4* 1.6* 2.2  --   --   --   --   --   --   --   --  1.6*  --  1.6*  PHOS  --   --  2.5 2.8 2.9  --   --   --   --   --   --   --   --  <  1.0*  --  2.4*   < > = values in this  interval not displayed.   Estimated Creatinine Clearance: 40.9 mL/min (A) (by C-G formula based on SCr of 1.33 mg/dL (H)).   LIVER Recent Labs  Lab 01/20/19 0500 01/21/19 1108 01/22/19 0626 01/22/19 0807 01/23/19 0454 01/23/19 1150 01/24/19 0338  AST 42* 44*  --  77*  76*  --  115* 112*  ALT 28 26  --  38  38  --  52* 59*  ALKPHOS 632* 1,527*  --  1,486*  1,304*  --  1,270* 1,342*  BILITOT 1.2 1.7*  --  1.9*  2.2*  --  2.1* 1.9*  PROT 6.3* 5.3*  --  4.8*  4.8*  --  5.1* 5.3*  ALBUMIN 2.6* 1.8*  --  1.6*  1.7*  --  1.5* 1.5*  INR  --   --  QUESTIONABLE RESULTS, RECOMMEND RECOLLECT TO VERIFY 1.6* 1.5*  --   --      INFECTIOUS Recent Labs  Lab 01/20/19 1256 01/21/19 0545 01/22/19 0807  LATICACIDVEN 0.8  --   --   PROCALCITON 0.42 0.30 0.60     ENDOCRINE CBG (last 3)  Recent Labs    01/23/19 2325 01/24/19 0344 01/24/19 0721  GLUCAP 117* 94 96     The patient is critically ill with multiple organ system failure and requires high complexity decision making for assessment and support, frequent evaluation and titration of therapies, advanced monitoring, review of radiographic studies and interpretation of complex data.   Critical Care Time devoted to patient care services, exclusive of separately billable procedures, described in this note is 35 minutes.   Marshell Garfinkel MD [facility] Please see [email or web address] for pager details.  01/24/2019, 9:26 AM

## 2019-01-24 NOTE — Progress Notes (Signed)
Pharmacy Antibiotic Note  Angela Page is a 49 y.o. female admitted on 01/02/2019 with sepsis due to osteomyelitis and proteus UTI.  Pharmacy has been consulted for cefepime dosing. Vancomycin changed to linezolid per ID due to AKI. SCr trend back down to 1.33 today.  Plan: Cefepime 2g IV q12h Zyvox 600mg  IV q12h Monitor clinical progress, c/s, renal function F/u de-escalation plan/LOT  Height: 5\' 2"  (157.5 cm) Weight: 123 lb 7.3 oz (56 kg) IBW/kg (Calculated) : 50.1  Temp (24hrs), Avg:97.8 F (36.6 C), Min:97.1 F (36.2 C), Max:98.4 F (36.9 C)  Recent Labs  Lab 01/18/19 2130 01/19/19 1821 01/20/19 0500 01/20/19 1256 01/21/19 0545 01/21/19 1108 01/22/19 0626 01/22/19 0807 01/23/19 0454 01/24/19 0338  WBC  --   --   --   --  16.8*  --  QUESTIONABLE RESULTS, RECOMMEND RECOLLECT TO VERIFY 8.5 12.6* 7.9  CREATININE  --  0.94   < > 1.12* 1.21* 1.19*  --  1.63* 1.33* 1.33*  LATICACIDVEN  --   --   --  0.8  --   --   --   --   --   --   VANCOPEAK 57*  --   --   --   --   --   --   --   --   --   VANCORANDOM  --  43  --   --   --   --   --   --   --   --    < > = values in this interval not displayed.    Estimated Creatinine Clearance: 40.9 mL/min (A) (by C-G formula based on SCr of 1.33 mg/dL (H)).    Allergies  Allergen Reactions  . Penicillins Hives    Has patient had a PCN reaction causing immediate rash, facial/tongue/throat swelling, SOB or lightheadedness with hypotension: Yes Has patient had a PCN reaction causing severe rash involving mucus membranes or skin necrosis: Yes Has patient had a PCN reaction that required hospitalization No Has patient had a PCN reaction occurring within the last 10 years: No-childhood allergy If all of the above answers are "NO", then may proceed with Cephalosporin  Tolerated cephalosporins in past  . Erythromycin Nausea And Vomiting  . Nitrofurantoin Monohyd Macro Nausea And Vomiting  . Oxybutynin Other (See Comments)   Dries mouth out     Elicia Lamp, PharmD, BCPS Please check AMION for all [place] contact numbers Clinical Pharmacist 01/24/2019 9:41 AM

## 2019-01-24 NOTE — Progress Notes (Addendum)
Daily Progress Note   Patient Name: Angela Page       Date: 01/24/2019 DOB: [birth year]  Age: 49 y.o. MRN#: [id number] Attending Physician: Marshell Garfinkel, MD Primary Care Physician: Default, Provider, MD Admit Date: 01/04/2019  Reason for Consultation/Follow-up: Establishing goals of care and Psychosocial/spiritual support  Chart reviewed.  Discussed status with bedside ICU RN.  Patient is on the vent, awake, has non-purposeful movement.  Subjective: Talked with Angela Page (Mother) on the phone.  She was happy to have seen Angela Page yesterday and felt as though Angela Page knew she was there.   I explained that Angela Page was still on the vent, and she was having non-purposeful movement.  Angela Page listened.  I explained our concern that given Angela Page's multiple and severe infections - things were going to continue this way for Angela Page.  Angela Page acknowledged my concern but did not give an opinion.    I encouraged Angela Page to Camera in thru [place] and allow Angela Page daughter to see her.  Angela Page agreed but when I tried to actually schedule a time Angela Page backed away from the idea.    Assessment: Chronically ill debilitated young woman with multiple severe infections that are unlikely to improve.   Patient Profile/HPI:  49 y.o. female  with past medical history of quadriplegia, tracheostomy, PEG, suprapubic catheter, stage IV sacral decubitus and osteomyelitis in several bones who was admitted on 12/23/2018 with COVID 19 infection and cellulitis on her back.  Imaging showed her suprapubic catheter had punctured her posterior bladder wall leaving fluid in the pelvis and osteomyelitis had progressed into her fibula.   Length of Stay: 12  Current Medications: Scheduled Meds:  . chlorhexidine  15 mL  Mouth Rinse BID  . Chlorhexidine Gluconate Cloth  6 each Topical Daily  . heparin injection (subcutaneous)  5,000 Units Subcutaneous Q8H  . hydrocortisone sod succinate (SOLU-CORTEF) inj  50 mg Intravenous Q12H  . insulin aspart  0-24 Units Subcutaneous Q4H  . insulin aspart  5 Units Subcutaneous TID WC  . insulin detemir  15 Units Subcutaneous QHS  . mouth rinse  15 mL Mouth Rinse 10 times per day  . midodrine  10 mg Per Tube TID WC  . mirabegron ER  50 mg Oral Daily  . multivitamin  15 mL Per Tube Daily  . nutrition supplement (JUVEN)  1 packet Per Tube BID BM  . pantoprazole sodium  40 mg Per Tube Daily  . ramelteon  8 mg Oral QHS  . sodium chloride flush  10-40 mL Intracatheter Q12H    Continuous Infusions: . sodium chloride    . ceFEPime (MAXIPIME) IV Stopped (01/24/19 0714)  . feeding supplement (VITAL AF 1.2 CAL) 30 mL/hr at 01/23/19 1800  . linezolid (ZYVOX) IV 600 mg (01/24/19 0934)  . potassium PHOSPHATE IVPB (in mmol) 30 mmol (01/24/19 1112)    PRN Meds: sodium chloride, oxyCODONE-acetaminophen, sodium chloride flush    Vital Signs: BP 91/79   Pulse 80   Temp (!) 97.1 F (36.2 C) (Axillary)   Resp (!) 22   Ht 5\' 2"  (1.575 m)   Wt 56 kg   LMP 05/27/2016 (Within Days) Comment: Irregular periods since October 2015.  SpO2 98%   BMI 22.58 kg/m  SpO2: SpO2: 98 % O2 Device: O2 Device: Ventilator O2 Flow Rate: O2 Flow Rate (L/min): 6 L/min  Intake/output summary:   Intake/Output Summary (Last 24 hours) at 01/24/2019 1452 Last data filed at 01/24/2019 1300 Gross per 24 hour  Intake 1696.33 ml  Output 825 ml  Net 871.33 ml   LBM: Last BM Date: 01/24/19 Baseline Weight: Weight: 46.5 kg Most recent weight: Weight: 56 kg       Palliative Assessment/Data:  10%    Flowsheet Rows     Most Recent Value  Intake Tab  Referral Department  Hospitalist  Unit at Time of Referral  ER  Palliative Care Primary Diagnosis  Sepsis/Infectious Disease  Date Notified   01/14/19  Palliative Care Type  Return patient Palliative Care  Reason for referral  Clarify Goals of Care  Date of Admission  01/01/2019  Date first seen by Palliative Care  01/16/19  # of days Palliative referral response time  2 Day(s)  # of days IP prior to Palliative referral  2  Clinical Assessment  Psychosocial & Spiritual Assessment  Palliative Care Outcomes      Patient Active Problem List   Diagnosis Date Noted  . DNR (do not resuscitate)   . COVID-19   . Osteomyelitis of left fibula ([place])   . Palliative care encounter   . Hyperkalemia 01/13/2019  . Sepsis ([place]) 01/01/2019  . SBO (small bowel obstruction) ([place]) 08/10/2018  . Rectus sheath hematoma, initial encounter   . Acute encephalopathy 07/20/2018  . Hypercalcemia 06/23/2018  . Thrombocytosis ([place]) 06/22/2018  . Chronic respiratory failure with hypoxia ([place])   . Acute on chronic respiratory failure ([place])   . Quadriplegia and quadriparesis ([place])   . At high risk for severe sepsis   . Decubitus ulcer of buttock, unstageable ([place])   . Lactic acid acidosis 12/16/2017  . Suprapubic catheter ([place]) 12/16/2017  . Pressure injury of skin 07/17/2017  . Kidney stone 07/16/2017  . Right ureteral stone 03/10/2017  . Complicated UTI (urinary tract infection) 03/09/2017  . Ineffective airway clearance   . Sepsis secondary to UTI ([place]) 06/30/2016  . Autonomic neuropathy 06/10/2016  . Presence of permanent cardiac pacemaker 06/10/2016  . 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 Chronically on opiate therapy 06/10/2016  . PEG (percutaneous endoscopic gastrostomy) status ([place]) 06/10/2016  . Pulmonary hypertension ([place]) 06/10/2016  . Hypoalbuminemia 06/10/2016  . Peripheral edema   . Tracheostomy dependence ([place])   . Chronic obstructive pulmonary disease ([place])   . Chronic diastolic CHF (congestive heart failure) ([place]) 01/23/2016  . Tracheostomy status ([place])   .  Esophageal dysphagia   . Malnutrition of moderate  degree 11/01/2015  . Tobacco abuse 10/31/2015  . Asthma 10/31/2015  . History of pneumonia 10/08/2015  . Pressure ulcer of contiguous region involving buttock and hip, stage 4 ([place]) 02/26/2015  . Symptomatic anemia 02/25/2015  . Altered mental status 10/29/2014  . Acute metabolic encephalopathy [id number]  . Leukocytosis 10/23/2014  . Hypokalemia 10/23/2014  . Decubitus ulcer of sacral region, stage 4 ([place]) 10/23/2014  . Anemia, iron deficiency 10/23/2014  . Quadriplegia ([place]) 10/22/2014  . Depression   . Protein-calorie malnutrition, severe ([place]) 09/05/2014  . 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 Recurrent UTI 03/31/2014  . Pressure ulcer of coccygeal region, stage 4 ([place]) 01/06/2014  . Stage 4 skin ulcer of sacral region ([place]) 01/06/2014  . Neuropathic pain 11/30/2013  . Paraplegia following spinal cord injury ([place]) 11/30/2013  . PEG (percutaneous endoscopic gastrostomy) adjustment/replacement/removal ([place]) 11/30/2013  . Spinal cord injury, cervical region ([place]) 11/30/2013  . Chronic pain due to injury 11/30/2013  . S/P cervical spinal fusion 10/27/2013  . Tracheostomy care ([place]) 10/27/2013  . 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 SCI (spinal cord injury) 10/11/2013    Palliative Care Plan    Recommendations/Plan:  DNR, with full scope treatment.  PMT will follow up intermittently.  Please call our office if we are needed sooner.  Goals of Care and Additional Recommendations:  Limitations on Scope of Treatment: Full Scope Treatment  Code Status:  DNR  Prognosis:   Unable to determine given multiple severe infections, trach/peg/suprapubic cath, on the vent with COVID, she is at very high risk of acute decline and death.  Discharge Planning:  To Be Determined.    Care plan was discussed with bedside RN and mother  Thank you for allowing the Palliative Medicine Team to assist in the care of this patient.  Total time spent:  25 min.       Greater than 50%  of this time was spent counseling and coordinating care related to the above assessment and plan.  The above conversation was completed via telephone due to the restrictions during the COVID-19 pandemic. Thorough chart review and discussion with necessary members of the care team was completed as part of assessment. All issues were discussed and addressed but no physical exam was performed.  Florentina Jenny, PA-C Palliative Medicine  Please contact Palliative MedicineTeam phone at [phone] for questions and concerns between 7 am - 7 pm.   Please see AMION for individual provider pager numbers.

## 2019-01-25 ENCOUNTER — Inpatient Hospital Stay (HOSPITAL_COMMUNITY): Payer: Medicare Other

## 2019-01-25 DIAGNOSIS — M86659 Other chronic osteomyelitis, unspecified thigh: Secondary | ICD-10-CM | POA: Diagnosis present

## 2019-01-25 DIAGNOSIS — J9600 Acute respiratory failure, unspecified whether with hypoxia or hypercapnia: Secondary | ICD-10-CM

## 2019-01-25 LAB — GLUCOSE, CAPILLARY
Glucose-Capillary: 103 mg/dL — ABNORMAL HIGH (ref 70–99)
Glucose-Capillary: 109 mg/dL — ABNORMAL HIGH (ref 70–99)
Glucose-Capillary: 112 mg/dL — ABNORMAL HIGH (ref 70–99)
Glucose-Capillary: 132 mg/dL — ABNORMAL HIGH (ref 70–99)
Glucose-Capillary: 87 mg/dL (ref 70–99)
Glucose-Capillary: 98 mg/dL (ref 70–99)
Glucose-Capillary: 99 mg/dL (ref 70–99)

## 2019-01-25 LAB — COMPREHENSIVE METABOLIC PANEL
ALT: 53 U/L — ABNORMAL HIGH (ref 0–44)
AST: 83 U/L — ABNORMAL HIGH (ref 15–41)
Albumin: 1.5 g/dL — ABNORMAL LOW (ref 3.5–5.0)
Alkaline Phosphatase: 1366 U/L — ABNORMAL HIGH (ref 38–126)
Anion gap: 13 (ref 5–15)
BUN: 126 mg/dL — ABNORMAL HIGH (ref 6–20)
CO2: 23 mmol/L (ref 22–32)
Calcium: 7.2 mg/dL — ABNORMAL LOW (ref 8.9–10.3)
Chloride: 103 mmol/L (ref 98–111)
Creatinine, Ser: 1.46 mg/dL — ABNORMAL HIGH (ref 0.44–1.00)
GFR calc Af Amer: 49 mL/min — ABNORMAL LOW (ref >60)
GFR calc non Af Amer: 42 mL/min — ABNORMAL LOW (ref >60)
Glucose, Bld: 124 mg/dL — ABNORMAL HIGH (ref 70–99)
Potassium: 3.7 mmol/L (ref 3.5–5.1)
Sodium: 139 mmol/L (ref 135–145)
Total Bilirubin: 1.6 mg/dL — ABNORMAL HIGH (ref 0.3–1.2)
Total Protein: 5.2 g/dL — ABNORMAL LOW (ref 6.5–8.1)

## 2019-01-25 LAB — CBC WITH DIFFERENTIAL/PLATELET
Abs Immature Granulocytes: 0.04 10*3/uL (ref 0.00–0.07)
Basophils Absolute: 0 10*3/uL (ref 0.0–0.1)
Basophils Relative: 0 %
Eosinophils Absolute: 0 10*3/uL (ref 0.0–0.5)
Eosinophils Relative: 0 %
HCT: 25.6 % — ABNORMAL LOW (ref 36.0–46.0)
Hemoglobin: 8 g/dL — ABNORMAL LOW (ref 12.0–15.0)
Immature Granulocytes: 1 %
Lymphocytes Relative: 9 %
Lymphs Abs: 0.7 10*3/uL (ref 0.7–4.0)
MCH: 25.9 pg — ABNORMAL LOW (ref 26.0–34.0)
MCHC: 31.3 g/dL (ref 30.0–36.0)
MCV: 82.8 fL (ref 80.0–100.0)
Monocytes Absolute: 0.3 10*3/uL (ref 0.1–1.0)
Monocytes Relative: 3 %
Neutro Abs: 6.8 10*3/uL (ref 1.7–7.7)
Neutrophils Relative %: 87 %
Platelets: 231 10*3/uL (ref 150–400)
RBC: 3.09 MIL/uL — ABNORMAL LOW (ref 3.87–5.11)
RDW: 27 % — ABNORMAL HIGH (ref 11.5–15.5)
WBC: 7.8 10*3/uL (ref 4.0–10.5)
nRBC: 0.6 % — ABNORMAL HIGH (ref 0.0–0.2)

## 2019-01-25 LAB — PHOSPHORUS: Phosphorus: 4.1 mg/dL (ref 2.5–4.6)

## 2019-01-25 LAB — MAGNESIUM: Magnesium: 2.1 mg/dL (ref 1.7–2.4)

## 2019-01-25 LAB — HIGH SENSITIVITY CRP: CRP, High Sensitivity: 51.7 mg/L — ABNORMAL HIGH (ref 0.00–3.00)

## 2019-01-25 MED ORDER — WHITE PETROLATUM EX OINT
TOPICAL_OINTMENT | CUTANEOUS | Status: AC
  Administered 2019-01-25: 0.2
  Filled 2019-01-25: qty 28.35

## 2019-01-25 NOTE — Progress Notes (Signed)
Patient ID: Angela Page, female   DOB: 02/14/1970, 49 y.o.   MRN: [id number]          [facility] for Infectious Disease    Date of Admission:  01/01/2019   Day 14 cefepime        Day 6 of linezolid  Dr. Vaughan Browner asked for my opinion about the optimal duration of Angela Page's empiric antibiotic therapy.  Angela Page has a history of quadriplegia and chronic pressure sores over her sacrum and left lower leg.  She was admitted 2 weeks ago with COVID-19.  She was started on cefepime and vancomycin at time.  Imaging has shown extensive bony destruction of her sacrum and throughout her pelvis.  She also has evidence of left fibular osteomyelitis.  She was seen by orthopedics who recommended only medical management of her osteomyelitis.  Unfortunately, without biopsies and cultures to direct antibiotic therapy it will be extremely difficult to come up with a regimen that is likely to offer benefit.  In the past she has had sacral biopsies growing MSSA, Klebsiella, Enterobacter and Feingoldia.  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  He has had multiple admissions to the hospital and repeated treatment with broad-spectrum antibiotics.  Even if we were able to come up with a reasonable, effective and safe antibiotic regimen it is unlikely that this will change her outcome given the current inability to offload pressure, her poor nutritional status and probable recurrent fecal soiling of her wounds.  I am concerned that continuing even broader empiric antibiotic therapy to cover MRSA, multidrug resistant gram-negative rods and anaerobes is likely to confer more harm than benefit.  She has already had probable side effects of vancomycin with acute kidney injury.  Linezolid has significant risk of bone marrow suppression and optic neuritis, especially when used for more than 3 to 4 weeks.  I recommend stopping antibiotics now and continuing  discussions with her family about goals of care.  If there continues to be a strong push to do everything for her I would ask orthopedics to reconsider pelvic and left fibular bone biopsies to be sent for pathology, Gram stain, aerobic and anaerobic cultures before committing to further antibiotic therapy.         Michel Bickers, MD [facility] for Infectious [facility] [phone] pager   [phone] cell 01/25/2019, 11:11 AM

## 2019-01-25 NOTE — Progress Notes (Addendum)
NAME:  Angela Page, MRN:  [id number], DOB:  1970-05-16, LOS: 15 ADMISSION DATE:  12/26/2018, CONSULTATION DATE:  01/18/19 REFERRING MD:  Neysa Bonito, CHIEF COMPLAINT:  Dyspnea   Brief History    49 year old quadriplegic presenting with osteomyelitis related to multiple pressure injuries.  Incidentally found to be Covid positive.  She is chronically on 3 L of oxygen through her tracheostomy.  She is not vent dependent.  Has a bunch of issues: (1) Anemia, unexplained, AC stopped until yesterday, transfused 1 unit (2) Osteomyelitis of hips/sacrum- on empiric abx, ortho does not want to bone biopsy, duration and narrowing TBD (3) Worsening hypoxemia- related to covid and possibly aspiration (4) Malpositioned suprapubic catheter- replaced by urology, no evidence of leak on CT (5) UTI vs. colonization (6) Anxiety/insomnia requiring intermittent benzodiazepines (7) Elevated Alk phos related to bony involvement/osteo (8) acute on chronic hypotension  PCCM consulted as patient deteriorated further from respiratory standpoint requiring suctioning to bring back up saturations.  Past Medical History    has a past medical history of Acute on chronic respiratory failure with hypoxia ([place]), Anasarca (06/10/2016), Asthma, At high risk for severe sepsis, Cocaine use (10/08/2013), Decubitus ulcer of buttock, unstageable ([place]), Depression, Diabetes mellitus without complication ([place]), GERD (gastroesophageal reflux disease), HCAP (healthcare-associated pneumonia) (06/09/2016), Hepatitis, Kidney stone, Lobar pneumonia ([place]), Overdose of opiate or related narcotic ([place]) (09/02/2014), Pacemaker, Pleurisy, Polysubstance dependence including opioid type drug, episodic abuse ([place]) (10/27/2013), Protein calorie malnutrition ([place]) (10/31/2015), Quadriparesis ([place]), Quadriplegia and quadriparesis ([place]), Quadriplegia, C5-C7 incomplete ([place]) (10/08/2013), Stage IV pressure ulcer of sacral region ([place]) (10/31/2015), and  Tracheostomy status ([place]).   has a past surgical history that includes Appendectomy; Cardiac surgery; I & D extremity (10/28/2011); ir generic historical (11/23/2015); Back surgery (10/08/2013); Tracheostomy; Gastrostomy (11/23/2015); Insertion of suprapubic catheter (N/A, 07/28/2016); Cystoscopy w/ ureteral stent placement (Right, 03/09/2017); Cystoscopy/ureteroscopy/holmium laser/stent placement (Right, 07/16/2017); Cystoscopy w/ ureteral stent removal (Right, 07/16/2017); Lithotripsy (08/2017); IR PATIENT EVAL TECH 0-60 MINS (12/18/2017); and IR REPLACE G-TUBE SIMPLE WO FLUORO (12/19/2017).   Significant Hospital Events   12/22/2018 -admit  12/27 - palliative care consjult  12/28 - CTA - No PE   12/29 - ccm consul;t, limited code and icu transfer. . On iv heparin gtt for suspected PE  12/30 -  mag replaced. On IV heparin gtt. On o2 via trach at 4L [place]. On bair hugger. Bedsidde RN reports that 40 min aftrer transfer here patient has been fine and not needed vent since yesterday. Was on levophed few houirs yesterday but improved after midodrine yesterday. Currently sb p 92/map 65. WAs On scheduled albumin Q6h since yesterday -> I v heparin gtt stopped  12/.31 -  recallled by triad after MD noticed lethargy and tachypnea . BUN up at 103 and creat jumped. Has worsening metab and resp acidosis with ph 7.08 . dUplex LE negativ 01/19/2019- > back on vent. Renal consult: AKI due to CTA and vanc. Recommend med mgmt only. Poor CRRT/HD candidate per renal  1/1 -  on neo gtt and bic gtt d5water at 100cc/h and back on vent with 50% fio2. Has 4 cuffed Shiley trach. Making urine but output hard to measure due to suprapubic cath. EDema + per RN. Does not communicate much. Creat worse today. On TF  1/2- off pressors. On vent.  1/3 Made DNR after palliative care discussion.   Consults:  ID, urology, wound care  Procedures:  12/24 [facility] replaced  Significant Diagnostic Tests:  CXR 12/29 IMPRESSION: Small left  pleural effusion  and basilar airspace disease have worsened since the most recent plain film. Small right pleural effusion and basilar airspace disease appear unchanged.  CT Chest 12/28 IMPRESSION: 1. No evidence of a pulmonary embolism. 2. Suspect pulmonary edema, likely on the basis of congestive heart failure, reflected by interstitial thickening, small bilateral effusions and patchy areas of ground-glass opacity. Infection should be considered if there are consistent clinical findings. 3. There is additional dependent opacity in the lower lobes consistent with atelectasis. 4. Two vessel coronary artery calcifications. Minor aortic atherosclerosis.  Micro Data:  12/23 - urine - proteus 12/23 - covid + 122/28 - MRSA PCR positive  Antimicrobials:  Vancomycin 12/23 >>  12/30 Remdesivir 12/23 - 12/27 Doxycycline 12/27 >> 12/29  Cefepime 12/23 (proteus) uti >> Linezolid 12/31 >>  Interim history/subjective:  No acute events overnight.   Objective   Blood pressure (!) 83/67, pulse 80, temperature (!) 97 F (36.1 C), temperature source Axillary, resp. rate (!) 22, height '5\' 2"'$  (1.575 m), weight 56 kg, last menstrual period 05/27/2016, SpO2 100 %.    Vent Mode: PRVC FiO2 (%):  [40 %] 40 % Set Rate:  [16 bmp] 16 bmp Vt Set:  [400 mL] 400 mL PEEP:  [5 cmH20] 5 cmH20 Plateau Pressure:  [16 cmH20-22 cmH20] 22 cmH20   Intake/Output Summary (Last 24 hours) at 01/25/2019 0810 Last data filed at 01/25/2019 0700 Gross per 24 hour  Intake 1800.66 ml  Output 155 ml  Net 1645.66 ml   Filed Weights   01/22/19 0500 01/23/19 0500 01/24/19 0347  Weight: 55.2 kg 55.1 kg 56 kg   Gen:      No acute distress HEENT:  EOMI, sclera anicteric Neck:     No masses; no thyromegaly, trach Lungs:    Clear to auscultation bilaterally; normal respiratory effort CV:         Regular rate and rhythm; no murmurs Abd:      + bowel sounds; soft, non-tender; no palpable masses, no distension Ext:     Contracted extremities.  Skin:      Warm and dry; no rash Neuro: Awake, does not follow commands   Resolved Hospital Problem list   N/A  Assessment & Plan:  Acute on chronic respiratory failure in the setting of COVID-19 pneumonia, aspiration, inability to secretion Continue full vent support, pressure support trials as tolerated.  COVID-19 pneumonia, aspiration Finish course of dexamethasone, remdesivir. Circulatory shock with background chronic vasoplegia (s/p albumin 12/29-12/30/2020) Off pressors.  Continue midodrine Stop stress dose steroids  AKI, hypokalemia, hypomagnesemia Off bicarb drip Replete lites.  Follow urine output and creatinine  Suprapubic catheter with initial concern for posterior bladder wall perforation, Proteus colonization of suprapubic catheter-  subsequent CT abdomen pelvis did not show a leak, suprapubic catheter changed on    Severe multisite osteomyelitis related to pressure sores.  Bone biopsy not possible at this time due to Covid status and fraility. Continue cefepime, linezolid for now. Will check with ID about duration of abx needed  Severe protein calorie malnutrition present on admission-  Continue tube feeds  Elevated LFTs.  No evidence of cholecystitis on recent HIDA scan [place]  Quadriplegia/SAcral decub related to prior spinal cord injury with sacral decub Wound care, pain control  Goals of care No CPR.  Palliative care on board Called and discussed with mother 1/5 We agreed that Zaniah is not a candidate for aggressive measures such as dialysis if kidney function were to worsen. Continue supportive care for now and transition  to comfort if clinical situation worsens  Best practice:  Diet: TF Pain/Anxiety/Delirium protocol (if indicated): Hold given encephalopathy VAP protocol (if indicated): Not needed right now DVT prophylaxis: Hep SQ GI prophylaxis: protonix Glucose control: SSI Mobility: BR  LABS     PULMONARY Recent Labs  Lab 01/18/19 2155 01/20/19 1210 01/20/19 2043 01/23/19 0318 01/23/19 1541  PHART 7.236* 7.083* 7.219* 7.483* 7.444  PCO2ART 65.0* 52.4* 41.0 34.8 37.3  PO2ART 73.0* 67.0* 92.0 54.0* 57.0*  HCO3 27.6 15.6* 17.0* 26.2 25.6  TCO2 30 17* 18* 27 27  O2SAT 91.0 84.0 96.0 91.0 91.0    CBC Recent Labs  Lab 01/23/19 0454 01/23/19 1541 01/24/19 0338 01/25/19 0411  HGB 7.2* 9.2* 7.6* 8.0*  HCT 22.8* 27.0* 24.0* 25.6*  WBC 12.6*  --  7.9 7.8  PLT 250  --  248 231    COAGULATION Recent Labs  Lab 01/22/19 0626 01/22/19 0807 01/23/19 0454  INR QUESTIONABLE RESULTS, RECOMMEND RECOLLECT TO VERIFY 1.6* 1.5*    CARDIAC  No results for input(s): TROPONINI in the last 168 hours. No results for input(s): PROBNP in the last 168 hours.   CHEMISTRY Recent Labs  Lab 01/18/19 2155 01/19/19 0417 01/19/19 1821 01/20/19 0500 01/20/19 1210 01/21/19 1108 01/22/19 0807 01/23/19 0318 01/23/19 0454 01/23/19 1541 01/24/19 0338 01/25/19 0411  NA  --   --  138 140  --  139 142 143  --  142  --  139  K  --   --  4.2 5.0  --  2.8* 2.4* 3.0*  --  3.7  --  3.7  CL  --   --  115* 115*  --  109 109  --   --   --   --  103  CO2  --   --  16* 16*  --  21* 25  --   --   --   --  23  GLUCOSE  --   --  201* 120*  --  359* 354*  --   --   --   --  124*  BUN  --   --  87* 103*  --  98* 104*  --   --   --   --  126*  CREATININE   < >  --  0.94 1.04*   < > 1.19* 1.63*  --  1.33*  --  1.33* 1.46*  CALCIUM  --   --  9.2 7.9*  --  8.2* 7.7*  --   --   --   --  7.2*  MG  --  1.6* 2.2  --   --   --   --   --  1.6*  --  1.6* 2.1  PHOS  --  2.8 2.9  --   --   --   --   --  <1.0*  --  2.4* 4.1   < > = values in this interval not displayed.   Estimated Creatinine Clearance: 37.3 mL/min (A) (by C-G formula based on SCr of 1.46 mg/dL (H)).   LIVER Recent Labs  Lab 01/21/19 1108 01/22/19 0626 01/22/19 0807 01/23/19 0454 01/23/19 1150 01/24/19 0338 01/25/19 0411  AST 44*   --  77*  76*  --  115* 112* 83*  ALT 26  --  38  38  --  52* 59* 53*  ALKPHOS 1,527*  --  1,486*  1,304*  --  1,270* 1,342* 1,366*  BILITOT 1.7*  --  1.9*  2.2*  --  2.1* 1.9* 1.6*  PROT 5.3*  --  4.8*  4.8*  --  5.1* 5.3* 5.2*  ALBUMIN 1.8*  --  1.6*  1.7*  --  1.5* 1.5* 1.5*  INR  --  QUESTIONABLE RESULTS, RECOMMEND RECOLLECT TO VERIFY 1.6* 1.5*  --   --   --      INFECTIOUS Recent Labs  Lab 01/20/19 1256 01/21/19 0545 01/22/19 0807  LATICACIDVEN 0.8  --   --   PROCALCITON 0.42 0.30 0.60     ENDOCRINE CBG (last 3)  Recent Labs    01/24/19 2048 01/25/19 0045 01/25/19 0405  GLUCAP 111* 132* 109*     The patient is critically ill with multiple organ system failure and requires high complexity decision making for assessment and support, frequent evaluation and titration of therapies, advanced monitoring, review of radiographic studies and interpretation of complex data.   Critical Care Time devoted to patient care services, exclusive of separately billable procedures, described in this note is 35 minutes.   Marshell Garfinkel MD [facility] Please see [email or web address] for pager details.  01/25/2019, 8:10 AM

## 2019-01-26 DIAGNOSIS — Z7189 Other specified counseling: Secondary | ICD-10-CM

## 2019-01-26 LAB — COMPREHENSIVE METABOLIC PANEL
ALT: 52 U/L — ABNORMAL HIGH (ref 0–44)
AST: 83 U/L — ABNORMAL HIGH (ref 15–41)
Albumin: 1.4 g/dL — ABNORMAL LOW (ref 3.5–5.0)
Alkaline Phosphatase: 1358 U/L — ABNORMAL HIGH (ref 38–126)
Anion gap: 14 (ref 5–15)
BUN: 145 mg/dL — ABNORMAL HIGH (ref 6–20)
CO2: 21 mmol/L — ABNORMAL LOW (ref 22–32)
Calcium: 7 mg/dL — ABNORMAL LOW (ref 8.9–10.3)
Chloride: 104 mmol/L (ref 98–111)
Creatinine, Ser: 1.4 mg/dL — ABNORMAL HIGH (ref 0.44–1.00)
GFR calc Af Amer: 51 mL/min — ABNORMAL LOW (ref >60)
GFR calc non Af Amer: 44 mL/min — ABNORMAL LOW (ref >60)
Glucose, Bld: 61 mg/dL — ABNORMAL LOW (ref 70–99)
Potassium: 3.1 mmol/L — ABNORMAL LOW (ref 3.5–5.1)
Sodium: 139 mmol/L (ref 135–145)
Total Bilirubin: 1 mg/dL (ref 0.3–1.2)
Total Protein: 4.8 g/dL — ABNORMAL LOW (ref 6.5–8.1)

## 2019-01-26 LAB — GLUCOSE, CAPILLARY
Glucose-Capillary: 57 mg/dL — ABNORMAL LOW (ref 70–99)
Glucose-Capillary: 178 mg/dL — ABNORMAL HIGH (ref 70–99)
Glucose-Capillary: 50 mg/dL — ABNORMAL LOW (ref 70–99)
Glucose-Capillary: 51 mg/dL — ABNORMAL LOW (ref 70–99)
Glucose-Capillary: 137 mg/dL — ABNORMAL HIGH (ref 70–99)
Glucose-Capillary: 108 mg/dL — ABNORMAL HIGH (ref 70–99)
Glucose-Capillary: 119 mg/dL — ABNORMAL HIGH (ref 70–99)
Glucose-Capillary: 108 mg/dL — ABNORMAL HIGH (ref 70–99)
Glucose-Capillary: 104 mg/dL — ABNORMAL HIGH (ref 70–99)

## 2019-01-26 LAB — PHOSPHORUS: Phosphorus: 3.3 mg/dL (ref 2.5–4.6)

## 2019-01-26 LAB — CBC
HCT: 23.9 % — ABNORMAL LOW (ref 36.0–46.0)
Hemoglobin: 7.5 g/dL — ABNORMAL LOW (ref 12.0–15.0)
MCH: 26.4 pg (ref 26.0–34.0)
MCHC: 31.4 g/dL (ref 30.0–36.0)
MCV: 84.2 fL (ref 80.0–100.0)
Platelets: 209 10*3/uL (ref 150–400)
RBC: 2.84 MIL/uL — ABNORMAL LOW (ref 3.87–5.11)
RDW: 27.1 % — ABNORMAL HIGH (ref 11.5–15.5)
WBC: 11.8 10*3/uL — ABNORMAL HIGH (ref 4.0–10.5)
nRBC: 0.4 % — ABNORMAL HIGH (ref 0.0–0.2)

## 2019-01-26 LAB — MAGNESIUM: Magnesium: 2 mg/dL (ref 1.7–2.4)

## 2019-01-26 MED ORDER — POTASSIUM CHLORIDE 10 MEQ/50ML IV SOLN
10.0000 meq | INTRAVENOUS | Status: AC
  Administered 2019-01-26 (×5): 10 meq via INTRAVENOUS
  Filled 2019-01-26 (×6): qty 50

## 2019-01-26 MED ORDER — DEXTROSE 50 % IV SOLN
INTRAVENOUS | Status: AC
  Administered 2019-01-26: 04:00:00 50 mL
  Filled 2019-01-26: qty 50

## 2019-01-26 MED ORDER — DEXTROSE 50 % IV SOLN
INTRAVENOUS | Status: AC
  Administered 2019-01-26: 08:00:00 50 mL
  Filled 2019-01-26: qty 50

## 2019-01-26 MED ORDER — FUROSEMIDE 10 MG/ML IJ SOLN
60.0000 mg | Freq: Every day | INTRAMUSCULAR | Status: DC
  Administered 2019-01-26: 13:00:00 60 mg via INTRAVENOUS
  Filled 2019-01-26: qty 6

## 2019-01-26 MED ORDER — SODIUM CHLORIDE 0.9 % IV BOLUS
1000.0000 mL | Freq: Once | INTRAVENOUS | Status: AC
  Administered 2019-01-26: 02:00:00 1000 mL via INTRAVENOUS

## 2019-01-26 MED ORDER — INSULIN DETEMIR 100 UNIT/ML ~~LOC~~ SOLN
5.0000 [IU] | Freq: Two times a day (BID) | SUBCUTANEOUS | Status: DC
  Administered 2019-01-27 – 2019-02-02 (×12): 5 [IU] via SUBCUTANEOUS
  Filled 2019-01-26 (×16): qty 0.05

## 2019-01-26 NOTE — Progress Notes (Signed)
Hypoglycemic Event  CBG: 50  Treatment: D50 50 mL (25 gm)  Symptoms: None  Follow-up CBG: [id number] CBG Result:178  Possible Reasons for Event: Inadequate meal intake     Angela Page

## 2019-01-26 NOTE — Plan of Care (Signed)
  Problem: Clinical Measurements: Goal: Respiratory complications will improve Outcome: Progressing   Problem: Coping: Goal: Level of anxiety will decrease Outcome: Progressing   Problem: Elimination: Goal: Will not experience complications related to bowel motility Outcome: Progressing Goal: Will not experience complications related to urinary retention Outcome: Progressing   Problem: Safety: Goal: Ability to remain free from injury will improve Outcome: Progressing   

## 2019-01-26 NOTE — Plan of Care (Signed)
  Problem: Nutrition: Goal: Adequate nutrition will be maintained Outcome: Progressing   Problem: Elimination: Goal: Will not experience complications related to bowel motility Outcome: Progressing Goal: Will not experience complications related to urinary retention Outcome: Progressing   Problem: Skin Integrity: Goal: Risk for impaired skin integrity will decrease Outcome: Not Progressing  Pt has non healing decubitus ulcer. WOC following, dressing changes BID

## 2019-01-26 NOTE — Progress Notes (Signed)
NAME:  Angela Page, MRN:  [id number], DOB:  03/09/1970, LOS: 23 ADMISSION DATE:  12/27/2018, CONSULTATION DATE:  01/18/19 REFERRING MD:  Neysa Bonito, CHIEF COMPLAINT:  Dyspnea   Brief History    49 year old quadriplegic presenting with osteomyelitis related to multiple pressure injuries.  Incidentally found to be Covid positive.  She is chronically on 3 L of oxygen through her tracheostomy.  She is not vent dependent.  Has a bunch of issues: (1) Anemia, unexplained, AC stopped until yesterday, transfused 1 unit (2) Osteomyelitis of hips/sacrum- on empiric abx, ortho does not want to bone biopsy, duration and narrowing TBD (3) Worsening hypoxemia- related to covid and possibly aspiration (4) Malpositioned suprapubic catheter- replaced by urology, no evidence of leak on CT (5) UTI vs. colonization (6) Anxiety/insomnia requiring intermittent benzodiazepines (7) Elevated Alk phos related to bony involvement/osteo (8) acute on chronic hypotension  PCCM consulted as patient deteriorated further from respiratory standpoint requiring suctioning to bring back up saturations.  Past Medical History    has a past medical history of Acute on chronic respiratory failure with hypoxia ([place]), Anasarca (06/10/2016), Asthma, At high risk for severe sepsis, Cocaine use (10/08/2013), Decubitus ulcer of buttock, unstageable ([place]), Depression, Diabetes mellitus without complication ([place]), GERD (gastroesophageal reflux disease), HCAP (healthcare-associated pneumonia) (06/09/2016), Hepatitis, Kidney stone, Lobar pneumonia ([place]), Overdose of opiate or related narcotic ([place]) (09/02/2014), Pacemaker, Pleurisy, Polysubstance dependence including opioid type drug, episodic abuse ([place]) (10/27/2013), Protein calorie malnutrition ([place]) (10/31/2015), Quadriparesis ([place]), Quadriplegia and quadriparesis ([place]), Quadriplegia, C5-C7 incomplete ([place]) (10/08/2013), Stage IV pressure ulcer of sacral region ([place]) (10/31/2015), and  Tracheostomy status ([place]).   has a past surgical history that includes Appendectomy; Cardiac surgery; I & D extremity (10/28/2011); ir generic historical (11/23/2015); Back surgery (10/08/2013); Tracheostomy; Gastrostomy (11/23/2015); Insertion of suprapubic catheter (N/A, 07/28/2016); Cystoscopy w/ ureteral stent placement (Right, 03/09/2017); Cystoscopy/ureteroscopy/holmium laser/stent placement (Right, 07/16/2017); Cystoscopy w/ ureteral stent removal (Right, 07/16/2017); Lithotripsy (08/2017); IR PATIENT EVAL TECH 0-60 MINS (12/18/2017); and IR REPLACE G-TUBE SIMPLE WO FLUORO (12/19/2017).   Significant Hospital Events   12/23/2018 -admit  12/27 - palliative care consjult  12/28 - CTA - No PE   12/29 - ccm consul;t, limited code and icu transfer. . On iv heparin gtt for suspected PE  12/30 -  mag replaced. On IV heparin gtt. On o2 via trach at 4L [place]. On bair hugger. Bedsidde RN reports that 40 min aftrer transfer here patient has been fine and not needed vent since yesterday. Was on levophed few houirs yesterday but improved after midodrine yesterday. Currently sb p 92/map 65. WAs On scheduled albumin Q6h since yesterday -> I v heparin gtt stopped  12/.31 -  recallled by triad after MD noticed lethargy and tachypnea . BUN up at 103 and creat jumped. Has worsening metab and resp acidosis with ph 7.08 . dUplex LE negativ 01/19/2019- > back on vent. Renal consult: AKI due to CTA and vanc. Recommend med mgmt only. Poor CRRT/HD candidate per renal  1/1 -  on neo gtt and bic gtt d5water at 100cc/h and back on vent with 50% fio2. Has 4 cuffed [place]iley trach. Making urine but output hard to measure due to suprapubic cath. EDema + per RN. Does not communicate much. Creat worse today. On TF  1/2- off pressors. On vent.  1/3 Made DNR after palliative care discussion.   Consults:  ID, urology, wound care  Procedures:  12/24 [facility] replaced  Significant Diagnostic Tests:  CXR 12/29 IMPRESSION: Small left  pleural effusion  and basilar airspace disease have worsened since the most recent plain film. Small right pleural effusion and basilar airspace disease appear unchanged.  CT Chest 12/28 IMPRESSION: 1. No evidence of a pulmonary embolism. 2. Suspect pulmonary edema, likely on the basis of congestive heart failure, reflected by interstitial thickening, small bilateral effusions and patchy areas of ground-glass opacity. Infection should be considered if there are consistent clinical findings. 3. There is additional dependent opacity in the lower lobes consistent with atelectasis. 4. Two vessel coronary artery calcifications. Minor aortic atherosclerosis.  Micro Data:  12/23 - urine - proteus 12/23 - covid + 122/28 - MRSA PCR positive  Antimicrobials:  Vancomycin 12/23 >>  12/30 Remdesivir 12/23 - 12/27 Doxycycline 12/27 >> 12/29  Cefepime 12/23 (proteus) uti >> Linezolid 12/31 >>  Interim history/subjective:  No acute events overnight.   Objective   Blood pressure (!) 75/63, pulse 80, temperature (!) 94.1 F (34.5 C), temperature source Axillary, resp. rate (!) 30, height '5\' 2"'$  (1.575 m), weight 56 kg, last menstrual period 05/27/2016, SpO2 99 %. CVP:  [11 mmHg-17 mmHg] 11 mmHg  Vent Mode: PRVC FiO2 (%):  [40 %] 40 % Set Rate:  [16 bmp] 16 bmp Vt Set:  [400 mL] 400 mL PEEP:  [5 cmH20] 5 cmH20 Pressure Support:  [15 cmH20] 15 cmH20 Plateau Pressure:  [20 XFG18-29 cmH20] 21 cmH20   Intake/Output Summary (Last 24 hours) at 01/26/2019 9371 Last data filed at 01/26/2019 0700 Gross per 24 hour  Intake 2142.64 ml  Output 675 ml  Net 1467.64 ml   Filed Weights   01/22/19 0500 01/23/19 0500 01/24/19 0347  Weight: 55.2 kg 55.1 kg 56 kg   Gen:      Cachectic HEENT:  EOMI, sclera anicteric Neck:     No masses; no thyromegaly, trach Lungs:    Clear to auscultation bilaterally; increased accessory muscle use. Doesn't seem to change with ventilator mode. Vt does drop when PSV  <15. CV:         Regular rate and rhythm; no murmurs Abd:      + bowel sounds; soft, non-tender; no palpable masses, no distension Ext:    Contracted extremities.  Skin:      Warm and dry; no rash Neuro: Awake, does not follow commands   Resolved Hospital Problem list   N/A  Assessment & Plan:    Critically ill due to acute on chronic hypoxic hypercapnic respiratory failure requiring mechanical ventilation due to COVID-19 pneumonia, aspiration, inability to clear secretions COVID-19 and aspiration pneumonia Circulatory shock with background chronic vasoplegia Severe multisite osteomyelitis related to pressure sores due to quadriplegia (present on admission)  Sepsis has resolved.  We will continue empiric antibiotic coverage for osteomyelitis.  Bone biopsy has been deferred for now due to frailty.  Will need to establish duration of therapy. Currently not tolerating weaning.  Will likely need diuresis. Poor overall prognosis.  Now DNR.  Palliative care is following.   Daily Goals Checklist  Pain/Anxiety/Delirium protocol (if indicated): As needed oxycodone only VAP protocol (if indicated): Bundle in place Respiratory support goals: Daily SBT trial.  Weaning may improve with diuresis. Blood pressure target: Keep MAP greater than 65.  Continue midodrine.  Off IV vasopressors. DVT prophylaxis: Heparin 3 times daily Nutrition Status: Nutrition Problem: Increased nutrient needs Etiology: chronic illness, acute illness(acute on chronic hypoxia in the setting of COVID-19 infection with tracheostomy; osteomyelitis of left fibula;greater trochanter, stage IV decubitis ulcer; UTI) Signs/Symptoms: estimated needs Interventions: Tube feeding, Prostat, MVI,  Juven GI prophylaxis: Pantoprazole Fluid status goals: Markedly fluid positive.  Will initiate gentle diuresis. Urinary catheter: Chronic suprapubic catheter. Glucose control: Hypoglycemia overnight.  Will decrease detemir  dose. Mobility/therapy needs: Chronically bedbound due to quadriplegia Antibiotic de-escalation: Cefepime and linezolid. Home medication reconciliation: Home antidepressants started Daily labs: Daily BMP while on diuretic. Code Status: DNR, no escalation of care, palliative care involved. Family Communication: We will update family. Disposition: ICU.   LABS    PULMONARY Recent Labs  Lab 01/20/19 1210 01/20/19 2043 01/23/19 0318 01/23/19 1541  PHART 7.083* 7.219* 7.483* 7.444  PCO2ART 52.4* 41.0 34.8 37.3  PO2ART 67.0* 92.0 54.0* 57.0*  HCO3 15.6* 17.0* 26.2 25.6  TCO2 17* 18* 27 27  O2SAT 84.0 96.0 91.0 91.0    CBC Recent Labs  Lab 01/24/19 0338 01/25/19 0411 01/26/19 0347  HGB 7.6* 8.0* 7.5*  HCT 24.0* 25.6* 23.9*  WBC 7.9 7.8 11.8*  PLT 248 231 209    COAGULATION Recent Labs  Lab 01/22/19 0626 01/22/19 0807 01/23/19 0454  INR QUESTIONABLE RESULTS, RECOMMEND RECOLLECT TO VERIFY 1.6* 1.5*    CARDIAC  No results for input(s): TROPONINI in the last 168 hours. No results for input(s): PROBNP in the last 168 hours.   CHEMISTRY Recent Labs  Lab 01/19/19 1821 01/20/19 0500 01/20/19 1210 01/21/19 1108 01/22/19 0807 01/23/19 0318 01/23/19 0454 01/23/19 1541 01/24/19 0338 01/25/19 0411 01/26/19 0347  NA 138 140  --  139 142 143  --  142  --  139 139  K 4.2 5.0  --  2.8* 2.4* 3.0*  --  3.7  --  3.7 3.1*  CL 115* 115*  --  109 109  --   --   --   --  103 104  CO2 16* 16*  --  21* 25  --   --   --   --  23 21*  GLUCOSE 201* 120*  --  359* 354*  --   --   --   --  124* 61*  BUN 87* 103*  --  98* 104*  --   --   --   --  126* 145*  CREATININE 0.94 1.04*   < > 1.19* 1.63*  --  1.33*  --  1.33* 1.46* 1.40*  CALCIUM 9.2 7.9*  --  8.2* 7.7*  --   --   --   --  7.2* 7.0*  MG 2.2  --   --   --   --   --  1.6*  --  1.6* 2.1 2.0  PHOS 2.9  --   --   --   --   --  <1.0*  --  2.4* 4.1 3.3   < > = values in this interval not displayed.   Estimated Creatinine  Clearance: 38.9 mL/min (A) (by C-G formula based on SCr of 1.4 mg/dL (H)).   LIVER Recent Labs  Lab 01/22/19 0626 01/22/19 0807 01/23/19 0454 01/23/19 1150 01/24/19 0338 01/25/19 0411 01/26/19 0347  AST  --  77*  76*  --  115* 112* 83* 83*  ALT  --  38  38  --  52* 59* 53* 52*  ALKPHOS  --  1,486*  1,304*  --  1,270* 1,342* 1,366* 1,358*  BILITOT  --  1.9*  2.2*  --  2.1* 1.9* 1.6* 1.0  PROT  --  4.8*  4.8*  --  5.1* 5.3* 5.2* 4.8*  ALBUMIN  --  1.6*  1.7*  --  1.5* 1.5* 1.5* 1.4*  INR QUESTIONABLE RESULTS, RECOMMEND RECOLLECT TO VERIFY 1.6* 1.5*  --   --   --   --      INFECTIOUS Recent Labs  Lab 01/20/19 1256 01/21/19 0545 01/22/19 0807  LATICACIDVEN 0.8  --   --   PROCALCITON 0.42 0.30 0.60     ENDOCRINE CBG (last 3)  Recent Labs    01/26/19 0347 01/26/19 0416 01/26/19 0802  GLUCAP 50* 178* 51*   CRITICAL CARE Performed by: Kipp Brood   Total critical care time: 35 minutes  Critical care time was exclusive of separately billable procedures and treating other patients.  Critical care was necessary to treat or prevent imminent or life-threatening deterioration.  Critical care was time spent personally by me on the following activities: development of treatment plan with patient and/or surrogate as well as nursing, discussions with consultants, evaluation of patient's response to treatment, examination of patient, obtaining history from patient or surrogate, ordering and performing treatments and interventions, ordering and review of laboratory studies, ordering and review of radiographic studies, pulse oximetry, re-evaluation of patient's condition and participation in multidisciplinary rounds.  Kipp Brood, MD [facility] ICU Physician [place]  Pager: [phone] Mobile: [phone] After hours: [phone].     01/26/2019, 8:22 AM

## 2019-01-26 NOTE — Progress Notes (Signed)
Palliative:  HPI: 49 yo with past medical history of quadriplegia, tracheostomy, PEG, suprapubic catheter, stage IV sacral decubitus and osteomyelitis in several areas and was admitted on 12/23/2020with COVID infection and cellulitis on her back. Imaging showed her suprapubic catheter had punctured her posterior bladder wall leaving fluid in the pelvis and osteomyelitis had progressed into her fibula.She continues to decline in ICU and limited options for antibiotic treatment. Overall prognosis is extremely poor.   I met today at [place] bedside. She does look at me and I attempt to communicate with her via eye blink but this was unsuccessful. She appears uncomfortable. Discussed with RN who will give pain medication and also reports he has not been able to have any communication with [name] as well.   I called and spoke with mother, [name]. I explained to [name] that Manami's options are becoming very limited. I explained that we do believe we are in a place where we are not going to be able to make [place] better. I expressed fear that we are doing things to her and not for her. [name] acknowledges that she knows this but is trying to stay hopeful. I explained the concern that we are unable to decide an antibiotic regimen with potentially causing her more harm and side effects and putting her through bone biopsy which we feel like would be more harm than benefit to her at this point. She appears to be suffering to me. [name] understands and wants [place] to be comfortable. She expresses that her family is struggling especially Ginnifer's 62 yo daughter. She tells me family is struggling to discuss these difficult topics. I offer to meet with family to discuss and offer emotional support but [name] declines.   All questions/concerns addressed. Emotional support provided.   Exam: Alert, awake, trach to vent. Gr[place]ace and appears uncomfortable. Unable to blink to yes/no questions unfortunately.  Frail, thin.   Plan: - Will need ongoing conversations.  - Unfortunately family is struggling with decision making and not ready for comfort care. They are trying to remain hopeful that something will change and she will begin to improve.   [place], NP Palliative Medicine Team Pager [phone] (Please see [email or web address] for schedule) Team Phone [phone]    Greater than 50%  of this time was spent counseling and coordinating care related to the above assessment and plan

## 2019-01-26 NOTE — Progress Notes (Signed)
Hypoglycemic Event  CBG: 01/26/19 0802 CBG=51  Treatment: D50 50 mL (25 gm)  Symptoms: None  Follow-up CBG: [id number] CBG Result:137  Possible Reasons for Event: Unknown  Comments/MD notified:Dr. Marthann Schiller

## 2019-01-26 NOTE — Progress Notes (Signed)
[facility] Progress Note Patient Name: Angela Page DOB: September 23, 1970 MRN: [id number]   Date of Service  01/26/2019  HPI/Events of Note  Hypotension - BP = 71/52 with MAP = 59. HR = 80. Hgb = 8.0.   eICU Interventions  Will order: 1. Bolus with 0.9 NaCl 1 liter IV over 1 hour now.  2. Monitor CVP now and Q 4 hours.      Intervention Category Major Interventions: Hypotension - evaluation and management  Wilma Wuthrich Eugene 01/26/2019, 2:11 AM

## 2019-01-26 NOTE — Progress Notes (Signed)
Inpatient Diabetes Program Recommendations  AACE/ADA: New Consensus Statement on Inpatient Glycemic Control (2015)  Target Ranges:  Prepandial:   less than 140 mg/dL      Peak postprandial:   less than 180 mg/dL (1-2 hours)      Critically ill patients:  140 - 180 mg/dL   Lab Results  Component Value Date   GLUCAP 51 (L) 01/26/2019   HGBA1C 7.1 (H) 07/20/2018    Review of Glycemic Control Results for Angela Page, Angela Page (MRN [id number]) as of 01/26/2019 08:16  Ref. Range 01/25/2019 08:21 01/25/2019 11:20 01/25/2019 15:15 01/25/2019 19:38 01/25/2019 23:17 01/26/2019 03:28 01/26/2019 03:47 01/26/2019 04:16 01/26/2019 08:02  Glucose-Capillary Latest Ref Range: 70 - 99 mg/dL 87 99 98 112 (H) 103 (H) 57 (L) 50 (L) 178 (H) 51 (L)   Diabetes history: DM 2 Outpatient Diabetes medications: Basaglar 5 units, Trulicity 1.5  Q Friday, Novolog 0-8 units Q4 hours Current orders for Inpatient glycemic control:  Levemir 15 units Novolog 0-24 units Q4 hours Novolog 5 units Q4 hours Tube Feed Coverage  BUN/Creat: 145/1.40 Tube Feeds: vital AF 30 ml/hour  Inpatient Diabetes Program Recommendations:    Pt no longer recieving Solucrtef. Pt experiencing repeated hypoglycemia.  - Reduce Levemir to 8 units - Reduce Novolog Correction scale to 0-15 units Q4  Thanks,  Tama Headings RN, MSN, BC-ADM Inpatient Diabetes Coordinator Team Pager [phone] (8a-5p)

## 2019-01-26 NOTE — Progress Notes (Signed)
[place] Progress Note Patient Name: Makenzee Mathe DOB: Apr 26, 1970 MRN: [id number]   Date of Service  01/26/2019  HPI/Events of Note  Hypokalemia - K+ = 3.1 and Creatinine = 1.45.   eICU Interventions  Will replace K+.      Intervention Category Major Interventions: Electrolyte abnormality - evaluation and management  Talayeh Bruinsma Eugene 01/26/2019, 5:41 AM

## 2019-01-27 LAB — CBC
HCT: 22.4 % — ABNORMAL LOW (ref 36.0–46.0)
Hemoglobin: 7 g/dL — ABNORMAL LOW (ref 12.0–15.0)
MCH: 26.2 pg (ref 26.0–34.0)
MCHC: 31.3 g/dL (ref 30.0–36.0)
MCV: 83.9 fL (ref 80.0–100.0)
Platelets: 187 10*3/uL (ref 150–400)
RBC: 2.67 MIL/uL — ABNORMAL LOW (ref 3.87–5.11)
RDW: 27.2 % — ABNORMAL HIGH (ref 11.5–15.5)
WBC: 13.2 10*3/uL — ABNORMAL HIGH (ref 4.0–10.5)
nRBC: 0.3 % — ABNORMAL HIGH (ref 0.0–0.2)

## 2019-01-27 LAB — GLUCOSE, CAPILLARY
Glucose-Capillary: 90 mg/dL (ref 70–99)
Glucose-Capillary: 115 mg/dL — ABNORMAL HIGH (ref 70–99)
Glucose-Capillary: 199 mg/dL — ABNORMAL HIGH (ref 70–99)
Glucose-Capillary: 160 mg/dL — ABNORMAL HIGH (ref 70–99)
Glucose-Capillary: 130 mg/dL — ABNORMAL HIGH (ref 70–99)
Glucose-Capillary: 77 mg/dL (ref 70–99)

## 2019-01-27 LAB — BASIC METABOLIC PANEL
Anion gap: 16 — ABNORMAL HIGH (ref 5–15)
BUN: 155 mg/dL — ABNORMAL HIGH (ref 6–20)
CO2: 21 mmol/L — ABNORMAL LOW (ref 22–32)
Calcium: 7.2 mg/dL — ABNORMAL LOW (ref 8.9–10.3)
Chloride: 105 mmol/L (ref 98–111)
Creatinine, Ser: 1.42 mg/dL — ABNORMAL HIGH (ref 0.44–1.00)
GFR calc Af Amer: 50 mL/min — ABNORMAL LOW (ref >60)
GFR calc non Af Amer: 44 mL/min — ABNORMAL LOW (ref >60)
Glucose, Bld: 121 mg/dL — ABNORMAL HIGH (ref 70–99)
Potassium: 3.6 mmol/L (ref 3.5–5.1)
Sodium: 142 mmol/L (ref 135–145)

## 2019-01-27 LAB — MAGNESIUM: Magnesium: 1.9 mg/dL (ref 1.7–2.4)

## 2019-01-27 LAB — PHOSPHORUS: Phosphorus: 2.9 mg/dL (ref 2.5–4.6)

## 2019-01-27 MED ORDER — FUROSEMIDE 10 MG/ML IJ SOLN
60.0000 mg | Freq: Two times a day (BID) | INTRAMUSCULAR | Status: DC
  Administered 2019-01-27 (×2): 60 mg via INTRAVENOUS
  Filled 2019-01-27 (×3): qty 6

## 2019-01-27 NOTE — Progress Notes (Signed)
[place] Progress Note Patient Name: Angela Page DOB: [birth year] MRN: [id number]   Date of Service  01/27/2019  HPI/Events of Note  Patient oozing from multiple puncture sites. Currently on Heparin [place]. Nursing placed ordered SCD's.  eICU Interventions  Will order: 1. Hold Heparin  until patient re-evaluated by rounding team in AM.      Intervention Category Major Interventions: Other:  Lysle Dingwall 01/27/2019, 3:49 AM

## 2019-01-27 NOTE — Progress Notes (Signed)
Palliative:  HPI: HPI: 49 yo with past medical history of quadriplegia, tracheostomy, PEG, suprapubic catheter, stage IV sacral decubitus and osteomyelitis in several areas and was admitted on 12/23/2020with COVID infection and cellulitis on her back. Imaging showed her suprapubic catheter had punctured her posterior bladder wall leaving fluid in the pelvis and osteomyelitis had progressed into her fibula.She continues to decline in ICU and limited options for antibiotic treatment. Overall prognosis is extremely poor.    I discussed with Dr. Lynetta Mare poor prognosis and I also discussed status with bedside RN. Halynn's status has not changed. She has not improved and appears to be suffering in my opinion.   I called and discussed with mother, Angela Page, again today. I again reiterate my concern that we are out of options for Angela Page to improve. I also told her that I believe Angela Page is suffering and that we are prolonging her suffering without expectations of this situation improving. I worry because Angela Page cannot communicate with Angela Page and cannot tell Angela Page if she is hurting or what she is thinking. Angela Page tells me she knows but then speaks of adding Promad, silver in dressing changes, and gabapentin for pain. She tells me that Keema also likes watching [place] on television. I noted that she spoke to [place] daughter, Overton Mam, during our conversation and I question if this is why she is not discussing Angela Page's poor prognosis or offering her thoughts on recommendation for comfort care.   I also asked her if she was interested in meeting and encouraged her to consider tele visit with [name]. Angela Page says that she is hesitant to do video chat as she cannot be there to speak with her but then asks for me to check with her tomorrow and she will consider this. I encouraged family to continue to talk about what is best for Angela Page and I will check in tomorrow.   Plan: - Consider adding back low dose  gabapentin.  - Mother asking about protein supplement (Promad) and silver in dressing changes and I assure her that dietician and [place] have been helping make recommendations for Angela Page.  - [place] conversations ongoing.   25 min   Angela Sill, NP Palliative Medicine Team Pager [phone] (Please see [email or web address] for schedule) Team Phone [phone]    Greater than 50%  of this time was spent counseling and coordinating care related to the above assessment and plan   The above conversation was completed via telephone due to the visitor restrictions during the COVID-19 pandemic. Thorough chart review and discussion with necessary members of the care team was completed as part of assessment. All issues were discussed and addressed but no physical exam was performed.

## 2019-01-27 NOTE — Progress Notes (Signed)
NAME:  Angela Page, MRN:  [id number], DOB:  17-Aug-1970, LOS: 29 ADMISSION DATE:  01/11/2019, CONSULTATION DATE:  01/18/19 REFERRING MD:  Neysa Bonito, CHIEF COMPLAINT:  Dyspnea   Brief History    49 year old quadriplegic presenting with osteomyelitis related to multiple pressure injuries.  Incidentally found to be Covid positive.  She is chronically on 3 L of oxygen through her tracheostomy.  She is not vent dependent.  Has a bunch of issues: (1) Anemia, unexplained, AC stopped until yesterday, transfused 1 unit (2) Osteomyelitis of hips/sacrum- on empiric abx, ortho does not want to bone biopsy, duration and narrowing TBD (3) Worsening hypoxemia- related to covid and possibly aspiration (4) Malpositioned suprapubic catheter- replaced by urology, no evidence of leak on CT (5) UTI vs. colonization (6) Anxiety/insomnia requiring intermittent benzodiazepines (7) Elevated Alk phos related to bony involvement/osteo (8) acute on chronic hypotension  PCCM consulted as patient deteriorated further from respiratory standpoint requiring suctioning to bring back up saturations.  Past Medical History    has a past medical history of Acute on chronic respiratory failure with hypoxia ([place]), Anasarca (06/10/2016), Asthma, At high risk for severe sepsis, Cocaine use (10/08/2013), Decubitus ulcer of buttock, unstageable ([place]), Depression, Diabetes mellitus without complication ([place]), GERD (gastroesophageal reflux disease), HCAP (healthcare-associated pneumonia) (06/09/2016), Hepatitis, Kidney stone, Lobar pneumonia ([place]), Overdose of opiate or related narcotic ([place]) (09/02/2014), Pacemaker, Pleurisy, Polysubstance dependence including opioid type drug, episodic abuse ([place]) (10/27/2013), Protein calorie malnutrition ([place]) (10/31/2015), Quadriparesis ([place]), Quadriplegia and quadriparesis ([place]), Quadriplegia, C5-C7 incomplete ([place]) (10/08/2013), Stage IV pressure ulcer of sacral region ([place]) (10/31/2015), and  Tracheostomy status ([place]).   has a past surgical history that includes Appendectomy; Cardiac surgery; I & D extremity (10/28/2011); ir generic historical (11/23/2015); Back surgery (10/08/2013); Tracheostomy; Gastrostomy (11/23/2015); Insertion of suprapubic catheter (N/A, 07/28/2016); Cystoscopy w/ ureteral stent placement (Right, 03/09/2017); Cystoscopy/ureteroscopy/holmium laser/stent placement (Right, 07/16/2017); Cystoscopy w/ ureteral stent removal (Right, 07/16/2017); Lithotripsy (08/2017); IR PATIENT EVAL TECH 0-60 MINS (12/18/2017); and IR REPLACE G-TUBE SIMPLE WO FLUORO (12/19/2017).   Significant Hospital Events   01/01/2019 -admit  12/27 - palliative care consjult  12/28 - CTA - No PE   12/29 - ccm consul;t, limited code and icu transfer. . On iv heparin gtt for suspected PE  12/30 -  mag replaced. On IV heparin gtt. On o2 via trach at 4L [place]. On bair hugger. Bedsidde RN reports that 40 min aftrer transfer here patient has been fine and not needed vent since yesterday. Was on levophed few houirs yesterday but improved after midodrine yesterday. Currently sb p 92/map 65. WAs On scheduled albumin Q6h since yesterday -> I v heparin gtt stopped  12/.31 -  recallled by triad after MD noticed lethargy and tachypnea . BUN up at 103 and creat jumped. Has worsening metab and resp acidosis with ph 7.08 . dUplex LE negativ 01/19/2019- > back on vent. Renal consult: AKI due to CTA and vanc. Recommend med mgmt only. Poor CRRT/HD candidate per renal  1/1 -  on neo gtt and bic gtt d5water at 100cc/h and back on vent with 50% fio2. Has 4 cuffed Shiley trach. Making urine but output hard to measure due to suprapubic cath. EDema + per RN. Does not communicate much. Creat worse today. On TF  1/2- off pressors. On vent.  1/3 Made DNR after palliative care discussion.   Consults:  ID, urology, wound care  Procedures:  12/24 [facility] replaced  Significant Diagnostic Tests:  CXR 12/29 IMPRESSION: Small left  pleural effusion  and basilar airspace disease have worsened since the most recent plain film. Small right pleural effusion and basilar airspace disease appear unchanged.  CT Chest 12/28 IMPRESSION: 1. No evidence of a pulmonary embolism. 2. Suspect pulmonary edema, likely on the basis of congestive heart failure, reflected by interstitial thickening, small bilateral effusions and patchy areas of ground-glass opacity. Infection should be considered if there are consistent clinical findings. 3. There is additional dependent opacity in the lower lobes consistent with atelectasis. 4. Two vessel coronary artery calcifications. Minor aortic atherosclerosis.  Micro Data:  12/23 - urine - proteus 12/23 - covid + 122/28 - MRSA PCR positive  Antimicrobials:  Vancomycin 12/23 >>  12/30 Remdesivir 12/23 - 12/27 Doxycycline 12/27 >> 12/29  Cefepime 12/23 (proteus) uti >> Linezolid 12/31 >>  Interim history/subjective:  No acute events overnight.   Objective   Blood pressure (!) 79/63, pulse 88, temperature 98 F (36.7 C), temperature source Axillary, resp. rate (!) 28, height 5' 2" (1.575 m), weight 56 kg, last menstrual period 05/27/2016, SpO2 99 %.    Vent Mode: PRVC FiO2 (%):  [40 %] 40 % Set Rate:  [16 bmp] 16 bmp Vt Set:  [400 mL] 400 mL PEEP:  [5 cmH20] 5 cmH20 Plateau Pressure:  [27 cmH20-28 cmH20] 28 cmH20   Intake/Output Summary (Last 24 hours) at 01/27/2019 0954 Last data filed at 01/27/2019 0600 Gross per 24 hour  Intake 688.32 ml  Output 740 ml  Net -51.68 ml   Filed Weights   01/22/19 0500 01/23/19 0500 01/24/19 0347  Weight: 55.2 kg 55.1 kg 56 kg   Gen:      Cachectic HEENT:  EOMI, sclera anicteric Neck:     No masses; no thyromegaly, trach Lungs:    Clear to auscultation bilaterally; Appears more settled with less accessory muscle use this morning on PRVC, but immediate increase in WOB when set to wean. CV:         Regular rate and rhythm; no murmurs Abd:       + bowel sounds; soft, non-tender; no palpable masses, no distension Ext:    Contracted extremities.  Skin:      Warm and dry; no rash Neuro: Awake, does not follow commands   Resolved Hospital Problem list   N/A  Assessment & Plan:    Critically ill due to acute on chronic hypoxic hypercapnic respiratory failure requiring mechanical ventilation due to COVID-19 pneumonia, aspiration, inability to clear secretions COVID-19 and aspiration pneumonia Circulatory shock with background chronic vasoplegia Severe multisite osteomyelitis related to pressure sores due to quadriplegia (present on admission)  Sepsis has resolved.  We will continue empiric antibiotic coverage for osteomyelitis.  Bone biopsy has been deferred for now due to frailty.  Will need to establish duration of therapy. Currently not tolerating weaning, I will continue diuresing her but I fear that she is now so frail that she will not be able to wean.  Indeed, she has profound muscular weakness that is being compounded by the cachexia induced by the chronic osteomyelitis Overall prognosis is very poor and a return home is very unlikely.  Will discuss with palliative care was following as a gradual transition to comfort care may be appropriate if she does not make progress by the beginning of next week.   Daily Goals Checklist  Pain/Anxiety/Delirium protocol (if indicated): As needed oxycodone only VAP protocol (if indicated): Bundle in place Respiratory support goals: Daily SBT trial.  Weaning may improve with diuresis. Blood pressure target: Keep  MAP greater than 65.  Continue midodrine.  Off IV vasopressors. DVT prophylaxis: Heparin 3 times daily Nutrition Status: Nutrition Problem: Increased nutrient needs Etiology: chronic illness, acute illness(acute on chronic hypoxia in the setting of COVID-19 infection with tracheostomy; osteomyelitis of left fibula;greater trochanter, stage IV decubitis ulcer; UTI) Signs/Symptoms:  estimated needs Interventions: Tube feeding, Prostat, MVI, Juven,  GI prophylaxis: Pantoprazole Fluid status goals: Markedly fluid positive.  We will continue gentle diuresis Urinary catheter: Chronic suprapubic catheter. Glucose control: Hypoglycemia has improved with lower dose of insulin detemir Mobility/therapy needs: Chronically bedbound due to quadriplegia Antibiotic de-escalation: Cefepime and linezolid. Home medication reconciliation: Home antidepressants started Daily labs: Daily BMP while on diuretic. Code Status: DNR, no escalation of care, palliative care involved. Family Communication: We will update family. Disposition: ICU.   LABS    PULMONARY Recent Labs  Lab 01/20/19 1210 01/20/19 2043 01/23/19 0318 01/23/19 1541  PHART 7.083* 7.219* 7.483* 7.444  PCO2ART 52.4* 41.0 34.8 37.3  PO2ART 67.0* 92.0 54.0* 57.0*  HCO3 15.6* 17.0* 26.2 25.6  TCO2 17* 18* 27 27  O2SAT 84.0 96.0 91.0 91.0    CBC Recent Labs  Lab 01/25/19 0411 01/26/19 0347 01/27/19 0511  HGB 8.0* 7.5* 7.0*  HCT 25.6* 23.9* 22.4*  WBC 7.8 11.8* 13.2*  PLT 231 209 187    COAGULATION Recent Labs  Lab 01/22/19 0626 01/22/19 0807 01/23/19 0454  INR QUESTIONABLE RESULTS, RECOMMEND RECOLLECT TO VERIFY 1.6* 1.5*    CARDIAC  No results for input(s): TROPONINI in the last 168 hours. No results for input(s): PROBNP in the last 168 hours.   CHEMISTRY Recent Labs  Lab 01/21/19 1108 01/22/19 0807 01/22/19 0807 01/23/19 0318 01/23/19 0454 01/23/19 1541 01/24/19 0338 01/25/19 0411 01/26/19 0347 01/27/19 0511  NA 139 142  --  143  --  142  --  139 139 142  K 2.8* 2.4*   < > 3.0*  --  3.7  --  3.7 3.1* 3.6  CL 109 109  --   --   --   --   --  103 104 105  CO2 21* 25  --   --   --   --   --  23 21* 21*  GLUCOSE 359* 354*  --   --   --   --   --  124* 61* 121*  BUN 98* 104*  --   --   --   --   --  126* 145* 155*  CREATININE 1.19* 1.63*  --   --  1.33*  --  1.33* 1.46* 1.40* 1.42*    CALCIUM 8.2* 7.7*  --   --   --   --   --  7.2* 7.0* 7.2*  MG  --   --   --   --  1.6*  --  1.6* 2.1 2.0 1.9  PHOS  --   --   --   --  <1.0*  --  2.4* 4.1 3.3 2.9   < > = values in this interval not displayed.   Estimated Creatinine Clearance: 38.3 mL/min (A) (by C-G formula based on SCr of 1.42 mg/dL (H)).   LIVER Recent Labs  Lab 01/22/19 0626 01/22/19 0807 01/23/19 0454 01/23/19 1150 01/24/19 0338 01/25/19 0411 01/26/19 0347  AST  --  77*  76*  --  115* 112* 83* 83*  ALT  --  38  38  --  52* 59* 53* 52*  ALKPHOS  --  1,486*  1,304*  --  1,270* 1,342* 1,366* 1,358*  BILITOT  --  1.9*  2.2*  --  2.1* 1.9* 1.6* 1.0  PROT  --  4.8*  4.8*  --  5.1* 5.3* 5.2* 4.8*  ALBUMIN  --  1.6*  1.7*  --  1.5* 1.5* 1.5* 1.4*  INR QUESTIONABLE RESULTS, RECOMMEND RECOLLECT TO VERIFY 1.6* 1.5*  --   --   --   --      INFECTIOUS Recent Labs  Lab 01/20/19 1256 01/21/19 0545 01/22/19 0807  LATICACIDVEN 0.8  --   --   PROCALCITON 0.42 0.30 0.60     ENDOCRINE CBG (last 3)  Recent Labs    01/26/19 2304 01/27/19 0332 01/27/19 0817  GLUCAP 104* 90 115*   CRITICAL CARE Performed by: Kipp Brood   Total critical care time: 35 minutes  Critical care time was exclusive of separately billable procedures and treating other patients.  Critical care was necessary to treat or prevent imminent or life-threatening deterioration.  Critical care was time spent personally by me on the following activities: development of treatment plan with patient and/or surrogate as well as nursing, discussions with consultants, evaluation of patient's response to treatment, examination of patient, obtaining history from patient or surrogate, ordering and performing treatments and interventions, ordering and review of laboratory studies, ordering and review of radiographic studies, pulse oximetry, re-evaluation of patient's condition and participation in multidisciplinary rounds.  Kipp Brood, MD  [facility] ICU Physician [place]  Pager: [phone] Mobile: [phone] After hours: [phone].     01/27/2019, 9:54 AM

## 2019-01-28 DIAGNOSIS — M86659 Other chronic osteomyelitis, unspecified thigh: Secondary | ICD-10-CM

## 2019-01-28 DIAGNOSIS — J9621 Acute and chronic respiratory failure with hypoxia: Secondary | ICD-10-CM

## 2019-01-28 LAB — BASIC METABOLIC PANEL
Anion gap: 15 (ref 5–15)
BUN: 153 mg/dL — ABNORMAL HIGH (ref 6–20)
CO2: 20 mmol/L — ABNORMAL LOW (ref 22–32)
Calcium: 6.7 mg/dL — ABNORMAL LOW (ref 8.9–10.3)
Chloride: 107 mmol/L (ref 98–111)
Creatinine, Ser: 1.4 mg/dL — ABNORMAL HIGH (ref 0.44–1.00)
GFR calc Af Amer: 51 mL/min — ABNORMAL LOW (ref >60)
GFR calc non Af Amer: 44 mL/min — ABNORMAL LOW (ref >60)
Glucose, Bld: 133 mg/dL — ABNORMAL HIGH (ref 70–99)
Potassium: 2.3 mmol/L — CL (ref 3.5–5.1)
Sodium: 142 mmol/L (ref 135–145)

## 2019-01-28 LAB — CBC
HCT: 18.3 % — ABNORMAL LOW (ref 36.0–46.0)
Hemoglobin: 5.7 g/dL — CL (ref 12.0–15.0)
MCH: 26.8 pg (ref 26.0–34.0)
MCHC: 31.1 g/dL (ref 30.0–36.0)
MCV: 85.9 fL (ref 80.0–100.0)
Platelets: 136 10*3/uL — ABNORMAL LOW (ref 150–400)
RBC: 2.13 MIL/uL — ABNORMAL LOW (ref 3.87–5.11)
RDW: 26.8 % — ABNORMAL HIGH (ref 11.5–15.5)
WBC: 12.8 10*3/uL — ABNORMAL HIGH (ref 4.0–10.5)
nRBC: 0.2 % (ref 0.0–0.2)

## 2019-01-28 LAB — GLUCOSE, CAPILLARY
Glucose-Capillary: 118 mg/dL — ABNORMAL HIGH (ref 70–99)
Glucose-Capillary: 127 mg/dL — ABNORMAL HIGH (ref 70–99)
Glucose-Capillary: 133 mg/dL — ABNORMAL HIGH (ref 70–99)
Glucose-Capillary: 155 mg/dL — ABNORMAL HIGH (ref 70–99)
Glucose-Capillary: 180 mg/dL — ABNORMAL HIGH (ref 70–99)
Glucose-Capillary: 61 mg/dL — ABNORMAL LOW (ref 70–99)
Glucose-Capillary: 64 mg/dL — ABNORMAL LOW (ref 70–99)
Glucose-Capillary: 82 mg/dL (ref 70–99)

## 2019-01-28 LAB — PREPARE RBC (CROSSMATCH)

## 2019-01-28 LAB — HEMOGLOBIN AND HEMATOCRIT, BLOOD
HCT: 34.4 % — ABNORMAL LOW (ref 36.0–46.0)
Hemoglobin: 10.8 g/dL — ABNORMAL LOW (ref 12.0–15.0)

## 2019-01-28 MED ORDER — VITAL AF 1.2 CAL PO LIQD
1000.0000 mL | ORAL | Status: DC
  Administered 2019-01-28 – 2019-02-05 (×7): 1000 mL
  Filled 2019-01-28 (×5): qty 1000

## 2019-01-28 MED ORDER — WHITE PETROLATUM EX OINT
TOPICAL_OINTMENT | CUTANEOUS | Status: DC | PRN
  Filled 2019-01-28: qty 28.35

## 2019-01-28 MED ORDER — GABAPENTIN 300 MG PO CAPS: 300.0000 mg | ORAL_CAPSULE | Freq: Three times a day (TID) | ORAL | Status: DC

## 2019-01-28 MED ORDER — BACLOFEN 5 MG HALF TABLET
5.0000 mg | ORAL_TABLET | Freq: Three times a day (TID) | ORAL | Status: DC
  Administered 2019-01-28 – 2019-02-05 (×25): 5 mg
  Filled 2019-01-28 (×26): qty 1

## 2019-01-28 MED ORDER — SODIUM CHLORIDE 0.9% IV SOLUTION: Freq: Once | INTRAVENOUS | Status: DC

## 2019-01-28 MED ORDER — MORPHINE SULFATE (PF) 2 MG/ML IV SOLN
2.0000 mg | INTRAVENOUS | Status: DC | PRN
  Administered 2019-01-28 – 2019-02-01 (×2): 2 mg via INTRAVENOUS
  Filled 2019-01-28 (×2): qty 1

## 2019-01-28 MED ORDER — BACLOFEN 10 MG PO TABS: 5.0000 mg | ORAL_TABLET | Freq: Three times a day (TID) | ORAL | Status: DC

## 2019-01-28 MED ORDER — GABAPENTIN 300 MG PO CAPS
300.0000 mg | ORAL_CAPSULE | Freq: Three times a day (TID) | ORAL | Status: DC
  Administered 2019-01-28 – 2019-01-29 (×4): 300 mg
  Filled 2019-01-28 (×4): qty 1

## 2019-01-28 MED ORDER — POTASSIUM CHLORIDE 10 MEQ/50ML IV SOLN
10.0000 meq | INTRAVENOUS | Status: AC
  Administered 2019-01-28 (×6): 10 meq via INTRAVENOUS
  Filled 2019-01-28 (×6): qty 50

## 2019-01-28 MED ORDER — DEXTROSE 50 % IV SOLN
12.5000 g | Freq: Once | INTRAVENOUS | Status: AC
  Administered 2019-01-28: 04:00:00 12.5 g via INTRAVENOUS
  Filled 2019-01-28: qty 50

## 2019-01-28 MED ORDER — OXYCODONE-ACETAMINOPHEN 5-325 MG PO TABS
1.0000 | ORAL_TABLET | Freq: Four times a day (QID) | ORAL | Status: DC | PRN
  Administered 2019-01-28 – 2019-02-02 (×5): 1
  Filled 2019-01-28 (×6): qty 1

## 2019-01-28 NOTE — Progress Notes (Signed)
Palliative:  HPI: 21 yowith past medical history of quadriplegia, tracheostomy, PEG, suprapubic catheter, stage IV sacral decubitus and osteomyelitis in severalareas and wasadmitted on 12/23/2020with COVID infection and cellulitis on her back. Imaging showed her suprapubic catheter had punctured her posterior bladder wall leaving fluid in the pelvis and osteomyelitis had progressed into her fibula.She continues to decline in ICU and limited options for antibiotic treatment. Overall prognosis is extremely poor.   I met today again with [name]. She was sleeping but did not arouse to voice but I touched her arm and she was startled awake. Significantly more alert and able to interact and answer my questions. Unable to verbalize but I am able to easily read her lips and she is alert and appropriate. She has also progressed to trach collar. I spoke with Ricardo about how ill she has become and that these problems are not going to go away. I know that she has had pain and suffering. At this time she would want to go back on ventilator to prolong her life. I did explain that we know there comes a point where this may not make sense for her. I also gave her permission to make this choice as we know that she is suffering. Maryhelen agrees that I may call and keep her mother updated.   I called and spoke with mother, [name]. I updated her on Janice's change in status today. She is very happy that she has had some improvement. [name] also acknowledges that she knows this is not permanent but is hoping for improvement as long as they can get it. She thinks aloud of how different life could have been if [place] had not had her accident. She talks of how vibrant Michaline was prior and she has always been very sweet and kind. She hates seeing Veda go through life like this.   All questions/concerns addressed.   Exam: Sleepy but easily arouses. Appears oriented. No distress. Breathing regular, unlabored on  trach collar. Abd flat. Thin, frail. Poor dentition.   Plan: - Spoke with RN about helping to locate eye glasses and turning tv to [place] which Azhane enjoys. Also to obtain soft touch call bell now that Na is more alert.  - Continue on with aggressive care as desired by patient.   [place], NP Palliative Medicine Team Pager [phone] (Please see [email or web address] for schedule) Team Phone [phone]    Greater than 50%  of this time was spent counseling and coordinating care related to the above assessment and plan

## 2019-01-28 NOTE — Progress Notes (Signed)
CRITICAL VALUE ALERT  Critical Value:  Potassium 2.3  Date & Time Notied:  01/28/2019 @ 0529  Provider Notified: Warren Lacy RN [facility]  Orders Received/Actions taken: MD to be notified.

## 2019-01-28 NOTE — Progress Notes (Signed)
CRITICAL VALUE ALERT  Critical Value:  Hemoglobin 5.7  Date & Time Notied:  01/28/2019 @ 0522  Provider Notified: [place]  Orders Received/Actions taken: Will notify MD.

## 2019-01-28 NOTE — Progress Notes (Signed)
NAME:  Angela Page, MRN:  [id number], DOB:  04-29-1970, LOS: 89 ADMISSION DATE:  12/31/2018, CONSULTATION DATE:  01/18/19 REFERRING MD:  Neysa Bonito, CHIEF COMPLAINT:  Dyspnea   Brief History   49 yo female with hx of quadriplegia from incomplete C5-C7 fracture presented with osteomyelitis at multiple locations in setting of pressure ulcers.  Found to be COVID 19 positive.  Past Medical History  Asthma, Cocaine abuse, Depression, DM, GERD, Nephrolithiasis, Chronic pain,   Significant Hospital Events   12/23 admit 12/27 palliative care consjult 12/29 limited code; transfer to ICU 12/30 requiring pressors 12/31 altered mental status 01/02 off pressors. On vent. 01/03 Made DNR after palliative care discussion.   Consults:  ID, urology, wound care  Procedures:  Rt PICC 12/24 >>   Significant Diagnostic Tests:  CT abd/pelvis 12/23 >> hyper-enhancement of GB, gastric wall thickening, suprapubic catheter through posterior bladder wall, large area of ulceration over Lt greater trochanter with progression of osteomyelitis, progression of ulceration over Rt ischial tuberosity with osteomyelitis, focal sacral decubitus ulcer with osteomyelitis Abd u/s 12/26 >> mild GB thickening CT Chest 12/28 >> pulmonary edema, patchy GGO, atherosclerosis Doppler arms and legs b/l 12/30 >> no DVT NM hepatobiliary scan 12/31 >> no evidence of cystic duct obstruction or acute cholecystitis  Micro Data:  SARS CoV2 PCR 12/23 >> positive Urine 12/23 >> Proteus miribalis Blood 12/23 >> negative  COVID Therapy:  Remdesivir 12/23 - 12/27  Antimicrobials:  Vancomycin 12/23 >> 12/29 Cefepime 12/23 >> 1/04 Doxycycline 12/27 >> 12/29 Linezolid 12/31 >> 1/05  Interim history/subjective:  C/o back pain.  Getting PRBC transfusion.  Objective   Blood pressure (!) 79/62, pulse 80, temperature (!) 97 F (36.1 C), temperature source Oral, resp. rate 13, height 5\' 2"  (1.575 m), weight 56.9 kg, last  menstrual period 05/27/2016, SpO2 100 %.    Vent Mode: CPAP;PSV FiO2 (%):  [40 %-45 %] 45 % Set Rate:  [16 bmp] 16 bmp Vt Set:  [400 mL] 400 mL PEEP:  [5 cmH20] 5 cmH20 Pressure Support:  [5 cmH20] 5 cmH20 Plateau Pressure:  [22 cmH20-23 cmH20] 23 cmH20   Intake/Output Summary (Last 24 hours) at 01/28/2019 1403 Last data filed at 01/28/2019 1400 Gross per 24 hour  Intake 1850.37 ml  Output 1970 ml  Net -119.63 ml   Filed Weights   01/23/19 0500 01/24/19 0347 01/28/19 0416  Weight: 55.1 kg 56 kg 56.9 kg    General - alert Eyes - pupils reactive ENT - poor dentition, trach site clean Cardiac - regular rate/rhythm, no murmur Chest - equal breath sounds b/l, no wheezing or rales Abdomen - soft, G tube in place, suprapubic cath in place Extremities - contracted, decreased muscle bulk Skin - no rashes Neuro - moves shoulders, follows commands   Resolved Hospital Problem list   Sepsis  Assessment & Plan:   Acute on chronic hypoxic/hypercapnic respiratory failure from COVID 19 pneumonia and aspiration pneumonia. Tracheostomy status. - pressure support to TC as able - f/u CXR  Anemia of critical illness and chronic disease. - no obvious source of bleeding  - f/u CBC after PRBC transfusion 1/08  Chronic hypotension. - continue midodrine  Severe multi-location osteomyelitis from pressure wounds. - present prior to admission - monitoring off ABx since 1/05 per ID recommendation - wound care - pain control  Cachexia. - tube feeds  AKI from contrast dye and vancomycin. Hypokalemia. - replace K - f/u BMET  Hyperglycemia. - SSI with levemir  Goals of care. -  DNR - palliative care consulted - even though her mental status is improved on 1/08, her prognosis is miserable given extent of her chronic infections   Best practice:  DVT prophylaxis: SQ heparin SUP: protonix Nutrition: tube feeds Mobility: bed rest Goals of care: DNR Disposition: ICU  LABS   CMP  Latest Ref Rng & Units 01/28/2019 01/27/2019 01/26/2019  Glucose 70 - 99 mg/dL 133(H) 121(H) 61(L)  BUN 6 - 20 mg/dL 153(H) 155(H) 145(H)  Creatinine 0.44 - 1.00 mg/dL 1.40(H) 1.42(H) 1.40(H)  Sodium 135 - 145 mmol/L 142 142 139  Potassium 3.5 - 5.1 mmol/L 2.3(LL) 3.6 3.1(L)  Chloride 98 - 111 mmol/L 107 105 104  CO2 22 - 32 mmol/L 20(L) 21(L) 21(L)  Calcium 8.9 - 10.3 mg/dL 6.7(L) 7.2(L) 7.0(L)  Total Protein 6.5 - 8.1 g/dL - - 4.8(L)  Total Bilirubin 0.3 - 1.2 mg/dL - - 1.0  Alkaline Phos 38 - 126 U/L - - 1,358(H)  AST 15 - 41 U/L - - 83(H)  ALT 0 - 44 U/L - - 52(H)    CBC Latest Ref Rng & Units 01/28/2019 01/27/2019 01/26/2019  WBC 4.0 - 10.5 K/uL 12.8(H) 13.2(H) 11.8(H)  Hemoglobin 12.0 - 15.0 g/dL 5.7(LL) 7.0(L) 7.5(L)  Hematocrit 36.0 - 46.0 % 18.3(L) 22.4(L) 23.9(L)  Platelets 150 - 400 K/uL 136(L) 187 209    ABG    Component Value Date/Time   PHART 7.444 01/23/2019 1541   PCO2ART 37.3 01/23/2019 1541   PO2ART 57.0 (L) 01/23/2019 1541   HCO3 25.6 01/23/2019 1541   TCO2 27 01/23/2019 1541   ACIDBASEDEF 10.0 (H) 01/20/2019 2043   O2SAT 91.0 01/23/2019 1541    CBG (last 3)  Recent Labs    01/28/19 0740 01/28/19 0914 01/28/19 1131  GLUCAP 64* 82 127*    CC time 34 minutes  Chesley Mires, MD [place] 01/28/2019, 2:07 PM

## 2019-01-28 NOTE — Progress Notes (Signed)
[place] Progress Note Patient Name: Angela Page DOB: 1970/04/21 MRN: [id number]   Date of Service  01/28/2019  HPI/Events of Note  Hypokalemia - K+ = 2.3 and Creatinine = 1.4. Patient is actively being diuresed with Lasix.   eICU Interventions  Will replace K+.      Intervention Category Major Interventions: Electrolyte abnormality - evaluation and management  Brycen Bean [place] 01/28/2019, 6:06 AM

## 2019-01-28 NOTE — Progress Notes (Signed)
[place] Progress Note Patient Name: Angela Page DOB: 05-05-1970 MRN: [id number]   Date of Service  01/28/2019  HPI/Events of Note  Anemia - Hgb = 7.0 --> 5.7.  eICU Interventions  Will transfuse 2 units PRBC now.      Intervention Category Major Interventions: Other:  Lysle Dingwall 01/28/2019, 5:33 AM

## 2019-01-28 NOTE — Progress Notes (Signed)
 Nutrition Follow-up  DOCUMENTATION CODES:   Not applicable  INTERVENTION:   Tube Feeding:  Increase Vital AF 1.2 to 60 ml/hr  Provides 108 g of protein, 1728 kcals and 1166 mL of free water Meets 100% estimated calorie and protein needs  Juven BID, each packet provides 80 calories, 8 grams of carbohydrate, 2.5  grams of protein (collagen), 7 grams of L-arginine and 7 grams of L-glutamine; supplement contains CaHMB, Vitamins C, E, B12 and Zinc to promote wound healing   NUTRITION DIAGNOSIS:   Increased nutrient needs related to chronic illness, acute illness(acute on chronic hypoxia in the setting of COVID-19 infection with tracheostomy; osteomyelitis of left fibula;greater trochanter, stage IV decubitis ulcer; UTI) as evidenced by estimated needs.  Being addressed via TF   GOAL:   Patient will meet greater than or equal to 90% of their needs  Met via TF  MONITOR:   TF tolerance, Skin, Labs, Weight trends  REASON FOR ASSESSMENT:   Consult Assessment of nutrition requirement/status, Enteral/tube feeding initiation and management  ASSESSMENT:   49 year old female with past medical history of quadriplegia following spinal cord injury with trach and PEG dependency chronically on 3L, chronic diastolic CHF, neurogenic orthostatic hypotension, COPD who presented from facility with concerns of lethargy. In ED, CT abdomen and pelvis showed suprapubic catheter appearing to protrude through posterior bladder wall with fluid in the pelvis; left tibia/fibula x-ray showed osseous demineralization concerning for osteomyelitis with wound care and conservative management and found to be COVID-19 positive.  12/23 Admit 12/29 Transfer to ICU, DNR 12/31 Mucous plug, trach chanted, started vent support 01/03 DNR post palliative care discussion  Pt remains on vent support via trach; pressure support to TC as able Noted pt with very poor prognosis  Vital AF 1.2 infusing at 30 ml/hr, never  increased to goal of 60 ml/hr  Rectal tube has been removed; +BM today  Increase in TF should assist with improving hypoglycemia. May need to consider adjusting insulin regimen as well  Pt with multiple injuries including stage IV sacrum. Noted per MD notes pt with extensive bony destruction of sacrum and throughout the pelvis, Pt also with evidenced of left fibular osteomyelitis.   Labs: CBGs 64-127, potassium 2.3 (L) Meds: ss novolog, novolog with meals, levemir, KCL  Diet Order:   Diet Order            Diet NPO time specified  Diet effective now              EDUCATION NEEDS:   No education needs have been identified at this time  Skin:  Skin Assessment: Skin Integrity Issues: Skin Integrity Issues:: Stage IV, Other (Comment), Stage II Stage II: hip Stage IV: sacrum Other: unstaged pressure injuries to L ischial tuberostiy, L verteberal column  Last BM:  1/8  Height:   Ht Readings from Last 1 Encounters:  01/20/19 '5\' 2"'$  (1.575 m)    Weight:   Wt Readings from Last 1 Encounters:  01/28/19 56.9 kg    Ideal Body Weight:  45 kg  BMI:  Body mass index is 22.94 kg/m.  Estimated Nutritional Needs:   Kcal:  1600-1800  Protein:  90-113 (2-2.5 g/kg)  Fluid:  >/= 1.8 L    Kortlynn Poust MS, RDN, LDN, CNSC [phone] Pager  [phone] Weekend/On-Call Pager

## 2019-01-29 ENCOUNTER — Inpatient Hospital Stay (HOSPITAL_COMMUNITY): Payer: Medicare Other

## 2019-01-29 DIAGNOSIS — J9601 Acute respiratory failure with hypoxia: Secondary | ICD-10-CM

## 2019-01-29 LAB — BASIC METABOLIC PANEL
Anion gap: 13 (ref 5–15)
BUN: 165 mg/dL — ABNORMAL HIGH (ref 6–20)
CO2: 22 mmol/L (ref 22–32)
Calcium: 7.2 mg/dL — ABNORMAL LOW (ref 8.9–10.3)
Chloride: 107 mmol/L (ref 98–111)
Creatinine, Ser: 1.59 mg/dL — ABNORMAL HIGH (ref 0.44–1.00)
GFR calc Af Amer: 44 mL/min — ABNORMAL LOW (ref >60)
GFR calc non Af Amer: 38 mL/min — ABNORMAL LOW (ref >60)
Glucose, Bld: 181 mg/dL — ABNORMAL HIGH (ref 70–99)
Potassium: 4 mmol/L (ref 3.5–5.1)
Sodium: 142 mmol/L (ref 135–145)

## 2019-01-29 LAB — CBC
HCT: 32.2 % — ABNORMAL LOW (ref 36.0–46.0)
Hemoglobin: 10.3 g/dL — ABNORMAL LOW (ref 12.0–15.0)
MCH: 27.8 pg (ref 26.0–34.0)
MCHC: 32 g/dL (ref 30.0–36.0)
MCV: 87 fL (ref 80.0–100.0)
Platelets: 137 10*3/uL — ABNORMAL LOW (ref 150–400)
RBC: 3.7 MIL/uL — ABNORMAL LOW (ref 3.87–5.11)
RDW: 21.3 % — ABNORMAL HIGH (ref 11.5–15.5)
WBC: 14.1 10*3/uL — ABNORMAL HIGH (ref 4.0–10.5)
nRBC: 0.3 % — ABNORMAL HIGH (ref 0.0–0.2)

## 2019-01-29 LAB — TYPE AND SCREEN
ABO/RH(D): A POS
Antibody Screen: NEGATIVE
Unit division: 0
Unit division: 0

## 2019-01-29 LAB — GLUCOSE, CAPILLARY
Glucose-Capillary: 159 mg/dL — ABNORMAL HIGH (ref 70–99)
Glucose-Capillary: 149 mg/dL — ABNORMAL HIGH (ref 70–99)
Glucose-Capillary: 180 mg/dL — ABNORMAL HIGH (ref 70–99)
Glucose-Capillary: 147 mg/dL — ABNORMAL HIGH (ref 70–99)
Glucose-Capillary: 101 mg/dL — ABNORMAL HIGH (ref 70–99)
Glucose-Capillary: 105 mg/dL — ABNORMAL HIGH (ref 70–99)

## 2019-01-29 LAB — BPAM RBC
Blood Product Expiration Date: 202101282359
Blood Product Expiration Date: 202101282359
ISSUE DATE / TIME: 202101080733
ISSUE DATE / TIME: 202101080736
Unit Type and Rh: 6200
Unit Type and Rh: 6200

## 2019-01-29 LAB — POCT I-STAT 7, (LYTES, BLD GAS, ICA,H+H)
Acid-base deficit: 6 mmol/L — ABNORMAL HIGH (ref 0.0–2.0)
Bicarbonate: 21.4 mmol/L (ref 20.0–28.0)
Calcium, Ion: 1.1 mmol/L — ABNORMAL LOW (ref 1.15–1.40)
HCT: 33 % — ABNORMAL LOW (ref 36.0–46.0)
Hemoglobin: 11.2 g/dL — ABNORMAL LOW (ref 12.0–15.0)
O2 Saturation: 94 %
Patient temperature: 96.5
Potassium: 4.1 mmol/L (ref 3.5–5.1)
Sodium: 144 mmol/L (ref 135–145)
TCO2: 23 mmol/L (ref 22–32)
pCO2 arterial: 46.4 mmHg (ref 32.0–48.0)
pH, Arterial: 7.267 — ABNORMAL LOW (ref 7.350–7.450)
pO2, Arterial: 77 mmHg — ABNORMAL LOW (ref 83.0–108.0)

## 2019-01-29 LAB — MAGNESIUM: Magnesium: 1.8 mg/dL (ref 1.7–2.4)

## 2019-01-29 MED ORDER — PHENYLEPHRINE HCL-NACL 10-0.9 MG/250ML-% IV SOLN
0.0000 ug/min | INTRAVENOUS | Status: DC
  Administered 2019-01-29: 23:00:00 10 ug/min via INTRAVENOUS
  Administered 2019-01-29: 07:00:00 20 ug/min via INTRAVENOUS
  Administered 2019-01-29: 11:00:00 30 ug/min via INTRAVENOUS
  Administered 2019-01-30: 17:00:00 10 ug/min via INTRAVENOUS
  Administered 2019-02-02: 01:00:00 2 ug/min via INTRAVENOUS
  Filled 2019-01-29 (×5): qty 250

## 2019-01-29 MED ORDER — GABAPENTIN 250 MG/5ML PO SOLN
100.0000 mg | Freq: Three times a day (TID) | ORAL | Status: DC
  Administered 2019-01-29 – 2019-02-05 (×21): 100 mg
  Filled 2019-01-29 (×25): qty 2

## 2019-01-29 NOTE — Progress Notes (Signed)
Palliative follow up.  I received a call from [name] (mother).  She had missed a call from the hospital and thought it may have been me.    I explained that Letha's BP had dropped to 68/49 this morning and that she was critically ill.  I repeated part of our early conversation and expressed my concerns that Merrisa is dying and will likely die here in the hospital.  [name] was saddened to hear this news as Mekia had had such a good day yesterday.  While she was sad she was not surprised.  I shared that Leanor was now on pressors again.    We talked about what a roller coaster Quaniece and the family have been on.  I asked of Melitta's 48 yo daughter Overton Mam has been able to video with her mother.  [name] told me no.  It seems as though at this point it is too painful for the family to bear witness to [place] suffering.  [name] expressed that she knew Elani would continue to want "full support".   She thanked me for my call and asked for [name] to keep them updated.  My impression is that the family will not make decisions regarding Berdie's care.  Rather, they will follow recommendations from the medical team (Ex. DNR) and in the absence of recommendations from the medical team they will follow Terralyn's previous wishes of continuing full code full scope.  Rashaunda comes of a family of kind, straight forward folks who will not presume to have the medical knowledge to make life and death decisions.  The family [name]) visited [place] last week in person.  She has first hand knowledge of her condition.  The two of [name] have had difficult but very straight forward conversations that overall Jema is not able to get better and she is dying.  Florentina Jenny, PA-C Palliative Medicine Office:  [phone]  15 min.

## 2019-01-29 NOTE — Progress Notes (Signed)
RT talked to patient about going back on the vent and patient stated that she felt fine at this time. RT will continue to monitor.  HR 80 RR 18 SpO2 97%

## 2019-01-29 NOTE — Progress Notes (Signed)
NAME:  Donalyn Torchio, MRN:  [id number], DOB:  Apr 30, 1970, LOS: 67 ADMISSION DATE:  12/29/2018, CONSULTATION DATE:  01/18/19 REFERRING MD:  Neysa Bonito, CHIEF COMPLAINT:  Dyspnea   Brief History   49 yo female with hx of quadriplegia from incomplete C5-C7 fracture presented with osteomyelitis at multiple locations in setting of pressure ulcers.  Found to be COVID 19 positive.  Past Medical History  Asthma, Cocaine abuse, Depression, DM, GERD, Nephrolithiasis, Chronic pain,   Significant Hospital Events   12/23 admit 12/27 palliative care consjult 12/29 limited code; transfer to ICU 12/30 requiring pressors 12/31 altered mental status 01/02 off pressors. On vent. 01/03 Made DNR after palliative care discussion.  01/09 back on pressors  Consults:  ID, urology, wound care  Procedures:  Rt PICC 12/24 >>   Significant Diagnostic Tests:  CT abd/pelvis 12/23 >> hyper-enhancement of GB, gastric wall thickening, suprapubic catheter through posterior bladder wall, large area of ulceration over Lt greater trochanter with progression of osteomyelitis, progression of ulceration over Rt ischial tuberosity with osteomyelitis, focal sacral decubitus ulcer with osteomyelitis Abd u/s 12/26 >> mild GB thickening CT Chest 12/28 >> pulmonary edema, patchy GGO, atherosclerosis Doppler arms and legs b/l 12/30 >> no DVT NM hepatobiliary scan 12/31 >> no evidence of cystic duct obstruction or acute cholecystitis  Micro Data:  SARS CoV2 PCR 12/23 >> positive Urine 12/23 >> Proteus miribalis Blood 12/23 >> negative  COVID Therapy:  Remdesivir 12/23 - 12/27  Antimicrobials:  Vancomycin 12/23 >> 12/29 Cefepime 12/23 >> 1/04 Doxycycline 12/27 >> 12/29 Linezolid 12/31 >> 1/05  Interim history/subjective:  Feels weak.  Back on pressors.  Objective   Blood pressure (!) 83/57, pulse 79, temperature 97.6 F (36.4 C), temperature source Oral, resp. rate 20, height 5\' 2"  (1.575 m), weight 56.7 kg,  last menstrual period 05/27/2016, SpO2 94 %.    Vent Mode: PRVC FiO2 (%):  [40 %-45 %] 40 % Set Rate:  [16 bmp] 16 bmp Vt Set:  [400 mL] 400 mL PEEP:  [5 cmH20] 5 cmH20 Plateau Pressure:  [20 cmH20] 20 cmH20   Intake/Output Summary (Last 24 hours) at 01/29/2019 1120 Last data filed at 01/29/2019 0800 Gross per 24 hour  Intake 1783.98 ml  Output 975 ml  Net 808.98 ml   Filed Weights   01/24/19 0347 01/28/19 0416 01/29/19 0500  Weight: 56 kg 56.9 kg 56.7 kg    General - ill appearing Eyes - pupils reactive ENT - trach site clean Cardiac - regular rate/rhythm, no murmur Chest - scattered rhonchi Abdomen - G tube in place Extremities - contracted Skin - pressure wounds Neuro - somnolent, wakes up and follows simple commands    Resolved Hospital Problem list   Sepsis  Assessment & Plan:   Acute on chronic hypoxic/hypercapnic respiratory failure from COVID 19 pneumonia and aspiration pneumonia. Tracheostomy status. - back on full support  Anemia of critical illness and chronic disease. - no obvious source of bleeding - f/u CBC  Chronic hypotension. - continue midodrine  Severe multi-location osteomyelitis from pressure wounds. - present prior to admission - monitoring off ABx since 1/05 per ID recommendation - wound care - pain control  Cachexia. - tube feeds  AKI from contrast dye and vancomycin. Hypokalemia. - replace K - f/u BMET  Hyperglycemia. - SSI with levemir  Goals of care. - DNR - palliative care consulted - it is only a matter of time before she has worsening infection again, and already back on pressors 1/09.  Pt requested to continue aggressive therapies on 1/08, but I do not see a pathway for her to leave the hospital  Best practice:  DVT prophylaxis: SQ heparin SUP: protonix Nutrition: tube feeds Mobility: bed rest Goals of care: DNR Disposition: ICU  LABS   CMP Latest Ref Rng & Units 01/29/2019 01/29/2019 01/28/2019  Glucose 70 - 99  mg/dL - 181(H) 133(H)  BUN 6 - 20 mg/dL - 165(H) 153(H)  Creatinine 0.44 - 1.00 mg/dL - 1.59(H) 1.40(H)  Sodium 135 - 145 mmol/L 144 142 142  Potassium 3.5 - 5.1 mmol/L 4.1 4.0 2.3(LL)  Chloride 98 - 111 mmol/L - 107 107  CO2 22 - 32 mmol/L - 22 20(L)  Calcium 8.9 - 10.3 mg/dL - 7.2(L) 6.7(L)  Total Protein 6.5 - 8.1 g/dL - - -  Total Bilirubin 0.3 - 1.2 mg/dL - - -  Alkaline Phos 38 - 126 U/L - - -  AST 15 - 41 U/L - - -  ALT 0 - 44 U/L - - -    CBC Latest Ref Rng & Units 01/29/2019 01/29/2019 01/28/2019  WBC 4.0 - 10.5 K/uL - 14.1(H) -  Hemoglobin 12.0 - 15.0 g/dL 11.2(L) 10.3(L) 10.8(L)  Hematocrit 36.0 - 46.0 % 33.0(L) 32.2(L) 34.4(L)  Platelets 150 - 400 K/uL - 137(L) -    ABG    Component Value Date/Time   PHART 7.267 (L) 01/29/2019 0637   PCO2ART 46.4 01/29/2019 0637   PO2ART 77.0 (L) 01/29/2019 0637   HCO3 21.4 01/29/2019 0637   TCO2 23 01/29/2019 0637   ACIDBASEDEF 6.0 (H) 01/29/2019 0637   O2SAT 94.0 01/29/2019 0637    CBG (last 3)  Recent Labs    01/28/19 2331 01/29/19 0405 01/29/19 0729  GLUCAP 180* 159* 149*    CC time 33 minutes   Chesley Mires, MD [place] 01/29/2019, 11:20 AM

## 2019-01-29 NOTE — Progress Notes (Signed)
[facility] [place] Progress Note Patient Name: Angela Page DOB: September 13, 1970 MRN: [id number]   Date of Service  01/29/2019  HPI/Events of Note  Hypotension - BP = 68/49 with MAP = 55.   eICU Interventions  Will order: 1. ABG STAT. 2. Phenylephrine IV infusion. Titrate to MAP >= 55.      Intervention Category Major Interventions: Hypotension - evaluation and management  Jeromey Kruer [place] 01/29/2019, 6:22 AM

## 2019-01-29 NOTE — Progress Notes (Signed)
Tried calling pt's mother.  No answer.  Chesley Mires, MD [facility] Pulmonary/Critical Care 01/29/2019, 11:42 AM

## 2019-01-30 LAB — GLUCOSE, CAPILLARY
Glucose-Capillary: 120 mg/dL — ABNORMAL HIGH (ref 70–99)
Glucose-Capillary: 142 mg/dL — ABNORMAL HIGH (ref 70–99)
Glucose-Capillary: 143 mg/dL — ABNORMAL HIGH (ref 70–99)
Glucose-Capillary: 161 mg/dL — ABNORMAL HIGH (ref 70–99)
Glucose-Capillary: 179 mg/dL — ABNORMAL HIGH (ref 70–99)
Glucose-Capillary: 197 mg/dL — ABNORMAL HIGH (ref 70–99)

## 2019-01-30 LAB — CBC
HCT: 28.7 % — ABNORMAL LOW (ref 36.0–46.0)
Hemoglobin: 9 g/dL — ABNORMAL LOW (ref 12.0–15.0)
MCH: 27.7 pg (ref 26.0–34.0)
MCHC: 31.4 g/dL (ref 30.0–36.0)
MCV: 88.3 fL (ref 80.0–100.0)
Platelets: 153 10*3/uL (ref 150–400)
RBC: 3.25 MIL/uL — ABNORMAL LOW (ref 3.87–5.11)
RDW: 21.7 % — ABNORMAL HIGH (ref 11.5–15.5)
WBC: 16.5 10*3/uL — ABNORMAL HIGH (ref 4.0–10.5)
nRBC: 0.2 % (ref 0.0–0.2)

## 2019-01-30 LAB — BASIC METABOLIC PANEL
Anion gap: 10 (ref 5–15)
BUN: 159 mg/dL — ABNORMAL HIGH (ref 6–20)
CO2: 22 mmol/L (ref 22–32)
Calcium: 7.2 mg/dL — ABNORMAL LOW (ref 8.9–10.3)
Chloride: 112 mmol/L — ABNORMAL HIGH (ref 98–111)
Creatinine, Ser: 1.49 mg/dL — ABNORMAL HIGH (ref 0.44–1.00)
GFR calc Af Amer: 48 mL/min — ABNORMAL LOW (ref >60)
GFR calc non Af Amer: 41 mL/min — ABNORMAL LOW (ref >60)
Glucose, Bld: 137 mg/dL — ABNORMAL HIGH (ref 70–99)
Potassium: 3.5 mmol/L (ref 3.5–5.1)
Sodium: 144 mmol/L (ref 135–145)

## 2019-01-30 MED ORDER — LIP MEDEX EX OINT
TOPICAL_OINTMENT | CUTANEOUS | Status: DC | PRN
  Filled 2019-01-30: qty 7

## 2019-01-30 NOTE — Progress Notes (Signed)
NAME:  Angela Page, MRN:  [id number], DOB:  22-Apr-1970, LOS: 30 ADMISSION DATE:  01/01/2019, CONSULTATION DATE:  01/18/19 REFERRING MD:  Neysa Bonito, CHIEF COMPLAINT:  Dyspnea   Brief History   49 yo female with hx of quadriplegia from incomplete C5-C7 fracture presented with osteomyelitis at multiple locations in setting of pressure ulcers.  Found to be COVID 19 positive.  Past Medical History  Asthma, Cocaine abuse, Depression, DM, GERD, Nephrolithiasis, Chronic pain,   Significant Hospital Events   12/23 admit 12/27 palliative care consjult 12/29 limited code; transfer to ICU 12/30 requiring pressors 12/31 altered mental status 01/02 off pressors. On vent. 01/03 Made DNR after palliative care discussion.  01/09 back on pressors  Consults:  ID, urology, wound care  Procedures:  Rt PICC 12/24 >>   Significant Diagnostic Tests:  CT abd/pelvis 12/23 >> hyper-enhancement of GB, gastric wall thickening, suprapubic catheter through posterior bladder wall, large area of ulceration over Lt greater trochanter with progression of osteomyelitis, progression of ulceration over Rt ischial tuberosity with osteomyelitis, focal sacral decubitus ulcer with osteomyelitis Abd u/s 12/26 >> mild GB thickening CT Chest 12/28 >> pulmonary edema, patchy GGO, atherosclerosis Doppler arms and legs b/l 12/30 >> no DVT NM hepatobiliary scan 12/31 >> no evidence of cystic duct obstruction or acute cholecystitis  Micro Data:  SARS CoV2 PCR 12/23 >> positive Urine 12/23 >> Proteus miribalis Blood 12/23 >> negative  COVID Therapy:  Remdesivir 12/23 - 12/27  Antimicrobials:  Vancomycin 12/23 >> 12/29 Cefepime 12/23 >> 1/04 Doxycycline 12/27 >> 12/29 Linezolid 12/31 >> 1/05  Interim history/subjective:  Had oxygen desaturation when disconnected from vent.  Remains on pressors.  Objective   Blood pressure 122/82, pulse 85, temperature 97.6 F (36.4 C), temperature source Axillary, resp. rate  (!) 23, height 5\' 2"  (1.575 m), weight 56.9 kg, last menstrual period 05/27/2016, SpO2 93 %.    Vent Mode: PRVC FiO2 (%):  [40 %] 40 % Set Rate:  [16 bmp] 16 bmp Vt Set:  [400 mL] 400 mL PEEP:  [5 cmH20] 5 cmH20 Plateau Pressure:  [19 cmH20-25 cmH20] 24 cmH20   Intake/Output Summary (Last 24 hours) at 01/30/2019 1102 Last data filed at 01/30/2019 0900 Gross per 24 hour  Intake 2018.2 ml  Output 1825 ml  Net 193.2 ml   Filed Weights   01/28/19 0416 01/29/19 0500 01/30/19 0500  Weight: 56.9 kg 56.7 kg 56.9 kg     General - cachectic Eyes - pupils reactive ENT - trach site clean Cardiac - regular rate/rhythm, no murmur Chest - b/l rhonchi Abdomen - G tube in place Extremities - decreased muscle bulk Skin - sacral wounds Neuro - contracted    Resolved Hospital Problem list   Sepsis  Assessment & Plan:   Acute on chronic hypoxic/hypercapnic respiratory failure from COVID 19 pneumonia and aspiration pneumonia. Tracheostomy status. - unlikely to be liberated from vent anytime soon if ever  Anemia of critical illness and chronic disease. - no obvious source of bleeding - f/u CBC  Chronic hypotension. - continue midodrine  Severe multi-location osteomyelitis from pressure wounds. - present prior to admission - unlikely these will ever heal - monitoring off ABx since 1/05 per ID recommendation - wound care - pain control  Cachexia. - tube feeds  AKI from contrast dye and vancomycin. Hypokalemia. - f/u BMET  Hyperglycemia. - SSI with levemir  Goals of care. - DNR - palliative care consulted - it is only a matter of time before she has  worsening infection again, and already back on pressors 1/09.  Pt requested to continue aggressive therapies on 1/08, but I do not see a pathway for her to leave the hospital; will try to reach out to her family to discuss further  Best practice:  DVT prophylaxis: SQ heparin SUP: protonix Nutrition: tube feeds Mobility: bed  rest Goals of care: DNR Disposition: ICU  Labs:   CMP Latest Ref Rng & Units 01/30/2019 01/29/2019 01/29/2019  Glucose 70 - 99 mg/dL 137(H) - 181(H)  BUN 6 - 20 mg/dL 159(H) - 165(H)  Creatinine 0.44 - 1.00 mg/dL 1.49(H) - 1.59(H)  Sodium 135 - 145 mmol/L 144 144 142  Potassium 3.5 - 5.1 mmol/L 3.5 4.1 4.0  Chloride 98 - 111 mmol/L 112(H) - 107  CO2 22 - 32 mmol/L 22 - 22  Calcium 8.9 - 10.3 mg/dL 7.2(L) - 7.2(L)  Total Protein 6.5 - 8.1 g/dL - - -  Total Bilirubin 0.3 - 1.2 mg/dL - - -  Alkaline Phos 38 - 126 U/L - - -  AST 15 - 41 U/L - - -  ALT 0 - 44 U/L - - -    CBC Latest Ref Rng & Units 01/30/2019 01/29/2019 01/29/2019  WBC 4.0 - 10.5 K/uL 16.5(H) - 14.1(H)  Hemoglobin 12.0 - 15.0 g/dL 9.0(L) 11.2(L) 10.3(L)  Hematocrit 36.0 - 46.0 % 28.7(L) 33.0(L) 32.2(L)  Platelets 150 - 400 K/uL 153 - 137(L)    ABG    Component Value Date/Time   PHART 7.267 (L) 01/29/2019 0637   PCO2ART 46.4 01/29/2019 0637   PO2ART 77.0 (L) 01/29/2019 0637   HCO3 21.4 01/29/2019 0637   TCO2 23 01/29/2019 0637   ACIDBASEDEF 6.0 (H) 01/29/2019 0637   O2SAT 94.0 01/29/2019 0637    CBG (last 3)  Recent Labs    01/29/19 2328 01/30/19 0313 01/30/19 0820  GLUCAP 105* 120* 197*    CC time 32 minutes   Chesley Mires, MD [place] 01/30/2019, 11:02 AM

## 2019-01-30 NOTE — Progress Notes (Signed)
Spoke with pt's mother.  Explained that main issue is from osteomyelitis, and this will not get better.  In fact will only continue to get worse.  Explained that she can not recover from this, and recommended transition to comfort measures.  Ms. Angela Page understands, but is not ready to give up completely yet.  She would like to continue current therapies for now.  I explained that we would not escalate care beyond what is already being done, and readdress status if she continues to get worse.  Time spent 18 minutes  Chesley Mires, MD [place] 01/30/2019, 3:12 PM

## 2019-01-31 DIAGNOSIS — Z930 Tracheostomy status: Secondary | ICD-10-CM

## 2019-01-31 DIAGNOSIS — Z515 Encounter for palliative care: Secondary | ICD-10-CM

## 2019-01-31 LAB — BASIC METABOLIC PANEL
Anion gap: 12 (ref 5–15)
BUN: 148 mg/dL — ABNORMAL HIGH (ref 6–20)
CO2: 24 mmol/L (ref 22–32)
Calcium: 7.5 mg/dL — ABNORMAL LOW (ref 8.9–10.3)
Chloride: 113 mmol/L — ABNORMAL HIGH (ref 98–111)
Creatinine, Ser: 1.23 mg/dL — ABNORMAL HIGH (ref 0.44–1.00)
GFR calc Af Amer: >60 mL/min (ref >60)
GFR calc non Af Amer: 52 mL/min — ABNORMAL LOW (ref >60)
Glucose, Bld: 176 mg/dL — ABNORMAL HIGH (ref 70–99)
Potassium: 3.5 mmol/L (ref 3.5–5.1)
Sodium: 149 mmol/L — ABNORMAL HIGH (ref 135–145)

## 2019-01-31 LAB — CBC
HCT: 28 % — ABNORMAL LOW (ref 36.0–46.0)
Hemoglobin: 9 g/dL — ABNORMAL LOW (ref 12.0–15.0)
MCH: 27.9 pg (ref 26.0–34.0)
MCHC: 32.1 g/dL (ref 30.0–36.0)
MCV: 86.7 fL (ref 80.0–100.0)
Platelets: 162 10*3/uL (ref 150–400)
RBC: 3.23 MIL/uL — ABNORMAL LOW (ref 3.87–5.11)
RDW: 22.2 % — ABNORMAL HIGH (ref 11.5–15.5)
WBC: 13.7 10*3/uL — ABNORMAL HIGH (ref 4.0–10.5)
nRBC: 0.1 % (ref 0.0–0.2)

## 2019-01-31 LAB — GLUCOSE, CAPILLARY
Glucose-Capillary: 151 mg/dL — ABNORMAL HIGH (ref 70–99)
Glucose-Capillary: 170 mg/dL — ABNORMAL HIGH (ref 70–99)
Glucose-Capillary: 204 mg/dL — ABNORMAL HIGH (ref 70–99)
Glucose-Capillary: 191 mg/dL — ABNORMAL HIGH (ref 70–99)
Glucose-Capillary: 138 mg/dL — ABNORMAL HIGH (ref 70–99)
Glucose-Capillary: 131 mg/dL — ABNORMAL HIGH (ref 70–99)

## 2019-01-31 MED ORDER — POTASSIUM CHLORIDE 20 MEQ/15ML (10%) PO SOLN
40.0000 meq | Freq: Three times a day (TID) | ORAL | Status: AC
  Administered 2019-01-31 (×2): 40 meq
  Filled 2019-01-31 (×2): qty 30

## 2019-01-31 MED ORDER — POTASSIUM CHLORIDE 20 MEQ/15ML (10%) PO SOLN: 20.0000 meq | ORAL | Status: DC

## 2019-01-31 NOTE — Progress Notes (Signed)
Placed pt back in full support due to lethargy

## 2019-01-31 NOTE — Progress Notes (Signed)
NAME:  Angela Page, MRN:  [id number], DOB:  06/01/1970, LOS: 28 ADMISSION DATE:  01/14/2019, CONSULTATION DATE:  01/18/19 REFERRING MD:  Neysa Bonito, CHIEF COMPLAINT:  Dyspnea   Brief History   49 yo female with hx of quadriplegia from incomplete C5-C7 fracture presented with osteomyelitis at multiple locations in setting of pressure ulcers.  Found to be COVID 19 positive.  Past Medical History  Asthma, Cocaine abuse, Depression, DM, GERD, Nephrolithiasis, Chronic pain,   Significant Hospital Events   12/23 admit 12/27 palliative care consjult 12/29 limited code; transfer to ICU 12/30 requiring pressors 12/31 altered mental status 01/02 off pressors. On vent. 01/03 Made DNR after palliative care discussion.  01/09 back on pressors  Consults:  ID, urology, wound care  Procedures:  Rt PICC 12/24 >>   Significant Diagnostic Tests:  CT abd/pelvis 12/23 >> hyper-enhancement of GB, gastric wall thickening, suprapubic catheter through posterior bladder wall, large area of ulceration over Lt greater trochanter with progression of osteomyelitis, progression of ulceration over Rt ischial tuberosity with osteomyelitis, focal sacral decubitus ulcer with osteomyelitis Abd u/s 12/26 >> mild GB thickening CT Chest 12/28 >> pulmonary edema, patchy GGO, atherosclerosis Doppler arms and legs b/l 12/30 >> no DVT NM hepatobiliary scan 12/31 >> no evidence of cystic duct obstruction or acute cholecystitis  Micro Data:  SARS CoV2 PCR 12/23 >> positive Urine 12/23 >> Proteus miribalis Blood 12/23 >> negative  COVID Therapy:  Remdesivir 12/23 - 12/27  Antimicrobials:  Vancomycin 12/23 >> 12/29 Cefepime 12/23 >> 1/04 Doxycycline 12/27 >> 12/29 Linezolid 12/31 >> 1/05  Interim history/subjective:  No events overnight Tolerating T-piece Continues to require pressors  Objective   Blood pressure 106/67, pulse 95, temperature 99.3 F (37.4 C), temperature source Oral, resp. rate (!) 26,  height 5\' 2"  (1.575 m), weight 56.9 kg, last menstrual period 05/27/2016, SpO2 98 %.    Vent Mode: PRVC FiO2 (%):  [40 %-45 %] 40 % Set Rate:  [16 bmp] 16 bmp Vt Set:  [400 mL] 400 mL PEEP:  [5 cmH20] 5 cmH20 Plateau Pressure:  [25 cmH20] 25 cmH20   Intake/Output Summary (Last 24 hours) at 01/31/2019 1029 Last data filed at 01/31/2019 [id number] Gross per 24 hour  Intake 1595.47 ml  Output 1395 ml  Net 200.47 ml   Filed Weights   01/28/19 0416 01/29/19 0500 01/30/19 0500  Weight: 56.9 kg 56.7 kg 56.9 kg   General - cachectic, chronically ill appearing, contracted HEENT: [place]/AT, PERRL, EOM-I and MMM, trach in place Cardiac: RRR, Nl S1/S2 and -M/R/G Chest - Coarse BS diffusely Abdomen - Soft, NT, ND and +BS, G-tube in place Extremities - decreased muscle bulk and contracted Skin - sacral wounds Neuro - contracted  I reviewed CXR myself, trach is in a good position  Resolved Hospital Problem list   Sepsis  Assessment & Plan:   Acute on chronic hypoxic/hypercapnic respiratory failure from COVID 19 pneumonia and aspiration pneumonia. Tracheostomy status. - Maintain on T-piece during the day - Vent at night - Titrate O2 for sat of 88-92%  Anemia of critical illness and chronic disease. - No obvious source of bleeding - No transfusion at this time - CBC in AM  Chronic hypotension. - Continue midodrine - Limit neo to 200 mcg, no further escalation beyond that  Severe multi-location osteomyelitis from pressure wounds. - Present prior to admission - Unlikely these will ever heal - Monitoring off ABx since 1/05 per ID recommendation - Wound care - Pain control  Cachexia. -  Tube feeds per nutrition  AKI from contrast dye and vancomycin. Hypokalemia. - F/u BMET - Replace electrolytes as indicated  Hyperglycemia. - SSI with levemir  Goals of care. - DNR - Palliative care consulted, appreciate input - It is only a matter of time before she has worsening infection  again, and already back on pressors 1/09.  Pt requested to continue aggressive therapies on 1/08, but I do not see a pathway for her to leave the hospital; will try to reach out to her family to discuss further  Best practice:  DVT prophylaxis: SQ heparin SUP: protonix Nutrition: tube feeds Mobility: bed rest Goals of care: DNR Disposition: ICU  Labs:   CMP Latest Ref Rng & Units 01/31/2019 01/30/2019 01/29/2019  Glucose 70 - 99 mg/dL 176(H) 137(H) -  BUN 6 - 20 mg/dL 148(H) 159(H) -  Creatinine 0.44 - 1.00 mg/dL 1.23(H) 1.49(H) -  Sodium 135 - 145 mmol/L 149(H) 144 144  Potassium 3.5 - 5.1 mmol/L 3.5 3.5 4.1  Chloride 98 - 111 mmol/L 113(H) 112(H) -  CO2 22 - 32 mmol/L 24 22 -  Calcium 8.9 - 10.3 mg/dL 7.5(L) 7.2(L) -  Total Protein 6.5 - 8.1 g/dL - - -  Total Bilirubin 0.3 - 1.2 mg/dL - - -  Alkaline Phos 38 - 126 U/L - - -  AST 15 - 41 U/L - - -  ALT 0 - 44 U/L - - -    CBC Latest Ref Rng & Units 01/31/2019 01/30/2019 01/29/2019  WBC 4.0 - 10.5 K/uL 13.7(H) 16.5(H) -  Hemoglobin 12.0 - 15.0 g/dL 9.0(L) 9.0(L) 11.2(L)  Hematocrit 36.0 - 46.0 % 28.0(L) 28.7(L) 33.0(L)  Platelets 150 - 400 K/uL 162 153 -    ABG    Component Value Date/Time   PHART 7.267 (L) 01/29/2019 0637   PCO2ART 46.4 01/29/2019 0637   PO2ART 77.0 (L) 01/29/2019 0637   HCO3 21.4 01/29/2019 0637   TCO2 23 01/29/2019 0637   ACIDBASEDEF 6.0 (H) 01/29/2019 0637   O2SAT 94.0 01/29/2019 0637    CBG (last 3)  Recent Labs    01/30/19 2355 01/31/19 0327 01/31/19 0828  GLUCAP 161* 151* 170*   The patient is critically ill with multiple organ systems failure and requires high complexity decision making for assessment and support, frequent evaluation and titration of therapies, application of advanced monitoring technologies and extensive interpretation of multiple databases.   Critical Care Time devoted to patient care services described in this note is  31  Minutes. This time reflects time of care of this  signee Dr Jennet Maduro. This critical care time does not reflect procedure time, or teaching time or supervisory time of PA/NP/Med student/Med Resident etc but could involve care discussion time.  Rush Farmer, M.D. [facility] Pulmonary/Critical Care Medicine.

## 2019-02-01 LAB — CBC
HCT: 24.8 % — ABNORMAL LOW (ref 36.0–46.0)
Hemoglobin: 7.4 g/dL — ABNORMAL LOW (ref 12.0–15.0)
MCH: 27.5 pg (ref 26.0–34.0)
MCHC: 29.8 g/dL — ABNORMAL LOW (ref 30.0–36.0)
MCV: 92.2 fL (ref 80.0–100.0)
Platelets: 146 10*3/uL — ABNORMAL LOW (ref 150–400)
RBC: 2.69 MIL/uL — ABNORMAL LOW (ref 3.87–5.11)
RDW: 22.9 % — ABNORMAL HIGH (ref 11.5–15.5)
WBC: 11.9 10*3/uL — ABNORMAL HIGH (ref 4.0–10.5)
nRBC: 0 % (ref 0.0–0.2)

## 2019-02-01 LAB — BASIC METABOLIC PANEL
Anion gap: 9 (ref 5–15)
BUN: 136 mg/dL — ABNORMAL HIGH (ref 6–20)
CO2: 23 mmol/L (ref 22–32)
Calcium: 7.1 mg/dL — ABNORMAL LOW (ref 8.9–10.3)
Chloride: 120 mmol/L — ABNORMAL HIGH (ref 98–111)
Creatinine, Ser: 0.98 mg/dL (ref 0.44–1.00)
GFR calc Af Amer: >60 mL/min (ref >60)
GFR calc non Af Amer: >60 mL/min (ref >60)
Glucose, Bld: 154 mg/dL — ABNORMAL HIGH (ref 70–99)
Potassium: 4.4 mmol/L (ref 3.5–5.1)
Sodium: 152 mmol/L — ABNORMAL HIGH (ref 135–145)

## 2019-02-01 LAB — GLUCOSE, CAPILLARY
Glucose-Capillary: 144 mg/dL — ABNORMAL HIGH (ref 70–99)
Glucose-Capillary: 169 mg/dL — ABNORMAL HIGH (ref 70–99)
Glucose-Capillary: 222 mg/dL — ABNORMAL HIGH (ref 70–99)
Glucose-Capillary: 211 mg/dL — ABNORMAL HIGH (ref 70–99)
Glucose-Capillary: 189 mg/dL — ABNORMAL HIGH (ref 70–99)
Glucose-Capillary: 202 mg/dL — ABNORMAL HIGH (ref 70–99)

## 2019-02-01 LAB — MAGNESIUM: Magnesium: 1.4 mg/dL — ABNORMAL LOW (ref 1.7–2.4)

## 2019-02-01 LAB — PHOSPHORUS: Phosphorus: 2 mg/dL — ABNORMAL LOW (ref 2.5–4.6)

## 2019-02-01 MED ORDER — FREE WATER
250.0000 mL | Status: DC
  Administered 2019-02-01 – 2019-02-05 (×25): 250 mL

## 2019-02-01 MED ORDER — SODIUM PHOSPHATES 45 MMOLE/15ML IV SOLN
10.0000 mmol | Freq: Once | INTRAVENOUS | Status: AC
  Administered 2019-02-01: 10 mmol via INTRAVENOUS
  Filled 2019-02-01: qty 3.33

## 2019-02-01 MED ORDER — FENTANYL CITRATE (PF) 100 MCG/2ML IJ SOLN
25.0000 ug | INTRAMUSCULAR | Status: DC | PRN
  Administered 2019-02-01: 23:00:00 50 ug via INTRAVENOUS
  Filled 2019-02-01: qty 2

## 2019-02-01 MED ORDER — MAGNESIUM SULFATE 4 GM/100ML IV SOLN
4.0000 g | Freq: Once | INTRAVENOUS | Status: AC
  Administered 2019-02-01: 4 g via INTRAVENOUS
  Filled 2019-02-01: qty 100

## 2019-02-01 NOTE — Progress Notes (Signed)
Hemoglobin and hematocrit 7.4/24.8 reported to Dr Nelda Marseille

## 2019-02-01 NOTE — Progress Notes (Signed)
Daily Progress Note   Patient Name: Angela Page       Date: 02/01/2019 DOB: December 11, 1970  Age: 49 y.o. MRN#: [id number] Attending Physician: Rush Farmer, MD Primary Care Physician: Default, Provider, MD Admit Date: 12/28/2018  Reason for Consultation/Follow-up: Establishing goals of care  Subjective: Evaluated patient at bedside. She was not alert, did not make eye contact or answer questions. Noted she is now back to requiring pressor support.   Review of Systems  Unable to perform ROS: Acuity of condition    Length of Stay: 20  Current Medications: Scheduled Meds:  . baclofen  5 mg Per Tube TID  . chlorhexidine  15 mL Mouth Rinse BID  . Chlorhexidine Gluconate Cloth  6 each Topical Daily  . free water  250 mL Per Tube Q4H  . gabapentin  100 mg Per Tube Q8H  . heparin injection (subcutaneous)  5,000 Units Subcutaneous Q8H  . insulin aspart  0-24 Units Subcutaneous Q4H  . insulin detemir  5 Units Subcutaneous BID  . mouth rinse  15 mL Mouth Rinse 10 times per day  . midodrine  10 mg Per Tube TID WC  . multivitamin  15 mL Per Tube Daily  . nutrition supplement (JUVEN)  1 packet Per Tube BID BM  . pantoprazole sodium  40 mg Per Tube Daily  . ramelteon  8 mg Oral QHS  . sodium chloride flush  10-40 mL Intracatheter Q12H    Continuous Infusions: . sodium chloride Stopped (02/01/19 0047)  . feeding supplement (VITAL AF 1.2 CAL) 1,000 mL (02/01/19 1433)  . phenylephrine (NEO-SYNEPHRINE) Adult infusion Stopped (01/31/19 0917)    PRN Meds: sodium chloride, lip balm, oxyCODONE-acetaminophen, sodium chloride flush, white petrolatum  Physical Exam Vitals and nursing note reviewed.  Constitutional:      Appearance: She is ill-appearing.     Comments: cachetic    Cardiovascular:     Rate and Rhythm: Normal rate.  Pulmonary:     Comments: On vent support Skin:    Coloration: Skin is pale.  Neurological:     Comments: Did not respond to questions             Vital Signs: BP 98/65   Pulse 90   Temp 99.7 F (37.6 C) (Oral)   Resp (!) 24   Ht  5\' 2"  (1.575 m)   Wt 57.6 kg   LMP 05/27/2016 (Within Days) Comment: Irregular periods since October 2015.  SpO2 92%   BMI 23.23 kg/m  SpO2: SpO2: 92 % O2 Device: O2 Device: Ventilator O2 Flow Rate: O2 Flow Rate (L/min): 10 L/min  Intake/output summary:   Intake/Output Summary (Last 24 hours) at 02/01/2019 1654 Last data filed at 02/01/2019 1600 Gross per 24 hour  Intake 2887.92 ml  Output 1339 ml  Net 1548.92 ml   LBM: Last BM Date: 01/31/19 Baseline Weight: Weight: 46.5 kg Most recent weight: Weight: 57.6 kg       Palliative Assessment/Data: PPS: 10%    Flowsheet Rows     Most Recent Value  Intake Tab  Referral Department  Hospitalist  Unit at Time of Referral  ER  Palliative Care Primary Diagnosis  Sepsis/Infectious Disease  Date Notified  01/14/19  Palliative Care Type  Return patient Palliative Care  Reason for referral  Clarify Goals of Care  Date of Admission  01/10/2019  Date first seen by Palliative Care  01/16/19  # of days Palliative referral response time  2 Day(s)  # of days IP prior to Palliative referral  2  Clinical Assessment  Psychosocial & Spiritual Assessment  Palliative Care Outcomes      Patient Active Problem List   Diagnosis Date Noted  . Chronic osteomyelitis of pelvic region ([place]) 01/25/2019  . Acute respiratory failure ([place])   . Alkaline phosphatase elevation   . DNR (do not resuscitate)   . COVID-19   . Osteomyelitis of left fibula ([place])   . Palliative care encounter   . Hyperkalemia 01/13/2019  . Sepsis ([place]) 01/20/2019  . SBO (small bowel obstruction) ([place]) 08/10/2018  . Rectus sheath hematoma, initial encounter   . Acute encephalopathy  07/20/2018  . Hypercalcemia 06/23/2018  . Thrombocytosis ([place]) 06/22/2018  . Chronic respiratory failure with hypoxia ([place])   . Acute on chronic respiratory failure ([place])   . Quadriplegia and quadriparesis ([place])   . At high risk for severe sepsis   . Decubitus ulcer of buttock, unstageable ([place])   . Lactic acid acidosis 12/16/2017  . Suprapubic catheter ([place]) 12/16/2017  . Pressure injury of skin 07/17/2017  . Kidney stone 07/16/2017  . Right ureteral stone 03/10/2017  . Complicated UTI (urinary tract infection) 03/09/2017  . Ineffective airway clearance   . Sepsis secondary to UTI ([place]) 06/30/2016  . Autonomic neuropathy 06/10/2016  . Presence of permanent cardiac pacemaker 06/10/2016  . 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 Chronically on opiate therapy 06/10/2016  . PEG (percutaneous endoscopic gastrostomy) status ([place]) 06/10/2016  . Pulmonary hypertension ([place]) 06/10/2016  . Hypoalbuminemia 06/10/2016  . Peripheral edema   . Tracheostomy dependence ([place])   . Chronic obstructive pulmonary disease ([place])   . Chronic diastolic CHF (congestive heart failure) ([place]) 01/23/2016  . Tracheostomy status ([place])   . Esophageal dysphagia   . Malnutrition of moderate degree 11/01/2015  . Tobacco abuse 10/31/2015  . Asthma 10/31/2015  . History of pneumonia 10/08/2015  . Pressure ulcer of contiguous region involving buttock and hip, stage 4 ([place]) 02/26/2015  . Symptomatic anemia 02/25/2015  . Altered mental status 10/29/2014  . Acute metabolic encephalopathy [id number]  . Leukocytosis 10/23/2014  . Hypokalemia 10/23/2014  . Decubitus ulcer of sacral region, stage 4 ([place]) 10/23/2014  . Anemia, iron deficiency 10/23/2014  . Quadriplegia ([place]) 10/22/2014  . Depression   . Protein-calorie malnutrition, severe ([place]) 09/05/2014  . Neurogenic orthostatic  hypotension ([place]) 08/04/2014  . Recurrent UTI 03/31/2014  . Pressure ulcer of coccygeal region, stage 4 ([place]) 01/06/2014    . Stage 4 skin ulcer of sacral region ([place]) 01/06/2014  . Neuropathic pain 11/30/2013  . Paraplegia following spinal cord injury ([place]) 11/30/2013  . PEG (percutaneous endoscopic gastrostomy) adjustment/replacement/removal ([place]) 11/30/2013  . Spinal cord injury, cervical region ([place]) 11/30/2013  . Chronic pain due to injury 11/30/2013  . S/P cervical spinal fusion 10/27/2013  . Tracheostomy care ([place]) 10/27/2013  . 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 SCI (spinal cord injury) 10/11/2013    Palliative Care Assessment & Plan   Patient Profile: 40 yowith past medical history of quadriplegia, tracheostomy, PEG, suprapubic catheter, stage IV sacral decubitus and osteomyelitis in severalareas and wasadmitted on 12/23/2020with COVID infection and cellulitis on her back. Imaging showed her suprapubic catheter had punctured her posterior bladder wall leaving fluid in the pelvis and osteomyelitis had progressed into her fibula.She continues to decline in ICU and limited options for antibiotic treatment. Overall prognosis is extremely poor.  Assessment/Recommendations/Plan   Attempted to call patient's mother- left message requesting return call  Discussed with Dr. Nelda Marseille- current plan is to continue current level of care without escalation- poor prognosis  Goals of Care and Additional Recommendations:  Limitations on Scope of Treatment: Full Scope Treatment  Code Status:  DNR  Prognosis:   Unable to determine  Discharge Planning:  To Be Determined  Care plan was discussed with Dr. Nelda Marseille.  Thank you for allowing the Palliative Medicine Team to assist in the care of this patient.   Time In: 1000 Time Out: 1025 Total Time 25 mins Prolonged Time Billed no      Greater than 50%  of this time was spent counseling and coordinating care related to the above assessment and plan.  Mariana Kaufman, AGNP-C Palliative Medicine   Please contact Palliative Medicine Team phone  at [phone] for questions and concerns.

## 2019-02-01 NOTE — Progress Notes (Signed)
[place] Progress Note Patient Name: Angela Page DOB: [birth year] MRN: [id number]   Date of Service  02/01/2019  HPI/Events of Note  RN requesting pain medication prior to painful dressing changes   eICU Interventions  Fentanyl 25-62mcg IV q2hr PRN pain and/or prior to dressing changes      Intervention Category Minor Interventions: Routine modifications to care plan (e.g. PRN medications for pain, fever)  Darlina Sicilian 02/01/2019, 10:47 PM

## 2019-02-01 NOTE — Progress Notes (Signed)
RT note: patient meets SBT criteria however upon arrival for AM assessment patient sats were noted to be 89%.  Suctioned moderate amount of thick, red secretions from trach and patient noted to have fine crackles for RT this AM.  Patient's sats now at 92%.  Will hold SBT for now.  RT will continue to monitor.

## 2019-02-01 NOTE — Progress Notes (Signed)
RT note: patient placed back on full support ventilator settings due to increased respiratory rate.  Tolerating well at this time.  Will continue to monitor.

## 2019-02-01 NOTE — Progress Notes (Signed)
NAME:  Angela Page, MRN:  [id number], DOB:  08/24/1970, LOS: 75 ADMISSION DATE:  12/27/2018, CONSULTATION DATE:  01/18/19 REFERRING MD:  Neysa Bonito, CHIEF COMPLAINT:  Dyspnea   Brief History   49 yo female with hx of quadriplegia from incomplete C5-C7 fracture presented with osteomyelitis at multiple locations in setting of pressure ulcers.  Found to be COVID 19 positive.  Past Medical History  Asthma, Cocaine abuse, Depression, DM, GERD, Nephrolithiasis, Chronic pain,   Significant Hospital Events   12/23 admit 12/27 palliative care consjult 12/29 limited code; transfer to ICU 12/30 requiring pressors 12/31 altered mental status 01/02 off pressors. On vent. 01/03 Made DNR after palliative care discussion.  01/09 back on pressors  Consults:  ID, urology, wound care  Procedures:  Rt PICC 12/24 >>   Significant Diagnostic Tests:  CT abd/pelvis 12/23 >> hyper-enhancement of GB, gastric wall thickening, suprapubic catheter through posterior bladder wall, large area of ulceration over Lt greater trochanter with progression of osteomyelitis, progression of ulceration over Rt ischial tuberosity with osteomyelitis, focal sacral decubitus ulcer with osteomyelitis Abd u/s 12/26 >> mild GB thickening CT Chest 12/28 >> pulmonary edema, patchy GGO, atherosclerosis Doppler arms and legs b/l 12/30 >> no DVT NM hepatobiliary scan 12/31 >> no evidence of cystic duct obstruction or acute cholecystitis  Micro Data:  SARS CoV2 PCR 12/23 >> positive Urine 12/23 >> Proteus miribalis Blood 12/23 >> negative  COVID Therapy:  Remdesivir 12/23 - 12/27  Antimicrobials:  Vancomycin 12/23 >> 12/29 Cefepime 12/23 >> 1/04 Doxycycline 12/27 >> 12/29 Linezolid 12/31 >> 1/05  Interim history/subjective:  Became very lethargic overnight and had to be placed back on full vent support Episodic hypotension responded to IVF and Neo  Objective   Blood pressure (!) 85/51, pulse 97, temperature 98.6  F (37 C), temperature source Oral, resp. rate (!) 30, height 5\' 2"  (1.575 m), weight 57.6 kg, last menstrual period 05/27/2016, SpO2 93 %.    Vent Mode: PRVC FiO2 (%):  [40 %] 40 % Set Rate:  [16 bmp] 16 bmp Vt Set:  [400 mL] 400 mL PEEP:  [5 cmH20] 5 cmH20 Plateau Pressure:  [21 cmH20-24 cmH20] 24 cmH20   Intake/Output Summary (Last 24 hours) at 02/01/2019 [id number] Last data filed at 02/01/2019 [id number] Gross per 24 hour  Intake 2142.21 ml  Output 1226 ml  Net 916.21 ml   Filed Weights   01/29/19 0500 01/30/19 0500 02/01/19 0500  Weight: 56.7 kg 56.9 kg 57.6 kg   General - Chronically ill appearing female, on vent, NAD, contracted HEENT: [place]/AT, PERRL, EOM-I and MMM, trach in place Cardiac: RRR, Nl S1/S2 and -M/R/G Chest - Coarse BS diffusely Abdomen - Soft, NT, ND and +BS Extremities - decreased muscle bulk and contracted Skin - sacral wounds Neuro - Awake and interactive but contracted  I reviewed CXR myself, trach is in a good position  Resolved Hospital Problem list   Sepsis  Assessment & Plan:   Acute on chronic hypoxic/hypercapnic respiratory failure from COVID 19 pneumonia and aspiration pneumonia. Tracheostomy status. - Attempt PS today again and if successful then will attempt T-piece again with a filter (COVID) - Vent at night only if needed - Titrate O2 for sat of 88-92%  Anemia of critical illness and chronic disease. - Monitor for signs of bleeding - Hold transfusion at thios time - CBC in AM  Chronic hypotension. - Continue midodrine - Neo still going, will accept SBP of 90 as long as mentating and limit neo  to 200 mcg, no further escalation  Severe multi-location osteomyelitis from pressure wounds. - Present prior to admission - Unlikely these will ever heal - Monitoring off ABx since 1/05 per ID recommendation - Wound care - Pain control  Cachexia. - Tube feeds per nutrition  AKI from contrast dye and vancomycin. Hypokalemia. - F/u BMET -  Replace electrolytes as indicated  Hyperglycemia. - SSI with levemir  Goals of care. - DNR - Palliative care consulted, appreciate input - It is only a matter of time before she has worsening infection again, and already back on pressors 1/09.  Pt requested to continue aggressive therapies on 1/08, but I do not see a pathway for her to leave the hospital; will try to reach out to her family to discuss further  Best practice:  DVT prophylaxis: SQ heparin SUP: protonix Nutrition: tube feeds Mobility: bed rest Goals of care: DNR Disposition: ICU  Labs:   CMP Latest Ref Rng & Units 02/01/2019 01/31/2019 01/30/2019  Glucose 70 - 99 mg/dL 154(H) 176(H) 137(H)  BUN 6 - 20 mg/dL 136(H) 148(H) 159(H)  Creatinine 0.44 - 1.00 mg/dL 0.98 1.23(H) 1.49(H)  Sodium 135 - 145 mmol/L 152(H) 149(H) 144  Potassium 3.5 - 5.1 mmol/L 4.4 3.5 3.5  Chloride 98 - 111 mmol/L 120(H) 113(H) 112(H)  CO2 22 - 32 mmol/L 23 24 22   Calcium 8.9 - 10.3 mg/dL 7.1(L) 7.5(L) 7.2(L)  Total Protein 6.5 - 8.1 g/dL - - -  Total Bilirubin 0.3 - 1.2 mg/dL - - -  Alkaline Phos 38 - 126 U/L - - -  AST 15 - 41 U/L - - -  ALT 0 - 44 U/L - - -    CBC Latest Ref Rng & Units 02/01/2019 01/31/2019 01/30/2019  WBC 4.0 - 10.5 K/uL 11.9(H) 13.7(H) 16.5(H)  Hemoglobin 12.0 - 15.0 g/dL 7.4(L) 9.0(L) 9.0(L)  Hematocrit 36.0 - 46.0 % 24.8(L) 28.0(L) 28.7(L)  Platelets 150 - 400 K/uL 146(L) 162 153    ABG    Component Value Date/Time   PHART 7.267 (L) 01/29/2019 0637   PCO2ART 46.4 01/29/2019 0637   PO2ART 77.0 (L) 01/29/2019 0637   HCO3 21.4 01/29/2019 0637   TCO2 23 01/29/2019 0637   ACIDBASEDEF 6.0 (H) 01/29/2019 0637   O2SAT 94.0 01/29/2019 0637    CBG (last 3)  Recent Labs    01/31/19 1923 01/31/19 2311 02/01/19 0308  GLUCAP 138* 131* 144*   The patient is critically ill with multiple organ systems failure and requires high complexity decision making for assessment and support, frequent evaluation and titration of  therapies, application of advanced monitoring technologies and extensive interpretation of multiple databases.   Critical Care Time devoted to patient care services described in this note is  31  Minutes. This time reflects time of care of this signee Dr Jennet Maduro. This critical care time does not reflect procedure time, or teaching time or supervisory time of PA/NP/Med student/Med Resident etc but could involve care discussion time.  Rush Farmer, M.D. [facility] Pulmonary/Critical Care Medicine.

## 2019-02-01 NOTE — Progress Notes (Signed)
RT note: patient placed on CPAP/PSV of 10/5.  Goal keep sats >88%.  Will continue to monitor.

## 2019-02-02 ENCOUNTER — Inpatient Hospital Stay (HOSPITAL_COMMUNITY): Payer: Medicare Other

## 2019-02-02 DIAGNOSIS — R6521 Severe sepsis with septic shock: Secondary | ICD-10-CM

## 2019-02-02 DIAGNOSIS — A419 Sepsis, unspecified organism: Secondary | ICD-10-CM

## 2019-02-02 DIAGNOSIS — Z7189 Other specified counseling: Secondary | ICD-10-CM

## 2019-02-02 DIAGNOSIS — M8669 Other chronic osteomyelitis, multiple sites: Secondary | ICD-10-CM

## 2019-02-02 DIAGNOSIS — M869 Osteomyelitis, unspecified: Secondary | ICD-10-CM

## 2019-02-02 LAB — BASIC METABOLIC PANEL
Anion gap: 10 (ref 5–15)
BUN: 137 mg/dL — ABNORMAL HIGH (ref 6–20)
CO2: 25 mmol/L (ref 22–32)
Calcium: 7.4 mg/dL — ABNORMAL LOW (ref 8.9–10.3)
Chloride: 115 mmol/L — ABNORMAL HIGH (ref 98–111)
Creatinine, Ser: 1 mg/dL (ref 0.44–1.00)
GFR calc Af Amer: >60 mL/min (ref >60)
GFR calc non Af Amer: >60 mL/min (ref >60)
Glucose, Bld: 167 mg/dL — ABNORMAL HIGH (ref 70–99)
Potassium: 4.1 mmol/L (ref 3.5–5.1)
Sodium: 150 mmol/L — ABNORMAL HIGH (ref 135–145)

## 2019-02-02 LAB — CBC
HCT: 26 % — ABNORMAL LOW (ref 36.0–46.0)
Hemoglobin: 7.8 g/dL — ABNORMAL LOW (ref 12.0–15.0)
MCH: 27.6 pg (ref 26.0–34.0)
MCHC: 30 g/dL (ref 30.0–36.0)
MCV: 91.9 fL (ref 80.0–100.0)
Platelets: 212 10*3/uL (ref 150–400)
RBC: 2.83 MIL/uL — ABNORMAL LOW (ref 3.87–5.11)
RDW: 22.8 % — ABNORMAL HIGH (ref 11.5–15.5)
WBC: 14.6 10*3/uL — ABNORMAL HIGH (ref 4.0–10.5)
nRBC: 0.2 % (ref 0.0–0.2)

## 2019-02-02 LAB — GLUCOSE, CAPILLARY
Glucose-Capillary: 147 mg/dL — ABNORMAL HIGH (ref 70–99)
Glucose-Capillary: 148 mg/dL — ABNORMAL HIGH (ref 70–99)
Glucose-Capillary: 171 mg/dL — ABNORMAL HIGH (ref 70–99)

## 2019-02-02 LAB — PHOSPHORUS: Phosphorus: 2.1 mg/dL — ABNORMAL LOW (ref 2.5–4.6)

## 2019-02-02 LAB — MAGNESIUM: Magnesium: 2.2 mg/dL (ref 1.7–2.4)

## 2019-02-02 MED ORDER — MORPHINE SULFATE (PF) 2 MG/ML IV SOLN
1.0000 mg | INTRAVENOUS | Status: DC | PRN
  Administered 2019-02-02: 14:00:00 2 mg via INTRAVENOUS
  Filled 2019-02-02: qty 1

## 2019-02-02 MED ORDER — POTASSIUM PHOSPHATES 15 MMOLE/5ML IV SOLN
20.0000 mmol | Freq: Once | INTRAVENOUS | Status: DC
  Administered 2019-02-02: 11:00:00 20 mmol via INTRAVENOUS
  Filled 2019-02-02: qty 6.67

## 2019-02-02 MED ORDER — MORPHINE SULFATE (PF) 2 MG/ML IV SOLN
2.0000 mg | INTRAVENOUS | Status: DC | PRN
  Administered 2019-02-03: 06:00:00 2 mg via INTRAVENOUS
  Administered 2019-02-03: 12:00:00 4 mg via INTRAVENOUS
  Administered 2019-02-03: 10:00:00 3 mg via INTRAVENOUS
  Filled 2019-02-02: qty 1
  Filled 2019-02-02 (×2): qty 2

## 2019-02-02 MED ORDER — ATROPINE SULFATE 1 % OP SOLN
4.0000 [drp] | OPHTHALMIC | Status: DC | PRN
  Filled 2019-02-02: qty 2

## 2019-02-02 MED ORDER — LORAZEPAM 2 MG/ML IJ SOLN
1.0000 mg | INTRAMUSCULAR | Status: DC | PRN
  Administered 2019-02-02: 1 mg via INTRAVENOUS
  Filled 2019-02-02: qty 1

## 2019-02-02 MED ORDER — FUROSEMIDE 10 MG/ML IJ SOLN
20.0000 mg | Freq: Three times a day (TID) | INTRAMUSCULAR | Status: AC
  Administered 2019-02-02 (×2): 20 mg via INTRAVENOUS
  Filled 2019-02-02 (×2): qty 2

## 2019-02-02 MED ORDER — LORAZEPAM 2 MG/ML IJ SOLN
1.0000 mg | INTRAMUSCULAR | Status: DC | PRN
  Administered 2019-02-02 – 2019-02-05 (×4): 2 mg via INTRAVENOUS
  Filled 2019-02-02 (×4): qty 1

## 2019-02-02 NOTE — Progress Notes (Signed)
   02/02/19 1500  Clinical Encounter Type  Visited With Patient;Family  Visit Type Patient actively dying  Referral From Nurse  Consult/Referral To Chaplain  Spiritual Encounters  Spiritual Needs Prayer;Emotional  Stress Factors  Patient Stress Factors Loss  Family Stress Factors Lack of knowledge   Chaplain responded to consult for end of life. Angela Page's mother was on the facetime screen. Chaplain attempted to chat with mom but is unsure if mom was able to hear chaplain. Mom said she will call back tomorrow. Chaplain is not sure mother understands the seriousness of Angela Page's condition. Chaplain visited with Angela Page and prayed with her. Riya would open her eyes to Chaplain's words and did give a sigh. Chaplain encouraged Angela Page to rest knowing she is safe and in good hands. Chaplains remain available for support as needs arise. Especially if family arrived for end of life visit.   Chaplain Resident, Evelene Croon, M Div [place] [phone] on-call

## 2019-02-02 NOTE — Progress Notes (Signed)
NAME:  Angela Page, MRN:  [id number], DOB:  1970-10-06, LOS: 21 ADMISSION DATE:  12/30/2018, CONSULTATION DATE:  01/18/19 REFERRING MD:  Neysa Bonito, CHIEF COMPLAINT:  Dyspnea   Brief History   49 yo female with hx of quadriplegia from incomplete C5-C7 fracture presented with osteomyelitis at multiple locations in setting of pressure ulcers.  Found to be COVID 19 positive.  Past Medical History  Asthma, Cocaine abuse, Depression, DM, GERD, Nephrolithiasis, Chronic pain,   Significant Hospital Events   12/23 admit 12/27 palliative care consjult 12/29 limited code; transfer to ICU 12/30 requiring pressors 12/31 altered mental status 01/02 off pressors. On vent. 01/03 Made DNR after palliative care discussion.  01/09 back on pressors  Consults:  ID, urology, wound care  Procedures:  Rt PICC 12/24 >>   Significant Diagnostic Tests:  CT abd/pelvis 12/23 >> hyper-enhancement of GB, gastric wall thickening, suprapubic catheter through posterior bladder wall, large area of ulceration over Lt greater trochanter with progression of osteomyelitis, progression of ulceration over Rt ischial tuberosity with osteomyelitis, focal sacral decubitus ulcer with osteomyelitis Abd u/s 12/26 >> mild GB thickening CT Chest 12/28 >> pulmonary edema, patchy GGO, atherosclerosis Doppler arms and legs b/l 12/30 >> no DVT NM hepatobiliary scan 12/31 >> no evidence of cystic duct obstruction or acute cholecystitis  Micro Data:  SARS CoV2 PCR 12/23 >> positive Urine 12/23 >> Proteus miribalis Blood 12/23 >> negative  COVID Therapy:  Remdesivir 12/23 - 12/27  Antimicrobials:  Vancomycin 12/23 >> 12/29 Cefepime 12/23 >> 1/04 Doxycycline 12/27 >> 12/29 Linezolid 12/31 >> 1/05  Interim history/subjective:  Was unable to wean beyond PS then around 10 AM on 1/12 failed weaning again and had to be placed back on full support Continues to require Neo for BP support overnight Frequent episodes of pain  during dressing change  Objective   Blood pressure (!) 88/61, pulse 80, temperature 98.6 F (37 C), temperature source Oral, resp. rate (!) 23, height 5\' 2"  (1.575 m), weight 57.8 kg, last menstrual period 05/27/2016, SpO2 96 %.    Vent Mode: PRVC FiO2 (%):  [40 %] 40 % Set Rate:  [16 bmp] 16 bmp Vt Set:  [400 mL] 400 mL PEEP:  [5 cmH20] 5 cmH20 Pressure Support:  [10 cmH20] 10 cmH20 Plateau Pressure:  [22 cmH20-26 cmH20] 22 cmH20   Intake/Output Summary (Last 24 hours) at 02/02/2019 0734 Last data filed at 02/02/2019 0600 Gross per 24 hour  Intake 3786.01 ml  Output 1340 ml  Net 2446.01 ml   Filed Weights   01/30/19 0500 02/01/19 0500 02/02/19 0359  Weight: 56.9 kg 57.6 kg 57.8 kg   General - Chronically ill appearing female, painful distress when examining decubs HEENT: [place]/AT, PERRL, EOM-I and MMM, trach is in a good position Cardiac: RRR, Nl S1/S2 and -M/R/G Chest - Diminished diffusely Abdomen - Soft, NT, ND and +BS Extremities - Decreased bulk, contracted Skin - sacral wounds Neuro - Awake and interactive but contracted  I reviewed CXR myself, trach is in a good position  Discussed with bedside RN and RT  Resolved Hospital Problem list   Sepsis  Assessment & Plan:   Acute on chronic hypoxic/hypercapnic respiratory failure from COVID 19 pneumonia and aspiration pneumonia. Tracheostomy status. - Will attempt PS trials again today but with fluid overload doubt will tolerate - Very low dose lasix for volume overload but watch for hypernatremia - Maintain on PRVC only if needed - Titrate O2 for sat of 88-92%  Anemia of critical  illness and chronic disease. - Monitor for signs of bleeding - Hold off transfusion at this time - CBC in AM  Chronic hypotension. - Continue middorine - Neo for SBP of 90, not to go beyond 200, no further escalation of care  Severe multi-location osteomyelitis from pressure wounds. - Present prior to admission - Doubt will ever  heal - Monitoring off ABx since 1/05 per ID recommendation - Wound care following - Pain control via fentanyl 25-50 q2 and with wound dressing but avoid sedation  Cachexia. - Tube feeds per nutrition  AKI from contrast dye and vancomycin. Hypokalemia. Resolved Hypernatremia High BUN - F/u BMET - Replace electrolytes as indicated - Free water - Low dose lasix - Check for GI bleed given very high BUN if able to get card to check  Hyperglycemia. - SSI with levemir  Goals of care. - DNR - Palliative care consulted, appreciate input, called mother on 1/12 with no response - PCCM-MD will attempt again today to evaluate GOC here and recommend d/c neo.  But in the meantime, as established before no further escalation of care beyond above  Called mother again on 1/13 and received a response.  Informed that patient is making no headway and the wounds are not and will not heal.  After a long discussion decision was made to focus more on comfort, dc neo but keep everything the same.  Will libralize morphine use.  Dc weaning.  Dc neo.  Hold in the icu for now.  Best practice:  DVT prophylaxis: SQ heparin SUP: protonix Nutrition: tube feeds Mobility: bed rest Goals of care: DNR Disposition: ICU  Labs:   CMP Latest Ref Rng & Units 02/02/2019 02/01/2019 01/31/2019  Glucose 70 - 99 mg/dL 167(H) 154(H) 176(H)  BUN 6 - 20 mg/dL 137(H) 136(H) 148(H)  Creatinine 0.44 - 1.00 mg/dL 1.00 0.98 1.23(H)  Sodium 135 - 145 mmol/L 150(H) 152(H) 149(H)  Potassium 3.5 - 5.1 mmol/L 4.1 4.4 3.5  Chloride 98 - 111 mmol/L 115(H) 120(H) 113(H)  CO2 22 - 32 mmol/L 25 23 24   Calcium 8.9 - 10.3 mg/dL 7.4(L) 7.1(L) 7.5(L)  Total Protein 6.5 - 8.1 g/dL - - -  Total Bilirubin 0.3 - 1.2 mg/dL - - -  Alkaline Phos 38 - 126 U/L - - -  AST 15 - 41 U/L - - -  ALT 0 - 44 U/L - - -    CBC Latest Ref Rng & Units 02/02/2019 02/01/2019 01/31/2019  WBC 4.0 - 10.5 K/uL 14.6(H) 11.9(H) 13.7(H)  Hemoglobin 12.0 - 15.0  g/dL 7.8(L) 7.4(L) 9.0(L)  Hematocrit 36.0 - 46.0 % 26.0(L) 24.8(L) 28.0(L)  Platelets 150 - 400 K/uL 212 146(L) 162    ABG    Component Value Date/Time   PHART 7.267 (L) 01/29/2019 0637   PCO2ART 46.4 01/29/2019 0637   PO2ART 77.0 (L) 01/29/2019 0637   HCO3 21.4 01/29/2019 0637   TCO2 23 01/29/2019 0637   ACIDBASEDEF 6.0 (H) 01/29/2019 0637   O2SAT 94.0 01/29/2019 0637    CBG (last 3)  Recent Labs    02/01/19 2318 02/02/19 0311 02/02/19 0728  GLUCAP 202* 147* 148*   The patient is critically ill with multiple organ systems failure and requires high complexity decision making for assessment and support, frequent evaluation and titration of therapies, application of advanced monitoring technologies and extensive interpretation of multiple databases.   Critical Care Time devoted to patient care services described in this note is  32  Minutes. This time reflects  time of care of this signee Dr Jennet Maduro. This critical care time does not reflect procedure time, or teaching time or supervisory time of PA/NP/Med student/Med Resident etc but could involve care discussion time.  Rush Farmer, M.D. [facility] Pulmonary/Critical Care Medicine.

## 2019-02-02 NOTE — Progress Notes (Signed)
Assisted tele visit to patient with mother.  Kaikoa Magro M Cyler Kappes, RN   

## 2019-02-02 NOTE — Progress Notes (Signed)
Do not wean on ventilator per CCM, RT will continue to monitor pt for comfort.

## 2019-02-02 NOTE — Progress Notes (Addendum)
I have talked with patient's mom [name] and told her she can visit her daughter . She has decided to video conference via [place]. . The hospital chaplain is in the room to assist I called and updated [name] that her daughter's blood pressure was decreasing and that there was a possibility she might could not live thru tonight if she wanted to come visit.

## 2019-02-02 NOTE — Progress Notes (Signed)
Daily Progress Note   Patient Name: Angela Page       Date: 02/02/2019 DOB: 24-Aug-1970  Age: 49 y.o. MRN#: [id number] Attending Physician: Rush Farmer, MD Primary Care Physician: Default, Provider, MD Admit Date: 01/06/2019  Reason for Consultation/Follow-up: Establishing goals of care  Subjective: Met with patient at bedside. She was awake and alert.  She requested mouthcare and this was performed.  Patient cannot phonate, but is able to verbalize with whisper and lip reading with return confirmation of what she says. Patient told me she was scared she was dying- then she asked me if she was dying. I told her that while our hope was that we could treat the things that were treatable, my worry is that we no longer can, and yes, she is dying. Patient nodded in understanding.  She stated she was scared that she would suffer and that dying would hurt. Gave her assurance that we could do our best to ensure that she did not suffer.  We discussed plan of care going for. For now she would like to stop labs, and other medications that are not intended for comfort- continue on ventilator and tube feedings as long as she is awake and alert. Provide medications with no limit that will bring her comfort. When she is no longer awake and alert, and the vent is no longer a factor in her comfort- then she wishes to stop the vent and other prolonging measures and allow her body to die. She would like a visit from her mother.   During our discussion she c/o feeling air hunger. She was suctioned and respiratory settings reviewed by her nurse. She maintained adequate saturations, however, continued to feel distressed. Discussed with her nurse and recommended lorazepam.   I called her mother and relayed my  conversation with [name]. [name] was very understanding and in agreement with this plan. [name] requested to visit with patient's daughterOverton Mam. She first requested to come on Friday. I encouraged her to come sooner if she wished to see patient awake and alert. She stated she will attempt video visit today and may come tomorrow- she will arrange with floor.   Review of Systems  Unable to perform ROS: Acuity of condition    Length of Stay: 21  Current Medications: Scheduled Meds:  . baclofen  5 mg Per Tube TID  . chlorhexidine  15 mL Mouth Rinse BID  . Chlorhexidine Gluconate Cloth  6 each Topical Daily  . free water  250 mL Per Tube Q4H  . furosemide  20 mg Intravenous Q8H  . gabapentin  100 mg Per Tube Q8H  . mouth rinse  15 mL Mouth Rinse 10 times per day  . multivitamin  15 mL Per Tube Daily  . pantoprazole sodium  40 mg Per Tube Daily  . ramelteon  8 mg Oral QHS  . sodium chloride flush  10-40 mL Intracatheter Q12H    Continuous Infusions: . sodium chloride 10 mL/hr at 02/02/19 1200  . feeding supplement (VITAL AF 1.2 CAL) 60 mL/hr at 02/02/19 1200    PRN Meds: sodium chloride, atropine, lip balm, LORazepam, morphine injection, sodium chloride flush, white petrolatum  Physical Exam Vitals and nursing note reviewed.  Constitutional:      Comments: cachectic  Cardiovascular:     Rate and Rhythm: Normal rate.  Pulmonary:     Effort: Pulmonary effort is normal.     Breath sounds: Normal breath sounds.  Musculoskeletal:        General: Swelling present.     Comments: Bilateral upper wrist contractures and swelling  Neurological:     Mental Status: Mental status is at baseline.  Psychiatric:        Mood and Affect: Mood normal.        Behavior: Behavior normal.        Thought Content: Thought content normal.        Judgment: Judgment normal.             Vital Signs: BP (!) 82/67   Pulse 80   Temp 97.7 F (36.5 C) (Oral)   Resp (!) 23   Ht '5\' 2"'$  (1.575 m)    Wt 57.8 kg   LMP 05/27/2016 (Within Days) Comment: Irregular periods since October 2015.  SpO2 94%   BMI 23.31 kg/m  SpO2: SpO2: 94 % O2 Device: O2 Device: Ventilator O2 Flow Rate: O2 Flow Rate (L/min): 10 L/min  Intake/output summary:   Intake/Output Summary (Last 24 hours) at 02/02/2019 1457 Last data filed at 02/02/2019 1400 Gross per 24 hour  Intake 3647.33 ml  Output 1435 ml  Net 2212.33 ml   LBM: Last BM Date: 01/31/19 Baseline Weight: Weight: 46.5 kg Most recent weight: Weight: 57.8 kg       Palliative Assessment/Data: PPS: 10%   Flowsheet Rows     Most Recent Value  Intake Tab  Referral Department  Hospitalist  Unit at Time of Referral  ER  Palliative Care Primary Diagnosis  Sepsis/Infectious Disease  Date Notified  01/14/19  Palliative Care Type  Return patient Palliative Care  Reason for referral  Clarify Goals of Care  Date of Admission  12/28/2018  Date first seen by Palliative Care  01/16/19  # of days Palliative referral response time  2 Day(s)  # of days IP prior to Palliative referral  2  Clinical Assessment  Psychosocial & Spiritual Assessment  Palliative Care Outcomes      Patient Active Problem List   Diagnosis Date Noted  . Chronic osteomyelitis of pelvic region ([place]) 01/25/2019  . Acute respiratory failure ([place])   . Alkaline phosphatase elevation   . DNR (do not resuscitate)   . COVID-19   . Osteomyelitis of left fibula ([place])   . Palliative care encounter   . Hyperkalemia 01/13/2019  . Sepsis ([place])  12/28/2018  . SBO (small bowel obstruction) ([place]) 08/10/2018  . Rectus sheath hematoma, initial encounter   . Acute encephalopathy 07/20/2018  . Hypercalcemia 06/23/2018  . Thrombocytosis ([place]) 06/22/2018  . Chronic respiratory failure with hypoxia ([place])   . Acute on chronic respiratory failure ([place])   . Quadriplegia and quadriparesis ([place])   . At high risk for severe sepsis   . Decubitus ulcer of buttock, unstageable ([place])   . Lactic acid  acidosis 12/16/2017  . Suprapubic catheter ([place]) 12/16/2017  . Pressure injury of skin 07/17/2017  . Kidney stone 07/16/2017  . Right ureteral stone 03/10/2017  . Complicated UTI (urinary tract infection) 03/09/2017  . Ineffective airway clearance   . Sepsis secondary to UTI ([place]) 06/30/2016  . Autonomic neuropathy 06/10/2016  . Presence of permanent cardiac pacemaker 06/10/2016  . 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 Chronically on opiate therapy 06/10/2016  . PEG (percutaneous endoscopic gastrostomy) status ([place]) 06/10/2016  . Pulmonary hypertension ([place]) 06/10/2016  . Hypoalbuminemia 06/10/2016  . Peripheral edema   . Tracheostomy dependence ([place])   . Chronic obstructive pulmonary disease ([place])   . Chronic diastolic CHF (congestive heart failure) ([place]) 01/23/2016  . Tracheostomy status ([place])   . Esophageal dysphagia   . Malnutrition of moderate degree 11/01/2015  . Tobacco abuse 10/31/2015  . Asthma 10/31/2015  . History of pneumonia 10/08/2015  . Pressure ulcer of contiguous region involving buttock and hip, stage 4 ([place]) 02/26/2015  . Symptomatic anemia 02/25/2015  . Altered mental status 10/29/2014  . Acute metabolic encephalopathy 74/25/9563  . Leukocytosis 10/23/2014  . Hypokalemia 10/23/2014  . Decubitus ulcer of sacral region, stage 4 ([place]) 10/23/2014  . Anemia, iron deficiency 10/23/2014  . Quadriplegia ([place]) 10/22/2014  . Depression   . Protein-calorie malnutrition, severe ([place]) 09/05/2014  . 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 Recurrent UTI 03/31/2014  . Pressure ulcer of coccygeal region, stage 4 ([place]) 01/06/2014  . Stage 4 skin ulcer of sacral region ([place]) 01/06/2014  . Neuropathic pain 11/30/2013  . Paraplegia following spinal cord injury ([place]) 11/30/2013  . PEG (percutaneous endoscopic gastrostomy) adjustment/replacement/removal ([place]) 11/30/2013  . Spinal cord injury, cervical region ([place]) 11/30/2013  . Chronic pain due  to injury 11/30/2013  . S/P cervical spinal fusion 10/27/2013  . Tracheostomy care ([place]) 10/27/2013  . 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 SCI (spinal cord injury) 10/11/2013    Palliative Care Assessment & Plan   Patient Profile: 66 yowith past medical history of quadriplegia, tracheostomy, PEG, suprapubic catheter, stage IV sacral decubitus and osteomyelitis in severalareas and wasadmitted on 12/23/2020with COVID infection and cellulitis on her back. Imaging showed her suprapubic catheter had punctured her posterior bladder wall leaving fluid in the pelvis and osteomyelitis had progressed into her fibula.She continues to decline in ICU and limited options for antibiotic treatment. Overall prognosis is extremely poor.  Assessment/Recommendations/Plan   Continue ventilator without weaning  Continue tube feeding  D/C labs, CBGs, medications that are not comfort focused  Lorazepam 1-'2mg'$  q4hr prn anxiety or SOB unrelieved with morphine  Morphine 2-'4mg'$  q30 min prn for SOB or anxiety  Goals of Care and Additional Recommendations:  Limitations on Scope of Treatment: Minimize Medications, No Diagnostics, No Glucose Monitoring, No IV Antibiotics, No IV Fluids, No Lab Draws and No Surgical Procedures  Code Status:  DNR  Prognosis:   < 2 weeks - more likely days to a week.  Discharge Planning:  Anticipated Hospital Death  Care plan was discussed with patient, her  mother, and Phyliss, RN  Thank you for allowing the Palliative Medicine Team to assist in the care of this patient.   Time In: 1400 Time Out: 1505 Total Time 65 minutes Prolonged Time Billed yes      Greater than 50%  of this time was spent counseling and coordinating care related to the above assessment and plan.  Mariana Kaufman, AGNP-C Palliative Medicine   Please contact Palliative Medicine Team phone at [phone] for questions and concerns.

## 2019-02-03 MED ORDER — MORPHINE SULFATE (PF) 4 MG/ML IV SOLN
4.0000 mg | INTRAVENOUS | Status: DC
  Administered 2019-02-03 – 2019-02-05 (×11): 4 mg via INTRAVENOUS
  Filled 2019-02-03 (×11): qty 1

## 2019-02-03 MED ORDER — MORPHINE SULFATE (PF) 4 MG/ML IV SOLN
4.0000 mg | INTRAVENOUS | Status: DC | PRN
  Administered 2019-02-03 – 2019-02-05 (×4): 4 mg via INTRAVENOUS
  Filled 2019-02-03 (×4): qty 1

## 2019-02-03 MED ORDER — DIPHENHYDRAMINE HCL 50 MG/ML IJ SOLN
12.5000 mg | Freq: Four times a day (QID) | INTRAMUSCULAR | Status: DC | PRN
  Administered 2019-02-03 (×2): 12.5 mg via INTRAVENOUS
  Filled 2019-02-03 (×2): qty 1

## 2019-02-03 NOTE — Progress Notes (Signed)
NAME:  Angela Page, MRN:  [id number], DOB:  17-Jun-1970, LOS: 8 ADMISSION DATE:  01/08/2019, CONSULTATION DATE:  01/18/19 REFERRING MD:  Neysa Bonito, CHIEF COMPLAINT:  Dyspnea   Brief History   49 yo female with hx of quadriplegia from incomplete C5-C7 fracture presented with osteomyelitis at multiple locations in setting of pressure ulcers.  Found to be COVID 19 positive.  Past Medical History  Asthma, Cocaine abuse, Depression, DM, GERD, Nephrolithiasis, Chronic pain,   Significant Hospital Events   12/23 admit 12/27 palliative care consjult 12/29 limited code; transfer to ICU 12/30 requiring pressors 12/31 altered mental status 01/02 off pressors. On vent. 01/03 Made DNR after palliative care discussion.  01/09 back on pressors  Consults:  ID, urology, wound care  Procedures:  Rt PICC 12/24 >>   Significant Diagnostic Tests:  CT abd/pelvis 12/23 >> hyper-enhancement of GB, gastric wall thickening, suprapubic catheter through posterior bladder wall, large area of ulceration over Lt greater trochanter with progression of osteomyelitis, progression of ulceration over Rt ischial tuberosity with osteomyelitis, focal sacral decubitus ulcer with osteomyelitis Abd u/s 12/26 >> mild GB thickening CT Chest 12/28 >> pulmonary edema, patchy GGO, atherosclerosis Doppler arms and legs b/l 12/30 >> no DVT NM hepatobiliary scan 12/31 >> no evidence of cystic duct obstruction or acute cholecystitis  Micro Data:  SARS CoV2 PCR 12/23 >> positive Urine 12/23 >> Proteus miribalis Blood 12/23 >> negative  COVID Therapy:  Remdesivir 12/23 - 12/27  Antimicrobials:  Vancomycin 12/23 >> 12/29 Cefepime 12/23 >> 1/04 Doxycycline 12/27 >> 12/29 Linezolid 12/31 >> 1/05  Interim history/subjective:  Overnight events noted Patient continues to be hypotensive  Objective   Blood pressure (!) 81/67, pulse 80, temperature (!) 96.7 F (35.9 C), temperature source Axillary, resp. rate (!) 22,  height 5\' 2"  (1.575 m), weight 57.8 kg, last menstrual period 05/27/2016, SpO2 98 %.    Vent Mode: PRVC FiO2 (%):  [40 %] 40 % Set Rate:  [16 bmp] 16 bmp Vt Set:  [400 mL] 400 mL PEEP:  [5 cmH20] 5 cmH20 Pressure Support:  [5 cmH20] 5 cmH20 Plateau Pressure:  [22 cmH20-28 cmH20] 28 cmH20   Intake/Output Summary (Last 24 hours) at 02/03/2019 0731 Last data filed at 02/03/2019 0700 Gross per 24 hour  Intake 2543 ml  Output 2021 ml  Net 522 ml   Filed Weights   01/30/19 0500 02/01/19 0500 02/02/19 0359  Weight: 56.9 kg 57.6 kg 57.8 kg   General - Chronically ill appearing female, not weaning on the vent and poorly responsive HEENT: [place]/AT, PERRL, EOM-I and MMM, trach in place Cardiac: RRR, Nl S1/S2 and -M/R/G Chest - Decreased BS diffusely Abdomen - Soft, NT, ND and hypoactive BS Extremities - Decreased bulk, contracted  Skin - Severe unhealing sacral wounds Neuro - Lethargic, contracted, opens eyes  I reviewed CXR myself, trach is in a good position  Discussed with RT and RN  Resolved Hospital Problem list   Sepsis  Assessment & Plan:   Acute on chronic hypoxic/hypercapnic respiratory failure from COVID 19 pneumonia and aspiration pneumonia. Tracheostomy status. - Hold further weaning at this point - D/C further diureses - Titrate O2 for comfort at this point - Morphine and ativan for comfort per palliative care, appreciate input  Anemia of critical illness and chronic disease. - Monitor for signs of bleeding - No further transfusion - Will d/c further blood draws at this point  Chronic hypotension. - Continue midodrine - D/C Neo  Severe multi-location osteomyelitis from  pressure wounds. - Present prior to admission and non-healing - Doubt will ever heal - No further abx - Wound care only for comfort at this point, no further painful treatments - Morphine and ativan for pain control  Cachexia. - TF only for comfort purposes at this point  AKI from contrast  dye and vancomycin. Hypokalemia. Resolved Hypernatremia High BUN - D/C further blood draws - No further replacement of electrolytes - Free water only to prevent dehydration for comfort pusposes - D/C lasix  Hyperglycemia. - D/C CBG checks  Goals of care. - DNR - Appreciate PCM help - Per discussion with mother on 1/13 focus on comfort  Best practice:  DVT prophylaxis: SQ heparin SUP: protonix Nutrition: tube feeds Mobility: bed rest Goals of care: DNR Disposition: ICU  Labs:   CMP Latest Ref Rng & Units 02/02/2019 02/01/2019 01/31/2019  Glucose 70 - 99 mg/dL 167(H) 154(H) 176(H)  BUN 6 - 20 mg/dL 137(H) 136(H) 148(H)  Creatinine 0.44 - 1.00 mg/dL 1.00 0.98 1.23(H)  Sodium 135 - 145 mmol/L 150(H) 152(H) 149(H)  Potassium 3.5 - 5.1 mmol/L 4.1 4.4 3.5  Chloride 98 - 111 mmol/L 115(H) 120(H) 113(H)  CO2 22 - 32 mmol/L 25 23 24   Calcium 8.9 - 10.3 mg/dL 7.4(L) 7.1(L) 7.5(L)  Total Protein 6.5 - 8.1 g/dL - - -  Total Bilirubin 0.3 - 1.2 mg/dL - - -  Alkaline Phos 38 - 126 U/L - - -  AST 15 - 41 U/L - - -  ALT 0 - 44 U/L - - -    CBC Latest Ref Rng & Units 02/02/2019 02/01/2019 01/31/2019  WBC 4.0 - 10.5 K/uL 14.6(H) 11.9(H) 13.7(H)  Hemoglobin 12.0 - 15.0 g/dL 7.8(L) 7.4(L) 9.0(L)  Hematocrit 36.0 - 46.0 % 26.0(L) 24.8(L) 28.0(L)  Platelets 150 - 400 K/uL 212 146(L) 162    ABG    Component Value Date/Time   PHART 7.267 (L) 01/29/2019 0637   PCO2ART 46.4 01/29/2019 0637   PO2ART 77.0 (L) 01/29/2019 0637   HCO3 21.4 01/29/2019 0637   TCO2 23 01/29/2019 0637   ACIDBASEDEF 6.0 (H) 01/29/2019 0637   O2SAT 94.0 01/29/2019 0637    CBG (last 3)  Recent Labs    02/02/19 0311 02/02/19 0728 02/02/19 1219  GLUCAP 147* 148* 171*   The patient is critically ill with multiple organ systems failure and requires high complexity decision making for assessment and support, frequent evaluation and titration of therapies, application of advanced monitoring technologies and  extensive interpretation of multiple databases.   Critical Care Time devoted to patient care services described in this note is  31  Minutes. This time reflects time of care of this signee Dr Jennet Maduro. This critical care time does not reflect procedure time, or teaching time or supervisory time of PA/NP/Med student/Med Resident etc but could involve care discussion time.  Rush Farmer, M.D. [facility] Pulmonary/Critical Care Medicine.

## 2019-02-03 NOTE — Progress Notes (Signed)
Daily Progress Note   Patient Name: Angela Page       Date: 02/03/2019 DOB: [birth year]  Age: 49 y.o. MRN#: [id number] Attending Physician: Rush Farmer, MD Primary Care Physician: Default, Provider, MD Admit Date: 01/18/2019  Reason for Consultation/Follow-up: Establishing goals of care  Subjective: Patient awake and alert. Denied pain. Requested mouth care and carmex. Reported itching on her mouth and neck. Stated she was a little sleepy.   Review of Systems  Unable to perform ROS: Acuity of condition    Length of Stay: 22  Current Medications: Scheduled Meds:  . baclofen  5 mg Per Tube TID  . chlorhexidine  15 mL Mouth Rinse BID  . Chlorhexidine Gluconate Cloth  6 each Topical Daily  . free water  250 mL Per Tube Q4H  . gabapentin  100 mg Per Tube Q8H  . mouth rinse  15 mL Mouth Rinse 10 times per day  .  morphine injection  4 mg Intravenous Q4H  . multivitamin  15 mL Per Tube Daily  . pantoprazole sodium  40 mg Per Tube Daily  . ramelteon  8 mg Oral QHS  . sodium chloride flush  10-40 mL Intracatheter Q12H    Continuous Infusions: . sodium chloride 10 mL/hr at 02/02/19 1200  . feeding supplement (VITAL AF 1.2 CAL) 60 mL/hr at 02/02/19 1200    PRN Meds: sodium chloride, atropine, diphenhydrAMINE, lip balm, LORazepam, morphine injection, sodium chloride flush, white petrolatum  Physical Exam Vitals and nursing note reviewed.  Constitutional:      Comments: Cachetic,   Musculoskeletal:     Comments: Upper wrist contractures  Skin:    Coloration: Skin is pale.  Neurological:     Mental Status: She is alert.     Comments: quadraplegic  Psychiatric:        Mood and Affect: Mood normal.        Behavior: Behavior normal.        Thought Content: Thought  content normal.             Vital Signs: BP 95/83   Pulse 80   Temp 98.1 F (36.7 C) (Oral)   Resp (!) 24   Ht 5\' 2"  (1.575 m)   Wt 57.8 kg   LMP 05/27/2016 (Within Days) Comment: Irregular periods since October  2015.  SpO2 98%   BMI 23.31 kg/m  SpO2: SpO2: 98 % O2 Device: O2 Device: Ventilator O2 Flow Rate: O2 Flow Rate (L/min): 10 L/min  Intake/output summary:   Intake/Output Summary (Last 24 hours) at 02/03/2019 1245 Last data filed at 02/03/2019 0700 Gross per 24 hour  Intake 1684.58 ml  Output 1701 ml  Net -16.42 ml   LBM: Last BM Date: 02/02/19 Baseline Weight: Weight: 46.5 kg Most recent weight: Weight: 57.8 kg       Palliative Assessment/Data: PPS: 10%    Flowsheet Rows      Most Recent Value  Intake Tab  Referral Department  Hospitalist  Unit at Time of Referral  ER  Palliative Care Primary Diagnosis  Sepsis/Infectious Disease  Date Notified  01/14/19  Palliative Care Type  Return patient Palliative Care  Reason for referral  Clarify Goals of Care  Date of Admission  01/11/2019  Date first seen by Palliative Care  01/16/19  # of days Palliative referral response time  2 Day(s)  # of days IP prior to Palliative referral  2  Clinical Assessment  Psychosocial & Spiritual Assessment  Palliative Care Outcomes       Patient Active Problem List   Diagnosis Date Noted  . Osteomyelitis ([place])   . Advanced care planning/counseling discussion   . Goals of care, counseling/discussion   . Chronic osteomyelitis of pelvic region ([place]) 01/25/2019  . Acute respiratory failure ([place])   . Alkaline phosphatase elevation   . DNR (do not resuscitate)   . COVID-19   . Osteomyelitis of left fibula ([place])   . Palliative care encounter   . Hyperkalemia 01/13/2019  . Sepsis ([place]) 12/22/2018  . SBO (small bowel obstruction) ([place]) 08/10/2018  . Rectus sheath hematoma, initial encounter   . Acute encephalopathy 07/20/2018  . Hypercalcemia 06/23/2018  . Thrombocytosis  ([place]) 06/22/2018  . Chronic respiratory failure with hypoxia ([place])   . Acute on chronic respiratory failure ([place])   . Quadriplegia and quadriparesis ([place])   . At high risk for severe sepsis   . Decubitus ulcer of buttock, unstageable ([place])   . Lactic acid acidosis 12/16/2017  . Suprapubic catheter ([place]) 12/16/2017  . Pressure injury of skin 07/17/2017  . Kidney stone 07/16/2017  . Right ureteral stone 03/10/2017  . Complicated UTI (urinary tract infection) 03/09/2017  . Ineffective airway clearance   . Sepsis secondary to UTI ([place]) 06/30/2016  . Autonomic neuropathy 06/10/2016  . Presence of permanent cardiac pacemaker 06/10/2016  . 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 Chronically on opiate therapy 06/10/2016  . PEG (percutaneous endoscopic gastrostomy) status ([place]) 06/10/2016  . Pulmonary hypertension ([place]) 06/10/2016  . Hypoalbuminemia 06/10/2016  . Peripheral edema   . Tracheostomy dependence ([place])   . Chronic obstructive pulmonary disease ([place])   . Chronic diastolic CHF (congestive heart failure) ([place]) 01/23/2016  . Tracheostomy status ([place])   . Esophageal dysphagia   . Malnutrition of moderate degree 11/01/2015  . Tobacco abuse 10/31/2015  . Asthma 10/31/2015  . History of pneumonia 10/08/2015  . Pressure ulcer of contiguous region involving buttock and hip, stage 4 ([place]) 02/26/2015  . Symptomatic anemia 02/25/2015  . Altered mental status 10/29/2014  . Acute metabolic encephalopathy [id number]  . Leukocytosis 10/23/2014  . Hypokalemia 10/23/2014  . Decubitus ulcer of sacral region, stage 4 ([place]) 10/23/2014  . Anemia, iron deficiency 10/23/2014  . Quadriplegia ([place]) 10/22/2014  . Depression   . Protein-calorie malnutrition, severe ([place]) 09/05/2014  . Neurogenic  orthostatic hypotension ([place]) 08/04/2014  . Recurrent UTI 03/31/2014  . Pressure ulcer of coccygeal region, stage 4 ([place]) 01/06/2014  . Stage 4 skin ulcer of sacral region ([place]) 01/06/2014  .  Neuropathic pain 11/30/2013  . Paraplegia following spinal cord injury ([place]) 11/30/2013  . PEG (percutaneous endoscopic gastrostomy) adjustment/replacement/removal ([place]) 11/30/2013  . Spinal cord injury, cervical region ([place]) 11/30/2013  . Chronic pain due to injury 11/30/2013  . S/P cervical spinal fusion 10/27/2013  . Tracheostomy care ([place]) 10/27/2013  . 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 SCI (spinal cord injury) 10/11/2013    Palliative Care Assessment & Plan   Patient Profile: 63 yowith past medical history of quadriplegia, tracheostomy, PEG, suprapubic catheter, stage IV sacral decubitus and osteomyelitis in severalareas and wasadmitted on 12/23/2020with COVID infection and cellulitis on her back. Imaging showed her suprapubic catheter had punctured her posterior bladder wall leaving fluid in the pelvis and osteomyelitis had progressed into her fibula.She continues to decline in ICU and limited options for antibiotic treatment. Overall prognosis is extremely poor.  Assessment/Recommendations/Plan   Itching- possibly from morphine- 12.5mg  Benadryl IV q6hr prn  Will schedule IV morphine 4mg  q4hr continue q30 prn morphine  Continue comfort measures in addition to tube feeding and vent while patient is awake and alert. If she becomes unresponsive, then plan for d/c of vent and tube feedings.   Goals of Care and Additional Recommendations:  Limitations on Scope of Treatment: Minimize Medications, No Diagnostics, No IV Antibiotics, No IV Fluids and No Lab Draws  Code Status:  DNR  Prognosis:   < 2 weeks  Discharge Planning:  Anticipated Hospital Death  Care plan was discussed with patient and her mother, and patient's nurse.   Thank you for allowing the Palliative Medicine Team to assist in the care of this patient.   Time In: 1210 Time Out: 1245 Total Time 35 minutes Prolonged Time Billed no      Greater than 50%  of this time was spent counseling and  coordinating care related to the above assessment and plan.  Mariana Kaufman, AGNP-C Palliative Medicine   Please contact Palliative Medicine Team phone at [phone] for questions and concerns.

## 2019-02-03 NOTE — Progress Notes (Signed)
Nutrition Brief Note  Chart reviewed. DNR with focus on comfort starting 1/13 TF continues but for "comfort purposes only". If pt experiences any TF intolerance, TF should be discontinued No further nutrition interventions warranted at this time.   Please re-consult as needed.   [employer] MS, RDN, LDN, CNSC [phone] Pager  [phone] Weekend/On-Call Pager

## 2019-02-04 NOTE — Progress Notes (Signed)
NAME:  Angela Page, MRN:  [id number], DOB:  1970-03-16, LOS: 23 ADMISSION DATE:  12/26/2018, CONSULTATION DATE:  01/18/19 REFERRING MD:  Neysa Bonito, CHIEF COMPLAINT:  Dyspnea   Brief History   49 yo female with hx of quadriplegia from incomplete C5-C7 fracture presented with osteomyelitis at multiple locations in setting of pressure ulcers.  Found to be COVID 19 positive.  Past Medical History  Asthma, Cocaine abuse, Depression, DM, GERD, Nephrolithiasis, Chronic pain,   Significant Hospital Events   12/23 admit 12/27 palliative care consjult 12/29 limited code; transfer to ICU 12/30 requiring pressors 12/31 altered mental status 01/02 off pressors. On vent. 01/03 Made DNR after palliative care discussion.  01/09 back on pressors  Consults:  ID, urology, wound care  Procedures:  Rt PICC 12/24 >>   Significant Diagnostic Tests:  CT abd/pelvis 12/23 >> hyper-enhancement of GB, gastric wall thickening, suprapubic catheter through posterior bladder wall, large area of ulceration over Lt greater trochanter with progression of osteomyelitis, progression of ulceration over Rt ischial tuberosity with osteomyelitis, focal sacral decubitus ulcer with osteomyelitis Abd u/s 12/26 >> mild GB thickening CT Chest 12/28 >> pulmonary edema, patchy GGO, atherosclerosis Doppler arms and legs b/l 12/30 >> no DVT NM hepatobiliary scan 12/31 >> no evidence of cystic duct obstruction or acute cholecystitis  Micro Data:  SARS CoV2 PCR 12/23 >> positive Urine 12/23 >> Proteus miribalis Blood 12/23 >> negative  COVID Therapy:  Remdesivir 12/23 - 12/27  Antimicrobials:  Vancomycin 12/23 >> 12/29 Cefepime 12/23 >> 1/04 Doxycycline 12/27 >> 12/29 Linezolid 12/31 >> 1/05  Interim history/subjective:  Hypotensive this AM No events overnight  Objective   Blood pressure (!) 67/49, pulse 80, temperature 97.8 F (36.6 C), temperature source Oral, resp. rate (!) 21, height 5\' 2"  (1.575 m),  weight 57.8 kg, last menstrual period 05/27/2016, SpO2 98 %.    Vent Mode: PRVC FiO2 (%):  [40 %] 40 % Set Rate:  [16 bmp] 16 bmp Vt Set:  [400 mL] 400 mL PEEP:  [5 cmH20] 5 cmH20 Plateau Pressure:  [16 cmH20-26 cmH20] 18 cmH20   Intake/Output Summary (Last 24 hours) at 02/04/2019 [id number] Last data filed at 02/04/2019 0600 Gross per 24 hour  Intake 1890 ml  Output 685 ml  Net 1205 ml   Filed Weights   01/30/19 0500 02/01/19 0500 02/02/19 0359  Weight: 56.9 kg 57.6 kg 57.8 kg   General - Chronically ill appearing female, lethargic and slow to respond HEENT: [place]/AT, PERRL, EOM-I and DMM, trach in place Cardiac: RRR, Nl S1/S2 and -M/R/G Chest - Decreased BS diffusely on the vent Abdomen - Soft, NT, ND and hypoactive bowel sounds Extremities - -edema and -tenderness, contracted Skin - Severe unhealing sacral wounds Neuro - Lethargic, contracted, opens eyes  I reviewed CXR myself, trach is in a good position  Discussed with bedside RN and RT  Resolved Hospital Problem list   Sepsis  Assessment & Plan:   Acute on chronic hypoxic/hypercapnic respiratory failure from COVID 19 pneumonia and aspiration pneumonia. Tracheostomy status. - No further weaning at this point - D/C lasix - Titrate O2 for comfort at this point - Morphine and ativan for comfort per palliative care, appreciate input  Anemia of critical illness and chronic disease. - Monitor for signs of bleeding - No further transfusion - D/C AM CBC  Chronic hypotension. - D/C midodrine - D/C Neo  Severe multi-location osteomyelitis from pressure wounds. - Present prior to admission and non-healing - Doubt will ever heal -  No further abx - Wound care only for comfort at this point, no further painful treatments - Morphine and ativan for pain control  Cachexia. - TF only for comfort purposes at this point  AKI from contrast dye and vancomycin. Hypokalemia. Resolved Hypernatremia High BUN - D/C further blood  draws - No further replacement of electrolytes - Free water only to prevent dehydration for comfort pusposes - D/C lasix  Hyperglycemia. - D/C CBG checks  Goals of care. - DNR - Appreciate PCM help - Per discussion with mother on 1/13 focus on comfort  Will d/c COVID isolation, transfer to 70M for family to visit given EOL condition  Best practice:  DVT prophylaxis: SQ heparin SUP: protonix Nutrition: tube feeds Mobility: bed rest Goals of care: DNR Disposition: ICU  Labs:   CMP Latest Ref Rng & Units 02/02/2019 02/01/2019 01/31/2019  Glucose 70 - 99 mg/dL 167(H) 154(H) 176(H)  BUN 6 - 20 mg/dL 137(H) 136(H) 148(H)  Creatinine 0.44 - 1.00 mg/dL 1.00 0.98 1.23(H)  Sodium 135 - 145 mmol/L 150(H) 152(H) 149(H)  Potassium 3.5 - 5.1 mmol/L 4.1 4.4 3.5  Chloride 98 - 111 mmol/L 115(H) 120(H) 113(H)  CO2 22 - 32 mmol/L 25 23 24   Calcium 8.9 - 10.3 mg/dL 7.4(L) 7.1(L) 7.5(L)  Total Protein 6.5 - 8.1 g/dL - - -  Total Bilirubin 0.3 - 1.2 mg/dL - - -  Alkaline Phos 38 - 126 U/L - - -  AST 15 - 41 U/L - - -  ALT 0 - 44 U/L - - -    CBC Latest Ref Rng & Units 02/02/2019 02/01/2019 01/31/2019  WBC 4.0 - 10.5 K/uL 14.6(H) 11.9(H) 13.7(H)  Hemoglobin 12.0 - 15.0 g/dL 7.8(L) 7.4(L) 9.0(L)  Hematocrit 36.0 - 46.0 % 26.0(L) 24.8(L) 28.0(L)  Platelets 150 - 400 K/uL 212 146(L) 162    ABG    Component Value Date/Time   PHART 7.267 (L) 01/29/2019 0637   PCO2ART 46.4 01/29/2019 0637   PO2ART 77.0 (L) 01/29/2019 0637   HCO3 21.4 01/29/2019 0637   TCO2 23 01/29/2019 0637   ACIDBASEDEF 6.0 (H) 01/29/2019 0637   O2SAT 94.0 01/29/2019 0637    CBG (last 3)  Recent Labs    02/02/19 0311 02/02/19 0728 02/02/19 1219  GLUCAP 147* 148* 171*   The patient is critically ill with multiple organ systems failure and requires high complexity decision making for assessment and support, frequent evaluation and titration of therapies, application of advanced monitoring technologies and extensive  interpretation of multiple databases.   Critical Care Time devoted to patient care services described in this note is  32  Minutes. This time reflects time of care of this signee Dr Jennet Maduro. This critical care time does not reflect procedure time, or teaching time or supervisory time of PA/NP/Med student/Med Resident etc but could involve care discussion time.  Rush Farmer, M.D. [facility] Pulmonary/Critical Care Medicine.

## 2019-02-04 NOTE — Progress Notes (Signed)
  Day 22 of hospitalization.  Can COVID restrictions be DC'd allowing family to have longer and more frequent visits?

## 2019-02-04 NOTE — Progress Notes (Signed)
RT NOTE: RT transported patient on ventilator from room 3M01 to room 2M14 with no apparent complications. Vitals are stable. RT will continue to monitor.

## 2019-02-05 DIAGNOSIS — Z515 Encounter for palliative care: Secondary | ICD-10-CM

## 2019-02-05 LAB — GLUCOSE, CAPILLARY: Glucose-Capillary: 294 mg/dL — ABNORMAL HIGH (ref 70–99)

## 2019-02-05 MED ORDER — POLYVINYL ALCOHOL 1.4 % OP SOLN
1.0000 [drp] | Freq: Four times a day (QID) | OPHTHALMIC | Status: DC | PRN
  Filled 2019-02-05: qty 15

## 2019-02-05 MED ORDER — HALOPERIDOL LACTATE 5 MG/ML IJ SOLN: 0.5000 mg | INTRAMUSCULAR | Status: DC | PRN

## 2019-02-05 MED ORDER — ACETAMINOPHEN 650 MG RE SUPP: 650.0000 mg | Freq: Four times a day (QID) | RECTAL | Status: DC | PRN

## 2019-02-05 MED ORDER — ONDANSETRON 4 MG PO TBDP
4.0000 mg | ORAL_TABLET | Freq: Four times a day (QID) | ORAL | Status: DC | PRN
  Filled 2019-02-05: qty 1

## 2019-02-05 MED ORDER — GLYCOPYRROLATE 0.2 MG/ML IJ SOLN
0.4000 mg | Freq: Three times a day (TID) | INTRAMUSCULAR | Status: DC
  Administered 2019-02-05: 0.4 mg via INTRAVENOUS
  Filled 2019-02-05: qty 2

## 2019-02-05 MED ORDER — GLYCOPYRROLATE 0.2 MG/ML IJ SOLN: 0.2000 mg | INTRAMUSCULAR | Status: DC | PRN

## 2019-02-05 MED ORDER — LORAZEPAM 2 MG/ML IJ SOLN
1.0000 mg | Freq: Three times a day (TID) | INTRAMUSCULAR | Status: DC
  Administered 2019-02-05: 1 mg via INTRAVENOUS
  Filled 2019-02-05: qty 1

## 2019-02-05 MED ORDER — ONDANSETRON HCL 4 MG/2ML IJ SOLN: 4.0000 mg | Freq: Four times a day (QID) | INTRAMUSCULAR | Status: DC | PRN

## 2019-02-05 MED ORDER — ACETAMINOPHEN 325 MG PO TABS: 650.0000 mg | ORAL_TABLET | Freq: Four times a day (QID) | ORAL | Status: DC | PRN

## 2019-02-05 MED ORDER — DIPHENHYDRAMINE HCL 50 MG/ML IJ SOLN: 25.0000 mg | INTRAMUSCULAR | Status: DC | PRN

## 2019-02-05 MED ORDER — MORPHINE BOLUS VIA INFUSION
4.0000 mg | INTRAVENOUS | Status: DC | PRN
  Administered 2019-02-05 (×8): 4 mg via INTRAVENOUS
  Filled 2019-02-05: qty 4

## 2019-02-05 MED ORDER — HALOPERIDOL LACTATE 2 MG/ML PO CONC
0.5000 mg | ORAL | Status: DC | PRN
  Filled 2019-02-05: qty 0.3

## 2019-02-05 MED ORDER — GLYCOPYRROLATE 1 MG PO TABS: 1.0000 mg | ORAL_TABLET | ORAL | Status: DC | PRN

## 2019-02-05 MED ORDER — HALOPERIDOL 0.5 MG PO TABS
0.5000 mg | ORAL_TABLET | ORAL | Status: DC | PRN
  Filled 2019-02-05: qty 1

## 2019-02-05 MED ORDER — BIOTENE DRY MOUTH MT LIQD: 15.0000 mL | OROMUCOSAL | Status: DC | PRN

## 2019-02-05 MED ORDER — POLYVINYL ALCOHOL 1.4 % OP SOLN: 1.0000 [drp] | Freq: Four times a day (QID) | OPHTHALMIC | Status: DC | PRN

## 2019-02-05 MED ORDER — MORPHINE 100MG IN NS 100ML (1MG/ML) PREMIX INFUSION
4.0000 mg/h | INTRAVENOUS | Status: DC
  Administered 2019-02-05: 4 mg/h via INTRAVENOUS
  Filled 2019-02-05: qty 100

## 2019-02-21 NOTE — Progress Notes (Signed)
NAME:  Angela Page, MRN:  [id number], DOB:  [birth year], LOS: 24 ADMISSION DATE:  12/26/2018, CONSULTATION DATE:  01/18/19 REFERRING MD:  Neysa Bonito, CHIEF COMPLAINT:  Dyspnea   Brief History   49 yo female with hx of quadriplegia from incomplete C5-C7 fracture presented with osteomyelitis at multiple locations in setting of pressure ulcers.  Found to be COVID 19 positive.  Past Medical History  Asthma, Cocaine abuse, Depression, DM, GERD, Nephrolithiasis, Chronic pain,   Significant Hospital Events   12/23 admit 12/27 palliative care consjult 12/29 limited code; transfer to ICU 12/30 requiring pressors 12/31 altered mental status 01/02 off pressors. On vent. 01/03 Made DNR after palliative care discussion.  01/09 back on pressors 1/10 - more goals of care with mom - "not ready to give up". Had oxygen desaturation when disconnected from vent.  Remains on pressors. 1/11 - No events overnight. Tolerating T-piece. Continues to require pressors 1/12 - Became very lethargic overnight and had to be placed back on full vent support. Episodic hypotension responded to IVF and Neo 1/13- Was unable to wean beyond PS then around 10 AM on 1/12 failed weaning again and had to be placed back on full support. Continues to require Neo for BP support overnight Frequent episodes of pain during dressing change 1/14 - routine phlebtomies stoped and terminal care process initiated. 1/15 - Hypotensive this AM. No events overnight. Moved from 45M ICU to 65M ICU   Consults:  ID, urology, wound care  Procedures:  Rt PICC 12/24 >>   Significant Diagnostic Tests:  CT abd/pelvis 12/23 >> hyper-enhancement of GB, gastric wall thickening, suprapubic catheter through posterior bladder wall, large area of ulceration over Lt greater trochanter with progression of osteomyelitis, progression of ulceration over Rt ischial tuberosity with osteomyelitis, focal sacral decubitus ulcer with osteomyelitis Abd u/s 12/26  >> mild GB thickening CT Chest 12/28 >> pulmonary edema, patchy GGO, atherosclerosis Doppler arms and legs b/l 12/30 >> no DVT NM hepatobiliary scan 12/31 >> no evidence of cystic duct obstruction or acute cholecystitis  Micro Data:  SARS CoV2 PCR 12/23 >> positive Urine 12/23 >> Proteus miribalis Blood 12/23 >> negative  COVID Therapy:  Remdesivir 12/23 - 12/27  Antimicrobials:  Vancomycin 12/23 >> 12/29 Cefepime 12/23 >> 1/04 Doxycycline 12/27 >> 12/29 Linezolid 12/31 >> 1/05  Interim history/subjective:   03/04/19 -she is now in ICU to M.  She is full comfort care and terminal care.  Palliative care saw the patient today and felt patient might pass away today sometime.  Systolic blood pressure in the 40s earlier but currently map is 45 with a systolic blood pressure of 56.  Objective   Blood pressure (!) 51/41, pulse 80, temperature 98.2 F (36.8 C), temperature source Oral, resp. rate 14, height 5\' 2"  (1.575 m), weight 57.8 kg, last menstrual period 05/27/2016, SpO2 96 %.    Vent Mode: PRVC FiO2 (%):  [40 %] 40 % Set Rate:  [16 bmp] 16 bmp Vt Set:  [400 mL-450 mL] 450 mL PEEP:  [5 cmH20] 5 cmH20 Plateau Pressure:  [23 cmH20-28 cmH20] 24 cmH20   Intake/Output Summary (Last 24 hours) at Mar 04, 2019 1202 Last data filed at Mar 04, 2019 0800 Gross per 24 hour  Intake 2220 ml  Output 215 ml  Net 2005 ml   Filed Weights   01/30/19 0500 02/01/19 0500 02/02/19 0359  Weight: 56.9 kg 57.6 kg 57.8 kg    Moribund looking female with contractures.  Facial muscles are weak and followed.  She  is unresponsive.  She is contracted.  She is on ventilator through tracheostomy.  Pulse ox is 100% heart rate of 81.    Resolved Hospital Problem list   Sepsis  Assessment & Plan:   Acute on chronic hypoxic/hypercapnic respiratory failure from COVID 19 pneumonia and aspiration pneumonia. Tracheostomy status.   Anemia of critical illness and chronic disease.   Chronic  hypotension.   Severe multi-location osteomyelitis from pressure wounds. - Present prior to admission and non-healing - Doubt will ever heal  Cachexia.   AKI from contrast dye and vancomycin. High BUN   Terminal care/palliative care/comfort care  Plan   She is on a low-dose morphine drip.  She appears comfortable.  We will continue terminal care and supportive measures.  Death is anticipated in the next few to several days.  But most likely today.   ATTESTATION & SIGNATURE   The patient Angela Page is critically ill with multiple organ systems failure and requires high complexity decision making for assessment and support, frequent evaluation and titration of therapies, application of advanced monitoring technologies and extensive interpretation of multiple databases.   Critical Care Time devoted to patient care services described in this note is  31  Minutes. This time reflects time of care of this signee Dr Brand Males. This critical care time does not reflect procedure time, or teaching time or supervisory time of PA/NP/Med student/Med Resident etc but could involve care discussion time     Dr. Brand Males, M.D., [facility].C.P Pulmonary and Critical Care Medicine Staff Physician [place] Pulmonary and Critical Care Pager: [phone], If no answer or between  15:00h - 7:00h: call [phone]  02/16/19 12:02 PM    LABS    PULMONARY No results for input(s): PHART, PCO2ART, PO2ART, HCO3, TCO2, O2SAT in the last 168 hours.  Invalid input(s): PCO2, PO2  CBC Recent Labs  Lab 01/31/19 0314 02/01/19 0314 02/02/19 0333  HGB 9.0* 7.4* 7.8*  HCT 28.0* 24.8* 26.0*  WBC 13.7* 11.9* 14.6*  PLT 162 146* 212    COAGULATION No results for input(s): INR in the last 168 hours.  CARDIAC  No results for input(s): TROPONINI in the last 168 hours. No results for input(s): PROBNP in the last 168 hours.   CHEMISTRY Recent Labs  Lab  01/30/19 0358 01/30/19 0358 01/31/19 0314 01/31/19 0314 02/01/19 0314 02/02/19 0333  NA 144  --  149*  --  152* 150*  K 3.5   < > 3.5   < > 4.4 4.1  CL 112*  --  113*  --  120* 115*  CO2 22  --  24  --  23 25  GLUCOSE 137*  --  176*  --  154* 167*  BUN 159*  --  148*  --  136* 137*  CREATININE 1.49*  --  1.23*  --  0.98 1.00  CALCIUM 7.2*  --  7.5*  --  7.1* 7.4*  MG  --   --   --   --  1.4* 2.2  PHOS  --   --   --   --  2.0* 2.1*   < > = values in this interval not displayed.   Estimated Creatinine Clearance: 54.4 mL/min (by C-G formula based on SCr of 1 mg/dL).   LIVER No results for input(s): AST, ALT, ALKPHOS, BILITOT, PROT, ALBUMIN, INR in the last 168 hours.   INFECTIOUS No results for input(s): LATICACIDVEN, PROCALCITON in the  last 168 hours.   ENDOCRINE CBG (last 3)  Recent Labs    02/02/19 1219 02/26/2019 0808  GLUCAP 171* 294*         IMAGING x48h  - image(s) personally visualized  -   highlighted in bold No results found.

## 2019-02-21 NOTE — Discharge Summary (Signed)
DISCHARGE SUMMARY    Date of admit: 12/21/2018  4:55 PM Date of discharge: 2019/02/10  3:23 PM Length of Stay: 24 days  PCP is Default, Provider, MD  CAUSE(S) OF DEATH  COVID-19   PROBLEM LIST Active Problems:   Quadriplegia ([place])   Decubitus ulcer of sacral region, stage 4 ([place])   Anemia, iron deficiency   Tracheostomy status ([place])   PEG (percutaneous endoscopic gastrostomy) status ([place])   Complicated UTI (urinary tract infection)   Sepsis ([place])   Hyperkalemia   COVID-19   Osteomyelitis of left fibula ([place])   Palliative care encounter   DNR (do not resuscitate)   Acute respiratory failure ([place])   Alkaline phosphatase elevation   Chronic osteomyelitis of pelvic region ([place])   Osteomyelitis ([place])   Advanced care planning/counseling discussion   Goals of care, counseling/discussion   Terminal care    SUMMARY Priscilla Soifer was 49 y.o. patient with    has a past medical history of Acute on chronic respiratory failure with hypoxia ([place]), Anasarca (06/10/2016), Asthma, At high risk for severe sepsis, Cocaine use (10/08/2013), Decubitus ulcer of buttock, unstageable ([place]), Depression, Diabetes mellitus without complication ([place]), GERD (gastroesophageal reflux disease), HCAP (healthcare-associated pneumonia) (06/09/2016), Hepatitis, Kidney stone, Lobar pneumonia ([place]), Overdose of opiate or related narcotic ([place]) (09/02/2014), Pacemaker, Pleurisy, Polysubstance dependence including opioid type drug, episodic abuse ([place]) (10/27/2013), Protein calorie malnutrition ([place]) (10/31/2015), Quadriparesis ([place]), Quadriplegia and quadriparesis ([place]), Quadriplegia, C5-C7 incomplete ([place]) (10/08/2013), Stage IV pressure ulcer of sacral region ([place]) (10/31/2015), and Tracheostomy status ([place]).   has a past surgical history that includes Appendectomy; Cardiac surgery; I & D extremity (10/28/2011); ir generic historical (11/23/2015); Back surgery (10/08/2013); Tracheostomy; Gastrostomy  (11/23/2015); Insertion of suprapubic catheter (N/A, 07/28/2016); Cystoscopy w/ ureteral stent placement (Right, 03/09/2017); Cystoscopy/ureteroscopy/holmium laser/stent placement (Right, 07/16/2017); Cystoscopy w/ ureteral stent removal (Right, 07/16/2017); Lithotripsy (08/2017); IR PATIENT EVAL TECH 0-60 MINS (12/18/2017); and IR REPLACE G-TUBE SIMPLE WO FLUORO (12/19/2017).   Admitted on 12/21/2018 with  49 yo female with hx of quadriplegia from incomplete C5-C7 fracture presented with osteomyelitis at multiple locations in setting of pressure ulcers.  Found to be COVID 19 positive   [facility] Events   12/23 admit - CT abd/pelvis 12/23 >> hyper-enhancement of GB, gastric wall thickening, suprapubic catheter through posterior bladder wall, large area of ulceration over Lt greater trochanter with progression of osteomyelitis, progression of ulceration over Rt ischial tuberosity with osteomyelitis, focal sacral decubitus ulcer with osteomyelitis   SARS CoV2 PCR 12/23 >> positive   Urine 12/23 >> Proteus miribalis  12/27 palliative care consjult - CT Chest 12/28 >> pulmonary edema, patchy GGO, atherosclerosis Doppler arms and legs b/l 12/30 >> no DVT 12/29 limited code; transfer to ICU 12/30 requiring pressors 12/31 altered mental status - NM hepatobiliary scan 12/31 >> no evidence of cystic duct obstruction or acute cholecystitis 01/02 off pressors. On vent. 01/03 Made DNR after palliative care discussion.  01/09 back on pressors 1/10 - more goals of care with mom - "not ready to give up". Had oxygen desaturation when disconnected from vent. Remains on pressors. 1/11 - No events overnight. Tolerating T-piece. Continues to require pressors 1/12 - Became very lethargic overnight and had to be placed back on full vent support. Episodic hypotension responded to IVF and Neo 1/13- Was unable to wean beyond PS then around 10 AM on 1/12 failed weaning again and had to be placed back on full  support. Continues to require Neo for BP support overnight Frequent  episodes of pain during dressing change 1/14 - routine phlebtomies stoped and terminal care process initiated. 1/15 - Hypotensive this AM. No events overnight. Moved from 58M ICU to 73M ICU    02/16/19 -she is now in ICU to M.  She is full comfort care and terminal care.  Palliative care saw the patient today and felt patient might pass away today sometime.  Systolic blood pressure in the 40s earlier but currently map is 45 with a systolic blood pressure of 56    Later in day passed away comfortably          SIGNED Dr. Brand Males, M.D., [facility].C.P Pulmonary and Critical Care Medicine Staff Physician [place] Pulmonary and Critical Care Pager: [phone], If no answer or between  15:00h - 7:00h: call [phone]  02/11/2019 3:33 PM

## 2019-02-21 NOTE — Progress Notes (Signed)
Palliative follow up.  Received call from bedside RN.  Patient's systolic BP is in the [id number].  Arrived at bedside to find RN compassionately stroking patient's head.  Her SBP is now in the 50s.  Patient can not follow commands to blink.  She does not speak to me.  Urine output has decreased and is dark red in color.  Patient appears to be actively dying.  I contacted Angela Page (Mother) and explained what we were witnessing.  I told her I was concerned that Angela Page might pass away sometime today and asked her if she wanted to be here.   Angela Page was very sad.  She is emotionally and physically exhausted.  She stated that she was planning to visit again tomorrow with Angela Page's brother.   She asked that if Angela Page dies today to please call her and let her know.      I asked Angela Page what else we could do for Angela Page and the family.  She asked that we pray for the family.  Patient lying in bed.  Eyes partially open, brow furrowed. CV distant but regular resp no distress Patient does not respond to my voice or touch.   Assessment:  Patient actively dying  Recommendations:     Will continue to treat uncomfortable symptoms.  Angela Page has been receiving boluses of morphine and ativan.  She would benefit from a continuous infusion of pain relief so I will order a morphine gtt and scheduled ativan.   Would withdraw the vent after morphine gtt has been in place for a few hours.   Please call family with an update when she passes.   If she passes after 8:00 pm this evening the family respectfully asks that we not call them until tomorrow morning.  PMT will continue to follow with you  Florentina Jenny, PA-C Palliative Medicine Office:  [phone]  35 min.

## 2019-02-21 NOTE — Progress Notes (Signed)
Patient time of death at 53. Postmortem care and checklist completed. Mother notified by palliative care provider.

## 2019-02-21 NOTE — Progress Notes (Signed)
Patient's urine has changed from amber to bloody. Urine appears thicker and rust/blood colored. No obvious sign of trauma or pulling on catheter.   Milford Cage, RN

## 2019-02-21 NOTE — Progress Notes (Signed)
Took patient off of vent per MD's order after sedation have been started.

## 2019-02-21 DEATH — deceased

## 2019-04-13 IMAGING — CT CT ABD-PELV W/ CM
2 of 5 series · 16 of 46 positions shown, 18 images · IV contrast (ISOVUE)
Comparison: 11/12/2015

CLINICAL DATA: Abdominal pain

EXAM:
CT ABDOMEN AND PELVIS WITH CONTRAST
TECHNIQUE: Multidetector CT imaging of the abdomen and pelvis was performed
using the standard protocol following bolus administration of
intravenous contrast.
CONTRAST:  <See Chart> [id number] IOPAMIDOL (J41LIQ-XPP) INJECTION
61%

[Series 2: axial st · axial · 0.86mm/px · z∈[-440,-50]mm · 13 of 92 slices shown, 15 images]
[im 7/92  soft-tissue]
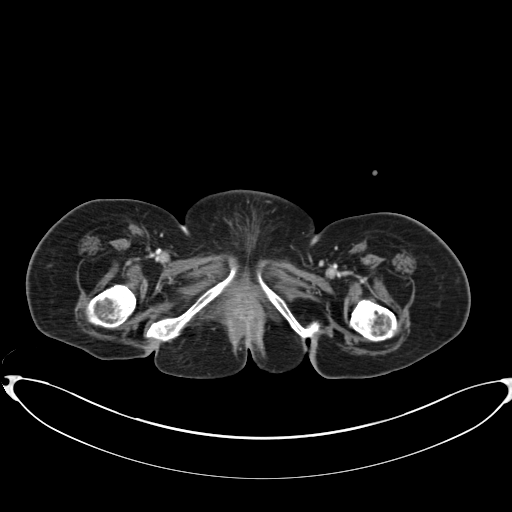
[im 7/92  bone]
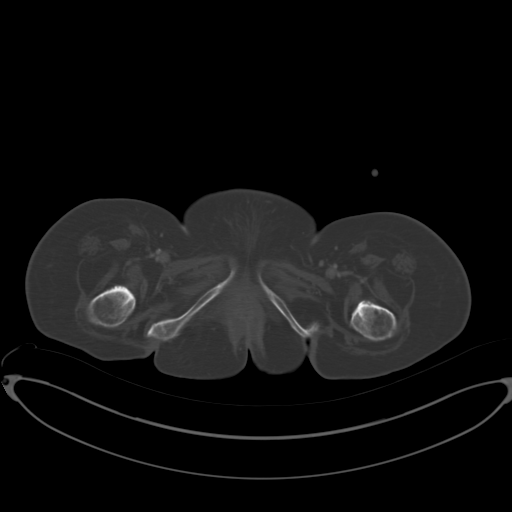
[im 14/92  soft-tissue]
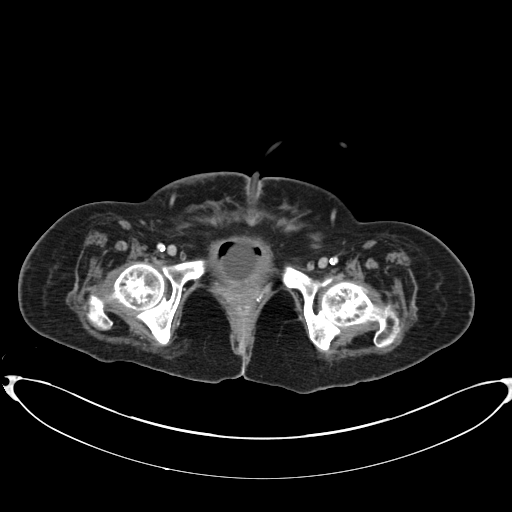
[im 20/92  soft-tissue]
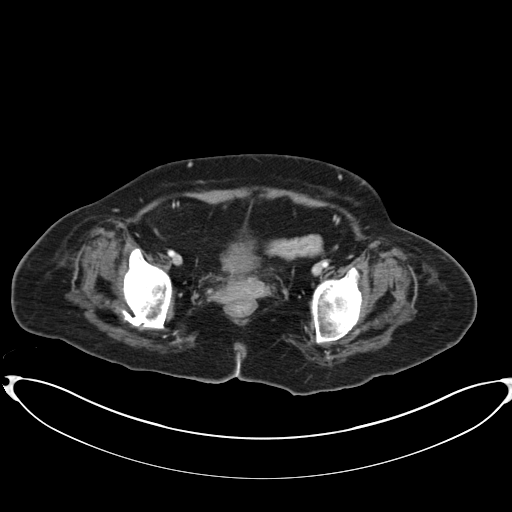
[im 27/92  soft-tissue]
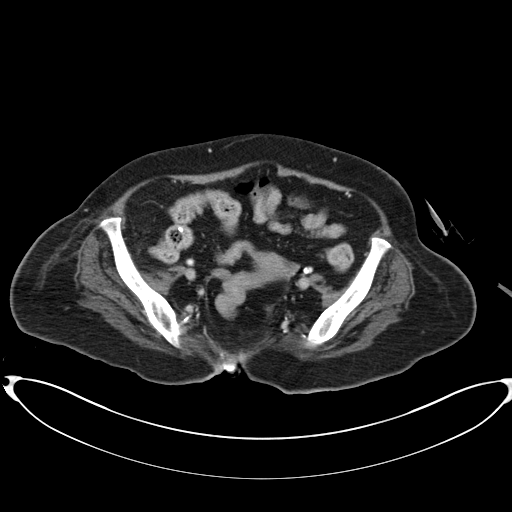
[im 33/92  soft-tissue]
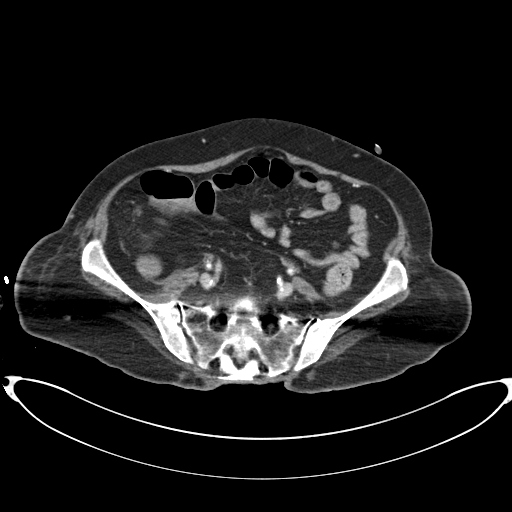
[im 40/92  soft-tissue]
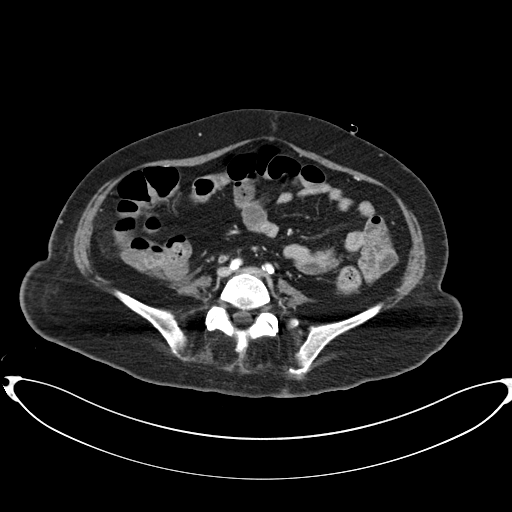
[im 46/92  soft-tissue]
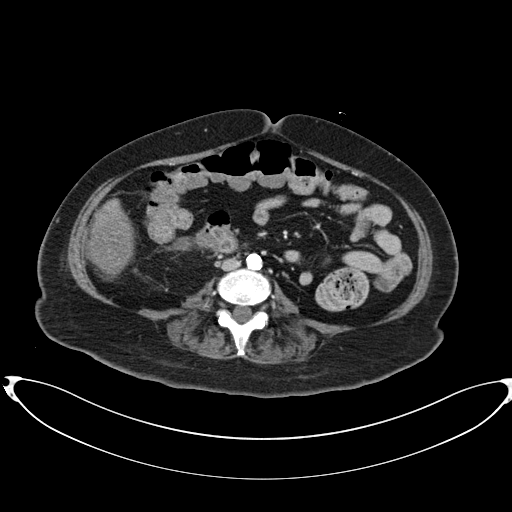
[im 53/92  soft-tissue]
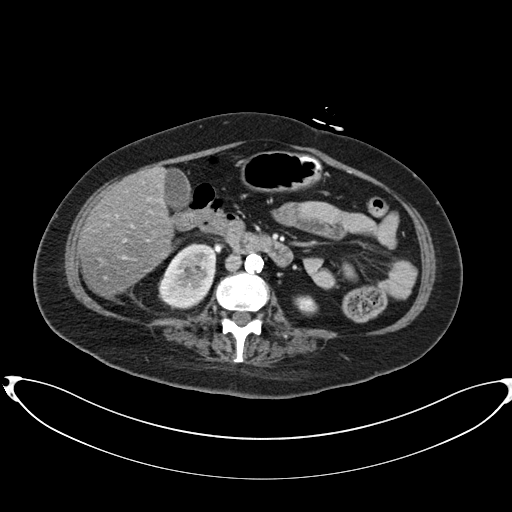
[im 59/92  soft-tissue]
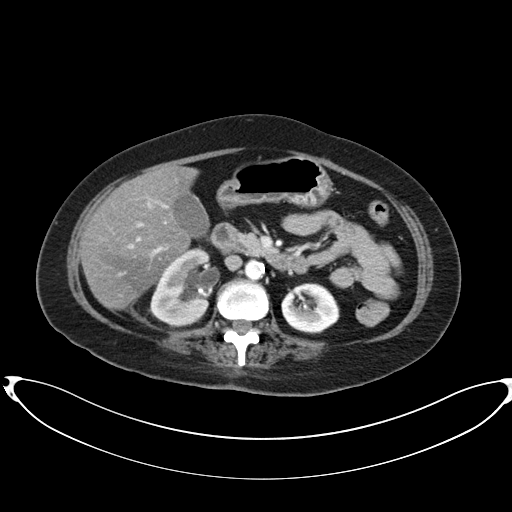
[im 59/92  bone]
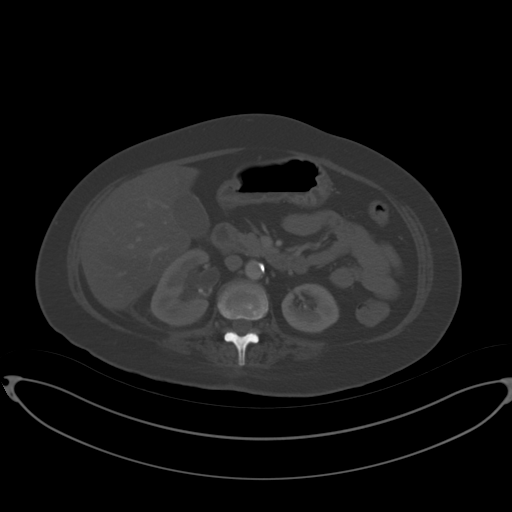
[im 66/92  soft-tissue]
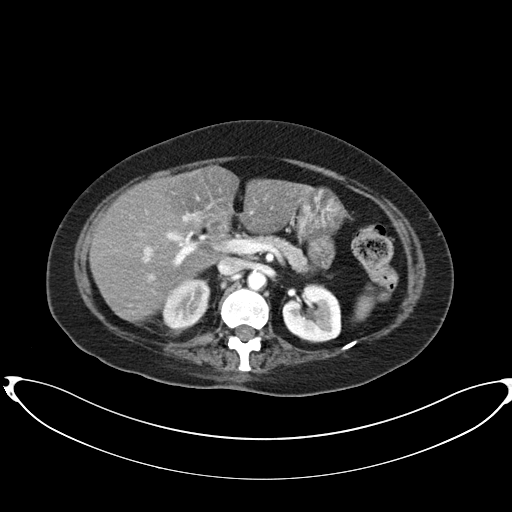
[im 72/92  soft-tissue]
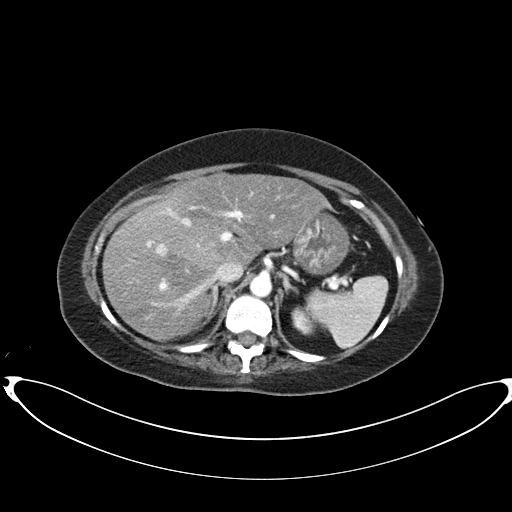
[im 79/92  soft-tissue]
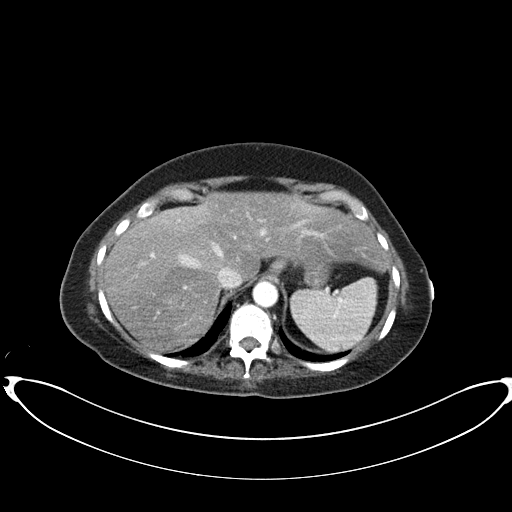
[im 85/92  soft-tissue]
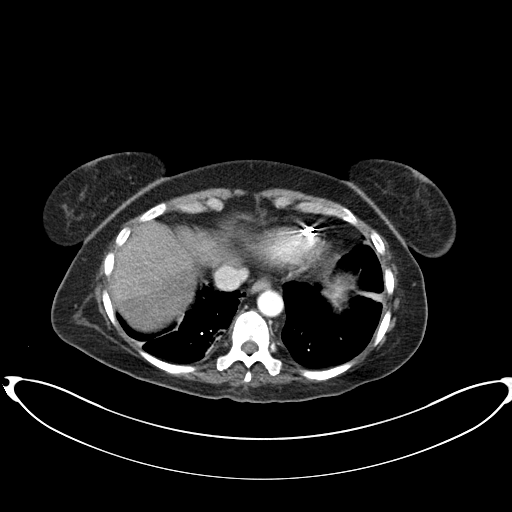

[Series 6: coronal st · coronal · 0.82mm/px · 3 of 91 slices shown]
[im 31/91  soft-tissue]
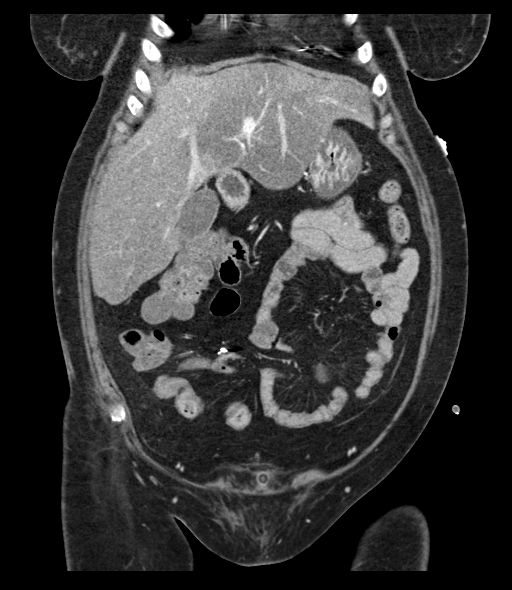
[im 41/91  soft-tissue]
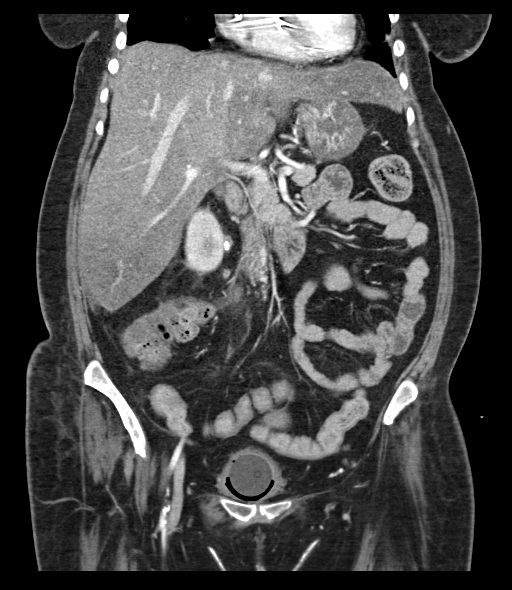
[im 51/91  soft-tissue]
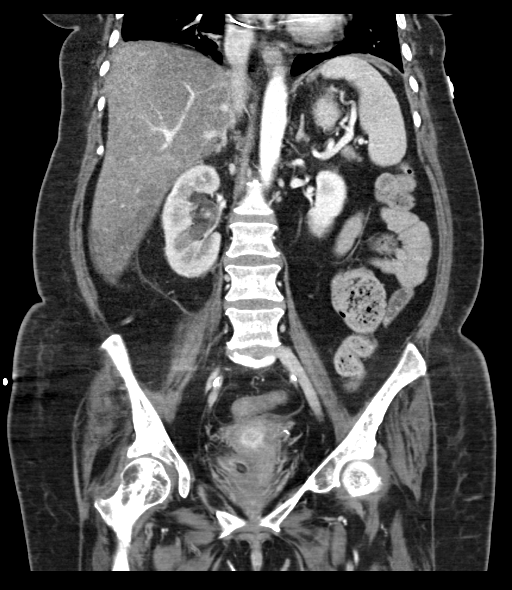

[16 of 46 positions shown; findings below may reference images not displayed]

FINDINGS: Lower chest: Consolidation in the right lower lobe concerning for
pneumonia. No effusions. Linear areas of atelectasis or scarring in
the left lung base.

Hepatobiliary: Geographic fatty infiltration of the liver. No focal
abnormality. Gallbladder unremarkable.

Pancreas: No focal abnormality or ductal dilatation.

Spleen: No focal abnormality.  Normal size.

Adrenals/Urinary Tract: 9 mm proximal right ureteral stone with mild
hydronephrosis. Bilateral nonobstructing renal stones. No
hydronephrosis on the left. No adrenal mass. Foley catheter present
in the bladder which is decompressed.

Stomach/Bowel: Gastrostomy tube in the stomach. No evidence of bowel
obstruction.

Vascular/Lymphatic: Diffuse aortic and iliac calcifications. No
evidence of aneurysm or adenopathy.

Reproductive: Uterus and adnexa unremarkable.  No mass.

Other: No free fluid or free air.

Musculoskeletal: No acute bony abnormality.
IMPRESSION: 9 mm proximal right ureteral stone with mild right hydronephrosis.

Bilateral nephrolithiasis.

Geographic fatty infiltration of the liver.

Aortic atherosclerosis.

Consolidation in the right lower lobe concerning for pneumonia.

## 2019-04-13 IMAGING — CR DG CHEST 2V
2 series · 2 of 2 positions shown · non-contrast
Comparison: 10/17/2016

CLINICAL DATA: Abdominal tightness

EXAM:
CHEST  2 VIEW

[w chest lat]
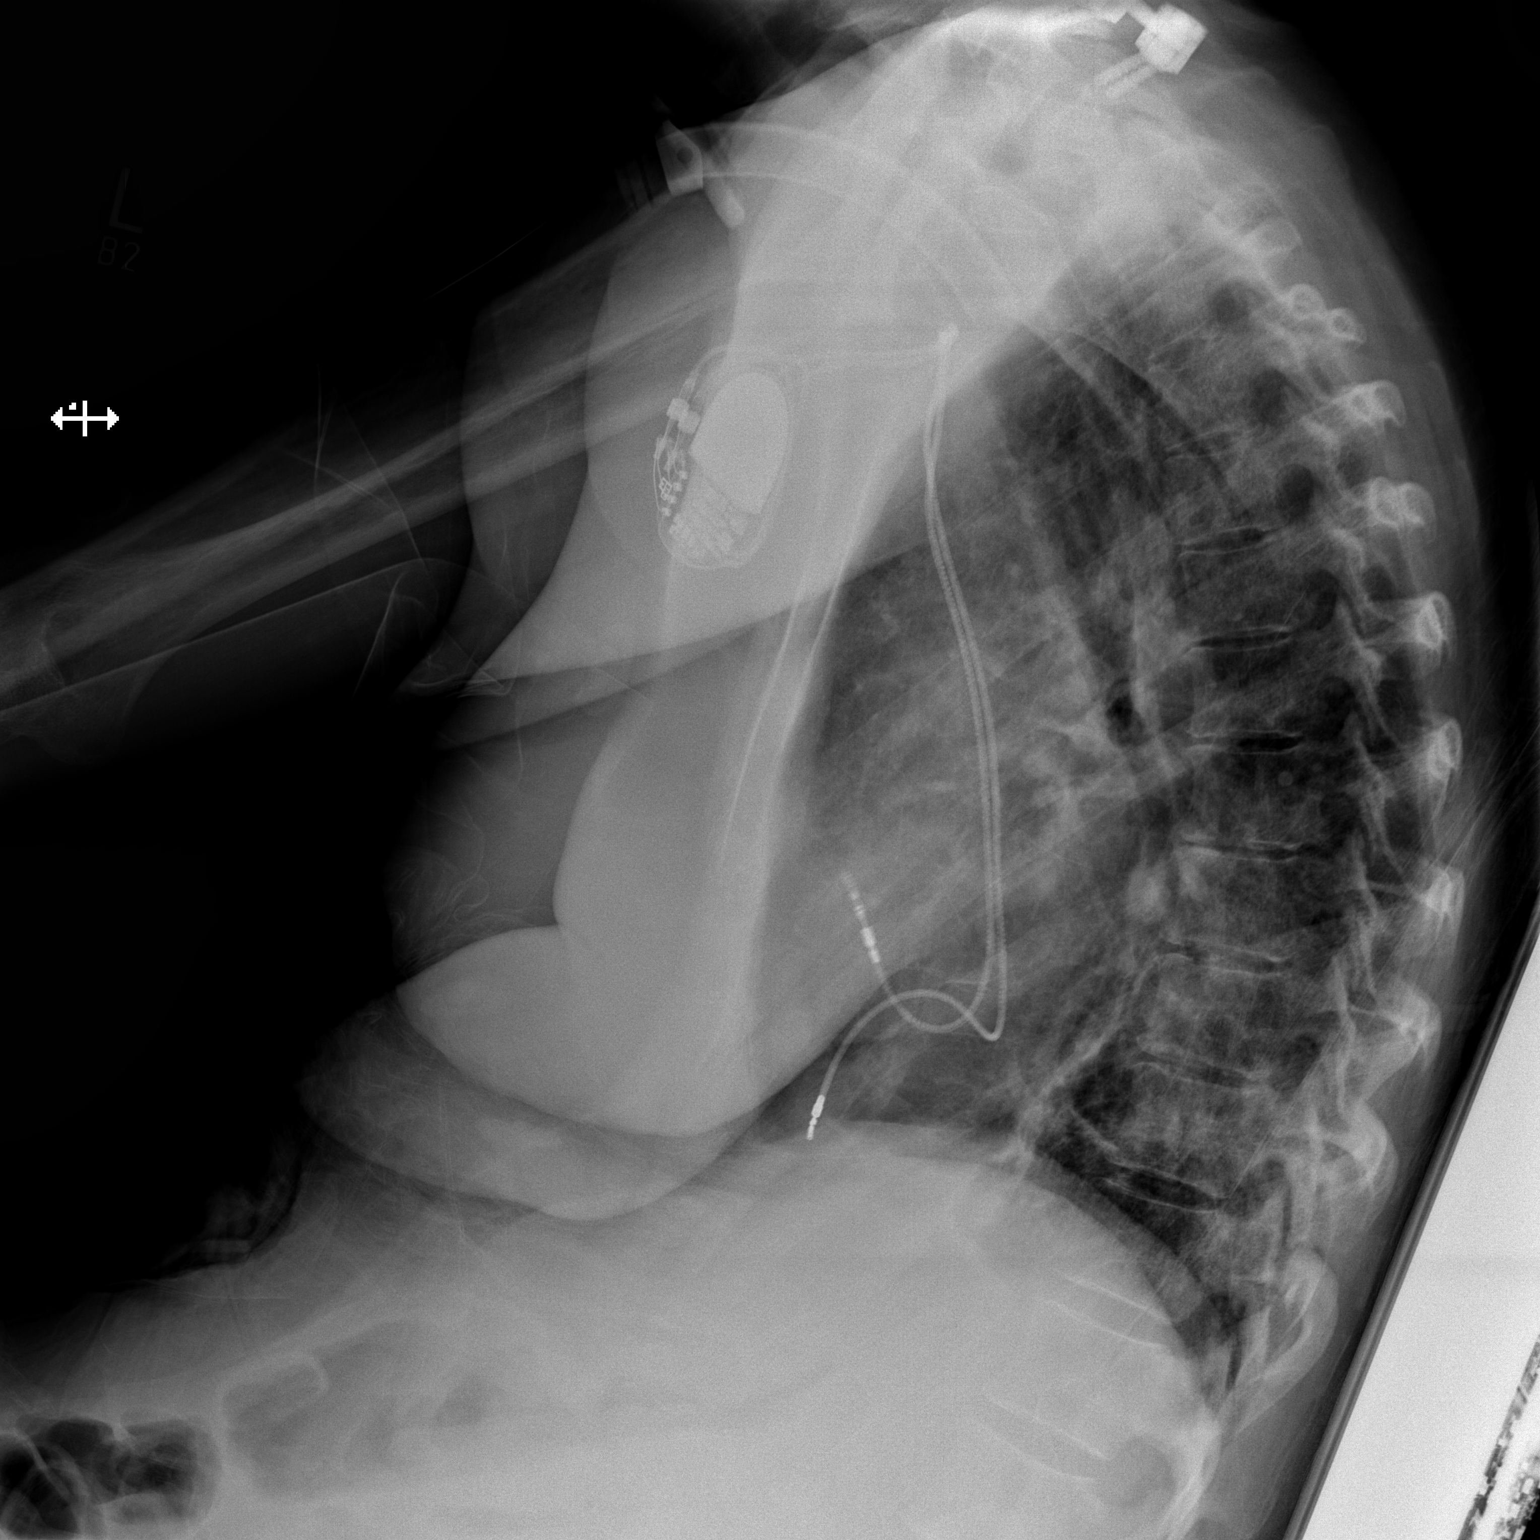

[x chest ap]
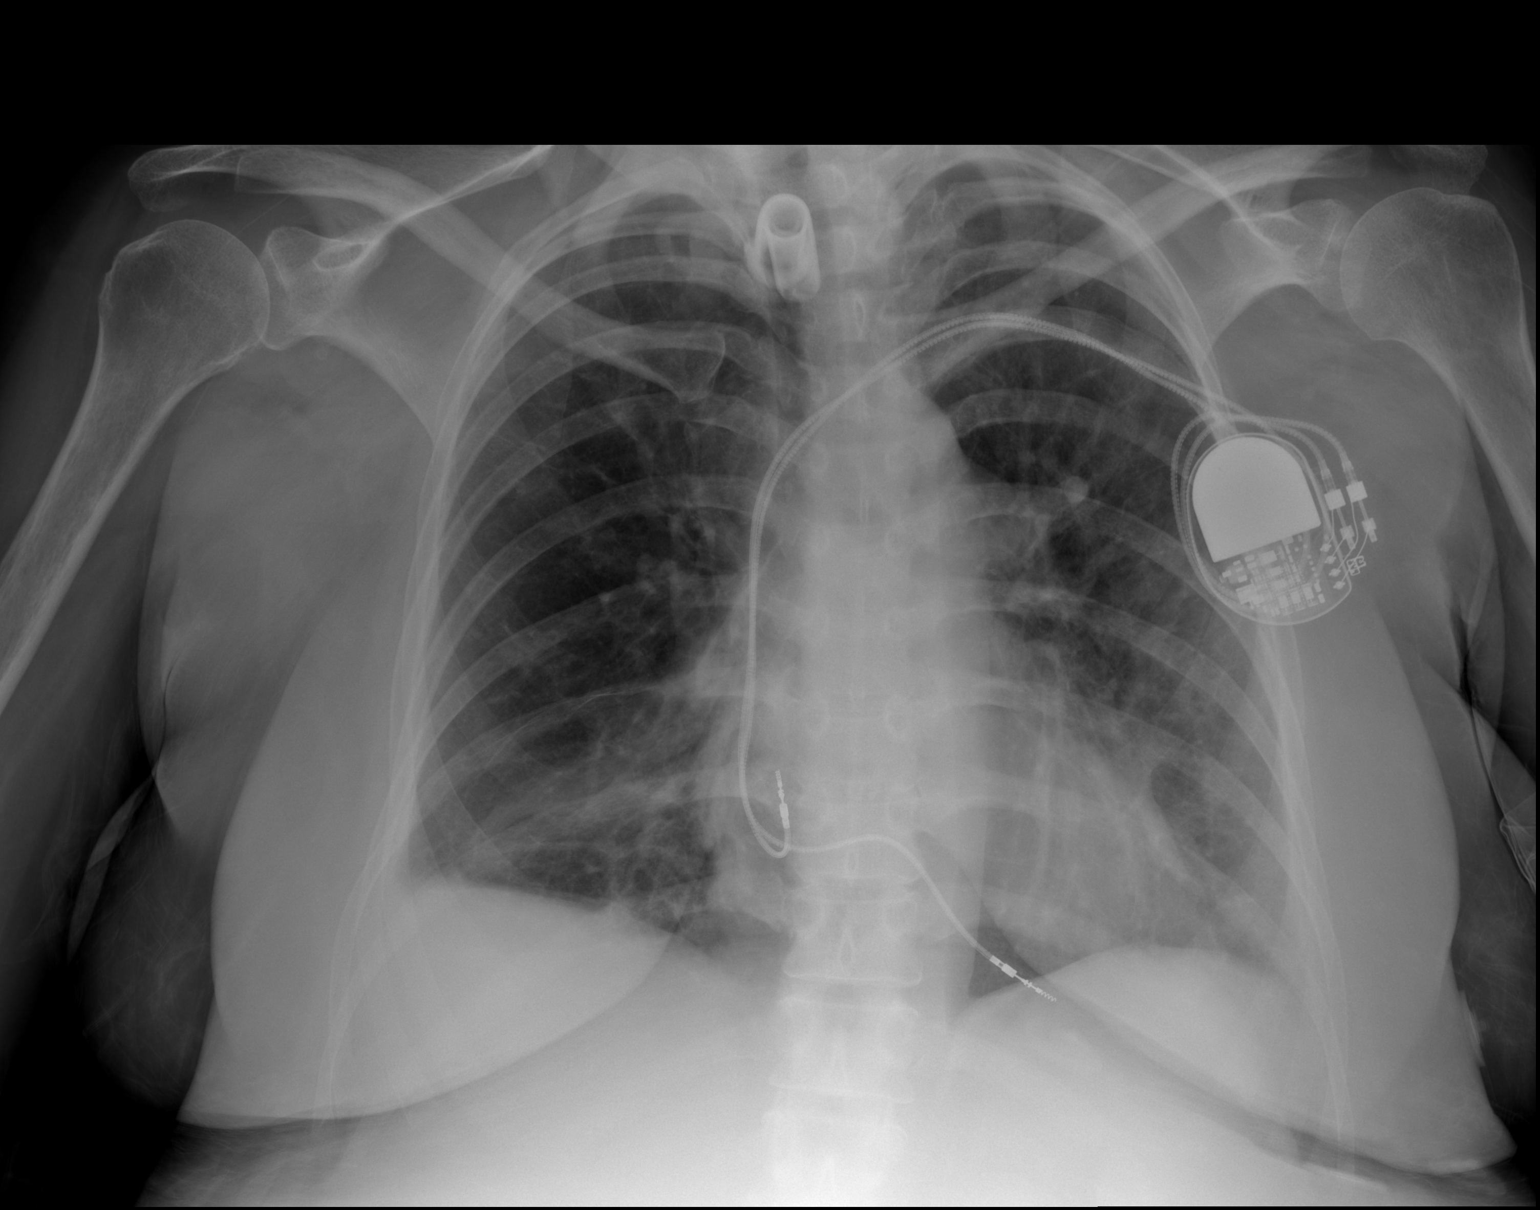

[2 of 2 positions shown; findings below may reference images not displayed]

FINDINGS: Tracheostomy in good position. Dual lead pacemaker unchanged.
Negative for heart failure. Mild bibasilar airspace disease
unchanged. Some of this may be scarring or atelectasis. Small
pleural effusions bilaterally
IMPRESSION: Mild bibasilar airspace disease unchanged. Probable atelectasis or
scarring.

Small bilateral effusions.

## 2019-09-14 IMAGING — RF DG SWALLOWING FUNCTION
7 of 8 series · 17 of 24 positions shown · non-contrast
Comparison: None.

CLINICAL DATA: Dysphagia.

EXAM:
MODIFIED BARIUM SWALLOW
TECHNIQUE: Different consistencies of barium were administered orally to the
patient by the Speech Pathologist. Imaging of the pharynx was
performed in the lateral projection.
FLUOROSCOPY TIME:  Fluoroscopy Time:  48 seconds
Radiation Exposure Index (if provided by the fluoroscopic device):
3.8 mGy

[Series 1: cp_standard · 0.34mm/px · 3 of 26 frames shown (1 of 7)]
[frame 4/26]
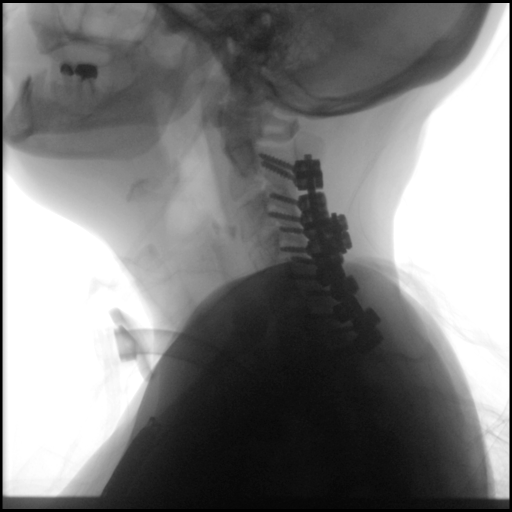
[frame 14/26]
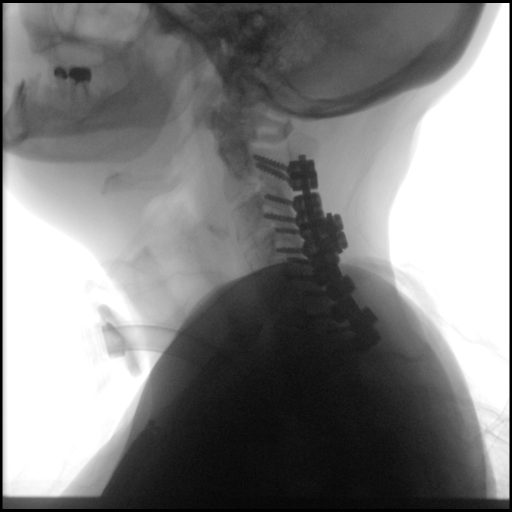
[frame 23/26]
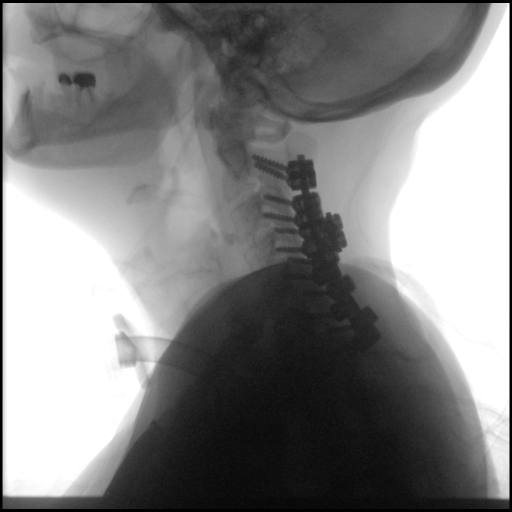

[Series 2: cp_standard · 0.35mm/px · 2 of 163 frames shown (2 of 7)]
[frame 25/163]
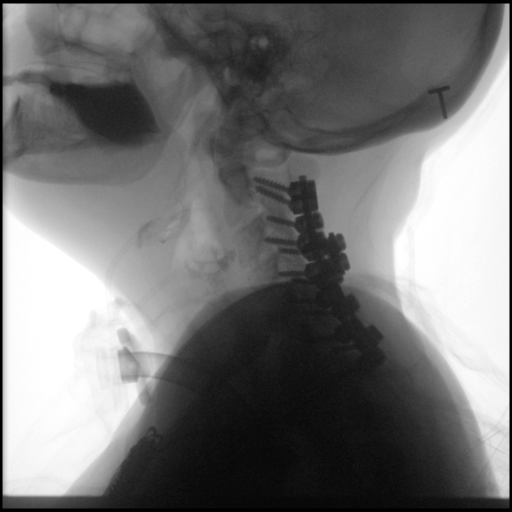
[frame 139/163]
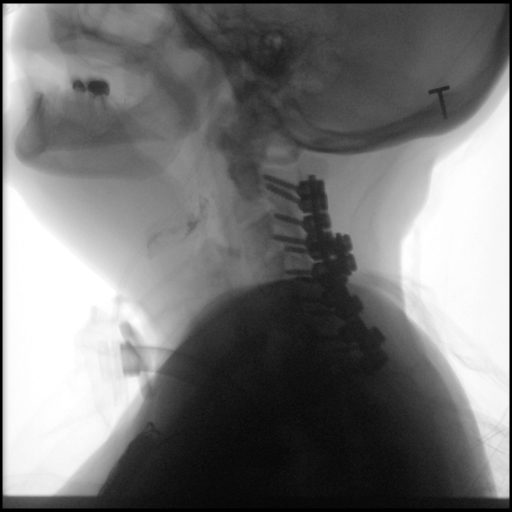

[Series 3: cp_standard · 0.35mm/px · 3 of 63 frames shown (3 of 7)]
[frame 2/63]
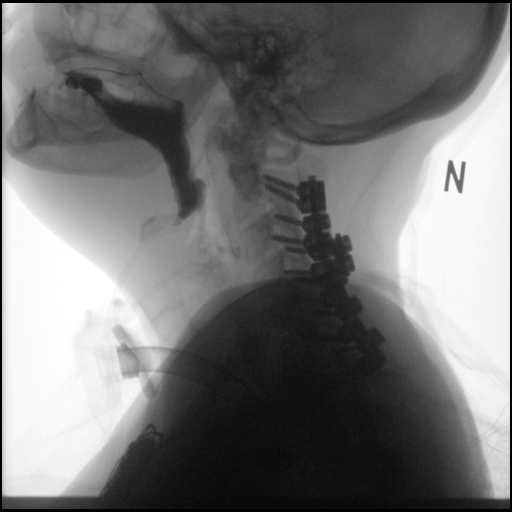
[frame 32/63]
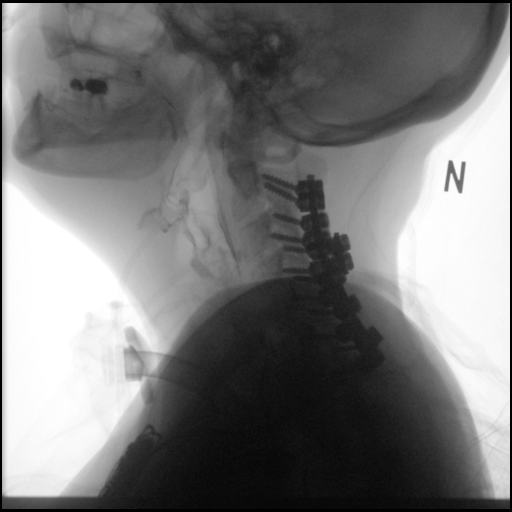
[frame 54/63]
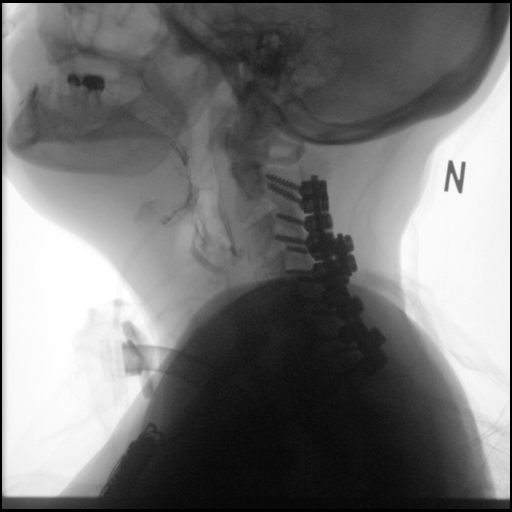

[Series 4: cp_standard · 0.35mm/px · 3 of 66 frames shown (4 of 7)]
[frame 31/66]
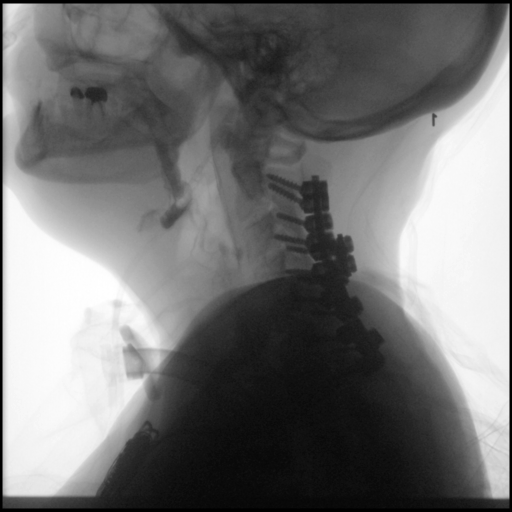
[frame 34/66]
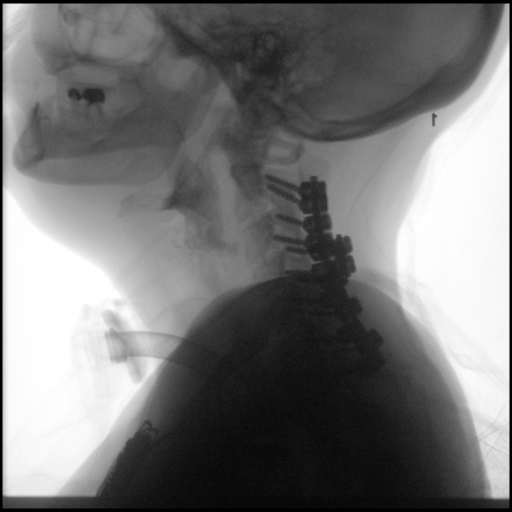
[frame 57/66]
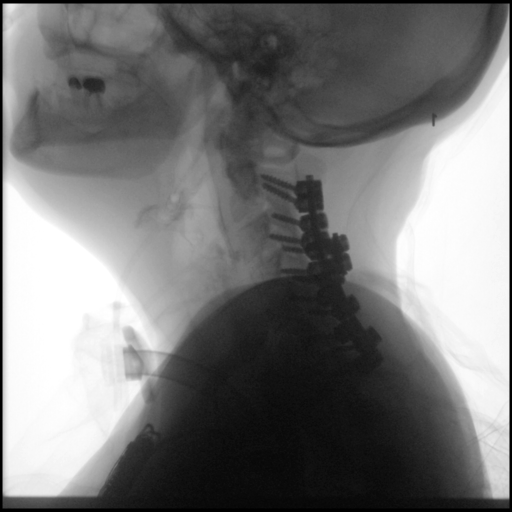

[Series 5: cp_standard · 0.35mm/px · 2 of 44 frames shown (5 of 7)]
[frame 23/44]
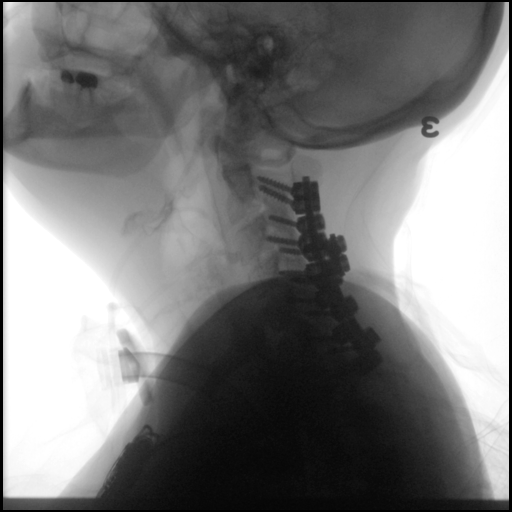
[frame 32/44]
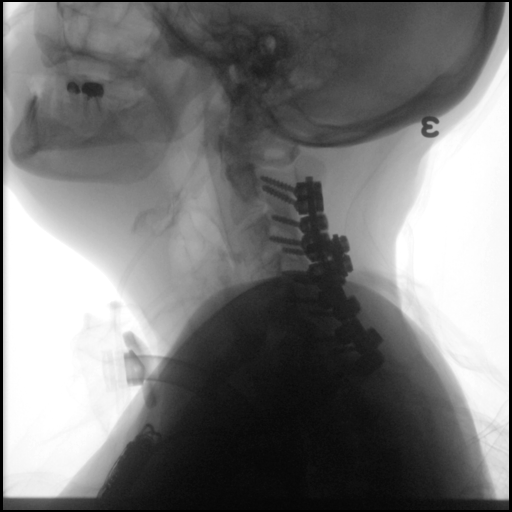

[Series 6: cp_standard · 0.35mm/px · 3 of 52 frames shown (6 of 7)]
[frame 27/52]
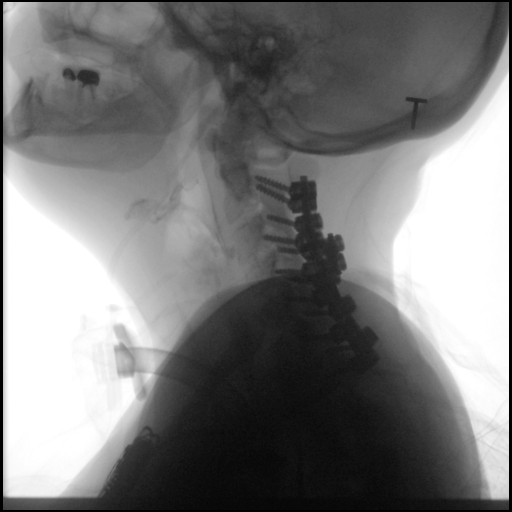
[frame 45/52]
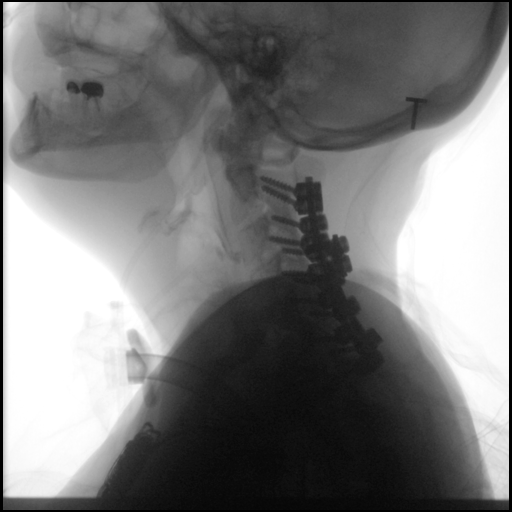
[frame 50/52]
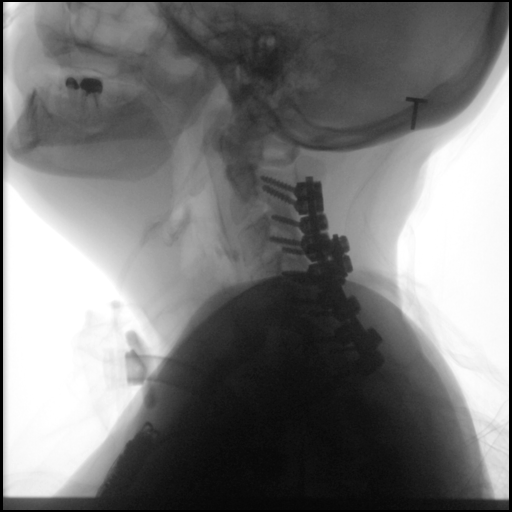

[Series 8: cp_standard · 0.17mm/px · 1 of 1 slices shown (7 of 7)]
[im 1/1]
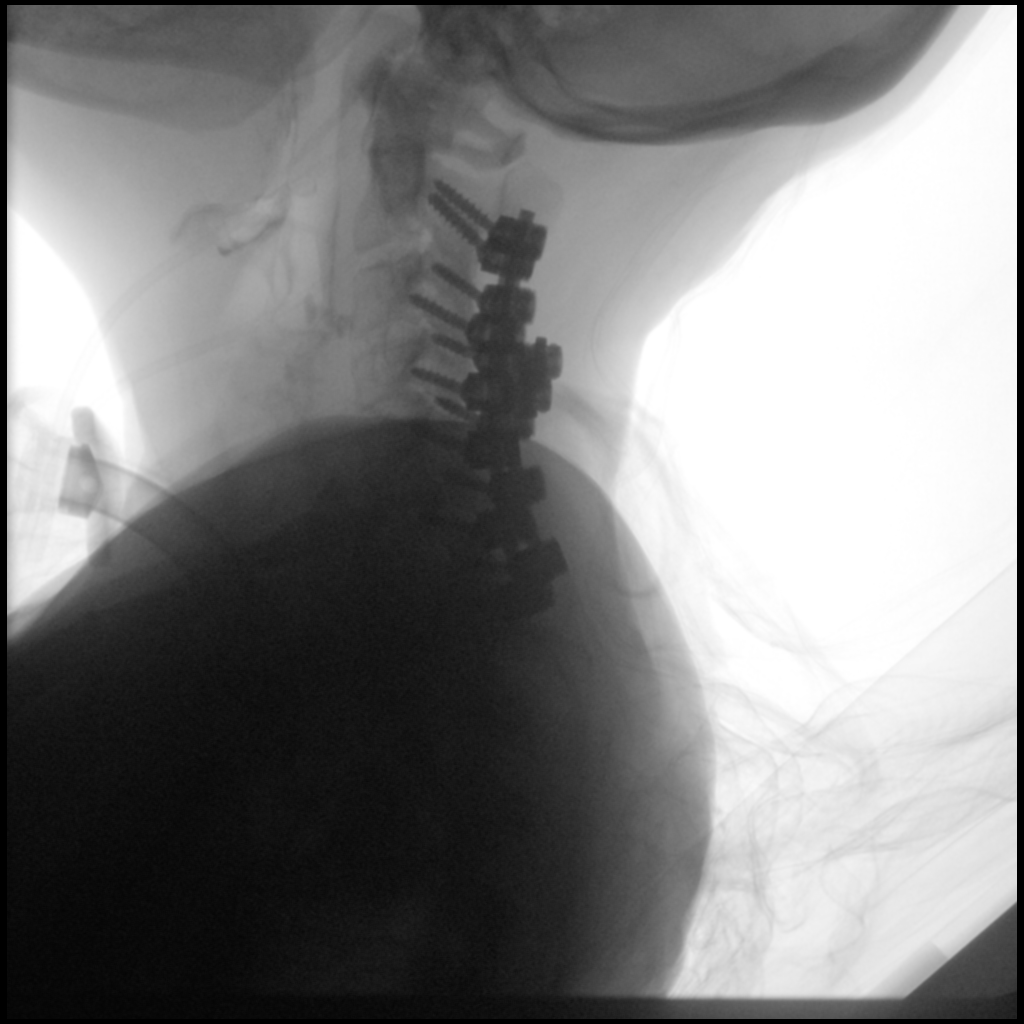

[17 of 24 positions shown; findings below may reference images not displayed]

FINDINGS: Thin liquid-Trace laryngeal penetration.  No aspiration.

Nectar thick liquid- within normal limits

Honey-not assessed.

Pure- within normal limits

Cracker-within normal limits

Pure with cracker- within normal limits

Barium tablet - within normal limits passing below the limits of
today's study (lower esophagus).
IMPRESSION: Essentially normal exam. Trace laryngeal penetration with 1 of the
oral boluses of thin liquid, but no aspiration. Remainder of the
oral boluses were well tolerated.

Please refer to the Speech Pathologists report for complete details
and recommendations.

## 2020-01-20 IMAGING — CT CT HEAD W/O CM
4 series · 16 of 47 positions shown, 18 images · non-contrast
Comparison: None.

CLINICAL DATA: Altered level of consciousness (LOC), unexplained

EXAM:
CT HEAD WITHOUT CONTRAST
TECHNIQUE: Contiguous axial images were obtained from the base of the skull
through the vertex without intravenous contrast.

[Series 3: head wo · axial · 0.41mm/px · z∈[-186,-66]mm · 7 of 32 slices shown, 9 images]
[im 4/32  brain]
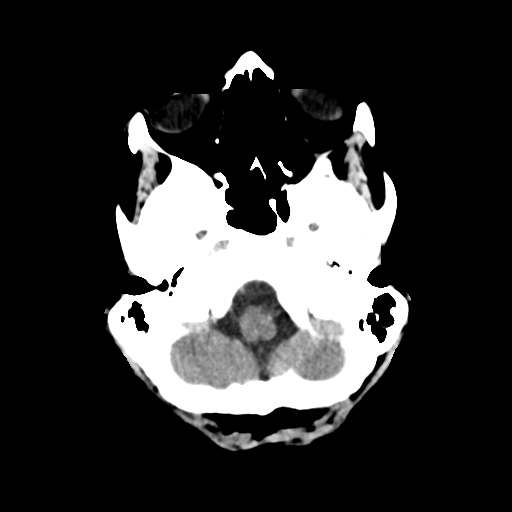
[im 4/32  bone]
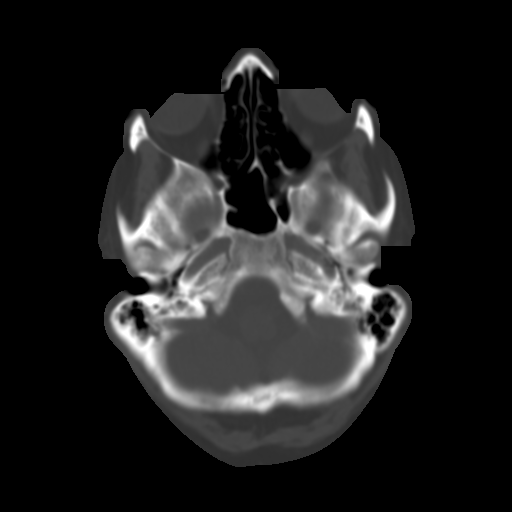
[im 8/32  brain]
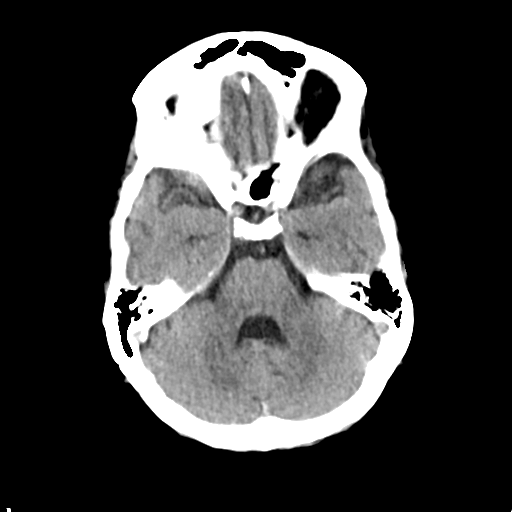
[im 12/32  brain]
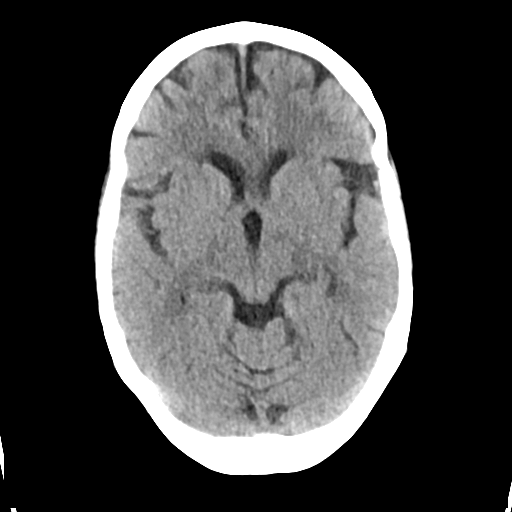
[im 16/32  brain]
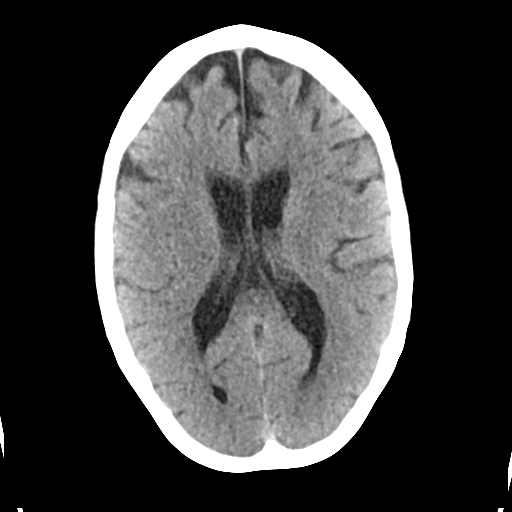
[im 20/32  brain]
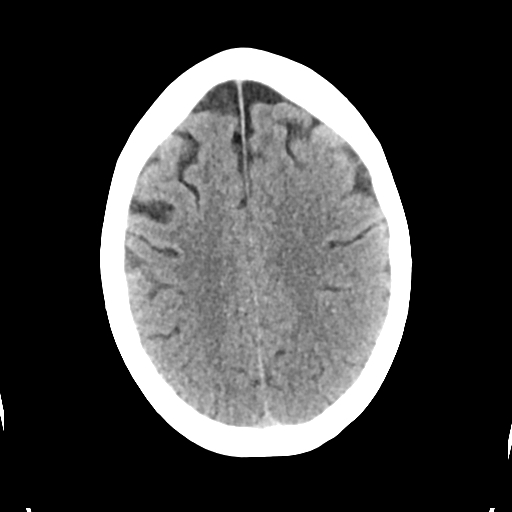
[im 20/32  bone]
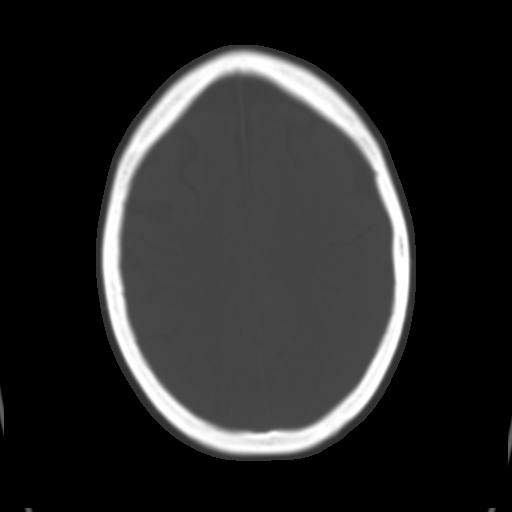
[im 24/32  brain]
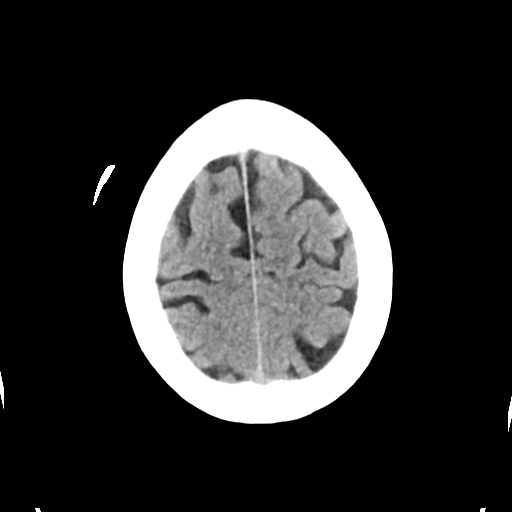
[im 28/32  brain]
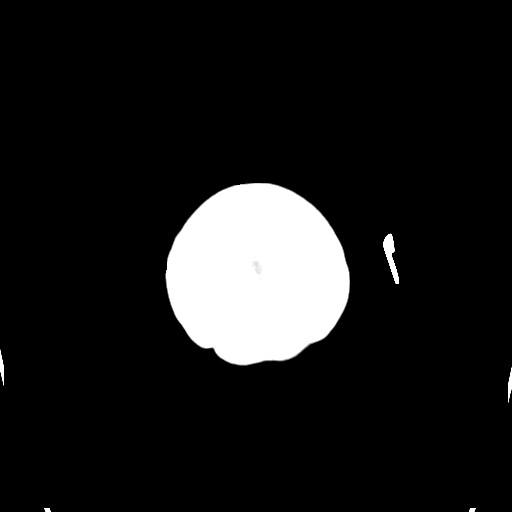

[Series 4: head bone · axial · 0.41mm/px · z∈[-187,-155]mm · 3 of 80 slices shown]
[im 8/80  bone]
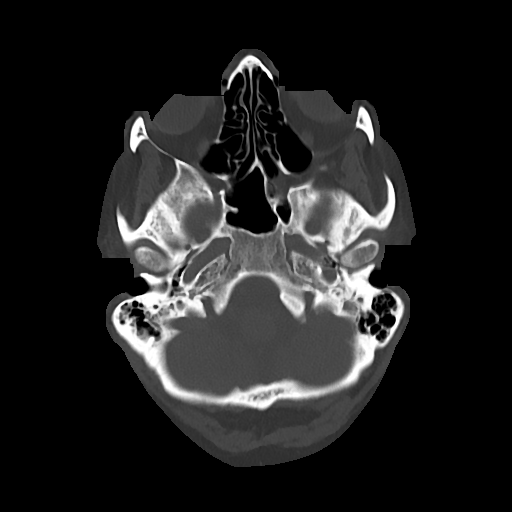
[im 16/80  bone]
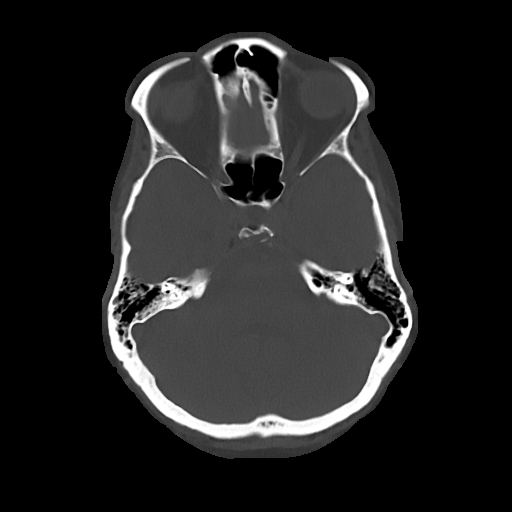
[im 24/80  bone]
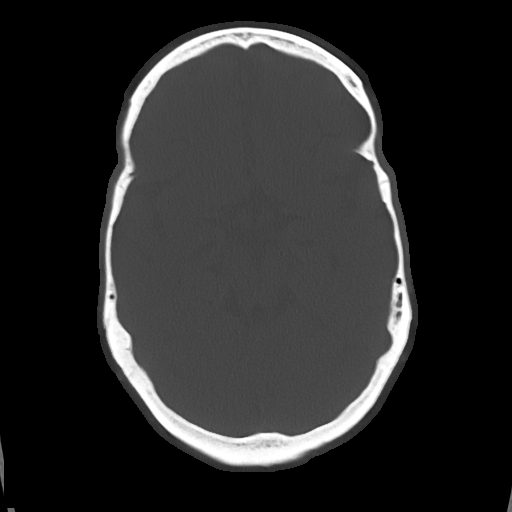

[Series 5: cor soft · coronal · 0.30mm/px · 3 of 67 slices shown]
[im 23/67  brain]
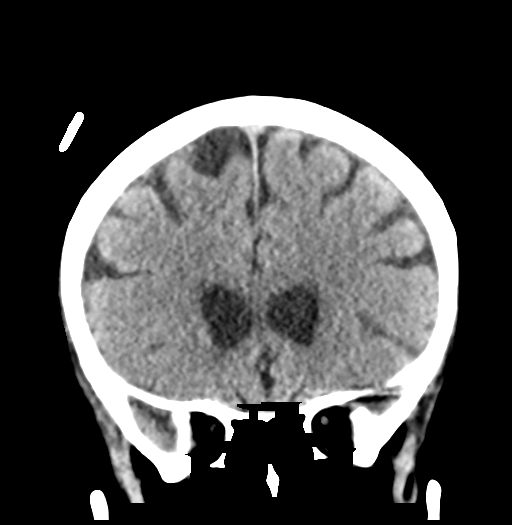
[im 30/67  brain]
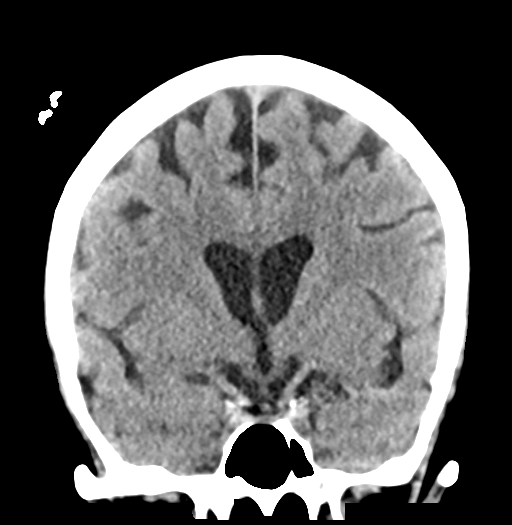
[im 37/67  brain]
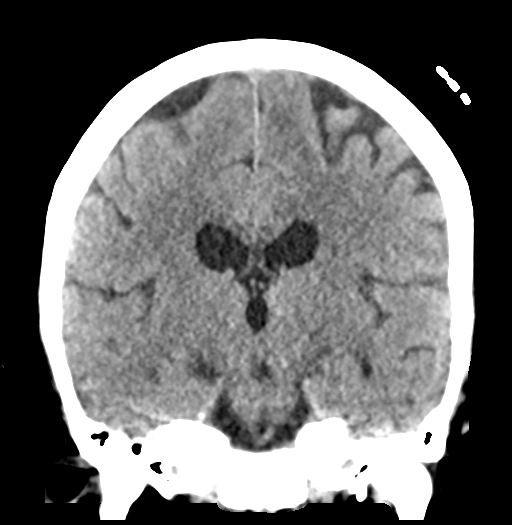

[Series 6: sag soft · sagittal · 0.31mm/px · 3 of 50 slices shown]
[im 17/50  brain]
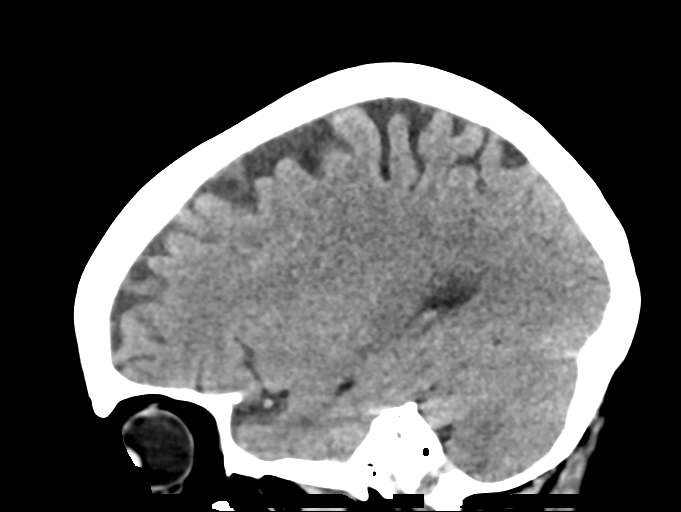
[im 25/50  brain]
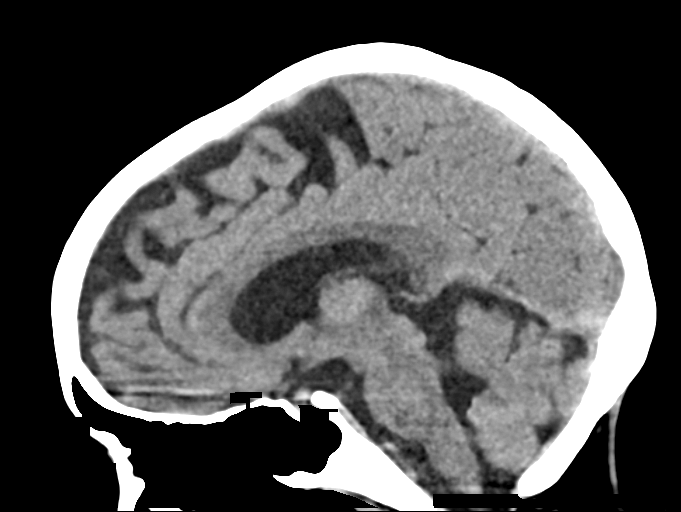
[im 33/50  brain]
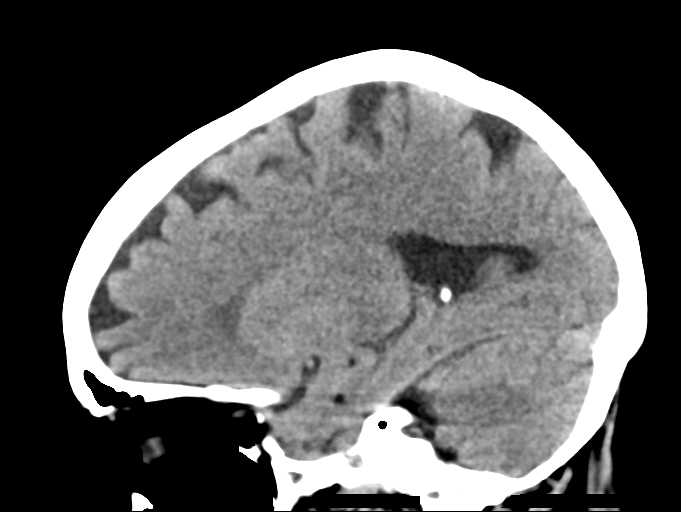

[16 of 47 positions shown; findings below may reference images not displayed]

FINDINGS: Brain: Generalized cerebral atrophy, mild but age advanced. No
intracranial hemorrhage, mass effect, or midline shift. No
hydrocephalus. The basilar cisterns are patent. No cerebral edema.
No evidence of territorial infarct or acute ischemia. No extra-axial
or intracranial fluid collection.

Vascular: No hyperdense vessel.

Skull: No fracture or focal lesion.

Sinuses/Orbits: Minimal opacification of right mastoid air cells.
Paranasal sinuses are clear. Visualized orbits are unremarkable.

Other: None.
IMPRESSION: 1.  No acute intracranial abnormality.
2. Mild, but age advanced generalized atrophy.

## 2020-01-20 IMAGING — DX DG CHEST 1V PORT
1 series · 1 of 1 positions shown · non-contrast
Comparison: 03/09/2017

CLINICAL DATA: Hypoxia

EXAM:
PORTABLE CHEST 1 VIEW

[chest]
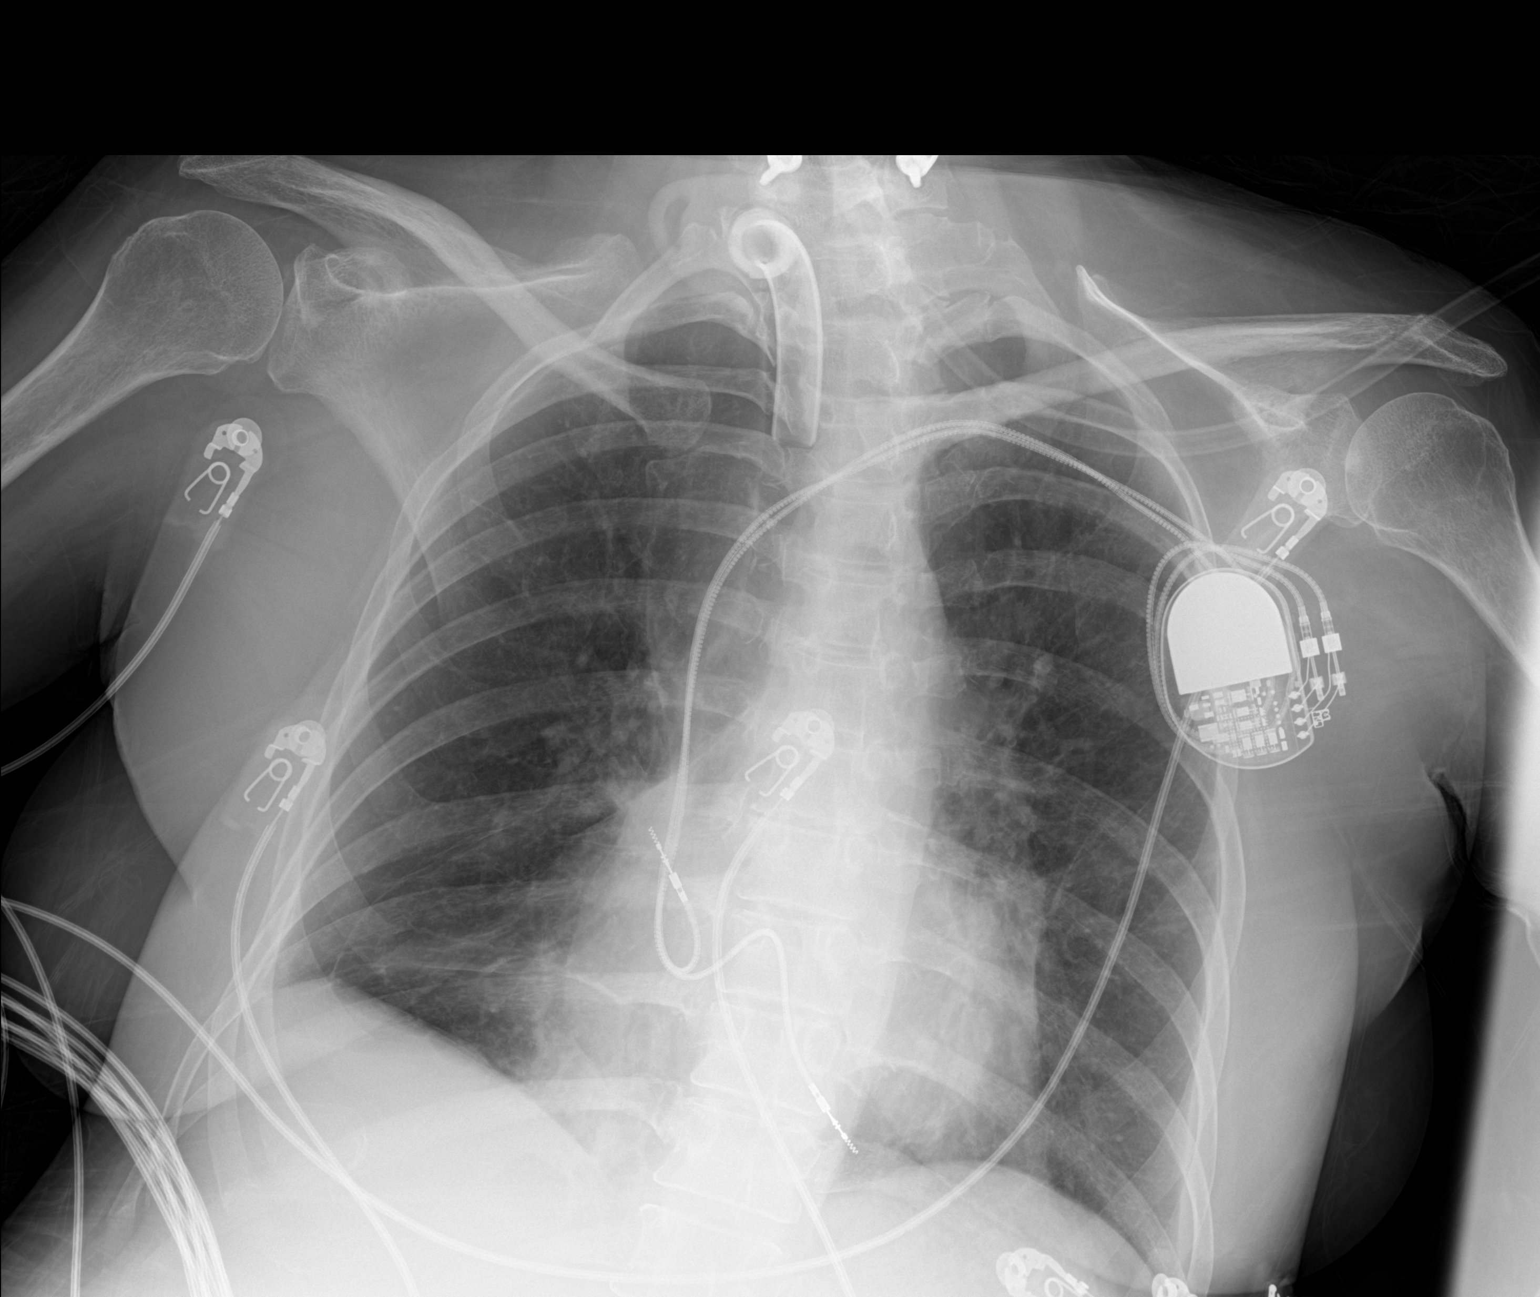

[1 of 1 positions shown; findings below may reference images not displayed]

FINDINGS: Unchanged right basilar scarring. Left chest wall pacemaker leads
are in unchanged position, projecting over the right atrium and
right ventricle. Cardiomediastinal contours are normal.
IMPRESSION: No active disease.

## 2020-01-22 IMAGING — DX DG CHEST 1V PORT
1 series · 1 of 1 positions shown · non-contrast
Comparison: Chest x-ray from yesterday.

CLINICAL DATA: Aspiration pneumonia.

EXAM:
PORTABLE CHEST 1 VIEW

[chest]
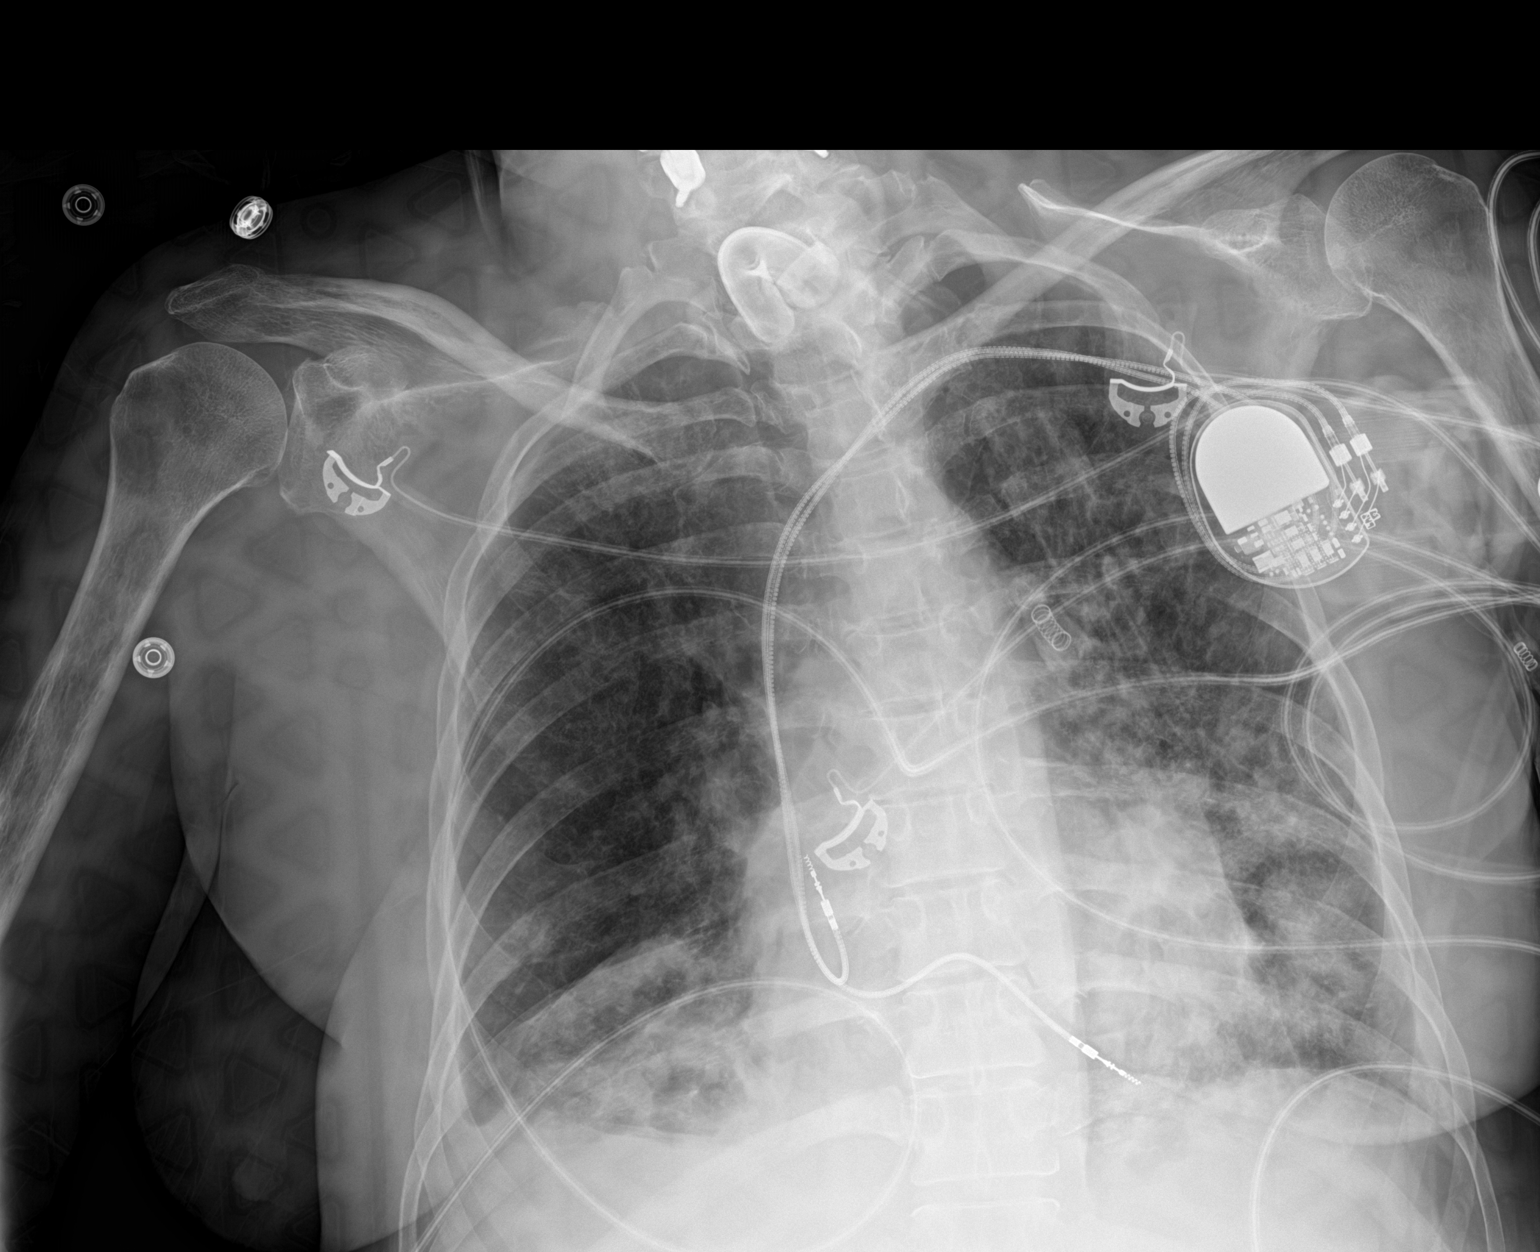

[1 of 1 positions shown; findings below may reference images not displayed]

FINDINGS: Unchanged left chest wall pacemaker and tracheostomy tube. Stable
cardiomediastinal silhouette. Normal pulmonary vascularity. Patchy
airspace disease appears worse at the right lung base, but is
unchanged in the mid and lower left lung. Small bilateral pleural
effusions are unchanged. No pneumothorax. No acute osseous
abnormality.
IMPRESSION: 1. Bilateral aspiration pneumonia, worsened at the right lung base.
2. Unchanged small bilateral pleural effusions.

## 2020-01-25 IMAGING — CT CT HEAD W/O CM
4 series · 17 of 47 positions shown, 19 images · non-contrast
Comparison: CT scan of December 16, 2017.

CLINICAL DATA: Altered mental status.

EXAM:
CT HEAD WITHOUT CONTRAST
TECHNIQUE: Contiguous axial images were obtained from the base of the skull
through the vertex without intravenous contrast.

[Series 3: head wo · axial · 0.41mm/px · z∈[+1219,+1344]mm · 7 of 35 slices shown, 9 images]
[im 5/35  brain]
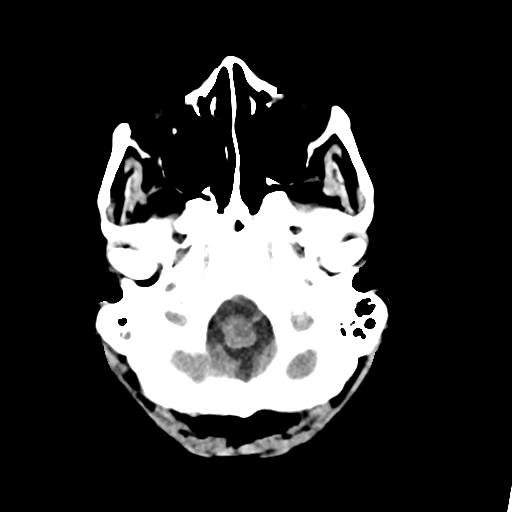
[im 5/35  bone]
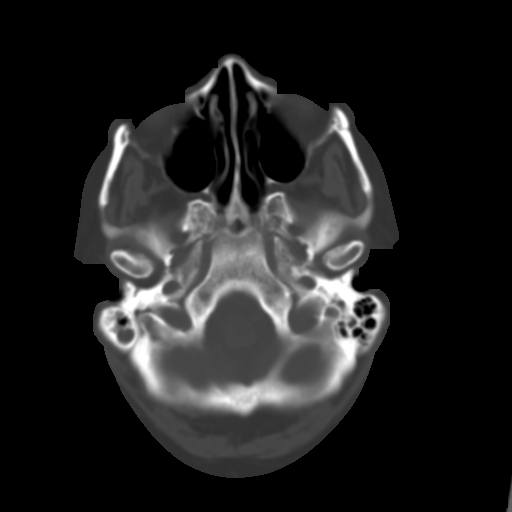
[im 9/35  brain]
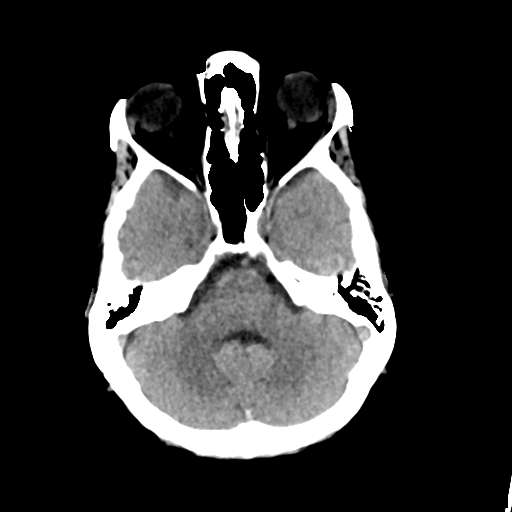
[im 13/35  brain]
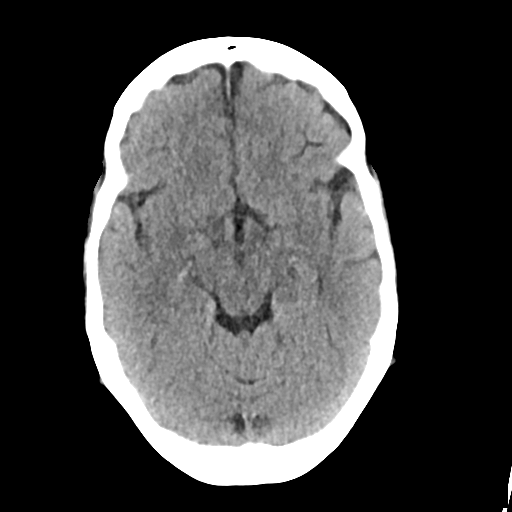
[im 18/35  brain]
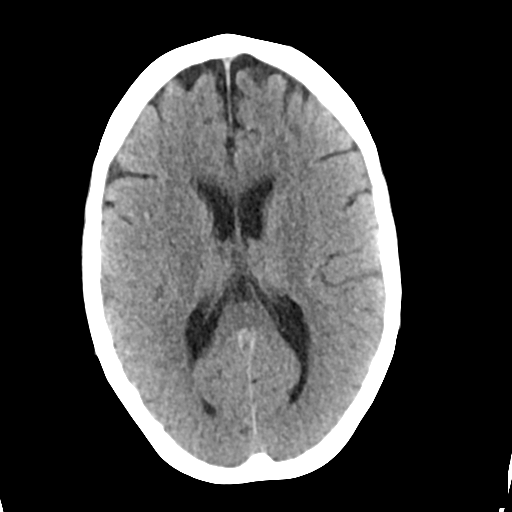
[im 22/35  brain]
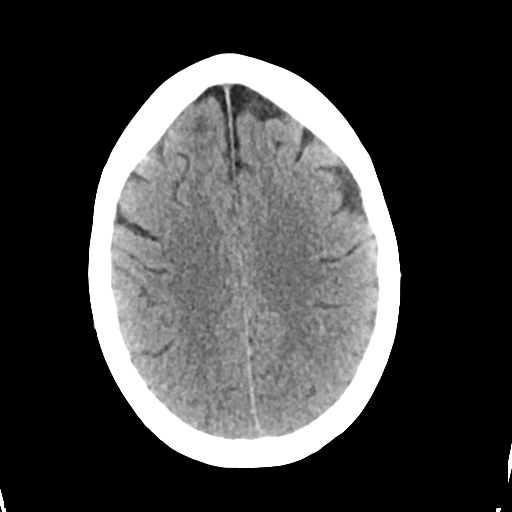
[im 22/35  bone]
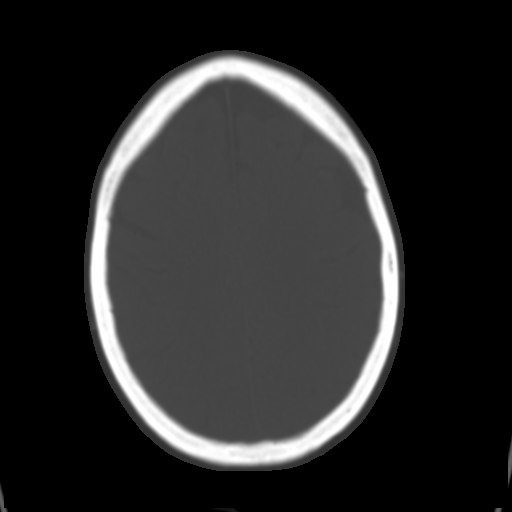
[im 26/35  brain]
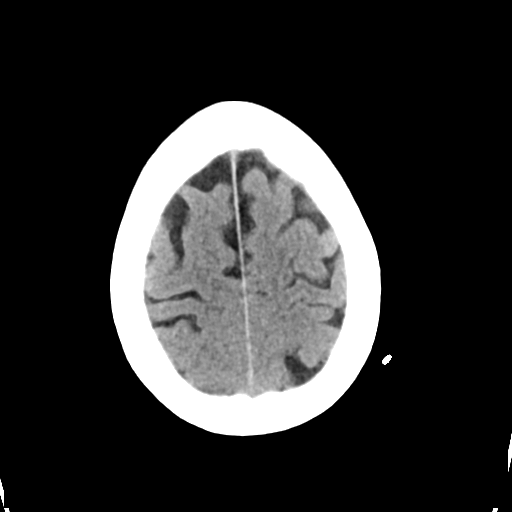
[im 30/35  brain]
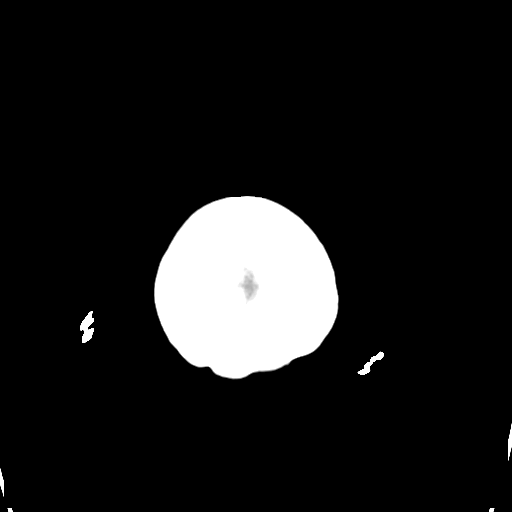

[Series 4: head bone · axial · 0.41mm/px · z∈[+1215,+1275]mm · 4 of 86 slices shown]
[im 9/86  bone]
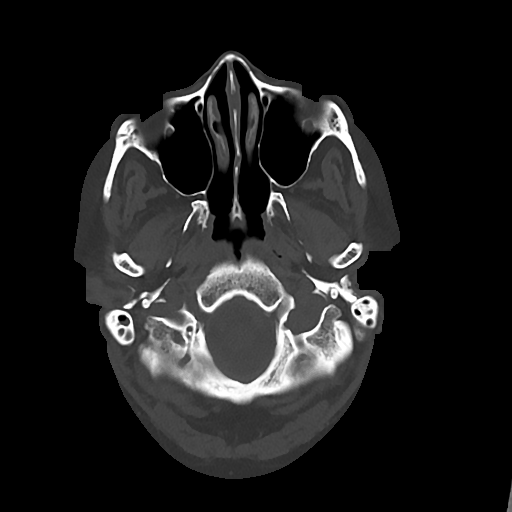
[im 18/86  bone]
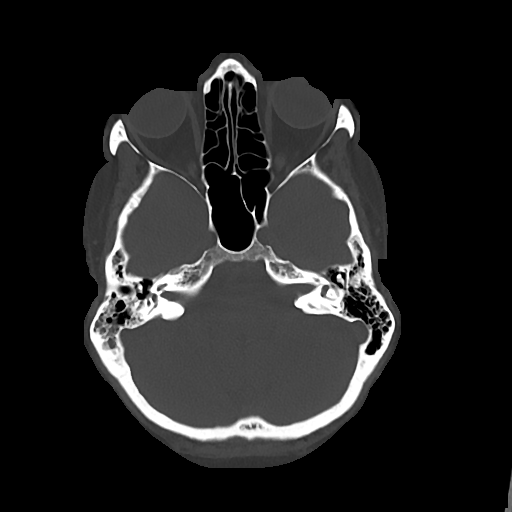
[im 26/86  bone]
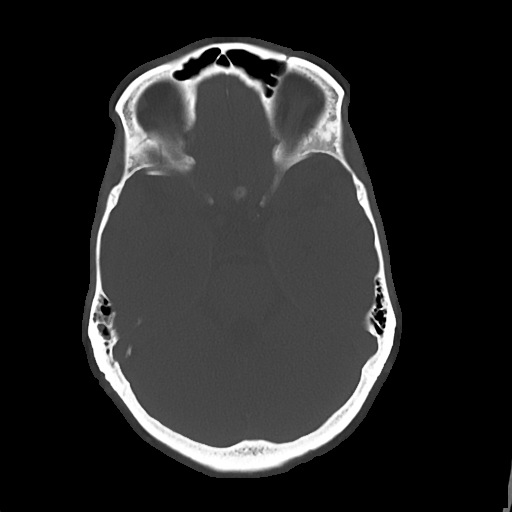
[im 39/86  bone]
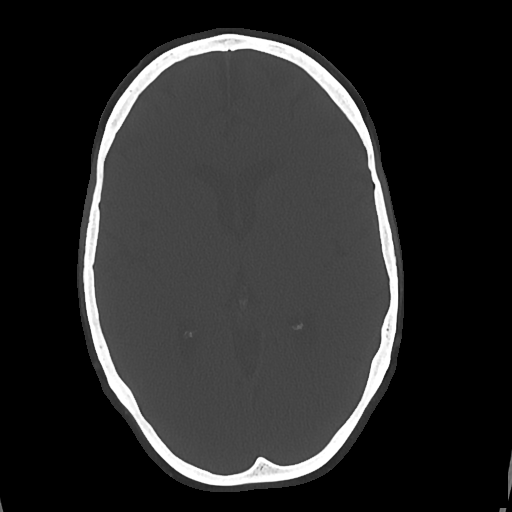

[Series 5: cor soft · coronal · 0.31mm/px · 3 of 67 slices shown]
[im 23/67  brain]
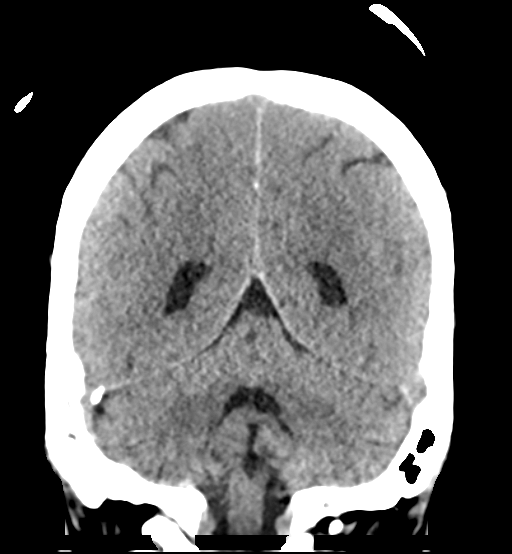
[im 30/67  brain]
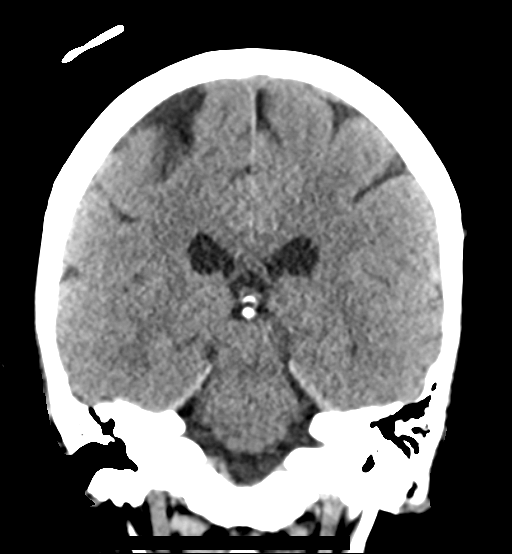
[im 37/67  brain]
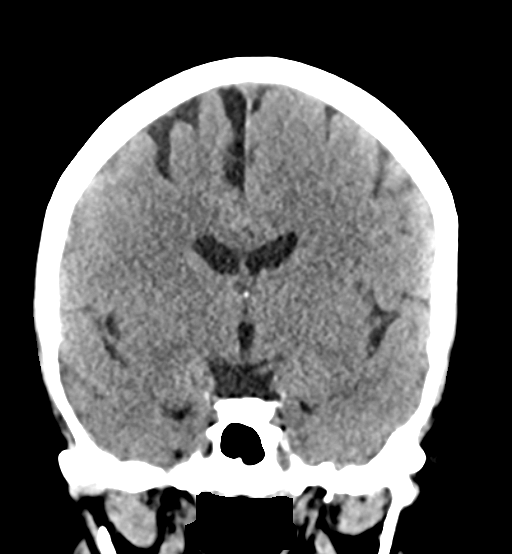

[Series 6: sag soft · sagittal · 0.33mm/px · 3 of 54 slices shown]
[im 18/54  brain]
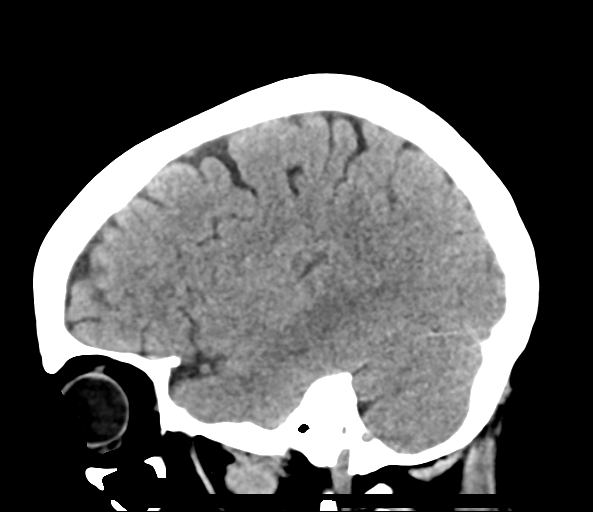
[im 27/54  brain]
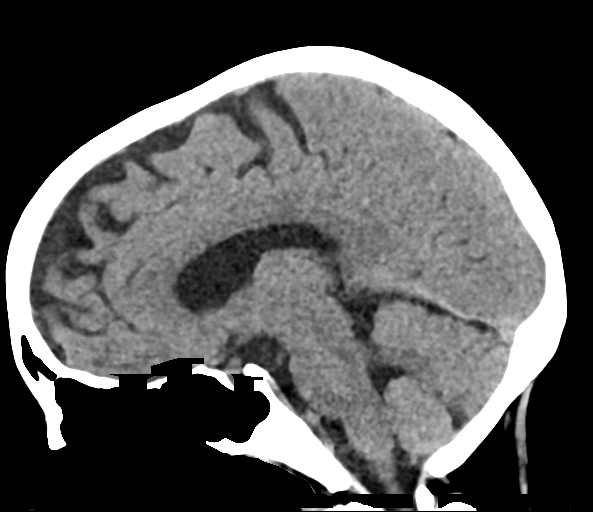
[im 36/54  brain]
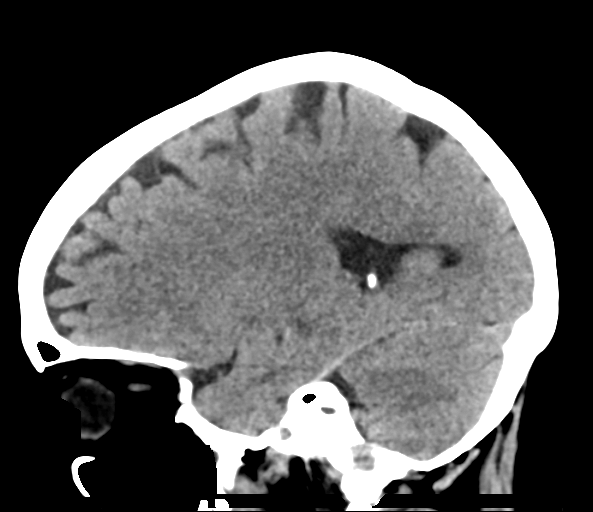

[17 of 47 positions shown; findings below may reference images not displayed]

FINDINGS: Brain: No evidence of acute infarction, hemorrhage, hydrocephalus,
extra-axial collection or mass lesion/mass effect.

Vascular: No hyperdense vessel or unexpected calcification.

Skull: Normal. Negative for fracture or focal lesion.

Sinuses/Orbits: No acute finding.

Other: Fluid is noted in right mastoid air cells.
IMPRESSION: No acute intracranial abnormality seen.

## 2020-01-25 IMAGING — DX DG CHEST 1V
1 series · 1 of 1 positions shown · non-contrast
Comparison: Chest radiograph 12/21/2017

CLINICAL DATA: Patient with history of pneumonia.

EXAM:
CHEST  1 VIEW

[chest]
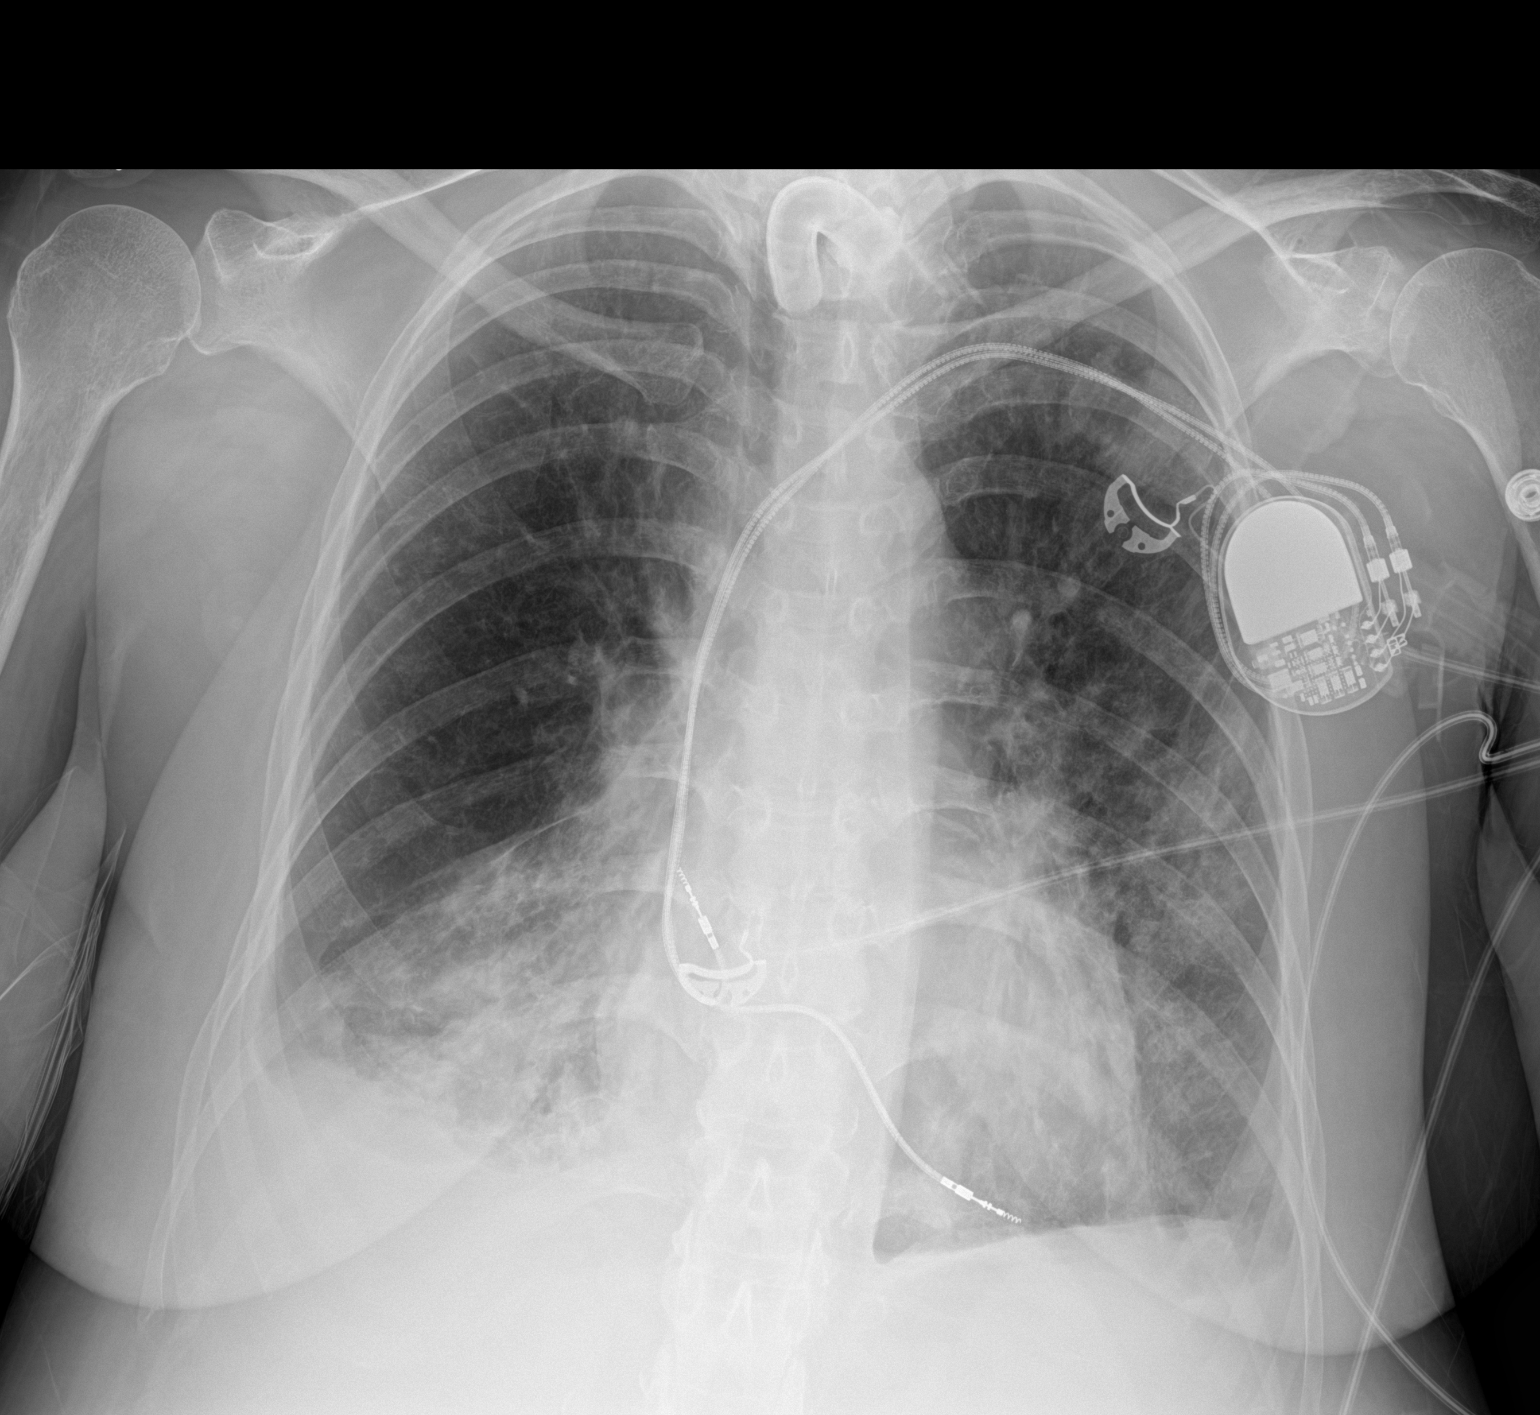

[1 of 1 positions shown; findings below may reference images not displayed]

FINDINGS: Multi lead pacer apparatus overlies the left hemithorax.
Tracheostomy tube terminates in the mid trachea. Monitoring leads
overlie the patient. Stable cardiac and mediastinal contours.
Similar-appearing multifocal opacities within the left mid and lower
lung and right lung base. Small bilateral pleural effusions. No
pneumothorax.
IMPRESSION: Similar-appearing multifocal opacities concerning for pneumonia in
the appropriate clinical setting. Small bilateral pleural effusions.

## 2020-01-25 IMAGING — DX DG CHEST 1V PORT
1 series · 1 of 1 positions shown · non-contrast
Comparison: Prior radiograph from 12/20/2017

CLINICAL DATA: Follow-up examination for pneumonia.

EXAM:
PORTABLE CHEST 1 VIEW

[chest ap]
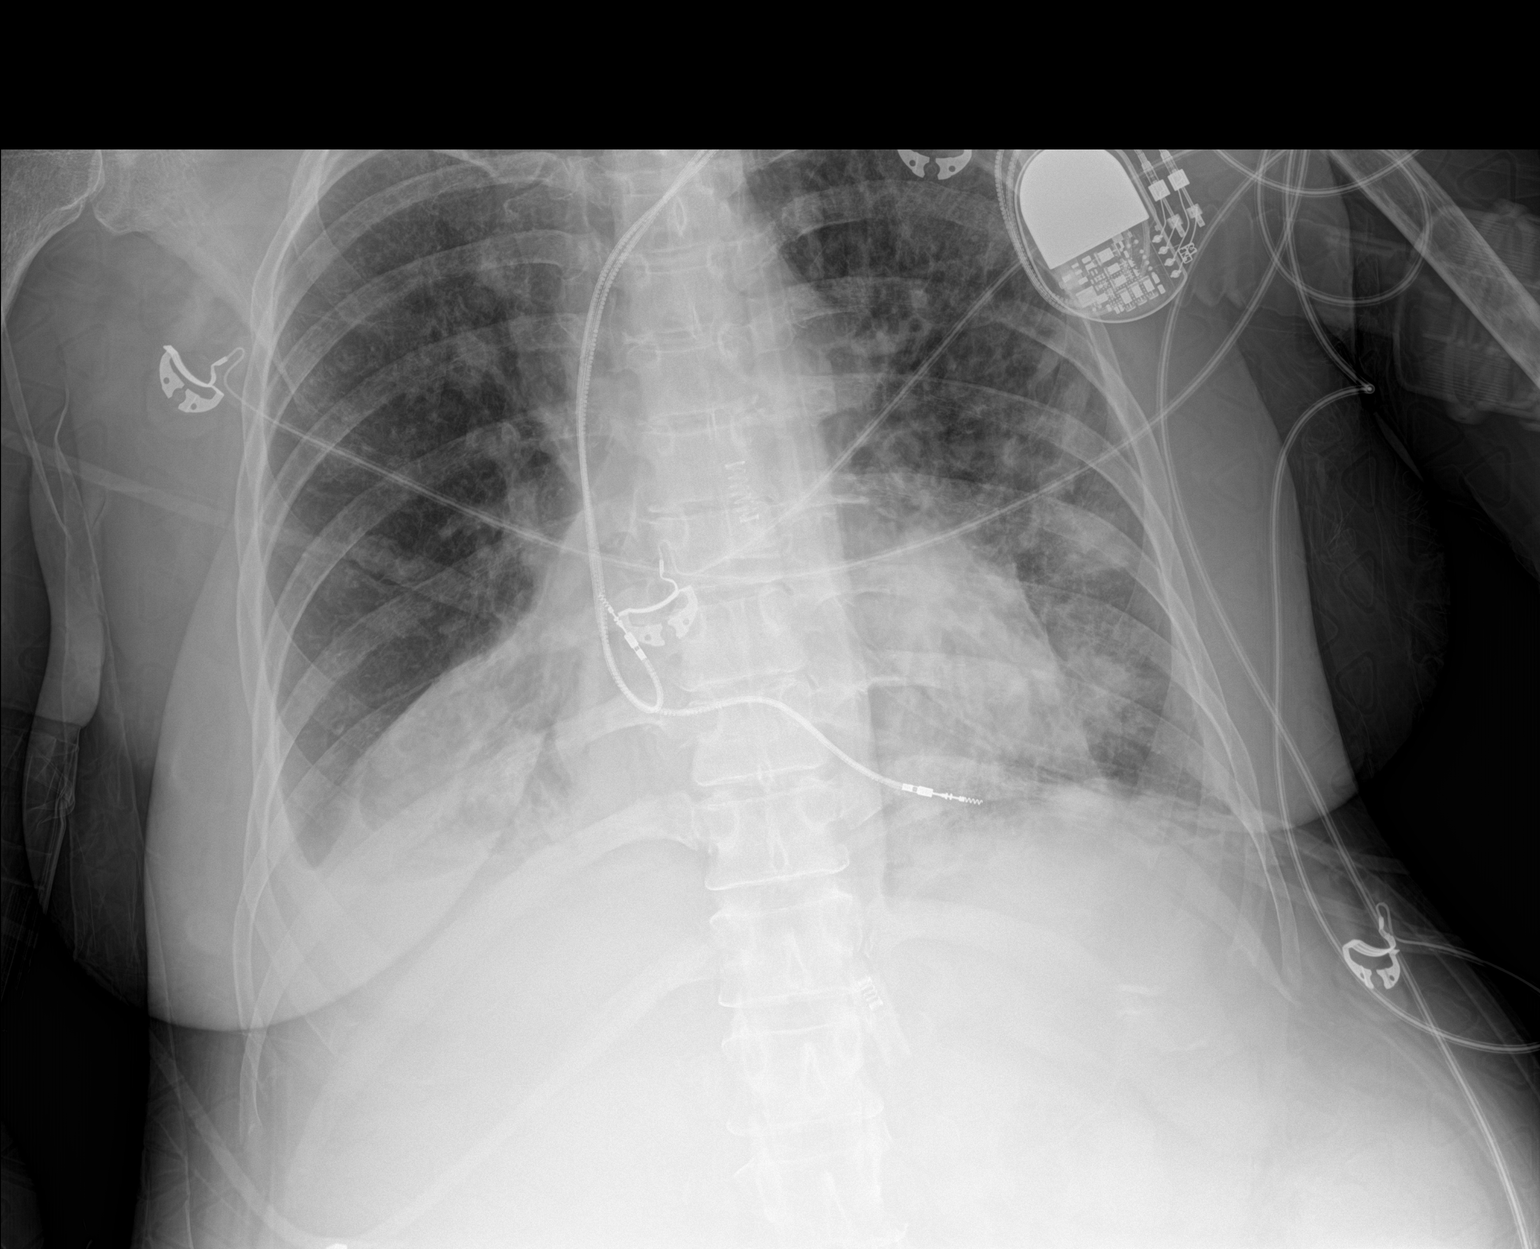

[1 of 1 positions shown; findings below may reference images not displayed]

FINDINGS: Left-sided pacemaker/AICD in place. Tracheostomy tube partially
visualized overlying the upper airway. Stable cardiomegaly.
Mediastinal silhouette normal.

Lungs mildly hypoinflated. Persistent patchy multifocal opacities
within the mid and lower left lung, concerning for infection.
Layering right pleural effusion with associated dense right basilar
opacity, which could reflect atelectasis or infiltrate. Small
layering left pleural effusion present as well. No visible
pneumothorax.

Osseous structures unchanged. Perihilar vascular congestion without
overt pulmonary edema.
IMPRESSION: 1. Persistent patchy multifocal opacities within the mid and lower
left lung, concerning for pneumonia.
2. Layering right pleural effusion with associated dense right
basilar opacity, which could reflect atelectasis or additional
infiltrate.
3. Stable cardiomegaly with mild perihilar vascular congestion
without overt pulmonary edema.

## 2020-01-26 IMAGING — DX DG CHEST 1V PORT
2 series · 2 of 2 positions shown · non-contrast
Comparison: 12/21/2017

CLINICAL DATA: Shortness of breath

EXAM:
PORTABLE CHEST 1 VIEW

[chest ap (1 of 2)]
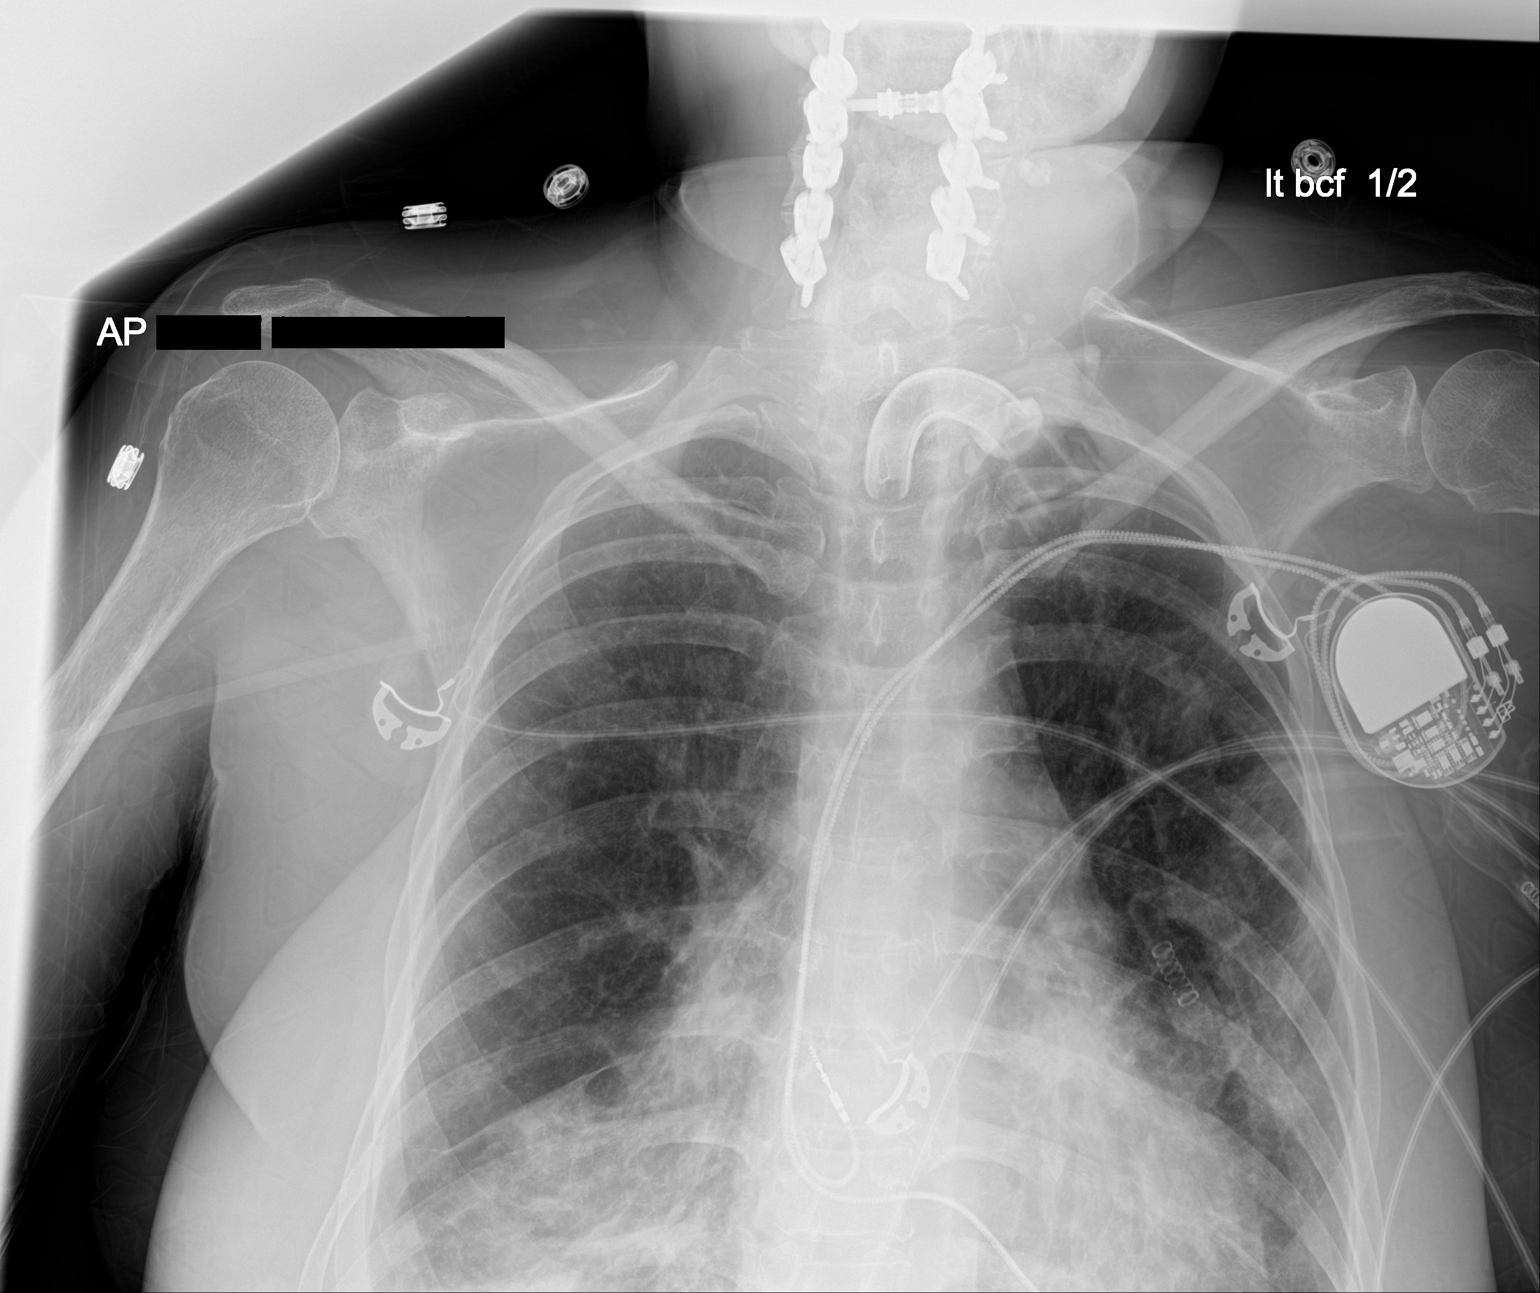

[chest ap (2 of 2)]
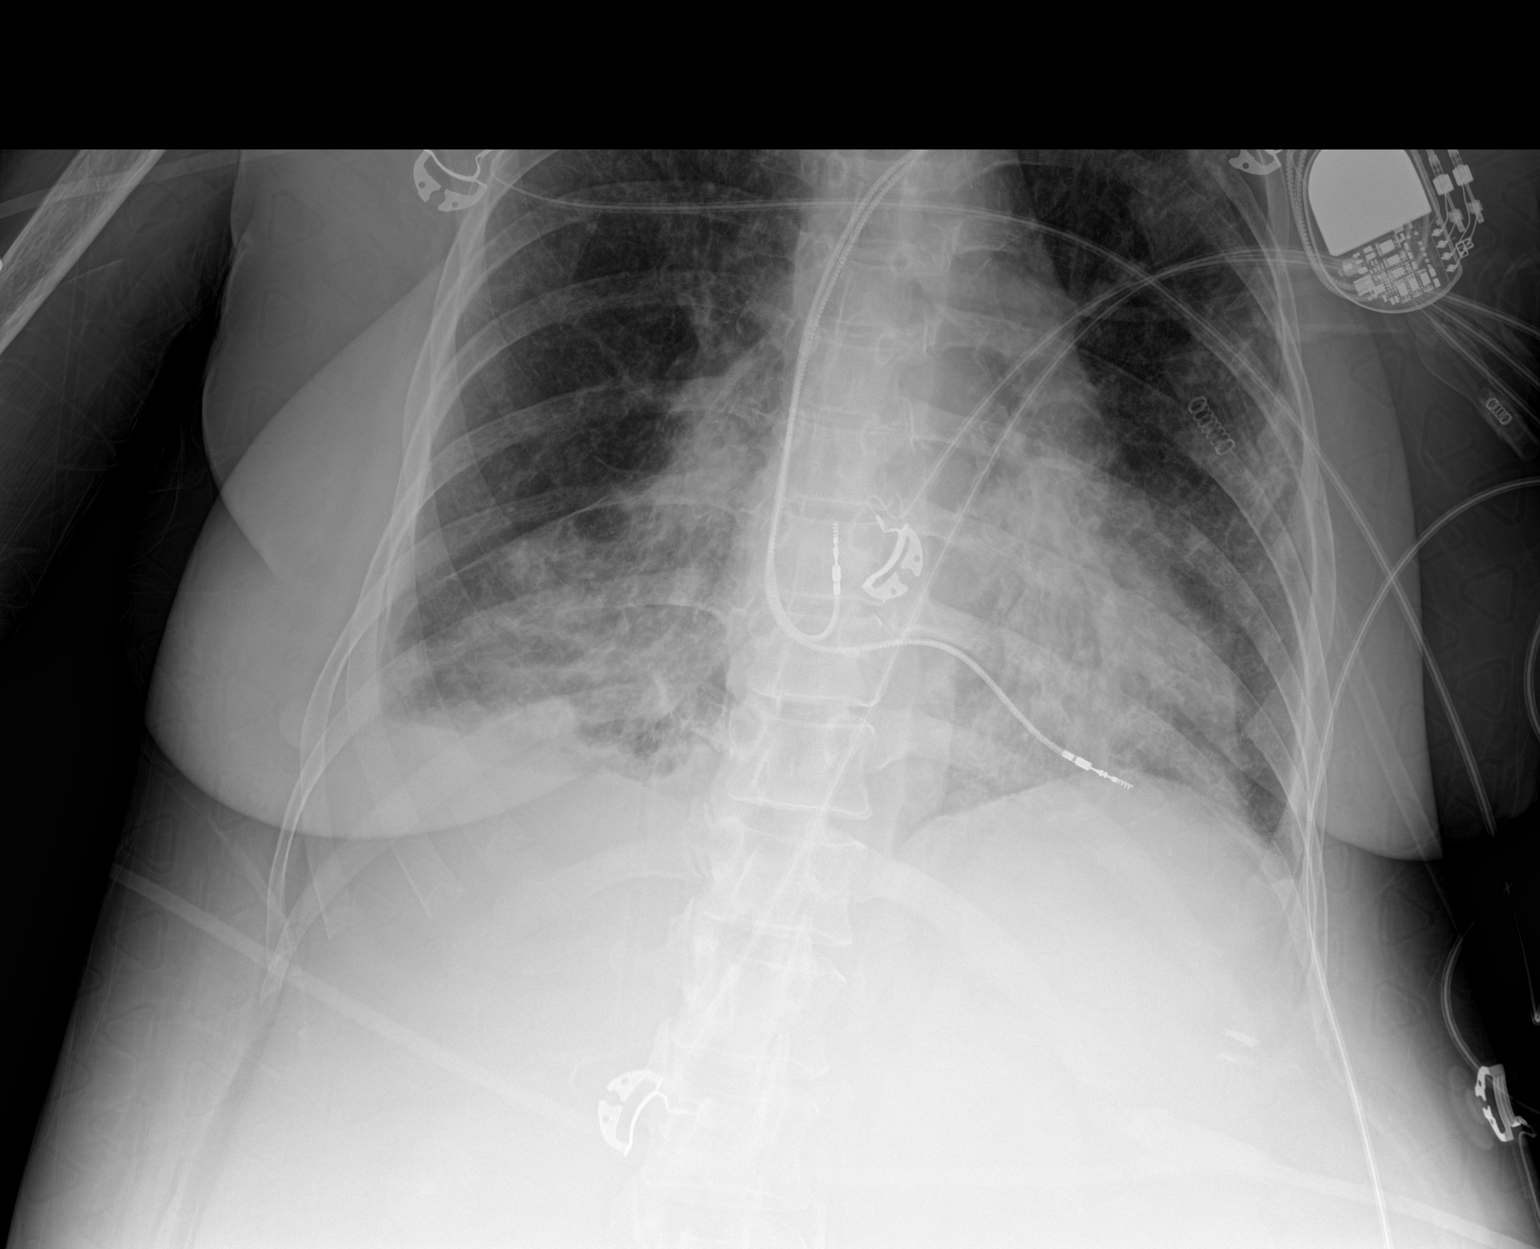

[2 of 2 positions shown; findings below may reference images not displayed]

FINDINGS: Cardiac shadow is stable. Pacing device is again noted and stable.
Tracheostomy tube and postsurgical changes in the cervical spine are
again noted. The lungs are well aerated bilaterally with patchy
infiltrates in the bases bilaterally stable from the previous exam.
Small effusions are also noted.
IMPRESSION: Stable multifocal infiltrates

## 2020-01-28 IMAGING — DX DG CHEST 1V PORT
1 series · 1 of 1 positions shown · non-contrast
Comparison: Portable chest x-ray of 12/22/2016

CLINICAL DATA: Tracheostomy.

EXAM:
PORTABLE CHEST 1 VIEW

[chest]
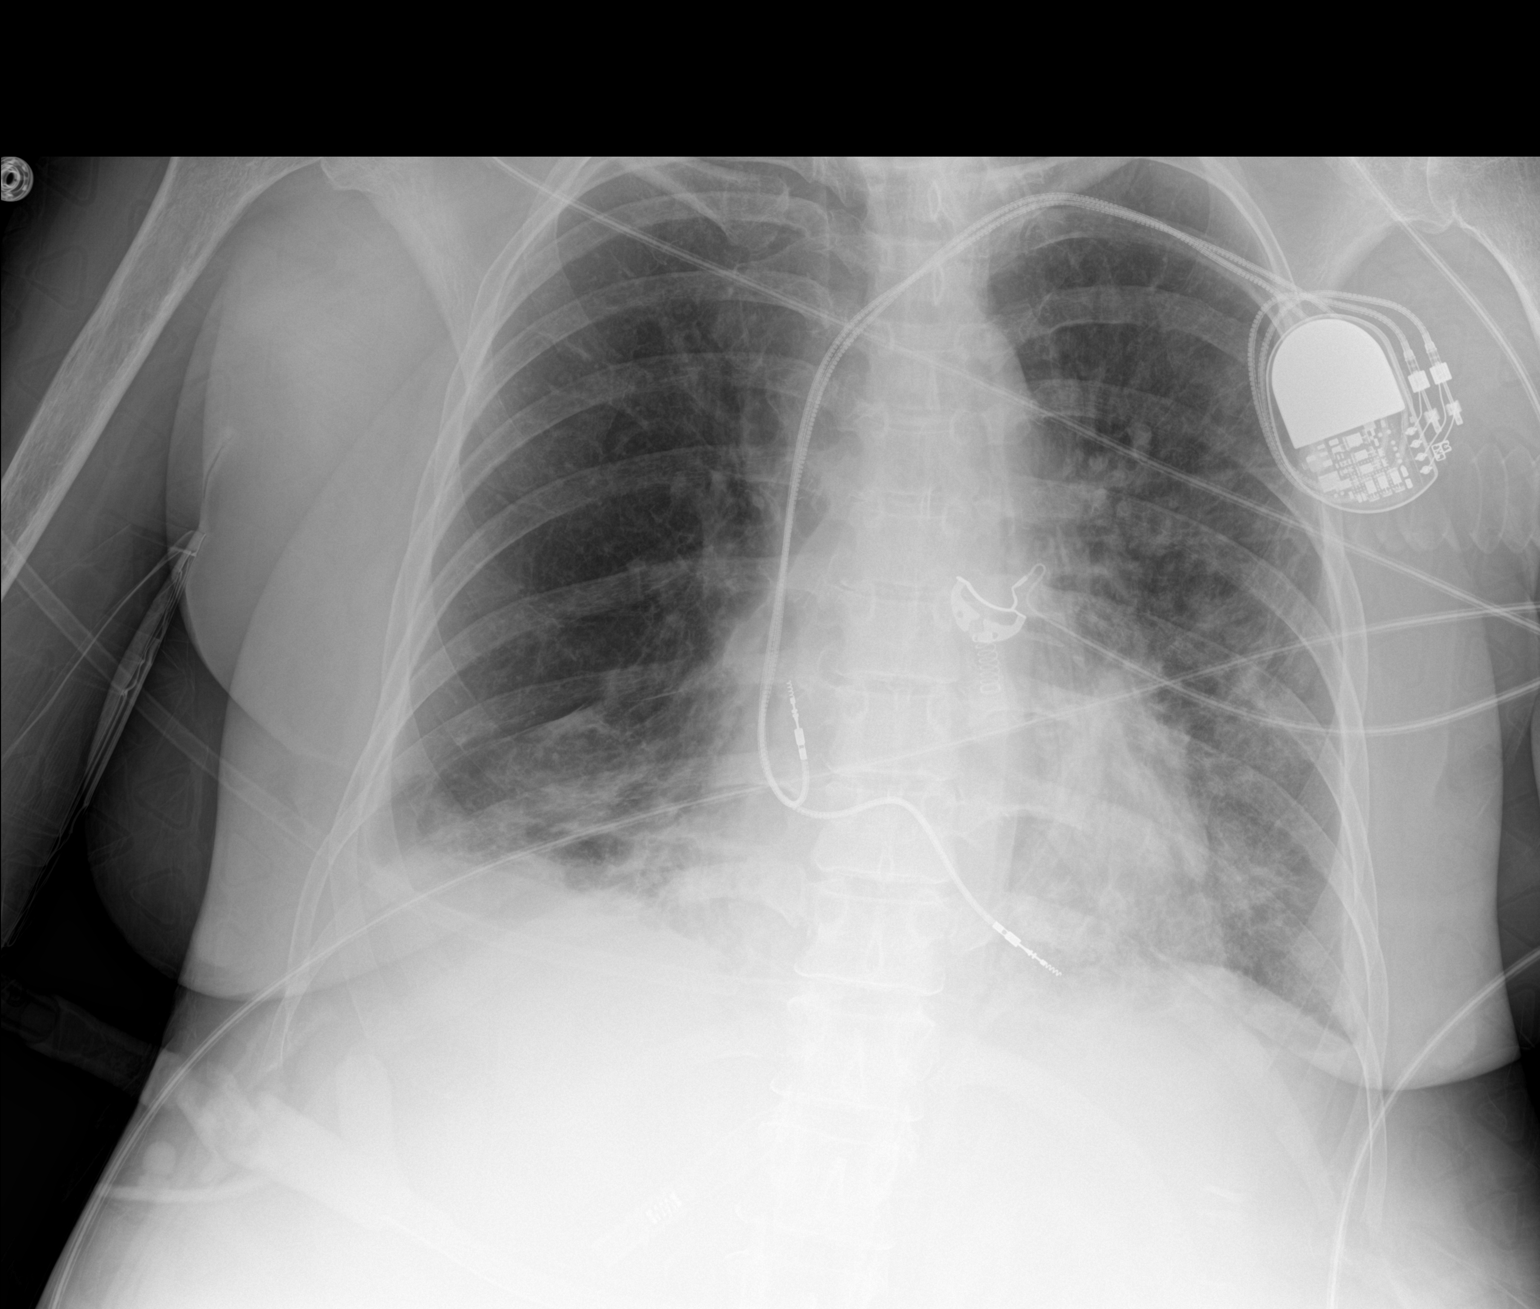

[1 of 1 positions shown; findings below may reference images not displayed]

FINDINGS: Tracheostomy appears to be in good position with the tip overlying
the upper tracheal air shadow. However there are parenchymal
opacities remaining in both lung bases most consistent with
multifocal pneumonia. From pacemaker is present in the heart is
mildly enlarged.
IMPRESSION: 1. Persistent basilar opacities left-greater-than-right most
consistent with multifocal pneumonia with proximal small effusions.
2. Tracheostomy appears to be in good position.
3. Permanent pacemaker remains.

## 2020-02-03 IMAGING — DX DG ABDOMEN 1V
2 series · 2 of 2 positions shown · non-contrast
Comparison: CT 12/16/2017

CLINICAL DATA: Abdominal distension

EXAM:
ABDOMEN - 1 VIEW

[abdomen kub (1 of 2)]
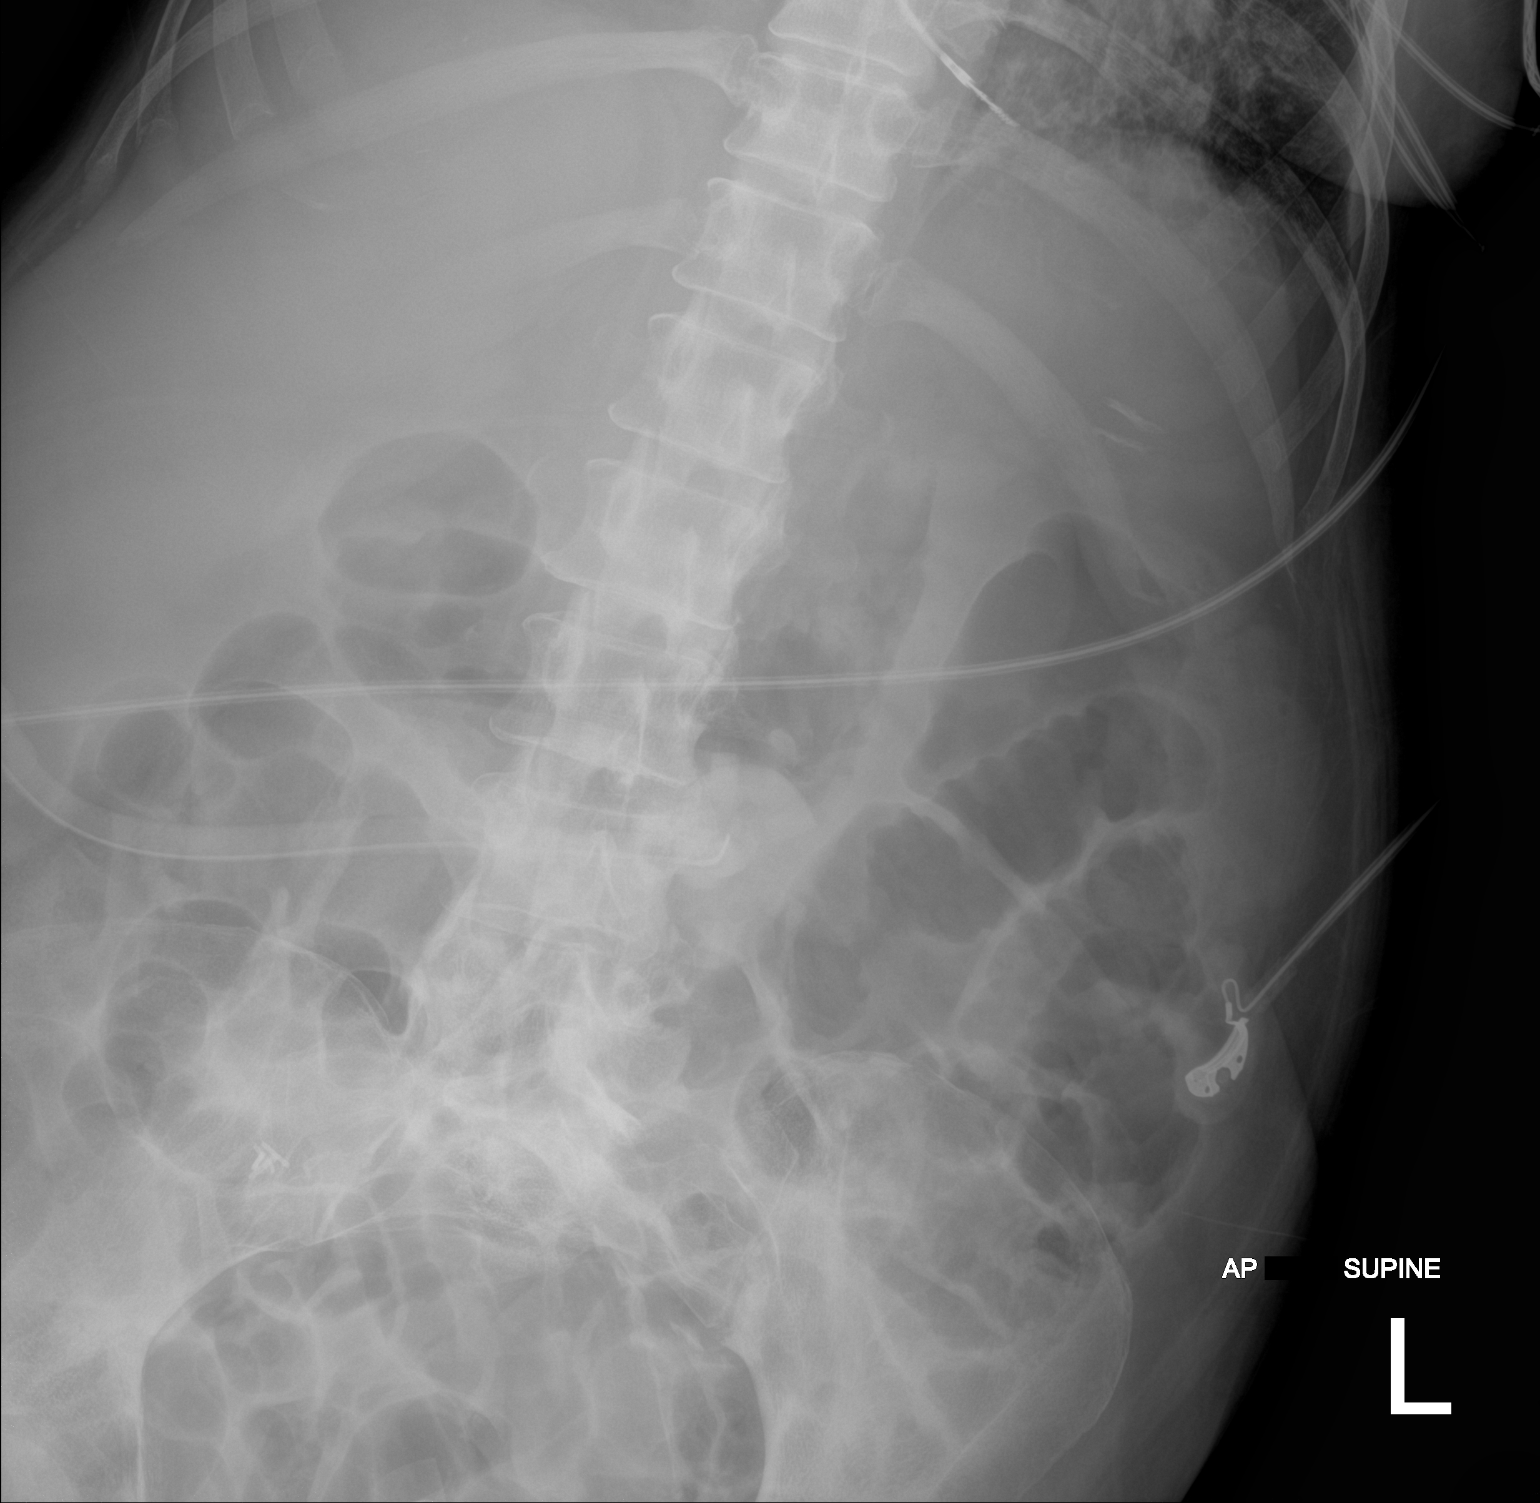

[abdomen kub (2 of 2)]
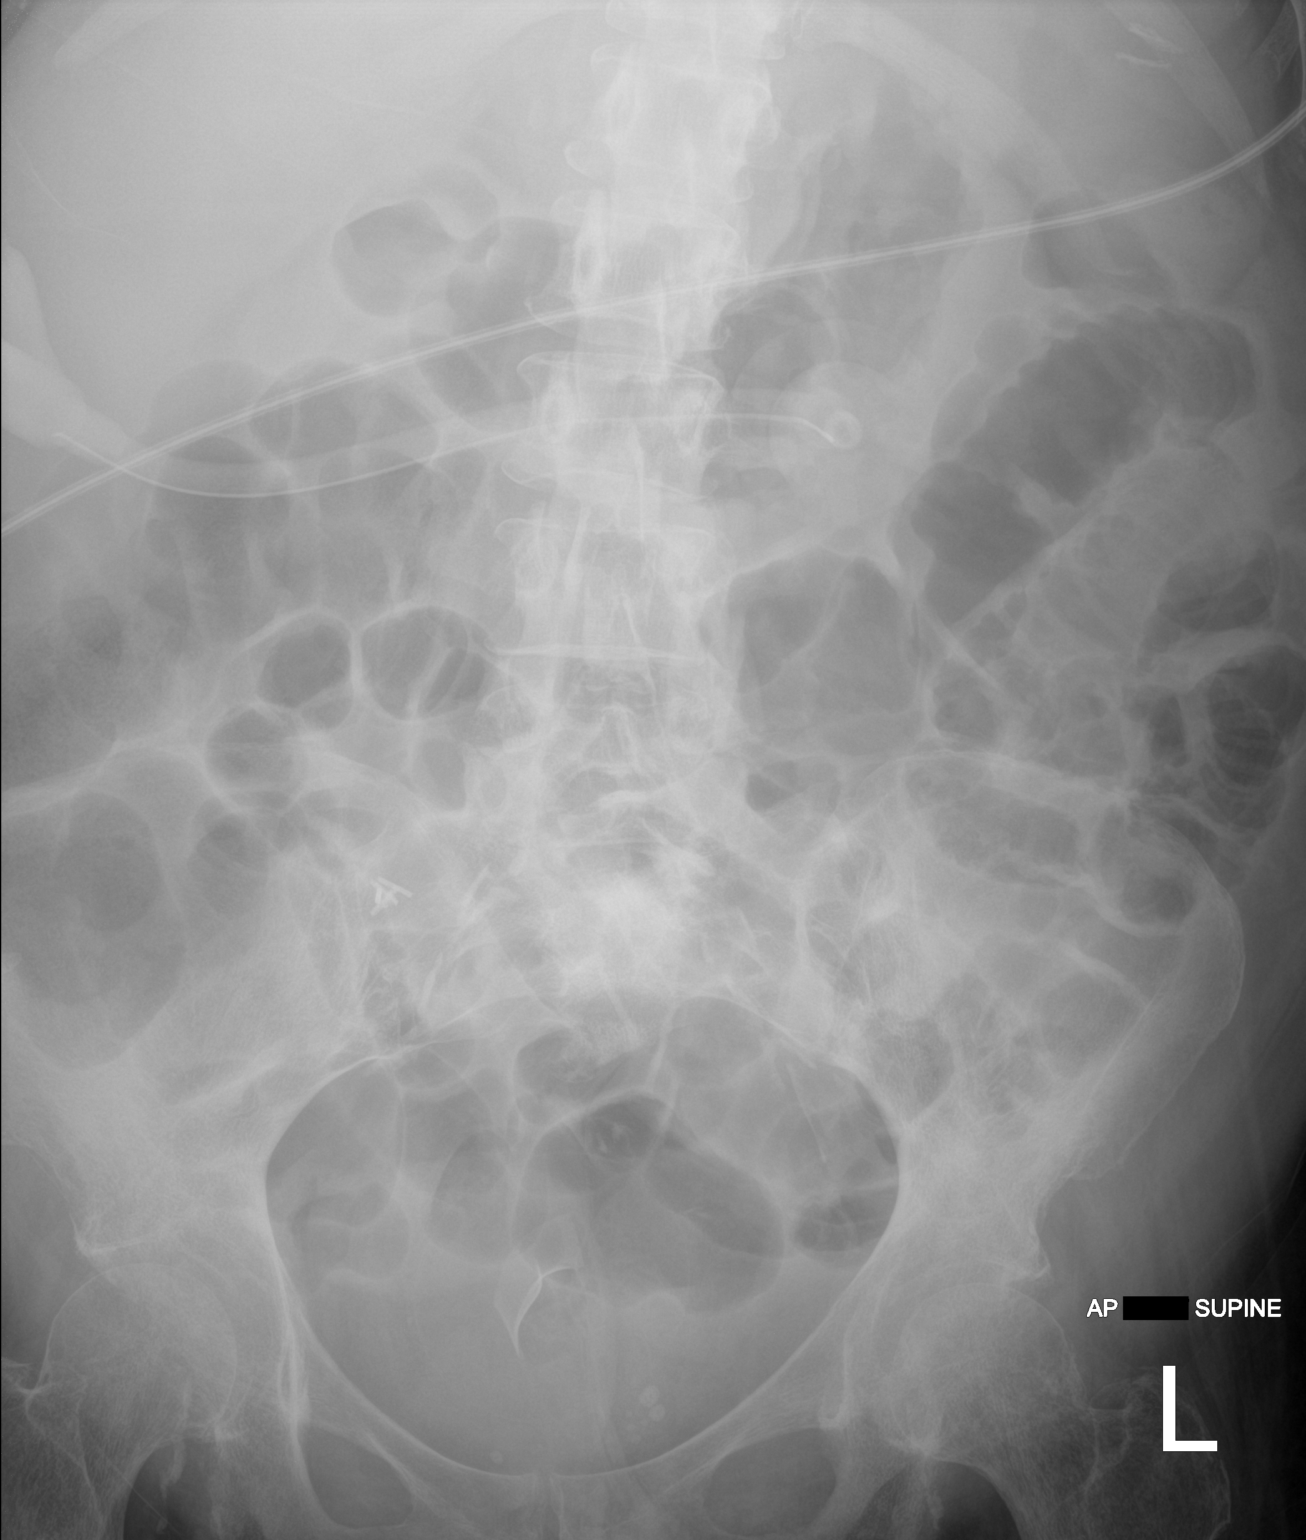

[2 of 2 positions shown; findings below may reference images not displayed]

FINDINGS: Gastrostomy tube in the left upper quadrant. Mild diffuse gaseous
dilatation of small and large bowel with small bowel dilated up to
3.1 cm. Rectal gas is present. Clips in the right pelvis.
Curvilinear opacity in the central pelvis.
IMPRESSION: 1. Mild diffuse gaseous dilatation of small and large bowel
suggestive of ileus
2. Curvilinear radiopacity in the central to low pelvis, possible
artifact

## 2020-02-04 IMAGING — DX DG ABDOMEN 1V
1 series · 1 of 1 positions shown · non-contrast
Comparison: 12/30/2017.  CT 03/09/2017.

CLINICAL DATA: Abdominal distention.

EXAM:
ABDOMEN - 1 VIEW

[abdomen]
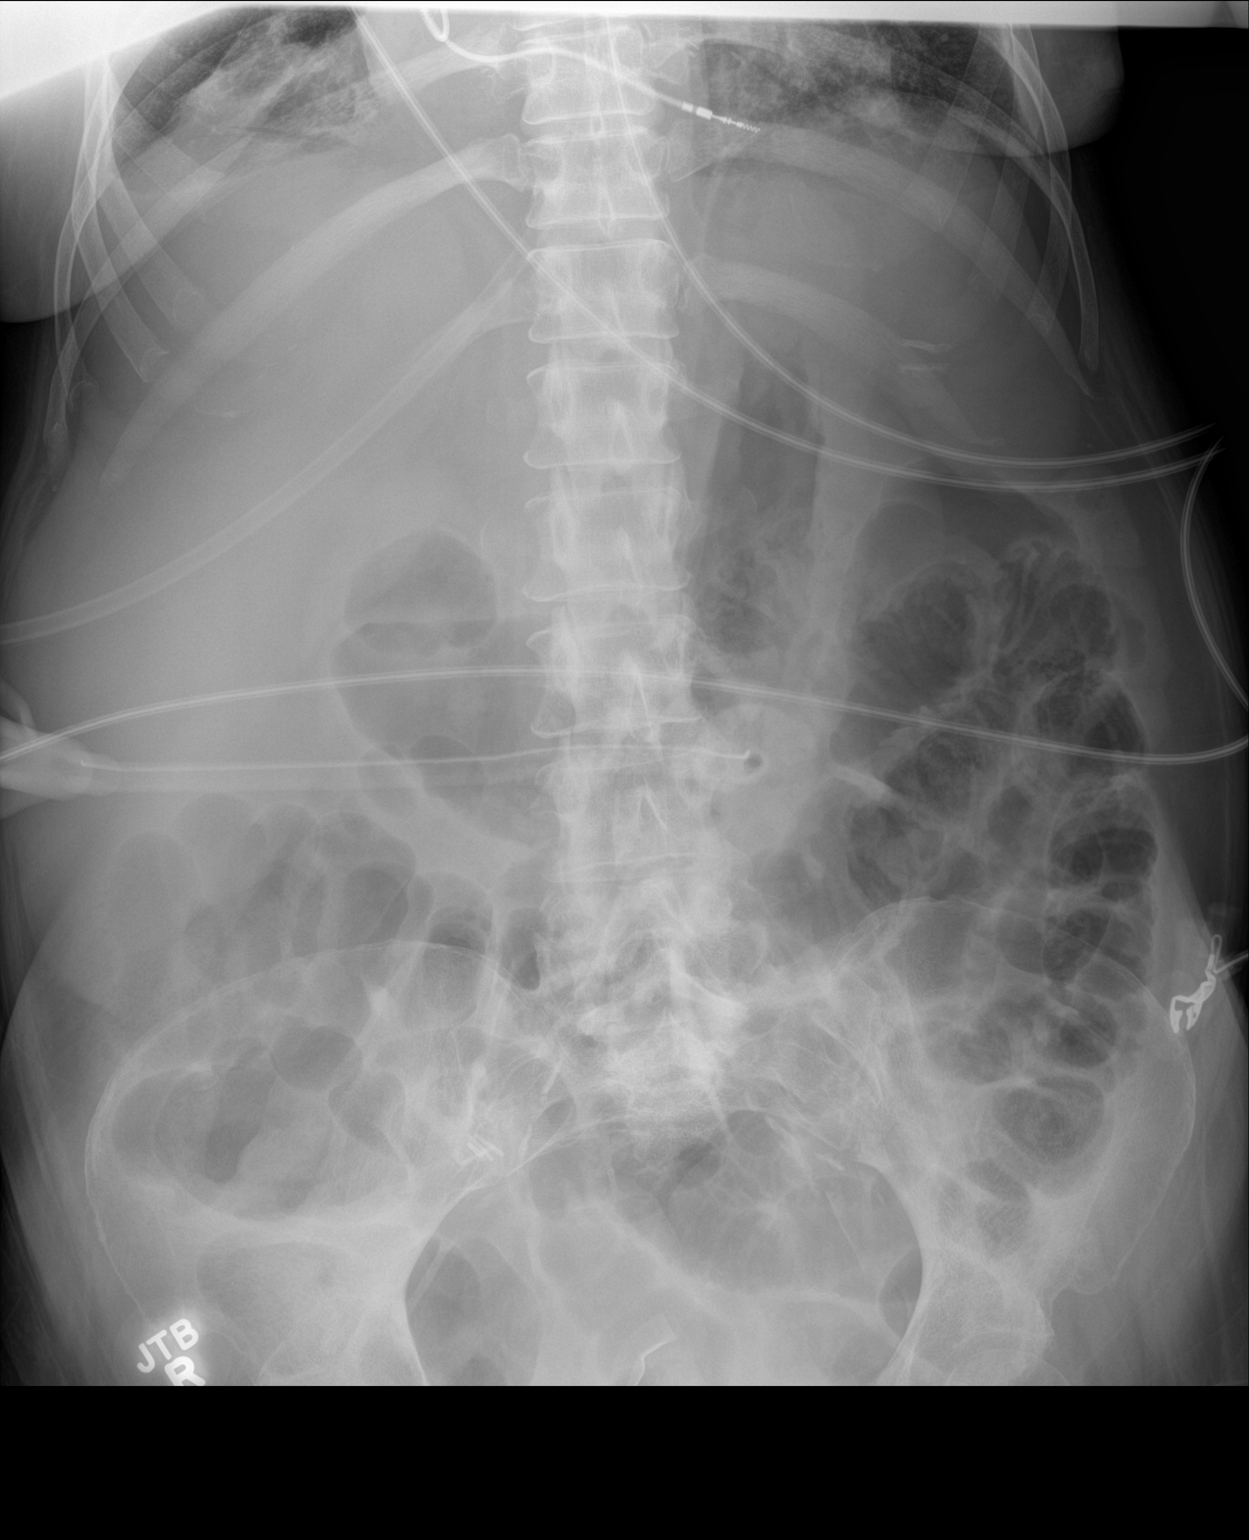

[1 of 1 positions shown; findings below may reference images not displayed]

FINDINGS: Gastrostomy tube noted in stable position. Curvilinear radiopacity
is again noted over the pelvis. This could represent a structure
within or on the outside the patient, clinical correlation
suggested. Loops of small bowel noted. Colon is nondistended. No
free air. No acute bony abnormality.
IMPRESSION: 1. Distended loops of small bowel. Developing small bowel
obstruction could present in this fashion. Follow-up imaging to
demonstrate resolution suggested.

2. Linear density noted over the right lung base most likely related
to atelectasis as opposed to free intraperitoneal air.
Left-side-down lateral decubitus view to exclude free air is
suggested however.

3.  Bibasilar atelectasis and/or infiltrates are noted.

Critical Value/emergent results were called by telephone at the time
of interpretation on 12/31/2017 at [DATE] to nurse Kahlen, who
verbally acknowledged these results.

## 2020-02-04 IMAGING — DX DG ABDOMEN DECUB ONLY 1V
1 series · 1 of 1 positions shown · non-contrast
Comparison: KUB earlier same day at [DATE] a.m. and yesterday. CT
abdomen and pelvis 12/16/2017.

CLINICAL DATA: Possible developing small-bowel obstruction on AP
supine abdominal image earlier today. Evaluate for free
intraperitoneal air.

EXAM:
ABDOMEN - 1 VIEW DECUBITUS

[abdomen]
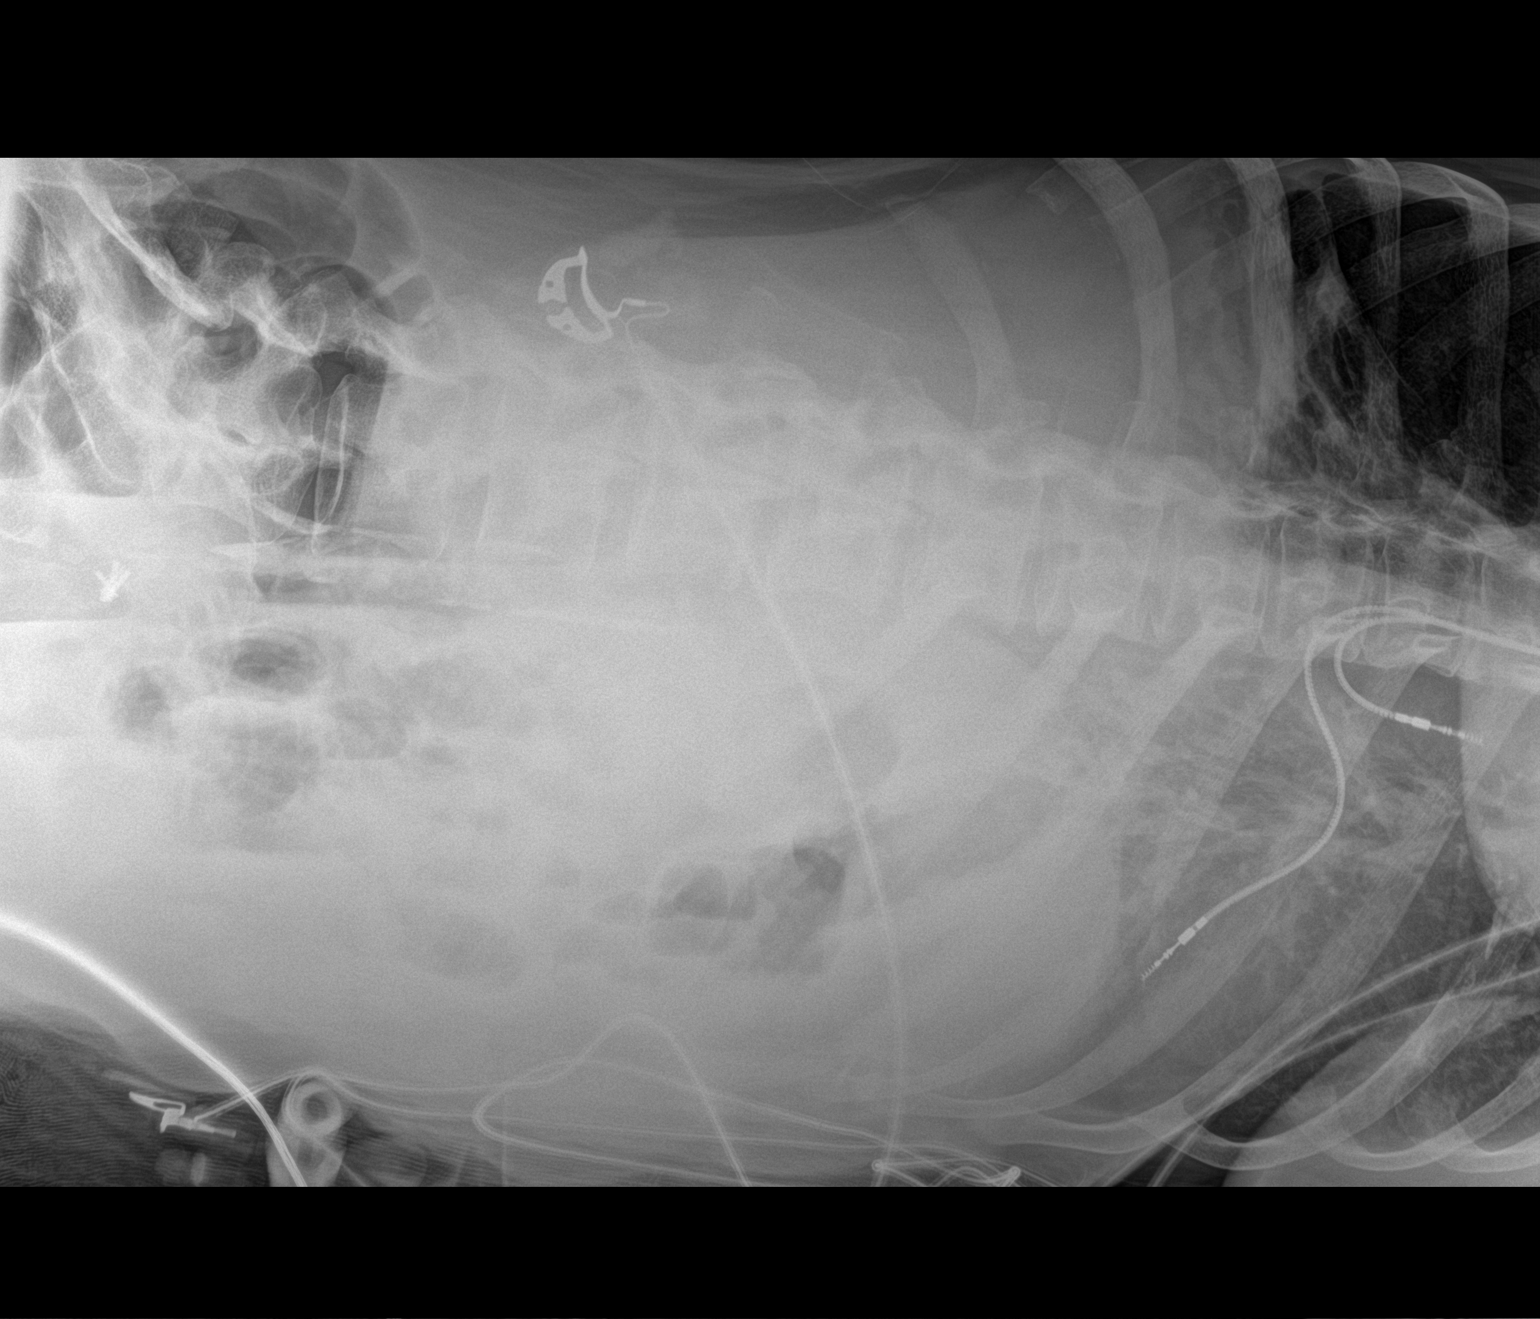

[1 of 1 positions shown; findings below may reference images not displayed]

FINDINGS: No evidence of free intraperitoneal air on the LATERAL decubitus
image. Properitoneal fat striped noted adjacent to the liver edge.
Air-fluid levels in the multiple mildly distended loops of small
bowel.

Streaky opacities in the visualized RIGHT lung base.
IMPRESSION: 1. No evidence of free intraperitoneal air. Multiple mildly
distended loops of small bowel with air-fluid levels likely
indicating partial small bowel obstruction.
2. Atelectasis versus bronchopneumonia involving the visualized
RIGHT lung base.

## 2020-03-06 IMAGING — DX DG CHEST 1V PORT
1 series · 1 of 1 positions shown · non-contrast
Comparison: None.

CLINICAL DATA: Shortness of breath.

EXAM:
PORTABLE CHEST 1 VIEW

[chest ap]
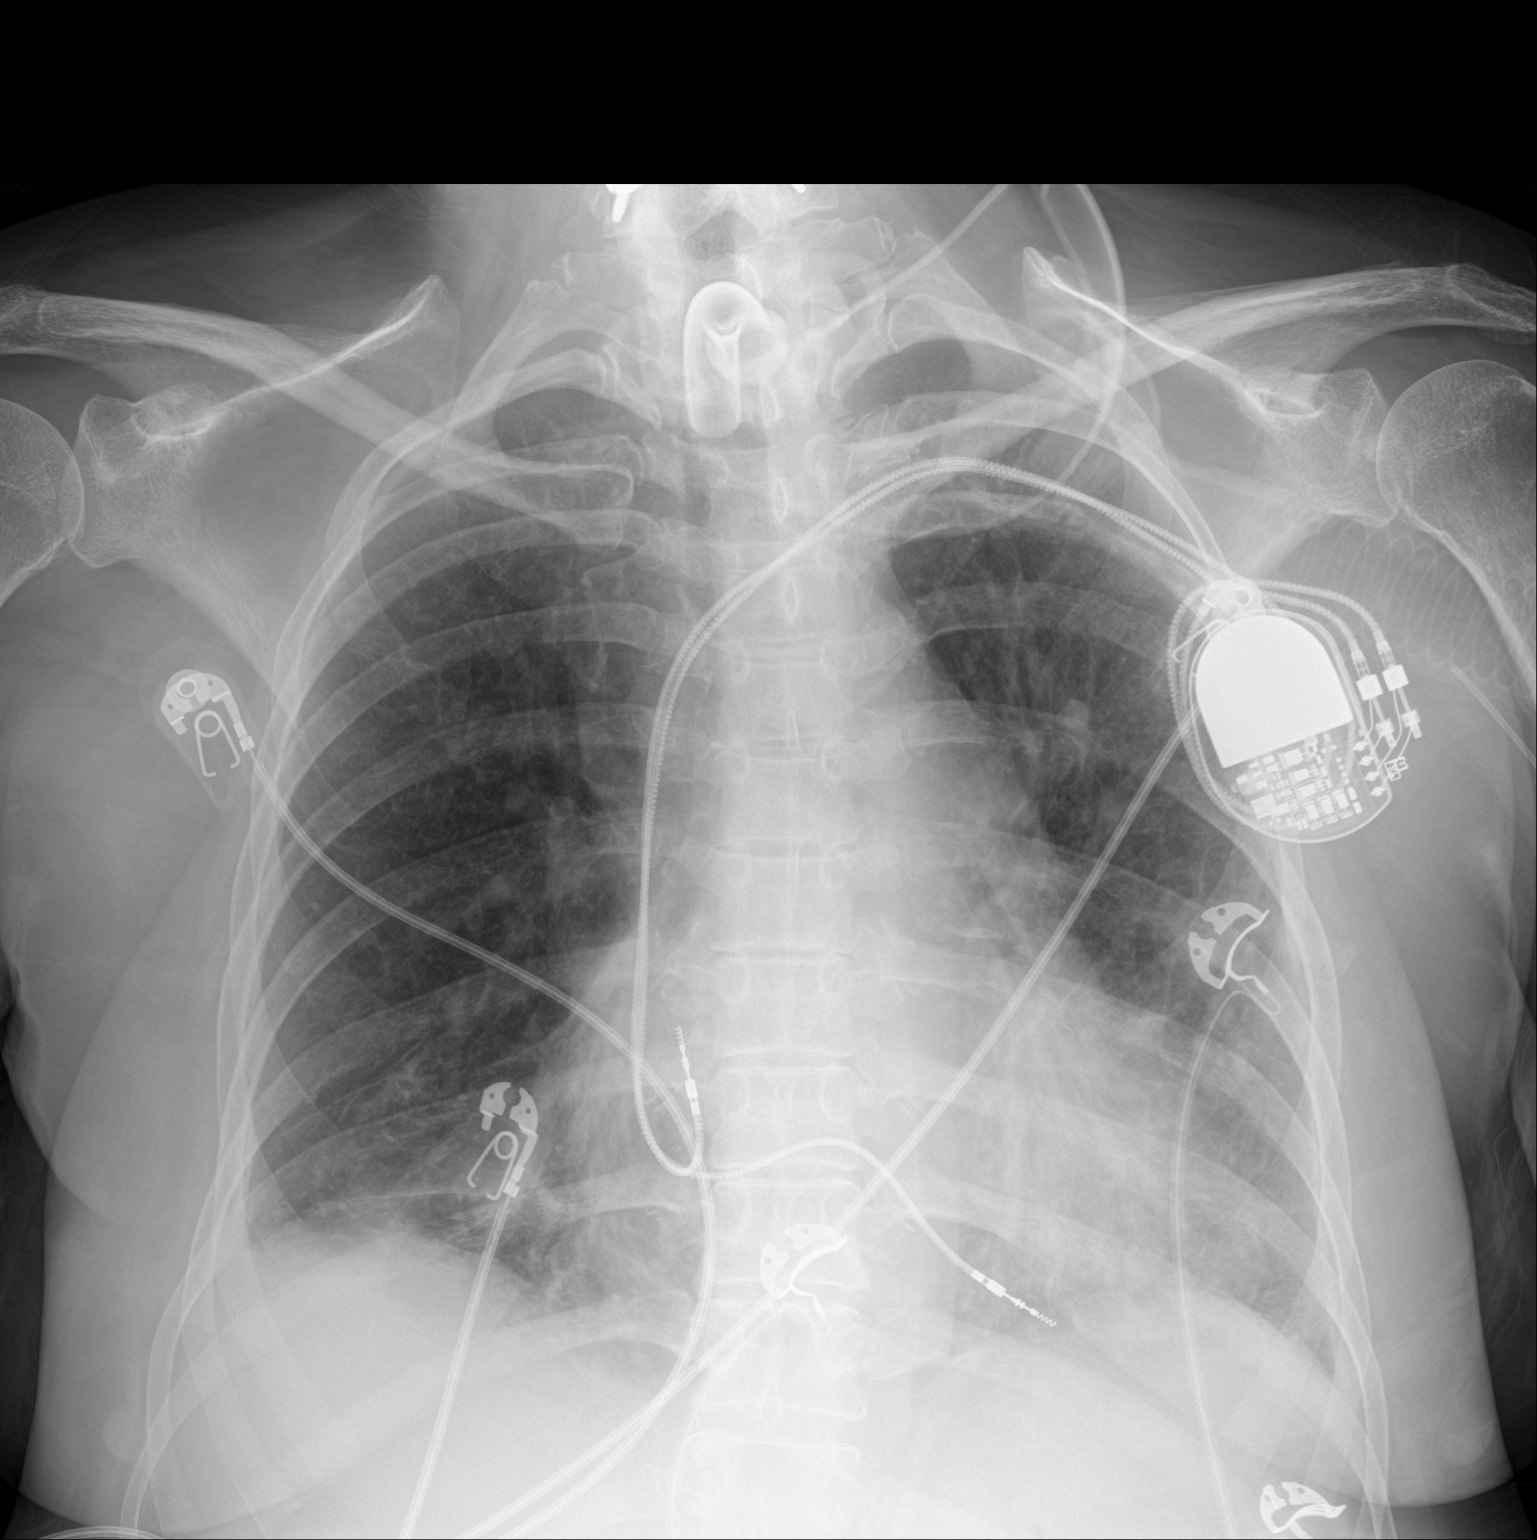

[1 of 1 positions shown; findings below may reference images not displayed]

FINDINGS: 1696 hours. The cardio pericardial silhouette is enlarged. There is
pulmonary vascular congestion without overt pulmonary edema. Similar
appearance of the bibasilar atelectasis or infiltrate. Permanent
pacemaker noted. Tracheostomy tube evident. Telemetry leads overlie
the chest.
IMPRESSION: Stable exam. Cardiomegaly with vascular congestion and bibasilar
atelectasis or infiltrate.

## 2020-03-20 IMAGING — CT CT HEAD W/O CM
4 series · 17 of 47 positions shown, 19 images · non-contrast
Comparison: Head CT dated 12/21/2017.

CLINICAL DATA: Altered level of consciousness.

EXAM:
CT HEAD WITHOUT CONTRAST
TECHNIQUE: Contiguous axial images were obtained from the base of the skull
through the vertex without intravenous contrast.

[Series 3: head without · axial · non-contrast · 0.46mm/px · z∈[-105,+25]mm · 7 of 36 slices shown, 9 images]
[im 5/36  brain]
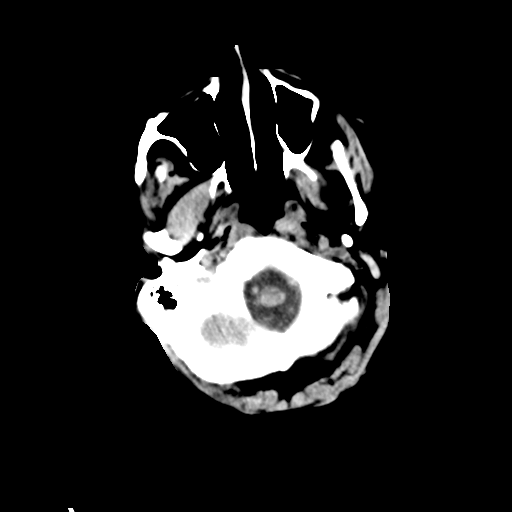
[im 5/36  bone]
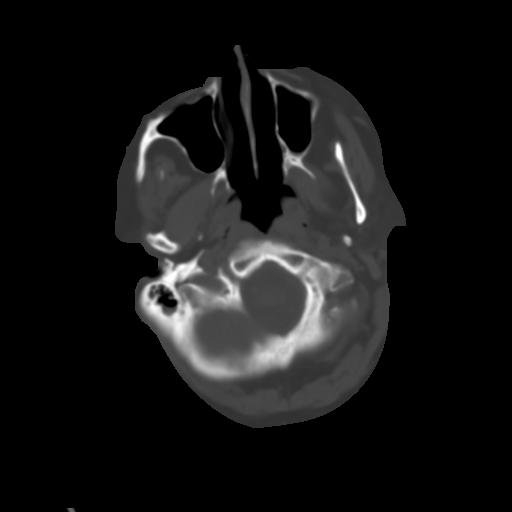
[im 9/36  brain]
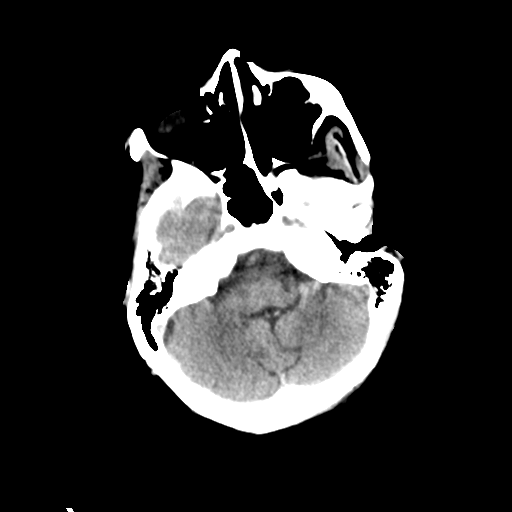
[im 14/36  brain]
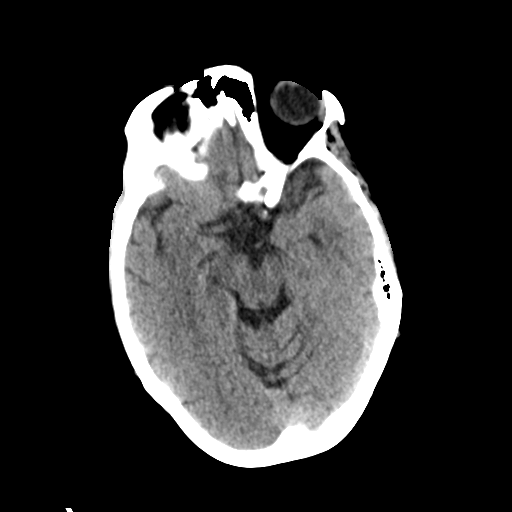
[im 18/36  brain]
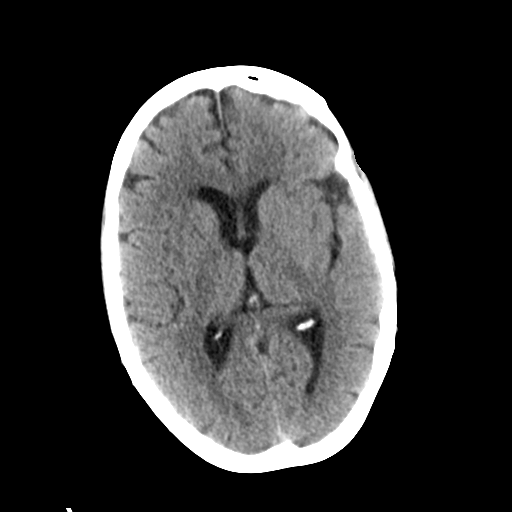
[im 22/36  brain]
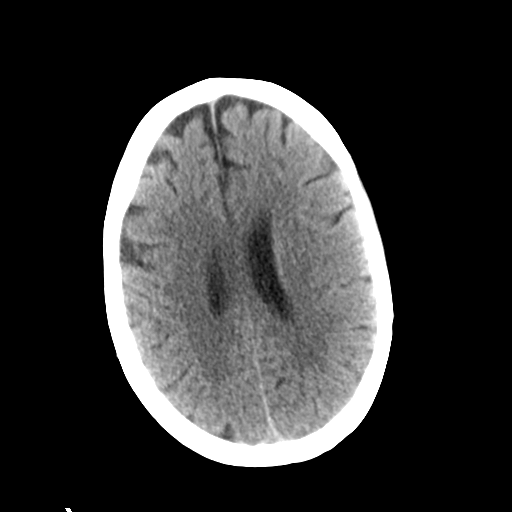
[im 22/36  bone]
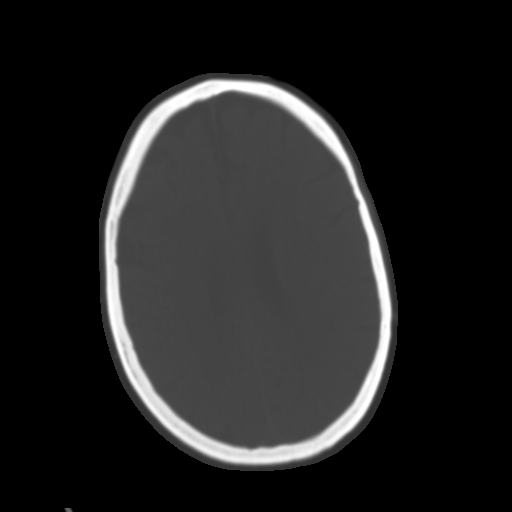
[im 27/36  brain]
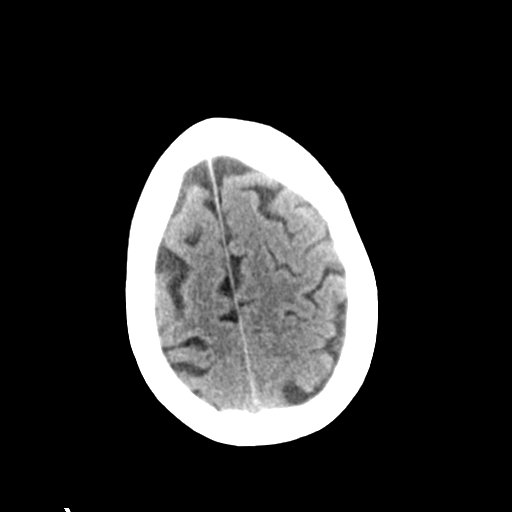
[im 31/36  brain]
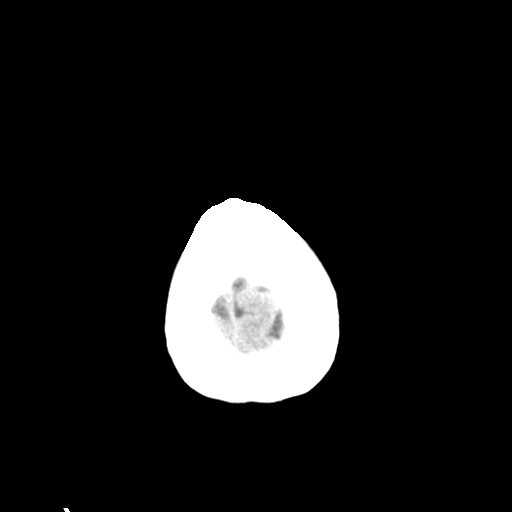

[Series 4: head bone · axial · 0.46mm/px · z∈[-109,-47]mm · 4 of 89 slices shown]
[im 9/89  bone]
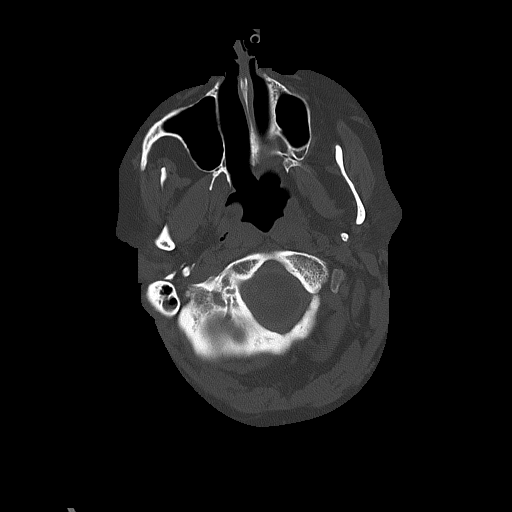
[im 18/89  bone]
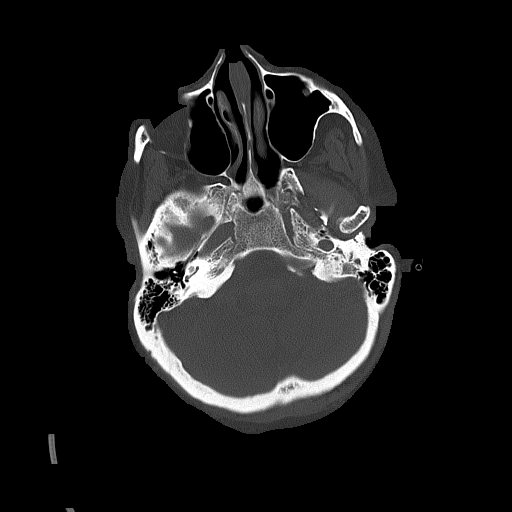
[im 27/89  bone]
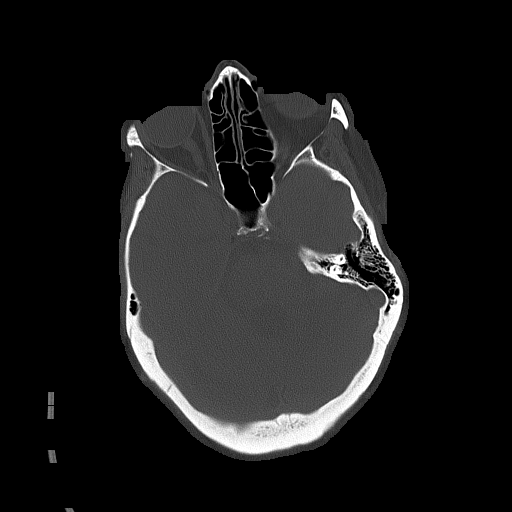
[im 40/89  bone]
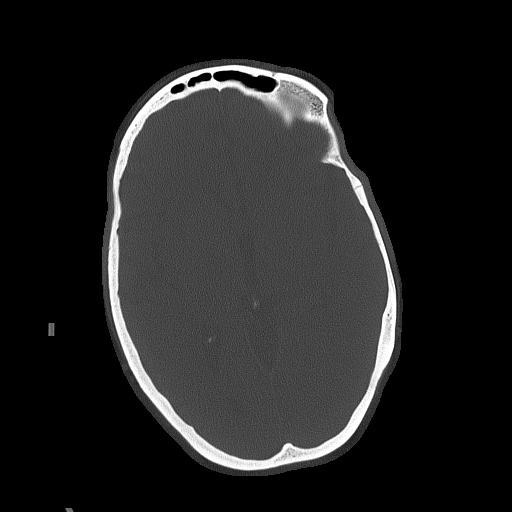

[Series 5: head without cor · coronal · non-contrast · 0.34mm/px · 3 of 76 slices shown]
[im 26/76  brain]
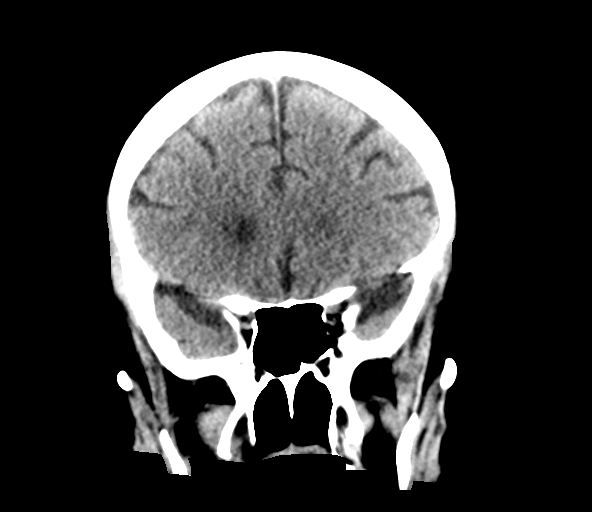
[im 34/76  brain]
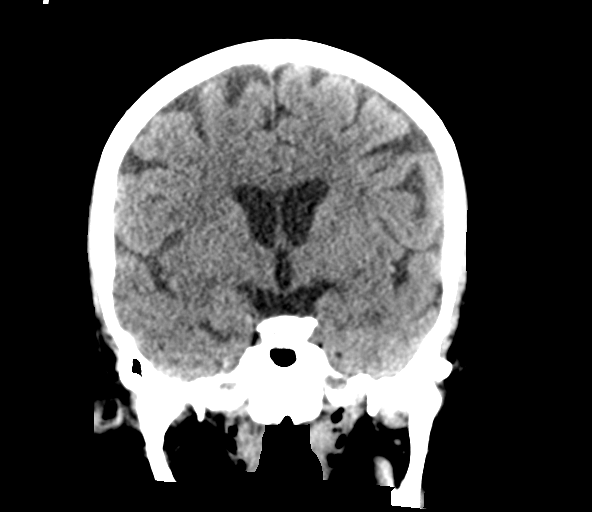
[im 42/76  brain]
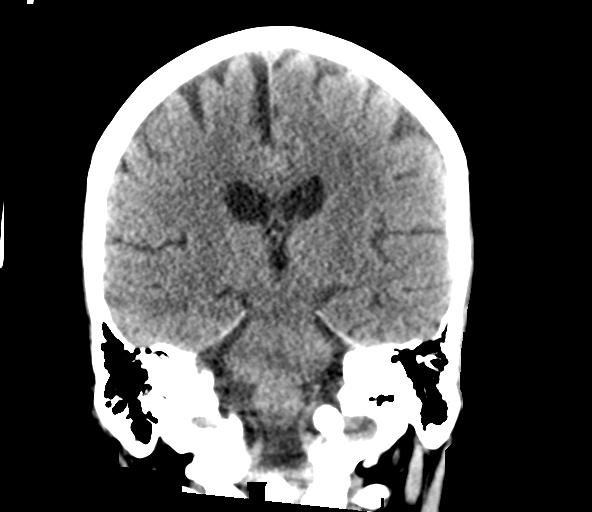

[Series 6: head without sag · sagittal · non-contrast · 0.40mm/px · 3 of 67 slices shown]
[im 28/67  brain]
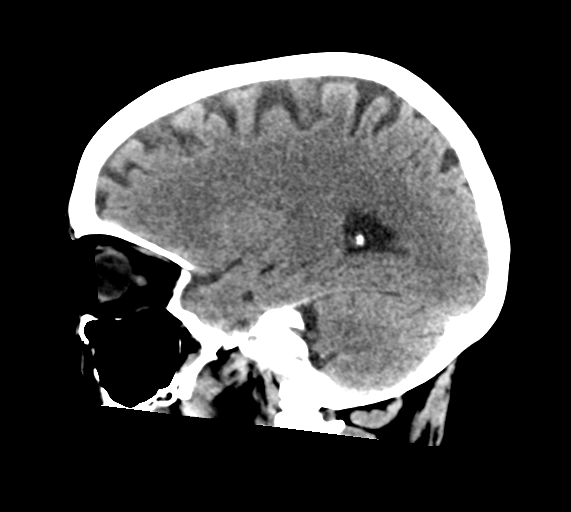
[im 34/67  brain]
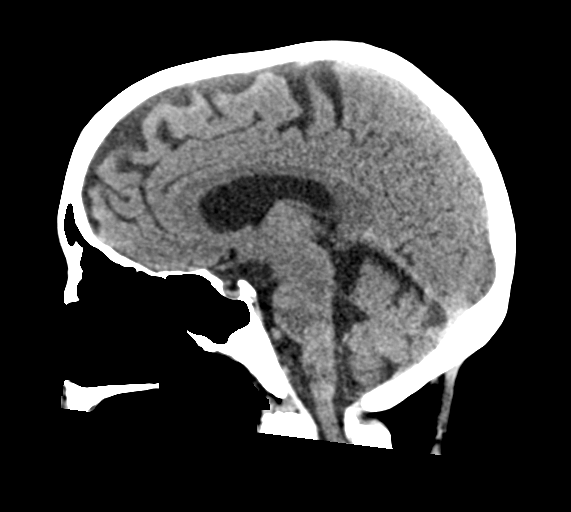
[im 39/67  brain]
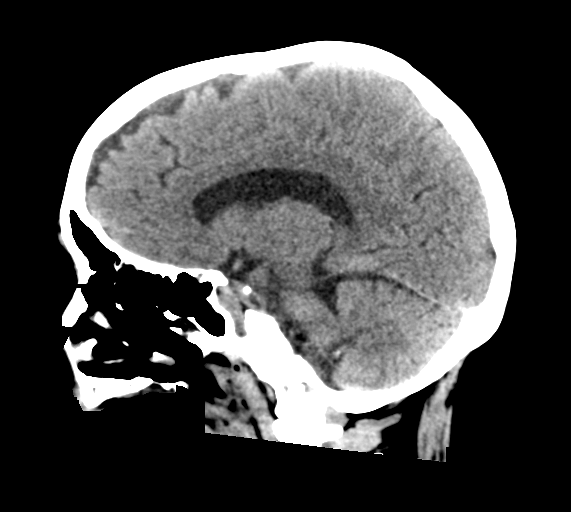

[17 of 47 positions shown; findings below may reference images not displayed]

FINDINGS: Brain: Ventricles are stable in size and configuration. There is no
mass, hemorrhage, edema or other evidence of acute parenchymal
abnormality. No extra-axial hemorrhage.

Vascular: No hyperdense vessel or unexpected calcification.

Skull: Normal. Negative for fracture or focal lesion.

Sinuses/Orbits: No acute finding.

Other: None.
IMPRESSION: Negative head CT. No intracranial mass, hemorrhage or edema.

## 2020-03-20 IMAGING — DX DG CHEST 1V PORT
1 series · 1 of 1 positions shown · non-contrast
Comparison: 01/31/2018

CLINICAL DATA: Altered mental status.

EXAM:
PORTABLE CHEST 1 VIEW

[chest]
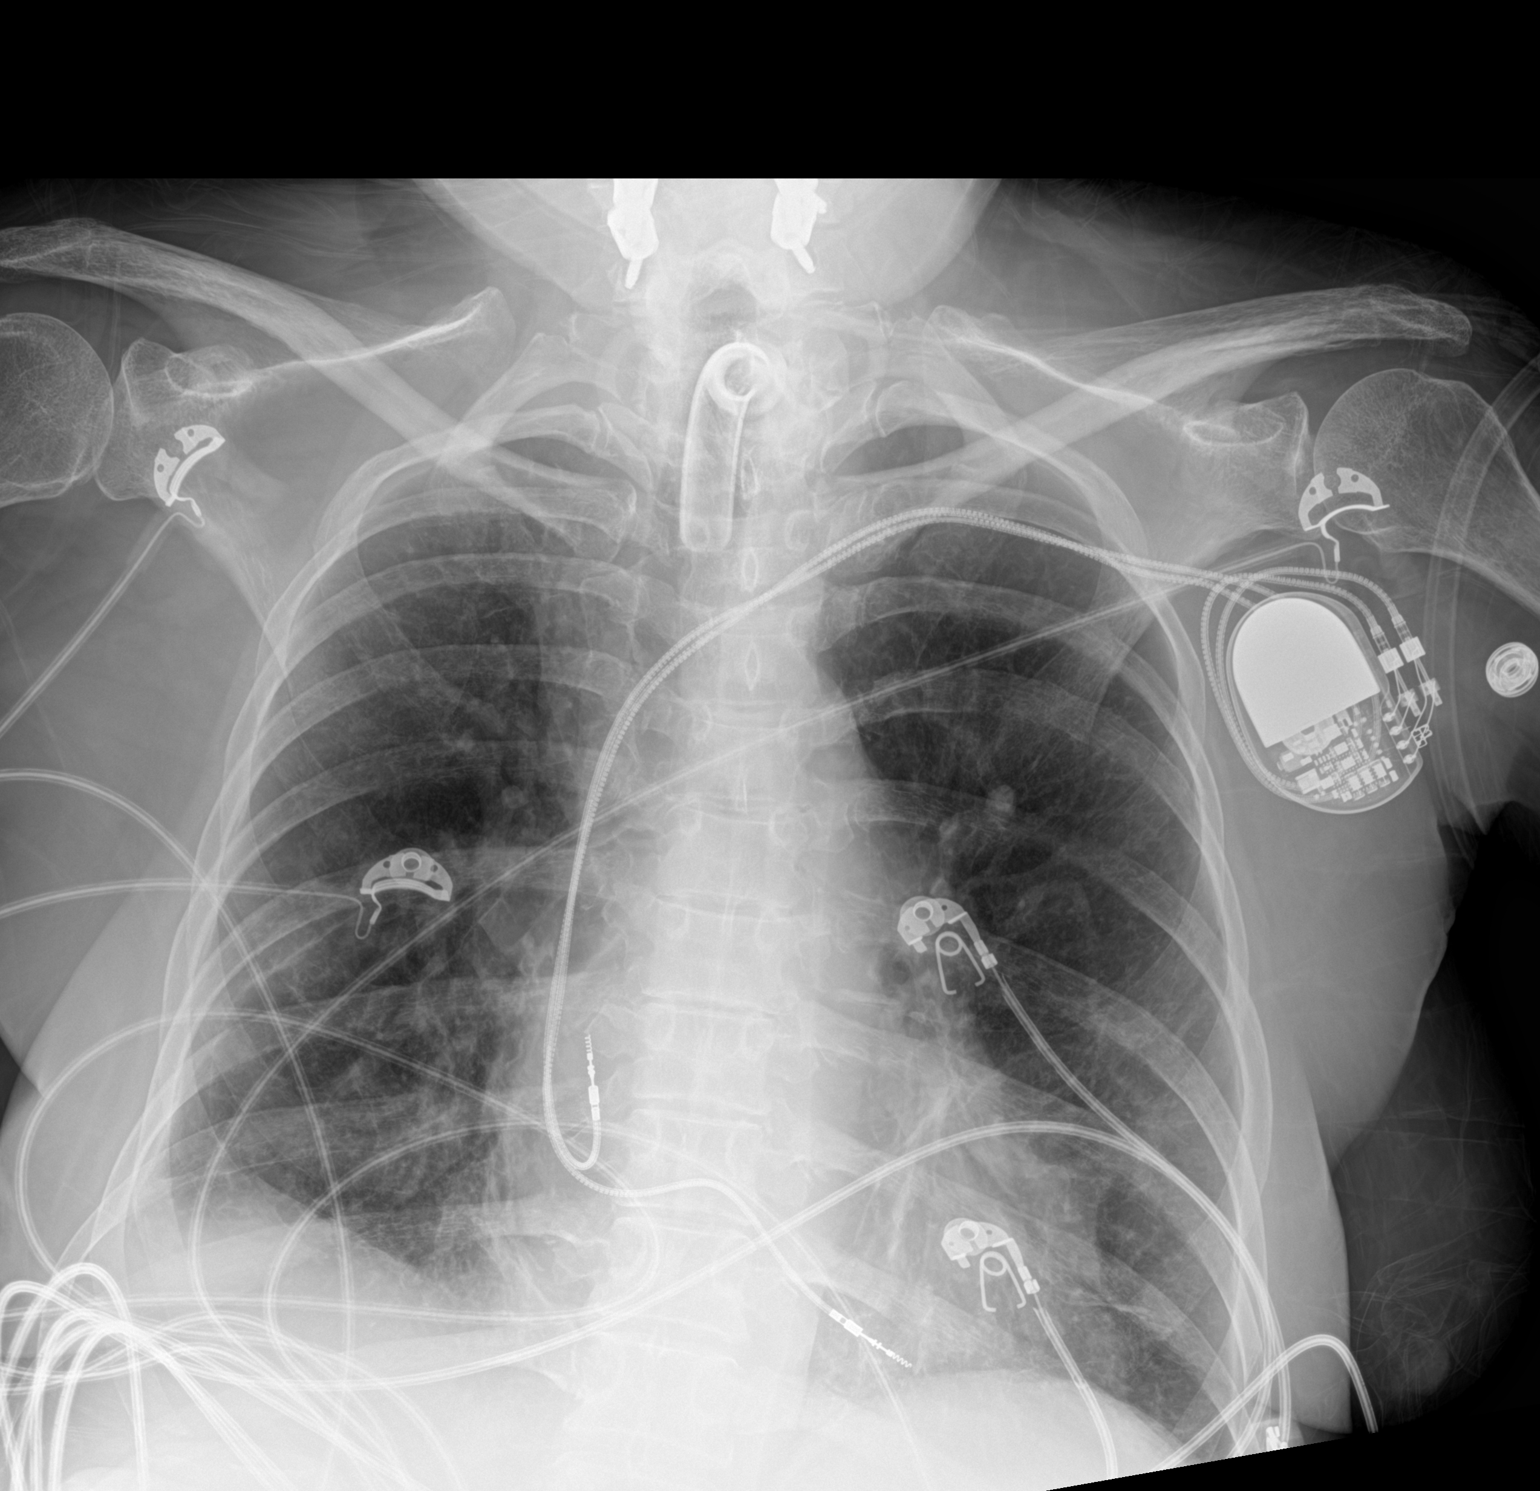

[1 of 1 positions shown; findings below may reference images not displayed]

FINDINGS: There is a left chest wall pacer device with leads in the right
atrial appendage and right ventricle. Tracheostomy tube tip is above
the carina. Normal heart size. No pleural effusion or edema.
Pulmonary vascular congestion is again noted. No airspace opacities.
IMPRESSION: 1. No CHF or pneumonia.

## 2020-05-09 IMAGING — DX PORTABLE ABDOMEN - 1 VIEW
1 series · 1 of 1 positions shown · non-contrast
Comparison: 12/31/17

CLINICAL DATA: Check gastric catheter

EXAM:
PORTABLE ABDOMEN - 1 VIEW

[abdomen kub]
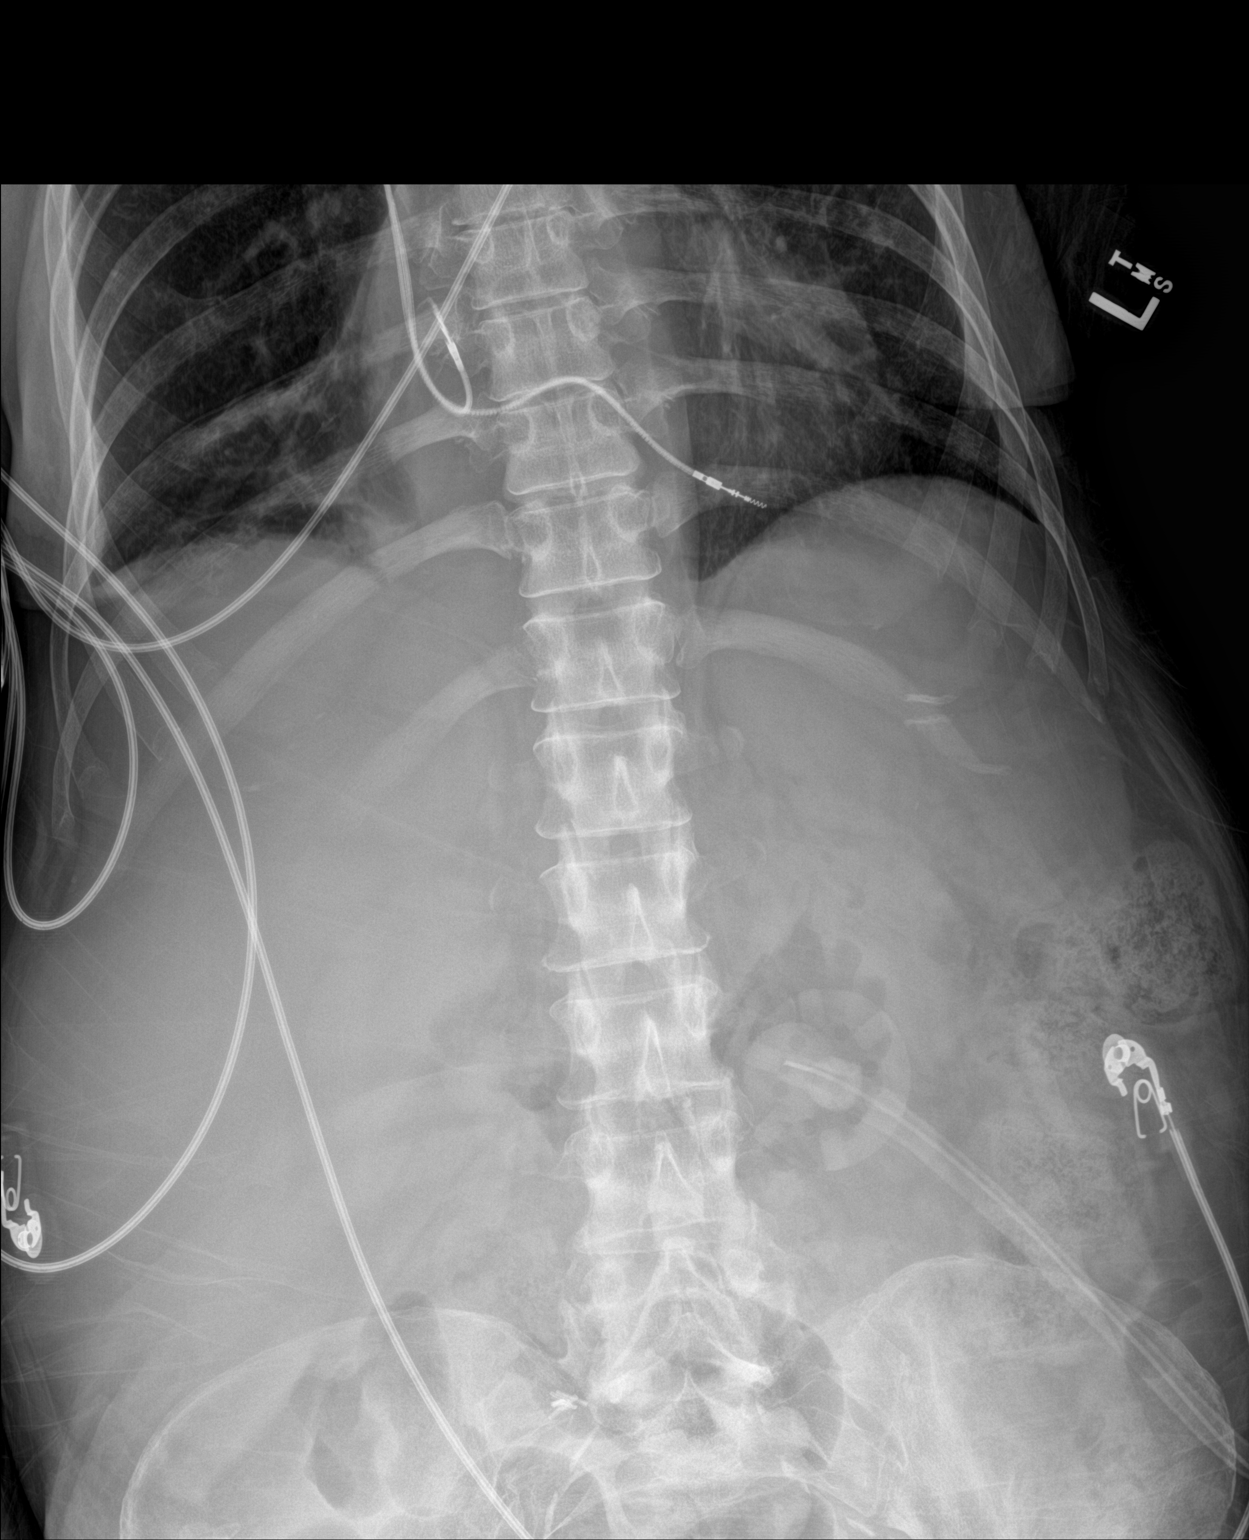

[1 of 1 positions shown; findings below may reference images not displayed]

FINDINGS: Scattered large and small bowel gas is noted. Gastrostomy catheter
is noted within the gastric lumen. No other focal abnormality is
noted.
IMPRESSION: Gastrostomy catheter within the stomach.

## 2020-05-09 IMAGING — DX PORTABLE CHEST - 1 VIEW
1 series · 1 of 1 positions shown · non-contrast
Comparison: 02/14/2018

CLINICAL DATA: Cough

EXAM:
PORTABLE CHEST 1 VIEW

[chest]
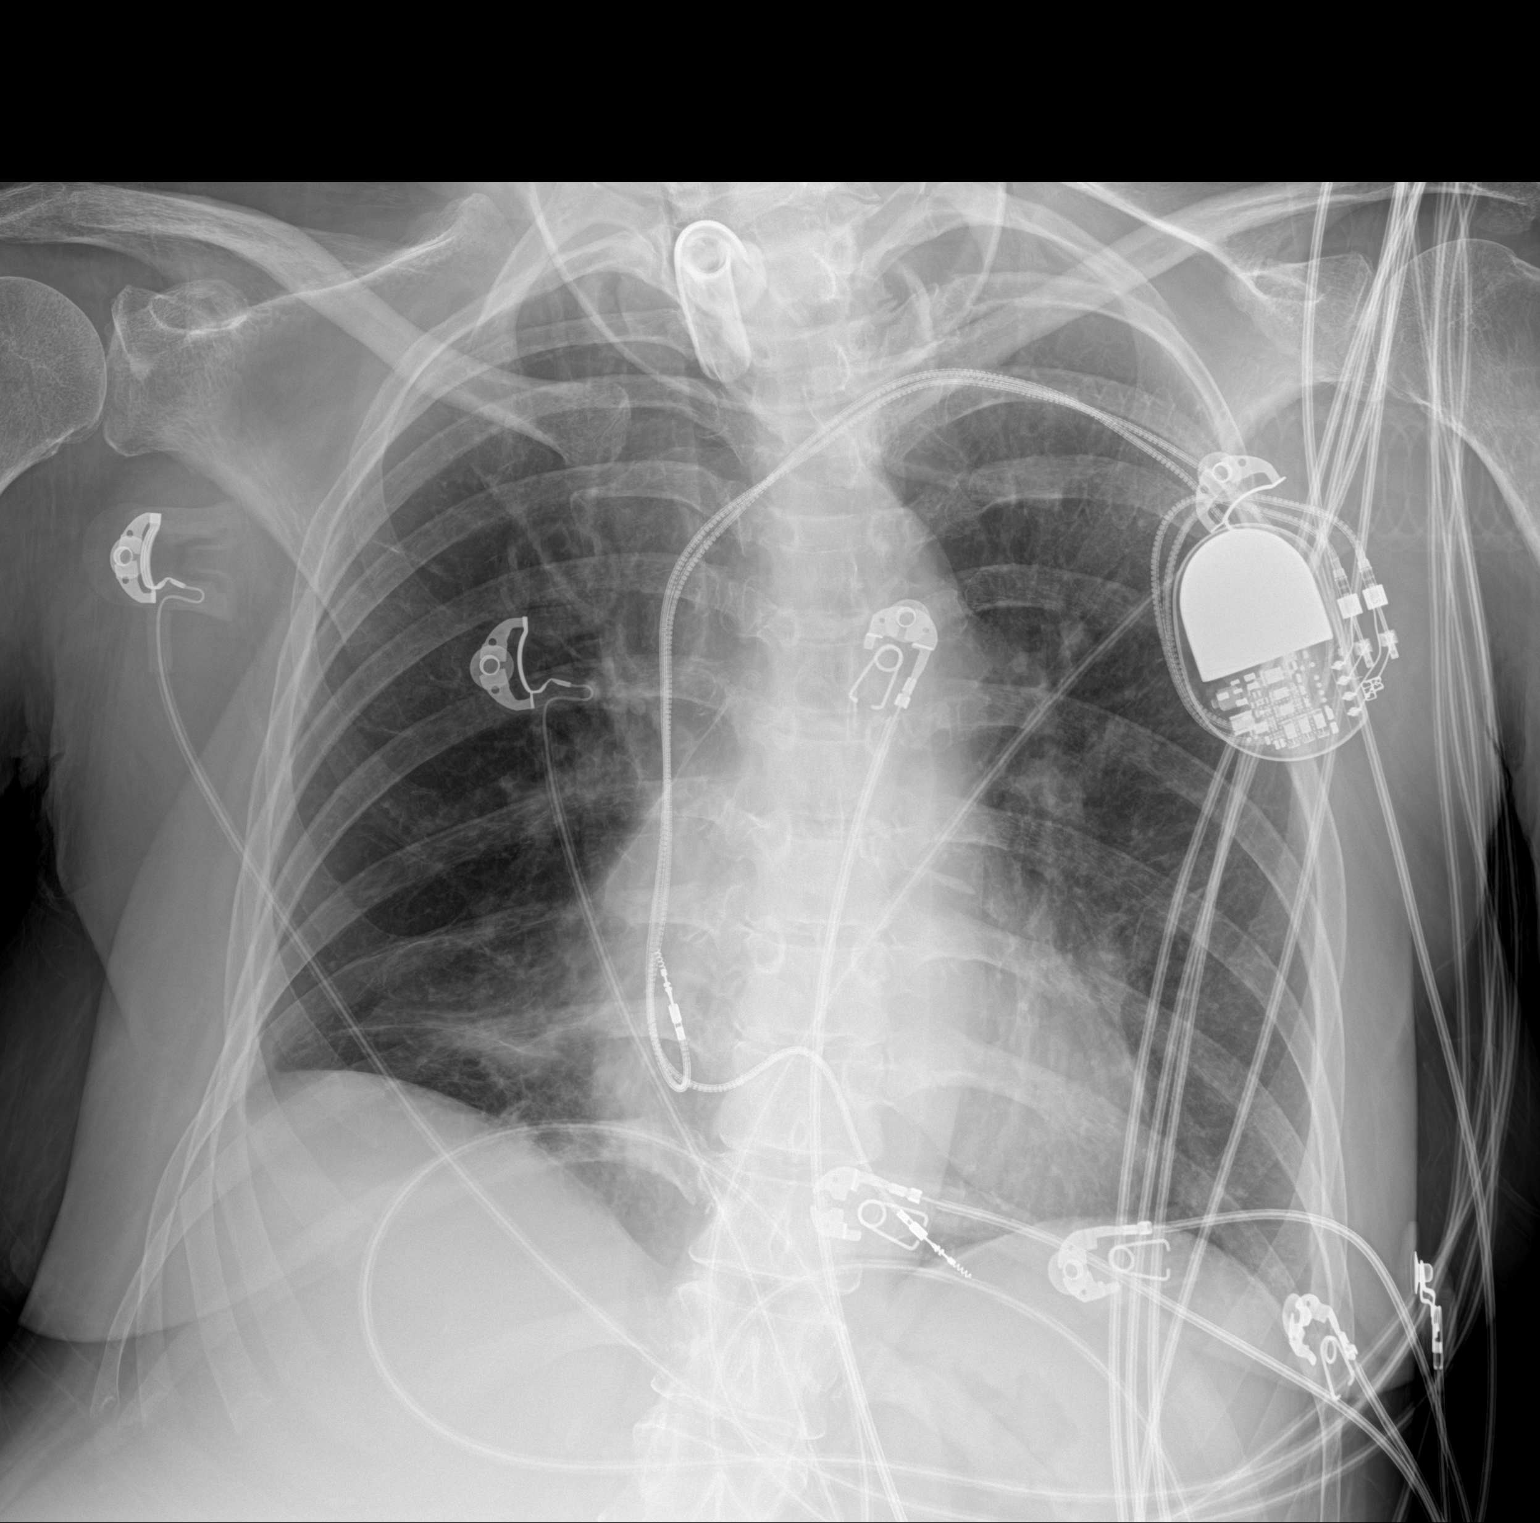

[1 of 1 positions shown; findings below may reference images not displayed]

FINDINGS: Unchanged position of left chest wall pacemaker leads. There is mild
opacity at the right lung base. No pleural effusion or pneumothorax.
Tracheostomy tube tip is just above the level of the clavicular
heads.
IMPRESSION: Mild right basilar opacity, likely atelectasis.

## 2020-05-09 IMAGING — DX PORTABLE CHEST - 1 VIEW
1 series · 1 of 1 positions shown · non-contrast
Comparison: Chest x-ray from same day at [DATE] a.m.

CLINICAL DATA: Respiratory failure.

EXAM:
PORTABLE CHEST 1 VIEW

[chest]
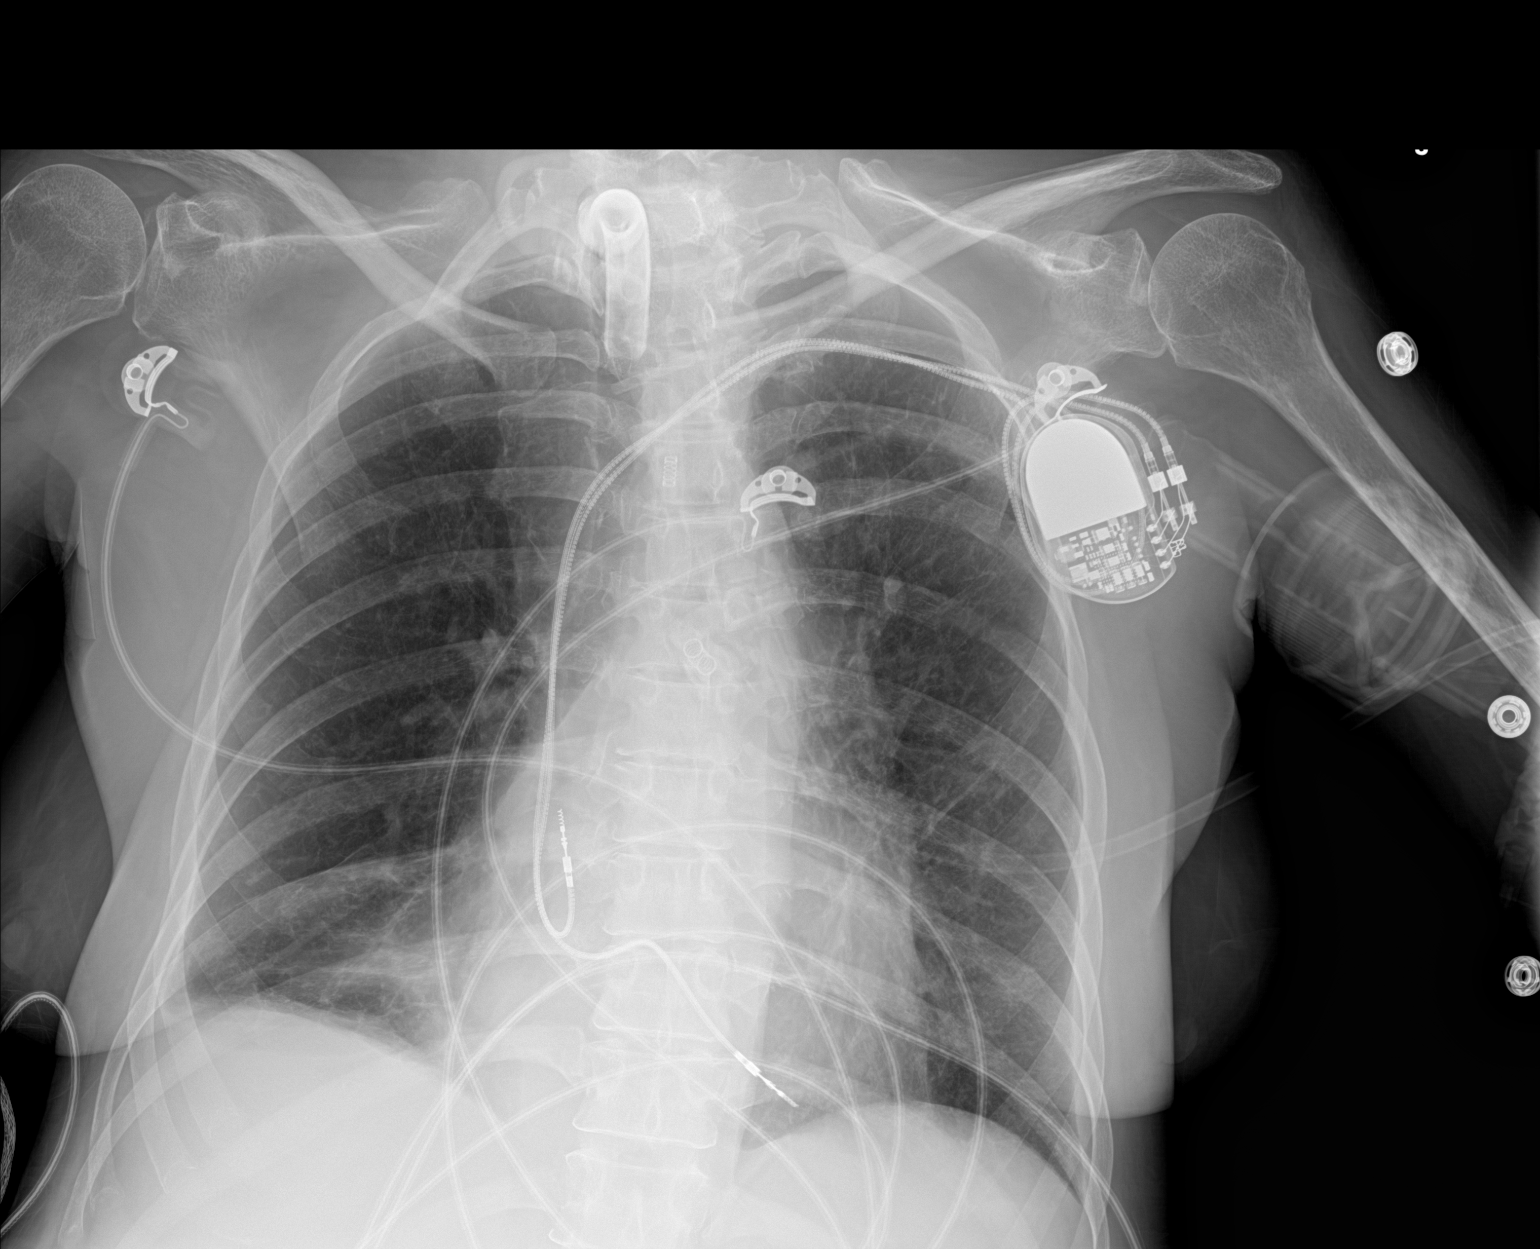

[1 of 1 positions shown; findings below may reference images not displayed]

FINDINGS: Unchanged tracheostomy tube and left chest wall pacemaker. Stable
cardiomediastinal silhouette. Normal pulmonary vascularity.
Unchanged right basilar atelectasis. No focal consolidation, pleural
effusion, or pneumothorax. No acute osseous abnormality.
IMPRESSION: 1. Unchanged right basilar atelectasis.

## 2020-05-10 IMAGING — DX PORTABLE CHEST - 1 VIEW
1 series · 1 of 1 positions shown · non-contrast
Comparison: 04/05/2018

CLINICAL DATA: Respiratory failure

EXAM:
PORTABLE CHEST 1 VIEW

[chest]
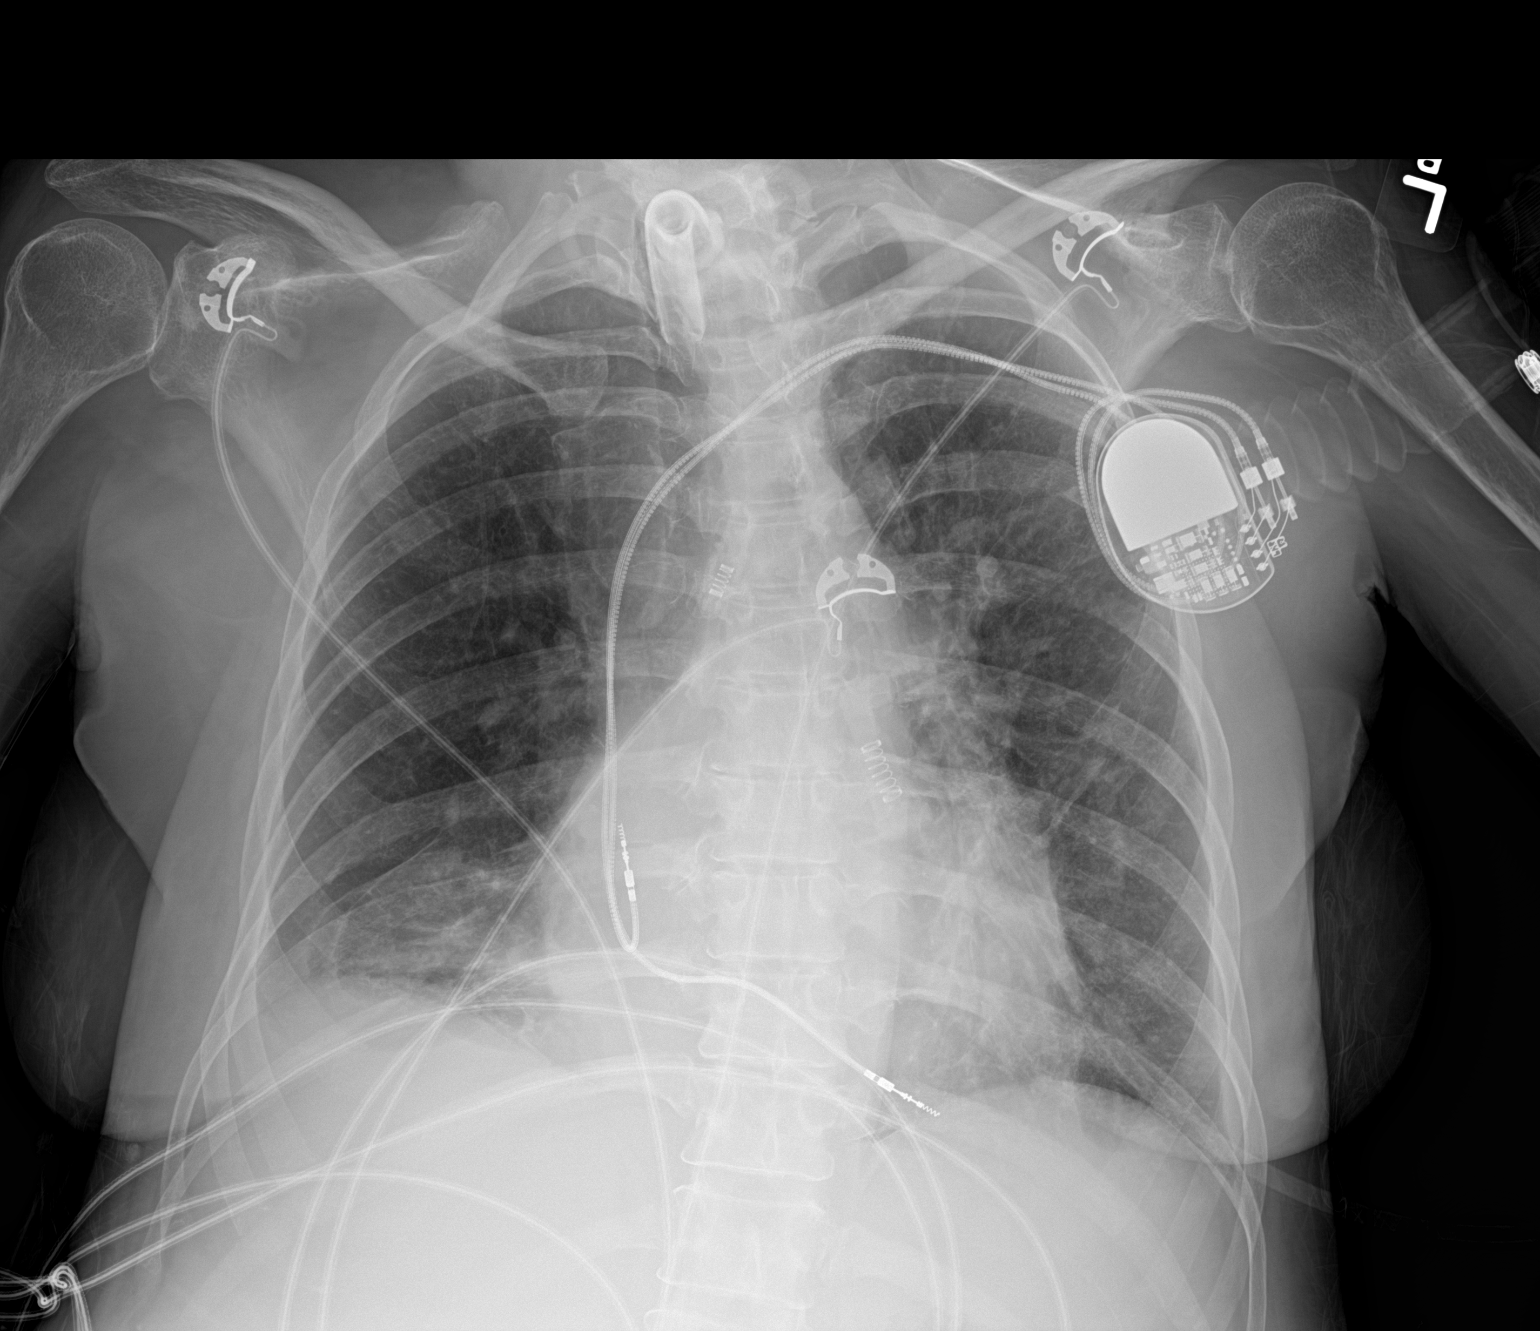

[1 of 1 positions shown; findings below may reference images not displayed]

FINDINGS: Normal heart size. Dual-chamber pacer leads from the left in stable
position. Tracheostomy tube is present. Small right pleural effusion
with adjacent lower lobe opacity. No pneumothorax.
IMPRESSION: Increased right lower lobe opacity with small pleural effusion.

## 2020-07-26 IMAGING — CT CT HEAD WITHOUT CONTRAST
4 of 5 series · 16 of 47 positions shown, 18 images · non-contrast
Comparison: 02/14/2018

CLINICAL DATA: Altered level of consciousness

EXAM:
CT HEAD WITHOUT CONTRAST
TECHNIQUE: Contiguous axial images were obtained from the base of the skull
through the vertex without intravenous contrast.

[Series 3: head wo · axial · 0.41mm/px · z∈[-139,-19]mm · 5 of 36 slices shown, 7 images (1 of 2)]
[im 6/36  brain]
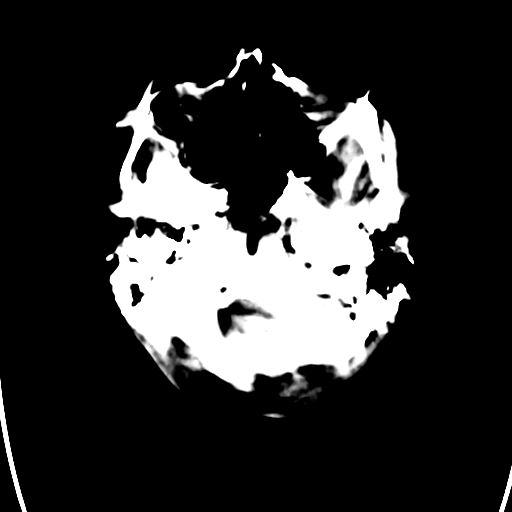
[im 6/36  bone]
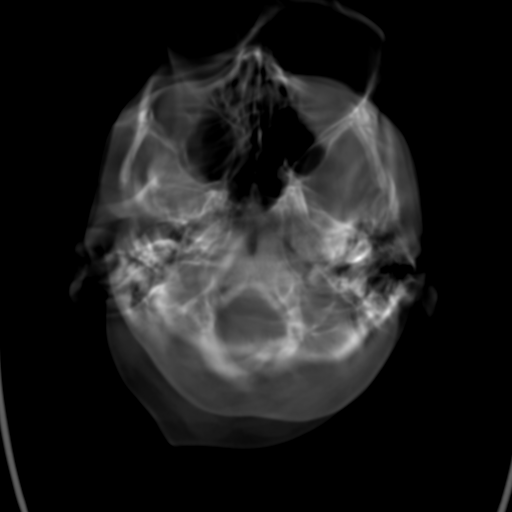
[im 12/36  brain]
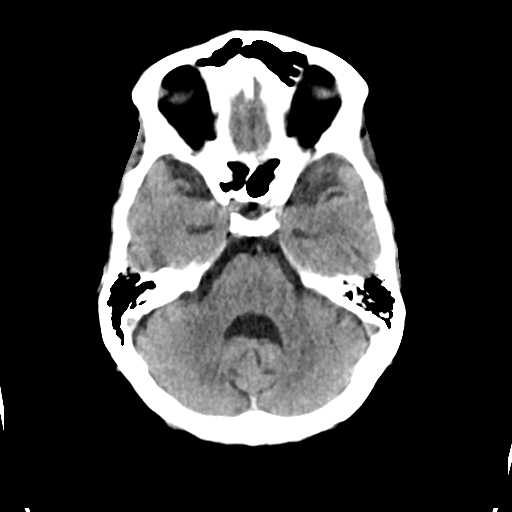
[im 18/36  brain]
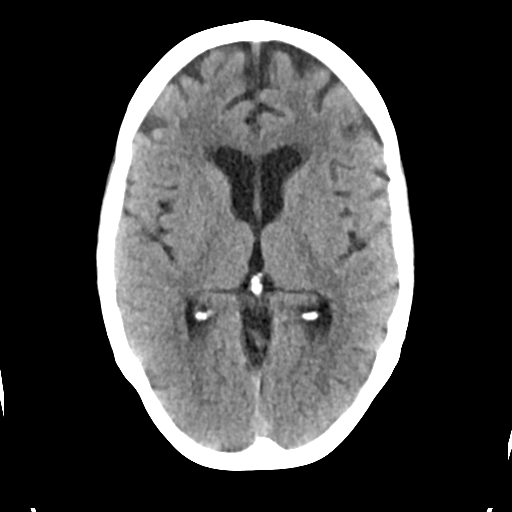
[im 24/36  brain]
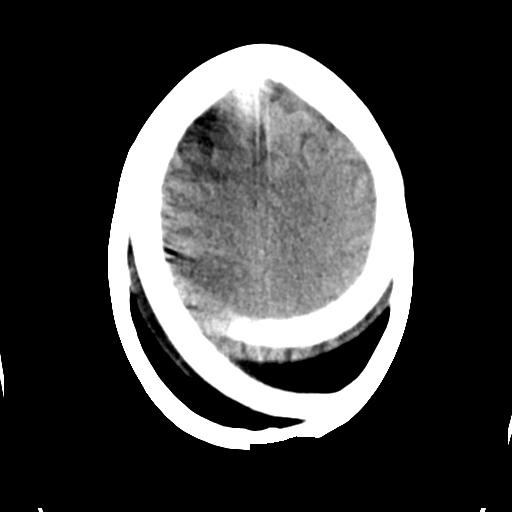
[im 30/36  brain]
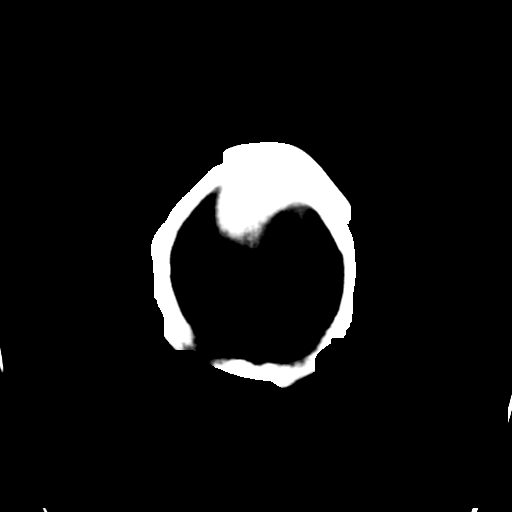
[im 30/36  bone]
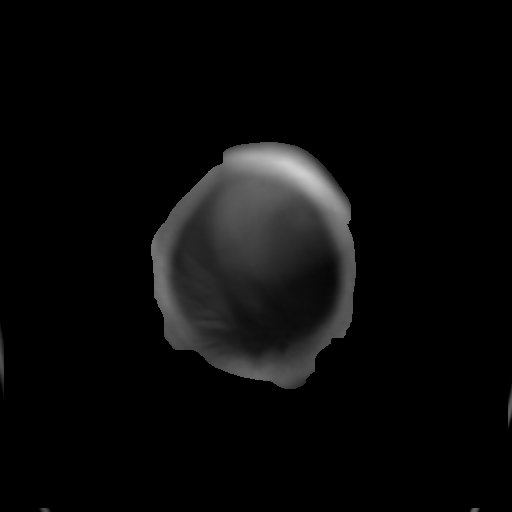

[Series 4: head wo · axial · 0.41mm/px · z∈[-139,-19]mm · 5 of 36 slices shown (2 of 2)]
[im 6/36  brain]
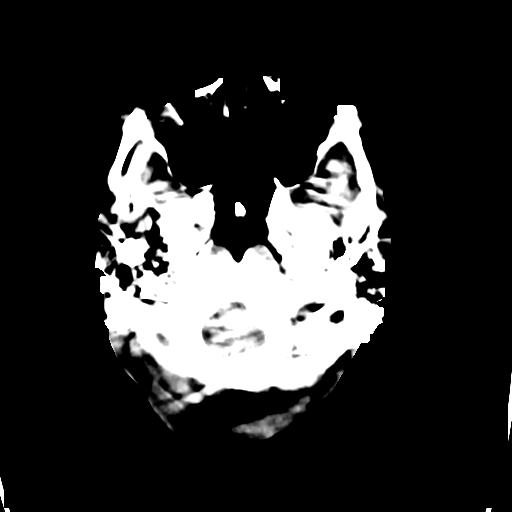
[im 12/36  brain]
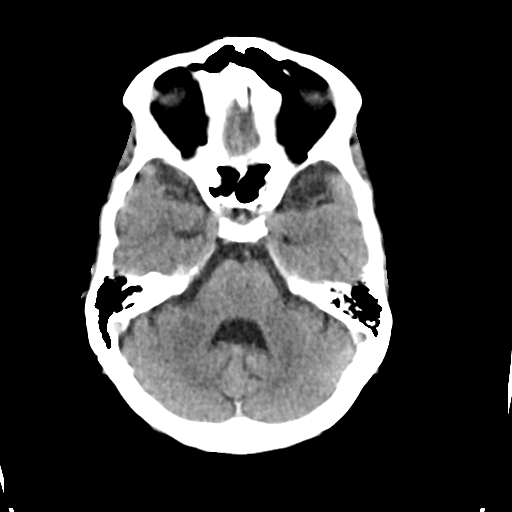
[im 18/36  brain]
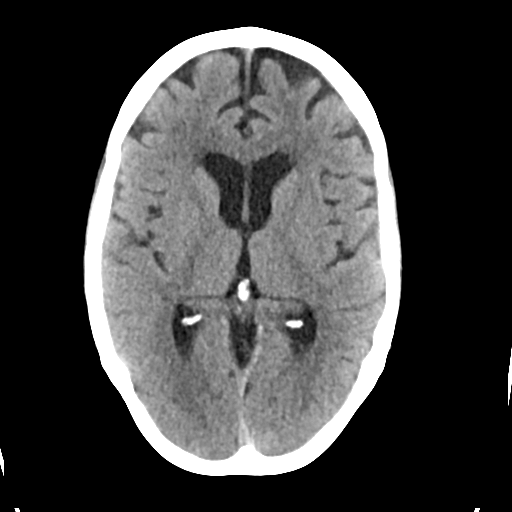
[im 24/36  brain]
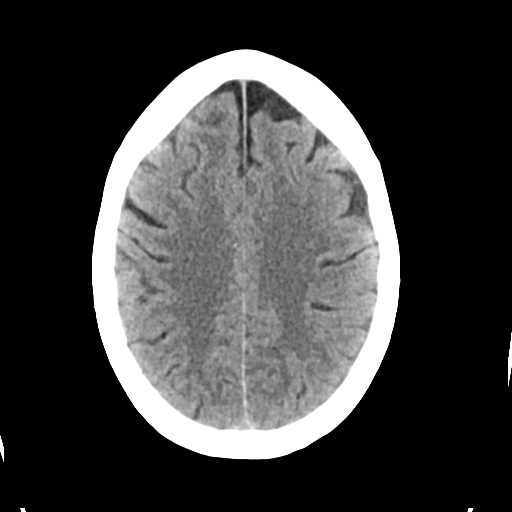
[im 30/36  brain]
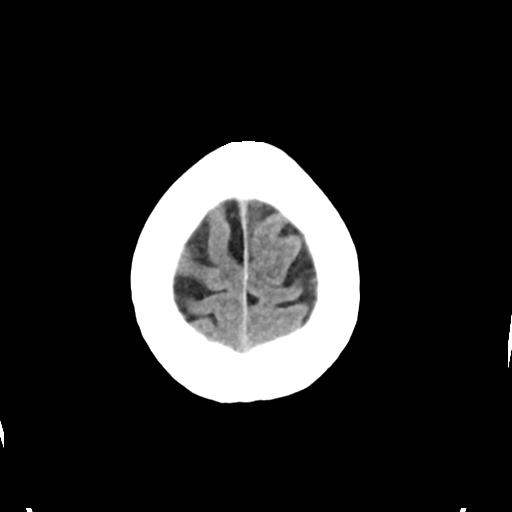

[Series 6: cor soft · coronal · 0.32mm/px · 3 of 67 slices shown]
[im 23/67  brain]
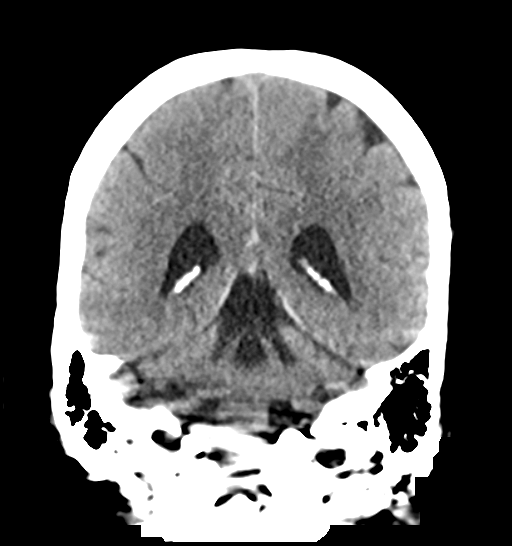
[im 30/67  brain]
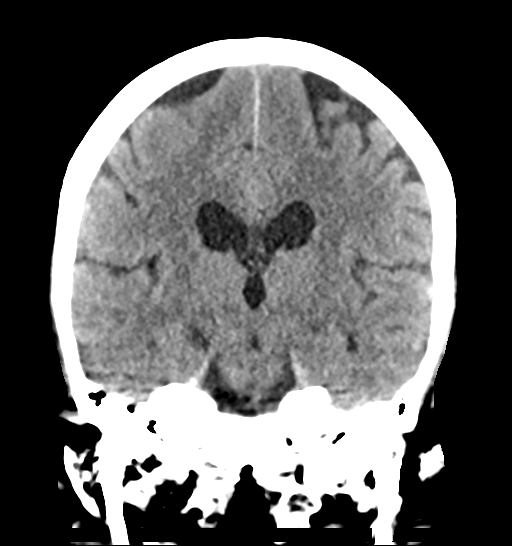
[im 37/67  brain]
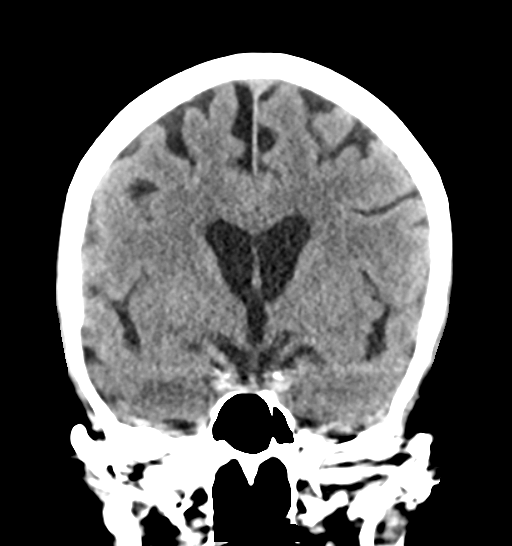

[Series 7: sag soft · sagittal · 0.35mm/px · 3 of 52 slices shown]
[im 18/52  brain]
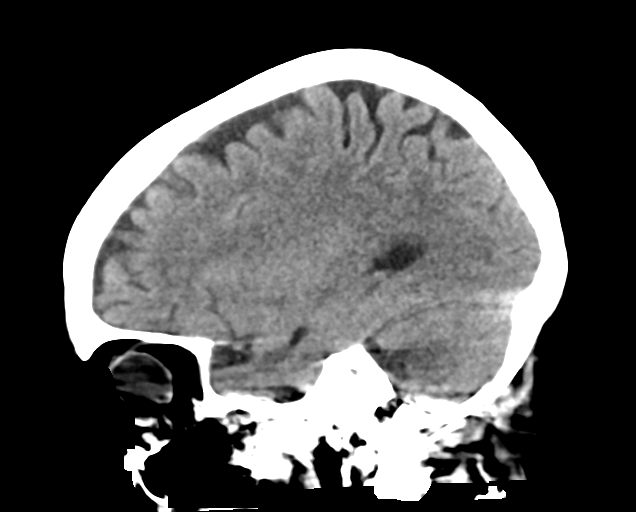
[im 26/52  brain]
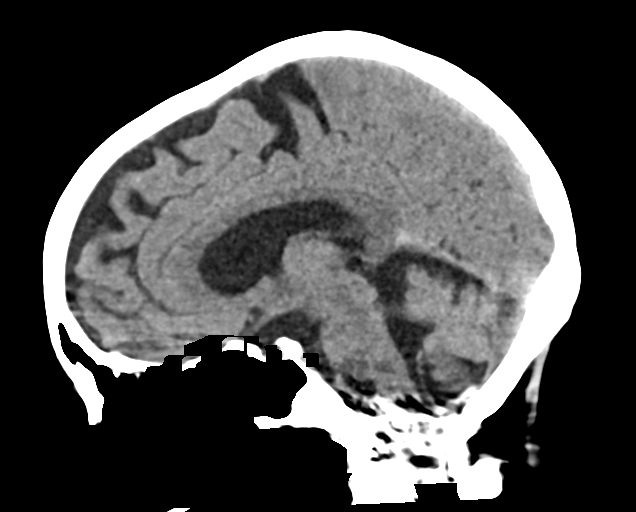
[im 35/52  brain]
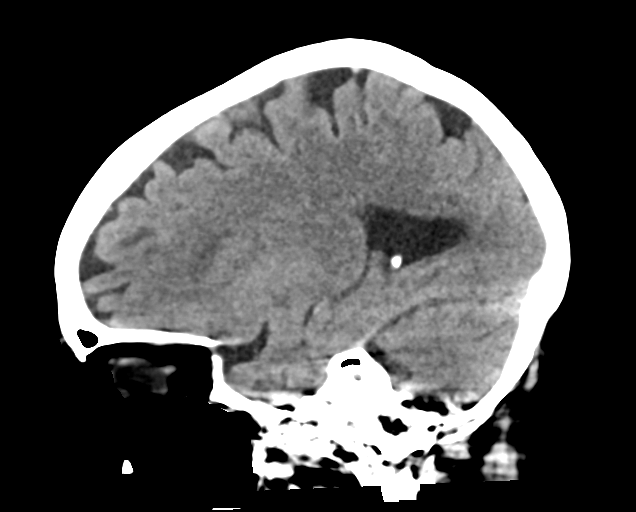

[16 of 47 positions shown; findings below may reference images not displayed]

FINDINGS: Brain: No evidence of acute infarction, hemorrhage, hydrocephalus,
extra-axial collection or mass lesion/mass effect. Mild for age
cerebral volume loss

Vascular: No hyperdense vessel or unexpected calcification.

Skull: Normal. Negative for fracture or focal lesion.

Sinuses/Orbits: Negative

Other: Motion degraded
IMPRESSION: Stable, negative head CT.

## 2020-07-26 IMAGING — DX PORTABLE CHEST - 1 VIEW
1 series · 1 of 1 positions shown · non-contrast
Comparison: 04/06/2018

CLINICAL DATA: Sepsis

EXAM:
PORTABLE CHEST 1 VIEW

[chest ap]
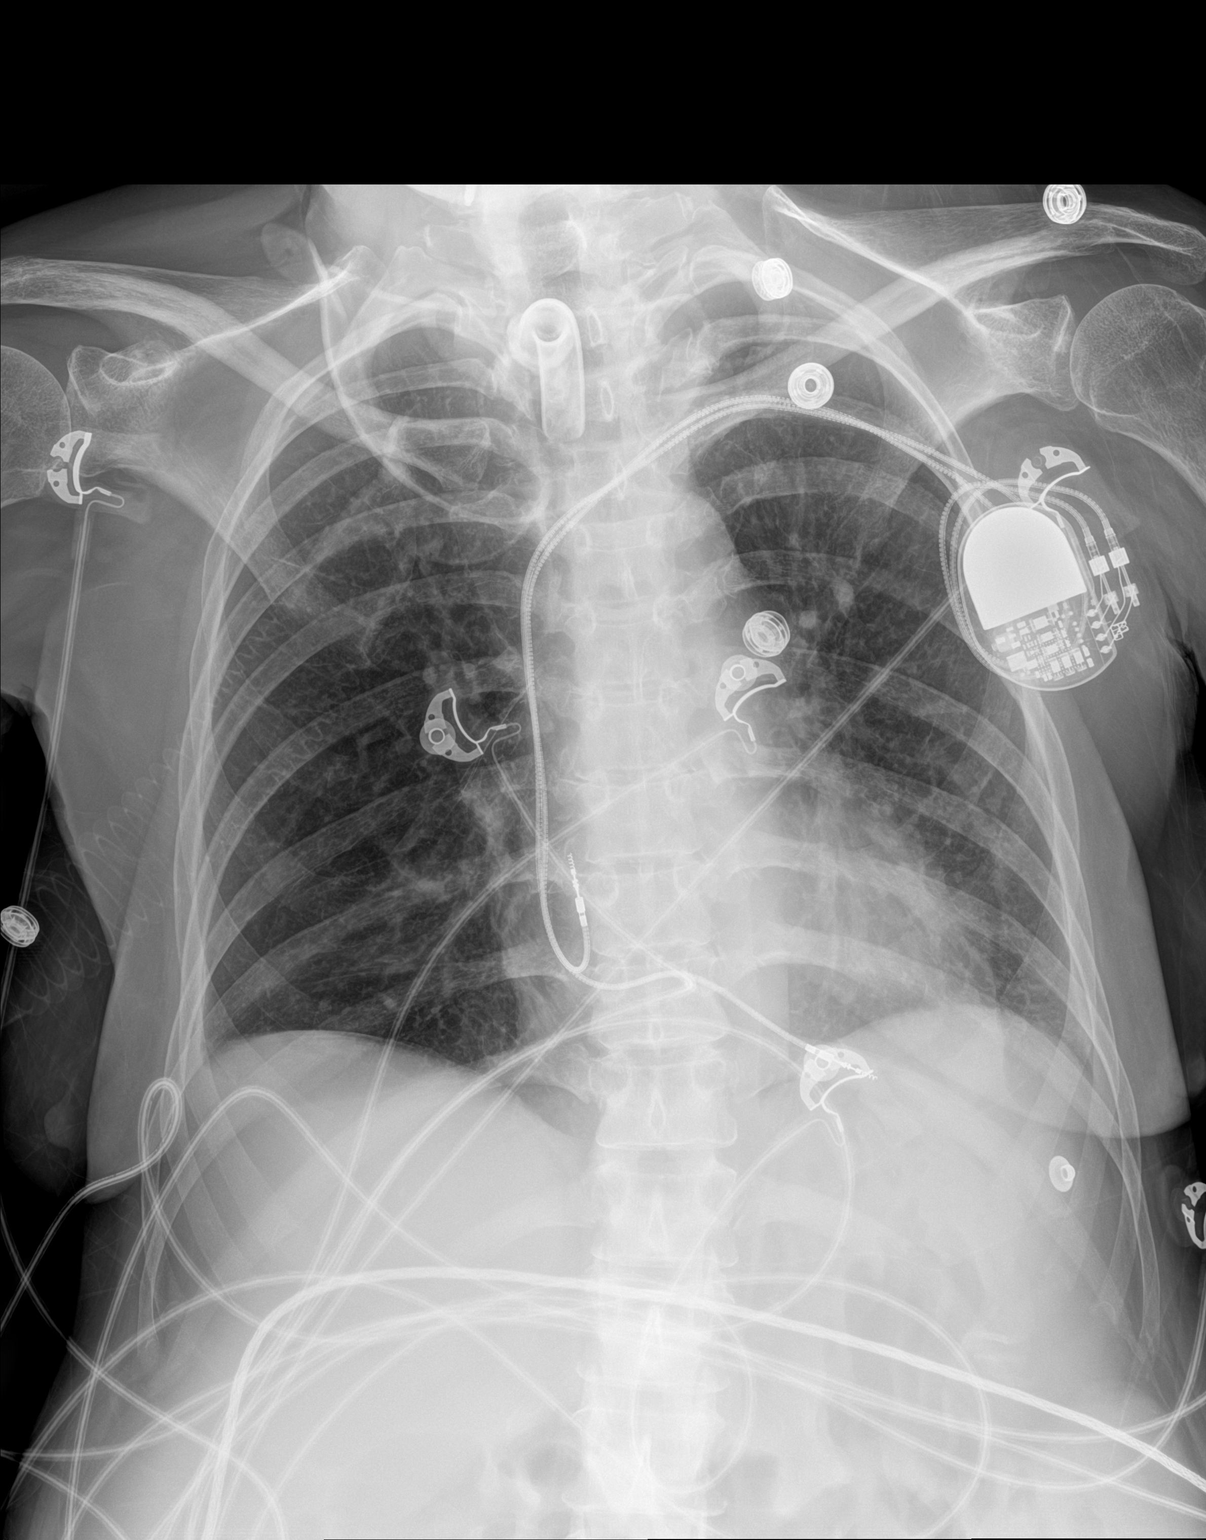

[1 of 1 positions shown; findings below may reference images not displayed]

FINDINGS: The tracheostomy tube terminates above the carina. The dual chamber
left-sided pacemaker is stable in positioning. The heart size is
stable. The lungs are clear. There is blunting of the right
costophrenic angle which has improved from prior study. There is no
pneumothorax.
IMPRESSION: No active disease.

## 2020-08-23 IMAGING — DX ABDOMEN - 1 VIEW
1 series · 1 of 1 positions shown · non-contrast
Comparison: 04/05/2018

CLINICAL DATA: Unable to flush PEG tube.

EXAM:
ABDOMEN - 1 VIEW

[abdomen kub]
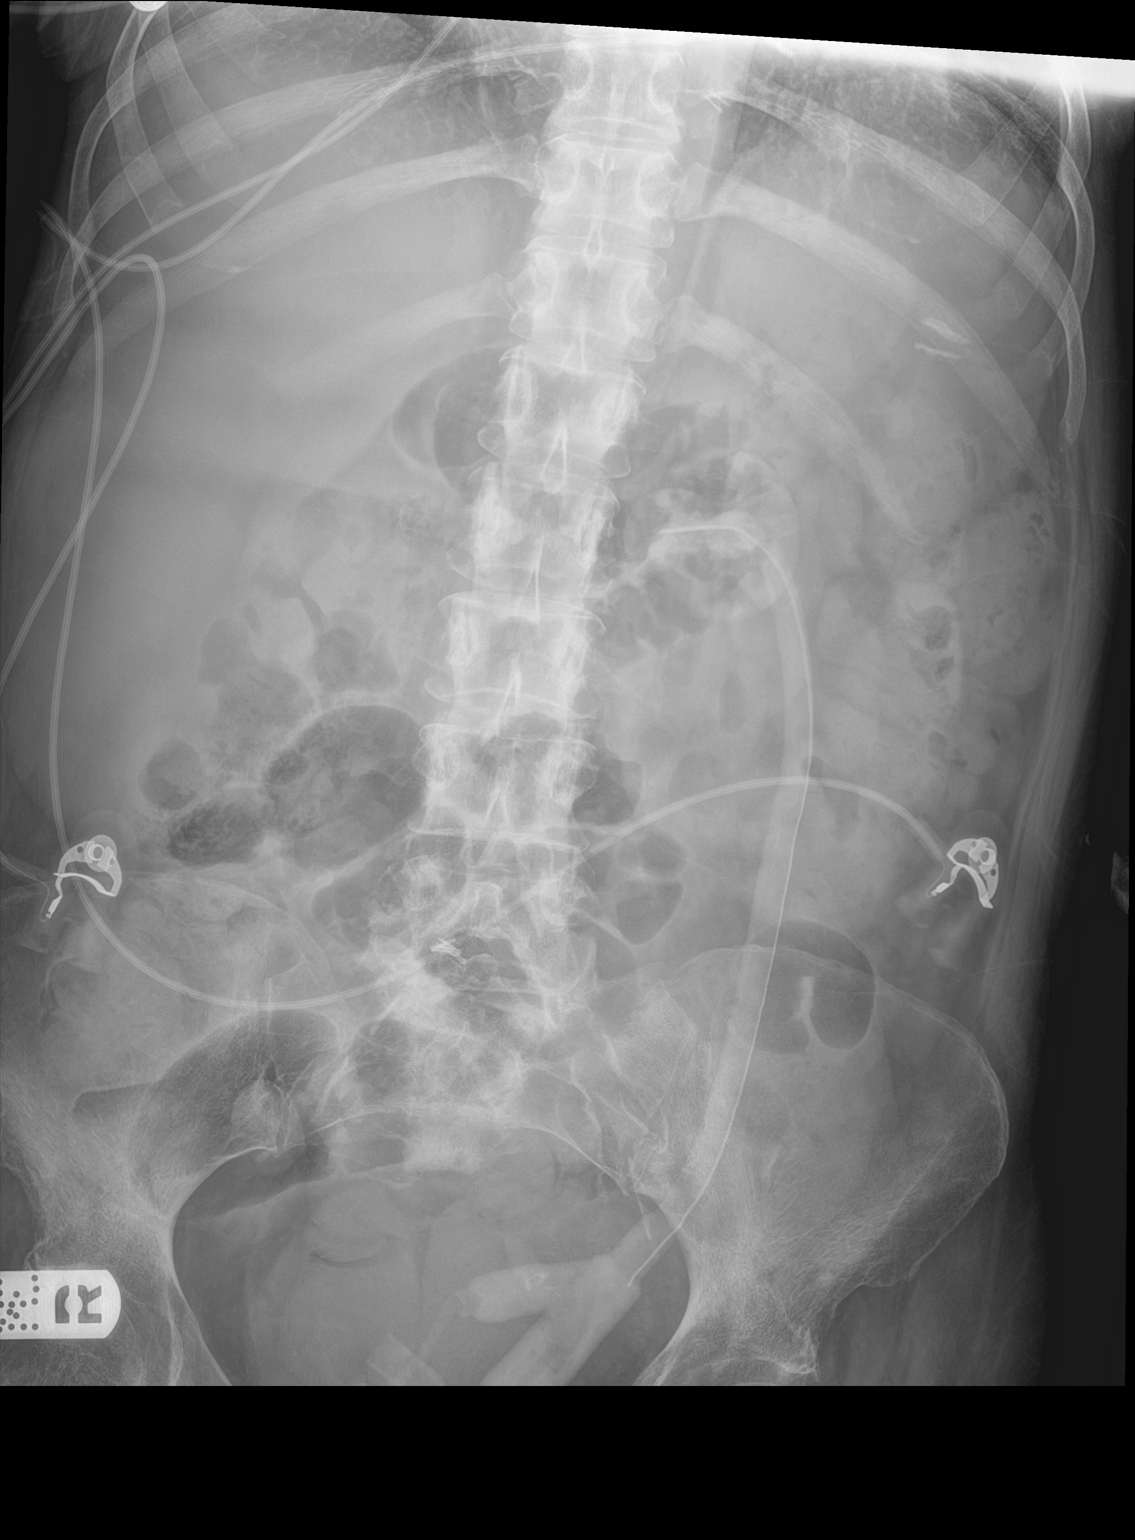

[1 of 1 positions shown; findings below may reference images not displayed]

FINDINGS: The percutaneous gastrostomy tube is similar in position to the
prior study, projecting over the gastric body. Gas and stool are
present in the colon. No dilated loops of bowel are seen to suggest
obstruction. No acute osseous abnormality is identified.
IMPRESSION: Percutaneous gastrostomy tube overlying the stomach.

## 2020-09-13 IMAGING — CT CT ABDOMEN AND PELVIS WITH CONTRAST
2 of 5 series · 15 of 46 positions shown, 17 images · IV contrast (ISOVUE)
Comparison: 12/16/2017

CLINICAL DATA: Abdominal pain since this morning.

EXAM:
CT ABDOMEN AND PELVIS WITH CONTRAST
TECHNIQUE: Multidetector CT imaging of the abdomen and pelvis was performed
using the standard protocol following bolus administration of
intravenous contrast.
CONTRAST:  80mL OMNIPAQUE IOHEXOL 300 MG/ML  SOLN

[Series 2: axial st · axial · 0.87mm/px · z∈[+1083,+1493]mm · 12 of 94 slices shown, 14 images]
[im 6/94  soft-tissue]
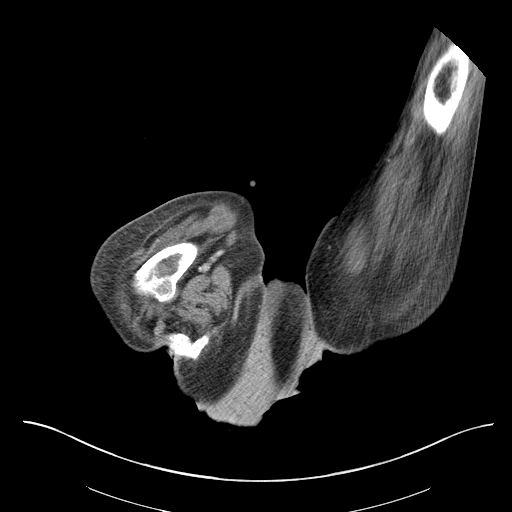
[im 6/94  bone]
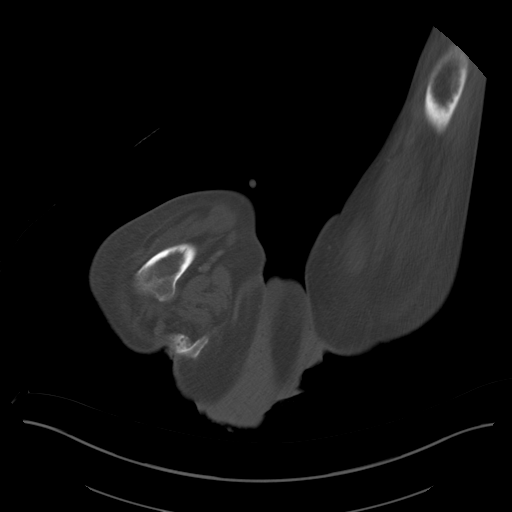
[im 12/94  soft-tissue]
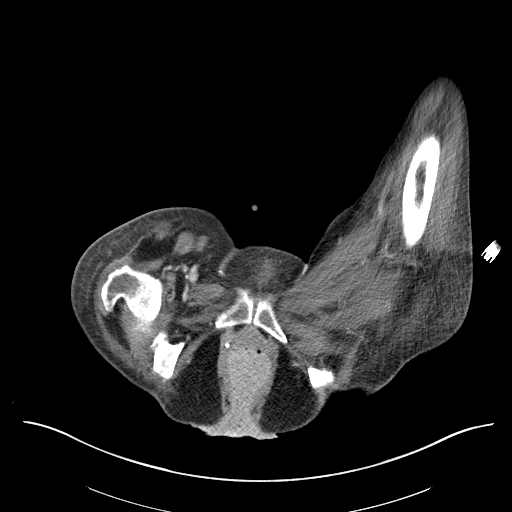
[im 24/94  soft-tissue]
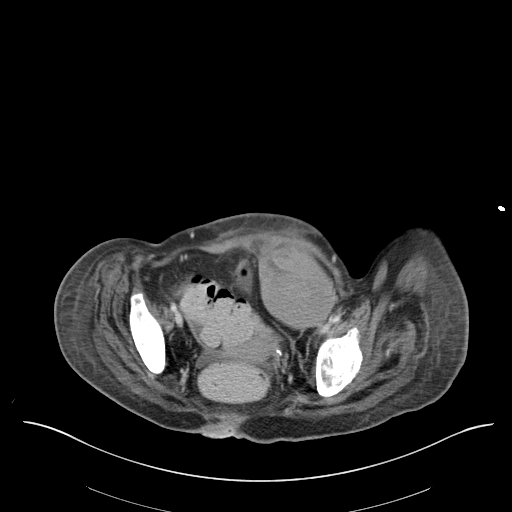
[im 30/94  soft-tissue]
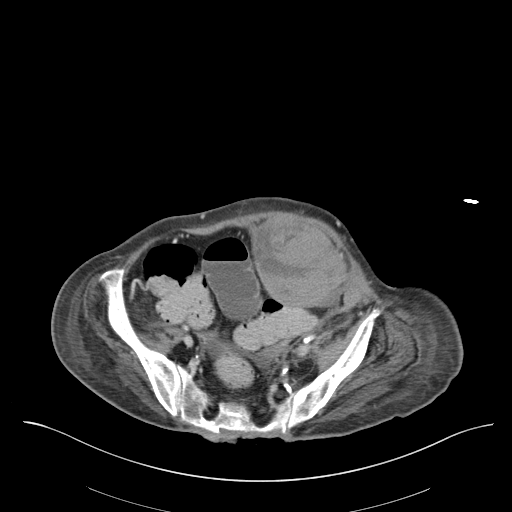
[im 35/94  soft-tissue]
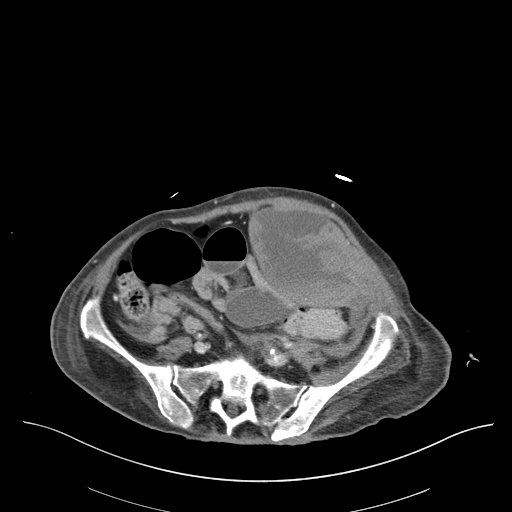
[im 41/94  soft-tissue]
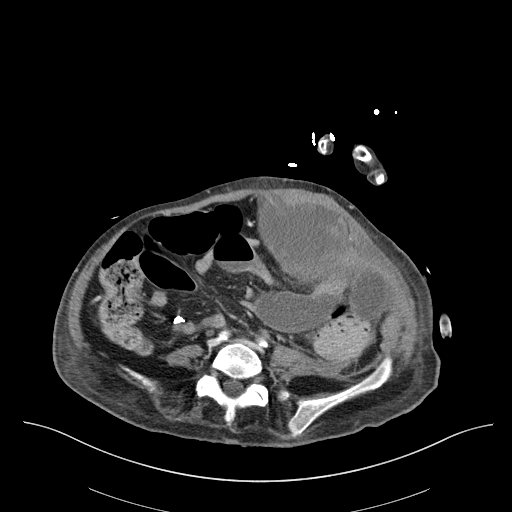
[im 53/94  soft-tissue]
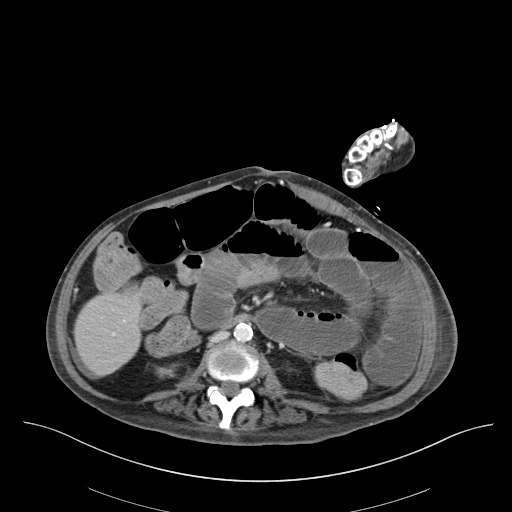
[im 59/94  soft-tissue]
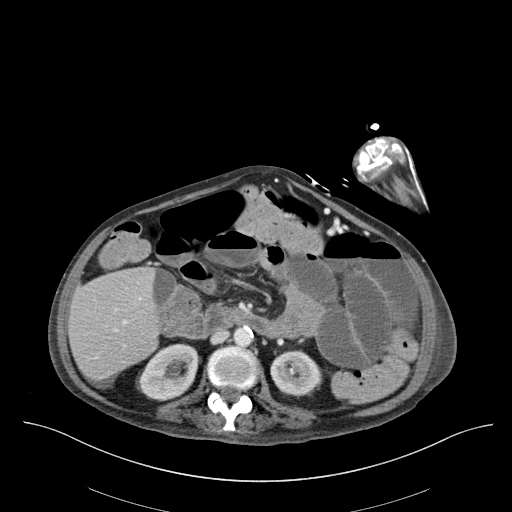
[im 64/94  soft-tissue]
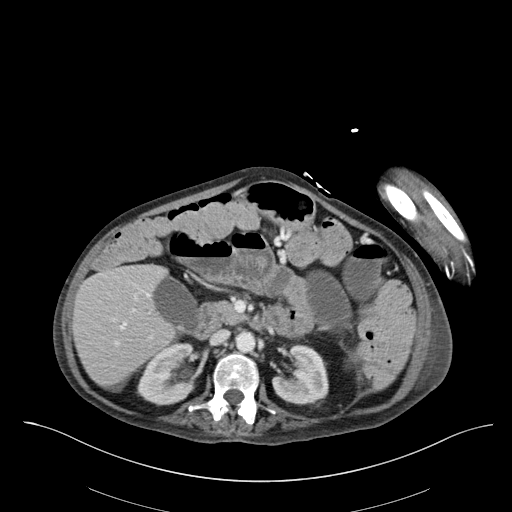
[im 64/94  bone]
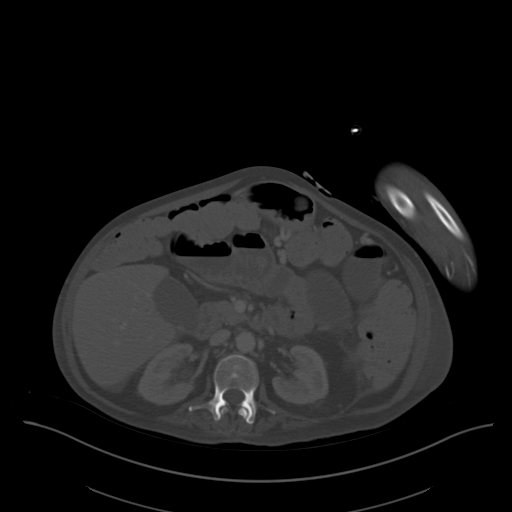
[im 70/94  soft-tissue]
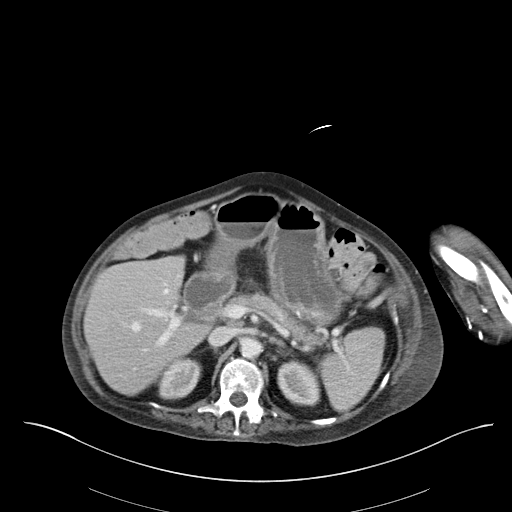
[im 82/94  soft-tissue]
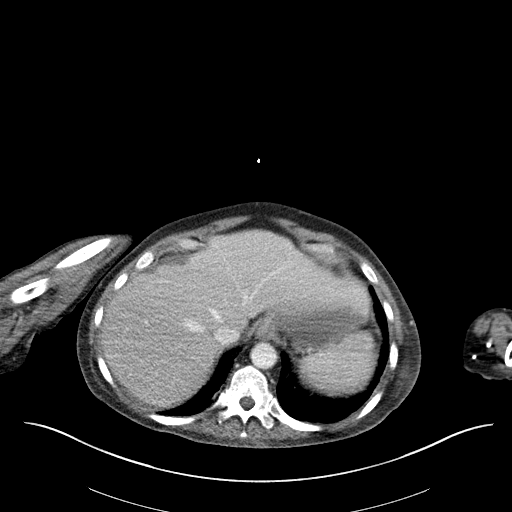
[im 88/94  soft-tissue]
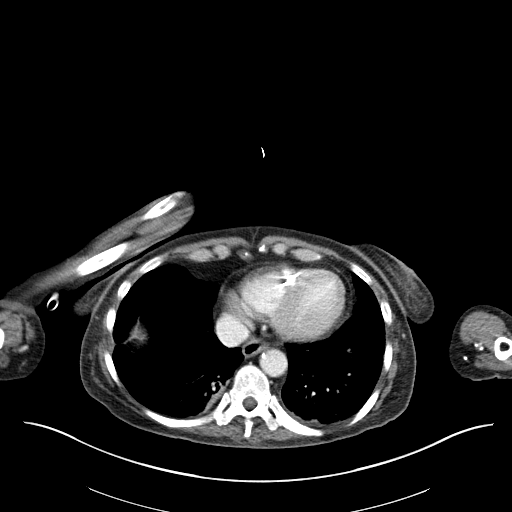

[Series 5: coronal st · coronal · 0.76mm/px · 3 of 129 slices shown]
[im 43/129  soft-tissue]
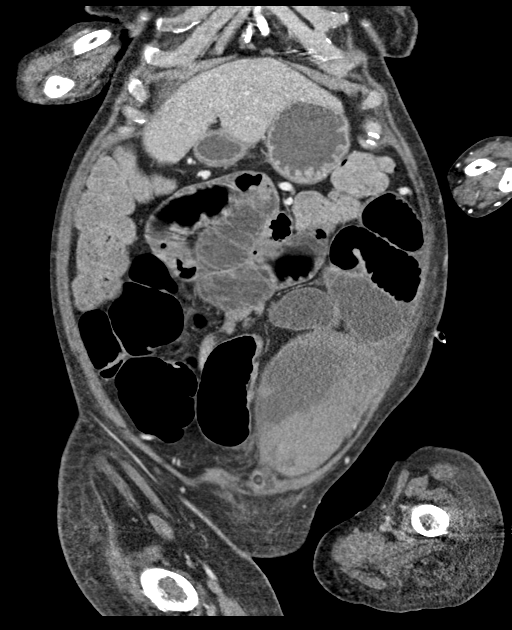
[im 57/129  soft-tissue]
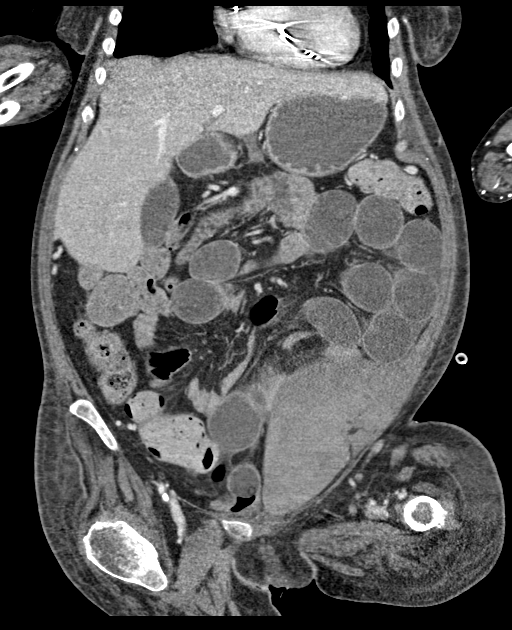
[im 72/129  soft-tissue]
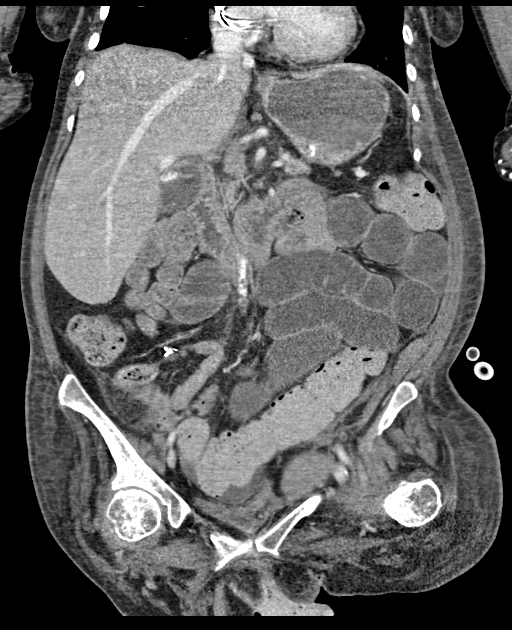

[15 of 46 positions shown; findings below may reference images not displayed]

FINDINGS: Lower chest: Bibasilar atelectasis and scarring changes. No definite
infiltrates or effusions. The heart is normal in size. No
pericardial effusion. Pacer wires are noted.

Hepatobiliary: No focal hepatic lesions or intrahepatic biliary
dilatation. The gallbladder demonstrates small gallstones but no
findings for acute cholecystitis. No common bile duct dilatation.

Pancreas: No mass, inflammation or ductal dilatation.

Spleen: Normal size.  No focal lesions.

Adrenals/Urinary Tract: The adrenal glands and kidneys are
unremarkable. There is a lower pole left renal calculus noted but no
obstructing ureteral calculi or bladder calculi. No obvious bladder
lesions. Patient has a suprapubic urinary catheter in place.

Stomach/Bowel: The stomach contains a feeding gastrostomy tube. No
complicating features are identified. There is mild distention of
the stomach with fluid. The duodenum is unremarkable. The proximal
and mid small bowel loops are dilated and demonstrate air-fluid
levels consistent with obstruction. There appears to be a transition
to nondilated/decompressed loops of small bowel in the upper pelvis
in the midline (series 2, image 53 and series 5 image 49, 50 and
51). This is likely due to adhesions from prior surgery.

Moderate stool in the colon and down into the rectum. No colonic
mass or obstruction. The terminal ileum appears normal.

Vascular/Lymphatic: Advanced atherosclerotic calcifications
involving the aorta iliac arteries but no aneurysm. The branch
vessels are patent. The major venous structures are patent.

Small scattered mesenteric and retroperitoneal lymph nodes but no
mass or overt adenopathy.

Reproductive: The uterus and ovaries are unremarkable.

Other: There is a large left rectus sheath hematoma measuring
approximately 17.5 x 13.0 x 7.5 cm.

Musculoskeletal: No significant bony findings. Advanced osteoporosis
for age.

A sacral decubitus ulcer is noted and extends down very close to the
bone. I do not see any obvious destructive bony changes or ever it
appears that the lower sacrum and coccyx have been previously
resected.
IMPRESSION: 1. CT findings consistent with a fairly high-grade small-bowel
obstruction in the mid small bowel region in the central abdomen at
the level of the iliac crest. This is most likely due to adhesions.
2. Very large left rectus sheath hematoma.
3. Feeding gastrostomy tube and suprapubic catheter is in good
position without complicating features.
4. Sacral decubitus ulcer.
5. Marked age advanced vascular calcifications for age.
6. Lower pole left renal calculus but no obstructing ureteral
calculi.

## 2020-09-13 IMAGING — DX PORTABLE CHEST - 1 VIEW
1 series · 1 of 1 positions shown · non-contrast
Comparison: Portable exam 0547 hours compared to 07/20/2018

CLINICAL DATA: Abdominal pain since 7777 hours this morning,
nausea, single episode of vomiting, abdominal distension today,
quadriplegia

EXAM:
PORTABLE CHEST 1 VIEW

[chest ap]
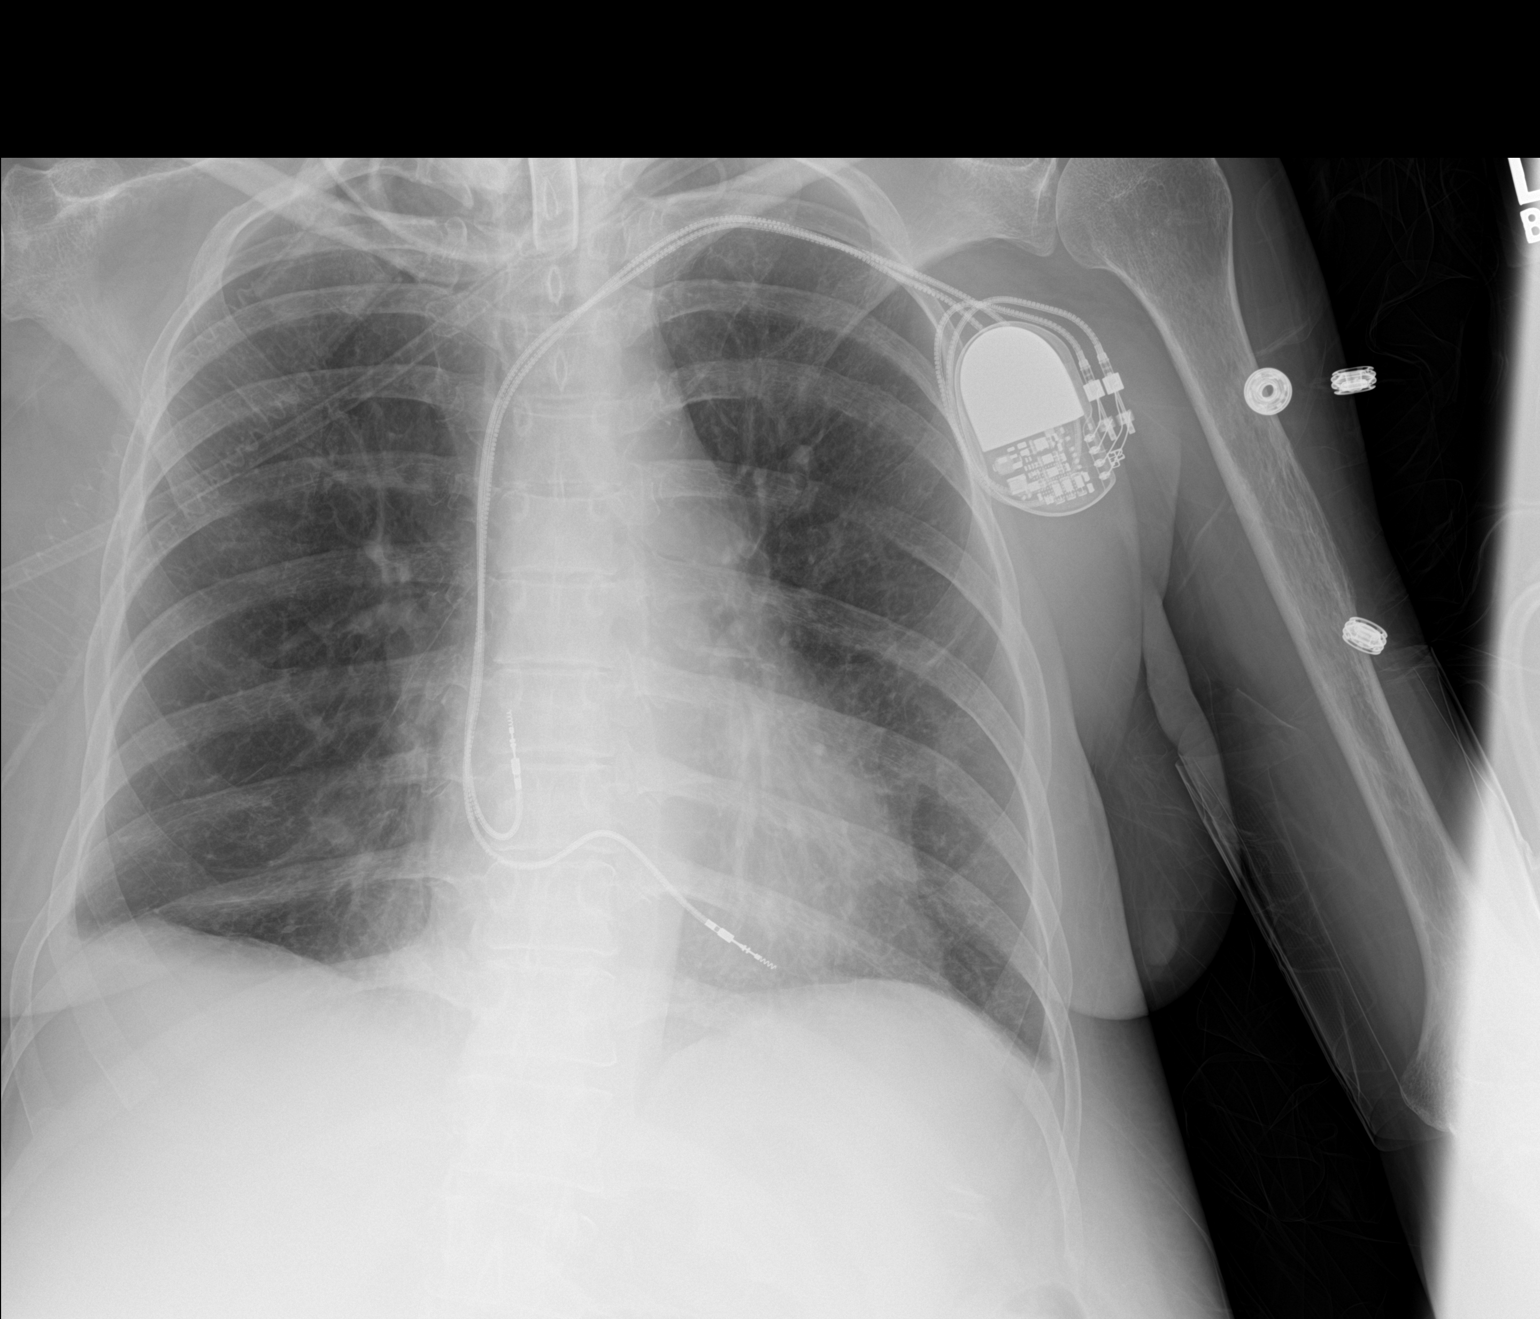

[1 of 1 positions shown; findings below may reference images not displayed]

FINDINGS: LEFT subclavian pacemaker with leads projecting over RIGHT atrium
and RIGHT ventricle.

Tracheostomy tube projects over tracheal air column.

Normal heart size, mediastinal contours, and pulmonary vascularity.

RIGHT basilar scarring.

Chronic accentuation of retrocardiac LEFT lower lobe markings
unchanged since 06/22/2018

Remaining lungs clear.

No pleural effusion or pneumothorax.

No acute osseous findings.
IMPRESSION: Chronic bibasilar changes without definite acute infiltrate.

## 2020-09-14 IMAGING — DX PORTABLE ABDOMEN - 1 VIEW
1 series · 1 of 1 positions shown · non-contrast
Comparison: CT scan dated 08/10/2018

CLINICAL DATA: Small bowel obstruction.

EXAM:
PORTABLE ABDOMEN - 1 VIEW

[abdomen kub]
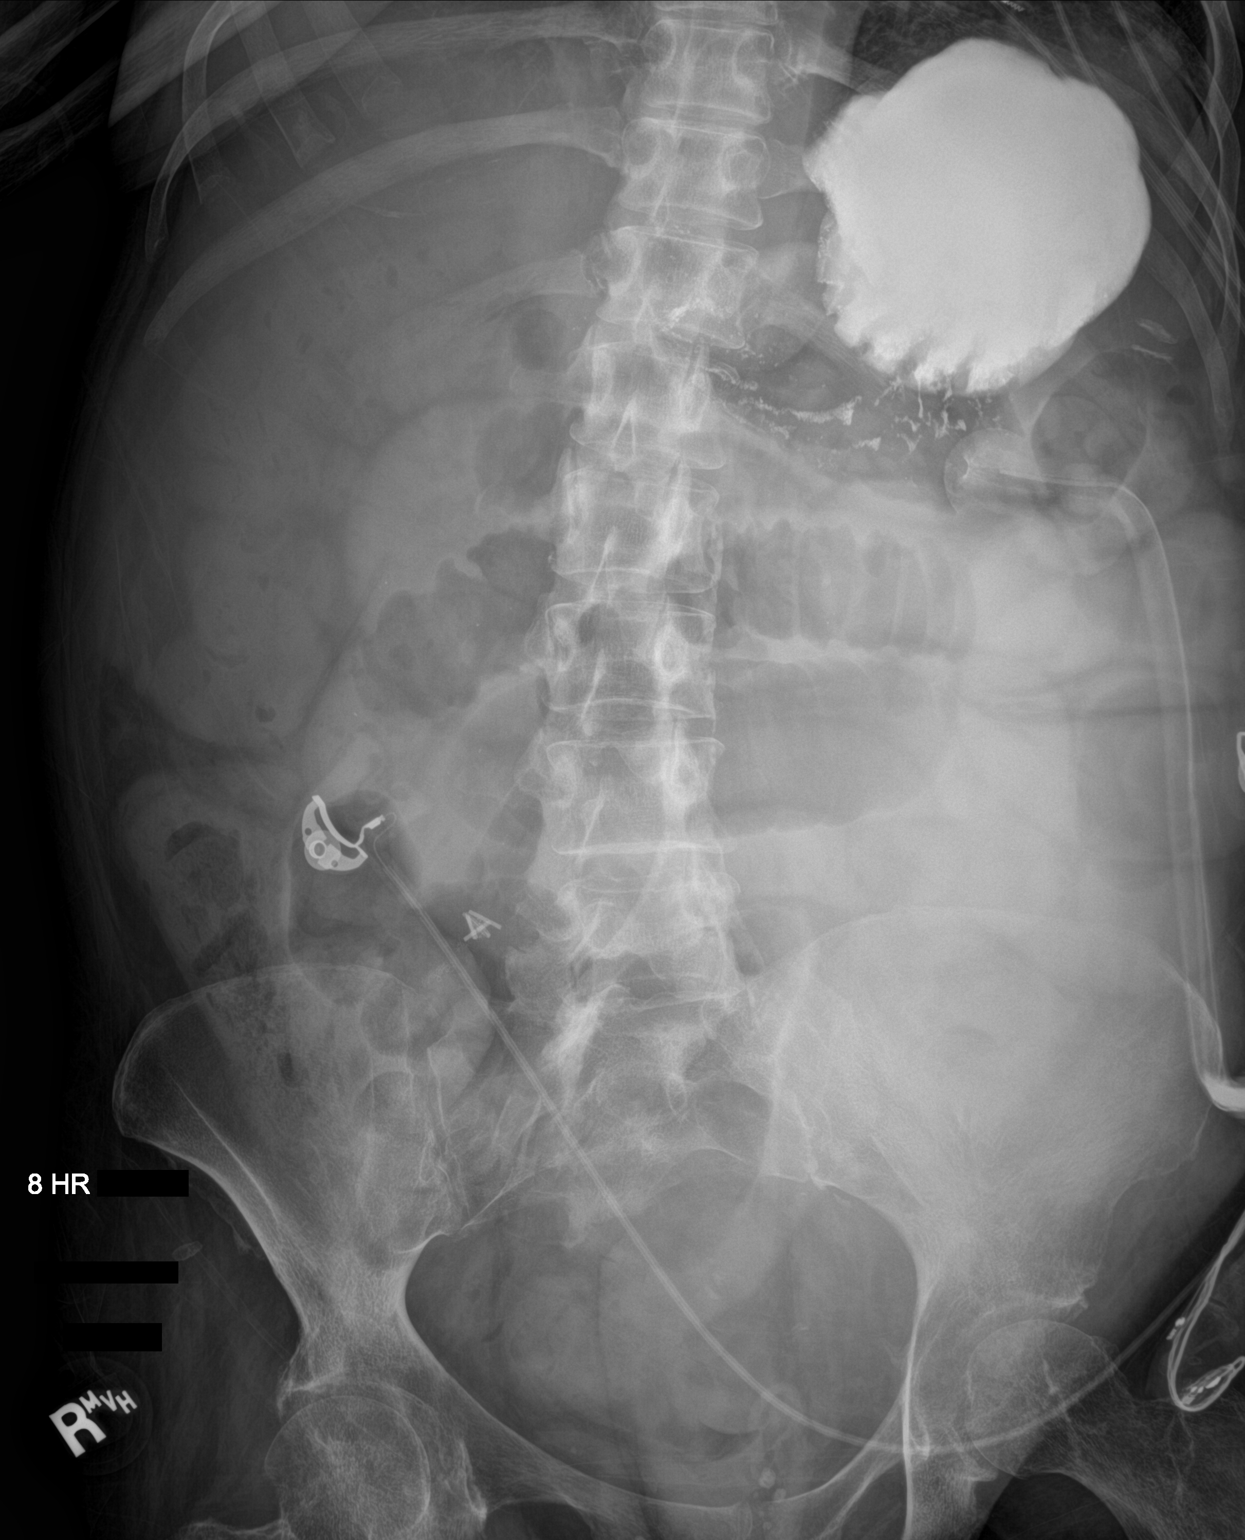

[1 of 1 positions shown; findings below may reference images not displayed]

FINDINGS: There are multiple persistent dilated loops of small bowel. Contrast
is also seen in the fundus of the stomach. Gastrostomy tube in
place. Colon does not appear distended.
IMPRESSION: Persistent small bowel obstruction, slightly progressed.

## 2020-09-15 IMAGING — DX PORTABLE ABDOMEN - 1 VIEW
2 series · 2 of 2 positions shown · non-contrast
Comparison: Radiograph August 11, 2018.

CLINICAL DATA: Abdominal pain, abdominal distention.

EXAM:
PORTABLE ABDOMEN - 1 VIEW

[abdomen kub (1 of 2)]
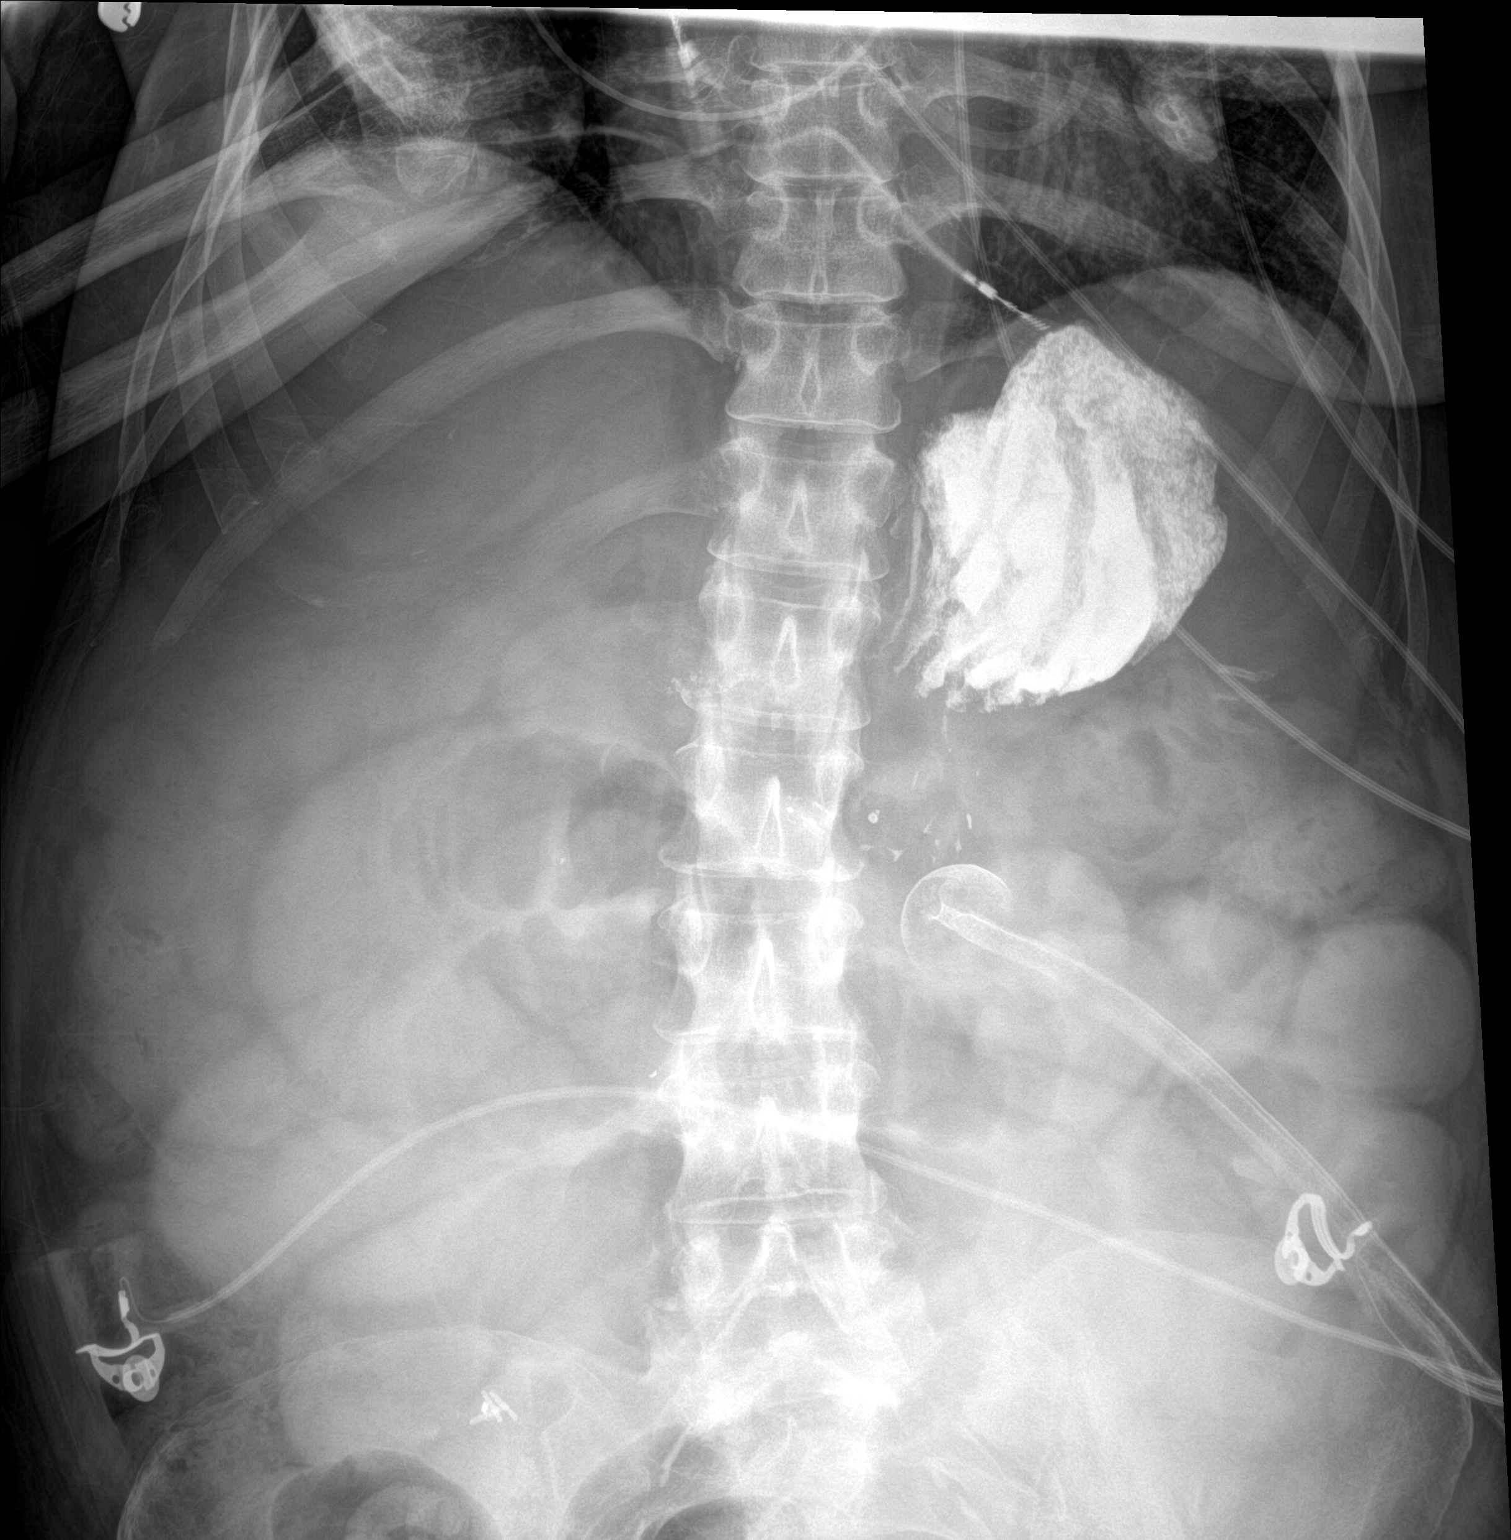

[abdomen kub (2 of 2)]
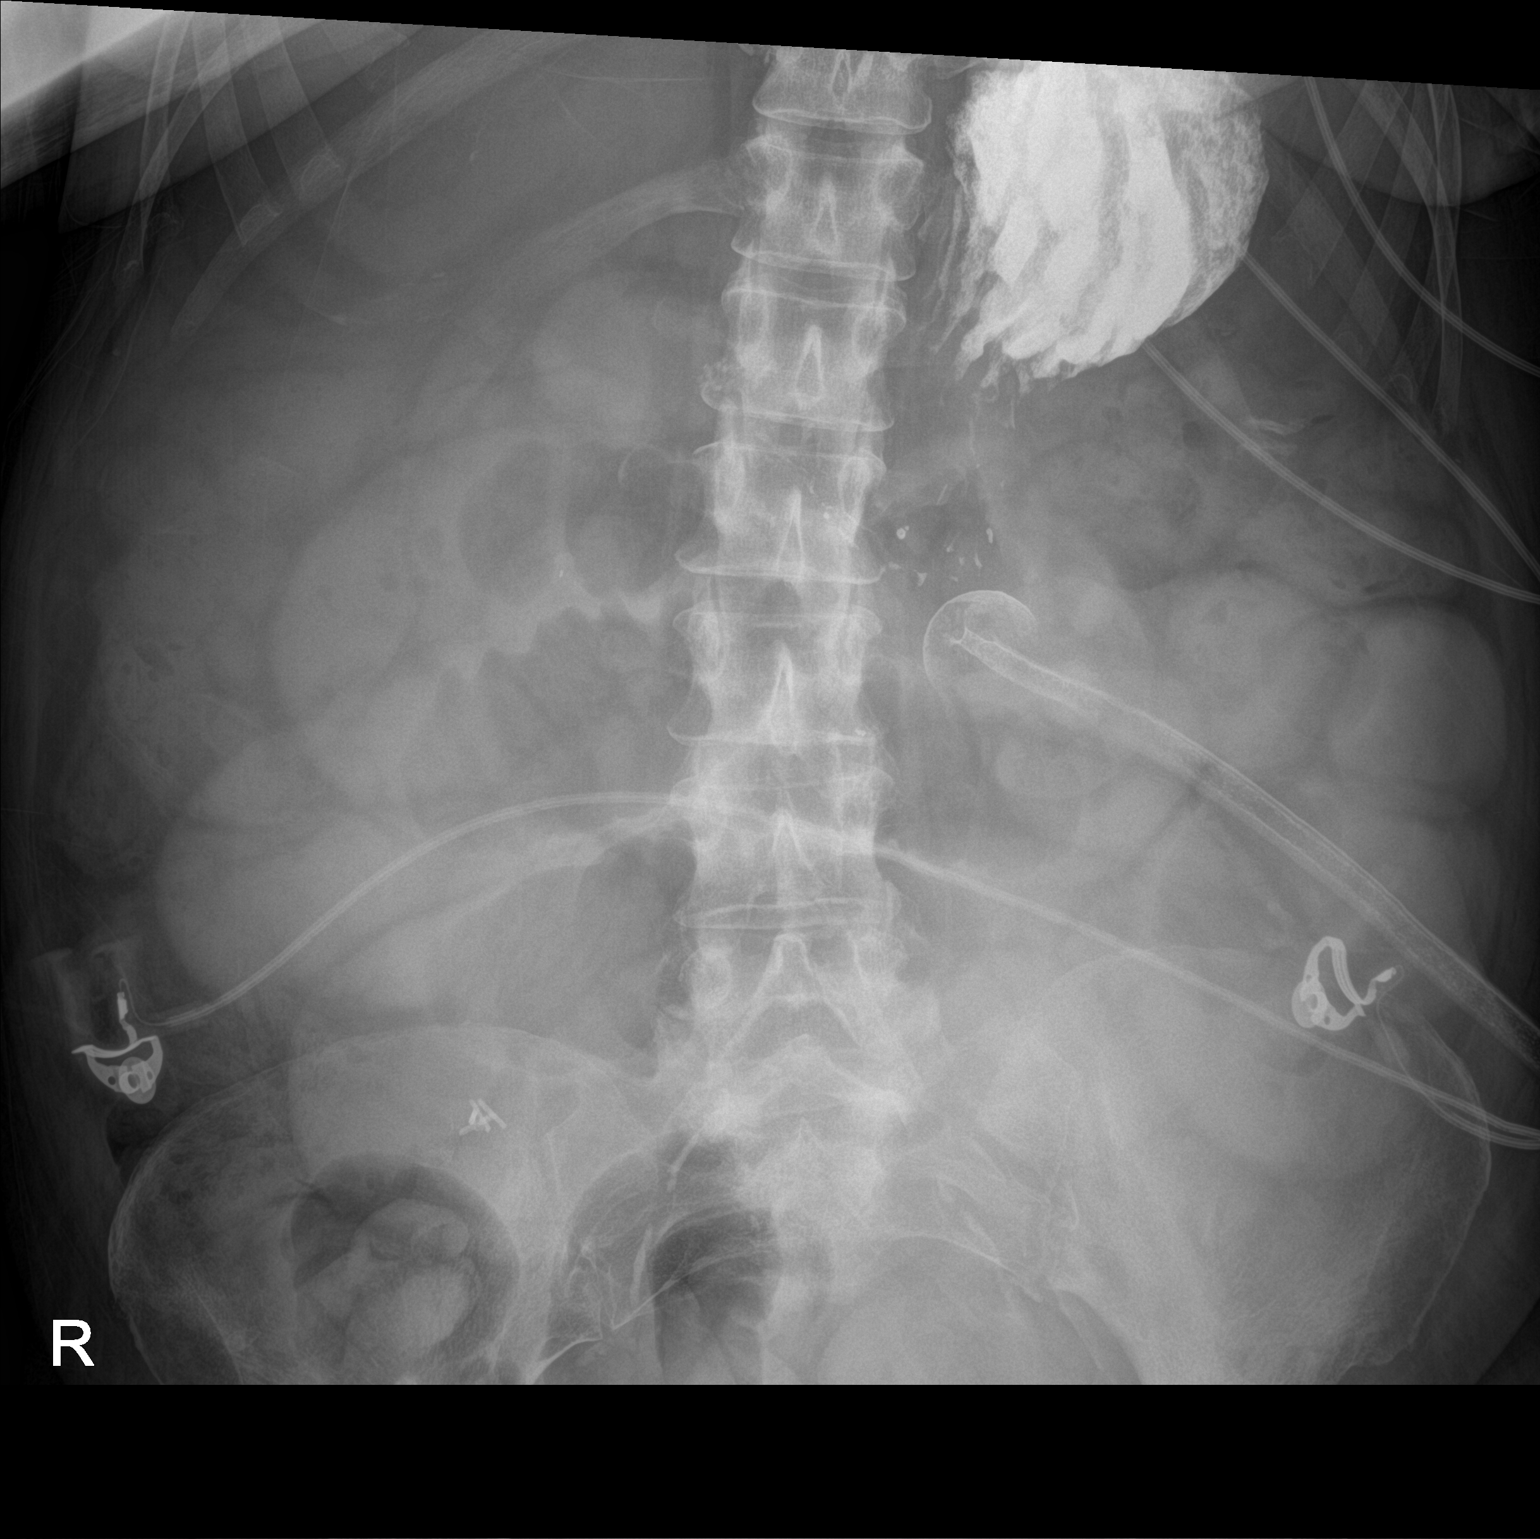

[2 of 2 positions shown; findings below may reference images not displayed]

FINDINGS: Stable small bowel dilatation is noted concerning for distal small
bowel obstruction. No colonic dilatation is noted. Residual contrast
remains within the gastric fundus. Gastrostomy tube is again noted
and unchanged.
IMPRESSION: Stable small bowel dilatation is noted concerning for distal small
bowel obstruction.

## 2020-09-16 IMAGING — DX PORTABLE ABDOMEN - 1 VIEW
1 series · 1 of 1 positions shown · non-contrast
Comparison: Radiographs August 12, 2018.

CLINICAL DATA: Small-bowel obstruction.

EXAM:
PORTABLE ABDOMEN - 1 VIEW

[abdomen kub]
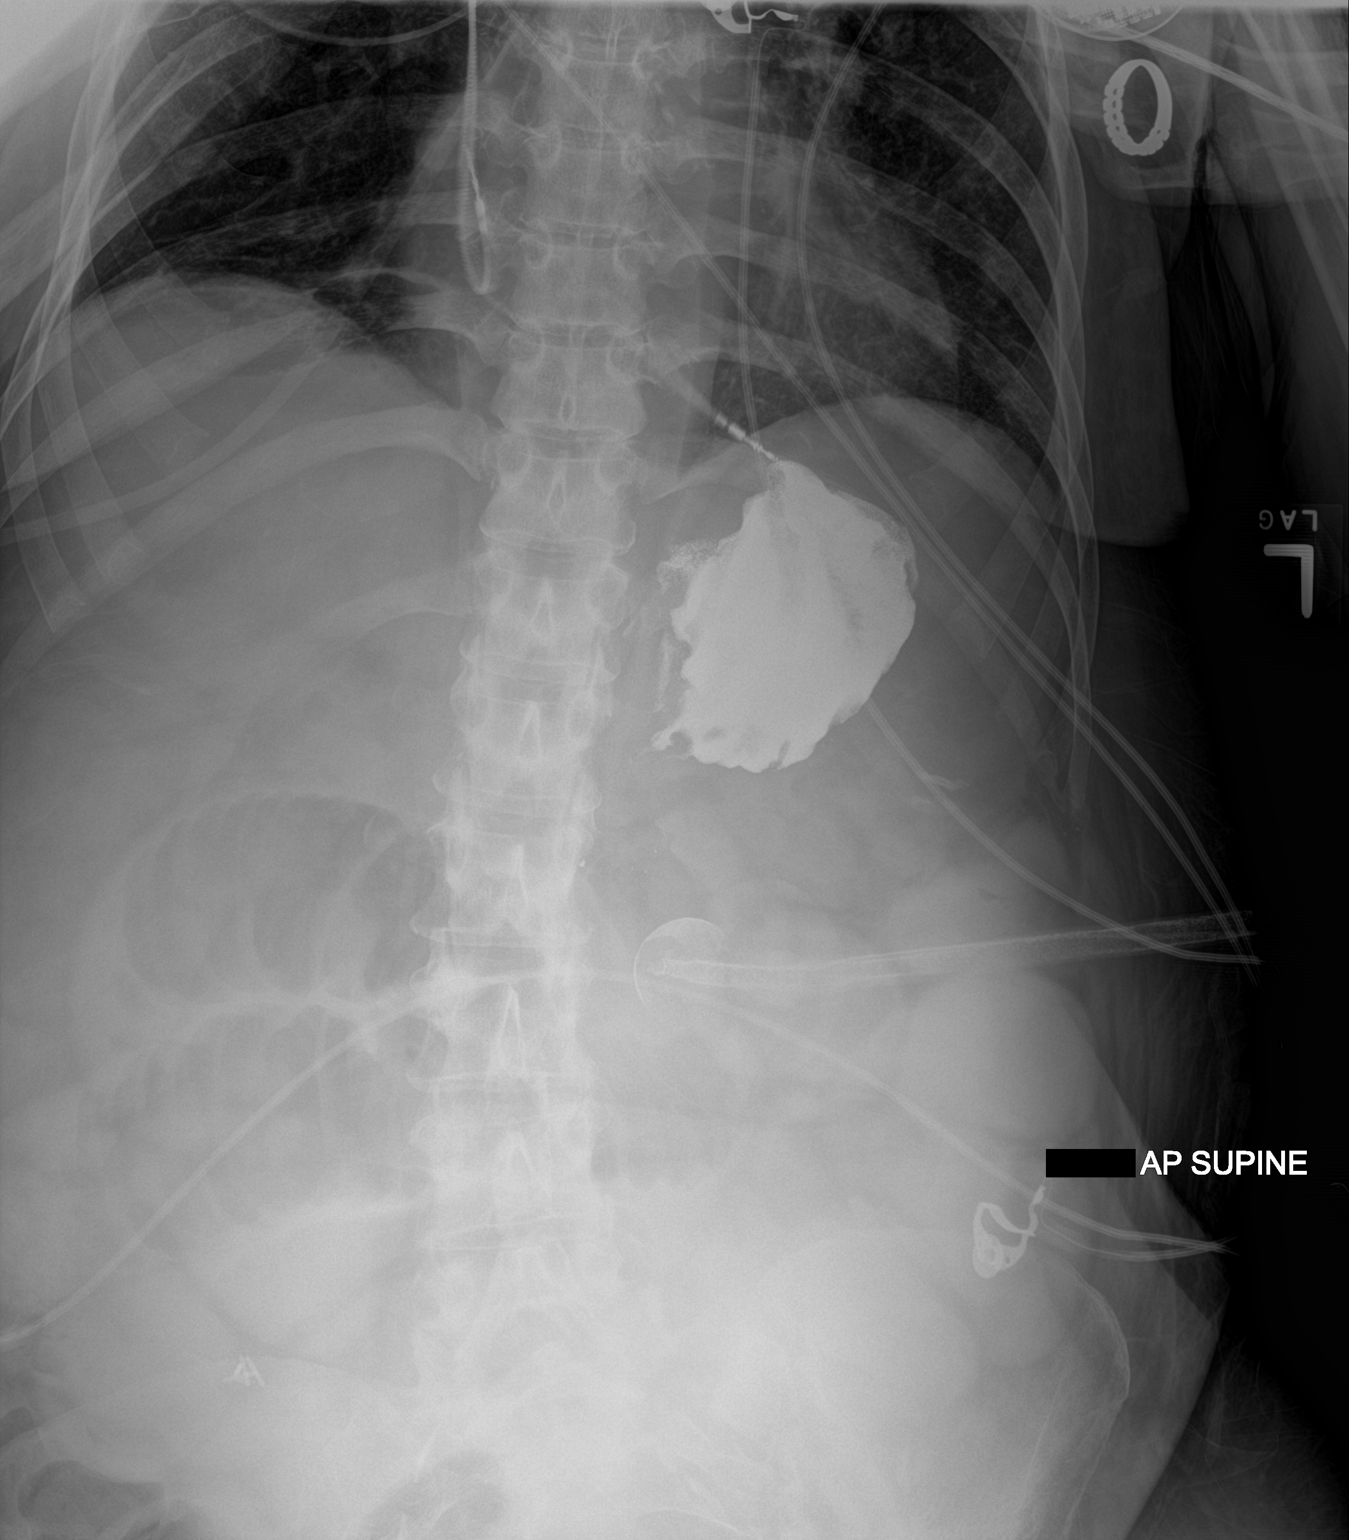

[1 of 1 positions shown; findings below may reference images not displayed]

FINDINGS: Grossly stable small bowel dilatation is noted centrally. Residual
contrast is noted within the gastric fundus. Gastrostomy tube is
unchanged in position.
IMPRESSION: Grossly stable small bowel dilatation is noted concerning for distal
small bowel obstruction.

## 2020-09-18 IMAGING — DX PORTABLE ABDOMEN - 1 VIEW
1 series · 1 of 1 positions shown · non-contrast
Comparison: Abdominal radiograph 08/13/2018.

CLINICAL DATA: 47-year-old female with history of small-bowel
obstruction.

EXAM:
PORTABLE ABDOMEN - 1 VIEW

[abdomen kub]
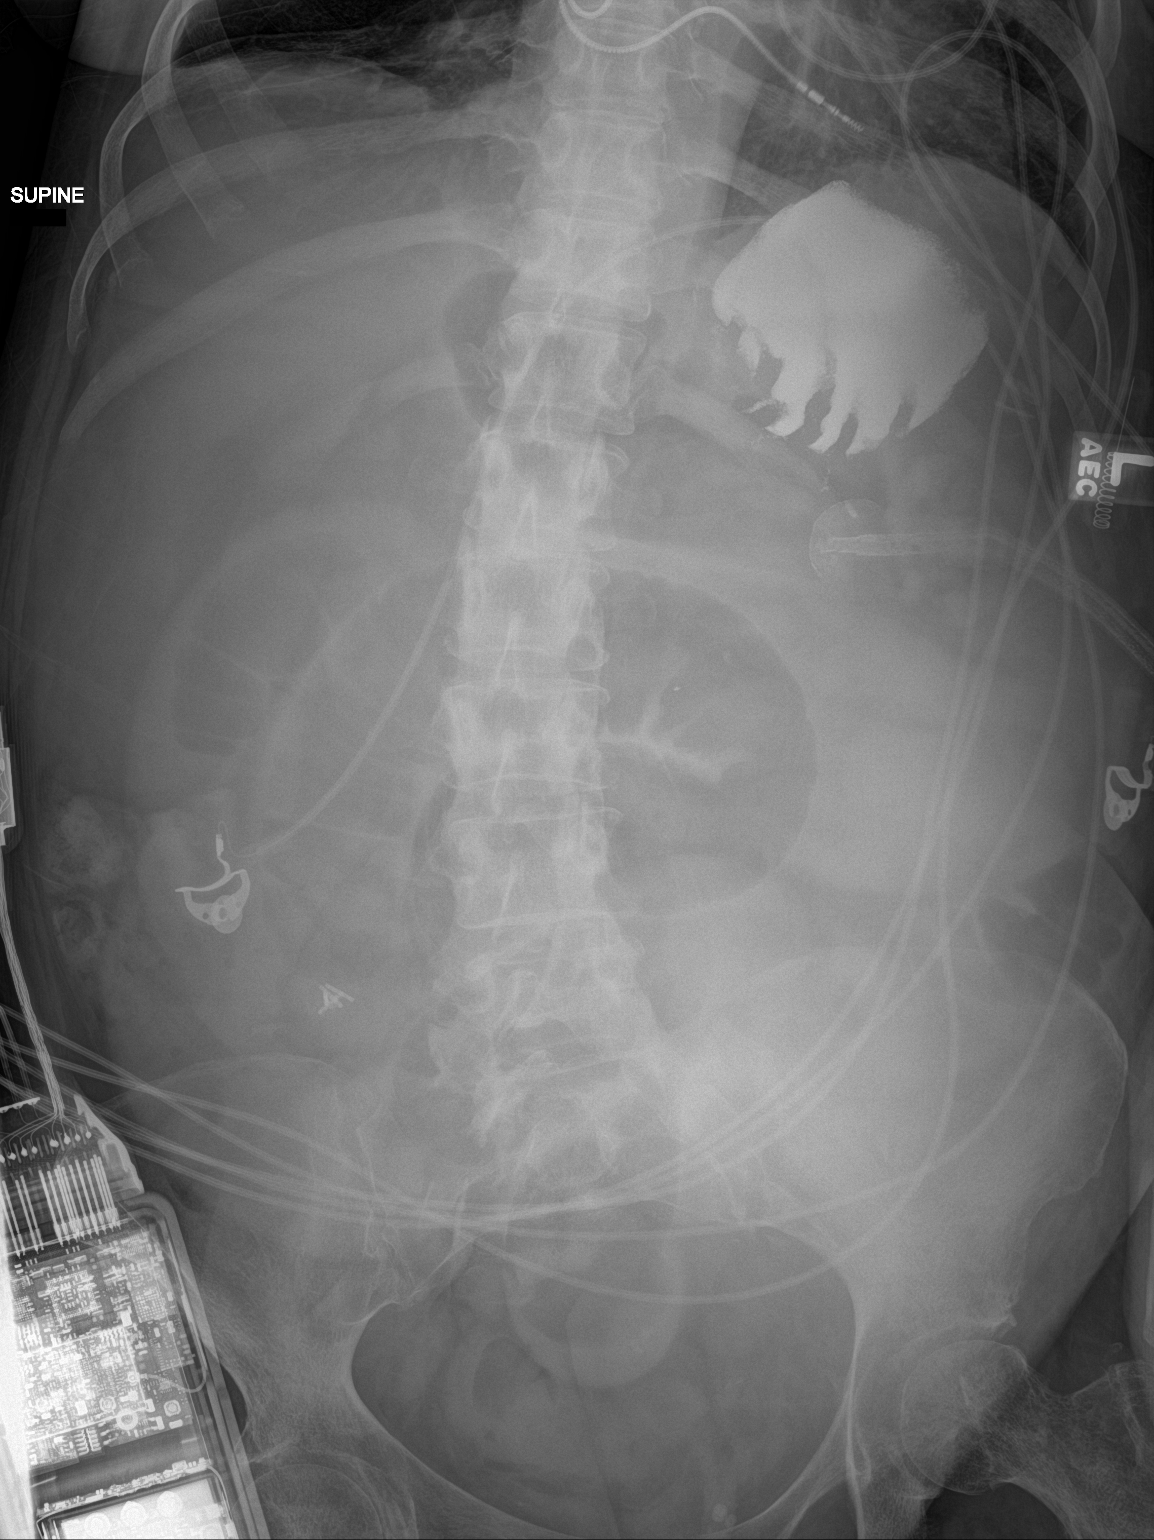

[1 of 1 positions shown; findings below may reference images not displayed]

FINDINGS: Percutaneous gastrostomy tube with retention balloon projecting over
the epigastric region. Contrast in the stomach. Severe dilatation of
small bowel loops which are gas-filled projecting over the mid
abdomen measuring up to 4.6 cm in diameter. Paucity of colonic and
rectal gas. No gross evidence of pneumoperitoneum. Pacemaker leads
projecting over the lower thorax. Surgical clips projecting over the
right lower abdomen.
IMPRESSION: 1. The appearance of the abdomen remains compatible with small bowel
obstruction.
2. Postoperative changes and support apparatus, as above.

## 2020-09-19 IMAGING — XA PICC
1 series · 1 of 1 positions shown · IV contrast (agent unspecified)
Comparison: none

INDICATION: Poor venous access. In need of durable intravenous access for the
initiation of NADINE MILLOGO.

EXAM:
ULTRASOUND AND FLUOROSCOPIC GUIDED PICC LINE INSERTION
MEDICATIONS:
None.
CONTRAST:  None
FLUOROSCOPY TIME:  24 seconds (1 mGy)
COMPLICATIONS:
None immediate.
TECHNIQUE: The procedure, risks, benefits, and alternatives were explained to
the patient and informed written consent was obtained. A timeout was
performed prior to the initiation of the procedure.

[Series 300: ir picc placement right <5 yrs inc img g · 1 of 1 slices shown]
[im 1/1]
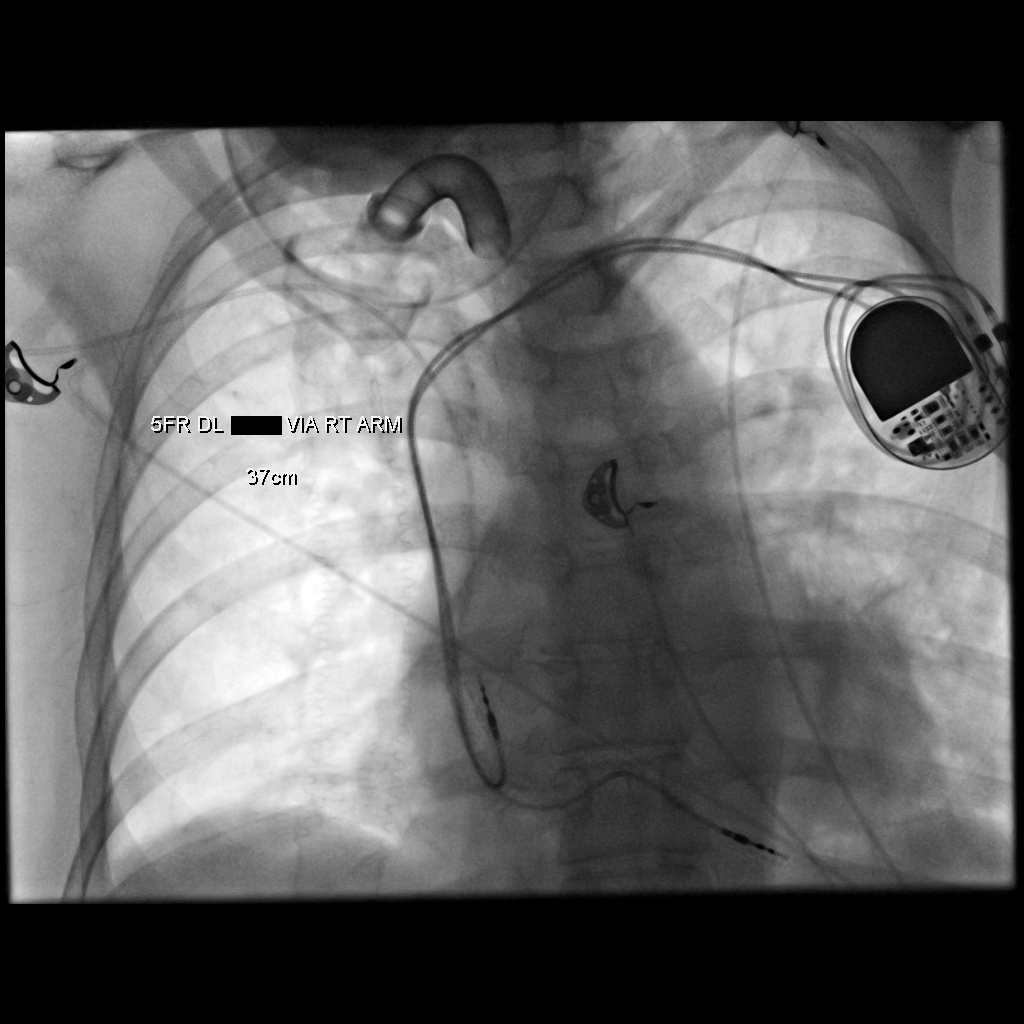

[1 of 1 positions shown; findings below may reference images not displayed]

Given the presence of the left anterior chest wall pacemaker
decision was made to place a right upper extremity approach PICC
line. As such, the right upper extremity was prepped with
chlorhexidine in a sterile fashion, and a sterile drape was applied
covering the operative field. Maximum barrier sterile technique with
sterile gowns and gloves were used for the procedure. A timeout was
performed prior to the initiation of the procedure. Local anesthesia
was provided with 1% lidocaine.

Under direct ultrasound guidance, the brachial vein was accessed
with a micropuncture kit after the overlying soft tissues were
anesthetized with 1% lidocaine.

After the overlying soft tissues were anesthetized, a small venotomy
incision was created and a micropuncture kit was utilized to access
the right brachial vein. Real-time ultrasound guidance was utilized
for vascular access including the acquisition of a permanent
ultrasound image documenting patency of the accessed vessel.

A guidewire was advanced to the level of the superior caval-atrial
junction for measurement purposes and the PICC line was cut to
length. A peel-away sheath was placed and a 37 cm, 5 French, dual
lumen was inserted to level of the superior caval-atrial junction. A
post procedure spot fluoroscopic was obtained. The catheter easily
aspirated and flushed and was sutured in place. A dressing was
placed. The patient tolerated the procedure well without immediate
post procedural complication.
FINDINGS: After catheter placement, the tip lies within the superior
cavoatrial junction. The catheter aspirates and flushes normally and
is ready for immediate use.
IMPRESSION: Successful ultrasound and fluoroscopic guided placement of a right
brachial vein approach, 37 cm, 5 French, dual lumen PICC with tip at
the superior caval-atrial junction. The PICC line is ready for
immediate use.

## 2020-09-20 IMAGING — DX PORTABLE ABDOMEN - 1 VIEW
1 series · 1 of 1 positions shown · non-contrast
Comparison: 08/15/2018

CLINICAL DATA: Small bowel obstruction

EXAM:
PORTABLE ABDOMEN - 1 VIEW

[abdomen kub]
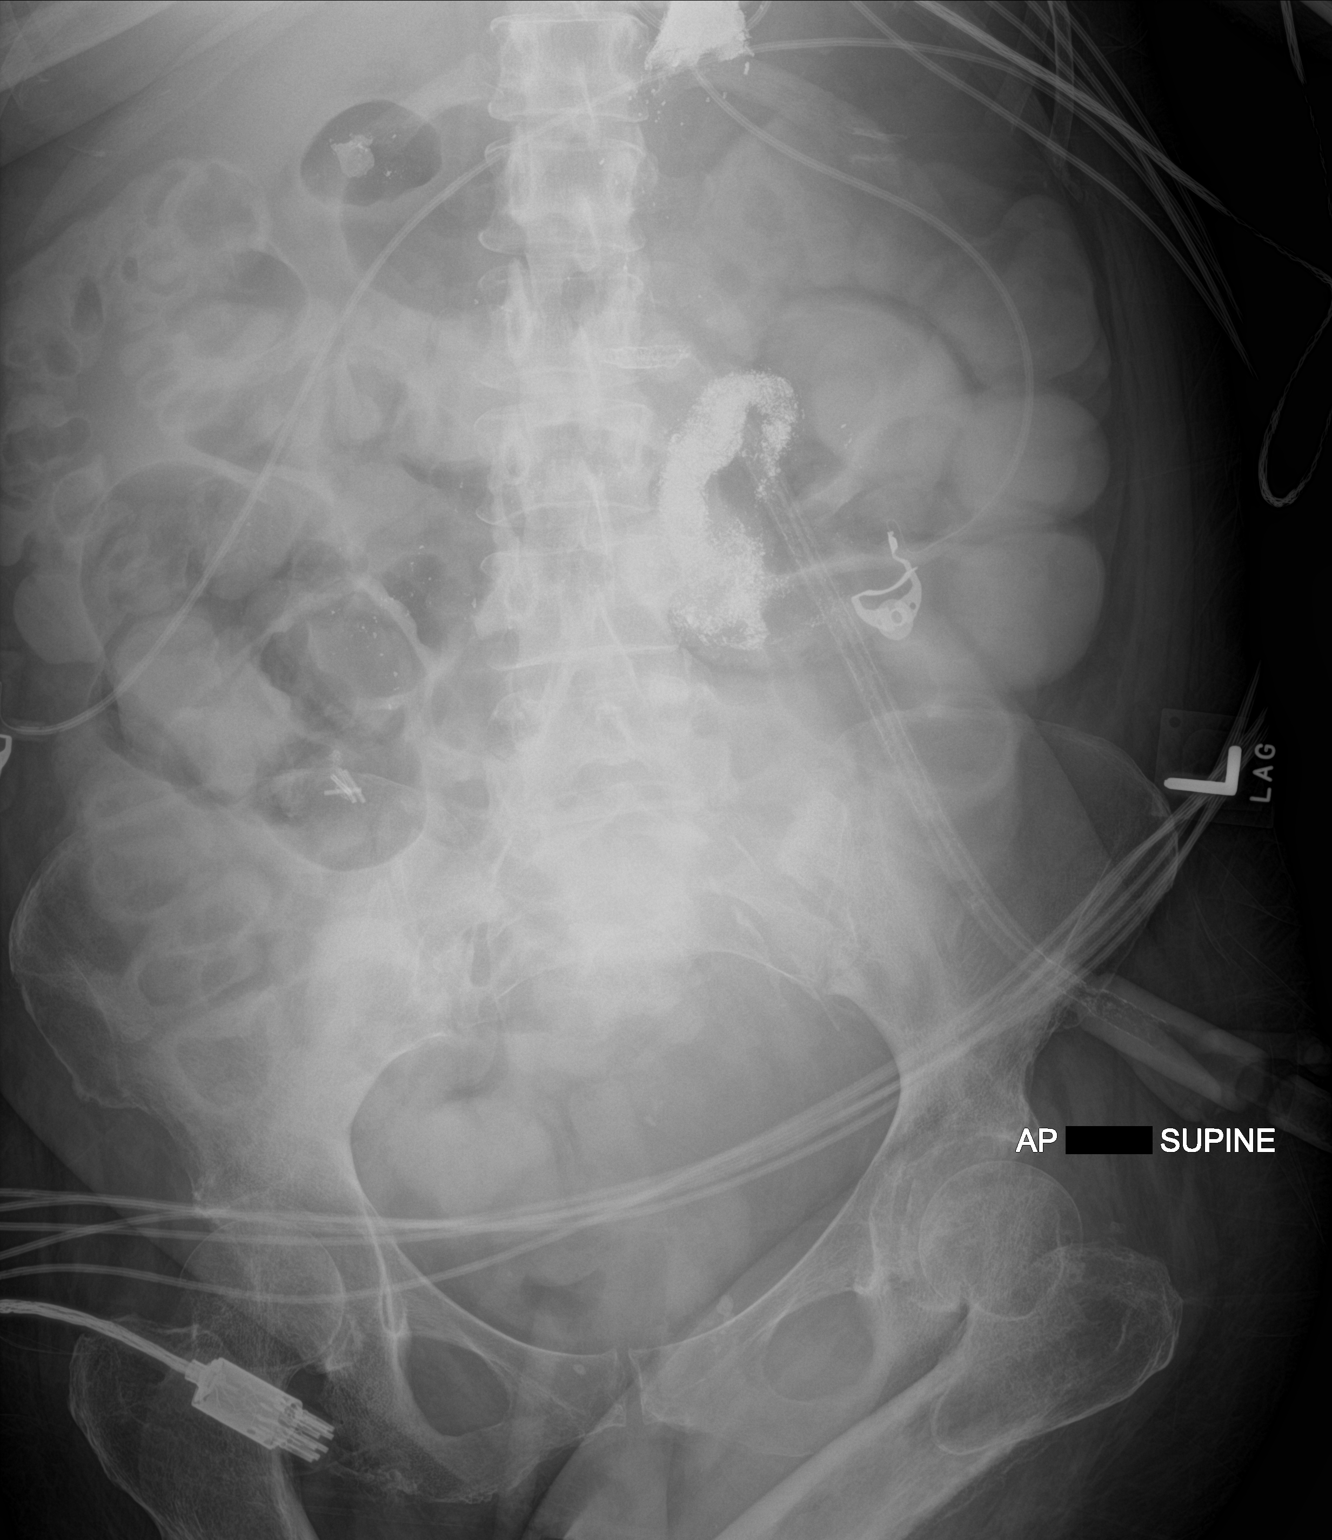

[1 of 1 positions shown; findings below may reference images not displayed]

FINDINGS: A small amount of contrast is identified within small bowel loops in
the LEFT mid abdomen. Persistent dilatation of small bowel loops is
noted. Large bowel loops are not dilated. Patient is gastrostomy
tube in the LEFT UPPER QUADRANT. Chronic deformities of the hips.
IMPRESSION: Persistent small bowel dilatation. Small amount of contrast within
small bowel loops.

## 2020-09-21 IMAGING — DX PORTABLE ABDOMEN - 1 VIEW
1 series · 1 of 1 positions shown · non-contrast
Comparison: Multiple previous abdominal films. Most recent is
yesterday.

CLINICAL DATA: Abdominal distension. History of small-bowel
obstruction.

EXAM:
PORTABLE ABDOMEN - 1 VIEW

[abdomen kub]
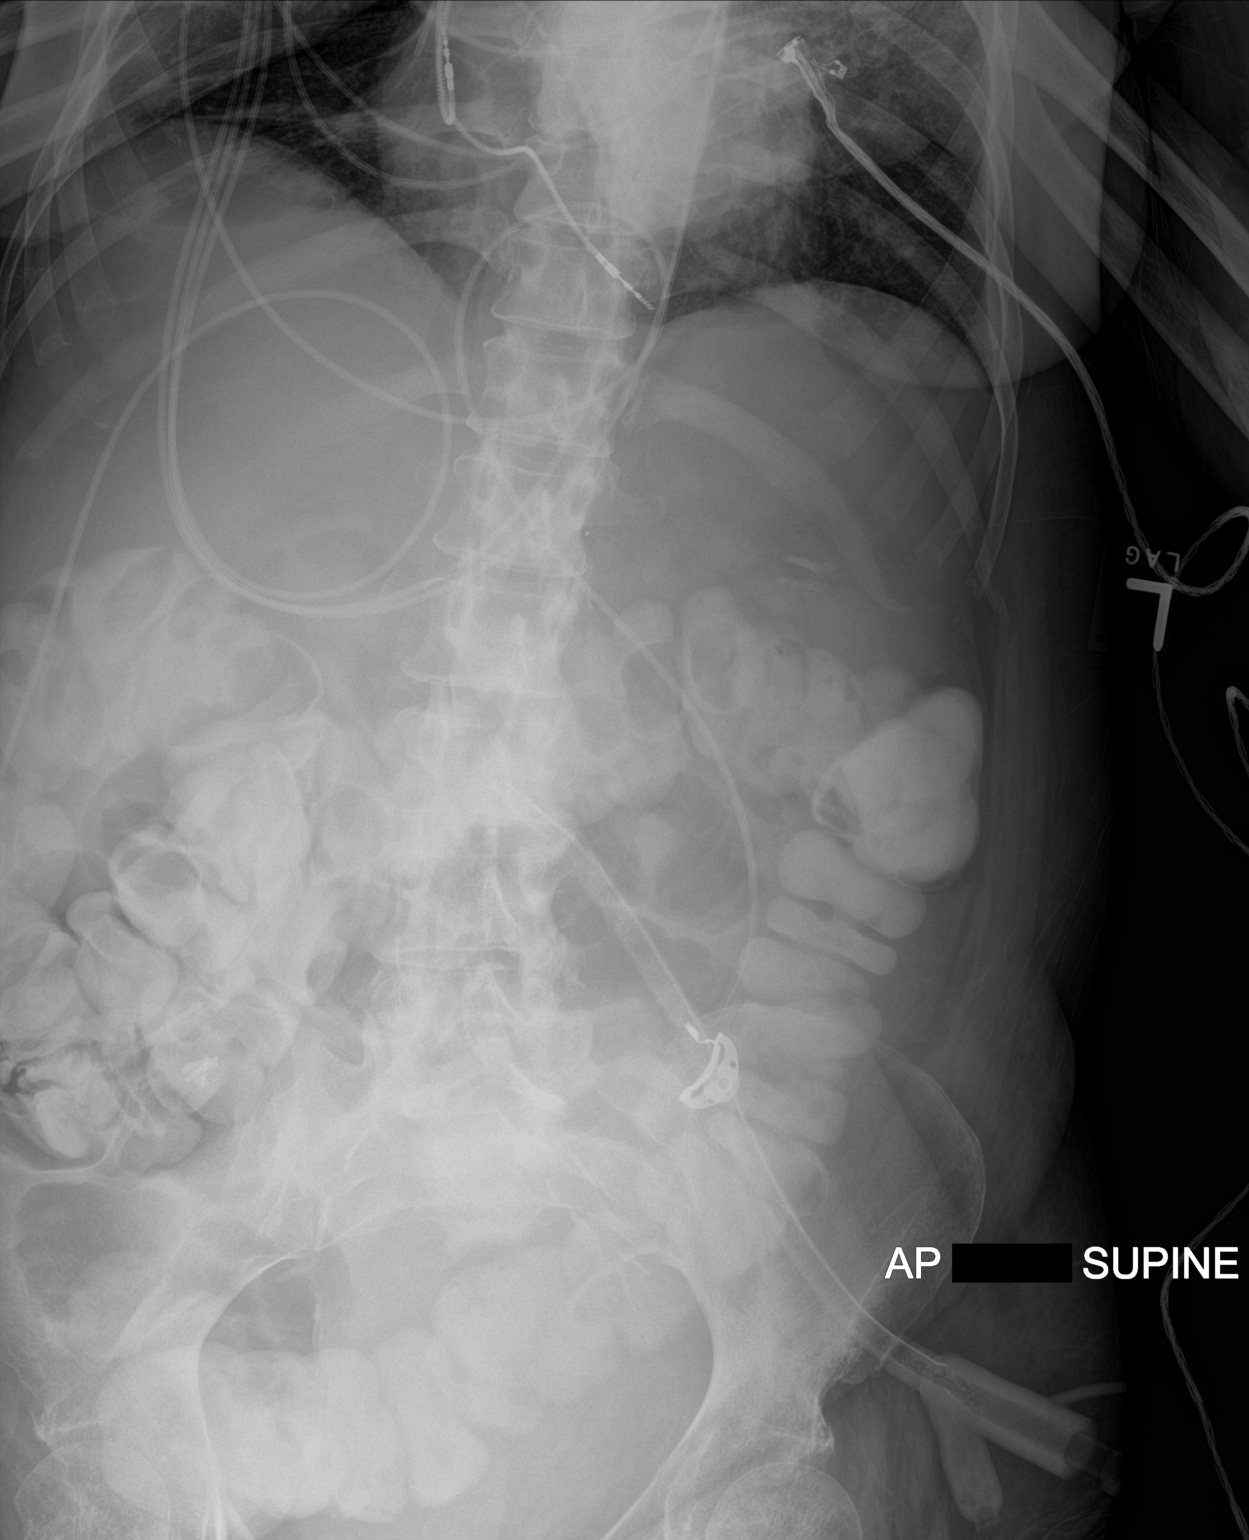

[1 of 1 positions shown; findings below may reference images not displayed]

FINDINGS: Most of the contrast has now passed into the colon. There are some
persistent air-filled loops of small bowel but no significant
distension. No free air. Stable position of the feeding gastrostomy
tube.
IMPRESSION: Interval resolution of small-bowel obstruction with passage of
contrast into the colon.

## 2020-09-22 IMAGING — DX PORTABLE CHEST - 1 VIEW
1 series · 1 of 1 positions shown · non-contrast
Comparison: Nine days ago

CLINICAL DATA: Low-grade fever

EXAM:
PORTABLE CHEST 1 VIEW

[chest ap]
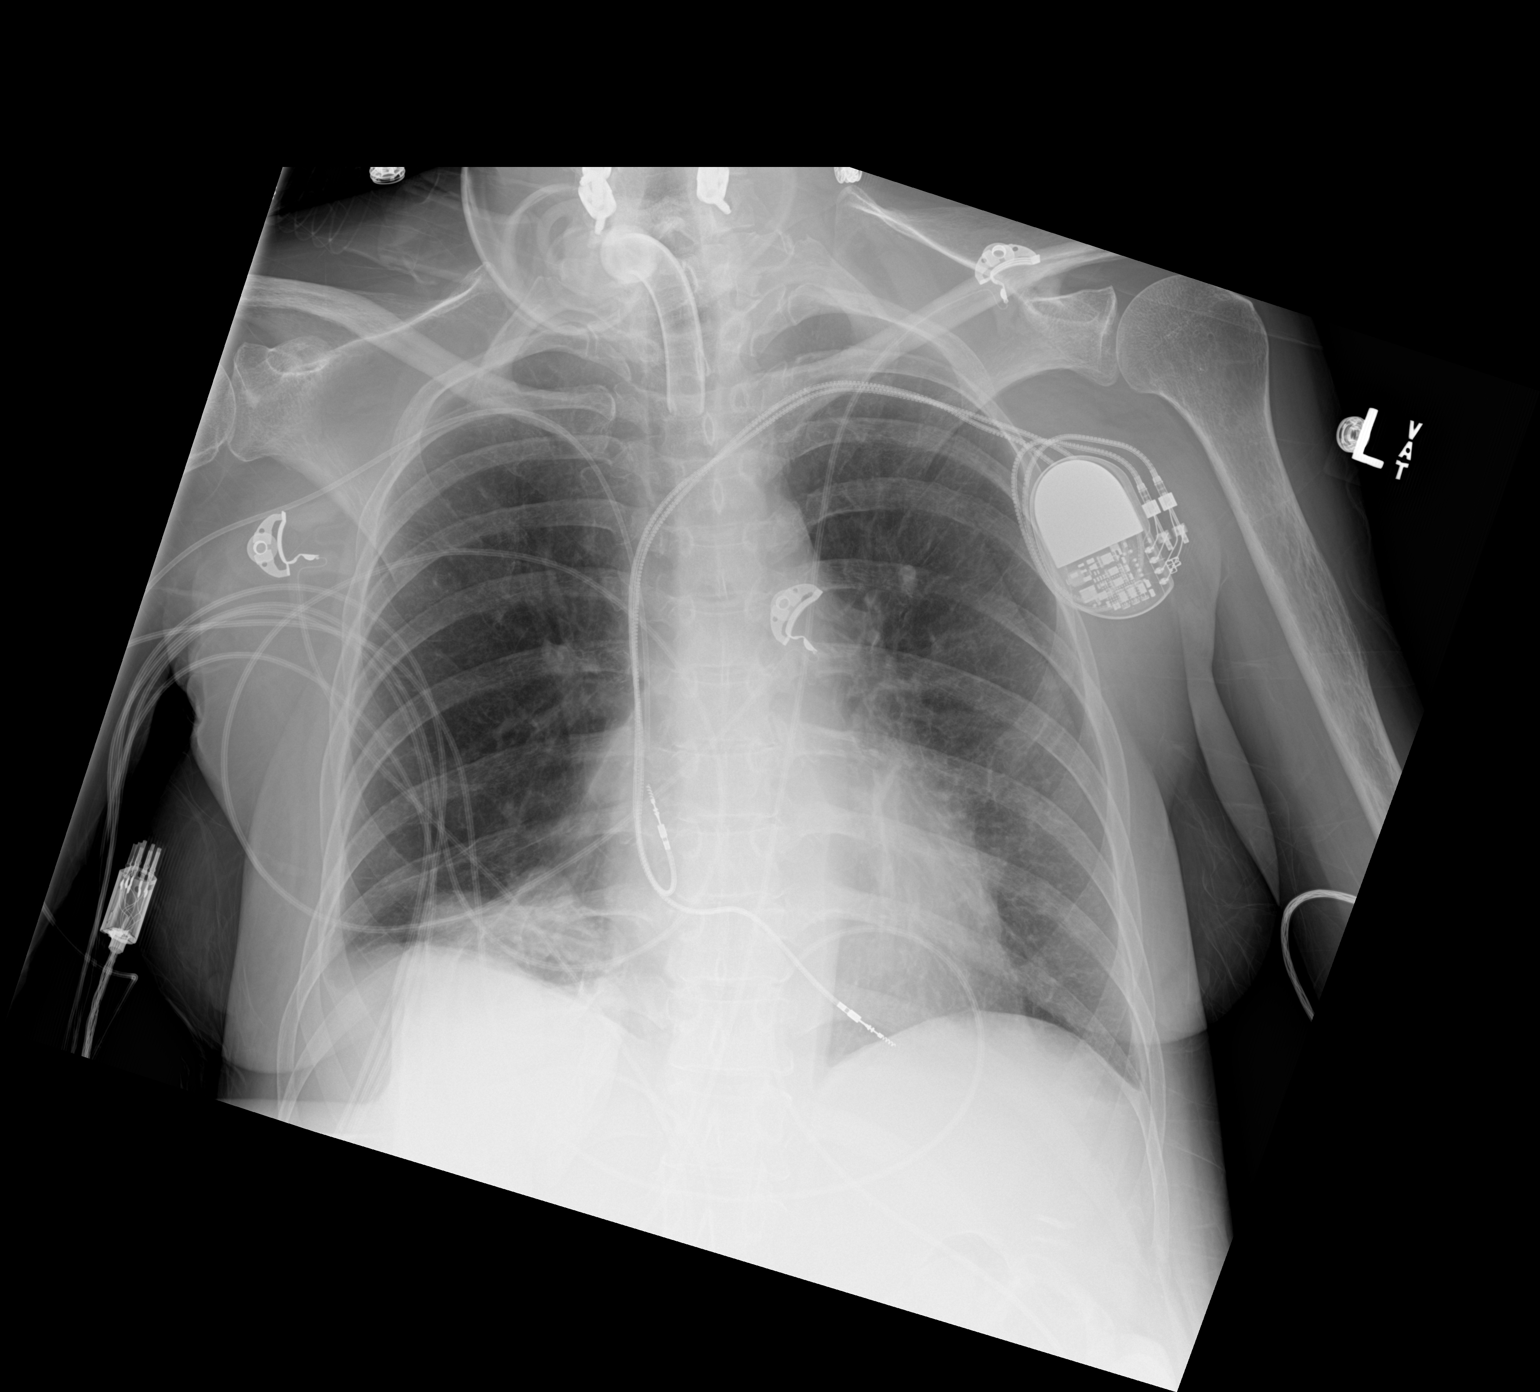

[1 of 1 positions shown; findings below may reference images not displayed]

FINDINGS: Tracheostomy tube in place. Right PICC with tip at the distal SVC.
Dual-chamber pacer leads from the left in stable position.

Patient's right hand overlaps the right lung base. There is no
edema, consolidation, effusion, or pneumothorax. Normal heart size
and mediastinal contours.
IMPRESSION: No evidence of pneumonia or other acute process.

## 2020-09-25 IMAGING — DX PORTABLE ABDOMEN - 1 VIEW
2 series · 2 of 2 positions shown · non-contrast
Comparison: 08/18/2018

CLINICAL DATA: Pt is c/o pain to site of gastric tube as well as
generalized pain to left lower abdomen. H/o quadriplegia, best
obtainable images.

EXAM:
PORTABLE ABDOMEN - 1 VIEW

[abdomen kub (1 of 2)]
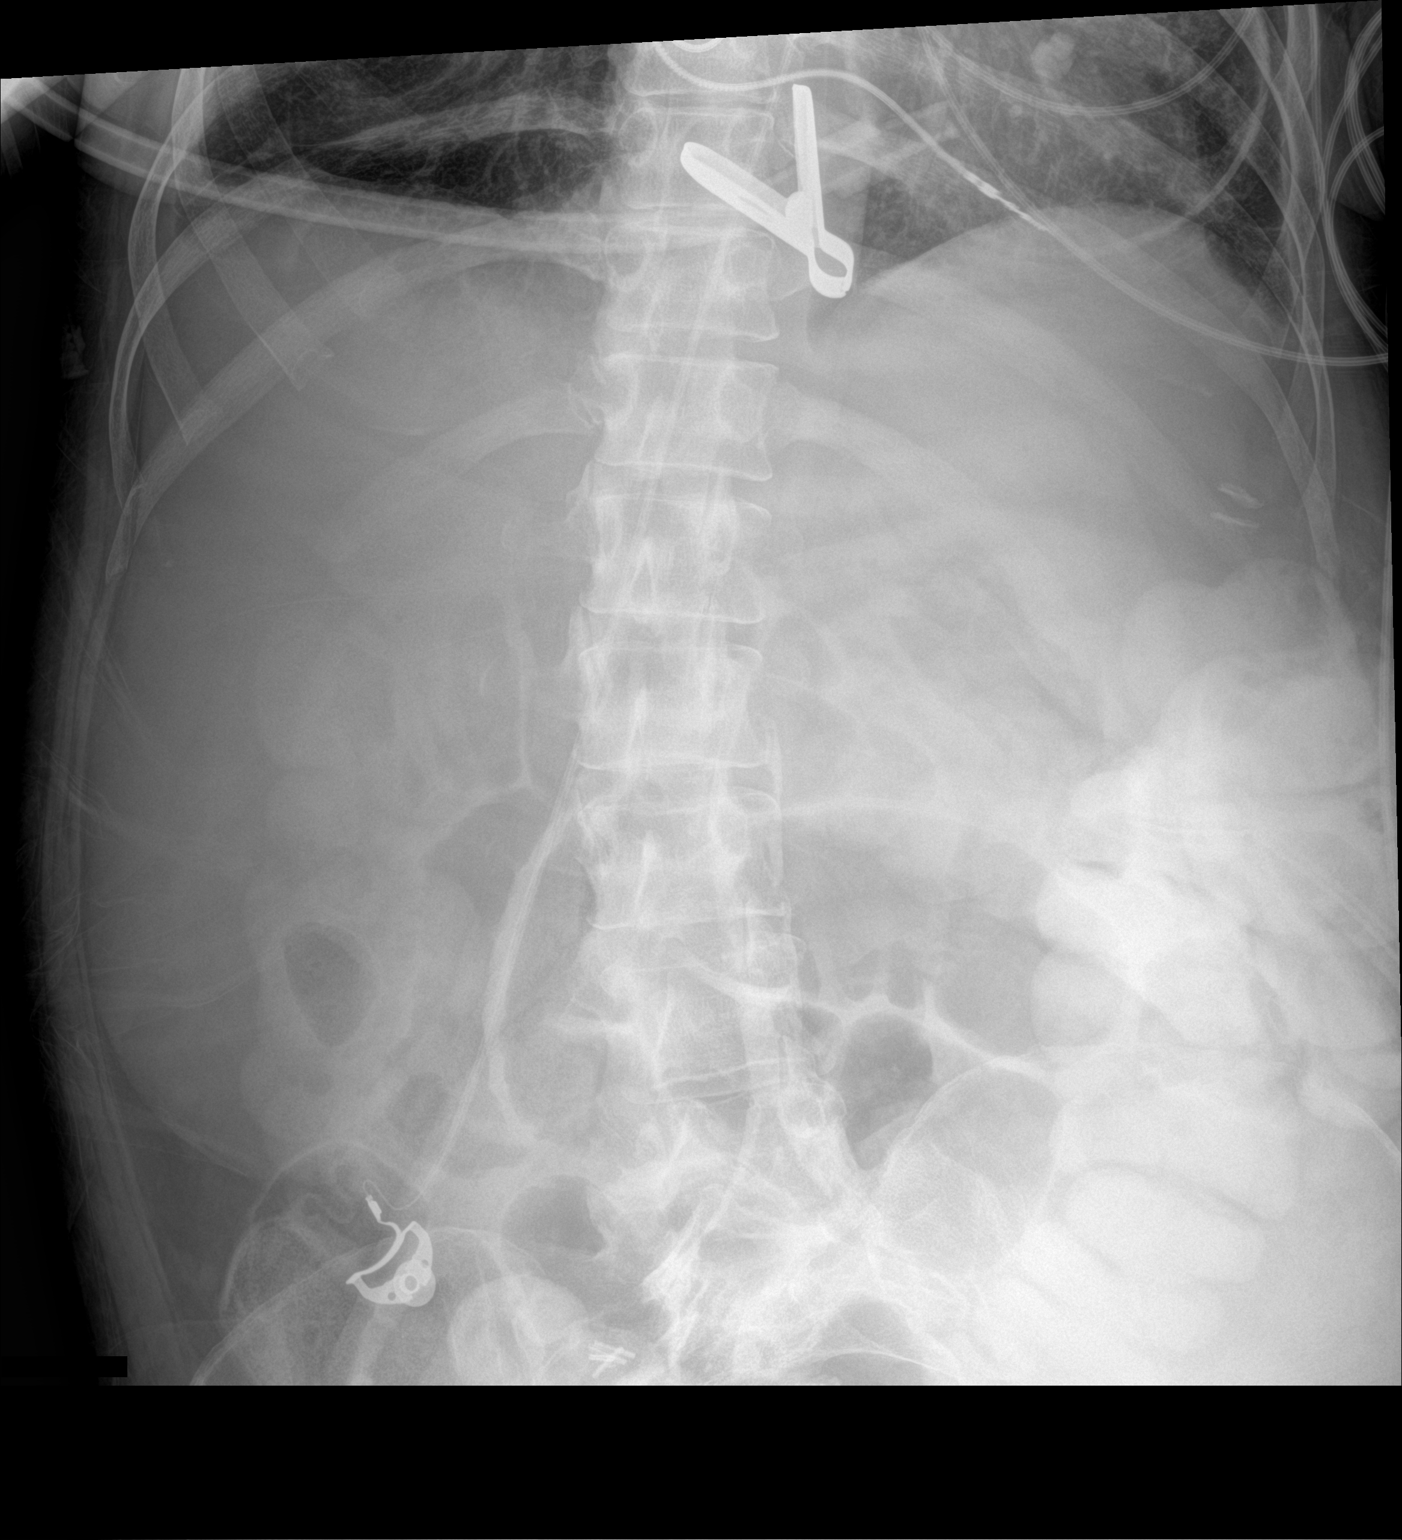

[abdomen kub (2 of 2)]
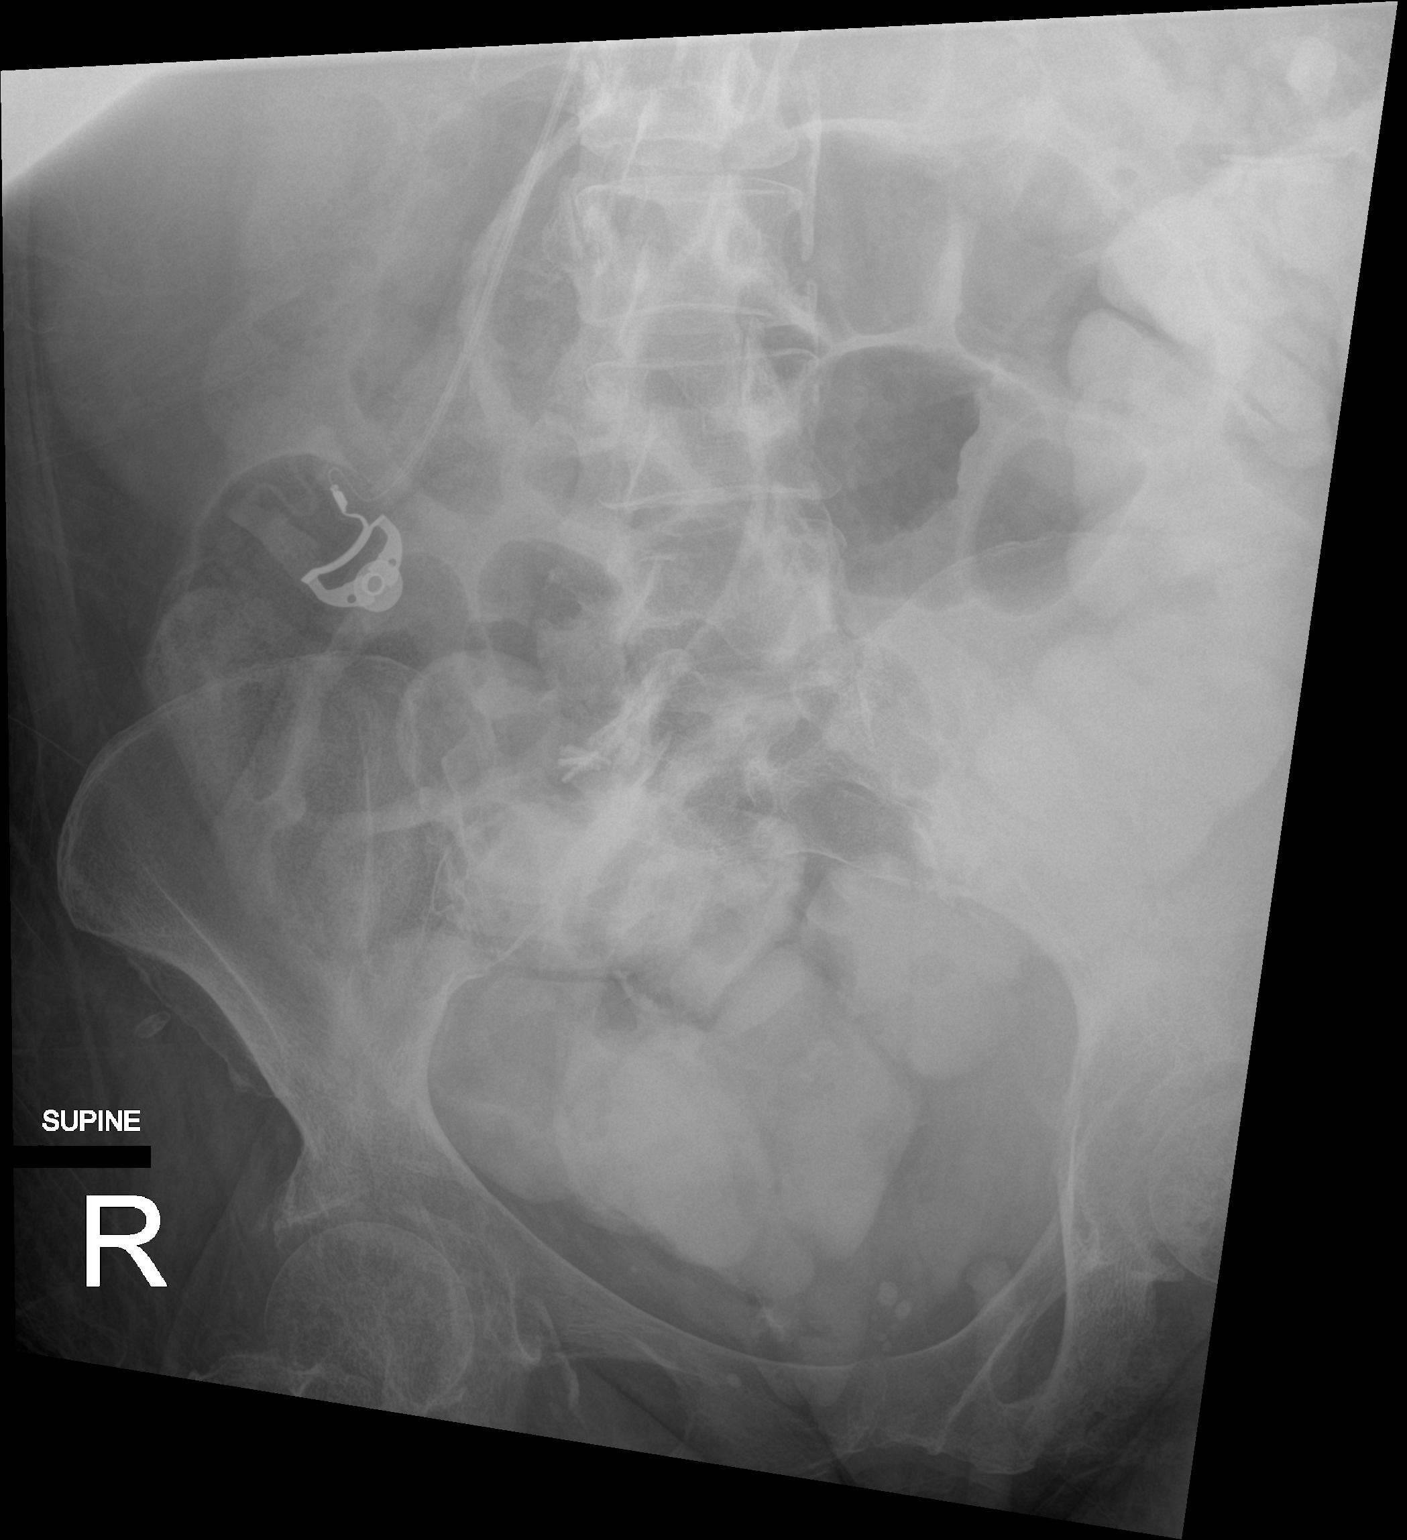

[2 of 2 positions shown; findings below may reference images not displayed]

FINDINGS: Residual contrast noted in the colon. No evidence of bowel
obstruction.

Gastrostomy tube seen projecting along the left mid abdomen. Given
the patient's positioning, this may reside in the stomach, but this
is not conclusive.
IMPRESSION: 1. No evidence of bowel obstruction.
2. Gastrostomy tube position cannot be confirmed within the stomach
based on the provided images.

## 2020-11-24 IMAGING — CT CT ABD-PELV W/O CM
2 of 4 series · 15 of 46 positions shown, 17 images · non-contrast
Comparison: CT of the abdomen pelvis dated 08/10/2018.

CLINICAL DATA: 48-year-old female with abdominal distention.
Patient reported that her PEG tube came out and started to leak.

EXAM:
CT ABDOMEN AND PELVIS WITHOUT CONTRAST
TECHNIQUE: Multidetector CT imaging of the abdomen and pelvis was performed
following the standard protocol without IV contrast.

[Series 2: axial st · axial · 0.80mm/px · z∈[-610,-216]mm · 12 of 89 slices shown, 14 images]
[im 5/89  soft-tissue]
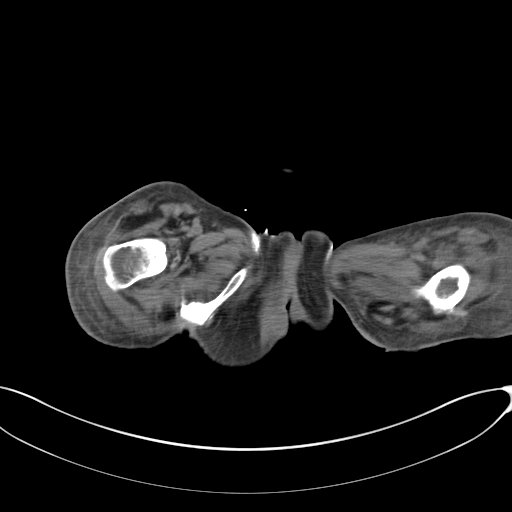
[im 5/89  bone]
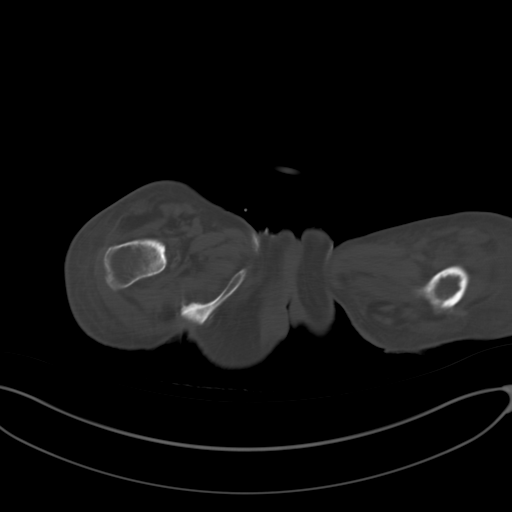
[im 14/89  soft-tissue]
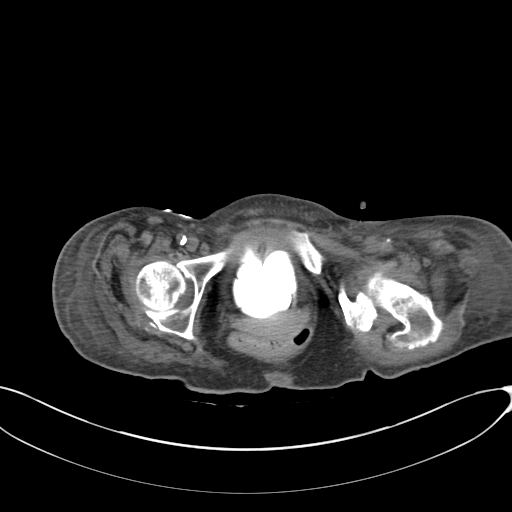
[im 18/89  soft-tissue]
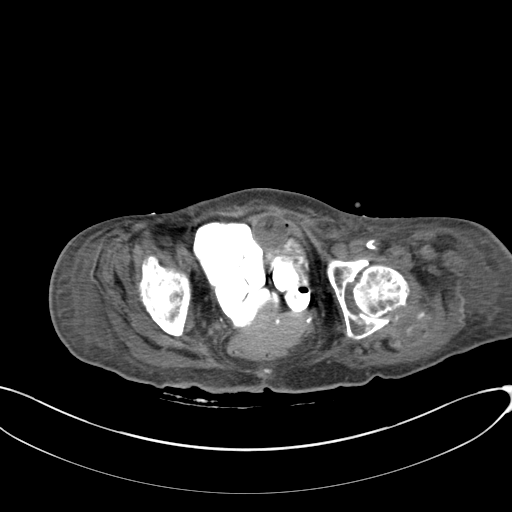
[im 27/89  soft-tissue]
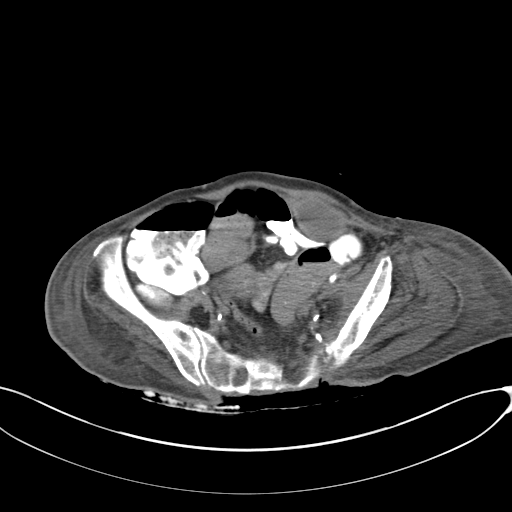
[im 36/89  soft-tissue]
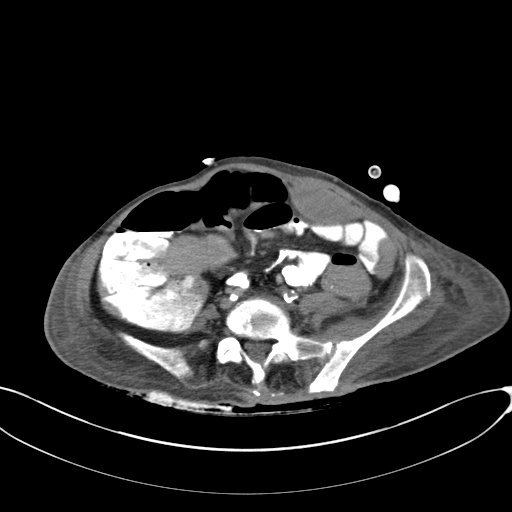
[im 40/89  soft-tissue]
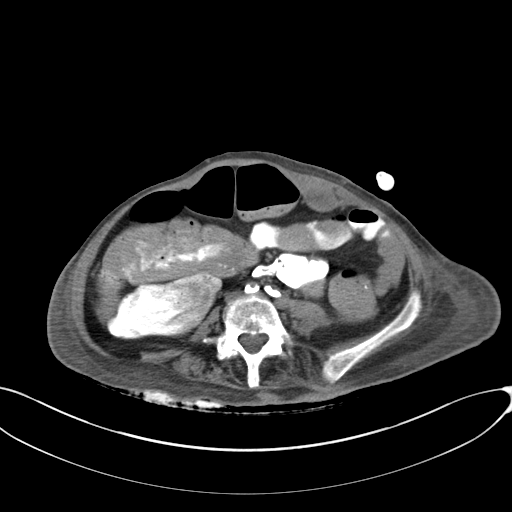
[im 49/89  soft-tissue]
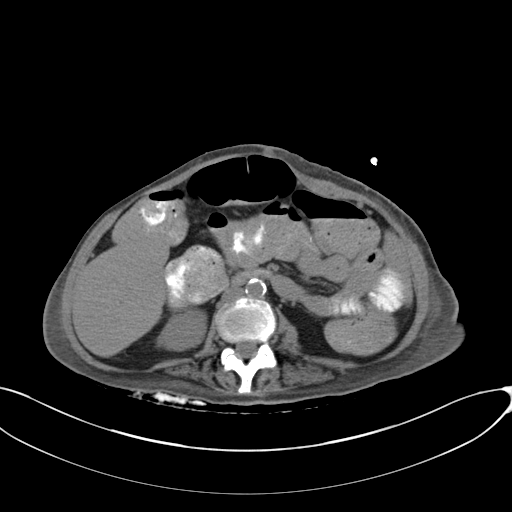
[im 53/89  soft-tissue]
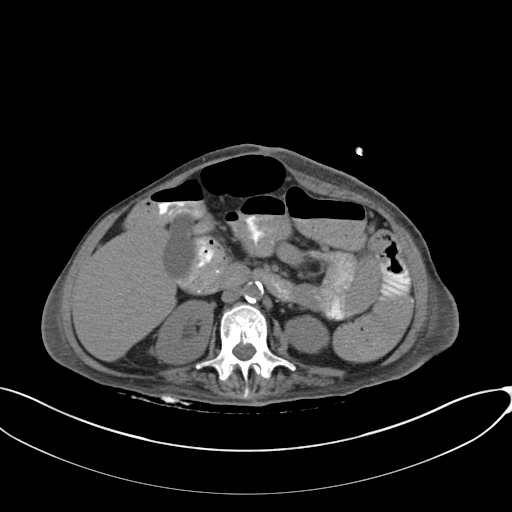
[im 62/89  soft-tissue]
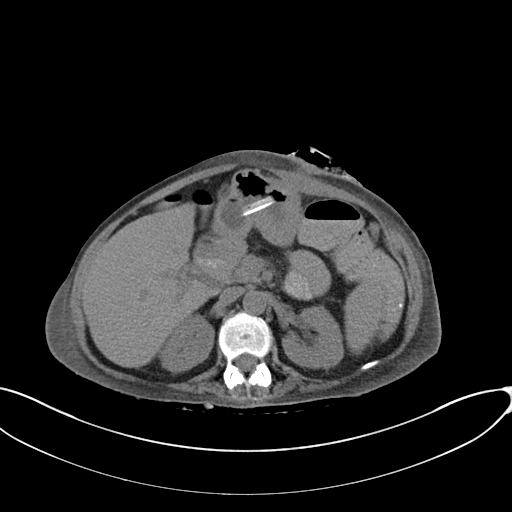
[im 62/89  bone]
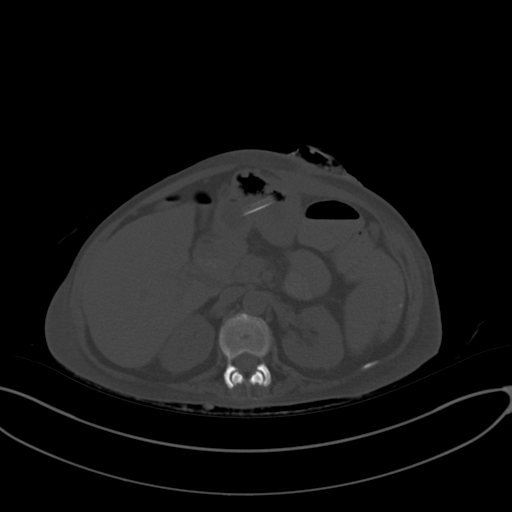
[im 71/89  soft-tissue]
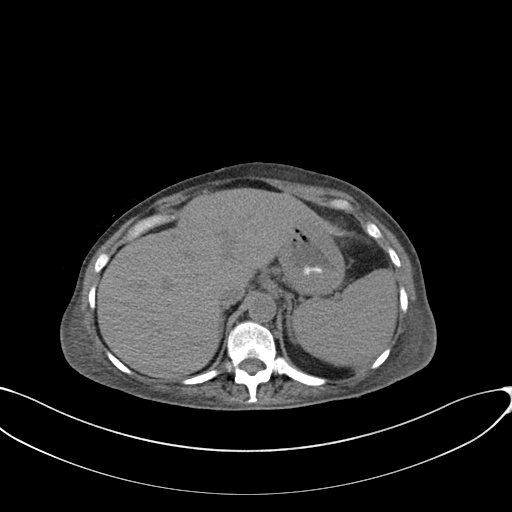
[im 75/89  soft-tissue]
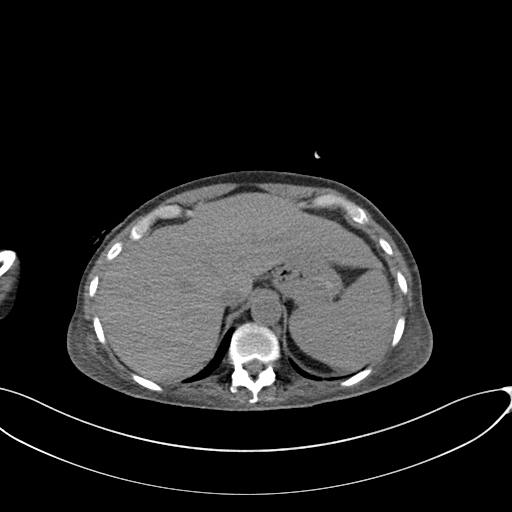
[im 84/89  soft-tissue]
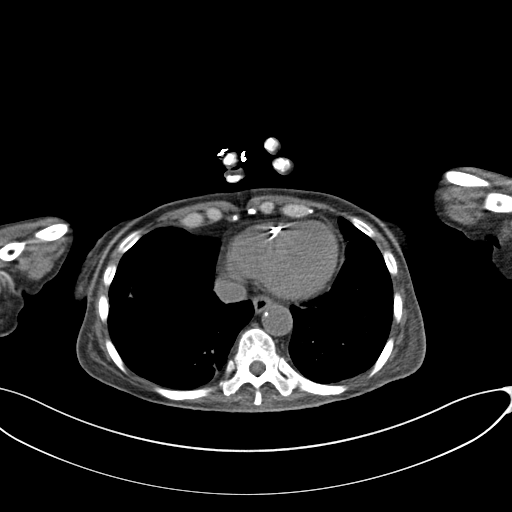

[Series 5: coronal st · coronal · 0.65mm/px · 3 of 109 slices shown]
[im 37/109  soft-tissue]
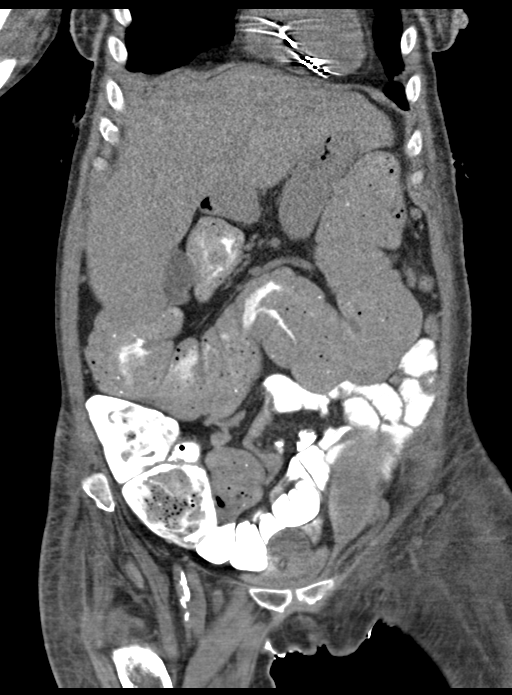
[im 49/109  soft-tissue]
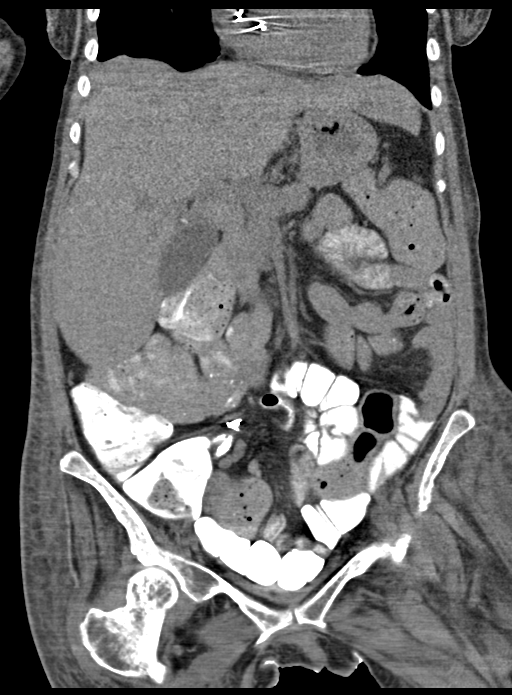
[im 61/109  soft-tissue]
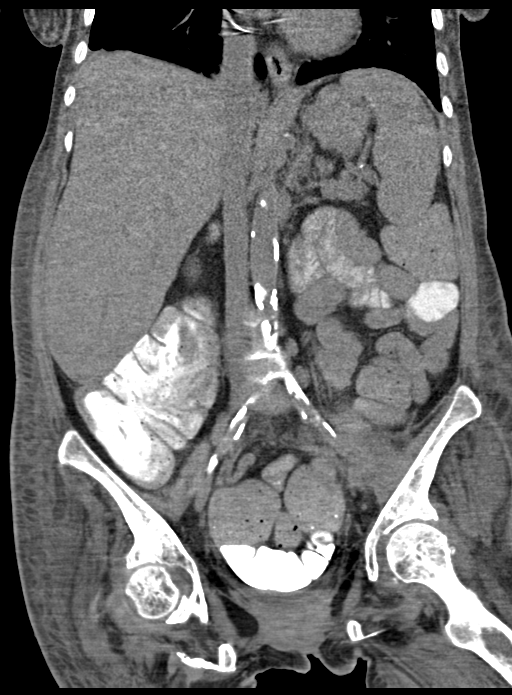

[15 of 46 positions shown; findings below may reference images not displayed]

FINDINGS: Evaluation of this exam is limited in the absence of intravenous
contrast.

Lower chest: Partially visualized right lung base subsegmental and
linear atelectasis/scarring. Diffuse bibasilar interstitial
prominence may represent mild edema. Clinical correlation is
recommended.

No intra-abdominal free air or free fluid.

Hepatobiliary: The liver is grossly unremarkable. No intrahepatic
biliary ductal dilatation. Multiple gallstones. No pericholecystic
fluid or evidence of acute cholecystitis.

Pancreas: The pancreas is grossly unremarkable as visualized.

Spleen: Normal in size without focal abnormality.

Adrenals/Urinary Tract: The adrenal glands are unremarkable. There
is a 5 mm nonobstructing stone in the upper pole the left kidney.
Additional tiny nonobstructing left renal inferior pole calculus is
noted. No hydronephrosis. There is no hydronephrosis or
nephrolithiasis on the right. Punctate calcific focus in the right
hemipelvis (series 2, image 77) appears similar to prior CT, likely
a pelvic phlebolith and less likely distal ureteral calculus. The
urinary bladder is predominantly decompressed around a suprapubic
catheter.

Stomach/Bowel: Percutaneous gastrostomy with balloon in the distal
stomach. Oral contrast is noted throughout the colon mixed with
stool. There is no bowel obstruction or active inflammation.
Appendectomy.

Vascular/Lymphatic: Advanced aortoiliac atherosclerotic disease. No
portal venous gas. There is no adenopathy.

Reproductive: The uterus is not visualized, likely surgically
absent.

Other: Interval decrease in the size of the left rectus sheath
hematoma now measuring approximately 5.1 x 3.2 cm in greatest
transverse axial diameter (previously 12 x 8 cm). There is diffuse
subcutaneous soft tissue edema and anasarca.

Musculoskeletal: Osteopenia with degenerative changes of the spine,
SI joints, and hips. No acute osseous pathology. There is sacral
decubitus ulcer extending close to the posterior sacrum. The distal
sacrum and coccyx have been previously resected. Irregularity and
slight fragmentation of the left SI joint, progressed since the
prior CT. An infectious process involving the left SI joint is not
excluded. MRI may provide better evaluation.
IMPRESSION: 1. No bowel obstruction or active inflammation.
2. Percutaneous gastrostomy with balloon in the distal stomach and
suprapubic catheter within the bladder.
3. Interval decrease in the size of the left rectus sheath hematoma.
4. Cholelithiasis.
5. Sacral decubitus ulcers extending deep to the level of the
posterior sacrum. There is fragmentation and irregularity of the
left SI joint concerning for possibility of an infectious
sacroiliitis. Clinical correlation is recommended. MRI may provide
better evaluation.

Aortic Atherosclerosis ([id number].N).

## 2020-11-24 IMAGING — CR DG ABDOMEN 1V
1 series · 1 of 1 positions shown · IV contrast (omnipaque)
Comparison: Abdominal radiograph dated 08/22/2018.

CLINICAL DATA: Peg tube verification. 40 mL Omnipaque 300 was
injected through the PEG tube.

EXAM:
ABDOMEN - 1 VIEW

[x abdomen supine]
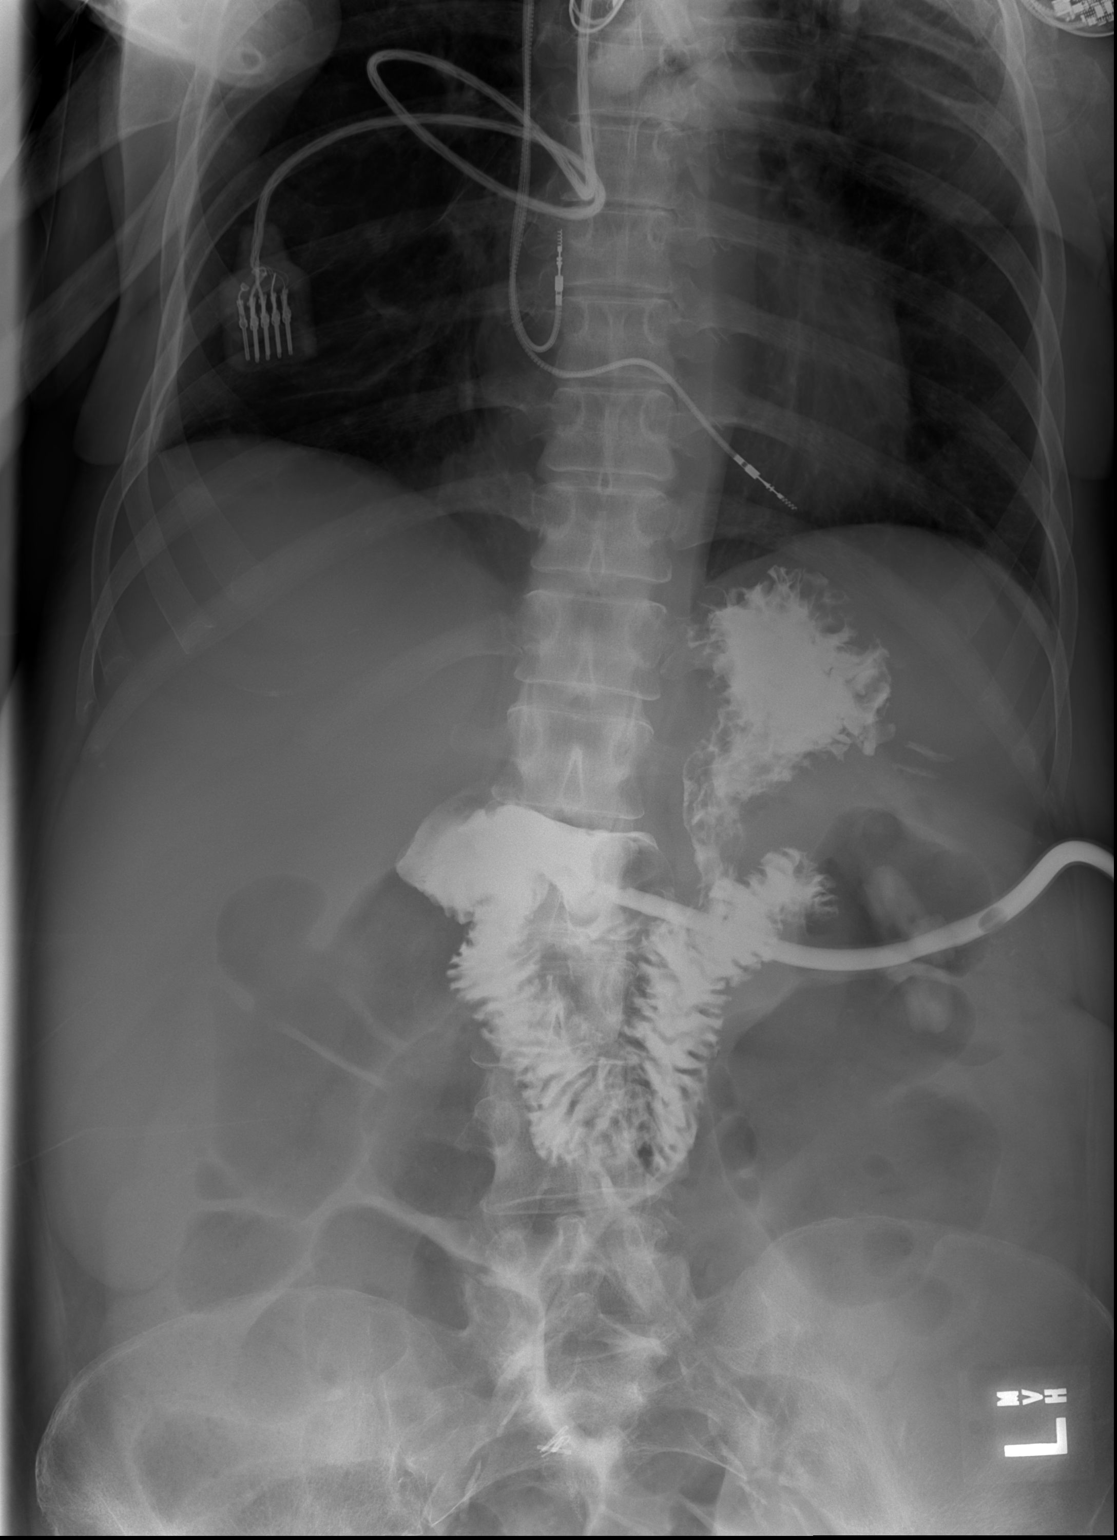

[1 of 1 positions shown; findings below may reference images not displayed]

FINDINGS: Contrast is seen within the stomach and duodenum. The percutaneous
gastrostomy tube terminates in the gastric antrum. The bowel gas
pattern is normal. Surgical clips overlie the pelvis. A cardiac
device is partially imaged.
IMPRESSION: Gastrostomy tube terminating in the stomach.

## 2021-02-15 IMAGING — CT CT ABD-PELV W/ CM
2 of 5 series · 14 of 46 positions shown, 16 images · IV contrast (APPLIED)
Comparison: October 21, 2018

CLINICAL DATA: Osteomyelitis elevated alk-phos

EXAM:
CT ABDOMEN AND PELVIS WITH CONTRAST
TECHNIQUE: Multidetector CT imaging of the abdomen and pelvis was performed
using the standard protocol following bolus administration of
intravenous contrast.
CONTRAST:  80mL OMNIPAQUE IOHEXOL 300 MG/ML  SOLN

[Series 3: abd/ pelvis 5.0 i30f 2 · axial · 0.87mm/px · z∈[+738,+1118]mm · 11 of 86 slices shown, 13 images]
[im 5/86  soft-tissue]
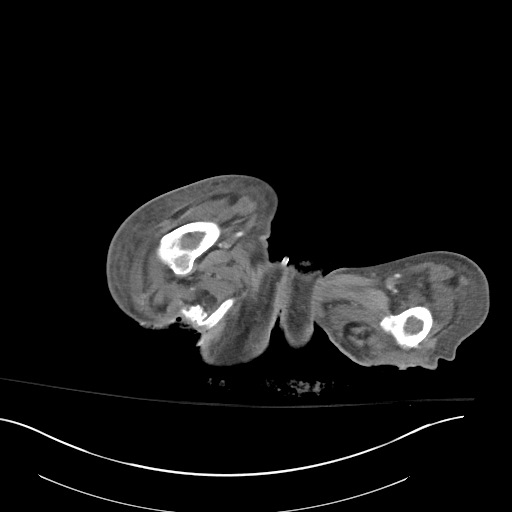
[im 5/86  bone]
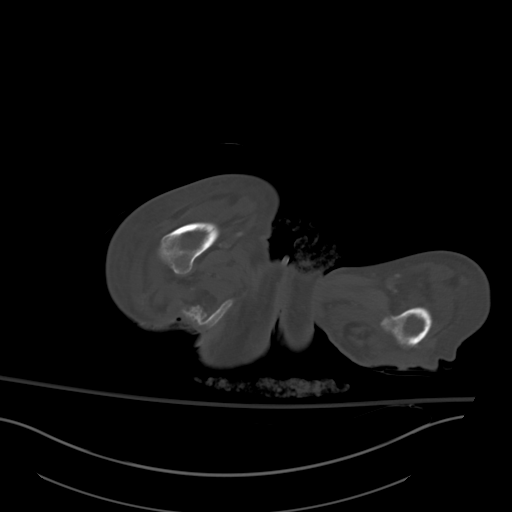
[im 13/86  soft-tissue]
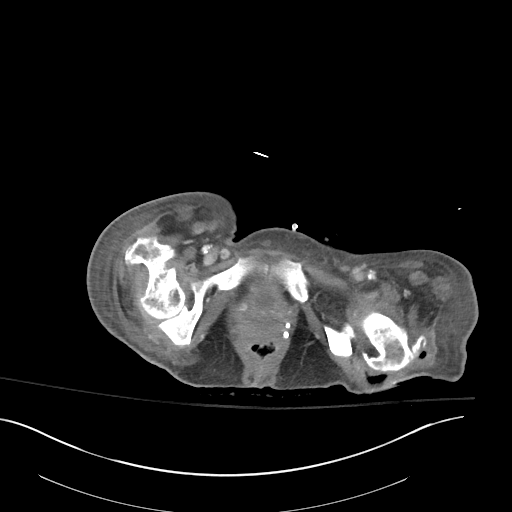
[im 22/86  soft-tissue]
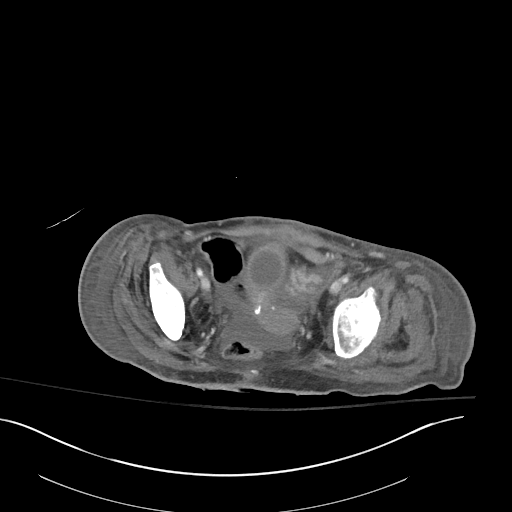
[im 30/86  soft-tissue]
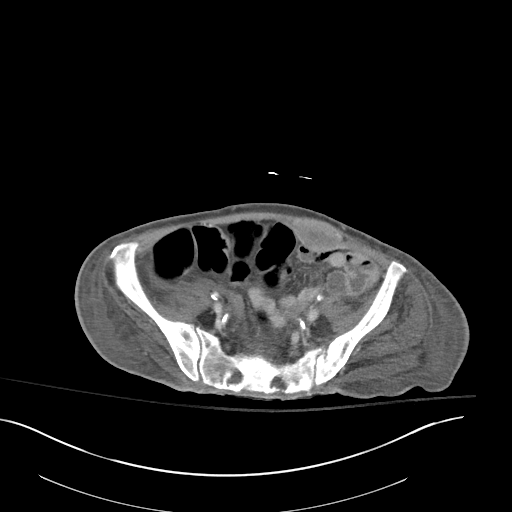
[im 35/86  soft-tissue]
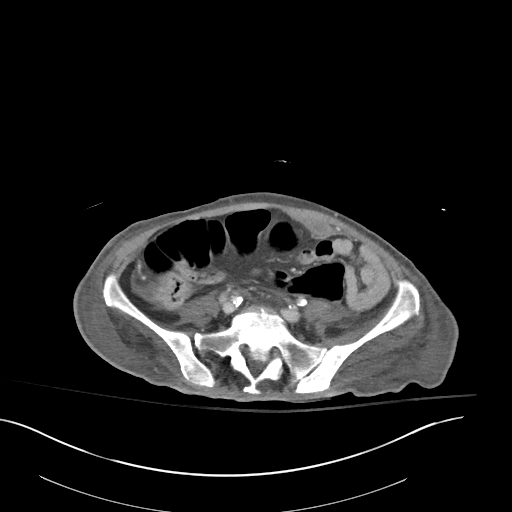
[im 43/86  soft-tissue]
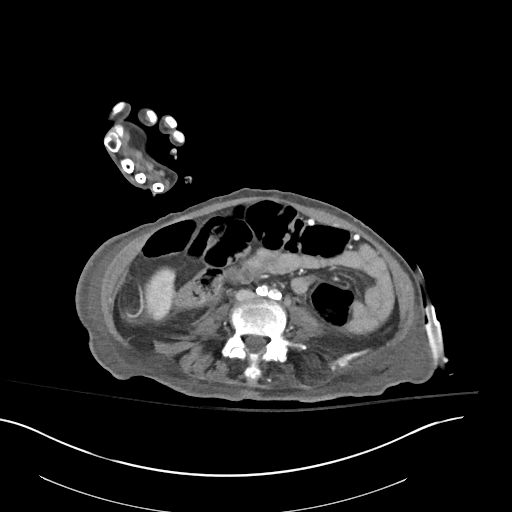
[im 52/86  soft-tissue]
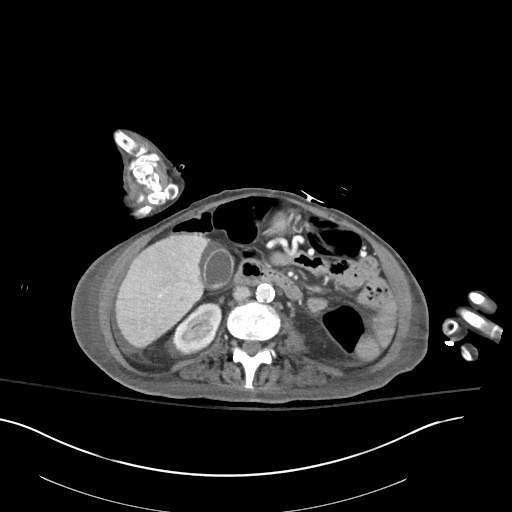
[im 56/86  soft-tissue]
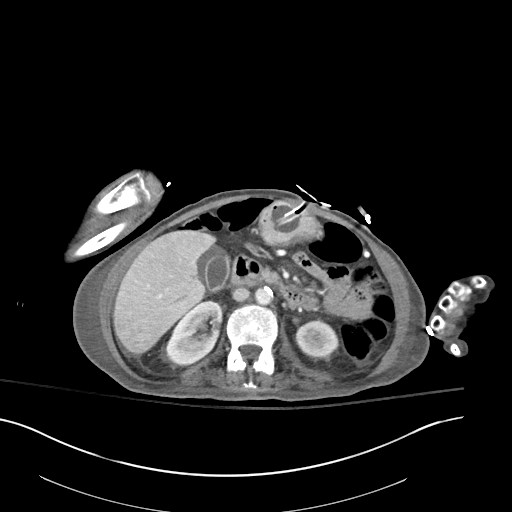
[im 64/86  soft-tissue]
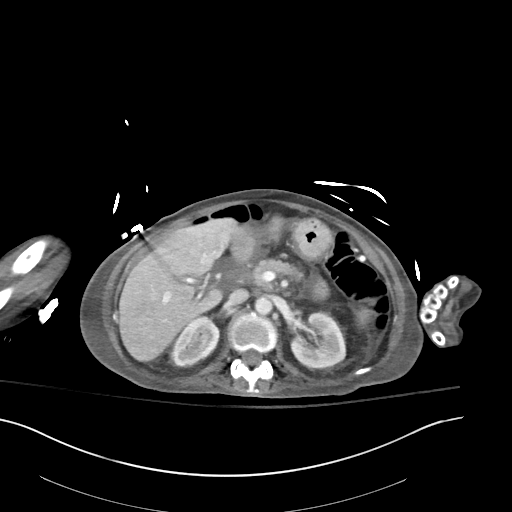
[im 64/86  bone]
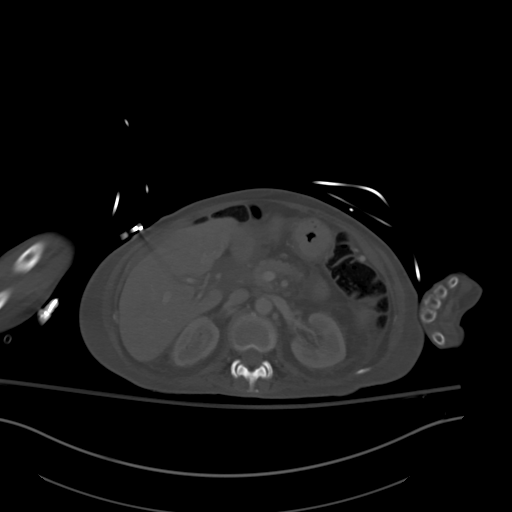
[im 73/86  soft-tissue]
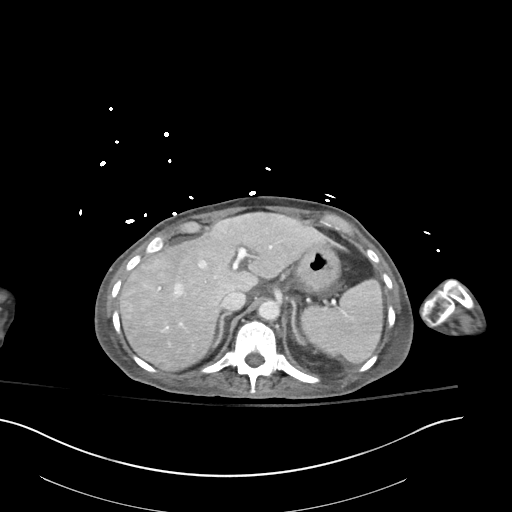
[im 81/86  soft-tissue]
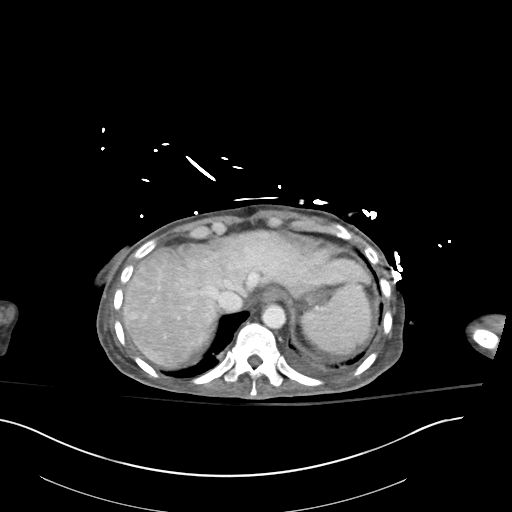

[Series 6: coronal soft tissue · coronal · 0.74mm/px · 3 of 74 slices shown]
[im 25/74  soft-tissue]
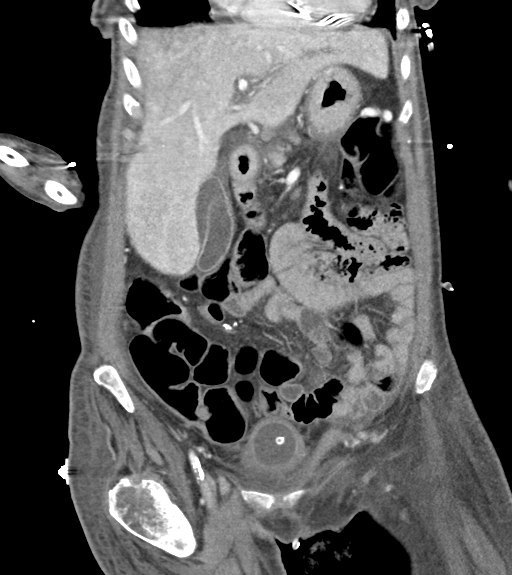
[im 33/74  soft-tissue]
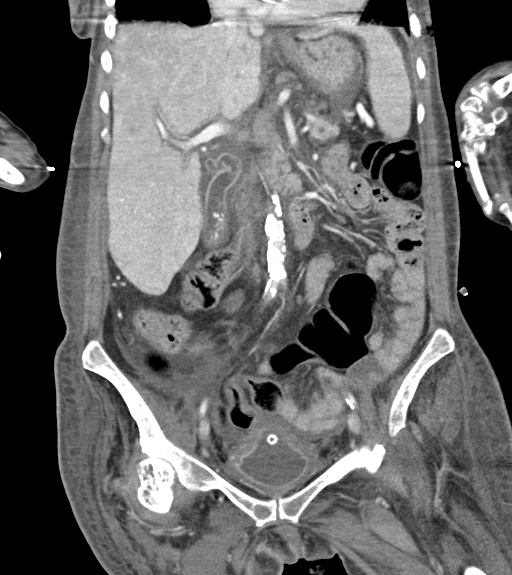
[im 41/74  soft-tissue]
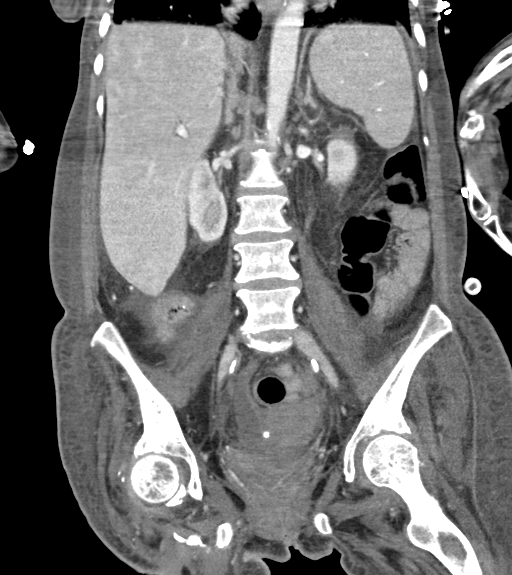

[14 of 46 positions shown; findings below may reference images not displayed]

FINDINGS: Lower chest: The visualized heart size within normal limits. No
pericardial fluid/thickening. Pacemaker lead tips are seen.

No hiatal hernia.

There is a small left pleural effusion. Patchy/streaky airspace
opacities seen at the right lung base.

Hepatobiliary: There is heterogeneous parenchymal enhancement
throughout the liver. Mild intrahepatic biliary ductal dilatation
seen. There is hyperenhancement of the gallbladder wall with
layering gallstones and significant surrounding pericholecystic
fluid and fat stranding changes extending perihepatic and into the
right pericolonic gutter.

Pancreas: Unremarkable. No pancreatic ductal dilatation or
surrounding inflammatory changes.

Spleen: Normal in size without focal abnormality.

Adrenals/Urinary Tract: Both adrenal glands appear normal. There is
a 7 mm calculus seen within the upper pole of the left kidney as on
prior exam. Mild bilateral perinephric stranding is seen. No
hydronephrosis is noted. There is diffuse bladder wall thickening.
There is a suprapubic catheter which appears to protrude through the
posterior bladder wall. Within the deep pelvis adjacent to the
suprapubic catheter there is a small amount of fluid within the deep
pelvis.

Stomach/Bowel: There is diffuse wall thickening of the distal
stomach/pylorus with surrounding mesenteric fat stranding changes
seen. A percutaneous gastrotomy tube is seen projecting over the mid
body of the stomach. The small bowel and colon are unremarkable. The

Vascular/Lymphatic: Scattered aortic atherosclerotic calcifications
are seen without aneurysmal dilatation.

Reproductive: The uterus and adnexa are unremarkable.

Other: There is diffuse anasarca. Along the left rectus sheath there
is interval decreased appearance to the probable hematoma measuring
4.3 cm.

Musculoskeletal: There is interval worsening in the ulceration over
the posterior left greater trochanter with further fragmentation of
the greater trochanter cortical irregularity involving the entire
femoroacetabular joint. There is subcutaneous emphysema and non
loculated fluid seen overlying the greater trochanter. There is also
further ulceration of the posterior right ischial tuberosity with a
small focus of subcutaneous emphysema. There is cortical
irregularity and periosteal reaction with erosion at the posterior
ischial tuberosity. There is been a prior resection of the distal
sacrum and coccyx. Superficial ulceration seen at the distal sacral
element with further sclerosis and cortical irregularity at the for
resection margins.
IMPRESSION: 1. Small left pleural effusion and patchy atelectasis at the right
lung base.
2. Findings which could be suggestive of acute cholecystitis with
hyperenhancement of the gallbladder wall, pericholecystic fluid and
pericholecystic inflammatory changes. Inflammatory changes extend
around the liver and into the right pericolic gutter. If further
evaluation is required, would recommend right upper quadrant
ultrasound.
3. Diffuse gastric wall thickening of the distal stomach/pylorus
which could be due to mild gastritis.
4. Suprapubic catheter which appears to protrude through the
posterior bladder wall with fluid in the deep pelvis which could
represent urinary leak or ascites.
5. Findings suggestive of chronic cystitis.
6. Further worsening in the large area of ulceration over the
posterior left greater trochanter with progression of chronic
osteomyelitis and fragmentation of the greater trochanter and left
hip. There is adjacent non loculated fluid in subcutaneous emphysema
which could represent phlegmon/early abscess.
7. Progression of the ulceration over the right ischial tuberosity
with chronic osteomyelitis.
8. Focal sacral decubitus ulcer with sclerosis at the resection
margin of the distal sacrum, likely progression of chronic
osteomyelitis.
9. Diffuse anasarca
10. Resolving left rectus sheath

1. These results were called by telephone at the time of
interpretation on 01/12/2019 at [DATE] to provider DX
ARISSA , who verbally acknowledged these results.

## 2021-02-15 IMAGING — DX DG TIBIA/FIBULA 2V*L*
4 series · 4 of 4 positions shown · non-contrast
Comparison: Portable exam 3592 hours without priors for comparison

CLINICAL DATA: Altered mental status, lethargy, sleeping, infection

EXAM:
LEFT TIBIA AND FIBULA - 2 VIEW

[tibia ap (1 of 2)]
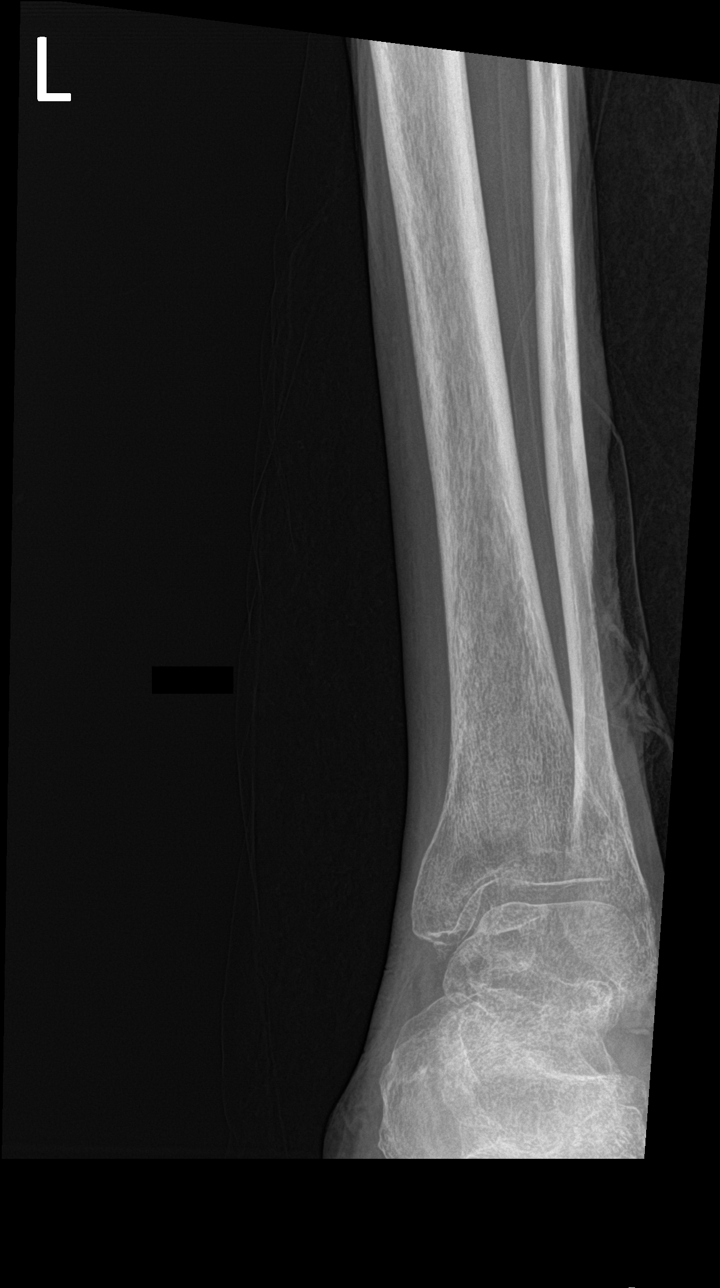

[tibia ap (2 of 2)]
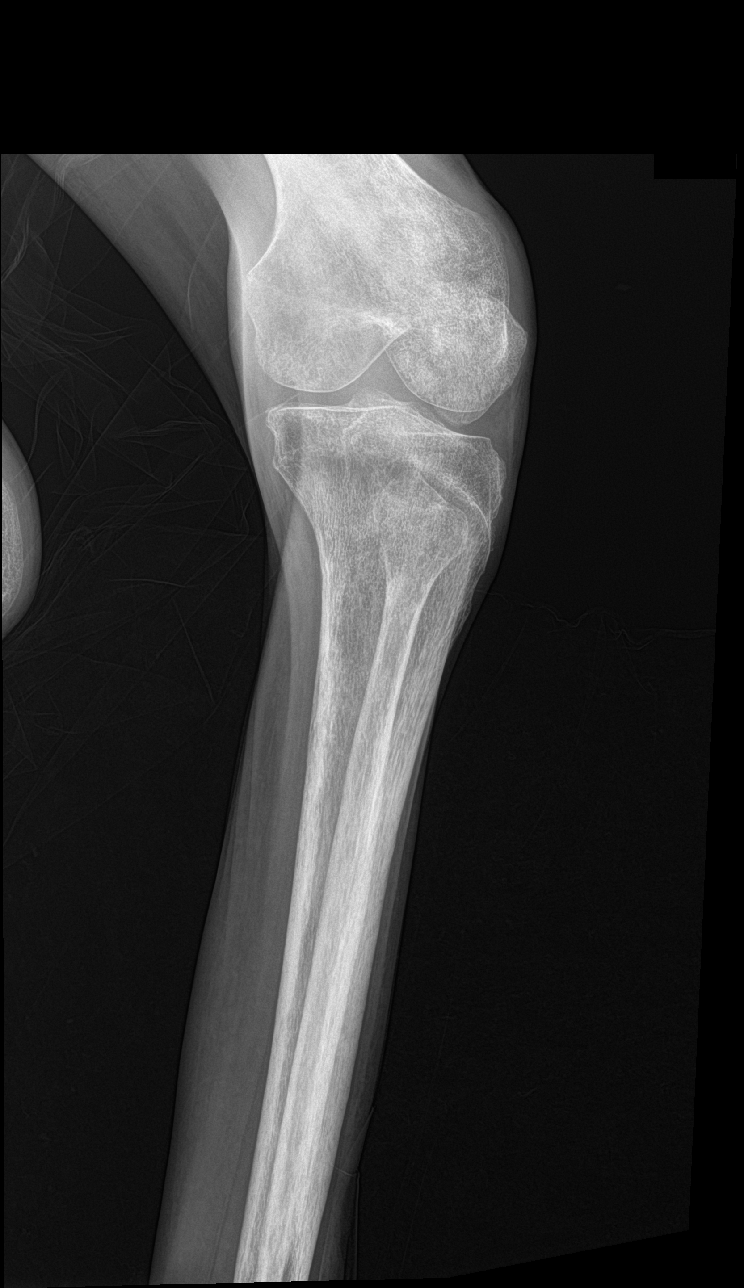

[tibia lat (1 of 2)]
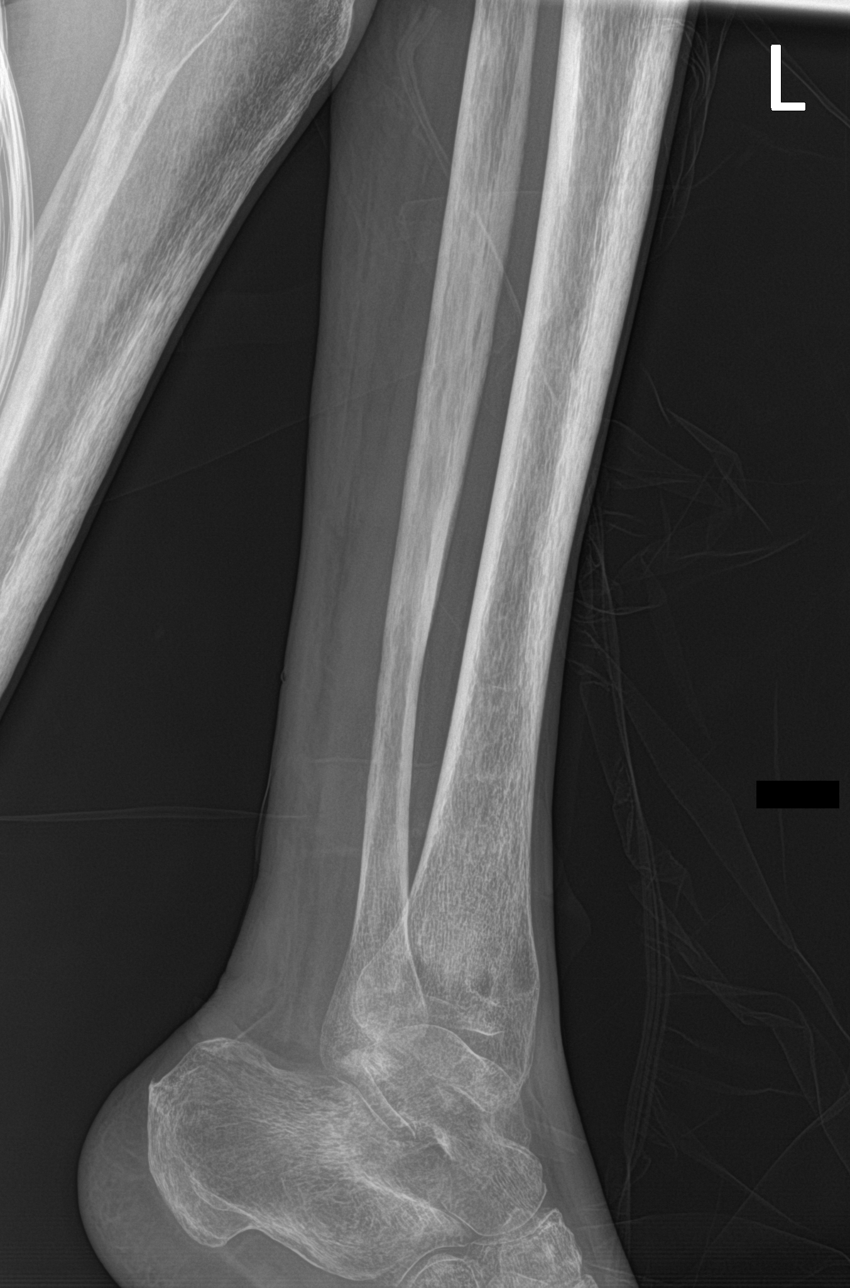

[tibia lat (2 of 2)]
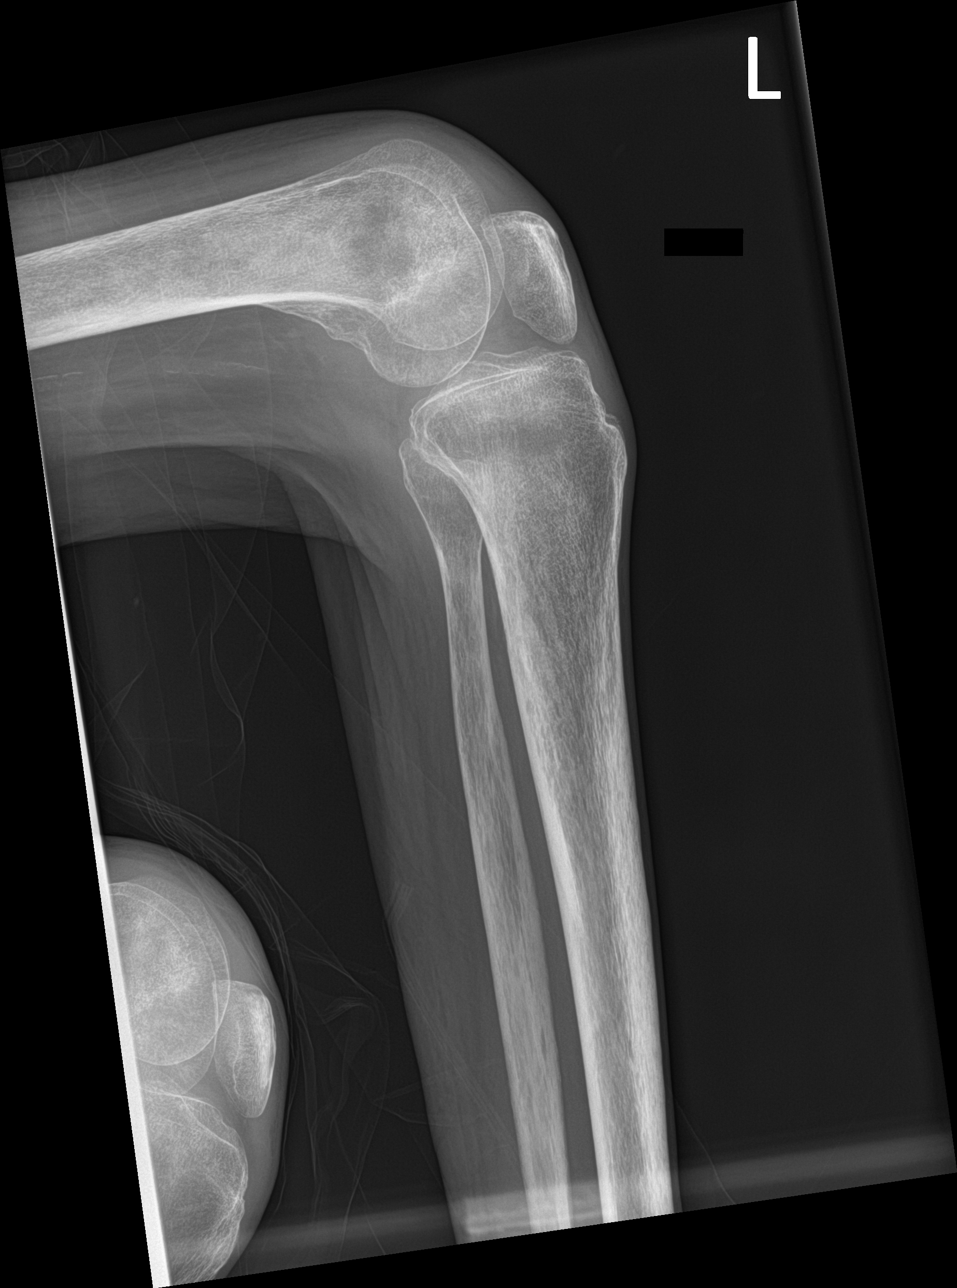

[4 of 4 positions shown; findings below may reference images not displayed]

FINDINGS: Diffuse osseous demineralization.

Knee and ankle joint alignments normal.

No acute fracture or dislocation.

Soft tissue irregularity at the lateral aspect of lower LEFT leg mid
to distally.

Associated cortical destruction of the lateral distal fibula
consistent with osteomyelitis.
IMPRESSION: Osseous demineralization with cortical destruction of the lateral
margin of the mid to distal LEFT fibula consistent with
osteomyelitis.

Findings called to Dr. Haji Murtaza on 01/12/2019 at 4401 hours.

## 2021-02-15 IMAGING — DX DG CHEST 1V PORT
1 series · 1 of 1 positions shown · non-contrast
Comparison: Portable exam 3434 hours compared to 08/19/2018

CLINICAL DATA: Lethargy, sleeping, multiple source

EXAM:
PORTABLE CHEST 1 VIEW

[chest ap]
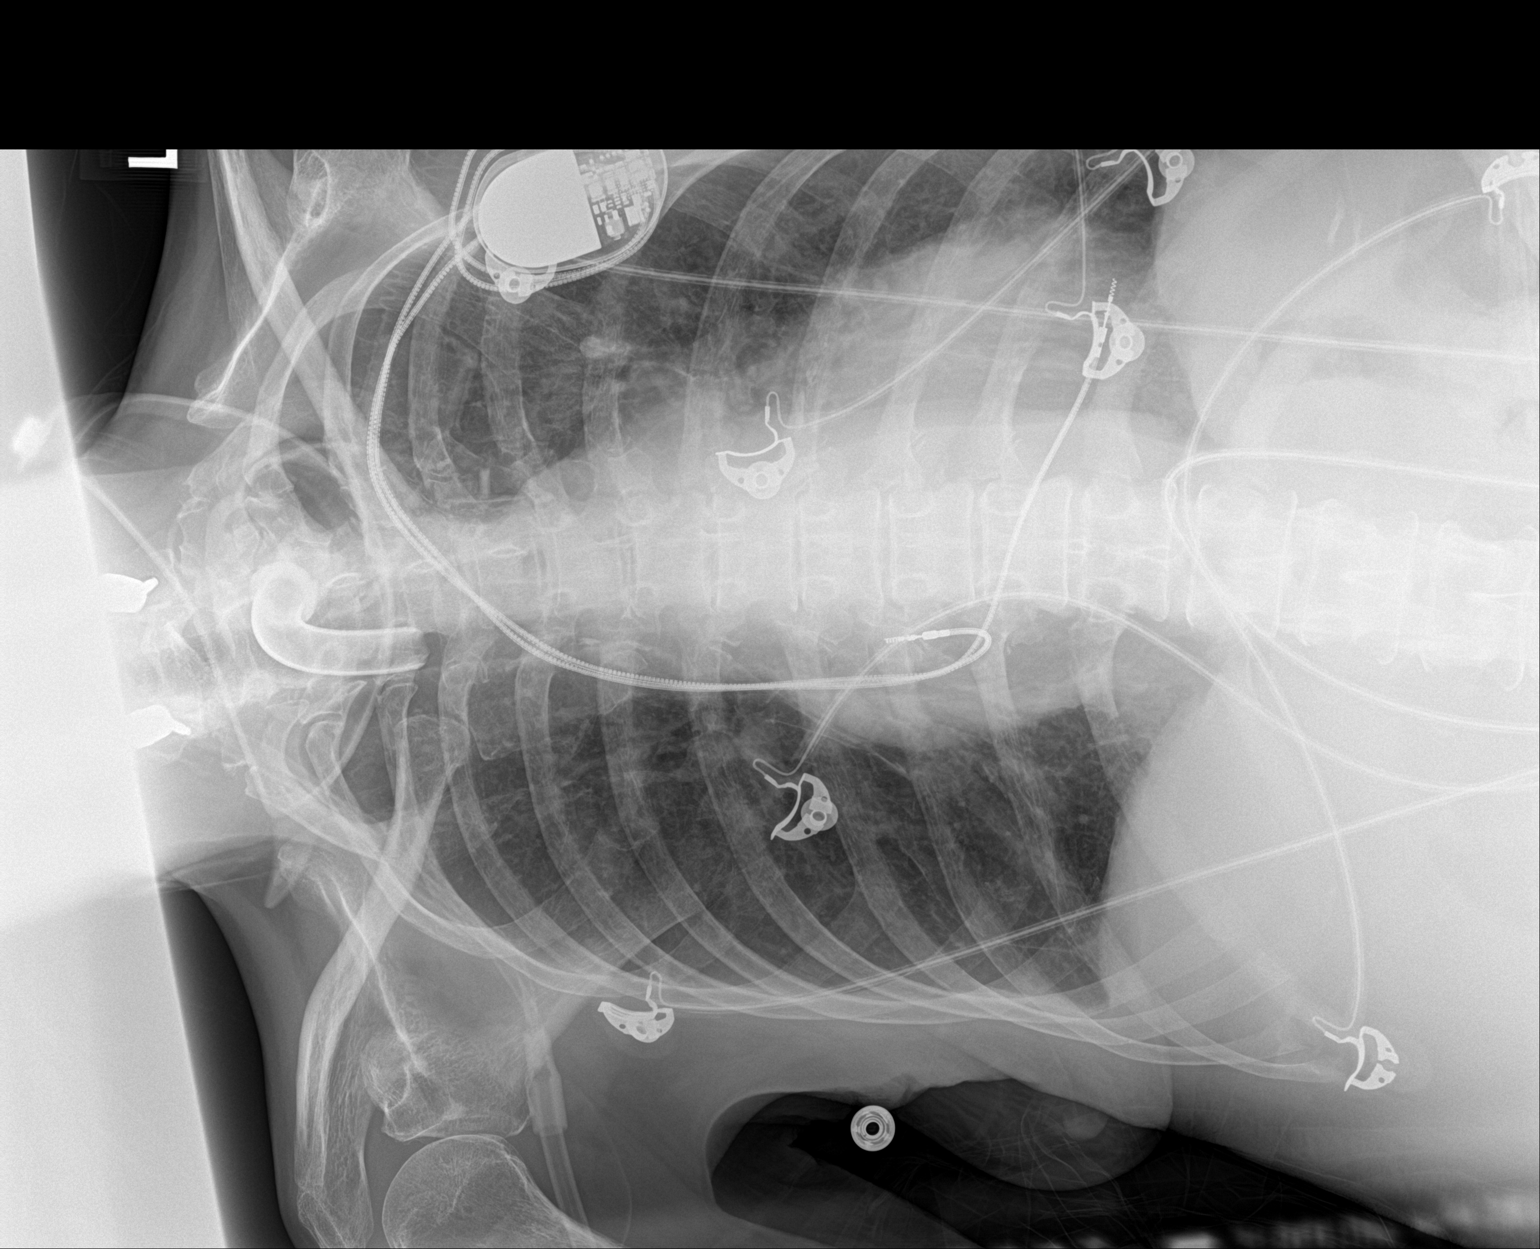

[1 of 1 positions shown; findings below may reference images not displayed]

FINDINGS: Tracheostomy tube projects over tracheal air column.

LEFT subclavian sequential transvenous pacemaker leads project over
RIGHT atrium and RIGHT ventricle.

Upper normal heart size.

Mediastinal contours and pulmonary vascularity normal.

Lungs grossly clear.

No infiltrate, pleural effusion or pneumothorax.

Prior cervical spine fusion.
IMPRESSION: No acute abnormalities.

## 2021-02-16 IMAGING — CT CT PELVIS W/ CM
2 of 5 series · 14 of 46 positions shown, 16 images · IV contrast (omnipaque)
Comparison: CT abdomen and pelvis 01/12/2019

CLINICAL DATA: Question bladder perforation, abnormal CT with
suprapubic catheter questionably extending through bladder wall,
history of quadriplegia, diabetes mellitus

EXAM:
CT PELVIS WITH CONTRAST
TECHNIQUE: Multidetector CT imaging of the pelvis was performed using the
standard protocol following the bolus administration of intravenous
contrast. Dilute contrast was instilled retrograde into the urinary
bladder prior to imaging.
CONTRAST:  50mL OMNIPAQUE IOHEXOL 300 MG/ML SOLN mixed with 250 cc
of sterile saline, volume instilled 75 mL.

[Series 6: pelvis thin · axial · 0.64mm/px · z∈[+516,+714]mm · 11 of 371 slices shown, 13 images]
[im 20/371  soft-tissue]
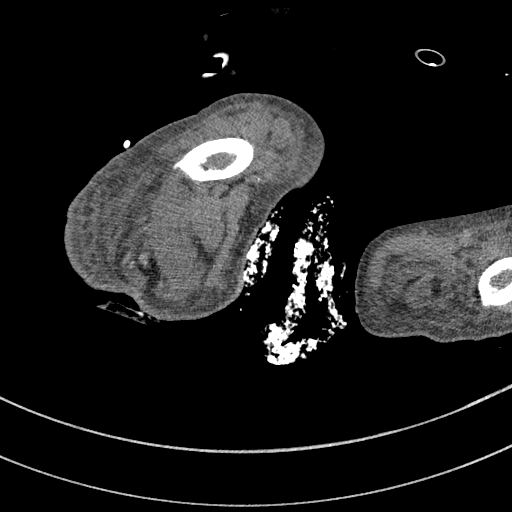
[im 20/371  bone]
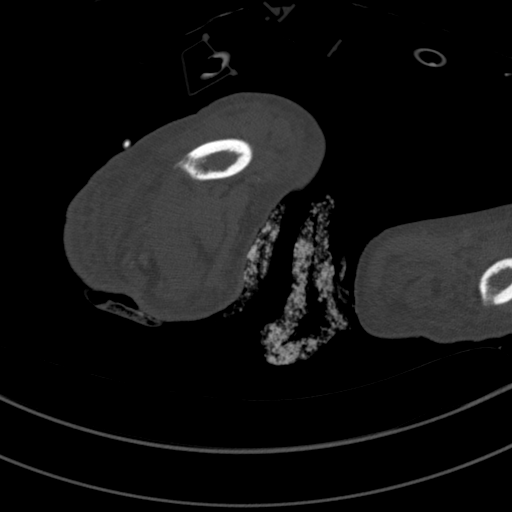
[im 59/371  soft-tissue]
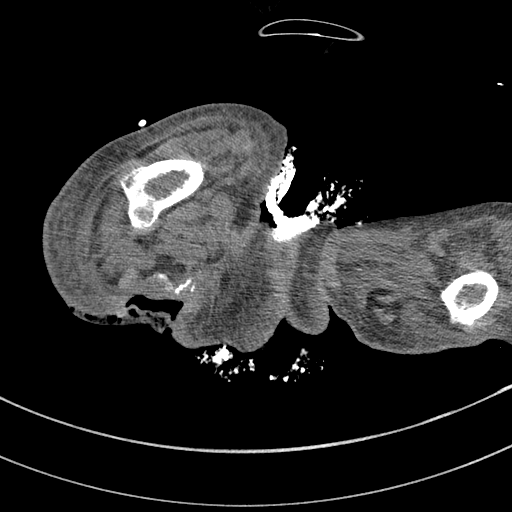
[im 98/371  soft-tissue]
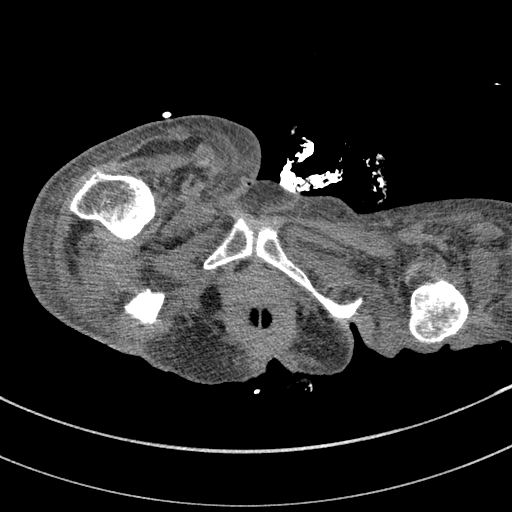
[im 117/371  soft-tissue]
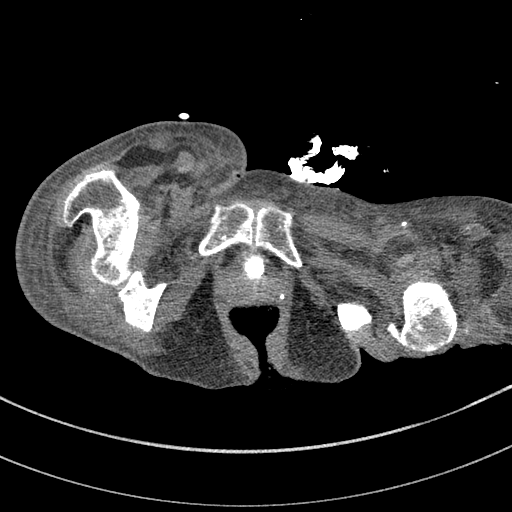
[im 156/371  soft-tissue]
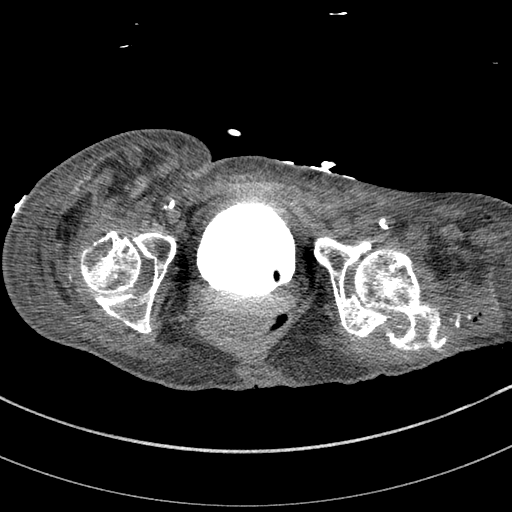
[im 195/371  soft-tissue]
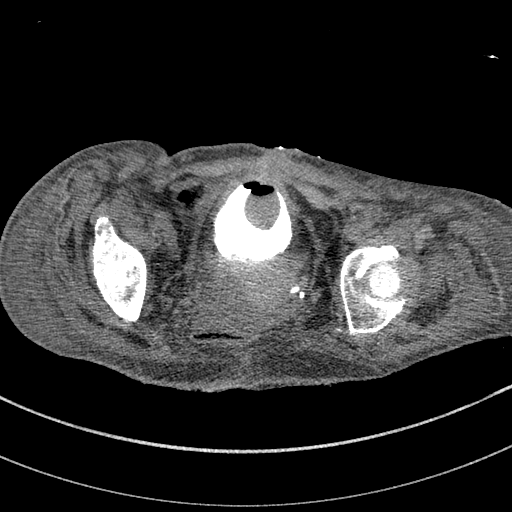
[im 215/371  soft-tissue]
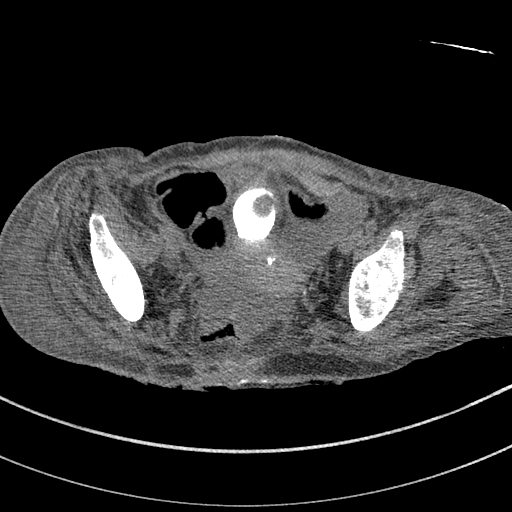
[im 254/371  soft-tissue]
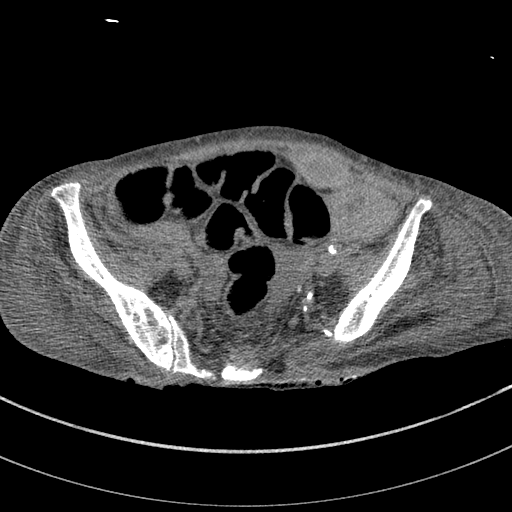
[im 273/371  soft-tissue]
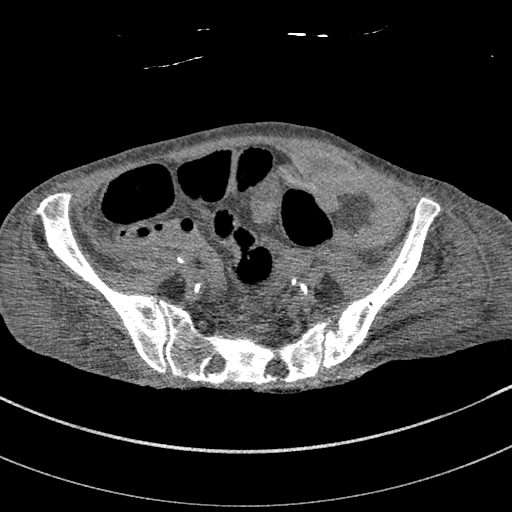
[im 273/371  bone]
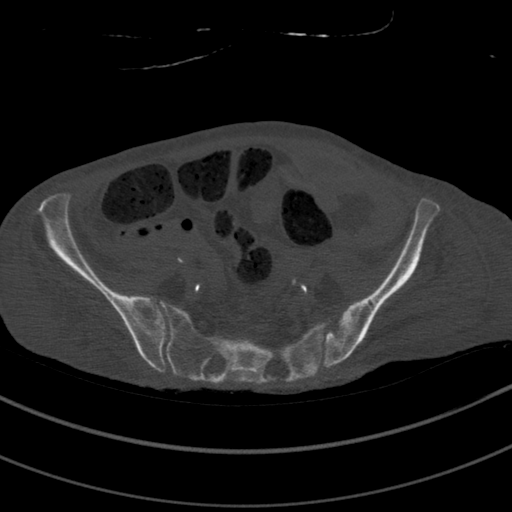
[im 312/371  soft-tissue]
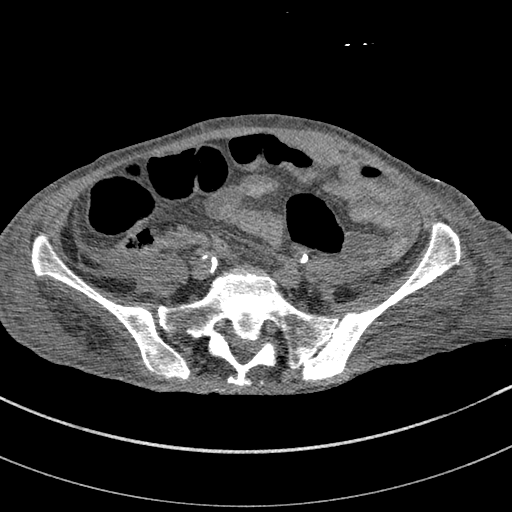
[im 351/371  soft-tissue]
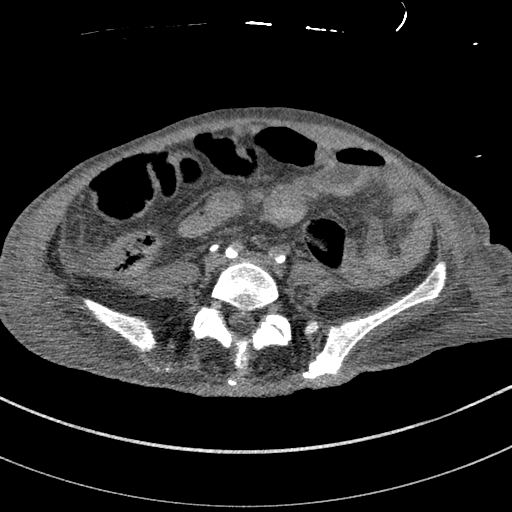

[Series 9: coronal st · coronal · 0.44mm/px · 3 of 98 slices shown]
[im 33/98  soft-tissue]
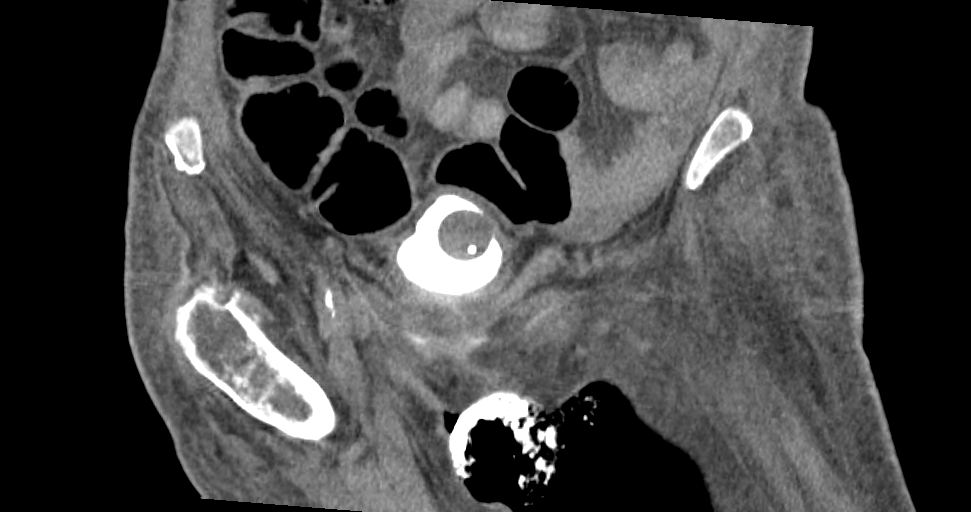
[im 44/98  soft-tissue]
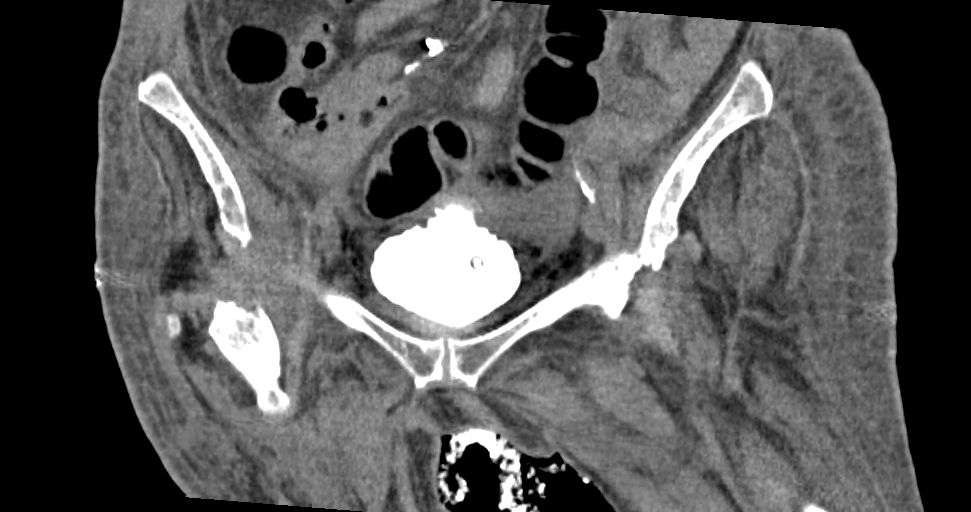
[im 54/98  soft-tissue]
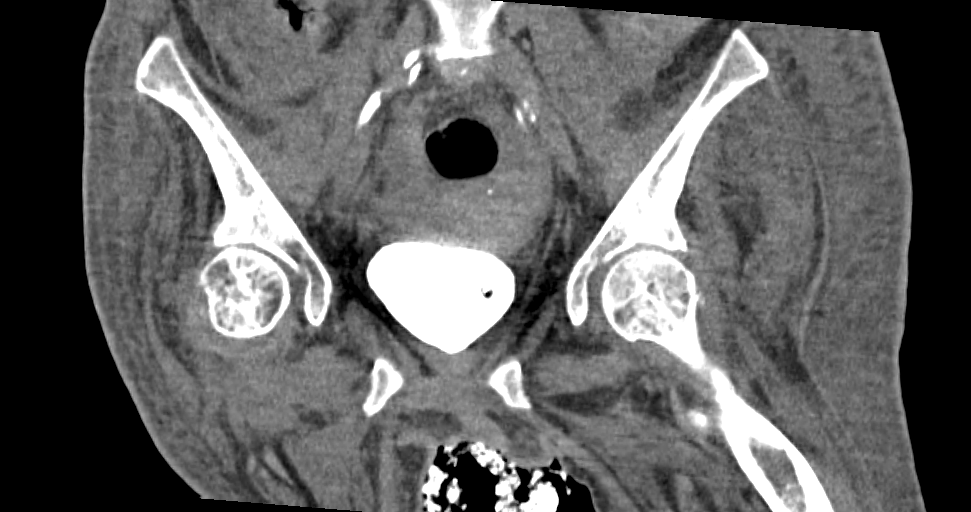

[14 of 46 positions shown; findings below may reference images not displayed]

FINDINGS: Urinary Tract: Instilled contrast material is identified within the
bladder lumen. Reflux of contrast into the distal ureters
bilaterally. Mild bladder wall thickening. Mildly irregular
posterior wall the bladder which may be related to debris, clot, or
wall irregularity. No extravasation of contrast into the
extraperitoneal spaces surrounding the bladder nor into the
peritoneal cavity.

Bowel: Gas, fluid, and stool throughout colon. Bowel loops grossly
unremarkable.

Vascular/Lymphatic: Atherosclerotic calcifications. No adenopathy.
11 mm LEFT inguinal node.

Reproductive:  Atrophic uterus.

Other: Small LEFT rectus sheath collection question hematoma
unchanged, 3.8 x 1.7 cm. Free fluid in pelvis. No free air.

Musculoskeletal: Chronic osseous demineralization. Bone destruction
at the sacrum, BILATERAL ischia, and proximal RIGHT femur from
decubitus ulcers and suspected osteomyelitis.
IMPRESSION: Mild bladder wall thickening without evidence of bladder
perforation/contrast extravasation.

Vesicoureteral reflux into distal ureters bilaterally.

Slightly irregular posterior wall of bladder lumen, could be related
to bladder wall thickening or dependent debris.

Small amount of nonspecific free pelvic fluid.

Small LEFT rectus sheath hematoma unchanged.

Osseous destruction from decubitus ulcers and suspected
osteomyelitis at the sacrum, BILATERAL ischia, and proximal RIGHT
femur.

Aortic Atherosclerosis ([id number]-CA1.1).

## 2021-02-16 IMAGING — XA IR PICC >5YO
1 series · 3 of 3 positions shown · non-contrast
Comparison: none

CLINICAL DATA: Osteomyelitis, needs durable venous access

EXAM:
PICC PLACEMENT WITH ULTRASOUND AND FLUOROSCOPY
FLUOROSCOPY TIME:  0.6 minutes; 12 u3ym3 DAP
TECHNIQUE: After written informed consent was obtained, patient was placed in
the supine position on angiographic table. Patency of the right
brachial vein was confirmed with ultrasound with image
documentation. An appropriate skin site was determined. Skin site
was marked. Region was prepped using maximum barrier technique
including cap and mask, sterile gown, sterile gloves, large sterile
sheet, and Chlorhexidine as cutaneous antisepsis. The region was
infiltrated locally with 1% lidocaine. Under real-time ultrasound
guidance, the right brachial vein was accessed with a 21 gauge
micropuncture needle; the needle tip within the vein was confirmed
with ultrasound image documentation. Needle exchanged over a 018
guidewire for a peel-away sheath, through which a 5-French
double-lumen power injectable PICC trimmed to 33cm was advanced,
positioned with its tip near the cavoatrial junction. Spot chest
radiograph confirms appropriate catheter position. Catheter was
flushed per protocol and secured externally. The patient tolerated
procedure well.
COMPLICATIONS:
COMPLICATIONS
none

[Series 1: fl(-)  angio sharp · 3 of 3 slices shown]
[im 1/3]
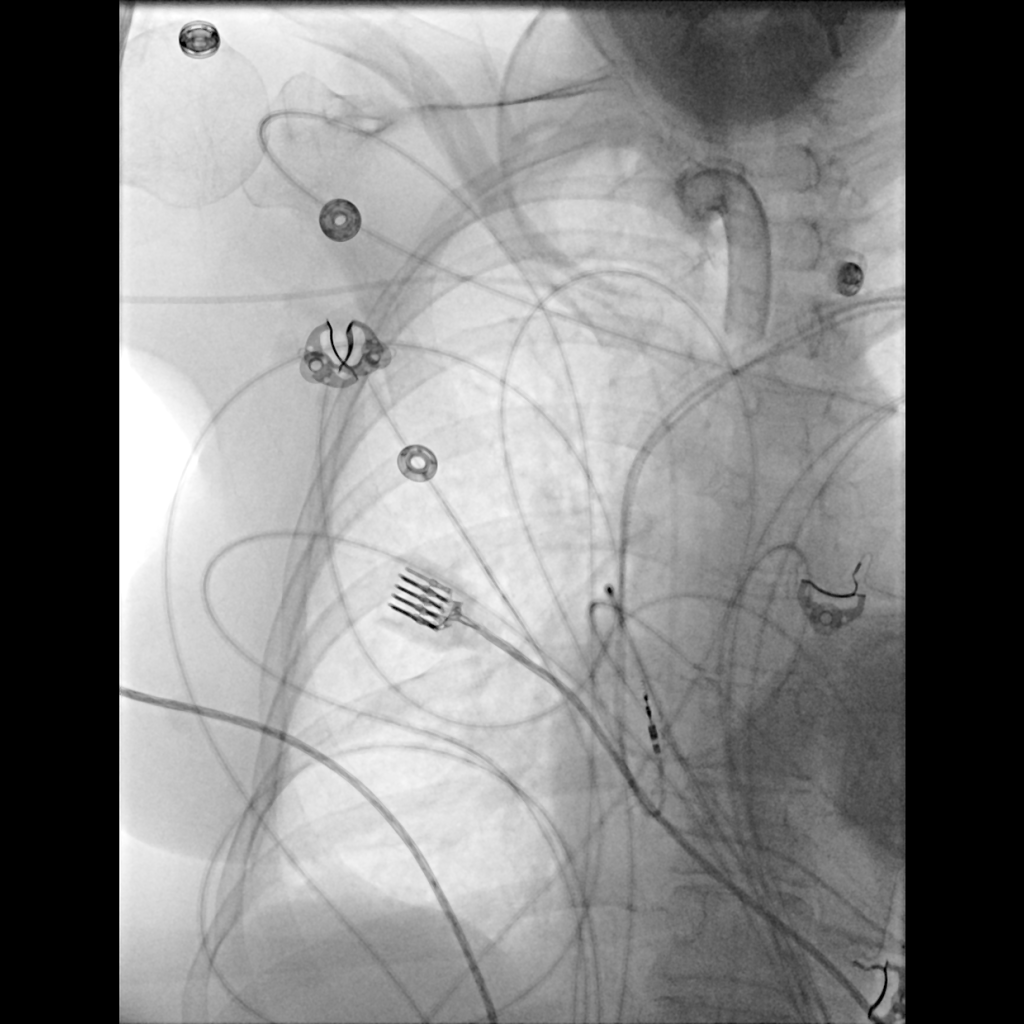
[im 2/3]
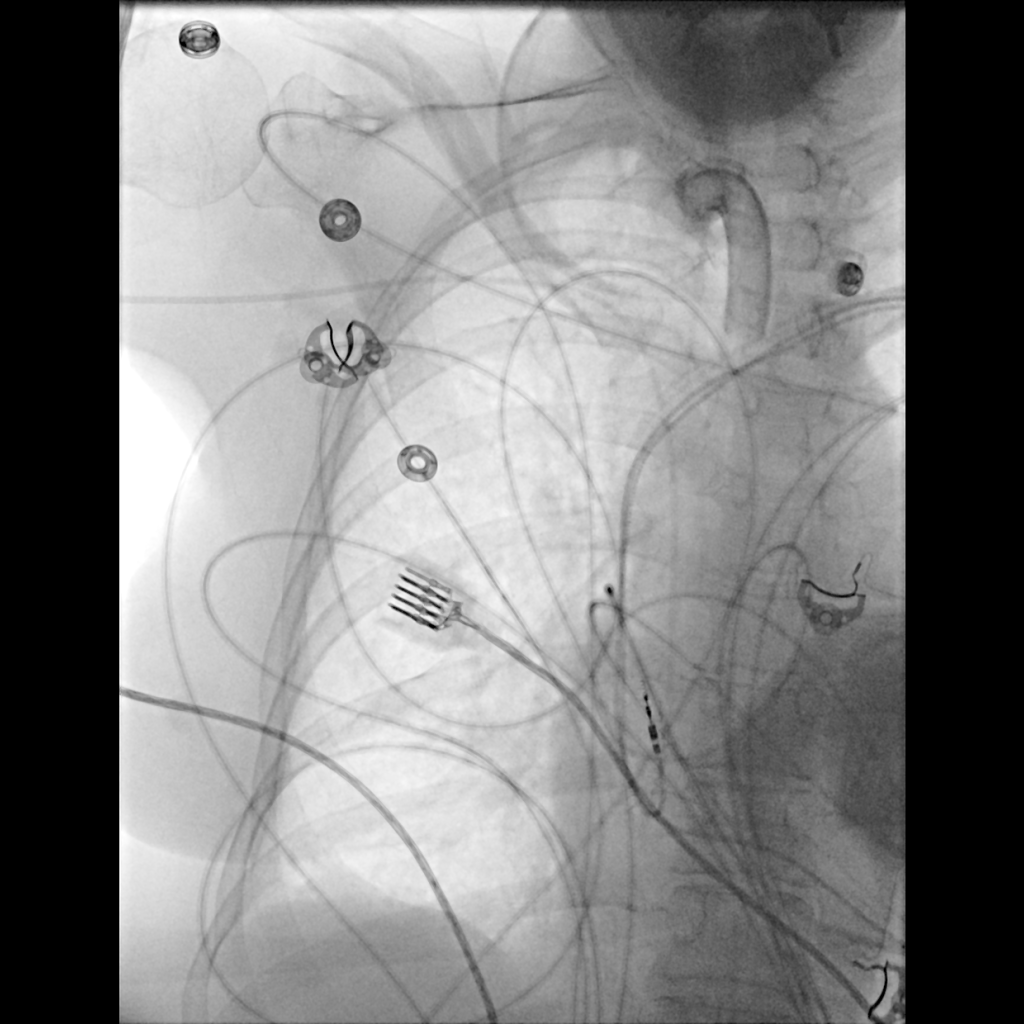
[im 3/3]
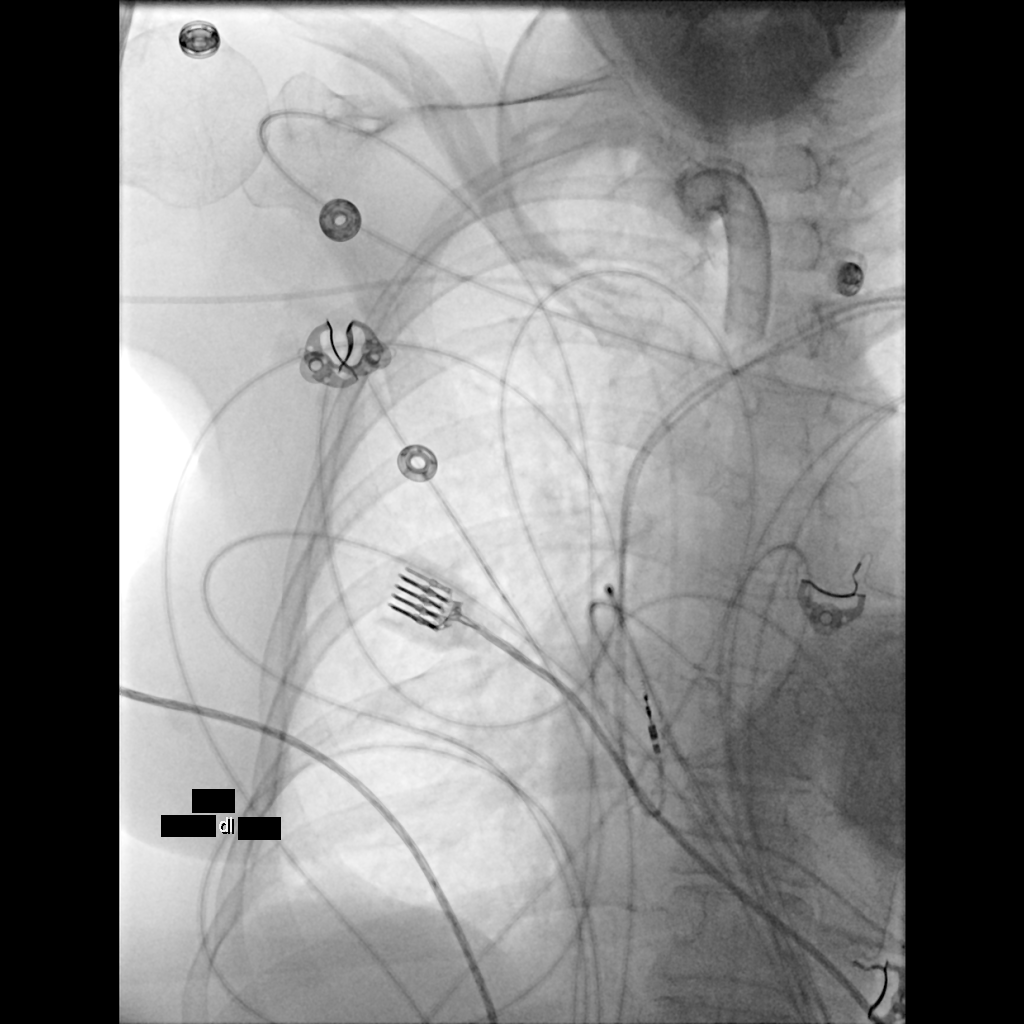

[3 of 3 positions shown; findings below may reference images not displayed]

IMPRESSION: 1. Technically successful five French double lumen power injectable
PICC placement

## 2021-02-18 IMAGING — US US ABDOMEN LIMITED
1 series · 14 of 25 positions shown · non-contrast
Comparison: CT abdomen and pelvis 01/12/2019

CLINICAL DATA: Elevated alkaline phosphatase, history quadriplegia,
substance abuse, CHF, COPD

EXAM:
ULTRASOUND ABDOMEN LIMITED RIGHT UPPER QUADRANT

[Series 1: us abdomen limited · 14 of 46 slices shown]
[im 1/46]
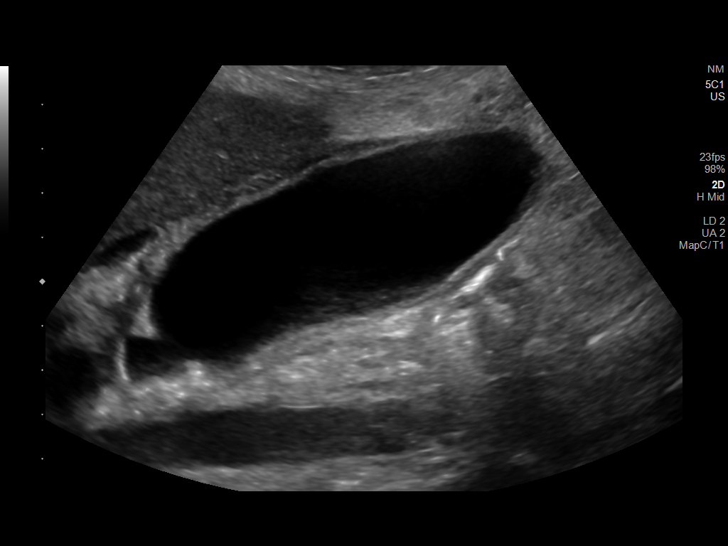
[im 4/46]
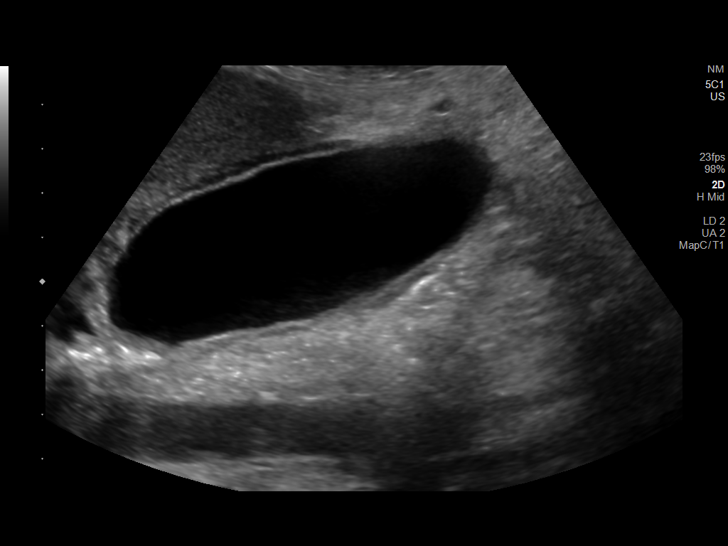
[im 8/46]
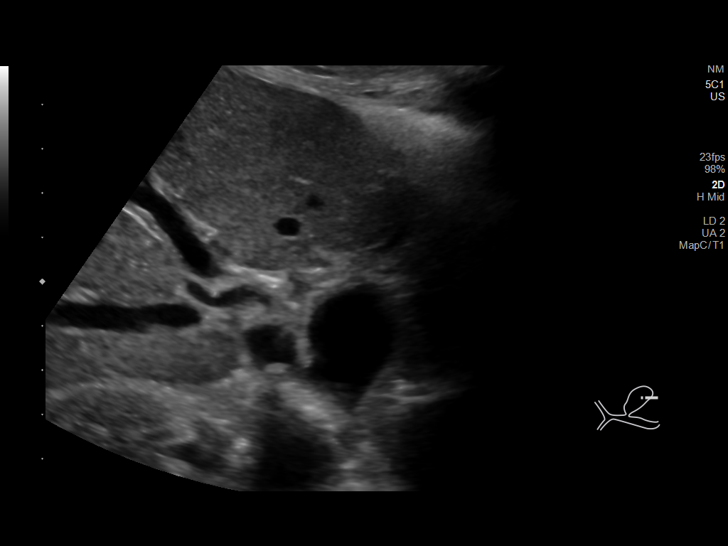
[im 12/46]
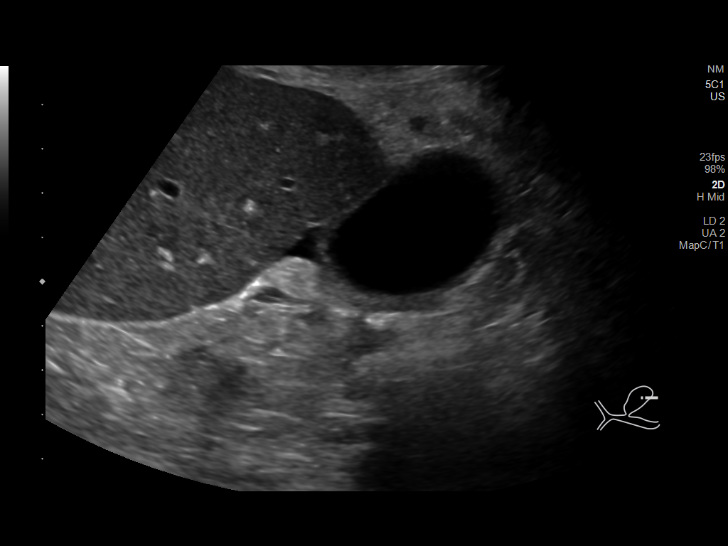
[im 16/46]
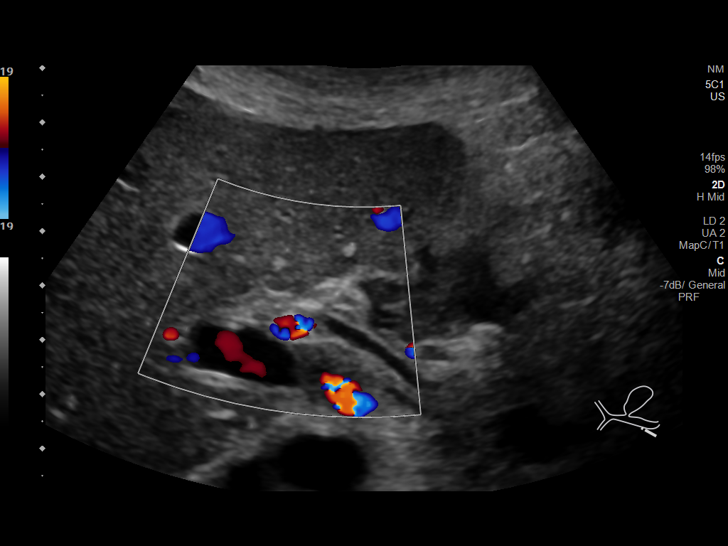
[im 17/46]
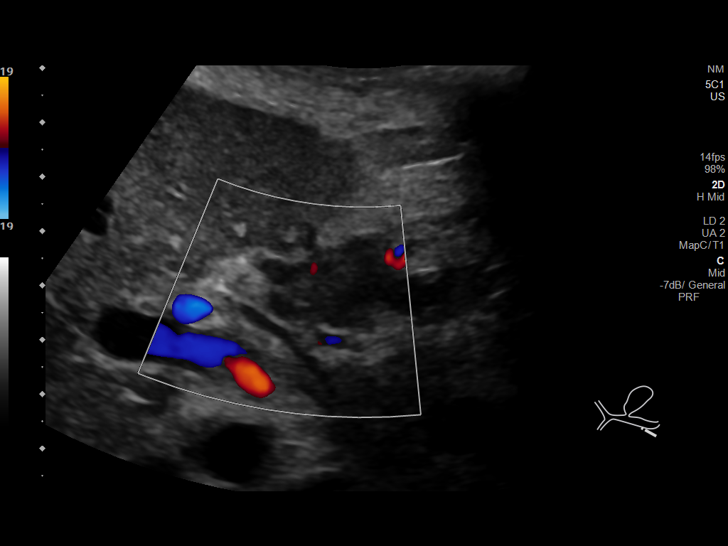
[im 21/46]
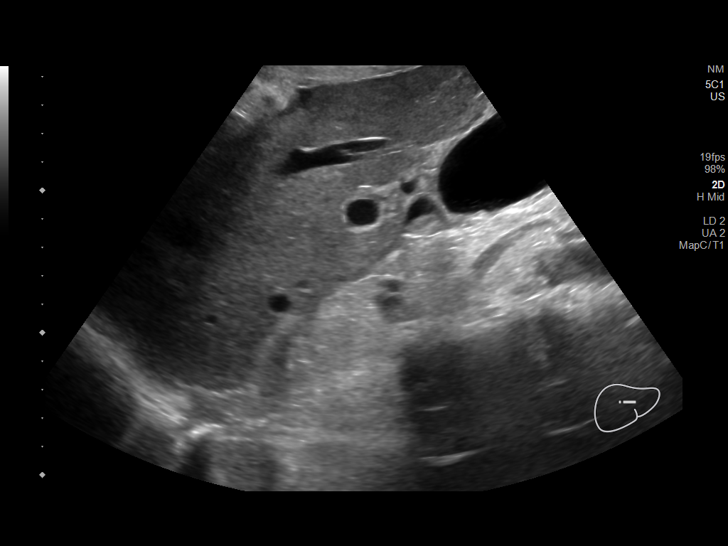
[im 25/46]
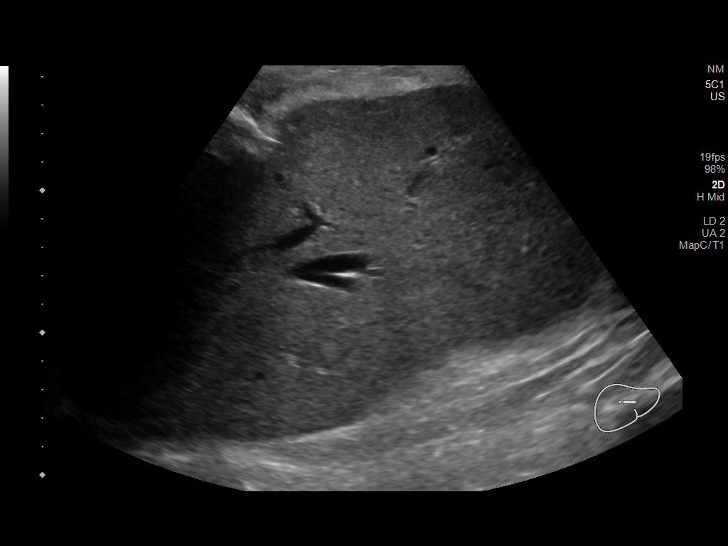
[im 29/46]
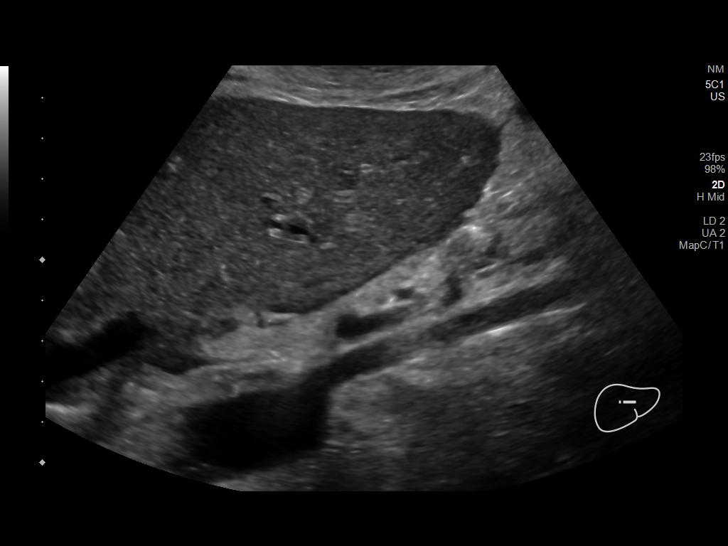
[im 31/46]
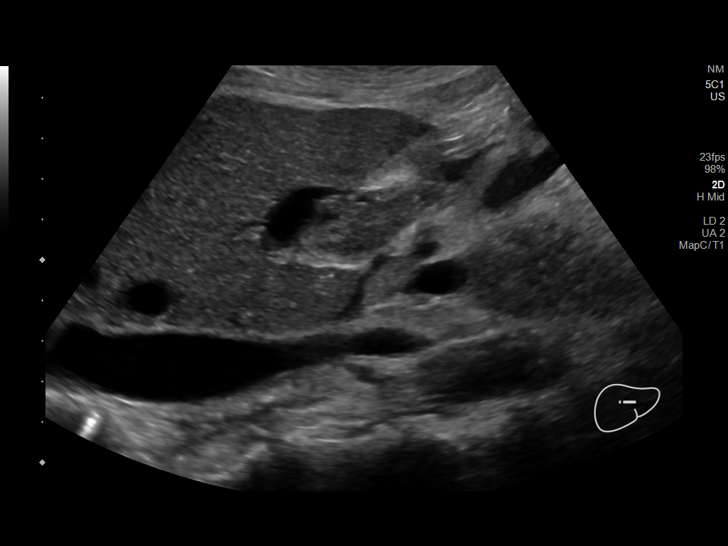
[im 34/46]
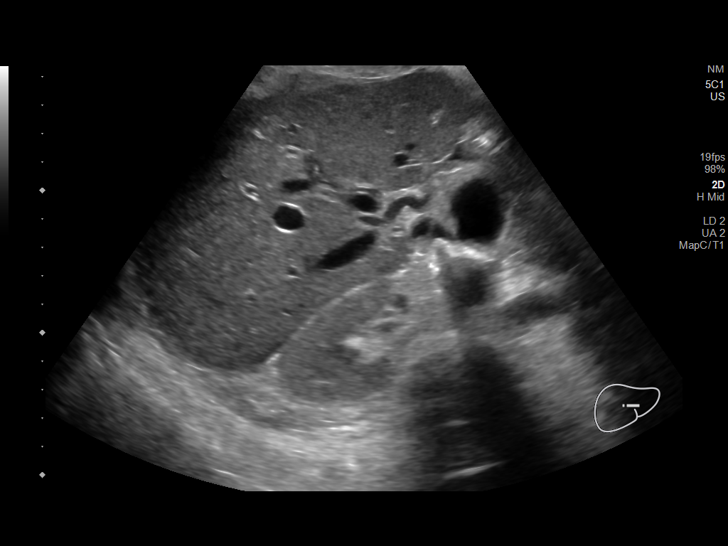
[im 38/46]
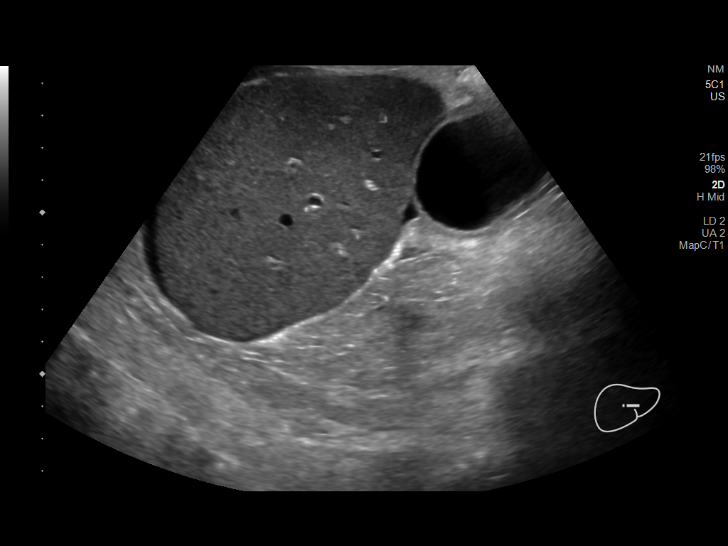
[im 42/46]
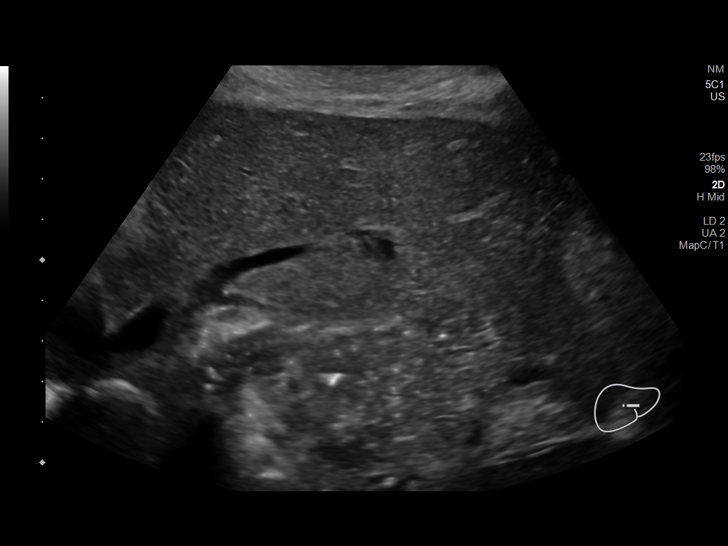
[im 46/46]
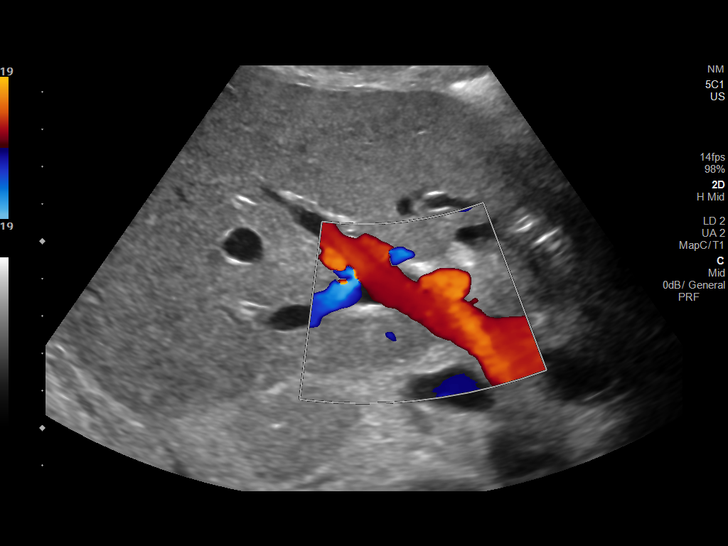

[14 of 25 positions shown; findings below may reference images not displayed]

FINDINGS: Gallbladder:

Well distended with mildly thickened gallbladder wall. Small amount
of dependent sludge. No shadowing calculi or sonographic Murphy
sign.

Common bile duct:

Diameter: 3 mm, normal

Liver:

Normal echogenicity without mass. Portal vein is patent on color
Doppler imaging with normal direction of blood flow towards the
liver.

Other: Small amount of perihepatic free fluid.
IMPRESSION: Small amount of perihepatic free fluid.

Mild gallbladder wall thickening, nonspecific in the setting of
ascites.

No others RIGHT upper quadrant sonographic abnormalities identified.

## 2021-02-19 IMAGING — CR DG CHEST 1V PORT
1 series · 1 of 1 positions shown · non-contrast
Comparison: Prior chest x-ray 01/12/2019

CLINICAL DATA: 48-year-old female with dyspnea, [id number] and sepsis

EXAM:
PORTABLE CHEST 1 VIEW

[AP]
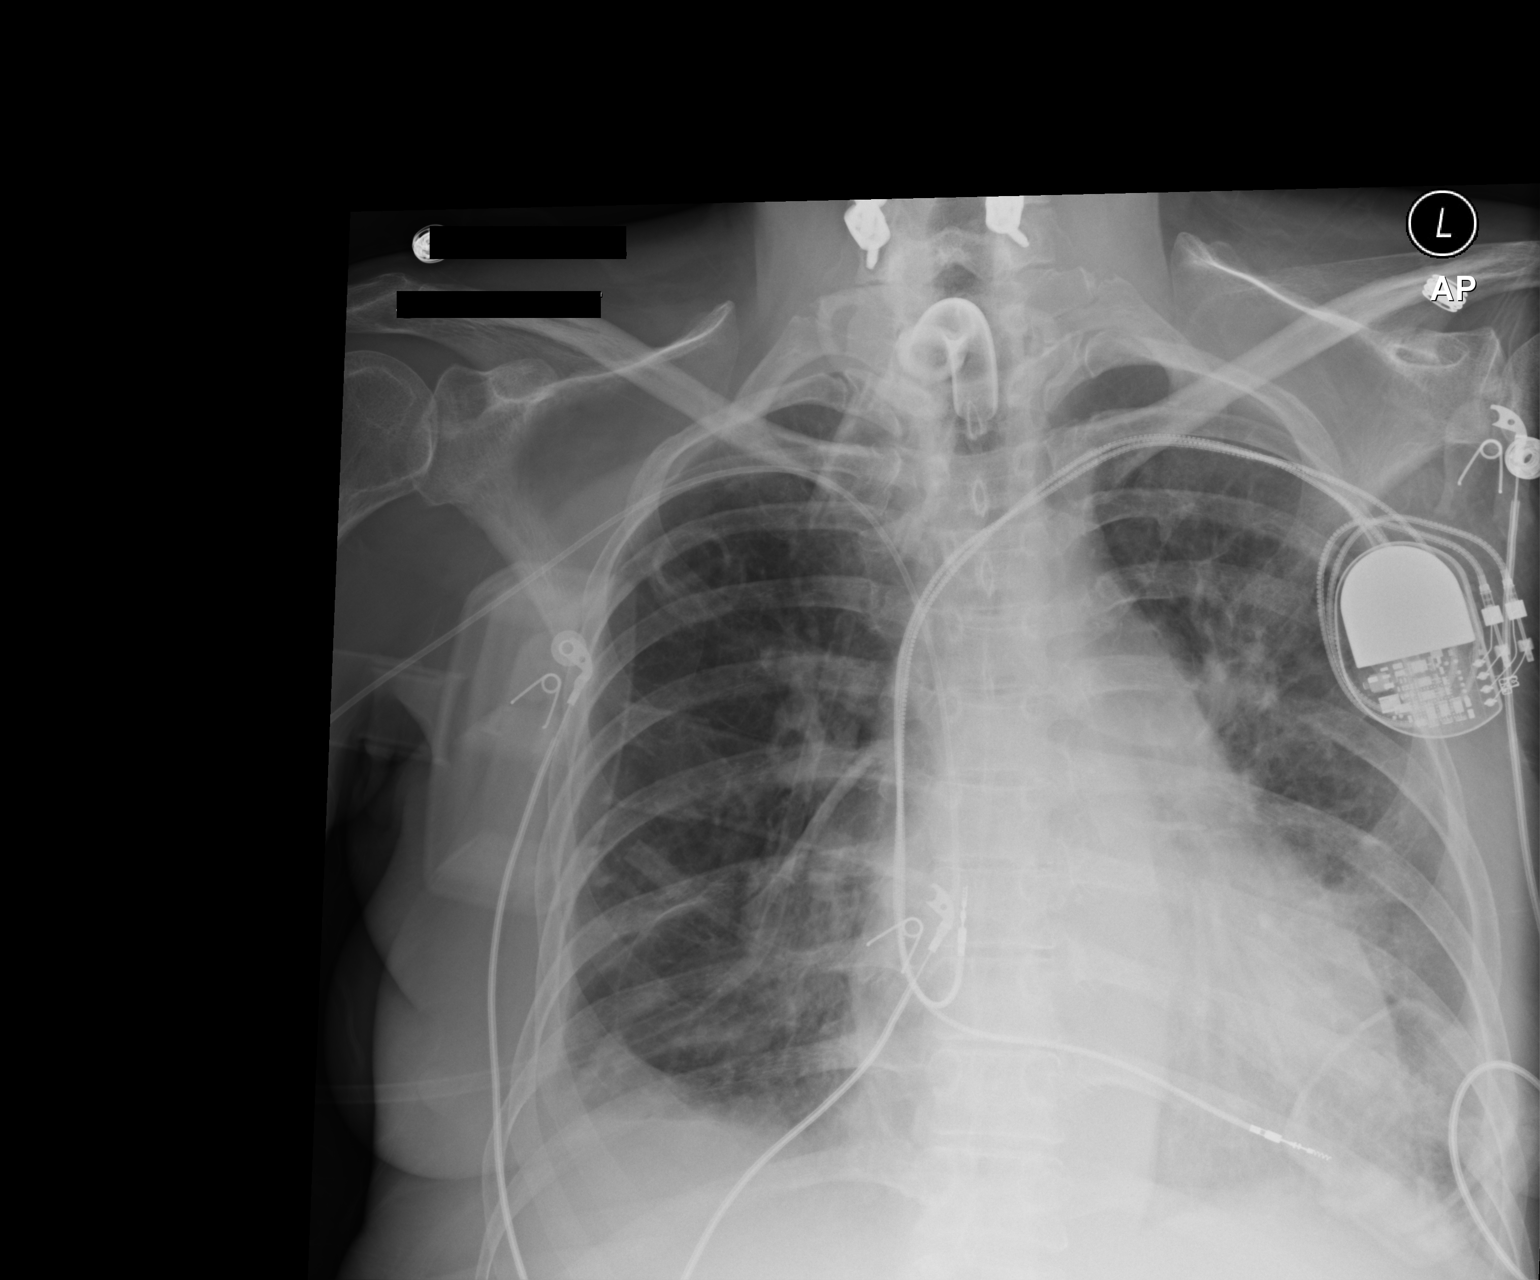

[1 of 1 positions shown; findings below may reference images not displayed]

FINDINGS: Tracheostomy tube remains in good position. The tip of the tube is
midline and at the level of the clavicles. Left subclavian approach
cardiac rhythm maintenance device remains in unchanged position.
Right upper extremity PICC has been placed. The tip overlies the
cavoatrial junction. Cardiac and mediastinal contours are within
normal limits. Inspiratory volumes are low. Small right pleural
effusion. Patchy interstitial and airspace opacities present in both
lower lungs and in the left mid lung.
IMPRESSION: 1. Lower inspiratory volumes.
2. Developing patchy airspace opacities in the left mid lung and
bilateral bases likely representing multifocal viral pneumonia.
3. Small right pleural effusion.
4. New right upper extremity PICC. Catheter tip overlies the
superior cavoatrial junction.
5. Other support apparatus in stable and satisfactory position.

## 2021-02-20 IMAGING — CT CT ANGIO CHEST
2 of 6 series · 18 of 46 positions shown · IV contrast (omnipaque)
Comparison: Chest radiograph, 01/16/2019.  CTA chest, 06/09/2016.

CLINICAL DATA: Shortness of breath. Altered mental status.
Difficulty with following breathing instructions, which somewhat
limits this study.

EXAM:
CT ANGIOGRAPHY CHEST WITH CONTRAST
TECHNIQUE: Multidetector CT imaging of the chest was performed using the
standard protocol during bolus administration of intravenous
contrast. Multiplanar CT image reconstructions and MIPs were
obtained to evaluate the vascular anatomy.
CONTRAST:  70mL OMNIPAQUE IOHEXOL 350 MG/ML SOLN

[Series 6: thins (person_name) · axial · 0.61mm/px · z∈[+8,+271]mm · 15 of 289 slices shown]
[im 13/289  lung]
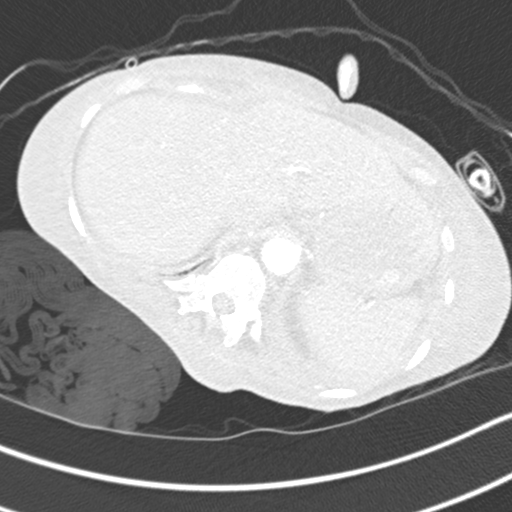
[im 38/289  soft-tissue]
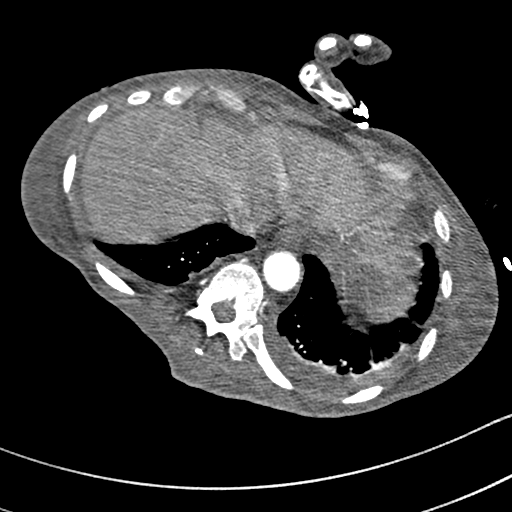
[im 51/289  lung]
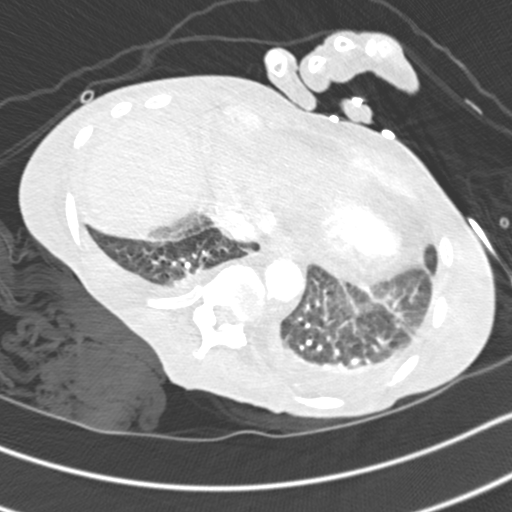
[im 76/289  soft-tissue]
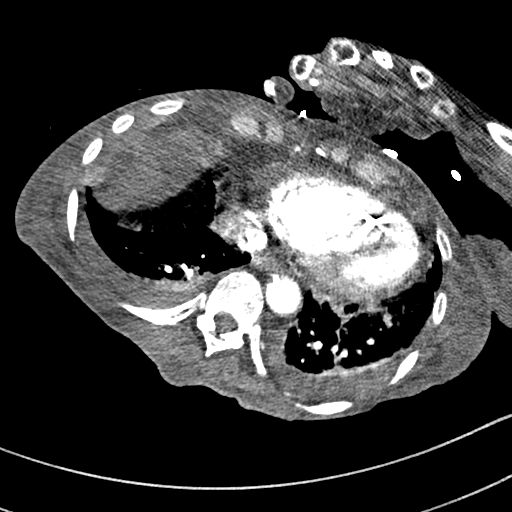
[im 88/289  lung]
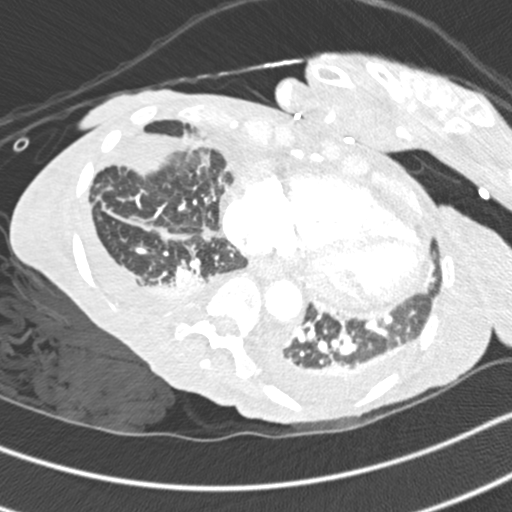
[im 113/289  soft-tissue]
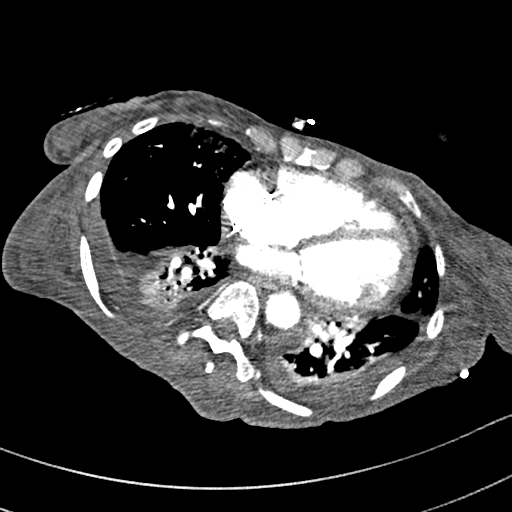
[im 126/289  lung]
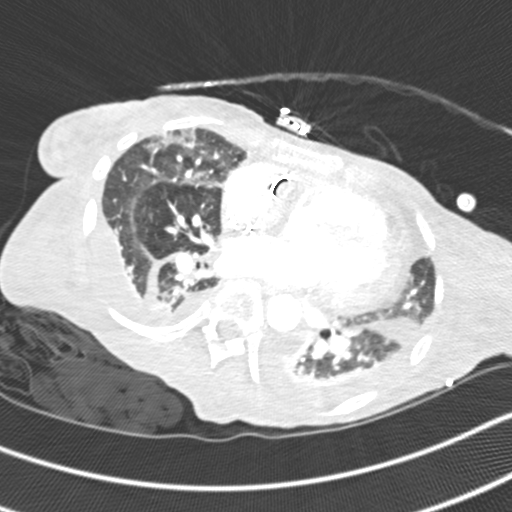
[im 151/289  soft-tissue]
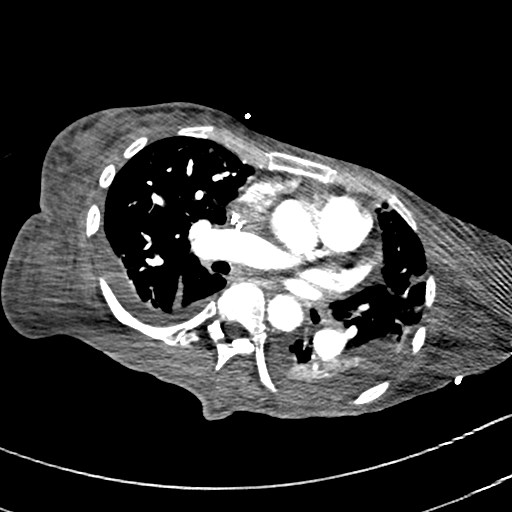
[im 163/289  lung]
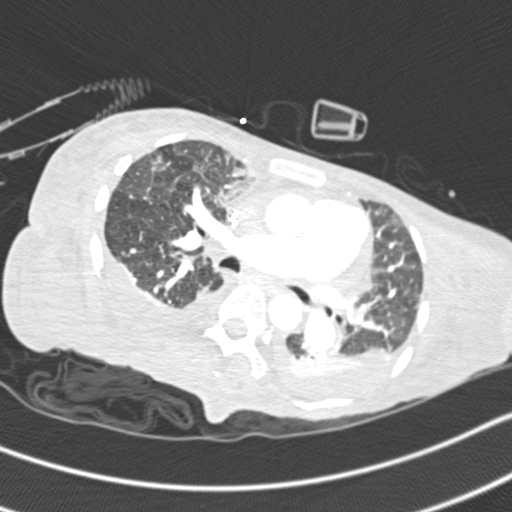
[im 176/289  soft-tissue]
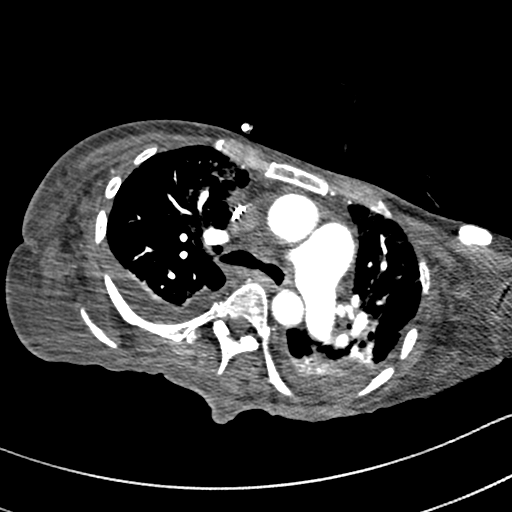
[im 201/289  lung]
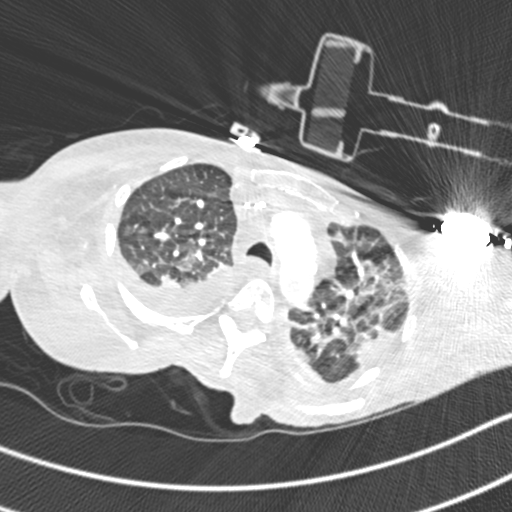
[im 213/289  soft-tissue]
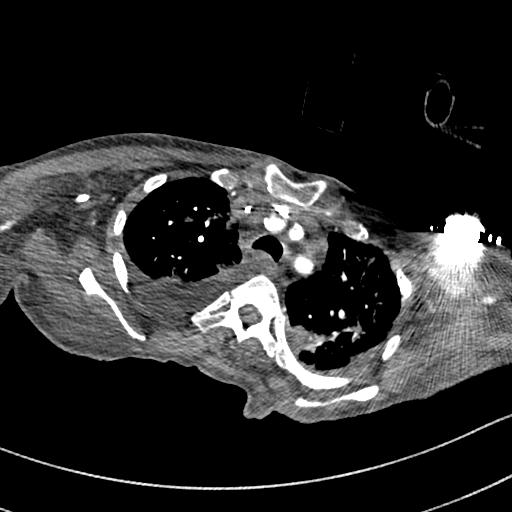
[im 238/289  lung]
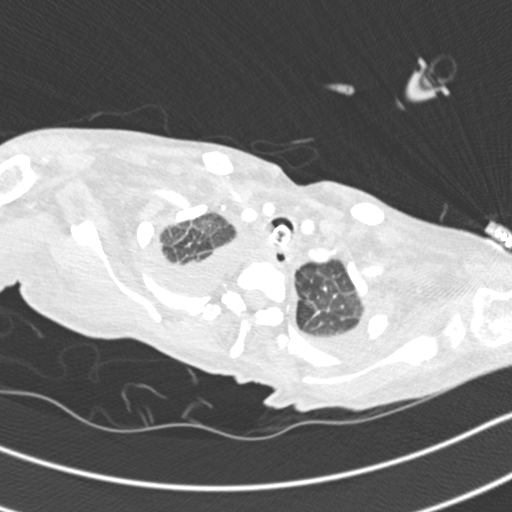
[im 251/289  soft-tissue]
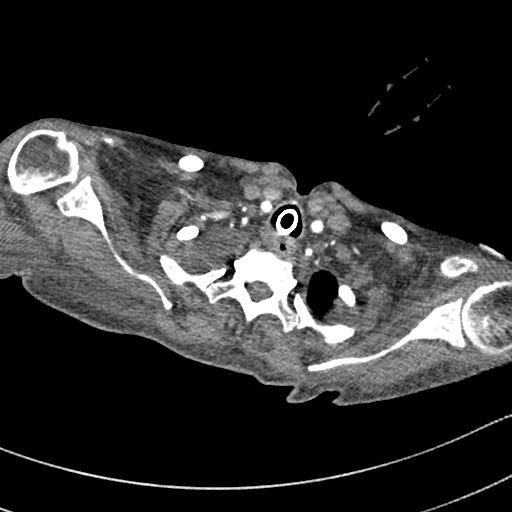
[im 276/289  lung]
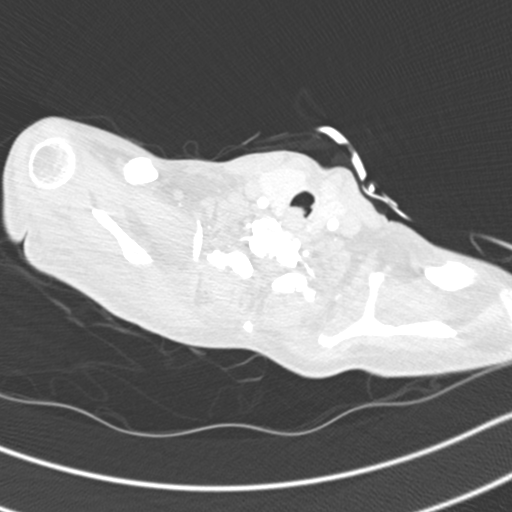

[Series 8: coronal mpr · coronal · 0.59mm/px · 3 of 107 slices shown]
[im 27/107  soft-tissue]
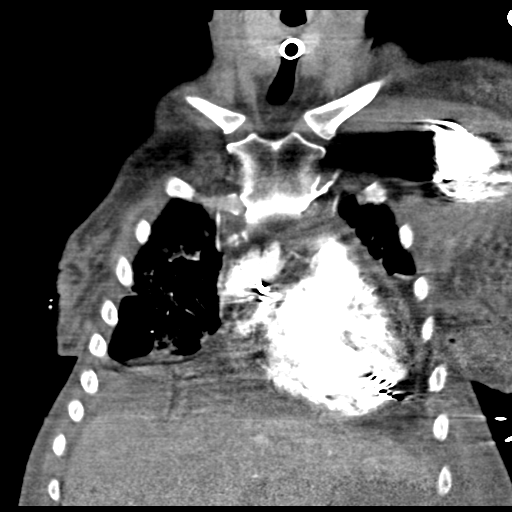
[im 54/107  soft-tissue]
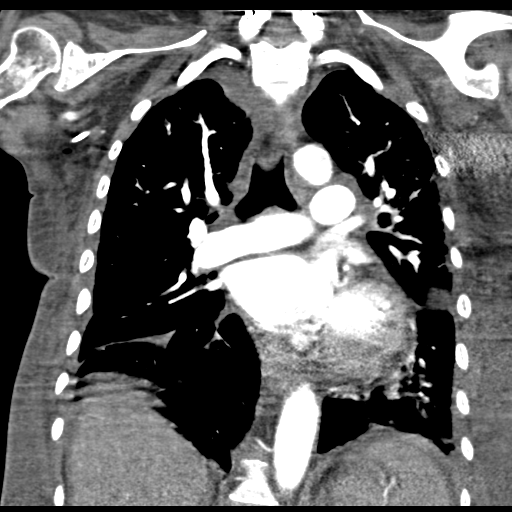
[im 80/107  soft-tissue]
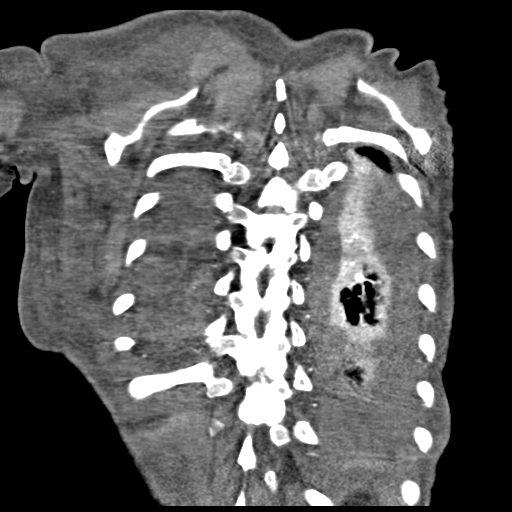

[18 of 46 positions shown; findings below may reference images not displayed]

FINDINGS: Cardiovascular: There is satisfactory opacification of the pulmonary
arteries to the segmental level. There is no evidence of a pulmonary
embolism.

Heart is top-normal in size. No pericardial effusion. Two vessel
coronary artery calcifications. Aorta is normal in caliber. No
dissection. Minor aortic atherosclerosis.

Mediastinum/Nodes: Well-positioned tracheostomy tube. No neck base
or axillary masses or enlarged lymph nodes. 1.2 cm short axis right
paratracheal lymph node, image 35, series 5. Poorly defined
additional prominent lymph nodes noted along the paratracheal region
and in the pre-vascular space. No mediastinal masses. No hilar
masses or enlarged lymph nodes. Trachea is patent. Esophagus is
unremarkable.

Right PICC tip in the right atrium.  AICD leads well positioned.

Lungs/Pleura: Small bilateral pleural effusions. Bilateral
interstitial thickening. Patchy areas of ground-glass opacity are
noted bilaterally. There is dependent opacity in the lower lobes
that is consistent with atelectasis.

No pneumothorax.

Upper Abdomen: No acute abnormality.

Musculoskeletal: Diffuse subcutaneous soft tissue edema.

No fracture or bone lesion.  No chest wall masses.

Review of the MIP images confirms the above findings.
IMPRESSION: 1. No evidence of a pulmonary embolism.
2. Suspect pulmonary edema, likely on the basis of congestive heart
failure, reflected by interstitial thickening, small bilateral
effusions and patchy areas of ground-glass opacity. Infection should
be considered if there are consistent clinical findings.
3. There is additional dependent opacity in the lower lobes
consistent with atelectasis.
4. Two vessel coronary artery calcifications. Minor aortic
atherosclerosis.

Aortic Atherosclerosis ([id number].J).

## 2021-02-22 IMAGING — US US ABDOMEN LIMITED
1 series · 14 of 25 positions shown · non-contrast
Comparison: Ultrasound 01/15/2019.  Chest CT 01/17/2019

CLINICAL DATA: Elevated alkaline phosphatase

EXAM:
ULTRASOUND ABDOMEN LIMITED RIGHT UPPER QUADRANT

[Series 1: us abdomen limited · 14 of 44 slices shown]
[im 1/44]
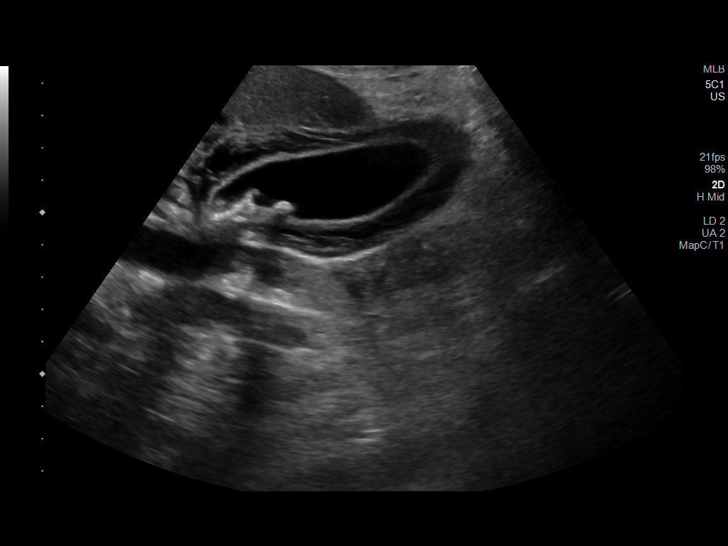
[im 4/44]
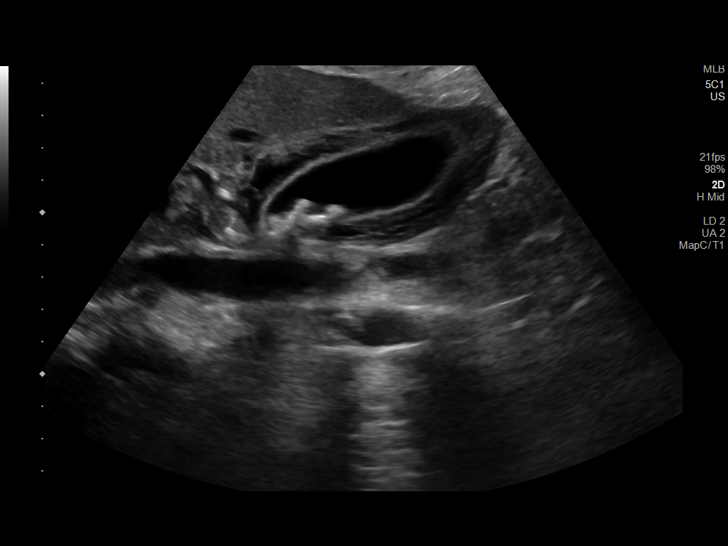
[im 8/44]
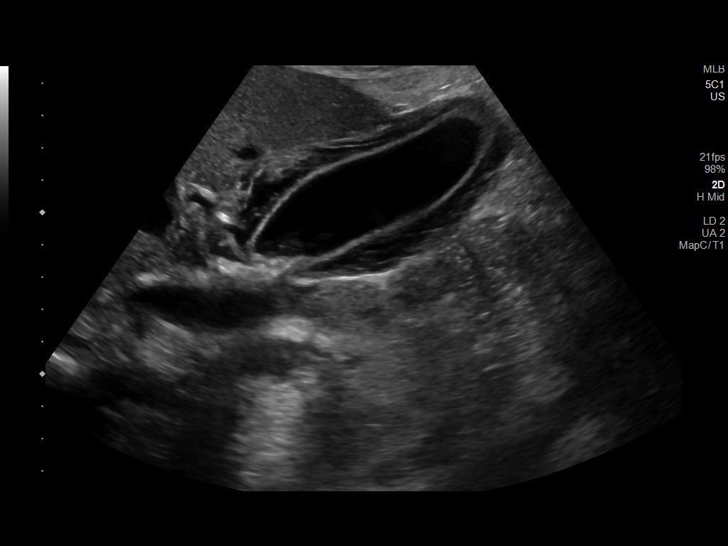
[im 11/44]
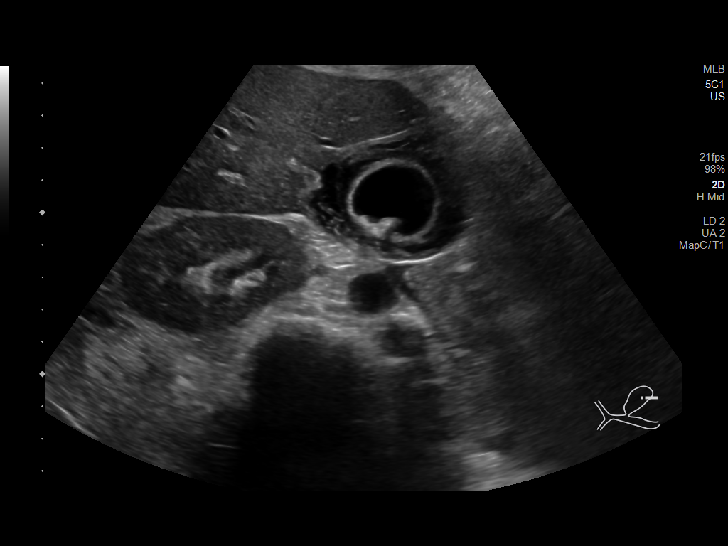
[im 15/44]
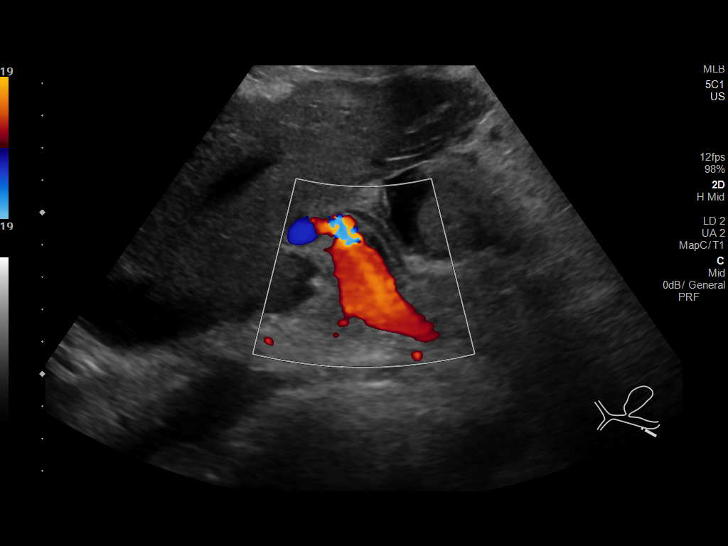
[im 17/44]
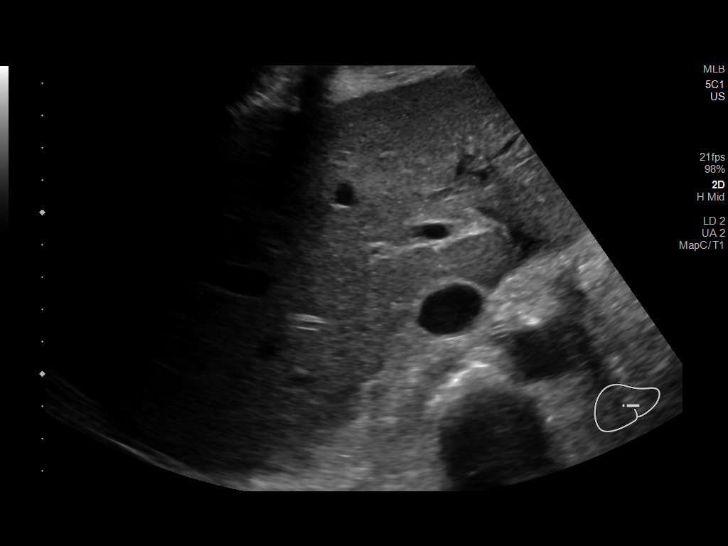
[im 20/44]
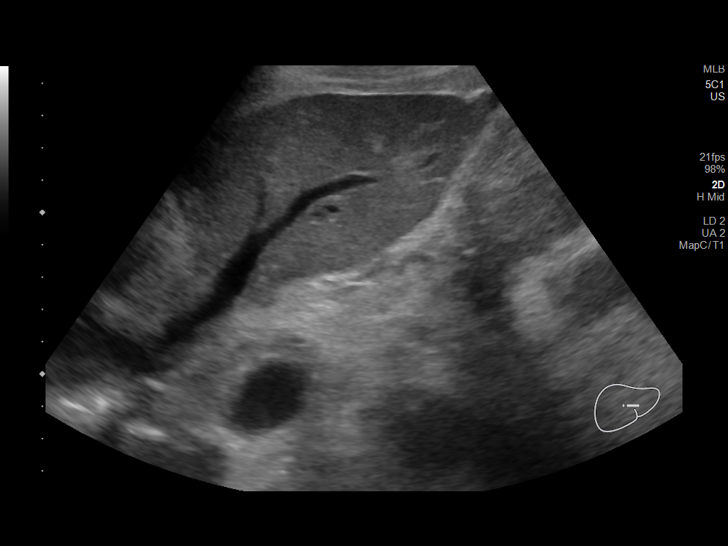
[im 24/44]
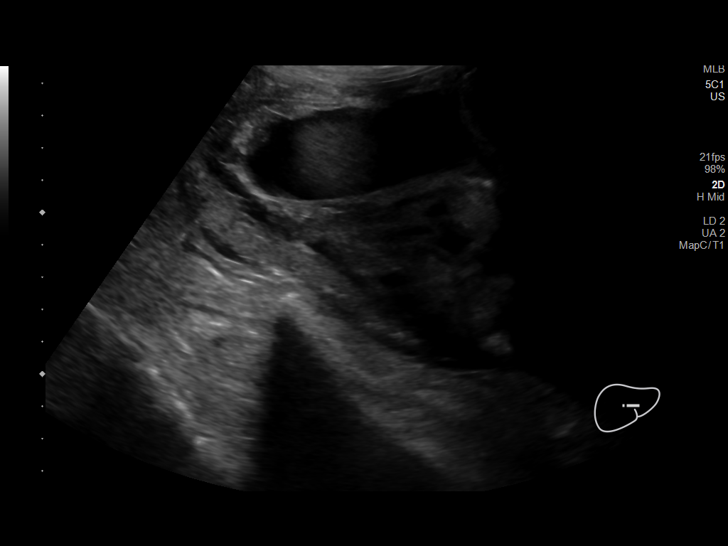
[im 27/44]
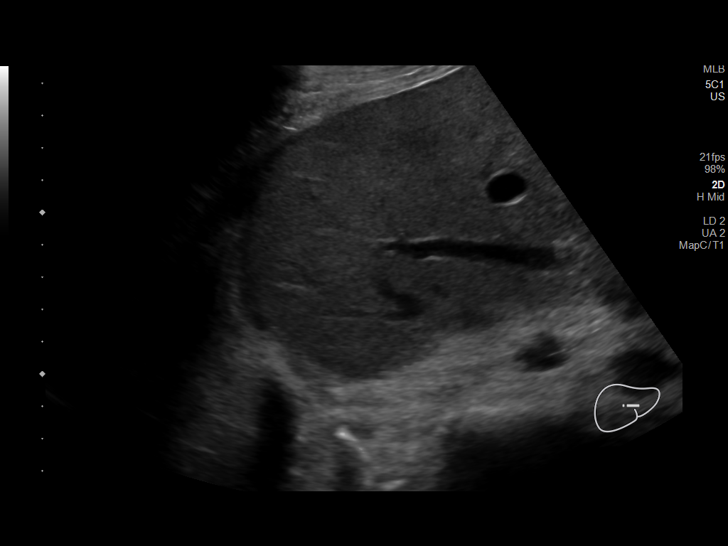
[im 29/44]
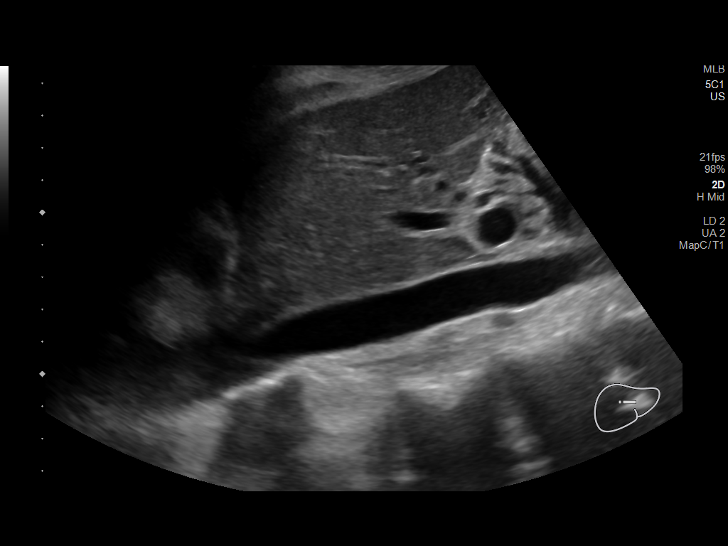
[im 33/44]
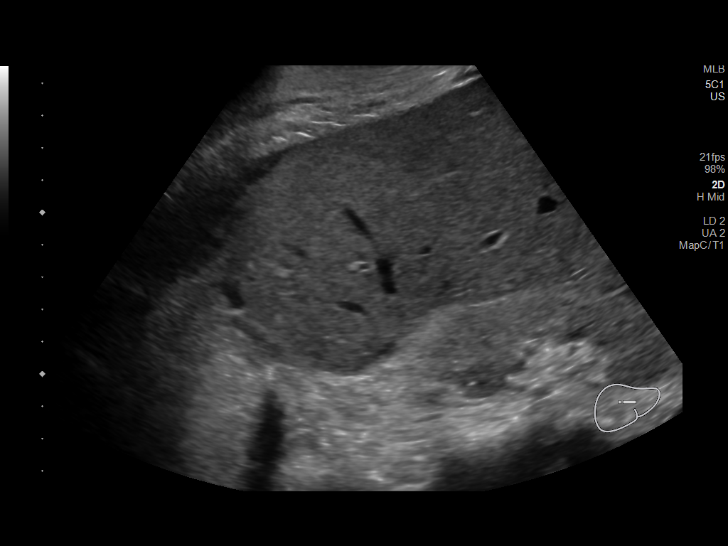
[im 36/44]
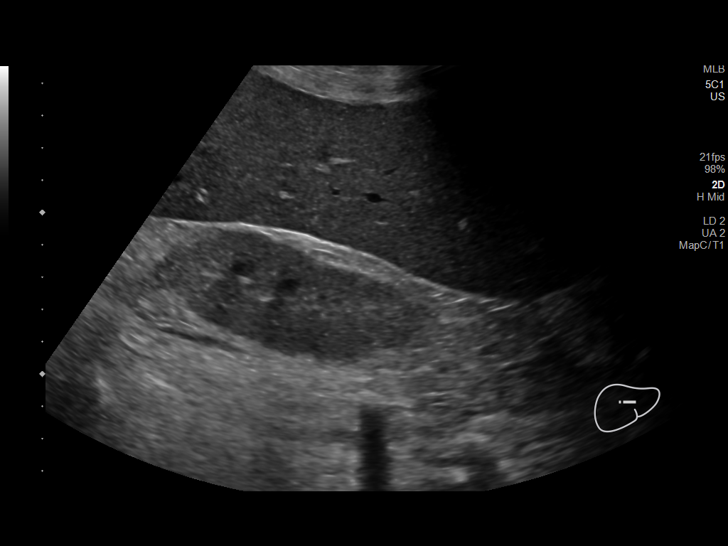
[im 40/44]
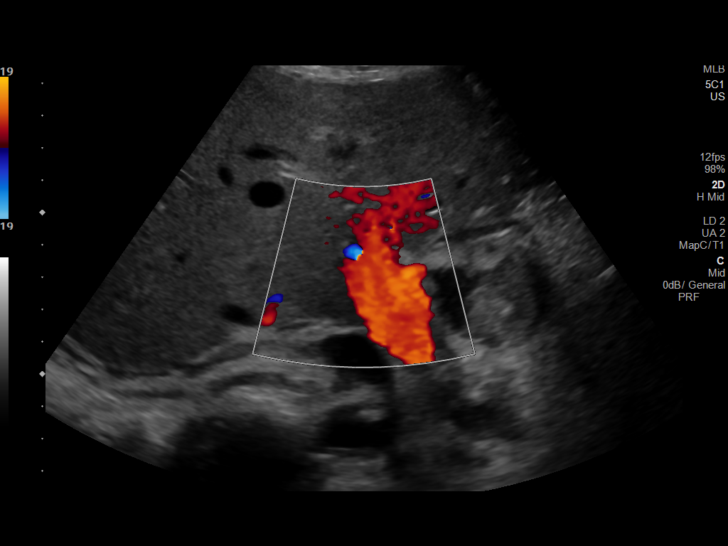
[im 44/44]
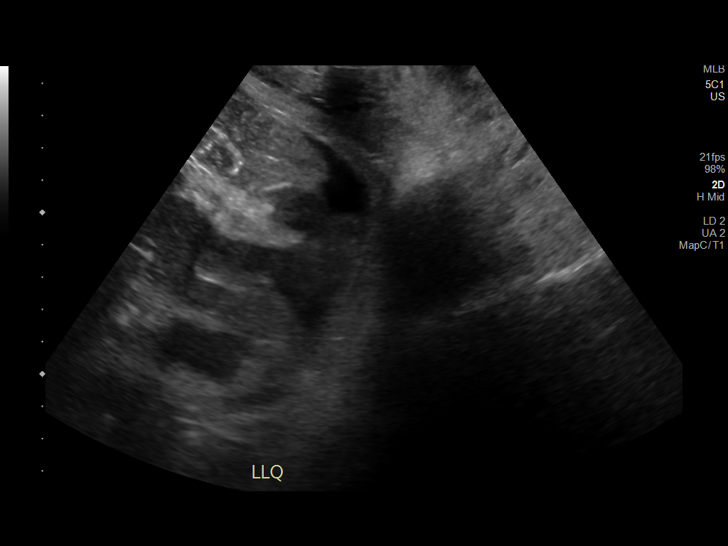

[14 of 25 positions shown; findings below may reference images not displayed]

FINDINGS: Gallbladder:

Marked gallbladder wall thickening measuring up to 8 mm, worsening
since prior study. Small mobile gallstones, the largest 7 mm.

Common bile duct:

Diameter: Normal caliber, 4 mm

Liver:

No focal lesion identified. Within normal limits in parenchymal
echogenicity. Portal vein is patent on color Doppler imaging with
normal direction of blood flow towards the liver.

Other: Small amount of perihepatic ascites.
IMPRESSION: Worsening gallbladder wall thickening, now measuring up to 8 mm in
thickness. There are small gallstones. Negative sonographic Murphy's
sign. If further evaluation is felt warranted, nuclear medicine
hepatobiliary scan may be helpful to assess for cystic duct patency.

Small amount of perihepatic ascites.

## 2021-02-22 IMAGING — DX DG ABDOMEN 1V
1 series · 1 of 1 positions shown · non-contrast
Comparison: CT the abdomen and pelvis 01/12/2019.

CLINICAL DATA: 48-year-old female with history of suprapubic
catheter.

EXAM:
ABDOMEN - 1 VIEW

[abdomen kub]
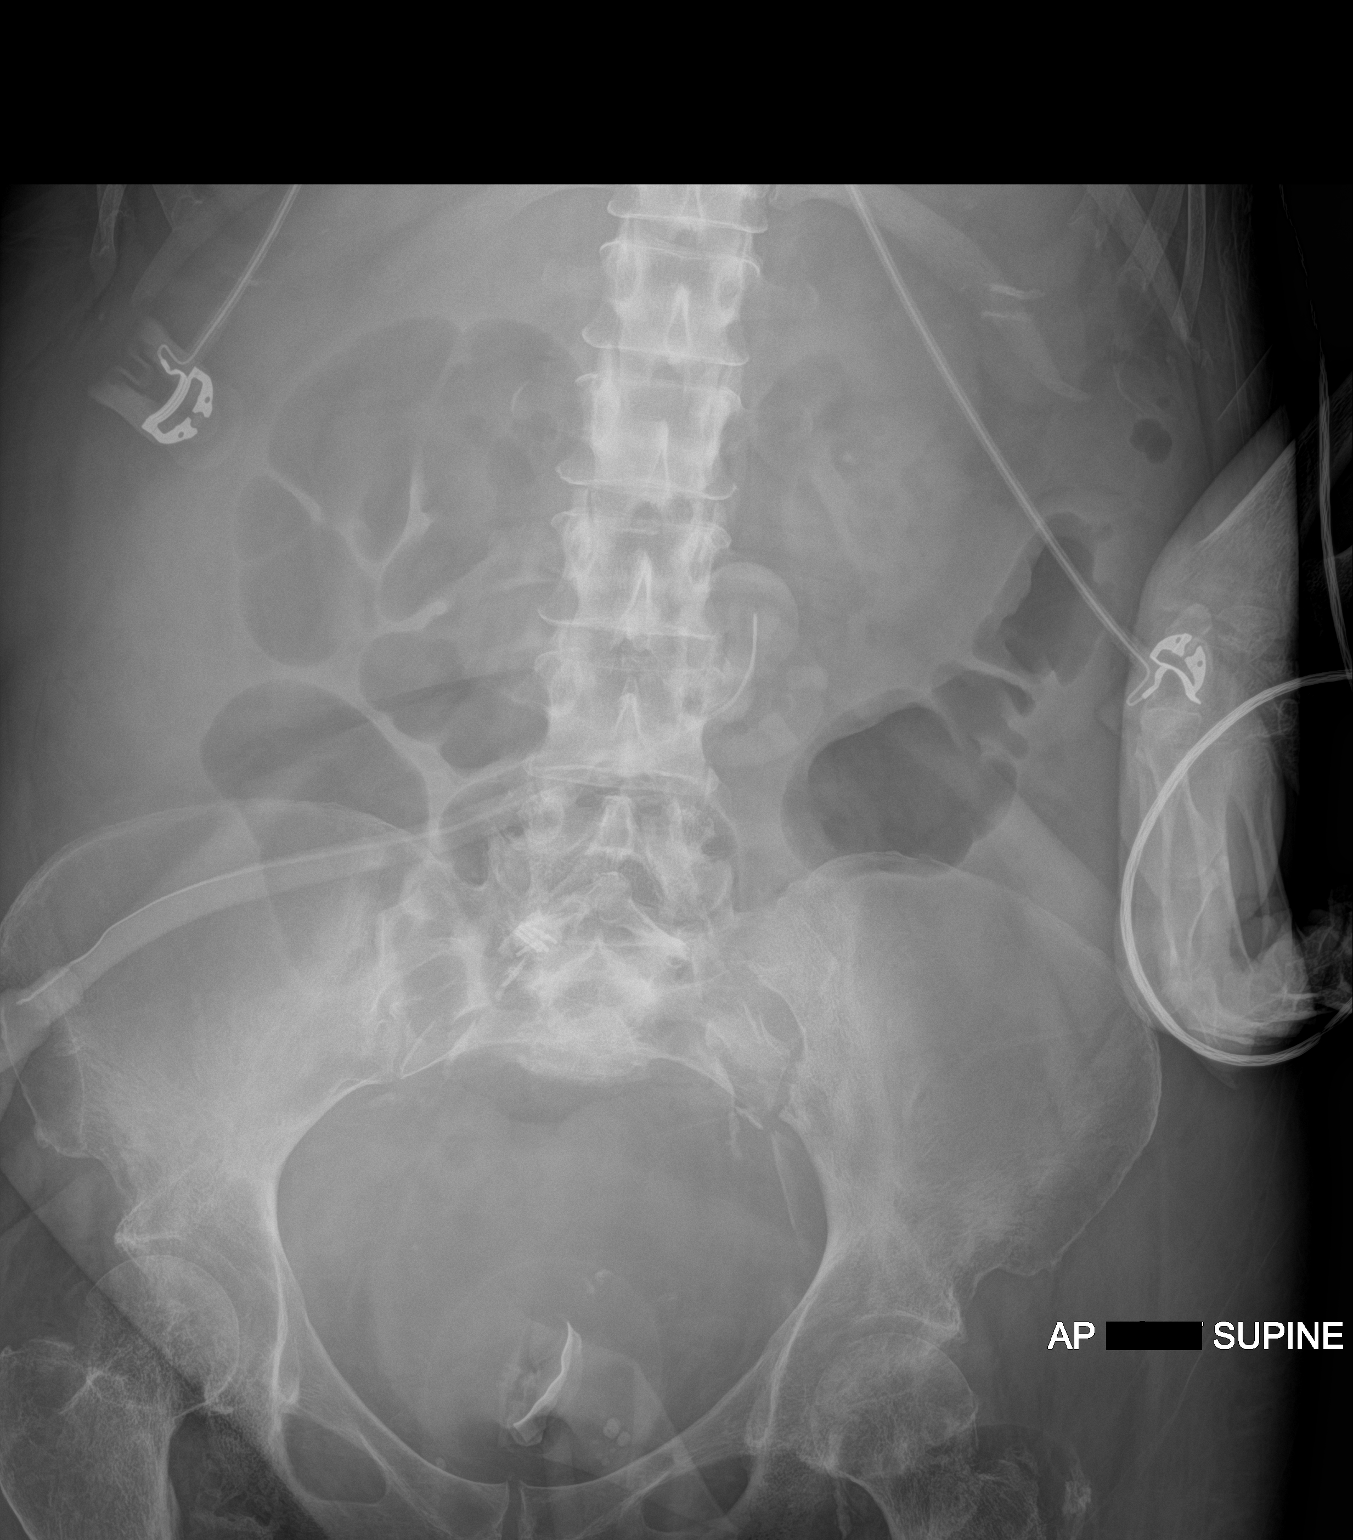

[1 of 1 positions shown; findings below may reference images not displayed]

FINDINGS: No definite suprapubic catheter identified. Radiopaque structure
projecting over the low pelvis may either represent a collection bag
associated with an open stoma, or a rectal bag. Percutaneous
gastrostomy tube projecting over the epigastric region. Gas is noted
in the colon. Paucity of rectal gas. No pathologic dilatation of
small bowel. No gross pneumoperitoneum noted on today's single
supine view.
IMPRESSION: 1. Support apparatus, as above.
2. Nonobstructive bowel gas pattern.

## 2021-02-23 IMAGING — DX DG CHEST 1V PORT
1 series · 1 of 1 positions shown · non-contrast
Comparison: Chest radiograph 01/18/2019

CLINICAL DATA: hypoxemia

EXAM:
PORTABLE CHEST 1 VIEW

[chest ap]
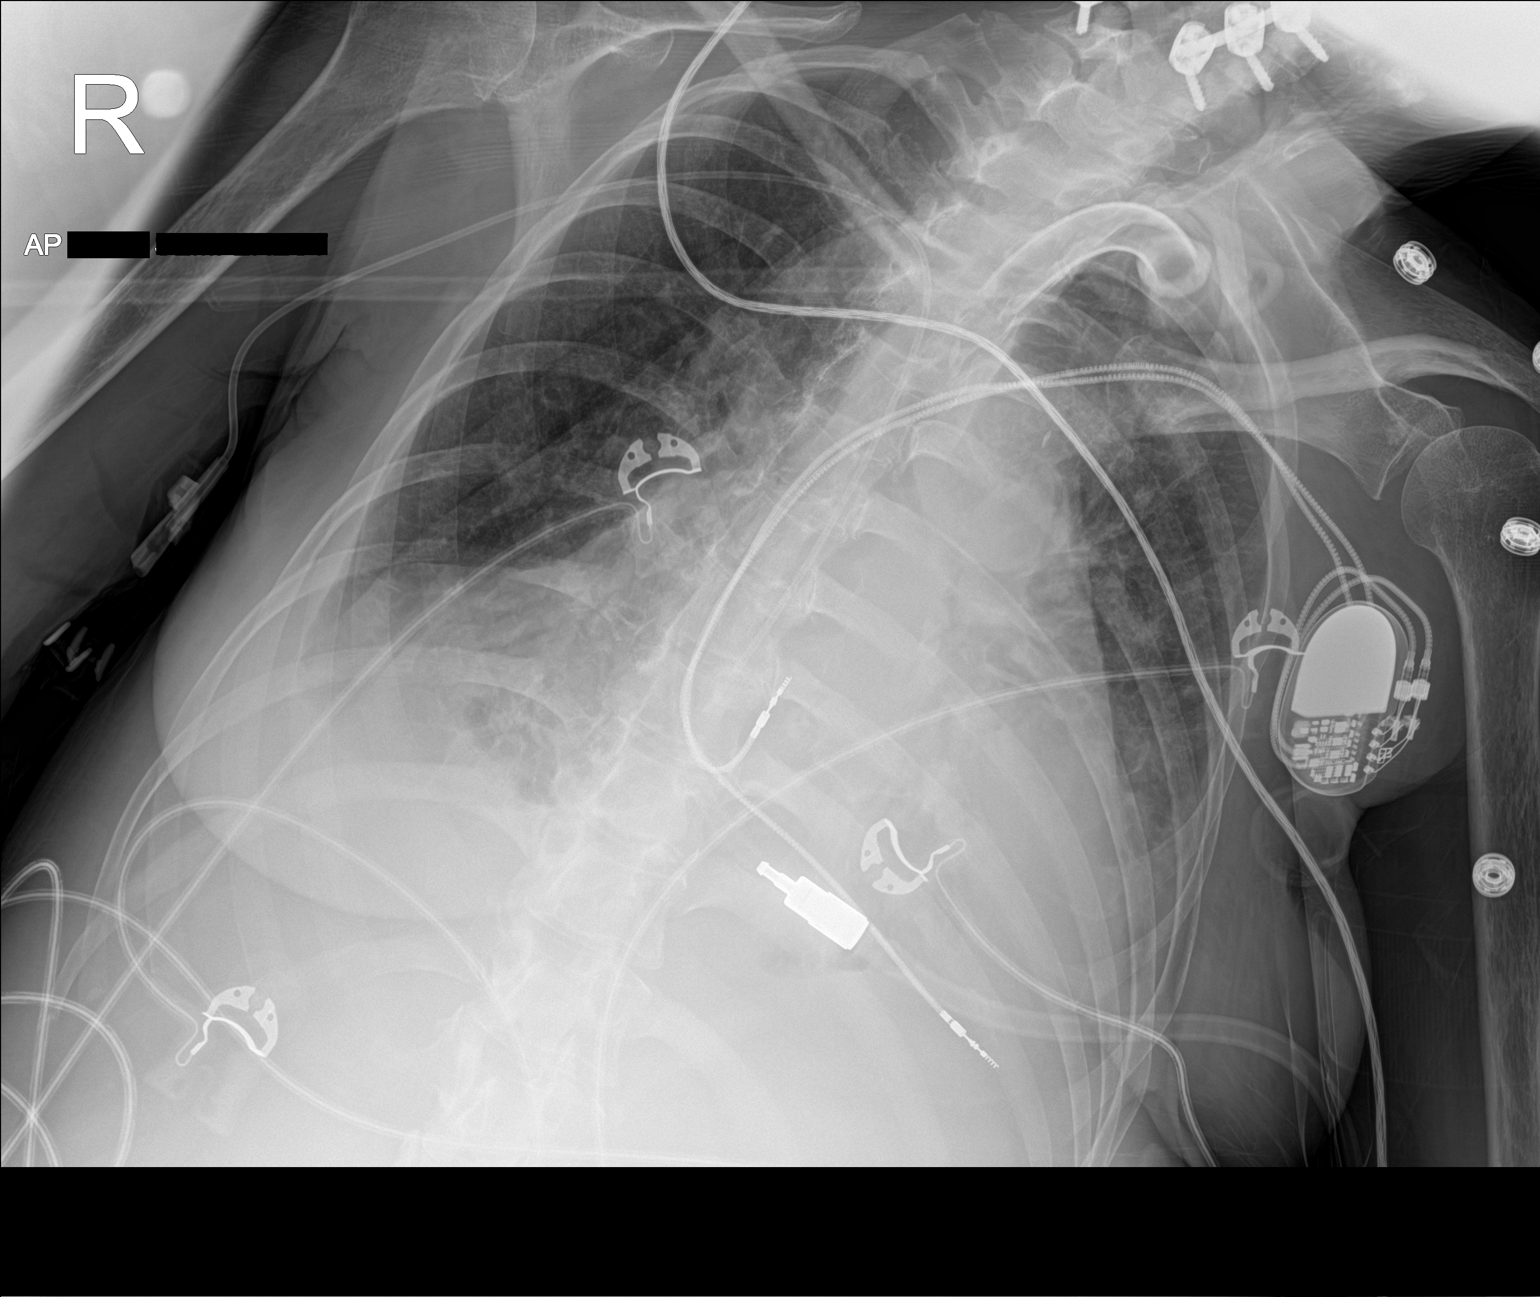

[1 of 1 positions shown; findings below may reference images not displayed]

FINDINGS: Limited study due to patient rotation. Stable cardiomediastinal
contours. Unchanged support apparatus. Persistent small bilateral
pleural effusions and bibasilar opacities which could reflect
atelectasis and or infiltrates. No pneumothorax. No acute finding in
the visualized skeleton.
IMPRESSION: Limited study due to patient rotation but chest appears stable with
bibasilar effusions and opacities which could reflect atelectasis
and/or infiltrates.

## 2021-02-23 IMAGING — NM NM HEPATOBILIARY IMAGE, INC GB
2 series · 12 of 12 positions shown · non-contrast
Comparison: Abdominal ultrasound 01/19/2019.

CLINICAL DATA: Possible sepsis. Infected sacral/decubitus ulcers.
Quadriplegia. [id number].

EXAM:
NUCLEAR MEDICINE HEPATOBILIARY IMAGING
TECHNIQUE: Sequential images of the abdomen were obtained [DATE] minutes
following intravenous administration of radiopharmaceutical.
RADIOPHARMACEUTICALS:  5.1 mCi 8c-11m  Choletec IV

[he hepatobiliary · 4.52mm/px · 6 of 60 frames shown (1 of 2)]
[frame 6/60]
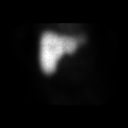
[frame 16/60]
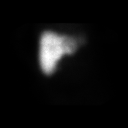
[frame 26/60]
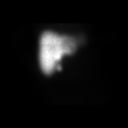
[frame 36/60]
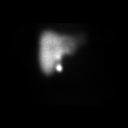
[frame 46/60]
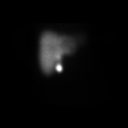
[frame 56/60]
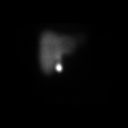

[he hepatobiliary · 4.52mm/px · 6 of 60 frames shown (2 of 2)]
[frame 6/60]
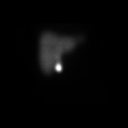
[frame 16/60]
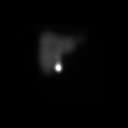
[frame 26/60]
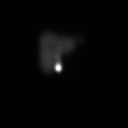
[frame 36/60]
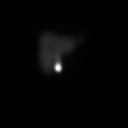
[frame 46/60]
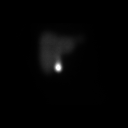
[frame 56/60]
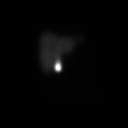

[12 of 12 positions shown; findings below may reference images not displayed]

FINDINGS: Prompt uptake of tracer by the liver. Gallbladder activity is
identified, confirming cystic duct patency.

Despite imaging for 2 hours, common duct and bowel activity are not
identified.
IMPRESSION: 1. No evidence of cystic duct obstruction or acute cholecystitis.
2. Absent common duct or bowel activity, can be seen with biliary
obstruction. Of note, no bile duct dilatation is seen on the
ultrasound of earlier today.

## 2021-02-26 IMAGING — DX DG ABD PORTABLE 1V
2 series · 2 of 2 positions shown · non-contrast
Comparison: 01/19/2019

CLINICAL DATA: Abdominal distension.

EXAM:
PORTABLE ABDOMEN - 1 VIEW

[abdomen kub (1 of 2)]
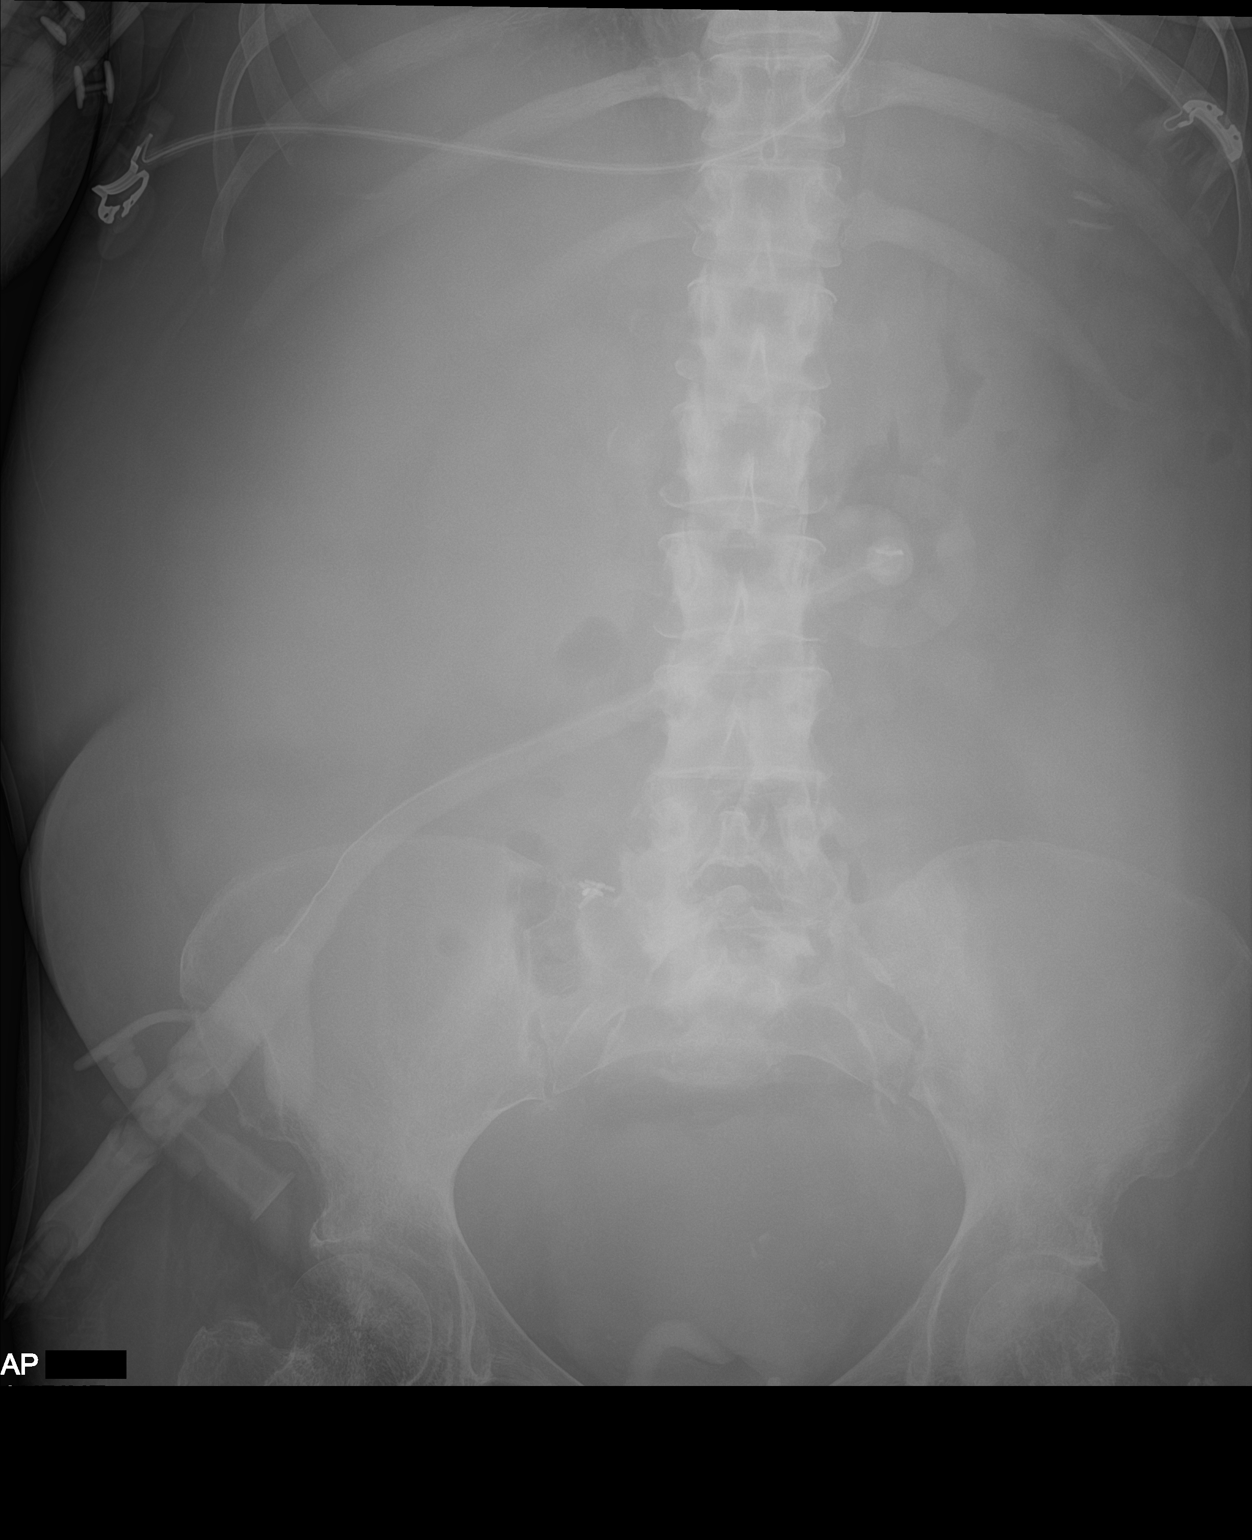

[abdomen kub (2 of 2)]
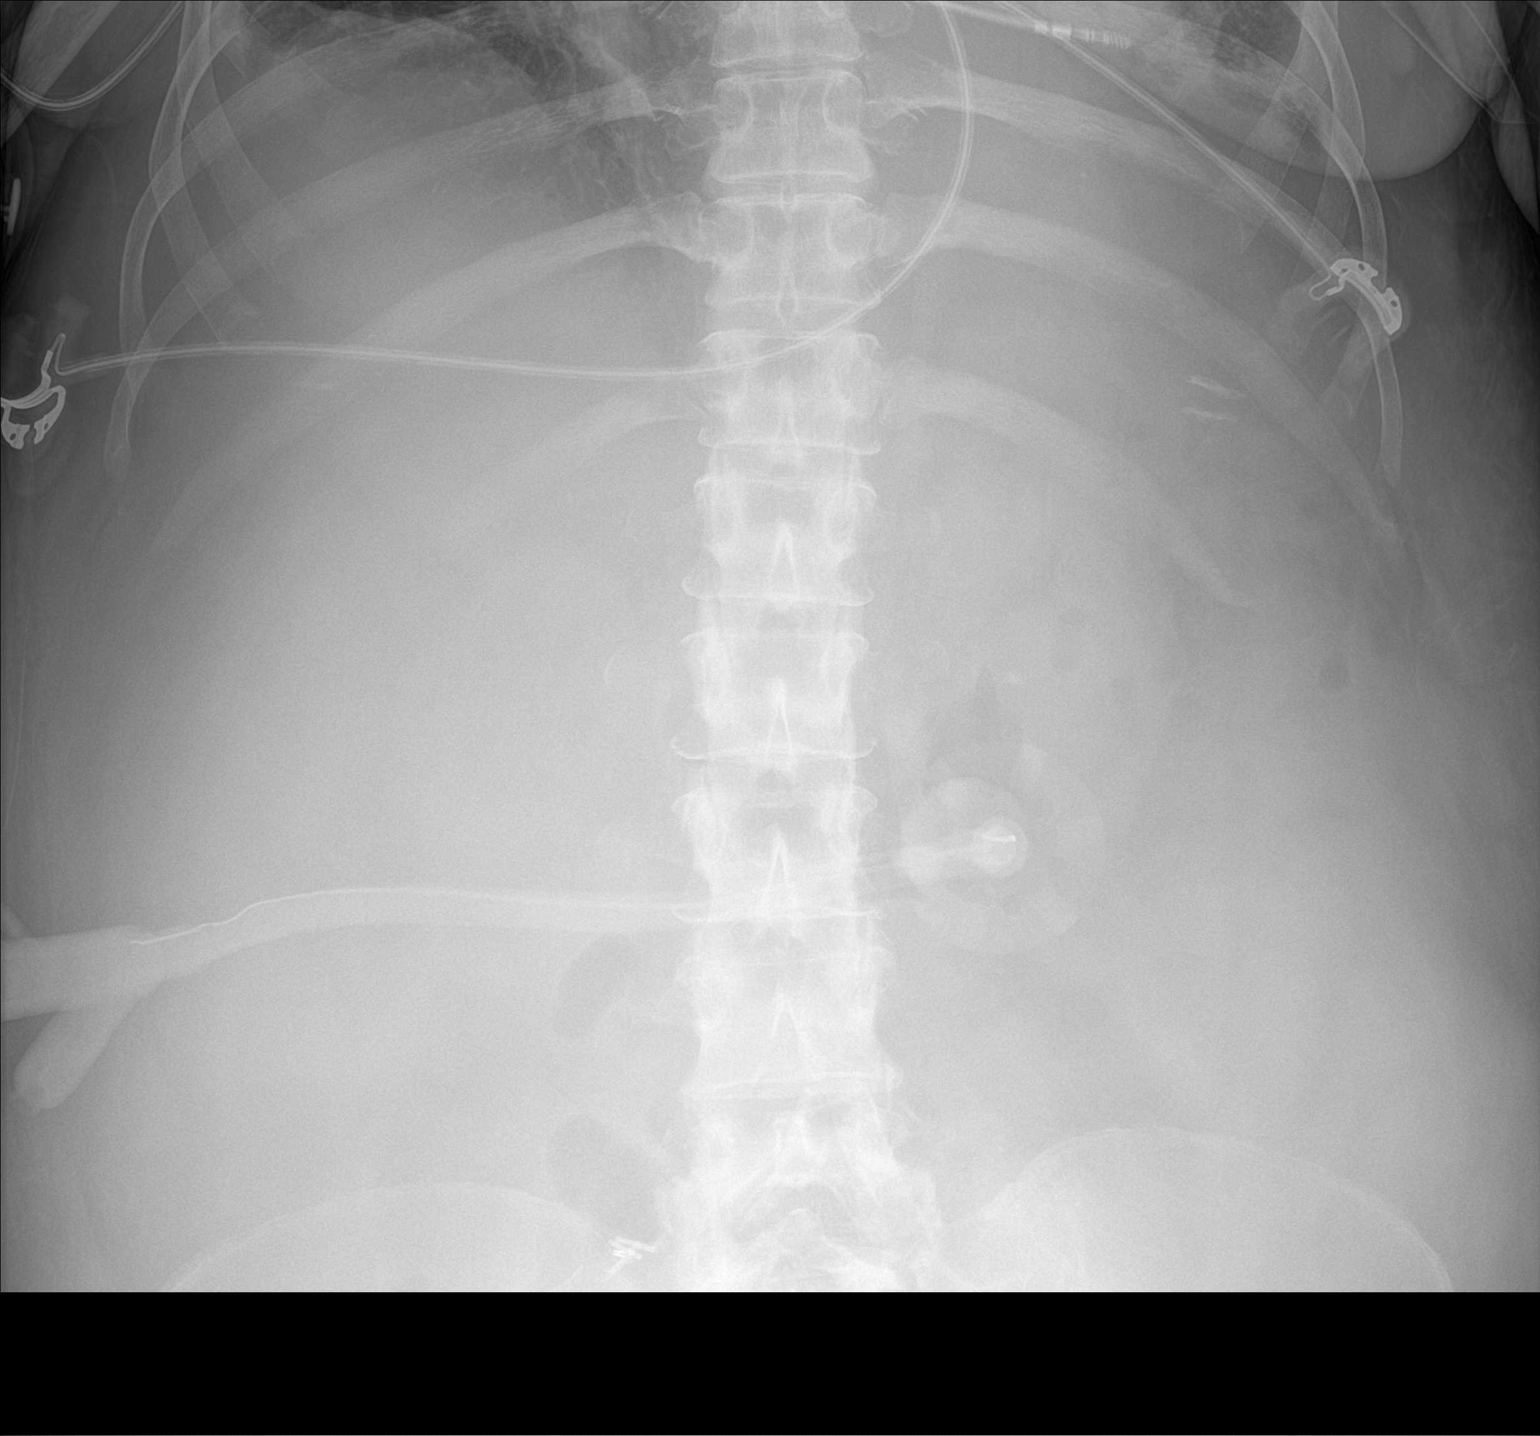

[2 of 2 positions shown; findings below may reference images not displayed]

FINDINGS: Gastrostomy tube tip projects over the left abdomen. Significant
interval increase in abdominal distension with decrease in bowel
gas.
IMPRESSION: Significant increase in abdominal distension with decreased bowel
gas. Suspect underlying ascites. Consider further evaluation with
abdominal sonogram to assess for fluid in the abdomen.

## 2021-02-28 IMAGING — DX DG CHEST 1V PORT
1 series · 1 of 1 positions shown · non-contrast
Comparison: 01/20/2019.  CT 01/17/2019.

CLINICAL DATA: Acute respiratory failure.

EXAM:
PORTABLE CHEST 1 VIEW

[chest ap]
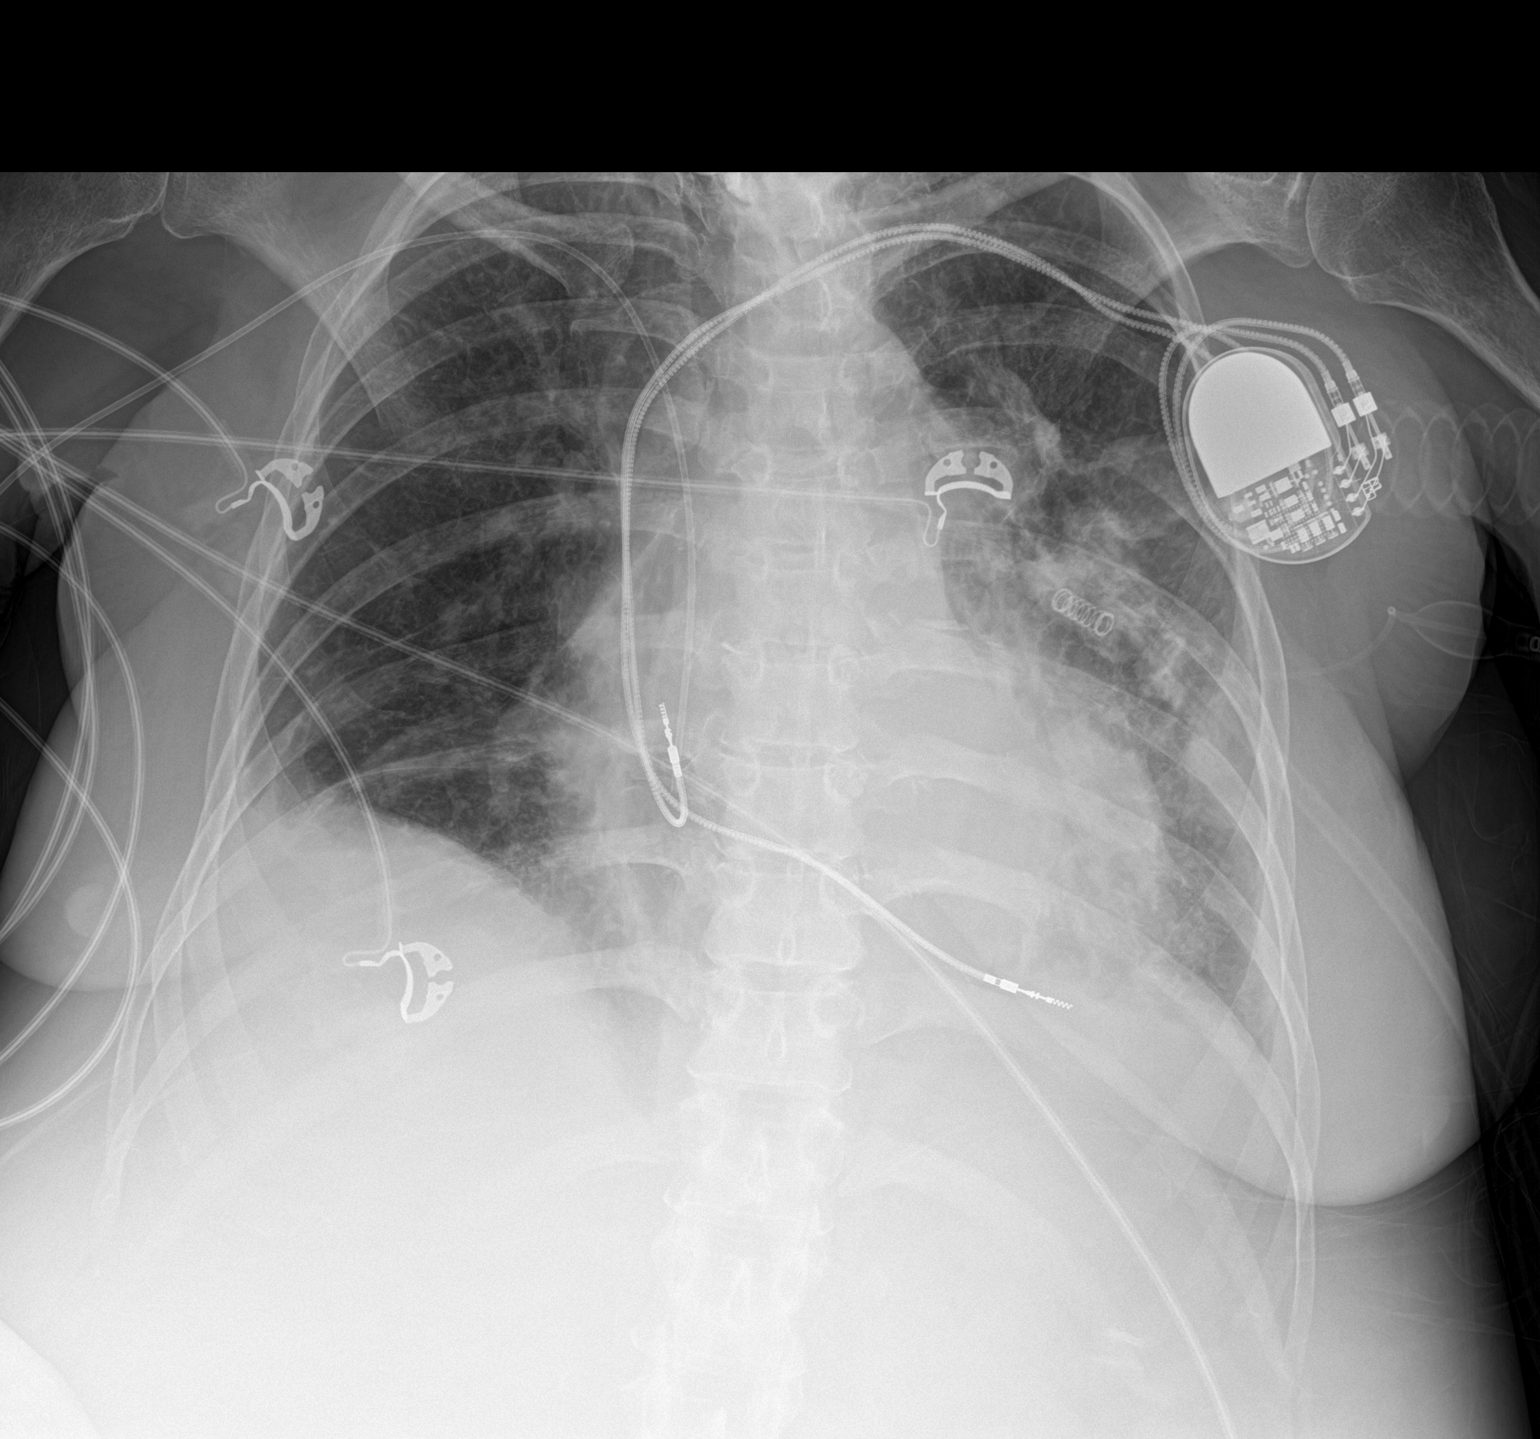

[1 of 1 positions shown; findings below may reference images not displayed]

FINDINGS: Tracheostomy tube in stable position. PICC line in stable position.
Stable cardiomegaly. Persistent bibasilar atelectasis/infiltrates.
Subsegmental atelectasis/infiltrate now noted the left mid lung
field. Tiny right pleural effusion. No pneumothorax.
IMPRESSION: 1.  Tracheostomy tube and PICC line stable position.

2.  Stable cardiomegaly.

3. Persistent bibasilar atelectasis/infiltrates. Subsegmental
atelectasis/infiltrate at now noted in the left mid lung. Tiny right
pleural effusion.

## 2021-03-04 IMAGING — DX DG CHEST 1V PORT
1 series · 1 of 1 positions shown · non-contrast
Comparison: 01/25/2019

CLINICAL DATA: Respiratory failure. [id number] positive.

EXAM:
PORTABLE CHEST 1 VIEW

[chest ap]
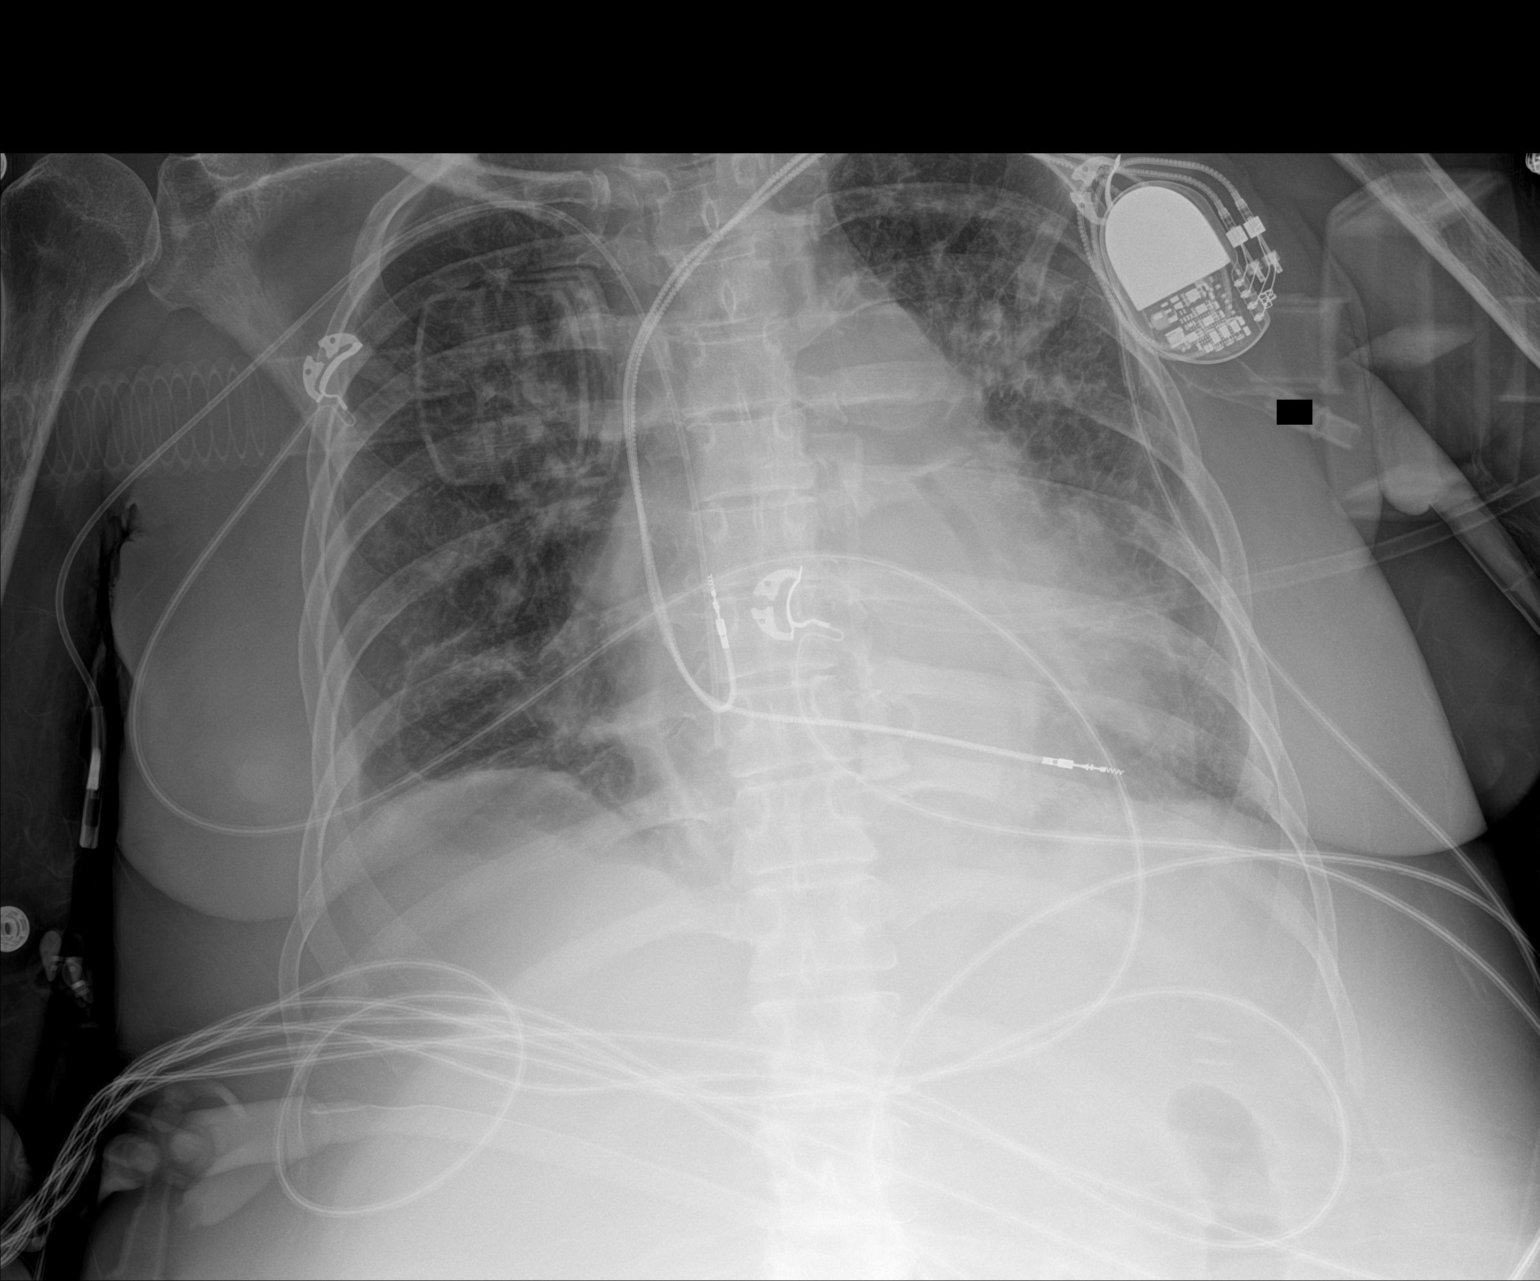

[1 of 1 positions shown; findings below may reference images not displayed]

FINDINGS: Tracheostomy tube overlies the airway. Pacemaker remains in place.
Right PICC terminates over the right atrium. The cardiac silhouette
remains mildly enlarged. Patchy airspace opacities throughout the
left lung and milder opacities in the right perihilar region and
right lung base have mildly worsened. There is a persistent small
right pleural effusion. No pneumothorax is identified.
IMPRESSION: 1. Mild worsening of left greater than right lung airspace disease.
2. Unchanged small right pleural effusion.

## 2021-03-08 IMAGING — DX DG CHEST 1V PORT
1 series · 1 of 1 positions shown · non-contrast
Comparison: January 29, 2019.

CLINICAL DATA: [id number] positive.

EXAM:
PORTABLE CHEST 1 VIEW

[chest ap]
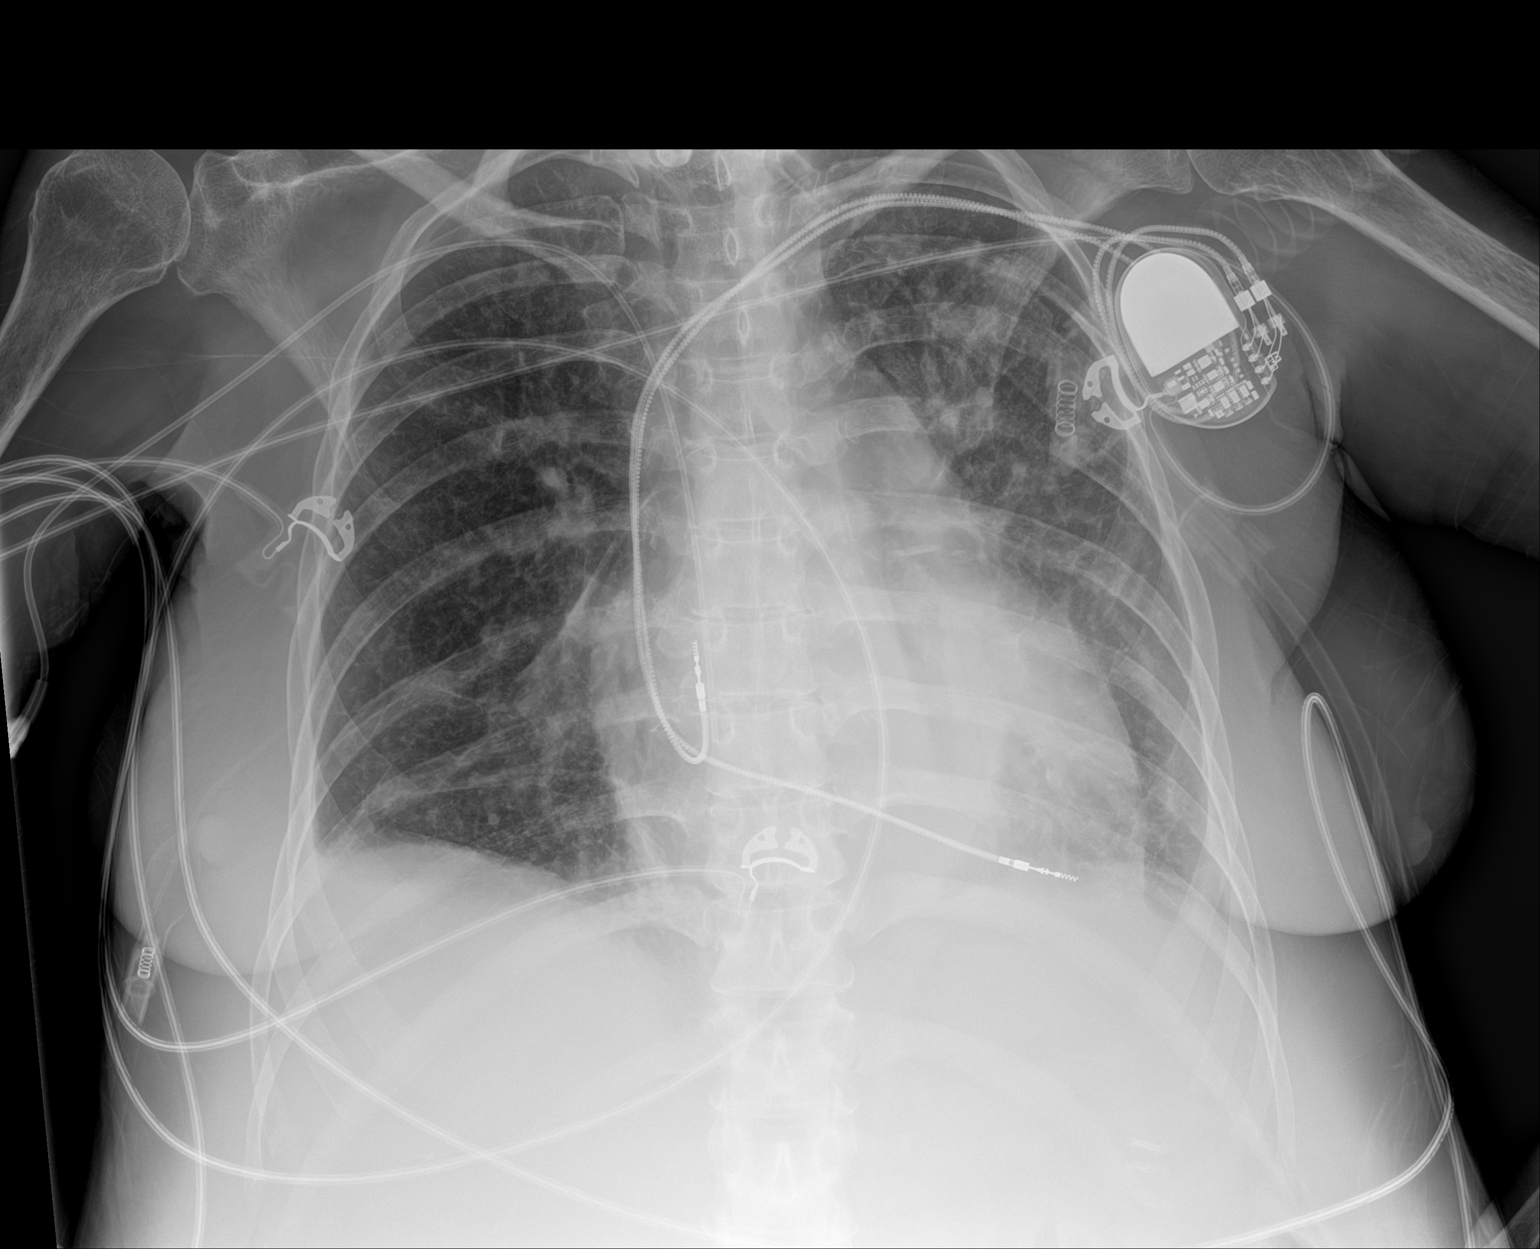

[1 of 1 positions shown; findings below may reference images not displayed]

FINDINGS: Stable cardiomegaly. Tracheostomy tube is unchanged in position.
Left-sided pacemaker is unchanged. Right-sided PICC line appears
normal. No pneumothorax or pleural effusion is noted. Stable mild
patchy airspace opacities are noted bilaterally concerning for
multifocal pneumonia. Bony thorax is unremarkable.
IMPRESSION: Stable mild patchy airspace opacities are noted bilaterally
concerning for multifocal pneumonia. Followup radiographs are
recommended until resolution.
